# Patient Record
Sex: Male | Born: 1970
Health system: Southern US, Community
[De-identification: ages and names within clinical notes are randomized; demographics above are authoritative.]

## PROBLEM LIST (undated history)

## (undated) DIAGNOSIS — C801 Malignant (primary) neoplasm, unspecified: Secondary | ICD-10-CM

## (undated) DIAGNOSIS — I209 Angina pectoris, unspecified: Secondary | ICD-10-CM

## (undated) DIAGNOSIS — F32A Depression, unspecified: Secondary | ICD-10-CM

## (undated) DIAGNOSIS — E119 Type 2 diabetes mellitus without complications: Secondary | ICD-10-CM

## (undated) DIAGNOSIS — Z95810 Presence of automatic (implantable) cardiac defibrillator: Secondary | ICD-10-CM

## (undated) DIAGNOSIS — I2510 Atherosclerotic heart disease of native coronary artery without angina pectoris: Secondary | ICD-10-CM

## (undated) DIAGNOSIS — M1990 Unspecified osteoarthritis, unspecified site: Secondary | ICD-10-CM

## (undated) DIAGNOSIS — E669 Obesity, unspecified: Secondary | ICD-10-CM

## (undated) DIAGNOSIS — F329 Major depressive disorder, single episode, unspecified: Secondary | ICD-10-CM

## (undated) DIAGNOSIS — I219 Acute myocardial infarction, unspecified: Secondary | ICD-10-CM

## (undated) DIAGNOSIS — E785 Hyperlipidemia, unspecified: Secondary | ICD-10-CM

## (undated) DIAGNOSIS — I10 Essential (primary) hypertension: Secondary | ICD-10-CM

## (undated) DIAGNOSIS — I5022 Chronic systolic (congestive) heart failure: Secondary | ICD-10-CM

## (undated) DIAGNOSIS — I509 Heart failure, unspecified: Secondary | ICD-10-CM

## (undated) DIAGNOSIS — M109 Gout, unspecified: Secondary | ICD-10-CM

## (undated) DIAGNOSIS — G4730 Sleep apnea, unspecified: Secondary | ICD-10-CM

## (undated) DIAGNOSIS — F419 Anxiety disorder, unspecified: Secondary | ICD-10-CM

## (undated) DIAGNOSIS — I480 Paroxysmal atrial fibrillation: Secondary | ICD-10-CM

## (undated) DIAGNOSIS — R0602 Shortness of breath: Secondary | ICD-10-CM

## (undated) DIAGNOSIS — M11261 Other chondrocalcinosis, right knee: Secondary | ICD-10-CM

## (undated) DIAGNOSIS — K760 Fatty (change of) liver, not elsewhere classified: Secondary | ICD-10-CM

## (undated) DIAGNOSIS — E66811 Obesity, class 1: Secondary | ICD-10-CM

## (undated) DIAGNOSIS — I420 Dilated cardiomyopathy: Secondary | ICD-10-CM

## (undated) DIAGNOSIS — Z950 Presence of cardiac pacemaker: Secondary | ICD-10-CM

## (undated) DIAGNOSIS — F319 Bipolar disorder, unspecified: Secondary | ICD-10-CM

## (undated) DIAGNOSIS — Z9289 Personal history of other medical treatment: Secondary | ICD-10-CM

## (undated) DIAGNOSIS — K279 Peptic ulcer, site unspecified, unspecified as acute or chronic, without hemorrhage or perforation: Secondary | ICD-10-CM

## (undated) DIAGNOSIS — K219 Gastro-esophageal reflux disease without esophagitis: Secondary | ICD-10-CM

## (undated) DIAGNOSIS — Z87442 Personal history of urinary calculi: Secondary | ICD-10-CM

## (undated) DIAGNOSIS — I499 Cardiac arrhythmia, unspecified: Secondary | ICD-10-CM

## (undated) HISTORY — PX: JOINT REPLACEMENT: SHX530

## (undated) HISTORY — DX: Gout, unspecified: M10.9

## (undated) HISTORY — DX: Atherosclerotic heart disease of native coronary artery without angina pectoris: I25.10

## (undated) HISTORY — PX: CARDIAC PACEMAKER PLACEMENT: SHX583

## (undated) HISTORY — PX: TOTAL KNEE ARTHROPLASTY: SHX125

## (undated) HISTORY — DX: Hyperlipidemia, unspecified: E78.5

## (undated) HISTORY — DX: Essential (primary) hypertension: I10

## (undated) HISTORY — PX: CHOLECYSTECTOMY: SHX55

## (undated) HISTORY — PX: CARDIAC CATHETERIZATION: SHX172

## (undated) HISTORY — PX: COLONOSCOPY: SHX174

## (undated) HISTORY — PX: CORONARY ARTERY BYPASS GRAFT: SHX141

## (undated) HISTORY — DX: Dilated cardiomyopathy: I42.0

## (undated) HISTORY — DX: Type 2 diabetes mellitus without complications: E11.9

## (undated) HISTORY — DX: Personal history of other medical treatment: Z92.89

---

## 1999-06-30 ENCOUNTER — Emergency Department (HOSPITAL_COMMUNITY): Admission: EM | Admit: 1999-06-30 | Discharge: 1999-06-30 | Payer: Self-pay | Admitting: Emergency Medicine

## 2000-08-17 ENCOUNTER — Encounter: Payer: Self-pay | Admitting: Emergency Medicine

## 2000-08-17 ENCOUNTER — Inpatient Hospital Stay (HOSPITAL_COMMUNITY): Admission: EM | Admit: 2000-08-17 | Discharge: 2000-08-19 | Payer: Self-pay | Admitting: Emergency Medicine

## 2001-04-11 ENCOUNTER — Emergency Department (HOSPITAL_COMMUNITY): Admission: EM | Admit: 2001-04-11 | Discharge: 2001-04-12 | Payer: Self-pay | Admitting: Emergency Medicine

## 2001-05-26 ENCOUNTER — Encounter: Payer: Self-pay | Admitting: Emergency Medicine

## 2001-05-26 ENCOUNTER — Emergency Department (HOSPITAL_COMMUNITY): Admission: EM | Admit: 2001-05-26 | Discharge: 2001-05-26 | Payer: Self-pay | Admitting: Emergency Medicine

## 2001-06-14 ENCOUNTER — Emergency Department (HOSPITAL_COMMUNITY): Admission: EM | Admit: 2001-06-14 | Discharge: 2001-06-14 | Payer: Self-pay | Admitting: Emergency Medicine

## 2001-06-24 ENCOUNTER — Emergency Department (HOSPITAL_COMMUNITY): Admission: EM | Admit: 2001-06-24 | Discharge: 2001-06-24 | Payer: Self-pay | Admitting: Emergency Medicine

## 2001-06-24 ENCOUNTER — Encounter: Payer: Self-pay | Admitting: Emergency Medicine

## 2001-07-17 ENCOUNTER — Emergency Department (HOSPITAL_COMMUNITY): Admission: EM | Admit: 2001-07-17 | Discharge: 2001-07-17 | Payer: Self-pay | Admitting: Emergency Medicine

## 2001-07-18 ENCOUNTER — Emergency Department (HOSPITAL_COMMUNITY): Admission: EM | Admit: 2001-07-18 | Discharge: 2001-07-19 | Payer: Self-pay | Admitting: Emergency Medicine

## 2004-10-14 ENCOUNTER — Emergency Department (HOSPITAL_COMMUNITY): Admission: EM | Admit: 2004-10-14 | Discharge: 2004-10-14 | Payer: Self-pay | Admitting: Emergency Medicine

## 2004-10-23 ENCOUNTER — Emergency Department (HOSPITAL_COMMUNITY): Admission: EM | Admit: 2004-10-23 | Discharge: 2004-10-23 | Payer: Self-pay | Admitting: Emergency Medicine

## 2004-10-24 ENCOUNTER — Emergency Department (HOSPITAL_COMMUNITY): Admission: EM | Admit: 2004-10-24 | Discharge: 2004-10-24 | Payer: Self-pay | Admitting: Emergency Medicine

## 2004-11-13 ENCOUNTER — Emergency Department (HOSPITAL_COMMUNITY): Admission: EM | Admit: 2004-11-13 | Discharge: 2004-11-13 | Payer: Self-pay | Admitting: Emergency Medicine

## 2005-01-28 DIAGNOSIS — Z95810 Presence of automatic (implantable) cardiac defibrillator: Secondary | ICD-10-CM

## 2005-01-28 HISTORY — DX: Presence of automatic (implantable) cardiac defibrillator: Z95.810

## 2007-06-22 ENCOUNTER — Emergency Department (HOSPITAL_COMMUNITY): Admission: EM | Admit: 2007-06-22 | Discharge: 2007-06-22 | Payer: Self-pay | Admitting: Emergency Medicine

## 2008-03-08 ENCOUNTER — Inpatient Hospital Stay (HOSPITAL_COMMUNITY): Admission: EM | Admit: 2008-03-08 | Discharge: 2008-03-11 | Payer: Self-pay | Admitting: Emergency Medicine

## 2008-03-08 ENCOUNTER — Ambulatory Visit: Payer: Self-pay | Admitting: Internal Medicine

## 2008-03-08 ENCOUNTER — Ambulatory Visit: Payer: Self-pay | Admitting: Pulmonary Disease

## 2008-03-08 ENCOUNTER — Encounter (INDEPENDENT_AMBULATORY_CARE_PROVIDER_SITE_OTHER): Payer: Self-pay | Admitting: Internal Medicine

## 2008-03-09 ENCOUNTER — Encounter (INDEPENDENT_AMBULATORY_CARE_PROVIDER_SITE_OTHER): Payer: Self-pay | Admitting: *Deleted

## 2008-03-09 ENCOUNTER — Ambulatory Visit: Payer: Self-pay | Admitting: Vascular Surgery

## 2008-03-10 ENCOUNTER — Encounter (INDEPENDENT_AMBULATORY_CARE_PROVIDER_SITE_OTHER): Payer: Self-pay | Admitting: *Deleted

## 2008-03-10 LAB — CONVERTED CEMR LAB: Hgb A1c MFr Bld: 9.1 %

## 2008-03-11 ENCOUNTER — Encounter (INDEPENDENT_AMBULATORY_CARE_PROVIDER_SITE_OTHER): Payer: Self-pay | Admitting: Internal Medicine

## 2008-03-11 DIAGNOSIS — I5022 Chronic systolic (congestive) heart failure: Secondary | ICD-10-CM

## 2008-03-11 DIAGNOSIS — R578 Other shock: Secondary | ICD-10-CM

## 2008-03-11 DIAGNOSIS — I10 Essential (primary) hypertension: Secondary | ICD-10-CM

## 2008-03-11 DIAGNOSIS — M109 Gout, unspecified: Secondary | ICD-10-CM

## 2008-03-11 DIAGNOSIS — E785 Hyperlipidemia, unspecified: Secondary | ICD-10-CM

## 2008-03-11 DIAGNOSIS — R570 Cardiogenic shock: Secondary | ICD-10-CM | POA: Insufficient documentation

## 2008-03-14 ENCOUNTER — Encounter (INDEPENDENT_AMBULATORY_CARE_PROVIDER_SITE_OTHER): Payer: Self-pay | Admitting: Internal Medicine

## 2008-03-14 ENCOUNTER — Ambulatory Visit: Payer: Self-pay | Admitting: Internal Medicine

## 2008-03-15 ENCOUNTER — Encounter (INDEPENDENT_AMBULATORY_CARE_PROVIDER_SITE_OTHER): Payer: Self-pay | Admitting: Internal Medicine

## 2008-03-15 ENCOUNTER — Ambulatory Visit: Payer: Self-pay | Admitting: Internal Medicine

## 2008-03-15 DIAGNOSIS — E872 Acidosis, unspecified: Secondary | ICD-10-CM | POA: Insufficient documentation

## 2008-03-15 LAB — CONVERTED CEMR LAB
BUN: 19 mg/dL (ref 6–23)
Calcium: 10.1 mg/dL (ref 8.4–10.5)
Creatinine, Ser: 0.81 mg/dL (ref 0.40–1.50)
Creatinine, Ser: 0.87 mg/dL (ref 0.40–1.50)
Digitoxin Lvl: 1 ng/mL (ref 0.8–2.0)
Glucose, Bld: 148 mg/dL — ABNORMAL HIGH (ref 70–99)
Sodium: 136 meq/L (ref 135–145)

## 2008-03-18 ENCOUNTER — Ambulatory Visit: Payer: Self-pay | Admitting: Internal Medicine

## 2008-03-18 ENCOUNTER — Encounter (INDEPENDENT_AMBULATORY_CARE_PROVIDER_SITE_OTHER): Payer: Self-pay | Admitting: Internal Medicine

## 2008-03-18 LAB — CONVERTED CEMR LAB
Blood Glucose, Fingerstick: 170
CO2: 22 meq/L (ref 19–32)
Sodium: 140 meq/L (ref 135–145)

## 2008-03-23 ENCOUNTER — Ambulatory Visit: Payer: Self-pay | Admitting: Internal Medicine

## 2008-03-25 ENCOUNTER — Emergency Department (HOSPITAL_COMMUNITY): Admission: EM | Admit: 2008-03-25 | Discharge: 2008-03-25 | Payer: Self-pay | Admitting: Emergency Medicine

## 2008-03-28 ENCOUNTER — Encounter (INDEPENDENT_AMBULATORY_CARE_PROVIDER_SITE_OTHER): Payer: Self-pay | Admitting: Internal Medicine

## 2008-03-28 ENCOUNTER — Emergency Department (HOSPITAL_COMMUNITY): Admission: EM | Admit: 2008-03-28 | Discharge: 2008-03-28 | Payer: Self-pay | Admitting: Emergency Medicine

## 2008-03-28 ENCOUNTER — Telehealth: Payer: Self-pay | Admitting: *Deleted

## 2008-03-29 DIAGNOSIS — I319 Disease of pericardium, unspecified: Secondary | ICD-10-CM | POA: Insufficient documentation

## 2008-03-29 DIAGNOSIS — R319 Hematuria, unspecified: Secondary | ICD-10-CM

## 2008-03-30 ENCOUNTER — Ambulatory Visit: Payer: Self-pay | Admitting: Internal Medicine

## 2008-03-30 ENCOUNTER — Ambulatory Visit: Payer: Self-pay | Admitting: *Deleted

## 2008-03-30 ENCOUNTER — Encounter (INDEPENDENT_AMBULATORY_CARE_PROVIDER_SITE_OTHER): Payer: Self-pay | Admitting: Internal Medicine

## 2008-03-30 ENCOUNTER — Encounter (INDEPENDENT_AMBULATORY_CARE_PROVIDER_SITE_OTHER): Payer: Self-pay | Admitting: *Deleted

## 2008-03-30 LAB — CONVERTED CEMR LAB
Blood Glucose, Fingerstick: 169
Blood Glucose, Home Monitor: 1 mg/dL

## 2008-03-31 LAB — CONVERTED CEMR LAB
Cholesterol: 198 mg/dL (ref 0–200)
HDL: 8 mg/dL — ABNORMAL LOW (ref >39)
Total CHOL/HDL Ratio: 24.8
VLDL: 51 mg/dL — ABNORMAL HIGH (ref 0–40)

## 2008-04-04 ENCOUNTER — Encounter: Payer: Self-pay | Admitting: Internal Medicine

## 2008-05-02 ENCOUNTER — Telehealth (INDEPENDENT_AMBULATORY_CARE_PROVIDER_SITE_OTHER): Payer: Self-pay | Admitting: Internal Medicine

## 2008-06-07 ENCOUNTER — Telehealth: Payer: Self-pay | Admitting: Internal Medicine

## 2008-06-24 ENCOUNTER — Encounter: Payer: Self-pay | Admitting: Internal Medicine

## 2008-08-18 ENCOUNTER — Telehealth (INDEPENDENT_AMBULATORY_CARE_PROVIDER_SITE_OTHER): Payer: Self-pay | Admitting: Internal Medicine

## 2008-08-22 ENCOUNTER — Encounter: Payer: Self-pay | Admitting: Internal Medicine

## 2008-09-06 ENCOUNTER — Telehealth: Payer: Self-pay | Admitting: *Deleted

## 2008-09-13 ENCOUNTER — Ambulatory Visit: Payer: Self-pay | Admitting: Internal Medicine

## 2008-09-13 LAB — CONVERTED CEMR LAB
BUN: 11 mg/dL (ref 6–23)
Blood Glucose, AC Bkfst: 378 mg/dL
CO2: 29 meq/L (ref 19–32)
Calcium: 8.7 mg/dL (ref 8.4–10.5)
Glucose, Bld: 376 mg/dL — ABNORMAL HIGH (ref 70–99)

## 2008-09-23 ENCOUNTER — Ambulatory Visit: Payer: Self-pay | Admitting: Internal Medicine

## 2008-09-23 LAB — CONVERTED CEMR LAB: Blood Glucose, Fingerstick: 336

## 2008-09-27 ENCOUNTER — Encounter (INDEPENDENT_AMBULATORY_CARE_PROVIDER_SITE_OTHER): Payer: Self-pay | Admitting: *Deleted

## 2008-10-27 ENCOUNTER — Encounter (INDEPENDENT_AMBULATORY_CARE_PROVIDER_SITE_OTHER): Payer: Self-pay | Admitting: *Deleted

## 2008-11-10 ENCOUNTER — Ambulatory Visit: Payer: Self-pay | Admitting: Internal Medicine

## 2008-11-10 ENCOUNTER — Encounter (INDEPENDENT_AMBULATORY_CARE_PROVIDER_SITE_OTHER): Payer: Self-pay | Admitting: Internal Medicine

## 2008-11-10 LAB — CONVERTED CEMR LAB
Blood Glucose, Fingerstick: 396
Blood Glucose, Home Monitor: 5 mg/dL

## 2008-11-11 ENCOUNTER — Telehealth (INDEPENDENT_AMBULATORY_CARE_PROVIDER_SITE_OTHER): Payer: Self-pay | Admitting: Internal Medicine

## 2008-11-11 DIAGNOSIS — E781 Pure hyperglyceridemia: Secondary | ICD-10-CM

## 2008-11-11 LAB — CONVERTED CEMR LAB
Alkaline Phosphatase: 102 units/L (ref 39–117)
Calcium: 8.9 mg/dL (ref 8.4–10.5)
Chloride: 99 meq/L (ref 96–112)
Creatinine, Ser: 0.93 mg/dL (ref 0.40–1.50)
HDL: 26 mg/dL — ABNORMAL LOW (ref >39)
Indirect Bilirubin: 0.5 mg/dL (ref 0.0–0.9)
Total CHOL/HDL Ratio: 7
Total Protein: 7.3 g/dL (ref 6.0–8.3)
Triglycerides: 510 mg/dL — ABNORMAL HIGH (ref <150)

## 2008-11-16 DIAGNOSIS — O99810 Abnormal glucose complicating pregnancy: Secondary | ICD-10-CM | POA: Insufficient documentation

## 2008-11-16 DIAGNOSIS — I2510 Atherosclerotic heart disease of native coronary artery without angina pectoris: Secondary | ICD-10-CM | POA: Insufficient documentation

## 2008-11-22 ENCOUNTER — Telehealth (INDEPENDENT_AMBULATORY_CARE_PROVIDER_SITE_OTHER): Payer: Self-pay | Admitting: *Deleted

## 2008-11-23 ENCOUNTER — Telehealth: Payer: Self-pay | Admitting: Internal Medicine

## 2008-11-23 ENCOUNTER — Encounter: Payer: Self-pay | Admitting: Internal Medicine

## 2008-11-24 ENCOUNTER — Ambulatory Visit: Payer: Self-pay | Admitting: Internal Medicine

## 2008-11-24 ENCOUNTER — Encounter (INDEPENDENT_AMBULATORY_CARE_PROVIDER_SITE_OTHER): Payer: Self-pay | Admitting: Internal Medicine

## 2008-11-25 DIAGNOSIS — Z95810 Presence of automatic (implantable) cardiac defibrillator: Secondary | ICD-10-CM | POA: Insufficient documentation

## 2008-11-28 ENCOUNTER — Telehealth (INDEPENDENT_AMBULATORY_CARE_PROVIDER_SITE_OTHER): Payer: Self-pay | Admitting: *Deleted

## 2008-12-02 ENCOUNTER — Ambulatory Visit (HOSPITAL_COMMUNITY): Admission: RE | Admit: 2008-12-02 | Discharge: 2008-12-02 | Payer: Self-pay | Admitting: Internal Medicine

## 2008-12-02 ENCOUNTER — Ambulatory Visit: Payer: Self-pay | Admitting: Cardiology

## 2008-12-02 ENCOUNTER — Ambulatory Visit: Payer: Self-pay

## 2008-12-02 ENCOUNTER — Encounter: Payer: Self-pay | Admitting: Internal Medicine

## 2008-12-05 ENCOUNTER — Ambulatory Visit: Payer: Self-pay | Admitting: Internal Medicine

## 2008-12-05 DIAGNOSIS — I255 Ischemic cardiomyopathy: Secondary | ICD-10-CM

## 2008-12-05 LAB — CONVERTED CEMR LAB
Blood Glucose, Fingerstick: 397
Hgb A1c MFr Bld: 10.7 %

## 2008-12-13 ENCOUNTER — Telehealth: Payer: Self-pay | Admitting: Internal Medicine

## 2009-01-28 DIAGNOSIS — K760 Fatty (change of) liver, not elsewhere classified: Secondary | ICD-10-CM

## 2009-01-28 HISTORY — DX: Fatty (change of) liver, not elsewhere classified: K76.0

## 2009-02-21 ENCOUNTER — Encounter (INDEPENDENT_AMBULATORY_CARE_PROVIDER_SITE_OTHER): Payer: Self-pay | Admitting: Internal Medicine

## 2009-02-21 ENCOUNTER — Ambulatory Visit: Payer: Self-pay | Admitting: Internal Medicine

## 2009-02-21 ENCOUNTER — Inpatient Hospital Stay (HOSPITAL_COMMUNITY): Admission: EM | Admit: 2009-02-21 | Discharge: 2009-02-23 | Payer: Self-pay | Admitting: Emergency Medicine

## 2009-02-21 ENCOUNTER — Ambulatory Visit: Payer: Self-pay | Admitting: Cardiology

## 2009-02-22 ENCOUNTER — Encounter: Payer: Self-pay | Admitting: Internal Medicine

## 2009-02-25 ENCOUNTER — Encounter (INDEPENDENT_AMBULATORY_CARE_PROVIDER_SITE_OTHER): Payer: Self-pay | Admitting: Internal Medicine

## 2009-03-01 ENCOUNTER — Telehealth: Payer: Self-pay | Admitting: Internal Medicine

## 2009-03-28 ENCOUNTER — Telehealth (INDEPENDENT_AMBULATORY_CARE_PROVIDER_SITE_OTHER): Payer: Self-pay | Admitting: Internal Medicine

## 2009-04-28 ENCOUNTER — Telehealth (INDEPENDENT_AMBULATORY_CARE_PROVIDER_SITE_OTHER): Payer: Self-pay | Admitting: Internal Medicine

## 2009-06-02 ENCOUNTER — Emergency Department (HOSPITAL_COMMUNITY): Admission: EM | Admit: 2009-06-02 | Discharge: 2009-06-02 | Payer: Self-pay | Admitting: Emergency Medicine

## 2009-06-19 ENCOUNTER — Telehealth (INDEPENDENT_AMBULATORY_CARE_PROVIDER_SITE_OTHER): Payer: Self-pay | Admitting: *Deleted

## 2009-06-21 ENCOUNTER — Telehealth: Payer: Self-pay | Admitting: *Deleted

## 2009-08-24 ENCOUNTER — Inpatient Hospital Stay (HOSPITAL_COMMUNITY): Admission: EM | Admit: 2009-08-24 | Discharge: 2009-08-28 | Payer: Self-pay | Admitting: Emergency Medicine

## 2009-08-24 ENCOUNTER — Ambulatory Visit: Payer: Self-pay | Admitting: Internal Medicine

## 2009-09-01 ENCOUNTER — Ambulatory Visit: Payer: Self-pay | Admitting: Cardiovascular Disease

## 2009-09-01 ENCOUNTER — Telehealth: Payer: Self-pay | Admitting: Internal Medicine

## 2009-09-01 LAB — CONVERTED CEMR LAB: POC INR: 3

## 2009-09-05 ENCOUNTER — Telehealth: Payer: Self-pay | Admitting: Internal Medicine

## 2009-09-05 ENCOUNTER — Emergency Department (HOSPITAL_COMMUNITY): Admission: EM | Admit: 2009-09-05 | Discharge: 2009-09-05 | Payer: Self-pay | Admitting: Emergency Medicine

## 2009-09-06 ENCOUNTER — Ambulatory Visit: Payer: Self-pay | Admitting: Internal Medicine

## 2009-09-08 ENCOUNTER — Ambulatory Visit: Payer: Self-pay | Admitting: Internal Medicine

## 2009-09-08 LAB — CONVERTED CEMR LAB: POC INR: 4.8

## 2009-09-11 ENCOUNTER — Telehealth: Payer: Self-pay | Admitting: *Deleted

## 2009-09-13 ENCOUNTER — Ambulatory Visit: Payer: Self-pay | Admitting: Internal Medicine

## 2009-09-13 LAB — CONVERTED CEMR LAB
Calcium: 9 mg/dL (ref 8.4–10.5)
Potassium: 4 meq/L (ref 3.5–5.3)
Sodium: 134 meq/L — ABNORMAL LOW (ref 135–145)

## 2009-09-14 ENCOUNTER — Telehealth: Payer: Self-pay | Admitting: Internal Medicine

## 2009-09-15 ENCOUNTER — Ambulatory Visit: Payer: Self-pay | Admitting: Cardiology

## 2009-09-15 LAB — CONVERTED CEMR LAB: POC INR: 2.2

## 2009-09-22 ENCOUNTER — Ambulatory Visit: Payer: Self-pay | Admitting: Cardiology

## 2009-09-22 LAB — CONVERTED CEMR LAB: POC INR: 2.1

## 2009-10-05 ENCOUNTER — Encounter: Payer: Self-pay | Admitting: Internal Medicine

## 2009-10-05 ENCOUNTER — Ambulatory Visit: Payer: Self-pay | Admitting: Internal Medicine

## 2009-10-05 ENCOUNTER — Telehealth: Payer: Self-pay | Admitting: Internal Medicine

## 2009-10-12 ENCOUNTER — Ambulatory Visit: Payer: Self-pay | Admitting: Internal Medicine

## 2009-10-12 ENCOUNTER — Ambulatory Visit: Payer: Self-pay | Admitting: Cardiology

## 2009-10-12 DIAGNOSIS — M109 Gout, unspecified: Secondary | ICD-10-CM | POA: Insufficient documentation

## 2009-10-12 LAB — CONVERTED CEMR LAB
Hgb A1c MFr Bld: 10.5 %
POC INR: 3.2

## 2009-10-19 ENCOUNTER — Ambulatory Visit: Payer: Self-pay | Admitting: Internal Medicine

## 2009-10-19 DIAGNOSIS — E669 Obesity, unspecified: Secondary | ICD-10-CM | POA: Insufficient documentation

## 2009-10-19 LAB — HM DIABETES FOOT EXAM

## 2009-10-20 LAB — CONVERTED CEMR LAB
ALT: 90 units/L — ABNORMAL HIGH (ref 0–53)
Albumin: 4.1 g/dL (ref 3.5–5.2)
Alkaline Phosphatase: 75 units/L (ref 39–117)
Glucose, Bld: 159 mg/dL — ABNORMAL HIGH (ref 70–99)
Potassium: 3.7 meq/L (ref 3.5–5.3)
Sodium: 138 meq/L (ref 135–145)
Testosterone: 205.17 ng/dL — ABNORMAL LOW (ref 350–890)
Total Protein: 6.2 g/dL (ref 6.0–8.3)
Triglycerides: 944 mg/dL — ABNORMAL HIGH (ref <150)

## 2009-10-25 ENCOUNTER — Ambulatory Visit: Payer: Self-pay | Admitting: Internal Medicine

## 2009-10-25 LAB — CONVERTED CEMR LAB: POC INR: 2.8

## 2009-12-13 ENCOUNTER — Inpatient Hospital Stay (HOSPITAL_COMMUNITY): Admission: EM | Admit: 2009-12-13 | Discharge: 2009-12-16 | Payer: Self-pay | Admitting: Emergency Medicine

## 2009-12-13 ENCOUNTER — Ambulatory Visit: Payer: Self-pay | Admitting: Cardiology

## 2009-12-18 ENCOUNTER — Telehealth: Payer: Self-pay | Admitting: Internal Medicine

## 2009-12-18 ENCOUNTER — Ambulatory Visit: Payer: Self-pay | Admitting: Cardiovascular Disease

## 2009-12-18 ENCOUNTER — Ambulatory Visit: Payer: Self-pay | Admitting: Internal Medicine

## 2009-12-18 DIAGNOSIS — I4891 Unspecified atrial fibrillation: Secondary | ICD-10-CM | POA: Insufficient documentation

## 2009-12-18 LAB — CONVERTED CEMR LAB
CO2: 28 meq/L (ref 19–32)
Calcium: 8.9 mg/dL (ref 8.4–10.5)
GFR calc non Af Amer: 89.61 mL/min (ref >60)
POC INR: 3.3
Sodium: 138 meq/L (ref 135–145)

## 2009-12-19 ENCOUNTER — Encounter: Payer: Self-pay | Admitting: Internal Medicine

## 2010-01-16 ENCOUNTER — Ambulatory Visit: Payer: Self-pay

## 2010-01-16 ENCOUNTER — Ambulatory Visit: Payer: Self-pay | Admitting: Internal Medicine

## 2010-02-05 ENCOUNTER — Encounter (INDEPENDENT_AMBULATORY_CARE_PROVIDER_SITE_OTHER): Payer: Self-pay | Admitting: *Deleted

## 2010-02-22 ENCOUNTER — Telehealth: Payer: Self-pay | Admitting: *Deleted

## 2010-02-26 ENCOUNTER — Telehealth: Payer: Self-pay | Admitting: Internal Medicine

## 2010-03-01 NOTE — Consult Note (Signed)
Summary: [facility]   Imported By: Sallee Provencal 02/24/2009 14:28:18  _____________________________________________________________________  External Attachment:    Type:   Image     Comment:   External Document

## 2010-03-01 NOTE — Progress Notes (Signed)
Summary: pt having fluid buildup in chest-discomfort / emr per kelly  Phone Note Call from Patient   Caller: Patient Reason for Call: Talk to Nurse Summary of Call: pt started having a fluid build up in chest, coughing a lot, legs are puffy, chest discomfort-was in hospital and dc on 8-1 due to difibulator shocking him-pls call [phone] Initial call taken by: Lorenda Hatchet,  September 05, 2009 10:56 AM  Follow-up for Phone Call        per pt calling back . pt went to [employer] was walking around with sob. call spoke with nurse [name]  advise me to inform pt to go to the  nearest [place]  September 05, 2009 11:33 AM

## 2010-03-01 NOTE — Assessment & Plan Note (Signed)
Summary: ACUTE/YANG/1 WEEK RECHECK AND FASTING LABS/[facility]   Vital Signs:  Patient profile:   40 year old male Height:      75 inches (190.50 cm) Weight:      270.02 pounds (122.74 kg) BMI:     33.87 Temp:     97.6 degrees F (36.44 degrees C) oral Pulse rate:   81 / minute BP sitting:   124 / 84  (left arm)  Vitals Entered By: Sander Nephew RN (October 19, 2009 8:41 AM) CC: Getting a new meter today.  Needs authorization on [employer].  Labs and check up. Is Patient Diabetic? Yes Did you bring your meter with you today? No Pain Assessment Patient in pain? yes     Location: ankles Intensity: 3 Type: aching Onset of pain  Has Gout. Nutritional Status BMI of > 30 = obese  Have you ever been in a relationship where you felt threatened, hurt or afraid?No   Does patient need assistance? Functional Status Self care Comments Getting a new meter today.  Needs authorization on [employer].  Labs and check up.   Primary Care Provider:  Geanie Kenning MD  CC:  Getting a new meter today.  Needs authorization on [employer].  Labs and check up.Marland Kitchen  History of Present Illness: Patient is a 40 year old male with a past medical history of dilated nonischemic cardiomyopathy with an EF of 15%, diabetes mellitus, with an A1c of 10.5, hyperlipidemia, and gout presents to the clinic for follow up of after gout flair and reevaluation of Insulin dosage.   1. Aut Gout: was seen last week and was  prescribed colchicine q1 hours . Improved, pressure is gone but pt still complains about  some discomfort.  2. DM: CBG 159 fasting, Hgb A1c last visit 10.5 currently on Lantus 25 units ad bedtime and  Novolog 10 units before meals three times a day. Diet: controlling . Not able to do much exercise since pain in  his knee and feet due to gout.  3. HDL: today lipid panel  Patient is feeling well and denies CP, abdominal pain, nausea, vomiting, HA's, palpitations, blurred vision. fever, chills, diarrhea, constipation or  SOB.   Depression History:      The patient denies a depressed mood most of the day and a diminished interest in his usual daily activities.         Current Medications (verified): 1)  Digoxin 0.25 Mg Tabs (Digoxin) .... Take 1 Tablet By Mouth Once A Day 2)  Furosemide 10 Mg/ml Soln (Furosemide) .... Take 1 Tablet By Mouth Once A Day 3)  Prinivil 10 Mg Tabs (Lisinopril) .... Take 1 Tablet By Mouth Once A Day 4)  Colchicine 0.6 Mg Tabs (Colchicine) .... Take One Tablet By Mouth Once A Day 5)  Pravachol 40 Mg Tabs (Pravastatin Sodium) .... Take 1 Tablet By Mouth Once A Day in The Evening 6)  Meloxicam 15 Mg Tabs (Meloxicam) .... Take 1 Tablet By Mouth Once A Day 7)  Lantus Solostar 100 Unit/ml Soln (Insulin Glargine) .... 25 Units Once A Day, Subcutaneous 8)  Bd Pen Needle Short U/f 31g X 8 Mm Misc (Insulin Pen Needle) .... Use To Inject Insulin 4  Times A Day. Diagnosis Code 250 9)  Prodigy Lancets 26g  Misc (Lancets) .... Use To Check Blood Sugar 5  Times A Day. 10)  Novolog Flexpen 100 Unit/ml Soln (Insulin Aspart) .... Inject 10 Units 15 Minutes Before Meals Three Times Daily 11)  Coumadin 10 Mg Tabs (Warfarin  Sodium) .... As Instructed 12)  Prodigy Preferred Monitor  Devi (Blood Glucose Monitoring Suppl) .... As Instructed 13)  Prodigy No Coding Blood Gluc  Strp (Glucose Blood) .... Check Cbg Three Times A Day 14)  Metoprolol Succinate 100 Mg Xr24h-Tab (Metoprolol Succinate)  Allergies: 1)  ! Penicillin 2)  ! * Oranges  Review of Systems       As per HPI.  Physical Exam  General:  Well-developed,well-nourished,in no acute distress; alert,appropriate and cooperative throughout examination Lungs:  Normal respiratory effort, chest expands symmetrically. Lungs are clear to auscultation, no crackles or wheezes. Heart:  Normal rate and regular rhythm. S1 and S2 normal without gallop, murmur, click, rub or other extra sounds. Abdomen:  Bowel sounds positive,abdomen soft and non-tender  without masses, organomegaly or hernias noted. Extremities:  No clubbing, cyanosis, edema, but  deformity Metatarsal-phalangeal joint more on the right then left, slight decreased range of motion in this joints but otherwise within normal limits.  Diabetes Management Exam:    Foot Exam (with socks and/or shoes not present):       Sensory-Monofilament:          Left foot: normal          Right foot: normal   Impression & Recommendations:  Problem # 1:  GOUT, UNSPECIFIED (ICD-274.9) Acute gout flair improved. Patient currently on Colchicine 0.6 once a day.  Patient was prescribed by [facility] on [id number] urolic for 40 mg once a day. for 2 weeks and then increase to 80 mg once a day. He was not taking it since he was not able to receive it without any pre-authorization. Patient was advised to decrease alcohol intake and control his weight which reduce gout attacks.   His updated medication list for this problem includes:    Colchicine 0.6 Mg Tabs (Colchicine) .Marland Kitchen... Take one tablet by mouth once a day  Orders: T-Uric Acid (Blood) [id number])  Problem # 2:  AODM (ICD-250.00) Today CBG 159 fasting, Hgb A1c last visit 10.5 currently on Lantus 25 units ad bedtime and  Novolog 10 units before meals three times a day. Diet: pt noted he did change is diet with more Vegetable and Fruits and less fatty food.  Does not do much of  exercise since pain in his knee and feet due to gout.  Increased lantus to 30 units once a day at bedtime and recommended patient to check his glucose level on  a regular basis. Control diet. Will follow up in 1 month for reevaluation .   His updated medication list for this problem includes:    Prinivil 10 Mg Tabs (Lisinopril) .Marland Kitchen... Take 1 tablet by mouth once a day    Lantus Solostar 100 Unit/ml Soln (Insulin glargine) .Marland Kitchen... 25 units once a day, subcutaneous    Novolog Flexpen 100 Unit/ml Soln (Insulin aspart) ..... Inject 10 units 15 minutes before meals three  times daily  Orders: T- Capillary Blood Glucose [id number])  Problem # 3:  HYPERTRIGLYCERIDEMIA (ICD-272.1) Will check Lipid panel today and adjust accordingly the med regimen.   His updated medication list for this problem includes:    Pravachol 40 Mg Tabs (Pravastatin sodium) .Marland Kitchen... Take 1 tablet by mouth once a day in the evening  Complete Medication List: 1)  Digoxin 0.25 Mg Tabs (Digoxin) .... Take 1 tablet by mouth once a day 2)  Furosemide 10 Mg/ml Soln (Furosemide) .... Take 1 tablet by mouth once a day 3)  Prinivil 10 Mg Tabs (Lisinopril) .... Take  1 tablet by mouth once a day 4)  Colchicine 0.6 Mg Tabs (Colchicine) .... Take one tablet by mouth once a day 5)  Pravachol 40 Mg Tabs (Pravastatin sodium) .... Take 1 tablet by mouth once a day in the evening 6)  Meloxicam 15 Mg Tabs (Meloxicam) .... Take 1 tablet by mouth once a day 7)  Lantus Solostar 100 Unit/ml Soln (Insulin glargine) .... 25 units once a day, subcutaneous 8)  Bd Pen Needle Short U/f 31g X 8 Mm Misc (Insulin pen needle) .... Use to inject insulin 4  times a day. diagnosis code 250 9)  Prodigy Lancets 26g Misc (Lancets) .... Use to check blood sugar 5  times a day. 10)  Novolog Flexpen 100 Unit/ml Soln (Insulin aspart) .... Inject 10 units 15 minutes before meals three times daily 11)  Coumadin 10 Mg Tabs (Warfarin sodium) .... As instructed 12)  Prodigy Preferred Monitor Devi (Blood glucose monitoring suppl) .... As instructed 13)  Prodigy No Coding Blood Gluc Strp (Glucose blood) .... Check cbg three times a day 14)  Metoprolol Succinate 100 Mg Xr24h-tab (Metoprolol succinate)  Other Orders: T-Testosterone; Total [id number])  Patient Instructions: 1)  Please schedule a follow-up appointment in 1 month. 2)  Increase lantus to 30 units once a day at bedtime. 3)  Check your blood sugars regularly. If your readings are usually above : or below 70 you should contact our office. 4)  Limit your Sodium (Salt). 5)  To  prevent gout attacks,avoid purine rich foods, such as beer, beans & peas, and meat gravies. 6)  It is important that you exercise regularly at least 20 minutes 5 times a week. If you develop chest pain, have severe difficulty breathing, or feel very tired , stop exercising immediately and seek medical attention. 7)  You need to lose weight. Consider a lower calorie diet and regular exercise.   Prevention & Chronic Care Immunizations   Influenza vaccine: Fluvax 3+  (11/24/2008)    Tetanus booster: Not documented    Pneumococcal vaccine: Not documented  Other Screening   Smoking status: never  (09/06/2009)  Diabetes Mellitus   HgbA1C: 10.7  (12/05/2008)   HgbA1C action/deferral: Ordered  (09/06/2009)    Eye exam: Not documented   Diabetic eye exam action/deferral: Ophthalmology referral  (11/10/2008)    Foot exam: yes  (10/19/2009)   Foot exam action/deferral: Do today   High risk foot: No  (10/19/2009)   Foot care education: Done  (10/19/2009)   Foot exam due: 03/16/2009    Urine microalbumin/creatinine ratio: Not documented   Urine microalbumin action/deferral: Ordered  Lipids   Total Cholesterol: 181  (11/10/2008)   LDL: * mg/dL  (11/10/2008)   LDL Direct: Not documented   HDL: 26  (11/10/2008)   Triglycerides: 510  (11/10/2008)    SGOT (AST): 31  (11/10/2008)   SGPT (ALT): 62  (11/10/2008)   Alkaline phosphatase: 102  (11/10/2008)   Total bilirubin: 0.6  (11/10/2008)  Hypertension   Last Blood Pressure: 124 / 84  (10/19/2009)   Serum creatinine: 0.83  (09/13/2009)   Serum potassium 4.0  (09/13/2009)  Self-Management Support :   Personal Goals (by the next clinic visit) :     Personal A1C goal: 7  (09/06/2009)     Personal blood pressure goal: 130/80  (09/06/2009)     Personal LDL goal: 70  (09/06/2009)    Patient will work on the following items until the next clinic visit to reach self-care goals:  Medications and monitoring: take my medicines every day,  check my blood pressure, bring all of my medications to every visit, examine my feet every day  (10/19/2009)     Eating: drink diet soda or water instead of juice or soda, eat more vegetables, use fresh or frozen vegetables, eat foods that are low in salt, eat baked foods instead of fried foods, eat fruit for snacks and desserts, limit or avoid alcohol  (10/19/2009)     Activity: take a 30 minute walk every day  (10/19/2009)    Diabetes self-management support: Written self-care plan, Education handout, Pre-printed educational material, Resources for patients handout  (10/19/2009)   Diabetes care plan printed   Diabetes education handout printed   Last diabetes self-management training by diabetes educator: 11/24/2008   Last medical nutrition therapy: 03/30/2008    Hypertension self-management support: Written self-care plan, Education handout, Pre-printed educational material, Resources for patients handout  (10/19/2009)   Hypertension self-care plan printed.   Hypertension education handout printed    Lipid self-management support: Written self-care plan, Education handout, Pre-printed educational material, Resources for patients handout  (10/19/2009)   Lipid self-care plan printed.   Lipid education handout printed      Resource handout printed.   Nursing Instructions: Diabetic foot exam today    Diabetic Foot Exam Foot Inspection Is there a history of a foot ulcer?              No Is there a foot ulcer now?              No Can the patient see the bottom of their feet?          Yes Are the shoes appropriate in style and fit?          Yes Is there swelling or an abnormal foot shape?          No Are the toenails long?                No Are the toenails thick?                No Are the toenails ingrown?              No Is there heavy callous build-up?              No  Diabetic Foot Care Education Patient educated on appropriate care of diabetic feet.   High Risk Feet?  No   10-g (5.07) Semmes-Weinstein Monofilament Test Performed by: Adline Peals          Right Foot          Left Foot Site 1         normal         normal Site 4         normal         normal Site 5         normal         normal Site 6         normal         normal  Impression      normal         normal  Process Orders Check Orders Results:     [facility]: [id number] not required for this insurance Tests Sent for requisitioning (October 19, 2009 9:45 AM):     10/19/2009: [facility] -- T-Uric Acid (Blood) [id number] (signed)     10/19/2009: [facility] -- T-Testosterone; Total [id number] (signed)     Primary Care Provider:  Geanie Kenning MD  CC:  Getting a new meter today.  Needs authorization on [employer].  Labs and check up.Marland Kitchen  History of Present Illness: Patient is a 40 year old male with a past medical history of dilated nonischemic cardiomyopathy with an EF of 15%, diabetes mellitus, with an A1c of 10.5, hyperlipidemia, and gout presents to the clinic for follow up of after gout flair and reevaluation of Insulin dosage.   1. Aut Gout: was seen last week and was  prescribed colchicine q1 hours . Improved, pressure is gone but pt still complains about  some discomfort.  2. DM: CBG 159 fasting, Hgb A1c last visit 10.5 currently on Lantus 25 units ad bedtime and  Novolog 10 units before meals three times a day. Diet: controlling . Not able to do much exercise since pain in  his knee and feet due to gout.  3. HDL: today lipid panel  Patient is feeling well and denies CP, abdominal pain, nausea, vomiting, HA's, palpitations, blurred vision. fever, chills, diarrhea, constipation or SOB.   Depression History:      The patient denies a depressed mood most of the day and a diminished interest in his usual daily activities.         Laboratory Results   Blood Tests   Date/Time Received: October 19, 2009 9:04 AM Date/Time  Reported: Maryan Rued  October 19, 2009 9:04 AM   CBG Fasting:: 159mg /dL

## 2010-03-01 NOTE — Cardiovascular Report (Signed)
Summary: Office Visit   Office Visit   Imported By: Sallee Provencal 10/10/2009 12:01:52  _____________________________________________________________________  External Attachment:    Type:   Image     Comment:   External Document

## 2010-03-01 NOTE — Progress Notes (Signed)
Summary: Prior Authorization-Meloxicam  Phone Note Outgoing Call   Call placed by: Sander Nephew RN,  February 22, 2010 10:38 AM Call placed to: Insurer Summary of Call: Call to [facility] for Prior Authorization for Meloxicam.  Denied pt will need to try anothe Nsaid first. Pt has been in Indomethicin only per documentation. Attempts to call pt no answer. Sander Nephew RN  February 22, 2010 10:39 AM. Initial call taken by: Sander Nephew RN,  February 22, 2010 10:39 AM    New/Updated Medications: IBUPROFEN 400 MG TABS (IBUPROFEN) Take one tablet by mouth q6hrs as needed for pain

## 2010-03-01 NOTE — Medication Information (Signed)
Summary: rov/sl  Anticoagulant Therapy  Managed by: Freddrick March, RN, BSN Referring MD: Lovena Le MD, Carleene Overlie PCP: Dawna Part MD Supervising MD: Lia Foyer MD, Marcello Moores Indication 1: Atrial Fibrillation Lab Used: [facility] [place] Site: [place] INR POC 2.2 INR RANGE 2.0-3.0  Dietary changes: no    Health status changes: no    Bleeding/hemorrhagic complications: no    Recent/future hospitalizations: no    Any changes in medication regimen? no    Recent/future dental: no  Any missed doses?: no       Is patient compliant with meds? yes       Allergies: 1)  ! Penicillin 2)  ! * Oranges  Anticoagulation Management History:      The patient is taking warfarin and comes in today for a routine follow up visit.  Positive risk factors for bleeding include presence of serious comorbidities.  Negative risk factors for bleeding include an age less than 48 years old.  The bleeding index is 'intermediate risk'.  Positive CHADS2 values include History of CHF, History of HTN, and History of Diabetes.  Negative CHADS2 values include Age > 79 years old.  Anticoagulation responsible provider: Lia Foyer MD, Marcello Moores.  INR POC: 2.2.  Cuvette Lot#: [id number].  Exp: 10/2010.    Anticoagulation Management Assessment/Plan:      [name] current anticoagulation dose is Coumadin 10 mg tabs: as instructed.  The target INR is 2.0-3.0.  The next INR is due 09/22/2009.  Anticoagulation instructions were given to patient.  Results were reviewed/authorized by Freddrick March, RN, BSN.  He was notified by Freddrick March RN.         Prior Anticoagulation Instructions: INR 4.8  Do not take any Coumadin today, Friday, August 12th or tomorrow, Saturday, August 13th. Then, take Coumadin 1 tab (10 mg) Sun, Tues, Wed, Thur, Sat and Coumadin 0.5 tab (5 mg) on Mon and Fri. Return to clinic in 1 week.   Current Anticoagulation Instructions: INR 2.2  Continue on same dosage 1 tablet daily except  1/2 tablet on Mondays and Fridays.  Recheck in 1 week.

## 2010-03-01 NOTE — Medication Information (Signed)
Summary: rov/tm  Anticoagulant Therapy  Managed by: Gypsy Lore, PharmD Referring MD: Lovena Le MD, Carleene Overlie PCP: Dawna Part MD Supervising MD: Harrington Challenger MD, Nevin Bloodgood Indication 1: Atrial Fibrillation Lab Used: [facility] [place] Site: [place] INR POC 4.8 INR RANGE 2.0-3.0  Dietary changes: no    Health status changes: no    Bleeding/hemorrhagic complications: no    Recent/future hospitalizations: yes       Details: Went to the hospital this past week due to retaining fluid.   Any changes in medication regimen? no    Recent/future dental: no  Any missed doses?: no       Is patient compliant with meds? no     Details: See comments  Comments: Pt reports that he was told at the pharmacy and in the hospital that Coumadin tablets could not be cut in half. He also reports that some of his tablets were not scored. Due to this, pt has taken Coumadin 1 tab every day for the past week instead of 0.5 tab on two days as instructed. Instructed pt that Coumadin tablets could be split in half and should be scored. Pt expressed understanding.   Current Medications (verified): 1)  Digoxin 0.25 Mg Tabs (Digoxin) .... Take 1 Tablet By Mouth Once A Day 2)  Furosemide 10 Mg/ml Soln (Furosemide) .... Take 1 Tablet By Mouth Once A Day 3)  Prinivil 10 Mg Tabs (Lisinopril) .... Take 1 Tablet By Mouth Once A Day 4)  Colchicine 0.6 Mg Tabs (Colchicine) .... Take 1 Tablet By Mouth Two Times A Day As Needed. 5)  Pravachol 40 Mg Tabs (Pravastatin Sodium) .... Take 1 Tablet By Mouth Once A Day in The Evening 6)  Meloxicam 15 Mg Tabs (Meloxicam) .... Take 1 Tablet By Mouth Once A Day 7)  Lantus Solostar 100 Unit/ml Soln (Insulin Glargine) .... 20 Units Once A Day, Subcutaneous 8)  Bd Pen Needle Short U/f 31g X 8 Mm Misc (Insulin Pen Needle) .... Use To Inject Insulin 4  Times A Day. Diagnosis Code 250 9)  Onetouch Ultra Test  Strp (Glucose Blood) .... Use To Check Cbg Three Times A Day 10)   Prodigy Lancets 26g  Misc (Lancets) .... Use To Check Blood Sugar 5  Times A Day. 11)  Novolog Flexpen 100 Unit/ml Soln (Insulin Aspart) .... Inject 10 Units 15 Minutes Before Meals Three Times Daily 12)  Coumadin 10 Mg Tabs (Warfarin Sodium) .... As Instructed  Allergies (verified): 1)  ! Penicillin 2)  ! * Oranges  Anticoagulation Management History:      The patient is taking warfarin and comes in today for a routine follow up visit.  Positive risk factors for bleeding include presence of serious comorbidities.  Negative risk factors for bleeding include an age less than 68 years old.  The bleeding index is 'intermediate risk'.  Positive CHADS2 values include History of CHF, History of HTN, and History of Diabetes.  Negative CHADS2 values include Age > 73 years old.  Anticoagulation responsible provider: Harrington Challenger MD, Nevin Bloodgood.  INR POC: 4.8.  Cuvette Lot#: [id number].  Exp: 10/2010.    Anticoagulation Management Assessment/Plan:      [name] current anticoagulation dose is Coumadin 10 mg tabs: as instructed.  The next INR is due 09/15/2009.  Anticoagulation instructions were given to patient.  Results were reviewed/authorized by Gypsy Lore, PharmD.  He was notified by Gypsy Lore PharmD.         Prior Anticoagulation Instructions: INR 3.0 Change  dose to 1 tablet everyday except 1/2 tablet on Mondays and Fridays. Recheck in one week. Discontinue Lovenox.   Current Anticoagulation Instructions: INR 4.8  Do not take any Coumadin today, Friday, August 12th or tomorrow, Saturday, August 13th. Then, take Coumadin 1 tab (10 mg) Sun, Tues, Wed, Thur, Sat and Coumadin 0.5 tab (5 mg) on Mon and Fri. Return to clinic in 1 week.

## 2010-03-01 NOTE — Assessment & Plan Note (Signed)
Summary: gout flareup/pcp-yang/hla   Vital Signs:  Patient profile:   40 year old male Height:      75 inches (190.50 cm) Weight:      265.9 pounds (120.86 kg) BMI:     33.36 Temp:     97.6 degrees F (36.44 degrees C) oral Pulse rate:   75 / minute BP sitting:   133 / 91  (right arm)  Vitals Entered By: Hilda Blades Ditzler RN (October 12, 2009 8:44 AM) Is Patient Diabetic? Yes Did you bring your meter with you today? No Pain Assessment Patient in pain? yes     Location: both feet Intensity: 4 Type: aching Onset of pain  past 3-4 weeks Nutritional Status BMI of > 30 = obese Nutritional Status Detail appetite good CBG Result 456  Have you ever been in a relationship where you felt threatened, hurt or afraid?denies   Does patient need assistance? Functional Status Self care Ambulation Normal Comments Problems with gout past 3-4 weeks in feet. Pre-cet meds [facility].   Diabetic Foot Exam    10-g (5.07) Semmes-Weinstein Monofilament Test Performed by: Hilda Blades Ditzler RN          Right Foot          Left Foot Visual Inspection     normal         normal   Primary Care Provider:  Geanie Kenning MD   History of Present Illness: Patient is a 40 year old male with a past medical history of dilated nonischemic cardiomyopathy with an EF of 15%, diabetes mellitus, with an A1c of 10.3, hyperlipidemia, and gout presents to the outpatient for a gout flare and high sugars.   1. Gout: Was started on Colchicine  in August: 0.6 mg bid, never took Uloric since he was not able to refill it , Until one year ago he was on Allopurinol with max. dose and still flaires- it was  stopped because increased  ingestion. Now having  a gout flare since 3 weeks: knee, ankle, toes, hands.    2. Diabetes: currently on lantus 25 units at bedtime and  lantus 10 units in the morning with counting carbs: every 10 over  1unit . Yesterday a  total of 40 Units. He took Lantus 25 units at bed time , Sugars this morning  was 350, he took novolog 35 units and ate 2 bananas and now it is 456.   6 month ago  lantus 25 untis at bedtime and novolog 2-3 units a day just at lunch,weight lower 20 pounds, eating well, excercising every day. Since then no changes in diet or excercise but increased weight possible due to low testosteron.   3. [id number] on Pravastatin.    Patient is feeling well and denies CP, abdominal pain, nausea, vomiting, HA's, palpitations, blurred vision. fever, chills, diarrhea, constipation or SOB.   Depression History:      The patient denies a depressed mood most of the day and a diminished interest in his usual daily activities.         Preventive Screening-Counseling & Management  Alcohol-Tobacco     Alcohol drinks/day: 0     Smoking Status: never  Caffeine-Diet-Exercise     Does Patient Exercise: yes     Type of exercise: Gym  Current Medications (verified): 1)  Digoxin 0.25 Mg Tabs (Digoxin) .... Take 1 Tablet By Mouth Once A Day 2)  Furosemide 10 Mg/ml Soln (Furosemide) .... Take 1 Tablet By Mouth Once A Day 3)  Prinivil 10  Mg Tabs (Lisinopril) .... Take 1 Tablet By Mouth Once A Day 4)  Colchicine 0.6 Mg Tabs (Colchicine) .... Take 2 Tablets Per Mouth Now and Then Every 1 Hour One 1 Tablet Per Mouth Until Pain Resolves or Experiencing Gi Symptoms Including Diarrhea. 5)  Pravachol 40 Mg Tabs (Pravastatin Sodium) .... Take 1 Tablet By Mouth Once A Day in The Evening 6)  Meloxicam 15 Mg Tabs (Meloxicam) .... Take 1 Tablet By Mouth Once A Day 7)  Lantus Solostar 100 Unit/ml Soln (Insulin Glargine) .... 20 Units Once A Day, Subcutaneous 8)  Bd Pen Needle Short U/f 31g X 8 Mm Misc (Insulin Pen Needle) .... Use To Inject Insulin 4  Times A Day. Diagnosis Code 250 9)  Prodigy Lancets 26g  Misc (Lancets) .... Use To Check Blood Sugar 5  Times A Day. 10)  Novolog Flexpen 100 Unit/ml Soln (Insulin Aspart) .... Inject 10 Units 15 Minutes Before Meals Three Times Daily 11)  Coumadin 10  Mg Tabs (Warfarin Sodium) .... As Instructed 12)  Prodigy Preferred Monitor  Devi (Blood Glucose Monitoring Suppl) .... As Instructed 13)  Prodigy No Coding Blood Gluc  Strp (Glucose Blood) .... Check Cbg Three Times A Day 14)  Metoprolol Succinate 100 Mg Xr24h-Tab (Metoprolol Succinate)  Allergies: 1)  ! Penicillin 2)  ! * Oranges  Diabetes Management Exam:    Foot Exam (with socks and/or shoes not present):       Sensory-Monofilament:          Left foot: normal          Right foot: normal   Impression & Recommendations:  Problem # 1:  GOUT, ACUTE (ICD-274.01) The patient  presented with an acute flair. Was prescribed Colchicine 0.6 mg q 1 hours and was advised to continue until pain is relieved or is experiencing GI symptoms. Patient was advised to follow up in one week.   The following medications were removed from the medication list:    Uloric 40 Mg Tabs (Febuxostat) .Marland Kitchen... Take 40mg  daily for 2 weeks    Uloric 80 Mg Tabs (Febuxostat) .Marland Kitchen... Take one tablet daily His updated medication list for this problem includes:    Colchicine 0.6 Mg Tabs (Colchicine) .Marland Kitchen... Take 2 tablets per mouth now and then every 1 hour one 1 tablet per mouth until pain resolves or experiencing gi symptoms including diarrhea.  Problem # 2:  AODM (ICD-250.00) Since patient was in an acute gout flair, no changes were made in his medication. Consider to recheck on reevaluate at the next visit for possible changes in mangagement.  His updated medication list for this problem includes:    Prinivil 10 Mg Tabs (Lisinopril) .Marland Kitchen... Take 1 tablet by mouth once a day    Lantus Solostar 100 Unit/ml Soln (Insulin glargine) .Marland Kitchen... 20 units once a day, subcutaneous    Novolog Flexpen 100 Unit/ml Soln (Insulin aspart) ..... Inject 10 units 15 minutes before meals three times daily  Orders: T- Capillary Blood Glucose ([id number]) T-Hgb A1C (in-house) [id number])  Problem # 3:  DYSLIPIDEMIA (ICD-272.4) Consider to make any  changes after lipid panel.   His updated medication list for this problem includes:    Pravachol 40 Mg Tabs (Pravastatin sodium) .Marland Kitchen... Take 1 tablet by mouth once a day in the evening  Orders: T-Lipid Profile [id number]) T-Comprehensive Metabolic Panel ([id number])  Complete Medication List: 1)  Digoxin 0.25 Mg Tabs (Digoxin) .... Take 1 tablet by mouth once a day 2)  Furosemide 10 Mg/ml Soln (  Furosemide) .... Take 1 tablet by mouth once a day 3)  Prinivil 10 Mg Tabs (Lisinopril) .... Take 1 tablet by mouth once a day 4)  Colchicine 0.6 Mg Tabs (Colchicine) .... Take 2 tablets per mouth now and then every 1 hour one 1 tablet per mouth until pain resolves or experiencing gi symptoms including diarrhea. 5)  Pravachol 40 Mg Tabs (Pravastatin sodium) .... Take 1 tablet by mouth once a day in the evening 6)  Meloxicam 15 Mg Tabs (Meloxicam) .... Take 1 tablet by mouth once a day 7)  Lantus Solostar 100 Unit/ml Soln (Insulin glargine) .... 20 units once a day, subcutaneous 8)  Bd Pen Needle Short U/f 31g X 8 Mm Misc (Insulin pen needle) .... Use to inject insulin 4  times a day. diagnosis code 250 9)  Prodigy Lancets 26g Misc (Lancets) .... Use to check blood sugar 5  times a day. 10)  Novolog Flexpen 100 Unit/ml Soln (Insulin aspart) .... Inject 10 units 15 minutes before meals three times daily 11)  Coumadin 10 Mg Tabs (Warfarin sodium) .... As instructed 12)  Prodigy Preferred Monitor Devi (Blood glucose monitoring suppl) .... As instructed 13)  Prodigy No Coding Blood Gluc Strp (Glucose blood) .... Check cbg three times a day 14)  Metoprolol Succinate 100 Mg Xr24h-tab (Metoprolol succinate)  Other Orders: Influenza Vaccine NON MCR [id number])  Patient Instructions: 1)  Fullow up with Dr Newt Lukes next week. Take colchicine as directed. Check your sugars 3 times a day. Bring your meter and all your medication with you at the next visit. Come fasting to the visit for lab work ( lipid panel and  HgbA1c). Prescriptions: MELOXICAM 15 MG TABS (MELOXICAM) Take 1 tablet by mouth once a day  #30 x 3   Entered and Authorized by:   Rosalia Hammers MD   Signed by:   Rosalia Hammers MD on 10/12/2009   Method used:   Electronically to        [id number]* (retail)       [address]       [place], [place]  [address]       Ph: [phone] or [phone]       Fax: [phone]   RxID:   [id number]  Process Orders Check Orders Results:     [facility]: ABN not required for this insurance Tests Sent for requisitioning (October 19, 2009 8:28 AM):     10/12/2009: [facility] -- T-Lipid Profile [phone] (signed)     10/12/2009: [facility] -- T-Comprehensive Metabolic Panel [id number] (signed)     Prevention & Chronic Care Immunizations   Influenza vaccine: Fluvax Non-MCR  (10/12/2009)    Tetanus booster: Not documented    Pneumococcal vaccine: Not documented  Other Screening   Smoking status: never  (10/12/2009)  Diabetes Mellitus   HgbA1C: 10.5  (10/12/2009)   HgbA1C action/deferral: Ordered  (09/06/2009)    Eye exam: Not documented   Diabetic eye exam action/deferral: Ophthalmology referral  (11/10/2008)    Foot exam: yes  (10/12/2009)   Foot exam action/deferral: Do today   High risk foot: No  (03/18/2008)   Foot care education: Not documented   Foot exam due: 03/16/2009    Urine microalbumin/creatinine ratio: Not documented   Urine microalbumin action/deferral: Ordered  Lipids   Total Cholesterol: 181  (11/10/2008)   LDL: * mg/dL  (11/10/2008)   LDL Direct: Not documented  HDL: 26  (11/10/2008)   Triglycerides: 510  (11/10/2008)    SGOT (AST): 31  (11/10/2008)   SGPT (ALT): 62  (11/10/2008) CMP ordered    Alkaline phosphatase: 102  (11/10/2008)   Total bilirubin: 0.6  (11/10/2008)  Hypertension   Last Blood Pressure: 133 / 91  (10/12/2009)   Serum creatinine:  0.83  (09/13/2009)   Serum potassium 4.0  (09/13/2009) CMP ordered   Self-Management Support :   Personal Goals (by the next clinic visit) :     Personal A1C goal: 7  (09/06/2009)     Personal blood pressure goal: 130/80  (09/06/2009)     Personal LDL goal: 70  (09/06/2009)    Patient will work on the following items until the next clinic visit to reach self-care goals:     Medications and monitoring: take my medicines every day, check my blood sugar, bring all of my medications to every visit, examine my feet every day  (10/12/2009)     Eating: drink diet soda or water instead of juice or soda, eat more vegetables, use fresh or frozen vegetables, eat foods that are low in salt, eat baked foods instead of fried foods, eat fruit for snacks and desserts, limit or avoid alcohol  (10/12/2009)     Activity: take a 30 minute walk every day  (10/12/2009)    Diabetes self-management support: Written self-care plan, Education handout, Resources for patients handout  (10/12/2009)   Diabetes care plan printed   Diabetes education handout printed   Last diabetes self-management training by diabetes educator: 11/24/2008   Last medical nutrition therapy: 03/30/2008    Hypertension self-management support: Written self-care plan, Education handout, Resources for patients handout  (10/12/2009)   Hypertension self-care plan printed.   Hypertension education handout printed    Lipid self-management support: Written self-care plan, Education handout, Resources for patients handout  (10/12/2009)   Lipid self-care plan printed.   Lipid education handout printed      Resource handout printed.   Nursing Instructions: Diabetic foot exam today    Influenza Vaccine    Vaccine Type: Fluvax Non-MCR    Site: left deltoid    Mfr: GlaxoSmithKline    Dose: 0.5 ml    Route: IM    Given by: Hilda Blades Ditzler RN    Exp. Date: 07/28/2010    Lot #: [id number]    VIS given: 08/22/09 version given October 12, 2009.  Flu Vaccine Consent Questions    Do you have a history of severe allergic reactions to this vaccine? no    Any prior history of allergic reactions to egg and/or gelatin? no    Do you have a sensitivity to the preservative Thimersol? no    Do you have a past history of Guillan-Barre Syndrome? no    Do you currently have an acute febrile illness? no    Have you ever had a severe reaction to latex? no    Vaccine information given and explained to patient? yes    Last LDL:                                                 * mg/dL (11/10/2008 7:59:00 PM)          Diabetic Foot Exam Foot Inspection Is there a history of a foot ulcer?  No Is there a foot ulcer now?              No Can the patient see the bottom of their feet?          Yes Are the shoes appropriate in style and fit?          Yes Is there swelling or an abnormal foot shape?          No Are the toenails long?                No Are the toenails thick?                No Are the toenails ingrown?              No Is there heavy callous build-up?              No Is there a claw toe deformity?                          No Is there elevated skin temperature?            No Is there limited ankle dorsiflexion?            No Is there foot or ankle muscle weakness?            No Do you have pain in calf while walking?           No         10-g (5.07) Semmes-Weinstein Monofilament Test Performed by: Hilda Blades Ditzler RN          Right Foot          Left Foot Visual Inspection     normal         normal Test Control      normal         normal Site 1         normal         normal Site 2         normal         normal Site 3         normal         normal Site 4         normal         normal Site 5         normal         normal Site 6         normal         normal Site 7         normal         normal Site 8         normal         normal Site 9         normal         normal Site 10         normal          normal  Impression      normal         normal   Laboratory Results   Blood Tests   Date/Time Received: October 12, 2009 9:02 AM Date/Time Reported: Maryan Rued  October 12, 2009 9:02 AM   HGBA1C: 10.5%   (Normal Range: Non-Diabetic - 3-6%   Control Diabetic - 6-8%) CBG Random:: 456mg /dL

## 2010-03-01 NOTE — Medication Information (Signed)
Summary: rov/tm  Anticoagulant Therapy  Managed by: Porfirio Oar, PharmD Referring MD: Lovena Le MD, Carleene Overlie PCP: Geanie Kenning MD Supervising MD: Lovena Le MD, Carleene Overlie Indication 1: Atrial Fibrillation Lab Used: [facility] [place] Site: [place] INR POC 2.8 INR RANGE 2.0-3.0           Allergies: 1)  ! Penicillin 2)  ! * Oranges  Anticoagulation Management History:      The patient is taking warfarin and comes in today for a routine follow up visit.  Positive risk factors for bleeding include presence of serious comorbidities.  Negative risk factors for bleeding include an age less than 40 years old.  The bleeding index is 'intermediate risk'.  Positive CHADS2 values include History of CHF, History of HTN, and History of Diabetes.  Negative CHADS2 values include Age > 34 years old.  Anticoagulation responsible provider: Lovena Le MD, Carleene Overlie.  INR POC: 2.8.  Cuvette Lot#: [id number].  Exp: 11/2010.    Anticoagulation Management Assessment/Plan:      [name] current anticoagulation dose is Coumadin 10 mg tabs: as instructed.  The target INR is 2.0-3.0.  The next INR is due 11/15/2009.  Anticoagulation instructions were given to patient.  Results were reviewed/authorized by Porfirio Oar, PharmD.  He was notified by Sanda Linger.         Prior Anticoagulation Instructions: INR 3.2 Today only take 5mg s then resume 10mg s everyday except 5mg s on Mondays and Fridays. Recheck in 2 weeks.   Current Anticoagulation Instructions: INR 2.8  Continue taking one tablet every day except for one-half tablet on Monday and Friday.  Recheck in three weeks.

## 2010-03-01 NOTE — Medication Information (Signed)
Summary: ccn/ok per sally  Anticoagulant Therapy  Managed by: Tula Nakayama, RN, BSN Referring MD: Lovena Le MD, Carleene Overlie PCP: Dawna Part MD Supervising MD: Burt Knack MD, Legrand Como Indication 1: Atrial Fibrillation Lab Used: [facility] [place] Site: [place] INR POC 3.0 INR RANGE 2.0-3.0  Dietary changes: no    Health status changes: yes       Details: Having gout attack on Rt foot great toe  Bleeding/hemorrhagic complications: no    Recent/future hospitalizations: yes       Details: Discharged from [facility] on 08/28/09 Admitted on 7/28/11from [facility] discharaging.   Any changes in medication regimen? yes       Details: Colchicine daily starting today  Recent/future dental: no  Any missed doses?: no       Is patient compliant with meds? yes      Comments: New pt seen today in clinic for Dx of Afib. New pt education completed on diet, meds (OTC's) bleeding risk, protocol for pending procedures and dosing instructions.  Pt started on 08/25/09 with 10mg s daily.  1.53 was INR on 08/28/09. Discharged home on Lovenox two times a day.    Allergies (verified): 1)  ! Penicillin 2)  ! * Oranges  Anticoagulation Management History:      The patient comes in today for his initial visit for anticoagulation therapy.  Positive risk factors for bleeding include presence of serious comorbidities.  Negative risk factors for bleeding include an age less than 55 years old.  The bleeding index is 'intermediate risk'.  Positive CHADS2 values include History of CHF, History of HTN, and History of Diabetes.  Negative CHADS2 values include Age > 69 years old.  Anticoagulation responsible provider: Burt Knack MD, Legrand Como.  INR POC: 3.0.  Cuvette Lot#: [id number].  Exp: 10/2010.    Anticoagulation Management Assessment/Plan:      The next INR is due 09/08/2009.  Anticoagulation instructions were given to patient.  Results were reviewed/authorized by Tula Nakayama, RN, BSN.  He was notified by Tula Nakayama, RN,  BSN.         Current Anticoagulation Instructions: INR 3.0 Change dose to 1 tablet everyday except 1/2 tablet on Mondays and Fridays. Recheck in one week. Discontinue Lovenox.

## 2010-03-01 NOTE — Assessment & Plan Note (Signed)
Summary: eph/jml   Primary Provider:  Dawna Part MD   History of Present Illness: Mr. Doctor returns today for followup.  He is a pleasant 40 yo man with a h/o DCM, CHF, PAF, and gout.  He was hospitalized several months ago with an ICD shock secondary to atrial fib.  He was started on coumadin and continued on beta blockers and digoxin.  He denies non-compliance.  His main complaint has been related to his gout.  He denies c/p, sob or peripheral edema.  He has palpitations and joint pain in his great toe, wrist and ankle.  He denies dietary non-compliance.  Current Medications (verified): 1)  Digoxin 0.25 Mg Tabs (Digoxin) .... Take 1 Tablet By Mouth Once A Day 2)  Furosemide 10 Mg/ml Soln (Furosemide) .... Take 1 Tablet By Mouth Once A Day 3)  Prinivil 10 Mg Tabs (Lisinopril) .... Take 1 Tablet By Mouth Once A Day 4)  Colchicine 0.6 Mg Tabs (Colchicine) .... Take 1 Tablet By Mouth Two Times A Day As Needed. 5)  Pravachol 40 Mg Tabs (Pravastatin Sodium) .... Take 1 Tablet By Mouth Once A Day in The Evening 6)  Meloxicam 15 Mg Tabs (Meloxicam) .... Take 1 Tablet By Mouth Once A Day 7)  Lantus Solostar 100 Unit/ml Soln (Insulin Glargine) .... 20 Units Once A Day, Subcutaneous 8)  Bd Pen Needle Short U/f 31g X 8 Mm Misc (Insulin Pen Needle) .... Use To Inject Insulin 4  Times A Day. Diagnosis Code 250 9)  Prodigy Lancets 26g  Misc (Lancets) .... Use To Check Blood Sugar 5  Times A Day. 10)  Novolog Flexpen 100 Unit/ml Soln (Insulin Aspart) .... Inject 10 Units 15 Minutes Before Meals Three Times Daily 11)  Coumadin 10 Mg Tabs (Warfarin Sodium) .... As Instructed 12)  Prodigy Preferred Monitor  Devi (Blood Glucose Monitoring Suppl) .... As Instructed 13)  Prodigy No Coding Blood Gluc  Strp (Glucose Blood) .... Check Cbg Three Times A Day  Allergies (verified): 1)  ! Penicillin 2)  ! * Oranges  Past History:  Past Medical History: Last updated: 11/16/2008 Dilated cardiomyopathy s/p ICD  2008 GOUT, UNSPECIFIED (ICD-274.9) DYSLIPIDEMIA (ICD-272.4) ESSENTIAL HYPERTENSION, BENIGN (ICD-401.1) AODM (ICD-250.00) CHF (ICD-428.0) CAD  Review of Systems  The patient denies chest pain, syncope, dyspnea on exertion, and peripheral edema.    Vital Signs:  Patient profile:   40 year old male Weight:      266 pounds Pulse rate:   103 / minute Pulse rhythm:   regular BP sitting:   152 / 80  (left arm) Cuff size:   large  Vitals Entered By: Janan Halter, RN, BSN (October 05, 2009 12:32 PM)  Physical Exam  General:  alert and well-developed.   Head:  normocephalic and atraumatic.   Eyes:  vision grossly intact.   Mouth:  pharynx pink and moist.   Neck:  supple.   Lungs:  normal respiratory effort and normal breath sounds.   Heart:  normal rate and regular rhythm.   Abdomen:  soft and non-tender.   Msk:  right great toe is swollen.  A gouty tophi is on the right hand. Pulses:  normal peripheral pulses  Extremities:  trace edema.  Neurologic:  non focal.    EKG  Procedure date:  10/05/2009  Findings:      Sinus tachycardia with rate of:  103.   ICD Specifications ICD Vendor:  [employer]     ICD Model Number:  [id number]  ICD Serial Number:  [id number] ICD DOI:  09/14/2007     ICD Implanting MD:  Alycia Rossetti  Lead 1:    Location: RV     DOI: 09/14/2007     Model #: [id number]     Serial #: [id number]     Status: active  Indications::  NICM   ICD Follow Up Battery Voltage:  3.16 V     Charge Time:  8.3 seconds     Underlying rhythm:  SINUS TACH ICD Dependent:  No       ICD Device Measurements Right Ventricle:  Amplitude: 20.0 mV, Impedance: 589 ohms, Threshold: 0.75 V at 0.40 msec Shock Impedance: 74 ohms   Episodes MS Episodes:  0     Shock:  0     ATP:  0     Nonsustained:  0     Atrial Therapies:  0 Ventricular Pacing:  <0.1%  Brady Parameters Mode VVI     Lower Rate Limit:  40      Tachy Zones VF:  200     Next Cardiology Appt Due:  12/28/2009 Tech  Comments:  NORMAL DEVICE FUNCTION.  NO EPISODES SINCE LAST CHECK.  SINUS TACH ON CHECK.  CHANGED RV OUTPUT FROM 2.0 TO 2.5 V.  CHANGED MAX LEAD IMPEDANCE FOR LIA.  PT PREFERS OV RATHER THAN CARELINK. Shelly Bombard  October 05, 2009 12:53 PM MD Comments:  Agree with above.  Impression & Recommendations:  Problem # 1:  AUTOMATIC IMPLANTABLE CARDIAC DEFIBRILLATOR SITU (ICD-V45.02) His device is working normally.  Will recheck in several months.  Problem # 2:  CHF (ICD-428.0) His symptoms remain class 2.  He will continue his meds as below and maintain a low sodium diet. His updated medication list for this problem includes:    Digoxin 0.25 Mg Tabs (Digoxin) .Marland Kitchen... Take 1 tablet by mouth once a day    Furosemide 10 Mg/ml Soln (Furosemide) .Marland Kitchen... Take 1 tablet by mouth once a day    Prinivil 10 Mg Tabs (Lisinopril) .Marland Kitchen... Take 1 tablet by mouth once a day    Coumadin 10 Mg Tabs (Warfarin sodium) .Marland Kitchen... As instructed    Metoprolol Succinate 100 Mg Xr24h-tab (Metoprolol succinate)  Problem # 3:  GOUT, UNSPECIFIED (ICD-274.9) Today I have asked him to start Uloric in conjunction with colchicine. His updated medication list for this problem includes:    Colchicine 0.6 Mg Tabs (Colchicine) .Marland Kitchen... Take 1 tablet by mouth two times a day as needed.    Uloric 40 Mg Tabs (Febuxostat) .Marland Kitchen... Take 40mg  daily for 2 weeks    Uloric 80 Mg Tabs (Febuxostat) .Marland Kitchen... Take one tablet daily  Patient Instructions: 1)  Your physician has recommended you make the following change in your medication: Adding Uloric 40mg  daily for 2 weeks, then 80mg  daily. 2)  Your physician wants you to follow-up in:   4 months. You will receive a reminder letter in the mail two months in advance. If you don't receive a letter, please call our office to schedule the follow-up appointment. Prescriptions: ULORIC 80 MG TABS (FEBUXOSTAT) take one tablet daily  #30 x 3   Entered by:   Joan Flores RN   Authorized by:   Sophronia Simas, MD, [facility]   Signed by:   Joan Flores RN on 10/05/2009   Method used:   Electronically to        [id number]* (retail)       [address]  [place], [place]  [address]       Ph: [phone] or [phone]       Fax: [phone]   RxID:   [id number] ULORIC 40 MG TABS (FEBUXOSTAT) take 40mg  daily for 2 weeks  #14 x 0   Entered by:   Joan Flores RN   Authorized by:   Sophronia Simas, MD, [facility]   Signed by:   Joan Flores RN on 10/05/2009   Method used:   Electronically to        [facility]. [id number]* (retail)       [address]       [place], [place]  [address]       Ph: [phone] or [phone]       Fax: [phone]   RxID:   [id number]

## 2010-03-01 NOTE — Assessment & Plan Note (Signed)
Summary: ACUTE-DIABETES AND LOW TESTOSTERONE/(YANG)/CFB   Vital Signs:  Patient profile:   40 year old male Height:      75 inches Weight:      268.1 pounds BMI:     33.63 Temp:     97.4 degrees F oral Pulse rate:   89 / minute BP sitting:   145 / 99  (right arm)  Vitals Entered By: Silverio Decamp NT II (September 06, 2009 9:33 AM) CC: follow-up visit for gout Is Patient Diabetic? Yes Did you bring your meter with you today? No Pain Assessment Patient in pain? yes     Location: right foot Intensity: 8 Type: aching Onset of pain  Intermittent Nutritional Status BMI of > 30 = obese  Have you ever been in a relationship where you felt threatened, hurt or afraid?No   Does patient need assistance? Functional Status Self care Ambulation Normal   Primary Care Riku Buttery:  Dawna Part MD  CC:  follow-up visit for gout.  History of Present Illness: patient is a 40 year old male with a past medical history of dilated nonischemic cardiomyopathy with an EF of 15%, diabetes mellitus, with an A1c of 10.3, hyperlipidemia, and gout presents to the outpatient clinic for hospital follow-up, after he was admitted for pacemaker misfiring. Also to note that the patient yesterday had some mild shortness of breath.  He saw his cardiologist, and they reinitiated his Lasix today.  He feels better and reports improved lower extremity edema.  Patient would also like to get a refill for his acute gout medication, the patient was diagnosed with gout.  Several years ago and is only treated with colchicine as he has very infrequent attacks.  His last gout attack was one month ago.  Patient reports that during his last hospitalization.  His insulin dose was reduced.  He has a new meter, however, he did not bring in today for the patient to keep checking his sugars have come back for follow-up to assess whether further adjustments are needed.  Patient is feeling well and denies CP, abdominal pain, nausea,  vomiting, HA's, palpitations, blurred vision. fever, chills, diarrhea, constipation or SOB.    Preventive Screening-Counseling & Management  Alcohol-Tobacco     Alcohol drinks/day: 0     Smoking Status: never  Caffeine-Diet-Exercise     Does Patient Exercise: yes     Type of exercise: Gym  Current Medications (verified): 1)  Digoxin 0.25 Mg Tabs (Digoxin) .... Take 1 Tablet By Mouth Once A Day 2)  Furosemide 10 Mg/ml Soln (Furosemide) .... Take 1 Tablet By Mouth Once A Day 3)  Prinivil 10 Mg Tabs (Lisinopril) .... Take 1 Tablet By Mouth Once A Day 4)  Colchicine 0.6 Mg Tabs (Colchicine) .... Take 1 Tablet By Mouth Two Times A Day As Needed. 5)  Pravachol 40 Mg Tabs (Pravastatin Sodium) .... Take 1 Tablet By Mouth Once A Day in The Evening 6)  Meloxicam 15 Mg Tabs (Meloxicam) .... Take 1 Tablet By Mouth Once A Day 7)  Lantus Solostar 100 Unit/ml Soln (Insulin Glargine) .... 20 Units Once A Day, Subcutaneous 8)  Bd Pen Needle Short U/f 31g X 8 Mm Misc (Insulin Pen Needle) .... Use To Inject Insulin 4  Times A Day. Diagnosis Code 250 9)  Onetouch Ultra Test  Strp (Glucose Blood) .... Use To Check Cbg Three Times A Day 10)  Prodigy Lancets 26g  Misc (Lancets) .... Use To Check Blood Sugar 5  Times A Day. 11)  Novolog  Flexpen 100 Unit/ml Soln (Insulin Aspart) .... Inject 10 Units 15 Minutes Before Meals Three Times Daily 12)  Coumadin 10 Mg Tabs (Warfarin Sodium) .... As Instructed  Allergies (verified): 1)  ! Penicillin 2)  ! * Oranges  Review of Systems       As per HPI  Physical Exam  General:  alert and well-developed.   Mouth:  pharynx pink and moist.   Neck:  supple.   Lungs:  normal respiratory effort and normal breath sounds.   Heart:  normal rate and regular rhythm.   Abdomen:  soft and non-tender.   Pulses:  normal peripheral pulses  Extremities:  trace edema.  Neurologic:  non focal.  Psych:  normally interactive.     Impression & Recommendations:  Problem #  1:  GOUT, UNSPECIFIED (ICD-274.9) will give him script for cholchicine incase he has another attack, if he has an attack more often, will consider longterm rx.   His updated medication list for this problem includes:    Colchicine 0.6 Mg Tabs (Colchicine) .Marland Kitchen... Take 1 tablet by mouth two times a day as needed.  Problem # 2:  ESSENTIAL HYPERTENSION, BENIGN (ICD-401.1) being managed by cardiology  The following medications were removed from the medication list:    Norvasc 5 Mg Tabs (Amlodipine besylate) .Marland Kitchen... Take 1 tablet by mouth once a day    Coreg 12.5 Mg Tabs (Carvedilol) .Marland Kitchen... Take 1 tablet by mouth two times a day    Lisinopril 10 Mg Tabs (Lisinopril) .Marland Kitchen... Take 1 tablet by mouth once a day His updated medication list for this problem includes:    Furosemide 10 Mg/ml Soln (Furosemide) .Marland Kitchen... Take 1 tablet by mouth once a day    Prinivil 10 Mg Tabs (Lisinopril) .Marland Kitchen... Take 1 tablet by mouth once a day  Orders: T-Basic Metabolic Panel (99991111)  Problem # 3:  DYSLIPIDEMIA (ICD-272.4) check FLP on next visit, if high, change/increase statins.   His updated medication list for this problem includes:    Pravachol 40 Mg Tabs (Pravastatin sodium) .Marland Kitchen... Take 1 tablet by mouth once a day in the evening  Labs Reviewed: SGOT: 31 (11/10/2008)   SGPT: 62 (11/10/2008)   HDL:26 (11/10/2008), 8 (03/30/2008)  LDL:* mg/dL (11/10/2008), 139 (03/30/2008)  Chol:181 (11/10/2008), 198 (03/30/2008)  Trig:510 (11/10/2008), 257 (03/30/2008)  Problem # 4:  AODM (ICD-250.00) on lantus 20, will have pt checking cbg, and bring back in one month for lantus adjustment.  The following medications were removed from the medication list:    Lisinopril 10 Mg Tabs (Lisinopril) .Marland Kitchen... Take 1 tablet by mouth once a day His updated medication list for this problem includes:    Prinivil 10 Mg Tabs (Lisinopril) .Marland Kitchen... Take 1 tablet by mouth once a day    Lantus Solostar 100 Unit/ml Soln (Insulin glargine) .Marland Kitchen... 20  units once a day, subcutaneous    Novolog Flexpen 100 Unit/ml Soln (Insulin aspart) ..... Inject 10 units 15 minutes before meals three times daily  Labs Reviewed: Creat: 0.93 (11/10/2008)    Reviewed HgBA1c results: 10.7 (12/05/2008)  11.7 (09/13/2008)  Complete Medication List: 1)  Digoxin 0.25 Mg Tabs (Digoxin) .... Take 1 tablet by mouth once a day 2)  Furosemide 10 Mg/ml Soln (Furosemide) .... Take 1 tablet by mouth once a day 3)  Prinivil 10 Mg Tabs (Lisinopril) .... Take 1 tablet by mouth once a day 4)  Colchicine 0.6 Mg Tabs (Colchicine) .... Take 1 tablet by mouth two times a day as needed. 5)  Pravachol 40 Mg Tabs (Pravastatin sodium) .... Take 1 tablet by mouth once a day in the evening 6)  Meloxicam 15 Mg Tabs (Meloxicam) .... Take 1 tablet by mouth once a day 7)  Lantus Solostar 100 Unit/ml Soln (Insulin glargine) .... 20 units once a day, subcutaneous 8)  Bd Pen Needle Short U/f 31g X 8 Mm Misc (Insulin pen needle) .... Use to inject insulin 4  times a day. diagnosis code 250 9)  Onetouch Ultra Test Strp (Glucose blood) .... Use to check cbg three times a day 10)  Prodigy Lancets 26g Misc (Lancets) .... Use to check blood sugar 5  times a day. 11)  Novolog Flexpen 100 Unit/ml Soln (Insulin aspart) .... Inject 10 units 15 minutes before meals three times daily 12)  Coumadin 10 Mg Tabs (Warfarin sodium) .... As instructed  Patient Instructions: 1)  Please schedule a follow-up appointment in 1 month. Prescriptions: ONETOUCH ULTRA TEST  STRP (GLUCOSE BLOOD) use to check CBG three times a day  #90 x 6   Entered and Authorized by:   Vertell Limber MD   Signed by:   Vertell Limber MD on 09/06/2009   Method used:   Electronically to        [facility] (retail)       [address]       [place], [place]  [address]       Ph: [phone]       Fax: [phone]   RxID:   [id number] PRODIGY LANCETS 26G  MISC (LANCETS) Use to check blood sugar 5   times a day.  #150 x 11   Entered and Authorized by:   Vertell Limber MD   Signed by:   Vertell Limber MD on 09/06/2009   Method used:   Electronically to        [facility] (retail)       [address]       [place], [place]  [address]       Ph: [phone]       Fax: [phone]   RxID:   [id number] COLCHICINE 0.6 MG TABS (COLCHICINE) Take 1 tablet by mouth two times a day as needed.  #30 x 1   Entered and Authorized by:   Vertell Limber MD   Signed by:   Vertell Limber MD on 09/06/2009   Method used:   Electronically to        [facility] (retail)       [address]       [place],   [address]       Ph: [phone]       Fax: [phone]   RxID:   [id number]   Prevention & Chronic Care Immunizations   Influenza vaccine: Fluvax 3+  (11/24/2008)    Tetanus booster: Not documented    Pneumococcal vaccine: Not documented  Other Screening   Smoking status: never  (09/06/2009)  Diabetes Mellitus   HgbA1C: 10.7  (12/05/2008)   HgbA1C action/deferral: Ordered  (09/06/2009)    Eye exam: Not documented   Diabetic eye exam action/deferral: Ophthalmology referral  (11/10/2008)    Foot exam: yes  (03/18/2008)   High risk foot: No  (03/18/2008)   Foot care education: Not documented   Foot exam due: 03/16/2009    Urine microalbumin/creatinine  ratio: Not documented   Urine microalbumin action/deferral: Ordered    Diabetes flowsheet reviewed?: Yes   Progress toward A1C goal: Unchanged  Lipids   Total Cholesterol: 181  (11/10/2008)   LDL: * mg/dL  (11/10/2008)   LDL Direct: Not documented   HDL: 26  (11/10/2008)   Triglycerides: 510  (11/10/2008)    SGOT (AST): 31  (11/10/2008)   SGPT (ALT): 62  (11/10/2008)   Alkaline phosphatase: 102  (11/10/2008)   Total bilirubin: 0.6  (11/10/2008)    Lipid flowsheet reviewed?: Yes   Progress toward LDL goal: Unchanged  Hypertension   Last Blood  Pressure: 145 / 99  (09/06/2009)   Serum creatinine: 0.93  (11/10/2008)   Serum potassium 4.5  (11/10/2008)    Hypertension flowsheet reviewed?: Yes   Progress toward BP goal: Deteriorated  Self-Management Support :   Personal Goals (by the next clinic visit) :     Personal A1C goal: 7  (09/06/2009)     Personal blood pressure goal: 130/80  (09/06/2009)     Personal LDL goal: 70  (09/06/2009)    Patient will work on the following items until the next clinic visit to reach self-care goals:     Medications and monitoring: take my medicines every day, check my blood sugar  (09/06/2009)     Eating: drink diet soda or water instead of juice or soda, use fresh or frozen vegetables, eat foods that are low in salt, eat baked foods instead of fried foods, eat fruit for snacks and desserts  (09/06/2009)     Activity: take a 30 minute walk every day  (09/06/2009)    Diabetes self-management support: Education handout, Written self-care plan  (09/06/2009)   Diabetes care plan printed   Diabetes education handout printed   Last diabetes self-management training by diabetes educator: 11/24/2008   Last medical nutrition therapy: 03/30/2008    Hypertension self-management support: Education handout, Written self-care plan  (09/06/2009)   Hypertension self-care plan printed.   Hypertension education handout printed    Lipid self-management support: Education handout, Written self-care plan  (09/06/2009)   Lipid self-care plan printed.   Lipid education handout printed   Nursing Instructions: HgbA1C today (see order)    Process Orders Check Orders Results:     [facility]: [id number] not required for this insurance Tests Sent for requisitioning (September 06, 2009 12:00 PM):     09/06/2009: [facility] -- T-Basic Metabolic Panel [id number] (signed)

## 2010-03-01 NOTE — Miscellaneous (Signed)
Summary: Hosp. Admission: N/V & RUQ Pain  INTERNAL MEDICINE TEACHING SERVICE ADMISSION HISTORY AND PHYSICAL  Attending: Dr. Desiree Hane 1st Contact: Dr. Amalia Hailey  [phone] 2nd Contact: Dr. Dyann Kief [phone]  After 5 pm on weekdays, on weekends and holidays: 1st Contact: [phone] 2nd Contact: [phone]  PCP: Dr. Caryn Section  CC: Nausea/Vomiting/Diarrhea & RUQ pain  HPI: Patient is a 40 year old male with a PMH of dilated cardiomyopathy s/p ICD 2008, CHF, CAD, HTN and DM who presents with a chief complaint of nausea, vomiting, diarrhea and RUQ pain that began at around 3 am this morning. He has vomited several (aprox/ 15 times), patient also complaints of subjective fever and a significant family hx of gallbladder disease. Patient denies CP, SOB, hematemesis and hematochezia.  Allergies: ! PENICILLIN  Past Medical History: Dilated cardiomyopathy s/p ICD 2008 GOUT, UNSPECIFIED (ICD-274.9) DYSLIPIDEMIA (ICD-272.4) ESSENTIAL HYPERTENSION, BENIGN (ICD-401.1) AODM (ICD-250.00) CHF (ICD-428.0) CAD  Current Meds:  DIGOXIN 0.25 MG TABS (DIGOXIN) Take 1 tablet by mouth once a day LASIX 40 MG TABS (FUROSEMIDE) Take 1 tablet by mouth once a day COLCHICINE 0.6 MG TABS (COLCHICINE) Take 1 tablet by mouth two times a day as needed. NORVASC 5 MG TABS (AMLODIPINE BESYLATE) Take 1 tablet by mouth once a day COREG 12.5 MG TABS (CARVEDILOL) Take 1 tablet by mouth two times a day PROMETHAZINE HCL 25 MG TABS (PROMETHAZINE HCL) One tab as required for nausea, up to four times a day. PRAVACHOL 40 MG TABS (PRAVASTATIN SODIUM) Take 1 tablet by mouth once a day in the evening MELOXICAM 15 MG TABS (MELOXICAM) Take 1 tablet by mouth once a day LISINOPRIL 10 MG TABS (LISINOPRIL) Take 1 tablet by mouth once a day LANTUS SOLOSTAR 100 UNIT/ML SOLN (INSULIN GLARGINE) 45 units once a day, subcutaneous BD PEN NEEDLE SHORT U/F 31G X 8 MM MISC (INSULIN PEN NEEDLE) Use to inject insulin 4  times a day. Diagnosis code  250 PRODIGY BLOOD GLUCOSE TEST  STRP (GLUCOSE BLOOD) Use to check blood sugar 5 times a day PRODIGY LANCETS 26G  MISC (LANCETS) Use to check blood sugar 5  times a day. NOVOLOG FLEXPEN 100 UNIT/ML SOLN (INSULIN ASPART) inject 10 units 15 minutes before meals three times daily FISH OIL 1000 MG CAPS (OMEGA-3 FATTY ACIDS) Take 1 capsule by mouth three times a day   Social History: Married, lives in [place] Use - No.  Alcohol Use - No. Drug Use - No.  FAMILY HISTORY Coronary Artery Disease  Diabetes  ROS: Negative, except as outlined on HPI.  VITALS: 13:31 T: 98.6 P: 120 BP: 142/86 R: 18 O2SAT: 96 ON: RA 17:11 HR: 113 BP 139/72 RR: 18 O2SAT: 96 ON: RA PHYSICAL EXAM: General:  mild diaphoresis, alert, well-developed, and cooperative to examination.   Head:  normocephalic and atraumatic.   Eyes:  vision grossly intact, pupils equal, pupils round, pupils reactive to light, no injection and anicteric.  Mouth:  mucous membranes dry, no overt erythema or exudates.  Neck:  supple, full ROM.   Lungs:  normal respiratory effort, no accessory muscle use, normal breath sounds, no crackles, and no wheezes. Heart:  tachycardic, regular rhythm, no murmur, no gallop, and no rub.   Abdomen:  soft, non-tender, normal bowel sounds, no distention, no guarding, no rebound tenderness. The exam was performed s/p morphine.    Pulses:  2+ DP and radial pulses bilaterally Extremities:  No cyanosis, clubbing, edema. Neurologic:  alert & oriented X3, cranial nerves II-XII intact, strength normal in  all extremities, sensation intact to light touch.   Skin:  no rashes.   Psych:  Oriented X3, memory intact for recent and remote, normally interactive, good eye contact.  LABS: Abdominal U/S  1.  Several small gallstones.  No sonographic findings of acute cholecystitis or biliary dilatation. 2.  Diffuse hepatic steatosis.   Color, Urine                             YELLOW            YELLOW   Appearance                               CLEAR             CLEAR  Specific Gravity                         1.042      h      1.005-1.030  pH                                       5.5               5.0-8.0  Urine Glucose                            >1000      a      NEG              mg/dL  Bilirubin                                NEGATIVE          NEG  Ketones                                  15         a      NEG              mg/dL  Blood                                    TRACE      a      NEG  Protein                                  >300       a      NEG              mg/dL  Urobilinogen                             0.2               0.0-1.0          mg/dL  Nitrite  NEGATIVE          NEG  Leukocytes                               NEGATIVE          NEG  WBC / HPF                                0-2               <3               WBC/hpf  RBC / HPF                                0-2               <3               RBC/hpf   Sodium (NA)                              136               135-145          mEq/L  Potassium (K)                            4.0               3.5-5.1          mEq/L  Chloride                                 103               96-112           mEq/L  CO2                                      18         l      19-32            mEq/L  Glucose                                  344        h      70-99            mg/dL  BUN                                      17                6-23             mg/dL  Creatinine                               0.88  0.4-1.5          mg/dL  GFR, Est Non African American            >60               >60              mL/min  GFR, Est African American                >60               >60              mL/min    Oversized comment, see footnote  1  Bilirubin, Total                         1.1               0.3-1.2          mg/dL  Alkaline Phosphatase                     97                39-117           U/L  SGOT  (AST)                               31                0-37             U/L  SGPT (ALT)                               50                0-53             U/L  Total  Protein                           7.8               6.0-8.3          g/dL  Albumin-Blood                            3.9               3.5-5.2          g/dL  Calcium                                  9.1               8.4-10.5         mg/dL   WBC                                      14.5       h      4.0-10.5         K/uL  RBC  6.33       h      4.22-5.81        MIL/uL  Hemoglobin (HGB)                         18.5       h      13.0-17.0        g/dL  Hematocrit (HCT)                         53.5       h      39.0-52.0        %  MCV                                      84.4              78.0-100.0       fL  MCHC                                     34.7              30.0-36.0        g/dL  RDW                                      13.1              11.5-15.5        %  Platelet Count (PLT)                     214               150-400          K/uL  Neutrophils, %                           87         h      43-77            %  Lymphocytes, %                           6          l      12-46            %  Monocytes, %                             6                 3-12             %  Eosinophils, %                           1                 0-5              %  Basophils, %  0                 0-1              %  Neutrophils, Absolute                    12.6       h      1.7-7.7          K/uL  Lymphocytes, Absolute                    0.8               0.7-4.0          K/uL  Monocytes, Absolute                      0.8               0.1-1.0          K/uL  Eosinophils, Absolute                    0.2               0.0-0.7          K/uL  Basophils, Absolute                      0.0               0.0-0.1          K/uL  ASSESSMENT AND PLAN: 1. N/V/D & RUQ Pain: Patient seen and  evaluated by surgery and with cholelithiasis on RUQ U/S thought to have acute cholecystitis and will need surgery. Other potential diagnoses may be pancreatitis, biliary colic, or ascending cholangitis. Will check lipase and amylase. Will consult cardiology re: turning off AICD prior to surgery and cardiac clearance. Pain and nausea controlled with morphine and zofran. Also rehydrating patient with NS @ 125 cc/hr given marked dehydration per labs. Cipro and flagyl to cover for infection. Will rule out ACS as well given cardiac history, nausea, vomiting and pain - although CP was not specifically mentioned apart from pain with active vomiting. 2. Tachycardia: Likely related to acute illness. Will cycle cardiac enzymes and check EKG given cardiac history and monitor patient on telemetry. 3. DM: Elevated CBG in ED in 300 range. Will provide 35 units lantus. Will use SSI and start patient on clear liquids. NPO after midnight. 4. HTN:  Will continue current regimen. Bp stable. 5. CHF:  Stable, recently seen by cardiology. Continue current regimen. Will ask Dr. Lovena Le to follow him regarding pacemaker as this will need to be turned off prior to surgery. 6. Gout: Stable. Holding colchicine. No complaint of any joint pain. 7. VTE PROPH: Will order SCDs.

## 2010-03-01 NOTE — Miscellaneous (Signed)
Summary: Orders Update  Clinical Lists Changes  Orders: Added new Test order of TLB-BMP (Basic Metabolic Panel-BMET) (80048-METABOL) - Signed 

## 2010-03-01 NOTE — Progress Notes (Signed)
  Phone Note Refill Request Message from:  Fax from Pharmacy on September 11, 2009 5:36 PM  Refills Requested: Medication #1:  PRODIGY LANCETS 26G  MISC Use to check blood sugar 5  times a day.   Dosage confirmed as above?Dosage Confirmed  Follow-up for Phone Call        Already refilled. Follow-up by: Geanie Kenning MD,  September 12, 2009 9:18 PM

## 2010-03-01 NOTE — Medication Information (Signed)
Summary: rov/ewj  Anticoagulant Therapy  Managed by: Tula Nakayama, RN, BSN Referring MD: Lovena Le MD, Carleene Overlie PCP: Dawna Part MD Supervising MD: Aundra Dubin MD, [place] Indication 1: Atrial Fibrillation Lab Used: [facility] [place] Site: [place] INR POC 3.2 INR RANGE 2.0-3.0  Dietary changes: no    Health status changes: no    Bleeding/hemorrhagic complications: no    Recent/future hospitalizations: no    Any changes in medication regimen? no    Recent/future dental: no  Any missed doses?: no       Is patient compliant with meds? yes       Allergies: 1)  ! Penicillin 2)  ! * Oranges  Anticoagulation Management History:      The patient is taking warfarin and comes in today for a routine follow up visit.  Positive risk factors for bleeding include presence of serious comorbidities.  Negative risk factors for bleeding include an age less than 4 years old.  The bleeding index is 'intermediate risk'.  Positive CHADS2 values include History of CHF, History of HTN, and History of Diabetes.  Negative CHADS2 values include Age > 47 years old.  Anticoagulation responsible provider: Aundra Dubin MD, [place].  INR POC: 3.2.  Cuvette Lot#: [id number].  Exp: 10/2010.    Anticoagulation Management Assessment/Plan:      [name] current anticoagulation dose is Coumadin 10 mg tabs: as instructed.  The target INR is 2.0-3.0.  The next INR is due 10/26/2009.  Anticoagulation instructions were given to patient.  Results were reviewed/authorized by Tula Nakayama, RN, BSN.  He was notified by Tula Nakayama, RN, BSN.         Prior Anticoagulation Instructions: INR 1.6  Take 1 1/2 tablets today. Them resume your regular schedule of 1 tablet every day except take 1/2 tablet on Monday and Friday. Re-check INR in 1 week.   Current Anticoagulation Instructions: INR 3.2 Today only take 5mg s then resume 10mg s everyday except 5mg s on Mondays and Fridays. Recheck in 2 weeks.

## 2010-03-01 NOTE — Medication Information (Signed)
Summary: ROV/TM  Anticoagulant Therapy  Managed by: Margaretha [place], PharmD Referring MD: Lovena Le MD, Carleene Overlie PCP: Dawna Part MD Supervising MD: Lovena Le MD, Carleene Overlie Indication 1: Atrial Fibrillation Lab Used: [facility] [place] Site: [place] INR POC 1.6 INR RANGE 2.0-3.0  Dietary changes: no    Health status changes: no    Bleeding/hemorrhagic complications: no    Recent/future hospitalizations: no    Any changes in medication regimen? no    Recent/future dental: no  Any missed doses?: no       Is patient compliant with meds? yes       Allergies: 1)  ! Penicillin 2)  ! * Oranges  Anticoagulation Management History:      The patient is taking warfarin and comes in today for a routine follow up visit.  Positive risk factors for bleeding include presence of serious comorbidities.  Negative risk factors for bleeding include an age less than 36 years old.  The bleeding index is 'intermediate risk'.  Positive CHADS2 values include History of CHF, History of HTN, and History of Diabetes.  Negative CHADS2 values include Age > 47 years old.  Anticoagulation responsible provider: Lovena Le MD, Carleene Overlie.  INR POC: 1.6.  Cuvette Lot#: [id number].  Exp: 11/2010.    Anticoagulation Management Assessment/Plan:      [name] current anticoagulation dose is Coumadin 10 mg tabs: as instructed.  The target INR is 2.0-3.0.  The next INR is due 10/12/2009.  Anticoagulation instructions were given to patient.  Results were reviewed/authorized by Margaretha [place], PharmD.  He was notified by Sharol Harness, PharmD candidate.         Prior Anticoagulation Instructions: INR 2.1 Continue 10mg  daily except 5mg s on Mondays and Fridays. Recheck in 2 weeks.   Current Anticoagulation Instructions: INR 1.6  Take 1 1/2 tablets today. Them resume your regular schedule of 1 tablet every day except take 1/2 tablet on Monday and Friday. Re-check INR in 1 week.

## 2010-03-01 NOTE — Progress Notes (Signed)
Summary: med refill/gp  Phone Note Refill Request Message from:  Fax from Pharmacy on September 14, 2009 11:39 AM  Refills Requested: Medication #1:  ONETOUCH ULTRA TEST  STRP use to check CBG three times a day  Medication #2:   Pt. has Medicaid which requires all Prodigy supplies/meter.  Please send new Prodigy Rxs. with diagnosis codes Pt. will need a new Prodigy meter Rx.  Thanks   Method Requested: Fax to Local Pharmacy Initial call taken by: Morrison Old RN,  September 14, 2009 11:38 AM  Follow-up for Phone Call        Rx completed in Dr. Brantley Stage Follow-up by: Geanie Kenning MD,  September 14, 2009 8:01 PM    New/Updated Medications: PRODIGY PREFERRED MONITOR  DEVI (BLOOD GLUCOSE MONITORING SUPPL) As instructed PRODIGY NO CODING BLOOD GLUC  STRP (GLUCOSE BLOOD) Check CBG three times a day Prescriptions: PRODIGY PREFERRED MONITOR  DEVI (BLOOD GLUCOSE MONITORING SUPPL) As instructed  #1 x 0   Entered and Authorized by:   Geanie Kenning MD   Signed by:   Geanie Kenning MD on 09/14/2009   Method used:   Electronically to        [facility] (retail)       [address]       [place], [place]  [address]       Ph: [phone]       Fax: [phone]   RxID:   [id number] PRODIGY NO CODING BLOOD GLUC  STRP (GLUCOSE BLOOD) Check CBG three times a day  #90 x 5   Entered and Authorized by:   Geanie Kenning MD   Signed by:   Geanie Kenning MD on 09/14/2009   Method used:   Electronically to        [facility] (retail)       [address]       [place], [place]  [address]       Ph: [phone]       Fax: [phone]   RxID:   [id number]

## 2010-03-01 NOTE — Progress Notes (Signed)
Summary: refill/gg  Phone Note Refill Request  on March 28, 2009 3:05 PM  Refills Requested: Medication #1:  NOVOLOG FLEXPEN 100 UNIT/ML SOLN inject 10 units 15 minutes before meals three times daily   Last Refilled: 01/18/2009  Method Requested: Electronic Initial call taken by: Gevena Cotton RN,  March 28, 2009 3:06 PM    Prescriptions: NOVOLOG FLEXPEN 100 UNIT/ML SOLN (INSULIN ASPART) inject 10 units 15 minutes before meals three times daily  #1 box x 6   Entered and Authorized by:   Dawna Part MD   Signed by:   Dawna Part MD on 03/29/2009   Method used:   Electronically to        [facility] (retail)       [address]       [place], [place]  [address]       Ph: [phone]       Fax: [phone]   RxID:   [id number]

## 2010-03-01 NOTE — Progress Notes (Signed)
Summary: Refill/gh  Phone Note Refill Request Message from:  Fax from Pharmacy on October 05, 2009 11:20 AM  Refills Requested: Medication #1:  LANTUS SOLOSTAR 100 UNIT/ML SOLN 20 units once a day   Dosage confirmed as above?Dosage Confirmed   Last Refilled: 08/02/2009  Method Requested: Electronic Initial call taken by: Sander Nephew RN,  October 05, 2009 11:21 AM  Follow-up for Phone Call        Rx completed in Dr. Brantley Stage Follow-up by: Geanie Kenning MD,  October 05, 2009 2:52 PM    Prescriptions: LANTUS SOLOSTAR 100 UNIT/ML SOLN (INSULIN GLARGINE) 20 units once a day, subcutaneous  #1 vial x 5   Entered and Authorized by:   Geanie Kenning MD   Signed by:   Geanie Kenning MD on 10/05/2009   Method used:   Electronically to        [id number] (retail)       [address]       [place], [place]  [address]       Ph: [phone] or [phone]       Fax: [phone]   RxID:   [id number]   Appended Document: Refill/gh Pt was called said that he uses the Lantus Solostar [facility] and would like for the prescription to go to the [facility] on [address]. Sander Nephew, RN October 05, 2009 5:46 PM

## 2010-03-01 NOTE — Progress Notes (Signed)
Summary: Refill/Gout med flair up  Phone Note Call from Patient   Caller: Patient Call For: Geanie Kenning MD Summary of Call: Call from pt would like to get a refill on his Gout medication.  Would like to have the medication called to the [place].Sander Nephew RN  September 01, 2009 1:51 PM  Initial call taken by: Sander Nephew RN,  September 01, 2009 1:51 PM  Follow-up for Phone Call        Last seen 12/05/08 and was supposed to F/U in 2 weeks. Didn't keep H/F visit. Will refill one month but her needs an appt. I didn't send flag to [facility]. Follow-up by: Larey Dresser MD,  September 01, 2009 5:14 PM  Additional Follow-up for Phone Call Additional follow up Details #1::        Pt was called and infomed that he willneed to come in.  Pt said that he has an appointment  scheduled. Additional Follow-up by: Sander Nephew RN,  September 04, 2009 12:12 PM    Prescriptions: COLCHICINE 0.6 MG TABS (COLCHICINE) Take 1 tablet by mouth two times a day as needed.  #30 x 0   Entered and Authorized by:   Larey Dresser MD   Signed by:   Larey Dresser MD on 09/01/2009   Method used:   Electronically to        [facility] (retail)       [address]       [place], [place]  [address]       Ph: [phone]       Fax: [phone]   RxID:   [id number]

## 2010-03-01 NOTE — Progress Notes (Signed)
Summary: refill/ hla  Phone Note Refill Request Message from:  Fax from Pharmacy on March 01, 2009 12:05 PM  Refills Requested: Medication #1:  DIGOXIN 0.25 MG TABS Take 1 tablet by mouth once a day   Last Refilled: 12/22 Initial call taken by: Freddy Finner RN,  March 01, 2009 12:05 PM  Follow-up for Phone Call        Refilled electronically.  I have requested that an appt be scheduled with PCP. Follow-up by: Bertha Stakes MD,  March 01, 2009 3:39 PM    Prescriptions: DIGOXIN 0.25 MG TABS (DIGOXIN) Take 1 tablet by mouth once a day  #31 x 1   Entered and Authorized by:   Bertha Stakes MD   Signed by:   Bertha Stakes MD on 03/01/2009   Method used:   Electronically to        [facility] (retail)       [address]       [place], [place]  [address]       Ph: [phone]       Fax: [phone]   RxID:   [id number]

## 2010-03-01 NOTE — Miscellaneous (Signed)
Summary: Appointment Canceled  Appointment status changed to canceled by [employer] on 12/19/2009 11:10 AM.  Cancellation Comments --------------------- ekg/427.31/mt per after hours/mt  Appointment Information ----------------------- Appt Type:  CARDIOLOGY ANCILLARY VISIT      Date:  Friday, December 29, 2009      Time:  7:30 AM for 60 min   Urgency:  Routine   Made By:  Wynelle Cleveland  To Visit:  [id number]    Reason:  ekg/427.31/mt per after hours/mt  Appt Comments ------------- -- 12/19/09 11:10: ([facility]) CANCELED -- ekg/427.31/mt per after hours/mt -- 12/19/09 10:43: ([facility]) BOOKED -- Routine CARDIOLOGY ANCILLARY VISIT at 12/29/2009 7:30 AM for 60 min ekg/427.31/mt per after hours/mt

## 2010-03-01 NOTE — Medication Information (Signed)
Summary: ROV/MJ  Anticoagulant Therapy  Managed by: Porfirio Oar, PharmD Referring MD: Lovena Le MD, Carleene Overlie PCP: Geanie Kenning MD Supervising MD: Burt Knack MD, Legrand Como Indication 1: Atrial Fibrillation Lab Used: [facility] Site: [place] INR POC 3.3 INR RANGE 2.0-3.0  Dietary changes: no    Health status changes: no    Bleeding/hemorrhagic complications: no    Recent/future hospitalizations: yes       Details: D/c 11/12, shocked by "device" 3x 2/2 stress; pt was cardioverted w/ no intervention  Any changes in medication regimen? yes       Details: Loreli Slot was started in hospital and metoprolol was increased  Recent/future dental: no  Any missed doses?: no       Is patient compliant with meds? yes       Allergies: 1)  ! Penicillin 2)  ! * Oranges  Anticoagulation Management History:      The patient is taking warfarin and comes in today for a routine follow up visit.  Positive risk factors for bleeding include presence of serious comorbidities.  Negative risk factors for bleeding include an age less than 94 years old.  The bleeding index is 'intermediate risk'.  Positive CHADS2 values include History of CHF, History of HTN, and History of Diabetes.  Negative CHADS2 values include Age > 62 years old.  Anticoagulation responsible provider: Burt Knack MD, Legrand Como.  INR POC: 3.3.  Cuvette Lot#: [id number].  Exp: 12/2010.    Anticoagulation Management Assessment/Plan:      [name] current anticoagulation dose is Coumadin 10 mg tabs: as instructed.  The target INR is 2.0-3.0.  The next INR is due 01/01/2010.  Anticoagulation instructions were given to patient.  Results were reviewed/authorized by Porfirio Oar, PharmD.  He was notified by Carmin Richmond, PharmD Candidate.         Prior Anticoagulation Instructions: INR 2.8  Continue taking one tablet every day except for one-half tablet on Monday and Friday.  Recheck in three weeks.  Current Anticoagulation  Instructions: INR 3.3 Skip dose for today, then resume previous dose of 1 tablet everyday except 0.5 tablet on Monday and Friday Recheck INR in 2 weeks

## 2010-03-01 NOTE — Progress Notes (Signed)
Summary: Refikll/gh  Phone Note Refill Request Message from:  Fax from Pharmacy on Jun 19, 2009 10:16 AM  Refills Requested: Medication #1:  DIGOXIN 0.25 MG TABS Take 1 tablet by mouth once a day   Last Refilled: 01/18/2009  Medication #2:  NORVASC 5 MG TABS Take 1 tablet by mouth once a day   Last Refilled: 02/23/2009 Haospital discharge was 02/25/2009.   Method Requested: Electronic Initial call taken by: Sander Nephew RN,  Jun 19, 2009 10:16 AM  Follow-up for Phone Call        Needs to be seen in clinic for additional refills unless Dr. Caryn Section his PCP thinks he is reliable and will com in- multiple no shows and refills being given for meds that need to be monitored. Follow-up by: Rhea Pink  DO,  Jun 19, 2009 11:55 AM  Additional Follow-up for Phone Call Additional follow up Details #1::        Call to pt to schedule an appointment.  Pt is currently out of town and could not come for the appointment offered for Friday 06/23/2009.  Pt was informed that he will need to come in for an appointment and lab work before he could get refills.  Pt was also informed that the medications that he is taking require montoring.  Pt voiced understanding of and will make an appointment when he returns to [place]. Message faxed to pharmacy that pt needs an appointment before refills can be done. Additional Follow-up by: Sander Nephew RN,  Jun 20, 2009 9:41 AM

## 2010-03-01 NOTE — Miscellaneous (Signed)
Summary: Hosp D/C: Acute cholecystitis  Hospital Discharge  Date of admission: 02/21/2009  Date of discharge: 02/23/2009  Brief reason for admission/active problems: Patient admitted for nausea, vomiting, diarrhea and RUQ abdominal pain - acute onset the night prior to admission. Found to have leukocytosis and gallstones on RUQ U/S. Surgery consulted and they wanted internal medicine to admit given PMH. Patient had lap chole performed the following morning and was discharged the day after surgery.  Followup needed: 1. Patient to f/u as needed in the [facility]. He has an appointment with Dr. Donne Hazel, surgeon in 2-3 weeks for f/u s/p surgery. He was sent out with 5 days of Cipro/Flagyl. Dr. Donne Hazel provided the patient with limited amount of percocet for pain relief as needed.  The medication and problem lists have been updated.  Please see the dictated discharge summary for details.

## 2010-03-01 NOTE — Progress Notes (Signed)
Summary: refill/ hla  Phone Note Refill Request Message from:  Fax from Pharmacy on Jun 21, 2009 11:18 AM  Refills Requested: Medication #1:  DIGOXIN 0.25 MG TABS Take 1 tablet by mouth once a day   Last Refilled: 12/22 Initial call taken by: Freddy Finner RN,  Jun 21, 2009 11:19 AM    Prescriptions: DIGOXIN 0.25 MG TABS (DIGOXIN) Take 1 tablet by mouth once a day  #31 x 1   Entered and Authorized by:   Dawna Part MD   Signed by:   Dawna Part MD on 06/21/2009   Method used:   Electronically to        [facility] (retail)       [address]       [place], [place]  [address]       Ph: [phone]       Fax: [phone]   RxID:   [id number]

## 2010-03-01 NOTE — Progress Notes (Signed)
Summary: refill/gg  Phone Note Refill Request  on April 28, 2009 10:14 AM  Refills Requested: Medication #1:  NORVASC 5 MG TABS Take 1 tablet by mouth once a day  Method Requested: Telephone to Pharmacy Initial call taken by: Gevena Cotton RN,  April 28, 2009 10:14 AM  Follow-up for Phone Call        Norvasc is listed on his med list but not his recent D/C summary which is his most recent interaction with [place].  Dr Marinda Elk asked for a F/U appt to be made back in Feb and the pt no showed that appt.  Can we pls reschedule the pt?  As for the Norvasc, please check with Dr Amalia Hailey who D/C'd the pt as to whether the Norvasc was D/C'd or continued at discharge. Follow-up by: Larey Dresser MD,  May 04, 2009 3:48 PM  Additional Follow-up for Phone Call Additional follow up Details #1::        We need to see him in clinic to review his meds - I remember that he had recently lost a lot of weight, possible that he did not have this as a med anymore if his BP was improved - I do not remember changing any medications for him at the time of d/c - so I either missed it on his d/c summary or he was not taking it at that time. I would reschedule him for an appointment prior to refilling it. Additional Follow-up by: Luvenia Starch MD,  May 04, 2009 3:55 PM    Additional Follow-up for Phone Call Additional follow up Details #2::    Unable to reach pt by phone.  Letter sent to home asking pt to schedule office  Will hold med until pt is reached/per Dr Rochele Pages RN  May 08, 2009 9:07 AM   Prescriptions: NORVASC 5 MG TABS (AMLODIPINE BESYLATE) Take 1 tablet by mouth once a day  #30 x 11   Entered and Authorized by:   Dawna Part MD   Signed by:   Dawna Part MD on 05/08/2009   Method used:   Telephoned to ...       [facility] (retail)       [address] [place], [place]  [address]       Ph: [phone]       Fax: [phone]   RxID:    [id number]  Still havae not had response from letter that was mailed.  Norvasc was NOT called in.  Will wait for pt to come in for office visit.

## 2010-03-01 NOTE — Medication Information (Signed)
Summary: rov/ewj  Anticoagulant Therapy  Managed by: Tula Nakayama, RN, BSN Referring MD: Lovena Le MD, Carleene Overlie PCP: Dawna Part MD Supervising MD: Percival Spanish MD, Jeneen Rinks Indication 1: Atrial Fibrillation Lab Used: [facility] [place] Site: [place] INR POC 2.1 INR RANGE 2.0-3.0  Dietary changes: no    Health status changes: no    Bleeding/hemorrhagic complications: no    Recent/future hospitalizations: no    Any changes in medication regimen? no    Recent/future dental: yes     Details: Sept 19th dental cleaning  Any missed doses?: no       Is patient compliant with meds? yes       Allergies: 1)  ! Penicillin 2)  ! * Oranges  Anticoagulation Management History:      The patient is taking warfarin and comes in today for a routine follow up visit.  Positive risk factors for bleeding include presence of serious comorbidities.  Negative risk factors for bleeding include an age less than 84 years old.  The bleeding index is 'intermediate risk'.  Positive CHADS2 values include History of CHF, History of HTN, and History of Diabetes.  Negative CHADS2 values include Age > 79 years old.  Anticoagulation responsible provider: Percival Spanish MD, Jeneen Rinks.  INR POC: 2.1.  Cuvette Lot#: [id number].  Exp: 10/2010.    Anticoagulation Management Assessment/Plan:      [name] current anticoagulation dose is Coumadin 10 mg tabs: as instructed.  The target INR is 2.0-3.0.  The next INR is due 10/06/2009.  Anticoagulation instructions were given to patient.  Results were reviewed/authorized by Tula Nakayama, RN, BSN.  He was notified by Tula Nakayama, RN, BSN.         Prior Anticoagulation Instructions: INR 2.2  Continue on same dosage 1 tablet daily except 1/2 tablet on Mondays and Fridays.  Recheck in 1 week.    Current Anticoagulation Instructions: INR 2.1 Continue 10mg  daily except 5mg s on Mondays and Fridays. Recheck in 2 weeks.

## 2010-03-01 NOTE — Letter (Signed)
Summary: Appointment - Missed  [facility] [place]  [address] [place]   [place], [place] [address]   Phone: [phone]  Fax: [phone]     February 05, 2010 MRN: [id number]   Corey Palmer [address] [place], [place]  [address]   Dear [name],  Our records indicate you missed your appointments on  01-01-10 at 10a for Coumadin, 01-01-10 at 11a for an EKG, 12-20 at [place] for lab work, and 12-20 at [place] with Dr. Lovena Le .  It is very important that we reach you to reschedule these appointments. We look forward to participating in your health care needs. Please contact us at the number listed above at your earliest convenience to reschedule these  appointments.     Sincerely,    Public relations account executive

## 2010-03-04 ENCOUNTER — Encounter: Payer: Self-pay | Admitting: Internal Medicine

## 2010-03-04 ENCOUNTER — Emergency Department (HOSPITAL_COMMUNITY)
Admission: EM | Admit: 2010-03-04 | Discharge: 2010-03-04 | Disposition: A | Discharge status: Home / Self Care | Payer: Medicaid Other | Attending: Emergency Medicine | Admitting: Emergency Medicine

## 2010-03-04 DIAGNOSIS — Z794 Long term (current) use of insulin: Secondary | ICD-10-CM | POA: Insufficient documentation

## 2010-03-04 DIAGNOSIS — E119 Type 2 diabetes mellitus without complications: Secondary | ICD-10-CM | POA: Insufficient documentation

## 2010-03-04 DIAGNOSIS — Z79899 Other long term (current) drug therapy: Secondary | ICD-10-CM | POA: Insufficient documentation

## 2010-03-04 DIAGNOSIS — I10 Essential (primary) hypertension: Secondary | ICD-10-CM | POA: Insufficient documentation

## 2010-03-04 DIAGNOSIS — I509 Heart failure, unspecified: Secondary | ICD-10-CM | POA: Insufficient documentation

## 2010-03-04 DIAGNOSIS — I4891 Unspecified atrial fibrillation: Secondary | ICD-10-CM | POA: Insufficient documentation

## 2010-03-04 DIAGNOSIS — M25569 Pain in unspecified knee: Secondary | ICD-10-CM | POA: Insufficient documentation

## 2010-03-04 DIAGNOSIS — E7800 Pure hypercholesterolemia, unspecified: Secondary | ICD-10-CM | POA: Insufficient documentation

## 2010-03-04 DIAGNOSIS — I2510 Atherosclerotic heart disease of native coronary artery without angina pectoris: Secondary | ICD-10-CM | POA: Insufficient documentation

## 2010-03-04 DIAGNOSIS — M109 Gout, unspecified: Secondary | ICD-10-CM | POA: Insufficient documentation

## 2010-03-04 DIAGNOSIS — M25469 Effusion, unspecified knee: Secondary | ICD-10-CM | POA: Insufficient documentation

## 2010-03-04 LAB — SYNOVIAL CELL COUNT + DIFF, W/ CRYSTALS: Crystals, Fluid: NONE SEEN

## 2010-03-04 LAB — GRAM STAIN

## 2010-03-07 NOTE — Progress Notes (Addendum)
Summary: test strips for new medicaid meter/ hla  Phone Note Refill Request Message from:  Patient on February 26, 2010 4:56 PM  Refills Requested: Medication #1:  test strips Acucheck Meter  Medication #2:  Lancets for Accucheck last visit 09/2009  Initial call taken by: Freddy Finner RN,  February 26, 2010 4:56 PM  Follow-up for Phone Call        Rx written Follow-up by: Geanie Kenning MD,  February 27, 2010 12:05 PM    New/Updated Medications: ACCU-CHEK ADVANTAGE TEST  STRP (GLUCOSE BLOOD) check CBG four times a day ACCU-CHEK SOFT TOUCH LANCETS  MISC (LANCETS) check CBG four time  aday Prescriptions: ACCU-CHEK SOFT TOUCH LANCETS  MISC (LANCETS) check CBG four time  aday  #1 month x 5   Entered and Authorized by:   Geanie Kenning MD   Signed by:   Geanie Kenning MD on 02/27/2010   Method used:   Electronically to        [id number] (retail)       [address]       [place], [place]  [address]       Ph: [phone]       Fax: [phone]   RxID:   [id number] ACCU-CHEK ADVANTAGE TEST  STRP (GLUCOSE BLOOD) check CBG four times a day  #1 month x 5   Entered and Authorized by:   Geanie Kenning MD   Signed by:   Geanie Kenning MD on 02/27/2010   Method used:   Electronically to        [facility]. [id number] (retail)       [address]       [place], [place]  [address]       Ph: [phone]       Fax: [phone]   RxID:   [id number]

## 2010-03-08 LAB — BODY FLUID CULTURE: Culture: NO GROWTH

## 2010-03-12 ENCOUNTER — Telehealth: Payer: Self-pay | Admitting: *Deleted

## 2010-03-12 ENCOUNTER — Other Ambulatory Visit: Payer: Self-pay | Admitting: Internal Medicine

## 2010-03-12 MED ORDER — MELOXICAM 15 MG PO TABS: 15.0000 mg | ORAL_TABLET | Freq: Every day | ORAL | Status: DC

## 2010-03-12 NOTE — Telephone Encounter (Signed)
Message copied by Sander Nephew on Mon Mar 12, 2010  4:24 PM ------      Message from: Rosalia Hammers      Created: Mon Mar 12, 2010  2:31 PM      Regarding: Refill       Please let patient know that I [facility] [place].            Thank you.

## 2010-03-12 NOTE — Telephone Encounter (Signed)
Call to inform pt that the Meloxicam prescription has been approved and [facility] [address].  No answer at pt's home number.   Unable to leave a message.

## 2010-03-12 NOTE — Telephone Encounter (Signed)
Call to inform insurance provider that pt has been on Ibuprofen before and it is now contraindicated since he is on Coumadin.  Medication has been Prior Authorized starting 03/12/2010 thru 03/12/2011 for Meloxicam 15 mg tablets.  Meloxicam 15 mg tablets will need to be reordered.

## 2010-03-15 NOTE — Miscellaneous (Signed)
Summary: Phone note  Patient called with a report of his knee gout flare." would like to have "it drained." Reprots taking all his meds as Rx-ed including colcrys, uloric and Ibuprofen. I explained to the patient that he should go to [facility] if pain is not improved. Patient verbalized understanding.

## 2010-03-19 ENCOUNTER — Other Ambulatory Visit: Payer: Self-pay | Admitting: *Deleted

## 2010-03-19 ENCOUNTER — Encounter (INDEPENDENT_AMBULATORY_CARE_PROVIDER_SITE_OTHER): Payer: Self-pay | Admitting: Pharmacist

## 2010-03-19 DIAGNOSIS — Z7901 Long term (current) use of anticoagulants: Secondary | ICD-10-CM

## 2010-03-19 DIAGNOSIS — I4891 Unspecified atrial fibrillation: Secondary | ICD-10-CM

## 2010-03-20 MED ORDER — INSULIN PEN NEEDLE 31G X 8 MM MISC: Status: DC

## 2010-03-27 NOTE — Letter (Signed)
Summary: [facility]  [address] [place]   [place], [place] [address]   Phone: [phone]  Fax: [phone]     March 19, 2010 MRN: [id number]   Corey Palmer [address] [place], [place]  [address]   Dear Mr. Loeza,  This letter is being sent to you as a reminder that it is necessary for you to get your INR/PT checked regularly so that we can optimize your care.  Our records indicate that you were scheduled to have a test done recently.  As of today, we have not received the results of this test.  It is very important that you have your INR checked.  Please call our office at the number listed above to schedule an appointment at your earliest convenience.    If you have recently had your protime checked or have discontinued this medication, please contact our office at the above phone number to clarify this issue.  Thank you for this prompt attention to this important health care matter.  Sincerely,   [facility]

## 2010-04-10 ENCOUNTER — Encounter: Payer: Medicaid Other | Admitting: Internal Medicine

## 2010-04-10 LAB — BASIC METABOLIC PANEL
BUN: 18 mg/dL (ref 6–23)
BUN: 8 mg/dL (ref 6–23)
CO2: 24 mEq/L (ref 19–32)
CO2: 25 mEq/L (ref 19–32)
Chloride: 102 mEq/L (ref 96–112)
Chloride: 103 mEq/L (ref 96–112)
Chloride: 104 mEq/L (ref 96–112)
Creatinine, Ser: 0.83 mg/dL (ref 0.4–1.5)
Creatinine, Ser: 0.89 mg/dL (ref 0.4–1.5)
Creatinine, Ser: 0.95 mg/dL (ref 0.4–1.5)
GFR calc Af Amer: >60 mL/min (ref >60)
Glucose, Bld: 162 mg/dL — ABNORMAL HIGH (ref 70–99)
Potassium: 5 mEq/L (ref 3.5–5.1)

## 2010-04-10 LAB — GLUCOSE, CAPILLARY
Glucose-Capillary: 177 mg/dL — ABNORMAL HIGH (ref 70–99)
Glucose-Capillary: 227 mg/dL — ABNORMAL HIGH (ref 70–99)
Glucose-Capillary: 286 mg/dL — ABNORMAL HIGH (ref 70–99)
Glucose-Capillary: 314 mg/dL — ABNORMAL HIGH (ref 70–99)
Glucose-Capillary: 339 mg/dL — ABNORMAL HIGH (ref 70–99)
Glucose-Capillary: 528 mg/dL — ABNORMAL HIGH (ref 70–99)
Glucose-Capillary: 549 mg/dL — ABNORMAL HIGH (ref 70–99)

## 2010-04-10 LAB — GLUCOSE, RANDOM: Glucose, Bld: 508 mg/dL — ABNORMAL HIGH (ref 70–99)

## 2010-04-10 LAB — POCT I-STAT, CHEM 8
Calcium, Ion: 1.14 mmol/L (ref 1.12–1.32)
Glucose, Bld: 368 mg/dL — ABNORMAL HIGH (ref 70–99)
HCT: 46 % (ref 39.0–52.0)
Hemoglobin: 15.6 g/dL (ref 13.0–17.0)
TCO2: 23 mmol/L (ref 0–100)

## 2010-04-10 LAB — DIFFERENTIAL
Basophils Relative: 0 % (ref 0–1)
Eosinophils Absolute: 0.1 10*3/uL (ref 0.0–0.7)
Lymphs Abs: 1.8 10*3/uL (ref 0.7–4.0)
Monocytes Absolute: 0.5 10*3/uL (ref 0.1–1.0)
Monocytes Relative: 7 % (ref 3–12)

## 2010-04-10 LAB — CBC
HCT: 44.5 % (ref 39.0–52.0)
HCT: 44.5 % (ref 39.0–52.0)
Hemoglobin: 15.6 g/dL (ref 13.0–17.0)
MCH: 28.4 pg (ref 26.0–34.0)
MCH: 28.7 pg (ref 26.0–34.0)
MCHC: 34.6 g/dL (ref 30.0–36.0)
MCHC: 35.1 g/dL (ref 30.0–36.0)
MCV: 83.1 fL (ref 78.0–100.0)
MCV: 83.6 fL (ref 78.0–100.0)
MCV: 83.8 fL (ref 78.0–100.0)
Platelets: 229 10*3/uL (ref 150–400)
Platelets: 243 10*3/uL (ref 150–400)
Platelets: 253 10*3/uL (ref 150–400)
RBC: 5.11 MIL/uL (ref 4.22–5.81)
RBC: 5.32 MIL/uL (ref 4.22–5.81)
RDW: 12.9 % (ref 11.5–15.5)
WBC: 7.9 10*3/uL (ref 4.0–10.5)
WBC: 8.4 10*3/uL (ref 4.0–10.5)

## 2010-04-10 LAB — PROTIME-INR
INR: 1.27 (ref 0.00–1.49)
INR: 2.67 — ABNORMAL HIGH (ref 0.00–1.49)

## 2010-04-10 LAB — MRSA PCR SCREENING: MRSA by PCR: NEGATIVE

## 2010-04-10 LAB — HEPARIN LEVEL (UNFRACTIONATED): Heparin Unfractionated: 0.13 IU/mL — ABNORMAL LOW (ref 0.30–0.70)

## 2010-04-12 LAB — GLUCOSE, CAPILLARY
Glucose-Capillary: 159 mg/dL — ABNORMAL HIGH (ref 70–99)
Glucose-Capillary: 456 mg/dL — ABNORMAL HIGH (ref 70–99)

## 2010-04-13 LAB — COMPREHENSIVE METABOLIC PANEL
ALT: 55 U/L — ABNORMAL HIGH (ref 0–53)
AST: 34 U/L (ref 0–37)
Albumin: 3.7 g/dL (ref 3.5–5.2)
Alkaline Phosphatase: 74 U/L (ref 39–117)
BUN: 8 mg/dL (ref 6–23)
Chloride: 103 mEq/L (ref 96–112)
Potassium: 3.9 mEq/L (ref 3.5–5.1)
Sodium: 137 mEq/L (ref 135–145)
Total Bilirubin: 0.6 mg/dL (ref 0.3–1.2)
Total Protein: 6.8 g/dL (ref 6.0–8.3)

## 2010-04-13 LAB — DIFFERENTIAL
Basophils Absolute: 0 10*3/uL (ref 0.0–0.1)
Basophils Absolute: 0 10*3/uL (ref 0.0–0.1)
Basophils Relative: 0 % (ref 0–1)
Eosinophils Absolute: 0.1 10*3/uL (ref 0.0–0.7)
Eosinophils Relative: 2 % (ref 0–5)
Lymphocytes Relative: 33 % (ref 12–46)
Lymphs Abs: 2.2 10*3/uL (ref 0.7–4.0)
Monocytes Absolute: 0.5 10*3/uL (ref 0.1–1.0)
Monocytes Relative: 9 % (ref 3–12)
Neutro Abs: 3 10*3/uL (ref 1.7–7.7)
Neutrophils Relative %: 55 % (ref 43–77)

## 2010-04-13 LAB — CBC
MCV: 80.9 fL (ref 78.0–100.0)
Platelets: 193 10*3/uL (ref 150–400)
Platelets: 199 10*3/uL (ref 150–400)
RBC: 5.29 MIL/uL (ref 4.22–5.81)
RBC: 5.4 MIL/uL (ref 4.22–5.81)
RDW: 12.7 % (ref 11.5–15.5)
WBC: 5.4 10*3/uL (ref 4.0–10.5)
WBC: 6.8 10*3/uL (ref 4.0–10.5)

## 2010-04-13 LAB — PROTIME-INR: Prothrombin Time: 18.3 seconds — ABNORMAL HIGH (ref 11.6–15.2)

## 2010-04-13 LAB — GLUCOSE, CAPILLARY
Glucose-Capillary: 243 mg/dL — ABNORMAL HIGH (ref 70–99)
Glucose-Capillary: 281 mg/dL — ABNORMAL HIGH (ref 70–99)

## 2010-04-13 LAB — BRAIN NATRIURETIC PEPTIDE: Pro B Natriuretic peptide (BNP): 375 pg/mL — ABNORMAL HIGH (ref 0.0–100.0)

## 2010-04-13 LAB — URINALYSIS, ROUTINE W REFLEX MICROSCOPIC
Bilirubin Urine: NEGATIVE
Ketones, ur: NEGATIVE mg/dL
Nitrite: NEGATIVE
Urobilinogen, UA: 0.2 mg/dL (ref 0.0–1.0)
pH: 5.5 (ref 5.0–8.0)

## 2010-04-13 LAB — HEPARIN LEVEL (UNFRACTIONATED): Heparin Unfractionated: 0.42 IU/mL (ref 0.30–0.70)

## 2010-04-13 LAB — POCT CARDIAC MARKERS
CKMB, poc: 2.2 ng/mL (ref 1.0–8.0)
Troponin i, poc: <0.05 ng/mL (ref 0.00–0.09)

## 2010-04-13 LAB — APTT: aPTT: 42 seconds — ABNORMAL HIGH (ref 24–37)

## 2010-04-13 LAB — URINE MICROSCOPIC-ADD ON

## 2010-04-14 LAB — CBC
HCT: 44.8 % (ref 39.0–52.0)
HCT: 45.5 % (ref 39.0–52.0)
HCT: 46.7 % (ref 39.0–52.0)
Hemoglobin: 15.5 g/dL (ref 13.0–17.0)
Hemoglobin: 16.8 g/dL (ref 13.0–17.0)
MCH: 29.6 pg (ref 26.0–34.0)
MCH: 29.6 pg (ref 26.0–34.0)
MCH: 30 pg (ref 26.0–34.0)
MCHC: 34.8 g/dL (ref 30.0–36.0)
MCV: 85.1 fL (ref 78.0–100.0)
MCV: 85.8 fL (ref 78.0–100.0)
MCV: 86.1 fL (ref 78.0–100.0)
Platelets: 176 10*3/uL (ref 150–400)
Platelets: 207 10*3/uL (ref 150–400)
Platelets: 216 10*3/uL (ref 150–400)
RBC: 5.34 MIL/uL (ref 4.22–5.81)
RDW: 13.5 % (ref 11.5–15.5)
RDW: 13.8 % (ref 11.5–15.5)
RDW: 13.8 % (ref 11.5–15.5)
WBC: 5.8 10*3/uL (ref 4.0–10.5)
WBC: 6.7 10*3/uL (ref 4.0–10.5)
WBC: 7.4 10*3/uL (ref 4.0–10.5)

## 2010-04-14 LAB — GLUCOSE, CAPILLARY
Glucose-Capillary: 188 mg/dL — ABNORMAL HIGH (ref 70–99)
Glucose-Capillary: 218 mg/dL — ABNORMAL HIGH (ref 70–99)
Glucose-Capillary: 230 mg/dL — ABNORMAL HIGH (ref 70–99)
Glucose-Capillary: 240 mg/dL — ABNORMAL HIGH (ref 70–99)
Glucose-Capillary: 277 mg/dL — ABNORMAL HIGH (ref 70–99)

## 2010-04-14 LAB — PROTIME-INR
INR: 1.18 (ref 0.00–1.49)
Prothrombin Time: 13 seconds (ref 11.6–15.2)
Prothrombin Time: 13.2 seconds (ref 11.6–15.2)

## 2010-04-14 LAB — BASIC METABOLIC PANEL
BUN: 12 mg/dL (ref 6–23)
BUN: 12 mg/dL (ref 6–23)
BUN: 15 mg/dL (ref 6–23)
CO2: 26 mEq/L (ref 19–32)
Calcium: 9 mg/dL (ref 8.4–10.5)
Chloride: 102 mEq/L (ref 96–112)
Chloride: 102 mEq/L (ref 96–112)
Creatinine, Ser: 0.84 mg/dL (ref 0.4–1.5)
Creatinine, Ser: 0.89 mg/dL (ref 0.4–1.5)
GFR calc Af Amer: >60 mL/min (ref >60)
GFR calc non Af Amer: >60 mL/min (ref >60)
GFR calc non Af Amer: >60 mL/min (ref >60)
Glucose, Bld: 188 mg/dL — ABNORMAL HIGH (ref 70–99)
Glucose, Bld: 213 mg/dL — ABNORMAL HIGH (ref 70–99)
Potassium: 4 mEq/L (ref 3.5–5.1)

## 2010-04-14 LAB — POCT CARDIAC MARKERS
CKMB, poc: 1.4 ng/mL (ref 1.0–8.0)
Troponin i, poc: <0.05 ng/mL (ref 0.00–0.09)

## 2010-04-14 LAB — HEPARIN LEVEL (UNFRACTIONATED)
Heparin Unfractionated: 0.15 IU/mL — ABNORMAL LOW (ref 0.30–0.70)
Heparin Unfractionated: 0.26 IU/mL — ABNORMAL LOW (ref 0.30–0.70)
Heparin Unfractionated: 0.27 IU/mL — ABNORMAL LOW (ref 0.30–0.70)
Heparin Unfractionated: 0.43 IU/mL (ref 0.30–0.70)
Heparin Unfractionated: <0.1 IU/mL — ABNORMAL LOW (ref 0.30–0.70)

## 2010-04-14 LAB — DIGOXIN LEVEL: Digoxin Level: 0.2 ng/mL — ABNORMAL LOW (ref 0.8–2.0)

## 2010-04-14 LAB — DIFFERENTIAL
Basophils Absolute: 0 10*3/uL (ref 0.0–0.1)
Basophils Absolute: 0 10*3/uL (ref 0.0–0.1)
Eosinophils Absolute: 0.1 10*3/uL (ref 0.0–0.7)
Eosinophils Relative: 2 % (ref 0–5)
Lymphocytes Relative: 27 % (ref 12–46)
Lymphs Abs: 1.8 10*3/uL (ref 0.7–4.0)
Monocytes Absolute: 0.5 10*3/uL (ref 0.1–1.0)
Monocytes Absolute: 0.6 10*3/uL (ref 0.1–1.0)
Neutro Abs: 4.2 10*3/uL (ref 1.7–7.7)

## 2010-04-14 LAB — MAGNESIUM: Magnesium: 1.7 mg/dL (ref 1.5–2.5)

## 2010-04-14 LAB — TSH: TSH: 3.36 u[IU]/mL (ref 0.350–4.500)

## 2010-04-14 LAB — APTT: aPTT: 37 seconds (ref 24–37)

## 2010-04-15 LAB — COMPREHENSIVE METABOLIC PANEL
AST: 31 U/L (ref 0–37)
Albumin: 3.9 g/dL (ref 3.5–5.2)
Alkaline Phosphatase: 97 U/L (ref 39–117)
CO2: 18 mEq/L — ABNORMAL LOW (ref 19–32)
Chloride: 103 mEq/L (ref 96–112)
Creatinine, Ser: 0.88 mg/dL (ref 0.4–1.5)
GFR calc Af Amer: >60 mL/min (ref >60)
GFR calc non Af Amer: >60 mL/min (ref >60)
Potassium: 4 mEq/L (ref 3.5–5.1)
Total Bilirubin: 1.1 mg/dL (ref 0.3–1.2)

## 2010-04-15 LAB — CK TOTAL AND CKMB (NOT AT ARMC)
Relative Index: INVALID (ref 0.0–2.5)
Total CK: 60 U/L (ref 7–232)

## 2010-04-15 LAB — BASIC METABOLIC PANEL
BUN: 12 mg/dL (ref 6–23)
CO2: 26 mEq/L (ref 19–32)
Calcium: 7.1 mg/dL — ABNORMAL LOW (ref 8.4–10.5)
Chloride: 100 mEq/L (ref 96–112)
Chloride: 103 mEq/L (ref 96–112)
GFR calc Af Amer: >60 mL/min (ref >60)
GFR calc non Af Amer: >60 mL/min (ref >60)
Glucose, Bld: 219 mg/dL — ABNORMAL HIGH (ref 70–99)
Potassium: 3.1 mEq/L — ABNORMAL LOW (ref 3.5–5.1)
Potassium: 3.2 mEq/L — ABNORMAL LOW (ref 3.5–5.1)
Sodium: 132 mEq/L — ABNORMAL LOW (ref 135–145)
Sodium: 139 mEq/L (ref 135–145)

## 2010-04-15 LAB — URINALYSIS, ROUTINE W REFLEX MICROSCOPIC
Bilirubin Urine: NEGATIVE
Ketones, ur: 15 mg/dL — AB
Leukocytes, UA: NEGATIVE
Nitrite: NEGATIVE
Protein, ur: >300 mg/dL — AB
Urobilinogen, UA: 0.2 mg/dL (ref 0.0–1.0)

## 2010-04-15 LAB — DIFFERENTIAL
Basophils Absolute: 0 10*3/uL (ref 0.0–0.1)
Basophils Relative: 0 % (ref 0–1)
Eosinophils Absolute: 0.2 10*3/uL (ref 0.0–0.7)
Eosinophils Relative: 1 % (ref 0–5)
Lymphocytes Relative: 6 % — ABNORMAL LOW (ref 12–46)
Monocytes Absolute: 0.8 10*3/uL (ref 0.1–1.0)

## 2010-04-15 LAB — CBC
HCT: 53.5 % — ABNORMAL HIGH (ref 39.0–52.0)
Hemoglobin: 13.4 g/dL (ref 13.0–17.0)
Hemoglobin: 15.2 g/dL (ref 13.0–17.0)
MCHC: 35 g/dL (ref 30.0–36.0)
MCV: 84.1 fL (ref 78.0–100.0)
MCV: 84.4 fL (ref 78.0–100.0)
Platelets: 161 10*3/uL (ref 150–400)
Platelets: 214 10*3/uL (ref 150–400)
RBC: 4.57 MIL/uL (ref 4.22–5.81)
RBC: 6.33 MIL/uL — ABNORMAL HIGH (ref 4.22–5.81)
RDW: 12.8 % (ref 11.5–15.5)
WBC: 14.5 10*3/uL — ABNORMAL HIGH (ref 4.0–10.5)
WBC: 6.2 10*3/uL (ref 4.0–10.5)

## 2010-04-15 LAB — GLUCOSE, CAPILLARY: Glucose-Capillary: 283 mg/dL — ABNORMAL HIGH (ref 70–99)

## 2010-04-15 LAB — LIPASE, BLOOD: Lipase: 19 U/L (ref 11–59)

## 2010-04-15 LAB — TROPONIN I: Troponin I: 0.03 ng/mL (ref 0.00–0.06)

## 2010-04-15 LAB — CARDIAC PANEL(CRET KIN+CKTOT+MB+TROPI)
CK, MB: 1.6 ng/mL (ref 0.3–4.0)
Total CK: 72 U/L (ref 7–232)
Troponin I: 0.02 ng/mL (ref 0.00–0.06)

## 2010-04-15 LAB — PROTIME-INR: Prothrombin Time: 13 seconds (ref 11.6–15.2)

## 2010-04-15 LAB — AMYLASE: Amylase: 27 U/L (ref 0–105)

## 2010-05-02 LAB — GLUCOSE, CAPILLARY: Glucose-Capillary: 397 mg/dL — ABNORMAL HIGH (ref 70–99)

## 2010-05-05 LAB — GLUCOSE, CAPILLARY
Glucose-Capillary: 336 mg/dL — ABNORMAL HIGH (ref 70–99)
Glucose-Capillary: 378 mg/dL — ABNORMAL HIGH (ref 70–99)

## 2010-05-10 LAB — URINE MICROSCOPIC-ADD ON

## 2010-05-10 LAB — CBC
Hemoglobin: 13.7 g/dL (ref 13.0–17.0)
RDW: 15 % (ref 11.5–15.5)

## 2010-05-10 LAB — URINALYSIS, ROUTINE W REFLEX MICROSCOPIC
Glucose, UA: NEGATIVE mg/dL
Hgb urine dipstick: NEGATIVE
Ketones, ur: NEGATIVE mg/dL
Protein, ur: 30 mg/dL — AB

## 2010-05-10 LAB — GLUCOSE, CAPILLARY

## 2010-05-10 LAB — DIFFERENTIAL
Basophils Relative: 0 % (ref 0–1)
Eosinophils Absolute: 0.1 10*3/uL (ref 0.0–0.7)
Eosinophils Relative: 1 % (ref 0–5)
Monocytes Absolute: 1.3 10*3/uL — ABNORMAL HIGH (ref 0.1–1.0)
Monocytes Relative: 13 % — ABNORMAL HIGH (ref 3–12)

## 2010-05-10 LAB — CK TOTAL AND CKMB (NOT AT ARMC)
CK, MB: 1 ng/mL (ref 0.3–4.0)
Total CK: 12 U/L (ref 7–232)

## 2010-05-10 LAB — COMPREHENSIVE METABOLIC PANEL
ALT: 26 U/L (ref 0–53)
Albumin: 3.3 g/dL — ABNORMAL LOW (ref 3.5–5.2)
Alkaline Phosphatase: 157 U/L — ABNORMAL HIGH (ref 39–117)
Glucose, Bld: 196 mg/dL — ABNORMAL HIGH (ref 70–99)
Potassium: 4.3 mEq/L (ref 3.5–5.1)
Sodium: 135 mEq/L (ref 135–145)
Total Protein: 7.7 g/dL (ref 6.0–8.3)

## 2010-05-15 LAB — URINE MICROSCOPIC-ADD ON

## 2010-05-15 LAB — POCT I-STAT, CHEM 8
BUN: 16 mg/dL (ref 6–23)
Calcium, Ion: 1.08 mmol/L — ABNORMAL LOW (ref 1.12–1.32)
Glucose, Bld: 226 mg/dL — ABNORMAL HIGH (ref 70–99)
TCO2: 20 mmol/L (ref 0–100)

## 2010-05-15 LAB — CULTURE, BLOOD (ROUTINE X 2): Culture: NO GROWTH

## 2010-05-15 LAB — POCT CARDIAC MARKERS
CKMB, poc: 1.1 ng/mL (ref 1.0–8.0)
CKMB, poc: 1.3 ng/mL (ref 1.0–8.0)
Myoglobin, poc: 83.4 ng/mL (ref 12–200)
Troponin i, poc: <0.05 ng/mL (ref 0.00–0.09)
Troponin i, poc: <0.05 ng/mL (ref 0.00–0.09)
Troponin i, poc: <0.05 ng/mL (ref 0.00–0.09)

## 2010-05-15 LAB — APTT: aPTT: 38 seconds — ABNORMAL HIGH (ref 24–37)

## 2010-05-15 LAB — URINE CULTURE: Culture: NO GROWTH

## 2010-05-15 LAB — CBC
HCT: 37.3 % — ABNORMAL LOW (ref 39.0–52.0)
HCT: 39.6 % (ref 39.0–52.0)
HCT: 40.1 % (ref 39.0–52.0)
HCT: 40.9 % (ref 39.0–52.0)
HCT: 41.6 % (ref 39.0–52.0)
Hemoglobin: 12.6 g/dL — ABNORMAL LOW (ref 13.0–17.0)
Hemoglobin: 13.2 g/dL (ref 13.0–17.0)
Hemoglobin: 13.2 g/dL (ref 13.0–17.0)
Hemoglobin: 13.8 g/dL (ref 13.0–17.0)
Hemoglobin: 13.9 g/dL (ref 13.0–17.0)
MCHC: 33.4 g/dL (ref 30.0–36.0)
MCHC: 33.4 g/dL (ref 30.0–36.0)
MCHC: 33.6 g/dL (ref 30.0–36.0)
MCHC: 33.7 g/dL (ref 30.0–36.0)
MCV: 80.6 fL (ref 78.0–100.0)
MCV: 80.9 fL (ref 78.0–100.0)
MCV: 81.8 fL (ref 78.0–100.0)
Platelets: 271 10*3/uL (ref 150–400)
RBC: 4.63 MIL/uL (ref 4.22–5.81)
RBC: 4.89 MIL/uL (ref 4.22–5.81)
RBC: 5.05 MIL/uL (ref 4.22–5.81)
RBC: 5.09 MIL/uL (ref 4.22–5.81)
RDW: 15.4 % (ref 11.5–15.5)
RDW: 15.6 % — ABNORMAL HIGH (ref 11.5–15.5)
RDW: 16 % — ABNORMAL HIGH (ref 11.5–15.5)
WBC: 10.8 10*3/uL — ABNORMAL HIGH (ref 4.0–10.5)
WBC: 7.3 10*3/uL (ref 4.0–10.5)

## 2010-05-15 LAB — URINALYSIS, ROUTINE W REFLEX MICROSCOPIC
Bilirubin Urine: NEGATIVE
Glucose, UA: NEGATIVE mg/dL
Ketones, ur: NEGATIVE mg/dL
Nitrite: NEGATIVE
Specific Gravity, Urine: 1.031 — ABNORMAL HIGH (ref 1.005–1.030)
Urobilinogen, UA: 1 mg/dL (ref 0.0–1.0)
pH: 5 (ref 5.0–8.0)
pH: 5 (ref 5.0–8.0)

## 2010-05-15 LAB — CORTISOL: Cortisol, Plasma: 36.8 ug/dL

## 2010-05-15 LAB — GLUCOSE, CAPILLARY
Glucose-Capillary: 118 mg/dL — ABNORMAL HIGH (ref 70–99)
Glucose-Capillary: 140 mg/dL — ABNORMAL HIGH (ref 70–99)
Glucose-Capillary: 165 mg/dL — ABNORMAL HIGH (ref 70–99)
Glucose-Capillary: 165 mg/dL — ABNORMAL HIGH (ref 70–99)
Glucose-Capillary: 184 mg/dL — ABNORMAL HIGH (ref 70–99)
Glucose-Capillary: 185 mg/dL — ABNORMAL HIGH (ref 70–99)
Glucose-Capillary: 216 mg/dL — ABNORMAL HIGH (ref 70–99)
Glucose-Capillary: 254 mg/dL — ABNORMAL HIGH (ref 70–99)

## 2010-05-15 LAB — BASIC METABOLIC PANEL
BUN: 19 mg/dL (ref 6–23)
BUN: 19 mg/dL (ref 6–23)
CO2: 19 mEq/L (ref 19–32)
CO2: 24 mEq/L (ref 19–32)
CO2: 29 mEq/L (ref 19–32)
Calcium: 7.9 mg/dL — ABNORMAL LOW (ref 8.4–10.5)
Calcium: 8.2 mg/dL — ABNORMAL LOW (ref 8.4–10.5)
Calcium: 8.3 mg/dL — ABNORMAL LOW (ref 8.4–10.5)
Calcium: 8.8 mg/dL (ref 8.4–10.5)
Chloride: 103 mEq/L (ref 96–112)
Chloride: 108 mEq/L (ref 96–112)
Creatinine, Ser: 1.22 mg/dL (ref 0.4–1.5)
Creatinine, Ser: 1.51 mg/dL — ABNORMAL HIGH (ref 0.4–1.5)
GFR calc Af Amer: >60 mL/min (ref >60)
GFR calc Af Amer: >60 mL/min (ref >60)
GFR calc Af Amer: >60 mL/min (ref >60)
GFR calc non Af Amer: >60 mL/min (ref >60)
Glucose, Bld: 107 mg/dL — ABNORMAL HIGH (ref 70–99)
Glucose, Bld: 131 mg/dL — ABNORMAL HIGH (ref 70–99)
Glucose, Bld: 136 mg/dL — ABNORMAL HIGH (ref 70–99)
Potassium: 3.6 mEq/L (ref 3.5–5.1)
Potassium: 3.6 mEq/L (ref 3.5–5.1)
Sodium: 137 mEq/L (ref 135–145)
Sodium: 141 mEq/L (ref 135–145)

## 2010-05-15 LAB — RAPID URINE DRUG SCREEN, HOSP PERFORMED
Barbiturates: NOT DETECTED
Benzodiazepines: NOT DETECTED
Cocaine: NOT DETECTED
Opiates: NOT DETECTED

## 2010-05-15 LAB — CARDIAC PANEL(CRET KIN+CKTOT+MB+TROPI)
CK, MB: 2.8 ng/mL (ref 0.3–4.0)
CK, MB: 4.7 ng/mL — ABNORMAL HIGH (ref 0.3–4.0)
Total CK: 53 U/L (ref 7–232)
Troponin I: 0.09 ng/mL — ABNORMAL HIGH (ref 0.00–0.06)

## 2010-05-15 LAB — COMPREHENSIVE METABOLIC PANEL
Albumin: 3 g/dL — ABNORMAL LOW (ref 3.5–5.2)
BUN: 15 mg/dL (ref 6–23)
Calcium: 8.2 mg/dL — ABNORMAL LOW (ref 8.4–10.5)
Creatinine, Ser: 1.46 mg/dL (ref 0.4–1.5)
Glucose, Bld: 216 mg/dL — ABNORMAL HIGH (ref 70–99)
Total Protein: 6.1 g/dL (ref 6.0–8.3)

## 2010-05-15 LAB — BLOOD GAS, ARTERIAL
Acid-base deficit: 5.4 mmol/L — ABNORMAL HIGH (ref 0.0–2.0)
Bicarbonate: 18.7 mEq/L — ABNORMAL LOW (ref 20.0–24.0)
O2 Saturation: 97.9 %
Patient temperature: 98.6

## 2010-05-15 LAB — CK TOTAL AND CKMB (NOT AT ARMC)
CK, MB: 2.4 ng/mL (ref 0.3–4.0)
Relative Index: INVALID (ref 0.0–2.5)
Total CK: 58 U/L (ref 7–232)

## 2010-05-15 LAB — POCT I-STAT 3, ART BLOOD GAS (G3+)
Bicarbonate: 17.9 mEq/L — ABNORMAL LOW (ref 20.0–24.0)
Patient temperature: 96.1
pH, Arterial: 7.298 — ABNORMAL LOW (ref 7.350–7.450)

## 2010-05-15 LAB — CHLORIDE, URINE, RANDOM: Chloride Urine: 92 mEq/L

## 2010-05-15 LAB — LACTIC ACID, PLASMA: Lactic Acid, Venous: 5.7 mmol/L — ABNORMAL HIGH (ref 0.5–2.2)

## 2010-05-15 LAB — CARBOXYHEMOGLOBIN
O2 Saturation: 60 %
Total hemoglobin: 12.8 g/dL — ABNORMAL LOW (ref 13.5–18.0)

## 2010-05-15 LAB — HEMOCCULT GUIAC POC 1CARD (OFFICE): Fecal Occult Bld: NEGATIVE

## 2010-05-15 LAB — DIFFERENTIAL
Basophils Absolute: 0 10*3/uL (ref 0.0–0.1)
Eosinophils Relative: 1 % (ref 0–5)
Lymphocytes Relative: 16 % (ref 12–46)
Monocytes Absolute: 0.3 10*3/uL (ref 0.1–1.0)
Monocytes Relative: 4 % (ref 3–12)

## 2010-05-15 LAB — HEMOGLOBIN A1C: Mean Plasma Glucose: 214 mg/dL

## 2010-05-15 LAB — TROPONIN I: Troponin I: <0.01 ng/mL (ref 0.00–0.06)

## 2010-05-15 LAB — LEGIONELLA ANTIGEN, URINE

## 2010-05-15 LAB — TSH: TSH: 3.131 u[IU]/mL (ref 0.350–4.500)

## 2010-05-25 ENCOUNTER — Other Ambulatory Visit: Payer: Self-pay | Admitting: *Deleted

## 2010-05-25 MED ORDER — INSULIN ASPART 100 UNIT/ML ~~LOC~~ SOLN: 10.0000 [IU] | Freq: Three times a day (TID) | SUBCUTANEOUS | Status: DC

## 2010-06-01 ENCOUNTER — Telehealth: Payer: Self-pay | Admitting: Pharmacist

## 2010-06-01 NOTE — Telephone Encounter (Signed)
Pt returning call after message left to make follow up appt with [facility].  Pt has not been seen since November 2011.  He no showed his appt in December.  A letter was mailed to his home in February after he was unable to be reached for f/u appt.  He states we called an cancelled his last appt but there is no documentation or evidence of that in the system.  We did deny a refill request for pt in February at the time the letter was sent.  Pt has not been taking Coumadin since that time.   Will discuss with Dr. Lovena Le if pt should resume Coumadin.

## 2010-06-12 NOTE — Consult Note (Signed)
Corey Palmer, Corey Palmer NO.:  [id number]   MEDICAL RECORD NO.:  [id number]          PATIENT TYPE:  INP   LOCATION:  2112                         FACILITY:  [facility]   PHYSICIAN:  Champ Mungo. Lovena Le, MD    DATE OF BIRTH:  1970/03/14   DATE OF CONSULTATION:  03/08/2008  DATE OF DISCHARGE:                                 CONSULTATION   REQUESTING PHYSICIAN:  Teaching Service.   INDICATION FOR CONSULTATION:  Evaluation of ICD discharge.   HISTORY OF PRESENT ILLNESS:  The patient is a 40 year old man who has  been ill for approximately 2 weeks with diarrhea, nausea and vomiting.  He was awakened this morning at 4 o'clock complaining of sensation of a  defibrillator shock.  He was brought to the [facility] where he  was found to be profoundly hypotensive with systolic blood pressures in  the 60s despite normal saline.  The patient was admitted and after  undergoing blood cultures and initiation of intravenous antibiotics and  dopamine as per the shock protocol.  The patient really does not have  much in the way of complaints other than those previously described.  He  denies any skin problems or changes.  He denies any worsening heart  failure symptoms at present.   PAST MEDICAL HISTORY:  Notable for a long-standing dilated  cardiomyopathy.  The patient underwent initial ICD implantation in 2008  subsequently developed T-wave oversensing and unnecessary ICD discharges  and after discussion of the options underwent ICD lead extraction.  The  procedure was complicated by tearing of the lead at the SVC right atrial  junction resulting in pericardial tamponade requiring emergent  thoracotomy.  Despite this, he thrived.  He ultimately underwent  reimplantation of his defibrillator in July 2009 and had been stable  until his symptoms began several weeks ago.   ADDITIONAL PAST MEDICAL HISTORY:  Notable for diabetes.   FAMILY HISTORY:  Notable for hypertension and  dyslipidemia.   SOCIAL HISTORY:  The patient is married and lives in [place].   REVIEW OF SYSTEMS:  As noted in the HPI.  Also notable for weakness.   PHYSICAL EXAMINATION:  VITAL SIGNS:  Temperature initially of 95 and now  97.  His pulse was 80 and regular, blood pressure was 80/44, the  respirations were 20, and oxygen saturation was 92%.  HEENT:  Normocephalic and atraumatic.  Pupils were equal and round.  The  oropharynx was moist.  The sclerae were anicteric.  NECK:  No jugular  venous distention.  There are no thyromegaly.  The trachea is midline.  Carotids are 2+ and symmetric.  LUNGS:  Clear bilaterally to auscultation.  No wheezes, rales or rhonchi  are present..  There is no increased work of breathing.  CARDIOVASCULAR:  Distant with a regular rate and rhythm.  I did not appreciate any  murmurs.  There was no S3 gallop appreciated.  The PMI was enlarged and  laterally displaced.  ABDOMEN:  Soft, nontender, and there is no organomegaly.  The bowel  sounds are present.  There is no rebound  or guarding.  EXTREMITIES:  No  cyanosis, clubbing or edema.  The pulses are 2+ and symmetric.  NEUROLOGIC:  The patient is sleepy and somnolent.  He answers very brief  questions with yes and no answers.   LABORATORY DATA:  Demonstrated a white count of 8000, a creatinine of  1.4, a hemoglobin of 13.8.  The glucose was 254.  Initial troponins were  negative.  EKG demonstrated normal sinus rhythm.   Interrogation of his defibrillator demonstrates no intercurrent ICD  therapies.   IMPRESSION:  1. A phantom implantable cardioverter-defibrillator shock with no      evidence of any defibrillator therapies by device interrogation of      this Medtronic single-chamber defibrillator.  2. Nonischemic cardiomyopathy, ejection fraction of 20%.  3. Sepsis like syndrome with hypotension, nausea, vomiting and      questionable diarrhea with blood cultures, dopamine and antibiotics      that have  been started and the patient is already improved.  4. Implantable cardioverter-defibrillator generator removal secondary      to T-wave oversensing resulting in lead extraction complicated by      pericardial effusion requiring new device implant back in July      1999.  5. Diabetes.  6. History of dyslipidemia.   DISCUSSION:  The patient remains critically ill and has undergone sepsis  protocol initiation.  One issue is whether or not the patient may well  have underlying bacteremia and what the source might be.  His urinalysis  has been said to be abnormal and this may be the source.  However, in  the back of our minds, we will be on the lookout for a bloodstream  infection secondary to his defibrillator.  At the present time, there is  no embolic phenomena and no other stigmata of bacterial endocarditis.  I  will have to be very watchful of these.  I plan to follow the patient  with you.      Champ Mungo. Lovena Le, MD  Electronically Signed     GWT/MEDQ  D:  03/08/2008  T:  03/09/2008  Job:  [id number]

## 2010-06-12 NOTE — Assessment & Plan Note (Signed)
[facility]                         ELECTROPHYSIOLOGY OFFICE NOTE   NAME:Joiner, LEONID BAISE                        MRN:          [id number]  DATE:03/23/2008                            DOB:          02/11/1970    Mr. Steenhoek returns today for followup.  He is a very pleasant young male  with a nonischemic cardiomyopathy and congestive heart failure.  The  patient was hospitalized several weeks ago after presenting with fevers  and chills, though his cultures were sterile and ultimately there was no  obvious infection demonstrated.  He gradually improved.  The etiology of  his symptoms is unclear.  He has been stable since then with no specific  complaints.  His wife who is with him today states that he has not been  particularly active, but tends to be fairly sedentary.   MEDICATIONS:  1. Digoxin 0.25 a day.  2. Lasix 40 a day.  3. Lisinopril 10 a day.  4. Colchicine 0.6 a day.  5. Amlodipine 5 mg daily.  6. Coreg 12.5 twice daily.  7. Glipizide 10 a day.   PHYSICAL EXAMINATION:  GENERAL:  He is a pleasant well-appearing young  man in no acute distress.  VITAL SIGNS:  Blood pressure was 110/80, the pulse 100 and regular,  respirations were 18.  NECK:  No jugular venous distention.  LUNGS:  Clear bilaterally to auscultation.  No wheezes, rales, or  rhonchi are present.  There is no increased work of breathing.  CARDIOVASCULAR:  Regular rate and rhythm.  Normal S1 and S2.   The EKG demonstrates sinus rhythm with biatrial enlargement, nonspecific  ST-T wave changes.  Interrogation of his defibrillator demonstrates no  intercurrent ICD therapies.  He was 100% V-sensed.  His activity was  decreased, but is gradually improving from his hospitalization.  His  OptiVol is flat.   IMPRESSION:  1. Nonischemic cardiomyopathy.  2. Congestive heart failure.  3. Status post implantable cardioverter-defibrillator insertion.   DISCUSSION:  Overall, Mr. Pruet is  stable and his defibrillator is  working normally.  There is no evidence of any recurrent infection.  We  will plan to see the patient back in several months.    Champ Mungo. Lovena Le, MD  Electronically Signed   GWT/MedQ  DD: 03/24/2008  DT: 03/25/2008  Job #: [id number]

## 2010-06-12 NOTE — Discharge Summary (Signed)
Corey Palmer, Corey Palmer                 ACCOUNT NO.:  [id number]   MEDICAL RECORD NO.:  [id number]          PATIENT TYPE:  INP   LOCATION:  3706                         FACILITY:  [facility]   PHYSICIAN:  C. Milta Deiters, M.D.DATE OF BIRTH:  April 10, 1970   DATE OF ADMISSION:  03/08/2008  DATE OF DISCHARGE:                               DISCHARGE SUMMARY   DISCHARGE DIAGNOSES:  1. Cardiogenic shock.  2. Hypertension.  3. Nonischemic cardiomyopathy with ejection fraction of 10-15% status      post implantable cardioverter-defibrillator implant in February      2008.  4. Diabetes.  5. Gout.   DISCHARGE MEDICATIONS:  1. Digoxin 0.25 mg p.o. daily.  2. Lasix 40 mg p.o. daily.  3. Lisinopril 10 mg p.o. daily.  4. Colchicine 0.6 mg p.o. daily.  5. Amlodipine 5 mg p.o. daily.  6. Metformin 500 mg p.o. b.i.d.  7. Coreg 6.25 mg p.o. b.i.d.  8. Glipizide 10mg  p.o. daily.   DISPOSITION AND FOLLOW UP:  The patient will follow up with Dr. Lovena Le  at [facility] on March 23, 2008 at 3:00 p.m.  At that time,  he is going to need a digoxin level drawn.  Please note that the patient  is being discharged on reduced dosages of his home medications in light  of his recent cardiogenic shock.  Please feel free to titrate up on his  dose of beta-blocker, ACE inhibitor and diuretics.  The patient will  also follow up with Dr. Caryn Section at the [facility] on  March 18, 2008 at 2:50 in the afternoon.   PROCEDURE PERFORMED:  1. Chest x-ray on March 08, 2008 shows cardiac enlargement stable,      vascular congestion without overt pulmonary edema and bibasilar      atelectasis and vascular crowding.  2. Chest x-ray on March 08, 2008 shows central catheter in place at      the mid to lower SVC.  3. CT angiogram of the chest on March 09, 2008 shows no evidence of      deep pulmonary embolus, mild ground glass opacity within the right      upper lobe representing mild pulmonary  edema versus early      infection.  4. Echocardiogram on March 08, 2008 revealed the left ventricle was      mildly to moderately dilated. Overall left ventricular systolic      function was severely reduced. Left    ventricular ejection fraction was estimated , range being 10% to 15 %.  There was severe diffuse left ventricular hypokinesis.   CONSULTATIONS:  1. Dr. Crissie Sickles with Cardiology  2. Dr. Nelda Marseille with [place].   ADMITTING HISTORY AND PHYSICAL:  The patient is a 40 year old Caucasian  male with history of nonischemic cardiomyopathy status post AICD,  hypertension and hyperlipidemia who presents to the [facility] ED with  complaint of chest pain and shortness of breath.  The patient reports  approximately 2 weeks of nausea and diarrhea prior to admission,  however, he states, on the morning of 4 a.m.,  he awoke with such severe  chest pain and he thought that he had been shocked by his defibrillator.  The patient says he has been shocked prior and he said this chest pain  was very similar.  Throughout the rest of the morning prior to coming to  the hospital, he began to develop increase in left-sided chest pain that  was nonradiating.  He denied any diaphoresis but did state he had  increased shortness of breath and orthopnea.   PHYSICAL EXAMINATION:  VITAL SIGNS:  Temperature 97.6, blood pressure  80/44, pulse 82, respirations 20, the patient is saturating 92% on 2 L.  GENERAL:  The patient is a 40 year old male in no acute distress.  EYES:  Pupils equal, round, and reactive to light and accommodation.  Extraocular movements intact.  Anicteric.  EARS, NOSE AND THROAT:  Moist mucous membranes, atraumatic.  NECK:  Jugular venous distention up to the jaw.  Neck is supple.  RESPIRATION:  Decreased air movement in the right base, crackles, no  wheezing appreciated.  CARDIOVASCULAR:  Regular rate and rhythm.  No murmurs, rubs or gallops.  GI:  Positive bowel  sounds.  ABDOMEN:  Soft with some mild epigastric tenderness to palpation.  No  guarding.  No rebound tenderness.  EXTREMITIES:  1+ edema bilaterally of the lower extremities.  NEURO:  The patient is agitated but moving all four extremities  spontaneously.  He is alert and oriented.  Cranial nerve II through XII  intact.  PSYCH:  The patient is agitated.   LABORATORY DATA:  Sodium 137, potassium 4.7, chloride 95, bicarb 20, BUN  16, creatinine 1.4, glucose 226.  White count 8.0, hemoglobin 13.8,  platelets 305.  BNP 46, lipase of 19, lactic acid 5.7, D-dimer 2.7.  Urine drug screen is negative.  First set of biomarkers is negative.   HOSPITAL COURSE:  1. Cardiogenic shock.  The patient was admitted through [facility] in order to be evaluated for his acute episode of      chest pain and hypotension. Before being admitted to the ICU, the      patient received approximately three 1 L bolus of normal saline in      the emergency department.  In addition, he was placed on broad-      spectrum antibiotics that included vancomycin, Primaxin and      azithromycin in order to cover him for any septic type complaints.      Shortly after being admitted to the Intensive Care Unit, Critical      Care Medicine was consulted and they subsequently put the patient      on dopamine drip with average mean ventricular pressure greater      than 65.  The patient was soon transitioned to dobutamine after a      bedside echocardiogram displayed a severely reduced left      ventricular function with an EF of approximately 10-15% with severe      diffuse left ventricular hypokinesis.  After being transitioned to      the dobutamine, the patient's heart rate responded appropriately.      The patient also put on nasal cannula approximately 6 L in order to      keep oxygen saturations above 92%.  In light of his elevated D-      dimer, the primary team initially ordered lower extremity  Dopplers      but in the interim placed the  patient on treatment dose heparin to      treat empirically for  pulmonary embolus.  No CT angiogram was      obtained initially because the patient's creatinine was 1.4 in the      setting of a severe dehydration and to avoid further damage to the      kidneys.  Overnight, the patient responded well to the fluids and on the second  day of hospitalization, his dobutamine was weaned off, also with IV  hydration his creatinine corrected and a CT angiogram was ordered, which  displayed no evidence of acute pulmonary embolus.  His heparin was then  changed from treatment dose to prophylactic dose.  After speaking with  the patient further, it was believed that his shock was due primarily to  approximately 2 weeks with history of viral gastroenteritis which led to  severe volume contraction and the patient stated that he continued to  take all of his home medications including Lasix 40 b.i.d., Coreg,  lisinopril and amlodipine in light of all this volume loss.  With hydration, the patient's borderline acidotic state resolved and in  addition, blood cultures and urine cultures were unremarkable.  Infection was low on the differential.  His antibiotics were  discontinued on the day prior to discharge.  Also, the patient was  restarted on lower doses of his home medications prior to discharge.  He  will be discharged on much reduced doses of home medications in light of  his episode of cardiogenic shocks due to dehydration at the time of  followup and Dr. Lovena Le can feel free to titrate his dosages as the  patient will be able to tolerate.  Also of mention, the EP Division of  Cardiology came by and interrogated the patient's defibrillator on his  first day of hospitalization and they determined that the patient had in  fact not been shocked by his AICD.  1. Type 2 diabetes.  The patient is a non-insulin-dependent diabetic      who takes metformin 500  mg b.i.d. at home.  Upon admission to the      intensive care unit, the ICU hyperglycemia protocol was inactive      and the patient maintained good glucose control throughout his stay      in the ICU.  After the patient was transitioned to a telemetry      floor, he was transitioned to sensitive sliding scale regimen of      insulin. He will be able to restart his metformin upon being      discharged from the hospital.  He will need close followup in the      [facility] in order to evaluate his A1c and treat      his diabetes most appropriately.   DISCHARGE VITAL SIGNS:  Temp 96.6, BP 140/95, Pulse 92, Resp 20, O2 Sat  94% on room air.   DISCHARGE LABS:  Sodium 141, Potassium 3.6, Chloride 103, CO2 29, BUN 7,  Cr 0.72, Glucose 131.   The patient was discharged home in stable and improved condition.      Kathlynn Grate, MD  Electronically Signed      C. Milta Deiters, M.D.  Electronically Signed    ZF/MEDQ  D:  03/10/2008  T:  03/11/2008  Job:  [id number]   cc:   Minus Breeding, MD, [facility]  Junius Finner, MD

## 2010-06-13 ENCOUNTER — Encounter: Payer: Self-pay | Admitting: Internal Medicine

## 2010-06-15 NOTE — Discharge Summary (Signed)
[facility]  Patient:    Corey Palmer, Corey Palmer                          MRN: [id number] Adm. Date:  [id number] Disc. Date: 08/19/00 Attending:  Camelia Phenes Dictator:   [facility] [place], [place].A.                           Discharge Summary  ADMISSION DIAGNOSES: 1. Chest pain, questionable etiology. 2. Hypertension. 3. Hyperlipidemia. 4. Premature family history of cardiovascular disease. 5. Asthma. 6. History of total knee replacement on the left with reconstructive surgery    secondary to severe trauma to the left leg.  DISCHARGE DIAGNOSES: 1. Chest pain, questionable etiology. 2. Hypertension. 3. Hyperlipidemia. 4. Premature family history of cardiovascular disease. 5. Asthma. 6. History of total knee replacement on the left with reconstructive surgery    secondary to severe trauma to the left leg. 7. Status post cardiac catheterization on August 19, 2000, which revealed 40%    left anterior descending disease and calcification in the left anterior    descending.  There was no renal artery stenosis.  Left ventriculogram    showed ejection fraction 30-35% with global hypokinesis.  HISTORY OF PRESENT ILLNESS:  The patient is a 40 year old white married male with a history of hypertension, hyperlipidemia, premature family history of CAD.  He presented to the [facility] ER on August 17, 2000, with complaints of chest pain.  He described it as a 9-1/2 over 10.  He had never had this before.  He was at the [place] with his three children and wife when he started having severe chest pain that he described as a stretching beating on his chest, as if an elephant had pressure on his chest.  It radiated to his right arm with tingling and numbness in the left neck and tingling associated with shortness of breath and diaphoresis.  He was given three sublingual nitroglycerin in the emergency room which reduced his pain from an 8 to a 1. He was given aspirin  and another sublingual nitroglycerin and he was started on IV nitroglycerin, IV heparin.  EKG showed nonspecific ST-T abnormality and he was then without pain.  He had taken some diet pills approximately three months ago, but none since.  He does lift weights, but none in the last four days.  He had no recent cough, cold or over-the-counter medicines.  Physical exam at that time, his heart rate was 82, blood pressure 146/90, and his exam remained no significant findings.  At that time, it was felt that he may be experiencing unstable angina.  He is planned for admission to check serial enzymes, rule out MI.  He was placed on aspirin, plavix, heparin, nitroglycerin, beta blocker and plan for cardiac cath the upcoming Monday.  HOSPITAL COURSE:  On August 18, 2000, he had one episode of chest discomfort during the previous night and the morning also, but was pain-free at the time of our interview.  He was continued on medication and planned for cath.  His first two sets of enzymes were negative.  Because of scheduling, his cardiac catheterization was not performed until August 19, 2000.  On August 19, 2000, he underwent cardiac catheterization by Dr. Alla German. Please see his dictated cath report for further detail, as it is unavailable at this time.  He was found to have a 40% stenosis  in the mid LAD with calcification there.  Remainder of his coronaries were normal with normal left circumflex, left main and RCA.  LV gram shows EF 30-35% with global hypokinesis.  Aortogram shows no renal artery stenosis.  Perclose was used for closure and he was given 1 g IV antibiotic.  Dr. Tami Ribas planned for aggressive hypertension management and began ACE inhibitor for depressed LV function.  He planned to obtain an outpatient exercise Cardiolite to assess for LAD ischemia.  Later that afternoon, the patient was seen and was stable post-cath.  His groin was stable with hematoma or bleed and is  hemodynamically stable.  He ambulated without difficulty.  HOSPITAL CONSULTS:  None.  HOSPITAL PROCEDURES:  Cardiac catheterization on August 19, 2000, by Dr. Alla German, which revealed 40% mid-LAD disease with calcification.  The remainder of his coronary arteries were normal with normal left main, normal left circumflex, and normal RCA.  LV gram shows EF 30-35% with global hypokinesis. Aortogram shows no renal artery stenosis.  LABORATORIES:  On August 17, 2000, sodium 140, potassium 3.9, chloride 106, CO2 27, BUN 11, creatinine 0.8, LFTs normal with SGOT 24, SGPT 27, alkaline phosphatase 80, total bili 0.5.  CBC on August 17, 2000, shows WBC 10.2, hemoglobin 14.2, hematocrit 40.3 and platelets 336.  Differential is normal. Cardiac enzymes negative with CKs of 91, 58, and 55.  MB 2.1, 1.0, 1.1, troponin 0.02, 0.01, and 0.02.  EKG shows normal sinus rhythm with nonspecific ST-T change.  DISCHARGE MEDICATIONS: 1. Enteric-coated aspirin 325 mg 1 p.o. q.d. 2. Altace 5 mg twice a day. 3. Toprol XL 50 mg once a day. 4. Nitroglycerin 0.4 mg sublingual as directed. 5. Hydrochlorothiazide/triamterene as before. 6. Verapamil 240 mg twice a day. 7. Lipitor 20 mg once a day.  ACTIVITY:  No strenuous activity, lifting greater than 5 pounds, driving or sexual activity for three days.  DIET:  Low-salt, low-fat, low-cholesterol diet.  DISCHARGE INSTRUCTIONS: 1. May gently wash groin with warm water and soap. 2. Call the office at [phone] if any increased size or pain of the groin or    any bleeding. 3. He is to follow up with an exercise Cardiolite on Monday, August 5 at 9:15    a.m. on the second floor of our office.  He was instructed in the following: 1. Do not take Toprol the day before of day of the test. 2. Nothing to eat after midnight, no caffeine or decaff sodas or coffee the    day of the test. 3. He is to follow up with Dr. Melvern Banker on August 12 at 11:45 a.m.  As well, he was  reviewed the Perclose closure information, including no  sitting in a tub of water or no swimming in a pool for one week. DD:  08/19/00 TD:  08/20/00 Job: [id number]

## 2010-06-15 NOTE — Cardiovascular Report (Signed)
[facility]  Patient:    Corey Palmer, Corey Palmer                          MRN: [id number] Proc. Date: 08/19/00 Adm. Date:  [id number] Attending:  Camelia Phenes CC:         Bryson Dames, M.D.   Cardiac Catheterization  PROCEDURES: 1. Left heart catheterization. 2. Coronary angiography. 3. Left ventriculogram. 4. Abdominal aortogram.  ATTENDING:  Alla German, M.D.  COMPLICATIONS:  None.  INDICATIONS:  [name] is a 39 year old male with no prior cardiac history, who presented to the ER on August 17, 2000, complaining of substernal chest pain with shortness of breath and diaphoresis.  He was subsequently admitted on IV nitroglycerin and heparin.  He ruled out for myocardial infarction.  The patient does have a positive family history of CAD.  He continued to have chest pain while hospitalized and is now brought to the cardiac catheterization laboratory to define his coronary artery status.  DESCRIPTION OF PROCEDURE:  After giving informed written consent, the patient was brought to the cardiac catheterization laboratory where the right and left groins were shaved, prepped, and draped in the usual sterile fashion.  ECG monitoring was established.  Using the modified Seldinger technique, a 6-French arterial sheath was inserted in the right femoral artery.  A 6-French diagnostic catheter was then used to performed diagnostic angiography.  This revealed a large left main with no significant disease.  The LAD is a large vessel which coursed to the apex and gave rise to two diagonal branches.  The LAD is noted to have mild 40% stenosis in the early mid portion of the LAD, with mild calcification.  The first diagonal is a large vessel with no significant disease.  The second diagonal is a medium sized vessel with no significant disease.  The left circumflex is a medium sized vessel which coursed in the AV groove and gave rise to one large  bifurcating obtuse marginal.  There is no significant disease in the AV groove circumflex or OM.  The right coronary artery is a large vessel which is dominant and gives rise to both a PDA, as well as a posterolateral branch.  There is no significant disease in the RCA, PDA, or posterolateral branch.  Left ventriculogram reveals moderately depressed EF of 30-35%, with global hypokinesis.  Abdominal aortogram reveals no evidence of renal artery stenosis.  HEMODYNAMICS:  Systemic arterial pressure 147/106, LV systemic pressure 147/18, LV EDP of 25.  Following the case, the right femoral site was closed using a Perclose device without complications.  One gram of vancomycin was given prophylactically.  CONCLUSIONS: 1. No significant coronary artery disease. 2. Moderately depressed left ventricular systolic function. 3. No infrarenal artery stenosis. 4. Systemic hypertension. DD:  08/19/00 TD:  08/19/00 Job: [id number]

## 2010-06-26 NOTE — Telephone Encounter (Signed)
Would not continue Coumadin if patient going to be noncompliant.

## 2010-08-03 ENCOUNTER — Ambulatory Visit (INDEPENDENT_AMBULATORY_CARE_PROVIDER_SITE_OTHER): Payer: Medicaid Other | Admitting: Internal Medicine

## 2010-08-03 ENCOUNTER — Encounter: Payer: Self-pay | Admitting: Internal Medicine

## 2010-08-03 DIAGNOSIS — R7309 Other abnormal glucose: Secondary | ICD-10-CM

## 2010-08-03 DIAGNOSIS — O99810 Abnormal glucose complicating pregnancy: Secondary | ICD-10-CM

## 2010-08-03 DIAGNOSIS — I428 Other cardiomyopathies: Secondary | ICD-10-CM

## 2010-08-03 DIAGNOSIS — I509 Heart failure, unspecified: Secondary | ICD-10-CM

## 2010-08-03 DIAGNOSIS — E781 Pure hyperglyceridemia: Secondary | ICD-10-CM

## 2010-08-03 DIAGNOSIS — I10 Essential (primary) hypertension: Secondary | ICD-10-CM

## 2010-08-03 DIAGNOSIS — I4891 Unspecified atrial fibrillation: Secondary | ICD-10-CM

## 2010-08-03 DIAGNOSIS — E119 Type 2 diabetes mellitus without complications: Secondary | ICD-10-CM

## 2010-08-03 DIAGNOSIS — E785 Hyperlipidemia, unspecified: Secondary | ICD-10-CM

## 2010-08-03 DIAGNOSIS — M109 Gout, unspecified: Secondary | ICD-10-CM

## 2010-08-03 LAB — CBC WITH DIFFERENTIAL/PLATELET
Basophils Absolute: 0 10*3/uL (ref 0.0–0.1)
Basophils Relative: 0 % (ref 0–1)
Hemoglobin: 17.5 g/dL — ABNORMAL HIGH (ref 13.0–17.0)
Lymphocytes Relative: 28 % (ref 12–46)
MCHC: 35.3 g/dL (ref 30.0–36.0)
Monocytes Relative: 8 % (ref 3–12)
Neutro Abs: 4.9 10*3/uL (ref 1.7–7.7)
Neutrophils Relative %: 62 % (ref 43–77)
RDW: 12.7 % (ref 11.5–15.5)
WBC: 7.8 10*3/uL (ref 4.0–10.5)

## 2010-08-03 LAB — LIPID PANEL: Triglycerides: 516 mg/dL — ABNORMAL HIGH (ref <150)

## 2010-08-03 LAB — GLUCOSE, CAPILLARY: Glucose-Capillary: 141 mg/dL — ABNORMAL HIGH (ref 70–99)

## 2010-08-03 LAB — POCT GLYCOSYLATED HEMOGLOBIN (HGB A1C): Hemoglobin A1C: 12.9

## 2010-08-03 LAB — POCT INR: INR: 1

## 2010-08-03 MED ORDER — LISINOPRIL 10 MG PO TABS: 10.0000 mg | ORAL_TABLET | Freq: Every day | ORAL | Status: DC

## 2010-08-03 MED ORDER — WARFARIN SODIUM 10 MG PO TABS: ORAL_TABLET | ORAL | Status: DC

## 2010-08-03 MED ORDER — GLUCOSE BLOOD VI STRP: ORAL_STRIP | Status: DC

## 2010-08-03 MED ORDER — INSULIN GLARGINE 100 UNIT/ML ~~LOC~~ SOLN: 25.0000 [IU] | Freq: Every day | SUBCUTANEOUS | Status: DC

## 2010-08-03 MED ORDER — ACCU-CHEK FASTCLIX LANCETS MISC: 1.0000 | Freq: Three times a day (TID) | Status: DC

## 2010-08-03 MED ORDER — INSULIN PEN NEEDLE 31G X 8 MM MISC: Status: DC

## 2010-08-03 MED ORDER — INSULIN ASPART 100 UNIT/ML ~~LOC~~ SOLN: 5.0000 [IU] | Freq: Three times a day (TID) | SUBCUTANEOUS | Status: DC

## 2010-08-03 MED ORDER — DIGOXIN 250 MCG PO TABS: 250.0000 ug | ORAL_TABLET | Freq: Every day | ORAL | Status: DC

## 2010-08-03 NOTE — Assessment & Plan Note (Signed)
Patient does not have any joint pain currently. Colchicine and Mobic are held until lab is done.

## 2010-08-03 NOTE — Patient Instructions (Addendum)
1. Take medications as prescribed; contact your cardiologist for Tikosyn refill. 2. Take Warfarin (Coumadin) one half of 10 mg tablet daily until you see Dr. Elie Confer on Monday in the anticoagulation clinic.  3. Echocardiogram will be scheduled. 4. Check your blood sugar before meals and at bed time.

## 2010-08-03 NOTE — Assessment & Plan Note (Addendum)
Patient's heart rate is well maintained (76/min). Heart rhythm is regular. He will be on Digoxin and instructed to see his cardiologist for the refill of Tikosyn which he had been taking until 2 months ago. INR done at clinic today is 1.0. Patient is to restart 5 mg Coumadin daily and instructed to see Dr. Elie Confer on July 9th, 2012.

## 2010-08-03 NOTE — Progress Notes (Signed)
  Subjective:    Patient ID: Corey Palmer, male    DOB: [birth year], 40 y.o.   MRN: [id number]  HPI Patient is 40 yo gentle man with PMH of A. Fib, AODM, metabolic acidosis, hypertension, hyperlipidemia, cardiomyopathy, AICD, CHF and gout presents for a follow-up visit. Patient reports that he has been off his medications for 2 months due to the financial difficulty for paying the medications. He does not have any new complaints, and denies chest pain, palpitation, SOB or any fluid retention on his ankles. He wants to get all his previous medications refilled.    Review of Systems  Constitutional: Negative.   HENT: Negative.   Eyes: Negative.   Respiratory: Negative.   Cardiovascular: Negative.   Gastrointestinal: Negative.   Musculoskeletal: Negative.   Skin: Negative.   Neurological: Negative.        Objective:   Physical Exam   General: alert, well-developed, and cooperative to examination.  Eyes: vision grossly intact, pupils equal, pupils round, pupils reactive to light, no injection and anicteric.  Mouth: pharynx pink and moist, no erythema, and no exudates.  Neck: supple, full ROM, no thyromegaly, no JVD, and no carotid bruits.  Lungs: normal respiratory effort, no accessory muscle use, normal breath sounds, no crackles, and no wheezes. Heart: normal rate, regular rhythm, no murmur, no gallop, and no rub.  Abdomen: soft, non-tender, normal bowel sounds, no distention, no guarding, no rebound tenderness, no hepatomegaly, and no splenomegaly.  Msk: no joint swelling, no joint warmth, and no redness over joints.  Pulses: 2+ DP/PT pulses bilaterally Extremities: No cyanosis, clubbing, edema, no fluid retention in his ankles Skin: turgor normal and no rashes.  Psych: Oriented X3, normally interactive, good eye contact, not anxious appearing, and not depressed appearing.     Assessment & Plan:

## 2010-08-03 NOTE — Assessment & Plan Note (Signed)
Patient does not have any symptoms. He is to restart 0.25 mcg oral Digoxin daily and 10 mg Lisinopril daily. Because he does not have any fluid retention, Lasix is held for now. Taking his low EF (15% by TEE on Aug. 1, 2011) into consideration, Metoprolol is held to see his response to the treatment. 2D Echocardiogram is ordered to assess his cardiac function.

## 2010-08-03 NOTE — Assessment & Plan Note (Signed)
Patient has not taken any medications for dyslipidemia since Sept, 2011. His last lipid profile done on Sept 23, 2011 showed that his LDL, HDL and triglycerides were 139, 22 and 944, respectively. Direct LDL and lipid panel are ordered for the future plan.

## 2010-08-03 NOTE — Assessment & Plan Note (Signed)
Patient's blood pressure is 138/86 mmHg. He is to restart 10 mg lisinopril daily. Metoprolol is held to see how his blood pressure changes.

## 2010-08-03 NOTE — Assessment & Plan Note (Signed)
Patient's HbA1c is 12.9 today. Because of history of metabolic acidosis and CHF, it is not suitable to put him on oral metformin. He is to restart 25 units of Lentus daily and 5 units of Novolog with meal. Patient is instructed to check his blood glucose level before meals and at the bed time. CEMT is ordered today.

## 2010-08-03 NOTE — Progress Notes (Deleted)
  Subjective:    Patient ID: Corey Palmer, male    DOB: 1970-10-28, 40 y.o.   MRN: [id number]  HPI    Review of Systems     Objective:   Physical Exam        Assessment & Plan:

## 2010-08-04 LAB — COMPLETE METABOLIC PANEL WITH GFR
ALT: 66 U/L — ABNORMAL HIGH (ref 0–53)
AST: 44 U/L — ABNORMAL HIGH (ref 0–37)
CO2: 26 mEq/L (ref 19–32)
Calcium: 9.2 mg/dL (ref 8.4–10.5)
Chloride: 103 mEq/L (ref 96–112)
Creat: 0.88 mg/dL (ref 0.50–1.35)
GFR, Est African American: >60 mL/min (ref >60)
Potassium: 4.3 mEq/L (ref 3.5–5.3)
Sodium: 140 mEq/L (ref 135–145)
Total Protein: 6.6 g/dL (ref 6.0–8.3)

## 2010-08-04 LAB — LDL CHOLESTEROL, DIRECT: Direct LDL: 131 mg/dL — ABNORMAL HIGH

## 2010-08-06 ENCOUNTER — Ambulatory Visit (INDEPENDENT_AMBULATORY_CARE_PROVIDER_SITE_OTHER): Payer: Medicaid Other | Admitting: Pharmacist

## 2010-08-06 DIAGNOSIS — I4891 Unspecified atrial fibrillation: Secondary | ICD-10-CM

## 2010-08-06 DIAGNOSIS — Z7901 Long term (current) use of anticoagulants: Secondary | ICD-10-CM

## 2010-08-06 NOTE — Progress Notes (Signed)
Addended by: Bertha Stakes on: 08/06/2010 02:46 PM   Modules accepted: Level of Service

## 2010-08-06 NOTE — Progress Notes (Signed)
I saw patient and discussed his care with resident Dr. Blaine Hamper.  I agree with the clinical findings and plans as outlined in his note.

## 2010-08-06 NOTE — Patient Instructions (Signed)
Patient instructed to take medications as defined in the Anti-coagulation Track section of this encounter.  Patient instructed to take today's dose.  Patient verbalized understanding of these instructions.    

## 2010-08-06 NOTE — Progress Notes (Signed)
Anti-Coagulation Progress Note  Corey Palmer is a 40 y.o. male who is currently on an anti-coagulation regimen.    RECENT RESULTS: Recent results are below, the most recent result is correlated with a dose of 5 mg. Per DAY since this past Friday. Lab Results  Component Value Date   INR 1.0 08/03/2010   INR 2.27* 12/16/2009   INR 2.67* 12/15/2009    ANTI-COAG DOSE:   Latest dosing instructions   Total Sun Mon Tue Wed Thu Fri Sat   30  10 mg 10 mg 10 mg        (10 mg1) (10 mg1) (10 mg1)            ANTICOAG SUMMARY: Anticoagulation Episode Summary              Current INR goal  Next INR check 08/09/2010   INR from last check 1.0 (08/03/2010)     Weekly max dose (mg)  Target end date    Indications ATRIAL FIBRILLATION, Long term current use of anticoagulant   INR check location  Preferred lab    Send INR reminders to [place]   Comments        Provider Role Specialty Phone number   Cristopher Peru, MD  Cardiology [phone]        ANTICOAG TODAY: Anticoagulation Summary as of 08/06/2010              INR goal      Selected INR 1.0 (08/03/2010) Next INR check 08/09/2010   Weekly max dose (mg)  Target end date    Indications ATRIAL FIBRILLATION, Long term current use of anticoagulant    Anticoagulation Episode Summary              INR check location  Preferred lab    Send INR reminders to [place]   Comments        Provider Role Specialty Phone number   Cristopher Peru, MD  Cardiology [phone]        PATIENT INSTRUCTIONS: Patient Instructions  Patient instructed to take medications as defined in the Anti-coagulation Track section of this encounter.  Patient instructed to take today's dose.  Patient verbalized understanding of these instructions.        FOLLOW-UP Return in 3 days (on 08/09/2010) for Follow up INR.  Jorene Guest, III Pharm.D., CACP

## 2010-08-09 ENCOUNTER — Ambulatory Visit: Payer: Medicaid Other

## 2010-08-14 ENCOUNTER — Encounter: Payer: Self-pay | Admitting: Internal Medicine

## 2010-08-14 NOTE — Progress Notes (Signed)
Patient's test results show that his AST and ALT are 44 and 66. His lipid panel result shows that Corey Palmer, TG and HDL are 258, 516 and 32, respectively. We plan to put him on Pravachol. Since patient just started all his medications last week. He needs to be closely monitored. Patient was called and convinced to come back on August 22, 2010.

## 2010-08-22 ENCOUNTER — Encounter: Payer: Medicaid Other | Admitting: Internal Medicine

## 2010-09-12 ENCOUNTER — Ambulatory Visit (HOSPITAL_COMMUNITY)
Admission: RE | Admit: 2010-09-12 | Discharge: 2010-09-12 | Disposition: A | Discharge status: Home / Self Care | Payer: Medicaid Other | Source: Ambulatory Visit | Attending: Internal Medicine | Admitting: Internal Medicine

## 2010-09-12 DIAGNOSIS — E119 Type 2 diabetes mellitus without complications: Secondary | ICD-10-CM | POA: Insufficient documentation

## 2010-09-12 DIAGNOSIS — Z6832 Body mass index (BMI) 32.0-32.9, adult: Secondary | ICD-10-CM | POA: Insufficient documentation

## 2010-09-12 DIAGNOSIS — I959 Hypotension, unspecified: Secondary | ICD-10-CM | POA: Insufficient documentation

## 2010-09-12 DIAGNOSIS — E669 Obesity, unspecified: Secondary | ICD-10-CM | POA: Insufficient documentation

## 2010-09-12 DIAGNOSIS — I428 Other cardiomyopathies: Secondary | ICD-10-CM | POA: Insufficient documentation

## 2010-09-12 DIAGNOSIS — I059 Rheumatic mitral valve disease, unspecified: Secondary | ICD-10-CM

## 2010-09-18 ENCOUNTER — Other Ambulatory Visit: Payer: Self-pay | Admitting: *Deleted

## 2010-09-18 ENCOUNTER — Other Ambulatory Visit: Payer: Self-pay | Admitting: Internal Medicine

## 2010-09-18 DIAGNOSIS — I509 Heart failure, unspecified: Secondary | ICD-10-CM

## 2010-09-18 DIAGNOSIS — I10 Essential (primary) hypertension: Secondary | ICD-10-CM

## 2010-09-18 MED ORDER — DIGOXIN 250 MCG PO TABS: 250.0000 ug | ORAL_TABLET | Freq: Every day | ORAL | Status: DC

## 2010-09-18 MED ORDER — INSULIN GLARGINE 100 UNIT/ML ~~LOC~~ SOLN: 25.0000 [IU] | Freq: Every day | SUBCUTANEOUS | Status: DC

## 2010-09-18 MED ORDER — LISINOPRIL 10 MG PO TABS: 10.0000 mg | ORAL_TABLET | Freq: Every day | ORAL | Status: DC

## 2010-09-19 NOTE — Telephone Encounter (Signed)
Rx faxed in.

## 2010-09-28 ENCOUNTER — Telehealth: Payer: Self-pay | Admitting: Internal Medicine

## 2010-09-28 ENCOUNTER — Telehealth: Payer: Self-pay | Admitting: *Deleted

## 2010-09-28 MED ORDER — FUROSEMIDE 20 MG PO TABS: 20.0000 mg | ORAL_TABLET | Freq: Two times a day (BID) | ORAL | Status: DC

## 2010-09-28 NOTE — Telephone Encounter (Signed)
I explained to patient that he needed to follow up with outpatient clinic for his results of echo which they ordered and also get his Warfain refilled  He says he is out

## 2010-09-28 NOTE — Telephone Encounter (Signed)
Lasix 40 mg. Twice a day. [facility] on [address].

## 2010-09-28 NOTE — Telephone Encounter (Signed)
Pt called asking for refill of Furosemide.  Note from July states that Dr. Blaine Hamper put this medication on hold for echo..  PT has had echo and states our office has not followed up on this.  He states that both of his legs and ankles are filled with fluid.  I explained that he needed to set up appt to follow up echo.  Pt stated that he would set up appointment when one of the nursing staff could call him back and advise him on what he actually is following up on.  Forward to Triage for review.  Doug Sou, CMA

## 2010-10-03 ENCOUNTER — Encounter: Payer: Self-pay | Admitting: *Deleted

## 2010-10-22 ENCOUNTER — Encounter: Payer: Medicaid Other | Admitting: Internal Medicine

## 2010-10-23 ENCOUNTER — Other Ambulatory Visit: Payer: Self-pay | Admitting: Internal Medicine

## 2010-10-23 NOTE — Telephone Encounter (Signed)
I am not going to refill this as pt is on Warfarin and has not F/U as indicated. Risk is too great without adequate F/U

## 2010-10-23 NOTE — Telephone Encounter (Signed)
Pt seen July and was to have F/U in one week due to multiple med changes. DNKA. Will ask front desk to sch appt in next 60 days.

## 2010-12-05 ENCOUNTER — Encounter: Payer: Medicaid Other | Admitting: Internal Medicine

## 2010-12-12 ENCOUNTER — Encounter: Payer: Medicaid Other | Admitting: Internal Medicine

## 2011-01-07 ENCOUNTER — Telehealth: Payer: Self-pay | Admitting: *Deleted

## 2011-01-07 ENCOUNTER — Ambulatory Visit (INDEPENDENT_AMBULATORY_CARE_PROVIDER_SITE_OTHER): Payer: Self-pay | Admitting: Pharmacist

## 2011-01-07 ENCOUNTER — Ambulatory Visit (INDEPENDENT_AMBULATORY_CARE_PROVIDER_SITE_OTHER): Payer: Self-pay | Admitting: Internal Medicine

## 2011-01-07 ENCOUNTER — Encounter: Payer: Self-pay | Admitting: Internal Medicine

## 2011-01-07 DIAGNOSIS — J069 Acute upper respiratory infection, unspecified: Secondary | ICD-10-CM

## 2011-01-07 DIAGNOSIS — I4891 Unspecified atrial fibrillation: Secondary | ICD-10-CM

## 2011-01-07 DIAGNOSIS — Z7901 Long term (current) use of anticoagulants: Secondary | ICD-10-CM

## 2011-01-07 LAB — POCT INR: INR: 1

## 2011-01-07 NOTE — Assessment & Plan Note (Signed)
Seems to be resolving, continue symptomatically treatment alone.

## 2011-01-07 NOTE — Telephone Encounter (Signed)
Pt called requesting an appointment for a cough and cold for several days. Will see today

## 2011-01-07 NOTE — Assessment & Plan Note (Signed)
Patient has a chads2 score of 3, has been off Coumadin for the past 3 months given that he was unable to come for an appointment, I have spoken to Dr. Elie Confer, and we have decided to restart the patient's Coumadin, will bring back the patient in one week for an INR check, for now advised the patient to take a full dose aspirin daily until INR is therapeutic

## 2011-01-07 NOTE — Patient Instructions (Signed)
Warfarin tablets What is this medicine? WARFARIN (WAR far in) is an anticoagulant. It is used to treat or prevent clots in the veins, arteries, lungs, or heart. This medicine may be used for other purposes; ask your health care provider or pharmacist if you have questions. What should I tell my health care provider before I take this medicine? They need to know if you have any of these conditions: -alcoholism -anemia -bleeding disorders -cancer -diabetes -heart disease -high blood pressure -history of bleeding in the gastrointestinal tract -history of stroke or other brain injury or disease -kidney or liver disease -protein C deficiency -protein S deficiency -psychosis or dementia -recent injury, recent or planned surgery or procedure -an unusual or allergic reaction to warfarin, other medicines, foods, dyes, or preservatives -pregnant or trying to get pregnant -breast-feeding How should I use this medicine? Take this medicine by mouth with a glass of water. Follow the directions on the prescription label. You can take this medicine with or without food. Take your medicine at the same time each day. Do not take it more often than directed. Do not stop taking except on the advice of your doctor or health care professional. A special MedGuide will be given to you by the pharmacist with each prescription and refill. Be sure to read this information carefully each time. Talk to your pediatrician regarding the use of this medicine in children. Special care may be needed. Overdosage: If you think you have taken too much of this medicine contact a poison control center or emergency room at once. NOTE: This medicine is only for you. Do not share this medicine with others. What if I miss a dose? It is important not to miss a dose. If you miss a dose, call your healthcare provider. Take the dose as soon as possible on the same day. If it is almost time for your next dose, take only that dose. Do  not take double or extra doses to make up for a missed dose. What may interact with this medicine? Do not take this medicine with any of the following medications: -agents that prevent or dissolve blood clots -aspirin or other salicylates -danshen -dextrothyroxine -mifepristone -St. John's Wort -red yeast rice This medicine may also interact with the following medications: -acetaminophen -agents that lower cholesterol -alcohol -allopurinol -amiodarone -antibiotics or medicines for treating bacterial, fungal or viral infections -azathioprine -barbiturate medicines for inducing sleep or treating seizures -certain medicines for diabetes -certain medicines for heart rhythm problems -certain medicines for high blood pressure -chloral hydrate -cisapride -disulfiram -male hormones, including contraceptive or birth control pills -general anesthetics -herbal or dietary products like cranberry, garlic, ginkgo, ginseng, green tea, or kava kava -influenza virus vaccine -male hormones -medicines for mental depression or psychosis -medicines for some types of cancer -medicines for stomach problems -methylphenidate -NSAIDs, medicines for pain and inflammation, like ibuprofen or naproxen -propoxyphene -quinidine, quinine -raloxifene -seizure or epilepsy medicine like carbamazepine, phenytoin, and valproic acid -steroids like cortisone and prednisone -tamoxifen -thyroid medicine -tramadol -vitamin c, vitamin e, and vitamin K -zafirlukast -zileuton This list may not describe all possible interactions. Give your health care provider a list of all the medicines, herbs, non-prescription drugs, or dietary supplements you use. Also tell them if you smoke, drink alcohol, or use illegal drugs. Some items may interact with your medicine. What should I watch for while using this medicine? Visit your doctor or health care professional for regular checks on your progress. You will need to have  your blood  checked regularly to make sure you are getting the right dose of this medicine. When you first start taking this medicine, these tests are done often. Once the correct dose is determined and you take your medicine properly, these tests can be done less often. While you are taking this medicine, carry an identification card with your name, the name and dose of medicine(s) being used, and the name and phone number of your doctor or health care professional or person to contact in an emergency. You should discuss your diet with your doctor or health care professional. Do not make major changes in your diet. Many foods contain high amounts of vitamin K, which can interfere with the effect of this medicine. Your doctor or health care professional may want you to limit your intake of foods that contain vitamin K. Some foods that have moderate to high amounts of vitamin K are green leafy vegetables like beet greens, collard greens, endive, kale, mustard greens, spinach, turnip greens, watercress, and certain lettuces like green leaf or romaine. Some other foods that have high to moderate amounts of vitamin K are asparagus, black eye peas, broccoli, brussel sprouts, cabbage, cucumber with peel, okra, peas, parsley, and green onions. This medicine can cause birth defects or bleeding in an unborn child. Women of childbearing age should use effective birth control while taking this medicine. If a woman becomes pregnant while taking this medicine, she should discuss the potential risks and her options with her health care professional. Avoid sports and activities that might cause injury while you are using this medicine. Severe falls or injuries can cause unseen bleeding. Be careful when using sharp tools or knives. Consider using an Copy. Take special care brushing or flossing your teeth. Report any injuries, bruising, or red spots on the skin to your doctor or health care professional. If you have an  illness that causes vomiting, diarrhea, or fever for more than a few days, contact your doctor. Also check with your doctor if you are unable to eat for several days. These problems can change the effect of this medicine. Even after you stop taking this medicine, it takes several days before your body recovers its normal ability to clot blood. Ask your doctor or health care professional how long you need to be careful. If you are going to have surgery or dental work, tell your doctor or health care professional that you have been taking this medicine. What side effects may I notice from receiving this medicine? Side effects that you should report to your doctor or health care professional as soon as possible: -back or stomach pain -chest pain or fast or irregular heartbeat -difficulty breathing or talking, wheezing -dizziness -fever or chills -headaches -heavy menstrual bleeding or vaginal bleeding -nausea, vomiting -painful, blue, or purple toes -painful, prolonged erection -signs and symptoms of bleeding such as bloody or black, tarry stools, red or dark-brown urine, spitting up blood or brown material that looks like coffee grounds, red spots on the skin, unusual bruising or bleeding from the eye, gums, or nose -skin rash, itching or skin damage -unusual swelling or sudden weight gain -unusually weak or tired -yellowing of skin or eyes Side effects that usually do not require medical attention (report to your doctor or health care professional if they continue or are bothersome): -diarrhea -unusual hair loss This list may not describe all possible side effects. Call your doctor for medical advice about side effects. You may report side effects to FDA at 1-800-FDA-1088.  Where should I keep my medicine? Keep out of the reach of children. Store at room temperature between 15 and 30 degrees C (59 and 86 degrees F). Protect from light. Throw away any unused medicine after the expiration  date. NOTE: This sheet is a summary. It may not cover all possible information. If you have questions about this medicine, talk to your doctor, pharmacist, or health care provider.  2012, [employer] Standard. (05/16/2010 10:58:04 AM)

## 2011-01-07 NOTE — Progress Notes (Signed)
Anti-Coagulation Progress Note  Corey Palmer is a 40 y.o. male who is currently on an anti-coagulation regimen.    RECENT RESULTS: Recent results are below, the most recent result is correlated with a dose of 0 mg. per week: Lab Results  Component Value Date   INR 1.0 01/07/2011   INR 1.0 08/03/2010   INR 2.27* 12/16/2009    ANTI-COAG DOSE:   Latest dosing instructions   Total Sun Mon Tue Wed Thu Fri Sat   50 5 mg 10 mg 10 mg 7.5 mg 7.5 mg 5 mg 5 mg    (5 mg1) (5 mg2) (5 mg2) (5 mg1.5) (5 mg1.5) (5 mg1) (5 mg1)         ANTICOAG SUMMARY: Anticoagulation Episode Summary              Current INR goal  Next INR check 01/14/2011   INR from last check 1.0 (01/07/2011)     Weekly max dose (mg)  Target end date    Indications ATRIAL FIBRILLATION, Long term current use of anticoagulant   INR check location  Preferred lab    Send INR reminders to [place]   Comments        Provider Role Specialty Phone number   Cristopher Peru, MD  Cardiology [phone]        ANTICOAG TODAY: Anticoagulation Summary as of 01/07/2011              INR goal      Selected INR 1.0 (01/07/2011) Next INR check 01/14/2011   Weekly max dose (mg)  Target end date    Indications ATRIAL FIBRILLATION, Long term current use of anticoagulant    Anticoagulation Episode Summary              INR check location  Preferred lab    Send INR reminders to [place]   Comments        Provider Role Specialty Phone number   Cristopher Peru, MD  Cardiology [phone]        PATIENT INSTRUCTIONS: Patient Instructions  Patient instructed to take medications as defined in the Anti-coagulation Track section of this encounter.  Patient instructed to TAKE today's dose.  Patient verbalized understanding of these instructions.        FOLLOW-UP Return in 7 days (on 01/14/2011) for Follow up INR.  Jorene Guest, III Pharm.D., CACP

## 2011-01-07 NOTE — Progress Notes (Signed)
Subjective:    Patient ID: Corey Palmer, male    DOB: Dec 02, 1970, 40 y.o.   MRN: [id number]  HPI  Patient is a 40 year old male with a past medical history listed below, presents to the outpatient clinic complains of upper respiratory infection symptoms for the past week, currently denies any fever, and reports some dry cough. She reports that he has been off his Coumadin for the past 3 months given that he did not have time to come for followup, this is being given to him for A. fib with a chads2 score of 3. Poorly controlled diabetic, did not bring in his meter. No other complaints  Patient Active Problem List  Diagnoses  . AODM  . HYPERTRIGLYCERIDEMIA  . DYSLIPIDEMIA  . GOUT, ACUTE  . GOUT, UNSPECIFIED  . METABOLIC ACIDOSIS  . OBESITY  . ESSENTIAL HYPERTENSION, BENIGN  . CAD  . PERICARDIAL EFFUSION  . CARDIOMYOPATHY  . ATRIAL FIBRILLATION  . CHF  . HEMATURIA UNSPECIFIED  . HYPOVOLEMIC SHOCK  . AUTOMATIC IMPLANTABLE CARDIAC DEFIBRILLATOR SITU  . Long term current use of anticoagulant   Current Outpatient Prescriptions on File Prior to Visit  Medication Sig Dispense Refill  . ACCU-CHEK FASTCLIX LANCETS MISC 1 each by Does not apply route 3 (three) times daily before meals. Use to check blood sugar up to 4x daily Dx code- 250.00  200 each  12  . Blood Glucose Monitoring Suppl (BLOOD GLUCOSE METER) kit Check CBG 4 times a day       . COLCRYS 0.6 MG tablet TAKE 1 TABLET BY MOUTH ONCE A DAY  30 tablet  3  . digoxin (LANOXIN) 0.25 MG tablet Take 1 tablet (250 mcg total) by mouth daily.  30 tablet  1  . furosemide (LASIX) 20 MG tablet Take 1 tablet (20 mg total) by mouth 2 (two) times daily.  60 tablet  11  . glucose blood (ACCU-CHEK SMARTVIEW) test strip Use as instructed to check blood sugar up to 4 times daily Dx code: 250.00  150 each  12  . insulin aspart (NOVOLOG FLEXPEN) 100 UNIT/ML injection Inject 5 Units into the skin 3 (three) times daily before meals. Inject 15 minutes  before each meal  10 mL  5  . insulin glargine (LANTUS SOLOSTAR) 100 UNIT/ML injection Inject 25 Units into the skin daily.  9 mL  1  . Insulin Pen Needle (B-D ULTRAFINE III SHORT PEN) 31G X 8 MM MISC Use to inject insulin 4 times a day  200 each  5  . lisinopril (PRINIVIL,ZESTRIL) 10 MG tablet Take 1 tablet (10 mg total) by mouth daily.  30 tablet  1  . TIKOSYN 250 MCG capsule TAKE 1 CAPSULE TWICE A DAY  60 capsule  3  . warfarin (COUMADIN) 10 MG tablet Take as directed  5 tablet  0   Allergies  Allergen Reactions  . Penicillins      Review of Systems  Constitutional: Positive for fatigue.  Respiratory: Positive for cough.   All other systems reviewed and are negative.       Objective:   Physical Exam  Constitutional: He is oriented to person, place, and time. He appears well-developed and well-nourished.  HENT:  Head: Normocephalic and atraumatic.  Eyes: Pupils are equal, round, and reactive to light.  Neck: Normal range of motion. No JVD present. No thyromegaly present.  Cardiovascular: Normal rate and normal heart sounds.  An irregularly irregular rhythm present.  Pulmonary/Chest: Effort normal and breath sounds normal.  He has no wheezes. He has no rales.  Abdominal: Soft. Bowel sounds are normal. There is no tenderness. There is no rebound.  Musculoskeletal: Normal range of motion. He exhibits no edema.  Neurological: He is alert and oriented to person, place, and time.  Skin: Skin is warm and dry.          Assessment & Plan:

## 2011-01-07 NOTE — Patient Instructions (Signed)
Patient instructed to take medications as defined in the Anti-coagulation Track section of this encounter.  Patient instructed to TAKE today's dose.  Patient verbalized understanding of these instructions.

## 2011-01-14 ENCOUNTER — Ambulatory Visit: Payer: Self-pay | Admitting: Internal Medicine

## 2011-01-14 ENCOUNTER — Ambulatory Visit: Payer: Self-pay

## 2011-01-29 DIAGNOSIS — C801 Malignant (primary) neoplasm, unspecified: Secondary | ICD-10-CM

## 2011-01-29 HISTORY — DX: Malignant (primary) neoplasm, unspecified: C80.1

## 2011-02-18 ENCOUNTER — Other Ambulatory Visit: Payer: Self-pay

## 2011-02-18 ENCOUNTER — Encounter (HOSPITAL_COMMUNITY): Payer: Self-pay

## 2011-02-18 ENCOUNTER — Inpatient Hospital Stay (HOSPITAL_COMMUNITY)
Admission: EM | Admit: 2011-02-18 | Discharge: 2011-02-20 | DRG: 313 | Disposition: A | Discharge status: Home / Self Care | Payer: Medicaid Other | Attending: Infectious Diseases | Admitting: Infectious Diseases

## 2011-02-18 ENCOUNTER — Emergency Department (HOSPITAL_COMMUNITY): Payer: Medicaid Other

## 2011-02-18 DIAGNOSIS — I2510 Atherosclerotic heart disease of native coronary artery without angina pectoris: Secondary | ICD-10-CM | POA: Diagnosis present

## 2011-02-18 DIAGNOSIS — I517 Cardiomegaly: Secondary | ICD-10-CM | POA: Diagnosis present

## 2011-02-18 DIAGNOSIS — I428 Other cardiomyopathies: Secondary | ICD-10-CM | POA: Diagnosis present

## 2011-02-18 DIAGNOSIS — M109 Gout, unspecified: Secondary | ICD-10-CM | POA: Insufficient documentation

## 2011-02-18 DIAGNOSIS — R0789 Other chest pain: Principal | ICD-10-CM | POA: Diagnosis present

## 2011-02-18 DIAGNOSIS — Z95810 Presence of automatic (implantable) cardiac defibrillator: Secondary | ICD-10-CM

## 2011-02-18 DIAGNOSIS — IMO0001 Reserved for inherently not codable concepts without codable children: Secondary | ICD-10-CM | POA: Diagnosis present

## 2011-02-18 DIAGNOSIS — Z9689 Presence of other specified functional implants: Secondary | ICD-10-CM

## 2011-02-18 DIAGNOSIS — K219 Gastro-esophageal reflux disease without esophagitis: Secondary | ICD-10-CM | POA: Diagnosis present

## 2011-02-18 DIAGNOSIS — I10 Essential (primary) hypertension: Secondary | ICD-10-CM | POA: Diagnosis present

## 2011-02-18 DIAGNOSIS — I255 Ischemic cardiomyopathy: Secondary | ICD-10-CM | POA: Insufficient documentation

## 2011-02-18 DIAGNOSIS — E785 Hyperlipidemia, unspecified: Secondary | ICD-10-CM | POA: Diagnosis present

## 2011-02-18 DIAGNOSIS — R0602 Shortness of breath: Secondary | ICD-10-CM | POA: Diagnosis present

## 2011-02-18 DIAGNOSIS — I5022 Chronic systolic (congestive) heart failure: Secondary | ICD-10-CM | POA: Insufficient documentation

## 2011-02-18 DIAGNOSIS — R079 Chest pain, unspecified: Secondary | ICD-10-CM

## 2011-02-18 DIAGNOSIS — Z880 Allergy status to penicillin: Secondary | ICD-10-CM

## 2011-02-18 DIAGNOSIS — I4891 Unspecified atrial fibrillation: Secondary | ICD-10-CM | POA: Diagnosis present

## 2011-02-18 DIAGNOSIS — I509 Heart failure, unspecified: Secondary | ICD-10-CM | POA: Diagnosis present

## 2011-02-18 DIAGNOSIS — Z95811 Presence of heart assist device: Secondary | ICD-10-CM

## 2011-02-18 DIAGNOSIS — J40 Bronchitis, not specified as acute or chronic: Secondary | ICD-10-CM | POA: Diagnosis present

## 2011-02-18 DIAGNOSIS — E669 Obesity, unspecified: Secondary | ICD-10-CM | POA: Diagnosis present

## 2011-02-18 LAB — CARDIAC PANEL(CRET KIN+CKTOT+MB+TROPI)
CK, MB: 4 ng/mL (ref 0.3–4.0)
Relative Index: 3.3 — ABNORMAL HIGH (ref 0.0–2.5)
Relative Index: 3.3 — ABNORMAL HIGH (ref 0.0–2.5)
Total CK: 146 U/L (ref 7–232)
Troponin I: <0.3 ng/mL (ref <0.30)

## 2011-02-18 LAB — URINALYSIS, ROUTINE W REFLEX MICROSCOPIC
Bilirubin Urine: NEGATIVE
Glucose, UA: NEGATIVE mg/dL
Specific Gravity, Urine: 1.028 (ref 1.005–1.030)
pH: 6 (ref 5.0–8.0)

## 2011-02-18 LAB — COMPREHENSIVE METABOLIC PANEL
ALT: 43 U/L (ref 0–53)
AST: 36 U/L (ref 0–37)
Calcium: 9.1 mg/dL (ref 8.4–10.5)
Sodium: 135 mEq/L (ref 135–145)
Total Protein: 6.4 g/dL (ref 6.0–8.3)

## 2011-02-18 LAB — URINE MICROSCOPIC-ADD ON

## 2011-02-18 LAB — URINE CULTURE: Culture  Setup Time: 201301211800

## 2011-02-18 LAB — CBC
MCH: 28.8 pg (ref 26.0–34.0)
MCV: 81 fL (ref 78.0–100.0)
Platelets: 279 10*3/uL (ref 150–400)
RDW: 13.1 % (ref 11.5–15.5)
WBC: 10.6 10*3/uL — ABNORMAL HIGH (ref 4.0–10.5)

## 2011-02-18 LAB — DIFFERENTIAL
Basophils Absolute: 0 10*3/uL (ref 0.0–0.1)
Eosinophils Absolute: 0.2 10*3/uL (ref 0.0–0.7)
Eosinophils Relative: 2 % (ref 0–5)
Lymphocytes Relative: 20 % (ref 12–46)
Neutrophils Relative %: 70 % (ref 43–77)

## 2011-02-18 LAB — MRSA PCR SCREENING: MRSA by PCR: NEGATIVE

## 2011-02-18 LAB — PRO B NATRIURETIC PEPTIDE: Pro B Natriuretic peptide (BNP): 1349 pg/mL — ABNORMAL HIGH (ref 0–125)

## 2011-02-18 MED ORDER — HYDROCODONE-ACETAMINOPHEN 5-325 MG PO TABS
1.0000 | ORAL_TABLET | ORAL | Status: DC | PRN
  Administered 2011-02-18 – 2011-02-20 (×5): 2 via ORAL
  Administered 2011-02-20: 1 via ORAL
  Administered 2011-02-20 (×2): 2 via ORAL
  Filled 2011-02-18 (×10): qty 2

## 2011-02-18 MED ORDER — INSULIN ASPART 100 UNIT/ML ~~LOC~~ SOLN
0.0000 [IU] | Freq: Three times a day (TID) | SUBCUTANEOUS | Status: DC
  Administered 2011-02-19: 2 [IU] via SUBCUTANEOUS
  Administered 2011-02-19: 3 [IU] via SUBCUTANEOUS
  Administered 2011-02-19: 5 [IU] via SUBCUTANEOUS
  Administered 2011-02-20 (×3): 2 [IU] via SUBCUTANEOUS
  Filled 2011-02-18: qty 3

## 2011-02-18 MED ORDER — HYDROCODONE-ACETAMINOPHEN 5-325 MG PO TABS
1.0000 | ORAL_TABLET | ORAL | Status: DC | PRN
  Administered 2011-02-18 – 2011-02-19 (×2): 2 via ORAL

## 2011-02-18 MED ORDER — DIGOXIN 250 MCG PO TABS
250.0000 ug | ORAL_TABLET | Freq: Every day | ORAL | Status: DC
  Administered 2011-02-18 – 2011-02-20 (×3): 250 ug via ORAL
  Filled 2011-02-18 (×3): qty 1

## 2011-02-18 MED ORDER — SODIUM CHLORIDE 0.9 % IJ SOLN
3.0000 mL | Freq: Two times a day (BID) | INTRAMUSCULAR | Status: DC
  Administered 2011-02-18 – 2011-02-20 (×3): 3 mL via INTRAVENOUS

## 2011-02-18 MED ORDER — NITROGLYCERIN 0.4 MG SL SUBL
0.4000 mg | SUBLINGUAL_TABLET | SUBLINGUAL | Status: DC | PRN
  Administered 2011-02-18 (×3): 0.4 mg via SUBLINGUAL
  Filled 2011-02-18: qty 25

## 2011-02-18 MED ORDER — LISINOPRIL 10 MG PO TABS
10.0000 mg | ORAL_TABLET | Freq: Every day | ORAL | Status: DC
  Administered 2011-02-18 – 2011-02-20 (×3): 10 mg via ORAL
  Filled 2011-02-18 (×3): qty 1

## 2011-02-18 MED ORDER — ONDANSETRON HCL 4 MG PO TABS
4.0000 mg | ORAL_TABLET | Freq: Four times a day (QID) | ORAL | Status: DC | PRN
  Administered 2011-02-19: 4 mg via ORAL
  Filled 2011-02-18: qty 1

## 2011-02-18 MED ORDER — ONDANSETRON HCL 4 MG/2ML IJ SOLN: 4.0000 mg | Freq: Four times a day (QID) | INTRAMUSCULAR | Status: DC | PRN

## 2011-02-18 MED ORDER — INSULIN ASPART 100 UNIT/ML ~~LOC~~ SOLN
0.0000 [IU] | Freq: Every day | SUBCUTANEOUS | Status: DC
  Administered 2011-02-19: 2 [IU] via SUBCUTANEOUS

## 2011-02-18 MED ORDER — INSULIN GLARGINE 100 UNIT/ML ~~LOC~~ SOLN
15.0000 [IU] | Freq: Every day | SUBCUTANEOUS | Status: DC
  Administered 2011-02-18 – 2011-02-19 (×2): 15 [IU] via SUBCUTANEOUS
  Filled 2011-02-18: qty 3

## 2011-02-18 MED ORDER — WARFARIN SODIUM 10 MG PO TABS
10.0000 mg | ORAL_TABLET | Freq: Once | ORAL | Status: AC
  Administered 2011-02-18: 10 mg via ORAL
  Filled 2011-02-18: qty 1

## 2011-02-18 MED ORDER — INSULIN GLARGINE 100 UNIT/ML ~~LOC~~ SOLN: 15.0000 [IU] | Freq: Every day | SUBCUTANEOUS | Status: DC

## 2011-02-18 MED ORDER — ONDANSETRON HCL 4 MG/2ML IJ SOLN
4.0000 mg | Freq: Once | INTRAMUSCULAR | Status: AC
  Administered 2011-02-18: 4 mg via INTRAVENOUS
  Filled 2011-02-18: qty 2

## 2011-02-18 MED ORDER — FUROSEMIDE 20 MG PO TABS
20.0000 mg | ORAL_TABLET | Freq: Two times a day (BID) | ORAL | Status: DC
  Administered 2011-02-18: 20 mg via ORAL
  Filled 2011-02-18 (×3): qty 1

## 2011-02-18 MED ORDER — DOFETILIDE 250 MCG PO CAPS
250.0000 ug | ORAL_CAPSULE | ORAL | Status: DC
  Administered 2011-02-18: 250 ug via ORAL
  Filled 2011-02-18 (×2): qty 1

## 2011-02-18 MED ORDER — ACETAMINOPHEN 650 MG RE SUPP: 650.0000 mg | Freq: Four times a day (QID) | RECTAL | Status: DC | PRN

## 2011-02-18 MED ORDER — ACETAMINOPHEN 325 MG PO TABS
650.0000 mg | ORAL_TABLET | Freq: Four times a day (QID) | ORAL | Status: DC | PRN
  Administered 2011-02-19: 650 mg via ORAL
  Filled 2011-02-18: qty 2

## 2011-02-18 MED ORDER — SODIUM CHLORIDE 0.9 % IV SOLN
250.0000 mL | INTRAVENOUS | Status: DC | PRN
  Administered 2011-02-19: 250 mL via INTRAVENOUS

## 2011-02-18 MED ORDER — COLCHICINE 0.6 MG PO TABS
0.6000 mg | ORAL_TABLET | Freq: Every day | ORAL | Status: DC
  Administered 2011-02-19 – 2011-02-20 (×2): 0.6 mg via ORAL
  Filled 2011-02-18 (×2): qty 1

## 2011-02-18 MED ORDER — SODIUM CHLORIDE 0.9 % IJ SOLN: 3.0000 mL | INTRAMUSCULAR | Status: DC | PRN

## 2011-02-18 NOTE — ED Notes (Signed)
Attempted to call report to [phone] spoke with Angie, receiving nurse is transporting pt., awaiting return phone call

## 2011-02-18 NOTE — ED Notes (Signed)
Attempted to call report to floor, RN unavailable, awaiting return phone call

## 2011-02-18 NOTE — ED Notes (Signed)
Posey Pronto, MD at bedside, pt on cardiac monitor

## 2011-02-18 NOTE — Consult Note (Signed)
ELECTROPHYSIOLOGY CONSULT NOTE    Patient ID: DAQUANE FAILE MRN: [id number], DOB/AGE: Jun 11, 1970 41 y.o.  Admit date: 02/18/2011 Date of Consult: 02-18-2011  Primary Physician: Pryor Ochoa, MD, MD Primary Cardiologist: Cristopher Peru, MD  Reason for Consultation: ICD shocks  HPI: Mr. Bonifield is a 41 year old male with a history of non ischemic cardiomyopathy status post ICD implantation (Medtronic), atrial fibrillation, hypertension, dyslipidemia, and diabetes.  He lost his Medicaid coverage about a month ago and is in the process of trying to get it reinstated.  Since that time, he has been without his medications because he has been unable to afford them.  There have also been compliance issues in the past that the patient states is related to his financial situation.  About a week ago, he was outside and noted 2 shocks from his device.  He has also been having shortness of breath and chest pain for the past couple of weeks.  He thought he received another shock from his device this morninalug.  He presented to the ER for evaluation.  Device interrogation demonstrated 2 shocks on 02-11-2011 for apparent atrial fibrillation with rapid ventricular response.  There was no shock from his device this morning.   Past Medical History  Diagnosis Date  . Dilated cardiomyopathy     status post ICD placement in 2008  . Gout, unspecified   . Dyslipidemia   . Hypertension   . Diabetes uncomplicated adult-type II   . Heart failure   . CAD (coronary artery disease)      Surgical History:  Past Surgical History  Procedure Date  . Icd placement   . Cardiac pacemaker placement       (Not in a hospital admission)  Inpatient Medications:    . ondansetron (ZOFRAN) IV  4 mg Intravenous Once    Allergies:  Allergies  Allergen Reactions  . Penicillins Anaphylaxis    History   Social History  . Marital Status: Married    Spouse Name: N/A    Number of Children: N/A  . Years of  Education: N/A   Occupational History  . Not on file.   Social History Main Topics  . Smoking status: Never Smoker   . Smokeless tobacco: Not on file  . Alcohol Use: No  . Drug Use: No  . Sexually Active: Not on file   Other Topics Concern  . Not on file   Social History Narrative   Lives in [place].     Family History  Problem Relation Age of Onset  . Diabetes Mother   . Coronary artery disease Mother   . Diabetes Father   . Coronary artery disease Father     Alert and oriented middle aged male appearing stated age in no acute distress  HENT- normal Eyes- EOMI, without scleral icterus Skin- warm and dry; without rashes LN-neg Neck- supple without thyromegaly, JVP-flat, carotids brisk and full without bruits Back-without CVAT or kyphosis Lungs-clear to auscultation CV-Regular but somewhat rapid rate and rhythm, nl S1 and S2, no murmurs gallops or rubs, S4-absent Abd-soft with active bowel sounds; no midline pulsation or hepatomegaly Pulses-intact femoral and distal MKS-without gross deformity Neuro- Ax O, CN3-12 intact, grossly normal motor and sensory function Affect engaging  ECG sinus  Labs:   Lab Results  Component Value Date   WBC 10.6* 02/18/2011   HGB 15.3 02/18/2011   HCT 43.1 02/18/2011   MCV 81.0 02/18/2011   PLT 279 02/18/2011    Lab  02/18/11 1451  NA 135  K 4.2  CL 103  CO2 23  BUN 17  CREATININE 0.69  CALCIUM 9.1  PROT 6.4  BILITOT 0.3  ALKPHOS 108  ALT 43  AST 36  GLUCOSE 184*   Lab Results  Component Value Date   CKTOTAL 146 02/18/2011   CKMB 4.8* 02/18/2011   TROPONINI <0.30 02/18/2011   Lab Results  Component Value Date   CHOL 258* 08/03/2010   CHOL 207* 10/19/2009   CHOL 181 11/10/2008   Lab Results  Component Value Date   HDL 32* 08/03/2010   HDL 22* 10/19/2009   HDL 26* 11/10/2008   Lab Results  Component Value Date   LDLCALC Comment:   Not calculated due to Triglyceride >400. Suggest ordering Direct LDL (Unit Code:  [phone]).   Total Cholesterol/HDL Ratio:CHD Risk                        Coronary Heart Disease Risk Table                                        Men       Women          1/2 Average Risk              3.4        3.3              Average Risk              5.0        4.4           2X Average Risk              9.6        7.1           3X Average Risk             23.4       11.0 Use the calculated Patient Ratio above and the CHD Risk table  to determine the patient's CHD Risk. ATP III Classification (LDL):       < 100        mg/dL         Optimal      100 - 129     mg/dL         Near or Above Optimal      130 - 159     mg/dL         Borderline High      160 - 189     mg/dL         High       > 190        mg/dL         Very High   08/03/2010   LDLCALC NOT CALC mg/dL 10/19/2009   LDLCALC * mg/dL* 11/10/2008   Lab Results  Component Value Date   TRIG 516* 08/03/2010   TRIG 944* 10/19/2009   TRIG 510* 11/10/2008   Lab Results  Component Value Date   CHOLHDL 8.1 08/03/2010   CHOLHDL 9.4 Ratio 10/19/2009   CHOLHDL 7.0 Ratio 11/10/2008   Lab Results  Component Value Date   LDLDIRECT 131* 08/03/2010    Lab Results  Component Value Date   DDIMER  Value: 2.70  AT THE INHOUSE ESTABLISHED CUTOFF VALUE OF 0.48 ug/mL FEU, THIS ASSAY HAS BEEN DOCUMENTED IN THE LITERATURE TO HAVE A SENSITIVITY AND NEGATIVE PREDICTIVE VALUE OF AT LEAST 98 TO 99%.  THE TEST RESULT SHOULD BE CORRELATED WITH AN ASSESSMENT OF THE CLINICAL PROBABILITY OF DVT / VTE. SLIGHT HEMOLYSIS* 03/08/2008     Radiology/Studies: Dg Chest Port 1 View  02/18/2011  *RADIOLOGY REPORT*  Clinical Data: Chest pain, weakness, nausea  PORTABLE CHEST - 1 VIEW  Comparison: 12/13/2009  Findings: There is cardiomegaly with progression from prior exam. Status post median sternotomy.  Single lead cardiac pacemaker is unchanged in position.  Central vascular congestion without convincing pulmonary edema.  Elevation of the right hemidiaphragm. No focal infiltrate.   IMPRESSION: Cardiomegaly with progression from prior exam.  Single lead cardiac pacemaker is unchanged in position.  Central mild vascular congestion without convincing pulmonary edema.  No focal infiltrate.  Original Report Authenticated By: Lahoma Crocker, M.D.    DEVICE HISTORY: Medtronic single chamber ICD implanted 09-14-2007 by Dr Lovena Le- [phone] right ventricular lead.   ICD interrogation demonstrated normal device function with shocks for atrial fibrillation  ASSESSEMENT AND RECOMMENDATIONS 1) chf A/C systolic 2) afib s/p inapp shock 3) nonischemic cardiomyopathy 4) financial distress 5)diabetes 6) abrupt termination of all his meds  The situation is unfortunate   Pt needs to be restarted on his meds and those that he can get from [employer]  At this juncture would recommend no changes except the use of IV lasix for diuresis and we have reprogramed his ICD to try to reduce risk of inappr shocks   Signed,

## 2011-02-18 NOTE — ED Notes (Signed)
Spoke with Dr. Posey Pronto, request to be placed for stepdown bed

## 2011-02-18 NOTE — ED Notes (Signed)
[id number] Ready

## 2011-02-18 NOTE — ED Provider Notes (Signed)
History     CSN: [id number]  Arrival date & time 02/18/11  1341   None     Chief Complaint  Patient presents with  . AICD Problem    (Consider location/radiation/quality/duration/timing/severity/associated sxs/prior treatment) HPI Comments: Patient is a 41 year old man who said that his AICD fired twice last week #1 small socket one large one. He has an irregular heartbeat that causes this. He's had problems like this since age 79. His AICD was last changed in 2004. I was done at [facility]. He is followed for this condition by GregTaylor M.D., electrophysiologist.  Patient is a 41 y.o. male presenting with palpitations.  Palpitations  This is a recurrent problem. The current episode started 3 to 5 hours ago. Episode frequency:  the AICD firred today, and also last week. The problem has not changed since onset.Associated with: There was no apparent precipitating event for the AICD firing. Pertinent negatives include no fever. Associated symptoms comments: There were no associated symptoms this his AICD firing. It is noteworthy that he has been out of his prescribed medications for about 3 months.. He has tried nothing for the symptoms.    Past Medical History  Diagnosis Date  . Dilated cardiomyopathy     status post ICD placement in 2008  . Gout, unspecified   . Dyslipidemia   . Hypertension   . Diabetes uncomplicated adult-type II   . Heart failure   . CAD (coronary artery disease)     History reviewed. No pertinent past surgical history.  Family History  Problem Relation Age of Onset  . Diabetes Mother   . Coronary artery disease Mother   . Diabetes Father   . Coronary artery disease Father     History  Substance Use Topics  . Smoking status: Never Smoker   . Smokeless tobacco: Not on file  . Alcohol Use: No      Review of Systems  Constitutional: Negative.  Negative for fever and chills.  HENT: Negative.   Eyes: Negative.   Respiratory:  Negative.   Cardiovascular: Positive for palpitations.  Gastrointestinal: Negative.   Genitourinary: Negative.   Musculoskeletal: Negative.   Neurological: Negative.   Psychiatric/Behavioral: Negative.     Allergies  Penicillins  Home Medications   Current Outpatient Rx  Name Route Sig Dispense Refill  . COLCRYS 0.6 MG PO TABS  TAKE 1 TABLET BY MOUTH ONCE A DAY 30 tablet 3  . FUROSEMIDE 20 MG PO TABS Oral Take 1 tablet (20 mg total) by mouth 2 (two) times daily. 60 tablet 11  . INSULIN ASPART 100 UNIT/ML [place] SOLN Subcutaneous Inject 5 Units into the skin 3 (three) times daily before meals. Inject 15 minutes before each meal 10 mL 5  . INSULIN GLARGINE 100 UNIT/ML [place] SOLN Subcutaneous Inject 25 Units into the skin daily. 9 mL 1  . LISINOPRIL 10 MG PO TABS Oral Take 1 tablet (10 mg total) by mouth daily. 30 tablet 1  . METFORMIN HCL ER (MOD) 1000 MG PO TB24 Oral Take 1,000 mg by mouth daily with breakfast.    . TIKOSYN 250 MCG PO CAPS  TAKE 1 CAPSULE TWICE A DAY 60 capsule 3  . WARFARIN SODIUM 10 MG PO TABS  Take as directed 5 tablet 0    BP 125/95  Pulse 95  Resp 14  SpO2 99%  Physical Exam  Nursing note and vitals reviewed. Constitutional: He is oriented to person, place, and time. He appears well-developed and well-nourished.  In mild to moderate distress, complaining of chest pain. He also has some nausea.  HENT:  Head: Normocephalic and atraumatic.  Right Ear: External ear normal.  Left Ear: External ear normal.  Eyes: Conjunctivae and EOM are normal. Pupils are equal, round, and reactive to light.  Neck: Normal range of motion. Neck supple. No thyromegaly present.  Cardiovascular: Normal rate, regular rhythm and normal heart sounds.   Pulmonary/Chest: Effort normal and breath sounds normal.       He has a ICD in the left upper chest.  Abdominal: Soft. Bowel sounds are normal.  Musculoskeletal: Normal range of motion.  Lymphadenopathy:    He has no cervical  adenopathy.  Neurological: He is alert and oriented to person, place, and time. He has normal reflexes.  Skin: Skin is warm and dry.  Psychiatric: He has a normal mood and affect. His behavior is normal.    ED Course  Procedures (including critical care time)  2:19 PM  Date: 02/18/2011  Rate:92  Rhythm: normal sinus rhythm  QRS Axis: normal  Intervals: QT prolonged QRS:  Left ventricular hypertrophy; poor R wave progression in precordial leads suggests old anterior myocardial infarction.  ST/T Wave abnormalities: Inverted T waves in inferior and lateral leads, the result of left ventricular hypertrophy.  Conduction Disutrbances:none  Narrative Interpretation: Abnormal EKG  Old EKG Reviewed: unchanged  4:23 PM Case discussed with [facility], who will consult on pt.  Call to Internal Medicine Teaching Service, who will admit him.      1. AICD (automatic cardioverter/defibrillator) present   2. Diabetes mellitus             Mylinda Latina III, MD 02/18/11 [phone]

## 2011-02-18 NOTE — ED Notes (Addendum)
3700 refused pt due to active CP paging attending on call for request for a step down bed

## 2011-02-18 NOTE — Progress Notes (Signed)
ANTICOAGULATION CONSULT NOTE - Initial Consult  Pharmacy Consult for Coumadin Indication: atrial fibrillation  Allergies  Allergen Reactions  . Penicillins Anaphylaxis    Patient Measurements: Wt: 121 kg   Vital Signs: BP: 121/76 mmHg (01/21 1830) Pulse Rate: 96  (01/21 1801)  Labs:  Basename 02/18/11 1451  HGB 15.3  HCT 43.1  PLT 279  APTT 28  LABPROT 14.2  INR 1.08  HEPARINUNFRC --  CREATININE 0.69  CKTOTAL 146  CKMB 4.8*  TROPONINI <0.30   The CrCl is unknown because both a height and weight (above a minimum accepted value) are required for this calculation.  Medical History: Past Medical History  Diagnosis Date  . Dilated cardiomyopathy     status post ICD placement in 2008  . Gout, unspecified   . Dyslipidemia   . Hypertension   . Diabetes uncomplicated adult-type II   . Heart failure   . CAD (coronary artery disease)     Medications:  Pt reports taking Coumadin 10mg  daily PTA - however has not been able to afford meds for the last month and has been without.  Assessment: 41 yo M presented to the ED with complaints of ICD firing.  Pt history complicated by loss of insurance and noncompliance with medications secondary to cost.  INR subtherapeutic as expected with noncompliance.  Pt also started on Lovenox 40mg  SQ daily until INR therapeutic.  CHADS2 score = 3  Goal of Therapy:  INR 2-3   Plan:  Coumadin 10mg  PO x 1 tonight. Daily INR.  [employer], Pharm.D., BCPS Clinical Pharmacist Pager [phone]  02/18/2011,7:16 PM

## 2011-02-18 NOTE — ED Notes (Signed)
Spoke with Veva Holes, staff member of [employer], per report received the last shock was received on Jan 14th, according to report the battery & lead parameters are within normal limits,  during the end of Dec./beginning of Jan.  the thoracic impedance suggests that there is thoracic fluid present, Dr. Monia Pouch notified, can contact Veva Holes @ [phone] ext [phone]

## 2011-02-18 NOTE — ED Notes (Signed)
sts aicd fired twice on Tuesday or Wednesday and then again today.

## 2011-02-18 NOTE — H&P (Signed)
Hospital Admission Note Date: 02/18/2011  Patient name: Corey Palmer Medical record number: [id number] Date of birth: 30-Dec-1970 Age: 41 y.o. Gender: male PCP: Pryor Ochoa, MD, MD  Medical Service:  Attending: Dr. Orene Desanctis  First Contact: Dr. Ivor Costa Pager: [phone] Second Contact: Dr. Criss Alvine Pager: [phone]  After Hours: First Contact: Pager: [phone] Second Contact: Pager: [phone]  Chief Complaint: Shortness of breath, chest pain  History of Present Illness:  Patient is a 41 year old gentleman with a past medical history of dilated cardiomyopathy, CAD, Atrial fibrillation congestive heart failure and AICD. He stopped taking home medications from 3 weeks ago when he lost his Medicaid coverage. Since then he has experienced gradual exacerbation of dyspnea on exertion and orthopnea (needs 4 pillow to lie comfortably), easy fatigueability and exercise intolerance. In the past 5 days, he has had intermittent cough. He also complains substernal chest pain which he describes as a dull pain, 9/10 and radiate to both sides of his shoulders, not alleviated by resting. He reports nausea but has no vomiting. He denies coughing up sputum, fever, abdominal pain or diarrhea.  His AICD was last changed in 2009 and is followed by GregTaylor M.D., electrophysiologist.  A week ago he noted 2 shocks from his device. Today in the ED, Device interrogation demonstrated 2 shocks on 02-11-2011 for apparent atrial fibrillation with rapid ventricular response.His ICD was reprogrammed around 5 PM to reduce risk of inappr shocks.  Meds: Medications Prior to Admission  Medication Dose Route Frequency Provider Last Rate Last Dose  . HYDROcodone-acetaminophen (NORCO) 5-325 MG per tablet 1-2 tablet  1-2 tablet Oral Q4H PRN Criss Alvine, MD   2 tablet at 02/18/11 1911  . nitroGLYCERIN (NITROSTAT) SL tablet 0.4 mg  0.4 mg Sublingual Q5 Min x 3 PRN Criss Alvine, MD   0.4 mg at 02/18/11 1801  . ondansetron (ZOFRAN)  injection 4 mg  4 mg Intravenous Once Horatio Pel, MD   4 mg at 02/18/11 1626   Medications Prior to Admission  Medication Sig Dispense Refill  . COLCRYS 0.6 MG tablet TAKE 1 TABLET BY MOUTH ONCE A DAY  30 tablet  3  . furosemide (LASIX) 20 MG tablet Take 1 tablet (20 mg total) by mouth 2 (two) times daily.  60 tablet  11  . insulin aspart (NOVOLOG FLEXPEN) 100 UNIT/ML injection Inject 5 Units into the skin 3 (three) times daily before meals. Inject 15 minutes before each meal  10 mL  5  . insulin glargine (LANTUS SOLOSTAR) 100 UNIT/ML injection Inject 25 Units into the skin daily.  9 mL  1  . lisinopril (PRINIVIL,ZESTRIL) 10 MG tablet Take 1 tablet (10 mg total) by mouth daily.  30 tablet  1  . TIKOSYN 250 MCG capsule TAKE 1 CAPSULE TWICE A DAY  60 capsule  3  . warfarin (COUMADIN) 10 MG tablet Take as directed  5 tablet  0    Allergies: Penicillins  Past Medical History  Diagnosis Date  . Dilated cardiomyopathy     status post ICD placement in 2008  . Gout, unspecified   . Dyslipidemia   . Hypertension   . Diabetes uncomplicated adult-type II   . Heart failure   . CAD (coronary artery disease)   Atrial fibrillation Obesity   Past Surgical History  Procedure Date  . Icd placement   . Cardiac pacemaker placement    Family History  Problem Relation Age of Onset  . Diabetes Mother   . Coronary artery disease  Mother   . Diabetes Father   . Coronary artery disease Father    History   Social History  . Marital Status: Married    Spouse Name: N/A    Number of Children: 2 sons, 1 daughter.  . Years of Education: Graduation from college   Occupational History  . Not on file.   Social History Main Topics  . Smoking status: Never Smoker   . Smokeless tobacco: Not on file  . Alcohol Use: No  . Drug Use: No  . Sexually Active: Not on file   Other Topics Concern  . Not on file   Social History Narrative   Lives in [place].    Review of  Systems: Pertinent items are noted in HPI.  Physical Exam: Blood pressure 121/76, pulse 96, resp. rate 24, SpO2 100.00%.  General: In mild distress. He is oriented to person, place, and time. He appears well-developed and well-nourished.   Head: Normocephalic, atraumatic. Eyes: PERRL, EOMI, No signs of anemia or jaundince. Nose: Mucous membranes moist, not inflammed, nonerythematous. Throat: Oropharynx nonerythematous, no exudate appreciated.  Neck: No deformities, masses, or tenderness noted.Supple, No carotid Bruits, no JVD. Chest/Lungs: He has a ICD in the left upper chest. Slightly increased respiratory effort. Clear to auscultation BL without crackles or wheezes. Heart: RRR. S1 and S2 normal without gallop, murmur, or rubs. Abdomen: BS normoactive. Soft, Nondistended, non-tender. No masses or organomegaly. Extremities: Mild bilateral pretibial edema. Neurologic: A&O X3, CN II - XII are grossly intact. Motor strength is 5/5 in the all 4 extremities, Sensations intact to light touch, Cerebellar signs negative. Skin: No visible rashes, scars.    Lab results: Basic Metabolic Panel:  Basename 02/18/11 1451  NA 135  K 4.2  CL 103  CO2 23  GLUCOSE 184*  BUN 17  CREATININE 0.69  CALCIUM 9.1  MG --  PHOS --   Liver Function Tests:  Basename 02/18/11 1451  AST 36  ALT 43  ALKPHOS 108  BILITOT 0.3  PROT 6.4  ALBUMIN 2.7*   CBC:  Basename 02/18/11 1451  WBC 10.6*  NEUTROABS 7.4  HGB 15.3  HCT 43.1  MCV 81.0  PLT 279   Cardiac Enzymes:  Basename 02/18/11 1451  CKTOTAL 146  CKMB 4.8*  CKMBINDEX --  TROPONINI <0.30   BNP:  Basename 02/18/11 1509  PROBNP 1349.0*   Coagulation:  Basename 02/18/11 1451  LABPROT 14.2  INR 1.08   Imaging results:  Dg Chest Port 1 View  02/18/2011  *RADIOLOGY REPORT*  Clinical Data: Chest pain, weakness, nausea  PORTABLE CHEST - 1 VIEW  Comparison: 12/13/2009  Findings: There is cardiomegaly with progression from prior  exam. Status post median sternotomy.  Single lead cardiac pacemaker is unchanged in position.  Central vascular congestion without convincing pulmonary edema.  Elevation of the right hemidiaphragm. No focal infiltrate.  IMPRESSION: Cardiomegaly with progression from prior exam.  Single lead cardiac pacemaker is unchanged in position.  Central mild vascular congestion without convincing pulmonary edema.  No focal infiltrate.  Original Report Authenticated By: Lahoma Crocker, M.D.   Echocardiology: EF 08/29/2009, transesophageal echo: 15%.  12/02/2008, transthoracic echo: 26 %. 03/08/2008,  transthoracic echo: left ventricular ejection fraction was estimated being 10% to 15 %.   Other results:  EKG: Date/time: 02/18/2011 2:19 PM QRS Axis: normal  Intervals: QT prolonged  QRS: Left ventricular hypertrophy; poor R wave progression in precordial leads suggests old anterior myocardial infarction.  ST/T Wave abnormalities: Inverted T waves in inferior and  lateral leads, the result of left ventricular hypertrophy.  Conduction Disutrbances:none  Narrative Interpretation: Abnormal EKG  Old EKG Reviewed: unchanged   Assessment & Plan by Problem:  1) Chest pain: Patient with severe cardiac history. ACS high on differential. The EKG unchanged and first set of cardiac enzymes negative. Other possible causes includes Bronchitis(with severe coughing) and GERD. Plan : Admit to step down  - Nitrostat  - Pain management with Vicodin  - Cycle cardiac enzymes. - Follow cardiology recommendations.  2) CHF - s/p AICD in 2008 : Interrogation done . Dr. Lovena Le with Cardiology to see patient . Portable chest x-ray does not show pulmonary edema. Patient has clinical worsening of CHF- likely due to medication noncompliance.  Plan : Restart home meds . - Followup cardiology recommendations .  3) uncontrolled DM 2 : Last hemoglobin A1c 12.9 in July 2012 .  Plan : We'll check A1c  - Start Lantus and sensitive  sliding scale .  4) DVT prophylaxis : Coumadin    Signed: Lisabeth Devoid 02/18/2011, 7:12 PM   Resident Addendum to Medical Student Note   I have seen and examined the patient, and agree with the the medical student assessment and plan outlined above. Please see my brief note below for additional details.  HPI: Pt seen and evaluated along with Student. Agree with HPI.   OBJECTIVE: VS: Reviewed  Meds: Reviewed  Labs: Reviewed  Imaging: Reviewed   Physical Exam: General: Pt coughing intermittently and SOB  Lungs:  Clear to auscultation BL without crackles or wheezes.  Heart: RRR. S1 and S2 normal without murmur  Abdomen:  BS normoactive. Soft, Nondistended, non-tender.  No masses or organomegaly.  Extremities: No pretibial edema.     ASSESSMENT/ PLAN:  Formulated along with Student. Agree with A and P.    Criss Alvine, M.D. PGY2, Internal Medicine Resident 02/18/2011, 8:32 PM

## 2011-02-19 LAB — GLUCOSE, CAPILLARY
Glucose-Capillary: 195 mg/dL — ABNORMAL HIGH (ref 70–99)
Glucose-Capillary: 300 mg/dL — ABNORMAL HIGH (ref 70–99)

## 2011-02-19 LAB — BASIC METABOLIC PANEL
Chloride: 101 mEq/L (ref 96–112)
GFR calc Af Amer: >90 mL/min (ref >90)
GFR calc non Af Amer: >90 mL/min (ref >90)
Glucose, Bld: 249 mg/dL — ABNORMAL HIGH (ref 70–99)
Potassium: 4.1 mEq/L (ref 3.5–5.1)
Sodium: 136 mEq/L (ref 135–145)

## 2011-02-19 LAB — HEMOGLOBIN A1C: Mean Plasma Glucose: 324 mg/dL — ABNORMAL HIGH (ref <117)

## 2011-02-19 LAB — PROTIME-INR: Prothrombin Time: 14.5 seconds (ref 11.6–15.2)

## 2011-02-19 MED ORDER — GUAIFENESIN 100 MG/5ML PO SOLN
5.0000 mL | ORAL | Status: DC | PRN
  Administered 2011-02-19 – 2011-02-20 (×5): 100 mg via ORAL
  Filled 2011-02-19 (×5): qty 5

## 2011-02-19 MED ORDER — PANTOPRAZOLE SODIUM 40 MG PO TBEC
40.0000 mg | DELAYED_RELEASE_TABLET | Freq: Every day | ORAL | Status: DC
  Administered 2011-02-19 – 2011-02-20 (×2): 40 mg via ORAL
  Filled 2011-02-19 (×2): qty 1

## 2011-02-19 MED ORDER — FUROSEMIDE 10 MG/ML IJ SOLN
20.0000 mg | Freq: Every day | INTRAMUSCULAR | Status: DC
  Administered 2011-02-19 – 2011-02-20 (×2): 20 mg via INTRAVENOUS
  Filled 2011-02-19 (×2): qty 2

## 2011-02-19 MED ORDER — ENOXAPARIN SODIUM 40 MG/0.4ML ~~LOC~~ SOLN
40.0000 mg | SUBCUTANEOUS | Status: DC
  Administered 2011-02-19 – 2011-02-20 (×2): 40 mg via SUBCUTANEOUS
  Filled 2011-02-19 (×2): qty 0.4

## 2011-02-19 MED ORDER — DOFETILIDE 250 MCG PO CAPS
250.0000 ug | ORAL_CAPSULE | Freq: Two times a day (BID) | ORAL | Status: DC
  Administered 2011-02-19 – 2011-02-20 (×3): 250 ug via ORAL
  Filled 2011-02-19 (×4): qty 1

## 2011-02-19 MED ORDER — WARFARIN SODIUM 10 MG PO TABS
10.0000 mg | ORAL_TABLET | Freq: Once | ORAL | Status: AC
  Administered 2011-02-19: 10 mg via ORAL
  Filled 2011-02-19: qty 1

## 2011-02-19 NOTE — Progress Notes (Signed)
ANTICOAGULATION CONSULT NOTE - Follow Up Consult  Pharmacy Consult for Coumadin Indication: atrial fibrillation  Allergies  Allergen Reactions  . Penicillins Anaphylaxis    Patient Measurements: Height: 6\' 3"  (190.5 cm) Weight: 268 lb 1.3 oz (121.6 kg) IBW/kg (Calculated) : 84.5  Heparin Dosing Weight:   Vital Signs: Temp: 98.7 F (37.1 C) (01/22 0739) Temp src: Oral (01/22 0739) BP: 167/88 mmHg (01/22 0739) Pulse Rate: 87  (01/22 0739)  Labs:  Basename 02/19/11 0300 02/18/11 2205 02/18/11 1451  HGB -- -- 15.3  HCT -- -- 43.1  PLT -- -- 279  APTT -- -- 28  LABPROT 14.5 -- 14.2  INR 1.11 -- 1.08  HEPARINUNFRC -- -- --  CREATININE 0.80 -- 0.69  CKTOTAL 106 120 146  CKMB 3.9 4.0 4.8*  TROPONINI <0.30 <0.30 <0.30   Estimated Creatinine Clearance: 168.9 ml/min (by C-G formula based on Cr of 0.8).   Medications:  Scheduled:    . colchicine  0.6 mg Oral Daily  . digoxin  250 mcg Oral Daily  . dofetilide  250 mcg Oral Q12H  . furosemide  20 mg Intravenous Daily  . insulin aspart  0-5 Units Subcutaneous QHS  . insulin aspart  0-9 Units Subcutaneous TID WC  . insulin glargine  15 Units Subcutaneous QHS  . lisinopril  10 mg Oral Daily  . ondansetron (ZOFRAN) IV  4 mg Intravenous Once  . pantoprazole  40 mg Oral Q1200  . sodium chloride  3 mL Intravenous Q12H  . warfarin  10 mg Oral Once  . DISCONTD: dofetilide  250 mcg Oral Q24H  . DISCONTD: furosemide  20 mg Oral BID  . DISCONTD: insulin glargine  15 Units Subcutaneous QHS    Assessment: 40 YOM admitted with chest pain and SOB, Cardiac enzymes unrevealing.  Pharmacy asked to dose coumadin for afib.  Appears unable to afford medication and has not been taking meds and thus INR subtherapeutic.  Patient to be on coumadin 10mg  daily.    Goal of Therapy:  INR 2-3   Plan:  1. Coumadin 10mg  x1 2. Daily INR  Clovis Riley 02/19/2011,8:19 AM

## 2011-02-19 NOTE — Progress Notes (Signed)
Subjective: Patient reports chest pain decreased from 8/10 to 4/10. SOB improved. He still coughs and complains intermittent throbbing headache related to coughing.  Objective: Vital signs in last 24 hours: Filed Vitals:   02/19/11 0900 02/19/11 1000 02/19/11 1009 02/19/11 1108  BP: 104/82 101/66 101/66 101/65  Pulse: 96 87 80 90  Temp:    98.6 F (37 C)  TempSrc:    Oral  Resp:  18  16  Height:      Weight:      SpO2: 96% 94%  98%   Weight change:   Intake/Output Summary (Last 24 hours) at 02/19/11 1145 Last data filed at 02/19/11 1100  Gross per 24 hour  Intake    460 ml  Output    550 ml  Net    -90 ml   Physical Exam: Blood pressure 121/76, pulse 96, resp. rate 24, SpO2 100.00%.  General: No distress. He is oriented to person, place, and time. He appears well-developed and well-nourished.  Head: Normocephalic, atraumatic. Eyes: PERRL, EOMI, No signs of anemia or jaundince. Nose: Mucous membranes moist, not inflammed, nonerythematous. Throat: Oropharynx nonerythematous, no exudate appreciated.  Neck: No deformities, masses, or tenderness noted.Supple, No carotid Bruits, no JVD. Chest/Lungs: He has a ICD in the left upper chest. Slightly increased respiratory effort. Clear to auscultation BL without crackles or wheezes. Heart: RRR. S1 and S2 normal without gallop, murmur, or rubs. Abdomen: BS normoactive. Soft, Nondistended, non-tender. No masses or organomegaly. Extremities: Mild bilateral pretibial edema. Neurologic: A&O X3, CN II - XII are grossly intact. Motor strength is 5/5 in the all 4 extremities, Sensations intact to light touch, Cerebellar signs negative. Skin: No visible rashes, scars.  Lab Results: Basic Metabolic Panel:  Lab [id number] 0300 02/18/11 1451  NA 136 135  K 4.1 4.2  CL 101 103  CO2 27 23  GLUCOSE 249* 184*  BUN 18 17  CREATININE 0.80 0.69  CALCIUM 8.9 9.1  MG -- --  PHOS -- --   Liver Function Tests:  Lab 02/18/11 1451  AST 36  ALT  43  ALKPHOS 108  BILITOT 0.3  PROT 6.4  ALBUMIN 2.7*   CBC:  Lab 02/18/11 1451  WBC 10.6*  NEUTROABS 7.4  HGB 15.3  HCT 43.1  MCV 81.0  PLT 279   Cardiac Enzymes:  Lab 02/19/11 0300 02/18/11 2205 02/18/11 1451  CKTOTAL 106 120 146  CKMB 3.9 4.0 4.8*  CKMBINDEX -- -- --  TROPONINI <0.30 <0.30 <0.30   BNP:  Lab 02/18/11 1509  PROBNP 1349.0*   CBG:  Lab 02/19/11 0836  GLUCAP 195*   Hemoglobin A1C:  Lab 02/18/11 2205  HGBA1C 12.9*   Coagulation:  Lab 02/19/11 0300 02/18/11 1451  LABPROT 14.5 14.2  INR 1.11 1.08   Micro Results: Recent Results (from the past 240 hour(s))  MRSA PCR SCREENING     Status: Normal   Collection Time   02/18/11  9:20 PM      Component Value Range Status Comment   MRSA by PCR NEGATIVE  NEGATIVE  Final    Studies/Results: Dg Chest Port 1 View  02/18/2011  *RADIOLOGY REPORT*  Clinical Data: Chest pain, weakness, nausea  PORTABLE CHEST - 1 VIEW  Comparison: 12/13/2009  Findings: There is cardiomegaly with progression from prior exam. Status post median sternotomy.  Single lead cardiac pacemaker is unchanged in position.  Central vascular congestion without convincing pulmonary edema.  Elevation of the right hemidiaphragm. No focal infiltrate.  IMPRESSION: Cardiomegaly with  progression from prior exam.  Single lead cardiac pacemaker is unchanged in position.  Central mild vascular congestion without convincing pulmonary edema.  No focal infiltrate.  Original Report Authenticated By: Lahoma Crocker, M.D.   Medications: I have reviewed the patient's current medications. Scheduled Meds:   . colchicine  0.6 mg Oral Daily  . digoxin  250 mcg Oral Daily  . dofetilide  250 mcg Oral Q12H  . enoxaparin (LOVENOX) injection  40 mg Subcutaneous Q24H  . furosemide  20 mg Intravenous Daily  . insulin aspart  0-5 Units Subcutaneous QHS  . insulin aspart  0-9 Units Subcutaneous TID WC  . insulin glargine  15 Units Subcutaneous QHS  . lisinopril  10 mg  Oral Daily  . ondansetron (ZOFRAN) IV  4 mg Intravenous Once  . pantoprazole  40 mg Oral Q1200  . sodium chloride  3 mL Intravenous Q12H  . warfarin  10 mg Oral Once  . warfarin  10 mg Oral ONCE-1800  . DISCONTD: dofetilide  250 mcg Oral Q24H  . DISCONTD: furosemide  20 mg Oral BID  . DISCONTD: insulin glargine  15 Units Subcutaneous QHS   Continuous Infusions:  PRN Meds:.sodium chloride, acetaminophen, acetaminophen, HYDROcodone-acetaminophen, nitroGLYCERIN, ondansetron (ZOFRAN) IV, ondansetron, sodium chloride, DISCONTD: HYDROcodone-acetaminophen   Assessment/Plan:  1) Chest pain: Chest pain improved today. The EKG unchanged and 3 set of cardiac enzymes negative. ACS less likely. Possible causes: Bronchitis(with severe coughing) vs GERD. Plan:  - Pain management with Vicodin.  - Follow cardiology recommendations.  2) CHF - s/p AICD in 2008 : Interrogation done . Dr. Lovena Le with Cardiology to see patient . Portable chest x-ray does not show pulmonary edema. Patient has clinical worsening of CHF- likely due to medication noncompliance. Plan : Restart home meds . - Followup cardiology recommendations .  3) uncontrolled DM 2 : Last hemoglobin A1c 12.9 in July 2012. Glu: 184-249 mg/dl.  Plan :  - Continue Lantus and sensitive sliding scale.   4) DVT prophylaxis : Coumadin      LOS: 1 day   Dabo Xu 02/19/2011, 11:45 AM  Resident Addendum to Medical Student Note   I have seen and examined the patient, and agree with the the medical student assessment and plan outlined above. Please see my brief note below for additional details.  HPI: Pt feels a little better. Chest hurts with cough.   OBJECTIVE: VS: Reviewed  Meds: Reviewed  Labs: Reviewed  Imaging: Reviewed   Physical Exam: General: NAD  Lungs:  Normal respiratory effort. Clear to auscultation BL without crackles or wheezes.  Heart: RRR. S1 and S2 normal without murmur  Abdomen:  BS normoactive. Soft,  Nondistended, non-tender.  No masses or organomegaly.  Extremities: Minimal pretibial edema.     ASSESSMENT/ PLAN:  Agree with above A&P: Pt is ruled out for ACS. Pt with medication non-compliance due to financial issues.  Social work consult to help for financial issues and obtaining medications.  No new AICD firings.   Length of Stay: 1   Criss Alvine, M.D. PGY2, Internal Medicine Resident 02/19/2011, 2:45 PM

## 2011-02-19 NOTE — Progress Notes (Signed)
Inpatient Diabetes Program Recommendations  AACE/ADA: New Consensus Statement on Inpatient Glycemic Control (2009)  Target Ranges:  Prepandial:   less than 140 mg/dL      Peak postprandial:   less than 180 mg/dL (1-2 hours)      Critically ill patients:  140 - 180 mg/dL   Reason for Visit: Glucose at 249 mg/dL this am  Inpatient Diabetes Program Recommendations Insulin - Basal: Lantus ordered to start last HS but not given? Diet: Please change to or add carbohydrate modified which includes parameters for heart healthy as well.  Note: Pt may need meal coverage as well, as he takes 5 units Novolog with each meal at home. Thank you, Rosita Kea, RN, CNS, Diabetes Coordinator [phone])

## 2011-02-20 LAB — GLUCOSE, CAPILLARY
Glucose-Capillary: 199 mg/dL — ABNORMAL HIGH (ref 70–99)
Glucose-Capillary: 164 mg/dL — ABNORMAL HIGH (ref 70–99)

## 2011-02-20 MED ORDER — METOPROLOL SUCCINATE ER 100 MG PO TB24: 100.0000 mg | ORAL_TABLET | Freq: Every day | ORAL | Status: DC

## 2011-02-20 MED ORDER — LISINOPRIL 10 MG PO TABS: 10.0000 mg | ORAL_TABLET | Freq: Every day | ORAL | Status: DC

## 2011-02-20 MED ORDER — INSULIN PEN NEEDLE 31G X 8 MM MISC: Status: DC

## 2011-02-20 MED ORDER — FUROSEMIDE 40 MG PO TABS: 40.0000 mg | ORAL_TABLET | Freq: Two times a day (BID) | ORAL | Status: DC

## 2011-02-20 MED ORDER — WARFARIN SODIUM 10 MG PO TABS: 10.0000 mg | ORAL_TABLET | Freq: Once | ORAL | Status: DC

## 2011-02-20 MED ORDER — HYDROCODONE-HOMATROPINE 5-1.5 MG/5ML PO SYRP
5.0000 mL | ORAL_SOLUTION | Freq: Four times a day (QID) | ORAL | Status: DC | PRN
  Administered 2011-02-20: 5 mL via ORAL
  Filled 2011-02-20: qty 5

## 2011-02-20 MED ORDER — DIGOXIN 250 MCG PO TABS: 250.0000 ug | ORAL_TABLET | Freq: Every day | ORAL | Status: DC

## 2011-02-20 MED ORDER — HYDROCODONE-HOMATROPINE 5-1.5 MG/5ML PO SYRP: 5.0000 mL | ORAL_SOLUTION | Freq: Four times a day (QID) | ORAL | Status: AC | PRN

## 2011-02-20 MED ORDER — INSULIN ASPART PROT & ASPART (70-30 MIX) 100 UNIT/ML ~~LOC~~ SUSP: SUBCUTANEOUS | Status: DC

## 2011-02-20 MED ORDER — NITROGLYCERIN 0.4 MG SL SUBL: 0.4000 mg | SUBLINGUAL_TABLET | SUBLINGUAL | Status: DC | PRN

## 2011-02-20 MED ORDER — WARFARIN SODIUM 10 MG PO TABS
10.0000 mg | ORAL_TABLET | Freq: Once | ORAL | Status: AC
  Administered 2011-02-20: 10 mg via ORAL
  Filled 2011-02-20: qty 1

## 2011-02-20 NOTE — Progress Notes (Signed)
MD notified of patient's pain and nausea.  Patient requested that MD be called regarding pain.  Patient is having 8-9/10 headache pain, worsening with cough.  The patient states that when he coughs he feels nauseated.  Patient has been medicated accordingly with pain medication and nausea medication.   No new orders at this time.  Will continue to monitor patient.  Ninfa Meeker, RN

## 2011-02-20 NOTE — Discharge Summary (Signed)
Patient Name:  Corey Palmer  MRN: [id number]  PCP: Pryor Ochoa, MD, MD  DOB:  11-11-1970   CSN: [id number]     Date of Admission:  02/18/2011  Date of Discharge:  02/20/2011      Attending Physician:  Dr. Lars Mage       DISCHARGE DIAGNOSES: 1. Principal Problem: 2.  *Chest pain 3. Active Problems: 4.  AODM 5.  Gout, unspecified 6.  OBESITY 7.  Essential hypertension, benign 8.  CAD 9.  CARDIOMYOPATHY 10.  Atrial fibrillation 11.  CHF 12.  AUTOMATIC IMPLANTABLE CARDIAC DEFIBRILLATOR SITU   DISPOSITION AND FOLLOW-UP: MONTARIO DEVOL is to follow-up with the listed providers as detailed below. In his visit, please check his blood glucose level and adjust his 70/30 insulin dose. Patient can not afford for Lantus and Novolog due to lack of insurance.   Follow-up Information    Follow up with Providence Lanius, MD on 03/04/2011. (at 1:45 PM)    Contact information:   [address] [place] [address] [place] [phone]       Follow up with Caryl Bis, PHARMD on 02/25/2011. (at 11:00 AM)    Contact information:   [address] [place] [phone]         Discharge Orders    Future Appointments: Provider: Department: Dept Phone: Center:   02/25/2011 11:00 AM [facility] [id number] [facility]   03/04/2011 1:45 PM Providence Lanius, MD [facility] [id number] [facility]     Future Orders Please Complete By Expires   Diet Carb Modified      Diet - low sodium heart healthy      Increase activity slowly      Call MD for:  persistant nausea and vomiting      Call MD for:  severe uncontrolled pain      Call MD for:  difficulty breathing, headache or visual disturbances      Call MD for:  persistant dizziness or light-headedness      Call MD for:  extreme fatigue      (HEART FAILURE PATIENTS) Call MD:  Anytime you have any of the following symptoms: 1) 3 pound weight gain in 24 hours or 5 pounds in 1 week 2)  shortness of breath, with or without a dry hacking cough 3) swelling in the hands, feet or stomach 4) if you have to sleep on extra pillows at night in order to breathe.          DISCHARGE MEDICATIONS: Medication List  As of 02/20/2011  5:44 PM   STOP taking these medications         ACCU-CHEK FASTCLIX LANCETS Misc      Blood Glucose Meter kit      glucose blood test strip      insulin aspart 100 UNIT/ML injection      insulin glargine 100 UNIT/ML injection         TAKE these medications         COLCRYS 0.6 MG tablet   Generic drug: colchicine   TAKE 1 TABLET BY MOUTH ONCE A DAY      digoxin 0.25 MG tablet   Commonly known as: LANOXIN   Take 1 tablet (250 mcg total) by mouth daily.      furosemide 20 MG tablet   Commonly known as: LASIX   Take 40 mg by mouth 2 (two) times daily.  furosemide 40 MG tablet   Commonly known as: LASIX   Take 1 tablet (40 mg total) by mouth 2 (two) times daily.      HYDROcodone-homatropine 5-1.5 MG/5ML syrup   Commonly known as: HYCODAN   Take 5 mLs by mouth every 6 (six) hours as needed for cough.      insulin aspart protamine-insulin aspart (70-30) 100 UNIT/ML injection   Commonly known as: NOVOLOG 70/30   Inject 10 U subcutaneously twice daily, one is before breakfast and second dose is before dinner.      Insulin Pen Needle 31G X 8 MM Misc   Use to inject insulin 4 times a day      lisinopril 10 MG tablet   Commonly known as: PRINIVIL,ZESTRIL   Take 1 tablet (10 mg total) by mouth daily.      lisinopril 10 MG tablet   Commonly known as: PRINIVIL,ZESTRIL   Take 1 tablet (10 mg total) by mouth daily.      metFORMIN 1000 MG (MOD) 24 hr tablet   Commonly known as: GLUMETZA   Take 1,000 mg by mouth daily with breakfast.      metoprolol succinate 100 MG 24 hr tablet   Commonly known as: TOPROL-XL   Take 1 tablet (100 mg total) by mouth daily. Take with or immediately following a meal.      nitroGLYCERIN 0.4 MG SL tablet    Commonly known as: NITROSTAT   Place 1 tablet (0.4 mg total) under the tongue every 5 (five) minutes x 3 doses as needed for chest pain.      TIKOSYN 250 MCG capsule   Generic drug: dofetilide   TAKE 1 CAPSULE TWICE A DAY      warfarin 10 MG tablet   Commonly known as: COUMADIN   Take as directed      warfarin 10 MG tablet   Commonly known as: COUMADIN   Take 1 tablet (10 mg total) by mouth one time only at 6 PM.             CONSULTS: none   PROCEDURES PERFORMED:  Dg Chest Port 1 View  02/18/2011  *RADIOLOGY REPORT*  Clinical Data: Chest pain, weakness, nausea  PORTABLE CHEST - 1 VIEW  Comparison: 12/13/2009  Findings: There is cardiomegaly with progression from prior exam. Status post median sternotomy.  Single lead cardiac pacemaker is unchanged in position.  Central vascular congestion without convincing pulmonary edema.  Elevation of the right hemidiaphragm. No focal infiltrate.  IMPRESSION: Cardiomegaly with progression from prior exam.  Single lead cardiac pacemaker is unchanged in position.  Central mild vascular congestion without convincing pulmonary edema.  No focal infiltrate.  Original Report Authenticated By: Lahoma Crocker, M.D.     ADMISSION DATA: H&P: Patient is a 41 year old gentleman with a past medical history of dilated cardiomyopathy, CAD, Atrial fibrillation congestive heart failure and AICD. He stopped taking home medications from 3 weeks ago when he lost his Medicaid coverage. Since then he has experienced gradual exacerbation of dyspnea on exertion and orthopnea (needs 4 pillow to lie comfortably), easy fatigueability and exercise intolerance. In the past 5 days, he has had intermittent cough. He also complains substernal chest pain which he describes as a dull pain, 9/10 and radiate to both sides of his shoulders, not alleviated by resting. He reports nausea but has no vomiting. He denies coughing up sputum, fever, abdominal pain or diarrhea.  His AICD was last  changed in 2009 and is followed by  GregTaylor M.D., electrophysiologist.  A week ago he noted 2 shocks from his device. Today in the ED, Device interrogation demonstrated 2 shocks on 02-11-2011 for apparent atrial fibrillation with rapid ventricular response.His ICD was reprogrammed around 5 PM to reduce risk of inappr shocks   Physical Exam:  Blood pressure 121/76, pulse 96, resp. rate 24, SpO2 100.00%.  General: In mild distress. He is oriented to person, place, and time. He appears well-developed and well-nourished.   Head: Normocephalic, atraumatic. Eyes: PERRL, EOMI, No signs of anemia or jaundince. Nose: Mucous membranes moist, not inflammed, nonerythematous. Throat: Oropharynx nonerythematous, no exudate appreciated.  Neck: No deformities, masses, or tenderness noted.Supple, No carotid Bruits, no JVD. Chest/Lungs: He has a ICD in the left upper chest. Slightly increased respiratory effort. Clear to auscultation BL without crackles or wheezes. Heart: RRR. S1 and S2 normal without gallop, murmur, or rubs. Abdomen: BS normoactive. Soft, Nondistended, non-tender. No masses or organomegaly. Extremities: Mild bilateral pretibial edema. Neurologic: A&O X3, CN II - XII are grossly intact. Motor strength is 5/5 in the all 4 extremities, Sensations intact to light touch, Cerebellar signs negative. Skin: No visible rashes, scars.   Lab results: Basic Metabolic Panel:  Basename  02/18/11 1451   NA  135   K  4.2   CL  103   CO2  23   GLUCOSE  184*   BUN  17   CREATININE  0.69   CALCIUM  9.1   MG  --   PHOS  --    Liver Function Tests:  Basename  02/18/11 1451   AST  36   ALT  43   ALKPHOS  108   BILITOT  0.3   PROT  6.4   ALBUMIN  2.7*    CBC:  Basename  02/18/11 1451   WBC  10.6*   NEUTROABS  7.4   HGB  15.3   HCT  43.1   MCV  81.0   PLT  279    Cardiac Enzymes:  Basename  02/18/11 1451   CKTOTAL  146   CKMB  4.8*   CKMBINDEX  --   TROPONINI  <0.30     BNP:  Basename  02/18/11 1509   PROBNP  1349.0*    Coagulation:  Basename  02/18/11 1451   LABPROT  14.2   INR  1.08      HOSPITAL COURSE:  1) Chest pain with coughing: Patient had serial EKGs which were unchanged and 3 set of cardiac enzymes negative. ACS was excluded. CXR no infiltration. likely due to viral bronchitis or URI. He was treated with and anti-tussives and Vicodin.  His chest pain improved significant at discharge. He is to follow up in the outpatient clinics on 03/04/11.   2) CHF - s/p AICD in 2008: Patient had severe shortness of breath suggesting clinical worsening of CHF which was likely due to medication noncompliance after he lost medicaid coverage in Dec. 2012. Portable chest x-ray does not show pulmonary edema. His ICD was reprogrammed to reduce risk of inappropriate shocks. He was treated with lasix, lisinopril, digoxin, nitroglycerin.  His SOB improved significantly. For the financial issues of obtaining medications, heart failure clinic social worker was consulted. He was put on CHF medication program for getting some medications for free. Patient was also educated for medication compliance. He reports that he han been followed up by his cardiologist Dr. Army Melia. He promised to call to schedule an appointment in 1 - 2 weeks after back  to home.   3) Uncontrolled DM 2 : DM (diabetes mellitus), type 2: Last hemoglobin A1c 12.9 in July 2012. Glu 195-300 mg/dl. He was placed on sliding scale insulin and received diabetic education in hospital. He responded to the regimen well. At discharge, he has no polyuria, polydipsia or polyphagia. CBG: 184-249 mg/dl.  Patient was educated about symptoms of hypoglycemia and how to treat this at home. We asked him to call clinic if he had glucose greater than 400. He is discharged home on 70/30 mixed insulin, 10 U bid. He will follow up in the outpatient clinics 03/04/11.  4.) A. Fib: heart rate is well controlled. Patient has not  been complaint with his coumadin. His INR was 1.08 on admission. We started his coumadin per pharmacy. At discharge, his IRN is 1.39. He got free medication of coumadin from heart failure program. He is discharge on 10 mg po daily dose and is to follow up with Dr. Elie Confer at 02/25/11.   DISCHARGE DATA: Vital Signs: BP 138/92  Pulse 100  Temp(Src) 97.6 F (36.4 C) (Oral)  Resp 18  Ht 6\' 3"  (1.905 m)  Wt 268 lb 1.3 oz (121.6 kg)  BMI 33.51 kg/m2  SpO2 98%  Labs: Results for orders placed during the hospital encounter of 02/18/11 (from the past 24 hour(s))  GLUCOSE, CAPILLARY     Status: Abnormal   Collection Time   02/19/11 10:12 PM      Component Value Range   Glucose-Capillary 212 (*) 70 - 99 (mg/dL)  PROTIME-INR     Status: Abnormal   Collection Time   02/20/11  3:58 AM      Component Value Range   Prothrombin Time 17.3 (*) 11.6 - 15.2 (seconds)   INR 1.39  0.00 - 1.49   GLUCOSE, CAPILLARY     Status: Abnormal   Collection Time   02/20/11  7:47 AM      Component Value Range   Glucose-Capillary 192 (*) 70 - 99 (mg/dL)  GLUCOSE, CAPILLARY     Status: Abnormal   Collection Time   02/20/11 11:58 AM      Component Value Range   Glucose-Capillary 199 (*) 70 - 99 (mg/dL)   Comment 1 Notify RN    GLUCOSE, CAPILLARY     Status: Abnormal   Collection Time   02/20/11  4:29 PM      Component Value Range   Glucose-Capillary 164 (*) 70 - 99 (mg/dL)   Comment 1 Notify RN      The time spent on discharging this patient is about 50 min.  Signed: Ivor Costa, MD PGY I, Internal Medicine Resident 02/20/2011, 5:44 PM

## 2011-02-20 NOTE — Progress Notes (Signed)
Subjective: Patient reports SOB and chest pain improving. He still coughs and complains intermittent throbbing headache when coughing.  Objective: Vital signs in last 24 hours: Filed Vitals:   02/20/11 0439 02/20/11 0500 02/20/11 0600 02/20/11 0800  BP: 146/97 144/101  133/91  Pulse: 98 98 97 96  Temp: 97.9 F (36.6 C)   99.8 F (37.7 C)  TempSrc: Oral   Oral  Resp: 22   18  Height:      Weight:      SpO2: 91% 94% 96% 96%   Weight change: 0 kg (0 lb)  Intake/Output Summary (Last 24 hours) at 02/20/11 0840 Last data filed at 02/20/11 0800  Gross per 24 hour  Intake    965 ml  Output   1200 ml  Net   -235 ml   Physical Exam: General: NAD  Chest/Lungs:   He has a ICD in the left upper chest. Normal respiratory effort. Clear to auscultation BL without crackles or wheezes.  Heart: RRR. S1 and S2 normal without murmur  Abdomen:  BS normoactive. Soft, Nondistended, non-tender.  No masses or organomegaly.  Extremities: Minimal pretibial edema.    Lab Results: Basic Metabolic Panel:  Lab [id number] 0300 02/18/11 1451  NA 136 135  K 4.1 4.2  CL 101 103  CO2 27 23  GLUCOSE 249* 184*  BUN 18 17  CREATININE 0.80 0.69  CALCIUM 8.9 9.1  MG -- --  PHOS -- --   Liver Function Tests:  Lab 02/18/11 1451  AST 36  ALT 43  ALKPHOS 108  BILITOT 0.3  PROT 6.4  ALBUMIN 2.7*   CBC:  Lab 02/18/11 1451  WBC 10.6*  NEUTROABS 7.4  HGB 15.3  HCT 43.1  MCV 81.0  PLT 279   Cardiac Enzymes:  Lab 02/19/11 0300 02/18/11 2205 02/18/11 1451  CKTOTAL 106 120 146  CKMB 3.9 4.0 4.8*  CKMBINDEX -- -- --  TROPONINI <0.30 <0.30 <0.30   BNP:  Lab 02/18/11 1509  PROBNP 1349.0*   CBG:  Lab 02/20/11 0747 02/19/11 2212 02/19/11 1650 02/19/11 1206 02/19/11 0836  GLUCAP 192* 212* 232* 300* 195*   Hemoglobin A1C:  Lab 02/18/11 2205  HGBA1C 12.9*   Coagulation:  Lab 02/20/11 0358 02/19/11 0300 02/18/11 1451  LABPROT 17.3* 14.5 14.2  INR 1.39 1.11 1.08   Micro  Results: Recent Results (from the past 240 hour(s))  URINE CULTURE     Status: Normal   Collection Time   02/18/11  5:31 PM      Component Value Range Status Comment   Specimen Description URINE, CLEAN CATCH   Final    Special Requests NONE   Final    Setup Time [id number]   Final    Colony Count 7,000 COLONIES/ML   Final    Culture INSIGNIFICANT GROWTH   Final    Report Status 02/19/2011 FINAL   Final   MRSA PCR SCREENING     Status: Normal   Collection Time   02/18/11  9:20 PM      Component Value Range Status Comment   MRSA by PCR NEGATIVE  NEGATIVE  Final    Studies/Results: Dg Chest Port 1 View  02/18/2011  *RADIOLOGY REPORT*  Clinical Data: Chest pain, weakness, nausea  PORTABLE CHEST - 1 VIEW  Comparison: 12/13/2009  Findings: There is cardiomegaly with progression from prior exam. Status post median sternotomy.  Single lead cardiac pacemaker is unchanged in position.  Central vascular congestion without convincing pulmonary edema.  Elevation  of the right hemidiaphragm. No focal infiltrate.  IMPRESSION: Cardiomegaly with progression from prior exam.  Single lead cardiac pacemaker is unchanged in position.  Central mild vascular congestion without convincing pulmonary edema.  No focal infiltrate.  Original Report Authenticated By: Lahoma Crocker, M.D.   Medications: I have reviewed the patient's current medications. Scheduled Meds:    . colchicine  0.6 mg Oral Daily  . digoxin  250 mcg Oral Daily  . dofetilide  250 mcg Oral Q12H  . enoxaparin (LOVENOX) injection  40 mg Subcutaneous Q24H  . furosemide  20 mg Intravenous Daily  . insulin aspart  0-5 Units Subcutaneous QHS  . insulin aspart  0-9 Units Subcutaneous TID WC  . insulin glargine  15 Units Subcutaneous QHS  . lisinopril  10 mg Oral Daily  . pantoprazole  40 mg Oral Q1200  . sodium chloride  3 mL Intravenous Q12H  . warfarin  10 mg Oral ONCE-1800  . warfarin  10 mg Oral ONCE-1800   Continuous Infusions:  PRN  Meds:.sodium chloride, acetaminophen, acetaminophen, guaiFENesin, HYDROcodone-acetaminophen, nitroGLYCERIN, ondansetron (ZOFRAN) IV, ondansetron, sodium chloride, DISCONTD: HYDROcodone-acetaminophen   Assessment/Plan:  1) Chest pain: Chest pain improved. The EKG unchanged and 3 set of cardiac enzymes negative. ACS less likely. Possible causes: Bronchitis(with severe coughing) vs GERD. Plan:  - Pain management with Vicodin.  - Follow cardiology recommendations.  2) CHF - s/p AICD in 2008, Hx of atrial fibrilation :Shortness of breath improved. Interrogation done. Portable chest x-ray does not show pulmonary edema. Patient has clinical worsening of CHF- likely due to medication noncompliance. Plan : Continue home meds . - Followup cardiology recommendations.  3) uncontrolled DM 2 : Last hemoglobin A1c 12.9 in July 2012. Glu: 184-249 mg/dl.  Plan :  - Continue insulin treatment (70/30).   4) DVT prophylaxis :  - Coumadin and follow up INR  5) Medication non-compliance: Social worker consult to help for financial issues and obtaining medications. Patient educated for Medication compliance.  Patient's chest pain and SOB are significantly improved and is to be followed in the clinic. He reports that he will call his cardiologist Dr. Army Melia to schedule an appointment in 1 - 2 weeks after back to home.   LOS: 2 days   Dabo Xu 02/20/2011, 8:40 AM    Resident Addendum to Medical Student Note   I have seen and examined the patient, and agree with the the medical student assessment and plan outlined above. Please see my brief note below for additional details.  OBJECTIVE: VS: Reviewed  Meds: Reviewed  Labs: Reviewed  Imaging: Reviewed    Physical Exam: General: NAD  Chest/Lungs:   He has a ICD in the left upper chest. Normal respiratory effort. Clear to auscultation BL without crackles or wheezes.  Heart: RRR. S1 and S2 normal without murmur  Abdomen:  BS normoactive. Soft,  Nondistended, non-tender.  No masses or organomegaly.  Extremities: Minimal pretibial edema.    ASSESSMENT/ PLAN:  I agree with student's assessment and plan with some additional points as follows:   Patient will be discharged home today. Because he dose not have health insurance. I changed his lantus to 70/30 10 Units Bid. I talked with Amy Stuchey who is heart failure clinic social work and got help for patient to get free medications. Patient said that he still has two bottles of Dofetilide supply for two months. He also said that he is able to afford 70/30 mix insulin.    Length of Stay: 2  Ivor Costa, MD.  PGY1, Internal Medicine Resident 02/20/2011, 5:29 PM

## 2011-02-20 NOTE — Progress Notes (Signed)
Order to d/c patient home.  Reviewed all d/c instructions and medications.  Pt verbalized understanding of instructions and written copy given.  Discussed cardiomyopathy and HF.  Pt has HF booklet and handouts.  Verbalized understanding of which medications need to be taken tonight and which to start tomorrow, written instruction given.  IV d/ced without problem.  Reviewed s/s of infection at IV site.  Pt verbalized understanding.  Discussed when to call MD and f/u appt.  Written instruction gvein.  Pt denies further questions and has number to call if questions arise.  All belongings packed.  Pt to d/c home with wife.  Will take out in [facility].

## 2011-02-20 NOTE — Progress Notes (Signed)
CSW screened patient for the Heart Failure assistance program. Patient has no insurance at this time and qualifies for the program. CSW also reviewed the Heart Failure packet with the patient and discussed the importance of HF management. CSW reinforced the importance of weighing daily, eating a low NA diet and adhering to all medication regimens.  CSW also reviewed the zone tool with the patient and stressed the importance of contacting his primary cardiologist when he experiences any symptoms in the yellow zone. Patient was appreciative of all information and resources provided by CSW. Clinical Social Worker will sign off for now as social work intervention is no longer needed. Please consult [place] again if new need arises.

## 2011-02-20 NOTE — Progress Notes (Signed)
ANTICOAGULATION CONSULT NOTE - Follow Up Consult  Pharmacy Consult for Coumadin Indication: atrial fibrillation  Allergies  Allergen Reactions  . Penicillins Anaphylaxis    Patient Measurements: Height: 6\' 3"  (190.5 cm) Weight: 268 lb 1.3 oz (121.6 kg) IBW/kg (Calculated) : 84.5  Heparin Dosing Weight:   Vital Signs: Temp: 97.9 F (36.6 C) (01/23 0439) Temp src: Oral (01/23 0439) BP: 144/101 mmHg (01/23 0500) Pulse Rate: 97  (01/23 0600)  Labs:  Basename 02/20/11 0358 02/19/11 0300 02/18/11 2205 02/18/11 1451  HGB -- -- -- 15.3  HCT -- -- -- 43.1  PLT -- -- -- 279  APTT -- -- -- 28  LABPROT 17.3* 14.5 -- 14.2  INR 1.39 1.11 -- 1.08  HEPARINUNFRC -- -- -- --  CREATININE -- 0.80 -- 0.69  CKTOTAL -- 106 120 146  CKMB -- 3.9 4.0 4.8*  TROPONINI -- <0.30 <0.30 <0.30   Estimated Creatinine Clearance: 168.9 ml/min (by C-G formula based on Cr of 0.8).   Medications:  Scheduled:     . colchicine  0.6 mg Oral Daily  . digoxin  250 mcg Oral Daily  . dofetilide  250 mcg Oral Q12H  . enoxaparin (LOVENOX) injection  40 mg Subcutaneous Q24H  . furosemide  20 mg Intravenous Daily  . insulin aspart  0-5 Units Subcutaneous QHS  . insulin aspart  0-9 Units Subcutaneous TID WC  . insulin glargine  15 Units Subcutaneous QHS  . lisinopril  10 mg Oral Daily  . pantoprazole  40 mg Oral Q1200  . sodium chloride  3 mL Intravenous Q12H  . warfarin  10 mg Oral ONCE-1800    Assessment: 40 YOM admitted with chest pain and SOB, Cardiac enzymes unrevealing.  Pharmacy asked to dose coumadin for afib.  Appears unable to afford medication and has not been taking meds and thus INR subtherapeutic at admit.  INR is trending up toward therapeutic range. No bleeding noted. On lovenox 40mg  SQ q24h.  Patient to be on coumadin 10mg  daily.    Goal of Therapy:  INR 2-3   Plan:  1. Coumadin 10mg  x1 2. Daily INR 3. D/C lovenox when INR >2.   Clovis Riley 02/20/2011,8:18  AM

## 2011-02-20 NOTE — Progress Notes (Signed)
Clinical Social Work-CSW received referral for financial assistance. This CSW contacted financial counselor who was to follow up as well as HF CSW for screening for HF medication program. No further needs from this CSW-Emari Hreha-MSW, [phone]

## 2011-02-20 NOTE — Progress Notes (Addendum)
Inpatient Diabetes Program Recommendations  AACE/ADA: New Consensus Statement on Inpatient Glycemic Control (2009)  Target Ranges:  Prepandial:   less than 140 mg/dL      Peak postprandial:   less than 180 mg/dL (1-2 hours)      Critically ill patients:  140 - 180 mg/dL   Reason for Visit: Need for conversion of home insulin of Lantus and Novolog to Novolin 70/30 (due to loss of medicaid).  Inpatient Diabetes Program Recommendations Insulin - Basal: Spoke with pharmacist regarding switching to 70/30 Novolin insulin from Lantus and Novolog meal coverage.  However, when referring to the Lantus guide for switching  from Lantus to 70/30 using the formula for 70/30 premix from Lantus: Lantus dose divided by 0.70 and again by  0.8, the total daily dose of 70./30  totals 45 units i.e. 22 units bid. But in addition to the Lantus pt was taking 5 units Novolog per meal  tid in addition for total of 15 units Novolog per day.  Therefore, if switching to 70/30 from Lantus and Novolog, would recommend a total of  60 units, as 30 units in the am and 30 units acsupper. Diet: Please change to or add carbohydrate modified which includes parameters for heart healthy as well.  Note: Agreed with pharmacist regarding doses but failed to account for the Novolog meal coverage pt was taking which increases total 70/30 to 30 units am and ac supper rather than 20 units am and ac supper. Might want to start this while still here, as the present regimen pt is on in the hospital is not controlling glucose.  Again either increase Lantus and add 5 units meal coverage or switch to 30 units 70/30 am and ac supper at this time. Thank you, Rosita Kea, RN, CNS, Diabetes Coordinator [phone])

## 2011-02-25 ENCOUNTER — Ambulatory Visit: Payer: Self-pay

## 2011-03-04 ENCOUNTER — Ambulatory Visit (INDEPENDENT_AMBULATORY_CARE_PROVIDER_SITE_OTHER): Payer: Self-pay | Admitting: Internal Medicine

## 2011-03-04 ENCOUNTER — Encounter: Payer: Self-pay | Admitting: Internal Medicine

## 2011-03-04 ENCOUNTER — Ambulatory Visit (INDEPENDENT_AMBULATORY_CARE_PROVIDER_SITE_OTHER): Payer: Self-pay | Admitting: Pharmacist

## 2011-03-04 VITALS — BP 124/92 | HR 81 | Temp 97.9°F | Ht 75.0 in | Wt 267.1 lb

## 2011-03-04 DIAGNOSIS — E781 Pure hyperglyceridemia: Secondary | ICD-10-CM

## 2011-03-04 DIAGNOSIS — Z23 Encounter for immunization: Secondary | ICD-10-CM

## 2011-03-04 DIAGNOSIS — I4891 Unspecified atrial fibrillation: Secondary | ICD-10-CM

## 2011-03-04 DIAGNOSIS — J069 Acute upper respiratory infection, unspecified: Secondary | ICD-10-CM

## 2011-03-04 DIAGNOSIS — Z7901 Long term (current) use of anticoagulants: Secondary | ICD-10-CM

## 2011-03-04 DIAGNOSIS — E119 Type 2 diabetes mellitus without complications: Secondary | ICD-10-CM

## 2011-03-04 DIAGNOSIS — E785 Hyperlipidemia, unspecified: Secondary | ICD-10-CM

## 2011-03-04 DIAGNOSIS — I10 Essential (primary) hypertension: Secondary | ICD-10-CM

## 2011-03-04 LAB — GLUCOSE, CAPILLARY: Glucose-Capillary: 166 mg/dL — ABNORMAL HIGH (ref 70–99)

## 2011-03-04 LAB — POCT INR
INR: 2.2
INR: 2.4

## 2011-03-04 NOTE — Patient Instructions (Signed)
Patient instructed to take medications as defined in the Anti-coagulation Track section of this encounter.  Patient instructed to take today's dose.  Patient verbalized understanding of these instructions.    

## 2011-03-04 NOTE — Progress Notes (Signed)
Anti-Coagulation Progress Note  JSON GERSH is a 41 y.o. male who is currently on an anti-coagulation regimen.    RECENT RESULTS: Recent results are below, the most recent result is correlated with a dose of 70 mg. per week: Lab Results  Component Value Date   INR 2.20 03/04/2011   INR 2.40 03/04/2011   INR 1.39 02/20/2011    ANTI-COAG DOSE:   Latest dosing instructions   Total Sun Mon Tue Wed Thu Fri Sat   70 10 mg 10 mg 10 mg 10 mg 10 mg 10 mg 10 mg    (10 mg1) (10 mg1) (10 mg1) (10 mg1) (10 mg1) (10 mg1) (10 mg1)         ANTICOAG SUMMARY: Anticoagulation Episode Summary              Current INR goal  Next INR check 03/18/2011   INR from last check 2.20 (03/04/2011)     Weekly max dose (mg)  Target end date    Indications Atrial fibrillation, Long term current use of anticoagulant   INR check location  Preferred lab    Send INR reminders to [place]   Comments        Provider Role Specialty Phone number   Cristopher Peru, MD  Cardiology [phone]        ANTICOAG TODAY: Anticoagulation Summary as of 03/04/2011              INR goal      Selected INR 2.20 (03/04/2011) Next INR check 03/18/2011   Weekly max dose (mg)  Target end date    Indications Atrial fibrillation, Long term current use of anticoagulant    Anticoagulation Episode Summary              INR check location  Preferred lab    Send INR reminders to [place]   Comments        Provider Role Specialty Phone number   Cristopher Peru, MD  Cardiology [phone]        PATIENT INSTRUCTIONS: Patient Instructions  Patient instructed to take medications as defined in the Anti-coagulation Track section of this encounter.  Patient instructed to take today's dose.  Patient verbalized understanding of these instructions.        FOLLOW-UP Return in 2 weeks (on 03/18/2011) for Follow up INR.  Jorene Guest, III Pharm.D., CACP

## 2011-03-04 NOTE — Patient Instructions (Addendum)
Return to clinic in 2 weeks on 03/18/11 to see MD, Dr. Elie Confer for INR check, and diabetes educator  Take your lantus 15 units each night and novolog 5 units with each meal  Check your blood sugar before each meal and before you go to bed each day.

## 2011-03-05 LAB — LIPID PANEL
LDL Cholesterol: 130 mg/dL — ABNORMAL HIGH (ref 0–99)
Triglycerides: 355 mg/dL — ABNORMAL HIGH (ref <150)
VLDL: 71 mg/dL — ABNORMAL HIGH (ref 0–40)

## 2011-03-10 ENCOUNTER — Encounter: Payer: Self-pay | Admitting: Internal Medicine

## 2011-03-10 NOTE — Progress Notes (Signed)
Lipid panel came back, LDL 130.  His goal is <100 based on diabetes.  Unsure why he is not already on a statin (no adverse reaction listed to statin in his allergies), especially as he has seen cardiology.  He is supposed to fu in 1 more week, so at that time confirm again that he does not take a statin and that he has not had any problems tolerating statins in past.  If he is not on statin and no history of statin intolerance, start him on statin.  He likely could be started on Pravastatin 40mg  daily.  LFTs were normal in Jan 2013.    Digoxin level 0.3.  He is on 259mcg daily.  He told me during last visit that he takes it regularly.  Given relatively high dose he is already on, would like to confirm compliance again with him before increasing dose.  At his fu upcoming confirm compliance, and if he is compliant, increase his digoxin dose.

## 2011-03-10 NOTE — Assessment & Plan Note (Signed)
Seen by Dr. Elie Confer today.  INR today looks good.

## 2011-03-10 NOTE — Assessment & Plan Note (Signed)
Check lipid panel today 

## 2011-03-10 NOTE — Assessment & Plan Note (Signed)
Pressure good on recheck today.  He says his high pressure initially today was during a coughing spell

## 2011-03-10 NOTE — Assessment & Plan Note (Signed)
His continued cough and rhinorrhea fits course of normally resolving viral URI

## 2011-03-10 NOTE — Assessment & Plan Note (Signed)
Will check a digoxin level today

## 2011-03-10 NOTE — Assessment & Plan Note (Signed)
Hospital d/c note says he is supposed to be taking 70/30 insulin 10 units BID.  However, he has been taking Lantus QHS 15 units and novolog 5 units TIDAC, as he got to take home pens of these that were used on him in the hospital and have not run out.  He has not taken any sugars because he could not afford strips.  Diabetes educator have him a temp meter and matching strips today, and he will fill script for scripts for his home meter once his medicaid comes through in a few days.  He is working on getting back on Medicaid, which he says is coming through very soon.  He is to meet with Bonna Gains about this today.  -Continue Lantus 15 units QHS and Novolog 5 units TIDAC.  Check sugars 4x daily, bring in meter to next visit.   -RTC 2 weeks -Defer eye exam referral, tdap until he has insurance -Flu shot given today

## 2011-03-10 NOTE — Progress Notes (Signed)
Subjective:   Patient ID: Corey Palmer male   DOB: 1970/11/08 41 y.o.   MRN: [id number]  HPI: Corey Palmer is a 41 y.o. with dilated cardiomyopathy, HLD, HTN, DM Type 2, CAD, heart failure here for hospital follow-up after being admitted on 02/18/11 for chest pain and AICD firing.  Chest pain was thought to be likely due to viral bronchitis or URI after serial EKGs unchanged and CEs negative x 3.    Since discharge he has seen neither cardiology nor Dr. Elie Confer for anticoagulation management, both of which the discharge summary notes he was supposed to have done by now.  Pt. Reports cardiology would not take him due to insurance.  It is unclear why he has not seen Dr. Elie Confer.  He has no CP today.  He has had some continued sinus drainage and coughing since leaving the hospital.  No fevers or chills.     Past Medical History  Diagnosis Date  . Dilated cardiomyopathy     status post ICD placement in 2008  . Gout, unspecified   . Dyslipidemia   . Hypertension   . Diabetes uncomplicated adult-type II   . Heart failure   . CAD (coronary artery disease)    Current Outpatient Prescriptions  Medication Sig Dispense Refill  . COLCRYS 0.6 MG tablet TAKE 1 TABLET BY MOUTH ONCE A DAY  30 tablet  3  . digoxin (LANOXIN) 0.25 MG tablet Take 1 tablet (250 mcg total) by mouth daily.  30 tablet  6  . furosemide (LASIX) 40 MG tablet Take 1 tablet (40 mg total) by mouth 2 (two) times daily.  60 tablet  6  . insulin aspart (NOVOLOG) 100 UNIT/ML injection Inject 5 Units into the skin 3 (three) times daily before meals.      . insulin glargine (LANTUS) 100 UNIT/ML injection Inject 15 Units into the skin at bedtime.      . Insulin Pen Needle (B-D ULTRAFINE III SHORT PEN) 31G X 8 MM MISC Use to inject insulin 4 times a day  200 each  5  . lisinopril (PRINIVIL,ZESTRIL) 10 MG tablet Take 1 tablet (10 mg total) by mouth daily.  30 tablet  6  . metoprolol succinate (TOPROL XL) 100 MG 24 hr tablet Take 1 tablet  (100 mg total) by mouth daily. Take with or immediately following a meal.  30 tablet  6  . TIKOSYN 250 MCG capsule TAKE 1 CAPSULE TWICE A DAY  60 capsule  3  . warfarin (COUMADIN) 10 MG tablet Take 1 tablet (10 mg total) by mouth one time only at 6 PM.  30 tablet  3  . nitroGLYCERIN (NITROSTAT) 0.4 MG SL tablet Place 1 tablet (0.4 mg total) under the tongue every 5 (five) minutes x 3 doses as needed for chest pain.  30 tablet  3   Family History  Problem Relation Age of Onset  . Diabetes Mother   . Coronary artery disease Mother   . Diabetes Father   . Coronary artery disease Father    History   Social History  . Marital Status: Married    Spouse Name: N/A    Number of Children: N/A  . Years of Education: N/A   Social History Main Topics  . Smoking status: Never Smoker   . Smokeless tobacco: None  . Alcohol Use: No  . Drug Use: No  . Sexually Active: None   Other Topics Concern  . None   Social History Narrative  [place]   Review of Systems: Constitutional: Denies fever, chills, diaphoresis, appetite change and fatigue.  Respiratory: Denies SOB, chest tightness,  and wheezing.   Cardiovascular: Denies chest pain, palpitations and leg swelling.  Gastrointestinal: Denies nausea, vomiting, abdominal pain, diarrhea, constipation, blood in stool and abdominal distention.  Genitourinary: Denies dysuria, urgency, frequency, hematuria, flank pain and difficulty urinating.  Skin: Denies pallor, rash and wound.  Neurological: Denies dizziness, seizures, syncope, weakness, light-headedness, numbness and headaches.    Objective:  Physical Exam: Filed Vitals:   03/04/11 1423  BP: 171/112  Pulse: 81  Temp: 97.9 F (36.6 C)  TempSrc: Oral  Height: 6\' 3"  (1.905 m)  Weight: 267 lb 1.6 oz (121.156 kg)   Constitutional: Vital signs reviewed.  Patient is a well-developed and well-nourished man in no acute distress and cooperative with exam. Alert and oriented x3.    Head: Normocephalic and atraumatic Ear: TM normal bilaterally Mouth: no erythema or exudates, MMM Eyes: PERRL, EOMI, conjunctivae normal, No scleral icterus.  Neck: No JVD Cardiovascular: RRR, S1 normal, S2 normal, no MRG, pulses symmetric and intact bilaterally Pulmonary/Chest: CTAB, no wheezes, rales, or rhonchi Neurological: A&O x3, Strength is normal and symmetric bilaterally, cranial nerve II-XII are grossly intact, no focal motor deficit, sensory intact to light touch bilaterally.  Skin: Warm, dry and intact. No rash, cyanosis, or clubbing.    Assessment & Plan:

## 2011-03-18 ENCOUNTER — Ambulatory Visit: Payer: Self-pay | Admitting: Dietician

## 2011-03-18 ENCOUNTER — Encounter: Payer: Self-pay | Admitting: Internal Medicine

## 2011-03-18 ENCOUNTER — Ambulatory Visit (INDEPENDENT_AMBULATORY_CARE_PROVIDER_SITE_OTHER): Payer: Self-pay | Admitting: Pharmacist

## 2011-03-18 DIAGNOSIS — I4891 Unspecified atrial fibrillation: Secondary | ICD-10-CM

## 2011-03-18 DIAGNOSIS — Z7901 Long term (current) use of anticoagulants: Secondary | ICD-10-CM

## 2011-03-18 NOTE — Progress Notes (Signed)
Anti-Coagulation Progress Note  Corey Palmer is a 40 y.o. male who is currently on an anti-coagulation regimen.    RECENT RESULTS: Recent results are below, the most recent result is correlated with a dose of 70 mg. per week: Lab Results  Component Value Date   INR 1.90 03/18/2011   INR 2.20 03/04/2011   INR 2.40 03/04/2011    ANTI-COAG DOSE:   Latest dosing instructions   Total Sun Mon Tue Wed Thu Fri Sat   80 10 mg 15 mg 10 mg 10 mg 15 mg 10 mg 10 mg    (10 mg1) (10 mg1.5) (10 mg1) (10 mg1) (10 mg1.5) (10 mg1) (10 mg1)         ANTICOAG SUMMARY: Anticoagulation Episode Summary              Current INR goal  Next INR check 04/08/2011   INR from last check 1.90 (03/18/2011)     Weekly max dose (mg)  Target end date    Indications Atrial fibrillation, Long term current use of anticoagulant   INR check location  Preferred lab    Send INR reminders to [place]   Comments        Provider Role Specialty Phone number   Cristopher Peru, MD  Cardiology [phone]        ANTICOAG TODAY: Anticoagulation Summary as of 03/18/2011              INR goal      Selected INR 1.90 (03/18/2011) Next INR check 04/08/2011   Weekly max dose (mg)  Target end date    Indications Atrial fibrillation, Long term current use of anticoagulant    Anticoagulation Episode Summary              INR check location  Preferred lab    Send INR reminders to [place]   Comments        Provider Role Specialty Phone number   Cristopher Peru, MD  Cardiology [phone]        PATIENT INSTRUCTIONS: Patient Instructions  Patient instructed to take medications as defined in the Anti-coagulation Track section of this encounter.  Patient instructed to take today's dose.  Patient verbalized understanding of these instructions.        FOLLOW-UP Return in 3 weeks (on 04/08/2011) for Follow up INR.  Jorene Guest, III Pharm.D., CACP

## 2011-03-18 NOTE — Patient Instructions (Signed)
Patient instructed to take medications as defined in the Anti-coagulation Track section of this encounter.  Patient instructed to take today's dose.  Patient verbalized understanding of these instructions.    

## 2011-04-03 ENCOUNTER — Encounter (HOSPITAL_COMMUNITY): Payer: Self-pay

## 2011-04-03 ENCOUNTER — Other Ambulatory Visit: Payer: Self-pay

## 2011-04-03 ENCOUNTER — Emergency Department (HOSPITAL_COMMUNITY): Payer: Medicaid Other

## 2011-04-03 ENCOUNTER — Telehealth: Payer: Self-pay | Admitting: *Deleted

## 2011-04-03 ENCOUNTER — Inpatient Hospital Stay (HOSPITAL_COMMUNITY)
Admission: EM | Admit: 2011-04-03 | Discharge: 2011-04-07 | DRG: 378 | Disposition: A | Discharge status: Home / Self Care | Payer: Medicaid Other | Attending: Internal Medicine | Admitting: Internal Medicine

## 2011-04-03 DIAGNOSIS — E785 Hyperlipidemia, unspecified: Secondary | ICD-10-CM | POA: Diagnosis present

## 2011-04-03 DIAGNOSIS — Z6833 Body mass index (BMI) 33.0-33.9, adult: Secondary | ICD-10-CM

## 2011-04-03 DIAGNOSIS — I4891 Unspecified atrial fibrillation: Secondary | ICD-10-CM | POA: Diagnosis present

## 2011-04-03 DIAGNOSIS — K253 Acute gastric ulcer without hemorrhage or perforation: Secondary | ICD-10-CM

## 2011-04-03 DIAGNOSIS — I472 Ventricular tachycardia, unspecified: Secondary | ICD-10-CM | POA: Diagnosis present

## 2011-04-03 DIAGNOSIS — R319 Hematuria, unspecified: Secondary | ICD-10-CM

## 2011-04-03 DIAGNOSIS — K920 Hematemesis: Secondary | ICD-10-CM | POA: Diagnosis present

## 2011-04-03 DIAGNOSIS — I5022 Chronic systolic (congestive) heart failure: Secondary | ICD-10-CM | POA: Diagnosis present

## 2011-04-03 DIAGNOSIS — R195 Other fecal abnormalities: Secondary | ICD-10-CM

## 2011-04-03 DIAGNOSIS — E669 Obesity, unspecified: Secondary | ICD-10-CM | POA: Diagnosis present

## 2011-04-03 DIAGNOSIS — L259 Unspecified contact dermatitis, unspecified cause: Secondary | ICD-10-CM | POA: Diagnosis present

## 2011-04-03 DIAGNOSIS — E119 Type 2 diabetes mellitus without complications: Secondary | ICD-10-CM | POA: Diagnosis present

## 2011-04-03 DIAGNOSIS — I428 Other cardiomyopathies: Secondary | ICD-10-CM | POA: Diagnosis present

## 2011-04-03 DIAGNOSIS — M109 Gout, unspecified: Secondary | ICD-10-CM | POA: Diagnosis present

## 2011-04-03 DIAGNOSIS — K250 Acute gastric ulcer with hemorrhage: Principal | ICD-10-CM | POA: Diagnosis present

## 2011-04-03 DIAGNOSIS — I10 Essential (primary) hypertension: Secondary | ICD-10-CM | POA: Diagnosis present

## 2011-04-03 DIAGNOSIS — Z794 Long term (current) use of insulin: Secondary | ICD-10-CM

## 2011-04-03 DIAGNOSIS — K529 Noninfective gastroenteritis and colitis, unspecified: Secondary | ICD-10-CM

## 2011-04-03 DIAGNOSIS — I2510 Atherosclerotic heart disease of native coronary artery without angina pectoris: Secondary | ICD-10-CM | POA: Diagnosis present

## 2011-04-03 DIAGNOSIS — K922 Gastrointestinal hemorrhage, unspecified: Secondary | ICD-10-CM

## 2011-04-03 DIAGNOSIS — Z7901 Long term (current) use of anticoagulants: Secondary | ICD-10-CM

## 2011-04-03 DIAGNOSIS — R160 Hepatomegaly, not elsewhere classified: Secondary | ICD-10-CM | POA: Diagnosis present

## 2011-04-03 DIAGNOSIS — I4729 Other ventricular tachycardia: Secondary | ICD-10-CM | POA: Diagnosis present

## 2011-04-03 HISTORY — DX: Obesity, class 1: E66.811

## 2011-04-03 HISTORY — DX: Obesity, unspecified: E66.9

## 2011-04-03 HISTORY — DX: Fatty (change of) liver, not elsewhere classified: K76.0

## 2011-04-03 HISTORY — DX: Chronic systolic (congestive) heart failure: I50.22

## 2011-04-03 HISTORY — DX: Paroxysmal atrial fibrillation: I48.0

## 2011-04-03 LAB — CBC
Hemoglobin: 15.4 g/dL (ref 13.0–17.0)
MCH: 28.4 pg (ref 26.0–34.0)
MCH: 27.2 pg (ref 26.0–34.0)
MCHC: 33 g/dL (ref 30.0–36.0)
MCV: 80.1 fL (ref 78.0–100.0)
MCV: 82.2 fL (ref 78.0–100.0)
Platelets: 207 10*3/uL (ref 150–400)
RDW: 13.4 % (ref 11.5–15.5)
WBC: 7.6 10*3/uL (ref 4.0–10.5)

## 2011-04-03 LAB — CARDIAC PANEL(CRET KIN+CKTOT+MB+TROPI): Troponin I: <0.3 ng/mL (ref <0.30)

## 2011-04-03 LAB — COMPREHENSIVE METABOLIC PANEL
ALT: 24 U/L (ref 0–53)
AST: 21 U/L (ref 0–37)
Calcium: 8.9 mg/dL (ref 8.4–10.5)
Potassium: 3.5 mEq/L (ref 3.5–5.1)
Sodium: 133 mEq/L — ABNORMAL LOW (ref 135–145)
Total Protein: 7 g/dL (ref 6.0–8.3)

## 2011-04-03 LAB — DIFFERENTIAL
Basophils Absolute: 0 10*3/uL (ref 0.0–0.1)
Eosinophils Absolute: 0.1 10*3/uL (ref 0.0–0.7)
Eosinophils Relative: 1 % (ref 0–5)
Lymphocytes Relative: 17 % (ref 12–46)
Neutrophils Relative %: 71 % (ref 43–77)

## 2011-04-03 LAB — POCT I-STAT TROPONIN I: Troponin i, poc: 0 ng/mL (ref 0.00–0.08)

## 2011-04-03 LAB — DIGOXIN LEVEL: Digoxin Level: 0.3 ng/mL — ABNORMAL LOW (ref 0.8–2.0)

## 2011-04-03 LAB — PROTIME-INR: INR: 2.03 — ABNORMAL HIGH (ref 0.00–1.49)

## 2011-04-03 MED ORDER — ONDANSETRON HCL 4 MG/2ML IJ SOLN
4.0000 mg | Freq: Once | INTRAMUSCULAR | Status: AC
  Administered 2011-04-03: 4 mg via INTRAVENOUS
  Filled 2011-04-03: qty 2

## 2011-04-03 MED ORDER — SODIUM CHLORIDE 0.9 % IV SOLN
250.0000 mL | INTRAVENOUS | Status: DC | PRN
  Administered 2011-04-04: 1000 mL via INTRAVENOUS
  Administered 2011-04-05: 500 mL via INTRAVENOUS

## 2011-04-03 MED ORDER — METOPROLOL SUCCINATE ER 100 MG PO TB24
100.0000 mg | ORAL_TABLET | Freq: Every day | ORAL | Status: DC
  Administered 2011-04-04 – 2011-04-07 (×4): 100 mg via ORAL
  Filled 2011-04-03 (×4): qty 1

## 2011-04-03 MED ORDER — SODIUM CHLORIDE 0.9 % IJ SOLN
3.0000 mL | Freq: Two times a day (BID) | INTRAMUSCULAR | Status: DC
  Administered 2011-04-04 – 2011-04-06 (×5): 3 mL via INTRAVENOUS

## 2011-04-03 MED ORDER — MORPHINE SULFATE 2 MG/ML IJ SOLN
2.0000 mg | INTRAMUSCULAR | Status: DC | PRN
  Administered 2011-04-03 – 2011-04-07 (×12): 2 mg via INTRAVENOUS
  Filled 2011-04-03 (×12): qty 1

## 2011-04-03 MED ORDER — SODIUM CHLORIDE 0.9 % IV SOLN: Freq: Once | INTRAVENOUS | Status: DC

## 2011-04-03 MED ORDER — DIGOXIN 250 MCG PO TABS
250.0000 ug | ORAL_TABLET | Freq: Every day | ORAL | Status: DC
  Administered 2011-04-04 – 2011-04-07 (×4): 250 ug via ORAL
  Filled 2011-04-03 (×4): qty 1

## 2011-04-03 MED ORDER — NITROGLYCERIN 0.4 MG SL SUBL
SUBLINGUAL_TABLET | SUBLINGUAL | Status: AC
  Administered 2011-04-03: 0.4 mg via SUBLINGUAL
  Filled 2011-04-03: qty 25

## 2011-04-03 MED ORDER — SODIUM CHLORIDE 0.9 % IJ SOLN
3.0000 mL | Freq: Two times a day (BID) | INTRAMUSCULAR | Status: DC
  Administered 2011-04-03: 3 mL via INTRAVENOUS

## 2011-04-03 MED ORDER — PANTOPRAZOLE SODIUM 40 MG IV SOLR
40.0000 mg | Freq: Two times a day (BID) | INTRAVENOUS | Status: DC
  Administered 2011-04-03 – 2011-04-07 (×7): 40 mg via INTRAVENOUS
  Filled 2011-04-03 (×10): qty 40

## 2011-04-03 MED ORDER — ONDANSETRON HCL 4 MG/2ML IJ SOLN
4.0000 mg | Freq: Four times a day (QID) | INTRAMUSCULAR | Status: DC | PRN
  Administered 2011-04-04 – 2011-04-05 (×2): 4 mg via INTRAVENOUS
  Filled 2011-04-03 (×2): qty 2

## 2011-04-03 MED ORDER — NITROGLYCERIN 0.4 MG SL SUBL: 0.4000 mg | SUBLINGUAL_TABLET | SUBLINGUAL | Status: DC | PRN

## 2011-04-03 MED ORDER — SODIUM CHLORIDE 0.9 % IV SOLN
INTRAVENOUS | Status: AC
  Administered 2011-04-03: 23:00:00 via INTRAVENOUS

## 2011-04-03 MED ORDER — FUROSEMIDE 40 MG PO TABS: 40.0000 mg | ORAL_TABLET | Freq: Two times a day (BID) | ORAL | Status: DC

## 2011-04-03 MED ORDER — ACETAMINOPHEN 325 MG PO TABS
650.0000 mg | ORAL_TABLET | Freq: Four times a day (QID) | ORAL | Status: DC | PRN
  Filled 2011-04-03: qty 2

## 2011-04-03 MED ORDER — SODIUM CHLORIDE 0.9 % IV SOLN
INTRAVENOUS | Status: AC
  Administered 2011-04-03: 18:00:00 via INTRAVENOUS

## 2011-04-03 MED ORDER — LISINOPRIL 10 MG PO TABS
10.0000 mg | ORAL_TABLET | Freq: Every day | ORAL | Status: DC
  Administered 2011-04-03 – 2011-04-07 (×5): 10 mg via ORAL
  Filled 2011-04-03 (×5): qty 1

## 2011-04-03 MED ORDER — SODIUM CHLORIDE 0.9 % IV BOLUS (SEPSIS)
1000.0000 mL | Freq: Once | INTRAVENOUS | Status: AC
  Administered 2011-04-03: 1000 mL via INTRAVENOUS

## 2011-04-03 MED ORDER — INSULIN ASPART 100 UNIT/ML ~~LOC~~ SOLN
0.0000 [IU] | Freq: Three times a day (TID) | SUBCUTANEOUS | Status: DC
  Administered 2011-04-04: 5 [IU] via SUBCUTANEOUS
  Administered 2011-04-04: 3 [IU] via SUBCUTANEOUS
  Administered 2011-04-04: 2 [IU] via SUBCUTANEOUS
  Administered 2011-04-05 – 2011-04-07 (×5): 3 [IU] via SUBCUTANEOUS
  Filled 2011-04-03: qty 3

## 2011-04-03 MED ORDER — HYDROMORPHONE HCL PF 1 MG/ML IJ SOLN
INTRAMUSCULAR | Status: AC
  Administered 2011-04-03: 1 mg via INTRAVENOUS
  Filled 2011-04-03: qty 1

## 2011-04-03 MED ORDER — HYDROMORPHONE HCL PF 1 MG/ML IJ SOLN
INTRAMUSCULAR | Status: AC
  Administered 2011-04-03: 1 mg
  Filled 2011-04-03: qty 1

## 2011-04-03 MED ORDER — INSULIN GLARGINE 100 UNIT/ML ~~LOC~~ SOLN
10.0000 [IU] | Freq: Every day | SUBCUTANEOUS | Status: DC
  Administered 2011-04-03 – 2011-04-05 (×3): 10 [IU] via SUBCUTANEOUS
  Filled 2011-04-03: qty 3

## 2011-04-03 MED ORDER — NITROGLYCERIN 0.4 MG SL SUBL
0.4000 mg | SUBLINGUAL_TABLET | SUBLINGUAL | Status: DC | PRN
  Administered 2011-04-03: 0.4 mg via SUBLINGUAL

## 2011-04-03 MED ORDER — MORPHINE SULFATE 4 MG/ML IJ SOLN: 4.0000 mg | Freq: Once | INTRAMUSCULAR | Status: DC

## 2011-04-03 MED ORDER — SODIUM CHLORIDE 0.9 % IJ SOLN: 3.0000 mL | INTRAMUSCULAR | Status: DC | PRN

## 2011-04-03 MED ORDER — DOFETILIDE 250 MCG PO CAPS
250.0000 ug | ORAL_CAPSULE | Freq: Two times a day (BID) | ORAL | Status: DC
  Administered 2011-04-03 – 2011-04-07 (×8): 250 ug via ORAL
  Filled 2011-04-03 (×9): qty 1

## 2011-04-03 NOTE — Telephone Encounter (Signed)
Agree with ER eval.

## 2011-04-03 NOTE — ED Notes (Signed)
[id number] Ready

## 2011-04-03 NOTE — Progress Notes (Signed)
Patient arrived to 21 from emergency department. MD paged upon patient arrival for assessment and placement of orders. Patient vitals and weight taken. Telemetry monitor on. Oriented to the unit and the room. Will continue to monitor.

## 2011-04-03 NOTE — Progress Notes (Signed)
MD on call notified of patients urine output and color. Patient stated this was the first time he urinated today with only 253mL of output and a dark brown color. MD notified and orders given for gentel hydration through the night. (see MAR) will continue to monitor the patient

## 2011-04-03 NOTE — H&P (Signed)
Date: 04/03/2011                  Patient Name:  Corey Palmer  MRN: [id number]  DOB: 25-Jan-1971  Age / Sex: 41 y.o., male   PCP: Pryor Ochoa, MD, MD                 Medical Service: Internal Medicine Teaching Service                 Attending Physician: Dr. Milta Deiters     First Contact: Dr. Doug Sou  Pager: [phone]  Second Contact: Dr. Leonia Reeves  Pager: [phone]              After Hours (After 5pm / weekends / holidays): First Contact  Pager: [phone]   Second Contact  Pager: [phone]    Chief Complaint: Hematemesis  History of Present Illness: Pt is a 41 yo M with a h/o dilated cardiomyopathy (EF 20-25% in 2012) s/p AICD, HTN, PAF on coumadin, and DMII who presents to [facility] for evaluation of 3 day history of nausea and vomiting. Pt indicates 10-15 episodes of vomiting daily over this time frame, initially containing food contents. However, on day of admission pt noted gross blood in his vomitus. Pt also experienced watery diarrhea that began on the day prior to admission - however, on the day of admission, noticed darkened color of stools, black color with some blood tinging the toilet. Also confirms chills.   Additionally, pt confirms sharp epigastric pain, and low chest pain that has been intermittent since onset - cannot identify specific precipitating factors. Confirms shortness of breath. No recent sick contacts, no recent new foods, no travel to new places.   Past Medical History  Diagnosis Date  . Dilated cardiomyopathy     status post ICD placement in 2008 c/b tearing at the atrial junction resulting in tamponade and urgent thoracotomy  . Gout, unspecified   . Dyslipidemia   . Hypertension   . Diabetes uncomplicated adult-type II     on insulin therapy, a1c 12.9 in 01/2011  . Chronic systolic heart failure     2D echo (123456) - Systolic function severely reduced., LV EF 20-25%. Akinesis of apical and anteroseptal mycoardium.  last 2D echo (11/2008 ), LV EF  25-30%, diffuse hypokinesis, and grade 1 diastolic dysfunction.  Marland Kitchen CAD (coronary artery disease)     Cardiac cath (08/19/2000) - Nonobstructive, 40% stenosis of LAD,.   . Paroxysmal atrial fibrillation     on chronic coumadin, goal INR 2-3. status post direct current    Past Surgical History  Procedure Date  . Icd placement   . Cardiac pacemaker placement   . Left total knee replacement   . Cholecystectomy    Outpatient Meds: Medication Sig  colchicine 0.6 MG tablet Take 0.6 mg by mouth as needed. For gout flare ups  digoxin (LANOXIN) 0.25 MG tablet Take 1 tablet (250 mcg total) by mouth daily.  dofetilide (TIKOSYN) 250 MCG capsule Take 250 mcg by mouth 2 (two) times daily.  furosemide (LASIX) 40 MG tablet Take 1 tablet (40 mg total) by mouth 2 (two) times daily.  insulin aspart (NOVOLOG) 100 UNIT/ML injection Inject 5 Units into the skin 3 (three) times daily before meals.  insulin glargine (LANTUS) 100 UNIT/ML injection Inject 15 Units into the skin at bedtime.  lisinopril (PRINIVIL,ZESTRIL) 10 MG tablet Take 1 tablet (10 mg total) by mouth daily.  metoprolol succinate (TOPROL XL) 100 MG 24  hr tablet Take 1 tablet (100 mg total) by mouth daily. Take with or immediately following a meal.  nitroGLYCERIN (NITROSTAT) 0.4 MG SL tablet Place 1 tablet (0.4 mg total) under the tongue every 5 (five) minutes x 3 doses as needed for chest pain.  warfarin (COUMADIN) 10 MG tablet Take 10-20 mg by mouth daily. Take 2 tablets on tuesdays and thursdays. Take 1 tablet the rest of the week.  Insulin Pen Needle (B-D ULTRAFINE III SHORT PEN) 31G X 8 MM MISC Use to inject insulin 4 times a day    Allergies: Allergen Reactions  . Penicillins Anaphylaxis    Family Hx: Family History  Problem Relation Age of Onset  . Diabetes Mother   . Coronary artery disease Mother 48    Died of MI at 57  . Diabetes Father   . Coronary artery disease Father 24    Died in a house fire   . Gout Mother      Social Hx: History   Social History  . Marital Status: Married    Spouse Name: N/A    Number of Children: 3  . Years of Education: college   Occupational History  . bar tender    Social History Main Topics  . Smoking status: Never Smoker   . Smokeless tobacco: Not on file  . Alcohol Use: No  . Drug Use: No  . Sexually Active: Not on file   Other Topics Concern  . Not on file   Social History Narrative   Lives in [place].Studied sports medicine in college, got a BS - used to work with the [employer].    Review of Systems: As per HPI.  Physical Exam: Filed Vitals:   04/03/11 1730 04/03/11 1815 04/03/11 1816 04/03/11 1900  BP: 131/98 134/93 134/93 133/84  Pulse: 102 106 108 101  Temp:      TempSrc:      Resp: 19 18 20 15   Height:      Weight:      SpO2: 97% 99% 99% 99%   Lying:123/85, 102 Sitting: 127/90, 105 Standing: 126/88, 110 GEN: No apparent distress.  Alert and oriented x 3.  Pleasant, conversant, and cooperative to exam. HEENT: head is autraumatic and normocephalic.   EOMI.  PERRLA.  Sclerae anicteric.  Conjunctivae without pallor or injection. Mucous membranes are moist.   RESP:  Lungs are clear to ascultation bilaterally with good air movement.  No wheezes, ronchi, or rubs. CARDIOVASCULAR: regular rate, normal rhythm.  Clear S1, S2, no murmur appreciated ABDOMEN: soft, diffuse TTP in upper quadrants and epigastric region.  No distended, obese.  Bowels sounds present in all quadrants and normoactive.  No palpable masses. EXT: warm and dry.  1+ pitting edema to b/l mid calf. SKIN: warm and dry with normal turgor.  No rashes or abnormal lesions observed. DRE: normal appearing stool in vault, no visible hemerrhoids NEURO: communicating w/o difficulty, moving all four limbs freely  Lab results: Basic Metabolic Panel:  Basename 04/03/11 1526  NA 133*  K 3.5  CL 95*  CO2 27  GLUCOSE 277*  BUN 13  CREATININE 0.85  CALCIUM 8.9  MG --  PHOS --    Liver Function Tests:  Basename 04/03/11 1526  AST 21  ALT 24  ALKPHOS 79  BILITOT 0.7  PROT 7.0  ALBUMIN 3.0*    Basename 04/03/11 1526  LIPASE 28  AMYLASE --    CBC:    Component Value Date/Time   WBC 7.1 04/03/2011  2112   HGB 15.4 04/03/2011 2112   HCT 46.6 04/03/2011 2112   PLT 226 04/03/2011 2112   MCV 82.2 04/03/2011 2112   NEUTROABS 5.4 04/03/2011 1526   LYMPHSABS 1.3 04/03/2011 1526   MONOABS 0.8 04/03/2011 1526   EOSABS 0.1 04/03/2011 1526   BASOSABS 0.0 04/03/2011 1526    Cardiac Enzymes: Troponin (Point of Care Test) Basename 04/03/11 1539  TROPIPOC 0.00   FOBT: positive  Coagulation:  Basename 04/03/11 1526  LABPROT 23.3*  INR 2.03*    Imaging results:  Dg Abd Acute W/chest  04/03/2011  *RADIOLOGY REPORT*  Clinical Data: Epigastric pain, nausea and vomiting.  ACUTE ABDOMEN SERIES (ABDOMEN 2 VIEW & CHEST 1 VIEW)  Comparison: No priors.  Findings: Lung volumes are normal.  No consolidative airspace disease.  No pleural effusions.  Pulmonary vascular congestion without Micah pulmonary edema.  Moderate cardiomegaly.  Mediastinal contours are unremarkable.  The patient is status post median sternotomy.  A single lead pacemaker / AICD is in place with lead tip projecting over the expected location of the right ventricular apex.  Supine and upright views of the abdomen demonstrate gas and stool scattered throughout the colon extending to the distal rectum.  No pathologic distension of small bowel is identified.  A single loop of borderline dilated small bowel (up to 2.9 cm in diameter) is noted in the left upper quadrant of the abdomen.  No gross evidence of pneumoperitoneum.  Surgical clips projecting over the right upper quadrant of the abdomen, likely from prior cholecystectomy.  IMPRESSION: 1.  Nonspecific, nonobstructive bowel gas pattern, as above. 2.  No pneumoperitoneum. 3.  Status post cholecystectomy. 4.  Moderate cardiomegaly with pulmonary venous congestion, but without  Otto pulmonary edema. 5.  Postoperative changes and support apparatus, as above.  Original Report Authenticated By: Etheleen Mayhew, M.D.    Other results: EKG: NSR at 99, TWI in V5/V6 similar to prior, poss ST-depressions in V5-6 also similar to prior.   Assessment & Plan by Problem: Pt is a 41 yo Male with PMHX of nonischemic cardiomyopathy, chronic systolic heart failure (LV EF 25-30%), diabetes mellitus, who was admitted to [facility] on 04/03/2011 for evaluation of hematemesis.   # Hematemesis Pt describes 1 cup hematemesis today after two days of 10-15 episodes of vomiting.  Also describes melena today with some blood tinging the toilet.  Vitals stable, orthostatics negative, Hb stable.  DRE unremarkable for overt melena or blood, but FOBT positive.  Pt is on coumadin with INR of 2.  No history of PUD, GERD, or gastritis.  Nondrinker and nonsmoker.  Ddx includes mallory weiss 2/2 significant vomiting from possible gastroenteritis.  Alternatively, blood could have been initial irritant causing vomiting, though cause is unknown.  Lastly, could be cardiac origin, though less likely given negative troponin and reassuring EKG.  Other than coumadin, no known source of bleeding.  Pt is stable currently but may require EGD to find source given need for coumadin in future. - gentle IVF (75/hr x 10 hrs) given low EF - no need for blood at this time - protonix 40mg  IV bid - zofran 4mg  PRN - morphine 2mg  PRN - CBC q8h - clear liquid diet - cycle cardiac enzymes - consider GI consult - hold coumadin, no need to reverse  # Diabetes mellitus, type 2 Last A1c 12.9 in 01/2011 - patient currently on Novolin 70/30 at home. However, in setting of potential GI bleed, and reduced oral intake, will reduce home insulin regimen. -  Lantus 10 units daily - SSI TID and qHS  # Paroxysmal atrial fibrillation, on chronic coumadin therapy Coumadin is therapeutic today. Is rate controlled currently on home regimen. -  CBC q8h overnight - consider restarting coumadin as clinical status allows   # Chronic systolic heart failure, NYHA class II, without acute exacerbation Last echo in 2012 showing EF 20-25%. Currently at his baseline weight, no evidence of volume overload.  CXR indicates venous congestion but no edema. - holding lasix given reduced PO intake, but would consider restarting soon (poss tomorrow) - con't tikosyn 224mcg - con't digoxin 250 mcg - metoprolol XL 100mg  - daily weights  # DVT PPx - SCDs

## 2011-04-03 NOTE — ED Notes (Signed)
Attempted to call report to [phone] and they said they had not assigned the bed and they will call back

## 2011-04-03 NOTE — Telephone Encounter (Signed)
Pt called and states last 3 days he has had vomiting 7 - 10 times a day.  He is not able to keep anything down.  Today he notes bright red blood when he vomits and states his stools are black.  He does report dizziness. He is not alone, wife at home. Pt advised ED immediately.  He feels he can go now now and does not want to call EMS.

## 2011-04-03 NOTE — ED Notes (Signed)
Pt also reports after vomiting, his chest began to hurt, reports he could feel his pacemaker "kick in", took NTG x 2 that relieved his pain "because I knew if I didn't my defibrillator would kick in".

## 2011-04-03 NOTE — ED Provider Notes (Signed)
History     CSN: [id number]  Arrival date & time 04/03/11  1453   First MD Initiated Contact with Patient 04/03/11 1519      Chief Complaint  Patient presents with  . Emesis    (Consider location/radiation/quality/duration/timing/severity/associated sxs/prior treatment) Patient is a 41 y.o. male presenting with vomiting. The history is provided by the patient.  Emesis    patient presents with three-day history of epigastric pain radiating to his back. Symptoms are made better or worse with nothing. He has had nausea vomiting with some bloody emesis. Black stools with with liquid diarrhea noted. No fever or urinary symptoms noted. Patient took 2 nitroglycerin which did help his symptoms. Patient called his primary care Dr. was told to come in for evaluation  Past Medical History  Diagnosis Date  . Dilated cardiomyopathy     status post ICD placement in 2008  . Gout, unspecified   . Dyslipidemia   . Hypertension   . Diabetes uncomplicated adult-type II   . Heart failure   . CAD (coronary artery disease)     Past Surgical History  Procedure Date  . Icd placement   . Cardiac pacemaker placement     Family History  Problem Relation Age of Onset  . Diabetes Mother   . Coronary artery disease Mother   . Diabetes Father   . Coronary artery disease Father     History  Substance Use Topics  . Smoking status: Never Smoker   . Smokeless tobacco: Not on file  . Alcohol Use: No      Review of Systems  Gastrointestinal: Positive for vomiting.  All other systems reviewed and are negative.    Allergies  Penicillins  Home Medications   Current Outpatient Rx  Name Route Sig Dispense Refill  . COLCHICINE 0.6 MG PO TABS Oral Take 0.6 mg by mouth as needed. For gout flare ups    . DIGOXIN 0.25 MG PO TABS Oral Take 1 tablet (250 mcg total) by mouth daily. 30 tablet 6  . DOFETILIDE 250 MCG PO CAPS Oral Take 250 mcg by mouth 2 (two) times daily.    . FUROSEMIDE 40 MG PO  TABS Oral Take 1 tablet (40 mg total) by mouth 2 (two) times daily. 60 tablet 6  . INSULIN ASPART 100 UNIT/ML [place] SOLN Subcutaneous Inject 5 Units into the skin 3 (three) times daily before meals.    . INSULIN GLARGINE 100 UNIT/ML [place]yl SOLN Subcutaneous Inject 15 Units into the skin at bedtime.    Marland Kitchen LISINOPRIL 10 MG PO TABS Oral Take 1 tablet (10 mg total) by mouth daily. 30 tablet 6  . METOPROLOL SUCCINATE ER 100 MG PO TB24 Oral Take 1 tablet (100 mg total) by mouth daily. Take with or immediately following a meal. 30 tablet 6  . NITROGLYCERIN 0.4 MG SL SUBL Sublingual Place 1 tablet (0.4 mg total) under the tongue every 5 (five) minutes x 3 doses as needed for chest pain. 30 tablet 3  . WARFARIN SODIUM 10 MG PO TABS Oral Take 10-20 mg by mouth daily. Take 2 tablets on tuesdays and thursdays. Take 1 tablet the rest of the week.    . INSULIN PEN NEEDLE 31G X 8 MM MISC  Use to inject insulin 4 times a day 200 each 5    BP 167/121  Pulse 112  Temp(Src) 97.3 F (36.3 C) (Oral)  Resp 20  Ht 6\' 3"  (1.905 m)  Wt 267 lb (121.11 kg)  BMI  33.37 kg/m2  SpO2 98%  Physical Exam  Nursing note and vitals reviewed. Constitutional: He is oriented to person, place, and time. He appears well-developed and well-nourished.  Non-toxic appearance. No distress.  HENT:  Head: Normocephalic and atraumatic.  Eyes: Conjunctivae, EOM and lids are normal. Pupils are equal, round, and reactive to light.  Neck: Normal range of motion. Neck supple. No tracheal deviation present. No mass present.  Cardiovascular: Regular rhythm and normal heart sounds.  Tachycardia present.  Exam reveals no gallop.   No murmur heard. Pulmonary/Chest: Effort normal and breath sounds normal. No stridor. No respiratory distress. He has no decreased breath sounds. He has no wheezes. He has no rhonchi. He has no rales.  Abdominal: Soft. Normal appearance and bowel sounds are normal. He exhibits no distension. There is tenderness in the  epigastric area. There is no rigidity, no rebound, no guarding and no CVA tenderness.  Musculoskeletal: Normal range of motion. He exhibits no edema and no tenderness.  Neurological: He is alert and oriented to person, place, and time. He has normal strength. No cranial nerve deficit or sensory deficit. GCS eye subscore is 4. GCS verbal subscore is 5. GCS motor subscore is 6.  Skin: Skin is warm and dry. No abrasion and no rash noted.  Psychiatric: He has a normal mood and affect. His speech is normal and behavior is normal.    ED Course  Procedures (including critical care time)   Labs Reviewed  CBC  DIFFERENTIAL  COMPREHENSIVE METABOLIC PANEL  LIPASE, BLOOD  PROTIME-INR  DIGOXIN LEVEL   No results found.   No diagnosis found.    MDM  Pt given pain meds and fluids, will be admitted by tsb for eval of gi bleed, no concern for acs        Leota Jacobsen, MD 04/03/11 1645

## 2011-04-03 NOTE — ED Notes (Signed)
Pt presents with 3 day h/o epigastric pain, nausea, vomiting and diarrhea.  Pt reports he can't keep liquids down.  Today, pt noted bright red blood in emesis and black in his stools.

## 2011-04-04 ENCOUNTER — Other Ambulatory Visit: Payer: Self-pay

## 2011-04-04 ENCOUNTER — Encounter (HOSPITAL_COMMUNITY): Payer: Self-pay | Admitting: Physician Assistant

## 2011-04-04 DIAGNOSIS — K920 Hematemesis: Secondary | ICD-10-CM

## 2011-04-04 DIAGNOSIS — R195 Other fecal abnormalities: Secondary | ICD-10-CM

## 2011-04-04 DIAGNOSIS — K529 Noninfective gastroenteritis and colitis, unspecified: Secondary | ICD-10-CM

## 2011-04-04 DIAGNOSIS — K5289 Other specified noninfective gastroenteritis and colitis: Secondary | ICD-10-CM

## 2011-04-04 DIAGNOSIS — Z7901 Long term (current) use of anticoagulants: Secondary | ICD-10-CM

## 2011-04-04 LAB — CBC
HCT: 43.6 % (ref 39.0–52.0)
HCT: 44.9 % (ref 39.0–52.0)
Hemoglobin: 15.2 g/dL (ref 13.0–17.0)
MCH: 27.3 pg (ref 26.0–34.0)
MCH: 27.9 pg (ref 26.0–34.0)
MCHC: 33.7 g/dL (ref 30.0–36.0)
MCHC: 33 g/dL (ref 30.0–36.0)
MCHC: 33.9 g/dL (ref 30.0–36.0)
MCV: 81.6 fL (ref 78.0–100.0)
MCV: 82.7 fL (ref 78.0–100.0)
MCV: 82.4 fL (ref 78.0–100.0)
Platelets: 205 10*3/uL (ref 150–400)
RBC: 5.45 MIL/uL (ref 4.22–5.81)
RDW: 13.7 % (ref 11.5–15.5)
RDW: 13.7 % (ref 11.5–15.5)
WBC: 8.2 10*3/uL (ref 4.0–10.5)

## 2011-04-04 LAB — BASIC METABOLIC PANEL
BUN: 15 mg/dL (ref 6–23)
Calcium: 8.6 mg/dL (ref 8.4–10.5)
Creatinine, Ser: 0.96 mg/dL (ref 0.50–1.35)
GFR calc Af Amer: >90 mL/min (ref >90)
GFR calc non Af Amer: >90 mL/min (ref >90)
Glucose, Bld: 188 mg/dL — ABNORMAL HIGH (ref 70–99)
Potassium: 3.7 mEq/L (ref 3.5–5.1)

## 2011-04-04 LAB — GLUCOSE, CAPILLARY: Glucose-Capillary: 176 mg/dL — ABNORMAL HIGH (ref 70–99)

## 2011-04-04 LAB — CARDIAC PANEL(CRET KIN+CKTOT+MB+TROPI)
CK, MB: 4.1 ng/mL — ABNORMAL HIGH (ref 0.3–4.0)
Relative Index: 3.9 — ABNORMAL HIGH (ref 0.0–2.5)
Total CK: 91 U/L (ref 7–232)
Troponin I: <0.3 ng/mL (ref <0.30)
Troponin I: <0.3 ng/mL (ref <0.30)

## 2011-04-04 MED ORDER — WARFARIN SODIUM 10 MG PO TABS
10.0000 mg | ORAL_TABLET | Freq: Once | ORAL | Status: DC
  Filled 2011-04-04: qty 1

## 2011-04-04 MED ORDER — ATORVASTATIN CALCIUM 20 MG PO TABS
20.0000 mg | ORAL_TABLET | Freq: Every day | ORAL | Status: DC
  Administered 2011-04-05 – 2011-04-06 (×2): 20 mg via ORAL
  Filled 2011-04-04 (×4): qty 1

## 2011-04-04 MED ORDER — WARFARIN - PHARMACIST DOSING INPATIENT: Freq: Every day | Status: DC

## 2011-04-04 MED ORDER — PROMETHAZINE HCL 25 MG/ML IJ SOLN
25.0000 mg | Freq: Four times a day (QID) | INTRAMUSCULAR | Status: DC | PRN
  Administered 2011-04-04 – 2011-04-05 (×2): 25 mg via INTRAVENOUS
  Filled 2011-04-04 (×2): qty 1

## 2011-04-04 MED ORDER — POTASSIUM CHLORIDE CRYS ER 20 MEQ PO TBCR
40.0000 meq | EXTENDED_RELEASE_TABLET | Freq: Two times a day (BID) | ORAL | Status: AC
  Administered 2011-04-04 – 2011-04-05 (×3): 40 meq via ORAL
  Filled 2011-04-04 (×3): qty 2

## 2011-04-04 MED ORDER — FUROSEMIDE 40 MG PO TABS
40.0000 mg | ORAL_TABLET | Freq: Every day | ORAL | Status: DC
  Administered 2011-04-04 – 2011-04-07 (×4): 40 mg via ORAL
  Filled 2011-04-04 (×4): qty 1

## 2011-04-04 NOTE — Progress Notes (Signed)
Paged Internal med - notified MD of Pts severe nausea and dry heaves. Orders received. Delray Alt RN

## 2011-04-04 NOTE — Consult Note (Addendum)
[facility] Consult: 2:11 PM 04/04/2011   Referring Provider: Milta Deiters Primary Care Physician:  Pryor Ochoa, MD Primary Gastroenterologist:  None found.     Reason for Consultation:  FOB positive, hematemesis  HPI: Corey Palmer is a 41 y.o. male. Patient has nonischemic dilated cardiomyopathy and is status post placement of ICD/pacemaker device.  He is on chronic Coumadin for paroxysmal atrial fibrillation. He is a type II diabetic Patient was admitted to the hospital yesterday morning. He presented to the emergency room with three-day history of unrelenting nausea vomiting, 10-15 episodes daily, along with multiple watery stools. Patient had rigors and could not get warm. He had epigastric discomfort that radiated to the lower sternum.. Initially vomit was partially digested gastric contents, that it was just bilious but not coffee ground like. Yesterday morning for the first time he threw up red blood. At this point his stools turn for brown watery to black watery and he could see red blood leaching from the stool in the commode.  Patient had chills.   Patient's hemoglobin has gone from 15.4 yesterday to 14.4 this afternoon. His INR is 2.0 Patient's blood sugar scored 277.  BUN and creatinine as well as LFTs are normal  Under normal prescription circumstances the patient has a "cast iron" stomach. He is not suffer from acid reflux, nausea, anorexia, dysphagia or abdominal pain. He does not require the use of any antacids, PPIs or H2 blockers. He does take a low-dose aspirin daily but no nonsteroidal anti-inflammatory medications. Bowel movements are normally brown and formed, occurring daily.  In reviewing prior imaging and ultrasound from January 2011 is found which shows hepatic steatosis. A CT scan from February 2010 mentions resolved periepatic ascites as well as small fat-containing bilateral inguinal hernias. Patient has had no contact with  close associates who has gastrointestinal illness similar to his. 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 Notably the patient does not drink alcohol. He says his Coumadin doses have been stable for a long time. He has not been treated with antibiotics in the near or long-term past.   Past Medical History  Diagnosis Date  . Dilated cardiomyopathy     status post ICD placement in 2008 c/b tearing at the atrial junction resulting in tamponade and urgent thoracotomy  . Gout, unspecified   . Dyslipidemia   . Hypertension   . Diabetes uncomplicated adult-type II     on insulin therapy, a1c 12.9 in 01/2011  . Chronic systolic heart failure     2D echo (123456) - Systolic function severely reduced., LV EF 20-25%. Akinesis of apical and anteroseptal mycoardium.  last 2D echo (11/2008 ), LV EF 25-30%, diffuse hypokinesis, and grade 1 diastolic dysfunction.  Marland Kitchen CAD (coronary artery disease)     Cardiac cath (08/19/2000) - Nonobstructive, 40% stenosis of LAD,.   . Paroxysmal atrial fibrillation     on chronic coumadin, goal INR 2-3. status post direct current    Past Surgical History  Procedure Date  . Icd placement   . Cardiac pacemaker placement   . Left total knee replacement   . Cholecystectomy     Prior to Admission medications   Medication Sig Start Date End Date Taking? Authorizing Provider  colchicine 0.6 MG tablet Take 0.6 mg by mouth as needed. For gout flare ups   Yes Historical Provider, MD  digoxin (LANOXIN) 0.25 MG tablet Take 1 tablet (250 mcg  total) by mouth daily. 02/20/11  Yes Ivor Costa, MD  dofetilide (TIKOSYN) 250 MCG capsule Take 250 mcg by mouth 2 (two) times daily.   Yes Historical Provider, MD  furosemide (LASIX) 40 MG tablet Take 1 tablet (40 mg total) by mouth 2 (two) times daily. 02/20/11 02/20/12 Yes Ivor Costa, MD  insulin aspart (NOVOLOG) 100 UNIT/ML injection Inject 5 Units into the skin 3 (three) times daily  before meals.   Yes Historical Provider, MD  insulin glargine (LANTUS) 100 UNIT/ML injection Inject 15 Units into the skin at bedtime.   Yes Historical Provider, MD  Insulin Pen Needle (B-D ULTRAFINE III SHORT PEN) 31G X 8 MM MISC Use to inject insulin 4 times a day 02/20/11  Yes Ivor Costa, MD  lisinopril (PRINIVIL,ZESTRIL) 10 MG tablet Take 1 tablet (10 mg total) by mouth daily. 02/20/11 02/20/12 Yes Ivor Costa, MD  metoprolol succinate (TOPROL XL) 100 MG 24 hr tablet Take 1 tablet (100 mg total) by mouth daily. Take with or immediately following a meal. 02/20/11 02/20/12 Yes Ivor Costa, MD  nitroGLYCERIN (NITROSTAT) 0.4 MG SL tablet Place 1 tablet (0.4 mg total) under the tongue every 5 (five) minutes x 3 doses as needed for chest pain. 02/20/11 02/20/12 Yes Ivor Costa, MD  warfarin (COUMADIN) 10 MG tablet Take 10-20 mg by mouth daily. Take 2 tablets on tuesdays and thursdays. Take 1 tablet the rest of the week.   Yes Historical Provider, MD    Scheduled Meds:    . sodium chloride   Intravenous STAT  . digoxin  250 mcg Oral Daily  . dofetilide  250 mcg Oral BID  . furosemide  40 mg Oral Daily  . HYDROmorphone      . HYDROmorphone      . insulin aspart  0-15 Units Subcutaneous TID WC  . insulin glargine  10 Units Subcutaneous QHS  . lisinopril  10 mg Oral Daily  . metoprolol succinate  100 mg Oral Daily  . ondansetron  4 mg Intravenous Once  . pantoprazole (PROTONIX) IV  40 mg Intravenous Q12H  . potassium chloride  40 mEq Oral BID  . sodium chloride  1,000 mL Intravenous Once  . sodium chloride  3 mL Intravenous Q12H  . DISCONTD: sodium chloride   Intravenous Once  . DISCONTD: furosemide  40 mg Oral BID  . DISCONTD:  morphine injection  4 mg Intravenous Once  . DISCONTD: sodium chloride  3 mL Intravenous Q12H  . DISCONTD: warfarin  10 mg Oral ONCE-1800  . DISCONTD: Warfarin - Pharmacist Dosing Inpatient   Does not apply q1800   Infusions:    . sodium chloride 75 mL/hr at 04/03/11 2306    PRN Meds: sodium chloride, acetaminophen, morphine injection, nitroGLYCERIN, ondansetron, sodium chloride, DISCONTD: nitroGLYCERIN   Allergies as of 04/03/2011 - Review Complete 04/03/2011  Allergen Reaction Noted  . Penicillins Anaphylaxis 11/10/2008  . Orange fruit  04/03/2011    Family History  Problem Relation Age of Onset  . Diabetes Mother   . Coronary artery disease Mother 69    Died of MI at 12  . Diabetes Father   . Coronary artery disease Father 16    Died in a house fire   . Gout Mother     History   Social History  . Marital Status: Married    Spouse Name: N/A    Number of Children: 3  . Years of Education: college   Occupational History  . bar tender  Social History Main Topics  . Smoking status: Never Smoker   . Smokeless tobacco: Not on file  . Alcohol Use: No  . Drug Use: No  . Sexually Active: Not on file   Other Topics Concern  . Not on file   Social History Narrative   Lives in [place].Studied sports medicine in college, got a BS - used to work with the [employer].    REVIEW OF SYSTEMS: Constitutional:  No weight fluctuation. No weakness prior to onset of recent illness. ENT:  Had a nose bleed this morning but not previously. Pulm:  Denies hemoptysis or cough. No dyspnea CV:  The last time his ICD fired, it fired 4 times this was about a month ago GU:  No hematuria, no dysuria no frequency. GI:  See history of present illness Heme:  No history of anemia or requirements for iron supplementation..    Transfusions:  Has never been transfused with blood products Neuro:  No Headaches, no gait imbalance, no paresthesias. Derm:  Since he was a child he's had eczema on his lower legs knee area. No ulcers or sores, no pruritus Endocrine:  No excessive urination or thirst. Immunization:  Patient received his flu vaccine in February of 2013 Pneumovax in 2011. Travel:  none   PHYSICAL EXAM:  Temp:  [97.3 F (36.3 C)-98.1 F (36.7 C)]  97.7 F (36.5 C) (03/07 0556) Pulse Rate:  [88-112] 88  (03/07 1038) Resp:  [15-20] 18  (03/07 0556) BP: (103-167)/(81-121) 119/82 mmHg (03/07 1037) SpO2:  [96 %-99 %] 96 % (03/07 0556) Weight:  [265 lb 1.6 oz (120.249 kg)-267 lb (121.11 kg)] 266 lb (120.657 kg) (03/07 0556)  General: obese, well-appearing white male in no distress. Head:  Atraumatic, normocephalic  Eyes:  No conjunctival pallor or icterus. Ears:  Not hard of hearing.  Nose:  No discharge. A small amount of red blood is seen on tissue patient had been using to stem the flow of nasal bleeding. Mouth:  Good dentition. Moist, clear oral mucosa Neck:  No JVD, no bruits Lungs: no increased respiratory effort or distress. No cough. Clear to auscultation and percussion bilaterally. Heart: regular rate and rhythm. No murmurs rubs or gallops. Abdomen:  Soft, obese, slight epigastric tenderness but no guarding or rebound. Active bowel sounds. Liver edge is palpable about 5 cm below the costal margin. Spleen is not appreciated.  Rectal: did not repeat the rectal exam this was done earlier today and at bedside testing stool was fecal occult blood positive.  Musc/Skeltl: no joint deformity or swelling. Extremities:  No pedal edema.  Neurologic:  No tremor. Moves all 4 limbs easily. Alert and oriented x3. No gross deficits observed. Skin:  No rash, itching, sores, telangiectasia. No palmar erythema Tattoos:  None seen Nodes:  No cervical adenopathy.   Psych:  Pleasant, not depressed, not agitated. He is cooperative  Intake/Output from previous day: 03/06 0701 - 03/07 0700 In: 2352.5 [P.O.:520; I.V.:1822.5; IV Piggyback:10] Out: 225 [Urine:225] Intake/Output this shift: Total I/O In: -  Out: 300 [Urine:300]  LAB RESULTS:  Basename 04/04/11 1318 04/04/11 0242 04/03/11 2112  WBC 7.5 8.2 7.1  HGB 14.4 14.8 15.4  HCT 43.6 43.9 46.6  PLT 217 205 226   BMET Lab Results  Component Value Date   NA 138 04/04/2011   NA 133*  04/03/2011   NA 136 02/19/2011   K 3.7 04/04/2011   K 3.5 04/03/2011   K 4.1 02/19/2011   CL 98 04/04/2011  CL 95* 04/03/2011   CL 101 02/19/2011   CO2 31 04/04/2011   CO2 27 04/03/2011   CO2 27 02/19/2011   GLUCOSE 188* 04/04/2011   GLUCOSE 277* 04/03/2011   GLUCOSE 249* 02/19/2011   BUN 15 04/04/2011   BUN 13 04/03/2011   BUN 18 02/19/2011   CREATININE 0.96 04/04/2011   CREATININE 0.85 04/03/2011   CREATININE 0.80 02/19/2011   CALCIUM 8.6 04/04/2011   CALCIUM 8.9 04/03/2011   CALCIUM 8.9 02/19/2011   LFT  Basename 04/03/11 1526  PROT 7.0  ALBUMIN 3.0*  AST 21  ALT 24  ALKPHOS 79  BILITOT 0.7  BILIDIR --  IBILI --   PT/INR Lab Results  Component Value Date   INR 2.03* 04/03/2011   INR 1.90 03/18/2011   INR 2.20 03/04/2011   Hepatitis Panel No results found for this basename: HEPBSAG,HCVAB,HEPAIGM,HEPBIGM in the last 72 hours C-Diff No components found with this basename: cdiff    Drugs of Abuse     Component Value Date/Time   LABOPIA NONE DETECTED 03/08/2008 0900   COCAINSCRNUR NONE DETECTED 03/08/2008 0900   LABBENZ NONE DETECTED 03/08/2008 0900   AMPHETMU NONE DETECTED 03/08/2008 0900   THCU NONE DETECTED 03/08/2008 0900   LABBARB  Value: NONE DETECTED        DRUG SCREEN FOR MEDICAL PURPOSES ONLY.  IF CONFIRMATION IS NEEDED FOR ANY PURPOSE, NOTIFY LAB WITHIN 5 DAYS.        LOWEST DETECTABLE LIMITS FOR URINE DRUG SCREEN Drug Class       Cutoff (ng/mL) Amphetamine      1000 Barbiturate      200 Benzodiazepine   [id number] Tricyclics       [id number] Opiates          300 Cocaine          300 THC              50 03/08/2008 0900     RADIOLOGY STUDIES: Dg Abd Acute W/chest  04/03/2011  *RADIOLOGY REPORT*  Clinical Data: Epigastric pain, nausea and vomiting.  ACUTE ABDOMEN SERIES (ABDOMEN 2 VIEW & CHEST 1 VIEW)  Comparison: No priors.  Findings: Lung volumes are normal.  No consolidative airspace disease.  No pleural effusions.  Pulmonary vascular congestion without Devyn pulmonary edema.  Moderate cardiomegaly.  Mediastinal  contours are unremarkable.  The patient is status post median sternotomy.  A single lead pacemaker / AICD is in place with lead tip projecting over the expected location of the right ventricular apex.  Supine and upright views of the abdomen demonstrate gas and stool scattered throughout the colon extending to the distal rectum.  No pathologic distension of small bowel is identified.  A single loop of borderline dilated small bowel (up to 2.9 cm in diameter) is noted in the left upper quadrant of the abdomen.  No gross evidence of pneumoperitoneum.  Surgical clips projecting over the right upper quadrant of the abdomen, likely from prior cholecystectomy.  IMPRESSION: 1.  Nonspecific, nonobstructive bowel gas pattern, as above. 2.  No pneumoperitoneum. 3.  Status post cholecystectomy. 4.  Moderate cardiomegaly with pulmonary venous congestion, but without Wilder pulmonary edema. 5.  Postoperative changes and support apparatus, as above.  Original Report Authenticated By: Etheleen Mayhew, M.D.    ENDOSCOPIC STUDIES: Colonoscopy at least 20 years ago when he was having rectal bleeding. He was told that constipation had led to some tearing of the rectum that caused the bleeding.  Colonoscopy about 10 years  ago when he has lost 100 or so pounds and was having abdominal pain. He says his study was negative.  Patient does not recall which GI group or doctor took care of him. There is no documented colonoscopies or endoscopies in Epic   IMPRESSION: 1. GI bleed. Rule out Mallory-Weiss tear. Nothing in his history suggests chronic reflux disease or ulcers. Is history of hepatic steatosis and he has hepatomegaly by abdominal exam today. This raises the question of portal hypertension leading to esophageal varices or portal hypertensive gastropathy. However the bleeding did not start until day 3 and followed incidences of innumerable episodes of nausea vomiting. 2. Nonischemic cardiomyopathy. Status post ICD.  Chronic Coumadin for atrial fibrillation. Coumadin is on hold, he has not received vitamin K or FFP to correct his coags. 3.insulin-dependent diabetes mellitus.  PLAN:  1. EGD. Will arrange for this tomorrow. In the meantime he can continue on clear liquids and twice daily intravenous Protonix. 2. Check PT INR in the morning.   LOS: 1 day   Azucena Freed  04/04/2011, 2:11 PM Pager: [phone]  GI ATTENDING  HX, LABS REVIEWED. AGREE WITH H&P AS OUTLINED. PT W/ COMPLICATED MEDICAL HX. PRESENTS WITH WHAT SOUNDS LIKE AN ACUTE VIRAL GASTROENTERITIS. ADDITIONALLY MINOR HEMATEMESIS ON COUMADIN. CURRENTLY STABLE. HEPATOMEGALY NOTED. AGREE WITH PPI, CHECK COAGS, AND EGD TOMORROW. PT IS HIGH RISK GIVEN COMORBID CONDITIONS.The nature of the procedure, as well as the risks, benefits, and alternatives were carefully and thoroughly reviewed with the patient. Ample time for discussion and questions allowed. The patient understood, was satisfied, and agreed to proceed.   Docia Chuck. Geri Seminole., M.D. [facility] of Gastroenterology     04-05-11  NO INTERVAL CHANGE. READY FOR EGD. Docia Chuck. Geri Seminole., M.D. [facility] of Gastroenterology

## 2011-04-04 NOTE — Progress Notes (Signed)
Pt continues to have nausea now emesis x 2 clear liq Had IV Zofran 4 mg @ 1648 without relief. Paged MD to notify. At 6:45 paged [facility] using pager numbers listed to make aware of pt still having severe nausea.

## 2011-04-04 NOTE — Progress Notes (Signed)
Resident Co-sign Daily Note: I have seen the patient and reviewed the daily progress note by Luster Landsberg MS 4 and discussed the care of the patient with them.  See below for documentation of my findings, assessment, and plans.  Subjective: The patient is see and has no complaints. He is feeling mostly better with no episodes of vomiting since he has been in the hospital. Still having some pain in his stomach area and has occasional dry cough. No chest pain, no nausea, no vomiting, no diarrhea. No repeat episodes of dark stool.   Objective: Vital signs in last 24 hours: Filed Vitals:   04/04/11 0226 04/04/11 0556 04/04/11 1037 04/04/11 1038  BP: 103/81 121/83 119/82   Pulse: 96 90  88  Temp: 98.1 F (36.7 C) 97.7 F (36.5 C)    TempSrc: Oral Oral    Resp: 18 18    Height:      Weight:  266 lb (120.657 kg)    SpO2: 98% 96%     Physical Exam: General: resting in bed, obese HEENT: PERRL, EOMI, no scleral icterus Cardiac: RRR, systolic murmur heard Pulm: clear to auscultation bilaterally, moving normal volumes of air Abd: soft, mildly tender epigastric area, nondistended, BS present, obese Ext: warm and well perfused, no pedal edema Neuro: alert and oriented X3, cranial nerves II-XII grossly intact  Lab Results: Reviewed and documented in Electronic Record Micro Results: Reviewed and documented in Electronic Record Studies/Results: Reviewed and documented in Electronic Record Medications: I have reviewed the patient's current medications. Scheduled Meds:   . sodium chloride   Intravenous STAT  . atorvastatin  20 mg Oral q1800  . digoxin  250 mcg Oral Daily  . dofetilide  250 mcg Oral BID  . furosemide  40 mg Oral Daily  . HYDROmorphone      . HYDROmorphone      . insulin aspart  0-15 Units Subcutaneous TID WC  . insulin glargine  10 Units Subcutaneous QHS  . lisinopril  10 mg Oral Daily  . metoprolol succinate  100 mg Oral Daily  . ondansetron  4 mg Intravenous Once  .  pantoprazole (PROTONIX) IV  40 mg Intravenous Q12H  . potassium chloride  40 mEq Oral BID  . sodium chloride  1,000 mL Intravenous Once  . sodium chloride  3 mL Intravenous Q12H  . DISCONTD: sodium chloride   Intravenous Once  . DISCONTD: furosemide  40 mg Oral BID  . DISCONTD:  morphine injection  4 mg Intravenous Once  . DISCONTD: sodium chloride  3 mL Intravenous Q12H  . DISCONTD: warfarin  10 mg Oral ONCE-1800  . DISCONTD: Warfarin - Pharmacist Dosing Inpatient   Does not apply q1800   Continuous Infusions:   . sodium chloride 75 mL/hr at 04/03/11 2306   PRN Meds:.sodium chloride, acetaminophen, morphine injection, nitroGLYCERIN, ondansetron, sodium chloride, DISCONTD: nitroGLYCERIN Assessment/Plan: Hematemesis - Currently resolved. GI consult called today and they will see and make recommendations. Still having significant pain in his abdomen.    AODM - Using SSI and lantus to control his sugars. Can adjust his scale as needed however control has been poor at home.    DYSLIPIDEMIA - Added lipitor today as recent lipid panel shows increased LDL.    Essential hypertension, benign - On BP meds, controlled here.    Atrial fibrillation - on tikosyn and giving K to keep > 4 as risk for prolonged QT is higher.    Long term current use of anticoagulant -  Holding coumadin until GI sees. Recheck Pt/INR in the morning.   Disposition - per GI recommendations.    LOS: 1 day   Vertell Novak 04/04/2011, 3:11 PM

## 2011-04-04 NOTE — Progress Notes (Signed)
Medical Student Daily Progress Note  Subjective: NAEON.  Pt has multiple episodes of sharp epigastric pain that resolves on its own after several minutes, but denies nausea/vomiting/hematemesis since admission.  He denies radiating chest pain, diaphoresis.  To further clarify his PMH, he states that he has had a colonoscopy twice in the past. The first time 15 years ago for dark tarry stools associated with excessive straining/constipation, and second time approximately 10 years ago with an EGD for intense RLQ and LLQ abdominal pain after a 75 pound weight loss (325---->250).  The EGD and both colonscopies have all been normal to date.  He has no history of hemorrhoids and no history of colon cancer in his family.  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  He has last taken his coumadin on Tuesday evening (20mg ), and is followed by [facility] for his CHF, paroxsymal afib and AICD.  Objective: Vital signs in last 24 hours: Filed Vitals:   04/04/11 0226 04/04/11 0556 04/04/11 1037 04/04/11 1038  BP: 103/81 121/83 119/82   Pulse: 96 90  88  Temp: 98.1 F (36.7 C) 97.7 F (36.5 C)    TempSrc: Oral Oral    Resp: 18 18    Height:      Weight:  120.657 kg (266 lb)    SpO2: 98% 96%     Weight change:   Intake/Output Summary (Last 24 hours) at 04/04/11 1051 Last data filed at 04/04/11 0700  Gross per 24 hour  Intake 2352.5 ml  Output    225 ml  Net 2127.5 ml   Physical Exam: Vitals Reviewed General: Cooperative obese male sitting in bed in some distress HEENT: normocephalic, no LAD appreciated.  MMM, OP clear CV: Loud S1, normal rate and rhythm. 5 inch scar midline on chest Pulmonary: CTAB, no wheezing Abdominal: exquisitely tender in the epigrastrium.  BS 2+ in all quadrants.  Diffusely tender obese abdomen in all 4 quadrants.  Extremities: 1+ pitting edema appreciated bilaterally up to calves, good pedal pulses. Neuro: Alert and oriented x  3 Psych:  Appropriate  Lab Results: Basic Metabolic Panel:  Lab [id number] 04/03/11 1526  NA 138 133*  K 3.7 3.5  CL 98 95*  CO2 31 27  GLUCOSE 188* 277*  BUN 15 13  CREATININE 0.96 0.85  CALCIUM 8.6 8.9  MG -- --  PHOS -- --   Liver Function Tests:  Lab 04/03/11 1526  AST 21  ALT 24  ALKPHOS 79  BILITOT 0.7  PROT 7.0  ALBUMIN 3.0*    Lab 04/03/11 1526  LIPASE 28  AMYLASE --   CBC:  Lab 04/04/11 0242 04/03/11 2112 04/03/11 1526  WBC 8.2 7.1 --  NEUTROABS -- -- 5.4  HGB 14.8 15.4 --  HCT 43.9 46.6 --  MCV 81.6 82.2 --  PLT 205 226 --   Cardiac Enzymes:  Lab 04/04/11 0243 04/03/11 2112  CKTOTAL 111 131  CKMB 4.3* 4.5*  CKMBINDEX -- --  TROPONINI <0.30 <0.30   CBG:  Lab 04/04/11 0554 04/03/11 2021  GLUCAP 162* 229*   Hemoglobin A1C: No results found for this basename: HGBA1C in the last 168 hours Coagulation:  Lab 04/03/11 1526  LABPROT 23.3*  INR 2.03*   Anemia Panel: No results found for this basename: VITAMINB12,FOLATE,FERRITIN,TIBC,IRON,RETICCTPCT in the last 168 hours Urine Drug Screen: Drugs of Abuse     Component Value Date/Time   LABOPIA NONE DETECTED 03/08/2008 0900   COCAINSCRNUR  NONE DETECTED 03/08/2008 0900   LABBENZ NONE DETECTED 03/08/2008 0900   AMPHETMU NONE DETECTED 03/08/2008 0900   THCU NONE DETECTED 03/08/2008 0900   LABBARB  Value: NONE DETECTED        DRUG SCREEN FOR MEDICAL PURPOSES ONLY.  IF CONFIRMATION IS NEEDED FOR ANY PURPOSE, NOTIFY LAB WITHIN 5 DAYS.        LOWEST DETECTABLE LIMITS FOR URINE DRUG SCREEN Drug Class       Cutoff (ng/mL) Amphetamine      1000 Barbiturate      200 Benzodiazepine   [id number] Tricyclics       [id number] Opiates          300 Cocaine          300 THC              50 03/08/2008 0900    Alcohol Level: No results found for this basename: ETH:2 in the last 168 hours Urinalysis: No results found for this basename:  COLORURINE:2,APPERANCEUR:2,LABSPEC:2,PHURINE:2,GLUCOSEU:2,HGBUR:2,BILIRUBINUR:2,KETONESUR:2,PROTEINUR:2,UROBILINOGEN:2,NITRITE:2,LEUKOCYTESUR:2 in the last 168 hours Misc. Labs: Hb of 12.9 in Jan 2013  Micro Results: No results found for this or any previous visit (from the past 240 hour(s)). Studies/Results: Dg Abd Acute W/chest  04/03/2011  *RADIOLOGY REPORT*  Clinical Data: Epigastric pain, nausea and vomiting.  ACUTE ABDOMEN SERIES (ABDOMEN 2 VIEW & CHEST 1 VIEW)  Comparison: No priors.  Findings: Lung volumes are normal.  No consolidative airspace disease.  No pleural effusions.  Pulmonary vascular congestion without Amato pulmonary edema.  Moderate cardiomegaly.  Mediastinal contours are unremarkable.  The patient is status post median sternotomy.  A single lead pacemaker / AICD is in place with lead tip projecting over the expected location of the right ventricular apex.  Supine and upright views of the abdomen demonstrate gas and stool scattered throughout the colon extending to the distal rectum.  No pathologic distension of small bowel is identified.  A single loop of borderline dilated small bowel (up to 2.9 cm in diameter) is noted in the left upper quadrant of the abdomen.  No gross evidence of pneumoperitoneum.  Surgical clips projecting over the right upper quadrant of the abdomen, likely from prior cholecystectomy.  IMPRESSION: 1.  Nonspecific, nonobstructive bowel gas pattern, as above. 2.  No pneumoperitoneum. 3.  Status post cholecystectomy. 4.  Moderate cardiomegaly with pulmonary venous congestion, but without Ryer pulmonary edema. 5.  Postoperative changes and support apparatus, as above.  Original Report Authenticated By: Etheleen Mayhew, M.D.   Medications: I have reviewed the patient's current medications. Scheduled Meds:   . sodium chloride   Intravenous STAT  . digoxin  250 mcg Oral Daily  . dofetilide  250 mcg Oral BID  . furosemide  40 mg Oral Daily  . HYDROmorphone       . HYDROmorphone      . insulin aspart  0-15 Units Subcutaneous TID WC  . insulin glargine  10 Units Subcutaneous QHS  . lisinopril  10 mg Oral Daily  . metoprolol succinate  100 mg Oral Daily  . ondansetron  4 mg Intravenous Once  . pantoprazole (PROTONIX) IV  40 mg Intravenous Q12H  . sodium chloride  1,000 mL Intravenous Once  . sodium chloride  3 mL Intravenous Q12H  . warfarin  10 mg Oral ONCE-1800  . Warfarin - Pharmacist Dosing Inpatient   Does not apply q1800  . DISCONTD: sodium chloride   Intravenous Once  . DISCONTD: furosemide  40 mg Oral BID  . DISCONTD:  morphine injection  4 mg Intravenous Once  . DISCONTD: sodium chloride  3 mL Intravenous Q12H   Continuous Infusions:   . sodium chloride 75 mL/hr at 04/03/11 2306   PRN Meds:.sodium chloride, acetaminophen, morphine injection, nitroGLYCERIN, ondansetron, sodium chloride, DISCONTD: nitroGLYCERIN Assessment/Plan: Principal Problem:  *Hematemesis Active Problems:  AODM  DYSLIPIDEMIA  Essential hypertension, benign  Atrial fibrillation  Chronic systolic congestive heart failure, NYHA class 2  Long term current use of anticoagulant  Mr. Kilmer is a 41 yo man who presented with a h/o 3 days of nausea, vomiting (10-15 x day) and hemetemsis.  He has not vomited since admission.  He had a positive FOBT in the ED, and his hemoglobin has dropped from 16-->14. We will consult GI since he has had episodes of intense epigastric pain that remit after 1-2 mins.  We are evaluating him for suspicion of mallory-weiss tear.  His wife, Milan Deo can be reached at [phone] and brother Gwyndolyn Saxon at [phone].  1.  Hematemesis - Given the history of present illness, particularly 1 styrofoam cup full per patient en route to hospital, a non-specific chest xray and dropping Hemoglobin, we suspect this patient may have an upper GI bleed from Ulcer vs. Mallory weiss tears vs. Mucosal disease.  He had 10-15 vomiting episode for 3 days, with  new onset of epigastric pain for 24 hours prior to admission.  He is exquisitely tender over the epigastrium and diffusely tender in all 4 abdominal quadrants.  - Hb stable at 14.8, will transfuse if drops below 8, but unlikely as there has not been hematemesis since admission.  Given 792mL over 10 hours on admission.  - FOBT positive  - Protonix 40mg  IV BID  - GI Consult with possible EGD/colonoscopy if appropriate  - Clear liquids  - Zofran prn nausea, Morphine prn pain  - CBC Q12H  2.  Uncontrolled insulin dependent DM2 - Pt has poor sugar control and is not currently taking any PO DM medications.  He takes 5 units of novolog with meals and 10 units of Lantus as night.  Does not monitor sugars or diet at home. Last A1c of 12.9 in January.  - Moderate SSI, will consider more stringent SSI if poor control on this regiment.  Monitor regular glucose per floor protocol.  - Glargine 10 units QD  3.  CHF with EF of 0000000 - Complicated cardiac history as noted in HPI with PAF, AICD, and htn.  - Digoxin 234mcg QD  - Dofetilide 271mcg BID, with maintaining K > 4.0 per pharmacy.  Will order potassium PO supplementation as needed.  4.  Paroxsymal Atrial Fibrillation - Mr. Maner has a complicated cardiac history and has an AICD in place.  Coumadin dosing at home is 20mg  T/Th and 10mg  every other day.  He also takes ASA at home.  He last took his coumadin dose was 20mg  on Tuesday evening.    - We will hold coumadin in the setting of upper GI bleed, and check his daily PT/INR.  - Patient has AICD in place   5.  Dyslipidemia - He has an LDL of 130 from his outpatient clinic visits here at [facility], but has yet to be started on a statin because of lack of follow up.  We will initiate statin therapy of Lipitor 20mg  QD with the goal of reducing his LDL to less than 100.  - Initiate Lipitor 20mg  QD  6.  Hypertension - He has a well documented history of hypertension.  His pressure has been under good control  here, 130s/90s.  We will Continue his home regimen.  - Continue Lasix 40mg  QD  - Continue Lisinopril 10mg  QD  - Continue Toprol-XL 100mg  QD  7.  Diet/Fluids - Clear liquids, NSL (768mL to date)  8.  Disposition - Pending GI consult.   LOS: 1 day   This is a Careers information officer Note.  The care of the patient was discussed with Dr. Doug Sou and the assessment and plan formulated with their assistance.  Please see their attached note for official documentation of the daily encounter.  Posey Pronto, [place] 04/04/2011, 10:51 AM

## 2011-04-04 NOTE — Progress Notes (Signed)
ANTICOAGULATION CONSULT NOTE - Initial Consult  Pharmacy Consult for coumadin Indication: atrial fibrillation  Allergies  Allergen Reactions  . Penicillins Anaphylaxis  . Orange Fruit     Patient Measurements: Height: 6\' 3"  (190.5 cm) Weight: 266 lb (120.657 kg) (scale C) IBW/kg (Calculated) : 84.5   Vital Signs: Temp: 97.7 F (36.5 C) (03/07 0556) Temp src: Oral (03/07 0556) BP: 121/83 mmHg (03/07 0556) Pulse Rate: 90  (03/07 0556)  Labs:  Basename 04/04/11 0243 04/04/11 0242 04/03/11 2112 04/03/11 1526  HGB -- 14.8 15.4 --  HCT -- 43.9 46.6 47.0  PLT -- 205 226 207  APTT -- -- -- --  LABPROT -- -- -- 23.3*  INR -- -- -- 2.03*  HEPARINUNFRC -- -- -- --  CREATININE -- 0.96 -- 0.85  CKTOTAL 111 -- 131 --  CKMB 4.3* -- 4.5* --  TROPONINI <0.30 -- <0.30 --   Estimated Creatinine Clearance: 140.4 ml/min (by C-G formula based on Cr of 0.96).  Medical History: Past Medical History  Diagnosis Date  . Dilated cardiomyopathy     status post ICD placement in 2008 c/b tearing at the atrial junction resulting in tamponade and urgent thoracotomy  . Gout, unspecified   . Dyslipidemia   . Hypertension   . Diabetes uncomplicated adult-type II     on insulin therapy, a1c 12.9 in 01/2011  . Chronic systolic heart failure     2D echo (123456) - Systolic function severely reduced., LV EF 20-25%. Akinesis of apical and anteroseptal mycoardium.  last 2D echo (11/2008 ), LV EF 25-30%, diffuse hypokinesis, and grade 1 diastolic dysfunction.  Marland Kitchen CAD (coronary artery disease)     Cardiac cath (08/19/2000) - Nonobstructive, 40% stenosis of LAD,.   . Paroxysmal atrial fibrillation     on chronic coumadin, goal INR 2-3. status post direct current    Medications:  Prescriptions prior to admission  Medication Sig Dispense Refill  . colchicine 0.6 MG tablet Take 0.6 mg by mouth as needed. For gout flare ups      . digoxin (LANOXIN) 0.25 MG tablet Take 1 tablet (250 mcg total) by mouth  daily.  30 tablet  6  . dofetilide (TIKOSYN) 250 MCG capsule Take 250 mcg by mouth 2 (two) times daily.      . furosemide (LASIX) 40 MG tablet Take 1 tablet (40 mg total) by mouth 2 (two) times daily.  60 tablet  6  . insulin aspart (NOVOLOG) 100 UNIT/ML injection Inject 5 Units into the skin 3 (three) times daily before meals.      . insulin glargine (LANTUS) 100 UNIT/ML injection Inject 15 Units into the skin at bedtime.      . Insulin Pen Needle (B-D ULTRAFINE III SHORT PEN) 31G X 8 MM MISC Use to inject insulin 4 times a day  200 each  5  . lisinopril (PRINIVIL,ZESTRIL) 10 MG tablet Take 1 tablet (10 mg total) by mouth daily.  30 tablet  6  . metoprolol succinate (TOPROL XL) 100 MG 24 hr tablet Take 1 tablet (100 mg total) by mouth daily. Take with or immediately following a meal.  30 tablet  6  . nitroGLYCERIN (NITROSTAT) 0.4 MG SL tablet Place 1 tablet (0.4 mg total) under the tongue every 5 (five) minutes x 3 doses as needed for chest pain.  30 tablet  3  . warfarin (COUMADIN) 10 MG tablet Take 10-20 mg by mouth daily. Take 2 tablets on tuesdays and thursdays. Take 1 tablet the  rest of the week.        Assessment: 41 yo male with afib on coumadin PTA (last dose taken 3/6) and INR at goal (2.03) on 3/6. He is here for hematemesis, melena and vomiting and to restart coumadin today (noted possible GI consult). Hg is stable at 14.8, plt=215K.  Goal of Therapy:  INR 2-3   Plan:  -Will give 10mg  coumadin today based on home regimen -PT/INR daily  Dareen Piano 04/04/2011,10:25 AM

## 2011-04-04 NOTE — H&P (Signed)
IM Attending  89 man admitted for episode of hematemesis after 3 days of non-bloody emesis.  Also has spells of intense epigastric pain lasting several seconds.  Emesis has remitted since admission yesterday but spasms of pain have persisted.  No prior similar sx and no prior GI disease.  No alcohol and only 81 mg ASA as mucosal toxin.  Is on warfarin for paroxysmal A fib.  Has an AICD for chronic Afib and apparant spells of activation of defibrillator.  Required surgery to replace pacer leads and had RA and SVC trauma during surgery.  Also has DM with A1c of 13. Cor regular now with nl sounds.  Vague abdominal tenderness.  Lungs clear. EKG: LAE and old non-spec STT changes. Troponin nl x 2 CXR: cardiomegaly Hgb 15.  Impression:  Story c/w Mallory-Weiss syndrome.  Cessation of emesis and near-baseline hgb may suggest less concern but he is continuing to have spells of severe pain.  Will consult GI

## 2011-04-05 ENCOUNTER — Encounter (HOSPITAL_COMMUNITY)
Admission: EM | Disposition: A | Discharge status: Home / Self Care | Payer: Self-pay | Source: Home / Self Care | Attending: Internal Medicine

## 2011-04-05 ENCOUNTER — Encounter (HOSPITAL_COMMUNITY): Payer: Self-pay

## 2011-04-05 ENCOUNTER — Encounter: Payer: Self-pay | Admitting: Internal Medicine

## 2011-04-05 DIAGNOSIS — K253 Acute gastric ulcer without hemorrhage or perforation: Secondary | ICD-10-CM

## 2011-04-05 HISTORY — PX: ESOPHAGOGASTRODUODENOSCOPY: SHX5428

## 2011-04-05 LAB — CBC
HCT: 43.5 % (ref 39.0–52.0)
Hemoglobin: 14.6 g/dL (ref 13.0–17.0)
Hemoglobin: 15.9 g/dL (ref 13.0–17.0)
MCH: 27.5 pg (ref 26.0–34.0)
MCH: 28 pg (ref 26.0–34.0)
MCHC: 33.6 g/dL (ref 30.0–36.0)
MCV: 82.1 fL (ref 78.0–100.0)
MCV: 81.7 fL (ref 78.0–100.0)
RBC: 5.3 MIL/uL (ref 4.22–5.81)
RBC: 5.67 MIL/uL (ref 4.22–5.81)
WBC: 8 10*3/uL (ref 4.0–10.5)

## 2011-04-05 LAB — GLUCOSE, CAPILLARY
Glucose-Capillary: 139 mg/dL — ABNORMAL HIGH (ref 70–99)
Glucose-Capillary: 164 mg/dL — ABNORMAL HIGH (ref 70–99)

## 2011-04-05 SURGERY — EGD (ESOPHAGOGASTRODUODENOSCOPY)
Anesthesia: Moderate Sedation

## 2011-04-05 MED ORDER — MIDAZOLAM HCL 10 MG/2ML IJ SOLN
INTRAMUSCULAR | Status: AC
  Filled 2011-04-05: qty 2

## 2011-04-05 MED ORDER — BUTAMBEN-TETRACAINE-BENZOCAINE 2-2-14 % EX AERO
INHALATION_SPRAY | CUTANEOUS | Status: DC | PRN
  Administered 2011-04-05: 2 via TOPICAL

## 2011-04-05 MED ORDER — MIDAZOLAM HCL 10 MG/2ML IJ SOLN
INTRAMUSCULAR | Status: DC | PRN
  Administered 2011-04-05 (×2): 2 mg via INTRAVENOUS

## 2011-04-05 MED ORDER — FENTANYL CITRATE 0.05 MG/ML IJ SOLN
INTRAMUSCULAR | Status: AC
  Filled 2011-04-05: qty 2

## 2011-04-05 MED ORDER — FENTANYL NICU IV SYRINGE 50 MCG/ML
INJECTION | INTRAMUSCULAR | Status: DC | PRN
  Administered 2011-04-05 (×2): 25 ug via INTRAVENOUS

## 2011-04-05 NOTE — Op Note (Signed)
[facility] [place], [place]  [id number]  ENDOSCOPY PROCEDURE REPORT  PATIENT:  Karandeep, Haskill  MR#:  [id number] BIRTHDATE:  07/11/1970, 40 yrs. old  GENDER:  male  ENDOSCOPIST:  Docia Chuck. Geri Seminole, MD Referred by:  Milta Deiters, M.D.  PROCEDURE DATE:  04/05/2011 PROCEDURE:  EGD with biopsy, 43239 ASA CLASS:  Class III INDICATIONS:  hematemesis  MEDICATIONS:   Fentanyl 50 mcg IV, Versed 4 mg IV TOPICAL ANESTHETIC:  Cetacaine Spray  DESCRIPTION OF PROCEDURE:   After the risks benefits and alternatives of the procedure were thoroughly explained, informed consent was obtained.  The Pentax Gastroscope [id number] endoscope was introduced through the mouth and advanced to the second portion of the duodenum, without limitations.  The instrument was slowly withdrawn as the mucosa was fully examined. <<PROCEDUREIMAGES>>  Normal esophagus.  Multiple serpigionous ulcers and one 3cm ulcer were found in the antrum. No actve bleedng at this time. Multiple biopsies taken. Otherwise normal stomach.  The duodenal bulb was normal in appearance, as was the postbulbar duodenum. Retroflexed views revealed no abnormalities.    The scope was then withdrawn from the patient and the procedure completed.  COMPLICATIONS:  None  ENDOSCOPIC IMPRESSION: 1) Ulcers, multiple in the antrum (see above) 2) Otherwise normal exam  RECOMMENDATIONS: 1) Continue PPI bid 2) Await biopsy results - r/o MALT 3) MAY BE BEST TO HOLD COUMADIN, GIVEN DEGREE OF ULCERATION AND RECENT BLEEDING, FOR NOW. PRIMARY TO DECIDE IF RISK POFILE REASONABLE  GI WILL FOLLOW...  ______________________________ Docia Chuck. Geri Seminole, MD  CC:  Alric Quan, MD;  The Patient  n. eSIGNED:   Docia Chuck. Geri Seminole at 04/05/2011 01:42 PM  Juliette Mangle, [id number]

## 2011-04-05 NOTE — Progress Notes (Signed)
Patient on Tikosyn, ordered dose for this evening. MD notified of QTC 0.52. Ok to give patient dose this evening. Will continue to monitor.

## 2011-04-05 NOTE — Progress Notes (Signed)
Resident Co-sign Daily Note: I have seen the patient and reviewed the daily progress note by Luster Landsberg  MS IV and discussed the care of the patient with them.  See below for documentation of my findings, assessment, and plans.  Subjective: He still reports having episodes of bloody diarrhea and abdominal pain. He has had three episodes of diarrhea since yesterday. His abdominal pain is located diffusely all over, more in the epigastrium and lower abdomen.Marland KitchenHe reports having some dry heaves last night.   Objective: Vital signs in last 24 hours: Filed Vitals:   04/05/11 1341 04/05/11 1345 04/05/11 1355 04/05/11 1405  BP: 101/33 150/98 139/87 134/96  Pulse:      Temp: 98 F (36.7 C)     TempSrc: Oral     Resp: 18 18 17 20   Height:      Weight:      SpO2: 100% 96%  93%   Physical Exam: BP 134/96  Pulse 88  Temp(Src) 98 F (36.7 C) (Oral)  Resp 20  Ht 6\' 3"  (1.905 m)  Wt 267 lb 14.4 oz (121.519 kg)  BMI 33.49 kg/m2  SpO2 93%  General Appearance:    Alert, cooperative, no distress, appears stated age (41)  Head:    Normocephalic, without obvious abnormality, atraumatic  Eyes:    PERRL, conjunctiva/corneas clear, EOM's intact, fundi    benign, both eyes       Ears:    Normal TM's and external ear canals, both ears  Nose:   Nares normal, septum midline, mucosa normal, no drainage    or sinus tenderness  Throat:   Lips, mucosa, and tongue normal; teeth and gums normal  Neck:   Supple, symmetrical, trachea midline, no adenopathy;       thyroid:  No enlargement/tenderness/nodules; no carotid   bruit or JVD  Back:     Symmetric, no curvature, ROM normal, no CVA tenderness  Lungs:     Clear to auscultation bilaterally, respirations unlabored  Chest wall:    No tenderness or deformity  Heart:    Regular rate and rhythm, S1 and S2 normal, no murmur, rub   or gallop  Abdomen:     Soft, tender to palpation in the epigastrium and lower   abdomen , bowel sounds active all four quadrants,    no  masses, no organomegaly  Genitalia:    Normal male without lesion, discharge or tenderness  Rectal:    Normal tone, normal prostate, no masses or tenderness;   guaiac negative stool  Extremities:   Extremities normal, atraumatic, no cyanosis or edema  Pulses:   2+ and symmetric all extremities  Skin:   Skin color, texture, turgor normal, no rashes or lesions  Lymph nodes:   Cervical, supraclavicular, and axillary nodes normal  Neurologic:   CNII-XII intact. Normal strength, sensation and reflexes      throughout   Lab Results: Reviewed and documented in Electronic Record Micro Results: Reviewed and documented in Electronic Record Studies/Results: Reviewed and documented in Electronic Record Medications: I have reviewed the patient's current medications. Scheduled Meds:   . atorvastatin  20 mg Oral q1800  . digoxin  250 mcg Oral Daily  . dofetilide  250 mcg Oral BID  . furosemide  40 mg Oral Daily  . insulin aspart  0-15 Units Subcutaneous TID WC  . insulin glargine  10 Units Subcutaneous QHS  . lisinopril  10 mg Oral Daily  . metoprolol succinate  100 mg Oral Daily  . pantoprazole (  PROTONIX) IV  40 mg Intravenous Q12H  . potassium chloride  40 mEq Oral BID  . sodium chloride  3 mL Intravenous Q12H   Continuous Infusions:  PRN Meds:.sodium chloride, acetaminophen, morphine injection, nitroGLYCERIN, ondansetron, promethazine, sodium chloride, DISCONTD: butamben-tetracaine-benzocaine, DISCONTD: fentaNYL, DISCONTD: midazolam Assessment/Plan: Principal Problem:  1. Hematemesis: He presents with episodes of hematemesis but has not had any further episodes of bloody vomitus during this hospitalization. Patient has been on long term coumadin for his atrial fibrillation but his INR was therapeutic at the time of admission.Differentials include mallory weiss tear( in the setting of unrelenting emesis  for last 3 days- 10-15 episodes) vs peptic ulcer disease.He reports having some dry heaves  last night. Hand H stable. Appreciate GI inputs.EGD planned for today. - F/U EGD report. - Continue to hold coumadin. - Transfuse if Hb<8. - CBC in AM.  2. Bloody diarrhea: He reports having few episodes of bloody diarrhea for last 3 days. His last episode was this AM.Differentials include  upper GI bleed( mallory weiss vs PUD) vs viral gastroenteritis vs IBD. We think this is most likely related to upper GI bleed. - Continue to monitor H and H  3. Atrial fibrillation: His CHADS2 score is 3.  -Hold coumadin in the setting of GI bleed. -Continue dofetilide and digoxin  4. Diabetes: His AIC is around 13. Poorly controlled diabetes likely in the setting of medication non compliance. He was educated on the end organ damage that can result from poorly controlled DM. He voices understanding.His CBG's are well controlled on SSI and lantus. - Continue current regimen.   5. Chronic systolic congestive heart failure, NYHA class 2; EF- 25% s/p ICD placement;  Continue lisinopril , metoprolol, lipitor, digoxin. - daily weights and I and O's  6. DVT: SCD's   Update: EGD shows multiple serpiginous ulcers, the largest around 3 m in the antrum, multiple biopsies were taken (? rule out MALT). -Protonix 40 mg IV BID. - we will continue to hold coumadin.     LOS: 2 days   Corey Palmer 04/05/2011, 2:53 PM Medical Student Daily Progress Note  Subjective: NAEON.  Hb is 14.6 this AM.  GI will do EGD today to evaulate his upper GI bleed.  Given 50mg  IV Promethazine for dry heaves.  Also complains of blood in his diarrhea yesterday x 1.  Denies SOB, but complains of moderate pain in the epigastrium.  Objective: Vital signs in last 24 hours: Filed Vitals:   04/04/11 1400 04/04/11 2130 04/05/11 0234 04/05/11 0626  BP: 107/75 109/77 122/90 137/98  Pulse: 91 85 88 90  Temp: 97.6 F (36.4 C) 97.5 F (36.4 C) 97.6 F (36.4 C) 97.5 F (36.4 C)  TempSrc: Oral Oral Oral Oral  Resp: 18 20 18 20     Height:      Weight:    121.519 kg (267 lb 14.4 oz)  SpO2: 98% 95% 98% 97%   Weight change: 0.408 kg (14.4 oz)  Intake/Output Summary (Last 24 hours) at 04/05/11 0803 Last data filed at 04/04/11 2207  Gross per 24 hour  Intake    303 ml  Output    300 ml  Net      3 ml   Physical Exam: Vitals Reviewed General: Cooperative obese male sitting in bed in some distress HEENT: normocephalic, no LAD appreciated.  MMM, OP clear CV: Loud S1, normal rate and rhythm. 5 inch scar midline on chest Pulmonary: CTAB, no wheezing Abdominal: exquisitely tender in the epigrastrium.  BS  2+ in all quadrants.  Diffusely tender obese abdomen in upper R and L quadrants.  Extremities: Trace pitting edema appreciated bilaterally on anterior shin, good pedal pulses. Neuro: Alert and oriented x 3 Psych:  Appropriate  Lab Results: Basic Metabolic Panel:  Lab [id number] 04/03/11 1526  NA 138 133*  K 3.7 3.5  CL 98 95*  CO2 31 27  GLUCOSE 188* 277*  BUN 15 13  CREATININE 0.96 0.85  CALCIUM 8.6 8.9  MG -- --  PHOS -- --   Liver Function Tests:  Lab 04/03/11 1526  AST 21  ALT 24  ALKPHOS 79  BILITOT 0.7  PROT 7.0  ALBUMIN 3.0*    Lab 04/03/11 1526  LIPASE 28  AMYLASE --   CBC:  Lab 04/05/11 0615 04/04/11 1722 04/03/11 1526  WBC 6.1 8.5 --  NEUTROABS -- -- 5.4  HGB 14.6 15.2 --  HCT 43.5 44.9 --  MCV 82.1 82.4 --  PLT 204 259 --   Cardiac Enzymes:  Lab 04/04/11 0900 04/04/11 0243 04/03/11 2112  CKTOTAL 91 111 131  CKMB 4.1* 4.3* 4.5*  CKMBINDEX -- -- --  TROPONINI <0.30 <0.30 <0.30   CBG:  Lab 04/05/11 0624 04/04/11 2129 04/04/11 1642 04/04/11 1106 04/04/11 0554 04/03/11 2021  GLUCAP 139* 187* 205* 176* 162* 229*   Hemoglobin A1C: No results found for this basename: HGBA1C in the last 168 hours Coagulation:  Lab 04/05/11 0615 04/03/11 1526  LABPROT 18.8* 23.3*  INR 1.54* 2.03*   Anemia Panel: No results found for this basename:  VITAMINB12,FOLATE,FERRITIN,TIBC,IRON,RETICCTPCT in the last 168 hours Urine Drug Screen: Drugs of Abuse     Component Value Date/Time   LABOPIA NONE DETECTED 03/08/2008 0900   COCAINSCRNUR NONE DETECTED 03/08/2008 0900   LABBENZ NONE DETECTED 03/08/2008 0900   AMPHETMU NONE DETECTED 03/08/2008 0900   THCU NONE DETECTED 03/08/2008 0900   LABBARB  Value: NONE DETECTED        DRUG SCREEN FOR MEDICAL PURPOSES ONLY.  IF CONFIRMATION IS NEEDED FOR ANY PURPOSE, NOTIFY LAB WITHIN 5 DAYS.        LOWEST DETECTABLE LIMITS FOR URINE DRUG SCREEN Drug Class       Cutoff (ng/mL) Amphetamine      1000 Barbiturate      200 Benzodiazepine   [id number] Tricyclics       [id number] Opiates          300 Cocaine          300 THC              50 03/08/2008 0900    Alcohol Level: No results found for this basename: ETH:2 in the last 168 hours Urinalysis: No results found for this basename: COLORURINE:2,APPERANCEUR:2,LABSPEC:2,PHURINE:2,GLUCOSEU:2,HGBUR:2,BILIRUBINUR:2,KETONESUR:2,PROTEINUR:2,UROBILINOGEN:2,NITRITE:2,LEUKOCYTESUR:2 in the last 168 hours Misc. Labs: Hb of 12.9 in Jan 2013  Micro Results: No results found for this or any previous visit (from the past 240 hour(s)). Studies/Results: Dg Abd Acute W/chest  04/03/2011  *RADIOLOGY REPORT*  Clinical Data: Epigastric pain, nausea and vomiting.  ACUTE ABDOMEN SERIES (ABDOMEN 2 VIEW & CHEST 1 VIEW)  Comparison: No priors.  Findings: Lung volumes are normal.  No consolidative airspace disease.  No pleural effusions.  Pulmonary vascular congestion without Nehal pulmonary edema.  Moderate cardiomegaly.  Mediastinal contours are unremarkable.  The patient is status post median sternotomy.  A single lead pacemaker / AICD is in place with lead tip projecting over the expected location of the right ventricular apex.  Supine and upright views of  the abdomen demonstrate gas and stool scattered throughout the colon extending to the distal rectum.  No pathologic distension of small bowel is  identified.  A single loop of borderline dilated small bowel (up to 2.9 cm in diameter) is noted in the left upper quadrant of the abdomen.  No gross evidence of pneumoperitoneum.  Surgical clips projecting over the right upper quadrant of the abdomen, likely from prior cholecystectomy.  IMPRESSION: 1.  Nonspecific, nonobstructive bowel gas pattern, as above. 2.  No pneumoperitoneum. 3.  Status post cholecystectomy. 4.  Moderate cardiomegaly with pulmonary venous congestion, but without [place] pulmonary edema. 5.  Postoperative changes and support apparatus, as above.  Original Report Authenticated By: Etheleen Mayhew, M.D.   Medications: I have reviewed the patient's current medications. Scheduled Meds:    . atorvastatin  20 mg Oral q1800  . digoxin  250 mcg Oral Daily  . dofetilide  250 mcg Oral BID  . furosemide  40 mg Oral Daily  . insulin aspart  0-15 Units Subcutaneous TID WC  . insulin glargine  10 Units Subcutaneous QHS  . lisinopril  10 mg Oral Daily  . metoprolol succinate  100 mg Oral Daily  . pantoprazole (PROTONIX) IV  40 mg Intravenous Q12H  . potassium chloride  40 mEq Oral BID  . sodium chloride  3 mL Intravenous Q12H  . DISCONTD: warfarin  10 mg Oral ONCE-1800  . DISCONTD: Warfarin - Pharmacist Dosing Inpatient   Does not apply q1800   Continuous Infusions:    . sodium chloride 75 mL/hr at 04/03/11 2306   PRN Meds:.sodium chloride, acetaminophen, morphine injection, nitroGLYCERIN, ondansetron, promethazine, sodium chloride Assessment/Plan: Principal Problem:  1. Hematemesis; Patient report Active Problems:  AODM  DYSLIPIDEMIA  Essential hypertension, benign  Atrial fibrillation  Chronic systolic congestive heart failure, NYHA class 2  Long term current use of anticoagulant  Nonspecific abnormal finding in stool contents  Gastroenteritis  [name] is a 41 yo man who presented with a h/o 3 days of nausea, vomiting (10-15 x day) and hemetemsis.  He has not  vomited since admission.  He had a positive FOBT in the ED, and his hemoglobin has remained steady between 14-16.  GI will perform EGD today for mallory-weiss vs. Viral gastroenteritis.  His wife, Sidh Lenox can be reached at [phone] and brother Gwyndolyn Saxon at [phone].  1.  Hematemesis - Given the history of present illness, particularly 1 styrofoam cup full per patient en route to hospital, a non-specific chest xray and dropping Hemoglobin, we suspect this patient may have an upper GI bleed from Ulcer vs. Mallory weiss tears vs. Mucosal disease.  He had 10-15 vomiting episode for 3 days, with new onset of epigastric pain for 24 hours prior to admission.  He is exquisitely tender over the epigastrium and was diffusely tender in all 4 abdominal quadrants.  - Hb stable at 14.6, will transfuse if drops below 8, but unlikely as there has not been hematemesis since admission.   - FOBT positive  - Protonix 40mg  IV BID  - GI Consult EGD at 1pm today  - Clear liquids  - Zofran prn nausea, Morphine prn pain. Promethazine prn nausea dry heaves  - CBC Q12H  2.  Uncontrolled insulin dependent DM2 - Pt has poor sugar control and is not currently taking any PO DM medications.  He takes 5 units of novolog with meals and 10 units of Lantus as night.  Does not monitor sugars or diet at home. Last  A1c of 12.9 in January.  - Moderate SSI, will consider more stringent SSI if poor control on this regiment. He has sugars ranging from 139-205 since yesterday, will keep on moderate on SSI  - Glargine 10 units QD  3.  CHF with EF of 0000000 - Complicated cardiac history as noted in HPI with PAF, AICD, and htn.  - Digoxin 252mcg QD  - Dofetilide 270mcg BID, with maintaining K > 4.0 per pharmacy.  Will order potassium PO supplementation as needed.  4.  Paroxsymal Atrial Fibrillation - Mr. Piontek has a complicated cardiac history and has an AICD in place.  Coumadin dosing at home is 20mg  T/Th and 10mg  every other day.  He  also takes ASA at home.  He last took his coumadin dose was 20mg  on Tuesday evening.  INR 1.54 today.  - We will hold coumadin in the setting of upper GI bleed, and check his daily PT/INR.  Has not been corrected with Vitamin K, etc. As his bleeding is stopped.  - Patient has AICD in place   5.  Dyslipidemia - He has an LDL of 130 from his outpatient clinic visits here at [facility], but has yet to be started on a statin because of lack of follow up.  We will initiate statin therapy of Lipitor 20mg  QD with the goal of reducing his LDL to less than 100.  - Initiate Lipitor 20mg  QD  6.  Hypertension - He has a well documented history of hypertension.  His pressure has been under good control here, 130s/90s.  We will Continue his home regimen.  - Continue Lasix 40mg  QD  - Continue Lisinopril 10mg  QD  - Continue Toprol-XL 100mg  QD  7.  Diet/Fluids - Clear liquids, NSL   8.  Disposition - Pending GI procedure.  Likely to d/c today/tomorrow if no rebleed.   LOS: 2 days   This is a Careers information officer Note.  The care of the patient was discussed with Dr. Doug Sou and the assessment and plan formulated with their assistance.  Please see their attached note for official documentation of the daily encounter.  Posey Pronto, [place] 04/05/2011, 8:03 AM

## 2011-04-06 LAB — CBC
Hemoglobin: 14.5 g/dL (ref 13.0–17.0)
MCHC: 33.8 g/dL (ref 30.0–36.0)
Platelets: 219 10*3/uL (ref 150–400)

## 2011-04-06 LAB — BASIC METABOLIC PANEL
Calcium: 8.5 mg/dL (ref 8.4–10.5)
GFR calc Af Amer: 86 mL/min — ABNORMAL LOW (ref >90)
GFR calc non Af Amer: 75 mL/min — ABNORMAL LOW (ref >90)
Glucose, Bld: 179 mg/dL — ABNORMAL HIGH (ref 70–99)
Potassium: 4 mEq/L (ref 3.5–5.1)
Sodium: 140 mEq/L (ref 135–145)

## 2011-04-06 LAB — GLUCOSE, CAPILLARY
Glucose-Capillary: 175 mg/dL — ABNORMAL HIGH (ref 70–99)
Glucose-Capillary: 170 mg/dL — ABNORMAL HIGH (ref 70–99)

## 2011-04-06 LAB — MAGNESIUM: Magnesium: 1.9 mg/dL (ref 1.5–2.5)

## 2011-04-06 MED ORDER — INSULIN GLARGINE 100 UNIT/ML ~~LOC~~ SOLN
15.0000 [IU] | Freq: Every day | SUBCUTANEOUS | Status: DC
  Administered 2011-04-06: 15 [IU] via SUBCUTANEOUS
  Filled 2011-04-06: qty 3

## 2011-04-06 NOTE — Progress Notes (Signed)
Resident Co-sign Daily Note: I have seen the patient and reviewed the daily progress note by Luster Landsberg MS IV and discussed the care of the patient with them.  See below for documentation of my findings, assessment, and plans.  Subjective:  He reports feeling okay. Denies any bloody diarrhea, hematemesis.  Objective: Vital signs in last 24 hours: Filed Vitals:   04/05/11 2028 04/06/11 0410 04/06/11 0952 04/06/11 0953  BP: 130/95 137/96  149/109  Pulse: 91 94 94   Temp: 98.2 F (36.8 C) 98.7 F (37.1 C)    TempSrc: Oral Oral    Resp: 20 20    Height:      Weight:  268 lb 11.2 oz (121.882 kg)    SpO2: 94% 98%     Physical Exam: BP 149/109  Pulse 94  Temp(Src) 98.7 F (37.1 C) (Oral)  Resp 20  Ht 6\' 3"  (1.905 m)  Wt 268 lb 11.2 oz (121.882 kg)  BMI 33.59 kg/m2  SpO2 98%  General Appearance:    Alert, cooperative, no distress, appears stated age  Head:    Normocephalic, without obvious abnormality, atraumatic  Eyes:    PERRL, conjunctiva/corneas clear, EOM's intact, fundi    benign, both eyes       Ears:    Normal TM's and external ear canals, both ears  Nose:   Nares normal, septum midline, mucosa normal, no drainage    or sinus tenderness  Throat:   Lips, mucosa, and tongue normal; teeth and gums normal  Neck:   Supple, symmetrical, trachea midline, no adenopathy;       thyroid:  No enlargement/tenderness/nodules; no carotid   bruit or JVD  Back:     Symmetric, no curvature, ROM normal, no CVA tenderness  Lungs:     Clear to auscultation bilaterally, respirations unlabored  Chest wall:    No tenderness or deformity  Heart:    Regular rate and rhythm, S1 and S2 normal, no murmur, rub   or gallop  Abdomen:     Soft, tender to palpation in the epigastrium, bowel sounds active all four quadrants,    no masses, no organomegaly  Genitalia:    Normal male without lesion, discharge or tenderness  Rectal:    Normal tone, normal prostate, no masses or tenderness;   guaiac  negative stool  Extremities:   Extremities normal, atraumatic, no cyanosis or edema  Pulses:   2+ and symmetric all extremities  Skin:   Skin color, texture, turgor normal, no rashes or lesions  Lymph nodes:   Cervical, supraclavicular, and axillary nodes normal  Neurologic:   CNII-XII intact. Normal strength, sensation and reflexes      throughout   Lab Results: Reviewed and documented in Electronic Record Micro Results: Reviewed and documented in Electronic Record Studies/Results: Reviewed and documented in Electronic Record Medications: I have reviewed the patient's current medications. Scheduled Meds:   . atorvastatin  20 mg Oral q1800  . digoxin  250 mcg Oral Daily  . dofetilide  250 mcg Oral BID  . furosemide  40 mg Oral Daily  . insulin aspart  0-15 Units Subcutaneous TID WC  . insulin glargine  10 Units Subcutaneous QHS  . lisinopril  10 mg Oral Daily  . metoprolol succinate  100 mg Oral Daily  . pantoprazole (PROTONIX) IV  40 mg Intravenous Q12H  . potassium chloride  40 mEq Oral BID  . sodium chloride  3 mL Intravenous Q12H   Continuous Infusions:  PRN Meds:.sodium  chloride, acetaminophen, morphine injection, nitroGLYCERIN, ondansetron, promethazine, sodium chloride, DISCONTD: butamben-tetracaine-benzocaine, DISCONTD: fentaNYL, DISCONTD: midazolam  Assessment/Plan: 1. Hematemesis and melena: status post EGD- that's shows multiple serpiginous ulcers with biopsies pending (? rule out MALT). He presented with episodes of hematemesis but has not had any further episodes of bloody vomitus during this hospitalization. Denies nay further episodes of melena.Patient has been on long term coumadin for his atrial fibrillation but his INR was therapeutic at the time of admission. - Continue to hold coumadin.  - Transfuse if Hb<8.  - CBC in AM  3. Atrial fibrillation: His CHADS2 score is 3. He had 21 beat run of V tach last night but was completely asymptomatic. His electrolytes  reviewed (K- 4.0., Mg -1.9) -Hold coumadin in the setting of GI bleed.  -Continue dofetilide and digoxin .  4. Diabetes: His AIC is around 13. Poorly controlled diabetes likely in the setting of medication non compliance. He was educated on the end organ damage that can result from poorly controlled DM. He voices understanding.His CBG's are well controlled on SSI and lantus.  - Change lantus to 15 units and continue moderate SSI.   5. Chronic systolic congestive heart failure, NYHA class 2; EF- 25% s/p ICD placement;  Continue lisinopril , metoprolol, lasix, lipitor, digoxin.  - daily weights and I and O's   6. DVT: SCD's   7. Dispo: Awaiting biopsy results.       LOS: 3 days   Aprille  04/06/2011, 11:42 AMMedical Student Daily Progress Note  Subjective: NAEON.  Hb is 14.5 this AM.  GI performed EGD yesterday which showed serpiginous ulcers that require r/o of MALT.  Denies SOB, CP, fevers, chills, but complains of continued spasmic pain the his abdomen.  He denies blood in his stool.  Objective: Vital signs in last 24 hours: Filed Vitals:   04/05/11 1405 04/05/11 1506 04/05/11 2028 04/06/11 0410  BP: 134/96 126/89 130/95 137/96  Pulse:  87 91 94  Temp:  97.8 F (36.6 C) 98.2 F (36.8 C) 98.7 F (37.1 C)  TempSrc:  Oral Oral Oral  Resp: 20 20 20 20   Height:      Weight:    121.882 kg (268 lb 11.2 oz)  SpO2: 93% 93% 94% 98%   Weight change: 0.363 kg (12.8 oz)  Intake/Output Summary (Last 24 hours) at 04/06/11 0803 Last data filed at 04/06/11 [id number]  Gross per 24 hour  Intake    820 ml  Output    475 ml  Net    345 ml   Physical Exam: Vitals Reviewed General: Cooperative obese male sitting in bed in some distress HEENT: normocephalic, no LAD appreciated.  MMM, OP clear CV: Loud S1, normal rate and rhythm. 5 inch scar midline on chest Pulmonary: CTAB, no wheezing Abdominal: Mildly tender in the epigrastrium.  BS 2+ in all quadrants.  Diffusely tender obese  abdomen in upper R and L quadrants.  Extremities: Trace pitting edema appreciated bilaterally on anterior shin, good pedal pulses. Neuro: Alert and oriented x 3 Psych:  Appropriate  Lab Results: Basic Metabolic Panel:  Lab [id number] 04/04/11 0242  NA 140 138  K 4.0 3.7  CL 105 98  CO2 27 31  GLUCOSE 179* 188*  BUN 22 15  CREATININE 1.18 0.96  CALCIUM 8.5 8.6  MG 1.9 --  PHOS -- --   Liver Function Tests:  Lab 04/03/11 1526  AST 21  ALT 24  ALKPHOS 79  BILITOT 0.7  PROT 7.0  ALBUMIN 3.0*    Lab 04/03/11 1526  LIPASE 28  AMYLASE --   CBC:  Lab 04/06/11 0420 04/05/11 1634 04/03/11 1526  WBC 7.0 8.0 --  NEUTROABS -- -- 5.4  HGB 14.5 15.9 --  HCT 42.9 46.3 --  MCV 81.6 81.7 --  PLT 219 205 --   Cardiac Enzymes:  Lab 04/04/11 0900 04/04/11 0243 04/03/11 2112  CKTOTAL 91 111 131  CKMB 4.1* 4.3* 4.5*  CKMBINDEX -- -- --  TROPONINI <0.30 <0.30 <0.30   CBG:  Lab 04/06/11 0625 04/05/11 1549 04/05/11 1057 04/05/11 0624 04/04/11 2129 04/04/11 1642  GLUCAP 175* 164* 120* 139* 187* 205*   Hemoglobin A1C: No results found for this basename: HGBA1C in the last 168 hours Coagulation:  Lab 04/05/11 0615 04/03/11 1526  LABPROT 18.8* 23.3*  INR 1.54* 2.03*   Anemia Panel: No results found for this basename: VITAMINB12,FOLATE,FERRITIN,TIBC,IRON,RETICCTPCT in the last 168 hours Urine Drug Screen: Drugs of Abuse     Component Value Date/Time   LABOPIA NONE DETECTED 03/08/2008 0900   COCAINSCRNUR NONE DETECTED 03/08/2008 0900   LABBENZ NONE DETECTED 03/08/2008 0900   AMPHETMU NONE DETECTED 03/08/2008 0900   THCU NONE DETECTED 03/08/2008 0900   LABBARB  Value: NONE DETECTED        DRUG SCREEN FOR MEDICAL PURPOSES ONLY.  IF CONFIRMATION IS NEEDED FOR ANY PURPOSE, NOTIFY LAB WITHIN 5 DAYS.        LOWEST DETECTABLE LIMITS FOR URINE DRUG SCREEN Drug Class       Cutoff (ng/mL) Amphetamine      1000 Barbiturate      200 Benzodiazepine   [id number] Tricyclics       [id number] Opiates           300 Cocaine          300 THC              50 03/08/2008 0900    Alcohol Level: No results found for this basename: ETH:2 in the last 168 hours Urinalysis: No results found for this basename: COLORURINE:2,APPERANCEUR:2,LABSPEC:2,PHURINE:2,GLUCOSEU:2,HGBUR:2,BILIRUBINUR:2,KETONESUR:2,PROTEINUR:2,UROBILINOGEN:2,NITRITE:2,LEUKOCYTESUR:2 in the last 168 hours Misc. Labs: Hb of 12.9 in Jan 2013  Micro Results: No results found for this or any previous visit (from the past 240 hour(s)). Studies/Results: No results found. Medications: I have reviewed the patient's current medications. Scheduled Meds:    . atorvastatin  20 mg Oral q1800  . digoxin  250 mcg Oral Daily  . dofetilide  250 mcg Oral BID  . furosemide  40 mg Oral Daily  . insulin aspart  0-15 Units Subcutaneous TID WC  . insulin glargine  10 Units Subcutaneous QHS  . lisinopril  10 mg Oral Daily  . metoprolol succinate  100 mg Oral Daily  . pantoprazole (PROTONIX) IV  40 mg Intravenous Q12H  . potassium chloride  40 mEq Oral BID  . sodium chloride  3 mL Intravenous Q12H   Continuous Infusions:   PRN Meds:.sodium chloride, acetaminophen, morphine injection, nitroGLYCERIN, ondansetron, promethazine, sodium chloride, DISCONTD: butamben-tetracaine-benzocaine, DISCONTD: fentaNYL, DISCONTD: midazolam Assessment/Plan: Principal Problem:  1. Hematemesis; Patient report Active Problems:  AODM  DYSLIPIDEMIA  Essential hypertension, benign  Atrial fibrillation  Chronic systolic congestive heart failure, NYHA class 2  Long term current use of anticoagulant  Nonspecific abnormal finding in stool contents  Gastroenteritis  Corey Palmer is a 41 yo man who presented with a h/o 3 days of nausea, vomiting (10-15 x day) and hemetemsis.  He has not vomited since admission.  He had a  positive FOBT in the ED, and his hemoglobin has remained steady between 14-16.  GI biopsied several ulcers in the stomach to r/o MALT.  We are keeping him at least  until Monday to monitor his INR since we are holding Coumadin in the setting of GI bleeding.  His wife, Devlon Tatge can be reached at [phone] and brother Gwyndolyn Saxon at [phone].  1.  Hematemesis - Given the history of present illness, particularly 1 styrofoam cup full per patient en route to hospital, a non-specific chest xray and dropping Hemoglobin, we suspected this patient may have an upper GI bleed from Ulcer vs. Mallory weiss tears vs. Mucosal disease.  EGD confirmed gastric ulcers. He is somewhat tender over the epigastrium and was diffusely tender in all 4 abdominal quadrants.  - Hb stable at 14.5, will transfuse if drops below 8, but unlikely as there has not been hematemesis since admission.   - FOBT positive  - Protonix 40mg  IV BID  - GI Consult - they are following this patient.  - Advancing diet  - Zofran prn nausea, Morphine prn pain. Promethazine prn nausea dry heaves  - CBC Q24H  2.  Uncontrolled insulin dependent DM2 - Pt has poor sugar control and is not currently taking any PO DM medications.  He takes 5 units of novolog with meals and 10 units of Lantus as night.  Does not monitor sugars or diet at home. Last A1c of 12.9 in January.  - Moderate SSI, will consider more stringent SSI if poor control on this regiment. He has sugars ranging from 139-205 since yesterday, will keep on moderate on SSI  - Glargine 10 units QD  3.  CHF with EF of 0000000 - Complicated cardiac history as noted in HPI with PAF, AICD, and htn.  - Digoxin 240mcg QD  - Dofetilide 270mcg BID, with maintaining K > 4.0 per pharmacy.  Will order potassium PO supplementation as needed.  4.  Paroxsymal Atrial Fibrillation - Mr. Preast has a complicated cardiac history and has an AICD in place.  Coumadin dosing at home is 20mg  M/Th and 10mg  every other day.  He last took his coumadin dose was 20mg  on Tuesday evening.  INR pending for today.  - We will hold coumadin in the setting of upper GI bleed, and check his  daily PT/INR.  Has not been corrected with Vitamin K, etc. As his bleeding is stopped.  - Patient has AICD in place  - CHADS2 score is 3, with 5.9% stroke risk/year.   5.  Dyslipidemia - He has an LDL of 130 from his outpatient clinic visits here at [facility], but has yet to be started on a statin because of lack of follow up.  We will initiate statin therapy of Lipitor 20mg  QD with the goal of reducing his LDL to less than 100.  - Initiate Lipitor 20mg  QD  6.  Hypertension - He has a well documented history of hypertension.  His pressure has been under good control here, 130s/90s.  We will Continue his home regimen.  - Continue Lasix 40mg  QD  - Continue Lisinopril 10mg  QD  - Continue Toprol-XL 100mg  QD  7.  Diet/Fluids - Advancing as tolerated., NSL  8. VTE - SCDs  9.  Disposition - Waiting for pathology result on biopsy, may need surgery next week if MALT.   LOS: 3 days   This is a Careers information officer Note.  The care of the patient was discussed with Dr. Doug Sou and the assessment  and plan formulated with their assistance.  Please see their attached note for official documentation of the daily encounter.  Posey Pronto, Mehul Chimanlal 04/06/2011, 8:03 AM

## 2011-04-06 NOTE — Progress Notes (Signed)
     [facility] Daily Rounding Note 04/06/2011, 8:47 AM  SUBJECTIVE:       21 beat run of V tach early this AM, pt asymptomatic.  Tolerating solids.  Lose stool yesterday was brown.  Wondering when he can go home.  Feels well.    OBJECTIVE:        General: does not look ill.     Vital signs in last 24 hours:    Temp:  [97.1 F (36.2 C)-98.7 F (37.1 C)] 98.7 F (37.1 C) (03/09 0410) Pulse Rate:  [87-94] 94  (03/09 0410) Resp:  [14-33] 20  (03/09 0410) BP: (101-165)/(33-106) 137/96 mmHg (03/09 0410) SpO2:  [89 %-100 %] 98 % (03/09 0410) Weight:  [268 lb 11.2 oz (121.882 kg)] 268 lb 11.2 oz (121.882 kg) (03/09 0410) Last BM Date: 04/05/11 (no bld in stool)  Heart: RRR with 1/6 syst murmer.  Chest: Clear.  No resp distress. Abdomen: Obese, soft, NT, ND.  Active BS  Extremities: no pedal edema Neuro/Psych:  Pleasant.  No tremor.    Intake/Output from previous day: 03/08 0701 - 03/09 0700 In: 66 [P.O.:810; IV Piggyback:10] Out: 475 [Urine:475]  Intake/Output this shift:    Lab Results:  Basename 04/06/11 0420 04/05/11 1634 04/05/11 0615  WBC 7.0 8.0 6.1  HGB 14.5 15.9 14.6  HCT 42.9 46.3 43.5  PLT 219 205 204   BMET  Basename 04/06/11 0420 04/04/11 0242 04/03/11 1526  NA 140 138 133*  K 4.0 3.7 3.5  CL 105 98 95*  CO2 27 31 27   GLUCOSE 179* 188* 277*  BUN 22 15 13   CREATININE 1.18 0.96 0.85  CALCIUM 8.5 8.6 8.9   LFT  Basename 04/03/11 1526  PROT 7.0  ALBUMIN 3.0*  AST 21  ALT 24  ALKPHOS 79  BILITOT 0.7  BILIDIR --  IBILI --   PT/INR  Basename 04/05/11 0615 04/03/11 1526  LABPROT 18.8* 23.3*  INR 1.54* 2.03*   ASSESMENT: 1.  Multiple antral gastic ulcers.  Biopsies pending.  On Protonix 40 mg IV BID. Melena and hemetemesis at admission following 2 to 3 days of n/v/d sxs. 2.  Cardiomyopathy.  S/P ICD.  Note ventricular arrhythmia overnight.  3.  Chronic Coumadin, for hx parox a fib, on hold.  4.  IDDM  PLAN: 1.  Stay off coumadin for now, Dr  Henrene Pastor can give a better idea of duration of the desired Coumadin "holiday". 2.  Continue current BID IV Protonix.   3.  Await biopsies of stomach.     LOS: 3 days   Corey Palmer  04/06/2011, 8:47 AM Pager: [phone]   GI ATTENDING  SEEN AND EXAMINED. AGREE WITH ABOVE. NO C/O. REVIEWED REPORT WITH PT. IF HE IS GOING TO STAY OFF COUMADIN, CAN SEND OUT ON BID PPI. HE WILL NEED TO FOLLOW UP WITH ME  IN 3-4 WEEKS. I WILL F/U BX. ADVANCE DIET  Corey Palmer N. Geri Seminole., M.D. [facility] Division of Gastroenterology

## 2011-04-06 NOTE — Progress Notes (Signed)
Patient had 21 beat run of V-Tach. Patient sleeping at the time no signs of distress or symptoms. MD notified. Orders for BMET and magnesium to be drawn. Will continue to monitor.

## 2011-04-07 LAB — CBC
MCH: 27.1 pg (ref 26.0–34.0)
MCHC: 32.7 g/dL (ref 30.0–36.0)
MCV: 82.7 fL (ref 78.0–100.0)
Platelets: 230 10*3/uL (ref 150–400)
RBC: 5.32 MIL/uL (ref 4.22–5.81)
RDW: 13.7 % (ref 11.5–15.5)

## 2011-04-07 LAB — GLUCOSE, CAPILLARY: Glucose-Capillary: 193 mg/dL — ABNORMAL HIGH (ref 70–99)

## 2011-04-07 MED ORDER — SIMVASTATIN 40 MG PO TABS: 40.0000 mg | ORAL_TABLET | Freq: Every evening | ORAL | Status: DC

## 2011-04-07 MED ORDER — PANTOPRAZOLE SODIUM 40 MG PO TBEC: 40.0000 mg | DELAYED_RELEASE_TABLET | Freq: Two times a day (BID) | ORAL | Status: DC

## 2011-04-07 NOTE — Progress Notes (Signed)
Nursing Note: Pt's QTC 0.48 before administration of Tikosyn. Tikosyn given. Pt stable. Will continue to monitor pt. Joseline Mccampbell Scientific laboratory technician.

## 2011-04-07 NOTE — Discharge Summary (Signed)
[facility] Discharge Note  Name: Corey Palmer MRN: [id number] DOB: 06/22/1970 41 y.o.  Date of Admission: 04/03/2011  3:16 PM Date of Discharge: 04/07/2011 Attending Physician: No att. providers found  Discharge Diagnosis: Principal Problem:  *Hematemesis Active Problems:  AODM  DYSLIPIDEMIA  Essential hypertension, benign  Atrial fibrillation  Chronic systolic congestive heart failure, NYHA class 2  Long term current use of anticoagulant  Nonspecific abnormal finding in stool contents  Gastroenteritis  Gastric ulcer, acute  Discharge Medications: Medication List  As of 04/07/2011 11:56 AM   STOP taking these medications         warfarin 10 MG tablet         TAKE these medications         colchicine 0.6 MG tablet   Take 0.6 mg by mouth as needed. For gout flare ups      digoxin 0.25 MG tablet   Commonly known as: LANOXIN   Take 1 tablet (250 mcg total) by mouth daily.      dofetilide 250 MCG capsule   Commonly known as: TIKOSYN   Take 250 mcg by mouth 2 (two) times daily.      furosemide 40 MG tablet   Commonly known as: LASIX   Take 1 tablet (40 mg total) by mouth 2 (two) times daily.      insulin aspart 100 UNIT/ML injection   Commonly known as: novoLOG   Inject 5 Units into the skin 3 (three) times daily before meals.      insulin glargine 100 UNIT/ML injection   Commonly known as: LANTUS   Inject 15 Units into the skin at bedtime.      Insulin Pen Needle 31G X 8 MM Misc   Use to inject insulin 4 times a day      lisinopril 10 MG tablet   Commonly known as: PRINIVIL,ZESTRIL   Take 1 tablet (10 mg total) by mouth daily.      metoprolol succinate 100 MG 24 hr tablet   Commonly known as: TOPROL-XL   Take 1 tablet (100 mg total) by mouth daily. Take with or immediately following a meal.      nitroGLYCERIN 0.4 MG SL tablet   Commonly known as: NITROSTAT   Place 1 tablet (0.4 mg total) under the tongue every 5 (five)  minutes x 3 doses as needed for chest pain.      pantoprazole 40 MG tablet   Commonly known as: PROTONIX   Take 1 tablet (40 mg total) by mouth 2 (two) times daily.      simvastatin 40 MG tablet   Commonly known as: ZOCOR   Take 1 tablet (40 mg total) by mouth every evening.            Disposition and follow-up:   Corey Palmer was discharged from [facility] in Stable condition. He was advised to hold his coumadin.   Labs to follow at clinic follow up : CBC                                                            Biopsies taken during EGD.   Follow-up Appointments: Follow-up Information    Follow up with Scarlette Shorts, MD on 05/09/2011. (8:45 AM.  Follow up  gastric ulcers. )    Contact information:   [address] [facility] [place] [phone]       Call [facility]. (Please call to make an appointment in  1 week )    Contact information:   [place] [phone]        Discharge Orders    Future Appointments: Provider: Department: Dept Phone: Center:   04/08/2011 1:30 PM Chauncey Reading Plyler, RD [facility] [phone] [facility]   04/08/2011 2:30 PM [facility] [id number] [facility]   04/08/2011 2:45 PM Vertell Limber, MD [facility] [phone] [facility]   05/09/2011 8:45 AM Irene Shipper, MD [facility] [phone] [facility]     Future Orders Please Complete By Expires   Diet - low sodium heart healthy      Increase activity slowly      Call MD for:  temperature >100.4      Call MD for:  extreme fatigue      Call MD for:  persistant dizziness or light-headedness         Consultations: Gastroenterology    Procedures Performed:  Dg Abd Acute W/chest  04/03/2011  *RADIOLOGY REPORT*  Clinical Data: Epigastric pain, nausea and vomiting.  ACUTE ABDOMEN SERIES (ABDOMEN 2 VIEW & CHEST 1 VIEW)  Comparison: No priors.  Findings: Lung  volumes are normal.  No consolidative airspace disease.  No pleural effusions.  Pulmonary vascular congestion without Tung pulmonary edema.  Moderate cardiomegaly.  Mediastinal contours are unremarkable.  The patient is status post median sternotomy.  A single lead pacemaker / AICD is in place with lead tip projecting over the expected location of the right ventricular apex.  Supine and upright views of the abdomen demonstrate gas and stool scattered throughout the colon extending to the distal rectum.  No pathologic distension of small bowel is identified.  A single loop of borderline dilated small bowel (up to 2.9 cm in diameter) is noted in the left upper quadrant of the abdomen.  No gross evidence of pneumoperitoneum.  Surgical clips projecting over the right upper quadrant of the abdomen, likely from prior cholecystectomy.  IMPRESSION: 1.  Nonspecific, nonobstructive bowel gas pattern, as above. 2.  No pneumoperitoneum. 3.  Status post cholecystectomy. 4.  Moderate cardiomegaly with pulmonary venous congestion, but without Sulaiman pulmonary edema. 5.  Postoperative changes and support apparatus, as above.  Original Report Authenticated By: Etheleen Mayhew, M.D.     Upper endoscopy: Normal esophagus. Multiple serpigionous ulcers and one 3cm ulcer were found in the antrum. No actve bleedng at this time. Multiple biopsies taken. Otherwise normal stomach. The duodenal bulb was normal in appearance, as was the postbulbar duodenum. Retroflexed views revealed no abnormalities. The scope was then withdrawn from the patient and the procedure completed.    Admission HPI: Pt is a 41 yo M with a h/o dilated cardiomyopathy (EF 20-25% in 2012) s/p AICD, HTN, PAF on coumadin, and DMII who presents to [facility] for evaluation of 3 day history of nausea and vomiting. Pt indicates 10-15 episodes of vomiting daily over this time frame, initially containing food contents. However, on day of admission pt noted gross blood in  his vomitus. Pt also experienced watery diarrhea that began on the day prior to admission - however, on the day of admission, noticed darkened color of stools, black color with some blood  tinging the toilet. Also confirms chills.  Additionally, pt confirms sharp epigastric pain, and low chest pain that has been intermittent since onset - cannot identify specific precipitating factors. Confirms shortness of breath. No recent sick contacts, no recent new foods, no travel to new places.    Hospital Course by problem list: Principal Problem:  1.Hematemesis and bloody diarrhea: Patient presented with 3-4 days of intractable nausea,vomiting and diarrhea that was further complicated by hematemesis and dark stools on the fourth  of his illness and he was admitted for further management. GI was consulted and patient underwent endoscopy that showed multiple serpiginous ulcers and one 3 cm ulcer  in the antrum. Biopsies were taken during the procedure that were pending at the time of discharge but there was a suspicion for MALT based on EGD findings. GI advised that it may be best to hold Coumadin given the degree of ulceration and recent bleeding. Of note that the patient is on Coumadin given his history of atrial fibrillation and his INR was therapeutic at the time of admission. Patient had no further episodes of hematemesis during his hospital stay but he had one or 2 episodes of bloody diarrhea which resolved at the time of discharge. His H&H continued to be stable throughout his hospital stay. The patient was discharged home with instructions to hold Coumadin for now and a followup with GI in 4 weeks. He would also followup at [facility] outpatient clinic in 11 week when the decision to resume Coumadin could be made based on biopsy findings. We also need to check his CBC to make sure that he hasn't dropped his hemoglobin.  2. Poorly controlled diabetes: His last A1c was 12.9 in January of 2013 in the setting of  medication noncompliance. Patient was counseled on the importance of medication adherence and possible endorgan damage that he could have from poorly controlled diabetes. His sugars are fairly well controlled during this hospital stay on his home regimen. He was discharged home on his home regimen that includes 15 units of Lantus at bedtime and 5 units of NovoLog with each meal. He was repeatedly asked if we needed to simplify the regimen but he wanted to stick to his old regimen.   3.Atrial fibrillation: His rate was well controlled. He did have one episode of 21 beat run of V. Tach during this hospital admission but he was completely asymptomatic. His electrolytes were checked ( Mg and K  )which were within normal limits. He was continued on the dofetilide and digoxin. His Coumadin was held in the setting of recent GI bleed and given the appearance of ulcers on his EGD, the decision to further continue holding Coumadin was made.  3. Chronic systolic congestive heart failure, NYHA class 2 DF of 25% status post ICD. He did not exhibit any signs of CHF exacerbation during this admission. He was continued on lisinopril, metoprolol, Lipitor, digoxin.  4. Hypertension. His blood pressure was very well controlled throughout his stay.  5 Hyperlipidemia: His Lipitor was changed to Zocor because of the cost.       Discharge Vitals:  BP 130/95  Pulse 91  Temp(Src) 97.6 F (36.4 C) (Oral)  Resp 20  Ht 6\' 3"  (1.905 m)  Wt 268 lb 15.4 oz (122 kg)  BMI 33.62 kg/m2  SpO2 95%  Discharge Labs:  Results for orders placed during the hospital encounter of 04/03/11 (from the past 24 hour(s))  GLUCOSE, CAPILLARY     Status: Abnormal   Collection  Time   04/06/11  3:46 PM      Component Value Range   Glucose-Capillary 170 (*) 70 - 99 (mg/dL)  GLUCOSE, CAPILLARY     Status: Abnormal   Collection Time   04/06/11  9:14 PM      Component Value Range   Glucose-Capillary 197 (*) 70 - 99 (mg/dL)   Comment 1  Notify RN    CBC     Status: Normal   Collection Time   04/07/11  5:40 AM      Component Value Range   WBC 7.5  4.0 - 10.5 (K/uL)   RBC 5.32  4.22 - 5.81 (MIL/uL)   Hemoglobin 14.4  13.0 - 17.0 (g/dL)   HCT 44.0  39.0 - 52.0 (%)   MCV 82.7  78.0 - 100.0 (fL)   MCH 27.1  26.0 - 34.0 (pg)   MCHC 32.7  30.0 - 36.0 (g/dL)   RDW 13.7  11.5 - 15.5 (%)   Platelets 230  150 - 400 (K/uL)  GLUCOSE, CAPILLARY     Status: Abnormal   Collection Time   04/07/11 11:08 AM      Component Value Range   Glucose-Capillary 220 (*) 70 - 99 (mg/dL)     We spent >30 minutes in discharging this patient. Signed: Brylen Wagar 04/07/2011, 11:56 AM

## 2011-04-07 NOTE — Progress Notes (Signed)
Nursing Note: Pt to be discharged per MD's order. Pt's IV has been taken out and cardiac monitor discontinued. Pt has received all discharge information. Pt verbalizes understanding. Will continue to monitor pt. Tanza [employer] laboratory technician.

## 2011-04-07 NOTE — Discharge Instructions (Signed)
Please continue to hold coumadin until you follow up with Dr. Henrene Pastor. Please schedule a follow up appointment with [facility] in 1 week .

## 2011-04-07 NOTE — Progress Notes (Signed)
Subjective:  He reports having intermittent abdominal pain located in the epigastrium. Denies any bloody diarrhea, hematemesis.  Objective: Vital signs in last 24 hours: Filed Vitals:   04/06/11 0953 04/06/11 1416 04/06/11 2110 04/07/11 0408  BP: 149/109 138/92 136/96 130/95  Pulse:  83 88 86  Temp:  97.5 F (36.4 C) 97.9 F (36.6 C) 97.6 F (36.4 C)  TempSrc:  Oral Oral Oral  Resp:  19 20 20   Height:      Weight:    268 lb 15.4 oz (122 kg)  SpO2:  99% 96% 95%   Physical Exam: BP 130/95  Pulse 86  Temp(Src) 97.6 F (36.4 C) (Oral)  Resp 20  Ht 6\' 3"  (1.905 m)  Wt 268 lb 15.4 oz (122 kg)  BMI 33.62 kg/m2  SpO2 95%  General Appearance:    Alert, cooperative, no distress, appears stated age  Head:    Normocephalic, without obvious abnormality, atraumatic  Eyes:    PERRL, conjunctiva/corneas clear, EOM's intact, fundi    benign, both eyes       Ears:    Normal TM's and external ear canals, both ears  Nose:   Nares normal, septum midline, mucosa normal, no drainage    or sinus tenderness  Throat:   Lips, mucosa, and tongue normal; teeth and gums normal  Neck:   Supple, symmetrical, trachea midline, no adenopathy;       thyroid:  No enlargement/tenderness/nodules; no carotid   bruit or JVD  Back:     Symmetric, no curvature, ROM normal, no CVA tenderness  Lungs:     Clear to auscultation bilaterally, respirations unlabored  Chest wall:    No tenderness or deformity  Heart:    Regular rate and rhythm, S1 and S2 normal, no murmur, rub   or gallop  Abdomen:     Soft, tender to palpation in the epigastrium, bowel sounds active all four quadrants,    no masses, no organomegaly  Genitalia:    Normal male without lesion, discharge or tenderness  Rectal:    Normal tone, normal prostate, no masses or tenderness;   guaiac negative stool  Extremities:   Extremities normal, atraumatic, no cyanosis or edema  Pulses:   2+ and symmetric all extremities  Skin:   Skin color, texture,  turgor normal, no rashes or lesions  Lymph nodes:   Cervical, supraclavicular, and axillary nodes normal  Neurologic:   CNII-XII intact. Normal strength, sensation and reflexes      throughout   Lab Results: Reviewed and documented in Electronic Record Micro Results: Reviewed and documented in Electronic Record Studies/Results: Reviewed and documented in Electronic Record Medications: I have reviewed the patient's current medications. Scheduled Meds:    . atorvastatin  20 mg Oral q1800  . digoxin  250 mcg Oral Daily  . dofetilide  250 mcg Oral BID  . furosemide  40 mg Oral Daily  . insulin aspart  0-15 Units Subcutaneous TID WC  . insulin glargine  15 Units Subcutaneous QHS  . lisinopril  10 mg Oral Daily  . metoprolol succinate  100 mg Oral Daily  . pantoprazole (PROTONIX) IV  40 mg Intravenous Q12H  . sodium chloride  3 mL Intravenous Q12H  . DISCONTD: insulin glargine  10 Units Subcutaneous QHS   Continuous Infusions:  PRN Meds:.sodium chloride, acetaminophen, morphine injection, nitroGLYCERIN, ondansetron, promethazine, sodium chloride  Assessment/Plan: 1. Hematemesis and melena: status post EGD- that's shows multiple serpiginous ulcers with biopsies pending (? rule out  MALT). He presented with episodes of hematemesis but has not had any further episodes of bloody vomitus during this hospitalization. Denies any further episodes of melena.Appreiate GI inputs! Hand H stable. - Continue to hold coumadin.  - D/c home on twice daily PPI.  3. Atrial fibrillation: His CHADS2 score is 3.  -Hold coumadin in the setting of GI bleed.  -Continue dofetilide and digoxin .  4. Diabetes: His AIC is around 13. Poorly controlled diabetes likely in the setting of medication non compliance. He was educated on the end organ damage that can result from poorly controlled DM. He voices understanding.His CBG's are well controlled on SSI and lantus.  - D/c on home regimen.  5. Chronic systolic  congestive heart failure, NYHA class 2; EF- 25% s/p ICD placement;  Continue lisinopril , metoprolol, lasix, lipitor, digoxin.  - daily weights and I and O's   6. DVT: SCD's   7. Dispo: D/c home today.       LOS: 4 days   Peggi Yono 04/07/2011, 9:34 AMMedical Student Daily Progress Note  Subjective: NAEON.  Hb is 14.5 this AM.  GI performed EGD yesterday which showed serpiginous ulcers that require r/o of MALT.  Denies SOB, CP, fevers, chills, but complains of continued spasmic pain the his abdomen.  He denies blood in his stool.  Objective: Vital signs in last 24 hours: Filed Vitals:   04/06/11 0953 04/06/11 1416 04/06/11 2110 04/07/11 0408  BP: 149/109 138/92 136/96 130/95  Pulse:  83 88 86  Temp:  97.5 F (36.4 C) 97.9 F (36.6 C) 97.6 F (36.4 C)  TempSrc:  Oral Oral Oral  Resp:  19 20 20   Height:      Weight:    268 lb 15.4 oz (122 kg)  SpO2:  99% 96% 95%   Weight change: 4.2 oz (0.119 kg)  Intake/Output Summary (Last 24 hours) at 04/07/11 0934 Last data filed at 04/07/11 0901  Gross per 24 hour  Intake   1133 ml  Output   2130 ml  Net   -997 ml   Physical Exam: Vitals Reviewed General: Cooperative obese male sitting in bed in some distress HEENT: normocephalic, no LAD appreciated.  MMM, OP clear CV: Loud S1, normal rate and rhythm. 5 inch scar midline on chest Pulmonary: CTAB, no wheezing Abdominal: Mildly tender in the epigrastrium.  BS 2+ in all quadrants.  Diffusely tender obese abdomen in upper R and L quadrants.  Extremities: Trace pitting edema appreciated bilaterally on anterior shin, good pedal pulses. Neuro: Alert and oriented x 3 Psych:  Appropriate  Lab Results: Basic Metabolic Panel:  Lab [id number] 04/04/11 0242  NA 140 138  K 4.0 3.7  CL 105 98  CO2 27 31  GLUCOSE 179* 188*  BUN 22 15  CREATININE 1.18 0.96  CALCIUM 8.5 8.6  MG 1.9 --  PHOS -- --   Liver Function Tests:  Lab 04/03/11 1526  AST 21  ALT 24  ALKPHOS 79    BILITOT 0.7  PROT 7.0  ALBUMIN 3.0*    Lab 04/03/11 1526  LIPASE 28  AMYLASE --   CBC:  Lab 04/07/11 0540 04/06/11 0420 04/03/11 1526  WBC 7.5 7.0 --  NEUTROABS -- -- 5.4  HGB 14.4 14.5 --  HCT 44.0 42.9 --  MCV 82.7 81.6 --  PLT 230 219 --   Cardiac Enzymes:  Lab 04/04/11 0900 04/04/11 0243 04/03/11 2112  CKTOTAL 91 111 131  CKMB 4.1* 4.3* 4.5*  CKMBINDEX -- -- --  TROPONINI <0.30 <0.30 <0.30   CBG:  Lab 04/06/11 2114 04/06/11 1546 04/06/11 1107 04/06/11 0625 04/05/11 2139 04/05/11 1549  GLUCAP 197* 170* 194* 175* 193* 164*   Hemoglobin A1C: No results found for this basename: HGBA1C in the last 168 hours Coagulation:  Lab 04/05/11 0615 04/03/11 1526  LABPROT 18.8* 23.3*  INR 1.54* 2.03*   Anemia Panel: No results found for this basename: VITAMINB12,FOLATE,FERRITIN,TIBC,IRON,RETICCTPCT in the last 168 hours Urine Drug Screen: Drugs of Abuse     Component Value Date/Time   LABOPIA NONE DETECTED 03/08/2008 0900   COCAINSCRNUR NONE DETECTED 03/08/2008 0900   LABBENZ NONE DETECTED 03/08/2008 0900   AMPHETMU NONE DETECTED 03/08/2008 0900   THCU NONE DETECTED 03/08/2008 0900   LABBARB  Value: NONE DETECTED        DRUG SCREEN FOR MEDICAL PURPOSES ONLY.  IF CONFIRMATION IS NEEDED FOR ANY PURPOSE, NOTIFY LAB WITHIN 5 DAYS.        LOWEST DETECTABLE LIMITS FOR URINE DRUG SCREEN Drug Class       Cutoff (ng/mL) Amphetamine      1000 Barbiturate      200 Benzodiazepine   [id number] Tricyclics       [id number] Opiates          300 Cocaine          300 THC              50 03/08/2008 0900    Alcohol Level: No results found for this basename: ETH:2 in the last 168 hours Urinalysis: No results found for this basename: COLORURINE:2,APPERANCEUR:2,LABSPEC:2,PHURINE:2,GLUCOSEU:2,HGBUR:2,BILIRUBINUR:2,KETONESUR:2,PROTEINUR:2,UROBILINOGEN:2,NITRITE:2,LEUKOCYTESUR:2 in the last 168 hours Misc. Labs: Hb of 12.9 in Jan 2013  Micro Results: No results found for this or any previous visit (from the past 240  hour(s)). Studies/Results: No results found. Medications: I have reviewed the patient's current medications. Scheduled Meds:    . atorvastatin  20 mg Oral q1800  . digoxin  250 mcg Oral Daily  . dofetilide  250 mcg Oral BID  . furosemide  40 mg Oral Daily  . insulin aspart  0-15 Units Subcutaneous TID WC  . insulin glargine  15 Units Subcutaneous QHS  . lisinopril  10 mg Oral Daily  . metoprolol succinate  100 mg Oral Daily  . pantoprazole (PROTONIX) IV  40 mg Intravenous Q12H  . sodium chloride  3 mL Intravenous Q12H  . DISCONTD: insulin glargine  10 Units Subcutaneous QHS   Continuous Infusions:   PRN Meds:.sodium chloride, acetaminophen, morphine injection, nitroGLYCERIN, ondansetron, promethazine, sodium chloride Assessment/Plan: Principal Problem:  1. Hematemesis; Patient report Active Problems:  AODM  DYSLIPIDEMIA  Essential hypertension, benign  Atrial fibrillation  Chronic systolic congestive heart failure, NYHA class 2  Long term current use of anticoagulant  Nonspecific abnormal finding in stool contents  Gastroenteritis  Mr. Jacquez is a 41 yo man who presented with a h/o 3 days of nausea, vomiting (10-15 x day) and hemetemsis.  He has not vomited since admission.  He had a positive FOBT in the ED, and his hemoglobin has remained steady between 14-16.  GI biopsied several ulcers in the stomach to r/o MALT.  We are keeping him at least until Monday to monitor his INR since we are holding Coumadin in the setting of GI bleeding.  His wife, Javion Mastalski can be reached at [phone] and brother Gwyndolyn Saxon at [phone].  1.  Hematemesis - Given the history of present illness, particularly 1 styrofoam cup full per patient en route to hospital, a non-specific chest  xray and dropping Hemoglobin, we suspected this patient may have an upper GI bleed from Ulcer vs. Mallory weiss tears vs. Mucosal disease.  EGD confirmed gastric ulcers. He is somewhat tender over the epigastrium and was  diffusely tender in all 4 abdominal quadrants.  - Hb stable at 14.5, will transfuse if drops below 8, but unlikely as there has not been hematemesis since admission.   - FOBT positive  - Protonix 40mg  IV BID  - GI Consult - they are following this patient.  - Advancing diet  - Zofran prn nausea, Morphine prn pain. Promethazine prn nausea dry heaves  - CBC Q24H  2.  Uncontrolled insulin dependent DM2 - Pt has poor sugar control and is not currently taking any PO DM medications.  He takes 5 units of novolog with meals and 10 units of Lantus as night.  Does not monitor sugars or diet at home. Last A1c of 12.9 in January.  - Moderate SSI, will consider more stringent SSI if poor control on this regiment. He has sugars ranging from 139-205 since yesterday, will keep on moderate on SSI  - Glargine 10 units QD  3.  CHF with EF of 0000000 - Complicated cardiac history as noted in HPI with PAF, AICD, and htn.  - Digoxin 263mcg QD  - Dofetilide 294mcg BID, with maintaining K > 4.0 per pharmacy.  Will order potassium PO supplementation as needed.  4.  Paroxsymal Atrial Fibrillation - Mr. Valerio has a complicated cardiac history and has an AICD in place.  Coumadin dosing at home is 20mg  M/Th and 10mg  every other day.  He last took his coumadin dose was 20mg  on Tuesday evening.  INR pending for today.  - We will hold coumadin in the setting of upper GI bleed, and check his daily PT/INR.  Has not been corrected with Vitamin K, etc. As his bleeding is stopped.  - Patient has AICD in place  - CHADS2 score is 3, with 5.9% stroke risk/year.   5.  Dyslipidemia - He has an LDL of 130 from his outpatient clinic visits here at [facility], but has yet to be started on a statin because of lack of follow up.  We will initiate statin therapy of Lipitor 20mg  QD with the goal of reducing his LDL to less than 100.  - Initiate Lipitor 20mg  QD  6.  Hypertension - He has a well documented history of hypertension.  His pressure has  been under good control here, 130s/90s.  We will Continue his home regimen.  - Continue Lasix 40mg  QD  - Continue Lisinopril 10mg  QD  - Continue Toprol-XL 100mg  QD  7.  Diet/Fluids - Advancing as tolerated., NSL  8. VTE - SCDs  9.  Disposition - Waiting for pathology result on biopsy, may need surgery next week if MALT.   LOS: 4 days   This is a Careers information officer Note.  The care of the patient was discussed with Dr. Doug Sou and the assessment and plan formulated with their assistance.  Please see their attached note for official documentation of the daily encounter.  [place] 04/07/2011, 9:34 AM

## 2011-04-08 ENCOUNTER — Telehealth: Payer: Self-pay | Admitting: Dietician

## 2011-04-08 ENCOUNTER — Encounter: Payer: Self-pay | Admitting: Dietician

## 2011-04-08 ENCOUNTER — Ambulatory Visit: Payer: Self-pay

## 2011-04-08 ENCOUNTER — Encounter: Payer: Self-pay | Admitting: Internal Medicine

## 2011-04-08 LAB — GLUCOSE, CAPILLARY: Glucose-Capillary: 161 mg/dL — ABNORMAL HIGH (ref 70–99)

## 2011-04-09 ENCOUNTER — Encounter: Payer: Self-pay | Admitting: Internal Medicine

## 2011-04-09 ENCOUNTER — Encounter (HOSPITAL_COMMUNITY): Payer: Self-pay | Admitting: Internal Medicine

## 2011-04-11 NOTE — Telephone Encounter (Signed)
Assisted patient in rescheduling his hospital follow up appointments.

## 2011-04-13 ENCOUNTER — Encounter: Payer: Self-pay | Admitting: Internal Medicine

## 2011-04-13 NOTE — Progress Notes (Signed)
I spoke with Dr. Henrene Pastor over the phone to discuss his biopsy report. His Biopsy report was negative for dysplasia or malignancy.    He said that we can restart Coumadin on the upcoming clinic appointment this Tuesday, March 19.  Thanks, [employer]

## 2011-04-16 ENCOUNTER — Ambulatory Visit: Payer: Self-pay | Admitting: Dietician

## 2011-04-16 ENCOUNTER — Encounter: Payer: Self-pay | Admitting: Internal Medicine

## 2011-05-09 ENCOUNTER — Ambulatory Visit: Payer: Self-pay | Admitting: Internal Medicine

## 2011-05-09 NOTE — Progress Notes (Signed)
Addended by: Hulan Fray on: 05/09/2011 06:54 PM   Modules accepted: Orders

## 2011-07-02 ENCOUNTER — Ambulatory Visit: Payer: Self-pay | Admitting: Cardiovascular Disease

## 2011-07-02 DIAGNOSIS — I4891 Unspecified atrial fibrillation: Secondary | ICD-10-CM

## 2011-07-02 DIAGNOSIS — Z7901 Long term (current) use of anticoagulants: Secondary | ICD-10-CM

## 2011-07-05 ENCOUNTER — Emergency Department (HOSPITAL_COMMUNITY): Payer: Medicaid Other

## 2011-07-05 ENCOUNTER — Encounter (HOSPITAL_COMMUNITY): Payer: Self-pay | Admitting: *Deleted

## 2011-07-05 ENCOUNTER — Emergency Department (HOSPITAL_COMMUNITY)
Admission: EM | Admit: 2011-07-05 | Discharge: 2011-07-05 | Disposition: A | Discharge status: Home / Self Care | Payer: Medicaid Other | Attending: Emergency Medicine | Admitting: Emergency Medicine

## 2011-07-05 DIAGNOSIS — I10 Essential (primary) hypertension: Secondary | ICD-10-CM | POA: Insufficient documentation

## 2011-07-05 DIAGNOSIS — I428 Other cardiomyopathies: Secondary | ICD-10-CM | POA: Insufficient documentation

## 2011-07-05 DIAGNOSIS — R1110 Vomiting, unspecified: Secondary | ICD-10-CM | POA: Insufficient documentation

## 2011-07-05 DIAGNOSIS — K7689 Other specified diseases of liver: Secondary | ICD-10-CM | POA: Insufficient documentation

## 2011-07-05 DIAGNOSIS — I4891 Unspecified atrial fibrillation: Secondary | ICD-10-CM | POA: Insufficient documentation

## 2011-07-05 DIAGNOSIS — N3090 Cystitis, unspecified without hematuria: Secondary | ICD-10-CM | POA: Insufficient documentation

## 2011-07-05 DIAGNOSIS — E119 Type 2 diabetes mellitus without complications: Secondary | ICD-10-CM | POA: Insufficient documentation

## 2011-07-05 DIAGNOSIS — E669 Obesity, unspecified: Secondary | ICD-10-CM | POA: Insufficient documentation

## 2011-07-05 DIAGNOSIS — I5022 Chronic systolic (congestive) heart failure: Secondary | ICD-10-CM | POA: Insufficient documentation

## 2011-07-05 DIAGNOSIS — I2510 Atherosclerotic heart disease of native coronary artery without angina pectoris: Secondary | ICD-10-CM | POA: Insufficient documentation

## 2011-07-05 DIAGNOSIS — Z96659 Presence of unspecified artificial knee joint: Secondary | ICD-10-CM | POA: Insufficient documentation

## 2011-07-05 DIAGNOSIS — M109 Gout, unspecified: Secondary | ICD-10-CM | POA: Insufficient documentation

## 2011-07-05 DIAGNOSIS — Z794 Long term (current) use of insulin: Secondary | ICD-10-CM | POA: Insufficient documentation

## 2011-07-05 DIAGNOSIS — Z7901 Long term (current) use of anticoagulants: Secondary | ICD-10-CM | POA: Insufficient documentation

## 2011-07-05 DIAGNOSIS — E785 Hyperlipidemia, unspecified: Secondary | ICD-10-CM | POA: Insufficient documentation

## 2011-07-05 DIAGNOSIS — Z79899 Other long term (current) drug therapy: Secondary | ICD-10-CM | POA: Insufficient documentation

## 2011-07-05 DIAGNOSIS — Z6830 Body mass index (BMI) 30.0-30.9, adult: Secondary | ICD-10-CM | POA: Insufficient documentation

## 2011-07-05 LAB — COMPREHENSIVE METABOLIC PANEL
ALT: 15 U/L (ref 0–53)
AST: 16 U/L (ref 0–37)
Albumin: 3.2 g/dL — ABNORMAL LOW (ref 3.5–5.2)
Alkaline Phosphatase: 87 U/L (ref 39–117)
CO2: 24 mEq/L (ref 19–32)
Chloride: 104 mEq/L (ref 96–112)
GFR calc non Af Amer: >90 mL/min (ref >90)
Potassium: 3.9 mEq/L (ref 3.5–5.1)
Total Bilirubin: 0.6 mg/dL (ref 0.3–1.2)

## 2011-07-05 LAB — URINALYSIS, ROUTINE W REFLEX MICROSCOPIC
Glucose, UA: NEGATIVE mg/dL
Ketones, ur: NEGATIVE mg/dL
Nitrite: NEGATIVE
Protein, ur: >300 mg/dL — AB
pH: 6 (ref 5.0–8.0)

## 2011-07-05 LAB — CBC
HCT: 48.5 % (ref 39.0–52.0)
MCH: 27.2 pg (ref 26.0–34.0)
MCHC: 33.8 g/dL (ref 30.0–36.0)
MCV: 80.3 fL (ref 78.0–100.0)
Platelets: 241 10*3/uL (ref 150–400)
RDW: 15 % (ref 11.5–15.5)

## 2011-07-05 LAB — DIFFERENTIAL
Basophils Absolute: 0 10*3/uL (ref 0.0–0.1)
Basophils Relative: 0 % (ref 0–1)
Eosinophils Absolute: 0.1 10*3/uL (ref 0.0–0.7)
Eosinophils Relative: 2 % (ref 0–5)
Lymphs Abs: 1.8 10*3/uL (ref 0.7–4.0)
Monocytes Absolute: 0.6 10*3/uL (ref 0.1–1.0)

## 2011-07-05 LAB — LIPASE, BLOOD: Lipase: 40 U/L (ref 11–59)

## 2011-07-05 LAB — URINE MICROSCOPIC-ADD ON

## 2011-07-05 MED ORDER — ONDANSETRON HCL 4 MG PO TABS: 4.0000 mg | ORAL_TABLET | Freq: Four times a day (QID) | ORAL | Status: AC

## 2011-07-05 MED ORDER — DIPHENHYDRAMINE HCL 50 MG/ML IJ SOLN: 25.0000 mg | Freq: Once | INTRAMUSCULAR | Status: DC

## 2011-07-05 MED ORDER — ONDANSETRON 4 MG PO TBDP
ORAL_TABLET | ORAL | Status: AC
  Administered 2011-07-05: 4 mg via ORAL
  Filled 2011-07-05: qty 1

## 2011-07-05 MED ORDER — CIPROFLOXACIN IN D5W 400 MG/200ML IV SOLN
400.0000 mg | Freq: Once | INTRAVENOUS | Status: AC
  Administered 2011-07-05: 400 mg via INTRAVENOUS
  Filled 2011-07-05: qty 200

## 2011-07-05 MED ORDER — ONDANSETRON HCL 4 MG/2ML IJ SOLN
4.0000 mg | Freq: Once | INTRAMUSCULAR | Status: AC
  Administered 2011-07-05: 4 mg via INTRAVENOUS
  Filled 2011-07-05: qty 2

## 2011-07-05 MED ORDER — SODIUM CHLORIDE 0.9 % IV BOLUS (SEPSIS)
1000.0000 mL | Freq: Once | INTRAVENOUS | Status: AC
  Administered 2011-07-05: 1000 mL via INTRAVENOUS

## 2011-07-05 MED ORDER — DIPHENHYDRAMINE HCL 50 MG/ML IJ SOLN
25.0000 mg | Freq: Once | INTRAMUSCULAR | Status: AC
  Administered 2011-07-05: 25 mg via INTRAVENOUS
  Filled 2011-07-05: qty 1

## 2011-07-05 MED ORDER — HYDROMORPHONE HCL PF 1 MG/ML IJ SOLN
1.0000 mg | Freq: Once | INTRAMUSCULAR | Status: AC
  Administered 2011-07-05: 1 mg via INTRAVENOUS
  Filled 2011-07-05: qty 1

## 2011-07-05 MED ORDER — IOHEXOL 300 MG/ML  SOLN
100.0000 mL | Freq: Once | INTRAMUSCULAR | Status: AC | PRN
  Administered 2011-07-05: 100 mL via INTRAVENOUS

## 2011-07-05 MED ORDER — OXYCODONE-ACETAMINOPHEN 5-325 MG PO TABS: 1.0000 | ORAL_TABLET | Freq: Four times a day (QID) | ORAL | Status: DC | PRN

## 2011-07-05 MED ORDER — ONDANSETRON 4 MG PO TBDP
4.0000 mg | ORAL_TABLET | Freq: Once | ORAL | Status: AC
  Administered 2011-07-05: 4 mg via ORAL

## 2011-07-05 MED ORDER — IOHEXOL 300 MG/ML  SOLN
20.0000 mL | INTRAMUSCULAR | Status: AC
  Administered 2011-07-05 (×2): 20 mL via ORAL

## 2011-07-05 MED ORDER — CIPROFLOXACIN HCL 500 MG PO TABS: 500.0000 mg | ORAL_TABLET | Freq: Two times a day (BID) | ORAL | Status: DC

## 2011-07-05 NOTE — ED Provider Notes (Signed)
History     CSN: [id number]  Arrival date & time 07/05/11  [id number]   First MD Initiated Contact with Patient 07/05/11 [phone]      Chief Complaint  Patient presents with  . Emesis    (Consider location/radiation/quality/duration/timing/severity/associated sxs/prior treatment) HPI  Patient presents [facility] with complaints of bilateral abdominal pain over the past month that has been gradually worsening. He states that it started in the suprapubic region but today his belly hurts all over, he feels distended and has episodes of nausea and vomiting. He denies being evaluated for these symptoms prior to being seen in the ED today. HE admits to having a history of PUD. After episodes of retching, the patient does admit to some streaks of blood in his vomit.  The patients tachycardic at the time but othewrise vital signs are stable.  Past Medical History  Diagnosis Date  . Dilated cardiomyopathy     status post ICD placement in 2008 c/b tearing at the atrial junction resulting in tamponade and urgent thoracotomy  . Gout, unspecified   . Dyslipidemia   . Hypertension   . Diabetes uncomplicated adult-type II     on insulin therapy, a1c 12.9 in 01/2011  . Chronic systolic heart failure     2D echo (123456) - Systolic function severely reduced., LV EF 20-25%. Akinesis of apical and anteroseptal mycoardium.  last 2D echo (11/2008 ), LV EF 25-30%, diffuse hypokinesis, and grade 1 diastolic dysfunction.  Marland Kitchen CAD (coronary artery disease)     Cardiac cath (08/19/2000) - Nonobstructive, 40% stenosis of LAD,.   . Paroxysmal atrial fibrillation     on chronic coumadin, goal INR 2-3. status post direct current  . Obesity (BMI 30.0-34.9)   . Hepatic steatosis 2011    seen on ultrasound.     Past Surgical History  Procedure Date  . Icd placement   . Cardiac pacemaker placement   . Left total knee replacement   . Cholecystectomy   . Esophagogastroduodenoscopy 04/05/2011    Procedure:  ESOPHAGOGASTRODUODENOSCOPY (EGD);  Surgeon: Irene Shipper, MD;  Location: [facility] ENDOSCOPY;  Service: Endoscopy;  Laterality: N/A;    Family History  Problem Relation Age of Onset  . Diabetes Mother   . Coronary artery disease Mother 37    Died of MI at 33  . Diabetes Father   . Coronary artery disease Father 71    Died in a house fire   . Gout Mother     History  Substance Use Topics  . Smoking status: Never Smoker   . Smokeless tobacco: Not on file  . Alcohol Use: No      Review of Systems   HEENT: denies blurry vision or change in hearing PULMONARY: Denies difficulty breathing and SOB CARDIAC: denies chest pain or heart palpitations MUSCULOSKELETAL:  denies being unable to ambulate ABDOMEN AL: denies diarrhea GU: denies loss of bowel or urinary control NEURO: denies numbness and tingling in extremities SKIN: no new rashes PSYCH: patient behavior is normal NECK: No neck pain    Allergies  Penicillins and Orange fruit  Home Medications   Current Outpatient Rx  Name Route Sig Dispense Refill  . NITROGLYCERIN 0.4 MG SL SUBL Sublingual Place 0.4 mg under the tongue every 5 (five) minutes as needed. For chest pain.    Marland Kitchen CIPROFLOXACIN HCL 500 MG PO TABS Oral Take 1 tablet (500 mg total) by mouth 2 (two) times daily. 28 tablet 0  . COLCHICINE 0.6 MG PO TABS  Oral Take 0.6 mg by mouth daily as needed. For gout flare ups    . DIGOXIN 0.25 MG PO TABS Oral Take 1 tablet (250 mcg total) by mouth daily. 30 tablet 6  . DOFETILIDE 250 MCG PO CAPS Oral Take 250 mcg by mouth 2 (two) times daily.    . FUROSEMIDE 40 MG PO TABS Oral Take 1 tablet (40 mg total) by mouth 2 (two) times daily. 60 tablet 6  . INSULIN ASPART 100 UNIT/ML [place] SOLN Subcutaneous Inject 5 Units into the skin 3 (three) times daily before meals.    . INSULIN GLARGINE 100 UNIT/ML [place] SOLN Subcutaneous Inject 15 Units into the skin at bedtime.    Marland Kitchen LISINOPRIL 10 MG PO TABS Oral Take 1 tablet (10 mg total) by mouth  daily. 30 tablet 6  . METOPROLOL SUCCINATE ER 100 MG PO TB24 Oral Take 1 tablet (100 mg total) by mouth daily. Take with or immediately following a meal. 30 tablet 6  . ONDANSETRON HCL 4 MG PO TABS Oral Take 1 tablet (4 mg total) by mouth every 6 (six) hours. 12 tablet 0  . OXYCODONE-ACETAMINOPHEN 5-325 MG PO TABS Oral Take 1 tablet by mouth every 6 (six) hours as needed for pain. 15 tablet 0  . PANTOPRAZOLE SODIUM 40 MG PO TBEC Oral Take 1 tablet (40 mg total) by mouth 2 (two) times daily. 60 tablet 1  . SIMVASTATIN 40 MG PO TABS Oral Take 1 tablet (40 mg total) by mouth every evening. 30 tablet 3    BP 113/80  Pulse 102  Temp(Src) 97.6 F (36.4 C) (Oral)  Resp 18  Ht 6\' 3"  (1.905 m)  Wt 235 lb (106.595 kg)  BMI 29.37 kg/m2  SpO2 95%  Physical Exam  Nursing note and vitals reviewed. Constitutional: He appears well-developed and well-nourished. No distress.  HENT:  Head: Normocephalic and atraumatic.  Eyes: Pupils are equal, round, and reactive to light.  Neck: Normal range of motion. Neck supple.  Cardiovascular: Normal rate and regular rhythm.   Pulmonary/Chest: Effort normal.  Abdominal: Soft. Bowel sounds are normal. He exhibits distension. He exhibits no fluid wave and no pulsatile midline mass. There is tenderness (mild to moderate tenderness to palpation). There is no rigidity, no rebound, no guarding, no CVA tenderness and negative Murphy's sign.  Neurological: He is alert.  Skin: Skin is warm and dry.    ED Course  Procedures (including critical care time)  Labs Reviewed  COMPREHENSIVE METABOLIC PANEL - Abnormal; Notable for the following:    Glucose, Bld 196 (*)    Albumin 3.2 (*)    All other components within normal limits  CBC - Abnormal; Notable for the following:    RBC 6.04 (*)    All other components within normal limits  URINALYSIS, ROUTINE W REFLEX MICROSCOPIC - Abnormal; Notable for the following:    Color, Urine AMBER (*) BIOCHEMICALS MAY BE AFFECTED  BY COLOR   APPearance CLOUDY (*)    Hgb urine dipstick LARGE (*)    Bilirubin Urine SMALL (*)    Protein, ur >300 (*)    Leukocytes, UA TRACE (*)    All other components within normal limits  DIFFERENTIAL  LIPASE, BLOOD  URINE MICROSCOPIC-ADD ON  URINE CULTURE  LAB REPORT - SCANNED        Results for orders placed during the hospital encounter of 07/05/11  COMPREHENSIVE METABOLIC PANEL      Component Value Range   Sodium 140  135 -  145 (mEq/L)   Potassium 3.9  3.5 - 5.1 (mEq/L)   Chloride 104  96 - 112 (mEq/L)   CO2 24  19 - 32 (mEq/L)   Glucose, Bld 196 (*) 70 - 99 (mg/dL)   BUN 19  6 - 23 (mg/dL)   Creatinine, Ser 0.97  0.50 - 1.35 (mg/dL)   Calcium 9.3  8.4 - 10.5 (mg/dL)   Total Protein 6.5  6.0 - 8.3 (g/dL)   Albumin 3.2 (*) 3.5 - 5.2 (g/dL)   AST 16  0 - 37 (U/L)   ALT 15  0 - 53 (U/L)   Alkaline Phosphatase 87  39 - 117 (U/L)   Total Bilirubin 0.6  0.3 - 1.2 (mg/dL)   GFR calc non Af Amer >90  >90 (mL/min)   GFR calc Af Amer >90  >90 (mL/min)  CBC      Component Value Range   WBC 7.8  4.0 - 10.5 (K/uL)   RBC 6.04 (*) 4.22 - 5.81 (MIL/uL)   Hemoglobin 16.4  13.0 - 17.0 (g/dL)   HCT 48.5  39.0 - 52.0 (%)   MCV 80.3  78.0 - 100.0 (fL)   MCH 27.2  26.0 - 34.0 (pg)   MCHC 33.8  30.0 - 36.0 (g/dL)   RDW 15.0  11.5 - 15.5 (%)   Platelets 241  150 - 400 (K/uL)  DIFFERENTIAL      Component Value Range   Neutrophils Relative 68  43 - 77 (%)   Neutro Abs 5.3  1.7 - 7.7 (K/uL)   Lymphocytes Relative 23  12 - 46 (%)   Lymphs Abs 1.8  0.7 - 4.0 (K/uL)   Monocytes Relative 8  3 - 12 (%)   Monocytes Absolute 0.6  0.1 - 1.0 (K/uL)   Eosinophils Relative 2  0 - 5 (%)   Eosinophils Absolute 0.1  0.0 - 0.7 (K/uL)   Basophils Relative 0  0 - 1 (%)   Basophils Absolute 0.0  0.0 - 0.1 (K/uL)  LIPASE, BLOOD      Component Value Range   Lipase 40  11 - 59 (U/L)  URINALYSIS, ROUTINE W REFLEX MICROSCOPIC      Component Value Range   Color, Urine AMBER (*) YELLOW     APPearance CLOUDY (*) CLEAR    Specific Gravity, Urine 1.026  1.005 - 1.030    pH 6.0  5.0 - 8.0    Glucose, UA NEGATIVE  NEGATIVE (mg/dL)   Hgb urine dipstick LARGE (*) NEGATIVE    Bilirubin Urine SMALL (*) NEGATIVE    Ketones, ur NEGATIVE  NEGATIVE (mg/dL)   Protein, ur >300 (*) NEGATIVE (mg/dL)   Urobilinogen, UA 1.0  0.0 - 1.0 (mg/dL)   Nitrite NEGATIVE  NEGATIVE    Leukocytes, UA TRACE (*) NEGATIVE   URINE MICROSCOPIC-ADD ON      Component Value Range   Squamous Epithelial / LPF RARE  RARE    WBC, UA 3-6  <3 (WBC/hpf)   RBC / HPF 3-6  <3 (RBC/hpf)   Bacteria, UA RARE  RARE    Urine-Other FEW YEAST    URINE CULTURE      Component Value Range   Specimen Description URINE, RANDOM     Special Requests CX ADDED 07/05/11 1600     Culture  Setup Time [id number]     Colony Count NO GROWTH     Culture NO GROWTH     Report Status 07/07/2011 FINAL     Ct  Abdomen Pelvis W Contrast  07/05/2011  *RADIOLOGY REPORT*  Clinical Data: Diffuse abdominal pain for 2-3 days, nausea, vomiting, diarrhea  CT ABDOMEN AND PELVIS WITH CONTRAST  Technique:  Multidetector CT imaging of the abdomen and pelvis was performed following the standard protocol during bolus administration of intravenous contrast.  Contrast: 129mL OMNIPAQUE IOHEXOL 300 MG/ML  SOLN  Comparison: Chest and acute abdomen films of 04/03/2011 and CT abdomen and pelvis of 03/28/2008  Findings: There do appear to be very small pleural effusions layering posteriorly.  No infiltrate is seen.  Moderate cardiomegaly is again noted.  There is reflux of contrast into the IVC and hepatic veins which may indicate a degree of right heart dysfunction.  The liver enhances with no focal abnormality. Surgical clips are present from prior cholecystectomy.  The pancreas is normal in size and the pancreatic duct is not dilated. The adrenal glands and spleen are unremarkable.  The stomach is moderately distended with no significant abnormality noted.  There is a  small amount of ascites which is primarily perihepatic and perisplenic in location.  The kidneys enhance with no calculus or mass and no hydronephrosis is seen.  The abdominal aorta is normal in caliber.  The mesenteric vasculature is patent.  Only small retroperitoneal nodes and mesenteric nodes are present.  The urinary bladder is thick-walled but not well distended.  This may indicate a degree of bladder outlet obstruction versus edema as with cystitis.  The prostate is within normal limits in size however.  Some ascites does layer within the pelvis as well.  No abnormality of the colon is seen and the terminal ileum and appendix are unremarkable.  No adenopathy is seen within the pelvis.  No bony abnormality is noted.  IMPRESSION:  1.  A small amount of ascites is noted within the peritoneal cavity.  With cardiomegaly, evidence of right heart dysfunction, and small pleural effusions, this ascites may be due to congestive heart failure. 2.  No evidence of cirrhosis is seen by CT. 3.  Decompressed but somewhat thick-walled urinary bladder.  Cannot exclude bladder outlet abnormality versus cystitis.  Correlate clinically.  Original Report Authenticated By: Joretta Bachelor, M.D.          No results found.   1. Cystitis       MDM  Patients pain and nausea controllable in the ED.  Patients CT and physical exam are consistent with cystitis.AFter discussing with Dr. Roxanne Mins,  Cystitis in a male is  Not common. The patient treated with 400 mg IV Cipro in ED and also given a long PO Rx of Cipro as well.   I have discussed results with patient and answered questions. I have stressed the importance  of following up with the urologist for a cytoscopy.  Patients abdomen re-evaluated before discharge, no longer having tenderness. Pain medication and Zofran Prescribed with the Cipro antibiotics.  Pt has been advised of the symptoms that warrant their return to the ED. Patient has voiced understanding and has  agreed to follow-up with the PCP or specialist.       Linus Mako, [place] 07/08/11 [phone]

## 2011-07-05 NOTE — ED Notes (Signed)
Patient reports onset of n/v today and now reports he has had bright red blood.  Patient reports he has hx of peptic ulcer.  Patient reports he is having stomach pain and lower abd pain.   Patient reports normal bm at 0600

## 2011-07-05 NOTE — Discharge Instructions (Signed)
Information printed off and attached.

## 2011-07-05 NOTE — ED Notes (Signed)
Pt presents to department for evaluation of epigastric and lower abdominal pain. Also states N/V this morning with bright red blood in emesis. 10/10 pain at the time. Abdomen soft and non tender. Bowel sounds present all quadrants. No active vomiting at the time. Pt conscious alert and oriented x4. No signs of distress at the time.

## 2011-07-07 LAB — URINE CULTURE: Culture: NO GROWTH

## 2011-07-08 ENCOUNTER — Other Ambulatory Visit: Payer: Self-pay | Admitting: Internal Medicine

## 2011-07-09 NOTE — ED Provider Notes (Signed)
Medical screening examination/treatment/procedure(s) were performed by non-physician practitioner and as supervising physician I was immediately available for consultation/collaboration.   Delora Fuel, MD [id number]

## 2011-07-10 ENCOUNTER — Telehealth: Payer: Self-pay | Admitting: Internal Medicine

## 2011-07-10 ENCOUNTER — Other Ambulatory Visit: Payer: Self-pay | Admitting: Cardiology

## 2011-07-10 ENCOUNTER — Telehealth: Payer: Self-pay | Admitting: Cardiology

## 2011-07-10 MED ORDER — DOFETILIDE 250 MCG PO CAPS: 250.0000 ug | ORAL_CAPSULE | Freq: Two times a day (BID) | ORAL | Status: DC

## 2011-07-10 NOTE — Telephone Encounter (Signed)
Called patient and spoke with patient told patient that we would be unable to refill Tikosyn until he made follow up appointment gave him the number to call [phone]) to make appointment. I was told by Janan Halter, RN for Dr. Lovena Le to refill medicine and give one additional refill so patient will have enough medicine to get him to his appointment. I called patient back and left a message on his voicemail that we were going to go ahead and refill the medicine with one additional refill. Told him to call Elisabeth Pigeon back at [phone] if he had any questions.   Elisabeth Pigeon, CMA

## 2011-07-10 NOTE — Telephone Encounter (Signed)
Pt needs to know when he needs to come in for and appt he was told his tykosin was called in and he is waiting on the call

## 2011-07-10 NOTE — Telephone Encounter (Signed)
Patient's medication was called in  He needs to schedule an appointment

## 2011-07-11 ENCOUNTER — Telehealth: Payer: Self-pay | Admitting: Internal Medicine

## 2011-07-11 NOTE — Telephone Encounter (Signed)
Pt has been out of his medications for 1 1/2 months and he says he has some fluid retention but his ICD has not gone off.  I let him know I would have to discuss with Dr Lovena Le what he wants to do in regards to restarting Tikosyn

## 2011-07-11 NOTE — Telephone Encounter (Signed)
New msg Pt wants to know what to do about tikosyn. Please call

## 2011-07-11 NOTE — Telephone Encounter (Signed)
lmom for pt that his medication was called in and he can go pick up.  He does need to make an appointment

## 2011-07-11 NOTE — Telephone Encounter (Signed)
New msg Pt was calling back again about tikosyn. He had some more questions

## 2011-07-16 ENCOUNTER — Telehealth: Payer: Self-pay | Admitting: Internal Medicine

## 2011-07-16 ENCOUNTER — Inpatient Hospital Stay (HOSPITAL_COMMUNITY)
Admission: EM | Admit: 2011-07-16 | Discharge: 2011-07-22 | DRG: 292 | Disposition: A | Discharge status: Home / Self Care | Payer: Medicaid Other | Attending: Internal Medicine | Admitting: Internal Medicine

## 2011-07-16 ENCOUNTER — Encounter (HOSPITAL_COMMUNITY): Payer: Self-pay | Admitting: *Deleted

## 2011-07-16 ENCOUNTER — Emergency Department (HOSPITAL_COMMUNITY): Payer: Medicaid Other

## 2011-07-16 DIAGNOSIS — Z96659 Presence of unspecified artificial knee joint: Secondary | ICD-10-CM

## 2011-07-16 DIAGNOSIS — G459 Transient cerebral ischemic attack, unspecified: Secondary | ICD-10-CM | POA: Diagnosis not present

## 2011-07-16 DIAGNOSIS — R0602 Shortness of breath: Secondary | ICD-10-CM

## 2011-07-16 DIAGNOSIS — Z7901 Long term (current) use of anticoagulants: Secondary | ICD-10-CM

## 2011-07-16 DIAGNOSIS — I428 Other cardiomyopathies: Secondary | ICD-10-CM | POA: Diagnosis present

## 2011-07-16 DIAGNOSIS — I10 Essential (primary) hypertension: Secondary | ICD-10-CM | POA: Diagnosis present

## 2011-07-16 DIAGNOSIS — IMO0001 Reserved for inherently not codable concepts without codable children: Secondary | ICD-10-CM | POA: Diagnosis present

## 2011-07-16 DIAGNOSIS — Z8711 Personal history of peptic ulcer disease: Secondary | ICD-10-CM

## 2011-07-16 DIAGNOSIS — E669 Obesity, unspecified: Secondary | ICD-10-CM

## 2011-07-16 DIAGNOSIS — M109 Gout, unspecified: Secondary | ICD-10-CM | POA: Diagnosis present

## 2011-07-16 DIAGNOSIS — I5023 Acute on chronic systolic (congestive) heart failure: Principal | ICD-10-CM

## 2011-07-16 DIAGNOSIS — I2510 Atherosclerotic heart disease of native coronary artery without angina pectoris: Secondary | ICD-10-CM | POA: Diagnosis present

## 2011-07-16 DIAGNOSIS — I5043 Acute on chronic combined systolic (congestive) and diastolic (congestive) heart failure: Secondary | ICD-10-CM

## 2011-07-16 DIAGNOSIS — Z95811 Presence of heart assist device: Secondary | ICD-10-CM | POA: Diagnosis present

## 2011-07-16 DIAGNOSIS — Z9689 Presence of other specified functional implants: Secondary | ICD-10-CM | POA: Diagnosis present

## 2011-07-16 DIAGNOSIS — Z8719 Personal history of other diseases of the digestive system: Secondary | ICD-10-CM

## 2011-07-16 DIAGNOSIS — Z6830 Body mass index (BMI) 30.0-30.9, adult: Secondary | ICD-10-CM

## 2011-07-16 DIAGNOSIS — Z880 Allergy status to penicillin: Secondary | ICD-10-CM

## 2011-07-16 DIAGNOSIS — I4891 Unspecified atrial fibrillation: Secondary | ICD-10-CM

## 2011-07-16 DIAGNOSIS — K253 Acute gastric ulcer without hemorrhage or perforation: Secondary | ICD-10-CM

## 2011-07-16 DIAGNOSIS — I509 Heart failure, unspecified: Secondary | ICD-10-CM

## 2011-07-16 DIAGNOSIS — Z9089 Acquired absence of other organs: Secondary | ICD-10-CM

## 2011-07-16 DIAGNOSIS — E785 Hyperlipidemia, unspecified: Secondary | ICD-10-CM

## 2011-07-16 DIAGNOSIS — R079 Chest pain, unspecified: Secondary | ICD-10-CM | POA: Diagnosis present

## 2011-07-16 DIAGNOSIS — Z8249 Family history of ischemic heart disease and other diseases of the circulatory system: Secondary | ICD-10-CM

## 2011-07-16 DIAGNOSIS — E119 Type 2 diabetes mellitus without complications: Secondary | ICD-10-CM

## 2011-07-16 DIAGNOSIS — Z794 Long term (current) use of insulin: Secondary | ICD-10-CM

## 2011-07-16 DIAGNOSIS — I255 Ischemic cardiomyopathy: Secondary | ICD-10-CM | POA: Diagnosis present

## 2011-07-16 DIAGNOSIS — Z95810 Presence of automatic (implantable) cardiac defibrillator: Secondary | ICD-10-CM

## 2011-07-16 HISTORY — DX: Heart failure, unspecified: I50.9

## 2011-07-16 HISTORY — DX: Presence of cardiac pacemaker: Z95.0

## 2011-07-16 HISTORY — DX: Shortness of breath: R06.02

## 2011-07-16 HISTORY — DX: Acute myocardial infarction, unspecified: I21.9

## 2011-07-16 HISTORY — DX: Angina pectoris, unspecified: I20.9

## 2011-07-16 HISTORY — DX: Presence of automatic (implantable) cardiac defibrillator: Z95.810

## 2011-07-16 HISTORY — DX: Peptic ulcer, site unspecified, unspecified as acute or chronic, without hemorrhage or perforation: K27.9

## 2011-07-16 HISTORY — DX: Malignant (primary) neoplasm, unspecified: C80.1

## 2011-07-16 LAB — COMPREHENSIVE METABOLIC PANEL
ALT: 17 U/L (ref 0–53)
Alkaline Phosphatase: 121 U/L — ABNORMAL HIGH (ref 39–117)
CO2: 26 mEq/L (ref 19–32)
Chloride: 103 mEq/L (ref 96–112)
GFR calc Af Amer: >90 mL/min (ref >90)
GFR calc non Af Amer: >90 mL/min (ref >90)
Glucose, Bld: 205 mg/dL — ABNORMAL HIGH (ref 70–99)
Potassium: 4.5 mEq/L (ref 3.5–5.1)
Sodium: 139 mEq/L (ref 135–145)
Total Bilirubin: 0.4 mg/dL (ref 0.3–1.2)
Total Protein: 6.3 g/dL (ref 6.0–8.3)

## 2011-07-16 LAB — CBC
Hemoglobin: 15.7 g/dL (ref 13.0–17.0)
Platelets: 207 10*3/uL (ref 150–400)
RBC: 5.84 MIL/uL — ABNORMAL HIGH (ref 4.22–5.81)
WBC: 6.3 10*3/uL (ref 4.0–10.5)

## 2011-07-16 LAB — DIFFERENTIAL
Lymphocytes Relative: 25 % (ref 12–46)
Lymphs Abs: 1.6 10*3/uL (ref 0.7–4.0)
Monocytes Relative: 9 % (ref 3–12)
Neutro Abs: 4.1 10*3/uL (ref 1.7–7.7)
Neutrophils Relative %: 64 % (ref 43–77)

## 2011-07-16 MED ORDER — ASPIRIN 81 MG PO CHEW
324.0000 mg | CHEWABLE_TABLET | Freq: Once | ORAL | Status: AC
  Administered 2011-07-16: 243 mg via ORAL
  Filled 2011-07-16: qty 3

## 2011-07-16 MED ORDER — ONDANSETRON HCL 4 MG/2ML IJ SOLN
4.0000 mg | Freq: Once | INTRAMUSCULAR | Status: AC
  Administered 2011-07-16: 4 mg via INTRAVENOUS
  Filled 2011-07-16: qty 2

## 2011-07-16 MED ORDER — NITROGLYCERIN 0.4 MG SL SUBL
0.4000 mg | SUBLINGUAL_TABLET | SUBLINGUAL | Status: DC | PRN
  Administered 2011-07-16 (×4): 0.4 mg via SUBLINGUAL
  Filled 2011-07-16: qty 25

## 2011-07-16 MED ORDER — MORPHINE SULFATE 4 MG/ML IJ SOLN
4.0000 mg | Freq: Once | INTRAMUSCULAR | Status: AC
  Administered 2011-07-16: 4 mg via INTRAVENOUS
  Filled 2011-07-16: qty 1

## 2011-07-16 NOTE — Telephone Encounter (Signed)
Patient has not been seen since 09/1999 and has been sent certified letters that have been received.  Per Dr Lovena Le.  He needs to be seen in the office to discuss his medications I have asked Melissa to call him to schedule at our next available time

## 2011-07-16 NOTE — Telephone Encounter (Signed)
lmom for pt to continue his Metoprolol and Lanoxin this will help keep his heart rate down.  He can call me back if this is a problem

## 2011-07-16 NOTE — ED Notes (Signed)
The pt took a 325,g aspirin approx 2 hours ago at home

## 2011-07-16 NOTE — ED Provider Notes (Signed)
History     CSN: [id number]  Arrival date & time 07/16/11  1948   First MD Initiated Contact with Patient 07/16/11 2303      Chief Complaint  Patient presents with  . Chest Pain    (Consider location/radiation/quality/duration/timing/severity/associated sxs/prior treatment) HPI  H/o CAD, CHF EF 25-30%, paroxysmal afib, AICD, IDDM, HTN, HLD  C/o sudden onset chest pain approx 3PM today "felt like a hatchet" c/o nausea, lightheadedness, shortness of breath. Took 4 NTG pta with minimal relief. Continues to feel SOB as well. Currently rates as 9.5/10. Worse with movement. Radiation to shoulder and left neck. Cont to have nausea, intermittent diaphoresis. Denies h/o VTE in self -- +history in mom and brothers. No recent hosp/surg/immob. No h/o cancer. Denies exogenous hormone use, no leg pain -- swelling b/l LE last two nights  Cardiology [place] Dr. Lovena Le.    ED Notes, ED Provider Notes from 07/16/11 0000 to 07/16/11 20:00:57       Altha Harm Chrisco, RN 07/16/2011 19:58      The pt has had lt upper chest pain for 4 hours with sob and nausea. He has taken 3 sl nitor with no relief. He has a defib that has not fired    Past Medical History  Diagnosis Date  . Dilated cardiomyopathy     status post ICD placement in 2008 c/b tearing at the atrial junction resulting in tamponade and urgent thoracotomy  . Gout, unspecified   . Dyslipidemia   . Hypertension   . Diabetes uncomplicated adult-type II     on insulin therapy, a1c 12.9 in 01/2011  . Chronic systolic heart failure     2D echo (123456) - Systolic function severely reduced., LV EF 20-25%. Akinesis of apical and anteroseptal mycoardium.  last 2D echo (11/2008 ), LV EF 25-30%, diffuse hypokinesis, and grade 1 diastolic dysfunction.  Marland Kitchen CAD (coronary artery disease)     Cardiac cath (08/19/2000) - Nonobstructive, 40% stenosis of LAD,.   . Paroxysmal atrial fibrillation     on chronic coumadin, goal INR 2-3. status post direct current    . Obesity (BMI 30.0-34.9)   . Hepatic steatosis 2011    seen on ultrasound.   . Anginal pain   . Myocardial infarction 2008  . ICD (implantable cardiac defibrillator) in place 2007  . Shortness of breath   . Pacemaker   . CHF (congestive heart failure)   . Cancer 2013    benign stomach cancer  . Peptic ulcer     Past Surgical History  Procedure Date  . Icd placement   . Cardiac pacemaker placement   . Left total knee replacement   . Cholecystectomy   . Esophagogastroduodenoscopy 04/05/2011    Procedure: ESOPHAGOGASTRODUODENOSCOPY (EGD);  Surgeon: Irene Shipper, MD;  [facility] ENDOSCOPY;  Service: Endoscopy;  Laterality: N/A;  . Joint replacement     left knee replacement  . Cardiac catheterization     Family History  Problem Relation Age of Onset  . Diabetes Mother   . Coronary artery disease Mother 20    Died of MI at 82  . Gout Mother   . Heart disease Mother   . Heart attack Mother   . Diabetes Father   . Coronary artery disease Father 77    Died in a house fire   . Heart disease Father   . Heart disease Brother     History  Substance Use Topics  . Smoking status: Never Smoker   . Smokeless tobacco:  Never Used  . Alcohol Use: No     Review of Systems  All other systems reviewed and are negative.   except as noted HPI    Allergies  Orange fruit and Penicillins  Home Medications   No current outpatient prescriptions on file.  BP 132/81  Pulse 89  Temp 98.7 F (37.1 C) (Oral)  Resp 17  Ht 6\' 3"  (1.905 m)  Wt 271 lb 2.7 oz (123 kg)  BMI 33.89 kg/m2  SpO2 96%  Physical Exam  Nursing note and vitals reviewed. Constitutional: He is oriented to person, place, and time. He appears well-developed and well-nourished. No distress.  HENT:  Head: Atraumatic.  Mouth/Throat: Oropharynx is clear and moist.  Eyes: Conjunctivae are normal. Pupils are equal, round, and reactive to light.  Neck: Neck supple.  Cardiovascular: Normal rate, regular  rhythm, normal heart sounds and intact distal pulses.  Exam reveals no gallop and no friction rub.   No murmur heard. Pulmonary/Chest: Effort normal. No respiratory distress. He has no wheezes. He has no rales. He exhibits tenderness.  Abdominal: Soft. Bowel sounds are normal. There is tenderness. There is no rebound and no guarding.       Min epigastric ttp  Musculoskeletal: Normal range of motion. He exhibits no edema and no tenderness.  Neurological: He is alert and oriented to person, place, and time.  Skin: Skin is warm and dry.  Psychiatric: He has a normal mood and affect.    Date: 07/17/2011  Rate: 108 Sinus tachycardia with Fusion complexes Possible Left atrial enlargement Anterior infarct , age undetermined T wave abnormality, consider inferior ischemia Abnormal ECG   ED Course  Procedures (including critical care time)  Labs Reviewed  CBC - Abnormal; Notable for the following:    RBC 5.84 (*)     All other components within normal limits  COMPREHENSIVE METABOLIC PANEL - Abnormal; Notable for the following:    Glucose, Bld 205 (*)     Albumin 3.3 (*)     Alkaline Phosphatase 121 (*)     All other components within normal limits  DIGOXIN LEVEL - Abnormal; Notable for the following:    Digoxin Level 0.6 (*)     All other components within normal limits  HEPATIC FUNCTION PANEL - Abnormal; Notable for the following:    Albumin 3.1 (*)     All other components within normal limits  D-DIMER, QUANTITATIVE - Abnormal; Notable for the following:    D-Dimer, Quant 1.78 (*)     All other components within normal limits  PRO B NATRIURETIC PEPTIDE - Abnormal; Notable for the following:    Pro B Natriuretic peptide (BNP) 2793.0 (*)     All other components within normal limits  PROTIME-INR - Abnormal; Notable for the following:    Prothrombin Time 15.4 (*)     All other components within normal limits  TSH - Abnormal; Notable for the following:    TSH 6.144 (*)     All  other components within normal limits  CARDIAC PANEL(CRET KIN+CKTOT+MB+TROPI) - Abnormal; Notable for the following:    CK, MB 5.2 (*)     Relative Index 4.5 (*)     All other components within normal limits  CARDIAC PANEL(CRET KIN+CKTOT+MB+TROPI) - Abnormal; Notable for the following:    CK, MB 4.7 (*)     All other components within normal limits  CARDIAC PANEL(CRET KIN+CKTOT+MB+TROPI) - Abnormal; Notable for the following:    CK, MB 4.3 (*)  All other components within normal limits  GLUCOSE, CAPILLARY - Abnormal; Notable for the following:    Glucose-Capillary 41 (*)     All other components within normal limits  GLUCOSE, CAPILLARY - Abnormal; Notable for the following:    Glucose-Capillary 229 (*)     All other components within normal limits  GLUCOSE, CAPILLARY - Abnormal; Notable for the following:    Glucose-Capillary 165 (*)     All other components within normal limits  GLUCOSE, CAPILLARY - Abnormal; Notable for the following:    Glucose-Capillary 126 (*)     All other components within normal limits  GLUCOSE, CAPILLARY - Abnormal; Notable for the following:    Glucose-Capillary 167 (*)     All other components within normal limits  GLUCOSE, CAPILLARY - Abnormal; Notable for the following:    Glucose-Capillary 199 (*)     All other components within normal limits  DIFFERENTIAL  TROPONIN I  LIPASE, BLOOD  MAGNESIUM  MRSA PCR SCREENING  APTT  BASIC METABOLIC PANEL   Dg Chest 2 View  07/16/2011  *RADIOLOGY REPORT*  Clinical Data: Sharp pain over left breast, with nausea, vomiting, dry heaving and shortness of breath.  CHEST - 2 VIEW  Comparison: Chest radiograph performed 02/18/2011  Findings: The lungs are well-aerated.  Bilateral central airspace opacity raises question for mild interstitial edema, with chronic underlying vascular congestion.  There is no evidence of pleural effusion or pneumothorax.  Cardiomegaly is mildly less prominent than on the prior study.   The patient is status post median sternotomy.  An AICD is noted at the left chest wall, with a single lead ending at the right ventricle. No acute osseous abnormalities are seen.  Clips are noted within the right upper quadrant, reflecting prior cholecystectomy.  IMPRESSION: Bilateral central airspace opacities raise question for mild interstitial edema, with underlying chronic vascular congestion. Cardiomegaly appears less prominent than on the prior study.  Original Report Authenticated By: Santa Lighter, M.D.   Ct Angio Chest W/cm &/or Wo Cm  07/17/2011  *RADIOLOGY REPORT*  Clinical Data: Chest pain and shortness of breath.  CT ANGIOGRAPHY CHEST  Technique:  Multidetector CT imaging of the chest using the standard protocol during bolus administration of intravenous contrast. Multiplanar reconstructed images including MIPs were obtained and reviewed to evaluate the vascular anatomy.  Contrast: 177mL OMNIPAQUE IOHEXOL 350 MG/ML SOLN  Comparison: Chest radiograph performed 07/16/2011, and CTA of the chest performed 03/09/2008  Findings: There is no evidence of central pulmonary embolus. Evaluation for pulmonary embolus is suboptimal due to motion artifact.  Trace bilateral pleural effusions are noted, right greater than left.  Diffuse interstitial prominence is noted, with minimal bilateral opacities, suggestive of mild interstitial edema.  There is no evidence of pneumothorax.  No masses are identified; no abnormal focal contrast enhancement is seen.  The patient is status post median sternotomy.  The heart is mildly enlarged.  No mediastinal lymphadenopathy is seen; scattered mediastinal nodes remain normal in size.  The great vessels are unremarkable in appearance.  No pericardial effusion is identified. A left-sided AICD is noted, with a single lead at the right ventricle.  No axillary lymphadenopathy is seen.  The visualized portions of the thyroid gland are unremarkable in appearance.  A small amount of  ascites is noted surrounding the liver and spleen.  The visualized portions of the liver and spleen are otherwise unremarkable.  No acute osseous abnormalities are seen.  There is minimal chronic- appearing flattening of the T3 vertebral  body.  IMPRESSION:  1.  No evidence of central pulmonary embolus. 2.  Trace bilateral pleural effusions, right greater than left, with diffuse interstitial prominence and minimal bilateral opacities, suggesting mild interstitial edema. 3.  Mild cardiomegaly noted. 4.  Small amount of ascites noted surrounding the liver and spleen.  Original Report Authenticated By: Santa Lighter, M.D.     1. Chest pain   2. Shortness of breath   3. CHF (congestive heart failure)   4. Acute on chronic combined systolic and diastolic heart failure, NYHA class 3     MDM  H/o CHF, CAD pw chest pain, shortness of breath likely 2/2 CHF exacerbation. CTA negative PE. Given morphine and ntg in ED with ongoing CP noted. Patient admitted to cardiology for further evaluation and w/u, diuresis. D/W Cardiology service who has evaluated in the Emergency Department.         Blair Heys, MD 07/18/11 [phone]

## 2011-07-16 NOTE — ED Notes (Signed)
The pt has had lt upper chest pain for 4 hours with sob and nausea.  He has taken 3 sl nitor with no relief.  He has a defib that has not fired

## 2011-07-16 NOTE — Telephone Encounter (Signed)
Per pt call his heart rate is going up and sown and he needs to talk to someone

## 2011-07-17 ENCOUNTER — Inpatient Hospital Stay (HOSPITAL_COMMUNITY): Payer: Medicaid Other

## 2011-07-17 ENCOUNTER — Encounter (HOSPITAL_COMMUNITY): Payer: Self-pay | Admitting: Radiology

## 2011-07-17 DIAGNOSIS — R079 Chest pain, unspecified: Secondary | ICD-10-CM

## 2011-07-17 DIAGNOSIS — I369 Nonrheumatic tricuspid valve disorder, unspecified: Secondary | ICD-10-CM

## 2011-07-17 DIAGNOSIS — I5043 Acute on chronic combined systolic (congestive) and diastolic (congestive) heart failure: Secondary | ICD-10-CM

## 2011-07-17 LAB — GLUCOSE, CAPILLARY
Glucose-Capillary: 165 mg/dL — ABNORMAL HIGH (ref 70–99)
Glucose-Capillary: 167 mg/dL — ABNORMAL HIGH (ref 70–99)
Glucose-Capillary: 199 mg/dL — ABNORMAL HIGH (ref 70–99)

## 2011-07-17 LAB — HEPATIC FUNCTION PANEL
Albumin: 3.1 g/dL — ABNORMAL LOW (ref 3.5–5.2)
Alkaline Phosphatase: 116 U/L (ref 39–117)
Bilirubin, Direct: 0.1 mg/dL (ref 0.0–0.3)
Total Bilirubin: 0.5 mg/dL (ref 0.3–1.2)

## 2011-07-17 LAB — CARDIAC PANEL(CRET KIN+CKTOT+MB+TROPI)
CK, MB: 5.2 ng/mL — ABNORMAL HIGH (ref 0.3–4.0)
CK, MB: 4.7 ng/mL — ABNORMAL HIGH (ref 0.3–4.0)
Total CK: 84 U/L (ref 7–232)
Total CK: 70 U/L (ref 7–232)
Troponin I: <0.3 ng/mL (ref <0.30)

## 2011-07-17 LAB — TSH: TSH: 6.144 u[IU]/mL — ABNORMAL HIGH (ref 0.350–4.500)

## 2011-07-17 LAB — PROTIME-INR: Prothrombin Time: 15.4 seconds — ABNORMAL HIGH (ref 11.6–15.2)

## 2011-07-17 LAB — D-DIMER, QUANTITATIVE: D-Dimer, Quant: 1.78 ug/mL-FEU — ABNORMAL HIGH (ref 0.00–0.48)

## 2011-07-17 MED ORDER — SODIUM CHLORIDE 0.9 % IV SOLN
250.0000 mL | INTRAVENOUS | Status: DC | PRN
  Administered 2011-07-17: 10 mL via INTRAVENOUS

## 2011-07-17 MED ORDER — FUROSEMIDE 10 MG/ML IJ SOLN
80.0000 mg | Freq: Once | INTRAMUSCULAR | Status: AC
  Administered 2011-07-17: 80 mg via INTRAVENOUS
  Filled 2011-07-17: qty 8

## 2011-07-17 MED ORDER — NITROGLYCERIN IN D5W 200-5 MCG/ML-% IV SOLN
30.0000 ug/min | INTRAVENOUS | Status: DC
  Administered 2011-07-17 – 2011-07-18 (×2): 30 ug/min via INTRAVENOUS
  Filled 2011-07-17 (×2): qty 250

## 2011-07-17 MED ORDER — HEPARIN SODIUM (PORCINE) 5000 UNIT/ML IJ SOLN
5000.0000 [IU] | Freq: Three times a day (TID) | INTRAMUSCULAR | Status: DC
  Administered 2011-07-17 – 2011-07-22 (×16): 5000 [IU] via SUBCUTANEOUS
  Filled 2011-07-17 (×18): qty 1

## 2011-07-17 MED ORDER — IOHEXOL 350 MG/ML SOLN
100.0000 mL | Freq: Once | INTRAVENOUS | Status: AC | PRN
  Administered 2011-07-17: 100 mL via INTRAVENOUS

## 2011-07-17 MED ORDER — DIGOXIN 250 MCG PO TABS
250.0000 ug | ORAL_TABLET | Freq: Every day | ORAL | Status: DC
  Administered 2011-07-17 – 2011-07-22 (×6): 250 ug via ORAL
  Filled 2011-07-17 (×6): qty 1

## 2011-07-17 MED ORDER — SODIUM CHLORIDE 0.9 % IJ SOLN: 3.0000 mL | INTRAMUSCULAR | Status: DC | PRN

## 2011-07-17 MED ORDER — LISINOPRIL 10 MG PO TABS
10.0000 mg | ORAL_TABLET | Freq: Every day | ORAL | Status: DC
  Administered 2011-07-17 – 2011-07-18 (×2): 10 mg via ORAL
  Filled 2011-07-17 (×3): qty 1

## 2011-07-17 MED ORDER — METOPROLOL SUCCINATE ER 100 MG PO TB24
100.0000 mg | ORAL_TABLET | Freq: Every day | ORAL | Status: DC
  Administered 2011-07-17 – 2011-07-18 (×2): 100 mg via ORAL
  Filled 2011-07-17 (×3): qty 1

## 2011-07-17 MED ORDER — PANTOPRAZOLE SODIUM 40 MG PO TBEC
40.0000 mg | DELAYED_RELEASE_TABLET | Freq: Two times a day (BID) | ORAL | Status: DC
  Administered 2011-07-17 – 2011-07-22 (×11): 40 mg via ORAL
  Filled 2011-07-17 (×11): qty 1

## 2011-07-17 MED ORDER — ASPIRIN EC 81 MG PO TBEC
81.0000 mg | DELAYED_RELEASE_TABLET | Freq: Every day | ORAL | Status: DC
  Administered 2011-07-17 – 2011-07-22 (×6): 81 mg via ORAL
  Filled 2011-07-17 (×6): qty 1

## 2011-07-17 MED ORDER — ACETAMINOPHEN 325 MG PO TABS
650.0000 mg | ORAL_TABLET | ORAL | Status: DC | PRN
  Administered 2011-07-17: 650 mg via ORAL
  Filled 2011-07-17 (×2): qty 1

## 2011-07-17 MED ORDER — INSULIN GLARGINE 100 UNIT/ML ~~LOC~~ SOLN
15.0000 [IU] | Freq: Every day | SUBCUTANEOUS | Status: DC
  Administered 2011-07-17 – 2011-07-21 (×5): 15 [IU] via SUBCUTANEOUS

## 2011-07-17 MED ORDER — FUROSEMIDE 10 MG/ML IJ SOLN
60.0000 mg | Freq: Two times a day (BID) | INTRAMUSCULAR | Status: DC
  Administered 2011-07-17 – 2011-07-18 (×3): 60 mg via INTRAVENOUS
  Filled 2011-07-17 (×5): qty 6

## 2011-07-17 MED ORDER — MORPHINE SULFATE 4 MG/ML IJ SOLN
INTRAMUSCULAR | Status: AC
  Administered 2011-07-17: 4 mg
  Filled 2011-07-17: qty 1

## 2011-07-17 MED ORDER — INSULIN ASPART 100 UNIT/ML ~~LOC~~ SOLN
5.0000 [IU] | Freq: Three times a day (TID) | SUBCUTANEOUS | Status: DC
  Administered 2011-07-17 – 2011-07-22 (×17): 5 [IU] via SUBCUTANEOUS

## 2011-07-17 MED ORDER — MORPHINE SULFATE 4 MG/ML IJ SOLN
4.0000 mg | Freq: Once | INTRAMUSCULAR | Status: AC
  Administered 2011-07-17: 4 mg via INTRAVENOUS

## 2011-07-17 MED ORDER — SODIUM CHLORIDE 0.9 % IJ SOLN
3.0000 mL | Freq: Two times a day (BID) | INTRAMUSCULAR | Status: DC
  Administered 2011-07-17 – 2011-07-18 (×2): 3 mL via INTRAVENOUS

## 2011-07-17 MED ORDER — SIMVASTATIN 40 MG PO TABS
40.0000 mg | ORAL_TABLET | Freq: Every evening | ORAL | Status: DC
  Administered 2011-07-17 – 2011-07-20 (×4): 40 mg via ORAL
  Filled 2011-07-17 (×6): qty 1

## 2011-07-17 MED ORDER — ONDANSETRON HCL 4 MG/2ML IJ SOLN
4.0000 mg | Freq: Four times a day (QID) | INTRAMUSCULAR | Status: DC | PRN
  Administered 2011-07-17: 4 mg via INTRAVENOUS
  Filled 2011-07-17: qty 2

## 2011-07-17 NOTE — Care Management Note (Signed)
    Page 1 of 1   07/17/2011     2:45:54 PM   CARE MANAGEMENT NOTE 07/17/2011  Patient:  Corey Palmer, Corey Palmer   Account Number:  [id number]  Date Initiated:  07/17/2011  Documentation initiated by:  Felix Ahmadi Assessment:   41 yr-old male adm with dx of HF/dilated cardiomyopathy; lives with spouse, independent PTA.     DC Planning Services  CM consult  Homebound not met per provider   [facility] arranged  [place] - 11 Patient Refused      Comments:  PCP:  Dr. Pryor Ochoa with [facility]  07/17/11 [place] RN MSN CCM Per pt, he is working with an attorney to apply for disability, was recently approved for MCD.  Discussed home health RN for CHF education/mgmt, pt states he is not homebound and feels no need for home health services.

## 2011-07-17 NOTE — Progress Notes (Signed)
Utilization Review Completed.  [place] T  07/17/2011

## 2011-07-17 NOTE — H&P (Signed)
Corey Palmer is an 41 y.o. male.   Chief Complaint: chest pain  HPI: 41 yo man with PMH of T2DM on insulin, last hba1c 12.9, HTN, HLD, dilated cardiomyopathy with ICD, atrial fibrillation not currently anticoagulated 2/2 recent UGIB 03/13 who comes in with 6# weight gain and chest pain. He tells me that he has had no change in diet or illness recently but has developed swollen legs and 4 pillow requirement to sleep. He has had no chest pain in over a year but developed significant chest pain today that only partially resolved with aspirin and sublingual NTG. He describes the pain as sharp/stabbing and similar to a pain he had about a year ago when he had collected fluid and required diuresis. He has actually lost weight until the last several days when he developed 6 lbs of weight gain. Otherwise, he has stopped his tikosyn for approximately 6 weeks 2/2 losing medicaid which he has recently regained. He has had no overt rectal bleeding but he did have some hematemesis evaluated a few weeks ago. No recent illness or sick contacts.   Past Medical History  Diagnosis Date  . Dilated cardiomyopathy     status post ICD placement in 2008 c/b tearing at the atrial junction resulting in tamponade and urgent thoracotomy  . Gout, unspecified   . Dyslipidemia   . Hypertension   . Diabetes uncomplicated adult-type II     on insulin therapy, a1c 12.9 in 01/2011  . Chronic systolic heart failure     2D echo (123456) - Systolic function severely reduced., LV EF 20-25%. Akinesis of apical and anteroseptal mycoardium.  last 2D echo (11/2008 ), LV EF 25-30%, diffuse hypokinesis, and grade 1 diastolic dysfunction.  Marland Kitchen CAD (coronary artery disease)     Cardiac cath (08/19/2000) - Nonobstructive, 40% stenosis of LAD,.   . Paroxysmal atrial fibrillation     on chronic coumadin, goal INR 2-3. status post direct current  . Obesity (BMI 30.0-34.9)   . Hepatic steatosis 2011    seen on ultrasound.     Past Surgical  History  Procedure Date  . Icd placement   . Cardiac pacemaker placement   . Left total knee replacement   . Cholecystectomy   . Esophagogastroduodenoscopy 04/05/2011    Procedure: ESOPHAGOGASTRODUODENOSCOPY (EGD);  Surgeon: Irene Shipper, MD;  Location: [facility] ENDOSCOPY;  Service: Endoscopy;  Laterality: N/A;    Family History  Problem Relation Age of Onset  . Diabetes Mother   . Coronary artery disease Mother 86    Died of MI at 13  . Diabetes Father   . Coronary artery disease Father 16    Died in a house fire   . Gout Mother    Social History:  reports that he has never smoked. He does not have any smokeless tobacco history on file. He reports that he does not drink alcohol or use illicit drugs.  Allergies:  Allergies  Allergen Reactions  . Orange Fruit Anaphylaxis  . Penicillins Anaphylaxis    Review of Systems  Constitutional: Negative for fever, chills and weight loss.       +weight gain   HENT: Negative for hearing loss, neck pain and tinnitus.   Eyes: Negative for blurred vision, double vision and photophobia.  Respiratory: Positive for shortness of breath. Negative for cough and hemoptysis.   Cardiovascular: Positive for chest pain, leg swelling and PND. Negative for palpitations, orthopnea and claudication.  Gastrointestinal: Negative for heartburn, nausea, vomiting and abdominal pain.  Genitourinary: Negative for dysuria, urgency and frequency.  Musculoskeletal: Negative for myalgias and back pain.  Skin: Negative for itching and rash.  Neurological: Negative for dizziness, tingling, tremors and headaches.  Endo/Heme/Allergies: Negative for environmental allergies. Does not bruise/bleed easily.  Psychiatric/Behavioral: Negative for depression, suicidal ideas and substance abuse.    Blood pressure 132/96, pulse 101, temperature 98.4 F (36.9 C), temperature source Oral, resp. rate 20, SpO2 95.00%. Physical Exam  Nursing note and vitals reviewed. Constitutional:  He is oriented to person, place, and time. He appears well-developed and well-nourished. No distress.  HENT:  Head: Normocephalic and atraumatic.  Nose: Nose normal.  Mouth/Throat: Oropharynx is clear and moist. No oropharyngeal exudate.  Eyes: Conjunctivae and EOM are normal. Pupils are equal, round, and reactive to light. No scleral icterus.  Neck: Normal range of motion. Neck supple. JVD present. No tracheal deviation present. No thyromegaly present.       JVD 12 cm  Cardiovascular: Normal rate, regular rhythm and intact distal pulses.  Exam reveals no gallop and no friction rub.   Murmur heard.      Soft systolic murmur @ LSB  Respiratory: Effort normal. No respiratory distress. He has no wheezes. He has rales.       Bibasilar rales  GI: Soft. Bowel sounds are normal. He exhibits no distension. There is no tenderness. There is no rebound.  Musculoskeletal: Normal range of motion. He exhibits edema. He exhibits no tenderness.  Neurological: He is alert and oriented to person, place, and time. He has normal reflexes. No cranial nerve deficit. Coordination normal.  Skin: Skin is warm and dry. No rash noted. He is not diaphoretic. No erythema.  Psychiatric: He has a normal mood and affect. His behavior is normal.    Labs reviewed; h/h 15.7/47.5, plt 207, na 139, K 4.5, bun/cr 19/1, albumin 3.3, lipase 55, bnp 2793, Troponin negative EKG reviewed; poor R wave progression/anterior infarct CXR: mild pulmonary edema, single RV lead ICD , cardiomegaly  Assessment/Plan 41 yo man with PMH as above here with chest pain, 4 pillow orthopnea, PND and swollen legs.  Problem List Acute on chronic systolic and diastolic heart failure Dilated Cardiomyopathy Recent UGIB 03/13 Pulmonary Edema Nonobstructive CAD - reported 40% LAD  Paroxsymal Atrial fibrillation T2DM on insulin Elevated proBNP  Plan: IV lasix in ER; 60 mg bid IV written for; 6+ lbs up, baseline weight 240#? - continue lisinopril 10  mg, toprol XL 100 mg, digoxin home dose  - asa 81 mg tomorrow - no current bleeding and risk/benefits assessed and discussed with patient --> continued anticoagulation with aspirin or reinitiation with warfarin will need to be discussed with patient and perhaps GI given his recent UGIB - trend troponins given history and may need consideration with nuclear vs. Stress Echo based on response to diuresis given prior known 40% LAD in '02 - defer heparin given negative enzymes and chest pain likely related to volume overload with somewhat recent bleeding - lantus 15 units and sliding scale insulin per home dosing - AM team to have device interrogated - repeat TTE in AM to assess for any WMA or progression of disease/valvular disorder - frequent BMP/electrolyte monitoring Cassandra Mcmanaman 07/17/2011, 2:44 AM

## 2011-07-17 NOTE — Progress Notes (Signed)
0430: Pt blood pressure is 171/122. Pt complaining of chest pain. MD notified. Pt started on nitro drip and morphine dose x1. States pain relief. Will continue to monitor.

## 2011-07-17 NOTE — ED Notes (Signed)
Patient transported to CT 

## 2011-07-17 NOTE — Progress Notes (Signed)
Patient: XIANG REUM Date of Encounter: 07/17/2011, 10:16 AM Admit date: 07/16/2011     Subjective  Briefly, Mr. Modzelewski is a 41 year old man with a NICM, EF 20% s/p single chamber ICD implantation in [id number], chronic systolic CHF, nonobstructive CAD s/p LHC in 2002, PAF, recent upper GI bleed (March 2013), HTN and DM who was admitted overnight with chest pain and acute on chronic systolic CHF. He has been noncompliant with medical therapy due to losing his Medicaid coverage; however, this has now been reinstated. He reports increasing dyspnea, LE edema, weight gain and 4-pillow orthopnea x 1 week. He developed chest pain yesterday which prompted him to come to the ED. His cardiac enzymes have been negative so far. D-dimer was elevated but CT angiogram of chest is negative for PE. His BNP is elevated at ~2800. He is now receiving IV furosemide and awaiting further work-up.  This morning Mr. Beach reports persistent SOB. His chest pain has resolved. He denies palpitations, dizziness, near syncope or syncope. He report LE swelling and orthopnea. He denies receiving ICD shocks.      Objective   Filed Vitals:   07/17/11 0924  BP: 135/98  Pulse: 100  Temp: 97.8  Resp: 24  O2 sat: 100% on [place] O2    Intake/Output Summary (Last 24 hours) at 07/17/11 1016 Last data filed at 07/17/11 0900  Gross per 24 hour  Intake    897 ml  Output   3100 ml  Net  -2203 ml    Physical Exam: General: Well developed 41 year old male in no acute distress. He is sitting comfortably. Head: Normocephalic, atraumatic, sclera non-icteric, no xanthomas, nares are without discharge.  Neck: Supple. + JVD. Lungs: Clear bilaterally to auscultation without wheezes, rales, or rhonchi. Breathing is unlabored. Heart: RRR. S1 S2 present. II/VI systolic murmur noted along LSB. No rubs or gallops.  Abdomen: Soft, non-tender, non-distended with normoactive bowel sounds. No hepatomegaly.  Extremities: No clubbing or cyanosis.  1+ pedal edema bilaterally.  Distal pedal pulses are 2+ and equal bilaterally. Neuro: Alert and oriented X 3. Moves all extremities spontaneously. No focal deficits. Psych:  Responds to questions appropriately with a normal affect.  Inpatient Medications:  . aspirin  324 mg Oral Once  . aspirin EC  81 mg Oral Daily  . digoxin  250 mcg Oral Daily  . furosemide  60 mg Intravenous Q12H  . furosemide  80 mg Intravenous Once  . heparin  5,000 Units Subcutaneous Q8H  . insulin aspart  5 Units Subcutaneous TID AC  . insulin glargine  15 Units Subcutaneous QHS  . lisinopril  10 mg Oral Daily  . metoprolol succinate  100 mg Oral Daily  . morphine injection  4 mg Intravenous Once  . ondansetron (Zofran)  4 mg Intravenous Once  . pantoprazole  40 mg Oral BID  . simvastatin  40 mg Oral QPM   . nitroGLYCERIN 30 mcg/min (07/17/11 0513)   Labs:  [facility] 07/17/11 0204 07/16/11 2133  NA -- 139  K -- 4.5  CL -- 103  CO2 -- 26  GLUCOSE -- 205*  BUN -- 19  CREATININE -- 1.01  CALCIUM -- 8.8  MG 1.8 --  PHOS -- --    Basename 07/16/11 2319 07/16/11 2133  AST 22 21  ALT 17 17  ALKPHOS 116 121*  BILITOT 0.5 0.4  PROT 6.3 6.3  ALBUMIN 3.1* 3.3*    Basename 07/16/11 2319  LIPASE 55  AMYLASE --    Basename 07/16/11 2133  WBC 6.3  NEUTROABS 4.1  HGB 15.7  HCT 47.5  MCV 81.3  PLT 207    Basename 07/17/11 0204 07/16/11 2136  CKTOTAL 116 --  CKMB 5.2* --  TROPONINI <0.30 <0.30   BNP    Component Value Date/Time   PROBNP 2793.0* 07/16/2011 2320    Basename 07/16/11 2319  DDIMER 1.78*   INR 1.19   Radiology/Studies: 07/16/2011  *RADIOLOGY REPORT*  Clinical Data: Sharp pain over left breast, with nausea, vomiting, dry heaving and shortness of breath.  CHEST - 2 VIEW  Comparison: Chest radiograph performed 02/18/2011  Findings: The lungs are well-aerated.  Bilateral central airspace opacity raises question for mild interstitial edema, with chronic underlying vascular  congestion.  There is no evidence of pleural effusion or pneumothorax.  Cardiomegaly is mildly less prominent than on the prior study.  The patient is status post median sternotomy.  An AICD is noted at the left chest wall, with a single lead ending at the right ventricle. No acute osseous abnormalities are seen.  Clips are noted within the right upper quadrant, reflecting prior cholecystectomy.  IMPRESSION: Bilateral central airspace opacities raise question for mild interstitial edema, with underlying chronic vascular congestion. Cardiomegaly appears less prominent than on the prior study.  Original Report Authenticated By: Santa Lighter, M.D.   07/17/2011  *RADIOLOGY REPORT*  Clinical Data: Chest pain and shortness of breath.  CT ANGIOGRAPHY CHEST  Technique:  Multidetector CT imaging of the chest using the standard protocol during bolus administration of intravenous contrast. Multiplanar reconstructed images including MIPs were obtained and reviewed to evaluate the vascular anatomy.  Contrast: 142mL OMNIPAQUE IOHEXOL 350 MG/ML SOLN  Comparison: Chest radiograph performed 07/16/2011, and CTA of the chest performed 03/09/2008  Findings: There is no evidence of central pulmonary embolus. Evaluation for pulmonary embolus is suboptimal due to motion artifact.  Trace bilateral pleural effusions are noted, right greater than left.  Diffuse interstitial prominence is noted, with minimal bilateral opacities, suggestive of mild interstitial edema.  There is no evidence of pneumothorax.  No masses are identified; no abnormal focal contrast enhancement is seen.  The patient is status post median sternotomy.  The heart is mildly enlarged.  No mediastinal lymphadenopathy is seen; scattered mediastinal nodes remain normal in size.  The great vessels are unremarkable in appearance.  No pericardial effusion is identified. A left-sided AICD is noted, with a single lead at the right ventricle.  No axillary lymphadenopathy is  seen.  The visualized portions of the thyroid gland are unremarkable in appearance.  A small amount of ascites is noted surrounding the liver and spleen.  The visualized portions of the liver and spleen are otherwise unremarkable.  No acute osseous abnormalities are seen.  There is minimal chronic- appearing flattening of the T3 vertebral body.  IMPRESSION:  1.  No evidence of central pulmonary embolus. 2.  Trace bilateral pleural effusions, right greater than left, with diffuse interstitial prominence and minimal bilateral opacities, suggesting mild interstitial edema. 3.  Mild cardiomegaly noted. 4.  Small amount of ascites noted surrounding the liver and spleen.  Original Report Authenticated By: Santa Lighter, M.D.   Echocardiogram: pending Telemetry: sinus tachycardia; no arrhythmias 12-lead ECG this AM: normal sinus rhythm, LVH with strain pattern and prolonged QT (QTc 490 ms)  Device interrogation performed this AM - Medtronic Secura VR (implanted Aug 2009) single chamber ICD Battery Voltage: 3.12V, Charge Time: 8.7 sec RV lead measurements:  Amplitude 15.8 mV  Impedence 494 ohms  Threshold 0.75V @ 0.4 ms  HV impedence 63 ohms  Episodes: High Ventricular rates: 2 (1 VF episode, 1 NSVT monitored)  Max V rate: VF episode April 26, 2011, >240 bpm  Therapy delivered: 25J shock x 1 which successfully terminated VF OptiVol fluid index up, >200 Programmed parameters: Loletha Grayer parameters:  Mode: VVI Lower Rate: 40  Tachy parameters:  VT1 zone: Rate: OFF    VT2 zone: Rate: OFF  VF zone:   Rate: >240 bpm, Therapies: ATP during charging, 25J, 35J x 5, Discriminators: wavelet Changes this session: None   Assessment and Plan  1. Acute on chronic systolic CHF - continue IV diuresis for next 24-48 hours; strict I/Os 2. Chest pain - resolved after IV NTG and better BP control; cardiac enzymes negative so far; will continue r/o MI by completing cycle of enzymes; chest discomfort likely related to  poorly controlled HTN and volume overload; will continue to monitor; last LHC in 2002 so may need further evaluation with nuclear stress test  3. ICD shock March 2013 - appropriate; device function normal; no programming changes made 4. Nonischemic CM - EF 20% by echo August 2012; repeat echo this AM pending; patient noncompliant with medicines; will resume medical therapy with metoprolol succinate and lisinopril 5. Paroxysmal atrial fibrillation - normal sinus rhythm currently; recently stopped Tikosyn due to lack of insurance coverage; continue rate control with BB for now; CHADS2VASc score is 4 indicating he is at high risk for cardioembolic event, approximately 4% per year; however, given his recent GI bleed will not resume warfarin until discussed with GI; also may be high risk if patient continues to be noncompliant 6. Recent upper GI bleed March 2013 - may need GI input prior to initiating/resuming anticoagulation 7. Hypertension - improved after resuming medical therapy; will follow and up-titrate CHF/BP meds as needed  Dr. Lovena Le to see and make further recommendations Signed, EDMISTEN, [facility]  Cardiology Attending  Patient seen and examined. He is well known to me, admitted with acute on chronic CHF. He is diuresing. Will continue IV lasix until we see renal function decline. Will need early followup as an outpatient. He has previously been very non-compliant, missing multiple clinic visits.  Cristopher Peru, M.D.

## 2011-07-17 NOTE — Progress Notes (Signed)
0403 Pt CBG upon arrival to room is 41. Hypoglycemic protocol followed. Pt complained of headache and nausea. Medicated prn zofran and pt given a snack. Rechecked CBG was 229.

## 2011-07-17 NOTE — Progress Notes (Signed)
  Echocardiogram 2D Echocardiogram has been performed.  Corey Palmer Corey Palmer 07/17/2011, 11:50 AM

## 2011-07-18 LAB — BASIC METABOLIC PANEL
CO2: 27 mEq/L (ref 19–32)
Calcium: 9 mg/dL (ref 8.4–10.5)
Chloride: 99 mEq/L (ref 96–112)
Glucose, Bld: 157 mg/dL — ABNORMAL HIGH (ref 70–99)
Potassium: 4 mEq/L (ref 3.5–5.1)
Sodium: 137 mEq/L (ref 135–145)

## 2011-07-18 LAB — GLUCOSE, CAPILLARY

## 2011-07-18 MED ORDER — FUROSEMIDE 40 MG PO TABS
40.0000 mg | ORAL_TABLET | Freq: Two times a day (BID) | ORAL | Status: DC
  Administered 2011-07-18 – 2011-07-19 (×2): 40 mg via ORAL
  Filled 2011-07-18 (×4): qty 1

## 2011-07-18 NOTE — Progress Notes (Signed)
Patient ID: DEVENDRA FEUERHELM, male   DOB: 08-Feb-1970, 41 y.o.   MRN: [id number]     Patient: Corey Palmer Date of Encounter: 07/18/2011, 11:55 AM Admit date: 07/16/2011     Subjective  Briefly, Mr. Sebring is a 41 year old man with a NICM, EF 20% s/p single chamber ICD implantation in [id number], chronic systolic CHF, nonobstructive CAD s/p LHC in 2002, PAF, recent upper GI bleed (March 2013), HTN and DM who was admitted overnight with chest pain and acute on chronic systolic CHF. He has been noncompliant with medical therapy due to losing his Medicaid coverage; however, this has now been reinstated. He reports increasing dyspnea, LE edema, weight gain and 4-pillow orthopnea x 1 week. He developed chest pain yesterday which prompted him to come to the ED. His cardiac enzymes have been negative so far. D-dimer was elevated but CT angiogram of chest is negative for PE. His BNP is elevated at ~2800. He is now receiving IV furosemide and awaiting further work-up.  This morning Mr. Mcglocklin reports that his breathing is almost back to normal. His chest pain has resolved. He denies palpitations, dizziness, near syncope or syncope. He denies receiving ICD shocks.      Objective   Filed Vitals:   07/17/11 0924  BP: 135/98  Pulse: 100  Temp: 97.8  Resp: 24  O2 sat: 100% on [place] O2    Intake/Output Summary (Last 24 hours) at 07/18/11 1155 Last data filed at 07/18/11 0900  Gross per 24 hour  Intake    990 ml  Output   3800 ml  Net  -2810 ml    Physical Exam: General: Well developed 41 year old male in no acute distress. He is sitting comfortably. Head: Normocephalic, atraumatic, sclera non-icteric, no xanthomas, nares are without discharge.  Neck: Supple. No JVD. Lungs: Clear bilaterally to auscultation without wheezes, rales, or rhonchi. Breathing is unlabored. Heart: RRR. S1 S2 present. II/VI systolic murmur noted along LSB. No rubs or gallops.  Abdomen: Soft, non-tender, non-distended with normoactive  bowel sounds. No hepatomegaly.  Extremities: No clubbing or cyanosis. No edema.  Distal pedal pulses are 2+ and equal bilaterally. Neuro: Alert and oriented X 3. Moves all extremities spontaneously. No focal deficits. Psych:  Responds to questions appropriately with a normal affect.  Inpatient Medications:  . aspirin  324 mg Oral Once  . aspirin EC  81 mg Oral Daily  . digoxin  250 mcg Oral Daily  . furosemide  60 mg Intravenous Q12H  . furosemide  80 mg Intravenous Once  . heparin  5,000 Units Subcutaneous Q8H  . insulin aspart  5 Units Subcutaneous TID AC  . insulin glargine  15 Units Subcutaneous QHS  . lisinopril  10 mg Oral Daily  . metoprolol succinate  100 mg Oral Daily  . morphine injection  4 mg Intravenous Once  . ondansetron (Zofran)  4 mg Intravenous Once  . pantoprazole  40 mg Oral BID  . simvastatin  40 mg Oral QPM   . nitroGLYCERIN 30 mcg/min (07/17/11 0513)   Labs:  Basename 07/18/11 0405 07/17/11 0204 07/16/11 2133  NA 137 -- 139  K 4.0 -- 4.5  CL 99 -- 103  CO2 27 -- 26  GLUCOSE 157* -- 205*  BUN 22 -- 19  CREATININE 1.32 -- 1.01  CALCIUM 9.0 -- 8.8  MG -- 1.8 --  PHOS -- -- --    Basename 07/16/11 2319 07/16/11 2133  AST 22 21  ALT  17 17  ALKPHOS 116 121*  BILITOT 0.5 0.4  PROT 6.3 6.3  ALBUMIN 3.1* 3.3*    Basename 07/16/11 2319  LIPASE 55  AMYLASE --    Basename 07/16/11 2133  WBC 6.3  NEUTROABS 4.1  HGB 15.7  HCT 47.5  MCV 81.3  PLT 207    Basename 07/17/11 2004 07/17/11 1208 07/17/11 0204 07/16/11 2136  CKTOTAL 70 84 116 --  CKMB 4.3* 4.7* 5.2* --  TROPONINI <0.30 <0.30 <0.30 <0.30   BNP    Component Value Date/Time   PROBNP 2793.0* 07/16/2011 2320    Basename 07/16/11 2319  DDIMER 1.78*   INR 1.19   Radiology/Studies: 07/16/2011  *RADIOLOGY REPORT*  Clinical Data: Sharp pain over left breast, with nausea, vomiting, dry heaving and shortness of breath.  CHEST - 2 VIEW  Comparison: Chest radiograph performed 02/18/2011   Findings: The lungs are well-aerated.  Bilateral central airspace opacity raises question for mild interstitial edema, with chronic underlying vascular congestion.  There is no evidence of pleural effusion or pneumothorax.  Cardiomegaly is mildly less prominent than on the prior study.  The patient is status post median sternotomy.  An AICD is noted at the left chest wall, with a single lead ending at the right ventricle. No acute osseous abnormalities are seen.  Clips are noted within the right upper quadrant, reflecting prior cholecystectomy.  IMPRESSION: Bilateral central airspace opacities raise question for mild interstitial edema, with underlying chronic vascular congestion. Cardiomegaly appears less prominent than on the prior study.  Original Report Authenticated By: Santa Lighter, M.D.   07/17/2011  *RADIOLOGY REPORT*  Clinical Data: Chest pain and shortness of breath.  CT ANGIOGRAPHY CHEST  Technique:  Multidetector CT imaging of the chest using the standard protocol during bolus administration of intravenous contrast. Multiplanar reconstructed images including MIPs were obtained and reviewed to evaluate the vascular anatomy.  Contrast: 17mL OMNIPAQUE IOHEXOL 350 MG/ML SOLN  Comparison: Chest radiograph performed 07/16/2011, and CTA of the chest performed 03/09/2008  Findings: There is no evidence of central pulmonary embolus. Evaluation for pulmonary embolus is suboptimal due to motion artifact.  Trace bilateral pleural effusions are noted, right greater than left.  Diffuse interstitial prominence is noted, with minimal bilateral opacities, suggestive of mild interstitial edema.  There is no evidence of pneumothorax.  No masses are identified; no abnormal focal contrast enhancement is seen.  The patient is status post median sternotomy.  The heart is mildly enlarged.  No mediastinal lymphadenopathy is seen; scattered mediastinal nodes remain normal in size.  The great vessels are unremarkable in  appearance.  No pericardial effusion is identified. A left-sided AICD is noted, with a single lead at the right ventricle.  No axillary lymphadenopathy is seen.  The visualized portions of the thyroid gland are unremarkable in appearance.  A small amount of ascites is noted surrounding the liver and spleen.  The visualized portions of the liver and spleen are otherwise unremarkable.  No acute osseous abnormalities are seen.  There is minimal chronic- appearing flattening of the T3 vertebral body.  IMPRESSION:  1.  No evidence of central pulmonary embolus. 2.  Trace bilateral pleural effusions, right greater than left, with diffuse interstitial prominence and minimal bilateral opacities, suggesting mild interstitial edema. 3.  Mild cardiomegaly noted. 4.  Small amount of ascites noted surrounding the liver and spleen.  Original Report Authenticated By: Santa Lighter, M.D.   Echocardiogram: pending Telemetry: NSR with NSVT 12-lead ECG this AM: normal sinus rhythm, LVH with  strain pattern and prolonged QT (QTc 490 ms)  Device interrogation  Medtronic Secura VR (implanted Aug 2009) single chamber ICD Battery Voltage: 3.12V, Charge Time: 8.7 sec RV lead measurements:     Amplitude 15.8 mV  Impedence 494 ohms  Threshold 0.75V @ 0.4 ms  HV impedence 63 ohms  Episodes: High Ventricular rates: 2 (1 VF episode, 1 NSVT monitored)  Max V rate: VF episode April 26, 2011, >240 bpm  Therapy delivered: 25J shock x 1 which successfully terminated VF OptiVol fluid index up, >200 Programmed parameters: Loletha Grayer parameters:  Mode: VVI Lower Rate: 40  Tachy parameters:  VT1 zone: Rate: OFF    VT2 zone: Rate: OFF  VF zone:   Rate: >240 bpm, Therapies: ATP during charging, 25J, 35J x 5, Discriminators: wavelet Changes this session: None   Assessment and Plan  1. Acute on chronic systolic CHF - continue IV diuresis transitioning to po lasix; strict I/Os 2. Chest pain - resolved. Will dc IV NTG. 4. Nonischemic CM  - EF 20% by echo August 2012; repeat echo this AM pending; patient noncompliant with medicines; will resume medical therapy with metoprolol succinate and lisinopril 5. Paroxysmal atrial fibrillation - normal sinus rhythm currently; recently stopped Tikosyn due to lack of insurance coverage. I do not think he is a good candidate at least for now as he may lose insurance coverage in the future. 6. Recent upper GI bleed March 2013 - may need GI input prior to initiating/resuming anticoagulation. My bias is to hold on coumadin for now. 7. Hypertension - improved after resuming medical therapy; will follow and up-titrate CHF/BP meds as needed  Cristopher Peru, M.D.

## 2011-07-18 NOTE — Progress Notes (Signed)
Patient had 8 beat 3 beat and 4 beat run of v tach. Patient is asymptomatic and currently sleeping HR 88- O2  99%, BP 151/97. Corbin Ade PA made aware. No new orders at this time.

## 2011-07-19 LAB — BASIC METABOLIC PANEL
BUN: 22 mg/dL (ref 6–23)
CO2: 28 mEq/L (ref 19–32)
Calcium: 9.1 mg/dL (ref 8.4–10.5)
Glucose, Bld: 131 mg/dL — ABNORMAL HIGH (ref 70–99)
Sodium: 138 mEq/L (ref 135–145)

## 2011-07-19 LAB — GLUCOSE, CAPILLARY
Glucose-Capillary: 127 mg/dL — ABNORMAL HIGH (ref 70–99)
Glucose-Capillary: 150 mg/dL — ABNORMAL HIGH (ref 70–99)

## 2011-07-19 MED ORDER — LISINOPRIL 20 MG PO TABS
20.0000 mg | ORAL_TABLET | Freq: Every day | ORAL | Status: DC
  Administered 2011-07-19 – 2011-07-20 (×2): 20 mg via ORAL
  Filled 2011-07-19 (×2): qty 1

## 2011-07-19 MED ORDER — FUROSEMIDE 10 MG/ML IJ SOLN
40.0000 mg | Freq: Three times a day (TID) | INTRAMUSCULAR | Status: DC
  Administered 2011-07-19 – 2011-07-21 (×7): 40 mg via INTRAVENOUS
  Filled 2011-07-19 (×10): qty 4

## 2011-07-19 MED ORDER — METOPROLOL SUCCINATE ER 50 MG PO TB24
150.0000 mg | ORAL_TABLET | Freq: Every day | ORAL | Status: DC
  Administered 2011-07-19 – 2011-07-22 (×4): 150 mg via ORAL
  Filled 2011-07-19 (×4): qty 1

## 2011-07-19 MED ORDER — POTASSIUM CHLORIDE CRYS ER 20 MEQ PO TBCR
40.0000 meq | EXTENDED_RELEASE_TABLET | Freq: Once | ORAL | Status: AC
  Administered 2011-07-19: 40 meq via ORAL
  Filled 2011-07-19: qty 2

## 2011-07-19 NOTE — Progress Notes (Signed)
Patient ID: Corey Palmer, male   DOB: 1970-10-11, 41 y.o.   MRN: [id number]    SUBJECTIVE: Breathing better.  Diuresed reasonably well yesterday on IV Lasix.  He was switched to po today.  BP still running quite high.    Current Facility-Administered Medications  Medication Dose Route Frequency Provider Last Rate Last Dose  . acetaminophen (TYLENOL) tablet 650 mg  650 mg Oral Q4H PRN Jules Husbands, MD   650 mg at 07/17/11 1606  . aspirin EC tablet 81 mg  81 mg Oral Daily Jules Husbands, MD   81 mg at 07/18/11 1054  . digoxin (LANOXIN) tablet 250 mcg  250 mcg Oral Daily Jules Husbands, MD   250 mcg at 07/18/11 1054  . furosemide (LASIX) injection 40 mg  40 mg Intravenous Q8H Larey Dresser, MD      . heparin injection 5,000 Units  5,000 Units Subcutaneous Q8H Jules Husbands, MD   5,000 Units at 07/19/11 [phone]  . insulin aspart (novoLOG) injection 5 Units  5 Units Subcutaneous TID [facility] Jules Husbands, MD   5 Units at 07/18/11 1732  . insulin glargine (LANTUS) injection 15 Units  15 Units Subcutaneous QHS Jules Husbands, MD   15 Units at 07/18/11 2143  . lisinopril (PRINIVIL,ZESTRIL) tablet 20 mg  20 mg Oral Daily Larey Dresser, MD      . metoprolol succinate (TOPROL-XL) 24 hr tablet 150 mg  150 mg Oral Daily Larey Dresser, MD      . nitroGLYCERIN (NITROSTAT) SL tablet 0.4 mg  0.4 mg Sublingual Q5 Min x 3 PRN Blair Heys, MD   0.4 mg at 07/16/11 2338  . ondansetron (ZOFRAN) injection 4 mg  4 mg Intravenous Q6H PRN Jules Husbands, MD   4 mg at 07/17/11 0514  . pantoprazole (PROTONIX) EC tablet 40 mg  40 mg Oral BID Jules Husbands, MD   40 mg at 07/18/11 2147  . potassium chloride (KLOR-CON) packet 40 mEq  40 mEq Oral Once Larey Dresser, MD      . simvastatin (ZOCOR) tablet 40 mg  40 mg Oral QPM Jules Husbands, MD   40 mg at 07/18/11 1732  . DISCONTD: 0.9 %  sodium chloride infusion  250 mL Intravenous PRN Jules Husbands, MD 10 mL/hr at 07/17/11 0506 10 mL at 07/17/11 0506  . DISCONTD: furosemide (LASIX) injection 60 mg   60 mg Intravenous Q12H Jules Husbands, MD   60 mg at 07/18/11 [id number]  . DISCONTD: furosemide (LASIX) tablet 40 mg  40 mg Oral BID Evans Lance, MD   40 mg at 07/19/11 0752  . DISCONTD: lisinopril (PRINIVIL,ZESTRIL) tablet 10 mg  10 mg Oral Daily Jules Husbands, MD   10 mg at 07/18/11 1054  . DISCONTD: metoprolol succinate (TOPROL-XL) 24 hr tablet 100 mg  100 mg Oral Daily Jules Husbands, MD   100 mg at 07/18/11 1053  . DISCONTD: nitroGLYCERIN 0.2 mg/mL in dextrose 5 % infusion  30 mcg/min Intravenous Continuous Jules Husbands, MD 9 mL/hr at 07/18/11 0701 30 mcg/min at 07/18/11 0701  . DISCONTD: sodium chloride 0.9 % injection 3 mL  3 mL Intravenous Q12H Jules Husbands, MD   3 mL at 07/18/11 1054  . DISCONTD: sodium chloride 0.9 % injection 3 mL  3 mL Intravenous PRN Jules Husbands, MD          Filed Vitals:   07/19/11 [id number] 07/19/11 0735 07/19/11 0736 07/19/11 0745  BP: 139/107 150/108 155/117 143/107  Pulse:  98   Temp:   97.3 F (36.3 C)   TempSrc:   Oral   Resp:   22   Height:      Weight:      SpO2:   97%     Intake/Output Summary (Last 24 hours) at 07/19/11 0804 Last data filed at 07/19/11 0600  Gross per 24 hour  Intake    954 ml  Output    875 ml  Net     79 ml    LABS: Basic Metabolic Panel:  Basename 07/19/11 0340 07/18/11 0405 07/17/11 0204  NA 138 137 --  K 4.0 4.0 --  CL 101 99 --  CO2 28 27 --  GLUCOSE 131* 157* --  BUN 22 22 --  CREATININE 1.30 1.32 --  CALCIUM 9.1 9.0 --  MG -- -- 1.8  PHOS -- -- --   Liver Function Tests:  [facility] 07/16/11 2319 07/16/11 2133  AST 22 21  ALT 17 17  ALKPHOS 116 121*  BILITOT 0.5 0.4  PROT 6.3 6.3  ALBUMIN 3.1* 3.3*    Basename 07/16/11 2319  LIPASE 55  AMYLASE --   CBC:  Basename 07/16/11 2133  WBC 6.3  NEUTROABS 4.1  HGB 15.7  HCT 47.5  MCV 81.3  PLT 207   Cardiac Enzymes:  Basename 07/17/11 2004 07/17/11 1208 07/17/11 0204  CKTOTAL 70 84 116  CKMB 4.3* 4.7* 5.2*  CKMBINDEX -- -- --  TROPONINI <0.30 <0.30  <0.30   BNP: No components found with this basename: POCBNP:3 D-Dimer:  Flo Shanks 07/16/11 2319  DDIMER 1.78*   Hemoglobin A1C: No results found for this basename: HGBA1C in the last 72 hours Fasting Lipid Panel: No results found for this basename: CHOL,HDL,LDLCALC,TRIG,CHOLHDL,LDLDIRECT in the last 72 hours Thyroid Function Tests:  Basename 07/17/11 0204  TSH 6.144*  T4TOTAL --  T3FREE --  THYROIDAB --   Anemia Panel: No results found for this basename: VITAMINB12,FOLATE,FERRITIN,TIBC,IRON,RETICCTPCT in the last 72 hours  RADIOLOGY:  Ct Angio Chest W/cm &/or Wo Cm  07/17/2011  *RADIOLOGY REPORT*  Clinical Data: Chest pain and shortness of breath.  CT ANGIOGRAPHY CHEST  Technique:  Multidetector CT imaging of the chest using the standard protocol during bolus administration of intravenous contrast. Multiplanar reconstructed images including MIPs were obtained and reviewed to evaluate the vascular anatomy.  Contrast: 191mL OMNIPAQUE IOHEXOL 350 MG/ML SOLN  Comparison: Chest radiograph performed 07/16/2011, and CTA of the chest performed 03/09/2008  Findings: There is no evidence of central pulmonary embolus. Evaluation for pulmonary embolus is suboptimal due to motion artifact.  Trace bilateral pleural effusions are noted, right greater than left.  Diffuse interstitial prominence is noted, with minimal bilateral opacities, suggestive of mild interstitial edema.  There is no evidence of pneumothorax.  No masses are identified; no abnormal focal contrast enhancement is seen.  The patient is status post median sternotomy.  The heart is mildly enlarged.  No mediastinal lymphadenopathy is seen; scattered mediastinal nodes remain normal in size.  The great vessels are unremarkable in appearance.  No pericardial effusion is identified. A left-sided AICD is noted, with a single lead at the right ventricle.  No axillary lymphadenopathy is seen.  The visualized portions of the thyroid gland are  unremarkable in appearance.  A small amount of ascites is noted surrounding the liver and spleen.  The visualized portions of the liver and spleen are otherwise unremarkable.  No acute osseous abnormalities are seen.  There is minimal chronic- appearing flattening of the  T3 vertebral body.  IMPRESSION:  1.  No evidence of central pulmonary embolus. 2.  Trace bilateral pleural effusions, right greater than left, with diffuse interstitial prominence and minimal bilateral opacities, suggesting mild interstitial edema. 3.  Mild cardiomegaly noted. 4.  Small amount of ascites noted surrounding the liver and spleen.  Original Report Authenticated By: Santa Lighter, M.D.   PHYSICAL EXAM General: NAD Neck: JVP 14 cm, no thyromegaly or thyroid nodule.  Lungs: Clear to auscultation bilaterally with normal respiratory effort. CV: Nondisplaced PMI.  Heart regular S1/S2, no S3/S4, 3/6 HSM LLSB/apex.  1+ edema 3/4 to knees bilaterally.  No carotid bruit.   Abdomen: Soft, nontender, no hepatosplenomegaly, no distention.  Neurologic: Alert and oriented x 3.  Psych: Normal affect. Extremities: No clubbing or cyanosis.   TELEMETRY: Reviewed telemetry pt in NSR  ASSESSMENT AND PLAN: 41 yo with nonischemic cardiomyopathy, recent GI bleed, and paroxysmal atrial fibrillation presented with acute on chronic systolic CHF.  1. CHF: Acute on chronic systolic.  He remains volume overloaded today.  EF 20% with moderate MR and severe TR on echo.  I think that he needs more diuresis prior to discharge. - Change Lasix to 40 mg IV q8hrs today.  - Increase lisinopril to 20 mg daily. - Increase Toprol XL to 150 mg daily.  - Continue digoxin => level was ok on 6/18.  2. Paroxysmal atrial fibrillation: He is in NSR.  Staying off Tikosyn due to issues with compliance.  He is off coumadin for now with recent GI bleed.  Will need to have him followup with GI as outpatient to consider restarting.   Loralie Champagne 07/19/2011 8:09  AM

## 2011-07-20 ENCOUNTER — Inpatient Hospital Stay (HOSPITAL_COMMUNITY): Payer: Medicaid Other

## 2011-07-20 DIAGNOSIS — I5023 Acute on chronic systolic (congestive) heart failure: Principal | ICD-10-CM

## 2011-07-20 DIAGNOSIS — R0602 Shortness of breath: Secondary | ICD-10-CM

## 2011-07-20 DIAGNOSIS — M79609 Pain in unspecified limb: Secondary | ICD-10-CM

## 2011-07-20 LAB — GLUCOSE, CAPILLARY: Glucose-Capillary: 126 mg/dL — ABNORMAL HIGH (ref 70–99)

## 2011-07-20 LAB — BASIC METABOLIC PANEL
BUN: 24 mg/dL — ABNORMAL HIGH (ref 6–23)
CO2: 32 mEq/L (ref 19–32)
Calcium: 9.4 mg/dL (ref 8.4–10.5)
Creatinine, Ser: 1.31 mg/dL (ref 0.50–1.35)
Glucose, Bld: 143 mg/dL — ABNORMAL HIGH (ref 70–99)
Sodium: 142 mEq/L (ref 135–145)

## 2011-07-20 MED ORDER — LISINOPRIL 40 MG PO TABS
40.0000 mg | ORAL_TABLET | Freq: Every day | ORAL | Status: DC
  Administered 2011-07-21 – 2011-07-22 (×2): 40 mg via ORAL
  Filled 2011-07-20 (×2): qty 1

## 2011-07-20 NOTE — Progress Notes (Signed)
Right lower extremity venous duplex completed.  Preliminary report is negative for DVT, SVT, or a Baker's cyst in the right leg.  Negative for DVT in the left common femoral vein. 

## 2011-07-20 NOTE — Progress Notes (Signed)
Patient ID: Corey Palmer, male   DOB: 1970/04/26, 41 y.o.   MRN: [id number]    SUBJECTIVE: No chest pain or dyspnea; complains of pain in right calf and blurred vision right eye for 2 days  Current Facility-Administered Medications  Medication Dose Route Frequency Provider Last Rate Last Dose  . acetaminophen (TYLENOL) tablet 650 mg  650 mg Oral Q4H PRN Jules Husbands, MD   650 mg at 07/17/11 1606  . aspirin EC tablet 81 mg  81 mg Oral Daily Jules Husbands, MD   81 mg at 07/19/11 0918  . digoxin (LANOXIN) tablet 250 mcg  250 mcg Oral Daily Jules Husbands, MD   250 mcg at 07/19/11 0918  . furosemide (LASIX) injection 40 mg  40 mg Intravenous Q8H Larey Dresser, MD   40 mg at 07/20/11 0906  . heparin injection 5,000 Units  5,000 Units Subcutaneous Q8H Jules Husbands, MD   5,000 Units at 07/20/11 0556  . insulin aspart (novoLOG) injection 5 Units  5 Units Subcutaneous TID [facility] Jules Husbands, MD   5 Units at 07/20/11 [phone]  . insulin glargine (LANTUS) injection 15 Units  15 Units Subcutaneous QHS Jules Husbands, MD   15 Units at 07/19/11 2212  . lisinopril (PRINIVIL,ZESTRIL) tablet 20 mg  20 mg Oral Daily Larey Dresser, MD   20 mg at 07/19/11 [phone]  . metoprolol succinate (TOPROL-XL) 24 hr tablet 150 mg  150 mg Oral Daily Larey Dresser, MD   150 mg at 07/19/11 0918  . nitroGLYCERIN (NITROSTAT) SL tablet 0.4 mg  0.4 mg Sublingual Q5 Min x 3 PRN Blair Heys, MD   0.4 mg at 07/16/11 2338  . ondansetron (ZOFRAN) injection 4 mg  4 mg Intravenous Q6H PRN Jules Husbands, MD   4 mg at 07/17/11 0514  . pantoprazole (PROTONIX) EC tablet 40 mg  40 mg Oral BID Jules Husbands, MD   40 mg at 07/19/11 2212  . simvastatin (ZOCOR) tablet 40 mg  40 mg Oral QPM Jules Husbands, MD   40 mg at 07/19/11 1745      Filed Vitals:   07/19/11 2007 07/20/11 0003 07/20/11 0413 07/20/11 0732  BP: 135/95 147/100 152/103 123/90  Pulse: 81 85 87 88  Temp: 98.1 F (36.7 C) 97.8 F (36.6 C) 97.7 F (36.5 C) 97.5 F (36.4 C)  TempSrc: Oral Oral  Oral Oral  Resp:   20 17  Height:      Weight:  116.1 kg (255 lb 15.3 oz)    SpO2: 95% 96% 96% 89%    Intake/Output Summary (Last 24 hours) at 07/20/11 0947 Last data filed at 07/20/11 0400  Gross per 24 hour  Intake    244 ml  Output   5550 ml  Net  -5306 ml    LABS: Basic Metabolic Panel:  Basename 07/20/11 0405 07/19/11 0340  NA 142 138  K 4.3 4.0  CL 100 101  CO2 32 28  GLUCOSE 143* 131*  BUN 24* 22  CREATININE 1.31 1.30  CALCIUM 9.4 9.1  MG -- --  PHOS -- --   Cardiac Enzymes:  Basename 07/17/11 2004 07/17/11 1208  CKTOTAL 70 84  CKMB 4.3* 4.7*  CKMBINDEX -- --  TROPONINI <0.30 <0.30    RADIOLOGY:  Ct Angio Chest W/cm &/or Wo Cm  07/17/2011  *RADIOLOGY REPORT*  Clinical Data: Chest pain and shortness of breath.  CT ANGIOGRAPHY CHEST  Technique:  Multidetector CT imaging of the chest using the standard protocol  during bolus administration of intravenous contrast. Multiplanar reconstructed images including MIPs were obtained and reviewed to evaluate the vascular anatomy.  Contrast: 121mL OMNIPAQUE IOHEXOL 350 MG/ML SOLN  Comparison: Chest radiograph performed 07/16/2011, and CTA of the chest performed 03/09/2008  Findings: There is no evidence of central pulmonary embolus. Evaluation for pulmonary embolus is suboptimal due to motion artifact.  Trace bilateral pleural effusions are noted, right greater than left.  Diffuse interstitial prominence is noted, with minimal bilateral opacities, suggestive of mild interstitial edema.  There is no evidence of pneumothorax.  No masses are identified; no abnormal focal contrast enhancement is seen.  The patient is status post median sternotomy.  The heart is mildly enlarged.  No mediastinal lymphadenopathy is seen; scattered mediastinal nodes remain normal in size.  The great vessels are unremarkable in appearance.  No pericardial effusion is identified. A left-sided AICD is noted, with a single lead at the right ventricle.  No  axillary lymphadenopathy is seen.  The visualized portions of the thyroid gland are unremarkable in appearance.  A small amount of ascites is noted surrounding the liver and spleen.  The visualized portions of the liver and spleen are otherwise unremarkable.  No acute osseous abnormalities are seen.  There is minimal chronic- appearing flattening of the T3 vertebral body.  IMPRESSION:  1.  No evidence of central pulmonary embolus. 2.  Trace bilateral pleural effusions, right greater than left, with diffuse interstitial prominence and minimal bilateral opacities, suggesting mild interstitial edema. 3.  Mild cardiomegaly noted. 4.  Small amount of ascites noted surrounding the liver and spleen.  Original Report Authenticated By: Santa Lighter, M.D.   PHYSICAL EXAM General: NAD Neck: supple  Lungs: Clear to auscultation bilaterally with normal respiratory effort. CV: RRR, 2/6 systolic murmur Abdomen: Soft, nontender, no hepatosplenomegaly, no distention.  Neurologic: Alert and oriented x 3.  Psych: Normal affect. Extremities: trace to 1+ edema; possible chord Right calf TELEMETRY: Reviewed telemetry pt in NSR  ASSESSMENT AND PLAN: 41 yo with nonischemic cardiomyopathy, recent GI bleed, and paroxysmal atrial fibrillation presented with acute on chronic systolic CHF.  1. CHF: Acute on chronic systolic.  He remains mldly volume overloaded today.  EF 20% with moderate MR and severe TR on echo. - Continue Lasix 40 mg IV q8hrs today. Follow renal function. - Increase lisinopril to 40 mg po daily as BP remains high. - Continue Toprol XL 150 mg daily.  - Continue digoxin => level was ok on 6/18.  2. Paroxysmal atrial fibrillation: He is in NSR.  Staying off Tikosyn due to issues with compliance.  He is off coumadin for now with recent GI bleed.  Will need to have him followup with GI as outpatient to consider restarting.  3. Right lower ext pain - venous dopplers to exclude DVT 4. Right eye pain and  blurred vision - etiology unclear; ? TIA, particularly in light of PAF; noncontrast head CT; will ask neurology to evaluate.   Kirk Ruths 07/20/2011 9:47 AM

## 2011-07-21 DIAGNOSIS — I6340 Cerebral infarction due to embolism of unspecified cerebral artery: Secondary | ICD-10-CM

## 2011-07-21 DIAGNOSIS — H5340 Unspecified visual field defects: Secondary | ICD-10-CM

## 2011-07-21 LAB — BASIC METABOLIC PANEL
BUN: 24 mg/dL — ABNORMAL HIGH (ref 6–23)
GFR calc non Af Amer: 72 mL/min — ABNORMAL LOW (ref >90)
Glucose, Bld: 125 mg/dL — ABNORMAL HIGH (ref 70–99)
Potassium: 3.9 mEq/L (ref 3.5–5.1)

## 2011-07-21 LAB — GLUCOSE, CAPILLARY

## 2011-07-21 MED ORDER — POTASSIUM CHLORIDE CRYS ER 20 MEQ PO TBCR
20.0000 meq | EXTENDED_RELEASE_TABLET | Freq: Once | ORAL | Status: AC
  Administered 2011-07-21: 20 meq via ORAL
  Filled 2011-07-21: qty 1

## 2011-07-21 MED ORDER — ATORVASTATIN CALCIUM 20 MG PO TABS
20.0000 mg | ORAL_TABLET | Freq: Every day | ORAL | Status: DC
  Administered 2011-07-21: 20 mg via ORAL
  Filled 2011-07-21 (×2): qty 1

## 2011-07-21 MED ORDER — AMLODIPINE BESYLATE 5 MG PO TABS
5.0000 mg | ORAL_TABLET | Freq: Every day | ORAL | Status: DC
  Administered 2011-07-21 – 2011-07-22 (×2): 5 mg via ORAL
  Filled 2011-07-21 (×2): qty 1

## 2011-07-21 MED ORDER — FUROSEMIDE 40 MG PO TABS
40.0000 mg | ORAL_TABLET | Freq: Two times a day (BID) | ORAL | Status: DC
  Administered 2011-07-21 – 2011-07-22 (×2): 40 mg via ORAL
  Filled 2011-07-21 (×5): qty 1

## 2011-07-21 NOTE — Progress Notes (Signed)
Patient had 3 episodes of vent bigeminy ranging from 2 to 3 minutes. Patient was asymptomatic. Alert and oriented, oxygen sats 96% on RA, blood pressure 136/85. Dr. Beckie Salts informed and new order received.

## 2011-07-21 NOTE — Progress Notes (Signed)
Patient ID: Corey Palmer, male   DOB: 1970/04/10, 41 y.o.   MRN: [id number]    SUBJECTIVE: No chest pain or dyspnea; pain in right calf resolved; blurred vision right eye for 3 days with a "pressure" feeling  Current Facility-Administered Medications  Medication Dose Route Frequency Provider Last Rate Last Dose  . acetaminophen (TYLENOL) tablet 650 mg  650 mg Oral Q4H PRN Jules Husbands, MD   650 mg at 07/17/11 1606  . aspirin EC tablet 81 mg  81 mg Oral Daily Jules Husbands, MD   81 mg at 07/20/11 0951  . digoxin (LANOXIN) tablet 250 mcg  250 mcg Oral Daily Jules Husbands, MD   250 mcg at 07/20/11 [phone]  . furosemide (LASIX) injection 40 mg  40 mg Intravenous Q8H Larey Dresser, MD   40 mg at 07/21/11 0757  . heparin injection 5,000 Units  5,000 Units Subcutaneous Q8H Jules Husbands, MD   5,000 Units at 07/21/11 0515  . insulin aspart (novoLOG) injection 5 Units  5 Units Subcutaneous TID [facility] Jules Husbands, MD   5 Units at 07/20/11 1818  . insulin glargine (LANTUS) injection 15 Units  15 Units Subcutaneous QHS Jules Husbands, MD   15 Units at 07/20/11 2213  . lisinopril (PRINIVIL,ZESTRIL) tablet 40 mg  40 mg Oral Daily Lelon Perla, MD      . metoprolol succinate (TOPROL-XL) 24 hr tablet 150 mg  150 mg Oral Daily Larey Dresser, MD   150 mg at 07/20/11 0951  . nitroGLYCERIN (NITROSTAT) SL tablet 0.4 mg  0.4 mg Sublingual Q5 Min x 3 PRN Blair Heys, MD   0.4 mg at 07/16/11 2338  . ondansetron (ZOFRAN) injection 4 mg  4 mg Intravenous Q6H PRN Jules Husbands, MD   4 mg at 07/17/11 0514  . pantoprazole (PROTONIX) EC tablet 40 mg  40 mg Oral BID Jules Husbands, MD   40 mg at 07/20/11 2212  . simvastatin (ZOCOR) tablet 40 mg  40 mg Oral QPM Jules Husbands, MD   40 mg at 07/20/11 1849  . DISCONTD: lisinopril (PRINIVIL,ZESTRIL) tablet 20 mg  20 mg Oral Daily Larey Dresser, MD   20 mg at 07/20/11 [phone]      Filed Vitals:   07/20/11 2337 07/21/11 0512 07/21/11 0515 07/21/11 0810  BP: 140/93  147/98 144/99  Pulse: 74  77  77  Temp: 98.6 F (37 C) 97.5 F (36.4 C)  97.5 F (36.4 C)  TempSrc: Oral Oral  Oral  Resp: 20 20  18   Height:      Weight:  113 kg (249 lb 1.9 oz)    SpO2: 97% 94%  91%    Intake/Output Summary (Last 24 hours) at 07/21/11 0823 Last data filed at 07/21/11 0513  Gross per 24 hour  Intake   1088 ml  Output   5850 ml  Net  -4762 ml    LABS: Basic Metabolic Panel:  Basename 07/21/11 0345 07/20/11 0405  NA 142 142  K 3.9 4.3  CL 99 100  CO2 33* 32  GLUCOSE 125* 143*  BUN 24* 24*  CREATININE 1.22 1.31  CALCIUM 9.7 9.4  MG -- --  PHOS -- --   RADIOLOGY:  Ct Angio Chest W/cm &/or Wo Cm  07/17/2011  *RADIOLOGY REPORT*  Clinical Data: Chest pain and shortness of breath.  CT ANGIOGRAPHY CHEST  Technique:  Multidetector CT imaging of the chest using the standard protocol during bolus administration of intravenous contrast. Multiplanar reconstructed  images including MIPs were obtained and reviewed to evaluate the vascular anatomy.  Contrast: 182mL OMNIPAQUE IOHEXOL 350 MG/ML SOLN  Comparison: Chest radiograph performed 07/16/2011, and CTA of the chest performed 03/09/2008  Findings: There is no evidence of central pulmonary embolus. Evaluation for pulmonary embolus is suboptimal due to motion artifact.  Trace bilateral pleural effusions are noted, right greater than left.  Diffuse interstitial prominence is noted, with minimal bilateral opacities, suggestive of mild interstitial edema.  There is no evidence of pneumothorax.  No masses are identified; no abnormal focal contrast enhancement is seen.  The patient is status post median sternotomy.  The heart is mildly enlarged.  No mediastinal lymphadenopathy is seen; scattered mediastinal nodes remain normal in size.  The great vessels are unremarkable in appearance.  No pericardial effusion is identified. A left-sided AICD is noted, with a single lead at the right ventricle.  No axillary lymphadenopathy is seen.  The visualized portions  of the thyroid gland are unremarkable in appearance.  A small amount of ascites is noted surrounding the liver and spleen.  The visualized portions of the liver and spleen are otherwise unremarkable.  No acute osseous abnormalities are seen.  There is minimal chronic- appearing flattening of the T3 vertebral body.  IMPRESSION:  1.  No evidence of central pulmonary embolus. 2.  Trace bilateral pleural effusions, right greater than left, with diffuse interstitial prominence and minimal bilateral opacities, suggesting mild interstitial edema. 3.  Mild cardiomegaly noted. 4.  Small amount of ascites noted surrounding the liver and spleen.  Original Report Authenticated By: Santa Lighter, M.D.   PHYSICAL EXAM General: NAD Neck: supple  Lungs: Clear to auscultation bilaterally with normal respiratory effort. CV: RRR, 2/6 systolic murmur apex Abdomen: Soft, nontender, no hepatosplenomegaly, no distention.  Neurologic: Alert and oriented x 3.  Psych: Normal affect. Extremities: trace edema TELEMETRY: Reviewed telemetry pt in NSR with pvcs  ASSESSMENT AND PLAN: 41 yo with nonischemic cardiomyopathy, recent GI bleed, and paroxysmal atrial fibrillation presented with acute on chronic systolic CHF.  1. CHF: Acute on chronic systolic.  Volume status improved.  EF 20% with moderate MR and severe TR on echo. - Change lasix to 40 mg po BID. Follow renal function. - Continue lisinopril 40 mg po daily. - Continue Toprol XL 150 mg daily.  - Continue digoxin => level was ok on 6/18.  2. Paroxysmal atrial fibrillation: He is in NSR.  Staying off Tikosyn due to issues with compliance.  He is off coumadin for now with recent GI bleed.  Will need to have him followup with GI as outpatient to consider restarting.  3. Right lower ext pain - venous dopplers to exclude DVT negative 4. Right eye pain and blurred vision - etiology unclear; ? TIA, particularly in light of PAF; noncontrast head CT ok; will ask neurology to  evaluate.  5. Hypertension - add norvasc 5 mg po daily Possible DC in AM if neurology eval unrevealing; if blurred vision felt to be a TIA, will need to consider anticoagulation despite recent GI bleed. Would need GI input first given recent diagnosis of ulcers  Kirk Ruths 07/21/2011 8:23 AM

## 2011-07-21 NOTE — Consult Note (Signed)
Referring Physician: Lovena Le    Chief Complaint: Visual disturbance.  HPI: Corey Palmer is an 41 y.o. male admitted 07/16/11 for treatment of CHF exacerbation and accelerated HTN.  On the day of admit, he noticed blurred VA right eye inferior visual field only and redness of right eye. No diploplia, pain, headaches, dizziness, slurred speech, or extremity weakness/numbness.   Neurology consult requested.    Patient has complicated PMH; atrial fibrillation not currently anticoagulated due to recent UGIB, uncontrolled DM2 on insulin, last hba1c 12.9, HTN, HLD and dilated cardiomyopathy with ICD.   LSN:07/15/11 tPA Given: no, recent GIB, outside window.  Past Medical History  Diagnosis Date  . Dilated cardiomyopathy     status post ICD placement in 2008 c/b tearing at the atrial junction resulting in tamponade and urgent thoracotomy  . Gout, unspecified   . Dyslipidemia   . Hypertension   . Diabetes uncomplicated adult-type II     on insulin therapy, a1c 12.9 in 01/2011  . Chronic systolic heart failure     2D echo (123456) - Systolic function severely reduced., LV EF 20-25%. Akinesis of apical and anteroseptal mycoardium.  last 2D echo (11/2008 ), LV EF 25-30%, diffuse hypokinesis, and grade 1 diastolic dysfunction.  Marland Kitchen CAD (coronary artery disease)     Cardiac cath (08/19/2000) - Nonobstructive, 40% stenosis of LAD,.   . Paroxysmal atrial fibrillation     on chronic coumadin, goal INR 2-3. status post direct current  . Obesity (BMI 30.0-34.9)   . Hepatic steatosis 2011    seen on ultrasound.   . Anginal pain   . Myocardial infarction 2008  . ICD (implantable cardiac defibrillator) in place 2007  . Shortness of breath   . Pacemaker   . CHF (congestive heart failure)   . Cancer 2013    benign stomach cancer  . Peptic ulcer     Past Surgical History  Procedure Date  . Icd placement   . Cardiac pacemaker placement   . Left total knee replacement   . Cholecystectomy   .  Esophagogastroduodenoscopy 04/05/2011    Procedure: ESOPHAGOGASTRODUODENOSCOPY (EGD);  Surgeon: Irene Shipper, MD;  Location: [facility] ENDOSCOPY;  Service: Endoscopy;  Laterality: N/A;  . Joint replacement     left knee replacement  . Cardiac catheterization     Family History  Problem Relation Age of Onset  . Diabetes Mother   . Coronary artery disease Mother 86    Died of MI at 27  . Gout Mother   . Heart disease Mother   . Heart attack Mother   . Diabetes Father   . Coronary artery disease Father 72    Died in a house fire   . Heart disease Father   . Heart disease Brother    Social History:  reports that he has never smoked. He has never used smokeless tobacco. He reports that he does not drink alcohol or use illicit drugs.  Allergies:  Allergies  Allergen Reactions  . Orange Fruit Anaphylaxis  . Penicillins Anaphylaxis   Medications: Scheduled:   . amLODipine  5 mg Oral Daily  . aspirin EC  81 mg Oral Daily  . atorvastatin  20 mg Oral q1800  . digoxin  250 mcg Oral Daily  . furosemide  40 mg Oral BID  . heparin  5,000 Units Subcutaneous Q8H  . insulin aspart  5 Units Subcutaneous TID AC  . insulin glargine  15 Units Subcutaneous QHS  . lisinopril  40 mg Oral  Daily  . metoprolol succinate  150 mg Oral Daily  . pantoprazole  40 mg Oral BID  . DISCONTD: furosemide  40 mg Intravenous Q8H  . DISCONTD: lisinopril  20 mg Oral Daily  . DISCONTD: simvastatin  40 mg Oral QPM   ROS: See HPI.  Presently, no chest pain, shortness of breath, edema, n/v/d/c.    Physical Examination: Blood pressure 144/99, pulse 77, temperature 97.5 F (36.4 C), temperature source Oral, resp. rate 18, height 6\' 3"  (1.905 m), weight 113 kg (249 lb 1.9 oz), SpO2 91.00%. Telemetry:SR with PVC  Neurologic Examination: Mental Status: Alert, oriented, thought content appropriate.  Speech fluent without evidence of aphasia. Able to follow 3 step commands without difficulty. Cranial Nerves: II- Blurred  VA inferior visual field OD, able to see movement and shapes, not details.  III/IV/VI-Extraocular movements intact.  Pupils reactive bilaterally. No hyphema, clouding of cornea.  Small lateral subconjunctival hemorrhage OD. V/VII-Smile symmetric VIII-grossly intact XI-bilateral shoulder shrug XII-midline tongue extension Motor: 5/5 bilaterally with normal tone and bulk Sensory: Pinprick and light touch intact throughout, bilaterally Deep Tendon Reflexes: 2+ and symmetric throughout Plantars: Downgoing bilaterally Cerebellar: Normal finger-to-nose, normal rapid alternating movements and normal heel-to-shin test.  Basic Metabolic Panel:  Lab [id number] 07/20/11 0405 07/17/11 0204  NA 142 142 --  K 3.9 4.3 --  CL 99 100 --  CO2 33* 32 --  GLUCOSE 125* 143* --  BUN 24* 24* --  CREATININE 1.22 1.31 --  CALCIUM 9.7 9.4 --  MG -- -- 1.8  PHOS -- -- --   Liver Function Tests:  Lab 07/16/11 2319 07/16/11 2133  AST 22 21  ALT 17 17  ALKPHOS 116 121*  BILITOT 0.5 0.4  PROT 6.3 6.3  ALBUMIN 3.1* 3.3*   CBC:  Lab 07/16/11 2133  WBC 6.3  NEUTROABS 4.1  HGB 15.7  HCT 47.5  MCV 81.3  PLT 207   Cardiac Enzymes:  Lab 07/17/11 2004 07/17/11 1208 07/17/11 0204  CKTOTAL 70 84 116  CKMB 4.3* 4.7* 5.2*  CKMBINDEX -- -- --  TROPONINI <0.30 <0.30 <0.30   BNP:  Lab 07/16/11 2320  PROBNP 2793.0*   D-Dimer:  Lab 07/16/11 2319  DDIMER 1.78*   CBG:  Lab 07/21/11 0813 07/20/11 2133 07/20/11 1602 07/20/11 1204 07/20/11 0730 07/19/11 2203  GLUCAP 115* 135* 101* 191* 126* 150*   Thyroid Function Tests:  Lab 07/17/11 0204  TSH 6.144*  T4TOTAL --  FREET4 --  T3FREE --  THYROIDAB --   Coagulation:  Lab 07/17/11 0204  LABPROT 15.4*  INR 1.19   Urine Drug Screen: Drugs of Abuse     Component Value Date/Time   LABOPIA NONE DETECTED 03/08/2008 0900   COCAINSCRNUR NONE DETECTED 03/08/2008 0900   LABBENZ NONE DETECTED 03/08/2008 0900   AMPHETMU NONE DETECTED 03/08/2008 0900    THCU NONE DETECTED 03/08/2008 0900   LABBARB  Value: NONE DETECTED        DRUG SCREEN FOR MEDICAL PURPOSES ONLY.  IF CONFIRMATION IS NEEDED FOR ANY PURPOSE, NOTIFY LAB WITHIN 5 DAYS.        LOWEST DETECTABLE LIMITS FOR URINE DRUG SCREEN Drug Class       Cutoff (ng/mL) Amphetamine      1000 Barbiturate      200 Benzodiazepine   [id number] Tricyclics       [id number] Opiates          300 Cocaine          300 THC  50 03/08/2008 0900   07/20/2011 CT HEAD WITHOUT CONTRAST  Technique:  Contiguous axial images were obtained from the base of the skull through the vertex without contrast.  Comparison: None.  Findings: Ventricular system normal in size and appearance for age. No mass lesion.  No midline shift.  No acute hemorrhage or hematoma.  No extra-axial fluid collections.  No evidence of acute infarction.  No skull fracture or other focal osseous abnormality involving the skull.  Visualized paranasal sinuses, mastoid air cells, and middle ear cavities well-aerated.  Mild bilateral carotid siphon and vertebral artery atherosclerosis.  IMPRESSION:  1.  No acute intracranial abnormality. 2.  Age advanced carotid siphon vertebral artery atherosclerosis.   Deniece Portela, M.D.   07/17/11 2D Echo-Left ventricle: The cavity size was severely dilated. Wall was increased in a pattern of mild LVH. The estimated ejection fraction was 20%. Diffuse hypokinesis. There is akinesis of the entireanteroseptal myocardium. - Mitral valve: Moderate regurgitation. - Left atrium: The atrium was moderately to severelydilated. - Right ventricle: The cavity size was moderately dilated. - Right atrium: The atrium was moderately dilated. - Tricuspid valve: Severe regurgitation.  Assessment: 41 y.o. male with blurred VA inferior visual field OD, no focal neurologic deficits.  Head CT negative for infarct, mass or bleed.  Unable to do MRI due to ICD.  Patient has multiple risk factors for stroke and therefore although not seen on CT can  not rule out the possibility of an acute ischemic event and patient soul be worked up and managed accordingly.  Retinal hemorrhage remains on the differential as well.  Patient states he has never seen a retinal specialist and has not had an eye exam in a few years.   A1C 12.9 02/18/11 Lipids TC 224, TG 355, HDL 23, LDL 130 03/04/11  Stroke Risk Factors - DM, HLD, HTN, AFib.  Plan: 1.  FLP, A1c 2.  Prophylactic therapy- asa 81; recent GIB precludes other agents  3.  Risk factor modification 4. Carotid dopplers 5.  Recommend opthalmology consult 6. Telemetry   Berlin Hun PA-C [facility] [phone] 07/21/2011, 9:35 AM  Patient seen and examined. Clinical course and management discussed.  Edits to above note made.  I agree with the above.  Alexis Goodell, MD [facility] [phone]  07/21/2011  4:49 PM

## 2011-07-22 ENCOUNTER — Inpatient Hospital Stay (HOSPITAL_COMMUNITY): Payer: Medicaid Other

## 2011-07-22 ENCOUNTER — Encounter (HOSPITAL_COMMUNITY): Payer: Self-pay | Admitting: Radiology

## 2011-07-22 DIAGNOSIS — H5340 Unspecified visual field defects: Secondary | ICD-10-CM

## 2011-07-22 DIAGNOSIS — R079 Chest pain, unspecified: Secondary | ICD-10-CM

## 2011-07-22 DIAGNOSIS — Z7901 Long term (current) use of anticoagulants: Secondary | ICD-10-CM

## 2011-07-22 DIAGNOSIS — K253 Acute gastric ulcer without hemorrhage or perforation: Secondary | ICD-10-CM

## 2011-07-22 DIAGNOSIS — I6340 Cerebral infarction due to embolism of unspecified cerebral artery: Secondary | ICD-10-CM

## 2011-07-22 LAB — BASIC METABOLIC PANEL
Calcium: 9.4 mg/dL (ref 8.4–10.5)
Chloride: 102 mEq/L (ref 96–112)
Creatinine, Ser: 1.02 mg/dL (ref 0.50–1.35)
GFR calc Af Amer: >90 mL/min (ref >90)

## 2011-07-22 LAB — GLUCOSE, CAPILLARY

## 2011-07-22 MED ORDER — ATORVASTATIN CALCIUM 20 MG PO TABS: 20.0000 mg | ORAL_TABLET | Freq: Every day | ORAL | Status: DC

## 2011-07-22 MED ORDER — METOPROLOL SUCCINATE ER 100 MG PO TB24: ORAL_TABLET | ORAL | Status: DC

## 2011-07-22 MED ORDER — AMLODIPINE BESYLATE 5 MG PO TABS: 5.0000 mg | ORAL_TABLET | Freq: Every day | ORAL | Status: DC

## 2011-07-22 MED ORDER — WARFARIN SODIUM 5 MG PO TABS: 5.0000 mg | ORAL_TABLET | Freq: Every day | ORAL | Status: DC

## 2011-07-22 MED ORDER — PANTOPRAZOLE SODIUM 40 MG PO TBEC: 40.0000 mg | DELAYED_RELEASE_TABLET | Freq: Every day | ORAL | Status: DC

## 2011-07-22 MED ORDER — SPIRONOLACTONE 25 MG PO TABS: 12.5000 mg | ORAL_TABLET | Freq: Every day | ORAL | Status: DC

## 2011-07-22 MED ORDER — IOHEXOL 350 MG/ML SOLN
50.0000 mL | Freq: Once | INTRAVENOUS | Status: AC | PRN
  Administered 2011-07-22: 50 mL via INTRAVENOUS

## 2011-07-22 MED ORDER — OMEPRAZOLE 40 MG PO CPDR: 40.0000 mg | DELAYED_RELEASE_CAPSULE | Freq: Every day | ORAL | Status: DC

## 2011-07-22 MED ORDER — LISINOPRIL 40 MG PO TABS: 40.0000 mg | ORAL_TABLET | Freq: Every day | ORAL | Status: DC

## 2011-07-22 NOTE — Progress Notes (Signed)
Pt discharged to home. Medication and follow-up appt. information given to patient and family. Heart failure discharge packet given and reviewed with patient. Pt verbalized understanding. Corey Palmer

## 2011-07-22 NOTE — Progress Notes (Signed)
Stroke Team Progress Note  HISTORY Corey Palmer is an 41 y.o. male admitted 07/16/11 for treatment of CHF exacerbation and accelerated HTN. On the day of admit, he noticed blurred VA right eye inferior visual field only and redness of right eye. No diploplia, pain, headaches, dizziness, slurred speech, or extremity weakness/numbness. Neurology consult requested.  Patient has complicated PMH; atrial fibrillation not currently anticoagulated due to recent UGIB, uncontrolled DM2 on insulin, last hba1c 12.9, HTN, HLD and dilated cardiomyopathy with ICD.  Patient was not a TPA candidate secondary to recent GIB, outside the window. He was admitted for further evaluation and treatment.  SUBJECTIVE No family is at the bedside.  Overall he feels his condition is completely resolved. He describes a horizontal lower half vision loss in one eye only accompanied by blurry vision, eye redness and congestion.  OBJECTIVE Most recent Vital Signs: Filed Vitals:   07/21/11 1925 07/22/11 0138 07/22/11 0527 07/22/11 0723  BP: 136/85 128/96 120/84 128/94  Pulse:  73 70 75  Temp: 98 F (36.7 C) 98.6 F (37 C) 97.8 F (36.6 C) 98.6 F (37 C)  TempSrc: Oral Oral Oral Oral  Resp:   20 18  Height:      Weight:   112 kg (246 lb 14.6 oz)   SpO2: 96% 99% 95% 97%   CBG (last 3)  Basename 07/21/11 2140 07/21/11 1649 07/21/11 1214  GLUCAP 145* 165* 139*   Intake/Output from previous day: 06/23 0701 - 06/24 0700 In: 1080 [P.O.:1080] Out: 2525 [Urine:2525]  IV Fluid Intake:     MEDICATIONS    . amLODipine  5 mg Oral Daily  . aspirin EC  81 mg Oral Daily  . atorvastatin  20 mg Oral q1800  . digoxin  250 mcg Oral Daily  . furosemide  40 mg Oral BID  . heparin  5,000 Units Subcutaneous Q8H  . insulin aspart  5 Units Subcutaneous TID AC  . insulin glargine  15 Units Subcutaneous QHS  . lisinopril  40 mg Oral Daily  . metoprolol succinate  150 mg Oral Daily  . pantoprazole  40 mg Oral BID  . potassium  chloride  20 mEq Oral Once   PRN:  acetaminophen, nitroGLYCERIN, ondansetron (ZOFRAN) IV  Diet:  Cardiac thin liquids Activity:  Ambulate in hall  DVT Prophylaxis:  Heparin 5000 units sq tid  CLINICALLY SIGNIFICANT STUDIES Basic Metabolic Panel:  Lab [id number] 07/21/11 0345 07/17/11 0204  NA 141 142 --  K 4.4 3.9 --  CL 102 99 --  CO2 29 33* --  GLUCOSE 147* 125* --  BUN 25* 24* --  CREATININE 1.02 1.22 --  CALCIUM 9.4 9.7 --  MG -- -- 1.8  PHOS -- -- --   Liver Function Tests:  Lab 07/16/11 2319 07/16/11 2133  AST 22 21  ALT 17 17  ALKPHOS 116 121*  BILITOT 0.5 0.4  PROT 6.3 6.3  ALBUMIN 3.1* 3.3*   CBC:  Lab 07/16/11 2133  WBC 6.3  NEUTROABS 4.1  HGB 15.7  HCT 47.5  MCV 81.3  PLT 207   Coagulation:  Lab 07/17/11 0204  LABPROT 15.4*  INR 1.19   Cardiac Enzymes:  Lab 07/17/11 2004 07/17/11 1208 07/17/11 0204  CKTOTAL 70 84 116  CKMB 4.3* 4.7* 5.2*  CKMBINDEX -- -- --  TROPONINI <0.30 <0.30 <0.30   Urinalysis: No results found for this basename: COLORURINE:2,APPERANCEUR:2,LABSPEC:2,PHURINE:2,GLUCOSEU:2,HGBUR:2,BILIRUBINUR:2,KETONESUR:2,PROTEINUR:2,UROBILINOGEN:2,NITRITE:2,LEUKOCYTESUR:2 in the last 168 hours Lipid Panel    Component Value Date/Time  CHOL 224* 03/04/2011 1535   TRIG 355* 03/04/2011 1535   HDL 23* 03/04/2011 1535   CHOLHDL 9.7 03/04/2011 1535   VLDL 71* 03/04/2011 1535   LDLCALC 130* 03/04/2011 1535   HgbA1C  Lab Results  Component Value Date   HGBA1C 12.9* 02/18/2011    Urine Drug Screen:     Component Value Date/Time   LABOPIA NONE DETECTED 03/08/2008 0900   COCAINSCRNUR NONE DETECTED 03/08/2008 0900   LABBENZ NONE DETECTED 03/08/2008 0900   AMPHETMU NONE DETECTED 03/08/2008 0900   THCU NONE DETECTED 03/08/2008 0900   LABBARB  Value: NONE DETECTED        DRUG SCREEN FOR MEDICAL PURPOSES ONLY.  IF CONFIRMATION IS NEEDED FOR ANY PURPOSE, NOTIFY LAB WITHIN 5 DAYS.        LOWEST DETECTABLE LIMITS FOR URINE DRUG SCREEN Drug Class       Cutoff  (ng/mL) Amphetamine      1000 Barbiturate      200 Benzodiazepine   [id number] Tricyclics       [id number] Opiates          300 Cocaine          300 THC              50 03/08/2008 0900    Alcohol Level: No results found for this basename: ETH:2 in the last 168 hours   CT of the brain  07/20/2011   1.  No acute intracranial abnormality. 2.  Age advanced carotid siphon vertebral artery atherosclerosis.  CT angio of the brain  CT angio of the neck  MRI of the brain  pacer  MRA of the brain  pacer  2D Echocardiogram  07/17/2011 EF 20%. Diffuse hypokinesis, akinesis of the entire anteroseptal myocardium  Carotid Doppler  See CT angio neck  LE Venous Doppler Right lower extremity venous duplex completed. Preliminary report is negative for DVT, SVT, or a Baker's cyst in the right leg. Negative for DVT in the left common femoral vein.  CXR  07/16/2011 Bilateral central airspace opacities raise question for mild interstitial edema, with underlying chronic vascular congestion. Cardiomegaly appears less prominent than on the prior study.  EKG  sinus tachycardia.   Therapy Recommendations PT -, OT -  Physical Exam  Pleasant young Caucasian male not in distress.Awake alert. Afebrile. Head is nontraumatic. Neck is supple without bruit. Hearing is normal. Cardiac exam no murmur or gallop. Lungs are clear to auscultation. Distal pulses are well felt.  Neurological Exam : Awake  Alert oriented x 3. Normal speech and language.eye movements full without nystagmus.Fundi not visualized. Visual acuity seems preserved. Face symmetric. Tongue midline. Normal strength, tone, reflexes and coordination. Normal sensation. Gait deferred.  ASSESSMENT [name] is a 41 y.o. male with right eye pain and visual abnormality not related to stroke/TIA, likely secondary to retinal ischemia/hemorrhage associated with his diabetes.  On no anticoagulants secondary to receng UGIB prior to admission. Now on no anticoagulants for same  reason for secondary stroke prevention. Patient with no resultant visual abnormalities this morning.  -hyperlipidemia,  LDL 130 -diabetes, uncontrolled, HgbA1c 12.9 -atrial fibrillation -CHF, EF 20%, non ischemic, acute on chronic systolic -RLU pain, neg for VTE -accelerated hypertension -recent UGIB -CAD, MI -obesity, Body mass index is 30.86 kg/(m^2).   Hospital day # 6  TREATMENT/PLAN -recommend OP opthamology evalutaion -will check CTA   SHARON BIBY, AVNP, ANP-BC, GNP-BC [facility] Pager: [phone] 07/22/2011 8:35 AM  Dr. Antony Contras, Stroke  Center Medical Director, has personally reviewed chart, pertinent data, examined the patient and developed the plan of care. Pager:  [phone]

## 2011-07-22 NOTE — Progress Notes (Signed)
Corey Palmer Daily Rounding Note 07/22/2011, 10:01 AM  SUBJECTIVE:       Pt has been compliant with twice daily Protonix since 3'/2012 egd revealed multiple antral ulcers. However not compliant with some of his heart meds.   He has been off coumadin since then per suggestion of Dr Henrene Pastor.  Did not follow up for Palmer ROV on 4/11, says he could not afford the 60 dollar office visit fee, so the issue of restarting Coumadin was not addressed.   He has not used any ASA or NSAIDs,  Has no Palmer problems. Eating well, has managed to drop some weight in last 3 months, but regained 6 of these within a few days of admission 07/17/11 with chest pain, LE edema, 4 pillow orthopnea, BNP 2800. ICD has not fired.  Dyspnea has resolved with diuretics.   Resolved transient blurring vision and partial hemianopsia on right concerning to Dr Corey Palmer for TIA and he would like to restart the patient's Coumadin. Neurology feels this is not TIA, but more likely retinal pathology a/w his diabetes ( ischemia vs hemorrhage?) Has been receiving subcutaneous Heparin while inpt.   ENDOSCOPIC STUDIES: 04/05/2011. EGD for hemetemesis  ENDOSCOPIC IMPRESSION:  1) Ulcers, multiple in the antrum (see above)  2) Otherwise normal exam  RECOMMENDATIONS:  1) Continue PPI bid  2) Await biopsy results - r/o MALT  3) MAY BE BEST TO HOLD COUMADIN, GIVEN DEGREE OF ULCERATION AND  RECENT BLEEDING, FOR NOW. PRIMARY TO DECIDE IF RISK POFILE  REASONABLE  Stomach, biopsy  ULCERATED GASTRIC MUCOSA. NO HELICOBACTER PYLORI, DYSPLASIA OR MALIGNANCY. Was no show for Palmer ROV 05/09/11 despite 2 reminders.  Pt says he called and cancelled the appt.    Colonoscopy at least 20 years ago when he was having rectal bleeding. He was told that constipation had led to some tearing of the rectum that caused the bleeding.  Colonoscopy about 10 years ago when he has lost 100 or so pounds and was having abdominal pain. He says his study was negative.   Meds .  amLODipine  5 mg Oral Daily  . aspirin EC  81 mg Oral Daily  . atorvastatin  20 mg Oral q1800  . digoxin  250 mcg Oral Daily  . furosemide  40 mg Oral BID  . heparin  5,000 Units Subcutaneous Q8H  . insulin aspart  5 Units Subcutaneous TID AC  . insulin glargine  15 Units Subcutaneous QHS  . lisinopril  40 mg Oral Daily  . metoprolol succinate  150 mg Oral Daily  . pantoprazole  40 mg Oral BID  . potassium chloride  20 mEq Oral Once    OBJECTIVE:        General: looks well.  Overweight.   Vital signs in last 24 hours:    Temp:  [97.8 F (36.6 C)-98.6 F (37 C)] 98.6 F (37 C) (06/24 0723) Pulse Rate:  [70-82] 75  (06/24 0723) Resp:  [18-20] 18  (06/24 0723) BP: (120-140)/(81-96) 128/94 mmHg (06/24 0723) SpO2:  [95 %-99 %] 97 % (06/24 0723) Weight:   246 # 14.6 oz (112 kg) compared to 266# on 04/03/2101 Last BM Date: 07/21/11  Heart: RRR, 2/6 systolic murmer Chest: Clear B.  No SOB or cough. ICD on left.  Abdomen: soft, NT, obese, no mass or bruits  Extremities: trace pedal edema Neuro/Psych:  Pleasant, not confused, disoriented or anxious.   Intake/Output from previous day: 06/23 0701 - 06/24 0700 In: 1080 [P.O.:1080]  Out: 2525 [Urine:2525]  Intake/Output this shift: Total I/O In: 240 [P.O.:240] Out: 900 [Urine:900]  Lab Results:   Ref. Range 07/16/2011 21:33  WBC Latest Range: 4.0-10.5 K/uL 6.3  RBC Latest Range: 4.22-5.81 MIL/uL 5.84 (H)  Hemoglobin Latest Range: 13.0-17.0 g/dL 15.7  HCT Latest Range: 39.0-52.0 % 47.5  MCV Latest Range: 78.0-100.0 fL 81.3  MCH Latest Range: 26.0-34.0 pg 26.9  MCHC Latest Range: 30.0-36.0 g/dL 33.1  RDW Latest Range: 11.5-15.5 % 14.8  Platelets Latest Range: 150-400 K/uL 207    BMET  Basename 07/22/11 0450 07/21/11 0345 07/20/11 0405  NA 141 142 142  K 4.4 3.9 4.3  CL 102 99 100  CO2 29 33* 32  GLUCOSE 147* 125* 143*  BUN 25* 24* 24*  CREATININE 1.02 1.22 1.31  CALCIUM 9.4 9.7 9.4   Studies/Results: Ct Head Wo  Contrast  07/20/2011  *RADIOLOGY REPORT*  Clinical Data: Recent presentation with chest pain, history of hypertension and diabetes, now presenting with hyperemia/hemorrhage in the right thigh and visual disturbances.  CT HEAD WITHOUT CONTRAST  Technique:  Contiguous axial images were obtained from the base of the skull through the vertex without contrast.  Comparison: None.  Findings: Ventricular system normal in size and appearance for age. No mass lesion.  No midline shift.  No acute hemorrhage or hematoma.  No extra-axial fluid collections.  No evidence of acute infarction.  No skull fracture or other focal osseous abnormality involving the skull.  Visualized paranasal sinuses, mastoid air cells, and middle ear cavities well-aerated.  Mild bilateral carotid siphon and vertebral artery atherosclerosis.  IMPRESSION:  1.  No acute intracranial abnormality. 2.  Age advanced carotid siphon vertebral artery atherosclerosis.  Original Report Authenticated By: Deniece Portela, M.D.    ASSESMENT: *  Palmer bleed from ulcerative gastritis/gastric ulcers in early 03/2011.  Pt compliant with bid PPI, so these should be healed. H & H stable. * Acute on Chronic systolic heart failure. Dilated cardiomyopathy status post ICD placement in 2008.  2D echo (123456) - Systolic function severely reduced., LV EF 20-25%. Akinesis of apical and anteroseptal mycoardium. last 2D echo (11/2008 ), LV EF 25-30%, diffuse hypokinesis, and grade 1 diastolic dysfunction.  *  Visual disturbance, resolved.  * Dyslipidemia  * Hypertension  * Diabetes type 2, insulin requiring *  Visual disturbance , resolved.  * CAD (coronary artery disease)  Cardiac cath (08/19/2000) - Nonobstructive, 40% stenosis of LAD,.  * Paroxysmal atrial fibrillation  on chronic coumadin, goal INR 2-3. status post direct current  * Obesity (BMI 30.0-34.9)  * Hepatic steatosis 2011 seen on ultrasound.   PLAN: *  Ok to restart coumadin.  Does not need a  follow up EGD.   *  Change Protonix to once daily, for life.     LOS: 6 days   Corey Palmer  07/22/2011, 10:01 AM Pager: [phone]  Patient seen and I agree with the above documentation including the assessment and plan. OK to restart Coumadin. Life long Protonix may not be affordable, Prilosec OTC or Ranitidine OTC would be a suitable compromise.

## 2011-07-22 NOTE — Discharge Summary (Addendum)
CARDIOLOGY DISCHARGE SUMMARY   Patient ID: Corey Palmer MRN: [id number] DOB/AGE: Feb 27, 1970 41 y.o.  Admit date: 07/16/2011 Discharge date: 07/22/2011  Primary Discharge Diagnosis:  Acute on chronic systolic CHF Secondary Discharge Diagnosis:  Past Medical History  Diagnosis Date  . Dilated cardiomyopathy     status post ICD placement in 2008 c/b tearing at the atrial junction resulting in tamponade and urgent thoracotomy  . Gout, unspecified   . Dyslipidemia   . Hypertension   . Diabetes uncomplicated adult-type II     on insulin therapy, a1c 12.9 in 01/2011  . Chronic systolic heart failure     2D echo (123456) - Systolic function severely reduced., LV EF 20-25%. Akinesis of apical and anteroseptal mycoardium.  last 2D echo (11/2008 ), LV EF 25-30%, diffuse hypokinesis, and grade 1 diastolic dysfunction.  Marland Kitchen CAD (coronary artery disease)     Cardiac cath (08/19/2000) - Nonobstructive, 40% stenosis of LAD,.   . Paroxysmal atrial fibrillation     on chronic coumadin, goal INR 2-3. status post direct current  . Obesity (BMI 30.0-34.9)   . Hepatic steatosis 2011    seen on ultrasound.   . Anginal pain   . Myocardial infarction 2008  . ICD (implantable cardiac defibrillator) in place 2007  . Shortness of breath   . Pacemaker   . CHF (congestive heart failure)   . Cancer 2013    benign stomach cancer  . Peptic ulcer     Consults: GI, neurology  Procedures: Two-view chest x-ray, CT of the head without contrast, CT angiogram of the neck with/without contrast, 2-D echocardiogram, lower extremity Doppler  Hospital Course: Corey Palmer is a 41 year old male with a history of nonischemic cardiomyopathy. He also has a history of paroxysmal atrial fibrillation but anticoagulation was discontinued in January after GI bleed. He had a volume overload and described as PND and orthopnea. He came to the hospital and was admitted for further evaluation and treatment.  He had a recent history  of hematemesis and was restarted on his proton pump inhibitor. He was started on IV Lasix for diuresis. His weight decreased from 271 pounds to 246 pounds during his hospital stay. As his volume status improved, his respiratory status improved. His electrolytes were followed closely during his hospital stay potassium was supplemented as needed. A 2-D echocardiogram was performed in his EF was 20% with a PAS of 63.  He had some nonsustained VT but was asymptomatic. His potassium was maintained as > 4 and his magnesium was within normal limits as well. He had been on a beta blocker prior to admission and this was continued, albeit at a slightly lower dose. He had pain in his right calf but lower extremity Dopplers were negative for DVT, other thrombus or Baker's cyst.  He had some prolonged visual disturbances that did eventually resolve. A neurology consult was called since he had not been anticoagulated prior to admission. He had the radiologic studies listed below. No MRI was done because of his pacemaker. These did not show any acute infarct. They felt that this was possibly a TIA and he should get a retinal exam as an outpatient. Because of the possible TIA, a GI consult was called to evaluate the risk of Coumadin after his GI bleed. He was put on a proton pump inhibitor.  For his PAF, consideration was given to Tikosyn but this had been discontinued previously because of issues with compliance. He was continued on his beta blocker. He maintained  sinus rhythm during his hospital stay.   On 07/22/2011, his final status was felt at baseline. He was evaluated by Dr. Aundra Palmer as well as neurology and Dr. Olevia Palmer with gastroenterology. Dr. Olevia Palmer reviewed all the data and felt that his ulcers found in March of 2013 should be healed. His hemoglobin and hematocrit were stable. She felt it was appropriate to restart his Coumadin without a repeat EGD but he should be on lifelong PPI. The coumadin will be followed  closely with the goal INR 2.0-2.5. Both neurology and gastroenterology cleared Corey Palmer for discharge. He is therefore considered stable for discharge on 07/22/2011 with close outpatient followup arranged.  Labs:  Lab Results  Component Value Date   WBC 6.3 07/16/2011   HGB 15.7 07/16/2011   HCT 47.5 07/16/2011   MCV 81.3 07/16/2011   PLT 207 07/16/2011    Lab 07/22/11 0450 07/16/11 2319  NA 141 --  K 4.4 --  CL 102 --  CO2 29 --  BUN 25* --  CREATININE 1.02 --  CALCIUM 9.4 --  PROT -- 6.3  BILITOT -- 0.5  ALKPHOS -- 116  ALT -- 17  AST -- 22  GLUCOSE 147* --   Magnesium  Date Value Range Status  07/17/2011 1.8  1.5 - 2.5 mg/dL Final   Lipid Panel     Component Value Date/Time   CHOL 224* 03/04/2011 1535   TRIG 355* 03/04/2011 1535   HDL 23* 03/04/2011 1535   CHOLHDL 9.7 03/04/2011 1535   VLDL 71* 03/04/2011 1535   LDLCALC 130* 03/04/2011 1535    Pro B Natriuretic peptide (BNP)  Date/Time Value Range Status  07/16/2011 11:20 PM 2793.0* 0 - 125 pg/mL Final  02/18/2011  3:09 PM 1349.0* 0 - 125 pg/mL Final   Radiology:  Dg Chest 2 View 07/16/2011  *RADIOLOGY REPORT*  Clinical Data: Sharp pain over left breast, with nausea, vomiting, dry heaving and shortness of breath.  CHEST - 2 VIEW  Comparison: Chest radiograph performed 02/18/2011  Findings: The lungs are well-aerated.  Bilateral central airspace opacity raises question for mild interstitial edema, with chronic underlying vascular congestion.  There is no evidence of pleural effusion or pneumothorax.  Cardiomegaly is mildly less prominent than on the prior study.  The patient is status post median sternotomy.  An AICD is noted at the left chest wall, with a single lead ending at the right ventricle. No acute osseous abnormalities are seen.  Clips are noted within the right upper quadrant, reflecting prior cholecystectomy.  IMPRESSION: Bilateral central airspace opacities raise question for mild interstitial edema, with underlying chronic  vascular congestion. Cardiomegaly appears less prominent than on the prior study.  Original Report Authenticated By: Corey Palmer, M.D.   Ct Head Wo Contrast 07/20/2011  *RADIOLOGY REPORT*  Clinical Data: Recent presentation with chest pain, history of hypertension and diabetes, now presenting with hyperemia/hemorrhage in the right thigh and visual disturbances.  CT HEAD WITHOUT CONTRAST  Technique:  Contiguous axial images were obtained from the base of the skull through the vertex without contrast.  Comparison: None.  Findings: Ventricular system normal in size and appearance for age. No mass lesion.  No midline shift.  No acute hemorrhage or hematoma.  No extra-axial fluid collections.  No evidence of acute infarction.  No skull fracture or other focal osseous abnormality involving the skull.  Visualized paranasal sinuses, mastoid air cells, and middle ear cavities well-aerated.  Mild bilateral carotid siphon and vertebral artery atherosclerosis.  IMPRESSION:  1.  No acute intracranial abnormality. 2.  Age advanced carotid siphon vertebral artery atherosclerosis.  Original Report Authenticated By: Deniece Portela, M.D.   Ct Angio Neck W/cm &/or Wo/cm 07/22/2011  *RADIOLOGY REPORT*  Clinical Data:  Resolved partial blurred vision right eye only inferior visual field.  CT ANGIOGRAPHY HEAD AND NECK  Technique:  Multidetector CT imaging of the head and neck was performed using the standard protocol during bolus administration of intravenous contrast.  Multiplanar CT image reconstructions including MIPs were obtained to evaluate the vascular anatomy. Carotid stenosis measurements (when applicable) are obtained utilizing NASCET criteria, using the distal internal carotid diameter as the denominator.  Contrast: 49mL OMNIPAQUE IOHEXOL 350 MG/ML SOLN  Comparison:  Noncontrast CT head 07/20/2011    CTA NECK  Findings:  There is no proximal stenosis or occlusion of the great vessels.  Normal transverse arch.   Carotid bifurcations widely patent without significant plaque or calcification. There is no evidence for carotid dissection.  Cervical internal carotid arteries are widely patent.  Both vertebral arteries are patent through their course.  There is no visible ostial stenosis.  There is minimal calcification of the right vertebral artery as it pierces the dura but no visible stenosis.  No neck masses are present.  The lung apices are clear.  Previous CABG.  Left subclavian transvenous pacer.  Mild cervical spondylosis.   Review of the MIP images confirms the above findings.  IMPRESSION: Unremarkable CT angiography of the extracranial circulation.    CTA HEAD  Findings:  No evidence for acute stroke, intracranial hemorrhage, mass lesion, hydrocephalus, or extra-axial fluid.  Post infusion, no abnormal intracranial enhancement.  There is no proximal stenosis or occlusion of the internal carotid arteries. Mild nonstenotic inferior cavernous calcification can be seen on the right and left.  The basilar artery is widely patent with both vertebrals contributing.  The anterior, middle, and posterior cerebral arteries are widely patent.  There is no visible cerebellar branch occlusion.  I see no intracranial aneurysm.  Both ophthalmic arteries appear patent.  Globes are symmetric and there is no visible orbital mass.   Review of the MIP images confirms the above findings.  IMPRESSION: Negative CT angiography intracranial circulation.  Original Report Authenticated By: Staci Righter, M.D.   Ct Angio Chest W/cm &/or Wo Cm 07/17/2011  *RADIOLOGY REPORT*  Clinical Data: Chest pain and shortness of breath.  CT ANGIOGRAPHY CHEST  Technique:  Multidetector CT imaging of the chest using the standard protocol during bolus administration of intravenous contrast. Multiplanar reconstructed images including MIPs were obtained and reviewed to evaluate the vascular anatomy.  Contrast: 18mL OMNIPAQUE IOHEXOL 350 MG/ML SOLN  Comparison:  Chest radiograph performed 07/16/2011, and CTA of the chest performed 03/09/2008  Findings: There is no evidence of central pulmonary embolus. Evaluation for pulmonary embolus is suboptimal due to motion artifact.  Trace bilateral pleural effusions are noted, right greater than left.  Diffuse interstitial prominence is noted, with minimal bilateral opacities, suggestive of mild interstitial edema.  There is no evidence of pneumothorax.  No masses are identified; no abnormal focal contrast enhancement is seen.  The patient is status post median sternotomy.  The heart is mildly enlarged.  No mediastinal lymphadenopathy is seen; scattered mediastinal nodes remain normal in size.  The great vessels are unremarkable in appearance.  No pericardial effusion is identified. A left-sided AICD is noted, with a single lead at the right ventricle.  No axillary lymphadenopathy is seen.  The visualized portions of the thyroid  gland are unremarkable in appearance.  A small amount of ascites is noted surrounding the liver and spleen.  The visualized portions of the liver and spleen are otherwise unremarkable.  No acute osseous abnormalities are seen.  There is minimal chronic- appearing flattening of the T3 vertebral body.  IMPRESSION:  1.  No evidence of central pulmonary embolus. 2.  Trace bilateral pleural effusions, right greater than left, with diffuse interstitial prominence and minimal bilateral opacities, suggesting mild interstitial edema. 3.  Mild cardiomegaly noted. 4.  Small amount of ascites noted surrounding the liver and spleen.  Original Report Authenticated By: Corey Palmer, M.D.   EKG: 17-Jul-2011 07:19:29  Sinus rhythm with occasional Premature ventricular complexes Possible Left atrial enlargement Left ventricular hypertrophy with repolarization abnormality Prolonged QT Abnormal ECG 71mm/s 66mm/mV 100Hz  8.0.1 12SL 241 HD CID: [id number] Referred by: Alejandro Mulling Confirmed By: Lauree Chandler MD Vent. rate 98  BPM PR interval 198 ms QRS duration 104 ms QT/QTc 384/490 ms P-R-T axes 60 37 191  Echo: 07/17/2011 Study Conclusions - Left ventricle: The cavity size was severely dilated. Wall thickness was increased in a pattern of mild LVH. The estimated ejection fraction was 20%. Diffuse hypokinesis. There is akinesis of the entireanteroseptal myocardium. - Mitral valve: Moderate regurgitation. - Left atrium: The atrium was moderately to severely dilated. - Right ventricle: The cavity size was moderately dilated. - Right atrium: The atrium was moderately dilated. - Tricuspid valve: Severe regurgitation. - Pulmonary arteries: PA peak pressure: 19mm Hg (S).  FOLLOW UP PLANS AND APPOINTMENTS Allergies  Allergen Reactions  . Orange Fruit Anaphylaxis  . Penicillins Anaphylaxis   Medication List  As of 08/13/2011  6:47 AM   STOP taking these medications         metoprolol succinate 100 MG 24 hr tablet      pantoprazole 40 MG tablet      simvastatin 40 MG tablet         TAKE these medications         amLODipine 5 MG tablet   Commonly known as: NORVASC   Take 1 tablet (5 mg total) by mouth daily.      atorvastatin 20 MG tablet   Commonly known as: LIPITOR   Take 1 tablet (20 mg total) by mouth daily at 6 PM.      colchicine 0.6 MG tablet   Take 0.6 mg by mouth daily as needed. For gout flare ups      furosemide 40 MG tablet   Commonly known as: LASIX   Take 1 tablet (40 mg total) by mouth 2 (two) times daily.      insulin aspart 100 UNIT/ML injection   Commonly known as: novoLOG   Inject 5 Units into the skin 3 (three) times daily before meals.      insulin glargine 100 UNIT/ML injection   Commonly known as: LANTUS   Inject 15 Units into the skin at bedtime.      lisinopril 40 MG tablet   Commonly known as: PRINIVIL,ZESTRIL   Take 1 tablet (40 mg total) by mouth daily.      nitroGLYCERIN 0.4 MG SL tablet   Commonly known as: NITROSTAT   Place 0.4 mg under the tongue  every 5 (five) minutes as needed. For chest pain.      omeprazole 40 MG capsule   Commonly known as: PRILOSEC   Take 1 capsule (40 mg total) by mouth daily.      warfarin 5 MG tablet  Commonly known as: COUMADIN   Take 1 tablet (5 mg total) by mouth daily. Or as directed.            Discharge Orders    Future Appointments: Provider: Department: Dept Phone: Center:   07/25/2011 2:30 PM for labs and 2:45 PM INR [facility] [phone] None   08/02/2011 11:00 AM Larey Dresser, [place] [facility]   08/08/2011 9:30 AM Evans Lance, [place] [phone] [facility]   08/28/2011 2:45 PM Lou Cal, MD [facility] [phone] [facility]     Follow-up Information    Follow up with Loralie Champagne, MD. (July 5th at 11 am)    Contact information:   [id number] [place] [phone]       Follow up with [facility]. (Coumadin check 6-27 at 2:45 pm after BMET at 2:30 pm)    Contact information:   [place]          BRING ALL MEDICATIONS WITH YOU TO FOLLOW UP APPOINTMENTS  Time spent with patient to include physician time: 42 min Signed: Rosaria Ferries 07/22/2011, 1:52 PM Co-Sign MD

## 2011-07-22 NOTE — Progress Notes (Signed)
Patient ID: Corey Palmer, male   DOB: [birth year], 41 y.o.   MRN: [id number]    SUBJECTIVE: No chest pain or dyspnea; pain in right calf resolved.  He had blurring of his lower visual field in the right eye for > 24 hours.  This has now resolved.  Concern for possible TIA.  He has diuresed well.   Current Facility-Administered Medications  Medication Dose Route Frequency Provider Last Rate Last Dose  . acetaminophen (TYLENOL) tablet 650 mg  650 mg Oral Q4H PRN Jules Husbands, MD   650 mg at 07/17/11 1606  . amLODipine (NORVASC) tablet 5 mg  5 mg Oral Daily Lelon Perla, MD   5 mg at 07/21/11 1035  . aspirin EC tablet 81 mg  81 mg Oral Daily Jules Husbands, MD   81 mg at 07/21/11 1034  . atorvastatin (LIPITOR) tablet 20 mg  20 mg Oral q1800 Lelon Perla, MD   20 mg at 07/21/11 1929  . digoxin (LANOXIN) tablet 250 mcg  250 mcg Oral Daily Jules Husbands, MD   250 mcg at 07/21/11 1034  . furosemide (LASIX) tablet 40 mg  40 mg Oral BID Lelon Perla, MD   40 mg at 07/21/11 1928  . heparin injection 5,000 Units  5,000 Units Subcutaneous Q8H Jules Husbands, MD   5,000 Units at 07/22/11 0601  . insulin aspart (novoLOG) injection 5 Units  5 Units Subcutaneous TID [facility] Jules Husbands, MD   5 Units at 07/21/11 1929  . insulin glargine (LANTUS) injection 15 Units  15 Units Subcutaneous QHS Jules Husbands, MD   15 Units at 07/21/11 2246  . lisinopril (PRINIVIL,ZESTRIL) tablet 40 mg  40 mg Oral Daily Lelon Perla, MD   40 mg at 07/21/11 1035  . metoprolol succinate (TOPROL-XL) 24 hr tablet 150 mg  150 mg Oral Daily Larey Dresser, MD   150 mg at 07/21/11 1035  . nitroGLYCERIN (NITROSTAT) SL tablet 0.4 mg  0.4 mg Sublingual Q5 Min x 3 PRN Blair Heys, MD   0.4 mg at 07/16/11 2338  . ondansetron (ZOFRAN) injection 4 mg  4 mg Intravenous Q6H PRN Jules Husbands, MD   4 mg at 07/17/11 0514  . pantoprazole (PROTONIX) EC tablet 40 mg  40 mg Oral BID Jules Husbands, MD   40 mg at 07/21/11 2246  . potassium chloride SA  (K-DUR,KLOR-CON) CR tablet 20 mEq  20 mEq Oral Once Evans Lance, MD   20 mEq at 07/21/11 2248  . DISCONTD: furosemide (LASIX) injection 40 mg  40 mg Intravenous Q8H Larey Dresser, MD   40 mg at 07/21/11 0757  . DISCONTD: simvastatin (ZOCOR) tablet 40 mg  40 mg Oral QPM Jules Husbands, MD   40 mg at 07/20/11 1849      Filed Vitals:   07/21/11 1925 07/22/11 0138 07/22/11 0527 07/22/11 0723  BP: 136/85 128/96 120/84 128/94  Pulse:  73 70 75  Temp: 98 F (36.7 C) 98.6 F (37 C) 97.8 F (36.6 C) 98.6 F (37 C)  TempSrc: Oral Oral Oral Oral  Resp:   20 18  Height:      Weight:   112 kg (246 lb 14.6 oz)   SpO2: 96% 99% 95% 97%    Intake/Output Summary (Last 24 hours) at 07/22/11 0747 Last data filed at 07/22/11 0139  Gross per 24 hour  Intake   1080 ml  Output   2525 ml  Net  -1445 ml  LABS: Basic Metabolic Panel:  Basename 07/22/11 0450 07/21/11 0345  NA 141 142  K 4.4 3.9  CL 102 99  CO2 29 33*  GLUCOSE 147* 125*  BUN 25* 24*  CREATININE 1.02 1.22  CALCIUM 9.4 9.7  MG -- --  PHOS -- --   RADIOLOGY:  Ct Angio Chest W/cm &/or Wo Cm  07/17/2011  *RADIOLOGY REPORT*  Clinical Data: Chest pain and shortness of breath.  CT ANGIOGRAPHY CHEST  Technique:  Multidetector CT imaging of the chest using the standard protocol during bolus administration of intravenous contrast. Multiplanar reconstructed images including MIPs were obtained and reviewed to evaluate the vascular anatomy.  Contrast: 176mL OMNIPAQUE IOHEXOL 350 MG/ML SOLN  Comparison: Chest radiograph performed 07/16/2011, and CTA of the chest performed 03/09/2008  Findings: There is no evidence of central pulmonary embolus. Evaluation for pulmonary embolus is suboptimal due to motion artifact.  Trace bilateral pleural effusions are noted, right greater than left.  Diffuse interstitial prominence is noted, with minimal bilateral opacities, suggestive of mild interstitial edema.  There is no evidence of pneumothorax.  No  masses are identified; no abnormal focal contrast enhancement is seen.  The patient is status post median sternotomy.  The heart is mildly enlarged.  No mediastinal lymphadenopathy is seen; scattered mediastinal nodes remain normal in size.  The great vessels are unremarkable in appearance.  No pericardial effusion is identified. A left-sided AICD is noted, with a single lead at the right ventricle.  No axillary lymphadenopathy is seen.  The visualized portions of the thyroid gland are unremarkable in appearance.  A small amount of ascites is noted surrounding the liver and spleen.  The visualized portions of the liver and spleen are otherwise unremarkable.  No acute osseous abnormalities are seen.  There is minimal chronic- appearing flattening of the T3 vertebral body.  IMPRESSION:  1.  No evidence of central pulmonary embolus. 2.  Trace bilateral pleural effusions, right greater than left, with diffuse interstitial prominence and minimal bilateral opacities, suggesting mild interstitial edema. 3.  Mild cardiomegaly noted. 4.  Small amount of ascites noted surrounding the liver and spleen.  Original Report Authenticated By: Santa Lighter, M.D.   PHYSICAL EXAM General: NAD Neck: supple, JVP 8-9 cm Lungs: Clear to auscultation bilaterally with normal respiratory effort. CV: RRR, 2/6 systolic murmur apex Abdomen: Soft, nontender, no hepatosplenomegaly, no distention.  Neurologic: Alert and oriented x 3.  Psych: Normal affect. Extremities: trace edema  TELEMETRY: Reviewed telemetry pt in NSR with pvcs  ASSESSMENT AND PLAN: 41 yo with nonischemic cardiomyopathy, recent GI bleed, and paroxysmal atrial fibrillation presented with acute on chronic systolic CHF.  1. CHF: Acute on chronic systolic.  Volume status improved.  EF 20% with moderate MR and severe TR on echo. - Continue po Lasix at current dose. - Continue lisinopril 40 mg po daily. - Continue Toprol XL 150 mg daily.  - Continue digoxin =>  level was ok on 6/18.  - Add low dose spironolactone 12.5 mg daily.  2. Paroxysmal atrial fibrillation: He is in NSR.  Staying off Tikosyn due to issues with compliance.  He is off coumadin for now with recent GI bleed.  Given possible TIA this admission, I am going to ask GI to see him and comment on risk of coumadin with INR run 2-2.5 (would stop coumadin).  He needs to be on a PPI, will start.  3. Right lower ext pain - venous dopplers to exclude DVT negative 4. Right eye pain and  blurred vision (lower visual field on right) - Now resolved.  Etiology unclear; ? TIA, particularly in light of PAF; noncontrast head CT ok . Neurology evaluated, cannot rule out TIA.  Should also get retinal exam as outpatient.   5. Hypertension -  Better today.  6. Disposition: Home this afternoon after evaluation by GI. Would start coumadin INR goal 2-2.5 followed in our office and stop ASA if this is ok'd by GI.  Otherwise, leave on ASA 81.  Needs followup in 1 week with me.  Needs EP followup with [facility].  Needs BMET in 1 week prior to office appointment.  Meds: Coumadin versus aspirin 81, omeprazole 40, Toprol XL 150 daily, lisinopril 40 daily, spironolactone 12.5 daily, amlodipine 5, atorvastatin 20.   Loralie Champagne 07/22/2011 7:47 AM

## 2011-07-24 ENCOUNTER — Ambulatory Visit (INDEPENDENT_AMBULATORY_CARE_PROVIDER_SITE_OTHER): Payer: Medicaid Other | Admitting: *Deleted

## 2011-07-24 DIAGNOSIS — Z7901 Long term (current) use of anticoagulants: Secondary | ICD-10-CM

## 2011-07-24 DIAGNOSIS — I4891 Unspecified atrial fibrillation: Secondary | ICD-10-CM

## 2011-07-24 DIAGNOSIS — I10 Essential (primary) hypertension: Secondary | ICD-10-CM

## 2011-07-24 NOTE — Patient Instructions (Signed)

## 2011-07-25 ENCOUNTER — Other Ambulatory Visit: Payer: Medicaid Other

## 2011-07-25 LAB — BASIC METABOLIC PANEL
CO2: 25 mEq/L (ref 19–32)
Calcium: 9.1 mg/dL (ref 8.4–10.5)
Chloride: 105 mEq/L (ref 96–112)
Potassium: 4.4 mEq/L (ref 3.5–5.1)
Sodium: 138 mEq/L (ref 135–145)

## 2011-07-30 ENCOUNTER — Encounter: Payer: Self-pay | Admitting: *Deleted

## 2011-08-02 ENCOUNTER — Ambulatory Visit (INDEPENDENT_AMBULATORY_CARE_PROVIDER_SITE_OTHER): Payer: Medicaid Other | Admitting: *Deleted

## 2011-08-02 ENCOUNTER — Encounter: Payer: Self-pay | Admitting: Cardiology

## 2011-08-02 ENCOUNTER — Ambulatory Visit (INDEPENDENT_AMBULATORY_CARE_PROVIDER_SITE_OTHER): Payer: Medicaid Other | Admitting: Cardiology

## 2011-08-02 VITALS — BP 130/90 | HR 87 | Ht 75.0 in | Wt 240.0 lb

## 2011-08-02 DIAGNOSIS — I4891 Unspecified atrial fibrillation: Secondary | ICD-10-CM

## 2011-08-02 DIAGNOSIS — I428 Other cardiomyopathies: Secondary | ICD-10-CM

## 2011-08-02 DIAGNOSIS — I5022 Chronic systolic (congestive) heart failure: Secondary | ICD-10-CM

## 2011-08-02 DIAGNOSIS — Z7901 Long term (current) use of anticoagulants: Secondary | ICD-10-CM

## 2011-08-02 LAB — BASIC METABOLIC PANEL
BUN: 37 mg/dL — ABNORMAL HIGH (ref 6–23)
Chloride: 98 mEq/L (ref 96–112)
Potassium: 4.6 mEq/L (ref 3.5–5.1)

## 2011-08-02 LAB — BRAIN NATRIURETIC PEPTIDE: Pro B Natriuretic peptide (BNP): 80 pg/mL (ref 0.0–100.0)

## 2011-08-02 LAB — POCT INR: INR: 2.4

## 2011-08-02 MED ORDER — SPIRONOLACTONE 25 MG PO TABS: 25.0000 mg | ORAL_TABLET | Freq: Every day | ORAL | Status: DC

## 2011-08-02 NOTE — Patient Instructions (Addendum)
Your physician recommends that you schedule a follow-up appointment in: [place]  Your physician recommends that you HAVE LAB WORK TODAY=BMP/BNP/DIG LEVEL  Your physician recommends that you return for lab work in: 2 [facility]

## 2011-08-04 NOTE — Progress Notes (Signed)
Patient ID: Corey Palmer, male   DOB: 06-02-1970, 41 y.o.   MRN: [id number] PCP: Dr. Victorio Palm  41 yo with history of nonischemic cardiomyopathy and paroxysmal atrial fibrillation presents for cardiology followup after recent admission with acute on chronic systolic CHF.  Patient was admitted in 6/13 with severe dyspnea.  He was diuresed to near-euvolemia.  He was seen by GI and restarted on coumadin.  Coumadin had been stopped in 3/12 in the setting of an upper GI bleed from PUD.    Since discharge, he has been doing well.  No exertional dyspnea though he has not been very active.  Main complaint today is neuropathic pain in his feet bilaterally.  No orthopnea, PND, or chest pain.   ECG: NSR, LAE, LVH  Labs (6/13): K 4.4, creatinine 1.0, BNP 2793, digoxin 0.6  PMH:  1. Type II diabetes with peripheral neuropathy.  2. Hyperlipidemia 3. Atrial fibrillation: Paroxysmal. He was on dofetilide in the past but was not compliant with it.  He was taken off coumadin with upper GI bleed in 3/12 but is now back on coumadin.  4. Upper GI bleed: From antral ulcers, was in 3/12.  5. Gout 6. Dilated cardiomyopathy: Nonischemic.  Medtronic ICD in [id number] (complicated by RA perforation and tamponade).  LHL in 2002 with 40% LAD stenosis. Echo (6/13) with EF 20%, akinetic anteroseptum, moderate MR, severe TR, moderately dilated RV.   SH: Unemployed truck Geophysicist/field seismologist.  Lives [place].  Married.  Nonsmoker.    FH: CHF.   ROS: All systems reviewed and negative except as per HPI.   Current Outpatient Prescriptions  Medication Sig Dispense Refill  . amLODipine (NORVASC) 5 MG tablet Take 1 tablet (5 mg total) by mouth daily.  30 tablet  11  . atorvastatin (LIPITOR) 20 MG tablet Take 1 tablet (20 mg total) by mouth daily at 6 PM.  30 tablet  11  . colchicine 0.6 MG tablet Take 0.6 mg by mouth daily as needed. For gout flare ups      . digoxin (LANOXIN) 0.25 MG tablet Take 1 tablet (250 mcg total) by mouth daily.   30 tablet  6  . furosemide (LASIX) 40 MG tablet Take 1 tablet (40 mg total) by mouth 2 (two) times daily.  60 tablet  6  . insulin aspart (NOVOLOG) 100 UNIT/ML injection Inject 5 Units into the skin 3 (three) times daily before meals.      . insulin glargine (LANTUS SOLOSTAR) 100 UNIT/ML injection Inject 15 Units into the skin at bedtime.  15 Syringe  3  . lisinopril (PRINIVIL,ZESTRIL) 40 MG tablet Take 1 tablet (40 mg total) by mouth daily.  30 tablet  11  . metoprolol succinate (TOPROL XL) 100 MG 24 hr tablet Take 1-1/2 tabs daily. Take with or immediately following a meal.  45 tablet  11  . nitroGLYCERIN (NITROSTAT) 0.4 MG SL tablet Place 0.4 mg under the tongue every 5 (five) minutes as needed. For chest pain.      Marland Kitchen omeprazole (PRILOSEC) 40 MG capsule Take 1 capsule (40 mg total) by mouth daily.  30 capsule  11  . pantoprazole (PROTONIX) 40 MG tablet Take 1 tablet by mouth 2 (two) times daily.      Marland Kitchen spironolactone (ALDACTONE) 25 MG tablet Take 1 tablet (25 mg total) by mouth daily.  30 tablet  11  . warfarin (COUMADIN) 5 MG tablet Take 1 tablet (5 mg total) by mouth daily. Or as directed.  [place]  tablet  11    BP 130/90  Pulse 87  Ht 6\' 3"  (1.905 m)  Wt 108.863 kg (240 lb)  BMI 30.00 kg/m2  SpO2 98% General: NAD Neck: No JVD, no thyromegaly or thyroid nodule.  Lungs: Clear to auscultation bilaterally with normal respiratory effort. CV: Nondisplaced PMI.  Heart regular S1/S2, no S3/S4, 2/6 murmur LLSB.  No peripheral edema.  No carotid bruit.  Normal pedal pulses.  Abdomen: Soft, nontender, no hepatosplenomegaly, no distention.  Skin: Intact without lesions or rashes.  Neurologic: Alert and oriented x 3.  Psych: Normal affect. Extremities: No clubbing or cyanosis.  HEENT: Normal.

## 2011-08-04 NOTE — Assessment & Plan Note (Signed)
NYHA class II symptoms.  He looks euvolemic on exam.  Nonischemic cardiomyopathy.   - Continue current Lasix, lisinopril, and Toprol XL doses.  - Continue digoxin at 0.25 mg daily but will check a level.  - Increase spironolactone to 25 mg daily.  - BMET in 2 wks.   - Followup in 1 month.

## 2011-08-04 NOTE — Assessment & Plan Note (Signed)
Patient remains in NSR.  He is off dofetilide due to noncompliance.  I have started him back on coumadin.  He had an upper GI bleed from antral ulcers in 3/12.  He was seen by GI during his recent admission.  He was deemed safe to restart coumadin as long as he remains on a PPI.  He is on Protonix.

## 2011-08-06 ENCOUNTER — Other Ambulatory Visit: Payer: Self-pay | Admitting: *Deleted

## 2011-08-06 DIAGNOSIS — I428 Other cardiomyopathies: Secondary | ICD-10-CM

## 2011-08-06 MED ORDER — SPIRONOLACTONE 25 MG PO TABS: 25.0000 mg | ORAL_TABLET | Freq: Every day | ORAL | Status: DC

## 2011-08-06 MED ORDER — DIGOXIN 125 MCG PO TABS: 0.1250 mg | ORAL_TABLET | Freq: Every day | ORAL | Status: DC

## 2011-08-06 NOTE — Progress Notes (Signed)
Pt notified to decrease digoxin to 0.125mg  daily. He will return for digoxin level 08/16/11

## 2011-08-08 ENCOUNTER — Ambulatory Visit (INDEPENDENT_AMBULATORY_CARE_PROVIDER_SITE_OTHER): Payer: Medicaid Other | Admitting: Internal Medicine

## 2011-08-08 ENCOUNTER — Encounter: Payer: Self-pay | Admitting: Internal Medicine

## 2011-08-08 ENCOUNTER — Ambulatory Visit (INDEPENDENT_AMBULATORY_CARE_PROVIDER_SITE_OTHER): Payer: Medicaid Other | Admitting: *Deleted

## 2011-08-08 ENCOUNTER — Telehealth: Payer: Self-pay | Admitting: *Deleted

## 2011-08-08 VITALS — BP 112/78 | HR 82 | Ht 75.0 in | Wt 243.0 lb

## 2011-08-08 DIAGNOSIS — I5043 Acute on chronic combined systolic (congestive) and diastolic (congestive) heart failure: Secondary | ICD-10-CM

## 2011-08-08 DIAGNOSIS — I428 Other cardiomyopathies: Secondary | ICD-10-CM

## 2011-08-08 DIAGNOSIS — I4891 Unspecified atrial fibrillation: Secondary | ICD-10-CM

## 2011-08-08 DIAGNOSIS — I5022 Chronic systolic (congestive) heart failure: Secondary | ICD-10-CM

## 2011-08-08 DIAGNOSIS — Z95810 Presence of automatic (implantable) cardiac defibrillator: Secondary | ICD-10-CM

## 2011-08-08 DIAGNOSIS — Z7901 Long term (current) use of anticoagulants: Secondary | ICD-10-CM

## 2011-08-08 LAB — ICD DEVICE OBSERVATION
BATTERY VOLTAGE: 3.1151 V
CHARGE TIME: 5.084 s
FVT: 0
HV IMPEDENCE: 68 Ohm
RV LEAD AMPLITUDE: 23.25 mv
RV LEAD IMPEDENCE ICD: 532 Ohm
RV LEAD THRESHOLD: 0.625 V
TZAT-0001FASTVT: 1
TZAT-0001SLOWVT: 1
TZAT-0002FASTVT: NEGATIVE
TZAT-0012FASTVT: 170 ms
TZAT-0012SLOWVT: 170 ms
TZAT-0018FASTVT: NEGATIVE
TZAT-0020SLOWVT: 1.5 ms
TZON-0003SLOWVT: 360 ms
TZON-0003VSLOWVT: 330 ms
TZON-0004SLOWVT: 16
TZON-0004VSLOWVT: 80
TZST-0001FASTVT: 2
TZST-0001FASTVT: 4
TZST-0001FASTVT: 6
TZST-0001SLOWVT: 2
TZST-0001SLOWVT: 3
TZST-0001SLOWVT: 4
TZST-0002FASTVT: NEGATIVE
TZST-0002FASTVT: NEGATIVE
TZST-0002FASTVT: NEGATIVE
TZST-0002FASTVT: NEGATIVE
TZST-0002SLOWVT: NEGATIVE
TZST-0002SLOWVT: NEGATIVE
VF: 0

## 2011-08-08 LAB — POCT INR: INR: 1.5

## 2011-08-08 MED ORDER — METOPROLOL SUCCINATE ER 100 MG PO TB24: ORAL_TABLET | ORAL | Status: DC

## 2011-08-08 NOTE — Assessment & Plan Note (Signed)
His heart failure symptoms remain class II. He will continue his current medical therapy and maintain a low-sodium diet.

## 2011-08-08 NOTE — Assessment & Plan Note (Signed)
His device is working normally. His fluid index is well controlled. We'll plan to recheck in several months.

## 2011-08-08 NOTE — Patient Instructions (Addendum)
Your physician recommends that you schedule a follow-up appointment in: 3 months with device clinic and 12 months with Dr Lovena Le

## 2011-08-08 NOTE — Assessment & Plan Note (Signed)
He is maintaining sinus rhythm. He will continue his current chronic anticoagulation.

## 2011-08-08 NOTE — Telephone Encounter (Signed)
Spoke with pt 08/06/11  about lab done 08/02/11. Pt made aware to decrease digoxin to 0.125mg  daily.  Pt scheduled to come for digoxin level 08/16/11 at time of BMET.

## 2011-08-08 NOTE — Progress Notes (Signed)
HPI [name] returns today for followup. He is a pleasant 41 yo man with a longstanding cardiomyopathy and chronic systolic heart failure, status post ICD implantation. The patient was really hospitalized with heart failure exacerbation. He has been restarted on anticoagulation. In the interim, he has done well. He is taking his medications and has maintain a low-sodium diet. He denies syncope. No peripheral edema. Allergies  Allergen Reactions  . Orange Fruit Anaphylaxis  . Penicillins Anaphylaxis     Current Outpatient Prescriptions  Medication Sig Dispense Refill  . amLODipine (NORVASC) 5 MG tablet Take 1 tablet (5 mg total) by mouth daily.  30 tablet  11  . atorvastatin (LIPITOR) 20 MG tablet Take 1 tablet (20 mg total) by mouth daily at 6 PM.  30 tablet  11  . colchicine 0.6 MG tablet Take 0.6 mg by mouth daily as needed. For gout flare ups      . digoxin (LANOXIN) 0.125 MG tablet Take 1 tablet (0.125 mg total) by mouth daily.  30 tablet  6  . furosemide (LASIX) 40 MG tablet Take 1 tablet (40 mg total) by mouth 2 (two) times daily.  60 tablet  6  . insulin aspart (NOVOLOG) 100 UNIT/ML injection Inject 5 Units into the skin 3 (three) times daily before meals.      . insulin glargine (LANTUS SOLOSTAR) 100 UNIT/ML injection Inject 15 Units into the skin at bedtime.  15 Syringe  3  . lisinopril (PRINIVIL,ZESTRIL) 40 MG tablet Take 1 tablet (40 mg total) by mouth daily.  30 tablet  11  . metoprolol succinate (TOPROL XL) 100 MG 24 hr tablet Take 1-1/2 tabs daily. Take with or immediately following a meal.  45 tablet  11  . nitroGLYCERIN (NITROSTAT) 0.4 MG SL tablet Place 0.4 mg under the tongue every 5 (five) minutes as needed. For chest pain.      Marland Kitchen omeprazole (PRILOSEC) 40 MG capsule Take 1 capsule (40 mg total) by mouth daily.  30 capsule  11  . oxyCODONE-acetaminophen (PERCOCET) 5-325 MG per tablet as needed.      . pantoprazole (PROTONIX) 40 MG tablet Take 1 tablet by mouth 2 (two) times  daily.      Marland Kitchen spironolactone (ALDACTONE) 25 MG tablet Take 1 tablet (25 mg total) by mouth daily.  30 tablet  6  . warfarin (COUMADIN) 5 MG tablet Take 1 tablet (5 mg total) by mouth daily. Or as directed.  60 tablet  11  . DISCONTD: metoprolol succinate (TOPROL XL) 100 MG 24 hr tablet Take 1-1/2 tabs daily. Take with or immediately following a meal.  45 tablet  11     Past Medical History  Diagnosis Date  . Dilated cardiomyopathy     status post ICD placement in 2008 c/b tearing at the atrial junction resulting in tamponade and urgent thoracotomy  . Gout, unspecified   . Dyslipidemia   . Hypertension   . Diabetes uncomplicated adult-type II     on insulin therapy, a1c 12.9 in 01/2011  . Chronic systolic heart failure     2D echo (123456) - Systolic function severely reduced., LV EF 20-25%. Akinesis of apical and anteroseptal mycoardium.  last 2D echo (11/2008 ), LV EF 25-30%, diffuse hypokinesis, and grade 1 diastolic dysfunction.  Marland Kitchen CAD (coronary artery disease)     Cardiac cath (08/19/2000) - Nonobstructive, 40% stenosis of LAD,.   . Paroxysmal atrial fibrillation     on chronic coumadin, goal INR 2-3. status post direct  current  . Obesity (BMI 30.0-34.9)   . Hepatic steatosis 2011    seen on ultrasound.   . Anginal pain   . Myocardial infarction 2008  . ICD (implantable cardiac defibrillator) in place 2007  . Shortness of breath   . Pacemaker   . CHF (congestive heart failure)   . Cancer 2013    benign stomach cancer  . Peptic ulcer     ROS:   All systems reviewed and negative except as noted in the HPI.   Past Surgical History  Procedure Date  . Icd placement   . Cardiac pacemaker placement   . Left total knee replacement   . Cholecystectomy   . Esophagogastroduodenoscopy 04/05/2011    Procedure: ESOPHAGOGASTRODUODENOSCOPY (EGD);  Surgeon: Irene Shipper, MD;  Location: [facility] ENDOSCOPY;  Service: Endoscopy;  Laterality: N/A;  . Joint replacement     left knee  replacement  . Cardiac catheterization      Family History  Problem Relation Age of Onset  . Diabetes Mother   . Coronary artery disease Mother 51    Died of MI at 57  . Gout Mother   . Heart disease Mother   . Heart attack Mother   . Diabetes Father   . Coronary artery disease Father 63    Died in a house fire   . Heart disease Father   . Heart disease Brother      History   Social History  . Marital Status: Married    Spouse Name: N/A    Number of Children: 3  . Years of Education: college   Occupational History  . bar tender    Social History Main Topics  . Smoking status: Never Smoker   . Smokeless tobacco: Never Used  . Alcohol Use: No  . Drug Use: No  . Sexually Active: Not on file   Other Topics Concern  . Not on file   Social History Narrative   Lives in [place].Studied sports medicine in college, got a BS - used to work with the [employer].     BP 112/78  Pulse 82  Ht 6\' 3"  (1.905 m)  Wt 243 lb (110.224 kg)  BMI 30.37 kg/m2  SpO2 97%  Physical Exam:  Well appearing middle-aged man, NAD HEENT: Unremarkable Neck:  7 cm JVD, no thyromegally Lungs:  Clear with no wheezes, rales, or rhonchi. HEART:  Regular rate rhythm, no murmurs, no rubs, no clicks Abd:  soft, positive bowel sounds, no organomegally, no rebound, no guarding Ext:  2 plus pulses, no edema, no cyanosis, no clubbing Skin:  No rashes no nodules Neuro:  CN II through XII intact, motor grossly intact  DEVICE  Normal device function.  See PaceArt for details.   Assess/Plan:

## 2011-08-16 ENCOUNTER — Ambulatory Visit (INDEPENDENT_AMBULATORY_CARE_PROVIDER_SITE_OTHER): Payer: Medicaid Other | Admitting: *Deleted

## 2011-08-16 ENCOUNTER — Other Ambulatory Visit: Payer: Self-pay | Admitting: *Deleted

## 2011-08-16 ENCOUNTER — Other Ambulatory Visit (INDEPENDENT_AMBULATORY_CARE_PROVIDER_SITE_OTHER): Payer: Medicaid Other

## 2011-08-16 DIAGNOSIS — I4891 Unspecified atrial fibrillation: Secondary | ICD-10-CM

## 2011-08-16 DIAGNOSIS — I428 Other cardiomyopathies: Secondary | ICD-10-CM

## 2011-08-16 DIAGNOSIS — Z7901 Long term (current) use of anticoagulants: Secondary | ICD-10-CM

## 2011-08-16 LAB — BASIC METABOLIC PANEL
BUN: 22 mg/dL (ref 6–23)
Calcium: 8.5 mg/dL (ref 8.4–10.5)
Creatinine, Ser: 0.9 mg/dL (ref 0.4–1.5)
GFR: 104.75 mL/min (ref >60.00)
Glucose, Bld: 352 mg/dL — ABNORMAL HIGH (ref 70–99)

## 2011-08-16 LAB — POCT INR: INR: 1.3

## 2011-08-16 MED ORDER — POTASSIUM CHLORIDE CRYS ER 20 MEQ PO TBCR: 20.0000 meq | EXTENDED_RELEASE_TABLET | Freq: Every day | ORAL | Status: DC

## 2011-08-23 ENCOUNTER — Ambulatory Visit (INDEPENDENT_AMBULATORY_CARE_PROVIDER_SITE_OTHER): Payer: Medicaid Other | Admitting: Pharmacist

## 2011-08-23 DIAGNOSIS — I4891 Unspecified atrial fibrillation: Secondary | ICD-10-CM

## 2011-08-23 DIAGNOSIS — Z7901 Long term (current) use of anticoagulants: Secondary | ICD-10-CM

## 2011-08-23 LAB — POCT INR: INR: 1.6

## 2011-08-28 ENCOUNTER — Encounter: Payer: Medicaid Other | Admitting: Internal Medicine

## 2011-08-30 ENCOUNTER — Ambulatory Visit (INDEPENDENT_AMBULATORY_CARE_PROVIDER_SITE_OTHER): Payer: Medicaid Other

## 2011-08-30 DIAGNOSIS — I4891 Unspecified atrial fibrillation: Secondary | ICD-10-CM

## 2011-08-30 DIAGNOSIS — Z7901 Long term (current) use of anticoagulants: Secondary | ICD-10-CM

## 2011-09-04 ENCOUNTER — Encounter: Payer: Self-pay | Admitting: Cardiology

## 2011-09-04 ENCOUNTER — Ambulatory Visit (INDEPENDENT_AMBULATORY_CARE_PROVIDER_SITE_OTHER): Payer: Medicaid Other | Admitting: Cardiology

## 2011-09-04 VITALS — BP 117/81 | HR 80 | Ht 75.0 in | Wt 250.0 lb

## 2011-09-04 DIAGNOSIS — I5022 Chronic systolic (congestive) heart failure: Secondary | ICD-10-CM

## 2011-09-04 DIAGNOSIS — I4891 Unspecified atrial fibrillation: Secondary | ICD-10-CM

## 2011-09-04 DIAGNOSIS — R9431 Abnormal electrocardiogram [ECG] [EKG]: Secondary | ICD-10-CM

## 2011-09-04 DIAGNOSIS — I2510 Atherosclerotic heart disease of native coronary artery without angina pectoris: Secondary | ICD-10-CM

## 2011-09-04 MED ORDER — METOPROLOL SUCCINATE ER 200 MG PO TB24: 200.0000 mg | ORAL_TABLET | Freq: Every day | ORAL | Status: DC

## 2011-09-04 NOTE — Patient Instructions (Signed)
Your physician recommends that you have lab work today--BMET/BNP/Magnesium  Increase Toprol XL (metoprolol succinate) to 200mg  daily. You can take two 100mg  tablets daily at the same time.  Your physician recommends that you schedule a follow-up appointment in: 2 months with Dr Aundra Dubin.

## 2011-09-05 DIAGNOSIS — R9431 Abnormal electrocardiogram [ECG] [EKG]: Secondary | ICD-10-CM | POA: Insufficient documentation

## 2011-09-05 LAB — BASIC METABOLIC PANEL
BUN: 20 mg/dL (ref 6–23)
Calcium: 8.8 mg/dL (ref 8.4–10.5)
Creatinine, Ser: 1.1 mg/dL (ref 0.4–1.5)
GFR: 82.06 mL/min (ref >60.00)

## 2011-09-05 LAB — BRAIN NATRIURETIC PEPTIDE: Pro B Natriuretic peptide (BNP): 121 pg/mL — ABNORMAL HIGH (ref 0.0–100.0)

## 2011-09-05 NOTE — Assessment & Plan Note (Signed)
QT interval is long on ECG today.  This may be related to diuresis with low K and Mg.  I will check BMET and Mg today.

## 2011-09-05 NOTE — Assessment & Plan Note (Signed)
Nonobstructive.  Will need to reassess lipids at next appointment.  He is on atorvastatin.

## 2011-09-05 NOTE — Assessment & Plan Note (Signed)
Patient remains in NSR.  He is off dofetilide due to noncompliance.  I have started him back on coumadin.  He had an upper GI bleed from antral ulcers in 3/12.  He was seen by GI during his recent admission.  He was deemed safe to restart coumadin as long as he remains on a PPI.  He is on Protonix.  Goal INR with coumadin will be 2-2.5.

## 2011-09-05 NOTE — Assessment & Plan Note (Signed)
Stable, NYHA class II symptoms currently.  Doing quite well. He does not appear volume overloaded on exam.  He has an ICD.   - Increase Toprol XL to 200 mg daily today.  - Continue current doses of digoxin (level ok recently), lisinopril, spironolactone.   - At next appointment, if he remains stable, I will arrange a CPX.

## 2011-09-05 NOTE — Progress Notes (Signed)
Patient ID: Corey Palmer, male   DOB: [birth year], 41 y.o.   MRN: [id number] PCP: Dr. Victorio Palm  41 yo with history of nonischemic cardiomyopathy and paroxysmal atrial fibrillation presents for cardiology followup after recent admission with acute on chronic systolic CHF.  Patient was admitted in 6/13 with severe dyspnea.  He was diuresed to near-euvolemia.  He was seen by GI and restarted on coumadin.  Coumadin had been stopped in 3/12 in the setting of an upper GI bleed from PUD.    He has been doing quite well since last appointment.  He has gained 10 lbs but has been lifting weights and has been eating more.  Prior to hospitalization, he was nauseated, probably due to hepatic congestion, and had not been eating much at all.  He is no longer nauseated.  No edema.  No exertional dyspnea.  He can walk up steps without a problem.  He feels more energetic.  No orthopnea or PND.  No bleeding problems so far on coumadin.   Labs (6/13): K 4.4, creatinine 1.0, BNP 2793, digoxin 0.6 Labs (7/13): K 3.3, creatinine 0.9, digoxin 0.5  ECG: NSR, LVH with repolarization abnormality, prolonged QT interval.   PMH:  1. Type II diabetes with peripheral neuropathy.  2. Hyperlipidemia 3. Atrial fibrillation: Paroxysmal. He was on dofetilide in the past but was not compliant with it.  He was taken off coumadin with upper GI bleed in 3/12 but is now back on coumadin.  4. Upper GI bleed: From antral ulcers, was in 3/12.  5. Gout 6. Dilated cardiomyopathy: Nonischemic.  Medtronic ICD in [id number] (complicated by RA perforation and tamponade).  LHC in 2002 with 40% LAD stenosis. Echo (6/13) with EF 20%, akinetic anteroseptum, moderate MR, severe TR, moderately dilated RV.  7. CAD: LHC 2002 with 40% LAD.   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  Married.  Nonsmoker.    FH: CHF.   ROS: All systems reviewed and negative except as per HPI.   Current Outpatient Prescriptions  Medication Sig Dispense Refill    . amLODipine (NORVASC) 5 MG tablet Take 1 tablet (5 mg total) by mouth daily.  30 tablet  11  . atorvastatin (LIPITOR) 20 MG tablet Take 1 tablet (20 mg total) by mouth daily at 6 PM.  30 tablet  11  . colchicine 0.6 MG tablet Take 0.6 mg by mouth daily as needed. For gout flare ups      . digoxin (LANOXIN) 0.125 MG tablet Take 1 tablet (0.125 mg total) by mouth daily.  30 tablet  6  . furosemide (LASIX) 40 MG tablet Take 1 tablet (40 mg total) by mouth 2 (two) times daily.  60 tablet  6  . insulin aspart (NOVOLOG) 100 UNIT/ML injection Inject 5 Units into the skin 3 (three) times daily before meals.      . insulin glargine (LANTUS) 100 UNIT/ML injection Inject 15 Units into the skin at bedtime. 75 units at bedtime      . lisinopril (PRINIVIL,ZESTRIL) 40 MG tablet Take 1 tablet (40 mg total) by mouth daily.  30 tablet  11  . nitroGLYCERIN (NITROSTAT) 0.4 MG SL tablet Place 0.4 mg under the tongue every 5 (five) minutes as needed. For chest pain.      Marland Kitchen omeprazole (PRILOSEC) 40 MG capsule Take 1 capsule (40 mg total) by mouth daily.  30 capsule  11  . oxyCODONE-acetaminophen (PERCOCET) 5-325 MG per tablet as needed.      Marland Kitchen  pantoprazole (PROTONIX) 40 MG tablet Take 1 tablet by mouth 2 (two) times daily.      . potassium chloride SA (K-DUR,KLOR-CON) 20 MEQ tablet Take 1 tablet (20 mEq total) by mouth daily.  30 tablet  6  . spironolactone (ALDACTONE) 25 MG tablet Take 1 tablet (25 mg total) by mouth daily.  30 tablet  6  . warfarin (COUMADIN) 5 MG tablet Take 1 tablet (5 mg total) by mouth daily. Or as directed.  60 tablet  11  . metoprolol (TOPROL XL) 200 MG 24 hr tablet Take 1 tablet (200 mg total) by mouth daily.  30 tablet  6    BP 117/81  Pulse 80  Ht 6\' 3"  (1.905 m)  Wt 250 lb (113.399 kg)  BMI 31.25 kg/m2 General: NAD Neck: Thick, no JVD, no thyromegaly or thyroid nodule.  Lungs: Clear to auscultation bilaterally with normal respiratory effort. CV: Nondisplaced PMI.  Heart regular  S1/S2, no S3/S4, 2/6 murmur LLSB.  Trace ankle edema.  No carotid bruit.  Normal pedal pulses.  Abdomen: Soft, nontender, no hepatosplenomegaly, no distention.  Neurologic: Alert and oriented x 3.  Psych: Normal affect. Extremities: No clubbing or cyanosis.

## 2011-09-06 ENCOUNTER — Telehealth: Payer: Self-pay | Admitting: Cardiology

## 2011-09-06 MED ORDER — MAGNESIUM OXIDE 400 MG PO TABS: 400.0000 mg | ORAL_TABLET | Freq: Every day | ORAL | Status: AC

## 2011-09-06 NOTE — Telephone Encounter (Signed)
Reviewed results of lab with pt who states understanding.  RX for Mag Ox will be sent into the pharmacy

## 2011-09-06 NOTE — Telephone Encounter (Signed)
Pt rtn call re results

## 2011-09-07 ENCOUNTER — Other Ambulatory Visit: Payer: Self-pay | Admitting: Internal Medicine

## 2011-09-09 ENCOUNTER — Ambulatory Visit (INDEPENDENT_AMBULATORY_CARE_PROVIDER_SITE_OTHER): Payer: Medicaid Other | Admitting: *Deleted

## 2011-09-09 DIAGNOSIS — Z7901 Long term (current) use of anticoagulants: Secondary | ICD-10-CM

## 2011-09-09 DIAGNOSIS — I4891 Unspecified atrial fibrillation: Secondary | ICD-10-CM

## 2011-09-10 ENCOUNTER — Encounter: Payer: Self-pay | Admitting: Internal Medicine

## 2011-10-11 ENCOUNTER — Ambulatory Visit (INDEPENDENT_AMBULATORY_CARE_PROVIDER_SITE_OTHER): Payer: Medicaid Other | Admitting: Endocrinology

## 2011-10-11 ENCOUNTER — Encounter: Payer: Self-pay | Admitting: Endocrinology

## 2011-10-11 VITALS — BP 130/98 | HR 109 | Temp 97.5°F | Ht 75.0 in | Wt 258.0 lb

## 2011-10-11 DIAGNOSIS — E119 Type 2 diabetes mellitus without complications: Secondary | ICD-10-CM

## 2011-10-11 MED ORDER — INSULIN ASPART 100 UNIT/ML ~~LOC~~ SOLN: 25.0000 [IU] | Freq: Three times a day (TID) | SUBCUTANEOUS | Status: DC

## 2011-10-11 MED ORDER — ACCU-CHEK NANO SMARTVIEW W/DEVICE KIT: 1.0000 | PACK | Freq: Once | Status: DC

## 2011-10-11 MED ORDER — GLUCOSE BLOOD VI STRP: 1.0000 | ORAL_STRIP | Freq: Two times a day (BID) | Status: DC

## 2011-10-11 NOTE — Progress Notes (Signed)
Subjective:    Patient ID: Corey Palmer, male    DOB: [birth year], 41 y.o.   MRN: [id number]  HPI pt states 3 years h/o dm.  it is complicated by CAD and peripheral sensory neuropathy.  he has been on insulin since soon after dx.  pt says his diet is "ok," and and exercise is limited by health probs. He reports 4 mos of moderate pain of the feet, and assoc numbness.   He takes lantus, 80 units qhs, and prn novolog (averages 60 units total per day).  Pt says despite recent increases of his insulin, cbg's continue to be in the 200's, but he doesn't know details of his cbg's.  denies hypoglycemia.   Past Medical History  Diagnosis Date  . Dilated cardiomyopathy     status post ICD placement in 2008 c/b tearing at the atrial junction resulting in tamponade and urgent thoracotomy  . Gout, unspecified   . Dyslipidemia   . Hypertension   . Diabetes uncomplicated adult-type II     on insulin therapy, a1c 12.9 in 01/2011  . Chronic systolic heart failure     2D echo (123456) - Systolic function severely reduced., LV EF 20-25%. Akinesis of apical and anteroseptal mycoardium.  last 2D echo (11/2008 ), LV EF 25-30%, diffuse hypokinesis, and grade 1 diastolic dysfunction.  Marland Kitchen CAD (coronary artery disease)     Cardiac cath (08/19/2000) - Nonobstructive, 40% stenosis of LAD,.   . Paroxysmal atrial fibrillation     on chronic coumadin, goal INR 2-3. status post direct current  . Obesity (BMI 30.0-34.9)   . Hepatic steatosis 2011    seen on ultrasound.   . Anginal pain   . Myocardial infarction 2008  . ICD (implantable cardiac defibrillator) in place 2007  . Shortness of breath   . Pacemaker   . CHF (congestive heart failure)   . Cancer 2013    benign stomach cancer  . Peptic ulcer     Past Surgical History  Procedure Date  . Icd placement   . Cardiac pacemaker placement   . Left total knee replacement   . Cholecystectomy   . Esophagogastroduodenoscopy 04/05/2011    Procedure:  ESOPHAGOGASTRODUODENOSCOPY (EGD);  Surgeon: Irene Shipper, MD;  Location: [facility] ENDOSCOPY;  Service: Endoscopy;  Laterality: N/A;  . Joint replacement     left knee replacement  . Cardiac catheterization     History   Social History  . Marital Status: Married    Spouse Name: N/A    Number of Children: 3  . Years of Education: college   Occupational History  . bar tender    Social History Main Topics  . Smoking status: Never Smoker   . Smokeless tobacco: Never Used  . Alcohol Use: No  . Drug Use: No  . Sexually Active: Not on file   Other Topics Concern  . Not on file   Social History Narrative   Lives in [place].Studied sports medicine in college, got a BS - used to work with the [employer].    Current Outpatient Prescriptions on File Prior to Visit  Medication Sig Dispense Refill  . ACCU-CHEK SMARTVIEW test strip USE AS DIRECTED TO CHECK BLOOD SUGAR UPTO 4 TIMES A DAY  100 strip  3  . amLODipine (NORVASC) 5 MG tablet Take 1 tablet (5 mg total) by mouth daily.  30 tablet  11  . atorvastatin (LIPITOR) 20 MG tablet Take 1 tablet (20 mg total) by mouth daily at 6 PM.  30 tablet  11  . colchicine 0.6 MG tablet Take 0.6 mg by mouth daily as needed. For gout flare ups      . digoxin (LANOXIN) 0.125 MG tablet Take 1 tablet (0.125 mg total) by mouth daily.  30 tablet  6  . furosemide (LASIX) 40 MG tablet Take 1 tablet (40 mg total) by mouth 2 (two) times daily.  60 tablet  6  . insulin aspart (NOVOLOG) 100 UNIT/ML injection Inject 5 Units into the skin 3 (three) times daily before meals.      . insulin glargine (LANTUS) 100 UNIT/ML injection Inject 15 Units into the skin at bedtime. 75 units at bedtime      . lisinopril (PRINIVIL,ZESTRIL) 40 MG tablet Take 1 tablet (40 mg total) by mouth daily.  30 tablet  11  . magnesium oxide (MAG-OX 400) 400 MG tablet Take 1 tablet (400 mg total) by mouth daily.  30 tablet  11  . metoprolol (TOPROL XL) 200 MG 24 hr tablet Take 1 tablet (200 mg  total) by mouth daily.  30 tablet  6  . nitroGLYCERIN (NITROSTAT) 0.4 MG SL tablet Place 0.4 mg under the tongue every 5 (five) minutes as needed. For chest pain.      Marland Kitchen omeprazole (PRILOSEC) 40 MG capsule Take 1 capsule (40 mg total) by mouth daily.  30 capsule  11  . oxyCODONE-acetaminophen (PERCOCET) 5-325 MG per tablet as needed.      . pantoprazole (PROTONIX) 40 MG tablet Take 1 tablet by mouth 2 (two) times daily.      . potassium chloride SA (K-DUR,KLOR-CON) 20 MEQ tablet Take 1 tablet (20 mEq total) by mouth daily.  30 tablet  6  . spironolactone (ALDACTONE) 25 MG tablet Take 1 tablet (25 mg total) by mouth daily.  30 tablet  6  . warfarin (COUMADIN) 5 MG tablet Take 1 tablet (5 mg total) by mouth daily. Or as directed.  60 tablet  11    Allergies  Allergen Reactions  . Orange Fruit Anaphylaxis  . Penicillins Anaphylaxis    Family History  Problem Relation Age of Onset  . Diabetes Mother   . Coronary artery disease Mother 64    Died of MI at 63  . Gout Mother   . Heart disease Mother   . Heart attack Mother   . Diabetes Father   . Coronary artery disease Father 11    Died in a house fire   . Heart disease Father   . Heart disease Brother     BP 130/98  Pulse 109  Temp 97.5 F (36.4 C) (Oral)  Ht 6\' 3"  (1.905 m)  Wt 258 lb (117.028 kg)  BMI 32.25 kg/m2  SpO2 96%   Review of Systems denies weight loss, blurry vision, headache, chest pain, sob, n/v, urinary frequency, excessive diaphoresis, memory loss, depression, ED sxs, and rhinorrhea.  He reports leg cramps and easy bruising.    Objective:   Physical Exam VS: see vs page GEN: no distress HEAD: head: no deformity eyes: no periorbital swelling, no proptosis external nose and ears are normal mouth: no lesion seen NECK: supple, thyroid is not enlarged CHEST WALL: no deformity LUNGS: clear to auscultation BREASTS:  No gynecomastia CV: reg rate and rhythm, no murmur ABD: abdomen is soft, nontender.  no  hepatosplenomegaly.  not distended.  no hernia MUSCULOSKELETAL: muscle bulk and strength are grossly normal.  no obvious joint swelling.  gait is normal and steady EXTEMITIES: no deformity.  no ulcer on the feet.  feet are of normal color and temp.  1+ bilat edema, and rust-colored hyperpigmentation. PULSES: dorsalis pedis intact bilat.  no carotid bruit NEURO:  cn 2-12 grossly intact.   readily moves all 4's.  sensation is intact to touch on the feet, but decreased from normal SKIN:  Normal texture and temperature.  No rash or suspicious lesion is visible.   NODES:  None palpable at the neck.   PSYCH: alert, oriented x3.  Does not appear anxious nor depressed.   outside test results are reviewed: A1c=11%    Assessment & Plan:  DM, needs increased rx CAD: in view of this, he should avoid hypoglycemia Foot pain.  This and other probs limits exercise rx of DM

## 2011-10-11 NOTE — Patient Instructions (Addendum)
good diet and exercise habits significanly improve the control of your diabetes.  please let me know if you wish to be referred to a dietician.  high blood sugar is very risky to your health.  you should see an eye doctor every year.  You are at higher than average risk for pneumonia and hepatitis-B.  You should be vaccinated against both.   controlling your blood pressure and cholesterol drastically reduces the damage diabetes does to your body.  this also applies to quitting smoking.  please discuss these with your doctor.  you should take an aspirin every day, unless you have been advised by a doctor not to.  check your blood sugar 2 times a day.  vary the time of day when you check, between before the 3 meals, and at bedtime.  also check if you have symptoms of your blood sugar being too high or too low.  please keep a record of the readings and bring it to your next appointment here.  please call us sooner if your blood sugar goes below 70, or if you have a lot of readings over 200.  For now:  Continue the same lantus, and:  Take novolog 25 units 3 times a day (just before each meal), no matter what your blood sugar is.   Please come back for a follow-up appointment in 2 weeks.

## 2011-10-14 ENCOUNTER — Emergency Department (HOSPITAL_COMMUNITY): Payer: Medicaid Other

## 2011-10-14 ENCOUNTER — Encounter (HOSPITAL_COMMUNITY): Payer: Self-pay | Admitting: *Deleted

## 2011-10-14 ENCOUNTER — Observation Stay (HOSPITAL_COMMUNITY)
Admission: EM | Admit: 2011-10-14 | Discharge: 2011-10-15 | Disposition: A | Discharge status: Home / Self Care | Payer: Medicaid Other | Attending: Internal Medicine | Admitting: Internal Medicine

## 2011-10-14 DIAGNOSIS — I255 Ischemic cardiomyopathy: Secondary | ICD-10-CM | POA: Diagnosis present

## 2011-10-14 DIAGNOSIS — Z95810 Presence of automatic (implantable) cardiac defibrillator: Secondary | ICD-10-CM

## 2011-10-14 DIAGNOSIS — E119 Type 2 diabetes mellitus without complications: Secondary | ICD-10-CM

## 2011-10-14 DIAGNOSIS — I4891 Unspecified atrial fibrillation: Secondary | ICD-10-CM

## 2011-10-14 DIAGNOSIS — I509 Heart failure, unspecified: Secondary | ICD-10-CM | POA: Insufficient documentation

## 2011-10-14 DIAGNOSIS — I517 Cardiomegaly: Secondary | ICD-10-CM | POA: Insufficient documentation

## 2011-10-14 DIAGNOSIS — E785 Hyperlipidemia, unspecified: Secondary | ICD-10-CM | POA: Insufficient documentation

## 2011-10-14 DIAGNOSIS — E669 Obesity, unspecified: Secondary | ICD-10-CM | POA: Insufficient documentation

## 2011-10-14 DIAGNOSIS — Z7901 Long term (current) use of anticoagulants: Secondary | ICD-10-CM | POA: Insufficient documentation

## 2011-10-14 DIAGNOSIS — R079 Chest pain, unspecified: Secondary | ICD-10-CM | POA: Diagnosis present

## 2011-10-14 DIAGNOSIS — I428 Other cardiomyopathies: Secondary | ICD-10-CM

## 2011-10-14 DIAGNOSIS — I5022 Chronic systolic (congestive) heart failure: Secondary | ICD-10-CM

## 2011-10-14 DIAGNOSIS — Z95811 Presence of heart assist device: Secondary | ICD-10-CM | POA: Diagnosis present

## 2011-10-14 DIAGNOSIS — Z9689 Presence of other specified functional implants: Secondary | ICD-10-CM | POA: Diagnosis present

## 2011-10-14 DIAGNOSIS — R0789 Other chest pain: Principal | ICD-10-CM | POA: Insufficient documentation

## 2011-10-14 DIAGNOSIS — K279 Peptic ulcer, site unspecified, unspecified as acute or chronic, without hemorrhage or perforation: Secondary | ICD-10-CM | POA: Insufficient documentation

## 2011-10-14 LAB — URINALYSIS, ROUTINE W REFLEX MICROSCOPIC
Bilirubin Urine: NEGATIVE
Ketones, ur: NEGATIVE mg/dL
Nitrite: NEGATIVE
pH: 6 (ref 5.0–8.0)

## 2011-10-14 LAB — BASIC METABOLIC PANEL
Chloride: 107 mEq/L (ref 96–112)
Creatinine, Ser: 0.85 mg/dL (ref 0.50–1.35)
GFR calc Af Amer: >90 mL/min (ref >90)
Potassium: 4 mEq/L (ref 3.5–5.1)

## 2011-10-14 LAB — APTT: aPTT: 26 seconds (ref 24–37)

## 2011-10-14 LAB — CBC
Platelets: 223 10*3/uL (ref 150–400)
RDW: 15.2 % (ref 11.5–15.5)
WBC: 8.4 10*3/uL (ref 4.0–10.5)

## 2011-10-14 LAB — TROPONIN I: Troponin I: <0.3 ng/mL (ref <0.30)

## 2011-10-14 LAB — URINE MICROSCOPIC-ADD ON

## 2011-10-14 LAB — PROTIME-INR: Prothrombin Time: 13.8 seconds (ref 11.6–15.2)

## 2011-10-14 LAB — POCT I-STAT TROPONIN I
Troponin i, poc: 0 ng/mL (ref 0.00–0.08)
Troponin i, poc: 0.01 ng/mL (ref 0.00–0.08)

## 2011-10-14 LAB — RAPID URINE DRUG SCREEN, HOSP PERFORMED
Barbiturates: NOT DETECTED
Benzodiazepines: NOT DETECTED
Tetrahydrocannabinol: NOT DETECTED

## 2011-10-14 LAB — LIPASE, BLOOD: Lipase: 27 U/L (ref 11–59)

## 2011-10-14 MED ORDER — NITROGLYCERIN 2 % TD OINT
1.0000 [in_us] | TOPICAL_OINTMENT | Freq: Once | TRANSDERMAL | Status: AC
  Administered 2011-10-14: 1 [in_us] via TOPICAL
  Filled 2011-10-14: qty 1

## 2011-10-14 MED ORDER — SODIUM CHLORIDE 0.9 % IV SOLN
1000.0000 mL | INTRAVENOUS | Status: DC
  Administered 2011-10-14: 1000 mL via INTRAVENOUS

## 2011-10-14 MED ORDER — NITROGLYCERIN 0.4 MG SL SUBL: 0.4000 mg | SUBLINGUAL_TABLET | SUBLINGUAL | Status: DC | PRN

## 2011-10-14 MED ORDER — ASPIRIN 81 MG PO CHEW: 324.0000 mg | CHEWABLE_TABLET | Freq: Once | ORAL | Status: DC

## 2011-10-14 MED ORDER — IOHEXOL 350 MG/ML SOLN
100.0000 mL | Freq: Once | INTRAVENOUS | Status: AC | PRN
  Administered 2011-10-14: 100 mL via INTRAVENOUS

## 2011-10-14 MED ORDER — MECLIZINE HCL 25 MG PO TABS: 50.0000 mg | ORAL_TABLET | Freq: Once | ORAL | Status: DC

## 2011-10-14 NOTE — ED Notes (Signed)
CT notified pt has appropriate IV for scan now.

## 2011-10-14 NOTE — ED Notes (Signed)
Pt reports sudden onset of mid-sternum chest pain radiating under (R) breast and into (R) arm, SOB, nauseous, abd pain, diaphoretic, dizziness, and low back pain. Pt denies cough/congestion, fever. Pt presents to ED via [facility] in [employer] custody. 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

## 2011-10-14 NOTE — ED Provider Notes (Addendum)
History     CSN: [id number]  Arrival date & time 10/14/11  Vernelle Emerald   First MD Initiated Contact with Patient 10/14/11 1825      Chief Complaint  Patient presents with  . Chest Pain  . Irregular Heart Beat    (Consider location/radiation/quality/duration/timing/severity/associated sxs/prior treatment) HPI The patient is in the custody of [employer].  After that event he began complaining of pain in his chest. Patient states he has had pain in the midsternum radiating under his right breast and right arm. The pain is sharp and severe. He also began feeling short of breath developing nausea and abdominal pain. He also states he started having dizziness and low back pain. The patient denies any fevers cough or congestion. Patient was given aspirin and nitroglycerin by EMS. He has not noticed much relief. Patient does have history of cardiac ablation procedure and has a pacemaker. Past Medical History  Diagnosis Date  . Dilated cardiomyopathy     status post ICD placement in 2008 c/b tearing at the atrial junction resulting in tamponade and urgent thoracotomy  . Gout, unspecified   . Dyslipidemia   . Hypertension   . Diabetes uncomplicated adult-type II     on insulin therapy, a1c 12.9 in 01/2011  . Chronic systolic heart failure     2D echo (123456) - Systolic function severely reduced., LV EF 20-25%. Akinesis of apical and anteroseptal mycoardium.  last 2D echo (11/2008 ), LV EF 25-30%, diffuse hypokinesis, and grade 1 diastolic dysfunction.  Marland Kitchen CAD (coronary artery disease)     Cardiac cath (08/19/2000) - Nonobstructive, 40% stenosis of LAD,.   . Paroxysmal atrial fibrillation     on chronic coumadin, goal INR 2-3. status post direct current  . Obesity (BMI 30.0-34.9)   . Hepatic steatosis 2011    seen on ultrasound.   . Anginal pain   . Myocardial infarction 2008  . ICD (implantable cardiac defibrillator) in place 2007  . Shortness of breath   . Pacemaker   . CHF (congestive heart  failure)   . Cancer 2013    benign stomach cancer  . Peptic ulcer     Past Surgical History  Procedure Date  . Icd placement   . Cardiac pacemaker placement   . Left total knee replacement   . Cholecystectomy   . Esophagogastroduodenoscopy 04/05/2011    Procedure: ESOPHAGOGASTRODUODENOSCOPY (EGD);  Surgeon: Irene Shipper, MD;  Location[facility] END[facility]COPY;  Service: Endoscopy;  Laterality: N/A;  . Joint replacement     left knee replacement  . Cardiac catheterization     Family History  Problem Relation Age of Onset  . Diabetes Mother   . Coronary artery disease Mother 24    Died of MI at 68  . Gout Mother   . Heart disease Mother   . Heart attack Mother   . Diabetes Father   . Coronary artery disease Father 65    Died in a house fire   . Heart disease Father   . Heart disease Brother     History  Substance Use Topics  . Smoking status: Never Smoker   . Smokeless tobacco: Never Used  . Alcohol Use: No      Review of Systems  All other systems reviewed and are negative.    Allergies  Orange fruit and Penicillins  Home Medications   Current Outpatient Rx  Name Route Sig Dispense Refill  . AMLODIPINE BESYLATE 5 MG PO TABS Oral Take 1 tablet (5 mg  total) by mouth daily. 30 tablet 11  . ATORVASTATIN CALCIUM 20 MG PO TABS Oral Take 1 tablet (20 mg total) by mouth daily at 6 PM. 30 tablet 11  . COLCHICINE 0.6 MG PO TABS Oral Take 0.6 mg by mouth daily as needed. For gout flare ups    . DIGOXIN 0.125 MG PO TABS Oral Take 1 tablet (0.125 mg total) by mouth daily. 30 tablet 6  . FUROSEMIDE 40 MG PO TABS Oral Take 1 tablet (40 mg total) by mouth 2 (two) times daily. 60 tablet 6  . INSULIN ASPART 100 UNIT/ML [place] SOLN Subcutaneous Inject 25 Units into the skin 3 (three) times daily before meals. And pen needles 4/day 30 mL 12  . INSULIN GLARGINE 100 UNIT/ML [place] SOLN Subcutaneous Inject 85 Units into the skin at bedtime.     Marland Kitchen LISINOPRIL 40 MG PO TABS Oral Take 1 tablet (40 mg  total) by mouth daily. 30 tablet 11  . MAGNESIUM OXIDE 400 MG PO TABS Oral Take 1 tablet (400 mg total) by mouth daily. 30 tablet 11  . METOPROLOL SUCCINATE ER 200 MG PO TB24 Oral Take 1 tablet (200 mg total) by mouth daily. 30 tablet 6  . NITROGLYCERIN 0.4 MG SL SUBL Sublingual Place 0.4 mg under the tongue every 5 (five) minutes as needed. For chest pain.    Marland Kitchen OMEPRAZOLE 40 MG PO CPDR Oral Take 1 capsule (40 mg total) by mouth daily. 30 capsule 11  . OXYCODONE-ACETAMINOPHEN 5-325 MG PO TABS Oral Take 1 tablet by mouth daily as needed. For pain    . PANTOPRAZOLE SODIUM 40 MG PO TBEC Oral Take 1 tablet by mouth 2 (two) times daily.    Marland Kitchen POTASSIUM CHLORIDE CRYS ER 20 MEQ PO TBCR Oral Take 1 tablet (20 mEq total) by mouth daily. 30 tablet 6  . SPIRONOLACTONE 25 MG PO TABS Oral Take 1 tablet (25 mg total) by mouth daily. 30 tablet 6  . WARFARIN SODIUM 5 MG PO TABS Oral Take 5 mg by mouth daily. Tuesday and sundays      BP 143/90  Pulse 116  Temp 98.8 F (37.1 C) (Oral)  Resp 22  SpO2 98%  Physical Exam  Nursing note and vitals reviewed. Constitutional: He appears well-developed and well-nourished. No distress.  HENT:  Head: Normocephalic and atraumatic.  Right Ear: External ear normal.  Left Ear: External ear normal.  Eyes: Conjunctivae normal are normal. Right eye exhibits no discharge. Left eye exhibits no discharge. No scleral icterus.  Neck: Neck supple. No tracheal deviation present.  Cardiovascular: Regular rhythm and intact distal pulses.  Tachycardia present.   Pulmonary/Chest: Effort normal and breath sounds normal. No stridor. No respiratory distress. He has no wheezes. He has no rales.  Abdominal: Soft. Bowel sounds are normal. He exhibits no distension. There is no tenderness. There is no rebound and no guarding.  Musculoskeletal: He exhibits no edema and no tenderness.  Neurological: He is alert. He has normal strength. No sensory deficit. Cranial nerve deficit:  no gross  defecits noted. He exhibits normal muscle tone. He displays no seizure activity. Coordination normal.  Skin: Skin is warm and dry. No rash noted.  Psychiatric: He has a normal mood and affect.    ED Course  Procedures (including critical care time)  Rate: 116  Rhythm: Sinus tachycardia  QRS Axis: normal  Intervals: normal  ST/T Wave abnormalities: T-wave inversion, LVH with repolarization abnormality  Conduction Disutrbances:none  Narrative Interpretation: Left atrial abnormality  Old EKG Reviewed: No significant changes compared to EKG dated 07/17/2011  Labs Reviewed  BASIC METABOLIC PANEL - Abnormal; Notable for the following:    Glucose, Bld 215 (*)     All other components within normal limits  URINALYSIS, ROUTINE W REFLEX MICROSCOPIC - Abnormal; Notable for the following:    Glucose, UA 250 (*)     Hgb urine dipstick LARGE (*)     Protein, ur >300 (*)     All other components within normal limits  URINE MICROSCOPIC-ADD ON - Abnormal; Notable for the following:    Casts HYALINE CASTS (*)     All other components within normal limits  TROPONIN I  CBC  PROTIME-INR  APTT  LIPASE, BLOOD  URINE RAPID DRUG SCREEN (HOSP PERFORMED)  POCT I-STAT TROPONIN I  POCT I-STAT TROPONIN I   Ct Angio Chest Pe W/cm &/or Wo Cm  10/14/2011  *RADIOLOGY REPORT*  Clinical Data: Tachycardia with chest pain.  History of pacemaker placement.  CT ANGIOGRAPHY CHEST  Technique:  Multidetector CT imaging of the chest using the standard protocol during bolus administration of intravenous contrast. Multiplanar reconstructed images including MIPs were obtained and reviewed to evaluate the vascular anatomy.  Contrast: 170mL OMNIPAQUE IOHEXOL 350 MG/ML SOLN  Comparison: Chest x-ray earlier in the day.  CT chest 07/17/2011.  Findings: Status post pacemaker. Atrial and ventricular leads in good position.  Status post median sternotomy for CABG. Cardiomegaly.  Coronary calcification.  No pericardial effusion. No  pleural effusion.  Mild vascular congestion. No pneumothorax or consolidation. No pathologic hilar or mediastinal lymph node enlargement.  No axillary adenopathy.  Good opacification of the central and peripheral pulmonary vasculature.  No evidence for filling defect to suggest acute PE. Moderately dilated left atrium.  No intracardiac thrombus seen.  No acute osseous findings.  Upper abdominal structures unremarkable.  Prior cholecystectomy.  IMPRESSION: No evidence for PE.  No acute cardiopulmonary findings. Mild vascular congestion.  Similar appearance to 07/17/2011.   Original Report Authenticated By: Staci Righter, M.D.    Dg Chest Port 1 View  10/14/2011  *RADIOLOGY REPORT*  Clinical Data: Chest pain  PORTABLE CHEST - 1 VIEW  Comparison: 07/16/2011  Findings: Cardiac enlargement.  Median sternotomy and AICD are unchanged.  Negative for heart failure or edema.  No infiltrate or effusion.  IMPRESSION: Cardiac enlargement without acute abnormality.   Original Report Authenticated By: Truett Perna, M.D.       MDM  Pt has no evidence of PE however he does continue to remain tachycardic with complaints of chest pain.  His history is certainly curious in that it started as he was being arrested however he has a complex history with known comorbidities.  I do not feel he is stable at this time to be released to police custody.  I will consult cardiology for their evaluation.    12:11 AM Pt seen by cardiology service.  Recommends medicine admission for serial markers.  Also would have pacemaker interrogated.  Kathalene Frames, MD 10/15/11 [phone]

## 2011-10-14 NOTE — ED Notes (Signed)
Patient transported to CT 

## 2011-10-14 NOTE — ED Notes (Addendum)
Pt is here in [employer] custody--unknown why.  Pt was in an interview and became tachycardic, sob, and chest pain that radiates down right side.  Pt has a history of afib.  HR in the 140s.  Pt received nitro x2 and ASA 324 mg en route. Pt denies any relief w/nitro

## 2011-10-15 ENCOUNTER — Encounter (HOSPITAL_COMMUNITY): Payer: Self-pay | Admitting: Internal Medicine

## 2011-10-15 DIAGNOSIS — Z95810 Presence of automatic (implantable) cardiac defibrillator: Secondary | ICD-10-CM

## 2011-10-15 DIAGNOSIS — R079 Chest pain, unspecified: Secondary | ICD-10-CM

## 2011-10-15 LAB — POCT I-STAT TROPONIN I: Troponin i, poc: 0.01 ng/mL (ref 0.00–0.08)

## 2011-10-15 LAB — COMPREHENSIVE METABOLIC PANEL
ALT: 25 U/L (ref 0–53)
AST: 16 U/L (ref 0–37)
Alkaline Phosphatase: 66 U/L (ref 39–117)
CO2: 27 mEq/L (ref 19–32)
GFR calc Af Amer: >90 mL/min (ref >90)
Glucose, Bld: 301 mg/dL — ABNORMAL HIGH (ref 70–99)
Potassium: 3.7 mEq/L (ref 3.5–5.1)
Sodium: 139 mEq/L (ref 135–145)
Total Protein: 5.7 g/dL — ABNORMAL LOW (ref 6.0–8.3)

## 2011-10-15 LAB — CBC WITH DIFFERENTIAL/PLATELET
Basophils Absolute: 0 10*3/uL (ref 0.0–0.1)
Basophils Relative: 0 % (ref 0–1)
Eosinophils Absolute: 0.1 10*3/uL (ref 0.0–0.7)
Eosinophils Relative: 2 % (ref 0–5)
HCT: 37.9 % — ABNORMAL LOW (ref 39.0–52.0)
MCH: 28.2 pg (ref 26.0–34.0)
MCHC: 35.1 g/dL (ref 30.0–36.0)
Monocytes Absolute: 0.7 10*3/uL (ref 0.1–1.0)
Neutro Abs: 4.9 10*3/uL (ref 1.7–7.7)
RDW: 15.3 % (ref 11.5–15.5)

## 2011-10-15 LAB — GLUCOSE, CAPILLARY
Glucose-Capillary: 277 mg/dL — ABNORMAL HIGH (ref 70–99)
Glucose-Capillary: 231 mg/dL — ABNORMAL HIGH (ref 70–99)
Glucose-Capillary: 115 mg/dL — ABNORMAL HIGH (ref 70–99)

## 2011-10-15 MED ORDER — ACETAMINOPHEN 650 MG RE SUPP: 650.0000 mg | Freq: Four times a day (QID) | RECTAL | Status: DC | PRN

## 2011-10-15 MED ORDER — INSULIN GLARGINE 100 UNIT/ML ~~LOC~~ SOLN: 85.0000 [IU] | Freq: Every day | SUBCUTANEOUS | Status: DC

## 2011-10-15 MED ORDER — PANTOPRAZOLE SODIUM 40 MG PO TBEC: 40.0000 mg | DELAYED_RELEASE_TABLET | Freq: Every day | ORAL | Status: DC

## 2011-10-15 MED ORDER — INSULIN ASPART 100 UNIT/ML ~~LOC~~ SOLN
25.0000 [IU] | Freq: Three times a day (TID) | SUBCUTANEOUS | Status: DC
  Administered 2011-10-15 (×2): 25 [IU] via SUBCUTANEOUS

## 2011-10-15 MED ORDER — SODIUM CHLORIDE 0.9 % IJ SOLN
3.0000 mL | Freq: Two times a day (BID) | INTRAMUSCULAR | Status: DC
  Administered 2011-10-15 (×2): 3 mL via INTRAVENOUS

## 2011-10-15 MED ORDER — SODIUM CHLORIDE 0.9 % IJ SOLN
3.0000 mL | Freq: Two times a day (BID) | INTRAMUSCULAR | Status: DC
  Administered 2011-10-15: 3 mL via INTRAVENOUS

## 2011-10-15 MED ORDER — ONDANSETRON HCL 4 MG/2ML IJ SOLN: 4.0000 mg | Freq: Four times a day (QID) | INTRAMUSCULAR | Status: DC | PRN

## 2011-10-15 MED ORDER — ATORVASTATIN CALCIUM 20 MG PO TABS
20.0000 mg | ORAL_TABLET | Freq: Every day | ORAL | Status: DC
Start: 2011-10-15 — End: 2011-10-15
  Filled 2011-10-15: qty 1

## 2011-10-15 MED ORDER — FUROSEMIDE 40 MG PO TABS
40.0000 mg | ORAL_TABLET | Freq: Two times a day (BID) | ORAL | Status: DC
  Administered 2011-10-15: 40 mg via ORAL
  Filled 2011-10-15 (×3): qty 1

## 2011-10-15 MED ORDER — SPIRONOLACTONE 25 MG PO TABS
25.0000 mg | ORAL_TABLET | Freq: Every day | ORAL | Status: DC
  Administered 2011-10-15: 25 mg via ORAL
  Filled 2011-10-15: qty 1

## 2011-10-15 MED ORDER — WARFARIN SODIUM 7.5 MG PO TABS
15.0000 mg | ORAL_TABLET | Freq: Once | ORAL | Status: DC
  Filled 2011-10-15: qty 2

## 2011-10-15 MED ORDER — ACETAMINOPHEN 325 MG PO TABS: 650.0000 mg | ORAL_TABLET | Freq: Four times a day (QID) | ORAL | Status: DC | PRN

## 2011-10-15 MED ORDER — MAGNESIUM OXIDE 400 (241.3 MG) MG PO TABS
400.0000 mg | ORAL_TABLET | Freq: Every day | ORAL | Status: DC
  Administered 2011-10-15: 400 mg via ORAL
  Filled 2011-10-15: qty 1

## 2011-10-15 MED ORDER — METOPROLOL SUCCINATE ER 100 MG PO TB24
200.0000 mg | ORAL_TABLET | Freq: Every day | ORAL | Status: DC
  Administered 2011-10-15: 200 mg via ORAL
  Filled 2011-10-15: qty 2

## 2011-10-15 MED ORDER — DIGOXIN 125 MCG PO TABS
0.1250 mg | ORAL_TABLET | Freq: Every day | ORAL | Status: DC
  Administered 2011-10-15: 0.125 mg via ORAL
  Filled 2011-10-15: qty 1

## 2011-10-15 MED ORDER — COLCHICINE 0.6 MG PO TABS: 0.6000 mg | ORAL_TABLET | Freq: Every day | ORAL | Status: DC | PRN

## 2011-10-15 MED ORDER — ONDANSETRON HCL 4 MG PO TABS: 4.0000 mg | ORAL_TABLET | Freq: Four times a day (QID) | ORAL | Status: DC | PRN

## 2011-10-15 MED ORDER — AMLODIPINE BESYLATE 5 MG PO TABS
5.0000 mg | ORAL_TABLET | Freq: Every day | ORAL | Status: DC
  Administered 2011-10-15: 5 mg via ORAL
  Filled 2011-10-15: qty 1

## 2011-10-15 MED ORDER — WARFARIN - PHARMACIST DOSING INPATIENT: Freq: Every day | Status: DC

## 2011-10-15 MED ORDER — INSULIN ASPART 100 UNIT/ML ~~LOC~~ SOLN
0.0000 [IU] | Freq: Three times a day (TID) | SUBCUTANEOUS | Status: DC
  Administered 2011-10-15: 0 [IU] via SUBCUTANEOUS
  Administered 2011-10-15: 3 [IU] via SUBCUTANEOUS

## 2011-10-15 MED ORDER — OXYCODONE-ACETAMINOPHEN 5-325 MG PO TABS: 1.0000 | ORAL_TABLET | Freq: Every day | ORAL | Status: DC | PRN

## 2011-10-15 MED ORDER — LISINOPRIL 40 MG PO TABS
40.0000 mg | ORAL_TABLET | Freq: Every day | ORAL | Status: DC
  Administered 2011-10-15: 40 mg via ORAL
  Filled 2011-10-15: qty 1

## 2011-10-15 MED ORDER — POTASSIUM CHLORIDE CRYS ER 20 MEQ PO TBCR
20.0000 meq | EXTENDED_RELEASE_TABLET | Freq: Every day | ORAL | Status: DC
  Administered 2011-10-15: 20 meq via ORAL
  Filled 2011-10-15: qty 1

## 2011-10-15 MED ORDER — PANTOPRAZOLE SODIUM 40 MG PO TBEC
40.0000 mg | DELAYED_RELEASE_TABLET | Freq: Two times a day (BID) | ORAL | Status: DC
  Administered 2011-10-15 (×2): 40 mg via ORAL
  Filled 2011-10-15 (×2): qty 1

## 2011-10-15 NOTE — Discharge Summary (Signed)
Physician Discharge Summary  Patient ID: Corey Palmer MRN: [id number] DOB/AGE: 1970/10/18 41 y.o.  Admit date: 10/14/2011 Discharge date: 10/15/2011  Primary Care Physician:  Jenna Luo TOM, MD   Discharge Diagnoses:    Principal Problem:  *Chest pain- atypical, likely musculoskeletal Active Problems:  DYSLIPIDEMIA  CARDIOMYOPATHY dilated  P. Atrial fibrillation on coumadin  Nonobstructive CAD  Chronic Systolic CHF EF of 123456 s/p [employer] ICD   PUD/Upper GI bleed 3/13  DM2  Obesity      Medication List     As of 10/15/2011 11:51 AM    TAKE these medications         amLODipine 5 MG tablet   Commonly known as: NORVASC   Take 1 tablet (5 mg total) by mouth daily.      atorvastatin 20 MG tablet   Commonly known as: LIPITOR   Take 1 tablet (20 mg total) by mouth daily at 6 PM.      colchicine 0.6 MG tablet   Take 0.6 mg by mouth daily as needed. For gout flare ups      digoxin 0.125 MG tablet   Commonly known as: LANOXIN   Take 1 tablet (0.125 mg total) by mouth daily.      furosemide 40 MG tablet   Commonly known as: LASIX   Take 1 tablet (40 mg total) by mouth 2 (two) times daily.      insulin aspart 100 UNIT/ML injection   Commonly known as: novoLOG   Inject 25 Units into the skin 3 (three) times daily before meals. And pen needles 4/day      insulin glargine 100 UNIT/ML injection   Commonly known as: LANTUS   Inject 85 Units into the skin at bedtime.      lisinopril 40 MG tablet   Commonly known as: PRINIVIL,ZESTRIL   Take 1 tablet (40 mg total) by mouth daily.      magnesium oxide 400 MG tablet   Commonly known as: MAG-OX   Take 1 tablet (400 mg total) by mouth daily.      metoprolol 200 MG 24 hr tablet   Commonly known as: TOPROL-XL   Take 1 tablet (200 mg total) by mouth daily.      nitroGLYCERIN 0.4 MG SL tablet   Commonly known as: NITROSTAT   Place 0.4 mg under the tongue every 5 (five) minutes as needed. For chest pain.     omeprazole 40 MG capsule   Commonly known as: PRILOSEC   Take 1 capsule (40 mg total) by mouth daily.      oxyCODONE-acetaminophen 5-325 MG per tablet   Commonly known as: PERCOCET/ROXICET   Take 1 tablet by mouth daily as needed. For pain      pantoprazole 40 MG tablet   Commonly known as: PROTONIX   Take 1 tablet by mouth 2 (two) times daily.      potassium chloride SA 20 MEQ tablet   Commonly known as: K-DUR,KLOR-CON   Take 1 tablet (20 mEq total) by mouth daily.      spironolactone 25 MG tablet   Commonly known as: ALDACTONE   Take 1 tablet (25 mg total) by mouth daily.      warfarin 5 MG tablet   Commonly known as: COUMADIN   Take 5 mg by mouth daily. Tuesday and sundays       Disposition and Follow-up:  Dr.Mclean in 2 weeks  Consults: Dr.Katz, [place] [facility]  Significant Diagnostic Studies:  Ct Angio Chest Pe  W/cm &/or Wo Cm  10/14/2011  *RADIOLOGY REPORT*  Clinical Data: Tachycardia with chest pain.  History of pacemaker placement.  CT ANGIOGRAPHY CHEST  Technique:  Multidetector CT imaging of the chest using the standard protocol during bolus administration of intravenous contrast. Multiplanar reconstructed images including MIPs were obtained and reviewed to evaluate the vascular anatomy.  Contrast: 112mL OMNIPAQUE IOHEXOL 350 MG/ML SOLN  Comparison: Chest x-ray earlier in the day.  CT chest 07/17/2011.  Findings: Status post pacemaker. Atrial and ventricular leads in good position.  Status post median sternotomy for CABG. Cardiomegaly.  Coronary calcification.  No pericardial effusion. No pleural effusion.  Mild vascular congestion. No pneumothorax or consolidation. No pathologic hilar or mediastinal lymph node enlargement.  No axillary adenopathy.  Good opacification of the central and peripheral pulmonary vasculature.  No evidence for filling defect to suggest acute PE. Moderately dilated left atrium.  No intracardiac thrombus seen.  No acute osseous findings.  Upper  abdominal structures unremarkable.  Prior cholecystectomy.  IMPRESSION: No evidence for PE.  No acute cardiopulmonary findings. Mild vascular congestion.  Similar appearance to 07/17/2011.   Original Report Authenticated By: Staci Righter, M.D.    Dg Chest Port 1 View  10/14/2011  *RADIOLOGY REPORT*  Clinical Data: Chest pain  PORTABLE CHEST - 1 VIEW  Comparison: 07/16/2011  Findings: Cardiac enlargement.  Median sternotomy and AICD are unchanged.  Negative for heart failure or edema.  No infiltrate or effusion.  IMPRESSION: Cardiac enlargement without acute abnormality.   Original Report Authenticated By: Truett Perna, M.D.     Brief H and P: 41 year old male with history of nonischemic cardiomyopathy status post AICD placement, atrial fibrillation who was recently restarted on Coumadin after GI bleed in early part of this year secondary to antral ulcers, presents with complaints of chest pain. Patient's chest pain started around 5 PM last evening which was right-sided and middle of the chest. The pain is more of a stabbing nature nonradiating with no services shortness of breath but did have palpitations. In the ER cardiac enzymes were negative. CT angiogram of the chest was negative for PE. Patient will be admitted for further observation. Patient was mildly nauseated initially but denies any abdominal pain vomiting or diarrhea.   Hospital Course:  . Chest pain: Patient presents with palpitations and atypical chest pain. No objective evidence of ischemia.  Cardiac enzymes unremarkable. No ICD shock or syncope. CTA chest neg for PE or other acute cardiopulmonary findings. Symptoms resolved. S/p interrogation of ICD.  Cardiology felt that if no significant findings can be dc'd from a cardiac standpoint.  Educated on the importance of strict medication compliance.   2. Dilated Cardiomyopathy: EF 20%. Euvolemic on exam. Cont meds.   3. Paroxysmal Atrial Fibrillation: In sinus rhythm. Rates now  80-90s. Cont Toprol XL. Subtherapeutic INR. Cont coumadin  Addendum: ICD interrogated by [employer] representative. No events noted. Per the rep, she spoke with Dr. Lovena Le and decreased his capture zone to 140bpm. Please see the printed report in his paper chart.   Per cardiology recommendations he was discharged home and advised to Follow up with Dr.Mclean    Time spent on Discharge: 9min  Signed: Roxsana Riding [facility]  10/15/2011, 11:51 AM

## 2011-10-15 NOTE — Consult Note (Addendum)
CARDIOLOGY CONSULT NOTE  Patient ID: Corey Palmer, MRN: [id number], DOB/AGE: [birth year] 41 y.o. Admit date: 10/14/2011 Date of Consult: 10/15/2011  Primary Physician: Odette Fraction, MD Primary Cardiologist: Dr. McLean/Dr. Lovena Le (EP)  Chief Complaint: palpitations and chest pain Reason for Consultation: chest pain  HPI: 41 y.o. male w/ PMHx significant for Nonobstructive CAD (40% LAD by cath '02), Dilated CM (s/p Medtronic ICD '08), Chronic Systolic CHF (EF 123456 by echo 06/2011), PAF (on coumadin), PUD/UGI bleed (03/2011), HTN, HLD, DMII, and Obesity who presented to [facility] on 10/14/2011 with complaints of palpitations and chest pain.  [place] 2002: No significant CAD, LAD mild 40% stenosis in mid LAD. Echo 06/2011 showed mild LVH, EF 20%, diffuse hypokinesis, akinesis of entire anteroseptal myocardium, mod MR, mod-severe LAE, mod dilated RV/RA, severe TR, PASP 29mmHg. Was last seen by Dr. Aundra Dubin in clinic on 09/04/11 at which time he was noted to be doing well without anginal symptoms, nausea, edema, orthopnea or exertional dyspnea. He had an upper GI bleed from antral ulcers in 03/2011, but has since been restarted on coumadin without signs of bleeding. His Toprol XL was increased to 200mg  daily and labs checked showing pBNP 121 and hypomagnesemia for which he was started on Mag Ox.  Patient reports doing well overall. Denies dyspnea, orthopnea, edema, or weight gain. Yesterday did not take his morning Toprol XL or Coumadin because he was traveling. Around 5pm while sitting at home felt his heart racing, then slowing "like his pacemaker was trying to override" then became dizzy and had sharp chest pain over his ICD site and right chest wall. No ICD shock or syncope. (+) nausea. No sob or diaphoresis. He presented to the ED where his sharp chest pains resolved after ~5hrs. EKG shows sinus tachycardia 116bpm, LVH with repol abnls, unchanged from prior EKG. CXR was without acute cardiopulmonary  findings. CTA chest showed no evidence of PE or other acute findings. Labs are significant for normal troponins, unremarkable CBC/CMET, subtherapeutic INR 1.04, normal Lipase, and negative UDS.    Past Medical History  Diagnosis Date  . Dilated cardiomyopathy     status post ICD placement in 2008 c/b tearing at the atrial junction resulting in tamponade and urgent thoracotomy  . Gout, unspecified   . Dyslipidemia   . Hypertension   . Diabetes uncomplicated adult-type II     on insulin therapy, a1c 12.9 in 01/2011  . Chronic systolic heart failure     2D echo (123456) - Systolic function severely reduced., LV EF 20-25%. Akinesis of apical and anteroseptal mycoardium.  last 2D echo (11/2008 ), LV EF 25-30%, diffuse hypokinesis, and grade 1 diastolic dysfunction.  Marland Kitchen CAD (coronary artery disease)     Cardiac cath (08/19/2000) - Nonobstructive, 40% stenosis of LAD,.   . Paroxysmal atrial fibrillation     on chronic coumadin, goal INR 2-3. status post direct current  . Obesity (BMI 30.0-34.9)   . Hepatic steatosis 2011    seen on ultrasound.   . Anginal pain   . Myocardial infarction 2008  . ICD (implantable cardiac defibrillator) in place 2007  . Shortness of breath   . Pacemaker   . CHF (congestive heart failure)   . Cancer 2013    benign stomach cancer  . Peptic ulcer      07/17/11 - Echo Study Conclusions:  - Left ventricle: The cavity size was severely dilated. Wall thickness was increased in a pattern of mild LVH. The estimated ejection fraction was 20%.  Diffuse hypokinesis. There is akinesis of the entireanteroseptal myocardium.  - Mitral valve: Moderate regurgitation. - Left atrium: The atrium was moderately to severely dilated. - Right ventricle: The cavity size was moderately dilated. - Right atrium: The atrium was moderately dilated. - Tricuspid valve: Severe regurgitation. - Pulmonary arteries: PA peak pressure: 62mm Hg (S).  2002 - Cath This revealed a large left main  with no significant disease.  The LAD is a large vessel which coursed to the apex and gave rise to two diagonal branches. The LAD is noted to have mild 40% stenosis in the early mid portion of the LAD, with mild calcification. The first diagonal is a large vessel with no significant disease. The second diagonal is a medium sized vessel with no significant disease.  The left circumflex is a medium sized vessel which coursed in the AV groove and gave rise to one large bifurcating obtuse marginal. There is no significant disease in the AV groove circumflex or OM.  The right coronary artery is a large vessel which is dominant and gives rise to both a PDA, as well as a posterolateral branch. There is no significant disease in the RCA, PDA, or posterolateral branch.  Left ventriculogram reveals moderately depressed EF of 30-35%, with global hypokinesis.  Abdominal aortogram reveals no evidence of renal artery stenosis.  HEMODYNAMICS: Systemic arterial pressure 147/106, LV systemic pressure 147/18, LV EDP of 25.  CONCLUSIONS:  1. No significant coronary artery disease.  2. Moderately depressed left ventricular systolic function.  3. No infrarenal artery stenosis.  4. Systemic hypertension.   Surgical History:  Past Surgical History  Procedure Date  . Icd placement   . Cardiac pacemaker placement   . Left total knee replacement   . Cholecystectomy   . Esophagogastroduodenoscopy 04/05/2011    Procedure: ESOPHAGOGASTRODUODENOSCOPY (EGD);  Surgeon: Irene Shipper, MD;  Location[facility] ENDOSCOPY;  Service: Endoscopy;  Laterality: N/A;  . Joint replacement     left knee replacement  . Cardiac catheterization      Home Meds: Medication Sig  amLODipine (NORVASC) 5 MG tablet Take 1 tablet (5 mg total) by mouth daily.  atorvastatin (LIPITOR) 20 MG tablet Take 1 tablet (20 mg total) by mouth daily at 6 PM.  colchicine 0.6 MG tablet Take 0.6 mg by mouth daily as needed. For gout flare ups  digoxin (LANOXIN)  0.125 MG tablet Take 1 tablet (0.125 mg total) by mouth daily.  furosemide (LASIX) 40 MG tablet Take 1 tablet (40 mg total) by mouth 2 (two) times daily.  insulin aspart (NOVOLOG FLEXPEN) 100 UNIT/ML injection Inject 25 Units into the skin 3 (three) times daily before meals. And pen needles 4/day  insulin glargine (LANTUS) 100 UNIT/ML injection Inject 85 Units into the skin at bedtime.   lisinopril (PRINIVIL,ZESTRIL) 40 MG tablet Take 1 tablet (40 mg total) by mouth daily.  magnesium oxide (MAG-OX 400) 400 MG tablet Take 1 tablet (400 mg total) by mouth daily.  metoprolol (TOPROL XL) 200 MG 24 hr tablet Take 1 tablet (200 mg total) by mouth daily.  nitroGLYCERIN (NITROSTAT) 0.4 MG SL tablet Place 0.4 mg under the tongue every 5 (five) minutes as needed. For chest pain.  omeprazole (PRILOSEC) 40 MG capsule Take 1 capsule (40 mg total) by mouth daily.  oxyCODONE-acetaminophen (PERCOCET) 5-325 MG per tablet Take 1 tablet by mouth daily as needed. For pain  pantoprazole (PROTONIX) 40 MG tablet Take 1 tablet by mouth 2 (two) times daily.  potassium chloride SA (K-DUR,KLOR-CON)  20 MEQ tablet Take 1 tablet (20 mEq total) by mouth daily.  spironolactone (ALDACTONE) 25 MG tablet Take 1 tablet (25 mg total) by mouth daily.  warfarin (COUMADIN) 5 MG tablet Take 5 mg by mouth daily. Tuesday and sundays    Inpatient Medications:   . amLODipine  5 mg Oral Daily  . aspirin  324 mg Oral Once  . atorvastatin  20 mg Oral q1800  . digoxin  0.125 mg Oral Daily  . furosemide  40 mg Oral BID  . insulin aspart  0-9 Units Subcutaneous TID WC  . insulin aspart  25 Units Subcutaneous TID AC  . insulin glargine  85 Units Subcutaneous QHS  . lisinopril  40 mg Oral Daily  . magnesium oxide  400 mg Oral Daily  . metoprolol  200 mg Oral Daily  . nitroGLYCERIN  1 inch Topical Once  . pantoprazole  40 mg Oral BID  . potassium chloride SA  20 mEq Oral Daily  . sodium chloride  3 mL Intravenous Q12H  . sodium chloride   3 mL Intravenous Q12H  . spironolactone  25 mg Oral Daily  . warfarin  15 mg Oral ONCE-1800  . Warfarin - Pharmacist Dosing Inpatient   Does not apply q1800  . DISCONTD: meclizine  50 mg Oral Once  . DISCONTD: pantoprazole  40 mg Oral Q1200    Allergies:  Allergies  Allergen Reactions  . Orange Fruit Anaphylaxis  . Penicillins Anaphylaxis    History   Social History  . Marital Status: Married    Spouse Name: N/A    Number of Children: 3  . Years of Education: college   Occupational History  . bar tender    Social History Main Topics  . Smoking status: Never Smoker   . Smokeless tobacco: Never Used  . Alcohol Use: No  . Drug Use: No  . Sexually Active: Not on file   Other Topics Concern  . Not on file   Social History Narrative   Lives in [place].Studied sports medicine in college, got a BS - used to work with the [employer].     Family History  Problem Relation Age of Onset  . Diabetes Mother   . Coronary artery disease Mother 22    Died of MI at 62  . Gout Mother   . Heart disease Mother   . Heart attack Mother   . Diabetes Father   . Coronary artery disease Father 74    Died in a house fire   . Heart disease Father   . Heart disease Brother      Review of Systems: General: negative for chills, fever, night sweats or weight changes.  Cardiovascular: As per HPI Dermatological: negative for rash Respiratory: negative for cough or wheezing Urologic: negative for hematuria Abdominal: (+) nausea; negative for vomiting, diarrhea, bright red blood per rectum, melena, or hematemesis Neurologic: (+) dizziness; negative for visual changes, syncope All other systems reviewed and are otherwise negative except as noted above.  Labs:  [facility] 10/15/11 0310 10/14/11 1841  TROPONINI <0.30 <0.30     10/14/2011 19:26 10/14/2011 21:36 10/15/2011 01:49  Troponin i, poc 0.00 0.01 0.01   Component Value Date   WBC 7.5 10/15/2011   HGB 13.3 10/15/2011   HCT 37.9*  10/15/2011   MCV 80.3 10/15/2011   PLT 197 10/15/2011     10/14/2011 19:08  Prothrombin Time 13.8  INR 1.04   Lab 10/15/11 0311  NA 139  K 3.7  CL 105  CO2 27  BUN 14  CREATININE 0.89  CALCIUM 8.7  PROT 5.7*  BILITOT 0.5  ALKPHOS 66  ALT 25  AST 16  GLUCOSE 301*    Radiology/Studies:   10/14/2011 - CT ANGIOGRAPHY CHEST Findings: Status post pacemaker. Atrial and ventricular leads in good position.  Status post median sternotomy for CABG. Cardiomegaly.  Coronary calcification.  No pericardial effusion. No pleural effusion.  Mild vascular congestion. No pneumothorax or consolidation. No pathologic hilar or mediastinal lymph node enlargement.  No axillary adenopathy.  Good opacification of the central and peripheral pulmonary vasculature.  No evidence for filling defect to suggest acute PE. Moderately dilated left atrium.  No intracardiac thrombus seen.  No acute osseous findings.  Upper abdominal structures unremarkable.  Prior cholecystectomy.  IMPRESSION: No evidence for PE.  No acute cardiopulmonary findings. Mild vascular congestion.  Similar appearance to 07/17/2011.    10/14/2011 -  PORTABLE CHEST - 1 VIEW   Findings: Cardiac enlargement.  Median sternotomy and AICD are unchanged.  Negative for heart failure or edema.  No infiltrate or effusion.  IMPRESSION: Cardiac enlargement without acute abnormality.     EKG: 10/14/11 @ 1837 - Sinus tachycardia 116bpm, LVH with repolarization abnl, unchanged from prior EKG  Physical Exam: Blood pressure 152/102, pulse 113, temperature 98.6 F (37 C), temperature source Oral, resp. rate 20, height 6\' 3"  (1.905 m), weight 257 lb 4.4 oz (116.7 kg), SpO2 100.00%. General: Pleasant overweight middle aged white male in no acute distress. Head: Normocephalic, atraumatic, sclera non-icteric, no xanthomas, nares are without discharge.  Neck: Supple. Negative for carotid bruits. JVD not elevated. Lungs: Clear bilaterally to auscultation without  wheezes, rales, or rhonchi. Breathing is unlabored. Heart: RRR with S1 S2. No murmurs, rubs, or gallops appreciated. Abdomen: Soft, non-tender, non-distended with normoactive bowel sounds.  No rebound/guarding. No obvious abdominal masses. Msk:  Strength and tone appear normal for age. Extremities: No clubbing or cyanosis. No edema. Chronic skin discoloration to bilat ankles. Distal pedal pulses are intact and equal bilaterally. Neuro: Alert and oriented X 3. Moves all extremities spontaneously. Psych:  Responds to questions appropriately with a normal affect.     Telemetry: I Ron Parker) have reviewed telemetry today October 15, 2011. There is sinus rhythm at this time.  Assessment and Plan:  41 y.o. male w/ PMHx significant for Nonobstructive CAD (40% LAD by cath '02), Dilated CM (s/p Medtronic ICD '08), Chronic Systolic CHF (EF 123456 by echo 06/2011), PAF (on coumadin), PUD/UGI bleed (03/2011), HTN, HLD, DMII, and Obesity who presented to [facility] on 10/14/2011 with complaints of palpitations and chest pain.  1. Chest pain: Patient presents with palpitations and atypical chest pain. No objective evidence of ischemia. No ICD shock or syncope. CTA chest neg for PE or other acute cardiopulmonary findings. Symptoms resolved. Will interrogate ICD.  If no significant findings can be dc'd from a cardiac standpoint. Educated on the importance of strict medication compliance.  2. Dilated Cardiomyopathy: EF 20%. Euvolemic on exam. Cont meds.  3. Paroxysmal Atrial Fibrillation: In sinus rhythm. Rates now 80-90s. Cont Toprol XL. Subtherapeutic INR. Cont coumadin per pharmacy. No indication for bridging.  Signed, HOPE, JESSICA PA-C 10/15/2011, 7:43 AM Patient seen and examined. I agree with the assessment and plan as detailed above. See also my additional thoughts below.   I have seen and examined the patient. I have reviewed all the data in the note above and handed a few additional notes. The  patient is stable  at this time. It seems that he was bothered by sinus tachycardia yesterday. We will have to wait ICD interrogation to see if there was any other rhythm abnormality. Clinically it appears he did not receive an ICD shock. He feels much better today and is rate is under better control. We will have his ICD interrogated. His medicines have been resumed. I less something unusual is seen with the ICD interrogation, he could be discharged home with followup with Dr. Aundra Dubin.  Dola Argyle, MD, [facility] 10/15/2011 9:16 AM .   Addendum: ICD interrogated by [employer] representative. No events noted. Per the rep, she spoke with Dr. Lovena Le and decreased his capture zone to 140bpm. Please see the printed report in his paper chart. Discussed with Dr. Ron Parker and notified primary team. Janett Billow Hope/ Daryel November, MD

## 2011-10-15 NOTE — Progress Notes (Signed)
Notified MD of results of interrogation of Pacemaker/ICD.

## 2011-10-15 NOTE — Progress Notes (Signed)
Notified by Interrogation company/tech that no events occurred during interrogation of pacemaker/ICD and states that patient is okay to go home. Will notify MD and continue to monitor patient.

## 2011-10-15 NOTE — Progress Notes (Signed)
ANTICOAGULATION CONSULT NOTE - Initial Consult  Pharmacy Consult for Coumadin Indication: atrial fibrillation  Allergies  Allergen Reactions  . Orange Fruit Anaphylaxis  . Penicillins Anaphylaxis    Patient Measurements: Height: 6\' 3"  (190.5 cm) Weight: 257 lb 4.4 oz (116.7 kg) IBW/kg (Calculated) : 84.5   Vital Signs: Temp: 98.6 F (37 C) (09/17 0236) Temp src: Oral (09/17 0236) BP: 152/102 mmHg (09/17 0236) Pulse Rate: 113  (09/17 0236)  Labs:  Basename 10/14/11 1908 10/14/11 1841  HGB -- 14.4  HCT -- 40.4  PLT -- 223  APTT 26 --  LABPROT 13.8 --  INR 1.04 --  HEPARINUNFRC -- --  CREATININE -- 0.85  CKTOTAL -- --  CKMB -- --  TROPONINI -- <0.30    Estimated Creatinine Clearance: 156 ml/min (by C-G formula based on Cr of 0.85).   Medical History: Past Medical History  Diagnosis Date  . Dilated cardiomyopathy     status post ICD placement in 2008 c/b tearing at the atrial junction resulting in tamponade and urgent thoracotomy  . Gout, unspecified   . Dyslipidemia   . Hypertension   . Diabetes uncomplicated adult-type II     on insulin therapy, a1c 12.9 in 01/2011  . Chronic systolic heart failure     2D echo (123456) - Systolic function severely reduced., LV EF 20-25%. Akinesis of apical and anteroseptal mycoardium.  last 2D echo (11/2008 ), LV EF 25-30%, diffuse hypokinesis, and grade 1 diastolic dysfunction.  Marland Kitchen CAD (coronary artery disease)     Cardiac cath (08/19/2000) - Nonobstructive, 40% stenosis of LAD,.   . Paroxysmal atrial fibrillation     on chronic coumadin, goal INR 2-3. status post direct current  . Obesity (BMI 30.0-34.9)   . Hepatic steatosis 2011    seen on ultrasound.   . Anginal pain   . Myocardial infarction 2008  . ICD (implantable cardiac defibrillator) in place 2007  . Shortness of breath   . Pacemaker   . CHF (congestive heart failure)   . Cancer 2013    benign stomach cancer  . Peptic ulcer     Medications:    Prescriptions prior to admission  Medication Sig Dispense Refill  . amLODipine (NORVASC) 5 MG tablet Take 1 tablet (5 mg total) by mouth daily.  30 tablet  11  . atorvastatin (LIPITOR) 20 MG tablet Take 1 tablet (20 mg total) by mouth daily at 6 PM.  30 tablet  11  . colchicine 0.6 MG tablet Take 0.6 mg by mouth daily as needed. For gout flare ups      . digoxin (LANOXIN) 0.125 MG tablet Take 1 tablet (0.125 mg total) by mouth daily.  30 tablet  6  . furosemide (LASIX) 40 MG tablet Take 1 tablet (40 mg total) by mouth 2 (two) times daily.  60 tablet  6  . insulin aspart (NOVOLOG FLEXPEN) 100 UNIT/ML injection Inject 25 Units into the skin 3 (three) times daily before meals. And pen needles 4/day  30 mL  12  . insulin glargine (LANTUS) 100 UNIT/ML injection Inject 85 Units into the skin at bedtime.       Marland Kitchen lisinopril (PRINIVIL,ZESTRIL) 40 MG tablet Take 1 tablet (40 mg total) by mouth daily.  30 tablet  11  . magnesium oxide (MAG-OX 400) 400 MG tablet Take 1 tablet (400 mg total) by mouth daily.  30 tablet  11  . metoprolol (TOPROL XL) 200 MG 24 hr tablet Take 1 tablet (200 mg total)  by mouth daily.  30 tablet  6  . nitroGLYCERIN (NITROSTAT) 0.4 MG SL tablet Place 0.4 mg under the tongue every 5 (five) minutes as needed. For chest pain.      Marland Kitchen omeprazole (PRILOSEC) 40 MG capsule Take 1 capsule (40 mg total) by mouth daily.  30 capsule  11  . oxyCODONE-acetaminophen (PERCOCET) 5-325 MG per tablet Take 1 tablet by mouth daily as needed. For pain      . pantoprazole (PROTONIX) 40 MG tablet Take 1 tablet by mouth 2 (two) times daily.      . potassium chloride SA (K-DUR,KLOR-CON) 20 MEQ tablet Take 1 tablet (20 mEq total) by mouth daily.  30 tablet  6  . spironolactone (ALDACTONE) 25 MG tablet Take 1 tablet (25 mg total) by mouth daily.  30 tablet  6  . warfarin (COUMADIN) 5 MG tablet Take 5 mg by mouth daily. Tuesday and sundays       Scheduled:    . amLODipine  5 mg Oral Daily  . aspirin  324 mg  Oral Once  . atorvastatin  20 mg Oral q1800  . digoxin  0.125 mg Oral Daily  . furosemide  40 mg Oral BID  . insulin aspart  0-9 Units Subcutaneous TID WC  . insulin aspart  25 Units Subcutaneous TID AC  . insulin glargine  85 Units Subcutaneous QHS  . lisinopril  40 mg Oral Daily  . magnesium oxide  400 mg Oral Daily  . metoprolol  200 mg Oral Daily  . nitroGLYCERIN  1 inch Topical Once  . pantoprazole  40 mg Oral BID  . potassium chloride SA  20 mEq Oral Daily  . sodium chloride  3 mL Intravenous Q12H  . sodium chloride  3 mL Intravenous Q12H  . spironolactone  25 mg Oral Daily  . warfarin  15 mg Oral ONCE-1800  . Warfarin - Pharmacist Dosing Inpatient   Does not apply q1800  . DISCONTD: meclizine  50 mg Oral Once  . DISCONTD: pantoprazole  40 mg Oral Q1200    Assessment: 41yo male c/o CP, CE and CT negative, to continue Coumadin for Afib; admitted with INR at baseline though pt says he takes his Coumadin, per outpt records pt has been difficult to achieve and maintain therapeutic INR, compliance questionable.  Goal of Therapy:  INR 2-2.5   Plan:  Will give boosted Coumadin dose of 15mg  po x1 today and monitor INR for dose adjustments.  Rogue Bussing PharmD BCPS 10/15/2011,3:40 AM

## 2011-10-15 NOTE — H&P (Signed)
Corey Palmer is an 41 y.o. male.   Patient was seen and examined on October 15, 2011 at 2:45 AM. PCP - Dr. Renato Shin. Cardiologist - Dr. Aundra Dubin. Chief Complaint: Chest pain. HPI: 41 year old male with history of nonischemic cardiomyopathy status post AICD placement, atrial fibrillation who was recently restarted on Coumadin after GI bleed in early part of this year secondary to antral ulcers, presents with complaints of chest pain. Patient's chest pain started around 5 PM last evening which was right-sided and middle of the chest. The pain is more of a stabbing nature nonradiating with no services shortness of breath but did have palpitations. In the ER cardiac enzymes were negative. CT angiogram of the chest was negative for PE. Patient will be admitted for further observation. Patient was mildly nauseated initially but denies any abdominal pain vomiting or diarrhea.  Past Medical History  Diagnosis Date  . Dilated cardiomyopathy     status post ICD placement in 2008 c/b tearing at the atrial junction resulting in tamponade and urgent thoracotomy  . Gout, unspecified   . Dyslipidemia   . Hypertension   . Diabetes uncomplicated adult-type II     on insulin therapy, a1c 12.9 in 01/2011  . Chronic systolic heart failure     2D echo (123456) - Systolic function severely reduced., LV EF 20-25%. Akinesis of apical and anteroseptal mycoardium.  last 2D echo (11/2008 ), LV EF 25-30%, diffuse hypokinesis, and grade 1 diastolic dysfunction.  Marland Kitchen CAD (coronary artery disease)     Cardiac cath (08/19/2000) - Nonobstructive, 40% stenosis of LAD,.   . Paroxysmal atrial fibrillation     on chronic coumadin, goal INR 2-3. status post direct current  . Obesity (BMI 30.0-34.9)   . Hepatic steatosis 2011    seen on ultrasound.   . Anginal pain   . Myocardial infarction 2008  . ICD (implantable cardiac defibrillator) in place 2007  . Shortness of breath   . Pacemaker   . CHF (congestive heart failure)    . Cancer 2013    benign stomach cancer  . Peptic ulcer     Past Surgical History  Procedure Date  . Icd placement   . Cardiac pacemaker placement   . Left total knee replacement   . Cholecystectomy   . Esophagogastroduodenoscopy 04/05/2011    Procedure: ESOPHAGOGASTRODUODENOSCOPY (EGD);  Surgeon: Irene Shipper, MD;  Location[facility] ENDOSCOPY;  Service: Endoscopy;  Laterality: N/A;  . Joint replacement     left knee replacement  . Cardiac catheterization     Family History  Problem Relation Age of Onset  . Diabetes Mother   . Coronary artery disease Mother 22    Died of MI at 39  . Gout Mother   . Heart disease Mother   . Heart attack Mother   . Diabetes Father   . Coronary artery disease Father 66    Died in a house fire   . Heart disease Father   . Heart disease Brother    Social History:  reports that he has never smoked. He has never used smokeless tobacco. He reports that he does not drink alcohol or use illicit drugs.  Allergies:  Allergies  Allergen Reactions  . Orange Fruit Anaphylaxis  . Penicillins Anaphylaxis    Medications Prior to Admission  Medication Sig Dispense Refill  . amLODipine (NORVASC) 5 MG tablet Take 1 tablet (5 mg total) by mouth daily.  30 tablet  11  . atorvastatin (LIPITOR) 20 MG tablet  Take 1 tablet (20 mg total) by mouth daily at 6 PM.  30 tablet  11  . colchicine 0.6 MG tablet Take 0.6 mg by mouth daily as needed. For gout flare ups      . digoxin (LANOXIN) 0.125 MG tablet Take 1 tablet (0.125 mg total) by mouth daily.  30 tablet  6  . furosemide (LASIX) 40 MG tablet Take 1 tablet (40 mg total) by mouth 2 (two) times daily.  60 tablet  6  . insulin aspart (NOVOLOG FLEXPEN) 100 UNIT/ML injection Inject 25 Units into the skin 3 (three) times daily before meals. And pen needles 4/day  30 mL  12  . insulin glargine (LANTUS) 100 UNIT/ML injection Inject 85 Units into the skin at bedtime.       Marland Kitchen lisinopril (PRINIVIL,ZESTRIL) 40 MG tablet Take 1  tablet (40 mg total) by mouth daily.  30 tablet  11  . magnesium oxide (MAG-OX 400) 400 MG tablet Take 1 tablet (400 mg total) by mouth daily.  30 tablet  11  . metoprolol (TOPROL XL) 200 MG 24 hr tablet Take 1 tablet (200 mg total) by mouth daily.  30 tablet  6  . nitroGLYCERIN (NITROSTAT) 0.4 MG SL tablet Place 0.4 mg under the tongue every 5 (five) minutes as needed. For chest pain.      Marland Kitchen omeprazole (PRILOSEC) 40 MG capsule Take 1 capsule (40 mg total) by mouth daily.  30 capsule  11  . oxyCODONE-acetaminophen (PERCOCET) 5-325 MG per tablet Take 1 tablet by mouth daily as needed. For pain      . pantoprazole (PROTONIX) 40 MG tablet Take 1 tablet by mouth 2 (two) times daily.      . potassium chloride SA (K-DUR,KLOR-CON) 20 MEQ tablet Take 1 tablet (20 mEq total) by mouth daily.  30 tablet  6  . spironolactone (ALDACTONE) 25 MG tablet Take 1 tablet (25 mg total) by mouth daily.  30 tablet  6  . warfarin (COUMADIN) 5 MG tablet Take 5 mg by mouth daily. Tuesday and sundays        Results for orders placed during the hospital encounter of 10/14/11 (from the past 48 hour(s))  TROPONIN I     Status: Normal   Collection Time   10/14/11  6:41 PM      Component Value Range Comment   Troponin I <0.30  <0.30 ng/mL   CBC     Status: Normal   Collection Time   10/14/11  6:41 PM      Component Value Range Comment   WBC 8.4  4.0 - 10.5 K/uL    RBC 5.10  4.22 - 5.81 MIL/uL    Hemoglobin 14.4  13.0 - 17.0 g/dL    HCT 40.4  39.0 - 52.0 %    MCV 79.2  78.0 - 100.0 fL    MCH 28.2  26.0 - 34.0 pg    MCHC 35.6  30.0 - 36.0 g/dL    RDW 15.2  11.5 - 15.5 %    Platelets 223  150 - 400 K/uL   BASIC METABOLIC PANEL     Status: Abnormal   Collection Time   10/14/11  6:41 PM      Component Value Range Comment   Sodium 141  135 - 145 mEq/L    Potassium 4.0  3.5 - 5.1 mEq/L    Chloride 107  96 - 112 mEq/L    CO2 23  19 - 32 mEq/L  Glucose, Bld 215 (*) 70 - 99 mg/dL    BUN 15  6 - 23 mg/dL    Creatinine,  Ser 0.85  0.50 - 1.35 mg/dL    Calcium 8.9  8.4 - 10.5 mg/dL    GFR calc non Af Amer >90  >90 mL/min    GFR calc Af Amer >90  >90 mL/min   PROTIME-INR     Status: Normal   Collection Time   10/14/11  7:08 PM      Component Value Range Comment   Prothrombin Time 13.8  11.6 - 15.2 seconds    INR 1.04  0.00 - 1.49   APTT     Status: Normal   Collection Time   10/14/11  7:08 PM      Component Value Range Comment   aPTT 26  24 - 37 seconds   LIPASE, BLOOD     Status: Normal   Collection Time   10/14/11  7:08 PM      Component Value Range Comment   Lipase 27  11 - 59 U/L   URINALYSIS, ROUTINE W REFLEX MICROSCOPIC     Status: Abnormal   Collection Time   10/14/11  7:24 PM      Component Value Range Comment   Color, Urine YELLOW  YELLOW    APPearance CLEAR  CLEAR    Specific Gravity, Urine 1.029  1.005 - 1.030    pH 6.0  5.0 - 8.0    Glucose, UA 250 (*) NEGATIVE mg/dL    Hgb urine dipstick LARGE (*) NEGATIVE    Bilirubin Urine NEGATIVE  NEGATIVE    Ketones, ur NEGATIVE  NEGATIVE mg/dL    Protein, ur >300 (*) NEGATIVE mg/dL    Urobilinogen, UA 1.0  0.0 - 1.0 mg/dL    Nitrite NEGATIVE  NEGATIVE    Leukocytes, UA NEGATIVE  NEGATIVE   URINE RAPID DRUG SCREEN (HOSP PERFORMED)     Status: Normal   Collection Time   10/14/11  7:24 PM      Component Value Range Comment   Opiates NONE DETECTED  NONE DETECTED    Cocaine NONE DETECTED  NONE DETECTED    Benzodiazepines NONE DETECTED  NONE DETECTED    Amphetamines NONE DETECTED  NONE DETECTED    Tetrahydrocannabinol NONE DETECTED  NONE DETECTED    Barbiturates NONE DETECTED  NONE DETECTED   URINE MICROSCOPIC-ADD ON     Status: Abnormal   Collection Time   10/14/11  7:24 PM      Component Value Range Comment   Squamous Epithelial / LPF RARE  RARE    WBC, UA 0-2  <3 WBC/hpf    RBC / HPF 7-10  <3 RBC/hpf    Bacteria, UA RARE  RARE    Casts HYALINE CASTS (*) NEGATIVE   POCT I-STAT TROPONIN I     Status: Normal   Collection Time   10/14/11   7:26 PM      Component Value Range Comment   Troponin i, poc 0.00  0.00 - 0.08 ng/mL    Comment 3            POCT I-STAT TROPONIN I     Status: Normal   Collection Time   10/14/11  9:36 PM      Component Value Range Comment   Troponin i, poc 0.01  0.00 - 0.08 ng/mL    Comment 3            POCT I-STAT TROPONIN I  Status: Normal   Collection Time   10/15/11  1:49 AM      Component Value Range Comment   Troponin i, poc 0.01  0.00 - 0.08 ng/mL    Comment 3            GLUCOSE, CAPILLARY     Status: Abnormal   Collection Time   10/15/11  2:46 AM      Component Value Range Comment   Glucose-Capillary 277 (*) 70 - 99 mg/dL    Ct Angio Chest Pe W/cm &/or Wo Cm  10/14/2011  *RADIOLOGY REPORT*  Clinical Data: Tachycardia with chest pain.  History of pacemaker placement.  CT ANGIOGRAPHY CHEST  Technique:  Multidetector CT imaging of the chest using the standard protocol during bolus administration of intravenous contrast. Multiplanar reconstructed images including MIPs were obtained and reviewed to evaluate the vascular anatomy.  Contrast: 167mL OMNIPAQUE IOHEXOL 350 MG/ML SOLN  Comparison: Chest x-ray earlier in the day.  CT chest 07/17/2011.  Findings: Status post pacemaker. Atrial and ventricular leads in good position.  Status post median sternotomy for CABG. Cardiomegaly.  Coronary calcification.  No pericardial effusion. No pleural effusion.  Mild vascular congestion. No pneumothorax or consolidation. No pathologic hilar or mediastinal lymph node enlargement.  No axillary adenopathy.  Good opacification of the central and peripheral pulmonary vasculature.  No evidence for filling defect to suggest acute PE. Moderately dilated left atrium.  No intracardiac thrombus seen.  No acute osseous findings.  Upper abdominal structures unremarkable.  Prior cholecystectomy.  IMPRESSION: No evidence for PE.  No acute cardiopulmonary findings. Mild vascular congestion.  Similar appearance to 07/17/2011.    Original Report Authenticated By: Staci Righter, M.D.    Dg Chest Port 1 View  10/14/2011  *RADIOLOGY REPORT*  Clinical Data: Chest pain  PORTABLE CHEST - 1 VIEW  Comparison: 07/16/2011  Findings: Cardiac enlargement.  Median sternotomy and AICD are unchanged.  Negative for heart failure or edema.  No infiltrate or effusion.  IMPRESSION: Cardiac enlargement without acute abnormality.   Original Report Authenticated By: Truett Perna, M.D.     Review of Systems  Constitutional: Negative.   HENT: Negative.   Eyes: Negative.   Respiratory: Negative.   Cardiovascular: Positive for chest pain and palpitations.  Gastrointestinal: Negative.   Genitourinary: Negative.   Musculoskeletal: Negative.   Skin: Negative.   Neurological: Negative.   Endo/Heme/Allergies: Negative.   Psychiatric/Behavioral: Negative.     Blood pressure 152/102, pulse 113, temperature 98.6 F (37 C), temperature source Oral, resp. rate 20, height 6\' 3"  (1.905 m), weight 116.7 kg (257 lb 4.4 oz), SpO2 100.00%. Physical Exam  Constitutional: He is oriented to person, place, and time. He appears well-developed and well-nourished. No distress.  HENT:  Head: Normocephalic and atraumatic.  Right Ear: External ear normal.  Left Ear: External ear normal.  Nose: Nose normal.  Mouth/Throat: Oropharynx is clear and moist. No oropharyngeal exudate.  Eyes: Conjunctivae normal are normal. Pupils are equal, round, and reactive to light. Right eye exhibits no discharge. Left eye exhibits no discharge. No scleral icterus.  Neck: Normal range of motion. Neck supple.  Cardiovascular: Regular rhythm.        Tachycardia.  Respiratory: Effort normal and breath sounds normal. No respiratory distress. He has no wheezes. He has no rales.  GI: Soft. Bowel sounds are normal. He exhibits no distension. There is no tenderness. There is no rebound.  Musculoskeletal: Normal range of motion. He exhibits no edema and no tenderness.  Neurological: He is alert and oriented to person, place, and time.       Moves all extremities.  Skin: Skin is warm and dry. He is not diaphoretic.     Assessment/Plan #1. Chest pain - looks atypical. Cycle cardiac markers. Consult cardiology in a.m. #2. History of atrial fibrillation presently in sinus tachycardia - patient did not take his Toprol yesterday morning. I have ordered one was for now. Continue Coumadin per pharmacy. #3. Nonischemic cardio myopathy - presently not short of breath. Continue diuretics. #4. Diabetes mellitus2 - continue home medications with sliding-scale coverage. #5. Hypertension - continue home medications. #6. History of GI bleed secondary to antral ulcers - follow CBC.  CODE STATUS - full code.  Kataya Guimont N. 10/15/2011, 3:01 AM

## 2011-10-15 NOTE — Progress Notes (Signed)
Utilization review complete 

## 2011-10-24 ENCOUNTER — Other Ambulatory Visit: Payer: Self-pay | Admitting: Internal Medicine

## 2011-10-24 NOTE — Telephone Encounter (Signed)
Not IM patient

## 2011-10-25 ENCOUNTER — Ambulatory Visit (INDEPENDENT_AMBULATORY_CARE_PROVIDER_SITE_OTHER): Payer: Medicaid Other | Admitting: Endocrinology

## 2011-10-25 ENCOUNTER — Encounter: Payer: Self-pay | Admitting: Endocrinology

## 2011-10-25 VITALS — BP 114/88 | HR 76 | Temp 97.5°F | Resp 20 | Ht 75.0 in | Wt 263.0 lb

## 2011-10-25 DIAGNOSIS — E119 Type 2 diabetes mellitus without complications: Secondary | ICD-10-CM

## 2011-10-25 MED ORDER — ACCU-CHEK FASTCLIX LANCETS MISC: 1.0000 | Freq: Two times a day (BID) | Status: DC

## 2011-10-25 MED ORDER — INSULIN ASPART 100 UNIT/ML ~~LOC~~ SOLN: 35.0000 [IU] | Freq: Three times a day (TID) | SUBCUTANEOUS | Status: DC

## 2011-10-25 MED ORDER — INSULIN GLARGINE 100 UNIT/ML ~~LOC~~ SOLN: 85.0000 [IU] | Freq: Every day | SUBCUTANEOUS | Status: DC

## 2011-10-25 NOTE — Patient Instructions (Addendum)
check your blood sugar 2 times a day.  vary the time of day when you check, between before the 3 meals, and at bedtime.  also check if you have symptoms of your blood sugar being too high or too low.  please keep a record of the readings and bring it to your next appointment here.  please call us sooner if your blood sugar goes below 70, or if you have a lot of readings over 200.  Continue the same lantus, and:  increase novolog to 35 units 3 times a day (just before each meal), no matter what your blood sugar is.   Please come back for a follow-up appointment in 2 weeks.

## 2011-10-25 NOTE — Progress Notes (Signed)
Subjective:    Patient ID: Corey Palmer, male    DOB: Jun 22, 1970, 41 y.o.   MRN: [id number]  HPI pt returns for f/u of insulin-requiring DM (dx'ed [id number]; complicated by CAD and peripheral sensory neuropathy). no cbg record, but states cbg's are mostly in the 200's.  There is no trend throughout the day.  Since last ov, he was in the hospital for CHF. Past Medical History  Diagnosis Date  . Dilated cardiomyopathy     status post ICD placement in 2008 c/b tearing at the atrial junction resulting in tamponade and urgent thoracotomy  . Gout, unspecified   . Dyslipidemia   . Hypertension   . Diabetes uncomplicated adult-type II     on insulin therapy, a1c 12.9 in 01/2011  . Chronic systolic heart failure     2D echo (123456) - Systolic function severely reduced., LV EF 20-25%. Akinesis of apical and anteroseptal mycoardium.  last 2D echo (11/2008 ), LV EF 25-30%, diffuse hypokinesis, and grade 1 diastolic dysfunction.  Marland Kitchen CAD (coronary artery disease)     Cardiac cath (08/19/2000) - Nonobstructive, 40% stenosis of LAD,.   . Paroxysmal atrial fibrillation     on chronic coumadin, goal INR 2-3. status post direct current  . Obesity (BMI 30.0-34.9)   . Hepatic steatosis 2011    seen on ultrasound.   . Anginal pain   . Myocardial infarction 2008  . ICD (implantable cardiac defibrillator) in place 2007  . Shortness of breath   . Pacemaker   . CHF (congestive heart failure)   . Cancer 2013    benign stomach cancer  . Peptic ulcer     Past Surgical History  Procedure Date  . Icd placement   . Cardiac pacemaker placement   . Left total knee replacement   . Cholecystectomy   . Esophagogastroduodenoscopy 04/05/2011    Procedure: ESOPHAGOGASTRODUODENOSCOPY (EGD);  Surgeon: Irene Shipper, MD;  Location: [facility] ENDOSCOPY;  Service: Endoscopy;  Laterality: N/A;  . Joint replacement     left knee replacement  . Cardiac catheterization     History   Social History  . Marital Status: Married   Spouse Name: N/A    Number of Children: 3  . Years of Education: college   Occupational History  . bar tender    Social History Main Topics  . Smoking status: Never Smoker   . Smokeless tobacco: Never Used  . Alcohol Use: No  . Drug Use: No  . Sexually Active: Not on file   Other Topics Concern  . Not on file   Social History Narrative   Lives in [place].Studied sports medicine in college, got a BS - used to work with the [employer].    Current Outpatient Prescriptions on File Prior to Visit  Medication Sig Dispense Refill  . amLODipine (NORVASC) 5 MG tablet Take 1 tablet (5 mg total) by mouth daily.  30 tablet  11  . atorvastatin (LIPITOR) 20 MG tablet Take 1 tablet (20 mg total) by mouth daily at 6 PM.  30 tablet  11  . colchicine 0.6 MG tablet Take 0.6 mg by mouth daily as needed. For gout flare ups      . digoxin (LANOXIN) 0.125 MG tablet Take 1 tablet (0.125 mg total) by mouth daily.  30 tablet  6  . furosemide (LASIX) 40 MG tablet Take 1 tablet (40 mg total) by mouth 2 (two) times daily.  60 tablet  6  . lisinopril (PRINIVIL,ZESTRIL) 40 MG tablet  Take 1 tablet (40 mg total) by mouth daily.  30 tablet  11  . magnesium oxide (MAG-OX 400) 400 MG tablet Take 1 tablet (400 mg total) by mouth daily.  30 tablet  11  . metoprolol (TOPROL XL) 200 MG 24 hr tablet Take 1 tablet (200 mg total) by mouth daily.  30 tablet  6  . nitroGLYCERIN (NITROSTAT) 0.4 MG SL tablet Place 0.4 mg under the tongue every 5 (five) minutes as needed. For chest pain.      Marland Kitchen omeprazole (PRILOSEC) 40 MG capsule Take 1 capsule (40 mg total) by mouth daily.  30 capsule  11  . oxyCODONE-acetaminophen (PERCOCET) 5-325 MG per tablet Take 1 tablet by mouth daily as needed. For pain      . pantoprazole (PROTONIX) 40 MG tablet Take 1 tablet by mouth 2 (two) times daily.      . potassium chloride SA (K-DUR,KLOR-CON) 20 MEQ tablet Take 1 tablet (20 mEq total) by mouth daily.  30 tablet  6  . spironolactone (ALDACTONE)  25 MG tablet Take 1 tablet (25 mg total) by mouth daily.  30 tablet  6  . warfarin (COUMADIN) 5 MG tablet Take 5 mg by mouth daily. Tuesday and sundays      . DISCONTD: warfarin (COUMADIN) 5 MG tablet Take 1 tablet (5 mg total) by mouth daily. Or as directed.  60 tablet  11    Allergies  Allergen Reactions  . Orange Fruit Anaphylaxis  . Penicillins Anaphylaxis    Family History  Problem Relation Age of Onset  . Diabetes Mother   . Coronary artery disease Mother 73    Died of MI at 57  . Gout Mother   . Heart disease Mother   . Heart attack Mother   . Diabetes Father   . Coronary artery disease Father 83    Died in a house fire   . Heart disease Father   . Heart disease Brother     BP 114/88  Pulse 76  Temp 97.5 F (36.4 C) (Oral)  Resp 20  Ht 6\' 3"  (1.905 m)  Wt 263 lb (119.296 kg)  BMI 32.87 kg/m2  Review of Systems denies hypoglycemia    Objective:   Physical Exam VITAL SIGNS:  See vs page GENERAL: no distress SKIN:  Insulin injection sites at the anterior abdomen are normal    Assessment & Plan:  DM, needs increased rx

## 2011-11-01 ENCOUNTER — Encounter: Payer: Self-pay | Admitting: Cardiology

## 2011-11-01 ENCOUNTER — Encounter: Payer: Self-pay | Admitting: Internal Medicine

## 2011-11-01 ENCOUNTER — Ambulatory Visit (INDEPENDENT_AMBULATORY_CARE_PROVIDER_SITE_OTHER)
Admission: RE | Admit: 2011-11-01 | Discharge: 2011-11-01 | Disposition: A | Discharge status: Home / Self Care | Payer: Medicaid Other | Source: Ambulatory Visit | Attending: Cardiology | Admitting: Cardiology

## 2011-11-01 ENCOUNTER — Ambulatory Visit (INDEPENDENT_AMBULATORY_CARE_PROVIDER_SITE_OTHER): Payer: Medicaid Other | Admitting: Cardiology

## 2011-11-01 ENCOUNTER — Encounter: Payer: Medicaid Other | Admitting: Cardiology

## 2011-11-01 ENCOUNTER — Ambulatory Visit (INDEPENDENT_AMBULATORY_CARE_PROVIDER_SITE_OTHER): Payer: Medicaid Other | Admitting: *Deleted

## 2011-11-01 VITALS — BP 124/90 | HR 87 | Ht 75.0 in | Wt 259.8 lb

## 2011-11-01 DIAGNOSIS — I428 Other cardiomyopathies: Secondary | ICD-10-CM

## 2011-11-01 DIAGNOSIS — I5023 Acute on chronic systolic (congestive) heart failure: Secondary | ICD-10-CM

## 2011-11-01 DIAGNOSIS — E785 Hyperlipidemia, unspecified: Secondary | ICD-10-CM

## 2011-11-01 DIAGNOSIS — I509 Heart failure, unspecified: Secondary | ICD-10-CM

## 2011-11-01 DIAGNOSIS — I2510 Atherosclerotic heart disease of native coronary artery without angina pectoris: Secondary | ICD-10-CM

## 2011-11-01 DIAGNOSIS — I5022 Chronic systolic (congestive) heart failure: Secondary | ICD-10-CM

## 2011-11-01 DIAGNOSIS — I4891 Unspecified atrial fibrillation: Secondary | ICD-10-CM

## 2011-11-01 DIAGNOSIS — R9431 Abnormal electrocardiogram [ECG] [EKG]: Secondary | ICD-10-CM

## 2011-11-01 LAB — LIPID PANEL
Cholesterol: 227 mg/dL — ABNORMAL HIGH (ref 0–200)
HDL: 27.4 mg/dL — ABNORMAL LOW (ref >39.00)
Triglycerides: 537 mg/dL — ABNORMAL HIGH (ref 0.0–149.0)

## 2011-11-01 LAB — ICD DEVICE OBSERVATION
BATTERY VOLTAGE: 3.0946 V
BRDY-0002RV: 40 {beats}/min
FVT: 0
HV IMPEDENCE: 64 Ohm
PACEART VT: 0
RV LEAD IMPEDENCE ICD: 532 Ohm
RV LEAD THRESHOLD: 0.75 V
TZAT-0001FASTVT: 1
TZAT-0001SLOWVT: 1
TZAT-0002FASTVT: NEGATIVE
TZAT-0002SLOWVT: NEGATIVE
TZAT-0018FASTVT: NEGATIVE
TZAT-0018SLOWVT: NEGATIVE
TZAT-0019FASTVT: 8 V
TZON-0003VSLOWVT: 430 ms
TZON-0004SLOWVT: 16
TZON-0004VSLOWVT: 32
TZON-0005SLOWVT: 12
TZST-0001FASTVT: 3
TZST-0001FASTVT: 4
TZST-0001FASTVT: 5
TZST-0001SLOWVT: 2
TZST-0001SLOWVT: 3
TZST-0001SLOWVT: 4
TZST-0001SLOWVT: 6
TZST-0002FASTVT: NEGATIVE
TZST-0002FASTVT: NEGATIVE
TZST-0002FASTVT: NEGATIVE
TZST-0002SLOWVT: NEGATIVE
TZST-0002SLOWVT: NEGATIVE
TZST-0002SLOWVT: NEGATIVE

## 2011-11-01 LAB — BASIC METABOLIC PANEL
BUN: 21 mg/dL (ref 6–23)
CO2: 27 mEq/L (ref 19–32)
Calcium: 8.7 mg/dL (ref 8.4–10.5)
GFR: 83.84 mL/min (ref >60.00)
Glucose, Bld: 165 mg/dL — ABNORMAL HIGH (ref 70–99)
Sodium: 137 mEq/L (ref 135–145)

## 2011-11-01 MED ORDER — POTASSIUM CHLORIDE CRYS ER 20 MEQ PO TBCR: 40.0000 meq | EXTENDED_RELEASE_TABLET | Freq: Every day | ORAL | Status: DC

## 2011-11-01 MED ORDER — FUROSEMIDE 40 MG PO TABS: 60.0000 mg | ORAL_TABLET | Freq: Two times a day (BID) | ORAL | Status: DC

## 2011-11-01 NOTE — Progress Notes (Signed)
icd check in clinic  

## 2011-11-01 NOTE — Patient Instructions (Addendum)
Your physician recommends that you schedule a follow-up appointment in:  1 month with Dr. Aundra Dubin and 3 months with device clinic  A chest x-ray takes a picture of the organs and structures inside the chest, including the heart, lungs, and blood vessels. This test can show several things, including, whether the heart is enlarges; whether fluid is building up in the lungs; and whether pacemaker / defibrillator leads are still in place. To be done today at [facility] [place] office.   Your physician has recommended that you have a sleep study. This test records several body functions during sleep, including: brain activity, eye movement, oxygen and carbon dioxide blood levels, heart rate and rhythm, breathing rate and rhythm, the flow of air through your mouth and nose, snoring, body muscle movements, and chest and belly movement.   Your physician has recommended you make the following change in your medication:  Increase furosemide to 60 mg by mouth twice daily. Increase potassium to 40 meq by mouth daily.  Your physician recommends that you return for lab work in:  1 week--BMP, BNP

## 2011-11-03 NOTE — Progress Notes (Signed)
Patient ID: Corey Palmer, male   DOB: 15-Jun-1970, 41 y.o.   MRN: [id number] PCP: Dr. Victorio Palm  41 yo with history of nonischemic cardiomyopathy and paroxysmal atrial fibrillation presents for cardiology followup after recent admission with acute on chronic systolic CHF.  Patient was admitted in 6/13 with severe dyspnea.  He was diuresed to near-euvolemia.  He was seen by GI and later restarted on coumadin.  Coumadin had been stopped in 3/12 in the setting of an upper GI bleed from PUD.    Today, he feels like he has a cold.  He has been congested and coughing with low grade fever for about a week.  He denies signficant dyspnea walking on flat ground and can walk up a flight of steps without much trouble.  No chest pain.  He has gained 19 lbs since I last saw him.  Wife reports loud snoring, and he reports daytime sleepiness.  He was admitted in 9/13 for atypical chest pain.  CEs were negative and CTA chest showed no PE.    Optivol was interrogated.  He had a threshold crossing in 9/13 but is back below the threshold now.   ECG: NSR, iLBBB, LVH with repolarization abnormality.   Labs (6/13): K 4.4, creatinine 1.0, BNP 2793, digoxin 0.6 Labs (7/13): K 3.3, creatinine 0.9, digoxin 0.5 Labs (8/13): BNP 121 Labs (9/13): K 3.7, creatinine 0.89  ECG: NSR, LVH with repolarization abnormality, prolonged QT interval.   PMH:  1. Type II diabetes with peripheral neuropathy.  2. Hyperlipidemia 3. Atrial fibrillation: Paroxysmal. He was on dofetilide in the past but was not compliant with it.  He was taken off coumadin with upper GI bleed in 3/12 but is now back on coumadin.  4. Upper GI bleed: From antral ulcers, was in 3/12.  5. Gout 6. Dilated cardiomyopathy: Nonischemic.  [employer] ICD in [id number] (complicated by RA perforation and tamponade).  LHC in 2002 with 40% LAD stenosis. Echo (6/13) with EF 20%, akinetic anteroseptum, moderate MR, severe TR, moderately dilated RV.  7. CAD: LHC 2002 with 40% LAD.    SH: Unemployed truck Geophysicist/field seismologist.  Lives [place].  Married.  Nonsmoker.    FH: CHF.   ROS: All systems reviewed and negative except as per HPI.   Current Outpatient Prescriptions  Medication Sig Dispense Refill  . ACCU-CHEK FASTCLIX LANCETS MISC 1 each by Does not apply route 2 (two) times daily.  102 each  5  . amLODipine (NORVASC) 5 MG tablet Take 1 tablet (5 mg total) by mouth daily.  30 tablet  11  . atorvastatin (LIPITOR) 20 MG tablet Take 1 tablet (20 mg total) by mouth daily at 6 PM.  30 tablet  11  . colchicine 0.6 MG tablet Take 0.6 mg by mouth daily as needed. For gout flare ups      . digoxin (LANOXIN) 0.125 MG tablet Take 1 tablet (0.125 mg total) by mouth daily.  30 tablet  6  . furosemide (LASIX) 40 MG tablet Take 1.5 tablets (60 mg total) by mouth 2 (two) times daily.  90 tablet  6  . insulin aspart (NOVOLOG) 100 UNIT/ML injection Inject 35 Units into the skin 3 (three) times daily before meals. And pen needles 4/day  45 mL  11  . insulin glargine (LANTUS SOLOSTAR) 100 UNIT/ML injection Inject 85 Units into the skin at bedtime.  10 pen  PRN  . lisinopril (PRINIVIL,ZESTRIL) 40 MG tablet Take 1 tablet (40 mg total) by mouth daily.  30 tablet  11  . magnesium oxide (MAG-OX 400) 400 MG tablet Take 1 tablet (400 mg total) by mouth daily.  30 tablet  11  . metoprolol (TOPROL XL) 200 MG 24 hr tablet Take 1 tablet (200 mg total) by mouth daily.  30 tablet  6  . nitroGLYCERIN (NITROSTAT) 0.4 MG SL tablet Place 0.4 mg under the tongue every 5 (five) minutes as needed. For chest pain.       Marland Kitchen omeprazole (PRILOSEC) 40 MG capsule Take 1 capsule (40 mg total) by mouth daily.  30 capsule  11  . pantoprazole (PROTONIX) 40 MG tablet Take 1 tablet by mouth 2 (two) times daily.      . potassium chloride SA (K-DUR,KLOR-CON) 20 MEQ tablet Take 2 tablets (40 mEq total) by mouth daily.  60 tablet  6  . spironolactone (ALDACTONE) 25 MG tablet Take 1 tablet (25 mg total) by mouth daily.  30  tablet  6  . warfarin (COUMADIN) 5 MG tablet Take 5 mg by mouth as directed.       Marland Kitchen DISCONTD: warfarin (COUMADIN) 5 MG tablet Take 1 tablet (5 mg total) by mouth daily. Or as directed.  60 tablet  11    BP 124/90  Pulse 87  Ht 6\' 3"  (1.905 m)  Wt 259 lb 12 oz (117.822 kg)  BMI 32.47 kg/m2 General: NAD Neck: Thick, no JVD, no thyromegaly or thyroid nodule.  Lungs: Clear to auscultation bilaterally with normal respiratory effort. CV: Nondisplaced PMI.  Heart regular S1/S2, no S3/S4, 2/6 murmur LLSB.  Trace ankle edema.  No carotid bruit.  Normal pedal pulses.  Abdomen: Soft, nontender, no hepatosplenomegaly, no distention.  Neurologic: Alert and oriented x 3.  Psych: Normal affect. Extremities: No clubbing or cyanosis.    Assessment/Plan:  Atrial fibrillation  Patient remains in NSR. He is off dofetilide due to noncompliance. I have started him back on coumadin. He had an upper GI bleed from antral ulcers in 3/12. He was seen by GI and was deemed safe to restart coumadin as long as he remains on a PPI. He is on Protonix. Goal INR with coumadin will be 2-2.5. CAD  Nonobstructive. Will need to reassess lipids at next appointment. He is on atorvastatin.  Chronic systolic congestive heart failure Stable, NYHA class II symptoms currently. However, he has gained 19 lbs and had a recent Optivol threshold crossing.   - Increased Lasix to 60 mg bid, KCl to 40 mEq daily, and encouraged compliance with low sodium diet.  - BMET/BNP in 1 week.  - Continue current doses of digoxin, Lasix, lisinopril, and Toprol XL.  - He will need a CPX.   URI Symptoms Given h/o CHF and URI symptoms x 1 week now, I will get a CXR to r/o PNA.   OSA Strong suspicion for OSA.  I will get a sleep study.   Hafsah Hendler [employer]

## 2011-11-04 ENCOUNTER — Ambulatory Visit: Payer: Medicaid Other | Admitting: Cardiology

## 2011-11-04 ENCOUNTER — Other Ambulatory Visit: Payer: Self-pay | Admitting: *Deleted

## 2011-11-04 DIAGNOSIS — I4891 Unspecified atrial fibrillation: Secondary | ICD-10-CM

## 2011-11-04 DIAGNOSIS — E7800 Pure hypercholesterolemia, unspecified: Secondary | ICD-10-CM

## 2011-11-04 MED ORDER — OMEGA-3-ACID ETHYL ESTERS 1 G PO CAPS: 2.0000 g | ORAL_CAPSULE | Freq: Two times a day (BID) | ORAL | Status: DC

## 2011-11-08 ENCOUNTER — Other Ambulatory Visit: Payer: Self-pay | Admitting: *Deleted

## 2011-11-08 ENCOUNTER — Other Ambulatory Visit: Payer: Medicaid Other

## 2011-11-08 ENCOUNTER — Other Ambulatory Visit (INDEPENDENT_AMBULATORY_CARE_PROVIDER_SITE_OTHER): Payer: Medicaid Other

## 2011-11-08 ENCOUNTER — Encounter: Payer: Self-pay | Admitting: Endocrinology

## 2011-11-08 ENCOUNTER — Ambulatory Visit (INDEPENDENT_AMBULATORY_CARE_PROVIDER_SITE_OTHER): Payer: Medicaid Other | Admitting: Endocrinology

## 2011-11-08 VITALS — BP 126/80 | HR 68 | Temp 97.5°F | Wt 266.0 lb

## 2011-11-08 DIAGNOSIS — E119 Type 2 diabetes mellitus without complications: Secondary | ICD-10-CM

## 2011-11-08 DIAGNOSIS — I428 Other cardiomyopathies: Secondary | ICD-10-CM

## 2011-11-08 DIAGNOSIS — E785 Hyperlipidemia, unspecified: Secondary | ICD-10-CM

## 2011-11-08 DIAGNOSIS — I5022 Chronic systolic (congestive) heart failure: Secondary | ICD-10-CM

## 2011-11-08 DIAGNOSIS — I4891 Unspecified atrial fibrillation: Secondary | ICD-10-CM

## 2011-11-08 DIAGNOSIS — I509 Heart failure, unspecified: Secondary | ICD-10-CM

## 2011-11-08 DIAGNOSIS — I2510 Atherosclerotic heart disease of native coronary artery without angina pectoris: Secondary | ICD-10-CM

## 2011-11-08 DIAGNOSIS — E7800 Pure hypercholesterolemia, unspecified: Secondary | ICD-10-CM

## 2011-11-08 LAB — BASIC METABOLIC PANEL WITH GFR
BUN: 27 mg/dL — ABNORMAL HIGH (ref 6–23)
CO2: 29 meq/L (ref 19–32)
Calcium: 8.8 mg/dL (ref 8.4–10.5)
Chloride: 100 meq/L (ref 96–112)
Creatinine, Ser: 1 mg/dL (ref 0.4–1.5)
GFR: 85.75 mL/min
Glucose, Bld: 178 mg/dL — ABNORMAL HIGH (ref 70–99)
Potassium: 4.1 meq/L (ref 3.5–5.1)
Sodium: 139 meq/L (ref 135–145)

## 2011-11-08 MED ORDER — INSULIN ASPART 100 UNIT/ML ~~LOC~~ SOLN: 45.0000 [IU] | Freq: Three times a day (TID) | SUBCUTANEOUS | Status: DC

## 2011-11-08 NOTE — Patient Instructions (Addendum)
check your blood sugar 2 times a day.  vary the time of day when you check, between before the 3 meals, and at bedtime.  also check if you have symptoms of your blood sugar being too high or too low.  please keep a record of the readings and bring it to your next appointment here.  please call us sooner if your blood sugar goes below 70, or if you have a lot of readings over 200.  Continue the same lantus, and:  increase novolog to 45 units 3 times a day (just before each meal), no matter what your blood sugar is.   Please come back for a follow-up appointment in 2-3 weeks.

## 2011-11-08 NOTE — Telephone Encounter (Signed)
Opened in Error.

## 2011-11-08 NOTE — Progress Notes (Signed)
Subjective:    Patient ID: Corey Palmer, male    DOB: [birth year], 41 y.o.   MRN: [id number]  HPI pt returns for f/u of insulin-requiring DM (dx'ed [id number]; complicated by CAD and peripheral sensory neuropathy). no cbg record, but states cbg's vary from 176-300.  There is no trend throughout the day.  Past Medical History  Diagnosis Date  . Dilated cardiomyopathy     status post ICD placement in 2008 c/b tearing at the atrial junction resulting in tamponade and urgent thoracotomy  . Gout, unspecified   . Dyslipidemia   . Hypertension   . Diabetes uncomplicated adult-type II     on insulin therapy, a1c 12.9 in 01/2011  . Chronic systolic heart failure     2D echo (123456) - Systolic function severely reduced., LV EF 20-25%. Akinesis of apical and anteroseptal mycoardium.  last 2D echo (11/2008 ), LV EF 25-30%, diffuse hypokinesis, and grade 1 diastolic dysfunction.  Marland Kitchen CAD (coronary artery disease)     Cardiac cath (08/19/2000) - Nonobstructive, 40% stenosis of LAD,.   . Paroxysmal atrial fibrillation     on chronic coumadin, goal INR 2-3. status post direct current  . Obesity (BMI 30.0-34.9)   . Hepatic steatosis 2011    seen on ultrasound.   . Anginal pain   . Myocardial infarction 2008  . ICD (implantable cardiac defibrillator) in place 2007  . Shortness of breath   . Pacemaker   . CHF (congestive heart failure)   . Cancer 2013    benign stomach cancer  . Peptic ulcer     Past Surgical History  Procedure Date  . Icd placement   . Cardiac pacemaker placement   . Left total knee replacement   . Cholecystectomy   . Esophagogastroduodenoscopy 04/05/2011    Procedure: ESOPHAGOGASTRODUODENOSCOPY (EGD);  Surgeon: Irene Shipper, MD;  Location: [facility] ENDOSCOPY;  Service: Endoscopy;  Laterality: N/A;  . Joint replacement     left knee replacement  . Cardiac catheterization     History   Social History  . Marital Status: Married    Spouse Name: N/A    Number of Children: 3  . Years  of Education: college   Occupational History  . bar tender    Social History Main Topics  . Smoking status: Never Smoker   . Smokeless tobacco: Never Used  . Alcohol Use: No  . Drug Use: No  . Sexually Active: Not on file   Other Topics Concern  . Not on file   Social History Narrative   Lives in [place].Studied sports medicine in college, got a BS - used to work with the [employer].    Current Outpatient Prescriptions on File Prior to Visit  Medication Sig Dispense Refill  . ACCU-CHEK FASTCLIX LANCETS MISC 1 each by Does not apply route 2 (two) times daily.  102 each  5  . amLODipine (NORVASC) 5 MG tablet Take 1 tablet (5 mg total) by mouth daily.  30 tablet  11  . atorvastatin (LIPITOR) 20 MG tablet Take 1 tablet (20 mg total) by mouth daily at 6 PM.  30 tablet  11  . colchicine 0.6 MG tablet Take 0.6 mg by mouth daily as needed. For gout flare ups      . digoxin (LANOXIN) 0.125 MG tablet Take 1 tablet (0.125 mg total) by mouth daily.  30 tablet  6  . furosemide (LASIX) 40 MG tablet Take 1.5 tablets (60 mg total) by mouth 2 (two) times daily.  90 tablet  6  . insulin glargine (LANTUS SOLOSTAR) 100 UNIT/ML injection Inject 85 Units into the skin at bedtime.  10 pen  PRN  . lisinopril (PRINIVIL,ZESTRIL) 40 MG tablet Take 1 tablet (40 mg total) by mouth daily.  30 tablet  11  . magnesium oxide (MAG-OX 400) 400 MG tablet Take 1 tablet (400 mg total) by mouth daily.  30 tablet  11  . metoprolol (TOPROL XL) 200 MG 24 hr tablet Take 1 tablet (200 mg total) by mouth daily.  30 tablet  6  . nitroGLYCERIN (NITROSTAT) 0.4 MG SL tablet Place 0.4 mg under the tongue every 5 (five) minutes as needed. For chest pain.       Marland Kitchen omega-3 acid ethyl esters (LOVAZA) 1 G capsule Take 2 capsules (2 g total) by mouth 2 (two) times daily.  120 capsule  3  . omeprazole (PRILOSEC) 40 MG capsule Take 1 capsule (40 mg total) by mouth daily.  30 capsule  11  . pantoprazole (PROTONIX) 40 MG tablet Take 1 tablet  by mouth 2 (two) times daily.      . potassium chloride SA (K-DUR,KLOR-CON) 20 MEQ tablet Take 2 tablets (40 mEq total) by mouth daily.  60 tablet  6  . spironolactone (ALDACTONE) 25 MG tablet Take 1 tablet (25 mg total) by mouth daily.  30 tablet  6  . warfarin (COUMADIN) 5 MG tablet Take 5 mg by mouth as directed.       Marland Kitchen DISCONTD: warfarin (COUMADIN) 5 MG tablet Take 1 tablet (5 mg total) by mouth daily. Or as directed.  60 tablet  11    Allergies  Allergen Reactions  . Orange Fruit Anaphylaxis  . Penicillins Anaphylaxis    Family History  Problem Relation Age of Onset  . Diabetes Mother   . Coronary artery disease Mother 29    Died of MI at 47  . Gout Mother   . Heart disease Mother   . Heart attack Mother   . Diabetes Father   . Coronary artery disease Father 31    Died in a house fire   . Heart disease Father   . Heart disease Brother    BP 126/80  Pulse 68  Temp 97.5 F (36.4 C) (Oral)  Wt 266 lb (120.657 kg)  Review of Systems denies hypoglycemia    Objective:   Physical Exam VITAL SIGNS:  See vs page GENERAL: no distress PSYCH: Alert and oriented x 3.  Does not appear anxious nor depressed.     Assessment & Plan:  DM, needs increased rx

## 2011-11-13 ENCOUNTER — Encounter: Payer: Medicaid Other | Admitting: Cardiology

## 2011-11-13 ENCOUNTER — Ambulatory Visit: Payer: Medicaid Other | Admitting: Cardiology

## 2011-11-13 ENCOUNTER — Telehealth: Payer: Self-pay | Admitting: *Deleted

## 2011-11-13 NOTE — Telephone Encounter (Signed)
Lovaza 2 G bid authorized for 1 year. Medicaid [id number] [place] approval number [id number].

## 2011-11-17 ENCOUNTER — Other Ambulatory Visit: Payer: Self-pay | Admitting: Internal Medicine

## 2011-11-18 NOTE — Telephone Encounter (Signed)
Not a pt at [facility]; please send back to the pharmacy  thanks

## 2011-11-19 ENCOUNTER — Ambulatory Visit: Payer: Medicaid Other | Admitting: Endocrinology

## 2011-11-19 ENCOUNTER — Ambulatory Visit (INDEPENDENT_AMBULATORY_CARE_PROVIDER_SITE_OTHER): Payer: Medicaid Other | Admitting: Endocrinology

## 2011-11-19 ENCOUNTER — Encounter: Payer: Self-pay | Admitting: Endocrinology

## 2011-11-19 VITALS — BP 108/84 | HR 81 | Temp 97.9°F | Resp 16 | Wt 269.6 lb

## 2011-11-19 DIAGNOSIS — E119 Type 2 diabetes mellitus without complications: Secondary | ICD-10-CM

## 2011-11-19 NOTE — Patient Instructions (Addendum)
check your blood sugar 2 times a day.  vary the time of day when you check, between before the 3 meals, and at bedtime.  also check if you have symptoms of your blood sugar being too high or too low.  please keep a record of the readings and bring it to your next appointment here.  please call us sooner if your blood sugar goes below 70, or if you have a lot of readings over 200.  Please reduce the lantus to 70 units at bedtime.   increase novolog to 3 times a day (just before each meal), 45-70-50 units, no matter what your blood sugar is.   Please come back for a follow-up appointment in 3-4 weeks.

## 2011-11-19 NOTE — Progress Notes (Signed)
Subjective:    Patient ID: Corey Palmer, male    DOB: 1970-06-20, 41 y.o.   MRN: [id number]  HPI pt returns for f/u of insulin-requiring DM (dx'ed [id number]; complicated by CAD and peripheral sensory neuropathy). no cbg record, but states cbg's vary from 98-211.  It is lowest before lunch, and highest in the afternoon.  It it higher at hs (high-100's, than in am (mid-100's).  Past Medical History  Diagnosis Date  . Dilated cardiomyopathy     status post ICD placement in 2008 c/b tearing at the atrial junction resulting in tamponade and urgent thoracotomy  . Gout, unspecified   . Dyslipidemia   . Hypertension   . Diabetes uncomplicated adult-type II     on insulin therapy, a1c 12.9 in 01/2011  . Chronic systolic heart failure     2D echo (123456) - Systolic function severely reduced., LV EF 20-25%. Akinesis of apical and anteroseptal mycoardium.  last 2D echo (11/2008 ), LV EF 25-30%, diffuse hypokinesis, and grade 1 diastolic dysfunction.  Marland Kitchen CAD (coronary artery disease)     Cardiac cath (08/19/2000) - Nonobstructive, 40% stenosis of LAD,.   . Paroxysmal atrial fibrillation     on chronic coumadin, goal INR 2-3. status post direct current  . Obesity (BMI 30.0-34.9)   . Hepatic steatosis 2011    seen on ultrasound.   . Anginal pain   . Myocardial infarction 2008  . ICD (implantable cardiac defibrillator) in place 2007  . Shortness of breath   . Pacemaker   . CHF (congestive heart failure)   . Cancer 2013    benign stomach cancer  . Peptic ulcer     Past Surgical History  Procedure Date  . Icd placement   . Cardiac pacemaker placement   . Left total knee replacement   . Cholecystectomy   . Esophagogastroduodenoscopy 04/05/2011    Procedure: ESOPHAGOGASTRODUODENOSCOPY (EGD);  Surgeon: Irene Shipper, MD;  Location: [facility] ENDOSCOPY;  Service: Endoscopy;  Laterality: N/A;  . Joint replacement     left knee replacement  . Cardiac catheterization     History   Social History  .  Marital Status: Married    Spouse Name: N/A    Number of Children: 3  . Years of Education: college   Occupational History  . bar tender    Social History Main Topics  . Smoking status: Never Smoker   . Smokeless tobacco: Never Used  . Alcohol Use: No  . Drug Use: No  . Sexually Active: Not on file   Other Topics Concern  . Not on file   Social History Narrative   Lives in [place].Studied sports medicine in college, got a BS - used to work with the [employer].    Current Outpatient Prescriptions on File Prior to Visit  Medication Sig Dispense Refill  . ACCU-CHEK FASTCLIX LANCETS MISC 1 each by Does not apply route 2 (two) times daily.  102 each  5  . amLODipine (NORVASC) 5 MG tablet Take 1 tablet (5 mg total) by mouth daily.  30 tablet  11  . atorvastatin (LIPITOR) 20 MG tablet Take 1 tablet (20 mg total) by mouth daily at 6 PM.  30 tablet  11  . colchicine 0.6 MG tablet Take 0.6 mg by mouth daily as needed. For gout flare ups      . digoxin (LANOXIN) 0.125 MG tablet Take 1 tablet (0.125 mg total) by mouth daily.  30 tablet  6  . furosemide (LASIX) 40  MG tablet Take 1.5 tablets (60 mg total) by mouth 2 (two) times daily.  90 tablet  6  . lisinopril (PRINIVIL,ZESTRIL) 40 MG tablet Take 1 tablet (40 mg total) by mouth daily.  30 tablet  11  . magnesium oxide (MAG-OX 400) 400 MG tablet Take 1 tablet (400 mg total) by mouth daily.  30 tablet  11  . metoprolol (TOPROL XL) 200 MG 24 hr tablet Take 1 tablet (200 mg total) by mouth daily.  30 tablet  6  . nitroGLYCERIN (NITROSTAT) 0.4 MG SL tablet Place 0.4 mg under the tongue every 5 (five) minutes as needed. For chest pain.       Marland Kitchen omega-3 acid ethyl esters (LOVAZA) 1 G capsule Take 2 capsules (2 g total) by mouth 2 (two) times daily.  120 capsule  3  . omeprazole (PRILOSEC) 40 MG capsule Take 1 capsule (40 mg total) by mouth daily.  30 capsule  11  . pantoprazole (PROTONIX) 40 MG tablet Take 1 tablet by mouth 2 (two) times daily.        . potassium chloride SA (K-DUR,KLOR-CON) 20 MEQ tablet Take 2 tablets (40 mEq total) by mouth daily.  60 tablet  6  . spironolactone (ALDACTONE) 25 MG tablet Take 1 tablet (25 mg total) by mouth daily.  30 tablet  6  . warfarin (COUMADIN) 5 MG tablet Take 5 mg by mouth as directed.       Marland Kitchen DISCONTD: warfarin (COUMADIN) 5 MG tablet Take 1 tablet (5 mg total) by mouth daily. Or as directed.  60 tablet  11    Allergies  Allergen Reactions  . Orange Fruit Anaphylaxis  . Penicillins Anaphylaxis    Family History  Problem Relation Age of Onset  . Diabetes Mother   . Coronary artery disease Mother 19    Died of MI at 49  . Gout Mother   . Heart disease Mother   . Heart attack Mother   . Diabetes Father   . Coronary artery disease Father 79    Died in a house fire   . Heart disease Father   . Heart disease Brother     BP 108/84  Pulse 81  Temp 97.9 F (36.6 C) (Oral)  Resp 16  Wt 269 lb 9 oz (122.273 kg)  SpO2 97%  Review of Systems denies hypoglycemia    Objective:   Physical Exam VITAL SIGNS:  See vs page GENERAL: no distress PSYCH: Alert and oriented x 3.  Does not appear anxious nor depressed.     Assessment & Plan:  DM, needs increased rx

## 2011-11-21 NOTE — Addendum Note (Signed)
Addended by: Levora Angel L on: 11/21/2011 11:59 AM   Modules accepted: Orders

## 2011-11-25 ENCOUNTER — Ambulatory Visit (HOSPITAL_BASED_OUTPATIENT_CLINIC_OR_DEPARTMENT_OTHER): Payer: Medicaid Other | Attending: Cardiology

## 2011-11-25 VITALS — Ht 75.0 in | Wt 255.0 lb

## 2011-11-25 DIAGNOSIS — I5022 Chronic systolic (congestive) heart failure: Secondary | ICD-10-CM

## 2011-11-25 DIAGNOSIS — E785 Hyperlipidemia, unspecified: Secondary | ICD-10-CM

## 2011-11-25 DIAGNOSIS — G4733 Obstructive sleep apnea (adult) (pediatric): Secondary | ICD-10-CM | POA: Insufficient documentation

## 2011-11-25 DIAGNOSIS — I4891 Unspecified atrial fibrillation: Secondary | ICD-10-CM

## 2011-11-25 DIAGNOSIS — I2510 Atherosclerotic heart disease of native coronary artery without angina pectoris: Secondary | ICD-10-CM

## 2011-11-25 DIAGNOSIS — I428 Other cardiomyopathies: Secondary | ICD-10-CM

## 2011-12-03 ENCOUNTER — Encounter: Payer: Self-pay | Admitting: Cardiology

## 2011-12-03 DIAGNOSIS — G4710 Hypersomnia, unspecified: Secondary | ICD-10-CM

## 2011-12-03 DIAGNOSIS — G4730 Sleep apnea, unspecified: Secondary | ICD-10-CM

## 2011-12-03 NOTE — Procedures (Cosign Needed)
NAMEFINDLAY, MUNSEY NO.:  [id number]  MEDICAL RECORD NO.:  [id number]          PATIENT TYPE:  OUT  LOCATION:  SLEEP CENTER                 FACILITY:  [facility]  PHYSICIAN:  Kathee Delton, MD,FCCPDATE OF BIRTH:  09-May-1968  DATE OF STUDY:  11/25/2011                           NOCTURNAL POLYSOMNOGRAM  REFERRING PHYSICIAN:  Loralie Champagne, MD  REFERRING PHYSICIAN:  Loralie Champagne, MD  INDICATION FOR STUDY:  Hypersomnia with sleep apnea.  EPWORTH SLEEPINESS SCORE:  15.  SLEEP ARCHITECTURE:  The patient was found to have a total sleep time of 233 minutes with very little slow-wave sleep or REM.  Sleep onset latency was normal at 17 minutes, and REM onset was normal at 91 minutes.  Sleep efficiency was severely reduced at 65%.  RESPIRATORY DATA:  The patient was found to have 5 apneas and 83 obstructive hypopneas, giving him an apnea/hypopnea index of 23 events per hour.  The events occurred in all body positions and there was loud snoring noted throughout.  OXYGEN DATA:  There was O2 desaturation as low as 87% with the patient's obstructive events.  CARDIAC DATA:  No clinically significant arrhythmias were noted.  MOVEMENT/PARASOMNIA:  The patient had no significant leg jerks or other abnormal behaviors seen.  IMPRESSION/RECOMMENDATION:  Moderate obstructive sleep apnea/hypopnea syndrome with an AHI of 23 events per hour and oxygen desaturation as low as 87%.  Treatment for this degree of sleep apnea can include a trial of weight loss alone, upper airway surgery, dental appliance, and also CPAP.  Clinical correlation is suggested.     Kathee Delton, MD,FCCP Diplomate, [place] Board of Sleep Medicine    [facility]/MED[facility]  D:  12/03/2011 08:13:48  T:  12/03/2011 10:53:46  Job:  [id number]

## 2011-12-04 ENCOUNTER — Ambulatory Visit (INDEPENDENT_AMBULATORY_CARE_PROVIDER_SITE_OTHER): Payer: Medicaid Other | Admitting: Cardiology

## 2011-12-04 ENCOUNTER — Encounter: Payer: Self-pay | Admitting: Cardiology

## 2011-12-04 VITALS — BP 138/90 | HR 96 | Ht 75.0 in | Wt 266.0 lb

## 2011-12-04 DIAGNOSIS — I2510 Atherosclerotic heart disease of native coronary artery without angina pectoris: Secondary | ICD-10-CM

## 2011-12-04 DIAGNOSIS — E785 Hyperlipidemia, unspecified: Secondary | ICD-10-CM

## 2011-12-04 DIAGNOSIS — E781 Pure hyperglyceridemia: Secondary | ICD-10-CM

## 2011-12-04 DIAGNOSIS — E669 Obesity, unspecified: Secondary | ICD-10-CM

## 2011-12-04 DIAGNOSIS — I509 Heart failure, unspecified: Secondary | ICD-10-CM

## 2011-12-04 DIAGNOSIS — E7800 Pure hypercholesterolemia, unspecified: Secondary | ICD-10-CM

## 2011-12-04 DIAGNOSIS — I5022 Chronic systolic (congestive) heart failure: Secondary | ICD-10-CM

## 2011-12-04 DIAGNOSIS — I4891 Unspecified atrial fibrillation: Secondary | ICD-10-CM

## 2011-12-04 MED ORDER — OMEGA-3-ACID ETHYL ESTERS 1 G PO CAPS: 2.0000 g | ORAL_CAPSULE | Freq: Two times a day (BID) | ORAL | Status: DC

## 2011-12-04 NOTE — Patient Instructions (Signed)
Pt advised of sleep study results. He agreed to referral to Dr Gwenette Greet for evaluation and management of sleep apnea.

## 2011-12-04 NOTE — Patient Instructions (Addendum)
Your physician recommends that you return for a FASTING lipid profile/liver profile/BMET. This is scheduled for December 3,2013. Do not eat or drink after midnight.   You have been referred to Dr Gwenette Greet for evaluation and management of sleep apnea.   Your physician recommends that you schedule a follow-up appointment in: 3 months with Dr Aundra Dubin.

## 2011-12-04 NOTE — Addendum Note (Signed)
Addended by: Katrine Coho on: 12/04/2011 09:36 AM   Modules accepted: Orders

## 2011-12-05 NOTE — Progress Notes (Signed)
Patient ID: Corey Palmer, male   DOB: 1970/01/30, 41 y.o.   MRN: [id number] PCP: Dr. Dennard Schaumann  41 yo with history of nonischemic cardiomyopathy and paroxysmal atrial fibrillation presents for cardiology followup after recent admission with acute on chronic systolic CHF.  Patient was admitted in 6/13 with severe dyspnea.  He was diuresed to near-euvolemia.  He was seen by GI and later restarted on coumadin.  Coumadin had been stopped in 3/12 in the setting of an upper GI bleed from PUD.    At last appointment, patient was mildly volume overloaded.  Lasix was increased.  Today he is feeling better from the standpoint of his breathing.  He denies exertional dyspnea and is able to climb steps without problems. No orthopnea.  No chest pain.  He has been having a gout flare in his right knee, ankle, and great toe.  Colchicine is helping.  He was started on Lovaza recently due to very high triglycerides.  Sleep study showed moderate OSA.   Optivol was interrogated today.  No recent threshold crossings.  Thoracic impedance remains high.   Labs (6/13): K 4.4, creatinine 1.0, BNP 2793, digoxin 0.6 Labs (7/13): K 3.3, creatinine 0.9, digoxin 0.5 Labs (8/13): BNP 121 Labs (9/13): K 3.7, creatinine 0.89 Labs (10/13): K 4.1, creatinine 1.0, BNP 164, TGs 537, HDL 27, LDL 116  PMH:  1. Type II diabetes with peripheral neuropathy.  2. Hyperlipidemia 3. Atrial fibrillation: Paroxysmal. He was on dofetilide in the past but was not compliant with it.  He was taken off coumadin with upper GI bleed in 3/12 but is now back on coumadin.  4. Upper GI bleed: From antral ulcers, was in 3/12.  5. Gout 6. Dilated cardiomyopathy: Nonischemic.  [employer] ICD in [id number] (complicated by RA perforation and tamponade).  LHC in 2002 with 40% LAD stenosis. Echo (6/13) with EF 20%, akinetic anteroseptum, moderate MR, severe TR, moderately dilated RV.  7. CAD: LHC 2002 with 40% LAD.  8. OSA  SH: Unemployed truck driver.  Lives [place].  Married.  Nonsmoker.    FH: CHF.   ROS: All systems reviewed and negative except as per HPI.   Current Outpatient Prescriptions  Medication Sig Dispense Refill  . ACCU-CHEK FASTCLIX LANCETS MISC 1 each by Does not apply route 2 (two) times daily.  102 each  5  . amLODipine (NORVASC) 5 MG tablet Take 1 tablet (5 mg total) by mouth daily.  30 tablet  11  . atorvastatin (LIPITOR) 20 MG tablet Take 1 tablet (20 mg total) by mouth daily at 6 PM.  30 tablet  11  . colchicine 0.6 MG tablet Take 0.6 mg by mouth daily as needed. For gout flare ups      . digoxin (LANOXIN) 0.125 MG tablet Take 1 tablet (0.125 mg total) by mouth daily.  30 tablet  6  . furosemide (LASIX) 40 MG tablet Take 1.5 tablets (60 mg total) by mouth 2 (two) times daily.  90 tablet  6  . insulin aspart (NOVOLOG) 100 UNIT/ML injection Inject 45 Units into the skin 3 (three) times daily before meals. 3 times a day (just before each meal), 45-70-50 units, and pen needles 4/day      . insulin glargine (LANTUS) 100 UNIT/ML injection Inject 70 Units into the skin at bedtime.      Marland Kitchen lisinopril (PRINIVIL,ZESTRIL) 40 MG tablet Take 1 tablet (40 mg total) by mouth daily.  30 tablet  11  . magnesium oxide (  MAG-OX 400) 400 MG tablet Take 1 tablet (400 mg total) by mouth daily.  30 tablet  11  . metoprolol (TOPROL XL) 200 MG 24 hr tablet Take 1 tablet (200 mg total) by mouth daily.  30 tablet  6  . Multiple Vitamin CHEW Chew by mouth daily.      . nitroGLYCERIN (NITROSTAT) 0.4 MG SL tablet Place 0.4 mg under the tongue every 5 (five) minutes as needed. For chest pain.       Marland Kitchen omeprazole (PRILOSEC) 40 MG capsule Take 1 capsule (40 mg total) by mouth daily.  30 capsule  11  . pantoprazole (PROTONIX) 40 MG tablet Take 1 tablet by mouth 2 (two) times daily.      . potassium chloride SA (K-DUR,KLOR-CON) 20 MEQ tablet Take 2 tablets (40 mEq total) by mouth daily.  60 tablet  6  . spironolactone (ALDACTONE) 25 MG tablet Take 1 tablet  (25 mg total) by mouth daily.  30 tablet  6  . warfarin (COUMADIN) 5 MG tablet Take 5 mg by mouth as directed. 5 DAYS A WEEK TAKES 10 MG, 2 DAYS TAKES 5MG       . omega-3 acid ethyl esters (LOVAZA) 1 G capsule Take 2 capsules (2 g total) by mouth 2 (two) times daily.      . [DISCONTINUED] warfarin (COUMADIN) 5 MG tablet Take 1 tablet (5 mg total) by mouth daily. Or as directed.  60 tablet  11    BP 138/90  Pulse 96  Ht 6\' 3"  (1.905 m)  Wt 266 lb (120.657 kg)  BMI 33.25 kg/m2 General: NAD Neck: Thick, no JVD, no thyromegaly or thyroid nodule.  Lungs: Clear to auscultation bilaterally with normal respiratory effort. CV: Nondisplaced PMI.  Heart regular S1/S2, no S3/S4, 2/6 murmur LLSB.  Trace ankle edema.  No carotid bruit.  Normal pedal pulses.  Abdomen: Soft, nontender, no hepatosplenomegaly, no distention.  Neurologic: Alert and oriented x 3.  Psych: Normal affect. Extremities: No clubbing or cyanosis.    Assessment/Plan:  Atrial fibrillation  Patient remains in NSR. He is off dofetilide due to noncompliance. I have started him back on coumadin. He had an upper GI bleed from antral ulcers in 3/12. He was seen by GI and was deemed safe to restart coumadin as long as he remains on a PPI. He is on Protonix. Goal INR with coumadin will be 2-2.5. CAD  Nonobstructive. Continue atorvastatin.  Chronic systolic congestive heart failure Stable, NYHA class II symptoms currently. Optivol looks good with high thoracic impedance. He does not appear volume overloaded on exam.  - Continue current doses of digoxin, Lasix, lisinopril, spironolactone, and Toprol XL.  - He will need a CPX in the future.   - Followup in 2 months with BMET/BNP and digoxin level.  OSA Moderate OSA on sleep study.  Awaiting consult with pulmonary to start CPAP.  Hypertriglyceridemia Triglycerides were very high.  He has started on Lovaza.  Repeat lipids in 2 months.    [place] [employer]

## 2011-12-17 ENCOUNTER — Encounter: Payer: Self-pay | Admitting: Endocrinology

## 2011-12-17 ENCOUNTER — Ambulatory Visit (INDEPENDENT_AMBULATORY_CARE_PROVIDER_SITE_OTHER): Payer: Medicaid Other | Admitting: Endocrinology

## 2011-12-17 VITALS — BP 126/80 | HR 90 | Temp 97.7°F | Wt 277.0 lb

## 2011-12-17 DIAGNOSIS — E119 Type 2 diabetes mellitus without complications: Secondary | ICD-10-CM

## 2011-12-17 DIAGNOSIS — Z23 Encounter for immunization: Secondary | ICD-10-CM

## 2011-12-17 NOTE — Patient Instructions (Addendum)
check your blood sugar 2 times a day.  vary the time of day when you check, between before the 3 meals, and at bedtime.  also check if you have symptoms of your blood sugar being too high or too low.  please keep a record of the readings and bring it to your next appointment here.  please call us sooner if your blood sugar goes below 70, or if you have a lot of readings over 200.   Please continue the same insulins for now.   Please come back for a follow-up appointment in January.

## 2011-12-17 NOTE — Progress Notes (Signed)
Subjective:    Patient ID: Corey Palmer, male    DOB: [birth year], 41 y.o.   MRN: [id number]  HPI pt returns for f/u of insulin-requiring DM (dx'ed [id number]; complicated by CAD and peripheral sensory neuropathy). no cbg record, but states cbg's vary from 80-200.  It is lowest before lunch, and highest in am.    Past Medical History  Diagnosis Date  . Dilated cardiomyopathy     status post ICD placement in 2008 c/b tearing at the atrial junction resulting in tamponade and urgent thoracotomy  . Gout, unspecified   . Dyslipidemia   . Hypertension   . Diabetes uncomplicated adult-type II     on insulin therapy, a1c 12.9 in 01/2011  . Chronic systolic heart failure     2D echo (123456) - Systolic function severely reduced., LV EF 20-25%. Akinesis of apical and anteroseptal mycoardium.  last 2D echo (11/2008 ), LV EF 25-30%, diffuse hypokinesis, and grade 1 diastolic dysfunction.  Marland Kitchen CAD (coronary artery disease)     Cardiac cath (08/19/2000) - Nonobstructive, 40% stenosis of LAD,.   . Paroxysmal atrial fibrillation     on chronic coumadin, goal INR 2-3. status post direct current  . Obesity (BMI 30.0-34.9)   . Hepatic steatosis 2011    seen on ultrasound.   . Anginal pain   . Myocardial infarction 2008  . ICD (implantable cardiac defibrillator) in place 2007  . Shortness of breath   . Pacemaker   . CHF (congestive heart failure)   . Cancer 2013    benign stomach cancer  . Peptic ulcer     Past Surgical History  Procedure Date  . Icd placement   . Cardiac pacemaker placement   . Left total knee replacement   . Cholecystectomy   . Esophagogastroduodenoscopy 04/05/2011    Procedure: ESOPHAGOGASTRODUODENOSCOPY (EGD);  Surgeon: Irene Shipper, MD;  Location: [facility] END[facility]COPY;  Service: Endoscopy;  Laterality: N/A;  . Joint replacement     left knee replacement  . Cardiac catheterization     History   Social History  . Marital Status: Married    Spouse Name: N/A    Number of Children: 3    . Years of Education: college   Occupational History  . bar tender    Social History Main Topics  . Smoking status: Never Smoker   . Smokeless tobacco: Never Used  . Alcohol Use: No  . Drug Use: No  . Sexually Active: Not on file   Other Topics Concern  . Not on file   Social History Narrative   Lives in [place].Studied sports medicine in college, got a BS - used to work with the [employer].    Current Outpatient Prescriptions on File Prior to Visit  Medication Sig Dispense Refill  . ACCU-CHEK FASTCLIX LANCETS MISC 1 each by Does not apply route 2 (two) times daily.  102 each  5  . amLODipine (NORVASC) 5 MG tablet Take 1 tablet (5 mg total) by mouth daily.  30 tablet  11  . atorvastatin (LIPITOR) 20 MG tablet Take 1 tablet (20 mg total) by mouth daily at 6 PM.  30 tablet  11  . colchicine 0.6 MG tablet Take 0.6 mg by mouth daily as needed. For gout flare ups      . digoxin (LANOXIN) 0.125 MG tablet Take 1 tablet (0.125 mg total) by mouth daily.  30 tablet  6  . furosemide (LASIX) 40 MG tablet Take 1.5 tablets (60 mg total) by  mouth 2 (two) times daily.  90 tablet  6  . insulin aspart (NOVOLOG) 100 UNIT/ML injection Inject 45 Units into the skin 3 (three) times daily before meals. 3 times a day (just before each meal), 45-70-50 units, and pen needles 4/day      . insulin glargine (LANTUS) 100 UNIT/ML injection Inject 70 Units into the skin at bedtime.      Marland Kitchen lisinopril (PRINIVIL,ZESTRIL) 40 MG tablet Take 1 tablet (40 mg total) by mouth daily.  30 tablet  11  . magnesium oxide (MAG-OX 400) 400 MG tablet Take 1 tablet (400 mg total) by mouth daily.  30 tablet  11  . metoprolol (TOPROL XL) 200 MG 24 hr tablet Take 1 tablet (200 mg total) by mouth daily.  30 tablet  6  . Multiple Vitamin CHEW Chew by mouth daily.      . nitroGLYCERIN (NITROSTAT) 0.4 MG SL tablet Place 0.4 mg under the tongue every 5 (five) minutes as needed. For chest pain.       Marland Kitchen omega-3 acid ethyl esters (LOVAZA)  1 G capsule Take 2 capsules (2 g total) by mouth 2 (two) times daily.      Marland Kitchen omeprazole (PRILOSEC) 40 MG capsule Take 1 capsule (40 mg total) by mouth daily.  30 capsule  11  . pantoprazole (PROTONIX) 40 MG tablet Take 1 tablet by mouth 2 (two) times daily.      . potassium chloride SA (K-DUR,KLOR-CON) 20 MEQ tablet Take 2 tablets (40 mEq total) by mouth daily.  60 tablet  6  . spironolactone (ALDACTONE) 25 MG tablet Take 1 tablet (25 mg total) by mouth daily.  30 tablet  6  . warfarin (COUMADIN) 5 MG tablet Take 5 mg by mouth as directed. 5 DAYS A WEEK TAKES 10 MG, 2 DAYS TAKES 5MG       . [DISCONTINUED] warfarin (COUMADIN) 5 MG tablet Take 1 tablet (5 mg total) by mouth daily. Or as directed.  60 tablet  11    Allergies  Allergen Reactions  . Orange Fruit Anaphylaxis  . Penicillins Anaphylaxis    Family History  Problem Relation Age of Onset  . Diabetes Mother   . Coronary artery disease Mother 57    Died of MI at 24  . Gout Mother   . Heart disease Mother   . Heart attack Mother   . Diabetes Father   . Coronary artery disease Father 40    Died in a house fire   . Heart disease Father   . Heart disease Brother    BP 126/80  Pulse 90  Temp 97.7 F (36.5 C) (Oral)  Wt 277 lb (125.646 kg)  SpO2 95%  Review of Systems denies hypoglycemia.      Objective:   Physical Exam VITAL SIGNS:  See vs page GENERAL: no distress Pulses: dorsalis pedis intact bilat.   Feet: no deformity.  no ulcer on the feet.  feet are of normal color and temp.  no edema.  There is spotty hyperpigmentation on the legs. Neuro: sensation is intact to touch on the feet.     Assessment & Plan:  DM, with much better control.

## 2011-12-23 ENCOUNTER — Institutional Professional Consult (permissible substitution): Payer: Medicaid Other | Admitting: Pulmonary Disease

## 2011-12-31 ENCOUNTER — Other Ambulatory Visit: Payer: Medicaid Other

## 2012-02-03 ENCOUNTER — Ambulatory Visit: Payer: Medicaid Other | Admitting: Endocrinology

## 2012-02-06 ENCOUNTER — Encounter: Payer: Self-pay | Admitting: Internal Medicine

## 2012-03-04 ENCOUNTER — Ambulatory Visit: Payer: Medicaid Other | Admitting: Cardiology

## 2012-04-24 ENCOUNTER — Encounter: Payer: Self-pay | Admitting: Pharmacist

## 2012-05-13 ENCOUNTER — Telehealth: Payer: Self-pay | Admitting: Family Medicine

## 2012-05-13 ENCOUNTER — Telehealth: Payer: Self-pay | Admitting: Pharmacist

## 2012-05-13 MED ORDER — COLCHICINE 0.6 MG PO TABS: 0.6000 mg | ORAL_TABLET | Freq: Every day | ORAL | Status: DC | PRN

## 2012-05-13 NOTE — Telephone Encounter (Signed)
LM with patient.  He is delinquent for Coumadin follow up.  Mailed letter in March.  Will mail second letter.

## 2012-05-13 NOTE — Telephone Encounter (Signed)
Rx Refilled  

## 2012-07-23 ENCOUNTER — Encounter: Payer: Self-pay | Admitting: *Deleted

## 2012-08-06 ENCOUNTER — Other Ambulatory Visit: Payer: Self-pay

## 2012-11-17 ENCOUNTER — Encounter: Payer: Self-pay | Admitting: Internal Medicine

## 2012-11-17 ENCOUNTER — Telehealth: Payer: Self-pay | Admitting: Internal Medicine

## 2012-11-17 NOTE — Telephone Encounter (Signed)
11-17-12 SENT PAST DUE LETTER, LAST CHECK 10/2011 WITH DEVICE DUE WITH [facility]/MT

## 2013-02-17 ENCOUNTER — Telehealth: Payer: Self-pay | Admitting: Family Medicine

## 2013-02-17 ENCOUNTER — Other Ambulatory Visit: Payer: Self-pay | Admitting: Family Medicine

## 2013-02-17 NOTE — Telephone Encounter (Signed)
Pharmacy [facility] Pt wife is calling because her husband is incarcerated and he is needing a refill on his gout medication Call back number is [phone]

## 2013-02-19 NOTE — Telephone Encounter (Signed)
Dr. Dennard Schaumann denied   Wife aware

## 2013-03-22 ENCOUNTER — Other Ambulatory Visit: Payer: Medicaid Other

## 2013-03-22 ENCOUNTER — Ambulatory Visit: Payer: Medicaid Other | Admitting: Nurse Practitioner

## 2013-03-24 ENCOUNTER — Ambulatory Visit: Payer: Medicaid Other | Admitting: Endocrinology

## 2013-03-24 ENCOUNTER — Encounter: Payer: Self-pay | Admitting: Endocrinology

## 2013-03-24 ENCOUNTER — Ambulatory Visit (INDEPENDENT_AMBULATORY_CARE_PROVIDER_SITE_OTHER): Payer: Medicaid Other | Admitting: Endocrinology

## 2013-03-24 ENCOUNTER — Other Ambulatory Visit: Payer: Self-pay

## 2013-03-24 VITALS — BP 132/90 | HR 91 | Temp 98.2°F | Ht 75.0 in | Wt 252.0 lb

## 2013-03-24 DIAGNOSIS — E108 Type 1 diabetes mellitus with unspecified complications: Secondary | ICD-10-CM

## 2013-03-24 DIAGNOSIS — E119 Type 2 diabetes mellitus without complications: Secondary | ICD-10-CM

## 2013-03-24 LAB — BASIC METABOLIC PANEL
BUN: 13 mg/dL (ref 6–23)
CALCIUM: 8.6 mg/dL (ref 8.4–10.5)
CO2: 24 mEq/L (ref 19–32)
Chloride: 104 mEq/L (ref 96–112)
Creatinine, Ser: 0.9 mg/dL (ref 0.4–1.5)
GFR: 93.72 mL/min (ref >60.00)
Glucose, Bld: 348 mg/dL — ABNORMAL HIGH (ref 70–99)
Potassium: 3.7 mEq/L (ref 3.5–5.1)
SODIUM: 134 meq/L — AB (ref 135–145)

## 2013-03-24 LAB — HEMOGLOBIN A1C: HEMOGLOBIN A1C: 8.7 % — AB (ref 4.6–6.5)

## 2013-03-24 MED ORDER — METFORMIN HCL ER 500 MG PO TB24: ORAL_TABLET | ORAL | Status: DC

## 2013-03-24 MED ORDER — GLIMEPIRIDE 4 MG PO TABS: 4.0000 mg | ORAL_TABLET | Freq: Every day | ORAL | Status: DC

## 2013-03-24 MED ORDER — GLUCOSE BLOOD VI STRP: ORAL_STRIP | Status: DC

## 2013-03-24 MED ORDER — GLUCOSE BLOOD VI STRP: 1.0000 | ORAL_STRIP | Freq: Two times a day (BID) | Status: DC

## 2013-03-24 MED ORDER — PIOGLITAZONE HCL 45 MG PO TABS: 45.0000 mg | ORAL_TABLET | Freq: Every day | ORAL | Status: DC

## 2013-03-24 MED ORDER — ACCU-CHEK AVIVA DEVI: 1.0000 | Freq: Once | Status: DC

## 2013-03-24 MED ORDER — ONETOUCH ULTRA MINI W/DEVICE KIT: PACK | Status: DC

## 2013-03-24 NOTE — Progress Notes (Signed)
Subjective:    Patient ID: Corey Palmer, male    DOB: 1970-07-21, 43 y.o.   MRN: [id number]  HPI pt returns for f/u of insulin-requiring DM (dx'ed 2010; he has mild neuropathy of the lower extremities, and associated CAD; he has been on insulin since 2011; he has never had severe hypoglycemia or DKA). He was incarcerated x 15 months (ending 2 weeks ago).  While there, he lost down to 188 lbs.  He says a1c was 6.2% on glipizide and NPH, 40 units am and 20 pm.  However, he says he took the insulin only qod, due to hypoglycemia.  He has since regained weight.  He does not have a cbg meter.  Past Medical History  Diagnosis Date  . Dilated cardiomyopathy     status post ICD placement in 2008 c/b tearing at the atrial junction resulting in tamponade and urgent thoracotomy  . Gout, unspecified   . Dyslipidemia   . Hypertension   . Diabetes uncomplicated adult-type II     on insulin therapy, a1c 12.9 in 01/2011  . Chronic systolic heart failure     2D echo (123456) - Systolic function severely reduced., LV EF 20-25%. Akinesis of apical and anteroseptal mycoardium.  last 2D echo (11/2008 ), LV EF 25-30%, diffuse hypokinesis, and grade 1 diastolic dysfunction.  Marland Kitchen CAD (coronary artery disease)     Cardiac cath (08/19/2000) - Nonobstructive, 40% stenosis of LAD,.   . Paroxysmal atrial fibrillation     on chronic coumadin, goal INR 2-3. status post direct current  . Obesity (BMI 30.0-34.9)   . Hepatic steatosis 2011    seen on ultrasound.   . Anginal pain   . Myocardial infarction 2008  . ICD (implantable cardiac defibrillator) in place 2007  . Shortness of breath   . Pacemaker   . CHF (congestive heart failure)   . Cancer 2013    benign stomach cancer  . Peptic ulcer     Past Surgical History  Procedure Laterality Date  . Icd placement    . Cardiac pacemaker placement    . Left total knee replacement    . Cholecystectomy    . Esophagogastroduodenoscopy  04/05/2011    Procedure:  ESOPHAGOGASTRODUODENOSCOPY (EGD);  Surgeon: Irene Shipper, MD;  Location: [facility] ENDOSCOPY;  Service: Endoscopy;  Laterality: N/A;  . Joint replacement      left knee replacement  . Cardiac catheterization      History   Social History  . Marital Status: Married    Spouse Name: N/A    Number of Children: 3  . Years of Education: college   Occupational History  . bar tender    Social History Main Topics  . Smoking status: Never Smoker   . Smokeless tobacco: Never Used  . Alcohol Use: No  . Drug Use: No  . Sexual Activity: Not on file   Other Topics Concern  . Not on file   Social History Narrative   Lives in [place].   Studied sports medicine in college, got a BS - used to work with the [employer].          Current Outpatient Prescriptions on File Prior to Visit  Medication Sig Dispense Refill  . ACCU-CHEK FASTCLIX LANCETS MISC 1 each by Does not apply route 2 (two) times daily.  102 each  5  . amLODipine (NORVASC) 5 MG tablet Take 1 tablet (5 mg total) by mouth daily.  30 tablet  11  . atorvastatin (LIPITOR)  20 MG tablet Take 1 tablet (20 mg total) by mouth daily at 6 PM.  30 tablet  11  . colchicine 0.6 MG tablet Take 1 tablet (0.6 mg total) by mouth daily as needed.  30 tablet  3  . digoxin (LANOXIN) 0.125 MG tablet Take 1 tablet (0.125 mg total) by mouth daily.  30 tablet  6  . furosemide (LASIX) 40 MG tablet Take 1.5 tablets (60 mg total) by mouth 2 (two) times daily.  90 tablet  6  . lisinopril (PRINIVIL,ZESTRIL) 40 MG tablet Take 1 tablet (40 mg total) by mouth daily.  30 tablet  11  . metoprolol (TOPROL XL) 200 MG 24 hr tablet Take 1 tablet (200 mg total) by mouth daily.  30 tablet  6  . Multiple Vitamin CHEW Chew by mouth daily.      . nitroGLYCERIN (NITROSTAT) 0.4 MG SL tablet Place 0.4 mg under the tongue every 5 (five) minutes as needed. For chest pain.       Marland Kitchen omega-3 acid ethyl esters (LOVAZA) 1 G capsule Take 2 capsules (2 g total) by mouth 2 (two) times daily.       Marland Kitchen omeprazole (PRILOSEC) 40 MG capsule Take 1 capsule (40 mg total) by mouth daily.  30 capsule  11  . pantoprazole (PROTONIX) 40 MG tablet Take 1 tablet by mouth 2 (two) times daily.      . potassium chloride SA (K-DUR,KLOR-CON) 20 MEQ tablet Take 2 tablets (40 mEq total) by mouth daily.  60 tablet  6  . spironolactone (ALDACTONE) 25 MG tablet Take 1 tablet (25 mg total) by mouth daily.  30 tablet  6  . warfarin (COUMADIN) 5 MG tablet Take 5 mg by mouth as directed. 5 DAYS A WEEK TAKES 10 MG, 2 DAYS TAKES 5MG        No current facility-administered medications on file prior to visit.    Allergies  Allergen Reactions  . Orange Fruit Anaphylaxis  . Penicillins Anaphylaxis    Family History  Problem Relation Age of Onset  . Diabetes Mother   . Coronary artery disease Mother 59    Died of MI at 58  . Gout Mother   . Heart disease Mother   . Heart attack Mother   . Diabetes Father   . Coronary artery disease Father 28    Died in a house fire   . Heart disease Father   . Heart disease Brother     BP 132/90  Pulse 91  Temp(Src) 98.2 F (36.8 C) (Oral)  Ht 6\' 3"  (1.905 m)  Wt 252 lb (114.306 kg)  BMI 31.50 kg/m2  SpO2 96%  Review of Systems Denies numbness and sob.    Objective:   Physical Exam VITAL SIGNS:  See vs page GENERAL: no distress.  Lab Results  Component Value Date   HGBA1C 8.7* 03/24/2013      Assessment & Plan:  DM: he may be manageable with orals.  He needs increased rx. Legal circumstances: these limit f/u of DM. Hypertriglyceridemia: actos will help.

## 2013-03-24 NOTE — Patient Instructions (Addendum)
check your blood sugar 4 times a day.  vary the time of day when you check, between before the 3 meals, and at bedtime.  also check if you have symptoms of your blood sugar being too high or too low.  please keep a record of the readings and bring it to your next appointment here.  You can write it on any piece of paper.  please call us sooner if your blood sugar goes below 70, or if you have a lot of readings over 200.  i have sent prescriptions to your pharmacy, for a new meter and strips.  blood tests are being requested for you today.  We'll contact you with results. Based on the results, you may need either several pills, or to resume the novolog.  i'll let you know.

## 2013-03-29 ENCOUNTER — Ambulatory Visit (INDEPENDENT_AMBULATORY_CARE_PROVIDER_SITE_OTHER): Payer: Medicaid Other | Admitting: *Deleted

## 2013-03-29 ENCOUNTER — Emergency Department (INDEPENDENT_AMBULATORY_CARE_PROVIDER_SITE_OTHER): Payer: Medicaid Other

## 2013-03-29 ENCOUNTER — Emergency Department (INDEPENDENT_AMBULATORY_CARE_PROVIDER_SITE_OTHER)
Admission: EM | Admit: 2013-03-29 | Discharge: 2013-03-29 | Disposition: A | Discharge status: Home / Self Care | Payer: Medicaid Other | Source: Home / Self Care | Attending: Family Medicine | Admitting: Family Medicine

## 2013-03-29 ENCOUNTER — Encounter (HOSPITAL_COMMUNITY): Payer: Self-pay | Admitting: Emergency Medicine

## 2013-03-29 DIAGNOSIS — E785 Hyperlipidemia, unspecified: Secondary | ICD-10-CM

## 2013-03-29 DIAGNOSIS — E781 Pure hyperglyceridemia: Secondary | ICD-10-CM

## 2013-03-29 DIAGNOSIS — I10 Essential (primary) hypertension: Secondary | ICD-10-CM

## 2013-03-29 DIAGNOSIS — R9431 Abnormal electrocardiogram [ECG] [EKG]: Secondary | ICD-10-CM

## 2013-03-29 DIAGNOSIS — M25539 Pain in unspecified wrist: Secondary | ICD-10-CM

## 2013-03-29 DIAGNOSIS — W19XXXA Unspecified fall, initial encounter: Secondary | ICD-10-CM

## 2013-03-29 DIAGNOSIS — I5022 Chronic systolic (congestive) heart failure: Secondary | ICD-10-CM

## 2013-03-29 DIAGNOSIS — M25579 Pain in unspecified ankle and joints of unspecified foot: Secondary | ICD-10-CM

## 2013-03-29 DIAGNOSIS — I2510 Atherosclerotic heart disease of native coronary artery without angina pectoris: Secondary | ICD-10-CM

## 2013-03-29 DIAGNOSIS — M25569 Pain in unspecified knee: Secondary | ICD-10-CM

## 2013-03-29 DIAGNOSIS — Z7901 Long term (current) use of anticoagulants: Secondary | ICD-10-CM

## 2013-03-29 DIAGNOSIS — Z5181 Encounter for therapeutic drug level monitoring: Secondary | ICD-10-CM

## 2013-03-29 DIAGNOSIS — I4891 Unspecified atrial fibrillation: Secondary | ICD-10-CM

## 2013-03-29 DIAGNOSIS — I428 Other cardiomyopathies: Secondary | ICD-10-CM

## 2013-03-29 DIAGNOSIS — I509 Heart failure, unspecified: Secondary | ICD-10-CM

## 2013-03-29 DIAGNOSIS — I319 Disease of pericardium, unspecified: Secondary | ICD-10-CM

## 2013-03-29 LAB — BASIC METABOLIC PANEL
BUN: 15 mg/dL (ref 6–23)
CHLORIDE: 105 meq/L (ref 96–112)
CO2: 29 meq/L (ref 19–32)
Calcium: 8.5 mg/dL (ref 8.4–10.5)
Creatinine, Ser: 1 mg/dL (ref 0.4–1.5)
GFR: 86.19 mL/min (ref >60.00)
Glucose, Bld: 203 mg/dL — ABNORMAL HIGH (ref 70–99)
POTASSIUM: 4 meq/L (ref 3.5–5.1)
SODIUM: 138 meq/L (ref 135–145)

## 2013-03-29 LAB — MDC_IDC_ENUM_SESS_TYPE_INCLINIC
Brady Statistic RV Percent Paced: 0.01 %
Date Time Interrogation Session: 20150302181111
HighPow Impedance: 55 Ohm
Lead Channel Pacing Threshold Amplitude: 0.875 V
Lead Channel Pacing Threshold Pulse Width: 0.4 ms
Lead Channel Sensing Intrinsic Amplitude: 18.5 mV
Lead Channel Setting Pacing Amplitude: 2.5 V
Lead Channel Setting Sensing Sensitivity: 0.3 mV
MDC IDC MSMT BATTERY VOLTAGE: 3.04 V
MDC IDC MSMT LEADCHNL RV IMPEDANCE VALUE: 475 Ohm
MDC IDC MSMT LEADCHNL RV SENSING INTR AMPL: 16 mV
MDC IDC SET LEADCHNL RV PACING PULSEWIDTH: 0.4 ms
MDC IDC SET ZONE DETECTION INTERVAL: 250 ms
MDC IDC SET ZONE DETECTION INTERVAL: 360 ms
Zone Setting Detection Interval: 430 ms

## 2013-03-29 LAB — POCT INR: INR: 1

## 2013-03-29 LAB — BRAIN NATRIURETIC PEPTIDE: Pro B Natriuretic peptide (BNP): 43 pg/mL (ref 0.0–100.0)

## 2013-03-29 LAB — LIPID PANEL
CHOL/HDL RATIO: 6
Cholesterol: 183 mg/dL (ref 0–200)
HDL: 31.3 mg/dL — AB (ref >39.00)
LDL CALC: 74 mg/dL (ref 0–99)
TRIGLYCERIDES: 391 mg/dL — AB (ref 0.0–149.0)
VLDL: 78.2 mg/dL — ABNORMAL HIGH (ref 0.0–40.0)

## 2013-03-29 LAB — HEPATIC FUNCTION PANEL
ALBUMIN: 3.3 g/dL — AB (ref 3.5–5.2)
ALT: 46 U/L (ref 0–53)
AST: 26 U/L (ref 0–37)
Alkaline Phosphatase: 63 U/L (ref 39–117)
Bilirubin, Direct: 0 mg/dL (ref 0.0–0.3)
TOTAL PROTEIN: 6.2 g/dL (ref 6.0–8.3)
Total Bilirubin: 0.7 mg/dL (ref 0.3–1.2)

## 2013-03-29 MED ORDER — ATORVASTATIN CALCIUM 20 MG PO TABS: 20.0000 mg | ORAL_TABLET | Freq: Every day | ORAL | Status: DC

## 2013-03-29 MED ORDER — METOPROLOL SUCCINATE ER 200 MG PO TB24: 200.0000 mg | ORAL_TABLET | Freq: Every day | ORAL | Status: DC

## 2013-03-29 MED ORDER — LISINOPRIL 40 MG PO TABS: 40.0000 mg | ORAL_TABLET | Freq: Every day | ORAL | Status: DC

## 2013-03-29 MED ORDER — AMLODIPINE BESYLATE 5 MG PO TABS: 5.0000 mg | ORAL_TABLET | Freq: Every day | ORAL | Status: DC

## 2013-03-29 MED ORDER — POTASSIUM CHLORIDE CRYS ER 20 MEQ PO TBCR: 40.0000 meq | EXTENDED_RELEASE_TABLET | Freq: Every day | ORAL | Status: DC

## 2013-03-29 MED ORDER — DIGOXIN 125 MCG PO TABS: 0.1250 mg | ORAL_TABLET | Freq: Every day | ORAL | Status: DC

## 2013-03-29 MED ORDER — OMEPRAZOLE 40 MG PO CPDR: 40.0000 mg | DELAYED_RELEASE_CAPSULE | Freq: Every day | ORAL | Status: DC

## 2013-03-29 MED ORDER — NITROGLYCERIN 0.4 MG SL SUBL: 0.4000 mg | SUBLINGUAL_TABLET | SUBLINGUAL | Status: DC | PRN

## 2013-03-29 MED ORDER — PREDNISONE 10 MG PO KIT: PACK | ORAL | Status: DC

## 2013-03-29 MED ORDER — PANTOPRAZOLE SODIUM 40 MG PO TBEC: 40.0000 mg | DELAYED_RELEASE_TABLET | Freq: Two times a day (BID) | ORAL | Status: DC

## 2013-03-29 MED ORDER — TRAMADOL HCL 50 MG PO TABS: 50.0000 mg | ORAL_TABLET | Freq: Four times a day (QID) | ORAL | Status: DC | PRN

## 2013-03-29 MED ORDER — SPIRONOLACTONE 25 MG PO TABS: 25.0000 mg | ORAL_TABLET | Freq: Every day | ORAL | Status: DC

## 2013-03-29 MED ORDER — WARFARIN SODIUM 5 MG PO TABS: ORAL_TABLET | ORAL | Status: DC

## 2013-03-29 MED ORDER — FUROSEMIDE 40 MG PO TABS: 60.0000 mg | ORAL_TABLET | Freq: Two times a day (BID) | ORAL | Status: DC

## 2013-03-29 NOTE — ED Notes (Signed)
Pt  Reports  He  Slipped  On the  Ice  And  [employer]  3  Days  Ago  He  Takes   Coumadin       He ambulated  To  Treatment  Room          He  Reports   Pain   In l  Ankle       l  Wrist  And  r knee      No  Obvious deformity  Noted

## 2013-03-29 NOTE — Discharge Instructions (Signed)
Meniscus Tear with Phase I Rehab The meniscus is a C-shaped cartilage structure, located in the knee joint between the thigh bone (femur) and the shinbone (tibia). Two menisci are located in each knee joint: the inner and outer meniscus. The meniscus acts as an adapter between the thigh bone and shinbone, allowing them to fit properly together. It also functions as a shock absorber, to reduce the stress placed on the knee joint and to help supply nutrients to the knee joint cartilage. As people age, the meniscus begins to harden and become more vulnerable to injury. Meniscus tears are a common injury, especially in older athletes. Inner meniscus tears are more common than outer meniscus tears.  SYMPTOMS   Pain in the knee, especially with standing or squatting with the affected leg.  Tenderness along the joint line.  Swelling in the knee joint (effusion), usually starting 1 to 2 days after injury.  Locking or catching of the knee joint, causing inability to straighten the knee completely.  Giving way or buckling of the knee. CAUSES  A meniscus tear occurs when a force is placed on the meniscus that is greater than it can handle. Common causes of injury include:  Direct hit (trauma) to the knee.  Twisting, pivoting, or cutting (rapidly changing direction while running), kneeling or squatting.  Without injury, due to aging. RISK INCREASES WITH:  Contact sports (football, rugby).  Sports in which cleats are used with pivoting (soccer, lacrosse) or sports in which good shoe grip and sudden change in direction are required (racquetball, basketball, squash).  Previous knee injury.  Associated knee injury, particularly ligament injuries.  Poor strength and flexibility. PREVENTION  Warm up and stretch properly before activity.  Maintain physical fitness:  Strength, flexibility, and endurance.  Cardiovascular fitness.  Protect the knee with a brace or elastic bandage.  Wear  properly fitted protective equipment (proper cleats for the surface). PROGNOSIS  Sometimes, meniscus tears heal on their own. However, definitive treatment requires surgery, followed by at least 6 weeks of recovery.  RELATED COMPLICATIONS   Recurring symptoms that result in a chronic problem.  Repeated knee injury, especially if sports are resumed too soon after injury or surgery.  Progression of the tear (the tear gets larger), if untreated.  Arthritis of the knee in later years (with or without surgery).  Complications of surgery, including infection, bleeding, injury to nerves (numbness, weakness, paralysis) continued pain, giving way, locking, nonhealing of meniscus (if repaired), need for further surgery, and knee stiffness (loss of motion). TREATMENT  Treatment first involves the use of ice and medicine, to reduce pain and inflammation. You may find using crutches to walk more comfortable. However, it is okay to bear weight on the injured knee, if the pain will allow it. Surgery is often advised as a definitive treatment. Surgery is performed through an incision near the joint (arthroscopically). The torn piece of the meniscus is removed, and if possible the joint cartilage is repaired. After surgery, the joint must be restrained. After restraint, it is important to perform strengthening and stretching exercises to help regain strength and a full range of motion. These exercises may be completed at home or with a therapist.  MEDICATION  If pain medicine is needed, nonsteroidal anti-inflammatory medicines (aspirin and ibuprofen), or other minor pain relievers (acetaminophen), are often advised.  Do not take pain medicine for 7 days before surgery.  Prescription pain relievers may be given, if your caregiver thinks they are needed. Use only as directed and  only as much as you need. HEAT AND COLD  Cold treatment (icing) should be applied for 10 to 15 minutes every 2 to 3 hours for  inflammation and pain, and immediately after activity that aggravates your symptoms. Use ice packs or an ice massage.  Heat treatment may be used before performing stretching and strengthening activities prescribed by your caregiver, physical therapist, or athletic trainer. Use a heat pack or a warm water soak. SEEK MEDICAL CARE IF:   Symptoms get worse or do not improve in 2 weeks, despite treatment.  New, unexplained symptoms develop. (Drugs used in treatment may produce side effects.) EXERCISES RANGE OF MOTION (ROM) AND STRETCHING EXERCISES - Meniscus Tear, Non-operative, Phase I These are some of the initial exercises with which you may start your rehabilitation program, until you see your caregiver again or until your symptoms are resolved. Remember:   These initial exercises are intended to be gentle. They will help you restore motion without increasing any swelling.  Completing these exercises allows less painful movement and prepares you for the more aggressive strengthening exercises in Phase II.  An effective stretch should be held for at least 30 seconds.  A stretch should never be painful. You should only feel a gentle lengthening or release in the stretched tissue. RANGE OF MOTION - Knee Flexion, Active  Lie on your back with both knees straight. (If this causes back discomfort, bend your healthy knee, placing your foot flat on the floor.)  Slowly slide your heel back toward your buttocks until you feel a gentle stretch in the front of your knee or thigh.  Hold for __________ seconds. Slowly slide your heel back to the starting position. Repeat __________ times. Complete this exercise __________ times per day.  RANGE OF MOTION - Knee Flexion and Extension, Active-Assisted  Sit on the edge of a table or chair with your thighs firmly supported. It may be helpful to place a folded towel under the end of your right / left thigh.  Flexion (bending): Place the ankle of your  healthy leg on top of the other ankle. Use your healthy leg to gently bend your right / left knee until you feel a mild tension across the top of your knee.  Hold for __________ seconds.  Extension (straightening): Switch your ankles so your right / left leg is on top. Use your healthy leg to straighten your right / left knee until you feel a mild tension on the backside of your knee.  Hold for __________ seconds. Repeat __________ times. Complete __________ times per day. STRETCH - Knee Flexion, Supine  Lie on the floor with your right / left heel and foot lightly touching the wall. (Place both feet on the wall if you do not use a door frame.)  Without using any effort, allow gravity to slide your foot down the wall slowly until you feel a gentle stretch in the front of your right / left knee.  Hold this stretch for __________ seconds. Then return the leg to the starting position, using your healthy leg for help, if needed. Repeat __________ times. Complete this stretch __________ times per day.  STRETCH - Knee Extension Sitting  Sit with your right / left leg/heel propped on another chair, coffee table, or foot stool.  Allow your leg muscles to relax, letting gravity straighten out your knee.*  You should feel a stretch behind your right / left knee. Hold this position for __________ seconds. Repeat __________ times. Complete this stretch __________  times per day.  *Your physician, physical therapist or athletic trainer may instruct you place a __________ weight on your thigh, just above your kneecap, to deepen the stretch.  STRENGTHENING EXERCISES - Meniscus Tear, Non-operative, Phase I These exercises may help you when beginning to rehabilitate your injury. They may resolve your symptoms with or without further involvement from your physician, physical therapist or athletic trainer. While completing these exercises, remember:   Muscles can gain both the endurance and the strength  needed for everyday activities through controlled exercises.  Complete these exercises as instructed by your physician, physical therapist or athletic trainer. Progress the resistance and repetitions only as guided. STRENGTH - Quadriceps, Isometrics  Lie on your back with your right / left leg extended and your opposite knee bent.  Gradually tense the muscles in the front of your right / left thigh. You should see either your knee cap slide up toward your hip or increased dimpling just above the knee. This motion will push the back of the knee down toward the floor, mat, or bed on which you are lying.  Hold the muscle as tight as you can, without increasing your pain, for __________ seconds.  Relax the muscles slowly and completely between each repetition. Repeat __________ times. Complete this exercise __________ times per day.  STRENGTH - Quadriceps, Short Arcs   Lie on your back. Place a __________ inch towel roll under your right / left knee, so that the knee bends slightly.  Raise only your lower leg by tightening the muscles in the front of your thigh. Do not allow your thigh to rise.  Hold this position for __________ seconds. Repeat __________ times. Complete this exercise __________ times per day.  OPTIONAL ANKLE WEIGHTS: Begin with ____________________, but DO NOT exceed ____________________. Increase in 1 pound/0.5 kilogram increments. STRENGTH - Quadriceps, Straight Leg Raises  Quality counts! Watch for signs that the quadriceps muscle is working, to be sure you are strengthening the correct muscles and not "cheating" by substituting with healthier muscles.  Lay on your back with your right / left leg extended and your opposite knee bent.  Tense the muscles in the front of your right / left thigh. You should see either your knee cap slide up or increased dimpling just above the knee. Your thigh may even shake a bit.  Tighten these muscles even more and raise your leg 4 to 6  inches off the floor. Hold for __________ seconds.  Keeping these muscles tense, lower your leg.  Relax the muscles slowly and completely in between each repetition. Repeat __________ times. Complete this exercise __________ times per day.  STRENGTH - Hamstring, Curls   Lay on your stomach with your legs extended. (If you lay on a bed, your feet may hang over the edge.)  Tighten the muscles in the back of your thigh to bend your right / left knee up to 90 degrees. Keep your hips flat on the bed.  Hold this position for __________ seconds.  Slowly lower your leg back to the starting position. Repeat __________ times. Complete this exercise __________ times per day.  STRENGTH  Quadriceps, Squats  Stand in a door frame so that your feet and knees are in line with the frame.  Use your hands for balance, not support, on the frame.  Slowly lower your weight, bending at the hips and knees. Keep your lower legs upright so that they are parallel with the door frame. Squat only within the range that does  not increase your knee pain. Never let your hips drop below your knees.  Slowly return upright, pushing with your legs, not pulling with your hands. Repeat __________ times. Complete this exercise __________ times per day.  STRENGTH - Quad/VMO, Isometric   Sit in a chair with your right / left knee slightly bent. With your fingertips, feel the VMO muscle just above the inside of your knee. The VMO is important in controlling the position of your kneecap.  Keeping your fingertips on this muscle. Without actually moving your leg, attempt to drive your knee down as if straightening your leg. You should feel your VMO tense. If you have a difficult time, you may wish to try the same exercise on your healthy knee first.  Tense this muscle as hard as you can without increasing any knee pain.  Hold for __________ seconds. Relax the muscles slowly and completely in between each repetition. Repeat  __________ times. Complete exercise __________ times per day.  Document Released: 01/28/1998 Document Revised: 04/08/2011 Document Reviewed: 04/28/2008 [facility] Patient Information 2014 [place], [place].  Wear and Tear Disorders of the Knee (Arthritis, Osteoarthritis) Everyone will experience wear and tear injuries (arthritis, osteoarthritis) of the knee. These are the changes we all get as we age. They come from the joint stress of daily living. The amount of cartilage damage in your knee and your symptoms determine if you need surgery. Mild problems require approximately two months recovery time. More severe problems take several months to recover. With mild problems, your surgeon may find worn and rough cartilage surfaces. With severe changes, your surgeon may find cartilage that has completely worn away and exposed the bone. Loose bodies of bone and cartilage, bone spurs (excess bone growth), and injuries to the menisci (cushions between the large bones of your leg) are also common. All of these problems can cause pain. For a mild wear and tear problem, rough cartilage may simply need to be shaved and smoothed. For more severe problems with areas of exposed bone, your surgeon may use an instrument for roughing up the bone surfaces to stimulate new cartilage growth. Loose bodies are usually removed. Torn menisci may be trimmed or repaired. ABOUT THE ARTHROSCOPIC PROCEDURE Arthroscopy is a surgical technique. It allows your orthopedic surgeon to diagnose and treat your knee injury with accuracy. The surgeon looks into your knee through a small scope. The scope is like a small (pencil-sized) telescope. Arthroscopy is less invasive than open knee surgery. You can expect a more rapid recovery. After the procedure, you will be moved to a recovery area until most of the effects of the medication have worn off. Your caregiver will discuss the test results with you. RECOVERY The severity of the arthritis and the  type of procedure performed will determine recovery time. Other important factors include age, physical condition, medical conditions, and the type of rehabilitation program. Strengthening your muscles after arthroscopy helps guarantee a better recovery. Follow your caregiver's instructions. Use crutches, rest, elevate, ice, and do knee exercises as instructed. Your caregivers will help you and instruct you with exercises and other physical therapy required to regain your mobility, muscle strength, and functioning following surgery. Only take over-the-counter or prescription medicines for pain, discomfort, or fever as directed by your caregiver.  SEEK MEDICAL CARE IF:   There is increased bleeding (more than a small spot) from the wound.  You notice redness, swelling, or increasing pain in the wound.  Pus is coming from wound.  You develop an unexplained  oral temperature above 102 F (38.9 C) , or as your caregiver suggests.  You notice a foul smell coming from the wound or dressing.  You have severe pain with motion of the knee. SEEK IMMEDIATE MEDICAL CARE IF:   You develop a rash.  You have difficulty breathing.  You have any allergic problems. MAKE SURE YOU:   Understand these instructions.  Will watch your condition.  Will get help right away if you are not doing well or get worse. Document Released: 01/12/2000 Document Revised: 04/08/2011 Document Reviewed: 06/10/2007 [facility] Patient Information 2014 [place], [place].

## 2013-03-29 NOTE — ED Provider Notes (Signed)
Medical screening examination/treatment/procedure(s) were performed by resident physician or non-physician practitioner and as supervising physician I was immediately available for consultation/collaboration.   KINDL,JAMES DOUGLAS MD.   James D Kindl, MD 03/29/13 1834 

## 2013-03-29 NOTE — ED Provider Notes (Signed)
CSN: [id number]     Arrival date & time 03/29/13  1356 History   First MD Initiated Contact with Patient 03/29/13 1526     Chief Complaint  Patient presents with  . Fall   (Consider location/radiation/quality/duration/timing/severity/associated sxs/prior Treatment) HPI Comments: 43 year old male presents for evaluation of pain in his left wrist, left ankle, and right knee after slipping and falling on the ice 3 days ago. He has been taking Tylenol as needed for the pain which is not helping significantly. The pain is getting slightly better. He is most concerned about mild crepitus in the right knee with any movement. He is able to walk, but with significant pain. He had some swelling in the joints that has resolved.  Patient is a 43 y.o. male presenting with fall.  Fall Pertinent negatives include no chest pain, no abdominal pain and no shortness of breath.    Past Medical History  Diagnosis Date  . Dilated cardiomyopathy     status post ICD placement in 2008 c/b tearing at the atrial junction resulting in tamponade and urgent thoracotomy  . Gout, unspecified   . Dyslipidemia   . Hypertension   . Diabetes uncomplicated adult-type II     on insulin therapy, a1c 12.9 in 01/2011  . Chronic systolic heart failure     2D echo (36/6440) - Systolic function severely reduced., LV EF 20-25%. Akinesis of apical and anteroseptal mycoardium.  last 2D echo (11/2008 ), LV EF 25-30%, diffuse hypokinesis, and grade 1 diastolic dysfunction.  Marland Kitchen CAD (coronary artery disease)     Cardiac cath (08/19/2000) - Nonobstructive, 40% stenosis of LAD,.   . Paroxysmal atrial fibrillation     on chronic coumadin, goal INR 2-3. status post direct current  . Obesity (BMI 30.0-34.9)   . Hepatic steatosis 2011    seen on ultrasound.   . Anginal pain   . Myocardial infarction 2008  . ICD (implantable cardiac defibrillator) in place 2007  . Shortness of breath   . Pacemaker   . CHF (congestive heart failure)   .  Cancer 2013    benign stomach cancer  . Peptic ulcer    Past Surgical History  Procedure Laterality Date  . Icd placement    . Cardiac pacemaker placement    . Left total knee replacement    . Cholecystectomy    . Esophagogastroduodenoscopy  04/05/2011    Procedure: ESOPHAGOGASTRODUODENOSCOPY (EGD);  Surgeon: Irene Shipper, MD;  Location: [name] ENDOSCOPY;  Service: Endoscopy;  Laterality: N/A;  . Joint replacement      left knee replacement  . Cardiac catheterization     Family History  Problem Relation Age of Onset  . Diabetes Mother   . Coronary artery disease Mother 26    Died of MI at 25  . Gout Mother   . Heart disease Mother   . Heart attack Mother   . Diabetes Father   . Coronary artery disease Father 22    Died in a house fire   . Heart disease Father   . Heart disease Brother    History  Substance Use Topics  . Smoking status: Never Smoker   . Smokeless tobacco: Never Used  . Alcohol Use: No    Review of Systems  Constitutional: Negative for fever, chills and fatigue.  HENT: Negative for sore throat.   Eyes: Negative for visual disturbance.  Respiratory: Negative for cough and shortness of breath.   Cardiovascular: Negative for chest pain, palpitations and leg swelling.  Gastrointestinal: Negative for nausea, vomiting, abdominal pain, diarrhea and constipation.  Genitourinary: Negative for dysuria, urgency, frequency and hematuria.  Musculoskeletal: Positive for arthralgias and joint swelling. Negative for myalgias, neck pain and neck stiffness.       See history of present illness  Skin: Negative for rash.  Neurological: Negative for dizziness, weakness and light-headedness.    Allergies  Orange fruit and Penicillins  Home Medications   Current Outpatient Rx  Name  Route  Sig  Dispense  Refill  . ACCU-CHEK FASTCLIX LANCETS MISC   Does not apply   1 each by Does not apply route 2 (two) times daily.   102 each   5   . amLODipine (NORVASC) 5 MG  tablet   Oral   Take 1 tablet (5 mg total) by mouth daily.   30 tablet   0   . atorvastatin (LIPITOR) 20 MG tablet   Oral   Take 1 tablet (20 mg total) by mouth daily at 6 PM.   30 tablet   0   . Blood Glucose Monitoring Suppl (ONE TOUCH ULTRA MINI) W/DEVICE KIT      1 each by other route once   1 each   0   . colchicine 0.6 MG tablet   Oral   Take 1 tablet (0.6 mg total) by mouth daily as needed.   30 tablet   3   . digoxin (LANOXIN) 0.125 MG tablet   Oral   Take 1 tablet (0.125 mg total) by mouth daily.   30 tablet   0   . furosemide (LASIX) 40 MG tablet   Oral   Take 1.5 tablets (60 mg total) by mouth 2 (two) times daily.   90 tablet   0   . glimepiride (AMARYL) 4 MG tablet   Oral   Take 1 tablet (4 mg total) by mouth daily before breakfast.   30 tablet   3   . glucose blood (ONE TOUCH ULTRA TEST) test strip      Use as instructed   100 each   12     1 each by other route 2 times daily.   Marland Kitchen lisinopril (PRINIVIL,ZESTRIL) 40 MG tablet   Oral   Take 1 tablet (40 mg total) by mouth daily.   30 tablet   0   . metFORMIN (GLUCOPHAGE XR) 500 MG 24 hr tablet      2 tabs, twice a day   120 tablet   11   . metoprolol (TOPROL XL) 200 MG 24 hr tablet   Oral   Take 1 tablet (200 mg total) by mouth daily.   30 tablet   0   . Multiple Vitamin CHEW   Oral   Chew by mouth daily.         . nitroGLYCERIN (NITROSTAT) 0.4 MG SL tablet   Sublingual   Place 1 tablet (0.4 mg total) under the tongue every 5 (five) minutes as needed. For chest pain.   25 tablet   0   . omega-3 acid ethyl esters (LOVAZA) 1 G capsule   Oral   Take 2 capsules (2 g total) by mouth 2 (two) times daily.         Marland Kitchen omeprazole (PRILOSEC) 40 MG capsule   Oral   Take 1 capsule (40 mg total) by mouth daily.   30 capsule   0   . pantoprazole (PROTONIX) 40 MG tablet   Oral   Take 1 tablet (40 mg  total) by mouth 2 (two) times daily.   60 tablet   0   . pioglitazone (ACTOS)  45 MG tablet   Oral   Take 1 tablet (45 mg total) by mouth daily.   30 tablet   11   . potassium chloride SA (K-DUR,KLOR-CON) 20 MEQ tablet   Oral   Take 2 tablets (40 mEq total) by mouth daily.   60 tablet   0   . PredniSONE 10 MG KIT      12 day taper dose pack. Use as directed   48 each   0   . spironolactone (ALDACTONE) 25 MG tablet   Oral   Take 1 tablet (25 mg total) by mouth daily.   30 tablet   0   . traMADol (ULTRAM) 50 MG tablet   Oral   Take 1 tablet (50 mg total) by mouth every 6 (six) hours as needed.   20 tablet   0   . warfarin (COUMADIN) 5 MG tablet      Take as directed by coumadin clinic   60 tablet   0    BP 165/110  Pulse 91  Temp(Src) 98.3 F (36.8 C) (Oral)  Resp 17  SpO2 95% Physical Exam  Nursing note and vitals reviewed. Constitutional: He is oriented to person, place, and time. He appears well-developed and well-nourished. No distress.  HENT:  Head: Normocephalic.  Pulmonary/Chest: Effort normal. No respiratory distress.  Musculoskeletal:       Left wrist: He exhibits decreased range of motion and tenderness (diffuse). He exhibits no bony tenderness, no swelling, no effusion, no crepitus and no deformity.       Right knee: He exhibits decreased range of motion and abnormal meniscus (pain with medial rotation of the foot with McMurrays click test ). He exhibits no swelling, no effusion, no ecchymosis and no deformity. Tenderness (diffuse) found.       Left ankle: He exhibits decreased range of motion. He exhibits no swelling and no ecchymosis. Tenderness. Lateral malleolus, AITFL and head of 5th metatarsal tenderness found. No medial malleolus, no CF ligament and no proximal fibula tenderness found. Achilles tendon normal.       Left foot: He exhibits no swelling.  Right knee anterior cruciate ligament laxity, positive anterior drawer   Neurological: He is alert and oriented to person, place, and time. Coordination normal.  Skin: Skin  is warm and dry. No rash noted. He is not diaphoretic.  Psychiatric: He has a normal mood and affect. Judgment normal.    ED Course  Procedures (including critical care time) Labs Review Labs Reviewed - No data to display Imaging Review Dg Wrist Complete Left  03/29/2013   CLINICAL DATA:  Pain post trauma  EXAM: LEFT WRIST - COMPLETE 3+ VIEW  COMPARISON:  None.  FINDINGS: Frontal, oblique, lateral, and ulnar deviation scaphoid images were obtained. There is no fracture or dislocation. Joint spaces appear intact. No erosive change. There is extensive arterial vascular calcification in the radial artery region.  IMPRESSION: Arterial vascular calcification.  No fracture or dislocation.   Electronically Signed   By: Lowella Grip M.D.   On: 03/29/2013 16:24   Dg Ankle Complete Left  03/29/2013   CLINICAL DATA:  Fall  EXAM: LEFT ANKLE COMPLETE - 3+ VIEW  COMPARISON:  None.  FINDINGS: No acute fracture.  No dislocation.  Degenerative changes are noted.  IMPRESSION: No acute bony pathology.   Electronically Signed   By: Duffy Rhody.D.  On: 03/29/2013 16:36   Dg Knee Complete 4 Views Right  03/29/2013   CLINICAL DATA:  Fall and right knee pain.  EXAM: RIGHT KNEE - COMPLETE 4+ VIEW  COMPARISON:  None.  FINDINGS: There is a small suprapatellar joint effusion. Mild to moderate osteoarthritic changes, particularly in the medial knee compartment. Negative for a fracture or dislocation.  IMPRESSION: Mild to moderate osteoarthritis in the right knee.  No acute bone abnormality.  Small suprapatellar joint effusion.   Electronically Signed   By: Markus Daft M.D.   On: 03/29/2013 16:48   Dg Foot Complete Left  03/29/2013   CLINICAL DATA:  History of fall complaining of left foot pain.  EXAM: LEFT FOOT - COMPLETE 3+ VIEW  COMPARISON:  No priors.  FINDINGS: Three views of the left foot demonstrate no acute displaced fracture, subluxation, dislocation, joint or soft tissue abnormality. Os tibiale externum (accessory  ossicle) incidentally noted.  IMPRESSION: No acute radiographic abnormality of the left foot.   Electronically Signed   By: Vinnie Langton M.D.   On: 03/29/2013 16:36     MDM   1. Fall   2. Knee pain   3. Wrist pain   4. Ankle pain    X-rays all negative. His physical exam indicates possible meniscal injury of the right medial meniscus, and also minimal anterior cruciate ligament ligamentous laxity. We'll treat symptomatically for a few days, if he is not began to have any improvement in one week he will followup with orthopedics. He has a history of diabetes mellitus his last A1c was 6.2 and that he can control his blood pressure, he would like to take prednisone if it would help. His x-ray indicates arthritis, so I believe prednisone may be of some benefit.   Meds ordered this encounter  Medications  . PredniSONE 10 MG KIT    Sig: 12 day taper dose pack. Use as directed    Dispense:  48 each    Refill:  0    Order Specific Question:  Supervising Provider    Answer:  Lynne Leader, [place]  . traMADol (ULTRAM) 50 MG tablet    Sig: Take 1 tablet (50 mg total) by mouth every 6 (six) hours as needed.    Dispense:  20 tablet    Refill:  0    Order Specific Question:  Supervising Provider    Answer:  Lynne Leader, [place]       Liam Graham, PA-C 03/29/13 [phone]

## 2013-03-30 ENCOUNTER — Telehealth: Payer: Self-pay | Admitting: *Deleted

## 2013-03-30 LAB — DIGOXIN LEVEL: Digoxin Level: <0.1 ng/mL — ABNORMAL LOW (ref 0.8–2.0)

## 2013-03-30 NOTE — Telephone Encounter (Signed)
Left message for pt to call as he was placed  on prednisone dose pak  yesterday and need to reschedule his appt

## 2013-04-01 ENCOUNTER — Encounter: Payer: Self-pay | Admitting: Internal Medicine

## 2013-04-01 NOTE — Progress Notes (Signed)
ICD check in clinic. Normal device function. Threshold and sensing consistent with previous device measurements. Thoracic impedance is stable at present, but did show abn readings from roughly May 2014-Jan 2015 (during incarceration). Patient is currently with c/o ankle edema since running out of meds on 2-12. Meds were refilled today for 30 day supply only. Impedance trends stable over time. 21 "NSVT" episodes since 11-18-2011---max dur. 9 mins, Max V 176---episodes were during periods of activity---?ST. Histogram distribution appropriate for patient and level of activity. No changes made this session. Device programmed at appropriate safety margins. Device programmed to optimize intrinsic conduction. Batt voltage 3.04V (ERI 2.63V). Pt enrolled in remote follow-up. Plan to follow up with [facility] in 3 months. Patient education completed including shock plan. Alert tones demonstrated for patient.

## 2013-04-02 ENCOUNTER — Ambulatory Visit (INDEPENDENT_AMBULATORY_CARE_PROVIDER_SITE_OTHER): Payer: Medicaid Other | Admitting: *Deleted

## 2013-04-02 DIAGNOSIS — I4891 Unspecified atrial fibrillation: Secondary | ICD-10-CM

## 2013-04-02 DIAGNOSIS — Z7901 Long term (current) use of anticoagulants: Secondary | ICD-10-CM

## 2013-04-02 DIAGNOSIS — Z5181 Encounter for therapeutic drug level monitoring: Secondary | ICD-10-CM

## 2013-04-02 LAB — POCT INR: INR: 1

## 2013-04-05 ENCOUNTER — Encounter: Payer: Self-pay | Admitting: Internal Medicine

## 2013-04-13 ENCOUNTER — Ambulatory Visit (INDEPENDENT_AMBULATORY_CARE_PROVIDER_SITE_OTHER): Payer: Medicaid Other | Admitting: Pharmacist

## 2013-04-13 DIAGNOSIS — Z5181 Encounter for therapeutic drug level monitoring: Secondary | ICD-10-CM

## 2013-04-13 DIAGNOSIS — Z7901 Long term (current) use of anticoagulants: Secondary | ICD-10-CM

## 2013-04-13 DIAGNOSIS — I4891 Unspecified atrial fibrillation: Secondary | ICD-10-CM

## 2013-04-13 LAB — POCT INR: INR: 1.4

## 2013-04-21 ENCOUNTER — Ambulatory Visit (INDEPENDENT_AMBULATORY_CARE_PROVIDER_SITE_OTHER): Payer: Medicaid Other | Admitting: Pharmacist

## 2013-04-21 DIAGNOSIS — I4891 Unspecified atrial fibrillation: Secondary | ICD-10-CM

## 2013-04-21 DIAGNOSIS — Z7901 Long term (current) use of anticoagulants: Secondary | ICD-10-CM

## 2013-04-21 DIAGNOSIS — Z5181 Encounter for therapeutic drug level monitoring: Secondary | ICD-10-CM

## 2013-04-21 LAB — POCT INR: INR: 1.1

## 2013-05-05 ENCOUNTER — Encounter: Payer: Self-pay | Admitting: Physician Assistant

## 2013-05-05 ENCOUNTER — Ambulatory Visit (INDEPENDENT_AMBULATORY_CARE_PROVIDER_SITE_OTHER): Payer: Medicaid Other | Admitting: Physician Assistant

## 2013-05-05 VITALS — BP 138/82 | HR 77 | Ht 75.0 in | Wt 256.8 lb

## 2013-05-05 DIAGNOSIS — I2510 Atherosclerotic heart disease of native coronary artery without angina pectoris: Secondary | ICD-10-CM

## 2013-05-05 DIAGNOSIS — I5022 Chronic systolic (congestive) heart failure: Secondary | ICD-10-CM

## 2013-05-05 DIAGNOSIS — I509 Heart failure, unspecified: Secondary | ICD-10-CM

## 2013-05-05 DIAGNOSIS — I428 Other cardiomyopathies: Secondary | ICD-10-CM

## 2013-05-05 DIAGNOSIS — E7800 Pure hypercholesterolemia, unspecified: Secondary | ICD-10-CM

## 2013-05-05 DIAGNOSIS — E785 Hyperlipidemia, unspecified: Secondary | ICD-10-CM

## 2013-05-05 DIAGNOSIS — I4891 Unspecified atrial fibrillation: Secondary | ICD-10-CM

## 2013-05-05 MED ORDER — AMLODIPINE BESYLATE 5 MG PO TABS: 5.0000 mg | ORAL_TABLET | Freq: Every day | ORAL | Status: DC

## 2013-05-05 MED ORDER — DIGOXIN 125 MCG PO TABS: 0.1250 mg | ORAL_TABLET | Freq: Every day | ORAL | Status: DC

## 2013-05-05 MED ORDER — ATORVASTATIN CALCIUM 20 MG PO TABS: 20.0000 mg | ORAL_TABLET | Freq: Every day | ORAL | Status: DC

## 2013-05-05 MED ORDER — LISINOPRIL 40 MG PO TABS: 40.0000 mg | ORAL_TABLET | Freq: Every day | ORAL | Status: DC

## 2013-05-05 MED ORDER — POTASSIUM CHLORIDE CRYS ER 20 MEQ PO TBCR: 40.0000 meq | EXTENDED_RELEASE_TABLET | Freq: Every day | ORAL | Status: DC

## 2013-05-05 MED ORDER — OMEGA-3-ACID ETHYL ESTERS 1 G PO CAPS: 2.0000 g | ORAL_CAPSULE | Freq: Two times a day (BID) | ORAL | Status: DC

## 2013-05-05 MED ORDER — SPIRONOLACTONE 25 MG PO TABS: 25.0000 mg | ORAL_TABLET | Freq: Every day | ORAL | Status: DC

## 2013-05-05 MED ORDER — FUROSEMIDE 40 MG PO TABS
60.0000 mg | ORAL_TABLET | Freq: Two times a day (BID) | ORAL | Status: DC
Start: 2013-05-05 — End: 2017-10-21

## 2013-05-05 MED ORDER — NITROGLYCERIN 0.4 MG SL SUBL: 0.4000 mg | SUBLINGUAL_TABLET | SUBLINGUAL | Status: DC | PRN

## 2013-05-05 MED ORDER — METOPROLOL SUCCINATE ER 200 MG PO TB24: 200.0000 mg | ORAL_TABLET | Freq: Every day | ORAL | Status: DC

## 2013-05-05 NOTE — Patient Instructions (Addendum)
REFILLS HAVE BEEN SENT IN FOR YOUR AMLODIPINE, LASIX, LISINOPRIL, TOPROL, DIGOXIN, NTG, POTASSIUM, SPIRONOLACTONE, LOVAZA, LIPITOR   LAB WORK TO BE DONE IN 1-2 WEEKS FOR A BMET AND DIGOXIN LEVEL; DO NOT TAKE DIGOXIN THE MORNING OF LAB WORK ; YOU CAN TAKE IT AFTER THE LAB WORK IS DONE  Your physician has requested that you have an echocardiogram. Echocardiography is a painless test that uses sound waves to create images of your heart. It provides your doctor with information about the size and shape of your heart and how well your heart's chambers and valves are working. This procedure takes approximately one hour. There are no restrictions for this procedure.   PLEASE FOLLOW UP WITH DR. Aundra Dubin IN ABOUT 6-8 WEEKS

## 2013-05-05 NOTE — Progress Notes (Signed)
[place], [place], [place]  [address] Phone: [phone] Fax:  [phone]  Date:  05/05/2013   ID:  Corey Palmer, DOB 04/09/1970, MRN [id number]  PCP:  Odette Fraction, MD  Cardiologist:  Dr. Loralie Champagne     History of Present Illness: Corey Palmer is a 43 y.o. male with a hx of non-obstructive CAD, NICM, systolic CHF, parox AFib, prior UGI bleed in setting of PUD (03/2010), DM, HL, s/p AICD.  Last seen by Dr. Loralie Champagne in 11/2011.  Patient was cleared to resume coumadin in 2012.  He was taken off of Dofetilide in the past due to non-compliance.  Of note, he has not had a therapeutic INR since 08/2011.  He was released from jail in 02/2013.  The patient tells me that he was not given his medications for quite some time while in prison. He did have an episode where his ICD shocked him. He was not evaluated in the hospital. His medications were resumed at that time. Since release from jail, his INR has been sub-therapeutic. Our Coumadin clinic has been adjusting his Coumadin. The patient overall is doing well. He denies chest pain, dyspnea, orthopnea, PND or edema. He denies syncope. He is NYHA class II.  Recent Labs: 03/29/2013: ALT 46; Creatinine 1.0; HDL Cholesterol 31.30*; LDL (calc) 74; Potassium 4.0; Pro B Natriuretic peptide (BNP) 43.0 03/29/2013: Digoxin Level <0.1* 04/21/2013: POC INR 1.1   Wt Readings from Last 3 Encounters:  03/24/13 252 lb (114.306 kg)  12/17/11 277 lb (125.646 kg)  12/04/11 266 lb (120.657 kg)     Past Medical History: 1. Type II diabetes with peripheral neuropathy.  2. Hyperlipidemia  3. Atrial fibrillation: Paroxysmal. He was on dofetilide in the past but was not compliant with it. He was taken off coumadin with upper GI bleed in 3/12 but is now back on coumadin.  4. Upper GI bleed: From antral ulcers, was in 3/12.  5. Gout  6. Dilated cardiomyopathy: Nonischemic. Medtronic ICD in 5027 (complicated by RA perforation and tamponade).  LHC in 2002 with 40% LAD stenosis. Echo (6/13) with EF 20%, akinetic anteroseptum, moderate MR, severe TR, moderately dilated RV.  7. CAD: LHC 2002 with 40% LAD.  8. OSA   Past Medical History  Diagnosis Date  . Dilated cardiomyopathy     status post ICD placement in 2008 c/b tearing at the atrial junction resulting in tamponade and urgent thoracotomy  . Gout, unspecified   . Dyslipidemia   . Hypertension   . Diabetes uncomplicated adult-type II     on insulin therapy, a1c 12.9 in 01/2011  . Chronic systolic heart failure     2D echo (74/1287) - Systolic function severely reduced., LV EF 20-25%. Akinesis of apical and anteroseptal mycoardium.  last 2D echo (11/2008 ), LV EF 25-30%, diffuse hypokinesis, and grade 1 diastolic dysfunction.  Marland Kitchen CAD (coronary artery disease)     Cardiac cath (08/19/2000) - Nonobstructive, 40% stenosis of LAD,.   . Paroxysmal atrial fibrillation     on chronic coumadin, goal INR 2-3. status post direct current  . Obesity (BMI 30.0-34.9)   . Hepatic steatosis 2011    seen on ultrasound.   . Anginal pain   . Myocardial infarction 2008  . ICD (implantable cardiac defibrillator) in place 2007  . Shortness of breath   . Pacemaker   . CHF (congestive heart failure)   . Cancer 2013    benign stomach cancer  . Peptic  ulcer     Current Outpatient Prescriptions  Medication Sig Dispense Refill  . ACCU-CHEK FASTCLIX LANCETS MISC 1 each by Does not apply route 2 (two) times daily.  102 each  5  . amLODipine (NORVASC) 5 MG tablet Take 1 tablet (5 mg total) by mouth daily.  30 tablet  0  . atorvastatin (LIPITOR) 20 MG tablet Take 1 tablet (20 mg total) by mouth daily at 6 PM.  30 tablet  0  . Blood Glucose Monitoring Suppl (ONE TOUCH ULTRA MINI) W/DEVICE KIT 1 each by other route once  1 each  0  . colchicine 0.6 MG tablet Take 1 tablet (0.6 mg total) by mouth daily as needed.  30 tablet  3  . digoxin (LANOXIN) 0.125 MG tablet Take 1 tablet (0.125 mg total) by  mouth daily.  30 tablet  0  . divalproex (DEPAKOTE) 500 MG DR tablet Take 500 mg by mouth 2 (two) times daily.      Marland Kitchen FLUoxetine (PROZAC) 20 MG capsule Take 20 mg by mouth 2 (two) times daily.      . furosemide (LASIX) 40 MG tablet Take 1.5 tablets (60 mg total) by mouth 2 (two) times daily.  90 tablet  0  . glimepiride (AMARYL) 4 MG tablet Take 1 tablet (4 mg total) by mouth daily before breakfast.  30 tablet  3  . glucose blood (ONE TOUCH ULTRA TEST) test strip Use as instructed  100 each  12  . lisinopril (PRINIVIL,ZESTRIL) 40 MG tablet Take 1 tablet (40 mg total) by mouth daily.  30 tablet  0  . metFORMIN (GLUCOPHAGE XR) 500 MG 24 hr tablet 2 tabs, twice a day  120 tablet  11  . metoprolol (TOPROL XL) 200 MG 24 hr tablet Take 1 tablet (200 mg total) by mouth daily.  30 tablet  0  . Multiple Vitamin CHEW Chew by mouth daily.      . nitroGLYCERIN (NITROSTAT) 0.4 MG SL tablet Place 1 tablet (0.4 mg total) under the tongue every 5 (five) minutes as needed. For chest pain.  25 tablet  0  . omega-3 acid ethyl esters (LOVAZA) 1 G capsule Take 2 capsules (2 g total) by mouth 2 (two) times daily.      Marland Kitchen omeprazole (PRILOSEC) 40 MG capsule Take 1 capsule (40 mg total) by mouth daily.  30 capsule  0  . pantoprazole (PROTONIX) 40 MG tablet Take 1 tablet (40 mg total) by mouth 2 (two) times daily.  60 tablet  0  . pioglitazone (ACTOS) 45 MG tablet Take 1 tablet (45 mg total) by mouth daily.  30 tablet  11  . potassium chloride SA (K-DUR,KLOR-CON) 20 MEQ tablet Take 2 tablets (40 mEq total) by mouth daily.  60 tablet  0  . PredniSONE 10 MG KIT 12 day taper dose pack. Use as directed  48 each  0  . spironolactone (ALDACTONE) 25 MG tablet Take 1 tablet (25 mg total) by mouth daily.  30 tablet  0  . traMADol (ULTRAM) 50 MG tablet Take 1 tablet (50 mg total) by mouth every 6 (six) hours as needed.  20 tablet  0  . warfarin (COUMADIN) 5 MG tablet Take as directed by coumadin clinic  60 tablet  0   No current  facility-administered medications for this visit.    Allergies:   Orange fruit and Penicillins   Social History:  The patient  reports that he has never smoked. He has never used smokeless  tobacco. He reports that he does not drink alcohol or use illicit drugs.   Family History:  [name] family history includes Coronary artery disease (age of onset: 79) in his father and mother; Diabetes in his father and mother; Gout in his mother; Heart attack in his mother; Heart disease in his brother, father, and mother.   ROS:  Please see the history of present illness.      All other systems reviewed and negative.   PHYSICAL EXAM: VS:  BP 138/82  Pulse 77  Ht '6\' 3"'  (1.905 m)  Wt 256 lb 12.8 oz (116.484 kg)  BMI 32.10 kg/m2 Well nourished, well developed, in no acute distress HEENT: normal Neck: no JVD Cardiac:  normal S1, S2; RRR; no murmur Lungs:  clear to auscultation bilaterally, no wheezing, rhonchi or rales Abd: soft, nontender, no hepatomegaly Ext: no edema Skin: warm and dry Neuro:  CNs 2-12 intact, no focal abnormalities noted  EKG:  NSR, HR 77, normal axis, T-wave inversions in 1, aVL, V5 and V6, no change from prior tracing     ASSESSMENT AND PLAN:  1. Atrial Fibrillation: He is maintaining NSR. INRs have remained sub-therapeutic since release from prison. I did review his case today with Dr. Aundra Dubin after the patient left the office. We feel that he would likely benefit from changing to Eliquis.  I will ask the Coumadin clinic to arrange this at his next follow up. 2. Chronic Systolic CHF: Volume stable. He is now back on all his medications. He is NYHA class II. 3. Nonischemic Cardiomyopathy: Arrange follow up echocardiogram to reassess LV function. Continue current dose of digoxin, ACE inhibitor, beta blocker, spironolactone. Arrange follow up basic metabolic panel and digoxin level. 4. Status Post ICD: Patient did report ICD shock about a year ago. This was in the setting  of no medications. His medicines were resumed at that time and he has had no further therapies. 5. CAD: Nonobstructive. Continue statin. 6. Hyperlipidemia: Continue statin. 7. Disposition: Follow up with Dr. Aundra Dubin in 6-8 weeks in the CHF clinic.  Signed, Richardson Dopp, PA-C  05/05/2013 9:54 AM

## 2013-05-06 ENCOUNTER — Telehealth: Payer: Self-pay | Admitting: *Deleted

## 2013-05-06 ENCOUNTER — Telehealth: Payer: Self-pay | Admitting: Pharmacist

## 2013-05-06 DIAGNOSIS — I4891 Unspecified atrial fibrillation: Secondary | ICD-10-CM

## 2013-05-06 DIAGNOSIS — I428 Other cardiomyopathies: Secondary | ICD-10-CM

## 2013-05-06 DIAGNOSIS — I2510 Atherosclerotic heart disease of native coronary artery without angina pectoris: Secondary | ICD-10-CM

## 2013-05-06 DIAGNOSIS — E785 Hyperlipidemia, unspecified: Secondary | ICD-10-CM

## 2013-05-06 DIAGNOSIS — I5022 Chronic systolic (congestive) heart failure: Secondary | ICD-10-CM

## 2013-05-06 MED ORDER — METOPROLOL SUCCINATE ER 200 MG PO TB24: 200.0000 mg | ORAL_TABLET | Freq: Every day | ORAL | Status: DC

## 2013-05-06 NOTE — Telephone Encounter (Signed)
Patient missed his 04/28/13 protime follow up appointment.  Corey Palmer and Dr. Aundra Dubin want patient to be transitioned to Eliquis.  We need to call patient and get him set up for coumadin clinic to get an INR and determine if he can change to Eliquis.

## 2013-05-06 NOTE — Telephone Encounter (Signed)
Patient called and will come in 05/11/13 to get INR and discuss transitioning to Eliquis.  Appointment made.

## 2013-05-06 NOTE — Telephone Encounter (Signed)
Medicaid requires TOPROL XL... brand name not metoprolol generic for their preferred formulary

## 2013-05-11 ENCOUNTER — Ambulatory Visit (INDEPENDENT_AMBULATORY_CARE_PROVIDER_SITE_OTHER): Payer: Medicaid Other | Admitting: *Deleted

## 2013-05-11 DIAGNOSIS — I4891 Unspecified atrial fibrillation: Secondary | ICD-10-CM

## 2013-05-11 DIAGNOSIS — Z5181 Encounter for therapeutic drug level monitoring: Secondary | ICD-10-CM

## 2013-05-11 DIAGNOSIS — Z7901 Long term (current) use of anticoagulants: Secondary | ICD-10-CM

## 2013-05-11 LAB — POCT INR: INR: 1.3

## 2013-05-11 MED ORDER — APIXABAN 5 MG PO TABS: 5.0000 mg | ORAL_TABLET | Freq: Two times a day (BID) | ORAL | Status: DC

## 2013-05-13 ENCOUNTER — Ambulatory Visit (INDEPENDENT_AMBULATORY_CARE_PROVIDER_SITE_OTHER): Payer: Medicaid Other | Admitting: Family Medicine

## 2013-05-13 ENCOUNTER — Encounter: Payer: Self-pay | Admitting: Family Medicine

## 2013-05-13 VITALS — BP 142/96 | HR 96 | Temp 98.0°F | Resp 18 | Ht 75.0 in | Wt 263.0 lb

## 2013-05-13 DIAGNOSIS — E1165 Type 2 diabetes mellitus with hyperglycemia: Principal | ICD-10-CM

## 2013-05-13 DIAGNOSIS — Z01818 Encounter for other preprocedural examination: Secondary | ICD-10-CM

## 2013-05-13 DIAGNOSIS — I509 Heart failure, unspecified: Secondary | ICD-10-CM

## 2013-05-13 DIAGNOSIS — IMO0001 Reserved for inherently not codable concepts without codable children: Secondary | ICD-10-CM

## 2013-05-13 LAB — CBC WITH DIFFERENTIAL/PLATELET
BASOS ABS: 0 10*3/uL (ref 0.0–0.1)
Basophils Relative: 0 % (ref 0–1)
Eosinophils Absolute: 0.2 10*3/uL (ref 0.0–0.7)
Eosinophils Relative: 3 % (ref 0–5)
HCT: 43.9 % (ref 39.0–52.0)
Hemoglobin: 14.9 g/dL (ref 13.0–17.0)
LYMPHS ABS: 1.6 10*3/uL (ref 0.7–4.0)
LYMPHS PCT: 24 % (ref 12–46)
MCH: 29 pg (ref 26.0–34.0)
MCHC: 33.9 g/dL (ref 30.0–36.0)
MCV: 85.6 fL (ref 78.0–100.0)
Monocytes Absolute: 0.5 10*3/uL (ref 0.1–1.0)
Monocytes Relative: 8 % (ref 3–12)
NEUTROS ABS: 4.2 10*3/uL (ref 1.7–7.7)
NEUTROS PCT: 65 % (ref 43–77)
PLATELETS: 242 10*3/uL (ref 150–400)
RBC: 5.13 MIL/uL (ref 4.22–5.81)
RDW: 14.1 % (ref 11.5–15.5)
WBC: 6.5 10*3/uL (ref 4.0–10.5)

## 2013-05-14 ENCOUNTER — Other Ambulatory Visit: Payer: Self-pay | Admitting: Internal Medicine

## 2013-05-14 ENCOUNTER — Encounter: Payer: Self-pay | Admitting: Family Medicine

## 2013-05-14 LAB — COMPLETE METABOLIC PANEL WITH GFR
ALBUMIN: 3.9 g/dL (ref 3.5–5.2)
ALT: 24 U/L (ref 0–53)
AST: 16 U/L (ref 0–37)
Alkaline Phosphatase: 93 U/L (ref 39–117)
BUN: 21 mg/dL (ref 6–23)
CALCIUM: 9 mg/dL (ref 8.4–10.5)
CO2: 27 meq/L (ref 19–32)
CREATININE: 1.13 mg/dL (ref 0.50–1.35)
Chloride: 94 mEq/L — ABNORMAL LOW (ref 96–112)
GFR, Est African American: >89 mL/min
GFR, Est Non African American: 79 mL/min
Glucose, Bld: 675 mg/dL (ref 70–99)
Potassium: 4.5 mEq/L (ref 3.5–5.3)
SODIUM: 128 meq/L — AB (ref 135–145)
TOTAL PROTEIN: 6.7 g/dL (ref 6.0–8.3)
Total Bilirubin: 0.4 mg/dL (ref 0.2–1.2)

## 2013-05-14 LAB — HEMOGLOBIN A1C
Hgb A1c MFr Bld: 11.9 % — ABNORMAL HIGH (ref <5.7)
MEAN PLASMA GLUCOSE: 295 mg/dL — AB (ref <117)

## 2013-05-14 NOTE — Progress Notes (Signed)
Subjective:    Patient ID: GRADEN HOSHINO, male    DOB: 16-Jul-1970, 43 y.o.   MRN: [id number]  HPI Patient has not been seen here in several years due to legal issues.  He presents for preoperative clearance for right arthroscopic knee surgery.  Per the patient, this will be done under spinal anesthesia and not general anesthesia.  He has a complicated PMH.  First, he has a history of dilated cardiomyopathy.  He underwent a [employer] ICD placement in 0569 (complicated by RA perforation and tamponade).  His last echo in 2013 revealed an EF of 20%, akinetic anteroseptum, moderate MR, severe TR, moderately dilated RV.   He also has a history of paroxysmal atrial fibrillation.  He has not been compliant with coumadin.  He has recently been switched to eliquis by cardiology.  Per the patient, he has received cardiology's permission for his upcoming surgery and they are okay with him holding his eliquis 2 days prior to surgery.    I reviewed his last office visit with his endocrinologist, Dr. Loanne Drilling in 2/15.  At that time, he stated that he was on NPH 40 units in the morning and 20 units in the evening along with glipizide and metformin. His hemoglobin A1c at that time was 8.7. Dr. Loanne Drilling believed he possibly could be managed with oral medication and stopped insulin.  Per the patient, he is now on Actos, metformin, and Amaryl.  He states that his sugars are ranging in the 120s to 130s.  However he tells me that he was on Levemir 40 units and not NPH.  Furthermore he states that he had stopped all insulin months prior to his A1c of 8.7 in 2/15. I ordered lab work at his office visit. 24 hours later the results are as follows: Office Visit on 05/13/2013  Component Date Value Ref Range Status  . WBC 05/13/2013 6.5  4.0 - 10.5 K/uL Final  . RBC 05/13/2013 5.13  4.22 - 5.81 MIL/uL Final  . Hemoglobin 05/13/2013 14.9  13.0 - 17.0 g/dL Final  . HCT 05/13/2013 43.9  39.0 - 52.0 % Final  . MCV 05/13/2013 85.6   78.0 - 100.0 fL Final  . MCH 05/13/2013 29.0  26.0 - 34.0 pg Final  . MCHC 05/13/2013 33.9  30.0 - 36.0 g/dL Final  . RDW 05/13/2013 14.1  11.5 - 15.5 % Final  . Platelets 05/13/2013 242  150 - 400 K/uL Final  . Neutrophils Relative % 05/13/2013 65  43 - 77 % Final  . Neutro Abs 05/13/2013 4.2  1.7 - 7.7 K/uL Final  . Lymphocytes Relative 05/13/2013 24  12 - 46 % Final  . Lymphs Abs 05/13/2013 1.6  0.7 - 4.0 K/uL Final  . Monocytes Relative 05/13/2013 8  3 - 12 % Final  . Monocytes Absolute 05/13/2013 0.5  0.1 - 1.0 K/uL Final  . Eosinophils Relative 05/13/2013 3  0 - 5 % Final  . Eosinophils Absolute 05/13/2013 0.2  0.0 - 0.7 K/uL Final  . Basophils Relative 05/13/2013 0  0 - 1 % Final  . Basophils Absolute 05/13/2013 0.0  0.0 - 0.1 K/uL Final  . Smear Review 05/13/2013 Criteria for review not met   Final  . Sodium 05/13/2013 128* 135 - 145 mEq/L Final  . Potassium 05/13/2013 4.5  3.5 - 5.3 mEq/L Final  . Chloride 05/13/2013 94* 96 - 112 mEq/L Final  . CO2 05/13/2013 27  19 - 32 mEq/L Final  . Glucose, Bld  05/13/2013 675* 70 - 99 mg/dL Final   Result repeated and verified.  . BUN 05/13/2013 21  6 - 23 mg/dL Final  . Creat 05/13/2013 1.13  0.50 - 1.35 mg/dL Final  . Total Bilirubin 05/13/2013 0.4  0.2 - 1.2 mg/dL Final  . Alkaline Phosphatase 05/13/2013 93  39 - 117 U/L Final  . AST 05/13/2013 16  0 - 37 U/L Final  . ALT 05/13/2013 24  0 - 53 U/L Final  . Total Protein 05/13/2013 6.7  6.0 - 8.3 g/dL Final  . Albumin 05/13/2013 3.9  3.5 - 5.2 g/dL Final  . Calcium 05/13/2013 9.0  8.4 - 10.5 mg/dL Final  . GFR, Est African American 05/13/2013 >89   Final  . GFR, Est Non African American 05/13/2013 79   Final   Comment:                            The estimated GFR is a calculation valid for adults (>=68 years old)                          that uses the CKD-EPI algorithm to adjust for age and sex. It is                            not to be used for children, pregnant women,  hospitalized patients,                             patients on dialysis, or with rapidly changing kidney function.                          According to the NKDEP, eGFR >89 is normal, 60-89 shows mild                          impairment, 30-59 shows moderate impairment, 15-29 shows severe                          impairment and <15 is ESRD.                             Marland Kitchen Hemoglobin A1C 05/13/2013 11.9* <5.7 % Final   Comment:                                                                                                 According to the ADA Clinical Practice Recommendations for 2011, when                          HbA1c is used as a screening test:                                                       >=  6.5%   Diagnostic of Diabetes Mellitus                                     (if abnormal result is confirmed)                                                     5.7-6.4%   Increased risk of developing Diabetes Mellitus                                                     References:Diagnosis and Classification of Diabetes Mellitus,Diabetes                          YKDX,8338,25(KNLZJ 1):S62-S69 and Standards of Medical Care in                                  Diabetes - 2011,Diabetes Care,2011,34 (Suppl 1):S11-S61.                             . Mean Plasma Glucose 05/13/2013 295* <117 mg/dL Final    He has a critically high blood sugar greater than 600, but thankfully no acidosis or hypokalemia. He is asymptomatic. He denies any chest pain shortness of breath dyspnea on exertion orthopnea Past Medical History  Diagnosis Date  . Dilated cardiomyopathy     status post ICD placement in 2008 c/b tearing at the atrial junction resulting in tamponade and urgent thoracotomy  . Gout, unspecified   . Dyslipidemia   . Hypertension   . Diabetes uncomplicated adult-type II     on insulin therapy, a1c 12.9 in 01/2011  . Chronic systolic heart failure     2D echo (67/3419) - Systolic function severely  reduced., LV EF 20-25%. Akinesis of apical and anteroseptal mycoardium.  last 2D echo (11/2008 ), LV EF 25-30%, diffuse hypokinesis, and grade 1 diastolic dysfunction.  Marland Kitchen CAD (coronary artery disease)     Cardiac cath (08/19/2000) - Nonobstructive, 40% stenosis of LAD,.   . Paroxysmal atrial fibrillation     on chronic coumadin, goal INR 2-3. status post direct current  . Obesity (BMI 30.0-34.9)   . Hepatic steatosis 2011    seen on ultrasound.   . Anginal pain   . Myocardial infarction 2008  . ICD (implantable cardiac defibrillator) in place 2007  . Shortness of breath   . Pacemaker   . CHF (congestive heart failure)   . Cancer 2013    benign stomach cancer  . Peptic ulcer    Past Surgical History  Procedure Laterality Date  . Icd placement    . Cardiac pacemaker placement    . Left total knee replacement    . Cholecystectomy    . Esophagogastroduodenoscopy  04/05/2011    Procedure: ESOPHAGOGASTRODUODENOSCOPY (EGD);  Surgeon: Irene Shipper, MD;  Location[facility] ENDOSCOPY;  Service: Endoscopy;  Laterality: N/A;  . Joint replacement      left knee replacement  .  Cardiac catheterization     Current Outpatient Prescriptions on File Prior to Visit  Medication Sig Dispense Refill  . ACCU-CHEK FASTCLIX LANCETS MISC 1 each by Does not apply route 2 (two) times daily.  102 each  5  . amLODipine (NORVASC) 5 MG tablet Take 1 tablet (5 mg total) by mouth daily.  30 tablet  11  . apixaban (ELIQUIS) 5 MG TABS tablet Take 1 tablet (5 mg total) by mouth 2 (two) times daily.  60 tablet  6  . atorvastatin (LIPITOR) 20 MG tablet Take 1 tablet (20 mg total) by mouth daily at 6 PM.  30 tablet  11  . Blood Glucose Monitoring Suppl (ONE TOUCH ULTRA MINI) W/DEVICE KIT 1 each by other route once  1 each  0  . colchicine 0.6 MG tablet Take 1 tablet (0.6 mg total) by mouth daily as needed.  30 tablet  3  . digoxin (LANOXIN) 0.125 MG tablet Take 1 tablet (0.125 mg total) by mouth daily.  30 tablet  11  .  divalproex (DEPAKOTE) 500 MG DR tablet Take 500 mg by mouth 2 (two) times daily.      Marland Kitchen FLUoxetine (PROZAC) 20 MG capsule Take 20 mg by mouth 2 (two) times daily.      . furosemide (LASIX) 40 MG tablet Take 1.5 tablets (60 mg total) by mouth 2 (two) times daily.  90 tablet  11  . glimepiride (AMARYL) 4 MG tablet Take 1 tablet (4 mg total) by mouth daily before breakfast.  30 tablet  3  . glucose blood (ONE TOUCH ULTRA TEST) test strip Use as instructed  100 each  12  . hydrOXYzine (ATARAX/VISTARIL) 25 MG tablet Take 25 mg by mouth. 1 tablet BID as needed and 2 tablets at night.      Marland Kitchen lisinopril (PRINIVIL,ZESTRIL) 40 MG tablet Take 1 tablet (40 mg total) by mouth daily.  30 tablet  11  . metFORMIN (GLUCOPHAGE XR) 500 MG 24 hr tablet 2 tabs, twice a day  120 tablet  11  . metoprolol (TOPROL XL) 200 MG 24 hr tablet Take 1 tablet (200 mg total) by mouth daily.  30 tablet  11  . Multiple Vitamin CHEW Chew by mouth daily.      . nitroGLYCERIN (NITROSTAT) 0.4 MG SL tablet Place 1 tablet (0.4 mg total) under the tongue every 5 (five) minutes as needed. For chest pain.  25 tablet  3  . omega-3 acid ethyl esters (LOVAZA) 1 G capsule Take 2 capsules (2 g total) by mouth 2 (two) times daily.  240 capsule  11  . omeprazole (PRILOSEC) 40 MG capsule Take 1 capsule (40 mg total) by mouth daily.  30 capsule  0  . pantoprazole (PROTONIX) 40 MG tablet Take 1 tablet (40 mg total) by mouth 2 (two) times daily.  60 tablet  0  . pioglitazone (ACTOS) 45 MG tablet Take 1 tablet (45 mg total) by mouth daily.  30 tablet  11  . potassium chloride SA (K-DUR,KLOR-CON) 20 MEQ tablet Take 2 tablets (40 mEq total) by mouth daily.  60 tablet  11  . spironolactone (ALDACTONE) 25 MG tablet Take 1 tablet (25 mg total) by mouth daily.  30 tablet  11  . traMADol (ULTRAM) 50 MG tablet Take 1 tablet (50 mg total) by mouth every 6 (six) hours as needed.  20 tablet  0   No current facility-administered medications on file prior to visit.     Allergies  Allergen  Reactions  . Orange Fruit Anaphylaxis  . Penicillins Anaphylaxis   History   Social History  . Marital Status: Married    Spouse Name: N/A    Number of Children: 3  . Years of Education: college   Occupational History  . bar tender    Social History Main Topics  . Smoking status: Never Smoker   . Smokeless tobacco: Never Used  . Alcohol Use: No  . Drug Use: No  . Sexual Activity: Not on file   Other Topics Concern  . Not on file   Social History Narrative   Lives in [place].   Studied sports medicine in college, got a BS - used to work with the [employer].             Review of Systems  All other systems reviewed and are negative.        Objective:   Physical Exam  Vitals reviewed. Constitutional: He appears well-developed and well-nourished. No distress.  HENT:  Head: Normocephalic and atraumatic.  Right Ear: External ear normal.  Left Ear: External ear normal.  Nose: Nose normal.  Mouth/Throat: Oropharynx is clear and moist. No oropharyngeal exudate.  Eyes: Conjunctivae and EOM are normal. Pupils are equal, round, and reactive to light. No scleral icterus.  Neck: Neck supple.  Cardiovascular: Normal rate and regular rhythm.   Murmur heard. Pulmonary/Chest: Effort normal and breath sounds normal. No respiratory distress. He has no wheezes. He has no rales.  Abdominal: Soft. Bowel sounds are normal. He exhibits no distension and no mass. There is no tenderness. There is no rebound and no guarding.  Musculoskeletal: He exhibits no edema.  Lymphadenopathy:    He has no cervical adenopathy.  Skin: He is not diaphoretic.          Assessment & Plan:  1. Type II or unspecified type diabetes mellitus without mention of complication, uncontrolled - Hemoglobin A1c  2. CHF (congestive heart failure) - CBC with Differential - COMPLETE METABOLIC PANEL WITH GFR  3. Preoperative evaluation to rule out surgical  contraindication   I will defer cardiology clearance to his cardiologist. However medically I do not believe this patient is stable for surgery until he gets his sugars under better control. I think he would be high risk for metabolic acidosis if he develops complications from the surgery. Given his significant cardiovascular history I think this would put his health in serious jeopardy.  Therefore I am not inclined to give him medical clearance at this time. He sees endocrinology.  I recommend that he follow up ASAP to titrate his medication further.  Given the severity of his congestive heart failure, I recommended that he discuss discontinuing Actos with his endocrinologist.  Meanwhile I believe the patient needs to resume his Levemir. Begin 40 units subcutaneous daily, and recheck BMP on Monday.  Go to ER if worse.

## 2013-05-14 NOTE — Telephone Encounter (Signed)
I would ask him, would ask PCP to authorize refill.

## 2013-05-19 ENCOUNTER — Other Ambulatory Visit (INDEPENDENT_AMBULATORY_CARE_PROVIDER_SITE_OTHER): Payer: Medicaid Other

## 2013-05-19 ENCOUNTER — Ambulatory Visit: Payer: Medicaid Other

## 2013-05-19 ENCOUNTER — Ambulatory Visit (HOSPITAL_COMMUNITY): Payer: Medicaid Other | Attending: Cardiovascular Disease | Admitting: Radiology

## 2013-05-19 ENCOUNTER — Encounter: Payer: Self-pay | Admitting: Cardiovascular Disease

## 2013-05-19 ENCOUNTER — Encounter: Payer: Self-pay | Admitting: Physician Assistant

## 2013-05-19 DIAGNOSIS — I2510 Atherosclerotic heart disease of native coronary artery without angina pectoris: Secondary | ICD-10-CM | POA: Insufficient documentation

## 2013-05-19 DIAGNOSIS — I4892 Unspecified atrial flutter: Secondary | ICD-10-CM

## 2013-05-19 DIAGNOSIS — I4891 Unspecified atrial fibrillation: Secondary | ICD-10-CM

## 2013-05-19 DIAGNOSIS — I428 Other cardiomyopathies: Secondary | ICD-10-CM | POA: Insufficient documentation

## 2013-05-19 DIAGNOSIS — I509 Heart failure, unspecified: Secondary | ICD-10-CM

## 2013-05-19 DIAGNOSIS — I5022 Chronic systolic (congestive) heart failure: Secondary | ICD-10-CM

## 2013-05-19 LAB — BASIC METABOLIC PANEL
BUN: 24 mg/dL — ABNORMAL HIGH (ref 6–23)
CHLORIDE: 96 meq/L (ref 96–112)
CO2: 28 mEq/L (ref 19–32)
CREATININE: 1.3 mg/dL (ref 0.4–1.5)
Calcium: 9 mg/dL (ref 8.4–10.5)
GFR: 65.37 mL/min (ref >60.00)
GLUCOSE: 346 mg/dL — AB (ref 70–99)
Potassium: 3.8 mEq/L (ref 3.5–5.1)
Sodium: 132 mEq/L — ABNORMAL LOW (ref 135–145)

## 2013-05-19 NOTE — Progress Notes (Signed)
Echocardiogram performed.  

## 2013-05-20 ENCOUNTER — Telehealth: Payer: Self-pay | Admitting: *Deleted

## 2013-05-20 LAB — DIGOXIN LEVEL: Digoxin Level: 0.5 ng/mL — ABNORMAL LOW (ref 0.8–2.0)

## 2013-05-20 NOTE — Telephone Encounter (Signed)
lmptcb for lab and echo results and recommendations

## 2013-05-20 NOTE — Telephone Encounter (Signed)
Per literature and pharmacist, contraindications major between the preferred drug LOPID and patients atorvastatin, will do PA for patients LOVAZA.

## 2013-05-20 NOTE — Telephone Encounter (Signed)
Message copied by Fernande Boyden on Thu May 20, 2013  8:25 AM ------      Message from: Evans Lance      Created: Tue May 18, 2013 11:18 AM      Regarding: RE: medicaid formulary for [place]       Yes. GT      ----- Message -----         From: Fernande Boyden, RN         Sent: 05/11/2013   6:12 PM           To: Evans Lance, MD, Fernande Boyden, RN      Subject: medicaid formulary for [place]                            Medicaid will not cover omega 3 ethy (lovaza) non preferred drug, will cover gemfibrozil (generic for Lopid), can we order this?            Thanks, Lovett Sox, RN       ------

## 2013-05-21 NOTE — Telephone Encounter (Signed)
pt notified about all test results, echo and labs with verbal understanding to all results. Pt has appt with PCP Dr. Loanne Drilling 05/26/13 about eleavted glucose. Pt advised to keep f/u with Dr. Aundra Dubin in June, pt said ok and thank you.

## 2013-05-26 ENCOUNTER — Encounter: Payer: Self-pay | Admitting: Endocrinology

## 2013-05-26 ENCOUNTER — Ambulatory Visit (INDEPENDENT_AMBULATORY_CARE_PROVIDER_SITE_OTHER): Payer: Medicaid Other | Admitting: Endocrinology

## 2013-05-26 VITALS — BP 130/94 | HR 72 | Temp 97.9°F | Ht 75.0 in | Wt 263.0 lb

## 2013-05-26 DIAGNOSIS — E119 Type 2 diabetes mellitus without complications: Secondary | ICD-10-CM

## 2013-05-26 MED ORDER — INSULIN ASPART 100 UNIT/ML FLEXPEN: 40.0000 [IU] | PEN_INJECTOR | Freq: Three times a day (TID) | SUBCUTANEOUS | Status: DC

## 2013-05-26 MED ORDER — INSULIN GLARGINE 100 UNIT/ML SOLOSTAR PEN: 100.0000 [IU] | PEN_INJECTOR | Freq: Every day | SUBCUTANEOUS | Status: DC

## 2013-05-26 NOTE — Patient Instructions (Addendum)
check your blood sugar 4 times a day.  vary the time of day when you check, between before the 3 meals, and at bedtime.  also check if you have symptoms of your blood sugar being too high or too low.  please keep a record of the readings and bring it to your next appointment here.  You can write it on any piece of paper.  please call us sooner if your blood sugar goes below 70, or if you have a lot of readings over 200.  i have sent prescriptions to your pharmacy, for a new meter and strips.  Please take lantus, 100 units at bedtime, and: novolog 40 units 3 times a day (just before each meal). You can stop the diabetes pills. Please come back for a follow-up appointment in 2 weeks.

## 2013-05-26 NOTE — Progress Notes (Signed)
Subjective:    Patient ID: Corey Palmer, male    DOB: 06-20-1970, 43 y.o.   MRN: [id number]  HPI pt returns for f/u of insulin-requiring DM (dx'ed 2010; he has mild neuropathy of the lower extremities, and associated CAD; he has been on insulin since 2011; he has never had severe hypoglycemia or DKA). He was incarcerated x 15 months (ending in early 2015).  While there, he lost down to 188 lbs, but then regained the weight).  He resumed insulin 2 weeks ago he takes lantus+novolog, for a total of approx 180 units/day.  This is almost the most he has ever taken.  no cbg record, but states cbg's vary from 220-400.  pt states he feels well in general.  Past Medical History  Diagnosis Date  . Dilated cardiomyopathy     status post ICD placement in 2008 c/b tearing at the atrial junction resulting in tamponade and urgent thoracotomy  . Gout, unspecified   . Dyslipidemia   . Hypertension   . Diabetes uncomplicated adult-type II     on insulin therapy, a1c 12.9 in 01/2011  . Chronic systolic heart failure     2D echo (123456) - Systolic function severely reduced., LV EF 20-25%. Akinesis of apical and anteroseptal mycoardium.  last 2D echo (11/2008 ), LV EF 25-30%, diffuse hypokinesis, and grade 1 diastolic dysfunction.  Marland Kitchen CAD (coronary artery disease)     Cardiac cath (08/19/2000) - Nonobstructive, 40% stenosis of LAD,.   . Paroxysmal atrial fibrillation     on chronic coumadin, goal INR 2-3. status post direct current  . Obesity (BMI 30.0-34.9)   . Hepatic steatosis 2011    seen on ultrasound.   . Anginal pain   . Myocardial infarction 2008  . ICD (implantable cardiac defibrillator) in place 2007  . Shortness of breath   . Pacemaker   . CHF (congestive heart failure)   . Cancer 2013    benign stomach cancer  . Peptic ulcer   . Hx of echocardiogram     Echo (04/2013):  Mild LVH, EF 20-25%, mild LAE, mild to mod RVE with mild reduced RVSF.    Past Surgical History  Procedure Laterality  Date  . Icd placement    . Cardiac pacemaker placement    . Left total knee replacement    . Cholecystectomy    . Esophagogastroduodenoscopy  04/05/2011    Procedure: ESOPHAGOGASTRODUODENOSCOPY (EGD);  Surgeon: Irene Shipper, MD;  Location: [facility] ENDOSCOPY;  Service: Endoscopy;  Laterality: N/A;  . Joint replacement      left knee replacement  . Cardiac catheterization      History   Social History  . Marital Status: Married    Spouse Name: N/A    Number of Children: 3  . Years of Education: college   Occupational History  . bar tender    Social History Main Topics  . Smoking status: Never Smoker   . Smokeless tobacco: Never Used  . Alcohol Use: No  . Drug Use: No  . Sexual Activity: Not on file   Other Topics Concern  . Not on file   Social History Narrative   Lives in [place].   Studied sports medicine in college, got a BS - used to work with the [employer].          Current Outpatient Prescriptions on File Prior to Visit  Medication Sig Dispense Refill  . ACCU-CHEK FASTCLIX LANCETS MISC 1 each by Does not apply route 2 (  two) times daily.  102 each  5  . amLODipine (NORVASC) 5 MG tablet Take 1 tablet (5 mg total) by mouth daily.  30 tablet  11  . apixaban (ELIQUIS) 5 MG TABS tablet Take 1 tablet (5 mg total) by mouth 2 (two) times daily.  60 tablet  6  . atorvastatin (LIPITOR) 20 MG tablet Take 1 tablet (20 mg total) by mouth daily at 6 PM.  30 tablet  11  . colchicine 0.6 MG tablet Take 1 tablet (0.6 mg total) by mouth daily as needed.  30 tablet  3  . digoxin (LANOXIN) 0.125 MG tablet Take 1 tablet (0.125 mg total) by mouth daily.  30 tablet  11  . divalproex (DEPAKOTE) 500 MG DR tablet Take 500 mg by mouth 2 (two) times daily.      Marland Kitchen FLUoxetine (PROZAC) 20 MG capsule Take 20 mg by mouth 2 (two) times daily.      . furosemide (LASIX) 40 MG tablet Take 1.5 tablets (60 mg total) by mouth 2 (two) times daily.  90 tablet  11  . hydrOXYzine (ATARAX/VISTARIL) 25 MG tablet  Take 25 mg by mouth. 1 tablet BID as needed and 2 tablets at night.      Marland Kitchen lisinopril (PRINIVIL,ZESTRIL) 40 MG tablet Take 1 tablet (40 mg total) by mouth daily.  30 tablet  11  . metoprolol (TOPROL XL) 200 MG 24 hr tablet Take 1 tablet (200 mg total) by mouth daily.  30 tablet  11  . Multiple Vitamin CHEW Chew by mouth daily.      . nitroGLYCERIN (NITROSTAT) 0.4 MG SL tablet Place 1 tablet (0.4 mg total) under the tongue every 5 (five) minutes as needed. For chest pain.  25 tablet  3  . omega-3 acid ethyl esters (LOVAZA) 1 G capsule Take 2 capsules (2 g total) by mouth 2 (two) times daily.  240 capsule  11  . omeprazole (PRILOSEC) 40 MG capsule TAKE 1 CAPSULE (40 MG TOTAL) BY MOUTH DAILY.  30 capsule  0  . pantoprazole (PROTONIX) 40 MG tablet TAKE 1 TABLET (40 MG TOTAL) BY MOUTH 2 (TWO) TIMES DAILY.  60 tablet  0  . pioglitazone (ACTOS) 45 MG tablet Take 1 tablet (45 mg total) by mouth daily.  30 tablet  11  . potassium chloride SA (K-DUR,KLOR-CON) 20 MEQ tablet Take 2 tablets (40 mEq total) by mouth daily.  60 tablet  11  . spironolactone (ALDACTONE) 25 MG tablet Take 1 tablet (25 mg total) by mouth daily.  30 tablet  11  . traMADol (ULTRAM) 50 MG tablet Take 1 tablet (50 mg total) by mouth every 6 (six) hours as needed.  20 tablet  0   No current facility-administered medications on file prior to visit.    Allergies  Allergen Reactions  . Orange Fruit Anaphylaxis  . Penicillins Anaphylaxis    Family History  Problem Relation Age of Onset  . Diabetes Mother   . Coronary artery disease Mother 36    Died of MI at 66  . Gout Mother   . Heart disease Mother   . Heart attack Mother   . Diabetes Father   . Coronary artery disease Father 71    Died in a house fire   . Heart disease Father   . Heart disease Brother     BP 130/94  Pulse 72  Temp(Src) 97.9 F (36.6 C) (Oral)  Ht 6\' 3"  (1.905 m)  Wt 263 lb (119.296 kg)  BMI 32.87 kg/m2  SpO2 97%  Review of Systems He denies  hypoglycemia and n/v.    Objective:   Physical Exam VITAL SIGNS:  See vs page GENERAL: no distress SKIN:  Insulin injection sites at the anterior abdomen are normal  Lab Results  Component Value Date   HGBA1C 11.9* 05/13/2013       Assessment & Plan:  DM: poor control. Weight gain: this complicates the rx of DM. Noncompliance with cbg recording: this also complicates the rx of DM.

## 2013-06-08 ENCOUNTER — Encounter: Payer: Self-pay | Admitting: Endocrinology

## 2013-06-08 ENCOUNTER — Ambulatory Visit (INDEPENDENT_AMBULATORY_CARE_PROVIDER_SITE_OTHER): Payer: Medicaid Other | Admitting: *Deleted

## 2013-06-08 ENCOUNTER — Ambulatory Visit (INDEPENDENT_AMBULATORY_CARE_PROVIDER_SITE_OTHER): Payer: Medicaid Other | Admitting: Endocrinology

## 2013-06-08 VITALS — BP 130/92 | HR 93 | Temp 98.4°F | Ht 75.0 in | Wt 266.0 lb

## 2013-06-08 DIAGNOSIS — E119 Type 2 diabetes mellitus without complications: Secondary | ICD-10-CM

## 2013-06-08 DIAGNOSIS — I4891 Unspecified atrial fibrillation: Secondary | ICD-10-CM

## 2013-06-08 LAB — BASIC METABOLIC PANEL
BUN: 25 mg/dL — ABNORMAL HIGH (ref 6–23)
CALCIUM: 8.8 mg/dL (ref 8.4–10.5)
CO2: 27 meq/L (ref 19–32)
Chloride: 98 mEq/L (ref 96–112)
Creatinine, Ser: 1.3 mg/dL (ref 0.4–1.5)
GFR: 63.05 mL/min (ref >60.00)
GLUCOSE: 386 mg/dL — AB (ref 70–99)
Potassium: 3.8 mEq/L (ref 3.5–5.1)
Sodium: 133 mEq/L — ABNORMAL LOW (ref 135–145)

## 2013-06-08 LAB — CBC
HCT: 40.9 % (ref 39.0–52.0)
HEMOGLOBIN: 13.9 g/dL (ref 13.0–17.0)
MCHC: 34 g/dL (ref 30.0–36.0)
MCV: 84.8 fl (ref 78.0–100.0)
Platelets: 173 10*3/uL (ref 150.0–400.0)
RBC: 4.82 Mil/uL (ref 4.22–5.81)
RDW: 13.3 % (ref 11.5–15.5)
WBC: 6.5 10*3/uL (ref 4.0–10.5)

## 2013-06-08 NOTE — Telephone Encounter (Signed)
Spoke with Sherri--original clearance form was faxed during time patient was taking coumadin.  He is since changed to eliquis.  Received new fax requesting ok for surgery with instruction for holding eliquis.  Forms left with Theodosia Quay, RN covering Dr. Aundra Dubin this afternoon.  Patient's last dose of eliquis was 5/11, surgery is pending for 5/14.

## 2013-06-08 NOTE — Progress Notes (Signed)
Subjective:    Patient ID: Corey Palmer, male    DOB: 08/31/1970, 43 y.o.   MRN: [id number]  HPI pt returns for f/u of insulin-requiring DM (dx'ed 2010; he has mild neuropathy of the lower extremities, and associated CAD; he has been on insulin since 2011; he has never had severe hypoglycemia or DKA). He was incarcerated x 15 months (ending in early 2015); when he was released, he resumed insulin).  He takes lantus 100 units qd, and a widely varying dosage of novolog (averages approx 20 units per day), according to his cbg.  no cbg record, but states cbg's vary from 66-139.   Past Medical History  Diagnosis Date  . Dilated cardiomyopathy     status post ICD placement in 2008 c/b tearing at the atrial junction resulting in tamponade and urgent thoracotomy  . Gout, unspecified   . Dyslipidemia   . Hypertension   . Diabetes uncomplicated adult-type II     on insulin therapy, a1c 12.9 in 01/2011  . Chronic systolic heart failure     2D echo (123456) - Systolic function severely reduced., LV EF 20-25%. Akinesis of apical and anteroseptal mycoardium.  last 2D echo (11/2008 ), LV EF 25-30%, diffuse hypokinesis, and grade 1 diastolic dysfunction.  Marland Kitchen CAD (coronary artery disease)     Cardiac cath (08/19/2000) - Nonobstructive, 40% stenosis of LAD,.   . Paroxysmal atrial fibrillation     on chronic coumadin, goal INR 2-3. status post direct current  . Obesity (BMI 30.0-34.9)   . Hepatic steatosis 2011    seen on ultrasound.   . Anginal pain   . Myocardial infarction 2008  . ICD (implantable cardiac defibrillator) in place 2007  . Shortness of breath   . Pacemaker   . CHF (congestive heart failure)   . Cancer 2013    benign stomach cancer  . Peptic ulcer   . Hx of echocardiogram     Echo (04/2013):  Mild LVH, EF 20-25%, mild LAE, mild to mod RVE with mild reduced RVSF.    Past Surgical History  Procedure Laterality Date  . Icd placement    . Cardiac pacemaker placement    . Left total  knee replacement    . Cholecystectomy    . Esophagogastroduodenoscopy  04/05/2011    Procedure: ESOPHAGOGASTRODUODENOSCOPY (EGD);  Surgeon: Irene Shipper, MD;  Location[facility] ENDOSCOPY;  Service: Endoscopy;  Laterality: N/A;  . Joint replacement      left knee replacement  . Cardiac catheterization      History   Social History  . Marital Status: Married    Spouse Name: N/A    Number of Children: 3  . Years of Education: college   Occupational History  . bar tender    Social History Main Topics  . Smoking status: Never Smoker   . Smokeless tobacco: Never Used  . Alcohol Use: No  . Drug Use: No  . Sexual Activity: Not on file   Other Topics Concern  . Not on file   Social History Narrative   Lives in [place].   Studied sports medicine in college, got a BS - used to work with the [employer].          Current Outpatient Prescriptions on File Prior to Visit  Medication Sig Dispense Refill  . ACCU-CHEK FASTCLIX LANCETS MISC 1 each by Does not apply route 2 (two) times daily.  102 each  5  . amLODipine (NORVASC) 5 MG tablet Take 1  tablet (5 mg total) by mouth daily.  30 tablet  11  . apixaban (ELIQUIS) 5 MG TABS tablet Take 1 tablet (5 mg total) by mouth 2 (two) times daily.  60 tablet  6  . atorvastatin (LIPITOR) 20 MG tablet Take 1 tablet (20 mg total) by mouth daily at 6 PM.  30 tablet  11  . colchicine 0.6 MG tablet Take 1 tablet (0.6 mg total) by mouth daily as needed.  30 tablet  3  . digoxin (LANOXIN) 0.125 MG tablet Take 1 tablet (0.125 mg total) by mouth daily.  30 tablet  11  . divalproex (DEPAKOTE) 500 MG DR tablet Take 500 mg by mouth 2 (two) times daily.      Marland Kitchen FLUoxetine (PROZAC) 20 MG capsule Take 20 mg by mouth 2 (two) times daily.      . furosemide (LASIX) 40 MG tablet Take 1.5 tablets (60 mg total) by mouth 2 (two) times daily.  90 tablet  11  . hydrOXYzine (ATARAX/VISTARIL) 25 MG tablet Take 25 mg by mouth. 1 tablet BID as needed and 2 tablets at night.        Marland Kitchen lisinopril (PRINIVIL,ZESTRIL) 40 MG tablet Take 1 tablet (40 mg total) by mouth daily.  30 tablet  11  . metoprolol (TOPROL XL) 200 MG 24 hr tablet Take 1 tablet (200 mg total) by mouth daily.  30 tablet  11  . Multiple Vitamin CHEW Chew by mouth daily.      . nitroGLYCERIN (NITROSTAT) 0.4 MG SL tablet Place 1 tablet (0.4 mg total) under the tongue every 5 (five) minutes as needed. For chest pain.  25 tablet  3  . omega-3 acid ethyl esters (LOVAZA) 1 G capsule Take 2 capsules (2 g total) by mouth 2 (two) times daily.  240 capsule  11  . omeprazole (PRILOSEC) 40 MG capsule TAKE 1 CAPSULE (40 MG TOTAL) BY MOUTH DAILY.  30 capsule  0  . pantoprazole (PROTONIX) 40 MG tablet TAKE 1 TABLET (40 MG TOTAL) BY MOUTH 2 (TWO) TIMES DAILY.  60 tablet  0  . pioglitazone (ACTOS) 45 MG tablet Take 1 tablet (45 mg total) by mouth daily.  30 tablet  11  . potassium chloride SA (K-DUR,KLOR-CON) 20 MEQ tablet Take 2 tablets (40 mEq total) by mouth daily.  60 tablet  11  . spironolactone (ALDACTONE) 25 MG tablet Take 1 tablet (25 mg total) by mouth daily.  30 tablet  11  . traMADol (ULTRAM) 50 MG tablet Take 1 tablet (50 mg total) by mouth every 6 (six) hours as needed.  20 tablet  0   No current facility-administered medications on file prior to visit.   Allergies  Allergen Reactions  . Orange Fruit Anaphylaxis  . Penicillins Anaphylaxis   Family History  Problem Relation Age of Onset  . Diabetes Mother   . Coronary artery disease Mother 38    Died of MI at 3  . Gout Mother   . Heart disease Mother   . Heart attack Mother   . Diabetes Father   . Coronary artery disease Father 11    Died in a house fire   . Heart disease Father   . Heart disease Brother     BP 130/92  Pulse 93  Temp(Src) 98.4 F (36.9 C) (Oral)  Ht 6\' 3"  (1.905 m)  Wt 266 lb (120.657 kg)  BMI 33.25 kg/m2  SpO2 97%  Review of Systems Denies LOC and weight change.  Objective:   Physical Exam VITAL SIGNS:  See vs  page GENERAL: no distress.   SKIN:  Insulin injection sites at the anterior abdomen are normal  Lab Results  Component Value Date   HGBA1C 11.9* 05/13/2013      Assessment & Plan:  DM: control is apparently much better. Noncompliance: in this setting, he should take insulin just qd.

## 2013-06-08 NOTE — Patient Instructions (Signed)
A full discussion of the nature of anticoagulants has been carried out.  A benefit/risk analysis has been presented to the patient, so that they understand the justification for choosing anticoagulation with Eliquis at this time.  The need for compliance is stressed.  Pt is aware to take the medication twice daily.  Side effects of potential bleeding are discussed, including unusual colored urine or stools, coughing up blood or coffee ground emesis, nose bleeds or serious fall or head trauma.  Discussed signs and symptoms of stroke. The patient should avoid any OTC items containing aspirin or ibuprofen.  Avoid alcohol consumption.   Call if any signs of abnormal bleeding.  Discussed financial obligations and resolved any difficulty in obtaining medication.  Next lab test test in 6 months.     

## 2013-06-08 NOTE — Progress Notes (Signed)
Pt was started on Eliquis for Afib  on 05/11/13.    Reviewed patients medication list.  Pt is not  currently on any combined P-gp and strong CYP3A4 inhibitors/inducers (ketoconazole, traconazole, ritonavir, carbamazepine, phenytoin, rifampin, St. John's wort).  Reviewed labs.  SCr 1.3, Weight 121.2 Kg.   Dose appropriate  based on criteria of age, weight and Scr.   Hgb and HCT 13.9/40.9.   A full discussion of the nature of anticoagulants has been carried out.  A benefit/risk analysis has been presented to the patient, so that they understand the justification for choosing anticoagulation with Eliquis at this time.  The need for compliance is stressed.  Pt is aware to take the medication twice daily.  Side effects of potential bleeding are discussed, including unusual colored urine or stools, coughing up blood or coffee ground emesis, nose bleeds or serious fall or head trauma.  Discussed signs and symptoms of stroke. The patient should avoid any OTC items containing aspirin or ibuprofen.  Avoid alcohol consumption.   Call if any signs of abnormal bleeding.  Discussed financial obligations and resolved any difficulty in obtaining medication.  Next lab test test in 6 months.

## 2013-06-08 NOTE — Patient Instructions (Addendum)
check your blood sugar 4 times a day.  vary the time of day when you check, between before the 3 meals, and at bedtime.  also check if you have symptoms of your blood sugar being too high or too low.  please keep a record of the readings and bring it to your next appointment here.  You can write it on any piece of paper.  please call us sooner if your blood sugar goes below 70, or if you have a lot of readings over 200.  i have sent prescriptions to your pharmacy, for a new meter and strips.  Please increase the lantus to 130 units each morning (just take 50 if you are going to be active).   Stop taking the novolog. Please come back for a follow-up appointment in 2 months.

## 2013-06-08 NOTE — Telephone Encounter (Signed)
Per Dr Aundra Dubin the pt can hold Eliquis today, tomorrow and Thursday morning in preparation for surgery.  Restart Eliquis after surgery. Pt aware of instructions and I will contact Sherri at [facility] and make her aware of Dr Claris Gladden comments. Office [phone], Fax [phone]

## 2013-06-08 NOTE — Telephone Encounter (Signed)
New message    Please advise on stop eliquis prior to surgery.   Pending Thursday surgery.

## 2013-06-09 ENCOUNTER — Other Ambulatory Visit: Payer: Self-pay | Admitting: Orthopedic Surgery

## 2013-06-09 ENCOUNTER — Encounter (HOSPITAL_COMMUNITY): Payer: Self-pay | Admitting: Pharmacy Technician

## 2013-06-09 NOTE — Telephone Encounter (Signed)
I have faxed this note and left a voicemail for Sherri in regards to Eliquis.

## 2013-06-09 NOTE — Pre-Procedure Instructions (Signed)
Corey Palmer  06/09/2013   Your procedure is scheduled on:  Friday, Jun 11, 2013 at 10:45 AM.  Report to [facility] Short Stay Main Entrance "A" at 8:45 AM.  Call this number if you have problems the morning of surgery: [phone]   Remember:   Do not eat food or drink liquids after midnight.   Take these medicines the morning of surgery with A SIP OF WATER :amLODipine (NORVASC), digoxin (LANOXIN)  divalproex (DEPAKOTE), FLUoxetine (PROZAC), metoprolol (TOPROL XL),  omeprazole (PRILOSEC), pantoprazole (PROTONIX), if needed:traMADol (ULTRAM) for moderate pain Colchicine for gout   DO NOT take any diabetic medications the morning of your surgery   Do not wear jewelry.  Do not wear lotions, powders, or colognes.   Men may shave face and neck.  Do not bring valuables to the hospital.  [facility] is not responsible for any belongings or valuables.               Contacts, dentures or bridgework may not be worn into surgery.  Leave suitcase in the car. After surgery it may be brought to your room.  For patients admitted to the hospital, discharge time is determined by your  treatment team.               Patients discharged the day of surgery will not be allowed to drive home.  Name and phone number of your driver:   Special Instructions: Shower using CHG the night before surgery and the morning of surgery.   Please read over the following fact sheets that you were given: Pain Booklet, Coughing and Deep Breathing and Surgical Site Infection Prevention

## 2013-06-10 ENCOUNTER — Encounter (HOSPITAL_COMMUNITY): Payer: Self-pay

## 2013-06-10 ENCOUNTER — Ambulatory Visit (HOSPITAL_COMMUNITY)
Admission: RE | Admit: 2013-06-10 | Discharge: 2013-06-10 | Disposition: A | Discharge status: Home / Self Care | Payer: Medicaid Other | Source: Ambulatory Visit | Attending: Orthopedic Surgery | Admitting: Orthopedic Surgery

## 2013-06-10 ENCOUNTER — Encounter (HOSPITAL_COMMUNITY)
Admission: RE | Admit: 2013-06-10 | Discharge: 2013-06-10 | Disposition: A | Discharge status: Home / Self Care | Payer: Medicaid Other | Source: Ambulatory Visit | Attending: Orthopedic Surgery | Admitting: Orthopedic Surgery

## 2013-06-10 HISTORY — DX: Depression, unspecified: F32.A

## 2013-06-10 HISTORY — DX: Unspecified osteoarthritis, unspecified site: M19.90

## 2013-06-10 HISTORY — DX: Personal history of urinary calculi: Z87.442

## 2013-06-10 HISTORY — DX: Bipolar disorder, unspecified: F31.9

## 2013-06-10 HISTORY — DX: Major depressive disorder, single episode, unspecified: F32.9

## 2013-06-10 HISTORY — DX: Sleep apnea, unspecified: G47.30

## 2013-06-10 HISTORY — DX: Presence of automatic (implantable) cardiac defibrillator: Z95.810

## 2013-06-10 HISTORY — DX: Cardiac arrhythmia, unspecified: I49.9

## 2013-06-10 HISTORY — DX: Anxiety disorder, unspecified: F41.9

## 2013-06-10 HISTORY — DX: Gastro-esophageal reflux disease without esophagitis: K21.9

## 2013-06-10 NOTE — Progress Notes (Signed)
Nurse called [facility] to ensure that fax was received and operator told Nurse that fax was currently on Dr. Tanna Furry desk.

## 2013-06-10 NOTE — Progress Notes (Signed)
Perioperative prescription for implanted cardiac device programming sheet faxed to [facility].

## 2013-06-10 NOTE — Progress Notes (Signed)
PCP is Jenna Luo at [facility]. Cardiologist is Loralie Champagne and patient sees Dr. Cristopher Peru for ICD. Endocrinologist is Renato Shin. Patient has lab work done on 06/08/13. Ebony Hail, [place] notified of patients glucose and history.

## 2013-06-10 NOTE — Progress Notes (Signed)
Anesthesia Chart Review:  Patient is a 43 year old male scheduled for antroscopy, loose body removal and debridement tomorrow by Dr. Mardelle Matte. Reportedly case was moved from day surgery due to OSA history without CPAP.  He also has a significant cardiac history including severe LV dysfunction and AICD.  Other history includes nonischemic cardiomyopathy, nonobstructive CAD, MI '08, chronic systolic CHF, paroxysmal atrial fibrillation, s/p Medtronic AICD '08, uincontrolled DM2, depression, bipolar disorder, anxiety, GERD, arthritis, gout, dyslipidemia, nephrolithiasis, peptic ulcer disease with GI bleed '12, hepatic steatosis, left TKA. PCP is listed as Dr. Jenna Luo.  Endocrinologist is Dr. Loanne Drilling.  Primary cardiologist is Dr. Aundra Dubin.  EP cardiologist is Dr. Cristopher Peru. Last cardiology visit was on 05/05/13 with Richardson Dopp, PA-C. He was having issues with keeping a therapeutic INR on warfarin, so he was switched to Eliquis. He last ICD shock was > 1 year ago in the setting of no medications.  He was recently released from jail in 02/2013. Dr. Aundra Dubin is aware of plans for surgery and gave permission to hold Eliquis starting 06/08/13 with plans to restart post-operatively.  Echo on 05/19/13 showed:  - Left ventricle: The cavity size was severely dilated. Wall thickness was increased in a pattern of mild LVH. Systolic function was severely reduced. The estimated ejection fraction was in the range of 20% to 25%. Severe hypokinesis. There was an increased relative contribution of atrial contraction to ventricular filling. - Left atrium: The atrium was mildly dilated. - Right ventricle: The cavity size was mildly to moderately dilated. Systolic function was mildly reduced.  EKG on 05/05/13 showed NSR, possible LAE, ST/T wave abnormality, consider lateral ischemia.   According to 05/05/13 cardiology notes, he had a cath in 2002 which showed 40% LAD.  Chest x-ray on 06/10/13 showed: No active  cardiopulmonary disease. Borderline cardiomegaly again noted.  He had labs drawn on 06/08/13 with elevated glucose of 386. Reportedly, his oral DM agents were stopped at that time and he was started on an insulin regimen.  Reportedly, his fasting glucose this morning was 158.  I left a message for [name] at Dr. Luanna Cole office regarding his recent hyperglycemia.  Hopefully since he is on a new insulin regimen, he glucose will be reasonable for surgery. His AST/ALT were normal in March and April of this year. I also routed recent CBC and BMET to Dr. Mardelle Matte.  Cardiology is aware of plans for surgery.  Case was moved to main OR due to current no use of CPAP and possible due to underlying cardiac history.  He will get a fasting CBG on arrival tomorrow.  Definitive plan will depend on those results tomorrow.    George [facility] Short Stay Center/Anesthesiology Phone [phone] 06/10/2013 4:13 PM

## 2013-06-11 ENCOUNTER — Encounter (HOSPITAL_COMMUNITY): Payer: Medicaid Other | Admitting: Vascular Surgery

## 2013-06-11 ENCOUNTER — Encounter (HOSPITAL_COMMUNITY): Payer: Self-pay | Admitting: Certified Registered"

## 2013-06-11 ENCOUNTER — Observation Stay (HOSPITAL_COMMUNITY)
Admission: RE | Admit: 2013-06-11 | Discharge: 2013-06-12 | Disposition: A | Discharge status: Home / Self Care | Payer: Medicaid Other | Source: Ambulatory Visit | Attending: Orthopedic Surgery | Admitting: Orthopedic Surgery

## 2013-06-11 ENCOUNTER — Ambulatory Visit (HOSPITAL_COMMUNITY): Payer: Medicaid Other | Admitting: Certified Registered"

## 2013-06-11 ENCOUNTER — Encounter (HOSPITAL_COMMUNITY)
Admission: RE | Disposition: A | Discharge status: Home / Self Care | Payer: Self-pay | Source: Ambulatory Visit | Attending: Orthopedic Surgery

## 2013-06-11 DIAGNOSIS — I5022 Chronic systolic (congestive) heart failure: Secondary | ICD-10-CM | POA: Insufficient documentation

## 2013-06-11 DIAGNOSIS — K7689 Other specified diseases of liver: Secondary | ICD-10-CM | POA: Insufficient documentation

## 2013-06-11 DIAGNOSIS — Z79899 Other long term (current) drug therapy: Secondary | ICD-10-CM | POA: Insufficient documentation

## 2013-06-11 DIAGNOSIS — I4891 Unspecified atrial fibrillation: Secondary | ICD-10-CM | POA: Insufficient documentation

## 2013-06-11 DIAGNOSIS — I2510 Atherosclerotic heart disease of native coronary artery without angina pectoris: Secondary | ICD-10-CM | POA: Insufficient documentation

## 2013-06-11 DIAGNOSIS — Z794 Long term (current) use of insulin: Secondary | ICD-10-CM | POA: Insufficient documentation

## 2013-06-11 DIAGNOSIS — I252 Old myocardial infarction: Secondary | ICD-10-CM | POA: Insufficient documentation

## 2013-06-11 DIAGNOSIS — E119 Type 2 diabetes mellitus without complications: Secondary | ICD-10-CM | POA: Insufficient documentation

## 2013-06-11 DIAGNOSIS — F319 Bipolar disorder, unspecified: Secondary | ICD-10-CM | POA: Insufficient documentation

## 2013-06-11 DIAGNOSIS — F411 Generalized anxiety disorder: Secondary | ICD-10-CM | POA: Insufficient documentation

## 2013-06-11 DIAGNOSIS — Z7901 Long term (current) use of anticoagulants: Secondary | ICD-10-CM | POA: Insufficient documentation

## 2013-06-11 DIAGNOSIS — I428 Other cardiomyopathies: Secondary | ICD-10-CM | POA: Insufficient documentation

## 2013-06-11 DIAGNOSIS — I509 Heart failure, unspecified: Secondary | ICD-10-CM | POA: Insufficient documentation

## 2013-06-11 DIAGNOSIS — Z01818 Encounter for other preprocedural examination: Secondary | ICD-10-CM | POA: Insufficient documentation

## 2013-06-11 DIAGNOSIS — Z01812 Encounter for preprocedural laboratory examination: Secondary | ICD-10-CM | POA: Insufficient documentation

## 2013-06-11 DIAGNOSIS — Z95810 Presence of automatic (implantable) cardiac defibrillator: Secondary | ICD-10-CM | POA: Insufficient documentation

## 2013-06-11 DIAGNOSIS — Z96659 Presence of unspecified artificial knee joint: Secondary | ICD-10-CM | POA: Insufficient documentation

## 2013-06-11 DIAGNOSIS — G4730 Sleep apnea, unspecified: Secondary | ICD-10-CM | POA: Insufficient documentation

## 2013-06-11 DIAGNOSIS — M11261 Other chondrocalcinosis, right knee: Secondary | ICD-10-CM

## 2013-06-11 DIAGNOSIS — M2240 Chondromalacia patellae, unspecified knee: Principal | ICD-10-CM | POA: Insufficient documentation

## 2013-06-11 DIAGNOSIS — E785 Hyperlipidemia, unspecified: Secondary | ICD-10-CM | POA: Insufficient documentation

## 2013-06-11 DIAGNOSIS — M109 Gout, unspecified: Secondary | ICD-10-CM | POA: Insufficient documentation

## 2013-06-11 DIAGNOSIS — E669 Obesity, unspecified: Secondary | ICD-10-CM | POA: Insufficient documentation

## 2013-06-11 DIAGNOSIS — I498 Other specified cardiac arrhythmias: Secondary | ICD-10-CM | POA: Insufficient documentation

## 2013-06-11 DIAGNOSIS — M1120 Other chondrocalcinosis, unspecified site: Secondary | ICD-10-CM | POA: Insufficient documentation

## 2013-06-11 DIAGNOSIS — M94262 Chondromalacia, left knee: Secondary | ICD-10-CM | POA: Diagnosis present

## 2013-06-11 DIAGNOSIS — K219 Gastro-esophageal reflux disease without esophagitis: Secondary | ICD-10-CM | POA: Insufficient documentation

## 2013-06-11 DIAGNOSIS — M129 Arthropathy, unspecified: Secondary | ICD-10-CM | POA: Insufficient documentation

## 2013-06-11 DIAGNOSIS — Z6830 Body mass index (BMI) 30.0-30.9, adult: Secondary | ICD-10-CM | POA: Insufficient documentation

## 2013-06-11 DIAGNOSIS — I10 Essential (primary) hypertension: Secondary | ICD-10-CM | POA: Insufficient documentation

## 2013-06-11 DIAGNOSIS — Z9861 Coronary angioplasty status: Secondary | ICD-10-CM | POA: Insufficient documentation

## 2013-06-11 HISTORY — PX: KNEE ARTHROSCOPY: SHX127

## 2013-06-11 HISTORY — DX: Other chondrocalcinosis, right knee: M11.261

## 2013-06-11 LAB — GLUCOSE, CAPILLARY
Glucose-Capillary: 150 mg/dL — ABNORMAL HIGH (ref 70–99)
Glucose-Capillary: 157 mg/dL — ABNORMAL HIGH (ref 70–99)
Glucose-Capillary: 145 mg/dL — ABNORMAL HIGH (ref 70–99)
Glucose-Capillary: 155 mg/dL — ABNORMAL HIGH (ref 70–99)

## 2013-06-11 SURGERY — ARTHROSCOPY, KNEE
Anesthesia: General | Site: Knee | Laterality: Right

## 2013-06-11 MED ORDER — ONDANSETRON HCL 4 MG/2ML IJ SOLN: 4.0000 mg | Freq: Four times a day (QID) | INTRAMUSCULAR | Status: DC | PRN

## 2013-06-11 MED ORDER — ONDANSETRON HCL 4 MG/2ML IJ SOLN
INTRAMUSCULAR | Status: DC | PRN
  Administered 2013-06-11: 4 mg via INTRAVENOUS

## 2013-06-11 MED ORDER — LACTATED RINGERS IV SOLN
INTRAVENOUS | Status: DC
  Administered 2013-06-11: 09:00:00 via INTRAVENOUS

## 2013-06-11 MED ORDER — OXYCODONE-ACETAMINOPHEN 10-325 MG PO TABS: 1.0000 | ORAL_TABLET | Freq: Four times a day (QID) | ORAL | Status: DC | PRN

## 2013-06-11 MED ORDER — LISINOPRIL 40 MG PO TABS
40.0000 mg | ORAL_TABLET | Freq: Every day | ORAL | Status: DC
  Administered 2013-06-12: 40 mg via ORAL
  Filled 2013-06-11: qty 1

## 2013-06-11 MED ORDER — MIDAZOLAM HCL 2 MG/2ML IJ SOLN
INTRAMUSCULAR | Status: AC
  Filled 2013-06-11: qty 2

## 2013-06-11 MED ORDER — BUPIVACAINE HCL 0.5 % IJ SOLN
INTRAMUSCULAR | Status: DC | PRN
  Administered 2013-06-11: 10 mL

## 2013-06-11 MED ORDER — PROPOFOL 10 MG/ML IV BOLUS
INTRAVENOUS | Status: AC
  Filled 2013-06-11: qty 20

## 2013-06-11 MED ORDER — BISACODYL 10 MG RE SUPP: 10.0000 mg | Freq: Every day | RECTAL | Status: DC | PRN

## 2013-06-11 MED ORDER — FENTANYL CITRATE 0.05 MG/ML IJ SOLN
INTRAMUSCULAR | Status: AC
  Filled 2013-06-11: qty 5

## 2013-06-11 MED ORDER — METHOCARBAMOL 500 MG PO TABS
500.0000 mg | ORAL_TABLET | Freq: Four times a day (QID) | ORAL | Status: DC | PRN
  Administered 2013-06-11 – 2013-06-12 (×2): 500 mg via ORAL
  Filled 2013-06-11 (×2): qty 1

## 2013-06-11 MED ORDER — APIXABAN 5 MG PO TABS
5.0000 mg | ORAL_TABLET | Freq: Two times a day (BID) | ORAL | Status: DC
  Administered 2013-06-11 – 2013-06-12 (×2): 5 mg via ORAL
  Filled 2013-06-11 (×3): qty 1

## 2013-06-11 MED ORDER — DIPHENHYDRAMINE HCL 12.5 MG/5ML PO ELIX
12.5000 mg | ORAL_SOLUTION | ORAL | Status: DC | PRN
  Administered 2013-06-11 – 2013-06-12 (×3): 25 mg via ORAL
  Filled 2013-06-11 (×3): qty 10

## 2013-06-11 MED ORDER — HYDROMORPHONE HCL PF 1 MG/ML IJ SOLN
0.2500 mg | INTRAMUSCULAR | Status: DC | PRN
  Administered 2013-06-11 (×2): 0.5 mg via INTRAVENOUS

## 2013-06-11 MED ORDER — OXYCODONE HCL 5 MG PO TABS
5.0000 mg | ORAL_TABLET | Freq: Once | ORAL | Status: AC | PRN
  Administered 2013-06-11: 5 mg via ORAL

## 2013-06-11 MED ORDER — POTASSIUM CHLORIDE CRYS ER 20 MEQ PO TBCR
40.0000 meq | EXTENDED_RELEASE_TABLET | Freq: Every day | ORAL | Status: DC
  Administered 2013-06-12: 40 meq via ORAL
  Filled 2013-06-11: qty 2

## 2013-06-11 MED ORDER — METOPROLOL SUCCINATE ER 100 MG PO TB24
200.0000 mg | ORAL_TABLET | Freq: Every day | ORAL | Status: DC
  Administered 2013-06-12: 200 mg via ORAL
  Filled 2013-06-11: qty 2

## 2013-06-11 MED ORDER — ONDANSETRON HCL 4 MG PO TABS: 4.0000 mg | ORAL_TABLET | Freq: Four times a day (QID) | ORAL | Status: DC | PRN

## 2013-06-11 MED ORDER — PHENYLEPHRINE HCL 10 MG/ML IJ SOLN
INTRAMUSCULAR | Status: DC | PRN
  Administered 2013-06-11 (×2): 80 ug via INTRAVENOUS

## 2013-06-11 MED ORDER — DIVALPROEX SODIUM 500 MG PO DR TAB
500.0000 mg | DELAYED_RELEASE_TABLET | Freq: Two times a day (BID) | ORAL | Status: DC
  Administered 2013-06-11 – 2013-06-12 (×2): 500 mg via ORAL
  Filled 2013-06-11 (×3): qty 1

## 2013-06-11 MED ORDER — ONDANSETRON HCL 4 MG/2ML IJ SOLN
INTRAMUSCULAR | Status: AC
  Filled 2013-06-11: qty 2

## 2013-06-11 MED ORDER — OXYCODONE-ACETAMINOPHEN 5-325 MG PO TABS
1.0000 | ORAL_TABLET | ORAL | Status: DC | PRN
  Administered 2013-06-11 – 2013-06-12 (×4): 2 via ORAL
  Filled 2013-06-11 (×4): qty 2

## 2013-06-11 MED ORDER — ATORVASTATIN CALCIUM 20 MG PO TABS
20.0000 mg | ORAL_TABLET | Freq: Every day | ORAL | Status: DC
  Administered 2013-06-11: 20 mg via ORAL
  Filled 2013-06-11 (×2): qty 1

## 2013-06-11 MED ORDER — PROMETHAZINE HCL 25 MG PO TABS: 25.0000 mg | ORAL_TABLET | Freq: Four times a day (QID) | ORAL | Status: DC | PRN

## 2013-06-11 MED ORDER — PROPOFOL 10 MG/ML IV BOLUS
INTRAVENOUS | Status: DC | PRN
  Administered 2013-06-11: 150 mg via INTRAVENOUS

## 2013-06-11 MED ORDER — DOCUSATE SODIUM 100 MG PO CAPS
100.0000 mg | ORAL_CAPSULE | Freq: Two times a day (BID) | ORAL | Status: DC
  Administered 2013-06-11: 100 mg via ORAL
  Filled 2013-06-11 (×3): qty 1

## 2013-06-11 MED ORDER — LIDOCAINE HCL (CARDIAC) 20 MG/ML IV SOLN
INTRAVENOUS | Status: DC | PRN
  Administered 2013-06-11: 80 mg via INTRAVENOUS

## 2013-06-11 MED ORDER — SPIRONOLACTONE 25 MG PO TABS
25.0000 mg | ORAL_TABLET | Freq: Every day | ORAL | Status: DC
  Administered 2013-06-12: 25 mg via ORAL
  Filled 2013-06-11: qty 1

## 2013-06-11 MED ORDER — HYDROMORPHONE HCL PF 1 MG/ML IJ SOLN
INTRAMUSCULAR | Status: AC
  Filled 2013-06-11: qty 1

## 2013-06-11 MED ORDER — ARTIFICIAL TEARS OP OINT
TOPICAL_OINTMENT | OPHTHALMIC | Status: DC | PRN
  Administered 2013-06-11: 1 via OPHTHALMIC

## 2013-06-11 MED ORDER — PANTOPRAZOLE SODIUM 40 MG PO TBEC
40.0000 mg | DELAYED_RELEASE_TABLET | Freq: Two times a day (BID) | ORAL | Status: DC
  Administered 2013-06-11 – 2013-06-12 (×2): 40 mg via ORAL
  Filled 2013-06-11 (×2): qty 1

## 2013-06-11 MED ORDER — OXYCODONE HCL 5 MG/5ML PO SOLN: 5.0000 mg | Freq: Once | ORAL | Status: AC | PRN

## 2013-06-11 MED ORDER — OMEGA-3-ACID ETHYL ESTERS 1 G PO CAPS
2.0000 g | ORAL_CAPSULE | Freq: Two times a day (BID) | ORAL | Status: DC
  Administered 2013-06-11 – 2013-06-12 (×2): 2 g via ORAL
  Filled 2013-06-11 (×3): qty 2

## 2013-06-11 MED ORDER — SENNA 8.6 MG PO TABS
1.0000 | ORAL_TABLET | Freq: Two times a day (BID) | ORAL | Status: DC
  Administered 2013-06-11: 8.6 mg via ORAL
  Filled 2013-06-11 (×3): qty 1

## 2013-06-11 MED ORDER — METHOCARBAMOL 1000 MG/10ML IJ SOLN
500.0000 mg | Freq: Four times a day (QID) | INTRAMUSCULAR | Status: DC | PRN
  Filled 2013-06-11: qty 5

## 2013-06-11 MED ORDER — OXYCODONE HCL 5 MG PO TABS
ORAL_TABLET | ORAL | Status: AC
  Filled 2013-06-11: qty 1

## 2013-06-11 MED ORDER — DIGOXIN 125 MCG PO TABS
0.1250 mg | ORAL_TABLET | Freq: Every day | ORAL | Status: DC
  Administered 2013-06-12: 0.125 mg via ORAL
  Filled 2013-06-11: qty 1

## 2013-06-11 MED ORDER — POTASSIUM CHLORIDE IN NACL 20-0.45 MEQ/L-% IV SOLN
INTRAVENOUS | Status: DC
  Administered 2013-06-11 – 2013-06-12 (×2): via INTRAVENOUS
  Filled 2013-06-11 (×3): qty 1000

## 2013-06-11 MED ORDER — LACTATED RINGERS IV SOLN
INTRAVENOUS | Status: DC | PRN
  Administered 2013-06-11 (×2): via INTRAVENOUS

## 2013-06-11 MED ORDER — POLYETHYLENE GLYCOL 3350 17 G PO PACK: 17.0000 g | PACK | Freq: Every day | ORAL | Status: DC | PRN

## 2013-06-11 MED ORDER — METHOCARBAMOL 500 MG PO TABS: 500.0000 mg | ORAL_TABLET | Freq: Four times a day (QID) | ORAL | Status: DC

## 2013-06-11 MED ORDER — FENTANYL CITRATE 0.05 MG/ML IJ SOLN
INTRAMUSCULAR | Status: DC | PRN
  Administered 2013-06-11: 100 ug via INTRAVENOUS
  Administered 2013-06-11: 25 ug via INTRAVENOUS

## 2013-06-11 MED ORDER — PROMETHAZINE HCL 25 MG/ML IJ SOLN: 6.2500 mg | INTRAMUSCULAR | Status: DC | PRN

## 2013-06-11 MED ORDER — METOCLOPRAMIDE HCL 10 MG PO TABS: 5.0000 mg | ORAL_TABLET | Freq: Three times a day (TID) | ORAL | Status: DC | PRN

## 2013-06-11 MED ORDER — AMLODIPINE BESYLATE 5 MG PO TABS
5.0000 mg | ORAL_TABLET | Freq: Every day | ORAL | Status: DC
  Administered 2013-06-12: 5 mg via ORAL
  Filled 2013-06-11: qty 1

## 2013-06-11 MED ORDER — FLUOXETINE HCL 20 MG PO CAPS
20.0000 mg | ORAL_CAPSULE | Freq: Two times a day (BID) | ORAL | Status: DC
  Administered 2013-06-11 – 2013-06-12 (×2): 20 mg via ORAL
  Filled 2013-06-11 (×3): qty 1

## 2013-06-11 MED ORDER — HYDROMORPHONE HCL PF 1 MG/ML IJ SOLN: 0.5000 mg | INTRAMUSCULAR | Status: DC | PRN

## 2013-06-11 MED ORDER — METOCLOPRAMIDE HCL 5 MG/ML IJ SOLN: 5.0000 mg | Freq: Three times a day (TID) | INTRAMUSCULAR | Status: DC | PRN

## 2013-06-11 MED ORDER — NITROGLYCERIN 0.4 MG SL SUBL: 0.4000 mg | SUBLINGUAL_TABLET | SUBLINGUAL | Status: DC | PRN

## 2013-06-11 MED ORDER — OXYCODONE HCL 5 MG PO TABS
5.0000 mg | ORAL_TABLET | ORAL | Status: DC | PRN
  Administered 2013-06-11 – 2013-06-12 (×2): 10 mg via ORAL
  Filled 2013-06-11 (×2): qty 2

## 2013-06-11 MED ORDER — COLCHICINE 0.6 MG PO TABS
0.6000 mg | ORAL_TABLET | Freq: Every day | ORAL | Status: DC | PRN
  Filled 2013-06-11: qty 1

## 2013-06-11 MED ORDER — FUROSEMIDE 40 MG PO TABS
60.0000 mg | ORAL_TABLET | Freq: Two times a day (BID) | ORAL | Status: DC
  Administered 2013-06-12: 60 mg via ORAL
  Filled 2013-06-11 (×3): qty 1

## 2013-06-11 MED ORDER — SENNA-DOCUSATE SODIUM 8.6-50 MG PO TABS: 2.0000 | ORAL_TABLET | Freq: Every day | ORAL | Status: DC

## 2013-06-11 MED ORDER — TRAMADOL HCL 50 MG PO TABS
50.0000 mg | ORAL_TABLET | Freq: Four times a day (QID) | ORAL | Status: DC | PRN
  Administered 2013-06-11: 50 mg via ORAL
  Filled 2013-06-11: qty 1

## 2013-06-11 MED ORDER — SODIUM CHLORIDE 0.9 % IR SOLN
Status: DC | PRN
  Administered 2013-06-11: 6000 mL

## 2013-06-11 MED ORDER — PANTOPRAZOLE SODIUM 40 MG PO TBEC: 80.0000 mg | DELAYED_RELEASE_TABLET | Freq: Every day | ORAL | Status: DC

## 2013-06-11 MED ORDER — HYDROXYZINE HCL 25 MG PO TABS
25.0000 mg | ORAL_TABLET | Freq: Two times a day (BID) | ORAL | Status: DC | PRN
  Administered 2013-06-11 – 2013-06-12 (×2): 25 mg via ORAL
  Filled 2013-06-11 (×2): qty 1

## 2013-06-11 MED ORDER — INSULIN ASPART 100 UNIT/ML ~~LOC~~ SOLN
0.0000 [IU] | Freq: Three times a day (TID) | SUBCUTANEOUS | Status: DC
  Administered 2013-06-11: 2 [IU] via SUBCUTANEOUS
  Administered 2013-06-12 (×2): 5 [IU] via SUBCUTANEOUS

## 2013-06-11 MED ORDER — INSULIN ASPART 100 UNIT/ML ~~LOC~~ SOLN: 4.0000 [IU] | Freq: Three times a day (TID) | SUBCUTANEOUS | Status: DC | PRN

## 2013-06-11 SURGICAL SUPPLY — 53 items
BANDAGE ELASTIC 6 VELCRO ST LF (GAUZE/BANDAGES/DRESSINGS) ×3 IMPLANT
BANDAGE ESMARK 6X9 LF (GAUZE/BANDAGES/DRESSINGS) IMPLANT
BENZOIN TINCTURE PRP APPL 2/3 (GAUZE/BANDAGES/DRESSINGS) ×3 IMPLANT
BLADE 10 SAFETY STRL DISP (BLADE) ×3 IMPLANT
BLADE GREAT WHITE 4.2 (BLADE) IMPLANT
BLADE GREAT WHITE 4.2MM (BLADE)
BLADE SURG 11 STRL SS (BLADE) IMPLANT
BLADE SURG ROTATE 9660 (MISCELLANEOUS) IMPLANT
BNDG ESMARK 6X9 LF (GAUZE/BANDAGES/DRESSINGS)
CLOSURE WOUND 1/2 X4 (GAUZE/BANDAGES/DRESSINGS) ×1
COVER SURGICAL LIGHT HANDLE (MISCELLANEOUS) ×3 IMPLANT
CUFF TOURNIQUET SINGLE 34IN LL (TOURNIQUET CUFF) IMPLANT
CUTTER MENISCUS 3.5MM 6/BX (BLADE) IMPLANT
DRAPE ARTHROSCOPY W/POUCH 114 (DRAPES) ×3 IMPLANT
DRAPE U-SHAPE 47X51 STRL (DRAPES) ×3 IMPLANT
DRSG PAD ABDOMINAL 8X10 ST (GAUZE/BANDAGES/DRESSINGS) IMPLANT
GAUZE XEROFORM 1X8 LF (GAUZE/BANDAGES/DRESSINGS) IMPLANT
GLOVE BIO SURGEON STRL SZ7.5 (GLOVE) ×3 IMPLANT
GLOVE BIOGEL PI IND STRL 8 (GLOVE) ×1 IMPLANT
GLOVE BIOGEL PI INDICATOR 8 (GLOVE) ×2
GLOVE BIOGEL PI ORTHO PRO SZ8 (GLOVE) ×2
GLOVE PI ORTHO PRO STRL SZ8 (GLOVE) ×1 IMPLANT
GLOVE SURG ORTHO 8.0 STRL STRW (GLOVE) ×6 IMPLANT
GOWN STRL REUS W/ TWL LRG LVL3 (GOWN DISPOSABLE) ×2 IMPLANT
GOWN STRL REUS W/ TWL XL LVL3 (GOWN DISPOSABLE) ×2 IMPLANT
GOWN STRL REUS W/TWL LRG LVL3 (GOWN DISPOSABLE) ×4
GOWN STRL REUS W/TWL XL LVL3 (GOWN DISPOSABLE) ×4
KIT ROOM TURNOVER OR (KITS) ×3 IMPLANT
MANIFOLD NEPTUNE II (INSTRUMENTS) IMPLANT
NEEDLE 18GX1X1/2 (RX/OR ONLY) (NEEDLE) IMPLANT
NEEDLE 22X1 1/2 (OR ONLY) (NEEDLE) ×3 IMPLANT
NEEDLE SPNL 18GX3.5 QUINCKE PK (NEEDLE) IMPLANT
NS IRRIG 1000ML POUR BTL (IV SOLUTION) IMPLANT
PACK ARTHROSCOPY DSU (CUSTOM PROCEDURE TRAY) ×3 IMPLANT
PAD ABD 8X10 STRL (GAUZE/BANDAGES/DRESSINGS) ×3 IMPLANT
PAD ARMBOARD 7.5X6 YLW CONV (MISCELLANEOUS) ×6 IMPLANT
PADDING CAST ABS 6INX4YD NS (CAST SUPPLIES) ×2
PADDING CAST ABS COTTON 6X4 NS (CAST SUPPLIES) ×1 IMPLANT
PADDING CAST COTTON 6X4 STRL (CAST SUPPLIES) IMPLANT
SET ARTHROSCOPY TUBING (MISCELLANEOUS) ×2
SET ARTHROSCOPY TUBING LN (MISCELLANEOUS) ×1 IMPLANT
SPONGE GAUZE 4X4 12PLY (GAUZE/BANDAGES/DRESSINGS) IMPLANT
SPONGE LAP 4X18 X RAY DECT (DISPOSABLE) ×3 IMPLANT
STRIP CLOSURE SKIN 1/2X4 (GAUZE/BANDAGES/DRESSINGS) ×2 IMPLANT
SUT MNCRL AB 4-0 PS2 18 (SUTURE) ×3 IMPLANT
SYR 20ML ECCENTRIC (SYRINGE) IMPLANT
SYR CONTROL 10ML LL (SYRINGE) IMPLANT
TOWEL OR 17X24 6PK STRL BLUE (TOWEL DISPOSABLE) ×3 IMPLANT
TOWEL OR 17X26 10 PK STRL BLUE (TOWEL DISPOSABLE) ×3 IMPLANT
TUBE CONNECTING 12'X1/4 (SUCTIONS) ×1
TUBE CONNECTING 12X1/4 (SUCTIONS) ×2 IMPLANT
WAND HAND CNTRL MULTIVAC 90 (MISCELLANEOUS) IMPLANT
WATER STERILE IRR 1000ML POUR (IV SOLUTION) ×3 IMPLANT

## 2013-06-11 NOTE — Op Note (Signed)
06/11/2013  1:53 PM  PATIENT:  Corey Palmer    PRE-OPERATIVE DIAGNOSIS:  LOOSE BODY AND CHRONDROMALACIA RIGHT KNEE   POST-OPERATIVE DIAGNOSIS:  Right knee chondromalacia patella with diffuse chondrocalcinosis, effectively coating the lateral femoral condyle and undersurface of the patella  PROCEDURE:  RIGHT KNEE ARTHROSCOPY with chondroplasty  SURGEON:  Johnny Bridge, MD  PHYSICIAN ASSISTANT: None  ANESTHESIA:   General  PREOPERATIVE INDICATIONS:  Corey Palmer is a  42 y.o. male who has a complex past medical history, with pacemaker, unable to have an MRI had persistent right knee grinding and pain, and elected for surgical management after failing nonsurgical management.   The risks benefits and alternatives were discussed with the patient preoperatively including but not limited to the risks of infection, bleeding, nerve injury, cardiopulmonary complications, the need for revision surgery, among others, and the patient was willing to proceed.  We also discussed the risks for persistent pain, persistent grinding, failure to improve symptoms, among others.  OPERATIVE IMPLANTS: None   OPERATIVE FINDINGS:  the knee had full motion during exam under anesthesia without significant effusion. He was stable to ligamentous exam to varus and valgus and Lachman maneuver with a negative dial test and negative drawer and negative pivot shift.  The articular cartilage had some grade 2 changes under the patella, with a diffuse coating on the articular cartilage of a chondrocalcinosis type appearance. This was fairly abnormal. The medial and lateral gutters were intact, and normal, without loose body, the lateral meniscus had some of the same chondrocalcinosis-type material overlying it. The anterior cruciate ligament and PCL were intact, although the anterior cruciate ligament was slightly lax, but I think that it was normally positioned, and took on tension with Lachman testing. The medial meniscus  and lateral meniscus were all totally intact, and stable to probing.   OPERATIVE PROCEDURE:  the patient was brought to the operating room and placed in supine position. General anesthesia was administered. IV antibiotics were not given do to his anaphylactic reaction to penicillin.. The right lower extremity was prepped and draped in usual sterile fashion. Time out was performed. Diagnostic arthroscopy was carried out the above-named findings using an anteromedial and anterolateral portal. Arthroscopic shaver was used to debride the undersurface of the patella back to a stable configuration, and also removed as much of the chondrocalcinosis type material as possible. Complete diagnostic arthroscopy was performed. I did shave slightly the lateral meniscus as well as some of the calcinosis debris that was floating around the knee, closely inspected the gutters, and then removed the arthroscopic instruments and repaired the portals with Monocryl after draining the knee. The portals and knee were injected with Marcaine, and sterile gauze and Steri-Strips were applied and the patient was awakened and returned to the [facility] in stable and satisfactory condition. There were no complications and he tolerated the procedure well

## 2013-06-11 NOTE — Transfer of Care (Signed)
Immediate Anesthesia Transfer of Care Note  Patient: Corey Palmer  Procedure(s) Performed: Procedure(s): RIGHT KNEE ARTHROSCOPY with chondroplasty (Right)  Patient Location: PACU  Anesthesia Type:General  Level of Consciousness: awake, alert  and oriented  Airway & Oxygen Therapy: Patient Spontanous Breathing and Patient connected to face mask oxygen  Post-op Assessment: Report given to PACU RN, Post -op Vital signs reviewed and stable and Patient moving all extremities X 4  Post vital signs: Reviewed and stable  Complications: No apparent anesthesia complications

## 2013-06-11 NOTE — Discharge Instructions (Signed)
Diet: As you were doing prior to hospitalization   Shower:  May shower but keep the wounds dry, use an occlusive plastic wrap, NO SOAKING IN TUB.  If the bandage gets wet, change with a clean dry gauze.  Dressing:  You may change your dressing 3-5 days after surgery.  Then change the dressing daily with sterile gauze dressing.    There are sticky tapes (steri-strips) on your wounds and all the stitches are absorbable.  Leave the steri-strips in place when changing your dressings, they will peel off with time, usually 2-3 weeks.  Activity:  Increase activity slowly as tolerated, but follow the weight bearing instructions below.  No lifting or driving for 6 weeks.  Weight Bearing:   As tolerated.    To prevent constipation: you may use a stool softener such as -  Colace (over the counter) 100 mg by mouth twice a day  Drink plenty of fluids (prune juice may be helpful) and high fiber foods Miralax (over the counter) for constipation as needed.    Itching:  If you experience itching with your medications, try taking only a single pain pill, or even half a pain pill at a time.  You may take up to 10 pain pills per day, and you can also use benadryl over the counter for itching or also to help with sleep.   Precautions:  If you experience chest pain or shortness of breath - call 911 immediately for transfer to the hospital emergency department!!  If you develop a fever greater that 101 F, purulent drainage from wound, increased redness or drainage from wound, or calf pain -- Call the office at [phone]                                                Follow- Up Appointment:  Please call for an appointment to be seen in 2 weeks [place] - [phone]

## 2013-06-11 NOTE — Anesthesia Procedure Notes (Addendum)
Performed by: Gaylene Brooks   Procedure Name: LMA Insertion Date/Time: 06/11/2013 12:45 PM Performed by: Gaylene Brooks Patient Re-evaluated:Patient Re-evaluated prior to inductionOxygen Delivery Method: Circle system utilized Preoxygenation: Pre-oxygenation with 100% oxygen Intubation Type: IV induction LMA: LMA inserted LMA Size: 5.0 Number of attempts: 1 Placement Confirmation: positive ETCO2,  CO2 detector and breath sounds checked- equal and bilateral Tube secured with: Tape Dental Injury: Teeth and Oropharynx as per pre-operative assessment

## 2013-06-11 NOTE — Anesthesia Preprocedure Evaluation (Addendum)
Anesthesia Evaluation  Patient identified by MRN, date of birth, ID band Patient awake    Reviewed: Allergy & Precautions, H&P , NPO status , Patient's Chart, lab work & pertinent test results  History of Anesthesia Complications Negative for: history of anesthetic complications  Airway       Dental   Pulmonary sleep apnea ,          Cardiovascular hypertension, + angina + CAD, + Past MI and +CHF + dysrhythmias + pacemaker + Cardiac Defibrillator  Echo (04/2013):  Mild LVH, EF 20-25%, mild LAE, mild to mod RVE with mild reduced RVSF.   Neuro/Psych PSYCHIATRIC DISORDERS Anxiety Depression    GI/Hepatic PUD, GERD-  ,  Endo/Other  diabetes  Renal/GU      Musculoskeletal   Abdominal   Peds  Hematology   Anesthesia Other Findings   Reproductive/Obstetrics                          Anesthesia Physical Anesthesia Plan  ASA: III  Anesthesia Plan: General   Post-op Pain Management:    Induction: Intravenous  Airway Management Planned: LMA  Additional Equipment:   Intra-op Plan:   Post-operative Plan: Extubation in OR  Informed Consent: I have reviewed the patients History and Physical, chart, labs and discussed the procedure including the risks, benefits and alternatives for the proposed anesthesia with the patient or authorized representative who has indicated his/her understanding and acceptance.   Dental advisory given  Plan Discussed with: CRNA, Anesthesiologist and Surgeon  Anesthesia Plan Comments:        Anesthesia Quick Evaluation

## 2013-06-11 NOTE — H&P (Signed)
PREOPERATIVE H&P  Chief Complaint: LOOSE BODY AND CHRONDROMALASIA RIGHT KNEE   HPI: Corey Palmer is a 43 y.o. male who presents for preoperative history and physical with a diagnosis of [place] . Symptoms are rated as moderate to severe, and have been worsening.  This is significantly impairing activities of daily living.  He has elected for surgical management. He rates his pain at 10.5/10, with popping and crepitance. He's tried braces, ibuprofen, exercises, with persistent pain. He had an injection. He cannot have an MRI do to a pacemaker, and is miserable, and wants something done about the knee.  Past Medical History  Diagnosis Date  . Dilated cardiomyopathy     status post ICD placement in 2008 c/b tearing at the atrial junction resulting in tamponade and urgent thoracotomy  . Gout, unspecified   . Dyslipidemia   . Hypertension   . Diabetes uncomplicated adult-type II     on insulin therapy, a1c 12.9 in 01/2011  . Chronic systolic heart failure     2D echo (123456) - Systolic function severely reduced., LV EF 20-25%. Akinesis of apical and anteroseptal mycoardium.  last 2D echo (11/2008 ), LV EF 25-30%, diffuse hypokinesis, and grade 1 diastolic dysfunction.  Marland Kitchen CAD (coronary artery disease)     Cardiac cath (08/19/2000) - Nonobstructive, 40% stenosis of LAD,.   . Paroxysmal atrial fibrillation     on chronic coumadin, goal INR 2-3. status post direct current  . Obesity (BMI 30.0-34.9)   . Hepatic steatosis 2011    seen on ultrasound.   . Anginal pain   . Myocardial infarction 2008  . ICD (implantable cardiac defibrillator) in place 2007  . Shortness of breath   . Pacemaker   . CHF (congestive heart failure)   . Cancer 2013    benign stomach cancer  . Peptic ulcer   . Hx of echocardiogram     Echo (04/2013):  Mild LVH, EF 20-25%, mild LAE, mild to mod RVE with mild reduced RVSF.  Marland Kitchen Automatic implantable cardioverter-defibrillator in situ      Medtronic   . Dysrhythmia   . Sleep apnea     does not have CPAP  . Bipolar disorder   . Depression   . Anxiety   . History of kidney stones   . GERD (gastroesophageal reflux disease)   . Arthritis    Past Surgical History  Procedure Laterality Date  . Icd placement    . Cardiac pacemaker placement    . Left total knee replacement    . Cholecystectomy    . Esophagogastroduodenoscopy  04/05/2011    Procedure: ESOPHAGOGASTRODUODENOSCOPY (EGD);  Surgeon: Irene Shipper, MD;  Location: [facility] ENDOSCOPY;  Service: Endoscopy;  Laterality: N/A;  . Joint replacement      left knee replacement  . Cardiac catheterization    . Colonoscopy    . Coronary artery bypass graft     History   Social History  . Marital Status: Married    Spouse Name: N/A    Number of Children: 3  . Years of Education: college   Occupational History  . bar tender    Social History Main Topics  . Smoking status: Never Smoker   . Smokeless tobacco: Never Used  . Alcohol Use: No  . Drug Use: No  . Sexual Activity: Not on file   Other Topics Concern  . Not on file   Social History Narrative   Lives in [place].  Studied sports medicine in college, got a BS - used to work with the [employer].         Family History  Problem Relation Age of Onset  . Diabetes Mother   . Coronary artery disease Mother 17    Died of MI at 6  . Gout Mother   . Heart disease Mother   . Heart attack Mother   . Diabetes Father   . Coronary artery disease Father 63    Died in a house fire   . Heart disease Father   . Heart disease Brother    Allergies  Allergen Reactions  . Orange Fruit Anaphylaxis  . Penicillins Anaphylaxis   Prior to Admission medications   Medication Sig Start Date End Date Taking? Authorizing Provider  amLODipine (NORVASC) 5 MG tablet Take 1 tablet (5 mg total) by mouth daily. 05/05/13 01/11/15 Yes Scott Joylene Draft, PA-C  apixaban (ELIQUIS) 5 MG TABS tablet Take 1 tablet (5 mg total) by mouth 2  (two) times daily. 05/11/13  Yes Larey Dresser, MD  atorvastatin (LIPITOR) 20 MG tablet Take 1 tablet (20 mg total) by mouth daily at 6 PM. 05/05/13 01/11/15 Yes Scott Joylene Draft, PA-C  colchicine 0.6 MG tablet Take 0.6 mg by mouth daily as needed (for gout).    Yes Historical Provider, MD  digoxin (LANOXIN) 0.125 MG tablet Take 1 tablet (0.125 mg total) by mouth daily. 05/05/13 12/27/14 Yes Scott Joylene Draft, PA-C  divalproex (DEPAKOTE) 500 MG DR tablet Take 500 mg by mouth 2 (two) times daily.   Yes Historical Provider, MD  FLUoxetine (PROZAC) 20 MG capsule Take 20 mg by mouth 2 (two) times daily.   Yes Historical Provider, MD  furosemide (LASIX) 40 MG tablet Take 1.5 tablets (60 mg total) by mouth 2 (two) times daily. 05/05/13 10/01/14 Yes Scott T Kathlen Mody, PA-C  hydrOXYzine (ATARAX/VISTARIL) 25 MG tablet Take 25-50 mg by mouth 2 (two) times daily as needed for anxiety (Takes 1 tablet daily as needed and takes 1-2 tablets every night at bedtime as needed.).    Yes Historical Provider, MD  insulin aspart (NOVOLOG) 100 UNIT/ML injection Inject 4-15 Units into the skin 3 (three) times daily as needed for high blood sugar (per sliding scale).   Yes Historical Provider, MD  Insulin Glargine (LANTUS) 100 UNIT/ML Solostar Pen Inject 130 Units into the skin every morning. 05/26/13  Yes Renato Shin, MD  lisinopril (PRINIVIL,ZESTRIL) 40 MG tablet Take 1 tablet (40 mg total) by mouth daily. 05/05/13  Yes Scott Joylene Draft, PA-C  metoprolol (TOPROL XL) 200 MG 24 hr tablet Take 1 tablet (200 mg total) by mouth daily. 05/06/13 11/29/14 Yes Evans Lance, MD  Multiple Vitamin CHEW Chew 1 tablet by mouth daily.    Yes Historical Provider, MD  nitroGLYCERIN (NITROSTAT) 0.4 MG SL tablet Place 0.4 mg under the tongue every 5 (five) minutes as needed for chest pain.   Yes Historical Provider, MD  omega-3 acid ethyl esters (LOVAZA) 1 G capsule Take 2 capsules (2 g total) by mouth 2 (two) times daily. 05/05/13  Yes Scott Joylene Draft, PA-C   omeprazole (PRILOSEC) 40 MG capsule Take 40 mg by mouth daily.   Yes Historical Provider, MD  pantoprazole (PROTONIX) 40 MG tablet Take 40 mg by mouth 2 (two) times daily.   Yes Historical Provider, MD  potassium chloride SA (K-DUR,KLOR-CON) 20 MEQ tablet Take 2 tablets (40 mEq total) by mouth daily. 05/05/13 05/05/14 Yes Liliane Shi, PA-C  spironolactone (ALDACTONE) 25 MG tablet Take 1 tablet (25 mg total) by mouth daily. 05/05/13 05/05/14 Yes Scott T Weaver, PA-C  traMADol (ULTRAM) 50 MG tablet Take 50 mg by mouth every 6 (six) hours as needed for moderate pain.   Yes Historical Provider, MD  ACCU-CHEK FASTCLIX LANCETS MISC 1 each by Does not apply route 2 (two) times daily. 10/25/11   Renato Shin, MD     Positive ROS: All other systems have been reviewed and were otherwise negative with the exception of those mentioned in the HPI and as above.  Physical Exam: General: Alert, no acute distress Cardiovascular: No pedal edema Respiratory: No cyanosis, no use of accessory musculature GI: No organomegaly, abdomen is soft and non-tender Skin: No lesions in the area of chief complaint Neurologic: Sensation intact distally Psychiatric: Patient is competent for consent with normal mood and affect Lymphatic: No axillary or cervical lymphadenopathy  MUSCULOSKELETAL: Right knee has normal stability, range of motion 0-90, pain to palpation diffusely, positive patellar grind.  Assessment: LOOSE BODY AND CHRONDROMALASIA RIGHT KNEE   Plan: Plan for Procedure(s): RIGHT KNEE ARTHROSCOPY LOOSE BODY REMOVAL AND DEBRIDEMENT   The risks benefits and alternatives were discussed with the patient including but not limited to the risks of nonoperative treatment, versus surgical intervention including infection, bleeding, nerve injury,  blood clots, cardiopulmonary complications, morbidity, mortality, among others, and they were willing to proceed. We also discussed at length the fact that this is somewhat of a  diagnostic arthroscopy, as without an MRI I cannot provide a predictive summary of how much improvement we will be able to achieve. He understands this, and is willing to proceed.  Johnny Bridge, MD Cell [phone]   06/11/2013 7:30 AM

## 2013-06-11 NOTE — Anesthesia Postprocedure Evaluation (Signed)
  Anesthesia Post-op Note  Patient: Corey Palmer  Procedure(s) Performed: Procedure(s): RIGHT KNEE ARTHROSCOPY with chondroplasty (Right)  Patient Location: PACU  Anesthesia Type:General  Level of Consciousness: awake, alert  and oriented  Airway and Oxygen Therapy: Patient Spontanous Breathing  Post-op Pain: mild  Post-op Assessment: Post-op Vital signs reviewed  Post-op Vital Signs: Reviewed  Last Vitals:  Filed Vitals:   06/11/13 1530  BP:   Pulse: 68  Temp:   Resp: 12    Complications: No apparent anesthesia complications

## 2013-06-12 LAB — GLUCOSE, CAPILLARY
GLUCOSE-CAPILLARY: 208 mg/dL — AB (ref 70–99)
GLUCOSE-CAPILLARY: 209 mg/dL — AB (ref 70–99)

## 2013-06-12 NOTE — Evaluation (Addendum)
Physical Therapy Evaluation and Discharge Patient Details Name: Corey Palmer MRN: [id number] DOB: 08-10-1970 Today's Date: 06/12/2013   History of Present Illness  43 y.o. male s/p right knee arthroplasty with chondroplasty. Complex history of cardiovascular issues. (see H&P for details)  Clinical Impression  Patient evaluated by Physical Therapy with no further acute PT needs identified. All education has been completed and the patient has no further questions. Pt would benefit from use of rolling walker for short period of time for safe mobility. Recommend outpatient PT follow-up if cleared by orthopedic surgeon, to improve strength and range of motion for improved functional mobility. See below for any follow-up Physial Therapy or equipment needs. PT is signing off. Thank you for this referral.     Follow Up Recommendations Outpatient PT    Equipment Recommendations  Rolling walker with 5" wheels    Recommendations for Other Services       Precautions / Restrictions Precautions Precautions: Knee Precaution Comments: Reviewed verablly Restrictions Weight Bearing Restrictions: Yes RLE Weight Bearing: Weight bearing as tolerated      Mobility  Bed Mobility               General bed mobility comments: pt in recliner at start/end of therapy  Transfers Overall transfer level: Modified independent Equipment used: Rolling walker (2 wheeled)             General transfer comment: Mod I with sit<>stand with and without RW. Pt with better stability using RW. No cues needed  Ambulation/Gait Ambulation/Gait assistance: Supervision Ambulation Distance (Feet): 520 Feet Assistive device: Rolling walker (2 wheeled) Gait Pattern/deviations: Antalgic;Decreased stance time - right     General Gait Details: Pt ambulating generally well. Improved safety and stability with rolling walker. Cues for forward gaze. Mild knee instability with notable antalgic gait.  Stairs             Wheelchair Mobility    Modified Rankin (Stroke Patients Only)       Balance Overall balance assessment: Modified Independent                                           Pertinent Vitals/Pain Pt reports pain as minimal Patient repositioned in chair for comfort.     Home Living Family/patient expects to be discharged to:: Private residence Living Arrangements: Spouse/significant other Available Help at Discharge: Family;Available 24 hours/day Type of Home: House Home Access: Level entry     Home Layout: One level Home Equipment: Shower seat      Prior Function Level of Independence: Independent with assistive device(s)         Comments: Use of cane for ambulation     Hand Dominance   Dominant Hand: Right    Extremity/Trunk Assessment   Upper Extremity Assessment: Overall WFL for tasks assessed           Lower Extremity Assessment: RLE deficits/detail RLE Deficits / Details: decreased strength and ROM, limited assessement by pain       Communication   Communication: No difficulties  Cognition Arousal/Alertness: Awake/alert Behavior During Therapy: WFL for tasks assessed/performed Overall Cognitive Status: Within Functional Limits for tasks assessed                      General Comments General comments (skin integrity, edema, etc.): Reviewed therapeutic exercises. Discussed options for assistive devices, and follow-up  therapy. Pt believes he will benefit from using a RW for a period of time until he regains full strength and improves stability of knee. Agreed that he will speak with his surgeon regarding possible outpatient physical therapy to improve strength and ROM.    Exercises        Assessment/Plan    PT Assessment All further PT needs can be met in the next venue of care  PT Diagnosis Abnormality of gait;Acute pain   PT Problem List Decreased strength;Decreased range of motion;Decreased balance;Decreased  mobility;Impaired sensation;Obesity;Pain  PT Treatment Interventions     PT Goals (Current goals can be found in the Care Plan section) Acute Rehab PT Goals Patient Stated Goal: be able to walk better PT Goal Formulation: No goals set, d/c therapy    Frequency     Barriers to discharge        Co-evaluation               End of Session   Activity Tolerance: Patient tolerated treatment well Patient left: in chair;with call bell/phone within reach;with family/visitor present Nurse Communication: Mobility status;Other (comment) (pt request fan. Order RW.)    Functional Assessment Tool Used: clinical judgement Functional Limitation: Mobility: Walking and moving around Mobility: Walking and Moving Around Current Status [phone]): At least 1 percent but less than 20 percent impaired, limited or restricted Mobility: Walking and Moving Around Goal Status [phone]): At least 1 percent but less than 20 percent impaired, limited or restricted Mobility: Walking and Moving Around Discharge Status [phone]): At least 1 percent but less than 20 percent impaired, limited or restricted    Time: 0830-0855 PT Time Calculation (min): 25 min   Charges:   PT Evaluation $Initial PT Evaluation Tier I: 1 Procedure PT Treatments $Self Care/Home Management: 8-22   PT G Codes:   Functional Assessment Tool Used: clinical judgement Functional Limitation: Mobility: Walking and moving around   [place], [place] Henderson 06/12/2013, 9:45 AM  Addendum for "discharge" in title of note

## 2013-06-12 NOTE — Discharge Summary (Signed)
PATIENT ID: Corey Palmer        MRN:  [id number]          DOB/AGE: [birth year] / 43 y.o.    DISCHARGE SUMMARY  ADMISSION DATE:    06/11/2013 DISCHARGE DATE:   06/12/2013   ADMISSION DIAGNOSIS: LOOSE BODY AND CHRONDROMALASIA RIGHT KNEE     DISCHARGE DIAGNOSIS:  LOOSE BODY AND CHRONDROMALASIA RIGHT KNEE     ADDITIONAL DIAGNOSIS: Principal Problem:   Chondrocalcinosis of right knee Active Problems:   Chondromalacia of left knee  Past Medical History  Diagnosis Date  . Dilated cardiomyopathy     status post ICD placement in 2008 c/b tearing at the atrial junction resulting in tamponade and urgent thoracotomy  . Gout, unspecified   . Dyslipidemia   . Hypertension   . Diabetes uncomplicated adult-type II     on insulin therapy, a1c 12.9 in 01/2011  . Chronic systolic heart failure     2D echo (123456) - Systolic function severely reduced., LV EF 20-25%. Akinesis of apical and anteroseptal mycoardium.  last 2D echo (11/2008 ), LV EF 25-30%, diffuse hypokinesis, and grade 1 diastolic dysfunction.  Marland Kitchen CAD (coronary artery disease)     Cardiac cath (08/19/2000) - Nonobstructive, 40% stenosis of LAD,.   . Paroxysmal atrial fibrillation     on chronic coumadin, goal INR 2-3. status post direct current  . Obesity (BMI 30.0-34.9)   . Hepatic steatosis 2011    seen on ultrasound.   . Anginal pain   . Myocardial infarction 2008  . ICD (implantable cardiac defibrillator) in place 2007  . Shortness of breath   . Pacemaker   . CHF (congestive heart failure)   . Cancer 2013    benign stomach cancer  . Peptic ulcer   . Hx of echocardiogram     Echo (04/2013):  Mild LVH, EF 20-25%, mild LAE, mild to mod RVE with mild reduced RVSF.  Marland Kitchen Automatic implantable cardioverter-defibrillator in situ     Medtronic   . Dysrhythmia   . Sleep apnea     does not have CPAP  . Bipolar disorder   . Depression   . Anxiety   . History of kidney stones   . GERD (gastroesophageal reflux disease)   .  Arthritis   . Chondrocalcinosis of right knee 06/11/2013    PROCEDURE: Procedure(s): RIGHT KNEE ARTHROSCOPY with chondroplasty Right on 06/11/2013  CONSULTS: none     HISTORY:  See H&P in chart  HOSPITAL COURSE:  Corey Palmer is a 43 y.o. admitted on 06/11/2013 and found to have a diagnosis of [place] .  After appropriate laboratory studies were obtained  they were taken to the operating room on 06/11/2013 and underwent  Procedure(s): RIGHT KNEE ARTHROSCOPY with chondroplasty  Right.   They were given perioperative antibiotics:  Anti-infectives   None    .  Tolerated the procedure well.    POD #1, allowed out of bed as tolerated, WBAT.  IV saline locked.  O2 discontionued.  The remainder of the hospital course was dedicated to ambulation and strengthening.   The patient was discharged on 1 Day Post-Op in  Good condition.  Blood products given:none  DIAGNOSTIC STUDIES: Recent vital signs: Patient Vitals for the past 24 hrs:  BP Temp Temp src Pulse Resp SpO2  06/12/13 0519 117/67 mmHg 97.9 F (36.6 C) Oral 74 18 99 %  06/11/13 2107 119/74 mmHg 97.8 F (36.6 C) Oral 71  16 99 %  06/11/13 1622 130/75 mmHg 97.6 F (36.4 C) Oral 67 18 98 %  06/11/13 1550 - 97.4 F (36.3 C) - - - -  06/11/13 1549 - - - 70 18 100 %  06/11/13 1548 131/82 mmHg - - 66 8 100 %  06/11/13 1545 - - - 69 15 97 %  06/11/13 1530 - - - 68 12 99 %  06/11/13 1521 144/92 mmHg - - 70 18 100 %  06/11/13 1504 126/79 mmHg - - 69 16 96 %  06/11/13 1500 - - - 67 9 98 %  06/11/13 1448 121/71 mmHg - - 69 13 97 %  06/11/13 1445 - - - 69 13 95 %  06/11/13 1433 128/81 mmHg - - 68 11 96 %  06/11/13 1430 - - - 72 14 100 %  06/11/13 1418 122/71 mmHg - - 70 14 95 %  06/11/13 1415 - - - 70 13 100 %  06/11/13 1400 127/72 mmHg 98.3 F (36.8 C) - 75 13 95 %       Recent laboratory studies:  Recent Labs  06/08/13 0926  WBC 6.5  HGB 13.9  HCT 40.9  PLT 173.0    Recent Labs   06/08/13 0926  NA 133*  K 3.8  CL 98  CO2 27  BUN 25*  CREATININE 1.3  GLUCOSE 386*  CALCIUM 8.8   Lab Results  Component Value Date   INR 1.3 05/11/2013   INR 1.1 04/21/2013   INR 1.4 04/13/2013     Recent Radiographic Studies :  Dg Chest 2 View  06/10/2013   CLINICAL DATA:  Preop for right knee arthroscopy  EXAM: CHEST  2 VIEW  COMPARISON:  11/01/2011  FINDINGS: Borderline cardiomegaly. Status post median sternotomy. Single lead cardiac pacemaker is unchanged in position. No acute infiltrate or pleural effusion. No pulmonary edema. Minimal degenerative changes lower thoracic spine.  IMPRESSION: No active cardiopulmonary disease.  Cardiomegaly again noted.   Electronically Signed   By: Lahoma Crocker M.D.   On: 06/10/2013 14:01    DISCHARGE INSTRUCTIONS: Discharge Instructions   Call MD / Call 911    Complete by:  As directed   If you experience chest pain or shortness of breath, CALL 911 and be transported to the hospital emergency room.  If you develope a fever above 101 F, pus (white drainage) or increased drainage or redness at the wound, or calf pain, call your surgeon's office.     Constipation Prevention    Complete by:  As directed   Drink plenty of fluids.  Prune juice may be helpful.  You may use a stool softener, such as Colace (over the counter) 100 mg twice a day.  Use MiraLax (over the counter) for constipation as needed.     Diet general    Complete by:  As directed      Discharge instructions    Complete by:  As directed   Change dressing in 3 days and reapply fresh dressing, unless you have a splint (half cast).  If you have a splint/cast, just leave in place until your follow-up appointment.    Keep wounds dry for 3 weeks.  Leave steri-strips in place on skin.  Do not apply lotion or anything to the wound.     Weight bearing as tolerated    Complete by:  As directed            DISCHARGE MEDICATIONS:     Medication List  ACCU-CHEK FASTCLIX LANCETS  Misc  1 each by Does not apply route 2 (two) times daily.     amLODipine 5 MG tablet  Commonly known as:  NORVASC  Take 1 tablet (5 mg total) by mouth daily.     apixaban 5 MG Tabs tablet  Commonly known as:  ELIQUIS  Take 1 tablet (5 mg total) by mouth 2 (two) times daily.     atorvastatin 20 MG tablet  Commonly known as:  LIPITOR  Take 1 tablet (20 mg total) by mouth daily at 6 PM.     colchicine 0.6 MG tablet  Take 0.6 mg by mouth daily as needed (for gout).     digoxin 0.125 MG tablet  Commonly known as:  LANOXIN  Take 1 tablet (0.125 mg total) by mouth daily.     divalproex 500 MG DR tablet  Commonly known as:  DEPAKOTE  Take 500 mg by mouth 2 (two) times daily.     FLUoxetine 20 MG capsule  Commonly known as:  PROZAC  Take 20 mg by mouth 2 (two) times daily.     furosemide 40 MG tablet  Commonly known as:  LASIX  Take 1.5 tablets (60 mg total) by mouth 2 (two) times daily.     hydrOXYzine 25 MG tablet  Commonly known as:  ATARAX/VISTARIL  Take 25-50 mg by mouth 2 (two) times daily as needed for anxiety (Takes 1 tablet daily as needed and takes 1-2 tablets every night at bedtime as needed.).     insulin aspart 100 UNIT/ML injection  Commonly known as:  novoLOG  Inject 4-15 Units into the skin 3 (three) times daily as needed for high blood sugar (per sliding scale).     Insulin Glargine 100 UNIT/ML Solostar Pen  Commonly known as:  LANTUS  Inject 130 Units into the skin every morning.     lisinopril 40 MG tablet  Commonly known as:  PRINIVIL,ZESTRIL  Take 1 tablet (40 mg total) by mouth daily.     methocarbamol 500 MG tablet  Commonly known as:  ROBAXIN  Take 1 tablet (500 mg total) by mouth 4 (four) times daily.     metoprolol 200 MG 24 hr tablet  Commonly known as:  TOPROL XL  Take 1 tablet (200 mg total) by mouth daily.     Multiple Vitamin Chew  Chew 1 tablet by mouth daily.     nitroGLYCERIN 0.4 MG SL tablet  Commonly known as:  NITROSTAT  Place  0.4 mg under the tongue every 5 (five) minutes as needed for chest pain.     omega-3 acid ethyl esters 1 G capsule  Commonly known as:  LOVAZA  Take 2 capsules (2 g total) by mouth 2 (two) times daily.     omeprazole 40 MG capsule  Commonly known as:  PRILOSEC  Take 40 mg by mouth daily.     oxyCODONE-acetaminophen 10-325 MG per tablet  Commonly known as:  PERCOCET  Take 1-2 tablets by mouth every 6 (six) hours as needed for pain. MAXIMUM TOTAL ACETAMINOPHEN DOSE IS 4000 MG PER DAY     pantoprazole 40 MG tablet  Commonly known as:  PROTONIX  Take 40 mg by mouth 2 (two) times daily.     potassium chloride SA 20 MEQ tablet  Commonly known as:  K-DUR,KLOR-CON  Take 2 tablets (40 mEq total) by mouth daily.     promethazine 25 MG tablet  Commonly known as:  PHENERGAN  Take 1 tablet (  25 mg total) by mouth every 6 (six) hours as needed for nausea or vomiting.     sennosides-docusate sodium 8.6-50 MG tablet  Commonly known as:  SENOKOT-S  Take 2 tablets by mouth daily.     spironolactone 25 MG tablet  Commonly known as:  ALDACTONE  Take 1 tablet (25 mg total) by mouth daily.     traMADol 50 MG tablet  Commonly known as:  ULTRAM  Take 50 mg by mouth every 6 (six) hours as needed for moderate pain.        FOLLOW UP VISIT:       Follow-up Information   Follow up with Johnny Bridge, MD. Schedule an appointment as soon as possible for a visit in 2 weeks.   Specialty:  Orthopedic Surgery   Contact information:   [place] [address] [phone]       DISPOSITION:   Home  CONDITION:  Veto [place] 06/12/2013, 9:28 AM

## 2013-06-12 NOTE — Care Management Note (Signed)
    Page 1 of 1   06/12/2013     10:43:02 AM CARE MANAGEMENT NOTE 06/12/2013  Patient:  Corey Palmer, Corey Palmer   Account Number:  [id number]  Date Initiated:  06/12/2013  Documentation initiated by:  [facility]  Subjective/Objective Assessment:   adm: R knee arthroplasty     Action/Plan:   discharge planning   Anticipated DC Date:  06/12/2013   Anticipated DC Plan:  HOME/SELF CARE         Choice offered to / List presented to:             Status of service:  Completed, signed off Medicare Important Message given?   (If response is "NO", the following Medicare IM given date fields will be blank) Date Medicare IM given:   Date Additional Medicare IM given:    Discharge Disposition:  HOME/SELF CARE  Per UR Regulation:    If discussed at Long Length of Stay Meetings, dates discussed:    Comments:  06/12/13 10:40 CM received call to arrange for RW for pt. Order already placed by MD.  CM called DME delivery rep and RW will be delivered to room prior to discharge.  No other CM needs were communicated.  Mariane Masters, BSN, CM [phone].

## 2013-06-15 ENCOUNTER — Encounter (HOSPITAL_COMMUNITY): Payer: Self-pay | Admitting: Orthopedic Surgery

## 2013-06-30 ENCOUNTER — Telehealth (HOSPITAL_COMMUNITY): Payer: Self-pay | Admitting: Cardiology

## 2013-06-30 NOTE — Telephone Encounter (Signed)
Attempting to schedule follow up- due in June No answer left message on machine

## 2013-07-01 ENCOUNTER — Encounter: Payer: Medicaid Other | Admitting: Internal Medicine

## 2013-07-02 ENCOUNTER — Encounter: Payer: Self-pay | Admitting: Internal Medicine

## 2013-07-08 ENCOUNTER — Ambulatory Visit: Payer: Medicaid Other | Admitting: Cardiology

## 2013-08-09 ENCOUNTER — Ambulatory Visit: Payer: Medicaid Other | Admitting: Endocrinology

## 2013-08-13 ENCOUNTER — Other Ambulatory Visit: Payer: Self-pay

## 2013-08-16 ENCOUNTER — Other Ambulatory Visit: Payer: Self-pay

## 2013-08-16 MED ORDER — INSULIN PEN NEEDLE 32G X 4 MM MISC: Status: DC

## 2013-08-18 ENCOUNTER — Other Ambulatory Visit: Payer: Self-pay

## 2013-08-18 MED ORDER — PANTOPRAZOLE SODIUM 40 MG PO TBEC: 40.0000 mg | DELAYED_RELEASE_TABLET | Freq: Two times a day (BID) | ORAL | Status: DC

## 2013-08-18 MED ORDER — OMEPRAZOLE 40 MG PO CPDR: 40.0000 mg | DELAYED_RELEASE_CAPSULE | Freq: Every day | ORAL | Status: AC

## 2013-09-21 ENCOUNTER — Ambulatory Visit (INDEPENDENT_AMBULATORY_CARE_PROVIDER_SITE_OTHER): Payer: Medicaid Other | Admitting: Internal Medicine

## 2013-09-21 ENCOUNTER — Encounter: Payer: Self-pay | Admitting: Internal Medicine

## 2013-09-21 VITALS — BP 172/102 | HR 97 | Ht 75.0 in | Wt 263.6 lb

## 2013-09-21 DIAGNOSIS — I4891 Unspecified atrial fibrillation: Secondary | ICD-10-CM

## 2013-09-21 DIAGNOSIS — I5043 Acute on chronic combined systolic (congestive) and diastolic (congestive) heart failure: Secondary | ICD-10-CM

## 2013-09-21 DIAGNOSIS — I509 Heart failure, unspecified: Secondary | ICD-10-CM

## 2013-09-21 DIAGNOSIS — I10 Essential (primary) hypertension: Secondary | ICD-10-CM

## 2013-09-21 DIAGNOSIS — I480 Paroxysmal atrial fibrillation: Secondary | ICD-10-CM

## 2013-09-21 DIAGNOSIS — Z95810 Presence of automatic (implantable) cardiac defibrillator: Secondary | ICD-10-CM

## 2013-09-21 DIAGNOSIS — I5022 Chronic systolic (congestive) heart failure: Secondary | ICD-10-CM

## 2013-09-21 DIAGNOSIS — I428 Other cardiomyopathies: Secondary | ICD-10-CM

## 2013-09-21 LAB — MDC_IDC_ENUM_SESS_TYPE_INCLINIC
Battery Voltage: 3.01 V
Brady Statistic RV Percent Paced: 0 %
HIGH POWER IMPEDANCE MEASURED VALUE: 75 Ohm
Lead Channel Impedance Value: 532 Ohm
Lead Channel Setting Pacing Amplitude: 2.5 V
Lead Channel Setting Pacing Pulse Width: 0.4 ms
Lead Channel Setting Sensing Sensitivity: 0.3 mV
MDC IDC MSMT LEADCHNL RV PACING THRESHOLD AMPLITUDE: 0.75 V
MDC IDC MSMT LEADCHNL RV PACING THRESHOLD PULSEWIDTH: 0.4 ms
MDC IDC MSMT LEADCHNL RV SENSING INTR AMPL: 30.25 mV
MDC IDC SESS DTM: 20150825142728
MDC IDC SET ZONE DETECTION INTERVAL: 250 ms
MDC IDC SET ZONE DETECTION INTERVAL: 360 ms
Zone Setting Detection Interval: 430 ms

## 2013-09-21 NOTE — Assessment & Plan Note (Signed)
His blood pressure is elevated today. I've asked the patient to follow his blood pressure checked on a regular basis. He is on high-dose beta blocker. He admits to missing his medications. I've asked him to remain compliant.

## 2013-09-21 NOTE — Assessment & Plan Note (Signed)
His chronic systolic heart failure is class II. I've encouraged the patient to reduce his salt intake, and not missing his medications. He admits to some dietary indiscretion. His fluid index is low. His weight is stable.

## 2013-09-21 NOTE — Progress Notes (Signed)
HPI Corey Palmer returns today after a long absence from our arrhythmia clinic. He is a 43 year old man with an ischemic cardiomyopathy, chronic systolic heart failure, ventricular tachycardia, paroxysmal atrial fibrillation, and obesity. In the interim, he has been stable. He admits to some shortness of breath but also to sodium indiscretion. He has not missed any of his medications. He notes blood pressure being elevated. He denies any ICD shocks.  Allergies  Allergen Reactions  . Orange Fruit Anaphylaxis  . Penicillins Anaphylaxis     Current Outpatient Prescriptions  Medication Sig Dispense Refill  . amLODipine (NORVASC) 5 MG tablet Take 1 tablet (5 mg total) by mouth daily.  30 tablet  11  . apixaban (ELIQUIS) 5 MG TABS tablet Take 1 tablet (5 mg total) by mouth 2 (two) times daily.  60 tablet  6  . atorvastatin (LIPITOR) 20 MG tablet Take 1 tablet (20 mg total) by mouth daily at 6 PM.  30 tablet  11  . colchicine 0.6 MG tablet Take 0.6 mg by mouth daily as needed (for gout).       Marland Kitchen digoxin (LANOXIN) 0.125 MG tablet Take 1 tablet (0.125 mg total) by mouth daily.  30 tablet  11  . divalproex (DEPAKOTE) 500 MG DR tablet Take 500 mg by mouth 2 (two) times daily.      Marland Kitchen FLUoxetine (PROZAC) 20 MG capsule Take 20 mg by mouth 2 (two) times daily.      . furosemide (LASIX) 40 MG tablet Take 1.5 tablets (60 mg total) by mouth 2 (two) times daily.  90 tablet  11  . hydrOXYzine (ATARAX/VISTARIL) 50 MG tablet Take 50 mg by mouth 3 (three) times daily.      . insulin aspart (NOVOLOG) 100 UNIT/ML injection Inject 4-15 Units into the skin 3 (three) times daily as needed for high blood sugar (per sliding scale).      . Insulin Glargine (LANTUS) 100 UNIT/ML Solostar Pen Inject 130 Units into the skin every morning.      Marland Kitchen lisinopril (PRINIVIL,ZESTRIL) 40 MG tablet Take 1 tablet (40 mg total) by mouth daily.  30 tablet  11  . methocarbamol (ROBAXIN) 500 MG tablet Take 1 tablet (500 mg total) by  mouth 4 (four) times daily.  75 tablet  1  . metoprolol (TOPROL XL) 200 MG 24 hr tablet Take 1 tablet (200 mg total) by mouth daily.  30 tablet  11  . Multiple Vitamin CHEW Chew 1 tablet by mouth daily.       . nitroGLYCERIN (NITROSTAT) 0.4 MG SL tablet Place 0.4 mg under the tongue every 5 (five) minutes as needed for chest pain.      Marland Kitchen omega-3 acid ethyl esters (LOVAZA) 1 G capsule Take 2 capsules (2 g total) by mouth 2 (two) times daily.  240 capsule  11  . omeprazole (PRILOSEC) 40 MG capsule Take 1 capsule (40 mg total) by mouth daily.  30 capsule  3  . pantoprazole (PROTONIX) 40 MG tablet Take 1 tablet (40 mg total) by mouth 2 (two) times daily.  60 tablet  3  . potassium chloride SA (K-DUR,KLOR-CON) 20 MEQ tablet Take 2 tablets (40 mEq total) by mouth daily.  60 tablet  11  . promethazine (PHENERGAN) 25 MG tablet Take 1 tablet (25 mg total) by mouth every 6 (six) hours as needed for nausea or vomiting.  30 tablet  0  . spironolactone (ALDACTONE) 25 MG tablet Take 1 tablet (25 mg  total) by mouth daily.  30 tablet  11  . traMADol (ULTRAM) 50 MG tablet Take 50 mg by mouth every 6 (six) hours as needed for moderate pain.       No current facility-administered medications for this visit.     Past Medical History  Diagnosis Date  . Dilated cardiomyopathy     status post ICD placement in 2008 c/b tearing at the atrial junction resulting in tamponade and urgent thoracotomy  . Gout, unspecified   . Dyslipidemia   . Hypertension   . Diabetes uncomplicated adult-type II     on insulin therapy, a1c 12.9 in 01/2011  . Chronic systolic heart failure     2D echo (123456) - Systolic function severely reduced., LV EF 20-25%. Akinesis of apical and anteroseptal mycoardium.  last 2D echo (11/2008 ), LV EF 25-30%, diffuse hypokinesis, and grade 1 diastolic dysfunction.  Marland Kitchen CAD (coronary artery disease)     Cardiac cath (08/19/2000) - Nonobstructive, 40% stenosis of LAD,.   . Paroxysmal atrial  fibrillation     on chronic coumadin, goal INR 2-3. status post direct current  . Obesity (BMI 30.0-34.9)   . Hepatic steatosis 2011    seen on ultrasound.   . Anginal pain   . Myocardial infarction 2008  . ICD (implantable cardiac defibrillator) in place 2007  . Shortness of breath   . Pacemaker   . CHF (congestive heart failure)   . Cancer 2013    benign stomach cancer  . Peptic ulcer   . Hx of echocardiogram     Echo (04/2013):  Mild LVH, EF 20-25%, mild LAE, mild to mod RVE with mild reduced RVSF.  Marland Kitchen Automatic implantable cardioverter-defibrillator in situ     Medtronic   . Dysrhythmia   . Sleep apnea     does not have CPAP  . Bipolar disorder   . Depression   . Anxiety   . History of kidney stones   . GERD (gastroesophageal reflux disease)   . Arthritis   . Chondrocalcinosis of right knee 06/11/2013    ROS:   All systems reviewed and negative except as noted in the HPI.   Past Surgical History  Procedure Laterality Date  . Icd placement    . Cardiac pacemaker placement    . Left total knee replacement    . Cholecystectomy    . Esophagogastroduodenoscopy  04/05/2011    Procedure: ESOPHAGOGASTRODUODENOSCOPY (EGD);  Surgeon: Irene Shipper, MD;  Location: [facility]COPY;  Service: Endoscopy;  Laterality: N/A;  . Joint replacement      left knee replacement  . Cardiac catheterization    . Colonoscopy    . Coronary artery bypass graft    . Knee arthroscopy Right 06/11/2013    Procedure: RIGHT KNEE ARTHROSCOPY with chondroplasty;  Surgeon: Johnny Bridge, MD;  Location: [facility];  Service: Orthopedics;  Laterality: Right;     Family History  Problem Relation Age of Onset  . Diabetes Mother   . Coronary artery disease Mother 87    Died of MI at 20  . Gout Mother   . Heart disease Mother   . Heart attack Mother   . Diabetes Father   . Coronary artery disease Father 46    Died in a house fire   . Heart disease Father   . Heart disease Brother      History    Social History  . Marital Status: Married    Spouse Name: N/A  Number of Children: 3  . Years of Education: college   Occupational History  . bar tender    Social History Main Topics  . Smoking status: Never Smoker   . Smokeless tobacco: Never Used  . Alcohol Use: No  . Drug Use: No  . Sexual Activity: Not on file   Other Topics Concern  . Not on file   Social History Narrative   Lives in [place].   Studied sports medicine in college, got a BS - used to work with the [employer].           BP 172/102  Pulse 97  Ht 6\' 3"  (1.905 m)  Wt 263 lb 9.6 oz (119.568 kg)  BMI 32.95 kg/m2  Physical Exam:  Well appearing  obese, middle-aged man,NAD HEENT: Unremarkable Neck:  7 cm  JVD, no thyromegally Back:  No CVA tenderness Lungs:  Clear except for basilar rales bilaterally. No increased work of breathing.  HEART:  Regular rate rhythm, no murmurs, no rubs, no clicks Abd:  soft, positive bowel sounds, no organomegally, no rebound, no guarding Ext:  2 plus pulses, 1+ peripheral  edema, no cyanosis, no clubbing Skin:  No rashes no nodules Neuro:  CN II through XII intact, motor grossly intact  DEVICE  Normal device function.  See PaceArt for details.   Assess/Plan:

## 2013-09-21 NOTE — Patient Instructions (Signed)
Your physician wants you to follow-up in: 12 months with Dr Knox Saliva will receive a reminder letter in the mail two months in advance. If you don't receive a letter, please call our office to schedule the follow-up appointment.  Remote monitoring is used to monitor your Pacemaker or ICD from home. This monitoring reduces the number of office visits required to check your device to one time per year. It allows [place] to keep an eye on the functioning of your device to ensure it is working properly. You are scheduled for a device check from home on 12/27/13. You may send your transmission at any time that day. If you have a wireless device, the transmission will be sent automatically. After your physician reviews your transmission, you will receive a postcard with your next transmission date.

## 2013-09-21 NOTE — Assessment & Plan Note (Signed)
His Medtronic ICD is working normally. We'll plan to recheck in several months. 

## 2013-09-23 ENCOUNTER — Other Ambulatory Visit: Payer: Medicaid Other

## 2013-09-24 ENCOUNTER — Encounter: Payer: Self-pay | Admitting: Internal Medicine

## 2013-09-24 ENCOUNTER — Telehealth: Payer: Self-pay | Admitting: *Deleted

## 2013-09-24 DIAGNOSIS — E108 Type 1 diabetes mellitus with unspecified complications: Secondary | ICD-10-CM

## 2013-09-24 NOTE — Telephone Encounter (Signed)
Left message on machine for patient to call office and schedule an appointment to follow up diabetes a1c diabetic bundle

## 2013-09-27 ENCOUNTER — Ambulatory Visit: Payer: Medicaid Other | Admitting: Family Medicine

## 2013-10-25 ENCOUNTER — Ambulatory Visit: Payer: Medicaid Other | Admitting: Cardiology

## 2013-10-25 ENCOUNTER — Encounter: Payer: Self-pay | Admitting: *Deleted

## 2013-10-26 ENCOUNTER — Ambulatory Visit: Payer: Medicaid Other | Admitting: Cardiology

## 2013-11-08 ENCOUNTER — Encounter: Payer: Self-pay | Admitting: Cardiology

## 2013-12-27 ENCOUNTER — Telehealth: Payer: Self-pay | Admitting: Cardiology

## 2013-12-27 ENCOUNTER — Encounter: Payer: Medicaid Other | Admitting: *Deleted

## 2013-12-27 NOTE — Telephone Encounter (Signed)
LMOVM reminding pt to send remote transmission.   

## 2013-12-31 ENCOUNTER — Encounter: Payer: Self-pay | Admitting: Cardiology

## 2014-01-25 ENCOUNTER — Encounter: Payer: Self-pay | Admitting: *Deleted

## 2014-02-25 ENCOUNTER — Encounter: Payer: Self-pay | Admitting: *Deleted

## 2014-03-24 ENCOUNTER — Encounter: Payer: Self-pay | Admitting: *Deleted

## 2014-04-28 ENCOUNTER — Encounter: Payer: Self-pay | Admitting: *Deleted

## 2014-05-25 ENCOUNTER — Encounter: Payer: Self-pay | Admitting: *Deleted

## 2017-08-07 ENCOUNTER — Emergency Department (HOSPITAL_BASED_OUTPATIENT_CLINIC_OR_DEPARTMENT_OTHER): Payer: Self-pay

## 2017-08-07 ENCOUNTER — Encounter (HOSPITAL_BASED_OUTPATIENT_CLINIC_OR_DEPARTMENT_OTHER): Payer: Self-pay | Admitting: Emergency Medicine

## 2017-08-07 ENCOUNTER — Other Ambulatory Visit: Payer: Self-pay

## 2017-08-07 ENCOUNTER — Inpatient Hospital Stay (HOSPITAL_BASED_OUTPATIENT_CLINIC_OR_DEPARTMENT_OTHER)
Admission: EM | Admit: 2017-08-07 | Discharge: 2017-08-09 | DRG: 605 | Disposition: A | Discharge status: Home / Self Care | Payer: Self-pay | Attending: Family Medicine | Admitting: Family Medicine

## 2017-08-07 DIAGNOSIS — Z880 Allergy status to penicillin: Secondary | ICD-10-CM

## 2017-08-07 DIAGNOSIS — X58XXXA Exposure to other specified factors, initial encounter: Secondary | ICD-10-CM | POA: Diagnosis present

## 2017-08-07 DIAGNOSIS — Z6832 Body mass index (BMI) 32.0-32.9, adult: Secondary | ICD-10-CM

## 2017-08-07 DIAGNOSIS — Z8711 Personal history of peptic ulcer disease: Secondary | ICD-10-CM

## 2017-08-07 DIAGNOSIS — I252 Old myocardial infarction: Secondary | ICD-10-CM

## 2017-08-07 DIAGNOSIS — L03119 Cellulitis of unspecified part of limb: Secondary | ICD-10-CM | POA: Diagnosis present

## 2017-08-07 DIAGNOSIS — I4891 Unspecified atrial fibrillation: Secondary | ICD-10-CM | POA: Diagnosis present

## 2017-08-07 DIAGNOSIS — Z794 Long term (current) use of insulin: Secondary | ICD-10-CM

## 2017-08-07 DIAGNOSIS — W19XXXA Unspecified fall, initial encounter: Secondary | ICD-10-CM | POA: Diagnosis present

## 2017-08-07 DIAGNOSIS — I130 Hypertensive heart and chronic kidney disease with heart failure and stage 1 through stage 4 chronic kidney disease, or unspecified chronic kidney disease: Secondary | ICD-10-CM | POA: Diagnosis present

## 2017-08-07 DIAGNOSIS — E669 Obesity, unspecified: Secondary | ICD-10-CM | POA: Diagnosis present

## 2017-08-07 DIAGNOSIS — I5022 Chronic systolic (congestive) heart failure: Secondary | ICD-10-CM | POA: Diagnosis present

## 2017-08-07 DIAGNOSIS — E1122 Type 2 diabetes mellitus with diabetic chronic kidney disease: Secondary | ICD-10-CM | POA: Diagnosis present

## 2017-08-07 DIAGNOSIS — Z79899 Other long term (current) drug therapy: Secondary | ICD-10-CM

## 2017-08-07 DIAGNOSIS — K219 Gastro-esophageal reflux disease without esophagitis: Secondary | ICD-10-CM | POA: Diagnosis present

## 2017-08-07 DIAGNOSIS — Z9114 Patient's other noncompliance with medication regimen: Secondary | ICD-10-CM

## 2017-08-07 DIAGNOSIS — I10 Essential (primary) hypertension: Secondary | ICD-10-CM | POA: Diagnosis present

## 2017-08-07 DIAGNOSIS — I420 Dilated cardiomyopathy: Secondary | ICD-10-CM | POA: Diagnosis present

## 2017-08-07 DIAGNOSIS — Z95810 Presence of automatic (implantable) cardiac defibrillator: Secondary | ICD-10-CM | POA: Diagnosis present

## 2017-08-07 DIAGNOSIS — I2510 Atherosclerotic heart disease of native coronary artery without angina pectoris: Secondary | ICD-10-CM | POA: Diagnosis present

## 2017-08-07 DIAGNOSIS — F419 Anxiety disorder, unspecified: Secondary | ICD-10-CM | POA: Diagnosis present

## 2017-08-07 DIAGNOSIS — Z951 Presence of aortocoronary bypass graft: Secondary | ICD-10-CM

## 2017-08-07 DIAGNOSIS — E1165 Type 2 diabetes mellitus with hyperglycemia: Secondary | ICD-10-CM | POA: Diagnosis present

## 2017-08-07 DIAGNOSIS — G4730 Sleep apnea, unspecified: Secondary | ICD-10-CM | POA: Diagnosis present

## 2017-08-07 DIAGNOSIS — N182 Chronic kidney disease, stage 2 (mild): Secondary | ICD-10-CM | POA: Diagnosis present

## 2017-08-07 DIAGNOSIS — M11261 Other chondrocalcinosis, right knee: Secondary | ICD-10-CM | POA: Diagnosis present

## 2017-08-07 DIAGNOSIS — F319 Bipolar disorder, unspecified: Secondary | ICD-10-CM | POA: Diagnosis present

## 2017-08-07 DIAGNOSIS — T383X6A Underdosing of insulin and oral hypoglycemic [antidiabetic] drugs, initial encounter: Secondary | ICD-10-CM | POA: Diagnosis present

## 2017-08-07 DIAGNOSIS — L03116 Cellulitis of left lower limb: Secondary | ICD-10-CM | POA: Diagnosis present

## 2017-08-07 DIAGNOSIS — M1990 Unspecified osteoarthritis, unspecified site: Secondary | ICD-10-CM | POA: Diagnosis present

## 2017-08-07 DIAGNOSIS — R269 Unspecified abnormalities of gait and mobility: Secondary | ICD-10-CM | POA: Diagnosis present

## 2017-08-07 DIAGNOSIS — K760 Fatty (change of) liver, not elsewhere classified: Secondary | ICD-10-CM | POA: Diagnosis present

## 2017-08-07 DIAGNOSIS — E785 Hyperlipidemia, unspecified: Secondary | ICD-10-CM | POA: Diagnosis present

## 2017-08-07 DIAGNOSIS — S91002A Unspecified open wound, left ankle, initial encounter: Principal | ICD-10-CM | POA: Diagnosis present

## 2017-08-07 DIAGNOSIS — Z7901 Long term (current) use of anticoagulants: Secondary | ICD-10-CM

## 2017-08-07 DIAGNOSIS — R739 Hyperglycemia, unspecified: Secondary | ICD-10-CM

## 2017-08-07 DIAGNOSIS — Z96652 Presence of left artificial knee joint: Secondary | ICD-10-CM | POA: Diagnosis present

## 2017-08-07 DIAGNOSIS — I480 Paroxysmal atrial fibrillation: Secondary | ICD-10-CM | POA: Diagnosis present

## 2017-08-07 DIAGNOSIS — Z91018 Allergy to other foods: Secondary | ICD-10-CM

## 2017-08-07 DIAGNOSIS — L0390 Cellulitis, unspecified: Secondary | ICD-10-CM | POA: Diagnosis present

## 2017-08-07 DIAGNOSIS — L89629 Pressure ulcer of left heel, unspecified stage: Secondary | ICD-10-CM | POA: Diagnosis present

## 2017-08-07 LAB — CBC
HCT: 43.7 % (ref 39.0–52.0)
Hemoglobin: 15.1 g/dL (ref 13.0–17.0)
MCH: 27.6 pg (ref 26.0–34.0)
MCHC: 34.6 g/dL (ref 30.0–36.0)
MCV: 79.7 fL (ref 78.0–100.0)
PLATELETS: 255 10*3/uL (ref 150–400)
RBC: 5.48 MIL/uL (ref 4.22–5.81)
RDW: 13.7 % (ref 11.5–15.5)
WBC: 6.4 10*3/uL (ref 4.0–10.5)

## 2017-08-07 LAB — BASIC METABOLIC PANEL
Anion gap: 9 (ref 5–15)
BUN: 32 mg/dL — ABNORMAL HIGH (ref 6–20)
CO2: 26 mmol/L (ref 22–32)
Calcium: 8.8 mg/dL — ABNORMAL LOW (ref 8.9–10.3)
Chloride: 94 mmol/L — ABNORMAL LOW (ref 98–111)
Creatinine, Ser: 1.76 mg/dL — ABNORMAL HIGH (ref 0.61–1.24)
GFR calc Af Amer: 51 mL/min — ABNORMAL LOW (ref >60)
GFR calc non Af Amer: 44 mL/min — ABNORMAL LOW (ref >60)
GLUCOSE: 621 mg/dL — AB (ref 70–99)
Potassium: 4.7 mmol/L (ref 3.5–5.1)
SODIUM: 129 mmol/L — AB (ref 135–145)

## 2017-08-07 LAB — CBG MONITORING, ED
GLUCOSE-CAPILLARY: 516 mg/dL — AB (ref 70–99)
Glucose-Capillary: 529 mg/dL (ref 70–99)

## 2017-08-07 MED ORDER — SODIUM CHLORIDE 0.9 % IV BOLUS
1000.0000 mL | Freq: Once | INTRAVENOUS | Status: AC
  Administered 2017-08-07: 1000 mL via INTRAVENOUS

## 2017-08-07 MED ORDER — INSULIN REGULAR BOLUS VIA INFUSION
0.0000 [IU] | Freq: Three times a day (TID) | INTRAVENOUS | Status: DC
  Filled 2017-08-07: qty 10

## 2017-08-07 MED ORDER — INSULIN GLARGINE 100 UNIT/ML ~~LOC~~ SOLN
50.0000 [IU] | Freq: Once | SUBCUTANEOUS | Status: AC
Start: 2017-08-07 — End: 2017-08-07
  Administered 2017-08-07: 50 [IU] via SUBCUTANEOUS

## 2017-08-07 MED ORDER — VANCOMYCIN HCL IN DEXTROSE 1-5 GM/200ML-% IV SOLN
1000.0000 mg | Freq: Once | INTRAVENOUS | Status: AC
  Administered 2017-08-08: 1000 mg via INTRAVENOUS
  Filled 2017-08-07: qty 200

## 2017-08-07 MED ORDER — DEXTROSE-NACL 5-0.45 % IV SOLN: INTRAVENOUS | Status: DC

## 2017-08-07 MED ORDER — INSULIN GLARGINE 100 UNIT/ML ~~LOC~~ SOLN
SUBCUTANEOUS | Status: AC
  Filled 2017-08-07: qty 1

## 2017-08-07 MED ORDER — SODIUM CHLORIDE 0.9 % IV SOLN
INTRAVENOUS | Status: DC
  Filled 2017-08-07 (×2): qty 1

## 2017-08-07 MED ORDER — VANCOMYCIN HCL IN DEXTROSE 1-5 GM/200ML-% IV SOLN
1000.0000 mg | Freq: Once | INTRAVENOUS | Status: AC
  Administered 2017-08-07: 1000 mg via INTRAVENOUS
  Filled 2017-08-07: qty 200

## 2017-08-07 MED ORDER — DEXTROSE 50 % IV SOLN: 25.0000 mL | INTRAVENOUS | Status: DC | PRN

## 2017-08-07 MED ORDER — VANCOMYCIN HCL 10 G IV SOLR
1250.0000 mg | INTRAVENOUS | Status: DC
  Filled 2017-08-07: qty 1250

## 2017-08-07 MED ORDER — SODIUM CHLORIDE 0.9 % IV SOLN
INTRAVENOUS | Status: DC
Start: 2017-08-07 — End: 2017-08-08
  Administered 2017-08-08: 01:00:00 via INTRAVENOUS

## 2017-08-07 MED ORDER — INSULIN ASPART 100 UNIT/ML ~~LOC~~ SOLN
10.0000 [IU] | Freq: Once | SUBCUTANEOUS | Status: AC
  Administered 2017-08-07: 10 [IU] via SUBCUTANEOUS
  Filled 2017-08-07: qty 1

## 2017-08-07 MED ORDER — SODIUM CHLORIDE 0.9 % IV SOLN
Freq: Once | INTRAVENOUS | Status: AC
  Administered 2017-08-07: 21:00:00 via INTRAVENOUS

## 2017-08-07 NOTE — ED Notes (Signed)
Pt reports increased thirst. Given water. Pt appears to be in NAD. Resting on stretcher comfortably. Did inform this nurse he did not have his insulin today.

## 2017-08-07 NOTE — ED Triage Notes (Signed)
Pt was wearing an ankle monitor on his L ankle which he states has caused a wound. Pt reports pain to the whole L leg.

## 2017-08-07 NOTE — ED Provider Notes (Signed)
[place] EMERGENCY DEPARTMENT Provider Note   CSN: [id number] Arrival date & time: 08/07/17  1532     History   Chief Complaint Chief Complaint  Patient presents with  . Leg Pain    HPI Corey Palmer is a 47 y.o. male.  HPI  Corey Palmer is a 47yo male with a history of type 2 diabetes, hypertension, hyperlipidemia, CHF, CAD, sleep apnea who presents to the emergency department for evaluation of left ankle wound.  Patient reports that he wears an ankle monitor on his left ankle.  It was malfunctioning last week and was vibrating throughout the day and caused a bleeding wound on his left posterior ankle over the Achilles tendon.  He reports that it has been bleeding daily and he is also noticed some yellow/brown drainage as well.  He had the ankle bracelet moved to his right ankle.  States that he has been dressing the wound with Neosporin and gauze.  Reports that his pain has been gradually worsening.  Reports pain feels sharp and throbbing, is worsened with weightbearing and radiates up the entire left leg.  He denies fevers, chills, numbness, weakness, wounds elsewhere, abdominal pain, vomiting.  He checks his blood sugar at home and is usually around 115.  He takes insulin daily, but forgot to take it today.  Past Medical History:  Diagnosis Date  . Anginal pain ([place])   . Anxiety   . Arthritis   . Automatic implantable cardioverter-defibrillator in situ    [employer]   . Bipolar disorder ([place])   . CAD (coronary artery disease)    Cardiac cath (08/19/2000) - Nonobstructive, 40% stenosis of LAD,.   . Cancer [facility]) 2013   benign stomach cancer  . CHF (congestive heart failure) ([place])   . Chondrocalcinosis of right knee 06/11/2013  . Chronic systolic heart failure ([place])    2D echo (00/8676) - Systolic function severely reduced., LV EF 20-25%. Akinesis of apical and anteroseptal mycoardium.  last 2D echo (11/2008 ), LV EF 25-30%, diffuse hypokinesis, and grade 1 diastolic  dysfunction.  . Depression   . Diabetes uncomplicated adult-type II    on insulin therapy, a1c 12.9 in 01/2011  . Dilated cardiomyopathy ([place])    status post ICD placement in 2008 c/b tearing at the atrial junction resulting in tamponade and urgent thoracotomy  . Dyslipidemia   . Dysrhythmia   . GERD (gastroesophageal reflux disease)   . Gout, unspecified   . Hepatic steatosis 2011   seen on ultrasound.   . History of kidney stones   . Hx of echocardiogram    Echo (04/2013):  Mild LVH, EF 20-25%, mild LAE, mild to mod RVE with mild reduced RVSF.  Marland Kitchen Hypertension   . ICD (implantable cardiac defibrillator) in place 2007  . Myocardial infarction ([place]) 2008  . Obesity (BMI 30.0-34.9)   . Pacemaker   . Paroxysmal atrial fibrillation ([facility])    on chronic coumadin, goal INR 2-3. status post direct current  . Peptic ulcer   . Shortness of breath   . Sleep apnea    does not have CPAP    Patient Active Problem List   Diagnosis Date Noted  . Chondrocalcinosis of right knee 06/11/2013  . Chondromalacia of left knee 06/11/2013  . Encounter for therapeutic drug monitoring 03/29/2013  . Type I (juvenile type) diabetes mellitus with unspecified complication, not stated as uncontrolled 03/24/2013  . Prolonged Q-T interval on ECG 09/05/2011  . Acute on chronic systolic heart failure (  [place]) 07/20/2011  . Acute on chronic combined systolic and diastolic heart failure, NYHA class 3 ([place]) 07/17/2011  . Gastric ulcer, acute 04/05/2011  . Nonspecific abnormal finding in stool contents 04/04/2011  . Gastroenteritis 04/04/2011  . Hematemesis 04/03/2011  . Chest pain 02/18/2011  . Long term current use of anticoagulant 03/19/2010  . Atrial fibrillation ([place]) 12/18/2009  . OBESITY 10/19/2009  . CARDIOMYOPATHY 12/05/2008  . AUTOMATIC IMPLANTABLE CARDIAC DEFIBRILLATOR SITU 11/25/2008  . CAD 11/16/2008  . HYPERTRIGLYCERIDEMIA 11/11/2008  . PERICARDIAL EFFUSION 03/29/2008  . HEMATURIA UNSPECIFIED  03/29/2008  . METABOLIC ACIDOSIS 47/42/5956  . AODM 03/11/2008  . DYSLIPIDEMIA 03/11/2008  . Gout, unspecified 03/11/2008  . Essential hypertension, benign 03/11/2008  . Chronic systolic congestive heart failure, NYHA class 2 ([place]) 03/11/2008  . HYPOVOLEMIC SHOCK 03/11/2008    Past Surgical History:  Procedure Laterality Date  . CARDIAC CATHETERIZATION    . CARDIAC PACEMAKER PLACEMENT    . CHOLECYSTECTOMY    . COLONOSCOPY    . CORONARY ARTERY BYPASS GRAFT    . ESOPHAGOGASTRODUODENOSCOPY  04/05/2011   Procedure: ESOPHAGOGASTRODUODENOSCOPY (EGD);  Surgeon: Irene Shipper, MD;  Location[facility] ENDOSCOPY;  Service: Endoscopy;  Laterality: N/A;  . JOINT REPLACEMENT     left knee replacement  . KNEE ARTHROSCOPY Right 06/11/2013   Procedure: RIGHT KNEE ARTHROSCOPY with chondroplasty;  Surgeon: Johnny Bridge, MD;  Location: [facility];  Service: Orthopedics;  Laterality: Right;  . TOTAL KNEE ARTHROPLASTY Left         Home Medications    Prior to Admission medications   Medication Sig Start Date End Date Taking? Authorizing Provider  amLODipine (NORVASC) 5 MG tablet Take 1 tablet (5 mg total) by mouth daily. 05/05/13 01/11/15  Richardson Dopp T, PA-C  apixaban (ELIQUIS) 5 MG TABS tablet Take 1 tablet (5 mg total) by mouth 2 (two) times daily. 05/11/13   Larey Dresser, MD  atorvastatin (LIPITOR) 20 MG tablet Take 1 tablet (20 mg total) by mouth daily at 6 PM. 05/05/13 01/11/15  Richardson Dopp T, PA-C  colchicine 0.6 MG tablet Take 0.6 mg by mouth daily as needed (for gout).     [provider]  digoxin (LANOXIN) 0.125 MG tablet Take 1 tablet (0.125 mg total) by mouth daily. 05/05/13 12/27/14  Richardson Dopp T, PA-C  divalproex (DEPAKOTE) 500 MG DR tablet Take 500 mg by mouth 2 (two) times daily.    [provider]  FLUoxetine (PROZAC) 20 MG capsule Take 20 mg by mouth 2 (two) times daily.    [provider]  furosemide (LASIX) 40 MG tablet Take 1.5 tablets (60 mg total) by mouth  2 (two) times daily. 05/05/13 10/01/14  Richardson Dopp T, PA-C  hydrOXYzine (ATARAX/VISTARIL) 50 MG tablet Take 50 mg by mouth 3 (three) times daily.    [provider]  insulin aspart (NOVOLOG) 100 UNIT/ML injection Inject 4-15 Units into the skin 3 (three) times daily as needed for high blood sugar (per sliding scale).    [provider]  Insulin Glargine (LANTUS) 100 UNIT/ML Solostar Pen Inject 130 Units into the skin every morning. 05/26/13   Renato Shin, MD  lisinopril (PRINIVIL,ZESTRIL) 40 MG tablet Take 1 tablet (40 mg total) by mouth daily. 05/05/13   Richardson Dopp T, PA-C  methocarbamol (ROBAXIN) 500 MG tablet Take 1 tablet (500 mg total) by mouth 4 (four) times daily. 06/11/13   Marchia Bond, MD  metoprolol (TOPROL XL) 200 MG 24 hr tablet Take 1 tablet (200 mg  total) by mouth daily. 05/06/13 11/29/14  Evans Lance, MD  Multiple Vitamin CHEW Chew 1 tablet by mouth daily.     [provider]  nitroGLYCERIN (NITROSTAT) 0.4 MG SL tablet Place 0.4 mg under the tongue every 5 (five) minutes as needed for chest pain.    [provider]  omega-3 acid ethyl esters (LOVAZA) 1 G capsule Take 2 capsules (2 g total) by mouth 2 (two) times daily. 05/05/13   Richardson Dopp T, PA-C  omeprazole (PRILOSEC) 40 MG capsule Take 1 capsule (40 mg total) by mouth daily. 08/18/13   Larey Dresser, MD  pantoprazole (PROTONIX) 40 MG tablet Take 1 tablet (40 mg total) by mouth 2 (two) times daily. 08/18/13   Larey Dresser, MD  potassium chloride SA (K-DUR,KLOR-CON) 20 MEQ tablet Take 2 tablets (40 mEq total) by mouth daily. 05/05/13 05/05/14  Richardson Dopp T, PA-C  promethazine (PHENERGAN) 25 MG tablet Take 1 tablet (25 mg total) by mouth every 6 (six) hours as needed for nausea or vomiting. 06/11/13   Marchia Bond, MD  spironolactone (ALDACTONE) 25 MG tablet Take 1 tablet (25 mg total) by mouth daily. 05/05/13 05/05/14  Richardson Dopp T, PA-C  traMADol (ULTRAM) 50 MG tablet Take 50 mg by mouth  every 6 (six) hours as needed for moderate pain.    [provider]    Family History Family History  Problem Relation Age of Onset  . Diabetes Mother   . Coronary artery disease Mother 109  . Gout Mother   . Heart disease Mother   . Heart attack Mother 18  . Diabetes Father   . Coronary artery disease Father 31       Died in a house fire   . Heart disease Father   . Heart disease Brother     Social History Social History   Tobacco Use  . Smoking status: Never Smoker  . Smokeless tobacco: Never Used  Substance Use Topics  . Alcohol use: No  . Drug use: No     Allergies   Orange fruit and Penicillins   Review of Systems Review of Systems  Constitutional: Negative for chills and fever.  Respiratory: Negative for shortness of breath.   Cardiovascular: Negative for chest pain.  Gastrointestinal: Negative for abdominal pain and vomiting.  Genitourinary: Negative for difficulty urinating.  Musculoskeletal: Positive for arthralgias (left ankle) and gait problem (pain). Negative for joint swelling.  Skin: Positive for wound (left ankle).  Neurological: Negative for weakness, light-headedness and numbness.  Psychiatric/Behavioral: Negative for agitation.     Physical Exam Updated Vital Signs BP 114/81 (BP Location: Left Arm)   Pulse 95   Temp 98.2 F (36.8 C) (Oral)   Resp 18   Ht 6\' 3"  (1.905 m)   Wt 117 kg (258 lb)   SpO2 97%   BMI 32.25 kg/m   Physical Exam  Constitutional: He is oriented to person, place, and time. He appears well-developed and well-nourished. No distress.  Nontoxic-appearing.  HENT:  Head: Normocephalic and atraumatic.  Mouth/Throat: Oropharynx is clear and moist. No oropharyngeal exudate.  Eyes: Pupils are equal, round, and reactive to light. Conjunctivae are normal. Right eye exhibits no discharge. Left eye exhibits no discharge.  Neck: Normal range of motion. Neck supple.  Cardiovascular: Normal rate, regular rhythm and  intact distal pulses.  No murmur heard. Pulmonary/Chest: Effort normal and breath sounds normal. No stridor. No respiratory distress. He has no wheezes. He has no rales.  Abdominal:  Soft. There is no tenderness.  Musculoskeletal:  2 cm x 4 cm open wound over the left lower leg with surrounding erythema, induration and pain.  Granulation tissue present.  See picture below.  Full active range of motion of the left ankle.  Achilles intact.  DP pulses 2+ and symmetric bilaterally.  Distal sensation to light touch intact beyond the wound.  No knee tenderness.  Neurological: He is alert and oriented to person, place, and time. Coordination normal.  Skin: Skin is warm and dry. He is not diaphoretic.  Psychiatric: He has a normal mood and affect. His behavior is normal.  Nursing note and vitals reviewed.      ED Treatments / Results  Labs (all labs ordered are listed, but only abnormal results are displayed) Labs Reviewed  BASIC METABOLIC PANEL - Abnormal; Notable for the following components:      Result Value   Sodium 129 (*)    Chloride 94 (*)    Glucose, Bld 621 (*)    BUN 32 (*)    Creatinine, Ser 1.76 (*)    Calcium 8.8 (*)    GFR calc non Af Amer 44 (*)    GFR calc Af Amer 51 (*)    All other components within normal limits  CBG MONITORING, ED - Abnormal; Notable for the following components:   Glucose-Capillary 529 (*)    All other components within normal limits  CBG MONITORING, ED - Abnormal; Notable for the following components:   Glucose-Capillary 516 (*)    All other components within normal limits  CBC    EKG None  Radiology Dg Tibia/fibula Left  Result Date: 08/07/2017 CLINICAL DATA:  Ankle wound from monitor. EXAM: LEFT TIBIA AND FIBULA - 2 VIEW COMPARISON:  None. FINDINGS: No fracture. No subluxation. No worrisome lytic or sclerotic osseous abnormality. No radiopaque soft tissue foreign body in the distal leg. No evidence for soft tissue gas in the distal leg.  IMPRESSION: Negative. Electronically Signed   By: Misty Stanley M.D.   On: 08/07/2017 19:15    Procedures Procedures (including critical care time)  Medications Ordered in ED Medications  dextrose 50 % solution 25 mL (has no administration in time range)  0.9 %  sodium chloride infusion (has no administration in time range)  vancomycin (VANCOCIN) IVPB 1000 mg/200 mL premix (1,000 mg Intravenous New Bag/Given 08/07/17 2331)    Followed by  vancomycin (VANCOCIN) IVPB 1000 mg/200 mL premix (has no administration in time range)  vancomycin (VANCOCIN) 1,250 mg in sodium chloride 0.9 % 250 mL IVPB (has no administration in time range)  sodium chloride 0.9 % bolus 1,000 mL (0 mLs Intravenous Stopped 08/07/17 2025)  sodium chloride 0.9 % bolus 1,000 mL (0 mLs Intravenous Stopped 08/07/17 2118)  insulin aspart (novoLOG) injection 10 Units (10 Units Subcutaneous Given 08/07/17 2125)  0.9 %  sodium chloride infusion ( Intravenous New Bag/Given 08/07/17 2126)  insulin glargine (LANTUS) injection 50 Units (50 Units Subcutaneous Given 08/07/17 2348)     Initial Impression / Assessment and Plan / ED Course  I have reviewed the triage vital signs and the nursing notes.  Pertinent labs & imaging results that were available during my care of the patient were reviewed by me and considered in my medical decision making (see chart for details).     Patient presents to the ED with left posterior ankle wound over the past week.  On exam there is surrounding erythema, warmth and tenderness.  Otherwise, foot is  neurovascularly intact.  Appears to be superimposed infection.  No fever and vital signs stable.  CBC without leukocytosis.  No sepsis.  X-ray left tibia/fibula without acute abnormality, no osteomyelitis.  His labs show that he is hyperglycemic with glucose 621.  No anion gap or concern for DKA.  His creatinine is also elevated at 1.76, baseline 1.3.  Patient given 2 L fluid bolus as well as 10 units  subcutaneous NovoLog.  Repeat CBG 516.  Discussed this patient with Dr. Bobby Rumpf who suggests admission given hyperglycemia and foot wound.  Patient started on IV vancomycin.  Discussed this patient with Dr. Hal Hope who will admit the patient at [facility].  Patient given his home dose of 50 units Lantus.  Informed patient of the plan and he agrees.  Final Clinical Impressions(s) / ED Diagnoses   Final diagnoses:  None    ED Discharge Orders    None       Bernarda Caffey 08/08/17 0029    Fredia Sorrow, MD 08/10/17 [phone]

## 2017-08-07 NOTE — Progress Notes (Signed)
Pharmacy Antibiotic Note  Corey Palmer is a 47 y.o. male admitted on 08/07/2017 with LLE wound. Pharmacy has been consulted for Vancomycin dosing.  SCr 1.76, nCrCl~50 ml/min   Plan: - Vancomycin 2g IV x 1 followed by 1250 mg IV every 24 hours - Will continue to follow renal function, culture results, LOT, and antibiotic de-escalation plans   Height: 6\' 3"  (190.5 cm) Weight: 258 lb (117 kg) IBW/kg (Calculated) : 84.5  Temp (24hrs), Avg:98.2 F (36.8 C), Min:98.2 F (36.8 C), Max:98.2 F (36.8 C)  Recent Labs  Lab 08/07/17 1825  WBC 6.4  CREATININE 1.76*    Estimated Creatinine Clearance: 70.8 mL/min (A) (by C-G formula based on SCr of 1.76 mg/dL (H)).    Allergies  Allergen Reactions  . Orange Fruit Anaphylaxis  . Penicillins Anaphylaxis    Thank you for allowing pharmacy to be a part of this patient's care.  Lawson Radar 08/07/2017 11:06 PM

## 2017-08-08 ENCOUNTER — Inpatient Hospital Stay (HOSPITAL_COMMUNITY): Payer: Self-pay

## 2017-08-08 ENCOUNTER — Encounter (HOSPITAL_COMMUNITY): Payer: Self-pay | Admitting: Internal Medicine

## 2017-08-08 DIAGNOSIS — L03119 Cellulitis of unspecified part of limb: Secondary | ICD-10-CM

## 2017-08-08 DIAGNOSIS — L0390 Cellulitis, unspecified: Secondary | ICD-10-CM

## 2017-08-08 DIAGNOSIS — E1165 Type 2 diabetes mellitus with hyperglycemia: Secondary | ICD-10-CM | POA: Diagnosis present

## 2017-08-08 DIAGNOSIS — L03116 Cellulitis of left lower limb: Secondary | ICD-10-CM

## 2017-08-08 LAB — CBC WITH DIFFERENTIAL/PLATELET
BASOS ABS: 0 10*3/uL (ref 0.0–0.1)
BASOS PCT: 0 %
EOS ABS: 0.2 10*3/uL (ref 0.0–0.7)
Eosinophils Relative: 4 %
HCT: 37.2 % — ABNORMAL LOW (ref 39.0–52.0)
HEMOGLOBIN: 12.5 g/dL — AB (ref 13.0–17.0)
Lymphocytes Relative: 27 %
Lymphs Abs: 1.3 10*3/uL (ref 0.7–4.0)
MCH: 27.1 pg (ref 26.0–34.0)
MCHC: 33.6 g/dL (ref 30.0–36.0)
MCV: 80.7 fL (ref 78.0–100.0)
Monocytes Absolute: 0.4 10*3/uL (ref 0.1–1.0)
Monocytes Relative: 8 %
NEUTROS ABS: 2.9 10*3/uL (ref 1.7–7.7)
NEUTROS PCT: 61 %
Platelets: 214 10*3/uL (ref 150–400)
RBC: 4.61 MIL/uL (ref 4.22–5.81)
RDW: 13.5 % (ref 11.5–15.5)
WBC: 4.7 10*3/uL (ref 4.0–10.5)

## 2017-08-08 LAB — BASIC METABOLIC PANEL
ANION GAP: 9 (ref 5–15)
BUN: 32 mg/dL — ABNORMAL HIGH (ref 6–20)
CALCIUM: 8.2 mg/dL — AB (ref 8.9–10.3)
CO2: 25 mmol/L (ref 22–32)
CREATININE: 1.46 mg/dL — AB (ref 0.61–1.24)
Chloride: 100 mmol/L (ref 98–111)
GFR, EST NON AFRICAN AMERICAN: 55 mL/min — AB (ref >60)
Glucose, Bld: 482 mg/dL — ABNORMAL HIGH (ref 70–99)
Potassium: 3.7 mmol/L (ref 3.5–5.1)
SODIUM: 134 mmol/L — AB (ref 135–145)

## 2017-08-08 LAB — GLUCOSE, CAPILLARY
GLUCOSE-CAPILLARY: 420 mg/dL — AB (ref 70–99)
GLUCOSE-CAPILLARY: 395 mg/dL — AB (ref 70–99)
GLUCOSE-CAPILLARY: 283 mg/dL — AB (ref 70–99)
Glucose-Capillary: 197 mg/dL — ABNORMAL HIGH (ref 70–99)
Glucose-Capillary: 212 mg/dL — ABNORMAL HIGH (ref 70–99)

## 2017-08-08 LAB — CK: CK TOTAL: 45 U/L — AB (ref 49–397)

## 2017-08-08 LAB — DIGOXIN LEVEL: DIGOXIN LVL: 0.3 ng/mL — AB (ref 0.8–2.0)

## 2017-08-08 LAB — HEMOGLOBIN A1C
Hgb A1c MFr Bld: 12.9 % — ABNORMAL HIGH (ref 4.8–5.6)
MEAN PLASMA GLUCOSE: 323.53 mg/dL

## 2017-08-08 LAB — CBG MONITORING, ED: Glucose-Capillary: 481 mg/dL — ABNORMAL HIGH (ref 70–99)

## 2017-08-08 LAB — C-REACTIVE PROTEIN: CRP: 1.7 mg/dL — ABNORMAL HIGH (ref <1.0)

## 2017-08-08 LAB — HIV ANTIBODY (ROUTINE TESTING W REFLEX): HIV SCREEN 4TH GENERATION: NONREACTIVE

## 2017-08-08 LAB — PREALBUMIN: Prealbumin: 21.6 mg/dL (ref 18–38)

## 2017-08-08 LAB — SEDIMENTATION RATE: Sed Rate: 27 mm/hr — ABNORMAL HIGH (ref 0–16)

## 2017-08-08 MED ORDER — LISINOPRIL 20 MG PO TABS
20.0000 mg | ORAL_TABLET | Freq: Every day | ORAL | Status: DC
  Administered 2017-08-08 – 2017-08-09 (×2): 20 mg via ORAL
  Filled 2017-08-08 (×2): qty 1

## 2017-08-08 MED ORDER — BUSPIRONE HCL 10 MG PO TABS
10.0000 mg | ORAL_TABLET | Freq: Three times a day (TID) | ORAL | Status: DC
  Administered 2017-08-08 – 2017-08-09 (×4): 10 mg via ORAL
  Filled 2017-08-08 (×4): qty 1

## 2017-08-08 MED ORDER — COLLAGENASE 250 UNIT/GM EX OINT
TOPICAL_OINTMENT | Freq: Every day | CUTANEOUS | Status: DC
  Administered 2017-08-08 – 2017-08-09 (×2): via TOPICAL
  Filled 2017-08-08: qty 90

## 2017-08-08 MED ORDER — METHOCARBAMOL 500 MG PO TABS
500.0000 mg | ORAL_TABLET | Freq: Three times a day (TID) | ORAL | Status: DC | PRN
  Administered 2017-08-08 – 2017-08-09 (×2): 500 mg via ORAL
  Filled 2017-08-08 (×2): qty 1

## 2017-08-08 MED ORDER — INSULIN ASPART 100 UNIT/ML ~~LOC~~ SOLN
15.0000 [IU] | Freq: Once | SUBCUTANEOUS | Status: AC
  Administered 2017-08-08: 15 [IU] via SUBCUTANEOUS

## 2017-08-08 MED ORDER — ACETAMINOPHEN 650 MG RE SUPP
650.0000 mg | Freq: Four times a day (QID) | RECTAL | Status: DC | PRN
Start: 2017-08-08 — End: 2017-08-09

## 2017-08-08 MED ORDER — APIXABAN 5 MG PO TABS
5.0000 mg | ORAL_TABLET | Freq: Two times a day (BID) | ORAL | Status: DC
Start: 2017-08-08 — End: 2017-08-09
  Administered 2017-08-08 – 2017-08-09 (×3): 5 mg via ORAL
  Filled 2017-08-08 (×4): qty 1

## 2017-08-08 MED ORDER — CIPROFLOXACIN IN D5W 400 MG/200ML IV SOLN
400.0000 mg | Freq: Two times a day (BID) | INTRAVENOUS | Status: DC
  Administered 2017-08-08: 400 mg via INTRAVENOUS
  Filled 2017-08-08: qty 200

## 2017-08-08 MED ORDER — VANCOMYCIN HCL IN DEXTROSE 1-5 GM/200ML-% IV SOLN
1000.0000 mg | Freq: Two times a day (BID) | INTRAVENOUS | Status: DC
  Administered 2017-08-08: 1000 mg via INTRAVENOUS
  Filled 2017-08-08: qty 200

## 2017-08-08 MED ORDER — ACETAMINOPHEN 325 MG PO TABS
650.0000 mg | ORAL_TABLET | Freq: Four times a day (QID) | ORAL | Status: DC | PRN
Start: 2017-08-08 — End: 2017-08-09

## 2017-08-08 MED ORDER — ONDANSETRON HCL 4 MG PO TABS: 4.0000 mg | ORAL_TABLET | Freq: Four times a day (QID) | ORAL | Status: DC | PRN

## 2017-08-08 MED ORDER — INSULIN ASPART 100 UNIT/ML ~~LOC~~ SOLN
40.0000 [IU] | Freq: Two times a day (BID) | SUBCUTANEOUS | Status: DC
  Administered 2017-08-08 – 2017-08-09 (×3): 40 [IU] via SUBCUTANEOUS

## 2017-08-08 MED ORDER — PANTOPRAZOLE SODIUM 40 MG PO TBEC
40.0000 mg | DELAYED_RELEASE_TABLET | Freq: Every day | ORAL | Status: DC
  Administered 2017-08-08 – 2017-08-09 (×2): 40 mg via ORAL
  Filled 2017-08-08 (×2): qty 1

## 2017-08-08 MED ORDER — FENOFIBRATE 160 MG PO TABS
160.0000 mg | ORAL_TABLET | Freq: Every day | ORAL | Status: DC
  Administered 2017-08-08 – 2017-08-09 (×2): 160 mg via ORAL
  Filled 2017-08-08 (×2): qty 1

## 2017-08-08 MED ORDER — CLINDAMYCIN HCL 300 MG PO CAPS
300.0000 mg | ORAL_CAPSULE | Freq: Four times a day (QID) | ORAL | Status: DC
  Administered 2017-08-08 – 2017-08-09 (×6): 300 mg via ORAL
  Filled 2017-08-08 (×6): qty 1

## 2017-08-08 MED ORDER — ONDANSETRON HCL 4 MG/2ML IJ SOLN: 4.0000 mg | Freq: Four times a day (QID) | INTRAMUSCULAR | Status: DC | PRN

## 2017-08-08 MED ORDER — INSULIN ASPART 100 UNIT/ML ~~LOC~~ SOLN
0.0000 [IU] | Freq: Three times a day (TID) | SUBCUTANEOUS | Status: DC
  Administered 2017-08-08: 2 [IU] via SUBCUTANEOUS
  Administered 2017-08-08: 5 [IU] via SUBCUTANEOUS
  Administered 2017-08-08 – 2017-08-09 (×2): 7 [IU] via SUBCUTANEOUS
  Administered 2017-08-09: 3 [IU] via SUBCUTANEOUS

## 2017-08-08 MED ORDER — CARVEDILOL 25 MG PO TABS
25.0000 mg | ORAL_TABLET | Freq: Two times a day (BID) | ORAL | Status: DC
  Administered 2017-08-08 – 2017-08-09 (×3): 25 mg via ORAL
  Filled 2017-08-08 (×3): qty 1

## 2017-08-08 MED ORDER — PREMIER PROTEIN SHAKE
11.0000 [oz_av] | Freq: Two times a day (BID) | ORAL | Status: DC
  Administered 2017-08-08 – 2017-08-09 (×3): 11 [oz_av] via ORAL
  Filled 2017-08-08 (×3): qty 325.31

## 2017-08-08 MED ORDER — POTASSIUM CHLORIDE CRYS ER 20 MEQ PO TBCR: 20.0000 meq | EXTENDED_RELEASE_TABLET | Freq: Every day | ORAL | Status: DC

## 2017-08-08 MED ORDER — NITROGLYCERIN 0.4 MG SL SUBL: 0.4000 mg | SUBLINGUAL_TABLET | SUBLINGUAL | Status: DC | PRN

## 2017-08-08 MED ORDER — DIGOXIN 125 MCG PO TABS
0.1250 mg | ORAL_TABLET | Freq: Every day | ORAL | Status: DC
  Administered 2017-08-08 – 2017-08-09 (×2): 0.125 mg via ORAL
  Filled 2017-08-08 (×2): qty 1

## 2017-08-08 MED ORDER — FLUOXETINE HCL 20 MG PO CAPS
80.0000 mg | ORAL_CAPSULE | Freq: Every day | ORAL | Status: DC
  Administered 2017-08-08 – 2017-08-09 (×2): 80 mg via ORAL
  Filled 2017-08-08 (×2): qty 4

## 2017-08-08 MED ORDER — TRAMADOL HCL 50 MG PO TABS
50.0000 mg | ORAL_TABLET | Freq: Four times a day (QID) | ORAL | Status: DC | PRN
  Administered 2017-08-08 – 2017-08-09 (×4): 50 mg via ORAL
  Filled 2017-08-08 (×4): qty 1

## 2017-08-08 MED ORDER — FUROSEMIDE 40 MG PO TABS: 60.0000 mg | ORAL_TABLET | Freq: Every day | ORAL | Status: DC

## 2017-08-08 MED ORDER — INSULIN GLARGINE 100 UNIT/ML ~~LOC~~ SOLN
50.0000 [IU] | Freq: Two times a day (BID) | SUBCUTANEOUS | Status: DC
  Administered 2017-08-08 – 2017-08-09 (×3): 50 [IU] via SUBCUTANEOUS
  Filled 2017-08-08 (×4): qty 0.5

## 2017-08-08 MED ORDER — ACETAMINOPHEN 325 MG PO TABS
650.0000 mg | ORAL_TABLET | Freq: Once | ORAL | Status: AC
  Administered 2017-08-08: 650 mg via ORAL
  Filled 2017-08-08: qty 2

## 2017-08-08 MED ORDER — ATORVASTATIN CALCIUM 40 MG PO TABS
80.0000 mg | ORAL_TABLET | Freq: Every day | ORAL | Status: DC
  Administered 2017-08-08: 80 mg via ORAL
  Filled 2017-08-08: qty 2

## 2017-08-08 MED ORDER — DOXYCYCLINE HYCLATE 100 MG PO TABS
100.0000 mg | ORAL_TABLET | Freq: Two times a day (BID) | ORAL | Status: DC
  Administered 2017-08-08 – 2017-08-09 (×3): 100 mg via ORAL
  Filled 2017-08-08 (×3): qty 1

## 2017-08-08 NOTE — ED Notes (Signed)
[facility] arrived to transport pt to [facility].  

## 2017-08-08 NOTE — Progress Notes (Signed)
Initial Nutrition Assessment  DOCUMENTATION CODES:   Obesity unspecified  INTERVENTION:   - Premier Protein BID, each provides 160 kcal and 30 grams protein   NUTRITION DIAGNOSIS:   Increased nutrient needs related to wound healing, chronic illness(CHF) as evidenced by estimated needs.  GOAL:   Patient will meet greater than or equal to 90% of their needs  MONITOR:   PO intake, Supplement acceptance, I & O's, Weight trends, Skin, Labs  REASON FOR ASSESSMENT:   Consult Wound healing  ASSESSMENT:   47 year old male who presented to the ED with leg pain and an ankle wound pt states was caused by an ankle monitor. PMH significant for type 2 diabetes mellitus, hypertension, hyperlipidemia, CHF, CAD, and sleep apnea.  Spoke with pt at bedside who reports having a good appetite and typically eating 3 meals daily. Pt with no nutrition-related concerns at time of visit.  Pt states that immediately PTA he had a 4 piece [employer] meal with a biscuit. Pt states that he normally "eats well" and keeps his blood glucose between 110-120.  Pt denies any recent weight loss.  Pt states that his relative did have him drink Ensure Max Protein when his wound began. Pt agreeable to receiving Premier Protein during admission. Discussed importance of protein and adequate PO intake in wound healing.  Medications reviewed and include: sliding scale Novolog TID with meals, 40 units Novolog BID, 50 units Lantus BID, 40 mg Protonix daily  Labs reviewed: sodium 134 (L), BUN 32 (H), creatinine 1.46 (H), hemoglobin 12.5 (L), HCT 37.2 (L) CBG's: 197, 395, 420, 481, 516, 529 since admission  NUTRITION - FOCUSED PHYSICAL EXAM:  Not completed at this time.  Diet Order:   Diet Order           Diet heart healthy/carb modified Room service appropriate? Yes; Fluid consistency: Thin; Fluid restriction: 1200 mL Fluid  Diet effective now          EDUCATION NEEDS:   Education needs have been  addressed  Skin:  Skin Assessment: Skin Integrity Issues: Skin Integrity Issues:: Other (Comment) Other: open wound to ankle  Last BM:  08/08/17  Height:   Ht Readings from Last 1 Encounters:  08/08/17 6\' 3"  (1.905 m)    Weight:   Wt Readings from Last 1 Encounters:  08/08/17 255 lb 11.7 oz (116 kg)    Ideal Body Weight:  89.09 kg  BMI:  Body mass index is 31.96 kg/m.  Estimated Nutritional Needs:   Kcal:  2400-2600 kcal/day  Protein:  120-135 grams/day  Fluid:  >/= 2.0 L/day    Gaynell Face, MS, RD, LDN Pager: [phone] Weekend/After Hours: [phone]

## 2017-08-08 NOTE — Progress Notes (Signed)
VASCULAR LAB PRELIMINARY  ARTERIAL  ABI completed:    RIGHT    LEFT    PRESSURE WAVEFORM  PRESSURE WAVEFORM  BRACHIAL 144 Triphasic BRACHIAL 138 Triphasic  DP 157 Triphasic DP 158 Triphasic  AT   AT    PT 224 Triphasic PT 189 Triphasic  PER   PER    GREAT TOE 94  GREAT TOE 99     RIGHT LEFT  ABI 1.56 1.31     HONG[place]  Kallee Nam, RVT 08/08/2017, 3:18 PM

## 2017-08-08 NOTE — Consult Note (Addendum)
[facility] Nurse wound consult note Reason for Consult: Consult requested for right ankle wound which developed related to a hard plastic device strapped to the patient's leg.  He states it started as a blister which declined. Wound type: Full thickness wound to left posterior ankle area Measurement: 1.5X3X.1cm Wound bed: 10% red, 90% soft black eschar Drainage (amount, consistency, odor) small amt tan drainage, no odor or fluctuance Periwound: erythremia surrounding Dressing procedure/placement/frequency: Santyl ointment to provide enzymatic debridement of nonviable tissue.  Discussed plan of care with patient for continued use after discharge and he verbalized understanding. Please re-consult if further assistance is needed.  Thank-you,  Julien Girt MSN, [place], [place], [place], [place]

## 2017-08-08 NOTE — Progress Notes (Addendum)
Pharmacy Antibiotic Note  Corey Palmer is a 47 y.o. male admitted on 08/07/2017 with LLE wound.  Pharmacy has been consulted for vancomycin and cipro dosing.  Plan: Vancomycin 2 gm ( 1g + 1g) given at [facility] ED followed by vancomycin 1 gm IV q12 for AUC 482 (Scr 1.46, IBW/ABW) Cipro 400 IV q12 - change to PO when appropriate F/u renal fxn, WBC, temp, culture data Vancomycin levels as needed  Height: 6\' 3"  (190.5 cm) Weight: 255 lb 11.7 oz (116 kg) IBW/kg (Calculated) : 84.5  Temp (24hrs), Avg:97.9 F (36.6 C), Min:97.5 F (36.4 C), Max:98.2 F (36.8 C)  Recent Labs  Lab 08/07/17 1825 08/08/17 0501  WBC 6.4 4.7  CREATININE 1.76* 1.46*    Estimated Creatinine Clearance: 85 mL/min (A) (by C-G formula based on SCr of 1.46 mg/dL (H)).    Allergies  Allergen Reactions  . Orange Fruit Anaphylaxis  . Penicillins Anaphylaxis   Antimicrobials this admission:  4/12 vanc>> 4/12 cipro>> Dose adjustments this admission:  7/12 change vanc 1250 q24 to 1 gm q12 for AUC 482, SCr 1.46 Microbiology results:   Thank you for allowing pharmacy to be a part of this patient's care. Eudelia Bunch, Pharm.D. [phone] 08/08/2017 8:07 AM

## 2017-08-08 NOTE — Progress Notes (Signed)
Patient seen and examined at bedside, patient admitted after midnight, please see earlier detailed admission note by Rise Patience, MD. Briefly, patient presented with a left infected ulcer. Switch antibiotics to Clindamycin and Doxycycline. Obtain blood cultures. Watch blood sugars today. WOC recommendations. Anticipate discharge tomorrow.   Cordelia Poche, MD [facility] 08/08/2017, 2:34 PM

## 2017-08-08 NOTE — Progress Notes (Signed)
Pt in from [facility]. Requesting something for pain. Dr. Hal Hope made aware. New order given for tylenol. Hale Bogus.

## 2017-08-08 NOTE — Progress Notes (Signed)
Patient refuses CPAP. Says he does not wear it all the time at home, it does not bother him when he does not wear it. Reassured him that if he changed his mind we could bring up one of our hospital units. RT will continue to monitor.

## 2017-08-08 NOTE — H&P (Addendum)
History and Physical    Corey Palmer VQQ:[id number] DOB: 04/20/1970 DOA: 08/07/2017  PCP: Patient, No Pcp Per  Patient coming from: Home.  Chief Complaint: Left lower extremity pain and ulceration.  HPI: Corey Palmer is a 47 y.o. male with history of cardiomyopathy status post AICD placement, diabetes mellitus type 2, paroxysmal atrial fibrillation, chronic kidney disease stage II presents to the ER at [facility] with worsening wound in the left ankle area over the last 1 week.  Patient was wearing a device which was vibrating whole day last week and started developing a wound over there.  Since then it started forming a blister and ulcerated.  Denying some purulent discharge.  Has had some subjective feeling of fever chills.  Yesterday patient went was trying to walk after he woke up from the bed his left leg gave away and had a fall but did not lose consciousness or hit his head.  Patient presented to the ER at [facility].  Has not taken his Lantus dose yesterday.  ED Course: In the ER his blood sugar was elevated at 600 which improved to 400 after giving his Lantus dose.  X-ray of the left tibia does not show any bony involvement.  On exam patient has an ulceration involving the left distal aspect with active discharge.  Patient was placed on vancomycin and admitted for further management of cellulitis.  Review of Systems: As per HPI, rest all negative.   Past Medical History:  Diagnosis Date  . Anginal pain ([place])   . Anxiety   . Arthritis   . Automatic implantable cardioverter-defibrillator in situ    [employer]   . Bipolar disorder ([place])   . CAD (coronary artery disease)    Cardiac cath (08/19/2000) - Nonobstructive, 40% stenosis of LAD,.   . Cancer [facility] 2013   benign stomach cancer  . CHF (congestive heart failure) ([place])   . Chondrocalcinosis of right knee 06/11/2013  . Chronic systolic heart failure ([place])    2D echo (43/3295) - Systolic function severely  reduced., LV EF 20-25%. Akinesis of apical and anteroseptal mycoardium.  last 2D echo (11/2008 ), LV EF 25-30%, diffuse hypokinesis, and grade 1 diastolic dysfunction.  . Depression   . Diabetes uncomplicated adult-type II    on insulin therapy, a1c 12.9 in 01/2011  . Dilated cardiomyopathy ([place])    status post ICD placement in 2008 c/b tearing at the atrial junction resulting in tamponade and urgent thoracotomy  . Dyslipidemia   . Dysrhythmia   . GERD (gastroesophageal reflux disease)   . Gout, unspecified   . Hepatic steatosis 2011   seen on ultrasound.   . History of kidney stones   . Hx of echocardiogram    Echo (04/2013):  Mild LVH, EF 20-25%, mild LAE, mild to mod RVE with mild reduced RVSF.  Marland Kitchen Hypertension   . ICD (implantable cardiac defibrillator) in place 2007  . Myocardial infarction ([place]) 2008  . Obesity (BMI 30.0-34.9)   . Pacemaker   . Paroxysmal atrial fibrillation (HCC)    on chronic coumadin, goal INR 2-3. status post direct current  . Peptic ulcer   . Shortness of breath   . Sleep apnea    does not have CPAP    Past Surgical History:  Procedure Laterality Date  . CARDIAC CATHETERIZATION    . CARDIAC PACEMAKER PLACEMENT    . CHOLECYSTECTOMY    . COLONOSCOPY    . CORONARY ARTERY BYPASS  GRAFT    . ESOPHAGOGASTRODUODENOSCOPY  04/05/2011   Procedure: ESOPHAGOGASTRODUODENOSCOPY (EGD);  Surgeon: Irene Shipper, MD;  Location[facility] ENDOSCOPY;  Service: Endoscopy;  Laterality: N/A;  . JOINT REPLACEMENT     left knee replacement  . KNEE ARTHROSCOPY Right 06/11/2013   Procedure: RIGHT KNEE ARTHROSCOPY with chondroplasty;  Surgeon: Johnny Bridge, MD;  Location: [facility];  Service: Orthopedics;  Laterality: Right;  . TOTAL KNEE ARTHROPLASTY Left      reports that he has never smoked. He has never used smokeless tobacco. He reports that he does not drink alcohol or use drugs.  Allergies  Allergen Reactions  . Orange Fruit Anaphylaxis  . Penicillins Anaphylaxis    Family  History  Problem Relation Age of Onset  . Diabetes Mother   . Coronary artery disease Mother 40  . Gout Mother   . Heart disease Mother   . Heart attack Mother 67  . Diabetes Father   . Coronary artery disease Father 32       Died in a house fire   . Heart disease Father   . Heart disease Brother     Prior to Admission medications   Medication Sig Start Date End Date Taking? Authorizing Provider  atorvastatin (LIPITOR) 20 MG tablet Take 1 tablet (20 mg total) by mouth daily at 6 PM. Patient taking differently: Take 80 mg by mouth daily at 6 PM.  05/05/13 08/08/17 Yes Weaver, Scott T, PA-C  busPIRone (BUSPAR) 10 MG tablet Take 10 mg by mouth 3 (three) times daily.   Yes [provider]  carvedilol (COREG) 25 MG tablet Take 25 mg by mouth 2 (two) times daily with a meal.   Yes [provider]  digoxin (LANOXIN) 0.125 MG tablet Take 1 tablet (0.125 mg total) by mouth daily. 05/05/13 08/08/17 Yes Weaver, Scott T, PA-C  fenofibrate (TRICOR) 145 MG tablet Take 145 mg by mouth daily.   Yes [provider]  FLUoxetine (PROZAC) 20 MG capsule Take 80 mg by mouth daily.    Yes [provider]  furosemide (LASIX) 40 MG tablet Take 1.5 tablets (60 mg total) by mouth 2 (two) times daily. Patient taking differently: Take 60 mg by mouth daily.  05/05/13 08/08/17 Yes Weaver, Scott T, PA-C  hydrOXYzine (ATARAX/VISTARIL) 50 MG tablet Take 50 mg by mouth 3 (three) times daily.   Yes [provider]  Insulin Glargine (LANTUS) 100 UNIT/ML Solostar Pen Inject 50 Units into the skin 2 (two) times daily.  05/26/13  Yes Renato Shin, MD  insulin regular (NOVOLIN R,HUMULIN R) 100 units/mL injection Inject 45 Units into the skin 2 (two) times daily before a meal.   Yes [provider]  lisinopril (PRINIVIL,ZESTRIL) 40 MG tablet Take 1 tablet (40 mg total) by mouth daily. Patient taking differently: Take 20 mg by mouth daily.  05/05/13  Yes Weaver, Scott T, PA-C    nitroGLYCERIN (NITROSTAT) 0.4 MG SL tablet Place 0.4 mg under the tongue every 5 (five) minutes as needed for chest pain.   Yes [provider]  omeprazole (PRILOSEC) 40 MG capsule Take 1 capsule (40 mg total) by mouth daily. Patient taking differently: Take 80 mg by mouth daily.  08/18/13  Yes Larey Dresser, MD  potassium chloride SA (K-DUR,KLOR-CON) 20 MEQ tablet Take 2 tablets (40 mEq total) by mouth daily. Patient taking differently: Take 20 mEq by mouth daily.  05/05/13 08/08/17 Yes Weaver, Scott T, PA-C  amLODipine (NORVASC) 5 MG tablet Take 1  tablet (5 mg total) by mouth daily. Patient not taking: Reported on 08/08/2017 05/05/13 01/11/15  Richardson Dopp T, PA-C  apixaban (ELIQUIS) 5 MG TABS tablet Take 1 tablet (5 mg total) by mouth 2 (two) times daily. Patient not taking: Reported on 08/08/2017 05/11/13   Larey Dresser, MD  methocarbamol (ROBAXIN) 500 MG tablet Take 1 tablet (500 mg total) by mouth 4 (four) times daily. Patient not taking: Reported on 08/08/2017 06/11/13   Marchia Bond, MD  metoprolol (TOPROL XL) 200 MG 24 hr tablet Take 1 tablet (200 mg total) by mouth daily. Patient not taking: Reported on 08/08/2017 05/06/13 11/29/14  Evans Lance, MD  omega-3 acid ethyl esters (LOVAZA) 1 G capsule Take 2 capsules (2 g total) by mouth 2 (two) times daily. Patient not taking: Reported on 08/08/2017 05/05/13   Richardson Dopp T, PA-C  pantoprazole (PROTONIX) 40 MG tablet Take 1 tablet (40 mg total) by mouth 2 (two) times daily. Patient not taking: Reported on 08/08/2017 08/18/13   Larey Dresser, MD  promethazine (PHENERGAN) 25 MG tablet Take 1 tablet (25 mg total) by mouth every 6 (six) hours as needed for nausea or vomiting. Patient not taking: Reported on 08/08/2017 06/11/13   Marchia Bond, MD  spironolactone (ALDACTONE) 25 MG tablet Take 1 tablet (25 mg total) by mouth daily. Patient not taking: Reported on 08/08/2017 05/05/13 05/05/14  Richardson Dopp T, PA-C    Physical Exam: Vitals:    08/07/17 2031 08/07/17 2350 08/08/17 0250 08/08/17 0320  BP: 134/78 (!) 134/93  130/76  Pulse: 78 82  82  Resp: 18 18  18   Temp:    98 F (36.7 C)  TempSrc:    Oral  SpO2: 100% 99%  96%  Weight:   116.5 kg (256 lb 13.4 oz)   Height:   6\' 3"  (1.905 m)       Constitutional: Moderately built and nourished. Vitals:   08/07/17 2031 08/07/17 2350 08/08/17 0250 08/08/17 0320  BP: 134/78 (!) 134/93  130/76  Pulse: 78 82  82  Resp: 18 18  18   Temp:    98 F (36.7 C)  TempSrc:    Oral  SpO2: 100% 99%  96%  Weight:   116.5 kg (256 lb 13.4 oz)   Height:   6\' 3"  (1.905 m)    Eyes: Anicteric no pallor. ENMT: No discharge from the ears eyes nose or mouth. Neck: No mass palpated no JVD appreciated. Respiratory: No rhonchi or crepitations. Cardiovascular: S1-S2 heard no murmurs appreciated. Abdomen: Soft nontender bowel sounds present. Musculoskeletal: Left leg ulceration has been dressed. Skin: Left leg ulcer has been dressed. Neurologic: Alert awake oriented to time place and person.  Moves all extremities. Psychiatric: Appears normal per normal affect.   Labs on Admission: I have personally reviewed following labs and imaging studies  CBC: Recent Labs  Lab 08/07/17 1825  WBC 6.4  HGB 15.1  HCT 43.7  MCV 79.7  PLT 528   Basic Metabolic Panel: Recent Labs  Lab 08/07/17 1825  NA 129*  K 4.7  CL 94*  CO2 26  GLUCOSE 621*  BUN 32*  CREATININE 1.76*  CALCIUM 8.8*   GFR: Estimated Creatinine Clearance: 70.6 mL/min (A) (by C-G formula based on SCr of 1.76 mg/dL (H)). Liver Function Tests: No results for input(s): AST, ALT, ALKPHOS, BILITOT, PROT, ALBUMIN in the last 168 hours. No results for input(s): LIPASE, AMYLASE in the last 168 hours. No results for input(s): AMMONIA in the  last 168 hours. Coagulation Profile: No results for input(s): INR, PROTIME in the last 168 hours. Cardiac Enzymes: No results for input(s): CKTOTAL, CKMB, CKMBINDEX, TROPONINI in the last  168 hours. BNP (last 3 results) No results for input(s): PROBNP in the last 8760 hours. HbA1C: No results for input(s): HGBA1C in the last 72 hours. CBG: Recent Labs  Lab 08/07/17 2059 08/07/17 2219 08/08/17 0101 08/08/17 0405  GLUCAP 529* 516* 481* 420*   Lipid Profile: No results for input(s): CHOL, HDL, LDLCALC, TRIG, CHOLHDL, LDLDIRECT in the last 72 hours. Thyroid Function Tests: No results for input(s): TSH, T4TOTAL, FREET4, T3FREE, THYROIDAB in the last 72 hours. Anemia Panel: No results for input(s): VITAMINB12, FOLATE, FERRITIN, TIBC, IRON, RETICCTPCT in the last 72 hours. Urine analysis:    Component Value Date/Time   COLORURINE YELLOW 10/14/2011 1924   APPEARANCEUR CLEAR 10/14/2011 1924   LABSPEC 1.029 10/14/2011 1924   PHURINE 6.0 10/14/2011 1924   GLUCOSEU 250 (A) 10/14/2011 1924   HGBUR LARGE (A) 10/14/2011 [place] NEGATIVE 10/14/2011 1924   KETONESUR NEGATIVE 10/14/2011 1924   PROTEINUR >300 (A) 10/14/2011 1924   UROBILINOGEN 1.0 10/14/2011 1924   NITRITE NEGATIVE 10/14/2011 1924   LEUKOCYTESUR NEGATIVE 10/14/2011 1924   Sepsis Labs: @LABRCNTIP (procalcitonin:4,lacticidven:4) )No results found for this or any previous visit (from the past 240 hour(s)).   Radiological Exams on Admission: Dg Tibia/fibula Left  Result Date: 08/07/2017 CLINICAL DATA:  Ankle wound from monitor. EXAM: LEFT TIBIA AND FIBULA - 2 VIEW COMPARISON:  None. FINDINGS: No fracture. No subluxation. No worrisome lytic or sclerotic osseous abnormality. No radiopaque soft tissue foreign body in the distal leg. No evidence for soft tissue gas in the distal leg. IMPRESSION: Negative. Electronically Signed   By: Misty Stanley M.D.   On: 08/07/2017 19:15     Assessment/Plan Principal Problem:   Cellulitis, leg Active Problems:   Essential hypertension, benign   Atrial fibrillation (HCC)   Chronic systolic congestive heart failure, NYHA class 2 (HCC)   Implantable  cardioverter-defibrillator (ICD) in situ   Cellulitis   Uncontrolled type 2 diabetes mellitus with hyperglycemia ([place])    1. Left leg ulceration with cellulitis -patient has been placed on vancomycin and Cipro.  Will get CT of the left leg since MRI cannot be done due to defibrillator.  Follow cultures wound team consult. 2. Diabetes mellitus type 2 uncontrolled with severe hyperglycemia likely from patient not taking his Lantus and scheduled regular insulin yesterday.  Patient did receive his Lantus at the ER and I have given 1 dose of NovoLog.  Will check metabolic panel closely for any development of DKA.  Patient states his last hemoglobin A1c was around 8. 3. History of paroxysmal atrial fibrillation on Eliquis along with Coreg and digoxin.  Check digoxin levels. 4. History of cardiomyopathy status post AICD placement on Lasix and spironolactone but will hold Lasix and spironolactone for now until we get repeat metabolic panel. 5. Chronic kidney disease stage II with mild worsening of his creatinine from baseline.  Follow metabolic panel closely.  Did receive fluids in the ER.  Holding Lasix for now.   DVT prophylaxis: Apixaban. Code Status: Full code. Family Communication: Discussed with patient. Disposition Plan: Home. Consults called: Wound team. Admission status: Inpatient.   Rise Patience MD [facility] Pager [phone].  If 7PM-7AM, please contact night-coverage [email or web address] Password [facility]  08/08/2017, 4:35 AM

## 2017-08-09 DIAGNOSIS — I5022 Chronic systolic (congestive) heart failure: Secondary | ICD-10-CM

## 2017-08-09 DIAGNOSIS — I10 Essential (primary) hypertension: Secondary | ICD-10-CM

## 2017-08-09 DIAGNOSIS — I4891 Unspecified atrial fibrillation: Secondary | ICD-10-CM

## 2017-08-09 DIAGNOSIS — E1165 Type 2 diabetes mellitus with hyperglycemia: Secondary | ICD-10-CM

## 2017-08-09 DIAGNOSIS — Z95810 Presence of automatic (implantable) cardiac defibrillator: Secondary | ICD-10-CM

## 2017-08-09 LAB — GLUCOSE, CAPILLARY
GLUCOSE-CAPILLARY: 335 mg/dL — AB (ref 70–99)
GLUCOSE-CAPILLARY: 223 mg/dL — AB (ref 70–99)

## 2017-08-09 MED ORDER — TRAMADOL HCL 50 MG PO TABS: 50.0000 mg | ORAL_TABLET | Freq: Four times a day (QID) | ORAL | 0 refills | Status: DC | PRN

## 2017-08-09 MED ORDER — INSULIN PEN NEEDLE 31G X 5 MM MISC: 3 refills | Status: DC

## 2017-08-09 MED ORDER — LISINOPRIL 20 MG PO TABS: 20.0000 mg | ORAL_TABLET | Freq: Every day | ORAL | Status: DC

## 2017-08-09 MED ORDER — METHOCARBAMOL 500 MG PO TABS: 500.0000 mg | ORAL_TABLET | Freq: Three times a day (TID) | ORAL | 0 refills | Status: DC | PRN

## 2017-08-09 MED ORDER — INSULIN ASPART 100 UNIT/ML FLEXPEN: 15.0000 [IU] | PEN_INJECTOR | Freq: Three times a day (TID) | SUBCUTANEOUS | 0 refills | Status: DC

## 2017-08-09 MED ORDER — INSULIN ASPART 100 UNIT/ML FLEXPEN: 0.0000 [IU] | PEN_INJECTOR | Freq: Three times a day (TID) | SUBCUTANEOUS | Status: DC

## 2017-08-09 MED ORDER — CLINDAMYCIN HCL 300 MG PO CAPS: 300.0000 mg | ORAL_CAPSULE | Freq: Four times a day (QID) | ORAL | 0 refills | Status: AC

## 2017-08-09 NOTE — Progress Notes (Signed)
Pt discharged to home. DC instructions given with male family member and son at bedside. Pt encouraged to stop by pharmacy and pick up prescription that was e-prescribed. Voiced understanding. Left unit in wheelchair pushed by nurse tech. Left in stable condition.  Hale Bogus.

## 2017-08-09 NOTE — Care Management Note (Signed)
Case Management Note  Patient Details  Name: Corey Palmer MRN: [id number] Date of Birth: 03/26/1970  Subjective/Objective: 47 yo M admitted with L lower extremity pain and ulceration with cellulitis.        Action/Plan: Received referral to assist with meds   Expected Discharge Date:  08/09/17               Expected Discharge Plan:  Home/Self Care  In-House Referral:     Discharge planning Services  CM Consult  Post Acute Care Choice:    Choice offered to:     DME Arranged:    DME Agency:     HH Arranged:    HH Agency:     Status of Service:  Completed, signed off  If discussed at [employer] of Stay Meetings, dates discussed:    Additional Comments: Met with pt and family. He plans to return home with the support of his family. He reports that he is working and he is going to be able to get medical insurance in 5 weeks. He doesn't have a PCP. He obtains his meds through [facility] discount card at [employer]. He reports that he still has Lantus insulin. He ran out of regular insulin while he was in jail. Assisted pt with his prescriptions through the [facility] program. Provided pt with a [place] brochure to make an appointment for a PCP. Discussed with pt the importance of calling Monday morning for f/u. He verbalized understanding.   Norina Buzzard, RN 08/09/2017, 3:03 PM

## 2017-08-09 NOTE — Discharge Summary (Signed)
Physician Discharge Summary  Corey Palmer VEH:[id number] DOB: Jun 22, 1970 DOA: 08/07/2017  PCP: Patient, No Pcp Per  Admit date: 08/07/2017 Discharge date: 08/09/2017  Admitted From: Home Disposition: Home  Recommendations for Outpatient Follow-up:  1. Follow up with PCP in 2 weeks 2. Please obtain BMP/CBC in one week 3. Please follow up on the following pending results: Blood cultures (final result)  Home Health: RN Equipment/Devices: None  Discharge Condition: Stable CODE STATUS: Full code Diet recommendation: Carb modified   Brief/Interim Summary:  Admission HPI written by Rise Patience, MD   Chief Complaint: Left lower extremity pain and ulceration.  HPI: Corey Palmer is a 47 y.o. male with history of cardiomyopathy status post AICD placement, diabetes mellitus type 2, paroxysmal atrial fibrillation, chronic kidney disease stage II presents to the ER at [facility] with worsening wound in the left ankle area over the last 1 week.  Patient was wearing a device which was vibrating whole day last week and started developing a wound over there.  Since then it started forming a blister and ulcerated.  Denying some purulent discharge.  Has had some subjective feeling of fever chills.  Yesterday patient went was trying to walk after he woke up from the bed his left leg gave away and had a fall but did not lose consciousness or hit his head.  Patient presented to the ER at [facility].  Has not taken his Lantus dose yesterday.  ED Course: In the ER his blood sugar was elevated at 600 which improved to 400 after giving his Lantus dose.  X-ray of the left tibia does not show any bony involvement.  On exam patient has an ulceration involving the left distal aspect with active discharge.  Patient was placed on vancomycin and admitted for further management of cellulitis.   Hospital course:  Left lower leg ulcer with cellulitis Initially started on  Vancomycin and Ciprofloxacin, transitioned to Clindamycin. CT significant for no MRI. No wound culture obtained. Blood culture no growth x<24 hours. Wound care consulted. Outpatient [facility] ([facility] RN ordered.  Diabetes mellitus, type 2 Uncontrolled. On insulin. Patient is non-adherent with treatment. Discharged on Lantus 50 units BID, Novolog 15 units TID WC and sliding scale regimen.  Paroxysmal atrial fibrillation Continued Eliquis, Coreg and digoxin  Cardiomyopathy Chronic systolic heart failure Last Transthoracic Echocardiogram significant for an EF of 20-25%. S/p AICD. Continue lasix and resume potassium supplementation on discharge.  Chronic kidney disease stage 2 IV fluids given. Improved. Outpatient follow-up.   Discharge Diagnoses:  Principal Problem:   Cellulitis, leg Active Problems:   Essential hypertension, benign   Atrial fibrillation (HCC)   Chronic systolic congestive heart failure, NYHA class 2 (HCC)   Implantable cardioverter-defibrillator (ICD) in situ   Cellulitis   Uncontrolled type 2 diabetes mellitus with hyperglycemia ([name])    Discharge Instructions   Allergies as of 08/09/2017      Reactions   Orange Fruit Anaphylaxis   Penicillins Anaphylaxis      Medication List    STOP taking these medications   amLODipine 5 MG tablet Commonly known as:  NORVASC   insulin regular 100 units/mL injection Commonly known as:  NOVOLIN R,HUMULIN R   metoprolol 200 MG 24 hr tablet Commonly known as:  TOPROL XL   omega-3 acid ethyl esters 1 g capsule Commonly known as:  LOVAZA   pantoprazole 40 MG tablet Commonly known as:  PROTONIX   promethazine 25 MG tablet Commonly  known as:  PHENERGAN   spironolactone 25 MG tablet Commonly known as:  ALDACTONE     TAKE these medications   apixaban 5 MG Tabs tablet Commonly known as:  ELIQUIS Take 1 tablet (5 mg total) by mouth 2 (two) times daily.   atorvastatin 20 MG tablet Commonly known as:  LIPITOR Take 1 tablet  (20 mg total) by mouth daily at 6 PM. What changed:  how much to take   busPIRone 10 MG tablet Commonly known as:  BUSPAR Take 10 mg by mouth 3 (three) times daily.   carvedilol 25 MG tablet Commonly known as:  COREG Take 25 mg by mouth 2 (two) times daily with a meal.   clindamycin 300 MG capsule Commonly known as:  CLEOCIN Take 1 capsule (300 mg total) by mouth 4 (four) times daily for 6 days.   digoxin 0.125 MG tablet Commonly known as:  LANOXIN Take 1 tablet (0.125 mg total) by mouth daily.   fenofibrate 145 MG tablet Commonly known as:  TRICOR Take 145 mg by mouth daily.   FLUoxetine 20 MG capsule Commonly known as:  PROZAC Take 80 mg by mouth daily.   furosemide 40 MG tablet Commonly known as:  LASIX Take 1.5 tablets (60 mg total) by mouth 2 (two) times daily. What changed:  when to take this   hydrOXYzine 50 MG tablet Commonly known as:  ATARAX/VISTARIL Take 50 mg by mouth 3 (three) times daily.   insulin aspart 100 UNIT/ML FlexPen Commonly known as:  NOVOLOG FLEXPEN Inject 15 Units into the skin 3 (three) times daily with meals.   insulin aspart 100 UNIT/ML FlexPen Commonly known as:  NOVOLOG FLEXPEN Inject 0-9 Units into the skin 3 (three) times daily with meals. Per sliding scale:  Blood sugar                 Units of insulin CBG 70 - 120:                   0 units CBG 121 - 150:                 1 unit CBG 151 - 200:                 2 units CBG 201 - 250:                 3 units CBG 251 - 300:                 5 units CBG 301 - 350:                 7 units CBG 351 - 400:                 9 units   Insulin Glargine 100 UNIT/ML Solostar Pen Commonly known as:  LANTUS Inject 50 Units into the skin 2 (two) times daily.   lisinopril 20 MG tablet Commonly known as:  PRINIVIL,ZESTRIL Take 1 tablet (20 mg total) by mouth daily. What changed:    medication strength  how much to take   methocarbamol 500 MG tablet Commonly known as:  ROBAXIN Take 1  tablet (500 mg total) by mouth every 8 (eight) hours as needed for muscle spasms. What changed:    when to take this  reasons to take this   nitroGLYCERIN 0.4 MG SL tablet Commonly known as:  NITROSTAT Place 0.4 mg under the tongue every 5 (five)  minutes as needed for chest pain.   omeprazole 40 MG capsule Commonly known as:  PRILOSEC Take 1 capsule (40 mg total) by mouth daily. What changed:  how much to take   potassium chloride SA 20 MEQ tablet Commonly known as:  K-DUR,KLOR-CON Take 2 tablets (40 mEq total) by mouth daily. What changed:  how much to take   traMADol 50 MG tablet Commonly known as:  ULTRAM Take 1 tablet (50 mg total) by mouth every 6 (six) hours as needed for severe pain.       Allergies  Allergen Reactions  . Orange Fruit Anaphylaxis  . Penicillins Anaphylaxis    Consultations:  None   Procedures/Studies: Dg Tibia/fibula Left  Result Date: 08/07/2017 CLINICAL DATA:  Ankle wound from monitor. EXAM: LEFT TIBIA AND FIBULA - 2 VIEW COMPARISON:  None. FINDINGS: No fracture. No subluxation. No worrisome lytic or sclerotic osseous abnormality. No radiopaque soft tissue foreign body in the distal leg. No evidence for soft tissue gas in the distal leg. IMPRESSION: Negative. Electronically Signed   By: Misty Stanley M.D.   On: 08/07/2017 19:15   Ct Tibia Fibula Left Wo Contrast  Result Date: 08/08/2017 CLINICAL DATA:  Open wound on the left distal lower leg extending into the ankle joint. Evaluate for osteomyelitis. EXAM: CT OF THE LOWER LEFT EXTREMITY WITHOUT CONTRAST TECHNIQUE: Multidetector CT imaging of the lower left extremity was performed according to the standard protocol. COMPARISON:  None. FINDINGS: Bones/Joint/Cartilage No fracture or dislocation. Normal alignment. No joint effusion. No periosteal reaction or bone destruction. No aggressive osseous lesion. Ankle mortise is intact. Mild osteoarthritis of the tibiotalar joint. Mild tricompartmental  osteoarthritis of the left knee. Ligaments Ligaments are suboptimally evaluated by CT. Muscles and Tendons Muscles are normal. No muscle atrophy. Visualized flexor, extensor, peroneal and Achilles tendons are grossly intact. Soft tissue No fluid collection or hematoma.  No soft tissue mass. IMPRESSION: 1. No osteomyelitis of the left tibia or fibula. Electronically Signed   By: Kathreen Devoid   On: 08/08/2017 10:15      Subjective: Afebrile.  Discharge Exam: Vitals:   08/08/17 2149 08/09/17 0531  BP: (!) 143/92 (!) 114/98  Pulse: 85 82  Resp: 20 18  Temp: 98.2 F (36.8 C) 98.4 F (36.9 C)  SpO2: 96% 98%   Vitals:   08/08/17 0554 08/08/17 1329 08/08/17 2149 08/09/17 0531  BP: 137/87 103/66 (!) 143/92 (!) 114/98  Pulse: 83 81 85 82  Resp: 18 16 20 18   Temp: (!) 97.5 F (36.4 C) 98.2 F (36.8 C) 98.2 F (36.8 C) 98.4 F (36.9 C)  TempSrc: Oral Oral Oral Oral  SpO2: 99% 95% 96% 98%  Weight:    115.6 kg (254 lb 13.6 oz)  Height:        General: Pt is alert, awake, not in acute distress Extremities: no edema, no cyanosis. Left lower leg wound with mild cellulitis and no drainage    The results of significant diagnostics from this hospitalization (including imaging, microbiology, ancillary and laboratory) are listed below for reference.     Microbiology: No results found for this or any previous visit (from the past 240 hour(s)).   Labs: BNP (last 3 results) No results for input(s): BNP in the last 8760 hours. Basic Metabolic Panel: Recent Labs  Lab 08/07/17 1825 08/08/17 0501  NA 129* 134*  K 4.7 3.7  CL 94* 100  CO2 26 25  GLUCOSE 621* 482*  BUN 32* 32*  CREATININE 1.76* 1.46*  CALCIUM 8.8* 8.2*   Liver Function Tests: No results for input(s): AST, ALT, ALKPHOS, BILITOT, PROT, ALBUMIN in the last 168 hours. No results for input(s): LIPASE, AMYLASE in the last 168 hours. No results for input(s): AMMONIA in the last 168 hours. CBC: Recent Labs  Lab  08/07/17 1825 08/08/17 0501  WBC 6.4 4.7  NEUTROABS  --  2.9  HGB 15.1 12.5*  HCT 43.7 37.2*  MCV 79.7 80.7  PLT 255 214   Cardiac Enzymes: Recent Labs  Lab 08/08/17 0501  CKTOTAL 45*   BNP: Invalid input(s): POCBNP CBG: Recent Labs  Lab 08/08/17 0756 08/08/17 1111 08/08/17 1704 08/08/17 2152 08/09/17 0730  GLUCAP 395* 197* 283* 212* 335*   D-Dimer No results for input(s): DDIMER in the last 72 hours. Hgb A1c Recent Labs    08/08/17 1118  HGBA1C 12.9*   Lipid Profile No results for input(s): CHOL, HDL, LDLCALC, TRIG, CHOLHDL, LDLDIRECT in the last 72 hours. Thyroid function studies No results for input(s): TSH, T4TOTAL, T3FREE, THYROIDAB in the last 72 hours.  Invalid input(s): FREET3 Anemia work up No results for input(s): VITAMINB12, FOLATE, FERRITIN, TIBC, IRON, RETICCTPCT in the last 72 hours. Urinalysis    Component Value Date/Time   COLORURINE YELLOW 10/14/2011 1924   APPEARANCEUR CLEAR 10/14/2011 1924   LABSPEC 1.029 10/14/2011 1924   PHURINE 6.0 10/14/2011 1924   GLUCOSEU 250 (A) 10/14/2011 1924   HGBUR LARGE (A) 10/14/2011 [place] NEGATIVE 10/14/2011 [place] NEGATIVE 10/14/2011 1924   PROTEINUR >300 (A) 10/14/2011 1924   UROBILINOGEN 1.0 10/14/2011 1924   NITRITE NEGATIVE 10/14/2011 1924   LEUKOCYTESUR NEGATIVE 10/14/2011 1924     SIGNED:   Cordelia Poche, MD [facility] 08/09/2017, 11:47 AM

## 2017-08-09 NOTE — Progress Notes (Signed)
Pt called after discharge reporting that he went to the pharmacy to pick up prescription medications e-prescribed by MD; but pt was told that the information on the medication assistance card, given to him by Care management, did not match his personal information. He therefore could not get his medications since they cost greater than $300.00 without the card. Care manager had left for the day. Called and left a message on the phone for them to f/u in the AM. Dr. Lonny Prude made aware of the prescription for the insulin pen needles. MD called and said that he sent the prescription to the pharmacy. Called pt and updated him.  Corey Palmer.

## 2017-08-09 NOTE — Discharge Instructions (Signed)
Corey Palmer,  You were admitted for an infected ulcer. You will go home on antibiotics. Please continue this regimen. Your diabetes will need close monitoring. Please follow-up with [facility] as you have planned.

## 2017-08-09 NOTE — Progress Notes (Signed)
RNCM following up with the request for Access to Medications.  No CSW needs identified.   Kathrin Greathouse, Marlinda Mike, MSW Clinical Social Worker  [phone] 08/09/2017  2:39 PM

## 2017-08-10 NOTE — Progress Notes (Signed)
Received call about patient not being able to get meds through our [facility] completed-patient called to come to hospital to pick up form. No further CM needs.

## 2017-08-10 NOTE — Progress Notes (Signed)
Received call from [facility] #[phone]-MATCH program not going through-MD-Dr. Lonny Prude agreed to call pharmacy for Novolog insulin 100u/ml vials, & syringes.Patient has $3 co pay.

## 2017-08-13 LAB — CULTURE, BLOOD (ROUTINE X 2)
Culture: NO GROWTH
Culture: NO GROWTH
SPECIAL REQUESTS: ADEQUATE
SPECIAL REQUESTS: ADEQUATE

## 2017-09-26 DIAGNOSIS — I10 Essential (primary) hypertension: Secondary | ICD-10-CM | POA: Diagnosis not present

## 2017-09-26 DIAGNOSIS — E1165 Type 2 diabetes mellitus with hyperglycemia: Secondary | ICD-10-CM | POA: Diagnosis not present

## 2017-09-26 DIAGNOSIS — F319 Bipolar disorder, unspecified: Secondary | ICD-10-CM | POA: Diagnosis not present

## 2017-09-26 DIAGNOSIS — Z23 Encounter for immunization: Secondary | ICD-10-CM | POA: Diagnosis not present

## 2017-10-03 DIAGNOSIS — E118 Type 2 diabetes mellitus with unspecified complications: Secondary | ICD-10-CM | POA: Diagnosis not present

## 2017-10-03 DIAGNOSIS — E1165 Type 2 diabetes mellitus with hyperglycemia: Secondary | ICD-10-CM | POA: Diagnosis not present

## 2017-10-03 DIAGNOSIS — I509 Heart failure, unspecified: Secondary | ICD-10-CM | POA: Diagnosis not present

## 2017-10-03 DIAGNOSIS — E782 Mixed hyperlipidemia: Secondary | ICD-10-CM | POA: Diagnosis not present

## 2017-10-17 ENCOUNTER — Encounter: Payer: Self-pay | Admitting: Endocrinology

## 2017-10-17 DIAGNOSIS — N289 Disorder of kidney and ureter, unspecified: Secondary | ICD-10-CM | POA: Diagnosis not present

## 2017-10-20 DIAGNOSIS — E118 Type 2 diabetes mellitus with unspecified complications: Secondary | ICD-10-CM | POA: Diagnosis not present

## 2017-10-20 DIAGNOSIS — E1165 Type 2 diabetes mellitus with hyperglycemia: Secondary | ICD-10-CM | POA: Diagnosis not present

## 2017-10-20 DIAGNOSIS — I509 Heart failure, unspecified: Secondary | ICD-10-CM | POA: Diagnosis not present

## 2017-10-20 DIAGNOSIS — E782 Mixed hyperlipidemia: Secondary | ICD-10-CM | POA: Diagnosis not present

## 2017-10-21 ENCOUNTER — Encounter (INDEPENDENT_AMBULATORY_CARE_PROVIDER_SITE_OTHER): Payer: Self-pay

## 2017-10-21 ENCOUNTER — Ambulatory Visit: Payer: BLUE CROSS/BLUE SHIELD | Admitting: Internal Medicine

## 2017-10-21 ENCOUNTER — Encounter: Payer: Self-pay | Admitting: Internal Medicine

## 2017-10-21 VITALS — BP 138/76 | HR 79 | Ht 75.0 in | Wt 245.0 lb

## 2017-10-21 DIAGNOSIS — I428 Other cardiomyopathies: Secondary | ICD-10-CM

## 2017-10-21 DIAGNOSIS — I5022 Chronic systolic (congestive) heart failure: Secondary | ICD-10-CM

## 2017-10-21 DIAGNOSIS — Z95810 Presence of automatic (implantable) cardiac defibrillator: Secondary | ICD-10-CM | POA: Diagnosis not present

## 2017-10-21 DIAGNOSIS — I4891 Unspecified atrial fibrillation: Secondary | ICD-10-CM

## 2017-10-21 DIAGNOSIS — I480 Paroxysmal atrial fibrillation: Secondary | ICD-10-CM

## 2017-10-21 DIAGNOSIS — I255 Ischemic cardiomyopathy: Secondary | ICD-10-CM

## 2017-10-21 DIAGNOSIS — N529 Male erectile dysfunction, unspecified: Secondary | ICD-10-CM

## 2017-10-21 DIAGNOSIS — I2510 Atherosclerotic heart disease of native coronary artery without angina pectoris: Secondary | ICD-10-CM

## 2017-10-21 LAB — CUP PACEART INCLINIC DEVICE CHECK
Battery Remaining Longevity: 93 mo
Brady Statistic RV Percent Paced: 0 %
HighPow Impedance: 65.25 Ohm
Implantable Lead Location: 753860
Lead Channel Impedance Value: 487.5 Ohm
Lead Channel Pacing Threshold Amplitude: 0.75 V
Lead Channel Sensing Intrinsic Amplitude: 11.4 mV
Lead Channel Setting Pacing Amplitude: 2.5 V
Lead Channel Setting Pacing Pulse Width: 0.5 ms
Lead Channel Setting Sensing Sensitivity: 0.5 mV
MDC IDC LEAD IMPLANT DT: 20090817
MDC IDC MSMT LEADCHNL RV PACING THRESHOLD AMPLITUDE: 0.75 V
MDC IDC MSMT LEADCHNL RV PACING THRESHOLD PULSEWIDTH: 0.5 ms
MDC IDC MSMT LEADCHNL RV PACING THRESHOLD PULSEWIDTH: 0.5 ms
MDC IDC PG IMPLANT DT: 20180419
MDC IDC SESS DTM: 20190924164441
Pulse Gen Serial Number: 7411315

## 2017-10-21 MED ORDER — SILDENAFIL CITRATE 20 MG PO TABS: 40.0000 mg | ORAL_TABLET | Freq: Every day | ORAL | 1 refills | Status: DC | PRN

## 2017-10-21 MED ORDER — ATORVASTATIN CALCIUM 80 MG PO TABS: 80.0000 mg | ORAL_TABLET | Freq: Every day | ORAL | 3 refills | Status: DC

## 2017-10-21 MED ORDER — APIXABAN 5 MG PO TABS: 5.0000 mg | ORAL_TABLET | Freq: Two times a day (BID) | ORAL | 3 refills | Status: DC

## 2017-10-21 MED ORDER — FUROSEMIDE 20 MG PO TABS: 60.0000 mg | ORAL_TABLET | Freq: Every day | ORAL | 3 refills | Status: DC

## 2017-10-21 MED ORDER — DIGOXIN 125 MCG PO TABS: 0.1250 mg | ORAL_TABLET | Freq: Every day | ORAL | 3 refills | Status: DC

## 2017-10-21 MED ORDER — LISINOPRIL 20 MG PO TABS: 20.0000 mg | ORAL_TABLET | Freq: Every day | ORAL | 3 refills | Status: DC

## 2017-10-21 MED ORDER — CARVEDILOL 25 MG PO TABS: 25.0000 mg | ORAL_TABLET | Freq: Two times a day (BID) | ORAL | 3 refills | Status: DC

## 2017-10-21 MED ORDER — POTASSIUM CHLORIDE CRYS ER 20 MEQ PO TBCR: 20.0000 meq | EXTENDED_RELEASE_TABLET | Freq: Every day | ORAL | 3 refills | Status: DC

## 2017-10-21 NOTE — Progress Notes (Signed)
HPI [name] returns today to re-establish after a 4year absence from our arrhythmia clinic. He was incarcerated. He underwent ICD gen change while in prison. He has not had any ICD shock and denies chest pain or sob. 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 He is now compliant with his meds. He notes that his EF has improved.  Allergies  Allergen Reactions  . Orange Fruit Anaphylaxis  . Penicillins Anaphylaxis     Current Outpatient Medications  Medication Sig Dispense Refill  . apixaban (ELIQUIS) 5 MG TABS tablet Take 1 tablet (5 mg total) by mouth 2 (two) times daily. 180 tablet 3  . atorvastatin (LIPITOR) 80 MG tablet Take 1 tablet (80 mg total) by mouth daily. 90 tablet 3  . busPIRone (BUSPAR) 10 MG tablet Take 10 mg by mouth 3 (three) times daily.    . carvedilol (COREG) 25 MG tablet Take 1 tablet (25 mg total) by mouth 2 (two) times daily with a meal. 180 tablet 3  . fenofibrate (TRICOR) 145 MG tablet Take 145 mg by mouth daily.    Marland Kitchen FLUoxetine (PROZAC) 20 MG capsule Take 80 mg by mouth daily.     . hydrOXYzine (ATARAX/VISTARIL) 50 MG tablet Take 50 mg by mouth 3 (three) times daily.    . insulin aspart (NOVOLOG FLEXPEN) 100 UNIT/ML FlexPen Inject 15 Units into the skin 3 (three) times daily with meals. 30 mL 0  . insulin aspart (NOVOLOG FLEXPEN) 100 UNIT/ML FlexPen Inject 0-9 Units into the skin 3 (three) times daily with meals. Per sliding scale:  Blood sugar                 Units of insulin CBG 70 - 120:                   0 units CBG 121 - 150:                 1 unit CBG 151 - 200:                 2 units CBG 201 - 250:                 3 units CBG 251 - 300:                 5 units CBG 301 - 350:                 7 units CBG 351 - 400:                 9 units    . Insulin Glargine (LANTUS) 100 UNIT/ML Solostar Pen Inject 50 Units into the skin 2 (two) times daily.     . Insulin Pen Needle 31G X 5 MM MISC BD Pen Needles- brand specific Inject  insulin via insulin pen 4x daily 200 each 3  . lisinopril (PRINIVIL,ZESTRIL) 20 MG tablet Take 1 tablet (20 mg total) by mouth daily. 90 tablet 3  . methocarbamol (ROBAXIN) 500 MG tablet Take 1 tablet (500 mg total) by mouth every 8 (eight) hours as needed for muscle spasms. 30 tablet 0  . nitroGLYCERIN (NITROSTAT) 0.4 MG SL tablet Place 0.4 mg under the tongue every 5 (five) minutes as needed for chest pain.    Marland Kitchen omeprazole (PRILOSEC) 40 MG capsule Take 1 capsule (40 mg total) by mouth daily. (Patient taking differently: Take 80 mg by mouth daily. )  30 capsule 3  . traMADol (ULTRAM) 50 MG tablet Take 1 tablet (50 mg total) by mouth every 6 (six) hours as needed for severe pain. 10 tablet 0  . digoxin (LANOXIN) 0.125 MG tablet Take 1 tablet (0.125 mg total) by mouth daily. 90 tablet 3  . furosemide (LASIX) 20 MG tablet Take 3 tablets (60 mg total) by mouth daily. 270 tablet 3  . potassium chloride SA (K-DUR,KLOR-CON) 20 MEQ tablet Take 1 tablet (20 mEq total) by mouth daily. 90 tablet 3  . sildenafil (REVATIO) 20 MG tablet Take 2-3 tablets (40-60 mg total) by mouth daily as needed (as needed for erectile dysfunction). 50 tablet 1   No current facility-administered medications for this visit.      Past Medical History:  Diagnosis Date  . Anginal pain ([place])   . Anxiety   . Arthritis   . Automatic implantable cardioverter-defibrillator in situ    Medtronic   . Bipolar disorder ([place])   . CAD (coronary artery disease)    Cardiac cath (08/19/2000) - Nonobstructive, 40% stenosis of LAD,.   . Cancer [facility]) 2013   benign stomach cancer  . CHF (congestive heart failure) ([place])   . Chondrocalcinosis of right knee 06/11/2013  . Chronic systolic heart failure ([place])    2D echo (24/5809) - Systolic function severely reduced., LV EF 20-25%. Akinesis of apical and anteroseptal mycoardium.  last 2D echo (11/2008 ), LV EF 25-30%, diffuse hypokinesis, and grade 1 diastolic dysfunction.  . Depression   .  Diabetes uncomplicated adult-type II    on insulin therapy, a1c 12.9 in 01/2011  . Dilated cardiomyopathy ([place])    status post ICD placement in 2008 c/b tearing at the atrial junction resulting in tamponade and urgent thoracotomy  . Dyslipidemia   . Dysrhythmia   . GERD (gastroesophageal reflux disease)   . Gout, unspecified   . Hepatic steatosis 2011   seen on ultrasound.   . History of kidney stones   . Hx of echocardiogram    Echo (04/2013):  Mild LVH, EF 20-25%, mild LAE, mild to mod RVE with mild reduced RVSF.  Marland Kitchen Hypertension   . ICD (implantable cardiac defibrillator) in place 2007  . Myocardial infarction ([place]) 2008  . Obesity (BMI 30.0-34.9)   . Pacemaker   . Paroxysmal atrial fibrillation (HCC)    on chronic coumadin, goal INR 2-3. status post direct current  . Peptic ulcer   . Shortness of breath   . Sleep apnea    does not have CPAP    ROS:   All systems reviewed and negative except as noted in the HPI.   Past Surgical History:  Procedure Laterality Date  . CARDIAC CATHETERIZATION    . CARDIAC PACEMAKER PLACEMENT    . CHOLECYSTECTOMY    . COLONOSCOPY    . CORONARY ARTERY BYPASS GRAFT    . ESOPHAGOGASTRODUODENOSCOPY  04/05/2011   Procedure: ESOPHAGOGASTRODUODENOSCOPY (EGD);  Surgeon: Irene Shipper, MD;  Location: [facility] ENDOSCOPY;  Service: Endoscopy;  Laterality: N/A;  . JOINT REPLACEMENT     left knee replacement  . KNEE ARTHROSCOPY Right 06/11/2013   Procedure: RIGHT KNEE ARTHROSCOPY with chondroplasty;  Surgeon: Johnny Bridge, MD;  Location: [place];  Service: Orthopedics;  Laterality: Right;  . TOTAL KNEE ARTHROPLASTY Left      Family History  Problem Relation Age of Onset  . Diabetes Mother   . Coronary artery disease Mother 59  . Gout Mother   . Heart disease Mother   .  Heart attack Mother 76  . Diabetes Father   . Coronary artery disease Father 75       Died in a house fire   . Heart disease Father   . Heart disease Brother      Social History    Socioeconomic History  . Marital status: Married    Spouse name: Not on file  . Number of children: 3  . Years of education: college  . Highest education level: Not on file  Occupational History  . Occupation: bar tender  Social Needs  . Financial resource strain: Not on file  . Food insecurity:    Worry: Not on file    Inability: Not on file  . Transportation needs:    Medical: Not on file    Non-medical: Not on file  Tobacco Use  . Smoking status: Never Smoker  . Smokeless tobacco: Never Used  Substance and Sexual Activity  . Alcohol use: No  . Drug use: No  . Sexual activity: Not on file  Lifestyle  . Physical activity:    Days per week: Not on file    Minutes per session: Not on file  . Stress: Not on file  Relationships  . Social connections:    Talks on phone: Not on file    Gets together: Not on file    Attends religious service: Not on file    Active member of club or organization: Not on file    Attends meetings of clubs or organizations: Not on file    Relationship status: Not on file  . Intimate partner violence:    Fear of current or ex partner: Not on file    Emotionally abused: Not on file    Physically abused: Not on file    Forced sexual activity: Not on file  Other Topics Concern  . Not on file  Social History Narrative   Lives in [place].   Studied sports medicine in college, got a BS - used to work with the [employer].           BP 138/76   Pulse 79   Ht 6\' 3"  (1.905 m)   Wt 245 lb (111.1 kg)   SpO2 97%   BMI 30.62 kg/m   Physical Exam:  Well appearing 47 yo man, NAD HEENT: Unremarkable Neck:  6 cm JVD, no thyromegally Lymphatics:  No adenopathy Back:  No CVA tenderness Lungs:  Clear with no wheezes HEART:  Regular rate rhythm, no murmurs, no rubs, no clicks Abd:  soft, positive bowel sounds, no organomegally, no rebound, no guarding Ext:  2 plus pulses, no edema, no cyanosis, no clubbing Skin:  No rashes no nodules Neuro:   CN II through XII intact, motor grossly intact  EKG - NSR with LAE  DEVICE  Normal device function.  See PaceArt for details.   Assess/Plan: 1. ICD - his St. Jude device is working normally. 2. Chronic systolic heart failure - his symptoms are class 2. He will continue his current meds. His EF has improved from 15 to 30%.  3. HTN - his bp is minimally elevated today and he is encouraged to lose weight and avoid salty foods. He is all in all much better.   Mikle Bosworth.D.

## 2017-10-21 NOTE — Patient Instructions (Addendum)
Medication Instructions:  Your physician has recommended you make the following change in your medication:  1.  Sildenafil 20 mg tablets- Take 2-3 tablets by mouth as needed daily.   Labwork: None ordered.  Testing/Procedures: None ordered.  Follow-Up: Your physician wants you to follow-up in: one year with Dr. Lovena Le.   You will receive a reminder letter in the mail two months in advance. If you don't receive a letter, please call our office to schedule the follow-up appointment.  Remote monitoring is used to monitor your ICD from home. This monitoring reduces the number of office visits required to check your device to one time per year. It allows [place] to keep an eye on the functioning of your device to ensure it is working properly. You are scheduled for a device check from home on 01/20/2018. You may send your transmission at any time that day. If you have a wireless device, the transmission will be sent automatically. After your physician reviews your transmission, you will receive a postcard with your next transmission date.  Any Other Special Instructions Will Be Listed Below (If Applicable).  If you need a refill on your cardiac medications before your next appointment, please call your pharmacy.

## 2017-10-22 ENCOUNTER — Telehealth: Payer: Self-pay | Admitting: *Deleted

## 2017-10-22 MED ORDER — CARVEDILOL 25 MG PO TABS: 25.0000 mg | ORAL_TABLET | Freq: Two times a day (BID) | ORAL | 3 refills | Status: DC

## 2017-10-22 NOTE — Addendum Note (Signed)
Addended by: Willeen Cass A on: 10/22/2017 01:53 PM   Modules accepted: Orders

## 2017-10-22 NOTE — Telephone Encounter (Signed)
LMOM to return call to [facility] about ICD remote monitor.   Need to confirm address and have [facility] send him a new monitor. He already has a cell adapter.

## 2017-10-24 ENCOUNTER — Telehealth: Payer: Self-pay

## 2017-10-24 NOTE — Telephone Encounter (Signed)
I have done a Sildenafil PA through covermymeds. Key: [id number]

## 2017-10-27 NOTE — Telephone Encounter (Signed)
**Note De-Identified Corey Palmer Obfuscation** Letter received Corey Palmer fax from [place] stating that they have denied coverage of Sildenafil 20 mg tablet. Reason: 20 mg dose is indicated for Pulmonary hypertension and not ED.  For the diagnosis of ED the dose will need to be Sildenafil 25 mg, 50 mg or 100 mg.

## 2017-10-28 MED ORDER — SILDENAFIL CITRATE 25 MG PO TABS: 50.0000 mg | ORAL_TABLET | Freq: Every day | ORAL | 3 refills | Status: DC | PRN

## 2017-10-28 NOTE — Telephone Encounter (Signed)
New monitor ordered via [place] representative for patient.

## 2017-10-28 NOTE — Telephone Encounter (Signed)
Changed dosage per Dr. Lovena Le.  Sildenafil 25 mg tablets- take 2-3 tablets by mouth daily as needed for ED.  Prescription resubmitted.

## 2017-10-28 NOTE — Telephone Encounter (Signed)
Pt returned [facility] phone call. His address is [place], [place] [address]

## 2017-11-03 ENCOUNTER — Ambulatory Visit: Payer: Self-pay | Admitting: Registered"

## 2017-11-05 DIAGNOSIS — I255 Ischemic cardiomyopathy: Secondary | ICD-10-CM | POA: Diagnosis not present

## 2017-11-05 DIAGNOSIS — I129 Hypertensive chronic kidney disease with stage 1 through stage 4 chronic kidney disease, or unspecified chronic kidney disease: Secondary | ICD-10-CM | POA: Diagnosis not present

## 2017-11-05 DIAGNOSIS — E1129 Type 2 diabetes mellitus with other diabetic kidney complication: Secondary | ICD-10-CM | POA: Diagnosis not present

## 2017-11-05 DIAGNOSIS — N183 Chronic kidney disease, stage 3 (moderate): Secondary | ICD-10-CM | POA: Diagnosis not present

## 2017-11-13 ENCOUNTER — Telehealth: Payer: Self-pay | Admitting: Internal Medicine

## 2017-11-13 MED ORDER — CARVEDILOL 25 MG PO TABS: 12.5000 mg | ORAL_TABLET | Freq: Two times a day (BID) | ORAL | 3 refills | Status: DC

## 2017-11-13 NOTE — Telephone Encounter (Signed)
  Pt c/o BP issue: STAT if pt c/o blurred vision, one-sided weakness or slurred speech  1. What are your last 5 BP readings? 77/51, 82/51  2. Are you having any other symptoms (ex. Dizziness, headache, blurred vision, passed out)? Everything looked bright, nauseated, not feeling well  3. What is your BP issue? Patient is concerned his BP is so low

## 2017-11-13 NOTE — Telephone Encounter (Signed)
Called pt per Myrtie Hawk RN/ Dr. Lovena Le and advised him to decrease his Coreg to 1/2 (12.5mg ) by mouth twice a day.. To continue to monitor his BP and HR and call us next week with the results but sooner if no improvement. Pt to be sure that he is drinking enough fluids. Pt verbalized his understanding and agrees.

## 2017-11-25 ENCOUNTER — Ambulatory Visit: Payer: Self-pay | Admitting: Registered"

## 2017-11-25 DIAGNOSIS — E118 Type 2 diabetes mellitus with unspecified complications: Secondary | ICD-10-CM | POA: Diagnosis not present

## 2017-11-25 DIAGNOSIS — I509 Heart failure, unspecified: Secondary | ICD-10-CM | POA: Diagnosis not present

## 2017-11-25 DIAGNOSIS — E1165 Type 2 diabetes mellitus with hyperglycemia: Secondary | ICD-10-CM | POA: Diagnosis not present

## 2017-11-25 DIAGNOSIS — E782 Mixed hyperlipidemia: Secondary | ICD-10-CM | POA: Diagnosis not present

## 2017-11-26 DIAGNOSIS — N189 Chronic kidney disease, unspecified: Secondary | ICD-10-CM | POA: Diagnosis not present

## 2017-11-26 DIAGNOSIS — N183 Chronic kidney disease, stage 3 (moderate): Secondary | ICD-10-CM | POA: Diagnosis not present

## 2017-12-10 ENCOUNTER — Ambulatory Visit: Payer: BLUE CROSS/BLUE SHIELD | Admitting: Endocrinology

## 2017-12-10 ENCOUNTER — Encounter: Payer: Self-pay | Admitting: Endocrinology

## 2017-12-10 VITALS — BP 148/92 | HR 92 | Ht 75.0 in | Wt 257.8 lb

## 2017-12-10 DIAGNOSIS — E1165 Type 2 diabetes mellitus with hyperglycemia: Secondary | ICD-10-CM | POA: Diagnosis not present

## 2017-12-10 DIAGNOSIS — I10 Essential (primary) hypertension: Secondary | ICD-10-CM | POA: Diagnosis not present

## 2017-12-10 DIAGNOSIS — Z794 Long term (current) use of insulin: Secondary | ICD-10-CM

## 2017-12-10 DIAGNOSIS — E1129 Type 2 diabetes mellitus with other diabetic kidney complication: Secondary | ICD-10-CM | POA: Diagnosis not present

## 2017-12-10 LAB — POCT GLYCOSYLATED HEMOGLOBIN (HGB A1C): HEMOGLOBIN A1C: 10.6 % — AB (ref 4.0–5.6)

## 2017-12-10 MED ORDER — DAPAGLIFLOZIN PROPANEDIOL 5 MG PO TABS: 5.0000 mg | ORAL_TABLET | Freq: Every day | ORAL | 3 refills | Status: DC

## 2017-12-10 NOTE — Patient Instructions (Addendum)
Your blood pressure is high today.  Please see your primary care provider soon, to have it rechecked. good diet and exercise significantly improve the control of your diabetes.  please let me know if you wish to be referred to a dietician.  high blood sugar is very risky to your health.  you should see an eye doctor and dentist every year.  It is very important to get all recommended vaccinations.  check your blood sugar 4 times a day: before the 3 meals, and at bedtime.  also check if you have symptoms of your blood sugar being too high or too low.  please keep a record of the readings and bring it to your next appointment here (or you can bring the meter itself).  You can write it on any piece of paper.  please call us sooner if your blood sugar goes below 70, or if you have a lot of readings over 200. Please continue the same insulins for now. We'll try to get the prior authorization, for the Ozempic. Also, please see 1 of our educators, for the [place] and Dexcom.  Please come back for a follow-up appointment in 1 month.

## 2017-12-10 NOTE — Progress Notes (Signed)
Subjective:    Patient ID: Corey Palmer, male    DOB: Jun 17, 1970, 47 y.o.   MRN: [id number]  HPI pt is referred by Dr Orland Mustard, for diabetes.  I last saw this pt in 2015.  Pt states DM was dx'ed in 2010; he has mild neuropathy of the lower extremities; he has associated CAD and renal insuff; he has been on insulin since 2011; pt says his diet and exercise are good; he has never had pancreatitis, pancreatic surgery, or DKA.  He had severe hypoglycemia in 2015.  He stopped jardiance, due to itching.  Ins declined farxiga.  He stopped Ozempic, as it needs PA.  He takes lantus, and humalog, 20 units 3 times a day (just before each meal).  He says cbg's are persistently in the 200's.  He was in the process of getting Omnipod, and wants to get that and Dexcom continuous glucose monitor.   Past Medical History:  Diagnosis Date  . Anginal pain ([place])   . Anxiety   . Arthritis   . Automatic implantable cardioverter-defibrillator in situ    Medtronic   . Bipolar disorder ([place])   . CAD (coronary artery disease)    Cardiac cath (08/19/2000) - Nonobstructive, 40% stenosis of LAD,.   . Cancer [facility]) 2013   benign stomach cancer  . CHF (congestive heart failure) ([place])   . Chondrocalcinosis of right knee 06/11/2013  . Chronic systolic heart failure ([place])    2D echo (98/9211) - Systolic function severely reduced., LV EF 20-25%. Akinesis of apical and anteroseptal mycoardium.  last 2D echo (11/2008 ), LV EF 25-30%, diffuse hypokinesis, and grade 1 diastolic dysfunction.  . Depression   . Diabetes uncomplicated adult-type II    on insulin therapy, a1c 12.9 in 01/2011  . Dilated cardiomyopathy ([place])    status post ICD placement in 2008 c/b tearing at the atrial junction resulting in tamponade and urgent thoracotomy  . Dyslipidemia   . Dysrhythmia   . GERD (gastroesophageal reflux disease)   . Gout, unspecified   . Hepatic steatosis 2011   seen on ultrasound.   . History of kidney stones   . Hx of  echocardiogram    Echo (04/2013):  Mild LVH, EF 20-25%, mild LAE, mild to mod RVE with mild reduced RVSF.  Marland Kitchen Hypertension   . ICD (implantable cardiac defibrillator) in place 2007  . Myocardial infarction ([place]) 2008  . Obesity (BMI 30.0-34.9)   . Pacemaker   . Paroxysmal atrial fibrillation ([facility])    on chronic coumadin, goal INR 2-3. status post direct current  . Peptic ulcer   . Shortness of breath   . Sleep apnea    does not have CPAP    Past Surgical History:  Procedure Laterality Date  . CARDIAC CATHETERIZATION    . CARDIAC PACEMAKER PLACEMENT    . CHOLECYSTECTOMY    . COLONOSCOPY    . CORONARY ARTERY BYPASS GRAFT    . ESOPHAGOGASTRODUODENOSCOPY  04/05/2011   Procedure: ESOPHAGOGASTRODUODENOSCOPY (EGD);  Surgeon: Irene Shipper, MD;  [facility] ENDOSCOPY;  Service: Endoscopy;  Laterality: N/A;  . JOINT REPLACEMENT     left knee replacement  . KNEE ARTHROSCOPY Right 06/11/2013   Procedure: RIGHT KNEE ARTHROSCOPY with chondroplasty;  Surgeon: Johnny Bridge, MD;  Location: [place];  Service: Orthopedics;  Laterality: Right;  . TOTAL KNEE ARTHROPLASTY Left     Social History   Socioeconomic History  . Marital status: Married    Spouse name: Not on  file  . Number of children: 3  . Years of education: college  . Highest education level: Not on file  Occupational History  . Occupation: bar tender  Social Needs  . Financial resource strain: Not on file  . Food insecurity:    Worry: Not on file    Inability: Not on file  . Transportation needs:    Medical: Not on file    Non-medical: Not on file  Tobacco Use  . Smoking status: Never Smoker  . Smokeless tobacco: Never Used  Substance and Sexual Activity  . Alcohol use: No  . Drug use: No  . Sexual activity: Not on file  Lifestyle  . Physical activity:    Days per week: Not on file    Minutes per session: Not on file  . Stress: Not on file  Relationships  . Social connections:    Talks on phone: Not on file     Gets together: Not on file    Attends religious service: Not on file    Active member of club or organization: Not on file    Attends meetings of clubs or organizations: Not on file    Relationship status: Not on file  . Intimate partner violence:    Fear of current or ex partner: Not on file    Emotionally abused: Not on file    Physically abused: Not on file    Forced sexual activity: Not on file  Other Topics Concern  . Not on file  Social History Narrative   Lives in [place].   Studied sports medicine in college, got a BS - used to work with the [employer].          Current Outpatient Medications on File Prior to Visit  Medication Sig Dispense Refill  . apixaban (ELIQUIS) 5 MG TABS tablet Take 1 tablet (5 mg total) by mouth 2 (two) times daily. 180 tablet 3  . atorvastatin (LIPITOR) 80 MG tablet Take 1 tablet (80 mg total) by mouth daily. 90 tablet 3  . busPIRone (BUSPAR) 10 MG tablet Take 10 mg by mouth 3 (three) times daily.    . carvedilol (COREG) 25 MG tablet Take 0.5 tablets (12.5 mg total) by mouth 2 (two) times daily. 180 tablet 3  . digoxin (LANOXIN) 0.125 MG tablet Take 1 tablet (0.125 mg total) by mouth daily. 90 tablet 3  . fenofibrate (TRICOR) 145 MG tablet Take 145 mg by mouth daily.    Marland Kitchen FLUoxetine (PROZAC) 20 MG capsule Take 80 mg by mouth daily.     . furosemide (LASIX) 20 MG tablet Take 3 tablets (60 mg total) by mouth daily. 270 tablet 3  . hydrOXYzine (ATARAX/VISTARIL) 50 MG tablet Take 50 mg by mouth 3 (three) times daily.    . Insulin Glargine (LANTUS) 100 UNIT/ML Solostar Pen Inject 60 Units into the skin daily.     . insulin lispro (HUMALOG KWIKPEN) 100 UNIT/ML KwikPen Inject 20 Units into the skin 3 (three) times daily.    . Insulin Pen Needle 31G X 5 MM MISC BD Pen Needles- brand specific Inject insulin via insulin pen 4x daily 200 each 3  . lisinopril (PRINIVIL,ZESTRIL) 20 MG tablet Take 1 tablet (20 mg total) by mouth daily. 90 tablet 3  .  methocarbamol (ROBAXIN) 500 MG tablet Take 1 tablet (500 mg total) by mouth every 8 (eight) hours as needed for muscle spasms. 30 tablet 0  . nitroGLYCERIN (NITROSTAT) 0.4 MG SL tablet Place 0.4 mg under  the tongue every 5 (five) minutes as needed for chest pain.    Marland Kitchen omeprazole (PRILOSEC) 40 MG capsule Take 1 capsule (40 mg total) by mouth daily. (Patient taking differently: Take 80 mg by mouth daily. ) 30 capsule 3  . potassium chloride SA (K-DUR,KLOR-CON) 20 MEQ tablet Take 1 tablet (20 mEq total) by mouth daily. 90 tablet 3  . sildenafil (VIAGRA) 25 MG tablet Take 2-3 tablets (50-75 mg total) by mouth daily as needed for erectile dysfunction. 40 tablet 3  . traMADol (ULTRAM) 50 MG tablet Take 1 tablet (50 mg total) by mouth every 6 (six) hours as needed for severe pain. 10 tablet 0   No current facility-administered medications on file prior to visit.     Allergies  Allergen Reactions  . Orange Fruit Anaphylaxis  . Penicillins Anaphylaxis  . Jardiance [Empagliflozin] Itching    Family History  Problem Relation Age of Onset  . Diabetes Mother   . Coronary artery disease Mother 89  . Gout Mother   . Heart disease Mother   . Heart attack Mother 53  . Diabetes Father   . Coronary artery disease Father 75       Died in a house fire   . Heart disease Father   . Heart disease Brother     BP (!) 148/92 (BP Location: Right Arm, Patient Position: Sitting, Cuff Size: Normal)   Pulse 92   Ht 6\' 3"  (1.905 m)   Wt 257 lb 12.8 oz (116.9 kg)   SpO2 98%   BMI 32.22 kg/m     Review of Systems denies weight loss, blurry vision, headache, chest pain, sob, n/v, muscle cramps, excessive diaphoresis, hypoglycemia, memory loss, cold intolerance, rhinorrhea, and easy bruising.  He has urinary frequency, due to lasix.  Depression is well-controlled.      Objective:   Physical Exam VS: see vs page GEN: no distress HEAD: head: no deformity eyes: no periorbital swelling, no  proptosis external nose and ears are normal mouth: no lesion seen NECK: supple, thyroid is not enlarged CHEST WALL: no deformity LUNGS: clear to auscultation CV: reg rate and rhythm, no murmur ABD: abdomen is soft, nontender.  no hepatosplenomegaly.  not distended.  no hernia MUSCULOSKELETAL: muscle bulk and strength are grossly normal.  no obvious joint swelling.  gait is normal and steady EXTEMITIES: no deformity.  no ulcer on the feet.  feet are of normal color and temp.  1+ bilat leg edema PULSES: dorsalis pedis intact bilat.  no carotid bruit NEURO:  cn 2-12 grossly intact.   readily moves all 4's.  sensation is intact to touch on the feet SKIN:  Normal texture and temperature.  No rash or suspicious lesion is visible.   NODES:  None palpable at the neck PSYCH: alert, well-oriented.  Does not appear anxious nor depressed.    Lab Results  Component Value Date   CREATININE 1.46 (H) 08/08/2017   BUN 32 (H) 08/08/2017   NA 134 (L) 08/08/2017   K 3.7 08/08/2017   CL 100 08/08/2017   CO2 25 08/08/2017   Lab Results  Component Value Date   HGBA1C 10.6 (A) 12/10/2017   I have reviewed outside records, and summarized: Pt was noted to have elevated a1c, and referred here.  He has just regained insurance, and was seen to establish care at [facility]  I personally reviewed electrocardiogram tracing (10/21/17): Indication: CHF Impression: NSR.  No MI.  No hypertrophy.  NS-T and TWA.  Long QT  Compared to 2105: TWA are less prominent.      Assessment & Plan:  HTN: is note today Insulin-requiring type 2 DM, with renal insuff: severe exacerbation.   Patient Instructions  Your blood pressure is high today.  Please see your primary care provider soon, to have it rechecked. good diet and exercise significantly improve the control of your diabetes.  please let me know if you wish to be referred to a dietician.  high blood sugar is very risky to your health.  you should see an eye doctor and  dentist every year.  It is very important to get all recommended vaccinations.  check your blood sugar 4 times a day: before the 3 meals, and at bedtime.  also check if you have symptoms of your blood sugar being too high or too low.  please keep a record of the readings and bring it to your next appointment here (or you can bring the meter itself).  You can write it on any piece of paper.  please call us sooner if your blood sugar goes below 70, or if you have a lot of readings over 200. Please continue the same insulins for now. We'll try to get the prior authorization, for the Ozempic. Also, please see 1 of our educators, for the [place] and Dexcom.  Please come back for a follow-up appointment in 1 month.

## 2017-12-11 ENCOUNTER — Telehealth: Payer: Self-pay

## 2017-12-11 NOTE — Telephone Encounter (Signed)
Received notification from [facility] that PA for Ozempic 2/1.49mL has been approved 12/10/17 through 12/11/18. Request authorized and refill sent as requested.

## 2017-12-15 ENCOUNTER — Telehealth: Payer: Self-pay | Admitting: Endocrinology

## 2017-12-15 NOTE — Telephone Encounter (Signed)
[facility] called re: PA for Ozempic. Ph# [phone]

## 2017-12-15 NOTE — Telephone Encounter (Signed)
Returned call. Was informed that Ozempic has now been denied. Advised of approval received 12/11/17 stating medication was approved 12/10/17 through 12/17/17. Advised denial be faxed to [phone]. Will compare approval and denial to determine why the sudden change in PA within less than 48 hour time span.

## 2017-12-16 ENCOUNTER — Telehealth: Payer: Self-pay | Admitting: Endocrinology

## 2017-12-16 MED ORDER — DULAGLUTIDE 0.75 MG/0.5ML ~~LOC~~ SOAJ: 0.7500 mg | SUBCUTANEOUS | 11 refills | Status: DC

## 2017-12-16 NOTE — Telephone Encounter (Signed)
OK, I sent rx °

## 2017-12-16 NOTE — Telephone Encounter (Signed)
trulicity approved PA # [id number] through 12/17/18

## 2017-12-16 NOTE — Telephone Encounter (Signed)
Pa for trulicity has been started on covermy meds

## 2017-12-16 NOTE — Telephone Encounter (Signed)
Sure, can you please order it so that I can have dosing and usage instructions for the PA?

## 2017-12-16 NOTE — Telephone Encounter (Signed)
I got denial letter.  Can we do PA for Trulicity?

## 2017-12-18 ENCOUNTER — Telehealth: Payer: Self-pay | Admitting: Endocrinology

## 2017-12-18 NOTE — Telephone Encounter (Signed)
Per [facility] "Caller states he needs office to call him regarding amedication called in."

## 2017-12-19 NOTE — Telephone Encounter (Signed)
Spoke to patient and he has called IT consultant and he needs PA for [employer]- he states Invokana is preferred but did not tell him what was preferred for Ozempic-patient states he can not take Jardiance because it makes him itch- please follow up to see if PA requests for this have come in from [facility] so that we can get this patient taken care

## 2017-12-19 NOTE — Telephone Encounter (Signed)
Please check documentation in pt chart

## 2017-12-19 NOTE — Telephone Encounter (Signed)
Charisse March can you please see where we are with PA for [place] and Ozempic

## 2017-12-23 DIAGNOSIS — J0100 Acute maxillary sinusitis, unspecified: Secondary | ICD-10-CM | POA: Diagnosis not present

## 2017-12-23 DIAGNOSIS — Z6834 Body mass index (BMI) 34.0-34.9, adult: Secondary | ICD-10-CM | POA: Diagnosis not present

## 2018-01-06 ENCOUNTER — Ambulatory Visit: Payer: BLUE CROSS/BLUE SHIELD | Admitting: Registered"

## 2018-01-07 ENCOUNTER — Other Ambulatory Visit: Payer: Self-pay

## 2018-01-07 ENCOUNTER — Encounter: Payer: BLUE CROSS/BLUE SHIELD | Attending: Endocrinology | Admitting: Nutrition

## 2018-01-07 DIAGNOSIS — E108 Type 1 diabetes mellitus with unspecified complications: Secondary | ICD-10-CM | POA: Insufficient documentation

## 2018-01-07 MED ORDER — DEXCOM G6 SENSOR MISC: 1.0000 | 3 refills | Status: DC

## 2018-01-07 MED ORDER — DEXCOM G6 TRANSMITTER MISC: 1.0000 | 3 refills | Status: DC

## 2018-01-07 MED ORDER — DULAGLUTIDE 0.75 MG/0.5ML ~~LOC~~ SOAJ: 0.7500 mg | SUBCUTANEOUS | 11 refills | Status: DC

## 2018-01-09 ENCOUNTER — Telehealth: Payer: Self-pay | Admitting: Endocrinology

## 2018-01-09 ENCOUNTER — Telehealth: Payer: Self-pay

## 2018-01-09 NOTE — Telephone Encounter (Signed)
please call patient: We received request for Onmipod and continuous glucose monitor.  Please see Vaughan Basta or Mickel Baas for this.  Do you want to try to get an appt at the same time you are here?

## 2018-01-09 NOTE — Telephone Encounter (Signed)
PA initiated today through Cover My Meds for both Dexcom G6 sensor and transmitter. Will await insurance response re: approval/denial.

## 2018-01-12 NOTE — Progress Notes (Signed)
Patient is here to order the OmniPod pump.  He says he has looked at all of the options, and wants this one.  He was show the pod and we discussed how it works and what he has to do to be on one. He agrees to test his blood sugar and count carbs.  Paperwork was filled out and faxed in.  He was told to call me when he get this, to schedule for training.

## 2018-01-12 NOTE — Patient Instructions (Signed)
Call to schedule a pump training when the pump comes to you.

## 2018-01-13 ENCOUNTER — Ambulatory Visit: Payer: BLUE CROSS/BLUE SHIELD | Admitting: Endocrinology

## 2018-01-13 NOTE — Telephone Encounter (Signed)
Appointment scheduled for 01/27/18 for pump start

## 2018-01-13 NOTE — Telephone Encounter (Signed)
Please reach out to pt at Dr. Cordelia Pen request to schedule appt at his earliest convenience.

## 2018-01-14 ENCOUNTER — Telehealth: Payer: Self-pay | Admitting: Endocrinology

## 2018-01-14 NOTE — Telephone Encounter (Signed)
Spoke to omnipod they just wanted to verify that we did receive paperwork informed her that it was on physician desk awaiting signature

## 2018-01-14 NOTE — Telephone Encounter (Signed)
[employer] Calling Re: PA for this patient  Call back is [phone] ext [phone] ask for [facility]

## 2018-01-19 ENCOUNTER — Telehealth: Payer: Self-pay | Admitting: Endocrinology

## 2018-01-19 NOTE — Telephone Encounter (Signed)
Omn[employer] Calling Re: PA for this patient.  Representative for Omn[employer]od would like for the completed paperwork to be sent back to them as priority  Call back is [phone]

## 2018-01-22 NOTE — Telephone Encounter (Signed)
Spoke to [employer] and informed her that paperwork may still be on physician desk, and that I would fax it as soon as it was signed

## 2018-01-23 ENCOUNTER — Encounter: Payer: Self-pay | Admitting: Cardiology

## 2018-01-26 NOTE — Telephone Encounter (Signed)
done

## 2018-01-27 ENCOUNTER — Telehealth: Payer: Self-pay | Admitting: Nutrition

## 2018-01-27 ENCOUNTER — Encounter: Payer: BLUE CROSS/BLUE SHIELD | Admitting: Nutrition

## 2018-01-27 DIAGNOSIS — R05 Cough: Secondary | ICD-10-CM | POA: Diagnosis not present

## 2018-01-27 DIAGNOSIS — J019 Acute sinusitis, unspecified: Secondary | ICD-10-CM | POA: Diagnosis not present

## 2018-01-27 NOTE — Telephone Encounter (Signed)
Patient was tried to called X2.  No notification from Om[employer] that pump was shipped to him, and no paperwork is available to get pump start orders from Dr. Loanne Drilling.  Call to patient was tried X2 to notify him that appointment needs to be canceled, but phone call was not able to go through X2.

## 2018-01-29 ENCOUNTER — Telehealth: Payer: Self-pay | Admitting: Nutrition

## 2018-01-29 NOTE — Telephone Encounter (Signed)
Patient called to update his number. And would like you to call him,. He was unable to call our office due to phone issues.

## 2018-01-29 NOTE — Telephone Encounter (Signed)
Received notification from [facility] requesting CMN and office notes. Most recent office note printed and attached to CMN request. Placed on Dr. Cordelia Pen desk for completion. Will fax once completed.

## 2018-02-02 NOTE — Telephone Encounter (Signed)
Message left on machine to call me to reschedule pump start

## 2018-02-03 ENCOUNTER — Ambulatory Visit: Payer: BLUE CROSS/BLUE SHIELD | Admitting: Endocrinology

## 2018-02-03 ENCOUNTER — Encounter: Payer: Self-pay | Admitting: Endocrinology

## 2018-02-03 VITALS — BP 152/108 | HR 108 | Ht 75.0 in | Wt 277.4 lb

## 2018-02-03 DIAGNOSIS — E1165 Type 2 diabetes mellitus with hyperglycemia: Secondary | ICD-10-CM | POA: Diagnosis not present

## 2018-02-03 LAB — POCT GLYCOSYLATED HEMOGLOBIN (HGB A1C): Hemoglobin A1C: 10.4 % — AB (ref 4.0–5.6)

## 2018-02-03 MED ORDER — INSULIN LISPRO (1 UNIT DIAL) 100 UNIT/ML (KWIKPEN): 30.0000 [IU] | PEN_INJECTOR | Freq: Three times a day (TID) | SUBCUTANEOUS | 11 refills | Status: DC

## 2018-02-03 MED ORDER — INSULIN GLARGINE 100 UNIT/ML SOLOSTAR PEN: 50.0000 [IU] | PEN_INJECTOR | Freq: Every day | SUBCUTANEOUS | 11 refills | Status: DC

## 2018-02-03 NOTE — Telephone Encounter (Signed)
Patient called back saying he had no heard anything about his pump and wanted to call to check the status of this.  Bret's number was given to him

## 2018-02-03 NOTE — Progress Notes (Signed)
Subjective:    Patient ID: Corey Palmer, male    DOB: November 25, 1970, 48 y.o.   MRN: [id number]  HPI Pt returns for f/u of diabetes mellitus: DM type: Insulin-requiring type 2 Dx'ed: 2458 Complications: polyneuropathy, CAD, and renal insuff.  Therapy: insulin since 2011 DKA: never Severe hypoglycemia: once (2015) Pancreatitis: never Pancreatic imaging: normal on 2013 CT Other: he stopped Ozempic, as it needs PAl; ins declines farxiga.  Interval history:  no cbg record, but states cbg's vary from 48-200's.  It is in general higher as the day goes on.  pt states he feels well in general. Past Medical History:  Diagnosis Date  . Anginal pain ([place])   . Anxiety   . Arthritis   . Automatic implantable cardioverter-defibrillator in situ    Medtronic   . Bipolar disorder ([place])   . CAD (coronary artery disease)    Cardiac cath (08/19/2000) - Nonobstructive, 40% stenosis of LAD,.   . Cancer [facility]) 2013   benign stomach cancer  . CHF (congestive heart failure) ([place])   . Chondrocalcinosis of right knee 06/11/2013  . Chronic systolic heart failure ([place])    2D echo (09/9831) - Systolic function severely reduced., LV EF 20-25%. Akinesis of apical and anteroseptal mycoardium.  last 2D echo (11/2008 ), LV EF 25-30%, diffuse hypokinesis, and grade 1 diastolic dysfunction.  . Depression   . Diabetes uncomplicated adult-type II    on insulin therapy, a1c 12.9 in 01/2011  . Dilated cardiomyopathy ([place])    status post ICD placement in 2008 c/b tearing at the atrial junction resulting in tamponade and urgent thoracotomy  . Dyslipidemia   . Dysrhythmia   . GERD (gastroesophageal reflux disease)   . Gout, unspecified   . Hepatic steatosis 2011   seen on ultrasound.   . History of kidney stones   . Hx of echocardiogram    Echo (04/2013):  Mild LVH, EF 20-25%, mild LAE, mild to mod RVE with mild reduced RVSF.  Marland Kitchen Hypertension   . ICD (implantable cardiac defibrillator) in place 2007  . Myocardial  infarction ([place]) 2008  . Obesity (BMI 30.0-34.9)   . Pacemaker   . Paroxysmal atrial fibrillation (HCC)    on chronic coumadin, goal INR 2-3. status post direct current  . Peptic ulcer   . Shortness of breath   . Sleep apnea    does not have CPAP    Past Surgical History:  Procedure Laterality Date  . CARDIAC CATHETERIZATION    . CARDIAC PACEMAKER PLACEMENT    . CHOLECYSTECTOMY    . COLONOSCOPY    . CORONARY ARTERY BYPASS GRAFT    . ESOPHAGOGASTRODUODENOSCOPY  04/05/2011   Procedure: ESOPHAGOGASTRODUODENOSCOPY (EGD);  Surgeon: Irene Shipper, MD;  Location: [facility] ENDOSCOPY;  Service: Endoscopy;  Laterality: N/A;  . JOINT REPLACEMENT     left knee replacement  . KNEE ARTHROSCOPY Right 06/11/2013   Procedure: RIGHT KNEE ARTHROSCOPY with chondroplasty;  Surgeon: Johnny Bridge, MD;  Location: [place];  Service: Orthopedics;  Laterality: Right;  . TOTAL KNEE ARTHROPLASTY Left     Social History   Socioeconomic History  . Marital status: Married    Spouse name: Not on file  . Number of children: 3  . Years of education: college  . Highest education level: Not on file  Occupational History  . Occupation: bar tender  Social Needs  . Financial resource strain: Not on file  . Food insecurity:    Worry: Not on file  Inability: Not on file  . Transportation needs:    Medical: Not on file    Non-medical: Not on file  Tobacco Use  . Smoking status: Never Smoker  . Smokeless tobacco: Never Used  Substance and Sexual Activity  . Alcohol use: No  . Drug use: No  . Sexual activity: Not on file  Lifestyle  . Physical activity:    Days per week: Not on file    Minutes per session: Not on file  . Stress: Not on file  Relationships  . Social connections:    Talks on phone: Not on file    Gets together: Not on file    Attends religious service: Not on file    Active member of club or organization: Not on file    Attends meetings of clubs or organizations: Not on file     Relationship status: Not on file  . Intimate partner violence:    Fear of current or ex partner: Not on file    Emotionally abused: Not on file    Physically abused: Not on file    Forced sexual activity: Not on file  Other Topics Concern  . Not on file  Social History Narrative   Lives in [place].   Studied sports medicine in college, got a BS - used to work with the [employer].          Current Outpatient Medications on File Prior to Visit  Medication Sig Dispense Refill  . apixaban (ELIQUIS) 5 MG TABS tablet Take 1 tablet (5 mg total) by mouth 2 (two) times daily. 180 tablet 3  . atorvastatin (LIPITOR) 80 MG tablet Take 1 tablet (80 mg total) by mouth daily. 90 tablet 3  . busPIRone (BUSPAR) 10 MG tablet Take 10 mg by mouth 3 (three) times daily.    . carvedilol (COREG) 25 MG tablet Take 0.5 tablets (12.5 mg total) by mouth 2 (two) times daily. 180 tablet 3  . digoxin (LANOXIN) 0.125 MG tablet Take 1 tablet (0.125 mg total) by mouth daily. 90 tablet 3  . Dulaglutide (TRULICITY) 7.98 XQ/1.1HE SOPN Inject 0.75 mg into the skin once a week. 4 pen 11  . fenofibrate (TRICOR) 145 MG tablet Take 145 mg by mouth daily.    Marland Kitchen FLUoxetine (PROZAC) 20 MG capsule Take 80 mg by mouth daily.     . furosemide (LASIX) 20 MG tablet Take 3 tablets (60 mg total) by mouth daily. 270 tablet 3  . hydrOXYzine (ATARAX/VISTARIL) 50 MG tablet Take 50 mg by mouth 3 (three) times daily.    . Insulin Pen Needle 31G X 5 MM MISC BD Pen Needles- brand specific Inject insulin via insulin pen 4x daily 200 each 3  . lisinopril (PRINIVIL,ZESTRIL) 20 MG tablet Take 1 tablet (20 mg total) by mouth daily. 90 tablet 3  . omeprazole (PRILOSEC) 40 MG capsule Take 1 capsule (40 mg total) by mouth daily. (Patient taking differently: Take 80 mg by mouth daily. ) 30 capsule 3  . potassium chloride SA (K-DUR,KLOR-CON) 20 MEQ tablet Take 1 tablet (20 mEq total) by mouth daily. 90 tablet 3  . sildenafil (VIAGRA) 25 MG tablet Take  2-3 tablets (50-75 mg total) by mouth daily as needed for erectile dysfunction. 40 tablet 3  . Continuous Blood Gluc Sensor (DEXCOM G6 SENSOR) MISC 1 Package by Does not apply route as directed. every 10 days (Patient not taking: Reported on 02/03/2018) 3 each 3  . Continuous Blood Gluc Transmit (DEXCOM G6 TRANSMITTER)  MISC 1 Package by Does not apply route every 3 (three) months. (Patient not taking: Reported on 02/03/2018) 3 each 3  . dapagliflozin propanediol (FARXIGA) 5 MG TABS tablet Take 5 mg by mouth daily. (Patient not taking: Reported on 02/03/2018) 90 tablet 3  . methocarbamol (ROBAXIN) 500 MG tablet Take 1 tablet (500 mg total) by mouth every 8 (eight) hours as needed for muscle spasms. (Patient not taking: Reported on 02/03/2018) 30 tablet 0  . nitroGLYCERIN (NITROSTAT) 0.4 MG SL tablet Place 0.4 mg under the tongue every 5 (five) minutes as needed for chest pain.    . traMADol (ULTRAM) 50 MG tablet Take 1 tablet (50 mg total) by mouth every 6 (six) hours as needed for severe pain. (Patient not taking: Reported on 02/03/2018) 10 tablet 0   No current facility-administered medications on file prior to visit.     Allergies  Allergen Reactions  . Orange Fruit Anaphylaxis  . Penicillins Anaphylaxis  . Jardiance [Empagliflozin] Itching    Family History  Problem Relation Age of Onset  . Diabetes Mother   . Coronary artery disease Mother 59  . Gout Mother   . Heart disease Mother   . Heart attack Mother 27  . Diabetes Father   . Coronary artery disease Father 48       Died in a house fire   . Heart disease Father   . Heart disease Brother     BP (!) 152/108 (BP Location: Right Arm, Patient Position: Sitting, Cuff Size: Normal)   Pulse (!) 108   Ht 6\' 3"  (1.905 m)   Wt 277 lb 6.4 oz (125.8 kg)   SpO2 98%   BMI 34.67 kg/m   Review of Systems He denies LOC    Objective:   Physical Exam VITAL SIGNS:  See vs page GENERAL: no distress Pulses: dorsalis pedis intact bilat.   MSK:  no deformity of the feet CV: no leg edema Skin:  no ulcer on the feet.  normal color and temp on the feet. Neuro: sensation is intact to touch on the feet   Lab Results  Component Value Date   HGBA1C 10.4 (A) 02/03/2018       Assessment & Plan:  HTN: is noted today.  Insulin-requiring type 2 DM: The pattern of his cbg's indicates he needs some adjustment in his therapy Renal insuff: this is the likely reason why he needs more humalog, and less lantus.  This will also be considered with his ump settings, when he starts pump.   Patient Instructions  Your blood pressure is high today.  Please see your primary care provider soon, to have it rechecked. check your blood sugar 4 times a day: before the 3 meals, and at bedtime.  also check if you have symptoms of your blood sugar being too high or too low.  please keep a record of the readings and bring it to your next appointment here (or you can bring the meter itself).  You can write it on any piece of paper.  please call us sooner if your blood sugar goes below 70, or if you have a lot of readings over 200. Please increase the humalog to 30 units 3 times a day (just before each meal), and reduce the lantus to 50 units per day When you start the Omnipod, take these settings:  basal rate of 2 units/hr.  bolus of 1 unit/4 grams carbohydrate. continue correction bolus (which some people call "sensitivity," or "insulin sensitivity ratio,"  or just "isr") of 1 unit for each 20 by which your glucose exceeds 100 Please come back for a follow-up appointment a few days after starting the Omnipod.

## 2018-02-03 NOTE — Patient Instructions (Addendum)
Your blood pressure is high today.  Please see your primary care provider soon, to have it rechecked. check your blood sugar 4 times a day: before the 3 meals, and at bedtime.  also check if you have symptoms of your blood sugar being too high or too low.  please keep a record of the readings and bring it to your next appointment here (or you can bring the meter itself).  You can write it on any piece of paper.  please call us sooner if your blood sugar goes below 70, or if you have a lot of readings over 200. Please increase the humalog to 30 units 3 times a day (just before each meal), and reduce the lantus to 50 units per day When you start the Omnipod, take these settings:  basal rate of 2 units/hr.  bolus of 1 unit/4 grams carbohydrate. continue correction bolus (which some people call "sensitivity," or "insulin sensitivity ratio," or just "isr") of 1 unit for each 20 by which your glucose exceeds 100 Please come back for a follow-up appointment a few days after starting the Omnipod.

## 2018-02-04 ENCOUNTER — Telehealth: Payer: Self-pay

## 2018-02-04 DIAGNOSIS — E1165 Type 2 diabetes mellitus with hyperglycemia: Secondary | ICD-10-CM | POA: Diagnosis not present

## 2018-02-04 NOTE — Telephone Encounter (Signed)
Received referral status from [facility] cancelled due to no compliant for dexcom G6

## 2018-02-16 ENCOUNTER — Encounter: Payer: BLUE CROSS/BLUE SHIELD | Attending: Endocrinology | Admitting: Nutrition

## 2018-02-16 ENCOUNTER — Other Ambulatory Visit: Payer: Self-pay

## 2018-02-16 DIAGNOSIS — E1165 Type 2 diabetes mellitus with hyperglycemia: Secondary | ICD-10-CM

## 2018-02-16 DIAGNOSIS — E108 Type 1 diabetes mellitus with unspecified complications: Secondary | ICD-10-CM | POA: Diagnosis not present

## 2018-02-16 MED ORDER — INSULIN LISPRO 100 UNIT/ML ~~LOC~~ SOLN: 60.0000 [IU] | SUBCUTANEOUS | 11 refills | Status: DC

## 2018-02-16 MED ORDER — GLUCOSE BLOOD VI STRP: ORAL_STRIP | 3 refills | Status: DC

## 2018-02-16 MED ORDER — FREESTYLE LANCETS MISC: 3 refills | Status: DC

## 2018-02-16 MED ORDER — CHLORHEXIDINE GLUCONATE (DRESSING) EX MISC: 1.0000 | Freq: Every day | CUTANEOUS | 12 refills | Status: DC

## 2018-02-17 ENCOUNTER — Telehealth: Payer: Self-pay

## 2018-02-17 ENCOUNTER — Telehealth: Payer: Self-pay | Admitting: Nutrition

## 2018-02-17 NOTE — Telephone Encounter (Signed)
Patient reported no difficulty giving bolus for lunch and supper.  Says blood sugar was 203 acS (6PM), and was now 167 2 hours after supper, with 4u of IOB.  He was reminded that his IOB may drop his blood sugar too low, and to retest in 1-2 hours. He reported good understanding of this. He was asked to call blood sugar readings to me at work in the AM. He agreed to do this and had no questions for me at this time.

## 2018-02-17 NOTE — Patient Instructions (Signed)
Read over manual and resource manual tonight Call 800 number if questions.

## 2018-02-17 NOTE — Progress Notes (Signed)
Corey Palmer was trained on how to use the Omnipod insulin pump.  Settings were put in per Dr. Cordelia Pen orders:  Basal rate: 2.0u/hr, I/C ratio: 4, ISF: 20, timing 4 hours target: 100 with correction over 100.     He filled a pod with Humalog insulin and attached it to his left abdomen without any difficulty  We reviewed how to give a bolus, and using the temp basal rate--when and how to do this, and high blood sugar protocol.  He had no questions.  We reviewed the checklist and he signed off as understanding all topics with no final questions.   He was told to test ac and HS and to call the office if readings drop low, or stay above 200.  He agreed to do this.   Settings were put into the pump by the patient per Dr. Cordelia Pen note.  Basal rate: 2.0, I/C ratio: 10, ISF: 40, Target: 100 with correction over 100, timing: 4 hours.    He reports that he can count carbs accurately, and did not need a review

## 2018-02-17 NOTE — Telephone Encounter (Signed)
PA initiated today through Cover My Meds for [employer] strips. Will await insurance response re: approval/denial.  Juliette Mangle Key: [id number] - PA Case ID: [id number] - Rx #: [id number] Need help? Call us at [phone]  Status: Sent to Plan today  Drug: FreeStyle Test strips  Form: [employer] Electronic Prior Authorization Form (2017 NCPDP)  Original Claim Info70

## 2018-02-19 DIAGNOSIS — Z0000 Encounter for general adult medical examination without abnormal findings: Secondary | ICD-10-CM | POA: Diagnosis not present

## 2018-02-20 NOTE — Telephone Encounter (Signed)
Letter and below message sent to Dr. Loanne Drilling for review:  02/19/2018  [facility] [address] [place], [place] [address]  Plan member ID: [id number] [place] Case number: [id number] Prescriber name: Renato Shin Prescriber fax: [phone]  [place]  Dear Juliette Mangle,  On behalf of [place], [employer] is responsible for reviewing pharmacy services provided to [place] members. On 02/17/2018, we received a request from your prescriber for coverage of FREESTYLE TES. We reviewed all of the information you and/or your doctor sent to [place] and sent the information to an appropriate physician specialist if needed. Unfortunately, we must deny coverage for FREESTYLE TEST STRIPS.  Why was my request denied?  This request was denied because you did not meet the following clinical requirements:  The requested medication is not covered because it is not on the listing or formulary of approved drugs for your plan benefit. Please discuss alternative drug therapy with your doctor. Per your health plan's criteria, this product is covered if you meet the following: One of the following:  (1) You have tried One Touch test strips (for example: One Touch Basic, One Touch Sure Step).  (2) This test strip is required because it is the only product that will work (interface) with your insulin pump.  The information provided does not show that you meet the criteria listed above. Please speak with your doctor about your choices.  Reviewed by: Alicia Amel.D.  This denial is based on our FREESTYLE TEST STRIPS drug coverage policy, in addition to any supplementary information you or your prescriber may have submitted.  How can I obtain the material(s) used to review this request? You may request, free of charge, a copy of the drug coverage policy, actual benefit provision, guideline, protocol or other information that factored into the decision, including the diagnosis code  and the treatment code and their corresponding meanings, by calling us at [phone]

## 2018-02-27 ENCOUNTER — Ambulatory Visit: Payer: BLUE CROSS/BLUE SHIELD | Admitting: Endocrinology

## 2018-02-27 ENCOUNTER — Encounter: Payer: Self-pay | Admitting: Endocrinology

## 2018-02-27 DIAGNOSIS — E1165 Type 2 diabetes mellitus with hyperglycemia: Secondary | ICD-10-CM

## 2018-02-27 MED ORDER — GLUCOSE BLOOD VI STRP: ORAL_STRIP | 3 refills | Status: DC

## 2018-02-27 MED ORDER — OMNIPOD 10 PACK MISC: 1.0000 | 3 refills | Status: DC

## 2018-02-27 MED ORDER — INSULIN LISPRO 100 UNIT/ML ~~LOC~~ SOLN: SUBCUTANEOUS | 3 refills | Status: DC

## 2018-02-27 NOTE — Progress Notes (Signed)
Subjective:    Patient ID: Corey Palmer, male    DOB: [birth year], 48 y.o.   MRN: [id number]  HPI Pt returns for f/u of diabetes mellitus: DM type: Insulin-requiring type 2 Dx'ed: 8786 Complications: polyneuropathy, CAD, and renal insuff.  Therapy: insulin since 2011 (Omnipod since 2020).   DKA: never Severe hypoglycemia: once (2015) Pancreatitis: never Pancreatic imaging: normal on 2013 CT Other: he stopped Ozempic, as it needs PA; ins declines farxiga; he declines continuous glucose monitor, due to cost.   Interval history:   She takes these settings: basal rate of 2 units/hr.  bolus of 1 unit/4 grams carbohydrate. continue correction bolus (which some people call "sensitivity," or "insulin sensitivity ratio," or just "isr") of 1 unit for each 20 by which your glucose exceeds 100 TDD is 65 units (35 of which is basal).   Meter is downloaded today, and the printout is scanned into the record.  cbg varies from 66-258.  It is in general lower in the afternoon, but there is not a big trend throughout the day Past Medical History:  Diagnosis Date  . Anginal pain ([place])   . Anxiety   . Arthritis   . Automatic implantable cardioverter-defibrillator in situ    Medtronic   . Bipolar disorder ([place])   . CAD (coronary artery disease)    Cardiac cath (08/19/2000) - Nonobstructive, 40% stenosis of LAD,.   . Cancer [facility]) 2013   benign stomach cancer  . CHF (congestive heart failure) ([place])   . Chondrocalcinosis of right knee 06/11/2013  . Chronic systolic heart failure ([place])    2D echo (76/7209) - Systolic function severely reduced., LV EF 20-25%. Akinesis of apical and anteroseptal mycoardium.  last 2D echo (11/2008 ), LV EF 25-30%, diffuse hypokinesis, and grade 1 diastolic dysfunction.  . Depression   . Diabetes uncomplicated adult-type II    on insulin therapy, a1c 12.9 in 01/2011  . Dilated cardiomyopathy ([place])    status post ICD placement in 2008 c/b tearing at the atrial junction  resulting in tamponade and urgent thoracotomy  . Dyslipidemia   . Dysrhythmia   . GERD (gastroesophageal reflux disease)   . Gout, unspecified   . Hepatic steatosis 2011   seen on ultrasound.   . History of kidney stones   . Hx of echocardiogram    Echo (04/2013):  Mild LVH, EF 20-25%, mild LAE, mild to mod RVE with mild reduced RVSF.  Marland Kitchen Hypertension   . ICD (implantable cardiac defibrillator) in place 2007  . Myocardial infarction ([place]) 2008  . Obesity (BMI 30.0-34.9)   . Pacemaker   . Paroxysmal atrial fibrillation (HCC)    on chronic coumadin, goal INR 2-3. status post direct current  . Peptic ulcer   . Shortness of breath   . Sleep apnea    does not have CPAP    Past Surgical History:  Procedure Laterality Date  . CARDIAC CATHETERIZATION    . CARDIAC PACEMAKER PLACEMENT    . CHOLECYSTECTOMY    . COLONOSCOPY    . CORONARY ARTERY BYPASS GRAFT    . ESOPHAGOGASTRODUODENOSCOPY  04/05/2011   Procedure: ESOPHAGOGASTRODUODENOSCOPY (EGD);  Surgeon: Irene Shipper, MD;  [facility] ENDOSCOPY;  Service: Endoscopy;  Laterality: N/A;  . JOINT REPLACEMENT     left knee replacement  . KNEE ARTHROSCOPY Right 06/11/2013   Procedure: RIGHT KNEE ARTHROSCOPY with chondroplasty;  Surgeon: Johnny Bridge, MD;  Location: [place];  Service: Orthopedics;  Laterality: Right;  . TOTAL KNEE  ARTHROPLASTY Left     Social History   Socioeconomic History  . Marital status: Married    Spouse name: Not on file  . Number of children: 3  . Years of education: college  . Highest education level: Not on file  Occupational History  . Occupation: bar tender  Social Needs  . Financial resource strain: Not on file  . Food insecurity:    Worry: Not on file    Inability: Not on file  . Transportation needs:    Medical: Not on file    Non-medical: Not on file  Tobacco Use  . Smoking status: Never Smoker  . Smokeless tobacco: Never Used  Substance and Sexual Activity  . Alcohol use: No  . Drug use: No    . Sexual activity: Not on file  Lifestyle  . Physical activity:    Days per week: Not on file    Minutes per session: Not on file  . Stress: Not on file  Relationships  . Social connections:    Talks on phone: Not on file    Gets together: Not on file    Attends religious service: Not on file    Active member of club or organization: Not on file    Attends meetings of clubs or organizations: Not on file    Relationship status: Not on file  . Intimate partner violence:    Fear of current or ex partner: Not on file    Emotionally abused: Not on file    Physically abused: Not on file    Forced sexual activity: Not on file  Other Topics Concern  . Not on file  Social History Narrative   Lives in [place].   Studied sports medicine in college, got a BS - used to work with the [employer].          Current Outpatient Medications on File Prior to Visit  Medication Sig Dispense Refill  . apixaban (ELIQUIS) 5 MG TABS tablet Take 1 tablet (5 mg total) by mouth 2 (two) times daily. 180 tablet 3  . atorvastatin (LIPITOR) 80 MG tablet Take 1 tablet (80 mg total) by mouth daily. 90 tablet 3  . busPIRone (BUSPAR) 10 MG tablet Take 10 mg by mouth 3 (three) times daily.    . carvedilol (COREG) 25 MG tablet Take 0.5 tablets (12.5 mg total) by mouth 2 (two) times daily. 180 tablet 3  . Chlorhexidine Gluconate (TEGADERM CHG DRESSING) (Dressing) MISC Apply 1 each topically daily. Secure pod with tegaderm daily; E10.8 1 each 12  . digoxin (LANOXIN) 0.125 MG tablet Take 1 tablet (0.125 mg total) by mouth daily. 90 tablet 3  . Dulaglutide (TRULICITY) 6.56 CL/2.7NT SOPN Inject 0.75 mg into the skin once a week. 4 pen 11  . fenofibrate (TRICOR) 145 MG tablet Take 145 mg by mouth daily.    Marland Kitchen FLUoxetine (PROZAC) 20 MG capsule Take 80 mg by mouth daily.     . furosemide (LASIX) 20 MG tablet Take 3 tablets (60 mg total) by mouth daily. 270 tablet 3  . hydrOXYzine (ATARAX/VISTARIL) 50 MG tablet Take 50 mg  by mouth 3 (three) times daily.    Marland Kitchen lisinopril (PRINIVIL,ZESTRIL) 20 MG tablet Take 1 tablet (20 mg total) by mouth daily. 90 tablet 3  . nitroGLYCERIN (NITROSTAT) 0.4 MG SL tablet Place 0.4 mg under the tongue every 5 (five) minutes as needed for chest pain.    Marland Kitchen omeprazole (PRILOSEC) 40 MG capsule Take 1 capsule (40 mg  total) by mouth daily. (Patient taking differently: Take 80 mg by mouth daily. ) 30 capsule 3  . potassium chloride SA (K-DUR,KLOR-CON) 20 MEQ tablet Take 1 tablet (20 mEq total) by mouth daily. 90 tablet 3  . sildenafil (VIAGRA) 25 MG tablet Take 2-3 tablets (50-75 mg total) by mouth daily as needed for erectile dysfunction. 40 tablet 3  . Continuous Blood Gluc Sensor (DEXCOM G6 SENSOR) MISC 1 Package by Does not apply route as directed. every 10 days (Patient not taking: Reported on 02/03/2018) 3 each 3  . Continuous Blood Gluc Transmit (DEXCOM G6 TRANSMITTER) MISC 1 Package by Does not apply route every 3 (three) months. (Patient not taking: Reported on 02/03/2018) 3 each 3   No current facility-administered medications on file prior to visit.     Allergies  Allergen Reactions  . Orange Fruit Anaphylaxis  . Penicillins Anaphylaxis  . Jardiance [Empagliflozin] Itching    Family History  Problem Relation Age of Onset  . Diabetes Mother   . Coronary artery disease Mother 80  . Gout Mother   . Heart disease Mother   . Heart attack Mother 31  . Diabetes Father   . Coronary artery disease Father 18       Died in a house fire   . Heart disease Father   . Heart disease Brother     BP 122/88 (BP Location: Right Arm, Patient Position: Sitting, Cuff Size: Normal)   Pulse 100   Ht 6' (1.829 m)   Wt 268 lb 9.6 oz (121.8 kg)   SpO2 97%   BMI 36.43 kg/m   Review of Systems Denies LOC    Objective:   Physical Exam VITAL SIGNS:  See vs page GENERAL: no distress Pulses: dorsalis pedis intact bilat.   MSK: no deformity of the feet CV: no leg edema Skin:  no ulcer on  the feet.  normal color and temp on the feet. Neuro: sensation is intact to touch on the feet   Lab Results  Component Value Date   CREATININE 1.46 (H) 08/08/2017   BUN 32 (H) 08/08/2017   NA 134 (L) 08/08/2017   K 3.7 08/08/2017   CL 100 08/08/2017   CO2 25 08/08/2017      Assessment & Plan:  Insulin-requiring type 2 DM, with CAD: The pattern of his cbg's indicates he needs some adjustment in his therapy Renal insuff: in this setting, he needs mostly mealtime insulin. Hypoglycemia: this is limiting aggressiveness of glycemic control.  Patient Instructions  check your blood sugar 10 times a day: before the 3 meals, and at bedtime.  also check if you have symptoms of your blood sugar being too high or too low.  please keep a record of the readings and bring it to your next appointment here (or you can bring the meter itself).  You can write it on any piece of paper.  please call us sooner if your blood sugar goes below 70, or if you have a lot of readings over 200. Please continue these settings:  basal rate of 2 units/hr (suspend 1-2 hrs for heavy activity). bolus of 1 unit/4 grams carbohydrate. continue correction bolus (which some people call "sensitivity," or "insulin sensitivity ratio," or just "isr") of 1 unit for each 20 by which your glucose exceeds 100.   Please continue the same trulicity for now.   Please come back for a follow-up appointment in 1 month.

## 2018-02-27 NOTE — Patient Instructions (Addendum)
check your blood sugar 10 times a day: before the 3 meals, and at bedtime.  also check if you have symptoms of your blood sugar being too high or too low.  please keep a record of the readings and bring it to your next appointment here (or you can bring the meter itself).  You can write it on any piece of paper.  please call us sooner if your blood sugar goes below 70, or if you have a lot of readings over 200. Please continue these settings:  basal rate of 2 units/hr (suspend 1-2 hrs for heavy activity). bolus of 1 unit/4 grams carbohydrate. continue correction bolus (which some people call "sensitivity," or "insulin sensitivity ratio," or just "isr") of 1 unit for each 20 by which your glucose exceeds 100.   Please continue the same trulicity for now.   Please come back for a follow-up appointment in 1 month.

## 2018-03-03 ENCOUNTER — Telehealth: Payer: Self-pay | Admitting: Endocrinology

## 2018-03-03 NOTE — Telephone Encounter (Signed)
Called pt to make him aware. States the service dog he is looking at is trained to identify when a patient may be experiencing a diabetic crisis. He would like for you to revisit this matter. Please advise

## 2018-03-03 NOTE — Telephone Encounter (Signed)
Patient called re: patient is trying to get a service for his diabetes but the kennel will not sell him one unless he has a letter from Dr. Loanne Drilling stating that patient needs a service dog. Please call patient at ph# [phone] to advise.

## 2018-03-03 NOTE — Telephone Encounter (Signed)
Please advise if request is appropriate

## 2018-03-03 NOTE — Telephone Encounter (Signed)
Same answer

## 2018-03-03 NOTE — Telephone Encounter (Signed)
Pt does not qualify based on DM. You could ask PCP if there is any other reason he knows of.

## 2018-03-04 NOTE — Telephone Encounter (Signed)
Called pt and informed of Dr. Cordelia Pen response. Verbalized acceptance and understanding.

## 2018-03-05 DIAGNOSIS — Z125 Encounter for screening for malignant neoplasm of prostate: Secondary | ICD-10-CM | POA: Diagnosis not present

## 2018-03-05 DIAGNOSIS — I509 Heart failure, unspecified: Secondary | ICD-10-CM | POA: Diagnosis not present

## 2018-03-05 DIAGNOSIS — E785 Hyperlipidemia, unspecified: Secondary | ICD-10-CM | POA: Diagnosis not present

## 2018-03-17 ENCOUNTER — Encounter (HOSPITAL_COMMUNITY): Payer: Self-pay | Admitting: Psychiatry

## 2018-03-17 ENCOUNTER — Ambulatory Visit (INDEPENDENT_AMBULATORY_CARE_PROVIDER_SITE_OTHER): Payer: BLUE CROSS/BLUE SHIELD | Admitting: Psychiatry

## 2018-03-17 ENCOUNTER — Encounter (HOSPITAL_COMMUNITY): Payer: Self-pay

## 2018-03-17 VITALS — BP 130/78 | Ht 75.0 in | Wt 265.0 lb

## 2018-03-17 DIAGNOSIS — F411 Generalized anxiety disorder: Secondary | ICD-10-CM | POA: Insufficient documentation

## 2018-03-17 DIAGNOSIS — F3181 Bipolar II disorder: Secondary | ICD-10-CM | POA: Diagnosis not present

## 2018-03-17 DIAGNOSIS — F410 Panic disorder [episodic paroxysmal anxiety] without agoraphobia: Secondary | ICD-10-CM | POA: Diagnosis not present

## 2018-03-17 MED ORDER — FLUOXETINE HCL 20 MG PO CAPS: 80.0000 mg | ORAL_CAPSULE | Freq: Every day | ORAL | 0 refills | Status: DC

## 2018-03-17 MED ORDER — BUSPIRONE HCL 10 MG PO TABS: 10.0000 mg | ORAL_TABLET | Freq: Three times a day (TID) | ORAL | 0 refills | Status: DC

## 2018-03-17 MED ORDER — DIVALPROEX SODIUM ER 250 MG PO TB24: 750.0000 mg | ORAL_TABLET | Freq: Every day | ORAL | 0 refills | Status: DC

## 2018-03-17 MED ORDER — HYDROXYZINE HCL 50 MG PO TABS: 50.0000 mg | ORAL_TABLET | Freq: Three times a day (TID) | ORAL | 0 refills | Status: DC

## 2018-03-17 NOTE — Progress Notes (Signed)
Psychiatric Initial Adult Assessment   Patient Identification: Corey Palmer MRN:  [id number] Date of Evaluation:  03/17/2018 Referral Source: London Pepper MD Chief Complaint:  Anxiety - "I run out of meds". Visit Diagnosis:    ICD-10-CM   1. Bipolar 2 disorder (HCC) F31.81   2. GAD (generalized anxiety disorder) F41.1     History of Present Illness:  48 y married male who has been previously followed by [name] [name] for bipolar disorder and anxiety. He no longer is seen by them and his medications were being refilled by PCP. He comes to establish a regular follow up with mental health providers. He has been  Dx with bipolar disorder about 8 years ago. In addition to depressive episodes and panic type anxiety he has been having brief, lasting 2-3 days, episodes on increased energy, decreased need fi\or sleep, irritability, racing thoughts, anger. He has been prescribed Depakote ER 500 mg daily but has been off it for a month and before that he was supposed to have a dose increased. He also has been on fluoxetine, buspirone and hydroxyzine for anxiety. He denies any hx of inpatient hospitalizations, suicidality, psychosis. He does not abuse alcohol or drugs. Patient has diabetes mellitus.  Associated Signs/Symptoms: Depression Symptoms:  depressed mood, anxiety, panic attacks, (Hypo) Manic Symptoms:  Irritable Mood, Labiality of Mood, Anxiety Symptoms:  Panic Symptoms, Psychotic Symptoms:  None PTSD Symptoms: Negative  Past Psychiatric History: see above  Previous Psychotropic Medications: Yes   Substance Abuse History in the last 12 months:  No.  Consequences of Substance Abuse: Negative  Past Medical History:  Past Medical History:  Diagnosis Date  . Anginal pain ([place])   . Anxiety   . Arthritis   . Automatic implantable cardioverter-defibrillator in situ    Medtronic   . Bipolar disorder ([place])   . CAD (coronary artery disease)    Cardiac cath (08/19/2000) - Nonobstructive, 40%  stenosis of LAD,.   . Cancer [facility]) 2013   benign stomach cancer  . CHF (congestive heart failure) ([place])   . Chondrocalcinosis of right knee 06/11/2013  . Chronic systolic heart failure ([place])    2D echo (03/5595) - Systolic function severely reduced., LV EF 20-25%. Akinesis of apical and anteroseptal mycoardium.  last 2D echo (11/2008 ), LV EF 25-30%, diffuse hypokinesis, and grade 1 diastolic dysfunction.  . Depression   . Diabetes uncomplicated adult-type II    on insulin therapy, a1c 12.9 in 01/2011  . Dilated cardiomyopathy ([place])    status post ICD placement in 2008 c/b tearing at the atrial junction resulting in tamponade and urgent thoracotomy  . Dyslipidemia   . Dysrhythmia   . GERD (gastroesophageal reflux disease)   . Gout, unspecified   . Hepatic steatosis 2011   seen on ultrasound.   . History of kidney stones   . Hx of echocardiogram    Echo (04/2013):  Mild LVH, EF 20-25%, mild LAE, mild to mod RVE with mild reduced RVSF.  Marland Kitchen Hypertension   . ICD (implantable cardiac defibrillator) in place 2007  . Myocardial infarction ([place]) 2008  . Obesity (BMI 30.0-34.9)   . Pacemaker   . Paroxysmal atrial fibrillation (HCC)    on chronic coumadin, goal INR 2-3. status post direct current  . Peptic ulcer   . Shortness of breath   . Sleep apnea    does not have CPAP    Past Surgical History:  Procedure Laterality Date  . CARDIAC CATHETERIZATION    . CARDIAC PACEMAKER PLACEMENT    .  CHOLECYSTECTOMY    . COLONOSCOPY    . CORONARY ARTERY BYPASS GRAFT    . ESOPHAGOGASTRODUODENOSCOPY  04/05/2011   Procedure: ESOPHAGOGASTRODUODENOSCOPY (EGD);  Surgeon: Irene Shipper, MD;  Location: [facility] ENDOSCOPY;  Service: Endoscopy;  Laterality: N/A;  . JOINT REPLACEMENT     left knee replacement  . KNEE ARTHROSCOPY Right 06/11/2013   Procedure: RIGHT KNEE ARTHROSCOPY with chondroplasty;  Surgeon: Johnny Bridge, MD;  Location: [place];  Service: Orthopedics;  Laterality: Right;  . TOTAL KNEE ARTHROPLASTY  Left     Family Psychiatric History: reviewed  Family History:  Family History  Problem Relation Age of Onset  . Diabetes Mother   . Coronary artery disease Mother 48  . Gout Mother   . Heart disease Mother   . Heart attack Mother 37  . Diabetes Father   . Coronary artery disease Father 40       Died in a house fire   . Heart disease Father   . Heart disease Brother     Social History:   Social History   Socioeconomic History  . Marital status: Married    Spouse name: Not on file  . Number of children: 3  . Years of education: college  . Highest education level: Not on file  Occupational History  . Occupation: bar tender  Social Needs  . Financial resource strain: Not on file  . Food insecurity:    Worry: Not on file    Inability: Not on file  . Transportation needs:    Medical: Not on file    Non-medical: Not on file  Tobacco Use  . Smoking status: Never Smoker  . Smokeless tobacco: Never Used  Substance and Sexual Activity  . Alcohol use: No  . Drug use: No  . Sexual activity: Yes  Lifestyle  . Physical activity:    Days per week: Not on file    Minutes per session: Not on file  . Stress: Not on file  Relationships  . Social connections:    Talks on phone: Not on file    Gets together: Not on file    Attends religious service: Not on file    Active member of club or organization: Not on file    Attends meetings of clubs or organizations: Not on file    Relationship status: Not on file  Other Topics Concern  . Not on file  Social History Narrative   Lives in [place].   Studied sports medicine in college, got a BS - used to work with the [employer].          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  Allergies:   Allergies  Allergen Reactions  . Orange Fruit Anaphylaxis   . Penicillins Anaphylaxis  . Jardiance [Empagliflozin] Itching    Metabolic Disorder Labs: Lab Results  Component Value Date   HGBA1C 10.4 (A) 02/03/2018   MPG 323.53 08/08/2017   MPG 295 (H) 05/13/2013   No results found for: PROLACTIN Lab Results  Component Value Date   CHOL 183 03/29/2013   TRIG 391.0 (H) 03/29/2013   HDL 31.30 (L) 03/29/2013   CHOLHDL 6 03/29/2013   VLDL 78.2 (H) 03/29/2013   LDLCALC 74 03/29/2013   LDLCALC 130 (H) 03/04/2011   Lab Results  Component Value Date  TSH 2.489 10/15/2011    Therapeutic Level Labs: No results found for: LITHIUM No results found for: CBMZ No results found for: VALPROATE  Current Medications: Current Outpatient Medications  Medication Sig Dispense Refill  . apixaban (ELIQUIS) 5 MG TABS tablet Take 1 tablet (5 mg total) by mouth 2 (two) times daily. 180 tablet 3  . atorvastatin (LIPITOR) 80 MG tablet Take 1 tablet (80 mg total) by mouth daily. 90 tablet 3  . busPIRone (BUSPAR) 10 MG tablet Take 1 tablet (10 mg total) by mouth 3 (three) times daily for 30 days. 90 tablet 0  . carvedilol (COREG) 25 MG tablet Take 0.5 tablets (12.5 mg total) by mouth 2 (two) times daily. 180 tablet 3  . Chlorhexidine Gluconate (TEGADERM CHG DRESSING) (Dressing) MISC Apply 1 each topically daily. Secure pod with tegaderm daily; E10.8 1 each 12  . digoxin (LANOXIN) 0.125 MG tablet Take 1 tablet (0.125 mg total) by mouth daily. 90 tablet 3  . Dulaglutide (TRULICITY) 9.50 DT/2.6ZT SOPN Inject 0.75 mg into the skin once a week. 4 pen 11  . fenofibrate (TRICOR) 145 MG tablet Take 145 mg by mouth daily.    Marland Kitchen FLUoxetine (PROZAC) 20 MG capsule Take 4 capsules (80 mg total) by mouth daily for 30 days. 120 capsule 0  . furosemide (LASIX) 20 MG tablet Take 3 tablets (60 mg total) by mouth daily. 270 tablet 3  . glucose blood (FREESTYLE PRECISION NEO TEST) test strip 1 strip and 1 lancet, each 10 per day.  Uses Omnipod device. 900 each 3  . hydrOXYzine  (ATARAX/VISTARIL) 50 MG tablet Take 1 tablet (50 mg total) by mouth 3 (three) times daily for 30 days. 90 tablet 0  . Insulin Disposable Pump (OMNIPOD 10 PACK) MISC 1 Device by Does not apply route every other day. 45 each 3  . insulin lispro (HUMALOG) 100 UNIT/ML injection For use via pump, total of 80 units per day 80 mL 3  . lisinopril (PRINIVIL,ZESTRIL) 20 MG tablet Take 1 tablet (20 mg total) by mouth daily. 90 tablet 3  . omeprazole (PRILOSEC) 40 MG capsule Take 1 capsule (40 mg total) by mouth daily. (Patient taking differently: Take 80 mg by mouth daily. ) 30 capsule 3  . potassium chloride SA (K-DUR,KLOR-CON) 20 MEQ tablet Take 1 tablet (20 mEq total) by mouth daily. 90 tablet 3  . Continuous Blood Gluc Sensor (DEXCOM G6 SENSOR) MISC 1 Package by Does not apply route as directed. every 10 days (Patient not taking: Reported on 02/03/2018) 3 each 3  . Continuous Blood Gluc Transmit (DEXCOM G6 TRANSMITTER) MISC 1 Package by Does not apply route every 3 (three) months. (Patient not taking: Reported on 02/03/2018) 3 each 3  . divalproex (DEPAKOTE ER) 250 MG 24 hr tablet Take 3 tablets (750 mg total) by mouth daily for 30 days. 90 tablet 0   No current facility-administered medications for this visit.     Musculoskeletal: Strength & Muscle Tone: within normal limits Gait & Station: normal Patient leans: N/A  Psychiatric Specialty Exam: Review of Systems  Constitutional: Negative.   HENT: Negative.   Eyes: Negative.   Respiratory: Negative.   Cardiovascular: Negative.   Gastrointestinal: Negative.   Genitourinary: Negative.   Musculoskeletal: Positive for joint pain.  Skin: Negative.   Neurological: Negative.   Endo/Heme/Allergies: Negative.   Psychiatric/Behavioral: The patient is nervous/anxious.     Blood pressure 130/78, height 6\' 3"  (1.905 m), weight 265 lb (120.2 kg).Body mass index is 33.12 kg/m.  General Appearance: Casual and Fairly Groomed  Eye Contact:  Good  Speech:   Clear and Coherent  Volume:  Normal  Mood:  Anxious and Depressed  Affect:  Full Range  Thought Process:  Goal Directed  Orientation:  Full (Time, Place, and Person)  Thought Content:  Logical  Suicidal Thoughts:  No  Homicidal Thoughts:  No  Memory:  Immediate;   Good Recent;   Good Remote;   Good  Judgement:  Fair  Insight:  Fair  Psychomotor Activity:  Normal  Concentration:  Concentration: Good  Recall:  Good  Fund of Knowledge:Fair  Language: Good  Akathisia:  Negative  Handed:  Right  AIMS (if indicated):  not done  Assets:  Communication Skills Desire for Improvement Housing Transportation  ADL's:  Intact  Cognition: WNL  Sleep:  Fair   Screenings: PHQ2-9     Office Visit from 03/04/2011 in [place] Office Visit from 01/07/2011 in [place] Office Visit from 08/03/2010 in [place]  PHQ-2 Total Score  0  0  0      Assessment and Plan: 48 yo married male with bipolar 2 disorder and panic  Type anxiety presents requesting refills on his medications and to establish regular mental health follow up. He has done well on a combination of Depakote, Prozac, Buspar and hydroxyzine. 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  Dx.:  Bipolar 2 disorder; Panic disorder  Plan: Restart Depakote 750 mg daily, Prozac 80 mg daily, Buspar 10 mg tid and hydroxyzine 50 mg tid. We will write him letter supporting keeping his dog as an emotional support animal.  The plan was discussed with patient. I spend 45 minutes in direct face to face clinical contact with the patient and devoted approximately 50% of this time to explanation of diagnosis, discussion of treatment options and med education. Patient will have valproic acid level checked next week and return to the  clinic in one month.  Stephanie Acre, MD 2/18/202010:04 AM

## 2018-03-23 ENCOUNTER — Other Ambulatory Visit (HOSPITAL_COMMUNITY): Payer: Self-pay

## 2018-03-23 DIAGNOSIS — Z79899 Other long term (current) drug therapy: Secondary | ICD-10-CM

## 2018-03-23 DIAGNOSIS — E1165 Type 2 diabetes mellitus with hyperglycemia: Secondary | ICD-10-CM | POA: Diagnosis not present

## 2018-03-24 LAB — COMPREHENSIVE METABOLIC PANEL
ALT: 26 IU/L (ref 0–44)
AST: 26 IU/L (ref 0–40)
Albumin/Globulin Ratio: 2.6 — ABNORMAL HIGH (ref 1.2–2.2)
Albumin: 4.1 g/dL (ref 4.0–5.0)
Alkaline Phosphatase: 73 IU/L (ref 39–117)
BUN/Creatinine Ratio: 12 (ref 9–20)
BUN: 18 mg/dL (ref 6–24)
Bilirubin Total: 0.4 mg/dL (ref 0.0–1.2)
CO2: 24 mmol/L (ref 20–29)
Calcium: 9.1 mg/dL (ref 8.7–10.2)
Chloride: 103 mmol/L (ref 96–106)
Creatinine, Ser: 1.53 mg/dL — ABNORMAL HIGH (ref 0.76–1.27)
GFR calc Af Amer: 61 mL/min/{1.73_m2} (ref >59)
GFR calc non Af Amer: 53 mL/min/{1.73_m2} — ABNORMAL LOW (ref >59)
Globulin, Total: 1.6 g/dL (ref 1.5–4.5)
Glucose: 170 mg/dL — ABNORMAL HIGH (ref 65–99)
Potassium: 4.9 mmol/L (ref 3.5–5.2)
Sodium: 139 mmol/L (ref 134–144)
Total Protein: 5.7 g/dL — ABNORMAL LOW (ref 6.0–8.5)

## 2018-03-24 LAB — CBC WITH DIFFERENTIAL/PLATELET
Basophils Absolute: 0 10*3/uL (ref 0.0–0.2)
Basos: 0 %
EOS (ABSOLUTE): 0.2 10*3/uL (ref 0.0–0.4)
Eos: 3 %
Hematocrit: 47 % (ref 37.5–51.0)
Hemoglobin: 16.2 g/dL (ref 13.0–17.7)
Immature Grans (Abs): 0 10*3/uL (ref 0.0–0.1)
Immature Granulocytes: 0 %
Lymphocytes Absolute: 2.5 10*3/uL (ref 0.7–3.1)
Lymphs: 31 %
MCH: 28.2 pg (ref 26.6–33.0)
MCHC: 34.5 g/dL (ref 31.5–35.7)
MCV: 82 fL (ref 79–97)
Monocytes Absolute: 0.7 10*3/uL (ref 0.1–0.9)
Monocytes: 8 %
Neutrophils Absolute: 4.8 10*3/uL (ref 1.4–7.0)
Neutrophils: 58 %
Platelets: 251 10*3/uL (ref 150–450)
RBC: 5.74 x10E6/uL (ref 4.14–5.80)
RDW: 15 % (ref 11.6–15.4)
WBC: 8.3 10*3/uL (ref 3.4–10.8)

## 2018-03-24 LAB — VALPROIC ACID LEVEL: Valproic Acid Lvl: 24 ug/mL — ABNORMAL LOW (ref 50–100)

## 2018-03-26 ENCOUNTER — Other Ambulatory Visit: Payer: Self-pay

## 2018-03-26 ENCOUNTER — Ambulatory Visit: Payer: BLUE CROSS/BLUE SHIELD | Admitting: Endocrinology

## 2018-03-26 ENCOUNTER — Encounter: Payer: Self-pay | Admitting: Endocrinology

## 2018-03-26 VITALS — BP 140/80 | HR 102 | Ht 75.0 in | Wt 264.4 lb

## 2018-03-26 DIAGNOSIS — E1165 Type 2 diabetes mellitus with hyperglycemia: Secondary | ICD-10-CM

## 2018-03-26 MED ORDER — DULAGLUTIDE 1.5 MG/0.5ML ~~LOC~~ SOAJ: 1.5000 mg | SUBCUTANEOUS | 3 refills | Status: DC

## 2018-03-26 NOTE — Progress Notes (Signed)
Subjective:    Patient ID: Corey Palmer, male    DOB: Nov 15, 1970, 48 y.o.   MRN: [id number]  HPI Pt returns for f/u of diabetes mellitus:  DM type: Insulin-requiring type 2 Dx'ed: 5885 Complications: polyneuropathy, CAD, and renal insuff.  Therapy: insulin since 2011 (Omnipod since 2020).   DKA: never Severe hypoglycemia: once (2015) Pancreatitis: never Pancreatic imaging: normal on 2013 CT Other: he stopped Ozempic, as it needs PA; ins declines farxiga; he declines continuous glucose monitor, due to cost; he works as heavy Office manager.    Interval history:   he takes these settings:  basal rate of 2 units/hr (suspend 1-2 hrs for heavy activity). bolus of 1 unit/4 grams carbohydrate. correction bolus (which some people call "sensitivity," or "insulin sensitivity ratio," or just "isr") of 1 unit for each 20 by which glucose exceeds 100.   TDD is 50 units (28 of which is basal).   Meter is downloaded today, and the printout is scanned into the record.  cbg varies from 110-350.  It is in general highest at HS.   Past Medical History:  Diagnosis Date  . Anginal pain ([place])   . Anxiety   . Arthritis   . Automatic implantable cardioverter-defibrillator in situ    Medtronic   . Bipolar disorder ([place])   . CAD (coronary artery disease)    Cardiac cath (08/19/2000) - Nonobstructive, 40% stenosis of LAD,.   . Cancer [facility]) 2013   benign stomach cancer  . CHF (congestive heart failure) ([place])   . Chondrocalcinosis of right knee 06/11/2013  . Chronic systolic heart failure ([place])    2D echo (03/7739) - Systolic function severely reduced., LV EF 20-25%. Akinesis of apical and anteroseptal mycoardium.  last 2D echo (11/2008 ), LV EF 25-30%, diffuse hypokinesis, and grade 1 diastolic dysfunction.  . Depression   . Diabetes uncomplicated adult-type II    on insulin therapy, a1c 12.9 in 01/2011  . Dilated cardiomyopathy ([place])    status post ICD placement in 2008 c/b tearing at the atrial  junction resulting in tamponade and urgent thoracotomy  . Dyslipidemia   . Dysrhythmia   . GERD (gastroesophageal reflux disease)   . Gout, unspecified   . Hepatic steatosis 2011   seen on ultrasound.   . History of kidney stones   . Hx of echocardiogram    Echo (04/2013):  Mild LVH, EF 20-25%, mild LAE, mild to mod RVE with mild reduced RVSF.  Marland Kitchen Hypertension   . ICD (implantable cardiac defibrillator) in place 2007  . Myocardial infarction ([place]) 2008  . Obesity (BMI 30.0-34.9)   . Pacemaker   . Paroxysmal atrial fibrillation (HCC)    on chronic coumadin, goal INR 2-3. status post direct current  . Peptic ulcer   . Shortness of breath   . Sleep apnea    does not have CPAP    Past Surgical History:  Procedure Laterality Date  . CARDIAC CATHETERIZATION    . CARDIAC PACEMAKER PLACEMENT    . CHOLECYSTECTOMY    . COLONOSCOPY    . CORONARY ARTERY BYPASS GRAFT    . ESOPHAGOGASTRODUODENOSCOPY  04/05/2011   Procedure: ESOPHAGOGASTRODUODENOSCOPY (EGD);  Surgeon: Irene Shipper, MD;  Location: [facility] ENDOSCOPY;  Service: Endoscopy;  Laterality: N/A;  . JOINT REPLACEMENT     left knee replacement  . KNEE ARTHROSCOPY Right 06/11/2013   Procedure: RIGHT KNEE ARTHROSCOPY with chondroplasty;  Surgeon: Johnny Bridge, MD;  Location: [place];  Service: Orthopedics;  Laterality:  Right;  Marland Kitchen TOTAL KNEE ARTHROPLASTY Left     Social History   Socioeconomic History  . Marital status: Married    Spouse name: Not on file  . Number of children: 3  . Years of education: college  . Highest education level: Not on file  Occupational History  . Occupation: bar tender  Social Needs  . Financial resource strain: Not on file  . Food insecurity:    Worry: Not on file    Inability: Not on file  . Transportation needs:    Medical: Not on file    Non-medical: Not on file  Tobacco Use  . Smoking status: Never Smoker  . Smokeless tobacco: Never Used  Substance and Sexual Activity  . Alcohol use: No  . Drug  use: No  . Sexual activity: Yes  Lifestyle  . Physical activity:    Days per week: Not on file    Minutes per session: Not on file  . Stress: Not on file  Relationships  . Social connections:    Talks on phone: Not on file    Gets together: Not on file    Attends religious service: Not on file    Active member of club or organization: Not on file    Attends meetings of clubs or organizations: Not on file    Relationship status: Not on file  . Intimate partner violence:    Fear of current or ex partner: Not on file    Emotionally abused: Not on file    Physically abused: Not on file    Forced sexual activity: Not on file  Other Topics Concern  . Not on file  Social History Narrative   Lives in [place].   Studied sports medicine in college, got a BS - used to work with the [employer].          Current Outpatient Medications on File Prior to Visit  Medication Sig Dispense Refill  . apixaban (ELIQUIS) 5 MG TABS tablet Take 1 tablet (5 mg total) by mouth 2 (two) times daily. 180 tablet 3  . atorvastatin (LIPITOR) 80 MG tablet Take 1 tablet (80 mg total) by mouth daily. 90 tablet 3  . busPIRone (BUSPAR) 10 MG tablet Take 1 tablet (10 mg total) by mouth 3 (three) times daily for 30 days. 90 tablet 0  . carvedilol (COREG) 25 MG tablet Take 0.5 tablets (12.5 mg total) by mouth 2 (two) times daily. 180 tablet 3  . Chlorhexidine Gluconate (TEGADERM CHG DRESSING) (Dressing) MISC Apply 1 each topically daily. Secure pod with tegaderm daily; E10.8 1 each 12  . Continuous Blood Gluc Sensor (DEXCOM G6 SENSOR) MISC 1 Package by Does not apply route as directed. every 10 days 3 each 3  . Continuous Blood Gluc Transmit (DEXCOM G6 TRANSMITTER) MISC 1 Package by Does not apply route every 3 (three) months. 3 each 3  . digoxin (LANOXIN) 0.125 MG tablet Take 1 tablet (0.125 mg total) by mouth daily. 90 tablet 3  . divalproex (DEPAKOTE ER) 250 MG 24 hr tablet Take 3 tablets (750 mg total) by mouth  daily for 30 days. 90 tablet 0  . fenofibrate (TRICOR) 145 MG tablet Take 145 mg by mouth daily.    Marland Kitchen FLUoxetine (PROZAC) 20 MG capsule Take 4 capsules (80 mg total) by mouth daily for 30 days. 120 capsule 0  . furosemide (LASIX) 20 MG tablet Take 3 tablets (60 mg total) by mouth daily. 270 tablet 3  . glucose blood (  FREESTYLE PRECISION NEO TEST) test strip 1 strip and 1 lancet, each 10 per day.  Uses Omnipod device. 900 each 3  . hydrOXYzine (ATARAX/VISTARIL) 50 MG tablet Take 1 tablet (50 mg total) by mouth 3 (three) times daily for 30 days. 90 tablet 0  . Insulin Disposable Pump (OMNIPOD 10 PACK) MISC 1 Device by Does not apply route every other day. 45 each 3  . insulin lispro (HUMALOG) 100 UNIT/ML injection For use via pump, total of 80 units per day 80 mL 3  . lisinopril (PRINIVIL,ZESTRIL) 20 MG tablet Take 1 tablet (20 mg total) by mouth daily. 90 tablet 3  . omeprazole (PRILOSEC) 40 MG capsule Take 1 capsule (40 mg total) by mouth daily. (Patient taking differently: Take 80 mg by mouth daily. ) 30 capsule 3  . potassium chloride SA (K-DUR,KLOR-CON) 20 MEQ tablet Take 1 tablet (20 mEq total) by mouth daily. 90 tablet 3   No current facility-administered medications on file prior to visit.     Allergies  Allergen Reactions  . Orange Fruit Anaphylaxis  . Penicillins Anaphylaxis  . Jardiance [Empagliflozin] Itching    Family History  Problem Relation Age of Onset  . Diabetes Mother   . Coronary artery disease Mother 63  . Gout Mother   . Heart disease Mother   . Heart attack Mother 33  . Diabetes Father   . Coronary artery disease Father 23       Died in a house fire   . Heart disease Father   . Heart disease Brother     BP 140/80 (BP Location: Left Arm, Patient Position: Sitting, Cuff Size: Large)   Pulse (!) 102   Ht 6\' 3"  (1.905 m)   Wt 264 lb 6.4 oz (119.9 kg)   SpO2 97%   BMI 33.05 kg/m    Review of Systems He denies hypoglycemia.     Objective:   Physical  Exam VITAL SIGNS:  See vs page GENERAL: no distress Pulses: dorsalis pedis intact bilat.   MSK: no deformity of the feet CV: no leg edema Skin:  no ulcer on the feet.  normal color and temp on the feet. Neuro: sensation is intact to touch on the feet      Assessment & Plan:  Insulin-requiring type 2 DM: he needs increased rx.    Patient Instructions  check your blood sugar 10 times a day: before the 3 meals, and at bedtime.  also check if you have symptoms of your blood sugar being too high or too low.  please keep a record of the readings and bring it to your next appointment here (or you can bring the meter itself).  You can write it on any piece of paper.  please call us sooner if your blood sugar goes below 70, or if you have a lot of readings over 200. Please take these settings:  basal rate of 1.9 units/hr (suspend 1-2 hrs for heavy activity). bolus of 1 unit/4 grams carbohydrate, but also please take an extra 4 units with supper.   continue correction bolus (which some people call "sensitivity," or "insulin sensitivity ratio," or just "isr") of 1 unit for each 20 by which your glucose exceeds 100.   I have sent a prescription to your pharmacy, to double the trulicity. Please come back for a follow-up appointment in 6 weeks.

## 2018-03-26 NOTE — Patient Instructions (Addendum)
check your blood sugar 10 times a day: before the 3 meals, and at bedtime.  also check if you have symptoms of your blood sugar being too high or too low.  please keep a record of the readings and bring it to your next appointment here (or you can bring the meter itself).  You can write it on any piece of paper.  please call us sooner if your blood sugar goes below 70, or if you have a lot of readings over 200. Please take these settings:  basal rate of 1.9 units/hr (suspend 1-2 hrs for heavy activity). bolus of 1 unit/4 grams carbohydrate, but also please take an extra 4 units with supper.   continue correction bolus (which some people call "sensitivity," or "insulin sensitivity ratio," or just "isr") of 1 unit for each 20 by which your glucose exceeds 100.   I have sent a prescription to your pharmacy, to double the trulicity. Please come back for a follow-up appointment in 6 weeks.

## 2018-04-13 ENCOUNTER — Other Ambulatory Visit (HOSPITAL_COMMUNITY): Payer: Self-pay | Admitting: Psychiatry

## 2018-04-14 ENCOUNTER — Other Ambulatory Visit (HOSPITAL_COMMUNITY): Payer: Self-pay

## 2018-04-14 ENCOUNTER — Ambulatory Visit (HOSPITAL_COMMUNITY): Payer: Self-pay | Admitting: Psychiatry

## 2018-04-14 MED ORDER — HYDROXYZINE HCL 50 MG PO TABS: 50.0000 mg | ORAL_TABLET | Freq: Three times a day (TID) | ORAL | 0 refills | Status: AC

## 2018-04-14 MED ORDER — FLUOXETINE HCL 20 MG PO CAPS: 80.0000 mg | ORAL_CAPSULE | Freq: Every day | ORAL | 0 refills | Status: DC

## 2018-04-14 MED ORDER — BUSPIRONE HCL 10 MG PO TABS: 10.0000 mg | ORAL_TABLET | Freq: Three times a day (TID) | ORAL | 0 refills | Status: AC

## 2018-04-14 MED ORDER — DIVALPROEX SODIUM ER 250 MG PO TB24: 750.0000 mg | ORAL_TABLET | Freq: Every day | ORAL | 0 refills | Status: DC

## 2018-05-06 ENCOUNTER — Telehealth: Payer: Self-pay | Admitting: Endocrinology

## 2018-05-06 NOTE — Telephone Encounter (Signed)
Per [facility], "Caller states that he is having technical difficulties with his omnipod. He would like  Call back from the office with advise on what he needs to do."

## 2018-05-07 MED ORDER — INSULIN GLARGINE 100 UNIT/ML SOLOSTAR PEN: 50.0000 [IU] | PEN_INJECTOR | SUBCUTANEOUS | 99 refills | Status: DC

## 2018-05-07 NOTE — Telephone Encounter (Signed)
Called pt and left detailed VM informing him of orders.

## 2018-05-07 NOTE — Telephone Encounter (Signed)
Ok, I have sent a prescription to your pharmacy, for lantus 50 units qd.  Also, take 5 units of humalog, for any cbg over 200

## 2018-05-07 NOTE — Telephone Encounter (Signed)
Pt has been advised to contact company re: his technical issue. In the meantime, will send message to Dr. Loanne Drilling re: insulin dosage until technical issues have been resolved.  Please advise

## 2018-05-18 ENCOUNTER — Encounter: Payer: Self-pay | Admitting: Endocrinology

## 2018-05-18 ENCOUNTER — Other Ambulatory Visit (HOSPITAL_COMMUNITY): Payer: Self-pay

## 2018-05-18 MED ORDER — FLUOXETINE HCL 20 MG PO CAPS: 80.0000 mg | ORAL_CAPSULE | Freq: Every day | ORAL | 0 refills | Status: DC

## 2018-05-18 MED ORDER — DIVALPROEX SODIUM ER 250 MG PO TB24: 750.0000 mg | ORAL_TABLET | Freq: Every day | ORAL | 0 refills | Status: DC

## 2018-05-19 ENCOUNTER — Other Ambulatory Visit: Payer: Self-pay

## 2018-05-19 ENCOUNTER — Ambulatory Visit (INDEPENDENT_AMBULATORY_CARE_PROVIDER_SITE_OTHER): Payer: BLUE CROSS/BLUE SHIELD | Admitting: Endocrinology

## 2018-05-19 DIAGNOSIS — E1165 Type 2 diabetes mellitus with hyperglycemia: Secondary | ICD-10-CM

## 2018-05-19 NOTE — Progress Notes (Signed)
Subjective:    Patient ID: Corey Palmer, male    DOB: August 31, 1970, 48 y.o.   MRN: [id number]  HPI Pt is here for telehealth visit, via doxy video visit.  Alternatives to telehealth are presented to this patient, and the patient agrees to the visit. She is advised of the cost of the visit, and she agrees to this, also.   Patient is at work, and I am at the office.   Pt returns for f/u of diabetes mellitus:  DM type: Insulin-requiring type 2 Dx'ed: 9381 Complications: polyneuropathy, CAD, and renal insuff.   Therapy: insulin since 2011 (Omnipod since 2020).   DKA: never Severe hypoglycemia: once (2015) Pancreatitis: never Pancreatic imaging: normal on 2013 CT Other: ins declines farxiga; he declines continuous glucose monitor, due to cost; he works as heavy Office manager.    Interval history:  he takes these settings:  basal rate of 1.9 units/hr (suspends 1-2 hrs for heavy activity).   bolus of 1 unit/4 grams carbohydrate, but he takes an extra 4 units with supper.   correction bolus (which some people call "sensitivity," or "insulin sensitivity ratio," or just "isr") of 1 unit for each 20 by which glucose exceeds 100.   Pt says cbg varies from 80-120.  pt states he feels well in general.  Past Medical History:  Diagnosis Date  . Anginal pain ([place])   . Anxiety   . Arthritis   . Automatic implantable cardioverter-defibrillator in situ    Medtronic   . Bipolar disorder ([place])   . CAD (coronary artery disease)    Cardiac cath (08/19/2000) - Nonobstructive, 40% stenosis of LAD,.   . Cancer [facility]) 2013   benign stomach cancer  . CHF (congestive heart failure) ([place])   . Chondrocalcinosis of right knee 06/11/2013  . Chronic systolic heart failure ([place])    2D echo (01/7508) - Systolic function severely reduced., LV EF 20-25%. Akinesis of apical and anteroseptal mycoardium.  last 2D echo (11/2008 ), LV EF 25-30%, diffuse hypokinesis, and grade 1 diastolic dysfunction.  . Depression   .  Diabetes uncomplicated adult-type II    on insulin therapy, a1c 12.9 in 01/2011  . Dilated cardiomyopathy ([place])    status post ICD placement in 2008 c/b tearing at the atrial junction resulting in tamponade and urgent thoracotomy  . Dyslipidemia   . Dysrhythmia   . GERD (gastroesophageal reflux disease)   . Gout, unspecified   . Hepatic steatosis 2011   seen on ultrasound.   . History of kidney stones   . Hx of echocardiogram    Echo (04/2013):  Mild LVH, EF 20-25%, mild LAE, mild to mod RVE with mild reduced RVSF.  Marland Kitchen Hypertension   . ICD (implantable cardiac defibrillator) in place 2007  . Myocardial infarction ([place]) 2008  . Obesity (BMI 30.0-34.9)   . Pacemaker   . Paroxysmal atrial fibrillation (HCC)    on chronic coumadin, goal INR 2-3. status post direct current  . Peptic ulcer   . Shortness of breath   . Sleep apnea    does not have CPAP    Past Surgical History:  Procedure Laterality Date  . CARDIAC CATHETERIZATION    . CARDIAC PACEMAKER PLACEMENT    . CHOLECYSTECTOMY    . COLONOSCOPY    . CORONARY ARTERY BYPASS GRAFT    . ESOPHAGOGASTRODUODENOSCOPY  04/05/2011   Procedure: ESOPHAGOGASTRODUODENOSCOPY (EGD);  Surgeon: Irene Shipper, MD;  Location[facility]COPY;  Service: Endoscopy;  Laterality: N/A;  .  JOINT REPLACEMENT     left knee replacement  . KNEE ARTHROSCOPY Right 06/11/2013   Procedure: RIGHT KNEE ARTHROSCOPY with chondroplasty;  Surgeon: Johnny Bridge, MD;  Location: [place];  Service: Orthopedics;  Laterality: Right;  . TOTAL KNEE ARTHROPLASTY Left     Social History   Socioeconomic History  . Marital status: Married    Spouse name: Not on file  . Number of children: 3  . Years of education: college  . Highest education level: Not on file  Occupational History  . Occupation: bar tender  Social Needs  . Financial resource strain: Not on file  . Food insecurity:    Worry: Not on file    Inability: Not on file  . Transportation needs:    Medical: Not  on file    Non-medical: Not on file  Tobacco Use  . Smoking status: Never Smoker  . Smokeless tobacco: Never Used  Substance and Sexual Activity  . Alcohol use: No  . Drug use: No  . Sexual activity: Yes  Lifestyle  . Physical activity:    Days per week: Not on file    Minutes per session: Not on file  . Stress: Not on file  Relationships  . Social connections:    Talks on phone: Not on file    Gets together: Not on file    Attends religious service: Not on file    Active member of club or organization: Not on file    Attends meetings of clubs or organizations: Not on file    Relationship status: Not on file  . Intimate partner violence:    Fear of current or ex partner: Not on file    Emotionally abused: Not on file    Physically abused: Not on file    Forced sexual activity: Not on file  Other Topics Concern  . Not on file  Social History Narrative   Lives in [place].   Studied sports medicine in college, got a BS - used to work with the [employer].          Current Outpatient Medications on File Prior to Visit  Medication Sig Dispense Refill  . apixaban (ELIQUIS) 5 MG TABS tablet Take 1 tablet (5 mg total) by mouth 2 (two) times daily. 180 tablet 3  . atorvastatin (LIPITOR) 80 MG tablet Take 1 tablet (80 mg total) by mouth daily. 90 tablet 3  . carvedilol (COREG) 25 MG tablet Take 0.5 tablets (12.5 mg total) by mouth 2 (two) times daily. 180 tablet 3  . Chlorhexidine Gluconate (TEGADERM CHG DRESSING) (Dressing) MISC Apply 1 each topically daily. Secure pod with tegaderm daily; E10.8 1 each 12  . Continuous Blood Gluc Sensor (DEXCOM G6 SENSOR) MISC 1 Package by Does not apply route as directed. every 10 days 3 each 3  . Continuous Blood Gluc Transmit (DEXCOM G6 TRANSMITTER) MISC 1 Package by Does not apply route every 3 (three) months. 3 each 3  . digoxin (LANOXIN) 0.125 MG tablet Take 1 tablet (0.125 mg total) by mouth daily. 90 tablet 3  . divalproex (DEPAKOTE ER)  250 MG 24 hr tablet Take 3 tablets (750 mg total) by mouth daily for 30 days. 30 tablet 0  . Dulaglutide (TRULICITY) 1.5 KK/9.3GH SOPN Inject 1.5 mg into the skin once a week. 12 pen 3  . fenofibrate (TRICOR) 145 MG tablet Take 145 mg by mouth daily.    Marland Kitchen FLUoxetine (PROZAC) 20 MG capsule Take 4 capsules (80 mg total)  by mouth daily for 30 days. 120 capsule 0  . furosemide (LASIX) 20 MG tablet Take 3 tablets (60 mg total) by mouth daily. 270 tablet 3  . glucose blood (FREESTYLE PRECISION NEO TEST) test strip 1 strip and 1 lancet, each 10 per day.  Uses Omnipod device. 900 each 3  . Insulin Disposable Pump (OMNIPOD 10 PACK) MISC 1 Device by Does not apply route every other day. 45 each 3  . insulin lispro (HUMALOG) 100 UNIT/ML injection For use via pump, total of 80 units per day 80 mL 3  . lisinopril (PRINIVIL,ZESTRIL) 20 MG tablet Take 1 tablet (20 mg total) by mouth daily. 90 tablet 3  . omeprazole (PRILOSEC) 40 MG capsule Take 1 capsule (40 mg total) by mouth daily. (Patient taking differently: Take 80 mg by mouth daily. ) 30 capsule 3  . potassium chloride SA (K-DUR,KLOR-CON) 20 MEQ tablet Take 1 tablet (20 mEq total) by mouth daily. 90 tablet 3   No current facility-administered medications on file prior to visit.     Allergies  Allergen Reactions  . Orange Fruit Anaphylaxis  . Penicillins Anaphylaxis  . Jardiance [Empagliflozin] Itching    Family History  Problem Relation Age of Onset  . Diabetes Mother   . Coronary artery disease Mother 60  . Gout Mother   . Heart disease Mother   . Heart attack Mother 72  . Diabetes Father   . Coronary artery disease Father 41       Died in a house fire   . Heart disease Father   . Heart disease Brother      Review of Systems He denies hypoglycemia.  He has lost 10 lbs recently.      Objective:   Physical Exam    Lab Results  Component Value Date   CREATININE 1.53 (H) 03/23/2018   BUN 18 03/23/2018   NA 139 03/23/2018   K 4.9  03/23/2018   CL 103 03/23/2018   CO2 24 03/23/2018      Assessment & Plan:  Insulin-requiring type 2 DM, with renal insuff: we discussed.  he declines A1c for now.   CAD: in this setting, he should avoid hypoglycemia.   Patient Instructions  check your blood sugar 10 times a day: before the 3 meals, and at bedtime.  also check if you have symptoms of your blood sugar being too high or too low.  please keep a record of the readings and bring it to your next appointment here (or you can bring the meter itself).  You can write it on any piece of paper.  please call us sooner if your blood sugar goes below 70, or if you have a lot of readings over 200. Please take these settings:  basal rate of 1.9 units/hr (suspend 1-2 hrs for heavy activity). bolus of 1 unit/4 grams carbohydrate, but also please take an extra 4 units with supper.   continue correction bolus (which some people call "sensitivity," or "insulin sensitivity ratio," or just "isr") of 1 unit for each 20 by which your glucose exceeds 100.   Please come back for a follow-up appointment in 2 months.

## 2018-05-19 NOTE — Patient Instructions (Addendum)
check your blood sugar 10 times a day: before the 3 meals, and at bedtime.  also check if you have symptoms of your blood sugar being too high or too low.  please keep a record of the readings and bring it to your next appointment here (or you can bring the meter itself).  You can write it on any piece of paper.  please call us sooner if your blood sugar goes below 70, or if you have a lot of readings over 200. Please take these settings:  basal rate of 1.9 units/hr (suspend 1-2 hrs for heavy activity). bolus of 1 unit/4 grams carbohydrate, but also please take an extra 4 units with supper.   continue correction bolus (which some people call "sensitivity," or "insulin sensitivity ratio," or just "isr") of 1 unit for each 20 by which your glucose exceeds 100.   Please come back for a follow-up appointment in 2 months.

## 2018-05-26 ENCOUNTER — Other Ambulatory Visit (HOSPITAL_COMMUNITY): Payer: Self-pay

## 2018-05-26 MED ORDER — DIVALPROEX SODIUM ER 250 MG PO TB24: 750.0000 mg | ORAL_TABLET | Freq: Every day | ORAL | 0 refills | Status: DC

## 2018-05-29 DIAGNOSIS — R1110 Vomiting, unspecified: Secondary | ICD-10-CM | POA: Diagnosis not present

## 2018-07-02 ENCOUNTER — Other Ambulatory Visit: Payer: Self-pay

## 2018-07-02 ENCOUNTER — Ambulatory Visit (INDEPENDENT_AMBULATORY_CARE_PROVIDER_SITE_OTHER): Payer: BC Managed Care – PPO | Admitting: Endocrinology

## 2018-07-02 DIAGNOSIS — E1165 Type 2 diabetes mellitus with hyperglycemia: Secondary | ICD-10-CM

## 2018-07-02 MED ORDER — INSULIN GLARGINE 100 UNIT/ML SOLOSTAR PEN: 50.0000 [IU] | PEN_INJECTOR | SUBCUTANEOUS | 99 refills | Status: DC

## 2018-07-02 NOTE — Progress Notes (Signed)
Subjective:    Patient ID: Corey Palmer, male    DOB: March 10, 1970, 48 y.o.   MRN: [id number]  HPI telehealth visit today via doxy video visit.  Alternatives to telehealth are presented to this patient, and the patient agrees to the telehealth visit.  Pt is advised of the cost of the visit, and agrees to this, also.   Patient is in his car, and I am at the office.   Persons attending the telehealth visit: the patient and I Pt returns for f/u of diabetes mellitus:  DM type: Insulin-requiring type 2.  Dx'ed: 3664 Complications: polyneuropathy, CAD, and renal insuff.   Therapy: insulin since 4034, and trulicity.   DKA: never Severe hypoglycemia: once (2015) Pancreatitis: never Pancreatic imaging: normal on 2013 CT Other: ins declines farxiga; he declines continuous glucose monitor, due to cost; he works as heavy Office manager.    Interval history:  he takes these settings:  basal rate of 1.9 units/hr (suspend 1-2 hrs for heavy activity). bolus of 1 unit/4 grams carbohydrate, but also please take an extra 4 units with supper.   continue correction bolus (which some people call "sensitivity," or "insulin sensitivity ratio," or just "isr") of 1 unit for each 20 by which your glucose exceeds 100.   He stopped Omnipods and continuous glucose monitor, 2 weeks ago, due to cost.  He takes lantus, 50 units qd, and novolog, PRN, for a total of approx 5-10 units per day.  says cbg varies from 80-110.   Past Medical History:  Diagnosis Date  . Anginal pain ([place])   . Anxiety   . Arthritis   . Automatic implantable cardioverter-defibrillator in situ    Medtronic   . Bipolar disorder ([place])   . CAD (coronary artery disease)    Cardiac cath (08/19/2000) - Nonobstructive, 40% stenosis of LAD,.   . Cancer [facility]) 2013   benign stomach cancer  . CHF (congestive heart failure) ([place])   . Chondrocalcinosis of right knee 06/11/2013  . Chronic systolic heart failure ([place])    2D echo (74/2595) - Systolic  function severely reduced., LV EF 20-25%. Akinesis of apical and anteroseptal mycoardium.  last 2D echo (11/2008 ), LV EF 25-30%, diffuse hypokinesis, and grade 1 diastolic dysfunction.  . Depression   . Diabetes uncomplicated adult-type II    on insulin therapy, a1c 12.9 in 01/2011  . Dilated cardiomyopathy ([place])    status post ICD placement in 2008 c/b tearing at the atrial junction resulting in tamponade and urgent thoracotomy  . Dyslipidemia   . Dysrhythmia   . GERD (gastroesophageal reflux disease)   . Gout, unspecified   . Hepatic steatosis 2011   seen on ultrasound.   . History of kidney stones   . Hx of echocardiogram    Echo (04/2013):  Mild LVH, EF 20-25%, mild LAE, mild to mod RVE with mild reduced RVSF.  Marland Kitchen Hypertension   . ICD (implantable cardiac defibrillator) in place 2007  . Myocardial infarction ([place]) 2008  . Obesity (BMI 30.0-34.9)   . Pacemaker   . Paroxysmal atrial fibrillation (HCC)    on chronic coumadin, goal INR 2-3. status post direct current  . Peptic ulcer   . Shortness of breath   . Sleep apnea    does not have CPAP    Past Surgical History:  Procedure Laterality Date  . CARDIAC CATHETERIZATION    . CARDIAC PACEMAKER PLACEMENT    . CHOLECYSTECTOMY    . COLONOSCOPY    .  CORONARY ARTERY BYPASS GRAFT    . ESOPHAGOGASTRODUODENOSCOPY  04/05/2011   Procedure: ESOPHAGOGASTRODUODENOSCOPY (EGD);  Surgeon: Irene Shipper, MD;  Location[facility] ENDOSCOPY;  Service: Endoscopy;  Laterality: N/A;  . JOINT REPLACEMENT     left knee replacement  . KNEE ARTHROSCOPY Right 06/11/2013   Procedure: RIGHT KNEE ARTHROSCOPY with chondroplasty;  Surgeon: Johnny Bridge, MD;  Location: [place];  Service: Orthopedics;  Laterality: Right;  . TOTAL KNEE ARTHROPLASTY Left     Social History   Socioeconomic History  . Marital status: Married    Spouse name: Not on file  . Number of children: 3  . Years of education: college  . Highest education level: Not on file  Occupational  History  . Occupation: bar tender  Social Needs  . Financial resource strain: Not on file  . Food insecurity:    Worry: Not on file    Inability: Not on file  . Transportation needs:    Medical: Not on file    Non-medical: Not on file  Tobacco Use  . Smoking status: Never Smoker  . Smokeless tobacco: Never Used  Substance and Sexual Activity  . Alcohol use: No  . Drug use: No  . Sexual activity: Yes  Lifestyle  . Physical activity:    Days per week: Not on file    Minutes per session: Not on file  . Stress: Not on file  Relationships  . Social connections:    Talks on phone: Not on file    Gets together: Not on file    Attends religious service: Not on file    Active member of club or organization: Not on file    Attends meetings of clubs or organizations: Not on file    Relationship status: Not on file  . Intimate partner violence:    Fear of current or ex partner: Not on file    Emotionally abused: Not on file    Physically abused: Not on file    Forced sexual activity: Not on file  Other Topics Concern  . Not on file  Social History Narrative   Lives in [place].   Studied sports medicine in college, got a BS - used to work with the [employer].          Current Outpatient Medications on File Prior to Visit  Medication Sig Dispense Refill  . apixaban (ELIQUIS) 5 MG TABS tablet Take 1 tablet (5 mg total) by mouth 2 (two) times daily. 180 tablet 3  . atorvastatin (LIPITOR) 80 MG tablet Take 1 tablet (80 mg total) by mouth daily. 90 tablet 3  . carvedilol (COREG) 25 MG tablet Take 0.5 tablets (12.5 mg total) by mouth 2 (two) times daily. 180 tablet 3  . digoxin (LANOXIN) 0.125 MG tablet Take 1 tablet (0.125 mg total) by mouth daily. 90 tablet 3  . Dulaglutide (TRULICITY) 1.5 JH/4.1DE SOPN Inject 1.5 mg into the skin once a week. 12 pen 3  . fenofibrate (TRICOR) 145 MG tablet Take 145 mg by mouth daily.    . furosemide (LASIX) 20 MG tablet Take 3 tablets (60 mg  total) by mouth daily. 270 tablet 3  . glucose blood (FREESTYLE PRECISION NEO TEST) test strip 1 strip and 1 lancet, each 10 per day.  Uses Omnipod device. 900 each 3  . lisinopril (PRINIVIL,ZESTRIL) 20 MG tablet Take 1 tablet (20 mg total) by mouth daily. 90 tablet 3  . omeprazole (PRILOSEC) 40 MG capsule Take 1 capsule (40 mg  total) by mouth daily. (Patient taking differently: Take 80 mg by mouth daily. ) 30 capsule 3  . potassium chloride SA (K-DUR,KLOR-CON) 20 MEQ tablet Take 1 tablet (20 mEq total) by mouth daily. 90 tablet 3  . divalproex (DEPAKOTE ER) 250 MG 24 hr tablet Take 3 tablets (750 mg total) by mouth daily for 30 days. 90 tablet 0  . FLUoxetine (PROZAC) 20 MG capsule Take 4 capsules (80 mg total) by mouth daily for 30 days. 120 capsule 0   No current facility-administered medications on file prior to visit.     Allergies  Allergen Reactions  . Orange Fruit Anaphylaxis  . Penicillins Anaphylaxis  . Jardiance [Empagliflozin] Itching    Family History  Problem Relation Age of Onset  . Diabetes Mother   . Coronary artery disease Mother 56  . Gout Mother   . Heart disease Mother   . Heart attack Mother 48  . Diabetes Father   . Coronary artery disease Father 75       Died in a house fire   . Heart disease Father   . Heart disease Brother     There were no vitals taken for this visit.  Review of Systems He denies hypoglycemia    Objective:   Physical Exam   Lab Results  Component Value Date   CREATININE 1.53 (H) 03/23/2018   BUN 18 03/23/2018   NA 139 03/23/2018   K 4.9 03/23/2018   CL 103 03/23/2018   CO2 24 03/23/2018       Assessment & Plan:  Insulin-requiring type 2 DM: uncertain glycemic control.  This is a suboptimal regimen, but I need a1c in order to adjust.  He declines for now Renal insuff: in this setting, he will need most of his daily insulin in the form of fast-acting mealtime doses.   Patient Instructions  check your blood sugar 3  times a day: cary the time of day, between before the 3 meals, and at bedtime.  also check if you have symptoms of your blood sugar being too high or too low.  please keep a record of the readings and bring it to your next appointment here (or you can bring the meter itself).  You can write it on any piece of paper.  please call us sooner if your blood sugar goes below 70, or if you have a lot of readings over 200. Please take the same insulins.   Please come back for a follow-up appointment in 6 weeks.

## 2018-07-02 NOTE — Patient Instructions (Addendum)
check your blood sugar 3 times a day: cary the time of day, between before the 3 meals, and at bedtime.  also check if you have symptoms of your blood sugar being too high or too low.  please keep a record of the readings and bring it to your next appointment here (or you can bring the meter itself).  You can write it on any piece of paper.  please call us sooner if your blood sugar goes below 70, or if you have a lot of readings over 200. Please take the same insulins.   Please come back for a follow-up appointment in 6 weeks.

## 2018-07-13 ENCOUNTER — Other Ambulatory Visit: Payer: Self-pay

## 2018-07-13 ENCOUNTER — Ambulatory Visit (INDEPENDENT_AMBULATORY_CARE_PROVIDER_SITE_OTHER): Payer: BC Managed Care – PPO | Admitting: Psychiatry

## 2018-07-13 DIAGNOSIS — F411 Generalized anxiety disorder: Secondary | ICD-10-CM

## 2018-07-13 DIAGNOSIS — F3181 Bipolar II disorder: Secondary | ICD-10-CM

## 2018-07-13 MED ORDER — BUSPIRONE HCL 10 MG PO TABS: 10.0000 mg | ORAL_TABLET | Freq: Three times a day (TID) | ORAL | 2 refills | Status: AC

## 2018-07-13 MED ORDER — DIVALPROEX SODIUM ER 500 MG PO TB24: 500.0000 mg | ORAL_TABLET | Freq: Every day | ORAL | 0 refills | Status: DC

## 2018-07-13 MED ORDER — FLUOXETINE HCL 20 MG PO CAPS: 80.0000 mg | ORAL_CAPSULE | Freq: Every day | ORAL | 2 refills | Status: DC

## 2018-07-13 MED ORDER — HYDROXYZINE PAMOATE 50 MG PO CAPS: 50.0000 mg | ORAL_CAPSULE | Freq: Three times a day (TID) | ORAL | 2 refills | Status: AC | PRN

## 2018-07-13 NOTE — Progress Notes (Signed)
[place] MD/PA/NP OP Progress Note  07/13/2018 10:49 AM Corey Palmer  MRN:  [id number] Interview was conducted by phone and I verified that I was speaking with the correct person using two identifiers. I discussed the limitations of evaluation and management by telemedicine and  the availability of in person appointments. Patient expressed understanding and agreed to proceed.  Chief Complaint: Anxiety.  HPI: 48 yo married male with bipolar 2 disorder and generalized/panic type anxiety presents requesting refills on his medications as he has run out of them (with exception of Depakote) 3 weeks ago. He missed his follow up appointment because he caught COVID-19 in March at work. He has recovered fully and is back at work (as a Dealer). He felt too sedated in AM taking Depakote ER 750 mg and started taking 500 mg. On this dose he has no problems with sedation and his mood remains stable. He has done well on a combination of Depakote, Prozac, Buspar and hydroxyzine.   Visit Diagnosis:    ICD-10-CM   1. Bipolar 2 disorder (HCC)  F31.81   2. GAD (generalized anxiety disorder)  F41.1     Past Psychiatric History: Please see intake H&P.  Past Medical History:  Past Medical History:  Diagnosis Date  . Anginal pain ([place])   . Anxiety   . Arthritis   . Automatic implantable cardioverter-defibrillator in situ    Medtronic   . Bipolar disorder ([place])   . CAD (coronary artery disease)    Cardiac cath (08/19/2000) - Nonobstructive, 40% stenosis of LAD,.   . Cancer [facility]) 2013   benign stomach cancer  . CHF (congestive heart failure) ([place])   . Chondrocalcinosis of right knee 06/11/2013  . Chronic systolic heart failure ([place])    2D echo (02/5850) - Systolic function severely reduced., LV EF 20-25%. Akinesis of apical and anteroseptal mycoardium.  last 2D echo (11/2008 ), LV EF 25-30%, diffuse hypokinesis, and grade 1 diastolic dysfunction.  . Depression   . Diabetes uncomplicated adult-type II    on  insulin therapy, a1c 12.9 in 01/2011  . Dilated cardiomyopathy ([place])    status post ICD placement in 2008 c/b tearing at the atrial junction resulting in tamponade and urgent thoracotomy  . Dyslipidemia   . Dysrhythmia   . GERD (gastroesophageal reflux disease)   . Gout, unspecified   . Hepatic steatosis 2011   seen on ultrasound.   . History of kidney stones   . Hx of echocardiogram    Echo (04/2013):  Mild LVH, EF 20-25%, mild LAE, mild to mod RVE with mild reduced RVSF.  Marland Kitchen Hypertension   . ICD (implantable cardiac defibrillator) in place 2007  . Myocardial infarction ([place]) 2008  . Obesity (BMI 30.0-34.9)   . Pacemaker   . Paroxysmal atrial fibrillation (HCC)    on chronic coumadin, goal INR 2-3. status post direct current  . Peptic ulcer   . Shortness of breath   . Sleep apnea    does not have CPAP    Past Surgical History:  Procedure Laterality Date  . CARDIAC CATHETERIZATION    . CARDIAC PACEMAKER PLACEMENT    . CHOLECYSTECTOMY    . COLONOSCOPY    . CORONARY ARTERY BYPASS GRAFT    . ESOPHAGOGASTRODUODENOSCOPY  04/05/2011   Procedure: ESOPHAGOGASTRODUODENOSCOPY (EGD);  Surgeon: Irene Shipper, MD;  Location: [facility] ENDOSCOPY;  Service: Endoscopy;  Laterality: N/A;  . JOINT REPLACEMENT     left knee replacement  . KNEE ARTHROSCOPY Right 06/11/2013  Procedure: RIGHT KNEE ARTHROSCOPY with chondroplasty;  Surgeon: Johnny Bridge, MD;  Location: [facility];  Service: Orthopedics;  Laterality: Right;  . TOTAL KNEE ARTHROPLASTY Left     Family Psychiatric History: Reviewed.  Family History:  Family History  Problem Relation Age of Onset  . Diabetes Mother   . Coronary artery disease Mother 41  . Gout Mother   . Heart disease Mother   . Heart attack Mother 57  . Diabetes Father   . Coronary artery disease Father 67       Died in a house fire   . Heart disease Father   . Heart disease Brother     Social History:  Social History   Socioeconomic History  . Marital status:  Married    Spouse name: Not on file  . Number of children: 3  . Years of education: college  . Highest education level: Not on file  Occupational History  . Occupation: bar tender  Social Needs  . Financial resource strain: Not on file  . Food insecurity    Worry: Not on file    Inability: Not on file  . Transportation needs    Medical: Not on file    Non-medical: Not on file  Tobacco Use  . Smoking status: Never Smoker  . Smokeless tobacco: Never Used  Substance and Sexual Activity  . Alcohol use: No  . Drug use: No  . Sexual activity: Yes  Lifestyle  . Physical activity    Days per week: Not on file    Minutes per session: Not on file  . Stress: Not on file  Relationships  . Social Herbalist on phone: Not on file    Gets together: Not on file    Attends religious service: Not on file    Active member of club or organization: Not on file    Attends meetings of clubs or organizations: Not on file    Relationship status: Not on file  Other Topics Concern  . Not on file  Social History Narrative   Lives in [place].   Studied sports medicine in college, got a BS - used to work with the [employer].          Allergies:  Allergies  Allergen Reactions  . Orange Fruit Anaphylaxis  . Penicillins Anaphylaxis  . Jardiance [Empagliflozin] Itching    Metabolic Disorder Labs: Lab Results  Component Value Date   HGBA1C 10.4 (A) 02/03/2018   MPG 323.53 08/08/2017   MPG 295 (H) 05/13/2013   No results found for: PROLACTIN Lab Results  Component Value Date   CHOL 183 03/29/2013   TRIG 391.0 (H) 03/29/2013   HDL 31.30 (L) 03/29/2013   CHOLHDL 6 03/29/2013   VLDL 78.2 (H) 03/29/2013   LDLCALC 74 03/29/2013   LDLCALC 130 (H) 03/04/2011   Lab Results  Component Value Date   TSH 2.489 10/15/2011   TSH 6.144 (H) 07/17/2011    Therapeutic Level Labs: No results found for: LITHIUM Lab Results  Component Value Date   VALPROATE 24 (L) 03/23/2018   No  components found for:  CBMZ  Current Medications: Current Outpatient Medications  Medication Sig Dispense Refill  . apixaban (ELIQUIS) 5 MG TABS tablet Take 1 tablet (5 mg total) by mouth 2 (two) times daily. 180 tablet 3  . atorvastatin (LIPITOR) 80 MG tablet Take 1 tablet (80 mg total) by mouth daily. 90 tablet 3  . busPIRone (BUSPAR) 10 MG tablet Take  1 tablet (10 mg total) by mouth 3 (three) times daily. 90 tablet 2  . carvedilol (COREG) 25 MG tablet Take 0.5 tablets (12.5 mg total) by mouth 2 (two) times daily. 180 tablet 3  . digoxin (LANOXIN) 0.125 MG tablet Take 1 tablet (0.125 mg total) by mouth daily. 90 tablet 3  . divalproex (DEPAKOTE ER) 500 MG 24 hr tablet Take 1 tablet (500 mg total) by mouth daily. 90 tablet 0  . Dulaglutide (TRULICITY) 1.5 HD/6.2IW SOPN Inject 1.5 mg into the skin once a week. 12 pen 3  . fenofibrate (TRICOR) 145 MG tablet Take 145 mg by mouth daily.    Marland Kitchen FLUoxetine (PROZAC) 20 MG capsule Take 4 capsules (80 mg total) by mouth daily. 120 capsule 2  . furosemide (LASIX) 20 MG tablet Take 3 tablets (60 mg total) by mouth daily. 270 tablet 3  . glucose blood (FREESTYLE PRECISION NEO TEST) test strip 1 strip and 1 lancet, each 10 per day.  Uses Omnipod device. 900 each 3  . hydrOXYzine (VISTARIL) 50 MG capsule Take 1 capsule (50 mg total) by mouth 3 (three) times daily as needed for anxiety. 90 capsule 2  . Insulin Glargine (LANTUS SOLOSTAR) 100 UNIT/ML Solostar Pen Inject 50 Units into the skin every morning. 10 pen PRN  . lisinopril (PRINIVIL,ZESTRIL) 20 MG tablet Take 1 tablet (20 mg total) by mouth daily. 90 tablet 3  . omeprazole (PRILOSEC) 40 MG capsule Take 1 capsule (40 mg total) by mouth daily. (Patient taking differently: Take 80 mg by mouth daily. ) 30 capsule 3  . potassium chloride SA (K-DUR,KLOR-CON) 20 MEQ tablet Take 1 tablet (20 mEq total) by mouth daily. 90 tablet 3   No current facility-administered medications for this visit.     Psychiatric  Specialty Exam: Review of Systems  Musculoskeletal: Positive for joint pain.  Psychiatric/Behavioral: The patient is nervous/anxious.   All other systems reviewed and are negative.   There were no vitals taken for this visit.There is no height or weight on file to calculate BMI.  General Appearance: NA  Eye Contact:  NA  Speech:  Clear and Coherent  Volume:  Normal  Mood:  Anxious  Affect:  NA  Thought Process:  Goal Directed  Orientation:  Full (Time, Place, and Person)  Thought Content: Logical   Suicidal Thoughts:  No  Homicidal Thoughts:  No  Memory:  Immediate;   Good Recent;   Good Remote;   Good  Judgement:  Good  Insight:  Good  Psychomotor Activity:  NA  Concentration:  Concentration: Good  Recall:  Good  Fund of Knowledge: Good  Language: Good  Akathisia:  Negative  Handed:  Right  AIMS (if indicated): not done  Assets:  Communication Skills Desire for Improvement Housing Leisure Time Resilience Social Support  ADL's:  Intact  Cognition: WNL  Sleep:  Good   Screenings: PHQ2-9     Office Visit from 03/04/2011 in [place] Office Visit from 01/07/2011 in [facility] Office Visit from 08/03/2010 in [facility]  PHQ-2 Total Score  0  0  0       Assessment and Plan: 48 yo married male with bipolar 2 disorder and generalized/panic type anxiety presents requesting refills on his medications as he has run out of them (with exception of Depakote) 3 weeks ago. He missed his follow up appointment because he caught COVID-19 in March at work. He has recovered fully and  is back at work (as a Dealer). He felt too sedated in AM taking Depakote ER 750 mg and started taking 500 mg. On this dose he has no problems with sedation and his mood remains stable. He has done well on a combination of Depakote, Prozac, Buspar and hydroxyzine.  Plan: Renew Rx for Depakote at a 500 mg dose and restart Prozac 80 mg  daily (gradually increase dose by 20 mg every 4-5 days), Buspar 10 mg tid and hydroxyzine 50 mg tid prn anxiety. Next med-management visit in 2 months.    Stephanie Acre, MD 07/13/2018, 10:49 AM

## 2018-08-07 ENCOUNTER — Ambulatory Visit: Payer: BC Managed Care – PPO | Admitting: Endocrinology

## 2018-08-21 ENCOUNTER — Ambulatory Visit (INDEPENDENT_AMBULATORY_CARE_PROVIDER_SITE_OTHER): Payer: BC Managed Care – PPO | Admitting: Endocrinology

## 2018-08-21 ENCOUNTER — Encounter: Payer: Self-pay | Admitting: Endocrinology

## 2018-08-21 ENCOUNTER — Other Ambulatory Visit: Payer: Self-pay

## 2018-08-21 DIAGNOSIS — E1165 Type 2 diabetes mellitus with hyperglycemia: Secondary | ICD-10-CM

## 2018-08-21 NOTE — Patient Instructions (Addendum)
check your blood sugar 3 times a day: cary the time of day, between before the 3 meals, and at bedtime.  also check if you have symptoms of your blood sugar being too high or too low.  please keep a record of the readings and bring it to your next appointment here (or you can bring the meter itself).  You can write it on any piece of paper.  please call us sooner if your blood sugar goes below 70, or if you have a lot of readings over 200. When you hear that your coronavirus test is negative, please come in to have the A1c checked.  Please call ahead Please come back for a follow-up appointment in 6 weeks.

## 2018-08-21 NOTE — Progress Notes (Signed)
Subjective:    Patient ID: Corey Palmer, male    DOB: Apr 17, 1970, 48 y.o.   MRN: [id number]  HPI Pt returns for f/u of diabetes mellitus:  DM type: Insulin-requiring type 2.  Dx'ed: 7628 Complications: polyneuropathy, CAD, and renal insuff.   Therapy: insulin since 3151, and trulicity.   DKA: never Severe hypoglycemia: once (2015) Pancreatitis: never Pancreatic imaging: normal on 2013 CT Other: ins declines farxiga; he declines continuous glucose monitor, due to cost; he works as heavy Office manager; he stopped Omnipod, due to cost; he takes QD insulin, after poor results with multiple daily injections.   Interval history: he seldom has hypoglycemia, and these episodes are mild.  This happens with activity.  Pt says cbg varies from 85-150.   Past Medical History:  Diagnosis Date  . Anginal pain ([place])   . Anxiety   . Arthritis   . Automatic implantable cardioverter-defibrillator in situ    Medtronic   . Bipolar disorder ([place])   . CAD (coronary artery disease)    Cardiac cath (08/19/2000) - Nonobstructive, 40% stenosis of LAD,.   . Cancer [facility]) 2013   benign stomach cancer  . CHF (congestive heart failure) ([place])   . Chondrocalcinosis of right knee 06/11/2013  . Chronic systolic heart failure ([place])    2D echo (76/1607) - Systolic function severely reduced., LV EF 20-25%. Akinesis of apical and anteroseptal mycoardium.  last 2D echo (11/2008 ), LV EF 25-30%, diffuse hypokinesis, and grade 1 diastolic dysfunction.  . Depression   . Diabetes uncomplicated adult-type II    on insulin therapy, a1c 12.9 in 01/2011  . Dilated cardiomyopathy ([place])    status post ICD placement in 2008 c/b tearing at the atrial junction resulting in tamponade and urgent thoracotomy  . Dyslipidemia   . Dysrhythmia   . GERD (gastroesophageal reflux disease)   . Gout, unspecified   . Hepatic steatosis 2011   seen on ultrasound.   . History of kidney stones   . Hx of echocardiogram    Echo (04/2013):   Mild LVH, EF 20-25%, mild LAE, mild to mod RVE with mild reduced RVSF.  Marland Kitchen Hypertension   . ICD (implantable cardiac defibrillator) in place 2007  . Myocardial infarction ([place]) 2008  . Obesity (BMI 30.0-34.9)   . Pacemaker   . Paroxysmal atrial fibrillation (HCC)    on chronic coumadin, goal INR 2-3. status post direct current  . Peptic ulcer   . Shortness of breath   . Sleep apnea    does not have CPAP    Past Surgical History:  Procedure Laterality Date  . CARDIAC CATHETERIZATION    . CARDIAC PACEMAKER PLACEMENT    . CHOLECYSTECTOMY    . COLONOSCOPY    . CORONARY ARTERY BYPASS GRAFT    . ESOPHAGOGASTRODUODENOSCOPY  04/05/2011   Procedure: ESOPHAGOGASTRODUODENOSCOPY (EGD);  Surgeon: Irene Shipper, MD;  Location: [facility] ENDOSCOPY;  Service: Endoscopy;  Laterality: N/A;  . JOINT REPLACEMENT     left knee replacement  . KNEE ARTHROSCOPY Right 06/11/2013   Procedure: RIGHT KNEE ARTHROSCOPY with chondroplasty;  Surgeon: Johnny Bridge, MD;  Location: [facility];  Service: Orthopedics;  Laterality: Right;  . TOTAL KNEE ARTHROPLASTY Left     Social History   Socioeconomic History  . Marital status: Married    Spouse name: Not on file  . Number of children: 3  . Years of education: college  . Highest education level: Not on file  Occupational History  . Occupation:  bar tender  Social Needs  . Financial resource strain: Not on file  . Food insecurity    Worry: Not on file    Inability: Not on file  . Transportation needs    Medical: Not on file    Non-medical: Not on file  Tobacco Use  . Smoking status: Never Smoker  . Smokeless tobacco: Never Used  Substance and Sexual Activity  . Alcohol use: No  . Drug use: No  . Sexual activity: Yes  Lifestyle  . Physical activity    Days per week: Not on file    Minutes per session: Not on file  . Stress: Not on file  Relationships  . Social Herbalist on phone: Not on file    Gets together: Not on file    Attends religious  service: Not on file    Active member of club or organization: Not on file    Attends meetings of clubs or organizations: Not on file    Relationship status: Not on file  . Intimate partner violence    Fear of current or ex partner: Not on file    Emotionally abused: Not on file    Physically abused: Not on file    Forced sexual activity: Not on file  Other Topics Concern  . Not on file  Social History Narrative   Lives in [place].   Studied sports medicine in college, got a BS - used to work with the [employer].          Current Outpatient Medications on File Prior to Visit  Medication Sig Dispense Refill  . apixaban (ELIQUIS) 5 MG TABS tablet Take 1 tablet (5 mg total) by mouth 2 (two) times daily. 180 tablet 3  . atorvastatin (LIPITOR) 80 MG tablet Take 1 tablet (80 mg total) by mouth daily. 90 tablet 3  . busPIRone (BUSPAR) 10 MG tablet Take 1 tablet (10 mg total) by mouth 3 (three) times daily. 90 tablet 2  . carvedilol (COREG) 25 MG tablet Take 0.5 tablets (12.5 mg total) by mouth 2 (two) times daily. 180 tablet 3  . digoxin (LANOXIN) 0.125 MG tablet Take 1 tablet (0.125 mg total) by mouth daily. 90 tablet 3  . divalproex (DEPAKOTE ER) 500 MG 24 hr tablet Take 1 tablet (500 mg total) by mouth daily. 90 tablet 0  . Dulaglutide (TRULICITY) 1.5 VX/7.9TJ SOPN Inject 1.5 mg into the skin once a week. 12 pen 3  . fenofibrate (TRICOR) 145 MG tablet Take 145 mg by mouth daily.    Marland Kitchen FLUoxetine (PROZAC) 20 MG capsule Take 4 capsules (80 mg total) by mouth daily. 120 capsule 2  . furosemide (LASIX) 20 MG tablet Take 3 tablets (60 mg total) by mouth daily. 270 tablet 3  . glucose blood (FREESTYLE PRECISION NEO TEST) test strip 1 strip and 1 lancet, each 10 per day.  Uses Omnipod device. 900 each 3  . hydrOXYzine (VISTARIL) 50 MG capsule Take 1 capsule (50 mg total) by mouth 3 (three) times daily as needed for anxiety. 90 capsule 2  . Insulin Glargine (LANTUS SOLOSTAR) 100 UNIT/ML Solostar  Pen Inject 50 Units into the skin every morning. 10 pen PRN  . lisinopril (PRINIVIL,ZESTRIL) 20 MG tablet Take 1 tablet (20 mg total) by mouth daily. 90 tablet 3  . omeprazole (PRILOSEC) 40 MG capsule Take 1 capsule (40 mg total) by mouth daily. (Patient taking differently: Take 80 mg by mouth daily. ) 30 capsule 3  .  potassium chloride SA (K-DUR,KLOR-CON) 20 MEQ tablet Take 1 tablet (20 mEq total) by mouth daily. 90 tablet 3   No current facility-administered medications on file prior to visit.     Allergies  Allergen Reactions  . Orange Fruit Anaphylaxis  . Penicillins Anaphylaxis  . Jardiance [Empagliflozin] Itching    Family History  Problem Relation Age of Onset  . Diabetes Mother   . Coronary artery disease Mother 54  . Gout Mother   . Heart disease Mother   . Heart attack Mother 68  . Diabetes Father   . Coronary artery disease Father 65       Died in a house fire   . Heart disease Father   . Heart disease Brother     There were no vitals taken for this visit.   Review of Systems Denies denies hypoglycemia    Objective:   Physical Exam   Lab Results  Component Value Date   CREATININE 1.53 (H) 03/23/2018   BUN 18 03/23/2018   NA 139 03/23/2018   K 4.9 03/23/2018   CL 103 03/23/2018   CO2 24 03/23/2018      Assessment & Plan:  Insulin-requiring type 2 DM, with renal insuff: apparently well-controlled.   Patient Instructions  check your blood sugar 3 times a day: cary the time of day, between before the 3 meals, and at bedtime.  also check if you have symptoms of your blood sugar being too high or too low.  please keep a record of the readings and bring it to your next appointment here (or you can bring the meter itself).  You can write it on any piece of paper.  please call us sooner if your blood sugar goes below 70, or if you have a lot of readings over 200. When you hear that your coronavirus test is negative, please come in to have the A1c checked.   Please call ahead Please come back for a follow-up appointment in 6 weeks.

## 2018-08-29 DIAGNOSIS — E1169 Type 2 diabetes mellitus with other specified complication: Secondary | ICD-10-CM | POA: Diagnosis not present

## 2018-08-29 DIAGNOSIS — I5022 Chronic systolic (congestive) heart failure: Secondary | ICD-10-CM | POA: Diagnosis not present

## 2018-08-29 DIAGNOSIS — Z7901 Long term (current) use of anticoagulants: Secondary | ICD-10-CM | POA: Diagnosis not present

## 2018-08-29 DIAGNOSIS — R0602 Shortness of breath: Secondary | ICD-10-CM | POA: Diagnosis not present

## 2018-08-29 DIAGNOSIS — Z9049 Acquired absence of other specified parts of digestive tract: Secondary | ICD-10-CM | POA: Diagnosis not present

## 2018-08-29 DIAGNOSIS — I447 Left bundle-branch block, unspecified: Secondary | ICD-10-CM | POA: Diagnosis not present

## 2018-08-29 DIAGNOSIS — I110 Hypertensive heart disease with heart failure: Secondary | ICD-10-CM | POA: Diagnosis not present

## 2018-08-29 DIAGNOSIS — K219 Gastro-esophageal reflux disease without esophagitis: Secondary | ICD-10-CM | POA: Diagnosis not present

## 2018-08-29 DIAGNOSIS — I420 Dilated cardiomyopathy: Secondary | ICD-10-CM | POA: Diagnosis not present

## 2018-08-29 DIAGNOSIS — R7989 Other specified abnormal findings of blood chemistry: Secondary | ICD-10-CM | POA: Diagnosis not present

## 2018-08-29 DIAGNOSIS — I480 Paroxysmal atrial fibrillation: Secondary | ICD-10-CM | POA: Diagnosis not present

## 2018-08-29 DIAGNOSIS — I2510 Atherosclerotic heart disease of native coronary artery without angina pectoris: Secondary | ICD-10-CM | POA: Diagnosis not present

## 2018-08-29 DIAGNOSIS — R000 Tachycardia, unspecified: Secondary | ICD-10-CM | POA: Diagnosis not present

## 2018-08-29 DIAGNOSIS — I5023 Acute on chronic systolic (congestive) heart failure: Secondary | ICD-10-CM | POA: Diagnosis not present

## 2018-08-29 DIAGNOSIS — Z95810 Presence of automatic (implantable) cardiac defibrillator: Secondary | ICD-10-CM | POA: Diagnosis not present

## 2018-08-29 DIAGNOSIS — F419 Anxiety disorder, unspecified: Secondary | ICD-10-CM | POA: Diagnosis not present

## 2018-08-29 DIAGNOSIS — R1011 Right upper quadrant pain: Secondary | ICD-10-CM | POA: Diagnosis not present

## 2018-08-29 DIAGNOSIS — F329 Major depressive disorder, single episode, unspecified: Secondary | ICD-10-CM | POA: Diagnosis not present

## 2018-08-30 DIAGNOSIS — I5022 Chronic systolic (congestive) heart failure: Secondary | ICD-10-CM | POA: Diagnosis not present

## 2018-08-30 DIAGNOSIS — R1011 Right upper quadrant pain: Secondary | ICD-10-CM | POA: Diagnosis not present

## 2018-08-30 DIAGNOSIS — I110 Hypertensive heart disease with heart failure: Secondary | ICD-10-CM | POA: Diagnosis not present

## 2018-08-30 DIAGNOSIS — I420 Dilated cardiomyopathy: Secondary | ICD-10-CM | POA: Diagnosis not present

## 2018-08-31 DIAGNOSIS — R0600 Dyspnea, unspecified: Secondary | ICD-10-CM | POA: Diagnosis not present

## 2018-08-31 DIAGNOSIS — E876 Hypokalemia: Secondary | ICD-10-CM | POA: Diagnosis not present

## 2018-08-31 DIAGNOSIS — I5023 Acute on chronic systolic (congestive) heart failure: Secondary | ICD-10-CM | POA: Diagnosis not present

## 2018-08-31 DIAGNOSIS — R1011 Right upper quadrant pain: Secondary | ICD-10-CM | POA: Diagnosis not present

## 2018-08-31 DIAGNOSIS — I110 Hypertensive heart disease with heart failure: Secondary | ICD-10-CM | POA: Diagnosis not present

## 2018-09-06 DIAGNOSIS — I4589 Other specified conduction disorders: Secondary | ICD-10-CM | POA: Diagnosis not present

## 2018-09-06 DIAGNOSIS — I491 Atrial premature depolarization: Secondary | ICD-10-CM | POA: Diagnosis not present

## 2018-09-06 DIAGNOSIS — R000 Tachycardia, unspecified: Secondary | ICD-10-CM | POA: Diagnosis not present

## 2018-09-06 DIAGNOSIS — I447 Left bundle-branch block, unspecified: Secondary | ICD-10-CM | POA: Diagnosis not present

## 2018-09-08 ENCOUNTER — Telehealth: Payer: Self-pay | Admitting: Student

## 2018-09-08 ENCOUNTER — Other Ambulatory Visit: Payer: Self-pay

## 2018-09-08 ENCOUNTER — Ambulatory Visit (INDEPENDENT_AMBULATORY_CARE_PROVIDER_SITE_OTHER): Payer: BC Managed Care – PPO | Admitting: Student

## 2018-09-08 VITALS — BP 138/84 | HR 96 | Ht 75.0 in | Wt 252.0 lb

## 2018-09-08 DIAGNOSIS — Z20828 Contact with and (suspected) exposure to other viral communicable diseases: Secondary | ICD-10-CM | POA: Diagnosis not present

## 2018-09-08 DIAGNOSIS — I5022 Chronic systolic (congestive) heart failure: Secondary | ICD-10-CM | POA: Diagnosis not present

## 2018-09-08 LAB — CUP PACEART INCLINIC DEVICE CHECK
Battery Remaining Longevity: 84 mo
Brady Statistic RV Percent Paced: 0 %
Date Time Interrogation Session: 20200811105518
HighPow Impedance: 72 Ohm
Implantable Lead Implant Date: 20090817
Implantable Lead Location: 753860
Implantable Lead Model: 6935
Implantable Pulse Generator Implant Date: 20180419
Lead Channel Impedance Value: 512.5 Ohm
Lead Channel Pacing Threshold Amplitude: 0.75 V
Lead Channel Pacing Threshold Amplitude: 0.75 V
Lead Channel Pacing Threshold Pulse Width: 0.5 ms
Lead Channel Pacing Threshold Pulse Width: 0.5 ms
Lead Channel Sensing Intrinsic Amplitude: 12 mV
Lead Channel Setting Pacing Amplitude: 2.5 V
Lead Channel Setting Pacing Pulse Width: 0.5 ms
Lead Channel Setting Sensing Sensitivity: 0.5 mV
Pulse Gen Serial Number: 7411315

## 2018-09-08 LAB — BASIC METABOLIC PANEL
BUN/Creatinine Ratio: 20 (ref 9–20)
BUN: 23 mg/dL (ref 6–24)
CO2: 23 mmol/L (ref 20–29)
Calcium: 9.9 mg/dL (ref 8.7–10.2)
Chloride: 96 mmol/L (ref 96–106)
Creatinine, Ser: 1.15 mg/dL (ref 0.76–1.27)
GFR calc Af Amer: 86 mL/min/{1.73_m2} (ref >59)
GFR calc non Af Amer: 74 mL/min/{1.73_m2} (ref >59)
Glucose: 280 mg/dL — ABNORMAL HIGH (ref 65–99)
Potassium: 4.2 mmol/L (ref 3.5–5.2)
Sodium: 138 mmol/L (ref 134–144)

## 2018-09-08 MED ORDER — CARVEDILOL 25 MG PO TABS: 25.0000 mg | ORAL_TABLET | Freq: Two times a day (BID) | ORAL | 3 refills | Status: DC

## 2018-09-08 NOTE — Telephone Encounter (Signed)
Pt states he has a monitor but has had it unplugged do to moving around. He is "stationary" now and will plug it in tonight. We scheduled him for remote for tomorrow. May need help troubleshooting his monitor with [employer].

## 2018-09-08 NOTE — Progress Notes (Signed)
Electrophysiology Office Note Date: 09/08/2018  ID:  Corey Palmer, DOB 1970/11/05, MRN [id number]  PCP: London Pepper, MD Primary Cardiologist: No primary care provider on file. Electrophysiologist: None  CC: Routine ICD follow-up  Corey Palmer is a 48 y.o. male seen today for Dr. Lovena Le. They present today for routine electrophysiology followup post recent hospitalization for CHF. He was admitted in [place] with "a lot of fluid". Lasix was adjusted. He states his EF was 15% (Down from 30-35%). He is currently feeling well. He denies with ADLs or carrying heavy objects currently in his job as a Engineer, building services. He is also renovating his home and hasn't had any difficulty since discharge. No ICD shocks or syncope. He feels occasionally tachy-palpitations, usually associated with exertion. He denies exertional chest pain, dyspnea, PND, orthopnea, nausea, vomiting, dizziness, syncope, edema, weight gain, or early satiety at this time.   Device History: St. Jude ICD implanted 2008 for Chronic systolic CHF due to NICM History of appropriate therapy: Yes History of AAD therapy: Yes   Past Medical History:  Diagnosis Date  . Anginal pain ([place])   . Anxiety   . Arthritis   . Automatic implantable cardioverter-defibrillator in situ    Medtronic   . Bipolar disorder ([place])   . CAD (coronary artery disease)    Cardiac cath (08/19/2000) - Nonobstructive, 40% stenosis of LAD,.   . Cancer [facility]) 2013   benign stomach cancer  . CHF (congestive heart failure) ([place])   . Chondrocalcinosis of right knee 06/11/2013  . Chronic systolic heart failure ([place])    2D echo (81/8563) - Systolic function severely reduced., LV EF 20-25%. Akinesis of apical and anteroseptal mycoardium.  last 2D echo (11/2008 ), LV EF 25-30%, diffuse hypokinesis, and grade 1 diastolic dysfunction.  . Depression   . Diabetes uncomplicated adult-type II    on insulin therapy, a1c 12.9 in 01/2011  . Dilated cardiomyopathy ([place])     status post ICD placement in 2008 c/b tearing at the atrial junction resulting in tamponade and urgent thoracotomy  . Dyslipidemia   . Dysrhythmia   . GERD (gastroesophageal reflux disease)   . Gout, unspecified   . Hepatic steatosis 2011   seen on ultrasound.   . History of kidney stones   . Hx of echocardiogram    Echo (04/2013):  Mild LVH, EF 20-25%, mild LAE, mild to mod RVE with mild reduced RVSF.  Marland Kitchen Hypertension   . ICD (implantable cardiac defibrillator) in place 2007  . Myocardial infarction ([place]) 2008  . Obesity (BMI 30.0-34.9)   . Pacemaker   . Paroxysmal atrial fibrillation ([facility])    on chronic coumadin, goal INR 2-3. status post direct current  . Peptic ulcer   . Shortness of breath   . Sleep apnea    does not have CPAP   Past Surgical History:  Procedure Laterality Date  . CARDIAC CATHETERIZATION    . CARDIAC PACEMAKER PLACEMENT    . CHOLECYSTECTOMY    . COLONOSCOPY    . CORONARY ARTERY BYPASS GRAFT    . ESOPHAGOGASTRODUODENOSCOPY  04/05/2011   Procedure: ESOPHAGOGASTRODUODENOSCOPY (EGD);  Surgeon: Irene Shipper, MD;  Location: [facility] ENDOSCOPY;  Service: Endoscopy;  Laterality: N/A;  . JOINT REPLACEMENT     left knee replacement  . KNEE ARTHROSCOPY Right 06/11/2013   Procedure: RIGHT KNEE ARTHROSCOPY with chondroplasty;  Surgeon: Johnny Bridge, MD;  Location: [facility];  Service: Orthopedics;  Laterality: Right;  . TOTAL KNEE ARTHROPLASTY Left  Current Outpatient Medications  Medication Sig Dispense Refill  . apixaban (ELIQUIS) 5 MG TABS tablet Take 1 tablet (5 mg total) by mouth 2 (two) times daily. 180 tablet 3  . atorvastatin (LIPITOR) 80 MG tablet Take 1 tablet (80 mg total) by mouth daily. 90 tablet 3  . busPIRone (BUSPAR) 10 MG tablet Take 1 tablet (10 mg total) by mouth 3 (three) times daily. 90 tablet 2  . carvedilol (COREG) 25 MG tablet Take 1 tablet (25 mg total) by mouth 2 (two) times daily. 180 tablet 3  . digoxin (LANOXIN) 0.125 MG tablet Take 1 tablet  (0.125 mg total) by mouth daily. 90 tablet 3  . divalproex (DEPAKOTE ER) 500 MG 24 hr tablet Take 1 tablet (500 mg total) by mouth daily. 90 tablet 0  . Dulaglutide (TRULICITY) 1.5 PZ/0.2HE SOPN Inject 1.5 mg into the skin once a week. 12 pen 3  . fenofibrate (TRICOR) 145 MG tablet Take 145 mg by mouth daily.    Marland Kitchen FLUoxetine (PROZAC) 20 MG capsule Take 4 capsules (80 mg total) by mouth daily. 120 capsule 2  . furosemide (LASIX) 40 MG tablet Take 40 mg by mouth 2 (two) times daily.    Marland Kitchen glucose blood (FREESTYLE PRECISION NEO TEST) test strip 1 strip and 1 lancet, each 10 per day.  Uses Omnipod device. 900 each 3  . hydrOXYzine (VISTARIL) 50 MG capsule Take 1 capsule (50 mg total) by mouth 3 (three) times daily as needed for anxiety. 90 capsule 2  . Insulin Glargine (LANTUS SOLOSTAR) 100 UNIT/ML Solostar Pen Inject 50 Units into the skin every morning. 10 pen PRN  . lisinopril (PRINIVIL,ZESTRIL) 20 MG tablet Take 1 tablet (20 mg total) by mouth daily. 90 tablet 3  . omeprazole (PRILOSEC) 40 MG capsule Take 1 capsule (40 mg total) by mouth daily. (Patient taking differently: Take 80 mg by mouth daily. ) 30 capsule 3  . potassium chloride SA (K-DUR,KLOR-CON) 20 MEQ tablet Take 1 tablet (20 mEq total) by mouth daily. 90 tablet 3   No current facility-administered medications for this visit.     Allergies:   Orange fruit, Penicillins, and Jardiance [empagliflozin]   Social History: Social History   Socioeconomic History  . Marital status: Married    Spouse name: Not on file  . Number of children: 3  . Years of education: college  . Highest education level: Not on file  Occupational History  . Occupation: bar tender  Social Needs  . Financial resource strain: Not on file  . Food insecurity    Worry: Not on file    Inability: Not on file  . Transportation needs    Medical: Not on file    Non-medical: Not on file  Tobacco Use  . Smoking status: Never Smoker  . Smokeless tobacco: Never  Used  Substance and Sexual Activity  . Alcohol use: No  . Drug use: No  . Sexual activity: Yes  Lifestyle  . Physical activity    Days per week: Not on file    Minutes per session: Not on file  . Stress: Not on file  Relationships  . Social Herbalist on phone: Not on file    Gets together: Not on file    Attends religious service: Not on file    Active member of club or organization: Not on file    Attends meetings of clubs or organizations: Not on file    Relationship status: Not on file  .  Intimate partner violence    Fear of current or ex partner: Not on file    Emotionally abused: Not on file    Physically abused: Not on file    Forced sexual activity: Not on file  Other Topics Concern  . Not on file  Social History Narrative   Lives in [place].   Studied sports medicine in college, got a BS - used to work with the [employer].          Family History: Family History  Problem Relation Age of Onset  . Diabetes Mother   . Coronary artery disease Mother 33  . Gout Mother   . Heart disease Mother   . Heart attack Mother 49  . Diabetes Father   . Coronary artery disease Father 63       Died in a house fire   . Heart disease Father   . Heart disease Brother     Review of Systems: All other systems reviewed and are otherwise negative except as noted above.   Physical Exam: Vitals:   09/08/18 0825  Weight: 252 lb (114.3 kg)  Height: 6\' 3"  (1.905 m)     GEN- The patient is well appearing, alert and oriented x 3 today.   HEENT: normocephalic, atraumatic; sclera clear, conjunctiva pink; hearing intact; oropharynx clear; neck supple, no JVP Lymph- no cervical lymphadenopathy Lungs- Clear to ausculation bilaterally, normal work of breathing.  No wheezes, rales, rhonchi Heart- Regular rate and rhythm, no murmurs, rubs or gallops, PMI not laterally displaced GI- soft, non-tender, non-distended, bowel sounds present, no hepatosplenomegaly Extremities- no  clubbing, cyanosis, or edema; DP/PT/radial pulses 2+ bilaterally MS- no significant deformity or atrophy Skin- warm and dry, no rash or lesion; ICD pocket well healed Psych- euthymic mood, full affect Neuro- strength and sensation are intact  ICD interrogation- reviewed in detail today,  See PACEART report  EKG:  EKG is ordered today. The ekg ordered today shows NSR at 96 bpm with evidence of LVH. QRS 116 ms.  Recent Labs: 03/23/2018: ALT 26; BUN 18; Creatinine, Ser 1.53; Hemoglobin 16.2; Platelets 251; Potassium 4.9; Sodium 139   Wt Readings from Last 3 Encounters:  09/08/18 252 lb (114.3 kg)  03/26/18 264 lb 6.4 oz (119.9 kg)  02/27/18 268 lb 9.6 oz (121.8 kg)     Other studies Reviewed: Additional studies/ records that were reviewed today include: previous echo and cath.    Assessment and Plan:  1.  Chronic systolic dysfunction due to NICM euvolemic today with recent med adjustments.  He reports recent echo with EF 15%. We have requested those records today, and will refer to the HF clinic.  Increase coreg back to 25 mg BID. Pt states he felt better on this dosing.  Continue lisinopril 20 mg daily. (Good candidate for Entresto, but our most recent labs show high normal K. Repeat today) Consider spiro pending transition to [facility] and labs NYHA I-II so no digoxin for now.  Continue lasix 40 mg BID. May need to adjust if transition to [facility]. Normal ICD function See Claudia Desanctis Art report No changes today 2. ICD  His St Jude device is working normally. See Paceart for full report. He has had episode of VT vs SVT (single chamber device). Increasing BB as above.  He has not been followed remotely. Will get him set back up.  3. HTN  Adjusting medications as above.   Current medicines are reviewed at length with the patient today.   The patient does  not have concerns regarding his medicines.  The following changes were made today:  Increase coreg. Labs to consider Entresto > spiro.   Labs/ tests ordered today include:  Orders Placed This Encounter  Procedures  . Basic metabolic panel  . AMB referral to CHF clinic  . EKG 12-Lead    Disposition:   Follow up with Dr. Lovena Le in 6 months due to long length of follow up, likely yearly after that.  Will refer to HF clinic for management of his CHF.  Will re-set up remote monitoring.   Jacalyn Lefevre, PA-C  09/08/2018 8:38 AM  [facility] [address] [place] [address] [phone] (office) [phone] (fax)

## 2018-09-08 NOTE — Patient Instructions (Addendum)
Medication Instructions:   START TAKING CARVEDILOL 25 MG TWICE A DAY   If you need a refill on your cardiac medications before your next appointment, please call your pharmacy.   Lab work: NONE ORDERED  TODAY   If you have labs (blood work) drawn today and your tests are completely normal, you will receive your results only by: Marland Kitchen MyChart Message (if you have MyChart) OR . A paper copy in the mail If you have any lab test that is abnormal or we need to change your treatment, we will call you to review the results.  Testing/Procedures:NONE ORDERED  TODAY   Follow-Up: You have been referred to Congestive Heart Failure Clinic   At [facility], you and your health needs are our priority.  As part of our continuing mission to provide you with exceptional heart care, we have created designated Provider Care Teams.  These Care Teams include your primary Cardiologist (physician) and Advanced Practice Providers (APPs -  Physician Assistants and Nurse Practitioners) who all work together to provide you with the care you need, when you need it. You will need a follow up appointment in 6 months.  Please call our office 2 months in advance to schedule this appointment.  You may see Dr. Lovena Le  or one of the following Advanced Practice Providers on your designated Care Team:   Chanetta Marshall, NP . Tommye Standard, PA-C  Any Other Special Instructions Will Be Listed Below (If Applicable).

## 2018-09-09 ENCOUNTER — Encounter: Payer: BC Managed Care – PPO | Admitting: *Deleted

## 2018-09-09 NOTE — Telephone Encounter (Signed)
The pt states his monitor does not work. He has tried both monitors. I tried to give him [employer] tech support number and he states he tried to get their help with the monitor. [employer] did not know why his monitor is not working. I told him I will let Jonni Sanger know.

## 2018-09-09 NOTE — Telephone Encounter (Signed)
   Per Merlin, monitor is up to date as of 09/09/18. Pt should be able to send a manual transmission at this time.  LMOVM requesting call back to [place], direct number given.

## 2018-09-10 NOTE — Telephone Encounter (Signed)
Spoke w/ pt and requested a manual transmission.

## 2018-09-11 ENCOUNTER — Ambulatory Visit (HOSPITAL_COMMUNITY): Payer: BC Managed Care – PPO | Admitting: Psychiatry

## 2018-09-11 ENCOUNTER — Other Ambulatory Visit: Payer: Self-pay

## 2018-09-15 NOTE — Telephone Encounter (Signed)
Spoke w/ pt and he stated that when he tries to send the transmission the monitor will start on the heart and then go to the tower and get stuck and the 5 orange lights will start to blink. Pt stated that the light on the cell adapter is blue. I informed pt that this indicates that the device is trying to update and when he gets home to look at the cell adapter and if it is green to try again. Pt verbalized understanding.

## 2018-09-17 NOTE — Telephone Encounter (Signed)
Called [employer] and requested that they call pt to help trouble shoot his monitor since he is not home during office hours. [employer] stated that they would give patient a call.  Called pt and informed him that [employer] will call him tonight. Pt verbalized understanding.

## 2018-09-18 ENCOUNTER — Ambulatory Visit (INDEPENDENT_AMBULATORY_CARE_PROVIDER_SITE_OTHER): Payer: BC Managed Care – PPO | Admitting: *Deleted

## 2018-09-18 DIAGNOSIS — I255 Ischemic cardiomyopathy: Secondary | ICD-10-CM | POA: Diagnosis not present

## 2018-09-19 DIAGNOSIS — M79622 Pain in left upper arm: Secondary | ICD-10-CM | POA: Diagnosis not present

## 2018-09-19 DIAGNOSIS — W1839XA Other fall on same level, initial encounter: Secondary | ICD-10-CM | POA: Diagnosis not present

## 2018-09-19 DIAGNOSIS — M25512 Pain in left shoulder: Secondary | ICD-10-CM | POA: Diagnosis not present

## 2018-09-19 DIAGNOSIS — Y999 Unspecified external cause status: Secondary | ICD-10-CM | POA: Diagnosis not present

## 2018-09-21 LAB — CUP PACEART REMOTE DEVICE CHECK
Date Time Interrogation Session: 20200824100331
Implantable Lead Implant Date: 20090817
Implantable Lead Location: 753860
Implantable Lead Model: 6935
Implantable Pulse Generator Implant Date: 20180419
Pulse Gen Serial Number: 7411315

## 2018-09-21 NOTE — Telephone Encounter (Signed)
Spoke with patient to advise that transmission has been received. Pt agrees to keep the monitor plugged in at his bedside. No further questions at this time.

## 2018-09-25 ENCOUNTER — Encounter: Payer: Self-pay | Admitting: Cardiology

## 2018-09-25 NOTE — Progress Notes (Signed)
Remote ICD transmission.   

## 2018-09-29 DIAGNOSIS — R112 Nausea with vomiting, unspecified: Secondary | ICD-10-CM | POA: Diagnosis not present

## 2018-09-29 DIAGNOSIS — R0602 Shortness of breath: Secondary | ICD-10-CM | POA: Diagnosis not present

## 2018-09-29 DIAGNOSIS — R109 Unspecified abdominal pain: Secondary | ICD-10-CM | POA: Diagnosis not present

## 2018-09-29 DIAGNOSIS — R197 Diarrhea, unspecified: Secondary | ICD-10-CM | POA: Diagnosis not present

## 2018-10-01 ENCOUNTER — Other Ambulatory Visit: Payer: Self-pay

## 2018-10-01 ENCOUNTER — Inpatient Hospital Stay (HOSPITAL_COMMUNITY)
Admission: EM | Admit: 2018-10-01 | Discharge: 2018-10-06 | DRG: 291 | Disposition: A | Discharge status: Home / Self Care | Payer: BC Managed Care – PPO | Attending: Internal Medicine | Admitting: Internal Medicine

## 2018-10-01 ENCOUNTER — Encounter (HOSPITAL_COMMUNITY): Payer: Self-pay | Admitting: Internal Medicine

## 2018-10-01 ENCOUNTER — Emergency Department (HOSPITAL_COMMUNITY): Payer: BC Managed Care – PPO

## 2018-10-01 DIAGNOSIS — R079 Chest pain, unspecified: Secondary | ICD-10-CM

## 2018-10-01 DIAGNOSIS — Z7901 Long term (current) use of anticoagulants: Secondary | ICD-10-CM | POA: Diagnosis not present

## 2018-10-01 DIAGNOSIS — F411 Generalized anxiety disorder: Secondary | ICD-10-CM | POA: Diagnosis not present

## 2018-10-01 DIAGNOSIS — G4733 Obstructive sleep apnea (adult) (pediatric): Secondary | ICD-10-CM | POA: Diagnosis present

## 2018-10-01 DIAGNOSIS — N179 Acute kidney failure, unspecified: Secondary | ICD-10-CM | POA: Diagnosis not present

## 2018-10-01 DIAGNOSIS — E669 Obesity, unspecified: Secondary | ICD-10-CM | POA: Diagnosis not present

## 2018-10-01 DIAGNOSIS — Z8711 Personal history of peptic ulcer disease: Secondary | ICD-10-CM

## 2018-10-01 DIAGNOSIS — Z95810 Presence of automatic (implantable) cardiac defibrillator: Secondary | ICD-10-CM

## 2018-10-01 DIAGNOSIS — I255 Ischemic cardiomyopathy: Secondary | ICD-10-CM | POA: Diagnosis not present

## 2018-10-01 DIAGNOSIS — I130 Hypertensive heart and chronic kidney disease with heart failure and stage 1 through stage 4 chronic kidney disease, or unspecified chronic kidney disease: Principal | ICD-10-CM | POA: Diagnosis present

## 2018-10-01 DIAGNOSIS — I110 Hypertensive heart disease with heart failure: Secondary | ICD-10-CM | POA: Diagnosis not present

## 2018-10-01 DIAGNOSIS — E1165 Type 2 diabetes mellitus with hyperglycemia: Secondary | ICD-10-CM | POA: Diagnosis not present

## 2018-10-01 DIAGNOSIS — E1122 Type 2 diabetes mellitus with diabetic chronic kidney disease: Secondary | ICD-10-CM | POA: Diagnosis not present

## 2018-10-01 DIAGNOSIS — N184 Chronic kidney disease, stage 4 (severe): Secondary | ICD-10-CM | POA: Diagnosis not present

## 2018-10-01 DIAGNOSIS — E785 Hyperlipidemia, unspecified: Secondary | ICD-10-CM | POA: Diagnosis not present

## 2018-10-01 DIAGNOSIS — Z6833 Body mass index (BMI) 33.0-33.9, adult: Secondary | ICD-10-CM | POA: Diagnosis not present

## 2018-10-01 DIAGNOSIS — Z9049 Acquired absence of other specified parts of digestive tract: Secondary | ICD-10-CM

## 2018-10-01 DIAGNOSIS — Z87442 Personal history of urinary calculi: Secondary | ICD-10-CM

## 2018-10-01 DIAGNOSIS — Z20828 Contact with and (suspected) exposure to other viral communicable diseases: Secondary | ICD-10-CM | POA: Diagnosis not present

## 2018-10-01 DIAGNOSIS — Z794 Long term (current) use of insulin: Secondary | ICD-10-CM

## 2018-10-01 DIAGNOSIS — I509 Heart failure, unspecified: Secondary | ICD-10-CM | POA: Insufficient documentation

## 2018-10-01 DIAGNOSIS — E781 Pure hyperglyceridemia: Secondary | ICD-10-CM | POA: Diagnosis present

## 2018-10-01 DIAGNOSIS — I420 Dilated cardiomyopathy: Secondary | ICD-10-CM | POA: Diagnosis present

## 2018-10-01 DIAGNOSIS — K219 Gastro-esophageal reflux disease without esophagitis: Secondary | ICD-10-CM | POA: Diagnosis present

## 2018-10-01 DIAGNOSIS — F419 Anxiety disorder, unspecified: Secondary | ICD-10-CM | POA: Diagnosis not present

## 2018-10-01 DIAGNOSIS — E876 Hypokalemia: Secondary | ICD-10-CM | POA: Diagnosis not present

## 2018-10-01 DIAGNOSIS — Z8249 Family history of ischemic heart disease and other diseases of the circulatory system: Secondary | ICD-10-CM

## 2018-10-01 DIAGNOSIS — Z91018 Allergy to other foods: Secondary | ICD-10-CM

## 2018-10-01 DIAGNOSIS — I480 Paroxysmal atrial fibrillation: Secondary | ICD-10-CM | POA: Diagnosis present

## 2018-10-01 DIAGNOSIS — Z880 Allergy status to penicillin: Secondary | ICD-10-CM

## 2018-10-01 DIAGNOSIS — I081 Rheumatic disorders of both mitral and tricuspid valves: Secondary | ICD-10-CM | POA: Diagnosis present

## 2018-10-01 DIAGNOSIS — Z96652 Presence of left artificial knee joint: Secondary | ICD-10-CM | POA: Diagnosis present

## 2018-10-01 DIAGNOSIS — I4891 Unspecified atrial fibrillation: Secondary | ICD-10-CM | POA: Diagnosis present

## 2018-10-01 DIAGNOSIS — Z79899 Other long term (current) drug therapy: Secondary | ICD-10-CM

## 2018-10-01 DIAGNOSIS — I5023 Acute on chronic systolic (congestive) heart failure: Secondary | ICD-10-CM

## 2018-10-01 DIAGNOSIS — Z888 Allergy status to other drugs, medicaments and biological substances status: Secondary | ICD-10-CM

## 2018-10-01 DIAGNOSIS — Z833 Family history of diabetes mellitus: Secondary | ICD-10-CM

## 2018-10-01 DIAGNOSIS — E1142 Type 2 diabetes mellitus with diabetic polyneuropathy: Secondary | ICD-10-CM | POA: Diagnosis not present

## 2018-10-01 DIAGNOSIS — K760 Fatty (change of) liver, not elsewhere classified: Secondary | ICD-10-CM | POA: Diagnosis present

## 2018-10-01 DIAGNOSIS — I252 Old myocardial infarction: Secondary | ICD-10-CM

## 2018-10-01 DIAGNOSIS — Z23 Encounter for immunization: Secondary | ICD-10-CM | POA: Diagnosis not present

## 2018-10-01 DIAGNOSIS — I493 Ventricular premature depolarization: Secondary | ICD-10-CM | POA: Diagnosis present

## 2018-10-01 DIAGNOSIS — R0602 Shortness of breath: Secondary | ICD-10-CM | POA: Diagnosis not present

## 2018-10-01 DIAGNOSIS — I10 Essential (primary) hypertension: Secondary | ICD-10-CM | POA: Diagnosis not present

## 2018-10-01 DIAGNOSIS — F4310 Post-traumatic stress disorder, unspecified: Secondary | ICD-10-CM | POA: Diagnosis present

## 2018-10-01 DIAGNOSIS — I272 Pulmonary hypertension, unspecified: Secondary | ICD-10-CM | POA: Diagnosis present

## 2018-10-01 DIAGNOSIS — I428 Other cardiomyopathies: Secondary | ICD-10-CM | POA: Diagnosis not present

## 2018-10-01 DIAGNOSIS — Z951 Presence of aortocoronary bypass graft: Secondary | ICD-10-CM

## 2018-10-01 DIAGNOSIS — Z6831 Body mass index (BMI) 31.0-31.9, adult: Secondary | ICD-10-CM | POA: Diagnosis not present

## 2018-10-01 DIAGNOSIS — I2510 Atherosclerotic heart disease of native coronary artery without angina pectoris: Secondary | ICD-10-CM | POA: Diagnosis present

## 2018-10-01 DIAGNOSIS — F319 Bipolar disorder, unspecified: Secondary | ICD-10-CM | POA: Diagnosis present

## 2018-10-01 LAB — URINALYSIS, ROUTINE W REFLEX MICROSCOPIC
Bacteria, UA: NONE SEEN
Bilirubin Urine: NEGATIVE
Glucose, UA: NEGATIVE mg/dL
Hgb urine dipstick: NEGATIVE
Ketones, ur: NEGATIVE mg/dL
Leukocytes,Ua: NEGATIVE
Nitrite: NEGATIVE
Protein, ur: 100 mg/dL — AB
Specific Gravity, Urine: 1.012 (ref 1.005–1.030)
pH: 5 (ref 5.0–8.0)

## 2018-10-01 LAB — TROPONIN I (HIGH SENSITIVITY)
Troponin I (High Sensitivity): 21 ng/L — ABNORMAL HIGH (ref <18)
Troponin I (High Sensitivity): 21 ng/L — ABNORMAL HIGH (ref <18)
Troponin I (High Sensitivity): 22 ng/L — ABNORMAL HIGH (ref <18)

## 2018-10-01 LAB — BASIC METABOLIC PANEL
Anion gap: 9 (ref 5–15)
BUN: 26 mg/dL — ABNORMAL HIGH (ref 6–20)
CO2: 25 mmol/L (ref 22–32)
Calcium: 8.5 mg/dL — ABNORMAL LOW (ref 8.9–10.3)
Chloride: 100 mmol/L (ref 98–111)
Creatinine, Ser: 1.47 mg/dL — ABNORMAL HIGH (ref 0.61–1.24)
GFR calc Af Amer: >60 mL/min (ref >60)
GFR calc non Af Amer: 55 mL/min — ABNORMAL LOW (ref >60)
Glucose, Bld: 261 mg/dL — ABNORMAL HIGH (ref 70–99)
Potassium: 4.2 mmol/L (ref 3.5–5.1)
Sodium: 134 mmol/L — ABNORMAL LOW (ref 135–145)

## 2018-10-01 LAB — CBC
HCT: 43 % (ref 39.0–52.0)
HCT: 45.2 % (ref 39.0–52.0)
Hemoglobin: 13.9 g/dL (ref 13.0–17.0)
Hemoglobin: 14.8 g/dL (ref 13.0–17.0)
MCH: 27.4 pg (ref 26.0–34.0)
MCH: 27.5 pg (ref 26.0–34.0)
MCHC: 32.3 g/dL (ref 30.0–36.0)
MCHC: 32.7 g/dL (ref 30.0–36.0)
MCV: 84.8 fL (ref 80.0–100.0)
MCV: 83.9 fL (ref 80.0–100.0)
Platelets: 219 10*3/uL (ref 150–400)
Platelets: 229 10*3/uL (ref 150–400)
RBC: 5.07 MIL/uL (ref 4.22–5.81)
RBC: 5.39 MIL/uL (ref 4.22–5.81)
RDW: 14.2 % (ref 11.5–15.5)
RDW: 14.5 % (ref 11.5–15.5)
WBC: 7.6 10*3/uL (ref 4.0–10.5)
WBC: 7.1 10*3/uL (ref 4.0–10.5)
nRBC: 0 % (ref 0.0–0.2)
nRBC: 0 % (ref 0.0–0.2)

## 2018-10-01 LAB — MAGNESIUM: Magnesium: 1.7 mg/dL (ref 1.7–2.4)

## 2018-10-01 LAB — CREATININE, SERUM
Creatinine, Ser: 1.55 mg/dL — ABNORMAL HIGH (ref 0.61–1.24)
GFR calc Af Amer: >60 mL/min (ref >60)
GFR calc non Af Amer: 52 mL/min — ABNORMAL LOW (ref >60)

## 2018-10-01 LAB — GLUCOSE, CAPILLARY
Glucose-Capillary: 230 mg/dL — ABNORMAL HIGH (ref 70–99)
Glucose-Capillary: 243 mg/dL — ABNORMAL HIGH (ref 70–99)

## 2018-10-01 LAB — BRAIN NATRIURETIC PEPTIDE: B Natriuretic Peptide: 659 pg/mL — ABNORMAL HIGH (ref 0.0–100.0)

## 2018-10-01 LAB — LIPID PANEL
Cholesterol: 176 mg/dL (ref 0–200)
HDL: 25 mg/dL — ABNORMAL LOW (ref >40)
LDL Cholesterol: 126 mg/dL — ABNORMAL HIGH (ref 0–99)
Total CHOL/HDL Ratio: 7 RATIO
Triglycerides: 126 mg/dL (ref <150)
VLDL: 25 mg/dL (ref 0–40)

## 2018-10-01 LAB — SARS CORONAVIRUS 2 (TAT 6-24 HRS): SARS Coronavirus 2: NEGATIVE

## 2018-10-01 LAB — HEMOGLOBIN A1C
Hgb A1c MFr Bld: 11.9 % — ABNORMAL HIGH (ref 4.8–5.6)
Mean Plasma Glucose: 294.83 mg/dL

## 2018-10-01 LAB — PHOSPHORUS: Phosphorus: 5 mg/dL — ABNORMAL HIGH (ref 2.5–4.6)

## 2018-10-01 LAB — LIPASE, BLOOD: Lipase: 37 U/L (ref 11–51)

## 2018-10-01 LAB — TSH: TSH: 3.009 u[IU]/mL (ref 0.350–4.500)

## 2018-10-01 LAB — DIGOXIN LEVEL: Digoxin Level: 0.3 ng/mL — ABNORMAL LOW (ref 0.8–2.0)

## 2018-10-01 MED ORDER — INSULIN ASPART 100 UNIT/ML ~~LOC~~ SOLN
0.0000 [IU] | Freq: Three times a day (TID) | SUBCUTANEOUS | Status: DC
  Administered 2018-10-01 – 2018-10-02 (×3): 5 [IU] via SUBCUTANEOUS
  Administered 2018-10-03: 07:00:00 3 [IU] via SUBCUTANEOUS
  Administered 2018-10-03: 18:00:00 5 [IU] via SUBCUTANEOUS
  Administered 2018-10-04: 2 [IU] via SUBCUTANEOUS
  Administered 2018-10-04: 12:00:00 11 [IU] via SUBCUTANEOUS
  Administered 2018-10-05: 17:00:00 15 [IU] via SUBCUTANEOUS
  Administered 2018-10-05: 12:00:00 5 [IU] via SUBCUTANEOUS
  Administered 2018-10-05: 3 [IU] via SUBCUTANEOUS
  Administered 2018-10-06: 5 [IU] via SUBCUTANEOUS

## 2018-10-01 MED ORDER — ENOXAPARIN SODIUM 40 MG/0.4ML ~~LOC~~ SOLN: 40.0000 mg | SUBCUTANEOUS | Status: DC

## 2018-10-01 MED ORDER — DIGOXIN 125 MCG PO TABS
0.1250 mg | ORAL_TABLET | Freq: Every day | ORAL | Status: DC
  Administered 2018-10-02 – 2018-10-06 (×5): 0.125 mg via ORAL
  Filled 2018-10-01 (×5): qty 1

## 2018-10-01 MED ORDER — ASPIRIN 81 MG PO CHEW
324.0000 mg | CHEWABLE_TABLET | Freq: Once | ORAL | Status: AC
  Administered 2018-10-01: 324 mg via ORAL
  Filled 2018-10-01: qty 4

## 2018-10-01 MED ORDER — ASPIRIN EC 81 MG PO TBEC
81.0000 mg | DELAYED_RELEASE_TABLET | Freq: Every day | ORAL | Status: DC
  Administered 2018-10-02 – 2018-10-06 (×5): 81 mg via ORAL
  Filled 2018-10-01 (×5): qty 1

## 2018-10-01 MED ORDER — POTASSIUM CHLORIDE CRYS ER 10 MEQ PO TBCR
10.0000 meq | EXTENDED_RELEASE_TABLET | Freq: Every day | ORAL | Status: DC
  Administered 2018-10-01 – 2018-10-06 (×6): 10 meq via ORAL
  Filled 2018-10-01 (×6): qty 1

## 2018-10-01 MED ORDER — FUROSEMIDE 10 MG/ML IJ SOLN
40.0000 mg | Freq: Two times a day (BID) | INTRAMUSCULAR | Status: DC
  Administered 2018-10-01 – 2018-10-06 (×10): 40 mg via INTRAVENOUS
  Filled 2018-10-01 (×10): qty 4

## 2018-10-01 MED ORDER — ATORVASTATIN CALCIUM 80 MG PO TABS
80.0000 mg | ORAL_TABLET | Freq: Every day | ORAL | Status: DC
  Administered 2018-10-02 – 2018-10-05 (×4): 80 mg via ORAL
  Filled 2018-10-01 (×5): qty 1

## 2018-10-01 MED ORDER — BUSPIRONE HCL 10 MG PO TABS
10.0000 mg | ORAL_TABLET | Freq: Three times a day (TID) | ORAL | Status: DC
  Administered 2018-10-02 – 2018-10-06 (×13): 10 mg via ORAL
  Filled 2018-10-01 (×14): qty 1

## 2018-10-01 MED ORDER — FLUOXETINE HCL 20 MG PO CAPS
80.0000 mg | ORAL_CAPSULE | Freq: Every day | ORAL | Status: DC
  Administered 2018-10-02 – 2018-10-06 (×5): 80 mg via ORAL
  Filled 2018-10-01 (×5): qty 4

## 2018-10-01 MED ORDER — FENOFIBRATE 160 MG PO TABS
160.0000 mg | ORAL_TABLET | Freq: Every day | ORAL | Status: DC
  Administered 2018-10-02 – 2018-10-05 (×4): 160 mg via ORAL
  Filled 2018-10-01 (×5): qty 1

## 2018-10-01 MED ORDER — LISINOPRIL 20 MG PO TABS: 20.0000 mg | ORAL_TABLET | Freq: Every day | ORAL | Status: DC

## 2018-10-01 MED ORDER — HYDROMORPHONE HCL 1 MG/ML IJ SOLN
1.0000 mg | Freq: Once | INTRAMUSCULAR | Status: AC
  Administered 2018-10-01: 1 mg via INTRAVENOUS
  Filled 2018-10-01: qty 1

## 2018-10-01 MED ORDER — APIXABAN 5 MG PO TABS
5.0000 mg | ORAL_TABLET | Freq: Two times a day (BID) | ORAL | Status: DC
  Administered 2018-10-01 – 2018-10-06 (×10): 5 mg via ORAL
  Filled 2018-10-01 (×11): qty 1

## 2018-10-01 MED ORDER — SODIUM CHLORIDE 0.9% FLUSH
3.0000 mL | Freq: Two times a day (BID) | INTRAVENOUS | Status: DC
  Administered 2018-10-01 – 2018-10-06 (×7): 3 mL via INTRAVENOUS

## 2018-10-01 MED ORDER — ALUM & MAG HYDROXIDE-SIMETH 200-200-20 MG/5ML PO SUSP
30.0000 mL | Freq: Once | ORAL | Status: AC
  Administered 2018-10-01: 30 mL via ORAL
  Filled 2018-10-01: qty 30

## 2018-10-01 MED ORDER — MAGNESIUM OXIDE 400 (241.3 MG) MG PO TABS
400.0000 mg | ORAL_TABLET | Freq: Every day | ORAL | Status: DC
  Administered 2018-10-02 – 2018-10-06 (×5): 400 mg via ORAL
  Filled 2018-10-01 (×5): qty 1

## 2018-10-01 MED ORDER — SODIUM CHLORIDE 0.9% FLUSH
3.0000 mL | INTRAVENOUS | Status: DC | PRN
  Administered 2018-10-01: 18:00:00 3 mL via INTRAVENOUS
  Filled 2018-10-01: qty 3

## 2018-10-01 MED ORDER — SODIUM CHLORIDE 0.9 % IV SOLN: 250.0000 mL | INTRAVENOUS | Status: DC | PRN

## 2018-10-01 MED ORDER — PNEUMOCOCCAL VAC POLYVALENT 25 MCG/0.5ML IJ INJ
0.5000 mL | INJECTION | INTRAMUSCULAR | Status: AC
  Administered 2018-10-02: 0.5 mL via INTRAMUSCULAR
  Filled 2018-10-01: qty 0.5

## 2018-10-01 MED ORDER — ACETAMINOPHEN 325 MG PO TABS
650.0000 mg | ORAL_TABLET | ORAL | Status: DC | PRN
  Administered 2018-10-02 – 2018-10-06 (×2): 650 mg via ORAL
  Filled 2018-10-01 (×2): qty 2

## 2018-10-01 MED ORDER — LORAZEPAM 2 MG/ML IJ SOLN
1.0000 mg | Freq: Once | INTRAMUSCULAR | Status: AC
  Administered 2018-10-01: 1 mg via INTRAVENOUS
  Filled 2018-10-01: qty 1

## 2018-10-01 MED ORDER — INSULIN ASPART 100 UNIT/ML ~~LOC~~ SOLN
0.0000 [IU] | Freq: Every day | SUBCUTANEOUS | Status: DC
  Administered 2018-10-02: 2 [IU] via SUBCUTANEOUS
  Administered 2018-10-04: 21:00:00 3 [IU] via SUBCUTANEOUS

## 2018-10-01 MED ORDER — HYDROXYZINE PAMOATE 50 MG PO CAPS
50.0000 mg | ORAL_CAPSULE | Freq: Three times a day (TID) | ORAL | Status: DC | PRN
  Filled 2018-10-01: qty 1

## 2018-10-01 MED ORDER — ONDANSETRON HCL 4 MG/2ML IJ SOLN: 4.0000 mg | Freq: Four times a day (QID) | INTRAMUSCULAR | Status: DC | PRN

## 2018-10-01 MED ORDER — SODIUM CHLORIDE 0.9% FLUSH: 3.0000 mL | Freq: Once | INTRAVENOUS | Status: DC

## 2018-10-01 MED ORDER — DIVALPROEX SODIUM ER 500 MG PO TB24
500.0000 mg | ORAL_TABLET | Freq: Every day | ORAL | Status: DC
  Administered 2018-10-02 – 2018-10-06 (×5): 500 mg via ORAL
  Filled 2018-10-01 (×5): qty 1

## 2018-10-01 MED ORDER — INSULIN GLARGINE 100 UNIT/ML ~~LOC~~ SOLN
50.0000 [IU] | SUBCUTANEOUS | Status: DC
  Administered 2018-10-02 – 2018-10-06 (×5): 50 [IU] via SUBCUTANEOUS
  Filled 2018-10-01 (×6): qty 0.5

## 2018-10-01 MED ORDER — CARVEDILOL 25 MG PO TABS
25.0000 mg | ORAL_TABLET | Freq: Two times a day (BID) | ORAL | Status: DC
  Administered 2018-10-02 – 2018-10-06 (×10): 25 mg via ORAL
  Filled 2018-10-01 (×5): qty 1
  Filled 2018-10-01: qty 2
  Filled 2018-10-01: qty 1
  Filled 2018-10-01: qty 2
  Filled 2018-10-01 (×2): qty 1

## 2018-10-01 MED ORDER — PANTOPRAZOLE SODIUM 40 MG PO TBEC: 40.0000 mg | DELAYED_RELEASE_TABLET | Freq: Every day | ORAL | Status: DC

## 2018-10-01 MED ORDER — FUROSEMIDE 10 MG/ML IJ SOLN
40.0000 mg | Freq: Once | INTRAMUSCULAR | Status: AC
  Administered 2018-10-01: 40 mg via INTRAVENOUS
  Filled 2018-10-01: qty 4

## 2018-10-01 MED ORDER — LIDOCAINE VISCOUS HCL 2 % MT SOLN
15.0000 mL | Freq: Once | OROMUCOSAL | Status: AC
  Administered 2018-10-01: 15 mL via ORAL
  Filled 2018-10-01: qty 15

## 2018-10-01 MED ORDER — HYDROMORPHONE HCL 1 MG/ML IJ SOLN
1.0000 mg | Freq: Once | INTRAMUSCULAR | Status: AC
  Administered 2018-10-01: 13:00:00 1 mg via INTRAMUSCULAR
  Filled 2018-10-01: qty 1

## 2018-10-01 NOTE — H&P (Signed)
History and Physical    Corey Palmer TZG:[id number] DOB: 04-27-1970 DOA: 10/01/2018  PCP: London Pepper, MD  Patient coming from: Home I have personally briefly reviewed patient's old medical records in [place]  Chief Complaint: Shortness of breath and chest pain since 2 days  HPI: Corey Palmer is a very pleasant 48 y.o. male with medical history significant of systolic congestive heart failure with ejection fraction of 15% status post pacemaker since he was 48 year old, hypertension, hyperlipidemia, diabetes, obesity, anxiety, PTSD, A. fib presents to emergency department with worsening of shortness of breath and chest pain since 2 days.  Patient reports that he has central chest pain, nonradiating, 7 out of 10, pressure-like, no aggravating or relieving factors, associated with nausea, lightheadedness, dizziness, however denies association with vomiting, diaphoresis.  Reports shortness of breath, orthopnea, PND and worsening of leg swelling since 3 to 4 days.  Reports that he has gained about 8 pounds in last 4 days.  Reports been compliant with his medications, denies recent change in diet, smoking, alcohol, illicit drug use.  Reports that he had syncope on August 3-had transthoracic echo at [facility] which showed ejection fraction of 15%.  He sees cardiologist Dr. Crissie Sickles and his last appointment was about 1 year ago.  ED Course: Patient lying comfortably on the bed, maintaining oxygen saturation of 95% on room air, not in acute distress, vital stable, communicating well, received IV Lasix once.  Reports central chest pain however denies shortness of breath.  Review of Systems: As per HPI otherwise negative.    Past Medical History:  Diagnosis Date  . Anginal pain ([place])   . Anxiety   . Arthritis   . Automatic implantable cardioverter-defibrillator in situ 2007   Medtronic   . Bipolar disorder ([place])   . CAD (coronary artery disease)    Cardiac cath  (08/19/2000) - Nonobstructive, 40% stenosis of LAD,.   . Cancer [facility]) 2013   benign stomach cancer  . CHF (congestive heart failure) ([place])   . Chondrocalcinosis of right knee 06/11/2013  . Chronic systolic heart failure ([place])    2D echo (75/9163) - Systolic function severely reduced., LV EF 20-25%. Akinesis of apical and anteroseptal mycoardium.  last 2D echo (11/2008 ), LV EF 25-30%, diffuse hypokinesis, and grade 1 diastolic dysfunction.  . Depression   . Diabetes uncomplicated adult-type II    on insulin therapy, a1c 12.9 in 01/2011  . Dilated cardiomyopathy ([place])    status post ICD placement in 2008 c/b tearing at the atrial junction resulting in tamponade and urgent thoracotomy  . Dyslipidemia   . Dysrhythmia   . GERD (gastroesophageal reflux disease)   . Gout, unspecified   . Hepatic steatosis 2011   seen on ultrasound.   . History of kidney stones   . Hx of echocardiogram    Echo (04/2013):  Mild LVH, EF 20-25%, mild LAE, mild to mod RVE with mild reduced RVSF.  Marland Kitchen Hypertension   . Obesity (BMI 30.0-34.9)   . Pacemaker   . Paroxysmal atrial fibrillation (HCC)    on chronic coumadin, goal INR 2-3. status post direct current  . Peptic ulcer   . Shortness of breath   . Sleep apnea    does not have CPAP    Past Surgical History:  Procedure Laterality Date  . CARDIAC CATHETERIZATION    . CARDIAC PACEMAKER PLACEMENT    . CHOLECYSTECTOMY    . COLONOSCOPY    . CORONARY ARTERY  BYPASS GRAFT    . ESOPHAGOGASTRODUODENOSCOPY  04/05/2011   Procedure: ESOPHAGOGASTRODUODENOSCOPY (EGD);  Surgeon: Irene Shipper, MD;  Location: [facility] ENDOSCOPY;  Service: Endoscopy;  Laterality: N/A;  . JOINT REPLACEMENT     left knee replacement  . KNEE ARTHROSCOPY Right 06/11/2013   Procedure: RIGHT KNEE ARTHROSCOPY with chondroplasty;  Surgeon: Johnny Bridge, MD;  Location: [place];  Service: Orthopedics;  Laterality: Right;  . TOTAL KNEE ARTHROPLASTY Left      reports that he has never smoked. He has  never used smokeless tobacco. He reports that he does not drink alcohol or use drugs.  Allergies  Allergen Reactions  . Orange Fruit Anaphylaxis  . Penicillins Anaphylaxis    Did it involve swelling of the face/tongue/throat, SOB, or low BP? Yes Did it involve sudden or severe rash/hives, skin peeling, or any reaction on the inside of your mouth or nose? Yes Did you need to seek medical attention at a hospital or doctor's office? No When did it last happen?childhood If all above answers are "NO", may proceed with cephalosporin use.  [name] [Empagliflozin] Itching    Family History  Problem Relation Age of Onset  . Diabetes Mother   . Coronary artery disease Mother 56  . Gout Mother   . Heart disease Mother   . Heart attack Mother 48  . Diabetes Father   . Coronary artery disease Father 30       Died in a house fire   . Heart disease Father   . Heart disease Brother     Prior to Admission medications   Medication Sig Start Date End Date Taking? Authorizing Provider  apixaban (ELIQUIS) 5 MG TABS tablet Take 1 tablet (5 mg total) by mouth 2 (two) times daily. Patient taking differently: Take 5 mg by mouth at bedtime.  10/21/17  Yes Evans Lance, MD  atorvastatin (LIPITOR) 80 MG tablet Take 1 tablet (80 mg total) by mouth daily. Patient taking differently: Take 80 mg by mouth at bedtime.  10/21/17  Yes Evans Lance, MD  busPIRone (BUSPAR) 10 MG tablet Take 1 tablet (10 mg total) by mouth 3 (three) times daily. Patient taking differently: Take 10-20 mg by mouth 2 (two) times daily as needed (anxiety).  07/13/18 10/11/18 Yes Pucilowski, Olgierd A, MD  carvedilol (COREG) 25 MG tablet Take 1 tablet (25 mg total) by mouth 2 (two) times daily. 09/08/18  Yes Shirley Friar, PA-C  digoxin (LANOXIN) 0.125 MG tablet Take 1 tablet (0.125 mg total) by mouth daily. 10/21/17 10/16/18 Yes Evans Lance, MD  divalproex (DEPAKOTE ER) 500 MG 24 hr tablet Take 1 tablet (500 mg  total) by mouth daily. 07/13/18 10/11/18 Yes Pucilowski, Olgierd A, MD  Dulaglutide (TRULICITY) 1.5 VZ/8.5YI SOPN Inject 1.5 mg into the skin once a week. Patient taking differently: Inject 1.5 mg into the skin every Sunday.  03/26/18  Yes Renato Shin, MD  fenofibrate (TRICOR) 145 MG tablet Take 145 mg by mouth at bedtime.    Yes [provider]  FLUoxetine (PROZAC) 20 MG capsule Take 4 capsules (80 mg total) by mouth daily. 07/13/18 10/11/18 Yes Pucilowski, Olgierd A, MD  furosemide (LASIX) 20 MG tablet Take 40 mg by mouth 2 (two) times daily.  08/31/18  Yes [provider]  hydrOXYzine (VISTARIL) 50 MG capsule Take 1 capsule (50 mg total) by mouth 3 (three) times daily as needed for anxiety. 07/13/18 10/11/18 Yes Pucilowski, Marchia Bond, MD  Insulin Glargine (LANTUS SOLOSTAR)  100 UNIT/ML Solostar Pen Inject 50 Units into the skin every morning. 07/02/18  Yes Renato Shin, MD  insulin lispro (HUMALOG) 100 UNIT/ML injection Inject 5-10 Units into the skin See admin instructions. Inject depending upon blood sugar reading: base unit is 5 increase by 1 units per 50 blood sugar reading starting at 150.   Yes [provider]  lisinopril (PRINIVIL,ZESTRIL) 20 MG tablet Take 1 tablet (20 mg total) by mouth daily. 10/21/17  Yes Evans Lance, MD  magnesium oxide (MAG-OX) 400 MG tablet Take 400 mg by mouth daily.   Yes [provider]  potassium chloride SA (K-DUR,KLOR-CON) 20 MEQ tablet Take 1 tablet (20 mEq total) by mouth daily. Patient taking differently: Take 20 mEq by mouth at bedtime.  10/21/17 01/24/22 Yes Evans Lance, MD  glucose blood (FREESTYLE PRECISION NEO TEST) test strip 1 strip and 1 lancet, each 10 per day.  Uses Omnipod device. 02/27/18   Renato Shin, MD  omeprazole (PRILOSEC) 40 MG capsule Take 1 capsule (40 mg total) by mouth daily. Patient not taking: Reported on 10/01/2018 08/18/13   Larey Dresser, MD    Physical Exam: Vitals:   10/01/18 1415 10/01/18  1430 10/01/18 1445 10/01/18 1602  BP: 105/82 111/86 106/82 112/89  Pulse: 82 80 70 84  Resp:   18 14  Temp:    97.7 F (36.5 C)  TempSrc:    Oral  SpO2: 94% 97% 97% 100%  Weight:      Height:        Constitutional: NAD, calm, comfortable Vitals:   10/01/18 1415 10/01/18 1430 10/01/18 1445 10/01/18 1602  BP: 105/82 111/86 106/82 112/89  Pulse: 82 80 70 84  Resp:   18 14  Temp:    97.7 F (36.5 C)  TempSrc:    Oral  SpO2: 94% 97% 97% 100%  Weight:      Height:       Eyes: PERRL, lids and conjunctivae normal ENMT: Mucous membranes are moist. Posterior pharynx clear of any exudate or lesions.Normal dentition.  Neck: normal, supple, no masses, no thyromegaly Respiratory: clear to auscultation bilaterally, no wheezing, no crackles. Normal respiratory effort. No accessory muscle use.  Cardiovascular: Regular rate and rhythm, no murmurs / rubs / gallops.  Bilateral 2+ pitting edema positive in bilateral lower extremities.  2+ pedal pulses. No carotid bruits.  Abdomen: no tenderness, no masses palpated. No hepatosplenomegaly. Bowel sounds positive.  Musculoskeletal: no clubbing / cyanosis. No joint deformity upper and lower extremities. Good ROM, no contractures. Normal muscle tone.  Skin: no rashes, lesions, ulcers. No induration Neurologic: CN 2-12 grossly intact. Sensation intact, DTR normal. Strength 5/5 in all 4.  Psychiatric: Normal judgment and insight. Alert and oriented x 3. Normal mood.  Medicating well.  Not in acute distress.   Labs on Admission: I have personally reviewed following labs and imaging studies  CBC: Recent Labs  Lab 10/01/18 1029  WBC 7.6  HGB 13.9  HCT 43.0  MCV 84.8  PLT 998   Basic Metabolic Panel: Recent Labs  Lab 10/01/18 1029 10/01/18 1515  NA 134*  --   K 4.2  --   CL 100  --   CO2 25  --   GLUCOSE 261*  --   BUN 26*  --   CREATININE 1.47* 1.55*  CALCIUM 8.5*  --   MG  --  1.7  PHOS  --  5.0*   GFR: Estimated Creatinine  Clearance: 78.6 mL/min (A) (by  C-G formula based on SCr of 1.55 mg/dL (H)). Liver Function Tests: No results for input(s): AST, ALT, ALKPHOS, BILITOT, PROT, ALBUMIN in the last 168 hours. Recent Labs  Lab 10/01/18 1233  LIPASE 37   No results for input(s): AMMONIA in the last 168 hours. Coagulation Profile: No results for input(s): INR, PROTIME in the last 168 hours. Cardiac Enzymes: No results for input(s): CKTOTAL, CKMB, CKMBINDEX, TROPONINI in the last 168 hours. BNP (last 3 results) No results for input(s): PROBNP in the last 8760 hours. HbA1C: No results for input(s): HGBA1C in the last 72 hours. CBG: No results for input(s): GLUCAP in the last 168 hours. Lipid Profile: Recent Labs    10/01/18 1515  CHOL 176  HDL 25*  LDLCALC 126*  TRIG 126  CHOLHDL 7.0   Thyroid Function Tests: No results for input(s): TSH, T4TOTAL, FREET4, T3FREE, THYROIDAB in the last 72 hours. Anemia Panel: No results for input(s): VITAMINB12, FOLATE, FERRITIN, TIBC, IRON, RETICCTPCT in the last 72 hours. Urine analysis:    Component Value Date/Time   COLORURINE YELLOW 10/01/2018 1345   APPEARANCEUR CLEAR 10/01/2018 1345   LABSPEC 1.012 10/01/2018 1345   PHURINE 5.0 10/01/2018 1345   GLUCOSEU NEGATIVE 10/01/2018 1345   HGBUR NEGATIVE 10/01/2018 1345   BILIRUBINUR NEGATIVE 10/01/2018 1345   KETONESUR NEGATIVE 10/01/2018 1345   PROTEINUR 100 (A) 10/01/2018 1345   UROBILINOGEN 1.0 10/14/2011 1924   NITRITE NEGATIVE 10/01/2018 1345   LEUKOCYTESUR NEGATIVE 10/01/2018 1345    Radiological Exams on Admission: Dg Chest 2 View  Result Date: 10/01/2018 CLINICAL DATA:  Shortness of breath, chest pain EXAM: CHEST - 2 VIEW COMPARISON:  06/10/2013 FINDINGS: Cardiomegaly status post median sternotomy with left chest single lead pacer defibrillator. Mild diffuse interstitial pulmonary opacity. The visualized skeletal structures are unremarkable. IMPRESSION: Cardiomegaly with mild diffuse interstitial  pulmonary opacity, likely edema. No focal airspace opacity. Electronically Signed   By: Eddie Candle M.D.   On: 10/01/2018 10:46    EKG: Independently reviewed.  Normal sinus rhythm, prolonged QT interval, no ST elevation, Chronic T wave changes noted.     Assessment/Plan Active Problems:   HYPERTRIGLYCERIDEMIA   OBESITY   Essential hypertension, benign   Ischemic cardiomyopathy   Atrial fibrillation (HCC)   Uncontrolled type 2 diabetes mellitus with hyperglycemia (HCC)   GAD (generalized anxiety disorder)   Acute on chronic systolic CHF (congestive heart failure) (HCC)    Acute on chronic systolic congestive heart failure exacerbation: -Secondary to nonischemic cardiomyopathy. -Patient presents with shortness of breath, leg swelling, orthopnea, PND, gained 8 pounds.  Has ICD.  Has bilateral pitting edema on exam. -As per patient: He had transthoracic echo 3 to 4 weeks ago at [facility] in [place] ejection fraction of 15%. -proBNP: Elevated, chest x-ray shows cardiomegaly and fluid overload.  Lipase: WNL.  Troponin is slightly elevated- will trend. -Placed patient under observation. -Started on Lasix 40 mg IV twice daily, continue aspirin, atorvastatin, Coreg, lisinopril and digoxin. -Strict input and output and daily weight. -Potassium is 4.2, goal is > 4, check magnesium and phosphorus and digoxin level. -Check lipid panel.  Hypertension: Stable -Continue Coreg and lisinopril. -Monitor blood pressure closely.  CKD stage III A: Stable -Monitor kidney function closely.  Type 2 diabetes mellitus: Uncontrolled -Last A1c noted more than 10 -Repeat A1c today. -Continue Lantus 50 units at bedtime, started on sliding scale insulin -Monitor blood sugar closely.  Paroxysmal A. fib: Stable -On telemetry -Continue Coreg and Eliquis  Hyperlipidemia: Check lipid panel -  Continue atorvastatin and fenofibrate.  Generalized anxiety/PTSD: -Continue home meds: Prozac,  hydroxyzine, Depakote and BuSpar  GERD: Continue Protonix  Obesity: BMI of 31.50 -Counseled regarding dietary modification, exercise and weight loss.    DVT prophylaxis: Eliquis 5 mg p.o. twice daily.   Code Status: Full code Family Communication: None present at bedside.  Plan of care discussed with patient in length and he verbalized understanding and agreed with it. Disposition Plan: Home Consults called: None  admission status: Observation   Mckinley Jewel MD [facility] Pager [phone]  If 7PM-7AM, please contact night-coverage [email or web address] Password [email or web address]  10/01/2018, 4:10 PM

## 2018-10-01 NOTE — ED Provider Notes (Signed)
[facility] Emergency Department Provider Note MRN:  [id number]  Arrival date & time: 10/01/18     Chief Complaint   Shortness of Breath and Chest Pain   History of Present Illness   Corey Palmer is a 48 y.o. year-old male with a history of CAD, CHF, pacemaker, diabetes presenting to the ED with chief complaint of shortness of breath and chest pain.  2 days of increased shortness of breath, fluid retention in the legs, trouble laying flat.  Associated with central chest pain and epigastrium, described as a pressure, constant.  Denies dizziness or diaphoresis, no nausea or vomiting.  No diarrhea, no constipation, no lower abdominal pain.  No other exacerbating or relieving factors.  Review of Systems  A complete 10 system review of systems was obtained and all systems are negative except as noted in the HPI and PMH.   Patient's Health History    Past Medical History:  Diagnosis Date  . Anginal pain ([place])   . Anxiety   . Arthritis   . Automatic implantable cardioverter-defibrillator in situ    Medtronic   . Bipolar disorder ([place])   . CAD (coronary artery disease)    Cardiac cath (08/19/2000) - Nonobstructive, 40% stenosis of LAD,.   . Cancer [facility]) 2013   benign stomach cancer  . CHF (congestive heart failure) ([place])   . Chondrocalcinosis of right knee 06/11/2013  . Chronic systolic heart failure ([place])    2D echo (93/7902) - Systolic function severely reduced., LV EF 20-25%. Akinesis of apical and anteroseptal mycoardium.  last 2D echo (11/2008 ), LV EF 25-30%, diffuse hypokinesis, and grade 1 diastolic dysfunction.  . Depression   . Diabetes uncomplicated adult-type II    on insulin therapy, a1c 12.9 in 01/2011  . Dilated cardiomyopathy ([place])    status post ICD placement in 2008 c/b tearing at the atrial junction resulting in tamponade and urgent thoracotomy  . Dyslipidemia   . Dysrhythmia   . GERD (gastroesophageal reflux disease)   . Gout,  unspecified   . Hepatic steatosis 2011   seen on ultrasound.   . History of kidney stones   . Hx of echocardiogram    Echo (04/2013):  Mild LVH, EF 20-25%, mild LAE, mild to mod RVE with mild reduced RVSF.  Marland Kitchen Hypertension   . ICD (implantable cardiac defibrillator) in place 2007  . Myocardial infarction ([place]) 2008  . Obesity (BMI 30.0-34.9)   . Pacemaker   . Paroxysmal atrial fibrillation (HCC)    on chronic coumadin, goal INR 2-3. status post direct current  . Peptic ulcer   . Shortness of breath   . Sleep apnea    does not have CPAP    Past Surgical History:  Procedure Laterality Date  . CARDIAC CATHETERIZATION    . CARDIAC PACEMAKER PLACEMENT    . CHOLECYSTECTOMY    . COLONOSCOPY    . CORONARY ARTERY BYPASS GRAFT    . ESOPHAGOGASTRODUODENOSCOPY  04/05/2011   Procedure: ESOPHAGOGASTRODUODENOSCOPY (EGD);  Surgeon: Irene Shipper, MD;  Location: [facility] ENDOSCOPY;  Service: Endoscopy;  Laterality: N/A;  . JOINT REPLACEMENT     left knee replacement  . KNEE ARTHROSCOPY Right 06/11/2013   Procedure: RIGHT KNEE ARTHROSCOPY with chondroplasty;  Surgeon: Johnny Bridge, MD;  Location: [place];  Service: Orthopedics;  Laterality: Right;  . TOTAL KNEE ARTHROPLASTY Left     Family History  Problem Relation Age of Onset  . Diabetes Mother   . Coronary artery disease  Mother 83  . Gout Mother   . Heart disease Mother   . Heart attack Mother 36  . Diabetes Father   . Coronary artery disease Father 53       Died in a house fire   . Heart disease Father   . Heart disease Brother     Social History   Socioeconomic History  . Marital status: Married    Spouse name: Not on file  . Number of children: 3  . Years of education: college  . Highest education level: Not on file  Occupational History  . Occupation: bar tender  Social Needs  . Financial resource strain: Not on file  . Food insecurity    Worry: Not on file    Inability: Not on file  . Transportation needs    Medical: Not on  file    Non-medical: Not on file  Tobacco Use  . Smoking status: Never Smoker  . Smokeless tobacco: Never Used  Substance and Sexual Activity  . Alcohol use: No  . Drug use: No  . Sexual activity: Yes  Lifestyle  . Physical activity    Days per week: Not on file    Minutes per session: Not on file  . Stress: Not on file  Relationships  . Social Herbalist on phone: Not on file    Gets together: Not on file    Attends religious service: Not on file    Active member of club or organization: Not on file    Attends meetings of clubs or organizations: Not on file    Relationship status: Not on file  . Intimate partner violence    Fear of current or ex partner: Not on file    Emotionally abused: Not on file    Physically abused: Not on file    Forced sexual activity: Not on file  Other Topics Concern  . Not on file  Social History Narrative   Lives in [place].   Studied sports medicine in college, got a BS - used to work with the [employer].           Physical Exam  Vital Signs and Nursing Notes reviewed Vitals:   10/01/18 1345 10/01/18 1415  BP: 112/88 105/82  Pulse: 83 82  Resp: 20   Temp:    SpO2: 95% 94%    CONSTITUTIONAL: Well-appearing, NAD NEURO:  Alert and oriented x 3, no focal deficits EYES:  eyes equal and reactive ENT/NECK:  no LAD, no JVD CARDIO: Regular rate, well-perfused, normal S1 and S2 PULM:  CTAB no wheezing or rhonchi GI/GU:  normal bowel sounds, non-distended, mild epigastric tenderness to palpation MSK/SPINE:  No gross deformities, no edema SKIN:  no rash, atraumatic PSYCH:  Appropriate speech and behavior  Diagnostic and Interventional Summary    EKG Interpretation  Date/Time:  Thursday October 01 2018 10:22:01 EDT Ventricular Rate:  87 PR Interval:  194 QRS Duration: 114 QT Interval:  428 QTC Calculation: 515 R Axis:   83 Text Interpretation:  Normal sinus rhythm Possible Left atrial enlargement Cannot rule out  Anterior infarct , age undetermined T wave abnormality, consider inferolateral ischemia Prolonged QT Abnormal ECG Confirmed by Gerlene Fee [phone]) on 10/01/2018 12:35:06 PM      Labs Reviewed  BASIC METABOLIC PANEL - Abnormal; Notable for the following components:      Result Value   Sodium 134 (*)    Glucose, Bld 261 (*)    BUN 26 (*)  Creatinine, Ser 1.47 (*)    Calcium 8.5 (*)    GFR calc non Af Amer 55 (*)    All other components within normal limits  URINALYSIS, ROUTINE W REFLEX MICROSCOPIC - Abnormal; Notable for the following components:   Protein, ur 100 (*)    All other components within normal limits  BRAIN NATRIURETIC PEPTIDE - Abnormal; Notable for the following components:   B Natriuretic Peptide 659.0 (*)    All other components within normal limits  TROPONIN I (HIGH SENSITIVITY) - Abnormal; Notable for the following components:   Troponin I (High Sensitivity) 21 (*)    All other components within normal limits  SARS CORONAVIRUS 2 (TAT 6-24 HRS)  CBC  LIPASE, BLOOD  TROPONIN I (HIGH SENSITIVITY)    DG Chest 2 View  Final Result      Medications  sodium chloride flush (NS) 0.9 % injection 3 mL (has no administration in time range)  HYDROmorphone (DILAUDID) injection 1 mg (1 mg Intramuscular Given 10/01/18 1253)  aspirin chewable tablet 324 mg (324 mg Oral Given 10/01/18 1254)  furosemide (LASIX) injection 40 mg (40 mg Intravenous Given 10/01/18 1253)     Procedures Critical Care  ED Course and Medical Decision Making  I have reviewed the triage vital signs and the nursing notes.  Pertinent labs & imaging results that were available during my care of the patient were reviewed by me and considered in my medical decision making (see below for details).  Considering ACS, pancreatitis, CHF exacerbation, results pending.  Labs reveal mildly elevated troponin in the 20s, elevated BNP, mild AKI.  AKI favored to be cardiorenal in etiology.  Patient given Lasix, given  the chest pain and elevated troponin will admit to medicine service.  Barth Kirks. Sedonia Small, [place] [email or web address]  Final Clinical Impressions(s) / ED Diagnoses     ICD-10-CM   1. Chest pain, unspecified type  R07.9   2. Acute on chronic congestive heart failure, unspecified heart failure type ([place])  I50.9     ED Discharge Orders    None      Discharge Instructions Discussed with and Provided to Patient: Discharge Instructions   None       Maudie Flakes, MD 10/01/18 1424

## 2018-10-01 NOTE — ED Notes (Signed)
ED TO INPATIENT HANDOFF REPORT  ED Nurse Name and Phone #:  Elmyra Ricks [phone]  S Name/Age/Gender Corey Palmer 49 y.o. male Room/Bed: 027C/027C  Code Status   Code Status: Full Code  Home/SNF/Other Home Patient oriented to: self, place, time and situation Is this baseline? Yes   Triage Complete: Triage complete  Chief Complaint cant breathe/blood in urine  Triage Note Pt reports SOB/ Chest Pain while at work. Has hx of CHF. Reports generalized weakness, swelling in lower extremities, taking diuetics. C/o noting blood in urine this morning. Has pacemaker/defibrillator.    Allergies Allergies  Allergen Reactions  . Orange Fruit Anaphylaxis  . Penicillins Anaphylaxis    Did it involve swelling of the face/tongue/throat, SOB, or low BP? Yes Did it involve sudden or severe rash/hives, skin peeling, or any reaction on the inside of your mouth or nose? Yes Did you need to seek medical attention at a hospital or doctor's office? No When did it last happen?childhood If all above answers are "NO", may proceed with cephalosporin use.  [name] [Empagliflozin] Itching    Level of Care/Admitting Diagnosis ED Disposition    ED Disposition Condition [facility] Area: [place] [[id number]]  Level of Care: Med-Surg [16]  I expect the patient will be discharged within 24 hours: Yes  LOW acuity---Tx typically complete <24 hrs---ACUTE conditions typically can be evaluated <24 hours---LABS likely to return to acceptable levels <24 hours---IS near functional baseline---EXPECTED to return to current living arrangement---NOT newly hypoxic: Meets criteria for 5C-Observation unit  Covid Evaluation: Asymptomatic Screening Protocol (No Symptoms)  Diagnosis: Acute on chronic systolic CHF (congestive heart failure) [facility]) [[id number]]  Admitting Physician: Mckinley Jewel [[id number]]  Attending Physician: Mckinley Jewel [phone]  PT Class (Do Not Modify):  Observation [104]  PT Acc Code (Do Not Modify): Observation [10022]       B Medical/Surgery History Past Medical History:  Diagnosis Date  . Anginal pain ([place])   . Anxiety   . Arthritis   . Automatic implantable cardioverter-defibrillator in situ 2007   [employer]   . Bipolar disorder ([place])   . CAD (coronary artery disease)    Cardiac cath (08/19/2000) - Nonobstructive, 40% stenosis of LAD,.   . Cancer [facility]) 2013   benign stomach cancer  . CHF (congestive heart failure) ([place])   . Chondrocalcinosis of right knee 06/11/2013  . Chronic systolic heart failure ([place])    2D echo (66/0630) - Systolic function severely reduced., LV EF 20-25%. Akinesis of apical and anteroseptal mycoardium.  last 2D echo (11/2008 ), LV EF 25-30%, diffuse hypokinesis, and grade 1 diastolic dysfunction.  . Depression   . Diabetes uncomplicated adult-type II    on insulin therapy, a1c 12.9 in 01/2011  . Dilated cardiomyopathy ([place])    status post ICD placement in 2008 c/b tearing at the atrial junction resulting in tamponade and urgent thoracotomy  . Dyslipidemia   . Dysrhythmia   . GERD (gastroesophageal reflux disease)   . Gout, unspecified   . Hepatic steatosis 2011   seen on ultrasound.   . History of kidney stones   . Hx of echocardiogram    Echo (04/2013):  Mild LVH, EF 20-25%, mild LAE, mild to mod RVE with mild reduced RVSF.  Marland Kitchen Hypertension   . Obesity (BMI 30.0-34.9)   . Pacemaker   . Paroxysmal atrial fibrillation (HCC)    on chronic coumadin, goal INR 2-3. status post direct current  . Peptic ulcer   .  Shortness of breath   . Sleep apnea    does not have CPAP   Past Surgical History:  Procedure Laterality Date  . CARDIAC CATHETERIZATION    . CARDIAC PACEMAKER PLACEMENT    . CHOLECYSTECTOMY    . COLONOSCOPY    . CORONARY ARTERY BYPASS GRAFT    . ESOPHAGOGASTRODUODENOSCOPY  04/05/2011   Procedure: ESOPHAGOGASTRODUODENOSCOPY (EGD);  Surgeon: Irene Shipper, MD;  Location: [facility] ENDOSCOPY;   Service: Endoscopy;  Laterality: N/A;  . JOINT REPLACEMENT     left knee replacement  . KNEE ARTHROSCOPY Right 06/11/2013   Procedure: RIGHT KNEE ARTHROSCOPY with chondroplasty;  Surgeon: Johnny Bridge, MD;  Location: [place];  Service: Orthopedics;  Laterality: Right;  . TOTAL KNEE ARTHROPLASTY Left      A IV Location/Drains/Wounds Patient Lines/Drains/Airways Status   Active Line/Drains/Airways    Name:   Placement date:   Placement time:   Site:   Days:   Peripheral IV 10/01/18 Left Antecubital   10/01/18    1310    Antecubital   less than 1   Incision (Closed) 06/11/13 Leg   06/11/13    1323     1938   Wound / Incision (Open or Dehisced) 08/08/17 Other (Comment) Ankle Posterior  2.5 cm x 1.5 cm                      08/08/17    0258    Ankle   419          Intake/Output Last 24 hours No intake or output data in the 24 hours ending 10/01/18 1538  Labs/Imaging Results for orders placed or performed during the hospital encounter of 10/01/18 (from the past 48 hour(s))  Basic metabolic panel     Status: Abnormal   Collection Time: 10/01/18 10:29 AM  Result Value Ref Range   Sodium 134 (L) 135 - 145 mmol/L   Potassium 4.2 3.5 - 5.1 mmol/L   Chloride 100 98 - 111 mmol/L   CO2 25 22 - 32 mmol/L   Glucose, Bld 261 (H) 70 - 99 mg/dL   BUN 26 (H) 6 - 20 mg/dL   Creatinine, Ser 1.47 (H) 0.61 - 1.24 mg/dL   Calcium 8.5 (L) 8.9 - 10.3 mg/dL   GFR calc non Af Amer 55 (L) >60 mL/min   GFR calc Af Amer >60 >60 mL/min   Anion gap 9 5 - 15    Comment: Performed at [facility], [address], [place], [place] [address]  CBC     Status: None   Collection Time: 10/01/18 10:29 AM  Result Value Ref Range   WBC 7.6 4.0 - 10.5 K/uL   RBC 5.07 4.22 - 5.81 MIL/uL   Hemoglobin 13.9 13.0 - 17.0 g/dL   HCT 43.0 39.0 - 52.0 %   MCV 84.8 80.0 - 100.0 fL   MCH 27.4 26.0 - 34.0 pg   MCHC 32.3 30.0 - 36.0 g/dL   RDW 14.2 11.5 - 15.5 %   Platelets 219 150 - 400 K/uL   nRBC 0.0 0.0 - 0.2 %     Comment: Performed at [facility], [place] [address] [place], [place] [address]  Troponin I (High Sensitivity)     Status: Abnormal   Collection Time: 10/01/18 12:33 PM  Result Value Ref Range   Troponin I (High Sensitivity) 21 (H) <18 ng/L    Comment: (NOTE) Elevated high sensitivity troponin I (hsTnI) values  and significant  changes across serial measurements may suggest ACS but many other  chronic and acute conditions are known to elevate hsTnI results.  Refer to the "Links" section for chest pain algorithms and additional  guidance. Performed at [facility], [place] [address] [place], [place] [address]   Lipase, blood     Status: None   Collection Time: 10/01/18 12:33 PM  Result Value Ref Range   Lipase 37 11 - 51 U/L    Comment: Performed at [facility], [place] [address], [place], [place] [address]  Brain natriuretic peptide     Status: Abnormal   Collection Time: 10/01/18 12:53 PM  Result Value Ref Range   B Natriuretic Peptide 659.0 (H) 0.0 - 100.0 pg/mL    Comment: Performed at [facility] [address], [place], [place] [address]  Urinalysis, Routine w reflex microscopic     Status: Abnormal   Collection Time: 10/01/18  1:45 PM  Result Value Ref Range   Color, Urine YELLOW YELLOW   APPearance CLEAR CLEAR   Specific Gravity, Urine 1.012 1.005 - 1.030   pH 5.0 5.0 - 8.0   Glucose, UA NEGATIVE NEGATIVE mg/dL   Hgb urine dipstick NEGATIVE NEGATIVE   Bilirubin Urine NEGATIVE NEGATIVE   Ketones, ur NEGATIVE NEGATIVE mg/dL   Protein, ur 100 (A) NEGATIVE mg/dL   Nitrite NEGATIVE NEGATIVE   Leukocytes,Ua NEGATIVE NEGATIVE   RBC / HPF 0-5 0 - 5 RBC/hpf   WBC, UA 0-5 0 - 5 WBC/hpf   Bacteria, UA NONE SEEN NONE SEEN   Mucus PRESENT    Hyaline Casts, UA PRESENT     Comment: Performed at [facility] [address], [place], [place] [address]   Dg Chest 2 View  Result Date: 10/01/2018 CLINICAL DATA:  Shortness of breath, chest pain EXAM:  CHEST - 2 VIEW COMPARISON:  06/10/2013 FINDINGS: Cardiomegaly status post median sternotomy with left chest single lead pacer defibrillator. Mild diffuse interstitial pulmonary opacity. The visualized skeletal structures are unremarkable. IMPRESSION: Cardiomegaly with mild diffuse interstitial pulmonary opacity, likely edema. No focal airspace opacity. Electronically Signed   By: Eddie Candle M.D.   On: 10/01/2018 10:46    Pending Labs Unresulted Labs (From admission, onward)    Start     Ordered   10/08/18 0500  Creatinine, serum  (enoxaparin (LOVENOX)    CrCl >/= 30 ml/min)  Weekly,   R    Comments: while on enoxaparin therapy    10/01/18 1515   10/02/18 6195  Basic metabolic panel  Daily,   R     10/01/18 1515   10/02/18 0500  CBC  Tomorrow morning,   R     10/01/18 1517   10/02/18 0500  Magnesium  Tomorrow morning,   R     10/01/18 1518   10/01/18 1529  Digoxin level  Once,   STAT     10/01/18 1528   10/01/18 1518  Hemoglobin A1c  Once,   STAT     10/01/18 1517   10/01/18 1517  TSH  Once,   STAT     10/01/18 1516   10/01/18 1515  Creatinine, serum  Once,   STAT     10/01/18 1515   10/01/18 1515  Lipid panel  Once,   STAT     10/01/18 1515   10/01/18 1515  Magnesium  Once,   STAT     10/01/18 1515   10/01/18 1515  Phosphorus  Once,   STAT     10/01/18 1515   10/01/18 1510  HIV antibody (Routine Testing)  Once,   STAT     10/01/18 1515   10/01/18 1506  CBC  (enoxaparin (LOVENOX)    CrCl >/= 30 ml/min)  Once,   STAT    Comments: Baseline for enoxaparin therapy IF NOT ALREADY DRAWN.  Notify MD if PLT < 100 K.    10/01/18 1515   10/01/18 1423  SARS CORONAVIRUS 2 (TAT 6-24 HRS) Nasopharyngeal Nasopharyngeal Swab  (Asymptomatic/Tier 2 Patients Labs)  Once,   STAT    Question Answer Comment  Is this test for diagnosis or screening Screening   Symptomatic for COVID-19 as defined by CDC No   Hospitalized for COVID-19 No   Admitted to ICU for COVID-19 No   Previously tested for  COVID-19 No   Resident in a congregate (group) care setting Unknown   Employed in healthcare setting Unknown      10/01/18 1423          Vitals/Pain Today's Vitals   10/01/18 1345 10/01/18 1415 10/01/18 1430 10/01/18 1445  BP: 112/88 105/82 111/86 106/82  Pulse: 83 82 80 70  Resp: 20     Temp:      TempSrc:      SpO2: 95% 94% 97% 97%  Weight:      Height:      PainSc:        Isolation Precautions No active isolations  Medications Medications  sodium chloride flush (NS) 0.9 % injection 3 mL (has no administration in time range)  sodium chloride flush (NS) 0.9 % injection 3 mL (has no administration in time range)  sodium chloride flush (NS) 0.9 % injection 3 mL (has no administration in time range)  0.9 %  sodium chloride infusion (has no administration in time range)  acetaminophen (TYLENOL) tablet 650 mg (has no administration in time range)  furosemide (LASIX) injection 40 mg (has no administration in time range)  potassium chloride (K-DUR) CR tablet 10 mEq (has no administration in time range)  aspirin EC tablet 81 mg (has no administration in time range)  pantoprazole (PROTONIX) EC tablet 40 mg (has no administration in time range)  atorvastatin (LIPITOR) tablet 80 mg (has no administration in time range)  carvedilol (COREG) tablet 25 mg (has no administration in time range)  digoxin (LANOXIN) tablet 0.125 mg (has no administration in time range)  fenofibrate tablet 160 mg (has no administration in time range)  lisinopril (ZESTRIL) tablet 20 mg (has no administration in time range)  busPIRone (BUSPAR) tablet 10 mg (has no administration in time range)  FLUoxetine (PROZAC) capsule 80 mg (has no administration in time range)  hydrOXYzine (VISTARIL) capsule 50 mg (has no administration in time range)  Insulin Glargine (LANTUS) Solostar Pen 50 Units (has no administration in time range)  magnesium oxide (MAG-OX) tablet 400 mg (has no administration in time range)   apixaban (ELIQUIS) tablet 5 mg (has no administration in time range)  divalproex (DEPAKOTE ER) 24 hr tablet 500 mg (has no administration in time range)  insulin aspart (novoLOG) injection 0-15 Units (has no administration in time range)  insulin aspart (novoLOG) injection 0-5 Units (has no administration in time range)  HYDROmorphone (DILAUDID) injection 1 mg (1 mg Intramuscular Given 10/01/18 1253)  aspirin chewable tablet 324 mg (324 mg Oral Given 10/01/18 1254)  furosemide (LASIX) injection 40 mg (40 mg Intravenous Given 10/01/18 1253)    Mobility  walks Low fall risk   Focused Assessments Cardiac Assessment Handoff:    Lab Results  Component Value Date   CKTOTAL 45 (L) 08/08/2017   CKMB 4.3 (H) 07/17/2011   TROPONINI <0.30 10/15/2011   Lab Results  Component Value Date   DDIMER 1.78 (H) 07/16/2011   Does the Patient currently have chest pain? No     R Recommendations: See Admitting Provider Note  Report given to:   Additional Notes:

## 2018-10-01 NOTE — Significant Event (Signed)
Rapid Response Event Note  Overview:Called d/t decreased LOC and decreased RR. Time Called: 2222 Arrival Time: 2310 Event Type: Neurologic, Respiratory  Initial Focused Assessment: Pt laying in bed with eyes closed, very hard to arouse, snoring respirations. With repeated stimulation, pt will awaken, is alert and oriented x 3 , will answer questions and follow commands, however goes back to sleep very easily. HR-72, SBP-140s, RR-16, SpO2-100% on 4L [place]. Sleep apnea witnessed on assessment.  Pt received: 1mg  dilaudid-2047 1mg  ativan-2200  Interventions: CPAP   Plan of Care (if not transferred): CPAP. Pt still very sleepy, monitor closely until back to baseline, call RRT if further assistance needed. Event Summary: Name of Physician Notified: Schorr, NP at 2310    at    Outcome: Stayed in room and stabalized  Event End Time: 2315  [place]

## 2018-10-01 NOTE — ED Triage Notes (Signed)
Pt reports SOB/ Chest Pain while at work. Has hx of CHF. Reports generalized weakness, swelling in lower extremities, taking diuetics. C/o noting blood in urine this morning. Has pacemaker/defibrillator.

## 2018-10-02 ENCOUNTER — Observation Stay (HOSPITAL_COMMUNITY): Payer: BC Managed Care – PPO

## 2018-10-02 ENCOUNTER — Inpatient Hospital Stay: Payer: Self-pay

## 2018-10-02 DIAGNOSIS — K219 Gastro-esophageal reflux disease without esophagitis: Secondary | ICD-10-CM | POA: Diagnosis present

## 2018-10-02 DIAGNOSIS — I10 Essential (primary) hypertension: Secondary | ICD-10-CM

## 2018-10-02 DIAGNOSIS — I130 Hypertensive heart and chronic kidney disease with heart failure and stage 1 through stage 4 chronic kidney disease, or unspecified chronic kidney disease: Secondary | ICD-10-CM | POA: Diagnosis present

## 2018-10-02 DIAGNOSIS — E669 Obesity, unspecified: Secondary | ICD-10-CM | POA: Diagnosis present

## 2018-10-02 DIAGNOSIS — F419 Anxiety disorder, unspecified: Secondary | ICD-10-CM | POA: Diagnosis present

## 2018-10-02 DIAGNOSIS — N184 Chronic kidney disease, stage 4 (severe): Secondary | ICD-10-CM | POA: Diagnosis present

## 2018-10-02 DIAGNOSIS — F4310 Post-traumatic stress disorder, unspecified: Secondary | ICD-10-CM | POA: Diagnosis present

## 2018-10-02 DIAGNOSIS — I480 Paroxysmal atrial fibrillation: Secondary | ICD-10-CM | POA: Diagnosis present

## 2018-10-02 DIAGNOSIS — Z6831 Body mass index (BMI) 31.0-31.9, adult: Secondary | ICD-10-CM

## 2018-10-02 DIAGNOSIS — I509 Heart failure, unspecified: Secondary | ICD-10-CM

## 2018-10-02 DIAGNOSIS — Z20828 Contact with and (suspected) exposure to other viral communicable diseases: Secondary | ICD-10-CM | POA: Diagnosis present

## 2018-10-02 DIAGNOSIS — I5023 Acute on chronic systolic (congestive) heart failure: Secondary | ICD-10-CM

## 2018-10-02 DIAGNOSIS — R079 Chest pain, unspecified: Secondary | ICD-10-CM | POA: Diagnosis present

## 2018-10-02 DIAGNOSIS — F411 Generalized anxiety disorder: Secondary | ICD-10-CM | POA: Diagnosis present

## 2018-10-02 DIAGNOSIS — N179 Acute kidney failure, unspecified: Secondary | ICD-10-CM | POA: Diagnosis present

## 2018-10-02 DIAGNOSIS — E1165 Type 2 diabetes mellitus with hyperglycemia: Secondary | ICD-10-CM | POA: Diagnosis present

## 2018-10-02 DIAGNOSIS — I420 Dilated cardiomyopathy: Secondary | ICD-10-CM | POA: Diagnosis present

## 2018-10-02 DIAGNOSIS — E876 Hypokalemia: Secondary | ICD-10-CM | POA: Diagnosis present

## 2018-10-02 DIAGNOSIS — I428 Other cardiomyopathies: Secondary | ICD-10-CM

## 2018-10-02 DIAGNOSIS — E785 Hyperlipidemia, unspecified: Secondary | ICD-10-CM | POA: Diagnosis present

## 2018-10-02 DIAGNOSIS — E1122 Type 2 diabetes mellitus with diabetic chronic kidney disease: Secondary | ICD-10-CM | POA: Diagnosis present

## 2018-10-02 DIAGNOSIS — E1142 Type 2 diabetes mellitus with diabetic polyneuropathy: Secondary | ICD-10-CM | POA: Diagnosis present

## 2018-10-02 DIAGNOSIS — Z7901 Long term (current) use of anticoagulants: Secondary | ICD-10-CM | POA: Diagnosis not present

## 2018-10-02 DIAGNOSIS — G4733 Obstructive sleep apnea (adult) (pediatric): Secondary | ICD-10-CM | POA: Diagnosis present

## 2018-10-02 DIAGNOSIS — Z880 Allergy status to penicillin: Secondary | ICD-10-CM | POA: Diagnosis not present

## 2018-10-02 DIAGNOSIS — Z6833 Body mass index (BMI) 33.0-33.9, adult: Secondary | ICD-10-CM | POA: Diagnosis not present

## 2018-10-02 DIAGNOSIS — I255 Ischemic cardiomyopathy: Secondary | ICD-10-CM | POA: Diagnosis present

## 2018-10-02 DIAGNOSIS — Z23 Encounter for immunization: Secondary | ICD-10-CM | POA: Diagnosis not present

## 2018-10-02 DIAGNOSIS — E781 Pure hyperglyceridemia: Secondary | ICD-10-CM | POA: Diagnosis present

## 2018-10-02 LAB — GLUCOSE, CAPILLARY
Glucose-Capillary: 202 mg/dL — ABNORMAL HIGH (ref 70–99)
Glucose-Capillary: 225 mg/dL — ABNORMAL HIGH (ref 70–99)
Glucose-Capillary: 231 mg/dL — ABNORMAL HIGH (ref 70–99)
Glucose-Capillary: 205 mg/dL — ABNORMAL HIGH (ref 70–99)
Glucose-Capillary: 206 mg/dL — ABNORMAL HIGH (ref 70–99)

## 2018-10-02 LAB — BASIC METABOLIC PANEL
Anion gap: 10 (ref 5–15)
BUN: 37 mg/dL — ABNORMAL HIGH (ref 6–20)
CO2: 24 mmol/L (ref 22–32)
Calcium: 8.6 mg/dL — ABNORMAL LOW (ref 8.9–10.3)
Chloride: 100 mmol/L (ref 98–111)
Creatinine, Ser: 1.74 mg/dL — ABNORMAL HIGH (ref 0.61–1.24)
GFR calc Af Amer: 52 mL/min — ABNORMAL LOW (ref >60)
GFR calc non Af Amer: 45 mL/min — ABNORMAL LOW (ref >60)
Glucose, Bld: 227 mg/dL — ABNORMAL HIGH (ref 70–99)
Potassium: 4.8 mmol/L (ref 3.5–5.1)
Sodium: 134 mmol/L — ABNORMAL LOW (ref 135–145)

## 2018-10-02 LAB — HIV ANTIBODY (ROUTINE TESTING W REFLEX): HIV Screen 4th Generation wRfx: NONREACTIVE

## 2018-10-02 LAB — CBC
HCT: 47.6 % (ref 39.0–52.0)
Hemoglobin: 15.3 g/dL (ref 13.0–17.0)
MCH: 27.6 pg (ref 26.0–34.0)
MCHC: 32.1 g/dL (ref 30.0–36.0)
MCV: 85.9 fL (ref 80.0–100.0)
Platelets: 219 10*3/uL (ref 150–400)
RBC: 5.54 MIL/uL (ref 4.22–5.81)
RDW: 14.5 % (ref 11.5–15.5)
WBC: 7.2 10*3/uL (ref 4.0–10.5)
nRBC: 0 % (ref 0.0–0.2)

## 2018-10-02 LAB — MAGNESIUM: Magnesium: 1.9 mg/dL (ref 1.7–2.4)

## 2018-10-02 MED ORDER — MORPHINE SULFATE (PF) 2 MG/ML IV SOLN
2.0000 mg | Freq: Once | INTRAVENOUS | Status: AC
  Administered 2018-10-02: 2 mg via INTRAVENOUS
  Filled 2018-10-02: qty 1

## 2018-10-02 MED ORDER — ONDANSETRON HCL 4 MG/2ML IJ SOLN
4.0000 mg | Freq: Once | INTRAMUSCULAR | Status: AC
  Administered 2018-10-02: 05:00:00 4 mg via INTRAVENOUS
  Filled 2018-10-02: qty 2

## 2018-10-02 MED ORDER — DIPHENHYDRAMINE-ZINC ACETATE 2-0.1 % EX CREA
TOPICAL_CREAM | Freq: Every day | CUTANEOUS | Status: DC | PRN
  Administered 2018-10-02: 16:00:00 via TOPICAL
  Filled 2018-10-02: qty 28

## 2018-10-02 MED ORDER — NITROGLYCERIN 0.4 MG SL SUBL
SUBLINGUAL_TABLET | SUBLINGUAL | Status: AC
  Filled 2018-10-02: qty 1

## 2018-10-02 MED ORDER — PANTOPRAZOLE SODIUM 40 MG IV SOLR
40.0000 mg | Freq: Once | INTRAVENOUS | Status: AC
  Administered 2018-10-02: 40 mg via INTRAVENOUS
  Filled 2018-10-02: qty 40

## 2018-10-02 MED ORDER — LISINOPRIL 10 MG PO TABS
10.0000 mg | ORAL_TABLET | Freq: Every day | ORAL | Status: DC
  Administered 2018-10-02 – 2018-10-05 (×4): 10 mg via ORAL
  Filled 2018-10-02 (×4): qty 1

## 2018-10-02 MED ORDER — NITROGLYCERIN 0.4 MG SL SUBL
0.4000 mg | SUBLINGUAL_TABLET | SUBLINGUAL | Status: DC | PRN
  Administered 2018-10-02: 15:00:00 0.4 mg via SUBLINGUAL

## 2018-10-02 NOTE — Progress Notes (Signed)
R. Barrett paged ref to pt arriving to 4E without IR placing line for CVP monitoring. No response. Will page cardiology. Jerald Kief

## 2018-10-02 NOTE — Progress Notes (Signed)
CARDIAC REHAB PHASE I   Offered to walk with pt. Pt c/o chest/upper abd pain/pressure. Pt states pain is similar to what he was having at home that was relieved by NTG. Pt also states he has received lasix today but has not had much output. Pt states he feels like he could urinate, but nothing is coming out. Pt also c/o itching. RN made aware of all issues. Pt given CHF booklet, diabetic and low sodium diets. Will continue to follow and encourage ambulation with pt.  [id number] Rufina Falco, RN BSN 10/02/2018 2:37 PM

## 2018-10-02 NOTE — Progress Notes (Signed)
RN made aware that PICC will not be placed tonight. She will contact MD for further orders. Will continue to monitor.

## 2018-10-02 NOTE — Progress Notes (Signed)
Inpatient Diabetes Program Recommendations  AACE/ADA: New Consensus Statement on Inpatient Glycemic Control   Target Ranges:  Prepandial:   less than 140 mg/dL      Peak postprandial:   less than 180 mg/dL (1-2 hours)      Critically ill patients:  140 - 180 mg/dL   Results for DEMARRIO, MENGES (MRN [id number]) as of 10/02/2018 10:02  Ref. Range 10/01/2018 16:30 10/01/2018 21:40 10/02/2018 01:40 10/02/2018 06:54  Glucose-Capillary Latest Ref Range: 70 - 99 mg/dL 230 (H) 243 (H) 202 (H) 225 (H)  Results for DARRELL, HAUK (MRN [id number]) as of 10/02/2018 10:02  Ref. Range 02/03/2018 15:32 10/01/2018 16:02  Hemoglobin A1C Latest Ref Range: 4.8 - 5.6 % 10.4 (A) 11.9 (H)   Review of Glycemic Control  Diabetes history: DM2 Outpatient DM medications: Trulicity 1.5 mg Qweek (Sunday), Lantus 50 units QHS,  Humalog 5 plus 1 unit for every 50 mg/dl <150 mg/dl  Current orders for Inpatient glycemic control: Lantus 50 units QAM, Novolog 0-15 units TID with meals, Novolog 0-5 units QHS  Inpatient Diabetes Program Recommendations:   Insulin-Meal Coverage: If post prandial glucose is consistently greater than 180 mg/dl, please consider ordering Novolog 4 units TID with meals for meal coverage.  A1C: A1C 11.9% on 10/01/18 indicating an average glucose of 295 mg/dl over the past 2-3 months.   NOTE: Noted consult for Diabetes Coordinator. Chart reviewed. Noted patient is followed by Dr. Loanne Drilling for DM management. Per chart, patient last seen Dr. Loanne Drilling on 07/02/18.  Noted in chart, that in April of 2020 patient was using an insulin pump for DM control. Spoke with patient about diabetes and home regimen for diabetes control. Patient reports being followed by Dr. Loanne Drilling for diabetes management and currently taking Trulicity 1.5 mg Qweek (Sunday), Lantus 50 units QHS,  Humalog 5 plus 1 unit for every 50 mg/dl <150 mg/dl as an outpatient for diabetes control. Patient reports taking DM medications as prescribed and last seen Dr.  Loanne Drilling in June 2020.  Patient reports that no changes were made with DM medications at his last office visit.  Inquired about using an insulin pump in the past and patient states that he stopped using his Omni Pod insulin pump 2 months ago due to cost of insulin pump supplies. Patient reports that current DM medications are affordable and he does not have any issues with affording them.  Patient reports checking glucose 3 times per day and that it is usually in the 100's mg/dl.  Inquired about prior A1C and patient reports his last A1C was 14%. Discussed A1C results (11.9% on 10/01/18) and explained that current A1C indicates an average glucose of 295 mg/dl over the past 2-3 months. Inquired about why patient felt his A1C was so high considering he reports glucose is usually in the 100's mg/dl and patient reports he does not know why.   Discussed glucose and A1C goals. Discussed importance of checking CBGs and maintaining good CBG control to prevent long-term and short-term complications. Explained how hyperglycemia leads to damage within blood vessels which lead to the common complications seen with uncontrolled diabetes. Stressed to the patient the importance of improving glycemic control to prevent further complications from uncontrolled diabetes. Encouraged patient to keep a detailed record of glucose trends and insulin takes and to make a follow up appointment with Dr. Loanne Drilling. Patient verbalized understanding of information discussed and reports no further questions at this time related to diabetes.  Thanks, Barnie Alderman, RN, MSN, CDE Diabetes  Coordinator Inpatient Diabetes Program [phone] (Team Pager from 8am to [place])

## 2018-10-02 NOTE — Consult Note (Addendum)
Cardiology Consultation:   Patient ID: Corey Palmer; [id number]; 1970-12-31   Admit date: 10/01/2018 Date of Consult: 10/02/2018  Primary Care Provider: London Pepper, MD Primary Cardiologist: No primary care provider on file.  Primary Electrophysiologist:  Corey Peru, MD 10/21/2017 Advanced Heart Failure: Corey Champagne, MD 12/05/2011   Patient Profile:   Corey Palmer is a 48 y.o. male with a hx of St Jude ICD w/ gen change, NICM, S-CHF w/ EF 15>>30%, PAF on Eliquis, DM, HTN, bipolar, GERD, OSA not on CPAP, CKD III, who is being seen today for the evaluation of CHF at the request of Corey Palmer.  History of Present Illness:   Corey Palmer was admitted 09/03 with worsening SOB. Cardiology asked to evaluate.   Corey Palmer states that he is gained about 20 pounds in the last week.  He also states that his weight has been stable at 250 pounds.  He states he has been taking Lasix 40 mg twice daily ever since he went to the emergency room in [place] a couple of weeks ago.  9/1 ER visit in [facility] for nausea vomiting and diarrhea, felt to be viral gastroenteritis.  He responded well to medications and admission was not required.  Weight was 252 pounds.  Lasix 40 mg twice daily at that time. 8/22 ER visit in [place] for shoulder pain weight listed is 252 pounds.  No cardiology complaints at that time.   8/11 ER visit for COVID testing, weight is again listed at 252 pounds no respiratory or cardiovascular complaints. 8/1-08/31/2018 admission to [facility] for shortness of breath and right upper quadrant pain.  He was diuresed 7 L and his Lasix was increased to 40 mg twice daily.  Admission weight 264 pounds.  Corey Palmer states that his symptoms were fairly sudden in onset, starting 2 days ago.  He describes increased dyspnea on exertion, orthopnea, and PND.  When his symptoms worsened and he was started to get short of breath with minimal activity, that is when he came to the ER.  He feels much  better now.  He still feels like he has some fluid in his abdomen, but is breathing much better.  He had some chest pain when he was at work, but this resolved when he came to the emergency room, and he has not had any since then.    Past Medical History:  Diagnosis Date  . Anginal pain ([place])   . Anxiety   . Arthritis   . Automatic implantable cardioverter-defibrillator in situ 2007   [employer]   . Bipolar disorder ([place])   . CAD (coronary artery disease)    Cardiac cath (08/19/2000) - Nonobstructive, 40% stenosis of LAD,.   . Cancer [facility]) 2013   benign stomach cancer  . CHF (congestive heart failure) ([place])   . Chondrocalcinosis of right knee 06/11/2013  . Chronic systolic heart failure ([place])    2D echo (51/0258) - Systolic function severely reduced., LV EF 20-25%. Akinesis of apical and anteroseptal mycoardium.  last 2D echo (11/2008 ), LV EF 25-30%, diffuse hypokinesis, and grade 1 diastolic dysfunction.  . Depression   . Diabetes uncomplicated adult-type II    on insulin therapy, a1c 12.9 in 01/2011  . Dilated cardiomyopathy ([place])    status post ICD placement in 2008 c/b tearing at the atrial junction resulting in tamponade and urgent thoracotomy  . Dyslipidemia   . Dysrhythmia   . GERD (gastroesophageal reflux disease)   . Gout, unspecified   . Hepatic steatosis  2011   seen on ultrasound.   . History of kidney stones   . Hx of echocardiogram    Echo (04/2013):  Mild LVH, EF 20-25%, mild LAE, mild to mod RVE with mild reduced RVSF.  Marland Kitchen Hypertension   . Obesity (BMI 30.0-34.9)   . Pacemaker   . Paroxysmal atrial fibrillation (HCC)    on chronic coumadin, goal INR 2-3. status post direct current  . Peptic ulcer   . Shortness of breath   . Sleep apnea    does not have CPAP    Past Surgical History:  Procedure Laterality Date  . CARDIAC CATHETERIZATION    . CARDIAC PACEMAKER PLACEMENT    . CHOLECYSTECTOMY    . COLONOSCOPY    . CORONARY ARTERY BYPASS GRAFT    .  ESOPHAGOGASTRODUODENOSCOPY  04/05/2011   Procedure: ESOPHAGOGASTRODUODENOSCOPY (EGD);  Surgeon: Irene Shipper, MD;  Location: [facility] ENDOSCOPY;  Service: Endoscopy;  Laterality: N/A;  . JOINT REPLACEMENT     left knee replacement  . KNEE ARTHROSCOPY Right 06/11/2013   Procedure: RIGHT KNEE ARTHROSCOPY with chondroplasty;  Surgeon: Johnny Bridge, MD;  Location: [facility];  Service: Orthopedics;  Laterality: Right;  . TOTAL KNEE ARTHROPLASTY Left      Prior to Admission medications   Medication Sig Start Date End Date Taking? Authorizing Provider  apixaban (ELIQUIS) 5 MG TABS tablet Take 1 tablet (5 mg total) by mouth 2 (two) times daily. Patient taking differently: Take 5 mg by mouth at bedtime.  10/21/17  Yes Evans Lance, MD  atorvastatin (LIPITOR) 80 MG tablet Take 1 tablet (80 mg total) by mouth daily. Patient taking differently: Take 80 mg by mouth at bedtime.  10/21/17  Yes Evans Lance, MD  busPIRone (BUSPAR) 10 MG tablet Take 1 tablet (10 mg total) by mouth 3 (three) times daily. Patient taking differently: Take 10-20 mg by mouth 2 (two) times daily as needed (anxiety).  07/13/18 10/11/18 Yes Pucilowski, Olgierd A, MD  carvedilol (COREG) 25 MG tablet Take 1 tablet (25 mg total) by mouth 2 (two) times daily. 09/08/18  Yes Shirley Friar, PA-C  digoxin (LANOXIN) 0.125 MG tablet Take 1 tablet (0.125 mg total) by mouth daily. 10/21/17 10/16/18 Yes Evans Lance, MD  divalproex (DEPAKOTE ER) 500 MG 24 hr tablet Take 1 tablet (500 mg total) by mouth daily. 07/13/18 10/11/18 Yes Pucilowski, Olgierd A, MD  Dulaglutide (TRULICITY) 1.5 XH/3.7JI SOPN Inject 1.5 mg into the skin once a week. Patient taking differently: Inject 1.5 mg into the skin every Sunday.  03/26/18  Yes Renato Shin, MD  fenofibrate (TRICOR) 145 MG tablet Take 145 mg by mouth at bedtime.    Yes [provider]  FLUoxetine (PROZAC) 20 MG capsule Take 4 capsules (80 mg total) by mouth daily. 07/13/18 10/11/18 Yes  Pucilowski, Olgierd A, MD  furosemide (LASIX) 20 MG tablet Take 40 mg by mouth 2 (two) times daily.  08/31/18  Yes [provider]  hydrOXYzine (VISTARIL) 50 MG capsule Take 1 capsule (50 mg total) by mouth 3 (three) times daily as needed for anxiety. 07/13/18 10/11/18 Yes Pucilowski, Olgierd A, MD  Insulin Glargine (LANTUS SOLOSTAR) 100 UNIT/ML Solostar Pen Inject 50 Units into the skin every morning. 07/02/18  Yes Renato Shin, MD  insulin lispro (HUMALOG) 100 UNIT/ML injection Inject 5-10 Units into the skin See admin instructions. Inject depending upon blood sugar reading: base unit is 5 increase by 1 units per 50 blood sugar reading starting at 150.  Yes [provider]  lisinopril (PRINIVIL,ZESTRIL) 20 MG tablet Take 1 tablet (20 mg total) by mouth daily. 10/21/17  Yes Evans Lance, MD  magnesium oxide (MAG-OX) 400 MG tablet Take 400 mg by mouth daily.   Yes [provider]  potassium chloride SA (K-DUR,KLOR-CON) 20 MEQ tablet Take 1 tablet (20 mEq total) by mouth daily. Patient taking differently: Take 20 mEq by mouth at bedtime.  10/21/17 01/24/22 Yes Evans Lance, MD  glucose blood (FREESTYLE PRECISION NEO TEST) test strip 1 strip and 1 lancet, each 10 per day.  Uses Omnipod device. 02/27/18   Renato Shin, MD  omeprazole (PRILOSEC) 40 MG capsule Take 1 capsule (40 mg total) by mouth daily. Patient not taking: Reported on 10/01/2018 08/18/13   Larey Dresser, MD    Inpatient Medications: Scheduled Meds: . apixaban  5 mg Oral BID  . aspirin EC  81 mg Oral Daily  . atorvastatin  80 mg Oral QHS  . busPIRone  10 mg Oral TID  . carvedilol  25 mg Oral BID  . digoxin  0.125 mg Oral Daily  . divalproex  500 mg Oral Daily  . fenofibrate  160 mg Oral QHS  . FLUoxetine  80 mg Oral Daily  . furosemide  40 mg Intravenous Q12H  . insulin aspart  0-15 Units Subcutaneous TID WC  . insulin aspart  0-5 Units Subcutaneous QHS  . insulin glargine  50 Units Subcutaneous  [id number]  . lisinopril  10 mg Oral Daily  . magnesium oxide  400 mg Oral Daily  . potassium chloride  10 mEq Oral Daily  . sodium chloride flush  3 mL Intravenous Once  . sodium chloride flush  3 mL Intravenous Q12H   Continuous Infusions: . sodium chloride     PRN Meds: sodium chloride, acetaminophen, diphenhydrAMINE-zinc acetate, hydrOXYzine, sodium chloride flush  Allergies:    Allergies  Allergen Reactions  . Orange Fruit Anaphylaxis    Per Pt- Blister around lips and Hives all over   . Penicillins Anaphylaxis    Did it involve swelling of the face/tongue/throat, SOB, or low BP? Yes Did it involve sudden or severe rash/hives, skin peeling, or any reaction on the inside of your mouth or nose? Yes Did you need to seek medical attention at a hospital or doctor's office? No When did it last happen?childhood If all above answers are "NO", may proceed with cephalosporin use.  [name] [Empagliflozin] Itching    Social History:   Social History   Socioeconomic History  . Marital status: Married    Spouse name: Not on file  . Number of children: 3  . Years of education: college  . Highest education level: Not on file  Occupational History  . Occupation: bar tender  Social Needs  . Financial resource strain: Not on file  . Food insecurity    Worry: Not on file    Inability: Not on file  . Transportation needs    Medical: Not on file    Non-medical: Not on file  Tobacco Use  . Smoking status: Never Smoker  . Smokeless tobacco: Never Used  Substance and Sexual Activity  . Alcohol use: No  . Drug use: No  . Sexual activity: Yes  Lifestyle  . Physical activity    Days per week: Not on file    Minutes per session: Not on file  . Stress: Not on file  Relationships  . Social Herbalist on phone:  Not on file    Gets together: Not on file    Attends religious service: Not on file    Active member of club or organization: Not on file    Attends meetings  of clubs or organizations: Not on file    Relationship status: Not on file  . Intimate partner violence    Fear of current or ex partner: Not on file    Emotionally abused: Not on file    Physically abused: Not on file    Forced sexual activity: Not on file  Other Topics Concern  . Not on file  Social History Narrative   Lives in [place].   Studied sports medicine in college, got a BS - used to work with the [employer].          Family History:   Family History  Problem Relation Age of Onset  . Diabetes Mother   . Coronary artery disease Mother 54  . Gout Mother   . Heart disease Mother   . Heart attack Mother 58  . Diabetes Father   . Coronary artery disease Father 31       Died in a house fire   . Heart disease Father   . Heart disease Brother    Family Status:  Family Status  Relation Name Status  . Mother  Deceased at age 109  . Father  Deceased at age 65       house fire  . Brother  (Not Specified)    ROS:  Please see the history of present illness.  All other ROS reviewed and negative.     Physical Exam/Data:   Vitals:   10/02/18 0500 10/02/18 0610 10/02/18 0854 10/02/18 0947  BP: 101/83  (!) 120/92 (!) 121/96  Pulse:   78 87  Resp:      Temp:      TempSrc:      SpO2:   99% 97%  Weight:  121.7 kg    Height:        Intake/Output Summary (Last 24 hours) at 10/02/2018 1131 Last data filed at 10/02/2018 0900 Gross per 24 hour  Intake 683 ml  Output 400 ml  Net 283 ml   Filed Weights   10/01/18 1602 10/01/18 2002 10/02/18 0610  Weight: 121.3 kg 121.3 kg 121.7 kg   Body mass index is 33.55 kg/m.  General:  Well nourished, well developed, male in no acute distress HEENT: normal Lymph: no adenopathy Neck: JVD - unable to determine 2nd body habitus Endocrine:  No thryomegaly Vascular: No carotid bruits; 4/4 extremity pulses 2+, without bruits  Cardiac:  normal S1, S2; RRR; no murmur  Lungs:  clear bilaterally, no wheezing, rhonchi or rales  Abd:  soft, nontender, no hepatomegaly  Ext: no edema Musculoskeletal:  No deformities, BUE and BLE strength normal and equal Skin: warm and dry  Neuro:  CNs 2-12 intact, no focal abnormalities noted Psych:  Normal affect   EKG:  The EKG was personally reviewed and demonstrates:  09/04, SR, HR 79, LA enlargement Telemetry:  Telemetry was personally reviewed and demonstrates:  SR   CV studies:   ECHO: 08/31/2018 AT [facility] The left ventricle is severely dilated. There is normal left ventricular wall  thickness. Overall left ventricular function appears to be severely reduced .  LV ejection fraction = 15%. Left ventricular filling pattern is prolonged  relaxation. There is severe global hypokinesis of the left ventricle.  RIGHT VENTRICLE The right ventricle is  normal size. Device lead in the right ventricle. The  right ventricular systolic function is mildly reduced.  LEFT ATRIUM The left atrium is mildly dilated.  RIGHT ATRIUM  Right atrial size is normal. There is no Doppler evidence for a patent  foramen ovale. AORTIC VALVE The aortic valve is trileaflet. There is no aortic stenosis. There is no  aortic regurgitation. MITRAL VALVE The mitral valve leaflets appear normal. There is mild to moderate mitral  regurgitation. No significant mitral valve stenosis. TRICUSPID VALVE Structurally normal tricuspid valve. There is mild tricuspid regurgitation.  Mild pulmonary hypertension. Estimated right ventricular systolic pressure is  40 mmHg. PULMONIC VALVE The pulmonic valve is not well visualized. There is no pulmonic valvular  regurgitation. There is no pulmonic valvular stenosis. ARTERIES The aortic sinus is normal size. EFFUSION There is no pericardial effusion. MMode/2D Measurements & Calculations IVSd: 0.73 cm LVIDd: 7.5 cm LVPWd: 0.88 cm LVIDs: 6.4 cm LA dim: 5.2 cm EDV(MOD-sp4): 315.0 ml IVS/LVPW: 0.84 FS: 14.2 % EDV(Teich): 296.6 ml ESV(Teich):  210.0 ml ESV(MOD-sp4): 272.0 ml EDV(MOD-sp2): 201.0 ml ESV(MOD-sp2): 173.0 ml EDV(cubed): 418.6 ml ESV(cubed): 264.5 ml EF(cubed): 36.8 % LV mass(C)d: 277.9 grams LV mass(C)dI: 113.6 grams/m2 SV(Teich): 86.6 ml SI(Teich): 35.4 ml/m2 SV(cubed): 154.1 ml SI(cubed): 63.0 ml/m2 Ao root area: 9.3 cm2 LA/Ao: 1.5 LVOT diam: 2.1 cm LVOT area(traced): 3.5 cm2 SV(MOD-sp4): 43.0 ml SI(MOD-sp4): 17.6 ml/m2 SV(MOD-sp2): 28.0 ml SI(MOD-sp2): 11.5 ml/m2 Doppler Measurements & Calculations MV E max vel: 113.0 cm/sec MV A max vel: 25.7 cm/sec MV E/A: 4.4 MV dec time: 0.08 sec SV(LVOT): 38.2 ml Ao V2 max: 97.2 cm/sec Ao max PG: 3.8 mmHg Ao max PG (full): 1.4 mmHg Ao V2 mean: 70.5 cm/sec Ao mean PG: 2.2 mmHg Ao mean PG (full): 0.76 mmHg Ao V2 VTI: 13.8 cm AVA (VTI): 2.8 cm2 LV V1 VTI: 11.0 cm SV(Ao): 128.5 ml SI(Ao): 52.6 ml/m2 SI(LVOT): 15.6 ml/m2 TR max vel: 289.3 cm/sec TR max PG: 33.5 mmHg AS Dimensionless Index (VTI): 0.80 AVAi(VTI) cm^2/m^2: 1.1 cm2 SV index(LVOT): 15.6 ml/m2  CATH: 08/19/2000 This revealed a large left main with no significant disease.  The LAD is a large vessel which coursed to the apex and gave rise to two diagonal branches.  The LAD is noted to have mild 40% stenosis in the early mid portion of the LAD, with mild calcification.  The first diagonal is a large vessel with no significant disease.  The second diagonal is a medium sized vessel with no significant disease.  The left circumflex is a medium sized vessel which coursed in the AV groove and gave rise to one large bifurcating obtuse marginal.  There is no significant disease in the AV groove circumflex or OM.  The right coronary artery is a large vessel which is dominant and gives rise to both a PDA, as well as a posterolateral branch.  There is no significant disease in the RCA, PDA, or posterolateral branch.  Left ventriculogram reveals moderately depressed EF of 30-35%, with global  hypokinesis.  Abdominal aortogram reveals no evidence of renal artery stenosis.  HEMODYNAMICS:  Systemic arterial pressure 147/106, LV systemic pressure 147/18, LV EDP of 25.  Following the case, the right femoral site was closed using a Perclose device without complications.  One gram of vancomycin was given prophylactically.  CONCLUSIONS: 1. No significant coronary artery disease. 2. Moderately depressed left ventricular systolic function. 3. No infrarenal artery stenosis. 4. Systemic hypertension.   Laboratory Data:   Chemistry Recent Labs  Lab 10/01/18 1029  10/01/18 1515 10/02/18 0841  NA 134*  --  134*  K 4.2  --  4.8  CL 100  --  100  CO2 25  --  24  GLUCOSE 261*  --  227*  BUN 26*  --  37*  CREATININE 1.47* 1.55* 1.74*  CALCIUM 8.5*  --  8.6*  GFRNONAA 55* 52* 45*  GFRAA >60 >60 52*  ANIONGAP 9  --  10    Lab Results  Component Value Date   ALT 26 03/23/2018   AST 26 03/23/2018   ALKPHOS 73 03/23/2018   BILITOT 0.4 03/23/2018   Hematology Recent Labs  Lab 10/01/18 1029 10/01/18 1602 10/02/18 0841  WBC 7.6 7.1 7.2  RBC 5.07 5.39 5.54  HGB 13.9 14.8 15.3  HCT 43.0 45.2 47.6  MCV 84.8 83.9 85.9  MCH 27.4 27.5 27.6  MCHC 32.3 32.7 32.1  RDW 14.2 14.5 14.5  PLT 219 229 219   Cardiac Enzymes High Sensitivity Troponin:   Recent Labs  Lab 10/01/18 1233 10/01/18 1515 10/01/18 1804  TROPONINIHS 21* 21* 22*     BNP Recent Labs  Lab 10/01/18 1253  BNP 659.0*    TSH:  Lab Results  Component Value Date   TSH 3.009 10/01/2018   Lipids: Lab Results  Component Value Date   CHOL 176 10/01/2018   HDL 25 (L) 10/01/2018   LDLCALC 126 (H) 10/01/2018   LDLDIRECT 116.5 11/01/2011   TRIG 126 10/01/2018   CHOLHDL 7.0 10/01/2018   HgbA1c: Lab Results  Component Value Date   HGBA1C 11.9 (H) 10/01/2018   Magnesium:  Magnesium  Date Value Ref Range Status  10/02/2018 1.9 1.7 - 2.4 mg/dL Final    Comment:    Performed at [facility], [place] [address], [place], [place] [address]     Radiology/Studies:  Dg Chest 2 View  Result Date: 10/01/2018 CLINICAL DATA:  Shortness of breath, chest pain EXAM: CHEST - 2 VIEW COMPARISON:  06/10/2013 FINDINGS: Cardiomegaly status post median sternotomy with left chest single lead pacer defibrillator. Mild diffuse interstitial pulmonary opacity. The visualized skeletal structures are unremarkable. IMPRESSION: Cardiomegaly with mild diffuse interstitial pulmonary opacity, likely edema. No focal airspace opacity. Electronically Signed   By: Eddie Candle M.D.   On: 10/01/2018 10:46    Assessment and Plan:   1. Acute on chronic systolic CHF - quite likely low-flow, low output  -He has not lost any weight since admission, is actually up a pound.  However, BUN and creatinine are higher than on admission. -Intake/output are incomplete -Case discussed with Corey. Meda Coffee. -Will order central line, Coox, and CVP, that will help determine treatment - He has not seen Corey. Aundra Dubin in 7 years. - May need CHF consult to consider additional therapies.  Otherwise, per IM Active Problems:   HYPERTRIGLYCERIDEMIA   OBESITY   Essential hypertension, benign   Ischemic cardiomyopathy   Atrial fibrillation (HCC)   Uncontrolled type 2 diabetes mellitus with hyperglycemia (HCC)   GAD (generalized anxiety disorder)   Acute on chronic systolic CHF (congestive heart failure) ([place])  For questions or updates, please contact [facility] Please consult [email or web address] for contact info under Cardiology/STEMI.   Signed, Rosaria Ferries, PA-C  10/02/2018 11:31 AM  The patient was seen, examined and discussed with Rosaria Ferries, PA-C and I agree with the above.   48 y.o. male with a hx of NICMP (LHC in 2002 with 40% LAD stenosis), with LVEF 10-15% back in 2008, later with  improvement to 25-30%, most recent outside echocardiogram from 09/01/2018 with LVEF 10-15%. He also has h/o ICD placement in 8937 (complicated  by RA perforation and tamponade), generator change while in prison, poorly controlled DM II with peripheral neuropathy, obesity,  PAF on Eliquis, DM, HTN, bipolar disorder, GERD, OSA not on CPAP (currently doesn't own a CPAP), CKD III, who is being seen today for the evaluation of CHF at the request of Corey Palmer. Mr Diekman was seen by Corey Aundra Dubin in 2013, he was lost for follow up (he was incarcerated for some time) until 09/2017 when he saw Corey Lovena Le.   He has had several admission/ER visits in the last 4 weeks: 8/1-08/31/2018 admission to [facility] for shortness of breath --> diuresed 7 L and his Lasix was increased to 40 mg twice daily.  Admission weight 264 pounds. 8/11 ER visit for COVID testing, weight is again listed at 252 pounds no respiratory or cardiovascular complaints. 9/1 ER visit in [facility] for nausea vomiting and diarrhea, felt to be viral gastroenteritis.  He responded well to medications and admission was not required.  Weight was 252 pounds.  Lasix 40 mg twice daily at that time.  09/03 admission for worsening SOB, weight 264, cardiology asked to evaluate. He states that he has gained about 20 pounds in the last week.  He also states that his weight has been stable at 250 pounds. He states that he has been able to work and get his meds and that he was compliant with the, symptoms fairly sudden and progressive over the last week. Positive for PND, SOB at rest, no chest pain, no palpitations, no ICD firing, no syncope. He hasn't been using CPAP as he doesn't have a CPAP machine.,   Physical exam shows obese gentleman in NAD, but with some difficulties with breathing while talking. He has a thick neck and I am unable to assess his JVDs. D4,2 with 3/6 systolic murmur, minimal rales at lung bases, extremities rather cold, palpable pulses.   A/P Non-ischemic cardiomyopathy - probably familial dilated CMP given the fact that two siblings and both parents had dg of CMP - ? Compliance, the  patient is a difficult historian, not sure what is true and what not, claims that he saw Corey Aundra Dubin few months ago, ? Compliance with his meds - per patient functional class NYHA II few months ago - currently suspicion for a low cardiac output, no significant fluid overload on physical exam but 14 lbs above baseline,and worsening Crea of 1.7, baseline 1.1-1.5 - we will obtain  A central line, obtain CVP and Coox and decide on further therapies - increase lasix to 80 mg iv BID - continue lisinopril 10 mg po daily, carvedilol 25 mg po BID, digoxin - obtain echocardiogram - he should be on Entresto - however with poor compliance I will not start yet.  - if Coox abnormal, we will ask CHF service for a consult, not sure what is long term plan given difficult socioeconomic status, underlying mental disease and ? compliance with therapy  PAF - currently in SR - on Eliquis 5 mg PO BID  Hypertension - controlled  HLP  - on atorvastatin  DM  - poorly controlled  OSA - he should be referred to our clinic at discharge  Ena Dawley, MD 10/02/2018

## 2018-10-02 NOTE — Evaluation (Signed)
Physical Therapy Evaluation Patient Details Name: Corey Palmer MRN: [id number] DOB: 1970/03/27 Today's Date: 10/02/2018   History of Present Illness  48 y.o. male with admitted with SOB and CHF exacerbation. PMhx: CHF with EF 15%, pacemaker since he was 48 year old, HTN, HLD, diabetes, obesity, anxiety, PTSD, CAD, bipolar, gout, A. fib  Clinical Impression  Pt sitting in bed with HOB 40 degrees on arrival reporting fatigue from decreased sleep. Pt with 6/10 CP at rest with HR 84, spO2 91% on RA and with activity HR increased to 8/10 ceasing mobility with HR 87, SpO2 97% on RA and bP after gait 120/96. PT currently with limited activity tolerance due to chest pain with RN aware. PT will benefit from acute therapy to maximize mobility and independence for safe return home with family. Pt encouraged to continue mobility acutely.      Follow Up Recommendations No PT follow up    Equipment Recommendations  None recommended by PT    Recommendations for Other Services       Precautions / Restrictions Precautions Precautions: None      Mobility  Bed Mobility Overal bed mobility: Modified Independent                Transfers Overall transfer level: Independent                  Ambulation/Gait Ambulation/Gait assistance: Independent Gait Distance (Feet): 250 Feet Assistive device: None Gait Pattern/deviations: Step-through pattern;Decreased stride length   Gait velocity interpretation: 1.31 - 2.62 ft/sec, indicative of limited community ambulator General Gait Details: pt with slow steady gait limited by chest pain with VSS throughout. Pt reports 7/10 CP with increase to 8/10 causing ceasing of gait. RN aware of all  Stairs            Wheelchair Mobility    Modified Rankin (Stroke Patients Only)       Balance Overall balance assessment: No apparent balance deficits (not formally assessed)                                            Pertinent Vitals/Pain Pain Assessment: 0-10 Pain Score: 7  Pain Location: diaphragm Pain Descriptors / Indicators: Stabbing Pain Intervention(s): Limited activity within patient's tolerance;Monitored during session;Repositioned    Home Living Family/patient expects to be discharged to:: Private residence Living Arrangements: Children;Other relatives;Spouse/significant other Available Help at Discharge: Family;Available 24 hours/day Type of Home: House Home Access: Level entry     Home Layout: Two level;Able to live on main level with bedroom/bathroom Home Equipment: None      Prior Function Level of Independence: Independent         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 Extremity Assessment Upper Extremity Assessment: Overall WFL for tasks assessed    Lower Extremity Assessment Lower Extremity Assessment: Overall WFL for tasks assessed    Cervical / Trunk Assessment Cervical / Trunk Assessment: Normal  Communication   Communication: No difficulties  Cognition Arousal/Alertness: Awake/alert Behavior During Therapy: WFL for tasks assessed/performed Overall Cognitive Status: Within Functional Limits for tasks assessed  General Comments      Exercises     Assessment/Plan    PT Assessment Patient needs continued PT services  PT Problem List Decreased activity tolerance       PT Treatment Interventions Gait training;Patient/family education;Therapeutic activities;Stair training;Functional mobility training    PT Goals (Current goals can be found in the Care Plan section)  Acute Rehab PT Goals Patient Stated Goal[place] Goal Formulation: With patient Time For Goal Achievement: 10/09/18 Potential to Achieve Goals: Good    Frequency Min 3X/week   Barriers to discharge         Co-evaluation               AM-PAC PT "6 Clicks" Mobility  Outcome Measure Help needed turning from your back to your side while in a flat bed without using bedrails?: None Help needed moving from lying on your back to sitting on the side of a flat bed without using bedrails?: None Help needed moving to and from a bed to a chair (including a wheelchair)?: None Help needed standing up from a chair using your arms (e.g., wheelchair or bedside chair)?: None Help needed to walk in hospital room?: A Little Help needed climbing 3-5 steps with a railing? : A Little 6 Click Score: 22    End of Session   Activity Tolerance: Patient tolerated treatment well Patient left: in chair;with nursing/sitter in room;with call bell/phone within reach Nurse Communication: Mobility status PT Visit Diagnosis: Other abnormalities of gait and mobility (R26.89)    Time: 6063-0160 PT Time Calculation (min) (ACUTE ONLY): 24 min   Charges:   PT Evaluation $PT Eval Moderate Complexity: 1 Mod          [place], PT Acute Rehabilitation Services Pager: [phone] Office: Maybee B Yaritzi Craun 10/02/2018, 10:14 AM

## 2018-10-02 NOTE — Progress Notes (Addendum)
PROGRESS NOTE    CLINE DRAHEIM  LGX:[id number] DOB: Aug 16, 1970 DOA: 10/01/2018 PCP: London Pepper, MD  Brief Narrative: 48 year old male with history of chronic systolic CHF/NICM, EF of 81%, ICD, type 2 diabetes mellitus, stage III CKD,  paroxysmal atrial fibrillation on Eliquis, obesity, hypertension, dyslipidemia, anxiety presented to the ER with worsening shortness of breath yesterday   Assessment & Plan:   Acute on chronic systolic CHF/NICM, EF of 44% -Volume status improving -Continue IV Lasix today, monitor I's/O, daily weights -Continue Coreg lisinopril and digoxin -concern for possible low output state, weight much higher now from baseline -Will request cardiology input as well  Stage III chronic kidney disease -Baseline creatinine around 1.5, stable, monitor with diuresis and ACE inhibitor  Type 2 diabetes mellitus -Uncontrolled, last hemoglobin A1c was greater than 10, continue Lantus 50 units and sliding scale insulin  Paroxysmal atrial fibrillation -In sinus rhythm now, continue Coreg and Eliquis  Obesity, BMI is 33  Generalized anxiety/PTSD -Continue Prozac, Depakote, BuSpar  GERD -Continue PPI  OSA -Continue CPAP nightly  DVT prophylaxis: Eliquis Code Status: Full code Family Communication: No family at bedside Disposition Plan: Home pending clinical improvement, diuresis  Consultants:   Cardiology   Procedures:   Antimicrobials:    Subjective: -Breathing little better, not back to baseline yet  Objective: Vitals:   10/02/18 0500 10/02/18 0610 10/02/18 0854 10/02/18 0947  BP: 101/83  (!) 120/92 (!) 121/96  Pulse:   78 87  Resp:      Temp:      TempSrc:      SpO2:   99% 97%  Weight:  121.7 kg    Height:        Intake/Output Summary (Last 24 hours) at 10/02/2018 1106 Last data filed at 10/02/2018 0900 Gross per 24 hour  Intake 683 ml  Output 400 ml  Net 283 ml   Filed Weights   10/01/18 1602 10/01/18 2002 10/02/18 0610  Weight: 121.3  kg 121.3 kg 121.7 kg    Examination:  General exam: Obese male sitting up in bed with CPAP on, AAO x3, no distress Respiratory system: Basilar crackles, otherwise clear Cardiovascular system: S1 & S2 heard, RRR.  Gastrointestinal system: Abdomen is nondistended, soft and nontender.Normal bowel sounds heard. Central nervous system: Alert and oriented. No focal neurological deficits. Extremities: Trace edema Skin: No rashes, lesions or ulcers Psychiatry: Judgement and insight appear normal. Mood & affect appropriate.     Data Reviewed:   CBC: Recent Labs  Lab 10/01/18 1029 10/01/18 1602 10/02/18 0841  WBC 7.6 7.1 7.2  HGB 13.9 14.8 15.3  HCT 43.0 45.2 47.6  MCV 84.8 83.9 85.9  PLT 219 229 818   Basic Metabolic Panel: Recent Labs  Lab 10/01/18 1029 10/01/18 1515 10/02/18 0841  NA 134*  --  134*  K 4.2  --  4.8  CL 100  --  100  CO2 25  --  24  GLUCOSE 261*  --  227*  BUN 26*  --  37*  CREATININE 1.47* 1.55* 1.74*  CALCIUM 8.5*  --  8.6*  MG  --  1.7 1.9  PHOS  --  5.0*  --    GFR: Estimated Creatinine Clearance: 72.2 mL/min (A) (by C-G formula based on SCr of 1.74 mg/dL (H)). Liver Function Tests: No results for input(s): AST, ALT, ALKPHOS, BILITOT, PROT, ALBUMIN in the last 168 hours. Recent Labs  Lab 10/01/18 1233  LIPASE 37   No results for input(s): AMMONIA in the  last 168 hours. Coagulation Profile: No results for input(s): INR, PROTIME in the last 168 hours. Cardiac Enzymes: No results for input(s): CKTOTAL, CKMB, CKMBINDEX, TROPONINI in the last 168 hours. BNP (last 3 results) No results for input(s): PROBNP in the last 8760 hours. HbA1C: Recent Labs    10/01/18 1602  HGBA1C 11.9*   CBG: Recent Labs  Lab 10/01/18 1630 10/01/18 2140 10/02/18 0140 10/02/18 0654  GLUCAP 230* 243* 202* 225*   Lipid Profile: Recent Labs    10/01/18 1515  CHOL 176  HDL 25*  LDLCALC 126*  TRIG 126  CHOLHDL 7.0   Thyroid Function Tests: Recent Labs     10/01/18 1602  TSH 3.009   Anemia Panel: No results for input(s): VITAMINB12, FOLATE, FERRITIN, TIBC, IRON, RETICCTPCT in the last 72 hours. Urine analysis:    Component Value Date/Time   COLORURINE YELLOW 10/01/2018 [place] 10/01/2018 1345   LABSPEC 1.012 10/01/2018 1345   PHURINE 5.0 10/01/2018 1345   GLUCOSEU NEGATIVE 10/01/2018 1345   HGBUR NEGATIVE 10/01/2018 1345   BILIRUBINUR NEGATIVE 10/01/2018 1345   KETONESUR NEGATIVE 10/01/2018 1345   PROTEINUR 100 (A) 10/01/2018 1345   UROBILINOGEN 1.0 10/14/2011 1924   NITRITE NEGATIVE 10/01/2018 1345   LEUKOCYTESUR NEGATIVE 10/01/2018 1345   Sepsis Labs: @LABRCNTIP (procalcitonin:4,lacticidven:4)  ) Recent Results (from the past 240 hour(s))  SARS CORONAVIRUS 2 (TAT 6-24 HRS) Nasopharyngeal Nasopharyngeal Swab     Status: None   Collection Time: 10/01/18  3:01 PM   Specimen: Nasopharyngeal Swab  Result Value Ref Range Status   SARS Coronavirus 2 NEGATIVE NEGATIVE Final    Comment: (NOTE) SARS-CoV-2 target nucleic acids are NOT DETECTED. The SARS-CoV-2 RNA is generally detectable in upper and lower respiratory specimens during the acute phase of infection. Negative results do not preclude SARS-CoV-2 infection, do not rule out co-infections with other pathogens, and should not be used as the sole basis for treatment or other patient management decisions. Negative results must be combined with clinical observations, patient history, and epidemiological information. The expected result is Negative. Fact Sheet for Patients: SugarRoll.be Fact Sheet for Healthcare Providers: https://www.woods-mathews.com/ This test is not yet approved or cleared by the [place] FDA and  has been authorized for detection and/or diagnosis of SARS-CoV-2 by FDA under an Emergency Use Authorization (EUA). This EUA will remain  in effect (meaning this test can be used) for the duration  of the COVID-19 declaration under Section 56 4(b)(1) of the Act, 21 U.S.C. section 360bbb-3(b)(1), unless the authorization is terminated or revoked sooner. Performed at [facility], [place] [address] [place], [place] [address]          Radiology Studies: Dg Chest 2 View  Result Date: 10/01/2018 CLINICAL DATA:  Shortness of breath, chest pain EXAM: CHEST - 2 VIEW COMPARISON:  06/10/2013 FINDINGS: Cardiomegaly status post median sternotomy with left chest single lead pacer defibrillator. Mild diffuse interstitial pulmonary opacity. The visualized skeletal structures are unremarkable. IMPRESSION: Cardiomegaly with mild diffuse interstitial pulmonary opacity, likely edema. No focal airspace opacity. Electronically Signed   By: Eddie Candle M.D.   On: 10/01/2018 10:46        Scheduled Meds: . apixaban  5 mg Oral BID  . aspirin EC  81 mg Oral Daily  . atorvastatin  80 mg Oral QHS  . busPIRone  10 mg Oral TID  . carvedilol  25 mg Oral BID  . digoxin  0.125 mg Oral Daily  . divalproex  500  mg Oral Daily  . fenofibrate  160 mg Oral QHS  . FLUoxetine  80 mg Oral Daily  . furosemide  40 mg Intravenous Q12H  . insulin aspart  0-15 Units Subcutaneous TID WC  . insulin aspart  0-5 Units Subcutaneous QHS  . insulin glargine  50 Units Subcutaneous BH-q7a  . lisinopril  10 mg Oral Daily  . magnesium oxide  400 mg Oral Daily  . potassium chloride  10 mEq Oral Daily  . sodium chloride flush  3 mL Intravenous Once  . sodium chloride flush  3 mL Intravenous Q12H   Continuous Infusions: . sodium chloride       LOS: 0 days    Time spent: 11min    Domenic Polite, MD [facility]  10/02/2018, 11:06 AM

## 2018-10-02 NOTE — Progress Notes (Signed)
Pt complained having chest pain 8/10 in the middle chest area. 0.4mg  nitroglycerin was given, EKG done. The, patient stated pain still at 7/10, but refused the second dose Nitro

## 2018-10-02 NOTE — Progress Notes (Signed)
Patient has CVP monitoring ordered. Arrived to the unit without PICC line. Orders for IR to place PICC line. IR leaves at 5pm. Cardiology notified. Dr. Meda Coffee came to the unit and spoke to RN. RN made Dr. Meda Coffee aware of situation. Dr. Meda Coffee has placed new orders for IV team to place PICC line. Awaiting IV team to place PICC line at this time.Pateint currently resting in bed. No signs or symptoms of acute distress noted. Nursing will continue to monitor.

## 2018-10-02 NOTE — Progress Notes (Addendum)
Pt had a episode of N/V, pt received earlier Diluadid and GI cocktail for epigastric pain, after these meds, pt became very N and vomitted.  Pt had a prolonged QTC, so was not able to receive antiemetic, MD ordered 1 x order of Ativan IV, helped his N/V but made him extremely sleepy and RN noticed that pt had some severe sleep apnea, RN put pt on 4L O2 which did help shortly after, but pt would benefit more on c-pap, pt does not wear c-pap at home;  RN notified Rapid Response to assess;   MD ordered c-pap at night, which helped pt's respiratory status, will continue to monitor, Thanks Arvella Nigh RN.

## 2018-10-03 LAB — GLUCOSE, CAPILLARY
Glucose-Capillary: 154 mg/dL — ABNORMAL HIGH (ref 70–99)
Glucose-Capillary: 111 mg/dL — ABNORMAL HIGH (ref 70–99)
Glucose-Capillary: 245 mg/dL — ABNORMAL HIGH (ref 70–99)
Glucose-Capillary: 169 mg/dL — ABNORMAL HIGH (ref 70–99)

## 2018-10-03 LAB — COOXEMETRY PANEL
Carboxyhemoglobin: 1.6 % — ABNORMAL HIGH (ref 0.5–1.5)
Methemoglobin: 1 % (ref 0.0–1.5)
O2 Saturation: 57.6 %
Total hemoglobin: 14.3 g/dL (ref 12.0–16.0)

## 2018-10-03 LAB — BASIC METABOLIC PANEL
Anion gap: 10 (ref 5–15)
BUN: 40 mg/dL — ABNORMAL HIGH (ref 6–20)
CO2: 25 mmol/L (ref 22–32)
Calcium: 8.2 mg/dL — ABNORMAL LOW (ref 8.9–10.3)
Chloride: 100 mmol/L (ref 98–111)
Creatinine, Ser: 1.77 mg/dL — ABNORMAL HIGH (ref 0.61–1.24)
GFR calc Af Amer: 51 mL/min — ABNORMAL LOW (ref >60)
GFR calc non Af Amer: 44 mL/min — ABNORMAL LOW (ref >60)
Glucose, Bld: 185 mg/dL — ABNORMAL HIGH (ref 70–99)
Potassium: 4.2 mmol/L (ref 3.5–5.1)
Sodium: 135 mmol/L (ref 135–145)

## 2018-10-03 MED ORDER — SODIUM CHLORIDE 0.9% FLUSH
10.0000 mL | Freq: Two times a day (BID) | INTRAVENOUS | Status: DC
  Administered 2018-10-03 – 2018-10-06 (×6): 10 mL

## 2018-10-03 MED ORDER — SODIUM CHLORIDE 0.9% FLUSH
10.0000 mL | INTRAVENOUS | Status: DC | PRN
  Administered 2018-10-04: 20 mL
  Filled 2018-10-03: qty 40

## 2018-10-03 NOTE — Progress Notes (Signed)
PROGRESS NOTE    Corey Palmer  GBE:[id number] DOB: 11-02-1970 DOA: 10/01/2018 PCP: London Pepper, MD  Brief Narrative: 48 year old male with history of chronic systolic CHF/NICM, EF of 75%, ICD, type 2 diabetes mellitus, stage III CKD,  paroxysmal atrial fibrillation on Eliquis, obesity, hypertension, dyslipidemia, anxiety presented to the ER with worsening shortness of breath. -Weight found to be 15 pounds above his dry weight -Admitted with acute on chronic systolic CHF, cardiology following   Assessment & Plan:   Acute on chronic systolic CHF/NICM, EF of 88% -Clinically improving with diuresis, I/Os inaccurate -Surprisingly weight is up too, also likely inaccurate -Continue IV Lasix per cardiology, concern for low output state -Continue Coreg lisinopril and digoxin -Monitor Bmet closely, creatinine stable  Stage III chronic kidney disease -Baseline creatinine around 1.5, stable, monitor with diuresis and ACE inhibitor  Type 2 diabetes mellitus -Uncontrolled, last hemoglobin A1c was greater than 10,  -continue Lantus 50 units and sliding scale insulin, CBGs improving  Paroxysmal atrial fibrillation -In sinus rhythm now, continue Coreg and Eliquis  Obesity, BMI is 33  Generalized anxiety/PTSD -Continue Prozac, Depakote, BuSpar  GERD -Continue PPI  OSA -Continue CPAP nightly  DVT prophylaxis: Eliquis Code Status: Full code Family Communication: No family at bedside Disposition Plan: Home pending clinical improvement, diuresis  Consultants:   Cardiology   Procedures:   Antimicrobials:    Subjective: -Surprised that his weight is up to 272 this morning, feels like he is overall breathing better, not back to baseline yet  Objective: Vitals:   10/03/18 0400 10/03/18 0949 10/03/18 1206 10/03/18 1236  BP:  101/62 109/84   Pulse:  78 81 78  Resp:  16 20   Temp:  98.1 F (36.7 C) 98 F (36.7 C)   TempSrc:  Oral Oral   SpO2:  95% 95%   Weight: 123.5 kg      Height:        Intake/Output Summary (Last 24 hours) at 10/03/2018 1343 Last data filed at 10/03/2018 1300 Gross per 24 hour  Intake 63 ml  Output 2525 ml  Net -2462 ml   Filed Weights   10/01/18 2002 10/02/18 0610 10/03/18 0400  Weight: 121.3 kg 121.7 kg 123.5 kg    Examination:  Gen: Obese male laying in bed with CPAP on, AAO x3, no distress HEENT: PERRLA, Neck supple, no JVD Lungs: Decreased breath sounds at bases otherwise clear CVS: RRR,No Gallops,Rubs or new Murmurs Abd: soft, Non tender, non distended, BS present Extremities: Trace edema Skin: no new rashes  Data Reviewed:   CBC: Recent Labs  Lab 10/01/18 1029 10/01/18 1602 10/02/18 0841  WBC 7.6 7.1 7.2  HGB 13.9 14.8 15.3  HCT 43.0 45.2 47.6  MCV 84.8 83.9 85.9  PLT 219 229 325   Basic Metabolic Panel: Recent Labs  Lab 10/01/18 1029 10/01/18 1515 10/02/18 0841 10/03/18 0300  NA 134*  --  134* 135  K 4.2  --  4.8 4.2  CL 100  --  100 100  CO2 25  --  24 25  GLUCOSE 261*  --  227* 185*  BUN 26*  --  37* 40*  CREATININE 1.47* 1.55* 1.74* 1.77*  CALCIUM 8.5*  --  8.6* 8.2*  MG  --  1.7 1.9  --   PHOS  --  5.0*  --   --    GFR: Estimated Creatinine Clearance: 71.5 mL/min (A) (by C-G formula based on SCr of 1.77 mg/dL (H)). Liver Function Tests: No results  for input(s): AST, ALT, ALKPHOS, BILITOT, PROT, ALBUMIN in the last 168 hours. Recent Labs  Lab 10/01/18 1233  LIPASE 37   No results for input(s): AMMONIA in the last 168 hours. Coagulation Profile: No results for input(s): INR, PROTIME in the last 168 hours. Cardiac Enzymes: No results for input(s): CKTOTAL, CKMB, CKMBINDEX, TROPONINI in the last 168 hours. BNP (last 3 results) No results for input(s): PROBNP in the last 8760 hours. HbA1C: Recent Labs    10/01/18 1602  HGBA1C 11.9*   CBG: Recent Labs  Lab 10/02/18 1205 10/02/18 1635 10/02/18 2114 10/03/18 0638 10/03/18 1209  GLUCAP 231* 205* 206* 154* 111*   Lipid Profile:  Recent Labs    10/01/18 1515  CHOL 176  HDL 25*  LDLCALC 126*  TRIG 126  CHOLHDL 7.0   Thyroid Function Tests: Recent Labs    10/01/18 1602  TSH 3.009   Anemia Panel: No results for input(s): VITAMINB12, FOLATE, FERRITIN, TIBC, IRON, RETICCTPCT in the last 72 hours. Urine analysis:    Component Value Date/Time   COLORURINE YELLOW 10/01/2018 [place] 10/01/2018 1345   LABSPEC 1.012 10/01/2018 1345   PHURINE 5.0 10/01/2018 1345   GLUCOSEU NEGATIVE 10/01/2018 1345   HGBUR NEGATIVE 10/01/2018 1345   BILIRUBINUR NEGATIVE 10/01/2018 1345   KETONESUR NEGATIVE 10/01/2018 1345   PROTEINUR 100 (A) 10/01/2018 1345   UROBILINOGEN 1.0 10/14/2011 1924   NITRITE NEGATIVE 10/01/2018 1345   LEUKOCYTESUR NEGATIVE 10/01/2018 1345   Sepsis Labs: @LABRCNTIP (procalcitonin:4,lacticidven:4)  ) Recent Results (from the past 240 hour(s))  SARS CORONAVIRUS 2 (TAT 6-24 HRS) Nasopharyngeal Nasopharyngeal Swab     Status: None   Collection Time: 10/01/18  3:01 PM   Specimen: Nasopharyngeal Swab  Result Value Ref Range Status   SARS Coronavirus 2 NEGATIVE NEGATIVE Final    Comment: (NOTE) SARS-CoV-2 target nucleic acids are NOT DETECTED. The SARS-CoV-2 RNA is generally detectable in upper and lower respiratory specimens during the acute phase of infection. Negative results do not preclude SARS-CoV-2 infection, do not rule out co-infections with other pathogens, and should not be used as the sole basis for treatment or other patient management decisions. Negative results must be combined with clinical observations, patient history, and epidemiological information. The expected result is Negative. Fact Sheet for Patients: SugarRoll.be Fact Sheet for Healthcare Providers: https://www.woods-mathews.com/ This test is not yet approved or cleared by the [place] FDA and  has been authorized for detection and/or diagnosis of SARS-CoV-2  by FDA under an Emergency Use Authorization (EUA). This EUA will remain  in effect (meaning this test can be used) for the duration of the COVID-19 declaration under Section 56 4(b)(1) of the Act, 21 U.S.C. section 360bbb-3(b)(1), unless the authorization is terminated or revoked sooner. Performed at [facility], [place] [address] [place], [place] [address]          Radiology Studies: [place] Ekg Site Rite  Result Date: 10/02/2018 If [facility] image not attached, placement could not be confirmed due to current cardiac rhythm.       Scheduled Meds: . apixaban  5 mg Oral BID  . aspirin EC  81 mg Oral Daily  . atorvastatin  80 mg Oral QHS  . busPIRone  10 mg Oral TID  . carvedilol  25 mg Oral BID  . digoxin  0.125 mg Oral Daily  . divalproex  500 mg Oral Daily  . fenofibrate  160 mg Oral QHS  . FLUoxetine  80 mg Oral Daily  .  furosemide  40 mg Intravenous Q12H  . insulin aspart  0-15 Units Subcutaneous TID WC  . insulin aspart  0-5 Units Subcutaneous QHS  . insulin glargine  50 Units Subcutaneous BH-q7a  . lisinopril  10 mg Oral Daily  . magnesium oxide  400 mg Oral Daily  . potassium chloride  10 mEq Oral Daily  . sodium chloride flush  10-40 mL Intracatheter Q12H  . sodium chloride flush  3 mL Intravenous Once  . sodium chloride flush  3 mL Intravenous Q12H   Continuous Infusions: . sodium chloride       LOS: 1 day    Time spent: 71min    Domenic Polite, MD [facility]  10/03/2018, 1:43 PM

## 2018-10-03 NOTE — Progress Notes (Signed)
Progress Note  Patient Name: Corey Palmer Date of Encounter: 10/03/2018  Primary Cardiologist:   No primary care provider on file.   Subjective   He thinks that he is breathing a little better.  Not at baseline.    Inpatient Medications    Scheduled Meds: . apixaban  5 mg Oral BID  . aspirin EC  81 mg Oral Daily  . atorvastatin  80 mg Oral QHS  . busPIRone  10 mg Oral TID  . carvedilol  25 mg Oral BID  . digoxin  0.125 mg Oral Daily  . divalproex  500 mg Oral Daily  . fenofibrate  160 mg Oral QHS  . FLUoxetine  80 mg Oral Daily  . furosemide  40 mg Intravenous Q12H  . insulin aspart  0-15 Units Subcutaneous TID WC  . insulin aspart  0-5 Units Subcutaneous QHS  . insulin glargine  50 Units Subcutaneous BH-q7a  . lisinopril  10 mg Oral Daily  . magnesium oxide  400 mg Oral Daily  . potassium chloride  10 mEq Oral Daily  . sodium chloride flush  3 mL Intravenous Once  . sodium chloride flush  3 mL Intravenous Q12H   Continuous Infusions: . sodium chloride     PRN Meds: sodium chloride, acetaminophen, diphenhydrAMINE-zinc acetate, hydrOXYzine, nitroGLYCERIN, sodium chloride flush   Vital Signs    Vitals:   10/03/18 0000 10/03/18 0356 10/03/18 0400 10/03/18 0949  BP: 109/78 110/87  101/62  Pulse: 79 76  78  Resp: 17 19  16   Temp: 97.9 F (36.6 C) 98 F (36.7 C)  98.1 F (36.7 C)  TempSrc: Oral Oral  Oral  SpO2: 99% 96%  95%  Weight:   123.5 kg   Height:        Intake/Output Summary (Last 24 hours) at 10/03/2018 1024 Last data filed at 10/03/2018 0735 Gross per 24 hour  Intake 0 ml  Output 2050 ml  Net -2050 ml   Filed Weights   10/01/18 2002 10/02/18 0610 10/03/18 0400  Weight: 121.3 kg 121.7 kg 123.5 kg    Telemetry    NSR - Personally Reviewed  ECG    NA - Personally Reviewed  Physical Exam   GEN: No acute distress.   Neck: No  JVD Cardiac: RRR, no murmurs, rubs, or gallops.  Respiratory: Clear  to auscultation bilaterally. GI: Soft,  nontender, non-distended  MS: Mild/mod edema; No deformity. Neuro:  Nonfocal  Psych: Normal affect   Labs    Chemistry Recent Labs  Lab 10/01/18 1029 10/01/18 1515 10/02/18 0841 10/03/18 0300  NA 134*  --  134* 135  K 4.2  --  4.8 4.2  CL 100  --  100 100  CO2 25  --  24 25  GLUCOSE 261*  --  227* 185*  BUN 26*  --  37* 40*  CREATININE 1.47* 1.55* 1.74* 1.77*  CALCIUM 8.5*  --  8.6* 8.2*  GFRNONAA 55* 52* 45* 44*  GFRAA >60 >60 52* 51*  ANIONGAP 9  --  10 10     Hematology Recent Labs  Lab 10/01/18 1029 10/01/18 1602 10/02/18 0841  WBC 7.6 7.1 7.2  RBC 5.07 5.39 5.54  HGB 13.9 14.8 15.3  HCT 43.0 45.2 47.6  MCV 84.8 83.9 85.9  MCH 27.4 27.5 27.6  MCHC 32.3 32.7 32.1  RDW 14.2 14.5 14.5  PLT 219 229 219    Cardiac EnzymesNo results for input(s): TROPONINI in the last 168 hours.  No results for input(s): TROPIPOC in the last 168 hours.   BNP Recent Labs  Lab 10/01/18 1253  BNP 659.0*     DDimer No results for input(s): DDIMER in the last 168 hours.   Radiology    Dg Chest 2 View  Result Date: 10/01/2018 CLINICAL DATA:  Shortness of breath, chest pain EXAM: CHEST - 2 VIEW COMPARISON:  06/10/2013 FINDINGS: Cardiomegaly status post median sternotomy with left chest single lead pacer defibrillator. Mild diffuse interstitial pulmonary opacity. The visualized skeletal structures are unremarkable. IMPRESSION: Cardiomegaly with mild diffuse interstitial pulmonary opacity, likely edema. No focal airspace opacity. Electronically Signed   By: Eddie Candle M.D.   On: 10/01/2018 10:46   [place] Ekg Site Rite  Result Date: 10/02/2018 If Site Rite image not attached, placement could not be confirmed due to current cardiac rhythm.   Cardiac Studies   NA:  Echo pending.   Patient Profile     48 y.o. male with with a hx of NICMP (LHC in 2002 with 40% LAD stenosis), with LVEF 10-15% back in 2008, later with improvement to 25-30%, most recent outside echocardiogram from  09/01/2018 with LVEF 10-15%. He also has h/o ICD placement in 8299 (complicated by RA perforation and tamponade), generator change while in prison, poorly controlled DM II with peripheral neuropathy, obesity,  PAF on Eliquis, DM, HTN, bipolar disorder, GERD, OSA not on CPAP (currently doesn't own a CPAP), CKD III, who is being seen today for the evaluation of CHF at the request of Dr Broadus John. Corey Palmer was seen by Dr Aundra Dubin in 2013, he was lost for follow up (he was incarcerated for some time) until 09/2017 when he saw Dr Lovena Le.   Assessment & Plan    ACUTE ON CHRONIC SYSTOLIC HF:   Intake and output somewhat incomplete.  Looks like out put is about 1 liter net negative.   We are awaiting a PICC line for further monitoring.  Continue IV Lasix at current dose for now.   HTN:  This is being managed in the context of treating his CHF  ATRIAL FIB:   NSR .  No change in therapy.    CKD IV:  Creat is stable.    For questions or updates, please contact [place] Please consult [email or web address] for contact info under Cardiology/STEMI.   Signed, Minus Breeding, MD  10/03/2018, 10:24 AM

## 2018-10-03 NOTE — Progress Notes (Signed)
Peripherally Inserted Central Catheter/Midline Placement  The IV Nurse has discussed with the patient and/or persons authorized to consent for the patient, the purpose of this procedure and the potential benefits and risks involved with this procedure.  The benefits include less needle sticks, lab draws from the catheter, and the patient may be discharged home with the catheter. Risks include, but not limited to, infection, bleeding, blood clot (thrombus formation), and puncture of an artery; nerve damage and irregular heartbeat and possibility to perform a PICC exchange if needed/ordered by physician.  Alternatives to this procedure were also discussed.  Bard Power PICC patient education guide, fact sheet on infection prevention and patient information card has been provided to patient /or left at bedside.    PICC/Midline Placement Documentation  PICC Double Lumen 10/03/18 PICC Right Brachial 41 cm 0 cm (Active)  Indication for Insertion or Continuance of Line Vasoactive infusions 10/03/18 1154  Exposed Catheter (cm) 0 cm 10/03/18 1154  Site Assessment Clean;Dry;Intact 10/03/18 1154  Lumen #1 Status Flushed;Saline locked;Blood return noted 10/03/18 1154  Lumen #2 Status Saline locked;Flushed;Blood return noted 10/03/18 1154  Dressing Type Transparent 10/03/18 1154  Dressing Status Dry;Clean;Intact;Antimicrobial disc in place 10/03/18 1154  Dressing Change Due 10/10/18 10/03/18 1154       Gordan Payment 10/03/2018, 11:55 AM

## 2018-10-03 NOTE — Progress Notes (Signed)
CARDIAC REHAB PHASE I   PRE:  Rate/Rhythm: 79 SR  BP:  Supine:   Sitting: 101/75  Standing:    SaO2: 95%RA  MODE:  Ambulation: 400 ft   POST:  Rate/Rhythm: 88 SR  BP:  Supine:   Sitting: 108/81  Standing:    SaO2: 98%RA 1350-1432 Pt walked 400 ft on RA with steady gait and no CP. Tolerated well. No DOE noted. Reviewed daily weights, 2000 mg sodium restriction, 2L FR and signs/symptoms of when to call MD. Pt voiced understanding. Has low sodium diets. May need reinforcement. Pt stated he has scales at home.    Graylon Good, RN BSN  10/03/2018 2:27 PM

## 2018-10-04 DIAGNOSIS — E1165 Type 2 diabetes mellitus with hyperglycemia: Secondary | ICD-10-CM

## 2018-10-04 DIAGNOSIS — E781 Pure hyperglyceridemia: Secondary | ICD-10-CM

## 2018-10-04 DIAGNOSIS — F411 Generalized anxiety disorder: Secondary | ICD-10-CM

## 2018-10-04 LAB — GLUCOSE, CAPILLARY
Glucose-Capillary: 80 mg/dL (ref 70–99)
Glucose-Capillary: 318 mg/dL — ABNORMAL HIGH (ref 70–99)
Glucose-Capillary: 150 mg/dL — ABNORMAL HIGH (ref 70–99)
Glucose-Capillary: 299 mg/dL — ABNORMAL HIGH (ref 70–99)

## 2018-10-04 LAB — BASIC METABOLIC PANEL
Anion gap: 8 (ref 5–15)
BUN: 31 mg/dL — ABNORMAL HIGH (ref 6–20)
CO2: 28 mmol/L (ref 22–32)
Calcium: 8.2 mg/dL — ABNORMAL LOW (ref 8.9–10.3)
Chloride: 104 mmol/L (ref 98–111)
Creatinine, Ser: 1.28 mg/dL — ABNORMAL HIGH (ref 0.61–1.24)
GFR calc Af Amer: >60 mL/min (ref >60)
GFR calc non Af Amer: >60 mL/min (ref >60)
Glucose, Bld: 91 mg/dL (ref 70–99)
Potassium: 3.3 mmol/L — ABNORMAL LOW (ref 3.5–5.1)
Sodium: 140 mmol/L (ref 135–145)

## 2018-10-04 LAB — COOXEMETRY PANEL
Carboxyhemoglobin: 1.3 % (ref 0.5–1.5)
Methemoglobin: 0.6 % (ref 0.0–1.5)
O2 Saturation: 67.2 %
Total hemoglobin: 13.3 g/dL (ref 12.0–16.0)

## 2018-10-04 MED ORDER — POTASSIUM CHLORIDE CRYS ER 20 MEQ PO TBCR
40.0000 meq | EXTENDED_RELEASE_TABLET | Freq: Once | ORAL | Status: AC
  Administered 2018-10-04: 40 meq via ORAL
  Filled 2018-10-04: qty 2

## 2018-10-04 NOTE — Progress Notes (Signed)
PROGRESS NOTE    Corey Palmer  HYQ:[id number] DOB: 1970/05/05 DOA: 10/01/2018 PCP: London Pepper, MD    Brief Narrative:   Corey Palmer is a 48 year-old male with history of chronic systolic CHF/NICM, EF of 95%, ICD, type 2 diabetes mellitus, stage III CKD,  paroxysmal atrial fibrillation on Eliquis, obesity, hypertension, dyslipidemia, anxiety presented to the ER with worsening shortness of breath. Weight found to be 15 pounds above his dry weight. Admitted with acute on chronic systolic CHF.   Assessment & Plan:   Active Problems:   HYPERTRIGLYCERIDEMIA   OBESITY   Essential hypertension, benign   Ischemic cardiomyopathy   Atrial fibrillation (HCC)   Uncontrolled type 2 diabetes mellitus with hyperglycemia (HCC)   GAD (generalized anxiety disorder)   Acute on chronic systolic CHF (congestive heart failure) (HCC)   CHF, acute on chronic (HCC)   Acute on chronic systolic CHF/NICM, EF of 28% Patient presenting to the ED with progressive shortness of breath.  Found to be volume overloaded with 15 pound weight gain from his dry weight. --Cardiology following, appreciate assistance --Net negative 3.8 L past 24 hours, net negative 4.7 L hospitalization --wt 132-->120kg --Continue home Coreg, lisinopril, digoxin; cardiology considering transitioning to [facility] --Continue diuresis with furosemide 40 mg IV twice daily --Creatinine stable, 1.28 this morning --Daily BMP --Strict I's and O's and daily weights  Hypokalemia Potassium down to 3.3, likely from aggressive IV diuresis. --Replete today --Repeat electrolytes in the a.m. to include magnesium  Stage III chronic kidney disease Baseline creatinine around 1.5, stable --Low sliding monitor with diuresis and ACE inhibitor  Type 2 diabetes mellitus Hemoglobin A1c 11.9 on 10/01/2018, poorly controlled. --continue Lantus 50 units and sliding scale insulin, CBGs improving  Paroxysmal atrial fibrillation --Currently in sinus  rhythm  --continue Coreg and Eliquis  Obesity BMI is 33.  Discussed with patient need for aggressive lifestyle changes and weight loss as this complicates all facets of care.  Generalized anxiety/PTSD:  Continue Prozac, Depakote, BuSpar  GERD: Continue PPI  OSA: Continue CPAP nightly   DVT prophylaxis: Eliquis Code Status: Full code Family Communication: None Disposition Plan: Continue inpatient, IV diuresis, likely home pending clinical improvement.   Consultants:   Cardiology  Procedures:   None  Antimicrobials:   None   Subjective: Patient seen and examined at bedside, resting comfortably.  States breathing continues to improve daily, but not back to his normal baseline as of yet.  Continues on IV diuresis.  Better urine output over the past 24 hours.  Continues to be clinically volume overload still.  No other complaints or concerns at this time.  Denies headache, no fever/chills/night sweats, no nausea/vomiting/diarrhea, no chest pain, no palpitations, no weakness, no paresthesias.  No acute events overnight per nursing staff.  Objective: Vitals:   10/04/18 0410 10/04/18 0603 10/04/18 0812 10/04/18 1118  BP: 117/87  125/86 123/83  Pulse: 78  83 86  Resp: 18 (!) 28 16 (!) 22  Temp: 98.2 F (36.8 C)  98.1 F (36.7 C) 98.6 F (37 C)  TempSrc: Oral  Oral Oral  SpO2: 96%  97% 97%  Weight:  120.4 kg    Height:        Intake/Output Summary (Last 24 hours) at 10/04/2018 1121 Last data filed at 10/04/2018 1007 Gross per 24 hour  Intake 1227 ml  Output 4826 ml  Net -3599 ml   Filed Weights   10/02/18 0610 10/03/18 0400 10/04/18 0603  Weight: 121.7 kg 123.5 kg 120.4  kg    Examination:  General exam: Appears calm and comfortable  Respiratory system: Decreased breath sounds bilateral bases with mild crackles, no wheezes, normal respiratory effort, on room air Cardiovascular system: S1 & S2 heard, RRR. No JVD, murmurs, rubs, gallops or clicks.  Trace lower  extremity edema Gastrointestinal system: Abdomen is nondistended, soft and nontender. No organomegaly or masses felt. Normal bowel sounds heard. Central nervous system: Alert and oriented. No focal neurological deficits. Extremities: Symmetric 5 x 5 power. Skin: No rashes, lesions or ulcers Psychiatry: Judgement and insight appear normal. Mood & affect appropriate.     Data Reviewed: I have personally reviewed following labs and imaging studies  CBC: Recent Labs  Lab 10/01/18 1029 10/01/18 1602 10/02/18 0841  WBC 7.6 7.1 7.2  HGB 13.9 14.8 15.3  HCT 43.0 45.2 47.6  MCV 84.8 83.9 85.9  PLT 219 229 277   Basic Metabolic Panel: Recent Labs  Lab 10/01/18 1029 10/01/18 1515 10/02/18 0841 10/03/18 0300 10/04/18 0410  NA 134*  --  134* 135 140  K 4.2  --  4.8 4.2 3.3*  CL 100  --  100 100 104  CO2 25  --  24 25 28   GLUCOSE 261*  --  227* 185* 91  BUN 26*  --  37* 40* 31*  CREATININE 1.47* 1.55* 1.74* 1.77* 1.28*  CALCIUM 8.5*  --  8.6* 8.2* 8.2*  MG  --  1.7 1.9  --   --   PHOS  --  5.0*  --   --   --    GFR: Estimated Creatinine Clearance: 97.7 mL/min (A) (by C-G formula based on SCr of 1.28 mg/dL (H)). Liver Function Tests: No results for input(s): AST, ALT, ALKPHOS, BILITOT, PROT, ALBUMIN in the last 168 hours. Recent Labs  Lab 10/01/18 1233  LIPASE 37   No results for input(s): AMMONIA in the last 168 hours. Coagulation Profile: No results for input(s): INR, PROTIME in the last 168 hours. Cardiac Enzymes: No results for input(s): CKTOTAL, CKMB, CKMBINDEX, TROPONINI in the last 168 hours. BNP (last 3 results) No results for input(s): PROBNP in the last 8760 hours. HbA1C: Recent Labs    10/01/18 1602  HGBA1C 11.9*   CBG: Recent Labs  Lab 10/03/18 0638 10/03/18 1209 10/03/18 1641 10/03/18 2124 10/04/18 0601  GLUCAP 154* 111* 245* 169* 80   Lipid Profile: Recent Labs    10/01/18 1515  CHOL 176  HDL 25*  LDLCALC 126*  TRIG 126  CHOLHDL 7.0    Thyroid Function Tests: Recent Labs    10/01/18 1602  TSH 3.009   Anemia Panel: No results for input(s): VITAMINB12, FOLATE, FERRITIN, TIBC, IRON, RETICCTPCT in the last 72 hours. Sepsis Labs: No results for input(s): PROCALCITON, LATICACIDVEN in the last 168 hours.  Recent Results (from the past 240 hour(s))  SARS CORONAVIRUS 2 (TAT 6-24 HRS) Nasopharyngeal Nasopharyngeal Swab     Status: None   Collection Time: 10/01/18  3:01 PM   Specimen: Nasopharyngeal Swab  Result Value Ref Range Status   SARS Coronavirus 2 NEGATIVE NEGATIVE Final    Comment: (NOTE) SARS-CoV-2 target nucleic acids are NOT DETECTED. The SARS-CoV-2 RNA is generally detectable in upper and lower respiratory specimens during the acute phase of infection. Negative results do not preclude SARS-CoV-2 infection, do not rule out co-infections with other pathogens, and should not be used as the sole basis for treatment or other patient management decisions. Negative results must be combined with clinical observations, patient history,  and epidemiological information. The expected result is Negative. Fact Sheet for Patients: SugarRoll.be Fact Sheet for Healthcare Providers: https://www.woods-mathews.com/ This test is not yet approved or cleared by the [place] FDA and  has been authorized for detection and/or diagnosis of SARS-CoV-2 by FDA under an Emergency Use Authorization (EUA). This EUA will remain  in effect (meaning this test can be used) for the duration of the COVID-19 declaration under Section 56 4(b)(1) of the Act, 21 U.S.C. section 360bbb-3(b)(1), unless the authorization is terminated or revoked sooner. Performed at [facility], [place] [address] [place], [place] [address]          Radiology Studies: [place] Ekg Site Rite  Result Date: 10/02/2018 If [facility] image not attached, placement could not be confirmed due to current cardiac rhythm.        Scheduled Meds: . apixaban  5 mg Oral BID  . aspirin EC  81 mg Oral Daily  . atorvastatin  80 mg Oral QHS  . busPIRone  10 mg Oral TID  . carvedilol  25 mg Oral BID  . digoxin  0.125 mg Oral Daily  . divalproex  500 mg Oral Daily  . fenofibrate  160 mg Oral QHS  . FLUoxetine  80 mg Oral Daily  . furosemide  40 mg Intravenous Q12H  . insulin aspart  0-15 Units Subcutaneous TID WC  . insulin aspart  0-5 Units Subcutaneous QHS  . insulin glargine  50 Units Subcutaneous BH-q7a  . lisinopril  10 mg Oral Daily  . magnesium oxide  400 mg Oral Daily  . potassium chloride  10 mEq Oral Daily  . sodium chloride flush  10-40 mL Intracatheter Q12H  . sodium chloride flush  3 mL Intravenous Once  . sodium chloride flush  3 mL Intravenous Q12H   Continuous Infusions: . sodium chloride       LOS: 2 days    Time spent: 32 minutes spent on chart review, discussion with nursing staff, consultants, updating family and interview/physical exam; more than 50% of that time was spent in counseling and/or coordination of care.     J [place] DO [facility] Pager [phone]  If 7PM-7AM, please contact night-coverage [email or web address] Password [email or web address] 10/04/2018, 11:21 AM

## 2018-10-04 NOTE — Progress Notes (Signed)
Progress Note  Patient Name: Corey Palmer Date of Encounter: 10/04/2018  Primary Cardiologist:   No primary care provider on file.   Subjective   Denies pain.  "Feels much better."   Inpatient Medications    Scheduled Meds: . apixaban  5 mg Oral BID  . aspirin EC  81 mg Oral Daily  . atorvastatin  80 mg Oral QHS  . busPIRone  10 mg Oral TID  . carvedilol  25 mg Oral BID  . digoxin  0.125 mg Oral Daily  . divalproex  500 mg Oral Daily  . fenofibrate  160 mg Oral QHS  . FLUoxetine  80 mg Oral Daily  . furosemide  40 mg Intravenous Q12H  . insulin aspart  0-15 Units Subcutaneous TID WC  . insulin aspart  0-5 Units Subcutaneous QHS  . insulin glargine  50 Units Subcutaneous BH-q7a  . lisinopril  10 mg Oral Daily  . magnesium oxide  400 mg Oral Daily  . potassium chloride  10 mEq Oral Daily  . sodium chloride flush  10-40 mL Intracatheter Q12H  . sodium chloride flush  3 mL Intravenous Once  . sodium chloride flush  3 mL Intravenous Q12H   Continuous Infusions: . sodium chloride     PRN Meds: sodium chloride, acetaminophen, diphenhydrAMINE-zinc acetate, hydrOXYzine, nitroGLYCERIN, sodium chloride flush, sodium chloride flush   Vital Signs    Vitals:   10/03/18 2338 10/04/18 0410 10/04/18 0603 10/04/18 0812  BP: (!) 115/91 117/87  125/86  Pulse: 80 78  83  Resp: 12 18 (!) 28 16  Temp: 98.3 F (36.8 C) 98.2 F (36.8 C)  98.1 F (36.7 C)  TempSrc: Axillary Oral  Oral  SpO2: 96% 96%  97%  Weight:   120.4 kg   Height:        Intake/Output Summary (Last 24 hours) at 10/04/2018 1047 Last data filed at 10/04/2018 1007 Gross per 24 hour  Intake 1227 ml  Output 4826 ml  Net -3599 ml   Filed Weights   10/02/18 0610 10/03/18 0400 10/04/18 0603  Weight: 121.7 kg 123.5 kg 120.4 kg    Telemetry    NSR with PVCs - Personally Reviewed  ECG    NA - Personally Reviewed  Physical Exam   GEN: No  acute distress.   Neck:   Positive JVD to jaw Cardiac: RRR, no  murmurs, rubs, or gallops.  Respiratory: Clear   to auscultation bilaterally. GI: Soft, nontender, non-distended, normal bowel sounds  MS:  Moderate leg edema; No deformity. Neuro:   Nonfocal  Psych: Oriented and appropriate   Labs    Chemistry Recent Labs  Lab 10/02/18 0841 10/03/18 0300 10/04/18 0410  NA 134* 135 140  K 4.8 4.2 3.3*  CL 100 100 104  CO2 24 25 28   GLUCOSE 227* 185* 91  BUN 37* 40* 31*  CREATININE 1.74* 1.77* 1.28*  CALCIUM 8.6* 8.2* 8.2*  GFRNONAA 45* 44* >60  GFRAA 52* 51* >60  ANIONGAP 10 10 8      Hematology Recent Labs  Lab 10/01/18 1029 10/01/18 1602 10/02/18 0841  WBC 7.6 7.1 7.2  RBC 5.07 5.39 5.54  HGB 13.9 14.8 15.3  HCT 43.0 45.2 47.6  MCV 84.8 83.9 85.9  MCH 27.4 27.5 27.6  MCHC 32.3 32.7 32.1  RDW 14.2 14.5 14.5  PLT 219 229 219    Cardiac EnzymesNo results for input(s): TROPONINI in the last 168 hours. No results for input(s): TROPIPOC in the last  168 hours.   BNP Recent Labs  Lab 10/01/18 1253  BNP 659.0*     DDimer No results for input(s): DDIMER in the last 168 hours.   Radiology    [place] Ekg Site Rite  Result Date: 10/02/2018 If [facility] image not attached, placement could not be confirmed due to current cardiac rhythm.   Cardiac Studies   NA:  Echo pending.   Patient Profile     48 y.o. male with with a hx of NICMP (LHC in 2002 with 40% LAD stenosis), with LVEF 10-15% back in 2008, later with improvement to 25-30%, most recent outside echocardiogram from 09/01/2018 with LVEF 10-15%. He also has h/o ICD placement in 2683 (complicated by RA perforation and tamponade), generator change while in prison, poorly controlled DM II with peripheral neuropathy, obesity,  PAF on Eliquis, DM, HTN, bipolar disorder, GERD, OSA not on CPAP (currently doesn't own a CPAP), CKD III, who is being seen today for the evaluation of CHF at the request of Dr Broadus John. Corey Palmer was seen by Dr Aundra Dubin in 2013, he was lost for follow up (he was  incarcerated for some time) until 09/2017 when he saw Dr Lovena Le.   Assessment & Plan    ACUTE ON CHRONIC SYSTOLIC HF:   Intake and output with net negative 3.8 liters yesterday with net negative since admit 5.4.  Co Ox 67.2.  CVP looks like it is around 11.  Creat has improved.   Potassium was supplemented.  Continue current IV diuresis.  He still looks like he has a lot of volume.   No indication for advanced therapies at this time although ultimately he should be followed by the advanced HF team.   I would suggest change to [facility] tomorrow if creat has continued to improve. Marland Kitchen      HTN: This is being managed in the context of treating his CHF  ATRIAL FIB:   NSR .  No change in therapy.   CKD IV:  Creat is improved..    For questions or updates, please contact [place] Please consult [email or web address] for contact info under Cardiology/STEMI.   Signed, Minus Breeding, MD  10/04/2018, 10:47 AM

## 2018-10-04 NOTE — Progress Notes (Signed)
Patient states he will place himself on CPAP when he is ready and he will call if any further assistance needed.

## 2018-10-04 NOTE — Progress Notes (Signed)
Patient states that he can handle his own CPAP no assistance is needed.

## 2018-10-05 DIAGNOSIS — I255 Ischemic cardiomyopathy: Secondary | ICD-10-CM

## 2018-10-05 LAB — BASIC METABOLIC PANEL
Anion gap: 9 (ref 5–15)
BUN: 27 mg/dL — ABNORMAL HIGH (ref 6–20)
CO2: 28 mmol/L (ref 22–32)
Calcium: 8.3 mg/dL — ABNORMAL LOW (ref 8.9–10.3)
Chloride: 100 mmol/L (ref 98–111)
Creatinine, Ser: 1.18 mg/dL (ref 0.61–1.24)
GFR calc Af Amer: >60 mL/min (ref >60)
GFR calc non Af Amer: >60 mL/min (ref >60)
Glucose, Bld: 148 mg/dL — ABNORMAL HIGH (ref 70–99)
Potassium: 4 mmol/L (ref 3.5–5.1)
Sodium: 137 mmol/L (ref 135–145)

## 2018-10-05 LAB — GLUCOSE, CAPILLARY
Glucose-Capillary: 179 mg/dL — ABNORMAL HIGH (ref 70–99)
Glucose-Capillary: 203 mg/dL — ABNORMAL HIGH (ref 70–99)
Glucose-Capillary: 391 mg/dL — ABNORMAL HIGH (ref 70–99)
Glucose-Capillary: 173 mg/dL — ABNORMAL HIGH (ref 70–99)

## 2018-10-05 LAB — MAGNESIUM: Magnesium: 1.8 mg/dL (ref 1.7–2.4)

## 2018-10-05 LAB — COOXEMETRY PANEL
Carboxyhemoglobin: 1.5 % (ref 0.5–1.5)
Methemoglobin: 0.9 % (ref 0.0–1.5)
O2 Saturation: 53.7 %
Total hemoglobin: 14.5 g/dL (ref 12.0–16.0)

## 2018-10-05 MED ORDER — LOSARTAN POTASSIUM 50 MG PO TABS
50.0000 mg | ORAL_TABLET | Freq: Every day | ORAL | Status: DC
  Administered 2018-10-06: 09:00:00 50 mg via ORAL
  Filled 2018-10-05: qty 1

## 2018-10-05 NOTE — Progress Notes (Signed)
PROGRESS NOTE    Corey Palmer  HQP:[id number] DOB: [birth year] DOA: 10/01/2018 PCP: London Pepper, MD    Brief Narrative:   Corey Palmer is a 48 year-old male with history of chronic systolic CHF/NICM, EF of 59%, ICD, type 2 diabetes mellitus, stage III CKD,  paroxysmal atrial fibrillation on Eliquis, obesity, hypertension, dyslipidemia, anxiety presented to the ER with worsening shortness of breath. Weight found to be 15 pounds above his dry weight. Admitted with acute on chronic systolic CHF.   Assessment & Plan:   Active Problems:   HYPERTRIGLYCERIDEMIA   OBESITY   Essential hypertension, benign   Ischemic cardiomyopathy   Atrial fibrillation (HCC)   Uncontrolled type 2 diabetes mellitus with hyperglycemia (HCC)   GAD (generalized anxiety disorder)   Acute on chronic systolic CHF (congestive heart failure) (HCC)   CHF, acute on chronic (HCC)   Acute on chronic systolic CHF/NICM, EF of 93% Patient presenting to the ED with progressive shortness of breath.  Found to be volume overloaded with 15 pound weight gain from his dry weight. --Cardiology following, appreciate assistance --Net negative 3.9 L past 24 hours, net negative 8.5 L hospitalization --wt 132-->120-->kg --Continue home Coreg, digoxin --Cardiology changing lisinopril to losartan today in anticipation of eventual change to Entresto --Continue diuresis with furosemide 40 mg IV twice daily --Creatinine stable, 1.18 this morning --Daily BMP --Strict I's and O's and daily weights  Hypokalemia Potassium down to 4.0 this am. --Repeat electrolytes in the a.m. to include magnesium  Stage III chronic kidney disease Baseline creatinine around 1.5, stable --continue to closely monitor with diuresis and ACE inhibitor  Type 2 diabetes mellitus Hemoglobin A1c 11.9 on 10/01/2018, poorly controlled. --continue Lantus 50 units and sliding scale insulin, CBGs improving  Paroxysmal atrial fibrillation --Currently in  sinus rhythm  --continue Coreg and Eliquis  Obesity BMI is 33.  Discussed with patient need for aggressive lifestyle changes and weight loss as this complicates all facets of care.  Generalized anxiety/PTSD:  Continue Prozac, Depakote, BuSpar  GERD: Continue PPI  OSA: Continue CPAP nightly   DVT prophylaxis: Eliquis Code Status: Full code Family Communication: None Disposition Plan: Continue inpatient, IV diuresis, likely home pending clinical improvement.   Consultants:   Cardiology  Procedures:   None  Antimicrobials:   None   Subjective: Patient seen and examined at bedside, resting comfortably.  States breathing continues to improve daily, but not back to his normal baseline as of yet.  Continues on IV diuresis with good urine output.  .  Continues to be clinically volume overload still.  No other complaints or concerns at this time.  Denies headache, no fever/chills/night sweats, no nausea/vomiting/diarrhea, no chest pain, no palpitations, no weakness, no paresthesias.  No acute events overnight per nursing staff.  Objective: Vitals:   10/04/18 2343 10/05/18 0437 10/05/18 0450 10/05/18 0834  BP: 117/85 100/75  112/83  Pulse: 81 82  76  Resp: 14 20  20   Temp: 98.3 F (36.8 C) 98.1 F (36.7 C)  98.5 F (36.9 C)  TempSrc: Oral Oral  Oral  SpO2: 96% 96%  98%  Weight:   119 kg   Height:        Intake/Output Summary (Last 24 hours) at 10/05/2018 1155 Last data filed at 10/05/2018 0829 Gross per 24 hour  Intake 920 ml  Output 4775 ml  Net -3855 ml   Filed Weights   10/03/18 0400 10/04/18 0603 10/05/18 0450  Weight: 123.5 kg 120.4 kg 119 kg  Examination:  General exam: Appears calm and comfortable  Respiratory system: Decreased breath sounds bilateral bases with mild crackles, no wheezes, normal respiratory effort, on room air Cardiovascular system: S1 & S2 heard, RRR. No JVD, murmurs, rubs, gallops or clicks.  Trace lower extremity edema  Gastrointestinal system: Abdomen is nondistended, soft and nontender. No organomegaly or masses felt. Normal bowel sounds heard. Central nervous system: Alert and oriented. No focal neurological deficits. Extremities: Symmetric 5 x 5 power. Skin: No rashes, lesions or ulcers Psychiatry: Judgement and insight appear normal. Mood & affect appropriate.     Data Reviewed: I have personally reviewed following labs and imaging studies  CBC: Recent Labs  Lab 10/01/18 1029 10/01/18 1602 10/02/18 0841  WBC 7.6 7.1 7.2  HGB 13.9 14.8 15.3  HCT 43.0 45.2 47.6  MCV 84.8 83.9 85.9  PLT 219 229 161   Basic Metabolic Panel: Recent Labs  Lab 10/01/18 1029 10/01/18 1515 10/02/18 0841 10/03/18 0300 10/04/18 0410 10/05/18 0455  NA 134*  --  134* 135 140 137  K 4.2  --  4.8 4.2 3.3* 4.0  CL 100  --  100 100 104 100  CO2 25  --  24 25 28 28   GLUCOSE 261*  --  227* 185* 91 148*  BUN 26*  --  37* 40* 31* 27*  CREATININE 1.47* 1.55* 1.74* 1.77* 1.28* 1.18  CALCIUM 8.5*  --  8.6* 8.2* 8.2* 8.3*  MG  --  1.7 1.9  --   --  1.8  PHOS  --  5.0*  --   --   --   --    GFR: Estimated Creatinine Clearance: 105.3 mL/min (by C-G formula based on SCr of 1.18 mg/dL). Liver Function Tests: No results for input(s): AST, ALT, ALKPHOS, BILITOT, PROT, ALBUMIN in the last 168 hours. Recent Labs  Lab 10/01/18 1233  LIPASE 37   No results for input(s): AMMONIA in the last 168 hours. Coagulation Profile: No results for input(s): INR, PROTIME in the last 168 hours. Cardiac Enzymes: No results for input(s): CKTOTAL, CKMB, CKMBINDEX, TROPONINI in the last 168 hours. BNP (last 3 results) No results for input(s): PROBNP in the last 8760 hours. HbA1C: No results for input(s): HGBA1C in the last 72 hours. CBG: Recent Labs  Lab 10/04/18 0601 10/04/18 1121 10/04/18 1641 10/04/18 2110 10/05/18 0627  GLUCAP 80 318* 150* 299* 179*   Lipid Profile: No results for input(s): CHOL, HDL, LDLCALC, TRIG,  CHOLHDL, LDLDIRECT in the last 72 hours. Thyroid Function Tests: No results for input(s): TSH, T4TOTAL, FREET4, T3FREE, THYROIDAB in the last 72 hours. Anemia Panel: No results for input(s): VITAMINB12, FOLATE, FERRITIN, TIBC, IRON, RETICCTPCT in the last 72 hours. Sepsis Labs: No results for input(s): PROCALCITON, LATICACIDVEN in the last 168 hours.  Recent Results (from the past 240 hour(s))  SARS CORONAVIRUS 2 (TAT 6-24 HRS) Nasopharyngeal Nasopharyngeal Swab     Status: None   Collection Time: 10/01/18  3:01 PM   Specimen: Nasopharyngeal Swab  Result Value Ref Range Status   SARS Coronavirus 2 NEGATIVE NEGATIVE Final    Comment: (NOTE) SARS-CoV-2 target nucleic acids are NOT DETECTED. The SARS-CoV-2 RNA is generally detectable in upper and lower respiratory specimens during the acute phase of infection. Negative results do not preclude SARS-CoV-2 infection, do not rule out co-infections with other pathogens, and should not be used as the sole basis for treatment or other patient management decisions. Negative results must be combined with clinical observations, patient history, and  epidemiological information. The expected result is Negative. Fact Sheet for Patients: SugarRoll.be Fact Sheet for Healthcare Providers: https://www.woods-mathews.com/ This test is not yet approved or cleared by the [place] FDA and  has been authorized for detection and/or diagnosis of SARS-CoV-2 by FDA under an Emergency Use Authorization (EUA). This EUA will remain  in effect (meaning this test can be used) for the duration of the COVID-19 declaration under Section 56 4(b)(1) of the Act, 21 U.S.C. section 360bbb-3(b)(1), unless the authorization is terminated or revoked sooner. Performed at [facility], [place] [address] [place], [place] [address]          Radiology Studies: No results found.      Scheduled Meds: . apixaban  5 mg  Oral BID  . aspirin EC  81 mg Oral Daily  . atorvastatin  80 mg Oral QHS  . busPIRone  10 mg Oral TID  . carvedilol  25 mg Oral BID  . digoxin  0.125 mg Oral Daily  . divalproex  500 mg Oral Daily  . fenofibrate  160 mg Oral QHS  . FLUoxetine  80 mg Oral Daily  . furosemide  40 mg Intravenous Q12H  . insulin aspart  0-15 Units Subcutaneous TID WC  . insulin aspart  0-5 Units Subcutaneous QHS  . insulin glargine  50 Units Subcutaneous BH-q7a  . losartan  50 mg Oral Daily  . magnesium oxide  400 mg Oral Daily  . potassium chloride  10 mEq Oral Daily  . sodium chloride flush  10-40 mL Intracatheter Q12H  . sodium chloride flush  3 mL Intravenous Once  . sodium chloride flush  3 mL Intravenous Q12H   Continuous Infusions: . sodium chloride       LOS: 3 days    Time spent: 32 minutes spent on chart review, discussion with nursing staff, consultants, updating family and interview/physical exam; more than 50% of that time was spent in counseling and/or coordination of care.    Sable Knoles J [place] DO [facility] Pager [phone]  If 7PM-7AM, please contact night-coverage [email or web address] Password [email or web address] 10/05/2018, 11:55 AM

## 2018-10-05 NOTE — Progress Notes (Addendum)
Progress Note  Patient Name: Corey Palmer Date of Encounter: 10/05/2018  Primary Cardiologist:  EP:  Lovena Le  Subjective   Pt breathing is OK   NO CP    Inpatient Medications    Scheduled Meds: . apixaban  5 mg Oral BID  . aspirin EC  81 mg Oral Daily  . atorvastatin  80 mg Oral QHS  . busPIRone  10 mg Oral TID  . carvedilol  25 mg Oral BID  . digoxin  0.125 mg Oral Daily  . divalproex  500 mg Oral Daily  . fenofibrate  160 mg Oral QHS  . FLUoxetine  80 mg Oral Daily  . furosemide  40 mg Intravenous Q12H  . insulin aspart  0-15 Units Subcutaneous TID WC  . insulin aspart  0-5 Units Subcutaneous QHS  . insulin glargine  50 Units Subcutaneous BH-q7a  . lisinopril  10 mg Oral Daily  . magnesium oxide  400 mg Oral Daily  . potassium chloride  10 mEq Oral Daily  . sodium chloride flush  10-40 mL Intracatheter Q12H  . sodium chloride flush  3 mL Intravenous Once  . sodium chloride flush  3 mL Intravenous Q12H   Continuous Infusions: . sodium chloride     PRN Meds: sodium chloride, acetaminophen, diphenhydrAMINE-zinc acetate, hydrOXYzine, nitroGLYCERIN, sodium chloride flush, sodium chloride flush   Vital Signs    Vitals:   10/04/18 2343 10/05/18 0437 10/05/18 0450 10/05/18 0834  BP: 117/85 100/75  112/83  Pulse: 81 82  76  Resp: 14 20  20   Temp: 98.3 F (36.8 C) 98.1 F (36.7 C)  98.5 F (36.9 C)  TempSrc: Oral Oral  Oral  SpO2: 96% 96%  98%  Weight:   119 kg   Height:        Intake/Output Summary (Last 24 hours) at 10/05/2018 0846 Last data filed at 10/05/2018 0829 Gross per 24 hour  Intake 920 ml  Output 5200 ml  Net -4280 ml    Net neg 9.22 L   Last 3 Weights 10/05/2018 10/04/2018 10/03/2018  Weight (lbs) 262 lb 4.8 oz 265 lb 6.9 oz 272 lb 4.3 oz  Weight (kg) 118.978 kg 120.4 kg 123.5 kg  Some encounter information is confidential and restricted. Go to Review Flowsheets activity to see all data.      Telemetry    SR with PVC   - Personally Reviewed   ECG     Physical Exam   GEN: OBese 48 yo in NAD    Neck: Neck is fulll but JVP is still increased   Cardiac: RRR, no murmurs, rubs, or gallops.  Respiratory: Clear to auscultation bilaterally. GI: Soft, nontender, non-distended  MS: No edema; No deformity. Neuro:  Nonfocal  Psych: Normal affect   Labs    High Sensitivity Troponin:   Recent Labs  Lab 10/01/18 1233 10/01/18 1515 10/01/18 1804  TROPONINIHS 21* 21* 22*      Chemistry Recent Labs  Lab 10/03/18 0300 10/04/18 0410 10/05/18 0455  NA 135 140 137  K 4.2 3.3* 4.0  CL 100 104 100  CO2 25 28 28   GLUCOSE 185* 91 148*  BUN 40* 31* 27*  CREATININE 1.77* 1.28* 1.18  CALCIUM 8.2* 8.2* 8.3*  GFRNONAA 44* >60 >60  GFRAA 51* >60 >60  ANIONGAP 10 8 9      Hematology Recent Labs  Lab 10/01/18 1029 10/01/18 1602 10/02/18 0841  WBC 7.6 7.1 7.2  RBC 5.07 5.39 5.54  HGB 13.9  14.8 15.3  HCT 43.0 45.2 47.6  MCV 84.8 83.9 85.9  MCH 27.4 27.5 27.6  MCHC 32.3 32.7 32.1  RDW 14.2 14.5 14.5  PLT 219 229 219    BNP Recent Labs  Lab 10/01/18 1253  BNP 659.0*     DDimer No results for input(s): DDIMER in the last 168 hours.   Radiology    No results found.  Cardiac Studies     Patient Profile     48 y.o. male hx of NICMP (LHC in 2002 with 40% LAD stenosis), with LVEF 10-15% back in 2008, later with improvement to 25-30%, most recent outside echocardiogram from 09/01/2018 with LVEF 10-15%. He also has h/o ICD placement in 2008(complicated by RA perforation and tamponade), generator change while in prison, poorly controlled DM II with peripheral neuropathy, obesity,PAF on Eliquis, DM, HTN, bipolar disorder, GERD, OSA not on CPAP(currently doesn't own a CPAP), CKD III,who is being seen today for the evaluation ofCHFat the request of Dr Broadus John. Mr Zhong was seen by Dr Aundra Dubin in 2013, he was lost for follow up (he was incarcerated for some time) until 09/2017 when he saw Dr Lovena Le.    Assessment & Plan     1  Acute on chronic systolic CHF;   Pt  Has been diuresing.   Volume is improved though I  think it is still up PT 9.2 L negative     Continue diuresis with IV lasix today   J Hochrein recomm starting entresto  Unfortunately pt hs been getting lisinopril WIll switch to losartan 50    At some pt will switch to entresto, unfort not now. WIll have dietary see pt re low Na   2  HTN  BP is OK  SHould tolerate switch to losartan    3  Atrial fibrillation  ON Eliquis    4  CKD  Stage 4   Follow renal function    For questions or updates, please contact [facility] Please consult [email or web address] for contact info under        Signed, Dorris Carnes, MD  10/05/2018, 8:46 AM

## 2018-10-05 NOTE — Progress Notes (Signed)
Patient states that they can place self on CPAP. RT will monitor as needed.

## 2018-10-06 LAB — BASIC METABOLIC PANEL
Anion gap: 5 (ref 5–15)
BUN: 20 mg/dL (ref 6–20)
CO2: 27 mmol/L (ref 22–32)
Calcium: 8 mg/dL — ABNORMAL LOW (ref 8.9–10.3)
Chloride: 107 mmol/L (ref 98–111)
Creatinine, Ser: 1.06 mg/dL (ref 0.61–1.24)
GFR calc Af Amer: >60 mL/min (ref >60)
GFR calc non Af Amer: >60 mL/min (ref >60)
Glucose, Bld: 92 mg/dL (ref 70–99)
Potassium: 3.8 mmol/L (ref 3.5–5.1)
Sodium: 139 mmol/L (ref 135–145)

## 2018-10-06 LAB — GLUCOSE, CAPILLARY
Glucose-Capillary: 111 mg/dL — ABNORMAL HIGH (ref 70–99)
Glucose-Capillary: 230 mg/dL — ABNORMAL HIGH (ref 70–99)

## 2018-10-06 LAB — COOXEMETRY PANEL
Carboxyhemoglobin: 1.2 % (ref 0.5–1.5)
Methemoglobin: 0.7 % (ref 0.0–1.5)
O2 Saturation: 54.2 %
Total hemoglobin: 14.4 g/dL (ref 12.0–16.0)

## 2018-10-06 LAB — MAGNESIUM: Magnesium: 1.6 mg/dL — ABNORMAL LOW (ref 1.7–2.4)

## 2018-10-06 MED ORDER — MAGNESIUM SULFATE 2 GM/50ML IV SOLN
2.0000 g | Freq: Once | INTRAVENOUS | Status: AC
  Administered 2018-10-06: 2 g via INTRAVENOUS
  Filled 2018-10-06: qty 50

## 2018-10-06 MED ORDER — LOSARTAN POTASSIUM 50 MG PO TABS: 50.0000 mg | ORAL_TABLET | Freq: Every day | ORAL | 0 refills | Status: DC

## 2018-10-06 MED ORDER — FENOFIBRATE 160 MG PO TABS: 160.0000 mg | ORAL_TABLET | Freq: Every day | ORAL | 0 refills | Status: DC

## 2018-10-06 MED ORDER — SPIRONOLACTONE 25 MG PO TABS: 12.5000 mg | ORAL_TABLET | Freq: Every day | ORAL | 0 refills | Status: DC

## 2018-10-06 MED ORDER — ASPIRIN 81 MG PO TBEC: 81.0000 mg | DELAYED_RELEASE_TABLET | Freq: Every day | ORAL | 0 refills | Status: DC

## 2018-10-06 NOTE — Progress Notes (Signed)
Physical Therapy Treatment Patient Details Name: Corey Palmer MRN: [id number] DOB: 11-11-1970 Today's Date: 10/06/2018    History of Present Illness 48 y.o. male with admitted with SOB and CHF exacerbation. PMhx: CHF with EF 15%, pacemaker since he was 48 year old, HTN, HLD, diabetes, obesity, anxiety, PTSD, CAD, bipolar, gout, A. fib    PT Comments    Patient able to demonstrate safe mobility without fall risk as per DGI scoring 24/24.  Also without chest pain today and VSS throughout.  Did discuss slow return to activity and need for monitoring responses to activity.  No further skilled PT needs.  PT signing off.    Follow Up Recommendations  No PT follow up     Equipment Recommendations  None recommended by PT    Recommendations for Other Services       Precautions / Restrictions Precautions Precautions: None    Mobility  Bed Mobility Overal bed mobility: Modified Independent                Transfers Overall transfer level: Independent                  Ambulation/Gait Ambulation/Gait assistance: Independent Gait Distance (Feet): 300 Feet Assistive device: None Gait Pattern/deviations: WFL(Within Functional Limits)     General Gait Details: completed DGI without deficits   Stairs Stairs: Yes Stairs assistance: Independent Stair Management: No rails;Alternating pattern Number of Stairs: 3 General stair comments: able to negotiate steps no rail and turned around on 3rd step to come back down without difficulty   Wheelchair Mobility    Modified Rankin (Stroke Patients Only)       Balance Overall balance assessment: Independent                               Standardized Balance Assessment Standardized Balance Assessment : Dynamic Gait Index   Dynamic Gait Index Level Surface: Normal Change in Gait Speed: Normal Gait with Horizontal Head Turns: Normal Gait with Vertical Head Turns: Normal Gait and Pivot Turn: Normal Step  Over Obstacle: Normal Step Around Obstacles: Normal Steps: Normal Total Score: 24      Cognition Arousal/Alertness: Awake/alert Behavior During Therapy: WFL for tasks assessed/performed Overall Cognitive Status: Within Functional Limits for tasks assessed                                        Exercises      General Comments        Pertinent Vitals/Pain Pain Assessment: No/denies pain    Home Living                      Prior Function            PT Goals (current goals can now be found in the care plan section) Progress towards PT goals: Goals met/education completed, patient discharged from PT    Frequency    Min 3X/week      PT Plan Current plan remains appropriate    Co-evaluation              AM-PAC PT "6 Clicks" Mobility   Outcome Measure  Help needed turning from your back to your side while in a flat bed without using bedrails?: None Help needed moving from lying on your back to sitting on the side of a flat  bed without using bedrails?: None Help needed moving to and from a bed to a chair (including a wheelchair)?: None Help needed standing up from a chair using your arms (e.g., wheelchair or bedside chair)?: None Help needed to walk in hospital room?: None Help needed climbing 3-5 steps with a railing? : None 6 Click Score: 24    End of Session   Activity Tolerance: Patient tolerated treatment well Patient left: in bed;Other (comment);with call bell/phone within reach   PT Visit Diagnosis: Other abnormalities of gait and mobility (R26.89)     Time: 1030-1314 PT Time Calculation (min) (ACUTE ONLY): 15 min  Charges:  $Gait Training: 8-22 mins                     Magda Kiel, [place] [phone] 10/06/2018    Reginia Naas 10/06/2018, 4:03 PM

## 2018-10-06 NOTE — Progress Notes (Signed)
CARDIAC REHAB PHASE I   Pt seen ambulating independently in hallway with PT. Pt denies CP or SOB. Pt educated on importance of daily weights and low sodium diets. Pt denies questions or concerns. For d/c today.  [id number] Rufina Falco, RN BSN 10/06/2018 1:22 PM

## 2018-10-06 NOTE — Progress Notes (Signed)
   Nutrition Education Note RD working remotely.  RD consulted for nutrition education regarding low sodium heart healthy diet. RD unable to reach patient via phone after several attempts; received busy signal on two occasions.   RD will provide "Low Sodium Nutrition Therapy" handout from the Academy of [employer] via mail. Handout provides examples on ways to decrease sodium intake in diet. Discourages intake of processed foods and use of salt shaker. Encourages fresh fruits and vegetables as well as whole grain sources of carbohydrates to maximize fiber intake.   Handout educates on why it is important for patient to adhere to diet recommendations, and emphasizes the role of fluids, foods to avoid, and importance of weighing self daily.   Body mass index is 32.25 kg/m. Pt meets criteria for obese based on current BMI.  Current diet order is [place] patient is consuming approximately 100% of meals at this time. Labs and medications reviewed. No further nutrition interventions warranted at this time. RD contact information provided. If additional nutrition issues arise, please re-consult RD.   Lajuan Lines, [place], [place] [phone] After Hours/Weekend Pager: [phone]

## 2018-10-06 NOTE — Discharge Instructions (Signed)
Preventing Heart Failure Heart failure is a condition in which the heart has trouble pumping blood. This may mean that the heart cannot pump enough blood out to the body, or that the heart does not fill up with enough blood. Either of those problems can lead to symptoms such as fatigue, trouble breathing, and swelling throughout the body. This is a common medical condition that affects not only the heart, but the entire body. Making certain nutrition and lifestyle changes can help you prevent heart failure and avoid serious health problems. What nutrition changes can be made?   If you are overweight or obese, reduce how many calories you eat each day so that you lose weight. Work with your health care provider or a diet and nutrition specialist (dietitian) to determine how many calories you need each day.  Eat foods that are low in salt (sodium). Avoid adding extra salt to foods.  Eat a well-balanced diet that includes a lot of: ? Fresh fruits and vegetables. ? Whole grains. ? Lean meats. ? Beans. ? Fat-free or low-fat dairy products.  Avoid foods that contain a lot of: ? Trans fats. ? Saturated fats. ? Sugar. ? Cholesterol. What lifestyle changes can be made?   Do not use any products that contain nicotine or tobacco, such as cigarettes and e-cigarettes. If you need help quitting or reducing how much you smoke, ask your health care provider.  Stop using alcohol, or limit alcohol intake to no more than 1 drink a day for nonpregnant women and 2 drinks a day for men. One drink equals 12 oz of beer, 5 oz of wine, or 1 oz of hard liquor.  Exercise for at least 150 minutes each week, or as much as told by your health care provider. ? Do moderate-intensity exercise, such as brisk walking, bicycling, or water aerobics. ? Ask your health care provider which activities are safe for you.  See a health care provider regularly for screening and wellness checks. Know your heart health  indicators, such as: ? Blood pressure. ? Cholesterol levels. ? Blood sugar (glucose) levels. ? Weight and BMI.  If you have diabetes, manage your condition and follow your treatment plan as instructed.  Try to get 7-9 hours of sleep each night. To help with sleep: ? Keep your bedroom cool and dark. ? Do not eat a heavy meal during the hour before you go to bed. ? Do not drink alcohol or caffeinated drinks before bed. ? Avoid screen time before bedtime. This means avoiding television, computers, tablets, and cell phones.  Find ways to relax and manage stress. These may include: ? Breathing exercises. ? Meditation. ? Yoga. ? Listening to music. Why are these changes important?  A well-balanced diet with the appropriate amount of calories can keep your body weight at a healthy level, which reduces strain on your heart.  A low-sodium diet can help keep your blood pressure in a normal range and keep your blood vessels working properly.  Quitting smoking and limiting alcohol intake can reduce harmful effects that these substances have on your heart and blood vessels.  Regular exercise can keep your heart strong so it can pump blood normally.  Managing diabetes helps your blood circulate and can help you maintain a healthy weight.  Managing stress helps to reduce the risk of high blood pressure and heart problems. What can happen if changes are not made? Heart failure can cause very serious problems that may get worse over time, such as:  Extreme fatigue during normal physical activities.  Shortness of breath or trouble breathing.  Swelling in your abdomen, legs, ankles, feet, or neck.  Fluid buildup throughout the body.  Weight gain.  Cough.  Frequent urination. What can I do to lower my risk? You may be able to lower your risk of heart failure by:  Losing weight or keeping your weight under control.  Working with your health care provider to manage  your: ? Cholesterol. ? Blood pressure. ? Diabetes, if this applies.  Eating a healthy diet.  Exercising regularly.  Avoiding unhealthy habits, such as smoking, drinking, or using drugs.  Getting plenty of sleep.  Managing your stress. How is this treated? Heart failure cannot be cured except by heart transplant, but treatment can help to improve your quality of life. Treatment may include:  Medicines to help: ? Lower blood pressure. ? Remove excess sodium from your body. ? Relax blood vessels. ? Improve heart function. ? Control other symptoms of heart failure.  Surgery to open blocked coronary arteries or repair damaged heart valves.  Implantation of a biventricular pacemaker to improve heart muscle function (cardiac resynchronization therapy). This device paces both the right ventricle and left ventricle.  Implantation of a device to treat serious abnormal heart rhythms (implantable cardioverter defibrillator, ICD).  Implantation of a mechanical heart pump to improve the pumping ability of your heart (left ventricular assist device, LVAD).  Heart transplant. This treatment is considered for certain people who do not improve with other treatments. Where to find more information  National Heart, Lung, and Blood Institute: ClickDebate.gl  Centers for Disease Control and Prevention: LawyerNetworking.com.cy  [facility]: https://www.[place]-brown.info/  American Heart Association: ReligiousCamps.at.jsp Contact a health care provider if:  You have rapid weight gain.  You have increasing shortness of breath that is unusual for you.  You tire easily, or you are unable to participate in your usual activities.  You cough more than normal, especially with physical activity.  You have any swelling or  more swelling in areas such as your hands, feet, ankles, or abdomen. Summary  Heart failure can be prevented by making changes to your diet and your lifestyle.  It is important to eat a healthy diet, manage your weight, exercise regularly, manage stress, avoid drugs and alcohol, and keep your cholesterol and blood pressure under control.  Heart failure can cause very serious problems over time. This information is not intended to replace advice given to you by your health care provider. Make sure you discuss any questions you have with your health care provider. Document Released: 09/05/2015 Document Revised: 05/08/2018 Document Reviewed: 09/05/2015 Elsevier Patient Education  [place]. Heart Failure, Diagnosis  Heart failure means that your heart is not able to pump blood in the right way. This makes it hard for your body to work well. Heart failure is usually a long-term (chronic) condition. You must take good care of yourself and follow your treatment plan from your doctor. What are the causes? This condition may be caused by:  High blood pressure.  Build up of cholesterol and fat in the arteries.  Heart attack. This injures the heart muscle.  Heart valves that do not open and close properly.  Damage of the heart muscle. This is also called cardiomyopathy.  Lung disease.  Abnormal heart rhythms. What increases the risk? The risk of heart failure goes up as a person ages. This condition is also more likely to develop in people who:  Are overweight.  Are male.  Smoke or chew tobacco.  Abuse alcohol or illegal drugs.  Have taken medicines that can damage the heart.  Have diabetes.  Have abnormal heart rhythms.  Have thyroid problems.  Have low blood counts (anemia). What are the signs or symptoms? Symptoms of this condition include:  Shortness of breath.  Coughing.  Swelling of the feet, ankles, legs, or belly.  Losing weight for no  reason.  Trouble breathing.  Waking from sleep because of the need to sit up and get more air.  Rapid heartbeat.  Being very tired.  Feeling dizzy, or feeling like you may pass out (faint).  Having no desire to eat.  Feeling like you may vomit (nauseous).  Peeing (urinating) more at night.  Feeling confused. How is this treated?     This condition may be treated with:  Medicines. These can be given to treat blood pressure and to make the heart muscles stronger.  Changes in your daily life. These may include eating a healthy diet, staying at a healthy body weight, quitting tobacco and illegal drug use, or doing exercises.  Surgery. Surgery can be done to open blocked valves, or to put devices in the heart, such as pacemakers.  A donor heart (heart transplant). You will receive a healthy heart from a donor. Follow these instructions at home:  Treat other conditions as told by your doctor. These may include high blood pressure, diabetes, thyroid disease, or abnormal heart rhythms.  Learn as much as you can about heart failure.  Get support as you need it.  Keep all follow-up visits as told by your doctor. This is important. Summary  Heart failure means that your heart is not able to pump blood in the right way.  This condition is caused by high blood pressure, heart attack, or damage of the heart muscle.  Symptoms of this condition include shortness of breath and swelling of the feet, ankles, legs, or belly. You may also feel very tired or feel like you may vomit.  You may be treated with medicines, surgery, or changes in your daily life.  Treat other health conditions as told by your doctor. This information is not intended to replace advice given to you by your health care provider. Make sure you discuss any questions you have with your health care provider. Document Released: 10/24/2007 Document Revised: 04/03/2018 Document Reviewed: 04/03/2018 Elsevier Patient  Education  [place] on my medicine - ELIQUIS (apixaban)  This medication education was reviewed with me or my healthcare representative as part of my discharge preparation.  The pharmacist that spoke with me during my hospital stay was:  Einar Grad, [facility]  Why was Eliquis prescribed for you? Eliquis was prescribed for you to reduce the risk of a blood clot forming that can cause a stroke if you have a medical condition called atrial fibrillation (a type of irregular heartbeat).  What do You need to know about Eliquis ? Take your Eliquis TWICE DAILY - one tablet in the morning and one tablet in the evening with or without food. If you have difficulty swallowing the tablet whole please discuss with your pharmacist how to take the medication safely.  Take Eliquis exactly as prescribed by your doctor and DO NOT stop taking Eliquis without talking to the doctor who prescribed the medication.  Stopping may increase your risk of developing a stroke.  Refill your prescription before you run out.  After discharge, you should  have regular check-up appointments with your healthcare provider that is prescribing your Eliquis.  In the future your dose may need to be changed if your kidney function or weight changes by a significant amount or as you get older.  What do you do if you miss a dose? If you miss a dose, take it as soon as you remember on the same day and resume taking twice daily.  Do not take more than one dose of ELIQUIS at the same time to make up a missed dose.  Important Safety Information A possible side effect of Eliquis is bleeding. You should call your healthcare provider right away if you experience any of the following: ? Bleeding from an injury or your nose that does not stop. ? Unusual colored urine (red or dark brown) or unusual colored stools (red or black). ? Unusual bruising for unknown reasons. ? A serious fall or if you hit your head  (even if there is no bleeding).  Some medicines may interact with Eliquis and might increase your risk of bleeding or clotting while on Eliquis. To help avoid this, consult your healthcare provider or pharmacist prior to using any new prescription or non-prescription medications, including herbals, vitamins, non-steroidal anti-inflammatory drugs (NSAIDs) and supplements.  This website has more information on Eliquis (apixaban): http://www.eliquis.com/eliquis/home

## 2018-10-06 NOTE — Discharge Summary (Signed)
Physician Discharge Summary  Corey Palmer ZOX:[id number] DOB: 12-Jan-1971 DOA: 10/01/2018  PCP: London Pepper, MD  Admit date: 10/01/2018 Discharge date: 10/06/2018  Admitted From: Home Disposition: Home  Recommendations for Outpatient Follow-up:  1. Follow up with PCP in 1-2 weeks 2. Follow-up with cardiology, advanced heart failure team as scheduled 3. Please obtain BMP/CBC in one week 4. Started on spironolactone 12.5 mg p.o. daily 5. Transitioned from lisinopril to losartan; with cardiology eventually transitioning to [facility] 6. Increased fenofibrate dose 7. Continue to follow-up with PCP for further guidance regarding insulin titration for poorly controlled diabetes mellitus  Home Health: No Equipment/Devices: None  Discharge Condition: Stable CODE STATUS: Full code Diet recommendation: Heart Healthy / Carb Modified   History of present illness:  Corey Palmer is a 48 year-old male with history of chronic systolic CHF/NICM, EF of 81%, ICD, type 2 diabetes mellitus, stage III CKD, paroxysmal atrial fibrillation on Eliquis, obesity, hypertension, dyslipidemia, anxiety presented to the ER with worsening shortness of breath. Weight found to be 15 pounds above his dry weight. Admitted with acute on chronic systolic CHF.  Hospital course:  Acute on chronic systolic CHF/NICM, EF of 19% Patient presenting to the ED with progressive shortness of breath.  Found to be volume overloaded with 15 pound weight gain from his dry weight.  Cardiology was consulted follow during the hospital course.  He was started on IV furosemide 40 mg twice daily with excellent diuresis.  He was net negative 12.3L during the hospitalization and his discharge weight was 117kg.  Patient was continued on his home Coreg and digoxin.  His lisinopril was changed to losartan in anticipation of eventual change to [facility].  Patient will discharge home on furosemide 40 mg p.o. twice daily.  Spironolactone 12.5 mg p.o. daily  was also added to his regimen.  Patient instructed to maintain a low-salt diet, closely monitor daily weights.  Patient to follow-up with advanced heart failure team as scheduled following hospital discharge.  Hypokalemia Etiology likely from IV diuresis.  Repleted during hospitalization.  Was 3.8 at time of discharge.  Will resume his home potassium supplementation.  Stage III chronic kidney disease Baseline creatinine around 1.5.  Creatinine at time of discharge 1.06.  Type 2 diabetes mellitus Hemoglobin A1c 11.9 on 10/01/2018, poorly controlled. Continue Lantus 50 units and sliding scale insulin.  Blood sugars were fairly well controlled during hospitalization using this regimen, suspect dietary discretions outpatient.  Need follow-up with PCP for further guidance regarding insulin titration.  Paroxysmal atrial fibrillation Currently in sinus rhythm, rate controlled. Continue Coreg and Eliquis  Obesity BMI is 33.  Discussed with patient need for aggressive lifestyle changes and weight loss as this complicates all facets of care.  Generalized anxiety/PTSD:  Continue Prozac, Depakote, BuSpar  GERD: Continue PPI  OSA: Continue CPAP nightly  Discharge Diagnoses:  Active Problems:   HYPERTRIGLYCERIDEMIA   OBESITY   Essential hypertension, benign   Ischemic cardiomyopathy   Atrial fibrillation (HCC)   Uncontrolled type 2 diabetes mellitus with hyperglycemia (HCC)   GAD (generalized anxiety disorder)   Acute on chronic systolic CHF (congestive heart failure) (HCC)   CHF, acute on chronic [facility])    Discharge Instructions  Discharge Instructions    (HEART FAILURE PATIENTS) Call MD:  Anytime you have any of the following symptoms: 1) 3 pound weight gain in 24 hours or 5 pounds in 1 week 2) shortness of breath, with or without a dry hacking cough 3) swelling in the hands, feet  or stomach 4) if you have to sleep on extra pillows at night in order to breathe.   Complete by: As  directed    Call MD for:  difficulty breathing, headache or visual disturbances   Complete by: As directed    Call MD for:  extreme fatigue   Complete by: As directed    Call MD for:  persistant dizziness or light-headedness   Complete by: As directed    Call MD for:  persistant nausea and vomiting   Complete by: As directed    Call MD for:  severe uncontrolled pain   Complete by: As directed    Call MD for:  temperature >100.4   Complete by: As directed    Diet - low sodium heart healthy   Complete by: As directed    Heart Failure patients record your daily weight using the same scale at the same time of day   Complete by: As directed    Increase activity slowly   Complete by: As directed      Allergies as of 10/06/2018      Reactions   Orange Fruit Anaphylaxis   Per Pt- Blister around lips and Hives all over    Penicillins Anaphylaxis   Did it involve swelling of the face/tongue/throat, SOB, or low BP? Yes Did it involve sudden or severe rash/hives, skin peeling, or any reaction on the inside of your mouth or nose? Yes Did you need to seek medical attention at a hospital or doctor's office? No When did it last happen?childhood If all above answers are "NO", may proceed with cephalosporin use.   Jardiance [empagliflozin] Itching      Medication List    STOP taking these medications   lisinopril 20 MG tablet Commonly known as: ZESTRIL     TAKE these medications   apixaban 5 MG Tabs tablet Commonly known as: ELIQUIS Take 1 tablet (5 mg total) by mouth 2 (two) times daily.   aspirin 81 MG EC tablet Take 1 tablet (81 mg total) by mouth daily. Start taking on: October 07, 2018   atorvastatin 80 MG tablet Commonly known as: LIPITOR Take 1 tablet (80 mg total) by mouth daily. What changed: when to take this   busPIRone 10 MG tablet Commonly known as: BUSPAR Take 1 tablet (10 mg total) by mouth 3 (three) times daily. What changed:   how much to take  when to  take this  reasons to take this   carvedilol 25 MG tablet Commonly known as: COREG Take 1 tablet (25 mg total) by mouth 2 (two) times daily.   digoxin 0.125 MG tablet Commonly known as: LANOXIN Take 1 tablet (0.125 mg total) by mouth daily.   divalproex 500 MG 24 hr tablet Commonly known as: DEPAKOTE ER Take 1 tablet (500 mg total) by mouth daily.   Dulaglutide 1.5 MG/0.5ML Sopn Commonly known as: Trulicity Inject 1.5 mg into the skin once a week. What changed: when to take this   fenofibrate 160 MG tablet Take 1 tablet (160 mg total) by mouth at bedtime. What changed:   medication strength  how much to take   FLUoxetine 20 MG capsule Commonly known as: PROZAC Take 4 capsules (80 mg total) by mouth daily.   furosemide 20 MG tablet Commonly known as: LASIX Take 40 mg by mouth 2 (two) times daily.   glucose blood test strip Commonly known as: FreeStyle Precision Neo Test 1 strip and 1 lancet, each 10 per day.  Uses Omnipod device.   hydrOXYzine 50 MG capsule Commonly known as: VISTARIL Take 1 capsule (50 mg total) by mouth 3 (three) times daily as needed for anxiety.   Insulin Glargine 100 UNIT/ML Solostar Pen Commonly known as: Lantus SoloStar Inject 50 Units into the skin every morning.   insulin lispro 100 UNIT/ML injection Commonly known as: HUMALOG Inject 5-10 Units into the skin See admin instructions. Inject depending upon blood sugar reading: base unit is 5 increase by 1 units per 50 blood sugar reading starting at 150.   losartan 50 MG tablet Commonly known as: COZAAR Take 1 tablet (50 mg total) by mouth daily. Start taking on: October 07, 2018   magnesium oxide 400 MG tablet Commonly known as: MAG-OX Take 400 mg by mouth daily.   omeprazole 40 MG capsule Commonly known as: PRILOSEC Take 1 capsule (40 mg total) by mouth daily.   potassium chloride SA 20 MEQ tablet Commonly known as: K-DUR Take 1 tablet (20 mEq total) by mouth daily. What  changed: when to take this   spironolactone 25 MG tablet Commonly known as: Aldactone Take 0.5 tablets (12.5 mg total) by mouth daily.      Follow-up Information    London Pepper, MD. Schedule an appointment as soon as possible for a visit in 1 week(s).   Specialty: Family Medicine Contact information: [address] [place] [address] [phone]        Larey Dresser, MD .   Specialty: Cardiology Contact information: [place] [address] [phone]        Evans Lance, MD .   Specialty: Cardiology Contact information: [phone] [place] [address] [phone]          Allergies  Allergen Reactions  . Orange Fruit Anaphylaxis    Per Pt- Blister around lips and Hives all over   . Penicillins Anaphylaxis    Did it involve swelling of the face/tongue/throat, SOB, or low BP? Yes Did it involve sudden or severe rash/hives, skin peeling, or any reaction on the inside of your mouth or nose? Yes Did you need to seek medical attention at a hospital or doctor's office? No When did it last happen?childhood If all above answers are "NO", may proceed with cephalosporin use.  [name] [Empagliflozin] Itching    Consultations:  Cardiology   Procedures/Studies: Dg Chest 2 View  Result Date: 10/01/2018 CLINICAL DATA:  Shortness of breath, chest pain EXAM: CHEST - 2 VIEW COMPARISON:  06/10/2013 FINDINGS: Cardiomegaly status post median sternotomy with left chest single lead pacer defibrillator. Mild diffuse interstitial pulmonary opacity. The visualized skeletal structures are unremarkable. IMPRESSION: Cardiomegaly with mild diffuse interstitial pulmonary opacity, likely edema. No focal airspace opacity. Electronically Signed   By: Eddie Candle M.D.   On: 10/01/2018 10:46   [place] Ekg Site Rite  Result Date: 10/02/2018 If Site Rite image not attached, placement could not be confirmed due to current  cardiac rhythm.     Subjective: Patient seen and examined at bedside, sitting at side of bed.  No complaints.  Breathing remarkably improved and back to his normal baseline.  No active complaints at this time.  Cardiology okay for discharge home.  Denies headache, no fever/chills/night sweats, no nausea some vomiting/diarrhea, no chest pain, no palpitations, no shortness of breath, no abdominal pain, no weakness, no issues with bladder/bowel function, no paresthesia.  No acute events overnight per nursing staff.   Discharge Exam:  Vitals:   10/06/18 0829 10/06/18 1114  BP: 115/87 111/78  Pulse: 75 77  Resp: (!) 21   Temp: 98.7 F (37.1 C)   SpO2: 97% 97%   Vitals:   10/05/18 2021 10/06/18 0500 10/06/18 0829 10/06/18 1114  BP: (!) 134/99  115/87 111/78  Pulse:   75 77  Resp:   (!) 21   Temp: 100.1 F (37.8 C)  98.7 F (37.1 C)   TempSrc:   Oral   SpO2:   97% 97%  Weight:  117 kg    Height:        General: Pt is alert, awake, not in acute distress, obese Cardiovascular: RRR, S1/S2 +, no rubs, no gallops Respiratory: CTA bilaterally, no wheezing, no rhonchi Abdominal: Soft, NT, ND, bowel sounds + Extremities: no edema, no cyanosis    The results of significant diagnostics from this hospitalization (including imaging, microbiology, ancillary and laboratory) are listed below for reference.     Microbiology: Recent Results (from the past 240 hour(s))  SARS CORONAVIRUS 2 (TAT 6-24 HRS) Nasopharyngeal Nasopharyngeal Swab     Status: None   Collection Time: 10/01/18  3:01 PM   Specimen: Nasopharyngeal Swab  Result Value Ref Range Status   SARS Coronavirus 2 NEGATIVE NEGATIVE Final    Comment: (NOTE) SARS-CoV-2 target nucleic acids are NOT DETECTED. The SARS-CoV-2 RNA is generally detectable in upper and lower respiratory specimens during the acute phase of infection. Negative results do not preclude SARS-CoV-2 infection, do not rule out co-infections with other pathogens,  and should not be used as the sole basis for treatment or other patient management decisions. Negative results must be combined with clinical observations, patient history, and epidemiological information. The expected result is Negative. Fact Sheet for Patients: SugarRoll.be Fact Sheet for Healthcare Providers: https://www.woods-mathews.com/ This test is not yet approved or cleared by the [place] FDA and  has been authorized for detection and/or diagnosis of SARS-CoV-2 by FDA under an Emergency Use Authorization (EUA). This EUA will remain  in effect (meaning this test can be used) for the duration of the COVID-19 declaration under Section 56 4(b)(1) of the Act, 21 U.S.C. section 360bbb-3(b)(1), unless the authorization is terminated or revoked sooner. Performed at [facility], [place] [address], [place], [place] [address]      Labs: BNP (last 3 results) Recent Labs    10/01/18 1253  BNP 660.6*   Basic Metabolic Panel: Recent Labs  Lab 10/01/18 1515 10/02/18 0841 10/03/18 0300 10/04/18 0410 10/05/18 0455 10/06/18 0525  NA  --  134* 135 140 137 139  K  --  4.8 4.2 3.3* 4.0 3.8  CL  --  100 100 104 100 107  CO2  --  24 25 28 28 27   GLUCOSE  --  227* 185* 91 148* 92  BUN  --  37* 40* 31* 27* 20  CREATININE 1.55* 1.74* 1.77* 1.28* 1.18 1.06  CALCIUM  --  8.6* 8.2* 8.2* 8.3* 8.0*  MG 1.7 1.9  --   --  1.8 1.6*  PHOS 5.0*  --   --   --   --   --    Liver Function Tests: No results for input(s): AST, ALT, ALKPHOS, BILITOT, PROT, ALBUMIN in the last 168 hours. Recent Labs  Lab 10/01/18 1233  LIPASE 37   No results for input(s): AMMONIA in the last 168 hours. CBC: Recent Labs  Lab 10/01/18 1029 10/01/18 1602 10/02/18 0841  WBC 7.6 7.1 7.2  HGB 13.9 14.8 15.3  HCT 43.0 45.2 47.6  MCV 84.8 83.9 85.9  PLT 219 229 219   Cardiac Enzymes: No results for input(s): CKTOTAL, CKMB, CKMBINDEX, TROPONINI in the last  168 hours. BNP: Invalid input(s): POCBNP CBG: Recent Labs  Lab 10/05/18 0627 10/05/18 1217 10/05/18 1637 10/05/18 2152 10/06/18 0623  GLUCAP 179* 203* 391* 173* 111*   D-Dimer No results for input(s): DDIMER in the last 72 hours. Hgb A1c No results for input(s): HGBA1C in the last 72 hours. Lipid Profile No results for input(s): CHOL, HDL, LDLCALC, TRIG, CHOLHDL, LDLDIRECT in the last 72 hours. Thyroid function studies No results for input(s): TSH, T4TOTAL, T3FREE, THYROIDAB in the last 72 hours.  Invalid input(s): FREET3 Anemia work up No results for input(s): VITAMINB12, FOLATE, FERRITIN, TIBC, IRON, RETICCTPCT in the last 72 hours. Urinalysis    Component Value Date/Time   COLORURINE YELLOW 10/01/2018 1345   APPEARANCEUR CLEAR 10/01/2018 1345   LABSPEC 1.012 10/01/2018 1345   PHURINE 5.0 10/01/2018 1345   GLUCOSEU NEGATIVE 10/01/2018 1345   HGBUR NEGATIVE 10/01/2018 1345   BILIRUBINUR NEGATIVE 10/01/2018 1345   KETONESUR NEGATIVE 10/01/2018 1345   PROTEINUR 100 (A) 10/01/2018 1345   UROBILINOGEN 1.0 10/14/2011 1924   NITRITE NEGATIVE 10/01/2018 1345   LEUKOCYTESUR NEGATIVE 10/01/2018 1345   Sepsis Labs Invalid input(s): PROCALCITONIN,  WBC,  LACTICIDVEN Microbiology Recent Results (from the past 240 hour(s))  SARS CORONAVIRUS 2 (TAT 6-24 HRS) Nasopharyngeal Nasopharyngeal Swab     Status: None   Collection Time: 10/01/18  3:01 PM   Specimen: Nasopharyngeal Swab  Result Value Ref Range Status   SARS Coronavirus 2 NEGATIVE NEGATIVE Final    Comment: (NOTE) SARS-CoV-2 target nucleic acids are NOT DETECTED. The SARS-CoV-2 RNA is generally detectable in upper and lower respiratory specimens during the acute phase of infection. Negative results do not preclude SARS-CoV-2 infection, do not rule out co-infections with other pathogens, and should not be used as the sole basis for treatment or other patient management decisions. Negative results must be combined with  clinical observations, patient history, and epidemiological information. The expected result is Negative. Fact Sheet for Patients: SugarRoll.be Fact Sheet for Healthcare Providers: https://www.woods-mathews.com/ This test is not yet approved or cleared by the [place] FDA and  has been authorized for detection and/or diagnosis of SARS-CoV-2 by FDA under an Emergency Use Authorization (EUA). This EUA will remain  in effect (meaning this test can be used) for the duration of the COVID-19 declaration under Section 56 4(b)(1) of the Act, 21 U.S.C. section 360bbb-3(b)(1), unless the authorization is terminated or revoked sooner. Performed at [facility], [place] [address], [place], [place] [address]      Time coordinating discharge: Over 30 minutes  SIGNED:   Jafet Wissing J [place] DO  [facility] 10/06/2018, 11:37 AM

## 2018-10-06 NOTE — Progress Notes (Addendum)
Progress Note  Patient Name: Corey Palmer Date of Encounter: 10/06/2018  Primary Cardiologist: Dr. Aundra Dubin Electrophysiologist: Dr. Lovena Le  Subjective   No complaints this morning. Feels much better compared to day of admission. Denies dyspnea. Orthopnea/ PND resolved. No exertional dyspnea or CP currently. Asking if he can go home.   Inpatient Medications    Scheduled Meds: . apixaban  5 mg Oral BID  . aspirin EC  81 mg Oral Daily  . atorvastatin  80 mg Oral QHS  . busPIRone  10 mg Oral TID  . carvedilol  25 mg Oral BID  . digoxin  0.125 mg Oral Daily  . divalproex  500 mg Oral Daily  . fenofibrate  160 mg Oral QHS  . FLUoxetine  80 mg Oral Daily  . furosemide  40 mg Intravenous Q12H  . insulin aspart  0-15 Units Subcutaneous TID WC  . insulin aspart  0-5 Units Subcutaneous QHS  . insulin glargine  50 Units Subcutaneous BH-q7a  . losartan  50 mg Oral Daily  . magnesium oxide  400 mg Oral Daily  . potassium chloride  10 mEq Oral Daily  . sodium chloride flush  10-40 mL Intracatheter Q12H  . sodium chloride flush  3 mL Intravenous Once  . sodium chloride flush  3 mL Intravenous Q12H   Continuous Infusions: . sodium chloride     PRN Meds: sodium chloride, acetaminophen, diphenhydrAMINE-zinc acetate, hydrOXYzine, nitroGLYCERIN, sodium chloride flush, sodium chloride flush   Vital Signs    Vitals:   10/05/18 1607 10/05/18 2021 10/06/18 0500 10/06/18 0829  BP: (!) 133/92 (!) 134/99  115/87  Pulse: 82   75  Resp: 19   (!) 21  Temp: 98.5 F (36.9 C) 100.1 F (37.8 C)  98.7 F (37.1 C)  TempSrc: Oral   Oral  SpO2: 97%   97%  Weight:   117 kg   Height:        Intake/Output Summary (Last 24 hours) at 10/06/2018 0940 Last data filed at 10/06/2018 4854 Gross per 24 hour  Intake 240 ml  Output 4750 ml  Net -4510 ml   Last 3 Weights 10/06/2018 10/05/2018 10/04/2018  Weight (lbs) 258 lb 262 lb 4.8 oz 265 lb 6.9 oz  Weight (kg) 117.028 kg 118.978 kg 120.4 kg  Some  encounter information is confidential and restricted. Go to Review Flowsheets activity to see all data.      Telemetry    NSR. No adverse arrthymias - Personally Reviewed  ECG    No new EKGs to review today - Personally Reviewed  Physical Exam   GEN: obese early middle aged WM in no acute distress.   Neck: thick neck, No JVD Cardiac: RRR, no murmurs, rubs, or gallops.  Respiratory: Clear to auscultation bilaterally. GI: obese abdomen, nontender, non-distended  MS: No edema; No deformity. Neuro:  Nonfocal  Psych: Normal affect   Labs    High Sensitivity Troponin:   Recent Labs  Lab 10/01/18 1233 10/01/18 1515 10/01/18 1804  TROPONINIHS 21* 21* 22*      Chemistry Recent Labs  Lab 10/04/18 0410 10/05/18 0455 10/06/18 0525  NA 140 137 139  K 3.3* 4.0 3.8  CL 104 100 107  CO2 28 28 27   GLUCOSE 91 148* 92  BUN 31* 27* 20  CREATININE 1.28* 1.18 1.06  CALCIUM 8.2* 8.3* 8.0*  GFRNONAA >60 >60 >60  GFRAA >60 >60 >60  ANIONGAP 8 9 5      Hematology Recent Labs  Lab 10/01/18 1029 10/01/18 1602 10/02/18 0841  WBC 7.6 7.1 7.2  RBC 5.07 5.39 5.54  HGB 13.9 14.8 15.3  HCT 43.0 45.2 47.6  MCV 84.8 83.9 85.9  MCH 27.4 27.5 27.6  MCHC 32.3 32.7 32.1  RDW 14.2 14.5 14.5  PLT 219 229 219    BNP Recent Labs  Lab 10/01/18 1253  BNP 659.0*     DDimer No results for input(s): DDIMER in the last 168 hours.   Radiology    No results found.  Cardiac Studies   2D Echo 08/29/18 (done at [facility] - see scanned report 8/3) Severely reduced LV systolic function, EF 69%, w/ diffuse hypokinesis Mild- moderate Corey Mildly reduced RV systolic function Mild pulmonary HTN, PA pressure 40 mmHg.   LHC 08/19/2000 LM- no stenosis LAD - 40% stenosis in mid portion D1 - no disease D2 - no disease LCx - no disease RCA, PDA and PLB - no disease    Patient Profile     48 y.o. male hx of NICM, nonobstructive CAD (LHC in 2002 with 40% LAD stenosis), with LVEF  10-15% back in 2008, later with improvement to 25-30%, most recent outside echocardiogram from 09/01/2018 with LVEF 10-15%. He also has h/o ICD placement in 2008(complicated by RA perforation and tamponade), generator change while in prison, poorly controlled DM II with peripheral neuropathy, obesity,PAF on Eliquis, DM, HTN, bipolar disorder, GERD, OSA not on CPAP(currently doesn't own a CPAP), CKD III,who is being seen today for the evaluation ofCHFat the request of Dr Broadus John. Corey Palmer was seen by Dr Aundra Dubin in 2013, he was lost for follow up (he was incarcerated for some time) until 09/2017 when he saw Dr Lovena Le.   Assessment & Plan    1. Acute on Chronic Systolic CHF: presented w/ worsening dyspnea, orthopnea, PND and 20 lb wt gain. Admit BNP elevated at 659. CXR showed cardiomegaly and findings c/w mild pulmonary edema.   Good diuresis w/ IV Lasix w/ an addition 4L out in U[facility] yesterday. Net I/Os negative 13.7L  Daily weights have been unreliable   Pt notes significant subjective improvement. SOB, exertional dyspnea, orthopnea and PND resolved  He is obese making examination difficult, but no LEE on exam  CVP normal this AM at 7 mmHg  Will covert to PO Lasix today, was on 40 mg BID as outpatient   Will need to adopt low salt diet and daily weights at home once discharge  Close outpatient f/u in the AHF clinic  If volume control becomes difficult and if recurrent hospitalizations, consider cardiomems for close outpatient monitoring of volume status and home health diuretic protocol    2. NICM: EF 15%. CM dates back to 2002. LHC 7/02 showed mild single vessel, nonobstructive CAD. 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 He may benefit from [place]  3. HTN: controlled on current regimen.   4. Atrial Fibrillation: NSR on tele. Rate is controlled.  Continue  blocker + digoxin  Continue Eliquis for a/c.    5. CAD: remote LHC in  2002. Only 40% mid LAD stenosis at time of cath. No other disease noted.   Denies CP.   Continue medical therapy w/ ASA, statin and  blocker   6. CKD: AKI resolved.  SCr down from 1.28>>1.06. GFR improved from 42 to > 60.   7. T2DM: poorly controlled. Hgb A1c 11.9.   Given concomitant systolic HF w/ EF < 82% consider addition of SGLT2i   8. ICD: St Jude single chamber ICD, implanted for primary prevention given chronic systolic HF. Last device interrogation 09/08/18. Histograms appropriate. No VT/VF. Leads and battery were stable.  Continue routien f/u in EP clinic as directed   For questions or updates, please contact [facility] Please consult [email or web address] for contact info under        Signed, Lyda Jester, PA-C  10/06/2018, 9:40 AM    History and all data above reviewed.  Patient examined.  I agree with the findings as above.  The patient exam reveals COR:RRR  ,  Lungs: Clear  ,  Abd: Positive bowel sounds, no rebound no guarding, Ext mild edema  .  All available labs, radiology testing, previous records reviewed. Agree with documented assessment and plan. ACUTE ON CHRONIC SYSTOLIC HF:  He responded very well to diuresis with stable renal function and pressures.  He will need follow up in the Advanced HF Clinic but I do not think that he needs any further hospital management.  We will arrange follow up.    Quianna Avery  11:00 AM  10/06/2018

## 2018-10-08 ENCOUNTER — Encounter (HOSPITAL_COMMUNITY): Payer: Self-pay

## 2018-10-12 ENCOUNTER — Other Ambulatory Visit (HOSPITAL_COMMUNITY): Payer: Self-pay | Admitting: *Deleted

## 2018-10-14 DIAGNOSIS — I509 Heart failure, unspecified: Secondary | ICD-10-CM | POA: Diagnosis not present

## 2018-10-14 DIAGNOSIS — I10 Essential (primary) hypertension: Secondary | ICD-10-CM | POA: Diagnosis not present

## 2018-10-14 DIAGNOSIS — E1165 Type 2 diabetes mellitus with hyperglycemia: Secondary | ICD-10-CM | POA: Diagnosis not present

## 2018-10-15 DIAGNOSIS — I509 Heart failure, unspecified: Secondary | ICD-10-CM | POA: Diagnosis not present

## 2018-10-15 DIAGNOSIS — N289 Disorder of kidney and ureter, unspecified: Secondary | ICD-10-CM | POA: Diagnosis not present

## 2018-11-05 ENCOUNTER — Other Ambulatory Visit: Payer: Self-pay | Admitting: Internal Medicine

## 2018-11-11 ENCOUNTER — Ambulatory Visit (HOSPITAL_COMMUNITY)
Admission: RE | Admit: 2018-11-11 | Discharge: 2018-11-11 | Disposition: A | Discharge status: Home / Self Care | Payer: BC Managed Care – PPO | Source: Ambulatory Visit | Attending: Cardiology | Admitting: Cardiology

## 2018-11-11 ENCOUNTER — Encounter (HOSPITAL_COMMUNITY): Payer: Self-pay | Admitting: Cardiology

## 2018-11-11 ENCOUNTER — Other Ambulatory Visit: Payer: Self-pay

## 2018-11-11 VITALS — BP 131/82 | HR 80 | Wt 250.6 lb

## 2018-11-11 DIAGNOSIS — R9431 Abnormal electrocardiogram [ECG] [EKG]: Secondary | ICD-10-CM | POA: Insufficient documentation

## 2018-11-11 DIAGNOSIS — E1142 Type 2 diabetes mellitus with diabetic polyneuropathy: Secondary | ICD-10-CM | POA: Diagnosis not present

## 2018-11-11 DIAGNOSIS — I420 Dilated cardiomyopathy: Secondary | ICD-10-CM | POA: Diagnosis not present

## 2018-11-11 DIAGNOSIS — Z9119 Patient's noncompliance with other medical treatment and regimen: Secondary | ICD-10-CM | POA: Diagnosis not present

## 2018-11-11 DIAGNOSIS — Z794 Long term (current) use of insulin: Secondary | ICD-10-CM | POA: Insufficient documentation

## 2018-11-11 DIAGNOSIS — Z7901 Long term (current) use of anticoagulants: Secondary | ICD-10-CM | POA: Diagnosis not present

## 2018-11-11 DIAGNOSIS — I2510 Atherosclerotic heart disease of native coronary artery without angina pectoris: Secondary | ICD-10-CM | POA: Diagnosis not present

## 2018-11-11 DIAGNOSIS — E785 Hyperlipidemia, unspecified: Secondary | ICD-10-CM | POA: Diagnosis not present

## 2018-11-11 DIAGNOSIS — Z8249 Family history of ischemic heart disease and other diseases of the circulatory system: Secondary | ICD-10-CM | POA: Diagnosis not present

## 2018-11-11 DIAGNOSIS — G4733 Obstructive sleep apnea (adult) (pediatric): Secondary | ICD-10-CM | POA: Diagnosis not present

## 2018-11-11 DIAGNOSIS — Z79899 Other long term (current) drug therapy: Secondary | ICD-10-CM | POA: Diagnosis not present

## 2018-11-11 DIAGNOSIS — I5022 Chronic systolic (congestive) heart failure: Secondary | ICD-10-CM | POA: Diagnosis not present

## 2018-11-11 DIAGNOSIS — I447 Left bundle-branch block, unspecified: Secondary | ICD-10-CM | POA: Diagnosis not present

## 2018-11-11 DIAGNOSIS — I428 Other cardiomyopathies: Secondary | ICD-10-CM

## 2018-11-11 DIAGNOSIS — I480 Paroxysmal atrial fibrillation: Secondary | ICD-10-CM | POA: Diagnosis not present

## 2018-11-11 DIAGNOSIS — Z95810 Presence of automatic (implantable) cardiac defibrillator: Secondary | ICD-10-CM | POA: Diagnosis not present

## 2018-11-11 LAB — BASIC METABOLIC PANEL
Anion gap: 13 (ref 5–15)
BUN: 23 mg/dL — ABNORMAL HIGH (ref 6–20)
CO2: 27 mmol/L (ref 22–32)
Calcium: 8.7 mg/dL — ABNORMAL LOW (ref 8.9–10.3)
Chloride: 90 mmol/L — ABNORMAL LOW (ref 98–111)
Creatinine, Ser: 1.46 mg/dL — ABNORMAL HIGH (ref 0.61–1.24)
GFR calc Af Amer: >60 mL/min (ref >60)
GFR calc non Af Amer: 56 mL/min — ABNORMAL LOW (ref >60)
Glucose, Bld: 672 mg/dL (ref 70–99)
Potassium: 4.3 mmol/L (ref 3.5–5.1)
Sodium: 130 mmol/L — ABNORMAL LOW (ref 135–145)

## 2018-11-11 LAB — DIGOXIN LEVEL: Digoxin Level: <0.2 ng/mL — ABNORMAL LOW (ref 0.8–2.0)

## 2018-11-11 MED ORDER — SPIRONOLACTONE 25 MG PO TABS: 25.0000 mg | ORAL_TABLET | Freq: Every day | ORAL | 5 refills | Status: DC

## 2018-11-11 MED ORDER — FUROSEMIDE 20 MG PO TABS: ORAL_TABLET | ORAL | 3 refills | Status: DC

## 2018-11-11 MED ORDER — ENTRESTO 24-26 MG PO TABS: 1.0000 | ORAL_TABLET | Freq: Two times a day (BID) | ORAL | 5 refills | Status: DC

## 2018-11-11 NOTE — Progress Notes (Signed)
Patient ID: Corey Palmer, male   DOB: May 30, 1970, 48 y.o.   MRN: [id number] PCP: London Pepper, MD Cardiology: Dr. Aundra Dubin  48 y.o. with history of nonischemic cardiomyopathy and paroxysmal atrial fibrillation was referred by Dr. Percival Spanish for evaluation of CHF.  He has had a nonischemic cardiomyopathy known since the early 2000s.  His father and mother both had CHF and died in their 38s. He does not drink ETOH or smoke.  He has a Research officer, political party ICD.  LHC in 2002 showed 40% LAD stenosis (nonobstructive).  Last echo in 8/20 from the hospital in [place] showed EF 15% with mildly decreased RV systolic function.  This is lower than in the past, where EF has been in the 25% range.  He has had paroxysmal atrial fibrillation and is on Eliquis.  He has type 2 diabetes and is now on insulin.  He has OSA but does not have CPAP. He was off his cardiac meds for about 2 years but restarted earlier this year.  He was admitted in 8/20 in [place] and again in 9/20 at [facility] with CHF.    Since 9/20 discharge, he has been doing well.  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  His work is moderately strenuous, and he is currently having no problems at work.  At the time, he denies any significant exertional dyspnea.  No orthopnea/PND.  No lightheadedness.  No chest pain. He is in NSR today, no palpitations.   ECG (personally reviewed): NSR, inferolateral TWIs, narrow QRS.   Labs (6/13): K 4.4, creatinine 1.0, BNP 2793, digoxin 0.6 Labs (7/13): K 3.3, creatinine 0.9, digoxin 0.5 Labs (8/13): BNP 121 Labs (9/13): K 3.7, creatinine 0.89 Labs (10/13): K 4.1, creatinine 1.0, BNP 164, TGs 537, HDL 27, LDL 116 Labs (9/20): K 3.8, creatinine 1.06  PMH:  1. Type II diabetes with peripheral neuropathy.  2. Hyperlipidemia 3. Atrial fibrillation: Paroxysmal. He was on dofetilide in the past but was not compliant with it.  He was taken off coumadin with upper GI bleed in 3/12 but is now back on apixaban.  4. Upper  GI bleed: From antral ulcers, was in 3/12.  5. Gout 6. Dilated cardiomyopathy: Nonischemic.  St Jude ICD in 9528 (complicated by RA perforation and tamponade).  LHC in 2002 with 40% LAD stenosis. Echo (6/13) with EF 20%, akinetic anteroseptum, moderate MR, severe TR, moderately dilated RV.  - Echo 11/10: EF 25-30%.  - Echo 8/12: EF 20-25% - Echo 4/15: EF 20-25% - Echo 8/20: EF 15%, diffuse hypokinesis, mildly decreased RV systolic function, mild-moderate MR.  7. CAD: LHC 2002 with 40% LAD.  8. OSA  SH: Married with 3 children, 1 son served in [place].  No ETOH, no smoking.  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     FH: Mother and father both had CHF, uncertain etiology, and died in their 8s.    ROS: All systems reviewed and negative except as per HPI.   Current Outpatient Medications  Medication Sig Dispense Refill  . apixaban (ELIQUIS) 5 MG TABS tablet Take 1 tablet (5 mg total) by mouth 2 (two) times daily. 180 tablet 3  . atorvastatin (LIPITOR) 80 MG tablet Take 1 tablet (80 mg total) by mouth daily. (Patient taking differently: Take 80 mg by mouth at bedtime. ) 90 tablet 3  . carvedilol (COREG) 25 MG tablet Take 1 tablet (25 mg total) by mouth 2 (two) times daily. 180 tablet 3  .  digoxin (LANOXIN) 0.125 MG tablet Take 1 tablet (0.125 mg total) by mouth daily. 90 tablet 3  . divalproex (DEPAKOTE ER) 500 MG 24 hr tablet Take 1 tablet (500 mg total) by mouth daily. 90 tablet 0  . Dulaglutide (TRULICITY) 1.5 WP/8.0DX SOPN Inject 1.5 mg into the skin once a week. (Patient taking differently: Inject 1.5 mg into the skin every Sunday. ) 12 pen 3  . fenofibrate 160 MG tablet Take 1 tablet (160 mg total) by mouth at bedtime. 90 tablet 0  . FLUoxetine (PROZAC) 20 MG capsule Take 4 capsules (80 mg total) by mouth daily. 120 capsule 2  . furosemide (LASIX) 20 MG tablet Take 40mg (2 tabs) by mouth every morning and 20mg (1 tab) every evening 360 tablet 3  . glucose blood (FREESTYLE PRECISION  NEO TEST) test strip 1 strip and 1 lancet, each 10 per day.  Uses Omnipod device. 900 each 3  . Insulin Glargine (LANTUS SOLOSTAR) 100 UNIT/ML Solostar Pen Inject 50 Units into the skin every morning. 10 pen PRN  . insulin lispro (HUMALOG) 100 UNIT/ML injection Inject 5-10 Units into the skin See admin instructions. Inject depending upon blood sugar reading: base unit is 5 increase by 1 units per 50 blood sugar reading starting at 150.    . magnesium oxide (MAG-OX) 400 MG tablet Take 400 mg by mouth daily.    Marland Kitchen omeprazole (PRILOSEC) 40 MG capsule Take 1 capsule (40 mg total) by mouth daily. 30 capsule 3  . potassium chloride SA (K-DUR,KLOR-CON) 20 MEQ tablet Take 1 tablet (20 mEq total) by mouth daily. (Patient taking differently: Take 20 mEq by mouth at bedtime. ) 90 tablet 3  . spironolactone (ALDACTONE) 25 MG tablet Take 1 tablet (25 mg total) by mouth daily. 30 tablet 5  . sacubitril-valsartan (ENTRESTO) 24-26 MG Take 1 tablet by mouth 2 (two) times daily. 60 tablet 5   No current facility-administered medications for this encounter.     BP 131/82   Pulse 80   Wt 113.7 kg (250 lb 9.6 oz)   SpO2 96%   BMI 31.32 kg/m  General: NAD Neck: Thic, no JVD, no thyromegaly or thyroid nodule.  Lungs: Clear to auscultation bilaterally with normal respiratory effort. CV: Nondisplaced PMI.  Heart regular S1/S2, no S3/S4, no murmur.  No peripheral edema.  No carotid bruit.  Normal pedal pulses.  Abdomen: Soft, nontender, no hepatosplenomegaly, no distention.  Skin: Intact without lesions or rashes.  Neurologic: Alert and oriented x 3.  Psych: Normal affect. Extremities: No clubbing or cyanosis.  HEENT: Normal.   Assessment/Plan: 1. Atrial fibrillation: Patient remains in NSR. He was on dofetilide in the remote past but this was stopped due to noncompliance.  He had an upper GI bleed from antral ulcers in 3/12. He was seen by GI and was deemed safe to restart anticoagulation as long as he remains  on a PPI.  - Continue Protonix.  - Continue apixaban 5 mg bid.  He can stop ASA given use of apixaban.  2. CAD: Nonobstructive. Continue atorvastatin.  3. Chronic systolic CHF: Nonischemic cardiomyopathy.  Possible familial cardiomyopathy as both parents had cardiomyopathy and died at round 64. St Jude ICD.  Echo in 8/20 with EF 15% and mildly decreased RV function.  Despite markedly low EF, he reports minimal symptoms, NYHA class I-II.  He does not look volume overloaded on exam.  - Continue digoxin, check level.  - Continue Coreg 25 mg bid.  - Stop losartan, start Entresto 24/26  bid.  BMET today and again in 10 days.  - He can increase spironolactone to 25 mg daily.  - With transition to [facility], he can decrease Lasix to 40 qam/20 qpm.   - I will arrange for CPX.  - Strong family history of cardiomyopathy.  We discussed gene testing.  This would not help him but could help identify a mutation linked to cardiomyopathy amongst his 3 children.  I will order [facility] gene testing for dilated cardiomyopathy.  4. OSA: OSA on sleep study, still does not have CPAP.  - Will see if Dr. Radford Pax can arrange.    Followup with me in 1 month.   Loralie Champagne  11/12/2018

## 2018-11-11 NOTE — Progress Notes (Signed)
Blood collected for cardiomyopathy genetic testing by [facility] per Dr Aundra Dubin.  Order form completed and both shipped by [employer] to [facility].

## 2018-11-11 NOTE — Patient Instructions (Addendum)
Labs done today. We will contact you if your labs are abnormal.  Genetic test has been done, this has to be sent to [place] to be processed and can take 1-2 weeks to get results back.  We will let you know the results.   DISCONTINUE Asprin.  DISCONTINUE Losartan   START Entresto 24-26mg  by mouth two times daily. (discount card given to patient in office)  INCREASE Spirolactone 25mg  by mouth once daily  DECREASE Lasix 40mg  every morning and 20mg  every evening.   Your physician recommends that you schedule a follow-up appointment in: 10 days for a lab only appointment and in 1 month to see Dr. Aundra Dubin.  Your physician has recommended that you have a cardiopulmonary stress test (CPX). CPX testing is a non-invasive measurement of heart and lung function. It replaces a traditional treadmill stress test. This type of test provides a tremendous amount of information that relates not only to your present condition but also for future outcomes. This test combines measurements of you ventilation, respiratory gas exchange in the lungs, electrocardiogram (EKG), blood pressure and physical response before, during, and following an exercise protocol.  At the [facility], you and your health needs are our priority. As part of our continuing mission to provide you with exceptional heart care, we have created designated Provider Care Teams. These Care Teams include your primary Cardiologist (physician) and Advanced Practice Providers (APPs- Physician Assistants and Nurse Practitioners) who all work together to provide you with the care you need, when you need it.   You may see any of the following providers on your designated Care Team at your next follow up: Marland Kitchen Dr Glori Bickers . Dr Loralie Champagne . Darrick Grinder, NP . Lyda Jester, PA   Please be sure to bring in all your medications bottles to every appointment.

## 2018-11-12 ENCOUNTER — Encounter: Payer: Self-pay | Admitting: *Deleted

## 2018-11-12 NOTE — Telephone Encounter (Signed)
  NAMELENWOOD, BALSAM NO.:  [id number]  MEDICAL RECORD NO.:  [id number]          PATIENT TYPE:  OUT  LOCATION:  SLEEP CENTER                 FACILITY:  [facility]  PHYSICIAN:  Kathee Delton, MD,FCCPDATE OF BIRTH:  [birth year]/68  DATE OF STUDY:  11/25/2011                           NOCTURNAL POLYSOMNOGRAM  REFERRING PHYSICIAN:  Loralie Champagne, MD  REFERRING PHYSICIAN:  Loralie Champagne, MD  INDICATION FOR STUDY:  Hypersomnia with sleep apnea.  EPWORTH SLEEPINESS SCORE:  15.  SLEEP ARCHITECTURE:  The patient was found to have a total sleep time of 233 minutes with very little slow-wave sleep or REM.  Sleep onset latency was normal at 17 minutes, and REM onset was normal at 91 minutes.  Sleep efficiency was severely reduced at 65%.  RESPIRATORY DATA:  The patient was found to have 5 apneas and 83 obstructive hypopneas, giving him an apnea/hypopnea index of 23 events per hour.  The events occurred in all body positions and there was loud snoring noted throughout.  OXYGEN DATA:  There was O2 desaturation as low as 87% with the patient's obstructive events.  CARDIAC DATA:  No clinically significant arrhythmias were noted.  MOVEMENT/PARASOMNIA:  The patient had no significant leg jerks or other abnormal behaviors seen.  IMPRESSION/RECOMMENDATION:  Moderate obstructive sleep apnea/hypopnea syndrome with an AHI of 23 events per hour and oxygen desaturation as low as 87%.  Treatment for this degree of sleep apnea can include a trial of weight loss alone, upper airway surgery, dental appliance, and also CPAP.  Clinical correlation is suggested.     Kathee Delton, MD,FCCP Diplomate, [place] Board of Sleep Medicine    [facility]/MEDQ  D:  12/03/2011 08:13:48  T:  12/03/2011 10:53:46  Job:  [id number]      Document Information

## 2018-11-13 ENCOUNTER — Other Ambulatory Visit (HOSPITAL_COMMUNITY): Payer: Self-pay | Admitting: Cardiology

## 2018-11-13 ENCOUNTER — Telehealth: Payer: Self-pay | Admitting: *Deleted

## 2018-11-13 ENCOUNTER — Telehealth: Payer: Self-pay

## 2018-11-13 DIAGNOSIS — I4891 Unspecified atrial fibrillation: Secondary | ICD-10-CM

## 2018-11-13 DIAGNOSIS — I10 Essential (primary) hypertension: Secondary | ICD-10-CM

## 2018-11-13 MED ORDER — APIXABAN 5 MG PO TABS: 5.0000 mg | ORAL_TABLET | Freq: Two times a day (BID) | ORAL | 3 refills | Status: DC

## 2018-11-13 NOTE — Telephone Encounter (Signed)
This encounter was created in error - please disregard.

## 2018-11-13 NOTE — Telephone Encounter (Signed)
1.  Please schedule f/u appt 2.  Then please refill x 1, pending that appt.  

## 2018-11-13 NOTE — Telephone Encounter (Signed)
Per Dr. Loanne Drilling, unable to complete PA without an appt. Routing this message to the front desk for scheduling purposes.

## 2018-11-13 NOTE — Telephone Encounter (Signed)
Received notification for Cover My Meds to complete PA for Ozempic. LOV 08/21/18. Per your office note, pt was advised to f/u 6 weeks. No future appt noted. Message being routed to Dr. Loanne Drilling.

## 2018-11-13 NOTE — Telephone Encounter (Signed)
  Sueanne Margarita, MD  Freada Bergeron, CMA        Corey Palmer  Please call patient and find out if he has had any more recent sleep study. If no then he needs split night sleep study   Traci

## 2018-11-13 NOTE — Telephone Encounter (Signed)
Split night study placed to sleep pool.

## 2018-11-16 NOTE — Telephone Encounter (Signed)
Patient is scheduled for an appointment on 11/23/18 at 1:00 p.m.

## 2018-11-16 NOTE — Telephone Encounter (Signed)
PA request placed on Dr. Cordelia Pen desk. Will await his decision re: whether to proceed with PA OR if he wants to see pt BEFORE completing.

## 2018-11-17 ENCOUNTER — Telehealth: Payer: Self-pay | Admitting: *Deleted

## 2018-11-17 NOTE — Telephone Encounter (Signed)
Staff message sent to Corey Palmer per patient's insurance [employer] no PA is required for in lab sleep study. Call reference # [id number].

## 2018-11-17 NOTE — Telephone Encounter (Signed)
Faxed sleep PA request to [place].

## 2018-11-19 ENCOUNTER — Other Ambulatory Visit: Payer: Self-pay

## 2018-11-23 ENCOUNTER — Ambulatory Visit: Payer: BC Managed Care – PPO | Admitting: Endocrinology

## 2018-11-23 ENCOUNTER — Other Ambulatory Visit (HOSPITAL_COMMUNITY): Payer: BC Managed Care – PPO

## 2018-11-23 ENCOUNTER — Other Ambulatory Visit: Payer: Self-pay

## 2018-11-23 DIAGNOSIS — Z20822 Contact with and (suspected) exposure to covid-19: Secondary | ICD-10-CM

## 2018-11-25 ENCOUNTER — Telehealth: Payer: Self-pay | Admitting: *Deleted

## 2018-11-25 LAB — NOVEL CORONAVIRUS, NAA: SARS-CoV-2, NAA: NOT DETECTED

## 2018-11-25 NOTE — Telephone Encounter (Signed)
-----   Message from Lauralee Evener, [place] sent at 11/17/2018  4:18 PM EDT ----- Regarding: RE: precert Per patient's insurance [employer] no PA is required. Ok to schedule sleep study. Call reference # [id number]. ----- Message ----- From: Freada Bergeron, CMA Sent: 11/13/2018   3:36 PM EDT To: Cv Div Sleep Studies Subject: precert                                        split night sleep study

## 2018-11-25 NOTE — Telephone Encounter (Signed)
Patient is scheduled for lab study on 12/06/18. [place] is scheduled for COVID screening on 11/6 1:15 prior to his sleep study. Patient understands his sleep study will be done at [facility] sleep lab. Patient understands he will receive a sleep packet in a week or so. Patient understands to call if he does not receive the sleep packet in a timely manner. Patient agrees with treatment and thanked me for call.

## 2018-11-30 ENCOUNTER — Other Ambulatory Visit (HOSPITAL_COMMUNITY)
Admission: RE | Admit: 2018-11-30 | Discharge: 2018-11-30 | Disposition: A | Discharge status: Home / Self Care | Payer: BC Managed Care – PPO | Source: Ambulatory Visit | Attending: Cardiology | Admitting: Cardiology

## 2018-11-30 DIAGNOSIS — Z20828 Contact with and (suspected) exposure to other viral communicable diseases: Secondary | ICD-10-CM | POA: Insufficient documentation

## 2018-11-30 DIAGNOSIS — Z01812 Encounter for preprocedural laboratory examination: Secondary | ICD-10-CM | POA: Insufficient documentation

## 2018-12-01 LAB — NOVEL CORONAVIRUS, NAA (HOSP ORDER, SEND-OUT TO REF LAB; TAT 18-24 HRS): SARS-CoV-2, NAA: NOT DETECTED

## 2018-12-01 NOTE — Telephone Encounter (Signed)
Per Dr. Loanne Drilling, unable to complete PA without an appt. Pt has canceled appts several times. Will not be able to complete PA at this time.

## 2018-12-01 NOTE — Telephone Encounter (Signed)
[employer] calling to get an update on a PA - this is for the Ozempic  Call back number is [phone] key code [phone]

## 2018-12-03 ENCOUNTER — Other Ambulatory Visit: Payer: Self-pay

## 2018-12-03 ENCOUNTER — Ambulatory Visit (HOSPITAL_COMMUNITY): Payer: BC Managed Care – PPO

## 2018-12-03 ENCOUNTER — Ambulatory Visit (HOSPITAL_COMMUNITY)
Admission: RE | Admit: 2018-12-03 | Discharge: 2018-12-03 | Disposition: A | Discharge status: Home / Self Care | Payer: BC Managed Care – PPO | Source: Ambulatory Visit | Attending: Internal Medicine | Admitting: Internal Medicine

## 2018-12-03 DIAGNOSIS — I5022 Chronic systolic (congestive) heart failure: Secondary | ICD-10-CM | POA: Diagnosis not present

## 2018-12-03 LAB — BASIC METABOLIC PANEL
Anion gap: 11 (ref 5–15)
BUN: 22 mg/dL — ABNORMAL HIGH (ref 6–20)
CO2: 25 mmol/L (ref 22–32)
Calcium: 8.8 mg/dL — ABNORMAL LOW (ref 8.9–10.3)
Chloride: 94 mmol/L — ABNORMAL LOW (ref 98–111)
Creatinine, Ser: 1.71 mg/dL — ABNORMAL HIGH (ref 0.61–1.24)
GFR calc Af Amer: 53 mL/min — ABNORMAL LOW (ref >60)
GFR calc non Af Amer: 46 mL/min — ABNORMAL LOW (ref >60)
Glucose, Bld: 420 mg/dL — ABNORMAL HIGH (ref 70–99)
Potassium: 4.1 mmol/L (ref 3.5–5.1)
Sodium: 130 mmol/L — ABNORMAL LOW (ref 135–145)

## 2018-12-04 ENCOUNTER — Other Ambulatory Visit (HOSPITAL_COMMUNITY): Payer: BC Managed Care – PPO

## 2018-12-04 ENCOUNTER — Encounter (HOSPITAL_COMMUNITY): Payer: Self-pay

## 2018-12-06 ENCOUNTER — Ambulatory Visit (HOSPITAL_BASED_OUTPATIENT_CLINIC_OR_DEPARTMENT_OTHER): Payer: BC Managed Care – PPO | Attending: Cardiology | Admitting: Cardiology

## 2018-12-06 ENCOUNTER — Other Ambulatory Visit: Payer: Self-pay

## 2018-12-06 VITALS — Ht 73.0 in | Wt 247.0 lb

## 2018-12-06 DIAGNOSIS — I10 Essential (primary) hypertension: Secondary | ICD-10-CM | POA: Insufficient documentation

## 2018-12-06 DIAGNOSIS — I4891 Unspecified atrial fibrillation: Secondary | ICD-10-CM | POA: Insufficient documentation

## 2018-12-06 DIAGNOSIS — G4733 Obstructive sleep apnea (adult) (pediatric): Secondary | ICD-10-CM

## 2018-12-18 ENCOUNTER — Ambulatory Visit (INDEPENDENT_AMBULATORY_CARE_PROVIDER_SITE_OTHER): Payer: BC Managed Care – PPO | Admitting: *Deleted

## 2018-12-18 ENCOUNTER — Other Ambulatory Visit: Payer: Self-pay

## 2018-12-18 ENCOUNTER — Ambulatory Visit (HOSPITAL_COMMUNITY)
Admission: RE | Admit: 2018-12-18 | Discharge: 2018-12-18 | Disposition: A | Discharge status: Home / Self Care | Payer: BC Managed Care – PPO | Source: Ambulatory Visit | Attending: Cardiology | Admitting: Cardiology

## 2018-12-18 DIAGNOSIS — I5022 Chronic systolic (congestive) heart failure: Secondary | ICD-10-CM

## 2018-12-18 DIAGNOSIS — I428 Other cardiomyopathies: Secondary | ICD-10-CM

## 2018-12-18 MED ORDER — DAPAGLIFLOZIN PROPANEDIOL 10 MG PO TABS: 10.0000 mg | ORAL_TABLET | Freq: Every day | ORAL | 6 refills | Status: DC

## 2018-12-18 NOTE — Patient Instructions (Signed)
START Farxiga 10mg  (1 tab) daily  Labs in 2 weeks We will only contact you if something comes back abnormal or we need to make some changes. Otherwise no news is good news!  Your physician recommends that you schedule a follow-up appointment in: 2 months with Dr Aundra Dubin (in person).  Please call office at [phone] option 2 if you have any questions or concerns.    At the [facility], you and your health needs are our priority. As part of our continuing mission to provide you with exceptional heart care, we have created designated Provider Care Teams. These Care Teams include your primary Cardiologist (physician) and Advanced Practice Providers (APPs- Physician Assistants and Nurse Practitioners) who all work together to provide you with the care you need, when you need it.   You may see any of the following providers on your designated Care Team at your next follow up: Marland Kitchen Dr Glori Bickers . Dr Loralie Champagne . Darrick Grinder, NP . Lyda Jester, PA   Please be sure to bring in all your medications bottles to every appointment.

## 2018-12-18 NOTE — Progress Notes (Addendum)
Spoke with patient, avs reviewed.  He will come in on Monday to pick up co pay card for farxiga and avs.  Transferred to scheduler to have labs done in 2 weeks and appt with Dr Aundra Dubin in 2 months. Verbalized understanding. Pt will pick up co pay card, avs and appt cards on monday

## 2018-12-20 LAB — CUP PACEART REMOTE DEVICE CHECK
Date Time Interrogation Session: 20201121090828
Implantable Lead Implant Date: 20090817
Implantable Lead Location: 753860
Implantable Lead Model: 6935
Implantable Pulse Generator Implant Date: 20180419
Pulse Gen Serial Number: 7411315

## 2018-12-20 NOTE — Progress Notes (Signed)
Heart Failure TeleHealth Note  Due to national recommendations of social distancing due to Wanda 19, Audio/video telehealth visit is felt to be most appropriate for this patient at this time.  See MyChart message from today for patient consent regarding telehealth for Corey Palmer.  Date:  12/20/2018   ID:  Corey Palmer, DOB Nov 08, 1970, MRN [id number]  Location: Home  Provider location: [facility] Type of Visit: Established patient   PCP:  Corey Pepper, MD  HF Cardiology: Dr. Aundra Dubin  Chief Complaint: Fatigue   History of Present Illness: Corey Palmer is a 48 y.o. male who presents via audio/video conferencing for a telehealth visit today.     he denies symptoms worrisome for COVID 88.   48 y.o. with history of nonischemic cardiomyopathy and paroxysmal atrial fibrillation was referred by Dr. Percival Spanish for evaluation of CHF.  He has had a nonischemic cardiomyopathy known since the early 2000s.  His father and mother both had CHF and died in their 34s. He does not drink ETOH or smoke.  He has a Research officer, political party ICD.  LHC in 2002 showed 40% LAD stenosis (nonobstructive).  Last echo in 8/20 from the hospital in [place] showed EF 15% with mildly decreased RV systolic function.  This is lower than in the past, where EF has been in the 25% range.  He has had paroxysmal atrial fibrillation and is on Eliquis.  He has type 2 diabetes and is now on insulin.  He has OSA but does not have CPAP. He was off his cardiac meds for about 2 years but restarted earlier this year.  He was admitted in 8/20 in [place] and again in 9/20 at [facility] with CHF.    CPX in 11/20 showed moderate HF limitation.   He continues to do well symptomatically.  He works full time.  No significant exertional dyspnea.  No orthopnea/PND.  No chest pain.  He is tolerating his meds without problems and is taking everything. No lightheadedness.   Labs (6/13): K 4.4, creatinine 1.0, BNP 2793, digoxin 0.6 Labs  (7/13): K 3.3, creatinine 0.9, digoxin 0.5 Labs (8/13): BNP 121 Labs (9/13): K 3.7, creatinine 0.89 Labs (10/13): K 4.1, creatinine 1.0, BNP 164, TGs 537, HDL 27, LDL 116 Labs (9/20): K 3.8, creatinine 1.06 Labs (11/20): K 4.1, creatinine 1.7  PMH:  1. Type II diabetes with peripheral neuropathy.  2. Hyperlipidemia 3. Atrial fibrillation: Paroxysmal. He was on dofetilide in the past but was not compliant with it.  He was taken off coumadin with upper GI bleed in 3/12 but is now back on apixaban.  4. Upper GI bleed: From antral ulcers, was in 3/12.  5. Gout 6. Dilated cardiomyopathy: Nonischemic.  St Jude ICD in 8921 (complicated by RA perforation and tamponade).  LHC in 2002 with 40% LAD stenosis. Echo (6/13) with EF 20%, akinetic anteroseptum, moderate MR, severe TR, moderately dilated RV.  - Echo 11/10: EF 25-30%.  - Echo 8/12: EF 20-25% - Echo 4/15: EF 20-25% - Echo 8/20: EF 15%, diffuse hypokinesis, mildly decreased RV systolic function, mild-moderate MR.  - CPX (11/20): peak VO2 17.1, VE/VCO2 slope 35, RER 1.10, moderate HF limitation, obesity contributes. - [facility] gene testing was negative for common dilated cardiomyopathy mutations.  7. CAD: LHC 2002 with 40% LAD.  8. OSA  SH: Married with 3 children, 1 son served in [place].  No ETOH, no smoking.  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     FH:  Mother and father both had CHF, uncertain etiology, and died in their 39s.    ROS: All systems reviewed and negative except as per HPI.   Current Outpatient Medications  Medication Sig Dispense Refill  . apixaban (ELIQUIS) 5 MG TABS tablet Take 1 tablet (5 mg total) by mouth 2 (two) times daily. 180 tablet 3  . atorvastatin (LIPITOR) 80 MG tablet Take 1 tablet (80 mg total) by mouth daily. (Patient taking differently: Take 80 mg by mouth at bedtime. ) 90 tablet 3  . carvedilol (COREG) 25 MG tablet Take 1 tablet (25 mg total) by mouth 2 (two) times daily. 180 tablet 3  .  dapagliflozin propanediol (FARXIGA) 10 MG TABS tablet Take 10 mg by mouth daily before breakfast. 30 tablet 6  . digoxin (LANOXIN) 0.125 MG tablet Take 1 tablet (0.125 mg total) by mouth daily. 90 tablet 3  . divalproex (DEPAKOTE ER) 500 MG 24 hr tablet Take 1 tablet (500 mg total) by mouth daily. 90 tablet 0  . Dulaglutide (TRULICITY) 1.5 LE/7.5TZ SOPN Inject 1.5 mg into the skin once a week. (Patient taking differently: Inject 1.5 mg into the skin every Sunday. ) 12 pen 3  . fenofibrate 160 MG tablet Take 1 tablet (160 mg total) by mouth at bedtime. 90 tablet 0  . FLUoxetine (PROZAC) 20 MG capsule Take 4 capsules (80 mg total) by mouth daily. 120 capsule 2  . furosemide (LASIX) 20 MG tablet Take 40mg (2 tabs) by mouth every morning and 20mg (1 tab) every evening 360 tablet 3  . glucose blood (FREESTYLE PRECISION NEO TEST) test strip 1 strip and 1 lancet, each 10 per day.  Uses Omnipod device. 900 each 3  . Insulin Glargine (LANTUS SOLOSTAR) 100 UNIT/ML Solostar Pen Inject 50 Units into the skin every morning. 10 pen PRN  . insulin lispro (HUMALOG) 100 UNIT/ML injection Inject 5-10 Units into the skin See admin instructions. Inject depending upon blood sugar reading: base unit is 5 increase by 1 units per 50 blood sugar reading starting at 150.    . magnesium oxide (MAG-OX) 400 MG tablet Take 400 mg by mouth daily.    Marland Kitchen omeprazole (PRILOSEC) 40 MG capsule Take 1 capsule (40 mg total) by mouth daily. 30 capsule 3  . potassium chloride SA (K-DUR,KLOR-CON) 20 MEQ tablet Take 1 tablet (20 mEq total) by mouth daily. (Patient taking differently: Take 20 mEq by mouth at bedtime. ) 90 tablet 3  . sacubitril-valsartan (ENTRESTO) 24-26 MG Take 1 tablet by mouth 2 (two) times daily. 60 tablet 5  . spironolactone (ALDACTONE) 25 MG tablet Take 1 tablet (25 mg total) by mouth daily. 30 tablet 5   No current facility-administered medications for this encounter.    Exam:  (Video/Tele Health Call; Exam is subjective  and or/visual.) General:  Speaks in full sentences. No resp difficulty. Lungs: Normal respiratory effort with conversation.  Abdomen: Non-distended per patient report Extremities: Pt denies edema. Neuro: Alert & oriented x 3.   Assessment/Plan: 1. Atrial fibrillation: Patient has remained in NSR. He was on dofetilide in the remote past but this was stopped due to noncompliance.  He had an upper GI bleed from antral ulcers in 3/12. He was seen by GI and was deemed safe to restart anticoagulation as long as he remains on a PPI.  - Continue Protonix.  - Continue apixaban 5 mg bid.   2. CAD: Nonobstructive. Continue atorvastatin.  3. Chronic systolic CHF: Nonischemic cardiomyopathy.  Possible familial cardiomyopathy as both parents  had cardiomyopathy and died at around 3. However, [facility] gene testing did not show any common mutation for cardiomyopathy. St Jude ICD.  Echo in 8/20 with EF 15% and mildly decreased RV function.  Despite markedly low EF, he reports minimal symptoms, NYHA class I-II.  CPX in 11/20 with moderate HF limitation though obesity likely plays a role.  - Continue digoxin, check level with labs in 2 wks.   - Continue Coreg 25 mg bid.  - Continue Entresto 24/26 bid.  Most recent creatinine was elevated, I will not increase Entresto today.  - Continue spironolactone 25 mg daily.  - Continue Lasix 40 qam/20 qpm.    - Start Farxiga 10 mg daily.  BMET in 2 wks.  4. OSA: OSA on sleep study, still waiting for CPAP.    COVID screen The patient does not have any symptoms that suggest any further testing/ screening at this time.  Social distancing reinforced today.  Patient Risk: After full review of this patients clinical status, I feel that they are at moderate risk for cardiac decompensation at this time.  Relevant cardiac medications were reviewed at length with the patient today. The patient does not have concerns regarding their medications at this time.   Recommended  follow-up:  2 months.   Today, I have spent 18 minutes with the patient with telehealth technology discussing the above issues .    Signed, Loralie Champagne, MD  12/20/2018  [facility] [address] [facility] [place] [address] [phone] (office) [phone] (fax)

## 2018-12-21 NOTE — Procedures (Signed)
° °  Patient Name: Corey Palmer, Corey Palmer Date: 12/06/2018 Gender: Male D.O.B: 1970/06/09 Age (years): 58 Referring Provider: Fransico Him MD, ABSM Height (inches): 73 Interpreting Physician: Fransico Him MD, ABSM Weight (lbs): 247 RPSGT: Gwenyth Allegra BMI: 33 MRN: [id number] Neck Size: 20.00  CLINICAL INFORMATION The patient is referred for a split night study with BPAP.  MEDICATIONS Medications self-administered by patient taken the night of the study : N/A  SLEEP STUDY TECHNIQUE As per the AASM Manual for the Scoring of Sleep and Associated Events v2.3 (April 2016) with a hypopnea requiring 4% desaturations.  The channels recorded and monitored were frontal, central and occipital EEG, electrooculogram (EOG), submentalis EMG (chin), nasal and oral airflow, thoracic and abdominal wall motion, anterior tibialis EMG, snore microphone, electrocardiogram, and pulse oximetry. Bi-level positive airway pressure (BiPAP) was initiated when the patient met split night criteria and was titrated according to treat sleep-disordered breathing.  RESPIRATORY PARAMETERS Diagnostic Total AHI (/hr): 26.5  RDI (/hr):31.5  CA Index (/hr): 0.5 REM AHI (/hr): 36.5  NREM AHI (/hr):24.1  Supine AHI (/hr):N/A  Non-supine AHI (/hr): 31.50 Min O2 Sat (%):85.0  Mean O2 (%): 92.7  Time below 88% (min):2.5   Titration Optimal IPAP Pressure (cm): 19  Optimal EPAP Pressure (cm):15  AHI at Optimal Pressure (/hr):5.2  Min O2 at Optimal Pressure (%):92.0 Sleep % at Optimal (%):89  Supine % at Optimal (%):68   SLEEP ARCHITECTURE The study was initiated at 9:39:44 PM and terminated at 4:50:31 AM. The total recorded time was 430.8 minutes. EEG confirmed total sleep time was 358.5 minutes yielding a sleep efficiency of 83.2%. Sleep onset after lights out was 7.3 minutes with a REM latency of 74.0 minutes. The patient spent 12.7% of the night in stage N1 sleep, 65.6% in stage N2 sleep, 0.0% in stage N3 and 21.8%  in REM. Wake after sleep onset (WASO) was 65.0 minutes. The Arousal Index was 16.1/hour.  LEG MOVEMENT DATA The total Periodic Limb Movements of Sleep (PLMS) were 0. The PLMS index was 0.0 .  CARDIAC DATA The 2 lead EKG demonstrated NSR. The mean heart rate was 100.0 beats per minute. Other EKG findings include: None.  IMPRESSIONS - Moderate obstructive sleep apnea occurred during the diagnostic portion of the study (AHI = 26.5 /hour). An optimal PAP pressure was selected for this patient ( 19/15cm of water) - No significant central sleep apnea occurred during the diagnostic portion of the study (CAI = 0.5/hour). - Mild oxygen desaturation was noted during the diagnostic portion of the study (Min O2 = 85.0%). - The patient snored with soft snoring volume during the diagnostic portion of the study. - No cardiac abnormalities were noted during this study. - Clinically significant periodic limb movements of sleep did not occur during the study.  DIAGNOSIS - Obstructive Sleep Apnea (327.23 [G47.33 ICD-10])  RECOMMENDATIONS - Trial of BiPAP therapy on 19/15 cm H2O with a Medium size Fisher&Paykel Full Face Mask Simplus mask and heated humidification. - Avoid alcohol, sedatives and other CNS depressants that may worsen sleep apnea and disrupt normal sleep architecture. - Sleep hygiene should be reviewed to assess factors that may improve sleep quality. - Weight management and regular exercise should be initiated or continued. - Return to Sleep Center for re-evaluation in 10 weeks.  [Electronically signed] 12/21/2018 02:44 PM  Fransico Him MD, ABSM Diplomate, American Board of Sleep Medicine

## 2018-12-22 ENCOUNTER — Telehealth: Payer: Self-pay | Admitting: *Deleted

## 2018-12-22 ENCOUNTER — Other Ambulatory Visit (HOSPITAL_COMMUNITY): Payer: Self-pay | Admitting: Cardiology

## 2018-12-22 NOTE — Telephone Encounter (Signed)
-----   Message from Sueanne Margarita, MD sent at 12/21/2018  2:47 PM EST ----- Please let patient know that they have significant sleep apnea and had successful PAP titration and will be set up with PAP unit.  Please let DME know that order is in EPIC.  Please set patient up for OV in 10 weeks

## 2018-12-22 NOTE — Telephone Encounter (Signed)
Informed patient of sleep study results and patient understanding was verbalized. Patient understands his sleep study showed that they have significant sleep apnea and had successful PAP titration and will be set up with PAP unit. Please let DME know that order is in EPIC.  Upon patient request DME selection is [facility]. Patient understands he will be contacted by [facility] to set up his cpap. Patient understands to call if [facility] does not contact him with new setup in a timely manner. Patient understands they will be called once confirmation has been received from [facility] that they have received their new machine to schedule 10 week follow up appointment.  [facility] notified of new cpap order  Please add to airview Patient was grateful for the call and thanked me.

## 2018-12-27 ENCOUNTER — Other Ambulatory Visit: Payer: Self-pay | Admitting: Internal Medicine

## 2018-12-28 ENCOUNTER — Ambulatory Visit (HOSPITAL_COMMUNITY)
Admission: RE | Admit: 2018-12-28 | Discharge: 2018-12-28 | Disposition: A | Discharge status: Home / Self Care | Payer: BC Managed Care – PPO | Source: Ambulatory Visit | Attending: Cardiology | Admitting: Cardiology

## 2018-12-28 ENCOUNTER — Other Ambulatory Visit: Payer: Self-pay

## 2018-12-28 DIAGNOSIS — I5022 Chronic systolic (congestive) heart failure: Secondary | ICD-10-CM | POA: Diagnosis not present

## 2018-12-28 LAB — DIGOXIN LEVEL: Digoxin Level: <0.2 ng/mL — ABNORMAL LOW (ref 0.8–2.0)

## 2018-12-28 LAB — BASIC METABOLIC PANEL
Anion gap: 11 (ref 5–15)
BUN: 17 mg/dL (ref 6–20)
CO2: 21 mmol/L — ABNORMAL LOW (ref 22–32)
Calcium: 8.5 mg/dL — ABNORMAL LOW (ref 8.9–10.3)
Chloride: 102 mmol/L (ref 98–111)
Creatinine, Ser: 1.07 mg/dL (ref 0.61–1.24)
GFR calc Af Amer: >60 mL/min (ref >60)
GFR calc non Af Amer: >60 mL/min (ref >60)
Glucose, Bld: 416 mg/dL — ABNORMAL HIGH (ref 70–99)
Potassium: 4.1 mmol/L (ref 3.5–5.1)
Sodium: 134 mmol/L — ABNORMAL LOW (ref 135–145)

## 2018-12-29 ENCOUNTER — Telehealth (HOSPITAL_COMMUNITY): Payer: Self-pay

## 2018-12-29 NOTE — Telephone Encounter (Signed)
Pt aware of lab work.  Pt reports he is in communication with his endocrinologist and are making medication adjustments with the addition of farxiga.  He is taking dig every day as ordered.

## 2018-12-29 NOTE — Telephone Encounter (Signed)
-----   Message from Larey Dresser, MD sent at 12/28/2018  4:10 PM EST ----- Labs ok except glucose still high

## 2019-01-07 DIAGNOSIS — G4733 Obstructive sleep apnea (adult) (pediatric): Secondary | ICD-10-CM | POA: Diagnosis not present

## 2019-01-12 NOTE — Progress Notes (Signed)
Remote ICD transmission.   

## 2019-01-13 ENCOUNTER — Telehealth: Payer: Self-pay | Admitting: *Deleted

## 2019-01-13 NOTE — Telephone Encounter (Signed)

## 2019-01-13 NOTE — Telephone Encounter (Signed)
Patient has a 10 week follow up appointment scheduled for 03/11/19. Patient understands he needs to keep this appointment for insurance compliance. Patient was grateful for the call and thanked me.

## 2019-01-19 ENCOUNTER — Telehealth: Payer: Self-pay

## 2019-01-19 NOTE — Telephone Encounter (Signed)
1. F/u ov is due. 2.  Is there a covered alternative that does not need PA?

## 2019-01-19 NOTE — Telephone Encounter (Signed)
Attempted PA for Trulicity on Cover My Meds. Unable to locate pt. Pt will need to provide updated insurance information in order to complete. In addition, appears pt is overdue for an appt. Routing to Dr. Loanne Drilling to make him aware.

## 2019-01-19 NOTE — Telephone Encounter (Signed)
Called pt and scheduled F/U appt for 01/03/20. Pt will need to provide updated insurance info in order to complete PA.

## 2019-01-20 ENCOUNTER — Telehealth (HOSPITAL_COMMUNITY): Payer: Self-pay

## 2019-01-20 ENCOUNTER — Other Ambulatory Visit (HOSPITAL_COMMUNITY): Payer: Self-pay | Admitting: Psychiatry

## 2019-01-20 MED ORDER — DIVALPROEX SODIUM ER 500 MG PO TB24: 500.0000 mg | ORAL_TABLET | Freq: Every day | ORAL | 0 refills | Status: DC

## 2019-01-20 NOTE — Telephone Encounter (Signed)
Medication refill request - Fax received from pt's [facility] requesting a refill of hi Divalproex XR medication.  Last filled for #90 on 10/22/18. Informed pharmacy pt would need to call to reschedule missed appt and would send refill request to MD.

## 2019-01-20 NOTE — Telephone Encounter (Signed)
I took care of refill.

## 2019-02-02 ENCOUNTER — Other Ambulatory Visit: Payer: Self-pay

## 2019-02-03 ENCOUNTER — Encounter: Payer: Self-pay | Admitting: Endocrinology

## 2019-02-03 ENCOUNTER — Ambulatory Visit (INDEPENDENT_AMBULATORY_CARE_PROVIDER_SITE_OTHER): Payer: BC Managed Care – PPO | Admitting: Endocrinology

## 2019-02-03 ENCOUNTER — Other Ambulatory Visit: Payer: Self-pay

## 2019-02-03 ENCOUNTER — Telehealth: Payer: Self-pay

## 2019-02-03 VITALS — BP 110/80 | HR 69 | Ht 73.0 in | Wt 261.0 lb

## 2019-02-03 DIAGNOSIS — E1165 Type 2 diabetes mellitus with hyperglycemia: Secondary | ICD-10-CM | POA: Diagnosis not present

## 2019-02-03 LAB — POCT GLYCOSYLATED HEMOGLOBIN (HGB A1C): Hemoglobin A1C: 12.5 % — AB (ref 4.0–5.6)

## 2019-02-03 MED ORDER — LANTUS SOLOSTAR 100 UNIT/ML ~~LOC~~ SOPN: 60.0000 [IU] | PEN_INJECTOR | SUBCUTANEOUS | 99 refills | Status: DC

## 2019-02-03 MED ORDER — TRULICITY 1.5 MG/0.5ML ~~LOC~~ SOAJ: 1.5000 mg | SUBCUTANEOUS | 11 refills | Status: DC

## 2019-02-03 MED ORDER — CONTOUR TEST VI STRP: 1.0000 | ORAL_STRIP | Freq: Two times a day (BID) | 3 refills | Status: AC

## 2019-02-03 NOTE — Progress Notes (Signed)
Subjective:    Patient ID: Corey Palmer, male    DOB: 11-Jan-1971, 49 y.o.   MRN: [id number]  HPI Pt returns for f/u of diabetes mellitus:  DM type: Insulin-requiring type 2.  Dx'ed: 3235 Complications: polyneuropathy, CAD, and renal insuff.   Therapy: insulin since 2011, Farxiga, and trulicity.   DKA: never Severe hypoglycemia: once (2015) Pancreatitis: never Pancreatic imaging: normal on 2013 CT Other: ins declines farxiga; he declines continuous glucose monitor, due to cost; he works as heavy Office manager; he stopped Omnipod, due to cost; he takes QD insulin, after poor results with multiple daily injections.   Interval history: no cbg record, but he says cbg varies from 62-250.  He stopped Trulicity, due to cost ($75 x 3 mos).  He seldom has hypoglycemia, and these episodes are mild.  This happens in the middle of the night.  He averages 10 units per day of PRN Novolog.  Past Medical History:  Diagnosis Date  . Anginal pain ([place])   . Anxiety   . Arthritis   . Automatic implantable cardioverter-defibrillator in situ 2007   Medtronic   . Bipolar disorder ([place])   . CAD (coronary artery disease)    Cardiac cath (08/19/2000) - Nonobstructive, 40% stenosis of LAD,.   . Cancer [facility]) 2013   benign stomach cancer  . CHF (congestive heart failure) ([place])   . Chondrocalcinosis of right knee 06/11/2013  . Chronic systolic heart failure ([place])    2D echo (57/3220) - Systolic function severely reduced., LV EF 20-25%. Akinesis of apical and anteroseptal mycoardium.  last 2D echo (11/2008 ), LV EF 25-30%, diffuse hypokinesis, and grade 1 diastolic dysfunction.  . Depression   . Diabetes uncomplicated adult-type II    on insulin therapy, a1c 12.9 in 01/2011  . Dilated cardiomyopathy ([place])    status post ICD placement in 2008 c/b tearing at the atrial junction resulting in tamponade and urgent thoracotomy  . Dyslipidemia   . Dysrhythmia   . GERD (gastroesophageal reflux disease)   . Gout,  unspecified   . Hepatic steatosis 2011   seen on ultrasound.   . History of kidney stones   . Hx of echocardiogram    Echo (04/2013):  Mild LVH, EF 20-25%, mild LAE, mild to mod RVE with mild reduced RVSF.  Marland Kitchen Hypertension   . Obesity (BMI 30.0-34.9)   . Pacemaker   . Paroxysmal atrial fibrillation (H[facility])    on chronic coumadin, goal INR 2-3. status post direct current  . Peptic ulcer   . Shortness of breath   . Sleep apnea    does not have CPAP    Past Surgical History:  Procedure Laterality Date  . CARDIAC CATHETERIZATION    . CARDIAC PACEMAKER PLACEMENT    . CHOLECYSTECTOMY    . COLONOSCOPY    . CORONARY ARTERY BYPASS GRAFT    . ESOPHAGOGASTRODUODENOSCOPY  04/05/2011   Procedure: ESOPHAGOGASTRODUODENOSCOPY (EGD);  Surgeon: Irene Shipper, MD;  Location: [facility] ENDOSCOPY;  Service: Endoscopy;  Laterality: N/A;  . JOINT REPLACEMENT     left knee replacement  . KNEE ARTHROSCOPY Right 06/11/2013   Procedure: RIGHT KNEE ARTHROSCOPY with chondroplasty;  Surgeon: Johnny Bridge, MD;  Location: [facility];  Service: Orthopedics;  Laterality: Right;  . TOTAL KNEE ARTHROPLASTY Left     Social History   Socioeconomic History  . Marital status: Married    Spouse name: Not on file  . Number of children: 3  . Years of education: college  .  Highest education level: Not on file  Occupational History  . Occupation: bar tender  Tobacco Use  . Smoking status: Never Smoker  . Smokeless tobacco: Never Used  Substance and Sexual Activity  . Alcohol use: No  . Drug use: No  . Sexual activity: Yes  Other Topics Concern  . Not on file  Social History Narrative   Lives in [place].   Studied sports medicine in college, got a BS - used to work with the [employer].         Social Determinants of Health   Financial Resource Strain:   . Difficulty of Paying Living Expenses: Not on file  Food Insecurity:   . Worried About Charity fundraiser in the Last Year: Not on file  . Ran Out of Food in the  Last Year: Not on file  Transportation Needs:   . Lack of Transportation (Medical): Not on file  . Lack of Transportation (Non-Medical): Not on file  Physical Activity:   . Days of Exercise per Week: Not on file  . Minutes of Exercise per Session: Not on file  Stress:   . Feeling of Stress : Not on file  Social Connections:   . Frequency of Communication with Friends and Family: Not on file  . Frequency of Social Gatherings with Friends and Family: Not on file  . Attends Religious Services: Not on file  . Active Member of Clubs or Organizations: Not on file  . Attends Archivist Meetings: Not on file  . Marital Status: Not on file  Intimate Partner Violence:   . Fear of Current or Ex-Partner: Not on file  . Emotionally Abused: Not on file  . Physically Abused: Not on file  . Sexually Abused: Not on file    Current Outpatient Medications on File Prior to Visit  Medication Sig Dispense Refill  . apixaban (ELIQUIS) 5 MG TABS tablet Take 1 tablet (5 mg total) by mouth 2 (two) times daily. 180 tablet 3  . atorvastatin (LIPITOR) 80 MG tablet Take 1 tablet (80 mg total) by mouth daily. (Patient taking differently: Take 80 mg by mouth at bedtime. ) 90 tablet 3  . carvedilol (COREG) 25 MG tablet Take 1 tablet (25 mg total) by mouth 2 (two) times daily. 180 tablet 3  . dapagliflozin propanediol (FARXIGA) 10 MG TABS tablet Take 10 mg by mouth daily before breakfast. 30 tablet 6  . divalproex (DEPAKOTE ER) 500 MG 24 hr tablet Take 1 tablet (500 mg total) by mouth daily. 90 tablet 0  . furosemide (LASIX) 20 MG tablet Take 40mg (2 tabs) by mouth every morning and 20mg (1 tab) every evening 360 tablet 3  . magnesium oxide (MAG-OX) 400 MG tablet Take 400 mg by mouth daily.    Marland Kitchen omeprazole (PRILOSEC) 40 MG capsule Take 1 capsule (40 mg total) by mouth daily. 30 capsule 3  . potassium chloride SA (K-DUR,KLOR-CON) 20 MEQ tablet Take 1 tablet (20 mEq total) by mouth daily. (Patient taking  differently: Take 20 mEq by mouth at bedtime. ) 90 tablet 3  . sacubitril-valsartan (ENTRESTO) 24-26 MG Take 1 tablet by mouth 2 (two) times daily. 60 tablet 5  . spironolactone (ALDACTONE) 25 MG tablet Take 1 tablet (25 mg total) by mouth daily. 30 tablet 5  . digoxin (LANOXIN) 0.125 MG tablet Take 1 tablet (0.125 mg total) by mouth daily. 90 tablet 3  . fenofibrate 160 MG tablet Take 1 tablet (160 mg total) by mouth at bedtime. [place]  tablet 0  . FLUoxetine (PROZAC) 20 MG capsule Take 4 capsules (80 mg total) by mouth daily. 120 capsule 2   No current facility-administered medications on file prior to visit.    Allergies  Allergen Reactions  . Orange Fruit Anaphylaxis    Per Pt- Blister around lips and Hives all over   . Penicillins Anaphylaxis    Did it involve swelling of the face/tongue/throat, SOB, or low BP? Yes Did it involve sudden or severe rash/hives, skin peeling, or any reaction on the inside of your mouth or nose? Yes Did you need to seek medical attention at a hospital or doctor's office? No When did it last happen?childhood If all above answers are "NO", may proceed with cephalosporin use.  [name] [Empagliflozin] Itching    Family History  Problem Relation Age of Onset  . Diabetes Mother   . Coronary artery disease Mother 78  . Gout Mother   . Heart disease Mother   . Heart attack Mother 61  . Diabetes Father   . Coronary artery disease Father 71       Died in a house fire   . Heart disease Father   . Heart disease Brother     BP 110/80 (BP Location: Left Arm, Patient Position: Sitting, Cuff Size: Large)   Pulse 69   Ht 6\' 1"  (1.854 m)   Wt 261 lb (118.4 kg)   SpO2 93%   BMI 34.43 kg/m    Review of Systems Denies LOC.      Objective:   Physical Exam VITAL SIGNS:  See vs page GENERAL: no distress Pulses: dorsalis pedis intact bilat.   MSK: no deformity of the feet CV: trace bilat leg edema Skin:  no ulcer on the feet.  normal color and temp  on the feet. Neuro: sensation is intact to touch on the feet Ext: there is bilateral onychomycosis of the toenails.   Lab Results  Component Value Date   HGBA1C 12.5 (A) 02/03/2019       Assessment & Plan:  Insulin-requiring type 2 DM, with CAD: worse Hypoglycemia, recurrent Noncompliance with cbg recording.   Patient Instructions  I have sent a prescription to your pharmacy, to increase the Lantus, and:  You can stop taking the Novolog, and: Here is a new meter.  I have sent a prescription to your pharmacy, for strips.  check your blood sugar twice a day.  vary the time of day when you check, between before the 3 meals, and at bedtime.  also check if you have symptoms of your blood sugar being too high or too low.  please keep a record of the readings and bring it to your next appointment here (or you can bring the meter itself).  You can write it on any piece of paper.  please call us sooner if your blood sugar goes below 70, or if you have a lot of readings over 200.  Please come back for a follow-up appointment in 2 months.

## 2019-02-03 NOTE — Telephone Encounter (Signed)
Routing this message to front desk for re-verification of pt insurance info.

## 2019-02-03 NOTE — Telephone Encounter (Signed)
Attempted to complete PA for Trulicity through Cover My Meds but encountered the following error:  No eligibility was found. Please reconfirm the patient's plan before submitting.  Pt will need to provide updated insurance information in order to proceed with PA.

## 2019-02-03 NOTE — Patient Instructions (Signed)
I have sent a prescription to your pharmacy, to increase the Lantus, and:  You can stop taking the Novolog, and: Here is a new meter.  I have sent a prescription to your pharmacy, for strips.  check your blood sugar twice a day.  vary the time of day when you check, between before the 3 meals, and at bedtime.  also check if you have symptoms of your blood sugar being too high or too low.  please keep a record of the readings and bring it to your next appointment here (or you can bring the meter itself).  You can write it on any piece of paper.  please call us sooner if your blood sugar goes below 70, or if you have a lot of readings over 200.  Please come back for a follow-up appointment in 2 months.

## 2019-02-04 NOTE — Telephone Encounter (Signed)
Patient has BCBS is that the only insurance you have to do the PA for I did run his insurance and yesterday [employer] was taking a while to verify but he confirmed that Corey Palmer was his insurance

## 2019-02-05 ENCOUNTER — Telehealth: Payer: Self-pay

## 2019-02-05 NOTE — Telephone Encounter (Signed)
DENIAL  Received notification from [place] that [place] for Trulicity has been denied. Following reasons provided: DOES NOT HAVE RX BENEFITS WITH BCBS. Document has been labeled and placed in scan file for HIM and for our future reference.

## 2019-02-05 NOTE — Telephone Encounter (Signed)
Pt will need to speak with his insurance company d/t error we received when processing his PA. No further action can be taken to process PA d/t their inability to locate pt within their system. This is beyond our control. Closing this encounter. Once pt has resolved this issue, another PA can be attempted.

## 2019-02-05 NOTE — Telephone Encounter (Signed)
PRIOR AUTHORIZATION  PA initiation date: 02/05/19   Insurance Company: [employer] completed electronically through [employer] My Meds: Yes  Will await insurance response re: approval/denial.  Juliette Mangle (Key: [id number])  Your information has been submitted to [place]. [employer] [place] will review the request and fax you a determination directly, typically within 3 business days of your submission once all necessary information is received.  If [employer] [place] has not responded in 3 business days or if you have any questions about your submission, contact [place] at [phone].

## 2019-02-07 DIAGNOSIS — G4733 Obstructive sleep apnea (adult) (pediatric): Secondary | ICD-10-CM | POA: Diagnosis not present

## 2019-02-16 ENCOUNTER — Telehealth (HOSPITAL_COMMUNITY): Payer: Self-pay | Admitting: Pharmacist

## 2019-02-16 NOTE — Telephone Encounter (Signed)
Advanced Heart Failure Patient Advocate Encounter  Prior Authorization for Delene Loll has been approved.    Effective dates: 02/16/19 through 02/16/20  Audry Riles, PharmD, BCPS, BCCP, CPP Heart Failure Clinic Pharmacist [phone]

## 2019-02-19 ENCOUNTER — Other Ambulatory Visit: Payer: Self-pay

## 2019-02-19 ENCOUNTER — Ambulatory Visit (HOSPITAL_COMMUNITY)
Admission: RE | Admit: 2019-02-19 | Discharge: 2019-02-19 | Disposition: A | Discharge status: Home / Self Care | Payer: BC Managed Care – PPO | Source: Ambulatory Visit | Attending: Cardiology | Admitting: Cardiology

## 2019-02-19 ENCOUNTER — Encounter (HOSPITAL_COMMUNITY): Payer: Self-pay

## 2019-02-19 DIAGNOSIS — I2510 Atherosclerotic heart disease of native coronary artery without angina pectoris: Secondary | ICD-10-CM

## 2019-02-19 DIAGNOSIS — I4891 Unspecified atrial fibrillation: Secondary | ICD-10-CM

## 2019-02-19 DIAGNOSIS — I428 Other cardiomyopathies: Secondary | ICD-10-CM | POA: Diagnosis not present

## 2019-02-19 DIAGNOSIS — I5022 Chronic systolic (congestive) heart failure: Secondary | ICD-10-CM | POA: Diagnosis not present

## 2019-02-19 DIAGNOSIS — I480 Paroxysmal atrial fibrillation: Secondary | ICD-10-CM

## 2019-02-19 MED ORDER — DIGOXIN 125 MCG PO TABS: 0.1250 mg | ORAL_TABLET | Freq: Every day | ORAL | 3 refills | Status: AC

## 2019-02-19 MED ORDER — SACUBITRIL-VALSARTAN 49-51 MG PO TABS: 1.0000 | ORAL_TABLET | Freq: Two times a day (BID) | ORAL | 6 refills | Status: AC

## 2019-02-19 NOTE — Patient Instructions (Addendum)
INCREASE Entresto 49/51mg  (1 tab) twice a day   Labs in 2 weeks on Friday, February 5th, 2021 at 3:15pm [place] code [id number] We will only contact you if something comes back abnormal or we need to make some changes. Otherwise no news is good news!   Your physician recommends that you schedule a follow-up appointment in: 6 weeks with Dr Aundra Dubin on Monday March 8th, 2021 at 11:20am. [place] code 6007.    Please call office at [phone] option 2 if you have any questions or concerns.    At the [facility], you and your health needs are our priority. As part of our continuing mission to provide you with exceptional heart care, we have created designated Provider Care Teams. These Care Teams include your primary Cardiologist (physician) and Advanced Practice Providers (APPs- Physician Assistants and Nurse Practitioners) who all work together to provide you with the care you need, when you need it.   You may see any of the following providers on your designated Care Team at your next follow up: Marland Kitchen Dr Glori Bickers . Dr Loralie Champagne . Darrick Grinder, NP . Lyda Jester, PA . Audry Riles, PharmD   Please be sure to bring in all your medications bottles to every appointment.

## 2019-02-19 NOTE — Progress Notes (Signed)
Spoke with patient, call summary reviewed with patient. Questions answered. avs sent via mychart. Appts scheduled

## 2019-02-21 NOTE — Progress Notes (Signed)
Heart Failure TeleHealth Note  Due to national recommendations of social distancing due to [place] 19, Audio/video telehealth visit is felt to be most appropriate for this patient at this time.  See MyChart message from today for patient consent regarding telehealth for [facility].  Date:  12/20/2018   ID:  Corey Palmer, DOB 12/18/1970, MRN [id number]  Location: Home  Provider location: [place] [facility] Type of Visit: Established patient   PCP:  London Pepper, MD  HF Cardiology: Dr. Aundra Dubin  Chief Complaint: Fatigue   History of Present Illness: Corey Palmer is a 49 y.o. male who presents via audio/video conferencing for a telehealth visit today.     he denies symptoms worrisome for COVID 30.   49 y.o. with history of nonischemic cardiomyopathy and paroxysmal atrial fibrillation was referred by Dr. Percival Spanish for evaluation of CHF.  He has had a nonischemic cardiomyopathy known since the early 2000s.  His father and mother both had CHF and died in their 33s. He does not drink ETOH or smoke.  He has a Research officer, political party ICD.  LHC in 2002 showed 40% LAD stenosis (nonobstructive).  Last echo in 8/20 from the hospital in [place] showed EF 15% with mildly decreased RV systolic function.  This is lower than in the past, where EF has been in the 25% range.  He has had paroxysmal atrial fibrillation and is on Eliquis.  He has type 2 diabetes and is now on insulin.  He has OSA but does not have CPAP. He was off his cardiac meds for about 2 years but restarted earlier this year.  He was admitted in 8/20 in [place] and again in 9/20 at [facility] with CHF.    CPX in 11/20 showed moderate HF limitation.   He is doing well symptomatically.  He has started on CPAP and finds that he does not need as much sleep.  No significant exertional dyspnea.  No chest pain.  No orthopnea/PND.  Diabetes still poorly controlled, hgbA1c 12.5 when recently checked.  His endocrinologist recently increased his  insulin.     Labs (6/13): K 4.4, creatinine 1.0, BNP 2793, digoxin 0.6 Labs (7/13): K 3.3, creatinine 0.9, digoxin 0.5 Labs (8/13): BNP 121 Labs (9/13): K 3.7, creatinine 0.89 Labs (10/13): K 4.1, creatinine 1.0, BNP 164, TGs 537, HDL 27, LDL 116 Labs (9/20): K 3.8, creatinine 1.06 Labs (11/20): K 4.1, creatinine 1.7 => 1.07, digoxin <0.2  PMH:  1. Type II diabetes with peripheral neuropathy.  2. Hyperlipidemia 3. Atrial fibrillation: Paroxysmal. He was on dofetilide in the past but was not compliant with it.  He was taken off coumadin with upper GI bleed in 3/12 but is now back on apixaban.  4. Upper GI bleed: From antral ulcers, was in 3/12.  5. Gout 6. Dilated cardiomyopathy: Nonischemic.  St Jude ICD in 3009 (complicated by RA perforation and tamponade).  LHC in 2002 with 40% LAD stenosis. Echo (6/13) with EF 20%, akinetic anteroseptum, moderate MR, severe TR, moderately dilated RV.  - Echo 11/10: EF 25-30%.  - Echo 8/12: EF 20-25% - Echo 4/15: EF 20-25% - Echo 8/20: EF 15%, diffuse hypokinesis, mildly decreased RV systolic function, mild-moderate MR.  - CPX (11/20): peak VO2 17.1, VE/VCO2 slope 35, RER 1.10, moderate HF limitation, obesity contributes. - [employer] gene testing was negative for common dilated cardiomyopathy mutations.  7. CAD: LHC 2002 with 40% LAD.  8. OSA  SH: Married with 3 children, 1 son served in  [place].  No ETOH, no smoking.  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     FH: Mother and father both had CHF, uncertain etiology, and died in their 35s.    ROS: All systems reviewed and negative except as per HPI.   Current Outpatient Medications  Medication Sig Dispense Refill  . apixaban (ELIQUIS) 5 MG TABS tablet Take 1 tablet (5 mg total) by mouth 2 (two) times daily. 180 tablet 3  . atorvastatin (LIPITOR) 80 MG tablet Take 1 tablet (80 mg total) by mouth daily. (Patient taking differently: Take 80 mg by mouth at bedtime. ) 90 tablet 3  . carvedilol  (COREG) 25 MG tablet Take 1 tablet (25 mg total) by mouth 2 (two) times daily. 180 tablet 3  . dapagliflozin propanediol (FARXIGA) 10 MG TABS tablet Take 10 mg by mouth daily before breakfast. 30 tablet 6  . digoxin (LANOXIN) 0.125 MG tablet Take 1 tablet (0.125 mg total) by mouth daily. 90 tablet 3  . divalproex (DEPAKOTE ER) 500 MG 24 hr tablet Take 1 tablet (500 mg total) by mouth daily. 90 tablet 0  . Dulaglutide (TRULICITY) 1.5 MA/2.6JF SOPN Inject 1.5 mg into the skin once a week. 4 pen 11  . fenofibrate 160 MG tablet Take 1 tablet (160 mg total) by mouth at bedtime. 90 tablet 0  . FLUoxetine (PROZAC) 20 MG capsule Take 4 capsules (80 mg total) by mouth daily. 120 capsule 2  . furosemide (LASIX) 20 MG tablet Take 40mg (2 tabs) by mouth every morning and 20mg (1 tab) every evening 360 tablet 3  . glucose blood (CONTOUR TEST) test strip 1 each by Other route 2 (two) times daily. And lancets 1/day 180 each 3  . Insulin Glargine (LANTUS SOLOSTAR) 100 UNIT/ML Solostar Pen Inject 60 Units into the skin every morning. And pen needles 1/day 20 pen PRN  . magnesium oxide (MAG-OX) 400 MG tablet Take 400 mg by mouth daily.    Marland Kitchen omeprazole (PRILOSEC) 40 MG capsule Take 1 capsule (40 mg total) by mouth daily. 30 capsule 3  . potassium chloride SA (K-DUR,KLOR-CON) 20 MEQ tablet Take 1 tablet (20 mEq total) by mouth daily. (Patient taking differently: Take 20 mEq by mouth at bedtime. ) 90 tablet 3  . sacubitril-valsartan (ENTRESTO) 49-51 MG Take 1 tablet by mouth 2 (two) times daily. 60 tablet 6  . spironolactone (ALDACTONE) 25 MG tablet Take 1 tablet (25 mg total) by mouth daily. 30 tablet 5   No current facility-administered medications for this encounter.   Exam:  (Video/Tele Health Call; Exam is subjective and or/visual.) General:  Speaks in full sentences. No resp difficulty. Lungs: Normal respiratory effort with conversation.  Abdomen: Non-distended per patient report Extremities: Pt denies  edema. Neuro: Alert & oriented x 3.   Assessment/Plan: 1. Atrial fibrillation: Patient has remained in NSR. He was on dofetilide in the remote past but this was stopped due to noncompliance.  He had an upper GI bleed from antral ulcers in 3/12. He was seen by GI and was deemed safe to restart anticoagulation as long as he remains on a PPI.  - Continue Protonix.  - Continue apixaban 5 mg bid.   2. CAD: Nonobstructive. Continue atorvastatin.  3. Chronic systolic CHF: Nonischemic cardiomyopathy.  Possible familial cardiomyopathy as both parents had cardiomyopathy and died at around 39. However, [employer] gene testing did not show any common mutation for cardiomyopathy. St Jude ICD.  Echo in 8/20 with EF 15% and mildly decreased RV function.  Despite markedly low EF, he reports minimal symptoms, NYHA class I-II.  CPX in 11/20 with moderate HF limitation though obesity likely plays a role.  - Continue digoxin, arrange to check level. - Continue Coreg 25 mg bid.  - Increase Entresto to 49/51 bid, check BMET in 10 days or so. .  - Continue spironolactone 25 mg daily.  - Continue Lasix 40 qam/20 qpm.    - Continue Farxiga 10 mg daily.    4. OSA: Now using CPAP.    COVID screen The patient does not have any symptoms that suggest any further testing/ screening at this time.  Social distancing reinforced today.  Patient Risk: After full review of this patients clinical status, I feel that they are at moderate risk for cardiac decompensation at this time.  Relevant cardiac medications were reviewed at length with the patient today. The patient does not have concerns regarding their medications at this time.   Recommended follow-up:  6 wks.   Today, I have spent 17 minutes with the patient with telehealth technology discussing the above issues .    Signed, Loralie Champagne, MD  12/20/2018  [facility] [address] [facility] [place]  [address] [phone] (office) [phone] (fax)

## 2019-02-22 ENCOUNTER — Other Ambulatory Visit (HOSPITAL_COMMUNITY): Payer: Self-pay | Admitting: *Deleted

## 2019-02-22 MED ORDER — DAPAGLIFLOZIN PROPANEDIOL 10 MG PO TABS: 10.0000 mg | ORAL_TABLET | Freq: Every day | ORAL | 6 refills | Status: AC

## 2019-03-04 ENCOUNTER — Telehealth: Payer: BC Managed Care – PPO | Admitting: Cardiology

## 2019-03-05 ENCOUNTER — Other Ambulatory Visit (HOSPITAL_COMMUNITY): Payer: BC Managed Care – PPO

## 2019-03-08 ENCOUNTER — Ambulatory Visit (HOSPITAL_COMMUNITY)
Admission: RE | Admit: 2019-03-08 | Discharge: 2019-03-08 | Disposition: A | Discharge status: Home / Self Care | Payer: BC Managed Care – PPO | Source: Ambulatory Visit | Attending: Internal Medicine | Admitting: Internal Medicine

## 2019-03-08 ENCOUNTER — Other Ambulatory Visit: Payer: Self-pay

## 2019-03-08 DIAGNOSIS — I5022 Chronic systolic (congestive) heart failure: Secondary | ICD-10-CM | POA: Diagnosis not present

## 2019-03-08 LAB — BASIC METABOLIC PANEL
Anion gap: 12 (ref 5–15)
BUN: 25 mg/dL — ABNORMAL HIGH (ref 6–20)
CO2: 26 mmol/L (ref 22–32)
Calcium: 9 mg/dL (ref 8.9–10.3)
Chloride: 98 mmol/L (ref 98–111)
Creatinine, Ser: 1.38 mg/dL — ABNORMAL HIGH (ref 0.61–1.24)
GFR calc Af Amer: >60 mL/min (ref >60)
GFR calc non Af Amer: 59 mL/min — ABNORMAL LOW (ref >60)
Glucose, Bld: 280 mg/dL — ABNORMAL HIGH (ref 70–99)
Potassium: 4.5 mmol/L (ref 3.5–5.1)
Sodium: 136 mmol/L (ref 135–145)

## 2019-03-08 LAB — DIGOXIN LEVEL: Digoxin Level: 0.3 ng/mL — ABNORMAL LOW (ref 0.8–2.0)

## 2019-03-10 DIAGNOSIS — G4733 Obstructive sleep apnea (adult) (pediatric): Secondary | ICD-10-CM | POA: Diagnosis not present

## 2019-03-11 ENCOUNTER — Encounter: Payer: Self-pay | Admitting: Cardiology

## 2019-03-11 ENCOUNTER — Telehealth (INDEPENDENT_AMBULATORY_CARE_PROVIDER_SITE_OTHER): Payer: BC Managed Care – PPO | Admitting: Cardiology

## 2019-03-11 VITALS — Ht 73.0 in | Wt 246.0 lb

## 2019-03-11 DIAGNOSIS — Z8616 Personal history of COVID-19: Secondary | ICD-10-CM | POA: Diagnosis not present

## 2019-03-11 DIAGNOSIS — E669 Obesity, unspecified: Secondary | ICD-10-CM | POA: Diagnosis not present

## 2019-03-11 DIAGNOSIS — G4733 Obstructive sleep apnea (adult) (pediatric): Secondary | ICD-10-CM | POA: Diagnosis not present

## 2019-03-11 DIAGNOSIS — I10 Essential (primary) hypertension: Secondary | ICD-10-CM

## 2019-03-11 NOTE — Progress Notes (Signed)
Virtual Visit via Telephone Note   This visit type was conducted due to national recommendations for restrictions regarding the COVID-19 Pandemic (e.g. social distancing) in an effort to limit this patient's exposure and mitigate transmission in our community.  Due to his co-morbid illnesses, this patient is at least at moderate risk for complications without adequate follow up.  This format is felt to be most appropriate for this patient at this time.  The patient did not have access to video technology/had technical difficulties with video requiring transitioning to audio format only (telephone).  All issues noted in this document were discussed and addressed.  No physical exam could be performed with this format.  Please refer to the patient's chart for his  consent to telehealth for [facility].  Evaluation Performed:  Follow-up visit  This visit type was conducted due to national recommendations for restrictions regarding the COVID-19 Pandemic (e.g. social distancing).  This format is felt to be most appropriate for this patient at this time.  All issues noted in this document were discussed and addressed.  No physical exam was performed (except for noted visual exam findings with Video Visits).  Please refer to the patient's chart (MyChart message for video visits and phone note for telephone visits) for the patient's consent to telehealth for [facility].  Date:  03/11/2019   ID:  Corey Palmer, DOB 1970-07-05, MRN [id number]  Patient Location:  Home  Provider location:   [place]  PCP:  Sueanne Margarita, MD  Cardiologist: Loralie Champagne, MD Sleep Medicine:  Fransico Him, MD Electrophysiologist:  Cristopher Peru, MD   Chief Complaint:  OSA  History of Present Illness:    Corey Palmer is a 49 y.o. male who presents via audio/video conferencing for a telehealth visit today.    This is a 49yo male with a hx of NICM s/p ICD, PAF, chronic systolic CHF with EF 27% and OSA but never  got on CPAP.  He was referred for split night sleep study which showed moderate OSA with an AHI of 26/hr and O2 sats as low as 85%.  He underwent CPAP titration but due to ongoing respiratory events had to be changed to BiPAP and was titrated on BiPAP to 19/15cm H2O.    He is doing well with his BiPAP device and thinks that he has gotten used to it.  He tolerates the full face mask and feels the pressure is adequate.  Since going on BiPAP he feels rested in the am and has no significant daytime sleepiness.  He denies any significant mouth or nasal dryness or nasal congestion.  He does not think that he snores.    The patient does not have symptoms concerning for COVID-19 infection (fever, chills, cough, or new shortness of breath).   Prior CV studies:   The following studies were reviewed today:  PAP compliance download in [place] and split night sleep study  Past Medical History:  Diagnosis Date  . Anginal pain ([place])   . Anxiety   . Arthritis   . Automatic implantable cardioverter-defibrillator in situ 2007   Medtronic   . Bipolar disorder ([place])   . CAD (coronary artery disease)    Cardiac cath (08/19/2000) - Nonobstructive, 40% stenosis of LAD,.   . Cancer [facility] 2013   benign stomach cancer  . CHF (congestive heart failure) ([place])   . Chondrocalcinosis of right knee 06/11/2013  . Chronic systolic heart failure ([place])    2D echo (03/5007) - Systolic  function severely reduced., LV EF 20-25%. Akinesis of apical and anteroseptal mycoardium.  last 2D echo (11/2008 ), LV EF 25-30%, diffuse hypokinesis, and grade 1 diastolic dysfunction.  . Depression   . Diabetes uncomplicated adult-type II    on insulin therapy, a1c 12.9 in 01/2011  . Dilated cardiomyopathy ([place])    status post ICD placement in 2008 c/b tearing at the atrial junction resulting in tamponade and urgent thoracotomy  . Dyslipidemia   . Dysrhythmia   . GERD (gastroesophageal reflux disease)   . Gout, unspecified   . Hepatic  steatosis 2011   seen on ultrasound.   . History of kidney stones   . Hx of echocardiogram    Echo (04/2013):  Mild LVH, EF 20-25%, mild LAE, mild to mod RVE with mild reduced RVSF.  Marland Kitchen Hypertension   . Obesity (BMI 30.0-34.9)   . Pacemaker   . Paroxysmal atrial fibrillation ([facility])    on chronic coumadin, goal INR 2-3. status post direct current  . Peptic ulcer   . Shortness of breath   . Sleep apnea    does not have CPAP   Past Surgical History:  Procedure Laterality Date  . CARDIAC CATHETERIZATION    . CARDIAC PACEMAKER PLACEMENT    . CHOLECYSTECTOMY    . COLONOSCOPY    . CORONARY ARTERY BYPASS GRAFT    . ESOPHAGOGASTRODUODENOSCOPY  04/05/2011   Procedure: ESOPHAGOGASTRODUODENOSCOPY (EGD);  Surgeon: Irene Shipper, MD;  [facility]: [facility] ENDOSCOPY;  Service: Endoscopy;  Laterality: N/A;  . JOINT REPLACEMENT     left knee replacement  . KNEE ARTHROSCOPY Right 06/11/2013   Procedure: RIGHT KNEE ARTHROSCOPY with chondroplasty;  Surgeon: Johnny Bridge, MD;  Location: [place];  Service: Orthopedics;  Laterality: Right;  . TOTAL KNEE ARTHROPLASTY Left      Current Meds  Medication Sig  . apixaban (ELIQUIS) 5 MG TABS tablet Take 1 tablet (5 mg total) by mouth 2 (two) times daily.  . busPIRone (BUSPAR) 10 MG tablet Take 10 mg by mouth 3 (three) times daily.  . carvedilol (COREG) 25 MG tablet Take 1 tablet (25 mg total) by mouth 2 (two) times daily.  . dapagliflozin propanediol (FARXIGA) 10 MG TABS tablet Take 10 mg by mouth daily before breakfast.  . digoxin (LANOXIN) 0.125 MG tablet Take 1 tablet (0.125 mg total) by mouth daily.  . divalproex (DEPAKOTE ER) 500 MG 24 hr tablet Take 1 tablet (500 mg total) by mouth daily.  . Dulaglutide (TRULICITY) 1.5 UX/3.2TF SOPN Inject 1.5 mg into the skin once a week.  Marland Kitchen FLUoxetine (PROZAC) 20 MG capsule Take 4 capsules (80 mg total) by mouth daily.  . furosemide (LASIX) 20 MG tablet Take 40mg (2 tabs) by mouth every morning and 20mg (1 tab) every evening  .  glucose blood (CONTOUR TEST) test strip 1 each by Other route 2 (two) times daily. And lancets 1/day  . hydrOXYzine (VISTARIL) 50 MG capsule Take 50 mg by mouth 3 (three) times daily as needed.  . Insulin Glargine (LANTUS SOLOSTAR) 100 UNIT/ML Solostar Pen Inject 60 Units into the skin every morning. And pen needles 1/day  . magnesium oxide (MAG-OX) 400 MG tablet Take 400 mg by mouth daily.  Marland Kitchen omeprazole (PRILOSEC) 40 MG capsule Take 1 capsule (40 mg total) by mouth daily.  . potassium chloride SA (K-DUR,KLOR-CON) 20 MEQ tablet Take 1 tablet (20 mEq total) by mouth daily. (Patient taking differently: Take 20 mEq by mouth at bedtime. )  . sacubitril-valsartan (ENTRESTO) 49-51 MG  Take 1 tablet by mouth 2 (two) times daily.  Marland Kitchen spironolactone (ALDACTONE) 25 MG tablet Take 1 tablet (25 mg total) by mouth daily.     Allergies:   Orange fruit, Penicillins, and Jardiance [empagliflozin]   Social History   Tobacco Use  . Smoking status: Never Smoker  . Smokeless tobacco: Never Used  Substance Use Topics  . Alcohol use: No  . Drug use: No     Family Hx: [name] family history includes Coronary artery disease (age of onset: 62) in his father and mother; Diabetes in his father and mother; Gout in his mother; Heart attack (age of onset: 86) in his mother; Heart disease in his brother, father, and mother.  ROS:   Please see the history of present illness.     All other systems reviewed and are negative.   Labs/Other Tests and Data Reviewed:    Recent Labs: 03/23/2018: ALT 26 10/01/2018: B Natriuretic Peptide 659.0; TSH 3.009 10/02/2018: Hemoglobin 15.3; Platelets 219 10/06/2018: Magnesium 1.6 03/08/2019: BUN 25; Creatinine, Ser 1.38; Potassium 4.5; Sodium 136   Recent Lipid Panel Lab Results  Component Value Date/Time   CHOL 176 10/01/2018 03:15 PM   TRIG 126 10/01/2018 03:15 PM   HDL 25 (L) 10/01/2018 03:15 PM   CHOLHDL 7.0 10/01/2018 03:15 PM   LDLCALC 126 (H) 10/01/2018 03:15 PM    LDLDIRECT 116.5 11/01/2011 09:50 AM    Wt Readings from Last 3 Encounters:  03/11/19 246 lb (111.6 kg)  02/03/19 261 lb (118.4 kg)  12/06/18 247 lb (112 kg)     Objective:    Vital Signs:  Ht 6\' 1"  (1.854 m)   Wt 246 lb (111.6 kg)   BMI 32.46 kg/m     ASSESSMENT & PLAN:    1.  OSA - The pathophysiology of obstructive sleep apnea , it's cardiovascular consequences & modes of treatment including CPAP were discused with the patient in detail & they evidenced understanding.  The patient is tolerating PAP therapy well without any problems. The PAP download was reviewed today and showed an AHI of 2.4/hr on 19/15 cm H2O with 0% compliance in using more than 4 hours nightly. He has only used his device 4 out of the last 30 days. He tells me that he has been using it every night but there is no data past 02/23/2019.  I have encouraged him to be compliant with his device.  He knows that if he does not reach 70% usage more than 4 hours nightly insurance will make him turn his device in. I will reach out to his DME to check his device to find out why his device his is not recording usage.   2.  HTN -Bp controlled -continue carvedilol 25mg  BID, Entresto 49-51mg  BID and Spiro 25mg  daily -creatinine was 1.07 and K+ 4.1 in Nov 2020 after review by me of [facility]  3.  Obesity -I have encouraged him to get into a routine exercise program and cut back on carbs and portions.   COVID-19 Education: The signs and symptoms of COVID-19 were discussed with the patient and how to seek care for testing (follow up with PCP or arrange E-visit).  The importance of social distancing was discussed today.  Patient Risk:   After full review of this patient's clinical status, I feel that they are at least moderate risk at this time.  Time:   Today, I have spent 20 minutes on telemedicine discussing medical problems including OSA, HTN, Obesity and reviewing  patient's chart including split night sleep study, outside labs  and PAP compliance download from [place].  Medication Adjustments/Labs and Tests Ordered: Current medicines are reviewed at length with the patient today.  Concerns regarding medicines are outlined above.  Tests Ordered: No orders of the defined types were placed in this encounter.  Medication Changes: No orders of the defined types were placed in this encounter.   Disposition:  Follow up in 1 year(s)  Signed, Fransico Him, MD  03/11/2019 2:11 PM    [facility]

## 2019-03-12 ENCOUNTER — Telehealth: Payer: Self-pay | Admitting: *Deleted

## 2019-03-12 NOTE — Telephone Encounter (Signed)
-----   Message from Sueanne Margarita, MD sent at 03/11/2019  2:16 PM EST ----- Please call DME to try to figure out what is wrong with his device that it is not recording on [facility] - he says that he is using it 8 hours every night. Followup with me in 1 year

## 2019-03-12 NOTE — Telephone Encounter (Signed)
Corey Palmer is going to have patient come in next Tuesday 03/26/19 for a download and talk with him about his cpap.

## 2019-03-16 ENCOUNTER — Encounter (HOSPITAL_COMMUNITY): Payer: Self-pay

## 2019-03-16 DIAGNOSIS — I440 Atrioventricular block, first degree: Secondary | ICD-10-CM | POA: Diagnosis not present

## 2019-03-16 DIAGNOSIS — R61 Generalized hyperhidrosis: Secondary | ICD-10-CM | POA: Diagnosis not present

## 2019-03-16 DIAGNOSIS — I340 Nonrheumatic mitral (valve) insufficiency: Secondary | ICD-10-CM | POA: Diagnosis not present

## 2019-03-16 DIAGNOSIS — I252 Old myocardial infarction: Secondary | ICD-10-CM | POA: Diagnosis not present

## 2019-03-16 DIAGNOSIS — E1165 Type 2 diabetes mellitus with hyperglycemia: Secondary | ICD-10-CM | POA: Diagnosis not present

## 2019-03-16 DIAGNOSIS — Z6830 Body mass index (BMI) 30.0-30.9, adult: Secondary | ICD-10-CM | POA: Diagnosis not present

## 2019-03-16 DIAGNOSIS — I5022 Chronic systolic (congestive) heart failure: Secondary | ICD-10-CM | POA: Diagnosis not present

## 2019-03-16 DIAGNOSIS — E669 Obesity, unspecified: Secondary | ICD-10-CM | POA: Diagnosis not present

## 2019-03-16 DIAGNOSIS — Z7901 Long term (current) use of anticoagulants: Secondary | ICD-10-CM | POA: Diagnosis not present

## 2019-03-16 DIAGNOSIS — I110 Hypertensive heart disease with heart failure: Secondary | ICD-10-CM | POA: Diagnosis not present

## 2019-03-16 DIAGNOSIS — R079 Chest pain, unspecified: Secondary | ICD-10-CM | POA: Diagnosis not present

## 2019-03-16 DIAGNOSIS — E119 Type 2 diabetes mellitus without complications: Secondary | ICD-10-CM | POA: Diagnosis not present

## 2019-03-16 DIAGNOSIS — Z794 Long term (current) use of insulin: Secondary | ICD-10-CM | POA: Diagnosis not present

## 2019-03-16 DIAGNOSIS — I420 Dilated cardiomyopathy: Secondary | ICD-10-CM | POA: Diagnosis not present

## 2019-03-16 DIAGNOSIS — I25118 Atherosclerotic heart disease of native coronary artery with other forms of angina pectoris: Secondary | ICD-10-CM | POA: Diagnosis not present

## 2019-03-16 DIAGNOSIS — Z95810 Presence of automatic (implantable) cardiac defibrillator: Secondary | ICD-10-CM | POA: Diagnosis not present

## 2019-03-16 DIAGNOSIS — I4891 Unspecified atrial fibrillation: Secondary | ICD-10-CM | POA: Diagnosis not present

## 2019-03-16 DIAGNOSIS — I10 Essential (primary) hypertension: Secondary | ICD-10-CM | POA: Diagnosis not present

## 2019-03-16 DIAGNOSIS — Z79899 Other long term (current) drug therapy: Secondary | ICD-10-CM | POA: Diagnosis not present

## 2019-03-16 DIAGNOSIS — I208 Other forms of angina pectoris: Secondary | ICD-10-CM | POA: Diagnosis not present

## 2019-03-17 DIAGNOSIS — I440 Atrioventricular block, first degree: Secondary | ICD-10-CM | POA: Diagnosis not present

## 2019-03-17 DIAGNOSIS — I208 Other forms of angina pectoris: Secondary | ICD-10-CM | POA: Diagnosis not present

## 2019-03-17 DIAGNOSIS — R072 Precordial pain: Secondary | ICD-10-CM | POA: Diagnosis not present

## 2019-03-17 MED ORDER — SPIRONOLACTONE 25 MG PO TABS
25.00 | ORAL_TABLET | ORAL | Status: DC
Start: 2019-03-17 — End: 2019-03-17

## 2019-03-17 MED ORDER — DEXTROSE 10 % IV SOLN
125.00 | INTRAVENOUS | Status: DC
Start: ? — End: 2019-03-17

## 2019-03-17 MED ORDER — DIVALPROEX SODIUM ER 500 MG PO TB24
500.00 | ORAL_TABLET | ORAL | Status: DC
Start: 2019-03-18 — End: 2019-03-17

## 2019-03-17 MED ORDER — GLUCOSE 40 % PO GEL
15.00 | ORAL | Status: DC
Start: ? — End: 2019-03-17

## 2019-03-17 MED ORDER — INFLUENZA VAC SPLIT QUAD 0.5 ML IM SUSY
0.50 | PREFILLED_SYRINGE | INTRAMUSCULAR | Status: DC
Start: ? — End: 2019-03-17

## 2019-03-17 MED ORDER — PANTOPRAZOLE SODIUM 40 MG PO TBEC
40.00 | DELAYED_RELEASE_TABLET | ORAL | Status: DC
Start: 2019-03-17 — End: 2019-03-17

## 2019-03-17 MED ORDER — GENERIC EXTERNAL MEDICATION
Status: DC
Start: 2019-03-17 — End: 2019-03-17

## 2019-03-17 MED ORDER — INSULIN GLARGINE 100 UNIT/ML ~~LOC~~ SOLN
50.00 | SUBCUTANEOUS | Status: DC
Start: 2019-03-18 — End: 2019-03-17

## 2019-03-17 MED ORDER — LISINOPRIL 20 MG PO TABS
20.00 | ORAL_TABLET | ORAL | Status: DC
Start: 2019-03-17 — End: 2019-03-17

## 2019-03-17 MED ORDER — HYDROXYZINE HCL 25 MG PO TABS
50.00 | ORAL_TABLET | ORAL | Status: DC
Start: ? — End: 2019-03-17

## 2019-03-17 MED ORDER — BUSPIRONE HCL 10 MG PO TABS
10.00 | ORAL_TABLET | ORAL | Status: DC
Start: 2019-03-17 — End: 2019-03-17

## 2019-03-17 MED ORDER — INSULIN LISPRO 100 UNIT/ML ~~LOC~~ SOLN
3.00 | SUBCUTANEOUS | Status: DC
Start: 2019-03-17 — End: 2019-03-17

## 2019-03-17 MED ORDER — DIGOXIN 125 MCG PO TABS
125.00 | ORAL_TABLET | ORAL | Status: DC
Start: 2019-03-18 — End: 2019-03-17

## 2019-03-17 MED ORDER — FUROSEMIDE 40 MG PO TABS
40.00 | ORAL_TABLET | ORAL | Status: DC
Start: 2019-03-17 — End: 2019-03-17

## 2019-03-17 MED ORDER — CARVEDILOL 12.5 MG PO TABS
25.00 | ORAL_TABLET | ORAL | Status: DC
Start: 2019-03-17 — End: 2019-03-17

## 2019-03-17 MED ORDER — APIXABAN 5 MG PO TABS
5.00 | ORAL_TABLET | ORAL | Status: DC
Start: 2019-03-17 — End: 2019-03-17

## 2019-03-19 ENCOUNTER — Ambulatory Visit (INDEPENDENT_AMBULATORY_CARE_PROVIDER_SITE_OTHER): Payer: BC Managed Care – PPO | Admitting: *Deleted

## 2019-03-19 DIAGNOSIS — I5022 Chronic systolic (congestive) heart failure: Secondary | ICD-10-CM | POA: Diagnosis not present

## 2019-03-20 LAB — CUP PACEART REMOTE DEVICE CHECK
Battery Remaining Longevity: 82 mo
Battery Remaining Percentage: 77 %
Battery Voltage: 2.99 V
Brady Statistic RV Percent Paced: 1 %
Date Time Interrogation Session: 20210219180619
HighPow Impedance: 72 Ohm
HighPow Impedance: 72 Ohm
Implantable Lead Implant Date: 20090817
Implantable Lead Location: 753860
Implantable Lead Model: 6935
Implantable Pulse Generator Implant Date: 20180419
Lead Channel Impedance Value: 560 Ohm
Lead Channel Pacing Threshold Amplitude: 0.75 V
Lead Channel Pacing Threshold Pulse Width: 0.5 ms
Lead Channel Sensing Intrinsic Amplitude: 12 mV
Lead Channel Setting Pacing Amplitude: 2.5 V
Lead Channel Setting Pacing Pulse Width: 0.5 ms
Lead Channel Setting Sensing Sensitivity: 0.5 mV
Pulse Gen Serial Number: 7411315

## 2019-04-05 ENCOUNTER — Encounter (HOSPITAL_COMMUNITY): Payer: BC Managed Care – PPO | Admitting: Cardiology

## 2019-04-06 ENCOUNTER — Ambulatory Visit: Payer: BC Managed Care – PPO | Admitting: Endocrinology

## 2019-04-06 DIAGNOSIS — Z0289 Encounter for other administrative examinations: Secondary | ICD-10-CM

## 2019-05-24 ENCOUNTER — Telehealth (HOSPITAL_COMMUNITY): Payer: Self-pay

## 2019-05-24 ENCOUNTER — Other Ambulatory Visit (HOSPITAL_COMMUNITY): Payer: Self-pay | Admitting: Psychiatry

## 2019-05-24 MED ORDER — DIVALPROEX SODIUM ER 500 MG PO TB24: 500.0000 mg | ORAL_TABLET | Freq: Every day | ORAL | 0 refills | Status: DC

## 2019-05-24 NOTE — Telephone Encounter (Signed)
Spoke with patient on the phone regarding refill request. States he has "plenty" of his medications other than his Depakote. Requesting refill. Pt reminded he would need to see MD as he has no upcoming appointments and last appointment was a no show. Pt states he is "really busy" and is unsure if he would be able to make time to make appointment. States he "really needs" his Depakote. Please advise.

## 2019-05-24 NOTE — Telephone Encounter (Signed)
Pt aware of prescription. Encouraged to make appointment and discussed the importance of followup appointments with MD. Pt states he would make appointment. Transferred to appointment scheduler.

## 2019-05-24 NOTE — Telephone Encounter (Signed)
He indeed "needs his Depakote" so I reordered it for yet another 3 months.

## 2019-06-18 ENCOUNTER — Ambulatory Visit (INDEPENDENT_AMBULATORY_CARE_PROVIDER_SITE_OTHER): Payer: BC Managed Care – PPO | Admitting: *Deleted

## 2019-06-18 DIAGNOSIS — I255 Ischemic cardiomyopathy: Secondary | ICD-10-CM | POA: Diagnosis not present

## 2019-06-18 DIAGNOSIS — I5023 Acute on chronic systolic (congestive) heart failure: Secondary | ICD-10-CM

## 2019-06-21 ENCOUNTER — Other Ambulatory Visit (HOSPITAL_COMMUNITY): Payer: Self-pay | Admitting: Cardiology

## 2019-06-21 LAB — CUP PACEART REMOTE DEVICE CHECK
Battery Remaining Longevity: 79 mo
Battery Remaining Percentage: 74 %
Battery Voltage: 2.99 V
Brady Statistic RV Percent Paced: 1 %
Date Time Interrogation Session: 20210522053039
HighPow Impedance: 66 Ohm
HighPow Impedance: 66 Ohm
Implantable Lead Implant Date: 20090817
Implantable Lead Location: 753860
Implantable Lead Model: 6935
Implantable Pulse Generator Implant Date: 20180419
Lead Channel Impedance Value: 490 Ohm
Lead Channel Pacing Threshold Amplitude: 0.75 V
Lead Channel Pacing Threshold Pulse Width: 0.5 ms
Lead Channel Sensing Intrinsic Amplitude: 12 mV
Lead Channel Setting Pacing Amplitude: 2.5 V
Lead Channel Setting Pacing Pulse Width: 0.5 ms
Lead Channel Setting Sensing Sensitivity: 0.5 mV
Pulse Gen Serial Number: 7411315

## 2019-06-21 NOTE — Progress Notes (Signed)
Remote ICD transmission.   

## 2019-07-01 DIAGNOSIS — M109 Gout, unspecified: Secondary | ICD-10-CM | POA: Diagnosis not present

## 2019-08-12 DIAGNOSIS — R1031 Right lower quadrant pain: Secondary | ICD-10-CM | POA: Diagnosis not present

## 2019-08-13 ENCOUNTER — Other Ambulatory Visit: Payer: Self-pay

## 2019-08-13 ENCOUNTER — Other Ambulatory Visit: Payer: Self-pay | Admitting: Endocrinology

## 2019-08-13 MED ORDER — LANTUS SOLOSTAR 100 UNIT/ML ~~LOC~~ SOPN: 60.0000 [IU] | PEN_INJECTOR | SUBCUTANEOUS | 1 refills | Status: DC

## 2019-08-13 NOTE — Telephone Encounter (Signed)
Would you like to change Basaglar to a covered alternative?

## 2019-08-13 NOTE — Telephone Encounter (Signed)
1.  Please schedule f/u appt 2.  Then please change Lantus, and refill x 1, pending that appt.

## 2019-08-13 NOTE — Telephone Encounter (Signed)
Patient scheduled for Monday 08/16/19.

## 2019-08-13 NOTE — Telephone Encounter (Signed)
Please schedule pt for f/u visit.

## 2019-08-16 ENCOUNTER — Other Ambulatory Visit: Payer: Self-pay

## 2019-08-16 ENCOUNTER — Encounter: Payer: Self-pay | Admitting: Endocrinology

## 2019-08-16 ENCOUNTER — Telehealth (INDEPENDENT_AMBULATORY_CARE_PROVIDER_SITE_OTHER): Payer: BC Managed Care – PPO | Admitting: Endocrinology

## 2019-08-16 DIAGNOSIS — E1165 Type 2 diabetes mellitus with hyperglycemia: Secondary | ICD-10-CM | POA: Diagnosis not present

## 2019-08-16 MED ORDER — BASAGLAR KWIKPEN 100 UNIT/ML ~~LOC~~ SOPN: 40.0000 [IU] | PEN_INJECTOR | SUBCUTANEOUS | 11 refills | Status: DC

## 2019-08-16 MED ORDER — OZEMPIC (0.25 OR 0.5 MG/DOSE) 2 MG/1.5ML ~~LOC~~ SOPN: 0.5000 mg | PEN_INJECTOR | SUBCUTANEOUS | 11 refills | Status: DC

## 2019-08-16 NOTE — Progress Notes (Signed)
Subjective:    Patient ID: Corey Palmer, male    DOB: 1970/09/05, 49 y.o.   MRN: [id number]  HPI  telehealth visit today via video visit.  Alternatives to telehealth are presented to this patient, and the patient agrees to the telehealth visit. Pt is advised of the cost of the visit, and agrees to this, also.   Patient is at home, and I am at the office.   Persons attending the telehealth visit: the patient and I Pt returns for f/u of diabetes mellitus:  DM type: Insulin-requiring type 2.  Dx'ed: 4627 Complications: polyneuropathy, CAD, and renal insuff.   Therapy: insulin since 2011, Farxiga, and trulicity.   DKA: never Severe hypoglycemia: once (2015) Pancreatitis: never Pancreatic imaging: normal on 2013 CT SDOH: He stopped Trulicity, due to cost.   Other: ins declines farxiga; he declines continuous glucose monitor, due to cost; he works as heavy Office manager; he stopped Omnipod, due to cost; he takes QD insulin, after poor results with multiple daily injections.   Interval history: Pt says ins no longer covers Lantus.  Pt says cbg varies from 110-150.  pt states he feels well in general.  He wants to try Ozempic.  He says Trulicity caused bloating.   Past Medical History:  Diagnosis Date  . Anginal pain ([place])   . Anxiety   . Arthritis   . Automatic implantable cardioverter-defibrillator in situ 2007   [employer]   . Bipolar disorder ([place])   . CAD (coronary artery disease)    Cardiac cath (08/19/2000) - Nonobstructive, 40% stenosis of LAD,.   . Cancer [facility]) 2013   benign stomach cancer  . CHF (congestive heart failure) ([place])   . Chondrocalcinosis of right knee 06/11/2013  . Chronic systolic heart failure ([place])    2D echo (03/5007) - Systolic function severely reduced., LV EF 20-25%. Akinesis of apical and anteroseptal mycoardium.  last 2D echo (11/2008 ), LV EF 25-30%, diffuse hypokinesis, and grade 1 diastolic dysfunction.  . Depression   . Diabetes uncomplicated  adult-type II    on insulin therapy, a1c 12.9 in 01/2011  . Dilated cardiomyopathy ([place])    status post ICD placement in 2008 c/b tearing at the atrial junction resulting in tamponade and urgent thoracotomy  . Dyslipidemia   . Dysrhythmia   . GERD (gastroesophageal reflux disease)   . Gout, unspecified   . Hepatic steatosis 2011   seen on ultrasound.   . History of kidney stones   . Hx of echocardiogram    Echo (04/2013):  Mild LVH, EF 20-25%, mild LAE, mild to mod RVE with mild reduced RVSF.  Marland Kitchen Hypertension   . Obesity (BMI 30.0-34.9)   . Pacemaker   . Paroxysmal atrial fibrillation (H[facility])    on chronic coumadin, goal INR 2-3. status post direct current  . Peptic ulcer   . Shortness of breath   . Sleep apnea    does not have CPAP    Past Surgical History:  Procedure Laterality Date  . CARDIAC CATHETERIZATION    . CARDIAC PACEMAKER PLACEMENT    . CHOLECYSTECTOMY    . COLONOSCOPY    . CORONARY ARTERY BYPASS GRAFT    . ESOPHAGOGASTRODUODENOSCOPY  04/05/2011   Procedure: ESOPHAGOGASTRODUODENOSCOPY (EGD);  Surgeon: Irene Shipper, MD;  Location: [facility] END[facility]COPY;  Service: Endoscopy;  Laterality: N/A;  . JOINT REPLACEMENT     left knee replacement  . KNEE ARTHROSCOPY Right 06/11/2013   Procedure: RIGHT KNEE ARTHROSCOPY with chondroplasty;  Surgeon:  Johnny Bridge, MD;  Location: [facility];  Service: Orthopedics;  Laterality: Right;  . TOTAL KNEE ARTHROPLASTY Left     Social History   Socioeconomic History  . Marital status: Married    Spouse name: Not on file  . Number of children: 3  . Years of education: college  . Highest education level: Not on file  Occupational History  . Occupation: bar tender  Tobacco Use  . Smoking status: Never Smoker  . Smokeless tobacco: Never Used  Vaping Use  . Vaping Use: Never used  Substance and Sexual Activity  . Alcohol use: No  . Drug use: No  . Sexual activity: Yes  Other Topics Concern  . Not on file  Social History Narrative   Lives  in [place].   Studied sports medicine in college, got a BS - used to work with the [employer].         Social Determinants of Health   Financial Resource Strain:   . Difficulty of Paying Living Expenses:   Food Insecurity:   . Worried About Charity fundraiser in the Last Year:   . Arboriculturist in the Last Year:   Transportation Needs:   . Film/video editor (Medical):   Marland Kitchen Lack of Transportation (Non-Medical):   Physical Activity:   . Days of Exercise per Week:   . Minutes of Exercise per Session:   Stress:   . Feeling of Stress :   Social Connections:   . Frequency of Communication with Friends and Family:   . Frequency of Social Gatherings with Friends and Family:   . Attends Religious Services:   . Active Member of Clubs or Organizations:   . Attends Archivist Meetings:   Marland Kitchen Marital Status:   Intimate Partner Violence:   . Fear of Current or Ex-Partner:   . Emotionally Abused:   Marland Kitchen Physically Abused:   . Sexually Abused:     Current Outpatient Medications on File Prior to Visit  Medication Sig Dispense Refill  . apixaban (ELIQUIS) 5 MG TABS tablet Take 1 tablet (5 mg total) by mouth 2 (two) times daily. 180 tablet 3  . atorvastatin (LIPITOR) 80 MG tablet Take 1 tablet (80 mg total) by mouth daily. 90 tablet 3  . busPIRone (BUSPAR) 10 MG tablet Take 10 mg by mouth 3 (three) times daily.    . carvedilol (COREG) 25 MG tablet Take 1 tablet (25 mg total) by mouth 2 (two) times daily. 180 tablet 3  . dapagliflozin propanediol (FARXIGA) 10 MG TABS tablet Take 10 mg by mouth daily before breakfast. 30 tablet 6  . digoxin (LANOXIN) 0.125 MG tablet Take 1 tablet (0.125 mg total) by mouth daily. 90 tablet 3  . divalproex (DEPAKOTE ER) 500 MG 24 hr tablet Take 1 tablet (500 mg total) by mouth daily. 90 tablet 0  . furosemide (LASIX) 20 MG tablet Take 40mg (2 tabs) by mouth every morning and 20mg (1 tab) every evening 360 tablet 3  . glucose blood (CONTOUR TEST) test  strip 1 each by Other route 2 (two) times daily. And lancets 1/day 180 each 3  . hydrOXYzine (VISTARIL) 50 MG capsule Take 50 mg by mouth 3 (three) times daily as needed.    . magnesium oxide (MAG-OX) 400 MG tablet Take 400 mg by mouth daily.    Marland Kitchen omeprazole (PRILOSEC) 40 MG capsule Take 1 capsule (40 mg total) by mouth daily. 30 capsule 3  . potassium chloride SA (K-DUR,KLOR-CON)  20 MEQ tablet Take 1 tablet (20 mEq total) by mouth daily. (Patient taking differently: Take 20 mEq by mouth at bedtime. ) 90 tablet 3  . sacubitril-valsartan (ENTRESTO) 49-51 MG Take 1 tablet by mouth 2 (two) times daily. 60 tablet 6  . spironolactone (ALDACTONE) 25 MG tablet Take 1 tablet (25 mg total) by mouth daily. Needs appt 90 tablet 3  . fenofibrate 160 MG tablet Take 1 tablet (160 mg total) by mouth at bedtime. (Patient not taking: Reported on 03/11/2019) 90 tablet 0  . FLUoxetine (PROZAC) 20 MG capsule Take 4 capsules (80 mg total) by mouth daily. 120 capsule 2   No current facility-administered medications on file prior to visit.    Allergies  Allergen Reactions  . Orange Fruit Anaphylaxis    Per Pt- Blister around lips and Hives all over   . Penicillins Anaphylaxis    Did it involve swelling of the face/tongue/throat, SOB, or low BP? Yes Did it involve sudden or severe rash/hives, skin peeling, or any reaction on the inside of your mouth or nose? Yes Did you need to seek medical attention at a hospital or doctor's office? No When did it last happen?childhood If all above answers are "NO", may proceed with cephalosporin use.  [name] [Empagliflozin] Itching    Family History  Problem Relation Age of Onset  . Diabetes Mother   . Coronary artery disease Mother 6  . Gout Mother   . Heart disease Mother   . Heart attack Mother 21  . Diabetes Father   . Coronary artery disease Father 24       Died in a house fire   . Heart disease Father   . Heart disease Brother     There were no  vitals taken for this visit.  Review of Systems He denies hypoglycemia.     Objective:   Physical Exam       Assessment & Plan:  Insulin-requiring type 2 DM: apparently well-controlled Bloating, due to Trulicity.  We discussed.  He wants to try Ozempic.   Patient Instructions  I have sent 2 prescriptions to your pharmacy: to change Lantus to [facility], and to start [place].  Try the Ozempic first at 0.25 mg weekly, to make sure you don't have have stomach problems.  Take Basaglar just 40 units each morning Please come back for a follow-up appointment in 2 months, in person if possible.

## 2019-08-16 NOTE — Patient Instructions (Signed)
I have sent 2 prescriptions to your pharmacy: to change Lantus to [place], and to start [place].  Try the Ozempic first at 0.25 mg weekly, to make sure you don't have have stomach problems.  Take Basaglar just 40 units each morning Please come back for a follow-up appointment in 2 months, in person if possible.

## 2019-09-02 DIAGNOSIS — E785 Hyperlipidemia, unspecified: Secondary | ICD-10-CM | POA: Diagnosis not present

## 2019-09-02 DIAGNOSIS — I5020 Unspecified systolic (congestive) heart failure: Secondary | ICD-10-CM | POA: Diagnosis not present

## 2019-09-02 DIAGNOSIS — I5022 Chronic systolic (congestive) heart failure: Secondary | ICD-10-CM | POA: Diagnosis not present

## 2019-09-02 DIAGNOSIS — I480 Paroxysmal atrial fibrillation: Secondary | ICD-10-CM | POA: Diagnosis not present

## 2019-09-02 DIAGNOSIS — I447 Left bundle-branch block, unspecified: Secondary | ICD-10-CM | POA: Diagnosis not present

## 2019-09-02 DIAGNOSIS — I4891 Unspecified atrial fibrillation: Secondary | ICD-10-CM | POA: Diagnosis not present

## 2019-09-02 DIAGNOSIS — E669 Obesity, unspecified: Secondary | ICD-10-CM | POA: Diagnosis not present

## 2019-09-02 DIAGNOSIS — Z95810 Presence of automatic (implantable) cardiac defibrillator: Secondary | ICD-10-CM | POA: Diagnosis not present

## 2019-09-02 DIAGNOSIS — F419 Anxiety disorder, unspecified: Secondary | ICD-10-CM | POA: Diagnosis not present

## 2019-09-02 DIAGNOSIS — R197 Diarrhea, unspecified: Secondary | ICD-10-CM | POA: Diagnosis not present

## 2019-09-02 DIAGNOSIS — F411 Generalized anxiety disorder: Secondary | ICD-10-CM | POA: Diagnosis not present

## 2019-09-02 DIAGNOSIS — K529 Noninfective gastroenteritis and colitis, unspecified: Secondary | ICD-10-CM | POA: Diagnosis not present

## 2019-09-02 DIAGNOSIS — J90 Pleural effusion, not elsewhere classified: Secondary | ICD-10-CM | POA: Diagnosis not present

## 2019-09-02 DIAGNOSIS — F3181 Bipolar II disorder: Secondary | ICD-10-CM | POA: Diagnosis not present

## 2019-09-02 DIAGNOSIS — I255 Ischemic cardiomyopathy: Secondary | ICD-10-CM | POA: Diagnosis not present

## 2019-09-02 DIAGNOSIS — R05 Cough: Secondary | ICD-10-CM | POA: Diagnosis not present

## 2019-09-02 DIAGNOSIS — I248 Other forms of acute ischemic heart disease: Secondary | ICD-10-CM | POA: Diagnosis not present

## 2019-09-02 DIAGNOSIS — E119 Type 2 diabetes mellitus without complications: Secondary | ICD-10-CM | POA: Diagnosis not present

## 2019-09-02 DIAGNOSIS — K219 Gastro-esophageal reflux disease without esophagitis: Secondary | ICD-10-CM | POA: Diagnosis not present

## 2019-09-02 DIAGNOSIS — Z7901 Long term (current) use of anticoagulants: Secondary | ICD-10-CM | POA: Diagnosis not present

## 2019-09-02 DIAGNOSIS — Z20822 Contact with and (suspected) exposure to covid-19: Secondary | ICD-10-CM | POA: Diagnosis not present

## 2019-09-02 DIAGNOSIS — I2510 Atherosclerotic heart disease of native coronary artery without angina pectoris: Secondary | ICD-10-CM | POA: Diagnosis not present

## 2019-09-02 DIAGNOSIS — Z794 Long term (current) use of insulin: Secondary | ICD-10-CM | POA: Diagnosis not present

## 2019-09-02 DIAGNOSIS — I110 Hypertensive heart disease with heart failure: Secondary | ICD-10-CM | POA: Diagnosis not present

## 2019-09-02 DIAGNOSIS — E781 Pure hyperglyceridemia: Secondary | ICD-10-CM | POA: Diagnosis not present

## 2019-09-02 DIAGNOSIS — R0989 Other specified symptoms and signs involving the circulatory and respiratory systems: Secondary | ICD-10-CM | POA: Diagnosis not present

## 2019-09-09 ENCOUNTER — Other Ambulatory Visit (HOSPITAL_COMMUNITY): Payer: Self-pay | Admitting: Psychiatry

## 2019-09-09 ENCOUNTER — Other Ambulatory Visit (HOSPITAL_COMMUNITY): Payer: Self-pay | Admitting: Cardiology

## 2019-09-09 DIAGNOSIS — I509 Heart failure, unspecified: Secondary | ICD-10-CM | POA: Diagnosis not present

## 2019-09-09 DIAGNOSIS — E1165 Type 2 diabetes mellitus with hyperglycemia: Secondary | ICD-10-CM | POA: Diagnosis not present

## 2019-09-09 DIAGNOSIS — I480 Paroxysmal atrial fibrillation: Secondary | ICD-10-CM | POA: Diagnosis not present

## 2019-09-09 DIAGNOSIS — N289 Disorder of kidney and ureter, unspecified: Secondary | ICD-10-CM | POA: Diagnosis not present

## 2019-09-09 DIAGNOSIS — Z09 Encounter for follow-up examination after completed treatment for conditions other than malignant neoplasm: Secondary | ICD-10-CM | POA: Diagnosis not present

## 2019-09-09 DIAGNOSIS — I447 Left bundle-branch block, unspecified: Secondary | ICD-10-CM | POA: Diagnosis not present

## 2019-09-09 DIAGNOSIS — R000 Tachycardia, unspecified: Secondary | ICD-10-CM | POA: Diagnosis not present

## 2019-09-14 DIAGNOSIS — Z0000 Encounter for general adult medical examination without abnormal findings: Secondary | ICD-10-CM | POA: Diagnosis not present

## 2019-09-14 DIAGNOSIS — N289 Disorder of kidney and ureter, unspecified: Secondary | ICD-10-CM | POA: Diagnosis not present

## 2019-09-14 DIAGNOSIS — Z125 Encounter for screening for malignant neoplasm of prostate: Secondary | ICD-10-CM | POA: Diagnosis not present

## 2019-09-14 DIAGNOSIS — E785 Hyperlipidemia, unspecified: Secondary | ICD-10-CM | POA: Diagnosis not present

## 2019-09-17 ENCOUNTER — Ambulatory Visit (INDEPENDENT_AMBULATORY_CARE_PROVIDER_SITE_OTHER): Payer: BC Managed Care – PPO | Admitting: *Deleted

## 2019-09-17 DIAGNOSIS — I255 Ischemic cardiomyopathy: Secondary | ICD-10-CM | POA: Diagnosis not present

## 2019-09-17 LAB — CUP PACEART REMOTE DEVICE CHECK
Battery Remaining Longevity: 78 mo
Battery Remaining Percentage: 73 %
Battery Voltage: 2.98 V
Brady Statistic RV Percent Paced: 1 %
Date Time Interrogation Session: 20210820053606
HighPow Impedance: 69 Ohm
HighPow Impedance: 69 Ohm
Implantable Lead Implant Date: 20090817
Implantable Lead Location: 753860
Implantable Lead Model: 6935
Implantable Pulse Generator Implant Date: 20180419
Lead Channel Impedance Value: 600 Ohm
Lead Channel Pacing Threshold Amplitude: 0.75 V
Lead Channel Pacing Threshold Pulse Width: 0.5 ms
Lead Channel Sensing Intrinsic Amplitude: 12 mV
Lead Channel Setting Pacing Amplitude: 2.5 V
Lead Channel Setting Pacing Pulse Width: 0.5 ms
Lead Channel Setting Sensing Sensitivity: 0.5 mV
Pulse Gen Serial Number: 7411315

## 2019-09-20 NOTE — Progress Notes (Signed)
Remote ICD transmission.   

## 2019-09-22 ENCOUNTER — Other Ambulatory Visit: Payer: Self-pay | Admitting: Internal Medicine

## 2019-09-27 DIAGNOSIS — E1165 Type 2 diabetes mellitus with hyperglycemia: Secondary | ICD-10-CM | POA: Diagnosis not present

## 2019-09-27 DIAGNOSIS — R1110 Vomiting, unspecified: Secondary | ICD-10-CM | POA: Diagnosis not present

## 2019-10-01 DIAGNOSIS — R1110 Vomiting, unspecified: Secondary | ICD-10-CM | POA: Diagnosis not present

## 2019-10-07 ENCOUNTER — Other Ambulatory Visit: Payer: Self-pay | Admitting: Gastroenterology

## 2019-10-07 DIAGNOSIS — Z86010 Personal history of colonic polyps: Secondary | ICD-10-CM | POA: Diagnosis not present

## 2019-10-07 DIAGNOSIS — Z7901 Long term (current) use of anticoagulants: Secondary | ICD-10-CM | POA: Diagnosis not present

## 2019-10-07 DIAGNOSIS — R112 Nausea with vomiting, unspecified: Secondary | ICD-10-CM | POA: Diagnosis not present

## 2019-10-11 ENCOUNTER — Telehealth (HOSPITAL_COMMUNITY): Payer: Self-pay | Admitting: Cardiology

## 2019-10-11 NOTE — Telephone Encounter (Signed)
Medical clearance completed by Dr [facility] Patient is approved to proceed with endoscopy  from a cardiac standpoint on 11/03/19. Medication prep- hold eliquis 2 days prior to procedure   Faxed to [facility] GI Attn Dr Paulita Fujita Fax # [phone]

## 2019-10-23 ENCOUNTER — Other Ambulatory Visit (HOSPITAL_COMMUNITY): Payer: Self-pay | Admitting: Psychiatry

## 2019-11-02 ENCOUNTER — Inpatient Hospital Stay (HOSPITAL_COMMUNITY): Admission: RE | Admit: 2019-11-02 | Payer: BC Managed Care – PPO | Source: Ambulatory Visit

## 2019-11-02 ENCOUNTER — Other Ambulatory Visit: Payer: Self-pay | Admitting: Gastroenterology

## 2019-11-02 NOTE — Progress Notes (Signed)
Called patient regarding his COVID test. Pt states he will need to cancel because he is unable to go get his test complete in time due to car issues and just making it back into [place]. Pt informed that he would need to call Dr. Erlinda Hong office to reschedule. Contact information given. Pt verbalizes understanding.

## 2019-11-03 ENCOUNTER — Ambulatory Visit (HOSPITAL_COMMUNITY)
Admission: RE | Admit: 2019-11-03 | Payer: BC Managed Care – PPO | Source: Home / Self Care | Admitting: Gastroenterology

## 2019-11-03 SURGERY — ESOPHAGOGASTRODUODENOSCOPY (EGD) WITH PROPOFOL
Anesthesia: Monitor Anesthesia Care

## 2019-11-06 ENCOUNTER — Other Ambulatory Visit (HOSPITAL_COMMUNITY): Payer: Self-pay | Admitting: Psychiatry

## 2019-11-08 ENCOUNTER — Other Ambulatory Visit: Payer: Self-pay | Admitting: Cardiology

## 2019-11-25 ENCOUNTER — Telehealth: Payer: Self-pay | Admitting: Endocrinology

## 2019-11-25 NOTE — Telephone Encounter (Signed)
D/c Ozempic Resume insulin Needs f/u next available

## 2019-11-25 NOTE — Telephone Encounter (Signed)
Pt stated--vomiting, stomach pain from the [place] 3 weeks and requesting for Lantus. Pt stated have not taking insulin for the past 3 weeks.

## 2019-11-25 NOTE — Telephone Encounter (Signed)
Pt requesting to send the medication to [facility]- fax [phone]

## 2019-11-25 NOTE — Telephone Encounter (Signed)
Patient called stating Corey Palmer has made him feel sick and he would like to switch to the Lantus - please call in to   [id number] - [place], [address]  [address] [place] [address]  Phone:  [phone] Fax:  [phone]

## 2019-11-27 ENCOUNTER — Other Ambulatory Visit (HOSPITAL_COMMUNITY)
Admission: RE | Admit: 2019-11-27 | Discharge: 2019-11-27 | Disposition: A | Discharge status: Home / Self Care | Payer: BC Managed Care – PPO | Source: Ambulatory Visit | Attending: Gastroenterology | Admitting: Gastroenterology

## 2019-11-27 DIAGNOSIS — Z20822 Contact with and (suspected) exposure to covid-19: Secondary | ICD-10-CM | POA: Diagnosis not present

## 2019-11-27 DIAGNOSIS — Z01812 Encounter for preprocedural laboratory examination: Secondary | ICD-10-CM | POA: Insufficient documentation

## 2019-11-28 LAB — SARS CORONAVIRUS 2 (TAT 6-24 HRS): SARS Coronavirus 2: NEGATIVE

## 2019-11-29 NOTE — Telephone Encounter (Signed)
Spoke with patient and appointment scheduled 

## 2019-11-30 NOTE — Anesthesia Preprocedure Evaluation (Addendum)
Anesthesia Evaluation  Patient identified by MRN, date of birth, ID band Patient awake    Reviewed: Allergy & Precautions, NPO status , Patient's Chart, lab work & pertinent test results  Airway Mallampati: II  TM Distance: <3 FB Neck ROM: Full    Dental no notable dental hx.    Pulmonary sleep apnea ,    Pulmonary exam normal breath sounds clear to auscultation       Cardiovascular hypertension, + CAD, + CABG and +CHF  Normal cardiovascular exam+ dysrhythmias Atrial Fibrillation + Cardiac Defibrillator  Rhythm:Regular Rate:Normal  EF 20-25%   Neuro/Psych negative neurological ROS  negative psych ROS   GI/Hepatic Neg liver ROS, GERD  ,  Endo/Other  diabetes  Renal/GU negative Renal ROS  negative genitourinary   Musculoskeletal negative musculoskeletal ROS (+)   Abdominal   Peds negative pediatric ROS (+)  Hematology negative hematology ROS (+)   Anesthesia Other Findings   Reproductive/Obstetrics negative OB ROS                            Anesthesia Physical Anesthesia Plan  ASA: IV  Anesthesia Plan: MAC   Post-op Pain Management:    Induction: Intravenous  PONV Risk Score and Plan: 0  Airway Management Planned: Simple Face Mask  Additional Equipment:   Intra-op Plan:   Post-operative Plan:   Informed Consent: I have reviewed the patients History and Physical, chart, labs and discussed the procedure including the risks, benefits and alternatives for the proposed anesthesia with the patient or authorized representative who has indicated his/her understanding and acceptance.     Dental advisory given  Plan Discussed with: CRNA and Surgeon  Anesthesia Plan Comments:        Anesthesia Quick Evaluation

## 2019-12-01 ENCOUNTER — Ambulatory Visit (HOSPITAL_COMMUNITY): Payer: BC Managed Care – PPO | Admitting: Anesthesiology

## 2019-12-01 ENCOUNTER — Encounter: Payer: Self-pay | Admitting: Endocrinology

## 2019-12-01 ENCOUNTER — Ambulatory Visit (HOSPITAL_COMMUNITY)
Admission: RE | Admit: 2019-12-01 | Discharge: 2019-12-01 | Disposition: A | Discharge status: Home / Self Care | Payer: BC Managed Care – PPO | Attending: Gastroenterology | Admitting: Gastroenterology

## 2019-12-01 ENCOUNTER — Other Ambulatory Visit: Payer: Self-pay

## 2019-12-01 ENCOUNTER — Ambulatory Visit (INDEPENDENT_AMBULATORY_CARE_PROVIDER_SITE_OTHER): Payer: BC Managed Care – PPO | Admitting: Endocrinology

## 2019-12-01 ENCOUNTER — Encounter (HOSPITAL_COMMUNITY)
Admission: RE | Disposition: A | Discharge status: Home / Self Care | Payer: Self-pay | Source: Home / Self Care | Attending: Gastroenterology

## 2019-12-01 VITALS — BP 142/90 | HR 94 | Ht 75.0 in | Wt 261.0 lb

## 2019-12-01 DIAGNOSIS — K2970 Gastritis, unspecified, without bleeding: Secondary | ICD-10-CM | POA: Insufficient documentation

## 2019-12-01 DIAGNOSIS — Z880 Allergy status to penicillin: Secondary | ICD-10-CM | POA: Diagnosis not present

## 2019-12-01 DIAGNOSIS — I110 Hypertensive heart disease with heart failure: Secondary | ICD-10-CM | POA: Diagnosis not present

## 2019-12-01 DIAGNOSIS — Z79899 Other long term (current) drug therapy: Secondary | ICD-10-CM | POA: Diagnosis not present

## 2019-12-01 DIAGNOSIS — K319 Disease of stomach and duodenum, unspecified: Secondary | ICD-10-CM | POA: Diagnosis not present

## 2019-12-01 DIAGNOSIS — Z8249 Family history of ischemic heart disease and other diseases of the circulatory system: Secondary | ICD-10-CM | POA: Insufficient documentation

## 2019-12-01 DIAGNOSIS — Z888 Allergy status to other drugs, medicaments and biological substances status: Secondary | ICD-10-CM | POA: Insufficient documentation

## 2019-12-01 DIAGNOSIS — Z883 Allergy status to other anti-infective agents status: Secondary | ICD-10-CM | POA: Insufficient documentation

## 2019-12-01 DIAGNOSIS — K3189 Other diseases of stomach and duodenum: Secondary | ICD-10-CM | POA: Diagnosis not present

## 2019-12-01 DIAGNOSIS — Z7722 Contact with and (suspected) exposure to environmental tobacco smoke (acute) (chronic): Secondary | ICD-10-CM | POA: Diagnosis not present

## 2019-12-01 DIAGNOSIS — Z955 Presence of coronary angioplasty implant and graft: Secondary | ICD-10-CM | POA: Insufficient documentation

## 2019-12-01 DIAGNOSIS — I5043 Acute on chronic combined systolic (congestive) and diastolic (congestive) heart failure: Secondary | ICD-10-CM | POA: Diagnosis not present

## 2019-12-01 DIAGNOSIS — E1165 Type 2 diabetes mellitus with hyperglycemia: Secondary | ICD-10-CM | POA: Diagnosis not present

## 2019-12-01 DIAGNOSIS — Z794 Long term (current) use of insulin: Secondary | ICD-10-CM | POA: Insufficient documentation

## 2019-12-01 DIAGNOSIS — R112 Nausea with vomiting, unspecified: Secondary | ICD-10-CM | POA: Insufficient documentation

## 2019-12-01 DIAGNOSIS — Z95811 Presence of heart assist device: Secondary | ICD-10-CM | POA: Insufficient documentation

## 2019-12-01 DIAGNOSIS — I2510 Atherosclerotic heart disease of native coronary artery without angina pectoris: Secondary | ICD-10-CM | POA: Diagnosis not present

## 2019-12-01 HISTORY — PX: ESOPHAGOGASTRODUODENOSCOPY (EGD) WITH PROPOFOL: SHX5813

## 2019-12-01 HISTORY — PX: BIOPSY: SHX5522

## 2019-12-01 LAB — POCT GLYCOSYLATED HEMOGLOBIN (HGB A1C): Hemoglobin A1C: 10.8 % — AB (ref 4.0–5.6)

## 2019-12-01 LAB — GLUCOSE, CAPILLARY: Glucose-Capillary: 237 mg/dL — ABNORMAL HIGH (ref 70–99)

## 2019-12-01 SURGERY — ESOPHAGOGASTRODUODENOSCOPY (EGD) WITH PROPOFOL
Anesthesia: Monitor Anesthesia Care

## 2019-12-01 MED ORDER — PROPOFOL 1000 MG/100ML IV EMUL
INTRAVENOUS | Status: AC
  Filled 2019-12-01: qty 100

## 2019-12-01 MED ORDER — PROPOFOL 500 MG/50ML IV EMUL
INTRAVENOUS | Status: DC | PRN
  Administered 2019-12-01: 300 ug/kg/min via INTRAVENOUS

## 2019-12-01 MED ORDER — LACTATED RINGERS IV SOLN: INTRAVENOUS | Status: DC

## 2019-12-01 MED ORDER — ONDANSETRON HCL 4 MG/2ML IJ SOLN
4.0000 mg | Freq: Once | INTRAMUSCULAR | Status: AC
  Administered 2019-12-01: 4 mg via INTRAVENOUS

## 2019-12-01 MED ORDER — PROPOFOL 500 MG/50ML IV EMUL
INTRAVENOUS | Status: AC
  Filled 2019-12-01: qty 200

## 2019-12-01 MED ORDER — ONDANSETRON HCL 4 MG/2ML IJ SOLN
INTRAMUSCULAR | Status: AC
  Filled 2019-12-01: qty 2

## 2019-12-01 MED ORDER — SODIUM CHLORIDE 0.9 % IV SOLN: INTRAVENOUS | Status: DC

## 2019-12-01 MED ORDER — APIXABAN 5 MG PO TABS: 5.0000 mg | ORAL_TABLET | Freq: Two times a day (BID) | ORAL | 3 refills | Status: AC

## 2019-12-01 MED ORDER — LANTUS SOLOSTAR 100 UNIT/ML ~~LOC~~ SOPN: 40.0000 [IU] | PEN_INJECTOR | SUBCUTANEOUS | 99 refills | Status: AC

## 2019-12-01 MED ORDER — OZEMPIC (0.25 OR 0.5 MG/DOSE) 2 MG/1.5ML ~~LOC~~ SOPN: 0.5000 mg | PEN_INJECTOR | SUBCUTANEOUS | 3 refills | Status: AC

## 2019-12-01 SURGICAL SUPPLY — 14 items

## 2019-12-01 NOTE — Anesthesia Postprocedure Evaluation (Signed)
Anesthesia Post Note  Patient: DANTAVIOUS SNOWBALL  Procedure(s) Performed: ESOPHAGOGASTRODUODENOSCOPY (EGD) WITH PROPOFOL (N/A ) BIOPSY     Patient location during evaluation: PACU Anesthesia Type: MAC Level of consciousness: awake and alert Pain management: pain level controlled Vital Signs Assessment: post-procedure vital signs reviewed and stable Respiratory status: spontaneous breathing, nonlabored ventilation, respiratory function stable and patient connected to nasal cannula oxygen Cardiovascular status: stable and blood pressure returned to baseline Postop Assessment: no apparent nausea or vomiting Anesthetic complications: no   No complications documented.  Last Vitals:  Vitals:   12/01/19 0910 12/01/19 0925  BP: 114/74 111/87  Pulse: 79 75  Resp: 15 17  Temp:    SpO2: 96% 95%    Last Pain:  Vitals:   12/01/19 0925  TempSrc:   PainSc: 0-No pain                 Juanmiguel Defelice S

## 2019-12-01 NOTE — Op Note (Signed)
[facility] Patient Name: Corey Palmer Procedure Date: 12/01/2019 MRN: [id number] Attending MD: Arta Silence , MD Date of Birth: 12-21-1970 CSN: [id number] Age: 49 Admit Type: Outpatient Procedure:                Upper GI endoscopy Indications:              Nausea with vomiting Providers:                Arta Silence, MD, Cleda Daub, RN, Laverda Sorenson, Technician, Barrett Henle, CRNA Referring MD:             London Pepper, MD Medicines:                Monitored Anesthesia Care Complications:            No immediate complications. Estimated Blood Loss:     Estimated blood loss was minimal. Procedure:                Pre-Anesthesia Assessment:                           - Prior to the procedure, a History and Physical                            was performed, and patient medications and                            allergies were reviewed. [name] tolerance of                            previous anesthesia was also reviewed. The risks                            and benefits of the procedure and the sedation                            options and risks were discussed with the patient.                            All questions were answered, and informed consent                            was obtained. Prior Anticoagulants: The patient has                            taken Eliquis (apixaban), last dose was 3 days                            prior to procedure. ASA Grade Assessment: IV - A                            patient with severe systemic disease that is a  constant threat to life. After reviewing the risks                            and benefits, the patient was deemed in                            satisfactory condition to undergo the procedure.                           After obtaining informed consent, the endoscope was                            passed under direct vision. Throughout the                             procedure, the patient's blood pressure, pulse, and                            oxygen saturations were monitored continuously. The                            GIF-H190 ([id number] Olympus gastroscope was                            introduced through the mouth, and advanced to the                            second part of duodenum. The upper GI endoscopy was                            accomplished without difficulty. The patient                            tolerated the procedure well. Scope In: Scope Out: Findings:      The examined esophagus was normal.      Patchy moderate inflammation characterized by congestion (edema),       erosions and erythema was found in the gastric fundus, in the gastric       body and in the gastric antrum. Biopsies were taken with a cold forceps       for histology.      The exam of the stomach was otherwise normal.      The duodenal bulb, first portion of the duodenum and second portion of       the duodenum were normal. Impression:               - Normal esophagus.                           - Gastritis. Biopsied.                           - Normal duodenal bulb, first portion of the                            duodenum and second portion of  the duodenum. Moderate Sedation:      None Recommendation:           - Patient has a contact number available for                            emergencies. The signs and symptoms of potential                            delayed complications were discussed with the                            patient. Return to normal activities tomorrow.                            Written discharge instructions were provided to the                            patient.                           - Discharge patient to home (via wheelchair).                           - Resume previous diet today.                           - Increase Prilosec (omeprazole) to 40 mg PO BID                            until further notice.                            - Resume Eliquis (apixaban) at prior dose tomorrow.                           - Await pathology results.                           - Return to GI clinic after studies are complete.                           - Return to referring physician as previously                            scheduled. Procedure Code(s):        --- Professional ---                           [phone], Esophagogastroduodenoscopy, flexible,                            transoral; with biopsy, single or multiple Diagnosis Code(s):        --- Professional ---                           K29.70, Gastritis, unspecified, without bleeding  R11.2, Nausea with vomiting, unspecified CPT copyright 2019 American Medical Association. All rights reserved. The codes documented in this report are preliminary and upon coder review may  be revised to meet current compliance requirements. Arta Silence, MD 12/01/2019 9:02:52 AM This report has been signed electronically. Number of Addenda: 0

## 2019-12-01 NOTE — Patient Instructions (Addendum)
Your blood pressure is high today.  Please see your primary care provider soon, to have it rechecked. I have sent a prescription to your pharmacy, to increase the Ozempic.   We are doing the prior auth for Lantus.   check your blood sugar twice a day.  vary the time of day when you check, between before the 3 meals, and at bedtime.  also check if you have symptoms of your blood sugar being too high or too low.  please keep a record of the readings and bring it to your next appointment here (or you can bring the meter itself).  You can write it on any piece of paper.  please call us sooner if your blood sugar goes below 70, or if you have a lot of readings over 200. please come back for a follow-up appointment in 3 months.

## 2019-12-01 NOTE — H&P (Signed)
Patient interval history reviewed.  Patient examined again.  There has been no change from documented H/P dated 11/30/19 scanned into chart from our office except as documented above.  Assessment:  1.  Nausea and vomiting.  Plan:  1.  Endoscopy. 2.  Risks (bleeding, infection, bowel perforation that could require surgery, sedation-related changes in cardiopulmonary systems), benefits (identification and possible treatment of source of symptoms, exclusion of certain causes of symptoms), and alternatives (watchful waiting, radiographic imaging studies, empiric medical treatment) of upper endoscopy (EGD) were explained to patient/family in detail and patient wishes to proceed.

## 2019-12-01 NOTE — Transfer of Care (Signed)
Immediate Anesthesia Transfer of Care Note  Patient: Corey Palmer  Procedure(s) Performed: ESOPHAGOGASTRODUODENOSCOPY (EGD) WITH PROPOFOL (N/A ) BIOPSY  Patient Location: Endoscopy Unit  Anesthesia Type:MAC  Level of Consciousness: sedated and responds to stimulation  Airway & Oxygen Therapy: Patient Spontanous Breathing and Patient connected to nasal cannula oxygen  Post-op Assessment: Report given to RN, Post -op Vital signs reviewed and stable and Patient moving all extremities  Post vital signs: Reviewed and stable  Last Vitals:  Vitals Value Taken Time  BP 98/69 12/01/19 0903  Temp    Pulse 76 12/01/19 0905  Resp 17 12/01/19 0905  SpO2 95 % 12/01/19 0905  Vitals shown include unvalidated device data.  Last Pain:  Vitals:   12/01/19 0759  TempSrc: Axillary  PainSc: 0-No pain         Complications: No complications documented.

## 2019-12-01 NOTE — Progress Notes (Signed)
Subjective:    Patient ID: Corey Palmer, male    DOB: 1970/07/04, 49 y.o.   MRN: [id number]  HPI Pt returns for f/u of diabetes mellitus:  DM type: Insulin-requiring type 2.  Dx'ed: 9741 Complications: polyneuropathy, CAD, and renal insuff.   Therapy: insulin since 2011, Farxiga, and trulicity.   DKA: never Severe hypoglycemia: once (2015) Pancreatitis: never Pancreatic imaging: normal on 2013 CT SDOH: He stopped Trulicity, due to cost.   Other: ins declines farxiga; he declines continuous glucose monitor, due to cost; he works as heavy Office manager; he stopped Omnipod, due to cost; he takes QD insulin, after poor results with multiple daily injections; Trulicity caused bloating.   Interval history: Pt says ins no longer covers Lantus, so he has been out x 1 month.  He was changed to [place], but he stopped due to nausea.  It has since resolved.  Pt says cbg's are in the 200's.  pt states he feels well in general.   Past Medical History:  Diagnosis Date  . Anginal pain ([place])   . Anxiety   . Arthritis   . Automatic implantable cardioverter-defibrillator in situ 2007   Medtronic   . Bipolar disorder ([place])   . CAD (coronary artery disease)    Cardiac cath (08/19/2000) - Nonobstructive, 40% stenosis of LAD,.   . Cancer [facility]) 2013   benign stomach cancer  . CHF (congestive heart failure) ([place])   . Chondrocalcinosis of right knee 06/11/2013  . Chronic systolic heart failure ([place])    2D echo (63/8453) - Systolic function severely reduced., LV EF 20-25%. Akinesis of apical and anteroseptal mycoardium.  last 2D echo (11/2008 ), LV EF 25-30%, diffuse hypokinesis, and grade 1 diastolic dysfunction.  . Depression   . Diabetes uncomplicated adult-type II    on insulin therapy, a1c 12.9 in 01/2011  . Dilated cardiomyopathy ([place])    status post ICD placement in 2008 c/b tearing at the atrial junction resulting in tamponade and urgent thoracotomy  . Dyslipidemia   . Dysrhythmia   . GERD  (gastroesophageal reflux disease)   . Gout, unspecified   . Hepatic steatosis 2011   seen on ultrasound.   . History of kidney stones   . Hx of echocardiogram    Echo (04/2013):  Mild LVH, EF 20-25%, mild LAE, mild to mod RVE with mild reduced RVSF.  Marland Kitchen Hypertension   . Obesity (BMI 30.0-34.9)   . Pacemaker   . Paroxysmal atrial fibrillation (HCC)    on chronic coumadin, goal INR 2-3. status post direct current  . Peptic ulcer   . Shortness of breath   . Sleep apnea    does not have CPAP    Past Surgical History:  Procedure Laterality Date  . BIOPSY  12/01/2019   Procedure: BIOPSY;  Surgeon: Arta Silence, MD;  Location: [facility];  Service: Endoscopy;;  . CARDIAC CATHETERIZATION    . CARDIAC PACEMAKER PLACEMENT    . CHOLECYSTECTOMY    . COLONOSCOPY    . CORONARY ARTERY BYPASS GRAFT    . ESOPHAGOGASTRODUODENOSCOPY  04/05/2011   Procedure: ESOPHAGOGASTRODUODENOSCOPY (EGD);  Surgeon: Irene Shipper, MD;  Location: [facility] ENDOSCOPY;  Service: Endoscopy;  Laterality: N/A;  . ESOPHAGOGASTRODUODENOSCOPY (EGD) WITH PROPOFOL N/A 12/01/2019   Procedure: ESOPHAGOGASTRODUODENOSCOPY (EGD) WITH PROPOFOL;  Surgeon: Arta Silence, MD;  Location: [facility];  Service: Endoscopy;  Laterality: N/A;  . JOINT REPLACEMENT     left knee replacement  . KNEE ARTHROSCOPY Right 06/11/2013   Procedure:  RIGHT KNEE ARTHROSCOPY with chondroplasty;  Surgeon: Johnny Bridge, MD;  Location: [place];  Service: Orthopedics;  Laterality: Right;  . TOTAL KNEE ARTHROPLASTY Left     Social History   Socioeconomic History  . Marital status: Married    Spouse name: Not on file  . Number of children: 3  . Years of education: college  . Highest education level: Not on file  Occupational History  . Occupation: bar tender  Tobacco Use  . Smoking status: Never Smoker  . Smokeless tobacco: Never Used  Vaping Use  . Vaping Use: Never used  Substance and Sexual Activity  . Alcohol use: No  . Drug use: No  . Sexual  activity: Yes  Other Topics Concern  . Not on file  Social History Narrative   Lives in [place].   Studied sports medicine in college, got a BS - used to work with the [employer].         Social Determinants of Health   Financial Resource Strain:   . Difficulty of Paying Living Expenses: Not on file  Food Insecurity:   . Worried About Charity fundraiser in the Last Year: Not on file  . Ran Out of Food in the Last Year: Not on file  Transportation Needs:   . Lack of Transportation (Medical): Not on file  . Lack of Transportation (Non-Medical): Not on file  Physical Activity:   . Days of Exercise per Week: Not on file  . Minutes of Exercise per Session: Not on file  Stress:   . Feeling of Stress : Not on file  Social Connections:   . Frequency of Communication with Friends and Family: Not on file  . Frequency of Social Gatherings with Friends and Family: Not on file  . Attends Religious Services: Not on file  . Active Member of Clubs or Organizations: Not on file  . Attends Archivist Meetings: Not on file  . Marital Status: Not on file  Intimate Partner Violence:   . Fear of Current or Ex-Partner: Not on file  . Emotionally Abused: Not on file  . Physically Abused: Not on file  . Sexually Abused: Not on file    Current Outpatient Medications on File Prior to Visit  Medication Sig Dispense Refill  . apixaban (ELIQUIS) 5 MG TABS tablet Take 1 tablet (5 mg total) by mouth 2 (two) times daily. 180 tablet 3  . busPIRone (BUSPAR) 10 MG tablet Take 10 mg by mouth 3 (three) times daily.    . carvedilol (COREG) 25 MG tablet Take 1 tablet (25 mg total) by mouth 2 (two) times daily. Needs appt 180 tablet 3  . dapagliflozin propanediol (FARXIGA) 10 MG TABS tablet Take 10 mg by mouth daily before breakfast. 30 tablet 6  . digoxin (LANOXIN) 0.125 MG tablet Take 1 tablet (0.125 mg total) by mouth daily. 90 tablet 3  . divalproex (DEPAKOTE ER) 500 MG 24 hr tablet Take 1 tablet  (500 mg total) by mouth daily. (Patient taking differently: Take 500 mg by mouth at bedtime. ) 90 tablet 0  . FLUoxetine (PROZAC) 20 MG capsule Take 4 capsules (80 mg total) by mouth daily. 120 capsule 2  . furosemide (LASIX) 20 MG tablet TAKE 2 TABLETS (40 MG TOTAL) BY MOUTH IN THE MORNING AND 1 TABLET (20 MG TOTAL) EVERY EVENING. 90 tablet 1  . glucose blood (CONTOUR TEST) test strip 1 each by Other route 2 (two) times daily. And lancets 1/day 180  each 3  . hydrOXYzine (VISTARIL) 50 MG capsule Take 50 mg by mouth 3 (three) times daily as needed for anxiety.     . magnesium oxide (MAG-OX) 400 MG tablet Take 800 mg by mouth daily.     Marland Kitchen omeprazole (PRILOSEC) 40 MG capsule Take 1 capsule (40 mg total) by mouth daily. 30 capsule 3  . Potassium (POTASSIMIN PO) Take 400 mg by mouth daily.    . promethazine (PHENERGAN) 25 MG tablet Take 25 mg by mouth in the morning and at bedtime.     . sacubitril-valsartan (ENTRESTO) 49-51 MG Take 1 tablet by mouth 2 (two) times daily. 60 tablet 6  . spironolactone (ALDACTONE) 25 MG tablet Take 1 tablet (25 mg total) by mouth daily. Needs appt 90 tablet 3   No current facility-administered medications on file prior to visit.     Family History  Problem Relation Age of Onset  . Diabetes Mother   . Coronary artery disease Mother 39  . Gout Mother   . Heart disease Mother   . Heart attack Mother 36  . Diabetes Father   . Coronary artery disease Father 7       Died in a house fire   . Heart disease Father   . Heart disease Brother     BP (!) 142/90   Pulse 94   Ht 6\' 3"  (1.905 m)   Wt 261 lb (118.4 kg)   SpO2 96%   BMI 32.62 kg/m     Review of Systems He denies hypoglycemia    Objective:   Physical Exam VITAL SIGNS:  See vs page GENERAL: no distress Pulses: dorsalis pedis intact bilat.   MSK: no deformity of the feet CV: 2+ bilat leg edema Skin:  no ulcer on the feet.  normal color and temp on the feet. Neuro: sensation is intact to touch  on the feet. Ext: there is bilateral onychomycosis of the toenails   Lab Results  Component Value Date   HGBA1C 10.8 (A) 12/01/2019       Assessment & Plan:  HTN: is noted today Type 2 DM: uncontrolled Nausea: perceived due to [place].   Patient Instructions  Your blood pressure is high today.  Please see your primary care provider soon, to have it rechecked. I have sent a prescription to your pharmacy, to increase the Ozempic.   We are doing the prior auth for Lantus.   check your blood sugar twice a day.  vary the time of day when you check, between before the 3 meals, and at bedtime.  also check if you have symptoms of your blood sugar being too high or too low.  please keep a record of the readings and bring it to your next appointment here (or you can bring the meter itself).  You can write it on any piece of paper.  please call us sooner if your blood sugar goes below 70, or if you have a lot of readings over 200. please come back for a follow-up appointment in 3 months.

## 2019-12-01 NOTE — Discharge Instructions (Signed)

## 2019-12-02 ENCOUNTER — Other Ambulatory Visit: Payer: Self-pay

## 2019-12-02 ENCOUNTER — Encounter (HOSPITAL_COMMUNITY): Payer: Self-pay | Admitting: Gastroenterology

## 2019-12-02 LAB — SURGICAL PATHOLOGY

## 2019-12-03 NOTE — Progress Notes (Signed)
Your demographic data has been sent to [facility] successfully!  [employer] typically takes 5-10 minutes to respond, but it may take a little longer in some cases. You will be notified by email when available. You can also check for an update later by opening this request from your dashboard. Please do not fax or call [employer] to resubmit this request. If you need assistance, please chat with CoverMyMeds or call us at [phone].  If it has been longer than 24 hours, please reach out to [place].

## 2019-12-05 ENCOUNTER — Other Ambulatory Visit: Payer: Self-pay | Admitting: Cardiology

## 2019-12-06 ENCOUNTER — Other Ambulatory Visit: Payer: Self-pay

## 2019-12-06 ENCOUNTER — Telehealth (INDEPENDENT_AMBULATORY_CARE_PROVIDER_SITE_OTHER): Payer: BC Managed Care – PPO | Admitting: Psychiatry

## 2019-12-06 DIAGNOSIS — J069 Acute upper respiratory infection, unspecified: Secondary | ICD-10-CM | POA: Diagnosis not present

## 2019-12-06 DIAGNOSIS — F411 Generalized anxiety disorder: Secondary | ICD-10-CM | POA: Diagnosis not present

## 2019-12-06 DIAGNOSIS — F3181 Bipolar II disorder: Secondary | ICD-10-CM

## 2019-12-06 DIAGNOSIS — R1110 Vomiting, unspecified: Secondary | ICD-10-CM | POA: Diagnosis not present

## 2019-12-06 DIAGNOSIS — R059 Cough, unspecified: Secondary | ICD-10-CM | POA: Diagnosis not present

## 2019-12-06 MED ORDER — FLUOXETINE HCL 20 MG PO CAPS: 80.0000 mg | ORAL_CAPSULE | Freq: Every day | ORAL | 11 refills | Status: AC

## 2019-12-06 MED ORDER — BUSPIRONE HCL 10 MG PO TABS: 10.0000 mg | ORAL_TABLET | Freq: Three times a day (TID) | ORAL | 11 refills | Status: DC

## 2019-12-06 MED ORDER — HYDROXYZINE PAMOATE 50 MG PO CAPS: 50.0000 mg | ORAL_CAPSULE | Freq: Three times a day (TID) | ORAL | 11 refills | Status: DC | PRN

## 2019-12-06 MED ORDER — DIVALPROEX SODIUM ER 500 MG PO TB24: 500.0000 mg | ORAL_TABLET | Freq: Every day | ORAL | 3 refills | Status: AC

## 2019-12-06 NOTE — Progress Notes (Signed)
[facility] MD/PA/NP OP Progress Note  12/06/2019 11:13 AM Corey Palmer  MRN:  [id number] Interview was conducted by phone and I verified that I was speaking with the correct person using two identifiers. I discussed the limitations of evaluation and management by telemedicine and  the availability of in person appointments. Patient expressed understanding and agreed to proceed. Patient location - home; physician - home office.  Chief Complaint: Anxiety.  HPI: 49 yo married male with bipolar 2 disorder and generalized/panic type anxiety presents requesting refills on his medications as he has run out of them a week ago. He has not been seen in the clinic for a year and has a hx of missing follow up appointment previously. Corey Palmer reports feeling emotionally stable when taking his medications but has been more anxious since he run out. He typically takes Vistaril every day - once or twice but on occasion needs it more often. 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    Visit Diagnosis:    ICD-10-CM   1. GAD (generalized anxiety disorder)  F41.1   2. Bipolar 2 disorder (HCC)  F31.81     Past Psychiatric History: Please see intake H&P.  Past Medical History:  Past Medical History:  Diagnosis Date  . Anginal pain ([place])   . Anxiety   . Arthritis   . Automatic implantable cardioverter-defibrillator in situ 2007   Medtronic   . Bipolar disorder ([place])   . CAD (coronary artery disease)    Cardiac cath (08/19/2000) - Nonobstructive, 40% stenosis of LAD,.   . Cancer [facility]) 2013   benign stomach cancer  . CHF (congestive heart failure) ([place])   . Chondrocalcinosis of right knee 06/11/2013  . Chronic systolic heart failure ([place])    2D echo (74/1287) - Systolic function severely reduced., LV EF 20-25%. Akinesis of apical and anteroseptal mycoardium.  last 2D echo (11/2008 ), LV EF 25-30%, diffuse hypokinesis, and grade 1 diastolic dysfunction.  .  Depression   . Diabetes uncomplicated adult-type II    on insulin therapy, a1c 12.9 in 01/2011  . Dilated cardiomyopathy ([place])    status post ICD placement in 2008 c/b tearing at the atrial junction resulting in tamponade and urgent thoracotomy  . Dyslipidemia   . Dysrhythmia   . GERD (gastroesophageal reflux disease)   . Gout, unspecified   . Hepatic steatosis 2011   seen on ultrasound.   . History of kidney stones   . Hx of echocardiogram    Echo (04/2013):  Mild LVH, EF 20-25%, mild LAE, mild to mod RVE with mild reduced RVSF.  Marland Kitchen Hypertension   . Obesity (BMI 30.0-34.9)   . Pacemaker   . Paroxysmal atrial fibrillation (HCC)    on chronic coumadin, goal INR 2-3. status post direct current  . Peptic ulcer   . Shortness of breath   . Sleep apnea    does not have CPAP    Past Surgical History:  Procedure Laterality Date  . BIOPSY  12/01/2019   Procedure: BIOPSY;  Surgeon: Arta Silence, MD;  [facility];  Service: Endoscopy;;  . CARDIAC CATHETERIZATION    . CARDIAC PACEMAKER PLACEMENT    . CHOLECYSTECTOMY    . COLONOSCOPY    . CORONARY ARTERY BYPASS GRAFT    . ESOPHAGOGASTRODUODENOSCOPY  04/05/2011   Procedure: ESOPHAGOGASTRODUODENOSCOPY (EGD);  Surgeon: Irene Shipper, MD;  Location: [facility] ENDOSCOPY;  Service: Endoscopy;  Laterality: N/A;  . ESOPHAGOGASTRODUODENOSCOPY (  EGD) WITH PROPOFOL N/A 12/01/2019   Procedure: ESOPHAGOGASTRODUODENOSCOPY (EGD) WITH PROPOFOL;  Surgeon: Arta Silence, MD;  [facility];  Service: Endoscopy;  Laterality: N/A;  . JOINT REPLACEMENT     left knee replacement  . KNEE ARTHROSCOPY Right 06/11/2013   Procedure: RIGHT KNEE ARTHROSCOPY with chondroplasty;  Surgeon: Johnny Bridge, MD;  Location: [place];  Service: Orthopedics;  Laterality: Right;  . TOTAL KNEE ARTHROPLASTY Left     Family Psychiatric History: None.  Family History:  Family History  Problem Relation Age of Onset  . Diabetes Mother   . Coronary artery disease  Mother 67  . Gout Mother   . Heart disease Mother   . Heart attack Mother 48  . Diabetes Father   . Coronary artery disease Father 32       Died in a house fire   . Heart disease Father   . Heart disease Brother     Social History:  Social History   Socioeconomic History  . Marital status: Married    Spouse name: Not on file  . Number of children: 3  . Years of education: college  . Highest education level: Not on file  Occupational History  . Occupation: bar tender  Tobacco Use  . Smoking status: Never Smoker  . Smokeless tobacco: Never Used  Vaping Use  . Vaping Use: Never used  Substance and Sexual Activity  . Alcohol use: No  . Drug use: No  . Sexual activity: Yes  Other Topics Concern  . Not on file  Social History Narrative   Lives in [place].   Studied sports medicine in college, got a BS - used to work with the [employer].         Social Determinants of Health   Financial Resource Strain:   . Difficulty of Paying Living Expenses: Not on file  Food Insecurity:   . Worried About Charity fundraiser in the Last Year: Not on file  . Ran Out of Food in the Last Year: Not on file  Transportation Needs:   . Lack of Transportation (Medical): Not on file  . Lack of Transportation (Non-Medical): Not on file  Physical Activity:   . Days of Exercise per Week: Not on file  . Minutes of Exercise per Session: Not on file  Stress:   . Feeling of Stress : Not on file  Social Connections:   . Frequency of Communication with Friends and Family: Not on file  . Frequency of Social Gatherings with Friends and Family: Not on file  . Attends Religious Services: Not on file  . Active Member of Clubs or Organizations: Not on file  . Attends Archivist Meetings: Not on file  . Marital Status: Not on file    Allergies:  Allergies  Allergen Reactions  . Orange Fruit Anaphylaxis    Per Pt- Blister around lips and Hives all over   . Penicillins Anaphylaxis     Did it involve swelling of the face/tongue/throat, SOB, or low BP? Yes Did it involve sudden or severe rash/hives, skin peeling, or any reaction on the inside of your mouth or nose? Yes Did you need to seek medical attention at a hospital or doctor's office? No When did it last happen?childhood If all above answers are "NO", may proceed with cephalosporin use.  [name] [Empagliflozin] Itching  . [name] [Insulin Glargine] Nausea And Vomiting    Metabolic Disorder Labs: Lab Results  Component Value Date  HGBA1C 10.8 (A) 12/01/2019   MPG 294.83 10/01/2018   MPG 323.53 08/08/2017   No results found for: PROLACTIN Lab Results  Component Value Date   CHOL 176 10/01/2018   TRIG 126 10/01/2018   HDL 25 (L) 10/01/2018   CHOLHDL 7.0 10/01/2018   VLDL 25 10/01/2018   LDLCALC 126 (H) 10/01/2018   LDLCALC 74 03/29/2013   Lab Results  Component Value Date   TSH 3.009 10/01/2018   TSH 2.489 10/15/2011    Therapeutic Level Labs: No results found for: LITHIUM Lab Results  Component Value Date   VALPROATE 24 (L) 03/23/2018   No components found for:  CBMZ  Current Medications: Current Outpatient Medications  Medication Sig Dispense Refill  . apixaban (ELIQUIS) 5 MG TABS tablet Take 1 tablet (5 mg total) by mouth 2 (two) times daily. 180 tablet 3  . busPIRone (BUSPAR) 10 MG tablet Take 1 tablet (10 mg total) by mouth 3 (three) times daily. 90 tablet 11  . carvedilol (COREG) 25 MG tablet Take 1 tablet (25 mg total) by mouth 2 (two) times daily. Needs appt 180 tablet 3  . dapagliflozin propanediol (FARXIGA) 10 MG TABS tablet Take 10 mg by mouth daily before breakfast. 30 tablet 6  . digoxin (LANOXIN) 0.125 MG tablet Take 1 tablet (0.125 mg total) by mouth daily. 90 tablet 3  . divalproex (DEPAKOTE ER) 500 MG 24 hr tablet Take 1 tablet (500 mg total) by mouth at bedtime. 90 tablet 3  . FLUoxetine (PROZAC) 20 MG capsule Take 4 capsules (80 mg total) by mouth daily. 120  capsule 11  . furosemide (LASIX) 20 MG tablet TAKE 2 TABLETS (40 MG TOTAL) BY MOUTH IN THE MORNING AND 1 TABLET (20 MG TOTAL) EVERY EVENING. 90 tablet 1  . glucose blood (CONTOUR TEST) test strip 1 each by Other route 2 (two) times daily. And lancets 1/day 180 each 3  . hydrOXYzine (VISTARIL) 50 MG capsule Take 1 capsule (50 mg total) by mouth 3 (three) times daily as needed for anxiety. 90 capsule 11  . insulin glargine (LANTUS SOLOSTAR) 100 UNIT/ML Solostar Palmer Inject 40 Units into the skin every morning. 15 mL PRN  . magnesium oxide (MAG-OX) 400 MG tablet Take 800 mg by mouth daily.     Marland Kitchen omeprazole (PRILOSEC) 40 MG capsule Take 1 capsule (40 mg total) by mouth daily. 30 capsule 3  . Potassium (POTASSIMIN PO) Take 400 mg by mouth daily.    . promethazine (PHENERGAN) 25 MG tablet Take 25 mg by mouth in the morning and at bedtime.     . sacubitril-valsartan (ENTRESTO) 49-51 MG Take 1 tablet by mouth 2 (two) times daily. 60 tablet 6  . Semaglutide,0.25 or 0.5MG /DOS, (OZEMPIC, 0.25 OR 0.5 MG/DOSE,) 2 MG/1.5ML SOPN Inject 0.5 mg into the skin once a week. 4.5 mL 3  . spironolactone (ALDACTONE) 25 MG tablet Take 1 tablet (25 mg total) by mouth daily. Needs appt 90 tablet 3   No current facility-administered medications for this visit.     Psychiatric Specialty Exam: Review of Systems  Psychiatric/Behavioral: The patient is nervous/anxious.   All other systems reviewed and are negative.   There were no vitals taken for this visit.There is no height or weight on file to calculate BMI.  General Appearance: NA  Eye Contact:  NA  Speech:  Clear and Coherent and Normal Rate  Volume:  Normal  Mood:  Anxious  Affect:  NA  Thought Process:  Goal Directed and Linear  Orientation:  Full (Time, Place, and Person)  Thought Content: Logical   Suicidal Thoughts:  No  Homicidal Thoughts:  No  Memory:  Immediate;   Good Recent;   Good Remote;   Good  Judgement:  Fair  Insight:  Fair  Psychomotor  Activity:  NA  Concentration:  Concentration: Good  Recall:  Good  Fund of Knowledge: Good  Language: Good  Akathisia:  Negative  Handed:  Right  AIMS (if indicated): not done  Assets:  Communication Skills Desire for Improvement Financial Resources/Insurance Housing Social Support Talents/Skills  ADL's:  Intact  Cognition: WNL  Sleep:  Good   Screenings: PHQ2-9     Office Visit from 03/04/2011 in [place] Office Visit from 01/07/2011 in [place] Office Visit from 08/03/2010 in [place]  PHQ-2 Total Score 0 0 0       Assessment and Plan: 49 yo married male with bipolar 2 disorder and generalized/panic type anxiety presents requesting refills on his medications as he has run out of them a week ago. He has not been seen in the clinic for a year and has a hx of missing follow up appointment previously. Corey Palmer reports feeling emotionally stable when taking his medications but has been more anxious since he run out. He typically takes Vistaril every day - once or twice but on occasion needs it more often. 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  Dx: GAD; Bipolar 2 disorder  Plan: Renew Rx for Depakote ER 500 mg dose, Prozac 80 mg daily, Buspar 10 mg tid and hydroxyzine 50 mg tid prn anxiety. Next med-management visit in 3 months. The plan was discussed with patient who had an opportunity to ask questions and these were all answered. I spend 15 minutes in phone consultation with the patient.   Stephanie Acre, MD 12/06/2019, 11:13 AM

## 2019-12-22 ENCOUNTER — Other Ambulatory Visit (HOSPITAL_COMMUNITY): Payer: Self-pay | Admitting: Physician Assistant

## 2019-12-22 ENCOUNTER — Other Ambulatory Visit: Payer: Self-pay | Admitting: Physician Assistant

## 2019-12-22 DIAGNOSIS — R1010 Upper abdominal pain, unspecified: Secondary | ICD-10-CM | POA: Diagnosis not present

## 2019-12-22 DIAGNOSIS — R112 Nausea with vomiting, unspecified: Secondary | ICD-10-CM

## 2019-12-22 DIAGNOSIS — K2900 Acute gastritis without bleeding: Secondary | ICD-10-CM | POA: Diagnosis not present

## 2019-12-29 ENCOUNTER — Emergency Department (HOSPITAL_COMMUNITY): Payer: BC Managed Care – PPO

## 2019-12-29 ENCOUNTER — Other Ambulatory Visit (HOSPITAL_COMMUNITY): Payer: Self-pay

## 2019-12-29 ENCOUNTER — Inpatient Hospital Stay (HOSPITAL_COMMUNITY)
Admission: EM | Admit: 2019-12-29 | Discharge: 2020-03-28 | DRG: 001 | Disposition: E | Payer: BC Managed Care – PPO | Attending: Cardiothoracic Surgery | Admitting: Cardiothoracic Surgery

## 2019-12-29 ENCOUNTER — Other Ambulatory Visit: Payer: Self-pay

## 2019-12-29 DIAGNOSIS — R6521 Severe sepsis with septic shock: Secondary | ICD-10-CM | POA: Diagnosis not present

## 2019-12-29 DIAGNOSIS — I2510 Atherosclerotic heart disease of native coronary artery without angina pectoris: Secondary | ICD-10-CM | POA: Diagnosis not present

## 2019-12-29 DIAGNOSIS — E440 Moderate protein-calorie malnutrition: Secondary | ICD-10-CM | POA: Diagnosis not present

## 2019-12-29 DIAGNOSIS — E8809 Other disorders of plasma-protein metabolism, not elsewhere classified: Secondary | ICD-10-CM | POA: Diagnosis not present

## 2019-12-29 DIAGNOSIS — K9421 Gastrostomy hemorrhage: Secondary | ICD-10-CM | POA: Diagnosis not present

## 2019-12-29 DIAGNOSIS — Z7982 Long term (current) use of aspirin: Secondary | ICD-10-CM

## 2019-12-29 DIAGNOSIS — Z8249 Family history of ischemic heart disease and other diseases of the circulatory system: Secondary | ICD-10-CM

## 2019-12-29 DIAGNOSIS — J90 Pleural effusion, not elsewhere classified: Secondary | ICD-10-CM | POA: Diagnosis not present

## 2019-12-29 DIAGNOSIS — N186 End stage renal disease: Secondary | ICD-10-CM | POA: Diagnosis not present

## 2019-12-29 DIAGNOSIS — Y95 Nosocomial condition: Secondary | ICD-10-CM | POA: Diagnosis not present

## 2019-12-29 DIAGNOSIS — Z9689 Presence of other specified functional implants: Secondary | ICD-10-CM

## 2019-12-29 DIAGNOSIS — K5900 Constipation, unspecified: Secondary | ICD-10-CM | POA: Diagnosis not present

## 2019-12-29 DIAGNOSIS — I132 Hypertensive heart and chronic kidney disease with heart failure and with stage 5 chronic kidney disease, or end stage renal disease: Principal | ICD-10-CM | POA: Diagnosis present

## 2019-12-29 DIAGNOSIS — I428 Other cardiomyopathies: Secondary | ICD-10-CM | POA: Diagnosis present

## 2019-12-29 DIAGNOSIS — M109 Gout, unspecified: Secondary | ICD-10-CM | POA: Diagnosis present

## 2019-12-29 DIAGNOSIS — J9690 Respiratory failure, unspecified, unspecified whether with hypoxia or hypercapnia: Secondary | ICD-10-CM

## 2019-12-29 DIAGNOSIS — L89152 Pressure ulcer of sacral region, stage 2: Secondary | ICD-10-CM | POA: Diagnosis not present

## 2019-12-29 DIAGNOSIS — Z888 Allergy status to other drugs, medicaments and biological substances status: Secondary | ICD-10-CM

## 2019-12-29 DIAGNOSIS — I4891 Unspecified atrial fibrillation: Secondary | ICD-10-CM

## 2019-12-29 DIAGNOSIS — Z992 Dependence on renal dialysis: Secondary | ICD-10-CM

## 2019-12-29 DIAGNOSIS — Z9911 Dependence on respirator [ventilator] status: Secondary | ICD-10-CM

## 2019-12-29 DIAGNOSIS — K219 Gastro-esophageal reflux disease without esophagitis: Secondary | ICD-10-CM | POA: Diagnosis present

## 2019-12-29 DIAGNOSIS — I480 Paroxysmal atrial fibrillation: Secondary | ICD-10-CM | POA: Diagnosis present

## 2019-12-29 DIAGNOSIS — J9622 Acute and chronic respiratory failure with hypercapnia: Secondary | ICD-10-CM | POA: Diagnosis not present

## 2019-12-29 DIAGNOSIS — E1142 Type 2 diabetes mellitus with diabetic polyneuropathy: Secondary | ICD-10-CM | POA: Diagnosis present

## 2019-12-29 DIAGNOSIS — Z880 Allergy status to penicillin: Secondary | ICD-10-CM

## 2019-12-29 DIAGNOSIS — J9600 Acute respiratory failure, unspecified whether with hypoxia or hypercapnia: Secondary | ICD-10-CM

## 2019-12-29 DIAGNOSIS — I517 Cardiomegaly: Secondary | ICD-10-CM | POA: Diagnosis not present

## 2019-12-29 DIAGNOSIS — E871 Hypo-osmolality and hyponatremia: Secondary | ICD-10-CM | POA: Diagnosis not present

## 2019-12-29 DIAGNOSIS — I255 Ischemic cardiomyopathy: Secondary | ICD-10-CM | POA: Diagnosis present

## 2019-12-29 DIAGNOSIS — Z9889 Other specified postprocedural states: Secondary | ICD-10-CM

## 2019-12-29 DIAGNOSIS — R7989 Other specified abnormal findings of blood chemistry: Secondary | ICD-10-CM

## 2019-12-29 DIAGNOSIS — Z419 Encounter for procedure for purposes other than remedying health state, unspecified: Secondary | ICD-10-CM

## 2019-12-29 DIAGNOSIS — E875 Hyperkalemia: Secondary | ICD-10-CM | POA: Diagnosis not present

## 2019-12-29 DIAGNOSIS — A419 Sepsis, unspecified organism: Secondary | ICD-10-CM | POA: Diagnosis not present

## 2019-12-29 DIAGNOSIS — I314 Cardiac tamponade: Secondary | ICD-10-CM | POA: Diagnosis not present

## 2019-12-29 DIAGNOSIS — Z79899 Other long term (current) drug therapy: Secondary | ICD-10-CM

## 2019-12-29 DIAGNOSIS — E785 Hyperlipidemia, unspecified: Secondary | ICD-10-CM | POA: Diagnosis present

## 2019-12-29 DIAGNOSIS — D62 Acute posthemorrhagic anemia: Secondary | ICD-10-CM | POA: Diagnosis not present

## 2019-12-29 DIAGNOSIS — L8990 Pressure ulcer of unspecified site, unspecified stage: Secondary | ICD-10-CM | POA: Insufficient documentation

## 2019-12-29 DIAGNOSIS — Z20822 Contact with and (suspected) exposure to covid-19: Secondary | ICD-10-CM | POA: Diagnosis present

## 2019-12-29 DIAGNOSIS — M79A9 Nontraumatic compartment syndrome of other sites: Secondary | ICD-10-CM | POA: Diagnosis not present

## 2019-12-29 DIAGNOSIS — I10 Essential (primary) hypertension: Secondary | ICD-10-CM | POA: Diagnosis not present

## 2019-12-29 DIAGNOSIS — E1165 Type 2 diabetes mellitus with hyperglycemia: Secondary | ICD-10-CM | POA: Diagnosis present

## 2019-12-29 DIAGNOSIS — I4892 Unspecified atrial flutter: Secondary | ICD-10-CM | POA: Diagnosis not present

## 2019-12-29 DIAGNOSIS — K760 Fatty (change of) liver, not elsewhere classified: Secondary | ICD-10-CM | POA: Diagnosis present

## 2019-12-29 DIAGNOSIS — Z6823 Body mass index (BMI) 23.0-23.9, adult: Secondary | ICD-10-CM

## 2019-12-29 DIAGNOSIS — E876 Hypokalemia: Secondary | ICD-10-CM | POA: Diagnosis not present

## 2019-12-29 DIAGNOSIS — R0602 Shortness of breath: Secondary | ICD-10-CM | POA: Diagnosis not present

## 2019-12-29 DIAGNOSIS — J9621 Acute and chronic respiratory failure with hypoxia: Secondary | ICD-10-CM | POA: Diagnosis present

## 2019-12-29 DIAGNOSIS — S2222XA Fracture of body of sternum, initial encounter for closed fracture: Secondary | ICD-10-CM

## 2019-12-29 DIAGNOSIS — E669 Obesity, unspecified: Secondary | ICD-10-CM | POA: Diagnosis present

## 2019-12-29 DIAGNOSIS — I472 Ventricular tachycardia: Secondary | ICD-10-CM | POA: Diagnosis not present

## 2019-12-29 DIAGNOSIS — J9601 Acute respiratory failure with hypoxia: Secondary | ICD-10-CM

## 2019-12-29 DIAGNOSIS — I509 Heart failure, unspecified: Secondary | ICD-10-CM | POA: Diagnosis not present

## 2019-12-29 DIAGNOSIS — R1115 Cyclical vomiting syndrome unrelated to migraine: Secondary | ICD-10-CM

## 2019-12-29 DIAGNOSIS — J95851 Ventilator associated pneumonia: Secondary | ICD-10-CM | POA: Diagnosis not present

## 2019-12-29 DIAGNOSIS — G8191 Hemiplegia, unspecified affecting right dominant side: Secondary | ICD-10-CM | POA: Diagnosis not present

## 2019-12-29 DIAGNOSIS — Z951 Presence of aortocoronary bypass graft: Secondary | ICD-10-CM

## 2019-12-29 DIAGNOSIS — I5022 Chronic systolic (congestive) heart failure: Secondary | ICD-10-CM

## 2019-12-29 DIAGNOSIS — N179 Acute kidney failure, unspecified: Secondary | ICD-10-CM

## 2019-12-29 DIAGNOSIS — Z452 Encounter for adjustment and management of vascular access device: Secondary | ICD-10-CM

## 2019-12-29 DIAGNOSIS — D6959 Other secondary thrombocytopenia: Secondary | ICD-10-CM | POA: Diagnosis not present

## 2019-12-29 DIAGNOSIS — Z09 Encounter for follow-up examination after completed treatment for conditions other than malignant neoplasm: Secondary | ICD-10-CM

## 2019-12-29 DIAGNOSIS — I9581 Postprocedural hypotension: Secondary | ICD-10-CM | POA: Diagnosis not present

## 2019-12-29 DIAGNOSIS — J939 Pneumothorax, unspecified: Secondary | ICD-10-CM

## 2019-12-29 DIAGNOSIS — R109 Unspecified abdominal pain: Secondary | ICD-10-CM

## 2019-12-29 DIAGNOSIS — Z95811 Presence of heart assist device: Secondary | ICD-10-CM

## 2019-12-29 DIAGNOSIS — T82128A Displacement of other cardiac electronic device, initial encounter: Secondary | ICD-10-CM | POA: Diagnosis not present

## 2019-12-29 DIAGNOSIS — J189 Pneumonia, unspecified organism: Secondary | ICD-10-CM

## 2019-12-29 DIAGNOSIS — I6340 Cerebral infarction due to embolism of unspecified cerebral artery: Secondary | ICD-10-CM | POA: Insufficient documentation

## 2019-12-29 DIAGNOSIS — Z23 Encounter for immunization: Secondary | ICD-10-CM

## 2019-12-29 DIAGNOSIS — R1310 Dysphagia, unspecified: Secondary | ICD-10-CM | POA: Diagnosis not present

## 2019-12-29 DIAGNOSIS — I97618 Postprocedural hemorrhage and hematoma of a circulatory system organ or structure following other circulatory system procedure: Secondary | ICD-10-CM | POA: Diagnosis not present

## 2019-12-29 DIAGNOSIS — I63432 Cerebral infarction due to embolism of left posterior cerebral artery: Secondary | ICD-10-CM | POA: Diagnosis not present

## 2019-12-29 DIAGNOSIS — R4701 Aphasia: Secondary | ICD-10-CM | POA: Diagnosis not present

## 2019-12-29 DIAGNOSIS — Z7901 Long term (current) use of anticoagulants: Secondary | ICD-10-CM

## 2019-12-29 DIAGNOSIS — E1122 Type 2 diabetes mellitus with diabetic chronic kidney disease: Secondary | ICD-10-CM | POA: Diagnosis present

## 2019-12-29 DIAGNOSIS — Z87442 Personal history of urinary calculi: Secondary | ICD-10-CM

## 2019-12-29 DIAGNOSIS — Z9289 Personal history of other medical treatment: Secondary | ICD-10-CM

## 2019-12-29 DIAGNOSIS — Z95810 Presence of automatic (implantable) cardiac defibrillator: Secondary | ICD-10-CM

## 2019-12-29 DIAGNOSIS — Z833 Family history of diabetes mellitus: Secondary | ICD-10-CM

## 2019-12-29 DIAGNOSIS — I63332 Cerebral infarction due to thrombosis of left posterior cerebral artery: Secondary | ICD-10-CM | POA: Diagnosis not present

## 2019-12-29 DIAGNOSIS — Z87892 Personal history of anaphylaxis: Secondary | ICD-10-CM

## 2019-12-29 DIAGNOSIS — D6489 Other specified anemias: Secondary | ICD-10-CM | POA: Diagnosis not present

## 2019-12-29 DIAGNOSIS — F411 Generalized anxiety disorder: Secondary | ICD-10-CM | POA: Diagnosis present

## 2019-12-29 DIAGNOSIS — Z66 Do not resuscitate: Secondary | ICD-10-CM | POA: Diagnosis not present

## 2019-12-29 DIAGNOSIS — I5023 Acute on chronic systolic (congestive) heart failure: Secondary | ICD-10-CM | POA: Diagnosis present

## 2019-12-29 DIAGNOSIS — R069 Unspecified abnormalities of breathing: Secondary | ICD-10-CM

## 2019-12-29 DIAGNOSIS — Z515 Encounter for palliative care: Secondary | ICD-10-CM

## 2019-12-29 DIAGNOSIS — Z4659 Encounter for fitting and adjustment of other gastrointestinal appliance and device: Secondary | ICD-10-CM

## 2019-12-29 DIAGNOSIS — G4733 Obstructive sleep apnea (adult) (pediatric): Secondary | ICD-10-CM | POA: Diagnosis present

## 2019-12-29 DIAGNOSIS — Z8711 Personal history of peptic ulcer disease: Secondary | ICD-10-CM

## 2019-12-29 DIAGNOSIS — I5082 Biventricular heart failure: Secondary | ICD-10-CM | POA: Diagnosis not present

## 2019-12-29 DIAGNOSIS — Z9114 Patient's other noncompliance with medication regimen: Secondary | ICD-10-CM

## 2019-12-29 DIAGNOSIS — M1990 Unspecified osteoarthritis, unspecified site: Secondary | ICD-10-CM | POA: Diagnosis present

## 2019-12-29 DIAGNOSIS — N170 Acute kidney failure with tubular necrosis: Secondary | ICD-10-CM | POA: Diagnosis present

## 2019-12-29 DIAGNOSIS — E874 Mixed disorder of acid-base balance: Secondary | ICD-10-CM | POA: Diagnosis present

## 2019-12-29 DIAGNOSIS — D72829 Elevated white blood cell count, unspecified: Secondary | ICD-10-CM

## 2019-12-29 DIAGNOSIS — Z91018 Allergy to other foods: Secondary | ICD-10-CM

## 2019-12-29 DIAGNOSIS — T8149XA Infection following a procedure, other surgical site, initial encounter: Secondary | ICD-10-CM | POA: Diagnosis not present

## 2019-12-29 DIAGNOSIS — G9341 Metabolic encephalopathy: Secondary | ICD-10-CM | POA: Diagnosis not present

## 2019-12-29 DIAGNOSIS — I452 Bifascicular block: Secondary | ICD-10-CM | POA: Diagnosis not present

## 2019-12-29 DIAGNOSIS — R570 Cardiogenic shock: Secondary | ICD-10-CM | POA: Diagnosis not present

## 2019-12-29 DIAGNOSIS — F319 Bipolar disorder, unspecified: Secondary | ICD-10-CM | POA: Diagnosis present

## 2019-12-29 DIAGNOSIS — Z794 Long term (current) use of insulin: Secondary | ICD-10-CM

## 2019-12-29 LAB — CBC
HCT: 49.1 % (ref 39.0–52.0)
Hemoglobin: 16.1 g/dL (ref 13.0–17.0)
MCH: 27.1 pg (ref 26.0–34.0)
MCHC: 32.8 g/dL (ref 30.0–36.0)
MCV: 82.7 fL (ref 80.0–100.0)
Platelets: 222 10*3/uL (ref 150–400)
RBC: 5.94 MIL/uL — ABNORMAL HIGH (ref 4.22–5.81)
RDW: 16.3 % — ABNORMAL HIGH (ref 11.5–15.5)
WBC: 7.3 10*3/uL (ref 4.0–10.5)
nRBC: 0 % (ref 0.0–0.2)

## 2019-12-29 LAB — BASIC METABOLIC PANEL
Anion gap: 13 (ref 5–15)
BUN: 31 mg/dL — ABNORMAL HIGH (ref 6–20)
CO2: 21 mmol/L — ABNORMAL LOW (ref 22–32)
Calcium: 8.9 mg/dL (ref 8.9–10.3)
Chloride: 96 mmol/L — ABNORMAL LOW (ref 98–111)
Creatinine, Ser: 1.72 mg/dL — ABNORMAL HIGH (ref 0.61–1.24)
GFR, Estimated: 48 mL/min — ABNORMAL LOW (ref >60)
Glucose, Bld: 143 mg/dL — ABNORMAL HIGH (ref 70–99)
Potassium: 4.9 mmol/L (ref 3.5–5.1)
Sodium: 130 mmol/L — ABNORMAL LOW (ref 135–145)

## 2019-12-29 LAB — RESP PANEL BY RT-PCR (FLU A&B, COVID) ARPGX2
Influenza A by PCR: NEGATIVE
Influenza B by PCR: NEGATIVE
SARS Coronavirus 2 by RT PCR: NEGATIVE

## 2019-12-29 LAB — TROPONIN I (HIGH SENSITIVITY)
Troponin I (High Sensitivity): 64 ng/L — ABNORMAL HIGH (ref <18)
Troponin I (High Sensitivity): 92 ng/L — ABNORMAL HIGH (ref <18)

## 2019-12-29 LAB — BRAIN NATRIURETIC PEPTIDE: B Natriuretic Peptide: 577.5 pg/mL — ABNORMAL HIGH (ref 0.0–100.0)

## 2019-12-29 LAB — CBG MONITORING, ED: Glucose-Capillary: 86 mg/dL (ref 70–99)

## 2019-12-29 LAB — MAGNESIUM: Magnesium: 1.7 mg/dL (ref 1.7–2.4)

## 2019-12-29 MED ORDER — MORPHINE SULFATE (PF) 2 MG/ML IV SOLN
2.0000 mg | Freq: Once | INTRAVENOUS | Status: DC
Start: 2019-12-29 — End: 2019-12-29

## 2019-12-29 MED ORDER — ONDANSETRON 4 MG PO TBDP
4.0000 mg | ORAL_TABLET | Freq: Once | ORAL | Status: AC | PRN
  Administered 2019-12-29: 4 mg via ORAL
  Filled 2019-12-29: qty 1

## 2019-12-29 MED ORDER — ETOMIDATE 2 MG/ML IV SOLN
10.0000 mg | Freq: Once | INTRAVENOUS | Status: AC
  Administered 2019-12-29: 10 mg via INTRAVENOUS
  Filled 2019-12-29: qty 10

## 2019-12-29 MED ORDER — ETOMIDATE 2 MG/ML IV SOLN: INTRAVENOUS | Status: AC | PRN

## 2019-12-29 MED ORDER — ONDANSETRON HCL 4 MG/2ML IJ SOLN
INTRAMUSCULAR | Status: AC
  Administered 2019-12-29: 4 mg via INTRAVENOUS
  Filled 2019-12-29: qty 2

## 2019-12-29 MED ORDER — ONDANSETRON HCL 4 MG/2ML IJ SOLN
4.0000 mg | Freq: Four times a day (QID) | INTRAMUSCULAR | Status: DC | PRN
  Administered 2019-12-29 – 2020-01-04 (×6): 4 mg via INTRAVENOUS
  Filled 2019-12-29 (×6): qty 2

## 2019-12-29 MED ORDER — DAPAGLIFLOZIN PROPANEDIOL 10 MG PO TABS
10.0000 mg | ORAL_TABLET | Freq: Every day | ORAL | Status: DC
  Filled 2019-12-29: qty 1

## 2019-12-29 MED ORDER — FUROSEMIDE 10 MG/ML IJ SOLN
80.0000 mg | Freq: Two times a day (BID) | INTRAMUSCULAR | Status: DC
  Administered 2019-12-29 – 2019-12-30 (×2): 80 mg via INTRAVENOUS
  Filled 2019-12-29 (×2): qty 8

## 2019-12-29 MED ORDER — BUSPIRONE HCL 10 MG PO TABS
10.0000 mg | ORAL_TABLET | Freq: Three times a day (TID) | ORAL | Status: DC
  Administered 2019-12-30 – 2020-01-11 (×35): 10 mg via ORAL
  Filled 2019-12-29 (×36): qty 1

## 2019-12-29 MED ORDER — ASPIRIN EC 81 MG PO TBEC: 81.0000 mg | DELAYED_RELEASE_TABLET | Freq: Every day | ORAL | Status: DC

## 2019-12-29 MED ORDER — FUROSEMIDE 10 MG/ML IJ SOLN
20.0000 mg | Freq: Once | INTRAMUSCULAR | Status: AC
  Administered 2019-12-29: 20 mg via INTRAVENOUS
  Filled 2019-12-29: qty 2

## 2019-12-29 MED ORDER — FENTANYL CITRATE (PF) 100 MCG/2ML IJ SOLN
12.5000 ug | Freq: Once | INTRAMUSCULAR | Status: AC
  Administered 2019-12-29: 12.5 ug via INTRAVENOUS
  Filled 2019-12-29: qty 2

## 2019-12-29 MED ORDER — APIXABAN 5 MG PO TABS
5.0000 mg | ORAL_TABLET | Freq: Two times a day (BID) | ORAL | Status: DC
  Administered 2019-12-30 – 2020-01-03 (×10): 5 mg via ORAL
  Filled 2019-12-29 (×10): qty 1

## 2019-12-29 MED ORDER — CARVEDILOL 12.5 MG PO TABS
12.5000 mg | ORAL_TABLET | Freq: Two times a day (BID) | ORAL | Status: DC
  Administered 2019-12-30 – 2019-12-31 (×3): 12.5 mg via ORAL
  Filled 2019-12-29: qty 4
  Filled 2019-12-29 (×2): qty 1

## 2019-12-29 MED ORDER — INSULIN ASPART 100 UNIT/ML ~~LOC~~ SOLN
0.0000 [IU] | Freq: Three times a day (TID) | SUBCUTANEOUS | Status: DC
  Administered 2019-12-30 (×2): 3 [IU] via SUBCUTANEOUS
  Administered 2019-12-31: 2 [IU] via SUBCUTANEOUS
  Administered 2019-12-31: 5 [IU] via SUBCUTANEOUS
  Administered 2019-12-31: 2 [IU] via SUBCUTANEOUS
  Administered 2020-01-01: 5 [IU] via SUBCUTANEOUS
  Administered 2020-01-01: 2 [IU] via SUBCUTANEOUS
  Administered 2020-01-01 – 2020-01-02 (×2): 3 [IU] via SUBCUTANEOUS
  Administered 2020-01-02: 5 [IU] via SUBCUTANEOUS
  Administered 2020-01-02: 2 [IU] via SUBCUTANEOUS
  Administered 2020-01-03: 3 [IU] via SUBCUTANEOUS
  Administered 2020-01-03 (×2): 2 [IU] via SUBCUTANEOUS
  Administered 2020-01-04: 11 [IU] via SUBCUTANEOUS
  Administered 2020-01-04: 5 [IU] via SUBCUTANEOUS
  Administered 2020-01-04 – 2020-01-05 (×3): 3 [IU] via SUBCUTANEOUS
  Administered 2020-01-06: 11 [IU] via SUBCUTANEOUS
  Administered 2020-01-06: 2 [IU] via SUBCUTANEOUS
  Administered 2020-01-06: 11 [IU] via SUBCUTANEOUS
  Administered 2020-01-07: 5 [IU] via SUBCUTANEOUS
  Administered 2020-01-07 (×2): 8 [IU] via SUBCUTANEOUS
  Administered 2020-01-08: 18:00:00 5 [IU] via SUBCUTANEOUS
  Administered 2020-01-08: 07:00:00 3 [IU] via SUBCUTANEOUS

## 2019-12-29 MED ORDER — MORPHINE SULFATE (PF) 4 MG/ML IV SOLN: 4.0000 mg | Freq: Once | INTRAVENOUS | Status: AC

## 2019-12-29 MED ORDER — DIVALPROEX SODIUM ER 500 MG PO TB24
500.0000 mg | ORAL_TABLET | Freq: Every day | ORAL | Status: DC
  Administered 2019-12-30 – 2020-01-11 (×13): 500 mg via ORAL
  Filled 2019-12-29 (×15): qty 1

## 2019-12-29 MED ORDER — CARVEDILOL 3.125 MG PO TABS: 25.0000 mg | ORAL_TABLET | Freq: Two times a day (BID) | ORAL | Status: DC

## 2019-12-29 MED ORDER — FLUOXETINE HCL 20 MG PO CAPS
40.0000 mg | ORAL_CAPSULE | Freq: Two times a day (BID) | ORAL | Status: DC
  Administered 2019-12-30 – 2020-01-11 (×25): 40 mg via ORAL
  Filled 2019-12-29 (×25): qty 2

## 2019-12-29 MED ORDER — DICYCLOMINE HCL 10 MG PO CAPS
10.0000 mg | ORAL_CAPSULE | Freq: Three times a day (TID) | ORAL | Status: DC
  Administered 2019-12-30 – 2020-01-11 (×31): 10 mg via ORAL
  Filled 2019-12-29 (×40): qty 1

## 2019-12-29 MED ORDER — ONDANSETRON HCL 4 MG/2ML IJ SOLN: 4.0000 mg | Freq: Once | INTRAMUSCULAR | Status: AC

## 2019-12-29 MED ORDER — SODIUM CHLORIDE 0.9 % IV SOLN: 250.0000 mL | INTRAVENOUS | Status: DC | PRN

## 2019-12-29 MED ORDER — INSULIN ASPART 100 UNIT/ML ~~LOC~~ SOLN
0.0000 [IU] | Freq: Every day | SUBCUTANEOUS | Status: DC
  Administered 2020-01-02 – 2020-01-03 (×2): 2 [IU] via SUBCUTANEOUS
  Administered 2020-01-04: 3 [IU] via SUBCUTANEOUS
  Administered 2020-01-05 – 2020-01-06 (×2): 2 [IU] via SUBCUTANEOUS
  Administered 2020-01-07: 4 [IU] via SUBCUTANEOUS

## 2019-12-29 MED ORDER — ATORVASTATIN CALCIUM 80 MG PO TABS
80.0000 mg | ORAL_TABLET | Freq: Every day | ORAL | Status: DC
  Administered 2019-12-30 – 2020-01-11 (×13): 80 mg via ORAL
  Filled 2019-12-29 (×2): qty 1
  Filled 2019-12-29: qty 2
  Filled 2019-12-29 (×10): qty 1

## 2019-12-29 MED ORDER — ACETAMINOPHEN 325 MG PO TABS
650.0000 mg | ORAL_TABLET | ORAL | Status: DC | PRN
  Administered 2019-12-30 – 2020-01-04 (×3): 650 mg via ORAL
  Filled 2019-12-29 (×3): qty 2

## 2019-12-29 MED ORDER — SODIUM CHLORIDE 0.9% FLUSH
3.0000 mL | Freq: Two times a day (BID) | INTRAVENOUS | Status: DC
  Administered 2019-12-30 – 2020-01-04 (×6): 3 mL via INTRAVENOUS

## 2019-12-29 MED ORDER — SODIUM CHLORIDE 0.9% FLUSH: 3.0000 mL | INTRAVENOUS | Status: DC | PRN

## 2019-12-29 MED ORDER — MORPHINE SULFATE (PF) 4 MG/ML IV SOLN
INTRAVENOUS | Status: AC
  Administered 2019-12-29: 4 mg via INTRAVENOUS
  Filled 2019-12-29: qty 1

## 2019-12-29 NOTE — Consult Note (Signed)
Cardiology Consultation:   Patient ID: Corey Palmer MRN: [id number]; DOB: 10/31/1970  Admit date: 01/28/2020 Date of Consult: 01/25/2020  Primary Care Provider: Sueanne Margarita, MD [facility] Cardiologist: [facility] Electrophysiologist:  Cristopher Peru, MD    Patient Profile:   Corey Palmer is a 49 y.o. male with a hx of nonischemic cardiomyopathy who is being seen today for the evaluation of atrial fibrillation at the request of Corey Palmer.  History of Present Illness:   Corey Palmer is a 49 year old male with a history of nonischemic cardiomyopathy and paroxysmal atrial fibrillation.  He has had a cardiomyopathy since the early 2000's.  He has a Environmental health practitioner ICD.  He had an echo from [facility] 09/17/2018 that showed an ejection fraction of approximately 15%.  He is on Eliquis for his atrial fibrillation.  He also has type II DM Be[place] and is on insulin.  He has sleep apnea.  The patient was feeling well yesterday.  This morning, he feels that he had shocks from his ICD.  After his perceived second ICD shock, the patient complained of chest pain.  The chest pain was 10 out of 10 in the emergency room.  Review of his device interrogation shows no ICD shocks.  He is in atrial fibrillation.  Per the emergency room physician, the patient has been compliant with his anticoagulation.  He did receive cardioversion in the emergency room to sinus tachycardia.   Past Medical History:  Diagnosis Date  . Anginal pain ([place])   . Anxiety   . Arthritis   . Automatic implantable cardioverter-defibrillator in situ 2007   [employer]   . Bipolar disorder ([place])   . CAD (coronary artery disease)    Cardiac cath (08/19/2000) - Nonobstructive, 40% stenosis of LAD,.   . Cancer [facility]) 2013   benign stomach cancer  . CHF (congestive heart failure) ([place])   . Chondrocalcinosis of right knee 06/11/2013  . Chronic systolic heart failure ([place])    2D echo (35/0093) - Systolic function severely  reduced., LV EF 20-25%. Akinesis of apical and anteroseptal mycoardium.  last 2D echo (11/2008 ), LV EF 25-30%, diffuse hypokinesis, and grade 1 diastolic dysfunction.  . Depression   . Diabetes uncomplicated adult-type II    on insulin therapy, a1c 12.9 in 01/2011  . Dilated cardiomyopathy ([place])    status post ICD placement in 2008 c/b tearing at the atrial junction resulting in tamponade and urgent thoracotomy  . Dyslipidemia   . Dysrhythmia   . GERD (gastroesophageal reflux disease)   . Gout, unspecified   . Hepatic steatosis 2011   seen on ultrasound.   . History of kidney stones   . Hx of echocardiogram    Echo (04/2013):  Mild LVH, EF 20-25%, mild LAE, mild to mod RVE with mild reduced RVSF.  Marland Kitchen Hypertension   . Obesity (BMI 30.0-34.9)   . Pacemaker   . Paroxysmal atrial fibrillation ([facility])    on chronic coumadin, goal INR 2-3. status post direct current  . Peptic ulcer   . Shortness of breath   . Sleep apnea    does not have CPAP    Past Surgical History:  Procedure Laterality Date  . BIOPSY  12/01/2019   Procedure: BIOPSY;  Surgeon: Arta Silence, MD;  Location: [facility] ENDOSCOPY;  Service: Endoscopy;;  . CARDIAC CATHETERIZATION    . CARDIAC PACEMAKER PLACEMENT    . CHOLECYSTECTOMY    . COLONOSCOPY    . CORONARY ARTERY BYPASS GRAFT    .  ESOPHAGOGASTRODUODENOSCOPY  04/05/2011   Procedure: ESOPHAGOGASTRODUODENOSCOPY (EGD);  Surgeon: Irene Shipper, MD;  [facility] ENDOSCOPY;  Service: Endoscopy;  Laterality: N/A;  . ESOPHAGOGASTRODUODENOSCOPY (EGD) WITH PROPOFOL N/A 12/01/2019   Procedure: ESOPHAGOGASTRODUODENOSCOPY (EGD) WITH PROPOFOL;  Surgeon: Arta Silence, MD;  Location: [facility] ENDOSCOPY;  Service: Endoscopy;  Laterality: N/A;  . JOINT REPLACEMENT     left knee replacement  . KNEE ARTHROSCOPY Right 06/11/2013   Procedure: RIGHT KNEE ARTHROSCOPY with chondroplasty;  Surgeon: Johnny Bridge, MD;  Location: [place];  Service: Orthopedics;  Laterality: Right;  . TOTAL KNEE  ARTHROPLASTY Left      Home Medications:  Prior to Admission medications   Medication Sig Start Date End Date Taking? Authorizing Provider  apixaban (ELIQUIS) 5 MG TABS tablet Take 1 tablet (5 mg total) by mouth 2 (two) times daily. 12/02/19   Arta Silence, MD  busPIRone (BUSPAR) 10 MG tablet Take 1 tablet (10 mg total) by mouth 3 (three) times daily. 12/06/19 12/05/20  Pucilowski, Marchia Bond, MD  carvedilol (COREG) 25 MG tablet Take 1 tablet (25 mg total) by mouth 2 (two) times daily. Needs appt 09/10/19   Larey Dresser, MD  dapagliflozin propanediol (FARXIGA) 10 MG TABS tablet Take 10 mg by mouth daily before breakfast. 02/22/19   Larey Dresser, MD  digoxin (LANOXIN) 0.125 MG tablet Take 1 tablet (0.125 mg total) by mouth daily. 02/19/19 02/24/2020  Larey Dresser, MD  divalproex (DEPAKOTE ER) 500 MG 24 hr tablet Take 1 tablet (500 mg total) by mouth at bedtime. 12/06/19 12/05/20  Pucilowski, Marchia Bond, MD  FLUoxetine (PROZAC) 20 MG capsule Take 4 capsules (80 mg total) by mouth daily. 12/06/19 12/05/20  Pucilowski, Marchia Bond, MD  furosemide (LASIX) 20 MG tablet TAKE 2 TABLETS (40 MG TOTAL) BY MOUTH IN THE MORNING AND 1 TABLET (20 MG TOTAL) EVERY EVENING. 11/08/19   Larey Dresser, MD  glucose blood (CONTOUR TEST) test strip 1 each by Other route 2 (two) times daily. And lancets 1/day 02/03/19   Renato Shin, MD  hydrOXYzine (VISTARIL) 50 MG capsule Take 1 capsule (50 mg total) by mouth 3 (three) times daily as needed for anxiety. 12/06/19 12/05/20  Pucilowski, Olgierd A, MD  insulin glargine (LANTUS SOLOSTAR) 100 UNIT/ML Solostar Pen Inject 40 Units into the skin every morning. 12/01/19   Renato Shin, MD  magnesium oxide (MAG-OX) 400 MG tablet Take 800 mg by mouth daily.     [provider]  omeprazole (PRILOSEC) 40 MG capsule Take 1 capsule (40 mg total) by mouth daily. 08/18/13   Larey Dresser, MD  Potassium (POTASSIMIN PO) Take 400 mg by mouth daily.    [provider]   promethazine (PHENERGAN) 25 MG tablet Take 25 mg by mouth in the morning and at bedtime.  10/12/19   [provider]  sacubitril-valsartan (ENTRESTO) 49-51 MG Take 1 tablet by mouth 2 (two) times daily. 02/19/19   Larey Dresser, MD  Semaglutide,0.25 or 0.5MG /DOS, (OZEMPIC, 0.25 OR 0.5 MG/DOSE,) 2 MG/1.5ML SOPN Inject 0.5 mg into the skin once a week. 12/01/19   Renato Shin, MD  spironolactone (ALDACTONE) 25 MG tablet Take 1 tablet (25 mg total) by mouth daily. Needs appt 06/22/19   Larey Dresser, MD    Inpatient Medications: Scheduled Meds: . furosemide  20 mg Intravenous Once   Continuous Infusions:  PRN Meds:   Allergies:    Allergies  Allergen Reactions  . Orange Fruit Anaphylaxis    Per Pt-  Blister around lips and Hives all over   . Penicillins Anaphylaxis    Did it involve swelling of the face/tongue/throat, SOB, or low BP? Yes Did it involve sudden or severe rash/hives, skin peeling, or any reaction on the inside of your mouth or nose? Yes Did you need to seek medical attention at a hospital or doctor's office? No When did it last happen?childhood If all above answers are "NO", may proceed with cephalosporin use.  [name] [Empagliflozin] Itching  . [name] [Insulin Glargine] Nausea And Vomiting    Social History:   Social History   Socioeconomic History  . Marital status: Married    Spouse name: Not on file  . Number of children: 3  . Years of education: college  . Highest education level: Not on file  Occupational History  . Occupation: bar tender  Tobacco Use  . Smoking status: Never Smoker  . Smokeless tobacco: Never Used  Vaping Use  . Vaping Use: Never used  Substance and Sexual Activity  . Alcohol use: No  . Drug use: No  . Sexual activity: Yes  Other Topics Concern  . Not on file  Social History Narrative   Lives in [place].   Studied sports medicine in college, got a BS - used to work with the [employer].          Social Determinants of Health   Financial Resource Strain:   . Difficulty of Paying Living Expenses: Not on file  Food Insecurity:   . Worried About Charity fundraiser in the Last Year: Not on file  . Ran Out of Food in the Last Year: Not on file  Transportation Needs:   . Lack of Transportation (Medical): Not on file  . Lack of Transportation (Non-Medical): Not on file  Physical Activity:   . Days of Exercise per Week: Not on file  . Minutes of Exercise per Session: Not on file  Stress:   . Feeling of Stress : Not on file  Social Connections:   . Frequency of Communication with Friends and Family: Not on file  . Frequency of Social Gatherings with Friends and Family: Not on file  . Attends Religious Services: Not on file  . Active Member of Clubs or Organizations: Not on file  . Attends Archivist Meetings: Not on file  . Marital Status: Not on file  Intimate Partner Violence:   . Fear of Current or Ex-Partner: Not on file  . Emotionally Abused: Not on file  . Physically Abused: Not on file  . Sexually Abused: Not on file    Family History:    Family History  Problem Relation Age of Onset  . Diabetes Mother   . Coronary artery disease Mother 75  . Gout Mother   . Heart disease Mother   . Heart attack Mother 30  . Diabetes Father   . Coronary artery disease Father 47       Died in a house fire   . Heart disease Father   . Heart disease Brother      ROS:  Please see the history of present illness.   All other ROS reviewed and negative.     Physical Exam/Data:   Vitals:   01/17/2020 1509  BP: (!) 146/110  Pulse: 60  Resp: 18  Temp: 97.6 F (36.4 C)  TempSrc: Oral  SpO2: 95%  Weight: 122.9 kg  Height: 6\' 3"  (1.905 m)   No intake or output data in  the 24 hours ending 01/03/2020 1705 Last 3 Weights 01/27/2020 12/01/2019 12/01/2019  Weight (lbs) 271 lb 261 lb 250 lb  Weight (kg) 122.925 kg 118.389 kg 113.399 kg  Some encounter information is  confidential and restricted. Go to Review Flowsheets activity to see all data.     Body mass index is 33.87 kg/m.  General:  Well nourished, well developed, in no acute distress HEENT: normal Lymph: no adenopathy Neck: no JVD Endocrine:  No thryomegaly Vascular: No carotid bruits; FA pulses 2+ bilaterally without bruits  Cardiac:  normal S1, S2; tachycardic, regular; no murmur  Lungs:  clear to auscultation bilaterally, no wheezing, rhonchi or rales  Abd: soft, nontender, no hepatomegaly  Ext: 2+ edema Musculoskeletal:  No deformities, BUE and BLE strength normal and equal Skin: warm and dry  Neuro:  CNs 2-12 intact, no focal abnormalities noted Psych:  Normal affect   EKG:  The EKG was personally reviewed and demonstrates: Sinus tachycardia Telemetry:  Telemetry was personally reviewed and demonstrates: Atrial fibrillation  Relevant CV Studies: - Echo 8/20: EF 15%, diffuse hypokinesis, mildly decreased RV systolic function, mild-moderate MR.   Laboratory Data:  High Sensitivity Troponin:  No results for input(s): TROPONINIHS in the last 720 hours.   ChemistryNo results for input(s): NA, K, CL, CO2, GLUCOSE, BUN, CREATININE, CALCIUM, GFRNONAA, GFRAA, ANIONGAP in the last 168 hours.  No results for input(s): PROT, ALBUMIN, AST, ALT, ALKPHOS, BILITOT in the last 168 hours. Hematology Recent Labs  Lab 01/22/2020 1544  WBC 7.3  RBC 5.94*  HGB 16.1  HCT 49.1  MCV 82.7  MCH 27.1  MCHC 32.8  RDW 16.3*  PLT 222   BNPNo results for input(s): BNP, PROBNP in the last 168 hours.  DDimer No results for input(s): DDIMER in the last 168 hours.   Radiology/Studies:  DG Chest 2 View  Result Date: 01/25/2020 CLINICAL DATA:  Pt states his defibrillator fired 2 times today - also took 2 nitro pills - still having the chest pain and increased HR, also some SOB - hx of CHF, htn, diabetes, AFIB, sleep apnea, GERD, benign stomach cancer EXAM: CHEST - 2 VIEW COMPARISON:  10/01/2018 and older  exams. FINDINGS: Mild to moderate enlargement of the cardiopericardial silhouette, stable. No mediastinal or hilar masses. Left anterior chest wall single lead AICD is unchanged, tip projecting over the right ventricle. Stable changes from a previous median sternotomy. Lungs are clear. No convincing pleural effusion and no pneumothorax. Skeletal structures are grossly intact. IMPRESSION: 1. No acute cardiopulmonary disease. 2. Stable cardiomegaly. No change in the position of the left anterior chest wall AICD. Electronically Signed   By: Lajean Manes M.D.   On: 01/02/2020 16:20     Assessment and Plan:   1. Paroxysmal atrial fibrillation: Patient is currently on Eliquis.  CHA2DS2-VASc of 2.  He went into atrial fibrillation and had a perceived ICD shock, though interrogation shows no shock delivered.  He had a cardioversion in the emergency room back to sinus rhythm. 2. Chronic systolic heart failure due to nonischemic cardiomyopathy: Patient is currently on Entresto, Aldactone, Lasix, Farxiga, and carvedilol.  He appears to be volume overloaded.His creatinine is above his baseline today.  When he wakes up from sedation from his cardioversion, if he does need to be admitted, Patrick Sohm diurese with 80 mg of twice daily Lasix. 3. Coronary artery disease: Nonobstructive.  He did have chest pain when he was tachycardic into the 150s.  Troponin is 63 and likely reactive to his  tachycardia.  We Evony Rezek check 1 more troponin and if it is unchanged, unlikely ischemic. 4. Obstructive sleep apnea: CPAP compliance encouraged.  For questions or updates, please contact [place] Please consult [email or web address] for contact info under    Signed, Onnie Alatorre Meredith Leeds, MD  01/03/2020 5:05 PM

## 2019-12-29 NOTE — ED Triage Notes (Signed)
Pt here from home after being shocked at 1130 and 1300 by his [employer] ICD. Pt took two nitro after being shocked the second time. Also reports fluid retention and swelling in legs today. Pt of Dr. Marigene Ehlers.

## 2019-12-29 NOTE — ED Provider Notes (Signed)
Corey Palmer   CSN: [id number] Arrival date & time: 01/11/2020  1506     History No chief complaint on file.   Corey Palmer is a 49 y.o. male.   Chest Pain Pain location:  Substernal area Pain quality: aching   Pain severity:  Severe Onset quality:  Gradual Timing:  Constant Progression:  Worsening Chronicity:  Recurrent Context comment:  A fib rvr Relieved by:  Nothing Worsened by:  Nothing Ineffective treatments:  None tried Associated symptoms: cough and palpitations   Associated symptoms: no back pain, no fever, no headache, no nausea, no shortness of breath and no vomiting        Past Medical History:  Diagnosis Date  . Anginal pain ([place])   . Anxiety   . Arthritis   . Automatic implantable cardioverter-defibrillator in situ 2007   [employer]   . Bipolar disorder ([place])   . CAD (coronary artery disease)    Cardiac cath (08/19/2000) - Nonobstructive, 40% stenosis of LAD,.   . Cancer [facility]) 2013   benign stomach cancer  . CHF (congestive heart failure) ([place])   . Chondrocalcinosis of right knee 06/11/2013  . Chronic systolic heart failure ([place])    2D echo (15/1761) - Systolic function severely reduced., LV EF 20-25%. Akinesis of apical and anteroseptal mycoardium.  last 2D echo (11/2008 ), LV EF 25-30%, diffuse hypokinesis, and grade 1 diastolic dysfunction.  . Depression   . Diabetes uncomplicated adult-type II    on insulin therapy, a1c 12.9 in 01/2011  . Dilated cardiomyopathy ([place])    status post ICD placement in 2008 c/b tearing at the atrial junction resulting in tamponade and urgent thoracotomy  . Dyslipidemia   . Dysrhythmia   . GERD (gastroesophageal reflux disease)   . Gout, unspecified   . Hepatic steatosis 2011   seen on ultrasound.   . History of kidney stones   . Hx of echocardiogram    Echo (04/2013):  Mild LVH, EF 20-25%, mild LAE, mild to mod RVE with mild reduced RVSF.  Marland Kitchen Hypertension   .  Obesity (BMI 30.0-34.9)   . Pacemaker   . Paroxysmal atrial fibrillation (HCC)    on chronic coumadin, goal INR 2-3. status post direct current  . Peptic ulcer   . Shortness of breath   . Sleep apnea    does not have CPAP    Patient Active Problem List   Diagnosis Date Noted  . CHF, acute on chronic ([place]) 10/02/2018  . Acute on chronic systolic CHF (congestive heart failure) ([place]) 10/01/2018  . Acute on chronic congestive heart failure ([place])   . Bipolar 2 disorder ([place]) 03/17/2018  . GAD (generalized anxiety disorder) 03/17/2018  . Cellulitis 08/08/2017  . Uncontrolled type 2 diabetes mellitus with hyperglycemia ([place]) 08/08/2017  . Cellulitis, leg 08/07/2017  . Chondrocalcinosis of right knee 06/11/2013  . Chondromalacia of left knee 06/11/2013  . Encounter for therapeutic drug monitoring 03/29/2013  . Prolonged Q-T interval on ECG 09/05/2011  . Acute on chronic systolic heart failure ([place]) 07/20/2011  . Acute on chronic combined systolic and diastolic heart failure, NYHA class 3 ([place]) 07/17/2011  . Gastric ulcer, acute 04/05/2011  . Nonspecific abnormal finding in stool contents 04/04/2011  . Gastroenteritis 04/04/2011  . Hematemesis 04/03/2011  . Chest pain 02/18/2011  . Long term current use of anticoagulant 03/19/2010  . Atrial fibrillation ([place]) 12/18/2009  . OBESITY 10/19/2009  . Ischemic cardiomyopathy 12/05/2008  . Implantable cardioverter-defibrillator (ICD)  in situ 11/25/2008  . CAD 11/16/2008  . HYPERTRIGLYCERIDEMIA 11/11/2008  . PERICARDIAL EFFUSION 03/29/2008  . HEMATURIA UNSPECIFIED 03/29/2008  . METABOLIC ACIDOSIS 99/35/7017  . DYSLIPIDEMIA 03/11/2008  . Gout, unspecified 03/11/2008  . Essential hypertension, benign 03/11/2008  . Chronic systolic congestive heart failure, NYHA class 2 ([place]) 03/11/2008  . HYPOVOLEMIC SHOCK 03/11/2008    Past Surgical History:  Procedure Laterality Date  . BIOPSY  12/01/2019   Procedure: BIOPSY;  Surgeon: Arta Silence, MD;  Location: [facility] ENDOSCOPY;  Service: Endoscopy;;  . CARDIAC CATHETERIZATION    . CARDIAC PACEMAKER PLACEMENT    . CHOLECYSTECTOMY    . COLONOSCOPY    . CORONARY ARTERY BYPASS GRAFT    . ESOPHAGOGASTRODUODENOSCOPY  04/05/2011   Procedure: ESOPHAGOGASTRODUODENOSCOPY (EGD);  Surgeon: Irene Shipper, MD;  Location: [facility] ENDOSCOPY;  Service: Endoscopy;  Laterality: N/A;  . ESOPHAGOGASTRODUODENOSCOPY (EGD) WITH PROPOFOL N/A 12/01/2019   Procedure: ESOPHAGOGASTRODUODENOSCOPY (EGD) WITH PROPOFOL;  Surgeon: Arta Silence, MD;  Location: [facility] ENDOSCOPY;  Service: Endoscopy;  Laterality: N/A;  . JOINT REPLACEMENT     left knee replacement  . KNEE ARTHROSCOPY Right 06/11/2013   Procedure: RIGHT KNEE ARTHROSCOPY with chondroplasty;  Surgeon: Johnny Bridge, MD;  Location: [place];  Service: Orthopedics;  Laterality: Right;  . TOTAL KNEE ARTHROPLASTY Left        Family History  Problem Relation Age of Onset  . Diabetes Mother   . Coronary artery disease Mother 47  . Gout Mother   . Heart disease Mother   . Heart attack Mother 59  . Diabetes Father   . Coronary artery disease Father 91       Died in a house fire   . Heart disease Father   . Heart disease Brother     Social History   Tobacco Use  . Smoking status: Never Smoker  . Smokeless tobacco: Never Used  Vaping Use  . Vaping Use: Never used  Substance Use Topics  . Alcohol use: No  . Drug use: No    Home Medications Prior to Admission medications   Medication Sig Start Date End Date Taking? Authorizing Provider  apixaban (ELIQUIS) 5 MG TABS tablet Take 1 tablet (5 mg total) by mouth 2 (two) times daily. 12/02/19   Arta Silence, MD  busPIRone (BUSPAR) 10 MG tablet Take 1 tablet (10 mg total) by mouth 3 (three) times daily. 12/06/19 12/05/20  Pucilowski, Marchia Bond, MD  carvedilol (COREG) 25 MG tablet Take 1 tablet (25 mg total) by mouth 2 (two) times daily. Needs appt 09/10/19   Larey Dresser, MD  dapagliflozin propanediol  (FARXIGA) 10 MG TABS tablet Take 10 mg by mouth daily before breakfast. 02/22/19   Larey Dresser, MD  digoxin (LANOXIN) 0.125 MG tablet Take 1 tablet (0.125 mg total) by mouth daily. 02/19/19 02/21/2020  Larey Dresser, MD  divalproex (DEPAKOTE ER) 500 MG 24 hr tablet Take 1 tablet (500 mg total) by mouth at bedtime. 12/06/19 12/05/20  Pucilowski, Marchia Bond, MD  FLUoxetine (PROZAC) 20 MG capsule Take 4 capsules (80 mg total) by mouth daily. 12/06/19 12/05/20  Pucilowski, Marchia Bond, MD  furosemide (LASIX) 20 MG tablet TAKE 2 TABLETS (40 MG TOTAL) BY MOUTH IN THE MORNING AND 1 TABLET (20 MG TOTAL) EVERY EVENING. 11/08/19   Larey Dresser, MD  glucose blood (CONTOUR TEST) test strip 1 each by Other route 2 (two) times daily. And lancets 1/day 02/03/19   Renato Shin, MD  hydrOXYzine (VISTARIL) 50  MG capsule Take 1 capsule (50 mg total) by mouth 3 (three) times daily as needed for anxiety. 12/06/19 12/05/20  Pucilowski, Olgierd A, MD  insulin glargine (LANTUS SOLOSTAR) 100 UNIT/ML Solostar Pen Inject 40 Units into the skin every morning. 12/01/19   Renato Shin, MD  magnesium oxide (MAG-OX) 400 MG tablet Take 800 mg by mouth daily.     [provider]  omeprazole (PRILOSEC) 40 MG capsule Take 1 capsule (40 mg total) by mouth daily. 08/18/13   Larey Dresser, MD  Potassium (POTASSIMIN PO) Take 400 mg by mouth daily.    [provider]  promethazine (PHENERGAN) 25 MG tablet Take 25 mg by mouth in the morning and at bedtime.  10/12/19   [provider]  sacubitril-valsartan (ENTRESTO) 49-51 MG Take 1 tablet by mouth 2 (two) times daily. 02/19/19   Larey Dresser, MD  Semaglutide,0.25 or 0.5MG /DOS, (OZEMPIC, 0.25 OR 0.5 MG/DOSE,) 2 MG/1.5ML SOPN Inject 0.5 mg into the skin once a week. 12/01/19   Renato Shin, MD  spironolactone (ALDACTONE) 25 MG tablet Take 1 tablet (25 mg total) by mouth daily. Needs appt 06/22/19   Larey Dresser, MD    Allergies    Orange fruit, Penicillins,  Jardiance [empagliflozin], and Basaglar kwikpen [insulin glargine]  Review of Systems   Review of Systems  Constitutional: Negative for chills and fever.  HENT: Negative for congestion and rhinorrhea.   Respiratory: Positive for cough. Negative for shortness of breath.   Cardiovascular: Positive for chest pain, palpitations and leg swelling.  Gastrointestinal: Negative for diarrhea, nausea and vomiting.  Genitourinary: Negative for difficulty urinating and dysuria.  Musculoskeletal: Negative for arthralgias and back pain.  Skin: Negative for color change and rash.  Neurological: Negative for light-headedness and headaches.    Physical Exam Updated Vital Signs BP (!) 142/107   Pulse (!) 122   Temp 97.6 F (36.4 C) (Oral)   Resp (!) 22   Ht 6\' 3"  (1.905 m)   Wt 122.9 kg   SpO2 97%   BMI 33.87 kg/m   Physical Exam Vitals and nursing Palmer reviewed. Exam conducted with a chaperone present.  Constitutional:      General: He is not in acute distress.    Appearance: Normal appearance.  HENT:     Head: Normocephalic and atraumatic.     Nose: No rhinorrhea.  Eyes:     General:        Right eye: No discharge.        Left eye: No discharge.     Conjunctiva/sclera: Conjunctivae normal.  Cardiovascular:     Rate and Rhythm: Tachycardia present. Rhythm irregular.  Pulmonary:     Effort: Pulmonary effort is normal.     Breath sounds: No stridor.  Abdominal:     General: Abdomen is flat. There is no distension.     Palpations: Abdomen is soft.  Musculoskeletal:        General: No deformity or signs of injury.     Right lower leg: Edema present.     Left lower leg: Edema present.  Skin:    General: Skin is warm and dry.  Neurological:     General: No focal deficit present.     Mental Status: He is alert. Mental status is at baseline.     Motor: No weakness.  Psychiatric:        Mood and Affect: Mood normal.        Behavior: Behavior normal.  Thought Content: Thought  content normal.     ED Results / Procedures / Treatments   Labs (all labs ordered are listed, but only abnormal results are displayed) Labs Reviewed  BASIC METABOLIC PANEL - Abnormal; Notable for the following components:      Result Value   Sodium 130 (*)    Chloride 96 (*)    CO2 21 (*)    Glucose, Bld 143 (*)    BUN 31 (*)    Creatinine, Ser 1.72 (*)    GFR, Estimated 48 (*)    All other components within normal limits  CBC - Abnormal; Notable for the following components:   RBC 5.94 (*)    RDW 16.3 (*)    All other components within normal limits  BRAIN NATRIURETIC PEPTIDE - Abnormal; Notable for the following components:   B Natriuretic Peptide 577.5 (*)    All other components within normal limits  TROPONIN I (HIGH SENSITIVITY) - Abnormal; Notable for the following components:   Troponin I (High Sensitivity) 64 (*)    All other components within normal limits  RESP PANEL BY RT-PCR (FLU A&B, COVID) ARPGX2  MAGNESIUM  TROPONIN I (HIGH SENSITIVITY)    EKG EKG Interpretation  Date/Time:  Wednesday December 29 2019 15:51:26 EST Ventricular Rate:  153 PR Interval:    QRS Duration: 110 QT Interval:  334 QTC Calculation: 533 R Axis:   20 Text Interpretation: Atrial fibrillation with rapid ventricular response Septal infarct , age undetermined Abnormal ECG Confirmed by Dewaine Conger [phone]) on 01/17/2020 4:33:29 PM   Radiology DG Chest 2 View  Result Date: 12/30/2019 CLINICAL DATA:  Pt states his defibrillator fired 2 times today - also took 2 nitro pills - still having the chest pain and increased HR, also some SOB - hx of CHF, htn, diabetes, AFIB, sleep apnea, GERD, benign stomach cancer EXAM: CHEST - 2 VIEW COMPARISON:  10/01/2018 and older exams. FINDINGS: Mild to moderate enlargement of the cardiopericardial silhouette, stable. No mediastinal or hilar masses. Left anterior chest wall single lead AICD is unchanged, tip projecting over the right ventricle. Stable changes  from a previous median sternotomy. Lungs are clear. No convincing pleural effusion and no pneumothorax. Skeletal structures are grossly intact. IMPRESSION: 1. No acute cardiopulmonary disease. 2. Stable cardiomegaly. No change in the position of the left anterior chest wall AICD. Electronically Signed   By: Lajean Manes M.D.   On: 01/06/2020 16:20    Procedures .Critical Care Performed by: Breck Coons, MD Authorized by: Breck Coons, MD   Critical care provider statement:    Critical care time (minutes):  60   Critical care was necessary to treat or prevent imminent or life-threatening deterioration of the following conditions:  Circulatory failure (a fib rvr)   Critical care was time spent personally by me on the following activities:  Discussions with consultants, evaluation of patient's response to treatment, examination of patient, ordering and performing treatments and interventions, ordering and review of laboratory studies, ordering and review of radiographic studies, pulse oximetry, re-evaluation of patient's condition, obtaining history from patient or surrogate, review of old charts, blood draw for specimens and development of treatment plan with patient or surrogate .Cardioversion  Date/Time: 12/30/2019 7:10 PM Performed by: Breck Coons, MD Authorized by: Breck Coons, MD   Consent:    Consent obtained:  Verbal   Consent given by:  Patient   Risks discussed:  Cutaneous burn, death, induced arrhythmia and pain   Alternatives  discussed:  No treatment Pre-procedure details:    Cardioversion basis:  Emergent   Rhythm:  Atrial fibrillation Patient sedated: Yes. Refer to sedation procedure documentation for details of sedation.  Attempt one:    Cardioversion mode:  Synchronous   Shock (Joules):  200   Shock outcome:  Conversion to normal sinus rhythm Post-procedure details:    Patient status:  Awake   (including critical care time)  Medications Ordered in ED Medications   ondansetron (ZOFRAN-ODT) disintegrating tablet 4 mg (4 mg Oral Given 01/28/2020 1548)  etomidate (AMIDATE) injection 10 mg (10 mg Intravenous Given 01/05/2020 1650)  morphine 4 MG/ML injection 4 mg (4 mg Intravenous Given 01/27/2020 1640)  ondansetron (ZOFRAN) injection 4 mg (4 mg Intravenous Given 01/01/2020 1640)  furosemide (LASIX) injection 20 mg (20 mg Intravenous Given 01/14/2020 1828)  etomidate (AMIDATE) injection (0 mg Intravenous Hold 01/09/2020 1707)    ED Course  I have reviewed the triage vital signs and the nursing notes.  Pertinent labs & imaging results that were available during my care of the patient were reviewed by me and considered in my medical decision making (see chart for details).    MDM Rules/Calculators/A&P                          Afib RVR with severe chest pain, cardioverted at bedside with etomidate for sedation cardiology at bedside to interrogate ICD, they said the ICD did not fire, the patient says he is certain that it fired twice.  Patient's creatinine is elevated trending up over the last several lab draws.  Troponin is up but likely secondary to cardioversion.  Post cardioversion EKG shows sinus rhythm without acute ischemic change interval abnormality or arrhythmia otherwise.  The cardiology team says that if he needs to be admitted it would be to their service.  He is given some diuresis but I feel he needs more than just one-time dose of diuresis and will benefit from overnight diuresis and reevaluation in the morning.  He remains tachycardic and mildly tachypneic.  He states has had a mild cough but no fevers.  Covid test another viral panel is negative.  Chest x-ray shows no significant signs of infection stable cardiomegaly.  Final Clinical Impression(s) / ED Diagnoses Final diagnoses:  Atrial fibrillation with RVR (HCC)  Elevated serum creatinine    Rx / DC Orders ED Discharge Orders    None       Breck Coons, MD 01/25/2020 1912

## 2019-12-29 NOTE — ED Notes (Addendum)
Paged cards r/t to return of pt chest pain and requesting pain meds.  Pt was actively vomiting and gave zofran according to [facility].  PO meds not given at scheduled time r/t vomiting

## 2019-12-29 NOTE — H&P (Signed)
Please see consult note written by Dr. Curt Bears (who saw the patient earlier today), which will serve as H&P, as the patient will be admitted post-cardioversion.   Corey Nodarse Kathlen Mody, PA-C

## 2019-12-29 NOTE — ED Notes (Addendum)
Pt tolerated emergent cardioversion well.  Please see sedation narrator for further documentation

## 2019-12-30 ENCOUNTER — Observation Stay: Payer: Self-pay

## 2019-12-30 ENCOUNTER — Observation Stay (HOSPITAL_COMMUNITY): Payer: BC Managed Care – PPO

## 2019-12-30 ENCOUNTER — Other Ambulatory Visit: Payer: Self-pay

## 2019-12-30 ENCOUNTER — Encounter (HOSPITAL_COMMUNITY): Payer: Self-pay | Admitting: Cardiology

## 2019-12-30 DIAGNOSIS — J80 Acute respiratory distress syndrome: Secondary | ICD-10-CM | POA: Diagnosis not present

## 2019-12-30 DIAGNOSIS — R0600 Dyspnea, unspecified: Secondary | ICD-10-CM | POA: Diagnosis not present

## 2019-12-30 DIAGNOSIS — J181 Lobar pneumonia, unspecified organism: Secondary | ICD-10-CM | POA: Diagnosis not present

## 2019-12-30 DIAGNOSIS — I4891 Unspecified atrial fibrillation: Secondary | ICD-10-CM

## 2019-12-30 DIAGNOSIS — J9601 Acute respiratory failure with hypoxia: Secondary | ICD-10-CM | POA: Diagnosis not present

## 2019-12-30 DIAGNOSIS — J9690 Respiratory failure, unspecified, unspecified whether with hypoxia or hypercapnia: Secondary | ICD-10-CM | POA: Diagnosis not present

## 2019-12-30 DIAGNOSIS — Z66 Do not resuscitate: Secondary | ICD-10-CM | POA: Diagnosis not present

## 2019-12-30 DIAGNOSIS — R109 Unspecified abdominal pain: Secondary | ICD-10-CM | POA: Diagnosis not present

## 2019-12-30 DIAGNOSIS — H748X3 Other specified disorders of middle ear and mastoid, bilateral: Secondary | ICD-10-CM | POA: Diagnosis not present

## 2019-12-30 DIAGNOSIS — Z95811 Presence of heart assist device: Secondary | ICD-10-CM | POA: Diagnosis not present

## 2019-12-30 DIAGNOSIS — I25119 Atherosclerotic heart disease of native coronary artery with unspecified angina pectoris: Secondary | ICD-10-CM | POA: Diagnosis not present

## 2019-12-30 DIAGNOSIS — N179 Acute kidney failure, unspecified: Secondary | ICD-10-CM | POA: Diagnosis not present

## 2019-12-30 DIAGNOSIS — J9622 Acute and chronic respiratory failure with hypercapnia: Secondary | ICD-10-CM | POA: Diagnosis not present

## 2019-12-30 DIAGNOSIS — R7989 Other specified abnormal findings of blood chemistry: Secondary | ICD-10-CM | POA: Diagnosis present

## 2019-12-30 DIAGNOSIS — S20219A Contusion of unspecified front wall of thorax, initial encounter: Secondary | ICD-10-CM | POA: Diagnosis not present

## 2019-12-30 DIAGNOSIS — J9620 Acute and chronic respiratory failure, unspecified whether with hypoxia or hypercapnia: Secondary | ICD-10-CM | POA: Diagnosis not present

## 2019-12-30 DIAGNOSIS — Z951 Presence of aortocoronary bypass graft: Secondary | ICD-10-CM | POA: Diagnosis not present

## 2019-12-30 DIAGNOSIS — I313 Pericardial effusion (noninflammatory): Secondary | ICD-10-CM | POA: Diagnosis not present

## 2019-12-30 DIAGNOSIS — R29818 Other symptoms and signs involving the nervous system: Secondary | ICD-10-CM | POA: Diagnosis not present

## 2019-12-30 DIAGNOSIS — I5043 Acute on chronic combined systolic (congestive) and diastolic (congestive) heart failure: Secondary | ICD-10-CM | POA: Diagnosis not present

## 2019-12-30 DIAGNOSIS — I639 Cerebral infarction, unspecified: Secondary | ICD-10-CM | POA: Diagnosis not present

## 2019-12-30 DIAGNOSIS — G4730 Sleep apnea, unspecified: Secondary | ICD-10-CM | POA: Diagnosis not present

## 2019-12-30 DIAGNOSIS — I5023 Acute on chronic systolic (congestive) heart failure: Secondary | ICD-10-CM | POA: Diagnosis not present

## 2019-12-30 DIAGNOSIS — G9341 Metabolic encephalopathy: Secondary | ICD-10-CM | POA: Diagnosis not present

## 2019-12-30 DIAGNOSIS — I63432 Cerebral infarction due to embolism of left posterior cerebral artery: Secondary | ICD-10-CM | POA: Diagnosis not present

## 2019-12-30 DIAGNOSIS — Z4682 Encounter for fitting and adjustment of non-vascular catheter: Secondary | ICD-10-CM | POA: Diagnosis not present

## 2019-12-30 DIAGNOSIS — E1165 Type 2 diabetes mellitus with hyperglycemia: Secondary | ICD-10-CM | POA: Diagnosis not present

## 2019-12-30 DIAGNOSIS — I63332 Cerebral infarction due to thrombosis of left posterior cerebral artery: Secondary | ICD-10-CM | POA: Diagnosis not present

## 2019-12-30 DIAGNOSIS — E871 Hypo-osmolality and hyponatremia: Secondary | ICD-10-CM | POA: Diagnosis not present

## 2019-12-30 DIAGNOSIS — I5021 Acute systolic (congestive) heart failure: Secondary | ICD-10-CM

## 2019-12-30 DIAGNOSIS — M79A9 Nontraumatic compartment syndrome of other sites: Secondary | ICD-10-CM | POA: Diagnosis not present

## 2019-12-30 DIAGNOSIS — R1110 Vomiting, unspecified: Secondary | ICD-10-CM | POA: Diagnosis not present

## 2019-12-30 DIAGNOSIS — J984 Other disorders of lung: Secondary | ICD-10-CM | POA: Diagnosis not present

## 2019-12-30 DIAGNOSIS — J9811 Atelectasis: Secondary | ICD-10-CM | POA: Diagnosis not present

## 2019-12-30 DIAGNOSIS — E785 Hyperlipidemia, unspecified: Secondary | ICD-10-CM | POA: Diagnosis not present

## 2019-12-30 DIAGNOSIS — D649 Anemia, unspecified: Secondary | ICD-10-CM | POA: Diagnosis not present

## 2019-12-30 DIAGNOSIS — T8132XA Disruption of internal operation (surgical) wound, not elsewhere classified, initial encounter: Secondary | ICD-10-CM | POA: Diagnosis not present

## 2019-12-30 DIAGNOSIS — M9689 Other intraoperative and postprocedural complications and disorders of the musculoskeletal system: Secondary | ICD-10-CM | POA: Diagnosis not present

## 2019-12-30 DIAGNOSIS — I517 Cardiomegaly: Secondary | ICD-10-CM | POA: Diagnosis not present

## 2019-12-30 DIAGNOSIS — Z9049 Acquired absence of other specified parts of digestive tract: Secondary | ICD-10-CM | POA: Diagnosis not present

## 2019-12-30 DIAGNOSIS — J811 Chronic pulmonary edema: Secondary | ICD-10-CM | POA: Diagnosis not present

## 2019-12-30 DIAGNOSIS — G8191 Hemiplegia, unspecified affecting right dominant side: Secondary | ICD-10-CM | POA: Diagnosis not present

## 2019-12-30 DIAGNOSIS — Z23 Encounter for immunization: Secondary | ICD-10-CM | POA: Diagnosis not present

## 2019-12-30 DIAGNOSIS — A419 Sepsis, unspecified organism: Secondary | ICD-10-CM | POA: Diagnosis not present

## 2019-12-30 DIAGNOSIS — G9340 Encephalopathy, unspecified: Secondary | ICD-10-CM | POA: Diagnosis not present

## 2019-12-30 DIAGNOSIS — R739 Hyperglycemia, unspecified: Secondary | ICD-10-CM | POA: Diagnosis not present

## 2019-12-30 DIAGNOSIS — T82837A Hemorrhage of cardiac prosthetic devices, implants and grafts, initial encounter: Secondary | ICD-10-CM | POA: Diagnosis not present

## 2019-12-30 DIAGNOSIS — Z20822 Contact with and (suspected) exposure to covid-19: Secondary | ICD-10-CM | POA: Diagnosis not present

## 2019-12-30 DIAGNOSIS — Z515 Encounter for palliative care: Secondary | ICD-10-CM | POA: Diagnosis not present

## 2019-12-30 DIAGNOSIS — I509 Heart failure, unspecified: Secondary | ICD-10-CM | POA: Diagnosis not present

## 2019-12-30 DIAGNOSIS — N183 Chronic kidney disease, stage 3 unspecified: Secondary | ICD-10-CM

## 2019-12-30 DIAGNOSIS — Z7189 Other specified counseling: Secondary | ICD-10-CM | POA: Diagnosis not present

## 2019-12-30 DIAGNOSIS — I472 Ventricular tachycardia: Secondary | ICD-10-CM | POA: Diagnosis not present

## 2019-12-30 DIAGNOSIS — N170 Acute kidney failure with tubular necrosis: Secondary | ICD-10-CM | POA: Diagnosis not present

## 2019-12-30 DIAGNOSIS — Y95 Nosocomial condition: Secondary | ICD-10-CM | POA: Diagnosis not present

## 2019-12-30 DIAGNOSIS — I63512 Cerebral infarction due to unspecified occlusion or stenosis of left middle cerebral artery: Secondary | ICD-10-CM | POA: Diagnosis not present

## 2019-12-30 DIAGNOSIS — I340 Nonrheumatic mitral (valve) insufficiency: Secondary | ICD-10-CM | POA: Diagnosis not present

## 2019-12-30 DIAGNOSIS — I480 Paroxysmal atrial fibrillation: Secondary | ICD-10-CM | POA: Diagnosis not present

## 2019-12-30 DIAGNOSIS — D72829 Elevated white blood cell count, unspecified: Secondary | ICD-10-CM | POA: Diagnosis not present

## 2019-12-30 DIAGNOSIS — J9621 Acute and chronic respiratory failure with hypoxia: Secondary | ICD-10-CM | POA: Diagnosis not present

## 2019-12-30 DIAGNOSIS — K3189 Other diseases of stomach and duodenum: Secondary | ICD-10-CM | POA: Diagnosis not present

## 2019-12-30 DIAGNOSIS — R918 Other nonspecific abnormal finding of lung field: Secondary | ICD-10-CM | POA: Diagnosis not present

## 2019-12-30 DIAGNOSIS — I129 Hypertensive chronic kidney disease with stage 1 through stage 4 chronic kidney disease, or unspecified chronic kidney disease: Secondary | ICD-10-CM | POA: Diagnosis not present

## 2019-12-30 DIAGNOSIS — I10 Essential (primary) hypertension: Secondary | ICD-10-CM | POA: Diagnosis not present

## 2019-12-30 DIAGNOSIS — N186 End stage renal disease: Secondary | ICD-10-CM | POA: Diagnosis not present

## 2019-12-30 DIAGNOSIS — D62 Acute posthemorrhagic anemia: Secondary | ICD-10-CM | POA: Diagnosis not present

## 2019-12-30 DIAGNOSIS — I2510 Atherosclerotic heart disease of native coronary artery without angina pectoris: Secondary | ICD-10-CM | POA: Diagnosis not present

## 2019-12-30 DIAGNOSIS — R4701 Aphasia: Secondary | ICD-10-CM | POA: Diagnosis not present

## 2019-12-30 DIAGNOSIS — R0902 Hypoxemia: Secondary | ICD-10-CM | POA: Diagnosis not present

## 2019-12-30 DIAGNOSIS — I110 Hypertensive heart disease with heart failure: Secondary | ICD-10-CM | POA: Diagnosis not present

## 2019-12-30 DIAGNOSIS — I97638 Postprocedural hematoma of a circulatory system organ or structure following other circulatory system procedure: Secondary | ICD-10-CM | POA: Diagnosis not present

## 2019-12-30 DIAGNOSIS — Z9911 Dependence on respirator [ventilator] status: Secondary | ICD-10-CM | POA: Diagnosis not present

## 2019-12-30 DIAGNOSIS — I5022 Chronic systolic (congestive) heart failure: Secondary | ICD-10-CM | POA: Diagnosis not present

## 2019-12-30 DIAGNOSIS — Z01810 Encounter for preprocedural cardiovascular examination: Secondary | ICD-10-CM | POA: Diagnosis not present

## 2019-12-30 DIAGNOSIS — Z452 Encounter for adjustment and management of vascular access device: Secondary | ICD-10-CM | POA: Diagnosis not present

## 2019-12-30 DIAGNOSIS — I6389 Other cerebral infarction: Secondary | ICD-10-CM | POA: Diagnosis not present

## 2019-12-30 DIAGNOSIS — I314 Cardiac tamponade: Secondary | ICD-10-CM | POA: Diagnosis not present

## 2019-12-30 DIAGNOSIS — R21 Rash and other nonspecific skin eruption: Secondary | ICD-10-CM | POA: Diagnosis not present

## 2019-12-30 DIAGNOSIS — J9602 Acute respiratory failure with hypercapnia: Secondary | ICD-10-CM | POA: Diagnosis not present

## 2019-12-30 DIAGNOSIS — S21109A Unspecified open wound of unspecified front wall of thorax without penetration into thoracic cavity, initial encounter: Secondary | ICD-10-CM | POA: Diagnosis not present

## 2019-12-30 DIAGNOSIS — R52 Pain, unspecified: Secondary | ICD-10-CM | POA: Diagnosis not present

## 2019-12-30 DIAGNOSIS — K6389 Other specified diseases of intestine: Secondary | ICD-10-CM | POA: Diagnosis not present

## 2019-12-30 DIAGNOSIS — I132 Hypertensive heart and chronic kidney disease with heart failure and with stage 5 chronic kidney disease, or end stage renal disease: Secondary | ICD-10-CM | POA: Diagnosis not present

## 2019-12-30 DIAGNOSIS — T8149XA Infection following a procedure, other surgical site, initial encounter: Secondary | ICD-10-CM | POA: Diagnosis not present

## 2019-12-30 DIAGNOSIS — E1129 Type 2 diabetes mellitus with other diabetic kidney complication: Secondary | ICD-10-CM | POA: Diagnosis not present

## 2019-12-30 DIAGNOSIS — I9789 Other postprocedural complications and disorders of the circulatory system, not elsewhere classified: Secondary | ICD-10-CM | POA: Diagnosis not present

## 2019-12-30 DIAGNOSIS — R570 Cardiogenic shock: Secondary | ICD-10-CM | POA: Diagnosis not present

## 2019-12-30 DIAGNOSIS — Z9281 Personal history of extracorporeal membrane oxygenation (ECMO): Secondary | ICD-10-CM | POA: Diagnosis not present

## 2019-12-30 DIAGNOSIS — S21101A Unspecified open wound of right front wall of thorax without penetration into thoracic cavity, initial encounter: Secondary | ICD-10-CM | POA: Diagnosis not present

## 2019-12-30 DIAGNOSIS — Z9889 Other specified postprocedural states: Secondary | ICD-10-CM | POA: Diagnosis not present

## 2019-12-30 DIAGNOSIS — J189 Pneumonia, unspecified organism: Secondary | ICD-10-CM | POA: Diagnosis not present

## 2019-12-30 DIAGNOSIS — I083 Combined rheumatic disorders of mitral, aortic and tricuspid valves: Secondary | ICD-10-CM | POA: Diagnosis not present

## 2019-12-30 DIAGNOSIS — J90 Pleural effusion, not elsewhere classified: Secondary | ICD-10-CM | POA: Diagnosis not present

## 2019-12-30 DIAGNOSIS — R6521 Severe sepsis with septic shock: Secondary | ICD-10-CM | POA: Diagnosis not present

## 2019-12-30 DIAGNOSIS — S21102A Unspecified open wound of left front wall of thorax without penetration into thoracic cavity, initial encounter: Secondary | ICD-10-CM | POA: Diagnosis not present

## 2019-12-30 DIAGNOSIS — R079 Chest pain, unspecified: Secondary | ICD-10-CM | POA: Diagnosis not present

## 2019-12-30 DIAGNOSIS — Z95810 Presence of automatic (implantable) cardiac defibrillator: Secondary | ICD-10-CM | POA: Diagnosis not present

## 2019-12-30 LAB — TROPONIN I (HIGH SENSITIVITY)
Troponin I (High Sensitivity): 110 ng/L (ref <18)
Troponin I (High Sensitivity): 100 ng/L (ref <18)

## 2019-12-30 LAB — ECHOCARDIOGRAM COMPLETE
Area-P 1/2: 5.27 cm2
Height: 75 in
S' Lateral: 6.9 cm
Weight: 4211.67 oz

## 2019-12-30 LAB — CBC
HCT: 49.4 % (ref 39.0–52.0)
Hemoglobin: 15.5 g/dL (ref 13.0–17.0)
MCH: 26.5 pg (ref 26.0–34.0)
MCHC: 31.4 g/dL (ref 30.0–36.0)
MCV: 84.4 fL (ref 80.0–100.0)
Platelets: 199 10*3/uL (ref 150–400)
RBC: 5.85 MIL/uL — ABNORMAL HIGH (ref 4.22–5.81)
RDW: 16.4 % — ABNORMAL HIGH (ref 11.5–15.5)
WBC: 8.4 10*3/uL (ref 4.0–10.5)
nRBC: 0 % (ref 0.0–0.2)

## 2019-12-30 LAB — COOXEMETRY PANEL
Carboxyhemoglobin: 0.9 % (ref 0.5–1.5)
Carboxyhemoglobin: 1.1 % (ref 0.5–1.5)
Methemoglobin: 0.7 % (ref 0.0–1.5)
Methemoglobin: 0.8 % (ref 0.0–1.5)
O2 Saturation: 26.8 %
O2 Saturation: 42.8 %
Total hemoglobin: 15.6 g/dL (ref 12.0–16.0)
Total hemoglobin: 14.8 g/dL (ref 12.0–16.0)

## 2019-12-30 LAB — BASIC METABOLIC PANEL
Anion gap: 15 (ref 5–15)
Anion gap: 12 (ref 5–15)
BUN: 39 mg/dL — ABNORMAL HIGH (ref 6–20)
BUN: 42 mg/dL — ABNORMAL HIGH (ref 6–20)
CO2: 19 mmol/L — ABNORMAL LOW (ref 22–32)
CO2: 22 mmol/L (ref 22–32)
Calcium: 8.4 mg/dL — ABNORMAL LOW (ref 8.9–10.3)
Calcium: 8.5 mg/dL — ABNORMAL LOW (ref 8.9–10.3)
Chloride: 95 mmol/L — ABNORMAL LOW (ref 98–111)
Chloride: 95 mmol/L — ABNORMAL LOW (ref 98–111)
Creatinine, Ser: 2.03 mg/dL — ABNORMAL HIGH (ref 0.61–1.24)
Creatinine, Ser: 2.2 mg/dL — ABNORMAL HIGH (ref 0.61–1.24)
GFR, Estimated: 39 mL/min — ABNORMAL LOW (ref >60)
GFR, Estimated: 36 mL/min — ABNORMAL LOW (ref >60)
Glucose, Bld: 122 mg/dL — ABNORMAL HIGH (ref 70–99)
Glucose, Bld: 167 mg/dL — ABNORMAL HIGH (ref 70–99)
Potassium: 5.6 mmol/L — ABNORMAL HIGH (ref 3.5–5.1)
Potassium: 4.8 mmol/L (ref 3.5–5.1)
Sodium: 129 mmol/L — ABNORMAL LOW (ref 135–145)
Sodium: 129 mmol/L — ABNORMAL LOW (ref 135–145)

## 2019-12-30 LAB — HIV ANTIBODY (ROUTINE TESTING W REFLEX): HIV Screen 4th Generation wRfx: NONREACTIVE

## 2019-12-30 LAB — GLUCOSE, CAPILLARY
Glucose-Capillary: 154 mg/dL — ABNORMAL HIGH (ref 70–99)
Glucose-Capillary: 153 mg/dL — ABNORMAL HIGH (ref 70–99)
Glucose-Capillary: 182 mg/dL — ABNORMAL HIGH (ref 70–99)

## 2019-12-30 LAB — MAGNESIUM: Magnesium: 1.6 mg/dL — ABNORMAL LOW (ref 1.7–2.4)

## 2019-12-30 LAB — CBG MONITORING, ED: Glucose-Capillary: 126 mg/dL — ABNORMAL HIGH (ref 70–99)

## 2019-12-30 MED ORDER — ACETAMINOPHEN 325 MG PO TABS: 650.0000 mg | ORAL_TABLET | Freq: Four times a day (QID) | ORAL | Status: DC | PRN

## 2019-12-30 MED ORDER — TRAMADOL HCL 50 MG PO TABS
50.0000 mg | ORAL_TABLET | Freq: Four times a day (QID) | ORAL | Status: DC | PRN
  Administered 2019-12-30 – 2020-01-11 (×12): 50 mg via ORAL
  Filled 2019-12-30 (×12): qty 1

## 2019-12-30 MED ORDER — NITROGLYCERIN 0.4 MG SL SUBL
SUBLINGUAL_TABLET | SUBLINGUAL | Status: AC
  Administered 2019-12-30: 0.4 mg
  Filled 2019-12-30: qty 1

## 2019-12-30 MED ORDER — SODIUM ZIRCONIUM CYCLOSILICATE 10 G PO PACK
10.0000 g | PACK | Freq: Once | ORAL | Status: AC
  Administered 2019-12-30: 10 g via ORAL
  Filled 2019-12-30: qty 1

## 2019-12-30 MED ORDER — AMIODARONE HCL IN DEXTROSE 360-4.14 MG/200ML-% IV SOLN
30.0000 mg/h | INTRAVENOUS | Status: DC
  Administered 2019-12-30 – 2020-01-01 (×3): 30 mg/h via INTRAVENOUS
  Administered 2020-01-01 – 2020-01-04 (×12): 60 mg/h via INTRAVENOUS
  Administered 2020-01-05 – 2020-01-12 (×12): 30 mg/h via INTRAVENOUS
  Administered 2020-01-13 – 2020-01-14 (×4): 60 mg/h via INTRAVENOUS
  Administered 2020-01-14: 13:00:00 30 mg/h via INTRAVENOUS
  Administered 2020-01-14: 05:00:00 60 mg/h via INTRAVENOUS
  Administered 2020-01-15 – 2020-01-18 (×8): 30 mg/h via INTRAVENOUS
  Administered 2020-01-19 (×3): 60 mg/h via INTRAVENOUS
  Administered 2020-01-19: 01:00:00 30 mg/h via INTRAVENOUS
  Administered 2020-01-19 – 2020-01-20 (×5): 60 mg/h via INTRAVENOUS
  Administered 2020-01-21: 07:00:00 36 mg/h via INTRAVENOUS
  Administered 2020-01-21 – 2020-01-22 (×4): 60 mg/h via INTRAVENOUS
  Administered 2020-01-23: 14:00:00 30 mg/h via INTRAVENOUS
  Administered 2020-01-23 – 2020-01-24 (×4): 60 mg/h via INTRAVENOUS
  Administered 2020-01-24: 02:00:00 30 mg/h via INTRAVENOUS
  Administered 2020-01-24 – 2020-01-26 (×7): 60 mg/h via INTRAVENOUS
  Administered 2020-01-26 – 2020-02-07 (×26): 30 mg/h via INTRAVENOUS
  Administered 2020-02-08: 60 mg/h via INTRAVENOUS
  Administered 2020-02-08 – 2020-02-09 (×2): 30 mg/h via INTRAVENOUS
  Administered 2020-02-09: 60 mg/h via INTRAVENOUS
  Administered 2020-02-09: 30 mg/h via INTRAVENOUS
  Administered 2020-02-10 – 2020-02-11 (×9): 60 mg/h via INTRAVENOUS
  Administered 2020-02-12: 30 mg/h via INTRAVENOUS
  Administered 2020-02-12 (×3): 60 mg/h via INTRAVENOUS
  Administered 2020-02-13 – 2020-02-22 (×21): 30 mg/h via INTRAVENOUS
  Filled 2019-12-30 (×27): qty 200
  Filled 2019-12-30: qty 400
  Filled 2019-12-30 (×54): qty 200
  Filled 2019-12-30: qty 400
  Filled 2019-12-30 (×46): qty 200
  Filled 2019-12-30: qty 400
  Filled 2019-12-30 (×10): qty 200

## 2019-12-30 MED ORDER — FUROSEMIDE 10 MG/ML IJ SOLN
40.0000 mg | Freq: Once | INTRAMUSCULAR | Status: AC
  Administered 2019-12-30: 40 mg via INTRAVENOUS
  Filled 2019-12-30: qty 4

## 2019-12-30 MED ORDER — SODIUM CHLORIDE 0.9% FLUSH
10.0000 mL | Freq: Two times a day (BID) | INTRAVENOUS | Status: DC
  Administered 2019-12-31 – 2020-01-03 (×2): 10 mL
  Administered 2020-01-03: 20 mL
  Administered 2020-01-04 – 2020-01-11 (×11): 10 mL

## 2019-12-30 MED ORDER — PERFLUTREN LIPID MICROSPHERE
1.0000 mL | INTRAVENOUS | Status: AC | PRN
  Administered 2019-12-30: 3 mL via INTRAVENOUS
  Filled 2019-12-30: qty 10

## 2019-12-30 MED ORDER — MILRINONE LACTATE IN DEXTROSE 20-5 MG/100ML-% IV SOLN
0.2500 ug/kg/min | INTRAVENOUS | Status: DC
  Administered 2019-12-30 – 2020-01-04 (×10): 0.25 ug/kg/min via INTRAVENOUS
  Filled 2019-12-30 (×11): qty 100

## 2019-12-30 MED ORDER — CHLORHEXIDINE GLUCONATE CLOTH 2 % EX PADS
6.0000 | MEDICATED_PAD | Freq: Every day | CUTANEOUS | Status: DC
  Administered 2019-12-31 – 2020-01-13 (×12): 6 via TOPICAL

## 2019-12-30 MED ORDER — INFLUENZA VAC SPLIT QUAD 0.5 ML IM SUSY
0.5000 mL | PREFILLED_SYRINGE | INTRAMUSCULAR | Status: AC
  Administered 2020-01-01: 0.5 mL via INTRAMUSCULAR
  Filled 2019-12-30: qty 0.5

## 2019-12-30 MED ORDER — FUROSEMIDE 10 MG/ML IJ SOLN
20.0000 mg/h | INTRAVENOUS | Status: DC
  Administered 2019-12-30 – 2019-12-31 (×2): 10 mg/h via INTRAVENOUS
  Administered 2020-01-01: 15 mg/h via INTRAVENOUS
  Administered 2020-01-02 – 2020-01-04 (×6): 20 mg/h via INTRAVENOUS
  Filled 2019-12-30 (×12): qty 20

## 2019-12-30 MED ORDER — MAGNESIUM SULFATE 2 GM/50ML IV SOLN
2.0000 g | Freq: Once | INTRAVENOUS | Status: AC
  Administered 2019-12-30: 2 g via INTRAVENOUS
  Filled 2019-12-30: qty 50

## 2019-12-30 MED ORDER — AMIODARONE HCL IN DEXTROSE 360-4.14 MG/200ML-% IV SOLN
60.0000 mg/h | INTRAVENOUS | Status: AC
  Administered 2019-12-30: 60 mg/h via INTRAVENOUS
  Filled 2019-12-30: qty 200

## 2019-12-30 MED ORDER — SODIUM CHLORIDE 0.9% FLUSH: 10.0000 mL | INTRAVENOUS | Status: DC | PRN

## 2019-12-30 NOTE — Progress Notes (Signed)
Co-ox low at 27%. D/w Dr. Aundra Dubin.   Start Milrinone 0.25 mcg/kg/min + Amio gtt at 60 hr x 6 hrs>>30 hr  Given 40 mg bolus IV Lasix (1 hr post milrinone initiation) followed by lasix gtt at 10 mg/hr.   Repeat BMP this afternoon. Follow co-ox   RN updated on plan.   Lyda Jester, PA-C 12/30/2019

## 2019-12-30 NOTE — Progress Notes (Signed)
Echocardiogram 2D Echocardiogram has been performed.  Oneal Deputy Merrit Friesen 12/30/2019, 12:19 PM

## 2019-12-30 NOTE — Progress Notes (Signed)
Peripherally Inserted Central Catheter Placement  The IV Nurse has discussed with the patient and/or persons authorized to consent for the patient, the purpose of this procedure and the potential benefits and risks involved with this procedure.  The benefits include less needle sticks, lab draws from the catheter, and the patient may be discharged home with the catheter. Risks include, but not limited to, infection, bleeding, blood clot (thrombus formation), and puncture of an artery; nerve damage and irregular heartbeat and possibility to perform a PICC exchange if needed/ordered by physician.  Alternatives to this procedure were also discussed.  Bard Power PICC patient education guide, fact sheet on infection prevention and patient information card has been provided to patient /or left at bedside.    PICC Placement Documentation  PICC Double Lumen 12/30/19 PICC Right Brachial 42 cm 0 cm (Active)  Indication for Insertion or Continuance of Line Vasoactive infusions 12/30/19 1200  Exposed Catheter (cm) 0 cm 12/30/19 1200  Site Assessment Clean;Dry;Intact 12/30/19 1200  Lumen #1 Status Flushed;Blood return noted 12/30/19 1200  Lumen #2 Status Flushed;Blood return noted 12/30/19 1200  Dressing Type Transparent 12/30/19 1200  Dressing Status Clean;Dry;Intact 12/30/19 1200  Antimicrobial disc in place? Yes 12/30/19 1200  Dressing Intervention New dressing 12/30/19 1200  Dressing Change Due 01/06/20 12/30/19 1200       Jule Economy Horton 12/30/2019, 12:43 PM

## 2019-12-30 NOTE — Plan of Care (Signed)
?  Problem: Coping: ?Goal: Level of anxiety will decrease ?Outcome: Progressing ?  ?Problem: Safety: ?Goal: Ability to remain free from injury will improve ?Outcome: Progressing ?  ?

## 2019-12-30 NOTE — Consult Note (Signed)
Advanced Heart Failure Team Consult Note   Primary Physician: Sueanne Margarita, MD PCP-Cardiologist:  No primary care provider on file.  Reason for Consultation: CHF  HPI:    Corey Palmer is seen today for evaluation of CHF at the request of Dr. Curt Bears.   49 y.o. with history of nonischemic cardiomyopathy and paroxysmal atrial fibrillation.  He has had a nonischemic cardiomyopathy known since the early 2000s.  His father and mother both had CHF and died in their 59s ([employer] gene testing for cardiomyopathy did not show a known genetic variant). He does not drink ETOH or smoke.  He has a Research officer, political party ICD.  LHC in 2002 showed 40% LAD stenosis (nonobstructive).  Last echo in 8/20 from the hospital in [place] showed EF 15% with mildly decreased RV systolic function.  This is lower than in the past, where EF has been in the 25% range.  He has had paroxysmal atrial fibrillation and is on Eliquis.  He has type 2 diabetes and is now on insulin.  He has OSA but does not have CPAP. He was off his cardiac meds for about 2 years but restarted earlier this year.  He was admitted in 8/20 in [place] and again in 9/20 at [facility] with CHF. CPX in 11/20 showed moderate HF limitation.   I have not seen him since a virtual visit early this year, but he reports compliance with his medication regimen.   Patient says that he was feeling normal with minimal exertional dyspnea until yesterday.  Yesterday morning at work, his heart began to race and he developed shortness of breath along with pleuritic chest discomfort. His legs were swollen and tight, he says this started yesterday (?).  He took NTG thinking that it would help with the heart racing, but it just caused a headache.  He came to the ER and was found to be in atrial fibrillation with RVR.  He reported compliance with Eliquis so was cardioverted in the ER back to NSR.  He is in NSR this morning.  He got Lasix 80 mg IV last night due to volume overload.  Today, K is up to 5.9 and creatinine to 2.03 from 1.72.  He did not urinate much.  He is short of breath, orthopneic, and nauseated this morning. Still with some pleuritic/sharp chest pain.   Review of Systems: All systems reviewed and negative except as per HPI.   Home Medications Prior to Admission medications   Medication Sig Start Date End Date Taking? Authorizing Provider  apixaban (ELIQUIS) 5 MG TABS tablet Take 1 tablet (5 mg total) by mouth 2 (two) times daily. 12/02/19  Yes Arta Silence, MD  aspirin 81 MG chewable tablet Chew 324 mg by mouth as needed (for chest pain).   Yes [provider]  busPIRone (BUSPAR) 10 MG tablet Take 1 tablet (10 mg total) by mouth 3 (three) times daily. 12/06/19 12/05/20 Yes Pucilowski, Olgierd A, MD  carvedilol (COREG) 25 MG tablet Take 1 tablet (25 mg total) by mouth 2 (two) times daily. Needs appt Patient taking differently: Take 25 mg by mouth 2 (two) times daily.  09/10/19  Yes Larey Dresser, MD  dapagliflozin propanediol (FARXIGA) 10 MG TABS tablet Take 10 mg by mouth daily before breakfast. 02/22/19  Yes Larey Dresser, MD  dicyclomine (BENTYL) 10 MG capsule Take 10 mg by mouth 3 (three) times daily before meals.   Yes [provider]  digoxin (LANOXIN) 0.125 MG  tablet Take 1 tablet (0.125 mg total) by mouth daily. 02/19/19 02/23/2020 Yes Larey Dresser, MD  divalproex (DEPAKOTE ER) 500 MG 24 hr tablet Take 1 tablet (500 mg total) by mouth at bedtime. 12/06/19 12/05/20 Yes Pucilowski, Olgierd A, MD  FLUoxetine (PROZAC) 20 MG capsule Take 4 capsules (80 mg total) by mouth daily. Patient taking differently: Take 40 mg by mouth 2 (two) times daily.  12/06/19 12/05/20 Yes Pucilowski, Olgierd A, MD  furosemide (LASIX) 20 MG tablet TAKE 2 TABLETS (40 MG TOTAL) BY MOUTH IN THE MORNING AND 1 TABLET (20 MG TOTAL) EVERY EVENING. Patient taking differently: TAKE 2 TABLETS (40 MG TOTAL) BY MOUTH IN THE MORNING AND 1 TABLET (20 MG TOTAL) EVERY EVENING  11/08/19  Yes Larey Dresser, MD  hydrOXYzine (VISTARIL) 50 MG capsule Take 1 capsule (50 mg total) by mouth 3 (three) times daily as needed for anxiety. 12/06/19 12/05/20 Yes Pucilowski, Olgierd A, MD  insulin aspart (NOVOLOG FLEXPEN) 100 UNIT/ML FlexPen Inject 5-50 Units into the skin See admin instructions. Inject 5-50 units into the skin three times a day before meals, PER SLIDING SCALE   Yes [provider]  magnesium oxide (MAG-OX) 400 MG tablet Take 800 mg by mouth daily.    Yes [provider]  omeprazole (PRILOSEC) 40 MG capsule Take 1 capsule (40 mg total) by mouth daily. Patient taking differently: Take 40 mg by mouth 2 (two) times daily before a meal.  08/18/13  Yes Larey Dresser, MD  ondansetron (ZOFRAN) 4 MG tablet Take 4 mg by mouth every 8 (eight) hours as needed for nausea or vomiting.   Yes [provider]  POTASSIUM PO Take 4 tablets by mouth in the morning.   Yes [provider]  promethazine (PHENERGAN) 25 MG tablet Take 25 mg by mouth every 8 (eight) hours as needed for nausea or vomiting.  10/12/19  Yes [provider]  sacubitril-valsartan (ENTRESTO) 49-51 MG Take 1 tablet by mouth 2 (two) times daily. 02/19/19  Yes Larey Dresser, MD  Semaglutide,0.25 or 0.5MG /DOS, (OZEMPIC, 0.25 OR 0.5 MG/DOSE,) 2 MG/1.5ML SOPN Inject 0.5 mg into the skin once a week. Patient taking differently: Inject 0.5 mg into the skin every Sunday.  12/01/19  Yes Renato Shin, MD  spironolactone (ALDACTONE) 25 MG tablet Take 1 tablet (25 mg total) by mouth daily. Needs appt Patient taking differently: Take 25 mg by mouth daily.  06/22/19  Yes Larey Dresser, MD  glucose blood (CONTOUR TEST) test strip 1 each by Other route 2 (two) times daily. And lancets 1/day 02/03/19   Renato Shin, MD  insulin glargine (LANTUS SOLOSTAR) 100 UNIT/ML Solostar Pen Inject 40 Units into the skin every morning. 12/01/19   Renato Shin, MD    Past Medical History: 1. Type  II diabetes with peripheral neuropathy.  2. Hyperlipidemia 3. Atrial fibrillation: Paroxysmal. He was on dofetilide in the past but was not compliant with it.  He was taken off coumadin with upper GI bleed in 3/12 but is now back on apixaban.  4. Upper GI bleed: From antral ulcers, was in 3/12.  5. Gout 6. Dilated cardiomyopathy: Nonischemic.  St Jude ICD in 5784 (complicated by RA perforation and tamponade).  LHC in 2002 with 40% LAD stenosis. Echo (6/13) with EF 20%, akinetic anteroseptum, moderate MR, severe TR, moderately dilated RV.  - Echo 11/10: EF 25-30%.  - Echo 8/12: EF 20-25% - Echo 4/15: EF 20-25% - Echo 8/20: EF 15%, diffuse hypokinesis,  mildly decreased RV systolic function, mild-moderate MR.  - CPX (11/20): peak VO2 17.1, VE/VCO2 slope 35, RER 1.10, moderate HF limitation, obesity contributes. - [employer] gene testing was negative for common dilated cardiomyopathy mutations.  7. CAD: LHC 2002 with 40% LAD.  8. OSA  Past Surgical History: Past Surgical History:  Procedure Laterality Date  . BIOPSY  12/01/2019   Procedure: BIOPSY;  Surgeon: Arta Silence, MD;  Location: [facility] ENDOSCOPY;  Service: Endoscopy;;  . CARDIAC CATHETERIZATION    . CARDIAC PACEMAKER PLACEMENT    . CHOLECYSTECTOMY    . COLONOSCOPY    . CORONARY ARTERY BYPASS GRAFT    . ESOPHAGOGASTRODUODENOSCOPY  04/05/2011   Procedure: ESOPHAGOGASTRODUODENOSCOPY (EGD);  Surgeon: Irene Shipper, MD;  Location: [facility] ENDOSCOPY;  Service: Endoscopy;  Laterality: N/A;  . ESOPHAGOGASTRODUODENOSCOPY (EGD) WITH PROPOFOL N/A 12/01/2019   Procedure: ESOPHAGOGASTRODUODENOSCOPY (EGD) WITH PROPOFOL;  Surgeon: Arta Silence, MD;  Location: [facility] ENDOSCOPY;  Service: Endoscopy;  Laterality: N/A;  . JOINT REPLACEMENT     left knee replacement  . KNEE ARTHROSCOPY Right 06/11/2013   Procedure: RIGHT KNEE ARTHROSCOPY with chondroplasty;  Surgeon: Johnny Bridge, MD;  Location: [facility];  Service: Orthopedics;  Laterality: Right;  . TOTAL KNEE  ARTHROPLASTY Left     Family History: Family History  Problem Relation Age of Onset  . Diabetes Mother   . Coronary artery disease Mother 62  . Gout Mother   . Heart disease Mother   . Heart attack Mother 61  . Diabetes Father   . Coronary artery disease Father 25       Died in a house fire   . Heart disease Father   . Heart disease Brother     Social History: Social History   Socioeconomic History  . Marital status: Married    Spouse name: Not on file  . Number of children: 3  . Years of education: college  . Highest education level: Not on file  Occupational History  . Occupation: bar tender  Tobacco Use  . Smoking status: Never Smoker  . Smokeless tobacco: Never Used  Vaping Use  . Vaping Use: Never used  Substance and Sexual Activity  . Alcohol use: No  . Drug use: No  . Sexual activity: Yes  Other Topics Concern  . Not on file  Social History Narrative   Lives in [place].   Studied sports medicine in college, got a BS - used to work with the [employer].         Social Determinants of Health   Financial Resource Strain:   . Difficulty of Paying Living Expenses: Not on file  Food Insecurity:   . Worried About Charity fundraiser in the Last Year: Not on file  . Ran Out of Food in the Last Year: Not on file  Transportation Needs:   . Lack of Transportation (Medical): Not on file  . Lack of Transportation (Non-Medical): Not on file  Physical Activity:   . Days of Exercise per Week: Not on file  . Minutes of Exercise per Session: Not on file  Stress:   . Feeling of Stress : Not on file  Social Connections:   . Frequency of Communication with Friends and Family: Not on file  . Frequency of Social Gatherings with Friends and Family: Not on file  . Attends Religious Services: Not on file  . Active Member of Clubs or Organizations: Not on file  . Attends Archivist Meetings: Not on file  .  Marital Status: Not on file    Allergies:   Allergies  Allergen Reactions  . Orange Fruit Anaphylaxis, Hives and Other (See Comments)    Per Pt- Blisters around lips and Hives all over, also  . Penicillins Anaphylaxis    Did it involve swelling of the face/tongue/throat, SOB, or low BP? Yes Did it involve sudden or severe rash/hives, skin peeling, or any reaction on the inside of your mouth or nose? Yes Did you need to seek medical attention at a hospital or doctor's office? No When did it last happen?childhood If all above answers are "NO", may proceed with cephalosporin use.  [name] [Empagliflozin] Itching  . Basaglar Kwikpen [Insulin Glargine] Nausea And Vomiting    Objective:    Vital Signs:   Temp:  [97.5 F (36.4 C)-98 F (36.7 C)] 97.5 F (36.4 C) (12/02 0909) Pulse Rate:  [60-163] 91 (12/02 0909) Resp:  [13-32] 18 (12/02 0909) BP: (119-155)/(90-127) 120/90 (12/02 0909) SpO2:  [81 %-100 %] 96 % (12/02 0909) Weight:  [119.4 kg-122.9 kg] 119.4 kg (12/02 0909)    Weight change: Filed Weights   01/19/2020 1509 12/30/19 0909  Weight: 122.9 kg 119.4 kg    Intake/Output:   Intake/Output Summary (Last 24 hours) at 12/30/2019 1025 Last data filed at 12/30/2019 1000 Gross per 24 hour  Intake --  Output 1400 ml  Net -1400 ml      Physical Exam    General:  Well appearing. No resp difficulty HEENT: normal Neck: supple. JVP 16 cm. Carotids 2+ bilat; no bruits. No lymphadenopathy or thyromegaly appreciated. Cor: PMI lateral. Regular rate & rhythm. No rubs, gallops. 2/6 HSM apex.  Lungs: clear Abdomen: soft, nontender, nondistended. No hepatosplenomegaly. No bruits or masses. Good bowel sounds. Extremities: no cyanosis, clubbing, rash. 2+ edema to thighs.  Neuro: alert & orientedx3, cranial nerves grossly intact. moves all 4 extremities w/o difficulty. Affect pleasant   Telemetry   NSR 80s, personally reviewed.   EKG    ST 116, poor RWP (poor RWP seen on prior ECG), personally reviewed.   Labs    Basic Metabolic Panel: Recent Labs  Lab 01/06/2020 1544 01/25/2020 1831 12/30/19 0357  NA 130*  --  129*  K 4.9  --  5.6*  CL 96*  --  95*  CO2 21*  --  19*  GLUCOSE 143*  --  122*  BUN 31*  --  39*  CREATININE 1.72*  --  2.03*  CALCIUM 8.9  --  8.4*  MG  --  1.7 1.6*    Liver Function Tests: No results for input(s): AST, ALT, ALKPHOS, BILITOT, PROT, ALBUMIN in the last 168 hours. No results for input(s): LIPASE, AMYLASE in the last 168 hours. No results for input(s): AMMONIA in the last 168 hours.  CBC: Recent Labs  Lab 01/13/2020 1544 12/30/19 0357  WBC 7.3 8.4  HGB 16.1 15.5  HCT 49.1 49.4  MCV 82.7 84.4  PLT 222 199    Cardiac Enzymes: No results for input(s): CKTOTAL, CKMB, CKMBINDEX, TROPONINI in the last 168 hours.  BNP: BNP (last 3 results) Recent Labs    01/04/2020 1619  BNP 577.5*    ProBNP (last 3 results) No results for input(s): PROBNP in the last 8760 hours.   CBG: Recent Labs  Lab 01/28/2020 2252 12/30/19 0747  GLUCAP 86 126*    Coagulation Studies: No results for input(s): LABPROT, INR in the last 72 hours.   Imaging   DG Chest 2 View  Result Date: 01/16/2020 CLINICAL DATA:  Pt states his defibrillator fired 2 times today - also took 2 nitro pills - still having the chest pain and increased HR, also some SOB - hx of CHF, htn, diabetes, AFIB, sleep apnea, GERD, benign stomach cancer EXAM: CHEST - 2 VIEW COMPARISON:  10/01/2018 and older exams. FINDINGS: Mild to moderate enlargement of the cardiopericardial silhouette, stable. No mediastinal or hilar masses. Left anterior chest wall single lead AICD is unchanged, tip projecting over the right ventricle. Stable changes from a previous median sternotomy. Lungs are clear. No convincing pleural effusion and no pneumothorax. Skeletal structures are grossly intact. IMPRESSION: 1. No acute cardiopulmonary disease. 2. Stable cardiomegaly. No change in the position of the left anterior chest wall AICD.  Electronically Signed   By: Lajean Manes M.D.   On: 01/12/2020 16:20   [place] EKG SITE RITE  Result Date: 12/30/2019 If Site Rite image not attached, placement could not be confirmed due to current cardiac rhythm.     Medications:     Current Medications: . apixaban  5 mg Oral BID  . atorvastatin  80 mg Oral Daily  . busPIRone  10 mg Oral TID  . carvedilol  12.5 mg Oral BID WC  . dicyclomine  10 mg Oral TID AC  . divalproex  500 mg Oral QHS  . FLUoxetine  40 mg Oral BID  . [START ON 12/31/2019] influenza vac split quadrivalent PF  0.5 mL Intramuscular Tomorrow-1000  . insulin aspart  0-15 Units Subcutaneous TID WC  . insulin aspart  0-5 Units Subcutaneous QHS  . sodium chloride flush  3 mL Intravenous Q12H  . sodium zirconium cyclosilicate  10 g Oral Once     Infusions: . sodium chloride    . magnesium sulfate bolus IVPB        Assessment/Plan   1. Atrial fibrillation: H/o PAF.  He was on dofetilide in the remote past but this was stopped due to noncompliance.  He had an upper GI bleed from antral ulcers in 3/12. He was seen by GI and was deemed safe to restart anticoagulation as long as he remains on a PPI.  No apparent recurrence of AF until this yesterday, was cardioverted back to NSR in ER and remains in NSR currently.  - Continue apixaban 5 mg bid.   - If milrinone is started, would use amiodarone while on milrinone to maintain NSR (can stop when off).  2. CAD: Nonobstructive mild CAD on prior cath. Continue atorvastatin.  He has had chest pain but is pleuritic.  HS-TnI 64 => 92, suspect demand ischemia with volume overload and afib/RVR.   - Will check one more troponin.  - Continue atorvastatin.  3. Acute on chronic systolic CHF: Nonischemic cardiomyopathy.  Possible familial cardiomyopathy as both parents had cardiomyopathy and died at around 37. However, [employer] gene testing did not show any common mutation for cardiomyopathy. St Jude ICD.  Last echo in 8/20 with EF 15%  and mildly decreased RV function.  Despite markedly low EF, he reports minimal symptoms up until yesterday when he said the peripheral edema and dyspnea/orthopnea began.  He appears very volume overloaded on exam to me, do not think this accumulated over the last day.  Suspect he has had progressive HF with eventual triggering of atrial fibrillation that made him feel acutely worse.  With rise in K and creatinine overnight, I am concerned for low output HF/cardiorenal syndrome.  - Hold digoxin, dapagliflozin, Entresto, spironolactone with AKI.    -  Needs repeat echo today.  - Continue Coreg 12.5 mg bid.  - Will place PICC line as soon as possible, check co-ox.  If low (have suspicion it will be), will start milrinone 0.25 mcg/kg/min to facilitate diuresis.  - Follow CVP off PICC, will not give additional diuretic until we see if he will need milrinone.  - Unna boots.  - He would likely be an advanced therapies candidate.  4. OSA: OSA on sleep study, has not been using CPAP.  5. AKI on CKD stage ?3: Last creatinine prior to admission was 1.38.  Rise in creatinine overnight with hyperkalemia.  ?Cardiorenal syndrome.  - Lokelma for K.  - Repeat BMET in pm.  - Placing PICC as above, concerned he will need milrinone.   Length of Stay: 0  Loralie Champagne, MD  12/30/2019, 10:25 AM  Advanced Heart Failure Team Pager [phone] (M-F; 7a - 4p)  Please contact [facility] for night-coverage after hours (4p -7a ) and weekends on [email or web address]

## 2019-12-30 NOTE — Progress Notes (Addendum)
Pt sated having chest pain 7/10. NTG SL given x2. Pt verbalizes not having any more chest pain. tylenol given for headache.  CVP monitoring in place. BMP and trop labs collected currently waiting for results. Bilateral unna boots in place. [facility] aware. Pt's family member at bedside.

## 2019-12-30 NOTE — Progress Notes (Signed)
CRITICAL VALUE ALERT  Critical Value: Troponin 110  Date & Time Notied:  12/30/2019 1159  Provider Notified: Rosita Fire PA

## 2019-12-30 NOTE — TOC Progression Note (Signed)
Transition of Care [facility]) - Progression Note    Patient Details  Name: Corey Palmer MRN: [id number] Date of Birth: 02/10/1970  [facility]) CM/SW Contact  Zenon Mayo, RN Phone Number: 12/30/2019, 9:44 AM  Clinical Narrative:    NCM spoke with patient, he states he lives with spouse , he is indep, he does not want any HH services.         Expected Discharge Plan and Services                                                 Social Determinants of Health (SDOH) Interventions    Readmission Risk Interventions No flowsheet data found.

## 2019-12-30 NOTE — Progress Notes (Signed)
Co-ox collected and tubed to lab.

## 2019-12-30 NOTE — Progress Notes (Signed)
Orthopedic Tech Progress Note Patient Details:  Corey Palmer 1970/05/15 [id number]  Ortho Devices Type of Ortho Device: Louretta Parma boot Ortho Device/Splint Location: BLE Ortho Device/Splint Interventions: Ordered, Application   Post Interventions Patient Tolerated: Well Instructions Provided: Care of device, Poper ambulation with device   [name] 12/30/2019, 2:53 PM

## 2019-12-31 DIAGNOSIS — I4891 Unspecified atrial fibrillation: Secondary | ICD-10-CM | POA: Diagnosis not present

## 2019-12-31 DIAGNOSIS — I5043 Acute on chronic combined systolic (congestive) and diastolic (congestive) heart failure: Secondary | ICD-10-CM | POA: Diagnosis not present

## 2019-12-31 LAB — CBC
HCT: 45.7 % (ref 39.0–52.0)
Hemoglobin: 14.8 g/dL (ref 13.0–17.0)
MCH: 26.5 pg (ref 26.0–34.0)
MCHC: 32.4 g/dL (ref 30.0–36.0)
MCV: 81.9 fL (ref 80.0–100.0)
Platelets: 165 10*3/uL (ref 150–400)
RBC: 5.58 MIL/uL (ref 4.22–5.81)
RDW: 15.7 % — ABNORMAL HIGH (ref 11.5–15.5)
WBC: 4.8 10*3/uL (ref 4.0–10.5)
nRBC: 0 % (ref 0.0–0.2)

## 2019-12-31 LAB — COOXEMETRY PANEL
Carboxyhemoglobin: 1.3 % (ref 0.5–1.5)
Methemoglobin: 0.8 % (ref 0.0–1.5)
O2 Saturation: 64.1 %
Total hemoglobin: 14.9 g/dL (ref 12.0–16.0)

## 2019-12-31 LAB — TYPE AND SCREEN
ABO/RH(D): A POS
Antibody Screen: NEGATIVE

## 2019-12-31 LAB — BASIC METABOLIC PANEL
Anion gap: 12 (ref 5–15)
BUN: 49 mg/dL — ABNORMAL HIGH (ref 6–20)
CO2: 23 mmol/L (ref 22–32)
Calcium: 8.3 mg/dL — ABNORMAL LOW (ref 8.9–10.3)
Chloride: 96 mmol/L — ABNORMAL LOW (ref 98–111)
Creatinine, Ser: 2.22 mg/dL — ABNORMAL HIGH (ref 0.61–1.24)
GFR, Estimated: 35 mL/min — ABNORMAL LOW (ref >60)
Glucose, Bld: 137 mg/dL — ABNORMAL HIGH (ref 70–99)
Potassium: 3.6 mmol/L (ref 3.5–5.1)
Sodium: 131 mmol/L — ABNORMAL LOW (ref 135–145)

## 2019-12-31 LAB — MAGNESIUM: Magnesium: 1.9 mg/dL (ref 1.7–2.4)

## 2019-12-31 LAB — GLUCOSE, CAPILLARY
Glucose-Capillary: 127 mg/dL — ABNORMAL HIGH (ref 70–99)
Glucose-Capillary: 218 mg/dL — ABNORMAL HIGH (ref 70–99)
Glucose-Capillary: 143 mg/dL — ABNORMAL HIGH (ref 70–99)
Glucose-Capillary: 192 mg/dL — ABNORMAL HIGH (ref 70–99)

## 2019-12-31 LAB — ABO/RH: ABO/RH(D): A POS

## 2019-12-31 MED ORDER — CARVEDILOL 6.25 MG PO TABS
6.2500 mg | ORAL_TABLET | Freq: Two times a day (BID) | ORAL | Status: DC
  Administered 2019-12-31 – 2020-01-01 (×2): 6.25 mg via ORAL
  Filled 2019-12-31 (×2): qty 1

## 2019-12-31 MED ORDER — POTASSIUM CHLORIDE CRYS ER 20 MEQ PO TBCR
60.0000 meq | EXTENDED_RELEASE_TABLET | Freq: Once | ORAL | Status: AC
  Administered 2019-12-31: 60 meq via ORAL
  Filled 2019-12-31: qty 3

## 2019-12-31 MED ORDER — SODIUM CHLORIDE 0.9% IV SOLUTION: Freq: Once | INTRAVENOUS | Status: DC

## 2019-12-31 MED ORDER — MAGNESIUM SULFATE 2 GM/50ML IV SOLN
2.0000 g | Freq: Once | INTRAVENOUS | Status: AC
  Administered 2019-12-31: 2 g via INTRAVENOUS
  Filled 2019-12-31: qty 50

## 2019-12-31 NOTE — Plan of Care (Signed)
  Problem: Clinical Measurements: Goal: Diagnostic test results will improve Outcome: Progressing Goal: Respiratory complications will improve Outcome: Progressing   Problem: Activity: Goal: Risk for activity intolerance will decrease Outcome: Progressing   

## 2019-12-31 NOTE — Progress Notes (Addendum)
Advanced Heart Failure Rounding Note  PCP-Cardiologist: No primary care provider on file.   Subjective:    Yesterday started on milrinone for low CO-OX and started on lasix drip at 10 mg per hour.   Maintaining NSR. On amio drip + eliquis.   Todays CO-OX improved to 64% on milrinone 0.25 mcg.   Denies SOB. No chest pain.    Objective:   Weight Range: 119.4 kg Body mass index is 32.91 kg/m.   Vital Signs:   Temp:  [97.2 F (36.2 C)-98 F (36.7 C)] 97.2 F (36.2 C) (12/03 0600) Pulse Rate:  [72-99] 78 (12/03 0600) Resp:  [16-18] 16 (12/03 0600) BP: (102-126)/(80-95) 121/89 (12/03 0600) SpO2:  [91 %-96 %] 91 % (12/03 0600) Weight:  [119.4 kg] 119.4 kg (12/03 0600) Last BM Date: 12/30/19  Weight change: Filed Weights   01/14/2020 1509 12/30/19 0909 12/31/19 0600  Weight: 122.9 kg 119.4 kg 119.4 kg    Intake/Output:   Intake/Output Summary (Last 24 hours) at 12/31/2019 0821 Last data filed at 12/31/2019 0600 Gross per 24 hour  Intake 914.72 ml  Output 2000 ml  Net -1085.28 ml      Physical Exam   CVP 18. General:  No resp difficulty HEENT: Normal Neck: Supple. JVP to jaw . Carotids 2+ bilat; no bruits. No lymphadenopathy or thyromegaly appreciated. Cor: PMI nondisplaced. Regular rate & rhythm. No rubs, gallops or murmurs. Lungs: Clear Abdomen: Soft, nontender, nondistended. No hepatosplenomegaly. No bruits or masses. Good bowel sounds. Extremities: No cyanosis, clubbing, rash, R and LLE 2-3+ edema with unna boots on. RUE PICC Neuro: Alert & orientedx3, cranial nerves grossly intact. moves all 4 extremities w/o difficulty. Affect pleasant   Telemetry   SR with PACs/PVCs   EKG   n/a  Labs    CBC Recent Labs    12/30/19 0357 12/31/19 0433  WBC 8.4 4.8  HGB 15.5 14.8  HCT 49.4 45.7  MCV 84.4 81.9  PLT 199 373   Basic Metabolic Panel Recent Labs    12/30/19 0357 12/30/19 0357 12/30/19 1316 12/31/19 0433  NA 129*   < > 129* 131*  K 5.6*    < > 4.8 3.6  CL 95*   < > 95* 96*  CO2 19*   < > 22 23  GLUCOSE 122*   < > 167* 137*  BUN 39*   < > 42* 49*  CREATININE 2.03*   < > 2.20* 2.22*  CALCIUM 8.4*   < > 8.5* 8.3*  MG 1.6*  --   --  1.9   < > = values in this interval not displayed.   Liver Function Tests No results for input(s): AST, ALT, ALKPHOS, BILITOT, PROT, ALBUMIN in the last 72 hours. No results for input(s): LIPASE, AMYLASE in the last 72 hours. Cardiac Enzymes No results for input(s): CKTOTAL, CKMB, CKMBINDEX, TROPONINI in the last 72 hours.  BNP: BNP (last 3 results) Recent Labs    01/16/2020 1619  BNP 577.5*    ProBNP (last 3 results) No results for input(s): PROBNP in the last 8760 hours.   D-Dimer No results for input(s): DDIMER in the last 72 hours. Hemoglobin A1C No results for input(s): HGBA1C in the last 72 hours. Fasting Lipid Panel No results for input(s): CHOL, HDL, LDLCALC, TRIG, CHOLHDL, LDLDIRECT in the last 72 hours. Thyroid Function Tests No results for input(s): TSH, T4TOTAL, T3FREE, THYROIDAB in the last 72 hours.  Invalid input(s): FREET3  Other results:   Imaging  ECHOCARDIOGRAM COMPLETE  Result Date: 12/30/2019    ECHOCARDIOGRAM REPORT   [name] Date of Exam: 12/30/2019 Medical Rec #:  [id number]     Height:       75.0 in Accession #:    [id number]    Weight:       263.2 lb Date of Birth:  12-Jan-1971     BSA:          2.466 m Patient Age:    64 years      BP:           120/90 mmHg Patient Gender: M             HR:           88 bpm. Exam Location:  Inpatient Procedure: 2D Echo, Color Doppler, Cardiac Doppler and Intracardiac            Opacification Agent Indications:    J94.17 Acute systolic (congestive) heart failure  History:        Patient has prior history of Echocardiogram examinations, most                 recent 08/31/2018. CHF, Defibrillator, Arrythmias:Atrial                 Fibrillation; Risk Factors:Hypertension, Diabetes and                  Dyslipidemia. Prior performed at [facility]  Sonographer:    Raquel Sarna Senior RDCS Referring Phys: [name]  1. Left ventricular ejection fraction, by estimation, is <20%. The left ventricle has severely decreased function. The left ventricle demonstrates global hypokinesis. The left ventricular internal cavity size was severely dilated. Left ventricular diastolic parameters are consistent with Grade III diastolic dysfunction (restrictive). Elevated left atrial pressure.  2. Right ventricular systolic function is normal. The right ventricular size is normal. Tricuspid regurgitation signal is inadequate for assessing PA pressure.  3. Left atrial size was severely dilated.  4. Right atrial size was mildly dilated.  5. The mitral valve is normal in structure. No evidence of mitral valve regurgitation. No evidence of mitral stenosis.  6. The aortic valve is tricuspid. Aortic valve regurgitation is not visualized. No aortic stenosis is present.  7. The inferior vena cava is dilated in size with <50% respiratory variability, suggesting right atrial pressure of 15 mmHg. FINDINGS  Left Ventricle: Left ventricular ejection fraction, by estimation, is <20%. The left ventricle has severely decreased function. The left ventricle demonstrates global hypokinesis. Definity contrast agent was given IV to delineate the left ventricular endocardial borders. The left ventricular internal cavity size was severely dilated. There is no left ventricular hypertrophy. Left ventricular diastolic parameters are consistent with Grade III diastolic dysfunction (restrictive). Elevated left atrial pressure. Right Ventricle: The right ventricular size is normal.Right ventricular systolic function is normal. Tricuspid regurgitation signal is inadequate for assessing PA pressure. The tricuspid regurgitant velocity is 2.17 m/s, and with an assumed right atrial pressure of 15 mmHg, the estimated right ventricular systolic pressure  is 40.8 mmHg. Left Atrium: Left atrial size was severely dilated. Right Atrium: Right atrial size was mildly dilated. Pericardium: There is no evidence of pericardial effusion. Mitral Valve: The mitral valve is normal in structure. No evidence of mitral valve regurgitation. No evidence of mitral valve stenosis. Tricuspid Valve: The tricuspid valve is normal in structure. Tricuspid valve regurgitation is mild . No evidence of tricuspid stenosis. Aortic Valve: The aortic valve  is tricuspid. Aortic valve regurgitation is not visualized. No aortic stenosis is present. Pulmonic Valve: The pulmonic valve was normal in structure. Pulmonic valve regurgitation is not visualized. No evidence of pulmonic stenosis. Aorta: The aortic root is normal in size and structure. Venous: The inferior vena cava is dilated in size with less than 50% respiratory variability, suggesting right atrial pressure of 15 mmHg.  Additional Comments: A pacer wire is visualized.  LEFT VENTRICLE PLAX 2D LVIDd:         7.20 cm  Diastology LVIDs:         6.90 cm  LV e' lateral:   4.32 cm/s LV PW:         0.90 cm  LV E/e' lateral: 19.1 LV IVS:        0.60 cm LVOT diam:     2.10 cm LV SV:         15 LV SV Index:   6 LVOT Area:     3.46 cm  RIGHT VENTRICLE RV S prime:     4.79 cm/s TAPSE (M-mode): 0.9 cm LEFT ATRIUM              Index       RIGHT ATRIUM           Index LA diam:        5.70 cm  2.31 cm/m  RA Area:     21.70 cm LA Vol (A2C):   79.5 ml  32.24 ml/m RA Volume:   70.00 ml  28.38 ml/m LA Vol (A4C):   125.0 ml 50.69 ml/m LA Biplane Vol: 104.0 ml 42.17 ml/m  AORTIC VALVE LVOT Vmax:   33.90 cm/s LVOT Vmean:  24.400 cm/s LVOT VTI:    0.043 m  AORTA Ao Root diam: 3.00 cm Ao Asc diam:  2.80 cm MITRAL VALVE               TRICUSPID VALVE MV Area (PHT): 5.27 cm    TR Peak grad:   18.8 mmHg MV Decel Time: 144 msec    TR Vmax:        217.00 cm/s MV E velocity: 82.70 cm/s                            SHUNTS                            Systemic VTI:  0.04  m                            Systemic Diam: 2.10 cm Kirk Ruths MD Electronically signed by Kirk Ruths MD Signature Date/Time: 12/30/2019/2:31:39 PM    Final    [place] EKG SITE RITE  Result Date: 12/30/2019 If Site Rite image not attached, placement could not be confirmed due to current cardiac rhythm.     Medications:     Scheduled Medications: . apixaban  5 mg Oral BID  . atorvastatin  80 mg Oral Daily  . busPIRone  10 mg Oral TID  . carvedilol  12.5 mg Oral BID WC  . Chlorhexidine Gluconate Cloth  6 each Topical Daily  . dicyclomine  10 mg Oral TID AC  . divalproex  500 mg Oral QHS  . FLUoxetine  40 mg Oral BID  . influenza vac split quadrivalent PF  0.5 mL Intramuscular Tomorrow-1000  . insulin  aspart  0-15 Units Subcutaneous TID WC  . insulin aspart  0-5 Units Subcutaneous QHS  . sodium chloride flush  10-40 mL Intracatheter Q12H  . sodium chloride flush  3 mL Intravenous Q12H     Infusions: . sodium chloride    . amiodarone 30 mg/hr (12/30/19 2116)  . furosemide (LASIX) 200 mg in dextrose 5% 100 mL (2mg /mL) infusion 10 mg/hr (12/30/19 1648)  . milrinone 0.25 mcg/kg/min (12/31/19 0151)     PRN Medications:  sodium chloride, acetaminophen, ondansetron (ZOFRAN) IV, sodium chloride flush, sodium chloride flush, traMADol     Assessment/Plan  1. Atrial fibrillation: H/o PAF.  He was on dofetilide in the remote past but this was stopped due to noncompliance. He had an upper GI bleed from antral ulcers in 3/12. He was seen by GI and was deemed safe to restart anticoagulation as long as he remains on a PPI.  No apparent recurrence of AF until this yesterday, was cardioverted back to NSR in ER.  Maintaining NSR. Continue amio drip 30 mg per hour.   - Continue apixaban 5 mg bid.   2. CAD: Nonobstructive mild CAD on prior cath. Continue atorvastatin.  He has had chest pain but is pleuritic.  HS-TnI 64 => 92=>110 , suspect demand ischemia with volume overload .  - No  chest pain.  -  Continue atorvastatin.   3. Acute on chronic systolic CHF: Nonischemic cardiomyopathy. Possible familial cardiomyopathy as both parents had cardiomyopathy and died at around 14.However, [employer] gene testing did not show any common mutation for cardiomyopathy.[employer] Jude ICD. Last echo in 8/20 with EF 15% and mildly decreased RV function. Despite markedly low EF, he reports minimal symptoms up until yesterday when he said the peripheral edema and dyspnea/orthopnea began.  Suspect he has had progressive HF with eventual triggering of atrial fibrillation that made him feel acutely worse.   - Echo repeated and showed severely reduced EF < 20% RV normal.  - Yesterday CO-OX low (<30%) so milrinone started. Today CO-OX is 64%.  - Cut back coreg with low output to 6.25 mg twice a day. Home dose 12.5 mg twice a day.   - Marked volume overload. Increase lasix drip to 15 mg per hour.  - Hold digoxin, dapagliflozin, Entresto, spironolactone with AKI.  - He would likely be an advanced therapies candidate.   4. OSA: OSA on sleep study, has not been using CPAP.   5. AKI on CKD stage ?3: Last creatinine prior to admission was 1.38.  Rise in creatinine overnight with hyperkalemia.  ?Cardiorenal syndrome.  Todays Creatinine down a little from 2.2>2.  6. Hyperkalemia Received  Lokelma for K. Todays K 3.6   May need advanced therapies. Will need RHC once diuresed. Will need to continue amio drip while on milrinone.   Length of Stay: 1  Amy Clegg, NP  12/31/2019, 8:21 AM  Advanced Heart Failure Team Pager [phone] (M-F; 7a - 4p)  Please contact [facility] for night-coverage after hours (4p -7a ) and weekends on [email or web address]  Patient seen with NP, agree with the above note.   I/Os -1985 net yesterday after starting milrinone gtt and Lasix gtt (very low co-ox).  This morning, co-ox 64%.  CVP 18, still volume overloaded.  He does feel better today.  He remains in NSR on amiodarone gtt.    I reviewed his echo, EF < 20%, restrictive diastolic function, relatively normal RV.   General: NAD Neck: Thick, JVP 16, no thyromegaly or thyroid nodule.  Lungs: Crackles at bases.  CV: Nondisplaced PMI.  Heart regular S1/S2, no S3/S4, no murmur.  2+ edema to knees.   Abdomen: Soft, nontender, no hepatosplenomegaly, no distention.  Skin: Intact without lesions or rashes.  Neurologic: Alert and oriented x 3.  Psych: Normal affect. Extremities: No clubbing or cyanosis.  HEENT: Normal.   He remains in NSR, continue amiodarone gtt while on milrinone.  He is on Eliquis.   Low output HF with markedly low EF.  He has a long history of cardiomyopathy (20 yrs), tends to minimize symptoms.  Cardiorenal syndrome with creatinine up to 2.2, now stabilized on milrinone.  Co-ox up to 64% today, feels better but CVP still 18.  - Continue milrinone 0.25 - Continue Lasix gtt 15 mg/hr.  - I am concerned about his trajectory long-term. He may be nearing the need for advanced therapies.  Think he would be a potential LVAD or transplant candidate if creatinine settles out.  BMI is about 33 so not markedly high.  Will check blood type.  When renal function is stabilized, will need right/left heart cath (no cath since 2002).   Loralie Champagne 12/31/2019 12:44 PM

## 2020-01-01 ENCOUNTER — Other Ambulatory Visit: Payer: Self-pay

## 2020-01-01 DIAGNOSIS — I4891 Unspecified atrial fibrillation: Secondary | ICD-10-CM | POA: Diagnosis not present

## 2020-01-01 DIAGNOSIS — I5023 Acute on chronic systolic (congestive) heart failure: Secondary | ICD-10-CM | POA: Diagnosis not present

## 2020-01-01 LAB — BASIC METABOLIC PANEL
Anion gap: 12 (ref 5–15)
BUN: 52 mg/dL — ABNORMAL HIGH (ref 6–20)
CO2: 24 mmol/L (ref 22–32)
Calcium: 8 mg/dL — ABNORMAL LOW (ref 8.9–10.3)
Chloride: 95 mmol/L — ABNORMAL LOW (ref 98–111)
Creatinine, Ser: 2.08 mg/dL — ABNORMAL HIGH (ref 0.61–1.24)
GFR, Estimated: 38 mL/min — ABNORMAL LOW (ref >60)
Glucose, Bld: 170 mg/dL — ABNORMAL HIGH (ref 70–99)
Potassium: 3.7 mmol/L (ref 3.5–5.1)
Sodium: 131 mmol/L — ABNORMAL LOW (ref 135–145)

## 2020-01-01 LAB — CBC
HCT: 37.1 % — ABNORMAL LOW (ref 39.0–52.0)
Hemoglobin: 12 g/dL — ABNORMAL LOW (ref 13.0–17.0)
MCH: 26.8 pg (ref 26.0–34.0)
MCHC: 32.3 g/dL (ref 30.0–36.0)
MCV: 83 fL (ref 80.0–100.0)
Platelets: 143 10*3/uL — ABNORMAL LOW (ref 150–400)
RBC: 4.47 MIL/uL (ref 4.22–5.81)
RDW: 15.7 % — ABNORMAL HIGH (ref 11.5–15.5)
WBC: 5.8 10*3/uL (ref 4.0–10.5)
nRBC: 0 % (ref 0.0–0.2)

## 2020-01-01 LAB — GLUCOSE, CAPILLARY
Glucose-Capillary: 169 mg/dL — ABNORMAL HIGH (ref 70–99)
Glucose-Capillary: 210 mg/dL — ABNORMAL HIGH (ref 70–99)
Glucose-Capillary: 128 mg/dL — ABNORMAL HIGH (ref 70–99)
Glucose-Capillary: 175 mg/dL — ABNORMAL HIGH (ref 70–99)

## 2020-01-01 LAB — MAGNESIUM: Magnesium: 1.7 mg/dL (ref 1.7–2.4)

## 2020-01-01 LAB — COOXEMETRY PANEL
Carboxyhemoglobin: 1.1 % (ref 0.5–1.5)
Methemoglobin: 0.7 % (ref 0.0–1.5)
O2 Saturation: 59.9 %
Total hemoglobin: 14.6 g/dL (ref 12.0–16.0)

## 2020-01-01 MED ORDER — POTASSIUM CHLORIDE CRYS ER 10 MEQ PO TBCR
20.0000 meq | EXTENDED_RELEASE_TABLET | Freq: Two times a day (BID) | ORAL | Status: DC
  Administered 2020-01-01 – 2020-01-02 (×2): 20 meq via ORAL
  Filled 2020-01-01 (×4): qty 2

## 2020-01-01 NOTE — Progress Notes (Signed)
Pt. Had 6 beat run of VTach. VSS, EKG completed. No distress noted. Pt. Denies pain or discomfort. On call for cardiology paged to make aware.

## 2020-01-01 NOTE — Progress Notes (Signed)
Advanced Heart Failure Rounding Note  PCP-Cardiologist: No primary care provider on file.   Subjective:    Started on milrinone 12/2 for low CO-OX and started on lasix drip at 10 mg per hour. CVP 16  Back in AF. Rate controlled. On amio drip + eliquis.   Todays CO-OX 60% on milrinone 0.25 mcg.   Diuresing briskly on lasix gtt. Out 3.2L but weight unchanged.  Denies CP, SOB, orthopnea or PND.    Objective:   Weight Range: 119.9 kg Body mass index is 33.04 kg/m.   Vital Signs:   Temp:  [97.1 F (36.2 C)-97.6 F (36.4 C)] 97.6 F (36.4 C) (12/04 0300) Pulse Rate:  [79-86] 84 (12/04 0800) Resp:  [16-18] 17 (12/04 0300) BP: (106-129)/(74-90) 127/90 (12/04 0800) SpO2:  [91 %-96 %] 92 % (12/04 0800) Weight:  [119.9 kg] 119.9 kg (12/04 0300) Last BM Date: 12/30/19  Weight change: Filed Weights   12/30/19 0909 12/31/19 0600 01/01/20 0300  Weight: 119.4 kg 119.4 kg 119.9 kg    Intake/Output:   Intake/Output Summary (Last 24 hours) at 01/01/2020 1447 Last data filed at 01/01/2020 1400 Gross per 24 hour  Intake 1508.86 ml  Output 3750 ml  Net -2241.14 ml      Physical Exam   General:  Sitting up in bed No resp difficulty HEENT: normal Neck: supple. JVP to ear  Carotids 2+ bilat; no bruits. No lymphadenopathy or thryomegaly appreciated. Cor: PMI nondisplaced. Irregular rate & rhythm. No rubs, gallops or murmurs. Lungs: clear Abdomen: obese soft, nontender, nondistended. No hepatosplenomegaly. No bruits or masses. Good bowel sounds. Extremities: no cyanosis, clubbing, rash, 2-3+ edema Neuro: alert & orientedx3, cranial nerves grossly intact. moves all 4 extremities w/o difficulty. Affect pleasant   Telemetry   AF 80-90s Personally reviewed   Labs    CBC Recent Labs    12/31/19 0433 01/01/20 0558  WBC 4.8 5.8  HGB 14.8 12.0*  HCT 45.7 37.1*  MCV 81.9 83.0  PLT 165 599*   Basic Metabolic Panel Recent Labs    12/31/19 0433 01/01/20 0558  NA 131*  131*  K 3.6 3.7  CL 96* 95*  CO2 23 24  GLUCOSE 137* 170*  BUN 49* 52*  CREATININE 2.22* 2.08*  CALCIUM 8.3* 8.0*  MG 1.9 1.7   Liver Function Tests No results for input(s): AST, ALT, ALKPHOS, BILITOT, PROT, ALBUMIN in the last 72 hours. No results for input(s): LIPASE, AMYLASE in the last 72 hours. Cardiac Enzymes No results for input(s): CKTOTAL, CKMB, CKMBINDEX, TROPONINI in the last 72 hours.  BNP: BNP (last 3 results) Recent Labs    01/05/2020 1619  BNP 577.5*    ProBNP (last 3 results) No results for input(s): PROBNP in the last 8760 hours.   D-Dimer No results for input(s): DDIMER in the last 72 hours. Hemoglobin A1C No results for input(s): HGBA1C in the last 72 hours. Fasting Lipid Panel No results for input(s): CHOL, HDL, LDLCALC, TRIG, CHOLHDL, LDLDIRECT in the last 72 hours. Thyroid Function Tests No results for input(s): TSH, T4TOTAL, T3FREE, THYROIDAB in the last 72 hours.  Invalid input(s): FREET3  Other results:   Imaging    No results found.   Medications:     Scheduled Medications: . sodium chloride   Intravenous Once  . apixaban  5 mg Oral BID  . atorvastatin  80 mg Oral Daily  . busPIRone  10 mg Oral TID  . carvedilol  6.25 mg Oral BID WC  .  Chlorhexidine Gluconate Cloth  6 each Topical Daily  . dicyclomine  10 mg Oral TID AC  . divalproex  500 mg Oral QHS  . FLUoxetine  40 mg Oral BID  . insulin aspart  0-15 Units Subcutaneous TID WC  . insulin aspart  0-5 Units Subcutaneous QHS  . sodium chloride flush  10-40 mL Intracatheter Q12H  . sodium chloride flush  3 mL Intravenous Q12H    Infusions: . sodium chloride    . amiodarone 30 mg/hr (01/01/20 1045)  . furosemide (LASIX) 200 mg in dextrose 5% 100 mL (2mg /mL) infusion 15 mg/hr (01/01/20 1250)  . milrinone 0.25 mcg/kg/min (01/01/20 1049)    PRN Medications: sodium chloride, acetaminophen, ondansetron (ZOFRAN) IV, sodium chloride flush, sodium chloride flush, traMADol      Assessment/Plan   1. Atrial fibrillation: H/o PAF.  He was on dofetilide in the remote past but this was stopped due to noncompliance. He had an upper GI bleed from antral ulcers in 3/12. He was seen by GI and was deemed safe to restart anticoagulation as long as he remains on a PPI.  No apparent recurrence of AF until this admit was cardioverted back to NSR in ER.  - Back in AF. Rate controlled. Increase amio drip to 60 mg per hour.   - Continue apixaban 5 mg bid. No bleeding - If doesn't convert will need DC-CV on Monday  2. CAD: Nonobstructive mild CAD on prior cath in 2002. Continue atorvastatin.  He has had chest pain but is pleuritic.  HS-TnI 64 => 92=>110 , suspect demand ischemia with volume overload .  - No s/s ischemia - Continue atorvastatin.  - Plan R/L cath when volume improved if creatinine ok   3. Acute on chronic systolic CHF: Nonischemic cardiomyopathy. Possible familial cardiomyopathy as both parents had cardiomyopathy and died at around 57.However, [employer] gene testing did not show any common mutation for cardiomyopathy.[employer] ICD. Last echo in 8/20 with EF 15% and mildly decreased RV function. Despite markedly low EF, he reports minimal symptoms up until yesterday when he said the peripheral edema and dyspnea/orthopnea began.  Suspect he has had progressive HF with eventual triggering of atrial fibrillation that made him feel acutely worse.   - Echo 12/21 EF < 20% RV normal.  - Co-ox low on admit - On milrinone 0.25. Co-ox 60% - Markedly volume overloaded. CVP 16 Continue IV diuresis. Increase lasix gtt to 20 - Stop carvedilol for now.  - Hold digoxin, dapagliflozin, Entresto, spironolactone with AKI.  - He would likely be an advanced therapies candidate. If renal function improves/ Blood type A+ - Plan R/L cath when volume improved if creatinine ok. Will need to hold Eliquis prior to cath.   4. OSA: OSA on sleep study, has not been using CPAP.   5. AKI on CKD  stage 3a: Last creatinine prior to admission was 1.38.   - Todays Creatinine down a little from 2.2>2.08 - continue hemodynamic support   6. Hyperkalemia - Received  Lokelma for K. - Todays K 3.7    Length of Stay: 2  Glori Bickers, MD  01/01/2020, 2:47 PM  Advanced Heart Failure Team Pager [phone] (M-F; 7a - 4p)  Please contact [facility] for night-coverage after hours (4p -7a ) and weekends on [email or web address]

## 2020-01-02 DIAGNOSIS — I5023 Acute on chronic systolic (congestive) heart failure: Secondary | ICD-10-CM | POA: Diagnosis not present

## 2020-01-02 DIAGNOSIS — I4891 Unspecified atrial fibrillation: Secondary | ICD-10-CM | POA: Diagnosis not present

## 2020-01-02 LAB — BASIC METABOLIC PANEL
Anion gap: 13 (ref 5–15)
BUN: 45 mg/dL — ABNORMAL HIGH (ref 6–20)
CO2: 25 mmol/L (ref 22–32)
Calcium: 7.5 mg/dL — ABNORMAL LOW (ref 8.9–10.3)
Chloride: 90 mmol/L — ABNORMAL LOW (ref 98–111)
Creatinine, Ser: 1.63 mg/dL — ABNORMAL HIGH (ref 0.61–1.24)
GFR, Estimated: 51 mL/min — ABNORMAL LOW (ref >60)
Glucose, Bld: 426 mg/dL — ABNORMAL HIGH (ref 70–99)
Potassium: 3.3 mmol/L — ABNORMAL LOW (ref 3.5–5.1)
Sodium: 128 mmol/L — ABNORMAL LOW (ref 135–145)

## 2020-01-02 LAB — COOXEMETRY PANEL
Carboxyhemoglobin: 1.1 % (ref 0.5–1.5)
Methemoglobin: 0.8 % (ref 0.0–1.5)
O2 Saturation: 61.4 %
Total hemoglobin: 13.9 g/dL (ref 12.0–16.0)

## 2020-01-02 LAB — GLUCOSE, CAPILLARY
Glucose-Capillary: 181 mg/dL — ABNORMAL HIGH (ref 70–99)
Glucose-Capillary: 234 mg/dL — ABNORMAL HIGH (ref 70–99)
Glucose-Capillary: 146 mg/dL — ABNORMAL HIGH (ref 70–99)
Glucose-Capillary: 203 mg/dL — ABNORMAL HIGH (ref 70–99)

## 2020-01-02 LAB — CBC
HCT: 42.4 % (ref 39.0–52.0)
Hemoglobin: 13.6 g/dL (ref 13.0–17.0)
MCH: 26.5 pg (ref 26.0–34.0)
MCHC: 32.1 g/dL (ref 30.0–36.0)
MCV: 82.5 fL (ref 80.0–100.0)
Platelets: 185 10*3/uL (ref 150–400)
RBC: 5.14 MIL/uL (ref 4.22–5.81)
RDW: 15.9 % — ABNORMAL HIGH (ref 11.5–15.5)
WBC: 7.5 10*3/uL (ref 4.0–10.5)
nRBC: 0 % (ref 0.0–0.2)

## 2020-01-02 LAB — MAGNESIUM: Magnesium: 1.5 mg/dL — ABNORMAL LOW (ref 1.7–2.4)

## 2020-01-02 MED ORDER — METOLAZONE 5 MG PO TABS
5.0000 mg | ORAL_TABLET | Freq: Once | ORAL | Status: AC
  Administered 2020-01-02: 5 mg via ORAL
  Filled 2020-01-02: qty 1

## 2020-01-02 MED ORDER — MAGNESIUM OXIDE 400 (241.3 MG) MG PO TABS
200.0000 mg | ORAL_TABLET | Freq: Every day | ORAL | Status: DC
  Administered 2020-01-02 – 2020-01-11 (×9): 200 mg via ORAL
  Filled 2020-01-02 (×9): qty 1

## 2020-01-02 MED ORDER — POTASSIUM CHLORIDE CRYS ER 20 MEQ PO TBCR
40.0000 meq | EXTENDED_RELEASE_TABLET | Freq: Two times a day (BID) | ORAL | Status: DC
  Administered 2020-01-02 – 2020-01-03 (×3): 40 meq via ORAL
  Filled 2020-01-02 (×3): qty 2

## 2020-01-02 MED ORDER — POTASSIUM CHLORIDE CRYS ER 20 MEQ PO TBCR
20.0000 meq | EXTENDED_RELEASE_TABLET | Freq: Once | ORAL | Status: AC
  Administered 2020-01-02: 20 meq via ORAL
  Filled 2020-01-02: qty 1

## 2020-01-02 MED ORDER — POTASSIUM CHLORIDE CRYS ER 20 MEQ PO TBCR
40.0000 meq | EXTENDED_RELEASE_TABLET | Freq: Once | ORAL | Status: AC
  Administered 2020-01-02: 40 meq via ORAL
  Filled 2020-01-02: qty 2

## 2020-01-02 NOTE — Progress Notes (Signed)
Called by RN re: low K+, Mg  Ordered additional Kdur 20 meq and started Mag ox 200 mg qd.  Rosaria Ferries, PA-C 01/02/2020 8:10 AM

## 2020-01-02 NOTE — Progress Notes (Signed)
Mg 1.5, K 3.3, this am, MD notified, will continue monitor, Thanks Arvella Nigh RN.

## 2020-01-02 NOTE — Progress Notes (Signed)
Advanced Heart Failure Rounding Note  PCP-Cardiologist: No primary care provider on file.   Subjective:    Started on milrinone and IV lasix 12/2 for low CO-OX and volume overload.    CVP was high yesterday with only modest urine output. Lasix gtt increased to 20/h. Weight down 5 pounds. CVP still 21.   Co-ox 61% on milrinone 0.25.   Denies CP, orthopnea or PND. Feels bloated. Creatinine 2.1 -> 1.6   Objective:   Weight Range: 117.6 kg Body mass index is 32.4 kg/m.   Vital Signs:   Temp:  [97.6 F (36.4 C)-98.6 F (37 C)] 97.9 F (36.6 C) (12/05 1121) Pulse Rate:  [94-100] 98 (12/05 1121) Resp:  [18-20] 18 (12/05 1121) BP: (113-122)/(85-98) 120/92 (12/05 1121) SpO2:  [89 %-92 %] 89 % (12/05 0731) Weight:  [117.6 kg] 117.6 kg (12/05 0000) Last BM Date: 01/02/20  Weight change: Filed Weights   12/31/19 0600 01/01/20 0300 01/02/20 0000  Weight: 119.4 kg 119.9 kg 117.6 kg    Intake/Output:   Intake/Output Summary (Last 24 hours) at 01/02/2020 1454 Last data filed at 01/02/2020 1300 Gross per 24 hour  Intake 2181.85 ml  Output 2725 ml  Net -543.15 ml      Physical Exam   General:  Sitting up in bed. No resp difficulty HEENT: normal Neck: supple. JVP to jaw. Carotids 2+ bilat; no bruits. No lymphadenopathy or thryomegaly appreciated. Cor: PMI nondisplaced. Irregular rate & rhythm. No rubs, gallops or murmurs. Lungs: clear Abdomen: obese  soft, nontender, nondistended. No hepatosplenomegaly. No bruits or masses. Good bowel sounds. Extremities: no cyanosis, clubbing, rash, 3+ edema Neuro: alert & orientedx3, cranial nerves grossly intact. moves all 4 extremities w/o difficulty. Affect pleasant   Telemetry   AF 90-110s Personally reviewed   Labs    CBC Recent Labs    01/01/20 0558 01/02/20 0512  WBC 5.8 7.5  HGB 12.0* 13.6  HCT 37.1* 42.4  MCV 83.0 82.5  PLT 143* 811   Basic Metabolic Panel Recent Labs    01/01/20 0558 01/02/20 0512  NA  131* 128*  K 3.7 3.3*  CL 95* 90*  CO2 24 25  GLUCOSE 170* 426*  BUN 52* 45*  CREATININE 2.08* 1.63*  CALCIUM 8.0* 7.5*  MG 1.7 1.5*   Liver Function Tests No results for input(s): AST, ALT, ALKPHOS, BILITOT, PROT, ALBUMIN in the last 72 hours. No results for input(s): LIPASE, AMYLASE in the last 72 hours. Cardiac Enzymes No results for input(s): CKTOTAL, CKMB, CKMBINDEX, TROPONINI in the last 72 hours.  BNP: BNP (last 3 results) Recent Labs    01/25/2020 1619  BNP 577.5*    ProBNP (last 3 results) No results for input(s): PROBNP in the last 8760 hours.   D-Dimer No results for input(s): DDIMER in the last 72 hours. Hemoglobin A1C No results for input(s): HGBA1C in the last 72 hours. Fasting Lipid Panel No results for input(s): CHOL, HDL, LDLCALC, TRIG, CHOLHDL, LDLDIRECT in the last 72 hours. Thyroid Function Tests No results for input(s): TSH, T4TOTAL, T3FREE, THYROIDAB in the last 72 hours.  Invalid input(s): FREET3  Other results:   Imaging    No results found.   Medications:     Scheduled Medications: . sodium chloride   Intravenous Once  . apixaban  5 mg Oral BID  . atorvastatin  80 mg Oral Daily  . busPIRone  10 mg Oral TID  . Chlorhexidine Gluconate Cloth  6 each Topical Daily  . dicyclomine  10 mg Oral TID AC  . divalproex  500 mg Oral QHS  . FLUoxetine  40 mg Oral BID  . insulin aspart  0-15 Units Subcutaneous TID WC  . insulin aspart  0-5 Units Subcutaneous QHS  . magnesium oxide  200 mg Oral Daily  . potassium chloride  20 mEq Oral BID  . sodium chloride flush  10-40 mL Intracatheter Q12H  . sodium chloride flush  3 mL Intravenous Q12H    Infusions: . sodium chloride    . amiodarone 60 mg/hr (01/02/20 0831)  . furosemide (LASIX) 200 mg in dextrose 5% 100 mL (2mg /mL) infusion 20 mg/hr (01/02/20 1202)  . milrinone 0.25 mcg/kg/min (01/02/20 0959)    PRN Medications: sodium chloride, acetaminophen, ondansetron (ZOFRAN) IV, sodium  chloride flush, sodium chloride flush, traMADol     Assessment/Plan   1. Atrial fibrillation: H/o PAF.  He was on dofetilide in the remote past but this was stopped due to noncompliance. He had an upper GI bleed from antral ulcers in 3/12. He was seen by GI and was deemed safe to restart anticoagulation as long as he remains on a PPI.  No apparent recurrence of AF until this admit was cardioverted back to NSR in ER.  - Back in AF. Rate controlled. Continue amio gtt at 60 - Continue apixaban 5 mg bid. No bleeding - Will need repet DC-CV oncevolume status improved.  2. CAD: Nonobstructive mild CAD on prior cath in 2002. Continue atorvastatin.  He has had chest pain but is pleuritic.  HS-TnI 64 => 92=>110 , suspect demand ischemia with volume overload .  - No s/s ischemia - Continue atorvastatin.  - Plan R/L cath when volume improved if creatinine ok   3. Acute on chronic systolic CHF: Nonischemic cardiomyopathy. Possible familial cardiomyopathy as both parents had cardiomyopathy and died at around 39.However, [employer] gene testing did not show any common mutation for cardiomyopathy.St Jude ICD. Last echo in 8/20 with EF 15% and mildly decreased RV function. Despite markedly low EF, he reports minimal symptoms up until yesterday when he said the peripheral edema and dyspnea/orthopnea began.  Suspect he has had progressive HF with eventual triggering of atrial fibrillation that made him feel acutely worse.   - Echo 12/21 EF < 20% RV normal.  - Co-ox low on admit - On milrinone 0.25. Co-ox 61% - Markedly volume overloaded. CVP 21 Continue IV diuresis with lasix at 20/hr. Add metoalzone. Can increase lasix gtt as needed.  - Off carvedilol for now.  - Hold digoxin, dapagliflozin, Entresto, spironolactone with AKI. May be able to restart soon (after cath) - He would likely be an advanced therapies candidate. If renal function improves/ Blood type A+ - Plan R/L cath when volume improved if  creatinine ok. Will need to hold Eliquis prior to cath.   4. OSA: OSA on sleep study, has not been using CPAP.   5. AKI on CKD stage 3a: Last creatinine prior to admission was 1.38.   - Todays Creatinine down 2.2>2.08 > 1.6 - continue hemodynamic support   6. Hyperkalemia - Received  Lokelma for K. - Todays K 3.3 -> will supp and add spiro  7. Hyponatremia - limit FW. May need tolvaptan. - daily BMET    Length of Stay: 3  Glori Bickers, MD  01/02/2020, 2:54 PM  Advanced Heart Failure Team Pager [phone] (M-F; 7a - 4p)  Please contact [facility] for night-coverage after hours (4p -7a ) and weekends on [email or web address]

## 2020-01-03 DIAGNOSIS — I5023 Acute on chronic systolic (congestive) heart failure: Secondary | ICD-10-CM | POA: Diagnosis not present

## 2020-01-03 DIAGNOSIS — I4891 Unspecified atrial fibrillation: Secondary | ICD-10-CM | POA: Diagnosis not present

## 2020-01-03 LAB — CBC
HCT: 41.9 % (ref 39.0–52.0)
Hemoglobin: 13.8 g/dL (ref 13.0–17.0)
MCH: 27 pg (ref 26.0–34.0)
MCHC: 32.9 g/dL (ref 30.0–36.0)
MCV: 81.8 fL (ref 80.0–100.0)
Platelets: 207 10*3/uL (ref 150–400)
RBC: 5.12 MIL/uL (ref 4.22–5.81)
RDW: 16.1 % — ABNORMAL HIGH (ref 11.5–15.5)
WBC: 6.8 10*3/uL (ref 4.0–10.5)
nRBC: 0 % (ref 0.0–0.2)

## 2020-01-03 LAB — MAGNESIUM: Magnesium: 1.5 mg/dL — ABNORMAL LOW (ref 1.7–2.4)

## 2020-01-03 LAB — COOXEMETRY PANEL
Carboxyhemoglobin: 1.1 % (ref 0.5–1.5)
Methemoglobin: 0.6 % (ref 0.0–1.5)
O2 Saturation: 60.5 %
Total hemoglobin: 13.3 g/dL (ref 12.0–16.0)

## 2020-01-03 LAB — GLUCOSE, CAPILLARY
Glucose-Capillary: 149 mg/dL — ABNORMAL HIGH (ref 70–99)
Glucose-Capillary: 146 mg/dL — ABNORMAL HIGH (ref 70–99)
Glucose-Capillary: 195 mg/dL — ABNORMAL HIGH (ref 70–99)
Glucose-Capillary: 203 mg/dL — ABNORMAL HIGH (ref 70–99)

## 2020-01-03 LAB — HEMOGLOBIN A1C
Hgb A1c MFr Bld: 10.6 % — ABNORMAL HIGH (ref 4.8–5.6)
Mean Plasma Glucose: 257.52 mg/dL

## 2020-01-03 LAB — BASIC METABOLIC PANEL
Anion gap: 15 (ref 5–15)
BUN: 42 mg/dL — ABNORMAL HIGH (ref 6–20)
CO2: 27 mmol/L (ref 22–32)
Calcium: 8.1 mg/dL — ABNORMAL LOW (ref 8.9–10.3)
Chloride: 89 mmol/L — ABNORMAL LOW (ref 98–111)
Creatinine, Ser: 1.75 mg/dL — ABNORMAL HIGH (ref 0.61–1.24)
GFR, Estimated: 47 mL/min — ABNORMAL LOW (ref >60)
Glucose, Bld: 395 mg/dL — ABNORMAL HIGH (ref 70–99)
Potassium: 3.2 mmol/L — ABNORMAL LOW (ref 3.5–5.1)
Sodium: 131 mmol/L — ABNORMAL LOW (ref 135–145)

## 2020-01-03 MED ORDER — MAGNESIUM SULFATE 50 % IJ SOLN
3.0000 g | Freq: Once | INTRAVENOUS | Status: AC
  Administered 2020-01-03: 3 g via INTRAVENOUS
  Filled 2020-01-03: qty 6

## 2020-01-03 MED ORDER — DIGOXIN 125 MCG PO TABS
0.1250 mg | ORAL_TABLET | Freq: Every day | ORAL | Status: DC
  Administered 2020-01-03: 0.125 mg via ORAL
  Filled 2020-01-03: qty 1

## 2020-01-03 MED ORDER — SPIRONOLACTONE 12.5 MG HALF TABLET: 12.5000 mg | ORAL_TABLET | Freq: Every day | ORAL | Status: DC

## 2020-01-03 MED ORDER — HEPARIN (PORCINE) 25000 UT/250ML-% IV SOLN
1300.0000 [IU]/h | INTRAVENOUS | Status: DC
  Administered 2020-01-03 – 2020-01-04 (×2): 1300 [IU]/h via INTRAVENOUS
  Filled 2020-01-03: qty 250
  Filled 2020-01-03: qty 500
  Filled 2020-01-03: qty 250

## 2020-01-03 MED ORDER — METOLAZONE 5 MG PO TABS
5.0000 mg | ORAL_TABLET | Freq: Once | ORAL | Status: AC
  Administered 2020-01-03: 5 mg via ORAL
  Filled 2020-01-03: qty 1

## 2020-01-03 MED ORDER — DAPAGLIFLOZIN PROPANEDIOL 10 MG PO TABS
10.0000 mg | ORAL_TABLET | Freq: Every day | ORAL | Status: DC
  Administered 2020-01-03 – 2020-01-11 (×8): 10 mg via ORAL
  Filled 2020-01-03 (×10): qty 1

## 2020-01-03 MED ORDER — POTASSIUM CHLORIDE CRYS ER 20 MEQ PO TBCR
40.0000 meq | EXTENDED_RELEASE_TABLET | Freq: Once | ORAL | Status: AC
  Administered 2020-01-03: 40 meq via ORAL
  Filled 2020-01-03: qty 2

## 2020-01-03 NOTE — Progress Notes (Signed)
Patient ID: Corey Palmer, male   DOB: July 09, 1970, 49 y.o.   MRN: [id number]     Advanced Heart Failure Rounding Note  PCP-Cardiologist: No primary care provider on file.   Subjective:    Started on milrinone and IV lasix 12/2 for low CO-OX and volume overload.  Today, CVP 16 with co-ox 61% on milrinone 0.25 and Lasix gtt 20 mg/hr with metolazone yesterday.  Good diuresis, net negative 4175.  Creatinine mildly higher at 1.75.   Somewhat lethargic this morning, did not get out of bed yesterday.   Objective:   Weight Range: 115.2 kg Body mass index is 31.75 kg/m.   Vital Signs:   Temp:  [97.9 F (36.6 C)-98.6 F (37 C)] 98.6 F (37 C) (12/06 0732) Pulse Rate:  [60-102] 89 (12/06 0501) Resp:  [18-20] 20 (12/06 0732) BP: (108-138)/(89-98) 129/94 (12/06 0501) SpO2:  [92 %-95 %] 95 % (12/06 0501) Weight:  [115.2 kg] 115.2 kg (12/06 0501) Last BM Date: 01/02/20  Weight change: Filed Weights   01/01/20 0300 01/02/20 0000 01/03/20 0501  Weight: 119.9 kg 117.6 kg 115.2 kg    Intake/Output:   Intake/Output Summary (Last 24 hours) at 01/03/2020 0906 Last data filed at 01/03/2020 0738 Gross per 24 hour  Intake 1552.92 ml  Output 6600 ml  Net -5047.08 ml      Physical Exam   General: NAD Neck: JVP 16 cm, no thyromegaly or thyroid nodule.  Lungs: Clear to auscultation bilaterally with normal respiratory effort. CV: Lateral PMI.  Heart regular S1/S2, no S3/S4, 2/6 HSM apex.  1+ edema to knees.  Abdomen: Soft, nontender, no hepatosplenomegaly, no distention.  Skin: Intact without lesions or rashes.  Neurologic: Alert and oriented x 3.  Psych: Normal affect. Extremities: No clubbing or cyanosis.  HEENT: Normal.    Telemetry   Atrial fibrillation 90s. Personally reviewed   Labs    CBC Recent Labs    01/02/20 0512 01/03/20 0513  WBC 7.5 6.8  HGB 13.6 13.8  HCT 42.4 41.9  MCV 82.5 81.8  PLT 185 175   Basic Metabolic Panel Recent Labs    01/02/20 0512  01/03/20 0513  NA 128* 131*  K 3.3* 3.2*  CL 90* 89*  CO2 25 27  GLUCOSE 426* 395*  BUN 45* 42*  CREATININE 1.63* 1.75*  CALCIUM 7.5* 8.1*  MG 1.5* 1.5*   Liver Function Tests No results for input(s): AST, ALT, ALKPHOS, BILITOT, PROT, ALBUMIN in the last 72 hours. No results for input(s): LIPASE, AMYLASE in the last 72 hours. Cardiac Enzymes No results for input(s): CKTOTAL, CKMB, CKMBINDEX, TROPONINI in the last 72 hours.  BNP: BNP (last 3 results) Recent Labs    01/10/2020 1619  BNP 577.5*    ProBNP (last 3 results) No results for input(s): PROBNP in the last 8760 hours.   D-Dimer No results for input(s): DDIMER in the last 72 hours. Hemoglobin A1C No results for input(s): HGBA1C in the last 72 hours. Fasting Lipid Panel No results for input(s): CHOL, HDL, LDLCALC, TRIG, CHOLHDL, LDLDIRECT in the last 72 hours. Thyroid Function Tests No results for input(s): TSH, T4TOTAL, T3FREE, THYROIDAB in the last 72 hours.  Invalid input(s): FREET3  Other results:   Imaging    No results found.   Medications:     Scheduled Medications: . sodium chloride   Intravenous Once  . atorvastatin  80 mg Oral Daily  . busPIRone  10 mg Oral TID  . Chlorhexidine Gluconate Cloth  6 each  Topical Daily  . dapagliflozin propanediol  10 mg Oral Daily  . dicyclomine  10 mg Oral TID AC  . divalproex  500 mg Oral QHS  . FLUoxetine  40 mg Oral BID  . insulin aspart  0-15 Units Subcutaneous TID WC  . insulin aspart  0-5 Units Subcutaneous QHS  . magnesium oxide  200 mg Oral Daily  . metolazone  5 mg Oral Once  . potassium chloride  40 mEq Oral BID  . potassium chloride  40 mEq Oral Once  . sodium chloride flush  10-40 mL Intracatheter Q12H  . sodium chloride flush  3 mL Intravenous Q12H    Infusions: . sodium chloride    . amiodarone 60 mg/hr (01/03/20 0603)  . furosemide (LASIX) 200 mg in dextrose 5% 100 mL (2mg /mL) infusion 20 mg/hr (01/02/20 2142)  . magnesium sulfate  bolus IVPB    . milrinone 0.25 mcg/kg/min (01/02/20 2142)    PRN Medications: sodium chloride, acetaminophen, ondansetron (ZOFRAN) IV, sodium chloride flush, sodium chloride flush, traMADol     Assessment/Plan   1. Atrial fibrillation: H/o PAF.  He was on dofetilide in the remote past but this was stopped due to noncompliance. He had an upper GI bleed from antral ulcers in 3/12. He was seen by GI and was deemed safe to restart anticoagulation as long as he remains on a PPI.  No apparent recurrence of AF until just prior to this admission, was cardioverted back to NSR in ER but has gone back to atrial fibrillation.  - Transition to heparin gtt for possible cath.   - Continue amiodarone gtt 60 mg/hr for now.  - Will plan LHC/RHC if creatinine stabilizes then repeat DCCV.  2. CAD: Nonobstructive mild CAD on prior cath. Continue atorvastatin.  He has had chest pain but is pleuritic.  HS-TnI 64 => 92, suspect demand ischemia with volume overload and afib/RVR.   No cath since 2002, minimal CAD at that point.  - Continue atorvastatin.  - Would like right/left heart cath with worsening CHF, will wait until creatinine stabilized and diuresed, will transition from Eliquis to heparin gtt.  3. Acute on chronic systolic CHF: Nonischemic cardiomyopathy. Possible familial cardiomyopathy as both parents had cardiomyopathy and died at around 89.However, [employer] gene testing did not show any common mutation for cardiomyopathy.[employer] Jude ICD. Echo in 8/20 with EF 15% and mildly decreased RV function. Echo this admission with EF < 20%, severe LV dilation, RV normal, severe LAE, no significant MR.Low output HF with markedly low EF.  He has a long history of cardiomyopathy (20 yrs), tends to minimize symptoms.  Cardiorenal syndrome with creatinine up to 2.2, now stabilized on milrinone.  Co-ox 61% on milrinone 0.25, CVP down to 16 on Lasix gtt 20 mg/hr + metolazone yesterday.  Good diuresis.  - Hold Entresto and  spironolactone with creatinine still high but better at 1.75.  - Can restart digoxin and dapagliflozin.  - Continue milrinone 0.25  - Continue Lasix gtt 20 mg/hr and will give another dose of metolazone.  - Needs repeat echo today.  - Coreg held with low output.   - Unna boots.  - I am concerned about his trajectory long-term. He may be nearing the need for advanced therapies.  Think he would be a potential LVAD or transplant candidate if creatinine settles out.  BMI is about 33 so not markedly high.  Will check blood type. When renal function is stabilized, will need right/left heart cath (no cath since 2002).  4. OSA: OSA on sleep study, has not been using CPAP.  - Start CPAP here.  5. AKI on CKD stage ?3: Last creatinine prior to admission was 1.38.  Creatinine peaked 2.2 this admission.  Suspect cardiorenal syndrome.  6. Diabetes: Check hgbA1c.  - Insulin sliding scale.  7. Hyponatremia: Hypervolemic hyponatremia: Fluid restrict.    Length of Stay: 4  Loralie Champagne, MD  01/03/2020, 9:06 AM  Advanced Heart Failure Team Pager [phone] (M-F; 7a - 4p)  Please contact [facility] for night-coverage after hours (4p -7a ) and weekends on [email or web address]

## 2020-01-03 NOTE — Progress Notes (Signed)
Unsuccessful PIV attempt x 1 by primary RN and 1 unsuccessful PIV attempt by Junie Panning RN. IV Team consulted for a PIV in L arm.

## 2020-01-03 NOTE — Progress Notes (Signed)
CARDIAC REHAB PHASE I   PRE:  Rate/Rhythm: 107-115 afib  BP:  Supine:   Sitting: 131/105  Standing:    SaO2: 96%RA  MODE:  Ambulation: 470 ft   POST:  Rate/Rhythm: 116 -122 afib  BP:  Supine:   Sitting: 142/112,131/104  Standing:    SaO2: 99%RA 1345-1432 Pt receptive to walking. Pt walked 470 ft on RA with steady gait as I managed equipment. BP elevated before and after walk. Pt stated his breathing much improved and more orthopedic issues affecting walk. To recliner after walk. Tolerated well.    Graylon Good, RN BSN  01/03/2020 2:28 PM

## 2020-01-03 NOTE — Plan of Care (Signed)
  Problem: Clinical Measurements: Goal: Ability to maintain clinical measurements within normal limits will improve Outcome: Progressing   Problem: Activity: Goal: Risk for activity intolerance will decrease Outcome: Progressing   

## 2020-01-03 NOTE — Progress Notes (Signed)
ANTICOAGULATION CONSULT NOTE - Initial Consult  Pharmacy Consult for heparin  Indication: atrial fibrillation  Allergies  Allergen Reactions  . Orange Fruit Anaphylaxis, Hives and Other (See Comments)    Per Pt- Blisters around lips and Hives all over, also  . Penicillins Anaphylaxis    Did it involve swelling of the face/tongue/throat, SOB, or low BP? Yes Did it involve sudden or severe rash/hives, skin peeling, or any reaction on the inside of your mouth or nose? Yes Did you need to seek medical attention at a hospital or doctor's office? No When did it last happen?childhood If all above answers are "NO", may proceed with cephalosporin use.  [name] [Empagliflozin] Itching  . Basaglar Kwikpen [Insulin Glargine] Nausea And Vomiting    Patient Measurements: Height: 6\' 3"  (190.5 cm) Weight: 115.2 kg (254 lb) IBW/kg (Calculated) : 84.5 Heparin Dosing Weight: 110kg   Vital Signs: Temp: 98.6 F (37 C) (12/06 0732) Temp Source: Oral (12/06 0501) BP: 129/94 (12/06 0501) Pulse Rate: 89 (12/06 0501)  Labs: Recent Labs    01/01/20 0558 01/01/20 0558 01/02/20 0512 01/03/20 0513  HGB 12.0*   < > 13.6 13.8  HCT 37.1*  --  42.4 41.9  PLT 143*  --  185 207  CREATININE 2.08*  --  1.63* 1.75*   < > = values in this interval not displayed.    Estimated Creatinine Clearance: 69.1 mL/min (A) (by C-G formula based on SCr of 1.75 mg/dL (H)).   Medical History: Past Medical History:  Diagnosis Date  . Anginal pain ([place])   . Anxiety   . Arthritis   . Automatic implantable cardioverter-defibrillator in situ 2007   [employer]tronic   . Bipolar disorder ([place])   . CAD (coronary artery disease)    Cardiac cath (08/19/2000) - Nonobstructive, 40% stenosis of LAD,.   . Cancer [facility]) 2013   benign stomach cancer  . CHF (congestive heart failure) ([place])   . Chondrocalcinosis of right knee 06/11/2013  . Chronic systolic heart failure ([place])    2D echo (69/6789) - Systolic function  severely reduced., LV EF 20-25%. Akinesis of apical and anteroseptal mycoardium.  last 2D echo (11/2008 ), LV EF 25-30%, diffuse hypokinesis, and grade 1 diastolic dysfunction.  . Depression   . Diabetes uncomplicated adult-type II    on insulin therapy, a1c 12.9 in 01/2011  . Dilated cardiomyopathy ([place])    status post ICD placement in 2008 c/b tearing at the atrial junction resulting in tamponade and urgent thoracotomy  . Dyslipidemia   . Dysrhythmia   . GERD (gastroesophageal reflux disease)   . Gout, unspecified   . Hepatic steatosis 2011   seen on ultrasound.   . History of kidney stones   . Hx of echocardiogram    Echo (04/2013):  Mild LVH, EF 20-25%, mild LAE, mild to mod RVE with mild reduced RVSF.  Marland Kitchen Hypertension   . Obesity (BMI 30.0-34.9)   . Pacemaker   . Paroxysmal atrial fibrillation (HCC)    on chronic coumadin, goal INR 2-3. status post direct current  . Peptic ulcer   . Shortness of breath   . Sleep apnea    does not have CPAP      Assessment: 48yom with atrial fibrillation on apixaban prior to admission.  Admit for HF exacerbation and plan for RHC when renal function improves.   Will hold apixaban and treat with heparin for cath.   Last apixaban 12/6 at 8am, will use aptt to dose heparin as  apixaban interferes with the heparin levels.   Goal of Therapy:  aPTT 66-103 seconds seconds Monitor platelets by anticoagulation protocol: Yes   Plan:  Stop apixaban Begin heparin drip 12hr after last dose apixaban Begin at 8pm heparin drip rate 1300 uts/hr Daily APTT and CBC Monitor s/s bleeding   Bonnita Nasuti Pharm.D. CPP, BCPS Clinical Pharmacist [phone] 01/03/2020 3:15 PM

## 2020-01-03 NOTE — Plan of Care (Signed)
  Problem: Nutrition: Goal: Adequate nutrition will be maintained Outcome: Completed/Met   Problem: Elimination: Goal: Will not experience complications related to bowel motility Outcome: Completed/Met Goal: Will not experience complications related to urinary retention Outcome: Completed/Met   Problem: Pain Managment: Goal: General experience of comfort will improve Outcome: Completed/Met   Problem: Safety: Goal: Ability to remain free from injury will improve Outcome: Completed/Met

## 2020-01-04 DIAGNOSIS — I5043 Acute on chronic combined systolic (congestive) and diastolic (congestive) heart failure: Secondary | ICD-10-CM | POA: Diagnosis not present

## 2020-01-04 DIAGNOSIS — I4891 Unspecified atrial fibrillation: Secondary | ICD-10-CM | POA: Diagnosis not present

## 2020-01-04 LAB — CBC
HCT: 28.1 % — ABNORMAL LOW (ref 39.0–52.0)
HCT: 39.1 % (ref 39.0–52.0)
Hemoglobin: 9.5 g/dL — ABNORMAL LOW (ref 13.0–17.0)
Hemoglobin: 13.2 g/dL (ref 13.0–17.0)
MCH: 27.8 pg (ref 26.0–34.0)
MCH: 27.2 pg (ref 26.0–34.0)
MCHC: 33.8 g/dL (ref 30.0–36.0)
MCHC: 33.8 g/dL (ref 30.0–36.0)
MCV: 82.2 fL (ref 80.0–100.0)
MCV: 80.6 fL (ref 80.0–100.0)
Platelets: 138 10*3/uL — ABNORMAL LOW (ref 150–400)
Platelets: 221 10*3/uL (ref 150–400)
RBC: 3.42 MIL/uL — ABNORMAL LOW (ref 4.22–5.81)
RBC: 4.85 MIL/uL (ref 4.22–5.81)
RDW: 15.8 % — ABNORMAL HIGH (ref 11.5–15.5)
RDW: 15.7 % — ABNORMAL HIGH (ref 11.5–15.5)
WBC: 5.4 10*3/uL (ref 4.0–10.5)
WBC: 7.4 10*3/uL (ref 4.0–10.5)
nRBC: 0 % (ref 0.0–0.2)
nRBC: 0 % (ref 0.0–0.2)

## 2020-01-04 LAB — BASIC METABOLIC PANEL
Anion gap: 17 — ABNORMAL HIGH (ref 5–15)
Anion gap: 12 (ref 5–15)
BUN: 37 mg/dL — ABNORMAL HIGH (ref 6–20)
BUN: 39 mg/dL — ABNORMAL HIGH (ref 6–20)
CO2: 32 mmol/L (ref 22–32)
CO2: 35 mmol/L — ABNORMAL HIGH (ref 22–32)
Calcium: 8.6 mg/dL — ABNORMAL LOW (ref 8.9–10.3)
Calcium: 9 mg/dL (ref 8.9–10.3)
Chloride: 81 mmol/L — ABNORMAL LOW (ref 98–111)
Chloride: 83 mmol/L — ABNORMAL LOW (ref 98–111)
Creatinine, Ser: 1.76 mg/dL — ABNORMAL HIGH (ref 0.61–1.24)
Creatinine, Ser: 1.73 mg/dL — ABNORMAL HIGH (ref 0.61–1.24)
GFR, Estimated: 47 mL/min — ABNORMAL LOW (ref >60)
GFR, Estimated: 47 mL/min — ABNORMAL LOW (ref >60)
Glucose, Bld: 417 mg/dL — ABNORMAL HIGH (ref 70–99)
Glucose, Bld: 299 mg/dL — ABNORMAL HIGH (ref 70–99)
Potassium: 2.8 mmol/L — ABNORMAL LOW (ref 3.5–5.1)
Potassium: 3.8 mmol/L (ref 3.5–5.1)
Sodium: 130 mmol/L — ABNORMAL LOW (ref 135–145)
Sodium: 130 mmol/L — ABNORMAL LOW (ref 135–145)

## 2020-01-04 LAB — GLUCOSE, CAPILLARY
Glucose-Capillary: 182 mg/dL — ABNORMAL HIGH (ref 70–99)
Glucose-Capillary: 180 mg/dL — ABNORMAL HIGH (ref 70–99)
Glucose-Capillary: 225 mg/dL — ABNORMAL HIGH (ref 70–99)
Glucose-Capillary: 307 mg/dL — ABNORMAL HIGH (ref 70–99)
Glucose-Capillary: 294 mg/dL — ABNORMAL HIGH (ref 70–99)

## 2020-01-04 LAB — COOXEMETRY PANEL
Carboxyhemoglobin: 1.4 % (ref 0.5–1.5)
Methemoglobin: 0.8 % (ref 0.0–1.5)
O2 Saturation: 65.8 %
Total hemoglobin: 14.5 g/dL (ref 12.0–16.0)

## 2020-01-04 LAB — APTT: aPTT: 75 seconds — ABNORMAL HIGH (ref 24–36)

## 2020-01-04 LAB — MAGNESIUM: Magnesium: 1.7 mg/dL (ref 1.7–2.4)

## 2020-01-04 MED ORDER — INSULIN GLARGINE 100 UNIT/ML ~~LOC~~ SOLN
10.0000 [IU] | Freq: Every day | SUBCUTANEOUS | Status: DC
  Filled 2020-01-04: qty 0.1

## 2020-01-04 MED ORDER — POTASSIUM CHLORIDE 10 MEQ/100ML IV SOLN
10.0000 meq | INTRAVENOUS | Status: AC
  Administered 2020-01-04 (×4): 10 meq via INTRAVENOUS
  Filled 2020-01-04 (×4): qty 100

## 2020-01-04 MED ORDER — SPIRONOLACTONE 12.5 MG HALF TABLET
12.5000 mg | ORAL_TABLET | Freq: Every day | ORAL | Status: DC
  Administered 2020-01-04: 12.5 mg via ORAL
  Filled 2020-01-04 (×2): qty 1

## 2020-01-04 MED ORDER — SODIUM CHLORIDE 0.9 % IV SOLN: INTRAVENOUS | Status: DC

## 2020-01-04 MED ORDER — SODIUM CHLORIDE 0.9 % IV SOLN: 250.0000 mL | INTRAVENOUS | Status: DC | PRN

## 2020-01-04 MED ORDER — MILRINONE LACTATE IN DEXTROSE 20-5 MG/100ML-% IV SOLN
0.1250 ug/kg/min | INTRAVENOUS | Status: DC
  Administered 2020-01-04: 0.125 ug/kg/min via INTRAVENOUS
  Filled 2020-01-04: qty 100

## 2020-01-04 MED ORDER — SODIUM CHLORIDE 0.9% FLUSH: 3.0000 mL | INTRAVENOUS | Status: DC | PRN

## 2020-01-04 MED ORDER — COLCHICINE 0.6 MG PO TABS
0.6000 mg | ORAL_TABLET | Freq: Every day | ORAL | Status: DC
  Administered 2020-01-04 – 2020-01-11 (×7): 0.6 mg via ORAL
  Filled 2020-01-04 (×7): qty 1

## 2020-01-04 MED ORDER — INSULIN GLARGINE 100 UNIT/ML ~~LOC~~ SOLN
40.0000 [IU] | Freq: Every day | SUBCUTANEOUS | Status: DC
  Filled 2020-01-04: qty 0.4

## 2020-01-04 MED ORDER — PREDNISONE 20 MG PO TABS
40.0000 mg | ORAL_TABLET | Freq: Every day | ORAL | Status: AC
  Administered 2020-01-04 – 2020-01-07 (×3): 40 mg via ORAL
  Filled 2020-01-04 (×4): qty 2

## 2020-01-04 MED ORDER — SODIUM CHLORIDE 0.9% FLUSH
3.0000 mL | Freq: Two times a day (BID) | INTRAVENOUS | Status: DC
  Administered 2020-01-04 – 2020-01-11 (×11): 3 mL via INTRAVENOUS

## 2020-01-04 MED ORDER — DIGOXIN 125 MCG PO TABS
0.1250 mg | ORAL_TABLET | Freq: Every day | ORAL | Status: DC
  Administered 2020-01-04 – 2020-01-11 (×7): 0.125 mg via ORAL
  Filled 2020-01-04 (×7): qty 1

## 2020-01-04 MED ORDER — ASPIRIN 81 MG PO CHEW
81.0000 mg | CHEWABLE_TABLET | ORAL | Status: AC
  Administered 2020-01-05: 81 mg via ORAL
  Filled 2020-01-04: qty 1

## 2020-01-04 MED ORDER — MAGNESIUM SULFATE 4 GM/100ML IV SOLN
4.0000 g | Freq: Once | INTRAVENOUS | Status: AC
  Administered 2020-01-04: 4 g via INTRAVENOUS
  Filled 2020-01-04: qty 100

## 2020-01-04 MED ORDER — INSULIN GLARGINE 100 UNIT/ML ~~LOC~~ SOLN
10.0000 [IU] | Freq: Every day | SUBCUTANEOUS | Status: DC
  Administered 2020-01-05 – 2020-01-11 (×7): 10 [IU] via SUBCUTANEOUS
  Filled 2020-01-04 (×8): qty 0.1

## 2020-01-04 MED ORDER — POTASSIUM CHLORIDE CRYS ER 20 MEQ PO TBCR
40.0000 meq | EXTENDED_RELEASE_TABLET | ORAL | Status: AC
  Administered 2020-01-04 (×4): 40 meq via ORAL
  Filled 2020-01-04 (×4): qty 2

## 2020-01-04 NOTE — Progress Notes (Signed)
ANTICOAGULATION CONSULT NOTE - Follow Up Consult  Pharmacy Consult for heparin  Indication: atrial fibrillation  Allergies  Allergen Reactions  . Orange Fruit Anaphylaxis, Hives and Other (See Comments)    Per Pt- Blisters around lips and Hives all over, also  . Penicillins Anaphylaxis    Did it involve swelling of the face/tongue/throat, SOB, or low BP? Yes Did it involve sudden or severe rash/hives, skin peeling, or any reaction on the inside of your mouth or nose? Yes Did you need to seek medical attention at a hospital or doctor's office? No When did it last happen?childhood If all above answers are "NO", may proceed with cephalosporin use.  [name] [Empagliflozin] Itching  . Basaglar Kwikpen [Insulin Glargine] Nausea And Vomiting    Patient Measurements: Height: 6\' 3"  (190.5 cm) Weight: 115.2 kg (254 lb) IBW/kg (Calculated) : 84.5 Heparin Dosing Weight: 110kg   Vital Signs: Temp: 97.5 F (36.4 C) (12/07 1205) Temp Source: Oral (12/07 1205) BP: 111/82 (12/07 1205) Pulse Rate: 110 (12/07 1205)  Labs: Recent Labs    01/02/20 0512 01/02/20 0512 01/03/20 0513 01/03/20 0513 01/04/20 0337 01/04/20 0440 01/04/20 1017  HGB 13.6   < > 13.8   < >  --  9.5* 13.2  HCT 42.4   < > 41.9  --   --  28.1* 39.1  PLT 185   < > 207  --   --  138* 221  APTT  --   --   --   --   --  75*  --   CREATININE 1.63*  --  1.75*  --  1.76*  --   --    < > = values in this interval not displayed.    Estimated Creatinine Clearance: 68.8 mL/min (A) (by C-G formula based on SCr of 1.76 mg/dL (H)).   Medical History: Past Medical History:  Diagnosis Date  . Anginal pain ([place])   . Anxiety   . Arthritis   . Automatic implantable cardioverter-defibrillator in situ 2007   [employer]tronic   . Bipolar disorder ([place])   . CAD (coronary artery disease)    Cardiac cath (08/19/2000) - Nonobstructive, 40% stenosis of LAD,.   . Cancer [facility]) 2013   benign stomach cancer  . CHF (congestive heart  failure) ([place])   . Chondrocalcinosis of right knee 06/11/2013  . Chronic systolic heart failure ([place])    2D echo (27/2536) - Systolic function severely reduced., LV EF 20-25%. Akinesis of apical and anteroseptal mycoardium.  last 2D echo (11/2008 ), LV EF 25-30%, diffuse hypokinesis, and grade 1 diastolic dysfunction.  . Depression   . Diabetes uncomplicated adult-type II    on insulin therapy, a1c 12.9 in 01/2011  . Dilated cardiomyopathy ([place])    status post ICD placement in 2008 c/b tearing at the atrial junction resulting in tamponade and urgent thoracotomy  . Dyslipidemia   . Dysrhythmia   . GERD (gastroesophageal reflux disease)   . Gout, unspecified   . Hepatic steatosis 2011   seen on ultrasound.   . History of kidney stones   . Hx of echocardiogram    Echo (04/2013):  Mild LVH, EF 20-25%, mild LAE, mild to mod RVE with mild reduced RVSF.  Marland Kitchen Hypertension   . Obesity (BMI 30.0-34.9)   . Pacemaker   . Paroxysmal atrial fibrillation (HCC)    on chronic coumadin, goal INR 2-3. status post direct current  . Peptic ulcer   . Shortness of breath   . Sleep  apnea    does not have CPAP      Assessment: 48yom with atrial fibrillation on apixaban prior to admission.  Admit for HF exacerbation and plan for RHC when renal function improves.   Will hold apixaban and treat with heparin for cath.   Last apixaban 12/6 at 8am, will use aptt to dose heparin as apixaban interferes with the heparin levels.   Heparin drip 1300 uts/hr aptt 75sec at goal  Recheck BC stable   Goal of Therapy:  aPTT 66-103 seconds seconds Monitor platelets by anticoagulation protocol: Yes   Plan:  Continue  heparin drip rate 1300 uts/hr Daily APTT/heparin level and CBC Monitor s/s bleeding   Bonnita Nasuti Pharm.D. CPP, BCPS Clinical Pharmacist [phone] 01/04/2020 12:10 PM

## 2020-01-04 NOTE — Progress Notes (Signed)
   01/04/20 0543  Vitals  Temp 97.8 F (36.6 C)  Temp Source Oral  BP 134/83  MAP (mmHg) 93  BP Location Left Arm  BP Method Automatic  Patient Position (if appropriate) Lying  Pulse Rate 94  ECG Heart Rate 93  MEWS COLOR  MEWS Score Color Green  Oxygen Therapy  SpO2 92 %  O2 Device Room Air  MEWS Score  MEWS Temp 0  MEWS Systolic 0  MEWS Pulse 0  MEWS RR 0  MEWS LOC 0  MEWS Score 0  Patient had 30 beat run of vtach. HR went up to 200. Patient states he felt his ICD go off. Confirmed with rhythm strip. Cardiology on call paged and notified. No new orders at this time. Patient resting in bed. No complaints at this time. Will continue to monitor.

## 2020-01-04 NOTE — Plan of Care (Signed)
?  Problem: Education: ?Goal: Ability to demonstrate management of disease process will improve ?Outcome: Progressing ?  ?Problem: Activity: ?Goal: Capacity to carry out activities will improve ?Outcome: Progressing ?  ?Problem: Cardiac: ?Goal: Ability to achieve and maintain adequate cardiopulmonary perfusion will improve ?Outcome: Progressing ?  ?

## 2020-01-04 NOTE — Progress Notes (Addendum)
Patient ID: Corey Palmer, male   DOB: [birth year], 49 y.o.   MRN: [id number]     Advanced Heart Failure Rounding Note  PCP-Cardiologist: No primary care provider on file.   Subjective:    Significant Overnight Event: Pt had ICD shock last night. 14 sec of torsades on tele. QTc 590 ms. In setting of severe hypokalemia w/ K 2.8 after brisk diuresis yesterday w/ -9.5L in UOP. Mg 1.7   Remains on milrinone 0.25 and lasix gtt at 20 mg/hr. On Amio gtt at 60 mg/hr.   Co-ox 66%. CVP 9-10.   Currently in Afib, rate controlled.   Marked difference in Hgb 13.8>>9.5. Doubt If accurate. Hgb on co-ox 14.5. STAT repeat CBC pending.   Complains of gout pain in rt knee. Past h/o gout.   Objective:   Weight Range: 115.2 kg Body mass index is 31.75 kg/m.   Vital Signs:   Temp:  [97.7 F (36.5 C)-98.7 F (37.1 C)] 97.8 F (36.6 C) (12/07 0543) Pulse Rate:  [83-104] 94 (12/07 0543) Resp:  [19-20] 20 (12/07 0356) BP: (110-134)/(75-108) 134/83 (12/07 0543) SpO2:  [92 %-95 %] 92 % (12/07 0543) Last BM Date: 01/03/20  Weight change: Filed Weights   01/01/20 0300 01/02/20 0000 01/03/20 0501  Weight: 119.9 kg 117.6 kg 115.2 kg    Intake/Output:   Intake/Output Summary (Last 24 hours) at 01/04/2020 0725 Last data filed at 01/04/2020 0700 Gross per 24 hour  Intake 1549.33 ml  Output 9450 ml  Net -7900.67 ml      Physical Exam   CVP 9-10  General:  Fatigue appearing, moderately obese. No respiratory difficulty HEENT: normal Neck: supple. JVD elevated to jaw. Carotids 2+ bilat; no bruits. No lymphadenopathy or thyromegaly appreciated. Cor: PMI nondisplaced. Irregularly irregular rhythm and rate. No rubs, gallops or murmurs. Lungs: clear Abdomen: soft, nontender, nondistended. No hepatosplenomegaly. No bruits or masses. Good bowel sounds. Extremities: no cyanosis, clubbing, rash, trace bilateral LEE,  Bilateral unna boots Neuro: alert & oriented x 3, cranial nerves grossly intact. moves  all 4 extremities w/o difficulty. Affect pleasant.    Telemetry   Atrial fibrillation 90s. Brief torsades ~14 sec Personally reviewed   Labs    CBC Recent Labs    01/03/20 0513 01/04/20 0440  WBC 6.8 5.4  HGB 13.8 9.5*  HCT 41.9 28.1*  MCV 81.8 82.2  PLT 207 275*   Basic Metabolic Panel Recent Labs    01/03/20 0513 01/04/20 0337  NA 131* 130*  K 3.2* 2.8*  CL 89* 81*  CO2 27 32  GLUCOSE 395* 417*  BUN 42* 37*  CREATININE 1.75* 1.76*  CALCIUM 8.1* 8.6*  MG 1.5* 1.7   Liver Function Tests No results for input(s): AST, ALT, ALKPHOS, BILITOT, PROT, ALBUMIN in the last 72 hours. No results for input(s): LIPASE, AMYLASE in the last 72 hours. Cardiac Enzymes No results for input(s): CKTOTAL, CKMB, CKMBINDEX, TROPONINI in the last 72 hours.  BNP: BNP (last 3 results) Recent Labs    01/05/2020 1619  BNP 577.5*    ProBNP (last 3 results) No results for input(s): PROBNP in the last 8760 hours.   D-Dimer No results for input(s): DDIMER in the last 72 hours. Hemoglobin A1C Recent Labs    01/03/20 1541  HGBA1C 10.6*   Fasting Lipid Panel No results for input(s): CHOL, HDL, LDLCALC, TRIG, CHOLHDL, LDLDIRECT in the last 72 hours. Thyroid Function Tests No results for input(s): TSH, T4TOTAL, T3FREE, THYROIDAB in the last 72 hours.  Invalid input(s): FREET3  Other results:   Imaging    No results found.   Medications:     Scheduled Medications: . sodium chloride   Intravenous Once  . atorvastatin  80 mg Oral Daily  . busPIRone  10 mg Oral TID  . Chlorhexidine Gluconate Cloth  6 each Topical Daily  . dapagliflozin propanediol  10 mg Oral Daily  . dicyclomine  10 mg Oral TID AC  . digoxin  0.125 mg Oral Daily  . divalproex  500 mg Oral QHS  . FLUoxetine  40 mg Oral BID  . insulin aspart  0-15 Units Subcutaneous TID WC  . insulin aspart  0-5 Units Subcutaneous QHS  . magnesium oxide  200 mg Oral Daily  . potassium chloride  40 mEq Oral BID  .  sodium chloride flush  10-40 mL Intracatheter Q12H  . sodium chloride flush  3 mL Intravenous Q12H    Infusions: . sodium chloride    . amiodarone 60 mg/hr (01/04/20 0114)  . furosemide (LASIX) 200 mg in dextrose 5% 100 mL (2mg /mL) infusion 20 mg/hr (01/04/20 0335)  . heparin 1,300 Units/hr (01/04/20 0253)  . milrinone 0.25 mcg/kg/min (01/04/20 0355)    PRN Medications: sodium chloride, acetaminophen, ondansetron (ZOFRAN) IV, sodium chloride flush, sodium chloride flush, traMADol     Assessment/Plan   1. Atrial fibrillation: H/o PAF.  He was on dofetilide in the remote past but this was stopped due to noncompliance. He had an upper GI bleed from antral ulcers in 3/12. He was seen by GI and was deemed safe to restart anticoagulation as long as he remains on a PPI.  No apparent recurrence of AF until just prior to this admission, was cardioverted back to NSR in ER but has gone back to atrial fibrillation.  - Continue heparin gtt for possible cath. Transition back to Eliquis after procedures.  - Continue amiodarone gtt 60 mg/hr for now.  - Will plan LHC/RHC if creatinine stabilizes then repeat DCCV.  - Agressively supp K and Mg  2. CAD: Nonobstructive mild CAD on prior cath. Continue atorvastatin.  He has had chest pain but is pleuritic.  HS-TnI 64 => 92, suspect demand ischemia with volume overload and afib/RVR.   No cath since 2002, minimal CAD at that point.  - Continue atorvastatin.  - Would like right/left heart cath with worsening CHF, will wait until creatinine stabilized and diuresed. Continue on  heparin gtt.  3. Acute on chronic systolic CHF: Nonischemic cardiomyopathy. Possible familial cardiomyopathy as both parents had cardiomyopathy and died at around 89.However, [employer] gene testing did not show any common mutation for cardiomyopathy.St Jude ICD. Echo in 8/20 with EF 15% and mildly decreased RV function. Echo this admission with EF < 20%, severe LV dilation, RV normal,  severe LAE, no significant MR.Low output HF with markedly low EF.  He has a long history of cardiomyopathy (20 yrs), tends to minimize symptoms.  Cardiorenal syndrome with creatinine up to 2.2, now stabilized on milrinone.  Co-ox 66% on milrinone 0.25, CVP down to 9-10 on Lasix gtt 20 mg/hr + metolazone yesterday.  Good diuresis. Now w/ severe hypokalemia/ hypomagnesemia and torsades w/ subsequent ICD shock.  - Stop lasix gtt for now until K and Mg repleated.  - Hold digoxin today w/ severe hypokalemia   - Hold Entresto and spironolactone with creatinine still high but better at 1.75.  - Continue dapagliflozin.  - Continue milrinone 0.25  - Coreg held with low output.   -  Continue Unna boots.  - I am concerned about his trajectory long-term. He may be nearing the need for advanced therapies.  Think he would be a potential LVAD or transplant candidate if creatinine settles out.  BMI is about 33 so not markedly high.  Will check blood type. When renal function is stabilized, will need right/left heart cath (no cath since 2002).  4. OSA: OSA on sleep study, has not been using CPAP.  - Start CPAP here.  5. AKI on CKD stage ?3: Last creatinine prior to admission was 1.38.  Creatinine peaked 2.2 this admission.  Suspect cardiorenal syndrome.  - stable today at 1.7. Holding diuretics as outlined above 6. Diabetes: hgbA1c 10.6.  - Insulin sliding scale.  - Consult diabetes coordinator.  7. Hyponatremia: Hypervolemic hyponatremia: Fluid restrict.  8. Torsades/ICD shock: in hospital event, in setting of severe hypokalemia and hypomagnesemia, K 2.8, Mg 1.7. QTc prolonged 590 ms - hold lasix - give IV K runs + IV Mg, repeat BMP and Mg labs at 1200. D/w pharmacy  - continue IV amio  9. Gout: h/o gout. Complains of rt knee pain c/w previous flares - treat empirically w/ prednisone  - Add colchicine 0.6 (takes with relief at home).    Length of Stay: [address] PA-C  01/04/2020, 7:25  AM  Advanced Heart Failure Team Pager [phone] (M-F; 7a - 4p)  Please contact [facility] for night-coverage after hours (4p -7a ) and weekends on [email or web address]  Patient seen with PA, agree with the above note.   Episode of polymorphic VT this morning in setting of profound diuresis and low K.  Terminated by ICD.  Weight down another 5 lbs, CVP 10 this morning with co-ox 66%.  Breathing better. Creatinine stable at 1.76.   Main complain is gout pain R knee.   General: NAD Neck: JVP 10-12 cm, no thyromegaly or thyroid nodule.  Lungs: Clear to auscultation bilaterally with normal respiratory effort. CV: Lateral PMI.  Heart regular S1/S2, no S3/S4, no murmur.  1+ ankle edema.  Abdomen: Soft, nontender, no hepatosplenomegaly, no distention.  Skin: Intact without lesions or rashes.  Neurologic: Alert and oriented x 3.  Psych: Normal affect. Extremities: No clubbing or cyanosis.  HEENT: Normal.    Continue milrinone 0.25, good co-ox. CVP much better but still mildly elevated.  Will hold IV Lasix this morning and replete K and Mg.  Recheck BMET in afternoon.  Will hold digoxin as well with low K, likely restart tomorrow.  Can start spironolactone 12.5 mg daily.  If K improved this afternoon, will likely need IV Lasix dose this evening.   Plan for RHC/LHC tomorrow if renal function remains stable.   He is still in atrial fibrillation on amiodarone gtt and heparin.  Will plan TEE-guided DCCV later in week after cath, preferably when weaning milrinone.   Treat gout with prednisone burst + colchicine.   HgbA1c high, will have diabetes coordinator see patient and will restart his home insulin glargine 40 qhs.   Loralie Champagne 01/04/2020 8:22 AM

## 2020-01-04 NOTE — Progress Notes (Signed)
Patient is alert and oriented x 4 and verbalizes that he has no questions in regards to his left and right heart cath. Informed consent has been placed in the patient's chart.

## 2020-01-04 NOTE — Progress Notes (Signed)
Orthopedic Tech Progress Note Patient Details:  RITHY MANDLEY 12-30-1970 [id number]  Ortho Devices Type of Ortho Device: Louretta Parma boot Ortho Device/Splint Location: BLE Ortho Device/Splint Interventions: Ordered, Application   Post Interventions Patient Tolerated: Well Instructions Provided: Care of device, Poper ambulation with device   Ivonne Freeburg 01/04/2020, 7:39 PM

## 2020-01-04 NOTE — Progress Notes (Signed)
Repeat BMET this afternoon better with K 3.8   CVP 9-10. Keep off lasix fornow.   In A fib with rate 110-130s. Continue amio 60 mg per hour.  CO-OX this am 65%. Cut back milrinone to 0.125 mcg.   Can restart digoxin 0.125 mg.   LHC/RHC tomorrow.   Dorothyann Mourer NP-C  3:18 PM

## 2020-01-04 NOTE — Progress Notes (Addendum)
Inpatient Diabetes Program Recommendations  AACE/ADA: New Consensus Statement on Inpatient Glycemic Control   Target Ranges:  Prepandial:   less than 140 mg/dL      Peak postprandial:   less than 180 mg/dL (1-2 hours)      Critically ill patients:  140 - 180 mg/dL   Results for Corey Palmer, Corey Palmer (MRN [id number]) as of 01/04/2020 08:59  Ref. Range 01/03/2020 06:18 01/03/2020 11:37 01/03/2020 16:27 01/03/2020 21:00 01/04/2020 06:08 01/04/2020 08:06  Glucose-Capillary Latest Ref Range: 70 - 99 mg/dL 149 (H) 146 (H) 195 (H) 203 (H) 182 (H) 180 (H)  Results for Corey Palmer, Corey Palmer (MRN [id number]) as of 01/04/2020 08:59  Ref. Range 02/03/2019 16:24 12/01/2019 16:04 01/03/2020 15:41  Hemoglobin A1C Latest Ref Range: 4.8 - 5.6 % 12.5 (A) 10.8 (A) 10.6 (H)   Review of Glycemic Control  Diabetes history: DM2 Outpatient Diabetes medications: Lantus 40 units QAM (due to insurance issue has not had Lantus since mid October 2021), Farxiga 10 mg daily, Novolog 5-50 units TID, Ozempic 0.5 mg Qweek (Sunday) Current orders for Inpatient glycemic control: Lantus 40 units daily, Novolog 0-15 units TID with meals, Novolog 0-5 units QHS, Farxiga 10 mg daily; Prednisone 40 mg QAM  Inpatient Diabetes Program Recommendations:    Insulin: Noted Lantus 40 units daily ordered today. Please consider decreasing Lantus to 10 units daily.  HbgA1C:  A1C 10.6% on 01/03/20 indicating an average glucose of 258 mg/dl over the past 2-3 months. At time of discharge, please provide Rx for Toujeo (insulin glargine).      NOTE: In reviewing chart, noted patient sees Dr. Loanne Drilling (Endocrinologist) and was last seen 12/01/19. Per office note on 12/01/19 by Dr. Loanne Drilling "ins declines farxiga; he declines continuous glucose monitor, due to cost; he works as heavy Office manager; he stopped Omnipod, due to cost; he takes QD insulin, after poor results with multiple daily injections; Trulicity caused bloating.  Pt says ins no longer covers Lantus, so he has  been out x 1 month.  He was changed to [place], but he stopped due to nausea.  It has since resolved.  Pt says cbg's are in the 200's. Sent prescription to pharmacy to increase the Ozempic. We are doing a prior auth for Lantus."  Will plan to follow up with patient today.  Addendum 01/04/20@12 :20: Spoke with patient about diabetes and home regimen for diabetes control. Patient reports being followed by Dr. Loanne Drilling for diabetes management.  Patient reports he is not able to get Lantus insulin due to insurance not covering. In reviewing chart, noted Basaglar and Levemir are preferred insulins.  Patient states that Nancee Liter makes him nauseous and he can not tolerate. Patient states that he has tried Levemir in the past but it caused inflammation at the injection site. Patient states that Dr. Loanne Drilling is suppose to be working to get authorization for the Lantus but he has not heard anything from Dr. Cordelia Pen office or his insurance since his appoinmtnet on 12/01/19.  Patient reports that his glucose is usually under 200 mg/dl.  Discussed A1C results (10.6% on 01/03/20) and explained that current A1C indicates an average glucose of 258 mg/dl over the past 2-3 months. Discussed glucose and A1C goals. Discussed importance of checking CBGs and maintaining good CBG control to prevent long-term and short-term complications. Encouraged patient to follow up with Dr. Loanne Drilling regarding DM control. Inquired about any prior use of Toujeo and patient states that he has never used Toujeo insulin but would be willing to try  if insurance covers.  Patient verbalized understanding of information discussed and reports no further questions at this time related to diabetes. Checked chart and it appears that Nelva Nay is covered under current insurance in chart. Would recommend discharging patient on Toujeo insulin and have patient follow up with Dr. Loanne Drilling.  Thanks, Barnie Alderman, RN, MSN, CDE Diabetes Coordinator Inpatient Diabetes  Program [phone] (Team Pager)   Thanks, Barnie Alderman, RN, MSN, CDE Diabetes Coordinator Inpatient Diabetes Program [phone] (Team Pager from 8am to 5pm)

## 2020-01-05 ENCOUNTER — Encounter (HOSPITAL_COMMUNITY): Admission: EM | Disposition: E | Payer: Self-pay | Source: Home / Self Care | Attending: Cardiothoracic Surgery

## 2020-01-05 DIAGNOSIS — I4891 Unspecified atrial fibrillation: Secondary | ICD-10-CM | POA: Diagnosis not present

## 2020-01-05 DIAGNOSIS — I509 Heart failure, unspecified: Secondary | ICD-10-CM

## 2020-01-05 DIAGNOSIS — I5023 Acute on chronic systolic (congestive) heart failure: Secondary | ICD-10-CM | POA: Diagnosis not present

## 2020-01-05 DIAGNOSIS — I2510 Atherosclerotic heart disease of native coronary artery without angina pectoris: Secondary | ICD-10-CM

## 2020-01-05 HISTORY — PX: RIGHT/LEFT HEART CATH AND CORONARY ANGIOGRAPHY: CATH118266

## 2020-01-05 LAB — CBC
HCT: 45.7 % (ref 39.0–52.0)
Hemoglobin: 14.6 g/dL (ref 13.0–17.0)
MCH: 26.2 pg (ref 26.0–34.0)
MCHC: 31.9 g/dL (ref 30.0–36.0)
MCV: 82 fL (ref 80.0–100.0)
Platelets: 246 10*3/uL (ref 150–400)
RBC: 5.57 MIL/uL (ref 4.22–5.81)
RDW: 15.6 % — ABNORMAL HIGH (ref 11.5–15.5)
WBC: 10.9 10*3/uL — ABNORMAL HIGH (ref 4.0–10.5)
nRBC: 0 % (ref 0.0–0.2)

## 2020-01-05 LAB — HEPARIN LEVEL (UNFRACTIONATED): Heparin Unfractionated: 0.53 IU/mL (ref 0.30–0.70)

## 2020-01-05 LAB — POCT I-STAT EG7
Acid-Base Excess: 13 mmol/L — ABNORMAL HIGH (ref 0.0–2.0)
Acid-Base Excess: 13 mmol/L — ABNORMAL HIGH (ref 0.0–2.0)
Bicarbonate: 40.1 mmol/L — ABNORMAL HIGH (ref 20.0–28.0)
Bicarbonate: 40.2 mmol/L — ABNORMAL HIGH (ref 20.0–28.0)
Calcium, Ion: 1.15 mmol/L (ref 1.15–1.40)
Calcium, Ion: 1.16 mmol/L (ref 1.15–1.40)
HCT: 51 % (ref 39.0–52.0)
HCT: 52 % (ref 39.0–52.0)
Hemoglobin: 17.3 g/dL — ABNORMAL HIGH (ref 13.0–17.0)
Hemoglobin: 17.7 g/dL — ABNORMAL HIGH (ref 13.0–17.0)
O2 Saturation: 44 %
O2 Saturation: 46 %
Potassium: 4.1 mmol/L (ref 3.5–5.1)
Potassium: 4.1 mmol/L (ref 3.5–5.1)
Sodium: 134 mmol/L — ABNORMAL LOW (ref 135–145)
Sodium: 134 mmol/L — ABNORMAL LOW (ref 135–145)
TCO2: 42 mmol/L — ABNORMAL HIGH (ref 22–32)
TCO2: 42 mmol/L — ABNORMAL HIGH (ref 22–32)
pCO2, Ven: 58 mmHg (ref 44.0–60.0)
pCO2, Ven: 58.8 mmHg (ref 44.0–60.0)
pH, Ven: 7.442 — ABNORMAL HIGH (ref 7.250–7.430)
pH, Ven: 7.448 — ABNORMAL HIGH (ref 7.250–7.430)
pO2, Ven: 25 mmHg — CL (ref 32.0–45.0)
pO2, Ven: 25 mmHg — CL (ref 32.0–45.0)

## 2020-01-05 LAB — BASIC METABOLIC PANEL
Anion gap: 17 — ABNORMAL HIGH (ref 5–15)
BUN: 44 mg/dL — ABNORMAL HIGH (ref 6–20)
CO2: 30 mmol/L (ref 22–32)
Calcium: 8.6 mg/dL — ABNORMAL LOW (ref 8.9–10.3)
Chloride: 81 mmol/L — ABNORMAL LOW (ref 98–111)
Creatinine, Ser: 1.83 mg/dL — ABNORMAL HIGH (ref 0.61–1.24)
GFR, Estimated: 44 mL/min — ABNORMAL LOW (ref >60)
Glucose, Bld: 426 mg/dL — ABNORMAL HIGH (ref 70–99)
Potassium: 4.5 mmol/L (ref 3.5–5.1)
Sodium: 128 mmol/L — ABNORMAL LOW (ref 135–145)

## 2020-01-05 LAB — APTT: aPTT: 72 seconds — ABNORMAL HIGH (ref 24–36)

## 2020-01-05 LAB — GLUCOSE, CAPILLARY
Glucose-Capillary: 138 mg/dL — ABNORMAL HIGH (ref 70–99)
Glucose-Capillary: 152 mg/dL — ABNORMAL HIGH (ref 70–99)
Glucose-Capillary: 173 mg/dL — ABNORMAL HIGH (ref 70–99)
Glucose-Capillary: 188 mg/dL — ABNORMAL HIGH (ref 70–99)
Glucose-Capillary: 210 mg/dL — ABNORMAL HIGH (ref 70–99)

## 2020-01-05 LAB — COOXEMETRY PANEL
Carboxyhemoglobin: 1.3 % (ref 0.5–1.5)
Methemoglobin: 0.6 % (ref 0.0–1.5)
O2 Saturation: 71.9 %
Total hemoglobin: 14.7 g/dL (ref 12.0–16.0)

## 2020-01-05 LAB — MAGNESIUM: Magnesium: 2.2 mg/dL (ref 1.7–2.4)

## 2020-01-05 SURGERY — RIGHT/LEFT HEART CATH AND CORONARY ANGIOGRAPHY
Anesthesia: LOCAL

## 2020-01-05 MED ORDER — HEPARIN (PORCINE) IN NACL 1000-0.9 UT/500ML-% IV SOLN
INTRAVENOUS | Status: AC
  Filled 2020-01-05: qty 1000

## 2020-01-05 MED ORDER — HEPARIN (PORCINE) IN NACL 1000-0.9 UT/500ML-% IV SOLN
INTRAVENOUS | Status: DC | PRN
  Administered 2020-01-05: 500 mL

## 2020-01-05 MED ORDER — HEPARIN SODIUM (PORCINE) 1000 UNIT/ML IJ SOLN
INTRAMUSCULAR | Status: DC | PRN
  Administered 2020-01-05: 5000 [IU] via INTRAVENOUS

## 2020-01-05 MED ORDER — HEPARIN (PORCINE) 25000 UT/250ML-% IV SOLN
1600.0000 [IU]/h | INTRAVENOUS | Status: DC
  Administered 2020-01-05: 23:00:00 1300 [IU]/h via INTRAVENOUS
  Administered 2020-01-06: 1500 [IU]/h via INTRAVENOUS
  Administered 2020-01-07 (×2): 1600 [IU]/h via INTRAVENOUS
  Filled 2020-01-05 (×4): qty 250

## 2020-01-05 MED ORDER — LABETALOL HCL 5 MG/ML IV SOLN: 10.0000 mg | INTRAVENOUS | Status: AC | PRN

## 2020-01-05 MED ORDER — LIDOCAINE HCL (PF) 1 % IJ SOLN
INTRAMUSCULAR | Status: AC
  Filled 2020-01-05: qty 30

## 2020-01-05 MED ORDER — FUROSEMIDE 10 MG/ML IJ SOLN
10.0000 mg/h | INTRAVENOUS | Status: DC
  Administered 2020-01-05: 12 mg/h via INTRAVENOUS
  Administered 2020-01-06: 10 mg/h via INTRAVENOUS
  Administered 2020-01-06: 12 mg/h via INTRAVENOUS
  Filled 2020-01-05 (×3): qty 20

## 2020-01-05 MED ORDER — VERAPAMIL HCL 2.5 MG/ML IV SOLN
INTRAVENOUS | Status: AC
  Filled 2020-01-05: qty 2

## 2020-01-05 MED ORDER — LIDOCAINE HCL (PF) 1 % IJ SOLN
INTRAMUSCULAR | Status: DC | PRN
  Administered 2020-01-05: 1 mL
  Administered 2020-01-05: 2 mL

## 2020-01-05 MED ORDER — HYDRALAZINE HCL 20 MG/ML IJ SOLN: 10.0000 mg | INTRAMUSCULAR | Status: AC | PRN

## 2020-01-05 MED ORDER — MILRINONE LACTATE IN DEXTROSE 20-5 MG/100ML-% IV SOLN: 0.2500 ug/kg/min | INTRAVENOUS | Status: DC

## 2020-01-05 MED ORDER — SODIUM CHLORIDE 0.9 % IV SOLN: 250.0000 mL | INTRAVENOUS | Status: DC | PRN

## 2020-01-05 MED ORDER — IOHEXOL 350 MG/ML SOLN
INTRAVENOUS | Status: DC | PRN
  Administered 2020-01-05: 75 mL

## 2020-01-05 MED ORDER — MIDAZOLAM HCL 2 MG/2ML IJ SOLN
INTRAMUSCULAR | Status: DC | PRN
  Administered 2020-01-05: 1 mg via INTRAVENOUS

## 2020-01-05 MED ORDER — INSULIN ASPART 100 UNIT/ML IV SOLN
3.0000 [IU] | Freq: Three times a day (TID) | INTRAVENOUS | Status: DC
  Administered 2020-01-06: 3 [IU] via INTRAVENOUS

## 2020-01-05 MED ORDER — FENTANYL CITRATE (PF) 100 MCG/2ML IJ SOLN
INTRAMUSCULAR | Status: AC
  Filled 2020-01-05: qty 2

## 2020-01-05 MED ORDER — HEPARIN SODIUM (PORCINE) 1000 UNIT/ML IJ SOLN
INTRAMUSCULAR | Status: AC
  Filled 2020-01-05: qty 1

## 2020-01-05 MED ORDER — FUROSEMIDE 10 MG/ML IJ SOLN
INTRAMUSCULAR | Status: AC
  Filled 2020-01-05: qty 8

## 2020-01-05 MED ORDER — SODIUM CHLORIDE 0.9% FLUSH: 3.0000 mL | INTRAVENOUS | Status: DC | PRN

## 2020-01-05 MED ORDER — SODIUM CHLORIDE 0.9% FLUSH: 3.0000 mL | Freq: Two times a day (BID) | INTRAVENOUS | Status: DC

## 2020-01-05 MED ORDER — VERAPAMIL HCL 2.5 MG/ML IV SOLN
INTRAVENOUS | Status: DC | PRN
  Administered 2020-01-05: 10 mL via INTRA_ARTERIAL

## 2020-01-05 MED ORDER — MIDAZOLAM HCL 2 MG/2ML IJ SOLN
INTRAMUSCULAR | Status: AC
  Filled 2020-01-05: qty 2

## 2020-01-05 MED ORDER — FENTANYL CITRATE (PF) 100 MCG/2ML IJ SOLN
INTRAMUSCULAR | Status: DC | PRN
  Administered 2020-01-05: 25 ug via INTRAVENOUS

## 2020-01-05 MED ORDER — FUROSEMIDE 10 MG/ML IJ SOLN
INTRAMUSCULAR | Status: DC | PRN
  Administered 2020-01-05: 80 mg via INTRAVENOUS

## 2020-01-05 MED ORDER — MILRINONE LACTATE IN DEXTROSE 20-5 MG/100ML-% IV SOLN
0.1250 ug/kg/min | INTRAVENOUS | Status: DC
  Administered 2020-01-05 – 2020-01-10 (×11): 0.25 ug/kg/min via INTRAVENOUS
  Administered 2020-01-12: 05:00:00 0.125 ug/kg/min via INTRAVENOUS
  Filled 2020-01-05 (×12): qty 100

## 2020-01-05 MED ORDER — ONDANSETRON HCL 4 MG/2ML IJ SOLN
4.0000 mg | Freq: Four times a day (QID) | INTRAMUSCULAR | Status: DC | PRN
  Administered 2020-01-10 – 2020-01-11 (×3): 4 mg via INTRAVENOUS
  Filled 2020-01-05 (×3): qty 2

## 2020-01-05 MED ORDER — ACETAMINOPHEN 325 MG PO TABS
650.0000 mg | ORAL_TABLET | ORAL | Status: DC | PRN
  Administered 2020-01-10 (×2): 650 mg via ORAL
  Filled 2020-01-05 (×2): qty 2

## 2020-01-05 SURGICAL SUPPLY — 12 items
CATH 5FR JL3.5 JR4 ANG PIG MP (CATHETERS) ×1 IMPLANT
CATH BALLN WEDGE 5F 110CM (CATHETERS) ×1 IMPLANT
DEVICE RAD COMP TR BAND LRG (VASCULAR PRODUCTS) ×1 IMPLANT
GLIDESHEATH SLEND SS 6F .021 (SHEATH) ×1 IMPLANT
GUIDEWIRE INQWIRE 1.5J.035X260 (WIRE) IMPLANT
INQWIRE 1.5J .035X260CM (WIRE) ×4
KIT HEART LEFT (KITS) ×2 IMPLANT
PACK CARDIAC CATHETERIZATION (CUSTOM PROCEDURE TRAY) ×2 IMPLANT
SHEATH GLIDE SLENDER 4/5FR (SHEATH) ×1 IMPLANT
SHEATH PROBE COVER 6X72 (BAG) ×1 IMPLANT
TRANSDUCER W/STOPCOCK (MISCELLANEOUS) ×2 IMPLANT
WIRE EMERALD 3MM-J .025X260CM (WIRE) ×1 IMPLANT

## 2020-01-05 NOTE — Interval H&P Note (Signed)
History and Physical Interval Note:  01/06/2020 1:24 PM  Corey Palmer  has presented today for surgery, with the diagnosis of heart failure, VT.  The various methods of treatment have been discussed with the patient and family. After consideration of risks, benefits and other options for treatment, the patient has consented to  Procedure(s): RIGHT/LEFT HEART CATH AND CORONARY ANGIOGRAPHY (N/A) as a surgical intervention.  [name] history has been reviewed, patient examined, no change in status, stable for surgery.  I have reviewed the patient's chart and labs.  Questions were answered to the patient's satisfaction.     Atha Mcbain [employer]

## 2020-01-05 NOTE — Progress Notes (Signed)
ANTICOAGULATION CONSULT NOTE - Follow Up Consult  Pharmacy Consult for heparin  Indication: atrial fibrillation  Allergies  Allergen Reactions  . Orange Fruit Anaphylaxis, Hives and Other (See Comments)    Per Pt- Blisters around lips and Hives all over, also  . Penicillins Anaphylaxis    Did it involve swelling of the face/tongue/throat, SOB, or low BP? Yes Did it involve sudden or severe rash/hives, skin peeling, or any reaction on the inside of your mouth or nose? Yes Did you need to seek medical attention at a hospital or doctor's office? No When did it last happen?childhood If all above answers are "NO", may proceed with cephalosporin use.  [name] [Empagliflozin] Itching  . Basaglar Kwikpen [Insulin Glargine] Nausea And Vomiting    Patient Measurements: Height: 6\' 3"  (190.5 cm) Weight: 107.8 kg (237 lb 9.6 oz) IBW/kg (Calculated) : 84.5 Heparin Dosing Weight: 110kg   Vital Signs: Temp: 97.8 F (36.6 C) (12/08 1105) Temp Source: Oral (12/08 1105) BP: 141/107 (12/08 1432) Pulse Rate: 96 (12/08 1432)  Labs: Recent Labs    01/03/20 0513 01/04/20 0337 01/04/20 0440 01/04/20 0440 01/04/20 1017 01/04/20 1423 01/24/2020 0411  HGB   < >  --  9.5*   < > 13.2  --  14.6  HCT   < >  --  28.1*  --  39.1  --  45.7  PLT   < >  --  138*  --  221  --  246  APTT  --   --  75*  --   --   --  72*  HEPARINUNFRC  --   --   --   --   --   --  0.53  CREATININE  --  1.76*  --   --   --  1.73* 1.83*   < > = values in this interval not displayed.    Estimated Creatinine Clearance: 63.4 mL/min (A) (by C-G formula based on SCr of 1.83 mg/dL (H)).   Medical History: Past Medical History:  Diagnosis Date  . Anginal pain ([place])   . Anxiety   . Arthritis   . Automatic implantable cardioverter-defibrillator in situ 2007   Medtronic   . Bipolar disorder ([place])   . CAD (coronary artery disease)    Cardiac cath (08/19/2000) - Nonobstructive, 40% stenosis of LAD,.   . Cancer [facility])  2013   benign stomach cancer  . CHF (congestive heart failure) ([place])   . Chondrocalcinosis of right knee 06/11/2013  . Chronic systolic heart failure ([place])    2D echo (84/1324) - Systolic function severely reduced., LV EF 20-25%. Akinesis of apical and anteroseptal mycoardium.  last 2D echo (11/2008 ), LV EF 25-30%, diffuse hypokinesis, and grade 1 diastolic dysfunction.  . Depression   . Diabetes uncomplicated adult-type II    on insulin therapy, a1c 12.9 in 01/2011  . Dilated cardiomyopathy ([place])    status post ICD placement in 2008 c/b tearing at the atrial junction resulting in tamponade and urgent thoracotomy  . Dyslipidemia   . Dysrhythmia   . GERD (gastroesophageal reflux disease)   . Gout, unspecified   . Hepatic steatosis 2011   seen on ultrasound.   . History of kidney stones   . Hx of echocardiogram    Echo (04/2013):  Mild LVH, EF 20-25%, mild LAE, mild to mod RVE with mild reduced RVSF.  Marland Kitchen Hypertension   . Obesity (BMI 30.0-34.9)   . Pacemaker   . Paroxysmal atrial fibrillation (HCC)  on chronic coumadin, goal INR 2-3. status post direct current  . Peptic ulcer   . Shortness of breath   . Sleep apnea    does not have CPAP    Assessment: 48yom with atrial fibrillation on apixaban prior to admission.  Admit for HF exacerbation and plan for RHC when renal function improves.   Will hold apixaban and treat with heparin for cath.   Last apixaban 12/6 at 8 am, will use aptt to dose heparin as apixaban interferes with the heparin levels.   Heparin drip 1300 uts/hr; aPTT within goal range today.  Heparin level 0.53 - apixaban influence may be negligible.  Heparin was turned off this morning in anticipation of cath.  Pharmacy now asked to resume 8 hrs after cath.  Sheath removed ~ 1430 PM.  Goal of Therapy:  aPTT 66-103 seconds seconds Monitor platelets by anticoagulation protocol: Yes   Plan:  Resume heparin 1300 units/hr at 2230 PM. Check heparin level/aPTT 6 hrs  after gtt resumes. Daily APTT/heparin level and CBC Monitor s/s bleeding    [name], [name] Reason, [facility] Clinical Pharmacist  12/31/2019 2:44 PM   [facility] pharmacy phone numbers are listed on [email or web address]

## 2020-01-05 NOTE — Progress Notes (Signed)
ANTICOAGULATION CONSULT NOTE - Follow Up Consult  Pharmacy Consult for heparin  Indication: atrial fibrillation  Allergies  Allergen Reactions  . Orange Fruit Anaphylaxis, Hives and Other (See Comments)    Per Pt- Blisters around lips and Hives all over, also  . Penicillins Anaphylaxis    Did it involve swelling of the face/tongue/throat, SOB, or low BP? Yes Did it involve sudden or severe rash/hives, skin peeling, or any reaction on the inside of your mouth or nose? Yes Did you need to seek medical attention at a hospital or doctor's office? No When did it last happen?childhood If all above answers are "NO", may proceed with cephalosporin use.  [name] [Empagliflozin] Itching  . Basaglar Kwikpen [Insulin Glargine] Nausea And Vomiting    Patient Measurements: Height: 6\' 3"  (190.5 cm) Weight: 107.8 kg (237 lb 9.6 oz) IBW/kg (Calculated) : 84.5 Heparin Dosing Weight: 110kg   Vital Signs: Temp: 98.2 F (36.8 C) (12/08 0715) Temp Source: Oral (12/08 0715) BP: 124/90 (12/08 0800) Pulse Rate: 95 (12/08 0800)  Labs: Recent Labs    01/03/20 0513 01/04/20 0337 01/04/20 0440 01/04/20 0440 01/04/20 1017 01/04/20 1423 01/17/2020 0411  HGB   < >  --  9.5*   < > 13.2  --  14.6  HCT   < >  --  28.1*  --  39.1  --  45.7  PLT   < >  --  138*  --  221  --  246  APTT  --   --  75*  --   --   --  72*  HEPARINUNFRC  --   --   --   --   --   --  0.53  CREATININE  --  1.76*  --   --   --  1.73* 1.83*   < > = values in this interval not displayed.    Estimated Creatinine Clearance: 63.4 mL/min (A) (by C-G formula based on SCr of 1.83 mg/dL (H)).   Medical History: Past Medical History:  Diagnosis Date  . Anginal pain ([place])   . Anxiety   . Arthritis   . Automatic implantable cardioverter-defibrillator in situ 2007   Medtronic   . Bipolar disorder ([place])   . CAD (coronary artery disease)    Cardiac cath (08/19/2000) - Nonobstructive, 40% stenosis of LAD,.   . Cancer [facility])  2013   benign stomach cancer  . CHF (congestive heart failure) ([place])   . Chondrocalcinosis of right knee 06/11/2013  . Chronic systolic heart failure ([place])    2D echo (16/1096) - Systolic function severely reduced., LV EF 20-25%. Akinesis of apical and anteroseptal mycoardium.  last 2D echo (11/2008 ), LV EF 25-30%, diffuse hypokinesis, and grade 1 diastolic dysfunction.  . Depression   . Diabetes uncomplicated adult-type II    on insulin therapy, a1c 12.9 in 01/2011  . Dilated cardiomyopathy ([place])    status post ICD placement in 2008 c/b tearing at the atrial junction resulting in tamponade and urgent thoracotomy  . Dyslipidemia   . Dysrhythmia   . GERD (gastroesophageal reflux disease)   . Gout, unspecified   . Hepatic steatosis 2011   seen on ultrasound.   . History of kidney stones   . Hx of echocardiogram    Echo (04/2013):  Mild LVH, EF 20-25%, mild LAE, mild to mod RVE with mild reduced RVSF.  Marland Kitchen Hypertension   . Obesity (BMI 30.0-34.9)   . Pacemaker   . Paroxysmal atrial fibrillation (HCC)  on chronic coumadin, goal INR 2-3. status post direct current  . Peptic ulcer   . Shortness of breath   . Sleep apnea    does not have CPAP      Assessment: 48yom with atrial fibrillation on apixaban prior to admission.  Admit for HF exacerbation and plan for RHC when renal function improves.   Will hold apixaban and treat with heparin for cath.   Last apixaban 12/6 at 8 am, will use aptt to dose heparin as apixaban interferes with the heparin levels.   Heparin drip 1300 uts/hr; aPTT within goal range today.  Heparin level 0.53 - apixaban influence may be negligible.  Goal of Therapy:  aPTT 66-103 seconds seconds Monitor platelets by anticoagulation protocol: Yes   Plan:  Continue heparin drip rate 1300 uts/hr Daily APTT/heparin level and CBC Monitor s/s bleeding  Resume Eliquis after cath today?  [name], [name] Reason, [facility] Clinical Pharmacist  01/23/2020 8:18  AM   [facility] pharmacy phone numbers are listed on [email or web address]

## 2020-01-05 NOTE — Progress Notes (Addendum)
Patient ID: Corey Palmer, male   DOB: [birth year], 49 y.o.   MRN: [id number]     Advanced Heart Failure Rounding Note  PCP-Cardiologist: No primary care provider on file.   Subjective:    12/7 Torsades -ICD shock x1. K/Mag replaced. Lasix drip stopped. Milrinone was cut back to 0.125 mcg.   Remains on amio drip at 60 mg per hour.   Denies SOB.   Objective:   Weight Range: 107.8 kg Body mass index is 29.7 kg/m.   Vital Signs:   Temp:  [97.5 F (36.4 C)-98.6 F (37 C)] 98.2 F (36.8 C) (12/08 0715) Pulse Rate:  [84-120] 93 (12/08 0715) Resp:  [16-20] 20 (12/08 0031) BP: (111-134)/(82-97) 118/88 (12/08 0715) SpO2:  [86 %-96 %] 94 % (12/08 0715) Weight:  [107.8 kg] 107.8 kg (12/08 0500) Last BM Date: 01/04/20  Weight change: Filed Weights   01/02/20 0000 01/03/20 0501 01/23/2020 0500  Weight: 117.6 kg 115.2 kg 107.8 kg    Intake/Output:   Intake/Output Summary (Last 24 hours) at 01/16/2020 0800 Last data filed at 01/01/2020 0300 Gross per 24 hour  Intake 2829.38 ml  Output 3376 ml  Net -546.62 ml      Physical Exam  CVP 11-12  General:  No resp difficulty HEENT: normal Neck: supple. JVP 11-12 . Carotids 2+ bilat; no bruits. No lymphadenopathy or thryomegaly appreciated. Cor: PMI nondisplaced. Regular rate & rhythm. No rubs, gallops or murmurs. Lungs: clear Abdomen: soft, nontender, nondistended. No hepatosplenomegaly. No bruits or masses. Good bowel sounds. Extremities: no cyanosis, clubbing, rash, edema Neuro: alert & orientedx3, cranial nerves grossly intact. moves all 4 extremities w/o difficulty. Affect pleasant   Telemetry    NSR 90s no VT overnight. Get EKG today.    Labs    CBC Recent Labs    01/04/20 1017 01/22/2020 0411  WBC 7.4 10.9*  HGB 13.2 14.6  HCT 39.1 45.7  MCV 80.6 82.0  PLT 221 364   Basic Metabolic Panel Recent Labs    01/04/20 0337 01/04/20 0337 01/04/20 1423 01/20/2020 0411  NA 130*   < > 130* 128*  K 2.8*   < > 3.8 4.5   CL 81*   < > 83* 81*  CO2 32   < > 35* 30  GLUCOSE 417*   < > 299* 426*  BUN 37*   < > 39* 44*  CREATININE 1.76*   < > 1.73* 1.83*  CALCIUM 8.6*   < > 9.0 8.6*  MG 1.7  --   --  2.2   < > = values in this interval not displayed.   Liver Function Tests No results for input(s): AST, ALT, ALKPHOS, BILITOT, PROT, ALBUMIN in the last 72 hours. No results for input(s): LIPASE, AMYLASE in the last 72 hours. Cardiac Enzymes No results for input(s): CKTOTAL, CKMB, CKMBINDEX, TROPONINI in the last 72 hours.  BNP: BNP (last 3 results) Recent Labs    01/27/2020 1619  BNP 577.5*    ProBNP (last 3 results) No results for input(s): PROBNP in the last 8760 hours.   D-Dimer No results for input(s): DDIMER in the last 72 hours. Hemoglobin A1C Recent Labs    01/03/20 1541  HGBA1C 10.6*   Fasting Lipid Panel No results for input(s): CHOL, HDL, LDLCALC, TRIG, CHOLHDL, LDLDIRECT in the last 72 hours. Thyroid Function Tests No results for input(s): TSH, T4TOTAL, T3FREE, THYROIDAB in the last 72 hours.  Invalid input(s): FREET3  Other results:   Imaging  No results found.   Medications:     Scheduled Medications: . sodium chloride   Intravenous Once  . atorvastatin  80 mg Oral Daily  . busPIRone  10 mg Oral TID  . Chlorhexidine Gluconate Cloth  6 each Topical Daily  . colchicine  0.6 mg Oral Daily  . dapagliflozin propanediol  10 mg Oral Daily  . dicyclomine  10 mg Oral TID AC  . digoxin  0.125 mg Oral Daily  . divalproex  500 mg Oral QHS  . FLUoxetine  40 mg Oral BID  . insulin aspart  0-15 Units Subcutaneous TID WC  . insulin aspart  0-5 Units Subcutaneous QHS  . insulin glargine  10 Units Subcutaneous Daily  . magnesium oxide  200 mg Oral Daily  . predniSONE  40 mg Oral Q breakfast  . sodium chloride flush  10-40 mL Intracatheter Q12H  . sodium chloride flush  3 mL Intravenous Q12H  . sodium chloride flush  3 mL Intravenous Q12H  . spironolactone  12.5 mg Oral  Daily    Infusions: . sodium chloride    . sodium chloride    . sodium chloride 10 mL/hr at 01/01/2020 0538  . amiodarone 60 mg/hr (01/04/20 2005)  . heparin 1,300 Units/hr (01/17/2020 0300)  . milrinone 0.12 mcg/kg/min (01/10/2020 0300)    PRN Medications: sodium chloride, sodium chloride, acetaminophen, ondansetron (ZOFRAN) IV, sodium chloride flush, sodium chloride flush, sodium chloride flush, traMADol     Assessment/Plan   1. Atrial fibrillation: H/o PAF.  He was on dofetilide in the remote past but this was stopped due to noncompliance. He had an upper GI bleed from antral ulcers in 3/12. He was seen by GI and was deemed safe to restart anticoagulation as long as he remains on a PPI.  No apparent recurrence of AF until just prior to this admission, was cardioverted back to NSR in ER but has gone back to atrial fibrillation - Looks like he is back in NSR today. Get EKG.  - Continue heparin gtt for possible cath. Transition back to Eliquis after procedures.  - Decrease amiodarone to 30 mg/hr today.  - Will plan LHC/RHC today.  2. CAD: Nonobstructive mild CAD on prior cath. Continue atorvastatin.  He has had chest pain but is pleuritic.  HS-TnI 64 => 92, suspect demand ischemia with volume overload and afib/RVR.   No cath since 2002, minimal CAD at that point.  - Continue atorvastatin.  - RHC/LHC today. Continue on  heparin gtt.  3. Acute on chronic systolic CHF: Nonischemic cardiomyopathy. Possible familial cardiomyopathy as both parents had cardiomyopathy and died at around 40.However, [employer] gene testing did not show any common mutation for cardiomyopathy.St Jude ICD. Echo in 8/20 with EF 15% and mildly decreased RV function. Echo this admission with EF < 20%, severe LV dilation, RV normal, severe LAE, no significant MR.Low output HF with markedly low EF.  He has a long history of cardiomyopathy (20 yrs), tends to minimize symptoms.  Cardiorenal syndrome with creatinine up to 2.2,  now stabilized on milrinone.   - CO-OX 72%. Stop milrinone.  - CVP 11-12. Hold lasix and adjust diuretics after cath.  - Continue dig 0.125 mg daily.   - Hold Entresto and spironolactone with creatinine still high 1.8.  - Continue dapagliflozin.  - Coreg held with low output.   - Continue Unna boots.  - Concerned about his trajectory long-term. He may be nearing the need for advanced therapies.  Think he would be  a potential LVAD or transplant candidate if creatinine settles out.  BMI is about 33 so not markedly high.  Will check blood type. When renal function is stabilized, will need right/left heart cath (no cath since 2002).  4. OSA: OSA on sleep study, has not been using CPAP.  - Start CPAP here.  5. AKI on CKD stage ?3: Last creatinine prior to admission was 1.38.  Creatinine peaked 2.2 this admission.  Suspect cardiorenal syndrome. - stable today at 1.87. Holding diuretics as outlined above 6. Diabetes: hgbA1c 10.6.  - Insulin sliding scale.  - Consult diabetes coordinator.  - Followed by Dr Loanne Drilling.  7. Hyponatremia: Hypervolemic hyponatremia: Fluid restrict.  8. Torsades/ICD shock: 12/7/212 in hospital event, in setting of severe hypokalemia and hypomagnesemia, K 2.8, Mg 1.7. QTc prolonged 590 ms K and Mag stable.  - continue IV amio  9. Gout: h/o gout. Complains of rt knee pain c/w previous flares - treat empirically w/ prednisone  -Continue colchicine 0.6 (takes with relief at home).   Consult cardiac rehab.  Length of Stay: [place], NP  01/16/2020, 8:00 AM  Advanced Heart Failure Team Pager [phone] (M-F; 7a - 4p)  Please contact [facility] for night-coverage after hours (4p -7a ) and weekends on [email or web address]  Patient seen with NP, agree with the above note.   CVP 11 today, co-ox 72%.  Creatinine fairly stable at 1.83.  He has gone back into NSR.   General: NAD Neck: JVP 10 cm, no thyromegaly or thyroid nodule.  Lungs: Clear to auscultation bilaterally with  normal respiratory effort. CV: Nondisplaced PMI.  Heart regular S1/S2, no S3/S4, no murmur.  No peripheral edema.   Abdomen: Soft, nontender, no hepatosplenomegaly, no distention.  Skin: Intact without lesions or rashes.  Neurologic: Alert and oriented x 3.  Psych: Normal affect. Extremities: No clubbing or cyanosis.  HEENT: Normal.   Back in NSR.  Will switch back to apixaban after cath.  Decrease amiodarone back to 30 mg/hr.   CVP 10-11, holding Lasix today for cath.  Good co-ox, will stop milrinone pre-cath.   Gout pain better on prednisone + colchicine.   R/L heart cath today.  Will need future consideration of advanced therapies.   Loralie Champagne 01/10/2020 9:02 AM  RHC/LHC with 3 vessel disease, low output off milrinone.  Still significant volume overload.   Patient will get Lasix 80 mg IV bolus then start Lasix 12 mg/hr.  He will need to restart milrinone at 0.25 mcg/kg/min.  Will send to [place], follow CVP and co-ox.  Continue IV amiodarone and will restart IV heparin rather than apixaban given suspected need for further interventions.   I reviewed his cath films with interventional colleagues.  From a standpoint of anatomy, best option would be CABG.  However, he has low output HF and marked volume overload currently.  Would be high risk for CABG.  Will ask TCTS to see.  Once he is hemodynamically optimized (?consider Impella 5.5), will need to decide on CABG versus multivessel PCI (PCI feasible but would require multiple stents and would not be complete).  Given known component of nonischemic cardiomyopathy that likely would not improve post-revascularization, should also consider LVAD placement.   Loralie Champagne 01/26/2020 3:34 PM

## 2020-01-05 NOTE — Progress Notes (Signed)
Inpatient Diabetes Program Recommendations  AACE/ADA: New Consensus Statement on Inpatient Glycemic Control (2015)  Target Ranges:  Prepandial:   less than 140 mg/dL      Peak postprandial:   less than 180 mg/dL (1-2 hours)      Critically ill patients:  140 - 180 mg/dL   Lab Results  Component Value Date   GLUCAP 188 (H) 01/08/2020   HGBA1C 10.6 (H) 01/03/2020    Review of Glycemic Control Results for [name], [name] (MRN [id number]) as of 01/21/2020 09:32  Ref. Range 01/04/2020 16:11 01/04/2020 21:24 01/25/2020 06:30  Glucose-Capillary Latest Ref Range: 70 - 99 mg/dL 307 (H) 294 (H) 188 (H)   Diabetes history: DM2 Outpatient Diabetes medications: Lantus 40 units QAM (due to insurance issue has not had Lantus since mid October 2021), Farxiga 10 mg daily, Novolog 5-50 units TID, Ozempic 0.5 mg Qweek (Sunday) Current orders for Inpatient glycemic control: Lantus 10 units daily, Novolog 0-15 units TID with meals, Novolog 0-5 units QHS, Farxiga 10 mg daily; Prednisone 40 mg QAM Prednisone 40 mg QAM  Inpatient Diabetes Program Recommendations:    If steroids are to continued, consider adding Novolog 3 units TID (assuming patient is consuming >50% of meals). In agreement with addition of Lantus.   HbgA1C:  A1C 10.6% on 01/03/20 indicating an average glucose of 258 mg/dl over the past 2-3 months. At time of discharge, please provide Rx for Toujeo (insulin glargine).     Addendum 01/04/20@12 :20:  "Patient reports he is not able to get Lantus insulin due to insurance not covering. Checked chart and it appears that Nelva Nay is covered under current insurance in chart. Would recommend discharging patient on Toujeo insulin and have patient follow up with Dr. Loanne Drilling. "   Thanks, Bronson Curb, MSN, RNC-OB Diabetes Coordinator [phone] (8a-5p)

## 2020-01-05 NOTE — H&P (View-Only) (Signed)
Patient ID: Corey Palmer, male   DOB: January 10, 1971, 49 y.o.   MRN: [id number]     Advanced Heart Failure Rounding Note  PCP-Cardiologist: No primary care provider on file.   Subjective:    12/7 Torsades -ICD shock x1. K/Mag replaced. Lasix drip stopped. Milrinone was cut back to 0.125 mcg.   Remains on amio drip at 60 mg per hour.   Denies SOB.   Objective:   Weight Range: 107.8 kg Body mass index is 29.7 kg/m.   Vital Signs:   Temp:  [97.5 F (36.4 C)-98.6 F (37 C)] 98.2 F (36.8 C) (12/08 0715) Pulse Rate:  [84-120] 93 (12/08 0715) Resp:  [16-20] 20 (12/08 0031) BP: (111-134)/(82-97) 118/88 (12/08 0715) SpO2:  [86 %-96 %] 94 % (12/08 0715) Weight:  [107.8 kg] 107.8 kg (12/08 0500) Last BM Date: 01/04/20  Weight change: Filed Weights   01/02/20 0000 01/03/20 0501 01/02/2020 0500  Weight: 117.6 kg 115.2 kg 107.8 kg    Intake/Output:   Intake/Output Summary (Last 24 hours) at 01/20/2020 0800 Last data filed at 01/18/2020 0300 Gross per 24 hour  Intake 2829.38 ml  Output 3376 ml  Net -546.62 ml      Physical Exam  CVP 11-12  General:  No resp difficulty HEENT: normal Neck: supple. JVP 11-12 . Carotids 2+ bilat; no bruits. No lymphadenopathy or thryomegaly appreciated. Cor: PMI nondisplaced. Regular rate & rhythm. No rubs, gallops or murmurs. Lungs: clear Abdomen: soft, nontender, nondistended. No hepatosplenomegaly. No bruits or masses. Good bowel sounds. Extremities: no cyanosis, clubbing, rash, edema Neuro: alert & orientedx3, cranial nerves grossly intact. moves all 4 extremities w/o difficulty. Affect pleasant   Telemetry    NSR 90s no VT overnight. Get EKG today.    Labs    CBC Recent Labs    01/04/20 1017 01/02/2020 0411  WBC 7.4 10.9*  HGB 13.2 14.6  HCT 39.1 45.7  MCV 80.6 82.0  PLT 221 712   Basic Metabolic Panel Recent Labs    01/04/20 0337 01/04/20 0337 01/04/20 1423 01/02/2020 0411  NA 130*   < > 130* 128*  K 2.8*   < > 3.8 4.5   CL 81*   < > 83* 81*  CO2 32   < > 35* 30  GLUCOSE 417*   < > 299* 426*  BUN 37*   < > 39* 44*  CREATININE 1.76*   < > 1.73* 1.83*  CALCIUM 8.6*   < > 9.0 8.6*  MG 1.7  --   --  2.2   < > = values in this interval not displayed.   Liver Function Tests No results for input(s): AST, ALT, ALKPHOS, BILITOT, PROT, ALBUMIN in the last 72 hours. No results for input(s): LIPASE, AMYLASE in the last 72 hours. Cardiac Enzymes No results for input(s): CKTOTAL, CKMB, CKMBINDEX, TROPONINI in the last 72 hours.  BNP: BNP (last 3 results) Recent Labs    01/02/2020 1619  BNP 577.5*    ProBNP (last 3 results) No results for input(s): PROBNP in the last 8760 hours.   D-Dimer No results for input(s): DDIMER in the last 72 hours. Hemoglobin A1C Recent Labs    01/03/20 1541  HGBA1C 10.6*   Fasting Lipid Panel No results for input(s): CHOL, HDL, LDLCALC, TRIG, CHOLHDL, LDLDIRECT in the last 72 hours. Thyroid Function Tests No results for input(s): TSH, T4TOTAL, T3FREE, THYROIDAB in the last 72 hours.  Invalid input(s): FREET3  Other results:   Imaging  No results found.   Medications:     Scheduled Medications: . sodium chloride   Intravenous Once  . atorvastatin  80 mg Oral Daily  . busPIRone  10 mg Oral TID  . Chlorhexidine Gluconate Cloth  6 each Topical Daily  . colchicine  0.6 mg Oral Daily  . dapagliflozin propanediol  10 mg Oral Daily  . dicyclomine  10 mg Oral TID AC  . digoxin  0.125 mg Oral Daily  . divalproex  500 mg Oral QHS  . FLUoxetine  40 mg Oral BID  . insulin aspart  0-15 Units Subcutaneous TID WC  . insulin aspart  0-5 Units Subcutaneous QHS  . insulin glargine  10 Units Subcutaneous Daily  . magnesium oxide  200 mg Oral Daily  . predniSONE  40 mg Oral Q breakfast  . sodium chloride flush  10-40 mL Intracatheter Q12H  . sodium chloride flush  3 mL Intravenous Q12H  . sodium chloride flush  3 mL Intravenous Q12H  . spironolactone  12.5 mg Oral  Daily    Infusions: . sodium chloride    . sodium chloride    . sodium chloride 10 mL/hr at 01/01/2020 0538  . amiodarone 60 mg/hr (01/04/20 2005)  . heparin 1,300 Units/hr (01/26/2020 0300)  . milrinone 0.12 mcg/kg/min (01/02/2020 0300)    PRN Medications: sodium chloride, sodium chloride, acetaminophen, ondansetron (ZOFRAN) IV, sodium chloride flush, sodium chloride flush, sodium chloride flush, traMADol     Assessment/Plan   1. Atrial fibrillation: H/o PAF.  He was on dofetilide in the remote past but this was stopped due to noncompliance. He had an upper GI bleed from antral ulcers in 3/12. He was seen by GI and was deemed safe to restart anticoagulation as long as he remains on a PPI.  No apparent recurrence of AF until just prior to this admission, was cardioverted back to NSR in ER but has gone back to atrial fibrillation - Looks like he is back in NSR today. Get EKG.  - Continue heparin gtt for possible cath. Transition back to Eliquis after procedures.  - Decrease amiodarone to 30 mg/hr today.  - Will plan LHC/RHC today.  2. CAD: Nonobstructive mild CAD on prior cath. Continue atorvastatin.  He has had chest pain but is pleuritic.  HS-TnI 64 => 92, suspect demand ischemia with volume overload and afib/RVR.   No cath since 2002, minimal CAD at that point.  - Continue atorvastatin.  - RHC/LHC today. Continue on  heparin gtt.  3. Acute on chronic systolic CHF: Nonischemic cardiomyopathy. Possible familial cardiomyopathy as both parents had cardiomyopathy and died at around 36.However, [employer] gene testing did not show any common mutation for cardiomyopathy.[employer] Jude ICD. Echo in 8/20 with EF 15% and mildly decreased RV function. Echo this admission with EF < 20%, severe LV dilation, RV normal, severe LAE, no significant MR.Low output HF with markedly low EF.  He has a long history of cardiomyopathy (20 yrs), tends to minimize symptoms.  Cardiorenal syndrome with creatinine up to 2.2,  now stabilized on milrinone.   - CO-OX 72%. Stop milrinone.  - CVP 11-12. Hold lasix and adjust diuretics after cath.  - Continue dig 0.125 mg daily.   - Hold Entresto and spironolactone with creatinine still high 1.8.  - Continue dapagliflozin.  - Coreg held with low output.   - Continue Unna boots.  - Concerned about his trajectory long-term. He may be nearing the need for advanced therapies.  Think he would be  a potential LVAD or transplant candidate if creatinine settles out.  BMI is about 33 so not markedly high.  Will check blood type. When renal function is stabilized, will need right/left heart cath (no cath since 2002).  4. OSA: OSA on sleep study, has not been using CPAP.  - Start CPAP here.  5. AKI on CKD stage ?3: Last creatinine prior to admission was 1.38.  Creatinine peaked 2.2 this admission.  Suspect cardiorenal syndrome. - stable today at 1.87. Holding diuretics as outlined above 6. Diabetes: hgbA1c 10.6.  - Insulin sliding scale.  - Consult diabetes coordinator.  - Followed by Dr Loanne Drilling.  7. Hyponatremia: Hypervolemic hyponatremia: Fluid restrict.  8. Torsades/ICD shock: 12/7/212 in hospital event, in setting of severe hypokalemia and hypomagnesemia, K 2.8, Mg 1.7. QTc prolonged 590 ms K and Mag stable.  - continue IV amio  9. Gout: h/o gout. Complains of rt knee pain c/w previous flares - treat empirically w/ prednisone  -Continue colchicine 0.6 (takes with relief at home).   Consult cardiac rehab.  Length of Stay: [place], NP  01/08/2020, 8:00 AM  Advanced Heart Failure Team Pager [phone] (M-F; 7a - 4p)  Please contact [facility] for night-coverage after hours (4p -7a ) and weekends on [email or web address]  Patient seen with NP, agree with the above note.   CVP 11 today, co-ox 72%.  Creatinine fairly stable at 1.83.  He has gone back into NSR.   General: NAD Neck: JVP 10 cm, no thyromegaly or thyroid nodule.  Lungs: Clear to auscultation bilaterally with  normal respiratory effort. CV: Nondisplaced PMI.  Heart regular S1/S2, no S3/S4, no murmur.  No peripheral edema.   Abdomen: Soft, nontender, no hepatosplenomegaly, no distention.  Skin: Intact without lesions or rashes.  Neurologic: Alert and oriented x 3.  Psych: Normal affect. Extremities: No clubbing or cyanosis.  HEENT: Normal.   Back in NSR.  Will switch back to apixaban after cath.  Decrease amiodarone back to 30 mg/hr.   CVP 10-11, holding Lasix today for cath.  Good co-ox, will stop milrinone pre-cath.   Gout pain better on prednisone + colchicine.   R/L heart cath today.  Will need future consideration of advanced therapies.   Loralie Champagne 01/27/2020 9:02 AM

## 2020-01-06 ENCOUNTER — Encounter (HOSPITAL_COMMUNITY): Payer: Self-pay | Admitting: Cardiology

## 2020-01-06 ENCOUNTER — Other Ambulatory Visit (HOSPITAL_COMMUNITY): Payer: BC Managed Care – PPO

## 2020-01-06 DIAGNOSIS — R570 Cardiogenic shock: Secondary | ICD-10-CM

## 2020-01-06 LAB — BASIC METABOLIC PANEL
Anion gap: 17 — ABNORMAL HIGH (ref 5–15)
Anion gap: 15 (ref 5–15)
BUN: 40 mg/dL — ABNORMAL HIGH (ref 6–20)
BUN: 42 mg/dL — ABNORMAL HIGH (ref 6–20)
CO2: 35 mmol/L — ABNORMAL HIGH (ref 22–32)
CO2: 35 mmol/L — ABNORMAL HIGH (ref 22–32)
Calcium: 8.7 mg/dL — ABNORMAL LOW (ref 8.9–10.3)
Calcium: 9 mg/dL (ref 8.9–10.3)
Chloride: 80 mmol/L — ABNORMAL LOW (ref 98–111)
Chloride: 80 mmol/L — ABNORMAL LOW (ref 98–111)
Creatinine, Ser: 1.77 mg/dL — ABNORMAL HIGH (ref 0.61–1.24)
Creatinine, Ser: 2.03 mg/dL — ABNORMAL HIGH (ref 0.61–1.24)
GFR, Estimated: 46 mL/min — ABNORMAL LOW (ref >60)
GFR, Estimated: 39 mL/min — ABNORMAL LOW (ref >60)
Glucose, Bld: 312 mg/dL — ABNORMAL HIGH (ref 70–99)
Glucose, Bld: 338 mg/dL — ABNORMAL HIGH (ref 70–99)
Potassium: 3 mmol/L — ABNORMAL LOW (ref 3.5–5.1)
Potassium: 3.9 mmol/L (ref 3.5–5.1)
Sodium: 132 mmol/L — ABNORMAL LOW (ref 135–145)
Sodium: 130 mmol/L — ABNORMAL LOW (ref 135–145)

## 2020-01-06 LAB — GLUCOSE, CAPILLARY
Glucose-Capillary: 123 mg/dL — ABNORMAL HIGH (ref 70–99)
Glucose-Capillary: 141 mg/dL — ABNORMAL HIGH (ref 70–99)
Glucose-Capillary: 321 mg/dL — ABNORMAL HIGH (ref 70–99)
Glucose-Capillary: 312 mg/dL — ABNORMAL HIGH (ref 70–99)

## 2020-01-06 LAB — CBC
HCT: 46.5 % (ref 39.0–52.0)
Hemoglobin: 14.6 g/dL (ref 13.0–17.0)
MCH: 26.3 pg (ref 26.0–34.0)
MCHC: 31.4 g/dL (ref 30.0–36.0)
MCV: 83.6 fL (ref 80.0–100.0)
Platelets: 225 10*3/uL (ref 150–400)
RBC: 5.56 MIL/uL (ref 4.22–5.81)
RDW: 15.7 % — ABNORMAL HIGH (ref 11.5–15.5)
WBC: 7.4 10*3/uL (ref 4.0–10.5)
nRBC: 0 % (ref 0.0–0.2)

## 2020-01-06 LAB — MAGNESIUM: Magnesium: 1.9 mg/dL (ref 1.7–2.4)

## 2020-01-06 LAB — COOXEMETRY PANEL
Carboxyhemoglobin: 1.4 % (ref 0.5–1.5)
Carboxyhemoglobin: 1.4 % (ref 0.5–1.5)
Methemoglobin: 0.7 % (ref 0.0–1.5)
Methemoglobin: 0.8 % (ref 0.0–1.5)
O2 Saturation: 74.3 %
O2 Saturation: 73.1 %
Total hemoglobin: 15.1 g/dL (ref 12.0–16.0)
Total hemoglobin: 14.9 g/dL (ref 12.0–16.0)

## 2020-01-06 LAB — HEPARIN LEVEL (UNFRACTIONATED)
Heparin Unfractionated: 0.19 IU/mL — ABNORMAL LOW (ref 0.30–0.70)
Heparin Unfractionated: 0.25 IU/mL — ABNORMAL LOW (ref 0.30–0.70)
Heparin Unfractionated: 0.34 IU/mL (ref 0.30–0.70)

## 2020-01-06 LAB — APTT: aPTT: 45 seconds — ABNORMAL HIGH (ref 24–36)

## 2020-01-06 MED ORDER — ACETAZOLAMIDE 250 MG PO TABS
250.0000 mg | ORAL_TABLET | Freq: Two times a day (BID) | ORAL | Status: AC
  Administered 2020-01-06 (×2): 250 mg via ORAL
  Filled 2020-01-06 (×2): qty 1

## 2020-01-06 MED ORDER — MAGNESIUM SULFATE 2 GM/50ML IV SOLN
2.0000 g | Freq: Once | INTRAVENOUS | Status: AC
  Administered 2020-01-06: 2 g via INTRAVENOUS
  Filled 2020-01-06: qty 50

## 2020-01-06 MED ORDER — INSULIN ASPART 100 UNIT/ML IV SOLN
3.0000 [IU] | Freq: Three times a day (TID) | INTRAVENOUS | Status: DC
  Administered 2020-01-06 – 2020-01-08 (×6): 3 [IU] via SUBCUTANEOUS

## 2020-01-06 MED ORDER — POTASSIUM CHLORIDE 10 MEQ/50ML IV SOLN
10.0000 meq | INTRAVENOUS | Status: AC
  Administered 2020-01-06 (×4): 10 meq via INTRAVENOUS
  Filled 2020-01-06 (×5): qty 50

## 2020-01-06 MED ORDER — SPIRONOLACTONE 25 MG PO TABS
25.0000 mg | ORAL_TABLET | Freq: Every day | ORAL | Status: DC
  Administered 2020-01-06 – 2020-01-08 (×3): 25 mg via ORAL
  Filled 2020-01-06 (×3): qty 1

## 2020-01-06 MED ORDER — POTASSIUM CHLORIDE CRYS ER 20 MEQ PO TBCR
40.0000 meq | EXTENDED_RELEASE_TABLET | Freq: Two times a day (BID) | ORAL | Status: DC
  Administered 2020-01-06 (×2): 40 meq via ORAL
  Filled 2020-01-06 (×2): qty 2

## 2020-01-06 NOTE — Progress Notes (Signed)
[place] for heparin  Indication: atrial fibrillation  Assessment: 48yom with atrial fibrillation on apixaban prior to admission.  Admit for HF exacerbation and plan for RHC when renal function improves.   Will hold apixaban and treat with heparin for cath.    Heparin level therapeutic this evening  Goal of Therapy:  aPTT 66-103 seconds seconds Monitor platelets by anticoagulation protocol: Yes   Plan:  Continue heparin drip at 1600 units/hr. Daily heparin level and CBC Monitor s/s bleeding   Thanks for allowing pharmacy to be a part of this patient's care.  Excell Seltzer, PharmD Clinical Pharmacist

## 2020-01-06 NOTE — Plan of Care (Signed)
  Problem: Education: Goal: Ability to demonstrate management of disease process will improve Outcome: Progressing Goal: Ability to verbalize understanding of medication therapies will improve Outcome: Progressing Goal: Individualized Educational Video(s) Outcome: Progressing   Problem: Activity: Goal: Capacity to carry out activities will improve Outcome: Progressing   Problem: Cardiac: Goal: Ability to achieve and maintain adequate cardiopulmonary perfusion will improve Outcome: Progressing   Problem: Education: Goal: Knowledge of General Education information will improve Description: Including pain rating scale, medication(s)/side effects and non-pharmacologic comfort measures Outcome: Progressing   Problem: Health Behavior/Discharge Planning: Goal: Ability to manage health-related needs will improve Outcome: Progressing   Problem: Clinical Measurements: Goal: Ability to maintain clinical measurements within normal limits will improve Outcome: Progressing Goal: Will remain free from infection Outcome: Progressing Goal: Diagnostic test results will improve Outcome: Progressing Goal: Respiratory complications will improve Outcome: Progressing Goal: Cardiovascular complication will be avoided Outcome: Progressing   Problem: Activity: Goal: Risk for activity intolerance will decrease Outcome: Progressing   Problem: Coping: Goal: Level of anxiety will decrease Outcome: Progressing   Problem: Skin Integrity: Goal: Risk for impaired skin integrity will decrease Outcome: Progressing   Problem: Education: Goal: Ability to demonstrate management of disease process will improve Outcome: Progressing Goal: Ability to verbalize understanding of medication therapies will improve Outcome: Progressing   Problem: Activity: Goal: Capacity to carry out activities will improve Outcome: Progressing   Problem: Cardiac: Goal: Ability to achieve and maintain adequate  cardiopulmonary perfusion will improve Outcome: Progressing

## 2020-01-06 NOTE — Progress Notes (Signed)
ANTICOAGULATION CONSULT NOTE - Follow Up Consult  Pharmacy Consult for heparin  Indication: atrial fibrillation  Allergies  Allergen Reactions  . Orange Fruit Anaphylaxis, Hives and Other (See Comments)    Per Pt- Blisters around lips and Hives all over, also  . Penicillins Anaphylaxis    Did it involve swelling of the face/tongue/throat, SOB, or low BP? Yes Did it involve sudden or severe rash/hives, skin peeling, or any reaction on the inside of your mouth or nose? Yes Did you need to seek medical attention at a hospital or doctor's office? No When did it last happen?childhood If all above answers are "NO", may proceed with cephalosporin use.  [name] [Empagliflozin] Itching  . Basaglar Kwikpen [Insulin Glargine] Nausea And Vomiting    Patient Measurements: Height: 6\' 3"  (190.5 cm) Weight: 103.9 kg (229 lb 0.9 oz) IBW/kg (Calculated) : 84.5 Heparin Dosing Weight: 110kg   Vital Signs: Temp: 98 F (36.7 C) (12/09 1212) Temp Source: Oral (12/09 1212) BP: 137/99 (12/09 1300) Pulse Rate: 111 (12/09 1300)  Labs: Recent Labs    01/04/20 0440 01/04/20 1017 01/04/20 1423 01/15/2020 0411 01/14/2020 1358 01/17/2020 1404 01/06/20 0424 01/06/20 0607 01/06/20 1339  HGB 9.5* 13.2  --  14.6 17.3* 17.7* 14.6  --   --   HCT 28.1* 39.1  --  45.7 51.0 52.0 46.5  --   --   PLT 138* 221  --  246  --   --  225  --   --   APTT 75*  --   --  72*  --   --  45*  --   --   HEPARINUNFRC  --   --   --  0.53  --   --   --  0.19* 0.25*  CREATININE  --   --  1.73* 1.83*  --   --  1.77*  --   --     Estimated Creatinine Clearance: 64.5 mL/min (A) (by C-G formula based on SCr of 1.77 mg/dL (H)).   Medical History: Past Medical History:  Diagnosis Date  . Anginal pain ([place])   . Anxiety   . Arthritis   . Automatic implantable cardioverter-defibrillator in situ 2007   [employer]   . Bipolar disorder ([place])   . CAD (coronary artery disease)    Cardiac cath (08/19/2000) - Nonobstructive,  40% stenosis of LAD,.   . Cancer [facility]) 2013   benign stomach cancer  . CHF (congestive heart failure) ([place])   . Chondrocalcinosis of right knee 06/11/2013  . Chronic systolic heart failure ([place])    2D echo (11/9145) - Systolic function severely reduced., LV EF 20-25%. Akinesis of apical and anteroseptal mycoardium.  last 2D echo (11/2008 ), LV EF 25-30%, diffuse hypokinesis, and grade 1 diastolic dysfunction.  . Depression   . Diabetes uncomplicated adult-type II    on insulin therapy, a1c 12.9 in 01/2011  . Dilated cardiomyopathy ([place])    status post ICD placement in 2008 c/b tearing at the atrial junction resulting in tamponade and urgent thoracotomy  . Dyslipidemia   . Dysrhythmia   . GERD (gastroesophageal reflux disease)   . Gout, unspecified   . Hepatic steatosis 2011   seen on ultrasound.   . History of kidney stones   . Hx of echocardiogram    Echo (04/2013):  Mild LVH, EF 20-25%, mild LAE, mild to mod RVE with mild reduced RVSF.  Marland Kitchen Hypertension   . Obesity (BMI 30.0-34.9)   . Pacemaker   .  Paroxysmal atrial fibrillation ([facility])    on chronic coumadin, goal INR 2-3. status post direct current  . Peptic ulcer   . Shortness of breath   . Sleep apnea    does not have CPAP    Assessment: 48yom with atrial fibrillation on apixaban prior to admission.  Admit for HF exacerbation and plan for RHC when renal function improves.   Will hold apixaban and treat with heparin for cath.   Last apixaban 12/6 at 8 am, will use aptt to dose heparin as apixaban interferes with the heparin levels.   Heparin drip 1500 uts/hr; aPTT within goal range today.  Heparin level 0.25 - apixaban influence may be negligible.  Heparin was turned off this morning in anticipation of cath.  Pharmacy now asked to resume 8 hrs after cath.  Sheath removed ~ 1430 PM.  Goal of Therapy:  aPTT 66-103 seconds seconds Monitor platelets by anticoagulation protocol: Yes   Plan:  Increase IV heparin to 1600  units/hr. Check heparin level/aPTT 6 hrs after gtt increase. Daily APTT/heparin level and CBC Monitor s/s bleeding    [name], [name] Reason, [facility] Clinical Pharmacist  01/06/2020 3:22 PM   [facility] pharmacy phone numbers are listed on [email or web address]

## 2020-01-06 NOTE — Plan of Care (Signed)
  Problem: Activity: Goal: Capacity to carry out activities will improve Outcome: Progressing   Problem: Cardiac: Goal: Ability to achieve and maintain adequate cardiopulmonary perfusion will improve Outcome: Progressing   Problem: Clinical Measurements: Goal: Ability to maintain clinical measurements within normal limits will improve Outcome: Progressing Goal: Respiratory complications will improve Outcome: Progressing

## 2020-01-06 NOTE — Progress Notes (Signed)
ANTICOAGULATION CONSULT NOTE - Follow Up Consult  Pharmacy Consult for heparin  Indication: atrial fibrillation    Assessment: 48yom with atrial fibrillation on apixaban prior to admission.  Admit for HF exacerbation and plan for RHC when renal function improves.   Will hold apixaban and treat with heparin for cath.   Last apixaban 12/6 at 8 am, will use aptt to dose heparin as apixaban interferes with the heparin levels.   Heparin resumed 8 hrs after cath.  Sheath removed ~ 1430 PM. Heparin level this am 0.19 units/ml  Goal of Therapy:  Heparin level 0.3-0.7 units/ml aPTT 66-103 seconds seconds Monitor platelets by anticoagulation protocol: Yes   Plan:  Increase heparin to 1500 units/hr Check heparin level 6 hrs after rate increase Daily heparin level and CBC Monitor s/s bleeding   Thanks for allowing pharmacy to be a part of this patient's care.  Excell Seltzer, PharmD Clinical Pharmacist

## 2020-01-06 NOTE — Progress Notes (Signed)
Orthopedic Tech Progress Note Patient Details:  Corey Palmer 02-12-1970 [id number] Applied unna boots to patient Ortho Devices Type of Ortho Device: Haematologist Ortho Device/Splint Location: BLE Ortho Device/Splint Interventions: Ordered,Application   Post Interventions Patient Tolerated: Well Instructions Provided: Care of device   Petra Kuba 01/06/2020, 4:25 PM

## 2020-01-06 NOTE — Progress Notes (Addendum)
Patient ID: Corey Palmer, male   DOB: [birth year], 49 y.o.   MRN: [id number]     Advanced Heart Failure Rounding Note  PCP-Cardiologist: No primary care provider on file.   Subjective:    12/7 Torsades -ICD shock x1.   Good diuresis yesterday, I/O net negative -3455.  Co-ox 73%, CVP 10. He has been for a walk already this morning.   He is back on milrinone 0.25.  Went into atrial fibrillation again but rate is controlled on amiodarone.   RHC/LHC: Coronary Findings   Diagnostic Dominance: Right  Left Main  30% distal left main stenosis.  Left Anterior Descending  40% ostial LAD stenosis. 90% stenosis proximal LAD just distal to D1 take-off. Large D1 with 99% proximal stenosis, 60% mid vessel stenosis.  Left Circumflex  Long up to 70% proximal LCx stenosis.  Right Coronary Artery  95% mid RCA stenosis. 75% distal RCA stenosis. 60% stenosis proximal PLV. 99% stenosis mid PDA.   Intervention   No interventions have been documented.  Right Heart  Right Heart Pressures RHC Procedural Findings: Hemodynamics (mmHg) RA mean 16 RV 53/12 PA 65/29, mean 46 PCWP mean 31 LV 119/28 AO 118/87  Oxygen saturations: PA 45% AO 93%  Cardiac Output (Fick) 3.39  Cardiac Index (Fick) 1.4 PVR 4.4 WU PAPi 2.25     Objective:   Weight Range: 103.9 kg Body mass index is 28.63 kg/m.   Vital Signs:   Temp:  [97.8 F (36.6 C)-98.3 F (36.8 C)] 98.3 F (36.8 C) (12/09 0338) Pulse Rate:  [40-111] 110 (12/09 0700) Resp:  [11-30] 25 (12/09 0700) BP: (117-150)/(85-112) 133/97 (12/09 0700) SpO2:  [83 %-100 %] 97 % (12/09 0700) Weight:  [103.9 kg] 103.9 kg (12/09 0500) Last BM Date: 01/04/20  Weight change: Filed Weights   01/03/20 0501 01/04/2020 0500 01/06/20 0500  Weight: 115.2 kg 107.8 kg 103.9 kg    Intake/Output:   Intake/Output Summary (Last 24 hours) at 01/06/2020 0743 Last data filed at 01/06/2020 0700 Gross per 24 hour  Intake 1269.89 ml  Output 4725 ml  Net  -3455.11 ml      Physical Exam  CVP 10 General: NAD Neck: JVP 10 cm, no thyromegaly or thyroid nodule.  Lungs: Clear to auscultation bilaterally with normal respiratory effort. CV: Lateral PMI.  Heart irregular S1/S2, no S3/S4, no murmur.  1+ edema to knees.  Abdomen: Soft, nontender, no hepatosplenomegaly, no distention.  Skin: Intact without lesions or rashes.  Neurologic: Alert and oriented x 3.  Psych: Normal affect. Extremities: No clubbing or cyanosis.  HEENT: Normal.    Telemetry    Atrial fibrillation 90s-100s, personally reviewed    Labs    CBC Recent Labs    01/06/2020 0411 01/11/2020 1358 01/26/2020 1404 01/06/20 0424  WBC 10.9*  --   --  7.4  HGB 14.6   < > 17.7* 14.6  HCT 45.7   < > 52.0 46.5  MCV 82.0  --   --  83.6  PLT 246  --   --  225   < > = values in this interval not displayed.   Basic Metabolic Panel Recent Labs    01/06/2020 0411 01/01/2020 1358 01/25/2020 1404 01/06/20 0424  NA 128*   < > 134* 132*  K 4.5   < > 4.1 3.0*  CL 81*  --   --  80*  CO2 30  --   --  35*  GLUCOSE 426*  --   --  312*  BUN 44*  --   --  40*  CREATININE 1.83*  --   --  1.77*  CALCIUM 8.6*  --   --  8.7*  MG 2.2  --   --  1.9   < > = values in this interval not displayed.   Liver Function Tests No results for input(s): AST, ALT, ALKPHOS, BILITOT, PROT, ALBUMIN in the last 72 hours. No results for input(s): LIPASE, AMYLASE in the last 72 hours. Cardiac Enzymes No results for input(s): CKTOTAL, CKMB, CKMBINDEX, TROPONINI in the last 72 hours.  BNP: BNP (last 3 results) Recent Labs    01/03/2020 1619  BNP 577.5*    ProBNP (last 3 results) No results for input(s): PROBNP in the last 8760 hours.   D-Dimer No results for input(s): DDIMER in the last 72 hours. Hemoglobin A1C Recent Labs    01/03/20 1541  HGBA1C 10.6*   Fasting Lipid Panel No results for input(s): CHOL, HDL, LDLCALC, TRIG, CHOLHDL, LDLDIRECT in the last 72 hours. Thyroid Function Tests No  results for input(s): TSH, T4TOTAL, T3FREE, THYROIDAB in the last 72 hours.  Invalid input(s): FREET3  Other results:   Imaging    CARDIAC CATHETERIZATION  Result Date: 01/21/2020 1. Elevated right and left heart filling pressures with low cardiac output. 2. Severe 3 vessel disease. Patient will get Lasix 80 mg IV bolus then start Lasix 12 mg/hr.  He will need to restart milrinone at 0.25 mcg/kg/min.  I reviewed his cath films with interventional colleagues.  From a standpoint of anatomy, best option would be CABG.  However, he has low output HF and marked volume overload currently.  Would be a high risk CABG candidate.  Will ask TCTS to see.  Once he is hemodynamically optimized (?consider Impella 5.5), will need to decide on CABG versus multivessel PCI (PCI feasible but would require multiple stents and would not be complete).  Given known component of nonischemic cardiomyopathy that likely would not improve post-revascularization, should also consider LVAD placement.     Medications:     Scheduled Medications: . sodium chloride   Intravenous Once  . atorvastatin  80 mg Oral Daily  . busPIRone  10 mg Oral TID  . Chlorhexidine Gluconate Cloth  6 each Topical Daily  . colchicine  0.6 mg Oral Daily  . dapagliflozin propanediol  10 mg Oral Daily  . dicyclomine  10 mg Oral TID AC  . digoxin  0.125 mg Oral Daily  . divalproex  500 mg Oral QHS  . FLUoxetine  40 mg Oral BID  . insulin aspart  0-15 Units Subcutaneous TID WC  . insulin aspart  0-5 Units Subcutaneous QHS  . insulin aspart  3 Units Intravenous TID AC  . insulin glargine  10 Units Subcutaneous Daily  . magnesium oxide  200 mg Oral Daily  . potassium chloride  40 mEq Oral BID  . predniSONE  40 mg Oral Q breakfast  . sodium chloride flush  10-40 mL Intracatheter Q12H  . sodium chloride flush  3 mL Intravenous Q12H  . spironolactone  25 mg Oral Daily    Infusions: . sodium chloride    . amiodarone 30 mg/hr (01/06/20  0700)  . furosemide (LASIX) 200 mg in dextrose 5% 100 mL (2mg /mL) infusion 12 mg/hr (01/06/20 0700)  . heparin 1,500 Units/hr (01/06/20 0716)  . magnesium sulfate bolus IVPB    . milrinone 0.25 mcg/kg/min (01/06/20 0700)  . potassium chloride      PRN Medications: sodium chloride,  acetaminophen, ondansetron (ZOFRAN) IV, sodium chloride flush, traMADol     Assessment/Plan   1. Atrial fibrillation: H/o PAF.  He was on dofetilide in the remote past but this was stopped due to noncompliance. He had an upper GI bleed from antral ulcers in 3/12. He was seen by GI and was deemed safe to restart anticoagulation as long as he remains on a PPI.  No apparent recurrence of AF until just prior to this admission, was cardioverted back to NSR in ER but has gone back to atrial fibrillation - Continue amiodarone gtt 30 mg/hr.  - Continue heparin gtt.  - Would plan [place] with CABG.  - ECG today.  2. CAD: Nonobstructive mild CAD on remote cath in 2002. He has had chest pain but is pleuritic.  HS-TnI 64 => 92, suspect demand ischemia with volume overload and afib/RVR.   Cath this admission with severe 3 vessel disease, ideally treated by CABG given extent of disease.  If no CABG, could approach with multivessel PCI but would be complex and need Impella support.  - Continue atorvastatin, ASA.  - Dr. [name] consulting, current plan for high-risk CABG early next week, likely supported by Impella 5.5.  - Unable to do cardiac MRI for viability given old ICD leads.  However, no thinning of the myocardium noted.  3. Acute on chronic systolic CHF: Initially nonischemic cardiomyopathy. Possible familial cardiomyopathy as both parents had cardiomyopathy and died at around 44.However, [employer] gene testing did not show any common mutation for cardiomyopathy.  However, this admission noted to have severe 3 vessel disease so suspect component of ischemic cardiomyopathy. [employer] ICD. Echo in 8/20 with EF 15% and mildly  decreased RV function. Echo this admission with EF < 20%, severe LV dilation, RV mildly reduced, severe LAE, no significant MR.Low output HF with markedly low EF.  He has a long history of cardiomyopathy (20 yrs), tends to minimize symptoms.  Cardiorenal syndrome with creatinine up to 2.2, now stabilized on milrinone.  This morning, Co-ox 74% with CVP 10.  Good diuresis yesterday.   - Continue milrinone 0.25.  - Decrease Lasix to 10 mg/hr, will give 2 doses of acetazolamide 250 mg bid today with elevated HCO3.   - Aggressive K replacement, recheck K in afternoon.  - Continue dig 0.125 mg daily.   - Increase spironolactone to 25 mg daily.  - Continue dapagliflozin.  - Coreg held with low output.   - Continue Unna boots.  - As above, plan high risk CABG with severe 3VD.  In future, he would be a candidate for advanced therapies.   4. OSA: OSA on sleep study, has not been using CPAP.  - Start CPAP here.  5. AKI on CKD stage ?3: Last creatinine prior to admission was 1.38.  Creatinine peaked 2.2 this admission.  Suspect cardiorenal syndrome. Creatinine lower today at 1.77.  6. Diabetes: hgbA1c 10.6.  - Insulin sliding scale.  - Consulted diabetes coordinator.  - Followed by Dr Loanne Drilling.  7. Hyponatremia: Hypervolemic hyponatremia: Fluid restrict.  8. Torsades/ICD shock: 12/7/212 in hospital event, in setting of severe hypokalemia and hypomagnesemia, K 2.8, Mg 1.7.  - Replace K and Mg.  - Continue IV amio  9. Gout: h/o gout. Complains of rt knee pain c/w previous flares - treat empirically w/ prednisone burst.  - Continue colchicine 0.6 (takes with relief at home).   CRITICAL CARE Performed by: Loralie Champagne  Total critical care time: 35 minutes  Critical care time was exclusive  of separately billable procedures and treating other patients.  Critical care was necessary to treat or prevent imminent or life-threatening deterioration.  Critical care was time spent personally by me on the  following activities: development of treatment plan with patient and/or surrogate as well as nursing, discussions with consultants, evaluation of patient's response to treatment, examination of patient, obtaining history from patient or surrogate, ordering and performing treatments and interventions, ordering and review of laboratory studies, ordering and review of radiographic studies, pulse oximetry and re-evaluation of patient's condition.  Loralie Champagne 01/06/2020 8:02 AM

## 2020-01-06 NOTE — Progress Notes (Signed)
CARDIAC REHAB PHASE I   PRE:  Rate/Rhythm: 116-120 afib  BP:  Supine:   Sitting: 135/89  Standing:    SaO2: 93%RA  MODE:  Ambulation: 370 ft   POST:  Rate/Rhythm: 123 afib  BP:  Supine:   Sitting: 129/90  Standing:    SaO2: 93%RA 1400-1433 Pt walked 370 ft on RA with steady gait pushing IV pole. No c/o CP or SOB. Tolerated well. To recliner after walk. IS in room. Gave IS to pt and he was able to reach 1250 ml correctly. Discussed the importance of walking and IS after surgery for recovery. Discussed sternal precautions and staying in the tube. Pt stated he has had sternotomy before. Wife stated she will be available after discharge to assist in care. Understanding voiced of ed.   Graylon Good, RN BSN  01/06/2020 2:26 PM

## 2020-01-06 NOTE — Progress Notes (Signed)
Pt stated that he uses bipap at night. Per notes from sleep study pt settings are 19/15 cmh2O. Pt also stated that he is claustrophobic and prefers a nasal mask.

## 2020-01-07 ENCOUNTER — Inpatient Hospital Stay (HOSPITAL_COMMUNITY): Payer: BC Managed Care – PPO

## 2020-01-07 ENCOUNTER — Other Ambulatory Visit (HOSPITAL_COMMUNITY): Payer: BC Managed Care – PPO

## 2020-01-07 DIAGNOSIS — I5021 Acute systolic (congestive) heart failure: Secondary | ICD-10-CM

## 2020-01-07 LAB — BASIC METABOLIC PANEL
Anion gap: 15 (ref 5–15)
BUN: 47 mg/dL — ABNORMAL HIGH (ref 6–20)
CO2: 35 mmol/L — ABNORMAL HIGH (ref 22–32)
Calcium: 8.8 mg/dL — ABNORMAL LOW (ref 8.9–10.3)
Chloride: 82 mmol/L — ABNORMAL LOW (ref 98–111)
Creatinine, Ser: 2.06 mg/dL — ABNORMAL HIGH (ref 0.61–1.24)
GFR, Estimated: 38 mL/min — ABNORMAL LOW (ref >60)
Glucose, Bld: 229 mg/dL — ABNORMAL HIGH (ref 70–99)
Potassium: 3.8 mmol/L (ref 3.5–5.1)
Sodium: 132 mmol/L — ABNORMAL LOW (ref 135–145)

## 2020-01-07 LAB — ECHOCARDIOGRAM COMPLETE
AR max vel: 3.15 cm2
AV Area VTI: 3.17 cm2
AV Area mean vel: 2.71 cm2
AV Mean grad: 4 mmHg
AV Peak grad: 5.6 mmHg
Ao pk vel: 1.18 m/s
Height: 75 in
MV M vel: 4.62 m/s
MV Peak grad: 85.4 mmHg
S' Lateral: 6 cm
Weight: 3612.02 oz

## 2020-01-07 LAB — CBC
HCT: 47 % (ref 39.0–52.0)
Hemoglobin: 15.2 g/dL (ref 13.0–17.0)
MCH: 26.1 pg (ref 26.0–34.0)
MCHC: 32.3 g/dL (ref 30.0–36.0)
MCV: 80.8 fL (ref 80.0–100.0)
Platelets: 275 10*3/uL (ref 150–400)
RBC: 5.82 MIL/uL — ABNORMAL HIGH (ref 4.22–5.81)
RDW: 15 % (ref 11.5–15.5)
WBC: 8.8 10*3/uL (ref 4.0–10.5)
nRBC: 0 % (ref 0.0–0.2)

## 2020-01-07 LAB — PULMONARY FUNCTION TEST
FEF 25-75 Pre: 2.35 L/sec
FEF2575-%Pred-Pre: 60 %
FEV1-%Pred-Pre: 50 %
FEV1-Pre: 2.29 L
FEV1FVC-%Pred-Pre: 105 %
FEV6-%Pred-Pre: 49 %
FEV6-Pre: 2.79 L
FEV6FVC-%Pred-Pre: 103 %
FVC-%Pred-Pre: 47 %
FVC-Pre: 2.79 L
Pre FEV1/FVC ratio: 82 %
Pre FEV6/FVC Ratio: 100 %

## 2020-01-07 LAB — GLUCOSE, CAPILLARY
Glucose-Capillary: 203 mg/dL — ABNORMAL HIGH (ref 70–99)
Glucose-Capillary: 217 mg/dL — ABNORMAL HIGH (ref 70–99)
Glucose-Capillary: 297 mg/dL — ABNORMAL HIGH (ref 70–99)
Glucose-Capillary: 278 mg/dL — ABNORMAL HIGH (ref 70–99)
Glucose-Capillary: 338 mg/dL — ABNORMAL HIGH (ref 70–99)

## 2020-01-07 LAB — MAGNESIUM: Magnesium: 2.1 mg/dL (ref 1.7–2.4)

## 2020-01-07 LAB — COOXEMETRY PANEL
Carboxyhemoglobin: 1.5 % (ref 0.5–1.5)
Methemoglobin: 0.8 % (ref 0.0–1.5)
O2 Saturation: 78.8 %
Total hemoglobin: 15.6 g/dL (ref 12.0–16.0)

## 2020-01-07 LAB — APTT: aPTT: 67 seconds — ABNORMAL HIGH (ref 24–36)

## 2020-01-07 LAB — HEPARIN LEVEL (UNFRACTIONATED): Heparin Unfractionated: 0.35 IU/mL (ref 0.30–0.70)

## 2020-01-07 MED ORDER — POTASSIUM CHLORIDE CRYS ER 20 MEQ PO TBCR
40.0000 meq | EXTENDED_RELEASE_TABLET | Freq: Every day | ORAL | Status: DC
  Administered 2020-01-07: 40 meq via ORAL
  Filled 2020-01-07 (×2): qty 2

## 2020-01-07 MED ORDER — PERFLUTREN LIPID MICROSPHERE
1.0000 mL | INTRAVENOUS | Status: AC | PRN
  Administered 2020-01-07: 5 mL via INTRAVENOUS
  Filled 2020-01-07: qty 10

## 2020-01-07 NOTE — Progress Notes (Signed)
Pt refused BIPAP for tonight. RN will contact RT if pt decides he wants to wear it at some point during the night.

## 2020-01-07 NOTE — Progress Notes (Signed)
Patient ID: Corey Palmer, male   DOB: 12-Apr-1970, 49 y.o.   MRN: [id number]     Advanced Heart Failure Rounding Note  PCP-Cardiologist: No primary care provider on file.   Subjective:    12/7 Torsades -ICD shock x1.   Good diuresis yesterday, I/O net negative -2166.  Co-ox 72%, CVP 2. Creatinine up some to 2.06.  He walked 4 times yesterday.   He is back on milrinone 0.25.  He is in NSR on amiodarone this morning.   RHC/LHC: Coronary Findings   Diagnostic Dominance: Right  Left Main  30% distal left main stenosis.  Left Anterior Descending  40% ostial LAD stenosis. 90% stenosis proximal LAD just distal to D1 take-off. Large D1 with 99% proximal stenosis, 60% mid vessel stenosis.  Left Circumflex  Long up to 70% proximal LCx stenosis.  Right Coronary Artery  95% mid RCA stenosis. 75% distal RCA stenosis. 60% stenosis proximal PLV. 99% stenosis mid PDA.   Intervention   No interventions have been documented.  Right Heart  Right Heart Pressures RHC Procedural Findings: Hemodynamics (mmHg) RA mean 16 RV 53/12 PA 65/29, mean 46 PCWP mean 31 LV 119/28 AO 118/87  Oxygen saturations: PA 45% AO 93%  Cardiac Output (Fick) 3.39  Cardiac Index (Fick) 1.4 PVR 4.4 WU PAPi 2.25     Objective:   Weight Range: 102.4 kg Body mass index is 28.22 kg/m.   Vital Signs:   Temp:  [97.6 F (36.4 C)-98.4 F (36.9 C)] 97.6 F (36.4 C) (12/10 0355) Pulse Rate:  [41-125] 84 (12/10 0700) Resp:  [11-26] 15 (12/10 0700) BP: (111-144)/(87-128) 123/89 (12/10 0700) SpO2:  [87 %-100 %] 94 % (12/10 0700) Weight:  [102.4 kg] 102.4 kg (12/10 0500) Last BM Date: 01/06/20  Weight change: Filed Weights   01/26/2020 0500 01/06/20 0500 01/07/20 0500  Weight: 107.8 kg 103.9 kg 102.4 kg    Intake/Output:   Intake/Output Summary (Last 24 hours) at 01/07/2020 0754 Last data filed at 01/07/2020 0702 Gross per 24 hour  Intake 2416.53 ml  Output 4220 ml  Net -1803.47 ml       Physical Exam  CVP 2 General: NAD Neck: No JVD, no thyromegaly or thyroid nodule.  Lungs: Clear to auscultation bilaterally with normal respiratory effort. CV: Nondisplaced PMI.  Heart regular S1/S2, no S3/S4, no murmur.  No peripheral edema.   Abdomen: Soft, nontender, no hepatosplenomegaly, no distention.  Skin: Intact without lesions or rashes.  Neurologic: Alert and oriented x 3.  Psych: Normal affect. Extremities: No clubbing or cyanosis.  HEENT: Normal.    Telemetry    NSR 80s, personally reviewed    Labs    CBC Recent Labs    01/06/20 0424 01/07/20 0319  WBC 7.4 8.8  HGB 14.6 15.2  HCT 46.5 47.0  MCV 83.6 80.8  PLT 225 174   Basic Metabolic Panel Recent Labs    01/06/20 0424 01/06/20 1657 01/07/20 0319  NA 132* 130* 132*  K 3.0* 3.9 3.8  CL 80* 80* 82*  CO2 35* 35* 35*  GLUCOSE 312* 338* 229*  BUN 40* 42* 47*  CREATININE 1.77* 2.03* 2.06*  CALCIUM 8.7* 9.0 8.8*  MG 1.9  --  2.1   Liver Function Tests No results for input(s): AST, ALT, ALKPHOS, BILITOT, PROT, ALBUMIN in the last 72 hours. No results for input(s): LIPASE, AMYLASE in the last 72 hours. Cardiac Enzymes No results for input(s): CKTOTAL, CKMB, CKMBINDEX, TROPONINI in the last 72 hours.  BNP: BNP (  last 3 results) Recent Labs    12/31/2019 1619  BNP 577.5*    ProBNP (last 3 results) No results for input(s): PROBNP in the last 8760 hours.   D-Dimer No results for input(s): DDIMER in the last 72 hours. Hemoglobin A1C No results for input(s): HGBA1C in the last 72 hours. Fasting Lipid Panel No results for input(s): CHOL, HDL, LDLCALC, TRIG, CHOLHDL, LDLDIRECT in the last 72 hours. Thyroid Function Tests No results for input(s): TSH, T4TOTAL, T3FREE, THYROIDAB in the last 72 hours.  Invalid input(s): FREET3  Other results:   Imaging    No results found.   Medications:     Scheduled Medications: . atorvastatin  80 mg Oral Daily  . busPIRone  10 mg Oral TID   . Chlorhexidine Gluconate Cloth  6 each Topical Daily  . colchicine  0.6 mg Oral Daily  . dapagliflozin propanediol  10 mg Oral Daily  . dicyclomine  10 mg Oral TID AC  . digoxin  0.125 mg Oral Daily  . divalproex  500 mg Oral QHS  . FLUoxetine  40 mg Oral BID  . insulin aspart  0-15 Units Subcutaneous TID WC  . insulin aspart  0-5 Units Subcutaneous QHS  . insulin aspart  3 Units Subcutaneous TID AC  . insulin glargine  10 Units Subcutaneous Daily  . magnesium oxide  200 mg Oral Daily  . potassium chloride  40 mEq Oral BID  . predniSONE  40 mg Oral Q breakfast  . sodium chloride flush  10-40 mL Intracatheter Q12H  . sodium chloride flush  3 mL Intravenous Q12H  . spironolactone  25 mg Oral Daily    Infusions: . sodium chloride Stopped (01/06/20 1204)  . amiodarone 30 mg/hr (01/07/20 0702)  . heparin 1,600 Units/hr (01/07/20 9211)  . milrinone 0.25 mcg/kg/min (01/07/20 0702)    PRN Medications: sodium chloride, acetaminophen, ondansetron (ZOFRAN) IV, sodium chloride flush, traMADol     Assessment/Plan   1. Atrial fibrillation: H/o PAF.  He was on dofetilide in the remote past but this was stopped due to noncompliance. He had an upper GI bleed from antral ulcers in 3/12. He was seen by GI and was deemed safe to restart anticoagulation as long as he remains on a PPI.  No apparent recurrence of AF until just prior to this admission, was cardioverted back to NSR in ER but went back into atrial fibrillation.  Now he has returned to NSR.  - Continue amiodarone gtt 30 mg/hr.  - Continue heparin gtt.  - If CABG done, would plan [place].  2. CAD: Nonobstructive mild CAD on remote cath in 2002. He has had chest pain but is pleuritic.  HS-TnI 64 => 92, suspect demand ischemia with volume overload and afib/RVR.   Cath this admission with severe 3 vessel disease, ideally treated by CABG given extent of disease.  If no CABG, could approach with multivessel PCI but would be complex and need  Impella support.  - Continue atorvastatin, ASA.  - Dr. [name] consulting, considering high-risk CABG early next week, likely supported by Impella 5.5.  - Unable to do cardiac MRI for viability given old ICD leads.  However, no thinning of the myocardium noted.  3. Acute on chronic systolic CHF: Initially nonischemic cardiomyopathy. Possible familial cardiomyopathy as both parents had cardiomyopathy and died at around 42.However, [employer] gene testing did not show any common mutation for cardiomyopathy.  However, this admission noted to have severe 3 vessel disease so suspect component of ischemic  cardiomyopathy. [employer] ICD. Echo in 8/20 with EF 15% and mildly decreased RV function. Echo this admission with EF < 20%, severe LV dilation, RV mildly reduced, severe LAE, no significant MR.Low output HF with markedly low EF.  He has a long history of cardiomyopathy (20 yrs), tends to minimize symptoms.  Cardiorenal syndrome with creatinine up to 2.2, now stabilized on milrinone.  This morning, Co-ox 72% with CVP down to 2, rise in creatinine to 2 with low CVP.  Good diuresis yesterday.   - Continue milrinone 0.25.  - Stop Lasix today with CVP 2 and rise in creatinine.   - Continue dig 0.125 mg daily.   - Continue spironolactone 25 mg daily.  - Continue dapagliflozin.  - Coreg held with low output.   - Continue Unna boots.  - As above, considering high risk CABG next week with severe 3VD.  Alternatively, may need to consider directly to LVAD if thought too high risk.  He will need repeat echo today to reassess LV and RV.  4. OSA: OSA on sleep study, has not been using CPAP.  - Start CPAP here.  5. AKI on CKD stage ?3: Last creatinine prior to admission was 1.38.  Creatinine peaked 2.2 this admission.  Suspect cardiorenal syndrome. Creatinine higher today to 2 with lower CVP.  - Holding Lasix.   6. Diabetes: hgbA1c 10.6.  - Insulin sliding scale.  - Consulted diabetes coordinator.  - Followed by Dr  Loanne Drilling.  7. Hyponatremia: Hypervolemic hyponatremia: Fluid restrict.  8. Torsades/ICD shock: 12/7/212 in hospital event, in setting of severe hypokalemia and hypomagnesemia, K 2.8, Mg 1.7.  - Follow K and Mg closely.  - Continue IV amio  9. Gout: h/o gout. Complains of rt knee pain c/w previous flares - treat empirically w/ prednisone burst.  - Continue colchicine 0.6 (takes with relief at home).   CRITICAL CARE Performed by: Loralie Champagne  Total critical care time: 35 minutes  Critical care time was exclusive of separately billable procedures and treating other patients.  Critical care was necessary to treat or prevent imminent or life-threatening deterioration.  Critical care was time spent personally by me on the following activities: development of treatment plan with patient and/or surrogate as well as nursing, discussions with consultants, evaluation of patient's response to treatment, examination of patient, obtaining history from patient or surrogate, ordering and performing treatments and interventions, ordering and review of laboratory studies, ordering and review of radiographic studies, pulse oximetry and re-evaluation of patient's condition.  Loralie Champagne 01/07/2020 7:54 AM

## 2020-01-07 NOTE — Progress Notes (Signed)
ANTICOAGULATION CONSULT NOTE - Follow Up Consult  Pharmacy Consult for heparin  Indication: atrial fibrillation  Allergies  Allergen Reactions  . Orange Fruit Anaphylaxis, Hives and Other (See Comments)    Per Pt- Blisters around lips and Hives all over, also  . Penicillins Anaphylaxis    Did it involve swelling of the face/tongue/throat, SOB, or low BP? Yes Did it involve sudden or severe rash/hives, skin peeling, or any reaction on the inside of your mouth or nose? Yes Did you need to seek medical attention at a hospital or doctor's office? No When did it last happen?childhood If all above answers are "NO", may proceed with cephalosporin use.  [name] [Empagliflozin] Itching  . [name] [Insulin Glargine] Nausea And Vomiting    Patient Measurements: Height: 6\' 3"  (190.5 cm) Weight: 102.4 kg (225 lb 12 oz) IBW/kg (Calculated) : 84.5 Heparin Dosing Weight: 110kg   Vital Signs: Temp: 97.6 F (36.4 C) (12/10 1140) Temp Source: Axillary (12/10 1140) BP: 131/94 (12/10 1400) Pulse Rate: 90 (12/10 1400)  Labs: Recent Labs    12/29/2019 0411 01/18/2020 1358 01/01/2020 1404 01/06/20 0424 01/06/20 0607 01/06/20 1339 01/06/20 1657 01/06/20 2215 01/07/20 0319 01/07/20 0321  HGB 14.6   < > 17.7* 14.6  --   --   --   --  15.2  --   HCT 45.7   < > 52.0 46.5  --   --   --   --  47.0  --   PLT 246  --   --  225  --   --   --   --  275  --   APTT 72*  --   --  45*  --   --   --   --  67*  --   HEPARINUNFRC 0.53  --   --   --    < > 0.25*  --  0.34  --  0.35  CREATININE 1.83*  --   --  1.77*  --   --  2.03*  --  2.06*  --    < > = values in this interval not displayed.    Estimated Creatinine Clearance: 55 mL/min (A) (by C-G formula based on SCr of 2.06 mg/dL (H)).   Medical History: Past Medical History:  Diagnosis Date  . Anginal pain ([place])   . Anxiety   . Arthritis   . Automatic implantable cardioverter-defibrillator in situ 2007   [employer]   . Bipolar  disorder ([place])   . CAD (coronary artery disease)    Cardiac cath (08/19/2000) - Nonobstructive, 40% stenosis of LAD,.   . Cancer [facility]) 2013   benign stomach cancer  . CHF (congestive heart failure) ([place])   . Chondrocalcinosis of right knee 06/11/2013  . Chronic systolic heart failure ([place])    2D echo (33/0076) - Systolic function severely reduced., LV EF 20-25%. Akinesis of apical and anteroseptal mycoardium.  last 2D echo (11/2008 ), LV EF 25-30%, diffuse hypokinesis, and grade 1 diastolic dysfunction.  . Depression   . Diabetes uncomplicated adult-type II    on insulin therapy, a1c 12.9 in 01/2011  . Dilated cardiomyopathy ([place])    status post ICD placement in 2008 c/b tearing at the atrial junction resulting in tamponade and urgent thoracotomy  . Dyslipidemia   . Dysrhythmia   . GERD (gastroesophageal reflux disease)   . Gout, unspecified   . Hepatic steatosis 2011   seen on ultrasound.   . History of kidney stones   .  Hx of echocardiogram    Echo (04/2013):  Mild LVH, EF 20-25%, mild LAE, mild to mod RVE with mild reduced RVSF.  Marland Kitchen Hypertension   . Obesity (BMI 30.0-34.9)   . Pacemaker   . Paroxysmal atrial fibrillation ([facility])    on chronic coumadin, goal INR 2-3. status post direct current  . Peptic ulcer   . Shortness of breath   . Sleep apnea    does not have CPAP    Assessment: 48yom with atrial fibrillation on apixaban prior to admission.  Admit for HF exacerbation and s/p cath > apixaban held for cath  Last apixaban 12/6 at 8 am, will use aptt to dose heparin as apixaban interferes with the heparin levels.   Heparin drip 1600 uts/hr heparin level at goal range today. APixaban influence has washed out - use HL moving forward for heparin dosing CBC stable plan CABG next week   Goal of Therapy:  aPTT 66-103 seconds seconds Monitor platelets by anticoagulation protocol: Yes   Plan:  Continue  IV heparin 1600 units/hr. Daily heparin level and CBC Monitor s/s bleeding     Bonnita Nasuti Pharm.D. CPP, BCPS Clinical Pharmacist [phone] 01/07/2020 3:13 PM     [facility] pharmacy phone numbers are listed on [email or web address]

## 2020-01-07 NOTE — Progress Notes (Signed)
  Echocardiogram 2D Echocardiogram has been performed with Definity.  Corey Palmer 01/07/2020, 4:39 PM

## 2020-01-07 NOTE — Progress Notes (Signed)
CARDIAC REHAB PHASE I   Pt seen ambulating multiple laps in hallway with RN. Denies any significant complaints. Will continue to follow.  Rufina Falco, RN BSN 01/07/2020 2:39 PM

## 2020-01-07 NOTE — Progress Notes (Signed)
Results for PAYNE, GARSKE (MRN [id number]) as of 01/07/2020 10:19  Ref. Range 01/06/2020 12:11 01/06/2020 15:52 01/06/2020 21:38 01/07/2020 06:12  Glucose-Capillary Latest Ref Range: 70 - 99 mg/dL 321 (H) 312 (H) 203 (H) 217 (H)  Noted that blood sugars continue to be greater than 180 mg/dl.   Recommend increasing Lantus to 15 units daily if blood sugars continue to be elevated.   Harvel Ricks RN BSN CDE Diabetes Coordinator Pager: [phone]  8am-5pm

## 2020-01-08 ENCOUNTER — Encounter (HOSPITAL_COMMUNITY): Admission: EM | Disposition: E | Payer: Self-pay | Source: Home / Self Care | Attending: Cardiothoracic Surgery

## 2020-01-08 ENCOUNTER — Inpatient Hospital Stay (HOSPITAL_COMMUNITY): Payer: BC Managed Care – PPO

## 2020-01-08 ENCOUNTER — Inpatient Hospital Stay (HOSPITAL_COMMUNITY): Payer: BC Managed Care – PPO | Admitting: Certified Registered Nurse Anesthetist

## 2020-01-08 DIAGNOSIS — I2510 Atherosclerotic heart disease of native coronary artery without angina pectoris: Secondary | ICD-10-CM

## 2020-01-08 DIAGNOSIS — I5022 Chronic systolic (congestive) heart failure: Secondary | ICD-10-CM

## 2020-01-08 DIAGNOSIS — Z01810 Encounter for preprocedural cardiovascular examination: Secondary | ICD-10-CM

## 2020-01-08 DIAGNOSIS — J9601 Acute respiratory failure with hypoxia: Secondary | ICD-10-CM

## 2020-01-08 HISTORY — PX: PLACEMENT OF IMPELLA LEFT VENTRICULAR ASSIST DEVICE: SHX6519

## 2020-01-08 LAB — TYPE AND SCREEN
ABO/RH(D): A POS
Antibody Screen: NEGATIVE

## 2020-01-08 LAB — POCT I-STAT, CHEM 8
BUN: 52 mg/dL — ABNORMAL HIGH (ref 6–20)
BUN: 51 mg/dL — ABNORMAL HIGH (ref 6–20)
Calcium, Ion: 1.25 mmol/L (ref 1.15–1.40)
Calcium, Ion: 1.24 mmol/L (ref 1.15–1.40)
Chloride: 95 mmol/L — ABNORMAL LOW (ref 98–111)
Chloride: 94 mmol/L — ABNORMAL LOW (ref 98–111)
Creatinine, Ser: 1.6 mg/dL — ABNORMAL HIGH (ref 0.61–1.24)
Creatinine, Ser: 1.6 mg/dL — ABNORMAL HIGH (ref 0.61–1.24)
Glucose, Bld: 176 mg/dL — ABNORMAL HIGH (ref 70–99)
Glucose, Bld: 183 mg/dL — ABNORMAL HIGH (ref 70–99)
HCT: 51 % (ref 39.0–52.0)
HCT: 49 % (ref 39.0–52.0)
Hemoglobin: 17.3 g/dL — ABNORMAL HIGH (ref 13.0–17.0)
Hemoglobin: 16.7 g/dL (ref 13.0–17.0)
Potassium: 3.8 mmol/L (ref 3.5–5.1)
Potassium: 3.9 mmol/L (ref 3.5–5.1)
Sodium: 135 mmol/L (ref 135–145)
Sodium: 135 mmol/L (ref 135–145)
TCO2: 32 mmol/L (ref 22–32)
TCO2: 28 mmol/L (ref 22–32)

## 2020-01-08 LAB — COOXEMETRY PANEL
Carboxyhemoglobin: 1.3 % (ref 0.5–1.5)
Carboxyhemoglobin: 1.1 % (ref 0.5–1.5)
Methemoglobin: 0.6 % (ref 0.0–1.5)
Methemoglobin: 0.8 % (ref 0.0–1.5)
O2 Saturation: 76.1 %
O2 Saturation: 63.4 %
Total hemoglobin: 16.3 g/dL — ABNORMAL HIGH (ref 12.0–16.0)
Total hemoglobin: 15.9 g/dL (ref 12.0–16.0)

## 2020-01-08 LAB — POCT I-STAT 7, (LYTES, BLD GAS, ICA,H+H)
Acid-Base Excess: 5 mmol/L — ABNORMAL HIGH (ref 0.0–2.0)
Acid-Base Excess: 7 mmol/L — ABNORMAL HIGH (ref 0.0–2.0)
Bicarbonate: 32.9 mmol/L — ABNORMAL HIGH (ref 20.0–28.0)
Bicarbonate: 30 mmol/L — ABNORMAL HIGH (ref 20.0–28.0)
Calcium, Ion: 1.24 mmol/L (ref 1.15–1.40)
Calcium, Ion: 1.23 mmol/L (ref 1.15–1.40)
HCT: 52 % (ref 39.0–52.0)
HCT: 48 % (ref 39.0–52.0)
Hemoglobin: 17.7 g/dL — ABNORMAL HIGH (ref 13.0–17.0)
Hemoglobin: 16.3 g/dL (ref 13.0–17.0)
O2 Saturation: 100 %
O2 Saturation: 97 %
Patient temperature: 35.9
Potassium: 3.8 mmol/L (ref 3.5–5.1)
Potassium: 3.9 mmol/L (ref 3.5–5.1)
Sodium: 135 mmol/L (ref 135–145)
Sodium: 133 mmol/L — ABNORMAL LOW (ref 135–145)
TCO2: 35 mmol/L — ABNORMAL HIGH (ref 22–32)
TCO2: 31 mmol/L (ref 22–32)
pCO2 arterial: 58.1 mmHg — ABNORMAL HIGH (ref 32.0–48.0)
pCO2 arterial: 35.2 mmHg (ref 32.0–48.0)
pH, Arterial: 7.362 (ref 7.350–7.450)
pH, Arterial: 7.535 — ABNORMAL HIGH (ref 7.350–7.450)
pO2, Arterial: 397 mmHg — ABNORMAL HIGH (ref 83.0–108.0)
pO2, Arterial: 76 mmHg — ABNORMAL LOW (ref 83.0–108.0)

## 2020-01-08 LAB — CBC
HCT: 49.2 % (ref 39.0–52.0)
Hemoglobin: 15.8 g/dL (ref 13.0–17.0)
MCH: 25.9 pg — ABNORMAL LOW (ref 26.0–34.0)
MCHC: 32.1 g/dL (ref 30.0–36.0)
MCV: 80.8 fL (ref 80.0–100.0)
Platelets: 354 10*3/uL (ref 150–400)
RBC: 6.09 MIL/uL — ABNORMAL HIGH (ref 4.22–5.81)
RDW: 15 % (ref 11.5–15.5)
WBC: 10.3 10*3/uL (ref 4.0–10.5)
nRBC: 0 % (ref 0.0–0.2)

## 2020-01-08 LAB — BASIC METABOLIC PANEL
Anion gap: 11 (ref 5–15)
BUN: 54 mg/dL — ABNORMAL HIGH (ref 6–20)
CO2: 32 mmol/L (ref 22–32)
Calcium: 9.3 mg/dL (ref 8.9–10.3)
Chloride: 90 mmol/L — ABNORMAL LOW (ref 98–111)
Creatinine, Ser: 2.02 mg/dL — ABNORMAL HIGH (ref 0.61–1.24)
GFR, Estimated: 39 mL/min — ABNORMAL LOW (ref >60)
Glucose, Bld: 207 mg/dL — ABNORMAL HIGH (ref 70–99)
Potassium: 4.1 mmol/L (ref 3.5–5.1)
Sodium: 133 mmol/L — ABNORMAL LOW (ref 135–145)

## 2020-01-08 LAB — HEPARIN LEVEL (UNFRACTIONATED): Heparin Unfractionated: 0.45 IU/mL (ref 0.30–0.70)

## 2020-01-08 LAB — GLUCOSE, CAPILLARY
Glucose-Capillary: 191 mg/dL — ABNORMAL HIGH (ref 70–99)
Glucose-Capillary: 219 mg/dL — ABNORMAL HIGH (ref 70–99)
Glucose-Capillary: 226 mg/dL — ABNORMAL HIGH (ref 70–99)
Glucose-Capillary: 198 mg/dL — ABNORMAL HIGH (ref 70–99)

## 2020-01-08 LAB — SURGICAL PCR SCREEN
MRSA, PCR: NEGATIVE
Staphylococcus aureus: NEGATIVE

## 2020-01-08 LAB — MAGNESIUM: Magnesium: 2.2 mg/dL (ref 1.7–2.4)

## 2020-01-08 LAB — DIGOXIN LEVEL: Digoxin Level: 0.6 ng/mL — ABNORMAL LOW (ref 0.8–2.0)

## 2020-01-08 SURGERY — INSERTION, CARDIAC ASSIST DEVICE, IMPELLA
Anesthesia: General | Site: Chest | Laterality: Right

## 2020-01-08 MED ORDER — ETOMIDATE 2 MG/ML IV SOLN
INTRAVENOUS | Status: DC | PRN
  Administered 2020-01-08: 10 mg via INTRAVENOUS

## 2020-01-08 MED ORDER — 0.9 % SODIUM CHLORIDE (POUR BTL) OPTIME
TOPICAL | Status: DC | PRN
Start: 2020-01-08 — End: 2020-01-08
  Administered 2020-01-08: 12:00:00 2000 mL

## 2020-01-08 MED ORDER — SODIUM CHLORIDE 0.9 % IV SOLN
INTRAVENOUS | Status: DC | PRN
  Administered 2020-01-08: 11:00:00 500 mL

## 2020-01-08 MED ORDER — HEPARIN SODIUM (PORCINE) 5000 UNIT/ML IJ SOLN
INTRAVENOUS | Status: DC
  Filled 2020-01-08: qty 10

## 2020-01-08 MED ORDER — DEXMEDETOMIDINE HCL IN NACL 200 MCG/50ML IV SOLN
INTRAVENOUS | Status: AC
  Filled 2020-01-08: qty 50

## 2020-01-08 MED ORDER — MIDAZOLAM HCL 2 MG/2ML IJ SOLN
INTRAMUSCULAR | Status: AC
  Administered 2020-01-08: 17:00:00 2 mg
  Filled 2020-01-08: qty 2

## 2020-01-08 MED ORDER — FENTANYL CITRATE (PF) 250 MCG/5ML IJ SOLN
INTRAMUSCULAR | Status: AC
  Filled 2020-01-08: qty 5

## 2020-01-08 MED ORDER — SODIUM CHLORIDE 0.9% FLUSH
10.0000 mL | INTRAVENOUS | Status: DC | PRN
  Administered 2020-01-11: 10:00:00 10 mL

## 2020-01-08 MED ORDER — SODIUM CHLORIDE 0.9% IV SOLUTION: Freq: Once | INTRAVENOUS | Status: DC

## 2020-01-08 MED ORDER — HEMOSTATIC AGENTS (NO CHARGE) OPTIME
TOPICAL | Status: DC | PRN
  Administered 2020-01-08 (×2): 1 via TOPICAL

## 2020-01-08 MED ORDER — SODIUM CHLORIDE 0.9% FLUSH
10.0000 mL | Freq: Two times a day (BID) | INTRAVENOUS | Status: DC
  Administered 2020-01-08: 23:00:00 10 mL
  Administered 2020-01-09: 11:00:00 20 mL
  Administered 2020-01-09 – 2020-01-11 (×5): 10 mL

## 2020-01-08 MED ORDER — FENTANYL CITRATE (PF) 100 MCG/2ML IJ SOLN: 100.0000 ug | Freq: Once | INTRAMUSCULAR | Status: AC

## 2020-01-08 MED ORDER — HEPARIN SODIUM (PORCINE) 1000 UNIT/ML IJ SOLN
INTRAMUSCULAR | Status: DC | PRN
  Administered 2020-01-08: 2000 [IU] via INTRAVENOUS
  Administered 2020-01-08: 5000 [IU] via INTRAVENOUS

## 2020-01-08 MED ORDER — FENTANYL CITRATE (PF) 250 MCG/5ML IJ SOLN
INTRAMUSCULAR | Status: DC | PRN
  Administered 2020-01-08 (×5): 50 ug via INTRAVENOUS

## 2020-01-08 MED ORDER — LACTATED RINGERS IV BOLUS
1000.0000 mL | Freq: Once | INTRAVENOUS | Status: AC
  Administered 2020-01-08: 21:00:00 1000 mL via INTRAVENOUS

## 2020-01-08 MED ORDER — LACTATED RINGERS IV SOLN: INTRAVENOUS | Status: DC | PRN

## 2020-01-08 MED ORDER — VANCOMYCIN HCL IN DEXTROSE 1-5 GM/200ML-% IV SOLN
INTRAVENOUS | Status: AC
  Filled 2020-01-08: qty 200

## 2020-01-08 MED ORDER — FENTANYL CITRATE (PF) 100 MCG/2ML IJ SOLN
INTRAMUSCULAR | Status: AC
  Administered 2020-01-08: 18:00:00 100 ug
  Filled 2020-01-08: qty 2

## 2020-01-08 MED ORDER — VANCOMYCIN HCL 1000 MG IV SOLR
INTRAVENOUS | Status: DC | PRN
  Administered 2020-01-08: 15:00:00 1000 mg via INTRAVENOUS

## 2020-01-08 MED ORDER — INSULIN ASPART 100 UNIT/ML ~~LOC~~ SOLN
0.0000 [IU] | SUBCUTANEOUS | Status: DC
  Administered 2020-01-08: 23:00:00 3 [IU] via SUBCUTANEOUS
  Administered 2020-01-09: 20:00:00 2 [IU] via SUBCUTANEOUS
  Administered 2020-01-09: 14:00:00 5 [IU] via SUBCUTANEOUS
  Administered 2020-01-09: 02:00:00 3 [IU] via SUBCUTANEOUS
  Administered 2020-01-09: 06:00:00 2 [IU] via SUBCUTANEOUS
  Administered 2020-01-09: 16:00:00 5 [IU] via SUBCUTANEOUS
  Administered 2020-01-10: 20:00:00 3 [IU] via SUBCUTANEOUS
  Administered 2020-01-11: 2 [IU] via SUBCUTANEOUS
  Administered 2020-01-11 (×4): 3 [IU] via SUBCUTANEOUS
  Administered 2020-01-11 – 2020-01-12 (×2): 2 [IU] via SUBCUTANEOUS
  Administered 2020-01-12: 01:00:00 3 [IU] via SUBCUTANEOUS

## 2020-01-08 MED ORDER — SODIUM CHLORIDE 0.9 % IV SOLN
INTRAVENOUS | Status: AC
  Filled 2020-01-08: qty 1.2

## 2020-01-08 MED ORDER — LACTATED RINGERS IV BOLUS
250.0000 mL | Freq: Once | INTRAVENOUS | Status: AC
  Administered 2020-01-08: 23:00:00 250 mL via INTRAVENOUS

## 2020-01-08 MED ORDER — MUPIROCIN 2 % EX OINT
TOPICAL_OINTMENT | Freq: Two times a day (BID) | CUTANEOUS | Status: DC
  Filled 2020-01-08: qty 22

## 2020-01-08 MED ORDER — VASOPRESSIN 20 UNIT/ML IV SOLN
INTRAVENOUS | Status: AC
  Filled 2020-01-08: qty 1

## 2020-01-08 MED ORDER — ONDANSETRON HCL 4 MG/2ML IJ SOLN
INTRAMUSCULAR | Status: DC | PRN
  Administered 2020-01-08: 4 mg via INTRAVENOUS

## 2020-01-08 MED ORDER — MIDAZOLAM HCL 5 MG/5ML IJ SOLN
INTRAMUSCULAR | Status: DC | PRN
  Administered 2020-01-08 (×4): 1 mg via INTRAVENOUS

## 2020-01-08 MED ORDER — EPINEPHRINE HCL 5 MG/250ML IV SOLN IN NS
0.5000 ug/min | INTRAVENOUS | Status: DC
  Administered 2020-01-09: 2 ug/min via INTRAVENOUS
  Filled 2020-01-08: qty 250

## 2020-01-08 MED ORDER — CHLORHEXIDINE GLUCONATE 0.12% ORAL RINSE (MEDLINE KIT)
15.0000 mL | Freq: Two times a day (BID) | OROMUCOSAL | Status: DC
  Administered 2020-01-08 – 2020-01-09 (×2): 15 mL via OROMUCOSAL

## 2020-01-08 MED ORDER — MIDAZOLAM HCL 2 MG/2ML IJ SOLN
INTRAMUSCULAR | Status: AC
  Filled 2020-01-08: qty 2

## 2020-01-08 MED ORDER — ROCURONIUM BROMIDE 10 MG/ML (PF) SYRINGE
PREFILLED_SYRINGE | INTRAVENOUS | Status: DC | PRN
  Administered 2020-01-08: 60 mg via INTRAVENOUS
  Administered 2020-01-08: 20 mg via INTRAVENOUS
  Administered 2020-01-08: 10 mg via INTRAVENOUS

## 2020-01-08 MED ORDER — DEXMEDETOMIDINE HCL IN NACL 200 MCG/50ML IV SOLN
INTRAVENOUS | Status: DC | PRN
  Administered 2020-01-08: .7 ug/kg/h via INTRAVENOUS

## 2020-01-08 MED ORDER — NITROGLYCERIN IN D5W 200-5 MCG/ML-% IV SOLN
0.0000 ug/min | INTRAVENOUS | Status: DC
  Administered 2020-01-08: 18:00:00 5 ug/min via INTRAVENOUS
  Administered 2020-01-09: 12:00:00 60 ug/min via INTRAVENOUS
  Filled 2020-01-08: qty 250

## 2020-01-08 MED ORDER — FENTANYL CITRATE (PF) 100 MCG/2ML IJ SOLN
50.0000 ug | INTRAMUSCULAR | Status: DC | PRN
  Administered 2020-01-09 (×2): 50 ug via INTRAVENOUS
  Filled 2020-01-08 (×2): qty 2

## 2020-01-08 MED ORDER — DEXMEDETOMIDINE HCL IN NACL 400 MCG/100ML IV SOLN
0.0000 ug/kg/h | INTRAVENOUS | Status: DC
  Administered 2020-01-08: 22:00:00 0.891 ug/kg/h via INTRAVENOUS
  Administered 2020-01-08 – 2020-01-09 (×2): 1.2 ug/kg/h via INTRAVENOUS
  Administered 2020-01-09: 02:00:00 1.1 ug/kg/h via INTRAVENOUS
  Administered 2020-01-09: 10:00:00 1.2 ug/kg/h via INTRAVENOUS
  Administered 2020-01-09: 05:00:00 1 ug/kg/h via INTRAVENOUS
  Filled 2020-01-08 (×9): qty 100

## 2020-01-08 MED ORDER — HEPARIN SODIUM (PORCINE) 5000 UNIT/ML IJ SOLN
INTRAVENOUS | Status: DC
  Administered 2020-01-08: 11:00:00 50 [IU]/h
  Filled 2020-01-08 (×2): qty 10

## 2020-01-08 MED ORDER — ORAL CARE MOUTH RINSE
15.0000 mL | OROMUCOSAL | Status: DC
  Administered 2020-01-08 – 2020-01-09 (×8): 15 mL via OROMUCOSAL

## 2020-01-08 MED ORDER — PROPOFOL 10 MG/ML IV BOLUS
INTRAVENOUS | Status: AC
  Filled 2020-01-08: qty 20

## 2020-01-08 SURGICAL SUPPLY — 63 items
ANCHOR CATH FOLEY SECURE (MISCELLANEOUS) ×6 IMPLANT
APPLICATOR TIP COSEAL (VASCULAR PRODUCTS) IMPLANT
BAG DECANTER FOR FLEXI CONT (MISCELLANEOUS) ×2 IMPLANT
BLADE CLIPPER SURG (BLADE) IMPLANT
BLADE SURG 11 STRL SS (BLADE) ×2 IMPLANT
CATH ACCU-VU SIZ PIG 5F 100CM (CATHETERS) ×2 IMPLANT
CATH DIAG EXPO 6F AL1 (CATHETERS) IMPLANT
CATH INFINITI 6F MPB2 (CATHETERS) IMPLANT
CLIP LIGATING EXTRA MED SLVR (CLIP) ×2 IMPLANT
CLIP VESOCCLUDE MED 24/CT (CLIP) ×2 IMPLANT
CLIP VESOCCLUDE SM WIDE 24/CT (CLIP) ×2 IMPLANT
DRAPE C-ARM 42X72 X-RAY (DRAPES) ×2 IMPLANT
DRAPE CV SPLIT W-CLR ANES SCRN (DRAPES) ×2 IMPLANT
DRAPE INCISE IOBAN 66X45 STRL (DRAPES) ×2 IMPLANT
DRAPE PERI GROIN 82X75IN TIB (DRAPES) ×2 IMPLANT
DRAPE SLUSH/WARMER DISC (DRAPES) ×2 IMPLANT
DRSG TEGADERM 4X4.5 CHG (GAUZE/BANDAGES/DRESSINGS) ×2 IMPLANT
ELECT BLADE 4.0 EZ CLEAN MEGAD (MISCELLANEOUS) ×2
ELECTRODE BLDE 4.0 EZ CLN MEGD (MISCELLANEOUS) ×1 IMPLANT
FELT TEFLON 1X6 (MISCELLANEOUS) IMPLANT
GAUZE SPONGE 4X4 12PLY STRL (GAUZE/BANDAGES/DRESSINGS) ×2 IMPLANT
GAUZE SPONGE 4X4 12PLY STRL LF (GAUZE/BANDAGES/DRESSINGS) ×2 IMPLANT
GLOVE BIOGEL PI IND STRL 7.5 (GLOVE) ×1 IMPLANT
GLOVE BIOGEL PI INDICATOR 7.5 (GLOVE) ×1
GLOVE NEODERM STER SZ 7 (GLOVE) ×4 IMPLANT
GLOVE NEODERM STRL 7.5  LF PF (GLOVE) ×2
GLOVE NEODERM STRL 7.5 LF PF (GLOVE) ×2 IMPLANT
GLOVE SURG NEODERM 7.5  LF PF (GLOVE) ×2
GLOVE SURG UNDER POLY LF SZ7 (GLOVE) ×4 IMPLANT
GOWN STRL REUS W/ TWL LRG LVL3 (GOWN DISPOSABLE) ×4 IMPLANT
GOWN STRL REUS W/TWL LRG LVL3 (GOWN DISPOSABLE) ×8
GRAFT HEMASHIELD 10MM (Graft) ×2 IMPLANT
GRAFT VASC STRG 30X10STRL (Graft) ×1 IMPLANT
INSERT FOGARTY SM (MISCELLANEOUS) ×2 IMPLANT
KIT BASIN OR (CUSTOM PROCEDURE TRAY) ×2 IMPLANT
LOOP VESSEL MINI RED (MISCELLANEOUS) IMPLANT
NS IRRIG 1000ML POUR BTL (IV SOLUTION) ×4 IMPLANT
PACK CHEST (CUSTOM PROCEDURE TRAY) ×2 IMPLANT
PAD ARMBOARD 7.5X6 YLW CONV (MISCELLANEOUS) ×4 IMPLANT
PAD ELECT DEFIB RADIOL ZOLL (MISCELLANEOUS) ×2 IMPLANT
POWDER SURGICEL 3.0 GRAM (HEMOSTASIS) ×2 IMPLANT
PUMP SET IMPELLA 5.5 US (CATHETERS) ×2 IMPLANT
SEALANT SURG COSEAL 8ML (VASCULAR PRODUCTS) ×2 IMPLANT
STAPLER VISISTAT 35W (STAPLE) ×2 IMPLANT
SUT ETHILON 3 0 PS 1 (SUTURE) IMPLANT
SUT PDS AB 1 CTX 36 (SUTURE) IMPLANT
SUT PROLENE 5 0 C 1 36 (SUTURE) ×6 IMPLANT
SUT SILK  1 MH (SUTURE) ×2
SUT SILK 1 MH (SUTURE) ×1 IMPLANT
SUT SILK 1 TIES 10X30 (SUTURE) ×2 IMPLANT
SUT SILK 2 0 SH CR/8 (SUTURE) ×2 IMPLANT
SUT VIC AB 2-0 CT1 27 (SUTURE) ×2
SUT VIC AB 2-0 CT1 TAPERPNT 27 (SUTURE) ×1 IMPLANT
SYR 30ML LL (SYRINGE) ×2 IMPLANT
SYR BULB IRRIG 60ML STRL (SYRINGE) ×2 IMPLANT
TAPE CLOTH SURG 4X10 WHT LF (GAUZE/BANDAGES/DRESSINGS) ×2 IMPLANT
TOWEL GREEN STERILE (TOWEL DISPOSABLE) ×2 IMPLANT
TOWEL GREEN STERILE FF (TOWEL DISPOSABLE) ×2 IMPLANT
TRAY FOLEY SLVR 16FR TEMP STAT (SET/KITS/TRAYS/PACK) ×2 IMPLANT
TUBE CONNECTING 12X1/4 (SUCTIONS) ×2 IMPLANT
WATER STERILE IRR 1000ML POUR (IV SOLUTION) ×2 IMPLANT
WIRE EMERALD 3MM-J .035X260CM (WIRE) ×2 IMPLANT
YANKAUER SUCT BULB TIP NO VENT (SUCTIONS) ×2 IMPLANT

## 2020-01-08 NOTE — Anesthesia Procedure Notes (Signed)
Procedure Name: Intubation Date/Time: 01/22/2020 2:39 PM Performed by: Wilburn Cornelia, CRNA Pre-anesthesia Checklist: Patient identified, Emergency Drugs available, Suction available, Patient being monitored and Timeout performed Patient Re-evaluated:Patient Re-evaluated prior to induction Oxygen Delivery Method: Circle system utilized Preoxygenation: Pre-oxygenation with 100% oxygen Induction Type: IV induction Ventilation: Mask ventilation without difficulty Laryngoscope Size: Mac and 4 Grade View: Grade II Tube type: Oral Tube size: 8.0 mm Number of attempts: 1 Airway Equipment and Method: Stylet Placement Confirmation: ETT inserted through vocal cords under direct vision,  positive ETCO2,  CO2 detector and breath sounds checked- equal and bilateral Secured at: 23 cm Tube secured with: Tape Dental Injury: Teeth and Oropharynx as per pre-operative assessment

## 2020-01-08 NOTE — Transfer of Care (Signed)
Immediate Anesthesia Transfer of Care Note  Patient: HAKIM MINNIEFIELD  Procedure(s) Performed: AXILLARY  ARTERY PLACEMENT OF ABIOMED IMPELLA  5.5 LEFT VENTRICULAR ASSIST DEVICE (Right Chest)  Patient Location: ICU  Anesthesia Type:General  Level of Consciousness: sedated and Patient remains intubated per anesthesia plan  Airway & Oxygen Therapy: Patient remains intubated per anesthesia plan and Patient placed on Ventilator (see vital sign flow sheet for setting)  Post-op Assessment: Report given to RN and Post -op Vital signs reviewed and stable  Post vital signs: Reviewed and stable  Last Vitals:  Vitals Value Taken Time  BP    Temp    Pulse    Resp    SpO2      Last Pain:  Vitals:   01/27/2020 0800  TempSrc:   PainSc: 0-No pain      Patients Stated Pain Goal: 0 (46/95/07 2257)  Complications: No complications documented.

## 2020-01-08 NOTE — Consult Note (Signed)
[address]       [place] [address]             [phone]        Chalmer T Neary [facility] Medical Record #[id number] Date of Birth: 10/05/1970  Referring: No ref. provider found Primary Care: Sueanne Margarita, MD Primary Cardiologist:No primary care provider on file.  Chief Complaint:   No chief complaint on file. Shortness of breath Heart Failure  History of Present Illness:     49 yo man with known CM s/p AICD presented last week with history of ICD shocks. He was found to be in afib and was cardioverted. Further exam demonstrated CHF. AHF was consulted. He underwent LHC which showed severe multivessel CAD which is new. His LV EF is approximately 15% prior to diuresis. Consult is received for consideration of surgical intervention, whether mechanical support, surg revascularization or both.   He has been active up to this point, working as a Engineer, building services.    Current Activity/ Functional Status: Patient will be independent with mobility/ambulation, transfers, ADL's, IADL's.   Zubrod Score: At the time of surgery this patient's most appropriate activity status/level should be described as: []     0    Normal activity, no symptoms []     1    Restricted in physical strenuous activity but ambulatory, able to do out light work []     2    Ambulatory and capable of self care, unable to do work activities, up and about                 more than 50%  Of the time                            []     3    Only limited self care, in bed greater than 50% of waking hours []     4    Completely disabled, no self care, confined to bed or chair []     5    Moribund  Past Medical History:  Diagnosis Date  . Anginal pain ([place])   . Anxiety   . Arthritis   . Automatic implantable cardioverter-defibrillator in situ 2007   [employer]   . Bipolar disorder ([place])   . CAD (coronary artery disease)    Cardiac cath (08/19/2000) - Nonobstructive, 40% stenosis of LAD,.   . Cancer  [facility]) 2013   benign stomach cancer  . CHF (congestive heart failure) ([place])   . Chondrocalcinosis of right knee 06/11/2013  . Chronic systolic heart failure ([place])    2D echo (82/4235) - Systolic function severely reduced., LV EF 20-25%. Akinesis of apical and anteroseptal mycoardium.  last 2D echo (11/2008 ), LV EF 25-30%, diffuse hypokinesis, and grade 1 diastolic dysfunction.  . Depression   . Diabetes uncomplicated adult-type II    on insulin therapy, a1c 12.9 in 01/2011  . Dilated cardiomyopathy ([name])    status post ICD placement in 2008 c/b tearing at the atrial junction resulting in tamponade and urgent thoracotomy  . Dyslipidemia   . Dysrhythmia   . GERD (gastroesophageal reflux disease)   . Gout, unspecified   . Hepatic steatosis 2011   seen on ultrasound.   . History of kidney stones   . Hx of echocardiogram    Echo (04/2013):  Mild LVH, EF 20-25%, mild LAE, mild to mod RVE with mild reduced RVSF.  Marland Kitchen  Hypertension   . Obesity (BMI 30.0-34.9)   . Pacemaker   . Paroxysmal atrial fibrillation (H[facility])    on chronic coumadin, goal INR 2-3. status post direct current  . Peptic ulcer   . Shortness of breath   . Sleep apnea    does not have CPAP    Past Surgical History:  Procedure Laterality Date  . BIOPSY  12/01/2019   Procedure: BIOPSY;  Surgeon: Arta Silence, MD;  Location: [facility];  Service: Endoscopy;;  . CARDIAC CATHETERIZATION    . CARDIAC PACEMAKER PLACEMENT    . CHOLECYSTECTOMY    . COLONOSCOPY    . CORONARY ARTERY BYPASS GRAFT    . ESOPHAGOGASTRODUODENOSCOPY  04/05/2011   Procedure: ESOPHAGOGASTRODUODENOSCOPY (EGD);  Surgeon: Irene Shipper, MD;  Location: [facility] ENDOSCOPY;  Service: Endoscopy;  Laterality: N/A;  . ESOPHAGOGASTRODUODENOSCOPY (EGD) WITH PROPOFOL N/A 12/01/2019   Procedure: ESOPHAGOGASTRODUODENOSCOPY (EGD) WITH PROPOFOL;  Surgeon: Arta Silence, MD;  Location: [facility];  Service: Endoscopy;  Laterality: N/A;  . JOINT REPLACEMENT     left knee  replacement  . KNEE ARTHROSCOPY Right 06/11/2013   Procedure: RIGHT KNEE ARTHROSCOPY with chondroplasty;  Surgeon: Johnny Bridge, MD;  Location: [place];  Service: Orthopedics;  Laterality: Right;  . RIGHT/LEFT HEART CATH AND CORONARY ANGIOGRAPHY N/A 01/15/2020   Procedure: RIGHT/LEFT HEART CATH AND CORONARY ANGIOGRAPHY;  Surgeon: Larey Dresser, MD;  Location: [facility];  Service: Cardiovascular;  Laterality: N/A;  . TOTAL KNEE ARTHROPLASTY Left     Social History   Tobacco Use  Smoking Status Never Smoker  Smokeless Tobacco Never Used    Social History   Substance and Sexual Activity  Alcohol Use No     Allergies  Allergen Reactions  . Orange Fruit Anaphylaxis, Hives and Other (See Comments)    Per Pt- Blisters around lips and Hives all over, also  . Penicillins Anaphylaxis    Did it involve swelling of the face/tongue/throat, SOB, or low BP? Yes Did it involve sudden or severe rash/hives, skin peeling, or any reaction on the inside of your mouth or nose? Yes Did you need to seek medical attention at a hospital or doctor's office? No When did it last happen?childhood If all above answers are "NO", may proceed with cephalosporin use.  [name] [Empagliflozin] Itching  . [name] [Insulin Glargine] Nausea And Vomiting    Current Facility-Administered Medications  Medication Dose Route Frequency Provider Last Rate Last Admin  . 0.9 %  sodium chloride infusion  250 mL Intravenous PRN Larey Dresser, MD   Stopped at 01/06/20 1204  . acetaminophen (TYLENOL) tablet 650 mg  650 mg Oral Q4H PRN Larey Dresser, MD      . amiodarone (NEXTERONE PREMIX) 360-4.14 MG/200ML-% (1.8 mg/mL) IV infusion  30 mg/hr Intravenous Continuous Larey Dresser, MD 16.67 mL/hr at 01/05/2020 0700 30 mg/hr at 12/31/2019 0700  . atorvastatin (LIPITOR) tablet 80 mg  80 mg Oral Daily Freada Bergeron, MD   80 mg at 01/12/2020 0904  . busPIRone (BUSPAR) tablet 10 mg  10 mg Oral TID  Freada Bergeron, MD   10 mg at 01/21/2020 0904  . Chlorhexidine Gluconate Cloth 2 % PADS 6 each  6 each Topical Daily Larey Dresser, MD   6 each at 01/07/20 1000  . colchicine tablet 0.6 mg  0.6 mg Oral Daily Larey Dresser, MD   0.6 mg at 01/03/2020 2248  . dapagliflozin propanediol (FARXIGA) tablet 10 mg  10 mg Oral Daily Larey Dresser, MD   10 mg at 01/25/2020 1191  . dicyclomine (BENTYL) capsule 10 mg  10 mg Oral TID AC Freada Bergeron, MD   10 mg at 01/07/2020 0904  . digoxin (LANOXIN) tablet 0.125 mg  0.125 mg Oral Daily Clegg, Amy D, NP   0.125 mg at 01/25/2020 0904  . divalproex (DEPAKOTE ER) 24 hr tablet 500 mg  500 mg Oral QHS Freada Bergeron, MD   500 mg at 01/07/20 2115  . FLUoxetine (PROZAC) capsule 40 mg  40 mg Oral BID Freada Bergeron, MD   40 mg at 12/30/2019 0904  . heparin ADULT infusion 100 units/mL (25000 units/286mL sodium chloride 0.45%)  1,600 Units/hr Intravenous Continuous Carney, Gay Filler, RPH 16 mL/hr at 01/01/2020 0700 1,600 Units/hr at 01/19/2020 0700  . insulin aspart (novoLOG) injection 0-15 Units  0-15 Units Subcutaneous TID WC Freada Bergeron, MD   3 Units at 01/23/2020 [phone]  . insulin aspart (novoLOG) injection 0-5 Units  0-5 Units Subcutaneous QHS Freada Bergeron, MD   4 Units at 01/07/20 2114  . insulin aspart (novoLOG) injection 3 Units  3 Units Subcutaneous TID AC Larey Dresser, MD   3 Units at 12/31/2019 [phone]  . insulin glargine (LANTUS) injection 10 Units  10 Units Subcutaneous Daily Clegg, Amy D, NP   10 Units at 01/24/2020 0905  . magnesium oxide (MAG-OX) tablet 200 mg  200 mg Oral Daily Barrett, Rhonda G, PA-C   200 mg at 01/12/2020 0904  . milrinone (PRIMACOR) 20 MG/100 ML (0.2 mg/mL) infusion  0.25 mcg/kg/min Intravenous Continuous Larey Dresser, MD 8.09 mL/hr at 01/26/2020 0700 0.25 mcg/kg/min at 01/17/2020 0700  . ondansetron (ZOFRAN) injection 4 mg  4 mg Intravenous Q6H PRN Larey Dresser, MD      . potassium chloride SA (KLOR-CON) CR  tablet 40 mEq  40 mEq Oral Daily Larey Dresser, MD   40 mEq at 01/07/20 1308  . sodium chloride flush (NS) 0.9 % injection 10-40 mL  10-40 mL Intracatheter Q12H Larey Dresser, MD   10 mL at 01/07/20 2117  . sodium chloride flush (NS) 0.9 % injection 10-40 mL  10-40 mL Intracatheter PRN Larey Dresser, MD      . sodium chloride flush (NS) 0.9 % injection 3 mL  3 mL Intravenous Q12H Clegg, Amy D, NP   3 mL at 01/07/20 2117  . spironolactone (ALDACTONE) tablet 25 mg  25 mg Oral Daily Larey Dresser, MD   25 mg at 01/15/2020 0904  . traMADol (ULTRAM) tablet 50 mg  50 mg Oral Q6H PRN Freada Bergeron, MD   50 mg at 01/04/20 1631    Medications Prior to Admission  Medication Sig Dispense Refill Last Dose  . apixaban (ELIQUIS) 5 MG TABS tablet Take 1 tablet (5 mg total) by mouth 2 (two) times daily. 180 tablet 3 12/31/2019 at 0600  . aspirin 81 MG chewable tablet Chew 324 mg by mouth as needed (for chest pain).   01/28/2020 at 1200  . busPIRone (BUSPAR) 10 MG tablet Take 1 tablet (10 mg total) by mouth 3 (three) times daily. 90 tablet 11 01/22/2020 at am  . carvedilol (COREG) 25 MG tablet Take 1 tablet (25 mg total) by mouth 2 (two) times daily. Needs appt (Patient taking differently: Take 25 mg by mouth 2 (two) times daily. ) 180 tablet 3 01/25/2020 at 0600  . dapagliflozin propanediol (FARXIGA) 10 MG  TABS tablet Take 10 mg by mouth daily before breakfast. 30 tablet 6 01/19/2020 at Unknown time  . dicyclomine (BENTYL) 10 MG capsule Take 10 mg by mouth 3 (three) times daily before meals.   01/24/2020 at Unknown time  . digoxin (LANOXIN) 0.125 MG tablet Take 1 tablet (0.125 mg total) by mouth daily. 90 tablet 3 01/13/2020 at Unknown time  . divalproex (DEPAKOTE ER) 500 MG 24 hr tablet Take 1 tablet (500 mg total) by mouth at bedtime. 90 tablet 3 12/28/2019 at pm  . FLUoxetine (PROZAC) 20 MG capsule Take 4 capsules (80 mg total) by mouth daily. (Patient taking differently: Take 40 mg by mouth 2 (two)  times daily. ) 120 capsule 11 12/30/2019 at am  . furosemide (LASIX) 20 MG tablet TAKE 2 TABLETS (40 MG TOTAL) BY MOUTH IN THE MORNING AND 1 TABLET (20 MG TOTAL) EVERY EVENING. (Patient taking differently: TAKE 2 TABLETS (40 MG TOTAL) BY MOUTH IN THE MORNING AND 1 TABLET (20 MG TOTAL) EVERY EVENING) 90 tablet 1 01/14/2020 at am  . hydrOXYzine (VISTARIL) 50 MG capsule Take 1 capsule (50 mg total) by mouth 3 (three) times daily as needed for anxiety. 90 capsule 11 unk  . insulin aspart (NOVOLOG FLEXPEN) 100 UNIT/ML FlexPen Inject 5-50 Units into the skin See admin instructions. Inject 5-50 units into the skin three times a day before meals, PER SLIDING SCALE   01/23/2020 at am  . magnesium oxide (MAG-OX) 400 MG tablet Take 800 mg by mouth daily.    01/23/2020 at am  . omeprazole (PRILOSEC) 40 MG capsule Take 1 capsule (40 mg total) by mouth daily. (Patient taking differently: Take 40 mg by mouth 2 (two) times daily before a meal. ) 30 capsule 3 01/12/2020 at am  . ondansetron (ZOFRAN) 4 MG tablet Take 4 mg by mouth every 8 (eight) hours as needed for nausea or vomiting.   unk  . POTASSIUM PO Take 4 tablets by mouth in the morning.   01/25/2020 at am  . promethazine (PHENERGAN) 25 MG tablet Take 25 mg by mouth every 8 (eight) hours as needed for nausea or vomiting.    unk  . sacubitril-valsartan (ENTRESTO) 49-51 MG Take 1 tablet by mouth 2 (two) times daily. 60 tablet 6 01/10/2020 at am  . Semaglutide,0.25 or 0.5MG /DOS, (OZEMPIC, 0.25 OR 0.5 MG/DOSE,) 2 MG/1.5ML SOPN Inject 0.5 mg into the skin once a week. (Patient taking differently: Inject 0.5 mg into the skin every Sunday. ) 4.5 mL 3 12/26/2019 at Unknown time  . spironolactone (ALDACTONE) 25 MG tablet Take 1 tablet (25 mg total) by mouth daily. Needs appt (Patient taking differently: Take 25 mg by mouth daily. ) 90 tablet 3 01/11/2020 at Unknown time  . glucose blood (CONTOUR TEST) test strip 1 each by Other route 2 (two) times daily. And lancets 1/day 180  each 3   . insulin glargine (LANTUS SOLOSTAR) 100 UNIT/ML Solostar Pen Inject 40 Units into the skin every morning. 15 mL PRN 10/2019 at See note    Family History  Problem Relation Age of Onset  . Diabetes Mother   . Coronary artery disease Mother 65  . Gout Mother   . Heart disease Mother   . Heart attack Mother 55  . Diabetes Father   . Coronary artery disease Father 65       Died in a house fire   . Heart disease Father   . Heart disease Brother      Review of  Systems:   ROS Pertinent items noted in HPI and remainder of comprehensive ROS otherwise negative.     Cardiac Review of Systems: Y or  [    ]= no  Chest Pain [    ]  Resting SOB [   ] Exertional SOB  [  ]  Orthopnea [  ]   Pedal Edema [   ]    Palpitations [  ] Syncope  [  ]   Presyncope [   ]  General Review of Systems: [Y] = yes [  ]=no Constitional: recent weight change [  ]; anorexia [  ]; fatigue [  ]; nausea [  ]; night sweats [  ]; fever [  ]; or chills [  ]                                                               Dental: Last Dentist visit:   Eye : blurred vision [  ]; diplopia [   ]; vision changes [  ];  Amaurosis fugax[  ]; Resp: cough [  ];  wheezing[  ];  hemoptysis[  ]; shortness of breath[  ]; paroxysmal nocturnal dyspnea[  ]; dyspnea on exertion[  ]; or orthopnea[  ];  GI:  gallstones[  ], vomiting[  ];  dysphagia[  ]; melena[  ];  hematochezia [  ]; heartburn[  ];   Hx of  Colonoscopy[  ]; GU: kidney stones [  ]; hematuria[  ];   dysuria [  ];  nocturia[  ];  history of     obstruction [  ]; urinary frequency [  ]             Skin: rash, swelling[  ];, hair loss[  ];  peripheral edema[  ];  or itching[  ]; Musculosketetal: myalgias[  ];  joint swelling[  ];  joint erythema[  ];  joint pain[  ];  back pain[  ];  Heme/Lymph: bruising[  ];  bleeding[  ];  anemia[  ];  Neuro: TIA[  ];  headaches[  ];  stroke[  ];  vertigo[  ];  seizures[  ];   paresthesias[  ];  difficulty walking[   ];  Psych:depression[  ]; anxiety[  ];  Endocrine: diabetes[  ];  thyroid dysfunction[  ];           Physical Exam: BP 125/90   Pulse 85   Temp 98.7 F (37.1 C) (Oral)   Resp 16   Ht 6\' 3"  (1.905 m)   Wt 101.9 kg   SpO2 (!) 89%   BMI 28.08 kg/m    General appearance: alert and cooperative Head: Normocephalic, without obvious abnormality, atraumatic Resp: clear to auscultation bilaterally Cardio: regular rate and rhythm, S1, S2 normal, no murmur, click, rub or gallop GI: soft, non-tender; bowel sounds normal; no masses,  no organomegaly Extremities: edema 2+ Neurologic: Alert and oriented X 3, normal strength and tone. Normal symmetric reflexes. Normal coordination and gait  Diagnostic Studies & Laboratory data:     Recent Radiology Findings:   ECHOCARDIOGRAM COMPLETE  Result Date: 01/07/2020    ECHOCARDIOGRAM REPORT   [name] Date of Exam: 01/07/2020 Medical Rec #:  [id number]  Height:       75.0 in Accession #:    [id number]    Weight:       225.7 lb Date of Birth:  02-Jul-1970     BSA:          2.310 m Patient Age:    89 years      BP:           131/94 mmHg Patient Gender: M             HR:           92 bpm. Exam Location:  Inpatient Procedure: 2D Echo, Cardiac Doppler, Color Doppler and Intracardiac            Opacification Agent                                 MODIFIED REPORT: This report was modified by Candee Furbish MD on 01/07/2020 due to Added global HK.  Indications:     CHF-Acute systolic  History:         Patient has prior history of Echocardiogram examinations, most                  recent 12/30/2019. CHF, Defibrillator, Arrythmias:Atrial                  Fibrillation; Risk Factors:Hypertension, Diabetes and                  Dyslipidemia.  Sonographer:     Clayton Lefort RDCS (AE) Referring Phys:  [facility] Diagnosing Phys: Candee Furbish MD IMPRESSIONS  1. No LV thrombus. Left ventricular ejection fraction, by estimation, is <20%. The left  ventricle has severely decreased function. The left ventricle demonstrates global hypokinesis. The left ventricular internal cavity size was moderately dilated. Left  ventricular diastolic parameters are consistent with Grade III diastolic dysfunction (restrictive).  2. Right ventricular systolic function is mildly reduced. The right ventricular size is moderately enlarged. There is moderately elevated pulmonary artery systolic pressure. The estimated right ventricular systolic pressure is 38.2 mmHg.  3. Left atrial size was severely dilated.  4. Right atrial size was severely dilated.  5. The mitral valve is normal in structure. Mild to moderate mitral valve regurgitation. No evidence of mitral stenosis.  6. The aortic valve is normal in structure. Aortic valve regurgitation is not visualized. No aortic stenosis is present.  7. The inferior vena cava is dilated in size with <50% respiratory variability, suggesting right atrial pressure of 15 mmHg. Comparison(s): No significant change from prior study. Prior images reviewed side by side. FINDINGS  Left Ventricle: No LV thrombus. Left ventricular ejection fraction, by estimation, is <20%. The left ventricle has severely decreased function. The left ventricle demonstrates global hypokinesis. The left ventricular internal cavity size was moderately dilated. There is no left ventricular hypertrophy. Left ventricular diastolic parameters are consistent with Grade III diastolic dysfunction (restrictive). Right Ventricle: The right ventricular size is moderately enlarged. No increase in right ventricular wall thickness. Right ventricular systolic function is mildly reduced. There is moderately elevated pulmonary artery systolic pressure. The tricuspid regurgitant velocity is 2.81 m/s, and with an assumed right atrial pressure of 15 mmHg, the estimated right ventricular systolic pressure is 50.5 mmHg. Left Atrium: Left atrial size was severely dilated. Right Atrium: Right  atrial size was severely dilated. Pericardium: There is no evidence of pericardial effusion. Mitral Valve: The mitral  valve is normal in structure. Mild to moderate mitral valve regurgitation. No evidence of mitral valve stenosis. Tricuspid Valve: The tricuspid valve is normal in structure. Tricuspid valve regurgitation is trivial. No evidence of tricuspid stenosis. Aortic Valve: The aortic valve is normal in structure. Aortic valve regurgitation is not visualized. No aortic stenosis is present. Aortic valve mean gradient measures 4.0 mmHg. Aortic valve peak gradient measures 5.6 mmHg. Aortic valve area, by VTI measures 3.17 cm. Pulmonic Valve: The pulmonic valve was normal in structure. Pulmonic valve regurgitation is not visualized. No evidence of pulmonic stenosis. Aorta: The aortic root is normal in size and structure. Venous: The inferior vena cava is dilated in size with less than 50% respiratory variability, suggesting right atrial pressure of 15 mmHg. IAS/Shunts: No atrial level shunt detected by color flow Doppler. Additional Comments: A pacer wire is visualized.  LEFT VENTRICLE PLAX 2D LVIDd:         6.60 cm LVIDs:         6.00 cm LV PW:         1.60 cm LV IVS:        0.90 cm LVOT diam:     2.30 cm LV SV:         66 LV SV Index:   29 LVOT Area:     4.15 cm  RIGHT VENTRICLE            IVC RV Basal diam:  4.90 cm    IVC diam: 2.70 cm RV Mid diam:    4.60 cm RV S prime:     8.92 cm/s TAPSE (M-mode): 1.2 cm LEFT ATRIUM              Index       RIGHT ATRIUM           Index LA diam:        5.10 cm  2.21 cm/m  RA Area:     21.50 cm LA Vol (A2C):   128.0 ml 55.40 ml/m RA Volume:   68.10 ml  29.48 ml/m LA Vol (A4C):   129.0 ml 55.84 ml/m LA Biplane Vol: 130.0 ml 56.27 ml/m  AORTIC VALVE AV Area (Vmax):    3.15 cm AV Area (Vmean):   2.71 cm AV Area (VTI):     3.17 cm AV Vmax:           118.00 cm/s AV Vmean:          92.400 cm/s AV VTI:            0.210 m AV Peak Grad:      5.6 mmHg AV Mean Grad:      4.0  mmHg LVOT Vmax:         89.40 cm/s LVOT Vmean:        60.300 cm/s LVOT VTI:          0.160 m LVOT/AV VTI ratio: 0.76  AORTA Ao Root diam: 3.20 cm Ao Asc diam:  3.60 cm MR Peak grad: 85.4 mmHg   TRICUSPID VALVE MR Mean grad: 54.0 mmHg   TR Peak grad:   31.6 mmHg MR Vmax:      462.00 cm/s TR Vmax:        281.00 cm/s MR Vmean:     349.0 cm/s                           SHUNTS  Systemic VTI:  0.16 m                           Systemic Diam: 2.30 cm Candee Furbish MD Electronically signed by Candee Furbish MD Signature Date/Time: 01/07/2020/4:47:25 PM    Final (Updated)      I have independently reviewed the above radiologic studies and discussed with the patient   Recent Lab Findings: Lab Results  Component Value Date   WBC 10.3 12/29/2019   HGB 15.8 12/31/2019   HCT 49.2 12/29/2019   PLT 354 01/07/2020   GLUCOSE 207 (H) 01/19/2020   CHOL 176 10/01/2018   TRIG 126 10/01/2018   HDL 25 (L) 10/01/2018   LDLDIRECT 116.5 11/01/2011   LDLCALC 126 (H) 10/01/2018   ALT 26 03/23/2018   AST 26 03/23/2018   NA 133 (L) 01/14/2020   K 4.1 12/30/2019   CL 90 (L) 01/16/2020   CREATININE 2.02 (H) 01/09/2020   BUN 54 (H) 01/07/2020   CO2 32 01/11/2020   TSH 3.009 10/01/2018   INR 1.3 05/11/2013   HGBA1C 10.6 (H) 01/03/2020      Assessment / Plan:     Pt is discussed in detail with Dr. Aundra Dubin. His sCr is plateaued at 2. Agree with suggestion for impella 5.5 LVAD insertion today as we continue to sort out plan for revascularization.  He is tentatively scheduled for CABG next week (Wed). The patient expresses understanding of these plans, has had the opportunity to ask questions, and he wishes to proceed.     I  spent 30 minutes counseling the patient face to face.   Susan Bleich Z. [name], [place] 01/05/2020 9:14 AM

## 2020-01-08 NOTE — Progress Notes (Signed)
ANTICOAGULATION CONSULT NOTE - Follow Up Consult  Pharmacy Consult for heparin  Indication: atrial fibrillation  Allergies  Allergen Reactions  . Orange Fruit Anaphylaxis, Hives and Other (See Comments)    Per Pt- Blisters around lips and Hives all over, also  . Penicillins Anaphylaxis    Did it involve swelling of the face/tongue/throat, SOB, or low BP? Yes Did it involve sudden or severe rash/hives, skin peeling, or any reaction on the inside of your mouth or nose? Yes Did you need to seek medical attention at a hospital or doctor's office? No When did it last happen?childhood If all above answers are "NO", may proceed with cephalosporin use.  [name] [Empagliflozin] Itching  . Basaglar Kwikpen [Insulin Glargine] Nausea And Vomiting    Patient Measurements: Height: 6\' 3"  (190.5 cm) Weight: 101.9 kg (224 lb 10.4 oz) IBW/kg (Calculated) : 84.5 Heparin Dosing Weight: 110kg   Vital Signs: Temp: 98.7 F (37.1 C) (12/11 0755) Temp Source: Oral (12/11 0755) BP: 125/90 (12/11 0800) Pulse Rate: 85 (12/11 0800)  Labs: Recent Labs    01/06/20 0424 01/06/20 0607 01/06/20 1657 01/06/20 2215 01/07/20 0319 01/07/20 0321 01/17/2020 0425  HGB 14.6  --   --   --  15.2  --  15.8  HCT 46.5  --   --   --  47.0  --  49.2  PLT 225  --   --   --  275  --  354  APTT 45*  --   --   --  67*  --   --   HEPARINUNFRC  --    < >  --  0.34  --  0.35 0.45  CREATININE 1.77*  --  2.03*  --  2.06*  --  2.02*   < > = values in this interval not displayed.    Estimated Creatinine Clearance: 56 mL/min (A) (by C-G formula based on SCr of 2.02 mg/dL (H)).   Medical History: Past Medical History:  Diagnosis Date  . Anginal pain ([place])   . Anxiety   . Arthritis   . Automatic implantable cardioverter-defibrillator in situ 2007   [employer]   . Bipolar disorder ([place])   . CAD (coronary artery disease)    Cardiac cath (08/19/2000) - Nonobstructive, 40% stenosis of LAD,.   . Cancer [facility]) 2013    benign stomach cancer  . CHF (congestive heart failure) ([place])   . Chondrocalcinosis of right knee 06/11/2013  . Chronic systolic heart failure ([place])    2D echo (61/9509) - Systolic function severely reduced., LV EF 20-25%. Akinesis of apical and anteroseptal mycoardium.  last 2D echo (11/2008 ), LV EF 25-30%, diffuse hypokinesis, and grade 1 diastolic dysfunction.  . Depression   . Diabetes uncomplicated adult-type II    on insulin therapy, a1c 12.9 in 01/2011  . Dilated cardiomyopathy ([place])    status post ICD placement in 2008 c/b tearing at the atrial junction resulting in tamponade and urgent thoracotomy  . Dyslipidemia   . Dysrhythmia   . GERD (gastroesophageal reflux disease)   . Gout, unspecified   . Hepatic steatosis 2011   seen on ultrasound.   . History of kidney stones   . Hx of echocardiogram    Echo (04/2013):  Mild LVH, EF 20-25%, mild LAE, mild to mod RVE with mild reduced RVSF.  Marland Kitchen Hypertension   . Obesity (BMI 30.0-34.9)   . Pacemaker   . Paroxysmal atrial fibrillation (HCC)    on chronic coumadin, goal INR 2-3.  status post direct current  . Peptic ulcer   . Shortness of breath   . Sleep apnea    does not have CPAP    Assessment: Corey Palmer with atrial fibrillation on apixaban prior to admission.  Admit for HF exacerbation and s/p cath > apixaban held for cath  Last apixaban 12/6 at 8 am, will use aptt to dose heparin as apixaban interferes with the heparin levels.   Heparin drip 1600 units/hr heparin level at goal range today. CBC stable, no bleeding noted.   Possible plan to OR this afternoon for Impella 5.5.    Goal of Therapy:  aPTT 66-103 seconds seconds Monitor platelets by anticoagulation protocol: Yes   Plan:  Continue IV heparin 1600 units/hr. Daily heparin level and CBC Monitor s/s bleeding   Erin Hearing PharmD., BCPS Clinical Pharmacist 01/11/2020 9:21 AM    [facility] pharmacy phone numbers are listed on [email or web address]

## 2020-01-08 NOTE — Progress Notes (Signed)
Patient ID: Corey Palmer, male   DOB: 06-15-1970, 49 y.o.   MRN: [id number]     Advanced Heart Failure Rounding Note  PCP-Cardiologist: No primary care provider on file.   Subjective:    12/7 Torsades -ICD shock x1.   Stable this morning, walked in halls.  CVP 2-3 this morning, has been off diuretics.  Creatinine remains 2.02 with co-ox 76% on milrinone 0.25.   He remains in NSR on amiodarone gtt.   No chest pain or dyspnea.   Echo (12/10): EF <20%, moderate LV dilation, normal RV size with mildly decreased systolic function.   RHC/LHC: Coronary Findings   Diagnostic Dominance: Right  Left Main  30% distal left main stenosis.  Left Anterior Descending  40% ostial LAD stenosis. 90% stenosis proximal LAD just distal to D1 take-off. Large D1 with 99% proximal stenosis, 60% mid vessel stenosis.  Left Circumflex  Long up to 70% proximal LCx stenosis.  Right Coronary Artery  95% mid RCA stenosis. 75% distal RCA stenosis. 60% stenosis proximal PLV. 99% stenosis mid PDA.   Intervention   No interventions have been documented.  Right Heart  Right Heart Pressures RHC Procedural Findings: Hemodynamics (mmHg) RA mean 16 RV 53/12 PA 65/29, mean 46 PCWP mean 31 LV 119/28 AO 118/87  Oxygen saturations: PA 45% AO 93%  Cardiac Output (Fick) 3.39  Cardiac Index (Fick) 1.4 PVR 4.4 WU PAPi 2.25     Objective:   Weight Range: 101.9 kg Body mass index is 28.08 kg/m.   Vital Signs:   Temp:  [97.6 F (36.4 C)-98.7 F (37.1 C)] 98.7 F (37.1 C) (12/11 0755) Pulse Rate:  [87-99] 99 (12/11 0700) Resp:  [10-25] 16 (12/11 0700) BP: (101-139)/(66-100) 133/99 (12/11 0700) SpO2:  [89 %-96 %] 90 % (12/11 0700) Weight:  [101.9 kg] 101.9 kg (12/11 0600) Last BM Date: 01/07/20  Weight change: Filed Weights   01/06/20 0500 01/07/20 0500 01/19/2020 0600  Weight: 103.9 kg 102.4 kg 101.9 kg    Intake/Output:   Intake/Output Summary (Last 24 hours) at 12/29/2019 0817 Last  data filed at 01/03/2020 0700 Gross per 24 hour  Intake 2495.11 ml  Output 3200 ml  Net -704.89 ml      Physical Exam  CVP 2-3 General: NAD Neck: No JVD, no thyromegaly or thyroid nodule.  Lungs: Clear to auscultation bilaterally with normal respiratory effort. CV: Lateral PMI.  Heart regular S1/S2, no S3/S4, no murmur.  No peripheral edema.   Abdomen: Soft, nontender, no hepatosplenomegaly, no distention.  Skin: Intact without lesions or rashes.  Neurologic: Alert and oriented x 3.  Psych: Normal affect. Extremities: No clubbing or cyanosis.  HEENT: Normal.    Telemetry    NSR 80s, personally reviewed    Labs    CBC Recent Labs    01/07/20 0319 01/23/2020 0425  WBC 8.8 10.3  HGB 15.2 15.8  HCT 47.0 49.2  MCV 80.8 80.8  PLT 275 616   Basic Metabolic Panel Recent Labs    01/07/20 0319 01/18/2020 0425  NA 132* 133*  K 3.8 4.1  CL 82* 90*  CO2 35* 32  GLUCOSE 229* 207*  BUN 47* 54*  CREATININE 2.06* 2.02*  CALCIUM 8.8* 9.3  MG 2.1 2.2   Liver Function Tests No results for input(s): AST, ALT, ALKPHOS, BILITOT, PROT, ALBUMIN in the last 72 hours. No results for input(s): LIPASE, AMYLASE in the last 72 hours. Cardiac Enzymes No results for input(s): CKTOTAL, CKMB, CKMBINDEX, TROPONINI in the last  72 hours.  BNP: BNP (last 3 results) Recent Labs    01/08/2020 1619  BNP 577.5*    ProBNP (last 3 results) No results for input(s): PROBNP in the last 8760 hours.   D-Dimer No results for input(s): DDIMER in the last 72 hours. Hemoglobin A1C No results for input(s): HGBA1C in the last 72 hours. Fasting Lipid Panel No results for input(s): CHOL, HDL, LDLCALC, TRIG, CHOLHDL, LDLDIRECT in the last 72 hours. Thyroid Function Tests No results for input(s): TSH, T4TOTAL, T3FREE, THYROIDAB in the last 72 hours.  Invalid input(s): FREET3  Other results:   Imaging    ECHOCARDIOGRAM COMPLETE  Result Date: 01/07/2020    ECHOCARDIOGRAM REPORT   Patient  Name:   Corey Palmer Date of Exam: 01/07/2020 Medical Rec #:  [id number]     Height:       75.0 in Accession #:    [id number]    Weight:       225.7 lb Date of Birth:  [birth year]     BSA:          2.310 m Patient Age:    8 years      BP:           131/94 mmHg Patient Gender: M             HR:           92 bpm. Exam Location:  Inpatient Procedure: 2D Echo, Cardiac Doppler, Color Doppler and Intracardiac            Opacification Agent                                 MODIFIED REPORT: This report was modified by Candee Furbish MD on 01/07/2020 due to Added global HK.  Indications:     CHF-Acute systolic  History:         Patient has prior history of Echocardiogram examinations, most                  recent 12/30/2019. CHF, Defibrillator, Arrythmias:Atrial                  Fibrillation; Risk Factors:Hypertension, Diabetes and                  Dyslipidemia.  Sonographer:     Clayton Lefort RDCS (AE) Referring Phys:  [facility] Diagnosing Phys: Candee Furbish MD IMPRESSIONS  1. No LV thrombus. Left ventricular ejection fraction, by estimation, is <20%. The left ventricle has severely decreased function. The left ventricle demonstrates global hypokinesis. The left ventricular internal cavity size was moderately dilated. Left  ventricular diastolic parameters are consistent with Grade III diastolic dysfunction (restrictive).  2. Right ventricular systolic function is mildly reduced. The right ventricular size is moderately enlarged. There is moderately elevated pulmonary artery systolic pressure. The estimated right ventricular systolic pressure is 65.0 mmHg.  3. Left atrial size was severely dilated.  4. Right atrial size was severely dilated.  5. The mitral valve is normal in structure. Mild to moderate mitral valve regurgitation. No evidence of mitral stenosis.  6. The aortic valve is normal in structure. Aortic valve regurgitation is not visualized. No aortic stenosis is present.  7. The inferior vena cava is dilated in  size with <50% respiratory variability, suggesting right atrial pressure of 15 mmHg. Comparison(s): No significant change from prior study. Prior images reviewed side by side.  FINDINGS  Left Ventricle: No LV thrombus. Left ventricular ejection fraction, by estimation, is <20%. The left ventricle has severely decreased function. The left ventricle demonstrates global hypokinesis. The left ventricular internal cavity size was moderately dilated. There is no left ventricular hypertrophy. Left ventricular diastolic parameters are consistent with Grade III diastolic dysfunction (restrictive). Right Ventricle: The right ventricular size is moderately enlarged. No increase in right ventricular wall thickness. Right ventricular systolic function is mildly reduced. There is moderately elevated pulmonary artery systolic pressure. The tricuspid regurgitant velocity is 2.81 m/s, and with an assumed right atrial pressure of 15 mmHg, the estimated right ventricular systolic pressure is 93.8 mmHg. Left Atrium: Left atrial size was severely dilated. Right Atrium: Right atrial size was severely dilated. Pericardium: There is no evidence of pericardial effusion. Mitral Valve: The mitral valve is normal in structure. Mild to moderate mitral valve regurgitation. No evidence of mitral valve stenosis. Tricuspid Valve: The tricuspid valve is normal in structure. Tricuspid valve regurgitation is trivial. No evidence of tricuspid stenosis. Aortic Valve: The aortic valve is normal in structure. Aortic valve regurgitation is not visualized. No aortic stenosis is present. Aortic valve mean gradient measures 4.0 mmHg. Aortic valve peak gradient measures 5.6 mmHg. Aortic valve area, by VTI measures 3.17 cm. Pulmonic Valve: The pulmonic valve was normal in structure. Pulmonic valve regurgitation is not visualized. No evidence of pulmonic stenosis. Aorta: The aortic root is normal in size and structure. Venous: The inferior vena cava is dilated  in size with less than 50% respiratory variability, suggesting right atrial pressure of 15 mmHg. IAS/Shunts: No atrial level shunt detected by color flow Doppler. Additional Comments: A pacer wire is visualized.  LEFT VENTRICLE PLAX 2D LVIDd:         6.60 cm LVIDs:         6.00 cm LV PW:         1.60 cm LV IVS:        0.90 cm LVOT diam:     2.30 cm LV SV:         66 LV SV Index:   29 LVOT Area:     4.15 cm  RIGHT VENTRICLE            IVC RV Basal diam:  4.90 cm    IVC diam: 2.70 cm RV Mid diam:    4.60 cm RV S prime:     8.92 cm/s TAPSE (M-mode): 1.2 cm LEFT ATRIUM              Index       RIGHT ATRIUM           Index LA diam:        5.10 cm  2.21 cm/m  RA Area:     21.50 cm LA Vol (A2C):   128.0 ml 55.40 ml/m RA Volume:   68.10 ml  29.48 ml/m LA Vol (A4C):   129.0 ml 55.84 ml/m LA Biplane Vol: 130.0 ml 56.27 ml/m  AORTIC VALVE AV Area (Vmax):    3.15 cm AV Area (Vmean):   2.71 cm AV Area (VTI):     3.17 cm AV Vmax:           118.00 cm/s AV Vmean:          92.400 cm/s AV VTI:            0.210 m AV Peak Grad:      5.6 mmHg AV Mean Grad:      4.0  mmHg LVOT Vmax:         89.40 cm/s LVOT Vmean:        60.300 cm/s LVOT VTI:          0.160 m LVOT/AV VTI ratio: 0.76  AORTA Ao Root diam: 3.20 cm Ao Asc diam:  3.60 cm MR Peak grad: 85.4 mmHg   TRICUSPID VALVE MR Mean grad: 54.0 mmHg   TR Peak grad:   31.6 mmHg MR Vmax:      462.00 cm/s TR Vmax:        281.00 cm/s MR Vmean:     349.0 cm/s                           SHUNTS                           Systemic VTI:  0.16 m                           Systemic Diam: 2.30 cm Candee Furbish MD Electronically signed by Candee Furbish MD Signature Date/Time: 01/07/2020/4:47:25 PM    Final (Updated)      Medications:     Scheduled Medications: . atorvastatin  80 mg Oral Daily  . busPIRone  10 mg Oral TID  . Chlorhexidine Gluconate Cloth  6 each Topical Daily  . colchicine  0.6 mg Oral Daily  . dapagliflozin propanediol  10 mg Oral Daily  . dicyclomine  10 mg Oral TID AC   . digoxin  0.125 mg Oral Daily  . divalproex  500 mg Oral QHS  . FLUoxetine  40 mg Oral BID  . insulin aspart  0-15 Units Subcutaneous TID WC  . insulin aspart  0-5 Units Subcutaneous QHS  . insulin aspart  3 Units Subcutaneous TID AC  . insulin glargine  10 Units Subcutaneous Daily  . magnesium oxide  200 mg Oral Daily  . potassium chloride  40 mEq Oral Daily  . sodium chloride flush  10-40 mL Intracatheter Q12H  . sodium chloride flush  3 mL Intravenous Q12H  . spironolactone  25 mg Oral Daily    Infusions: . sodium chloride Stopped (01/06/20 1204)  . amiodarone 30 mg/hr (01/28/2020 0700)  . heparin 1,600 Units/hr (01/13/2020 0700)  . milrinone 0.25 mcg/kg/min (01/27/2020 0700)    PRN Medications: sodium chloride, acetaminophen, ondansetron (ZOFRAN) IV, sodium chloride flush, traMADol     Assessment/Plan   1. Atrial fibrillation: H/o PAF.  He was on dofetilide in the remote past but this was stopped due to noncompliance. He had an upper GI bleed from antral ulcers in 3/12. He was seen by GI and was deemed safe to restart anticoagulation as long as he remains on a PPI.  No apparent recurrence of AF until just prior to this admission, was cardioverted back to NSR in ER but went back into atrial fibrillation.  Now he has returned to NSR.  - Continue amiodarone gtt 30 mg/hr.  - Continue heparin gtt.  - Maze with CABG.  2. CAD: Nonobstructive mild CAD on remote cath in 2002. He has had chest pain but is pleuritic.  HS-TnI 64 => 92, suspect demand ischemia with volume overload and afib/RVR.   Cath this admission with severe 3 vessel disease, ideally treated by CABG given extent of disease.  If no CABG, could approach with multivessel PCI but would be complex  and need Impella support.  - Continue atorvastatin, ASA.  - Dr. [name] consulting, considering high-risk CABG early next week, likely supported by Impella 5.5.  - Unable to do cardiac MRI for viability given old ICD leads.  However,  no thinning of the myocardium noted.  3. Acute on chronic systolic CHF: Initially nonischemic cardiomyopathy. Possible familial cardiomyopathy as both parents had cardiomyopathy and died at around 84.However, [employer] gene testing did not show any common mutation for cardiomyopathy.  However, this admission noted to have severe 3 vessel disease so suspect component of ischemic cardiomyopathy. [employer] ICD. Echo in 8/20 with EF 15% and mildly decreased RV function. Echo this admission with EF < 20%, moderate LV dilation, RV mildly reduced, severe LAE, no significant MR.Low output HF with markedly low EF.  He has a long history of cardiomyopathy (20 yrs), tends to minimize symptoms.  Cardiorenal syndrome with creatinine up to 2.2, now stabilized on milrinone.  This morning, Co-ox 76% with CVP 2-3, creatinine remaining at 2.  Now off diuretics.  - With creatinine still elevated from baseline (1.38 prior to admission, as low as 1.7 this admission and now staying at 2), discussed pre-op optimization with Impella 5.5 with Dr. [name].  Think this may be our best course as he will need peri-op Impella support. Plan for placement this afternoon.   - Continue milrinone 0.25. Hopefully can wean off after Impella.  - Hold diuretics with low CVP.  - Continue dig 0.125 mg daily. Level ok today.    - Continue spironolactone 25 mg daily.  - Continue dapagliflozin.  - Coreg held with low output.   - Continue Unna boots.  - As above, considering high risk CABG next week with severe 3VD.  Alternatively, may need to consider directly to LVAD if thought too high risk.  RV function is reasonable, at most mildly hypokinetic on echo 12/10.  4. OSA: OSA on sleep study, has not been using CPAP.  - Start CPAP here.  5. AKI on CKD stage ?3: Last creatinine prior to admission was 1.38.  Creatinine peaked 2.2 this admission.  Suspect cardiorenal syndrome. Creatinine remaining at 2.  - Holding Lasix.   - See discussion above  regarding Impella placement.  6. Diabetes: hgbA1c 10.6.  - Insulin sliding scale.  - Consulted diabetes coordinator.  - Followed by Dr Loanne Drilling.  7. Hyponatremia: Hypervolemic hyponatremia: Fluid restrict.  8. Torsades/ICD shock: 12/7/212 in hospital event, in setting of severe hypokalemia and hypomagnesemia, K 2.8, Mg 1.7.  - Follow K and Mg closely.  - Continue IV amio  9. Gout: h/o gout. Complains of rt knee pain c/w previous flares - treated w/ prednisone burst (completed).  - Continue colchicine 0.6 (takes with relief at home).   CRITICAL CARE Performed by: Loralie Champagne  Total critical care time: 35 minutes  Critical care time was exclusive of separately billable procedures and treating other patients.  Critical care was necessary to treat or prevent imminent or life-threatening deterioration.  Critical care was time spent personally by me on the following activities: development of treatment plan with patient and/or surrogate as well as nursing, discussions with consultants, evaluation of patient's response to treatment, examination of patient, obtaining history from patient or surrogate, ordering and performing treatments and interventions, ordering and review of laboratory studies, ordering and review of radiographic studies, pulse oximetry and re-evaluation of patient's condition.  Loralie Champagne 01/23/2020 8:17 AM

## 2020-01-08 NOTE — Progress Notes (Signed)
[facility] Progress Note Patient Name: Corey Palmer DOB: 12/12/1970 MRN: [id number]   Date of Service  01/01/2020  HPI/Events of Note  Patient needs CBG and SSI regimen changed to Q 4 hours following intubation.  eICU Interventions  CBG and SSI changed to Q 4 hours.        Kerry Kass Leondre Taul 01/12/2020, 8:38 PM

## 2020-01-08 NOTE — Progress Notes (Addendum)
Notified Dr. Elsie Ra of full breakfast and Dr. [name] will delay case until 1430. Linna Hoff, RN. Patient will be transported back to [place] until closer to time for surgery.   Based on the ability to get patient back to [place] patient will remain in Short Stay with continuous monitoring.

## 2020-01-08 NOTE — Brief Op Note (Signed)
01/24/2020 - 01/25/2020  4:19 PM  PATIENT:  Corey Palmer  49 y.o. male  PRE-OPERATIVE DIAGNOSIS:  HEART FAILURE  POST-OPERATIVE DIAGNOSIS:  HEART FAILURE  PROCEDURE:  Procedure(s): AXILLARY  ARTERY PLACEMENT OF ABIOMED IMPELLA  5.5 LEFT VENTRICULAR ASSIST DEVICE (Right)  SURGEON:  Surgeon(s) and Role:    * [name], MD - Primary  PHYSICIAN ASSISTANT: n/a  ASSISTANTS: staff   ANESTHESIA:   general  EBL:  minimal  BLOOD ADMINISTERED:none  DRAINS: none   LOCAL MEDICATIONS USED:  NONE  SPECIMEN:  No Specimen  DISPOSITION OF SPECIMEN:  N/A  COUNTS:  YES  TOURNIQUET:  * No tourniquets in log *  DICTATION: .Note written in EPIC  PLAN OF CARE: Admit to inpatient   PATIENT DISPOSITION:  ICU - intubated and critically ill.   Delay start of Pharmacological VTE agent (>24hrs) due to surgical blood loss or risk of bleeding: yes

## 2020-01-08 NOTE — Anesthesia Procedure Notes (Signed)
Arterial Line Insertion Start/End12/08/2019 12:56 PM, 01/08/2020 12:58 PM Performed by: Wilburn Cornelia, CRNA, CRNA  Patient location: Pre-op. Preanesthetic checklist: patient identified, IV checked, site marked, risks and benefits discussed, surgical consent, monitors and equipment checked, pre-op evaluation, timeout performed and anesthesia consent Lidocaine 1% used for infiltration and patient sedated Left, radial was placed Catheter size: 20 G Hand hygiene performed  and maximum sterile barriers used  Allen's test indicative of satisfactory collateral circulation Attempts: 1 Procedure performed without using ultrasound guided technique. Following insertion, Biopatch and dressing applied. Post procedure assessment: normal

## 2020-01-08 NOTE — Progress Notes (Signed)
NAME:  Corey Palmer, MRN:  [id number], DOB:  December 29, 1970, LOS: 9 ADMISSION DATE:  01/28/2020, CONSULTATION DATE: 01/22/2020 REFERRING MD: Aundra Dubin, CHIEF COMPLAINT: Respiratory failure post Impella  HPI/course in hospital  49 year old man underwent Impella 5.5 insertion today for persistent symptoms of forward failure with low cardiac indices.  He presented last week for ICD shocks and was found to be in rapid atrial fibrillation for which he was cardioverted.  He was also found to be in decompensated heart failure with an ejection fraction of 15% and multivessel coronary disease on left heart catheterization.  He was started on tropes and diuresed which improved his congestive symptoms but his index remained low so an Impella was placed to optimize his hemodynamics prior to a high risk redo-bypass.  Uneventful Impella implantation.  Patient returned from operating room intubated  Past Medical History   Past Medical History:  Diagnosis Date  . Anginal pain ([place])   . Anxiety   . Arthritis   . Automatic implantable cardioverter-defibrillator in situ 2007   [employer]   . Bipolar disorder ([place])   . CAD (coronary artery disease)    Cardiac cath (08/19/2000) - Nonobstructive, 40% stenosis of LAD,.   . Cancer [facility]) 2013   benign stomach cancer  . CHF (congestive heart failure) ([place])   . Chondrocalcinosis of right knee 06/11/2013  . Chronic systolic heart failure ([place])    2D echo (18/2993) - Systolic function severely reduced., LV EF 20-25%. Akinesis of apical and anteroseptal mycoardium.  last 2D echo (11/2008 ), LV EF 25-30%, diffuse hypokinesis, and grade 1 diastolic dysfunction.  . Depression   . Diabetes uncomplicated adult-type II    on insulin therapy, a1c 12.9 in 01/2011  . Dilated cardiomyopathy ([place])    status post ICD placement in 2008 c/b tearing at the atrial junction resulting in tamponade and urgent thoracotomy  . Dyslipidemia   . Dysrhythmia   . GERD (gastroesophageal  reflux disease)   . Gout, unspecified   . Hepatic steatosis 2011   seen on ultrasound.   . History of kidney stones   . Hx of echocardiogram    Echo (04/2013):  Mild LVH, EF 20-25%, mild LAE, mild to mod RVE with mild reduced RVSF.  Marland Kitchen Hypertension   . Obesity (BMI 30.0-34.9)   . Pacemaker   . Paroxysmal atrial fibrillation (H[facility])    on chronic coumadin, goal INR 2-3. status post direct current  . Peptic ulcer   . Shortness of breath   . Sleep apnea    does not have CPAP     Past Surgical History:  Procedure Laterality Date  . BIOPSY  12/01/2019   Procedure: BIOPSY;  Surgeon: Arta Silence, MD;  Location: [facility] ENDOSCOPY;  Service: Endoscopy;;  . CARDIAC CATHETERIZATION    . CARDIAC PACEMAKER PLACEMENT    . CHOLECYSTECTOMY    . COLONOSCOPY    . CORONARY ARTERY BYPASS GRAFT    . ESOPHAGOGASTRODUODENOSCOPY  04/05/2011   Procedure: ESOPHAGOGASTRODUODENOSCOPY (EGD);  Surgeon: Irene Shipper, MD;  Location: [facility] ENDOSCOPY;  Service: Endoscopy;  Laterality: N/A;  . ESOPHAGOGASTRODUODENOSCOPY (EGD) WITH PROPOFOL N/A 12/01/2019   Procedure: ESOPHAGOGASTRODUODENOSCOPY (EGD) WITH PROPOFOL;  Surgeon: Arta Silence, MD;  Location: [facility] ENDOSCOPY;  Service: Endoscopy;  Laterality: N/A;  . JOINT REPLACEMENT     left knee replacement  . KNEE ARTHROSCOPY Right 06/11/2013   Procedure: RIGHT KNEE ARTHROSCOPY with chondroplasty;  Surgeon: Johnny Bridge, MD;  Location: [place];  Service: Orthopedics;  Laterality: Right;  .  RIGHT/LEFT HEART CATH AND CORONARY ANGIOGRAPHY N/A 01/28/2020   Procedure: RIGHT/LEFT HEART CATH AND CORONARY ANGIOGRAPHY;  Surgeon: Larey Dresser, MD;  Location: [place] CV LAB;  Service: Cardiovascular;  Laterality: N/A;  . TOTAL KNEE ARTHROPLASTY Left      Review of Systems:   Review of Systems  Unable to perform ROS: Intubated    Social History   reports that he has never smoked. He has never used smokeless tobacco. He reports that he does not drink alcohol and does not use  drugs.   Family History   His family history includes Coronary artery disease (age of onset: 30) in his father and mother; Diabetes in his father and mother; Gout in his mother; Heart attack (age of onset: 25) in his mother; Heart disease in his brother, father, and mother.   Allergies Allergies  Allergen Reactions  . Orange Fruit Anaphylaxis, Hives and Other (See Comments)    Per Pt- Blisters around lips and Hives all over, also  . Penicillins Anaphylaxis    Did it involve swelling of the face/tongue/throat, SOB, or low BP? Yes Did it involve sudden or severe rash/hives, skin peeling, or any reaction on the inside of your mouth or nose? Yes Did you need to seek medical attention at a hospital or doctor's office? No When did it last happen?childhood If all above answers are "NO", may proceed with cephalosporin use.  [name] [Empagliflozin] Itching  . [name] [Insulin Glargine] Nausea And Vomiting     Home Medications  Prior to Admission medications   Medication Sig Start Date End Date Taking? Authorizing Provider  apixaban (ELIQUIS) 5 MG TABS tablet Take 1 tablet (5 mg total) by mouth 2 (two) times daily. 12/02/19  Yes Arta Silence, MD  aspirin 81 MG chewable tablet Chew 324 mg by mouth as needed (for chest pain).   Yes [provider]  busPIRone (BUSPAR) 10 MG tablet Take 1 tablet (10 mg total) by mouth 3 (three) times daily. 12/06/19 12/05/20 Yes Pucilowski, Olgierd A, MD  carvedilol (COREG) 25 MG tablet Take 1 tablet (25 mg total) by mouth 2 (two) times daily. Needs appt Patient taking differently: Take 25 mg by mouth 2 (two) times daily.  09/10/19  Yes Larey Dresser, MD  dapagliflozin propanediol (FARXIGA) 10 MG TABS tablet Take 10 mg by mouth daily before breakfast. 02/22/19  Yes Larey Dresser, MD  dicyclomine (BENTYL) 10 MG capsule Take 10 mg by mouth 3 (three) times daily before meals.   Yes [provider]  digoxin (LANOXIN) 0.125 MG tablet  Take 1 tablet (0.125 mg total) by mouth daily. 02/19/19 02/11/2020 Yes Larey Dresser, MD  divalproex (DEPAKOTE ER) 500 MG 24 hr tablet Take 1 tablet (500 mg total) by mouth at bedtime. 12/06/19 12/05/20 Yes Pucilowski, Olgierd A, MD  FLUoxetine (PROZAC) 20 MG capsule Take 4 capsules (80 mg total) by mouth daily. Patient taking differently: Take 40 mg by mouth 2 (two) times daily.  12/06/19 12/05/20 Yes Pucilowski, Olgierd A, MD  furosemide (LASIX) 20 MG tablet TAKE 2 TABLETS (40 MG TOTAL) BY MOUTH IN THE MORNING AND 1 TABLET (20 MG TOTAL) EVERY EVENING. Patient taking differently: TAKE 2 TABLETS (40 MG TOTAL) BY MOUTH IN THE MORNING AND 1 TABLET (20 MG TOTAL) EVERY EVENING 11/08/19  Yes Larey Dresser, MD  hydrOXYzine (VISTARIL) 50 MG capsule Take 1 capsule (50 mg total) by mouth 3 (three) times daily as needed for anxiety. 12/06/19 12/05/20 Yes Pucilowski,  Olgierd A, MD  insulin aspart (NOVOLOG FLEXPEN) 100 UNIT/ML FlexPen Inject 5-50 Units into the skin See admin instructions. Inject 5-50 units into the skin three times a day before meals, PER SLIDING SCALE   Yes [provider]  magnesium oxide (MAG-OX) 400 MG tablet Take 800 mg by mouth daily.    Yes [provider]  omeprazole (PRILOSEC) 40 MG capsule Take 1 capsule (40 mg total) by mouth daily. Patient taking differently: Take 40 mg by mouth 2 (two) times daily before a meal.  08/18/13  Yes Larey Dresser, MD  ondansetron (ZOFRAN) 4 MG tablet Take 4 mg by mouth every 8 (eight) hours as needed for nausea or vomiting.   Yes [provider]  POTASSIUM PO Take 4 tablets by mouth in the morning.   Yes [provider]  promethazine (PHENERGAN) 25 MG tablet Take 25 mg by mouth every 8 (eight) hours as needed for nausea or vomiting.  10/12/19  Yes [provider]  sacubitril-valsartan (ENTRESTO) 49-51 MG Take 1 tablet by mouth 2 (two) times daily. 02/19/19  Yes Larey Dresser, MD  Semaglutide,0.25 or 0.5MG /DOS,  (OZEMPIC, 0.25 OR 0.5 MG/DOSE,) 2 MG/1.5ML SOPN Inject 0.5 mg into the skin once a week. Patient taking differently: Inject 0.5 mg into the skin every Sunday.  12/01/19  Yes Renato Shin, MD  spironolactone (ALDACTONE) 25 MG tablet Take 1 tablet (25 mg total) by mouth daily. Needs appt Patient taking differently: Take 25 mg by mouth daily.  06/22/19  Yes Larey Dresser, MD  glucose blood (CONTOUR TEST) test strip 1 each by Other route 2 (two) times daily. And lancets 1/day 02/03/19   Renato Shin, MD  insulin glargine (LANTUS SOLOSTAR) 100 UNIT/ML Solostar Pen Inject 40 Units into the skin every morning. 12/01/19   Renato Shin, MD     Interim history/subjective:  On no vasopressor support, will occasionally awaken with coughing fit.  Objective   Blood pressure (!) 140/100, pulse 87, temperature 98.7 F (37.1 C), temperature source Oral, resp. rate 13, height 6\' 3"  (1.905 m), weight 101.9 kg, SpO2 92 %. CVP:  [6 mmHg-10 mmHg] 8 mmHg  Vent Mode: PRVC FiO2 (%):  [100 %] 100 % Set Rate:  [18 bmp] 18 bmp Vt Set:  [670 mL] 670 mL PEEP:  [5 cmH20] 5 cmH20 Plateau Pressure:  [21 cmH20] 21 cmH20   Intake/Output Summary (Last 24 hours) at 01/10/2020 1756 Last data filed at 01/04/2020 1700 Gross per 24 hour  Intake 2290.69 ml  Output 2850 ml  Net -559.31 ml   Filed Weights   01/06/20 0500 01/07/20 0500 01/20/2020 0600  Weight: 103.9 kg 102.4 kg 101.9 kg   CVP:  [6 mmHg-10 mmHg] 8 mmHg  Examination: Physical Exam Constitutional:      Appearance: Normal appearance. He is overweight.     Interventions: He is sedated and intubated.  HENT:     Head:     Comments: Orally intubated OG tube in place Eyes:     Conjunctiva/sclera: Conjunctivae normal.  Cardiovascular:     Rate and Rhythm: Normal rate and regular rhythm.     Heart sounds: Heart sounds are distant. No murmur heard. No friction rub.     Comments: Bilateral Unna boots. Pulmonary:     Effort: He is intubated.  Chest:      Comments: Healed median sternotomy.  Right subclavian Impella Abdominal:     General: Abdomen is flat.     Palpations: Abdomen is soft.  Genitourinary:    Comments: Foley catheter in place with clear urine Skin:    General: Skin is warm.     Capillary Refill: Capillary refill takes 2 to 3 seconds.  Neurological:     Mental Status: He is unresponsive.   I performed a limited point-of-care echocardiogram which showed significantly decreased LV systolic function, moderate RV dysfunction.  RV appears decompressed.  IVC is moderately distended with 16% distensibility with respiration suggesting RAP of 10-15.  Impella tip at 5.7 cm from the aortic valve  Ancillary tests (personally reviewed)  CBC: Recent Labs  Lab 01/04/20 1017 01/26/2020 0411 01/16/2020 1358 01/06/20 0424 01/07/20 0319 01/10/2020 0425 01/18/2020 1448 01/09/2020 1452 01/14/2020 1530  WBC 7.4 10.9*  --  7.4 8.8 10.3  --   --   --   HGB 13.2 14.6   < > 14.6 15.2 15.8 17.3* 17.7* 16.7  HCT 39.1 45.7   < > 46.5 47.0 49.2 51.0 52.0 49.0  MCV 80.6 82.0  --  83.6 80.8 80.8  --   --   --   PLT 221 246  --  225 275 354  --   --   --    < > = values in this interval not displayed.    Basic Metabolic Panel: Recent Labs  Lab 01/04/20 0337 01/04/20 1423 01/28/2020 0411 01/14/2020 1358 01/06/20 0424 01/06/20 1657 01/07/20 0319 01/07/2020 0425 01/05/2020 1448 12/30/2019 1452 01/26/2020 1530  NA 130*   < > 128*   < > 132* 130* 132* 133* 135 135 135  K 2.8*   < > 4.5   < > 3.0* 3.9 3.8 4.1 3.8 3.8 3.9  CL 81*   < > 81*  --  80* 80* 82* 90* 95*  --  94*  CO2 32   < > 30  --  35* 35* 35* 32  --   --   --   GLUCOSE 417*   < > 426*  --  312* 338* 229* 207* 176*  --  183*  BUN 37*   < > 44*  --  40* 42* 47* 54* 52*  --  51*  CREATININE 1.76*   < > 1.83*  --  1.77* 2.03* 2.06* 2.02* 1.60*  --  1.60*  CALCIUM 8.6*   < > 8.6*  --  8.7* 9.0 8.8* 9.3  --   --   --   MG 1.7  --  2.2  --  1.9  --  2.1 2.2  --   --   --    < > = values in this  interval not displayed.   GFR: Estimated Creatinine Clearance: 70.7 mL/min (A) (by C-G formula based on SCr of 1.6 mg/dL (H)). Recent Labs  Lab 01/10/2020 0411 01/06/20 0424 01/07/20 0319 01/04/2020 0425  WBC 10.9* 7.4 8.8 10.3    Liver Function Tests: No results for input(s): AST, ALT, ALKPHOS, BILITOT, PROT, ALBUMIN in the last 168 hours. No results for input(s): LIPASE, AMYLASE in the last 168 hours. No results for input(s): AMMONIA in the last 168 hours.  ABG    Component Value Date/Time   PHART 7.362 01/02/2020 1452   PCO2ART 58.1 (H) 01/28/2020 1452   PO2ART 397 (H) 01/14/2020 1452   HCO3 32.9 (H) 01/18/2020 1452   TCO2 28 01/04/2020 1530   ACIDBASEDEF 5.4 (H) 03/09/2008 0400   O2SAT 100.0 01/21/2020 1452     Coagulation Profile: No results for input(s): INR, PROTIME in the last 168 hours.  Cardiac Enzymes: No results for input(s): CKTOTAL, CKMB, CKMBINDEX, TROPONINI in the last 168 hours.  HbA1C: Hemoglobin A1C  Date/Time Value Ref Range Status  12/01/2019 04:04 PM 10.8 (A) 4.0 - 5.6 % Final  02/03/2019 04:24 PM 12.5 (A) 4.0 - 5.6 % Final   Hgb A1c MFr Bld  Date/Time Value Ref Range Status  01/03/2020 03:41 PM 10.6 (H) 4.8 - 5.6 % Final    Comment:    (NOTE) Pre diabetes:          5.7%-6.4%  Diabetes:              >6.4%  Glycemic control for   <7.0% adults with diabetes   10/01/2018 04:02 PM 11.9 (H) 4.8 - 5.6 % Final    Comment:    (NOTE) Pre diabetes:          5.7%-6.4% Diabetes:              >6.4% Glycemic control for   <7.0% adults with diabetes     CBG: Recent Labs  Lab 01/07/20 0612 01/07/20 1124 01/07/20 1542 01/07/20 2108 01/02/2020 0627  GLUCAP 217* 297* 278* 338* 191*   Chest x-ray 12/11-ET tube in place, OG tube and Impella in place, no pneumothorax, bilateral increased insertional markings consistent with pulmonary edema.  Assessment & Plan:  Critically ill due to expected acute hypoxic respiratory failure require mechanical  ventilation following Impella implantation Critically ill due to acute decompensated systolic heart failure requiring mechanical cardiac support and titration of inotropes Severe coronary artery disease status post bypass Bipolar affective Type 2 diabetes with poor control Atrial fibrillation requiring amiodarone infusion.   Plan:  -Continue current hemodynamic support with milrinone and Impella at P8 -Echo suggests higher filling pressures than measured by PAC. -Follow serial SCV O2 -Hold diuretic for this evening to ensure adequate pump flow appears to be diuresing spontaneously with improved CI -Wean sedation proceed to extubation if possible -Continue amiodarone infusion  Daily Goals Checklist  Pain/Anxiety/Delirium protocol (if indicated): As needed fentanyl, dexmedetomidine infusion VAP protocol (if indicated): Bundle in place Respiratory support goals: SIMV rapid wean protocol Blood pressure target: Keep MAP greater than 65 continue current Impella support DVT prophylaxis: SCDs for now, heparin purge Nutritional status and feeding goals: Moderate nutritional risk.  Hold feeds pending GI prophylaxis: Protonix Fluid status goals: Allow autoregulation Urinary catheter: Assessment of intravascular volume Central lines: Right arm PICC line, right IJ introducer with Swan, arterial line left radial Glucose control: Type 2 diabetes with adequate glycemic control on glargine and sliding scale Mobility/therapy needs: Bedrest, progressive ambulation once extubated Antibiotic de-escalation: Perioperative only Home medication reconciliation: Reordered Daily labs: CBC, BMP in a.m. Code Status: Full code Family Communication: Per cardiac surgery Disposition: ICU  CRITICAL CARE Performed by: Kipp Brood   Total critical care time: 45 minutes  Critical care time was exclusive of separately billable procedures and treating other patients.  Critical care was necessary to treat or  prevent imminent or life-threatening deterioration.  Critical care was time spent personally by me on the following activities: development of treatment plan with patient and/or surrogate as well as nursing, discussions with consultants, evaluation of patient's response to treatment, examination of patient, obtaining history from patient or surrogate, ordering and performing treatments and interventions, ordering and review of laboratory studies, ordering and review of radiographic studies, pulse oximetry, re-evaluation of patient's condition and participation in multidisciplinary rounds.  Kipp Brood, MD [facility] ICU Physician [place]  Pager: [phone] Mobile: [phone]  After hours: [phone].    01/01/2020, 5:56 PM

## 2020-01-08 NOTE — Progress Notes (Signed)
EVENTS OVERNIGHT  2000- Elink called, spoke to [place]. Discussed changing CBG to q 4hr overnight. Deferred to attending & surgeon about extubation.  2030- Dr. [name] paged. Notified physician on ABG Coox results and current patient status. Order to wean to extubate if patient awake.   2045- Prexedex decreased. Pt boosted in bed. Patient became agitated. V-tach beats noted on monitor. Impella alarming suction, and improper placement.  2100Quita Palmer, Impella rep on phone. Impella zeroed. Placement waveform dampened. Motor current waveform appropriate.   2106- Dr. [name] at bedside. Assessed patient status. Doc stated- no rush to extubate if he is not stable.  2115- Issues with machine, on phone with Impella rep Corey Palmer, Cardiology Fellow Dr. Marcelle Smiling paged. Order for LR bolus.  2130- Dr. Marcelle Smiling at bedside. Verified Impella placement via [place]. V-tach episodes noted. Dr. Marcelle Smiling called Dr. Haroldine Laws, and reviewed waveforms, [place] of Impella placement and patient VS, and overall status. P level dropped to P7 due impella alarming, pt not tolerating P8, more frequent ectopy noted. Decision from physician to hold of on extubation due to instability, wants to eval in AM.   Per Dr. Haroldine Laws Goal for CVP 8-12, Art map 70-90.  2300- Updated Dr. Marcelle Smiling on pt's response to fluid bolus, CVP. Less ectopy noted on monitor. Impella stable with no alarms. New order for 250 ml bolus, call if CVP is still less than 8.  0251- New order for Albumin x2, goal for CVP 8 or greater.

## 2020-01-08 NOTE — Progress Notes (Signed)
ANTICOAGULATION CONSULT NOTE - Initial Consult  Pharmacy Consult for heparin Indication: Impella 5.5  Allergies  Allergen Reactions  . Orange Fruit Anaphylaxis, Hives and Other (See Comments)    Per Pt- Blisters around lips and Hives all over, also  . Penicillins Anaphylaxis    Did it involve swelling of the face/tongue/throat, SOB, or low BP? Yes Did it involve sudden or severe rash/hives, skin peeling, or any reaction on the inside of your mouth or nose? Yes Did you need to seek medical attention at a hospital or doctor's office? No When did it last happen?childhood If all above answers are "NO", may proceed with cephalosporin use.  [name] [Empagliflozin] Itching  . Basaglar Kwikpen [Insulin Glargine] Nausea And Vomiting    Patient Measurements: Height: 6\' 3"  (190.5 cm) Weight: 101.9 kg (224 lb 10.4 oz) IBW/kg (Calculated) : 84.5 HEPARIN DW (KG): 109.8   Vital Signs: Temp: 98.7 F (37.1 C) (12/11 0755) Temp Source: Oral (12/11 0755) BP: 140/100 (12/11 1235) Pulse Rate: 87 (12/11 1235)  Labs: Recent Labs    01/06/20 0424 01/06/20 0607 01/06/20 2215 01/07/20 0319 01/07/20 0321 01/07/2020 0425 01/26/2020 1448 01/11/2020 1452 01/10/2020 1530  HGB 14.6  --   --  15.2  --  15.8 17.3* 17.7* 16.7  HCT 46.5  --   --  47.0  --  49.2 51.0 52.0 49.0  PLT 225  --   --  275  --  354  --   --   --   APTT 45*  --   --  67*  --   --   --   --   --   HEPARINUNFRC  --    < > 0.34  --  0.35 0.45  --   --   --   CREATININE 1.77*   < >  --  2.06*  --  2.02* 1.60*  --  1.60*   < > = values in this interval not displayed.    Estimated Creatinine Clearance: 70.7 mL/min (A) (by C-G formula based on SCr of 1.6 mg/dL (H)).   Medical History: Past Medical History:  Diagnosis Date  . Anginal pain ([place])   . Anxiety   . Arthritis   . Automatic implantable cardioverter-defibrillator in situ 2007   [employer]   . Bipolar disorder ([place])   . CAD (coronary artery disease)    Cardiac  cath (08/19/2000) - Nonobstructive, 40% stenosis of LAD,.   . Cancer [facility]) 2013   benign stomach cancer  . CHF (congestive heart failure) ([place])   . Chondrocalcinosis of right knee 06/11/2013  . Chronic systolic heart failure ([place])    2D echo (85/0277) - Systolic function severely reduced., LV EF 20-25%. Akinesis of apical and anteroseptal mycoardium.  last 2D echo (11/2008 ), LV EF 25-30%, diffuse hypokinesis, and grade 1 diastolic dysfunction.  . Depression   . Diabetes uncomplicated adult-type II    on insulin therapy, a1c 12.9 in 01/2011  . Dilated cardiomyopathy ([place])    status post ICD placement in 2008 c/b tearing at the atrial junction resulting in tamponade and urgent thoracotomy  . Dyslipidemia   . Dysrhythmia   . GERD (gastroesophageal reflux disease)   . Gout, unspecified   . Hepatic steatosis 2011   seen on ultrasound.   . History of kidney stones   . Hx of echocardiogram    Echo (04/2013):  Mild LVH, EF 20-25%, mild LAE, mild to mod RVE with mild reduced RVSF.  Marland Kitchen Hypertension   .  Obesity (BMI 30.0-34.9)   . Pacemaker   . Paroxysmal atrial fibrillation ([facility])    on chronic coumadin, goal INR 2-3. status post direct current  . Peptic ulcer   . Shortness of breath   . Sleep apnea    does not have CPAP    Medications:  Medications Prior to Admission  Medication Sig Dispense Refill Last Dose  . apixaban (ELIQUIS) 5 MG TABS tablet Take 1 tablet (5 mg total) by mouth 2 (two) times daily. 180 tablet 3 01/19/2020 at 0600  . aspirin 81 MG chewable tablet Chew 324 mg by mouth as needed (for chest pain).   01/03/2020 at 1200  . busPIRone (BUSPAR) 10 MG tablet Take 1 tablet (10 mg total) by mouth 3 (three) times daily. 90 tablet 11 01/02/2020 at am  . carvedilol (COREG) 25 MG tablet Take 1 tablet (25 mg total) by mouth 2 (two) times daily. Needs appt (Patient taking differently: Take 25 mg by mouth 2 (two) times daily. ) 180 tablet 3 01/15/2020 at 0600  . dapagliflozin propanediol  (FARXIGA) 10 MG TABS tablet Take 10 mg by mouth daily before breakfast. 30 tablet 6 01/25/2020 at Unknown time  . dicyclomine (BENTYL) 10 MG capsule Take 10 mg by mouth 3 (three) times daily before meals.   01/16/2020 at Unknown time  . digoxin (LANOXIN) 0.125 MG tablet Take 1 tablet (0.125 mg total) by mouth daily. 90 tablet 3 01/21/2020 at Unknown time  . divalproex (DEPAKOTE ER) 500 MG 24 hr tablet Take 1 tablet (500 mg total) by mouth at bedtime. 90 tablet 3 12/28/2019 at pm  . FLUoxetine (PROZAC) 20 MG capsule Take 4 capsules (80 mg total) by mouth daily. (Patient taking differently: Take 40 mg by mouth 2 (two) times daily. ) 120 capsule 11 01/25/2020 at am  . furosemide (LASIX) 20 MG tablet TAKE 2 TABLETS (40 MG TOTAL) BY MOUTH IN THE MORNING AND 1 TABLET (20 MG TOTAL) EVERY EVENING. (Patient taking differently: TAKE 2 TABLETS (40 MG TOTAL) BY MOUTH IN THE MORNING AND 1 TABLET (20 MG TOTAL) EVERY EVENING) 90 tablet 1 12/30/2019 at am  . hydrOXYzine (VISTARIL) 50 MG capsule Take 1 capsule (50 mg total) by mouth 3 (three) times daily as needed for anxiety. 90 capsule 11 unk  . insulin aspart (NOVOLOG FLEXPEN) 100 UNIT/ML FlexPen Inject 5-50 Units into the skin See admin instructions. Inject 5-50 units into the skin three times a day before meals, PER SLIDING SCALE   01/01/2020 at am  . magnesium oxide (MAG-OX) 400 MG tablet Take 800 mg by mouth daily.    01/02/2020 at am  . omeprazole (PRILOSEC) 40 MG capsule Take 1 capsule (40 mg total) by mouth daily. (Patient taking differently: Take 40 mg by mouth 2 (two) times daily before a meal. ) 30 capsule 3 01/06/2020 at am  . ondansetron (ZOFRAN) 4 MG tablet Take 4 mg by mouth every 8 (eight) hours as needed for nausea or vomiting.   unk  . POTASSIUM PO Take 4 tablets by mouth in the morning.   01/27/2020 at am  . promethazine (PHENERGAN) 25 MG tablet Take 25 mg by mouth every 8 (eight) hours as needed for nausea or vomiting.    unk  . sacubitril-valsartan  (ENTRESTO) 49-51 MG Take 1 tablet by mouth 2 (two) times daily. 60 tablet 6 01/07/2020 at am  . Semaglutide,0.25 or 0.5MG /DOS, (OZEMPIC, 0.25 OR 0.5 MG/DOSE,) 2 MG/1.5ML SOPN Inject 0.5 mg into the skin once a week. (  Patient taking differently: Inject 0.5 mg into the skin every Sunday. ) 4.5 mL 3 12/26/2019 at Unknown time  . spironolactone (ALDACTONE) 25 MG tablet Take 1 tablet (25 mg total) by mouth daily. Needs appt (Patient taking differently: Take 25 mg by mouth daily. ) 90 tablet 3 01/23/2020 at Unknown time  . glucose blood (CONTOUR TEST) test strip 1 each by Other route 2 (two) times daily. And lancets 1/day 180 each 3   . insulin glargine (LANTUS SOLOSTAR) 100 UNIT/ML Solostar Pen Inject 40 Units into the skin every morning. 15 mL PRN 10/2019 at See note   Scheduled:  . sodium chloride   Intravenous Once  . atorvastatin  80 mg Oral Daily  . busPIRone  10 mg Oral TID  . Chlorhexidine Gluconate Cloth  6 each Topical Daily  . colchicine  0.6 mg Oral Daily  . dapagliflozin propanediol  10 mg Oral Daily  . dicyclomine  10 mg Oral TID AC  . digoxin  0.125 mg Oral Daily  . divalproex  500 mg Oral QHS  . FLUoxetine  40 mg Oral BID  . insulin aspart  0-15 Units Subcutaneous TID WC  . insulin aspart  0-5 Units Subcutaneous QHS  . insulin aspart  3 Units Subcutaneous TID AC  . insulin glargine  10 Units Subcutaneous Daily  . magnesium oxide  200 mg Oral Daily  . midazolam      . mupirocin ointment   Nasal BID  . potassium chloride  40 mEq Oral Daily  . sodium chloride flush  10-40 mL Intracatheter Q12H  . sodium chloride flush  3 mL Intravenous Q12H  . spironolactone  25 mg Oral Daily    Assessment: 49 yo male s/p impella 5.5 placement. He is heparin through the purge pump at 9.8 ml/hr (490 units/hr)   Goal of Therapy:  Heparin level 0.2-0.5 units/ml Monitor platelets by anticoagulation protocol: Yes   Plan:   -Check a heparin level in 12 hours -Will follow with MD for start of  systemic heparin on 12/12  Hildred Laser, PharmD Clinical Pharmacist **Pharmacist phone directory can now be found on [email or web address] ([email or web address]).  Listed under [place].

## 2020-01-08 NOTE — Progress Notes (Signed)
  Echocardiogram Echocardiogram Transesophageal has been performed.  Corey Palmer 01/11/2020, 4:11 PM

## 2020-01-08 NOTE — Plan of Care (Signed)
  Problem: Cardiac: Goal: Ability to achieve and maintain adequate cardiopulmonary perfusion will improve Outcome: Progressing   Problem: Education: Goal: Knowledge of General Education information will improve Description: Including pain rating scale, medication(s)/side effects and non-pharmacologic comfort measures Outcome: Progressing   Problem: Clinical Measurements: Goal: Diagnostic test results will improve Outcome: Progressing Goal: Respiratory complications will improve Outcome: Progressing

## 2020-01-08 NOTE — Progress Notes (Signed)
Pre-CABG study completed.   Please see CV Proc for preliminary results.   Jaylie Neaves, RDMS  

## 2020-01-08 NOTE — Anesthesia Preprocedure Evaluation (Addendum)
Anesthesia Evaluation  Patient identified by MRN, date of birth, ID band Patient awake    Airway Mallampati: II  TM Distance: >3 FB Neck ROM: Full    Dental  (+) Teeth Intact   Pulmonary sleep apnea and Continuous Positive Airway Pressure Ventilation ,    Pulmonary exam normal        Cardiovascular hypertension, + CAD and +CHF  + dysrhythmias Atrial Fibrillation + pacemaker + Cardiac Defibrillator  Rhythm:Regular Rate:Normal  AICD implant 2007   Neuro/Psych Anxiety Depression Bipolar Disorder    GI/Hepatic Neg liver ROS, GERD  Medicated,  Endo/Other  diabetes, Type 2, Insulin Dependent  Renal/GU negative Renal ROS  negative genitourinary   Musculoskeletal  (+) Arthritis ,   Abdominal (+)  Abdomen: soft. Bowel sounds: normal.  Peds  Hematology negative hematology ROS (+)   Anesthesia Other Findings   Reproductive/Obstetrics                           Anesthesia Physical Anesthesia Plan  ASA: IV  Anesthesia Plan: General   Post-op Pain Management:    Induction: Intravenous  PONV Risk Score and Plan: 2 and Ondansetron, Dexamethasone, Treatment may vary due to age or medical condition and Midazolam  Airway Management Planned: Mask and Oral ETT  Additional Equipment: Arterial line, TEE, 3D TEE, Ultrasound Guidance Line Placement, CVP and PA Cath  Intra-op Plan:   Post-operative Plan: Post-operative intubation/ventilation  Informed Consent: I have reviewed the patients History and Physical, chart, labs and discussed the procedure including the risks, benefits and alternatives for the proposed anesthesia with the patient or authorized representative who has indicated his/her understanding and acceptance.     Dental advisory given  Plan Discussed with: CRNA  Anesthesia Plan Comments: (ECHO 01/07/20: 1. No LV thrombus. Left ventricular ejection fraction, by estimation, is  <20%.  The left ventricle has severely decreased function. The left  ventricle demonstrates global hypokinesis. The left ventricular internal  cavity size was moderately dilated. Left  ventricular diastolic parameters are consistent with Grade III diastolic  dysfunction (restrictive).  2. Right ventricular systolic function is mildly reduced. The right  ventricular size is moderately enlarged. There is moderately elevated  pulmonary artery systolic pressure. The estimated right ventricular  systolic pressure is 48.8 mmHg.  3. Left atrial size was severely dilated.  4. Right atrial size was severely dilated.  5. The mitral valve is normal in structure. Mild to moderate mitral valve  regurgitation. No evidence of mitral stenosis.  6. The aortic valve is normal in structure. Aortic valve regurgitation is  not visualized. No aortic stenosis is present.  7. The inferior vena cava is dilated in size with <50% respiratory  variability, suggesting right atrial pressure of 15 mmHg.   Cath 01/12/2020: Left Main 30% distal left main stenosis. Left Anterior Descending 40% ostial LAD stenosis. 90% stenosis proximal LAD just distal to D1 take-off. Large D1 with 99% proximal stenosis, 60% mid vessel stenosis. Left Circumflex Long up to 70% proximal LCx stenosis. Right Coronary Artery 95% mid RCA stenosis. 75% distal RCA stenosis. 60% stenosis proximal PLV. 99% stenosis mid PDA.  )       Anesthesia Quick Evaluation

## 2020-01-09 ENCOUNTER — Inpatient Hospital Stay (HOSPITAL_COMMUNITY): Payer: BC Managed Care – PPO

## 2020-01-09 ENCOUNTER — Inpatient Hospital Stay (HOSPITAL_COMMUNITY): Payer: BC Managed Care – PPO | Admitting: Anesthesiology

## 2020-01-09 ENCOUNTER — Encounter (HOSPITAL_COMMUNITY): Admission: EM | Disposition: E | Payer: Self-pay | Source: Home / Self Care | Attending: Cardiothoracic Surgery

## 2020-01-09 DIAGNOSIS — I5022 Chronic systolic (congestive) heart failure: Secondary | ICD-10-CM | POA: Diagnosis not present

## 2020-01-09 DIAGNOSIS — I2510 Atherosclerotic heart disease of native coronary artery without angina pectoris: Secondary | ICD-10-CM | POA: Diagnosis not present

## 2020-01-09 DIAGNOSIS — Z95811 Presence of heart assist device: Secondary | ICD-10-CM

## 2020-01-09 HISTORY — PX: PLACEMENT OF IMPELLA LEFT VENTRICULAR ASSIST DEVICE: SHX6519

## 2020-01-09 LAB — HEPARIN LEVEL (UNFRACTIONATED)
Heparin Unfractionated: <0.1 IU/mL — ABNORMAL LOW (ref 0.30–0.70)
Heparin Unfractionated: <0.1 IU/mL — ABNORMAL LOW (ref 0.30–0.70)
Heparin Unfractionated: 0.16 IU/mL — ABNORMAL LOW (ref 0.30–0.70)

## 2020-01-09 LAB — ECHO INTRAOPERATIVE TEE
AR max vel: 1.67 cm2
AR max vel: 1.89 cm2
AV Area VTI: 1.22 cm2
AV Area VTI: 1.76 cm2
AV Area mean vel: 1.59 cm2
AV Area mean vel: 1.73 cm2
AV Mean grad: 2 mmHg
AV Mean grad: 2 mmHg
AV Peak grad: 4.2 mmHg
AV Peak grad: 3 mmHg
Ao pk vel: 1.02 m/s
Ao pk vel: 0.86 m/s
Area-P 1/2: 4.31 cm2
Area-P 1/2: 5.97 cm2
Height: 75 in
Height: 75 in
MV M vel: 4.26 m/s
MV M vel: 3.77 m/s
MV Peak grad: 72.6 mmHg
MV Peak grad: 56.7 mmHg
MV Vena cont: 0.4 cm
Radius: 0.6 cm
S' Lateral: 6.94 cm
Weight: 3594.38 oz
Weight: 3689.62 oz

## 2020-01-09 LAB — GLUCOSE, CAPILLARY
Glucose-Capillary: 167 mg/dL — ABNORMAL HIGH (ref 70–99)
Glucose-Capillary: 146 mg/dL — ABNORMAL HIGH (ref 70–99)
Glucose-Capillary: 225 mg/dL — ABNORMAL HIGH (ref 70–99)
Glucose-Capillary: 207 mg/dL — ABNORMAL HIGH (ref 70–99)
Glucose-Capillary: 138 mg/dL — ABNORMAL HIGH (ref 70–99)

## 2020-01-09 LAB — POCT I-STAT, CHEM 8
BUN: 41 mg/dL — ABNORMAL HIGH (ref 6–20)
BUN: 41 mg/dL — ABNORMAL HIGH (ref 6–20)
Calcium, Ion: 1.25 mmol/L (ref 1.15–1.40)
Calcium, Ion: 1.21 mmol/L (ref 1.15–1.40)
Chloride: 100 mmol/L (ref 98–111)
Chloride: 100 mmol/L (ref 98–111)
Creatinine, Ser: 1.4 mg/dL — ABNORMAL HIGH (ref 0.61–1.24)
Creatinine, Ser: 1.4 mg/dL — ABNORMAL HIGH (ref 0.61–1.24)
Glucose, Bld: 196 mg/dL — ABNORMAL HIGH (ref 70–99)
Glucose, Bld: 236 mg/dL — ABNORMAL HIGH (ref 70–99)
HCT: 47 % (ref 39.0–52.0)
HCT: 46 % (ref 39.0–52.0)
Hemoglobin: 16 g/dL (ref 13.0–17.0)
Hemoglobin: 15.6 g/dL (ref 13.0–17.0)
Potassium: 5.3 mmol/L — ABNORMAL HIGH (ref 3.5–5.1)
Potassium: 5.1 mmol/L (ref 3.5–5.1)
Sodium: 133 mmol/L — ABNORMAL LOW (ref 135–145)
Sodium: 133 mmol/L — ABNORMAL LOW (ref 135–145)
TCO2: 25 mmol/L (ref 22–32)
TCO2: 22 mmol/L (ref 22–32)

## 2020-01-09 LAB — POCT I-STAT 7, (LYTES, BLD GAS, ICA,H+H)
Acid-Base Excess: 0 mmol/L (ref 0.0–2.0)
Bicarbonate: 26.2 mmol/L (ref 20.0–28.0)
Calcium, Ion: 1.26 mmol/L (ref 1.15–1.40)
HCT: 48 % (ref 39.0–52.0)
Hemoglobin: 16.3 g/dL (ref 13.0–17.0)
O2 Saturation: 100 %
Potassium: 5.2 mmol/L — ABNORMAL HIGH (ref 3.5–5.1)
Sodium: 133 mmol/L — ABNORMAL LOW (ref 135–145)
TCO2: 28 mmol/L (ref 22–32)
pCO2 arterial: 47 mmHg (ref 32.0–48.0)
pH, Arterial: 7.355 (ref 7.350–7.450)
pO2, Arterial: 258 mmHg — ABNORMAL HIGH (ref 83.0–108.0)

## 2020-01-09 LAB — CBC
HCT: 41.5 % (ref 39.0–52.0)
Hemoglobin: 14.2 g/dL (ref 13.0–17.0)
MCH: 27.1 pg (ref 26.0–34.0)
MCHC: 34.2 g/dL (ref 30.0–36.0)
MCV: 79.2 fL — ABNORMAL LOW (ref 80.0–100.0)
Platelets: 266 10*3/uL (ref 150–400)
RBC: 5.24 MIL/uL (ref 4.22–5.81)
RDW: 15 % (ref 11.5–15.5)
WBC: 7.4 10*3/uL (ref 4.0–10.5)
nRBC: 0 % (ref 0.0–0.2)

## 2020-01-09 LAB — MAGNESIUM: Magnesium: 1.7 mg/dL (ref 1.7–2.4)

## 2020-01-09 LAB — COOXEMETRY PANEL
Carboxyhemoglobin: 1.1 % (ref 0.5–1.5)
Methemoglobin: 0.8 % (ref 0.0–1.5)
O2 Saturation: 60.3 %
Total hemoglobin: 14.5 g/dL (ref 12.0–16.0)

## 2020-01-09 LAB — BASIC METABOLIC PANEL
Anion gap: 10 (ref 5–15)
BUN: 42 mg/dL — ABNORMAL HIGH (ref 6–20)
CO2: 24 mmol/L (ref 22–32)
Calcium: 8.8 mg/dL — ABNORMAL LOW (ref 8.9–10.3)
Chloride: 98 mmol/L (ref 98–111)
Creatinine, Ser: 1.55 mg/dL — ABNORMAL HIGH (ref 0.61–1.24)
GFR, Estimated: 54 mL/min — ABNORMAL LOW (ref >60)
Glucose, Bld: 150 mg/dL — ABNORMAL HIGH (ref 70–99)
Potassium: 3.6 mmol/L (ref 3.5–5.1)
Sodium: 132 mmol/L — ABNORMAL LOW (ref 135–145)

## 2020-01-09 LAB — ECHOCARDIOGRAM LIMITED
Height: 75 in
Weight: 3689.62 oz

## 2020-01-09 LAB — LACTATE DEHYDROGENASE: LDH: 206 U/L — ABNORMAL HIGH (ref 98–192)

## 2020-01-09 SURGERY — INSERTION, CARDIAC ASSIST DEVICE, IMPELLA
Anesthesia: General

## 2020-01-09 MED ORDER — HEPARIN SODIUM (PORCINE) 5000 UNIT/ML IJ SOLN
INTRAVENOUS | Status: DC
  Administered 2020-01-09: 12:00:00 2500 [IU]/h
  Filled 2020-01-09: qty 10

## 2020-01-09 MED ORDER — ROCURONIUM BROMIDE 10 MG/ML (PF) SYRINGE
PREFILLED_SYRINGE | INTRAVENOUS | Status: DC | PRN
  Administered 2020-01-09 (×3): 50 mg via INTRAVENOUS
  Administered 2020-01-09: 30 mg via INTRAVENOUS

## 2020-01-09 MED ORDER — MIDAZOLAM HCL 5 MG/5ML IJ SOLN
INTRAMUSCULAR | Status: DC | PRN
  Administered 2020-01-09: 4 mg via INTRAVENOUS
  Administered 2020-01-09: 2 mg via INTRAVENOUS

## 2020-01-09 MED ORDER — SODIUM CHLORIDE 0.9 % IV SOLN
INTRAVENOUS | Status: AC
  Filled 2020-01-09: qty 1.2

## 2020-01-09 MED ORDER — 0.9 % SODIUM CHLORIDE (POUR BTL) OPTIME
TOPICAL | Status: DC | PRN
  Administered 2020-01-09: 12:00:00 2000 mL

## 2020-01-09 MED ORDER — NOREPINEPHRINE 4 MG/250ML-% IV SOLN
INTRAVENOUS | Status: DC | PRN
  Administered 2020-01-09: 2 ug/min via INTRAVENOUS

## 2020-01-09 MED ORDER — VANCOMYCIN HCL 1000 MG IV SOLR
INTRAVENOUS | Status: DC | PRN
  Administered 2020-01-09: 11:00:00 1000 mg via INTRAVENOUS

## 2020-01-09 MED ORDER — HEPARIN SODIUM (PORCINE) 5000 UNIT/ML IJ SOLN
INTRAVENOUS | Status: DC
  Filled 2020-01-09 (×2): qty 10

## 2020-01-09 MED ORDER — LACTATED RINGERS IV SOLN: INTRAVENOUS | Status: DC | PRN

## 2020-01-09 MED ORDER — HEPARIN SODIUM (PORCINE) 1000 UNIT/ML IJ SOLN
INTRAMUSCULAR | Status: DC | PRN
  Administered 2020-01-09: 10000 [IU] via INTRAVENOUS

## 2020-01-09 MED ORDER — PHENYLEPHRINE 40 MCG/ML (10ML) SYRINGE FOR IV PUSH (FOR BLOOD PRESSURE SUPPORT)
PREFILLED_SYRINGE | INTRAVENOUS | Status: AC
  Filled 2020-01-09: qty 10

## 2020-01-09 MED ORDER — ALBUMIN HUMAN 5 % IV SOLN
INTRAVENOUS | Status: AC
  Administered 2020-01-09: 03:00:00 12.5 g via INTRAVENOUS
  Filled 2020-01-09: qty 250

## 2020-01-09 MED ORDER — ROCURONIUM BROMIDE 10 MG/ML (PF) SYRINGE
PREFILLED_SYRINGE | INTRAVENOUS | Status: AC
  Filled 2020-01-09: qty 20

## 2020-01-09 MED ORDER — NICARDIPINE HCL IN NACL 20-0.86 MG/200ML-% IV SOLN
3.0000 mg/h | INTRAVENOUS | Status: DC
  Filled 2020-01-09: qty 200

## 2020-01-09 MED ORDER — MIDAZOLAM HCL 2 MG/2ML IJ SOLN
INTRAMUSCULAR | Status: AC
  Filled 2020-01-09: qty 2

## 2020-01-09 MED ORDER — SODIUM CHLORIDE 0.9 % IV SOLN
INTRAVENOUS | Status: DC | PRN
  Administered 2020-01-09: 12:00:00 500 mL

## 2020-01-09 MED ORDER — ALBUMIN HUMAN 5 % IV SOLN
12.5000 g | Freq: Two times a day (BID) | INTRAVENOUS | Status: DC | PRN
  Administered 2020-01-09: 03:00:00 12.5 g via INTRAVENOUS
  Filled 2020-01-09: qty 250

## 2020-01-09 MED ORDER — HEPARIN (PORCINE) 25000 UT/250ML-% IV SOLN
850.0000 [IU]/h | INTRAVENOUS | Status: DC
  Administered 2020-01-09: 01:00:00 200 [IU]/h via INTRAVENOUS
  Administered 2020-01-10: 20:00:00 800 [IU]/h via INTRAVENOUS
  Administered 2020-01-11: 23:00:00 850 [IU]/h via INTRAVENOUS
  Filled 2020-01-09 (×3): qty 250

## 2020-01-09 MED ORDER — ORAL CARE MOUTH RINSE
15.0000 mL | Freq: Two times a day (BID) | OROMUCOSAL | Status: DC
  Administered 2020-01-09 – 2020-01-11 (×3): 15 mL via OROMUCOSAL

## 2020-01-09 MED ORDER — SODIUM CHLORIDE 0.9 % IV SOLN: INTRAVENOUS | Status: DC | PRN

## 2020-01-09 MED ORDER — VANCOMYCIN HCL IN DEXTROSE 1-5 GM/200ML-% IV SOLN
INTRAVENOUS | Status: AC
  Filled 2020-01-09: qty 200

## 2020-01-09 MED ORDER — SODIUM CHLORIDE 0.9% IV SOLUTION: INTRAVENOUS | Status: DC | PRN

## 2020-01-09 MED ORDER — FENTANYL CITRATE (PF) 250 MCG/5ML IJ SOLN
INTRAMUSCULAR | Status: AC
  Filled 2020-01-09: qty 5

## 2020-01-09 MED ORDER — POTASSIUM CHLORIDE 10 MEQ/50ML IV SOLN
10.0000 meq | INTRAVENOUS | Status: AC
  Administered 2020-01-09 (×3): 10 meq via INTRAVENOUS
  Filled 2020-01-09 (×3): qty 50

## 2020-01-09 MED ORDER — MIDAZOLAM HCL 2 MG/2ML IJ SOLN
INTRAMUSCULAR | Status: AC
  Filled 2020-01-09: qty 4

## 2020-01-09 MED ORDER — ORAL CARE MOUTH RINSE: 15.0000 mL | Freq: Two times a day (BID) | OROMUCOSAL | Status: DC

## 2020-01-09 SURGICAL SUPPLY — 56 items
ANCHOR CATH FOLEY SECURE (MISCELLANEOUS) ×6 IMPLANT
APL SRG 7X2 LUM MLBL SLNT (VASCULAR PRODUCTS)
APPLICATOR TIP COSEAL (VASCULAR PRODUCTS) IMPLANT
BLADE CLIPPER SURG (BLADE) IMPLANT
CATH ACCU-VU SIZ PIG 5F 100CM (CATHETERS) ×2 IMPLANT
CATH DIAG EXPO 6F AL1 (CATHETERS) IMPLANT
CATH INFINITI 6F MPB2 (CATHETERS) IMPLANT
CLIP LIGATING EXTRA MED SLVR (CLIP) IMPLANT
CLIP VESOCCLUDE MED 24/CT (CLIP) ×2 IMPLANT
CLIP VESOCCLUDE SM WIDE 24/CT (CLIP) ×2 IMPLANT
DRAPE C-ARM 42X72 X-RAY (DRAPES) ×2 IMPLANT
DRAPE CV SPLIT W-CLR ANES SCRN (DRAPES) ×2 IMPLANT
DRAPE PERI GROIN 82X75IN TIB (DRAPES) ×2 IMPLANT
DRAPE SLUSH/WARMER DISC (DRAPES) ×2 IMPLANT
DRSG TEGADERM 4X4.5 CHG (GAUZE/BANDAGES/DRESSINGS) ×4 IMPLANT
ELECT BLADE 4.0 EZ CLEAN MEGAD (MISCELLANEOUS) ×2
ELECTRODE BLDE 4.0 EZ CLN MEGD (MISCELLANEOUS) ×1 IMPLANT
FELT TEFLON 1X6 (MISCELLANEOUS) ×2 IMPLANT
GAUZE SPONGE 4X4 12PLY STRL LF (GAUZE/BANDAGES/DRESSINGS) ×2 IMPLANT
GLOVE BIO SURGEON STRL SZ 6.5 (GLOVE) ×8 IMPLANT
GLOVE NEODERM STRL 7.5  LF PF (GLOVE) ×2
GLOVE NEODERM STRL 7.5 LF PF (GLOVE) ×2 IMPLANT
GLOVE SURG NEODERM 7.5  LF PF (GLOVE) ×2
GOWN STRL REUS W/ TWL LRG LVL3 (GOWN DISPOSABLE) ×4 IMPLANT
GOWN STRL REUS W/TWL LRG LVL3 (GOWN DISPOSABLE) ×8
GRAFT HEMASHIELD 10MM (Graft) ×2 IMPLANT
GRAFT VASC STRG 30X10STRL (Graft) ×1 IMPLANT
INSERT FOGARTY SM (MISCELLANEOUS) ×2 IMPLANT
KIT BASIN OR (CUSTOM PROCEDURE TRAY) ×2 IMPLANT
KIT REMOVER STAPLE SKIN (MISCELLANEOUS) ×2 IMPLANT
LIGACLIP SM TITANIUM (CLIP) IMPLANT
LOOP VESSEL MINI RED (MISCELLANEOUS) IMPLANT
NS IRRIG 1000ML POUR BTL (IV SOLUTION) ×4 IMPLANT
PACK CHEST (CUSTOM PROCEDURE TRAY) ×2 IMPLANT
PAD ARMBOARD 7.5X6 YLW CONV (MISCELLANEOUS) ×4 IMPLANT
PAD ELECT DEFIB RADIOL ZOLL (MISCELLANEOUS) ×2 IMPLANT
PUMP SET IMPELLA 5.5 US (CATHETERS) IMPLANT
SEALANT SURG COSEAL 8ML (VASCULAR PRODUCTS) IMPLANT
STAPLER VISISTAT 35W (STAPLE) ×2 IMPLANT
SUT ETHILON 3 0 PS 1 (SUTURE) IMPLANT
SUT PDS AB 1 CTX 36 (SUTURE) ×2 IMPLANT
SUT PROLENE 4 0 RB 1 (SUTURE) ×4
SUT PROLENE 4-0 RB1 .5 CRCL 36 (SUTURE) ×2 IMPLANT
SUT PROLENE 5 0 C 1 36 (SUTURE) ×6 IMPLANT
SUT SILK  1 MH (SUTURE) ×2
SUT SILK 1 MH (SUTURE) ×1 IMPLANT
SUT SILK 1 TIES 10X30 (SUTURE) ×2 IMPLANT
SUT SILK 2 0 SH CR/8 (SUTURE) ×2 IMPLANT
SUT VIC AB 2-0 CT1 27 (SUTURE) ×2
SUT VIC AB 2-0 CT1 TAPERPNT 27 (SUTURE) ×1 IMPLANT
SYR BULB IRRIG 60ML STRL (SYRINGE) ×2 IMPLANT
TAPE CLOTH SURG 4X10 WHT LF (GAUZE/BANDAGES/DRESSINGS) ×2 IMPLANT
TOWEL GREEN STERILE (TOWEL DISPOSABLE) ×2 IMPLANT
TOWEL GREEN STERILE FF (TOWEL DISPOSABLE) IMPLANT
TRAY FOLEY SLVR 16FR TEMP STAT (SET/KITS/TRAYS/PACK) IMPLANT
WATER STERILE IRR 1000ML POUR (IV SOLUTION) ×2 IMPLANT

## 2020-01-09 NOTE — Procedures (Signed)
Extubation Procedure Note  Patient Details:   Name: ARTHUR SPEAGLE DOB: 11-Nov-1970 MRN: [id number]   Airway Documentation:    Vent end date: 12/30/2019 Vent end time: 1737   Evaluation  O2 sats: stable throughout Complications: No apparent complications Patient did tolerate procedure well. Bilateral Breath Sounds: Clear,Diminished   Yes  Yisroel Mullendore 01/22/2020, 5:39 PM

## 2020-01-09 NOTE — H&P (Signed)
History and Physical Interval Note:  01/13/2020 10:39 AM  Corey Palmer  has presented today for surgery, with the diagnosis of HEART FAILURE.  The various methods of treatment have been discussed with the patient and family. After consideration of risks, benefits and other options for treatment, the patient has consented to  Procedure(s): [place]  5.5 LEFT VENTRICULAR ASSIST DEVICE (Right) as a surgical intervention.  [name] history has been reviewed, patient examined, no change in status, stable for surgery.  I have reviewed the patient's chart and labs.  Questions were answered to the patient's satisfaction.     [name]

## 2020-01-09 NOTE — Anesthesia Postprocedure Evaluation (Signed)
Anesthesia Post Note  Patient: Corey Palmer  Procedure(s) Performed: PLACEMENT OF IMPELLA LEFT VENTRICULAR ASSIST DEVICE (N/A )     Patient location during evaluation: SICU Anesthesia Type: General Level of consciousness: sedated Pain management: pain level controlled Vital Signs Assessment: post-procedure vital signs reviewed and stable Respiratory status: patient remains intubated per anesthesia plan Cardiovascular status: stable Postop Assessment: no apparent nausea or vomiting Anesthetic complications: no   No complications documented.  Last Vitals:  Vitals:   01/01/2020 1745 12/31/2019 1800  BP:  (!) 86/71  Pulse: (!) 55 (!) 56  Resp: (!) 26 (!) 23  Temp: 36.8 C 36.8 C  SpO2: 98% 99%    Last Pain:  Vitals:   01/08/2020 1600  TempSrc: Core  PainSc:                  March Rummage Jazzmyne Rasnick

## 2020-01-09 NOTE — Anesthesia Procedure Notes (Signed)
Central Venous Catheter Insertion Performed by: Darral Dash, DO, anesthesiologist Start/End12/12/2019 1:00 PM, 01/04/2020 1:10 PM Patient location: Pre-op. Preanesthetic checklist: patient identified, IV checked, site marked, risks and benefits discussed, surgical consent, monitors and equipment checked, pre-op evaluation, timeout performed and anesthesia consent Position: Trendelenburg Lidocaine 1% used for infiltration and patient sedated Hand hygiene performed  and maximum sterile barriers used  Catheter size: 9 Fr Central line was placed.MAC introducer Procedure performed using ultrasound guided technique. Ultrasound Notes:anatomy identified, needle tip was noted to be adjacent to the nerve/plexus identified, no ultrasound evidence of intravascular and/or intraneural injection and image(s) printed for medical record Attempts: 1 Following insertion, line sutured, dressing applied and Biopatch. Post procedure assessment: blood return through all ports, free fluid flow and no air  Patient tolerated the procedure well with no immediate complications.

## 2020-01-09 NOTE — Anesthesia Preprocedure Evaluation (Signed)
Anesthesia Evaluation  Patient identified by MRN, date of birth, ID band  Reviewed: Patient's Chart, lab work & pertinent test results  Airway Mallampati: Intubated       Dental  (+) Teeth Intact   Pulmonary sleep apnea and Continuous Positive Airway Pressure Ventilation ,    Pulmonary exam normal        Cardiovascular hypertension, + CAD and +CHF  + dysrhythmias Atrial Fibrillation + pacemaker + Cardiac Defibrillator  Rhythm:Regular Rate:Normal  AICD implant 2007   Neuro/Psych Anxiety Depression Bipolar Disorder    GI/Hepatic Neg liver ROS, GERD  Medicated,  Endo/Other  diabetes, Type 2, Insulin Dependent  Renal/GU negative Renal ROS  negative genitourinary   Musculoskeletal  (+) Arthritis ,   Abdominal (+)  Abdomen: soft. Bowel sounds: normal.  Peds  Hematology negative hematology ROS (+)   Anesthesia Other Findings   Reproductive/Obstetrics                             Anesthesia Physical  Anesthesia Plan  ASA: IV  Anesthesia Plan: General   Post-op Pain Management:    Induction: Intravenous  PONV Risk Score and Plan: 2 and Ondansetron, Dexamethasone, Treatment may vary due to age or medical condition and Midazolam  Airway Management Planned: Mask and Oral ETT  Additional Equipment: Arterial line, TEE, 3D TEE, CVP and PA Cath  Intra-op Plan:   Post-operative Plan: Post-operative intubation/ventilation  Informed Consent: I have reviewed the patients History and Physical, chart, labs and discussed the procedure including the risks, benefits and alternatives for the proposed anesthesia with the patient or authorized representative who has indicated his/her understanding and acceptance.     Dental advisory given  Plan Discussed with: CRNA  Anesthesia Plan Comments: (ECHO 01/07/20: 1. No LV thrombus. Left ventricular ejection fraction, by estimation, is  <20%. The left  ventricle has severely decreased function. The left  ventricle demonstrates global hypokinesis. The left ventricular internal  cavity size was moderately dilated. Left  ventricular diastolic parameters are consistent with Grade III diastolic  dysfunction (restrictive).  2. Right ventricular systolic function is mildly reduced. The right  ventricular size is moderately enlarged. There is moderately elevated  pulmonary artery systolic pressure. The estimated right ventricular  systolic pressure is 53.7 mmHg.  3. Left atrial size was severely dilated.  4. Right atrial size was severely dilated.  5. The mitral valve is normal in structure. Mild to moderate mitral valve  regurgitation. No evidence of mitral stenosis.  6. The aortic valve is normal in structure. Aortic valve regurgitation is  not visualized. No aortic stenosis is present.  7. The inferior vena cava is dilated in size with <50% respiratory  variability, suggesting right atrial pressure of 15 mmHg.   Cath 01/25/2020: Left Main 30% distal left main stenosis. Left Anterior Descending 40% ostial LAD stenosis. 90% stenosis proximal LAD just distal to D1 take-off. Large D1 with 99% proximal stenosis, 60% mid vessel stenosis. Left Circumflex Long up to 70% proximal LCx stenosis. Right Coronary Artery 95% mid RCA stenosis. 75% distal RCA stenosis. 60% stenosis proximal PLV. 99% stenosis mid PDA.  )        Anesthesia Quick Evaluation

## 2020-01-09 NOTE — Progress Notes (Signed)
NAME:  Corey Palmer, MRN:  [id number], DOB:  1970/07/26, LOS: 28 ADMISSION DATE:  01/25/2020, CONSULTATION DATE: 01/10/2020 REFERRING MD: Aundra Dubin, CHIEF COMPLAINT: Respiratory failure post Impella  HPI/course in hospital  49 year old man underwent Impella 5.5 insertion today for persistent symptoms of forward failure with low cardiac indices.  He presented last week for ICD shocks and was found to be in rapid atrial fibrillation for which he was cardioverted.  He was also found to be in decompensated heart failure with an ejection fraction of 15% and multivessel coronary disease on left heart catheterization.  He was started on tropes and diuresed which improved his congestive symptoms but his index remained low so an Impella was placed to optimize his hemodynamics prior to a high risk redo-bypass.  Uneventful Impella implantation.  Patient returned from operating room intubated  Past Medical History   Past Medical History:  Diagnosis Date  . Anginal pain ([place])   . Anxiety   . Arthritis   . Automatic implantable cardioverter-defibrillator in situ 2007   [employer]   . Bipolar disorder ([place])   . CAD (coronary artery disease)    Cardiac cath (08/19/2000) - Nonobstructive, 40% stenosis of LAD,.   . Cancer [facility]) 2013   benign stomach cancer  . CHF (congestive heart failure) ([place])   . Chondrocalcinosis of right knee 06/11/2013  . Chronic systolic heart failure ([place])    2D echo (69/4503) - Systolic function severely reduced., LV EF 20-25%. Akinesis of apical and anteroseptal mycoardium.  last 2D echo (11/2008 ), LV EF 25-30%, diffuse hypokinesis, and grade 1 diastolic dysfunction.  . Depression   . Diabetes uncomplicated adult-type II    on insulin therapy, a1c 12.9 in 01/2011  . Dilated cardiomyopathy ([place])    status post ICD placement in 2008 c/b tearing at the atrial junction resulting in tamponade and urgent thoracotomy  . Dyslipidemia   . Dysrhythmia   . GERD (gastroesophageal  reflux disease)   . Gout, unspecified   . Hepatic steatosis 2011   seen on ultrasound.   . History of kidney stones   . Hx of echocardiogram    Echo (04/2013):  Mild LVH, EF 20-25%, mild LAE, mild to mod RVE with mild reduced RVSF.  Marland Kitchen Hypertension   . Obesity (BMI 30.0-34.9)   . Pacemaker   . Paroxysmal atrial fibrillation (H[facility])    on chronic coumadin, goal INR 2-3. status post direct current  . Peptic ulcer   . Shortness of breath   . Sleep apnea    does not have CPAP     Past Surgical History:  Procedure Laterality Date  . BIOPSY  12/01/2019   Procedure: BIOPSY;  Surgeon: Arta Silence, MD;  Location: [facility] ENDOSCOPY;  Service: Endoscopy;;  . CARDIAC CATHETERIZATION    . CARDIAC PACEMAKER PLACEMENT    . CHOLECYSTECTOMY    . COLONOSCOPY    . CORONARY ARTERY BYPASS GRAFT    . ESOPHAGOGASTRODUODENOSCOPY  04/05/2011   Procedure: ESOPHAGOGASTRODUODENOSCOPY (EGD);  Surgeon: Irene Shipper, MD;  Location: [facility] ENDOSCOPY;  Service: Endoscopy;  Laterality: N/A;  . ESOPHAGOGASTRODUODENOSCOPY (EGD) WITH PROPOFOL N/A 12/01/2019   Procedure: ESOPHAGOGASTRODUODENOSCOPY (EGD) WITH PROPOFOL;  Surgeon: Arta Silence, MD;  Location: [facility] ENDOSCOPY;  Service: Endoscopy;  Laterality: N/A;  . JOINT REPLACEMENT     left knee replacement  . KNEE ARTHROSCOPY Right 06/11/2013   Procedure: RIGHT KNEE ARTHROSCOPY with chondroplasty;  Surgeon: Johnny Bridge, MD;  Location: [facility];  Service: Orthopedics;  Laterality: Right;  .  RIGHT/LEFT HEART CATH AND CORONARY ANGIOGRAPHY N/A 01/28/2020   Procedure: RIGHT/LEFT HEART CATH AND CORONARY ANGIOGRAPHY;  Surgeon: Larey Dresser, MD;  Location: [facility] CV [facility];  Service: Cardiovascular;  Laterality: N/A;  . TOTAL KNEE ARTHROPLASTY Left       Interim history/subjective:  Unable to extubate last night due to recurrent suction alarms which improved with fluid administration. Frequent ventricular ectopy this morning.  Impella migrated out of ventricle during attempt  to reposition.  Patient was brought to the OR for replacement.  Objective   Blood pressure 99/85, pulse (!) 51, temperature 98.24 F (36.8 C), resp. rate (!) 22, height 6\' 3"  (1.905 m), weight 104.6 kg, SpO2 99 %. PAP: (22-48)/(10-22) 32/17 CVP:  [3 mmHg-15 mmHg] 7 mmHg PCWP:  [12 mmHg-13 mmHg] 12 mmHg CO:  [3.8 L/min-6.7 L/min] 3.9 L/min CI:  [1.6 L/min/m2-2.9 L/min/m2] 1.7 L/min/m2  Vent Mode: PRVC;SIMV;PSV FiO2 (%):  [40 %-50 %] 40 % Set Rate:  [12 bmp-18 bmp] 12 bmp Vt Set:  [670 mL] 670 mL PEEP:  [5 cmH20] 5 cmH20 Pressure Support:  [10 cmH20-18 cmH20] 10 cmH20 Plateau Pressure:  [18 cmH20-21 cmH20] 21 cmH20   Intake/Output Summary (Last 24 hours) at 01/10/2020 1733 Last data filed at 01/25/2020 1700 Gross per 24 hour  Intake 4548.28 ml  Output 2815 ml  Net 1733.28 ml   Filed Weights   01/07/20 0500 01/13/2020 0600 12/30/2019 0400  Weight: 102.4 kg 101.9 kg 104.6 kg   PAP: (22-48)/(10-22) 32/17 CVP:  [3 mmHg-15 mmHg] 7 mmHg PCWP:  [12 mmHg-13 mmHg] 12 mmHg CO:  [3.8 L/min-6.7 L/min] 3.9 L/min CI:  [1.6 L/min/m2-2.9 L/min/m2] 1.7 L/min/m2  Examination: Physical Exam Constitutional:      Appearance: Normal appearance. He is overweight.     Interventions: He is sedated and intubated.  HENT:     Head:     Comments: Orally intubated OG tube in place Eyes:     Conjunctiva/sclera: Conjunctivae normal.  Cardiovascular:     Rate and Rhythm: Normal rate and regular rhythm.     Heart sounds: Heart sounds are distant. No murmur heard. No friction rub.     Comments: Bilateral Unna boots. Pulmonary:     Effort: He is intubated.  Tolerating SBT chest is clear to auscultation Chest:     Comments: Healed median sternotomy.  Right subclavian Impella Abdominal:     General: Abdomen is flat.     Palpations: Abdomen is soft.  Genitourinary:    Comments: Foley catheter in place with clear urine Skin:    General: Skin is warm.     Capillary Refill: Capillary refill takes 2 to 3  seconds.  Neurological:     Mental Status: He is awake and following commands    Ancillary tests (personally reviewed)  CBC: Recent Labs  Lab 01/18/2020 0411 01/05/20 1358 01/06/20 0424 01/07/20 0319 01/27/2020 0425 01/11/2020 1448 12/30/2019 1937 01/17/2020 0428 01/28/2020 1127 12/29/2019 1130 01/08/2020 1149  WBC 10.9*  --  7.4 8.8 10.3  --   --  7.4  --   --   --   HGB 14.6   < > 14.6 15.2 15.8   < > 16.3 14.2 16.0 16.3 15.6  HCT 45.7   < > 46.5 47.0 49.2   < > 48.0 41.5 47.0 48.0 46.0  MCV 82.0  --  83.6 80.8 80.8  --   --  79.2*  --   --   --   PLT 246  --  225 275 354  --   --  266  --   --   --    < > = values in this interval not displayed.    Basic Metabolic Panel: Recent Labs  Lab 01/04/2020 0411 01/21/2020 1358 01/06/20 0424 01/06/20 1657 01/07/20 0319 01/23/2020 0425 01/02/2020 1448 01/25/2020 1452 01/11/2020 1530 01/23/2020 1937 01/08/2020 0428 01/19/2020 0831 01/21/2020 1127 01/27/2020 1130 01/10/2020 1149  NA 128*   < > 132* 130* 132* 133* 135   < > 135 133* 132*  --  133* 133* 133*  K 4.5   < > 3.0* 3.9 3.8 4.1 3.8   < > 3.9 3.9 3.6  --  5.3* 5.2* 5.1  CL 81*  --  80* 80* 82* 90* 95*  --  94*  --  98  --  100  --  100  CO2 30  --  35* 35* 35* 32  --   --   --   --  24  --   --   --   --   GLUCOSE 426*  --  312* 338* 229* 207* 176*  --  183*  --  150*  --  196*  --  236*  BUN 44*  --  40* 42* 47* 54* 52*  --  51*  --  42*  --  41*  --  41*  CREATININE 1.83*  --  1.77* 2.03* 2.06* 2.02* 1.60*  --  1.60*  --  1.55*  --  1.40*  --  1.40*  CALCIUM 8.6*  --  8.7* 9.0 8.8* 9.3  --   --   --   --  8.8*  --   --   --   --   MG 2.2  --  1.9  --  2.1 2.2  --   --   --   --   --  1.7  --   --   --    < > = values in this interval not displayed.   GFR: Estimated Creatinine Clearance: 81.7 mL/min (A) (by C-G formula based on SCr of 1.4 mg/dL (H)). Recent Labs  Lab 01/06/20 0424 01/07/20 0319 01/12/2020 0425 01/11/2020 0428  WBC 7.4 8.8 10.3 7.4    Liver Function Tests: No results for  input(s): AST, ALT, ALKPHOS, BILITOT, PROT, ALBUMIN in the last 168 hours. No results for input(s): LIPASE, AMYLASE in the last 168 hours. No results for input(s): AMMONIA in the last 168 hours.  ABG    Component Value Date/Time   PHART 7.355 01/22/2020 1130   PCO2ART 47.0 01/24/2020 1130   PO2ART 258 (H) 01/07/2020 1130   HCO3 26.2 01/14/2020 1130   TCO2 22 12/30/2019 1149   ACIDBASEDEF 5.4 (H) 03/09/2008 0400   O2SAT 100.0 01/08/2020 1130     Coagulation Profile: No results for input(s): INR, PROTIME in the last 168 hours.  Cardiac Enzymes: No results for input(s): CKTOTAL, CKMB, CKMBINDEX, TROPONINI in the last 168 hours.  HbA1C: Hemoglobin A1C  Date/Time Value Ref Range Status  12/01/2019 04:04 PM 10.8 (A) 4.0 - 5.6 % Final  02/03/2019 04:24 PM 12.5 (A) 4.0 - 5.6 % Final   Hgb A1c MFr Bld  Date/Time Value Ref Range Status  01/03/2020 03:41 PM 10.6 (H) 4.8 - 5.6 % Final    Comment:    (NOTE) Pre diabetes:          5.7%-6.4%  Diabetes:              >  6.4%  Glycemic control for   <7.0% adults with diabetes   10/01/2018 04:02 PM 11.9 (H) 4.8 - 5.6 % Final    Comment:    (NOTE) Pre diabetes:          5.7%-6.4% Diabetes:              >6.4% Glycemic control for   <7.0% adults with diabetes     CBG: Recent Labs  Lab 01/25/2020 1936 01/22/2020 2256 01/07/2020 0211 01/13/2020 0620 01/18/2020 1348  GLUCAP 226* 198* 167* 146* 225*   Chest x-ray 12/11-ET tube in place, OG tube and Impella in place, no pneumothorax, bilateral increased insertional markings consistent with pulmonary edema.  Assessment & Plan:  Critically ill due to expected acute hypoxic respiratory failure require mechanical ventilation following Impella implantation Critically ill due to acute decompensated systolic heart failure requiring mechanical cardiac support and titration of inotropes Severe coronary artery disease status post bypass Bipolar affective Type 2 diabetes with poor control Atrial  fibrillation requiring amiodarone infusion.  Acute kidney injury-improving cardiorenal syndrome  Plan:  -Proceed with extubation -Progressive ambulation starting tomorrow -Continue current hemodynamic support with milrinone and Impella at P8 -Continue nitroglycerin IV to keep MAP less than 65 -Echo suggests higher filling pressures than measured by PAC. -Follow serial SCV O2 -No diuresis today -Continue amiodarone infusion  Daily Goals Checklist  Pain/Anxiety/Delirium protocol (if indicated): As needed fentanyl, dexmedetomidine infusion VAP protocol (if indicated): Bundle in place Respiratory support goals: SIMV rapid wean protocol Blood pressure target: Keep MAP greater than 65 continue current Impella support DVT prophylaxis: SCDs for now, heparin purge Nutritional status and feeding goals: Moderate nutritional risk.  Hold feeds pending GI prophylaxis: Protonix Fluid status goals: Allow autoregulation creatinine improving with improved forward flow. Urinary catheter: Assessment of intravascular volume Central lines: Right arm PICC line, right IJ introducer with Swan, arterial line left radial Glucose control: Type 2 diabetes with adequate glycemic control on glargine and sliding scale Mobility/therapy needs: Bedrest, progressive ambulation once extubated Antibiotic de-escalation: Perioperative only Home medication reconciliation: Reordered Daily labs: CBC, BMP in a.m. Code Status: Full code Family Communication: Per cardiac surgery Disposition: ICU  CRITICAL CARE Performed by: Kipp Brood   Total critical care time: 45 minutes  Critical care time was exclusive of separately billable procedures and treating other patients.  Critical care was necessary to treat or prevent imminent or life-threatening deterioration.  Critical care was time spent personally by me on the following activities: development of treatment plan with patient and/or surrogate as well as nursing,  discussions with consultants, evaluation of patient's response to treatment, examination of patient, obtaining history from patient or surrogate, ordering and performing treatments and interventions, ordering and review of laboratory studies, ordering and review of radiographic studies, pulse oximetry, re-evaluation of patient's condition and participation in multidisciplinary rounds.  Kipp Brood, MD [facility] ICU Physician [place]  Pager: [phone] Mobile: [phone] After hours: [phone].    12/31/2019, 5:33 PM

## 2020-01-09 NOTE — Progress Notes (Addendum)
Patient ID: Corey Palmer, male   DOB: 04/05/1970, 49 y.o.   MRN: [id number]     Advanced Heart Failure Rounding Note  PCP-Cardiologist: No primary care provider on file.   Subjective:    - 12/7 Torsades -ICD shock x1.  - 12/11 Impella 5.5 placed.   After Impella placed, had suction alarms and ventricular ectopy.  Low CVP, given IV fluid and decreased to P7.  Stable this morning with no further alarms. He remains intubated, on milrinone 0.25 mcg/kg/min. Creatinine trending down, 1.55.   He remains in NSR on amiodarone gtt.   [place] numbers: CVP 9 PA 26/15 CI 1.9 Co-ox 60%  Impella 5.5 P7 Flow 3.9 No alarms this morning  Echo (12/10): EF <20%, moderate LV dilation, normal RV size with mildly decreased systolic function.   RHC/LHC: Coronary Findings   Diagnostic Dominance: Right  Left Main  30% distal left main stenosis.  Left Anterior Descending  40% ostial LAD stenosis. 90% stenosis proximal LAD just distal to D1 take-off. Large D1 with 99% proximal stenosis, 60% mid vessel stenosis.  Left Circumflex  Long up to 70% proximal LCx stenosis.  Right Coronary Artery  95% mid RCA stenosis. 75% distal RCA stenosis. 60% stenosis proximal PLV. 99% stenosis mid PDA.   Intervention   No interventions have been documented.  Right Heart  Right Heart Pressures RHC Procedural Findings: Hemodynamics (mmHg) RA mean 16 RV 53/12 PA 65/29, mean 46 PCWP mean 31 LV 119/28 AO 118/87  Oxygen saturations: PA 45% AO 93%  Cardiac Output (Fick) 3.39  Cardiac Index (Fick) 1.4 PVR 4.4 WU PAPi 2.25     Objective:   Weight Range: 104.6 kg Body mass index is 28.82 kg/m.   Vital Signs:   Temp:  [96.6 F (35.9 C)-98.8 F (37.1 C)] 98.24 F (36.8 C) (12/12 0745) Pulse Rate:  [27-90] 54 (12/12 0745) Resp:  [10-23] 11 (12/12 0745) BP: (85-145)/(67-103) 106/77 (12/12 0600) SpO2:  [87 %-100 %] 98 % (12/12 0745) Arterial Line BP: (75-107)/(72-90) 91/74 (12/12 0745) FiO2  (%):  [40 %-100 %] 40 % (12/12 0722) Weight:  [104.6 kg] 104.6 kg (12/12 0400) Last BM Date: 01/18/2020  Weight change: Filed Weights   01/07/20 0500 01/22/2020 0600 01/19/2020 0400  Weight: 102.4 kg 101.9 kg 104.6 kg    Intake/Output:   Intake/Output Summary (Last 24 hours) at 01/24/2020 0754 Last data filed at 01/10/2020 0700 Gross per 24 hour  Intake 3454.19 ml  Output 2830 ml  Net 624.19 ml      Physical Exam  CVP 9 General: Intubated Neck: No JVD, no thyromegaly or thyroid nodule.  Lungs: Mildly decreased at bases.  CV: Nondisplaced PMI.  Heart regular S1/S2, no S3/S4, no murmur.  No peripheral edema.   Abdomen: Soft, nontender, no hepatosplenomegaly, no distention.  Skin: Intact without lesions or rashes.  Neurologic: Alert and oriented x 3.  Psych: Normal affect. Extremities: No clubbing or cyanosis.  HEENT: Normal.    Telemetry    NSR 60s, personally reviewed   Labs    CBC Recent Labs    01/18/2020 0425 12/31/2019 1448 01/21/2020 1937 01/10/2020 0428  WBC 10.3  --   --  7.4  HGB 15.8   < > 16.3 14.2  HCT 49.2   < > 48.0 41.5  MCV 80.8  --   --  79.2*  PLT 354  --   --  266   < > = values in this interval not displayed.   Basic Metabolic  Panel Recent Labs    01/07/20 0319 01/03/2020 0425 12/31/2019 1448 01/21/2020 1530 01/18/2020 1937 01/26/2020 0428  NA 132* 133*   < > 135 133* 132*  K 3.8 4.1   < > 3.9 3.9 3.6  CL 82* 90*   < > 94*  --  98  CO2 35* 32  --   --   --  24  GLUCOSE 229* 207*   < > 183*  --  150*  BUN 47* 54*   < > 51*  --  42*  CREATININE 2.06* 2.02*   < > 1.60*  --  1.55*  CALCIUM 8.8* 9.3  --   --   --  8.8*  MG 2.1 2.2  --   --   --   --    < > = values in this interval not displayed.   Liver Function Tests No results for input(s): AST, ALT, ALKPHOS, BILITOT, PROT, ALBUMIN in the last 72 hours. No results for input(s): LIPASE, AMYLASE in the last 72 hours. Cardiac Enzymes No results for input(s): CKTOTAL, CKMB, CKMBINDEX, TROPONINI in  the last 72 hours.  BNP: BNP (last 3 results) Recent Labs    01/06/2020 1619  BNP 577.5*    ProBNP (last 3 results) No results for input(s): PROBNP in the last 8760 hours.   D-Dimer No results for input(s): DDIMER in the last 72 hours. Hemoglobin A1C No results for input(s): HGBA1C in the last 72 hours. Fasting Lipid Panel No results for input(s): CHOL, HDL, LDLCALC, TRIG, CHOLHDL, LDLDIRECT in the last 72 hours. Thyroid Function Tests No results for input(s): TSH, T4TOTAL, T3FREE, THYROIDAB in the last 72 hours.  Invalid input(s): FREET3  Other results:   Imaging    DG Chest 1 View  Result Date: 01/27/2020 CLINICAL DATA:  CHF EXAM: CHEST  1 VIEW COMPARISON:  01/01/2020 FINDINGS: Cardiomegaly. Interval placement of support apparatus including endotracheal tube, Impella device, and right upper extremity PICC, appropriately positioned. Cardiomegaly with pulmonary vascular prominence but without overt edema or focal airspace opacity. IMPRESSION: 1. Interval placement of support apparatus including endotracheal tube, Impella device, and right upper extremity PICC, appropriately positioned. 2. Cardiomegaly with pulmonary vascular prominence but without overt edema or focal airspace opacity. Electronically Signed   By: Eddie Candle M.D.   On: 01/28/2020 18:14   DG Chest 1 View  Result Date: 01/07/2020 CLINICAL DATA:  Impella placement. EXAM: CHEST  1 VIEW; DG C-ARM 1-60 MIN Radiation exposure index: 32.02 mGy. COMPARISON:  December 29, 2019. FINDINGS: Single intraoperative fluoroscopic image was obtained of the lower chest. There appears to be an Impella device position over the superior portion of the cardiac shadow. IMPRESSION: Fluoroscopic guidance provided during Impella device placement. Electronically Signed   By: Marijo Conception M.D.   On: 01/11/2020 17:09   DG C-Arm 1-60 Min  Result Date: 01/28/2020 CLINICAL DATA:  Impella placement. EXAM: CHEST  1 VIEW; DG C-ARM 1-60 MIN  Radiation exposure index: 32.02 mGy. COMPARISON:  December 29, 2019. FINDINGS: Single intraoperative fluoroscopic image was obtained of the lower chest. There appears to be an Impella device position over the superior portion of the cardiac shadow. IMPRESSION: Fluoroscopic guidance provided during Impella device placement. Electronically Signed   By: Marijo Conception M.D.   On: 01/28/2020 17:09   VAS US DOPPLER PRE CABG  Result Date: 01/26/2020 PREOPERATIVE VASCULAR EVALUATION  Indications:      Pre-CABG. Risk Factors:     Hypertension, hyperlipidemia, Diabetes, coronary artery  disease. Limitations:      Bandaging from proximal calf to foot bilaterally. Comparison Study: 08-09-2017 ABI study available. Performing Technologist: Darlin Coco RDMS  Examination Guidelines: A complete evaluation includes B-mode imaging, spectral Doppler, color Doppler, and power Doppler as needed of all accessible portions of each vessel. Bilateral testing is considered an integral part of a complete examination. Limited examinations for reoccurring indications may be performed as noted.  Right Carotid Findings: +----------+--------+--------+--------+--------+--------+             PSV cm/s EDV cm/s Stenosis Describe Comments  +----------+--------+--------+--------+--------+--------+  CCA Prox   70       19                                   +----------+--------+--------+--------+--------+--------+  CCA Distal 76       19                                   +----------+--------+--------+--------+--------+--------+  ICA Prox   48       18                                   +----------+--------+--------+--------+--------+--------+  ICA Distal 68       27                                   +----------+--------+--------+--------+--------+--------+  ECA        69       12                                   +----------+--------+--------+--------+--------+--------+ Portions of this table do not appear on this page.  +----------+--------+-------+----------------+------------+             PSV cm/s EDV cms Describe         Arm Pressure  +----------+--------+-------+----------------+------------+  Subclavian 93               Multiphasic, WNL               +----------+--------+-------+----------------+------------+ +---------+--------+--+--------+--+---------+  Vertebral PSV cm/s 35 EDV cm/s 13 Antegrade  +---------+--------+--+--------+--+---------+ Left Carotid Findings: +----------+--------+--------+--------+--------+--------+             PSV cm/s EDV cm/s Stenosis Describe Comments  +----------+--------+--------+--------+--------+--------+  CCA Prox   99       20                                   +----------+--------+--------+--------+--------+--------+  CCA Distal 87       22                                   +----------+--------+--------+--------+--------+--------+  ICA Prox   60       24                                   +----------+--------+--------+--------+--------+--------+  ICA Distal 58  19                                   +----------+--------+--------+--------+--------+--------+  ECA        98       13                                   +----------+--------+--------+--------+--------+--------+ +----------+--------+--------+----------------+------------+  Subclavian PSV cm/s EDV cm/s Describe         Arm Pressure  +----------+--------+--------+----------------+------------+             101               Multiphasic, WNL               +----------+--------+--------+----------------+------------+ +---------+--------+--+--------+--+---------+  Vertebral PSV cm/s 36 EDV cm/s 11 Antegrade  +---------+--------+--+--------+--+---------+  ABI Findings: +--------+------------------+-----+---------+----------------------------------+  Right    Rt Pressure (mmHg) Index Waveform  Comment                             +--------+------------------+-----+---------+----------------------------------+  Brachial                           triphasic                                     +--------+------------------+-----+---------+----------------------------------+  PTA                               triphasic Unable to insonate mid to distal                                                 PTA due to bandaging                +--------+------------------+-----+---------+----------------------------------+  DP                                          Unable to insonate due to                                                        bandaging                           +--------+------------------+-----+---------+----------------------------------+ +--------+------------------+-----+---------+----------------------------------+  Left     Lt Pressure (mmHg) Index Waveform  Comment                             +--------+------------------+-----+---------+----------------------------------+  Brachial 134                      triphasic                                     +--------+------------------+-----+---------+----------------------------------+  PTA                               triphasic Unable to insonate mid to distal                                                 PTA due to bandaging                +--------+------------------+-----+---------+----------------------------------+  DP                                          Unable to insonate due to                                                        bandaging                           +--------+------------------+-----+---------+----------------------------------+ +-------+---------------+----------------+  ABI/TBI Today's ABI/TBI Previous ABI/TBI  +-------+---------------+----------------+  Right                   1.56/0.65         +-------+---------------+----------------+  Left                    1.31/0.69         +-------+---------------+----------------+  Right Doppler Findings: +--------+--------+-----+---------+--------+  Site     Pressure Index Doppler   Comments   +--------+--------+-----+---------+--------+  Brachial                triphasic           +--------+--------+-----+---------+--------+  Radial                  triphasic           +--------+--------+-----+---------+--------+  Ulnar                   triphasic           +--------+--------+-----+---------+--------+  Left Doppler Findings: +--------+--------+-----+---------+--------+  Site     Pressure Index Doppler   Comments  +--------+--------+-----+---------+--------+  Brachial 134            triphasic           +--------+--------+-----+---------+--------+  Radial                  triphasic           +--------+--------+-----+---------+--------+  Ulnar                   triphasic           +--------+--------+-----+---------+--------+  Summary: Right Carotid: The extracranial vessels were near-normal with only minimal wall                thickening or plaque. Left Carotid: The extracranial vessels were near-normal with only minimal wall               thickening or plaque. Vertebrals:  Bilateral vertebral arteries demonstrate antegrade flow. Subclavians: Normal flow hemodynamics were seen  in bilateral subclavian              arteries. Right Upper Extremity: Doppler waveforms remain within normal limits with right radial compression. Doppler waveforms remain within normal limits with right ulnar compression. Left Upper Extremity: Doppler waveforms decrease <50% w left radial compression. Doppler waveforms remain within normal limits with left ulnar compression.     Preliminary      Medications:     Scheduled Medications:  sodium chloride   Intravenous Once   atorvastatin  80 mg Oral Daily   busPIRone  10 mg Oral TID   chlorhexidine gluconate (MEDLINE KIT)  15 mL Mouth Rinse BID   Chlorhexidine Gluconate Cloth  6 each Topical Daily   colchicine  0.6 mg Oral Daily   dapagliflozin propanediol  10 mg Oral Daily   dicyclomine  10 mg Oral TID AC   digoxin  0.125 mg Oral Daily   divalproex  500 mg  Oral QHS   FLUoxetine  40 mg Oral BID   insulin aspart  0-15 Units Subcutaneous Q4H   insulin glargine  10 Units Subcutaneous Daily   magnesium oxide  200 mg Oral Daily   mouth rinse  15 mL Mouth Rinse 10 times per day   mupirocin ointment   Nasal BID   potassium chloride  40 mEq Oral Daily   sodium chloride flush  10-40 mL Intracatheter Q12H   sodium chloride flush  10-40 mL Intracatheter Q12H   sodium chloride flush  3 mL Intravenous Q12H   spironolactone  25 mg Oral Daily    Infusions:  sodium chloride Stopped (01/06/20 1204)   albumin human 60 mL/hr at 01/02/2020 0700   amiodarone 30 mg/hr (01/04/2020 0700)   dexmedetomidine (PRECEDEX) IV infusion 1 mcg/kg/hr (01/24/2020 0700)   epinephrine     heparin 200 Units/hr (01/03/2020 0700)   impella catheter heparin 50 unit/mL in dextrose 5%     milrinone 0.25 mcg/kg/min (01/04/2020 0700)   nitroGLYCERIN 60 mcg/min (01/02/2020 0700)    PRN Medications: sodium chloride, acetaminophen, albumin human, fentaNYL (SUBLIMAZE) injection, ondansetron (ZOFRAN) IV, sodium chloride flush, sodium chloride flush, traMADol     Assessment/Plan   1. Atrial fibrillation: H/o PAF.  He was on dofetilide in the remote past but this was stopped due to noncompliance. He had an upper GI bleed from antral ulcers in 3/12. He was seen by GI and was deemed safe to restart anticoagulation as long as he remains on a PPI.  No apparent recurrence of AF until just prior to this admission, was cardioverted back to NSR in ER but went back into atrial fibrillation.  Now he has returned to NSR.  - Continue amiodarone gtt 30 mg/hr.  - Continue heparin gtt.  - Maze with CABG.  2. CAD: Nonobstructive mild CAD on remote cath in 2002. He has had chest pain but is pleuritic.  HS-TnI 64 => 92, suspect demand ischemia with volume overload and afib/RVR.   Cath this admission with severe 3 vessel disease, ideally treated by CABG given extent of disease.  If no CABG,  could approach with multivessel PCI but would be complex and need Impella support.  - Continue atorvastatin, ASA.  - Dr. [name] consulting, considering high-risk CABG early next week, supported by Impella 5.5.  - Unable to do cardiac MRI for viability given old ICD leads.  However, no thinning of the myocardium noted.  3. Acute on chronic systolic CHF: Initially nonischemic cardiomyopathy. Possible familial cardiomyopathy as both parents had  cardiomyopathy and died at around 81.However, [employer] gene testing did not show any common mutation for cardiomyopathy.  However, this admission noted to have severe 3 vessel disease so suspect component of ischemic cardiomyopathy. [employer] ICD. Echo in 8/20 with EF 15% and mildly decreased RV function. Echo this admission with EF < 20%, moderate LV dilation, RV mildly reduced, severe LAE, no significant MR.Low output HF with markedly low EF.  He has a long history of cardiomyopathy (20 yrs), tends to minimize symptoms.  Cardiorenal syndrome with creatinine up to 2.2, now stabilized on milrinone and Impella 5.5 with creatinine down to 1.55.  This morning, Co-ox 60% with CVP 9, creatinine 1.55.  CI 1.9.  - Increased Impella to P9.  Hopefully will tolerate with higher CVP, rare PVCs observed so far.  Heparin gtt running, will check LDH. Echo today for Impella position.  - NTG gtt @ 60 to maintain MAP < 85 (70s today).  Can add hydralazine per tube if needed.  - Continue milrinone 0.25, can wean off if cardiac output stabilizes on Impella.  - Hold diuretics today, aim for CVP around 10.  - Continue dig 0.125 mg daily. Level ok today.    - Continue spironolactone 25 mg daily.  - Continue dapagliflozin.  - Coreg held with low output.   - Continue Unna boots.  - As above, considering high risk CABG with severe 3VD.  Alternatively, may need to consider directly to LVAD if thought too high risk.  RV function is reasonable, at most mildly hypokinetic on echo 12/10.  4.  OSA: OSA on sleep study, has not been using CPAP.  - Start CPAP here.  5. AKI on CKD stage ?3: Last creatinine prior to admission was 1.38.  Creatinine peaked 2.2 this admission.  Suspect cardiorenal syndrome. Creatinine down to 1.55 with Impella.   - Holding Lasix.   6. Diabetes: hgbA1c 10.6.  - Insulin sliding scale.  - Consulted diabetes coordinator.  - Followed by Dr Loanne Drilling.  7. Hyponatremia: Hypervolemic hyponatremia: Fluid restrict.  8. Torsades/ICD shock: 12/7/212 in hospital event, in setting of severe hypokalemia and hypomagnesemia.  - Follow K and Mg closely.  - Continue IV amio  9. Gout: h/o gout. Complains of rt knee pain c/w previous flares - treated w/ prednisone burst (completed).  - Continue colchicine 0.6 (takes with relief at home).  10. Pulmonary: Intubated s/p Impella 5.5.   - Discussed with CCM, hopefully extubate today.  - CXR  CRITICAL CARE Performed by: Loralie Champagne  Total critical care time: 35 minutes  Critical care time was exclusive of separately billable procedures and treating other patients.  Critical care was necessary to treat or prevent imminent or life-threatening deterioration.  Critical care was time spent personally by me on the following activities: development of treatment plan with patient and/or surrogate as well as nursing, discussions with consultants, evaluation of patient's response to treatment, examination of patient, obtaining history from patient or surrogate, ordering and performing treatments and interventions, ordering and review of laboratory studies, ordering and review of radiographic studies, pulse oximetry and re-evaluation of patient's condition.  Loralie Champagne 01/01/2020 7:54 AM  Event: Called to bedside for alarms, Impella tip is now in aortic root.  Attempted to advance gently with echo guidance and Impella rep at bedside, unable to cross.  Impella decreased to P1, discussed with Dr. [name] with plan to return to OR to  wire and replace in LV.   Loralie Champagne 01/11/2020 9:22 AM

## 2020-01-09 NOTE — Brief Op Note (Signed)
12/30/2019  12:50 PM  PATIENT:  Corey Palmer  49 y.o. male  PRE-OPERATIVE DIAGNOSIS:  coronary artery disease  POST-OPERATIVE DIAGNOSIS:  coronary artery disease  PROCEDURE:  Procedure(s): PLACEMENT OF IMPELLA LEFT VENTRICULAR ASSIST DEVICE (N/A)  SURGEON:  Surgeon(s) and Role:    * [name], MD - Primary  PHYSICIAN ASSISTANT: na  ASSISTANTS: staff   ANESTHESIA:   general  EBL:  200 mL   BLOOD ADMINISTERED:none  DRAINS: none   LOCAL MEDICATIONS USED:  NONE  SPECIMEN:  No Specimen  DISPOSITION OF SPECIMEN:  N/A  COUNTS:  YES  TOURNIQUET:  * No tourniquets in log *  DICTATION: .Note written in EPIC  PLAN OF CARE: Admit to inpatient   PATIENT DISPOSITION:  ICU - intubated and critically ill.   Delay start of Pharmacological VTE agent (>24hrs) due to surgical blood loss or risk of bleeding: yes

## 2020-01-09 NOTE — Addendum Note (Signed)
Addendum  created 01/01/2020 1419 by Darral Dash, DO   Child order released for a procedure order, Clinical Note Signed, Intraprocedure Blocks edited, LDA created via procedure documentation

## 2020-01-09 NOTE — Progress Notes (Signed)
[place] for heparin Indication: Impella 5.5  Allergies  Allergen Reactions  . Orange Fruit Anaphylaxis, Hives and Other (See Comments)    Per Pt- Blisters around lips and Hives all over, also  . Penicillins Anaphylaxis    Did it involve swelling of the face/tongue/throat, SOB, or low BP? Yes Did it involve sudden or severe rash/hives, skin peeling, or any reaction on the inside of your mouth or nose? Yes Did you need to seek medical attention at a hospital or doctor's office? No When did it last happen?childhood If all above answers are "NO", may proceed with cephalosporin use.  [name] [Empagliflozin] Itching  . Basaglar Kwikpen [Insulin Glargine] Nausea And Vomiting    Patient Measurements: Height: 6\' 3"  (190.5 cm) Weight: 104.6 kg (230 lb 9.6 oz) IBW/kg (Calculated) : 84.5 Heparin dosing wt: 101 kg  Vital Signs: Temp: 98.1 F (36.7 C) (12/12 2030) Temp Source: Core (12/12 1600) BP: 94/81 (12/12 2000) Pulse Rate: 60 (12/12 2030)  Labs: Recent Labs    01/07/20 0319 01/07/20 0321 01/12/2020 0425 01/02/2020 1448 01/19/2020 2252 01/23/2020 0428 01/25/2020 0616 01/01/2020 1127 01/21/2020 1130 01/11/2020 1149 01/13/2020 2021  HGB 15.2  --  15.8   < >  --  14.2  --  16.0 16.3 15.6  --   HCT 47.0  --  49.2   < >  --  41.5  --  47.0 48.0 46.0  --   PLT 275  --  354  --   --  266  --   --   --   --   --   APTT 67*  --   --   --   --   --   --   --   --   --   --   HEPARINUNFRC  --    < > 0.45  --  <0.10*  --  <0.10*  --   --   --  0.16*  CREATININE 2.06*  --  2.02*   < >  --  1.55*  --  1.40*  --  1.40*  --    < > = values in this interval not displayed.    Estimated Creatinine Clearance: 81.7 mL/min (A) (by C-G formula based on SCr of 1.4 mg/dL (H)).   Assessment: 49 yo male s/p impella 5.5 placement. Heparin via purge pump at 4.6 ml/hr (230 units/hr). Systemic heparin at 500 units/hr -heparin level= 0.16  Goal of Therapy:  Heparin  level 0.2-0.5 units/ml Monitor platelets by anticoagulation protocol: Yes   Plan:  Increase systemic heparin to 600 units/hr  Heparin level in 6 hours  Hildred Laser, PharmD Clinical Pharmacist **Pharmacist phone directory can now be found on [email or web address] ([email or web address]).  Listed under [place].

## 2020-01-09 NOTE — Transfer of Care (Signed)
Immediate Anesthesia Transfer of Care Note  Patient: Corey Palmer  Procedure(s) Performed: PLACEMENT OF IMPELLA LEFT VENTRICULAR ASSIST DEVICE (N/A )  Patient Location: SICU  Anesthesia Type:General  Level of Consciousness: sedated and Patient remains intubated per anesthesia plan  Airway & Oxygen Therapy: Patient remains intubated per anesthesia plan and Patient placed on Ventilator (see vital sign flow sheet for setting)  Post-op Assessment: Report given to RN and Post -op Vital signs reviewed and stable  Post vital signs: Reviewed and stable  Last Vitals:  Vitals Value Taken Time  BP    Temp    Pulse    Resp    SpO2      Last Pain:  Vitals:   01/12/2020 0800  TempSrc: Core  PainSc:       Patients Stated Pain Goal: 0 (69/79/48 0165)  Complications: No complications documented.

## 2020-01-09 NOTE — Anesthesia Postprocedure Evaluation (Signed)
Anesthesia Post Note  Patient: Corey Palmer  Procedure(s) Performed: AXILLARY  ARTERY PLACEMENT OF ABIOMED IMPELLA  5.5 LEFT VENTRICULAR ASSIST DEVICE (Right Chest)     Patient location during evaluation: SICU Anesthesia Type: General Level of consciousness: sedated Pain management: pain level controlled Vital Signs Assessment: post-procedure vital signs reviewed and stable Respiratory status: patient remains intubated per anesthesia plan Cardiovascular status: stable Postop Assessment: no apparent nausea or vomiting Anesthetic complications: no   No complications documented.  Last Vitals:  Vitals:   01/19/2020 1000 01/04/2020 1015  BP:    Pulse: (!) 55 (!) 55  Resp: 18 18  Temp: 37 C 37 C  SpO2: 100% 100%    Last Pain:  Vitals:   12/29/2019 0800  TempSrc: Core  PainSc:                  March Rummage Teren Franckowiak

## 2020-01-09 NOTE — Progress Notes (Signed)
[place] for heparin Indication: Impella 5.5  Allergies  Allergen Reactions  . Orange Fruit Anaphylaxis, Hives and Other (See Comments)    Per Pt- Blisters around lips and Hives all over, also  . Penicillins Anaphylaxis    Did it involve swelling of the face/tongue/throat, SOB, or low BP? Yes Did it involve sudden or severe rash/hives, skin peeling, or any reaction on the inside of your mouth or nose? Yes Did you need to seek medical attention at a hospital or doctor's office? No When did it last happen?childhood If all above answers are "NO", may proceed with cephalosporin use.  [name] [Empagliflozin] Itching  . Basaglar Kwikpen [Insulin Glargine] Nausea And Vomiting    Patient Measurements: Height: 6\' 3"  (190.5 cm) Weight: 101.9 kg (224 lb 10.4 oz) IBW/kg (Calculated) : 84.5 Heparin dosing wt: 101 kg  Vital Signs: Temp: 97.16 F (36.2 C) (12/12 0010) Temp Source: Core (Comment) (12/12 0000) BP: 91/71 (12/12 0000) Pulse Rate: 51 (12/11 2330)  Labs: Recent Labs    01/06/20 0424 01/06/20 0607 01/07/20 0319 01/07/20 0321 01/01/2020 0425 01/24/2020 1448 01/23/2020 1452 01/16/2020 1530 01/21/2020 1937 01/18/2020 2252  HGB 14.6  --  15.2  --  15.8 17.3* 17.7* 16.7 16.3  --   HCT 46.5  --  47.0  --  49.2 51.0 52.0 49.0 48.0  --   PLT 225  --  275  --  354  --   --   --   --   --   APTT 45*  --  67*  --   --   --   --   --   --   --   HEPARINUNFRC  --    < >  --  0.35 0.45  --   --   --   --  <0.10*  CREATININE 1.77*   < > 2.06*  --  2.02* 1.60*  --  1.60*  --   --    < > = values in this interval not displayed.    Estimated Creatinine Clearance: 70.7 mL/min (A) (by C-G formula based on SCr of 1.6 mg/dL (H)).   Assessment: 49 yo male s/p impella 5.5 placement.   Heparin level undetectable on heparin via purge pump at 9.4 ml/hr (470 units/hr). No bleeding noted per RN.  Okay to start systemic heparin when needed per Dr.  [name].  Goal of Therapy:  Heparin level 0.2-0.5 units/ml Monitor platelets by anticoagulation protocol: Yes   Plan:   Start systemic heparin at 200 units/hr  Will f/u 6 hr heparin level  Sherlon Handing, PharmD, BCPS Please see amion for complete clinical pharmacist phone list 01/09/2020 12:57 AM

## 2020-01-09 NOTE — Progress Notes (Signed)
Overnight Events  Decision not to extubate due to pt unstability. Dr. [name], Dr. Haroldine Laws, and CCM aware of patient status.  Pt dropped from P8 to P7 due to suction alarms and ectopy. Impella rep called multiple times for assistance. Cardiology fellow at bedside during this time.  Pt will follow commands this am, needs encouragement & coaching to remain calm.  Pt SB, HR 50's, BP map 70-90. PA pressures 20's/10's CI 2-2.6  CO 4.7-5 CVP 6-8  Pt on Milrinone @ .25 Mcg/kg/min Amio @ 30 mg/hr Precedex @ 1 mch/kg/min Heparin @ 200 units/hr (restarted at 0126) Nitro @ 20-50 mcg/min  Pt received 1.5L LR, and 500cc Albumin overnight for volume due to low CVP's and suction alarms on Impella. Ordered by Cardiology Fellow Dr. Marcelle Smiling.  Mixed venous O2 sat- 60% this am

## 2020-01-09 NOTE — Anesthesia Procedure Notes (Signed)
Central Venous Catheter Insertion Performed by: Darral Dash, DO, anesthesiologist Start/End12/13/2021 1:11 PM, 01/08/2020 1:14 PM Patient location: Pre-op. Preanesthetic checklist: patient identified, IV checked, site marked, risks and benefits discussed, surgical consent, monitors and equipment checked, pre-op evaluation, timeout performed and anesthesia consent Hand hygiene performed  and maximum sterile barriers used  PA cath was placed.[place]wan type:thermodilution PA Cath depth:50 Procedure performed without using ultrasound guided technique. Attempts: 1 Patient tolerated the procedure well with no immediate complications.

## 2020-01-09 NOTE — Progress Notes (Signed)
[place] for heparin Indication: Impella 5.5  Allergies  Allergen Reactions  . Orange Fruit Anaphylaxis, Hives and Other (See Comments)    Per Pt- Blisters around lips and Hives all over, also  . Penicillins Anaphylaxis    Did it involve swelling of the face/tongue/throat, SOB, or low BP? Yes Did it involve sudden or severe rash/hives, skin peeling, or any reaction on the inside of your mouth or nose? Yes Did you need to seek medical attention at a hospital or doctor's office? No When did it last happen?childhood If all above answers are "NO", may proceed with cephalosporin use.  [name] [Empagliflozin] Itching  . Basaglar Kwikpen [Insulin Glargine] Nausea And Vomiting    Patient Measurements: Height: 6\' 3"  (190.5 cm) Weight: 104.6 kg (230 lb 9.6 oz) IBW/kg (Calculated) : 84.5 Heparin dosing wt: 101 kg  Vital Signs: Temp: 98.1 F (36.7 C) (12/12 2100) Temp Source: Core (12/12 1600) BP: 94/81 (12/12 2000) Pulse Rate: 62 (12/12 2100)  Labs: Recent Labs    01/07/20 0319 01/07/20 0321 01/07/2020 0425 01/28/2020 1448 01/28/2020 2252 01/07/2020 0428 01/01/2020 0616 01/05/2020 1127 01/02/2020 1130 12/31/2019 1149 01/07/2020 2021  HGB 15.2  --  15.8   < >  --  14.2  --  16.0 16.3 15.6  --   HCT 47.0  --  49.2   < >  --  41.5  --  47.0 48.0 46.0  --   PLT 275  --  354  --   --  266  --   --   --   --   --   APTT 67*  --   --   --   --   --   --   --   --   --   --   HEPARINUNFRC  --    < > 0.45  --  <0.10*  --  <0.10*  --   --   --  0.16*  CREATININE 2.06*  --  2.02*   < >  --  1.55*  --  1.40*  --  1.40*  --    < > = values in this interval not displayed.    Estimated Creatinine Clearance: 81.7 mL/min (A) (by C-G formula based on SCr of 1.4 mg/dL (H)).   Assessment: 49 yo male s/p impella 5.5 placement. Heparin via purge pump at 4.6 ml/hr (230 units/hr). Purge rate was 10.1 ml/hr early this am then when pt back from OR purge rate was 2.6  ml/hr and now 4.6 ml/hr -Systemic heparin at 500 units/hr -heparin level= 0.16    Goal of Therapy:  Heparin level 0.2-0.5 units/ml Monitor platelets by anticoagulation protocol: Yes   Plan:  Increase systemic heparin to 600 units/hr  Heparin level in 6 hours  Hildred Laser, PharmD Clinical Pharmacist **Pharmacist phone directory can now be found on [email or web address] ([email or web address]).  Listed under [place].

## 2020-01-09 NOTE — Progress Notes (Signed)
Received verbal order from Dr Lynetta Mare to begin weaning. Mode changed to prvc;simv;psv.

## 2020-01-09 NOTE — Progress Notes (Signed)
[place] for heparin Indication: Impella 5.5  Allergies  Allergen Reactions  . Orange Fruit Anaphylaxis, Hives and Other (See Comments)    Per Pt- Blisters around lips and Hives all over, also  . Penicillins Anaphylaxis    Did it involve swelling of the face/tongue/throat, SOB, or low BP? Yes Did it involve sudden or severe rash/hives, skin peeling, or any reaction on the inside of your mouth or nose? Yes Did you need to seek medical attention at a hospital or doctor's office? No When did it last happen?childhood If all above answers are "NO", may proceed with cephalosporin use.  [name] [Empagliflozin] Itching  . Basaglar Kwikpen [Insulin Glargine] Nausea And Vomiting    Patient Measurements: Height: 6\' 3"  (190.5 cm) Weight: 104.6 kg (230 lb 9.6 oz) IBW/kg (Calculated) : 84.5 Heparin dosing wt: 101 kg  Vital Signs: Temp: 98.24 F (36.8 C) (12/12 0645) Temp Source: Core (12/12 0400) BP: 106/77 (12/12 0600) Pulse Rate: 54 (12/12 0700)  Labs: Recent Labs    01/07/20 0319 01/07/20 0321 01/27/2020 0425 01/07/2020 1448 01/15/2020 1452 01/05/2020 1530 01/23/2020 1937 01/25/2020 2252 01/08/2020 0428 01/13/2020 0616  HGB 15.2  --  15.8 17.3*   < > 16.7 16.3  --  14.2  --   HCT 47.0  --  49.2 51.0   < > 49.0 48.0  --  41.5  --   PLT 275  --  354  --   --   --   --   --  266  --   APTT 67*  --   --   --   --   --   --   --   --   --   HEPARINUNFRC  --    < > 0.45  --   --   --   --  <0.10*  --  <0.10*  CREATININE 2.06*  --  2.02* 1.60*  --  1.60*  --   --  1.55*  --    < > = values in this interval not displayed.    Estimated Creatinine Clearance: 73.8 mL/min (A) (by C-G formula based on SCr of 1.55 mg/dL (H)).   Assessment: 49 yo male s/p impella 5.5 placement.   200 units/hr systemic started overnight. Heparin level continues to be undetectable.  Heparin via purge pump at 10 ml/hr (500 units/hr). No bleeding noted per RN. Impella  increased to P8 on rounds.   Goal of Therapy:  Heparin level 0.2-0.5 units/ml Monitor platelets by anticoagulation protocol: Yes   Plan:   Increase systemic heparin to 500 units/hr  Follow up heparin level this afternoon  Erin Hearing PharmD., BCPS Clinical Pharmacist 01/21/2020 7:56 AM

## 2020-01-09 NOTE — Progress Notes (Signed)
  Echocardiogram 2D Echocardiogram has been performed.  Corey Palmer 01/12/2020, 9:23 AM

## 2020-01-09 NOTE — Plan of Care (Signed)
°  Problem: Cardiac: Goal: Ability to achieve and maintain adequate cardiopulmonary perfusion will improve Outcome: Progressing   Problem: Clinical Measurements: Goal: Respiratory complications will improve Outcome: Progressing Note: Extubated this evening to 4L [place]   Problem: Coping: Goal: Level of anxiety will decrease Outcome: Progressing

## 2020-01-10 ENCOUNTER — Inpatient Hospital Stay (HOSPITAL_COMMUNITY): Payer: BC Managed Care – PPO

## 2020-01-10 ENCOUNTER — Encounter (HOSPITAL_COMMUNITY): Payer: Self-pay | Admitting: Cardiothoracic Surgery

## 2020-01-10 DIAGNOSIS — I2510 Atherosclerotic heart disease of native coronary artery without angina pectoris: Secondary | ICD-10-CM | POA: Diagnosis not present

## 2020-01-10 DIAGNOSIS — Z95811 Presence of heart assist device: Secondary | ICD-10-CM

## 2020-01-10 DIAGNOSIS — I5022 Chronic systolic (congestive) heart failure: Secondary | ICD-10-CM | POA: Diagnosis not present

## 2020-01-10 DIAGNOSIS — J9601 Acute respiratory failure with hypoxia: Secondary | ICD-10-CM

## 2020-01-10 LAB — ECHOCARDIOGRAM LIMITED
Height: 75 in
Weight: 3774.28 oz

## 2020-01-10 LAB — BASIC METABOLIC PANEL
Anion gap: 11 (ref 5–15)
BUN: 30 mg/dL — ABNORMAL HIGH (ref 6–20)
CO2: 21 mmol/L — ABNORMAL LOW (ref 22–32)
Calcium: 8.5 mg/dL — ABNORMAL LOW (ref 8.9–10.3)
Chloride: 100 mmol/L (ref 98–111)
Creatinine, Ser: 1.41 mg/dL — ABNORMAL HIGH (ref 0.61–1.24)
GFR, Estimated: >60 mL/min (ref >60)
Glucose, Bld: 113 mg/dL — ABNORMAL HIGH (ref 70–99)
Potassium: 3.8 mmol/L (ref 3.5–5.1)
Sodium: 132 mmol/L — ABNORMAL LOW (ref 135–145)

## 2020-01-10 LAB — COOXEMETRY PANEL
Carboxyhemoglobin: 1.3 % (ref 0.5–1.5)
Methemoglobin: 0.8 % (ref 0.0–1.5)
O2 Saturation: 67.3 %
Total hemoglobin: 13.3 g/dL (ref 12.0–16.0)

## 2020-01-10 LAB — CBC
HCT: 39.2 % (ref 39.0–52.0)
Hemoglobin: 13.2 g/dL (ref 13.0–17.0)
MCH: 26.9 pg (ref 26.0–34.0)
MCHC: 33.7 g/dL (ref 30.0–36.0)
MCV: 80 fL (ref 80.0–100.0)
Platelets: 237 10*3/uL (ref 150–400)
RBC: 4.9 MIL/uL (ref 4.22–5.81)
RDW: 15.1 % (ref 11.5–15.5)
WBC: 11 10*3/uL — ABNORMAL HIGH (ref 4.0–10.5)
nRBC: 0 % (ref 0.0–0.2)

## 2020-01-10 LAB — LACTATE DEHYDROGENASE: LDH: 203 U/L — ABNORMAL HIGH (ref 98–192)

## 2020-01-10 LAB — GLUCOSE, CAPILLARY
Glucose-Capillary: 105 mg/dL — ABNORMAL HIGH (ref 70–99)
Glucose-Capillary: 112 mg/dL — ABNORMAL HIGH (ref 70–99)
Glucose-Capillary: 118 mg/dL — ABNORMAL HIGH (ref 70–99)
Glucose-Capillary: 150 mg/dL — ABNORMAL HIGH (ref 70–99)
Glucose-Capillary: 157 mg/dL — ABNORMAL HIGH (ref 70–99)
Glucose-Capillary: 92 mg/dL (ref 70–99)
Glucose-Capillary: 96 mg/dL (ref 70–99)

## 2020-01-10 LAB — MAGNESIUM: Magnesium: 1.7 mg/dL (ref 1.7–2.4)

## 2020-01-10 LAB — HEPARIN LEVEL (UNFRACTIONATED)
Heparin Unfractionated: <0.1 IU/mL — ABNORMAL LOW (ref 0.30–0.70)
Heparin Unfractionated: <0.1 IU/mL — ABNORMAL LOW (ref 0.30–0.70)

## 2020-01-10 MED ORDER — MAGNESIUM SULFATE 2 GM/50ML IV SOLN
2.0000 g | Freq: Once | INTRAVENOUS | Status: AC
  Administered 2020-01-10: 09:00:00 2 g via INTRAVENOUS
  Filled 2020-01-10: qty 50

## 2020-01-10 MED ORDER — SPIRONOLACTONE 12.5 MG HALF TABLET: 12.5000 mg | ORAL_TABLET | Freq: Every day | ORAL | Status: DC

## 2020-01-10 MED ORDER — HYDRALAZINE HCL 50 MG PO TABS
50.0000 mg | ORAL_TABLET | Freq: Three times a day (TID) | ORAL | Status: DC
  Administered 2020-01-10 – 2020-01-11 (×3): 50 mg via ORAL
  Filled 2020-01-10 (×3): qty 1

## 2020-01-10 MED ORDER — LIDOCAINE 5 % EX PTCH
1.0000 | MEDICATED_PATCH | CUTANEOUS | Status: DC
  Administered 2020-01-10 – 2020-01-11 (×2): 1 via TRANSDERMAL
  Filled 2020-01-10 (×3): qty 1

## 2020-01-10 MED ORDER — MORPHINE SULFATE (PF) 4 MG/ML IV SOLN
4.0000 mg | Freq: Once | INTRAVENOUS | Status: AC
Start: 2020-01-10 — End: 2020-01-10
  Administered 2020-01-10: 09:00:00 4 mg via INTRAVENOUS
  Filled 2020-01-10: qty 1

## 2020-01-10 MED ORDER — ISOSORB DINITRATE-HYDRALAZINE 20-37.5 MG PO TABS
1.0000 | ORAL_TABLET | Freq: Three times a day (TID) | ORAL | Status: DC
  Administered 2020-01-10 (×2): 1 via ORAL
  Filled 2020-01-10 (×2): qty 1

## 2020-01-10 MED ORDER — ALPRAZOLAM 0.25 MG PO TABS
0.2500 mg | ORAL_TABLET | Freq: Two times a day (BID) | ORAL | Status: DC | PRN
  Administered 2020-01-10 – 2020-01-12 (×3): 0.25 mg via ORAL
  Filled 2020-01-10 (×3): qty 1

## 2020-01-10 MED ORDER — SPIRONOLACTONE 25 MG PO TABS
25.0000 mg | ORAL_TABLET | Freq: Every day | ORAL | Status: DC
  Administered 2020-01-10 – 2020-01-11 (×2): 25 mg via ORAL
  Filled 2020-01-10 (×2): qty 1

## 2020-01-10 MED ORDER — ASPIRIN EC 81 MG PO TBEC
81.0000 mg | DELAYED_RELEASE_TABLET | Freq: Every day | ORAL | Status: DC
  Administered 2020-01-10 – 2020-01-11 (×2): 81 mg via ORAL
  Filled 2020-01-10 (×2): qty 1

## 2020-01-10 MED ORDER — MORPHINE SULFATE (PF) 4 MG/ML IV SOLN: 4.0000 mg | Freq: Once | INTRAVENOUS | Status: DC

## 2020-01-10 MED ORDER — NITROGLYCERIN IN D5W 200-5 MCG/ML-% IV SOLN
0.0000 ug/min | INTRAVENOUS | Status: DC
  Administered 2020-01-10: 16:00:00 5 ug/min via INTRAVENOUS
  Filled 2020-01-10 (×2): qty 250

## 2020-01-10 MED ORDER — LOSARTAN POTASSIUM 25 MG PO TABS
25.0000 mg | ORAL_TABLET | Freq: Every day | ORAL | Status: DC
  Administered 2020-01-10 – 2020-01-11 (×2): 25 mg via ORAL
  Filled 2020-01-10 (×2): qty 1

## 2020-01-10 MED ORDER — HYDRALAZINE HCL 20 MG/ML IJ SOLN
10.0000 mg | INTRAMUSCULAR | Status: DC | PRN
  Administered 2020-01-10 – 2020-01-11 (×2): 10 mg via INTRAVENOUS
  Filled 2020-01-10 (×2): qty 1

## 2020-01-10 MED ORDER — OXYCODONE-ACETAMINOPHEN 5-325 MG PO TABS
1.0000 | ORAL_TABLET | ORAL | Status: DC | PRN
  Administered 2020-01-10: 2 via ORAL
  Administered 2020-01-10: 1 via ORAL
  Administered 2020-01-10 – 2020-01-11 (×2): 2 via ORAL
  Filled 2020-01-10 (×3): qty 2
  Filled 2020-01-10: qty 1

## 2020-01-10 NOTE — Progress Notes (Signed)
  Echocardiogram 2D Echocardiogram has been performed.  Corey Palmer 01/10/2020, 10:06 AM

## 2020-01-10 NOTE — Op Note (Signed)
Procedure(s): PLACEMENT OF IMPELLA LEFT VENTRICULAR ASSIST DEVICE Procedure Note  IMRAAN WENDELL male 49 y.o. 01/02/2020 Procedure(s) and Anesthesia Type:    * PLACEMENT OF IMPELLA LEFT VENTRICULAR ASSIST DEVICE - General  Surgeon(s) and Role:    * [name], MD - Primary   Indications: The patient had undergone Impella LVAD insertion on 01/02/2020 but the device was pulled across the aortic valve during attempt at positioning at the bedside under echo guidance.  He was taken the operating room emergently for repositioning of the Impella device.     Surgeon: [name]   Assistants: Staff  Anesthesia: General endotracheal anesthesia  ASA Class: 5    Procedure Detail  PLACEMENT OF [place] LEFT VENTRICULAR ASSIST DEVICE Using emergency informed consent, patient was taken the operating room urgently on 01/05/2020.  He is placed in the supine position on the operating table.  Anesthesia was confirmed by the general and tracheal technique.  The anterior chest was cleansed and draped sterile field using Betadine solution.  A preop surgical pause was performed.  The prior right deltopectoral groove incision was reopened.  The existing Impella which was not across the aortic valve any longer was fully withdrawn and the existing Hemashield gold graft was clamped.  This was an de-aired and debris from the graft was removed bilateral active flush.  A 10 mm Hemashield gold graft was sewn to the end of the prior stump with a running suture of 5-0 Prolene.  This was used as a conduit through which a new Impella device was inserted using a modified Seldinger technique and under fluoroscopic and TEE guidance.  The device seated well and easily.  Hardware was withdrawn and the pump was turned on.  This was then secured at the level of the skin and the wound was closed in layers.  Sterile dressings were applied.  Estimated Blood Loss:  Minimal         Drains: None    Blood Given: none           Specimens: None         Implants: none        Complications:  * No complications entered in OR log *         Disposition: ICU - intubated and critically ill.         Condition: stable

## 2020-01-10 NOTE — Progress Notes (Signed)
NAME:  Corey Palmer, MRN:  [id number], DOB:  12/26/1970, LOS: 47 ADMISSION DATE:  12/31/2019, CONSULTATION DATE: 01/19/2020 REFERRING MD: Aundra Dubin, CHIEF COMPLAINT: Respiratory failure post Impella  HPI/course in hospital  49 year old man underwent Impella 5.5 insertion today for persistent symptoms of forward failure with low cardiac indices.  He presented last week for ICD shocks and was found to be in rapid atrial fibrillation for which he was cardioverted.  He was also found to be in decompensated heart failure with an ejection fraction of 15% and multivessel coronary disease on left heart catheterization.  He was started on tropes and diuresed which improved his congestive symptoms but his index remained low so an Impella was placed to optimize his hemodynamics prior to a high risk redo-bypass.  Uneventful Impella implantation.  Patient returned from operating room intubated.  He had to go back to the OR for displacement of Impala.  Currently waiting for CABG  Past Medical History   Past Medical History:  Diagnosis Date  . Anginal pain ([place])   . Anxiety   . Arthritis   . Automatic implantable cardioverter-defibrillator in situ 2007   [employer]   . Bipolar disorder ([place])   . CAD (coronary artery disease)    Cardiac cath (08/19/2000) - Nonobstructive, 40% stenosis of LAD,.   . Cancer [facility]) 2013   benign stomach cancer  . CHF (congestive heart failure) ([place])   . Chondrocalcinosis of right knee 06/11/2013  . Chronic systolic heart failure ([place])    2D echo (19/5093) - Systolic function severely reduced., LV EF 20-25%. Akinesis of apical and anteroseptal mycoardium.  last 2D echo (11/2008 ), LV EF 25-30%, diffuse hypokinesis, and grade 1 diastolic dysfunction.  . Depression   . Diabetes uncomplicated adult-type II    on insulin therapy, a1c 12.9 in 01/2011  . Dilated cardiomyopathy ([place])    status post ICD placement in 2008 c/b tearing at the atrial junction resulting in tamponade and  urgent thoracotomy  . Dyslipidemia   . Dysrhythmia   . GERD (gastroesophageal reflux disease)   . Gout, unspecified   . Hepatic steatosis 2011   seen on ultrasound.   . History of kidney stones   . Hx of echocardiogram    Echo (04/2013):  Mild LVH, EF 20-25%, mild LAE, mild to mod RVE with mild reduced RVSF.  Marland Kitchen Hypertension   . Obesity (BMI 30.0-34.9)   . Pacemaker   . Paroxysmal atrial fibrillation ([facility])    on chronic coumadin, goal INR 2-3. status post direct current  . Peptic ulcer   . Shortness of breath   . Sleep apnea    does not have CPAP     Past Surgical History:  Procedure Laterality Date  . BIOPSY  12/01/2019   Procedure: BIOPSY;  Surgeon: Arta Silence, MD;  Location: [facility] ENDOSCOPY;  Service: Endoscopy;;  . CARDIAC CATHETERIZATION    . CARDIAC PACEMAKER PLACEMENT    . CHOLECYSTECTOMY    . COLONOSCOPY    . CORONARY ARTERY BYPASS GRAFT    . ESOPHAGOGASTRODUODENOSCOPY  04/05/2011   Procedure: ESOPHAGOGASTRODUODENOSCOPY (EGD);  Surgeon: Irene Shipper, MD;  Location: [facility] ENDOSCOPY;  Service: Endoscopy;  Laterality: N/A;  . ESOPHAGOGASTRODUODENOSCOPY (EGD) WITH PROPOFOL N/A 12/01/2019   Procedure: ESOPHAGOGASTRODUODENOSCOPY (EGD) WITH PROPOFOL;  Surgeon: Arta Silence, MD;  Location: [facility] ENDOSCOPY;  Service: Endoscopy;  Laterality: N/A;  . JOINT REPLACEMENT     left knee replacement  . KNEE ARTHROSCOPY Right 06/11/2013   Procedure: RIGHT KNEE ARTHROSCOPY with  chondroplasty;  Surgeon: Johnny Bridge, MD;  Location: [place];  Service: Orthopedics;  Laterality: Right;  . PLACEMENT OF IMPELLA LEFT VENTRICULAR ASSIST DEVICE N/A 01/02/2020   Procedure: PLACEMENT OF IMPELLA LEFT VENTRICULAR ASSIST DEVICE;  Surgeon: [name], MD;  Location: [place];  Service: Open Heart Surgery;  Laterality: N/A;  . PLACEMENT OF IMPELLA LEFT VENTRICULAR ASSIST DEVICE Right 01/04/2020   Procedure: AXILLARY  ARTERY PLACEMENT OF ABIOMED IMPELLA  5.5 LEFT VENTRICULAR ASSIST DEVICE;  Surgeon: [name], MD;  Location: [place];  Service: Open Heart Surgery;  Laterality: Right;  . RIGHT/LEFT HEART CATH AND CORONARY ANGIOGRAPHY N/A 01/06/2020   Procedure: RIGHT/LEFT HEART CATH AND CORONARY ANGIOGRAPHY;  Surgeon: Larey Dresser, MD;  Location: [facility] CV [facility];  Service: Cardiovascular;  Laterality: N/A;  . TOTAL KNEE ARTHROPLASTY Left       Interim history/subjective:  Unable to extubate la patient was successfully extubated yesterday, after he came back from OR for displacement of Impala   Today he denies chest pain, shortness of breath or dizziness Objective   Blood pressure (!) 153/88, pulse 88, temperature (!) 97.34 F (36.3 C), resp. rate 16, height 6\' 3"  (1.905 m), weight 107 kg, SpO2 95 %. PAP: (27-50)/(12-22) 43/14 CVP:  [0 mmHg-11 mmHg] 0 mmHg PCWP:  [12 mmHg-15 mmHg] 12 mmHg CO:  [3.9 L/min-5 L/min] 5 L/min CI:  [1.7 L/min/m2-2.2 L/min/m2] 2.2 L/min/m2  Vent Mode: Stand-by FiO2 (%):  [40 %] 40 % Set Rate:  [12 bmp-18 bmp] 12 bmp Vt Set:  [670 mL] 670 mL PEEP:  [5 cmH20] 5 cmH20 Pressure Support:  [10 cmH20] 10 cmH20 Plateau Pressure:  [21 cmH20] 21 cmH20   Intake/Output Summary (Last 24 hours) at 01/10/2020 1412 Last data filed at 01/10/2020 1400 Gross per 24 hour  Intake 1626.77 ml  Output 1850 ml  Net -223.23 ml   Filed Weights   01/07/2020 0600 01/18/2020 0400 01/10/20 0500  Weight: 101.9 kg 104.6 kg 107 kg   PAP: (27-50)/(12-22) 43/14 CVP:  [0 mmHg-11 mmHg] 0 mmHg PCWP:  [12 mmHg-15 mmHg] 12 mmHg CO:  [3.9 L/min-5 L/min] 5 L/min CI:  [1.7 L/min/m2-2.2 L/min/m2] 2.2 L/min/m2  Examination: Physical exam: General: Chronically ill-appearing male, lying on the bed HEENT: [place]/AT, eyes anicteric.    Moist mucous membranes Neuro: Alert, awake, following commands.  Moving all 4 extremities spontaneously Chest: Coarse breath sounds, no wheezes or rhonchi Heart: Irregularly irregular, no murmurs or gallops Abdomen: Soft, nontender, nondistended, bowel sounds  present Skin: No rash  Ancillary tests (personally reviewed)  CBC: Recent Labs  Lab 01/06/20 0424 01/07/20 0319 01/20/2020 0425 12/29/2019 1448 01/15/2020 0428 01/11/2020 1127 01/18/2020 1130 01/22/2020 1149 01/10/20 0421  WBC 7.4 8.8 10.3  --  7.4  --   --   --  11.0*  HGB 14.6 15.2 15.8   < > 14.2 16.0 16.3 15.6 13.2  HCT 46.5 47.0 49.2   < > 41.5 47.0 48.0 46.0 39.2  MCV 83.6 80.8 80.8  --  79.2*  --   --   --  80.0  PLT 225 275 354  --  266  --   --   --  237   < > = values in this interval not displayed.    Basic Metabolic Panel: Recent Labs  Lab 01/06/20 0424 01/06/20 1657 01/07/20 0319 01/17/2020 0425 01/14/2020 1448 01/28/2020 1530 01/07/2020 1937 01/15/2020 0428 01/21/2020 0831 01/04/2020 1127 01/04/2020 1130 01/08/2020 1149 01/10/20 0421  NA 132* 130*  132* 133*   < > 135   < > 132*  --  133* 133* 133* 132*  K 3.0* 3.9 3.8 4.1   < > 3.9   < > 3.6  --  5.3* 5.2* 5.1 3.8  CL 80* 80* 82* 90*   < > 94*  --  98  --  100  --  100 100  CO2 35* 35* 35* 32  --   --   --  24  --   --   --   --  21*  GLUCOSE 312* 338* 229* 207*   < > 183*  --  150*  --  196*  --  236* 113*  BUN 40* 42* 47* 54*   < > 51*  --  42*  --  41*  --  41* 30*  CREATININE 1.77* 2.03* 2.06* 2.02*   < > 1.60*  --  1.55*  --  1.40*  --  1.40* 1.41*  CALCIUM 8.7* 9.0 8.8* 9.3  --   --   --  8.8*  --   --   --   --  8.5*  MG 1.9  --  2.1 2.2  --   --   --   --  1.7  --   --   --  1.7   < > = values in this interval not displayed.   GFR: Estimated Creatinine Clearance: 82 mL/min (A) (by C-G formula based on SCr of 1.41 mg/dL (H)). Recent Labs  Lab 01/07/20 0319 01/17/2020 0425 01/20/2020 0428 01/10/20 0421  WBC 8.8 10.3 7.4 11.0*    Liver Function Tests: No results for input(s): AST, ALT, ALKPHOS, BILITOT, PROT, ALBUMIN in the last 168 hours. No results for input(s): LIPASE, AMYLASE in the last 168 hours. No results for input(s): AMMONIA in the last 168 hours.  ABG    Component Value Date/Time   PHART 7.355  12/30/2019 1130   PCO2ART 47.0 01/02/2020 1130   PO2ART 258 (H) 01/01/2020 1130   HCO3 26.2 01/15/2020 1130   TCO2 22 01/25/2020 1149   ACIDBASEDEF 5.4 (H) 03/09/2008 0400   O2SAT 67.3 01/10/2020 0421     Coagulation Profile: No results for input(s): INR, PROTIME in the last 168 hours.  Cardiac Enzymes: No results for input(s): CKTOTAL, CKMB, CKMBINDEX, TROPONINI in the last 168 hours.  HbA1C: Hemoglobin A1C  Date/Time Value Ref Range Status  12/01/2019 04:04 PM 10.8 (A) 4.0 - 5.6 % Final  02/03/2019 04:24 PM 12.5 (A) 4.0 - 5.6 % Final   Hgb A1c MFr Bld  Date/Time Value Ref Range Status  01/03/2020 03:41 PM 10.6 (H) 4.8 - 5.6 % Final    Comment:    (NOTE) Pre diabetes:          5.7%-6.4%  Diabetes:              >6.4%  Glycemic control for   <7.0% adults with diabetes   10/01/2018 04:02 PM 11.9 (H) 4.8 - 5.6 % Final    Comment:    (NOTE) Pre diabetes:          5.7%-6.4% Diabetes:              >6.4% Glycemic control for   <7.0% adults with diabetes     CBG: Recent Labs  Lab 01/24/2020 2024 01/10/20 0005 01/10/20 0420 01/10/20 0804 01/10/20 1146  GLUCAP 138* 92 96 105* 112*    Assessment & Plan:  Acute hypoxic respiratory failure, improved Cardiogenic shock  Acute on chronic systolic heart failure requiring mechanical cardiac support with Impala and titration of inotropes Severe coronary artery disease Bipolar affective Diabetes type 2 Paroxysmal atrial fibrillation requiring amiodarone infusion.  Acute kidney injury-improving cardiorenal syndrome  Hyponatremia Hypomagnesemia  Patient was successfully extubated yesterday, currently on 2 L nasal cannula oxygen On Impala device, P8 Continue IV milrinone On Imdur and hydralazine combination Holding on diuresis Continue losartan Patient will go for CABG on Wednesday Continue aspirin and atorvastatin Continue buspirone, fluoxetine, Depakote and alprazolam Continue sliding scale insulin Patient's heart  rate is well controlled, continue amiodarone infusion and digoxin Patient serum creatinine is stable now, continue to monitor Supplement electrolytes aggressively  PCCM will sign off, please call with question Daily Goals Checklist  Pain/Anxiety/Delirium protocol (if indicated): As needed oxycodone VAP protocol (if indicated): N/A DVT prophylaxis: SCD GI prophylaxis: Protonix Glucose control: Basal and bolus insulin  Code Status: Full code Family Communication: Per primary team Disposition: ICU   Total critical care time: 32 minutes  Performed by: [place] care time was exclusive of separately billable procedures and treating other patients.   Critical care was necessary to treat or prevent imminent or life-threatening deterioration.   Critical care was time spent personally by me on the following activities: development of treatment plan with patient and/or surrogate as well as nursing, discussions with consultants, evaluation of patient's response to treatment, examination of patient, obtaining history from patient or surrogate, ordering and performing treatments and interventions, ordering and review of laboratory studies, ordering and review of radiographic studies, pulse oximetry and re-evaluation of patient's condition.   Jacky Kindle MD [facility] Pager: [phone] Mobile: [phone]

## 2020-01-10 NOTE — Progress Notes (Signed)
Patient ID: Corey Palmer, male   DOB: 04-27-1970, 49 y.o.   MRN: [id number]     Advanced Heart Failure Rounding Note  PCP-Cardiologist: No primary care provider on file.   Subjective:    - 12/7 Torsades -ICD shock x1.  - 12/11 Impella 5.5 placed.  - 12/12 Impella repositioned in OR  Main complaint this morning is pain at Impella site.  No significant hematoma formation.    He remains on milrinone 0.25 with NTG gtt 50 and heparin gtt.  SBP around 150 in setting of pain. Creatinine down to 1.4 after Impella placement.   He remains in NSR on amiodarone gtt.   Swan numbers: CVP 6 PA 34/14 CI 2.2 Co-ox 67%  Impella 5.5 P7 => P8 Flow 3.6 => 4.3 No alarms this morning  Echo (12/10): EF <20%, moderate LV dilation, normal RV size with mildly decreased systolic function.   RHC/LHC: Coronary Findings   Diagnostic Dominance: Right  Left Main  30% distal left main stenosis.  Left Anterior Descending  40% ostial LAD stenosis. 90% stenosis proximal LAD just distal to D1 take-off. Large D1 with 99% proximal stenosis, 60% mid vessel stenosis.  Left Circumflex  Long up to 70% proximal LCx stenosis.  Right Coronary Artery  95% mid RCA stenosis. 75% distal RCA stenosis. 60% stenosis proximal PLV. 99% stenosis mid PDA.   Intervention   No interventions have been documented.  Right Heart  Right Heart Pressures RHC Procedural Findings: Hemodynamics (mmHg) RA mean 16 RV 53/12 PA 65/29, mean 46 PCWP mean 31 LV 119/28 AO 118/87  Oxygen saturations: PA 45% AO 93%  Cardiac Output (Fick) 3.39  Cardiac Index (Fick) 1.4 PVR 4.4 WU PAPi 2.25     Objective:   Weight Range: 107 kg Body mass index is 29.48 kg/m.   Vital Signs:   Temp:  [97.3 F (36.3 C)-98.6 F (37 C)] 98.6 F (37 C) (12/13 0800) Pulse Rate:  [31-123] 80 (12/13 0800) Resp:  [0-28] 24 (12/13 0800) BP: (86-94)/(71-81) 94/81 (12/12 2000) SpO2:  [96 %-100 %] 100 % (12/13 0800) Arterial Line BP:  (82-141)/(59-90) 141/85 (12/13 0800) FiO2 (%):  [40 %] 40 % (12/12 1600) Weight:  [107 kg] 107 kg (12/13 0500) Last BM Date: 01/10/2020  Weight change: Filed Weights   01/22/2020 0600 01/20/2020 0400 01/10/20 0500  Weight: 101.9 kg 104.6 kg 107 kg    Intake/Output:   Intake/Output Summary (Last 24 hours) at 01/10/2020 0844 Last data filed at 01/10/2020 0700 Gross per 24 hour  Intake 2685.09 ml  Output 1840 ml  Net 845.09 ml      Physical Exam  CVP 9 General: Intubated Neck: No JVD, no thyromegaly or thyroid nodule.  Lungs: Mildly decreased at bases.  CV: Nondisplaced PMI.  Heart regular S1/S2, no S3/S4, no murmur.  No peripheral edema.   Abdomen: Soft, nontender, no hepatosplenomegaly, no distention.  Skin: Intact without lesions or rashes.  Neurologic: Alert and oriented x 3.  Psych: Normal affect. Extremities: No clubbing or cyanosis.  HEENT: Normal.    Telemetry    NSR 60s, personally reviewed   Labs    CBC Recent Labs    01/05/2020 0428 01/08/2020 1127 01/13/2020 1149 01/10/20 0421  WBC 7.4  --   --  11.0*  HGB 14.2   < > 15.6 13.2  HCT 41.5   < > 46.0 39.2  MCV 79.2*  --   --  80.0  PLT 266  --   --  237   < > =  values in this interval not displayed.   Basic Metabolic Panel Recent Labs    01/19/2020 0428 01/14/2020 0831 01/16/2020 1127 01/02/2020 1149 01/10/20 0421  NA 132*  --    < > 133* 132*  K 3.6  --    < > 5.1 3.8  CL 98  --    < > 100 100  CO2 24  --   --   --  21*  GLUCOSE 150*  --    < > 236* 113*  BUN 42*  --    < > 41* 30*  CREATININE 1.55*  --    < > 1.40* 1.41*  CALCIUM 8.8*  --   --   --  8.5*  MG  --  1.7  --   --  1.7   < > = values in this interval not displayed.   Liver Function Tests No results for input(s): AST, ALT, ALKPHOS, BILITOT, PROT, ALBUMIN in the last 72 hours. No results for input(s): LIPASE, AMYLASE in the last 72 hours. Cardiac Enzymes No results for input(s): CKTOTAL, CKMB, CKMBINDEX, TROPONINI in the last 72  hours.  BNP: BNP (last 3 results) Recent Labs    12/31/2019 1619  BNP 577.5*    ProBNP (last 3 results) No results for input(s): PROBNP in the last 8760 hours.   D-Dimer No results for input(s): DDIMER in the last 72 hours. Hemoglobin A1C No results for input(s): HGBA1C in the last 72 hours. Fasting Lipid Panel No results for input(s): CHOL, HDL, LDLCALC, TRIG, CHOLHDL, LDLDIRECT in the last 72 hours. Thyroid Function Tests No results for input(s): TSH, T4TOTAL, T3FREE, THYROIDAB in the last 72 hours.  Invalid input(s): FREET3  Other results:   Imaging    DG Chest 1 View  Result Date: 01/10/2020 CLINICAL DATA:  Chest pain.  Status post extubation EXAM: CHEST  1 VIEW COMPARISON:  January 09, 2020 FINDINGS: Endotracheal tube has been removed. Impella device unchanged in position. Pacemaker lead attached to right ventricle. Swan-Ganz catheter tip is in the right main pulmonary artery. No appreciable pneumothorax. There is no edema or airspace opacity. There is cardiomegaly with pulmonary vascularity normal. No adenopathy. No bone lesions. IMPRESSION: Tube and catheter positions as described without pneumothorax. Stable cardiomegaly. No edema or airspace opacity. Electronically Signed   By: Lowella Grip III M.D.   On: 01/10/2020 08:21   DG Chest 1 View  Result Date: 01/08/2020 CLINICAL DATA:  History of open-heart surgery EXAM: CHEST  1 VIEW COMPARISON:  01/13/2020 and prior FINDINGS: Stable support lines and tubes. No pneumothorax or pleural effusion. Cardiomegaly and central pulmonary vascular congestion, unchanged. Minimal bibasilar opacities are also unchanged. IMPRESSION: Unchanged appearance of the chest. Stable support lines and tubes. Electronically Signed   By: Primitivo Gauze M.D.   On: 01/19/2020 13:56   DG Fluoro Guide CV Line-No Report  Result Date: 01/21/2020 Fluoroscopy was utilized by the requesting physician.  No radiographic interpretation.   ECHO  INTRAOPERATIVE TEE  Result Date: 01/26/2020  *INTRAOPERATIVE TRANSESOPHAGEAL REPORT *  [name] Date of Exam: 12/29/2019 Medical Rec #:  [id number]     Height:       75.0 in Accession #:    [id number]    Weight:       230.6 lb Date of Birth:  [birth year]     BSA:          2.33 m Patient Age:    66 years  BP:           0/0 mmHg Patient Gender: M             HR:           80 bpm. Exam Location:  Inpatient Transesophogeal exam was perform intraoperatively during surgical procedure. Patient was closely monitored under general anesthesia during the entirety of examination. Indications:     Impella positioning Sonographer:     Clayton Lefort RDCS (AE) Performing Phys: [id number] GREGORY P STOLTZFUS Diagnosing Phys: Belenda Cruise Stoltzfus Complications: No known complications during this procedure. POST-OP IMPRESSIONS TEE for impella repositioning. No new or worsening valvular or wall motion abnormalities. Persistent severe global hypokinesis with estimated EF <20%. Cannula placed appropriately into LV with ~5cm distance into LV from aortic valve annulus free of LV wall obstruction. PRE-OP FINDINGS  Left Ventricle: The left ventricle has severely reduced systolic function, with an ejection fraction of <20%. The cavity size was severely dilated. There is no increase in left ventricular wall thickness. Left ventrical global hypokinesis without regional wall motion abnormalities. Right Ventricle: The right ventricle has severely reduced systolic function. The cavity was moderately enlarged. There is no increase in right ventricular wall thickness. Right ventricular systolic pressure is moderately elevated. Left Atrium: Left atrial size was dilated. The left atrial appendage is well visualized and there is no evidence of thrombus present. Left atrial appendage velocity is normal at greater than 40 cm/s. Right Atrium: Right atrial size was dilated. Interatrial Septum: No atrial level shunt detected by color flow  Doppler. Pericardium: There is no evidence of pericardial effusion. There is no pleural effusion. Mitral Valve: The mitral valve is dilated. No thickening of the mitral valve leaflet. No calcification of the mitral valve leaflet. Mitral valve regurgitation is moderate by color flow Doppler. The MR jet is centrally-directed. There is no evidence of mitral valve vegetation. There is No evidence of mitral stenosis. Tricuspid Valve: The tricuspid valve was normal in structure. Tricuspid valve regurgitation is mild by color flow Doppler. There is no evidence of tricuspid valve vegetation. Aortic Valve: The aortic valve is tricuspid Aortic valve regurgitation was not visualized by color flow Doppler. There is no stenosis of the aortic valve, with a calculated valve area of 1.76 cm. There is no evidence of a vegetation on the aortic valve. Pulmonic Valve: The pulmonic valve was normal in structure No evidence of pumonic stenosis. Pulmonic valve regurgitation is not visualized by color flow Doppler. Aorta: The aortic root and ascending aorta are normal in size and structure. Pulmonary Artery: The pulmonary artery is moderately dilated. Pulmonary hypertension is moderate. Venous: The inferior vena cava is dilated in size with less than 50% respiratory variability, suggesting right atrial pressure of 15 mmHg. +--------------+--------++ LEFT VENTRICLE         +--------------+--------++ PLAX 2D                +--------------+--------++ LVOT diam:    2.40 cm  +--------------+--------++ LVOT Area:    4.52 cm +--------------+--------++                        +--------------+--------++ +------------------+-----------++ AORTIC VALVE                  +------------------+-----------++ AV Area (Vmax):   1.89 cm    +------------------+-----------++ AV Area (Vmean):  1.73 cm    +------------------+-----------++ AV Area (VTI):    1.76 cm    +------------------+-----------++ AV Vmax:  86.10 cm/s  +------------------+-----------++ AV Vmean:         58.800 cm/s +------------------+-----------++ AV VTI:           0.136 m     +------------------+-----------++ AV Peak Grad:     3.0 mmHg    +------------------+-----------++ AV Mean Grad:     2.0 mmHg    +------------------+-----------++ LVOT Vmax:        36.00 cm/s  +------------------+-----------++ LVOT Vmean:       22.500 cm/s +------------------+-----------++ LVOT VTI:         0.053 m     +------------------+-----------++ LVOT/AV VTI ratio:0.39        +------------------+-----------++  +--------------+-------++ AORTA                 +--------------+-------++ Ao Sinus diam:3.21 cm +--------------+-------++ Ao STJ diam:  2.8 cm  +--------------+-------++ Ao Asc diam:  2.80 cm +--------------+-------++ +--------------+----------++ MITRAL VALVE                +--------------+-------+ +--------------+----------++    SHUNTS                MV Area (PHT):5.97 cm      +--------------+-------+ +--------------+----------++    Systemic VTI: 0.05 m  MV Peak grad: 2.3 mmHg      +--------------+-------+ +--------------+----------++    Systemic Diam:2.40 cm MV Mean grad: 1.0 mmHg      +--------------+-------+ +--------------+----------++ MV Vmax:      0.76 m/s   +--------------+----------++ MV Vmean:     31.6 cm/s  +--------------+----------++ MV VTI:       0.16 m     +--------------+----------++ MV PHT:       36.83 msec +--------------+----------++ MV Decel Time:127 msec   +--------------+----------++ +----------------+-----------++ MR Peak grad:   56.7 mmHg   +----------------+-----------++ MR Mean grad:   28.0 mmHg   +----------------+-----------++ MR Vmax:        376.50 cm/s +----------------+-----------++ MR Vmean:       234.0 cm/s  +----------------+-----------++ MR PISA:        2.26 cm    +----------------+-----------++ MR PISA Eff ROA:22  mm      +----------------+-----------++ MR PISA Radius: 0.60 cm     +----------------+-----------++ +--------------+----------++ MV E velocity:73.50 cm/s +--------------+----------++ MV A velocity:34.60 cm/s +--------------+----------++ MV E/A ratio: 2.12       +--------------+----------++  Rochele Pages Electronically signed by Rochele Pages Signature Date/Time: 01/26/2020/5:52:30 PM    Final    ECHOCARDIOGRAM LIMITED  Result Date: 01/16/2020    ECHOCARDIOGRAM LIMITED REPORT   [name] Date of Exam: 01/18/2020 Medical Rec #:  [id number]     Height:       75.0 in Accession #:    [id number]    Weight:       230.6 lb Date of Birth:  05/09/1970     BSA:          2.331 m Patient Age:    60 years      BP:           99/85 mmHg Patient Gender: M             HR:           55 bpm. Exam Location:  Inpatient Procedure: Limited Echo and Color Doppler Indications:    Impella placement  History:        Patient has prior history of Echocardiogram examinations, most  recent 01/03/2020. CHF, Defibrillator, Arrythmias:Atrial                 Fibrillation; Risk Factors:Hypertension, Diabetes and                 Dyslipidemia.  Sonographer:    Clayton Lefort RDCS (AE) Referring Phys: [place]  Sonographer Comments: Echo performed with patient supine and on artificial respirator. IMPRESSIONS  1. Severe LVE EF 15% diffuse hypokinesis Mild RVE Severe LAE trivial MR no effuson Impellar device apparently repositioned in OR now about 6 cm distal to aortic annulus No obvious impingement on LV free wall. FINDINGS  Additional Comments: Severe LVE EF 15% diffuse hypokinesis Mild RVE Severe LAE trivial MR no effuson Impellar device apparently repositioned in OR now about 6 cm distal to aortic annulus No obvious impingement on LV free wall. Jenkins Rouge MD Electronically signed by Jenkins Rouge MD Signature Date/Time: 01/07/2020/11:29:44 AM    Final      Medications:      Scheduled Medications: . sodium chloride   Intravenous Once  . atorvastatin  80 mg Oral Daily  . busPIRone  10 mg Oral TID  . Chlorhexidine Gluconate Cloth  6 each Topical Daily  . colchicine  0.6 mg Oral Daily  . dapagliflozin propanediol  10 mg Oral Daily  . dicyclomine  10 mg Oral TID AC  . digoxin  0.125 mg Oral Daily  . divalproex  500 mg Oral QHS  . FLUoxetine  40 mg Oral BID  . insulin aspart  0-15 Units Subcutaneous Q4H  . insulin glargine  10 Units Subcutaneous Daily  . isosorbide-hydrALAZINE  1 tablet Oral Q8H  . lidocaine  1 patch Transdermal Q24H  . magnesium oxide  200 mg Oral Daily  . mouth rinse  15 mL Mouth Rinse BID  .  morphine injection  4 mg Intravenous Once  . sodium chloride flush  10-40 mL Intracatheter Q12H  . sodium chloride flush  10-40 mL Intracatheter Q12H  . sodium chloride flush  3 mL Intravenous Q12H  . spironolactone  12.5 mg Oral Daily    Infusions: . sodium chloride Stopped (01/06/20 1204)  . albumin human Stopped (01/26/2020 1434)  . amiodarone 30 mg/hr (01/10/20 0600)  . heparin 750 Units/hr (01/10/20 0615)  . impella catheter heparin 50 unit/mL in dextrose 5%    . magnesium sulfate bolus IVPB    . milrinone 0.25 mcg/kg/min (01/10/20 0601)    PRN Medications: sodium chloride, sodium chloride, acetaminophen, albumin human, fentaNYL (SUBLIMAZE) injection, ondansetron (ZOFRAN) IV, oxyCODONE-acetaminophen, sodium chloride flush, sodium chloride flush, traMADol     Assessment/Plan   1. Atrial fibrillation: H/o PAF.  He was on dofetilide in the remote past but this was stopped due to noncompliance. He had an upper GI bleed from antral ulcers in 3/12. He was seen by GI and was deemed safe to restart anticoagulation as long as he remains on a PPI.  No apparent recurrence of AF until just prior to this admission, was cardioverted back to NSR in ER but went back into atrial fibrillation.  Now he has returned to NSR.  - Continue amiodarone gtt  30 mg/hr.  - Continue heparin gtt.  - Maze with CABG.  2. CAD: Nonobstructive mild CAD on remote cath in 2002. He has had chest pain but is pleuritic.  HS-TnI 64 => 92, suspect demand ischemia with volume overload and afib/RVR.   Cath this admission with severe 3 vessel disease, ideally treated by CABG  given extent of disease.  If no CABG, could approach with multivessel PCI but would be complex and need Impella support.  - Continue atorvastatin, ASA.  - Dr. [name] consulting, current plan for high-risk CABG Wednesday, supported by Impella 5.5.  - Unable to do cardiac MRI for viability given old ICD leads.  However, no thinning of the myocardium noted.  3. Acute on chronic systolic CHF: Initially nonischemic cardiomyopathy. Possible familial cardiomyopathy as both parents had cardiomyopathy and died at around 32.However, [employer] gene testing did not show any common mutation for cardiomyopathy.  However, this admission noted to have severe 3 vessel disease so suspect component of ischemic cardiomyopathy. [employer] Jude ICD. Echo in 8/20 with EF 15% and mildly decreased RV function. Echo this admission with EF < 20%, moderate LV dilation, RV mildly reduced, severe LAE, no significant MR.Low output HF with markedly low EF.  He has a long history of cardiomyopathy (20 yrs), tends to minimize symptoms.  Cardiorenal syndrome with creatinine up to 2.2, now stabilized on milrinone and Impella 5.5 with creatinine down to 1.4.  This morning, Co-ox 67% with CVP 6, creatinine down to 1.4.  CI 2.2.  - Increased Impella to P8.  Heparin gtt running, LDH 203. Echo today for Impella position.  - Control MAP, will try to wean from NTG gtt (suspect tachyphylaxis at this point) to Bidil 1 tab tid and spironolactone 25 daily.  - Continue milrinone 0.25, can wean off if cardiac output stabilizes on Impella.  - Hold diuretics today, aim for CVP around 10 (6 currently).  - Continue dig 0.125 mg daily. Level ok recently.     -  Continue dapagliflozin.  - Coreg held with low output.   - Continue Unna boots.  - As above, considering high risk CABG with severe 3VD => possibly Wednesday.  Alternatively, may need to consider directly to LVAD if thought too high risk.  RV function is reasonable, at most mildly hypokinetic on echo 12/10.  4. OSA: OSA on sleep study, has not been using CPAP.  - Start CPAP here.  5. AKI on CKD stage ?3: Last creatinine prior to admission was 1.38.  Creatinine peaked 2.2 this admission.  Suspect cardiorenal syndrome. Creatinine down to 1.4 with Impella.   - Holding Lasix with CVP 6.   6. Diabetes: hgbA1c 10.6.  - Insulin sliding scale.  - Consulted diabetes coordinator.  - Followed by Dr Loanne Drilling.  7. Hyponatremia: Hypervolemic hyponatremia: Fluid restrict.  8. Torsades/ICD shock: 12/7/212 in hospital event, in setting of severe hypokalemia and hypomagnesemia.  - Follow K and Mg closely.  - Continue IV amio  9. Gout: h/o gout. Complains of rt knee pain c/w previous flares - treated w/ prednisone burst (completed).  - Continue colchicine 0.6 (takes with relief at home).  10. Right shoulder pain: Impella site, no significant hematoma noted.  - Pain control.   CRITICAL CARE Performed by: Loralie Champagne  Total critical care time: 35 minutes  Critical care time was exclusive of separately billable procedures and treating other patients.  Critical care was necessary to treat or prevent imminent or life-threatening deterioration.  Critical care was time spent personally by me on the following activities: development of treatment plan with patient and/or surrogate as well as nursing, discussions with consultants, evaluation of patient's response to treatment, examination of patient, obtaining history from patient or surrogate, ordering and performing treatments and interventions, ordering and review of laboratory studies, ordering and review of radiographic studies, pulse oximetry and  re-evaluation of patient's condition.  Loralie Champagne 01/10/2020 8:44 AM

## 2020-01-10 NOTE — Progress Notes (Signed)
[place] for heparin Indication: Impella 5.5  Allergies  Allergen Reactions   Orange Fruit Anaphylaxis, Hives and Other (See Comments)    Per Pt- Blisters around lips and Hives all over, also   Penicillins Anaphylaxis    Did it involve swelling of the face/tongue/throat, SOB, or low BP? Yes Did it involve sudden or severe rash/hives, skin peeling, or any reaction on the inside of your mouth or nose? Yes Did you need to seek medical attention at a hospital or doctor's office? No When did it last happen?childhood If all above answers are NO, may proceed with cephalosporin use.   Jardiance [Empagliflozin] Itching   Basaglar Kwikpen [Insulin Glargine] Nausea And Vomiting    Patient Measurements: Height: 6\' 3"  (190.5 cm) Weight: 107 kg (235 lb 14.3 oz) IBW/kg (Calculated) : 84.5 Heparin dosing wt: 101 kg  Vital Signs: Temp: 97.34 F (36.3 C) (12/13 1400) Temp Source: Core (12/13 0600) BP: 153/88 (12/13 1214) Pulse Rate: 88 (12/13 1400)  Labs: Recent Labs    01/03/2020 0425 01/19/2020 1448 01/15/2020 0428 01/12/2020 0616 01/28/2020 1127 01/17/2020 1130 12/30/2019 1149 01/24/2020 2021 01/10/20 0421 01/10/20 1332  HGB 15.8   < > 14.2  --  16.0 16.3 15.6  --  13.2  --   HCT 49.2   < > 41.5  --  47.0 48.0 46.0  --  39.2  --   PLT 354  --  266  --   --   --   --   --  237  --   HEPARINUNFRC 0.45   < >  --    < >  --   --   --  0.16* <0.10* <0.10*  CREATININE 2.02*   < > 1.55*  --  1.40*  --  1.40*  --  1.41*  --    < > = values in this interval not displayed.    Estimated Creatinine Clearance: 82 mL/min (A) (by C-G formula based on SCr of 1.41 mg/dL (H)).   Assessment: 49 yo male s/p impella 5.5 placement. Heparin via purge pump at 5 ml/hr (250 units/hr), -Systemic heparin at 750 units/hr = total heparin 1000 uts/hr  -heparin level < 0.1 undetectable  Required heparin drip 1600 uts/hr to be therapeutic prior to impella placement No  bleeding note, h/h stable NSR currently on amiodarone drip 30mg /hr Will increase heparin drip slightly for planned CABG this week  Goal of Therapy:  Heparin level 0.2-0.5 units/ml Monitor platelets by anticoagulation protocol: Yes   Plan:  Increase systemic heparin to 800 units/hr  Heparin level daily,     Bonnita Nasuti Pharm.D. CPP, BCPS Clinical Pharmacist [phone] 01/10/2020 3:06 PM    **Pharmacist phone directory can now be found on [email or web address] ([email or web address]).  Listed under [facility].

## 2020-01-10 NOTE — Plan of Care (Signed)
Patient educated on importance of incentive spirometer.

## 2020-01-10 NOTE — Progress Notes (Signed)
   01/10/20 0645  Art Line  Arterial Line BP 126/90  Arterial Line MAP (mmHg) 98 mmHg   Pt stating shoulder pain, see EMAR for PRN medication given. Pt repositioned for comfort.

## 2020-01-10 NOTE — Op Note (Signed)
Procedure(s): PLACEMENT OF IMPELLA LEFT VENTRICULAR ASSIST DEVICE Procedure Note  WENCESLAO LOPER male 49 y.o. 01/12/2020 Procedure(s) and Anesthesia Type:    * PLACEMENT OF IMPELLA LEFT VENTRICULAR ASSIST DEVICE - General  Surgeon(s) and Role:    * [name], MD - Primary   Indications: The patient was admitted to the hospital with a brief history of heart failure.  He has been tried on a regimen of intravenous inotropes but his serum creatinine continues to rise.  He is taken the operating room for Impella LVAD support.     Surgeon: [name]   Assistants: Staff  Anesthesia: General endotracheal anesthesia  ASA Class: 5    Procedure Detail  PLACEMENT OF IMPELLA LEFT VENTRICULAR ASSIST DEVICE After informed consent, the patient was taken to the operating room on the above listed date.  He is placed in the supine position on the operating table.  Anesthesia was begun in a smooth fashion with a general endotracheal technique.  After confirming adequate anesthesia the anterior chest and abdomen were cleansed and draped in sterile fusion Betadine solution.  A preop surgical pause was performed.  An incision was made overlying the right deltopectoral groove.  Dissection carried down the subcutaneous tissue with electrocautery.  The pectoralis major minor muscles were divided with.  The right axillary artery was encountered and encircled.  7000 units of heparin were given intravenously.  A 10 mm Hemashield gold graft was sewn end-to-side to the axillary artery to use as a conduit through which the Impella device was inserted.  Using fluoroscopic and TEE guidance a wire was advanced across the aortic valve into the ventricle.  This was then followed by the Impella pump in a modified Seldinger technique.  Positioning was confirmed with fluoroscopy and with TEE.  The wire within the ventricle was then withdrawn the pump was turned on.  The device was then secured at the skin.  The  incision in the right deltopectoral groove was repaired in layers.  Sterile dressings were applied.  All sponge instruments and needle counts were correct.  Estimated Blood Loss:  Minimal         Drains: no         Blood Given: none          Specimens: no         Implants: none        Complications:  * No complications entered in OR log *         Disposition: ICU - intubated and critically ill.         Condition: stable

## 2020-01-10 NOTE — Progress Notes (Signed)
Overnight progress  Pt reported general discomfort with right shoulder. Pulses unchanged. Pt moving arms for drinking water, using cell phone. Pt encouraged to rest arm to decrease pain. Pt repositioned, ice pack applied to back of shoulder for comfort,  See EMAR for PRN Ultram and Tylenol given overnight.  Impella had no issues overnight. No bleeding. Impella @ 48. P-level 7 over night.   Access: Left FA PIV  L radial art line R IJ SWAN R Upper arm picc  Drips Amiodarone @ 30 mg/hr Heparin @ 750 units/hour Milrinone @ .25 mcg/kg/min Nitro @ 60 mcg/min this am due to pain. See VS. Pt on Precedex overnight to assist patient with rest.   HR 50-60's CI 2-2.1 CO 5 PA 20-30/10-20's

## 2020-01-10 NOTE — Progress Notes (Signed)
1 Day Post-Op Procedure(s) (LRB): PLACEMENT OF IMPELLA LEFT VENTRICULAR ASSIST DEVICE (N/A) Subjective: Right shoulder pain  Objective: Vital signs in last 24 hours: Temp:  [97.3 F (36.3 C)-98.6 F (37 C)] 98.6 F (37 C) (12/13 0800) Pulse Rate:  [31-123] 80 (12/13 0800) Cardiac Rhythm: Sinus bradycardia;Bundle branch block (12/13 0400) Resp:  [0-28] 24 (12/13 0800) BP: (86-94)/(71-81) 94/81 (12/12 2000) SpO2:  [96 %-100 %] 100 % (12/13 0800) Arterial Line BP: (82-141)/(59-90) 141/85 (12/13 0800) FiO2 (%):  [40 %] 40 % (12/12 1600) Weight:  [107 kg] 107 kg (12/13 0500)  Hemodynamic parameters for last 24 hours: PAP: (27-48)/(12-22) 44/21 CVP:  [5 mmHg-15 mmHg] 5 mmHg PCWP:  [12 mmHg-15 mmHg] 12 mmHg CO:  [3.9 L/min-6.7 L/min] 5 L/min CI:  [1.7 L/min/m2-2.9 L/min/m2] 2.2 L/min/m2  Intake/Output from previous day: 12/12 0701 - 12/13 0700 In: 2826.5 [P.O.:220; I.V.:1862.1; IV Piggyback:645.9] Out: 2015 [Urine:1815; Blood:200] Intake/Output this shift: No intake/output data recorded.  General appearance: alert and cooperative Neurologic: intact Heart: regular rate and rhythm, S1, S2 normal, no murmur, click, rub or gallop Lungs: clear to auscultation bilaterally Abdomen: soft, non-tender; bowel sounds normal; no masses,  no organomegaly Extremities: edema mild Wound: c/d/i  Lab Results: Recent Labs    01/24/2020 0428 01/07/2020 1127 01/13/2020 1149 01/10/20 0421  WBC 7.4  --   --  11.0*  HGB 14.2   < > 15.6 13.2  HCT 41.5   < > 46.0 39.2  PLT 266  --   --  237   < > = values in this interval not displayed.   BMET:  Recent Labs    01/03/2020 0428 12/29/2019 1127 01/02/2020 1149 01/10/20 0421  NA 132*   < > 133* 132*  K 3.6   < > 5.1 3.8  CL 98   < > 100 100  CO2 24  --   --  21*  GLUCOSE 150*   < > 236* 113*  BUN 42*   < > 41* 30*  CREATININE 1.55*   < > 1.40* 1.41*  CALCIUM 8.8*  --   --  8.5*   < > = values in this interval not displayed.    PT/INR: No  results for input(s): LABPROT, INR in the last 72 hours. ABG    Component Value Date/Time   PHART 7.355 01/23/2020 1130   HCO3 26.2 01/08/2020 1130   TCO2 22 01/10/2020 1149   ACIDBASEDEF 5.4 (H) 03/09/2008 0400   O2SAT 67.3 01/10/2020 0421   CBG (last 3)  Recent Labs    01/23/2020 2024 01/10/20 0005 01/10/20 0804  GLUCAP 138* 92 105*    Assessment/Plan: S/P Procedure(s) (LRB): PLACEMENT OF IMPELLA LEFT VENTRICULAR ASSIST DEVICE (N/A) Mobilize Diuresis pain control  Tentatively plan CABG with perioperative Impella support on Wed   LOS: 11 days    [name] 01/10/2020

## 2020-01-10 NOTE — Progress Notes (Signed)
Orthopedic Tech Progress Note Patient Details:  Corey Palmer Jun 28, 1970 [id number]  Had to remove unna boots upon arrival because nurse did not.  Ortho Devices Type of Ortho Device: Haematologist Ortho Device/Splint Location: Bilateral Lower Extremity Ortho Device/Splint Interventions: Ordered,Application,Removal   Post Interventions Patient Tolerated: Well Instructions Provided: Adjustment of device,Care of device,Poper ambulation with device   Jeb Schloemer P Lorel Monaco 01/10/2020, 12:26 PM

## 2020-01-10 NOTE — Progress Notes (Signed)
[place] for heparin Indication: Impella 5.5  Allergies  Allergen Reactions  . Orange Fruit Anaphylaxis, Hives and Other (See Comments)    Per Pt- Blisters around lips and Hives all over, also  . Penicillins Anaphylaxis    Did it involve swelling of the face/tongue/throat, SOB, or low BP? Yes Did it involve sudden or severe rash/hives, skin peeling, or any reaction on the inside of your mouth or nose? Yes Did you need to seek medical attention at a hospital or doctor's office? No When did it last happen?childhood If all above answers are "NO", may proceed with cephalosporin use.  [name] [Empagliflozin] Itching  . Basaglar Kwikpen [Insulin Glargine] Nausea And Vomiting    Patient Measurements: Height: 6\' 3"  (190.5 cm) Weight: 104.6 kg (230 lb 9.6 oz) IBW/kg (Calculated) : 84.5 Heparin dosing wt: 101 kg  Vital Signs: Temp: 98.1 F (36.7 C) (12/13 0530) Temp Source: Core (12/13 0400) BP: 94/81 (12/12 2000) Pulse Rate: 62 (12/13 0530)  Labs: Recent Labs    01/17/2020 0425 01/17/2020 1448 01/18/2020 0428 01/14/2020 0616 01/27/2020 1127 01/28/2020 1130 01/11/2020 1149 01/14/2020 2021 01/10/20 0421  HGB 15.8   < > 14.2  --  16.0 16.3 15.6  --  13.2  HCT 49.2   < > 41.5  --  47.0 48.0 46.0  --  39.2  PLT 354  --  266  --   --   --   --   --  237  HEPARINUNFRC 0.45   < >  --  <0.10*  --   --   --  0.16* <0.10*  CREATININE 2.02*   < > 1.55*  --  1.40*  --  1.40*  --  1.41*   < > = values in this interval not displayed.    Estimated Creatinine Clearance: 81.1 mL/min (A) (by C-G formula based on SCr of 1.41 mg/dL (H)).   Assessment: 49 yo male s/p impella 5.5 placement. Heparin via purge pump 5.1 ml/hr -Systemic heparin at 600 units/hr -heparin level= <0.1 units/ml Hg 13.2, PTLC 237  Goal of Therapy:  Heparin level 0.2-0.5 units/ml Monitor platelets by anticoagulation protocol: Yes   Plan:  Increase systemic heparin to 750 units/hr   Heparin level in 6 hours  Thanks for allowing pharmacy to be a part of this patient's care.  Excell Seltzer, PharmD Clinical Pharmacist

## 2020-01-11 ENCOUNTER — Inpatient Hospital Stay (HOSPITAL_COMMUNITY): Payer: BC Managed Care – PPO

## 2020-01-11 ENCOUNTER — Other Ambulatory Visit (HOSPITAL_COMMUNITY): Payer: Self-pay | Admitting: Psychiatry

## 2020-01-11 LAB — URINALYSIS, ROUTINE W REFLEX MICROSCOPIC
Bilirubin Urine: NEGATIVE
Glucose, UA: >=500 mg/dL — AB
Ketones, ur: NEGATIVE mg/dL
Nitrite: NEGATIVE
Protein, ur: 30 mg/dL — AB
Specific Gravity, Urine: 1.021 (ref 1.005–1.030)
pH: 5 (ref 5.0–8.0)

## 2020-01-11 LAB — CBC
HCT: 40.8 % (ref 39.0–52.0)
Hemoglobin: 13.2 g/dL (ref 13.0–17.0)
MCH: 26.3 pg (ref 26.0–34.0)
MCHC: 32.4 g/dL (ref 30.0–36.0)
MCV: 81.3 fL (ref 80.0–100.0)
Platelets: 254 10*3/uL (ref 150–400)
RBC: 5.02 MIL/uL (ref 4.22–5.81)
RDW: 15.3 % (ref 11.5–15.5)
WBC: 14.1 10*3/uL — ABNORMAL HIGH (ref 4.0–10.5)
nRBC: 0 % (ref 0.0–0.2)

## 2020-01-11 LAB — LACTATE DEHYDROGENASE: LDH: 263 U/L — ABNORMAL HIGH (ref 98–192)

## 2020-01-11 LAB — GLUCOSE, CAPILLARY
Glucose-Capillary: 160 mg/dL — ABNORMAL HIGH (ref 70–99)
Glucose-Capillary: 147 mg/dL — ABNORMAL HIGH (ref 70–99)
Glucose-Capillary: 143 mg/dL — ABNORMAL HIGH (ref 70–99)
Glucose-Capillary: 161 mg/dL — ABNORMAL HIGH (ref 70–99)
Glucose-Capillary: 194 mg/dL — ABNORMAL HIGH (ref 70–99)
Glucose-Capillary: 174 mg/dL — ABNORMAL HIGH (ref 70–99)

## 2020-01-11 LAB — COOXEMETRY PANEL
Carboxyhemoglobin: 1.5 % (ref 0.5–1.5)
Carboxyhemoglobin: 1.7 % — ABNORMAL HIGH (ref 0.5–1.5)
Methemoglobin: 0.9 % (ref 0.0–1.5)
Methemoglobin: 1 % (ref 0.0–1.5)
O2 Saturation: 82 %
O2 Saturation: 71.1 %
Total hemoglobin: 13 g/dL (ref 12.0–16.0)
Total hemoglobin: 12.8 g/dL (ref 12.0–16.0)

## 2020-01-11 LAB — BASIC METABOLIC PANEL
Anion gap: 13 (ref 5–15)
BUN: 22 mg/dL — ABNORMAL HIGH (ref 6–20)
CO2: 22 mmol/L (ref 22–32)
Calcium: 8.8 mg/dL — ABNORMAL LOW (ref 8.9–10.3)
Chloride: 98 mmol/L (ref 98–111)
Creatinine, Ser: 1.36 mg/dL — ABNORMAL HIGH (ref 0.61–1.24)
GFR, Estimated: >60 mL/min (ref >60)
Glucose, Bld: 145 mg/dL — ABNORMAL HIGH (ref 70–99)
Potassium: 3.7 mmol/L (ref 3.5–5.1)
Sodium: 133 mmol/L — ABNORMAL LOW (ref 135–145)

## 2020-01-11 LAB — MAGNESIUM: Magnesium: 1.9 mg/dL (ref 1.7–2.4)

## 2020-01-11 LAB — HEPARIN LEVEL (UNFRACTIONATED): Heparin Unfractionated: 0.1 IU/mL — ABNORMAL LOW (ref 0.30–0.70)

## 2020-01-11 MED ORDER — TRANEXAMIC ACID (OHS) PUMP PRIME SOLUTION
2.0000 mg/kg | INTRAVENOUS | Status: DC
  Filled 2020-01-11: qty 2.14

## 2020-01-11 MED ORDER — EPINEPHRINE HCL 5 MG/250ML IV SOLN IN NS
0.0000 ug/min | INTRAVENOUS | Status: DC
  Filled 2020-01-11: qty 250

## 2020-01-11 MED ORDER — CHLORHEXIDINE GLUCONATE 0.12 % MT SOLN
15.0000 mL | Freq: Once | OROMUCOSAL | Status: AC
  Administered 2020-01-12: 05:00:00 15 mL via OROMUCOSAL
  Filled 2020-01-11: qty 15

## 2020-01-11 MED ORDER — CHLORHEXIDINE GLUCONATE CLOTH 2 % EX PADS
6.0000 | MEDICATED_PAD | Freq: Once | CUTANEOUS | Status: AC
  Administered 2020-01-12: 05:00:00 6 via TOPICAL

## 2020-01-11 MED ORDER — CHLORHEXIDINE GLUCONATE CLOTH 2 % EX PADS
6.0000 | MEDICATED_PAD | Freq: Once | CUTANEOUS | Status: AC
  Administered 2020-01-11: 23:00:00 6 via TOPICAL

## 2020-01-11 MED ORDER — DEXMEDETOMIDINE HCL IN NACL 400 MCG/100ML IV SOLN
0.1000 ug/kg/h | INTRAVENOUS | Status: DC
  Filled 2020-01-11: qty 100

## 2020-01-11 MED ORDER — HYDRALAZINE HCL 50 MG PO TABS
75.0000 mg | ORAL_TABLET | Freq: Three times a day (TID) | ORAL | Status: DC
Start: 2020-01-11 — End: 2020-01-11

## 2020-01-11 MED ORDER — MAGNESIUM SULFATE 50 % IJ SOLN
40.0000 meq | INTRAMUSCULAR | Status: DC
  Filled 2020-01-11: qty 9.85

## 2020-01-11 MED ORDER — VANCOMYCIN HCL 1500 MG/300ML IV SOLN
1500.0000 mg | INTRAVENOUS | Status: AC
  Administered 2020-01-12: 09:00:00 1500 mg via INTRAVENOUS
  Filled 2020-01-11: qty 300

## 2020-01-11 MED ORDER — LEVOFLOXACIN IN D5W 500 MG/100ML IV SOLN
500.0000 mg | INTRAVENOUS | Status: AC
  Administered 2020-01-12: 09:00:00 500 mg via INTRAVENOUS
  Filled 2020-01-11: qty 100

## 2020-01-11 MED ORDER — OXYMETAZOLINE HCL 0.05 % NA SOLN
1.0000 | Freq: Two times a day (BID) | NASAL | Status: DC | PRN
  Filled 2020-01-11: qty 30

## 2020-01-11 MED ORDER — PLASMA-LYTE 148 IV SOLN
INTRAVENOUS | Status: DC
  Filled 2020-01-11: qty 2.5

## 2020-01-11 MED ORDER — NOREPINEPHRINE 4 MG/250ML-% IV SOLN
0.0000 ug/min | INTRAVENOUS | Status: DC
  Filled 2020-01-11: qty 250

## 2020-01-11 MED ORDER — NITROGLYCERIN IN D5W 200-5 MCG/ML-% IV SOLN
2.0000 ug/min | INTRAVENOUS | Status: DC
  Filled 2020-01-11: qty 250

## 2020-01-11 MED ORDER — SALINE SPRAY 0.65 % NA SOLN
1.0000 | NASAL | Status: DC | PRN
  Filled 2020-01-11: qty 44

## 2020-01-11 MED ORDER — TRANEXAMIC ACID (OHS) BOLUS VIA INFUSION
15.0000 mg/kg | INTRAVENOUS | Status: AC
  Administered 2020-01-12: 09:00:00 1603.5 mg via INTRAVENOUS
  Filled 2020-01-11: qty 1604

## 2020-01-11 MED ORDER — OXYCODONE-ACETAMINOPHEN 5-325 MG PO TABS
1.0000 | ORAL_TABLET | ORAL | Status: DC | PRN
  Administered 2020-01-12: 1 via ORAL
  Filled 2020-01-11: qty 2

## 2020-01-11 MED ORDER — HYDRALAZINE HCL 50 MG PO TABS
75.0000 mg | ORAL_TABLET | Freq: Three times a day (TID) | ORAL | Status: DC
  Administered 2020-01-11: 22:00:00 75 mg via ORAL
  Filled 2020-01-11: qty 1

## 2020-01-11 MED ORDER — PHENYLEPHRINE HCL-NACL 20-0.9 MG/250ML-% IV SOLN
30.0000 ug/min | INTRAVENOUS | Status: DC
  Filled 2020-01-11: qty 250

## 2020-01-11 MED ORDER — BISACODYL 10 MG RE SUPP
10.0000 mg | Freq: Once | RECTAL | Status: AC
  Administered 2020-01-11: 11:00:00 10 mg via RECTAL
  Filled 2020-01-11: qty 1

## 2020-01-11 MED ORDER — MILRINONE LACTATE IN DEXTROSE 20-5 MG/100ML-% IV SOLN
0.3000 ug/kg/min | INTRAVENOUS | Status: DC
  Filled 2020-01-11 (×2): qty 100

## 2020-01-11 MED ORDER — METOCLOPRAMIDE HCL 5 MG/ML IJ SOLN
10.0000 mg | Freq: Three times a day (TID) | INTRAMUSCULAR | Status: DC
  Administered 2020-01-11 – 2020-01-12 (×5): 10 mg via INTRAVENOUS
  Filled 2020-01-11 (×4): qty 2

## 2020-01-11 MED ORDER — POTASSIUM CHLORIDE 2 MEQ/ML IV SOLN
80.0000 meq | INTRAVENOUS | Status: DC
  Filled 2020-01-11: qty 40

## 2020-01-11 MED ORDER — BISACODYL 5 MG PO TBEC
5.0000 mg | DELAYED_RELEASE_TABLET | Freq: Once | ORAL | Status: AC
  Administered 2020-01-11: 20:00:00 5 mg via ORAL
  Filled 2020-01-11: qty 1

## 2020-01-11 MED ORDER — TRANEXAMIC ACID 1000 MG/10ML IV SOLN
1.5000 mg/kg/h | INTRAVENOUS | Status: DC
  Filled 2020-01-11: qty 25

## 2020-01-11 MED ORDER — INSULIN REGULAR(HUMAN) IN NACL 100-0.9 UT/100ML-% IV SOLN
INTRAVENOUS | Status: DC
  Filled 2020-01-11: qty 100

## 2020-01-11 MED ORDER — METOPROLOL TARTRATE 12.5 MG HALF TABLET
12.5000 mg | ORAL_TABLET | Freq: Once | ORAL | Status: AC
  Administered 2020-01-12: 06:00:00 12.5 mg via ORAL
  Filled 2020-01-11: qty 1

## 2020-01-11 MED ORDER — SODIUM CHLORIDE 0.9 % IV SOLN
INTRAVENOUS | Status: DC
  Filled 2020-01-11: qty 30

## 2020-01-11 MED ORDER — TEMAZEPAM 15 MG PO CAPS: 15.0000 mg | ORAL_CAPSULE | Freq: Once | ORAL | Status: DC | PRN

## 2020-01-11 MED ORDER — MILRINONE LACTATE IN DEXTROSE 20-5 MG/100ML-% IV SOLN: 0.1250 ug/kg/min | INTRAVENOUS | Status: DC

## 2020-01-11 MED ORDER — POTASSIUM CHLORIDE CRYS ER 20 MEQ PO TBCR
20.0000 meq | EXTENDED_RELEASE_TABLET | Freq: Once | ORAL | Status: AC
  Administered 2020-01-11: 15:00:00 20 meq via ORAL
  Filled 2020-01-11: qty 1

## 2020-01-11 MED ORDER — DOCUSATE SODIUM 100 MG PO CAPS
100.0000 mg | ORAL_CAPSULE | Freq: Two times a day (BID) | ORAL | Status: DC
  Administered 2020-01-11: 22:00:00 100 mg via ORAL
  Filled 2020-01-11 (×2): qty 1

## 2020-01-11 NOTE — Anesthesia Preprocedure Evaluation (Addendum)
Anesthesia Evaluation  Patient identified by MRN, date of birth, ID band Patient awake    Reviewed: Allergy & Precautions, NPO status , Patient's Chart, lab work & pertinent test results  Airway Mallampati: III  TM Distance: >3 FB Neck ROM: Full    Dental  (+) Missing   Pulmonary sleep apnea ,    Pulmonary exam normal breath sounds clear to auscultation       Cardiovascular hypertension, Pt. on medications + CAD and +CHF  Normal cardiovascular exam+ dysrhythmias Atrial Fibrillation + pacemaker + Cardiac Defibrillator  Rhythm:Regular Rate:Normal     Neuro/Psych PSYCHIATRIC DISORDERS Anxiety Depression Bipolar Disorder negative neurological ROS     GI/Hepatic Neg liver ROS, PUD, GERD  Medicated and Controlled,  Endo/Other  diabetes, Insulin Dependent  Renal/GU negative Renal ROS     Musculoskeletal  (+) Arthritis , Gout   Abdominal   Peds  Hematology  (+) anemia , HLD   Anesthesia Other Findings CAD  CHF  Reproductive/Obstetrics                           Anesthesia Physical Anesthesia Plan  ASA: IV  Anesthesia Plan: General   Post-op Pain Management:    Induction: Intravenous  PONV Risk Score and Plan: 2 and Treatment may vary due to age or medical condition  Airway Management Planned: Oral ETT  Additional Equipment: TEE  Intra-op Plan:   Post-operative Plan: Post-operative intubation/ventilation  Informed Consent: I have reviewed the patients History and Physical, chart, labs and discussed the procedure including the risks, benefits and alternatives for the proposed anesthesia with the patient or authorized representative who has indicated his/her understanding and acceptance.     Dental advisory given  Plan Discussed with: CRNA  Anesthesia Plan Comments: (Right IJ MAC central line in place PAC in place Left radial arterial line in place Impella in place)        Anesthesia Quick Evaluation

## 2020-01-11 NOTE — Progress Notes (Signed)
[place] for heparin Indication: Impella 5.5  Allergies  Allergen Reactions  . Orange Fruit Anaphylaxis, Hives and Other (See Comments)    Per Pt- Blisters around lips and Hives all over, also  . Penicillins Anaphylaxis    Did it involve swelling of the face/tongue/throat, SOB, or low BP? Yes Did it involve sudden or severe rash/hives, skin peeling, or any reaction on the inside of your mouth or nose? Yes Did you need to seek medical attention at a hospital or doctor's office? No When did it last happen?childhood If all above answers are "NO", may proceed with cephalosporin use.  [name] [Empagliflozin] Itching  . Basaglar Kwikpen [Insulin Glargine] Nausea And Vomiting    Patient Measurements: Height: 6\' 3"  (190.5 cm) Weight: 106.9 kg (235 lb 10.8 oz) IBW/kg (Calculated) : 84.5 Heparin dosing wt: 101 kg  Vital Signs: Temp: 98.42 F (36.9 C) (12/14 1300) Pulse Rate: 99 (12/14 1300)  Labs: Recent Labs    01/05/2020 0428 01/11/2020 0616 01/01/2020 1149 01/14/2020 2021 01/10/20 0421 01/10/20 1332 01/11/20 0224 01/11/20 0843  HGB 14.2   < > 15.6  --  13.2  --  13.2  --   HCT 41.5   < > 46.0  --  39.2  --  40.8  --   PLT 266  --   --   --  237  --  254  --   HEPARINUNFRC  --    < >  --    < > <0.10* <0.10* 0.10*  --   CREATININE 1.55*   < > 1.40*  --  1.41*  --   --  1.36*   < > = values in this interval not displayed.    Estimated Creatinine Clearance: 85 mL/min (A) (by C-G formula based on SCr of 1.36 mg/dL (H)).   Assessment: 49 yo male s/p impella 5.5 placement. Heparin via purge pump at 30ml/hr = 500 uts/hr + 850 uts/hr.  Heparin level 0.1 this am. Less than goal but purge pressure 500 ok.  No changes with plans for OR in am \ RN reports that pt has some bleeding at impella site and when pt blows nose, there is some blood on tissue. - will give saline spray   Goal of Therapy:  Heparin level 0.2-0.5 units/ml Monitor  platelets by anticoagulation protocol: Yes   Plan:  Continue  systemic heparin 850 units/hr  F/u in am post op     Bonnita Nasuti Pharm.D. CPP, BCPS Clinical Pharmacist [phone] 01/11/2020 2:47 PM

## 2020-01-11 NOTE — Progress Notes (Signed)
[place] for heparin Indication: Impella 5.5  Allergies  Allergen Reactions  . Orange Fruit Anaphylaxis, Hives and Other (See Comments)    Per Pt- Blisters around lips and Hives all over, also  . Penicillins Anaphylaxis    Did it involve swelling of the face/tongue/throat, SOB, or low BP? Yes Did it involve sudden or severe rash/hives, skin peeling, or any reaction on the inside of your mouth or nose? Yes Did you need to seek medical attention at a hospital or doctor's office? No When did it last happen?childhood If all above answers are "NO", may proceed with cephalosporin use.  [name] [Empagliflozin] Itching  . [name] [Insulin Glargine] Nausea And Vomiting    Patient Measurements: Height: 6\' 3"  (190.5 cm) Weight: 107 kg (235 lb 14.3 oz) IBW/kg (Calculated) : 84.5 Heparin dosing wt: 101 kg  Vital Signs: Temp: 97.7 F (36.5 C) (12/14 0215) BP: 144/90 (12/13 1607) Pulse Rate: 98 (12/14 0215)  Labs: Recent Labs    01/28/2020 0428 01/05/2020 0616 01/23/2020 1127 01/14/2020 1130 01/21/2020 1149 01/13/2020 2021 01/10/20 0421 01/10/20 1332 01/11/20 0224  HGB 14.2  --  16.0   < > 15.6  --  13.2  --  13.2  HCT 41.5  --  47.0   < > 46.0  --  39.2  --  40.8  PLT 266  --   --   --   --   --  237  --  254  HEPARINUNFRC  --    < >  --   --   --    < > <0.10* <0.10* 0.10*  CREATININE 1.55*  --  1.40*  --  1.40*  --  1.41*  --   --    < > = values in this interval not displayed.    Estimated Creatinine Clearance: 82 mL/min (A) (by C-G formula based on SCr of 1.41 mg/dL (H)).   Assessment: 49 yo male s/p impella 5.5 placement. Heparin via purge pump at 4.2 ml/hr (210 units/hr), Systemic heparin at 800 units/hr = total heparin 1010 uts/hr   Heparin level 0.1 (low). RN reports that pt has some bleeding at impella site and when pt blows nose, there is some blood on tissue.  Required heparin drip 1600 uts/hr to be therapeutic prior  to impella placement  Goal of Therapy:  Heparin level 0.2-0.5 units/ml Monitor platelets by anticoagulation protocol: Yes   Plan:  Increase systemic heparin slightly to 850 units/hr  F/u 6 hr heparin level F/u further bleeding  Sherlon Handing, PharmD, BCPS Please see amion for complete clinical pharmacist phone list 01/11/2020 3:23 AM

## 2020-01-11 NOTE — Evaluation (Signed)
Physical Therapy Evaluation and D/C Patient Details Name: Corey Palmer MRN: [id number] DOB: November 16, 1970 Today's Date: 01/11/2020   History of Present Illness  49 year old man underwent Impella 5.5 insertion today for persistent symptoms of forward failure with low cardiac indices.He presented last week for ICD shocks and was found to be in rapid atrial fibrillation for which he was cardioverted.  He was also found to be in decompensated heart failure with an ejection fraction of 15% and multivessel coronary disease on left heart catheterization.He was started on tropes and diuresed which improved his congestive symptoms but his index remained low so an Impella was placed to optimize his hemodynamics prior to a high risk redo-bypass which will be performed 12/15.  Clinical Impression  Pt admitted with above diagnosis. Pt did well getting to chair with min guard assist and +2 for lines.  Goals not set as pt to have CABG tomorrow and will need re-evaluation after surgery. Will need new order after surgery. Sign off.    Follow Up Recommendations Other (comment) (TBA after CABG/RE-eval)    Equipment Recommendations  Other (comment) (TBA)    Recommendations for Other Services       Precautions / Restrictions Precautions Precautions: Fall Precaution Comments: Impella, Swan, catheter Restrictions Weight Bearing Restrictions: No      Mobility  Bed Mobility Overal bed mobility: Needs Assistance Bed Mobility: Supine to Sit     Supine to sit: Min assist     General bed mobility comments: Needed a little assist for trunk due to decr weight bearing on right UE due to Impella    Transfers Overall transfer level: Needs assistance Equipment used: 2 person hand held assist Transfers: Sit to/from [facility] Sit to Stand: Min guard Stand pivot transfers: Min guard       General transfer comment: able to take pivotal steps to chair without physical  assist.  Ambulation/Gait             General Gait Details: Bed to chair only per nurse today  Stairs            Wheelchair Mobility    Modified Rankin (Stroke Patients Only)       Balance Overall balance assessment: Needs assistance         Standing balance support: No upper extremity supported;During functional activity Standing balance-Leahy Scale: Fair                               Pertinent Vitals/Pain Pain Assessment: No/denies pain    Home Living Family/patient expects to be discharged to:: Private residence Living Arrangements: Spouse/significant other Available Help at Discharge: Family;Available 24 hours/day Type of Home: House Home Access: Level entry     Home Layout: Two level;Able to live on main level with bedroom/bathroom Home Equipment: None      Prior Function Level of Independence: Independent         Comments: works as Radiographer, therapeutic   Dominant Hand: Right    Extremity/Trunk Assessment   Upper Extremity Assessment Upper Extremity Assessment: Defer to OT evaluation    Lower Extremity Assessment Lower Extremity Assessment: Generalized weakness    Cervical / Trunk Assessment Cervical / Trunk Assessment: Normal  Communication   Communication: No difficulties  Cognition Arousal/Alertness: Awake/alert Behavior During Therapy: WFL for tasks assessed/performed Overall Cognitive Status: Within Functional Limits for tasks assessed  General Comments General comments (skin integrity, edema, etc.): 104 bpm, 96% RA, 120/69 with no change with mobiltiy    Exercises General Exercises - Lower Extremity Ankle Circles/Pumps: AROM;Both;10 reps;Supine Long Arc Quad: AROM;Both;10 reps;Seated Hip Flexion/Marching: AROM;Both;10 reps;Seated   Assessment/Plan    PT Assessment Patent does not need any further PT services  PT Problem List  Decreased mobility;Decreased knowledge of use of DME;Decreased safety awareness;Decreased knowledge of precautions;Cardiopulmonary status limiting activity;Decreased activity tolerance       PT Treatment Interventions Therapeutic activities;Therapeutic exercise    PT Goals (Current goals can be found in the Care Plan section)  Acute Rehab PT Goals Patient Stated Goal: to have surgery PT Goal Formulation: All assessment and education complete, DC therapy    Frequency     Barriers to discharge        Co-evaluation               AM-PAC PT "6 Clicks" Mobility  Outcome Measure Help needed turning from your back to your side while in a flat bed without using bedrails?: A Little Help needed moving from lying on your back to sitting on the side of a flat bed without using bedrails?: A Little Help needed moving to and from a bed to a chair (including a wheelchair)?: A Little Help needed standing up from a chair using your arms (e.g., wheelchair or bedside chair)?: A Little Help needed to walk in hospital room?: A Little Help needed climbing 3-5 steps with a railing? : Total 6 Click Score: 16    End of Session   Activity Tolerance: Patient limited by fatigue Patient left: in chair;with call bell/phone within reach;with nursing/sitter in room Nurse Communication: Mobility status PT Visit Diagnosis: Muscle weakness (generalized) (M62.81)    Time: 1110-1135 PT Time Calculation (min) (ACUTE ONLY): 25 min   Charges:   PT Evaluation $PT Eval Moderate Complexity: 1 Mod PT Treatments $Therapeutic Activity: 8-22 mins        Corey Palmer,PT Acute Rehabilitation Services Pager:  [phone]  Office:  [phone]    Corey Palmer 01/11/2020, 12:22 PM

## 2020-01-11 NOTE — Progress Notes (Addendum)
Patient ID: Corey Palmer, male   DOB: 11-10-1970, 49 y.o.   MRN: [id number]     Advanced Heart Failure Rounding Note  PCP-Cardiologist: No primary care provider on file.   Subjective:    - 12/7 Torsades -ICD shock x1.  - 12/11 Impella 5.5 placed.  - 12/12 Impella repositioned in OR   He remains on milrinone 0.25 with NTG gtt 75 for BP control and heparin gtt.  SBP better today in 130s. MAPs upper 80s-90. BMP pending.    He remains in NSR on amiodarone gtt.   Main complaint today is nausea and constipation. No BM in 3 days. No relief w/ zofran.   WBC elevated, 7.4>>11>>14.1. AF. CXR shows no PNA. Has foley   Swan numbers: CVP 4 PA 43/19 (24) CI 4.37 Co-ox 82%  Impella 5.5 P8 Flow 4.4 Good waveforms No alarms this morning  Echo (12/10): EF <20%, moderate LV dilation, normal RV size with mildly decreased systolic function.   RHC/LHC: Coronary Findings   Diagnostic Dominance: Right  Left Main  30% distal left main stenosis.  Left Anterior Descending  40% ostial LAD stenosis. 90% stenosis proximal LAD just distal to D1 take-off. Large D1 with 99% proximal stenosis, 60% mid vessel stenosis.  Left Circumflex  Long up to 70% proximal LCx stenosis.  Right Coronary Artery  95% mid RCA stenosis. 75% distal RCA stenosis. 60% stenosis proximal PLV. 99% stenosis mid PDA.   Intervention   No interventions have been documented.  Right Heart  Right Heart Pressures RHC Procedural Findings: Hemodynamics (mmHg) RA mean 16 RV 53/12 PA 65/29, mean 46 PCWP mean 31 LV 119/28 AO 118/87  Oxygen saturations: PA 45% AO 93%  Cardiac Output (Fick) 3.39  Cardiac Index (Fick) 1.4 PVR 4.4 WU PAPi 2.25     Objective:   Weight Range: 106.9 kg Body mass index is 29.46 kg/m.   Vital Signs:   Temp:  [97.34 F (36.3 C)-98.6 F (37 C)] 97.52 F (36.4 C) (12/14 0700) Pulse Rate:  [46-104] 103 (12/14 0700) Resp:  [14-27] 24 (12/14 0700) BP: (144-153)/(88-90) 144/90  (12/13 1607) SpO2:  [95 %-100 %] 96 % (12/14 0700) Arterial Line BP: (115-173)/(65-90) 123/67 (12/14 0700) Weight:  [106.9 kg] 106.9 kg (12/14 0630) Last BM Date: 01/26/2020  Weight change: Filed Weights   01/08/2020 0400 01/10/20 0500 01/11/20 0630  Weight: 104.6 kg 107 kg 106.9 kg    Intake/Output:   Intake/Output Summary (Last 24 hours) at 01/11/2020 0734 Last data filed at 01/11/2020 0600 Gross per 24 hour  Intake 1482.95 ml  Output 1840 ml  Net -357.05 ml      Physical Exam    CVP 4  General:  Looks uncomfortable (nauseated). No respiratory difficulty HEENT: normal Neck: supple. no JVD. Carotids 2+ bilat; no bruits. No lymphadenopathy or thyromegaly appreciated. + rt subclavian impella (site stable) Cor: PMI nondisplaced. Regular rhythm, mildly tachy rate. No rubs, gallops or murmurs. Lungs: clear Abdomen: soft, nontender, nondistended. No hepatosplenomegaly. No bruits or masses. Good bowel sounds. Extremities: no cyanosis, clubbing, rash, edema + bilateral unna boots  Neuro: alert & oriented x 3, cranial nerves grossly intact. moves all 4 extremities w/o difficulty. Affect pleasant.    Telemetry    NSR 90s-low 100s, personally reviewed   Labs    CBC Recent Labs    01/10/20 0421 01/11/20 0224  WBC 11.0* 14.1*  HGB 13.2 13.2  HCT 39.2 40.8  MCV 80.0 81.3  PLT 237 993   Basic Metabolic  Panel Recent Labs    01/17/2020 0428 01/08/2020 0831 12/31/2019 1149 01/10/20 0421 01/11/20 0224  NA 132*   < > 133* 132*  --   K 3.6   < > 5.1 3.8  --   CL 98   < > 100 100  --   CO2 24  --   --  21*  --   GLUCOSE 150*   < > 236* 113*  --   BUN 42*   < > 41* 30*  --   CREATININE 1.55*   < > 1.40* 1.41*  --   CALCIUM 8.8*  --   --  8.5*  --   MG  --    < >  --  1.7 1.9   < > = values in this interval not displayed.   Liver Function Tests No results for input(s): AST, ALT, ALKPHOS, BILITOT, PROT, ALBUMIN in the last 72 hours. No results for input(s): LIPASE, AMYLASE  in the last 72 hours. Cardiac Enzymes No results for input(s): CKTOTAL, CKMB, CKMBINDEX, TROPONINI in the last 72 hours.  BNP: BNP (last 3 results) Recent Labs    01/19/2020 1619  BNP 577.5*    ProBNP (last 3 results) No results for input(s): PROBNP in the last 8760 hours.   D-Dimer No results for input(s): DDIMER in the last 72 hours. Hemoglobin A1C No results for input(s): HGBA1C in the last 72 hours. Fasting Lipid Panel No results for input(s): CHOL, HDL, LDLCALC, TRIG, CHOLHDL, LDLDIRECT in the last 72 hours. Thyroid Function Tests No results for input(s): TSH, T4TOTAL, T3FREE, THYROIDAB in the last 72 hours.  Invalid input(s): FREET3  Other results:   Imaging    DG Chest 1 View  Result Date: 01/11/2020 CLINICAL DATA:  Impella.  History of CHF. EXAM: CHEST  1 VIEW COMPARISON:  01/10/2020. FINDINGS: Swan-Ganz catheter in stable position. Impella device and cardiac pacer stable position. Stable cardiomegaly and pulmonary venous congestion. Mild bilateral interstitial prominence. Mild CHF cannot be excluded. Low lung volumes with bibasilar atelectasis. Tiny left pleural effusion cannot be excluded. Surgical staples right chest. IMPRESSION: 1. Swan-Ganz catheter in stable position. Impella device and cardiac pacer in stable position. 2. Cardiomegaly with pulmonary venous congestion and mild interstitial prominence suggesting mild CHF. Electronically Signed   By: Marcello Moores  Register   On: 01/11/2020 06:37   DG Chest 1 View  Result Date: 01/10/2020 CLINICAL DATA:  Chest pain.  Status post extubation EXAM: CHEST  1 VIEW COMPARISON:  January 09, 2020 FINDINGS: Endotracheal tube has been removed. Impella device unchanged in position. Pacemaker lead attached to right ventricle. Swan-Ganz catheter tip is in the right main pulmonary artery. No appreciable pneumothorax. There is no edema or airspace opacity. There is cardiomegaly with pulmonary vascularity normal. No adenopathy. No bone  lesions. IMPRESSION: Tube and catheter positions as described without pneumothorax. Stable cardiomegaly. No edema or airspace opacity. Electronically Signed   By: Lowella Grip III M.D.   On: 01/10/2020 08:21   ECHOCARDIOGRAM LIMITED  Result Date: 01/10/2020    ECHOCARDIOGRAM LIMITED REPORT   [name] Date of Exam: 01/10/2020 Medical Rec #:  [id number]     Height:       75.0 in Accession #:    [id number]    Weight:       235.9 lb Date of Birth:  1970/10/17     BSA:          2.354 m Patient Age:  49 years      BP:           0/0 mmHg Patient Gender: M             HR:           82 bpm. Exam Location:  Inpatient Procedure: Limited Echo Indications:     impella placement  History:         Patient has prior history of Echocardiogram examinations, most                  recent 01/19/2020. Cardiomyopathy, CAD, Defibrillator,                  Arrythmias:Atrial Fibrillation; Risk Factors:Diabetes,                  Dyslipidemia and Hypertension.  Sonographer:     Johny Chess Referring Phys:  [place] Diagnosing Phys: Loralie Champagne MD IMPRESSIONS  1. Left ventricular ejection fraction, by estimation, is <20%. The left ventricle has severely decreased function. The left ventricular internal cavity size was moderately dilated. Impella catheter in place at about 5.3 cm from aortic valve.  2. Right ventricular systolic function is mildly reduced. The right ventricular size is normal.  3. Limited echo. FINDINGS  Left Ventricle: Left ventricular ejection fraction, by estimation, is <20%. The left ventricle has severely decreased function. The left ventricular internal cavity size was moderately dilated. Right Ventricle: The right ventricular size is normal. Right ventricular systolic function is mildly reduced. Loralie Champagne MD Electronically signed by Loralie Champagne MD Signature Date/Time: 01/10/2020/2:09:41 PM    Final (Updated)      Medications:     Scheduled Medications: .  sodium chloride   Intravenous Once  . aspirin EC  81 mg Oral Daily  . atorvastatin  80 mg Oral Daily  . busPIRone  10 mg Oral TID  . Chlorhexidine Gluconate Cloth  6 each Topical Daily  . colchicine  0.6 mg Oral Daily  . dapagliflozin propanediol  10 mg Oral Daily  . dicyclomine  10 mg Oral TID AC  . digoxin  0.125 mg Oral Daily  . divalproex  500 mg Oral QHS  . FLUoxetine  40 mg Oral BID  . hydrALAZINE  50 mg Oral Q8H  . insulin aspart  0-15 Units Subcutaneous Q4H  . insulin glargine  10 Units Subcutaneous Daily  . lidocaine  1 patch Transdermal Q24H  . losartan  25 mg Oral Daily  . magnesium oxide  200 mg Oral Daily  . mouth rinse  15 mL Mouth Rinse BID  . sodium chloride flush  10-40 mL Intracatheter Q12H  . sodium chloride flush  10-40 mL Intracatheter Q12H  . sodium chloride flush  3 mL Intravenous Q12H  . spironolactone  25 mg Oral Daily    Infusions: . sodium chloride Stopped (01/06/20 1204)  . albumin human Stopped (01/22/2020 1434)  . amiodarone 30 mg/hr (01/11/20 0600)  . heparin 850 Units/hr (01/11/20 0600)  . impella catheter heparin 50 unit/mL in dextrose 5%    . milrinone 0.25 mcg/kg/min (01/11/20 0600)  . nitroGLYCERIN 75 mcg/min (01/11/20 0600)    PRN Medications: sodium chloride, sodium chloride, acetaminophen, albumin human, ALPRAZolam, fentaNYL (SUBLIMAZE) injection, hydrALAZINE, ondansetron (ZOFRAN) IV, oxyCODONE-acetaminophen, sodium chloride flush, sodium chloride flush, traMADol     Assessment/Plan   1. Atrial fibrillation: H/o PAF.  He was on dofetilide in the remote past but this was stopped due to noncompliance. He had  an upper GI bleed from antral ulcers in 3/12. He was seen by GI and was deemed safe to restart anticoagulation as long as he remains on a PPI.  No apparent recurrence of AF until just prior to this admission, was cardioverted back to NSR in ER but went back into atrial fibrillation.  Now he has returned to NSR.  - Continue amiodarone  gtt 30 mg/hr.  - Continue heparin gtt.  - Maze with CABG.  2. CAD: Nonobstructive mild CAD on remote cath in 2002. He has had chest pain but is pleuritic.  HS-TnI 64 => 92, suspect demand ischemia with volume overload and afib/RVR.   Cath this admission with severe 3 vessel disease, ideally treated by CABG given extent of disease.  If no CABG, could approach with multivessel PCI but would be complex and need Impella support.  - Continue atorvastatin, ASA.  - Dr. [name] consulting, current plan for high-risk CABG Wednesday, supported by Impella 5.5.  - Unable to do cardiac MRI for viability given old ICD leads.  However, no thinning of the myocardium noted.  3. Acute on chronic systolic CHF: Initially nonischemic cardiomyopathy. Possible familial cardiomyopathy as both parents had cardiomyopathy and died at around 68.However, [employer] gene testing did not show any common mutation for cardiomyopathy.  However, this admission noted to have severe 3 vessel disease so suspect component of ischemic cardiomyopathy. [employer] ICD. Echo in 8/20 with EF 15% and mildly decreased RV function. Echo this admission with EF < 20%, moderate LV dilation, RV mildly reduced, severe LAE, no significant MR.Low output HF with markedly low EF.  He has a long history of cardiomyopathy (20 yrs), tends to minimize symptoms.  Cardiorenal syndrome with creatinine up to 2.2, now stabilized on milrinone and Impella 5.5. SCr 1.4 yesterday. F/u BMP pending.  This morning, Co-ox 82% with CVP 4. CI 4.37  - Continue Impella P8.  Heparin gtt running, LDH 263.   - Continue Nitro gtt for BP control.  - Continue hydralazine 50 mg tid - Continue  spironolactone 25 daily.  - On milrinone 0.25. Cardiac output has stabilized on Impella. Will wean milrinone to 0.125 - Hold diuretics today w/ low CVP  - Continue dig 0.125 mg daily. Level ok recently.     - Continue dapagliflozin.  - Coreg held with low output.   - Continue Unna boots.  - As  above, considering high risk CABG with severe 3VD => possibly Wednesday.  Alternatively, may need to consider directly to LVAD if thought too high risk.  RV function is reasonable, at most mildly hypokinetic on echo 12/10.  4. OSA: OSA on sleep study, has not been using CPAP.  - Start CPAP here.  5. AKI on CKD stage ?3: Last creatinine prior to admission was 1.38.  Creatinine peaked 2.2 this admission.  Suspect cardiorenal syndrome. Creatinine down to 1.4 yesterday with Impella.  F/u BMP pending  - Holding Lasix with CVP 4.   6. Diabetes: hgbA1c 10.6.  - Insulin sliding scale.  - Consulted diabetes coordinator.  - Followed by Dr Loanne Drilling.  7. Hyponatremia: Hypervolemic hyponatremia: Fluid restrict.  8. Torsades/ICD shock: 12/7/212 in hospital event, in setting of severe hypokalemia and hypomagnesemia.  - Follow K and Mg closely.  - Continue IV amio  9. Gout: h/o gout. Complains of rt knee pain c/w previous flares - treated w/ prednisone burst (completed).  - Continue colchicine 0.6 (takes with relief at home).  10. Right shoulder pain: Impella site, no  significant hematoma noted.  - Pain control.  11. Leukocytosis - WBC elevated, 7.4>>11>>14.1. AF - CXR today negative for infiltrates. Has foley  - Check UA/UC and BCs  - follow closely  12. GI - complains of nausea w/ no relief w/ zofran - constipation, no BM in 3 days - add [place], PA-C  01/11/2020 7:34 AM  Patient seen with PA, agree with the above note.   CI 4.37, co-ox 82%, CVP 8-9 on my read.  Complains of nausea.   General: NAD Neck: JVP 8 cm, no thyromegaly or thyroid nodule.  Lungs: Clear to auscultation bilaterally with normal respiratory effort. CV: Nondisplaced PMI.  Heart regular S1/S2, no S3/S4, no murmur.  Trace ankle edema.  Abdomen: Soft, nontender, no hepatosplenomegaly, no distention.  Skin: Intact without lesions or rashes.  Neurologic: Alert and oriented x 3.  Psych: Normal  affect. Extremities: No clubbing or cyanosis.  HEENT: Normal.   Good cardiac output on Impella with milrinone 0.25, MAP still reading relatively high.  Pending BMET this morning but creatinine has been trending down.  LDH stable 263.  Impella position adequate yesterday. No suction alarms.  - Decrease milrinone to 0.125.  - Will increase hydralazine to 75 mg tid and try to titrate down NTG gtt.  - Continue spironolactone.  - Continue losartan 25 mg daily.  - CVP 8-9, will hold off on Lasix for now.  - Plan for high risk CABG-Maze tomorrow with Impella support.   He remains in NSR on amiodarone gtt.   Heparin gtt for Impella and atrial fibrillation.   Will give Reglan and dulcolax suppository for constipation and nausea.    Out of bed, to chair and ambulate.   CRITICAL CARE Performed by: Loralie Champagne  Total critical care time: 35 minutes  Critical care time was exclusive of separately billable procedures and treating other patients.  Critical care was necessary to treat or prevent imminent or life-threatening deterioration.  Critical care was time spent personally by me on the following activities: development of treatment plan with patient and/or surrogate as well as nursing, discussions with consultants, evaluation of patient's response to treatment, examination of patient, obtaining history from patient or surrogate, ordering and performing treatments and interventions, ordering and review of laboratory studies, ordering and review of radiographic studies, pulse oximetry and re-evaluation of patient's condition.   Loralie Champagne 01/11/2020 8:26 AM

## 2020-01-12 ENCOUNTER — Other Ambulatory Visit: Payer: Self-pay

## 2020-01-12 ENCOUNTER — Other Ambulatory Visit (HOSPITAL_COMMUNITY): Payer: Self-pay | Admitting: Psychiatry

## 2020-01-12 ENCOUNTER — Inpatient Hospital Stay (HOSPITAL_COMMUNITY): Payer: BC Managed Care – PPO

## 2020-01-12 ENCOUNTER — Inpatient Hospital Stay (HOSPITAL_COMMUNITY): Payer: BC Managed Care – PPO | Admitting: Anesthesiology

## 2020-01-12 ENCOUNTER — Encounter (HOSPITAL_COMMUNITY): Admission: EM | Disposition: E | Payer: Self-pay | Source: Home / Self Care | Attending: Cardiothoracic Surgery

## 2020-01-12 DIAGNOSIS — I340 Nonrheumatic mitral (valve) insufficiency: Secondary | ICD-10-CM | POA: Diagnosis not present

## 2020-01-12 DIAGNOSIS — I2510 Atherosclerotic heart disease of native coronary artery without angina pectoris: Secondary | ICD-10-CM | POA: Diagnosis not present

## 2020-01-12 DIAGNOSIS — Z951 Presence of aortocoronary bypass graft: Secondary | ICD-10-CM

## 2020-01-12 HISTORY — PX: TEE WITHOUT CARDIOVERSION: SHX5443

## 2020-01-12 HISTORY — PX: CORONARY ARTERY BYPASS GRAFT: SHX141

## 2020-01-12 LAB — POCT I-STAT 7, (LYTES, BLD GAS, ICA,H+H)
Acid-base deficit: 1 mmol/L (ref 0.0–2.0)
Acid-base deficit: 3 mmol/L — ABNORMAL HIGH (ref 0.0–2.0)
Acid-base deficit: 1 mmol/L (ref 0.0–2.0)
Acid-base deficit: 1 mmol/L (ref 0.0–2.0)
Acid-base deficit: 6 mmol/L — ABNORMAL HIGH (ref 0.0–2.0)
Acid-base deficit: 6 mmol/L — ABNORMAL HIGH (ref 0.0–2.0)
Acid-base deficit: 1 mmol/L (ref 0.0–2.0)
Acid-base deficit: 3 mmol/L — ABNORMAL HIGH (ref 0.0–2.0)
Acid-base deficit: 5 mmol/L — ABNORMAL HIGH (ref 0.0–2.0)
Acid-base deficit: 4 mmol/L — ABNORMAL HIGH (ref 0.0–2.0)
Acid-base deficit: 4 mmol/L — ABNORMAL HIGH (ref 0.0–2.0)
Bicarbonate: 26 mmol/L (ref 20.0–28.0)
Bicarbonate: 24.8 mmol/L (ref 20.0–28.0)
Bicarbonate: 24.3 mmol/L (ref 20.0–28.0)
Bicarbonate: 24.6 mmol/L (ref 20.0–28.0)
Bicarbonate: 23.2 mmol/L (ref 20.0–28.0)
Bicarbonate: 23.1 mmol/L (ref 20.0–28.0)
Bicarbonate: 26.1 mmol/L (ref 20.0–28.0)
Bicarbonate: 22 mmol/L (ref 20.0–28.0)
Bicarbonate: 20.7 mmol/L (ref 20.0–28.0)
Bicarbonate: 21 mmol/L (ref 20.0–28.0)
Bicarbonate: 20.9 mmol/L (ref 20.0–28.0)
Calcium, Ion: 1.22 mmol/L (ref 1.15–1.40)
Calcium, Ion: 1.11 mmol/L — ABNORMAL LOW (ref 1.15–1.40)
Calcium, Ion: 1.12 mmol/L — ABNORMAL LOW (ref 1.15–1.40)
Calcium, Ion: 1.12 mmol/L — ABNORMAL LOW (ref 1.15–1.40)
Calcium, Ion: 1.19 mmol/L (ref 1.15–1.40)
Calcium, Ion: 1.32 mmol/L (ref 1.15–1.40)
Calcium, Ion: 1.35 mmol/L (ref 1.15–1.40)
Calcium, Ion: 1.23 mmol/L (ref 1.15–1.40)
Calcium, Ion: 1.49 mmol/L — ABNORMAL HIGH (ref 1.15–1.40)
Calcium, Ion: 1.41 mmol/L — ABNORMAL HIGH (ref 1.15–1.40)
Calcium, Ion: 1.27 mmol/L (ref 1.15–1.40)
HCT: 39 % (ref 39.0–52.0)
HCT: 29 % — ABNORMAL LOW (ref 39.0–52.0)
HCT: 27 % — ABNORMAL LOW (ref 39.0–52.0)
HCT: 28 % — ABNORMAL LOW (ref 39.0–52.0)
HCT: 29 % — ABNORMAL LOW (ref 39.0–52.0)
HCT: 30 % — ABNORMAL LOW (ref 39.0–52.0)
HCT: 23 % — ABNORMAL LOW (ref 39.0–52.0)
HCT: 20 % — ABNORMAL LOW (ref 39.0–52.0)
HCT: 19 % — ABNORMAL LOW (ref 39.0–52.0)
HCT: 17 % — ABNORMAL LOW (ref 39.0–52.0)
HCT: 21 % — ABNORMAL LOW (ref 39.0–52.0)
Hemoglobin: 13.3 g/dL (ref 13.0–17.0)
Hemoglobin: 9.9 g/dL — ABNORMAL LOW (ref 13.0–17.0)
Hemoglobin: 9.2 g/dL — ABNORMAL LOW (ref 13.0–17.0)
Hemoglobin: 9.5 g/dL — ABNORMAL LOW (ref 13.0–17.0)
Hemoglobin: 9.9 g/dL — ABNORMAL LOW (ref 13.0–17.0)
Hemoglobin: 10.2 g/dL — ABNORMAL LOW (ref 13.0–17.0)
Hemoglobin: 7.8 g/dL — ABNORMAL LOW (ref 13.0–17.0)
Hemoglobin: 6.8 g/dL — CL (ref 13.0–17.0)
Hemoglobin: 6.5 g/dL — CL (ref 13.0–17.0)
Hemoglobin: 5.8 g/dL — CL (ref 13.0–17.0)
Hemoglobin: 7.1 g/dL — ABNORMAL LOW (ref 13.0–17.0)
O2 Saturation: 100 %
O2 Saturation: 100 %
O2 Saturation: 100 %
O2 Saturation: 100 %
O2 Saturation: 95 %
O2 Saturation: 97 %
O2 Saturation: 100 %
O2 Saturation: 100 %
O2 Saturation: 99 %
O2 Saturation: 100 %
O2 Saturation: 100 %
Patient temperature: 37.6
Patient temperature: 37.3
Patient temperature: 37.6
Patient temperature: 37.8
Patient temperature: 38
Potassium: 4.2 mmol/L (ref 3.5–5.1)
Potassium: 4.5 mmol/L (ref 3.5–5.1)
Potassium: 4.8 mmol/L (ref 3.5–5.1)
Potassium: 5.6 mmol/L — ABNORMAL HIGH (ref 3.5–5.1)
Potassium: 4.7 mmol/L (ref 3.5–5.1)
Potassium: 4.5 mmol/L (ref 3.5–5.1)
Potassium: 4.3 mmol/L (ref 3.5–5.1)
Potassium: 4 mmol/L (ref 3.5–5.1)
Potassium: 4.1 mmol/L (ref 3.5–5.1)
Potassium: 4.1 mmol/L (ref 3.5–5.1)
Potassium: 4.2 mmol/L (ref 3.5–5.1)
Sodium: 131 mmol/L — ABNORMAL LOW (ref 135–145)
Sodium: 132 mmol/L — ABNORMAL LOW (ref 135–145)
Sodium: 131 mmol/L — ABNORMAL LOW (ref 135–145)
Sodium: 131 mmol/L — ABNORMAL LOW (ref 135–145)
Sodium: 133 mmol/L — ABNORMAL LOW (ref 135–145)
Sodium: 134 mmol/L — ABNORMAL LOW (ref 135–145)
Sodium: 133 mmol/L — ABNORMAL LOW (ref 135–145)
Sodium: 134 mmol/L — ABNORMAL LOW (ref 135–145)
Sodium: 134 mmol/L — ABNORMAL LOW (ref 135–145)
Sodium: 133 mmol/L — ABNORMAL LOW (ref 135–145)
Sodium: 135 mmol/L (ref 135–145)
TCO2: 28 mmol/L (ref 22–32)
TCO2: 27 mmol/L (ref 22–32)
TCO2: 26 mmol/L (ref 22–32)
TCO2: 26 mmol/L (ref 22–32)
TCO2: 25 mmol/L (ref 22–32)
TCO2: 25 mmol/L (ref 22–32)
TCO2: 28 mmol/L (ref 22–32)
TCO2: 23 mmol/L (ref 22–32)
TCO2: 22 mmol/L (ref 22–32)
TCO2: 22 mmol/L (ref 22–32)
TCO2: 22 mmol/L (ref 22–32)
pCO2 arterial: 53.4 mmHg — ABNORMAL HIGH (ref 32.0–48.0)
pCO2 arterial: 59.7 mmHg — ABNORMAL HIGH (ref 32.0–48.0)
pCO2 arterial: 43 mmHg (ref 32.0–48.0)
pCO2 arterial: 45.7 mmHg (ref 32.0–48.0)
pCO2 arterial: 63.9 mmHg — ABNORMAL HIGH (ref 32.0–48.0)
pCO2 arterial: 66 mmHg (ref 32.0–48.0)
pCO2 arterial: 55 mmHg — ABNORMAL HIGH (ref 32.0–48.0)
pCO2 arterial: 38.4 mmHg (ref 32.0–48.0)
pCO2 arterial: 39.3 mmHg (ref 32.0–48.0)
pCO2 arterial: 39 mmHg (ref 32.0–48.0)
pCO2 arterial: 37.7 mmHg (ref 32.0–48.0)
pH, Arterial: 7.296 — ABNORMAL LOW (ref 7.350–7.450)
pH, Arterial: 7.227 — ABNORMAL LOW (ref 7.350–7.450)
pH, Arterial: 7.339 — ABNORMAL LOW (ref 7.350–7.450)
pH, Arterial: 7.361 (ref 7.350–7.450)
pH, Arterial: 7.168 — CL (ref 7.350–7.450)
pH, Arterial: 7.155 — CL (ref 7.350–7.450)
pH, Arterial: 7.287 — ABNORMAL LOW (ref 7.350–7.450)
pH, Arterial: 7.369 (ref 7.350–7.450)
pH, Arterial: 7.329 — ABNORMAL LOW (ref 7.350–7.450)
pH, Arterial: 7.342 — ABNORMAL LOW (ref 7.350–7.450)
pH, Arterial: 7.355 (ref 7.350–7.450)
pO2, Arterial: 419 mmHg — ABNORMAL HIGH (ref 83.0–108.0)
pO2, Arterial: 274 mmHg — ABNORMAL HIGH (ref 83.0–108.0)
pO2, Arterial: 345 mmHg — ABNORMAL HIGH (ref 83.0–108.0)
pO2, Arterial: 361 mmHg — ABNORMAL HIGH (ref 83.0–108.0)
pO2, Arterial: 97 mmHg (ref 83.0–108.0)
pO2, Arterial: 123 mmHg — ABNORMAL HIGH (ref 83.0–108.0)
pO2, Arterial: 202 mmHg — ABNORMAL HIGH (ref 83.0–108.0)
pO2, Arterial: 223 mmHg — ABNORMAL HIGH (ref 83.0–108.0)
pO2, Arterial: 140 mmHg — ABNORMAL HIGH (ref 83.0–108.0)
pO2, Arterial: 225 mmHg — ABNORMAL HIGH (ref 83.0–108.0)
pO2, Arterial: 188 mmHg — ABNORMAL HIGH (ref 83.0–108.0)

## 2020-01-12 LAB — POCT I-STAT, CHEM 8
BUN: 25 mg/dL — ABNORMAL HIGH (ref 6–20)
BUN: 25 mg/dL — ABNORMAL HIGH (ref 6–20)
BUN: 24 mg/dL — ABNORMAL HIGH (ref 6–20)
BUN: 25 mg/dL — ABNORMAL HIGH (ref 6–20)
BUN: 26 mg/dL — ABNORMAL HIGH (ref 6–20)
Calcium, Ion: 1.26 mmol/L (ref 1.15–1.40)
Calcium, Ion: 1.25 mmol/L (ref 1.15–1.40)
Calcium, Ion: 1.09 mmol/L — ABNORMAL LOW (ref 1.15–1.40)
Calcium, Ion: 1.14 mmol/L — ABNORMAL LOW (ref 1.15–1.40)
Calcium, Ion: 1.19 mmol/L (ref 1.15–1.40)
Chloride: 97 mmol/L — ABNORMAL LOW (ref 98–111)
Chloride: 97 mmol/L — ABNORMAL LOW (ref 98–111)
Chloride: 97 mmol/L — ABNORMAL LOW (ref 98–111)
Chloride: 98 mmol/L (ref 98–111)
Chloride: 98 mmol/L (ref 98–111)
Creatinine, Ser: 1.3 mg/dL — ABNORMAL HIGH (ref 0.61–1.24)
Creatinine, Ser: 1.3 mg/dL — ABNORMAL HIGH (ref 0.61–1.24)
Creatinine, Ser: 1.2 mg/dL (ref 0.61–1.24)
Creatinine, Ser: 1.2 mg/dL (ref 0.61–1.24)
Creatinine, Ser: 1.3 mg/dL — ABNORMAL HIGH (ref 0.61–1.24)
Glucose, Bld: 177 mg/dL — ABNORMAL HIGH (ref 70–99)
Glucose, Bld: 186 mg/dL — ABNORMAL HIGH (ref 70–99)
Glucose, Bld: 177 mg/dL — ABNORMAL HIGH (ref 70–99)
Glucose, Bld: 155 mg/dL — ABNORMAL HIGH (ref 70–99)
Glucose, Bld: 166 mg/dL — ABNORMAL HIGH (ref 70–99)
HCT: 39 % (ref 39.0–52.0)
HCT: 37 % — ABNORMAL LOW (ref 39.0–52.0)
HCT: 30 % — ABNORMAL LOW (ref 39.0–52.0)
HCT: 27 % — ABNORMAL LOW (ref 39.0–52.0)
HCT: 29 % — ABNORMAL LOW (ref 39.0–52.0)
Hemoglobin: 13.3 g/dL (ref 13.0–17.0)
Hemoglobin: 12.6 g/dL — ABNORMAL LOW (ref 13.0–17.0)
Hemoglobin: 10.2 g/dL — ABNORMAL LOW (ref 13.0–17.0)
Hemoglobin: 9.2 g/dL — ABNORMAL LOW (ref 13.0–17.0)
Hemoglobin: 9.9 g/dL — ABNORMAL LOW (ref 13.0–17.0)
Potassium: 4.2 mmol/L (ref 3.5–5.1)
Potassium: 4.6 mmol/L (ref 3.5–5.1)
Potassium: 4.9 mmol/L (ref 3.5–5.1)
Potassium: 5.6 mmol/L — ABNORMAL HIGH (ref 3.5–5.1)
Potassium: 4.7 mmol/L (ref 3.5–5.1)
Sodium: 132 mmol/L — ABNORMAL LOW (ref 135–145)
Sodium: 130 mmol/L — ABNORMAL LOW (ref 135–145)
Sodium: 134 mmol/L — ABNORMAL LOW (ref 135–145)
Sodium: 130 mmol/L — ABNORMAL LOW (ref 135–145)
Sodium: 132 mmol/L — ABNORMAL LOW (ref 135–145)
TCO2: 36 mmol/L — ABNORMAL HIGH (ref 22–32)
TCO2: 24 mmol/L (ref 22–32)
TCO2: 27 mmol/L (ref 22–32)
TCO2: 22 mmol/L (ref 22–32)
TCO2: 22 mmol/L (ref 22–32)

## 2020-01-12 LAB — BASIC METABOLIC PANEL
Anion gap: 9 (ref 5–15)
Anion gap: 11 (ref 5–15)
BUN: 25 mg/dL — ABNORMAL HIGH (ref 6–20)
BUN: 24 mg/dL — ABNORMAL HIGH (ref 6–20)
CO2: 23 mmol/L (ref 22–32)
CO2: 20 mmol/L — ABNORMAL LOW (ref 22–32)
Calcium: 8.7 mg/dL — ABNORMAL LOW (ref 8.9–10.3)
Calcium: 9.9 mg/dL (ref 8.9–10.3)
Chloride: 98 mmol/L (ref 98–111)
Chloride: 101 mmol/L (ref 98–111)
Creatinine, Ser: 1.44 mg/dL — ABNORMAL HIGH (ref 0.61–1.24)
Creatinine, Ser: 1.48 mg/dL — ABNORMAL HIGH (ref 0.61–1.24)
GFR, Estimated: 59 mL/min — ABNORMAL LOW (ref >60)
GFR, Estimated: 57 mL/min — ABNORMAL LOW (ref >60)
Glucose, Bld: 152 mg/dL — ABNORMAL HIGH (ref 70–99)
Glucose, Bld: 146 mg/dL — ABNORMAL HIGH (ref 70–99)
Potassium: 4 mmol/L (ref 3.5–5.1)
Potassium: 4.1 mmol/L (ref 3.5–5.1)
Sodium: 130 mmol/L — ABNORMAL LOW (ref 135–145)
Sodium: 132 mmol/L — ABNORMAL LOW (ref 135–145)

## 2020-01-12 LAB — PREPARE RBC (CROSSMATCH)

## 2020-01-12 LAB — BPAM CRYOPRECIPITATE
Blood Product Expiration Date: 202112151540
Blood Product Expiration Date: 202112151540
ISSUE DATE / TIME: 202112150958
ISSUE DATE / TIME: 202112150958
Unit Type and Rh: 6200
Unit Type and Rh: 6200

## 2020-01-12 LAB — POCT I-STAT EG7
Acid-base deficit: 7 mmol/L — ABNORMAL HIGH (ref 0.0–2.0)
Bicarbonate: 20.4 mmol/L (ref 20.0–28.0)
Calcium, Ion: 1.03 mmol/L — ABNORMAL LOW (ref 1.15–1.40)
HCT: 26 % — ABNORMAL LOW (ref 39.0–52.0)
Hemoglobin: 8.8 g/dL — ABNORMAL LOW (ref 13.0–17.0)
O2 Saturation: 70 %
Potassium: 3.8 mmol/L (ref 3.5–5.1)
Sodium: 137 mmol/L (ref 135–145)
TCO2: 22 mmol/L (ref 22–32)
pCO2, Ven: 50 mmHg (ref 44.0–60.0)
pH, Ven: 7.218 — ABNORMAL LOW (ref 7.250–7.430)
pO2, Ven: 44 mmHg (ref 32.0–45.0)

## 2020-01-12 LAB — CBC
HCT: 38.7 % — ABNORMAL LOW (ref 39.0–52.0)
HCT: 28 % — ABNORMAL LOW (ref 39.0–52.0)
HCT: 20.8 % — ABNORMAL LOW (ref 39.0–52.0)
Hemoglobin: 12.3 g/dL — ABNORMAL LOW (ref 13.0–17.0)
Hemoglobin: 9.1 g/dL — ABNORMAL LOW (ref 13.0–17.0)
Hemoglobin: 6.4 g/dL — CL (ref 13.0–17.0)
MCH: 26.1 pg (ref 26.0–34.0)
MCH: 27.7 pg (ref 26.0–34.0)
MCH: 26.2 pg (ref 26.0–34.0)
MCHC: 31.8 g/dL (ref 30.0–36.0)
MCHC: 32.5 g/dL (ref 30.0–36.0)
MCHC: 30.8 g/dL (ref 30.0–36.0)
MCV: 82.2 fL (ref 80.0–100.0)
MCV: 85.1 fL (ref 80.0–100.0)
MCV: 85.2 fL (ref 80.0–100.0)
Platelets: 257 10*3/uL (ref 150–400)
Platelets: 231 10*3/uL (ref 150–400)
Platelets: 200 10*3/uL (ref 150–400)
RBC: 4.71 MIL/uL (ref 4.22–5.81)
RBC: 3.29 MIL/uL — ABNORMAL LOW (ref 4.22–5.81)
RBC: 2.44 MIL/uL — ABNORMAL LOW (ref 4.22–5.81)
RDW: 15.9 % — ABNORMAL HIGH (ref 11.5–15.5)
RDW: 16 % — ABNORMAL HIGH (ref 11.5–15.5)
RDW: 15.9 % — ABNORMAL HIGH (ref 11.5–15.5)
WBC: 13.6 10*3/uL — ABNORMAL HIGH (ref 4.0–10.5)
WBC: 29.6 10*3/uL — ABNORMAL HIGH (ref 4.0–10.5)
WBC: 19.9 10*3/uL — ABNORMAL HIGH (ref 4.0–10.5)
nRBC: 0 % (ref 0.0–0.2)
nRBC: 0 % (ref 0.0–0.2)
nRBC: 0 % (ref 0.0–0.2)

## 2020-01-12 LAB — GLUCOSE, CAPILLARY
Glucose-Capillary: 141 mg/dL — ABNORMAL HIGH (ref 70–99)
Glucose-Capillary: 136 mg/dL — ABNORMAL HIGH (ref 70–99)
Glucose-Capillary: 140 mg/dL — ABNORMAL HIGH (ref 70–99)
Glucose-Capillary: 128 mg/dL — ABNORMAL HIGH (ref 70–99)
Glucose-Capillary: 114 mg/dL — ABNORMAL HIGH (ref 70–99)
Glucose-Capillary: 114 mg/dL — ABNORMAL HIGH (ref 70–99)

## 2020-01-12 LAB — PREPARE CRYOPRECIPITATE
Unit division: 0
Unit division: 0

## 2020-01-12 LAB — PROTIME-INR
INR: 1.7 — ABNORMAL HIGH (ref 0.8–1.2)
Prothrombin Time: 19.5 seconds — ABNORMAL HIGH (ref 11.4–15.2)

## 2020-01-12 LAB — COOXEMETRY PANEL
Carboxyhemoglobin: 1.9 % — ABNORMAL HIGH (ref 0.5–1.5)
Methemoglobin: 0.8 % (ref 0.0–1.5)
O2 Saturation: 79.4 %
Total hemoglobin: 12.9 g/dL (ref 12.0–16.0)

## 2020-01-12 LAB — HEPARIN LEVEL (UNFRACTIONATED): Heparin Unfractionated: 0.18 IU/mL — ABNORMAL LOW (ref 0.30–0.70)

## 2020-01-12 LAB — HEMOGLOBIN AND HEMATOCRIT, BLOOD
HCT: 26.8 % — ABNORMAL LOW (ref 39.0–52.0)
Hemoglobin: 8.5 g/dL — ABNORMAL LOW (ref 13.0–17.0)

## 2020-01-12 LAB — PLATELET COUNT: Platelets: 208 10*3/uL (ref 150–400)

## 2020-01-12 LAB — APTT: aPTT: 40 seconds — ABNORMAL HIGH (ref 24–36)

## 2020-01-12 LAB — LACTATE DEHYDROGENASE: LDH: 286 U/L — ABNORMAL HIGH (ref 98–192)

## 2020-01-12 LAB — ECHO INTRAOPERATIVE TEE
Height: 75 in
Weight: 3770.75 oz

## 2020-01-12 LAB — MAGNESIUM
Magnesium: 2 mg/dL (ref 1.7–2.4)
Magnesium: 2.5 mg/dL — ABNORMAL HIGH (ref 1.7–2.4)

## 2020-01-12 SURGERY — CORONARY ARTERY BYPASS GRAFTING (CABG)
Anesthesia: General | Site: Chest

## 2020-01-12 MED ORDER — PROTAMINE SULFATE 10 MG/ML IV SOLN
INTRAVENOUS | Status: AC
  Filled 2020-01-12: qty 5

## 2020-01-12 MED ORDER — ONDANSETRON HCL 4 MG/2ML IJ SOLN
4.0000 mg | Freq: Four times a day (QID) | INTRAMUSCULAR | Status: DC | PRN
  Administered 2020-02-05 – 2020-02-06 (×3): 4 mg via INTRAVENOUS
  Filled 2020-01-12 (×3): qty 2

## 2020-01-12 MED ORDER — BISACODYL 10 MG RE SUPP
10.0000 mg | Freq: Every day | RECTAL | Status: DC
  Administered 2020-01-14 – 2020-01-16 (×3): 10 mg via RECTAL
  Filled 2020-01-12 (×3): qty 1

## 2020-01-12 MED ORDER — HEPARIN SODIUM (PORCINE) 1000 UNIT/ML IJ SOLN
INTRAMUSCULAR | Status: AC
  Filled 2020-01-12: qty 1

## 2020-01-12 MED ORDER — MAGNESIUM SULFATE 4 GM/100ML IV SOLN
4.0000 g | Freq: Once | INTRAVENOUS | Status: AC
  Administered 2020-01-12: 17:00:00 4 g via INTRAVENOUS
  Filled 2020-01-12: qty 100

## 2020-01-12 MED ORDER — PLASMA-LYTE 148 IV SOLN
INTRAVENOUS | Status: DC | PRN
  Administered 2020-01-12: 10:00:00 500 mL

## 2020-01-12 MED ORDER — FLUOXETINE HCL 20 MG PO CAPS: 40.0000 mg | ORAL_CAPSULE | Freq: Two times a day (BID) | ORAL | Status: DC

## 2020-01-12 MED ORDER — MORPHINE SULFATE (PF) 2 MG/ML IV SOLN: 1.0000 mg | INTRAVENOUS | Status: DC | PRN

## 2020-01-12 MED ORDER — INSULIN REGULAR(HUMAN) IN NACL 100-0.9 UT/100ML-% IV SOLN
INTRAVENOUS | Status: DC | PRN
  Administered 2020-01-12: 5.5 [IU]/h via INTRAVENOUS

## 2020-01-12 MED ORDER — AMIODARONE IV BOLUS ONLY 150 MG/100ML: 150.0000 mg | Freq: Once | INTRAVENOUS | Status: DC

## 2020-01-12 MED ORDER — MIDAZOLAM HCL 2 MG/2ML IJ SOLN
2.0000 mg | INTRAMUSCULAR | Status: DC | PRN
  Administered 2020-01-19 – 2020-01-28 (×2): 2 mg via INTRAVENOUS

## 2020-01-12 MED ORDER — MIDAZOLAM BOLUS VIA INFUSION
1.0000 mg | INTRAVENOUS | Status: DC | PRN
  Administered 2020-01-14 – 2020-01-27 (×4): 1 mg via INTRAVENOUS
  Filled 2020-01-12: qty 2

## 2020-01-12 MED ORDER — VASOPRESSIN 20 UNITS/100 ML INFUSION FOR SHOCK
INTRAVENOUS | Status: DC | PRN
  Administered 2020-01-12: .03 [IU]/min via INTRAVENOUS

## 2020-01-12 MED ORDER — BISACODYL 5 MG PO TBEC
10.0000 mg | DELAYED_RELEASE_TABLET | Freq: Every day | ORAL | Status: DC
  Administered 2020-01-17 – 2020-01-18 (×2): 10 mg via ORAL
  Filled 2020-01-12 (×2): qty 2

## 2020-01-12 MED ORDER — ACETAMINOPHEN 500 MG PO TABS: 1000.0000 mg | ORAL_TABLET | Freq: Four times a day (QID) | ORAL | Status: AC

## 2020-01-12 MED ORDER — LACTATED RINGERS IV SOLN: 500.0000 mL | Freq: Once | INTRAVENOUS | Status: DC | PRN

## 2020-01-12 MED ORDER — NOREPINEPHRINE 4 MG/250ML-% IV SOLN
0.0000 ug/min | INTRAVENOUS | Status: DC
  Filled 2020-01-12: qty 250

## 2020-01-12 MED ORDER — DOCUSATE SODIUM 100 MG PO CAPS: 200.0000 mg | ORAL_CAPSULE | Freq: Every day | ORAL | Status: DC

## 2020-01-12 MED ORDER — DEXMEDETOMIDINE HCL IN NACL 400 MCG/100ML IV SOLN
0.0000 ug/kg/h | INTRAVENOUS | Status: DC
  Administered 2020-01-12 (×2): 0.7 ug/kg/h via INTRAVENOUS
  Filled 2020-01-12: qty 100

## 2020-01-12 MED ORDER — ETOMIDATE 2 MG/ML IV SOLN
INTRAVENOUS | Status: AC
  Filled 2020-01-12: qty 10

## 2020-01-12 MED ORDER — AMIODARONE HCL IN DEXTROSE 360-4.14 MG/200ML-% IV SOLN
60.0000 mg/h | INTRAVENOUS | Status: DC
  Filled 2020-01-12: qty 200

## 2020-01-12 MED ORDER — LEVOFLOXACIN IN D5W 750 MG/150ML IV SOLN
750.0000 mg | INTRAVENOUS | Status: AC
Start: 2020-01-13 — End: 2020-01-13
  Administered 2020-01-13: 11:00:00 750 mg via INTRAVENOUS
  Filled 2020-01-12: qty 150

## 2020-01-12 MED ORDER — ROCURONIUM BROMIDE 10 MG/ML (PF) SYRINGE
PREFILLED_SYRINGE | INTRAVENOUS | Status: AC
  Filled 2020-01-12: qty 10

## 2020-01-12 MED ORDER — METOPROLOL TARTRATE 25 MG/10 ML ORAL SUSPENSION: 12.5000 mg | Freq: Two times a day (BID) | ORAL | Status: DC

## 2020-01-12 MED ORDER — BUPIVACAINE HCL (PF) 0.5 % IJ SOLN
INTRAMUSCULAR | Status: AC
  Filled 2020-01-12: qty 30

## 2020-01-12 MED ORDER — MILRINONE LACTATE IN DEXTROSE 20-5 MG/100ML-% IV SOLN
0.3000 ug/kg/min | INTRAVENOUS | Status: DC
  Administered 2020-01-12: 21:00:00 0.3 ug/kg/min via INTRAVENOUS
  Filled 2020-01-12 (×3): qty 100

## 2020-01-12 MED ORDER — COLCHICINE 0.6 MG PO TABS: 0.6000 mg | ORAL_TABLET | Freq: Every day | ORAL | Status: DC

## 2020-01-12 MED ORDER — EPHEDRINE 5 MG/ML INJ
INTRAVENOUS | Status: AC
  Filled 2020-01-12: qty 10

## 2020-01-12 MED ORDER — BUPIVACAINE LIPOSOME 1.3 % IJ SUSP
20.0000 mL | INTRAMUSCULAR | Status: DC
  Filled 2020-01-12: qty 20

## 2020-01-12 MED ORDER — ROCURONIUM BROMIDE 10 MG/ML (PF) SYRINGE
PREFILLED_SYRINGE | INTRAVENOUS | Status: DC | PRN
  Administered 2020-01-12 (×2): 100 mg via INTRAVENOUS

## 2020-01-12 MED ORDER — HEPARIN SODIUM (PORCINE) 1000 UNIT/ML IJ SOLN
INTRAMUSCULAR | Status: DC | PRN
  Administered 2020-01-12: 38000 [IU] via INTRAVENOUS

## 2020-01-12 MED ORDER — SODIUM CHLORIDE (PF) 0.9 % IJ SOLN
INTRAMUSCULAR | Status: AC
  Filled 2020-01-12: qty 10

## 2020-01-12 MED ORDER — PHENYLEPHRINE 40 MCG/ML (10ML) SYRINGE FOR IV PUSH (FOR BLOOD PRESSURE SUPPORT)
PREFILLED_SYRINGE | INTRAVENOUS | Status: DC | PRN
  Administered 2020-01-12: 160 ug via INTRAVENOUS
  Administered 2020-01-12: 80 ug via INTRAVENOUS

## 2020-01-12 MED ORDER — ETOMIDATE 2 MG/ML IV SOLN
INTRAVENOUS | Status: DC | PRN
  Administered 2020-01-12: 10 mg via INTRAVENOUS

## 2020-01-12 MED ORDER — VECURONIUM BROMIDE 10 MG IV SOLR: 0.0800 mg/kg | INTRAVENOUS | Status: DC | PRN

## 2020-01-12 MED ORDER — NOREPINEPHRINE 16 MG/250ML-% IV SOLN
0.0000 ug/min | INTRAVENOUS | Status: DC
  Administered 2020-01-12: 17:00:00 25 ug/min via INTRAVENOUS
  Administered 2020-01-13: 21:00:00 45 ug/min via INTRAVENOUS
  Administered 2020-01-13: 08:00:00 36 ug/min via INTRAVENOUS
  Administered 2020-01-13 – 2020-01-14 (×2): 40 ug/min via INTRAVENOUS
  Administered 2020-01-14: 23:00:00 27 ug/min via INTRAVENOUS
  Administered 2020-01-14: 13:00:00 28 ug/min via INTRAVENOUS
  Administered 2020-01-15: 13:00:00 14 ug/min via INTRAVENOUS
  Administered 2020-01-16: 07:00:00 9 ug/min via INTRAVENOUS
  Administered 2020-01-17: 01:00:00 17 ug/min via INTRAVENOUS
  Administered 2020-01-17 – 2020-01-18 (×3): 22 ug/min via INTRAVENOUS
  Administered 2020-01-19: 02:00:00 27 ug/min via INTRAVENOUS
  Administered 2020-01-19: 12:00:00 34 ug/min via INTRAVENOUS
  Administered 2020-01-19 (×2): 25 ug/min via INTRAVENOUS
  Administered 2020-01-20: 10:00:00 26 ug/min via INTRAVENOUS
  Administered 2020-01-20: 19:00:00 38 ug/min via INTRAVENOUS
  Administered 2020-01-21: 23:00:00 50 ug/min via INTRAVENOUS
  Administered 2020-01-21: 42 ug/min via INTRAVENOUS
  Administered 2020-01-21: 02:00:00 37 ug/min via INTRAVENOUS
  Administered 2020-01-22: 04:00:00 47 ug/min via INTRAVENOUS
  Administered 2020-01-22: 17:00:00 45 ug/min via INTRAVENOUS
  Administered 2020-01-22: 10:00:00 42 ug/min via INTRAVENOUS
  Administered 2020-01-23: 12:00:00 44 ug/min via INTRAVENOUS
  Administered 2020-01-23: 18:00:00 24 ug/min via INTRAVENOUS
  Administered 2020-01-23: 08:00:00 44 ug/min via INTRAVENOUS
  Administered 2020-01-23: 43 ug/min via INTRAVENOUS
  Administered 2020-01-24: 19:00:00 32 ug/min via INTRAVENOUS
  Administered 2020-01-24: 07:00:00 20 ug/min via INTRAVENOUS
  Administered 2020-01-25: 15:00:00 22 ug/min via INTRAVENOUS
  Administered 2020-01-25: 03:00:00 25 ug/min via INTRAVENOUS
  Administered 2020-01-26: 03:00:00 26 ug/min via INTRAVENOUS
  Administered 2020-01-26: 18:00:00 24 ug/min via INTRAVENOUS
  Administered 2020-01-27: 22:00:00 28 ug/min via INTRAVENOUS
  Administered 2020-01-28: 19:00:00 30 ug/min via INTRAVENOUS
  Administered 2020-01-28: 22 ug/min via INTRAVENOUS
  Administered 2020-01-29: 32 ug/min via INTRAVENOUS
  Administered 2020-01-29: 27 ug/min via INTRAVENOUS
  Administered 2020-01-30: 09:00:00 22 ug/min via INTRAVENOUS
  Administered 2020-01-30: 24 ug/min via INTRAVENOUS
  Administered 2020-01-31: 22:00:00 23 ug/min via INTRAVENOUS
  Administered 2020-01-31: 07:00:00 10 ug/min via INTRAVENOUS
  Administered 2020-02-01: 23 ug/min via INTRAVENOUS
  Administered 2020-02-01: 18 ug/min via INTRAVENOUS
  Administered 2020-02-02: 14 ug/min via INTRAVENOUS
  Administered 2020-02-03 (×2): 80 ug/min via INTRAVENOUS
  Administered 2020-02-03: 53 ug/min via INTRAVENOUS
  Administered 2020-02-03: 76 ug/min via INTRAVENOUS
  Administered 2020-02-03: 05:00:00 60 ug/min via INTRAVENOUS
  Administered 2020-02-03 (×2): 80 ug/min via INTRAVENOUS
  Administered 2020-02-04: 13:00:00 68 ug/min via INTRAVENOUS
  Administered 2020-02-04: 21:00:00 73 ug/min via INTRAVENOUS
  Administered 2020-02-04: 09:00:00 68 ug/min via INTRAVENOUS
  Administered 2020-02-04: 62 ug/min via INTRAVENOUS
  Administered 2020-02-04: 73 ug/min via INTRAVENOUS
  Administered 2020-02-05: 48 ug/min via INTRAVENOUS
  Administered 2020-02-05: 42 ug/min via INTRAVENOUS
  Administered 2020-02-05: 04:00:00 58 ug/min via INTRAVENOUS
  Administered 2020-02-05: 13:00:00 45 ug/min via INTRAVENOUS
  Administered 2020-02-06: 22:00:00 22 ug/min via INTRAVENOUS
  Administered 2020-02-06: 01:00:00 36 ug/min via INTRAVENOUS
  Administered 2020-02-06: 23 ug/min via INTRAVENOUS
  Administered 2020-02-08: 50 ug/min via INTRAVENOUS
  Administered 2020-02-08: 45 ug/min via INTRAVENOUS
  Administered 2020-02-08: 32 ug/min via INTRAVENOUS
  Administered 2020-02-09: 20:00:00 29 ug/min via INTRAVENOUS
  Administered 2020-02-09: 12:00:00 44 ug/min via INTRAVENOUS
  Administered 2020-02-10: 07:00:00 27 ug/min via INTRAVENOUS
  Administered 2020-02-10: 24 ug/min via INTRAVENOUS
  Administered 2020-02-11: 07:00:00 22 ug/min via INTRAVENOUS
  Administered 2020-02-11: 20:00:00 40 ug/min via INTRAVENOUS
  Administered 2020-02-12: 30 ug/min via INTRAVENOUS
  Administered 2020-02-12: 28 ug/min via INTRAVENOUS
  Administered 2020-02-12: 24 ug/min via INTRAVENOUS
  Administered 2020-02-13: 06:00:00 18 ug/min via INTRAVENOUS
  Administered 2020-02-13: 34.027 ug/min via INTRAVENOUS
  Administered 2020-02-14: 24 ug/min via INTRAVENOUS
  Administered 2020-02-14: 20 ug/min via INTRAVENOUS
  Administered 2020-02-14: 25 ug/min via INTRAVENOUS
  Administered 2020-02-15: 23:00:00 40 ug/min via INTRAVENOUS
  Administered 2020-02-15: 38 ug/min via INTRAVENOUS
  Administered 2020-02-15: 25 ug/min via INTRAVENOUS
  Administered 2020-02-16: 23:00:00 31 ug/min via INTRAVENOUS
  Administered 2020-02-16: 14:00:00 30 ug/min via INTRAVENOUS
  Administered 2020-02-16: 05:00:00 40 ug/min via INTRAVENOUS
  Administered 2020-02-17: 21 ug/min via INTRAVENOUS
  Administered 2020-02-18: 30 ug/min via INTRAVENOUS
  Administered 2020-02-18: 22 ug/min via INTRAVENOUS
  Administered 2020-02-18: 75 ug/min via INTRAVENOUS
  Administered 2020-02-18: 63 ug/min via INTRAVENOUS
  Administered 2020-02-19: 26 ug/min via INTRAVENOUS
  Administered 2020-02-19: 34 ug/min via INTRAVENOUS
  Administered 2020-02-20: 10 ug/min via INTRAVENOUS
  Filled 2020-01-12 (×113): qty 250

## 2020-01-12 MED ORDER — OXYCODONE HCL 5 MG PO TABS: 5.0000 mg | ORAL_TABLET | ORAL | Status: DC | PRN

## 2020-01-12 MED ORDER — MIDAZOLAM HCL 2 MG/2ML IJ SOLN
2.0000 mg | INTRAMUSCULAR | Status: DC | PRN
  Administered 2020-01-12 (×2): 2 mg via INTRAVENOUS
  Filled 2020-01-12 (×2): qty 2

## 2020-01-12 MED ORDER — HEMOSTATIC AGENTS (NO CHARGE) OPTIME
TOPICAL | Status: DC | PRN
  Administered 2020-01-12 (×2): 1 via TOPICAL

## 2020-01-12 MED ORDER — EPINEPHRINE HCL 5 MG/250ML IV SOLN IN NS
0.0000 ug/min | INTRAVENOUS | Status: DC
  Administered 2020-01-12 – 2020-01-13 (×3): 10 ug/min via INTRAVENOUS
  Administered 2020-01-13: 8 ug/min via INTRAVENOUS
  Administered 2020-01-14 – 2020-01-18 (×7): 4 ug/min via INTRAVENOUS
  Administered 2020-01-19 (×2): 6 ug/min via INTRAVENOUS
  Administered 2020-01-20: 9 ug/min via INTRAVENOUS
  Administered 2020-01-20: 6 ug/min via INTRAVENOUS
  Administered 2020-01-21: 10 ug/min via INTRAVENOUS
  Administered 2020-01-21: 9 ug/min via INTRAVENOUS
  Administered 2020-01-21 – 2020-01-23 (×5): 10 ug/min via INTRAVENOUS
  Administered 2020-01-23: 8 ug/min via INTRAVENOUS
  Administered 2020-01-23: 12 ug/min via INTRAVENOUS
  Administered 2020-01-24 (×2): 8 ug/min via INTRAVENOUS
  Administered 2020-01-24 – 2020-01-25 (×2): 10 ug/min via INTRAVENOUS
  Administered 2020-01-25: 7 ug/min via INTRAVENOUS
  Administered 2020-01-25 (×2): 10 ug/min via INTRAVENOUS
  Administered 2020-01-26: 7 ug/min via INTRAVENOUS
  Administered 2020-01-26: 4.5 ug/min via INTRAVENOUS
  Administered 2020-01-27 – 2020-01-28 (×2): 3 ug/min via INTRAVENOUS
  Administered 2020-01-29: 5 ug/min via INTRAVENOUS
  Administered 2020-01-29: 3 ug/min via INTRAVENOUS
  Administered 2020-01-30 – 2020-01-31 (×3): 5 ug/min via INTRAVENOUS
  Administered 2020-02-01: 1 ug/min via INTRAVENOUS
  Administered 2020-02-02 – 2020-02-04 (×9): 10 ug/min via INTRAVENOUS
  Administered 2020-02-05: 9 ug/min via INTRAVENOUS
  Administered 2020-02-05: 10 ug/min via INTRAVENOUS
  Administered 2020-02-07: 20 ug/min via INTRAVENOUS
  Administered 2020-02-08: 9 ug/min via INTRAVENOUS
  Administered 2020-02-18: 15 ug/min via INTRAVENOUS
  Filled 2020-01-12 (×22): qty 250
  Filled 2020-01-12: qty 500
  Filled 2020-01-12 (×21): qty 250
  Filled 2020-01-12: qty 500
  Filled 2020-01-12 (×14): qty 250

## 2020-01-12 MED ORDER — ARTIFICIAL TEARS OPHTHALMIC OINT
1.0000 "application " | TOPICAL_OINTMENT | Freq: Three times a day (TID) | OPHTHALMIC | Status: DC
  Administered 2020-01-13 (×2): 1 via OPHTHALMIC
  Filled 2020-01-12: qty 3.5

## 2020-01-12 MED ORDER — SODIUM CHLORIDE 0.9% IV SOLUTION: Freq: Once | INTRAVENOUS | Status: AC

## 2020-01-12 MED ORDER — TRANEXAMIC ACID 1000 MG/10ML IV SOLN
1.5000 mg/kg/h | INTRAVENOUS | Status: DC
  Administered 2020-01-12: 15:00:00 1.5 mg/kg/h via INTRAVENOUS
  Filled 2020-01-12: qty 20

## 2020-01-12 MED ORDER — FENTANYL CITRATE (PF) 100 MCG/2ML IJ SOLN
50.0000 ug | INTRAMUSCULAR | Status: DC | PRN
  Administered 2020-01-16 – 2020-01-19 (×2): 50 ug via INTRAVENOUS

## 2020-01-12 MED ORDER — ASPIRIN EC 325 MG PO TBEC: 325.0000 mg | DELAYED_RELEASE_TABLET | Freq: Every day | ORAL | Status: DC

## 2020-01-12 MED ORDER — METOPROLOL TARTRATE 5 MG/5ML IV SOLN
2.5000 mg | INTRAVENOUS | Status: DC | PRN
Start: 2020-01-12 — End: 2020-03-08

## 2020-01-12 MED ORDER — SODIUM BICARBONATE 8.4 % IV SOLN
50.0000 meq | Freq: Once | INTRAVENOUS | Status: AC
  Administered 2020-01-12: 21:00:00 50 meq via INTRAVENOUS

## 2020-01-12 MED ORDER — VECURONIUM BROMIDE 10 MG IV SOLR
INTRAVENOUS | Status: AC
  Administered 2020-01-12: 22:00:00 10 mg
  Filled 2020-01-12: qty 10

## 2020-01-12 MED ORDER — FENTANYL CITRATE (PF) 100 MCG/2ML IJ SOLN
INTRAMUSCULAR | Status: AC
  Administered 2020-01-12: 21:00:00 50 ug
  Filled 2020-01-12: qty 2

## 2020-01-12 MED ORDER — FENTANYL CITRATE (PF) 100 MCG/2ML IJ SOLN
50.0000 ug | INTRAMUSCULAR | Status: DC | PRN
  Administered 2020-01-21 – 2020-02-05 (×2): 50 ug via INTRAVENOUS
  Administered 2020-02-06 (×2): 100 ug via INTRAVENOUS
  Administered 2020-02-06: 50 ug via INTRAVENOUS

## 2020-01-12 MED ORDER — MIDAZOLAM HCL (PF) 10 MG/2ML IJ SOLN
INTRAMUSCULAR | Status: AC
  Filled 2020-01-12: qty 2

## 2020-01-12 MED ORDER — VASOPRESSIN 20 UNIT/ML IV SOLN
INTRAVENOUS | Status: DC | PRN
  Administered 2020-01-12: 2 [IU] via INTRAVENOUS
  Administered 2020-01-12 (×2): 4 [IU] via INTRAVENOUS
  Administered 2020-01-12: 2 [IU] via INTRAVENOUS
  Administered 2020-01-12 (×7): 4 [IU] via INTRAVENOUS
  Administered 2020-01-12: 2 [IU] via INTRAVENOUS
  Administered 2020-01-12: 4 [IU] via INTRAVENOUS
  Administered 2020-01-12: 2 [IU] via INTRAVENOUS
  Administered 2020-01-12: 4 [IU] via INTRAVENOUS
  Administered 2020-01-12: 2 [IU] via INTRAVENOUS

## 2020-01-12 MED ORDER — HEPARIN SODIUM (PORCINE) 5000 UNIT/ML IJ SOLN
INTRAVENOUS | Status: DC
  Filled 2020-01-12: qty 10

## 2020-01-12 MED ORDER — SODIUM CHLORIDE 0.9 % IV SOLN: 250.0000 mL | INTRAVENOUS | Status: DC

## 2020-01-12 MED ORDER — STERILE WATER FOR INJECTION IJ SOLN
INTRAMUSCULAR | Status: AC
  Filled 2020-01-12: qty 10

## 2020-01-12 MED ORDER — NITROGLYCERIN IN D5W 200-5 MCG/ML-% IV SOLN: 0.0000 ug/min | INTRAVENOUS | Status: DC

## 2020-01-12 MED ORDER — VASOPRESSIN 20 UNIT/ML IV SOLN
INTRAVENOUS | Status: AC
  Filled 2020-01-12: qty 1

## 2020-01-12 MED ORDER — VASOPRESSIN 20 UNITS/100 ML INFUSION FOR SHOCK
0.0000 [IU]/min | INTRAVENOUS | Status: DC
  Administered 2020-01-13 (×2): 0.04 [IU]/min via INTRAVENOUS
  Administered 2020-01-13: 13:00:00 0.06 [IU]/min via INTRAVENOUS
  Administered 2020-01-14: 06:00:00 0.04 [IU]/min via INTRAVENOUS
  Administered 2020-01-14: 16:00:00 0.03 [IU]/min via INTRAVENOUS
  Administered 2020-01-15 – 2020-01-27 (×35): 0.04 [IU]/min via INTRAVENOUS
  Administered 2020-01-28: 0.03 [IU]/min via INTRAVENOUS
  Administered 2020-01-28 – 2020-02-12 (×27): 0.04 [IU]/min via INTRAVENOUS
  Administered 2020-02-12: 0.03 [IU]/min via INTRAVENOUS
  Administered 2020-02-12: 0.04 [IU]/min via INTRAVENOUS
  Administered 2020-02-13 (×2): 0.03 [IU]/min via INTRAVENOUS
  Administered 2020-02-14: 0.02 [IU]/min via INTRAVENOUS
  Administered 2020-02-14: 0.03 [IU]/min via INTRAVENOUS
  Administered 2020-02-15: 0.02 [IU]/min via INTRAVENOUS
  Filled 2020-01-12 (×18): qty 100
  Filled 2020-01-12: qty 200
  Filled 2020-01-12 (×50): qty 100

## 2020-01-12 MED ORDER — PHENYLEPHRINE HCL-NACL 20-0.9 MG/250ML-% IV SOLN: 0.0000 ug/min | INTRAVENOUS | Status: DC

## 2020-01-12 MED ORDER — LACTATED RINGERS IV SOLN: INTRAVENOUS | Status: DC

## 2020-01-12 MED ORDER — METOPROLOL TARTRATE 12.5 MG HALF TABLET: 12.5000 mg | ORAL_TABLET | Freq: Two times a day (BID) | ORAL | Status: DC

## 2020-01-12 MED ORDER — CALCIUM CHLORIDE 10 % IV SOLN
INTRAVENOUS | Status: DC | PRN
  Administered 2020-01-12: 1 g via INTRAVENOUS

## 2020-01-12 MED ORDER — POTASSIUM CHLORIDE 10 MEQ/50ML IV SOLN: 10.0000 meq | INTRAVENOUS | Status: AC

## 2020-01-12 MED ORDER — PROPOFOL 10 MG/ML IV BOLUS
INTRAVENOUS | Status: AC
  Filled 2020-01-12: qty 20

## 2020-01-12 MED ORDER — CHLORHEXIDINE GLUCONATE 0.12 % MT SOLN: 15.0000 mL | OROMUCOSAL | Status: AC

## 2020-01-12 MED ORDER — ALBUMIN HUMAN 5 % IV SOLN
250.0000 mL | INTRAVENOUS | Status: AC | PRN
  Administered 2020-01-12 – 2020-01-13 (×3): 12.5 g via INTRAVENOUS
  Filled 2020-01-12 (×3): qty 250

## 2020-01-12 MED ORDER — SODIUM CHLORIDE 0.9% FLUSH
3.0000 mL | INTRAVENOUS | Status: DC | PRN
  Administered 2020-01-24: 10:00:00 3 mL via INTRAVENOUS

## 2020-01-12 MED ORDER — VANCOMYCIN HCL 1000 MG IV SOLR
INTRAVENOUS | Status: DC | PRN
  Administered 2020-01-12: 3 g via TOPICAL

## 2020-01-12 MED ORDER — VANCOMYCIN HCL 1000 MG IV SOLR
INTRAVENOUS | Status: AC
  Filled 2020-01-12: qty 3000

## 2020-01-12 MED ORDER — SODIUM CHLORIDE 0.9 % IV SOLN: INTRAVENOUS | Status: DC

## 2020-01-12 MED ORDER — 0.9 % SODIUM CHLORIDE (POUR BTL) OPTIME
TOPICAL | Status: DC | PRN
  Administered 2020-01-12: 10:00:00 5000 mL

## 2020-01-12 MED ORDER — LIDOCAINE 2% (20 MG/ML) 5 ML SYRINGE
INTRAMUSCULAR | Status: AC
  Filled 2020-01-12: qty 5

## 2020-01-12 MED ORDER — PLATELET RICH PLASMA OPTIME
Status: DC | PRN
  Administered 2020-01-12: 11:00:00 10 mL

## 2020-01-12 MED ORDER — MIDAZOLAM 50MG/50ML (1MG/ML) PREMIX INFUSION
0.0000 mg/h | INTRAVENOUS | Status: DC
  Administered 2020-01-12: 23:00:00 2 mg/h via INTRAVENOUS
  Administered 2020-01-13 (×2): 5 mg/h via INTRAVENOUS
  Administered 2020-01-14: 09:00:00 1 mg/h via INTRAVENOUS
  Administered 2020-01-17: 23:00:00 3 mg/h via INTRAVENOUS
  Administered 2020-01-18 – 2020-01-20 (×6): 4 mg/h via INTRAVENOUS
  Administered 2020-01-21: 23:00:00 0.8 mg/h via INTRAVENOUS
  Administered 2020-01-22: 23:00:00 3 mg/h via INTRAVENOUS
  Administered 2020-01-23: 23:00:00 2 mg/h via INTRAVENOUS
  Administered 2020-01-25: 17:00:00 3 mg/h via INTRAVENOUS
  Administered 2020-01-25: 06:00:00 2 mg/h via INTRAVENOUS
  Administered 2020-01-26: 06:00:00 3 mg/h via INTRAVENOUS
  Filled 2020-01-12 (×19): qty 50

## 2020-01-12 MED ORDER — PLATELET POOR PLASMA OPTIME
Status: DC | PRN
  Administered 2020-01-12: 11:00:00 10 mL

## 2020-01-12 MED ORDER — FENTANYL CITRATE (PF) 100 MCG/2ML IJ SOLN
INTRAMUSCULAR | Status: DC | PRN
  Administered 2020-01-12 (×2): 200 ug via INTRAVENOUS
  Administered 2020-01-12: 250 ug via INTRAVENOUS

## 2020-01-12 MED ORDER — SODIUM CHLORIDE 0.9% FLUSH
3.0000 mL | Freq: Two times a day (BID) | INTRAVENOUS | Status: DC
  Administered 2020-01-13: 21:00:00 10 mL via INTRAVENOUS
  Administered 2020-01-13 – 2020-02-03 (×32): 3 mL via INTRAVENOUS

## 2020-01-12 MED ORDER — DIVALPROEX SODIUM ER 500 MG PO TB24: 500.0000 mg | ORAL_TABLET | Freq: Every day | ORAL | Status: DC

## 2020-01-12 MED ORDER — DOCUSATE SODIUM 50 MG/5ML PO LIQD
100.0000 mg | Freq: Two times a day (BID) | ORAL | Status: DC
  Administered 2020-01-13 – 2020-01-18 (×10): 100 mg
  Filled 2020-01-12 (×10): qty 10

## 2020-01-12 MED ORDER — PROTAMINE SULFATE 10 MG/ML IV SOLN
INTRAVENOUS | Status: DC | PRN
  Administered 2020-01-12: 380 mg via INTRAVENOUS

## 2020-01-12 MED ORDER — LACTATED RINGERS IV SOLN: INTRAVENOUS | Status: DC | PRN

## 2020-01-12 MED ORDER — PHENYLEPHRINE 40 MCG/ML (10ML) SYRINGE FOR IV PUSH (FOR BLOOD PRESSURE SUPPORT)
PREFILLED_SYRINGE | INTRAVENOUS | Status: AC
  Filled 2020-01-12: qty 10

## 2020-01-12 MED ORDER — ACETAMINOPHEN 160 MG/5ML PO SOLN
1000.0000 mg | Freq: Four times a day (QID) | ORAL | Status: AC
  Administered 2020-01-13 – 2020-01-17 (×15): 1000 mg
  Filled 2020-01-12 (×16): qty 40.6

## 2020-01-12 MED ORDER — ALBUMIN HUMAN 5 % IV SOLN: INTRAVENOUS | Status: DC | PRN

## 2020-01-12 MED ORDER — TRANEXAMIC ACID 1000 MG/10ML IV SOLN
INTRAVENOUS | Status: DC | PRN
  Administered 2020-01-12: 09:00:00 1.5 mg/kg/h via INTRAVENOUS

## 2020-01-12 MED ORDER — MIDAZOLAM HCL 5 MG/5ML IJ SOLN
INTRAMUSCULAR | Status: DC | PRN
  Administered 2020-01-12: 3 mg via INTRAVENOUS
  Administered 2020-01-12: 4 mg via INTRAVENOUS
  Administered 2020-01-12: 3 mg via INTRAVENOUS

## 2020-01-12 MED ORDER — VANCOMYCIN HCL IN DEXTROSE 1-5 GM/200ML-% IV SOLN
1000.0000 mg | Freq: Once | INTRAVENOUS | Status: AC
  Administered 2020-01-12: 22:00:00 1000 mg via INTRAVENOUS
  Filled 2020-01-12: qty 200

## 2020-01-12 MED ORDER — TRAMADOL HCL 50 MG PO TABS: 50.0000 mg | ORAL_TABLET | ORAL | Status: DC | PRN

## 2020-01-12 MED ORDER — EPINEPHRINE PF 1 MG/ML IJ SOLN
INTRAVENOUS | Status: DC | PRN
  Administered 2020-01-12: 13:00:00 5 ug/min via INTRAVENOUS

## 2020-01-12 MED ORDER — ACETAMINOPHEN 650 MG RE SUPP
650.0000 mg | Freq: Once | RECTAL | Status: AC
  Administered 2020-01-12: 19:00:00 650 mg via RECTAL

## 2020-01-12 MED ORDER — FENTANYL 2500MCG IN NS 250ML (10MCG/ML) PREMIX INFUSION
25.0000 ug/h | INTRAVENOUS | Status: DC
  Administered 2020-01-12: 23:00:00 100 ug/h via INTRAVENOUS
  Administered 2020-01-13 – 2020-01-14 (×2): 125 ug/h via INTRAVENOUS
  Administered 2020-01-14: 23:00:00 200 ug/h via INTRAVENOUS
  Administered 2020-01-15: 13:00:00 100 ug/h via INTRAVENOUS
  Administered 2020-01-17: 23:00:00 150 ug/h via INTRAVENOUS
  Administered 2020-01-17: 04:00:00 125 ug/h via INTRAVENOUS
  Administered 2020-01-18 – 2020-01-21 (×4): 150 ug/h via INTRAVENOUS
  Administered 2020-01-22 – 2020-01-23 (×4): 200 ug/h via INTRAVENOUS
  Administered 2020-01-24: 04:00:00 150 ug/h via INTRAVENOUS
  Administered 2020-01-24 – 2020-01-25 (×2): 175 ug/h via INTRAVENOUS
  Administered 2020-01-25: 23:00:00 200 ug/h via INTRAVENOUS
  Administered 2020-01-26: 15:00:00 100 ug/h via INTRAVENOUS
  Administered 2020-01-27: 200 ug/h via INTRAVENOUS
  Administered 2020-01-28: 125 ug/h via INTRAVENOUS
  Administered 2020-01-28: 150 ug/h via INTRAVENOUS
  Administered 2020-01-29: 125 ug/h via INTRAVENOUS
  Administered 2020-01-30: 175 ug/h via INTRAVENOUS
  Administered 2020-01-30: 150 ug/h via INTRAVENOUS
  Administered 2020-01-31 – 2020-02-01 (×2): 125 ug/h via INTRAVENOUS
  Administered 2020-02-02: 150 ug/h via INTRAVENOUS
  Filled 2020-01-12 (×33): qty 250

## 2020-01-12 MED ORDER — FENTANYL CITRATE (PF) 100 MCG/2ML IJ SOLN: 25.0000 ug | Freq: Once | INTRAMUSCULAR | Status: DC

## 2020-01-12 MED ORDER — SODIUM CHLORIDE 0.45 % IV SOLN: INTRAVENOUS | Status: DC | PRN

## 2020-01-12 MED ORDER — PANTOPRAZOLE SODIUM 40 MG PO TBEC: 40.0000 mg | DELAYED_RELEASE_TABLET | Freq: Every day | ORAL | Status: DC

## 2020-01-12 MED ORDER — PLASMA-LYTE A IV SOLN: INTRAVENOUS | Status: DC

## 2020-01-12 MED ORDER — THROMBIN 5000 UNITS EX SOLR
INTRAVENOUS | Status: DC | PRN
  Administered 2020-01-12: 11:00:00 2 mL

## 2020-01-12 MED ORDER — FAMOTIDINE IN NACL 20-0.9 MG/50ML-% IV SOLN
20.0000 mg | Freq: Two times a day (BID) | INTRAVENOUS | Status: AC
  Administered 2020-01-12 (×2): 20 mg via INTRAVENOUS
  Filled 2020-01-12 (×2): qty 50

## 2020-01-12 MED ORDER — SODIUM BICARBONATE 8.4 % IV SOLN
100.0000 meq | Freq: Once | INTRAVENOUS | Status: AC
  Administered 2020-01-12: 16:00:00 100 meq via INTRAVENOUS

## 2020-01-12 MED ORDER — INSULIN REGULAR(HUMAN) IN NACL 100-0.9 UT/100ML-% IV SOLN
INTRAVENOUS | Status: DC
  Administered 2020-01-13: 07:00:00 2.4 [IU]/h via INTRAVENOUS
  Administered 2020-01-15: 07:00:00 1.5 [IU]/h via INTRAVENOUS
  Administered 2020-01-17: 12:00:00 7 [IU]/h via INTRAVENOUS
  Administered 2020-01-18: 12:00:00 5 [IU]/h via INTRAVENOUS
  Administered 2020-01-19: 12:00:00 2.6 [IU]/h via INTRAVENOUS
  Administered 2020-01-19: 23:00:00 6 [IU]/h via INTRAVENOUS
  Administered 2020-01-20: 23:00:00 2.6 [IU]/h via INTRAVENOUS
  Administered 2020-01-23: 07:00:00 1.1 [IU]/h via INTRAVENOUS
  Administered 2020-01-25: 21:00:00 1.9 [IU]/h via INTRAVENOUS
  Administered 2020-01-26: 02:00:00 3.4 [IU]/h via INTRAVENOUS
  Administered 2020-01-26: 22:00:00 3.8 [IU]/h via INTRAVENOUS
  Filled 2020-01-12 (×13): qty 100

## 2020-01-12 MED ORDER — BUSPIRONE HCL 10 MG PO TABS: 10.0000 mg | ORAL_TABLET | Freq: Three times a day (TID) | ORAL | Status: DC

## 2020-01-12 MED ORDER — NOREPINEPHRINE 4 MG/250ML-% IV SOLN
INTRAVENOUS | Status: DC | PRN
  Administered 2020-01-12: 2 ug/min via INTRAVENOUS

## 2020-01-12 MED ORDER — FENTANYL CITRATE (PF) 250 MCG/5ML IJ SOLN
INTRAMUSCULAR | Status: AC
  Filled 2020-01-12: qty 25

## 2020-01-12 MED ORDER — BUPIVACAINE LIPOSOME 1.3 % IJ SUSP
INTRAMUSCULAR | Status: DC | PRN
  Administered 2020-01-12: 10:00:00 20 mL

## 2020-01-12 MED ORDER — MIDAZOLAM HCL 2 MG/2ML IJ SOLN
2.0000 mg | INTRAMUSCULAR | Status: DC | PRN
  Administered 2020-01-28 – 2020-01-29 (×2): 2 mg via INTRAVENOUS
  Filled 2020-01-12 (×3): qty 2

## 2020-01-12 MED ORDER — AMIODARONE IV BOLUS ONLY 150 MG/100ML
150.0000 mg | Freq: Once | INTRAVENOUS | Status: AC
  Administered 2020-01-13: 12:00:00 150 mg via INTRAVENOUS
  Filled 2020-01-12: qty 100

## 2020-01-12 MED ORDER — ASPIRIN 81 MG PO CHEW: 324.0000 mg | CHEWABLE_TABLET | Freq: Every day | ORAL | Status: DC

## 2020-01-12 MED ORDER — POLYETHYLENE GLYCOL 3350 17 G PO PACK
17.0000 g | PACK | Freq: Every day | ORAL | Status: DC
  Administered 2020-01-14 – 2020-01-18 (×5): 17 g
  Filled 2020-01-12 (×4): qty 1

## 2020-01-12 MED ORDER — DEXTROSE 50 % IV SOLN: 0.0000 mL | INTRAVENOUS | Status: DC | PRN

## 2020-01-12 MED ORDER — FENTANYL BOLUS VIA INFUSION
25.0000 ug | INTRAVENOUS | Status: DC | PRN
  Administered 2020-01-16 – 2020-02-02 (×11): 25 ug via INTRAVENOUS
  Filled 2020-01-12: qty 25

## 2020-01-12 MED ORDER — DEXMEDETOMIDINE HCL IN NACL 400 MCG/100ML IV SOLN
0.1000 ug/kg/h | INTRAVENOUS | Status: DC
  Filled 2020-01-12: qty 100

## 2020-01-12 MED ORDER — SODIUM CHLORIDE 0.9 % IV SOLN: INTRAVENOUS | Status: DC | PRN

## 2020-01-12 MED ORDER — DEXMEDETOMIDINE HCL IN NACL 400 MCG/100ML IV SOLN
INTRAVENOUS | Status: DC | PRN
  Administered 2020-01-12: .4 ug/kg/h via INTRAVENOUS

## 2020-01-12 MED FILL — Heparin Sodium (Porcine) Inj 1000 Unit/ML: INTRAMUSCULAR | Qty: 30 | Status: AC

## 2020-01-12 MED FILL — Magnesium Sulfate Inj 50%: INTRAMUSCULAR | Qty: 10 | Status: AC

## 2020-01-12 MED FILL — Potassium Chloride Inj 2 mEq/ML: INTRAVENOUS | Qty: 40 | Status: AC

## 2020-01-12 SURGICAL SUPPLY — 120 items
ADAPTER CARDIO PERF ANTE/RETRO (ADAPTER) ×4 IMPLANT
ADH SKN CLS APL DERMABOND .7 (GAUZE/BANDAGES/DRESSINGS) ×6
ADPR PRFSN 84XANTGRD RTRGD (ADAPTER) ×3
ADPR TBG 2 MALE LL ART (MISCELLANEOUS) ×3
APPLIER CLIP 9.375 SM OPEN (CLIP) ×4
APR CLP SM 9.3 20 MLT OPN (CLIP) ×3
BAG DECANTER FOR FLEXI CONT (MISCELLANEOUS) ×4 IMPLANT
BLADE CLIPPER SURG (BLADE) ×4 IMPLANT
BLADE CORE FAN STRYKER (BLADE) ×4 IMPLANT
BLADE SURG 15 STRL LF DISP TIS (BLADE) ×3 IMPLANT
BLADE SURG 15 STRL SS (BLADE) ×4
BNDG ELASTIC 4X5.8 VLCR STR LF (GAUZE/BANDAGES/DRESSINGS) ×4 IMPLANT
BNDG ELASTIC 6X5.8 VLCR STR LF (GAUZE/BANDAGES/DRESSINGS) ×4 IMPLANT
BNDG GAUZE ELAST 4 BULKY (GAUZE/BANDAGES/DRESSINGS) ×4 IMPLANT
CANISTER SUCT 3000ML PPV (MISCELLANEOUS) ×4 IMPLANT
CANNULA NON VENT 22FR 12 (CANNULA) ×4 IMPLANT
CATH CPB KIT HENDRICKSON (MISCELLANEOUS) ×4 IMPLANT
CATH INFINITI 6F MPB2 (CATHETERS) ×4 IMPLANT
CATH ROBINSON RED A/P 18FR (CATHETERS) ×12 IMPLANT
CLIP APPLIE 9.375 SM OPEN (CLIP) ×3 IMPLANT
CLIP RETRACTION 3.0MM CORONARY (MISCELLANEOUS) IMPLANT
CLIP VESOCCLUDE MED 24/CT (CLIP) IMPLANT
CLIP VESOCCLUDE SM WIDE 24/CT (CLIP) IMPLANT
COVER MAYO STAND STRL (DRAPES) IMPLANT
DERMABOND ADVANCED (GAUZE/BANDAGES/DRESSINGS) ×2
DERMABOND ADVANCED .7 DNX12 (GAUZE/BANDAGES/DRESSINGS) ×6 IMPLANT
DRAIN CHANNEL 28F RND 3/8 FF (WOUND CARE) ×16 IMPLANT
DRAPE CARDIOVASCULAR INCISE (DRAPES) ×4
DRAPE EXTREMITY T 121X128X90 (DISPOSABLE) IMPLANT
DRAPE SLUSH/WARMER DISC (DRAPES) ×4 IMPLANT
DRAPE SRG 135X102X78XABS (DRAPES) ×3 IMPLANT
DRSG AQUACEL AG ADV 3.5X14 (GAUZE/BANDAGES/DRESSINGS) ×4 IMPLANT
DRSG TEGADERM 4X4.5 CHG (GAUZE/BANDAGES/DRESSINGS) ×4 IMPLANT
ELECT BLADE 4.0 EZ CLEAN MEGAD (MISCELLANEOUS) ×4
ELECT CAUTERY BLADE 6.4 (BLADE) ×4 IMPLANT
ELECT REM PT RETURN 9FT ADLT (ELECTROSURGICAL) ×8
ELECTRODE BLDE 4.0 EZ CLN MEGD (MISCELLANEOUS) ×3 IMPLANT
ELECTRODE REM PT RTRN 9FT ADLT (ELECTROSURGICAL) ×6 IMPLANT
FELT TEFLON 1X6 (MISCELLANEOUS) ×4 IMPLANT
GAUZE SPONGE 4X4 12PLY STRL (GAUZE/BANDAGES/DRESSINGS) ×8 IMPLANT
GAUZE SPONGE 4X4 12PLY STRL LF (GAUZE/BANDAGES/DRESSINGS) ×8 IMPLANT
GEL ULTRASOUND 20GR AQUASONIC (MISCELLANEOUS) ×4 IMPLANT
GLOVE BIO SURGEON STRL SZ 6.5 (GLOVE) ×8 IMPLANT
GLOVE BIO SURGEON STRL SZ7.5 (GLOVE) ×8 IMPLANT
GLOVE BIO SURGEON STRL SZ8 (GLOVE) ×4 IMPLANT
GLOVE NEODERM STRL 7.5  LF PF (GLOVE) ×6
GLOVE NEODERM STRL 7.5 LF PF (GLOVE) ×6 IMPLANT
GLOVE SURG NEODERM 7.5  LF PF (GLOVE) ×2
GLOVE SURG UNDER POLY LF SZ6.5 (GLOVE) ×16 IMPLANT
GOWN STRL REUS W/ TWL LRG LVL3 (GOWN DISPOSABLE) ×12 IMPLANT
GOWN STRL REUS W/TWL LRG LVL3 (GOWN DISPOSABLE) ×16
INSERT FOGARTY XLG (MISCELLANEOUS) ×4 IMPLANT
INSERT SUTURE HOLDER (MISCELLANEOUS) ×4 IMPLANT
IV ADAPTER SYR DOUBLE MALE LL (MISCELLANEOUS) ×4 IMPLANT
KIT BASIN OR (CUSTOM PROCEDURE TRAY) ×4 IMPLANT
KIT SUCTION CATH 14FR (SUCTIONS) ×4 IMPLANT
KIT TURNOVER KIT B (KITS) ×4 IMPLANT
KIT VASOVIEW HEMOPRO 2 VH 4000 (KITS) ×4 IMPLANT
MARKER GRAFT CORONARY BYPASS (MISCELLANEOUS) IMPLANT
NEEDLE 18GX1X1/2 (RX/OR ONLY) (NEEDLE) ×4 IMPLANT
NEEDLE PERC 18GX7CM (NEEDLE) ×4 IMPLANT
NS IRRIG 1000ML POUR BTL (IV SOLUTION) ×20 IMPLANT
PACK CHEST (CUSTOM PROCEDURE TRAY) ×4 IMPLANT
PACK E OPEN HEART (SUTURE) ×4 IMPLANT
PACK OPEN HEART (CUSTOM PROCEDURE TRAY) ×4 IMPLANT
PACK SPY-PHI (KITS) ×4 IMPLANT
PAD ARMBOARD 7.5X6 YLW CONV (MISCELLANEOUS) ×8 IMPLANT
PAD ELECT DEFIB RADIOL ZOLL (MISCELLANEOUS) ×4 IMPLANT
PENCIL BUTTON HOLSTER BLD 10FT (ELECTRODE) ×4 IMPLANT
POSITIONER HEAD DONUT 9IN (MISCELLANEOUS) ×4 IMPLANT
POWDER SURGICEL 3.0 GRAM (HEMOSTASIS) ×4 IMPLANT
PUNCH AORTIC ROTATE 4.5MM 8IN (MISCELLANEOUS) ×4 IMPLANT
SEALANT SURG COSEAL 8ML (VASCULAR PRODUCTS) ×4 IMPLANT
SET CARDIOPLEGIA MPS 5001102 (MISCELLANEOUS) ×4 IMPLANT
SET MICROPUNCTURE 5F STIFF (MISCELLANEOUS) ×4 IMPLANT
SHEATH PINNACLE 8F 10CM (SHEATH) ×12 IMPLANT
STAPLER VISISTAT 35W (STAPLE) ×4 IMPLANT
STOPCOCK 4 WAY LG BORE MALE ST (IV SETS) ×4 IMPLANT
SUPPORT HEART JANKE-BARRON (MISCELLANEOUS) ×4 IMPLANT
SUT BONE WAX W31G (SUTURE) ×4 IMPLANT
SUT ETHIBOND X763 2 0 SH 1 (SUTURE) ×8 IMPLANT
SUT ETHILON 3 0 PS 1 (SUTURE) ×8 IMPLANT
SUT MNCRL AB 3-0 PS2 18 (SUTURE) ×8 IMPLANT
SUT PDS AB 1 CTX 36 (SUTURE) ×8 IMPLANT
SUT PROLENE 3 0 SH DA (SUTURE) ×16 IMPLANT
SUT PROLENE 4 0 RB 1 (SUTURE) ×8
SUT PROLENE 4 0 SH DA (SUTURE) ×8 IMPLANT
SUT PROLENE 4-0 RB1 .5 CRCL 36 (SUTURE) ×6 IMPLANT
SUT PROLENE 5 0 C 1 36 (SUTURE) ×8 IMPLANT
SUT PROLENE 6 0 C 1 30 (SUTURE) ×16 IMPLANT
SUT PROLENE 7 0 BV1 MDA (SUTURE) ×4 IMPLANT
SUT PROLENE 8 0 BV175 6 (SUTURE) IMPLANT
SUT PROLENE BLUE 7 0 (SUTURE) ×4 IMPLANT
SUT SILK  1 MH (SUTURE) ×4
SUT SILK 1 MH (SUTURE) ×3 IMPLANT
SUT SILK 2 0 SH CR/8 (SUTURE) IMPLANT
SUT SILK 3 0 SH CR/8 (SUTURE) IMPLANT
SUT STEEL 6MS V (SUTURE) ×4 IMPLANT
SUT STEEL STERNAL CCS#1 18IN (SUTURE) ×4 IMPLANT
SUT STEEL SZ 6 DBL 3X14 BALL (SUTURE) ×4 IMPLANT
SUT VIC AB 1 CTX 36 (SUTURE) ×8
SUT VIC AB 1 CTX36XBRD ANBCTR (SUTURE) ×6 IMPLANT
SUT VIC AB 2-0 CT1 27 (SUTURE) ×4
SUT VIC AB 2-0 CT1 TAPERPNT 27 (SUTURE) ×3 IMPLANT
SUT VIC AB 2-0 CTX 27 (SUTURE) IMPLANT
SUT VIC AB 3-0 SH 27 (SUTURE)
SUT VIC AB 3-0 SH 27X BRD (SUTURE) IMPLANT
SUT VIC AB 3-0 X1 27 (SUTURE) IMPLANT
SYR 10ML LL (SYRINGE) ×4 IMPLANT
SYR 50ML SLIP (SYRINGE) IMPLANT
SYSTEM SAHARA CHEST DRAIN ATS (WOUND CARE) ×8 IMPLANT
TAPE CLOTH SURG 4X10 WHT LF (GAUZE/BANDAGES/DRESSINGS) ×4 IMPLANT
TAPE PAPER 2X10 WHT MICROPORE (GAUZE/BANDAGES/DRESSINGS) ×4 IMPLANT
TOWEL GREEN STERILE (TOWEL DISPOSABLE) ×4 IMPLANT
TOWEL GREEN STERILE FF (TOWEL DISPOSABLE) ×4 IMPLANT
TRAY FOLEY SLVR 16FR TEMP STAT (SET/KITS/TRAYS/PACK) ×4 IMPLANT
TUBING ART PRESS 48 MALE/FEM (TUBING) ×8 IMPLANT
TUBING LAP HI FLOW INSUFFLATIO (TUBING) ×4 IMPLANT
UNDERPAD 30X36 HEAVY ABSORB (UNDERPADS AND DIAPERS) ×4 IMPLANT
WATER STERILE IRR 1000ML POUR (IV SOLUTION) ×8 IMPLANT

## 2020-01-12 NOTE — Progress Notes (Signed)
NAME:  Corey Palmer, MRN:  [id number], DOB:  01/09/1971, LOS: 68 ADMISSION DATE:  01/09/2020, CONSULTATION DATE: 01/11/2020 REFERRING MD: Aundra Dubin, CHIEF COMPLAINT: Respiratory failure post Impella  HPI/course in hospital  49 year old man underwent Impella 5.5 insertion today for persistent symptoms of forward failure with low cardiac indices.  He presented last week for ICD shocks and was found to be in rapid atrial fibrillation for which he was cardioverted.  He was also found to be in decompensated heart failure with an ejection fraction of 15% and multivessel coronary disease on left heart catheterization.  He was started on tropes and diuresed which improved his congestive symptoms but his index remained low so an Impella was placed to optimize his hemodynamics prior to a high risk redo-bypass.  Uneventful Impella implantation.  Patient returned from operating room intubated.  He had to go back to the OR for displacement of Impella 12/12.  Underwent CABGx4 today.  Returned to ICU on significant inotropic support.   Past Medical History   Past Medical History:  Diagnosis Date  . Anginal pain ([place])   . Anxiety   . Arthritis   . Automatic implantable cardioverter-defibrillator in situ 2007   [employer]   . Bipolar disorder ([place])   . CAD (coronary artery disease)    Cardiac cath (08/19/2000) - Nonobstructive, 40% stenosis of LAD,.   . Cancer [facility]) 2013   benign stomach cancer  . CHF (congestive heart failure) ([place])   . Chondrocalcinosis of right knee 06/11/2013  . Chronic systolic heart failure ([place])    2D echo (81/1572) - Systolic function severely reduced., LV EF 20-25%. Akinesis of apical and anteroseptal mycoardium.  last 2D echo (11/2008 ), LV EF 25-30%, diffuse hypokinesis, and grade 1 diastolic dysfunction.  . Depression   . Diabetes uncomplicated adult-type II    on insulin therapy, a1c 12.9 in 01/2011  . Dilated cardiomyopathy ([place])    status post ICD placement in 2008 c/b  tearing at the atrial junction resulting in tamponade and urgent thoracotomy  . Dyslipidemia   . Dysrhythmia   . GERD (gastroesophageal reflux disease)   . Gout, unspecified   . Hepatic steatosis 2011   seen on ultrasound.   . History of kidney stones   . Hx of echocardiogram    Echo (04/2013):  Mild LVH, EF 20-25%, mild LAE, mild to mod RVE with mild reduced RVSF.  Marland Kitchen Hypertension   . Obesity (BMI 30.0-34.9)   . Pacemaker   . Paroxysmal atrial fibrillation (H[facility])    on chronic coumadin, goal INR 2-3. status post direct current  . Peptic ulcer   . Shortness of breath   . Sleep apnea    does not have CPAP     Past Surgical History:  Procedure Laterality Date  . BIOPSY  12/01/2019   Procedure: BIOPSY;  Surgeon: Arta Silence, MD;  Location: [facility] ENDOSCOPY;  Service: Endoscopy;;  . CARDIAC CATHETERIZATION    . CARDIAC PACEMAKER PLACEMENT    . CHOLECYSTECTOMY    . COLONOSCOPY    . CORONARY ARTERY BYPASS GRAFT    . ESOPHAGOGASTRODUODENOSCOPY  04/05/2011   Procedure: ESOPHAGOGASTRODUODENOSCOPY (EGD);  Surgeon: Irene Shipper, MD;  Location: [facility] ENDOSCOPY;  Service: Endoscopy;  Laterality: N/A;  . ESOPHAGOGASTRODUODENOSCOPY (EGD) WITH PROPOFOL N/A 12/01/2019   Procedure: ESOPHAGOGASTRODUODENOSCOPY (EGD) WITH PROPOFOL;  Surgeon: Arta Silence, MD;  Location: [facility] ENDOSCOPY;  Service: Endoscopy;  Laterality: N/A;  . JOINT REPLACEMENT     left knee replacement  . KNEE ARTHROSCOPY  Right 06/11/2013   Procedure: RIGHT KNEE ARTHROSCOPY with chondroplasty;  Surgeon: Johnny Bridge, MD;  Location: [place];  Service: Orthopedics;  Laterality: Right;  . PLACEMENT OF IMPELLA LEFT VENTRICULAR ASSIST DEVICE Right 01/01/2020   Procedure: AXILLARY  ARTERY PLACEMENT OF ABIOMED IMPELLA  5.5 LEFT VENTRICULAR ASSIST DEVICE;  Surgeon: [name], MD;  Location: [place];  Service: Open Heart Surgery;  Laterality: Right;  . PLACEMENT OF IMPELLA LEFT VENTRICULAR ASSIST DEVICE N/A 01/01/2020   Procedure: PLACEMENT  OF IMPELLA LEFT VENTRICULAR ASSIST DEVICE;  Surgeon: [name], MD;  Location: [place];  Service: Open Heart Surgery;  Laterality: N/A;  . RIGHT/LEFT HEART CATH AND CORONARY ANGIOGRAPHY N/A 01/23/2020   Procedure: RIGHT/LEFT HEART CATH AND CORONARY ANGIOGRAPHY;  Surgeon: Larey Dresser, MD;  Location: [place] [facility];  Service: Cardiovascular;  Laterality: N/A;  . TOTAL KNEE ARTHROPLASTY Left       Interim history/subjective:   Called to bedside for hypotension with MAP in 50's following change in heart rhythm   Objective   Blood pressure (!) 81/57, pulse (!) 110, temperature (!) 100.4 F (38 C), temperature source Core, resp. rate (!) 24, height 6\' 3"  (1.905 m), weight 106.9 kg, SpO2 100 %. PAP: (37-61)/(13-32) 38/22 CVP:  [0 mmHg-48 mmHg] 13 mmHg CO:  [5.9 L/min-6.8 L/min] 6.5 L/min CI:  [2.6 L/min/m2-3 L/min/m2] 2.8 L/min/m2  Vent Mode: SIMV;PRVC;PSV FiO2 (%):  [60 %-100 %] 70 % Set Rate:  [12 bmp-22 bmp] 22 bmp Vt Set:  [640 mL] 640 mL PEEP:  [5 cmH20-7 cmH20] 7 cmH20 Pressure Support:  [10 cmH20] 10 cmH20 Plateau Pressure:  [29 cmH20] 29 cmH20   Intake/Output Summary (Last 24 hours) at 12/31/2019 2216 Last data filed at 01/07/2020 2200 Gross per 24 hour  Intake 6450.88 ml  Output 2800 ml  Net 3650.88 ml   Filed Weights   01/18/2020 0400 01/10/20 0500 01/11/20 0630  Weight: 104.6 kg 107 kg 106.9 kg   PAP: (37-61)/(13-32) 38/22 CVP:  [0 mmHg-48 mmHg] 13 mmHg CO:  [5.9 L/min-6.8 L/min] 6.5 L/min CI:  [2.6 L/min/m2-3 L/min/m2] 2.8 L/min/m2  Examination: Physical exam: General: intubated and sedated.  HEENT: ETT, OGT in place Neuro: Sedated but will follow commands.  Chest: Median sternotomy, right subclavian Impella in place. Chest clear. Heart: Distant heart sounds. Chest tube drainage has been slowing. Bursts of WCT consistent with AF  Abdomen: Soft, nontender, nondistended, bowel sounds present Skin: No rash  Ancillary tests (personally reviewed)   CBC: Recent Labs  Lab 01/10/20 0421 01/11/20 0224 01/09/2020 0426 01/15/2020 0921 01/07/2020 1242 12/29/2019 1307 01/04/2020 1316 01/01/2020 1355 01/08/2020 1400 01/28/2020 1518 12/30/2019 2026  WBC 11.0* 14.1* 13.6*  --   --   --   --   --   --  29.6* 19.9*  HGB 13.2 13.2 12.3*   < > 8.5*   < > 9.5* 9.9* 9.9* 9.1* 6.4*  HCT 39.2 40.8 38.7*   < > 26.8*   < > 28.0* 29.0* 29.0* 28.0* 20.8*  MCV 80.0 81.3 82.2  --   --   --   --   --   --  85.1 85.2  PLT 237 254 257  --  208  --   --   --   --  231 200   < > = values in this interval not displayed.    Basic Metabolic Panel: Recent Labs  Lab 01/24/2020 0428 01/03/2020 0831 01/23/2020 1127 01/10/20 0421 01/11/20 0224 01/11/20 2202  01/11/2020 0426 01/07/2020 0921 01/07/2020 1114 01/02/2020 1126 01/15/2020 1201 01/17/2020 1237 01/05/2020 1307 01/18/2020 1316 01/11/2020 1355 01/27/2020 1400 01/14/2020 2026  NA 132*  --    < > 132*  --  133* 130*   < > 130*   < > 134*   < > 130* 131* 133* 132* 132*  K 3.6  --    < > 3.8  --  3.7 4.0   < > 4.6   < > 4.9   < > 5.6* 5.6* 4.7 4.7 4.1  CL 98  --    < > 100  --  98 98   < > 97*  --  97*  --  98  --   --  98 101  CO2 24  --   --  21*  --  22 23  --   --   --   --   --   --   --   --   --  20*  GLUCOSE 150*  --    < > 113*  --  145* 152*   < > 186*  --  177*  --  155*  --   --  166* 146*  BUN 42*  --    < > 30*  --  22* 25*   < > 25*  --  24*  --  25*  --   --  26* 24*  CREATININE 1.55*  --    < > 1.41*  --  1.36* 1.44*   < > 1.30*  --  1.20  --  1.20  --   --  1.30* 1.48*  CALCIUM 8.8*  --   --  8.5*  --  8.8* 8.7*  --   --   --   --   --   --   --   --   --  9.9  MG  --  1.7  --  1.7 1.9  --  2.0  --   --   --   --   --   --   --   --   --  2.5*   < > = values in this interval not displayed.   GFR: Estimated Creatinine Clearance: 78.1 mL/min (A) (by C-G formula based on SCr of 1.48 mg/dL (H)). Recent Labs  Lab 01/11/20 0224 01/04/2020 0426 01/21/2020 1518 01/06/2020 2026  WBC 14.1* 13.6* 29.6* 19.9*    Liver Function  Tests: No results for input(s): AST, ALT, ALKPHOS, BILITOT, PROT, ALBUMIN in the last 168 hours. No results for input(s): LIPASE, AMYLASE in the last 168 hours. No results for input(s): AMMONIA in the last 168 hours.  ABG    Component Value Date/Time   PHART 7.168 (LL) 01/13/2020 1355   PCO2ART 63.9 (H) 01/26/2020 1355   PO2ART 97 12/31/2019 1355   HCO3 23.2 01/26/2020 1355   TCO2 22 01/23/2020 1400   ACIDBASEDEF 6.0 (H) 01/06/2020 1355   O2SAT 95.0 01/24/2020 1355     Coagulation Profile: Recent Labs  Lab 01/21/2020 1518  INR 1.7*    Cardiac Enzymes: No results for input(s): CKTOTAL, CKMB, CKMBINDEX, TROPONINI in the last 168 hours.  HbA1C: Hemoglobin A1C  Date/Time Value Ref Range Status  12/01/2019 04:04 PM 10.8 (A) 4.0 - 5.6 % Final  02/03/2019 04:24 PM 12.5 (A) 4.0 - 5.6 % Final   Hgb A1c MFr Bld  Date/Time Value Ref Range Status  01/03/2020 03:41 PM 10.6 (H) 4.8 - 5.6 %  Final    Comment:    (NOTE) Pre diabetes:          5.7%-6.4%  Diabetes:              >6.4%  Glycemic control for   <7.0% adults with diabetes   10/01/2018 04:02 PM 11.9 (H) 4.8 - 5.6 % Final    Comment:    (NOTE) Pre diabetes:          5.7%-6.4% Diabetes:              >6.4% Glycemic control for   <7.0% adults with diabetes     CBG: Recent Labs  Lab 01/25/2020 0744 01/22/2020 1533 01/08/2020 1653 01/21/2020 1756 01/11/2020 2002  GLUCAP 136* 140* 128* 114* 114*    Assessment & Plan:  Acute hypoxic respiratory failure Cardiogenic shock Acute on chronic systolic heart failure requiring mechanical cardiac support with Impala and titration of inotropes Severe coronary artery disease Bipolar affective Diabetes type 2 Paroxysmal atrial fibrillation requiring amiodarone infusion.  Acute kidney injury-improving cardiorenal syndrome  Hyponatremia Hypomagnesemia  Worsening hypotension post op associated with WCT. - overdrive AV pacing at 450. - Bolus amiodarone and keep amiodarone at  60  Increasing CVP, suggestive of RV dysfunction. TEE confirms decreased RV function without dilatation, consistent with post CPB stunning.  - continue epi and milrinone at current doses - increase NO to 40.  HB down to 6.4, no evidence of tamponade on echo.  - transfuse 2 units - recheck BMP and CBC at midnight.  Post CPB vasoplegia. - Increase NE to 40  - Increase Impella to P8   Interventions have improved MAP  Will keep patient sedated and paralyzed to avoid shifts in intrathoracic pressure and PEEP dropped to 5.   Plan is to support RV through period of stunning.   Daily Goals Checklist  Pain/Anxiety/Delirium protocol (if indicated): versed and fentanyl infusions.  VAP protocol (if indicated): N/A DVT prophylaxis: SCD GI prophylaxis: Protonix Glucose control: Basal and bolus insulin  Code Status: Full code Family Communication: Per primary team Disposition: ICU   CRITICAL CARE Performed by: Kipp Brood   Total critical care time: 60 minutes  Critical care time was exclusive of separately billable procedures and treating other patients.  Critical care was necessary to treat or prevent imminent or life-threatening deterioration.  Critical care was time spent personally by me on the following activities: development of treatment plan with patient and/or surrogate as well as nursing, discussions with consultants, evaluation of patient's response to treatment, examination of patient, obtaining history from patient or surrogate, ordering and performing treatments and interventions, ordering and review of laboratory studies, ordering and review of radiographic studies, pulse oximetry, re-evaluation of patient's condition and participation in multidisciplinary rounds.  Kipp Brood, MD [facility] ICU Physician [place]  Pager: [phone] Mobile: [phone] After hours: [phone].

## 2020-01-12 NOTE — Procedures (Signed)
Bronchoscopy Procedure Note  Corey Palmer  [id number]  08-27-1970  Date:01/21/2020  Time:4:19 PM   Provider Performing:Corey Palmer   Procedure(s):  Flexible Bronchoscopy (90383)  Indication(s) RUL collapse, s/p CABG  Consent Unable to obtain consent due to emergent nature of procedure.  Anesthesia get   Time Out Verified patient identification, verified procedure, site/side was marked, verified correct patient position, special equipment/implants available, medications/allergies/relevant history reviewed, required imaging and test results available.   Sterile Technique Usual hand hygiene, masks, gowns, and gloves were used   Procedure Description Bronchoscope advanced through endotracheal tube and into airway.  Airways were examined down to subsegmental level with findings noted below.   Following diagnostic evaluation, BAL(s) performed in right mainstem with normal saline and return of thick sputum fluid  Findings: clear airways after toilet bronchoscopy   Complications/Tolerance None; patient tolerated the procedure well. Chest X-ray is needed post procedure.   EBL none   Specimen(s) none

## 2020-01-12 NOTE — Progress Notes (Addendum)
  Echocardiogram Transesophageal echo has been performed.  Corey Palmer 01/11/2020, 9:55 PM

## 2020-01-12 NOTE — Anesthesia Procedure Notes (Signed)
Procedure Name: Intubation Date/Time: 01/11/2020 10:28 AM Performed by: Kathryne Hitch, CRNA Pre-anesthesia Checklist: Patient identified, Emergency Drugs available, Suction available and Patient being monitored Patient Re-evaluated:Patient Re-evaluated prior to induction Oxygen Delivery Method: Circle system utilized Preoxygenation: Pre-oxygenation with 100% oxygen Induction Type: IV induction Ventilation: Mask ventilation without difficulty Laryngoscope Size: Miller and 3 Grade View: Grade I Tube type: Oral Tube size: 8.0 mm Number of attempts: 1 Airway Equipment and Method: Stylet and Oral airway Placement Confirmation: ETT inserted through vocal cords under direct vision,  positive ETCO2 and breath sounds checked- equal and bilateral Secured at: 23 cm Tube secured with: Tape Dental Injury: Teeth and Oropharynx as per pre-operative assessment

## 2020-01-12 NOTE — Progress Notes (Signed)
TCTS BRIEF SICU PROGRESS NOTE  Day of Surgery  S/P Procedure(s) (LRB): REDO STERNOTOMY (N/A) CORONARY ARTERY BYPASS GRAFTING (CABG), ON PUMP, TIMES FOUR, USING LEFT INTERNAL MAMMARY ARTERY AND ENDOSCOPICALLY HARVESTED RIGHT GREATER SAPHENOUS VEIN (N/A) TRANSESOPHAGEAL ECHOCARDIOGRAM (TEE) (N/A) INDOCYANINE GREEN FLUORESCENCE IMAGING (ICG) (N/A)   Called by nurses at bedside regarding patient's change in heart rhythm, which was initially described as possibly VF or VT but they could not tell.  I stated that they should proceed with DC cardioversion immediately if the patient was in VF.  They responded that they weren't sure and the rhythm was "bizarre" so they were getting a 12 lead ECG.  I asked if the patient still had pulsatile flow on the [place] and they replied that the patient's MAP was in the 50's.  I again asked if there was pulsatile flow or not because if not the rhythm might be VF and the patient should be cardioverted.  I stated that if there was pulsatile flow they should bolus amiodarone.  At that point the nurse hung up the telephone on me.  I immediately came to the hospital.  Upon my arrival Dr Lynetta Mare was at the bedside and patient was in VVV paced rhythm w/ intermittent periods of WCT c/w likely AF with aberrant conduction.  MAP 60's PA 42/20 CVP 18 on Epi 10 Levo 32 Vaso 0.4 with Impella flow 3.8-3.9 L/min on P7.  Attempts to increase to P8 generated suction alarms.  STAT bloods sent to lab, pending.  Hgb <6 on iStat.  Chest tube output since surgery relatively low w/ no signs of significant bleeding.  Bedside portable ECHO w/ extremely poor windows but no obvious signs of pericardial tamponade and LV relatively underfilled.  2 units PRBCs requested and repeat bolus amiodarone given.  Discussed w/ Dr Aundra Dubin who plans TEE at bedside.   Rexene Alberts, MD 01/15/2020 8:48 PM

## 2020-01-12 NOTE — Progress Notes (Signed)
1 unit FFP administered per Dr [name], verified with Marcene Brawn, RN

## 2020-01-12 NOTE — Progress Notes (Addendum)
Patient ID: Corey Palmer, male   DOB: March 20, 1970, 49 y.o.   MRN: [id number]     Advanced Heart Failure Rounding Note  PCP-Cardiologist: No primary care provider on file.   Subjective:    - 12/7 Torsades -ICD shock x1.  - 12/11 Impella 5.5 placed.  - 12/12 Impella repositioned in OR  He continues on milrinone gtt 0.125 and heparin gtt.  Creatinine stable 1.44.   He remains in NSR on amiodarone gtt.   No further nausea. .   WBC elevated, 7.4>>11>>14.1>>13.3. AF. CXR shows no PNA.  Swan numbers: CVP 11 PA 54/21 CI 3.8 Co-ox 79%  Impella 5.5 P8 Flow 4.3 Good waveforms No alarms this morning  Echo (12/10): EF <20%, moderate LV dilation, normal RV size with mildly decreased systolic function.   RHC/LHC: Coronary Findings   Diagnostic Dominance: Right  Left Main  30% distal left main stenosis.  Left Anterior Descending  40% ostial LAD stenosis. 90% stenosis proximal LAD just distal to D1 take-off. Large D1 with 99% proximal stenosis, 60% mid vessel stenosis.  Left Circumflex  Long up to 70% proximal LCx stenosis.  Right Coronary Artery  95% mid RCA stenosis. 75% distal RCA stenosis. 60% stenosis proximal PLV. 99% stenosis mid PDA.   Intervention   No interventions have been documented.  Right Heart  Right Heart Pressures RHC Procedural Findings: Hemodynamics (mmHg) RA mean 16 RV 53/12 PA 65/29, mean 46 PCWP mean 31 LV 119/28 AO 118/87  Oxygen saturations: PA 45% AO 93%  Cardiac Output (Fick) 3.39  Cardiac Index (Fick) 1.4 PVR 4.4 WU PAPi 2.25     Objective:   Weight Range: 106.9 kg Body mass index is 29.46 kg/m.   Vital Signs:   Temp:  [97.34 F (36.3 C)-99.32 F (37.4 C)] 99.32 F (37.4 C) (12/15 0700) Pulse Rate:  [91-109] 100 (12/15 0700) Resp:  [6-28] 16 (12/15 0700) BP: (90-138)/(56-102) 108/93 (12/15 0700) SpO2:  [91 %-99 %] 92 % (12/15 0700) Arterial Line BP: (88-159)/(53-100) 104/71 (12/15 0700) Last BM Date:  01/11/20  Weight change: Filed Weights   01/20/2020 0400 01/10/20 0500 01/11/20 0630  Weight: 104.6 kg 107 kg 106.9 kg    Intake/Output:   Intake/Output Summary (Last 24 hours) at 01/02/2020 0739 Last data filed at 12/31/2019 0620 Gross per 24 hour  Intake 1922.71 ml  Output 1557 ml  Net 365.71 ml      Physical Exam   CVP 11  General: NAD Neck: JVP 10 cm, no thyromegaly or thyroid nodule.  Lungs: Clear to auscultation bilaterally with normal respiratory effort. CV: Nondisplaced PMI.  Heart regular S1/S2, no S3/S4, no murmur.  No peripheral edema.   Abdomen: Soft, nontender, no hepatosplenomegaly, no distention.  Skin: Intact without lesions or rashes.  Neurologic: Alert and oriented x 3.  Psych: Normal affect. Extremities: No clubbing or cyanosis.  HEENT: Normal.    Telemetry    NSR 90s, personally reviewed   Labs    CBC Recent Labs    01/11/20 0224 01/13/2020 0426  WBC 14.1* 13.6*  HGB 13.2 12.3*  HCT 40.8 38.7*  MCV 81.3 82.2  PLT 254 270   Basic Metabolic Panel Recent Labs    01/11/20 0224 01/11/20 0843 01/03/2020 0426  NA  --  133* 130*  K  --  3.7 4.0  CL  --  98 98  CO2  --  22 23  GLUCOSE  --  145* 152*  BUN  --  22* 25*  CREATININE  --  1.36* 1.44*  CALCIUM  --  8.8* 8.7*  MG 1.9  --  2.0   Liver Function Tests No results for input(s): AST, ALT, ALKPHOS, BILITOT, PROT, ALBUMIN in the last 72 hours. No results for input(s): LIPASE, AMYLASE in the last 72 hours. Cardiac Enzymes No results for input(s): CKTOTAL, CKMB, CKMBINDEX, TROPONINI in the last 72 hours.  BNP: BNP (last 3 results) Recent Labs    01/17/2020 1619  BNP 577.5*    ProBNP (last 3 results) No results for input(s): PROBNP in the last 8760 hours.   D-Dimer No results for input(s): DDIMER in the last 72 hours. Hemoglobin A1C No results for input(s): HGBA1C in the last 72 hours. Fasting Lipid Panel No results for input(s): CHOL, HDL, LDLCALC, TRIG, CHOLHDL, LDLDIRECT  in the last 72 hours. Thyroid Function Tests No results for input(s): TSH, T4TOTAL, T3FREE, THYROIDAB in the last 72 hours.  Invalid input(s): FREET3  Other results:   Imaging    No results found.   Medications:     Scheduled Medications: . sodium chloride   Intravenous Once  . aspirin EC  81 mg Oral Daily  . atorvastatin  80 mg Oral Daily  . bupivacaine liposome  20 mL Infiltration To OR  . busPIRone  10 mg Oral TID  . Chlorhexidine Gluconate Cloth  6 each Topical Daily  . colchicine  0.6 mg Oral Daily  . dapagliflozin propanediol  10 mg Oral Daily  . dicyclomine  10 mg Oral TID AC  . digoxin  0.125 mg Oral Daily  . divalproex  500 mg Oral QHS  . docusate sodium  100 mg Oral BID  . epinephrine  0-10 mcg/min Intravenous To OR  . FLUoxetine  40 mg Oral BID  . heparin-papaverine-plasmalyte irrigation   Irrigation To OR  . insulin aspart  0-15 Units Subcutaneous Q4H  . insulin glargine  10 Units Subcutaneous Daily  . insulin   Intravenous To OR  . lidocaine  1 patch Transdermal Q24H  . losartan  25 mg Oral Daily  . magnesium oxide  200 mg Oral Daily  . magnesium sulfate  40 mEq Other To OR  . mouth rinse  15 mL Mouth Rinse BID  . metoCLOPramide (REGLAN) injection  10 mg Intravenous Q8H  . phenylephrine  30-200 mcg/min Intravenous To OR  . potassium chloride  80 mEq Other To OR  . sodium chloride flush  10-40 mL Intracatheter Q12H  . sodium chloride flush  10-40 mL Intracatheter Q12H  . sodium chloride flush  3 mL Intravenous Q12H  . spironolactone  25 mg Oral Daily  . tranexamic acid  15 mg/kg Intravenous To OR  . tranexamic acid  2 mg/kg Intracatheter To OR    Infusions: . sodium chloride Stopped (01/06/20 1204)  . albumin human Stopped (01/28/2020 1434)  . amiodarone 30 mg/hr (01/27/2020 0600)  . dexmedetomidine    . heparin 30,000 units/NS 1000 mL solution for CELLSAVER    . heparin 850 Units/hr (01/17/2020 0600)  . impella catheter heparin 50 unit/mL in dextrose  5%    . hydrALAZINE    . levofloxacin (LEVAQUIN) IV    . milrinone 0.125 mcg/kg/min (01/27/2020 0600)  . milrinone    . nitroGLYCERIN Stopped (01/11/20 1923)  . nitroGLYCERIN    . norepinephrine    . tranexamic acid (CYKLOKAPRON) infusion ([facility])    . vancomycin      PRN Medications: sodium chloride, sodium chloride, acetaminophen, albumin human, ALPRAZolam, fentaNYL (SUBLIMAZE) injection, hydrALAZINE, ondansetron (  ZOFRAN) IV, oxyCODONE-acetaminophen, oxymetazoline, sodium chloride, sodium chloride flush, sodium chloride flush, temazepam, traMADol     Assessment/Plan   1. Atrial fibrillation: H/o PAF.  He was on dofetilide in the remote past but this was stopped due to noncompliance. He had an upper GI bleed from antral ulcers in 3/12. He was seen by GI and was deemed safe to restart anticoagulation as long as he remains on a PPI.  No apparent recurrence of AF until just prior to this admission, was cardioverted back to NSR in ER but went back into atrial fibrillation.  Now he has returned to NSR.  - Continue amiodarone gtt 30 mg/hr.  - Continue heparin gtt.  - Maze with CABG today.  2. CAD: Nonobstructive mild CAD on remote cath in 2002. He has had chest pain but is pleuritic.  HS-TnI 64 => 92, suspect demand ischemia with volume overload and afib/RVR.   Cath this admission with severe 3 vessel disease, ideally treated by CABG given extent of disease.  If no CABG, could approach with multivessel PCI but would be complex and need Impella support.  - Continue atorvastatin, ASA.  - Dr. [name] consulting, current plan for high-risk CABG today, supported by Impella 5.5.  - Unable to do cardiac MRI for viability given old ICD leads.  However, no thinning of the myocardium noted.  3. Acute on chronic systolic CHF: Initially nonischemic cardiomyopathy. Possible familial cardiomyopathy as both parents had cardiomyopathy and died at around 41.However, [employer] gene testing did not show any common  mutation for cardiomyopathy.  However, this admission noted to have severe 3 vessel disease so suspect component of ischemic cardiomyopathy. [employer] Jude ICD. Echo in 8/20 with EF 15% and mildly decreased RV function. Echo this admission with EF < 20%, moderate LV dilation, RV mildly reduced, severe LAE, no significant MR.Low output HF with markedly low EF.  He has a long history of cardiomyopathy (20 yrs), tends to minimize symptoms.  Cardiorenal syndrome with creatinine up to 2.2, now stabilized on milrinone and Impella 5.5. SCr 1.44.  This morning, Co-ox 79% with CVP 11. CI 3.8  - Continue Impella P8.  Heparin gtt running, LDH 286.    - CVP and PA pressure higher today, discussed with anesthesia, may need dose of Lasix.  - Continue spironolactone 25 daily.  - On milrinone 0.125. Cardiac output has stabilized on Impella.  - Hold diuretics today w/ low CVP  - Continue dig 0.125 mg daily. Level ok recently.     - Continue dapagliflozin.  - Coreg held with low output.   - Continue Unna boots.  - As above, considering high risk CABG with severe 3VD => Plan today.  LVAD would be bail-out.  4. OSA: OSA on sleep study, has not been using CPAP.  - Start CPAP here.  5. AKI on CKD stage ?3: Last creatinine prior to admission was 1.38.  Creatinine peaked 2.2 this admission.  Suspect cardiorenal syndrome. Creatinine down to 1.44.   - Holding Lasix with CVP 4.   6. Diabetes: hgbA1c 10.6.  - Insulin sliding scale.  - Consulted diabetes coordinator.  - Followed by Dr Loanne Drilling.  7. Hyponatremia: Hypervolemic hyponatremia: Fluid restrict.  8. Torsades/ICD shock: 12/7/212 in hospital event, in setting of severe hypokalemia and hypomagnesemia.  - Follow K and Mg closely.  - Continue IV amio  9. Gout: h/o gout. Complains of rt knee pain c/w previous flares - treated w/ prednisone burst (completed).  - Continue colchicine 0.6 (takes with  relief at home).  10. Right shoulder pain: Impella site, no significant  hematoma noted.  - Pain control.  11. Leukocytosis: WBC mildly elevated.  CXR today negative for infiltrates. Has foley   - follow closely   CRITICAL CARE Performed by: Loralie Champagne  Total critical care time: 35 minutes  Critical care time was exclusive of separately billable procedures and treating other patients.  Critical care was necessary to treat or prevent imminent or life-threatening deterioration.  Critical care was time spent personally by me on the following activities: development of treatment plan with patient and/or surrogate as well as nursing, discussions with consultants, evaluation of patient's response to treatment, examination of patient, obtaining history from patient or surrogate, ordering and performing treatments and interventions, ordering and review of laboratory studies, ordering and review of radiographic studies, pulse oximetry and re-evaluation of patient's condition.   Loralie Champagne 01/05/2020 7:39 AM

## 2020-01-12 NOTE — Progress Notes (Signed)
Patient ID: Corey Palmer, male   DOB: 03-22-1970, 50 y.o.   MRN: [id number]  Called to bedside due to cardiogenic shock with worsening hypotension. Patient on high doses of epinephrine and norepinephrine, on vasopressin 0.04 with MAP in 50s.  Hgb 6.4 on CBC, electrolytes stable and patient not acidotic on ABG. Rhythm with wide complex in 120s initially, decreased to 110s with amiodarone bolus and increased gtt.  Suspect atrial fibrillation with aberrancy.   2 units PRBCs called for and infusion begun.     TEE done at bedside, RV mildly dilated but severely dysfunction.  LV with EF 20%, Impella short and advanced to around 4.6 cm for stable positioning.  Patient sedated/paralyzed.  Initially, suction alarms on Impella P8, but with advancement he stabilized at P8 with flow 4.3 L.  With RV dysfunction, CVP around 18-20.    NO increased to 30 ppm.    With the above adjustments, MAP increased to 65-70 range.   Discussed with Drs Roxy Manns and Agarwala and patient's wife updated.   CRITICAL CARE Performed by: Loralie Champagne  Total critical care time: 45 minutes  Critical care time was exclusive of separately billable procedures and treating other patients.  Critical care was necessary to treat or prevent imminent or life-threatening deterioration.  Critical care was time spent personally by me on the following activities: development of treatment plan with patient and/or surrogate as well as nursing, discussions with consultants, evaluation of patient's response to treatment, examination of patient, obtaining history from patient or surrogate, ordering and performing treatments and interventions, ordering and review of laboratory studies, ordering and review of radiographic studies, pulse oximetry and re-evaluation of patient's condition.  Loralie Champagne 01/28/2020 10:00 PM

## 2020-01-12 NOTE — CV Procedure (Signed)
Procedure: TEE  Indication: Cardiogenic shock, unable to obtain images by TTE.  Sedation: Per CCM  Findings: Please see echo section for full report.  Moderately dilated LV with normal wall thickness.  EF 15-20%, diffuse hypokinesis.  Impella catheter present in LV.  Mildly dilated RV with severe systolic dysfunction.  Moderate TR.  Pacing wires noted in right heart.  Moderate left atrial enlargement, no LA appendage thrombus.  Mild right atrial enlargement.  Trileaflet aortic valve, unable to comment on regurgitation or stenosis due to presence of Impella.  Mild mitral regurgitation.    Impella position adjusted under echo guidance.   Loralie Champagne 01/27/2020 10:05 PM

## 2020-01-12 NOTE — Brief Op Note (Signed)
01/08/2020 - 01/18/2020  1:11 PM  PATIENT:  Corey Palmer  49 y.o. male  PRE-OPERATIVE DIAGNOSIS:  Coronary Artery Disease Congestive Heart Failure  POST-OPERATIVE DIAGNOSIS:  Coronary Artery Disease Congestive Heart Failure  PROCEDURE:  Procedure(s): REDO STERNOTOMY (N/A) CORONARY ARTERY BYPASS GRAFTING (CABG), ON PUMP, TIMES FOUR , USING LEFT INTERNAL MAMMARY ARTERY AND ENDOSCOPICALLY HARVESTED RIGHT GREATER SAPHENOUS VEIN (N/A) TRANSESOPHAGEAL ECHOCARDIOGRAM (TEE) (N/A) INDOCYANINE GREEN FLUORESCENCE IMAGING (ICG) (N/A) LIMA-LAD  SEQ SVG-PD-PL SVG-OM EVH 55 MINUTES  SURGEON:  Surgeon(s) and Role:    * Javionna Leder, Glenice Bow, MD - Primary  PHYSICIAN ASSISTANT: WAYNE GOLD PA-C  ASSISTANTS: STAFF   ANESTHESIA:   general  EBL: per anes  BLOOD ADMINISTERED:per anes record  DRAINS: BILAT PLEURAL AND MEDIASTINAL CHEST TUBES   LOCAL MEDICATIONS USED:  NONE  SPECIMEN:  No Specimen  DISPOSITION OF SPECIMEN:  N/A  COUNTS:  YES  TOURNIQUET:  * No tourniquets in log *  DICTATION: .Dragon Dictation  PLAN OF CARE: Admit to inpatient   PATIENT DISPOSITION:  ICU - intubated and hemodynamically stable.   Delay start of Pharmacological VTE agent (>24hrs) due to surgical blood loss or risk of bleeding: yes  COMPLICATIONS: NO KNOWN

## 2020-01-12 NOTE — Transfer of Care (Signed)
Immediate Anesthesia Transfer of Care Note  Patient: Corey Palmer  Procedure(s) Performed: REDO STERNOTOMY (N/A ) CORONARY ARTERY BYPASS GRAFTING (CABG), ON PUMP, TIMES FOUR, USING LEFT INTERNAL MAMMARY ARTERY AND ENDOSCOPICALLY HARVESTED RIGHT GREATER SAPHENOUS VEIN (N/A Chest) TRANSESOPHAGEAL ECHOCARDIOGRAM (TEE) (N/A ) INDOCYANINE GREEN FLUORESCENCE IMAGING (ICG) (N/A )  Patient Location: PACU  Anesthesia Type:General  Level of Consciousness: Patient remains intubated per anesthesia plan  Airway & Oxygen Therapy: Patient remains intubated per anesthesia plan and Patient placed on Ventilator (see vital sign flow sheet for setting)  Post-op Assessment: Report given to RN and Post -op Vital signs reviewed and stable  Post vital signs: Reviewed and stable  Last Vitals:  Vitals Value Taken Time  BP 104/71   Temp    Pulse 99   Resp    SpO2 99     Last Pain:  Vitals:   01/18/2020 0725  TempSrc:   PainSc: 0-No pain      Patients Stated Pain Goal: 1 (61/22/44 9753)  Complications: No complications documented.

## 2020-01-12 NOTE — H&P (Signed)
History and Physical Interval Note:  01/21/2020 8:05 AM  Corey Palmer  has presented today for surgery, with the diagnosis of coronary artery disease.  The various methods of treatment have been discussed with the patient and family. After consideration of risks, benefits and other options for treatment, the patient has consented to  PROCEDURE: redo median sternotomy; coronary artery bypass grafting and associated procedures. as a surgical intervention.  [name] history has been reviewed, patient examined, no change in status, stable for surgery.  I have reviewed the patient's chart and labs.  Questions were answered to the patient's satisfaction.     [name]

## 2020-01-12 NOTE — Anesthesia Postprocedure Evaluation (Signed)
Anesthesia Post Note  Patient: Corey Palmer  Procedure(s) Performed: REDO STERNOTOMY (N/A ) CORONARY ARTERY BYPASS GRAFTING (CABG), ON PUMP, TIMES FOUR, USING LEFT INTERNAL MAMMARY ARTERY AND ENDOSCOPICALLY HARVESTED RIGHT GREATER SAPHENOUS VEIN (N/A Chest) TRANSESOPHAGEAL ECHOCARDIOGRAM (TEE) (N/A ) INDOCYANINE GREEN FLUORESCENCE IMAGING (ICG) (N/A )     Patient location during evaluation: SICU Anesthesia Type: General Level of consciousness: sedated Pain management: pain level controlled Vital Signs Assessment: post-procedure vital signs reviewed and stable Respiratory status: patient remains intubated per anesthesia plan Cardiovascular status: stable Postop Assessment: no apparent nausea or vomiting Anesthetic complications: no   No complications documented.  Last Vitals:  Vitals:   01/15/2020 1618 01/25/2020 1639  BP:    Pulse:    Resp:    Temp:    SpO2: 100% 100%    Last Pain:  Vitals:   12/31/2019 0725  TempSrc:   PainSc: 0-No pain                 Brentney Goldbach P Malayasia Mirkin

## 2020-01-12 NOTE — Progress Notes (Signed)
TCTS BRIEF SICU PROGRESS NOTE  Day of Surgery  S/P Procedure(s) (LRB): REDO STERNOTOMY (N/A) CORONARY ARTERY BYPASS GRAFTING (CABG), ON PUMP, TIMES FOUR, USING LEFT INTERNAL MAMMARY ARTERY AND ENDOSCOPICALLY HARVESTED RIGHT GREATER SAPHENOUS VEIN (N/A) TRANSESOPHAGEAL ECHOCARDIOGRAM (TEE) (N/A) INDOCYANINE GREEN FLUORESCENCE IMAGING (ICG) (N/A)   Sedated on vent NSR w/ PAC's MAP 75 on max dose vasopressin and Epi, stable Impella flows CVP 13 O2 sats 100% and repeat CXR w/ reexpansion RUL after bronch Chest tube output low UOP adequate Labs okay  Plan: Continue current plan  Rexene Alberts, MD 01/03/2020 5:02 PM

## 2020-01-12 NOTE — Progress Notes (Signed)
  Echocardiogram Echocardiogram Transesophageal has been performed.  Corey Palmer 01/05/2020, 9:53 AM

## 2020-01-13 ENCOUNTER — Inpatient Hospital Stay (HOSPITAL_COMMUNITY): Payer: BC Managed Care – PPO | Admitting: Certified Registered Nurse Anesthetist

## 2020-01-13 ENCOUNTER — Inpatient Hospital Stay (HOSPITAL_COMMUNITY): Payer: BC Managed Care – PPO

## 2020-01-13 ENCOUNTER — Inpatient Hospital Stay (HOSPITAL_COMMUNITY): Payer: Self-pay | Admitting: Certified Registered"

## 2020-01-13 ENCOUNTER — Encounter (HOSPITAL_COMMUNITY): Payer: Self-pay | Admitting: Cardiothoracic Surgery

## 2020-01-13 ENCOUNTER — Encounter (HOSPITAL_COMMUNITY): Admission: EM | Disposition: E | Payer: Self-pay | Source: Home / Self Care | Attending: Cardiothoracic Surgery

## 2020-01-13 ENCOUNTER — Inpatient Hospital Stay (HOSPITAL_COMMUNITY): Admission: EM | Disposition: E | Payer: Self-pay | Source: Home / Self Care | Attending: Cardiothoracic Surgery

## 2020-01-13 ENCOUNTER — Encounter (HOSPITAL_COMMUNITY): Payer: Self-pay | Admitting: Certified Registered"

## 2020-01-13 DIAGNOSIS — Z951 Presence of aortocoronary bypass graft: Secondary | ICD-10-CM

## 2020-01-13 DIAGNOSIS — I97638 Postprocedural hematoma of a circulatory system organ or structure following other circulatory system procedure: Secondary | ICD-10-CM

## 2020-01-13 HISTORY — PX: EXPLORATION POST OPERATIVE OPEN HEART: SHX5061

## 2020-01-13 HISTORY — PX: TEE WITHOUT CARDIOVERSION: SHX5443

## 2020-01-13 HISTORY — PX: BEDSIDE EVACUATION OF HEMATOMA: SHX6535

## 2020-01-13 LAB — POCT I-STAT 7, (LYTES, BLD GAS, ICA,H+H)
Acid-base deficit: 2 mmol/L (ref 0.0–2.0)
Acid-base deficit: 2 mmol/L (ref 0.0–2.0)
Acid-base deficit: 3 mmol/L — ABNORMAL HIGH (ref 0.0–2.0)
Acid-base deficit: 3 mmol/L — ABNORMAL HIGH (ref 0.0–2.0)
Acid-base deficit: 3 mmol/L — ABNORMAL HIGH (ref 0.0–2.0)
Acid-base deficit: 4 mmol/L — ABNORMAL HIGH (ref 0.0–2.0)
Acid-base deficit: 5 mmol/L — ABNORMAL HIGH (ref 0.0–2.0)
Acid-base deficit: 5 mmol/L — ABNORMAL HIGH (ref 0.0–2.0)
Bicarbonate: 19.9 mmol/L — ABNORMAL LOW (ref 20.0–28.0)
Bicarbonate: 21.4 mmol/L (ref 20.0–28.0)
Bicarbonate: 21.6 mmol/L (ref 20.0–28.0)
Bicarbonate: 21.7 mmol/L (ref 20.0–28.0)
Bicarbonate: 22 mmol/L (ref 20.0–28.0)
Bicarbonate: 22.5 mmol/L (ref 20.0–28.0)
Bicarbonate: 22.5 mmol/L (ref 20.0–28.0)
Bicarbonate: 23.4 mmol/L (ref 20.0–28.0)
Calcium, Ion: 1.13 mmol/L — ABNORMAL LOW (ref 1.15–1.40)
Calcium, Ion: 1.18 mmol/L (ref 1.15–1.40)
Calcium, Ion: 1.21 mmol/L (ref 1.15–1.40)
Calcium, Ion: 1.23 mmol/L (ref 1.15–1.40)
Calcium, Ion: 1.25 mmol/L (ref 1.15–1.40)
Calcium, Ion: 1.25 mmol/L (ref 1.15–1.40)
Calcium, Ion: 1.31 mmol/L (ref 1.15–1.40)
Calcium, Ion: 1.43 mmol/L — ABNORMAL HIGH (ref 1.15–1.40)
HCT: 19 % — ABNORMAL LOW (ref 39.0–52.0)
HCT: 21 % — ABNORMAL LOW (ref 39.0–52.0)
HCT: 23 % — ABNORMAL LOW (ref 39.0–52.0)
HCT: 23 % — ABNORMAL LOW (ref 39.0–52.0)
HCT: 24 % — ABNORMAL LOW (ref 39.0–52.0)
HCT: 24 % — ABNORMAL LOW (ref 39.0–52.0)
HCT: 24 % — ABNORMAL LOW (ref 39.0–52.0)
HCT: 27 % — ABNORMAL LOW (ref 39.0–52.0)
Hemoglobin: 6.5 g/dL — CL (ref 13.0–17.0)
Hemoglobin: 7.1 g/dL — ABNORMAL LOW (ref 13.0–17.0)
Hemoglobin: 7.8 g/dL — ABNORMAL LOW (ref 13.0–17.0)
Hemoglobin: 7.8 g/dL — ABNORMAL LOW (ref 13.0–17.0)
Hemoglobin: 8.2 g/dL — ABNORMAL LOW (ref 13.0–17.0)
Hemoglobin: 8.2 g/dL — ABNORMAL LOW (ref 13.0–17.0)
Hemoglobin: 8.2 g/dL — ABNORMAL LOW (ref 13.0–17.0)
Hemoglobin: 9.2 g/dL — ABNORMAL LOW (ref 13.0–17.0)
O2 Saturation: 100 %
O2 Saturation: 100 %
O2 Saturation: 98 %
O2 Saturation: 98 %
O2 Saturation: 99 %
O2 Saturation: 99 %
O2 Saturation: 99 %
O2 Saturation: 99 %
Patient temperature: 37.3
Patient temperature: 37.7
Patient temperature: 38.2
Patient temperature: 38.6
Patient temperature: 38.9
Patient temperature: 39.1
Potassium: 4.1 mmol/L (ref 3.5–5.1)
Potassium: 4.1 mmol/L (ref 3.5–5.1)
Potassium: 4.1 mmol/L (ref 3.5–5.1)
Potassium: 4.2 mmol/L (ref 3.5–5.1)
Potassium: 4.3 mmol/L (ref 3.5–5.1)
Potassium: 4.4 mmol/L (ref 3.5–5.1)
Potassium: 4.4 mmol/L (ref 3.5–5.1)
Potassium: 4.5 mmol/L (ref 3.5–5.1)
Sodium: 135 mmol/L (ref 135–145)
Sodium: 135 mmol/L (ref 135–145)
Sodium: 135 mmol/L (ref 135–145)
Sodium: 135 mmol/L (ref 135–145)
Sodium: 136 mmol/L (ref 135–145)
Sodium: 136 mmol/L (ref 135–145)
Sodium: 136 mmol/L (ref 135–145)
Sodium: 137 mmol/L (ref 135–145)
TCO2: 21 mmol/L — ABNORMAL LOW (ref 22–32)
TCO2: 23 mmol/L (ref 22–32)
TCO2: 23 mmol/L (ref 22–32)
TCO2: 23 mmol/L (ref 22–32)
TCO2: 23 mmol/L (ref 22–32)
TCO2: 24 mmol/L (ref 22–32)
TCO2: 24 mmol/L (ref 22–32)
TCO2: 25 mmol/L (ref 22–32)
pCO2 arterial: 35.5 mmHg (ref 32.0–48.0)
pCO2 arterial: 36.8 mmHg (ref 32.0–48.0)
pCO2 arterial: 36.9 mmHg (ref 32.0–48.0)
pCO2 arterial: 38.5 mmHg (ref 32.0–48.0)
pCO2 arterial: 39.3 mmHg (ref 32.0–48.0)
pCO2 arterial: 42.1 mmHg (ref 32.0–48.0)
pCO2 arterial: 44.2 mmHg (ref 32.0–48.0)
pCO2 arterial: 46.2 mmHg (ref 32.0–48.0)
pH, Arterial: 7.296 — ABNORMAL LOW (ref 7.350–7.450)
pH, Arterial: 7.314 — ABNORMAL LOW (ref 7.350–7.450)
pH, Arterial: 7.342 — ABNORMAL LOW (ref 7.350–7.450)
pH, Arterial: 7.349 — ABNORMAL LOW (ref 7.350–7.450)
pH, Arterial: 7.351 (ref 7.350–7.450)
pH, Arterial: 7.382 (ref 7.350–7.450)
pH, Arterial: 7.385 (ref 7.350–7.450)
pH, Arterial: 7.399 (ref 7.350–7.450)
pO2, Arterial: 114 mmHg — ABNORMAL HIGH (ref 83.0–108.0)
pO2, Arterial: 125 mmHg — ABNORMAL HIGH (ref 83.0–108.0)
pO2, Arterial: 128 mmHg — ABNORMAL HIGH (ref 83.0–108.0)
pO2, Arterial: 146 mmHg — ABNORMAL HIGH (ref 83.0–108.0)
pO2, Arterial: 155 mmHg — ABNORMAL HIGH (ref 83.0–108.0)
pO2, Arterial: 178 mmHg — ABNORMAL HIGH (ref 83.0–108.0)
pO2, Arterial: 203 mmHg — ABNORMAL HIGH (ref 83.0–108.0)
pO2, Arterial: 204 mmHg — ABNORMAL HIGH (ref 83.0–108.0)

## 2020-01-13 LAB — LACTIC ACID, PLASMA
Lactic Acid, Venous: 3.4 mmol/L (ref 0.5–1.9)
Lactic Acid, Venous: 2.7 mmol/L (ref 0.5–1.9)
Lactic Acid, Venous: 1.7 mmol/L (ref 0.5–1.9)
Lactic Acid, Venous: 1.6 mmol/L (ref 0.5–1.9)

## 2020-01-13 LAB — BASIC METABOLIC PANEL
Anion gap: 12 (ref 5–15)
Anion gap: 12 (ref 5–15)
Anion gap: 13 (ref 5–15)
Anion gap: 10 (ref 5–15)
BUN: 24 mg/dL — ABNORMAL HIGH (ref 6–20)
BUN: 25 mg/dL — ABNORMAL HIGH (ref 6–20)
BUN: 26 mg/dL — ABNORMAL HIGH (ref 6–20)
BUN: 26 mg/dL — ABNORMAL HIGH (ref 6–20)
CO2: 20 mmol/L — ABNORMAL LOW (ref 22–32)
CO2: 19 mmol/L — ABNORMAL LOW (ref 22–32)
CO2: 20 mmol/L — ABNORMAL LOW (ref 22–32)
CO2: 22 mmol/L (ref 22–32)
Calcium: 8.7 mg/dL — ABNORMAL LOW (ref 8.9–10.3)
Calcium: 8.2 mg/dL — ABNORMAL LOW (ref 8.9–10.3)
Calcium: 8.3 mg/dL — ABNORMAL LOW (ref 8.9–10.3)
Calcium: 8.1 mg/dL — ABNORMAL LOW (ref 8.9–10.3)
Chloride: 102 mmol/L (ref 98–111)
Chloride: 102 mmol/L (ref 98–111)
Chloride: 100 mmol/L (ref 98–111)
Chloride: 103 mmol/L (ref 98–111)
Creatinine, Ser: 1.48 mg/dL — ABNORMAL HIGH (ref 0.61–1.24)
Creatinine, Ser: 1.62 mg/dL — ABNORMAL HIGH (ref 0.61–1.24)
Creatinine, Ser: 1.82 mg/dL — ABNORMAL HIGH (ref 0.61–1.24)
Creatinine, Ser: 1.79 mg/dL — ABNORMAL HIGH (ref 0.61–1.24)
GFR, Estimated: 57 mL/min — ABNORMAL LOW (ref >60)
GFR, Estimated: 51 mL/min — ABNORMAL LOW (ref >60)
GFR, Estimated: 44 mL/min — ABNORMAL LOW (ref >60)
GFR, Estimated: 45 mL/min — ABNORMAL LOW (ref >60)
Glucose, Bld: 146 mg/dL — ABNORMAL HIGH (ref 70–99)
Glucose, Bld: 126 mg/dL — ABNORMAL HIGH (ref 70–99)
Glucose, Bld: 156 mg/dL — ABNORMAL HIGH (ref 70–99)
Glucose, Bld: 122 mg/dL — ABNORMAL HIGH (ref 70–99)
Potassium: 4.2 mmol/L (ref 3.5–5.1)
Potassium: 4.2 mmol/L (ref 3.5–5.1)
Potassium: 4.2 mmol/L (ref 3.5–5.1)
Potassium: 4.2 mmol/L (ref 3.5–5.1)
Sodium: 134 mmol/L — ABNORMAL LOW (ref 135–145)
Sodium: 133 mmol/L — ABNORMAL LOW (ref 135–145)
Sodium: 133 mmol/L — ABNORMAL LOW (ref 135–145)
Sodium: 135 mmol/L (ref 135–145)

## 2020-01-13 LAB — GLUCOSE, CAPILLARY
Glucose-Capillary: 111 mg/dL — ABNORMAL HIGH (ref 70–99)
Glucose-Capillary: 118 mg/dL — ABNORMAL HIGH (ref 70–99)
Glucose-Capillary: 118 mg/dL — ABNORMAL HIGH (ref 70–99)
Glucose-Capillary: 119 mg/dL — ABNORMAL HIGH (ref 70–99)
Glucose-Capillary: 125 mg/dL — ABNORMAL HIGH (ref 70–99)
Glucose-Capillary: 126 mg/dL — ABNORMAL HIGH (ref 70–99)
Glucose-Capillary: 130 mg/dL — ABNORMAL HIGH (ref 70–99)
Glucose-Capillary: 131 mg/dL — ABNORMAL HIGH (ref 70–99)
Glucose-Capillary: 132 mg/dL — ABNORMAL HIGH (ref 70–99)
Glucose-Capillary: 136 mg/dL — ABNORMAL HIGH (ref 70–99)
Glucose-Capillary: 137 mg/dL — ABNORMAL HIGH (ref 70–99)
Glucose-Capillary: 137 mg/dL — ABNORMAL HIGH (ref 70–99)
Glucose-Capillary: 139 mg/dL — ABNORMAL HIGH (ref 70–99)
Glucose-Capillary: 145 mg/dL — ABNORMAL HIGH (ref 70–99)
Glucose-Capillary: 148 mg/dL — ABNORMAL HIGH (ref 70–99)
Glucose-Capillary: 158 mg/dL — ABNORMAL HIGH (ref 70–99)

## 2020-01-13 LAB — BPAM FFP
Blood Product Expiration Date: 202112192359
Blood Product Expiration Date: 202112192359
ISSUE DATE / TIME: 202112150940
ISSUE DATE / TIME: 202112150940
Unit Type and Rh: 600
Unit Type and Rh: 600

## 2020-01-13 LAB — CBC
HCT: 22.3 % — ABNORMAL LOW (ref 39.0–52.0)
HCT: 24.2 % — ABNORMAL LOW (ref 39.0–52.0)
HCT: 25.2 % — ABNORMAL LOW (ref 39.0–52.0)
HCT: 25.3 % — ABNORMAL LOW (ref 39.0–52.0)
HCT: 25.7 % — ABNORMAL LOW (ref 39.0–52.0)
Hemoglobin: 7.5 g/dL — ABNORMAL LOW (ref 13.0–17.0)
Hemoglobin: 8.4 g/dL — ABNORMAL LOW (ref 13.0–17.0)
Hemoglobin: 8.4 g/dL — ABNORMAL LOW (ref 13.0–17.0)
Hemoglobin: 8.5 g/dL — ABNORMAL LOW (ref 13.0–17.0)
Hemoglobin: 8.5 g/dL — ABNORMAL LOW (ref 13.0–17.0)
MCH: 28.5 pg (ref 26.0–34.0)
MCH: 28.6 pg (ref 26.0–34.0)
MCH: 28.7 pg (ref 26.0–34.0)
MCH: 28.9 pg (ref 26.0–34.0)
MCH: 29.4 pg (ref 26.0–34.0)
MCHC: 33.1 g/dL (ref 30.0–36.0)
MCHC: 33.3 g/dL (ref 30.0–36.0)
MCHC: 33.6 g/dL (ref 30.0–36.0)
MCHC: 33.6 g/dL (ref 30.0–36.0)
MCHC: 34.7 g/dL (ref 30.0–36.0)
MCV: 84.6 fL (ref 80.0–100.0)
MCV: 85.1 fL (ref 80.0–100.0)
MCV: 85.4 fL (ref 80.0–100.0)
MCV: 85.5 fL (ref 80.0–100.0)
MCV: 87.4 fL (ref 80.0–100.0)
Platelets: 136 10*3/uL — ABNORMAL LOW (ref 150–400)
Platelets: 146 10*3/uL — ABNORMAL LOW (ref 150–400)
Platelets: 155 10*3/uL (ref 150–400)
Platelets: 192 10*3/uL (ref 150–400)
Platelets: 201 10*3/uL (ref 150–400)
RBC: 2.62 MIL/uL — ABNORMAL LOW (ref 4.22–5.81)
RBC: 2.86 MIL/uL — ABNORMAL LOW (ref 4.22–5.81)
RBC: 2.94 MIL/uL — ABNORMAL LOW (ref 4.22–5.81)
RBC: 2.95 MIL/uL — ABNORMAL LOW (ref 4.22–5.81)
RBC: 2.96 MIL/uL — ABNORMAL LOW (ref 4.22–5.81)
RDW: 15.6 % — ABNORMAL HIGH (ref 11.5–15.5)
RDW: 15.8 % — ABNORMAL HIGH (ref 11.5–15.5)
RDW: 16 % — ABNORMAL HIGH (ref 11.5–15.5)
RDW: 16.2 % — ABNORMAL HIGH (ref 11.5–15.5)
RDW: 16.3 % — ABNORMAL HIGH (ref 11.5–15.5)
WBC: 15.4 10*3/uL — ABNORMAL HIGH (ref 4.0–10.5)
WBC: 16.7 10*3/uL — ABNORMAL HIGH (ref 4.0–10.5)
WBC: 16.8 10*3/uL — ABNORMAL HIGH (ref 4.0–10.5)
WBC: 17.4 10*3/uL — ABNORMAL HIGH (ref 4.0–10.5)
WBC: 21.5 10*3/uL — ABNORMAL HIGH (ref 4.0–10.5)
nRBC: 0 % (ref 0.0–0.2)
nRBC: 0 % (ref 0.0–0.2)
nRBC: 0 % (ref 0.0–0.2)
nRBC: 0 % (ref 0.0–0.2)
nRBC: 0 % (ref 0.0–0.2)

## 2020-01-13 LAB — PREPARE FRESH FROZEN PLASMA: Unit division: 0

## 2020-01-13 LAB — PREPARE RBC (CROSSMATCH)

## 2020-01-13 LAB — MAGNESIUM
Magnesium: 2.5 mg/dL — ABNORMAL HIGH (ref 1.7–2.4)
Magnesium: 2.3 mg/dL (ref 1.7–2.4)
Magnesium: 2.1 mg/dL (ref 1.7–2.4)

## 2020-01-13 LAB — POCT I-STAT, CHEM 8
BUN: 26 mg/dL — ABNORMAL HIGH (ref 6–20)
Calcium, Ion: 1.25 mmol/L (ref 1.15–1.40)
Chloride: 102 mmol/L (ref 98–111)
Creatinine, Ser: 1.8 mg/dL — ABNORMAL HIGH (ref 0.61–1.24)
Glucose, Bld: 138 mg/dL — ABNORMAL HIGH (ref 70–99)
HCT: 25 % — ABNORMAL LOW (ref 39.0–52.0)
Hemoglobin: 8.5 g/dL — ABNORMAL LOW (ref 13.0–17.0)
Potassium: 4.3 mmol/L (ref 3.5–5.1)
Sodium: 135 mmol/L (ref 135–145)
TCO2: 21 mmol/L — ABNORMAL LOW (ref 22–32)

## 2020-01-13 LAB — COOXEMETRY PANEL
Carboxyhemoglobin: 1.4 % (ref 0.5–1.5)
Carboxyhemoglobin: 1.3 % (ref 0.5–1.5)
Methemoglobin: 1.5 % (ref 0.0–1.5)
Methemoglobin: 1.6 % — ABNORMAL HIGH (ref 0.0–1.5)
O2 Saturation: 50.9 %
O2 Saturation: 77.8 %
Total hemoglobin: 7.2 g/dL — ABNORMAL LOW (ref 12.0–16.0)
Total hemoglobin: 8.4 g/dL — ABNORMAL LOW (ref 12.0–16.0)

## 2020-01-13 LAB — PROTIME-INR
INR: 1.9 — ABNORMAL HIGH (ref 0.8–1.2)
INR: 1.9 — ABNORMAL HIGH (ref 0.8–1.2)
Prothrombin Time: 20.8 seconds — ABNORMAL HIGH (ref 11.4–15.2)
Prothrombin Time: 21.5 seconds — ABNORMAL HIGH (ref 11.4–15.2)

## 2020-01-13 LAB — ECHO INTRAOPERATIVE TEE
Height: 75 in
Weight: 4310.43 oz

## 2020-01-13 LAB — HEPATIC FUNCTION PANEL
ALT: 85 U/L — ABNORMAL HIGH (ref 0–44)
AST: 173 U/L — ABNORMAL HIGH (ref 15–41)
Albumin: 2.4 g/dL — ABNORMAL LOW (ref 3.5–5.0)
Alkaline Phosphatase: 73 U/L (ref 38–126)
Bilirubin, Direct: 0.6 mg/dL — ABNORMAL HIGH (ref 0.0–0.2)
Indirect Bilirubin: 1 mg/dL — ABNORMAL HIGH (ref 0.3–0.9)
Total Bilirubin: 1.6 mg/dL — ABNORMAL HIGH (ref 0.3–1.2)
Total Protein: 4.3 g/dL — ABNORMAL LOW (ref 6.5–8.1)

## 2020-01-13 LAB — PROCALCITONIN: Procalcitonin: 15.15 ng/mL

## 2020-01-13 SURGERY — BEDSIDE EVACUATION OF HEMATOMA
Anesthesia: General

## 2020-01-13 SURGERY — BEDSIDE EVACUATION OF HEMATOMA
Anesthesia: Choice

## 2020-01-13 SURGERY — EXPLORATION POST OPERATIVE OPEN HEART
Anesthesia: General | Site: Chest

## 2020-01-13 MED ORDER — FUROSEMIDE 10 MG/ML IJ SOLN
INTRAMUSCULAR | Status: AC
  Filled 2020-01-13: qty 4

## 2020-01-13 MED ORDER — VANCOMYCIN HCL IN DEXTROSE 1-5 GM/200ML-% IV SOLN
1000.0000 mg | Freq: Two times a day (BID) | INTRAVENOUS | Status: DC
  Administered 2020-01-13 – 2020-01-14 (×4): 1000 mg via INTRAVENOUS
  Filled 2020-01-13 (×5): qty 200

## 2020-01-13 MED ORDER — ORAL CARE MOUTH RINSE
15.0000 mL | OROMUCOSAL | Status: DC
  Administered 2020-01-13 – 2020-03-08 (×520): 15 mL via OROMUCOSAL

## 2020-01-13 MED ORDER — POTASSIUM CHLORIDE 2 MEQ/ML IV SOLN
80.0000 meq | INTRAVENOUS | Status: DC
  Filled 2020-01-13: qty 40

## 2020-01-13 MED ORDER — SODIUM CHLORIDE 0.9% IV SOLUTION: Freq: Once | INTRAVENOUS | Status: AC

## 2020-01-13 MED ORDER — CALCIUM CHLORIDE 10 % IV SOLN
1.0000 g | Freq: Once | INTRAVENOUS | Status: AC
  Administered 2020-01-13: 10:00:00 1 g via INTRAVENOUS

## 2020-01-13 MED ORDER — HEMOSTATIC AGENTS (NO CHARGE) OPTIME
TOPICAL | Status: DC | PRN
Start: 2020-01-13 — End: 2020-01-13
  Administered 2020-01-13 (×2): 1 via TOPICAL

## 2020-01-13 MED ORDER — NITROGLYCERIN IN D5W 200-5 MCG/ML-% IV SOLN
2.0000 ug/min | INTRAVENOUS | Status: DC
  Filled 2020-01-13: qty 250

## 2020-01-13 MED ORDER — TRANEXAMIC ACID (OHS) BOLUS VIA INFUSION
15.0000 mg/kg | INTRAVENOUS | Status: AC
  Administered 2020-01-13: 15:00:00 1833 mg via INTRAVENOUS
  Filled 2020-01-13: qty 1833

## 2020-01-13 MED ORDER — SODIUM CHLORIDE 0.9% FLUSH
10.0000 mL | Freq: Two times a day (BID) | INTRAVENOUS | Status: DC
  Administered 2020-01-13: 10:00:00 10 mL
  Administered 2020-01-13: 21:00:00 20 mL
  Administered 2020-01-13 – 2020-01-16 (×5): 10 mL
  Administered 2020-01-16: 12:00:00 30 mL
  Administered 2020-01-17 – 2020-01-20 (×3): 10 mL
  Administered 2020-01-21: 12:00:00 30 mL
  Administered 2020-01-21 – 2020-01-22 (×2): 10 mL
  Administered 2020-01-22: 22:00:00 20 mL
  Administered 2020-01-23 – 2020-01-25 (×5): 10 mL
  Administered 2020-01-26: 10:00:00 20 mL
  Administered 2020-01-26 – 2020-01-27 (×2): 10 mL
  Administered 2020-01-27 – 2020-01-28 (×2): 20 mL
  Administered 2020-01-28 – 2020-02-06 (×15): 10 mL
  Administered 2020-02-07: 20 mL
  Administered 2020-02-07 – 2020-02-18 (×19): 10 mL
  Administered 2020-02-18: 20 mL
  Administered 2020-02-19 (×2): 10 mL
  Administered 2020-02-20: 20 mL
  Administered 2020-02-20 – 2020-03-07 (×22): 10 mL
  Administered 2020-03-07: 20 mL

## 2020-01-13 MED ORDER — VANCOMYCIN HCL 1000 MG IV SOLR
INTRAVENOUS | Status: AC
  Filled 2020-01-13: qty 3000

## 2020-01-13 MED ORDER — MAGNESIUM SULFATE 50 % IJ SOLN
40.0000 meq | INTRAMUSCULAR | Status: DC
  Filled 2020-01-13: qty 9.85

## 2020-01-13 MED ORDER — MILRINONE LACTATE IN DEXTROSE 20-5 MG/100ML-% IV SOLN
0.3000 ug/kg/min | INTRAVENOUS | Status: DC
  Filled 2020-01-13: qty 100

## 2020-01-13 MED ORDER — 0.9 % SODIUM CHLORIDE (POUR BTL) OPTIME
TOPICAL | Status: DC | PRN
  Administered 2020-01-13: 14:00:00 5000 mL

## 2020-01-13 MED ORDER — ATORVASTATIN CALCIUM 80 MG PO TABS
80.0000 mg | ORAL_TABLET | Freq: Every day | ORAL | Status: DC
  Administered 2020-01-14 – 2020-03-07 (×54): 80 mg
  Filled 2020-01-13 (×54): qty 1

## 2020-01-13 MED ORDER — PLASMA-LYTE 148 IV SOLN: INTRAVENOUS | Status: DC | PRN

## 2020-01-13 MED ORDER — INSULIN REGULAR(HUMAN) IN NACL 100-0.9 UT/100ML-% IV SOLN: INTRAVENOUS | Status: DC

## 2020-01-13 MED ORDER — FUROSEMIDE 10 MG/ML IJ SOLN
20.0000 mg/h | INTRAVENOUS | Status: DC
  Administered 2020-01-13: 19:00:00 10 mg/h via INTRAVENOUS
  Administered 2020-01-14 – 2020-01-15 (×3): 12 mg/h via INTRAVENOUS
  Administered 2020-01-16 – 2020-01-17 (×2): 20 mg/h via INTRAVENOUS
  Filled 2020-01-13 (×9): qty 20

## 2020-01-13 MED ORDER — DEXMEDETOMIDINE HCL IN NACL 400 MCG/100ML IV SOLN
0.1000 ug/kg/h | INTRAVENOUS | Status: DC
  Filled 2020-01-13: qty 100

## 2020-01-13 MED ORDER — HEMOSTATIC AGENTS (NO CHARGE) OPTIME
TOPICAL | Status: DC | PRN
Start: 2020-01-13 — End: 2020-01-13
  Administered 2020-01-13: 1 via TOPICAL

## 2020-01-13 MED ORDER — TRANEXAMIC ACID 1000 MG/10ML IV SOLN
1.5000 mg/kg/h | INTRAVENOUS | Status: AC
  Administered 2020-01-13: 16:00:00 1.5 mg/kg/h via INTRAVENOUS
  Filled 2020-01-13: qty 25

## 2020-01-13 MED ORDER — MILRINONE LACTATE IN DEXTROSE 20-5 MG/100ML-% IV SOLN
0.2500 ug/kg/min | INTRAVENOUS | Status: DC
  Administered 2020-01-13 (×3): 0.25 ug/kg/min via INTRAVENOUS

## 2020-01-13 MED ORDER — ALBUMIN HUMAN 5 % IV SOLN
12.5000 g | Freq: Once | INTRAVENOUS | Status: AC
  Administered 2020-01-13: 12:00:00 12.5 g via INTRAVENOUS

## 2020-01-13 MED ORDER — ALBUMIN HUMAN 5 % IV SOLN
12.5000 g | Freq: Once | INTRAVENOUS | Status: AC
  Administered 2020-01-13: 10:00:00 12.5 g via INTRAVENOUS

## 2020-01-13 MED ORDER — VANCOMYCIN HCL 1500 MG/300ML IV SOLN
1500.0000 mg | INTRAVENOUS | Status: DC
  Filled 2020-01-13: qty 300

## 2020-01-13 MED ORDER — SODIUM CHLORIDE 0.9 % IV SOLN
1.5000 g | INTRAVENOUS | Status: DC
  Filled 2020-01-13: qty 1.5

## 2020-01-13 MED ORDER — SODIUM CHLORIDE 0.9 % IV SOLN
INTRAVENOUS | Status: DC
  Filled 2020-01-13: qty 30

## 2020-01-13 MED ORDER — VALPROIC ACID 250 MG/5ML PO SOLN
250.0000 mg | Freq: Two times a day (BID) | ORAL | Status: DC
  Administered 2020-01-13 – 2020-03-05 (×103): 250 mg
  Filled 2020-01-13 (×105): qty 5

## 2020-01-13 MED ORDER — FLUOXETINE HCL 20 MG PO CAPS
40.0000 mg | ORAL_CAPSULE | Freq: Two times a day (BID) | ORAL | Status: DC
  Administered 2020-01-13 – 2020-01-14 (×2): 40 mg
  Filled 2020-01-13 (×2): qty 2

## 2020-01-13 MED ORDER — PLASMA-LYTE 148 IV SOLN
INTRAVENOUS | Status: DC
  Filled 2020-01-13 (×2): qty 2.5

## 2020-01-13 MED ORDER — LEVOFLOXACIN IN D5W 500 MG/100ML IV SOLN
500.0000 mg | INTRAVENOUS | Status: DC
  Filled 2020-01-13: qty 100

## 2020-01-13 MED ORDER — TRANEXAMIC ACID (OHS) PUMP PRIME SOLUTION
2.0000 mg/kg | INTRAVENOUS | Status: DC
  Administered 2020-01-13: 16:00:00 244 mg
  Filled 2020-01-13: qty 2.44

## 2020-01-13 MED ORDER — AMIODARONE LOAD VIA INFUSION
150.0000 mg | Freq: Once | INTRAVENOUS | Status: AC
  Administered 2020-01-13: 02:00:00 150 mg via INTRAVENOUS
  Filled 2020-01-13: qty 83.34

## 2020-01-13 MED ORDER — FUROSEMIDE 10 MG/ML IJ SOLN
40.0000 mg | Freq: Once | INTRAMUSCULAR | Status: AC
  Administered 2020-01-13: 13:00:00 40 mg via INTRAVENOUS
  Filled 2020-01-13: qty 4

## 2020-01-13 MED ORDER — PANTOPRAZOLE SODIUM 40 MG PO PACK
40.0000 mg | PACK | Freq: Every day | ORAL | Status: DC
  Administered 2020-01-14 – 2020-02-08 (×26): 40 mg
  Filled 2020-01-13 (×26): qty 20

## 2020-01-13 MED ORDER — SODIUM CHLORIDE 0.9% FLUSH
10.0000 mL | INTRAVENOUS | Status: DC | PRN
Start: 2020-01-13 — End: 2020-03-08

## 2020-01-13 MED ORDER — FENTANYL CITRATE (PF) 100 MCG/2ML IJ SOLN
INTRAMUSCULAR | Status: DC | PRN
  Administered 2020-01-13: 100 ug via INTRAVENOUS

## 2020-01-13 MED ORDER — 0.9 % SODIUM CHLORIDE (POUR BTL) OPTIME
TOPICAL | Status: DC | PRN
  Administered 2020-01-13: 08:00:00 1000 mL

## 2020-01-13 MED ORDER — SODIUM CHLORIDE 0.9 % IV SOLN
750.0000 mg | INTRAVENOUS | Status: DC
  Filled 2020-01-13: qty 750

## 2020-01-13 MED ORDER — SODIUM CHLORIDE 0.9 % IV SOLN
1.0000 g | Freq: Three times a day (TID) | INTRAVENOUS | Status: DC
  Administered 2020-01-13 – 2020-01-20 (×21): 1 g via INTRAVENOUS
  Filled 2020-01-13 (×25): qty 1

## 2020-01-13 MED ORDER — VECURONIUM BROMIDE 10 MG IV SOLR
INTRAVENOUS | Status: AC
  Administered 2020-01-13: 08:00:00 10 mg
  Filled 2020-01-13: qty 10

## 2020-01-13 MED ORDER — CHLORHEXIDINE GLUCONATE 0.12% ORAL RINSE (MEDLINE KIT)
15.0000 mL | Freq: Two times a day (BID) | OROMUCOSAL | Status: DC
  Administered 2020-01-13 – 2020-03-07 (×102): 15 mL via OROMUCOSAL

## 2020-01-13 MED ORDER — NOREPINEPHRINE 4 MG/250ML-% IV SOLN
0.0000 ug/min | INTRAVENOUS | Status: AC
  Administered 2020-01-13: 15:00:00 50 ug/min via INTRAVENOUS
  Filled 2020-01-13: qty 250

## 2020-01-13 MED ORDER — FENTANYL CITRATE (PF) 250 MCG/5ML IJ SOLN
INTRAMUSCULAR | Status: AC
  Filled 2020-01-13: qty 5

## 2020-01-13 MED ORDER — BUSPIRONE HCL 10 MG PO TABS
10.0000 mg | ORAL_TABLET | Freq: Three times a day (TID) | ORAL | Status: DC
  Administered 2020-01-13 – 2020-03-07 (×158): 10 mg
  Filled 2020-01-13 (×156): qty 1

## 2020-01-13 MED ORDER — VANCOMYCIN HCL 1000 MG IV SOLR
INTRAVENOUS | Status: DC | PRN
  Administered 2020-01-13: 3000 mg via TOPICAL

## 2020-01-13 MED ORDER — AMIODARONE IV BOLUS ONLY 150 MG/100ML: 150.0000 mg | Freq: Once | INTRAVENOUS | Status: DC

## 2020-01-13 MED ORDER — PROPOFOL 10 MG/ML IV BOLUS
INTRAVENOUS | Status: AC
  Filled 2020-01-13: qty 20

## 2020-01-13 MED ORDER — HEPARIN SODIUM (PORCINE) 5000 UNIT/ML IJ SOLN
25000.0000 [IU] | INTRAVENOUS | Status: DC
  Filled 2020-01-13 (×2): qty 5

## 2020-01-13 MED ORDER — SODIUM CHLORIDE 0.9 % IV SOLN
20.0000 ug | INTRAVENOUS | Status: AC
  Administered 2020-01-13: 12:00:00 20 ug via INTRAVENOUS
  Filled 2020-01-13: qty 5

## 2020-01-13 MED ORDER — MILRINONE LACTATE IN DEXTROSE 20-5 MG/100ML-% IV SOLN
0.1250 ug/kg/min | INTRAVENOUS | Status: DC
  Administered 2020-01-14 – 2020-01-20 (×8): 0.125 ug/kg/min via INTRAVENOUS
  Administered 2020-01-22 – 2020-01-25 (×8): 0.25 ug/kg/min via INTRAVENOUS
  Administered 2020-01-26 – 2020-01-30 (×6): 0.125 ug/kg/min via INTRAVENOUS
  Filled 2020-01-13 (×18): qty 100
  Filled 2020-01-13: qty 200
  Filled 2020-01-13 (×7): qty 100

## 2020-01-13 MED ORDER — EPINEPHRINE HCL 5 MG/250ML IV SOLN IN NS
0.0000 ug/min | INTRAVENOUS | Status: AC
  Administered 2020-01-13: 10 ug/min via INTRAVENOUS
  Filled 2020-01-13: qty 250

## 2020-01-13 MED ORDER — PHENYLEPHRINE HCL-NACL 20-0.9 MG/250ML-% IV SOLN
30.0000 ug/min | INTRAVENOUS | Status: DC
  Filled 2020-01-13: qty 250

## 2020-01-13 MED ORDER — AMIODARONE LOAD VIA INFUSION
150.0000 mg | Freq: Once | INTRAVENOUS | Status: AC
  Administered 2020-01-13: 12:00:00 150 mg via INTRAVENOUS
  Filled 2020-01-13: qty 83.34

## 2020-01-13 MED ORDER — HEPARIN SODIUM (PORCINE) 5000 UNIT/ML IJ SOLN
INTRAVENOUS | Status: DC
  Filled 2020-01-13: qty 10

## 2020-01-13 MED ORDER — CHLORHEXIDINE GLUCONATE 0.12 % MT SOLN
OROMUCOSAL | Status: AC
  Administered 2020-01-13: 22:00:00 15 mL via OROMUCOSAL
  Filled 2020-01-13: qty 15

## 2020-01-13 SURGICAL SUPPLY — 60 items
BAG DECANTER FOR FLEXI CONT (MISCELLANEOUS) ×2 IMPLANT
BANDAGE ESMARK 6X9 LF (GAUZE/BANDAGES/DRESSINGS) IMPLANT
BENZOIN TINCTURE PRP APPL 2/3 (GAUZE/BANDAGES/DRESSINGS) ×2 IMPLANT
BNDG ELASTIC 4X5.8 VLCR STR LF (GAUZE/BANDAGES/DRESSINGS) ×2 IMPLANT
BNDG ELASTIC 6X5.8 VLCR STR LF (GAUZE/BANDAGES/DRESSINGS) ×2 IMPLANT
BNDG ESMARK 6X9 LF (GAUZE/BANDAGES/DRESSINGS) ×3
CANISTER SUCT 3000ML PPV (MISCELLANEOUS) ×3 IMPLANT
CATH CPB KIT HENDRICKSON (MISCELLANEOUS) ×2 IMPLANT
CONN ST 1/4X3/8  BEN (MISCELLANEOUS) ×9
CONN ST 1/4X3/8 BEN (MISCELLANEOUS) IMPLANT
DERMABOND ADVANCED (GAUZE/BANDAGES/DRESSINGS)
DERMABOND ADVANCED .7 DNX12 (GAUZE/BANDAGES/DRESSINGS) ×2 IMPLANT
DRAIN CHANNEL 28F RND 3/8 FF (WOUND CARE) ×6 IMPLANT
DRAIN CHANNEL 32F RND 10.7 FF (WOUND CARE) ×4 IMPLANT
DRAPE CARDIOVASCULAR INCISE (DRAPES) ×3
DRAPE INCISE IOBAN 66X45 STRL (DRAPES) ×1 IMPLANT
DRAPE SLUSH/WARMER DISC (DRAPES) ×3 IMPLANT
DRAPE SRG 135X102X78XABS (DRAPES) ×2 IMPLANT
ELECT BLADE 4.0 EZ CLEAN MEGAD (MISCELLANEOUS) ×3
ELECT CAUTERY BLADE 6.4 (BLADE) ×3 IMPLANT
ELECT REM PT RETURN 9FT ADLT (ELECTROSURGICAL) ×6
ELECTRODE BLDE 4.0 EZ CLN MEGD (MISCELLANEOUS) IMPLANT
ELECTRODE REM PT RTRN 9FT ADLT (ELECTROSURGICAL) ×4 IMPLANT
FELT TEFLON 1X6 (MISCELLANEOUS) ×5 IMPLANT
GAUZE SPONGE 4X4 12PLY STRL (GAUZE/BANDAGES/DRESSINGS) ×5 IMPLANT
GAUZE SPONGE 4X4 12PLY STRL LF (GAUZE/BANDAGES/DRESSINGS) ×1 IMPLANT
GLOVE NEODERM STRL 7.5  LF PF (GLOVE) ×4
GLOVE NEODERM STRL 7.5 LF PF (GLOVE) ×6 IMPLANT
GLOVE SURG NEODERM 7.5  LF PF (GLOVE) ×2
GOWN STRL REUS W/ TWL LRG LVL3 (GOWN DISPOSABLE) ×8 IMPLANT
GOWN STRL REUS W/TWL LRG LVL3 (GOWN DISPOSABLE) ×12
HEMOSTAT POWDER SURGIFOAM 1G (HEMOSTASIS) ×6 IMPLANT
HEMOSTAT SURGICEL 2X14 (HEMOSTASIS) ×2 IMPLANT
HEMOSTAT SURGICEL 2X4 FIBR (HEMOSTASIS) ×1 IMPLANT
INSERT SUTURE HOLDER (MISCELLANEOUS) ×3 IMPLANT
KIT BASIN OR (CUSTOM PROCEDURE TRAY) ×3 IMPLANT
KIT SUCTION CATH 14FR (SUCTIONS) ×2 IMPLANT
KIT TURNOVER KIT B (KITS) ×3 IMPLANT
NS IRRIG 1000ML POUR BTL (IV SOLUTION) ×15 IMPLANT
PACK E OPEN HEART (SUTURE) ×3 IMPLANT
PACK OPEN HEART (CUSTOM PROCEDURE TRAY) ×3 IMPLANT
PACK UNIVERSAL I (CUSTOM PROCEDURE TRAY) ×1 IMPLANT
PAD ARMBOARD 7.5X6 YLW CONV (MISCELLANEOUS) ×6 IMPLANT
PAD ELECT DEFIB RADIOL ZOLL (MISCELLANEOUS) ×3 IMPLANT
POSITIONER HEAD DONUT 9IN (MISCELLANEOUS) ×3 IMPLANT
POWDER SURGICEL 3.0 GRAM (HEMOSTASIS) ×1 IMPLANT
SUT ETHIBOND X763 2 0 SH 1 (SUTURE) ×2 IMPLANT
SUT PROLENE 2 0 MH 48 (SUTURE) ×2 IMPLANT
SUT PROLENE 3 0 SH DA (SUTURE) ×4 IMPLANT
SUT PROLENE 4 0 RB 1 (SUTURE) ×3
SUT PROLENE 4 0 SH DA (SUTURE) ×2 IMPLANT
SUT PROLENE 4-0 RB1 .5 CRCL 36 (SUTURE) IMPLANT
SUT SILK 2 0 SH CR/8 (SUTURE) ×1 IMPLANT
SYSTEM SAHARA CHEST DRAIN ATS (WOUND CARE) ×4 IMPLANT
TAPE CLOTH SURG 4X10 WHT LF (GAUZE/BANDAGES/DRESSINGS) ×1 IMPLANT
TOWEL GREEN STERILE (TOWEL DISPOSABLE) ×3 IMPLANT
TOWEL GREEN STERILE FF (TOWEL DISPOSABLE) ×3 IMPLANT
UNDERPAD 30X36 HEAVY ABSORB (UNDERPADS AND DIAPERS) ×2 IMPLANT
WATER STERILE IRR 1000ML POUR (IV SOLUTION) ×6 IMPLANT
WATER STERILE IRR 1000ML UROMA (IV SOLUTION) IMPLANT

## 2020-01-13 SURGICAL SUPPLY — 13 items
COVER BACK TABLE 60X90IN (DRAPES) ×3 IMPLANT
DRAPE CHEST BREAST 15X10 FENES (DRAPES) ×3 IMPLANT
DRAPE INCISE IOBAN 85X60 (DRAPES) ×3 IMPLANT
GAUZE SPONGE 4X4 12PLY STRL (GAUZE/BANDAGES/DRESSINGS) ×3 IMPLANT
GLOVE BIO SURGEON STRL SZ 6.5 (GLOVE) ×4 IMPLANT
GLOVE BIO SURGEON STRL SZ7.5 (GLOVE) ×3 IMPLANT
GLOVE BIO SURGEONS STRL SZ 6.5 (GLOVE) ×2
GOWN STRL REUS W/ TWL LRG LVL3 (GOWN DISPOSABLE) ×1 IMPLANT
GOWN STRL REUS W/TWL LRG LVL3 (GOWN DISPOSABLE) ×3
SUT PROLENE 3 0 SH DA (SUTURE) ×3 IMPLANT
TUBE CONNECTING 20'X1/4 (TUBING) ×1
TUBE CONNECTING 20X1/4 (TUBING) ×2 IMPLANT
YANKAUER SUCT BULB TIP NO VENT (SUCTIONS) ×3 IMPLANT

## 2020-01-13 SURGICAL SUPPLY — 19 items
BANDAGE ESMARK 6X9 LF (GAUZE/BANDAGES/DRESSINGS) ×1 IMPLANT
BNDG ESMARK 6X9 LF (GAUZE/BANDAGES/DRESSINGS) ×2
DRAPE CHEST BREAST 15X10 FENES (DRAPES) ×2 IMPLANT
DRAPE INCISE IOBAN 85X60 (DRAPES) ×2 IMPLANT
DRAPE TABLE BACK 80X90 (DRAPES) ×2 IMPLANT
GAUZE XEROFORM 1X8 LF (GAUZE/BANDAGES/DRESSINGS) ×4 IMPLANT
GLOVE BIO SURGEON STRL SZ 6.5 (GLOVE) ×2 IMPLANT
GLOVE SS BIOGEL STRL SZ 7.5 (GLOVE) ×2 IMPLANT
GLOVE SUPERSENSE BIOGEL SZ 7.5 (GLOVE) ×2
GOWN STRL REUS W/ TWL XL LVL3 (GOWN DISPOSABLE) ×3 IMPLANT
GOWN STRL REUS W/TWL XL LVL3 (GOWN DISPOSABLE) ×6
POWDER SURGICEL 3.0 GRAM (HEMOSTASIS) ×2 IMPLANT
SPONGE LAP 18X18 RF (DISPOSABLE) ×2 IMPLANT
SUT PROLENE 2 0 MH 48 (SUTURE) ×8 IMPLANT
SUT PROLENE 5 0 C 1 36 (SUTURE) ×4 IMPLANT
SYR BULB IRRIG 60ML STRL (SYRINGE) ×2 IMPLANT
SYR TB 1ML LUER SLIP (SYRINGE) ×2 IMPLANT
TUBE CONNECTING 12X1/4 (SUCTIONS) ×2 IMPLANT
YANKAUER SUCT BULB TIP NO VENT (SUCTIONS) ×2 IMPLANT

## 2020-01-13 NOTE — Anesthesia Preprocedure Evaluation (Signed)
Anesthesia Evaluation  Patient identified by MRN, date of birth, ID band Patient awake    Reviewed: Allergy & Precautions, NPO status , Patient's Chart, lab work & pertinent test results, Unable to perform ROS - Chart review onlyPreop documentation limited or incomplete due to emergent nature of procedure.  Airway Mallampati: Intubated  TM Distance: >3 FB Neck ROM: Full    Dental   Pulmonary sleep apnea ,    Pulmonary exam normal        Cardiovascular hypertension, Pt. on medications + CAD and +CHF  + dysrhythmias Atrial Fibrillation + pacemaker + Cardiac Defibrillator  Rhythm:Regular Rate:Tachycardia  Findings: Please see echo section for full report.  Moderately dilated LV with normal wall thickness.  EF 15-20%, diffuse hypokinesis.  Impella catheter present in LV.  Mildly dilated RV with severe systolic dysfunction.  Moderate TR.  Pacing wires noted in right heart.  Moderate left atrial enlargement, no LA appendage thrombus.  Mild right atrial enlargement.  Trileaflet aortic valve, unable to comment on regurgitation or stenosis due to presence of Impella.  Mild mitral regurgitation.    Impella position adjusted under echo guidance.   Loralie Champagne 01/21/2020   Neuro/Psych PSYCHIATRIC DISORDERS Anxiety Depression Bipolar Disorder negative neurological ROS     GI/Hepatic Neg liver ROS, PUD, GERD  Medicated and Controlled,  Endo/Other  diabetes, Insulin Dependent  Renal/GU negative Renal ROS     Musculoskeletal  (+) Arthritis , Gout   Abdominal   Peds  Hematology  (+) anemia , HLD   Anesthesia Other Findings CAD  CHF  Reproductive/Obstetrics                             Anesthesia Physical  Anesthesia Plan  ASA: IV and emergent  Anesthesia Plan: General   Post-op Pain Management:    Induction: Intravenous  PONV Risk Score and Plan: 2 and Treatment may vary due to age or medical  condition  Airway Management Planned: Oral ETT  Additional Equipment: TEE and 3D TEE  Intra-op Plan:   Post-operative Plan: Post-operative intubation/ventilation  Informed Consent:   Plan Discussed with: CRNA and Anesthesiologist  Anesthesia Plan Comments:         Anesthesia Quick Evaluation

## 2020-01-13 NOTE — H&P (Signed)
History and Physical Interval Note:  01/18/2020 2:01 PM  [name]  has presented today for surgery, with the diagnosis of cardiac failure.  The various methods of treatment have been discussed with the patient and family. After consideration of risks, benefits and other options for treatment, the patient has consented to  PROCEDURE: Mediastinal exploration as a surgical intervention.  [name] history has been reviewed, patient examined, no change in status, stable for surgery.  I have reviewed the patient's chart and labs.  Questions were answered to the patient's satisfaction.     Corey Palmer

## 2020-01-13 NOTE — Progress Notes (Signed)
Unable to flow at P8 without suction event. Level dropped to p7 and maps are 58-60. Dr Lynetta Mare informed.

## 2020-01-13 NOTE — Progress Notes (Signed)
Initial Nutrition Assessment  DOCUMENTATION CODES:   Not applicable  INTERVENTION:   Recommend initiation of TF if unable to extubate within 24-48 hours  Tube Feeding Recommendations:  Vital 1.5 with goal rate of 55 ml/hr Plan to begin at 20 ml/hr Pro-Source 90 mL TID Provides 155 g of protein, 2220 kcals, and 1003 mL of free water Meets 100% estimated calorie and protein needs   NUTRITION DIAGNOSIS:   Inadequate oral intake related to acute illness as evidenced by NPO status.   GOAL:   Patient will meet greater than or equal to 90% of their needs   MONITOR:   Weight trends,Vent status  REASON FOR ASSESSMENT:   Ventilator    ASSESSMENT:   49 yo male admitted with Afib with RVR requiring cardioversion, ischemic CM requiring inotropes and impella support and ultimately underwent CABG x 4 on 12/15 with Impella still in place. PMH includes CAD, benign stomach cancer, CHF, DM, depression, CM, GERD, HTN   12/02 Admitted 12/08 Transferred to ICU 12/11 Impella LVAD placed 12/15 Redo Sternotomy, CABG on pump x 4, Intubated 12/16 Bedside chest exploration for tamponade with moderate amount of anterior mediastinal clot removed  Patient is currently intubated on ventilator support, sedated with fentanyl and versed. Requiring epinephrine, vasopressin and levophed.  MV: 11.6 L/min Temp (24hrs), Avg:100.8 F (38.2 C), Min:99.14 F (37.3 C), Max:102.56 F (39.2 C)  Propofol: NONE  Weight up significantly postop; current wt 122.2 kg; weight preop, 107 kg; lowest weight or 102 kg on 12/11.   Recorded po intake 50-100% of meals, mostly 100%, prior to procedure on 12/15  Chest tubes x 4 with 840 mL  Labs: reviewed Meds: insulin gtt, milrine gtt, amiodarone    Diet Order:   Diet Order    None      EDUCATION NEEDS:   Not appropriate for education at this time  Skin:  Skin Assessment: Skin Integrity Issues: Skin Integrity Issues:: Incisions,Other  (Comment) Incisions: stermun, chest tubes x 4 Other: venous stasis ulcers  Last BM:  12/15  Height:   Ht Readings from Last 1 Encounters:  01/14/2020 6\' 3"  (1.905 m)    Weight:   Wt Readings from Last 1 Encounters:  01/01/2020 122.2 kg    BMI:  Body mass index is 33.67 kg/m.  Estimated Nutritional Needs:   Kcal:  2100-2300 kcals  Protein:  135-170 g  Fluid:  >/= 1.5 L    Kerman Passey MS, RDN, LDN, CNSC Registered Dietitian III Clinical Nutrition RD Pager and On-Call Pager Number Located in [place]

## 2020-01-13 NOTE — H&P (Signed)
History and Physical Interval Note:  01/15/2020 8:26 AM  Corey Palmer  has presented today for surgery, with the diagnosis of cardiac failure.  The various methods of treatment have been discussed with the patient and family. After consideration of risks, benefits and other options for treatment, the patient has consented to  Procedure(s): CHEST EXPLORATION @2H12  (N/A) as a surgical intervention.  [name] history has been reviewed, patient examined, no change in status, stable for surgery.  I have reviewed the patient's chart and labs.  Questions were answered to the patient's satisfaction.     [name]

## 2020-01-13 NOTE — Consult Note (Signed)
ECMO Consult Note   Called to 2H12 for ECMO Consult at (time) 7:30AM by Dr. Aundra Dubin MD Admitting Diagnosis- heart failure Primary Issue- cardiogenic shock Age:49 y.o. Weight: 101 kg dry weight, 122kg current weight  Days on Mechanical Ventilation- 1  MAP FiO2 Oxygen Index P/F Ratio  64 0.6 4.5 213    Vasopressors yes   MSOF No   RESP score (VV-ECMO) : http://www.respscore.com  SAVE score (VA-ECMO) : -1 (42% predicted in hospital survival) http://www.save-score.com  Recent Blood Gas:     Component Value Date/Time   PHART 7.349 (L) 01/14/2020 0533   PCO2ART 36.9 12/31/2019 0533   PO2ART 128 (H) 01/22/2020 0533   HCO3 19.9 (L) 01/01/2020 0533   TCO2 21 (L) 01/19/2020 0533   ACIDBASEDEF 5.0 (H) 01/18/2020 0533   O2SAT 98.0 01/02/2020 0533    Coags:    Component Value Date/Time   INR 1.9 (H) 01/17/2020 0340   INR 3.3 12/18/2009 1059    CBC    Component Value Date/Time   WBC 15.4 (H) 01/23/2020 0340   RBC 2.62 (L) 01/27/2020 0340   HGB 7.8 (L) 12/31/2019 0533   HGB 16.2 03/23/2018 1535   HCT 23.0 (L) 12/30/2019 0533   HCT 47.0 03/23/2018 1535   PLT 192 01/21/2020 0340   PLT 251 03/23/2018 1535   MCV 85.1 01/07/2020 0340   MCV 82 03/23/2018 1535   MCH 28.6 01/01/2020 0340   MCHC 33.6 01/06/2020 0340   RDW 16.2 (H) 01/02/2020 0340   RDW 15.0 03/23/2018 1535   LYMPHSABS 2.5 03/23/2018 1535   MONOABS 0.4 08/08/2017 0501   EOSABS 0.2 03/23/2018 1535   BASOSABS 0.0 03/23/2018 1535    BMET    Component Value Date/Time   NA 136 01/19/2020 0533   NA 138 09/08/2018 0924   K 4.5 01/15/2020 0533   CL 102 01/06/2020 0340   CO2 19 (L) 01/22/2020 0340   GLUCOSE 126 (H) 01/21/2020 0340   BUN 25 (H) 01/26/2020 0340   BUN 23 09/08/2018 0924   CREATININE 1.62 (H) 01/21/2020 0340   CREATININE 1.13 05/13/2013 1533   CALCIUM 8.2 (L) 01/14/2020 0340   GFRNONAA 51 (L) 01/26/2020 0340   GFRNONAA 79 05/13/2013 1533   GFRAA >60 03/08/2019 1515   GFRAA >89 05/13/2013  1533                                                                                                                                                             ECMO physician Dr. Tamala Julian notified at 07:30 (time) Candidate meets ECMO Criteria- Yes  Placed on ECMO watch at 07:30 (time).  Additional notes-  Chest to be opened this AM, if persistent profound shock, likely move toward VA cannulation with goal to bridge through RV stunning  Reason for Consult:  VA ECMO Referring Physician: Dr. [name] is an 49 y.o. male.  HPI:   49 year man who presented with chest pain on 01/26/2020 found to be in [place] requiring cardioversion.  Hospital course complicated by persistent cardiogenic shock from ischemic cardiomyopathy requiring inotropes (12/2) and impella (12/12) support.  Ultimately underwent redo sternotomy and CABG x 4 on 01/10/2020 leaving impella in place.  Unfortunately postop course overnight was pretty rocky with increasing vasopressor support needed.  Repeat echo reveals worsening RV failure with concern for chest compartment syndrome.  Patient to undergo bedside mediastinal exploration with VA ECMO salvage if does not improve clinical condition.   Past Medical History:  Diagnosis Date  . Anginal pain ([place])   . Anxiety   . Arthritis   . Automatic implantable cardioverter-defibrillator in situ 2007   [employer]   . Bipolar disorder ([place])   . CAD (coronary artery disease)    Cardiac cath (08/19/2000) - Nonobstructive, 40% stenosis of LAD,.   . Cancer [facility]) 2013   benign stomach cancer  . CHF (congestive heart failure) ([place])   . Chondrocalcinosis of right knee 06/11/2013  . Chronic systolic heart failure ([place])    2D echo (70/4888) - Systolic function severely reduced., LV EF 20-25%. Akinesis of apical and anteroseptal mycoardium.  last 2D echo (11/2008 ), LV EF 25-30%, diffuse hypokinesis, and grade 1 diastolic dysfunction.  . Depression   . Diabetes uncomplicated  adult-type II    on insulin therapy, a1c 12.9 in 01/2011  . Dilated cardiomyopathy ([place])    status post ICD placement in 2008 c/b tearing at the atrial junction resulting in tamponade and urgent thoracotomy  . Dyslipidemia   . Dysrhythmia   . GERD (gastroesophageal reflux disease)   . Gout, unspecified   . Hepatic steatosis 2011   seen on ultrasound.   . History of kidney stones   . Hx of echocardiogram    Echo (04/2013):  Mild LVH, EF 20-25%, mild LAE, mild to mod RVE with mild reduced RVSF.  Marland Kitchen Hypertension   . Obesity (BMI 30.0-34.9)   . Pacemaker   . Paroxysmal atrial fibrillation (HCC)    on chronic coumadin, goal INR 2-3. status post direct current  . Peptic ulcer   . Shortness of breath   . Sleep apnea    does not have CPAP    Past Surgical History:  Procedure Laterality Date  . BIOPSY  12/01/2019   Procedure: BIOPSY;  Surgeon: Arta Silence, MD;  Location: [facility];  Service: Endoscopy;;  . CARDIAC CATHETERIZATION    . CARDIAC PACEMAKER PLACEMENT    . CHOLECYSTECTOMY    . COLONOSCOPY    . CORONARY ARTERY BYPASS GRAFT    . ESOPHAGOGASTRODUODENOSCOPY  04/05/2011   Procedure: ESOPHAGOGASTRODUODENOSCOPY (EGD);  Surgeon: Irene Shipper, MD;  Location: [facility] ENDOSCOPY;  Service: Endoscopy;  Laterality: N/A;  . ESOPHAGOGASTRODUODENOSCOPY (EGD) WITH PROPOFOL N/A 12/01/2019   Procedure: ESOPHAGOGASTRODUODENOSCOPY (EGD) WITH PROPOFOL;  Surgeon: Arta Silence, MD;  Location: [facility];  Service: Endoscopy;  Laterality: N/A;  . JOINT REPLACEMENT     left knee replacement  . KNEE ARTHROSCOPY Right 06/11/2013   Procedure: RIGHT KNEE ARTHROSCOPY with chondroplasty;  Surgeon: Johnny Bridge, MD;  Location: [place];  Service: Orthopedics;  Laterality: Right;  . PLACEMENT OF IMPELLA LEFT VENTRICULAR ASSIST DEVICE Right 01/11/2020   Procedure: AXILLARY  ARTERY PLACEMENT OF ABIOMED IMPELLA  5.5 LEFT VENTRICULAR ASSIST DEVICE;  Surgeon: [name], MD;  Location: [place];  Service:  Open Heart Surgery;  Laterality: Right;  . PLACEMENT OF IMPELLA LEFT VENTRICULAR ASSIST DEVICE N/A 01/01/2020   Procedure: PLACEMENT OF IMPELLA LEFT VENTRICULAR ASSIST DEVICE;  Surgeon: [name], MD;  Location: [place];  Service: Open Heart Surgery;  Laterality: N/A;  . RIGHT/LEFT HEART CATH AND CORONARY ANGIOGRAPHY N/A 12/31/2019   Procedure: RIGHT/LEFT HEART CATH AND CORONARY ANGIOGRAPHY;  Surgeon: Larey Dresser, MD;  Location: [facility];  Service: Cardiovascular;  Laterality: N/A;  . TOTAL KNEE ARTHROPLASTY Left     Family History  Problem Relation Age of Onset  . Diabetes Mother   . Coronary artery disease Mother 23  . Gout Mother   . Heart disease Mother   . Heart attack Mother 56  . Diabetes Father   . Coronary artery disease Father 53       Died in a house fire   . Heart disease Father   . Heart disease Brother     Social History:  reports that he has never smoked. He has never used smokeless tobacco. He reports that he does not drink alcohol and does not use drugs.  Allergies:  Allergies  Allergen Reactions  . Orange Fruit Anaphylaxis, Hives and Other (See Comments)    Per Pt- Blisters around lips and Hives all over, also  . Penicillins Anaphylaxis    Did it involve swelling of the face/tongue/throat, SOB, or low BP? Yes Did it involve sudden or severe rash/hives, skin peeling, or any reaction on the inside of your mouth or nose? Yes Did you need to seek medical attention at a hospital or doctor's office? No When did it last happen?childhood If all above answers are "NO", may proceed with cephalosporin use.  [name] [Empagliflozin] Itching  . [name] [Insulin Glargine] Nausea And Vomiting    Medications: I have reviewed the patient's current medications.  Results for orders placed or performed during the hospital encounter of 01/22/2020 (from the past 48 hour(s))  Glucose, capillary     Status: Abnormal   Collection Time: 01/11/20   8:28 AM  Result Value Ref Range   Glucose-Capillary 143 (H) 70 - 99 mg/dL    Comment: Glucose reference range applies only to samples taken after fasting for at least 8 hours.  Basic metabolic panel     Status: Abnormal   Collection Time: 01/11/20  8:43 AM  Result Value Ref Range   Sodium 133 (L) 135 - 145 mmol/L   Potassium 3.7 3.5 - 5.1 mmol/L   Chloride 98 98 - 111 mmol/L   CO2 22 22 - 32 mmol/L   Glucose, Bld 145 (H) 70 - 99 mg/dL    Comment: Glucose reference range applies only to samples taken after fasting for at least 8 hours.   BUN 22 (H) 6 - 20 mg/dL   Creatinine, Ser 1.36 (H) 0.61 - 1.24 mg/dL   Calcium 8.8 (L) 8.9 - 10.3 mg/dL   GFR, Estimated >60 >60 mL/min    Comment: (NOTE) Calculated using the CKD-EPI Creatinine Equation (2021)    Anion gap 13 5 - 15    Comment: Performed at [facility] [address] [place],  [address]  Urinalysis, Routine w reflex microscopic Urine, Catheterized     Status: Abnormal   Collection Time: 01/11/20  8:43 AM  Result Value Ref Range   Color, Urine YELLOW YELLOW   APPearance CLEAR CLEAR   Specific Gravity, Urine 1.021 1.005 - 1.030   pH  5.0 5.0 - 8.0   Glucose, UA >=500 (A) NEGATIVE mg/dL   Hgb urine dipstick SMALL (A) NEGATIVE   Bilirubin Urine NEGATIVE NEGATIVE   Ketones, ur NEGATIVE NEGATIVE mg/dL   Protein, ur 30 (A) NEGATIVE mg/dL   Nitrite NEGATIVE NEGATIVE   Leukocytes,Ua SMALL (A) NEGATIVE   RBC / HPF 11-20 0 - 5 RBC/hpf   WBC, UA 11-20 0 - 5 WBC/hpf   Bacteria, UA RARE (A) NONE SEEN   Squamous Epithelial / LPF 0-5 0 - 5    Comment: Performed at [facility], [place] [address], [place], [place] [address]  Culture, blood (routine x 2)     Status: None (Preliminary result)   Collection Time: 01/11/20  9:42 AM   Specimen: BLOOD  Result Value Ref Range   Specimen Description BLOOD LEFT ANTECUBITAL    Special Requests      BOTTLES DRAWN AEROBIC AND ANAEROBIC Blood Culture adequate volume   Culture       NO GROWTH 1 DAY Performed at [facility], [place] [address] [place], [place] [address]    Report Status PENDING   Culture, blood (routine x 2)     Status: None (Preliminary result)   Collection Time: 01/11/20  9:42 AM   Specimen: BLOOD  Result Value Ref Range   Specimen Description BLOOD LEFT ANTECUBITAL    Special Requests      BOTTLES DRAWN AEROBIC ONLY Blood Culture results may not be optimal due to an inadequate volume of blood received in culture bottles   Culture      NO GROWTH 1 DAY Performed at [facility] [address], [place], [place] [address]    Report Status PENDING   Glucose, capillary     Status: Abnormal   Collection Time: 01/11/20 11:29 AM  Result Value Ref Range   Glucose-Capillary 161 (H) 70 - 99 mg/dL    Comment: Glucose reference range applies only to samples taken after fasting for at least 8 hours.  Cooxemetry Panel (carboxy, met, total hgb, O2 sat)     Status: Abnormal   Collection Time: 01/11/20 12:42 PM  Result Value Ref Range   Total hemoglobin 12.8 12.0 - 16.0 g/dL   O2 Saturation 71.1 %   Carboxyhemoglobin 1.7 (H) 0.5 - 1.5 %   Methemoglobin 1.0 0.0 - 1.5 %    Comment: Performed at [facility] [address] [place], [place] [address]  Glucose, capillary     Status: Abnormal   Collection Time: 01/11/20  3:38 PM  Result Value Ref Range   Glucose-Capillary 194 (H) 70 - 99 mg/dL    Comment: Glucose reference range applies only to samples taken after fasting for at least 8 hours.  Type and screen [place]     Status: None (Preliminary result)   Collection Time: 01/11/20  7:25 PM  Result Value Ref Range   ABO/RH(D) A POS    Antibody Screen NEG    Sample Expiration 01/14/2020,2359    Unit Number [id number]    Blood Component Type RED CELLS,LR    Unit division 00    Status of Unit ISSUED    Transfusion Status OK TO TRANSFUSE    Crossmatch Result      Compatible Performed at [facility],  [place] [address], [place], [place] [address]    Unit Number [id number]    Blood Component Type RED CELLS,LR    Unit division 00  Status of Unit ALLOCATED    Transfusion Status OK TO TRANSFUSE    Crossmatch Result Compatible    Unit Number [id number]    Blood Component Type RED CELLS,LR    Unit division 00    Status of Unit ISSUED    Transfusion Status OK TO TRANSFUSE    Crossmatch Result Compatible    Unit Number [id number]    Blood Component Type RED CELLS,LR    Unit division 00    Status of Unit ISSUED    Transfusion Status OK TO TRANSFUSE    Crossmatch Result Compatible   Glucose, capillary     Status: Abnormal   Collection Time: 01/11/20  8:29 PM  Result Value Ref Range   Glucose-Capillary 174 (H) 70 - 99 mg/dL    Comment: Glucose reference range applies only to samples taken after fasting for at least 8 hours.  Heparin level (unfractionated)     Status: Abnormal   Collection Time: 01/10/2020  4:26 AM  Result Value Ref Range   Heparin Unfractionated 0.18 (L) 0.30 - 0.70 IU/mL    Comment: (NOTE) If heparin results are below expected values, and patient dosage has  been confirmed, suggest follow up testing of antithrombin III levels. Performed at [facility], [place] [address] [place], [place] [address]   CBC     Status: Abnormal   Collection Time: 01/16/2020  4:26 AM  Result Value Ref Range   WBC 13.6 (H) 4.0 - 10.5 K/uL   RBC 4.71 4.22 - 5.81 MIL/uL   Hemoglobin 12.3 (L) 13.0 - 17.0 g/dL   HCT 38.7 (L) 39.0 - 52.0 %   MCV 82.2 80.0 - 100.0 fL   MCH 26.1 26.0 - 34.0 pg   MCHC 31.8 30.0 - 36.0 g/dL   RDW 15.9 (H) 11.5 - 15.5 %   Platelets 257 150 - 400 K/uL   nRBC 0.0 0.0 - 0.2 %    Comment: Performed at [facility], [place] [address] [place], [place] [address]  Magnesium     Status: None   Collection Time: 01/04/2020  4:26 AM  Result Value Ref Range   Magnesium 2.0 1.7 - 2.4 mg/dL    Comment: Performed at [facility] [address],  [place], [place] [address]  Lactate dehydrogenase     Status: Abnormal   Collection Time: 01/03/2020  4:26 AM  Result Value Ref Range   LDH 286 (H) 98 - 192 U/L    Comment: Performed at [facility], [place] [address], [place],  [address]  Basic metabolic panel     Status: Abnormal   Collection Time: 12/31/2019  4:26 AM  Result Value Ref Range   Sodium 130 (L) 135 - 145 mmol/L   Potassium 4.0 3.5 - 5.1 mmol/L   Chloride 98 98 - 111 mmol/L   CO2 23 22 - 32 mmol/L   Glucose, Bld 152 (H) 70 - 99 mg/dL    Comment: Glucose reference range applies only to samples taken after fasting for at least 8 hours.   BUN 25 (H) 6 - 20 mg/dL   Creatinine, Ser 1.44 (H) 0.61 - 1.24 mg/dL   Calcium 8.7 (L) 8.9 - 10.3 mg/dL   GFR, Estimated 59 (L) >60 mL/min    Comment: (NOTE) Calculated using the CKD-EPI Creatinine Equation (2021)    Anion gap 9 5 - 15    Comment: Performed at [facility] [address], [place], [place]  [address]  Cooxemetry Panel (carboxy, met, total hgb, O2 sat)     Status: Abnormal   Collection Time: 12/31/2019  4:26 AM  Result Value Ref Range   Total hemoglobin 12.9 12.0 - 16.0 g/dL   O2 Saturation 79.4 %   Carboxyhemoglobin 1.9 (H) 0.5 - 1.5 %   Methemoglobin 0.8 0.0 - 1.5 %    Comment: Performed at [facility] [address] [place], [place] [address]  Glucose, capillary     Status: Abnormal   Collection Time: 01/11/2020  4:32 AM  Result Value Ref Range   Glucose-Capillary 141 (H) 70 - 99 mg/dL    Comment: Glucose reference range applies only to samples taken after fasting for at least 8 hours.  Prepare RBC (crossmatch)     Status: None   Collection Time: 01/06/2020  6:54 AM  Result Value Ref Range   Order Confirmation      ORDER PROCESSED BY BLOOD BANK Performed at [facility], [address], [place], [place] [address]   Glucose, capillary     Status: Abnormal   Collection Time: 01/27/2020  7:44 AM  Result Value Ref Range   Glucose-Capillary 136 (H)  70 - 99 mg/dL    Comment: Glucose reference range applies only to samples taken after fasting for at least 8 hours.  I-STAT 7, (LYTES, BLD GAS, ICA, H+H)     Status: Abnormal   Collection Time: 01/04/2020  9:21 AM  Result Value Ref Range   pH, Arterial 7.296 (L) 7.350 - 7.450   pCO2 arterial 53.4 (H) 32.0 - 48.0 mmHg   pO2, Arterial 419 (H) 83.0 - 108.0 mmHg   Bicarbonate 26.0 20.0 - 28.0 mmol/L   TCO2 28 22 - 32 mmol/L   O2 Saturation 100.0 %   Acid-base deficit 1.0 0.0 - 2.0 mmol/L   Sodium 131 (L) 135 - 145 mmol/L   Potassium 4.2 3.5 - 5.1 mmol/L   Calcium, Ion 1.22 1.15 - 1.40 mmol/L   HCT 39.0 39.0 - 52.0 %   Hemoglobin 13.3 13.0 - 17.0 g/dL   Sample type ARTERIAL   Prepare Pheresed Platelets     Status: None (Preliminary result)   Collection Time: 01/17/2020  9:33 AM  Result Value Ref Range   Unit Number [id number]    Blood Component Type PLTP2 PSORALEN TREATED    Unit division 00    Status of Unit ISSUED    Transfusion Status OK TO TRANSFUSE    Unit Number [id number]    Blood Component Type PLTP2 PSORALEN TREATED    Unit division 00    Status of Unit REL FROM [facility]    Transfusion Status      OK TO TRANSFUSE Performed at [facility], [address] [place], [place] [address]   Prepare fresh frozen plasma     Status: None (Preliminary result)   Collection Time: 01/03/2020  9:33 AM  Result Value Ref Range   Unit Number [id number]    Blood Component Type THAWED PLASMA    Unit division 00    Status of Unit ISSUED    Transfusion Status      OK TO TRANSFUSE Performed at [facility] [address] [place], [place] [address]    Unit Number [id number]    Blood Component Type THW PLS APHR    Unit division A0    Status of Unit ISSUED    Transfusion Status OK TO TRANSFUSE   Prepare cryoprecipitate  Status: None   Collection Time: 01/14/2020  9:33 AM  Result Value Ref Range   Unit Number [id number]    Blood Component Type CRYPOOL THAW    Unit  division 00    Status of Unit REL FROM [facility]    Transfusion Status OK TO TRANSFUSE    Unit Number [id number]    Blood Component Type CRYPOOL THAW    Unit division 00    Status of Unit REL FROM [facility]    Transfusion Status      OK TO TRANSFUSE Performed at [facility], [place] [address] [place], [place] [address]   I-STAT, D[place] Clap 8     Status: Abnormal   Collection Time: 01/10/2020  9:37 AM  Result Value Ref Range   Sodium 132 (L) 135 - 145 mmol/L   Potassium 4.2 3.5 - 5.1 mmol/L   Chloride 97 (L) 98 - 111 mmol/L   BUN 25 (H) 6 - 20 mg/dL   Creatinine, Ser 1.30 (H) 0.61 - 1.24 mg/dL   Glucose, Bld 177 (H) 70 - 99 mg/dL    Comment: Glucose reference range applies only to samples taken after fasting for at least 8 hours.   Calcium, Ion 1.26 1.15 - 1.40 mmol/L   TCO2 36 (H) 22 - 32 mmol/L   Hemoglobin 13.3 13.0 - 17.0 g/dL   HCT 39.0 39.0 - 52.0 %  I-STAT, chem 8     Status: Abnormal   Collection Time: 01/16/2020 11:14 AM  Result Value Ref Range   Sodium 130 (L) 135 - 145 mmol/L   Potassium 4.6 3.5 - 5.1 mmol/L   Chloride 97 (L) 98 - 111 mmol/L   BUN 25 (H) 6 - 20 mg/dL   Creatinine, Ser 1.30 (H) 0.61 - 1.24 mg/dL   Glucose, Bld 186 (H) 70 - 99 mg/dL    Comment: Glucose reference range applies only to samples taken after fasting for at least 8 hours.   Calcium, Ion 1.25 1.15 - 1.40 mmol/L   TCO2 24 22 - 32 mmol/L   Hemoglobin 12.6 (L) 13.0 - 17.0 g/dL   HCT 37.0 (L) 39.0 - 52.0 %  I-STAT 7, (LYTES, BLD GAS, ICA, H+H)     Status: Abnormal   Collection Time: 12/30/2019 11:26 AM  Result Value Ref Range   pH, Arterial 7.227 (L) 7.350 - 7.450   pCO2 arterial 59.7 (H) 32.0 - 48.0 mmHg   pO2, Arterial 274 (H) 83.0 - 108.0 mmHg   Bicarbonate 24.8 20.0 - 28.0 mmol/L   TCO2 27 22 - 32 mmol/L   O2 Saturation 100.0 %   Acid-base deficit 3.0 (H) 0.0 - 2.0 mmol/L   Sodium 132 (L) 135 - 145 mmol/L   Potassium 4.5 3.5 - 5.1 mmol/L   Calcium, Ion 1.11 (L) 1.15 - 1.40 mmol/L   HCT 29.0  (L) 39.0 - 52.0 %   Hemoglobin 9.9 (L) 13.0 - 17.0 g/dL   Sample type ARTERIAL   POCT I-Stat EG7     Status: Abnormal   Collection Time: 01/15/2020 11:33 AM  Result Value Ref Range   pH, Ven 7.218 (L) 7.250 - 7.430   pCO2, Ven 50.0 44.0 - 60.0 mmHg   pO2, Ven 44.0 32.0 - 45.0 mmHg   Bicarbonate 20.4 20.0 - 28.0 mmol/L   TCO2 22 22 - 32 mmol/L   O2 Saturation 70.0 %   Acid-base deficit 7.0 (H) 0.0 - 2.0 mmol/L   Sodium 137 135 - 145 mmol/L  Potassium 3.8 3.5 - 5.1 mmol/L   Calcium, Ion 1.03 (L) 1.15 - 1.40 mmol/L   HCT 26.0 (L) 39.0 - 52.0 %   Hemoglobin 8.8 (L) 13.0 - 17.0 g/dL   Sample type MIXED VENOUS SAMPLE   I-STAT, chem 8     Status: Abnormal   Collection Time: 01/10/2020 12:01 PM  Result Value Ref Range   Sodium 134 (L) 135 - 145 mmol/L   Potassium 4.9 3.5 - 5.1 mmol/L   Chloride 97 (L) 98 - 111 mmol/L   BUN 24 (H) 6 - 20 mg/dL   Creatinine, Ser 1.20 0.61 - 1.24 mg/dL   Glucose, Bld 177 (H) 70 - 99 mg/dL    Comment: Glucose reference range applies only to samples taken after fasting for at least 8 hours.   Calcium, Ion 1.09 (L) 1.15 - 1.40 mmol/L   TCO2 27 22 - 32 mmol/L   Hemoglobin 10.2 (L) 13.0 - 17.0 g/dL   HCT 30.0 (L) 39.0 - 52.0 %  I-STAT 7, (LYTES, BLD GAS, ICA, H+H)     Status: Abnormal   Collection Time: 01/21/2020 12:37 PM  Result Value Ref Range   pH, Arterial 7.361 7.350 - 7.450   pCO2 arterial 43.0 32.0 - 48.0 mmHg   pO2, Arterial 361 (H) 83.0 - 108.0 mmHg   Bicarbonate 24.3 20.0 - 28.0 mmol/L   TCO2 26 22 - 32 mmol/L   O2 Saturation 100.0 %   Acid-base deficit 1.0 0.0 - 2.0 mmol/L   Sodium 131 (L) 135 - 145 mmol/L   Potassium 4.8 3.5 - 5.1 mmol/L   Calcium, Ion 1.12 (L) 1.15 - 1.40 mmol/L   HCT 27.0 (L) 39.0 - 52.0 %   Hemoglobin 9.2 (L) 13.0 - 17.0 g/dL   Sample type ARTERIAL   Hemoglobin and hematocrit, blood     Status: Abnormal   Collection Time: 01/07/2020 12:42 PM  Result Value Ref Range   Hemoglobin 8.5 (L) 13.0 - 17.0 g/dL    Comment:  REPEATED TO VERIFY DELTA CHECK NOTED, RESULT CALLED TO, READ BACK BY AND VERIFIED WITH DEAN FUTRELL RN [id number] PHILLIPS C    HCT 26.8 (L) 39.0 - 52.0 %    Comment: Performed at [facility], [address] [place], [place] [address]  Platelet count     Status: None   Collection Time: 01/23/2020 12:42 PM  Result Value Ref Range   Platelets 208 150 - 400 K/uL    Comment: Performed at [facility], [place] [address], [place], [place] [address]  I-STAT, Danton Clap 8     Status: Abnormal   Collection Time: 01/19/2020  1:07 PM  Result Value Ref Range   Sodium 130 (L) 135 - 145 mmol/L   Potassium 5.6 (H) 3.5 - 5.1 mmol/L   Chloride 98 98 - 111 mmol/L   BUN 25 (H) 6 - 20 mg/dL   Creatinine, Ser 1.20 0.61 - 1.24 mg/dL   Glucose, Bld 155 (H) 70 - 99 mg/dL    Comment: Glucose reference range applies only to samples taken after fasting for at least 8 hours.   Calcium, Ion 1.14 (L) 1.15 - 1.40 mmol/L   TCO2 22 22 - 32 mmol/L   Hemoglobin 9.2 (L) 13.0 - 17.0 g/dL   HCT 27.0 (L) 39.0 - 52.0 %  I-STAT 7, (LYTES, BLD GAS, ICA, H+H)     Status: Abnormal   Collection Time: 01/01/2020  1:16 PM  Result Value Ref Range  pH, Arterial 7.339 (L) 7.350 - 7.450   pCO2 arterial 45.7 32.0 - 48.0 mmHg   pO2, Arterial 345 (H) 83.0 - 108.0 mmHg   Bicarbonate 24.6 20.0 - 28.0 mmol/L   TCO2 26 22 - 32 mmol/L   O2 Saturation 100.0 %   Acid-base deficit 1.0 0.0 - 2.0 mmol/L   Sodium 131 (L) 135 - 145 mmol/L   Potassium 5.6 (H) 3.5 - 5.1 mmol/L   Calcium, Ion 1.12 (L) 1.15 - 1.40 mmol/L   HCT 28.0 (L) 39.0 - 52.0 %   Hemoglobin 9.5 (L) 13.0 - 17.0 g/dL   Sample type ARTERIAL   I-STAT 7, (LYTES, BLD GAS, ICA, H+H)     Status: Abnormal   Collection Time: 12/31/2019  1:55 PM  Result Value Ref Range   pH, Arterial 7.168 (LL) 7.350 - 7.450   pCO2 arterial 63.9 (H) 32.0 - 48.0 mmHg   pO2, Arterial 97 83.0 - 108.0 mmHg   Bicarbonate 23.2 20.0 - 28.0 mmol/L   TCO2 25 22 - 32 mmol/L   O2 Saturation 95.0 %    Acid-base deficit 6.0 (H) 0.0 - 2.0 mmol/L   Sodium 133 (L) 135 - 145 mmol/L   Potassium 4.7 3.5 - 5.1 mmol/L   Calcium, Ion 1.19 1.15 - 1.40 mmol/L   HCT 29.0 (L) 39.0 - 52.0 %   Hemoglobin 9.9 (L) 13.0 - 17.0 g/dL   Sample type ARTERIAL   I-STAT, chem 8     Status: Abnormal   Collection Time: 01/15/2020  2:00 PM  Result Value Ref Range   Sodium 132 (L) 135 - 145 mmol/L   Potassium 4.7 3.5 - 5.1 mmol/L   Chloride 98 98 - 111 mmol/L   BUN 26 (H) 6 - 20 mg/dL   Creatinine, Ser 1.30 (H) 0.61 - 1.24 mg/dL   Glucose, Bld 166 (H) 70 - 99 mg/dL    Comment: Glucose reference range applies only to samples taken after fasting for at least 8 hours.   Calcium, Ion 1.19 1.15 - 1.40 mmol/L   TCO2 22 22 - 32 mmol/L   Hemoglobin 9.9 (L) 13.0 - 17.0 g/dL   HCT 29.0 (L) 39.0 - 52.0 %  CBC     Status: Abnormal   Collection Time: 01/21/2020  3:18 PM  Result Value Ref Range   WBC 29.6 (H) 4.0 - 10.5 K/uL   RBC 3.29 (L) 4.22 - 5.81 MIL/uL   Hemoglobin 9.1 (L) 13.0 - 17.0 g/dL   HCT 28.0 (L) 39.0 - 52.0 %   MCV 85.1 80.0 - 100.0 fL   MCH 27.7 26.0 - 34.0 pg   MCHC 32.5 30.0 - 36.0 g/dL   RDW 16.0 (H) 11.5 - 15.5 %   Platelets 231 150 - 400 K/uL   nRBC 0.0 0.0 - 0.2 %    Comment: Performed at [facility], [place] [address] [place], [place] [address]  Protime-INR     Status: Abnormal   Collection Time: 01/21/2020  3:18 PM  Result Value Ref Range   Prothrombin Time 19.5 (H) 11.4 - 15.2 seconds   INR 1.7 (H) 0.8 - 1.2    Comment: (NOTE) INR goal varies based on device and disease states. Performed at [facility], [place] [address], [place], [place] [address]   APTT     Status: Abnormal   Collection Time: 01/23/2020  3:18 PM  Result Value Ref Range   aPTT 40 (H) 24 - 36 seconds  Comment:        IF BASELINE aPTT IS ELEVATED, SUGGEST PATIENT RISK ASSESSMENT BE USED TO DETERMINE APPROPRIATE ANTICOAGULANT THERAPY. Performed at [facility], [place] [address] [place], [place] [address]    Glucose, capillary     Status: Abnormal   Collection Time: 01/07/2020  3:33 PM  Result Value Ref Range   Glucose-Capillary 140 (H) 70 - 99 mg/dL    Comment: Glucose reference range applies only to samples taken after fasting for at least 8 hours.  I-STAT 7, (LYTES, BLD GAS, ICA, H+H)     Status: Abnormal   Collection Time: 01/04/2020  3:36 PM  Result Value Ref Range   pH, Arterial 7.155 (LL) 7.350 - 7.450   pCO2 arterial 66.0 (HH) 32.0 - 48.0 mmHg   pO2, Arterial 123 (H) 83.0 - 108.0 mmHg   Bicarbonate 23.1 20.0 - 28.0 mmol/L   TCO2 25 22 - 32 mmol/L   O2 Saturation 97.0 %   Acid-base deficit 6.0 (H) 0.0 - 2.0 mmol/L   Sodium 134 (L) 135 - 145 mmol/L   Potassium 4.5 3.5 - 5.1 mmol/L   Calcium, Ion 1.32 1.15 - 1.40 mmol/L   HCT 30.0 (L) 39.0 - 52.0 %   Hemoglobin 10.2 (L) 13.0 - 17.0 g/dL   Patient temperature 37.6 C    Collection site art line    Drawn by Operator    Sample type ARTERIAL    Comment NOTIFIED PHYSICIAN   Glucose, capillary     Status: Abnormal   Collection Time: 01/07/2020  4:53 PM  Result Value Ref Range   Glucose-Capillary 128 (H) 70 - 99 mg/dL    Comment: Glucose reference range applies only to samples taken after fasting for at least 8 hours.  Prepare fresh frozen plasma     Status: None (Preliminary result)   Collection Time: 01/27/2020  5:00 PM  Result Value Ref Range   Unit Number [id number]    Blood Component Type THAWED PLASMA    Unit division 00    Status of Unit ISSUED    Transfusion Status      OK TO TRANSFUSE Performed at [facility] [address] [place], [place] [address]   I-STAT 7, (LYTES, BLD GAS, ICA, H+H)     Status: Abnormal   Collection Time: 01/19/2020  5:05 PM  Result Value Ref Range   pH, Arterial 7.287 (L) 7.350 - 7.450   pCO2 arterial 55.0 (H) 32.0 - 48.0 mmHg   pO2, Arterial 202 (H) 83.0 - 108.0 mmHg   Bicarbonate 26.1 20.0 - 28.0 mmol/L   TCO2 28 22 - 32 mmol/L   O2 Saturation 100.0 %   Acid-base deficit 1.0 0.0 - 2.0  mmol/L   Sodium 133 (L) 135 - 145 mmol/L   Potassium 4.3 3.5 - 5.1 mmol/L   Calcium, Ion 1.35 1.15 - 1.40 mmol/L   HCT 23.0 (L) 39.0 - 52.0 %   Hemoglobin 7.8 (L) 13.0 - 17.0 g/dL   Patient temperature 37.3 C    Sample type ARTERIAL   Glucose, capillary     Status: Abnormal   Collection Time: 01/10/2020  5:56 PM  Result Value Ref Range   Glucose-Capillary 114 (H) 70 - 99 mg/dL    Comment: Glucose reference range applies only to samples taken after fasting for at least 8 hours.  I-STAT 7, (LYTES, BLD GAS, ICA, H+H)     Status: Abnormal   Collection Time: 01/07/2020  7:21 PM  Result Value Ref Range   pH, Arterial 7.369 7.350 - 7.450   pCO2 arterial 38.4 32.0 - 48.0 mmHg   pO2, Arterial 223 (H) 83.0 - 108.0 mmHg   Bicarbonate 22.0 20.0 - 28.0 mmol/L   TCO2 23 22 - 32 mmol/L   O2 Saturation 100.0 %   Acid-base deficit 3.0 (H) 0.0 - 2.0 mmol/L   Sodium 134 (L) 135 - 145 mmol/L   Potassium 4.0 3.5 - 5.1 mmol/L   Calcium, Ion 1.23 1.15 - 1.40 mmol/L   HCT 20.0 (L) 39.0 - 52.0 %   Hemoglobin 6.8 (LL) 13.0 - 17.0 g/dL   Patient temperature 37.6 C    Sample type ARTERIAL   Glucose, capillary     Status: Abnormal   Collection Time: 01/18/2020  8:02 PM  Result Value Ref Range   Glucose-Capillary 114 (H) 70 - 99 mg/dL    Comment: Glucose reference range applies only to samples taken after fasting for at least 8 hours.  CBC     Status: Abnormal   Collection Time: 01/24/2020  8:26 PM  Result Value Ref Range   WBC 19.9 (H) 4.0 - 10.5 K/uL   RBC 2.44 (L) 4.22 - 5.81 MIL/uL   Hemoglobin 6.4 (LL) 13.0 - 17.0 g/dL    Comment: REPEATED TO VERIFY THIS CRITICAL RESULT HAS VERIFIED AND BEEN CALLED TO M.AMO RN BY KATHERINE MCCORMICK ON 12 15 2021 AT 2112, AND HAS BEEN READ BACK.     HCT 20.8 (L) 39.0 - 52.0 %   MCV 85.2 80.0 - 100.0 fL   MCH 26.2 26.0 - 34.0 pg   MCHC 30.8 30.0 - 36.0 g/dL   RDW 15.9 (H) 11.5 - 15.5 %   Platelets 200 150 - 400 K/uL   nRBC 0.0 0.0 - 0.2 %    Comment: Performed at  [facility] [address] [place], [place] [address]  Magnesium     Status: Abnormal   Collection Time: 01/15/2020  8:26 PM  Result Value Ref Range   Magnesium 2.5 (H) 1.7 - 2.4 mg/dL    Comment: Performed at [facility] [address] [place], [place] [address]  Basic metabolic panel     Status: Abnormal   Collection Time: 01/02/2020  8:26 PM  Result Value Ref Range   Sodium 132 (L) 135 - 145 mmol/L   Potassium 4.1 3.5 - 5.1 mmol/L   Chloride 101 98 - 111 mmol/L   CO2 20 (L) 22 - 32 mmol/L   Glucose, Bld 146 (H) 70 - 99 mg/dL    Comment: Glucose reference range applies only to samples taken after fasting for at least 8 hours.   BUN 24 (H) 6 - 20 mg/dL   Creatinine, Ser 1.48 (H) 0.61 - 1.24 mg/dL   Calcium 9.9 8.9 - 10.3 mg/dL   GFR, Estimated 57 (L) >60 mL/min    Comment: (NOTE) Calculated using the CKD-EPI Creatinine Equation (2021)    Anion gap 11 5 - 15    Comment: Performed at [facility] [address], [place], [place] [address]  I-STAT 7, (LYTES, BLD GAS, ICA, H+H)     Status: Abnormal   Collection Time: 01/16/2020  8:28 PM  Result Value Ref Range   pH, Arterial 7.329 (L) 7.350 - 7.450   pCO2 arterial 39.3 32.0 - 48.0 mmHg   pO2, Arterial 140 (H) 83.0 - 108.0 mmHg   Bicarbonate 20.7 20.0 - 28.0 mmol/L   TCO2  22 22 - 32 mmol/L   O2 Saturation 99.0 %   Acid-base deficit 5.0 (H) 0.0 - 2.0 mmol/L   Sodium 134 (L) 135 - 145 mmol/L   Potassium 4.1 3.5 - 5.1 mmol/L   Calcium, Ion 1.49 (H) 1.15 - 1.40 mmol/L   HCT 19.0 (L) 39.0 - 52.0 %   Hemoglobin 6.5 (LL) 13.0 - 17.0 g/dL   Sample type ARTERIAL   I-STAT 7, (LYTES, BLD GAS, ICA, H+H)     Status: Abnormal   Collection Time: 01/03/2020  8:39 PM  Result Value Ref Range   pH, Arterial 7.342 (L) 7.350 - 7.450   pCO2 arterial 39.0 32.0 - 48.0 mmHg   pO2, Arterial 225 (H) 83.0 - 108.0 mmHg   Bicarbonate 21.0 20.0 - 28.0 mmol/L   TCO2 22 22 - 32 mmol/L   O2 Saturation 100.0 %   Acid-base deficit 4.0 (H)  0.0 - 2.0 mmol/L   Sodium 133 (L) 135 - 145 mmol/L   Potassium 4.1 3.5 - 5.1 mmol/L   Calcium, Ion 1.41 (H) 1.15 - 1.40 mmol/L   HCT 17.0 (L) 39.0 - 52.0 %   Hemoglobin 5.8 (LL) 13.0 - 17.0 g/dL   Patient temperature 37.8 C    Sample type ARTERIAL   Prepare RBC (crossmatch)     Status: None   Collection Time: 01/21/2020  8:40 PM  Result Value Ref Range   Order Confirmation      ORDER PROCESSED BY BLOOD BANK Performed at [facility], [address], [place], [place] [address]   Glucose, capillary     Status: Abnormal   Collection Time: 12/30/2019 10:07 PM  Result Value Ref Range   Glucose-Capillary 130 (H) 70 - 99 mg/dL    Comment: Glucose reference range applies only to samples taken after fasting for at least 8 hours.  I-STAT 7, (LYTES, BLD GAS, ICA, H+H)     Status: Abnormal   Collection Time: 01/28/2020 10:09 PM  Result Value Ref Range   pH, Arterial 7.355 7.350 - 7.450   pCO2 arterial 37.7 32.0 - 48.0 mmHg   pO2, Arterial 188 (H) 83.0 - 108.0 mmHg   Bicarbonate 20.9 20.0 - 28.0 mmol/L   TCO2 22 22 - 32 mmol/L   O2 Saturation 100.0 %   Acid-base deficit 4.0 (H) 0.0 - 2.0 mmol/L   Sodium 135 135 - 145 mmol/L   Potassium 4.2 3.5 - 5.1 mmol/L   Calcium, Ion 1.27 1.15 - 1.40 mmol/L   HCT 21.0 (L) 39.0 - 52.0 %   Hemoglobin 7.1 (L) 13.0 - 17.0 g/dL   Patient temperature 38.0 C    Sample type ARTERIAL   CBC     Status: Abnormal   Collection Time: 01/06/2020 12:12 AM  Result Value Ref Range   WBC 16.8 (H) 4.0 - 10.5 K/uL   RBC 2.95 (L) 4.22 - 5.81 MIL/uL   Hemoglobin 8.4 (L) 13.0 - 17.0 g/dL    Comment: REPEATED TO VERIFY POST TRANSFUSION SPECIMEN DELTA CHECK NOTED    HCT 25.2 (L) 39.0 - 52.0 %   MCV 85.4 80.0 - 100.0 fL   MCH 28.5 26.0 - 34.0 pg   MCHC 33.3 30.0 - 36.0 g/dL   RDW 16.3 (H) 11.5 - 15.5 %   Platelets 201 150 - 400 K/uL   nRBC 0.0 0.0 - 0.2 %    Comment: Performed at [facility], [place] [address], [place], [place] [address]  Basic metabolic panel  Status: Abnormal   Collection Time: 01/15/2020 12:12 AM  Result Value Ref Range   Sodium 134 (L) 135 - 145 mmol/L   Potassium 4.2 3.5 - 5.1 mmol/L   Chloride 102 98 - 111 mmol/L   CO2 20 (L) 22 - 32 mmol/L   Glucose, Bld 146 (H) 70 - 99 mg/dL    Comment: Glucose reference range applies only to samples taken after fasting for at least 8 hours.   BUN 24 (H) 6 - 20 mg/dL   Creatinine, Ser 1.48 (H) 0.61 - 1.24 mg/dL   Calcium 8.7 (L) 8.9 - 10.3 mg/dL   GFR, Estimated 57 (L) >60 mL/min    Comment: (NOTE) Calculated using the CKD-EPI Creatinine Equation (2021)    Anion gap 12 5 - 15    Comment: Performed at [facility] [address], [place], [place] [address]  Glucose, capillary     Status: Abnormal   Collection Time: 01/16/2020 12:18 AM  Result Value Ref Range   Glucose-Capillary 148 (H) 70 - 99 mg/dL    Comment: Glucose reference range applies only to samples taken after fasting for at least 8 hours.  I-STAT 7, (LYTES, BLD GAS, ICA, H+H)     Status: Abnormal   Collection Time: 01/23/2020 12:20 AM  Result Value Ref Range   pH, Arterial 7.382 7.350 - 7.450   pCO2 arterial 38.5 32.0 - 48.0 mmHg   pO2, Arterial 125 (H) 83.0 - 108.0 mmHg   Bicarbonate 22.5 20.0 - 28.0 mmol/L   TCO2 24 22 - 32 mmol/L   O2 Saturation 99.0 %   Acid-base deficit 2.0 0.0 - 2.0 mmol/L   Sodium 135 135 - 145 mmol/L   Potassium 4.2 3.5 - 5.1 mmol/L   Calcium, Ion 1.25 1.15 - 1.40 mmol/L   HCT 24.0 (L) 39.0 - 52.0 %   Hemoglobin 8.2 (L) 13.0 - 17.0 g/dL   Patient temperature 38.6 C    Sample type ARTERIAL   Glucose, capillary     Status: Abnormal   Collection Time: 01/11/2020  1:08 AM  Result Value Ref Range   Glucose-Capillary 136 (H) 70 - 99 mg/dL    Comment: Glucose reference range applies only to samples taken after fasting for at least 8 hours.  Glucose, capillary     Status: Abnormal   Collection Time: 12/31/2019  2:16 AM  Result Value Ref Range   Glucose-Capillary 126 (H) 70 - 99 mg/dL     Comment: Glucose reference range applies only to samples taken after fasting for at least 8 hours.  Glucose, capillary     Status: Abnormal   Collection Time: 01/16/2020  3:09 AM  Result Value Ref Range   Glucose-Capillary 125 (H) 70 - 99 mg/dL    Comment: Glucose reference range applies only to samples taken after fasting for at least 8 hours.  I-STAT 7, (LYTES, BLD GAS, ICA, H+H)     Status: Abnormal   Collection Time: 01/03/2020  3:39 AM  Result Value Ref Range   pH, Arterial 7.399 7.350 - 7.450   pCO2 arterial 35.5 32.0 - 48.0 mmHg   pO2, Arterial 155 (H) 83.0 - 108.0 mmHg   Bicarbonate 21.6 20.0 - 28.0 mmol/L   TCO2 23 22 - 32 mmol/L   O2 Saturation 99.0 %   Acid-base deficit 2.0 0.0 - 2.0 mmol/L   Sodium 135 135 - 145 mmol/L   Potassium 4.4 3.5 - 5.1 mmol/L   Calcium, Ion 1.21 1.15 - 1.40  mmol/L   HCT 21.0 (L) 39.0 - 52.0 %   Hemoglobin 7.1 (L) 13.0 - 17.0 g/dL   Patient temperature 38.9 C    Sample type ARTERIAL   CBC     Status: Abnormal   Collection Time: 01/11/2020  3:40 AM  Result Value Ref Range   WBC 15.4 (H) 4.0 - 10.5 K/uL   RBC 2.62 (L) 4.22 - 5.81 MIL/uL   Hemoglobin 7.5 (L) 13.0 - 17.0 g/dL   HCT 22.3 (L) 39.0 - 52.0 %   MCV 85.1 80.0 - 100.0 fL   MCH 28.6 26.0 - 34.0 pg   MCHC 33.6 30.0 - 36.0 g/dL   RDW 16.2 (H) 11.5 - 15.5 %   Platelets 192 150 - 400 K/uL   nRBC 0.0 0.0 - 0.2 %    Comment: Performed at [facility], [place] [address] [place], [place] [address]  Basic metabolic panel     Status: Abnormal   Collection Time: 01/06/2020  3:40 AM  Result Value Ref Range   Sodium 133 (L) 135 - 145 mmol/L   Potassium 4.2 3.5 - 5.1 mmol/L   Chloride 102 98 - 111 mmol/L   CO2 19 (L) 22 - 32 mmol/L   Glucose, Bld 126 (H) 70 - 99 mg/dL    Comment: Glucose reference range applies only to samples taken after fasting for at least 8 hours.   BUN 25 (H) 6 - 20 mg/dL   Creatinine, Ser 1.62 (H) 0.61 - 1.24 mg/dL   Calcium 8.2 (L) 8.9 - 10.3 mg/dL   GFR, Estimated 51  (L) >60 mL/min    Comment: (NOTE) Calculated using the CKD-EPI Creatinine Equation (2021)    Anion gap 12 5 - 15    Comment: Performed at [facility] [address] [place], [place] [address]  Magnesium     Status: Abnormal   Collection Time: 01/14/2020  3:40 AM  Result Value Ref Range   Magnesium 2.5 (H) 1.7 - 2.4 mg/dL    Comment: Performed at [facility] [address], [place], [place] [address]  Protime-INR     Status: Abnormal   Collection Time: 01/17/2020  3:40 AM  Result Value Ref Range   Prothrombin Time 20.8 (H) 11.4 - 15.2 seconds   INR 1.9 (H) 0.8 - 1.2    Comment: (NOTE) INR goal varies based on device and disease states. Performed at [facility], [place] [address], [place], [place] [address]   Cooxemetry Panel (carboxy, met, total hgb, O2 sat)     Status: Abnormal   Collection Time: 01/14/2020  3:48 AM  Result Value Ref Range   Total hemoglobin 7.2 (L) 12.0 - 16.0 g/dL   O2 Saturation 50.9 %   Carboxyhemoglobin 1.4 0.5 - 1.5 %   Methemoglobin 1.5 0.0 - 1.5 %    Comment: Performed at [facility], [place] [address] [place], [place] [address]  Lactic acid, plasma     Status: Abnormal   Collection Time: 01/11/2020  3:52 AM  Result Value Ref Range   Lactic Acid, Venous 3.4 (HH) 0.5 - 1.9 mmol/L    Comment: CRITICAL RESULT CALLED TO, READ BACK BY AND VERIFIED WITH: AMO M,RN 01/23/2020 [place] Performed at [facility], [place] [address], [place], [place] [address]   Prepare RBC (crossmatch)     Status: None   Collection Time: 01/19/2020  4:38 AM  Result Value Ref Range   Order  Confirmation      ORDER PROCESSED BY BLOOD BANK Performed at [facility], [place] [address] [place], [place] [address]   Glucose, capillary     Status: Abnormal   Collection Time: 01/19/2020  5:31 AM  Result Value Ref Range   Glucose-Capillary 111 (H) 70 - 99 mg/dL    Comment: Glucose reference range applies only to samples taken after fasting for at least 8 hours.   I-STAT 7, (LYTES, BLD GAS, ICA, H+H)     Status: Abnormal   Collection Time: 01/04/2020  5:33 AM  Result Value Ref Range   pH, Arterial 7.349 (L) 7.350 - 7.450   pCO2 arterial 36.9 32.0 - 48.0 mmHg   pO2, Arterial 128 (H) 83.0 - 108.0 mmHg   Bicarbonate 19.9 (L) 20.0 - 28.0 mmol/L   TCO2 21 (L) 22 - 32 mmol/L   O2 Saturation 98.0 %   Acid-base deficit 5.0 (H) 0.0 - 2.0 mmol/L   Sodium 136 135 - 145 mmol/L   Potassium 4.5 3.5 - 5.1 mmol/L   Calcium, Ion 1.18 1.15 - 1.40 mmol/L   HCT 23.0 (L) 39.0 - 52.0 %   Hemoglobin 7.8 (L) 13.0 - 17.0 g/dL   Patient temperature 39.1 C    Sample type ARTERIAL   Glucose, capillary     Status: Abnormal   Collection Time: 01/27/2020  7:05 AM  Result Value Ref Range   Glucose-Capillary 119 (H) 70 - 99 mg/dL    Comment: Glucose reference range applies only to samples taken after fasting for at least 8 hours.    DG Chest 1 View  Result Date: 01/10/2020 CLINICAL DATA:  Post right bronchoscopy after heart surgery EXAM: CHEST  1 VIEW COMPARISON:  01/22/2020 FINDINGS: Interval re-expansion of right upper lobe atelectasis. Progression of hazy airspace disease bilaterally most likely pulmonary edema. Small pleural effusions bilaterally. Endotracheal tube in good position. Impella device in the left ventricle. Cardiac enlargement. Swan-Ganz catheter tip in the right main pulmonary artery. Right chest tube in place.  No pneumothorax. IMPRESSION: Interval re-expansion of right upper lobe atelectasis Progression of bilateral airspace disease may represent edema. Small bilateral effusions. No pneumothorax. Electronically Signed   By: Franchot Gallo M.D.   On: 01/26/2020 16:41   DG Chest Port 1 View  Result Date: 01/14/2020 CLINICAL DATA:  Post CABG. EXAM: PORTABLE CHEST 1 VIEW COMPARISON:  Radiograph yesterday. FINDINGS: Endotracheal tube tip is partially obscured but is approximately 3.6 cm from the carina. Enteric tube in place with tip and side-port below the  diaphragm in the stomach. Impella from right subclavian approach again seen, unchanged in position with tip projecting over the left ventricle. Right internal jugular Swan-Ganz catheter tip in the region of the main pulmonary outflow tract. Mediastinal drains and left-sided chest tube in place. Left-sided pacemaker. Median sternotomy. New midline skin staples. There is new right upper lobe collapse. Cardiomegaly is similar to prior. Vascular congestion. No large pleural effusion. Streaky retrocardiac atelectasis. IMPRESSION: 1. New right upper lobe collapse. 2. Endotracheal tube tip partially obscured but is approximately 3.6 cm from the carina. Enteric tube in place with tip and side-port below the diaphragm in the stomach. Remaining support apparatus unchanged. 3. Cardiomegaly and vascular congestion. Electronically Signed   By: Keith Rake M.D.   On: 01/07/2020 16:24   ECHO INTRAOPERATIVE TEE  Result Date: 01/14/2020  *INTRAOPERATIVE TRANSESOPHAGEAL REPORT *  [name]  Date of Exam: 01/17/2020 Medical Rec #:  [id number]  Height:       75.0 in Accession #:    [id number]     Weight:       235.7 lb Date of Birth:  1970-06-12      BSA:          2.35 m Patient Age:    34 years       BP:           108/93 mmHg Patient Gender: M              HR:           85 bpm. Exam Location:  Anesthesiology Transesophogeal exam was perform intraoperatively during surgical procedure. Patient was closely monitored under general anesthesia during the entirety of examination. Indications:     Coronary Artery Disease Sonographer:     Bernadene Person RDCS Performing Phys: Adele Barthel MD Diagnosing Phys: Adele Barthel MD Complications: No known complications during this procedure. POST-OP IMPRESSIONS - Left Ventricle: The left ventricle has severely reduced systolic function. It is slightly improved from pre-bypass. Impella in good position. - Aorta: The aorta appears unchanged from pre-bypass. - Left  Atrial Appendage: The left atrial appendage appears unchanged from pre-bypass. - Aortic Valve: The aortic valve appears unchanged from pre-bypass. - Mitral Valve: The mitral valve appears unchanged from pre-bypass. - Tricuspid Valve: There is moderate tricuspid regurgitation. - Interatrial Septum: The interatrial septum appears unchanged from pre-bypass. - Pericardium: The pericardium appears unchanged from pre-bypass. PRE-OP FINDINGS  Left Ventricle: The left ventricle has a 2D calculated ejection fraction of 15%. The cavity size was moderately dilated. There is no increase in left ventricular wall thickness. Right Ventricle: The right ventricle has moderately reduced systolic function. The cavity was normal. There is no increase in right ventricular wall thickness. Left Atrium: Left atrial size was dilated. The left atrial appendage is well visualized and there is no evidence of thrombus present. Right Atrium: Right atrial size was normal in size. Interatrial Septum: No atrial level shunt detected by color flow Doppler. Pericardium: Trivial pericardial effusion is present. Mitral Valve: The mitral valve is normal in structure. Mitral valve regurgitation is mild by color flow Doppler. Tricuspid Valve: The tricuspid valve was normal in structure. Tricuspid valve regurgitation is mild by color flow Doppler. Aortic Valve: The aortic valve is tricuspid. Aortic valve regurgitation is trivial by color flow Doppler. Impella seen crossing the aortic valve. Pulmonic Valve: The pulmonic valve was normal in structure. Pulmonic valve regurgitation is not visualized by color flow Doppler. Aorta: The aortic arch are normal in size and structure. Impella visualized in the aortic root.  Adele Barthel MD Electronically signed by Adele Barthel MD Signature Date/Time: 01/01/2020/4:31:15 PM    Final    ROS: cannot assess, comatose  Blood pressure (!) 80/57, pulse (!) 101, temperature (!) 102.56 F (39.2 C), resp. rate (!) 24,  height _0  (1.905 m), weight 122.2 kg, SpO2 98 %.  Constitutional: ill appearing man heavily sedated on vent  Eyes: pupils pinpoint, reactive, not tracking Ears, nose, mouth, and throat: ETT in place, minimal secreitons Cardiovascular: distant heart sounds, tachycardic, ext cool to touch Respiratory: mechanical breath sounds, passive on ventilator Gastrointestinal: soft, hypoactive BS Skin: cool, mottled Neurologic: GCS3 with heavy sedation Psychiatric: cannot assess  Lactate 2.7 ABG looks okay Spiking fevers Mildly elevated Cr Anemic, getting pRBCs Plts okay Mildly elevated white count  Assessment Profound shock, primarily cardiogenic vs. Obstructive suspected from RV stunning plus increased intrathoracic pressures post-CABG.  Cannot rule out  element of septic given ongoing fevers.  Total body fluid overload  Plan - [facility] ECMO on standby as salvage if patient condition does not improve after opening back up chest - Agree with broad spectrum abx, transfusions as needed - Need to work on offloading fluid once Bps can tolerate   Candee Furbish Date: 01/04/2020 Time: 7:36 AM

## 2020-01-13 NOTE — Op Note (Signed)
Procedure(s): CHEST EXPLORATION @2H12  Procedure Note  YECHEZKEL FERTIG male 49 y.o. 01/08/2020  Procedure(s) and Anesthesia Type:    * CHEST EXPLORATION @2H12  - General  Surgeon(s) and Role:    [name], MD - Primary    * Ivin Poot, MD - Assisting   Indications: The patient is s/p redo sternotomy for CABG and has hemodynamic evidence for tamponade. He is explored emergently at the bedside. Surgeon: [name]   Assistants: Prescott Gum  Anesthesia: General endotracheal anesthesia  ASA Class: 5    Procedure Detail  CHEST EXPLORATION @2H12  The chest was opened urgently after prepping, draping, and performing time out. A moderate amount of anterior mediastinal clot was encountered. The chest was copiously irrigated. The chest was left opened with a spacer between the sternal halves. Sterile dressings were applied.  Estimated Blood Loss:  less than 50 mL         Drains: as before          Blood Given: 500 CC PRBC          Specimens: none         Implants: none        Complications:  * No complications entered in OR log *         Disposition: ICU - intubated and critically ill.         Condition: stablilized, remains critical

## 2020-01-13 NOTE — Progress Notes (Signed)
Day of Surgery Procedure(s) (LRB): CHEST EXPLORATION @2H12  (N/A) Subjective: Sedated on vent Hemodynamics improved after removal of clot compressing R atrium- CVP better' RV fx adequate as assessed with chest open  Objective: Vital signs in last 24 hours: Temp:  [99.14 F (37.3 C)-102.56 F (39.2 C)] 101.66 F (38.7 C) (12/16 0845) Pulse Rate:  [49-160] 105 (12/16 0845) Cardiac Rhythm: Bundle branch block;Atrial fibrillation;Ventricular paced (12/16 0400) Resp:  [0-26] 2 (12/16 0845) BP: (80-90)/(57-65) 80/57 (12/16 0342) SpO2:  [97 %-100 %] 100 % (12/16 0845) Arterial Line BP: (59-112)/(51-79) 111/79 (12/16 0845) FiO2 (%):  [60 %-100 %] 60 % (12/16 0726) Weight:  [122.2 kg] 122.2 kg (12/16 0400)  Hemodynamic parameters for last 24 hours: PAP: (34-55)/(17-38) 45/23 CVP:  [11 mmHg-30 mmHg] 18 mmHg CO:  [4.2 L/min-6.8 L/min] 4.3 L/min CI:  [1.8 L/min/m2-3 L/min/m2] 1.9 L/min/m2  Intake/Output from previous day: 12/15 0701 - 12/16 0700 In: 9898.6 [I.V.:5795.2; Blood:1931; NG/GT:30; IV Piggyback:1975.6] Out: 2122 [Urine:1282; Chest Tube:840] Intake/Output this shift: No intake/output data recorded.  EXAM  Sedated and critically ill Sterile Ioban over the open sternum Cest tubes patent Lab Results: Recent Labs    01/08/2020 0012 01/20/2020 0020 01/23/2020 0340 01/12/2020 0533  WBC 16.8*  --  15.4*  --   HGB 8.4*   < > 7.5* 7.8*  HCT 25.2*   < > 22.3* 23.0*  PLT 201  --  192  --    < > = values in this interval not displayed.   BMET:  Recent Labs    01/24/2020 0012 01/19/2020 0020 01/17/2020 0340 01/08/2020 0533  NA 134*   < > 133* 136  K 4.2   < > 4.2 4.5  CL 102  --  102  --   CO2 20*  --  19*  --   GLUCOSE 146*  --  126*  --   BUN 24*  --  25*  --   CREATININE 1.48*  --  1.62*  --   CALCIUM 8.7*  --  8.2*  --    < > = values in this interval not displayed.    PT/INR:  Recent Labs    01/10/2020 0340  LABPROT 20.8*  INR 1.9*   ABG    Component Value Date/Time    PHART 7.349 (L) 01/09/2020 0533   HCO3 19.9 (L) 01/18/2020 0533   TCO2 21 (L) 01/05/2020 0533   ACIDBASEDEF 5.0 (H) 01/17/2020 0533   O2SAT 98.0 01/28/2020 0533   CBG (last 3)  Recent Labs    01/17/2020 0309 01/23/2020 0531 01/17/2020 0705  GLUCAP 125* 111* 119*    Assessment/Plan: S/P Procedure(s) (LRB): CHEST EXPLORATION @2H12  (N/A) Transfuse to keep Hb 9 Cont milrinone and epi for RV fx, Impella at P8   LOS: 14 days    Tharon Aquas Trigt III 12/30/2019

## 2020-01-13 NOTE — Progress Notes (Signed)
NAME:  Corey Palmer, MRN:  [id number], DOB:  [birth year], LOS: 61 ADMISSION DATE:  01/16/2020, CONSULTATION DATE: 01/07/2020 REFERRING MD: Aundra Dubin, CHIEF COMPLAINT: Respiratory failure post Impella  HPI/course in hospital  49 year old man underwent Impella 5.5 insertion 12/11 for persistent symptoms of forward failure with low cardiac indices.  He presented last week for ICD shocks and was found to be in rapid atrial fibrillation for which he was cardioverted.  He was also found to be in decompensated heart failure with an ejection fraction of 15% and multivessel coronary disease on left heart catheterization.  He was started on tropes and diuresed which improved his congestive symptoms but his index remained low so an Impella was placed to optimize his hemodynamics prior to a high risk redo-bypass.  Uneventful Impella implantation.  Patient returned from operating room intubated.  He had to go back to the OR for displacement of Impella 12/12.  Underwent CABGx4 on 12/15.  Returned to ICU on significant inotropic support and intubated.  Past Medical History   Past Medical History:  Diagnosis Date  . Anginal pain ([place])   . Anxiety   . Arthritis   . Automatic implantable cardioverter-defibrillator in situ 2007   [employer]   . Bipolar disorder ([place])   . CAD (coronary artery disease)    Cardiac cath (08/19/2000) - Nonobstructive, 40% stenosis of LAD,.   . Cancer [facility]) 2013   benign stomach cancer  . CHF (congestive heart failure) ([place])   . Chondrocalcinosis of right knee 06/11/2013  . Chronic systolic heart failure ([place])    2D echo (38/4536) - Systolic function severely reduced., LV EF 20-25%. Akinesis of apical and anteroseptal mycoardium.  last 2D echo (11/2008 ), LV EF 25-30%, diffuse hypokinesis, and grade 1 diastolic dysfunction.  . Depression   . Diabetes uncomplicated adult-type II    on insulin therapy, a1c 12.9 in 01/2011  . Dilated cardiomyopathy ([place])    status post ICD  placement in 2008 c/b tearing at the atrial junction resulting in tamponade and urgent thoracotomy  . Dyslipidemia   . Dysrhythmia   . GERD (gastroesophageal reflux disease)   . Gout, unspecified   . Hepatic steatosis 2011   seen on ultrasound.   . History of kidney stones   . Hx of echocardiogram    Echo (04/2013):  Mild LVH, EF 20-25%, mild LAE, mild to mod RVE with mild reduced RVSF.  Marland Kitchen Hypertension   . Obesity (BMI 30.0-34.9)   . Pacemaker   . Paroxysmal atrial fibrillation (H[facility])    on chronic coumadin, goal INR 2-3. status post direct current  . Peptic ulcer   . Shortness of breath   . Sleep apnea    does not have CPAP     Past Surgical History:  Procedure Laterality Date  . BIOPSY  12/01/2019   Procedure: BIOPSY;  Surgeon: Arta Silence, MD;  Location: [facility];  Service: Endoscopy;;  . CARDIAC CATHETERIZATION    . CARDIAC PACEMAKER PLACEMENT    . CHOLECYSTECTOMY    . COLONOSCOPY    . CORONARY ARTERY BYPASS GRAFT    . CORONARY ARTERY BYPASS GRAFT N/A 01/04/2020   Procedure: CORONARY ARTERY BYPASS GRAFTING (CABG), ON PUMP, TIMES FOUR, USING LEFT INTERNAL MAMMARY ARTERY AND ENDOSCOPICALLY HARVESTED RIGHT GREATER SAPHENOUS VEIN;  Surgeon: [name], MD;  Location: [place];  Service: Open Heart Surgery;  Laterality: N/A;  . ESOPHAGOGASTRODUODENOSCOPY  04/05/2011   Procedure: ESOPHAGOGASTRODUODENOSCOPY (EGD);  Surgeon: Irene Shipper, MD;  Location: [facility]  ENDOSCOPY;  Service: Endoscopy;  Laterality: N/A;  . ESOPHAGOGASTRODUODENOSCOPY (EGD) WITH PROPOFOL N/A 12/01/2019   Procedure: ESOPHAGOGASTRODUODENOSCOPY (EGD) WITH PROPOFOL;  Surgeon: Arta Silence, MD;  Location: [facility];  Service: Endoscopy;  Laterality: N/A;  . JOINT REPLACEMENT     left knee replacement  . KNEE ARTHROSCOPY Right 06/11/2013   Procedure: RIGHT KNEE ARTHROSCOPY with chondroplasty;  Surgeon: Johnny Bridge, MD;  Location: [place];  Service: Orthopedics;  Laterality: Right;  . PLACEMENT OF IMPELLA  LEFT VENTRICULAR ASSIST DEVICE N/A 01/25/2020   Procedure: PLACEMENT OF IMPELLA LEFT VENTRICULAR ASSIST DEVICE;  Surgeon: [name], MD;  Location: [place];  Service: Open Heart Surgery;  Laterality: N/A;  . PLACEMENT OF IMPELLA LEFT VENTRICULAR ASSIST DEVICE Right 01/03/2020   Procedure: AXILLARY  ARTERY PLACEMENT OF ABIOMED IMPELLA  5.5 LEFT VENTRICULAR ASSIST DEVICE;  Surgeon: [name], MD;  Location: [place];  Service: Open Heart Surgery;  Laterality: Right;  . RIGHT/LEFT HEART CATH AND CORONARY ANGIOGRAPHY N/A 01/18/2020   Procedure: RIGHT/LEFT HEART CATH AND CORONARY ANGIOGRAPHY;  Surgeon: Larey Dresser, MD;  Location: [place] CV LAB;  Service: Cardiovascular;  Laterality: N/A;  . TEE WITHOUT CARDIOVERSION N/A 01/23/2020   Procedure: TRANSESOPHAGEAL ECHOCARDIOGRAM (TEE);  Surgeon: [name], MD;  Location: [place];  Service: Open Heart Surgery;  Laterality: N/A;  . TOTAL KNEE ARTHROPLASTY Left       Interim history/subjective:  Continue to remain on multiple vasopressors/inotropes, this morning bedside chest exploration was done because of increasing vasopressor requirement and possible tamponade physiology, then he was taken back to the OR where clots were removed which were compressing the right atrium, with improvement in hemodynamics  Objective   Blood pressure (!) 80/57, pulse (!) 102, temperature (!) 100.76 F (38.2 C), resp. rate (!) 0, height 6\' 3"  (1.905 m), weight 122.2 kg, SpO2 98 %. PAP: (34-55)/(17-38) 38/19 CVP:  [10 mmHg-30 mmHg] 10 mmHg CO:  [4.2 L/min-6.8 L/min] 4.3 L/min CI:  [1.8 L/min/m2-3 L/min/m2] 1.9 L/min/m2  Vent Mode: SIMV;PSV;PRVC FiO2 (%):  [60 %-100 %] 60 % Set Rate:  [12 bmp-24 bmp] 24 bmp Vt Set:  [510 mL-640 mL] 510 mL PEEP:  [5 cmH20-7 cmH20] 5 cmH20 Pressure Support:  [10 cmH20] 10 cmH20 Plateau Pressure:  [22 cmH20-33 cmH20] 22 cmH20   Intake/Output Summary (Last 24 hours) at 01/12/2020 1046 Last data filed at 01/12/2020  1000 Gross per 24 hour  Intake 11124.64 ml  Output 2452 ml  Net 8672.64 ml   Filed Weights   01/10/20 0500 01/11/20 0630 01/21/2020 0400  Weight: 107 kg 106.9 kg 122.2 kg   PAP: (34-55)/(17-38) 38/19 CVP:  [10 mmHg-30 mmHg] 10 mmHg CO:  [4.2 L/min-6.8 L/min] 4.3 L/min CI:  [1.8 L/min/m2-3 L/min/m2] 1.9 L/min/m2  Examination: Physical exam: General: Orally intubated and sedated.  HEENT: Atraumatic, normocephalic ETT, OGT in place Neuro: Eyes closed, not following commands, sedated Chest: Median sternotomy, right subclavian Impella in place. Chest clear. Heart: Distant heart sounds. Chest tube drainage or in place  Abdomen: Soft, nontender, nondistended, bowel sounds present Skin: No rash  Ancillary tests (personally reviewed)  CBC: Recent Labs  Lab 01/27/2020 0426 01/02/2020 0921 01/25/2020 1242 12/31/2019 1307 01/08/2020 1518 01/26/2020 1536 12/30/2019 2026 12/29/2019 2028 01/28/2020 0012 01/20/2020 0020 01/26/2020 0339 01/12/2020 0340 01/11/2020 0533 01/13/20 1026  WBC 13.6*  --   --   --  29.6*  --  19.9*  --  16.8*  --   --  15.4*  --   --  HGB 12.3*   < > 8.5*   < > 9.1*   < > 6.4*   < > 8.4* 8.2* 7.1* 7.5* 7.8* 8.2*  HCT 38.7*   < > 26.8*   < > 28.0*   < > 20.8*   < > 25.2* 24.0* 21.0* 22.3* 23.0* 24.0*  MCV 82.2  --   --   --  85.1  --  85.2  --  85.4  --   --  85.1  --   --   PLT 257  --  208  --  231  --  200  --  201  --   --  192  --   --    < > = values in this interval not displayed.    Basic Metabolic Panel: Recent Labs  Lab 01/10/20 0421 01/11/20 0224 01/11/20 0843 01/25/2020 0426 01/28/2020 0921 12/30/2019 1307 01/16/2020 1316 01/08/2020 1400 01/11/2020 1536 01/10/2020 2026 01/25/2020 2028 01/05/2020 0012 01/03/2020 0020 12/29/2019 0339 01/11/2020 0340 01/19/2020 0533 01/12/2020 1026  NA 132*  --  133* 130*   < > 130*   < > 132*   < > 132*   < > 134* 135 135 133* 136 136  K 3.8  --  3.7 4.0   < > 5.6*   < > 4.7   < > 4.1   < > 4.2 4.2 4.4 4.2 4.5 4.1  CL 100  --  98 98   < > 98  --   98  --  101  --  102  --   --  102  --   --   CO2 21*  --  22 23  --   --   --   --   --  20*  --  20*  --   --  19*  --   --   GLUCOSE 113*  --  145* 152*   < > 155*  --  166*  --  146*  --  146*  --   --  126*  --   --   BUN 30*  --  22* 25*   < > 25*  --  26*  --  24*  --  24*  --   --  25*  --   --   CREATININE 1.41*  --  1.36* 1.44*   < > 1.20  --  1.30*  --  1.48*  --  1.48*  --   --  1.62*  --   --   CALCIUM 8.5*  --  8.8* 8.7*  --   --   --   --   --  9.9  --  8.7*  --   --  8.2*  --   --   MG 1.7 1.9  --  2.0  --   --   --   --   --  2.5*  --   --   --   --  2.5*  --   --    < > = values in this interval not displayed.   GFR: Estimated Creatinine Clearance: 76 mL/min (A) (by C-G formula based on SCr of 1.62 mg/dL (H)). Recent Labs  Lab 01/06/2020 1518 01/06/2020 2026 01/05/2020 0012 01/20/2020 0340 01/23/2020 0352 01/08/2020 0702  WBC 29.6* 19.9* 16.8* 15.4*  --   --   LATICACIDVEN  --   --   --   --  3.4* 2.7*    Liver Function Tests:  No results for input(s): AST, ALT, ALKPHOS, BILITOT, PROT, ALBUMIN in the last 168 hours. No results for input(s): LIPASE, AMYLASE in the last 168 hours. No results for input(s): AMMONIA in the last 168 hours.  ABG    Component Value Date/Time   PHART 7.342 (L) 01/15/2020 1026   PCO2ART 42.1 01/09/2020 1026   PO2ART 178 (H) 01/05/2020 1026   HCO3 22.5 12/31/2019 1026   TCO2 24 01/06/2020 1026   ACIDBASEDEF 3.0 (H) 12/31/2019 1026   O2SAT 99.0 01/24/2020 1026     Coagulation Profile: Recent Labs  Lab 01/24/2020 1518 01/26/2020 0340  INR 1.7* 1.9*    Cardiac Enzymes: No results for input(s): CKTOTAL, CKMB, CKMBINDEX, TROPONINI in the last 168 hours.  HbA1C: Hemoglobin A1C  Date/Time Value Ref Range Status  12/01/2019 04:04 PM 10.8 (A) 4.0 - 5.6 % Final  02/03/2019 04:24 PM 12.5 (A) 4.0 - 5.6 % Final   Hgb A1c MFr Bld  Date/Time Value Ref Range Status  01/03/2020 03:41 PM 10.6 (H) 4.8 - 5.6 % Final    Comment:    (NOTE) Pre diabetes:           5.7%-6.4%  Diabetes:              >6.4%  Glycemic control for   <7.0% adults with diabetes   10/01/2018 04:02 PM 11.9 (H) 4.8 - 5.6 % Final    Comment:    (NOTE) Pre diabetes:          5.7%-6.4% Diabetes:              >6.4% Glycemic control for   <7.0% adults with diabetes     CBG: Recent Labs  Lab 01/10/2020 0108 01/02/2020 0216 01/26/2020 0309 01/17/2020 0531 01/16/2020 0705  GLUCAP 136* 126* 125* 111* 119*    Assessment & Plan:  Acute hypoxic respiratory failure Cardiogenic shock Acute on chronic systolic heart failure requiring mechanical cardiac support with Impala and titration of inotropes Severe coronary artery disease status post CABG, complicated with cardiac tamponade, main 2 OR for reexploration Bipolar affective Diabetes type 2 Paroxysmal atrial fibrillation requiring amiodarone infusion.  Acute kidney injury-improving cardiorenal syndrome  Hyponatremia Hypomagnesemia Acute blood loss anemia   Continue lung protective ventilation with tidal volume 6 cc/kg Continue trend FiO2 and PEEP Patient continued to require multiple vasopressors and inotropes This morning his vasopressor requirement was keep going up, he had bedside chest exploration followed by he was taken to the OR and clots were removed from pericardium which were compressing the right atrium with improvement in hemodynamics Monitor chest tube output Continue Impella currently at P8 Continue amiodarone infusion Currently on epi/milrinone/norepinephrine/vasopressin Continue INO at 40 ppm Monitor H&H, transfuse to keep his hemoglobin around 8 Monitor serum creatinine and electrolytes  Daily Goals Checklist  Pain/Anxiety/Delirium protocol (if indicated): versed and fentanyl infusions.  VAP protocol (if indicated): N/A DVT prophylaxis: SCD GI prophylaxis: Protonix Glucose control: Basal and bolus insulin  Code Status: Full code Family Communication: Per primary team Disposition:  ICU    Total critical care time: 48 minutes  Performed by: [place] care time was exclusive of separately billable procedures and treating other patients.   Critical care was necessary to treat or prevent imminent or life-threatening deterioration.   Critical care was time spent personally by me on the following activities: development of treatment plan with patient and/or surrogate as well as nursing, discussions with consultants, evaluation of patient's response to treatment, examination of patient, obtaining  history from patient or surrogate, ordering and performing treatments and interventions, ordering and review of laboratory studies, ordering and review of radiographic studies, pulse oximetry and re-evaluation of patient's condition.   Jacky Kindle MD [facility] Pager: [phone] Mobile: [phone]

## 2020-01-13 NOTE — Progress Notes (Signed)
Sedated and vented patient with impella and fresh post cabg had a change in rhythm from a wide complex with a rate of 90s to  an indeterminable rhythm with a rate of 112-130s and acute drop in map from 70s low 50s., on levophed at 30, epinephrine at 10, vasopressin at 0.04 and milrinone at 0.3. Arterial line  with very poor waveform and intermittent loss of pulse pressure . Md called at this time whiles ekg was being obtained to confirm rhythm. Monitor alarms ringing vfibb/Vtach with a very wide complex. Described what was going on to Md. He advised to shock if rhythm is vfibb. At this time pulse pressure was ranging from 0 to about 10. Amiodarone bolus given per [employer].Bolusof saline and albumin given at this time. Asked to call heart failure Md. Phone connection with Md got disconnected.Dr Aundra Dubin was called and he advised to give calcium chloride and get cardiology fellow to bedside to evaluate. Dr Lynetta Mare  Came  to bedside and he ordered re bolus of amio and change pacer to DDD at 110 to override pace. Cardiology fellow came to bedside  right before Dr Roxy Manns got to bedside.  Unable to determine rhythm but Mds  agreed that it was not Vtach/vfibb.Blood pressure with maps still in the low 50s to upper 40s  Two units of PRBC given per dr Aundra Dubin. Dr Aundra Dubin at bedside to do a TEE. Dr Aundra Dubin updated wife on patient's condition

## 2020-01-13 NOTE — Procedures (Signed)
The patient earlier in the morning had his sternal wound open for drainage of hematoma compressing the right atrium A few hours later blood developed under the Esmarch dressing with drainage and evidence of returning tamponade physiology.  Under sterile prep and drape and proper timeout and incision was made in the Esmarch and clotted and nonclotted blood was removed with decompression of the mediastinum.  He has Corey Palmer was resutured with running Prolene and covered with sterile Ioban.  [name] hemodynamics improved.

## 2020-01-13 NOTE — Progress Notes (Addendum)
Patient ID: Corey Palmer, male   DOB: January 27, 1971, 48 y.o.   MRN: [id number]     Advanced Heart Failure Rounding Note  PCP-Cardiologist: No primary care provider on file.   Subjective:    - 12/7 Torsades -ICD shock x1.  - 12/11 Impella 5.5 placed.  - 12/12 Impella repositioned in OR - 12/15 CABG x 4 with LIMA-LAD, SVG-PDA/PLV, SVG-OM - 12/16 DCCV afib  Post-op shock, now on milrinone 0.25, NE 40, epinephrine 10 vasopressin 0.04, NO 40 ppm.  Impella at P7, unable to increase due to suction alarms.  TEE at bedside with EF 15-20%, Impella position optimized, severe RV dysfunction/mild RV enlargement.  Weight up 30 lbs.   In atrial fibrillation with aberrancy in 120s, on amiodarone gtt 60 mg/hr and DCCV this morning to ?NSR in 90s-100s (BiV paced).   Hgb 7.5, getting 3rd unit PRBCs.   Tm 102.5, WBCs 15.   Swan numbers: CVP 20 PA 47/24 CI 1.9 Co-ox 51%  Impella 5.5 P7 Flow 4.0 Suction at P8  Echo (12/10): EF <20%, moderate LV dilation, normal RV size with mildly decreased systolic function.   RHC/LHC: Coronary Findings   Diagnostic Dominance: Right  Left Main  30% distal left main stenosis.  Left Anterior Descending  40% ostial LAD stenosis. 90% stenosis proximal LAD just distal to D1 take-off. Large D1 with 99% proximal stenosis, 60% mid vessel stenosis.  Left Circumflex  Long up to 70% proximal LCx stenosis.  Right Coronary Artery  95% mid RCA stenosis. 75% distal RCA stenosis. 60% stenosis proximal PLV. 99% stenosis mid PDA.   Intervention   No interventions have been documented.  Right Heart  Right Heart Pressures RHC Procedural Findings: Hemodynamics (mmHg) RA mean 16 RV 53/12 PA 65/29, mean 46 PCWP mean 31 LV 119/28 AO 118/87  Oxygen saturations: PA 45% AO 93%  Cardiac Output (Fick) 3.39  Cardiac Index (Fick) 1.4 PVR 4.4 WU PAPi 2.25     Objective:   Weight Range: 122.2 kg Body mass index is 33.67 kg/m.   Vital Signs:   Temp:   [99.14 F (37.3 C)-102.56 F (39.2 C)] 102.56 F (39.2 C) (12/16 0611) Pulse Rate:  [49-160] 129 (12/16 0611) Resp:  [0-26] 24 (12/16 0611) BP: (80-90)/(57-65) 80/57 (12/16 0342) SpO2:  [90 %-100 %] 98 % (12/16 1856) Arterial Line BP: (60-112)/(51-74) 68/53 (12/16 0600) FiO2 (%):  [60 %-100 %] 60 % (12/16 0342) Weight:  [122.2 kg] 122.2 kg (12/16 0400) Last BM Date: 01/11/20  Weight change: Filed Weights   01/10/20 0500 01/11/20 0630 01/03/2020 0400  Weight: 107 kg 106.9 kg 122.2 kg    Intake/Output:   Intake/Output Summary (Last 24 hours) at 01/03/2020 0726 Last data filed at 01/01/2020 0655 Gross per 24 hour  Intake 9898.57 ml  Output 2122 ml  Net 7776.57 ml      Physical Exam   CVP 20 General: Intubated/sedated Neck: JVP 16+, no thyromegaly or thyroid nodule.  Lungs: Decreased at bases. CV: Nondisplaced PMI.  Heart tachy, regular S1/S2, no S3/S4, Impella sounds.  Trace ankle edema.   Abdomen: Soft, nontender, no hepatosplenomegaly, no distention.  Skin: Intact without lesions or rashes.  Neurologic:Sedated on vent Extremities: No clubbing or cyanosis.  HEENT: Normal.    Telemetry    ?NSR 100s with BiV pacing, personally reviewed   Labs    CBC Recent Labs    12/31/2019 0012 01/23/2020 0020 01/10/2020 0340 01/01/2020 0533  WBC 16.8*  --  15.4*  --   HGB  8.4*   < > 7.5* 7.8*  HCT 25.2*   < > 22.3* 23.0*  MCV 85.4  --  85.1  --   PLT 201  --  192  --    < > = values in this interval not displayed.   Basic Metabolic Panel Recent Labs    01/03/2020 2026 01/03/2020 2028 01/23/2020 0012 12/30/2019 0020 01/07/2020 0340 01/19/2020 0533  NA 132*   < > 134*   < > 133* 136  K 4.1   < > 4.2   < > 4.2 4.5  CL 101  --  102  --  102  --   CO2 20*  --  20*  --  19*  --   GLUCOSE 146*  --  146*  --  126*  --   BUN 24*  --  24*  --  25*  --   CREATININE 1.48*  --  1.48*  --  1.62*  --   CALCIUM 9.9  --  8.7*  --  8.2*  --   MG 2.5*  --   --   --  2.5*  --    < > = values in  this interval not displayed.   Liver Function Tests No results for input(s): AST, ALT, ALKPHOS, BILITOT, PROT, ALBUMIN in the last 72 hours. No results for input(s): LIPASE, AMYLASE in the last 72 hours. Cardiac Enzymes No results for input(s): CKTOTAL, CKMB, CKMBINDEX, TROPONINI in the last 72 hours.  BNP: BNP (last 3 results) Recent Labs    01/18/2020 1619  BNP 577.5*    ProBNP (last 3 results) No results for input(s): PROBNP in the last 8760 hours.   D-Dimer No results for input(s): DDIMER in the last 72 hours. Hemoglobin A1C No results for input(s): HGBA1C in the last 72 hours. Fasting Lipid Panel No results for input(s): CHOL, HDL, LDLCALC, TRIG, CHOLHDL, LDLDIRECT in the last 72 hours. Thyroid Function Tests No results for input(s): TSH, T4TOTAL, T3FREE, THYROIDAB in the last 72 hours.  Invalid input(s): FREET3  Other results:   Imaging    DG Chest 1 View  Result Date: 01/16/2020 CLINICAL DATA:  Post right bronchoscopy after heart surgery EXAM: CHEST  1 VIEW COMPARISON:  01/22/2020 FINDINGS: Interval re-expansion of right upper lobe atelectasis. Progression of hazy airspace disease bilaterally most likely pulmonary edema. Small pleural effusions bilaterally. Endotracheal tube in good position. Impella device in the left ventricle. Cardiac enlargement. Swan-Ganz catheter tip in the right main pulmonary artery. Right chest tube in place.  No pneumothorax. IMPRESSION: Interval re-expansion of right upper lobe atelectasis Progression of bilateral airspace disease may represent edema. Small bilateral effusions. No pneumothorax. Electronically Signed   By: Franchot Gallo M.D.   On: 01/11/2020 16:41   DG Chest Port 1 View  Result Date: 01/19/2020 CLINICAL DATA:  Post CABG. EXAM: PORTABLE CHEST 1 VIEW COMPARISON:  Radiograph yesterday. FINDINGS: Endotracheal tube tip is partially obscured but is approximately 3.6 cm from the carina. Enteric tube in place with tip and  side-port below the diaphragm in the stomach. Impella from right subclavian approach again seen, unchanged in position with tip projecting over the left ventricle. Right internal jugular Swan-Ganz catheter tip in the region of the main pulmonary outflow tract. Mediastinal drains and left-sided chest tube in place. Left-sided pacemaker. Median sternotomy. New midline skin staples. There is new right upper lobe collapse. Cardiomegaly is similar to prior. Vascular congestion. No large pleural effusion. Streaky retrocardiac atelectasis. IMPRESSION: 1. New right  upper lobe collapse. 2. Endotracheal tube tip partially obscured but is approximately 3.6 cm from the carina. Enteric tube in place with tip and side-port below the diaphragm in the stomach. Remaining support apparatus unchanged. 3. Cardiomegaly and vascular congestion. Electronically Signed   By: Keith Rake M.D.   On: 01/11/2020 16:24   ECHO INTRAOPERATIVE TEE  Result Date: 01/15/2020  *INTRAOPERATIVE TRANSESOPHAGEAL REPORT *  Patient Name:   DEFOREST MAIDEN  Date of Exam: 01/06/2020 Medical Rec #:  [id number]      Height:       75.0 in Accession #:    [id number]     Weight:       235.7 lb Date of Birth:  July 27, 1970      BSA:          2.35 m Patient Age:    30 years       BP:           108/93 mmHg Patient Gender: M              HR:           85 bpm. Exam Location:  Anesthesiology Transesophogeal exam was perform intraoperatively during surgical procedure. Patient was closely monitored under general anesthesia during the entirety of examination. Indications:     Coronary Artery Disease Sonographer:     Bernadene Person RDCS Performing Phys: Adele Barthel MD Diagnosing Phys: Adele Barthel MD Complications: No known complications during this procedure. POST-OP IMPRESSIONS - Left Ventricle: The left ventricle has severely reduced systolic function. It is slightly improved from pre-bypass. Impella in good position. - Aorta: The aorta appears unchanged from  pre-bypass. - Left Atrial Appendage: The left atrial appendage appears unchanged from pre-bypass. - Aortic Valve: The aortic valve appears unchanged from pre-bypass. - Mitral Valve: The mitral valve appears unchanged from pre-bypass. - Tricuspid Valve: There is moderate tricuspid regurgitation. - Interatrial Septum: The interatrial septum appears unchanged from pre-bypass. - Pericardium: The pericardium appears unchanged from pre-bypass. PRE-OP FINDINGS  Left Ventricle: The left ventricle has a 2D calculated ejection fraction of 15%. The cavity size was moderately dilated. There is no increase in left ventricular wall thickness. Right Ventricle: The right ventricle has moderately reduced systolic function. The cavity was normal. There is no increase in right ventricular wall thickness. Left Atrium: Left atrial size was dilated. The left atrial appendage is well visualized and there is no evidence of thrombus present. Right Atrium: Right atrial size was normal in size. Interatrial Septum: No atrial level shunt detected by color flow Doppler. Pericardium: Trivial pericardial effusion is present. Mitral Valve: The mitral valve is normal in structure. Mitral valve regurgitation is mild by color flow Doppler. Tricuspid Valve: The tricuspid valve was normal in structure. Tricuspid valve regurgitation is mild by color flow Doppler. Aortic Valve: The aortic valve is tricuspid. Aortic valve regurgitation is trivial by color flow Doppler. Impella seen crossing the aortic valve. Pulmonic Valve: The pulmonic valve was normal in structure. Pulmonic valve regurgitation is not visualized by color flow Doppler. Aorta: The aortic arch are normal in size and structure. Impella visualized in the aortic root.  Adele Barthel MD Electronically signed by Adele Barthel MD Signature Date/Time: 01/26/2020/4:31:15 PM    Final      Medications:     Scheduled Medications: . acetaminophen  1,000 mg Oral Q6H   Or  . acetaminophen  (TYLENOL) oral liquid 160 mg/5 mL  1,000 mg Per Tube Q6H  . artificial tears  1  application Both Eyes Y7C  . aspirin EC  325 mg Oral Daily   Or  . aspirin  324 mg Per Tube Daily  . atorvastatin  80 mg Oral Daily  . bisacodyl  10 mg Oral Daily   Or  . bisacodyl  10 mg Rectal Daily  . busPIRone  10 mg Oral TID  . chlorhexidine  15 mL Mouth/Throat NOW  . chlorhexidine gluconate (MEDLINE KIT)  15 mL Mouth Rinse BID  . Chlorhexidine Gluconate Cloth  6 each Topical Daily  . divalproex  500 mg Oral QHS  . docusate  100 mg Per Tube BID  . fentaNYL (SUBLIMAZE) injection  25 mcg Intravenous Once  . FLUoxetine  40 mg Oral BID  . mouth rinse  15 mL Mouth Rinse 10 times per day  . [START ON 01/14/2020] pantoprazole  40 mg Oral Daily  . polyethylene glycol  17 g Per Tube Daily  . sodium chloride flush  10-40 mL Intracatheter Q12H  . sodium chloride flush  3 mL Intravenous Q12H    Infusions: . sodium chloride Stopped (01/15/2020 1646)  . sodium chloride    . sodium chloride 10 mL/hr at 01/15/2020 1517  . sodium chloride 10 mL/hr at 01/18/2020 1900  . albumin human 12.5 g (01/27/2020 0119)  . amiodarone 60 mg/hr (01/26/2020 0536)  . amiodarone    . electrolyte-A 75 mL/hr at 01/23/2020 0538  . epinephrine 10 mcg/min (01/26/2020 0306)  . fentaNYL infusion INTRAVENOUS 100 mcg/hr (12/30/2019 2248)  . impella catheter heparin 50 unit/mL in dextrose 5%    . insulin 2.4 Units/hr (01/03/2020 0709)  . lactated ringers    . lactated ringers    . lactated ringers 50 mL/hr at 01/15/2020 1623  . levofloxacin (LEVAQUIN) IV    . midazolam 2 mg/hr (01/27/2020 2312)  . milrinone Stopped (01/16/2020 0200)  . milrinone 0.25 mcg/kg/min (12/30/2019 0200)  . nitroGLYCERIN    . norepinephrine (LEVOPHED) Adult infusion 40 mcg/min (01/07/2020 0043)  . vasopressin 0.04 Units/min (01/11/2020 0537)    PRN Medications: sodium chloride, sodium chloride, albumin human, dextrose, fentaNYL, fentaNYL (SUBLIMAZE) injection, fentaNYL (SUBLIMAZE)  injection, lactated ringers, metoprolol tartrate, midazolam, midazolam, midazolam, ondansetron (ZOFRAN) IV, oxyCODONE, sodium chloride flush, sodium chloride flush, vecuronium     Assessment/Plan   1. Atrial fibrillation: H/o PAF.  He was on dofetilide in the remote past but this was stopped due to noncompliance. He had an upper GI bleed from antral ulcers in 3/12. He was seen by GI and was deemed safe to restart anticoagulation as long as he remains on a PPI.  No apparent recurrence of AF until just prior to this admission, was cardioverted back to NSR in ER but went back into atrial fibrillation.  Post-op appeared afib with aberrancy, DCCV to NSR/BiV pacing am 12/15.  - Continue amiodarone gtt 60 mg/hr for now.  - Off heparin post-op - Unable to do Maze with CABG.   2. CAD: Nonobstructive mild CAD on remote cath in 2002. He has had chest pain but is pleuritic.  HS-TnI 64 => 92, suspect demand ischemia with volume overload and afib/RVR.   Cath this admission with severe 3 vessel disease, ideally treated by CABG given extent of disease. CABG x 4 on 12/15.  - Continue atorvastatin, ASA.  3. Cardiogenic shock/acute on chronic systolic CHF: Initially nonischemic cardiomyopathy. Possible familial cardiomyopathy as both parents had cardiomyopathy and died at around 73.However, [employer] gene testing did not show any common mutation for cardiomyopathy.  However, this  admission noted to have severe 3 vessel disease so suspect component of ischemic cardiomyopathy. [employer] ICD. Echo in 8/20 with EF 15% and mildly decreased RV function. Echo this admission with EF < 20%, moderate LV dilation, RV mildly reduced, severe LAE, no significant MR.Low output HF with markedly low EF.  He has a long history of cardiomyopathy (20 yrs), tends to minimize symptoms.  Cardiorenal syndrome with creatinine up to 2.2,  stabilized on milrinone and Impella 5.5. SCr 1.44 day of CABG. CABG 12/15.  Post-op shock on milrinone 0.25,  epinephrine 10, NE 40, vasopressin 0.04, NO 40 ppm. Impella stable at P7, suction at P8.  On purge heparin.  TEE post-op with severe RV dysfunction. Weight up 30 lbs with CVP 20. CI 1.9 off Swan with co-ox 51%, not acidotic on ABG (7.35).  - Discussed with Dr. [name], concerned about RV dysfunction and possible chest compartment syndrome, plan to open chest at bedside.  If this does not help the situation, [facility] will be bail-out.  - Continue Impella P7 for now.  - Continue current pressor/inotrope support.  - Will need diuresis when stasbilized.  4. Acute on chronic hypoxemic respiratory failure: Pulmonary edema, vent per CCM.  5. AKI on CKD stage ?3: Creatinine mildly higher this morning at 1.6.     6. Diabetes: Insulin.  7. Hyponatremia: Hypervolemic hyponatremia: Fluid restrict.  8. Torsades/ICD shock: 12/7/212 in hospital event, in setting of severe hypokalemia and hypomagnesemia.   9. Gout: h/o gout. Complains of rt knee pain c/w previous flares - treated w/ prednisone burst (completed).  10: ID: Tm 102.5 overnight.  - Culture + obtain PCT.  - Cover with vancomycin/meropenem (PCN allergy).   CRITICAL CARE Performed by: Loralie Champagne  Total critical care time: 45 minutes  Critical care time was exclusive of separately billable procedures and treating other patients.  Critical care was necessary to treat or prevent imminent or life-threatening deterioration.  Critical care was time spent personally by me on the following activities: development of treatment plan with patient and/or surrogate as well as nursing, discussions with consultants, evaluation of patient's response to treatment, examination of patient, obtaining history from patient or surrogate, ordering and performing treatments and interventions, ordering and review of laboratory studies, ordering and review of radiographic studies, pulse oximetry and re-evaluation of patient's condition.   Loralie Champagne 01/24/2020 7:26  AM

## 2020-01-13 NOTE — Transfer of Care (Signed)
Immediate Anesthesia Transfer of Care Note  Patient: Corey Palmer  Procedure(s) Performed: RE-EXPLORATION POST OPERATIVE OPEN HEART (N/A Chest) TRANSESOPHAGEAL ECHOCARDIOGRAM (TEE) (N/A )  Patient Location: ICU  Anesthesia Type:General  Level of Consciousness: unresponsive and Patient remains intubated per anesthesia plan  Airway & Oxygen Therapy: Patient remains intubated per anesthesia plan and Patient placed on Ventilator (see vital sign flow sheet for setting)  Post-op Assessment: Report given to RN and Post -op Vital signs reviewed and stable  Post vital signs: Reviewed and stable  Last Vitals:  Vitals Value Taken Time  BP    Temp 37.6 C 01/06/2020 1628  Pulse 124 01/07/2020 1628  Resp 14 01/22/2020 1628  SpO2 100 % 01/20/2020 1628  Vitals shown include unvalidated device data.  Last Pain:  Vitals:   01/21/2020 1254  TempSrc: Core  PainSc:       Patients Stated Pain Goal: 1 (15/52/08 0223)  Complications: No complications documented.

## 2020-01-13 NOTE — Anesthesia Postprocedure Evaluation (Signed)
Anesthesia Post Note  Patient: Corey Palmer  Procedure(s) Performed: RE-EXPLORATION POST OPERATIVE OPEN HEART (N/A Chest) TRANSESOPHAGEAL ECHOCARDIOGRAM (TEE) (N/A )     Patient location during evaluation: SICU Anesthesia Type: General Level of consciousness: sedated Pain management: pain level controlled Vital Signs Assessment: post-procedure vital signs reviewed and stable Respiratory status: patient remains intubated per anesthesia plan Cardiovascular status: stable Postop Assessment: no apparent nausea or vomiting Anesthetic complications: no   No complications documented.  Last Vitals:  Vitals:   01/05/2020 1254 01/04/2020 1628  BP: 96/64   Pulse: (!) 105 (!) 124  Resp:  12  Temp: 37.7 C 37.6 C  SpO2: 100% 100%    Last Pain:  Vitals:   01/28/2020 1712  TempSrc: Core  PainSc:                  Samatha Anspach DANIEL

## 2020-01-13 NOTE — Brief Op Note (Signed)
01/25/2020  4:15 PM  PATIENT:  Corey Palmer  49 y.o. male  PRE-OPERATIVE DIAGNOSIS:  BLEEDING S/P CABG  POST-OPERATIVE DIAGNOSIS:  BLEEDING S/P CABG  PROCEDURE:  Procedure(s): RE-EXPLORATION POST OPERATIVE OPEN HEART (N/A) TRANSESOPHAGEAL ECHOCARDIOGRAM (TEE) (N/A)  SURGEON:  Surgeon(s) and Role:    * [name], MD - Primary  PHYSICIAN ASSISTANT: n/a ASSISTANTS: staff   ANESTHESIA:   general  EBL:  500 mL   BLOOD ADMINISTERED:none  DRAINS: 4 chest tubes in bilateral pleural spaces and mediastinum   LOCAL MEDICATIONS USED:  NONE  SPECIMEN:  No Specimen  DISPOSITION OF SPECIMEN:  N/A  COUNTS:  YES  TOURNIQUET:  * No tourniquets in log *  DICTATION: .Note written in EPIC  PLAN OF CARE: Admit to inpatient   PATIENT DISPOSITION:  ICU - intubated and critically ill.   Delay start of Pharmacological VTE agent (>24hrs) due to surgical blood loss or risk of bleeding: yes

## 2020-01-13 NOTE — Progress Notes (Addendum)
Pt transported from OR to [facility] on [facility]. No complications noted.

## 2020-01-13 NOTE — Progress Notes (Signed)
CT surgery p.m. rounds  Patient more stable after return to the OR for mediastinal washout and bleeding Chest tube output proxy 100 cc/h Recheck CBC at 11 PM Urine output 30 to 40 cc/h on Lasix drip Blood pressure 96/64, pulse (!) 124, temperature 99.14 F (37.3 C), resp. rate (!) 0, height 6\' 3"  (1.905 m), weight 122.2 kg, SpO2 100 %.

## 2020-01-13 NOTE — Hospital Course (Addendum)
Admitting Diagnoses: Multivessel coronary artery disease Acute on chronic systolic congestive heart failure Atrial fibrillation Chronic kidney disease Type 2 diabetes mellitus, poorly controlled (hemoglobin A1c 10.6)  Discharge Diagnoses:  Multivessel coronary artery disease Acute on chronic systolic congestive heart failure Atrial fibrillation Acute on Chronic kidney disease progressing to ESRD Type 2 diabetes mellitus, poorly controlled  Post-operative coagulopathy Pericardial tamponade Expected acute blood loss anemia Open sternal wound Post operative atrial fibrillation with RVR Post-operative ventricular tachycardia Bilateral pleural effusions Stage II sacral pressure ulcer Septic shock Acute on chronic hypoxic respiratory failure          History of Present Illness:   At time of admission: Corey Palmer is a 49 year old male with a history of nonischemic cardiomyopathy and paroxysmal atrial fibrillation.  He has had a cardiomyopathy since the early 2000's.  He has a Environmental health practitioner ICD.  He had an echo from [facility] 09/17/2018 that showed an ejection fraction of approximately 15%.  He is on Eliquis for his atrial fibrillation.  He also has type II DM and is on insulin.  He has sleep apnea.   The patient was feeling well yesterday.  This morning, he feels that he had shocks from his ICD.  After his perceived second ICD shock, the patient complained of chest pain.  The chest pain was 10 out of 10 in the emergency room.  Review of his device interrogation shows no ICD shocks.  He is in atrial fibrillation.  Per the emergency room physician, the patient has been compliant with his anticoagulation.  He did receive cardioversion in the emergency room to sinus tachycardia.   Hospital course: The patient was admitted through the emergency department for further evaluation and treatment including full cardiology evaluation.  The advanced heart failure team was consulted to assist  with management due to his significant reduction in ejection fraction with acute decompensation.  He was started on a course of aggressive diuretics use initially with a Lasix drip.  He was also cardioverted back to normal sinus rhythm in the emergency department.  It was noted that his troponins were elevated.  He was also started on milrinone and amiodarone drips.  Echocardiogram showed EF to be less than 20%, restrictive diastolic function, relatively normal right ventricle.  Ultimately it was determined that he should undergo right and left heart catheterization which was done on January 05, 2020.  This revealed severe three-vessel coronary artery disease also and cardiothoracic surgical consultation was obtained with Dr. [name].  High risk CABG was recommended with preoperative Impella placement.  He does continue to be monitored closely and medications adjusted to maximize therapy prior to proceeding.  On January 12, 2020 he was taken to the operating room where he underwent CABG x4.  He has required significant postoperative inotropic and vasopressor support as well as transfusion of blood and multiple products.  In the morning on January 13, 2020 he developed tamponade physiology and his chest was opened in the intensive care unit for relief of this.  Later in the day he did return to the operating room where he underwent further management of postoperative bleeding including evacuation of hematoma copious irrigation.  There was a demonstrated bleeding site on the right aspect of the hemithorax which was controlled as well.  He returned to the SICU in stable condition.  He does continue to require significant inotropic support as well as inhaled nitric oxide and the Impella device.  Over the next 2 days the patient developed increasing acute  kidney injury and he is being started on CRRT with nephrology consultation assisting with management of all renal issues. The advanced heart failure team is also  assisting with management. He also developed postoperative atrial fibrillation and underwent DCCV with initial return to NSR/biventricular pacing but then reverted back in atrial fibrillation. He was treated with amiodarone drip. A right femoral hemodialysis catheter was placed and ultrafiltration treatment was initiated for removal of volume excess. This was managed by the nephrology team. He continued to require mechanical ventilatory support. He responded to the ultrafiltration treatment and had significant reduction is tissue edema. He was returned to the OR on post-op day 9 for closure of the open sternal wound by Dr. Darcey Nora.  He tolerated this well over night following the procedure but then on the following day he had steady decline in his hemodynamics requiring increase pharmacological support. For this reason, his chest was re-opened urgently at the bedside by Dr. [name] on post-op day 10. Hemodynamics improved. He continued to be supported with mechanical ventilation, an Impella ventricular assist device, nitric oxide, as well as vasopressor and inotropic infusions. He was followed closely by the critical and advanced heart failure teams. E was returned to the OR on post-op day 13 for mediastinal wound washout and possible application of a wound vac.  1/3/2022week continue to be assisted in management the nephrology team, the critical care team and the advanced heart failure team.  CRRT started 01/16/2020 continues.  They are continuing to attempt to keep even, hoping to start gentle ultrafiltration sometime this week.  He remains on pressor support including Levophed and epinephrine as well as milrinone and the Impella device.  He is continued to remain in normal sinus rhythm after previous earlier atrial fibrillation and underwent DCCV on 01/17/2020.  He does remain on amiodarone drip for this. He Continues to require mechanical ventilation.  On 02/04/2020 he continues CRRT significantly positive.   Attempted chest closure on 02/09/2020 had to be reopened due to anterior chest hematoma development.  He continues to be on multiple pressors including Levophed, epinephrine and vasopressin.  He is also on milrinone and midodrine.  He is Impella remains at P8.  He remained on full ventilator support and inotropic support for several more days.  On 02/28/2020, he was returned to the operating room for sternal washout and wound VAC change.  At that time a tracheostomy was also placed along with an 10 French gastrostomy tube and a 16 French jejunostomy feeding tube.  He continued to require ventilator and inotropic support along with the Impella.Marland Kitchen  He was followed closely by the heart failure team.  He was seen in consultation by Dr. Marla Roe, plastic surgery.  The patient was returned to the operating room on 02/06/2020 by Dr. Marla Roe for partial closure of the sternal chest wound, placement of ACell powder, placement of a wound VAC, and placement of ABRA device.  Hemodynamics gradually improved allowing for removal of the Impella device on 02/17/2020.  Supportive care continued.  He was returned to the operative room on 02/08/2020 by plastic surgery for excisional debridement of the sternal wound, evacuation of hematoma, placement of ACell powder and placement of wound VAC.  He tolerated this well and remained stable.  He continued to require support of the mechanical ventilation.  Spontaneous breathing trials were permitted.  He was again taken back to the operating room on 03/07/2020 by plastic surgery.  He underwent repeat wound debridement with placement of Myriad power, Acell powder and sheet,  and replacement of wound vac.  He was taken back to the ICU in stable condition.  The patient was restarted on a Heparin drip.  He developed bleeding from his wound vac dressing.  This ultimately started to collect under his sternal dressing.  He required return to the OR on 03/03/2020 for evacuation of hematoma.  Since return  from OR the patient's MAP decreased and he required resumption of Levophed.  He remained on Dobutamine.  The patient was not making much progress.  A family meeting was help with palliative care at which point the patient was made a DNR.  It was felt the patient would require toleration of intermitted hemodialysis for any chance of recovery.  However prior to this it was noticed the patient was not moving his right side.  CT of the head was obtained and showed an acute infarct involving portions of the left posterior temporal and medial to mid left occipital lobe as well as the posterior limb of the left internal capsule and much of the mid to lateral left thalamus.  Neurology consult was obtained and they felt further workup with CTA head and neck in addition to MRI brain.  They also recommended continuation of heparin for further prevention of current stroke.  The patient developed fever overnight with spike in white count.  He was treated with antibiotics.  He required treatment with [facility] for a potassium level of 5.7.  He has required increased pressor support.  It was felt due to the patient's recent stroke, increased pressor support and non-healing sternal wound that a chance for meaningful recovery was not likely.  Palliative care aided in discussions and it was felt patient should be made comfort care on 03-28-20.  The patient passed away on 03/29/20.

## 2020-01-14 ENCOUNTER — Encounter (HOSPITAL_COMMUNITY): Payer: Self-pay | Admitting: Cardiothoracic Surgery

## 2020-01-14 ENCOUNTER — Inpatient Hospital Stay (HOSPITAL_COMMUNITY): Payer: BC Managed Care – PPO

## 2020-01-14 DIAGNOSIS — Z95811 Presence of heart assist device: Secondary | ICD-10-CM

## 2020-01-14 LAB — PREPARE FRESH FROZEN PLASMA
Unit division: 0
Unit division: 0
Unit division: 0

## 2020-01-14 LAB — GLUCOSE, CAPILLARY
Glucose-Capillary: 102 mg/dL — ABNORMAL HIGH (ref 70–99)
Glucose-Capillary: 110 mg/dL — ABNORMAL HIGH (ref 70–99)
Glucose-Capillary: 113 mg/dL — ABNORMAL HIGH (ref 70–99)
Glucose-Capillary: 115 mg/dL — ABNORMAL HIGH (ref 70–99)
Glucose-Capillary: 117 mg/dL — ABNORMAL HIGH (ref 70–99)
Glucose-Capillary: 119 mg/dL — ABNORMAL HIGH (ref 70–99)
Glucose-Capillary: 120 mg/dL — ABNORMAL HIGH (ref 70–99)
Glucose-Capillary: 120 mg/dL — ABNORMAL HIGH (ref 70–99)
Glucose-Capillary: 123 mg/dL — ABNORMAL HIGH (ref 70–99)
Glucose-Capillary: 126 mg/dL — ABNORMAL HIGH (ref 70–99)
Glucose-Capillary: 128 mg/dL — ABNORMAL HIGH (ref 70–99)
Glucose-Capillary: 134 mg/dL — ABNORMAL HIGH (ref 70–99)
Glucose-Capillary: 135 mg/dL — ABNORMAL HIGH (ref 70–99)
Glucose-Capillary: 146 mg/dL — ABNORMAL HIGH (ref 70–99)
Glucose-Capillary: 148 mg/dL — ABNORMAL HIGH (ref 70–99)

## 2020-01-14 LAB — POCT I-STAT 7, (LYTES, BLD GAS, ICA,H+H)
Acid-base deficit: 3 mmol/L — ABNORMAL HIGH (ref 0.0–2.0)
Acid-base deficit: 2 mmol/L (ref 0.0–2.0)
Bicarbonate: 23.4 mmol/L (ref 20.0–28.0)
Bicarbonate: 24.1 mmol/L (ref 20.0–28.0)
Calcium, Ion: 1.22 mmol/L (ref 1.15–1.40)
Calcium, Ion: 1.26 mmol/L (ref 1.15–1.40)
HCT: 46 % (ref 39.0–52.0)
HCT: 27 % — ABNORMAL LOW (ref 39.0–52.0)
Hemoglobin: 15.6 g/dL (ref 13.0–17.0)
Hemoglobin: 9.2 g/dL — ABNORMAL LOW (ref 13.0–17.0)
O2 Saturation: 99 %
O2 Saturation: 98 %
Patient temperature: 37.6
Patient temperature: 37.7
Potassium: 4.1 mmol/L (ref 3.5–5.1)
Potassium: 3.9 mmol/L (ref 3.5–5.1)
Sodium: 136 mmol/L (ref 135–145)
Sodium: 137 mmol/L (ref 135–145)
TCO2: 25 mmol/L (ref 22–32)
TCO2: 25 mmol/L (ref 22–32)
pCO2 arterial: 46.3 mmHg (ref 32.0–48.0)
pCO2 arterial: 44.9 mmHg (ref 32.0–48.0)
pH, Arterial: 7.315 — ABNORMAL LOW (ref 7.350–7.450)
pH, Arterial: 7.341 — ABNORMAL LOW (ref 7.350–7.450)
pO2, Arterial: 151 mmHg — ABNORMAL HIGH (ref 83.0–108.0)
pO2, Arterial: 124 mmHg — ABNORMAL HIGH (ref 83.0–108.0)

## 2020-01-14 LAB — HEPARIN LEVEL (UNFRACTIONATED): Heparin Unfractionated: <0.1 IU/mL — ABNORMAL LOW (ref 0.30–0.70)

## 2020-01-14 LAB — BASIC METABOLIC PANEL
Anion gap: 10 (ref 5–15)
BUN: 25 mg/dL — ABNORMAL HIGH (ref 6–20)
CO2: 22 mmol/L (ref 22–32)
Calcium: 8.2 mg/dL — ABNORMAL LOW (ref 8.9–10.3)
Chloride: 104 mmol/L (ref 98–111)
Creatinine, Ser: 1.61 mg/dL — ABNORMAL HIGH (ref 0.61–1.24)
GFR, Estimated: 51 mL/min — ABNORMAL LOW (ref >60)
Glucose, Bld: 110 mg/dL — ABNORMAL HIGH (ref 70–99)
Potassium: 4.2 mmol/L (ref 3.5–5.1)
Sodium: 136 mmol/L (ref 135–145)

## 2020-01-14 LAB — BPAM CRYOPRECIPITATE
Blood Product Expiration Date: 202112161635
Blood Product Expiration Date: 202112161635
Blood Product Expiration Date: 202112162148
ISSUE DATE / TIME: 202112161117
ISSUE DATE / TIME: 202112161117
ISSUE DATE / TIME: 202112161649
Unit Type and Rh: 5100
Unit Type and Rh: 5100
Unit Type and Rh: 6200

## 2020-01-14 LAB — BPAM FFP
Blood Product Expiration Date: 202112202359
Blood Product Expiration Date: 202112202359
Blood Product Expiration Date: 202112202359
Blood Product Expiration Date: 202112212359
Blood Product Expiration Date: 202112212359
Blood Product Expiration Date: 202112212359
Blood Product Expiration Date: 202112212359
ISSUE DATE / TIME: 202112151704
ISSUE DATE / TIME: 202112160808
ISSUE DATE / TIME: 202112160808
ISSUE DATE / TIME: 202112161038
ISSUE DATE / TIME: 202112161038
ISSUE DATE / TIME: 202112161651
ISSUE DATE / TIME: 202112161651
Unit Type and Rh: 6200
Unit Type and Rh: 6200
Unit Type and Rh: 6200
Unit Type and Rh: 6200
Unit Type and Rh: 6200
Unit Type and Rh: 6200
Unit Type and Rh: 6200

## 2020-01-14 LAB — CBC
HCT: 24.4 % — ABNORMAL LOW (ref 39.0–52.0)
HCT: 27.6 % — ABNORMAL LOW (ref 39.0–52.0)
Hemoglobin: 8.4 g/dL — ABNORMAL LOW (ref 13.0–17.0)
Hemoglobin: 9.4 g/dL — ABNORMAL LOW (ref 13.0–17.0)
MCH: 29 pg (ref 26.0–34.0)
MCH: 28.7 pg (ref 26.0–34.0)
MCHC: 34.4 g/dL (ref 30.0–36.0)
MCHC: 34.1 g/dL (ref 30.0–36.0)
MCV: 84.1 fL (ref 80.0–100.0)
MCV: 84.1 fL (ref 80.0–100.0)
Platelets: 150 10*3/uL (ref 150–400)
Platelets: 152 10*3/uL (ref 150–400)
RBC: 2.9 MIL/uL — ABNORMAL LOW (ref 4.22–5.81)
RBC: 3.28 MIL/uL — ABNORMAL LOW (ref 4.22–5.81)
RDW: 16.6 % — ABNORMAL HIGH (ref 11.5–15.5)
RDW: 16.4 % — ABNORMAL HIGH (ref 11.5–15.5)
WBC: 17 10*3/uL — ABNORMAL HIGH (ref 4.0–10.5)
WBC: 18.4 10*3/uL — ABNORMAL HIGH (ref 4.0–10.5)
nRBC: 0 % (ref 0.0–0.2)
nRBC: 0 % (ref 0.0–0.2)

## 2020-01-14 LAB — PREPARE PLATELET PHERESIS
Unit division: 0
Unit division: 0

## 2020-01-14 LAB — COOXEMETRY PANEL
Carboxyhemoglobin: 1 % (ref 0.5–1.5)
Methemoglobin: 1.3 % (ref 0.0–1.5)
O2 Saturation: 52.5 %
Total hemoglobin: 10.2 g/dL — ABNORMAL LOW (ref 12.0–16.0)

## 2020-01-14 LAB — ECHOCARDIOGRAM LIMITED
Height: 75 in
Weight: 4409.2 oz

## 2020-01-14 LAB — PREPARE CRYOPRECIPITATE
Unit division: 0
Unit division: 0
Unit division: 0

## 2020-01-14 LAB — BPAM PLATELET PHERESIS
Blood Product Expiration Date: 202112172359
Blood Product Expiration Date: 202112182359
ISSUE DATE / TIME: 202112161649
ISSUE DATE / TIME: 202112161649
Unit Type and Rh: 6200
Unit Type and Rh: 6200

## 2020-01-14 LAB — COMPREHENSIVE METABOLIC PANEL
ALT: 171 U/L — ABNORMAL HIGH (ref 0–44)
AST: 264 U/L — ABNORMAL HIGH (ref 15–41)
Albumin: 2.3 g/dL — ABNORMAL LOW (ref 3.5–5.0)
Alkaline Phosphatase: 88 U/L (ref 38–126)
Anion gap: 9 (ref 5–15)
BUN: 27 mg/dL — ABNORMAL HIGH (ref 6–20)
CO2: 23 mmol/L (ref 22–32)
Calcium: 8.2 mg/dL — ABNORMAL LOW (ref 8.9–10.3)
Chloride: 104 mmol/L (ref 98–111)
Creatinine, Ser: 1.7 mg/dL — ABNORMAL HIGH (ref 0.61–1.24)
GFR, Estimated: 48 mL/min — ABNORMAL LOW (ref >60)
Glucose, Bld: 121 mg/dL — ABNORMAL HIGH (ref 70–99)
Potassium: 3.9 mmol/L (ref 3.5–5.1)
Sodium: 136 mmol/L (ref 135–145)
Total Bilirubin: 1.6 mg/dL — ABNORMAL HIGH (ref 0.3–1.2)
Total Protein: 4.7 g/dL — ABNORMAL LOW (ref 6.5–8.1)

## 2020-01-14 LAB — MAGNESIUM: Magnesium: 1.8 mg/dL (ref 1.7–2.4)

## 2020-01-14 LAB — PROCALCITONIN: Procalcitonin: 13.46 ng/mL

## 2020-01-14 LAB — LACTIC ACID, PLASMA
Lactic Acid, Venous: 1.2 mmol/L (ref 0.5–1.9)
Lactic Acid, Venous: 1.3 mmol/L (ref 0.5–1.9)

## 2020-01-14 LAB — LACTATE DEHYDROGENASE: LDH: 412 U/L — ABNORMAL HIGH (ref 98–192)

## 2020-01-14 LAB — PREPARE RBC (CROSSMATCH)

## 2020-01-14 LAB — PHOSPHORUS: Phosphorus: 4.3 mg/dL (ref 2.5–4.6)

## 2020-01-14 MED ORDER — PIVOT 1.5 CAL PO LIQD
1000.0000 mL | ORAL | Status: DC
  Administered 2020-01-14 – 2020-01-20 (×9): 1000 mL
  Filled 2020-01-14 (×2): qty 1000

## 2020-01-14 MED ORDER — AMIODARONE LOAD VIA INFUSION
150.0000 mg | Freq: Once | INTRAVENOUS | Status: AC
  Administered 2020-01-14: 05:00:00 150 mg via INTRAVENOUS

## 2020-01-14 MED ORDER — FLUOXETINE HCL 20 MG/5ML PO SOLN
40.0000 mg | Freq: Two times a day (BID) | ORAL | Status: DC
  Administered 2020-01-14 – 2020-03-07 (×105): 40 mg
  Filled 2020-01-14 (×113): qty 10

## 2020-01-14 MED ORDER — ASPIRIN 300 MG RE SUPP
300.0000 mg | Freq: Every day | RECTAL | Status: DC
  Administered 2020-01-14 – 2020-01-16 (×3): 300 mg via RECTAL
  Filled 2020-01-14 (×3): qty 1

## 2020-01-14 MED ORDER — METOLAZONE 2.5 MG PO TABS
2.5000 mg | ORAL_TABLET | Freq: Once | ORAL | Status: AC
  Administered 2020-01-14: 08:00:00 2.5 mg
  Filled 2020-01-14: qty 1

## 2020-01-14 MED ORDER — CHLORHEXIDINE GLUCONATE CLOTH 2 % EX PADS
6.0000 | MEDICATED_PAD | Freq: Every day | CUTANEOUS | Status: DC
  Administered 2020-01-15 – 2020-03-07 (×49): 6 via TOPICAL

## 2020-01-14 MED ORDER — SODIUM CHLORIDE 0.9% IV SOLUTION: Freq: Once | INTRAVENOUS | Status: AC

## 2020-01-14 MED ORDER — HEPARIN SODIUM (PORCINE) 5000 UNIT/ML IJ SOLN
INTRAVENOUS | Status: DC
  Filled 2020-01-14 (×14): qty 10

## 2020-01-14 MED FILL — Magnesium Sulfate Inj 50%: INTRAMUSCULAR | Qty: 10 | Status: AC

## 2020-01-14 MED FILL — Sodium Chloride IV Soln 0.9%: INTRAVENOUS | Qty: 2000 | Status: AC

## 2020-01-14 MED FILL — Lidocaine HCl Local Soln Prefilled Syringe 100 MG/5ML (2%): INTRAMUSCULAR | Qty: 5 | Status: AC

## 2020-01-14 MED FILL — Mannitol IV Soln 20%: INTRAVENOUS | Qty: 500 | Status: AC

## 2020-01-14 MED FILL — Electrolyte-R (PH 7.4) Solution: INTRAVENOUS | Qty: 3000 | Status: AC

## 2020-01-14 MED FILL — Heparin Sodium (Porcine) Inj 1000 Unit/ML: INTRAMUSCULAR | Qty: 30 | Status: AC

## 2020-01-14 MED FILL — Sodium Bicarbonate IV Soln 8.4%: INTRAVENOUS | Qty: 50 | Status: AC

## 2020-01-14 MED FILL — Potassium Chloride Inj 2 mEq/ML: INTRAVENOUS | Qty: 40 | Status: AC

## 2020-01-14 MED FILL — Heparin Sodium (Porcine) Inj 1000 Unit/ML: INTRAMUSCULAR | Qty: 10 | Status: AC

## 2020-01-14 MED FILL — Dexmedetomidine HCl-Dextrose IV Soln 400 MCG/100ML-5%: INTRAVENOUS | Qty: 100 | Status: AC

## 2020-01-14 NOTE — Procedures (Signed)
Cortrak  Person Inserting Tube:  Glenette Bookwalter, RD Tube Type:  Cortrak - 43 inches Tube Location:  Right nare Initial Placement:  Stomach Secured by: Bridle Technique Used to Measure Tube Placement:  Documented cm marking at nare/ corner of mouth Cortrak Secured At:  83 cm    X-ray is required, abdominal x-ray has been ordered by the Cortrak team. Please confirm tube placement before using the Cortrak tube.   If the tube becomes dislodged please keep the tube and contact the Cortrak team at [email or web address] ([email or web address]) for replacement.  If after hours and replacement cannot be delayed, place a NG tube and confirm placement with an abdominal x-ray.    [name] RD, LDN Clinical Nutrition Pager listed in [place]

## 2020-01-14 NOTE — Progress Notes (Signed)
[place] for heparin Indication: Impella 5.5  Allergies  Allergen Reactions  . Orange Fruit Anaphylaxis, Hives and Other (See Comments)    Per Pt- Blisters around lips and Hives all over, also  . Penicillins Anaphylaxis    Did it involve swelling of the face/tongue/throat, SOB, or low BP? Yes Did it involve sudden or severe rash/hives, skin peeling, or any reaction on the inside of your mouth or nose? Yes Did you need to seek medical attention at a hospital or doctor's office? No When did it last happen?childhood If all above answers are "NO", may proceed with cephalosporin use.  [name] [Empagliflozin] Itching  . [name] [Insulin Glargine] Nausea And Vomiting    Patient Measurements: Height: 6\' 3"  (190.5 cm) Weight: 125 kg (275 lb 9.2 oz) IBW/kg (Calculated) : 84.5 Heparin dosing wt: 101 kg  Vital Signs: Temp: 99.86 F (37.7 C) (12/17 1245) Temp Source: Core (12/17 1024) BP: 102/75 (12/17 1127) Pulse Rate: 79 (12/17 1245)  Labs: Recent Labs    01/15/2020 0426 12/30/2019 0921 01/15/2020 1518 01/06/2020 1536 01/06/2020 0340 01/23/2020 0533 01/09/2020 1041 01/09/2020 1237 01/22/2020 1453 01/09/2020 1550 01/12/2020 1655 01/20/2020 1720 01/13/20 2321 01/14/20 0406 01/14/20 0428 01/14/20 1009  HGB 12.3*   < > 9.1*   < > 7.5*   < >  --    < > 8.5*   < > 8.5*   < > 8.5* 8.4* 15.6  --   HCT 38.7*   < > 28.0*   < > 22.3*   < >  --    < > 25.0*   < > 25.7*   < > 25.3* 24.4* 46.0  --   PLT 257   < > 231   < > 192   < >  --   --   --   --  136*  --  146* 150  --   --   APTT  --   --  40*  --   --   --   --   --   --   --   --   --   --   --   --   --   LABPROT  --   --  19.5*  --  20.8*  --  21.5*  --   --   --   --   --   --   --   --   --   INR  --   --  1.7*  --  1.9*  --  1.9*  --   --   --   --   --   --   --   --   --   HEPARINUNFRC 0.18*  --   --   --   --   --   --   --   --   --   --   --   --   --   --  <0.10*  CREATININE 1.44*    < >  --    < > 1.62*  --   --    < > 1.80*  --  1.79*  --   --  1.61*  --   --    < > = values in this interval not displayed.    Estimated Creatinine Clearance: 77.3 mL/min (A) (by C-G formula based on SCr of 1.61 mg/dL (H)).   Assessment: 49 yo male s/p impella 5.5 placement. Pt  started on heparinized purge and systemic heparin. Pt now s/p CABG 12/15 with Impella remaining in place c/b significant bleeding and hematoma requiring opened chest and reexploration in OR 12/16.  Heparin running via purge only at this time. Heparin level undetectable as expected, CBC low but stable.  Goal of Therapy:  Heparin level 0.2-0.5 units/ml Monitor platelets by anticoagulation protocol: Yes   Plan:  Continue heparin via the purge Will follow with CVTS regarding systemic heparin once appropriate to initiate Daily heparin level for now   [name], PharmD, BCPS, [facility] Clinical Pharmacist [phone] Please check AMION for all [facility] Pharmacy numbers 01/14/2020

## 2020-01-14 NOTE — Progress Notes (Signed)
Patient ID: Corey Palmer, male   DOB: 1970-06-15, 49 y.o.   MRN: [id number] EVENING ROUNDS NOTE :     [address]       [place] [address]             [phone]                 1 Day Post-Op Procedure(s) (LRB): RE-EXPLORATION POST OPERATIVE OPEN HEART (N/A) TRANSESOPHAGEAL ECHOCARDIOGRAM (TEE) (N/A)  Total Length of Stay:  LOS: 15 days  BP 96/77   Pulse 72   Temp 99.86 F (37.7 C)   Resp (!) 24   Ht 6\' 3"  (1.905 m)   Wt 125 kg   SpO2 100%   BMI 34.44 kg/m   .Intake/Output      12/16 0701 12/17 0700 12/17 0701 12/18 0700   I.V. (mL/kg) 6136.4 (49.1) 1181.5 (9.5)   Blood 932 334.7   Other 199.7 60.4   NG/GT 100 120   IV Piggyback 788.9 397   Total Intake(mL/kg) 8157 (65.3) 2093.6 (16.7)   Urine (mL/kg/hr) 2150 (0.7) 860 (0.7)   Emesis/NG output 900    Blood 500    Chest Tube 2210 260   Total Output 5760 1120   Net +2397 +973.6          . sodium chloride Stopped (01/10/2020 1646)  . sodium chloride 10 mL/hr at 01/24/2020 1517  . sodium chloride Stopped (01/21/2020 0627)  . amiodarone 30 mg/hr (01/14/20 1500)  . epinephrine 4 mcg/min (01/14/20 1500)  . fentaNYL infusion INTRAVENOUS 200 mcg/hr (01/14/20 1500)  . furosemide (LASIX) 200 mg in dextrose 5% 100 mL (2mg /mL) infusion 12 mg/hr (01/14/20 1500)  . impella catheter heparin 50 unit/mL in dextrose 5%    . insulin 2.8 mL/hr at 01/14/20 1500  . lactated ringers 0 mL (01/10/2020 1915)  . meropenem (MERREM) IV Stopped (01/14/20 1418)  . midazolam 2 mg/hr (01/14/20 1500)  . milrinone 0.125 mcg/kg/min (01/14/20 1500)  . norepinephrine (LEVOPHED) Adult infusion 28 mcg/min (01/14/20 1500)  . vancomycin Stopped (01/14/20 1038)  . vasopressin 0.03 Units/min (01/14/20 1530)     Lab Results  Component Value Date   WBC 17.0 (H) 01/14/2020   HGB 15.6 01/14/2020   HCT 46.0 01/14/2020   PLT 150 01/14/2020   GLUCOSE 110 (H) 01/14/2020   CHOL 176 10/01/2018   TRIG 126 10/01/2018   HDL 25 (L) 10/01/2018    LDLDIRECT 116.5 11/01/2011   LDLCALC 126 (H) 10/01/2018   ALT 85 (H) 01/04/2020   AST 173 (H) 01/22/2020   NA 136 01/14/2020   K 4.1 01/14/2020   CL 104 01/14/2020   CREATININE 1.61 (H) 01/14/2020   BUN 25 (H) 01/14/2020   CO2 22 01/14/2020   TSH 3.009 10/01/2018   INR 1.9 (H) 01/09/2020   HGBA1C 10.6 (H) 01/03/2020   Sedated on vent   ci 2.23 On epi , levophed, vasopressin, cordrone Chest open 100-125 uop per hour on lasix drip 12 mg Herby Abraham     Grace Isaac MD  [facility] [phone] Office [phone] 01/14/2020 4:17 PM

## 2020-01-14 NOTE — Progress Notes (Signed)
  Echocardiogram 2D Echocardiogram has been performed.  Michiel Cowboy 01/14/2020, 9:29 AM

## 2020-01-14 NOTE — Progress Notes (Signed)
1 Day Post-Op Procedure(s) (LRB): RE-EXPLORATION POST OPERATIVE OPEN HEART (N/A) TRANSESOPHAGEAL ECHOCARDIOGRAM (TEE) (N/A) Subjective: sedated  Objective: Vital signs in last 24 hours: Temp:  [98.78 F (37.1 C)-102.56 F (39.2 C)] 100.04 F (37.8 C) (12/17 0715) Pulse Rate:  [77-124] 112 (12/17 0715) Cardiac Rhythm: Other (Comment) (12/16 2015) Resp:  [0-48] 24 (12/17 0715) BP: (77-104)/(52-77) 104/77 (12/17 0247) SpO2:  [90 %-100 %] 100 % (12/17 0715) Arterial Line BP: (59-111)/(50-79) 97/73 (12/17 0715) FiO2 (%):  [40 %-60 %] 40 % (12/17 0247) Weight:  [125 kg] 125 kg (12/17 0545)  Hemodynamic parameters for last 24 hours: PAP: (22-46)/(6-27) 39/24 CVP:  [9 mmHg-28 mmHg] 14 mmHg CO:  [5.4 L/min-6.2 L/min] 5.4 L/min CI:  [2.4 L/min/m2-2.7 L/min/m2] 2.4 L/min/m2  Intake/Output from previous day: 12/16 0701 - 12/17 0700 In: 8157 [I.V.:6136.4; Blood:932; NG/GT:100; IV Piggyback:788.9] Out: 8675 [Urine:2150; Emesis/NG output:900; Blood:500; Chest Tube:2210] Intake/Output this shift: No intake/output data recorded.  General appearance: sedated Neurologic: cannot assess fully Heart: tachycardiac, regular Lungs: diminished breath sounds bibasilar Abdomen: mildly distended Extremities: edema 2+ Wound: dressing intact  Lab Results: Recent Labs    01/05/2020 2321 01/14/20 0406 01/14/20 0428  WBC 17.4* 17.0*  --   HGB 8.5* 8.4* 15.6  HCT 25.3* 24.4* 46.0  PLT 146* 150  --    BMET:  Recent Labs    01/20/2020 1655 12/31/2019 1720 01/14/20 0406 01/14/20 0428  NA 135   < > 136 136  K 4.2   < > 4.2 4.1  CL 103  --  104  --   CO2 22  --  22  --   GLUCOSE 122*  --  110*  --   BUN 26*  --  25*  --   CREATININE 1.79*  --  1.61*  --   CALCIUM 8.1*  --  8.2*  --    < > = values in this interval not displayed.    PT/INR:  Recent Labs    01/15/2020 1041  LABPROT 21.5*  INR 1.9*   ABG    Component Value Date/Time   PHART 7.315 (L) 01/14/2020 0428   HCO3 23.4  01/14/2020 0428   TCO2 25 01/14/2020 0428   ACIDBASEDEF 3.0 (H) 01/14/2020 0428   O2SAT 99.0 01/14/2020 0428   CBG (last 3)  Recent Labs    01/14/20 0329 01/14/20 0532 01/14/20 0740  GLUCAP 119* 113* 102*    Assessment/Plan: S/P Procedure(s) (LRB): RE-EXPLORATION POST OPERATIVE OPEN HEART (N/A) TRANSESOPHAGEAL ECHOCARDIOGRAM (TEE) (N/A) Diuresis wean epi to 4-5 mcg  Wean iNO    LOS: 15 days    [name] 01/14/2020

## 2020-01-14 NOTE — Op Note (Signed)
Procedure(s): RE-EXPLORATION POST OPERATIVE OPEN HEART TRANSESOPHAGEAL ECHOCARDIOGRAM (TEE) Procedure Note  Corey Palmer male 49 y.o. 01/12/2020 Procedure(s) and Anesthesia Type:    * RE-EXPLORATION POST OPERATIVE OPEN HEART - General    * TRANSESOPHAGEAL ECHOCARDIOGRAM (TEE) - General  Surgeon(s) and Role:    * [name], MD - Primary   Indications: The patient is status post redo sternotomy for CABG 1 day prior.  He continues to have elevated chest tube output.  He is taken the operating room for reexploration urgently     Surgeon: [name]   Assistants: Staff  Anesthesia: General endotracheal anesthesia  ASA Class: 5    Procedure Detail  RE-EXPLORATION POST OPERATIVE OPEN HEART, TRANSESOPHAGEAL ECHOCARDIOGRAM (TEE) After informed consent was obtained the patient's wife, he is taken the operating room on the above listed date on urgent fashion.  He is placed in the supine position on the operating table.  Anesthesia was confirmed to be adequate.  The anterior chest was cleansed and draped as a sterile field using Betadine solution.  A preop surgical pause was performed.  The existing dressing was taken down and the mediastinum was explored.  There was a significant amount of clot anterior Lee and along the right aspect of the pericardium.  This was copiously irrigated and evacuated.  There was a's demonstrated bleeding site on the right aspect of the hemithorax.  This was controlled.  Copious irrigation was further undertaken.  A new chest tubes were inserted in the mediastinum and bilateral pleural spaces.  The sternum was left open with a spacer in between the sternal halves.  The defect was then covered with Esmark dressing which was followed by an [place] for airtight seal.  This completed the procedure.  All sponge instrument and needle counts were correct  Estimated Blood Loss:  500         Drains: As above       Blood Given: Per anesthesia record          Specimens: None         Implants: none        Complications:  * No complications entered in OR log *         Disposition: ICU - intubated and critically ill.         Condition: stable

## 2020-01-14 NOTE — Op Note (Signed)
CARDIOTHORACIC SURGERY OPERATIVE NOTE  Date of Procedure: 01/27/2020 Preoperative Diagnosis: Severe 3-vessel Coronary Artery Disease with severely depressed LV function  Postoperative Diagnosis: Same  Procedure:    Redo median sternotomy   coronary Artery Bypass Grafting x 4  Left Internal Mammary Artery to Distal Left Anterior Descending Coronary Artery, Saphenous Vein Graft to Posterior Descending Coronary Artery and right posterior lateral artery as a sequenced graft ; saphenous Vein Graft to Obtuse Marginal Branch of Left Circumflex Coronary Artery; Endoscopic Vein Harvest from right thigh and Lower Leg Completion graft surveillance with indocyanine green fluorescence angiography  Surgeon: B.  Murvin Natal, MD  Assistant: Evonnie Pat, PA-C  Anesthesia: General  Operative Findings:  Severely reduced left ventricular systolic function  Good quality left internal mammary artery conduit  Good quality saphenous vein conduit  Fair quality target vessels for grafting    BRIEF CLINICAL NOTE AND INDICATIONS FOR SURGERY  49 year old gentleman with longstanding history of cardiomyopathy presented with worsening symptoms of heart failure.  He underwent evaluation including left heart catheterization demonstrating severe multivessel coronary artery disease.  Due to his severely depressed ejection fraction and endorgan dysfunction he underwent Impella LVAD insertion last week.  He has been stabilized and is now taken to the operating room for coronary revascularization.   DETAILS OF THE OPERATIVE PROCEDURE  Preparation:  The patient is brought to the operating room on the above mentioned date and central monitoring was established by the anesthesia team including placement of Swan-Ganz catheter and radial arterial line. The patient is placed in the supine position on the operating table.  Intravenous antibiotics are administered. General endotracheal anesthesia is induced uneventfully. A  Foley catheter is placed.  Baseline transesophageal echocardiogram was performed.  Findings were notable for preserved RV function and severely reduced left ventricular function with mild mitral regurgitation and a well-positioned Impella LVAD  [name] chest, abdomen, both groins, and both lower extremities are prepared and draped in a sterile manner. A time out procedure is performed.   Surgical Approach and Conduit Harvest:  Redo median sternotomy incision was performed and the chest was entered safely without injury to underlying structures.  Both sides of the sternum were then cleared of attached adhesions.  A Rultract was placed on the left hemisternum, and the left internal mammary artery is dissected from the chest wall and prepared for bypass grafting. The left internal mammary artery is notably good quality conduit. Simultaneously, the greater saphenous vein is obtained from the patient's right leg using endoscopic vein harvest technique. The saphenous vein is notably good quality conduit. After removal of the saphenous vein, the small surgical incisions in the lower extremity are closed with absorbable suture.    Extracorporeal Cardiopulmonary Bypass and Myocardial Protection:  Intrapericardial dissection is then performed.  The ascending aorta and the right atrium are dissected free and cannulated.  Full dose systemic heparinization is then delivered adequate heparinization is verified.  A retrograde cardioplegia cannula is placed through the right atrium into the coronary sinus.  The entire pre-bypass portion of the operation was notable for stable hemodynamics with Impella support  Cardiopulmonary bypass was begun and the surface of the heart is inspected. Distal target vessels are selected for coronary artery bypass grafting. A cardioplegia cannula is placed in the ascending aorta.    The patient is allowed to cool passively to [facility] systemic temperature.  The aortic cross  clamp is applied and cold blood cardioplegia is delivered initially in an antegrade fashion through the aortic  root.  Supplemental cardioplegia is given retrograde through the coronary sinus catheter.  Iced saline slush is applied for topical hypothermia.  The initial cardioplegic arrest is rapid with early diastolic arrest.  Repeat doses of cardioplegia are administered intermittently throughout the entire cross clamp portion of the operation through the aortic root, through the coronary sinus catheter, and through subsequently placed vein grafts in order to maintain completely flat electrocardiogram.   Coronary Artery Bypass Grafting:   The  posterior descending branch of the right coronary artery and the right posterior lateral artery were grafted using a reversed saphenous vein graft in an end-to-side fashion.  At the site of distal anastomosis the target vessel was good quality and measured approximately 1.5 mm in diameter.  The  obtuse marginal branch of the left circumflex coronary artery was grafted using a reversed saphenous vein graft in an end-to-side fashion.  At the site of distal anastomosis the target vessel was good quality and measured approximately 1.5 mm in diameter.  The distal left anterior coronary artery was grafted with the left internal mammary artery in an end-to-side fashion.  At the site of distal anastomosis the target vessel was good quality and measured approximately 1.5 mm in diameter.Anastomotic patency and runoff was confirmed with indocyanine green fluorescence imaging (SPY). All proximal vein graft anastomoses were placed directly to the ascending aorta prior to removal of the aortic cross clamp.  A hotshot dose of cardioplegia was given antegrade and retrograde.  De-airing procedures were performed and the aortic cross-clamp was removed  Procedure Completion:  All proximal and distal coronary anastomoses were inspected for hemostasis and appropriate graft  orientation. Epicardial pacing wires are fixed to the right ventricular outflow tract and to the right atrial appendage. The patient is rewarmed to 37C temperature. The patient is weaned and disconnected from cardiopulmonary bypass.  [name] rhythm at separation from bypass was sinus bradycardia.  The patient was weaned from cardiopulmonary bypass with moderate inotropic support and use of the Impella.   Followup transesophageal echocardiogram performed after separation from bypass revealed no changes from the preoperative exam.  The aortic and venous cannula were removed uneventfully. Protamine was administered to reverse the anticoagulation. The mediastinum and pleural space were inspected for hemostasis and irrigated with saline solution. The mediastinum and both pleural spaces were drained using fluted chest tubes placed through separate stab incisions inferiorly.  The soft tissues anterior to the aorta were reapproximated loosely. The sternum is closed with double strength sternal wire. The soft tissues anterior to the sternum were closed in multiple layers and the skin is closed with a running subcuticular skin closure.  The post-bypass portion of the operation was notable for stable rhythm and hemodynamics.     Disposition:  The patient tolerated the procedure well and is transported to the surgical intensive care in stable condition. There are no intraoperative complications. All sponge instrument and needle counts are verified correct at completion of the operation.    Jayme Cloud, MD 01/14/2020 11:04 AM

## 2020-01-14 NOTE — Progress Notes (Signed)
Nutrition Follow-up  DOCUMENTATION CODES:   Not applicable  INTERVENTION:   Tube Feeding via Cortrak: Initiate Pivot 1.5 at 20 ml/hr for 24 hours, goal rate of 60 ml/hr Begin at 20 ml/hr for the 1st 24 hours;then  titrate by 10 ml q 12 hours until goal rate of 60 ml/hr Goal rate provides 135 g of protein, 2160 kcals and 1094 mL of free water  NUTRITION DIAGNOSIS:   Inadequate oral intake related to acute illness as evidenced by NPO status.  Being addressed via TF  GOAL:   Patient will meet greater than or equal to 90% of their needs  Progressing  MONITOR:   Weight trends,Vent status  REASON FOR ASSESSMENT:   Ventilator    ASSESSMENT:   49 yo male admitted with Afib with RVR requiring cardioversion, ischemic CM requiring inotropes and impella support and ultimately underwent CABG x 4 on 12/15 with Impella still in place. PMH includes CAD, benign stomach cancer, CHF, DM, depression, CM, GERD, HTN  12/02 Admitted 12/08 Transferred to ICU 12/11 Impella LVAD placed 12/15 Redo Sternotomy, CABG on pump x 4, Intubated 12/16 Bedside chest exploration for tamponade with moderate amount of anterior mediastinal clot removed, OR for bleeding s/p CABG for mediastinal washout  Consult received for TF today with plan for Cortrak placement as well  Pt remains sedated on vent, remains on levophed (40), epinephrine (down to 7), vasopressin (0.04) and milrinone. MAPs >25, Diastolic >95  Chest remains open OG in place, 200 mL out today  Labs: reviewed  Meds: lasix gtt, insulin gtt    Diet Order:   Diet Order    None      EDUCATION NEEDS:   Not appropriate for education at this time  Skin:  Skin Assessment: Skin Integrity Issues: Skin Integrity Issues:: Incisions,Other (Comment) Incisions: stermun, chest tubes x 4 Other: venous stasis ulcers  Last BM:  12/15  Height:   Ht Readings from Last 1 Encounters:  01/27/2020 6\' 3"  (1.905 m)    Weight:   Wt Readings  from Last 1 Encounters:  01/14/20 125 kg    Ideal Body Weight:  89 kg  BMI:  Body mass index is 34.44 kg/m.  Estimated Nutritional Needs:   Kcal:  2100-2300 kcals  Protein:  135-170 g  Fluid:  >/= 1.5 L    Kerman Passey MS, RDN, LDN, CNSC Registered Dietitian III Clinical Nutrition RD Pager and On-Call Pager Number Located in [place]

## 2020-01-14 NOTE — Progress Notes (Signed)
Patient ID: Corey Palmer, male   DOB: 03-02-1970, 49 y.o.   MRN: [id number]     Advanced Heart Failure Rounding Note  PCP-Cardiologist: No primary care provider on file.   Subjective:    - 12/7 Torsades -ICD shock x1.  - 12/11 Impella 5.5 placed.  - 12/12 Impella repositioned in OR - 12/15 CABG x 4 with LIMA-LAD, SVG-PDA/PLV, SVG-OM - 12/16 DCCV afib.  Back to OR to re-open chest and again to evacuate hematoma.   More stable this morning.  He remains on NE 40, epinephrine 7, milrinone 0.125, vasopressin 0.04.  MAP in 80s currently, creatinine 1.6, lactate 1.2.  Tolerating P8 Impella now without suction alarms.  Lasix gtt at 10 mg/hr, I>>O for the day.   In atrial fibrillation in 110s-120s, on amiodarone gtt 60 mg/hr.  Hgb 8.4 with plts 150 this morning.    Tm 100, WBCs 17, PCT 13.46.  Cultures NGTD, he is on meropenem and vancomycin.   Swan numbers: CVP 16 PA 40/21  CI 2.4 Co-ox 53% PAPi 1.2  Impella 5.5 P8 Flow 4.3 Heparin only in purge  Echo (12/10): EF <20%, moderate LV dilation, normal RV size with mildly decreased systolic function.   RHC/LHC: Coronary Findings   Diagnostic Dominance: Right  Left Main  30% distal left main stenosis.  Left Anterior Descending  40% ostial LAD stenosis. 90% stenosis proximal LAD just distal to D1 take-off. Large D1 with 99% proximal stenosis, 60% mid vessel stenosis.  Left Circumflex  Long up to 70% proximal LCx stenosis.  Right Coronary Artery  95% mid RCA stenosis. 75% distal RCA stenosis. 60% stenosis proximal PLV. 99% stenosis mid PDA.   Intervention   No interventions have been documented.  Right Heart  Right Heart Pressures RHC Procedural Findings: Hemodynamics (mmHg) RA mean 16 RV 53/12 PA 65/29, mean 46 PCWP mean 31 LV 119/28 AO 118/87  Oxygen saturations: PA 45% AO 93%  Cardiac Output (Fick) 3.39  Cardiac Index (Fick) 1.4 PVR 4.4 WU PAPi 2.25     Objective:   Weight Range: 125 kg Body mass  index is 34.44 kg/m.   Vital Signs:   Temp:  [98.78 F (37.1 C)-102.56 F (39.2 C)] 100.04 F (37.8 C) (12/17 0715) Pulse Rate:  [77-155] 112 (12/17 0715) Resp:  [0-48] 24 (12/17 0715) BP: (77-104)/(52-77) 104/77 (12/17 0247) SpO2:  [90 %-100 %] 100 % (12/17 0715) Arterial Line BP: (59-111)/(50-79) 97/73 (12/17 0715) FiO2 (%):  [40 %-60 %] 40 % (12/17 0247) Weight:  [125 kg] 125 kg (12/17 0545) Last BM Date: 01/18/2020  Weight change: Filed Weights   01/11/20 0630 01/24/2020 0400 01/14/20 0545  Weight: 106.9 kg 122.2 kg 125 kg    Intake/Output:   Intake/Output Summary (Last 24 hours) at 01/14/2020 0735 Last data filed at 01/14/2020 0700 Gross per 24 hour  Intake 8157.01 ml  Output 5760 ml  Net 2397.01 ml      Physical Exam   CVP 16 General: Sedated on vent Neck: JVP 14+, no thyromegaly or thyroid nodule.  Lungs: Decreased at bases.  CV: Lateral PMI.  Heart tachy, irregular S1/S2, no S3/S4, no murmur.  1+ ankle edema.   Abdomen: Soft, nontender, no hepatosplenomegaly, no distention.  Skin: Intact without lesions or rashes.  Neurologic: Alert and oriented x 3.  Psych: Normal affect. Extremities: No clubbing or cyanosis.  HEENT: Normal.    Telemetry    Atrial fibrillation 110s, personally reviewed  Labs    CBC Recent Labs  01/15/2020 2321 01/14/20 0406 01/14/20 0428  WBC 17.4* 17.0*  --   HGB 8.5* 8.4* 15.6  HCT 25.3* 24.4* 46.0  MCV 85.5 84.1  --   PLT 146* 150  --    Basic Metabolic Panel Recent Labs    01/26/2020 1315 01/18/2020 1450 01/19/2020 1655 01/16/2020 1720 01/14/20 0406 01/14/20 0428  NA 133*   < > 135   < > 136 136  K 4.2   < > 4.2   < > 4.2 4.1  CL 100   < > 103  --  104  --   CO2 20*  --  22  --  22  --   GLUCOSE 156*   < > 122*  --  110*  --   BUN 26*   < > 26*  --  25*  --   CREATININE 1.82*   < > 1.79*  --  1.61*  --   CALCIUM 8.3*  --  8.1*  --  8.2*  --   MG 2.3  --  2.1  --   --   --    < > = values in this interval not  displayed.   Liver Function Tests Recent Labs    01/03/2020 1022  AST 173*  ALT 85*  ALKPHOS 73  BILITOT 1.6*  PROT 4.3*  ALBUMIN 2.4*   No results for input(s): LIPASE, AMYLASE in the last 72 hours. Cardiac Enzymes No results for input(s): CKTOTAL, CKMB, CKMBINDEX, TROPONINI in the last 72 hours.  BNP: BNP (last 3 results) Recent Labs    01/23/2020 1619  BNP 577.5*    ProBNP (last 3 results) No results for input(s): PROBNP in the last 8760 hours.   D-Dimer No results for input(s): DDIMER in the last 72 hours. Hemoglobin A1C No results for input(s): HGBA1C in the last 72 hours. Fasting Lipid Panel No results for input(s): CHOL, HDL, LDLCALC, TRIG, CHOLHDL, LDLDIRECT in the last 72 hours. Thyroid Function Tests No results for input(s): TSH, T4TOTAL, T3FREE, THYROIDAB in the last 72 hours.  Invalid input(s): FREET3  Other results:   Imaging    No results found.   Medications:     Scheduled Medications: . acetaminophen  1,000 mg Oral Q6H   Or  . acetaminophen (TYLENOL) oral liquid 160 mg/5 mL  1,000 mg Per Tube Q6H  . aspirin EC  325 mg Oral Daily   Or  . aspirin  324 mg Per Tube Daily  . atorvastatin  80 mg Per Tube Daily  . bisacodyl  10 mg Oral Daily   Or  . bisacodyl  10 mg Rectal Daily  . busPIRone  10 mg Per Tube TID  . chlorhexidine gluconate (MEDLINE KIT)  15 mL Mouth Rinse BID  . Chlorhexidine Gluconate Cloth  6 each Topical Daily  . docusate  100 mg Per Tube BID  . fentaNYL (SUBLIMAZE) injection  25 mcg Intravenous Once  . FLUoxetine  40 mg Per Tube BID  . mouth rinse  15 mL Mouth Rinse 10 times per day  . pantoprazole sodium  40 mg Per Tube Daily  . polyethylene glycol  17 g Per Tube Daily  . sodium chloride flush  10-40 mL Intracatheter Q12H  . sodium chloride flush  3 mL Intravenous Q12H  . valproic acid  250 mg Per Tube BID    Infusions: . sodium chloride Stopped (01/07/2020 1646)  . sodium chloride 10 mL/hr at 01/04/2020 1517  .  sodium chloride Stopped (01/25/2020  0627)  . amiodarone 60 mg/hr (01/14/20 0700)  . electrolyte-A 75 mL/hr at 01/14/20 0700  . epinephrine 7 mcg/min (01/14/20 0700)  . fentaNYL infusion INTRAVENOUS 125 mcg/hr (01/14/20 0700)  . furosemide (LASIX) 200 mg in dextrose 5% 100 mL (61m/mL) infusion 10 mg/hr (01/14/20 0700)  . impella catheter heparin 50 unit/mL in dextrose 5%    . insulin 2.8 mL/hr at 01/14/20 0700  . lactated ringers 0 mL (01/24/2020 1915)  . meropenem (MERREM) IV 200 mL/hr at 01/14/20 0700  . midazolam 1 mg/hr (01/14/20 0700)  . milrinone 0.125 mcg/kg/min (01/14/20 0700)  . norepinephrine (LEVOPHED) Adult infusion 40 mcg/min (01/14/20 0700)  . vancomycin Stopped (01/23/2020 2339)  . vasopressin 0.04 Units/min (01/14/20 0700)    PRN Medications: sodium chloride, sodium chloride, dextrose, fentaNYL, fentaNYL (SUBLIMAZE) injection, fentaNYL (SUBLIMAZE) injection, metoprolol tartrate, midazolam, midazolam, midazolam, ondansetron (ZOFRAN) IV, oxyCODONE, sodium chloride flush, sodium chloride flush     Assessment/Plan   1. Atrial fibrillation: H/o PAF.  He was on dofetilide in the remote past but this was stopped due to noncompliance. He had an upper GI bleed from antral ulcers in 3/12. He was seen by GI and was deemed safe to restart anticoagulation as long as he remains on a PPI.  No apparent recurrence of AF until just prior to this admission, was cardioverted back to NSR in ER but went back into atrial fibrillation.  Post-op afib, DCCV to NSR/BiV pacing am 12/16, now back in atrial fibrillation with RVR again, 110s.  - Can decrease amiodarone to 30 mg/hr. - Would not try to cardiovert again until back on heparin gtt and vasoactive meds weaned.  - Off heparin post-op with bleeding, only getting heparin in Impella purge currently.  Restart systemic heparin gtt when surgery thinks safe to do so. - Unable to do Maze with CABG.   2. CAD: Nonobstructive mild CAD on remote cath in 2002.  He has had chest pain but is pleuritic.  HS-TnI 64 => 92, suspect demand ischemia with volume overload and afib/RVR.   Cath this admission with severe 3 vessel disease, ideally treated by CABG given extent of disease. CABG x 4 on 12/15.  - Continue atorvastatin, ASA.  3. Cardiogenic shock/acute on chronic systolic CHF: Initially nonischemic cardiomyopathy. Possible familial cardiomyopathy as both parents had cardiomyopathy and died at around 636However, [employer] gene testing did not show any common mutation for cardiomyopathy.  However, this admission noted to have severe 3 vessel disease so suspect component of ischemic cardiomyopathy. [employer] ICD. Echo in 8/20 with EF 15% and mildly decreased RV function. Echo this admission with EF < 20%, moderate LV dilation, RV mildly reduced, severe LAE, no significant MR.Low output HF with markedly low EF.  He has a long history of cardiomyopathy (20 yrs), tends to minimize symptoms.  Cardiorenal syndrome with creatinine up to 2.2,  stabilized on milrinone and Impella 5.5. SCr 1.44 day of CABG. CABG 12/15.  Post-op shock, back to OR 12/16 to open chest (chest wall compartment syndrome) and again to evacuate hematoma.  TEE post-op with severe RV dysfunction. MAP more stable this morning, CI 2.4 from [place]  Tolerating Impella P8 w/o suction.  Currently on NE 40, milrinone 0.125, epinephrine 7, vasopressin 0.04.  On purge heparin.  Weight up considerably with volume overload. Creatinine stable 1.6.  - Should be able to wean pressors further today, aim for MAP > 65.  - Needs diuresis, increase Lasix to 12 mg/hr and give dose of metolazone 2.5 x  1.  - Continue Impella P8 (no further suction alarms), send LDH this morning.  Urine clear, plts normal range.  Will get limited echo for position.   4. Acute on chronic hypoxemic respiratory failure: Pulmonary edema, vent per CCM.  5. AKI on CKD stage ?3: Creatinine stable 1.6.      6. Diabetes: Insulin.  7. Hyponatremia:  Resolved.  8. Torsades/ICD shock: 12/7/212 in hospital event, in setting of severe hypokalemia and hypomagnesemia.   9. Gout: h/o gout. Complains of rt knee pain c/w previous flares - treated w/ prednisone burst (completed).  10: ID: Tm 100, PCT 13.46.  CXR with vascular congestions.  Cultures NGTD.  - Covering with vancomycin/meropenem (PCN allergy).   CRITICAL CARE Performed by: Loralie Champagne  Total critical care time: 40 minutes  Critical care time was exclusive of separately billable procedures and treating other patients.  Critical care was necessary to treat or prevent imminent or life-threatening deterioration.  Critical care was time spent personally by me on the following activities: development of treatment plan with patient and/or surrogate as well as nursing, discussions with consultants, evaluation of patient's response to treatment, examination of patient, obtaining history from patient or surrogate, ordering and performing treatments and interventions, ordering and review of laboratory studies, ordering and review of radiographic studies, pulse oximetry and re-evaluation of patient's condition.   Loralie Champagne 01/14/2020 7:35 AM

## 2020-01-14 NOTE — Progress Notes (Signed)
NAME:  Corey Palmer, MRN:  [id number], DOB:  1970-06-24, LOS: 83 ADMISSION DATE:  01/22/2020, CONSULTATION DATE: 01/25/2020 REFERRING MD: Aundra Dubin, CHIEF COMPLAINT: Respiratory failure post Impella  HPI/course in hospital  49 year old man underwent Impella 5.5 insertion 12/11 for persistent symptoms of forward failure with low cardiac indices.  He presented last week for ICD shocks and was found to be in rapid atrial fibrillation for which he was cardioverted.  He was also found to be in decompensated heart failure with an ejection fraction of 15% and multivessel coronary disease on left heart catheterization.  He was started on tropes and diuresed which improved his congestive symptoms but his index remained low so an Impella was placed to optimize his hemodynamics prior to a high risk redo-bypass.  Uneventful Impella implantation.  Patient returned from operating room intubated.  He had to go back to the OR for displacement of Impella 12/12.  Underwent CABGx4 on 12/15.  Returned to ICU on significant inotropic support and intubated.  Past Medical History   Past Medical History:  Diagnosis Date  . Anginal pain ([place])   . Anxiety   . Arthritis   . Automatic implantable cardioverter-defibrillator in situ 2007   [employer]   . Bipolar disorder ([place])   . CAD (coronary artery disease)    Cardiac cath (08/19/2000) - Nonobstructive, 40% stenosis of LAD,.   . Cancer [facility]) 2013   benign stomach cancer  . CHF (congestive heart failure) ([place])   . Chondrocalcinosis of right knee 06/11/2013  . Chronic systolic heart failure ([place])    2D echo (81/8563) - Systolic function severely reduced., LV EF 20-25%. Akinesis of apical and anteroseptal mycoardium.  last 2D echo (11/2008 ), LV EF 25-30%, diffuse hypokinesis, and grade 1 diastolic dysfunction.  . Depression   . Diabetes uncomplicated adult-type II    on insulin therapy, a1c 12.9 in 01/2011  . Dilated cardiomyopathy ([place])    status post ICD  placement in 2008 c/b tearing at the atrial junction resulting in tamponade and urgent thoracotomy  . Dyslipidemia   . Dysrhythmia   . GERD (gastroesophageal reflux disease)   . Gout, unspecified   . Hepatic steatosis 2011   seen on ultrasound.   . History of kidney stones   . Hx of echocardiogram    Echo (04/2013):  Mild LVH, EF 20-25%, mild LAE, mild to mod RVE with mild reduced RVSF.  Marland Kitchen Hypertension   . Obesity (BMI 30.0-34.9)   . Pacemaker   . Paroxysmal atrial fibrillation ([facility])    on chronic coumadin, goal INR 2-3. status post direct current  . Peptic ulcer   . Shortness of breath   . Sleep apnea    does not have CPAP     Past Surgical History:  Procedure Laterality Date  . BEDSIDE EVACUATION OF HEMATOMA N/A 01/23/2020   Procedure: CHEST EXPLORATION @2H12 ;  Surgeon: Ivin Poot, MD;  Location: [facility];  Service: Cardiovascular;  Laterality: N/A;  . BIOPSY  12/01/2019   Procedure: BIOPSY;  Surgeon: Arta Silence, MD;  Location: [facility] ENDOSCOPY;  Service: Endoscopy;;  . CARDIAC CATHETERIZATION    . CARDIAC PACEMAKER PLACEMENT    . CHOLECYSTECTOMY    . COLONOSCOPY    . CORONARY ARTERY BYPASS GRAFT    . CORONARY ARTERY BYPASS GRAFT N/A 01/28/2020   Procedure: CORONARY ARTERY BYPASS GRAFTING (CABG), ON PUMP, TIMES FOUR, USING LEFT INTERNAL MAMMARY ARTERY AND ENDOSCOPICALLY HARVESTED RIGHT GREATER SAPHENOUS VEIN;  Surgeon: [name], MD;  Location: [facility] OR;  Service: Open Heart Surgery;  Laterality: N/A;  . ESOPHAGOGASTRODUODENOSCOPY  04/05/2011   Procedure: ESOPHAGOGASTRODUODENOSCOPY (EGD);  Surgeon: Irene Shipper, MD;  Location: [facility] ENDOSCOPY;  Service: Endoscopy;  Laterality: N/A;  . ESOPHAGOGASTRODUODENOSCOPY (EGD) WITH PROPOFOL N/A 12/01/2019   Procedure: ESOPHAGOGASTRODUODENOSCOPY (EGD) WITH PROPOFOL;  Surgeon: Arta Silence, MD;  Location: [facility] ENDOS[facility];  Service: Endoscopy;  Laterality: N/A;  . JOINT REPLACEMENT     left knee replacement  . KNEE ARTHROSCOPY Right  06/11/2013   Procedure: RIGHT KNEE ARTHROSCOPY with chondroplasty;  Surgeon: Johnny Bridge, MD;  Location: [facility];  Service: Orthopedics;  Laterality: Right;  . PLACEMENT OF IMPELLA LEFT VENTRICULAR ASSIST DEVICE N/A 01/06/2020   Procedure: PLACEMENT OF IMPELLA LEFT VENTRICULAR ASSIST DEVICE;  Surgeon: [name], MD;  Location: [facility];  Service: Open Heart Surgery;  Laterality: N/A;  . PLACEMENT OF IMPELLA LEFT VENTRICULAR ASSIST DEVICE Right 01/06/2020   Procedure: AXILLARY  ARTERY PLACEMENT OF ABIOMED IMPELLA  5.5 LEFT VENTRICULAR ASSIST DEVICE;  Surgeon: [name], MD;  Location: [place];  Service: Open Heart Surgery;  Laterality: Right;  . RIGHT/LEFT HEART CATH AND CORONARY ANGIOGRAPHY N/A 01/27/2020   Procedure: RIGHT/LEFT HEART CATH AND CORONARY ANGIOGRAPHY;  Surgeon: Larey Dresser, MD;  Location: [facility] CV LAB;  Service: Cardiovascular;  Laterality: N/A;  . TEE WITHOUT CARDIOVERSION N/A 01/09/2020   Procedure: TRANSESOPHAGEAL ECHOCARDIOGRAM (TEE);  Surgeon: [name], MD;  Location: [facility];  Service: Open Heart Surgery;  Laterality: N/A;  . TOTAL KNEE ARTHROPLASTY Left       Interim history/subjective:   Events noted, DC cardioverted but back in A. fib Had to go back to the operating room for evacuation of hematoma Remains on Lasix infusion Norepinephrine weaning, currently 40, epinephrine 7, milrinone 0.125, vasopressin 0.04 CVP 16, cardiac index 2.4 Amiodarone being decreased from 60 to 30 All cultures remain negative Renal function stable, lactate 1.2  Objective   Blood pressure (!) 107/27, pulse (!) 116, temperature 99.68 F (37.6 C), resp. rate (!) 24, height 6\' 3"  (1.905 m), weight 125 kg, SpO2 91 %. PAP: (22-45)/(6-27) 43/27 CVP:  [11 mmHg-28 mmHg] 20 mmHg CO:  [5.4 L/min-6.2 L/min] 5.6 L/min CI:  [2.4 L/min/m2-2.7 L/min/m2] 2.4 L/min/m2  Vent Mode: PRVC FiO2 (%):  [40 %-60 %] 40 % Set Rate:  [24 bmp] 24 bmp Vt Set:  [510 mL] 510 mL PEEP:   [5 cmH20] 5 cmH20 Pressure Support:  [10 cmH20] 10 cmH20 Plateau Pressure:  [24 cmH20-25 cmH20] 25 cmH20   Intake/Output Summary (Last 24 hours) at 01/14/2020 1049 Last data filed at 01/14/2020 1024 Gross per 24 hour  Intake 7530.98 ml  Output 5860 ml  Net 1670.98 ml   Filed Weights   01/11/20 0630 12/31/2019 0400 01/14/20 0545  Weight: 106.9 kg 122.2 kg 125 kg   PAP: (22-45)/(6-27) 43/27 CVP:  [11 mmHg-28 mmHg] 20 mmHg CO:  [5.4 L/min-6.2 L/min] 5.6 L/min CI:  [2.4 L/min/m2-2.7 L/min/m2] 2.4 L/min/m2  Examination: Physical exam: General: Ill-appearing man, intubated, sedated HEENT: ET tube in good position, OG tube in position, no oral lesions Neuro: Sedated, does not wake to voice, does grimace, moves stimulation Chest: Median sternotomy, regular, Impella sounds audible, chest tubes in place, clear Abdomen: Nondistended, hypoactive bowel sounds Skin: Cool extremities, no rash  Ancillary tests (personally reviewed)  CBC: Recent Labs  Lab 01/17/2020 0340 01/18/2020 0533 01/04/2020 1022 01/24/2020 1026 01/19/2020 1655 01/18/2020 1720 01/13/20 2321 01/14/20 0406 01/14/20 0428  WBC  15.4*  --  16.7*  --  21.5*  --  17.4* 17.0*  --   HGB 7.5*   < > 8.4*   < > 8.5* 7.8* 8.5* 8.4* 15.6  HCT 22.3*   < > 24.2*   < > 25.7* 23.0* 25.3* 24.4* 46.0  MCV 85.1  --  84.6  --  87.4  --  85.5 84.1  --   PLT 192  --  155  --  136*  --  146* 150  --    < > = values in this interval not displayed.    Basic Metabolic Panel: Recent Labs  Lab 01/01/2020 0426 01/15/2020 0921 01/27/2020 2026 01/27/2020 2028 01/15/2020 0012 01/27/2020 0020 01/28/2020 0340 01/22/2020 0533 01/14/2020 1315 01/12/2020 1450 01/24/2020 1453 01/26/2020 1550 12/30/2019 1655 01/14/2020 1720 01/14/20 0406 01/14/20 0428  NA 130*   < > 132*   < > 134*   < > 133*   < > 133*   < > 135 135 135 136 136 136  K 4.0   < > 4.1   < > 4.2   < > 4.2   < > 4.2   < > 4.3 4.1 4.2 4.3 4.2 4.1  CL 98   < > 101  --  102  --  102  --  100  --  102  --  103  --   104  --   CO2 23  --  20*  --  20*  --  19*  --  20*  --   --   --  22  --  22  --   GLUCOSE 152*   < > 146*  --  146*  --  126*  --  156*  --  138*  --  122*  --  110*  --   BUN 25*   < > 24*  --  24*  --  25*  --  26*  --  26*  --  26*  --  25*  --   CREATININE 1.44*   < > 1.48*  --  1.48*  --  1.62*  --  1.82*  --  1.80*  --  1.79*  --  1.61*  --   CALCIUM 8.7*  --  9.9  --  8.7*  --  8.2*  --  8.3*  --   --   --  8.1*  --  8.2*  --   MG 2.0  --  2.5*  --   --   --  2.5*  --  2.3  --   --   --  2.1  --   --   --    < > = values in this interval not displayed.   GFR: Estimated Creatinine Clearance: 77.3 mL/min (A) (by C-G formula based on SCr of 1.61 mg/dL (H)). Recent Labs  Lab 01/01/2020 0702 01/02/2020 0950 01/27/2020 1022 12/31/2019 1655 12/29/2019 2321 01/14/20 0406  PROCALCITON  --   --  15.15  --   --  13.46  WBC  --   --  16.7* 21.5* 17.4* 17.0*  LATICACIDVEN 2.7* 1.7  --  1.6  --  1.2    Liver Function Tests: Recent Labs  Lab 01/18/2020 1022  AST 173*  ALT 85*  ALKPHOS 73  BILITOT 1.6*  PROT 4.3*  ALBUMIN 2.4*   No results for input(s): LIPASE, AMYLASE in the last 168 hours. No results for input(s): AMMONIA in the last 168 hours.  ABG    Component Value Date/Time   PHART 7.315 (L) 01/14/2020 0428   PCO2ART 46.3 01/14/2020 0428   PO2ART 151 (H) 01/14/2020 0428   HCO3 23.4 01/14/2020 0428   TCO2 25 01/14/2020 0428   ACIDBASEDEF 3.0 (H) 01/14/2020 0428   O2SAT 99.0 01/14/2020 0428     Coagulation Profile: Recent Labs  Lab 01/11/2020 1518 01/07/2020 0340 01/06/2020 1041  INR 1.7* 1.9* 1.9*    Cardiac Enzymes: No results for input(s): CKTOTAL, CKMB, CKMBINDEX, TROPONINI in the last 168 hours.  HbA1C: Hemoglobin A1C  Date/Time Value Ref Range Status  12/01/2019 04:04 PM 10.8 (A) 4.0 - 5.6 % Final  02/03/2019 04:24 PM 12.5 (A) 4.0 - 5.6 % Final   Hgb A1c MFr Bld  Date/Time Value Ref Range Status  01/03/2020 03:41 PM 10.6 (H) 4.8 - 5.6 % Final    Comment:     (NOTE) Pre diabetes:          5.7%-6.4%  Diabetes:              >6.4%  Glycemic control for   <7.0% adults with diabetes   10/01/2018 04:02 PM 11.9 (H) 4.8 - 5.6 % Final    Comment:    (NOTE) Pre diabetes:          5.7%-6.4% Diabetes:              >6.4% Glycemic control for   <7.0% adults with diabetes     CBG: Recent Labs  Lab 01/14/20 0329 01/14/20 0532 01/14/20 0740 01/14/20 0850 01/14/20 0958  GLUCAP 119* 113* 102* 115* 120*    Assessment & Plan:  Acute hypoxic respiratory failure Cardiogenic shock Acute on chronic systolic heart failure requiring mechanical cardiac support with Impala and titration of inotropes Severe coronary artery disease status post CABG, complicated by cardiac tamponade, post hematoma removal in OR x 2 Bipolar affective Diabetes type 2 Paroxysmal atrial fibrillation requiring amiodarone infusion.  Acute kidney injury-improving cardiorenal syndrome  Hyponatremia Hypomagnesemia Acute blood loss anemia  -Continue ventilator support, changed to PRVC, 6 cc/kg.  Not in a position to perform SBT at this time given his hemodynamic instability, overall instability -Weaning pressors, inotropes as able as recommended by cardiology, T CTS -Impella settings as per cardiology -Continue amiodarone infusion, Lasix infusion -Wean inhaled nitric to 20 ppm today 12/17 -Empiric antibiotics as ordered -Continue to follow serum creatinine, electrolytes -Follow CBC, goal hemoglobin 8.0   Daily Goals Checklist  Pain/Anxiety/Delirium protocol (if indicated): versed and fentanyl infusions.  VAP protocol (if indicated): N/A DVT prophylaxis: SCD GI prophylaxis: Protonix Glucose control: Basal and bolus insulin  Code Status: Full code Family Communication: Per primary team Disposition: ICU    Total critical care time: 34 minutes  Performed by: Baltazar Apo   Critical care time was exclusive of separately billable procedures and treating other  patients.   Critical care was necessary to treat or prevent imminent or life-threatening deterioration.   Critical care was time spent personally by me on the following activities: development of treatment plan with patient and/or surrogate as well as nursing, discussions with consultants, evaluation of patient's response to treatment, examination of patient, obtaining history from patient or surrogate, ordering and performing treatments and interventions, ordering and review of laboratory studies, ordering and review of radiographic studies, pulse oximetry and re-evaluation of patient's condition.   Baltazar Apo, MD, PhD 01/14/2020, 11:00 AM [facility] [phone] or if no answer [phone]

## 2020-01-15 ENCOUNTER — Inpatient Hospital Stay (HOSPITAL_COMMUNITY): Payer: BC Managed Care – PPO

## 2020-01-15 LAB — VANCOMYCIN, TROUGH: Vancomycin Tr: 26 ug/mL (ref 15–20)

## 2020-01-15 LAB — COOXEMETRY PANEL
Carboxyhemoglobin: 1.1 % (ref 0.5–1.5)
Methemoglobin: 1.2 % (ref 0.0–1.5)
O2 Saturation: 55.7 %
Total hemoglobin: 10.6 g/dL — ABNORMAL LOW (ref 12.0–16.0)

## 2020-01-15 LAB — PREPARE PLATELET PHERESIS
Unit division: 0
Unit division: 0

## 2020-01-15 LAB — TYPE AND SCREEN
ABO/RH(D): A POS
Antibody Screen: NEGATIVE
Unit division: 0
Unit division: 0
Unit division: 0
Unit division: 0
Unit division: 0
Unit division: 0
Unit division: 0
Unit division: 0
Unit division: 0
Unit division: 0
Unit division: 0
Unit division: 0

## 2020-01-15 LAB — CBC
HCT: 27.4 % — ABNORMAL LOW (ref 39.0–52.0)
HCT: 27.2 % — ABNORMAL LOW (ref 39.0–52.0)
HCT: 26.4 % — ABNORMAL LOW (ref 39.0–52.0)
Hemoglobin: 9.3 g/dL — ABNORMAL LOW (ref 13.0–17.0)
Hemoglobin: 9.6 g/dL — ABNORMAL LOW (ref 13.0–17.0)
Hemoglobin: 8.6 g/dL — ABNORMAL LOW (ref 13.0–17.0)
MCH: 28.6 pg (ref 26.0–34.0)
MCH: 29.7 pg (ref 26.0–34.0)
MCH: 28.2 pg (ref 26.0–34.0)
MCHC: 33.9 g/dL (ref 30.0–36.0)
MCHC: 35.3 g/dL (ref 30.0–36.0)
MCHC: 32.6 g/dL (ref 30.0–36.0)
MCV: 84.3 fL (ref 80.0–100.0)
MCV: 84.2 fL (ref 80.0–100.0)
MCV: 86.6 fL (ref 80.0–100.0)
Platelets: 165 10*3/uL (ref 150–400)
Platelets: 173 10*3/uL (ref 150–400)
Platelets: 171 10*3/uL (ref 150–400)
RBC: 3.25 MIL/uL — ABNORMAL LOW (ref 4.22–5.81)
RBC: 3.23 MIL/uL — ABNORMAL LOW (ref 4.22–5.81)
RBC: 3.05 MIL/uL — ABNORMAL LOW (ref 4.22–5.81)
RDW: 16.6 % — ABNORMAL HIGH (ref 11.5–15.5)
RDW: 17 % — ABNORMAL HIGH (ref 11.5–15.5)
RDW: 17.2 % — ABNORMAL HIGH (ref 11.5–15.5)
WBC: 19 10*3/uL — ABNORMAL HIGH (ref 4.0–10.5)
WBC: 20.3 10*3/uL — ABNORMAL HIGH (ref 4.0–10.5)
WBC: 17.7 10*3/uL — ABNORMAL HIGH (ref 4.0–10.5)
nRBC: 0.1 % (ref 0.0–0.2)
nRBC: 0.2 % (ref 0.0–0.2)
nRBC: 0.5 % — ABNORMAL HIGH (ref 0.0–0.2)

## 2020-01-15 LAB — BASIC METABOLIC PANEL
Anion gap: 11 (ref 5–15)
Anion gap: 10 (ref 5–15)
Anion gap: 9 (ref 5–15)
BUN: 30 mg/dL — ABNORMAL HIGH (ref 6–20)
BUN: 30 mg/dL — ABNORMAL HIGH (ref 6–20)
BUN: 32 mg/dL — ABNORMAL HIGH (ref 6–20)
CO2: 22 mmol/L (ref 22–32)
CO2: 24 mmol/L (ref 22–32)
CO2: 24 mmol/L (ref 22–32)
Calcium: 8.2 mg/dL — ABNORMAL LOW (ref 8.9–10.3)
Calcium: 8.2 mg/dL — ABNORMAL LOW (ref 8.9–10.3)
Calcium: 8.1 mg/dL — ABNORMAL LOW (ref 8.9–10.3)
Chloride: 103 mmol/L (ref 98–111)
Chloride: 103 mmol/L (ref 98–111)
Chloride: 104 mmol/L (ref 98–111)
Creatinine, Ser: 1.65 mg/dL — ABNORMAL HIGH (ref 0.61–1.24)
Creatinine, Ser: 1.74 mg/dL — ABNORMAL HIGH (ref 0.61–1.24)
Creatinine, Ser: 1.85 mg/dL — ABNORMAL HIGH (ref 0.61–1.24)
GFR, Estimated: 50 mL/min — ABNORMAL LOW (ref >60)
GFR, Estimated: 47 mL/min — ABNORMAL LOW (ref >60)
GFR, Estimated: 44 mL/min — ABNORMAL LOW (ref >60)
Glucose, Bld: 142 mg/dL — ABNORMAL HIGH (ref 70–99)
Glucose, Bld: 131 mg/dL — ABNORMAL HIGH (ref 70–99)
Glucose, Bld: 153 mg/dL — ABNORMAL HIGH (ref 70–99)
Potassium: 3.5 mmol/L (ref 3.5–5.1)
Potassium: 3.9 mmol/L (ref 3.5–5.1)
Potassium: 3.6 mmol/L (ref 3.5–5.1)
Sodium: 136 mmol/L (ref 135–145)
Sodium: 137 mmol/L (ref 135–145)
Sodium: 137 mmol/L (ref 135–145)

## 2020-01-15 LAB — BPAM RBC
Blood Product Expiration Date: 202201092359
Blood Product Expiration Date: 202201102359
Blood Product Expiration Date: 202201102359
Blood Product Expiration Date: 202201102359
Blood Product Expiration Date: 202201112359
Blood Product Expiration Date: 202201112359
Blood Product Expiration Date: 202201112359
Blood Product Expiration Date: 202201112359
Blood Product Expiration Date: 202201132359
Blood Product Expiration Date: 202201132359
Blood Product Expiration Date: 202201132359
Blood Product Expiration Date: 202201132359
ISSUE DATE / TIME: 202112152044
ISSUE DATE / TIME: 202112152044
ISSUE DATE / TIME: 202112160446
ISSUE DATE / TIME: 202112160802
ISSUE DATE / TIME: 202112160802
ISSUE DATE / TIME: 202112161212
ISSUE DATE / TIME: 202112161245
ISSUE DATE / TIME: 202112162057
ISSUE DATE / TIME: 202112170825
ISSUE DATE / TIME: 202112171324
ISSUE DATE / TIME: 202112181129
Unit Type and Rh: 6200
Unit Type and Rh: 6200
Unit Type and Rh: 6200
Unit Type and Rh: 6200
Unit Type and Rh: 6200
Unit Type and Rh: 6200
Unit Type and Rh: 6200
Unit Type and Rh: 6200
Unit Type and Rh: 6200
Unit Type and Rh: 6200
Unit Type and Rh: 6200
Unit Type and Rh: 6200

## 2020-01-15 LAB — GLUCOSE, CAPILLARY
Glucose-Capillary: 154 mg/dL — ABNORMAL HIGH (ref 70–99)
Glucose-Capillary: 137 mg/dL — ABNORMAL HIGH (ref 70–99)
Glucose-Capillary: 128 mg/dL — ABNORMAL HIGH (ref 70–99)
Glucose-Capillary: 133 mg/dL — ABNORMAL HIGH (ref 70–99)
Glucose-Capillary: 115 mg/dL — ABNORMAL HIGH (ref 70–99)
Glucose-Capillary: 118 mg/dL — ABNORMAL HIGH (ref 70–99)
Glucose-Capillary: 118 mg/dL — ABNORMAL HIGH (ref 70–99)
Glucose-Capillary: 123 mg/dL — ABNORMAL HIGH (ref 70–99)
Glucose-Capillary: 125 mg/dL — ABNORMAL HIGH (ref 70–99)
Glucose-Capillary: 138 mg/dL — ABNORMAL HIGH (ref 70–99)
Glucose-Capillary: 140 mg/dL — ABNORMAL HIGH (ref 70–99)
Glucose-Capillary: 144 mg/dL — ABNORMAL HIGH (ref 70–99)
Glucose-Capillary: 157 mg/dL — ABNORMAL HIGH (ref 70–99)
Glucose-Capillary: 111 mg/dL — ABNORMAL HIGH (ref 70–99)
Glucose-Capillary: 140 mg/dL — ABNORMAL HIGH (ref 70–99)

## 2020-01-15 LAB — POCT I-STAT 7, (LYTES, BLD GAS, ICA,H+H)
Acid-Base Excess: 1 mmol/L (ref 0.0–2.0)
Bicarbonate: 26.7 mmol/L (ref 20.0–28.0)
Calcium, Ion: 1.23 mmol/L (ref 1.15–1.40)
HCT: 27 % — ABNORMAL LOW (ref 39.0–52.0)
Hemoglobin: 9.2 g/dL — ABNORMAL LOW (ref 13.0–17.0)
O2 Saturation: 98 %
Patient temperature: 37.9
Potassium: 4 mmol/L (ref 3.5–5.1)
Sodium: 138 mmol/L (ref 135–145)
TCO2: 28 mmol/L (ref 22–32)
pCO2 arterial: 49.1 mmHg — ABNORMAL HIGH (ref 32.0–48.0)
pH, Arterial: 7.348 — ABNORMAL LOW (ref 7.350–7.450)
pO2, Arterial: 120 mmHg — ABNORMAL HIGH (ref 83.0–108.0)

## 2020-01-15 LAB — MAGNESIUM
Magnesium: 1.8 mg/dL (ref 1.7–2.4)
Magnesium: 1.6 mg/dL — ABNORMAL LOW (ref 1.7–2.4)

## 2020-01-15 LAB — LACTIC ACID, PLASMA
Lactic Acid, Venous: 1.1 mmol/L (ref 0.5–1.9)
Lactic Acid, Venous: 1.1 mmol/L (ref 0.5–1.9)

## 2020-01-15 LAB — BPAM PLATELET PHERESIS
Blood Product Expiration Date: 202112162359
Blood Product Expiration Date: 202112162359
ISSUE DATE / TIME: 202112150941
ISSUE DATE / TIME: 202112152218
Unit Type and Rh: 5100
Unit Type and Rh: 6200

## 2020-01-15 LAB — HEPARIN LEVEL (UNFRACTIONATED): Heparin Unfractionated: <0.1 IU/mL — ABNORMAL LOW (ref 0.30–0.70)

## 2020-01-15 LAB — PHOSPHORUS
Phosphorus: 4.1 mg/dL (ref 2.5–4.6)
Phosphorus: 3.7 mg/dL (ref 2.5–4.6)

## 2020-01-15 LAB — LACTATE DEHYDROGENASE: LDH: 468 U/L — ABNORMAL HIGH (ref 98–192)

## 2020-01-15 LAB — PROCALCITONIN: Procalcitonin: 8.65 ng/mL

## 2020-01-15 MED ORDER — METOLAZONE 5 MG PO TABS: 5.0000 mg | ORAL_TABLET | Freq: Two times a day (BID) | ORAL | Status: DC

## 2020-01-15 MED ORDER — VANCOMYCIN HCL 750 MG/150ML IV SOLN
750.0000 mg | Freq: Two times a day (BID) | INTRAVENOUS | Status: DC
  Administered 2020-01-15 – 2020-01-16 (×2): 750 mg via INTRAVENOUS
  Filled 2020-01-15 (×3): qty 150

## 2020-01-15 MED ORDER — POTASSIUM CHLORIDE CRYS ER 20 MEQ PO TBCR: 40.0000 meq | EXTENDED_RELEASE_TABLET | Freq: Two times a day (BID) | ORAL | Status: DC

## 2020-01-15 MED ORDER — METOLAZONE 5 MG PO TABS
5.0000 mg | ORAL_TABLET | Freq: Two times a day (BID) | ORAL | Status: DC
  Administered 2020-01-15 – 2020-01-17 (×5): 5 mg
  Filled 2020-01-15 (×4): qty 1

## 2020-01-15 MED ORDER — POTASSIUM CHLORIDE 10 MEQ/50ML IV SOLN
10.0000 meq | INTRAVENOUS | Status: AC
  Administered 2020-01-15 (×3): 10 meq via INTRAVENOUS

## 2020-01-15 MED ORDER — OXYCODONE HCL 5 MG PO TABS: 5.0000 mg | ORAL_TABLET | ORAL | Status: DC | PRN

## 2020-01-15 MED ORDER — POTASSIUM CHLORIDE 20 MEQ PO PACK
40.0000 meq | PACK | Freq: Two times a day (BID) | ORAL | Status: DC
  Administered 2020-01-15 (×2): 40 meq
  Filled 2020-01-15 (×3): qty 2

## 2020-01-15 MED ORDER — MAGNESIUM SULFATE 2 GM/50ML IV SOLN
2.0000 g | Freq: Once | INTRAVENOUS | Status: AC
  Administered 2020-01-15: 20:00:00 2 g via INTRAVENOUS
  Filled 2020-01-15: qty 50

## 2020-01-15 NOTE — Progress Notes (Signed)
[place] for heparin Indication: Impella 5.5  Allergies  Allergen Reactions  . Orange Fruit Anaphylaxis, Hives and Other (See Comments)    Per Pt- Blisters around lips and Hives all over, also  . Penicillins Anaphylaxis    Did it involve swelling of the face/tongue/throat, SOB, or low BP? Yes Did it involve sudden or severe rash/hives, skin peeling, or any reaction on the inside of your mouth or nose? Yes Did you need to seek medical attention at a hospital or doctor's office? No When did it last happen?childhood If all above answers are "NO", may proceed with cephalosporin use.  [name] [Empagliflozin] Itching  . [name] [Insulin Glargine] Nausea And Vomiting    Patient Measurements: Height: 6\' 3"  (190.5 cm) Weight: 129.2 kg (284 lb 13.4 oz) IBW/kg (Calculated) : 84.5 Heparin dosing wt: 101 kg  Vital Signs: Temp: 100.04 F (37.8 C) (12/18 0700) Temp Source: Core (12/17 2000) Pulse Rate: 110 (12/18 0700)  Labs: Recent Labs    01/23/2020 1518 01/20/2020 1536 01/15/2020 0340 01/21/2020 0533 01/26/2020 1041 01/18/2020 1237 01/14/20 1009 01/14/20 1700 01/14/20 1722 01/15/20 0100 01/15/20 0513 01/15/20 0519 01/15/20 0622  HGB 9.1*   < > 7.5*   < >  --    < >  --  9.4*   < > 9.3*  --  9.2* 9.6*  HCT 28.0*   < > 22.3*   < >  --    < >  --  27.6*   < > 27.4*  --  27.0* 27.2*  PLT 231   < > 192   < >  --    < >  --  152  --  165  --   --  173  APTT 40*  --   --   --   --   --   --   --   --   --   --   --   --   LABPROT 19.5*  --  20.8*  --  21.5*  --   --   --   --   --   --   --   --   INR 1.7*  --  1.9*  --  1.9*  --   --   --   --   --   --   --   --   HEPARINUNFRC  --   --   --   --   --   --  <0.10*  --   --   --  <0.10*  --   --   CREATININE  --    < > 1.62*  --   --    < >  --  1.70*  --  1.65*  --   --  1.74*   < > = values in this interval not displayed.    Estimated Creatinine Clearance: 72.7 mL/min (A) (by C-G  formula based on SCr of 1.74 mg/dL (H)).   Assessment: 49 yo male s/p impella 5.5 placement. Pt started on heparinized purge and systemic heparin. Pt now s/p CABG 12/15 with Impella remaining in place c/b significant bleeding and hematoma requiring opened chest and reexploration in OR 12/16.  Impella on P6 with stable pressures. Heparin running via purge only at this time, purge flow rate 8.1 ml/hr (approx 405 units/hr heparin). Heparin level undetectable as expected, CBC low but stable.  Goal of Therapy:  Heparin level 0.2-0.5 units/ml Monitor platelets by  anticoagulation protocol: Yes   Plan:  Continue heparin via the purge Will follow with CVTS regarding systemic heparin once appropriate to initiate Daily heparin level for now  Richardine Service, PharmD, [place] PGY2 Cardiology Pharmacy Resident Phone: [phone] 01/15/2020  7:56 AM  Please check [email or web address] for unit-specific pharmacy phone numbers.

## 2020-01-15 NOTE — Progress Notes (Signed)
EVENING ROUNDS NOTE :     [address]       [place] [address]             [phone]                 2 Days Post-Op Procedure(s) (LRB): RE-EXPLORATION POST OPERATIVE OPEN HEART (N/A) TRANSESOPHAGEAL ECHOCARDIOGRAM (TEE) (N/A)  Total Length of Stay:  LOS: 16 days  BP 94/62 Comment: fem aline  Pulse (!) 103   Temp (!) 101.3 F (38.5 C) (Core)   Resp (!) 24   Ht 6\' 3"  (1.905 m)   Wt 129.2 kg   SpO2 97%   BMI 35.60 kg/m   .Intake/Output      12/18 0701 12/19 0700   I.V. (mL/kg) 1468.6 (11.4)   Blood    Other 104.5   NG/GT 392.2   IV Piggyback 262.2   Total Intake(mL/kg) 2227.4 (17.2)   Urine (mL/kg/hr) 1908 (1.1)   Chest Tube 380   Total Output 2288   Net -60.6         . sodium chloride Stopped (01/27/2020 1646)  . sodium chloride 10 mL/hr at 01/14/20 2101  . sodium chloride 10 mL/hr at 01/15/20 2000  . amiodarone 30 mg/hr (01/15/20 2000)  . epinephrine 4 mcg/min (01/15/20 2000)  . fentaNYL infusion INTRAVENOUS 100 mcg/hr (01/15/20 2000)  . furosemide (LASIX) 200 mg in dextrose 5% 100 mL (2mg /mL) infusion 12 mg/hr (01/15/20 2000)  . impella catheter heparin 50 unit/mL in dextrose 5%    . insulin 2.8 mL/hr at 01/15/20 2000  . lactated ringers 20 mL/hr at 01/15/20 2000  . meropenem (MERREM) IV Stopped (01/15/20 1416)  . midazolam Stopped (01/15/20 1301)  . milrinone 0.125 mcg/kg/min (01/15/20 2000)  . norepinephrine (LEVOPHED) Adult infusion 14 mcg/min (01/15/20 2000)  . vancomycin Stopped (01/15/20 1711)  . vasopressin 0.04 Units/min (01/15/20 2000)     Lab Results  Component Value Date   WBC 17.7 (H) 01/15/2020   HGB 8.6 (L) 01/15/2020   HCT 26.4 (L) 01/15/2020   PLT 171 01/15/2020   GLUCOSE 153 (H) 01/15/2020   CHOL 176 10/01/2018   TRIG 126 10/01/2018   HDL 25 (L) 10/01/2018   LDLDIRECT 116.5 11/01/2011   LDLCALC 126 (H) 10/01/2018   ALT 171 (H) 01/14/2020   AST 264 (H) 01/14/2020   NA 137 01/15/2020   K 3.6 01/15/2020   CL  104 01/15/2020   CREATININE 1.85 (H) 01/15/2020   BUN 32 (H) 01/15/2020   CO2 24 01/15/2020   TSH 3.009 10/01/2018   INR 1.9 (H) 01/27/2020   HGBA1C 10.6 (H) 01/03/2020   Cr continues to rise slowly 1.85 Drips unchanged during day    Grace Isaac MD  Beeper [phone] Office [phone] 01/15/2020 8:46 PM

## 2020-01-15 NOTE — Progress Notes (Signed)
Patient ID: Corey Palmer, male   DOB: [birth year], 49 y.o.   MRN: [id number]     Advanced Heart Failure Rounding Note  PCP-Cardiologist: No primary care provider on file.   Subjective:    - 12/7 Torsades -ICD shock x1.  - 12/11 Impella 5.5 placed.  - 12/12 Impella repositioned in OR - 12/15 CABG x 4 with LIMA-LAD, SVG-PDA/PLV, SVG-OM - 12/16 DCCV afib.  Back to OR to re-open chest and again to evacuate hematoma.   Intubated/sedated. Chest open. NE 40 -> 18. On epi 4, milrinone and VP   Diuresing well on lasix gtt at 12 but I/Os positive. Impella turned down to P-6 overnight   Remains on meropenem and vancomycin.   [place] numbers: CVP 16 PA 39/24 Co/CI 5.2/2.3 Co-ox 56% PAPi 1.0  Impella 5.5 P6 Flow 3.6 Heparin only in purge  Echo (12/10): EF <20%, moderate LV dilation, normal RV size with mildly decreased systolic function.   RHC/LHC: Coronary Findings   Diagnostic Dominance: Right  Left Main  30% distal left main stenosis.  Left Anterior Descending  40% ostial LAD stenosis. 90% stenosis proximal LAD just distal to D1 take-off. Large D1 with 99% proximal stenosis, 60% mid vessel stenosis.  Left Circumflex  Long up to 70% proximal LCx stenosis.  Right Coronary Artery  95% mid RCA stenosis. 75% distal RCA stenosis. 60% stenosis proximal PLV. 99% stenosis mid PDA.   Intervention   No interventions have been documented.  Right Heart  Right Heart Pressures RHC Procedural Findings: Hemodynamics (mmHg) RA mean 16 RV 53/12 PA 65/29, mean 46 PCWP mean 31 LV 119/28 AO 118/87  Oxygen saturations: PA 45% AO 93%  Cardiac Output (Fick) 3.39  Cardiac Index (Fick) 1.4 PVR 4.4 WU PAPi 2.25     Objective:   Weight Range: 129.2 kg Body mass index is 35.6 kg/m.   Vital Signs:   Temp:  [99.68 F (37.6 C)-100.6 F (38.1 C)] 100.6 F (38.1 C) (12/18 1030) Pulse Rate:  [45-118] 107 (12/18 1030) Resp:  [20-34] 20 (12/18 1030) BP: (96-123)/(74-77) 123/74  (12/17 1927) SpO2:  [96 %-100 %] 98 % (12/18 1030) Arterial Line BP: (79-108)/(57-77) 94/71 (12/18 1030) FiO2 (%):  [40 %] 40 % (12/18 0800) Weight:  [129.2 kg] 129.2 kg (12/18 0500) Last BM Date: 01/11/2020  Weight change: Filed Weights   12/31/2019 0400 01/14/20 0545 01/15/20 0500  Weight: 122.2 kg 125 kg 129.2 kg    Intake/Output:   Intake/Output Summary (Last 24 hours) at 01/15/2020 1122 Last data filed at 01/15/2020 1100 Gross per 24 hour  Intake 4270.78 ml  Output 3900 ml  Net 370.78 ml      Physical Exam   General:  Sedated on vent. Chest open HEENT: normal + ETT Neck: supple. RIJ swan  Cor: chest open. Regular + CTs Lungs: coarse Abdomen: obese  soft, nontender, nondistended. Hypoactive bowel sounds. Extremities: no cyanosis, clubbing, rash, 2+ edema Neuro: intubated/sedated  Telemetry    Atrial fibrillation 100-110s, personally reviewed  Labs    CBC Recent Labs    01/15/20 0100 01/15/20 0519 01/15/20 0622  WBC 19.0*  --  20.3*  HGB 9.3* 9.2* 9.6*  HCT 27.4* 27.0* 27.2*  MCV 84.3  --  84.2  PLT 165  --  546   Basic Metabolic Panel Recent Labs    01/14/20 1700 01/14/20 1722 01/15/20 0100 01/15/20 0519 01/15/20 0622  NA 136   < > 136 138 137  K 3.9   < > 3.5  4.0 3.9  CL 104  --  103  --  103  CO2 23  --  22  --  24  GLUCOSE 121*  --  142*  --  131*  BUN 27*  --  30*  --  30*  CREATININE 1.70*  --  1.65*  --  1.74*  CALCIUM 8.2*  --  8.2*  --  8.2*  MG 1.8  --  1.8  --   --   PHOS 4.3  --  4.1  --   --    < > = values in this interval not displayed.   Liver Function Tests Recent Labs    01/11/2020 1022 01/14/20 1700  AST 173* 264*  ALT 85* 171*  ALKPHOS 73 88  BILITOT 1.6* 1.6*  PROT 4.3* 4.7*  ALBUMIN 2.4* 2.3*   No results for input(s): LIPASE, AMYLASE in the last 72 hours. Cardiac Enzymes No results for input(s): CKTOTAL, CKMB, CKMBINDEX, TROPONINI in the last 72 hours.  BNP: BNP (last 3 results) Recent Labs     01/02/2020 1619  BNP 577.5*    ProBNP (last 3 results) No results for input(s): PROBNP in the last 8760 hours.   D-Dimer No results for input(s): DDIMER in the last 72 hours. Hemoglobin A1C No results for input(s): HGBA1C in the last 72 hours. Fasting Lipid Panel No results for input(s): CHOL, HDL, LDLCALC, TRIG, CHOLHDL, LDLDIRECT in the last 72 hours. Thyroid Function Tests No results for input(s): TSH, T4TOTAL, T3FREE, THYROIDAB in the last 72 hours.  Invalid input(s): FREET3  Other results:   Imaging    No results found.   Medications:     Scheduled Medications: . acetaminophen  1,000 mg Oral Q6H   Or  . acetaminophen (TYLENOL) oral liquid 160 mg/5 mL  1,000 mg Per Tube Q6H  . aspirin  300 mg Rectal Daily  . atorvastatin  80 mg Per Tube Daily  . bisacodyl  10 mg Oral Daily   Or  . bisacodyl  10 mg Rectal Daily  . busPIRone  10 mg Per Tube TID  . chlorhexidine gluconate (MEDLINE KIT)  15 mL Mouth Rinse BID  . Chlorhexidine Gluconate Cloth  6 each Topical Daily  . docusate  100 mg Per Tube BID  . feeding supplement (PIVOT 1.5 CAL)  1,000 mL Per Tube Q24H  . fentaNYL (SUBLIMAZE) injection  25 mcg Intravenous Once  . FLUoxetine  40 mg Per Tube BID  . mouth rinse  15 mL Mouth Rinse 10 times per day  . pantoprazole sodium  40 mg Per Tube Daily  . polyethylene glycol  17 g Per Tube Daily  . sodium chloride flush  10-40 mL Intracatheter Q12H  . sodium chloride flush  3 mL Intravenous Q12H  . valproic acid  250 mg Per Tube BID    Infusions: . sodium chloride Stopped (01/17/2020 1646)  . sodium chloride 10 mL/hr at 01/14/20 2101  . sodium chloride 10 mL/hr at 01/15/20 0900  . amiodarone 30 mg/hr (01/15/20 0900)  . epinephrine 4 mcg/min (01/15/20 0900)  . fentaNYL infusion INTRAVENOUS 150 mcg/hr (01/15/20 0900)  . furosemide (LASIX) 200 mg in dextrose 5% 100 mL (5m/mL) infusion 12 mg/hr (01/15/20 0900)  . impella catheter heparin 50 unit/mL in dextrose 5%    .  insulin 1.7 mL/hr at 01/15/20 0900  . lactated ringers 20 mL/hr at 01/15/20 0900  . meropenem (MERREM) IV Stopped (01/15/20 0657)  . midazolam 2 mg/hr (01/15/20 0900)  .  milrinone 0.125 mcg/kg/min (01/15/20 0900)  . norepinephrine (LEVOPHED) Adult infusion 15 mcg/min (01/15/20 0900)  . vancomycin    . vasopressin 0.04 Units/min (01/15/20 1038)    PRN Medications: sodium chloride, sodium chloride, dextrose, fentaNYL, fentaNYL (SUBLIMAZE) injection, fentaNYL (SUBLIMAZE) injection, metoprolol tartrate, midazolam, midazolam, midazolam, ondansetron (ZOFRAN) IV, oxyCODONE, sodium chloride flush, sodium chloride flush     Assessment/Plan   1. Cardiogenic shock/acute on chronic systolic CHF: Initially nonischemic cardiomyopathy. Possible familial cardiomyopathy as both parents had cardiomyopathy and died at around 9.However, [employer] gene testing did not show any common mutation for cardiomyopathy.  However, this admission noted to have severe 3 vessel disease so suspect component of ischemic cardiomyopathy. [employer] ICD. Echo in 8/20 with EF 15% and mildly decreased RV function. Echo this admission with EF < 20%, moderate LV dilation, RV mildly reduced, severe LAE, no significant MR.Low output HF with markedly low EF.  He has a long history of cardiomyopathy (20 yrs), tends to minimize symptoms.  Cardiorenal syndrome with creatinine up to 2.2,  stabilized on milrinone and Impella 5.5. SCr 1.44 day of CABG. CABG 12/15.  Post-op shock, back to OR 12/16 to open chest (chest wall compartment syndrome) and again to evacuate hematoma.  TEE post-op with severe RV dysfunction. MAP more stable this morning. NE coming down Main issue now is volume removal as he is close to 50+ pounds over pre-op weight - Continue lasix gtt. Add metolazone 5 bid today - Continue Impella P6 (no further suction alarms). Waveforms and position ok. LDH 412 -> 468. Would not wean further until volume off. Can go back to P-8 as  tolerated to support diuresis   2. Atrial fibrillation: H/o PAF.  He was on dofetilide in the remote past but this was stopped due to noncompliance. He had an upper GI bleed from antral ulcers in 3/12. He was seen by GI and was deemed safe to restart anticoagulation as long as he remains on a PPI.  No apparent recurrence of AF until just prior to this admission, was cardioverted back to NSR in ER but went back into atrial fibrillation.  Post-op afib, DCCV to NSR/BiV pacing am 12/16, now back in atrial fibrillation  - Continue amiodarone at 30 mg/hr. - Would not try to cardiovert again until back on heparin gtt and vasoactive meds weaned.  - Off heparin post-op with bleeding, only getting heparin in Impella purge currently.  Restart systemic heparin gtt when surgery thinks safe to do so. No change - Unable to do Maze with CABG.   3. CAD:   Cath this admission with severe 3 vessel disease, ideally treated by CABG given extent of disease. CABG x 4 on 12/15.  - Continue atorvastatin, ASA.  - continue current management 4. Acute on chronic hypoxemic respiratory failure: Pulmonary edema, vent per CCM.  - d/w Dr. Lamonte Sakai  5. AKI on CKD stage ?3: Creatinine up slightly 1.6-> 1.7 watch with diuresis   6. Diabetes: Insulin.  7. Hyponatremia: Resolved.  8. Torsades/ICD shock: 12/7/212 in hospital event, in setting of severe hypokalemia and hypomagnesemia.   9. Gout: h/o gout. Complains of rt knee pain c/w previous flares - treated w/ prednisone burst (completed).  10: ID: Tm 100.6, PCT 13.46 -> 8.65  Cultures NGTD.  - Covering with vancomycin/meropenem (PCN allergy).   CRITICAL CARE Performed by: Glori Bickers  Total critical care time: 40 minutes  Critical care time was exclusive of separately billable procedures and treating other patients.  Critical care was  necessary to treat or prevent imminent or life-threatening deterioration.  Critical care was time spent personally by me on the following  activities: development of treatment plan with patient and/or surrogate as well as nursing, discussions with consultants, evaluation of patient's response to treatment, examination of patient, obtaining history from patient or surrogate, ordering and performing treatments and interventions, ordering and review of laboratory studies, ordering and review of radiographic studies, pulse oximetry and re-evaluation of patient's condition.   Glori Bickers MD 01/15/2020 11:22 AM

## 2020-01-15 NOTE — Progress Notes (Signed)
NAME:  Corey Palmer, MRN:  [id number], DOB:  12-07-1970, LOS: 76 ADMISSION DATE:  12/30/2019, CONSULTATION DATE: 01/20/2020 REFERRING MD: Aundra Dubin, CHIEF COMPLAINT: Respiratory failure post Impella  HPI/course in hospital  49 year old man underwent Impella 5.5 insertion 12/11 for persistent symptoms of forward failure with low cardiac indices.  He presented last week for ICD shocks and was found to be in rapid atrial fibrillation for which he was cardioverted.  He was also found to be in decompensated heart failure with an ejection fraction of 15% and multivessel coronary disease on left heart catheterization.  He was started on tropes and diuresed which improved his congestive symptoms but his index remained low so an Impella was placed to optimize his hemodynamics prior to a high risk redo-bypass.  Uneventful Impella implantation.  Patient returned from operating room intubated.  He had to go back to the OR for displacement of Impella 12/12.  Underwent CABGx4 on 12/15.  Returned to ICU on significant inotropic support and intubated.  Past Medical History   Past Medical History:  Diagnosis Date  . Anginal pain ([place])   . Anxiety   . Arthritis   . Automatic implantable cardioverter-defibrillator in situ 2007   [employer]   . Bipolar disorder ([place])   . CAD (coronary artery disease)    Cardiac cath (08/19/2000) - Nonobstructive, 40% stenosis of LAD,.   . Cancer [facility]) 2013   benign stomach cancer  . CHF (congestive heart failure) ([place])   . Chondrocalcinosis of right knee 06/11/2013  . Chronic systolic heart failure ([place])    2D echo (01/270) - Systolic function severely reduced., LV EF 20-25%. Akinesis of apical and anteroseptal mycoardium.  last 2D echo (11/2008 ), LV EF 25-30%, diffuse hypokinesis, and grade 1 diastolic dysfunction.  . Depression   . Diabetes uncomplicated adult-type II    on insulin therapy, a1c 12.9 in 01/2011  . Dilated cardiomyopathy ([place])    status post ICD  placement in 2008 c/b tearing at the atrial junction resulting in tamponade and urgent thoracotomy  . Dyslipidemia   . Dysrhythmia   . GERD (gastroesophageal reflux disease)   . Gout, unspecified   . Hepatic steatosis 2011   seen on ultrasound.   . History of kidney stones   . Hx of echocardiogram    Echo (04/2013):  Mild LVH, EF 20-25%, mild LAE, mild to mod RVE with mild reduced RVSF.  Marland Kitchen Hypertension   . Obesity (BMI 30.0-34.9)   . Pacemaker   . Paroxysmal atrial fibrillation (HCC)    on chronic coumadin, goal INR 2-3. status post direct current  . Peptic ulcer   . Shortness of breath   . Sleep apnea    does not have CPAP     Past Surgical History:  Procedure Laterality Date  . BEDSIDE EVACUATION OF HEMATOMA N/A 01/12/2020   Procedure: CHEST EXPLORATION @2H12 ;  Surgeon: Ivin Poot, MD;  Location: [place];  Service: Cardiovascular;  Laterality: N/A;  . BEDSIDE EVACUATION OF HEMATOMA N/A 01/05/2020   Procedure: CHEST EXPLORATION @2H12 ;  Surgeon: [name], MD;  Location: [place];  Service: Open Heart Surgery;  Laterality: N/A;  . BIOPSY  12/01/2019   Procedure: BIOPSY;  Surgeon: Arta Silence, MD;  Location: [facility];  Service: Endoscopy;;  . CARDIAC CATHETERIZATION    . CARDIAC PACEMAKER PLACEMENT    . CHOLECYSTECTOMY    . COLONOSCOPY    . CORONARY ARTERY BYPASS GRAFT    . CORONARY ARTERY BYPASS GRAFT N/A  01/02/2020   Procedure: CORONARY ARTERY BYPASS GRAFTING (CABG), ON PUMP, TIMES FOUR, USING LEFT INTERNAL MAMMARY ARTERY AND ENDOSCOPICALLY HARVESTED RIGHT GREATER SAPHENOUS VEIN;  Surgeon: [name], MD;  Location: [place];  Service: Open Heart Surgery;  Laterality: N/A;  . ESOPHAGOGASTRODUODENOSCOPY  04/05/2011   Procedure: ESOPHAGOGASTRODUODENOSCOPY (EGD);  Surgeon: Irene Shipper, MD;  Location[facility] ENDOSCOPY;  Service: Endoscopy;  Laterality: N/A;  . ESOPHAGOGASTRODUODENOSCOPY (EGD) WITH PROPOFOL N/A 12/01/2019   Procedure: ESOPHAGOGASTRODUODENOSCOPY (EGD)  WITH PROPOFOL;  Surgeon: Arta Silence, MD;  Location: [facility];  Service: Endoscopy;  Laterality: N/A;  . EXPLORATION POST OPERATIVE OPEN HEART N/A 01/19/2020   Procedure: RE-EXPLORATION POST OPERATIVE OPEN HEART;  Surgeon: [name], MD;  Location: [place];  Service: Open Heart Surgery;  Laterality: N/A;  . JOINT REPLACEMENT     left knee replacement  . KNEE ARTHROSCOPY Right 06/11/2013   Procedure: RIGHT KNEE ARTHROSCOPY with chondroplasty;  Surgeon: Johnny Bridge, MD;  Location: [place];  Service: Orthopedics;  Laterality: Right;  . PLACEMENT OF IMPELLA LEFT VENTRICULAR ASSIST DEVICE N/A 01/17/2020   Procedure: PLACEMENT OF IMPELLA LEFT VENTRICULAR ASSIST DEVICE;  Surgeon: [name], MD;  Location: [place];  Service: Open Heart Surgery;  Laterality: N/A;  . PLACEMENT OF IMPELLA LEFT VENTRICULAR ASSIST DEVICE Right 01/17/2020   Procedure: AXILLARY  ARTERY PLACEMENT OF ABIOMED IMPELLA  5.5 LEFT VENTRICULAR ASSIST DEVICE;  Surgeon: [name], MD;  Location: [place];  Service: Open Heart Surgery;  Laterality: Right;  . RIGHT/LEFT HEART CATH AND CORONARY ANGIOGRAPHY N/A 01/09/2020   Procedure: RIGHT/LEFT HEART CATH AND CORONARY ANGIOGRAPHY;  Surgeon: Larey Dresser, MD;  Location: [place] CV LAB;  Service: Cardiovascular;  Laterality: N/A;  . TEE WITHOUT CARDIOVERSION N/A 01/14/2020   Procedure: TRANSESOPHAGEAL ECHOCARDIOGRAM (TEE);  Surgeon: [name], MD;  Location: [place];  Service: Open Heart Surgery;  Laterality: N/A;  . TEE WITHOUT CARDIOVERSION N/A 01/28/2020   Procedure: TRANSESOPHAGEAL ECHOCARDIOGRAM (TEE);  Surgeon: [name], MD;  Location: [place];  Service: Open Heart Surgery;  Laterality: N/A;  . TOTAL KNEE ARTHROPLASTY Left       Interim history/subjective:     Core track tube placed 12/17 DId interact this morning 12/18 Levophed 17, vasopressin 0.04, epinephrine 4, milrinone 0.125 Lasix 12 mg/h, notes some increase in serum  creatinine Chest remains open   Objective   Blood pressure 123/74, pulse 98, temperature 100.22 F (37.9 C), temperature source Core, resp. rate (!) 24, height 6\' 3"  (1.905 m), weight 129.2 kg, SpO2 98 %. PAP: (31-44)/(14-28) 39/24 CVP:  [11 mmHg-21 mmHg] 15 mmHg CO:  [4.8 L/min-5.3 L/min] 5.2 L/min CI:  [2.1 L/min/m2-2.3 L/min/m2] 2.3 L/min/m2  Vent Mode: PRVC FiO2 (%):  [40 %] 40 % Set Rate:  [24 bmp] 24 bmp Vt Set:  [510 mL] 510 mL PEEP:  [5 cmH20] 5 cmH20 Plateau Pressure:  [23 cmH20-25 cmH20] 24 cmH20   Intake/Output Summary (Last 24 hours) at 01/15/2020 0923 Last data filed at 01/15/2020 0900 Gross per 24 hour  Intake 5229.08 ml  Output 4200 ml  Net 1029.08 ml   Filed Weights   12/31/2019 0400 01/14/20 0545 01/15/20 0500  Weight: 122.2 kg 125 kg 129.2 kg   PAP: (31-44)/(14-28) 39/24 CVP:  [11 mmHg-21 mmHg] 15 mmHg CO:  [4.8 L/min-5.3 L/min] 5.2 L/min CI:  [2.1 L/min/m2-2.3 L/min/m2] 2.3 L/min/m2  Examination: Physical exam: General: Ill-appearing man, intubated, sedated HEENT: ET tube in good position, OG tube in  position, no oral lesions Neuro: Sedated, does not wake to voice, does grimace, moves stimulation Chest: Median sternotomy, regular, Impella sounds audible, chest tubes in place, clear Abdomen: Nondistended, hypoactive bowel sounds Skin: Cool extremities, no rash  Ancillary tests (personally reviewed)  CBC: Recent Labs  Lab 01/18/2020 2321 01/14/20 0406 01/14/20 0428 01/14/20 1700 01/14/20 1722 01/15/20 0100 01/15/20 0519 01/15/20 0622  WBC 17.4* 17.0*  --  18.4*  --  19.0*  --  20.3*  HGB 8.5* 8.4*   < > 9.4* 9.2* 9.3* 9.2* 9.6*  HCT 25.3* 24.4*   < > 27.6* 27.0* 27.4* 27.0* 27.2*  MCV 85.5 84.1  --  84.1  --  84.3  --  84.2  PLT 146* 150  --  152  --  165  --  173   < > = values in this interval not displayed.    Basic Metabolic Panel: Recent Labs  Lab 01/11/2020 0340 01/18/2020 0533 01/22/2020 1315 01/07/2020 1450 01/12/2020 1655  01/18/2020 1720 01/14/20 0406 01/14/20 0428 01/14/20 1700 01/14/20 1722 01/15/20 0100 01/15/20 0519 01/15/20 0622  NA 133*   < > 133*   < > 135   < > 136   < > 136 137 136 138 137  K 4.2   < > 4.2   < > 4.2   < > 4.2   < > 3.9 3.9 3.5 4.0 3.9  CL 102  --  100   < > 103  --  104  --  104  --  103  --  103  CO2 19*  --  20*  --  22  --  22  --  23  --  22  --  24  GLUCOSE 126*  --  156*   < > 122*  --  110*  --  121*  --  142*  --  131*  BUN 25*  --  26*   < > 26*  --  25*  --  27*  --  30*  --  30*  CREATININE 1.62*  --  1.82*   < > 1.79*  --  1.61*  --  1.70*  --  1.65*  --  1.74*  CALCIUM 8.2*  --  8.3*  --  8.1*  --  8.2*  --  8.2*  --  8.2*  --  8.2*  MG 2.5*  --  2.3  --  2.1  --   --   --  1.8  --  1.8  --   --   PHOS  --   --   --   --   --   --   --   --  4.3  --  4.1  --   --    < > = values in this interval not displayed.   GFR: Estimated Creatinine Clearance: 72.7 mL/min (A) (by C-G formula based on SCr of 1.74 mg/dL (H)). Recent Labs  Lab 01/17/2020 1022 01/22/2020 1655 01/10/2020 2321 01/14/20 0406 01/14/20 1700 01/15/20 0100 01/15/20 0513 01/15/20 0622  PROCALCITON 15.15  --   --  13.46  --  8.65  --   --   WBC 16.7* 21.5*   < > 17.0* 18.4* 19.0*  --  20.3*  LATICACIDVEN  --  1.6  --  1.2 1.3  --  1.1  --    < > = values in this interval not displayed.    Liver Function Tests: Recent Labs  Lab 01/01/2020 1022  01/14/20 1700  AST 173* 264*  ALT 85* 171*  ALKPHOS 73 88  BILITOT 1.6* 1.6*  PROT 4.3* 4.7*  ALBUMIN 2.4* 2.3*   No results for input(s): LIPASE, AMYLASE in the last 168 hours. No results for input(s): AMMONIA in the last 168 hours.  ABG    Component Value Date/Time   PHART 7.348 (L) 01/15/2020 0519   PCO2ART 49.1 (H) 01/15/2020 0519   PO2ART 120 (H) 01/15/2020 0519   HCO3 26.7 01/15/2020 0519   TCO2 28 01/15/2020 0519   ACIDBASEDEF 2.0 01/14/2020 1722   O2SAT 98.0 01/15/2020 0519     Coagulation Profile: Recent Labs  Lab 01/07/2020 1518  01/17/2020 0340 01/09/2020 1041  INR 1.7* 1.9* 1.9*    Cardiac Enzymes: No results for input(s): CKTOTAL, CKMB, CKMBINDEX, TROPONINI in the last 168 hours.  HbA1C: Hemoglobin A1C  Date/Time Value Ref Range Status  12/01/2019 04:04 PM 10.8 (A) 4.0 - 5.6 % Final  02/03/2019 04:24 PM 12.5 (A) 4.0 - 5.6 % Final   Hgb A1c MFr Bld  Date/Time Value Ref Range Status  01/03/2020 03:41 PM 10.6 (H) 4.8 - 5.6 % Final    Comment:    (NOTE) Pre diabetes:          5.7%-6.4%  Diabetes:              >6.4%  Glycemic control for   <7.0% adults with diabetes   10/01/2018 04:02 PM 11.9 (H) 4.8 - 5.6 % Final    Comment:    (NOTE) Pre diabetes:          5.7%-6.4% Diabetes:              >6.4% Glycemic control for   <7.0% adults with diabetes     CBG: Recent Labs  Lab 01/14/20 2333 01/15/20 0019 01/15/20 0059 01/15/20 0216 01/15/20 0313  GLUCAP 148* 154* 137* 128* 133*    Assessment & Plan:  Acute hypoxic respiratory failure Cardiogenic shock Acute on chronic systolic heart failure requiring mechanical cardiac support with Impala and titration of inotropes Severe coronary artery disease status post CABG, complicated by cardiac tamponade, post hematoma removal in OR x 2 Bipolar affective Diabetes type 2 Paroxysmal atrial fibrillation requiring amiodarone infusion.  Acute kidney injury-improving cardiorenal syndrome  Hyponatremia Hypomagnesemia Acute blood loss anemia  -Continue current ventilator support, PRVC 6 cc/kg.  Is not in a position hemodynamically to tolerate SBT at this time -Continue daily wake-up assessment, now back on sedation -Weaning pressors, inotropes successfully -Impella settings as per cardiology, TCTS -Continue to slowly wean inhaled nitric -Empiric antibiotics as ordered -Following BMP, electrolytes.  With slight rise in serum creatinine may need to consider slight decrease Lasix infusion -Follow CBC, goal hemoglobin 8.0   Daily Goals Checklist   Pain/Anxiety/Delirium protocol (if indicated): versed and fentanyl infusions.  VAP protocol (if indicated): N/A DVT prophylaxis: SCD GI prophylaxis: Protonix Glucose control: Basal and bolus insulin  Code Status: Full code Family Communication: Per primary team Disposition: ICU    Total critical care time: 32 minutes  Performed by: Baltazar Apo   Critical care time was exclusive of separately billable procedures and treating other patients.   Critical care was necessary to treat or prevent imminent or life-threatening deterioration.   Critical care was time spent personally by me on the following activities: development of treatment plan with patient and/or surrogate as well as nursing, discussions with consultants, evaluation of patient's response to treatment, examination of patient, obtaining history from patient or surrogate,  ordering and performing treatments and interventions, ordering and review of laboratory studies, ordering and review of radiographic studies, pulse oximetry and re-evaluation of patient's condition.   Baltazar Apo, MD, PhD 01/15/2020, 9:23 AM [facility] [phone] or if no answer [phone]

## 2020-01-15 NOTE — Progress Notes (Signed)
Pharmacy Antibiotic Note  Corey Palmer is a 49 y.o. male admitted on 01/22/2020  s/p CABG with Impella placement, remains with open chest with concerns for possible sepsis. Pharmacy has been consulted for vancomycin and meropenem dosing.  WBC still rising 20.3, spiking fevers with Tm 100.6. Scr stable 1.74, LA 1.1, PCT 8.65. Vancomycin trough level supratherapeutic at 26.0. Level drawn correctly. Will hold dose for 6 hours and resume at lower dose.  Plan: Hold vancomycin for 6 hours, then adjust to 750 mg IV Q12 hrs Continue meropenem 1 gm IV q8 hrs Monitor renal function, cultures, clinical progression Check vancomycin levels as indicated.  Height: 6\' 3"  (190.5 cm) Weight: 129.2 kg (284 lb 13.4 oz) IBW/kg (Calculated) : 84.5  Temp (24hrs), Avg:99.9 F (37.7 C), Min:99.68 F (37.6 C), Max:100.4 F (38 C)  Recent Labs  Lab 01/22/2020 0950 01/19/2020 1022 01/05/2020 1655 01/24/2020 2321 01/14/20 0406 01/14/20 1700 01/15/20 0100 01/15/20 0513 01/15/20 0622  WBC  --    < > 21.5* 17.4* 17.0* 18.4* 19.0*  --  20.3*  CREATININE  --    < > 1.79*  --  1.61* 1.70* 1.65*  --  1.74*  LATICACIDVEN 1.7  --  1.6  --  1.2 1.3  --  1.1  --    < > = values in this interval not displayed.    Estimated Creatinine Clearance: 72.7 mL/min (A) (by C-G formula based on SCr of 1.74 mg/dL (H)).    Allergies  Allergen Reactions  . Orange Fruit Anaphylaxis, Hives and Other (See Comments)    Per Pt- Blisters around lips and Hives all over, also  . Penicillins Anaphylaxis    Did it involve swelling of the face/tongue/throat, SOB, or low BP? Yes Did it involve sudden or severe rash/hives, skin peeling, or any reaction on the inside of your mouth or nose? Yes Did you need to seek medical attention at a hospital or doctor's office? No When did it last happen?childhood If all above answers are "NO", may proceed with cephalosporin use.  [name] [Empagliflozin] Itching  . [name] [Insulin  Glargine] Nausea And Vomiting    Antimicrobials this admission: Vancomycin 12/15 >>  Meropenem 12/16 >>   Dose adjustments this admission: 12/18: VT 26 - decrease to 750 mg IV q12 hrs  Microbiology results: 12/16 BCx: ngtd 12/14 BCx: ngtd  12/11 MRSA PCR: neg  Richardine Service, PharmD, BCPS PGY2 Cardiology Pharmacy Resident Phone: [phone] 01/15/2020  11:11 AM  Please check [email or web address] for unit-specific pharmacy phone numbers.

## 2020-01-15 NOTE — Progress Notes (Signed)
Patient ID: SALIM FORERO, male   DOB: 1971-01-09, 49 y.o.   MRN: [id number] [facility] DAILY ICU PROGRESS NOTE                   [address]            [place] [address]          [phone]   2 Days Post-Op Procedure(s) (LRB): RE-EXPLORATION POST OPERATIVE OPEN HEART (N/A) TRANSESOPHAGEAL ECHOCARDIOGRAM (TEE) (N/A)  Total Length of Stay:  LOS: 16 days   Subjective: Remains sedated on ventilator, with with chest open Impella in place-  Sedation was decreased this morning and the patient was responsive moving all extremities Now back sedated  Objective: Vital signs in last 24 hours: Temp:  [99.68 F (37.6 C)-100.4 F (38 C)] 100.04 F (37.8 C) (12/18 0700) Pulse Rate:  [45-120] 110 (12/18 0700) Cardiac Rhythm: Atrial fibrillation (12/18 0730) Resp:  [24-34] 24 (12/18 0700) BP: (96-123)/(74-77) 123/74 (12/17 1927) SpO2:  [91 %-100 %] 99 % (12/18 0700) Arterial Line BP: (79-108)/(57-82) 92/65 (12/18 0700) FiO2 (%):  [40 %] 40 % (12/18 0400) Weight:  [129.2 kg] 129.2 kg (12/18 0500)  Filed Weights   01/24/2020 0400 01/14/20 0545 01/15/20 0500  Weight: 122.2 kg 125 kg 129.2 kg    Weight change: 4.2 kg   Hemodynamic parameters for last 24 hours: PAP: (31-44)/(14-28) 35/24 CVP:  [11 mmHg-21 mmHg] 15 mmHg CO:  [4.8 L/min-5.6 L/min] 5.3 L/min CI:  [2.1 L/min/m2-2.4 L/min/m2] 2.3 L/min/m2  Intake/Output from previous day: 12/17 0701 - 12/18 0700 In: 5459.9 [I.V.:3326.5; Blood:602; NG/GT:410; IV Piggyback:954.9] Out: 4105 [Urine:3485; Chest Tube:620]  Intake/Output this shift: No intake/output data recorded.  Current Meds: Scheduled Meds: . acetaminophen  1,000 mg Oral Q6H   Or  . acetaminophen (TYLENOL) oral liquid 160 mg/5 mL  1,000 mg Per Tube Q6H  . aspirin  300 mg Rectal Daily  . atorvastatin  80 mg Per Tube Daily  . bisacodyl  10 mg Oral Daily   Or  . bisacodyl  10 mg Rectal Daily  . busPIRone  10 mg Per Tube TID  . chlorhexidine gluconate  (MEDLINE KIT)  15 mL Mouth Rinse BID  . Chlorhexidine Gluconate Cloth  6 each Topical Daily  . docusate  100 mg Per Tube BID  . feeding supplement (PIVOT 1.5 CAL)  1,000 mL Per Tube Q24H  . fentaNYL (SUBLIMAZE) injection  25 mcg Intravenous Once  . FLUoxetine  40 mg Per Tube BID  . mouth rinse  15 mL Mouth Rinse 10 times per day  . pantoprazole sodium  40 mg Per Tube Daily  . polyethylene glycol  17 g Per Tube Daily  . sodium chloride flush  10-40 mL Intracatheter Q12H  . sodium chloride flush  3 mL Intravenous Q12H  . valproic acid  250 mg Per Tube BID   Continuous Infusions: . sodium chloride Stopped (01/03/2020 1646)  . sodium chloride 10 mL/hr at 01/14/20 2101  . sodium chloride 10 mL/hr at 01/15/20 0700  . amiodarone 30 mg/hr (01/15/20 0700)  . epinephrine 4 mcg/min (01/15/20 0700)  . fentaNYL infusion INTRAVENOUS 100 mcg/hr (01/15/20 0700)  . furosemide (LASIX) 200 mg in dextrose 5% 100 mL (34m/mL) infusion 12 mg/hr (01/15/20 0700)  . impella catheter heparin 50 unit/mL in dextrose 5%    . insulin 2 mL/hr at 01/15/20 0700  . lactated ringers 20 mL/hr at 01/15/20 0700  . meropenem (MERREM) IV Stopped (01/15/20 0657)  .  midazolam 1 mg/hr (01/15/20 0700)  . milrinone 0.125 mcg/kg/min (01/15/20 0700)  . norepinephrine (LEVOPHED) Adult infusion 18 mcg/min (01/15/20 0700)  . vancomycin Stopped (01/14/20 2314)  . vasopressin 0.04 Units/min (01/15/20 0700)   PRN Meds:.sodium chloride, sodium chloride, dextrose, fentaNYL, fentaNYL (SUBLIMAZE) injection, fentaNYL (SUBLIMAZE) injection, metoprolol tartrate, midazolam, midazolam, midazolam, ondansetron (ZOFRAN) IV, oxyCODONE, sodium chloride flush, sodium chloride flush  General appearance: Sedated Neurologic: intact and On brief exam as sedation was decreased this morning Heart: regular rate and rhythm Lungs: diminished breath sounds bilaterally Abdomen: soft, non-tender; bowel sounds normal; no masses,  no organomegaly Extremities: No  evidence of digit necrosis or ischemic fingers or toes on exam today Wound: Chest is open with Esmarch in place  Lab Results: CBC: Recent Labs    01/15/20 0100 01/15/20 0519 01/15/20 0622  WBC 19.0*  --  20.3*  HGB 9.3* 9.2* 9.6*  HCT 27.4* 27.0* 27.2*  PLT 165  --  173   BMET:  Recent Labs    01/15/20 0100 01/15/20 0519 01/15/20 0622  NA 136 138 137  K 3.5 4.0 3.9  CL 103  --  103  CO2 22  --  24  GLUCOSE 142*  --  131*  BUN 30*  --  30*  CREATININE 1.65*  --  1.74*  CALCIUM 8.2*  --  8.2*    CMET: Lab Results  Component Value Date   WBC 20.3 (H) 01/15/2020   HGB 9.6 (L) 01/15/2020   HCT 27.2 (L) 01/15/2020   PLT 173 01/15/2020   GLUCOSE 131 (H) 01/15/2020   CHOL 176 10/01/2018   TRIG 126 10/01/2018   HDL 25 (L) 10/01/2018   LDLDIRECT 116.5 11/01/2011   LDLCALC 126 (H) 10/01/2018   ALT 171 (H) 01/14/2020   AST 264 (H) 01/14/2020   NA 137 01/15/2020   K 3.9 01/15/2020   CL 103 01/15/2020   CREATININE 1.74 (H) 01/15/2020   BUN 30 (H) 01/15/2020   CO2 24 01/15/2020   TSH 3.009 10/01/2018   INR 1.9 (H) 01/02/2020   HGBA1C 10.6 (H) 01/03/2020      PT/INR:  Recent Labs    01/24/2020 1041  LABPROT 21.5*  INR 1.9*   Radiology: DG Abd Portable 1V  Result Date: 01/14/2020 CLINICAL DATA:  Feeding tube placement EXAM: PORTABLE ABDOMEN - 1 VIEW COMPARISON:  04/03/2011 FINDINGS: Feeding tube passes through the stomach with the tip near the duodenal bulb. Normal bowel gas pattern. Chest findings reported separately. IMPRESSION: Feeding tube tip in the region of the duodenal bulb. No dilated bowel loops. Electronically Signed   By: Franchot Gallo M.D.   On: 01/14/2020 11:29   ECHOCARDIOGRAM LIMITED  Result Date: 01/14/2020    ECHOCARDIOGRAM LIMITED REPORT   Patient Name:   ESTUS KRAKOWSKI Date of Exam: 01/14/2020 Medical Rec #:  [id number]     Height:       75.0 in Accession #:    [id number]    Weight:       275.6 lb Date of Birth:  August 11, 1970     BSA:           2.515 m Patient Age:    49 years      BP:           107/27 mmHg Patient Gender: M             HR:           115 bpm. Exam Location:  Inpatient Procedure: Limited Echo  MODIFIED REPORT: This report was modified by Buford Dresser MD on 01/14/2020 due to signed                                   prematurely.  Indications:     Impella position  History:         Patient has prior history of Echocardiogram examinations, most                  recent 01/24/2020. CHF and Cardiomyopathy, CAD, Prior CABG,                  Arrythmias:Atrial Fibrillation; Risk Factors:Hypertension,                  Diabetes, Dyslipidemia and Non-Smoker.  Sonographer:     Vickie Epley RDCS Referring Phys:  [facility] Diagnosing Phys: Buford Dresser MD  Conclusion(s)/Recommendation(s): Limited study for impella placement. Impella noted as 4.6 cm from aortic cusps. EF severely reduced at <20%. Catheter noted in RA and RV, likely Swan.    Final (Updated)      Assessment/Plan: S/P Procedure(s) (LRB): RE-EXPLORATION POST OPERATIVE OPEN HEART (N/A) TRANSESOPHAGEAL ECHOCARDIOGRAM (TEE) (N/A) Some slight deterioration in renal function with creatinine elevated to 1.74, 1.65 yesterday, currently on Lasix drip-continues may have to adjust if becomes intravascularly depleted Impella on P6 no alarms during the night Currently on Levophed 17 vasopressin 0.4 epinephrine for milrinone 0.125 Lasix drip 12 mg an hour COOX is currently 54 Patient not ready to consider closing chest at this point   Grace Isaac 01/15/2020 7:59 AM

## 2020-01-16 ENCOUNTER — Inpatient Hospital Stay (HOSPITAL_COMMUNITY): Payer: BC Managed Care – PPO

## 2020-01-16 LAB — POCT I-STAT 7, (LYTES, BLD GAS, ICA,H+H)
Acid-Base Excess: 0 mmol/L (ref 0.0–2.0)
Acid-Base Excess: 0 mmol/L (ref 0.0–2.0)
Bicarbonate: 25.3 mmol/L (ref 20.0–28.0)
Bicarbonate: 24.9 mmol/L (ref 20.0–28.0)
Calcium, Ion: 1.23 mmol/L (ref 1.15–1.40)
Calcium, Ion: 1.2 mmol/L (ref 1.15–1.40)
HCT: 25 % — ABNORMAL LOW (ref 39.0–52.0)
HCT: 25 % — ABNORMAL LOW (ref 39.0–52.0)
Hemoglobin: 8.5 g/dL — ABNORMAL LOW (ref 13.0–17.0)
Hemoglobin: 8.5 g/dL — ABNORMAL LOW (ref 13.0–17.0)
O2 Saturation: 97 %
O2 Saturation: 98 %
Patient temperature: 38.4
Patient temperature: 37.5
Potassium: 3.6 mmol/L (ref 3.5–5.1)
Potassium: 3.5 mmol/L (ref 3.5–5.1)
Sodium: 139 mmol/L (ref 135–145)
Sodium: 140 mmol/L (ref 135–145)
TCO2: 27 mmol/L (ref 22–32)
TCO2: 26 mmol/L (ref 22–32)
pCO2 arterial: 47.4 mmHg (ref 32.0–48.0)
pCO2 arterial: 43.9 mmHg (ref 32.0–48.0)
pH, Arterial: 7.341 — ABNORMAL LOW (ref 7.350–7.450)
pH, Arterial: 7.363 (ref 7.350–7.450)
pO2, Arterial: 105 mmHg (ref 83.0–108.0)
pO2, Arterial: 104 mmHg (ref 83.0–108.0)

## 2020-01-16 LAB — LACTIC ACID, PLASMA
Lactic Acid, Venous: 1 mmol/L (ref 0.5–1.9)
Lactic Acid, Venous: 0.9 mmol/L (ref 0.5–1.9)

## 2020-01-16 LAB — CBC
HCT: 26.2 % — ABNORMAL LOW (ref 39.0–52.0)
HCT: 26.9 % — ABNORMAL LOW (ref 39.0–52.0)
Hemoglobin: 8.6 g/dL — ABNORMAL LOW (ref 13.0–17.0)
Hemoglobin: 8.8 g/dL — ABNORMAL LOW (ref 13.0–17.0)
MCH: 28.9 pg (ref 26.0–34.0)
MCH: 28.7 pg (ref 26.0–34.0)
MCHC: 32.8 g/dL (ref 30.0–36.0)
MCHC: 32.7 g/dL (ref 30.0–36.0)
MCV: 87.9 fL (ref 80.0–100.0)
MCV: 87.6 fL (ref 80.0–100.0)
Platelets: 187 10*3/uL (ref 150–400)
Platelets: 201 10*3/uL (ref 150–400)
RBC: 2.98 MIL/uL — ABNORMAL LOW (ref 4.22–5.81)
RBC: 3.07 MIL/uL — ABNORMAL LOW (ref 4.22–5.81)
RDW: 17.4 % — ABNORMAL HIGH (ref 11.5–15.5)
RDW: 17.5 % — ABNORMAL HIGH (ref 11.5–15.5)
WBC: 18.8 10*3/uL — ABNORMAL HIGH (ref 4.0–10.5)
WBC: 18.8 10*3/uL — ABNORMAL HIGH (ref 4.0–10.5)
nRBC: 0.8 % — ABNORMAL HIGH (ref 0.0–0.2)
nRBC: 0.7 % — ABNORMAL HIGH (ref 0.0–0.2)

## 2020-01-16 LAB — RENAL FUNCTION PANEL
Albumin: 2 g/dL — ABNORMAL LOW (ref 3.5–5.0)
Anion gap: 9 (ref 5–15)
BUN: 36 mg/dL — ABNORMAL HIGH (ref 6–20)
CO2: 24 mmol/L (ref 22–32)
Calcium: 7.9 mg/dL — ABNORMAL LOW (ref 8.9–10.3)
Chloride: 104 mmol/L (ref 98–111)
Creatinine, Ser: 1.78 mg/dL — ABNORMAL HIGH (ref 0.61–1.24)
GFR, Estimated: 46 mL/min — ABNORMAL LOW (ref >60)
Glucose, Bld: 182 mg/dL — ABNORMAL HIGH (ref 70–99)
Phosphorus: 3.3 mg/dL (ref 2.5–4.6)
Potassium: 3.2 mmol/L — ABNORMAL LOW (ref 3.5–5.1)
Sodium: 137 mmol/L (ref 135–145)

## 2020-01-16 LAB — COMPREHENSIVE METABOLIC PANEL
ALT: 335 U/L — ABNORMAL HIGH (ref 0–44)
AST: 350 U/L — ABNORMAL HIGH (ref 15–41)
Albumin: 2 g/dL — ABNORMAL LOW (ref 3.5–5.0)
Alkaline Phosphatase: 155 U/L — ABNORMAL HIGH (ref 38–126)
Anion gap: 10 (ref 5–15)
BUN: 36 mg/dL — ABNORMAL HIGH (ref 6–20)
CO2: 24 mmol/L (ref 22–32)
Calcium: 8 mg/dL — ABNORMAL LOW (ref 8.9–10.3)
Chloride: 104 mmol/L (ref 98–111)
Creatinine, Ser: 2.02 mg/dL — ABNORMAL HIGH (ref 0.61–1.24)
GFR, Estimated: 39 mL/min — ABNORMAL LOW (ref >60)
Glucose, Bld: 161 mg/dL — ABNORMAL HIGH (ref 70–99)
Potassium: 3.6 mmol/L (ref 3.5–5.1)
Sodium: 138 mmol/L (ref 135–145)
Total Bilirubin: 1.6 mg/dL — ABNORMAL HIGH (ref 0.3–1.2)
Total Protein: 5 g/dL — ABNORMAL LOW (ref 6.5–8.1)

## 2020-01-16 LAB — LACTATE DEHYDROGENASE: LDH: 311 U/L — ABNORMAL HIGH (ref 98–192)

## 2020-01-16 LAB — GLUCOSE, CAPILLARY
Glucose-Capillary: 155 mg/dL — ABNORMAL HIGH (ref 70–99)
Glucose-Capillary: 168 mg/dL — ABNORMAL HIGH (ref 70–99)
Glucose-Capillary: 108 mg/dL — ABNORMAL HIGH (ref 70–99)
Glucose-Capillary: 151 mg/dL — ABNORMAL HIGH (ref 70–99)
Glucose-Capillary: 169 mg/dL — ABNORMAL HIGH (ref 70–99)
Glucose-Capillary: 154 mg/dL — ABNORMAL HIGH (ref 70–99)
Glucose-Capillary: 148 mg/dL — ABNORMAL HIGH (ref 70–99)
Glucose-Capillary: 152 mg/dL — ABNORMAL HIGH (ref 70–99)
Glucose-Capillary: 139 mg/dL — ABNORMAL HIGH (ref 70–99)
Glucose-Capillary: 125 mg/dL — ABNORMAL HIGH (ref 70–99)
Glucose-Capillary: 147 mg/dL — ABNORMAL HIGH (ref 70–99)
Glucose-Capillary: 170 mg/dL — ABNORMAL HIGH (ref 70–99)
Glucose-Capillary: 167 mg/dL — ABNORMAL HIGH (ref 70–99)
Glucose-Capillary: 163 mg/dL — ABNORMAL HIGH (ref 70–99)

## 2020-01-16 LAB — CULTURE, BLOOD (ROUTINE X 2)
Culture: NO GROWTH
Culture: NO GROWTH
Special Requests: ADEQUATE

## 2020-01-16 LAB — COOXEMETRY PANEL
Carboxyhemoglobin: 1.3 % (ref 0.5–1.5)
Methemoglobin: 1 % (ref 0.0–1.5)
O2 Saturation: 58.7 %
Total hemoglobin: 9.1 g/dL — ABNORMAL LOW (ref 12.0–16.0)

## 2020-01-16 LAB — HEPARIN LEVEL (UNFRACTIONATED): Heparin Unfractionated: <0.1 IU/mL — ABNORMAL LOW (ref 0.30–0.70)

## 2020-01-16 MED ORDER — HEPARIN SODIUM (PORCINE) 1000 UNIT/ML DIALYSIS
1000.0000 [IU] | INTRAMUSCULAR | Status: DC | PRN
  Administered 2020-01-20: 18:00:00 3800 [IU] via INTRAVENOUS_CENTRAL
  Administered 2020-01-25: 12:00:00 4000 [IU] via INTRAVENOUS_CENTRAL
  Administered 2020-01-27: 20:00:00 3800 [IU] via INTRAVENOUS_CENTRAL
  Administered 2020-01-28 – 2020-02-02 (×2): 2600 [IU] via INTRAVENOUS_CENTRAL
  Administered 2020-02-07: 15:00:00 1300 [IU] via INTRAVENOUS_CENTRAL
  Administered 2020-02-12: 16:00:00 3000 [IU] via INTRAVENOUS_CENTRAL
  Administered 2020-02-14: 2600 [IU] via INTRAVENOUS_CENTRAL
  Administered 2020-02-18 – 2020-03-01 (×3): 2800 [IU] via INTRAVENOUS_CENTRAL
  Administered 2020-03-02: 1400 [IU] via INTRAVENOUS_CENTRAL
  Administered 2020-03-06: 3000 [IU] via INTRAVENOUS_CENTRAL
  Filled 2020-01-16: qty 6
  Filled 2020-01-16: qty 4
  Filled 2020-01-16 (×2): qty 6
  Filled 2020-01-16 (×2): qty 3
  Filled 2020-01-16 (×3): qty 6
  Filled 2020-01-16: qty 5
  Filled 2020-01-16: qty 4
  Filled 2020-01-16: qty 6
  Filled 2020-01-16 (×2): qty 4
  Filled 2020-01-16 (×3): qty 6
  Filled 2020-01-16: qty 3
  Filled 2020-01-16 (×3): qty 6
  Filled 2020-01-16: qty 4
  Filled 2020-01-16: qty 6
  Filled 2020-01-16: qty 3
  Filled 2020-01-16 (×2): qty 6

## 2020-01-16 MED ORDER — POTASSIUM CHLORIDE 10 MEQ/50ML IV SOLN
10.0000 meq | INTRAVENOUS | Status: AC
  Administered 2020-01-16 (×3): 10 meq via INTRAVENOUS
  Filled 2020-01-16 (×3): qty 50

## 2020-01-16 MED ORDER — PRISMASOL BGK 4/2.5 32-4-2.5 MEQ/L EC SOLN: Status: DC

## 2020-01-16 MED ORDER — HEPARIN (PORCINE) 2000 UNITS/L FOR CRRT
INTRAVENOUS_CENTRAL | Status: DC | PRN
  Administered 2020-01-26: 11:00:00 1000 mL via INTRAVENOUS_CENTRAL
  Administered 2020-01-28 – 2020-03-02 (×5): 2000 mL via INTRAVENOUS_CENTRAL
  Filled 2020-01-16 (×9): qty 1000

## 2020-01-16 MED ORDER — PRISMASOL BGK 4/2.5 32-4-2.5 MEQ/L REPLACEMENT SOLN: Status: DC

## 2020-01-16 MED ORDER — VANCOMYCIN HCL 1500 MG/300ML IV SOLN
1500.0000 mg | INTRAVENOUS | Status: DC
  Administered 2020-01-16 – 2020-01-18 (×3): 1500 mg via INTRAVENOUS
  Filled 2020-01-16 (×3): qty 300

## 2020-01-16 NOTE — CV Procedure (Signed)
Central Venous Catheter Insertion Procedure Note LADISLAUS REPSHER [id number] Jul 07, 1970    Procedure: Insertion of Central Venous Catheter Indications: CVVHD   Procedure Details Consent: Emergent consent Time Out: Verified patient identification, verified procedure, site/side was marked, verified correct patient position, special equipment/implants available, medications/allergies/relevent history reviewed, required imaging and test results available.  Performed   Maximum sterile technique was used including antiseptics, cap, gloves, gown, hand hygiene, mask and sheet. Skin prep: Chlorhexidine; local anesthetic administered The right groin area (inclduing the pre-existing femoral venous sheath was prepped and draped sterilely. The pre-existing sheath was wired with an 0.035 wire and changed out for a 14FR HD cath. Both ports had good blood return. The ctheter was flushed and loaded with heparin. A Bio-patch and dressing were placed.    Evaluation Blood flow good Complications: No apparent complications Patient did tolerate procedure well.    Glori Bickers, MD  11:01 AM

## 2020-01-16 NOTE — Progress Notes (Signed)
Patient ID: Corey Palmer, male   DOB: [birth year], 49 y.o.   MRN: [id number]     Advanced Heart Failure Rounding Note  PCP-Cardiologist: No primary care provider on file.   Subjective:    - 12/7 Torsades -ICD shock x1.  - 12/11 Impella 5.5 placed.  - 12/12 Impella repositioned in OR - 12/15 CABG x 4 with LIMA-LAD, SVG-PDA/PLV, SVG-OM - 12/16 DCCV afib.  Back to OR to re-open chest and again to evacuate hematoma.   Intubated/sedated. Chest open. NE 18-> 9 On epi 4, milrinone and VP   On lasix gtt at 12 and metolazone 5 bid. Only 300 cc urine output this am Creatinine rising. Now 2.02. LFTs rising slowly as well. O2 requirements increasing, Vent increased to 60% this am. CXR with worsening edema.   Remains on meropenem and vancomycin.   Swan numbers: CVP 18 PA 34/22 Co/CI 6.2/2.7 Co-ox 59% PAPi 0.75  Impella 5.5 P-6 Flow 3.6 Heparin only in purge  Echo (12/10): EF <20%, moderate LV dilation, normal RV size with mildly decreased systolic function.    Objective:   Weight Range: 126.9 kg Body mass index is 34.97 kg/m.   Vital Signs:   Temp:  [98.78 F (37.1 C)-101.5 F (38.6 C)] 98.78 F (37.1 C) (12/19 0800) Pulse Rate:  [80-126] 90 (12/19 0800) Resp:  [19-25] 24 (12/19 0800) BP: (94)/(62) 94/62 (12/18 1508) SpO2:  [93 %-99 %] 97 % (12/19 0800) Arterial Line BP: (77-100)/(60-71) 91/65 (12/19 0800) FiO2 (%):  [40 %] 40 % (12/19 0800) Weight:  [126.9 kg] 126.9 kg (12/19 0600) Last BM Date: 01/16/20  Weight change: Filed Weights   01/14/20 0545 01/15/20 0500 01/16/20 0600  Weight: 125 kg 129.2 kg 126.9 kg    Intake/Output:   Intake/Output Summary (Last 24 hours) at 01/16/2020 0900 Last data filed at 01/16/2020 0800 Gross per 24 hour  Intake 4059.94 ml  Output 3428 ml  Net 631.94 ml      Physical Exam   General:  Sedated on vent. Chest open HEENT: normal + ETT/NG Neck: supple. RIJ swanCarotids 2+ bilat; no bruits. No lymphadenopathy or thryomegaly  appreciated. Cor: Chest open Irregular. + CTs Lungs: coarse Abdomen: soft, nontender, nondistended.  Extremities: no cyanosis, clubbing, rash, 3-4+ edema Neuro: intubated/sedated   Telemetry    Atrial fibrillation 90-110 Personally reviewed   Labs    CBC Recent Labs    01/15/20 1733 01/15/20 1741 01/16/20 0349 01/16/20 0427  WBC 17.7*  --  18.8*  --   HGB 8.6*   < > 8.6* 8.5*  HCT 26.4*   < > 26.2* 25.0*  MCV 86.6  --  87.9  --   PLT 171  --  187  --    < > = values in this interval not displayed.   Basic Metabolic Panel Recent Labs    01/15/20 0100 01/15/20 0519 01/15/20 1733 01/15/20 1741 01/16/20 0349 01/16/20 0427  NA 136   < > 137   < > 138 140  K 3.5   < > 3.6   < > 3.6 3.5  CL 103   < > 104  --  104  --   CO2 22   < > 24  --  24  --   GLUCOSE 142*   < > 153*  --  161*  --   BUN 30*   < > 32*  --  36*  --   CREATININE 1.65*   < > 1.85*  --  2.02*  --   CALCIUM 8.2*   < > 8.1*  --  8.0*  --   MG 1.8  --  1.6*  --   --   --   PHOS 4.1  --  3.7  --   --   --    < > = values in this interval not displayed.   Liver Function Tests Recent Labs    01/14/20 1700 01/16/20 0349  AST 264* 350*  ALT 171* 335*  ALKPHOS 88 155*  BILITOT 1.6* 1.6*  PROT 4.7* 5.0*  ALBUMIN 2.3* 2.0*   No results for input(s): LIPASE, AMYLASE in the last 72 hours. Cardiac Enzymes No results for input(s): CKTOTAL, CKMB, CKMBINDEX, TROPONINI in the last 72 hours.  BNP: BNP (last 3 results) Recent Labs    01/10/2020 1619  BNP 577.5*    ProBNP (last 3 results) No results for input(s): PROBNP in the last 8760 hours.   D-Dimer No results for input(s): DDIMER in the last 72 hours. Hemoglobin A1C No results for input(s): HGBA1C in the last 72 hours. Fasting Lipid Panel No results for input(s): CHOL, HDL, LDLCALC, TRIG, CHOLHDL, LDLDIRECT in the last 72 hours. Thyroid Function Tests No results for input(s): TSH, T4TOTAL, T3FREE, THYROIDAB in the last 72 hours.  Invalid  input(s): FREET3  Other results:   Imaging    No results found.   Medications:     Scheduled Medications: . acetaminophen  1,000 mg Oral Q6H   Or  . acetaminophen (TYLENOL) oral liquid 160 mg/5 mL  1,000 mg Per Tube Q6H  . aspirin  300 mg Rectal Daily  . atorvastatin  80 mg Per Tube Daily  . bisacodyl  10 mg Oral Daily   Or  . bisacodyl  10 mg Rectal Daily  . busPIRone  10 mg Per Tube TID  . chlorhexidine gluconate (MEDLINE KIT)  15 mL Mouth Rinse BID  . Chlorhexidine Gluconate Cloth  6 each Topical Daily  . docusate  100 mg Per Tube BID  . feeding supplement (PIVOT 1.5 CAL)  1,000 mL Per Tube Q24H  . fentaNYL (SUBLIMAZE) injection  25 mcg Intravenous Once  . FLUoxetine  40 mg Per Tube BID  . mouth rinse  15 mL Mouth Rinse 10 times per day  . metolazone  5 mg Per Tube BID  . pantoprazole sodium  40 mg Per Tube Daily  . polyethylene glycol  17 g Per Tube Daily  . potassium chloride  40 mEq Per Tube BID  . sodium chloride flush  10-40 mL Intracatheter Q12H  . sodium chloride flush  3 mL Intravenous Q12H  . valproic acid  250 mg Per Tube BID    Infusions: . sodium chloride Stopped (12/31/2019 1646)  . sodium chloride 10 mL/hr at 01/14/20 2101  . sodium chloride 10 mL/hr at 01/16/20 0700  . amiodarone 30 mg/hr (01/16/20 0700)  . epinephrine 4 mcg/min (01/16/20 0700)  . fentaNYL infusion INTRAVENOUS 100 mcg/hr (01/16/20 0700)  . furosemide (LASIX) 200 mg in dextrose 5% 100 mL (68m/mL) infusion 12 mg/hr (01/16/20 0700)  . impella catheter heparin 50 unit/mL in dextrose 5%    . insulin 3.6 mL/hr at 01/16/20 0700  . lactated ringers 20 mL/hr at 01/16/20 0700  . meropenem (MERREM) IV Stopped (01/16/20 0546)  . midazolam Stopped (01/15/20 1301)  . milrinone 0.125 mcg/kg/min (01/16/20 0700)  . norepinephrine (LEVOPHED) Adult infusion 9 mcg/min (01/16/20 0714)  . vancomycin 150 mL/hr at 01/16/20 0700  . vasopressin  0.04 Units/min (01/16/20 0700)    PRN  Medications: sodium chloride, sodium chloride, dextrose, fentaNYL, fentaNYL (SUBLIMAZE) injection, fentaNYL (SUBLIMAZE) injection, metoprolol tartrate, midazolam, midazolam, midazolam, ondansetron (ZOFRAN) IV, oxyCODONE, sodium chloride flush, sodium chloride flush     Assessment/Plan   1. Cardiogenic shock/acute on chronic systolic CHF: Initially nonischemic cardiomyopathy. Possible familial cardiomyopathy as both parents had cardiomyopathy and died at around 23.However, [employer] gene testing did not show any common mutation for cardiomyopathy.  However, this admission noted to have severe 3 vessel disease so suspect component of ischemic cardiomyopathy. [employer] ICD. Echo in 8/20 with EF 15% and mildly decreased RV function. Echo this admission with EF < 20%, moderate LV dilation, RV mildly reduced, severe LAE, no significant MR.Low output HF with markedly low EF.  He has a long history of cardiomyopathy (20 yrs), tends to minimize symptoms.  Cardiorenal syndrome with creatinine up to 2.2,  stabilized on milrinone and Impella 5.5. SCr 1.44 day of CABG. CABG 12/15.  Post-op shock, back to OR 12/16 to open chest (chest wall compartment syndrome) and again to evacuate hematoma.  TEE post-op with severe RV dysfunction.  Remains markedly volume overloaded despite lasix gtt and metolazone. Poor urine output despite decent cardiac output. Suspect ATN Creatinine getting worse. CXR and oxygenation worse. - Need to keep renal perfusion high. I turned Impella back up to P-8 as tolerated - Increase NE as needed to keep MAPS > 70 consistently and closer to 80-90 - I spoke with Dr. [name]. Will proceed with CVVHD for volume removal.  - Impella waveforms and position ok. LDH 412 -> 468 -> 311.   2. Atrial fibrillation: H/o PAF.  He was on dofetilide in the remote past but this was stopped due to noncompliance. He had an upper GI bleed from antral ulcers in 3/12. He was seen by GI and was deemed safe to restart  anticoagulation as long as he remains on a PPI.  No apparent recurrence of AF until just prior to this admission, was cardioverted back to NSR in ER but went back into atrial fibrillation.  Post-op afib, DCCV to NSR/BiV pacing am 12/16, now back in atrial fibrillation Remains in AF, - Continue amiodarone at 30 mg/hr. - Would not try to cardiovert again until back on heparin gtt and vasoactive meds weaned.  - Off heparin post-op with bleeding, only getting heparin in Impella purge currently.  Restart systemic heparin gtt when surgery thinks safe to do so. No change - Unable to do Maze with CABG.   3. CAD:   Cath this admission with severe 3 vessel disease, ideally treated by CABG given extent of disease. CABG x 4 on 12/15.  - Continue atorvastatin, ASA.  - continue current management 4. Acute on chronic hypoxemic respiratory failure: Pulmonary edema, vent per CCM.  - Oxygenation worse in setting of worsening volume overload. Start CVVHD. - CCM following 5. AKI on CKD stage ?3: Creatinine continues to climb. Likely ATN/cardiorenal - 50 pounds up. Needs fluid off to improve oxygenation and to permit chest closure.  - I d/w Renal. Appreciate their help.  6. Diabetes: Insulin.  7. Hyponatremia: Resolved.  8. Torsades/ICD shock: 12/7/212 in hospital event, in setting of severe hypokalemia and hypomagnesemia.   9. Gout: h/o gout. Complains of rt knee pain c/w previous flares - treated w/ prednisone burst (completed).  10: ID: Tm 101.5 PCT 13.46 -> 8.65  Cultures NGTD.  - Covering with vancomycin/meropenem (PCN allergy).   CRITICAL CARE Performed by: Quillian Quince  Stanly Si  Total critical care time: 40 minutes  Critical care time was exclusive of separately billable procedures and treating other patients.  Critical care was necessary to treat or prevent imminent or life-threatening deterioration.  Critical care was time spent personally by me on the following activities: development of treatment  plan with patient and/or surrogate as well as nursing, discussions with consultants, evaluation of patient's response to treatment, examination of patient, obtaining history from patient or surrogate, ordering and performing treatments and interventions, ordering and review of laboratory studies, ordering and review of radiographic studies, pulse oximetry and re-evaluation of patient's condition.   Glori Bickers MD 01/16/2020 9:00 AM

## 2020-01-16 NOTE — Progress Notes (Signed)
NAME:  Corey Palmer, MRN:  [id number], DOB:  1970/04/22, LOS: 51 ADMISSION DATE:  01/10/2020, CONSULTATION DATE: 01/22/2020 REFERRING MD: Aundra Dubin, CHIEF COMPLAINT: Respiratory failure post Impella  HPI/course in hospital  49 year old man underwent Impella 5.5 insertion 12/11 for persistent symptoms of forward failure with low cardiac indices.  He presented last week for ICD shocks and was found to be in rapid atrial fibrillation for which he was cardioverted.  He was also found to be in decompensated heart failure with an ejection fraction of 15% and multivessel coronary disease on left heart catheterization.  He was started on tropes and diuresed which improved his congestive symptoms but his index remained low so an Impella was placed to optimize his hemodynamics prior to a high risk redo-bypass.  Uneventful Impella implantation.  Patient returned from operating room intubated.  He had to go back to the OR for displacement of Impella 12/12.  Underwent CABGx4 on 12/15.  Returned to ICU on significant inotropic support and intubated.  Past Medical History   Past Medical History:  Diagnosis Date  . Anginal pain ([place])   . Anxiety   . Arthritis   . Automatic implantable cardioverter-defibrillator in situ 2007   [employer]   . Bipolar disorder ([place])   . CAD (coronary artery disease)    Cardiac cath (08/19/2000) - Nonobstructive, 40% stenosis of LAD,.   . Cancer [facility]) 2013   benign stomach cancer  . CHF (congestive heart failure) ([place])   . Chondrocalcinosis of right knee 06/11/2013  . Chronic systolic heart failure ([place])    2D echo (08/8108) - Systolic function severely reduced., LV EF 20-25%. Akinesis of apical and anteroseptal mycoardium.  last 2D echo (11/2008 ), LV EF 25-30%, diffuse hypokinesis, and grade 1 diastolic dysfunction.  . Depression   . Diabetes uncomplicated adult-type II    on insulin therapy, a1c 12.9 in 01/2011  . Dilated cardiomyopathy ([place])    status post ICD  placement in 2008 c/b tearing at the atrial junction resulting in tamponade and urgent thoracotomy  . Dyslipidemia   . Dysrhythmia   . GERD (gastroesophageal reflux disease)   . Gout, unspecified   . Hepatic steatosis 2011   seen on ultrasound.   . History of kidney stones   . Hx of echocardiogram    Echo (04/2013):  Mild LVH, EF 20-25%, mild LAE, mild to mod RVE with mild reduced RVSF.  Marland Kitchen Hypertension   . Obesity (BMI 30.0-34.9)   . Pacemaker   . Paroxysmal atrial fibrillation (H[facility])    on chronic coumadin, goal INR 2-3. status post direct current  . Peptic ulcer   . Shortness of breath   . Sleep apnea    does not have CPAP     Past Surgical History:  Procedure Laterality Date  . BEDSIDE EVACUATION OF HEMATOMA N/A 01/04/2020   Procedure: CHEST EXPLORATION @2H12 ;  Surgeon: Ivin Poot, MD;  Location: [place];  Service: Cardiovascular;  Laterality: N/A;  . BEDSIDE EVACUATION OF HEMATOMA N/A 01/12/2020   Procedure: CHEST EXPLORATION @2H12 ;  Surgeon: [name], MD;  Location: [place];  Service: Open Heart Surgery;  Laterality: N/A;  . BIOPSY  12/01/2019   Procedure: BIOPSY;  Surgeon: Arta Silence, MD;  Location: [facility];  Service: Endoscopy;;  . CARDIAC CATHETERIZATION    . CARDIAC PACEMAKER PLACEMENT    . CHOLECYSTECTOMY    . COLONOSCOPY    . CORONARY ARTERY BYPASS GRAFT    . CORONARY ARTERY BYPASS GRAFT N/A  01/02/2020   Procedure: CORONARY ARTERY BYPASS GRAFTING (CABG), ON PUMP, TIMES FOUR, USING LEFT INTERNAL MAMMARY ARTERY AND ENDOSCOPICALLY HARVESTED RIGHT GREATER SAPHENOUS VEIN;  Surgeon: [name], MD;  Location: [place];  Service: Open Heart Surgery;  Laterality: N/A;  . ESOPHAGOGASTRODUODENOSCOPY  04/05/2011   Procedure: ESOPHAGOGASTRODUODENOSCOPY (EGD);  Surgeon: Irene Shipper, MD;  Location: [facility] ENDOSCOPY;  Service: Endoscopy;  Laterality: N/A;  . ESOPHAGOGASTRODUODENOSCOPY (EGD) WITH PROPOFOL N/A 12/01/2019   Procedure: ESOPHAGOGASTRODUODENOSCOPY (EGD)  WITH PROPOFOL;  Surgeon: Arta Silence, MD;  Location: [facility];  Service: Endoscopy;  Laterality: N/A;  . EXPLORATION POST OPERATIVE OPEN HEART N/A 01/12/2020   Procedure: RE-EXPLORATION POST OPERATIVE OPEN HEART;  Surgeon: [name], MD;  Location: [place];  Service: Open Heart Surgery;  Laterality: N/A;  . JOINT REPLACEMENT     left knee replacement  . KNEE ARTHROSCOPY Right 06/11/2013   Procedure: RIGHT KNEE ARTHROSCOPY with chondroplasty;  Surgeon: Johnny Bridge, MD;  Location: [place];  Service: Orthopedics;  Laterality: Right;  . PLACEMENT OF IMPELLA LEFT VENTRICULAR ASSIST DEVICE N/A 01/15/2020   Procedure: PLACEMENT OF IMPELLA LEFT VENTRICULAR ASSIST DEVICE;  Surgeon: [name], MD;  Location: [place];  Service: Open Heart Surgery;  Laterality: N/A;  . PLACEMENT OF IMPELLA LEFT VENTRICULAR ASSIST DEVICE Right 01/24/2020   Procedure: AXILLARY  ARTERY PLACEMENT OF ABIOMED IMPELLA  5.5 LEFT VENTRICULAR ASSIST DEVICE;  Surgeon: [name], MD;  Location: [place];  Service: Open Heart Surgery;  Laterality: Right;  . RIGHT/LEFT HEART CATH AND CORONARY ANGIOGRAPHY N/A 01/01/2020   Procedure: RIGHT/LEFT HEART CATH AND CORONARY ANGIOGRAPHY;  Surgeon: Larey Dresser, MD;  Location: [place] CV [facility];  Service: Cardiovascular;  Laterality: N/A;  . TEE WITHOUT CARDIOVERSION N/A 01/19/2020   Procedure: TRANSESOPHAGEAL ECHOCARDIOGRAM (TEE);  Surgeon: [name], MD;  Location: [place];  Service: Open Heart Surgery;  Laterality: N/A;  . TEE WITHOUT CARDIOVERSION N/A 01/11/2020   Procedure: TRANSESOPHAGEAL ECHOCARDIOGRAM (TEE);  Surgeon: [name], MD;  Location: [place];  Service: Open Heart Surgery;  Laterality: N/A;  . TOTAL KNEE ARTHROPLASTY Left       Interim history/subjective:   Metolazone added 12/18 Epi 4, norepinephrine 9, milrinone 0.125, vasopressin 0.04 Lasix 12 mg/h, S Cr 1.74 > 1.85 > 2.02 Fentanyl 100 Chest remains open PRVC 510x24, 0.40+5 I/O-  9.9 L total    Objective   Blood pressure 94/62, pulse 97, temperature 98.78 F (37.1 C), resp. rate (!) 24, height 6\' 3"  (1.905 m), weight 126.9 kg, SpO2 98 %. PAP: (34-43)/(21-29) 37/22 CVP:  [15 mmHg-20 mmHg] 16 mmHg PCWP:  [5.2 mmHg] 5.2 mmHg CO:  [2.4 L/min-6.2 L/min] 6.2 L/min CI:  [2.1 L/min/m2-2.7 L/min/m2] 2.7 L/min/m2  Vent Mode: PRVC FiO2 (%):  [40 %] 40 % Set Rate:  [24 bmp] 24 bmp Vt Set:  [510 mL] 510 mL PEEP:  [5 cmH20] 5 cmH20 Plateau Pressure:  [23 cmH20-25 cmH20] 25 cmH20   Intake/Output Summary (Last 24 hours) at 01/16/2020 0749 Last data filed at 01/16/2020 0700 Gross per 24 hour  Intake 4367.93 ml  Output 3743 ml  Net 624.93 ml   Filed Weights   01/14/20 0545 01/15/20 0500 01/16/20 0600  Weight: 125 kg 129.2 kg 126.9 kg   PAP: (34-43)/(21-29) 37/22 CVP:  [15 mmHg-20 mmHg] 16 mmHg PCWP:  [5.2 mmHg] 5.2 mmHg CO:  [2.4 L/min-6.2 L/min] 6.2 L/min CI:  [2.1 L/min/m2-2.7 L/min/m2] 2.7 L/min/m2  Examination: Physical exam: Exam is unchanged  General: Ill-appearing man, intubated, sedated HEENT: ET tube in good position, OG tube in position, no oral lesions Neuro: Sedated, does not wake to voice, does grimace, moves stimulation Chest: Median sternotomy, regular, Impella sounds audible, chest tubes in place, clear Abdomen: Nondistended, hypoactive bowel sounds Skin: Cool extremities, no rash  Ancillary tests (personally reviewed)  CBC: Recent Labs  Lab 01/14/20 1700 01/14/20 1722 01/15/20 0100 01/15/20 0519 01/15/20 0622 01/15/20 1733 01/15/20 1741 01/16/20 0349 01/16/20 0427  WBC 18.4*  --  19.0*  --  20.3* 17.7*  --  18.8*  --   HGB 9.4*   < > 9.3*   < > 9.6* 8.6* 8.5* 8.6* 8.5*  HCT 27.6*   < > 27.4*   < > 27.2* 26.4* 25.0* 26.2* 25.0*  MCV 84.1  --  84.3  --  84.2 86.6  --  87.9  --   PLT 152  --  165  --  173 171  --  187  --    < > = values in this interval not displayed.    Basic Metabolic Panel: Recent Labs  Lab 01/16/2020 1315  01/24/2020 1450 01/09/2020 1655 01/08/2020 1720 01/14/20 1700 01/14/20 1722 01/15/20 0100 01/15/20 0519 01/15/20 0622 01/15/20 1733 01/15/20 1741 01/16/20 0349 01/16/20 0427  NA 133*   < > 135   < > 136   < > 136   < > 137 137 139 138 140  K 4.2   < > 4.2   < > 3.9   < > 3.5   < > 3.9 3.6 3.6 3.6 3.5  CL 100   < > 103   < > 104  --  103  --  103 104  --  104  --   CO2 20*  --  22   < > 23  --  22  --  24 24  --  24  --   GLUCOSE 156*   < > 122*   < > 121*  --  142*  --  131* 153*  --  161*  --   BUN 26*   < > 26*   < > 27*  --  30*  --  30* 32*  --  36*  --   CREATININE 1.82*   < > 1.79*   < > 1.70*  --  1.65*  --  1.74* 1.85*  --  2.02*  --   CALCIUM 8.3*  --  8.1*   < > 8.2*  --  8.2*  --  8.2* 8.1*  --  8.0*  --   MG 2.3  --  2.1  --  1.8  --  1.8  --   --  1.6*  --   --   --   PHOS  --   --   --   --  4.3  --  4.1  --   --  3.7  --   --   --    < > = values in this interval not displayed.   GFR: Estimated Creatinine Clearance: 62.1 mL/min (A) (by C-G formula based on SCr of 2.02 mg/dL (H)). Recent Labs  Lab 01/11/2020 1022 01/15/2020 1655 01/14/20 0406 01/14/20 1700 01/15/20 0100 01/15/20 0513 01/15/20 0622 01/15/20 1733 01/16/20 0349  PROCALCITON 15.15  --  13.46  --  8.65  --   --   --   --   WBC 16.7*   < > 17.0* 18.4* 19.0*  --  20.3*  17.7* 18.8*  LATICACIDVEN  --    < > 1.2 1.3  --  1.1  --  1.1 1.0   < > = values in this interval not displayed.    Liver Function Tests: Recent Labs  Lab 01/25/2020 1022 01/14/20 1700 01/16/20 0349  AST 173* 264* 350*  ALT 85* 171* 335*  ALKPHOS 73 88 155*  BILITOT 1.6* 1.6* 1.6*  PROT 4.3* 4.7* 5.0*  ALBUMIN 2.4* 2.3* 2.0*   No results for input(s): LIPASE, AMYLASE in the last 168 hours. No results for input(s): AMMONIA in the last 168 hours.  ABG    Component Value Date/Time   PHART 7.363 01/16/2020 0427   PCO2ART 43.9 01/16/2020 0427   PO2ART 104 01/16/2020 0427   HCO3 24.9 01/16/2020 0427   TCO2 26 01/16/2020 0427    ACIDBASEDEF 2.0 01/14/2020 1722   O2SAT 98.0 01/16/2020 0427     Coagulation Profile: Recent Labs  Lab 01/18/2020 1518 01/18/2020 0340 01/24/2020 1041  INR 1.7* 1.9* 1.9*    Cardiac Enzymes: No results for input(s): CKTOTAL, CKMB, CKMBINDEX, TROPONINI in the last 168 hours.  HbA1C: Hemoglobin A1C  Date/Time Value Ref Range Status  12/01/2019 04:04 PM 10.8 (A) 4.0 - 5.6 % Final  02/03/2019 04:24 PM 12.5 (A) 4.0 - 5.6 % Final   Hgb A1c MFr Bld  Date/Time Value Ref Range Status  01/03/2020 03:41 PM 10.6 (H) 4.8 - 5.6 % Final    Comment:    (NOTE) Pre diabetes:          5.7%-6.4%  Diabetes:              >6.4%  Glycemic control for   <7.0% adults with diabetes   10/01/2018 04:02 PM 11.9 (H) 4.8 - 5.6 % Final    Comment:    (NOTE) Pre diabetes:          5.7%-6.4% Diabetes:              >6.4% Glycemic control for   <7.0% adults with diabetes     CBG: Recent Labs  Lab 01/16/20 0202 01/16/20 0314 01/16/20 0410 01/16/20 0503 01/16/20 0607  GLUCAP 168* 108* 151* 169* 154*    Assessment & Plan:  Acute hypoxic respiratory failure Cardiogenic shock Acute on chronic systolic heart failure requiring mechanical cardiac support with Impala and titration of inotropes Severe coronary artery disease status post CABG, complicated by cardiac tamponade, post hematoma removal in OR x 2 Bipolar affective Diabetes type 2 Paroxysmal atrial fibrillation requiring amiodarone infusion.  Acute kidney injury-improving cardiorenal syndrome  Hyponatremia Hypomagnesemia Acute blood loss anemia  -Continue with current related support, PRVC 6 cc/kg.  Not in a position or hemodynamically stable enough to consider spontaneous breathing trials -Continue daily wake-up assessments, continue current level of sedation -Volume removal still an issue.  Rising serum creatinine with addition of metolazone, increase Lasix.  Discussed with Dr. Haroldine Laws, planning now for initiation CVVH for volume  removal -Continue pressors, inotropes as ordered, weaning as able -Continue nitric oxide 20 ppm for now, consider weaning when respiratory status stabilized -On empiric antibiotics -Following BMP, electrolytes -Following CBC, goal hemoglobin 8.0   Daily Goals Checklist  Pain/Anxiety/Delirium protocol (if indicated): versed and fentanyl infusions.  VAP protocol (if indicated): N/A DVT prophylaxis: SCD GI prophylaxis: Protonix Glucose control: Basal and bolus insulin  Code Status: Full code Family Communication: Per primary team Disposition: ICU    Total critical care time: 31 minutes  Performed by: Baltazar Apo   Critical  care time was exclusive of separately billable procedures and treating other patients.   Critical care was necessary to treat or prevent imminent or life-threatening deterioration.   Critical care was time spent personally by me on the following activities: development of treatment plan with patient and/or surrogate as well as nursing, discussions with consultants, evaluation of patient's response to treatment, examination of patient, obtaining history from patient or surrogate, ordering and performing treatments and interventions, ordering and review of laboratory studies, ordering and review of radiographic studies, pulse oximetry and re-evaluation of patient's condition.   Baltazar Apo, MD, PhD 01/16/2020, 7:49 AM [facility] [phone] or if no answer [phone]

## 2020-01-16 NOTE — Progress Notes (Signed)
Patient ID: Corey Palmer, male   DOB: 1970/02/12, 49 y.o.   MRN: [id number] TCTS DAILY ICU PROGRESS NOTE                   [address]            [place] [address]          [phone]   3 Days Post-Op Procedure(s) (LRB): RE-EXPLORATION POST OPERATIVE OPEN HEART (N/A) TRANSESOPHAGEAL ECHOCARDIOGRAM (TEE) (N/A)  Total Length of Stay:  LOS: 17 days   Subjective: Remains on ventilator sedated   Objective: Vital signs in last 24 hours: Temp:  [98.78 F (37.1 C)-101.5 F (38.6 C)] 98.78 F (37.1 C) (12/19 0800) Pulse Rate:  [80-126] 90 (12/19 0800) Cardiac Rhythm: Sinus tachycardia (12/19 0800) Resp:  [19-25] 24 (12/19 0800) BP: (94)/(62) 94/62 (12/18 1508) SpO2:  [93 %-99 %] 97 % (12/19 0800) Arterial Line BP: (77-100)/(60-71) 91/65 (12/19 0800) FiO2 (%):  [40 %] 40 % (12/19 0800) Weight:  [126.9 kg] 126.9 kg (12/19 0600)  Filed Weights   01/14/20 0545 01/15/20 0500 01/16/20 0600  Weight: 125 kg 129.2 kg 126.9 kg    Weight change: -2.3 kg   Hemodynamic parameters for last 24 hours: PAP: (34-43)/(21-29) 34/22 CVP:  [16 mmHg-20 mmHg] 16 mmHg PCWP:  [5.2 mmHg] 5.2 mmHg CO:  [2.4 L/min-6.2 L/min] 6.2 L/min CI:  [2.1 L/min/m2-2.7 L/min/m2] 2.7 L/min/m2  Intake/Output from previous day: 12/18 0701 - 12/19 0700 In: 4367.9 [I.V.:2661.5; NG/GT:875.4; IV Piggyback:640] Out: 8850 [Urine:3043; Chest Tube:700]  Intake/Output this shift: Total I/O In: 7.8 [Other:7.8] Out: 100 [Urine:100]  Current Meds: Scheduled Meds: . acetaminophen  1,000 mg Oral Q6H   Or  . acetaminophen (TYLENOL) oral liquid 160 mg/5 mL  1,000 mg Per Tube Q6H  . aspirin  300 mg Rectal Daily  . atorvastatin  80 mg Per Tube Daily  . bisacodyl  10 mg Oral Daily   Or  . bisacodyl  10 mg Rectal Daily  . busPIRone  10 mg Per Tube TID  . chlorhexidine gluconate (MEDLINE KIT)  15 mL Mouth Rinse BID  . Chlorhexidine Gluconate Cloth  6 each Topical Daily  . docusate  100 mg Per Tube BID   . feeding supplement (PIVOT 1.5 CAL)  1,000 mL Per Tube Q24H  . fentaNYL (SUBLIMAZE) injection  25 mcg Intravenous Once  . FLUoxetine  40 mg Per Tube BID  . mouth rinse  15 mL Mouth Rinse 10 times per day  . metolazone  5 mg Per Tube BID  . pantoprazole sodium  40 mg Per Tube Daily  . polyethylene glycol  17 g Per Tube Daily  . potassium chloride  40 mEq Per Tube BID  . sodium chloride flush  10-40 mL Intracatheter Q12H  . sodium chloride flush  3 mL Intravenous Q12H  . valproic acid  250 mg Per Tube BID   Continuous Infusions: . sodium chloride Stopped (01/02/2020 1646)  . sodium chloride 10 mL/hr at 01/14/20 2101  . sodium chloride 10 mL/hr at 01/16/20 0700  . amiodarone 30 mg/hr (01/16/20 0700)  . epinephrine 4 mcg/min (01/16/20 0700)  . fentaNYL infusion INTRAVENOUS 100 mcg/hr (01/16/20 0700)  . furosemide (LASIX) 200 mg in dextrose 5% 100 mL (64m/mL) infusion 12 mg/hr (01/16/20 0700)  . impella catheter heparin 50 unit/mL in dextrose 5%    . insulin 3.6 mL/hr at 01/16/20 0700  . lactated ringers 20 mL/hr at 01/16/20 0700  . meropenem (MERREM)  IV Stopped (01/16/20 0546)  . midazolam Stopped (01/15/20 1301)  . milrinone 0.125 mcg/kg/min (01/16/20 0700)  . norepinephrine (LEVOPHED) Adult infusion 9 mcg/min (01/16/20 0714)  . vancomycin 150 mL/hr at 01/16/20 0700  . vasopressin 0.04 Units/min (01/16/20 0700)   PRN Meds:.sodium chloride, sodium chloride, dextrose, fentaNYL, fentaNYL (SUBLIMAZE) injection, fentaNYL (SUBLIMAZE) injection, metoprolol tartrate, midazolam, midazolam, midazolam, ondansetron (ZOFRAN) IV, oxyCODONE, sodium chloride flush, sodium chloride flush  General appearance: sedated on vent  Neurologic: sedated Heart: regular rate and rhythm Lungs: diminished breath sounds bilaterally Abdomen: not distended Extremities: no ischemic chages in fingers or toes  Wound: esmark in place   Lab Results: CBC: Recent Labs    01/15/20 1733 01/15/20 1741 01/16/20 0349  01/16/20 0427  WBC 17.7*  --  18.8*  --   HGB 8.6*   < > 8.6* 8.5*  HCT 26.4*   < > 26.2* 25.0*  PLT 171  --  187  --    < > = values in this interval not displayed.   BMET:  Recent Labs    01/15/20 1733 01/15/20 1741 01/16/20 0349 01/16/20 0427  NA 137   < > 138 140  K 3.6   < > 3.6 3.5  CL 104  --  104  --   CO2 24  --  24  --   GLUCOSE 153*  --  161*  --   BUN 32*  --  36*  --   CREATININE 1.85*  --  2.02*  --   CALCIUM 8.1*  --  8.0*  --    < > = values in this interval not displayed.    CMET: Lab Results  Component Value Date   WBC 18.8 (H) 01/16/2020   HGB 8.5 (L) 01/16/2020   HCT 25.0 (L) 01/16/2020   PLT 187 01/16/2020   GLUCOSE 161 (H) 01/16/2020   CHOL 176 10/01/2018   TRIG 126 10/01/2018   HDL 25 (L) 10/01/2018   LDLDIRECT 116.5 11/01/2011   LDLCALC 126 (H) 10/01/2018   ALT 335 (H) 01/16/2020   AST 350 (H) 01/16/2020   NA 140 01/16/2020   K 3.5 01/16/2020   CL 104 01/16/2020   CREATININE 2.02 (H) 01/16/2020   BUN 36 (H) 01/16/2020   CO2 24 01/16/2020   TSH 3.009 10/01/2018   INR 1.9 (H) 01/27/2020   HGBA1C 10.6 (H) 01/03/2020      PT/INR:  Recent Labs    01/04/2020 1041  LABPROT 21.5*  INR 1.9*   Radiology: No results found. DG Chest Port 1 View  Result Date: 01/15/2020 CLINICAL DATA:  Open cardiac surgery EXAM: PORTABLE CHEST 1 VIEW COMPARISON:  Chest radiograph from one day prior. FINDINGS: Skin staples overlie the right axilla. Right axillary approach Impella device is stable with tip overlying the left ventricle. Stable configuration of single lead left subclavian ICD. Enteric tube enters stomach with the tip not seen on this image. Endotracheal tube tip is 3.7 cm above the carina. Stable bibasilar chest tubes and pericardial drain. Right internal jugular Swan-Ganz catheter terminates over the main pulmonary artery, stable. Stable cardiomediastinal silhouette with mild to moderate cardiomegaly. No pneumothorax. Low lung volumes. No definite  pleural effusions. Mild pulmonary edema, similar. Left lung base opacities, similar. IMPRESSION: 1. Stable support structures. No pneumothorax. 2. Stable mild congestive heart failure. 3. Low lung volumes with left lung base opacities, favor atelectasis. Electronically Signed   By: Ilona Sorrel M.D.   On: 01/15/2020 11:47   DG Abd Portable  1V  Result Date: 01/14/2020 CLINICAL DATA:  Feeding tube placement EXAM: PORTABLE ABDOMEN - 1 VIEW COMPARISON:  04/03/2011 FINDINGS: Feeding tube passes through the stomach with the tip near the duodenal bulb. Normal bowel gas pattern. Chest findings reported separately. IMPRESSION: Feeding tube tip in the region of the duodenal bulb. No dilated bowel loops. Electronically Signed   By: Franchot Gallo M.D.   On: 01/14/2020 11:29   Assessment/Plan: S/P Procedure(s) (LRB): RE-EXPLORATION POST OPERATIVE OPEN HEART (N/A) TRANSESOPHAGEAL ECHOCARDIOGRAM (TEE) (N/A) Remains on full support with iimpella - was turned down Friday   But since has increasing cr now up to 2.02 Increasing episodes of desaturation  No ready to consider closing chest     Grace Isaac 01/16/2020 10:08 AM

## 2020-01-16 NOTE — Progress Notes (Signed)
[facility] for heparin Indication: Impella 5.5  Allergies  Allergen Reactions  . Orange Fruit Anaphylaxis, Hives and Other (See Comments)    Per Pt- Blisters around lips and Hives all over, also  . Penicillins Anaphylaxis    Did it involve swelling of the face/tongue/throat, SOB, or low BP? Yes Did it involve sudden or severe rash/hives, skin peeling, or any reaction on the inside of your mouth or nose? Yes Did you need to seek medical attention at a hospital or doctor's office? No When did it last happen?childhood If all above answers are "NO", may proceed with cephalosporin use.  [name] [Empagliflozin] Itching  . [name] [Insulin Glargine] Nausea And Vomiting    Patient Measurements: Height: 6\' 3"  (190.5 cm) Weight: 126.9 kg (279 lb 12.2 oz) IBW/kg (Calculated) : 84.5 Heparin dosing wt: 101 kg  Vital Signs: Temp: 98.78 F (37.1 C) (12/19 0800) Temp Source: Core (12/19 0800) Pulse Rate: 90 (12/19 0800)  Labs: Recent Labs    01/19/2020 1041 01/15/2020 1237 01/14/20 1009 01/14/20 1700 01/15/20 0513 01/15/20 0519 01/15/20 0622 01/15/20 1733 01/15/20 1741 01/16/20 0349 01/16/20 0427  HGB  --    < >  --    < >  --    < > 9.6* 8.6* 8.5* 8.6* 8.5*  HCT  --    < >  --    < >  --    < > 27.2* 26.4* 25.0* 26.2* 25.0*  PLT  --    < >  --    < >  --   --  173 171  --  187  --   LABPROT 21.5*  --   --   --   --   --   --   --   --   --   --   INR 1.9*  --   --   --   --   --   --   --   --   --   --   HEPARINUNFRC  --   --  <0.10*  --  <0.10*  --   --   --   --  <0.10*  --   CREATININE  --    < >  --    < >  --   --  1.74* 1.85*  --  2.02*  --    < > = values in this interval not displayed.    Estimated Creatinine Clearance: 62.1 mL/min (A) (by C-G formula based on SCr of 2.02 mg/dL (H)).   Assessment: 49 yo male s/p impella 5.5 placement. Pt started on heparinized purge and systemic heparin. Pt now s/p CABG 12/15 with  Impella remaining in place c/b significant bleeding and hematoma requiring opened chest and reexploration in OR 12/16.  Impella on P6 with stable pressures. Heparin running via purge only at this time, purge flow rate 7.8 ml/hr (approx 390 units/hr heparin). Heparin level undetectable as expected, CBC low with Hgb down from 9.6 to 8.5 this morning. Pltc stable at 187. No overt bleeding per discussion with nursing.  Goal of Therapy:  Heparin level 0.2-0.5 units/ml Monitor platelets by anticoagulation protocol: Yes   Plan:  Continue heparin via the purge Will follow with CVTS regarding systemic heparin once appropriate to initiate Daily heparin level for now  Richardine Service, PharmD, [facility] PGY2 Cardiology Pharmacy Resident Phone: [phone] 01/16/2020  9:48 AM  Please check [email or web address] for unit-specific pharmacy phone numbers.

## 2020-01-16 NOTE — Progress Notes (Signed)
Pharmacy Antibiotic Note  Corey Palmer is a 49 y.o. male admitted on 01/07/2020  s/p CABG with Impella placement, remains with open chest with concerns for possible sepsis. Pharmacy has been consulted for vancomycin and meropenem dosing.  WBC down slightly to 18.8 this morning. Still spiking fevers with Tm 101.5. Scr up slightly to 2.02. Starting CRRT today for volume removal in order to optimize for chest closure. Will adjust doses accordingly.  Plan: Adjust vancomycin to 1500 mg IV Q24 hrs Continue meropenem 1 gm IV q8 hrs Monitor renal function, cultures, clinical progression Check vancomycin levels as indicated.  Height: 6\' 3"  (190.5 cm) Weight: 126.9 kg (279 lb 12.2 oz) IBW/kg (Calculated) : 84.5  Temp (24hrs), Avg:100.4 F (38 C), Min:98.78 F (37.1 C), Max:101.5 F (38.6 C)  Recent Labs  Lab 01/14/20 0406 01/14/20 1700 01/15/20 0100 01/15/20 0513 01/15/20 0622 01/15/20 0935 01/15/20 1733 01/16/20 0349  WBC 17.0* 18.4* 19.0*  --  20.3*  --  17.7* 18.8*  CREATININE 1.61* 1.70* 1.65*  --  1.74*  --  1.85* 2.02*  LATICACIDVEN 1.2 1.3  --  1.1  --   --  1.1 1.0  VANCOTROUGH  --   --   --   --   --  26*  --   --     Estimated Creatinine Clearance: 62.1 mL/min (A) (by C-G formula based on SCr of 2.02 mg/dL (H)).    Allergies  Allergen Reactions  . Orange Fruit Anaphylaxis, Hives and Other (See Comments)    Per Pt- Blisters around lips and Hives all over, also  . Penicillins Anaphylaxis    Did it involve swelling of the face/tongue/throat, SOB, or low BP? Yes Did it involve sudden or severe rash/hives, skin peeling, or any reaction on the inside of your mouth or nose? Yes Did you need to seek medical attention at a hospital or doctor's office? No When did it last happen?childhood If all above answers are "NO", may proceed with cephalosporin use.  [name] [Empagliflozin] Itching  . [name] [Insulin Glargine] Nausea And Vomiting    Antimicrobials this  admission: Vancomycin 12/15 >>  Meropenem 12/16 >>   Dose adjustments this admission: 12/18: VT 26 - decrease to 750 mg IV q12 hrs 12/19: starting CRRT - adjust Vanc to 1500 mg IV q24 hrs  Microbiology results: 12/16 BCx: ngtd 12/14 BCx: ngtd  12/11 MRSA PCR: neg  Richardine Service, PharmD, BCPS PGY2 Cardiology Pharmacy Resident Phone: [phone] 01/16/2020  11:17 AM  Please check [email or web address] for unit-specific pharmacy phone numbers.

## 2020-01-16 NOTE — Consult Note (Signed)
Reason for Consult: Acute kidney injury on chronic kidney disease stage III, volume overload Referring Physician: Glori Bickers MD (cardiology)  HPI:  49 year old Caucasian man with past medical history significant for paroxysmal atrial fibrillation, nonischemic cardiomyopathy (EF 15%), type 2 diabetes mellitus and what appears to be baseline chronic kidney disease stage III (creatinine 1.3-1.7).  Admitted to the hospital 17 days ago with palpitations and increasing shortness of breath along with pleuritic chest pain that did not improve with sublingual nitroglycerin.  In the emergency room, found to have atrial fibrillation with rapid ventricular response and was cardioverted back to sinus rhythm in the emergency room.  He was also found to have acute kidney injury with a creatinine of 2.0 with hyperkalemia of 5.9.  He was consequently started on milrinone and Lasix drip with initial good response; had torsade with ICD shock on 12/6 possibly related to hypokalemia of 2.8.  Coronary angiography on 12/8 showed severe three-vessel disease.  Started on Impella/inotropes or cardiogenic shock and underwent redo sternotomy/four-vessel CABG on 12/15 leaving Impella in place.  Unfortunately, continue to have worsening cardiogenic shock with RV failure/tamponade physiology requiring bedside reexploration on 12/16 to evacuate hematoma followed by mediastinal washout in the OR.  Since then, he has had an open chest with Impella in place with episodes of oxygen desaturation and fair response to diuretic therapy.  Nephrology service consulted for extracorporeal volume unloading to optimize the patient for secondary wound closure.  Past Medical History:  Diagnosis Date  . Anginal pain ([employer] [employer])   . Anxiety   . Arthritis   . Automatic implantable cardioverter-defibrillator in situ 2007   [employer]   . Bipolar disorder ([place])   . CAD (coronary artery disease)    Cardiac cath (08/19/2000) - Nonobstructive, 40% stenosis  of LAD,.   . Cancer [facility]) 2013   benign stomach cancer  . CHF (congestive heart failure) ([place])   . Chondrocalcinosis of right knee 06/11/2013  . Chronic systolic heart failure ([place])    2D echo (34/2876) - Systolic function severely reduced., LV EF 20-25%. Akinesis of apical and anteroseptal mycoardium.  last 2D echo (11/2008 ), LV EF 25-30%, diffuse hypokinesis, and grade 1 diastolic dysfunction.  . Depression   . Diabetes uncomplicated adult-type II    on insulin therapy, a1c 12.9 in 01/2011  . Dilated cardiomyopathy ([place])    status post ICD placement in 2008 c/b tearing at the atrial junction resulting in tamponade and urgent thoracotomy  . Dyslipidemia   . Dysrhythmia   . GERD (gastroesophageal reflux disease)   . Gout, unspecified   . Hepatic steatosis 2011   seen on ultrasound.   . History of kidney stones   . Hx of echocardiogram    Echo (04/2013):  Mild LVH, EF 20-25%, mild LAE, mild to mod RVE with mild reduced RVSF.  Marland Kitchen Hypertension   . Obesity (BMI 30.0-34.9)   . Pacemaker   . Paroxysmal atrial fibrillation ([facility])    on chronic coumadin, goal INR 2-3. status post direct current  . Peptic ulcer   . Shortness of breath   . Sleep apnea    does not have CPAP    Past Surgical History:  Procedure Laterality Date  . BEDSIDE EVACUATION OF HEMATOMA N/A 01/10/2020   Procedure: CHEST EXPLORATION $RemoveBeforeDEI'@2H12'[id number]$ ;  Surgeon: Ivin Poot, MD;  Location: [place];  Service: Cardiovascular;  Laterality: N/A;  . BEDSIDE EVACUATION OF HEMATOMA N/A 12/29/2019   Procedure: CHEST EXPLORATION $RemoveBeforeDEI'@2H12'[id number]$ ;  Surgeon: [name], MD;  Location: [facility];  Service: Open Heart Surgery;  Laterality: N/A;  . BIOPSY  12/01/2019   Procedure: BIOPSY;  Surgeon: Arta Silence, MD;  Location: [facility];  Service: Endoscopy;;  . CARDIAC CATHETERIZATION    . CARDIAC PACEMAKER PLACEMENT    . CHOLECYSTECTOMY    . COLONOSCOPY    . CORONARY ARTERY BYPASS GRAFT    . CORONARY ARTERY BYPASS GRAFT N/A 01/24/2020    Procedure: CORONARY ARTERY BYPASS GRAFTING (CABG), ON PUMP, TIMES FOUR, USING LEFT INTERNAL MAMMARY ARTERY AND ENDOSCOPICALLY HARVESTED RIGHT GREATER SAPHENOUS VEIN;  Surgeon: [name], MD;  Location: [place];  Service: Open Heart Surgery;  Laterality: N/A;  . ESOPHAGOGASTRODUODENOSCOPY  04/05/2011   Procedure: ESOPHAGOGASTRODUODENOSCOPY (EGD);  Surgeon: Irene Shipper, MD;  Location[facility];  Service: Endoscopy;  Laterality: N/A;  . ESOPHAGOGASTRODUODENOSCOPY (EGD) WITH PROPOFOL N/A 12/01/2019   Procedure: ESOPHAGOGASTRODUODENOSCOPY (EGD) WITH PROPOFOL;  Surgeon: Arta Silence, MD;  Location: [facility];  Service: Endoscopy;  Laterality: N/A;  . EXPLORATION POST OPERATIVE OPEN HEART N/A 01/01/2020   Procedure: RE-EXPLORATION POST OPERATIVE OPEN HEART;  Surgeon: [name], MD;  Location: [place];  Service: Open Heart Surgery;  Laterality: N/A;  . JOINT REPLACEMENT     left knee replacement  . KNEE ARTHROSCOPY Right 06/11/2013   Procedure: RIGHT KNEE ARTHROSCOPY with chondroplasty;  Surgeon: Johnny Bridge, MD;  Location: [place];  Service: Orthopedics;  Laterality: Right;  . PLACEMENT OF IMPELLA LEFT VENTRICULAR ASSIST DEVICE N/A 01/01/2020   Procedure: PLACEMENT OF IMPELLA LEFT VENTRICULAR ASSIST DEVICE;  Surgeon: [name], MD;  Location: [place];  Service: Open Heart Surgery;  Laterality: N/A;  . PLACEMENT OF IMPELLA LEFT VENTRICULAR ASSIST DEVICE Right 01/22/2020   Procedure: AXILLARY  ARTERY PLACEMENT OF ABIOMED IMPELLA  5.5 LEFT VENTRICULAR ASSIST DEVICE;  Surgeon: [name], MD;  Location: [place];  Service: Open Heart Surgery;  Laterality: Right;  . RIGHT/LEFT HEART CATH AND CORONARY ANGIOGRAPHY N/A 01/02/2020   Procedure: RIGHT/LEFT HEART CATH AND CORONARY ANGIOGRAPHY;  Surgeon: Larey Dresser, MD;  Location: [place] CV [facility];  Service: Cardiovascular;  Laterality: N/A;  . TEE WITHOUT CARDIOVERSION N/A 01/15/2020   Procedure: TRANSESOPHAGEAL ECHOCARDIOGRAM  (TEE);  Surgeon: [name], MD;  Location: [place];  Service: Open Heart Surgery;  Laterality: N/A;  . TEE WITHOUT CARDIOVERSION N/A 01/11/2020   Procedure: TRANSESOPHAGEAL ECHOCARDIOGRAM (TEE);  Surgeon: [name], MD;  Location: [place];  Service: Open Heart Surgery;  Laterality: N/A;  . TOTAL KNEE ARTHROPLASTY Left     Family History  Problem Relation Age of Onset  . Diabetes Mother   . Coronary artery disease Mother 38  . Gout Mother   . Heart disease Mother   . Heart attack Mother 20  . Diabetes Father   . Coronary artery disease Father 45       Died in a house fire   . Heart disease Father   . Heart disease Brother     Social History:  reports that he has never smoked. He has never used smokeless tobacco. He reports that he does not drink alcohol and does not use drugs.  Allergies:  Allergies  Allergen Reactions  . Orange Fruit Anaphylaxis, Hives and Other (See Comments)    Per Pt- Blisters around lips and Hives all over, also  . Penicillins Anaphylaxis    Did it involve swelling of the face/tongue/throat, SOB, or low BP? Yes Did it involve sudden or severe rash/hives,  skin peeling, or any reaction on the inside of your mouth or nose? Yes Did you need to seek medical attention at a hospital or doctor's office? No When did it last happen?childhood If all above answers are "NO", may proceed with cephalosporin use.  [name] [Empagliflozin] Itching  . [name] [Insulin Glargine] Nausea And Vomiting    Medications:  Scheduled: . acetaminophen  1,000 mg Oral Q6H   Or  . acetaminophen (TYLENOL) oral liquid 160 mg/5 mL  1,000 mg Per Tube Q6H  . aspirin  300 mg Rectal Daily  . atorvastatin  80 mg Per Tube Daily  . bisacodyl  10 mg Oral Daily   Or  . bisacodyl  10 mg Rectal Daily  . busPIRone  10 mg Per Tube TID  . chlorhexidine gluconate (MEDLINE KIT)  15 mL Mouth Rinse BID  . Chlorhexidine Gluconate Cloth  6 each Topical Daily  . docusate   100 mg Per Tube BID  . feeding supplement (PIVOT 1.5 CAL)  1,000 mL Per Tube Q24H  . fentaNYL (SUBLIMAZE) injection  25 mcg Intravenous Once  . FLUoxetine  40 mg Per Tube BID  . mouth rinse  15 mL Mouth Rinse 10 times per day  . metolazone  5 mg Per Tube BID  . pantoprazole sodium  40 mg Per Tube Daily  . polyethylene glycol  17 g Per Tube Daily  . potassium chloride  40 mEq Per Tube BID  . sodium chloride flush  10-40 mL Intracatheter Q12H  . sodium chloride flush  3 mL Intravenous Q12H  . valproic acid  250 mg Per Tube BID    BMP Latest Ref Rng & Units 01/16/2020 01/16/2020 01/15/2020  Glucose 70 - 99 mg/dL - 161(H) -  BUN 6 - 20 mg/dL - 36(H) -  Creatinine 0.61 - 1.24 mg/dL - 2.02(H) -  BUN/Creat Ratio 9 - 20 - - -  Sodium 135 - 145 mmol/L 140 138 139  Potassium 3.5 - 5.1 mmol/L 3.5 3.6 3.6  Chloride 98 - 111 mmol/L - 104 -  CO2 22 - 32 mmol/L - 24 -  Calcium 8.9 - 10.3 mg/dL - 8.0(L) -   CBC Latest Ref Rng & Units 01/16/2020 01/16/2020 01/15/2020  WBC 4.0 - 10.5 K/uL - 18.8(H) -  Hemoglobin 13.0 - 17.0 g/dL 8.5(L) 8.6(L) 8.5(L)  Hematocrit 39.0 - 52.0 % 25.0(L) 26.2(L) 25.0(L)  Platelets 150 - 400 K/uL - 187 -     DG Chest Port 1 View  Result Date: 01/15/2020 CLINICAL DATA:  Open cardiac surgery EXAM: PORTABLE CHEST 1 VIEW COMPARISON:  Chest radiograph from one day prior. FINDINGS: Skin staples overlie the right axilla. Right axillary approach Impella device is stable with tip overlying the left ventricle. Stable configuration of single lead left subclavian ICD. Enteric tube enters stomach with the tip not seen on this image. Endotracheal tube tip is 3.7 cm above the carina. Stable bibasilar chest tubes and pericardial drain. Right internal jugular Swan-Ganz catheter terminates over the main pulmonary artery, stable. Stable cardiomediastinal silhouette with mild to moderate cardiomegaly. No pneumothorax. Low lung volumes. No definite pleural effusions. Mild pulmonary edema,  similar. Left lung base opacities, similar. IMPRESSION: 1. Stable support structures. No pneumothorax. 2. Stable mild congestive heart failure. 3. Low lung volumes with left lung base opacities, favor atelectasis. Electronically Signed   By: Ilona Sorrel M.D.   On: 01/15/2020 11:47   DG Abd Portable 1V  Result Date: 01/14/2020 CLINICAL DATA:  Feeding tube  placement EXAM: PORTABLE ABDOMEN - 1 VIEW COMPARISON:  04/03/2011 FINDINGS: Feeding tube passes through the stomach with the tip near the duodenal bulb. Normal bowel gas pattern. Chest findings reported separately. IMPRESSION: Feeding tube tip in the region of the duodenal bulb. No dilated bowel loops. Electronically Signed   By: Franchot Gallo M.D.   On: 01/14/2020 11:29    Review of Systems  Unable to perform ROS: Intubated   Blood pressure 94/62, pulse 90, temperature 98.78 F (37.1 C), temperature source Core, resp. rate (!) 24, height $RemoveBe'6\' 3"'qiuqGZcCQ$  (1.905 m), weight 126.9 kg, SpO2 97 %. Physical Exam Vitals and nursing note reviewed.  Constitutional:      Appearance: Normal appearance. He is obese.     Comments: Intubated, sedated  HENT:     Head: Normocephalic and atraumatic.     Right Ear: External ear normal.     Left Ear: External ear normal.     Nose: Nose normal.     Mouth/Throat:     Comments: Intubated Eyes:     Conjunctiva/sclera: Conjunctivae normal.  Cardiovascular:     Rate and Rhythm: Normal rate and regular rhythm.  Pulmonary:     Breath sounds: No wheezing or rhonchi.     Comments: Anteriorly distant breath sounds bilaterally.  Chest tubes in situ.  Right upper chest Impella driveline. Abdominal:     General: Abdomen is flat. There is no distension.     Palpations: Abdomen is soft.     Tenderness: There is no abdominal tenderness.  Musculoskeletal:     Cervical back: Normal range of motion and neck supple.     Right lower leg: Edema present.     Left lower leg: Edema present.     Comments: 2+ bilateral lower  extremity edema  Skin:    General: Skin is warm.     Coloration: Skin is pale.     Assessment/Plan: 1.  Acute kidney injury on chronic kidney disease stage III: This appears to have been hemodynamically mediated from cardiogenic shock status post acute events post CABG.  Although he does have a decent response to furosemide drip in the setting of ongoing Impella support, he is determined to benefit from additional extracorporeal volume unloading to help optimize volume status for secondary wound closure-I will begin him on CRRT at this time with a goal net ultrafiltration of 100 to 200 cc/h.  He is currently 50 pounds over his "dry weight" of around 225 pounds last seen on 12/11. Avoid nephrotoxic medications including NSAIDs and iodinated intravenous contrast exposure unless the latter is absolutely indicated.  Preferred narcotic agents for pain control are hydromorphone, fentanyl, and methadone. Morphine should not be used. Avoid Baclofen and avoid oral sodium phosphate and magnesium citrate based laxatives / bowel preps. Continue strict Input and Output monitoring. Will monitor the patient closely with you and intervene or adjust therapy as indicated by changes in clinical status/labs. 2.  Status post CABG with evacuation of post op hematoma: Currently with open chest, awaiting optimization of volume status for secondary closure. 3.  Cardiogenic shock: With underlying history suggestive of familial cardiomyopathy and now status post redo CABG.  Remains on pressors with Levophed, epinephrine and vasopressin.  Attempt ultrafiltration with CRRT. 4.  Acute blood loss anemia: Status post admission with postoperative bleeding into chest cavity/CABG bed.  Status post PRBC transfusion with seemingly stable hemoglobin and hematocrit. 5.  Acute on chronic hypoxic respiratory failure: With ventilator dependent respiratory failure status post CABG.  Begin  CRRT with efforts at volume unloading to try and improve  oxygenation.  Breauna Mazzeo K. 01/16/2020, 10:34 AM

## 2020-01-17 ENCOUNTER — Inpatient Hospital Stay (HOSPITAL_COMMUNITY): Payer: BC Managed Care – PPO

## 2020-01-17 ENCOUNTER — Ambulatory Visit (HOSPITAL_COMMUNITY): Payer: BC Managed Care – PPO | Attending: Physician Assistant

## 2020-01-17 ENCOUNTER — Encounter (HOSPITAL_COMMUNITY): Payer: Self-pay

## 2020-01-17 LAB — RENAL FUNCTION PANEL
Albumin: 2 g/dL — ABNORMAL LOW (ref 3.5–5.0)
Albumin: 2.1 g/dL — ABNORMAL LOW (ref 3.5–5.0)
Anion gap: 12 (ref 5–15)
Anion gap: 9 (ref 5–15)
BUN: 33 mg/dL — ABNORMAL HIGH (ref 6–20)
BUN: 30 mg/dL — ABNORMAL HIGH (ref 6–20)
CO2: 24 mmol/L (ref 22–32)
CO2: 25 mmol/L (ref 22–32)
Calcium: 8 mg/dL — ABNORMAL LOW (ref 8.9–10.3)
Calcium: 7.8 mg/dL — ABNORMAL LOW (ref 8.9–10.3)
Chloride: 101 mmol/L (ref 98–111)
Chloride: 102 mmol/L (ref 98–111)
Creatinine, Ser: 1.74 mg/dL — ABNORMAL HIGH (ref 0.61–1.24)
Creatinine, Ser: 1.66 mg/dL — ABNORMAL HIGH (ref 0.61–1.24)
GFR, Estimated: 47 mL/min — ABNORMAL LOW (ref >60)
GFR, Estimated: 50 mL/min — ABNORMAL LOW (ref >60)
Glucose, Bld: 160 mg/dL — ABNORMAL HIGH (ref 70–99)
Glucose, Bld: 149 mg/dL — ABNORMAL HIGH (ref 70–99)
Phosphorus: 3.1 mg/dL (ref 2.5–4.6)
Phosphorus: 2.7 mg/dL (ref 2.5–4.6)
Potassium: 3.7 mmol/L (ref 3.5–5.1)
Potassium: 3.8 mmol/L (ref 3.5–5.1)
Sodium: 137 mmol/L (ref 135–145)
Sodium: 136 mmol/L (ref 135–145)

## 2020-01-17 LAB — POCT I-STAT 7, (LYTES, BLD GAS, ICA,H+H)
Acid-Base Excess: 1 mmol/L (ref 0.0–2.0)
Acid-Base Excess: 0 mmol/L (ref 0.0–2.0)
Acid-Base Excess: 1 mmol/L (ref 0.0–2.0)
Acid-Base Excess: 1 mmol/L (ref 0.0–2.0)
Bicarbonate: 26.9 mmol/L (ref 20.0–28.0)
Bicarbonate: 26.3 mmol/L (ref 20.0–28.0)
Bicarbonate: 27.3 mmol/L (ref 20.0–28.0)
Bicarbonate: 27.2 mmol/L (ref 20.0–28.0)
Calcium, Ion: 1.2 mmol/L (ref 1.15–1.40)
Calcium, Ion: 1.2 mmol/L (ref 1.15–1.40)
Calcium, Ion: 1.16 mmol/L (ref 1.15–1.40)
Calcium, Ion: 1.19 mmol/L (ref 1.15–1.40)
HCT: 25 % — ABNORMAL LOW (ref 39.0–52.0)
HCT: 26 % — ABNORMAL LOW (ref 39.0–52.0)
HCT: 27 % — ABNORMAL LOW (ref 39.0–52.0)
HCT: 26 % — ABNORMAL LOW (ref 39.0–52.0)
Hemoglobin: 8.5 g/dL — ABNORMAL LOW (ref 13.0–17.0)
Hemoglobin: 8.8 g/dL — ABNORMAL LOW (ref 13.0–17.0)
Hemoglobin: 9.2 g/dL — ABNORMAL LOW (ref 13.0–17.0)
Hemoglobin: 8.8 g/dL — ABNORMAL LOW (ref 13.0–17.0)
O2 Saturation: 100 %
O2 Saturation: 95 %
O2 Saturation: 100 %
O2 Saturation: 97 %
Patient temperature: 37.4
Patient temperature: 36
Patient temperature: 36.5
Patient temperature: 36.6
Potassium: 3.3 mmol/L — ABNORMAL LOW (ref 3.5–5.1)
Potassium: 3.3 mmol/L — ABNORMAL LOW (ref 3.5–5.1)
Potassium: 3.8 mmol/L (ref 3.5–5.1)
Potassium: 3.9 mmol/L (ref 3.5–5.1)
Sodium: 141 mmol/L (ref 135–145)
Sodium: 139 mmol/L (ref 135–145)
Sodium: 139 mmol/L (ref 135–145)
Sodium: 139 mmol/L (ref 135–145)
TCO2: 28 mmol/L (ref 22–32)
TCO2: 28 mmol/L (ref 22–32)
TCO2: 29 mmol/L (ref 22–32)
TCO2: 29 mmol/L (ref 22–32)
pCO2 arterial: 50.2 mmHg — ABNORMAL HIGH (ref 32.0–48.0)
pCO2 arterial: 45.8 mmHg (ref 32.0–48.0)
pCO2 arterial: 49.4 mmHg — ABNORMAL HIGH (ref 32.0–48.0)
pCO2 arterial: 52.6 mmHg — ABNORMAL HIGH (ref 32.0–48.0)
pH, Arterial: 7.339 — ABNORMAL LOW (ref 7.350–7.450)
pH, Arterial: 7.362 (ref 7.350–7.450)
pH, Arterial: 7.347 — ABNORMAL LOW (ref 7.350–7.450)
pH, Arterial: 7.32 — ABNORMAL LOW (ref 7.350–7.450)
pO2, Arterial: 232 mmHg — ABNORMAL HIGH (ref 83.0–108.0)
pO2, Arterial: 75 mmHg — ABNORMAL LOW (ref 83.0–108.0)
pO2, Arterial: 252 mmHg — ABNORMAL HIGH (ref 83.0–108.0)
pO2, Arterial: 96 mmHg (ref 83.0–108.0)

## 2020-01-17 LAB — CBC
HCT: 27.6 % — ABNORMAL LOW (ref 39.0–52.0)
HCT: 28.6 % — ABNORMAL LOW (ref 39.0–52.0)
HCT: 27.2 % — ABNORMAL LOW (ref 39.0–52.0)
Hemoglobin: 8.9 g/dL — ABNORMAL LOW (ref 13.0–17.0)
Hemoglobin: 9.1 g/dL — ABNORMAL LOW (ref 13.0–17.0)
Hemoglobin: 8.7 g/dL — ABNORMAL LOW (ref 13.0–17.0)
MCH: 28.7 pg (ref 26.0–34.0)
MCH: 28.8 pg (ref 26.0–34.0)
MCH: 29 pg (ref 26.0–34.0)
MCHC: 32.2 g/dL (ref 30.0–36.0)
MCHC: 31.8 g/dL (ref 30.0–36.0)
MCHC: 32 g/dL (ref 30.0–36.0)
MCV: 89 fL (ref 80.0–100.0)
MCV: 90.5 fL (ref 80.0–100.0)
MCV: 90.7 fL (ref 80.0–100.0)
Platelets: 207 10*3/uL (ref 150–400)
Platelets: 222 10*3/uL (ref 150–400)
Platelets: 207 10*3/uL (ref 150–400)
RBC: 3.1 MIL/uL — ABNORMAL LOW (ref 4.22–5.81)
RBC: 3.16 MIL/uL — ABNORMAL LOW (ref 4.22–5.81)
RBC: 3 MIL/uL — ABNORMAL LOW (ref 4.22–5.81)
RDW: 18.3 % — ABNORMAL HIGH (ref 11.5–15.5)
RDW: 18.2 % — ABNORMAL HIGH (ref 11.5–15.5)
RDW: 18.5 % — ABNORMAL HIGH (ref 11.5–15.5)
WBC: 19.1 10*3/uL — ABNORMAL HIGH (ref 4.0–10.5)
WBC: 19.8 10*3/uL — ABNORMAL HIGH (ref 4.0–10.5)
WBC: 16.8 10*3/uL — ABNORMAL HIGH (ref 4.0–10.5)
nRBC: 1.2 % — ABNORMAL HIGH (ref 0.0–0.2)
nRBC: 1.1 % — ABNORMAL HIGH (ref 0.0–0.2)
nRBC: 1.7 % — ABNORMAL HIGH (ref 0.0–0.2)

## 2020-01-17 LAB — GLUCOSE, CAPILLARY
Glucose-Capillary: 114 mg/dL — ABNORMAL HIGH (ref 70–99)
Glucose-Capillary: 117 mg/dL — ABNORMAL HIGH (ref 70–99)
Glucose-Capillary: 118 mg/dL — ABNORMAL HIGH (ref 70–99)
Glucose-Capillary: 119 mg/dL — ABNORMAL HIGH (ref 70–99)
Glucose-Capillary: 122 mg/dL — ABNORMAL HIGH (ref 70–99)
Glucose-Capillary: 137 mg/dL — ABNORMAL HIGH (ref 70–99)
Glucose-Capillary: 137 mg/dL — ABNORMAL HIGH (ref 70–99)
Glucose-Capillary: 137 mg/dL — ABNORMAL HIGH (ref 70–99)
Glucose-Capillary: 138 mg/dL — ABNORMAL HIGH (ref 70–99)
Glucose-Capillary: 141 mg/dL — ABNORMAL HIGH (ref 70–99)
Glucose-Capillary: 141 mg/dL — ABNORMAL HIGH (ref 70–99)
Glucose-Capillary: 142 mg/dL — ABNORMAL HIGH (ref 70–99)
Glucose-Capillary: 143 mg/dL — ABNORMAL HIGH (ref 70–99)
Glucose-Capillary: 145 mg/dL — ABNORMAL HIGH (ref 70–99)
Glucose-Capillary: 147 mg/dL — ABNORMAL HIGH (ref 70–99)
Glucose-Capillary: 151 mg/dL — ABNORMAL HIGH (ref 70–99)
Glucose-Capillary: 154 mg/dL — ABNORMAL HIGH (ref 70–99)
Glucose-Capillary: 156 mg/dL — ABNORMAL HIGH (ref 70–99)
Glucose-Capillary: 157 mg/dL — ABNORMAL HIGH (ref 70–99)
Glucose-Capillary: 157 mg/dL — ABNORMAL HIGH (ref 70–99)
Glucose-Capillary: 161 mg/dL — ABNORMAL HIGH (ref 70–99)
Glucose-Capillary: 162 mg/dL — ABNORMAL HIGH (ref 70–99)
Glucose-Capillary: 162 mg/dL — ABNORMAL HIGH (ref 70–99)
Glucose-Capillary: 165 mg/dL — ABNORMAL HIGH (ref 70–99)
Glucose-Capillary: 167 mg/dL — ABNORMAL HIGH (ref 70–99)
Glucose-Capillary: 171 mg/dL — ABNORMAL HIGH (ref 70–99)
Glucose-Capillary: 177 mg/dL — ABNORMAL HIGH (ref 70–99)
Glucose-Capillary: 91 mg/dL (ref 70–99)
Glucose-Capillary: 94 mg/dL (ref 70–99)

## 2020-01-17 LAB — HEPATIC FUNCTION PANEL
ALT: 262 U/L — ABNORMAL HIGH (ref 0–44)
AST: 172 U/L — ABNORMAL HIGH (ref 15–41)
Albumin: 2.1 g/dL — ABNORMAL LOW (ref 3.5–5.0)
Alkaline Phosphatase: 168 U/L — ABNORMAL HIGH (ref 38–126)
Bilirubin, Direct: 0.6 mg/dL — ABNORMAL HIGH (ref 0.0–0.2)
Indirect Bilirubin: 0.7 mg/dL (ref 0.3–0.9)
Total Bilirubin: 1.3 mg/dL — ABNORMAL HIGH (ref 0.3–1.2)
Total Protein: 5.5 g/dL — ABNORMAL LOW (ref 6.5–8.1)

## 2020-01-17 LAB — COOXEMETRY PANEL
Carboxyhemoglobin: 1.3 % (ref 0.5–1.5)
Carboxyhemoglobin: 1.4 % (ref 0.5–1.5)
Methemoglobin: 0.7 % (ref 0.0–1.5)
Methemoglobin: 1.1 % (ref 0.0–1.5)
O2 Saturation: 64.4 %
O2 Saturation: 53.8 %
Total hemoglobin: 12.4 g/dL (ref 12.0–16.0)
Total hemoglobin: 12.5 g/dL (ref 12.0–16.0)

## 2020-01-17 LAB — LACTATE DEHYDROGENASE: LDH: 339 U/L — ABNORMAL HIGH (ref 98–192)

## 2020-01-17 LAB — LACTIC ACID, PLASMA
Lactic Acid, Venous: 0.9 mmol/L (ref 0.5–1.9)
Lactic Acid, Venous: 0.7 mmol/L (ref 0.5–1.9)

## 2020-01-17 LAB — MAGNESIUM: Magnesium: 2.1 mg/dL (ref 1.7–2.4)

## 2020-01-17 LAB — HEPARIN LEVEL (UNFRACTIONATED): Heparin Unfractionated: <0.1 IU/mL — ABNORMAL LOW (ref 0.30–0.70)

## 2020-01-17 MED ORDER — ASPIRIN 81 MG PO CHEW
324.0000 mg | CHEWABLE_TABLET | Freq: Every day | ORAL | Status: DC
  Administered 2020-01-17 – 2020-03-07 (×51): 324 mg
  Filled 2020-01-17 (×51): qty 4

## 2020-01-17 MED ORDER — POTASSIUM CHLORIDE 10 MEQ/50ML IV SOLN
10.0000 meq | INTRAVENOUS | Status: AC
  Administered 2020-01-17 (×2): 10 meq via INTRAVENOUS
  Filled 2020-01-17 (×2): qty 50

## 2020-01-17 NOTE — Progress Notes (Signed)
EVENING ROUNDS NOTE :     [address]       [place] [address]             [phone]                 4 Days Post-Op Procedure(s) (LRB): RE-EXPLORATION POST OPERATIVE OPEN HEART (N/A) TRANSESOPHAGEAL ECHOCARDIOGRAM (TEE) (N/A)   Total Length of Stay:  LOS: 18 days  Events:   No events Off lasix gtt Decreased fluid pull to 100/hr On versed now Stable hemodynamics     BP (!) 82/58   Pulse 95   Temp 97.7 F (36.5 C)   Resp (!) 24   Ht 6\' 3"  (1.905 m)   Wt 121.6 kg   SpO2 99%   BMI 33.51 kg/m   PAP: (31-45)/(13-27) 37/20 CVP:  [12 mmHg-21 mmHg] 12 mmHg CO:  [5.1 L/min-6.8 L/min] 5.5 L/min CI:  [2.2 L/min/m2-2.9 L/min/m2] 2.4 L/min/m2  Vent Mode: PRVC FiO2 (%):  [40 %] 40 % Set Rate:  [24 bmp] 24 bmp Vt Set:  [510 mL] 510 mL PEEP:  [5 cmH20] 5 cmH20 Pressure Support:  [5 cmH20] 5 cmH20 Plateau Pressure:  [24 cmH20-26 cmH20] 25 cmH20  .  prismasol BGK 4/2.5 500 mL/hr at 01/16/20 1136  .  prismasol BGK 4/2.5 200 mL/hr at 01/17/20 1118  . sodium chloride Stopped (12/30/2019 1646)  . sodium chloride 10 mL/hr at 01/14/20 2101  . sodium chloride Stopped (01/16/20 1032)  . amiodarone 30 mg/hr (01/17/20 1515)  . epinephrine 4 mcg/min (01/17/20 1500)  . fentaNYL infusion INTRAVENOUS 150 mcg/hr (01/17/20 1500)  . impella catheter heparin 50 unit/mL in dextrose 5%    . insulin 6 mL/hr at 01/17/20 1500  . lactated ringers 20 mL/hr at 01/17/20 1500  . meropenem (MERREM) IV Stopped (01/17/20 1409)  . midazolam 3 mg/hr (01/17/20 1500)  . milrinone 0.125 mcg/kg/min (01/17/20 1500)  . norepinephrine (LEVOPHED) Adult infusion 19 mcg/min (01/17/20 1500)  . prismasol BGK 4/2.5 1,500 mL/hr at 01/17/20 1455  . vancomycin Stopped (01/16/20 1908)  . vasopressin 0.04 Units/min (01/17/20 1521)    I/O last 3 completed shifts: In: 7459.9 [P.O.:15; I.V.:4064.3; Other:280.6; NG/GT:1950; IV Piggyback:1150] Out: 11900 [Urine:2758; RWERX:5400; Chest  Tube:840]   CBC Latest Ref Rng & Units 01/17/2020 01/17/2020 01/17/2020  WBC 4.0 - 10.5 K/uL 19.8(H) 19.1(H) -  Hemoglobin 13.0 - 17.0 g/dL 9.1(L) 8.9(L) 9.2(L)  Hematocrit 39.0 - 52.0 % 28.6(L) 27.6(L) 27.0(L)  Platelets 150 - 400 K/uL 222 207 -    BMP Latest Ref Rng & Units 01/17/2020 01/17/2020 01/16/2020  Glucose 70 - 99 mg/dL 160(H) - -  BUN 6 - 20 mg/dL 33(H) - -  Creatinine 0.61 - 1.24 mg/dL 1.74(H) - -  BUN/Creat Ratio 9 - 20 - - -  Sodium 135 - 145 mmol/L 137 139 139  Potassium 3.5 - 5.1 mmol/L 3.7 3.8 3.3(L)  Chloride 98 - 111 mmol/L 101 - -  CO2 22 - 32 mmol/L 24 - -  Calcium 8.9 - 10.3 mg/dL 8.0(L) - -    ABG    Component Value Date/Time   PHART 7.347 (L) 01/17/2020 0314   PCO2ART 49.4 (H) 01/17/2020 0314   PO2ART 252 (H) 01/17/2020 0314   HCO3 27.3 01/17/2020 0314   TCO2 29 01/17/2020 0314   ACIDBASEDEF 2.0 01/14/2020 1722   O2SAT 53.8 01/17/2020 0834       Melodie Bouillon, MD 01/17/2020 3:22 PM

## 2020-01-17 NOTE — Addendum Note (Signed)
Addendum  created 01/17/20 1020 by Josephine Igo, CRNA   Order list changed, Pharmacy for encounter modified

## 2020-01-17 NOTE — Plan of Care (Signed)
  Problem: Cardiac: Goal: Ability to achieve and maintain adequate cardiopulmonary perfusion will improve Outcome: Progressing   Problem: Clinical Measurements: Goal: Will remain free from infection Outcome: Progressing  Goal: Diagnostic test results will improve 01/17/2020 0615 by Sherry Ruffing, RN Outcome: Progressing   Problem: Activity: Goal: Risk for activity intolerance will decrease Outcome: Not Progressing   Problem: Coping: Goal: Level of anxiety will decrease 01/17/2020 0615 by Sherry Ruffing, RN Outcome: Progressing 01/17/2020 0614 by Sherry Ruffing, RN Outcome: Progressing   Problem: Skin Integrity: Goal: Risk for impaired skin integrity will decrease Outcome: Progressing

## 2020-01-17 NOTE — Progress Notes (Addendum)
Patient ID: Corey Palmer, male   DOB: 14-Jun-1970, 49 y.o.   MRN: [id number]     Advanced Heart Failure Rounding Note  PCP-Cardiologist: No primary care provider on file.   Subjective:    - 12/7 Torsades -ICD shock x1.  - 12/11 Impella 5.5 placed.  - 12/12 Impella repositioned in OR - 12/15 CABG x 4 with LIMA-LAD, SVG-PDA/PLV, SVG-OM - 12/16 DCCV afib.  Back to OR to re-open chest and again to evacuate hematoma.   Intubated/sedated. Chest open. Impella at P-8. Flow 4.5. Good waveforms. No alarms On NE 17. Epi 4, VP 0.04, milrinone 0.125 and iNO 21.    CVVH initiated yesterday for fluid removal. 300/hr pulled overnight. Also on lasix gtt at 20/hr. -8.3L in fluid removal through CVVH. -1.6L in UOP. K 3.7. Wt down 11 lb. CVP 14    Remains on meropenem and vancomycin. WBC 20>>19K. AF   Swan numbers: CVP 14 PA 37/22 (28) Co/CI 6.3/2.7 Co-ox 64% PAPi 1.1   Impella 5.5 P-8 Flow 4.5 Heparin only in purge  Echo (12/10): EF <20%, moderate LV dilation, normal RV size with mildly decreased systolic function.    Objective:   Weight Range: 121.6 kg Body mass index is 33.51 kg/m.   Vital Signs:   Temp:  [96.6 F (35.9 C)-99.5 F (37.5 C)] 98.42 F (36.9 C) (12/20 0700) Pulse Rate:  [75-120] 104 (12/20 0700) Resp:  [19-30] 25 (12/20 0700) BP: (87)/(56) 87/56 (12/20 0630) SpO2:  [70 %-100 %] 98 % (12/20 0700) Arterial Line BP: (74-112)/(55-85) 85/57 (12/20 0615) FiO2 (%):  [40 %] 40 % (12/20 0400) Weight:  [121.6 kg] 121.6 kg (12/20 0515) Last BM Date: 01/16/20  Weight change: Filed Weights   01/15/20 0500 01/16/20 0600 01/17/20 0515  Weight: 129.2 kg 126.9 kg 121.6 kg    Intake/Output:   Intake/Output Summary (Last 24 hours) at 01/17/2020 0737 Last data filed at 01/17/2020 0700 Gross per 24 hour  Intake 5049.91 ml  Output 10390 ml  Net -5340.09 ml      Physical Exam   PHYSICAL EXAM: CVP 14  General:  Critically ill male, intubated and sedated, open chest.   HEENT: normal + ETT Neck: supple. JVD 14cm. Carotids 2+ bilat; no bruits. No lymphadenopathy or thyromegaly appreciated. + rt axillary impella site stable  Cor: Open Chest, + CTs, Regular rhythm tachy rate. No rubs, gallops or murmurs. Lungs: intubated and course BS bilaterally  Abdomen: soft, nontender, nondistended. No hepatosplenomegaly. No bruits or masses. Good bowel sounds. Extremities: no cyanosis, clubbing, rash, 2+ bilateral LEE up to thighs + bilateral SCDs Neuro: intubated and sedated.  GU: + foley    Telemetry    Sinus tach w/ PACs  Personally reviewed   Labs    CBC Recent Labs    01/16/20 1634 01/16/20 1636 01/17/20 0314 01/17/20 0324  WBC 18.8*  --   --  19.1*  HGB 8.8*   < > 9.2* 8.9*  HCT 26.9*   < > 27.0* 27.6*  MCV 87.6  --   --  89.0  PLT 201  --   --  207   < > = values in this interval not displayed.   Basic Metabolic Panel Recent Labs    01/15/20 1733 01/15/20 1741 01/16/20 1634 01/16/20 1636 01/17/20 0314 01/17/20 0324 01/17/20 0326  NA 137   < > 137   < > 139  --  137  K 3.6   < > 3.2*   < > 3.8  --  3.7  CL 104   < > 104  --   --   --  101  CO2 24   < > 24  --   --   --  24  GLUCOSE 153*   < > 182*  --   --   --  160*  BUN 32*   < > 36*  --   --   --  33*  CREATININE 1.85*   < > 1.78*  --   --   --  1.74*  CALCIUM 8.1*   < > 7.9*  --   --   --  8.0*  MG 1.6*  --   --   --   --  2.1  --   PHOS 3.7  --  3.3  --   --   --  3.1   < > = values in this interval not displayed.   Liver Function Tests Recent Labs    01/16/20 0349 01/16/20 1634 01/17/20 0324 01/17/20 0326  AST 350*  --  172*  --   ALT 335*  --  262*  --   ALKPHOS 155*  --  168*  --   BILITOT 1.6*  --  1.3*  --   PROT 5.0*  --  5.5*  --   ALBUMIN 2.0*   < > 2.1* 2.0*   < > = values in this interval not displayed.   No results for input(s): LIPASE, AMYLASE in the last 72 hours. Cardiac Enzymes No results for input(s): CKTOTAL, CKMB, CKMBINDEX, TROPONINI in the last  72 hours.  BNP: BNP (last 3 results) Recent Labs    01/07/2020 1619  BNP 577.5*    ProBNP (last 3 results) No results for input(s): PROBNP in the last 8760 hours.   D-Dimer No results for input(s): DDIMER in the last 72 hours. Hemoglobin A1C No results for input(s): HGBA1C in the last 72 hours. Fasting Lipid Panel No results for input(s): CHOL, HDL, LDLCALC, TRIG, CHOLHDL, LDLDIRECT in the last 72 hours. Thyroid Function Tests No results for input(s): TSH, T4TOTAL, T3FREE, THYROIDAB in the last 72 hours.  Invalid input(s): FREET3  Other results:   Imaging    DG Chest Port 1 View  Result Date: 01/17/2020 CLINICAL DATA:  Intubation.  Chest tube.  Open sternum. EXAM: PORTABLE CHEST 1 VIEW COMPARISON:  Chest x-ray 01/16/2020. FINDINGS: Endotracheal tube, feeding tube, Swan-Ganz catheter, bilateral chest tubes, mediastinal drainage tube in stable position. Cardiac pacer and Impella device in stable position. Stable cardiomegaly. Diffuse bilateral pulmonary infiltrates/edema again noted with slight improvement. Persistent bibasilar atelectasis again noted. No prominent pleural effusion. No pneumothorax. Surgical staples noted over right shoulder. IMPRESSION: 1. Lines and tubes including bilateral chest tubes in stable position. No pneumothorax. 2. Cardiac pacer and Impella device in stable position. Stable cardiomegaly. 3. Diffuse bilateral pulmonary infiltrates/edema again noted with slight improvement. Persistent bibasilar atelectasis again noted. Electronically Signed   By: Marcello Moores  Register   On: 01/17/2020 06:27     Medications:     Scheduled Medications: . acetaminophen  1,000 mg Oral Q6H   Or  . acetaminophen (TYLENOL) oral liquid 160 mg/5 mL  1,000 mg Per Tube Q6H  . aspirin  300 mg Rectal Daily  . atorvastatin  80 mg Per Tube Daily  . bisacodyl  10 mg Oral Daily   Or  . bisacodyl  10 mg Rectal Daily  . busPIRone  10 mg Per Tube TID  . chlorhexidine gluconate  (MEDLINE KIT)  15 mL Mouth Rinse BID  . Chlorhexidine Gluconate Cloth  6 each Topical Daily  . docusate  100 mg Per Tube BID  . feeding supplement (PIVOT 1.5 CAL)  1,000 mL Per Tube Q24H  . fentaNYL (SUBLIMAZE) injection  25 mcg Intravenous Once  . FLUoxetine  40 mg Per Tube BID  . mouth rinse  15 mL Mouth Rinse 10 times per day  . metolazone  5 mg Per Tube BID  . pantoprazole sodium  40 mg Per Tube Daily  . polyethylene glycol  17 g Per Tube Daily  . sodium chloride flush  10-40 mL Intracatheter Q12H  . sodium chloride flush  3 mL Intravenous Q12H  . valproic acid  250 mg Per Tube BID    Infusions: .  prismasol BGK 4/2.5 500 mL/hr at 01/16/20 1136  .  prismasol BGK 4/2.5 200 mL/hr at 01/16/20 1135  . sodium chloride Stopped (01/04/2020 1646)  . sodium chloride 10 mL/hr at 01/14/20 2101  . sodium chloride Stopped (01/16/20 1032)  . amiodarone 30 mg/hr (01/17/20 0700)  . epinephrine 4 mcg/min (01/17/20 0700)  . fentaNYL infusion INTRAVENOUS 100 mcg/hr (01/17/20 0700)  . furosemide (LASIX) 200 mg in dextrose 5% 100 mL (29m/mL) infusion 20 mg/hr (01/17/20 0700)  . impella catheter heparin 50 unit/mL in dextrose 5%    . insulin 5.5 mL/hr at 01/17/20 0700  . lactated ringers 20 mL/hr at 01/17/20 0700  . meropenem (MERREM) IV Stopped (01/17/20 07412  . midazolam Stopped (01/15/20 1301)  . milrinone 0.125 mcg/kg/min (01/17/20 0700)  . norepinephrine (LEVOPHED) Adult infusion 17 mcg/min (01/17/20 0700)  . prismasol BGK 4/2.5 1,500 mL/hr at 01/17/20 0421  . vancomycin Stopped (01/16/20 1908)  . vasopressin 0.04 Units/min (01/17/20 0700)    PRN Medications: sodium chloride, sodium chloride, dextrose, fentaNYL, fentaNYL (SUBLIMAZE) injection, fentaNYL (SUBLIMAZE) injection, heparin, heparin, metoprolol tartrate, midazolam, midazolam, midazolam, ondansetron (ZOFRAN) IV, oxyCODONE, sodium chloride flush, sodium chloride flush     Assessment/Plan   1. Cardiogenic shock/acute on chronic  systolic CHF: Initially nonischemic cardiomyopathy. Possible familial cardiomyopathy as both parents had cardiomyopathy and died at around 89However, [employer] gene testing did not show any common mutation for cardiomyopathy.  However, this admission noted to have severe 3 vessel disease so suspect component of ischemic cardiomyopathy. [employer] ICD. Echo in 8/20 with EF 15% and mildly decreased RV function. Echo this admission with EF < 20%, moderate LV dilation, RV mildly reduced, severe LAE, no significant MR.Low output HF with markedly low EF.  He has a long history of cardiomyopathy (20 yrs), tends to minimize symptoms.  Cardiorenal syndrome with creatinine up to 2.2,  stabilized on milrinone and Impella 5.5. SCr 1.44 day of CABG. CABG 12/15.  Post-op shock, back to OR 12/16 to open chest (chest wall compartment syndrome) and again to evacuate hematoma.  TEE post-op with severe RV dysfunction.  CVVHD initiated 12/19 for volume removal in setting of rising Scr/decreased UOP. Suspect ATN  - Continue CVVHD for fluid removal, goal pull rate 200/hr. Will d/c MD discontinuation of lasix gtt  - Need to keep renal perfusion high. Continue Impella at P-8 - Increase NE as needed to keep MAPS > 70 consistently and closer to 80-90 - Impella waveforms and position ok. LDH 412 -> 468 -> 311->339.   - Continue EPI, NE, VP and Milrinone. Wean as tolerated  2. Atrial fibrillation: H/o PAF.  He was on dofetilide in the remote past but this was stopped due to noncompliance. He had  an upper GI bleed from antral ulcers in 3/12. He was seen by GI and was deemed safe to restart anticoagulation as long as he remains on a PPI.  No apparent recurrence of AF until just prior to this admission, was cardioverted back to NSR in ER but went back into atrial fibrillation.  Post-op afib, DCCV to NSR/BiV pacing am 12/16, now back in atrial fibrillation Remains in AF, - Continue amiodarone at 30 mg/hr. - Would not try to cardiovert  again until back on heparin gtt and vasoactive meds weaned.  - Off heparin post-op with bleeding, only getting heparin in Impella purge currently.  Restart systemic heparin gtt when surgery thinks safe to do so. No change - Unable to do Maze with CABG.   3. CAD:   Cath this admission with severe 3 vessel disease, ideally treated by CABG given extent of disease. CABG x 4 on 12/15.  - Continue atorvastatin, ASA.  - continue current management 4. Acute on chronic hypoxemic respiratory failure: Pulmonary edema, vent per CCM.  - Oxygenation worse in setting of worsening volume overload. Continue CVVHD. - CCM following 5. AKI on CKD stage ?3: Likely ATN/cardiorenal - SCr 1.74>>1.85>>2.02>>iniation of CVVH>>1.78>>1.74 - Needs fluid off to improve oxygenation and to permit chest closure.  - Continue CVVHD per Renal. Appreciate their help.  6. Diabetes: Insulin.  7. Hyponatremia: Resolved.  8. Torsades/ICD shock: 12/7/212 in hospital event, in setting of severe hypokalemia and hypomagnesemia.   9. Gout: h/o gout. Complained of rt knee pain c/w previous flares - treated w/ prednisone burst (completed).  10: ID: Tm 99.5 PCT 13.46 -> 8.65  Cultures NGTD.  - Covering with vancomycin/meropenem (PCN allergy).      Lyda Jester Se [facility]  01/17/2020 7:37 AM  Agee with above.   Remains intubated sedated. Chest open. On Impella at P-8 and multiple pressors. CVVHD started yesterday with excellent fluid removal. Down 11 pounds   General:  Sedated on vent HEENT: normal + ETT Neck: supple. RIJ swan Carotids 2+ bilat; no bruits. No lymphadenopathy or thryomegaly appreciated. Cor:  Chest open Irregular. CTs in place Lungs: coarse Abdomen: obese soft, nontender, nondistended.  Extremities: no cyanosis, clubbing, rash, 2-3+ edema. RFV vas cath Neuro: intubated sedated  Hemodynamics improved on Impella P-8 and multiple pressors though PAPi remains low. Getting good volume removal on CVVHD. Still almost  40 pounds up from pre-op weight. Main goal is to continue to remove volume to permit chest closure this week. Can stop lasix gtt as he is now oliguric. Impella waveforms and flows look good.   CRITICAL CARE Performed by: Glori Bickers  Total critical care time: 45 minutes  Critical care time was exclusive of separately billable procedures and treating other patients.  Critical care was necessary to treat or prevent imminent or life-threatening deterioration.  Critical care was time spent personally by me (independent of midlevel providers or residents) on the following activities: development of treatment plan with patient and/or surrogate as well as nursing, discussions with consultants, evaluation of patient's response to treatment, examination of patient, obtaining history from patient or surrogate, ordering and performing treatments and interventions, ordering and review of laboratory studies, ordering and review of radiographic studies, pulse oximetry and re-evaluation of patient's condition.  Glori Bickers, MD  9:37 AM

## 2020-01-17 NOTE — Progress Notes (Signed)
Patient ID: Corey Palmer, male   DOB: [birth year], 49 y.o.   MRN: [id number] S:Started on CRRT yesterday with marked UF of over 8 liters with some drop in UOP. O:BP 94/69   Pulse (!) 115   Temp 97.7 F (36.5 C)   Resp (!) 24   Ht '6\' 3"'  (1.905 m)   Wt 121.6 kg   SpO2 98%   BMI 33.51 kg/m   Intake/Output Summary (Last 24 hours) at 01/17/2020 0935 Last data filed at 01/17/2020 0900 Gross per 24 hour  Intake 5388.94 ml  Output 11146 ml  Net -5757.06 ml   Intake/Output: I/O last 3 completed shifts: In: 7459.9 [P.O.:15; I.V.:4064.3; Other:280.6; NG/GT:1950; IV Piggyback:1150] Out: 11900 [Urine:2758; IWPYK:9983; Chest Tube:840]  Intake/Output this shift:  Total I/O In: 354.9 [I.V.:218.8; Other:16.1; NG/GT:120] Out: 856 [Urine:25; Other:831] Weight change: -5.3 kg Gen: Intubated and sedated CVS: tachy at 115 Resp: occ rhonchi Abd: +BS, soft, NT/Nd Ext: 2+ anasarca  Recent Labs  Lab 01/04/2020 1022 12/31/2019 1026 01/14/20 1700 01/14/20 1722 01/15/20 0100 01/15/20 0519 01/15/20 0622 01/15/20 1733 01/15/20 1741 01/16/20 0349 01/16/20 0427 01/16/20 1028 01/16/20 1634 01/16/20 1636 01/17/20 0314 01/17/20 0324 01/17/20 0326  NA  --    < > 136   < > 136   < > 137 137   < > 138 140 141 137 139 139  --  137  K  --    < > 3.9   < > 3.5   < > 3.9 3.6   < > 3.6 3.5 3.3* 3.2* 3.3* 3.8  --  3.7  CL  --    < > 104  --  103  --  103 104  --  104  --   --  104  --   --   --  101  CO2  --    < > 23  --  22  --  24 24  --  24  --   --  24  --   --   --  24  GLUCOSE  --    < > 121*  --  142*  --  131* 153*  --  161*  --   --  182*  --   --   --  160*  BUN  --    < > 27*  --  30*  --  30* 32*  --  36*  --   --  36*  --   --   --  33*  CREATININE  --    < > 1.70*  --  1.65*  --  1.74* 1.85*  --  2.02*  --   --  1.78*  --   --   --  1.74*  ALBUMIN 2.4*  --  2.3*  --   --   --   --   --   --  2.0*  --   --  2.0*  --   --  2.1* 2.0*  CALCIUM  --    < > 8.2*  --  8.2*  --  8.2* 8.1*  --  8.0*   --   --  7.9*  --   --   --  8.0*  PHOS  --   --  4.3  --  4.1  --   --  3.7  --   --   --   --  3.3  --   --   --  3.1  AST  173*  --  264*  --   --   --   --   --   --  350*  --   --   --   --   --  172*  --   ALT 85*  --  171*  --   --   --   --   --   --  335*  --   --   --   --   --  262*  --    < > = values in this interval not displayed.   Liver Function Tests: Recent Labs  Lab 01/14/20 1700 01/16/20 0349 01/16/20 1634 01/17/20 0324 01/17/20 0326  AST 264* 350*  --  172*  --   ALT 171* 335*  --  262*  --   ALKPHOS 88 155*  --  168*  --   BILITOT 1.6* 1.6*  --  1.3*  --   PROT 4.7* 5.0*  --  5.5*  --   ALBUMIN 2.3* 2.0* 2.0* 2.1* 2.0*   No results for input(s): LIPASE, AMYLASE in the last 168 hours. No results for input(s): AMMONIA in the last 168 hours. CBC: Recent Labs  Lab 01/15/20 1733 01/15/20 1741 01/16/20 0349 01/16/20 0427 01/16/20 1634 01/16/20 1636 01/17/20 0314 01/17/20 0324 01/17/20 0806  WBC 17.7*  --  18.8*  --  18.8*  --   --  19.1* 19.8*  HGB 8.6*   < > 8.6*   < > 8.8*   < > 9.2* 8.9* 9.1*  HCT 26.4*   < > 26.2*   < > 26.9*   < > 27.0* 27.6* 28.6*  MCV 86.6  --  87.9  --  87.6  --   --  89.0 90.5  PLT 171  --  187  --  201  --   --  207 222   < > = values in this interval not displayed.   Cardiac Enzymes: No results for input(s): CKTOTAL, CKMB, CKMBINDEX, TROPONINI in the last 168 hours. CBG: Recent Labs  Lab 01/16/20 1027 01/16/20 1330 01/16/20 1425 01/16/20 1634 01/16/20 1818  GLUCAP 125* 170* 147* 167* 163*    Iron Studies: No results for input(s): IRON, TIBC, TRANSFERRIN, FERRITIN in the last 72 hours. Studies/Results: DG Chest Port 1 View  Result Date: 01/17/2020 CLINICAL DATA:  Intubation.  Chest tube.  Open sternum. EXAM: PORTABLE CHEST 1 VIEW COMPARISON:  Chest x-ray 01/16/2020. FINDINGS: Endotracheal tube, feeding tube, Swan-Ganz catheter, bilateral chest tubes, mediastinal drainage tube in stable position. Cardiac pacer and  Impella device in stable position. Stable cardiomegaly. Diffuse bilateral pulmonary infiltrates/edema again noted with slight improvement. Persistent bibasilar atelectasis again noted. No prominent pleural effusion. No pneumothorax. Surgical staples noted over right shoulder. IMPRESSION: 1. Lines and tubes including bilateral chest tubes in stable position. No pneumothorax. 2. Cardiac pacer and Impella device in stable position. Stable cardiomegaly. 3. Diffuse bilateral pulmonary infiltrates/edema again noted with slight improvement. Persistent bibasilar atelectasis again noted. Electronically Signed   By: Marcello Moores  Register   On: 01/17/2020 06:27   DG Chest Port 1 View  Result Date: 01/16/2020 CLINICAL DATA:  Chest tube in place. EXAM: PORTABLE CHEST 1 VIEW COMPARISON:  January 15, 2020 FINDINGS: The ETT is in good position. The feeding tube terminates below today's film. An acid the device remains. An Impella device is in stable position. Chest tubes are stable. No pneumothorax. Cardiomegaly. Opacity in left base may simply represent atelectasis. Stable mild congestive heart  failure. IMPRESSION: 1. Support apparatus as above. 2. Persistent cardiomegaly and mild congestive heart failure. 3. Left basilar opacity, likely atelectasis. Electronically Signed   By: Dorise Bullion III M.D   On: 01/16/2020 11:02   . acetaminophen  1,000 mg Oral Q6H   Or  . acetaminophen (TYLENOL) oral liquid 160 mg/5 mL  1,000 mg Per Tube Q6H  . aspirin  324 mg Per Tube Daily  . atorvastatin  80 mg Per Tube Daily  . bisacodyl  10 mg Oral Daily   Or  . bisacodyl  10 mg Rectal Daily  . busPIRone  10 mg Per Tube TID  . chlorhexidine gluconate (MEDLINE KIT)  15 mL Mouth Rinse BID  . Chlorhexidine Gluconate Cloth  6 each Topical Daily  . docusate  100 mg Per Tube BID  . feeding supplement (PIVOT 1.5 CAL)  1,000 mL Per Tube Q24H  . fentaNYL (SUBLIMAZE) injection  25 mcg Intravenous Once  . FLUoxetine  40 mg Per Tube BID  .  mouth rinse  15 mL Mouth Rinse 10 times per day  . metolazone  5 mg Per Tube BID  . pantoprazole sodium  40 mg Per Tube Daily  . polyethylene glycol  17 g Per Tube Daily  . sodium chloride flush  10-40 mL Intracatheter Q12H  . sodium chloride flush  3 mL Intravenous Q12H  . valproic acid  250 mg Per Tube BID    BMET    Component Value Date/Time   NA 137 01/17/2020 0326   NA 138 09/08/2018 0924   K 3.7 01/17/2020 0326   CL 101 01/17/2020 0326   CO2 24 01/17/2020 0326   GLUCOSE 160 (H) 01/17/2020 0326   BUN 33 (H) 01/17/2020 0326   BUN 23 09/08/2018 0924   CREATININE 1.74 (H) 01/17/2020 0326   CREATININE 1.13 05/13/2013 1533   CALCIUM 8.0 (L) 01/17/2020 0326   GFRNONAA 47 (L) 01/17/2020 0326   GFRNONAA 79 05/13/2013 1533   GFRAA >60 03/08/2019 1515   GFRAA >89 05/13/2013 1533   CBC    Component Value Date/Time   WBC 19.8 (H) 01/17/2020 0806   RBC 3.16 (L) 01/17/2020 0806   HGB 9.1 (L) 01/17/2020 0806   HGB 16.2 03/23/2018 1535   HCT 28.6 (L) 01/17/2020 0806   HCT 47.0 03/23/2018 1535   PLT 222 01/17/2020 0806   PLT 251 03/23/2018 1535   MCV 90.5 01/17/2020 0806   MCV 82 03/23/2018 1535   MCH 28.8 01/17/2020 0806   MCHC 31.8 01/17/2020 0806   RDW 18.2 (H) 01/17/2020 0806   RDW 15.0 03/23/2018 1535   LYMPHSABS 2.5 03/23/2018 1535   MONOABS 0.4 08/08/2017 0501   EOSABS 0.2 03/23/2018 1535   BASOSABS 0.0 03/23/2018 1535     Assessment/Plan:  1. AKI/CKD stage III- presumably due to ischemic ATN in setting of cardiogenic shock s/p CABG.  Initially with good response with lasix drip, however starting to have decrease in UOP following marked UF with CRRT overnight.   1. CRRT started 01/16/20 2. All fluids 4K/2.5Ca:  Pre-filter 500 ml/hr, post filter 200 ml/hr, dialysate 1500 ml/hr 3. UF goal 100-200 however nsg been pulling 300 ml/hr overnight (net UF of 8.3 liters) 4. No heparin due to hematoma 5. Stop lasix drip given oliguria and UF with CRRT 6. Do not exceed  100-200 ml/hr UF and keep MAP >65 for renoprotection 7. Right femoral HD catheter placed 01/16/20 2. CAD s/p CABG complicated by cardiogenic shock and post-op hematoma.  Currently with an open chest and awaiting improvement of volume status for secondary closure 3. Cardiogenic shock- underlying familial cardiomyopathy s/p redo CABG.  Remains on pressors:  Levophed, epinephrine, and vasopressin. Impella and milrinone. Tolerating UF with CRRT 4. ABLA- s/p postoperative bleeding into chest cavity s/p re-exploration for tamponade/hematoma.  CT surgery following.  5. Atrial fibrillation - s/p DCCV on 12/16 6. VDRF- per primary svc 7. ID - rising WBC.  Tm 99.5, on vancomycin and meropenem.  Donetta Potts, MD [employer] [phone]

## 2020-01-17 NOTE — Progress Notes (Signed)
4 Days Post-Op Procedure(s) (LRB): RE-EXPLORATION POST OPERATIVE OPEN HEART (N/A) TRANSESOPHAGEAL ECHOCARDIOGRAM (TEE) (N/A) Subjective:  Sedated on vent but has been following commands.  No problems overnight.  Objective: Vital signs in last 24 hours: Temp:  [96.6 F (35.9 C)-99.5 F (37.5 C)] 98.42 F (36.9 C) (12/20 0700) Pulse Rate:  [75-120] 104 (12/20 0700) Cardiac Rhythm: Sinus tachycardia (12/20 0400) Resp:  [19-30] 25 (12/20 0700) BP: (87)/(56) 87/56 (12/20 0630) SpO2:  [70 %-100 %] 98 % (12/20 0700) Arterial Line BP: (74-112)/(55-85) 85/57 (12/20 0615) FiO2 (%):  [40 %] 40 % (12/20 0400) Weight:  [121.6 kg] 121.6 kg (12/20 0515)  Hemodynamic parameters for last 24 hours: PAP: (30-46)/(13-32) 42/22 CVP:  [12 mmHg-25 mmHg] 14 mmHg CO:  [5.1 L/min-6.8 L/min] 6.3 L/min CI:  [2.2 L/min/m2-2.9 L/min/m2] 2.7 L/min/m2  Intake/Output from previous day: 12/19 0701 - 12/20 0700 In: 5049.9 [P.O.:15; I.V.:2652.2; DV/VO:1607.3; IV Piggyback:759.8] Out: 71062 [IRSWN:4627; Chest Tube:500] Intake/Output this shift: No intake/output data recorded.  General appearance: sedated on vent.  Neurologic: follows commands Heart: irregularly irregular rhythm, no murmur Lungs: clear to auscultation bilaterally Abdomen: soft, non-tender; bowel sounds normal Extremities: anasarca Wound: chest dressing intact over open chest. sternal separation stable.  Lab Results: Recent Labs    01/16/20 1634 01/16/20 1636 01/17/20 0314 01/17/20 0324  WBC 18.8*  --   --  19.1*  HGB 8.8*   < > 9.2* 8.9*  HCT 26.9*   < > 27.0* 27.6*  PLT 201  --   --  207   < > = values in this interval not displayed.   BMET:  Recent Labs    01/16/20 1634 01/16/20 1636 01/17/20 0314 01/17/20 0326  NA 137   < > 139 137  K 3.2*   < > 3.8 3.7  CL 104  --   --  101  CO2 24  --   --  24  GLUCOSE 182*  --   --  160*  BUN 36*  --   --  33*  CREATININE 1.78*  --   --  1.74*  CALCIUM 7.9*  --   --  8.0*   <  > = values in this interval not displayed.    PT/INR: No results for input(s): LABPROT, INR in the last 72 hours. ABG    Component Value Date/Time   PHART 7.347 (L) 01/17/2020 0314   HCO3 27.3 01/17/2020 0314   TCO2 29 01/17/2020 0314   ACIDBASEDEF 2.0 01/14/2020 1722   O2SAT 64.4 01/17/2020 0315   CBG (last 3)  Recent Labs    01/16/20 1425 01/16/20 1634 01/16/20 1818  GLUCAP 147* 167* 163*   CXR: diffuse edema or air space disease slightly improved.  Assessment/Plan: S/P Procedure(s) (LRB): RE-EXPLORATION POST OPERATIVE OPEN HEART (N/A) TRANSESOPHAGEAL ECHOCARDIOGRAM (TEE) (N/A)  POD 5 CABG POD 4 Re-exploration for hematoma/tamponade/chest compartment syndrome.  Hemodynamics have been fairly stable on current inotrope/pressors. CI 2.7 and Co-ox 64.4. Impella at P-8  Milrinone 0.125 Epi 4 NE 17 Vaso 0.04  Aim for MAP 70.  VDRF secondary to open chest and marked volume overload. Continue vent support per CCM.  Marked volume excess: removing 200+ cc per hr with CRRT. Still on lasix drip with oliguria at 15 cc/hr.  Atrial fib with controlled rate on IV amio. Would not anticoagulate with open chest due to high risk of bleeding. On heparin in Impella purge.  Goal at this time is to remove excess volume to allow resolution of pulmonary and  tissue edema to allow closure of chest.   LOS: 18 days    Gaye Pollack 01/17/2020

## 2020-01-17 NOTE — Progress Notes (Signed)
NAME:  Corey Palmer, MRN:  [id number], DOB:  12-15-1970, LOS: 98 ADMISSION DATE:  01/21/2020, CONSULTATION DATE: 01/14/2020 REFERRING MD: Aundra Dubin, CHIEF COMPLAINT: Respiratory failure post Impella  HPI/course in hospital  49 year old man underwent Impella 5.5 insertion 12/11 for persistent symptoms of forward failure with low cardiac indices.  He presented last week for ICD shocks and was found to be in rapid atrial fibrillation for which he was cardioverted.  He was also found to be in decompensated heart failure with an ejection fraction of 15% and multivessel coronary disease on left heart catheterization.  He was started on tropes and diuresed which improved his congestive symptoms but his index remained low so an Impella was placed to optimize his hemodynamics prior to a high risk redo-bypass.  Uneventful Impella implantation.  Patient returned from operating room intubated.  He had to go back to the OR for displacement of Impella 12/12.  Underwent CABGx4 on 12/15.  Returned to ICU on significant inotropic support and intubated.    Interim history/subjective:   12/20: remains on great amount of support via impella and gtt. Also on NO at this time 21ppm. Pt only on 152mcg of fentanyl will add some low dose for amnestic effect.     Objective   Blood pressure 94/69, pulse 95, temperature (!) 97.34 F (36.3 C), resp. rate (!) 24, height 6\' 3"  (1.905 m), weight 121.6 kg, SpO2 99 %. PAP: (32-46)/(13-32) 35/20 CVP:  [12 mmHg-25 mmHg] 12 mmHg CO:  [5.1 L/min-6.8 L/min] 5.5 L/min CI:  [2.2 L/min/m2-2.9 L/min/m2] 2.4 L/min/m2  Vent Mode: PRVC FiO2 (%):  [40 %] 40 % Set Rate:  [24 bmp] 24 bmp Vt Set:  [510 mL] 510 mL PEEP:  [5 cmH20] 5 cmH20 Plateau Pressure:  [24 cmH20-26 cmH20] 24 cmH20   Intake/Output Summary (Last 24 hours) at 01/17/2020 1018 Last data filed at 01/17/2020 1000 Gross per 24 hour  Intake 5550.74 ml  Output 11161 ml  Net -5610.26 ml   Filed Weights   01/15/20  0500 01/16/20 0600 01/17/20 0515  Weight: 129.2 kg 126.9 kg 121.6 kg   PAP: (32-46)/(13-32) 35/20 CVP:  [12 mmHg-25 mmHg] 12 mmHg CO:  [5.1 L/min-6.8 L/min] 5.5 L/min CI:  [2.2 L/min/m2-2.9 L/min/m2] 2.4 L/min/m2  Examination: Physical exam: Exam is unchanged General: Ill-appearing man, intubated, sedated HEENT: ET tube in good position, OG tube in position, no oral lesions Neuro: Sedated, does not wake to voice Chest: Median sternotomy open, regular, Impella sounds audible, chest tubes in place, clear Abdomen: Nondistended, hypoactive bowel sounds Skin: Cool/mottled extremities, no rash  Ancillary tests (personally reviewed)  CBC: Recent Labs  Lab 01/15/20 1733 01/15/20 1741 01/16/20 0349 01/16/20 0427 01/16/20 1634 01/16/20 1636 01/17/20 0314 01/17/20 0324 01/17/20 0806  WBC 17.7*  --  18.8*  --  18.8*  --   --  19.1* 19.8*  HGB 8.6*   < > 8.6*   < > 8.8* 8.8* 9.2* 8.9* 9.1*  HCT 26.4*   < > 26.2*   < > 26.9* 26.0* 27.0* 27.6* 28.6*  MCV 86.6  --  87.9  --  87.6  --   --  89.0 90.5  PLT 171  --  187  --  201  --   --  207 222   < > = values in this interval not displayed.    Basic Metabolic Panel: Recent Labs  Lab 01/11/2020 1655 01/13/20 1720 01/14/20 1700 01/14/20 1722 01/15/20 0100 01/15/20 0519 01/15/20 0622 01/15/20 1733 01/15/20 1741  01/16/20 0349 01/16/20 0427 01/16/20 1028 01/16/20 1634 01/16/20 1636 01/17/20 0314 01/17/20 0324 01/17/20 0326  NA 135   < > 136   < > 136   < > 137 137   < > 138   < > 141 137 139 139  --  137  K 4.2   < > 3.9   < > 3.5   < > 3.9 3.6   < > 3.6   < > 3.3* 3.2* 3.3* 3.8  --  3.7  CL 103   < > 104  --  103  --  103 104  --  104  --   --  104  --   --   --  101  CO2 22   < > 23  --  22  --  24 24  --  24  --   --  24  --   --   --  24  GLUCOSE 122*   < > 121*  --  142*  --  131* 153*  --  161*  --   --  182*  --   --   --  160*  BUN 26*   < > 27*  --  30*  --  30* 32*  --  36*  --   --  36*  --   --   --  33*   CREATININE 1.79*   < > 1.70*  --  1.65*  --  1.74* 1.85*  --  2.02*  --   --  1.78*  --   --   --  1.74*  CALCIUM 8.1*   < > 8.2*  --  8.2*  --  8.2* 8.1*  --  8.0*  --   --  7.9*  --   --   --  8.0*  MG 2.1  --  1.8  --  1.8  --   --  1.6*  --   --   --   --   --   --   --  2.1  --   PHOS  --   --  4.3  --  4.1  --   --  3.7  --   --   --   --  3.3  --   --   --  3.1   < > = values in this interval not displayed.   GFR: Estimated Creatinine Clearance: 70.5 mL/min (A) (by C-G formula based on SCr of 1.74 mg/dL (H)). Recent Labs  Lab 01/10/2020 1022 12/31/2019 1655 01/14/20 0406 01/14/20 1700 01/15/20 0100 01/15/20 0513 01/15/20 1733 01/16/20 0349 01/16/20 1634 01/17/20 0324 01/17/20 0325 01/17/20 0806  PROCALCITON 15.15  --  13.46  --  8.65  --   --   --   --   --   --   --   WBC 16.7*   < > 17.0*   < > 19.0*   < > 17.7* 18.8* 18.8* 19.1*  --  19.8*  LATICACIDVEN  --    < > 1.2   < >  --    < > 1.1 1.0 0.9  --  0.9  --    < > = values in this interval not displayed.    Liver Function Tests: Recent Labs  Lab 01/26/2020 1022 01/14/20 1700 01/16/20 0349 01/16/20 1634 01/17/20 0324 01/17/20 0326  AST 173* 264* 350*  --  172*  --   ALT 85* 171* 335*  --  262*  --   ALKPHOS 73 88 155*  --  168*  --   BILITOT 1.6* 1.6* 1.6*  --  1.3*  --   PROT 4.3* 4.7* 5.0*  --  5.5*  --   ALBUMIN 2.4* 2.3* 2.0* 2.0* 2.1* 2.0*   No results for input(s): LIPASE, AMYLASE in the last 168 hours. No results for input(s): AMMONIA in the last 168 hours.  ABG    Component Value Date/Time   PHART 7.347 (L) 01/17/2020 0314   PCO2ART 49.4 (H) 01/17/2020 0314   PO2ART 252 (H) 01/17/2020 0314   HCO3 27.3 01/17/2020 0314   TCO2 29 01/17/2020 0314   ACIDBASEDEF 2.0 01/14/2020 1722   O2SAT 53.8 01/17/2020 0834     Coagulation Profile: Recent Labs  Lab 01/07/2020 1518 01/12/2020 0340 01/03/2020 1041  INR 1.7* 1.9* 1.9*    Cardiac Enzymes: No results for input(s): CKTOTAL, CKMB, CKMBINDEX,  TROPONINI in the last 168 hours.  HbA1C: Hemoglobin A1C  Date/Time Value Ref Range Status  12/01/2019 04:04 PM 10.8 (A) 4.0 - 5.6 % Final  02/03/2019 04:24 PM 12.5 (A) 4.0 - 5.6 % Final   Hgb A1c MFr Bld  Date/Time Value Ref Range Status  01/03/2020 03:41 PM 10.6 (H) 4.8 - 5.6 % Final    Comment:    (NOTE) Pre diabetes:          5.7%-6.4%  Diabetes:              >6.4%  Glycemic control for   <7.0% adults with diabetes   10/01/2018 04:02 PM 11.9 (H) 4.8 - 5.6 % Final    Comment:    (NOTE) Pre diabetes:          5.7%-6.4% Diabetes:              >6.4% Glycemic control for   <7.0% adults with diabetes     CBG: Recent Labs  Lab 01/16/20 1027 01/16/20 1330 01/16/20 1425 01/16/20 1634 01/16/20 1818  GLUCAP 125* 170* 147* 167* 163*    Assessment & Plan:  Acute hypoxic respiratory failure -Continue with current related support, PRVC 6 cc/kg.  Not in a position or hemodynamically stable enough to consider spontaneous breathing trials -Continue daily wake-up assessments, continue current level of sedation -cxr with some improved aeration, personally reviewed by me Cardiogenic shock Acute on chronic systolic heart failure requiring mechanical cardiac support with Impala and titration of inotropes Severe coronary artery disease status post CABG, complicated by cardiac tamponade, post hematoma removal in OR x 2 -Continue pressors, inotropes as ordered, weaning as able -Continue nitric oxide 20 ppm for now, consider weaning when respiratory status stabilized -on impella at p8 Afib:  -amio gtt   Bipolar affective Diabetes type 2 with hyperglycemia -insulin infusion -a1c 10.6 Paroxysmal atrial fibrillation requiring amiodarone infusion.  Acute kidney injury-improving cardiorenal syndrome  Acute blood loss anemia -Volume removal still an issue.  Rising serum creatinine with addition of metolazone, increase Lasix.  Discussed with Dr. Haroldine Laws, planning now for initiation  CVVH for volume removal -On empiric antibiotics -Following BMP, electrolytes -Following CBC, goal hemoglobin 8.0 -on heparin gtt thru purge  Transaminitis:  -improving trend   Daily Goals Checklist  Pain/Anxiety/Delirium protocol (if indicated): versed and fentanyl infusions.  VAP protocol (if indicated): N/A DVT prophylaxis: SCD GI prophylaxis: Protonix Glucose control: insulin infusion Code Status: Full code Family Communication: Per primary team Disposition: ICU    Total critical care time: 33 minutes  Critical care time: The patient is  critically ill with multiple organ systems failure and requires high complexity decision making for assessment and support, frequent evaluation and titration of therapies, application of advanced monitoring technologies and extensive interpretation of multiple databases.  Critical care time 33 mins. This represents my time independent of the NPs time taking care of the pt. This is excluding procedures.    Audria Nine DO [facility] 01/17/2020, 10:19 AM

## 2020-01-17 NOTE — Progress Notes (Signed)
[place] for heparin Indication: Impella 5.5  Allergies  Allergen Reactions  . Orange Fruit Anaphylaxis, Hives and Other (See Comments)    Per Pt- Blisters around lips and Hives all over, also  . Penicillins Anaphylaxis    Did it involve swelling of the face/tongue/throat, SOB, or low BP? Yes Did it involve sudden or severe rash/hives, skin peeling, or any reaction on the inside of your mouth or nose? Yes Did you need to seek medical attention at a hospital or doctor's office? No When did it last happen?childhood If all above answers are "NO", may proceed with cephalosporin use.  [name] [Empagliflozin] Itching  . [name] [Insulin Glargine] Nausea And Vomiting    Patient Measurements: Height: 6\' 3"  (190.5 cm) Weight: 121.6 kg (268 lb 1.3 oz) IBW/kg (Calculated) : 84.5 Heparin dosing wt: 101 kg  Vital Signs: Temp: 97.7 F (36.5 C) (12/20 1500) Temp Source: Core (12/20 1200) BP: 82/58 (12/20 1120) Pulse Rate: 95 (12/20 1500)  Labs: Recent Labs    01/15/20 0513 01/15/20 0519 01/16/20 0349 01/16/20 0427 01/16/20 1634 01/16/20 1636 01/17/20 0314 01/17/20 0324 01/17/20 0325 01/17/20 0326 01/17/20 0806  HGB  --    < > 8.6*   < > 8.8*   < > 9.2* 8.9*  --   --  9.1*  HCT  --    < > 26.2*   < > 26.9*   < > 27.0* 27.6*  --   --  28.6*  PLT  --    < > 187  --  201  --   --  207  --   --  222  HEPARINUNFRC <0.10*  --  <0.10*  --   --   --   --   --  <0.10*  --   --   CREATININE  --    < > 2.02*  --  1.78*  --   --   --   --  1.74*  --    < > = values in this interval not displayed.    Estimated Creatinine Clearance: 70.5 mL/min (A) (by C-G formula based on SCr of 1.74 mg/dL (H)).   Assessment: 49 yo male s/p impella 5.5 placement. Pt started on heparinized purge and systemic heparin. Pt now s/p CABG 12/15 with Impella remaining in place c/b significant bleeding and hematoma requiring opened chest and reexploration in  OR 12/16.  Impella on P8 with stable pressures. Heparin running via purge only at this time, purge flow rate 8.2 ml/hr (approx 410 units/hr heparin). Heparin level undetectable as expected, CBC stable this AM. No overt bleeding per discussion with nursing.  Goal of Therapy:  Heparin level 0.2-0.5 units/ml Monitor platelets by anticoagulation protocol: Yes   Plan:  Continue heparin via the purge. Will follow with CVTS regarding systemic heparin once appropriate to initiate Daily heparin level for now  [name], [name] Reason, [facility] Clinical Pharmacist  01/17/2020 3:17 PM   [facility] pharmacy phone numbers are listed on [email or web address]  Please check [email or web address] for unit-specific pharmacy phone numbers.

## 2020-01-18 ENCOUNTER — Inpatient Hospital Stay (HOSPITAL_COMMUNITY): Payer: BC Managed Care – PPO

## 2020-01-18 DIAGNOSIS — Z95811 Presence of heart assist device: Secondary | ICD-10-CM

## 2020-01-18 DIAGNOSIS — I5022 Chronic systolic (congestive) heart failure: Secondary | ICD-10-CM

## 2020-01-18 LAB — RENAL FUNCTION PANEL
Albumin: 2.3 g/dL — ABNORMAL LOW (ref 3.5–5.0)
Anion gap: 10 (ref 5–15)
BUN: 27 mg/dL — ABNORMAL HIGH (ref 6–20)
CO2: 24 mmol/L (ref 22–32)
Calcium: 8.2 mg/dL — ABNORMAL LOW (ref 8.9–10.3)
Chloride: 102 mmol/L (ref 98–111)
Creatinine, Ser: 1.73 mg/dL — ABNORMAL HIGH (ref 0.61–1.24)
GFR, Estimated: 47 mL/min — ABNORMAL LOW (ref >60)
Glucose, Bld: 123 mg/dL — ABNORMAL HIGH (ref 70–99)
Phosphorus: 2.9 mg/dL (ref 2.5–4.6)
Potassium: 4.5 mmol/L (ref 3.5–5.1)
Sodium: 136 mmol/L (ref 135–145)

## 2020-01-18 LAB — CBC WITH DIFFERENTIAL/PLATELET
Abs Immature Granulocytes: 0.93 10*3/uL — ABNORMAL HIGH (ref 0.00–0.07)
Basophils Absolute: 0.1 10*3/uL (ref 0.0–0.1)
Basophils Relative: 1 %
Eosinophils Absolute: 0.6 10*3/uL — ABNORMAL HIGH (ref 0.0–0.5)
Eosinophils Relative: 3 %
HCT: 28.8 % — ABNORMAL LOW (ref 39.0–52.0)
Hemoglobin: 9 g/dL — ABNORMAL LOW (ref 13.0–17.0)
Immature Granulocytes: 5 %
Lymphocytes Relative: 5 %
Lymphs Abs: 1 10*3/uL (ref 0.7–4.0)
MCH: 28.8 pg (ref 26.0–34.0)
MCHC: 31.3 g/dL (ref 30.0–36.0)
MCV: 92 fL (ref 80.0–100.0)
Monocytes Absolute: 1.9 10*3/uL — ABNORMAL HIGH (ref 0.1–1.0)
Monocytes Relative: 9 %
Neutro Abs: 15.3 10*3/uL — ABNORMAL HIGH (ref 1.7–7.7)
Neutrophils Relative %: 77 %
Platelets: 231 10*3/uL (ref 150–400)
RBC: 3.13 MIL/uL — ABNORMAL LOW (ref 4.22–5.81)
RDW: 18.9 % — ABNORMAL HIGH (ref 11.5–15.5)
WBC: 19.8 10*3/uL — ABNORMAL HIGH (ref 4.0–10.5)
nRBC: 1.8 % — ABNORMAL HIGH (ref 0.0–0.2)

## 2020-01-18 LAB — CULTURE, BLOOD (ROUTINE X 2)
Culture: NO GROWTH
Culture: NO GROWTH
Special Requests: ADEQUATE

## 2020-01-18 LAB — GLUCOSE, CAPILLARY
Glucose-Capillary: 156 mg/dL — ABNORMAL HIGH (ref 70–99)
Glucose-Capillary: 102 mg/dL — ABNORMAL HIGH (ref 70–99)
Glucose-Capillary: 119 mg/dL — ABNORMAL HIGH (ref 70–99)
Glucose-Capillary: 123 mg/dL — ABNORMAL HIGH (ref 70–99)
Glucose-Capillary: 123 mg/dL — ABNORMAL HIGH (ref 70–99)
Glucose-Capillary: 141 mg/dL — ABNORMAL HIGH (ref 70–99)
Glucose-Capillary: 122 mg/dL — ABNORMAL HIGH (ref 70–99)
Glucose-Capillary: 171 mg/dL — ABNORMAL HIGH (ref 70–99)
Glucose-Capillary: 194 mg/dL — ABNORMAL HIGH (ref 70–99)
Glucose-Capillary: 214 mg/dL — ABNORMAL HIGH (ref 70–99)
Glucose-Capillary: 175 mg/dL — ABNORMAL HIGH (ref 70–99)
Glucose-Capillary: 178 mg/dL — ABNORMAL HIGH (ref 70–99)
Glucose-Capillary: 159 mg/dL — ABNORMAL HIGH (ref 70–99)
Glucose-Capillary: 125 mg/dL — ABNORMAL HIGH (ref 70–99)
Glucose-Capillary: 161 mg/dL — ABNORMAL HIGH (ref 70–99)
Glucose-Capillary: 119 mg/dL — ABNORMAL HIGH (ref 70–99)
Glucose-Capillary: 133 mg/dL — ABNORMAL HIGH (ref 70–99)
Glucose-Capillary: 153 mg/dL — ABNORMAL HIGH (ref 70–99)
Glucose-Capillary: 132 mg/dL — ABNORMAL HIGH (ref 70–99)
Glucose-Capillary: 144 mg/dL — ABNORMAL HIGH (ref 70–99)

## 2020-01-18 LAB — COOXEMETRY PANEL
Carboxyhemoglobin: 1.4 % (ref 0.5–1.5)
Methemoglobin: 0.8 % (ref 0.0–1.5)
O2 Saturation: 53.7 %
Total hemoglobin: 14.8 g/dL (ref 12.0–16.0)

## 2020-01-18 LAB — POCT I-STAT 7, (LYTES, BLD GAS, ICA,H+H)
Acid-Base Excess: 0 mmol/L (ref 0.0–2.0)
Bicarbonate: 26.5 mmol/L (ref 20.0–28.0)
Calcium, Ion: 1.18 mmol/L (ref 1.15–1.40)
HCT: 29 % — ABNORMAL LOW (ref 39.0–52.0)
Hemoglobin: 9.9 g/dL — ABNORMAL LOW (ref 13.0–17.0)
O2 Saturation: 98 %
Patient temperature: 36.2
Potassium: 3.9 mmol/L (ref 3.5–5.1)
Sodium: 138 mmol/L (ref 135–145)
TCO2: 28 mmol/L (ref 22–32)
pCO2 arterial: 50.6 mmHg — ABNORMAL HIGH (ref 32.0–48.0)
pH, Arterial: 7.324 — ABNORMAL LOW (ref 7.350–7.450)
pO2, Arterial: 103 mmHg (ref 83.0–108.0)

## 2020-01-18 LAB — COMPREHENSIVE METABOLIC PANEL
ALT: 183 U/L — ABNORMAL HIGH (ref 0–44)
AST: 86 U/L — ABNORMAL HIGH (ref 15–41)
Albumin: 2.2 g/dL — ABNORMAL LOW (ref 3.5–5.0)
Alkaline Phosphatase: 177 U/L — ABNORMAL HIGH (ref 38–126)
Anion gap: 8 (ref 5–15)
BUN: 28 mg/dL — ABNORMAL HIGH (ref 6–20)
CO2: 24 mmol/L (ref 22–32)
Calcium: 8.2 mg/dL — ABNORMAL LOW (ref 8.9–10.3)
Chloride: 104 mmol/L (ref 98–111)
Creatinine, Ser: 1.75 mg/dL — ABNORMAL HIGH (ref 0.61–1.24)
GFR, Estimated: 47 mL/min — ABNORMAL LOW (ref >60)
Glucose, Bld: 158 mg/dL — ABNORMAL HIGH (ref 70–99)
Potassium: 3.9 mmol/L (ref 3.5–5.1)
Sodium: 136 mmol/L (ref 135–145)
Total Bilirubin: 1.4 mg/dL — ABNORMAL HIGH (ref 0.3–1.2)
Total Protein: 6 g/dL — ABNORMAL LOW (ref 6.5–8.1)

## 2020-01-18 LAB — HEPARIN LEVEL (UNFRACTIONATED): Heparin Unfractionated: <0.1 IU/mL — ABNORMAL LOW (ref 0.30–0.70)

## 2020-01-18 LAB — LACTIC ACID, PLASMA
Lactic Acid, Venous: 0.7 mmol/L (ref 0.5–1.9)
Lactic Acid, Venous: 0.7 mmol/L (ref 0.5–1.9)

## 2020-01-18 LAB — MAGNESIUM: Magnesium: 2.3 mg/dL (ref 1.7–2.4)

## 2020-01-18 LAB — VANCOMYCIN, TROUGH: Vancomycin Tr: 27 ug/mL (ref 15–20)

## 2020-01-18 LAB — ECHOCARDIOGRAM LIMITED
Height: 75 in
Weight: 4127.01 oz

## 2020-01-18 LAB — PHOSPHORUS: Phosphorus: 2.8 mg/dL (ref 2.5–4.6)

## 2020-01-18 LAB — LACTATE DEHYDROGENASE: LDH: 408 U/L — ABNORMAL HIGH (ref 98–192)

## 2020-01-18 MED ORDER — B COMPLEX-C PO TABS
1.0000 | ORAL_TABLET | Freq: Every day | ORAL | Status: DC
  Administered 2020-01-18 – 2020-01-27 (×10): 1
  Filled 2020-01-18 (×10): qty 1

## 2020-01-18 MED ORDER — POTASSIUM CHLORIDE 10 MEQ/50ML IV SOLN
10.0000 meq | INTRAVENOUS | Status: AC
Start: 2020-01-18 — End: 2020-01-18
  Administered 2020-01-18 (×2): 10 meq via INTRAVENOUS
  Filled 2020-01-18 (×2): qty 50

## 2020-01-18 MED ORDER — SODIUM BICARBONATE 650 MG PO TABS
650.0000 mg | ORAL_TABLET | Freq: Once | ORAL | Status: DC
  Filled 2020-01-18: qty 1

## 2020-01-18 MED ORDER — PANCRELIPASE (LIP-PROT-AMYL) 10440-39150 UNITS PO TABS
20880.0000 [IU] | ORAL_TABLET | Freq: Once | ORAL | Status: DC
  Filled 2020-01-18: qty 2

## 2020-01-18 MED ORDER — AMIODARONE LOAD VIA INFUSION
150.0000 mg | Freq: Once | INTRAVENOUS | Status: AC
  Administered 2020-01-18: 09:00:00 150 mg via INTRAVENOUS
  Filled 2020-01-18: qty 83.34

## 2020-01-18 MED ORDER — VANCOMYCIN HCL 1250 MG/250ML IV SOLN
1250.0000 mg | INTRAVENOUS | Status: DC
  Administered 2020-01-19: 17:00:00 1250 mg via INTRAVENOUS
  Filled 2020-01-18 (×2): qty 250

## 2020-01-18 MED ORDER — POTASSIUM PHOSPHATES 15 MMOLE/5ML IV SOLN
10.0000 mmol | Freq: Once | INTRAVENOUS | Status: AC
  Administered 2020-01-18: 14:00:00 10 mmol via INTRAVENOUS
  Filled 2020-01-18: qty 3.33

## 2020-01-18 NOTE — Progress Notes (Signed)
EVENING ROUNDS NOTE :     [address]       [place] [address]             [phone]                 5 Days Post-Op Procedure(s) (LRB): RE-EXPLORATION POST OPERATIVE OPEN HEART (N/A) TRANSESOPHAGEAL ECHOCARDIOGRAM (TEE) (N/A)  Total Length of Stay:  LOS: 19 days  BP (!) 67/55   Pulse (!) 113   Temp 98.06 F (36.7 C)   Resp (!) 25   Ht 6\' 3"  (1.905 m)   Wt 117 kg   SpO2 99%   BMI 32.24 kg/m   .Intake/Output      12/20 0701 12/21 0700 12/21 0701 12/22 0700   P.O.     I.V. (mL/kg) 2645.8 (22.6) 981.4 (8.4)   Other 195.3 69.1   NG/GT 1380 360   IV Piggyback 699.9 247.2   Total Intake(mL/kg) 4921 (42.1) 1657.7 (14.2)   Urine (mL/kg/hr) 306 (0.1) 20 (0)   Other 9844 3327   Stool     Chest Tube 200 20   Total Output 10350 3367   Net -5429 -1709.3          .  prismasol BGK 4/2.5 500 mL/hr at 01/18/20 0815  .  prismasol BGK 4/2.5 200 mL/hr at 01/18/20 0815  . sodium chloride Stopped (01/25/2020 1646)  . sodium chloride 10 mL/hr at 01/14/20 2101  . sodium chloride Stopped (01/16/20 1032)  . amiodarone 30 mg/hr (01/18/20 1500)  . epinephrine 4 mcg/min (01/18/20 1500)  . fentaNYL infusion INTRAVENOUS 150 mcg/hr (01/18/20 1500)  . impella catheter heparin 50 unit/mL in dextrose 5%    . insulin 2.8 mL/hr at 01/18/20 1500  . lactated ringers 20 mL/hr at 01/18/20 1500  . meropenem (MERREM) IV Stopped (01/18/20 1343)  . midazolam 4 mg/hr (01/18/20 1500)  . milrinone 0.125 mcg/kg/min (01/18/20 1500)  . norepinephrine (LEVOPHED) Adult infusion 22 mcg/min (01/18/20 1500)  . potassium PHOSPHATE IVPB (in mmol) 42 mL/hr at 01/18/20 1500  . prismasol BGK 4/2.5 1,500 mL/hr at 01/18/20 1153  . vancomycin Stopped (01/17/20 1801)  . vasopressin 0.04 Units/min (01/18/20 1500)     Lab Results  Component Value Date   WBC 19.8 (H) 01/18/2020   HGB 9.0 (L) 01/18/2020   HCT 28.8 (L) 01/18/2020   PLT 231 01/18/2020   GLUCOSE 158 (H) 01/18/2020   CHOL 176  10/01/2018   TRIG 126 10/01/2018   HDL 25 (L) 10/01/2018   LDLDIRECT 116.5 11/01/2011   LDLCALC 126 (H) 10/01/2018   ALT 183 (H) 01/18/2020   AST 86 (H) 01/18/2020   NA 136 01/18/2020   K 3.9 01/18/2020   CL 104 01/18/2020   CREATININE 1.75 (H) 01/18/2020   BUN 28 (H) 01/18/2020   CO2 24 01/18/2020   TSH 3.009 10/01/2018   INR 1.9 (H) 01/25/2020   HGBA1C 10.6 (H) 01/03/2020   impulls , adjusted today- pulled back On p8 Decreased BP today  On cvvh Maybe intravascular depleted currently cvp 7-8    Grace Isaac MD  Beeper [phone] Office [phone] 01/18/2020 3:47 PM

## 2020-01-18 NOTE — Progress Notes (Signed)
CRITICAL VALUE ALERT  Critical Value:  Vancomycin   Date & Time Notied:  01/18/2020 1616   Provider Notified: Floor PharmD  Orders Received/Actions taken: see new orders for tomorrow  Kathleene Hazel RN

## 2020-01-18 NOTE — Progress Notes (Signed)
NAME:  Corey Palmer, MRN:  [id number], DOB:  1970/03/15, LOS: 58 ADMISSION DATE:  01/04/2020, CONSULTATION DATE: 01/07/2020 REFERRING MD: Aundra Dubin, CHIEF COMPLAINT: Respiratory failure post Impella  HPI/course in hospital  49 year old man underwent Impella 5.5 insertion 12/11 for persistent symptoms of forward failure with low cardiac indices.  He presented last week for ICD shocks and was found to be in rapid atrial fibrillation for which he was cardioverted.  He was also found to be in decompensated heart failure with an ejection fraction of 15% and multivessel coronary disease on left heart catheterization.  He was started on tropes and diuresed which improved his congestive symptoms but his index remained low so an Impella was placed to optimize his hemodynamics prior to a high risk redo-bypass.  Uneventful Impella implantation.  Patient returned from operating room intubated.  He had to go back to the OR for displacement of Impella 12/12.  Underwent CABGx4 on 12/15.  Returned to ICU on significant inotropic support and intubated.    Interim history/subjective:  12/21: started on versed gtt at this time. Off lasix infusion.  12/20: remains on great amount of support via impella and gtt. Also on NO at this time 21ppm. Pt only on 155mcg of fentanyl will add some low dose for amnestic effect.     Objective   Blood pressure 93/67, pulse (!) 115, temperature (!) 97.52 F (36.4 C), temperature source Rectal, resp. rate (!) 25, height 6\' 3"  (1.905 m), weight 117 kg, SpO2 99 %. PAP: (31-46)/(19-28) 46/24 CVP:  [11 mmHg-21 mmHg] 16 mmHg CO:  [5.6 L/min-6 L/min] 5.6 L/min CI:  [2.4 L/min/m2-2.6 L/min/m2] 2.4 L/min/m2  Vent Mode: PRVC FiO2 (%):  [40 %] 40 % Set Rate:  [24 bmp] 24 bmp Vt Set:  [510 mL] 510 mL PEEP:  [5 cmH20] 5 cmH20 Pressure Support:  [5 cmH20] 5 cmH20 Plateau Pressure:  [24 cmH20-26 cmH20] 24 cmH20   Intake/Output Summary (Last 24 hours) at 01/18/2020 0831 Last data  filed at 01/18/2020 0800 Gross per 24 hour  Intake 4944.57 ml  Output 10395 ml  Net -5450.43 ml   Filed Weights   01/16/20 0600 01/17/20 0515 01/18/20 0500  Weight: 126.9 kg 121.6 kg 117 kg   PAP: (31-46)/(19-28) 46/24 CVP:  [11 mmHg-21 mmHg] 16 mmHg CO:  [5.6 L/min-6 L/min] 5.6 L/min CI:  [2.4 L/min/m2-2.6 L/min/m2] 2.4 L/min/m2  Examination: Physical exam: Exam is unchanged General: Ill-appearing man, intubated, sedated HEENT: ET tube in good position, OG tube in position, no oral lesions Neuro: Sedated, does not wake to voice this am Chest: Median sternotomy open, regular, Impella sounds audible, chest tubes in place, clear Abdomen: Nondistended, hypoactive bowel sounds Skin: Cool extremities, no rash  Ancillary tests (personally reviewed)  CBC: Recent Labs  Lab 01/16/20 1634 01/16/20 1636 01/17/20 0324 01/17/20 0806 01/17/20 1525 01/17/20 1532 01/18/20 0359 01/18/20 0430  WBC 18.8*  --  19.1* 19.8* 16.8*  --   --  19.8*  NEUTROABS  --   --   --   --   --   --   --  15.3*  HGB 8.8*   < > 8.9* 9.1* 8.7* 8.8* 9.9* 9.0*  HCT 26.9*   < > 27.6* 28.6* 27.2* 26.0* 29.0* 28.8*  MCV 87.6  --  89.0 90.5 90.7  --   --  92.0  PLT 201  --  207 222 207  --   --  231   < > = values in this interval not  displayed.    Basic Metabolic Panel: Recent Labs  Lab 01/14/20 1700 01/14/20 1722 01/15/20 0100 01/15/20 0519 01/15/20 1733 01/15/20 1741 01/16/20 0349 01/16/20 0427 01/16/20 1634 01/16/20 1636 01/17/20 0324 01/17/20 0326 01/17/20 1525 01/17/20 1532 01/18/20 0359 01/18/20 0430  NA 136   < > 136   < > 137   < > 138   < > 137   < >  --  137 136 139 138 136  K 3.9   < > 3.5   < > 3.6   < > 3.6   < > 3.2*   < >  --  3.7 3.8 3.9 3.9 3.9  CL 104  --  103   < > 104  --  104  --  104  --   --  101 102  --   --  104  CO2 23  --  22   < > 24  --  24  --  24  --   --  24 25  --   --  24  GLUCOSE 121*  --  142*   < > 153*  --  161*  --  182*  --   --  160* 149*  --   --   158*  BUN 27*  --  30*   < > 32*  --  36*  --  36*  --   --  33* 30*  --   --  28*  CREATININE 1.70*  --  1.65*   < > 1.85*  --  2.02*  --  1.78*  --   --  1.74* 1.66*  --   --  1.75*  CALCIUM 8.2*  --  8.2*   < > 8.1*  --  8.0*  --  7.9*  --   --  8.0* 7.8*  --   --  8.2*  MG 1.8  --  1.8  --  1.6*  --   --   --   --   --  2.1  --   --   --   --  2.3  PHOS 4.3  --  4.1  --  3.7  --   --   --  3.3  --   --  3.1 2.7  --   --  2.8   < > = values in this interval not displayed.   GFR: Estimated Creatinine Clearance: 68.9 mL/min (A) (by C-G formula based on SCr of 1.75 mg/dL (H)). Recent Labs  Lab 01/22/2020 1022 01/15/2020 1655 01/14/20 0406 01/14/20 1700 01/15/20 0100 01/15/20 0513 01/16/20 1634 01/17/20 0324 01/17/20 0325 01/17/20 0806 01/17/20 1525 01/18/20 0355 01/18/20 0430  PROCALCITON 15.15  --  13.46  --  8.65  --   --   --   --   --   --   --   --   WBC 16.7*   < > 17.0*   < > 19.0*   < > 18.8* 19.1*  --  19.8* 16.8*  --  19.8*  LATICACIDVEN  --    < > 1.2   < >  --    < > 0.9  --  0.9  --  0.7 0.7  --    < > = values in this interval not displayed.    Liver Function Tests: Recent Labs  Lab 01/16/2020 1022 01/14/20 1700 01/16/20 0349 01/16/20 1634 01/17/20 0324 01/17/20 0326 01/17/20 1525 01/18/20 0430  AST 173* 264* 350*  --  172*  --   --  86*  ALT 85* 171* 335*  --  262*  --   --  183*  ALKPHOS 73 88 155*  --  168*  --   --  177*  BILITOT 1.6* 1.6* 1.6*  --  1.3*  --   --  1.4*  PROT 4.3* 4.7* 5.0*  --  5.5*  --   --  6.0*  ALBUMIN 2.4* 2.3* 2.0* 2.0* 2.1* 2.0* 2.1* 2.2*   No results for input(s): LIPASE, AMYLASE in the last 168 hours. No results for input(s): AMMONIA in the last 168 hours.  ABG    Component Value Date/Time   PHART 7.324 (L) 01/18/2020 0359   PCO2ART 50.6 (H) 01/18/2020 0359   PO2ART 103 01/18/2020 0359   HCO3 26.5 01/18/2020 0359   TCO2 28 01/18/2020 0359   ACIDBASEDEF 2.0 01/14/2020 1722   O2SAT 53.7 01/18/2020 0450     Coagulation  Profile: Recent Labs  Lab 01/18/2020 1518 01/15/2020 0340 12/30/2019 1041  INR 1.7* 1.9* 1.9*    Cardiac Enzymes: No results for input(s): CKTOTAL, CKMB, CKMBINDEX, TROPONINI in the last 168 hours.  HbA1C: Hemoglobin A1C  Date/Time Value Ref Range Status  12/01/2019 04:04 PM 10.8 (A) 4.0 - 5.6 % Final  02/03/2019 04:24 PM 12.5 (A) 4.0 - 5.6 % Final   Hgb A1c MFr Bld  Date/Time Value Ref Range Status  01/03/2020 03:41 PM 10.6 (H) 4.8 - 5.6 % Final    Comment:    (NOTE) Pre diabetes:          5.7%-6.4%  Diabetes:              >6.4%  Glycemic control for   <7.0% adults with diabetes   10/01/2018 04:02 PM 11.9 (H) 4.8 - 5.6 % Final    Comment:    (NOTE) Pre diabetes:          5.7%-6.4% Diabetes:              >6.4% Glycemic control for   <7.0% adults with diabetes     CBG: Recent Labs  Lab 01/18/20 0301 01/18/20 0359 01/18/20 0508 01/18/20 0605 01/18/20 0653  GLUCAP 119* 141* 123* 171* 122*    Assessment & Plan:  Acute hypoxic respiratory failure -Continue with current related support, PRVC 6 cc/kg.  Not in a position or hemodynamically stable enough to consider spontaneous breathing trials -Continue daily wake-up assessments, continue current level of sedation but added low dose versed for amnestic effects -cxr stable infiltrate/edema bilaterally, personally reviewed by me Cardiogenic shock Acute on chronic systolic heart failure requiring mechanical cardiac support with Impala and titration of inotropes Severe coronary artery disease status post CABG, complicated by cardiac tamponade, post hematoma removal in OR x 2 -Continue pressors, inotropes as ordered, weaning as able -Continue nitric oxide 20 ppm for now, consider weaning per CTS. -on impella at p8 -off lasix infusion, remains on crrt with UF net -200 Afib:  -amio gtt   Bipolar affective Diabetes type 2 with hyperglycemia -insulin infusion -a1c 10.6 Paroxysmal atrial fibrillation requiring amiodarone  infusion.  Acute kidney injury-improving cardiorenal syndrome  Acute blood loss anemia -Volume removal still an issue.  Remains on crrt -On empiric antibiotics -Following BMP, electrolytes -Following CBC, goal hemoglobin 8.0 -on heparin gtt thru purge  Transaminitis:  -improving trend   Daily Goals Checklist  Pain/Anxiety/Delirium protocol (if indicated): versed and fentanyl infusions.  VAP protocol (if indicated): per protocol DVT prophylaxis: SCD GI prophylaxis: Protonix Glucose  control: insulin infusion Code Status: Full code Family Communication: Per primary team but able to update wife at bedside re: vent Disposition: ICU    Total critical care time: 38 minutes  Critical care time: The patient is critically ill with multiple organ systems failure and requires high complexity decision making for assessment and support, frequent evaluation and titration of therapies, application of advanced monitoring technologies and extensive interpretation of multiple databases.  Critical care time 38 mins. This represents my time independent of the NPs time taking care of the pt. This is excluding procedures.    [facility] 01/18/2020, 8:31 AM

## 2020-01-18 NOTE — Progress Notes (Signed)
Pharmacy Antibiotic Note  Corey Palmer is a 49 y.o. male admitted on 01/09/2020  s/p CABG with Impella placement, remains with open chest with concerns for possible sepsis. Pharmacy has been consulted for vancomycin and meropenem dosing.  CRRT for volume removal in order to optimize for chest closure. Vanc trough came back at 27. Will adjust doses accordingly.  Plan: Adjust vancomycin to 1250 mg IV Q24 hrs Continue meropenem 1 gm IV q8 hrs Monitor renal function, cultures, clinical progression Check vancomycin levels as indicated.  Height: 6\' 3"  (190.5 cm) Weight: 117 kg (257 lb 15 oz) IBW/kg (Calculated) : 84.5  Temp (24hrs), Avg:98.1 F (36.7 C), Min:97.16 F (36.2 C), Max:99.1 F (37.3 C)  Recent Labs  Lab 01/15/20 0935 01/15/20 1733 01/16/20 1634 01/17/20 0324 01/17/20 0325 01/17/20 0326 01/17/20 0806 01/17/20 1525 01/18/20 0355 01/18/20 0430 01/18/20 1512 01/18/20 1517  WBC  --    < > 18.8* 19.1*  --   --  19.8* 16.8*  --  19.8*  --   --   CREATININE  --    < > 1.78*  --   --  1.74*  --  1.66*  --  1.75* 1.73*  --   LATICACIDVEN  --    < > 0.9  --  0.9  --   --  0.7 0.7  --   --  0.7  VANCOTROUGH 26*  --   --   --   --   --   --   --   --   --  27*  --    < > = values in this interval not displayed.    Estimated Creatinine Clearance: 69.7 mL/min (A) (by C-G formula based on SCr of 1.73 mg/dL (H)).    Allergies  Allergen Reactions  . Orange Fruit Anaphylaxis, Hives and Other (See Comments)    Per Pt- Blisters around lips and Hives all over, also  . Penicillins Anaphylaxis    Did it involve swelling of the face/tongue/throat, SOB, or low BP? Yes Did it involve sudden or severe rash/hives, skin peeling, or any reaction on the inside of your mouth or nose? Yes Did you need to seek medical attention at a hospital or doctor's office? No When did it last happen?childhood If all above answers are "NO", may proceed with cephalosporin use.  [name]  [Empagliflozin] Itching  . [name] [Insulin Glargine] Nausea And Vomiting    Antimicrobials this admission: Vancomycin 12/15 >>  Meropenem 12/16 >>   Dose adjustments this admission: 12/18: VT 26 - decrease to 750 mg IV q12 hrs 12/19: starting CRRT - adjust Vanc to 1500 mg IV q24 hrs 12/21 0 VT 27 - decrease Vanc to 1250 q24hr  Microbiology results: 12/16 BCx: ngtd 12/14 BCx: ngtd  12/11 MRSA PCR: neg  Alanda Slim, PharmD, [facility] Clinical Pharmacist Please see AMION for all Pharmacists' Contact Phone Numbers 01/18/2020, 4:18 PM

## 2020-01-18 NOTE — Plan of Care (Signed)
  Problem: Coping: Goal: Level of anxiety will decrease Outcome: Progressing   Problem: Skin Integrity: Goal: Risk for impaired skin integrity will decrease Outcome: Progressing   Problem: Clinical Measurements: Goal: Will remain free from infection Outcome: Progressing Goal: Respiratory complications will improve Outcome: Progressing   Problem: Education: Goal: Ability to verbalize understanding of medication therapies will improve Outcome: Not Progressing   Problem: Cardiac: Goal: Ability to achieve and maintain adequate cardiopulmonary perfusion will improve Outcome: Progressing   Problem: Activity: Goal: Capacity to carry out activities will improve Outcome: Not Progressing

## 2020-01-18 NOTE — Progress Notes (Signed)
[place] for heparin Indication: Impella 5.5  Allergies  Allergen Reactions  . Orange Fruit Anaphylaxis, Hives and Other (See Comments)    Per Pt- Blisters around lips and Hives all over, also  . Penicillins Anaphylaxis    Did it involve swelling of the face/tongue/throat, SOB, or low BP? Yes Did it involve sudden or severe rash/hives, skin peeling, or any reaction on the inside of your mouth or nose? Yes Did you need to seek medical attention at a hospital or doctor's office? No When did it last happen?childhood If all above answers are "NO", may proceed with cephalosporin use.  [name] [Empagliflozin] Itching  . [name] [Insulin Glargine] Nausea And Vomiting    Patient Measurements: Height: 6\' 3"  (190.5 cm) Weight: 117 kg (257 lb 15 oz) IBW/kg (Calculated) : 84.5 Heparin dosing wt: 101 kg  Vital Signs: Temp: 98.06 F (36.7 C) (12/21 1300) Temp Source: Core (12/21 1200) BP: 93/67 (12/21 0812) Pulse Rate: 113 (12/21 1300)  Labs: Recent Labs    01/16/20 0349 01/16/20 0427 01/17/20 0325 01/17/20 0326 01/17/20 0806 01/17/20 1525 01/17/20 1532 01/18/20 0359 01/18/20 0430  HGB 8.6*   < >  --   --  9.1* 8.7* 8.8* 9.9* 9.0*  HCT 26.2*   < >  --   --  28.6* 27.2* 26.0* 29.0* 28.8*  PLT 187   < >  --   --  222 207  --   --  231  HEPARINUNFRC <0.10*  --  <0.10*  --   --   --   --   --  <0.10*  CREATININE 2.02*   < >  --  1.74*  --  1.66*  --   --  1.75*   < > = values in this interval not displayed.    Estimated Creatinine Clearance: 68.9 mL/min (A) (by C-G formula based on SCr of 1.75 mg/dL (H)).   Assessment: 49 yo male s/p impella 5.5 placement. Pt started on heparinized purge and systemic heparin. Pt now s/p CABG 12/15 with Impella remaining in place c/b significant bleeding and hematoma requiring opened chest and reexploration in OR 12/16.  Impella on P8 with stable pressures. Heparin running via purge only at  this time, purge flow rate 8.7 ml/hr (approx 435 units/hr heparin). Heparin level undetectable as expected, CBC stable this AM. No overt bleeding per discussion with nursing.  Goal of Therapy:  Heparin level 0.2-0.5 units/ml Monitor platelets by anticoagulation protocol: Yes   Plan:  Continue heparin via the purge only. Will follow with CVTS regarding systemic heparin once appropriate to initiate Daily heparin level for now  [name], [name] Reason, [facility] Clinical Pharmacist  01/18/2020 2:08 PM   [facility] pharmacy phone numbers are listed on [email or web address]  Please check [email or web address] for unit-specific pharmacy phone numbers.

## 2020-01-18 NOTE — Progress Notes (Signed)
Nutrition Follow-up  DOCUMENTATION CODES:   Not applicable  INTERVENTION:   Tube Feeding via Cortrak:  Pivot 1.5 at 60 ml/hr Add Pro-Source TF TID Provides 168 g of protein, 2280 kcals and 1094 mL of free water  Add B-complex with C to replace losses from CRRT   NUTRITION DIAGNOSIS:   Inadequate oral intake related to acute illness as evidenced by NPO status.  Being addressed via TF   GOAL:   Patient will meet greater than or equal to 90% of their needs  Met via TF  MONITOR:   Weight trends,Vent status  REASON FOR ASSESSMENT:   Ventilator    ASSESSMENT:   49 yo male admitted with Afib with RVR requiring cardioversion, ischemic CM requiring inotropes and impella support and ultimately underwent CABG x 4 on 12/15 with Impella still in place. PMH includes CAD, benign stomach cancer, CHF, DM, depression, CM, GERD, HTN  12/02 Admitted 12/08 Transferred to ICU 12/11 Impella LVAD placed 12/15 Redo Sternotomy, CABG on pump x 4, Intubated 12/16 Bedside chest exploration for tamponade with moderate amount of anterior mediastinal clot removed, OR for bleeding s/p CABG for mediastinal washout 12/17 Cortrak placed-duodenal  12/19 CRRT initiated  Pt remains sedated on vent support, chest remains open, continued support with Impella. Remains on multiple pressors/intorpoes Per MD notes, may take several more days of volume removal to allow for chest closure MAP >65, diastolic >50  Tolerating Pivot 1.5 at 60 ml/hr via Cortrak tube  Current wt 117 kg; lowest weight 102 kg.   Labs: reviewed Meds: potassium phosphate, miralax, insulin drip  Diet Order:   Diet Order    None      EDUCATION NEEDS:   Not appropriate for education at this time  Skin:  Skin Assessment: Skin Integrity Issues: Skin Integrity Issues:: Incisions,Other (Comment) Incisions: stermun, chest tubes x 4 Other: venous stasis ulcers  Last BM:  12/21  Height:   Ht Readings from Last 1  Encounters:  01/15/2020 6' 3" (1.905 m)    Weight:   Wt Readings from Last 1 Encounters:  01/18/20 117 kg    Ideal Body Weight:  89 kg  BMI:  Body mass index is 32.24 kg/m.  Estimated Nutritional Needs:   Kcal:  2200-2400 kcals  Protein:  135-170 g  Fluid:  >/= 1.5 L   Cate  MS, RDN, LDN, CNSC Registered Dietitian III Clinical Nutrition RD Pager and On-Call Pager Number Located in Amion    

## 2020-01-18 NOTE — Progress Notes (Addendum)
Patient ID: Corey Palmer, male   DOB: August 19, 1970, 49 y.o.   MRN: [id number]     Advanced Heart Failure Rounding Note  PCP-Cardiologist: No primary care provider on file.   Subjective:    - 12/7 Torsades -ICD shock x1.  - 12/11 Impella 5.5 placed.  - 12/12 Impella repositioned in OR - 12/15 CABG x 4 with LIMA-LAD, SVG-PDA/PLV, SVG-OM - 12/16 DCCV afib.  Back to OR to re-open chest and again to evacuate hematoma.  - 12/19 CVVH initiated   Intubated/sedated. Chest open. Impella at P-8. On NE 22. Epi 4, VP 0.04, milrinone 0.125 and iNO 21.    Remains oliguric, 306 cc in UOP yesterday. Continues on CVVH for volume removal, pulling 250/hr currently. -9.8L removed yesterday. Wt down an additional 11 lb. Remains 32 lb above dry wt.   WBC remains elevated, 19>>16>>19K. Temp range 97.1 - 99.1. Continues on meropenem and vancomycin w/ open chest.    Swan numbers: CVP 16 PA 40/25 (30) Co/CI 5.6/2.4 Co-ox 55% PAPi 0.94  Impella 5.5 P-8 Flow 4.4 Heparin only in purge  Echo (12/10): EF <20%, moderate LV dilation, normal RV size with mildly decreased systolic function.    Objective:   Weight Range: 117 kg Body mass index is 32.24 kg/m.   Vital Signs:   Temp:  [97.16 F (36.2 C)-99.1 F (37.3 C)] 97.52 F (36.4 C) (12/21 0800) Pulse Rate:  [78-124] 115 (12/21 0812) Resp:  [20-27] 25 (12/21 0812) BP: (82-93)/(58-67) 93/67 (12/21 0812) SpO2:  [85 %-100 %] 99 % (12/21 0812) Arterial Line BP: (75-109)/(54-73) 93/66 (12/21 0800) FiO2 (%):  [40 %] 40 % (12/21 0812) Weight:  [062 kg] 117 kg (12/21 0500) Last BM Date: 01/18/20  Weight change: Filed Weights   01/16/20 0600 01/17/20 0515 01/18/20 0500  Weight: 126.9 kg 121.6 kg 117 kg    Intake/Output:   Intake/Output Summary (Last 24 hours) at 01/18/2020 0836 Last data filed at 01/18/2020 0800 Gross per 24 hour  Intake 4944.57 ml  Output 10395 ml  Net -5450.43 ml      Physical Exam   CVP 16  General:  Critically ill  intubated and sedated w/  open chest. Edematous  HEENT: normal + ETT Neck: supple. JVD 16cm. Carotids 2+ bilat; no bruits. No lymphadenopathy or thyromegaly appreciated. + rt axillary impella site stable  Cor: Open Chest, + CTs, Regular rhythm tachy rate. No rubs, gallops or murmurs. Lungs: intubated and course bilaterally  Abdomen: soft, nontender, nondistended. No hepatosplenomegaly. No bruits or masses. Good bowel sounds. Extremities: no cyanosis, clubbing, rash, 2-3+ bilateral LEE up to thighs + bilateral SCDs Neuro: intubated and sedated.  GU: + foley    Telemetry    Afib 110s  Personally reviewed   Labs    CBC Recent Labs    01/17/20 1525 01/17/20 1532 01/18/20 0359 01/18/20 0430  WBC 16.8*  --   --  19.8*  NEUTROABS  --   --   --  15.3*  HGB 8.7*   < > 9.9* 9.0*  HCT 27.2*   < > 29.0* 28.8*  MCV 90.7  --   --  92.0  PLT 207  --   --  231   < > = values in this interval not displayed.   Basic Metabolic Panel Recent Labs    01/17/20 0324 01/17/20 0326 01/17/20 1525 01/17/20 1532 01/18/20 0359 01/18/20 0430  NA  --    < > 136   < > 138 136  K  --    < > 3.8   < > 3.9 3.9  CL  --    < > 102  --   --  104  CO2  --    < > 25  --   --  24  GLUCOSE  --    < > 149*  --   --  158*  BUN  --    < > 30*  --   --  28*  CREATININE  --    < > 1.66*  --   --  1.75*  CALCIUM  --    < > 7.8*  --   --  8.2*  MG 2.1  --   --   --   --  2.3  PHOS  --    < > 2.7  --   --  2.8   < > = values in this interval not displayed.   Liver Function Tests Recent Labs    01/17/20 0324 01/17/20 0326 01/17/20 1525 01/18/20 0430  AST 172*  --   --  86*  ALT 262*  --   --  183*  ALKPHOS 168*  --   --  177*  BILITOT 1.3*  --   --  1.4*  PROT 5.5*  --   --  6.0*  ALBUMIN 2.1*   < > 2.1* 2.2*   < > = values in this interval not displayed.   No results for input(s): LIPASE, AMYLASE in the last 72 hours. Cardiac Enzymes No results for input(s): CKTOTAL, CKMB, CKMBINDEX, TROPONINI in  the last 72 hours.  BNP: BNP (last 3 results) Recent Labs    01/18/2020 1619  BNP 577.5*    ProBNP (last 3 results) No results for input(s): PROBNP in the last 8760 hours.   D-Dimer No results for input(s): DDIMER in the last 72 hours. Hemoglobin A1C No results for input(s): HGBA1C in the last 72 hours. Fasting Lipid Panel No results for input(s): CHOL, HDL, LDLCALC, TRIG, CHOLHDL, LDLDIRECT in the last 72 hours. Thyroid Function Tests No results for input(s): TSH, T4TOTAL, T3FREE, THYROIDAB in the last 72 hours.  Invalid input(s): FREET3  Other results:   Imaging    DG Chest Port 1 View  Result Date: 01/18/2020 CLINICAL DATA:  Intubation.  Chest tube. EXAM: PORTABLE CHEST 1 VIEW COMPARISON:  01/17/2020. FINDINGS: Endotracheal tube, feeding tube, Swan-Ganz catheter, mediastinal drainage catheters, bilateral chest tubes in stable position. Impella device in stable position. Cardiac pacer in stable position. Stable severe cardiomegaly. Diffuse bilateral interstitial infiltrates/edema again noted without interim change. Persistent bibasilar atelectasis. Tiny left pleural effusion cannot be excluded. No prominent pleural effusion noted. No pneumothorax. Surgical clips right axilla. IMPRESSION: 1. Lines and tubes including bilateral chest tubes in stable position. No pneumothorax. 2. Cardiac pacer and Impella device in stable position. Stable severe cardiomegaly. 3. Diffuse bilateral interstitial infiltrates/edema again noted without interim change. Persistent bibasilar atelectasis. Tiny left pleural effusion cannot be excluded. Electronically Signed   By: Dixon   On: 01/18/2020 06:56     Medications:     Scheduled Medications: . amiodarone  150 mg Intravenous Once  . aspirin  324 mg Per Tube Daily  . atorvastatin  80 mg Per Tube Daily  . bisacodyl  10 mg Oral Daily   Or  . bisacodyl  10 mg Rectal Daily  . busPIRone  10 mg Per Tube TID  . chlorhexidine gluconate  (MEDLINE KIT)  15 mL Mouth  Rinse BID  . Chlorhexidine Gluconate Cloth  6 each Topical Daily  . docusate  100 mg Per Tube BID  . feeding supplement (PIVOT 1.5 CAL)  1,000 mL Per Tube Q24H  . fentaNYL (SUBLIMAZE) injection  25 mcg Intravenous Once  . FLUoxetine  40 mg Per Tube BID  . mouth rinse  15 mL Mouth Rinse 10 times per day  . pantoprazole sodium  40 mg Per Tube Daily  . polyethylene glycol  17 g Per Tube Daily  . sodium chloride flush  10-40 mL Intracatheter Q12H  . sodium chloride flush  3 mL Intravenous Q12H  . valproic acid  250 mg Per Tube BID    Infusions: .  prismasol BGK 4/2.5 500 mL/hr at 01/16/20 1136  .  prismasol BGK 4/2.5 200 mL/hr at 01/17/20 1118  . sodium chloride Stopped (01/11/2020 1646)  . sodium chloride 10 mL/hr at 01/14/20 2101  . sodium chloride Stopped (01/16/20 1032)  . amiodarone 30 mg/hr (01/18/20 0800)  . epinephrine 4 mcg/min (01/18/20 0800)  . fentaNYL infusion INTRAVENOUS 150 mcg/hr (01/18/20 0800)  . impella catheter heparin 50 unit/mL in dextrose 5%    . insulin 1.2 mL/hr at 01/18/20 0800  . lactated ringers 20 mL/hr at 01/18/20 0800  . meropenem (MERREM) IV Stopped (01/18/20 0617)  . midazolam 4 mg/hr (01/18/20 0800)  . milrinone 0.125 mcg/kg/min (01/18/20 0800)  . norepinephrine (LEVOPHED) Adult infusion 22 mcg/min (01/18/20 0800)  . potassium chloride 50 mL/hr at 01/18/20 0800  . prismasol BGK 4/2.5 1,500 mL/hr at 01/17/20 1820  . vancomycin Stopped (01/17/20 1801)  . vasopressin 0.04 Units/min (01/18/20 0800)    PRN Medications: sodium chloride, sodium chloride, dextrose, fentaNYL, fentaNYL (SUBLIMAZE) injection, fentaNYL (SUBLIMAZE) injection, heparin, heparin, metoprolol tartrate, midazolam, midazolam, midazolam, ondansetron (ZOFRAN) IV, oxyCODONE, sodium chloride flush, sodium chloride flush     Assessment/Plan   1. Cardiogenic shock/acute on chronic systolic CHF: Initially nonischemic cardiomyopathy. Possible familial  cardiomyopathy as both parents had cardiomyopathy and died at around 18.However, [employer] gene testing did not show any common mutation for cardiomyopathy.  However, this admission noted to have severe 3 vessel disease so suspect component of ischemic cardiomyopathy. [employer] ICD. Echo in 8/20 with EF 15% and mildly decreased RV function. Echo this admission with EF < 20%, moderate LV dilation, RV mildly reduced, severe LAE, no significant MR.Low output HF with markedly low EF.  He has a long history of cardiomyopathy (20 yrs), tends to minimize symptoms.  Cardiorenal syndrome with creatinine up to 2.2,  stabilized on milrinone and Impella 5.5. SCr 1.44 day of CABG. CABG 12/15.  Post-op shock, back to OR 12/16 to open chest (chest wall compartment syndrome) and again to evacuate hematoma.  TEE post-op with severe RV dysfunction.  CVVHD initiated 12/19 for volume removal in setting of rising Scr/decreased UOP. Suspect ATN  - Continue CVVHD for fluid removal, goal pull rate is at least 200/hr to aggressively remove fluid to allow for chest closure. Still ~30 lb above dry wt, ~20 lb above pre-cabg wt.  - Need to keep renal perfusion high. Continue Impella at P-8. Will not wean until chest is closed. - Increase NE as needed to keep MAPs > 70 consistently and closer to 80-90 - Obtain repeat limited echo to check impella position. LDH 412 -> 468 -> 311->339->408.   - Continue EPI, NE, VP and Milrinone  2. Atrial fibrillation: H/o PAF.  He was on dofetilide in the remote past but this was stopped due  to noncompliance. He had an upper GI bleed from antral ulcers in 3/12. He was seen by GI and was deemed safe to restart anticoagulation as long as he remains on a PPI.  No apparent recurrence of AF until just prior to this admission, was cardioverted back to NSR in ER but went back into atrial fibrillation.  Post-op afib, DCCV to NSR/BiV pacing am 12/16, now back in atrial fibrillation Remains in AF - Continue  amiodarone at 30 mg/hr. - Would not try to cardiovert again until back on heparin gtt and vasoactive meds weaned.  - Off heparin post-op with bleeding, only getting heparin in Impella purge currently.  Restart systemic heparin gtt when surgery thinks safe to do so. No change - Unable to do Maze with CABG.    3. CAD:   Cath this admission with severe 3 vessel disease, ideally treated by CABG given extent of disease. CABG x 4 on 12/15.  - Continue atorvastatin, ASA.  - continue current management 4. Acute on chronic hypoxemic respiratory failure: Pulmonary edema, vent per CCM.  - Oxygenation worse in setting of worsening volume overload. Continue CVVHD. - CCM following 5. AKI on CKD stage ?3: Likely ATN/cardiorenal - SCr 1.74>>1.85>>2.02>>iniation of CVVH>>1.78>>1.74->1.75 - Needs fluid off to improve oxygenation and to permit chest closure.  - Continue CVVHD per Renal. Appreciate their help.  6. Diabetes: Insulin.  7. Hyponatremia: Resolved.  8. Torsades/ICD shock: 12/7/212 in hospital event, in setting of severe hypokalemia and hypomagnesemia.   9. Gout: h/o gout. Complained of rt knee pain c/w previous flares - treated w/ prednisone burst (completed).  10: ID: Tm 99.5 PCT 13.46 -> 8.65  Cultures NGTD.  - Covering with vancomycin/meropenem (PCN allergy).      Lyda Jester PA-C  01/18/2020 8:36 AM   Agree with above.  Continues on multiple pressors, Impella P-8 and CVVHD. Weight down 11 pounds. Chest remains open.  General:  Intubated sedated HEENT: normal Neck: supple. RIJ swan Carotids 2+ bilat; no bruits. No lymphadenopathy or thryomegaly appreciated. Cor: Chest open regular  Lungs: coarse + CTs Abdomen: obese soft, nontender, nondistended. No hepatosplenomegaly. No bruits or masses. Good bowel sounds. Extremities: no cyanosis, clubbing, rash, 2-3+ edema Neuro: sedated unresponsive  Continue volume removal with Impella and pressor support.   Impella viewed under  echo and repositioned.   D/w TCTS. Possible chest closure on Thursday.   CRITICAL CARE Performed by: Glori Bickers  Total critical care time: 45 minutes  Critical care time was exclusive of separately billable procedures and treating other patients.  Critical care was necessary to treat or prevent imminent or life-threatening deterioration.  Critical care was time spent personally by me (independent of midlevel providers or residents) on the following activities: development of treatment plan with patient and/or surrogate as well as nursing, discussions with consultants, evaluation of patient's response to treatment, examination of patient, obtaining history from patient or surrogate, ordering and performing treatments and interventions, ordering and review of laboratory studies, ordering and review of radiographic studies, pulse oximetry and re-evaluation of patient's condition.  Glori Bickers, MD  5:07 PM

## 2020-01-18 NOTE — Progress Notes (Signed)
Patient ID: Corey Palmer, male   DOB: [birth year], 49 y.o.   MRN: [id number] S: Able to UF 5 liters overnight.  Pt was seen and examined while on CRRT. O:BP 93/67   Pulse (!) 115   Temp (!) 97.52 F (36.4 C) (Rectal)   Resp (!) 25   Ht '6\' 3"'  (1.905 m)   Wt 117 kg   SpO2 99%   BMI 32.24 kg/m   Intake/Output Summary (Last 24 hours) at 01/18/2020 0923 Last data filed at 01/18/2020 0900 Gross per 24 hour  Intake 5069.45 ml  Output 10415 ml  Net -5345.55 ml   Intake/Output: I/O last 3 completed shifts: In: 8101 [P.O.:15; I.V.:3993.6; Other:291.5; NG/GT:2100; IV Piggyback:1071.9] Out: 75102 [Urine:604; HENID:78242; Chest Tube:380]  Intake/Output this shift:  Total I/O In: 503.4 [I.V.:301.2; Other:16; NG/GT:120; IV Piggyback:66.1] Out: 921 [Urine:10; Other:911] Weight change: -4.6 kg Gen: intubated/sedated CVS: tachy at 115, open median sternotomy, impella sounds audible. Resp: ventilated breath sounds present Abd: hypoactive BS, soft Ext: 2+ bilateral lower extremity edema to thighs  Recent Labs  Lab 12/28/2019 1022 01/08/2020 1026 01/14/20 1700 01/14/20 1722 01/15/20 0100 01/15/20 0519 01/15/20 0622 01/15/20 1733 01/15/20 1741 01/16/20 0349 01/16/20 0427 01/16/20 1634 01/16/20 1636 01/17/20 0314 01/17/20 0324 01/17/20 0326 01/17/20 1525 01/17/20 1532 01/18/20 0359 01/18/20 0430  NA  --    < > 136   < > 136   < > 137 137   < > 138   < > 137 139 139  --  137 136 139 138 136  K  --    < > 3.9   < > 3.5   < > 3.9 3.6   < > 3.6   < > 3.2* 3.3* 3.8  --  3.7 3.8 3.9 3.9 3.9  CL  --    < > 104  --  103  --  103 104  --  104  --  104  --   --   --  101 102  --   --  104  CO2  --    < > 23  --  22  --  24 24  --  24  --  24  --   --   --  24 25  --   --  24  GLUCOSE  --    < > 121*  --  142*  --  131* 153*  --  161*  --  182*  --   --   --  160* 149*  --   --  158*  BUN  --    < > 27*  --  30*  --  30* 32*  --  36*  --  36*  --   --   --  33* 30*  --   --  28*  CREATININE   --    < > 1.70*  --  1.65*  --  1.74* 1.85*  --  2.02*  --  1.78*  --   --   --  1.74* 1.66*  --   --  1.75*  ALBUMIN 2.4*  --  2.3*  --   --   --   --   --   --  2.0*  --  2.0*  --   --  2.1* 2.0* 2.1*  --   --  2.2*  CALCIUM  --    < > 8.2*  --  8.2*  --  8.2* 8.1*  --  8.0*  --  7.9*  --   --   --  8.0* 7.8*  --   --  8.2*  PHOS  --   --  4.3  --  4.1  --   --  3.7  --   --   --  3.3  --   --   --  3.1 2.7  --   --  2.8  AST 173*  --  264*  --   --   --   --   --   --  350*  --   --   --   --  172*  --   --   --   --  86*  ALT 85*  --  171*  --   --   --   --   --   --  335*  --   --   --   --  262*  --   --   --   --  183*   < > = values in this interval not displayed.   Liver Function Tests: Recent Labs  Lab 01/16/20 0349 01/16/20 1634 01/17/20 0324 01/17/20 0326 01/17/20 1525 01/18/20 0430  AST 350*  --  172*  --   --  86*  ALT 335*  --  262*  --   --  183*  ALKPHOS 155*  --  168*  --   --  177*  BILITOT 1.6*  --  1.3*  --   --  1.4*  PROT 5.0*  --  5.5*  --   --  6.0*  ALBUMIN 2.0*   < > 2.1* 2.0* 2.1* 2.2*   < > = values in this interval not displayed.   No results for input(s): LIPASE, AMYLASE in the last 168 hours. No results for input(s): AMMONIA in the last 168 hours. CBC: Recent Labs  Lab 01/16/20 1634 01/16/20 1636 01/17/20 0324 01/17/20 0806 01/17/20 1525 01/17/20 1532 01/18/20 0359 01/18/20 0430  WBC 18.8*  --  19.1* 19.8* 16.8*  --   --  19.8*  NEUTROABS  --   --   --   --   --   --   --  15.3*  HGB 8.8*   < > 8.9* 9.1* 8.7* 8.8* 9.9* 9.0*  HCT 26.9*   < > 27.6* 28.6* 27.2* 26.0* 29.0* 28.8*  MCV 87.6  --  89.0 90.5 90.7  --   --  92.0  PLT 201  --  207 222 207  --   --  231   < > = values in this interval not displayed.   Cardiac Enzymes: No results for input(s): CKTOTAL, CKMB, CKMBINDEX, TROPONINI in the last 168 hours. CBG: Recent Labs  Lab 01/18/20 0359 01/18/20 0508 01/18/20 0605 01/18/20 0653 01/18/20 0759  GLUCAP 141* 123* 171* 122* 194*     Iron Studies: No results for input(s): IRON, TIBC, TRANSFERRIN, FERRITIN in the last 72 hours. Studies/Results: DG Chest Port 1 View  Result Date: 01/18/2020 CLINICAL DATA:  Intubation.  Chest tube. EXAM: PORTABLE CHEST 1 VIEW COMPARISON:  01/17/2020. FINDINGS: Endotracheal tube, feeding tube, Swan-Ganz catheter, mediastinal drainage catheters, bilateral chest tubes in stable position. Impella device in stable position. Cardiac pacer in stable position. Stable severe cardiomegaly. Diffuse bilateral interstitial infiltrates/edema again noted without interim change. Persistent bibasilar atelectasis. Tiny left pleural effusion cannot be excluded. No prominent pleural effusion noted. No pneumothorax. Surgical clips right axilla. IMPRESSION: 1. Lines and tubes including  bilateral chest tubes in stable position. No pneumothorax. 2. Cardiac pacer and Impella device in stable position. Stable severe cardiomegaly. 3. Diffuse bilateral interstitial infiltrates/edema again noted without interim change. Persistent bibasilar atelectasis. Tiny left pleural effusion cannot be excluded. Electronically Signed   By: Marcello Moores  Register   On: 01/18/2020 06:56   DG Chest Port 1 View  Result Date: 01/17/2020 CLINICAL DATA:  Intubation.  Chest tube.  Open sternum. EXAM: PORTABLE CHEST 1 VIEW COMPARISON:  Chest x-ray 01/16/2020. FINDINGS: Endotracheal tube, feeding tube, Swan-Ganz catheter, bilateral chest tubes, mediastinal drainage tube in stable position. Cardiac pacer and Impella device in stable position. Stable cardiomegaly. Diffuse bilateral pulmonary infiltrates/edema again noted with slight improvement. Persistent bibasilar atelectasis again noted. No prominent pleural effusion. No pneumothorax. Surgical staples noted over right shoulder. IMPRESSION: 1. Lines and tubes including bilateral chest tubes in stable position. No pneumothorax. 2. Cardiac pacer and Impella device in stable position. Stable cardiomegaly. 3.  Diffuse bilateral pulmonary infiltrates/edema again noted with slight improvement. Persistent bibasilar atelectasis again noted. Electronically Signed   By: Marcello Moores  Register   On: 01/17/2020 06:27   . aspirin  324 mg Per Tube Daily  . atorvastatin  80 mg Per Tube Daily  . bisacodyl  10 mg Oral Daily   Or  . bisacodyl  10 mg Rectal Daily  . busPIRone  10 mg Per Tube TID  . chlorhexidine gluconate (MEDLINE KIT)  15 mL Mouth Rinse BID  . Chlorhexidine Gluconate Cloth  6 each Topical Daily  . docusate  100 mg Per Tube BID  . feeding supplement (PIVOT 1.5 CAL)  1,000 mL Per Tube Q24H  . fentaNYL (SUBLIMAZE) injection  25 mcg Intravenous Once  . FLUoxetine  40 mg Per Tube BID  . mouth rinse  15 mL Mouth Rinse 10 times per day  . pantoprazole sodium  40 mg Per Tube Daily  . polyethylene glycol  17 g Per Tube Daily  . sodium chloride flush  10-40 mL Intracatheter Q12H  . sodium chloride flush  3 mL Intravenous Q12H  . valproic acid  250 mg Per Tube BID    BMET    Component Value Date/Time   NA 136 01/18/2020 0430   NA 138 09/08/2018 0924   K 3.9 01/18/2020 0430   CL 104 01/18/2020 0430   CO2 24 01/18/2020 0430   GLUCOSE 158 (H) 01/18/2020 0430   BUN 28 (H) 01/18/2020 0430   BUN 23 09/08/2018 0924   CREATININE 1.75 (H) 01/18/2020 0430   CREATININE 1.13 05/13/2013 1533   CALCIUM 8.2 (L) 01/18/2020 0430   GFRNONAA 47 (L) 01/18/2020 0430   GFRNONAA 79 05/13/2013 1533   GFRAA >60 03/08/2019 1515   GFRAA >89 05/13/2013 1533   CBC    Component Value Date/Time   WBC 19.8 (H) 01/18/2020 0430   RBC 3.13 (L) 01/18/2020 0430   HGB 9.0 (L) 01/18/2020 0430   HGB 16.2 03/23/2018 1535   HCT 28.8 (L) 01/18/2020 0430   HCT 47.0 03/23/2018 1535   PLT 231 01/18/2020 0430   PLT 251 03/23/2018 1535   MCV 92.0 01/18/2020 0430   MCV 82 03/23/2018 1535   MCH 28.8 01/18/2020 0430   MCHC 31.3 01/18/2020 0430   RDW 18.9 (H) 01/18/2020 0430   RDW 15.0 03/23/2018 1535   LYMPHSABS 1.0 01/18/2020  0430   LYMPHSABS 2.5 03/23/2018 1535   MONOABS 1.9 (H) 01/18/2020 0430   EOSABS 0.6 (H) 01/18/2020 0430   EOSABS 0.2 03/23/2018 1535  BASOSABS 0.1 01/18/2020 0430   BASOSABS 0.0 03/23/2018 1535    Assessment/Plan:  1. AKI/CKD stage III- presumably due to ischemic ATN in setting of cardiogenic shock s/p CABG.  Initially with good response with lasix drip, however starting to have decrease in UOP following marked UF with CRRT overnight.   1. CRRT started 01/16/20 2. All fluids 4K/2.5Ca:  Pre-filter 500 ml/hr, post filter 200 ml/hr, dialysate 1500 ml/hr 3. UF goal 100-200 however nsg been pulling 300 ml/hr overnight (net UF of 8.3 liters) 4. No heparin due to hematoma 5. Off lasix drip 01/17/20 due to oliguria and UF with CRRT 6. Do not exceed 100-200 ml/hr UF and keep MAP >65 for renoprotection 7. Right femoral HD catheter placed 01/16/20 2. CAD s/p CABG complicated by cardiogenic shock and post-op hematoma.  Currently with an open chest and awaiting improvement of volume status for secondary closure 3. Cardiogenic shock- underlying familial cardiomyopathy s/p redo CABG.  Remains on pressors:  Levophed, epinephrine, and vasopressin. Impella and milrinone.  1. Tolerating UF with CRRT and goal is 100-200 ml/hr.   2. Down 10 kg from peak volume but still above goal edw for chest closure. 3. Cont with UF of 200 ml/hr unless he requires more pressor support, then decrease to 100 ml/hr. 4. ABLA- s/p postoperative bleeding into chest cavity s/p re-exploration for tamponade/hematoma.  CT surgery following.  5. Hypophosphatemia- due to CRRT.  Will replete with K phos and follow.  6. Atrial fibrillation - s/p DCCV on 12/16 7. VDRF- per primary svc 8. ID - rising WBC.  On vancomycin and meropenem.   Donetta Potts, MD [employer] [phone]

## 2020-01-18 NOTE — Progress Notes (Signed)
  Echocardiogram 2D Echocardiogram has been performed.  Matilde Bash 01/18/2020, 10:26 AM

## 2020-01-18 NOTE — Progress Notes (Signed)
5 Days Post-Op Procedure(s) (LRB): RE-EXPLORATION POST OPERATIVE OPEN HEART (N/A) TRANSESOPHAGEAL ECHOCARDIOGRAM (TEE) (N/A) Subjective:  Remains sedated on vent.  Has had some VT and frequent PVC's  Objective: Vital signs in last 24 hours: Temp:  [97.16 F (36.2 C)-99.1 F (37.3 C)] 97.52 F (36.4 C) (12/21 0800) Pulse Rate:  [78-124] 115 (12/21 0812) Cardiac Rhythm: Sinus tachycardia;Atrial fibrillation (12/21 0800) Resp:  [20-27] 25 (12/21 0812) BP: (82-93)/(58-67) 93/67 (12/21 0812) SpO2:  [85 %-100 %] 99 % (12/21 0812) Arterial Line BP: (75-109)/(54-73) 93/66 (12/21 0800) FiO2 (%):  [40 %] 40 % (12/21 0812) Weight:  [259 kg] 117 kg (12/21 0500)  Hemodynamic parameters for last 24 hours: PAP: (31-46)/(19-28) 46/24 CVP:  [11 mmHg-21 mmHg] 16 mmHg CO:  [5.6 L/min-6 L/min] 5.6 L/min CI:  [2.4 L/min/m2-2.6 L/min/m2] 2.4 L/min/m2  Intake/Output from previous day: 12/20 0701 - 12/21 0700 In: 4921 [I.V.:2645.8; NG/GT:1380; IV Piggyback:699.9] Out: 10350 [Urine:306; Chest Tube:200] Intake/Output this shift: Total I/O In: 503.4 [I.V.:301.2; Other:16; NG/GT:120; IV Piggyback:66.1] Out: 921 [Urine:10; Other:911]  General appearance: intubated and sedated on vent. Neurologic: following commands per nurse. sedated on fentanyl and versed drips. Heart: irregularly irregular rhythm Lungs: coarse bilaterally Abdomen: soft, non-tender; bowel sounds normal Extremities: anasarca. Feet and hands pink with no mottling. Wound: chest dressing intact. sternal fixation stable.  Lab Results: Recent Labs    01/17/20 1525 01/17/20 1532 01/18/20 0359 01/18/20 0430  WBC 16.8*  --   --  19.8*  HGB 8.7*   < > 9.9* 9.0*  HCT 27.2*   < > 29.0* 28.8*  PLT 207  --   --  231   < > = values in this interval not displayed.   BMET:  Recent Labs    01/17/20 1525 01/17/20 1532 01/18/20 0359 01/18/20 0430  NA 136   < > 138 136  K 3.8   < > 3.9 3.9  CL 102  --   --  104  CO2 25  --   --   24  GLUCOSE 149*  --   --  158*  BUN 30*  --   --  28*  CREATININE 1.66*  --   --  1.75*  CALCIUM 7.8*  --   --  8.2*   < > = values in this interval not displayed.    PT/INR: No results for input(s): LABPROT, INR in the last 72 hours. ABG    Component Value Date/Time   PHART 7.324 (L) 01/18/2020 0359   HCO3 26.5 01/18/2020 0359   TCO2 28 01/18/2020 0359   ACIDBASEDEF 2.0 01/14/2020 1722   O2SAT 53.7 01/18/2020 0450   CBG (last 3)  Recent Labs    01/18/20 0605 01/18/20 0653 01/18/20 0759  GLUCAP 171* 122* 194*   Narrative & Impression  CLINICAL DATA:  Intubation.  Chest tube.  EXAM: PORTABLE CHEST 1 VIEW  COMPARISON:  01/17/2020.  FINDINGS: Endotracheal tube, feeding tube, Swan-Ganz catheter, mediastinal drainage catheters, bilateral chest tubes in stable position. Impella device in stable position. Cardiac pacer in stable position. Stable severe cardiomegaly. Diffuse bilateral interstitial infiltrates/edema again noted without interim change. Persistent bibasilar atelectasis. Tiny left pleural effusion cannot be excluded. No prominent pleural effusion noted. No pneumothorax. Surgical clips right axilla.  IMPRESSION: 1. Lines and tubes including bilateral chest tubes in stable position. No pneumothorax. 2. Cardiac pacer and Impella device in stable position. Stable severe cardiomegaly. 3. Diffuse bilateral interstitial infiltrates/edema again noted without interim change. Persistent bibasilar atelectasis. Tiny left pleural effusion  cannot be excluded.   Electronically Signed   By: Marcello Moores  Register   On: 01/18/2020 06:56     Assessment/Plan:  POD 6 CABG POD 5 Re-exploration for hematoma/tamponade/chest compartment syndrome.  Hemodynamics have been stable but requiring higher doses of levophed to maintain MAP probably related to volume removal, increased sedation. CI 2.4 and Co-ox down to 54 this am. Impella at P-8. With VT will check limited echo  for Impella postion. Arterial waveform is very variable which may mean over emptying of ventricle. May need to turn down speed.   Milrinone 0.125 Epi 4 NE increased to 22 Vaso 0.04  Aim for MAP >70.  VDRF secondary to open chest and marked volume overload. Continue vent support per CCM.  Marked volume excess: removing 200+ cc per hr with CRRT. -9.8 L yesterday. Wt down 10 lbs but still 23 lbs over preop. Oliguria at 15 cc/hr.  Atrial fib with controlled rate on IV amio. Would not anticoagulate with open chest due to high risk of bleeding. On heparin in Impella purge.  Goal at this time is to remove excess volume to allow resolution of pulmonary and tissue edema to allow closure of chest. This may take a couple more days.   Chest tube output low.  Discussed status and plans with wife at bedside.  LOS: 19 days    Gaye Pollack 01/18/2020

## 2020-01-19 ENCOUNTER — Inpatient Hospital Stay (HOSPITAL_COMMUNITY): Payer: BC Managed Care – PPO

## 2020-01-19 DIAGNOSIS — I5023 Acute on chronic systolic (congestive) heart failure: Secondary | ICD-10-CM

## 2020-01-19 DIAGNOSIS — J9601 Acute respiratory failure with hypoxia: Secondary | ICD-10-CM | POA: Diagnosis not present

## 2020-01-19 LAB — COMPREHENSIVE METABOLIC PANEL
ALT: 131 U/L — ABNORMAL HIGH (ref 0–44)
AST: 59 U/L — ABNORMAL HIGH (ref 15–41)
Albumin: 2.2 g/dL — ABNORMAL LOW (ref 3.5–5.0)
Alkaline Phosphatase: 182 U/L — ABNORMAL HIGH (ref 38–126)
Anion gap: 9 (ref 5–15)
BUN: 29 mg/dL — ABNORMAL HIGH (ref 6–20)
CO2: 23 mmol/L (ref 22–32)
Calcium: 8.1 mg/dL — ABNORMAL LOW (ref 8.9–10.3)
Chloride: 103 mmol/L (ref 98–111)
Creatinine, Ser: 1.87 mg/dL — ABNORMAL HIGH (ref 0.61–1.24)
GFR, Estimated: 43 mL/min — ABNORMAL LOW (ref >60)
Glucose, Bld: 127 mg/dL — ABNORMAL HIGH (ref 70–99)
Potassium: 4 mmol/L (ref 3.5–5.1)
Sodium: 135 mmol/L (ref 135–145)
Total Bilirubin: 1 mg/dL (ref 0.3–1.2)
Total Protein: 6 g/dL — ABNORMAL LOW (ref 6.5–8.1)

## 2020-01-19 LAB — CBC WITH DIFFERENTIAL/PLATELET
Abs Immature Granulocytes: 0.82 10*3/uL — ABNORMAL HIGH (ref 0.00–0.07)
Basophils Absolute: 0.2 10*3/uL — ABNORMAL HIGH (ref 0.0–0.1)
Basophils Relative: 1 %
Eosinophils Absolute: 0.5 10*3/uL (ref 0.0–0.5)
Eosinophils Relative: 2 %
HCT: 28.1 % — ABNORMAL LOW (ref 39.0–52.0)
Hemoglobin: 9 g/dL — ABNORMAL LOW (ref 13.0–17.0)
Immature Granulocytes: 4 %
Lymphocytes Relative: 6 %
Lymphs Abs: 1.1 10*3/uL (ref 0.7–4.0)
MCH: 29.6 pg (ref 26.0–34.0)
MCHC: 32 g/dL (ref 30.0–36.0)
MCV: 92.4 fL (ref 80.0–100.0)
Monocytes Absolute: 1.9 10*3/uL — ABNORMAL HIGH (ref 0.1–1.0)
Monocytes Relative: 10 %
Neutro Abs: 14.5 10*3/uL — ABNORMAL HIGH (ref 1.7–7.7)
Neutrophils Relative %: 77 %
Platelets: 279 10*3/uL (ref 150–400)
RBC: 3.04 MIL/uL — ABNORMAL LOW (ref 4.22–5.81)
RDW: 20.1 % — ABNORMAL HIGH (ref 11.5–15.5)
WBC: 18.9 10*3/uL — ABNORMAL HIGH (ref 4.0–10.5)
nRBC: 2.8 % — ABNORMAL HIGH (ref 0.0–0.2)

## 2020-01-19 LAB — COOXEMETRY PANEL
Carboxyhemoglobin: 1.8 % — ABNORMAL HIGH (ref 0.5–1.5)
Carboxyhemoglobin: 1.7 % — ABNORMAL HIGH (ref 0.5–1.5)
Methemoglobin: 1.2 % (ref 0.0–1.5)
Methemoglobin: 0.9 % (ref 0.0–1.5)
O2 Saturation: 78.9 %
O2 Saturation: 69.8 %
Total hemoglobin: 9.3 g/dL — ABNORMAL LOW (ref 12.0–16.0)
Total hemoglobin: 9.2 g/dL — ABNORMAL LOW (ref 12.0–16.0)

## 2020-01-19 LAB — POCT I-STAT 7, (LYTES, BLD GAS, ICA,H+H)
Acid-Base Excess: 0 mmol/L (ref 0.0–2.0)
Acid-Base Excess: 0 mmol/L (ref 0.0–2.0)
Acid-base deficit: 2 mmol/L (ref 0.0–2.0)
Acid-base deficit: 1 mmol/L (ref 0.0–2.0)
Bicarbonate: 26.8 mmol/L (ref 20.0–28.0)
Bicarbonate: 25.4 mmol/L (ref 20.0–28.0)
Bicarbonate: 25 mmol/L (ref 20.0–28.0)
Bicarbonate: 25.4 mmol/L (ref 20.0–28.0)
Calcium, Ion: 1.2 mmol/L (ref 1.15–1.40)
Calcium, Ion: 1.17 mmol/L (ref 1.15–1.40)
Calcium, Ion: 1.19 mmol/L (ref 1.15–1.40)
Calcium, Ion: 1.18 mmol/L (ref 1.15–1.40)
HCT: 30 % — ABNORMAL LOW (ref 39.0–52.0)
HCT: 28 % — ABNORMAL LOW (ref 39.0–52.0)
HCT: 31 % — ABNORMAL LOW (ref 39.0–52.0)
HCT: 30 % — ABNORMAL LOW (ref 39.0–52.0)
Hemoglobin: 10.2 g/dL — ABNORMAL LOW (ref 13.0–17.0)
Hemoglobin: 9.5 g/dL — ABNORMAL LOW (ref 13.0–17.0)
Hemoglobin: 10.5 g/dL — ABNORMAL LOW (ref 13.0–17.0)
Hemoglobin: 10.2 g/dL — ABNORMAL LOW (ref 13.0–17.0)
O2 Saturation: 97 %
O2 Saturation: 99 %
O2 Saturation: 96 %
O2 Saturation: 96 %
Patient temperature: 36.8
Patient temperature: 36.8
Patient temperature: 36.8
Patient temperature: 36.4
Potassium: 4.4 mmol/L (ref 3.5–5.1)
Potassium: 4.1 mmol/L (ref 3.5–5.1)
Potassium: 4.2 mmol/L (ref 3.5–5.1)
Potassium: 4.2 mmol/L (ref 3.5–5.1)
Sodium: 136 mmol/L (ref 135–145)
Sodium: 136 mmol/L (ref 135–145)
Sodium: 137 mmol/L (ref 135–145)
Sodium: 137 mmol/L (ref 135–145)
TCO2: 28 mmol/L (ref 22–32)
TCO2: 27 mmol/L (ref 22–32)
TCO2: 27 mmol/L (ref 22–32)
TCO2: 27 mmol/L (ref 22–32)
pCO2 arterial: 52.1 mmHg — ABNORMAL HIGH (ref 32.0–48.0)
pCO2 arterial: 44.1 mmHg (ref 32.0–48.0)
pCO2 arterial: 53 mmHg — ABNORMAL HIGH (ref 32.0–48.0)
pCO2 arterial: 46.9 mmHg (ref 32.0–48.0)
pH, Arterial: 7.318 — ABNORMAL LOW (ref 7.350–7.450)
pH, Arterial: 7.367 (ref 7.350–7.450)
pH, Arterial: 7.28 — ABNORMAL LOW (ref 7.350–7.450)
pH, Arterial: 7.339 — ABNORMAL LOW (ref 7.350–7.450)
pO2, Arterial: 102 mmHg (ref 83.0–108.0)
pO2, Arterial: 125 mmHg — ABNORMAL HIGH (ref 83.0–108.0)
pO2, Arterial: 94 mmHg (ref 83.0–108.0)
pO2, Arterial: 84 mmHg (ref 83.0–108.0)

## 2020-01-19 LAB — ECHOCARDIOGRAM LIMITED
Height: 75 in
Weight: 4119.96 oz

## 2020-01-19 LAB — RENAL FUNCTION PANEL
Albumin: 2.1 g/dL — ABNORMAL LOW (ref 3.5–5.0)
Anion gap: 10 (ref 5–15)
BUN: 32 mg/dL — ABNORMAL HIGH (ref 6–20)
CO2: 22 mmol/L (ref 22–32)
Calcium: 7.9 mg/dL — ABNORMAL LOW (ref 8.9–10.3)
Chloride: 103 mmol/L (ref 98–111)
Creatinine, Ser: 1.91 mg/dL — ABNORMAL HIGH (ref 0.61–1.24)
GFR, Estimated: 42 mL/min — ABNORMAL LOW (ref >60)
Glucose, Bld: 156 mg/dL — ABNORMAL HIGH (ref 70–99)
Phosphorus: 2.4 mg/dL — ABNORMAL LOW (ref 2.5–4.6)
Potassium: 4.1 mmol/L (ref 3.5–5.1)
Sodium: 135 mmol/L (ref 135–145)

## 2020-01-19 LAB — GLUCOSE, CAPILLARY
Glucose-Capillary: 124 mg/dL — ABNORMAL HIGH (ref 70–99)
Glucose-Capillary: 121 mg/dL — ABNORMAL HIGH (ref 70–99)
Glucose-Capillary: 138 mg/dL — ABNORMAL HIGH (ref 70–99)
Glucose-Capillary: 137 mg/dL — ABNORMAL HIGH (ref 70–99)
Glucose-Capillary: 131 mg/dL — ABNORMAL HIGH (ref 70–99)
Glucose-Capillary: 136 mg/dL — ABNORMAL HIGH (ref 70–99)
Glucose-Capillary: 262 mg/dL — ABNORMAL HIGH (ref 70–99)
Glucose-Capillary: 85 mg/dL (ref 70–99)
Glucose-Capillary: 94 mg/dL (ref 70–99)
Glucose-Capillary: 129 mg/dL — ABNORMAL HIGH (ref 70–99)
Glucose-Capillary: 162 mg/dL — ABNORMAL HIGH (ref 70–99)
Glucose-Capillary: 166 mg/dL — ABNORMAL HIGH (ref 70–99)
Glucose-Capillary: 134 mg/dL — ABNORMAL HIGH (ref 70–99)
Glucose-Capillary: 176 mg/dL — ABNORMAL HIGH (ref 70–99)
Glucose-Capillary: 161 mg/dL — ABNORMAL HIGH (ref 70–99)
Glucose-Capillary: 156 mg/dL — ABNORMAL HIGH (ref 70–99)
Glucose-Capillary: 152 mg/dL — ABNORMAL HIGH (ref 70–99)
Glucose-Capillary: 160 mg/dL — ABNORMAL HIGH (ref 70–99)
Glucose-Capillary: 154 mg/dL — ABNORMAL HIGH (ref 70–99)
Glucose-Capillary: 123 mg/dL — ABNORMAL HIGH (ref 70–99)
Glucose-Capillary: 148 mg/dL — ABNORMAL HIGH (ref 70–99)

## 2020-01-19 LAB — LACTATE DEHYDROGENASE: LDH: 479 U/L — ABNORMAL HIGH (ref 98–192)

## 2020-01-19 LAB — MAGNESIUM: Magnesium: 2.4 mg/dL (ref 1.7–2.4)

## 2020-01-19 LAB — PHOSPHORUS: Phosphorus: 2.8 mg/dL (ref 2.5–4.6)

## 2020-01-19 LAB — HEPARIN LEVEL (UNFRACTIONATED): Heparin Unfractionated: <0.1 IU/mL — ABNORMAL LOW (ref 0.30–0.70)

## 2020-01-19 MED ORDER — DOCUSATE SODIUM 50 MG/5ML PO LIQD: 100.0000 mg | Freq: Two times a day (BID) | ORAL | Status: DC | PRN

## 2020-01-19 MED ORDER — BISACODYL 10 MG RE SUPP: 10.0000 mg | Freq: Every day | RECTAL | Status: DC | PRN

## 2020-01-19 MED ORDER — POLYETHYLENE GLYCOL 3350 17 G PO PACK: 17.0000 g | PACK | Freq: Every day | ORAL | Status: DC | PRN

## 2020-01-19 MED ORDER — BISACODYL 5 MG PO TBEC: 10.0000 mg | DELAYED_RELEASE_TABLET | Freq: Every day | ORAL | Status: DC | PRN

## 2020-01-19 MED ORDER — NYSTATIN 100000 UNIT/ML MT SUSP
5.0000 mL | Freq: Four times a day (QID) | OROMUCOSAL | Status: DC
  Administered 2020-01-19 – 2020-01-27 (×32): 500000 [IU] via ORAL
  Filled 2020-01-19 (×29): qty 5

## 2020-01-19 MED ORDER — AMIODARONE IV BOLUS ONLY 150 MG/100ML
150.0000 mg | Freq: Once | INTRAVENOUS | Status: AC
  Administered 2020-01-19: 10:00:00 150 mg via INTRAVENOUS

## 2020-01-19 NOTE — Progress Notes (Signed)
EVENING ROUNDS NOTE :     [address]       [place] [address]             [phone]                 6 Days Post-Op Procedure(s) (LRB): RE-EXPLORATION POST OPERATIVE OPEN HEART (N/A) TRANSESOPHAGEAL ECHOCARDIOGRAM (TEE) (N/A)   Total Length of Stay:  LOS: 20 days  Events:   No events 3L off today    BP 90/67   Pulse (!) 101   Temp (!) 97.52 F (36.4 C)   Resp (!) 28   Ht 6\' 3"  (1.905 m)   Wt 115.2 kg   SpO2 98%   BMI 31.74 kg/m   PAP: (26-40)/(13-25) 30/22 CVP:  [6 mmHg-17 mmHg] 13 mmHg CO:  [4.8 L/min-5.8 L/min] 5.8 L/min CI:  [2.1 L/min/m2-2.5 L/min/m2] 2.5 L/min/m2  Vent Mode: PRVC FiO2 (%):  [40 %-50 %] 40 % Set Rate:  [24 bmp-28 bmp] 28 bmp Vt Set:  [510 mL] 510 mL PEEP:  [5 cmH20] 5 cmH20 Plateau Pressure:  [27 cmH20-34 cmH20] 27 cmH20  .  prismasol BGK 4/2.5 500 mL/hr at 01/19/20 1523  .  prismasol BGK 4/2.5 200 mL/hr at 01/19/20 1602  . sodium chloride Stopped (01/19/2020 1646)  . sodium chloride 10 mL/hr at 01/14/20 2101  . sodium chloride Stopped (01/16/20 1032)  . amiodarone 60 mg/hr (01/19/20 1700)  . epinephrine 6 mcg/min (01/19/20 1700)  . fentaNYL infusion INTRAVENOUS 150 mcg/hr (01/19/20 1700)  . impella catheter heparin 50 unit/mL in dextrose 5%    . insulin 6.5 mL/hr at 01/19/20 1700  . lactated ringers 20 mL/hr at 01/19/20 1700  . meropenem (MERREM) IV Stopped (01/19/20 1336)  . midazolam 4 mg/hr (01/19/20 1700)  . milrinone 0.125 mcg/kg/min (01/19/20 1700)  . norepinephrine (LEVOPHED) Adult infusion 26 mcg/min (01/19/20 1700)  . prismasol BGK 4/2.5 1,500 mL/hr at 01/19/20 1637  . vancomycin 166.7 mL/hr at 01/19/20 1700  . vasopressin 0.04 Units/min (01/19/20 1700)    I/O last 3 completed shifts: In: 4235 [I.V.:4128.3; Other:311.3; NG/GT:2090; IV Piggyback:1214.3] Out: 36144 [Urine:186; RXVQM:08676; Chest Tube:240]   CBC Latest Ref Rng & Units 01/19/2020 01/19/2020 01/19/2020  WBC 4.0 - 10.5 K/uL - - -   Hemoglobin 13.0 - 17.0 g/dL 10.2(L) 10.5(L) 9.5(L)  Hematocrit 39.0 - 52.0 % 30.0(L) 31.0(L) 28.0(L)  Platelets 150 - 400 K/uL - - -    BMP Latest Ref Rng & Units 01/19/2020 01/19/2020 01/19/2020  Glucose 70 - 99 mg/dL - 156(H) -  BUN 6 - 20 mg/dL - 32(H) -  Creatinine 0.61 - 1.24 mg/dL - 1.91(H) -  BUN/Creat Ratio 9 - 20 - - -  Sodium 135 - 145 mmol/L 137 135 137  Potassium 3.5 - 5.1 mmol/L 4.2 4.1 4.2  Chloride 98 - 111 mmol/L - 103 -  CO2 22 - 32 mmol/L - 22 -  Calcium 8.9 - 10.3 mg/dL - 7.9(L) -    ABG    Component Value Date/Time   PHART 7.339 (L) 01/19/2020 1652   PCO2ART 46.9 01/19/2020 1652   PO2ART 84 01/19/2020 1652   HCO3 25.4 01/19/2020 1652   TCO2 27 01/19/2020 1652   ACIDBASEDEF 1.0 01/19/2020 1652   O2SAT 96.0 01/19/2020 1652       Melodie Bouillon, MD 01/19/2020 5:41 PM

## 2020-01-19 NOTE — Progress Notes (Addendum)
Patient ID: Corey Palmer, male   DOB: 1970-06-07, 49 y.o.   MRN: [id number]     Advanced Heart Failure Rounding Note  PCP-Cardiologist: No primary care provider on file.   Subjective:    - 12/7 Torsades -ICD shock x1.  - 12/11 Impella 5.5 placed.  - 12/12 Impella repositioned in OR - 12/15 CABG x 4 with LIMA-LAD, SVG-PDA/PLV, SVG-OM - 12/16 DCCV afib.  Back to OR to re-open chest and again to evacuate hematoma.  - 12/19 CVVH initiated   Remains intubated/sedated. Chest open. Impella at P-8. On NE 25. Epi 4, VP 0.04, milrinone 0.125 and iNO 21.    Remains oliguric, 75 cc in UOP yesterday. Continues on CVVH for volume removal. Pull rate reduced yesterday, now pulling 100-200/hr. Additional 9 L removed yesterday. Wt charted same as yesterday. Will ask RN to re-weigh.  WBC remains elevated, 19>>16>>20>>19K. Temp range 97.5 - 99.3. Continues on meropenem and vancomycin w/ open chest.    Swan numbers: CVP 15-16 PA 37/21 (26) Co/CI 4.79/2.08 Co-ox 79% PAPi 1.0   Impella 5.5 P-8 Flow 4.4 Heparin only in purge 1 suction event overnight   Echo (12/10): EF <20%, moderate LV dilation, normal RV size with mildly decreased systolic function.    Objective:   Weight Range: 116.8 kg Body mass index is 32.18 kg/m.   Vital Signs:   Temp:  [97.52 F (36.4 C)-99.3 F (37.4 C)] 97.7 F (36.5 C) (12/22 0700) Pulse Rate:  [44-126] 74 (12/22 0700) Resp:  [21-28] 27 (12/22 0700) BP: (67-93)/(55-68) 90/67 (12/22 0418) SpO2:  [89 %-100 %] 100 % (12/22 0700) Arterial Line BP: (65-93)/(50-67) 79/59 (12/22 0700) FiO2 (%):  [40 %] 40 % (12/22 0418) Weight:  [116.8 kg] 116.8 kg (12/22 0530) Last BM Date: 01/18/20  Weight change: Filed Weights   01/17/20 0515 01/18/20 0500 01/19/20 0530  Weight: 121.6 kg 117 kg 116.8 kg    Intake/Output:   Intake/Output Summary (Last 24 hours) at 01/19/2020 0747 Last data filed at 01/19/2020 0700 Gross per 24 hour  Intake 5334.83 ml  Output 9127  ml  Net -3792.17 ml      Physical Exam   CVP 16 General:  Intubated and sedated, open chest  HEENT: normal + ETT, + cor-trak  Neck: supple.  JVD 16 cm. Carotids 2+ bilat; no bruits. No lymphadenopathy or thyromegaly appreciated. +rt axillary impella  Cor: open chest. PMI nondisplaced. Regular rate & rhythm. No rubs, gallops or murmurs. Lungs: intubated and course Abdomen: obese, oft, nontender, nondistended. No hepatosplenomegaly. No bruits or masses. Good bowel sounds. Extremities: no cyanosis, clubbing, rash, 2-3+ bilateral LEE up to thigh  Neuro: intubated and sedated     Telemetry    Afib 110s  Personally reviewed   Labs    CBC Recent Labs    01/18/20 0430 01/19/20 0338 01/19/20 0444  WBC 19.8* 18.9*  --   NEUTROABS 15.3* 14.5*  --   HGB 9.0* 9.0* 9.5*  HCT 28.8* 28.1* 28.0*  MCV 92.0 92.4  --   PLT 231 279  --    Basic Metabolic Panel Recent Labs    01/18/20 0430 01/18/20 1512 01/19/20 0338 01/19/20 0444  NA 136 136 135 136  K 3.9 4.5 4.0 4.1  CL 104 102 103  --   CO2 '24 24 23  ' --   GLUCOSE 158* 123* 127*  --   BUN 28* 27* 29*  --   CREATININE 1.75* 1.73* 1.87*  --   CALCIUM 8.2* 8.2*  8.1*  --   MG 2.3  --  2.4  --   PHOS 2.8 2.9 2.8  --    Liver Function Tests Recent Labs    01/18/20 0430 01/18/20 1512 01/19/20 0338  AST 86*  --  59*  ALT 183*  --  131*  ALKPHOS 177*  --  182*  BILITOT 1.4*  --  1.0  PROT 6.0*  --  6.0*  ALBUMIN 2.2* 2.3* 2.2*   No results for input(s): LIPASE, AMYLASE in the last 72 hours. Cardiac Enzymes No results for input(s): CKTOTAL, CKMB, CKMBINDEX, TROPONINI in the last 72 hours.  BNP: BNP (last 3 results) Recent Labs    01/14/2020 1619  BNP 577.5*    ProBNP (last 3 results) No results for input(s): PROBNP in the last 8760 hours.   D-Dimer No results for input(s): DDIMER in the last 72 hours. Hemoglobin A1C No results for input(s): HGBA1C in the last 72 hours. Fasting Lipid Panel No results for  input(s): CHOL, HDL, LDLCALC, TRIG, CHOLHDL, LDLDIRECT in the last 72 hours. Thyroid Function Tests No results for input(s): TSH, T4TOTAL, T3FREE, THYROIDAB in the last 72 hours.  Invalid input(s): FREET3  Other results:   Imaging    ECHOCARDIOGRAM LIMITED  Result Date: 01/18/2020    ECHOCARDIOGRAM LIMITED REPORT   [name] Date of Exam: 01/18/2020 Medical Rec #:  [id number]     Height:       75.0 in Accession #:    [id number]    Weight:       257.9 lb Date of Birth:  Feb 13, 1970     BSA:          2.445 m Patient Age:    60 years      BP:           93/67 mmHg Patient Gender: M             HR:           115 bpm. Exam Location:  Inpatient Procedure: Limited Echo Indications:    CHF, Impella position  History:        Patient has prior history of Echocardiogram examinations, most                 recent 01/14/2020. CHF, CAD, Prior CABG and Defibrillator;                 Arrythmias:Atrial Fibrillation. Impella, Cardiogenic shock.  Sonographer:    Dustin Flock Referring Phys: [id number] Wilmer Floor SIMMONS  Sonographer Comments: Impella positioning. IMPRESSIONS  1. Limited echo for Impella positioning. Initially the device was noted to be too far advanced in the LV, measuring 6.65 cm distal to the aortic valve near the posteromedial papillary muscle. It was repositioned to a final measurement of 5.5 cm distal to the aortic valve in the 2 chamber view (ideal position 3.5 cm). Left ventricular ejection fraction, by estimation, is <20%. The left ventricle has severely decreased function. The left ventricle demonstrates global hypokinesis. Comparison(s): Changes from prior study are noted. 01/14/2020: LVEF <20%, impella position 4.6 cm distal to the aortic cusps. FINDINGS  Left Ventricle: Limited echo for Impella positioning. Initially the device was noted to be too far advanced in the LV, measuring 6.65 cm distal to the aortic valve near the posteromedial papillary muscle. It was repositioned  to a final measurement of 5.5 cm distal to the aortic valve in the 2 chamber view (ideal position 3.5 cm). Left  ventricular ejection fraction, by estimation, is <20%. The left ventricle has severely decreased function. The left ventricle demonstrates global hypokinesis. Lyman Bishop MD Electronically signed by Lyman Bishop MD Signature Date/Time: 01/18/2020/10:47:59 AM    Final      Medications:     Scheduled Medications: . aspirin  324 mg Per Tube Daily  . atorvastatin  80 mg Per Tube Daily  . B-complex with vitamin C  1 tablet Per Tube Daily  . bisacodyl  10 mg Oral Daily   Or  . bisacodyl  10 mg Rectal Daily  . busPIRone  10 mg Per Tube TID  . chlorhexidine gluconate (MEDLINE KIT)  15 mL Mouth Rinse BID  . Chlorhexidine Gluconate Cloth  6 each Topical Daily  . docusate  100 mg Per Tube BID  . feeding supplement (PIVOT 1.5 CAL)  1,000 mL Per Tube Q24H  . fentaNYL (SUBLIMAZE) injection  25 mcg Intravenous Once  . FLUoxetine  40 mg Per Tube BID  . lipase/protease/amylase)  20,880 Units Per Tube Once   And  . sodium bicarbonate  650 mg Per Tube Once  . mouth rinse  15 mL Mouth Rinse 10 times per day  . pantoprazole sodium  40 mg Per Tube Daily  . polyethylene glycol  17 g Per Tube Daily  . sodium chloride flush  10-40 mL Intracatheter Q12H  . sodium chloride flush  3 mL Intravenous Q12H  . valproic acid  250 mg Per Tube BID    Infusions: .  prismasol BGK 4/2.5 500 mL/hr at 01/19/20 0315  .  prismasol BGK 4/2.5 200 mL/hr at 01/18/20 0815  . sodium chloride Stopped (12/31/2019 1646)  . sodium chloride 10 mL/hr at 01/14/20 2101  . sodium chloride Stopped (01/16/20 1032)  . amiodarone 30 mg/hr (01/19/20 0700)  . epinephrine 4 mcg/min (01/19/20 0700)  . fentaNYL infusion INTRAVENOUS 150 mcg/hr (01/19/20 0700)  . impella catheter heparin 50 unit/mL in dextrose 5%    . insulin 4.6 mL/hr at 01/19/20 0700  . lactated ringers 20 mL/hr at 01/19/20 0700  . meropenem (MERREM) IV Stopped  (01/19/20 0606)  . midazolam 4 mg/hr (01/19/20 0700)  . milrinone 0.125 mcg/kg/min (01/19/20 0700)  . norepinephrine (LEVOPHED) Adult infusion 22 mcg/min (01/19/20 0700)  . prismasol BGK 4/2.5 1,500 mL/hr at 01/19/20 0437  . vancomycin    . vasopressin 0.04 Units/min (01/19/20 0700)    PRN Medications: sodium chloride, sodium chloride, dextrose, fentaNYL, fentaNYL (SUBLIMAZE) injection, fentaNYL (SUBLIMAZE) injection, heparin, heparin, metoprolol tartrate, midazolam, midazolam, midazolam, ondansetron (ZOFRAN) IV, oxyCODONE, sodium chloride flush, sodium chloride flush     Assessment/Plan   1. Cardiogenic shock/acute on chronic systolic CHF: Initially nonischemic cardiomyopathy. Possible familial cardiomyopathy as both parents had cardiomyopathy and died at around 72.However, [employer] gene testing did not show any common mutation for cardiomyopathy.  However, this admission noted to have severe 3 vessel disease so suspect component of ischemic cardiomyopathy. [employer] Jude ICD. Echo in 8/20 with EF 15% and mildly decreased RV function. Echo this admission with EF < 20%, moderate LV dilation, RV mildly reduced, severe LAE, no significant MR.Low output HF with markedly low EF.  He has a long history of cardiomyopathy (20 yrs), tends to minimize symptoms.  Cardiorenal syndrome with creatinine up to 2.2,  stabilized on milrinone and Impella 5.5. SCr 1.44 day of CABG. CABG 12/15.  Post-op shock, back to OR 12/16 to open chest (chest wall compartment syndrome) and again to evacuate hematoma.  TEE post-op with severe RV  dysfunction.  CVVHD initiated 12/19 for volume removal in setting of rising Scr/decreased UOP. Suspect ATN  - Continue aggressive CVVHD for fluid removal to permit chest closure  - Need to keep renal perfusion high. Continue Impella at P-8. Will not wean until chest is closed. - Increase NE as needed to keep MAPs > 70 consistently and closer to 80-90 - LDH 412 -> 468 -> 311->339->408->479.    - Continue EPI, NE, VP and Milrinone  2. Atrial fibrillation: H/o PAF.  He was on dofetilide in the remote past but this was stopped due to noncompliance. He had an upper GI bleed from antral ulcers in 3/12. He was seen by GI and was deemed safe to restart anticoagulation as long as he remains on a PPI.  No apparent recurrence of AF until just prior to this admission, was cardioverted back to NSR in ER but went back into atrial fibrillation.  Post-op afib, DCCV to NSR/BiV pacing am 12/16, now back in atrial fibrillation Remains in AF - Continue amiodarone at 30 mg/hr. - Would not try to cardiovert again until back on heparin gtt and vasoactive meds weaned.  - Off heparin post-op with bleeding, only getting heparin in Impella purge currently.  Restart systemic heparin gtt when surgery thinks safe to do so. No change - Unable to do Maze with CABG.    3. CAD:   Cath this admission with severe 3 vessel disease, ideally treated by CABG given extent of disease. CABG x 4 on 12/15.  - Continue atorvastatin, ASA.  - continue current management 4. Acute on chronic hypoxemic respiratory failure: Pulmonary edema, vent per CCM.  - Oxygenation worse in setting of worsening volume overload. Continue CVVHD. - CCM following 5. AKI on CKD stage ?3: Likely ATN/cardiorenal - SCr 1.74>>1.85>>2.02>>iniation of CVVH>>1.78>>1.74->1.75->1.87 - oliguric, only 75 cc in UOP yesterday  - Needs fluid off to improve oxygenation and to permit chest closure.  - Continue CVVHD per Renal. Appreciate their help.  6. Diabetes: Insulin.  7. Hyponatremia: Resolved.  8. Torsades/ICD shock: 12/7/212 in hospital event, in setting of severe hypokalemia and hypomagnesemia.   9. Gout: h/o gout. Complained of rt knee pain c/w previous flares - treated w/ prednisone burst (completed).  10: ID: Tm 99.3 PCT 13.46 -> 8.65  Cultures NGTD.  - Covering with vancomycin/meropenem (PCN allergy).      Lyda Jester [facility]   01/19/2020 7:47 AM  Agree with above. Chest remains open. Patient on multiple pressors and impella at P-8. Trying to pull fluid with CVVHD but having to increase pressors. We reweeghed patient personally and weight down 4 pounds over night (115 kg).   General:  Sedated on vent HEENT: normal + ETT Neck: supple. RIJ swan  Carotids 2+ bilat; no bruits. No lymphadenopathy or thryomegaly appreciated. Cor: Chest open. Irregular. +CTs Lungs: coarse Abdomen: obese soft, nontender, nondistended. Extremities: no cyanosis, clubbing, rash, 2+ edema RFV vas cath Neuro:intubated sedated  Remains critically ill. Bedside echo dose and LVEF 10-15% RV severely down (PAPi 0.6). No evidence of tamponade. Volume status improving but still about 20-25 pounds above baseline weight.   Will increase epi and try to pull 150-200cc/hour on CVVHD to permit chest closure tomorrow. D/w Dr. Prescott Gum. Impella viewed unded echo and in good position. Waveforms stable. Lack of pulsativity due to poor intrinsic cardiac function.   CRITICAL CARE Performed by: Glori Bickers  Total critical care time: 45 minutes  Critical care time was exclusive of separately billable procedures and treating  other patients.  Critical care was necessary to treat or prevent imminent or life-threatening deterioration.  Critical care was time spent personally by me (independent of midlevel providers or residents) on the following activities: development of treatment plan with patient and/or surrogate as well as nursing, discussions with consultants, evaluation of patient's response to treatment, examination of patient, obtaining history from patient or surrogate, ordering and performing treatments and interventions, ordering and review of laboratory studies, ordering and review of radiographic studies, pulse oximetry and re-evaluation of patient's condition.  Glori Bickers, MD  1:44 PM

## 2020-01-19 NOTE — Progress Notes (Signed)
Corey Palmer for heparin Indication: Impella 5.5  Allergies  Allergen Reactions  . Orange Fruit Anaphylaxis, Hives and Other (See Comments)    Per Pt- Blisters around lips and Hives all over, also  . Penicillins Anaphylaxis    Did it involve swelling of the face/tongue/throat, SOB, or low BP? Yes Did it involve sudden or severe rash/hives, skin peeling, or any reaction on the inside of your mouth or nose? Yes Did you need to seek medical attention at a hospital or doctor's office? No When did it last happen?childhood If all above answers are "NO", may proceed with cephalosporin use.  Corey Palmer [Empagliflozin] Itching  . Basaglar Kwikpen [Insulin Glargine] Nausea And Vomiting    Patient Measurements: Height: 6\' 3"  (190.5 cm) Weight: 116.8 kg (257 lb 8 oz) IBW/kg (Calculated) : 84.5 Heparin dosing wt: 101 kg  Vital Signs: Temp: 97.7 F (36.5 C) (12/22 0700) Temp Source: Core (12/22 0400) BP: 90/67 (12/22 0418) Pulse Rate: 74 (12/22 0700)  Labs: Recent Labs    01/17/20 0325 01/17/20 0326 01/17/20 1525 01/17/20 1532 01/18/20 0430 01/18/20 1512 01/19/20 0338 01/19/20 0444  HGB  --    < > 8.7*   < > 9.0*  --  9.0* 9.5*  HCT  --    < > 27.2*   < > 28.8*  --  28.1* 28.0*  PLT  --    < > 207  --  231  --  279  --   HEPARINUNFRC <0.10*  --   --   --  <0.10*  --  <0.10*  --   CREATININE  --    < > 1.66*  --  1.75* 1.73* 1.87*  --    < > = values in this interval not displayed.    Estimated Creatinine Clearance: 64.4 mL/min (A) (by C-G formula based on SCr of 1.87 mg/dL (H)).   Assessment: 49 yo male s/p impella 5.5 placement. Pt started on heparinized purge and systemic heparin. Pt now s/p CABG 12/15 with Impella remaining in place c/b significant bleeding and hematoma requiring opened chest and reexploration in OR 12/16.  Impella on P8 with stable pressures. Heparin running via purge only at this time, purge flow rate 9.1 ml/hr  (approx 455 units/hr heparin). Heparin level undetectable as expected, CBC stable this AM. No overt bleeding per discussion with nursing.  Goal of Therapy:  Heparin level 0.2-0.5 units/ml Monitor platelets by anticoagulation protocol: Yes   Plan:  Continue heparin via the purge. Will follow with CVTS regarding systemic heparin once appropriate to initiate Daily heparin level for now  [name], Corey Palmer, [facility] Clinical Pharmacist  01/19/2020 8:09 AM   [facility] pharmacy phone numbers are listed on [email or web address]  Please check [email or web address] for unit-specific pharmacy phone numbers.

## 2020-01-19 NOTE — Progress Notes (Signed)
Patient ID: Corey Palmer, male   DOB: 02/11/1970, 49 y.o.   MRN: [id number] S: Currently off of CRRT due to issues with blood leak sensors.  Awaiting new filter.  Able to UF 1.8 liters overnight but had a further drop in UOP. O:BP 90/67   Pulse 74   Temp 97.7 F (36.5 C)   Resp (!) 27   Ht '6\' 3"'  (1.905 m)   Wt 116.8 kg   SpO2 100%   BMI 32.18 kg/m   Intake/Output Summary (Last 24 hours) at 01/19/2020 0854 Last data filed at 01/19/2020 0700 Gross per 24 hour  Intake 5136.41 ml  Output 8659 ml  Net -3522.59 ml   Intake/Output: I/O last 3 completed shifts: In: 6270 [I.V.:4128.3; Other:311.3; NG/GT:2090; IV Piggyback:1214.3] Out: 35009 [Urine:186; FGHWE:99371; Chest Tube:240]  Intake/Output this shift:  No intake/output data recorded. Weight change: -0.2 kg Gen: intubated and sedated CVS: RRR, open sternal wound Resp: cta Abd: +BS, soft, NT/ND Ext: 1+ anasarca to gravity dependent areas  Recent Labs  Lab 01/21/2020 1022 01/11/2020 1026 01/14/20 1700 01/14/20 1722 01/15/20 1733 01/15/20 1741 01/16/20 0349 01/16/20 0427 01/16/20 1634 01/16/20 1636 01/17/20 0324 01/17/20 0326 01/17/20 1525 01/17/20 1532 01/18/20 0359 01/18/20 0430 01/18/20 1512 01/19/20 0338 01/19/20 0444  NA  --    < > 136   < > 137   < > 138   < > 137   < >  --  137 136 139 138 136 136 135 136  K  --    < > 3.9   < > 3.6   < > 3.6   < > 3.2*   < >  --  3.7 3.8 3.9 3.9 3.9 4.5 4.0 4.1  CL  --    < > 104   < > 104  --  104  --  104  --   --  101 102  --   --  104 102 103  --   CO2  --    < > 23   < > 24  --  24  --  24  --   --  24 25  --   --  '24 24 23  ' --   GLUCOSE  --    < > 121*   < > 153*  --  161*  --  182*  --   --  160* 149*  --   --  158* 123* 127*  --   BUN  --    < > 27*   < > 32*  --  36*  --  36*  --   --  33* 30*  --   --  28* 27* 29*  --   CREATININE  --    < > 1.70*   < > 1.85*  --  2.02*  --  1.78*  --   --  1.74* 1.66*  --   --  1.75* 1.73* 1.87*  --   ALBUMIN 2.4*  --  2.3*  --   --    --  2.0*  --  2.0*  --  2.1* 2.0* 2.1*  --   --  2.2* 2.3* 2.2*  --   CALCIUM  --    < > 8.2*   < > 8.1*  --  8.0*  --  7.9*  --   --  8.0* 7.8*  --   --  8.2* 8.2* 8.1*  --   PHOS  --   --  4.3   < > 3.7  --   --   --  3.3  --   --  3.1 2.7  --   --  2.8 2.9 2.8  --   AST 173*  --  264*  --   --   --  350*  --   --   --  172*  --   --   --   --  86*  --  59*  --   ALT 85*  --  171*  --   --   --  335*  --   --   --  262*  --   --   --   --  183*  --  131*  --    < > = values in this interval not displayed.   Liver Function Tests: Recent Labs  Lab 01/17/20 0324 01/17/20 0326 01/18/20 0430 01/18/20 1512 01/19/20 0338  AST 172*  --  86*  --  59*  ALT 262*  --  183*  --  131*  ALKPHOS 168*  --  177*  --  182*  BILITOT 1.3*  --  1.4*  --  1.0  PROT 5.5*  --  6.0*  --  6.0*  ALBUMIN 2.1*   < > 2.2* 2.3* 2.2*   < > = values in this interval not displayed.   No results for input(s): LIPASE, AMYLASE in the last 168 hours. No results for input(s): AMMONIA in the last 168 hours. CBC: Recent Labs  Lab 01/17/20 0324 01/17/20 0806 01/17/20 1525 01/17/20 1532 01/18/20 0430 01/19/20 0338 01/19/20 0444  WBC 19.1* 19.8* 16.8*  --  19.8* 18.9*  --   NEUTROABS  --   --   --   --  15.3* 14.5*  --   HGB 8.9* 9.1* 8.7*   < > 9.0* 9.0* 9.5*  HCT 27.6* 28.6* 27.2*   < > 28.8* 28.1* 28.0*  MCV 89.0 90.5 90.7  --  92.0 92.4  --   PLT 207 222 207  --  231 279  --    < > = values in this interval not displayed.   Cardiac Enzymes: No results for input(s): CKTOTAL, CKMB, CKMBINDEX, TROPONINI in the last 168 hours. CBG: Recent Labs  Lab 01/18/20 1729 01/18/20 1759 01/18/20 1857 01/19/20 0623 01/19/20 0806  GLUCAP 153* 144* 132* 136* 262*    Iron Studies: No results for input(s): IRON, TIBC, TRANSFERRIN, FERRITIN in the last 72 hours. Studies/Results: DG Chest Port 1 View  Result Date: 01/18/2020 CLINICAL DATA:  Intubation.  Chest tube. EXAM: PORTABLE CHEST 1 VIEW COMPARISON:   01/17/2020. FINDINGS: Endotracheal tube, feeding tube, Swan-Ganz catheter, mediastinal drainage catheters, bilateral chest tubes in stable position. Impella device in stable position. Cardiac pacer in stable position. Stable severe cardiomegaly. Diffuse bilateral interstitial infiltrates/edema again noted without interim change. Persistent bibasilar atelectasis. Tiny left pleural effusion cannot be excluded. No prominent pleural effusion noted. No pneumothorax. Surgical clips right axilla. IMPRESSION: 1. Lines and tubes including bilateral chest tubes in stable position. No pneumothorax. 2. Cardiac pacer and Impella device in stable position. Stable severe cardiomegaly. 3. Diffuse bilateral interstitial infiltrates/edema again noted without interim change. Persistent bibasilar atelectasis. Tiny left pleural effusion cannot be excluded. Electronically Signed   By: Marcello Moores  Register   On: 01/18/2020 06:56   ECHOCARDIOGRAM LIMITED  Result Date: 01/18/2020    ECHOCARDIOGRAM LIMITED REPORT   [name] Date of Exam: 01/18/2020 Medical  Rec #:  [id number]     Height:       75.0 in Accession #:    [id number]    Weight:       257.9 lb Date of Birth:  09/13/1970     BSA:          2.445 m Patient Age:    49 years      BP:           93/67 mmHg Patient Gender: M             HR:           115 bpm. Exam Location:  Inpatient Procedure: Limited Echo Indications:    CHF, Impella position  History:        Patient has prior history of Echocardiogram examinations, most                 recent 01/14/2020. CHF, CAD, Prior CABG and Defibrillator;                 Arrythmias:Atrial Fibrillation. Impella, Cardiogenic shock.  Sonographer:    Dustin Flock Referring Phys: [phone] Wilmer Floor SIMMONS  Sonographer Comments: Impella positioning. IMPRESSIONS  1. Limited echo for Impella positioning. Initially the device was noted to be too far advanced in the LV, measuring 6.65 cm distal to the aortic valve near the  posteromedial papillary muscle. It was repositioned to a final measurement of 5.5 cm distal to the aortic valve in the 2 chamber view (ideal position 3.5 cm). Left ventricular ejection fraction, by estimation, is <20%. The left ventricle has severely decreased function. The left ventricle demonstrates global hypokinesis. Comparison(s): Changes from prior study are noted. 01/14/2020: LVEF <20%, impella position 4.6 cm distal to the aortic cusps. FINDINGS  Left Ventricle: Limited echo for Impella positioning. Initially the device was noted to be too far advanced in the LV, measuring 6.65 cm distal to the aortic valve near the posteromedial papillary muscle. It was repositioned to a final measurement of 5.5 cm distal to the aortic valve in the 2 chamber view (ideal position 3.5 cm). Left ventricular ejection fraction, by estimation, is <20%. The left ventricle has severely decreased function. The left ventricle demonstrates global hypokinesis. Lyman Bishop MD Electronically signed by Lyman Bishop MD Signature Date/Time: 01/18/2020/10:47:59 AM    Final    . aspirin  324 mg Per Tube Daily  . atorvastatin  80 mg Per Tube Daily  . B-complex with vitamin C  1 tablet Per Tube Daily  . bisacodyl  10 mg Oral Daily   Or  . bisacodyl  10 mg Rectal Daily  . busPIRone  10 mg Per Tube TID  . chlorhexidine gluconate (MEDLINE KIT)  15 mL Mouth Rinse BID  . Chlorhexidine Gluconate Cloth  6 each Topical Daily  . docusate  100 mg Per Tube BID  . feeding supplement (PIVOT 1.5 CAL)  1,000 mL Per Tube Q24H  . fentaNYL (SUBLIMAZE) injection  25 mcg Intravenous Once  . FLUoxetine  40 mg Per Tube BID  . lipase/protease/amylase)  20,880 Units Per Tube Once   And  . sodium bicarbonate  650 mg Per Tube Once  . mouth rinse  15 mL Mouth Rinse 10 times per day  . nystatin  5 mL Oral QID  . pantoprazole sodium  40 mg Per Tube Daily  . polyethylene glycol  17 g Per Tube Daily  . sodium chloride flush  10-40 mL Intracatheter  Q12H  . sodium chloride  flush  3 mL Intravenous Q12H  . valproic acid  250 mg Per Tube BID    BMET    Component Value Date/Time   NA 136 01/19/2020 0444   NA 138 09/08/2018 0924   K 4.1 01/19/2020 0444   CL 103 01/19/2020 0338   CO2 23 01/19/2020 0338   GLUCOSE 127 (H) 01/19/2020 0338   BUN 29 (H) 01/19/2020 0338   BUN 23 09/08/2018 0924   CREATININE 1.87 (H) 01/19/2020 0338   CREATININE 1.13 05/13/2013 1533   CALCIUM 8.1 (L) 01/19/2020 0338   GFRNONAA 43 (L) 01/19/2020 0338   GFRNONAA 79 05/13/2013 1533   GFRAA >60 03/08/2019 1515   GFRAA >89 05/13/2013 1533   CBC    Component Value Date/Time   WBC 18.9 (H) 01/19/2020 0338   RBC 3.04 (L) 01/19/2020 0338   HGB 9.5 (L) 01/19/2020 0444   HGB 16.2 03/23/2018 1535   HCT 28.0 (L) 01/19/2020 0444   HCT 47.0 03/23/2018 1535   PLT 279 01/19/2020 0338   PLT 251 03/23/2018 1535   MCV 92.4 01/19/2020 0338   MCV 82 03/23/2018 1535   MCH 29.6 01/19/2020 0338   MCHC 32.0 01/19/2020 0338   RDW 20.1 (H) 01/19/2020 0338   RDW 15.0 03/23/2018 1535   LYMPHSABS 1.1 01/19/2020 0338   LYMPHSABS 2.5 03/23/2018 1535   MONOABS 1.9 (H) 01/19/2020 0338   EOSABS 0.5 01/19/2020 0338   EOSABS 0.2 03/23/2018 1535   BASOSABS 0.2 (H) 01/19/2020 0338   BASOSABS 0.0 03/23/2018 1535     Assessment/Plan:  1. AKI/CKD stage III- presumably due to ischemic ATN in setting of cardiogenic shock s/p CABG. Initially with good response with lasix drip, however starting to have decrease in UOP following marked UF with CRRT overnight.  1. CRRT started 01/16/20 2. All fluids 4K/2.5Ca: Pre-filter 500 ml/hr, post filter 200 ml/hr, dialysate 1500 ml/hr 3. UF goal 100-200 however still with significant edema and no UOP.  Will increase goal to 200-300 but keep MAP >65 4. No heparin due to hematoma 5. Off lasix drip 01/17/20 due to oliguria and UF with CRRT 6. Right femoral HD catheter placed 01/16/20 7. Will resume CRRT once new system set up. 2. CAD s/p  CABG complicated by cardiogenic shock and post-op hematoma. Currently with an open chest and awaiting improvement of volume status for secondary closure 3. Cardiogenic shock- underlying familial cardiomyopathy s/p redo CABG. Remains on pressors: Levophed, epinephrine, and vasopressin. Impella and milrinone.  1. Tolerating UF with CRRT and goal is 100-200 ml/hr.   2. Down 17 kg from peak volume but still above goal edw for chest closure. 3. Cont with UF of 200 ml/hr unless he requires more pressor support, then decrease to 100 ml/hr. 4. ABLA- s/p postoperative bleeding into chest cavity s/p re-exploration for tamponade/hematoma. CT surgery following.  5. Hypophosphatemia- due to CRRT.  Repleted with K phos yesterday and follow.  6. Atrial fibrillation - s/p DCCV on 12/16 7. VDRF- per primary svc 8. ID - rising WBC. On vancomycin and meropenem.   Donetta Potts, MD [employer] [phone]

## 2020-01-19 NOTE — Progress Notes (Signed)
NAME:  Corey Palmer, MRN:  [id number], DOB:  04-20-1970, LOS: 33 ADMISSION DATE:  01/27/2020, CONSULTATION DATE: 01/12/2020 REFERRING MD: Aundra Dubin, CHIEF COMPLAINT: Respiratory failure post Impella  HPI/course in hospital  49 year old man underwent Impella 5.5 insertion 12/11 for persistent symptoms of forward failure with low cardiac indices.  He presented last week for ICD shocks and was found to be in rapid atrial fibrillation for which he was cardioverted.  He was also found to be in decompensated heart failure with an ejection fraction of 15% and multivessel coronary disease on left heart catheterization.  He was started on tropes and diuresed which improved his congestive symptoms but his index remained low so an Impella was placed to optimize his hemodynamics prior to a high risk redo-bypass.  Uneventful Impella implantation.  Patient returned from operating room intubated.  He had to go back to the OR for displacement of Impella 12/12.  Underwent CABGx4 on 12/15.  Returned to ICU on significant inotropic support and intubated.    Interim history/subjective:  12/22: issues with crrt overnight. Resuming today. ldh up somewhat. Remains on iNO and vasopressors/inotropic support per HF. Vent settings remain low.  12/21: started on versed gtt at this time. Off lasix infusion.  12/20: remains on great amount of support via impella and gtt. Also on NO at this time 21ppm. Pt only on 199mcg of fentanyl will add some low dose for amnestic effect.     Objective   Blood pressure 90/67, pulse (!) 126, temperature 98.6 F (37 C), resp. rate (!) 24, height 6\' 3"  (1.905 m), weight 116.8 kg, SpO2 100 %. PAP: (26-42)/(13-27) 33/23 CVP:  [6 mmHg-20 mmHg] 17 mmHg CO:  [4.8 L/min-5.2 L/min] 4.8 L/min CI:  [2.1 L/min/m2-2.2 L/min/m2] 2.1 L/min/m2  Vent Mode: PRVC FiO2 (%):  [40 %-50 %] 50 % Set Rate:  [24 bmp] 24 bmp Vt Set:  [510 mL] 510 mL PEEP:  [5 cmH20] 5 cmH20 Plateau Pressure:  [24  cmH20-34 cmH20] 34 cmH20   Intake/Output Summary (Last 24 hours) at 01/19/2020 1152 Last data filed at 01/19/2020 1100 Gross per 24 hour  Intake 5798.38 ml  Output 8344 ml  Net -2545.62 ml   Filed Weights   01/17/20 0515 01/18/20 0500 01/19/20 0530  Weight: 121.6 kg 117 kg 116.8 kg   PAP: (26-42)/(13-27) 33/23 CVP:  [6 mmHg-20 mmHg] 17 mmHg CO:  [4.8 L/min-5.2 L/min] 4.8 L/min CI:  [2.1 L/min/m2-2.2 L/min/m2] 2.1 L/min/m2  Examination: Physical exam: Exam is unchanged General: Ill-appearing man, intubated, sedated HEENT: ET tube in good position, OG tube in position, no oral lesions Neuro: Sedated, does not wake to voice this am Chest: Median sternotomy open, regular, Impella sounds audible, chest tubes in place, clear Abdomen: Nondistended, hypoactive bowel sounds Skin: Cool extremities, no rash  Ancillary tests (personally reviewed)  CBC: Recent Labs  Lab 01/17/20 0324 01/17/20 0806 01/17/20 1525 01/17/20 1532 01/18/20 0359 01/18/20 0430 01/19/20 0338 01/19/20 0444  WBC 19.1* 19.8* 16.8*  --   --  19.8* 18.9*  --   NEUTROABS  --   --   --   --   --  15.3* 14.5*  --   HGB 8.9* 9.1* 8.7* 8.8* 9.9* 9.0* 9.0* 9.5*  HCT 27.6* 28.6* 27.2* 26.0* 29.0* 28.8* 28.1* 28.0*  MCV 89.0 90.5 90.7  --   --  92.0 92.4  --   PLT 207 222 207  --   --  231 279  --     Basic Metabolic  Panel: Recent Labs  Lab 01/15/20 0100 01/15/20 0519 01/15/20 1733 01/15/20 1741 01/17/20 0324 01/17/20 0326 01/17/20 1525 01/17/20 1532 01/18/20 0359 01/18/20 0430 01/18/20 1512 01/19/20 0338 01/19/20 0444  NA 136   < > 137   < >  --  137 136   < > 138 136 136 135 136  K 3.5   < > 3.6   < >  --  3.7 3.8   < > 3.9 3.9 4.5 4.0 4.1  CL 103   < > 104   < >  --  101 102  --   --  104 102 103  --   CO2 22   < > 24   < >  --  24 25  --   --  24 24 23   --   GLUCOSE 142*   < > 153*   < >  --  160* 149*  --   --  158* 123* 127*  --   BUN 30*   < > 32*   < >  --  33* 30*  --   --  28* 27* 29*  --    CREATININE 1.65*   < > 1.85*   < >  --  1.74* 1.66*  --   --  1.75* 1.73* 1.87*  --   CALCIUM 8.2*   < > 8.1*   < >  --  8.0* 7.8*  --   --  8.2* 8.2* 8.1*  --   MG 1.8  --  1.6*  --  2.1  --   --   --   --  2.3  --  2.4  --   PHOS 4.1  --  3.7   < >  --  3.1 2.7  --   --  2.8 2.9 2.8  --    < > = values in this interval not displayed.   GFR: Estimated Creatinine Clearance: 64.4 mL/min (A) (by C-G formula based on SCr of 1.87 mg/dL (H)). Recent Labs  Lab 01/10/2020 1022 01/24/2020 1655 01/14/20 0406 01/14/20 1700 01/15/20 0100 01/15/20 0513 01/17/20 0325 01/17/20 0806 01/17/20 1525 01/18/20 0355 01/18/20 0430 01/18/20 1517 01/19/20 0338  PROCALCITON 15.15  --  13.46  --  8.65  --   --   --   --   --   --   --   --   WBC 16.7*   < > 17.0*   < > 19.0*   < >  --  19.8* 16.8*  --  19.8*  --  18.9*  LATICACIDVEN  --    < > 1.2   < >  --    < > 0.9  --  0.7 0.7  --  0.7  --    < > = values in this interval not displayed.    Liver Function Tests: Recent Labs  Lab 01/14/20 1700 01/16/20 0349 01/16/20 1634 01/17/20 0324 01/17/20 0326 01/17/20 1525 01/18/20 0430 01/18/20 1512 01/19/20 0338  AST 264* 350*  --  172*  --   --  86*  --  59*  ALT 171* 335*  --  262*  --   --  183*  --  131*  ALKPHOS 88 155*  --  168*  --   --  177*  --  182*  BILITOT 1.6* 1.6*  --  1.3*  --   --  1.4*  --  1.0  PROT 4.7* 5.0*  --  5.5*  --   --  6.0*  --  6.0*  ALBUMIN 2.3* 2.0*   < > 2.1* 2.0* 2.1* 2.2* 2.3* 2.2*   < > = values in this interval not displayed.   No results for input(s): LIPASE, AMYLASE in the last 168 hours. No results for input(s): AMMONIA in the last 168 hours.  ABG    Component Value Date/Time   PHART 7.367 01/19/2020 0444   PCO2ART 44.1 01/19/2020 0444   PO2ART 125 (H) 01/19/2020 0444   HCO3 25.4 01/19/2020 0444   TCO2 27 01/19/2020 0444   ACIDBASEDEF 2.0 01/14/2020 1722   O2SAT 69.8 01/19/2020 0806     Coagulation Profile: Recent Labs  Lab 12/30/2019 1518  01/11/2020 0340 01/13/20 1041  INR 1.7* 1.9* 1.9*    Cardiac Enzymes: No results for input(s): CKTOTAL, CKMB, CKMBINDEX, TROPONINI in the last 168 hours.  HbA1C: Hemoglobin A1C  Date/Time Value Ref Range Status  12/01/2019 04:04 PM 10.8 (A) 4.0 - 5.6 % Final  02/03/2019 04:24 PM 12.5 (A) 4.0 - 5.6 % Final   Hgb A1c MFr Bld  Date/Time Value Ref Range Status  01/03/2020 03:41 PM 10.6 (H) 4.8 - 5.6 % Final    Comment:    (NOTE) Pre diabetes:          5.7%-6.4%  Diabetes:              >6.4%  Glycemic control for   <7.0% adults with diabetes   10/01/2018 04:02 PM 11.9 (H) 4.8 - 5.6 % Final    Comment:    (NOTE) Pre diabetes:          5.7%-6.4% Diabetes:              >6.4% Glycemic control for   <7.0% adults with diabetes     CBG: Recent Labs  Lab 01/19/20 0623 01/19/20 0806 01/19/20 0931 01/19/20 1004 01/19/20 1103  GLUCAP 136* 262* 94 85 129*    Assessment & Plan:  Acute hypoxic respiratory failure -Continue with current related support, PRVC 6 cc/kg.  Not in a position or hemodynamically stable enough to consider spontaneous breathing trials -Continue daily wake-up assessments, continue current level of sedation but added low dose versed for amnestic effects -no new imaging, will repeat in am Cardiogenic shock Acute on chronic systolic heart failure requiring mechanical cardiac support with Impala and titration of inotropes Severe coronary artery disease status post CABG, complicated by cardiac tamponade, post hematoma removal in OR x 2 -Continue pressors, inotropes as ordered, weaning as able -Continue nitric oxide 21 ppm for now, consider weaning per CTS. -on impella at p8 - remains on crrt with UF increasing to 200-300 negative as long as can maintain map >65 with support Afib:  -amio gtt   Bipolar affective Diabetes type 2 with hyperglycemia -insulin infusion -a1c 10.6 Paroxysmal atrial fibrillation requiring amiodarone infusion.  Acute kidney  injury-improving cardiorenal syndrome  Acute blood loss anemia -Volume removal still an issue.  Remains on crrt -On empiric antibiotics -Following BMP, electrolytes -Following CBC, goal hemoglobin 8.0 -on heparin gtt thru purge  Transaminitis:  -improving    Daily Goals Checklist  Pain/Anxiety/Delirium protocol (if indicated): versed and fentanyl infusions.  VAP protocol (if indicated): per protocol DVT prophylaxis: SCD GI prophylaxis: Protonix Glucose control: insulin infusion Code Status: Full code Family Communication: Per primary team but able to update wife at bedside re: vent Disposition: ICU    Total critical care time: 35 minutes  Critical care time: The patient is critically ill with multiple organ systems  failure and requires high complexity decision making for assessment and support, frequent evaluation and titration of therapies, application of advanced monitoring technologies and extensive interpretation of multiple databases.  Critical care time 35 mins. This represents my time independent of the NPs time taking care of the pt. This is excluding procedures.    [facility] 01/19/2020, 11:52 AM

## 2020-01-19 NOTE — Progress Notes (Signed)
Echocardiogram 2D Echocardiogram has been performed.  Oneal Deputy Lazarus [place] 01/19/2020, 10:49 AM

## 2020-01-19 NOTE — Progress Notes (Signed)
6 Days Post-Op Procedure(s) (LRB): RE-EXPLORATION POST OPERATIVE OPEN HEART (N/A) TRANSESOPHAGEAL ECHOCARDIOGRAM (TEE) (N/A) Subjective: Fluid overload significantly better  Objective: Vital signs in last 24 hours: Temp:  [97.34 F (36.3 C)-99.5 F (37.5 C)] 97.34 F (36.3 C) (12/22 1900) Pulse Rate:  [40-126] 77 (12/22 1900) Cardiac Rhythm: Atrial fibrillation (12/22 0800) Resp:  [17-33] 22 (12/22 1900) BP: (90-92)/(67-68) 90/67 (12/22 0418) SpO2:  [86 %-100 %] 98 % (12/22 1900) Arterial Line BP: (60-94)/(50-65) 75/56 (12/22 1900) FiO2 (%):  [40 %-50 %] 40 % (12/22 1552) Weight:  [115.2 kg-116.8 kg] 115.2 kg (12/22 1000)  Hemodynamic parameters for last 24 hours: PAP: (26-40)/(13-25) 35/22 CVP:  [6 mmHg-17 mmHg] 14 mmHg CO:  [4.8 L/min-5.8 L/min] 5.8 L/min CI:  [2.1 L/min/m2-2.5 L/min/m2] 2.5 L/min/m2  Intake/Output from previous day: 12/21 0701 - 12/22 0700 In: 5334.8 [I.V.:2795; NG/GT:1370; IV Piggyback:955.8] Out: 9127 [Urine:75; Chest Tube:110] Intake/Output this shift: No intake/output data recorded.    Lab Results: Recent Labs    01/18/20 0430 01/18/20 1525 01/19/20 0338 01/19/20 0444 01/19/20 1323 01/19/20 1652  WBC 19.8*  --  18.9*  --   --   --   HGB 9.0*   < > 9.0*   < > 10.5* 10.2*  HCT 28.8*   < > 28.1*   < > 31.0* 30.0*  PLT 231  --  279  --   --   --    < > = values in this interval not displayed.   BMET:  Recent Labs    01/19/20 0338 01/19/20 0444 01/19/20 1641 01/19/20 1652  NA 135   < > 135 137  K 4.0   < > 4.1 4.2  CL 103  --  103  --   CO2 23  --  22  --   GLUCOSE 127*  --  156*  --   BUN 29*  --  32*  --   CREATININE 1.87*  --  1.91*  --   CALCIUM 8.1*  --  7.9*  --    < > = values in this interval not displayed.    PT/INR: No results for input(s): LABPROT, INR in the last 72 hours. ABG    Component Value Date/Time   PHART 7.339 (L) 01/19/2020 1652   HCO3 25.4 01/19/2020 1652   TCO2 27 01/19/2020 1652   ACIDBASEDEF 1.0  01/19/2020 1652   O2SAT 96.0 01/19/2020 1652   CBG (last 3)  Recent Labs    01/19/20 1647 01/19/20 1759 01/19/20 1859  GLUCAP 161* 156* 152*    Assessment/Plan: S/P Procedure(s) (LRB): RE-EXPLORATION POST OPERATIVE OPEN HEART (N/A) TRANSESOPHAGEAL ECHOCARDIOGRAM (TEE) (N/A) Plan return to OR tomorrow early afternoon for sternal closure   LOS: 20 days    Corey Palmer 01/19/2020

## 2020-01-20 ENCOUNTER — Inpatient Hospital Stay (HOSPITAL_COMMUNITY): Payer: BC Managed Care – PPO

## 2020-01-20 ENCOUNTER — Inpatient Hospital Stay (HOSPITAL_COMMUNITY): Payer: BC Managed Care – PPO | Admitting: Certified Registered Nurse Anesthetist

## 2020-01-20 ENCOUNTER — Encounter (HOSPITAL_COMMUNITY): Admission: EM | Disposition: E | Payer: Self-pay | Source: Home / Self Care | Attending: Cardiothoracic Surgery

## 2020-01-20 ENCOUNTER — Encounter (HOSPITAL_COMMUNITY): Payer: Self-pay | Admitting: Cardiothoracic Surgery

## 2020-01-20 DIAGNOSIS — I340 Nonrheumatic mitral (valve) insufficiency: Secondary | ICD-10-CM | POA: Diagnosis not present

## 2020-01-20 DIAGNOSIS — I97638 Postprocedural hematoma of a circulatory system organ or structure following other circulatory system procedure: Secondary | ICD-10-CM | POA: Diagnosis not present

## 2020-01-20 HISTORY — PX: APPLICATION OF WOUND VAC: SHX5189

## 2020-01-20 HISTORY — PX: TEE WITHOUT CARDIOVERSION: SHX5443

## 2020-01-20 HISTORY — PX: STERNAL CLOSURE: SHX6203

## 2020-01-20 LAB — COMPREHENSIVE METABOLIC PANEL
ALT: 89 U/L — ABNORMAL HIGH (ref 0–44)
ALT: 76 U/L — ABNORMAL HIGH (ref 0–44)
AST: 46 U/L — ABNORMAL HIGH (ref 15–41)
AST: 42 U/L — ABNORMAL HIGH (ref 15–41)
Albumin: 2.1 g/dL — ABNORMAL LOW (ref 3.5–5.0)
Albumin: 2.1 g/dL — ABNORMAL LOW (ref 3.5–5.0)
Alkaline Phosphatase: 167 U/L — ABNORMAL HIGH (ref 38–126)
Alkaline Phosphatase: 162 U/L — ABNORMAL HIGH (ref 38–126)
Anion gap: 11 (ref 5–15)
Anion gap: 12 (ref 5–15)
BUN: 30 mg/dL — ABNORMAL HIGH (ref 6–20)
BUN: 33 mg/dL — ABNORMAL HIGH (ref 6–20)
CO2: 22 mmol/L (ref 22–32)
CO2: 21 mmol/L — ABNORMAL LOW (ref 22–32)
Calcium: 8.1 mg/dL — ABNORMAL LOW (ref 8.9–10.3)
Calcium: 8.5 mg/dL — ABNORMAL LOW (ref 8.9–10.3)
Chloride: 102 mmol/L (ref 98–111)
Chloride: 101 mmol/L (ref 98–111)
Creatinine, Ser: 1.81 mg/dL — ABNORMAL HIGH (ref 0.61–1.24)
Creatinine, Ser: 2.2 mg/dL — ABNORMAL HIGH (ref 0.61–1.24)
GFR, Estimated: 45 mL/min — ABNORMAL LOW (ref >60)
GFR, Estimated: 35 mL/min — ABNORMAL LOW (ref >60)
Glucose, Bld: 139 mg/dL — ABNORMAL HIGH (ref 70–99)
Glucose, Bld: 125 mg/dL — ABNORMAL HIGH (ref 70–99)
Potassium: 4.2 mmol/L (ref 3.5–5.1)
Potassium: 4.3 mmol/L (ref 3.5–5.1)
Sodium: 135 mmol/L (ref 135–145)
Sodium: 134 mmol/L — ABNORMAL LOW (ref 135–145)
Total Bilirubin: 0.9 mg/dL (ref 0.3–1.2)
Total Bilirubin: 1.2 mg/dL (ref 0.3–1.2)
Total Protein: 6.1 g/dL — ABNORMAL LOW (ref 6.5–8.1)
Total Protein: 6 g/dL — ABNORMAL LOW (ref 6.5–8.1)

## 2020-01-20 LAB — POCT I-STAT 7, (LYTES, BLD GAS, ICA,H+H)
Acid-base deficit: 1 mmol/L (ref 0.0–2.0)
Acid-base deficit: 5 mmol/L — ABNORMAL HIGH (ref 0.0–2.0)
Acid-base deficit: 2 mmol/L (ref 0.0–2.0)
Bicarbonate: 25.2 mmol/L (ref 20.0–28.0)
Bicarbonate: 21 mmol/L (ref 20.0–28.0)
Bicarbonate: 24.4 mmol/L (ref 20.0–28.0)
Calcium, Ion: 1.17 mmol/L (ref 1.15–1.40)
Calcium, Ion: 1.22 mmol/L (ref 1.15–1.40)
Calcium, Ion: 1.21 mmol/L (ref 1.15–1.40)
HCT: 28 % — ABNORMAL LOW (ref 39.0–52.0)
HCT: 29 % — ABNORMAL LOW (ref 39.0–52.0)
HCT: 29 % — ABNORMAL LOW (ref 39.0–52.0)
Hemoglobin: 9.5 g/dL — ABNORMAL LOW (ref 13.0–17.0)
Hemoglobin: 9.9 g/dL — ABNORMAL LOW (ref 13.0–17.0)
Hemoglobin: 9.9 g/dL — ABNORMAL LOW (ref 13.0–17.0)
O2 Saturation: 97 %
O2 Saturation: 97 %
O2 Saturation: 97 %
Patient temperature: 36
Patient temperature: 37.5
Patient temperature: 36.9
Potassium: 4.1 mmol/L (ref 3.5–5.1)
Potassium: 4.7 mmol/L (ref 3.5–5.1)
Potassium: 4.4 mmol/L (ref 3.5–5.1)
Sodium: 136 mmol/L (ref 135–145)
Sodium: 135 mmol/L (ref 135–145)
Sodium: 137 mmol/L (ref 135–145)
TCO2: 27 mmol/L (ref 22–32)
TCO2: 22 mmol/L (ref 22–32)
TCO2: 26 mmol/L (ref 22–32)
pCO2 arterial: 44.8 mmHg (ref 32.0–48.0)
pCO2 arterial: 44 mmHg (ref 32.0–48.0)
pCO2 arterial: 47.2 mmHg (ref 32.0–48.0)
pH, Arterial: 7.353 (ref 7.350–7.450)
pH, Arterial: 7.289 — ABNORMAL LOW (ref 7.350–7.450)
pH, Arterial: 7.322 — ABNORMAL LOW (ref 7.350–7.450)
pO2, Arterial: 87 mmHg (ref 83.0–108.0)
pO2, Arterial: 99 mmHg (ref 83.0–108.0)
pO2, Arterial: 99 mmHg (ref 83.0–108.0)

## 2020-01-20 LAB — GLUCOSE, CAPILLARY
Glucose-Capillary: 120 mg/dL — ABNORMAL HIGH (ref 70–99)
Glucose-Capillary: 120 mg/dL — ABNORMAL HIGH (ref 70–99)
Glucose-Capillary: 121 mg/dL — ABNORMAL HIGH (ref 70–99)
Glucose-Capillary: 122 mg/dL — ABNORMAL HIGH (ref 70–99)
Glucose-Capillary: 123 mg/dL — ABNORMAL HIGH (ref 70–99)
Glucose-Capillary: 125 mg/dL — ABNORMAL HIGH (ref 70–99)
Glucose-Capillary: 127 mg/dL — ABNORMAL HIGH (ref 70–99)
Glucose-Capillary: 127 mg/dL — ABNORMAL HIGH (ref 70–99)
Glucose-Capillary: 128 mg/dL — ABNORMAL HIGH (ref 70–99)
Glucose-Capillary: 128 mg/dL — ABNORMAL HIGH (ref 70–99)
Glucose-Capillary: 129 mg/dL — ABNORMAL HIGH (ref 70–99)
Glucose-Capillary: 129 mg/dL — ABNORMAL HIGH (ref 70–99)
Glucose-Capillary: 130 mg/dL — ABNORMAL HIGH (ref 70–99)
Glucose-Capillary: 130 mg/dL — ABNORMAL HIGH (ref 70–99)
Glucose-Capillary: 131 mg/dL — ABNORMAL HIGH (ref 70–99)
Glucose-Capillary: 134 mg/dL — ABNORMAL HIGH (ref 70–99)
Glucose-Capillary: 137 mg/dL — ABNORMAL HIGH (ref 70–99)
Glucose-Capillary: 137 mg/dL — ABNORMAL HIGH (ref 70–99)
Glucose-Capillary: 138 mg/dL — ABNORMAL HIGH (ref 70–99)
Glucose-Capillary: 138 mg/dL — ABNORMAL HIGH (ref 70–99)

## 2020-01-20 LAB — CBC WITH DIFFERENTIAL/PLATELET
Abs Immature Granulocytes: 1.09 10*3/uL — ABNORMAL HIGH (ref 0.00–0.07)
Basophils Absolute: 0.1 10*3/uL (ref 0.0–0.1)
Basophils Relative: 1 %
Eosinophils Absolute: 0.5 10*3/uL (ref 0.0–0.5)
Eosinophils Relative: 2 %
HCT: 28.9 % — ABNORMAL LOW (ref 39.0–52.0)
Hemoglobin: 8.8 g/dL — ABNORMAL LOW (ref 13.0–17.0)
Immature Granulocytes: 5 %
Lymphocytes Relative: 5 %
Lymphs Abs: 1.1 10*3/uL (ref 0.7–4.0)
MCH: 28.7 pg (ref 26.0–34.0)
MCHC: 30.4 g/dL (ref 30.0–36.0)
MCV: 94.1 fL (ref 80.0–100.0)
Monocytes Absolute: 2.2 10*3/uL — ABNORMAL HIGH (ref 0.1–1.0)
Monocytes Relative: 11 %
Neutro Abs: 16.2 10*3/uL — ABNORMAL HIGH (ref 1.7–7.7)
Neutrophils Relative %: 76 %
Platelets: 280 10*3/uL (ref 150–400)
RBC: 3.07 MIL/uL — ABNORMAL LOW (ref 4.22–5.81)
RDW: 20.7 % — ABNORMAL HIGH (ref 11.5–15.5)
WBC: 21.2 10*3/uL — ABNORMAL HIGH (ref 4.0–10.5)
nRBC: 1.6 % — ABNORMAL HIGH (ref 0.0–0.2)

## 2020-01-20 LAB — CBC
HCT: 28.7 % — ABNORMAL LOW (ref 39.0–52.0)
Hemoglobin: 9.2 g/dL — ABNORMAL LOW (ref 13.0–17.0)
MCH: 30.1 pg (ref 26.0–34.0)
MCHC: 32.1 g/dL (ref 30.0–36.0)
MCV: 93.8 fL (ref 80.0–100.0)
Platelets: 346 10*3/uL (ref 150–400)
RBC: 3.06 MIL/uL — ABNORMAL LOW (ref 4.22–5.81)
RDW: 21.5 % — ABNORMAL HIGH (ref 11.5–15.5)
WBC: 26.3 10*3/uL — ABNORMAL HIGH (ref 4.0–10.5)
nRBC: 1.7 % — ABNORMAL HIGH (ref 0.0–0.2)

## 2020-01-20 LAB — HEPARIN LEVEL (UNFRACTIONATED): Heparin Unfractionated: <0.1 IU/mL — ABNORMAL LOW (ref 0.30–0.70)

## 2020-01-20 LAB — CULTURE, RESPIRATORY W GRAM STAIN: Special Requests: NORMAL

## 2020-01-20 LAB — PREPARE RBC (CROSSMATCH)

## 2020-01-20 LAB — COOXEMETRY PANEL
Carboxyhemoglobin: 1.5 % (ref 0.5–1.5)
Methemoglobin: 1.1 % (ref 0.0–1.5)
O2 Saturation: 51.4 %
Total hemoglobin: 9.9 g/dL — ABNORMAL LOW (ref 12.0–16.0)

## 2020-01-20 LAB — MAGNESIUM
Magnesium: 2.4 mg/dL (ref 1.7–2.4)
Magnesium: 2.6 mg/dL — ABNORMAL HIGH (ref 1.7–2.4)

## 2020-01-20 LAB — LACTATE DEHYDROGENASE: LDH: 466 U/L — ABNORMAL HIGH (ref 98–192)

## 2020-01-20 LAB — PROTIME-INR
INR: 1.3 — ABNORMAL HIGH (ref 0.8–1.2)
Prothrombin Time: 15.5 seconds — ABNORMAL HIGH (ref 11.4–15.2)

## 2020-01-20 LAB — PHOSPHORUS
Phosphorus: 2.6 mg/dL (ref 2.5–4.6)
Phosphorus: 3.9 mg/dL (ref 2.5–4.6)

## 2020-01-20 SURGERY — CLOSURE, STERNUM
Anesthesia: General | Site: Chest

## 2020-01-20 MED ORDER — LACTATED RINGERS IV SOLN: INTRAVENOUS | Status: DC | PRN

## 2020-01-20 MED ORDER — SODIUM CHLORIDE 0.9 % IV SOLN
1.0000 g | Freq: Three times a day (TID) | INTRAVENOUS | Status: DC
  Administered 2020-01-20 – 2020-01-28 (×23): 1 g via INTRAVENOUS
  Filled 2020-01-20 (×25): qty 1

## 2020-01-20 MED ORDER — SODIUM BICARBONATE 8.4 % IV SOLN: 100.0000 meq | Freq: Once | INTRAVENOUS | Status: AC

## 2020-01-20 MED ORDER — ALBUMIN HUMAN 5 % IV SOLN: 25.0000 g | Freq: Once | INTRAVENOUS | Status: AC

## 2020-01-20 MED ORDER — SODIUM CHLORIDE 0.9% IV SOLUTION: Freq: Once | INTRAVENOUS | Status: DC

## 2020-01-20 MED ORDER — MIDAZOLAM HCL 5 MG/5ML IJ SOLN
INTRAMUSCULAR | Status: DC | PRN
  Administered 2020-01-20: 2 mg via INTRAVENOUS

## 2020-01-20 MED ORDER — FENTANYL CITRATE (PF) 250 MCG/5ML IJ SOLN
INTRAMUSCULAR | Status: DC | PRN
  Administered 2020-01-20: 50 ug via INTRAVENOUS

## 2020-01-20 MED ORDER — ROCURONIUM BROMIDE 10 MG/ML (PF) SYRINGE
PREFILLED_SYRINGE | INTRAVENOUS | Status: DC | PRN
  Administered 2020-01-20 (×2): 50 mg via INTRAVENOUS

## 2020-01-20 MED ORDER — CALCIUM CHLORIDE 10 % IV SOLN
INTRAVENOUS | Status: AC
  Administered 2020-01-20: 17:00:00 1 g via INTRAVENOUS
  Filled 2020-01-20: qty 10

## 2020-01-20 MED ORDER — SODIUM CHLORIDE (PF) 0.9 % IJ SOLN
INTRAMUSCULAR | Status: AC
  Filled 2020-01-20: qty 10

## 2020-01-20 MED ORDER — CALCIUM CHLORIDE 10 % IV SOLN
INTRAVENOUS | Status: DC | PRN
  Administered 2020-01-20: 300 mg via INTRAVENOUS

## 2020-01-20 MED ORDER — 0.9 % SODIUM CHLORIDE (POUR BTL) OPTIME
TOPICAL | Status: DC | PRN
  Administered 2020-01-20: 11:00:00 2000 mL

## 2020-01-20 MED ORDER — CHLORHEXIDINE GLUCONATE 0.12 % MT SOLN
OROMUCOSAL | Status: AC
  Administered 2020-01-20: 21:00:00 15 mL via OROMUCOSAL
  Filled 2020-01-20: qty 15

## 2020-01-20 MED ORDER — FENTANYL CITRATE (PF) 250 MCG/5ML IJ SOLN
INTRAMUSCULAR | Status: AC
  Filled 2020-01-20: qty 5

## 2020-01-20 MED ORDER — HEMOSTATIC AGENTS (NO CHARGE) OPTIME
TOPICAL | Status: DC | PRN
  Administered 2020-01-20 (×2): 1 via TOPICAL

## 2020-01-20 MED ORDER — CALCIUM CHLORIDE 10 % IV SOLN
INTRAVENOUS | Status: AC
  Filled 2020-01-20: qty 10

## 2020-01-20 MED ORDER — VANCOMYCIN HCL 1250 MG/250ML IV SOLN
1250.0000 mg | INTRAVENOUS | Status: DC
  Administered 2020-01-21 – 2020-01-22 (×2): 1250 mg via INTRAVENOUS
  Filled 2020-01-20 (×2): qty 250

## 2020-01-20 MED ORDER — VANCOMYCIN HCL 1500 MG/300ML IV SOLN
1500.0000 mg | INTRAVENOUS | Status: AC
  Administered 2020-01-20: 14:00:00 1500 mg via INTRAVENOUS
  Filled 2020-01-20: qty 300

## 2020-01-20 MED ORDER — MIDAZOLAM HCL (PF) 10 MG/2ML IJ SOLN
INTRAMUSCULAR | Status: AC
  Filled 2020-01-20: qty 2

## 2020-01-20 MED ORDER — CALCIUM CHLORIDE 10 % IV SOLN: 1.0000 g | Freq: Once | INTRAVENOUS | Status: AC

## 2020-01-20 MED ORDER — SODIUM BICARBONATE 8.4 % IV SOLN
INTRAVENOUS | Status: AC
  Administered 2020-01-20: 18:00:00 100 meq via INTRAVENOUS
  Filled 2020-01-20: qty 100

## 2020-01-20 MED ORDER — ALBUMIN HUMAN 5 % IV SOLN
INTRAVENOUS | Status: AC
  Administered 2020-01-20: 17:00:00 25 g via INTRAVENOUS
  Filled 2020-01-20: qty 500

## 2020-01-20 MED ORDER — VANCOMYCIN HCL 1000 MG IV SOLR
INTRAVENOUS | Status: AC
  Filled 2020-01-20: qty 1000

## 2020-01-20 MED ORDER — VANCOMYCIN HCL 1000 MG IV SOLR
INTRAVENOUS | Status: DC | PRN
  Administered 2020-01-20: 14:00:00 1000 mL

## 2020-01-20 SURGICAL SUPPLY — 85 items
AGENT HMST KT MTR STRL THRMB (HEMOSTASIS) ×2
ANCHOR CATH FOLEY SECURE (MISCELLANEOUS) ×4 IMPLANT
APL SKNCLS STERI-STRIP NONHPOA (GAUZE/BANDAGES/DRESSINGS)
BAG DECANTER FOR FLEXI CONT (MISCELLANEOUS) ×4 IMPLANT
BAND INSRT 18 STRL LF DISP RB (MISCELLANEOUS)
BAND RUBBER #18 3X1/16 STRL (MISCELLANEOUS) IMPLANT
BENZOIN TINCTURE PRP APPL 2/3 (GAUZE/BANDAGES/DRESSINGS) IMPLANT
BLADE CLIPPER SURG (BLADE) IMPLANT
BLADE SAW SAG 29X58X.64 (BLADE) IMPLANT
CANISTER SUCT 3000ML PPV (MISCELLANEOUS) ×4 IMPLANT
CANISTER WOUNDNEG PRESSURE 500 (CANNISTER) ×4 IMPLANT
CATH THORACIC 28FR RT ANG (CATHETERS) ×8 IMPLANT
CATH THORACIC 36FR (CATHETERS) IMPLANT
CNTNR URN SCR LID CUP LEK RST (MISCELLANEOUS) IMPLANT
CONN ST 1/4X3/8  BEN (MISCELLANEOUS) ×8
CONN ST 1/4X3/8 BEN (MISCELLANEOUS) ×4 IMPLANT
CONN Y 3/8X3/8X3/8  BEN (MISCELLANEOUS) ×4
CONN Y 3/8X3/8X3/8 BEN (MISCELLANEOUS) ×2 IMPLANT
CONNECTOR Y 1/4X3/8X3/8 STRL (MISCELLANEOUS) ×4 IMPLANT
CONT SPEC 4OZ STRL OR WHT (MISCELLANEOUS)
DRAIN CHANNEL 28F RND 3/8 FF (WOUND CARE) ×4 IMPLANT
DRAPE HALF SHEET 40X57 (DRAPES) ×8 IMPLANT
DRAPE LAPAROSCOPIC ABDOMINAL (DRAPES) ×4 IMPLANT
DRAPE SLUSH MACHINE 52X66 (DRAPES) ×4 IMPLANT
DRAPE SLUSH/WARMER DISC (DRAPES) IMPLANT
DRESSING PEEL AND PLAC PRVNA20 (GAUZE/BANDAGES/DRESSINGS) ×2 IMPLANT
DRSG AQUACEL AG ADV 3.5X 6 (GAUZE/BANDAGES/DRESSINGS) ×4 IMPLANT
DRSG AQUACEL AG ADV 3.5X14 (GAUZE/BANDAGES/DRESSINGS) ×4 IMPLANT
DRSG PAD ABDOMINAL 8X10 ST (GAUZE/BANDAGES/DRESSINGS) IMPLANT
DRSG PEEL AND PLACE PREVENA 20 (GAUZE/BANDAGES/DRESSINGS) ×4
DRSG TEGADERM 4X4.5 CHG (GAUZE/BANDAGES/DRESSINGS) ×4 IMPLANT
ELECT REM PT RETURN 9FT ADLT (ELECTROSURGICAL) ×4
ELECTRODE REM PT RTRN 9FT ADLT (ELECTROSURGICAL) ×2 IMPLANT
GAUZE SPONGE 4X4 12PLY STRL (GAUZE/BANDAGES/DRESSINGS) ×4 IMPLANT
GAUZE SPONGE 4X4 12PLY STRL LF (GAUZE/BANDAGES/DRESSINGS) ×4 IMPLANT
GAUZE XEROFORM 5X9 LF (GAUZE/BANDAGES/DRESSINGS) IMPLANT
GLOVE BIO SURGEON STRL SZ 6.5 (GLOVE) ×18 IMPLANT
GLOVE BIO SURGEON STRL SZ7.5 (GLOVE) ×12 IMPLANT
GLOVE BIO SURGEONS STRL SZ 6.5 (GLOVE) ×6
GLOVE SURG UNDER POLY LF SZ6.5 (GLOVE) ×8 IMPLANT
GOWN STRL REUS W/ TWL LRG LVL3 (GOWN DISPOSABLE) ×12 IMPLANT
GOWN STRL REUS W/TWL LRG LVL3 (GOWN DISPOSABLE) ×24
HEMOSTAT POWDER SURGIFOAM 1G (HEMOSTASIS) IMPLANT
HEMOSTAT SURGICEL 2X14 (HEMOSTASIS) ×4 IMPLANT
KIT BASIN OR (CUSTOM PROCEDURE TRAY) ×4 IMPLANT
KIT SUCTION CATH 14FR (SUCTIONS) ×4 IMPLANT
KIT TURNOVER KIT B (KITS) ×4 IMPLANT
NS IRRIG 1000ML POUR BTL (IV SOLUTION) ×8 IMPLANT
PACK CHEST (CUSTOM PROCEDURE TRAY) ×4 IMPLANT
PAD ARMBOARD 7.5X6 YLW CONV (MISCELLANEOUS) ×8 IMPLANT
PIN SAFETY STERILE (MISCELLANEOUS) IMPLANT
SOL PREP POV-IOD 4OZ 10% (MISCELLANEOUS) ×4 IMPLANT
SOL PREP PROV IODINE SCRUB 4OZ (MISCELLANEOUS) ×4 IMPLANT
SPONGE LAP 18X18 RF (DISPOSABLE) IMPLANT
STAPLER VISISTAT 35W (STAPLE) IMPLANT
SURGIFLO W/THROMBIN 8M KIT (HEMOSTASIS) ×4 IMPLANT
SUT BONE WAX W31G (SUTURE) ×4 IMPLANT
SUT ETHILON 3 0 FSL (SUTURE) ×4 IMPLANT
SUT SILK  1 MH (SUTURE) ×16
SUT SILK 1 MH (SUTURE) ×8 IMPLANT
SUT SILK 2 0 SH CR/8 (SUTURE) ×4 IMPLANT
SUT STEEL 6MS V (SUTURE) ×4 IMPLANT
SUT STEEL STERNAL CCS#1 18IN (SUTURE) ×8 IMPLANT
SUT STEEL SZ 6 DBL 3X14 BALL (SUTURE) ×8 IMPLANT
SUT STERNA BAND STERNOTOMY (SUTURE) IMPLANT
SUT VIC AB 1 CTX 18 (SUTURE) IMPLANT
SUT VIC AB 1 CTX 36 (SUTURE) ×12
SUT VIC AB 1 CTX36XBRD ANBCTR (SUTURE) ×2 IMPLANT
SUT VIC AB 1 CTX36XBRD ANBCTRL (SUTURE) ×4 IMPLANT
SUT VIC AB 2-0 CTX 27 (SUTURE) IMPLANT
SUT VIC AB 2-0 CTX 36 (SUTURE) ×16 IMPLANT
SUT VIC AB 3-0 SH 8-18 (SUTURE) ×4 IMPLANT
SUT VIC AB 3-0 X1 27 (SUTURE) ×8 IMPLANT
SWAB COLLECTION DEVICE MRSA (MISCELLANEOUS) ×4 IMPLANT
SWAB CULTURE ESWAB REG 1ML (MISCELLANEOUS) ×4 IMPLANT
SYR 20ML LL LF (SYRINGE) ×4 IMPLANT
SYR BULB IRRIG 60ML STRL (SYRINGE) ×8 IMPLANT
SYSTEM SAHARA CHEST DRAIN ATS (WOUND CARE) ×8 IMPLANT
TAPE CLOTH SURG 4X10 WHT LF (GAUZE/BANDAGES/DRESSINGS) ×4 IMPLANT
TOWEL GREEN STERILE (TOWEL DISPOSABLE) ×4 IMPLANT
TOWEL GREEN STERILE FF (TOWEL DISPOSABLE) ×4 IMPLANT
TRAY CATH LUMEN 1 20CM STRL (SET/KITS/TRAYS/PACK) ×4 IMPLANT
TUBE CONNECTING 20'X1/4 (TUBING) ×1
TUBE CONNECTING 20X1/4 (TUBING) ×3 IMPLANT
WATER STERILE IRR 1000ML POUR (IV SOLUTION) ×4 IMPLANT

## 2020-01-20 NOTE — Anesthesia Procedure Notes (Signed)
Date/Time: 01/25/2020 1:49 PM Performed by: Colin [name], CRNA Pre-anesthesia Checklist: Patient identified, Emergency Drugs available, Suction available and Patient being monitored Patient Re-evaluated:Patient Re-evaluated prior to induction Oxygen Delivery Method: Circle system utilized Preoxygenation: Pre-oxygenation with 100% oxygen Induction Type: Inhalational induction with existing ETT Airway Equipment and Method: Oral airway Placement Confirmation: positive ETCO2 and breath sounds checked- equal and bilateral Tube secured with: Hollister tube holder. Dental Injury: Teeth and Oropharynx as per pre-operative assessment

## 2020-01-20 NOTE — Progress Notes (Signed)
Patient transported from OR to room 8O41 without complications.

## 2020-01-20 NOTE — Progress Notes (Signed)
   Patient back from sternal closure.   Now on epi at 4. Impella at P-6 hemodynamics and pulsativity much better. VAD interrogated personally. Parameters stable.  No significant bleeding from CTs. LE edema much improved. He is warm.   Swan replaced in OR.   Will continue to follow.   Additional CCT 35 mins  Glori Bickers, MD  6:02 PM

## 2020-01-20 NOTE — Brief Op Note (Signed)
01/24/2020 - 01/04/2020  3:41 PM  PATIENT:  Corey Palmer  49 y.o. 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  PROCEDURE:  Procedure(s): STERNAL CLOSURE (N/A) TRANSESOPHAGEAL ECHOCARDIOGRAM (TEE) (N/A) APPLICATION OF WOUND VAC (N/A) Placement of L subclavian central line SURGEON:  Surgeon(s) and Role:    Ivin Poot, MD - Primary  PHYSICIAN ASSISTANT:   ASSISTANTS: RNFA   ANESTHESIA:   general  EBL: 200  BLOOD ADMINISTERED:none  DRAINS: bilateral pleural tubes , ant mediastinal chest tube   LOCAL MEDICATIONS USED:  NONE  SPECIMEN:  Aspirate  DISPOSITION OF SPECIMEN:  microbiology culture of mediastinum  COUNTS:  YES   TOURNIQUET:  * No tourniquets in log *  DICTATION: .Dragon Dictation  PLAN OF CARE: return to [facility] ICU  PATIENT DISPOSITION:  ICU - intubated and hemodynamically stable.   Delay start of Pharmacological VTE agent (>24hrs) due to surgical blood loss or risk of bleeding: yes

## 2020-01-20 NOTE — Progress Notes (Signed)
EVENING ROUNDS NOTE :     [address]       [place] [address]             [phone]                 Day of Surgery Procedure(s) (LRB): STERNAL CLOSURE (N/A) TRANSESOPHAGEAL ECHOCARDIOGRAM (TEE) (N/A) APPLICATION OF WOUND VAC (N/A)   Total Length of Stay:  LOS: 21 days  Events:   Chest closed Increased to P8 Continue full support    BP 94/63   Pulse (!) 112   Temp (!) 96.98 F (36.1 C)   Resp (!) 28   Ht 6\' 3"  (1.905 m)   Wt 112.9 kg   SpO2 100%   BMI 31.11 kg/m   PAP: (30-40)/(18-25) 36/21 CVP:  [12 mmHg-17 mmHg] 13 mmHg CO:  [4.1 L/min-5.5 L/min] 5.5 L/min CI:  [1.8 L/min/m2-2.4 L/min/m2] 2.4 L/min/m2  Vent Mode: PRVC FiO2 (%):  [40 %] 40 % Set Rate:  [28 bmp] 28 bmp Vt Set:  [510 mL] 510 mL PEEP:  [5 cmH20] 5 cmH20 Plateau Pressure:  [23 cmH20-27 cmH20] 23 cmH20  .  prismasol BGK 4/2.5 500 mL/hr at 01/09/2020 1152  .  prismasol BGK 4/2.5 200 mL/hr at 01/19/20 1602  . sodium chloride Stopped (01/23/2020 1646)  . sodium chloride 10 mL/hr at 01/14/20 2101  . sodium chloride Stopped (01/16/20 1032)  . amiodarone 60 mg/hr (01/22/2020 1331)  . epinephrine 7 mcg/min (01/11/2020 1432)  . fentaNYL infusion INTRAVENOUS 150 mcg/hr (12/30/2019 1331)  . impella catheter heparin 50 unit/mL in dextrose 5%    . insulin 6 Units/hr (01/22/2020 1331)  . lactated ringers 20 mL/hr at 01/01/2020 1300  . meropenem (MERREM) IV Stopped (01/19/2020 5277)  . midazolam 4 mg/hr (01/25/2020 1331)  . milrinone 0.125 mcg/kg/min (01/24/2020 1331)  . norepinephrine (LEVOPHED) Adult infusion 28 mcg/min (12/30/2019 1513)  . prismasol BGK 4/2.5 1,500 mL/hr at 01/08/2020 0907  . [START ON 01/21/2020] vancomycin    . vasopressin 0.04 Units/min (01/12/2020 1331)    I/O last 3 completed shifts: In: 7930.7 [I.V.:4818.5; Other:300.9; NG/GT:2020; IV Piggyback:791.4] Out: 12903 [Urine:67; OEUMP:53614; Stool:500; Chest Tube:268]   CBC Latest Ref Rng & Units 01/28/2020 01/06/2020 01/19/2020   WBC 4.0 - 10.5 K/uL - 21.2(H) -  Hemoglobin 13.0 - 17.0 g/dL 9.5(L) 8.8(L) 10.2(L)  Hematocrit 39.0 - 52.0 % 28.0(L) 28.9(L) 30.0(L)  Platelets 150 - 400 K/uL - 280 -    BMP Latest Ref Rng & Units 01/08/2020 01/24/2020 01/19/2020  Glucose 70 - 99 mg/dL - 139(H) -  BUN 6 - 20 mg/dL - 30(H) -  Creatinine 0.61 - 1.24 mg/dL - 1.81(H) -  BUN/Creat Ratio 9 - 20 - - -  Sodium 135 - 145 mmol/L 136 135 137  Potassium 3.5 - 5.1 mmol/L 4.1 4.2 4.2  Chloride 98 - 111 mmol/L - 102 -  CO2 22 - 32 mmol/L - 22 -  Calcium 8.9 - 10.3 mg/dL - 8.1(L) -    ABG    Component Value Date/Time   PHART 7.353 01/27/2020 0310   PCO2ART 44.8 01/07/2020 0310   PO2ART 87 01/07/2020 0310   HCO3 25.2 01/15/2020 0310   TCO2 27 01/21/2020 0310   ACIDBASEDEF 1.0 01/03/2020 0310   O2SAT 97.0 01/24/2020 0310       Melodie Bouillon, MD 12/30/2019 4:33 PM

## 2020-01-20 NOTE — Progress Notes (Signed)
[facility] for heparin Indication: Impella 5.5  Allergies  Allergen Reactions  . Orange Fruit Anaphylaxis, Hives and Other (See Comments)    Per Pt- Blisters around lips and Hives all over, also  . Penicillins Anaphylaxis    Did it involve swelling of the face/tongue/throat, SOB, or low BP? Yes Did it involve sudden or severe rash/hives, skin peeling, or any reaction on the inside of your mouth or nose? Yes Did you need to seek medical attention at a hospital or doctor's office? No When did it last happen?childhood If all above answers are "NO", may proceed with cephalosporin use.  [name] [Empagliflozin] Itching  . Basaglar Kwikpen [Insulin Glargine] Nausea And Vomiting    Patient Measurements: Height: 6\' 3"  (190.5 cm) Weight: 114 kg (251 lb 5.2 oz) IBW/kg (Calculated) : 84.5 Heparin dosing wt: 101 kg  Vital Signs: Temp: 96.8 F (36 C) (12/23 0815) Temp Source: Core (12/23 0800) BP: 94/63 (12/23 0330) Pulse Rate: 84 (12/23 0815)  Labs: Recent Labs    01/18/20 0430 01/18/20 1512 01/19/20 0338 01/19/20 0444 01/19/20 1641 01/19/20 1652 01/22/2020 0308 01/01/2020 0310  HGB 9.0*   < > 9.0*   < >  --  10.2* 8.8* 9.5*  HCT 28.8*   < > 28.1*   < >  --  30.0* 28.9* 28.0*  PLT 231  --  279  --   --   --  280  --   HEPARINUNFRC <0.10*  --  <0.10*  --   --   --  <0.10*  --   CREATININE 1.75*   < > 1.87*  --  1.91*  --  1.81*  --    < > = values in this interval not displayed.    Estimated Creatinine Clearance: 65.8 mL/min (A) (by C-G formula based on SCr of 1.81 mg/dL (H)).   Assessment: 49 yo male s/p impella 5.5 placement. Pt started on heparinized purge and systemic heparin. Pt now s/p CABG 12/15 with Impella remaining in place c/b significant bleeding and hematoma requiring opened chest and reexploration in OR 12/16.  Impella on P8 with stable pressures. Heparin running via purge only at this time, purge flow rate 8.9 ml/hr  (approx 445 units/hr heparin). Heparin level undetectable as expected, CBC stable this AM.LDH stable at 466. No s/sx of bleeding per nursing.   Goal of Therapy:  Heparin level 0.2-0.5 units/ml Monitor platelets by anticoagulation protocol: Yes   Plan:  Continue heparin via the purge. Will follow with CVTS regarding systemic heparin once appropriate to initiate Daily heparin level for now  [name], PharmD, [place] Pharmacist  Phone: [phone] 12/31/2019 8:39 AM  Please check AMION for all [facility] phone numbers After 10:00 PM, call [place] [phone]

## 2020-01-20 NOTE — Progress Notes (Signed)
7 Days Post-Op Procedure(s) (LRB): RE-EXPLORATION POST OPERATIVE OPEN HEART (N/A) TRANSESOPHAGEAL ECHOCARDIOGRAM (TEE) (N/A) Subjective: More fluid removed over night CVP 14  On stable dose epi, norepi Will return to OR for sternal closure- procedure d/w wife in person  Objective: Vital signs in last 24 hours: Temp:  [96.44 F (35.8 C)-99.5 F (37.5 C)] 96.62 F (35.9 C) (12/23 0700) Pulse Rate:  [40-126] 86 (12/23 0700) Cardiac Rhythm: Atrial fibrillation (12/23 0550) Resp:  [17-33] 27 (12/23 0700) BP: (83-95)/(63-67) 94/63 (12/23 0330) SpO2:  [86 %-100 %] 98 % (12/23 0744) Arterial Line BP: (60-90)/(53-68) 87/64 (12/23 0700) FiO2 (%):  [40 %-50 %] 40 % (12/23 0744) Weight:  [114 kg-115.2 kg] 114 kg (12/23 0500)  Hemodynamic parameters for last 24 hours: PAP: (30-40)/(18-25) 35/21 CVP:  [10 mmHg-17 mmHg] 13 mmHg CO:  [4.1 L/min-5.8 L/min] 4.9 L/min CI:  [1.8 L/min/m2-2.5 L/min/m2] 2.1 L/min/m2  Intake/Output from previous day: 12/22 0701 - 12/23 0700 In: 5443.4 [I.V.:3449.2; NG/GT:1250; IV Piggyback:551.7] Out: 8533 [Urine:22; Stool:500; Chest Tube:178] Intake/Output this shift: No intake/output data recorded.  Sedated, intubated moderate airleak from chest tubes Impella 5.5 secure R axillary artery  Lab Results: Recent Labs    01/19/20 0338 01/19/20 0444 01/19/2020 0308 01/07/2020 0310  WBC 18.9*  --  21.2*  --   HGB 9.0*   < > 8.8* 9.5*  HCT 28.1*   < > 28.9* 28.0*  PLT 279  --  280  --    < > = values in this interval not displayed.   BMET:  Recent Labs    01/19/20 1641 01/19/20 1652 01/18/2020 0308 12/29/2019 0310  NA 135   < > 135 136  K 4.1   < > 4.2 4.1  CL 103  --  102  --   CO2 22  --  22  --   GLUCOSE 156*  --  139*  --   BUN 32*  --  30*  --   CREATININE 1.91*  --  1.81*  --   CALCIUM 7.9*  --  8.1*  --    < > = values in this interval not displayed.    PT/INR: No results for input(s): LABPROT, INR in the last 72 hours. ABG    Component  Value Date/Time   PHART 7.353 01/22/2020 0310   HCO3 25.2 01/08/2020 0310   TCO2 27 01/01/2020 0310   ACIDBASEDEF 1.0 01/13/2020 0310   O2SAT 97.0 01/13/2020 0310   CBG (last 3)  Recent Labs    01/26/2020 0514 01/27/2020 0614 01/11/2020 0701  GLUCAP 121* 134* 137*    Assessment/Plan: S/P Procedure(s) (LRB): RE-EXPLORATION POST OPERATIVE OPEN HEART (N/A) TRANSESOPHAGEAL ECHOCARDIOGRAM (TEE) (N/A) Plan sternal closure today   LOS: 21 days    Corey Palmer 01/09/2020

## 2020-01-20 NOTE — Progress Notes (Signed)
NAME:  Corey Palmer, MRN:  [id number], DOB:  Mar 16, 1970, LOS: 21 ADMISSION DATE:  01/12/2020, CONSULTATION DATE: 01/24/2020 REFERRING MD: Aundra Dubin, CHIEF COMPLAINT: Respiratory failure post Impella  HPI/course in hospital  49 year old man underwent Impella 5.5 insertion 12/11 for persistent symptoms of forward failure with low cardiac indices.  He presented last week for ICD shocks and was found to be in rapid atrial fibrillation for which he was cardioverted.  He was also found to be in decompensated heart failure with an ejection fraction of 15% and multivessel coronary disease on left heart catheterization.  He was started on tropes and diuresed which improved his congestive symptoms but his index remained low so an Impella was placed to optimize his hemodynamics prior to a high risk redo-bypass.  Uneventful Impella implantation.  Patient returned from operating room intubated.  He had to go back to the OR for displacement of Impella 12/12.  Underwent CABGx4 on 12/15.  Returned to ICU on significant inotropic support and intubated.    Interim history/subjective:  12/23: no reported issues overnight. For OR time today for possible closure. Cont sedation but wean if possible once closed. Remains on lowered vent settings. fio2 40% 5 peep along with iNO. impella down to p7, remains on tremendous inotropic and vasopressor support as well as crrt.  12/22: issues with crrt overnight. Resuming today. ldh up somewhat. Remains on iNO and vasopressors/inotropic support per HF. Vent settings remain low.  12/21: started on versed gtt at this time. Off lasix infusion.  12/20: remains on great amount of support via impella and gtt. Also on NO at this time 21ppm. Pt only on 150mcg of fentanyl will add some low dose for amnestic effect.     Objective   Blood pressure 94/63, pulse (!) 53, temperature (!) 96.98 F (36.1 C), resp. rate (!) 25, height 6\' 3"  (1.905 m), weight 112.9 kg, SpO2 99 %. PAP:  (30-40)/(18-25) 36/21 CVP:  [10 mmHg-17 mmHg] 13 mmHg CO:  [4.1 L/min-5.8 L/min] 5.5 L/min CI:  [1.8 L/min/m2-2.5 L/min/m2] 2.4 L/min/m2  Vent Mode: PRVC FiO2 (%):  [40 %-50 %] 40 % Set Rate:  [24 bmp-28 bmp] 28 bmp Vt Set:  [510 mL] 510 mL PEEP:  [5 cmH20] 5 cmH20 Plateau Pressure:  [24 cmH20-34 cmH20] 24 cmH20   Intake/Output Summary (Last 24 hours) at 01/19/2020 1110 Last data filed at 01/25/2020 1000 Gross per 24 hour  Intake 4881.7 ml  Output 8530 ml  Net -3648.3 ml   Filed Weights   01/19/20 1000 01/07/2020 0500 01/17/2020 0815  Weight: 115.2 kg 114 kg 112.9 kg   PAP: (30-40)/(18-25) 36/21 CVP:  [10 mmHg-17 mmHg] 13 mmHg CO:  [4.1 L/min-5.8 L/min] 5.5 L/min CI:  [1.8 L/min/m2-2.5 L/min/m2] 2.4 L/min/m2  Examination: Physical exam: Exam is unchanged General: Ill-appearing man, intubated, sedated HEENT: ET tube in good position, OG tube in position, no oral lesions Neuro: Sedated, does not wake to voice this am Chest: Median sternotomy open, regular, Impella sounds audible, chest tubes in place, clear Abdomen: Nondistended, hypoactive bowel sounds Skin: Cool extremities, no rash  Ancillary tests (personally reviewed)  CBC: Recent Labs  Lab 01/17/20 0806 01/17/20 1525 01/17/20 1532 01/18/20 0430 01/18/20 1525 01/19/20 0338 01/19/20 0444 01/19/20 1323 01/19/20 1652 12/30/2019 0308 01/20/20 0310  WBC 19.8* 16.8*  --  19.8*  --  18.9*  --   --   --  21.2*  --   NEUTROABS  --   --   --  15.3*  --  14.5*  --   --   --  16.2*  --   HGB 9.1* 8.7*   < > 9.0*   < > 9.0* 9.5* 10.5* 10.2* 8.8* 9.5*  HCT 28.6* 27.2*   < > 28.8*   < > 28.1* 28.0* 31.0* 30.0* 28.9* 28.0*  MCV 90.5 90.7  --  92.0  --  92.4  --   --   --  94.1  --   PLT 222 207  --  231  --  279  --   --   --  280  --    < > = values in this interval not displayed.    Basic Metabolic Panel: Recent Labs  Lab 01/15/20 1733 01/15/20 1741 01/17/20 0324 01/17/20 0326 01/18/20 0430 01/18/20 1512  01/18/20 1525 01/19/20 0338 01/19/20 0444 01/19/20 1323 01/19/20 1641 01/19/20 1652 01/18/2020 0308 01/19/2020 0310  NA 137   < >  --    < > 136 136   < > 135   < > 137 135 137 135 136  K 3.6   < >  --    < > 3.9 4.5   < > 4.0   < > 4.2 4.1 4.2 4.2 4.1  CL 104   < >  --    < > 104 102  --  103  --   --  103  --  102  --   CO2 24   < >  --    < > 24 24  --  23  --   --  22  --  22  --   GLUCOSE 153*   < >  --    < > 158* 123*  --  127*  --   --  156*  --  139*  --   BUN 32*   < >  --    < > 28* 27*  --  29*  --   --  32*  --  30*  --   CREATININE 1.85*   < >  --    < > 1.75* 1.73*  --  1.87*  --   --  1.91*  --  1.81*  --   CALCIUM 8.1*   < >  --    < > 8.2* 8.2*  --  8.1*  --   --  7.9*  --  8.1*  --   MG 1.6*  --  2.1  --  2.3  --   --  2.4  --   --   --   --  2.4  --   PHOS 3.7   < >  --    < > 2.8 2.9  --  2.8  --   --  2.4*  --  2.6  --    < > = values in this interval not displayed.   GFR: Estimated Creatinine Clearance: 65.5 mL/min (A) (by C-G formula based on SCr of 1.81 mg/dL (H)). Recent Labs  Lab 01/14/20 0406 01/14/20 1700 01/15/20 0100 01/15/20 0513 01/17/20 0325 01/17/20 0806 01/17/20 1525 01/18/20 0355 01/18/20 0430 01/18/20 1517 01/19/20 0338 01/14/2020 0308  PROCALCITON 13.46  --  8.65  --   --   --   --   --   --   --   --   --   WBC 17.0*   < > 19.0*   < >  --    < > 16.8*  --  19.8*  --  18.9* 21.2*  LATICACIDVEN 1.2   < >  --    < > 0.9  --  0.7 0.7  --  0.7  --   --    < > = values in this interval not displayed.    Liver Function Tests: Recent Labs  Lab 01/16/20 0349 01/16/20 1634 01/17/20 0324 01/17/20 0326 01/18/20 0430 01/18/20 1512 01/19/20 0338 01/19/20 1641 01/04/2020 0308  AST 350*  --  172*  --  86*  --  59*  --  46*  ALT 335*  --  262*  --  183*  --  131*  --  89*  ALKPHOS 155*  --  168*  --  177*  --  182*  --  167*  BILITOT 1.6*  --  1.3*  --  1.4*  --  1.0  --  0.9  PROT 5.0*  --  5.5*  --  6.0*  --  6.0*  --  6.1*  ALBUMIN 2.0*    < > 2.1*   < > 2.2* 2.3* 2.2* 2.1* 2.1*   < > = values in this interval not displayed.   No results for input(s): LIPASE, AMYLASE in the last 168 hours. No results for input(s): AMMONIA in the last 168 hours.  ABG    Component Value Date/Time   PHART 7.353 01/03/2020 0310   PCO2ART 44.8 01/21/2020 0310   PO2ART 87 01/13/2020 0310   HCO3 25.2 01/08/2020 0310   TCO2 27 01/04/2020 0310   ACIDBASEDEF 1.0 01/16/2020 0310   O2SAT 97.0 01/16/2020 0310     Coagulation Profile: No results for input(s): INR, PROTIME in the last 168 hours.  Cardiac Enzymes: No results for input(s): CKTOTAL, CKMB, CKMBINDEX, TROPONINI in the last 168 hours.  HbA1C: Hemoglobin A1C  Date/Time Value Ref Range Status  12/01/2019 04:04 PM 10.8 (A) 4.0 - 5.6 % Final  02/03/2019 04:24 PM 12.5 (A) 4.0 - 5.6 % Final   Hgb A1c MFr Bld  Date/Time Value Ref Range Status  01/03/2020 03:41 PM 10.6 (H) 4.8 - 5.6 % Final    Comment:    (NOTE) Pre diabetes:          5.7%-6.4%  Diabetes:              >6.4%  Glycemic control for   <7.0% adults with diabetes   10/01/2018 04:02 PM 11.9 (H) 4.8 - 5.6 % Final    Comment:    (NOTE) Pre diabetes:          5.7%-6.4% Diabetes:              >6.4% Glycemic control for   <7.0% adults with diabetes     CBG: Recent Labs  Lab 01/09/2020 0514 01/24/2020 0614 01/07/2020 0701 01/19/2020 0807 01/21/2020 0903  GLUCAP 121* 134* 137* 120* 128*    Assessment & Plan:  Acute hypoxic respiratory failure -Continue with current related support, PRVC 6 cc/kg.  Not in a position or hemodynamically stable enough to consider spontaneous breathing trials -Continue daily wake-up assessments, continue current level of sedation but added low dose versed for amnestic effects -no new imaging, will repeat in am Cardiogenic shock Acute on chronic systolic heart failure requiring mechanical cardiac support with Impala and titration of inotropes Severe coronary artery disease status post CABG,  complicated by cardiac tamponade, post hematoma removal in OR x 2 -Continue pressors, inotropes as ordered, weaning as able -Continue nitric oxide 20 ppm for now, consider weaning per CTS. -on impella at  p7 - remains on crrt with UF  200-300 negative as long as can maintain map >65 with support Afib:  -amio gtt   Bipolar affective Diabetes type 2 with hyperglycemia -insulin infusion -a1c 10.6 Paroxysmal atrial fibrillation requiring amiodarone infusion.  Acute kidney injury-improving cardiorenal syndrome  Acute blood loss anemia -Volume removal still an issue.  Remains on crrt -On empiric antibiotics -Following BMP, electrolytes -Following CBC, goal hemoglobin 8.0 -on heparin gtt thru purge  Transaminitis:  -improving    Daily Goals Checklist  Pain/Anxiety/Delirium protocol (if indicated): versed and fentanyl infusions.  VAP protocol (if indicated): per protocol DVT prophylaxis: SCD GI prophylaxis: Protonix Glucose control: insulin infusion Code Status: Full code Family Communication: Per primary team but able to update wife at bedside re: vent Disposition: ICU     Critical care time: The patient is critically ill with multiple organ systems failure and requires high complexity decision making for assessment and support, frequent evaluation and titration of therapies, application of advanced monitoring technologies and extensive interpretation of multiple databases.  Critical care time 36 mins. This represents my time independent of the NPs time taking care of the pt. This is excluding procedures.    Audria Nine DO [facility] 01/24/2020, 11:10 AM

## 2020-01-20 NOTE — Anesthesia Procedure Notes (Signed)
Central Venous Catheter Insertion Performed by: Lillia Abed, MD, anesthesiologist Start/End12/18/2021 1:40 PM, 01/20/2020 1:50 PM Preanesthetic checklist: patient identified, IV checked, risks and benefits discussed, surgical consent, monitors and equipment checked, pre-op evaluation, timeout performed and anesthesia consent Hand hygiene performed  and maximum sterile barriers used  PA cath was placed.Swan type:thermodilution Procedure performed without using ultrasound guided technique. Attempts: 1 Post procedure assessment: no air and free fluid flow  Patient tolerated the procedure well with no immediate complications.

## 2020-01-20 NOTE — Progress Notes (Signed)
Patient ID: JERMICHAEL BELMARES, male   DOB: [birth year], 49 y.o.   MRN: [id number]  S:  CRRT running well.  Able to UF 3 liters overnight - minimal UOP  O:BP 94/63   Pulse 86   Temp (!) 96.62 F (35.9 C)   Resp (!) 27   Ht '6\' 3"'  (1.905 m)   Wt 114 kg   SpO2 100%   BMI 31.41 kg/m   Intake/Output Summary (Last 24 hours) at 01/24/2020 0724 Last data filed at 01/12/2020 0700 Gross per 24 hour  Intake 5443.42 ml  Output 8533 ml  Net -3089.58 ml   Intake/Output: I/O last 3 completed shifts: In: 7930.7 [I.V.:4818.5; Other:300.9; NG/GT:2020; IV Piggyback:791.4] Out: 56861 [Urine:67; UOHFG:90211; Stool:500; Chest Tube:268]  Intake/Output this shift:  No intake/output data recorded. Weight change: -1.6 kg Gen: intubated and sedated CVS: RRR, open sternal wound Resp: cta Abd: +BS, soft, NT/ND Ext: 2+ edema to gravity dependent areas  Recent Labs  Lab 01/28/2020 1022 01/01/2020 1026 01/14/20 1700 01/14/20 1722 01/16/20 0349 01/16/20 0427 01/17/20 0324 01/17/20 0326 01/17/20 1525 01/17/20 1532 01/18/20 0430 01/18/20 1512 01/18/20 1525 01/19/20 0338 01/19/20 0444 01/19/20 1323 01/19/20 1641 01/19/20 1652 01/11/2020 0308 01/19/2020 0310  NA  --    < > 136   < > 138   < >  --  137 136   < > 136 136   < > 135 136 137 135 137 135 136  K  --    < > 3.9   < > 3.6   < >  --  3.7 3.8   < > 3.9 4.5   < > 4.0 4.1 4.2 4.1 4.2 4.2 4.1  CL  --    < > 104   < > 104   < >  --  101 102  --  104 102  --  103  --   --  103  --  102  --   CO2  --    < > 23   < > 24   < >  --  24 25  --  24 24  --  23  --   --  22  --  22  --   GLUCOSE  --    < > 121*   < > 161*   < >  --  160* 149*  --  158* 123*  --  127*  --   --  156*  --  139*  --   BUN  --    < > 27*   < > 36*   < >  --  33* 30*  --  28* 27*  --  29*  --   --  32*  --  30*  --   CREATININE  --    < > 1.70*   < > 2.02*   < >  --  1.74* 1.66*  --  1.75* 1.73*  --  1.87*  --   --  1.91*  --  1.81*  --   ALBUMIN 2.4*  --  2.3*  --  2.0*   < > 2.1*  2.0* 2.1*  --  2.2* 2.3*  --  2.2*  --   --  2.1*  --  2.1*  --   CALCIUM  --    < > 8.2*   < > 8.0*   < >  --  8.0* 7.8*  --  8.2* 8.2*  --  8.1*  --   --  7.9*  --  8.1*  --   PHOS  --   --  4.3   < >  --    < >  --  3.1 2.7  --  2.8 2.9  --  2.8  --   --  2.4*  --  2.6  --   AST 173*  --  264*  --  350*  --  172*  --   --   --  86*  --   --  59*  --   --   --   --  46*  --   ALT 85*  --  171*  --  335*  --  262*  --   --   --  183*  --   --  131*  --   --   --   --  89*  --    < > = values in this interval not displayed.   Liver Function Tests: Recent Labs  Lab 01/18/20 0430 01/18/20 1512 01/19/20 0338 01/19/20 1641 01/11/2020 0308  AST 86*  --  59*  --  46*  ALT 183*  --  131*  --  89*  ALKPHOS 177*  --  182*  --  167*  BILITOT 1.4*  --  1.0  --  0.9  PROT 6.0*  --  6.0*  --  6.1*  ALBUMIN 2.2*   < > 2.2* 2.1* 2.1*   < > = values in this interval not displayed.   No results for input(s): LIPASE, AMYLASE in the last 168 hours. No results for input(s): AMMONIA in the last 168 hours. CBC: Recent Labs  Lab 01/17/20 0806 01/17/20 1525 01/17/20 1532 01/18/20 0430 01/18/20 1525 01/19/20 0338 01/19/20 0444 01/19/20 1652 01/07/2020 0308 01/08/2020 0310  WBC 19.8* 16.8*  --  19.8*  --  18.9*  --   --  21.2*  --   NEUTROABS  --   --   --  15.3*  --  14.5*  --   --  16.2*  --   HGB 9.1* 8.7*   < > 9.0*   < > 9.0*   < > 10.2* 8.8* 9.5*  HCT 28.6* 27.2*   < > 28.8*   < > 28.1*   < > 30.0* 28.9* 28.0*  MCV 90.5 90.7  --  92.0  --  92.4  --   --  94.1  --   PLT 222 207  --  231  --  279  --   --  280  --    < > = values in this interval not displayed.   Cardiac Enzymes: No results for input(s): CKTOTAL, CKMB, CKMBINDEX, TROPONINI in the last 168 hours. CBG: Recent Labs  Lab 01/23/2020 0306 01/16/2020 0416 01/24/2020 0514 01/24/2020 0614 12/31/2019 0701  GLUCAP 130* 123* 121* 134* 137*    Iron Studies: No results for input(s): IRON, TIBC, TRANSFERRIN, FERRITIN in the last 72  hours. Studies/Results: DG CHEST PORT 1 VIEW  Result Date: 01/11/2020 CLINICAL DATA:  Open-heart surgery, open sternum EXAM: PORTABLE CHEST 1 VIEW COMPARISON:  Radiograph 01/18/2020, CT 10/14/2011 FINDINGS: *Endotracheal tube in stable position within the mid trachea approximately 3 cm from the carina. *Transesophageal tube tip terminates below the GE junction, beyond the margins of imaging. *Right IJ approach Swan-Ganz catheter. Tip approximating the right pulmonary artery. *Right upper extremity arterial line with Impella device overlying the cardiac silhouette. Skin staples in over the right chest wall may be related  to cut down. *Left chest wall battery pack with stable defibrillator lead towards the cardiac apex. *Mediastinal and left pleural drain in place. *Additional external support devices overlie the patient. Persistently low lung volumes with atelectatic changes and more hazy interstitial opacities in the mid to lower lungs likely reflecting edema. Some persistent coalescent retrocardiac opacity is also unchanged from prior. No pneumothorax. Suspect bilateral effusions, left greater than right. Sternal diastasis. IMPRESSION: 1. Lines and tubes as above. 2. Persistently low lung volumes with atelectatic changes, edema and more coalescent airspace opacity in the retrocardiac space. Electronically Signed   By: Lovena Le M.D.   On: 01/21/2020 06:40   ECHOCARDIOGRAM LIMITED  Result Date: 01/19/2020    ECHOCARDIOGRAM LIMITED REPORT   Patient Name:   STEFFAN CANIGLIA Date of Exam: 01/19/2020 Medical Rec #:  [id number]     Height:       75.0 in Accession #:    [id number]    Weight:       257.5 lb Date of Birth:  01/17/1971     BSA:          2.443 m Patient Age:    18 years      BP:           64/54 mmHg Patient Gender: M             HR:           114 bpm. Exam Location:  Inpatient Procedure: Limited Echo, Color Doppler and Cardiac Doppler Indications:    I50.9* Heart failure (unspecified)  History:         Patient has prior history of Echocardiogram examinations, most                 recent 01/18/2020. CHF, Prior CABG and Pacemaker,                 Arrythmias:Atrial Fibrillation; Risk Factors:Hypertension,                 Diabetes, Dyslipidemia and Sleep Apnea. Impella 5.5 placed                 12/30/2019.  Sonographer:    Raquel Sarna Senior RDCS Referring Phys: [id number] Carlene Coria  Sonographer Comments: Limited to evaluate RV function, Adam (Impella rep) present for exam. IMPRESSIONS  1. RV size appears normal but there is severely reduced RV function. Severe RV dysfunction was present on the prior study. Right ventricular systolic function is severely reduced. The right ventricular size is normal.  2. Impella present in the LV, 5.6 cm from the AoV. The LV is dilated with septal bowing into the RV. Trace MR. Left ventricular ejection fraction, by estimation, is 10-15%. The left ventricle demonstrates global hypokinesis. The left ventricular internal cavity size was moderately dilated.  3. Left atrial size was mildly dilated.  4. The mitral valve is grossly normal. Trivial mitral valve regurgitation. No evidence of mitral stenosis. Comparison(s): No significant change from prior study. FINDINGS  Left Ventricle: Impella present in the LV, 5.6 cm from the AoV. The LV is dilated with septal bowing into the RV. Trace MR. Left ventricular ejection fraction, by estimation, is 10-15%. The left ventricle demonstrates global hypokinesis. The left ventricular internal cavity size was moderately dilated. Right Ventricle: RV size appears normal but there is severely reduced RV function. Severe RV dysfunction was present on the prior study. The right ventricular size is normal. Right ventricular systolic function is severely reduced. Left Atrium: Left  atrial size was mildly dilated. Right Atrium: Right atrial size was normal in size. Mitral Valve: The mitral valve is grossly normal. Trivial mitral valve regurgitation. No evidence of  mitral valve stenosis. Tricuspid Valve: The tricuspid valve is grossly normal. Tricuspid valve regurgitation is trivial. No evidence of tricuspid stenosis. Venous: IVC assessment for right atrial pressure unable to be performed due to mechanical ventilation. Additional Comments: A pacer wire is visualized in the right atrium and right ventricle. RIGHT VENTRICLE RV S prime:     4.58 cm/s TAPSE (M-mode): 0.6 cm TRICUSPID VALVE TR Peak grad:   13.4 mmHg TR Vmax:        183.00 cm/s Eleonore Chiquito MD Electronically signed by Eleonore Chiquito MD Signature Date/Time: 01/19/2020/11:53:14 AM    Final    ECHOCARDIOGRAM LIMITED  Result Date: 01/18/2020    ECHOCARDIOGRAM LIMITED REPORT   Patient Name:   AIKEN WITHEM Date of Exam: 01/18/2020 Medical Rec #:  [id number]     Height:       75.0 in Accession #:    [id number]    Weight:       257.9 lb Date of Birth:  1970/09/19     BSA:          2.445 m Patient Age:    82 years      BP:           93/67 mmHg Patient Gender: M             HR:           115 bpm. Exam Location:  Inpatient Procedure: Limited Echo Indications:    CHF, Impella position  History:        Patient has prior history of Echocardiogram examinations, most                 recent 01/14/2020. CHF, CAD, Prior CABG and Defibrillator;                 Arrythmias:Atrial Fibrillation. Impella, Cardiogenic shock.  Sonographer:    Dustin Flock Referring Phys: [phone] Wilmer Floor SIMMONS  Sonographer Comments: Impella positioning. IMPRESSIONS  1. Limited echo for Impella positioning. Initially the device was noted to be too far advanced in the LV, measuring 6.65 cm distal to the aortic valve near the posteromedial papillary muscle. It was repositioned to a final measurement of 5.5 cm distal to the aortic valve in the 2 chamber view (ideal position 3.5 cm). Left ventricular ejection fraction, by estimation, is <20%. The left ventricle has severely decreased function. The left ventricle demonstrates global hypokinesis.  Comparison(s): Changes from prior study are noted. 01/14/2020: LVEF <20%, impella position 4.6 cm distal to the aortic cusps. FINDINGS  Left Ventricle: Limited echo for Impella positioning. Initially the device was noted to be too far advanced in the LV, measuring 6.65 cm distal to the aortic valve near the posteromedial papillary muscle. It was repositioned to a final measurement of 5.5 cm distal to the aortic valve in the 2 chamber view (ideal position 3.5 cm). Left ventricular ejection fraction, by estimation, is <20%. The left ventricle has severely decreased function. The left ventricle demonstrates global hypokinesis. Lyman Bishop MD Electronically signed by Lyman Bishop MD Signature Date/Time: 01/18/2020/10:47:59 AM    Final    . aspirin  324 mg Per Tube Daily  . atorvastatin  80 mg Per Tube Daily  . B-complex with vitamin C  1 tablet Per Tube Daily  . busPIRone  10 mg Per Tube  TID  . chlorhexidine gluconate (MEDLINE KIT)  15 mL Mouth Rinse BID  . Chlorhexidine Gluconate Cloth  6 each Topical Daily  . feeding supplement (PIVOT 1.5 CAL)  1,000 mL Per Tube Q24H  . FLUoxetine  40 mg Per Tube BID  . lipase/protease/amylase)  20,880 Units Per Tube Once   And  . sodium bicarbonate  650 mg Per Tube Once  . mouth rinse  15 mL Mouth Rinse 10 times per day  . nystatin  5 mL Oral QID  . pantoprazole sodium  40 mg Per Tube Daily  . sodium chloride flush  10-40 mL Intracatheter Q12H  . sodium chloride flush  3 mL Intravenous Q12H  . valproic acid  250 mg Per Tube BID    BMET    Component Value Date/Time   NA 136 12/28/2019 0310   NA 138 09/08/2018 0924   K 4.1 01/24/2020 0310   CL 102 01/24/2020 0308   CO2 22 01/01/2020 0308   GLUCOSE 139 (H) 01/24/2020 0308   BUN 30 (H) 01/08/2020 0308   BUN 23 09/08/2018 0924   CREATININE 1.81 (H) 12/28/2019 0308   CREATININE 1.13 05/13/2013 1533   CALCIUM 8.1 (L) 12/30/2019 0308   GFRNONAA 45 (L) 01/23/2020 0308   GFRNONAA 79 05/13/2013 1533    GFRAA >60 03/08/2019 1515   GFRAA >89 05/13/2013 1533   CBC    Component Value Date/Time   WBC 21.2 (H) 01/06/2020 0308   RBC 3.07 (L) 01/11/2020 0308   HGB 9.5 (L) 01/07/2020 0310   HGB 16.2 03/23/2018 1535   HCT 28.0 (L) 01/26/2020 0310   HCT 47.0 03/23/2018 1535   PLT 280 01/19/2020 0308   PLT 251 03/23/2018 1535   MCV 94.1 01/18/2020 0308   MCV 82 03/23/2018 1535   MCH 28.7 01/27/2020 0308   MCHC 30.4 01/07/2020 0308   RDW 20.7 (H) 01/26/2020 0308   RDW 15.0 03/23/2018 1535   LYMPHSABS 1.1 01/10/2020 0308   LYMPHSABS 2.5 03/23/2018 1535   MONOABS 2.2 (H) 01/14/2020 0308   EOSABS 0.5 01/04/2020 0308   EOSABS 0.2 03/23/2018 1535   BASOSABS 0.1 01/24/2020 0308   BASOSABS 0.0 03/23/2018 1535     Assessment/Plan:  1. AKI/CKD stage III- presumably due to ischemic ATN in setting of cardiogenic shock s/p CABG. Initially with good response with lasix drip, however starting to have decrease in UOP so CRRT started 01/16/20. All fluids 4K/2.5Ca: Pre-filter 500 ml/hr, post filter 200 ml/hr, dialysate 1500 ml/hr. UF goal 100-200 however still with significant edema and no UOP.  Will increase goal  as tolerated to 200-300 but keep MAP >65 2. No heparin due to hematoma  3. Right femoral HD catheter placed 01/16/20  1. CAD s/p CABG complicated by cardiogenic shock and post-op hematoma. Currently with an open chest and awaiting improvement of volume status for secondary closure 2. Cardiogenic shock- underlying familial cardiomyopathy s/p redo CABG. Remains on pressors: Levophed, epinephrine, and vasopressin. Impella and milrinone.  1. Tolerating UF with CRRT and goal is 100-200 ml/hr.   2. Down 17 kg from peak volume but still above goal edw for chest closure. 3. Cont with UF of 200 ml/hr unless he requires more pressor support, then decrease to 100 ml/hr. 3. ABLA- s/p postoperative bleeding into chest cavity s/p re-exploration for tamponade/hematoma. CT surgery following.   4. Hypophosphatemia- due to CRRT.  Repleted with K phos at some point-  Not critical- watch for now 5. Atrial fibrillation - s/p DCCV on  12/16 6. VDRF- per primary svc 7. ID - rising WBC. On vancomycin and meropenem.   Louis Meckel  [employer] [phone]

## 2020-01-20 NOTE — Progress Notes (Addendum)
Patient ID: Corey Palmer, male   DOB: 1970/11/20, 49 y.o.   MRN: [id number]     Advanced Heart Failure Rounding Note  PCP-Cardiologist: No primary care provider on file.   Subjective:    - 12/7 Torsades -ICD shock x1.  - 12/11 Impella 5.5 placed.  - 12/12 Impella repositioned in OR - 12/15 CABG x 4 with LIMA-LAD, SVG-PDA/PLV, SVG-OM - 12/16 DCCV afib.  Back to OR to re-open chest and again to evacuate hematoma.  - 12/19 CVVH initiated   Remains intubated/sedated. Chest open. Impella at P-7. On NE 24. Epi 6, VP 0.04, milrinone 0.125 and iNO 21.    Remains anuric, 22 cc in UOP yesterday. Continues on CVVH for volume removal. Pulling 200/hr. Additional 7.8 L removed yesterday. Wt continues to trend down. CVP 12-13.   Going back to OR for chest closure today    Swan numbers: CVP 12-13 PA 36/23 (26) CO/CI 5.53/2.39 Co-ox 51% PAPi 1.0   Impella 5.5 P-7 Flow 4.4 Heparin only in purge   Echo (12/10): EF <20%, moderate LV dilation, normal RV size with mildly decreased systolic function.    Objective:   Weight Range: 114 kg Body mass index is 31.41 kg/m.   Vital Signs:   Temp:  [96.44 F (35.8 C)-99.5 F (37.5 C)] 96.8 F (36 C) (12/23 0815) Pulse Rate:  [40-126] 84 (12/23 0815) Resp:  [17-33] 25 (12/23 0815) BP: (83-95)/(63-67) 94/63 (12/23 0330) SpO2:  [86 %-100 %] 97 % (12/23 0815) Arterial Line BP: (60-91)/(53-68) 72/67 (12/23 0815) FiO2 (%):  [40 %-50 %] 40 % (12/23 0744) Weight:  [114 kg-115.2 kg] 114 kg (12/23 0500) Last BM Date: 01/07/2020  Weight change: Filed Weights   01/19/20 0530 01/19/20 1000 12/30/2019 0500  Weight: 116.8 kg 115.2 kg 114 kg    Intake/Output:   Intake/Output Summary (Last 24 hours) at 01/03/2020 0847 Last data filed at 01/04/2020 0800 Gross per 24 hour  Intake 5369.69 ml  Output 8592 ml  Net -3222.31 ml      Physical Exam   CVP 12-13 General:  Critically ill male, open chest, intubated and sedated HEENT: + ETT  normal Neck: supple.  JVD 12 cm. Carotids 2+ bilat; no bruits. No lymphadenopathy or thyromegaly appreciated. + left axillary impella  Cor: Open chest. + CTs. PMI nondisplaced. Regular rate & rhythm. No rubs, gallops or murmurs. Lungs: intubated and course Abdomen: soft, nontender, nondistended. No hepatosplenomegaly. No bruits or masses. Good bowel sounds. Extremities: no cyanosis, clubbing, rash, 2+ bilateral LE up to thighs  Neuro: intubated and sedated    Telemetry    Afib 110s  Personally reviewed   Labs    CBC Recent Labs    01/19/20 0338 01/19/20 0444 01/22/2020 0308 01/07/2020 0310  WBC 18.9*  --  21.2*  --   NEUTROABS 14.5*  --  16.2*  --   HGB 9.0*   < > 8.8* 9.5*  HCT 28.1*   < > 28.9* 28.0*  MCV 92.4  --  94.1  --   PLT 279  --  280  --    < > = values in this interval not displayed.   Basic Metabolic Panel Recent Labs    01/19/20 0338 01/19/20 0444 01/19/20 1641 01/19/20 1652 01/23/2020 0308 01/10/2020 0310  NA 135   < > 135   < > 135 136  K 4.0   < > 4.1   < > 4.2 4.1  CL 103  --  103  --  102  --   CO2 23  --  22  --  22  --   GLUCOSE 127*  --  156*  --  139*  --   BUN 29*  --  32*  --  30*  --   CREATININE 1.87*  --  1.91*  --  1.81*  --   CALCIUM 8.1*  --  7.9*  --  8.1*  --   MG 2.4  --   --   --  2.4  --   PHOS 2.8  --  2.4*  --  2.6  --    < > = values in this interval not displayed.   Liver Function Tests Recent Labs    01/19/20 0338 01/19/20 1641 01/02/2020 0308  AST 59*  --  46*  ALT 131*  --  89*  ALKPHOS 182*  --  167*  BILITOT 1.0  --  0.9  PROT 6.0*  --  6.1*  ALBUMIN 2.2* 2.1* 2.1*   No results for input(s): LIPASE, AMYLASE in the last 72 hours. Cardiac Enzymes No results for input(s): CKTOTAL, CKMB, CKMBINDEX, TROPONINI in the last 72 hours.  BNP: BNP (last 3 results) Recent Labs    01/01/2020 1619  BNP 577.5*    ProBNP (last 3 results) No results for input(s): PROBNP in the last 8760 hours.   D-Dimer No results for  input(s): DDIMER in the last 72 hours. Hemoglobin A1C No results for input(s): HGBA1C in the last 72 hours. Fasting Lipid Panel No results for input(s): CHOL, HDL, LDLCALC, TRIG, CHOLHDL, LDLDIRECT in the last 72 hours. Thyroid Function Tests No results for input(s): TSH, T4TOTAL, T3FREE, THYROIDAB in the last 72 hours.  Invalid input(s): FREET3  Other results:   Imaging    DG CHEST PORT 1 VIEW  Result Date: 01/04/2020 CLINICAL DATA:  Open-heart surgery, open sternum EXAM: PORTABLE CHEST 1 VIEW COMPARISON:  Radiograph 01/18/2020, CT 10/14/2011 FINDINGS: *Endotracheal tube in stable position within the mid trachea approximately 3 cm from the carina. *Transesophageal tube tip terminates below the GE junction, beyond the margins of imaging. *Right IJ approach Swan-Ganz catheter. Tip approximating the right pulmonary artery. *Right upper extremity arterial line with Impella device overlying the cardiac silhouette. Skin staples in over the right chest wall may be related to cut down. *Left chest wall battery pack with stable defibrillator lead towards the cardiac apex. *Mediastinal and left pleural drain in place. *Additional external support devices overlie the patient. Persistently low lung volumes with atelectatic changes and more hazy interstitial opacities in the mid to lower lungs likely reflecting edema. Some persistent coalescent retrocardiac opacity is also unchanged from prior. No pneumothorax. Suspect bilateral effusions, left greater than right. Sternal diastasis. IMPRESSION: 1. Lines and tubes as above. 2. Persistently low lung volumes with atelectatic changes, edema and more coalescent airspace opacity in the retrocardiac space. Electronically Signed   By: Lovena Le M.D.   On: 01/02/2020 06:40   ECHOCARDIOGRAM LIMITED  Result Date: 01/19/2020    ECHOCARDIOGRAM LIMITED REPORT   [name] Date of Exam: 01/19/2020 Medical Rec #:  [id number]     Height:       75.0  in Accession #:    [id number]    Weight:       257.5 lb Date of Birth:  12/22/1970     BSA:          2.443 m Patient Age:    49 years  BP:           64/54 mmHg Patient Gender: M             HR:           114 bpm. Exam Location:  Inpatient Procedure: Limited Echo, Color Doppler and Cardiac Doppler Indications:    I50.9* Heart failure (unspecified)  History:        Patient has prior history of Echocardiogram examinations, most                 recent 01/18/2020. CHF, Prior CABG and Pacemaker,                 Arrythmias:Atrial Fibrillation; Risk Factors:Hypertension,                 Diabetes, Dyslipidemia and Sleep Apnea. Impella 5.5 placed                 01/06/2020.  Sonographer:    Raquel Sarna Senior RDCS Referring Phys: [id number] Carlene Coria  Sonographer Comments: Limited to evaluate RV function, Adam (Impella rep) present for exam. IMPRESSIONS  1. RV size appears normal but there is severely reduced RV function. Severe RV dysfunction was present on the prior study. Right ventricular systolic function is severely reduced. The right ventricular size is normal.  2. Impella present in the LV, 5.6 cm from the AoV. The LV is dilated with septal bowing into the RV. Trace MR. Left ventricular ejection fraction, by estimation, is 10-15%. The left ventricle demonstrates global hypokinesis. The left ventricular internal cavity size was moderately dilated.  3. Left atrial size was mildly dilated.  4. The mitral valve is grossly normal. Trivial mitral valve regurgitation. No evidence of mitral stenosis. Comparison(s): No significant change from prior study. FINDINGS  Left Ventricle: Impella present in the LV, 5.6 cm from the AoV. The LV is dilated with septal bowing into the RV. Trace MR. Left ventricular ejection fraction, by estimation, is 10-15%. The left ventricle demonstrates global hypokinesis. The left ventricular internal cavity size was moderately dilated. Right Ventricle: RV size appears normal but there is severely  reduced RV function. Severe RV dysfunction was present on the prior study. The right ventricular size is normal. Right ventricular systolic function is severely reduced. Left Atrium: Left atrial size was mildly dilated. Right Atrium: Right atrial size was normal in size. Mitral Valve: The mitral valve is grossly normal. Trivial mitral valve regurgitation. No evidence of mitral valve stenosis. Tricuspid Valve: The tricuspid valve is grossly normal. Tricuspid valve regurgitation is trivial. No evidence of tricuspid stenosis. Venous: IVC assessment for right atrial pressure unable to be performed due to mechanical ventilation. Additional Comments: A pacer wire is visualized in the right atrium and right ventricle. RIGHT VENTRICLE RV S prime:     4.58 cm/s TAPSE (M-mode): 0.6 cm TRICUSPID VALVE TR Peak grad:   13.4 mmHg TR Vmax:        183.00 cm/s Eleonore Chiquito MD Electronically signed by Eleonore Chiquito MD Signature Date/Time: 01/19/2020/11:53:14 AM    Final      Medications:     Scheduled Medications: . aspirin  324 mg Per Tube Daily  . atorvastatin  80 mg Per Tube Daily  . B-complex with vitamin C  1 tablet Per Tube Daily  . busPIRone  10 mg Per Tube TID  . chlorhexidine gluconate (MEDLINE KIT)  15 mL Mouth Rinse BID  . Chlorhexidine Gluconate Cloth  6 each Topical Daily  . feeding supplement (  PIVOT 1.5 CAL)  1,000 mL Per Tube Q24H  . FLUoxetine  40 mg Per Tube BID  . lipase/protease/amylase)  20,880 Units Per Tube Once   And  . sodium bicarbonate  650 mg Per Tube Once  . mouth rinse  15 mL Mouth Rinse 10 times per day  . nystatin  5 mL Oral QID  . pantoprazole sodium  40 mg Per Tube Daily  . sodium chloride flush  10-40 mL Intracatheter Q12H  . sodium chloride flush  3 mL Intravenous Q12H  . valproic acid  250 mg Per Tube BID    Infusions: .  prismasol BGK 4/2.5 500 mL/hr at 12/28/2019 0134  .  prismasol BGK 4/2.5 200 mL/hr at 01/19/20 1602  . sodium chloride Stopped (01/03/2020 1646)  .  sodium chloride 10 mL/hr at 01/14/20 2101  . sodium chloride Stopped (01/16/20 1032)  . amiodarone 60 mg/hr (01/11/2020 0800)  . epinephrine 6 mcg/min (01/10/2020 0800)  . fentaNYL infusion INTRAVENOUS 150 mcg/hr (12/30/2019 0800)  . impella catheter heparin 50 unit/mL in dextrose 5%    . insulin 3.6 mL/hr at 01/28/2020 0800  . lactated ringers 20 mL/hr at 01/07/2020 0800  . meropenem (MERREM) IV Stopped (12/30/2019 3546)  . midazolam 4 mg/hr (01/10/2020 0800)  . milrinone 0.125 mcg/kg/min (01/11/2020 0800)  . norepinephrine (LEVOPHED) Adult infusion 24 mcg/min (01/19/2020 0800)  . prismasol BGK 4/2.5 1,500 mL/hr at 01/28/2020 0551  . vancomycin Stopped (01/19/20 1806)  . vasopressin 0.04 Units/min (01/08/2020 0800)    PRN Medications: sodium chloride, sodium chloride, bisacodyl **OR** bisacodyl, dextrose, docusate, fentaNYL, fentaNYL (SUBLIMAZE) injection, fentaNYL (SUBLIMAZE) injection, heparin, heparin, metoprolol tartrate, midazolam, midazolam, midazolam, ondansetron (ZOFRAN) IV, oxyCODONE, polyethylene glycol, sodium chloride flush, sodium chloride flush     Assessment/Plan   1. Cardiogenic shock/acute on chronic systolic CHF: Initially nonischemic cardiomyopathy. Possible familial cardiomyopathy as both parents had cardiomyopathy and died at around 28.However, [employer] gene testing did not show any common mutation for cardiomyopathy.  However, this admission noted to have severe 3 vessel disease so suspect component of ischemic cardiomyopathy. [employer] Jude ICD. Echo in 8/20 with EF 15% and mildly decreased RV function. Echo this admission with EF < 20%, moderate LV dilation, RV mildly reduced, severe LAE, no significant MR.Low output HF with markedly low EF.  He has a long history of cardiomyopathy (20 yrs), tends to minimize symptoms.  Cardiorenal syndrome with creatinine up to 2.2,  stabilized on milrinone and Impella 5.5. SCr 1.44 day of CABG. CABG 12/15.  Post-op shock, back to OR 12/16 to open chest  (chest wall compartment syndrome) and again to evacuate hematoma.  TEE post-op with severe RV dysfunction.  CVVHD initiated 12/19 for volume removal in setting of rising Scr/decreased UOP. Suspect ATN  - Plan return to OR today for chest closure  - Continue CVVHD for fluid removal  - Need to keep renal perfusion high. Continue Impella at P-7. Will try to wean after chest closure  - Increase NE as needed to keep MAPs > 70 consistently and closer to 80-90 - LDH 412 -> 468 -> 311->339->408->479->466 - Continue EPI, NE, VP and Milrinone  2. Atrial fibrillation: H/o PAF.  He was on dofetilide in the remote past but this was stopped due to noncompliance. He had an upper GI bleed from antral ulcers in 3/12. He was seen by GI and was deemed safe to restart anticoagulation as long as he remains on a PPI.  No apparent recurrence of AF until just prior to  this admission, was cardioverted back to NSR in ER but went back into atrial fibrillation.  Post-op afib, DCCV to NSR/BiV pacing am 12/16, now back in atrial fibrillation Remains in AF - Continue amiodarone at 60 mg/hr. - Would not try to cardiovert again until back on heparin gtt and vasoactive meds weaned.  - Off heparin post-op with bleeding, only getting heparin in Impella purge currently.  Restart systemic heparin gtt when surgery thinks safe to do so. No change - Unable to do Maze with CABG.    3. CAD:   Cath this admission with severe 3 vessel disease, ideally treated by CABG given extent of disease. CABG x 4 on 12/15.  - Continue atorvastatin, ASA.  - continue current management 4. Acute on chronic hypoxemic respiratory failure: Pulmonary edema, vent per CCM.  - Oxygenation worse in setting of worsening volume overload. Continue CVVHD. - CCM following 5. AKI on CKD stage ?3: Likely ATN/cardiorenal - SCr 1.74>>1.85>>2.02>>iniation of CVVH>>1.78>>1.74->1.75->1.87->1.81 - oliguric, only 22 cc in UOP yesterday  - Continue CVVHD per Renal. Appreciate  their help.  6. Diabetes: Insulin.  7. Hyponatremia: Resolved.  8. Torsades/ICD shock: 12/7/212 in hospital event, in setting of severe hypokalemia and hypomagnesemia.   9. Gout: h/o gout. Complained of rt knee pain c/w previous flares - treated w/ prednisone burst (completed).  10: ID: Tm 99.3 PCT 13.46 -> 8.65  Cultures NGTD.  - Covering with vancomycin/meropenem (PCN allergy).     Lyda Jester [facility]  01/07/2020 8:47 AM   Agree with above.   Remains on full support with multiple pressors/inotropes and Impella. Chest open. Hemodynamics stable but marginal. RV function appears poor on echo. No evidence of obvious external compression on echo. Continues to pull on CVVHD. Weight down 36 pounds this week but still up 20. Tmax 99.5 on vanc/mero  General:  Ill appearing. Sedated on vent HEENT: +ETT Neck: supple. RIJ swan  Carotids 2+ bilat; no bruits. No lymphadenopathy or thryomegaly appreciated. Cor: chest open. Dressing ok. Irregular + CTs Lungs: coarse Abdomen: obese soft, nontender, nondistended. No hepatosplenomegaly. No bruits or masses. Good bowel sounds. Extremities: no cyanosis, clubbing, rash, 2+ edema  RFV vas cath Neuro: intubated sedated  Remains very tenuous. Volume status improving with CVVHD. D/w Dr. Prescott Gum and Dr. [name]. To OR today for washout and attempted chest closure. Continue impella and vasopressor support. Impella waveforms and position ok. Pull swan as it has been a long time. Can replace in OR or later today.   CRITICAL CARE Performed by: Glori Bickers  Total critical care time: 45 minutes  Critical care time was exclusive of separately billable procedures and treating other patients.  Critical care was necessary to treat or prevent imminent or life-threatening deterioration.  Critical care was time spent personally by me (independent of midlevel providers or residents) on the following activities: development of treatment plan with patient  and/or surrogate as well as nursing, discussions with consultants, evaluation of patient's response to treatment, examination of patient, obtaining history from patient or surrogate, ordering and performing treatments and interventions, ordering and review of laboratory studies, ordering and review of radiographic studies, pulse oximetry and re-evaluation of patient's condition.  Glori Bickers, MD  9:22 AM

## 2020-01-20 NOTE — OR Nursing (Signed)
Portable Chest X-Ray performed by rad tech to r/o pneumothorax as per Dr. Lucianne Lei Trigt's order.

## 2020-01-20 NOTE — Anesthesia Preprocedure Evaluation (Signed)
Anesthesia Evaluation  Patient identified by MRN, date of birth, ID band Patient unresponsive    Reviewed: Allergy & Precautions, NPO status , Patient's Chart, lab work & pertinent test results  Airway Mallampati: Intubated       Dental   Pulmonary sleep apnea ,    Pulmonary exam normal        Cardiovascular hypertension, Pt. on medications + CAD  Normal cardiovascular exam+ dysrhythmias Atrial Fibrillation + Cardiac Defibrillator      Neuro/Psych Anxiety Depression Bipolar Disorder    GI/Hepatic GERD  Medicated and Controlled,  Endo/Other  diabetes  Renal/GU      Musculoskeletal   Abdominal   Peds  Hematology   Anesthesia Other Findings   Reproductive/Obstetrics                             Anesthesia Physical Anesthesia Plan  ASA: IV  Anesthesia Plan: General   Post-op Pain Management:    Induction: Intravenous  PONV Risk Score and Plan: 2  Airway Management Planned: Oral ETT  Additional Equipment: PA Cath  Intra-op Plan:   Post-operative Plan: Post-operative intubation/ventilation  Informed Consent: I have reviewed the patients History and Physical, chart, labs and discussed the procedure including the risks, benefits and alternatives for the proposed anesthesia with the patient or authorized representative who has indicated his/her understanding and acceptance.       Plan Discussed with: CRNA and Surgeon  Anesthesia Plan Comments:         Anesthesia Quick Evaluation

## 2020-01-20 NOTE — Transfer of Care (Signed)
Immediate Anesthesia Transfer of Care Note  Patient: Corey Palmer  Procedure(s) Performed: STERNAL CLOSURE (N/A Chest) TRANSESOPHAGEAL ECHOCARDIOGRAM (TEE) (N/A ) APPLICATION OF WOUND VAC (N/A )  Patient Location: ICU  Anesthesia Type:General  Level of Consciousness: sedated and Patient remains intubated per anesthesia plan  Airway & Oxygen Therapy: Patient remains intubated per anesthesia plan and Patient placed on Ventilator (see vital sign flow sheet for setting)  Post-op Assessment: Report given to RN and Post -op Vital signs reviewed and stable  Post vital signs: Reviewed and stable  Last Vitals:  Vitals Value Taken Time  BP 90/68   Temp    Pulse 126 01/03/2020 1632  Resp 28 01/13/2020 1632  SpO2 100 % 01/10/2020 1632  Vitals shown include unvalidated device data.  Last Pain:  Vitals:   01/06/2020 0800  TempSrc: Core  PainSc:       Patients Stated Pain Goal: 0 (95/97/47 1855)  Complications: No complications documented.

## 2020-01-21 ENCOUNTER — Inpatient Hospital Stay (HOSPITAL_COMMUNITY): Payer: BC Managed Care – PPO

## 2020-01-21 LAB — POCT I-STAT 7, (LYTES, BLD GAS, ICA,H+H)
Acid-base deficit: 2 mmol/L (ref 0.0–2.0)
Acid-base deficit: 3 mmol/L — ABNORMAL HIGH (ref 0.0–2.0)
Acid-base deficit: 3 mmol/L — ABNORMAL HIGH (ref 0.0–2.0)
Bicarbonate: 23.5 mmol/L (ref 20.0–28.0)
Bicarbonate: 22.8 mmol/L (ref 20.0–28.0)
Bicarbonate: 23.4 mmol/L (ref 20.0–28.0)
Calcium, Ion: 1.18 mmol/L (ref 1.15–1.40)
Calcium, Ion: 1.22 mmol/L (ref 1.15–1.40)
Calcium, Ion: 1.21 mmol/L (ref 1.15–1.40)
HCT: 28 % — ABNORMAL LOW (ref 39.0–52.0)
HCT: 28 % — ABNORMAL LOW (ref 39.0–52.0)
HCT: 26 % — ABNORMAL LOW (ref 39.0–52.0)
Hemoglobin: 9.5 g/dL — ABNORMAL LOW (ref 13.0–17.0)
Hemoglobin: 9.5 g/dL — ABNORMAL LOW (ref 13.0–17.0)
Hemoglobin: 8.8 g/dL — ABNORMAL LOW (ref 13.0–17.0)
O2 Saturation: 99 %
O2 Saturation: 97 %
O2 Saturation: 98 %
Patient temperature: 36.3
Patient temperature: 36.5
Potassium: 4.1 mmol/L (ref 3.5–5.1)
Potassium: 4.4 mmol/L (ref 3.5–5.1)
Potassium: 4.3 mmol/L (ref 3.5–5.1)
Sodium: 137 mmol/L (ref 135–145)
Sodium: 134 mmol/L — ABNORMAL LOW (ref 135–145)
Sodium: 135 mmol/L (ref 135–145)
TCO2: 25 mmol/L (ref 22–32)
TCO2: 24 mmol/L (ref 22–32)
TCO2: 25 mmol/L (ref 22–32)
pCO2 arterial: 41.6 mmHg (ref 32.0–48.0)
pCO2 arterial: 40.7 mmHg (ref 32.0–48.0)
pCO2 arterial: 44.7 mmHg (ref 32.0–48.0)
pH, Arterial: 7.36 (ref 7.350–7.450)
pH, Arterial: 7.354 (ref 7.350–7.450)
pH, Arterial: 7.325 — ABNORMAL LOW (ref 7.350–7.450)
pO2, Arterial: 122 mmHg — ABNORMAL HIGH (ref 83.0–108.0)
pO2, Arterial: 88 mmHg (ref 83.0–108.0)
pO2, Arterial: 111 mmHg — ABNORMAL HIGH (ref 83.0–108.0)

## 2020-01-21 LAB — COOXEMETRY PANEL
Carboxyhemoglobin: 1.5 % (ref 0.5–1.5)
Carboxyhemoglobin: 1.4 % (ref 0.5–1.5)
Methemoglobin: 1.1 % (ref 0.0–1.5)
Methemoglobin: 1 % (ref 0.0–1.5)
O2 Saturation: 41.9 %
O2 Saturation: 37.2 %
Total hemoglobin: 8.9 g/dL — ABNORMAL LOW (ref 12.0–16.0)
Total hemoglobin: 9.8 g/dL — ABNORMAL LOW (ref 12.0–16.0)

## 2020-01-21 LAB — COMPREHENSIVE METABOLIC PANEL
ALT: 62 U/L — ABNORMAL HIGH (ref 0–44)
AST: 39 U/L (ref 15–41)
Albumin: 2.3 g/dL — ABNORMAL LOW (ref 3.5–5.0)
Alkaline Phosphatase: 134 U/L — ABNORMAL HIGH (ref 38–126)
Anion gap: 11 (ref 5–15)
BUN: 31 mg/dL — ABNORMAL HIGH (ref 6–20)
CO2: 21 mmol/L — ABNORMAL LOW (ref 22–32)
Calcium: 8.5 mg/dL — ABNORMAL LOW (ref 8.9–10.3)
Chloride: 103 mmol/L (ref 98–111)
Creatinine, Ser: 1.95 mg/dL — ABNORMAL HIGH (ref 0.61–1.24)
GFR, Estimated: 41 mL/min — ABNORMAL LOW (ref >60)
Glucose, Bld: 143 mg/dL — ABNORMAL HIGH (ref 70–99)
Potassium: 4.5 mmol/L (ref 3.5–5.1)
Sodium: 135 mmol/L (ref 135–145)
Total Bilirubin: 1.3 mg/dL — ABNORMAL HIGH (ref 0.3–1.2)
Total Protein: 6.2 g/dL — ABNORMAL LOW (ref 6.5–8.1)

## 2020-01-21 LAB — CBC WITH DIFFERENTIAL/PLATELET
Abs Immature Granulocytes: 1.02 10*3/uL — ABNORMAL HIGH (ref 0.00–0.07)
Basophils Absolute: 0.1 10*3/uL (ref 0.0–0.1)
Basophils Relative: 1 %
Eosinophils Absolute: 0.3 10*3/uL (ref 0.0–0.5)
Eosinophils Relative: 1 %
HCT: 29 % — ABNORMAL LOW (ref 39.0–52.0)
Hemoglobin: 8.7 g/dL — ABNORMAL LOW (ref 13.0–17.0)
Immature Granulocytes: 4 %
Lymphocytes Relative: 4 %
Lymphs Abs: 1 10*3/uL (ref 0.7–4.0)
MCH: 28.6 pg (ref 26.0–34.0)
MCHC: 30 g/dL (ref 30.0–36.0)
MCV: 95.4 fL (ref 80.0–100.0)
Monocytes Absolute: 2 10*3/uL — ABNORMAL HIGH (ref 0.1–1.0)
Monocytes Relative: 7 %
Neutro Abs: 24 10*3/uL — ABNORMAL HIGH (ref 1.7–7.7)
Neutrophils Relative %: 83 %
Platelets: 315 10*3/uL (ref 150–400)
RBC: 3.04 MIL/uL — ABNORMAL LOW (ref 4.22–5.81)
RDW: 21.9 % — ABNORMAL HIGH (ref 11.5–15.5)
WBC: 28.4 10*3/uL — ABNORMAL HIGH (ref 4.0–10.5)
nRBC: 1.5 % — ABNORMAL HIGH (ref 0.0–0.2)

## 2020-01-21 LAB — RENAL FUNCTION PANEL
Albumin: 2.3 g/dL — ABNORMAL LOW (ref 3.5–5.0)
Anion gap: 12 (ref 5–15)
BUN: 29 mg/dL — ABNORMAL HIGH (ref 6–20)
CO2: 21 mmol/L — ABNORMAL LOW (ref 22–32)
Calcium: 8.4 mg/dL — ABNORMAL LOW (ref 8.9–10.3)
Chloride: 101 mmol/L (ref 98–111)
Creatinine, Ser: 1.78 mg/dL — ABNORMAL HIGH (ref 0.61–1.24)
GFR, Estimated: 46 mL/min — ABNORMAL LOW (ref >60)
Glucose, Bld: 127 mg/dL — ABNORMAL HIGH (ref 70–99)
Phosphorus: 3.4 mg/dL (ref 2.5–4.6)
Potassium: 4.4 mmol/L (ref 3.5–5.1)
Sodium: 134 mmol/L — ABNORMAL LOW (ref 135–145)

## 2020-01-21 LAB — GLUCOSE, CAPILLARY
Glucose-Capillary: 108 mg/dL — ABNORMAL HIGH (ref 70–99)
Glucose-Capillary: 113 mg/dL — ABNORMAL HIGH (ref 70–99)
Glucose-Capillary: 116 mg/dL — ABNORMAL HIGH (ref 70–99)
Glucose-Capillary: 120 mg/dL — ABNORMAL HIGH (ref 70–99)
Glucose-Capillary: 123 mg/dL — ABNORMAL HIGH (ref 70–99)
Glucose-Capillary: 129 mg/dL — ABNORMAL HIGH (ref 70–99)
Glucose-Capillary: 131 mg/dL — ABNORMAL HIGH (ref 70–99)
Glucose-Capillary: 133 mg/dL — ABNORMAL HIGH (ref 70–99)
Glucose-Capillary: 136 mg/dL — ABNORMAL HIGH (ref 70–99)
Glucose-Capillary: 144 mg/dL — ABNORMAL HIGH (ref 70–99)
Glucose-Capillary: 150 mg/dL — ABNORMAL HIGH (ref 70–99)
Glucose-Capillary: 151 mg/dL — ABNORMAL HIGH (ref 70–99)
Glucose-Capillary: 165 mg/dL — ABNORMAL HIGH (ref 70–99)
Glucose-Capillary: 98 mg/dL (ref 70–99)

## 2020-01-21 LAB — LACTIC ACID, PLASMA: Lactic Acid, Venous: 1.3 mmol/L (ref 0.5–1.9)

## 2020-01-21 LAB — LACTATE DEHYDROGENASE: LDH: 430 U/L — ABNORMAL HIGH (ref 98–192)

## 2020-01-21 LAB — CBC
HCT: 28.4 % — ABNORMAL LOW (ref 39.0–52.0)
Hemoglobin: 8.9 g/dL — ABNORMAL LOW (ref 13.0–17.0)
MCH: 29.3 pg (ref 26.0–34.0)
MCHC: 31.3 g/dL (ref 30.0–36.0)
MCV: 93.4 fL (ref 80.0–100.0)
Platelets: 352 10*3/uL (ref 150–400)
RBC: 3.04 MIL/uL — ABNORMAL LOW (ref 4.22–5.81)
RDW: 22.5 % — ABNORMAL HIGH (ref 11.5–15.5)
WBC: 33.8 10*3/uL — ABNORMAL HIGH (ref 4.0–10.5)
nRBC: 1.5 % — ABNORMAL HIGH (ref 0.0–0.2)

## 2020-01-21 LAB — HEPARIN LEVEL (UNFRACTIONATED): Heparin Unfractionated: <0.1 IU/mL — ABNORMAL LOW (ref 0.30–0.70)

## 2020-01-21 LAB — MAGNESIUM: Magnesium: 2.8 mg/dL — ABNORMAL HIGH (ref 1.7–2.4)

## 2020-01-21 MED ORDER — DARBEPOETIN ALFA 60 MCG/0.3ML IJ SOSY
60.0000 ug | PREFILLED_SYRINGE | INTRAMUSCULAR | Status: DC
  Filled 2020-01-21: qty 0.3

## 2020-01-21 MED ORDER — AMIODARONE IV BOLUS ONLY 150 MG/100ML
150.0000 mg | Freq: Once | INTRAVENOUS | Status: AC
  Administered 2020-01-21: 09:00:00 150 mg via INTRAVENOUS

## 2020-01-21 MED ORDER — DOPAMINE-DEXTROSE 3.2-5 MG/ML-% IV SOLN
2.5000 ug/kg/min | INTRAVENOUS | Status: DC
  Administered 2020-01-21: 2.5 ug/kg/min via INTRAVENOUS
  Filled 2020-01-21: qty 250

## 2020-01-21 NOTE — Procedures (Signed)
Chest opened urgently at bedside No mediastinal hematoma Hemodynamics improved with chest opening. Spacer left between sternal halves Sterile dressing secured to the skin. Tolerated well. Corey Palmer Z. [name], [place]

## 2020-01-21 NOTE — Progress Notes (Signed)
NAME:  Corey Palmer, MRN:  [id number], DOB:  August 09, 1970, LOS: 12 ADMISSION DATE:  01/14/2020, CONSULTATION DATE: 12/29/2019 REFERRING MD: Aundra Dubin, CHIEF COMPLAINT: Respiratory failure post Impella  HPI/course in hospital  49 year old man admitted with A. fib/RVR, torsades, cardiogenic shock [EF 15] requiring Impella, CABG x5, AKI requiring initiation of CRRT   Past Medical History:    has a past medical history of Anginal pain ([place]), Anxiety, Arthritis, Automatic implantable cardioverter-defibrillator in situ (2007), Bipolar disorder ([place]), CAD (coronary artery disease), Cancer ([place]) (2013), CHF (congestive heart failure) ([place]), Chondrocalcinosis of right knee (9/93/5701), Chronic systolic heart failure ([place]), Depression, Diabetes uncomplicated adult-type II, Dilated cardiomyopathy ([place]), Dyslipidemia, Dysrhythmia, GERD (gastroesophageal reflux disease), Gout, unspecified, Hepatic steatosis (2011), History of kidney stones, echocardiogram, Hypertension, Obesity (BMI 30.0-34.9), Pacemaker, Paroxysmal atrial fibrillation ([place]), Peptic ulcer, Shortness of breath, and Sleep apnea.  Significant Hospital Events:  12/2 Afib with RVR. Cardioverted in ED. AICD did not fire. Milrinone for low co ox.  12/7 ICD fired, Torsades- likely from low K of 2.8.  12/8 Cath- multivessel disease w/ low CO. Volume overloaded.  12/11 Underwent Impella 5.5 insertion for persistent symptoms of forward failure with low cardiac indices. 12/12 Back to the OR for displacement of Impella. Extubated.  12/15 CABG x5.  12/16 Emergently opened chest at bedside for tamponade, clot compressing right atrium  12/16 To OR for Exploration of chest due to bleeding  12/19 Started CRRT 12/23 Sternal Closure in OR  Consults:  PCCM, nephrology, cardiology, cardiothoracic surgery  Procedures:    Significant Diagnostic Tests:    Micro Data:  Blood culture 12/14 > no growth Blood culture 12/16 > no growth Respiratory culture  12/20 > no growth Sternal wound culture 12/23 >  Antimicrobials:  Vancomycin 12/16 >> Meropenem 12/16 >>  Objective   Blood pressure (!) 89/66, pulse 73, temperature (!) 97.16 F (36.2 C), resp. rate (!) 28, height 6\' 3"  (1.905 m), weight 109.7 kg, SpO2 100 %. PAP: (29-343)/(23-333) 34/24 CVP:  [13 mmHg-29 mmHg] 17 mmHg CO:  [4.6 L/min-5 L/min] 4.8 L/min CI:  [2 L/min/m2-2.2 L/min/m2] 2.1 L/min/m2  Vent Mode: PRVC FiO2 (%):  [40 %] 40 % Set Rate:  [28 bmp] 28 bmp Vt Set:  [510 mL] 510 mL PEEP:  [5 cmH20] 5 cmH20 Plateau Pressure:  [23 cmH20-28 cmH20] 27 cmH20   Intake/Output Summary (Last 24 hours) at 01/21/2020 0919 Last data filed at 01/21/2020 0700 Gross per 24 hour  Intake 4706.08 ml  Output 5191 ml  Net -484.92 ml   Filed Weights   12/30/2019 0500 01/04/2020 0815 01/21/20 0500  Weight: 114 kg 112.9 kg 109.7 kg   PAP: (29-343)/(23-333) 34/24 CVP:  [13 mmHg-29 mmHg] 17 mmHg CO:  [4.6 L/min-5 L/min] 4.8 L/min CI:  [2 L/min/m2-2.2 L/min/m2] 2.1 L/min/m2  Examination: Gen:      No acute distress, chronically ill-appearing HEENT:  EOMI, sclera anicteric, ETT Neck:     No masses; no thyromegaly Lungs:    Clear to auscultation bilaterally; normal respiratory effort CV:         Regular rate and rhythm; no murmurs Abd:      + bowel sounds; soft, non-tender; no palpable masses, no distension Ext:    No edema; adequate peripheral perfusion Skin:      Warm and dry; no rash Neuro: Sedated  Labs/imaging reviewed Significant for BUN/creatinine 31/1.95 Hemoglobin 9.5  Chest x-ray today with support apparatus in stable position, unchanged bilateral hazy lung opacities.  Assessment & Plan:  Acute  hypoxic respiratory failure Continue vent support.  Unable to wean due to hemodynamic instability Follow intermittent chest x-ray  Cardiogenic shock Acute on chronic systolic heart failure requiring mechanical cardiac support with Impala and titration of inotropes Severe coronary  artery disease status post CABG, complicated by cardiac tamponade, post hematoma removal in OR x 2 -Continue pressors, inotropes as ordered, weaning as able -Continue nitric oxide 20 ppm for now, consider weaning per CTS. -on impella  Afib:  - Amio gtt   Bipolar affective Diabetes type 2 with hyperglycemia - Insulin infusion  Paroxysmal atrial fibrillation requiring amiodarone infusion.  Acute kidney injury-improving cardiorenal syndrome  Acute blood loss anemia -Volume removal still an issue.  Remains on crrt -On empiric antibiotics -Following BMP, electrolytes -Following CBC, goal hemoglobin 8.0 -on heparin gtt thru purge  Transaminitis:  -improving    Daily Goals Checklist  Pain/Anxiety/Delirium protocol (if indicated): versed and fentanyl infusions.  VAP protocol (if indicated): per protocol DVT prophylaxis: SCD GI prophylaxis: Protonix Glucose control: insulin infusion Code Status: Full code Family Communication: Per primary team but able to update wife at bedside re: vent Disposition: ICU  Critical care time:   The patient is critically ill with multiple organ system failure and requires high complexity decision making for assessment and support, frequent evaluation and titration of therapies, advanced monitoring, review of radiographic studies and interpretation of complex data.   Critical Care Time devoted to patient care services, exclusive of separately billable procedures, described in this note is 35 minutes.   Marshell Garfinkel MD [facility] Please see [email or web address] for pager details.  01/21/2020, 9:20 AM

## 2020-01-21 NOTE — Progress Notes (Signed)
[place] for heparin Indication: Impella 5.5  Allergies  Allergen Reactions  . Orange Fruit Anaphylaxis, Hives and Other (See Comments)    Per Pt- Blisters around lips and Hives all over, also  . Penicillins Anaphylaxis    Did it involve swelling of the face/tongue/throat, SOB, or low BP? Yes Did it involve sudden or severe rash/hives, skin peeling, or any reaction on the inside of your mouth or nose? Yes Did you need to seek medical attention at a hospital or doctor's office? No When did it last happen?childhood If all above answers are "NO", may proceed with cephalosporin use.  [name] [Empagliflozin] Itching  . [name] [Insulin Glargine] Nausea And Vomiting    Patient Measurements: Height: 6\' 3"  (190.5 cm) Weight: 109.7 kg (241 lb 13.5 oz) IBW/kg (Calculated) : 84.5 Heparin dosing wt: 101 kg  Vital Signs: Temp: 97 F (36.1 C) (12/24 1330) Temp Source: Core (12/24 0800) BP: 89/66 (12/24 0337) Pulse Rate: 107 (12/24 1330)  Labs: Recent Labs    01/19/20 0338 01/19/20 0444 01/28/2020 0308 12/30/2019 0310 01/06/2020 1652 01/25/2020 1725 01/02/2020 2008 01/21/20 0410 01/21/20 0412  HGB 9.0*   < > 8.8*   < > 9.2*   < > 9.9* 8.7* 9.5*  HCT 28.1*   < > 28.9*   < > 28.7*   < > 29.0* 29.0* 28.0*  PLT 279  --  280  --  346  --   --  315  --   LABPROT  --   --   --   --  15.5*  --   --   --   --   INR  --   --   --   --  1.3*  --   --   --   --   HEPARINUNFRC <0.10*  --  <0.10*  --   --   --   --  <0.10*  --   CREATININE 1.87*   < > 1.81*  --  2.20*  --   --  1.95*  --    < > = values in this interval not displayed.    Estimated Creatinine Clearance: 60 mL/min (A) (by C-G formula based on SCr of 1.95 mg/dL (H)).   Assessment: 49 yo male s/p impella 5.5 placement. Pt started on heparinized purge and systemic heparin. Pt now s/p CABG 12/15 with Impella remaining in place c/b significant bleeding and hematoma requiring opened  chest and reexploration in OR 12/16.  Impella on P7 with stable purge pressures. Heparin running via purge only at this time, purge flow rate 8.9 ml/hr (approx 445 units/hr heparin). Heparin level undetectable as expected, CBC stable this AM.LDH stable at 400s. No s/sx of bleeding per nursing.   Goal of Therapy:  Heparin level 0.2-0.5 units/ml Monitor platelets by anticoagulation protocol: Yes   Plan:  Continue heparin via the purge only  Will follow with CVTS regarding systemic heparin once appropriate to initiate Daily heparin level for now   [employer].D. CPP, BCPS Clinical Pharmacist [phone] 01/21/2020 3:06 PM    Please check AMION for all [facility] phone numbers After 10:00 PM, call [place] [phone]

## 2020-01-21 NOTE — Progress Notes (Addendum)
Patient ID: Corey Palmer, male   DOB: [birth year], 49 y.o.   MRN: [id number]     Advanced Heart Failure Rounding Note  PCP-Cardiologist: No primary care provider on file.   Subjective:    - 12/7 Torsades -ICD shock x1.  - 12/11 Impella 5.5 placed.  - 12/12 Impella repositioned in OR - 12/15 CABG x 4 with LIMA-LAD, SVG-PDA/PLV, SVG-OM - 12/16 DCCV afib.  Back to OR to re-open chest and again to evacuate hematoma.  - 12/19 CVVH initiated  - 12/23 Chest closed  Chest closed yesterday. Tolerated well overnight but throughout the day has become much more tenuous. Now on NE 52 Epi 9. Milrinone 0.25, DA 2.5 and VP 0.04   MAP 60-70s. Keeping even on CVVHD.   Remains anuric, 22 cc in UOP yesterday. Continues on CVVH for volume removal. Pulling 200/hr. Additional 7.8 L removed yesterday. Wt continues to trend down. CVP 12-13.  WBC up 26-> 28K   Going back to OR for chest closure today. Impella P-7 flowing at 3.9  Echo shows severe biventricular dysfunction. Impella catheter in good location. Marked septal bounce suggestive of constriction/tamponade.   Swan numbers: CVP 15 PA 31/24 CO/CI 4.9/2.1 Co-ox 36% PAPi 0.5  Impella 5.5 P-7 Flow 3.9 Heparin only in purge   Echo (12/10): EF <20%, moderate LV dilation, normal RV size with mildly decreased systolic function.    Objective:   Weight Range: 109.7 kg Body mass index is 30.23 kg/m.   Vital Signs:                 Temp:  [96.4 F (35.8 C)-99.68 F (37.6 C)] 98.24 F (36.8 C) (12/24 1503) Pulse Rate:  [29-138] 119 (12/24 1503) Resp:  [22-28] 28 (12/24 1503) BP: (89-102)/(66-75) 89/66 (12/24 0337) SpO2:  [70 %-100 %] 93 % (12/24 1503) Arterial Line BP: (67-104)/(46-73) 83/57 (12/24 1503) FiO2 (%):  [40 %] 40 % (12/24 0807) Weight:  [109.7 kg] 109.7 kg (12/24 0500) Last BM Date: 01/21/20  Weight change:      Filed Weights   01/07/2020 0500 01/11/2020 0815 01/21/20 0500  Weight: 114 kg 112.9 kg 109.7 kg     Intake/Output:             Intake/Output Summary (Last 24 hours) at 01/21/2020 1557 Last data filed at 01/21/2020 1500    Gross per 24 hour  Intake 5639.05 ml  Output 5663 ml  Net -23.95 ml                 Physical Exam   CVP 15 General:  Ill appearing. Sedated on vent HEENT: +ETT Neck: supple. RIJ swan.  Cor: Chest closed. Praveena in Place. + CTs  Lungs: coarse Abdomen: obese soft, nontender, nondistended. Hypoactive bowel sounds. Extremities: no cyanosis, clubbing, rash, 2+ edema  RFV vascath Neuro: sedated on vent   Telemetry    Afib 110-120s  Personally reviewed   Labs    CBC Recent Labs (last 2 labs)           Recent Labs    01/07/2020 0308 12/31/2019 0310 01/12/2020 1652 01/07/2020 1725 01/21/20 0410 01/21/20 0412  WBC 21.2*  --  26.3*  --  28.4*  --   NEUTROABS 16.2*  --   --   --  24.0*  --   HGB 8.8*   < > 9.2*   < > 8.7* 9.5*  HCT 28.9*   < > 28.7*   < > 29.0* 28.0*  MCV 94.1  --  93.8  --  95.4  --   PLT 280  --  346  --  315  --    < > = values in this interval not displayed.     Basic Metabolic Panel Recent Labs (last 2 labs)           Recent Labs    01/06/2020 0308 01/23/2020 0310 01/14/2020 1652 01/07/2020 1725 01/21/20 0410 01/21/20 0412  NA 135   < > 134*   < > 135 134*  K 4.2   < > 4.3   < > 4.5 4.4  CL 102  --  101  --  103  --   CO2 22  --  21*  --  21*  --   GLUCOSE 139*  --  125*  --  143*  --   BUN 30*  --  33*  --  31*  --   CREATININE 1.81*  --  2.20*  --  1.95*  --   CALCIUM 8.1*  --  8.5*  --  8.5*  --   MG 2.4  --  2.6*  --  2.8*  --   PHOS 2.6  --  3.9  --   --   --    < > = values in this interval not displayed.     Liver Function Tests Recent Labs (last 2 labs)       Recent Labs    01/28/2020 1652 01/21/20 0410  AST 42* 39  ALT 76* 62*  ALKPHOS 162* 134*  BILITOT 1.2 1.3*  PROT 6.0* 6.2*  ALBUMIN 2.1* 2.3*     Recent Labs (last 2 labs)   No results for input(s): LIPASE, AMYLASE in the last 72  hours.   Cardiac Enzymes Recent Labs (last 2 labs)   No results for input(s): CKTOTAL, CKMB, CKMBINDEX, TROPONINI in the last 72 hours.    BNP: BNP (last 3 results) Recent Labs (within last 365 days)     Recent Labs    01/22/2020 1619  BNP 577.5*      ProBNP (last 3 results) Recent Labs (within last 365 days)  No results for input(s): PROBNP in the last 8760 hours.     D-Dimer Recent Labs (last 2 labs)   No results for input(s): DDIMER in the last 72 hours.   Hemoglobin A1C Recent Labs (last 2 labs)   No results for input(s): HGBA1C in the last 72 hours.   Fasting Lipid Panel Recent Labs (last 2 labs)   No results for input(s): CHOL, HDL, LDLCALC, TRIG, CHOLHDL, LDLDIRECT in the last 72 hours.   Thyroid Function Tests  Recent Labs (last 2 labs)   No results for input(s): TSH, T4TOTAL, T3FREE, THYROIDAB in the last 72 hours.  Invalid input(s): FREET3    Other results:   Imaging     DG Chest 1 View    Result Date: 01/21/2020  CLINICAL DATA:  Persistent emesis.  History of open heart surgery EXAM: CHEST  1 VIEW COMPARISON:  Yesterday FINDINGS: Endotracheal tube with tip just below the clavicular heads. The enteric tube reaches the stomach. Chest tubes in place. Swan-Ganz catheter with tip at the right main pulmonary artery. Defibrillator lead into the right ventricle. Ventricular assist device in stable position. Unchanged hazy appearance of the bilateral chest, likely a combination of edema and atelectasis. No visible pneumothorax. Stable cardiomegaly. IMPRESSION: 1. Stable hardware positioning. 2. Stable edema and/or atelectasis symmetrically in the low volume lungs. Electronically Signed  By: Monte Fantasia M.D.   On: 01/21/2020 08:33     DG Abd 1 View    Result Date: 01/21/2020  CLINICAL DATA:  Ileus. EXAM: ABDOMEN - 1 VIEW COMPARISON:  01/14/2020 FINDINGS: 0804 hours. Gaseous distention of stomach and colon evident. No substantial  small bowel dilatation. NG tube tip is just above the esophagogastric junction. IMPRESSION: 1. NG tube tip is in the distal esophagus just above the gastroesophageal junction. 2. Gaseous distention of stomach and colon. These results will be called to the ordering clinician or representative by the Radiologist Assistant, and communication documented in the PACS or [employer]. Electronically Signed   By: Misty Stanley M.D.   On: 01/21/2020 08:34      Medications:     Scheduled Medications:  .  sodium chloride     Intravenous  Once   .  aspirin   324 mg  Per Tube  Daily   .  atorvastatin   80 mg  Per Tube  Daily   .  B-complex with vitamin C   1 tablet  Per Tube  Daily   .  busPIRone   10 mg  Per Tube  TID   .  chlorhexidine gluconate (MEDLINE KIT)   15 mL  Mouth Rinse  BID   .  Chlorhexidine Gluconate Cloth   6 each  Topical  Daily   .  darbepoetin (ARANESP) injection - NON-DIALYSIS   60 mcg  Subcutaneous  Q Fri-1800   .  feeding supplement (PIVOT 1.5 CAL)   1,000 mL  Per Tube  Q24H   .  FLUoxetine   40 mg  Per Tube  BID   .  mouth rinse   15 mL  Mouth Rinse  10 times per day   .  nystatin   5 mL  Oral  QID   .  pantoprazole sodium   40 mg  Per Tube  Daily   .  sodium chloride flush   10-40 mL  Intracatheter  Q12H   .  sodium chloride flush   3 mL  Intravenous  Q12H   .  valproic acid   250 mg  Per Tube  BID     Infusions:  .   prismasol BGK 4/2.5  500 mL/hr at 01/21/20 1308   .   prismasol BGK 4/2.5  200 mL/hr at 01/16/2020 2255   .  sodium chloride  Stopped (12/30/2019 1646)   .  sodium chloride  10 mL/hr at 01/21/20 1500   .  sodium chloride  Stopped (01/16/20 1032)   .  amiodarone  60 mg/hr (01/21/20 1500)   .  DOPamine  2.5 mcg/kg/min (01/21/20 1500)   .  epinephrine  10 mcg/min (01/21/20 0936)   .  fentaNYL infusion INTRAVENOUS  100 mcg/hr (01/21/20 1500)   .   impella catheter heparin 50 unit/mL in dextrose 5%  9 mL/hr at 01/21/20 0015   .  insulin  1.6 mL/hr at 01/21/20 0800   .  lactated ringers  20 mL/hr at 01/21/20 1500   .  meropenem (MERREM) IV  Stopped (01/21/20 1135)   .  midazolam  1 mg/hr (01/21/20 1500)   .  milrinone  0.25 mcg/kg/min (01/21/20 1507)   .  norepinephrine (LEVOPHED) Adult infusion  42 mcg/min (01/21/20 0938)   .  prismasol BGK 4/2.5  1,500 mL/hr at 01/21/20 1106   .  vancomycin  Stopped (01/21/20 1317)   .  vasopressin  0.04 Units/min (  01/21/20 1500)     PRN Medications:  sodium chloride, sodium chloride, bisacodyl **OR** bisacodyl, dextrose, docusate, fentaNYL, fentaNYL (SUBLIMAZE) injection, fentaNYL (SUBLIMAZE) injection, heparin, heparin, metoprolol tartrate, midazolam, midazolam, midazolam, ondansetron (ZOFRAN) IV, oxyCODONE, polyethylene glycol, sodium chloride flush, sodium chloride flush     Assessment/Plan   1. Cardiogenic shock/acute on chronic systolic CHF: Initially nonischemic cardiomyopathy. Possible familial cardiomyopathy as both parents had cardiomyopathy and died at around 33.However, [employer] gene testing did not show any common mutation for cardiomyopathy.  However, this admission noted to have severe 3 vessel disease so suspect component of ischemic cardiomyopathy. [employer] Jude ICD.Echo in 8/20 with EF 15% and mildly decreased RV function. Echo this admission with EF < 20%, moderate LV dilation, RV mildly reduced, severe LAE, no significant MR.Low output HF with markedly low EF. He has a long history of cardiomyopathy (20 yrs), tends to minimize symptoms. Cardiorenal syndrome with creatinine up to 2.2,  stabilized on milrinone and Impella 5.5. SCr 1.44 day of CABG. CABG 12/15.  Post-op shock, back to OR 12/16 to open chest (chest wall compartment syndrome) and again to evacuate hematoma.  TEE post-op with severe RV dysfunction.  CVVHD initiated 12/19 for volume  removal in setting of rising Scr/decreased UOP. Suspect ATN. S/p chest closure 12/23. Hemodynamics much worse today with rising pressor demands. Bedside echo severe biventricular dysfunction with marked septal bounce.Co-ox 36% - I d/w Dr. [name]. He is planning to re-open chest - Keep CVVHD even - Continue Impella at current level - Continue EPI, NE, VP and Milrinone. Titrate as needed 2. Atrial fibrillation:H/o PAF.He was on dofetilide in the remote past but this was stopped due to noncompliance. He had an upper GI bleed from antral ulcers in 3/12. He was seen by GI and was deemed safe to restart anticoagulation as long as he remains on a PPI.No apparent recurrence of AF until just prior to this admission, was cardioverted back to NSR in ER but went back into atrial fibrillation.  Post-op afib, DCCV to NSR/BiV pacing am 12/16, now back in atrial fibrillation Remains in AF with RVR - Continue IV amio - Would not try to cardiovert again until back on heparin gtt and vasoactive meds weaned.  - Off heparin post-op with bleeding, only getting heparin in Impella purge currently.  Restart systemic heparin gtt when surgery thinks safe to do so. No change - Unable to do Maze with CABG.    3. CAD:  Cath this admission with severe 3 vessel disease. CABG x 4 on 12/15.  - Continue atorvastatin, ASA. - continue current management 4. Acute on chronic hypoxemic respiratory failure: Pulmonary edema, vent per CCM.  - Remains on vent - CCM following 5. AKI on CKD stage ?3: Likely ATN/cardiorenal - remains on CVVHD. Keep even for now - Continue CVVHD per [facility]. Appreciate their help.  6. Diabetes: Insulin.  7. Hyponatremia: Resolved.  8. Torsades/ICD shock: 12/7/212 in hospital event, in setting of severe hypokalemia and hypomagnesemia.   9. Gout: h/o gout. Complained of rt knee pain c/w previous flares - treated w/ prednisone burst (completed).  10: ID: Tm 99.3 PCT 13.46 -> 8.65  Cultures NGTD.  - WBC  up 26-> 28K  - On vancomycin/meropenem (PCN allergy). Tmax 99.6. Co-ox more suggestive of cardiogenic shock and not sepsis - can add fungal coverage as needed   Total time spent 60 minutes. Over half that time spent discussing above.   Glori Bickers, MD  2:59 PM

## 2020-01-21 NOTE — Progress Notes (Signed)
Patient ID: Corey Palmer, male   DOB: 1970-03-04, 49 y.o.   MRN: [id number]  S:  Went to OR for sternal closure-  CRRT held but resumed and running well.   UF 894 overnight- kept even after chest closure - minimal UOP  O:BP (!) 89/66 Comment: aline  Pulse 73   Temp (!) 97.16 F (36.2 C)   Resp (!) 28   Ht _0  (1.905 m)   Wt 109.7 kg   SpO2 100%   BMI 30.23 kg/m   Intake/Output Summary (Last 24 hours) at 01/21/2020 0729 Last data filed at 01/21/2020 0700 Gross per 24 hour  Intake 4996.23 ml  Output 5891 ml  Net -894.77 ml   Intake/Output: I/O last 3 completed shifts: In: 7388.7 [I.V.:5761; Other:277.6; VP/XT:062.6; IV Piggyback:701.9] Out: 94854 [Urine:35; Emesis/NG output:250; OEVOJ:5009; Stool:650; Blood:350; Chest Tube:272]  Intake/Output this shift:  No intake/output data recorded. Weight change: -2.3 kg Gen: intubated and sedated CVS: RRR, open sternal wound Resp: cta Abd: +BS, soft, NT/ND Ext: 2+ edema to gravity dependent areas  Recent Labs  Lab 01/16/20 0349 01/16/20 0427 01/17/20 0324 01/17/20 0326 01/17/20 1525 01/17/20 1532 01/18/20 0430 01/18/20 1512 01/18/20 1525 01/19/20 0338 01/19/20 0444 01/19/20 1641 01/19/20 1652 01/17/2020 0308 01/22/2020 0310 01/10/2020 1652 12/30/2019 1725 01/18/2020 2008 01/21/20 0410 01/21/20 0412  NA 138   < >  --    < > 136   < > 136 136   < > 135   < > 135   < > 135 136 134* 135 137 135 134*  K 3.6   < >  --    < > 3.8   < > 3.9 4.5   < > 4.0   < > 4.1   < > 4.2 4.1 4.3 4.7 4.4 4.5 4.4  CL 104   < >  --    < > 102  --  104 102  --  103  --  103  --  102  --  101  --   --  103  --   CO2 24   < >  --    < > 25  --  24 24  --  23  --  22  --  22  --  21*  --   --  21*  --   GLUCOSE 161*   < >  --    < > 149*  --  158* 123*  --  127*  --  156*  --  139*  --  125*  --   --  143*  --   BUN 36*   < >  --    < > 30*  --  28* 27*  --  29*  --  32*  --  30*  --  33*  --   --  31*  --   CREATININE 2.02*   < >  --    < > 1.66*  --   1.75* 1.73*  --  1.87*  --  1.91*  --  1.81*  --  2.20*  --   --  1.95*  --   ALBUMIN 2.0*   < > 2.1*   < > 2.1*  --  2.2* 2.3*  --  2.2*  --  2.1*  --  2.1*  --  2.1*  --   --  2.3*  --   CALCIUM 8.0*   < >  --    < > 7.8*  --  8.2* 8.2*  --  8.1*  --  7.9*  --  8.1*  --  8.5*  --   --  8.5*  --   PHOS  --    < >  --    < > 2.7  --  2.8 2.9  --  2.8  --  2.4*  --  2.6  --  3.9  --   --   --   --   AST 350*  --  172*  --   --   --  86*  --   --  59*  --   --   --  46*  --  42*  --   --  39  --   ALT 335*  --  262*  --   --   --  183*  --   --  131*  --   --   --  89*  --  76*  --   --  62*  --    < > = values in this interval not displayed.   Liver Function Tests: Recent Labs  Lab 01/04/2020 0308 01/22/2020 1652 01/21/20 0410  AST 46* 42* 39  ALT 89* 76* 62*  ALKPHOS 167* 162* 134*  BILITOT 0.9 1.2 1.3*  PROT 6.1* 6.0* 6.2*  ALBUMIN 2.1* 2.1* 2.3*   No results for input(s): LIPASE, AMYLASE in the last 168 hours. No results for input(s): AMMONIA in the last 168 hours. CBC: Recent Labs  Lab 01/18/20 0430 01/18/20 1525 01/19/20 0338 01/19/20 0444 01/09/2020 0308 01/14/2020 0310 01/11/2020 1652 01/25/2020 1725 01/17/2020 2008 01/21/20 0410 01/21/20 0412  WBC 19.8*  --  18.9*  --  21.2*  --  26.3*  --   --  28.4*  --   NEUTROABS 15.3*  --  14.5*  --  16.2*  --   --   --   --  24.0*  --   HGB 9.0*   < > 9.0*   < > 8.8*   < > 9.2*   < > 9.9* 8.7* 9.5*  HCT 28.8*   < > 28.1*   < > 28.9*   < > 28.7*   < > 29.0* 29.0* 28.0*  MCV 92.0  --  92.4  --  94.1  --  93.8  --   --  95.4  --   PLT 231  --  279  --  280  --  346  --   --  315  --    < > = values in this interval not displayed.   Cardiac Enzymes: No results for input(s): CKTOTAL, CKMB, CKMBINDEX, TROPONINI in the last 168 hours. CBG: Recent Labs  Lab 01/21/20 0001 01/21/20 0203 01/21/20 0407 01/21/20 0542 01/21/20 0643  GLUCAP 133* 113* 136* 165* 144*    Iron Studies: No results for input(s): IRON, TIBC, TRANSFERRIN, FERRITIN  in the last 72 hours. Studies/Results: DG Chest Portable 1 View  Result Date: 01/13/2020 CLINICAL DATA:  CABG, intubated EXAM: PORTABLE CHEST 1 VIEW COMPARISON:  01/18/2020 FINDINGS: Single frontal view of the chest demonstrates stable endotracheal tube tip at level of thoracic inlet. Enteric catheter passes below diaphragm tip excluded by collimation. Right-sided arterial line with Impella device unchanged. Right internal jugular flow directed central venous catheter tip overlies the main pulmonary outflow tract. Left subclavian catheter tip overlies the superior vena cava. Stable single lead pacer/AICD. Interval placement of median sternotomy wires. There are bilateral chest tubes overlying the lung bases.  Cardiac silhouette is enlarged. Consolidation at the lung bases likely reflects atelectasis. No effusion or pneumothorax. IMPRESSION: 1. Support devices as above. 2. Bibasilar hypoventilatory changes.  No effusion or pneumothorax. Electronically Signed   By: Randa Ngo M.D.   On: 01/24/2020 16:02   DG CHEST PORT 1 VIEW  Result Date: 01/21/2020 CLINICAL DATA:  Open-heart surgery, open sternum EXAM: PORTABLE CHEST 1 VIEW COMPARISON:  Radiograph 01/18/2020, CT 10/14/2011 FINDINGS: *Endotracheal tube in stable position within the mid trachea approximately 3 cm from the carina. *Transesophageal tube tip terminates below the GE junction, beyond the margins of imaging. *Right IJ approach Swan-Ganz catheter. Tip approximating the right pulmonary artery. *Right upper extremity arterial line with Impella device overlying the cardiac silhouette. Skin staples in over the right chest wall may be related to cut down. *Left chest wall battery pack with stable defibrillator lead towards the cardiac apex. *Mediastinal and left pleural drain in place. *Additional external support devices overlie the patient. Persistently low lung volumes with atelectatic changes and more hazy interstitial opacities in the mid to  lower lungs likely reflecting edema. Some persistent coalescent retrocardiac opacity is also unchanged from prior. No pneumothorax. Suspect bilateral effusions, left greater than right. Sternal diastasis. IMPRESSION: 1. Lines and tubes as above. 2. Persistently low lung volumes with atelectatic changes, edema and more coalescent airspace opacity in the retrocardiac space. Electronically Signed   By: Lovena Le M.D.   On: 01/01/2020 06:40   ECHOCARDIOGRAM LIMITED  Result Date: 01/19/2020    ECHOCARDIOGRAM LIMITED REPORT   Patient Name:   MELINDA POTTINGER Date of Exam: 01/19/2020 Medical Rec #:  [id number]     Height:       75.0 in Accession #:    [id number]    Weight:       257.5 lb Date of Birth:  Oct 15, 1970     BSA:          2.443 m Patient Age:    12 years      BP:           64/54 mmHg Patient Gender: M             HR:           114 bpm. Exam Location:  Inpatient Procedure: Limited Echo, Color Doppler and Cardiac Doppler Indications:    I50.9* Heart failure (unspecified)  History:        Patient has prior history of Echocardiogram examinations, most                 recent 01/18/2020. CHF, Prior CABG and Pacemaker,                 Arrythmias:Atrial Fibrillation; Risk Factors:Hypertension,                 Diabetes, Dyslipidemia and Sleep Apnea. Impella 5.5 placed                 01/21/2020.  Sonographer:    Raquel Sarna Senior RDCS Referring Phys: [id number] Carlene Coria  Sonographer Comments: Limited to evaluate RV function, Adam (Impella rep) present for exam. IMPRESSIONS  1. RV size appears normal but there is severely reduced RV function. Severe RV dysfunction was present on the prior study. Right ventricular systolic function is severely reduced. The right ventricular size is normal.  2. Impella present in the LV, 5.6 cm from the AoV. The LV is dilated with septal bowing into the RV. Trace MR. Left ventricular ejection fraction, by  estimation, is 10-15%. The left ventricle demonstrates global hypokinesis. The left  ventricular internal cavity size was moderately dilated.  3. Left atrial size was mildly dilated.  4. The mitral valve is grossly normal. Trivial mitral valve regurgitation. No evidence of mitral stenosis. Comparison(s): No significant change from prior study. FINDINGS  Left Ventricle: Impella present in the LV, 5.6 cm from the AoV. The LV is dilated with septal bowing into the RV. Trace MR. Left ventricular ejection fraction, by estimation, is 10-15%. The left ventricle demonstrates global hypokinesis. The left ventricular internal cavity size was moderately dilated. Right Ventricle: RV size appears normal but there is severely reduced RV function. Severe RV dysfunction was present on the prior study. The right ventricular size is normal. Right ventricular systolic function is severely reduced. Left Atrium: Left atrial size was mildly dilated. Right Atrium: Right atrial size was normal in size. Mitral Valve: The mitral valve is grossly normal. Trivial mitral valve regurgitation. No evidence of mitral valve stenosis. Tricuspid Valve: The tricuspid valve is grossly normal. Tricuspid valve regurgitation is trivial. No evidence of tricuspid stenosis. Venous: IVC assessment for right atrial pressure unable to be performed due to mechanical ventilation. Additional Comments: A pacer wire is visualized in the right atrium and right ventricle. RIGHT VENTRICLE RV S prime:     4.58 cm/s TAPSE (M-mode): 0.6 cm TRICUSPID VALVE TR Peak grad:   13.4 mmHg TR Vmax:        183.00 cm/s Eleonore Chiquito MD Electronically signed by Eleonore Chiquito MD Signature Date/Time: 01/19/2020/11:53:14 AM    Final    . sodium chloride   Intravenous Once  . aspirin  324 mg Per Tube Daily  . atorvastatin  80 mg Per Tube Daily  . B-complex with vitamin C  1 tablet Per Tube Daily  . busPIRone  10 mg Per Tube TID  . chlorhexidine gluconate (MEDLINE KIT)  15 mL Mouth Rinse BID  . Chlorhexidine Gluconate Cloth  6 each Topical Daily  . feeding  supplement (PIVOT 1.5 CAL)  1,000 mL Per Tube Q24H  . FLUoxetine  40 mg Per Tube BID  . lipase/protease/amylase)  20,880 Units Per Tube Once   And  . sodium bicarbonate  650 mg Per Tube Once  . mouth rinse  15 mL Mouth Rinse 10 times per day  . nystatin  5 mL Oral QID  . pantoprazole sodium  40 mg Per Tube Daily  . sodium chloride flush  10-40 mL Intracatheter Q12H  . sodium chloride flush  3 mL Intravenous Q12H  . valproic acid  250 mg Per Tube BID    BMET    Component Value Date/Time   NA 134 (L) 01/21/2020 0412   NA 138 09/08/2018 0924   K 4.4 01/21/2020 0412   CL 103 01/21/2020 0410   CO2 21 (L) 01/21/2020 0410   GLUCOSE 143 (H) 01/21/2020 0410   BUN 31 (H) 01/21/2020 0410   BUN 23 09/08/2018 0924   CREATININE 1.95 (H) 01/21/2020 0410   CREATININE 1.13 05/13/2013 1533   CALCIUM 8.5 (L) 01/21/2020 0410   GFRNONAA 41 (L) 01/21/2020 0410   GFRNONAA 79 05/13/2013 1533   GFRAA >60 03/08/2019 1515   GFRAA >89 05/13/2013 1533   CBC    Component Value Date/Time   WBC 28.4 (H) 01/21/2020 0410   RBC 3.04 (L) 01/21/2020 0410   HGB 9.5 (L) 01/21/2020 0412   HGB 16.2 03/23/2018 1535   HCT 28.0 (L) 01/21/2020 0412   HCT 47.0  03/23/2018 1535   PLT 315 01/21/2020 0410   PLT 251 03/23/2018 1535   MCV 95.4 01/21/2020 0410   MCV 82 03/23/2018 1535   MCH 28.6 01/21/2020 0410   MCHC 30.0 01/21/2020 0410   RDW 21.9 (H) 01/21/2020 0410   RDW 15.0 03/23/2018 1535   LYMPHSABS 1.0 01/21/2020 0410   LYMPHSABS 2.5 03/23/2018 1535   MONOABS 2.0 (H) 01/21/2020 0410   EOSABS 0.3 01/21/2020 0410   EOSABS 0.2 03/23/2018 1535   BASOSABS 0.1 01/21/2020 0410   BASOSABS 0.0 03/23/2018 1535     Assessment/Plan:  1. AKI/CKD stage III- presumably due to ischemic ATN in setting of cardiogenic shock s/p CABG. Initially with good response with lasix drip, however starting to have decrease in UOP so CRRT started 01/16/20. All fluids 4K/2.5Ca: Pre-filter 500 ml/hr, post filter 200 ml/hr,  dialysate 1500 ml/hr. UF goal was high to achieve chest closure-  Now to back off 2. No heparin due to hematoma  3. Right femoral HD catheter placed 01/16/20  1. CAD s/p CABG complicated by cardiogenic shock and post-op hematoma. secondary closure on 12/23 2. Cardiogenic shock- underlying familial cardiomyopathy s/p redo CABG. Remains on pressors: Levophed, epinephrine, and vasopressin. Impella and milrinone.  1. Tolerated UF with CRRT to achieve chest closure- now will back off to keeping even so pressors can be weaned - d/w CTS  3. ABLA- s/p postoperative bleeding into chest cavity s/p re-exploration for tamponade/hematoma. CT surgery following. stable- will add some darbe 4. Hypophosphatemia- due to CRRT.  Repleted with K phos at some point-  Not critical- watch for now 5. Atrial fibrillation - s/p DCCV on 12/16 6. VDRF- per primary svc 7. ID - rising WBC. On vancomycin and meropenem.   Louis Meckel  [employer] [phone]

## 2020-01-21 NOTE — Progress Notes (Signed)
TCTS Evening Rounds  The patient has not tolerated chest closure. He needs the chest re-opened urgently, which is the physician consensus. This can be done safely at the bedside as no OR is available and there is no time to wait. Meline Russaw Z. [name], [place]

## 2020-01-21 NOTE — Progress Notes (Signed)
1 Day Post-Op Procedure(s) (LRB): STERNAL CLOSURE (N/A) TRANSESOPHAGEAL ECHOCARDIOGRAM (TEE) (N/A) APPLICATION OF WOUND VAC (N/A) Subjective: intubated  Objective: Vital signs in last 24 hours: Temp:  [96.62 F (35.9 C)-99.68 F (37.6 C)] 97.16 F (36.2 C) (12/24 0715) Pulse Rate:  [29-138] 73 (12/24 0600) Cardiac Rhythm: Atrial fibrillation (12/24 0400) Resp:  [22-33] 28 (12/24 0715) BP: (89-102)/(66-75) 89/66 (12/24 0337) SpO2:  [71 %-100 %] 100 % (12/24 0700) Arterial Line BP: (67-96)/(46-73) 70/65 (12/24 0715) FiO2 (%):  [40 %] 40 % (12/24 0400) Weight:  [109.7 kg-112.9 kg] 109.7 kg (12/24 0500)  Hemodynamic parameters for last 24 hours: PAP: (29-343)/(20-333) 34/24 CVP:  [12 mmHg-29 mmHg] 17 mmHg CO:  [4.6 L/min-5.5 L/min] 4.8 L/min CI:  [2 L/min/m2-2.4 L/min/m2] 2.1 L/min/m2  Intake/Output from previous day: 12/23 0701 - 12/24 0700 In: 4996.2 [I.V.:4065; NG/GT:258.3; IV Piggyback:500] Out: 2841 [Urine:25; Emesis/NG output:250; Stool:450; Blood:350; Chest Tube:180] Intake/Output this shift: No intake/output data recorded.  General appearance: cooperative Neurologic: intact Heart: irregularly irregular rhythm Lungs: diminished breath sounds bibasilar Abdomen: soft, non-tender; bowel sounds normal; no masses,  no organomegaly Extremities: edema 2+ Wound: dressed, dry  Lab Results: Recent Labs    01/01/2020 1652 01/05/2020 1725 01/21/20 0410 01/21/20 0412  WBC 26.3*  --  28.4*  --   HGB 9.2*   < > 8.7* 9.5*  HCT 28.7*   < > 29.0* 28.0*  PLT 346  --  315  --    < > = values in this interval not displayed.   BMET:  Recent Labs    01/04/2020 1652 01/26/2020 1725 01/21/20 0410 01/21/20 0412  NA 134*   < > 135 134*  K 4.3   < > 4.5 4.4  CL 101  --  103  --   CO2 21*  --  21*  --   GLUCOSE 125*  --  143*  --   BUN 33*  --  31*  --   CREATININE 2.20*  --  1.95*  --   CALCIUM 8.5*  --  8.5*  --    < > = values in this interval not displayed.    PT/INR:   Recent Labs    01/28/2020 1652  LABPROT 15.5*  INR 1.3*   ABG    Component Value Date/Time   PHART 7.354 01/21/2020 0412   HCO3 22.8 01/21/2020 0412   TCO2 24 01/21/2020 0412   ACIDBASEDEF 3.0 (H) 01/21/2020 0412   O2SAT 41.9 01/21/2020 0534   CBG (last 3)  Recent Labs    01/21/20 0407 01/21/20 0542 01/21/20 0643  GLUCAP 136* 165* 144*    Assessment/Plan: S/P Procedure(s) (LRB): STERNAL CLOSURE (N/A) TRANSESOPHAGEAL ECHOCARDIOGRAM (TEE) (N/A) APPLICATION OF WOUND VAC (N/A) keep even at least this am  Titrate drips; trial of DA Increase milrinone KUB for emesis   LOS: 22 days    Corey Palmer 01/21/2020

## 2020-01-21 NOTE — Anesthesia Postprocedure Evaluation (Signed)
Anesthesia Post Note  Patient: Corey Palmer  Procedure(s) Performed: STERNAL CLOSURE (N/A Chest) TRANSESOPHAGEAL ECHOCARDIOGRAM (TEE) (N/A ) APPLICATION OF WOUND VAC (N/A )     Patient location during evaluation: SICU Anesthesia Type: General Level of consciousness: sedated Pain management: pain level controlled Vital Signs Assessment: post-procedure vital signs reviewed and stable Respiratory status: patient remains intubated per anesthesia plan Cardiovascular status: stable Postop Assessment: no apparent nausea or vomiting Anesthetic complications: no   No complications documented.  Last Vitals:  Vitals:   01/21/20 1615 01/21/20 1648  BP:    Pulse: (!) 118   Resp: (!) 25   Temp: 36.8 C   SpO2: 97% 96%    Last Pain:  Vitals:   01/21/20 0800  TempSrc: Core  PainSc:                  Corey Palmer Corey Palmer

## 2020-01-22 ENCOUNTER — Inpatient Hospital Stay (HOSPITAL_COMMUNITY): Payer: BC Managed Care – PPO

## 2020-01-22 LAB — POCT I-STAT 7, (LYTES, BLD GAS, ICA,H+H)
Acid-base deficit: 1 mmol/L (ref 0.0–2.0)
Acid-base deficit: 1 mmol/L (ref 0.0–2.0)
Acid-base deficit: 2 mmol/L (ref 0.0–2.0)
Bicarbonate: 23.7 mmol/L (ref 20.0–28.0)
Bicarbonate: 24.2 mmol/L (ref 20.0–28.0)
Bicarbonate: 24.4 mmol/L (ref 20.0–28.0)
Calcium, Ion: 1.19 mmol/L (ref 1.15–1.40)
Calcium, Ion: 1.19 mmol/L (ref 1.15–1.40)
Calcium, Ion: 1.21 mmol/L (ref 1.15–1.40)
HCT: 23 % — ABNORMAL LOW (ref 39.0–52.0)
HCT: 23 % — ABNORMAL LOW (ref 39.0–52.0)
HCT: 29 % — ABNORMAL LOW (ref 39.0–52.0)
Hemoglobin: 7.8 g/dL — ABNORMAL LOW (ref 13.0–17.0)
Hemoglobin: 7.8 g/dL — ABNORMAL LOW (ref 13.0–17.0)
Hemoglobin: 9.9 g/dL — ABNORMAL LOW (ref 13.0–17.0)
O2 Saturation: 97 %
O2 Saturation: 98 %
O2 Saturation: 98 %
Patient temperature: 36.3
Patient temperature: 36.4
Patient temperature: 37.1
Potassium: 3.9 mmol/L (ref 3.5–5.1)
Potassium: 3.9 mmol/L (ref 3.5–5.1)
Potassium: 3.9 mmol/L (ref 3.5–5.1)
Sodium: 135 mmol/L (ref 135–145)
Sodium: 137 mmol/L (ref 135–145)
Sodium: 137 mmol/L (ref 135–145)
TCO2: 25 mmol/L (ref 22–32)
TCO2: 25 mmol/L (ref 22–32)
TCO2: 26 mmol/L (ref 22–32)
pCO2 arterial: 39.5 mmHg (ref 32.0–48.0)
pCO2 arterial: 42.7 mmHg (ref 32.0–48.0)
pCO2 arterial: 43.8 mmHg (ref 32.0–48.0)
pH, Arterial: 7.338 — ABNORMAL LOW (ref 7.350–7.450)
pH, Arterial: 7.362 (ref 7.350–7.450)
pH, Arterial: 7.395 (ref 7.350–7.450)
pO2, Arterial: 106 mmHg (ref 83.0–108.0)
pO2, Arterial: 112 mmHg — ABNORMAL HIGH (ref 83.0–108.0)
pO2, Arterial: 91 mmHg (ref 83.0–108.0)

## 2020-01-22 LAB — GLUCOSE, CAPILLARY
Glucose-Capillary: 105 mg/dL — ABNORMAL HIGH (ref 70–99)
Glucose-Capillary: 106 mg/dL — ABNORMAL HIGH (ref 70–99)
Glucose-Capillary: 109 mg/dL — ABNORMAL HIGH (ref 70–99)
Glucose-Capillary: 111 mg/dL — ABNORMAL HIGH (ref 70–99)
Glucose-Capillary: 112 mg/dL — ABNORMAL HIGH (ref 70–99)
Glucose-Capillary: 113 mg/dL — ABNORMAL HIGH (ref 70–99)
Glucose-Capillary: 113 mg/dL — ABNORMAL HIGH (ref 70–99)
Glucose-Capillary: 114 mg/dL — ABNORMAL HIGH (ref 70–99)
Glucose-Capillary: 114 mg/dL — ABNORMAL HIGH (ref 70–99)
Glucose-Capillary: 115 mg/dL — ABNORMAL HIGH (ref 70–99)
Glucose-Capillary: 116 mg/dL — ABNORMAL HIGH (ref 70–99)
Glucose-Capillary: 117 mg/dL — ABNORMAL HIGH (ref 70–99)
Glucose-Capillary: 118 mg/dL — ABNORMAL HIGH (ref 70–99)
Glucose-Capillary: 121 mg/dL — ABNORMAL HIGH (ref 70–99)
Glucose-Capillary: 121 mg/dL — ABNORMAL HIGH (ref 70–99)
Glucose-Capillary: 123 mg/dL — ABNORMAL HIGH (ref 70–99)
Glucose-Capillary: 124 mg/dL — ABNORMAL HIGH (ref 70–99)

## 2020-01-22 LAB — CBC WITH DIFFERENTIAL/PLATELET
Abs Immature Granulocytes: 0.67 10*3/uL — ABNORMAL HIGH (ref 0.00–0.07)
Basophils Absolute: 0.1 10*3/uL (ref 0.0–0.1)
Basophils Relative: 0 %
Eosinophils Absolute: 0.3 10*3/uL (ref 0.0–0.5)
Eosinophils Relative: 1 %
HCT: 26.4 % — ABNORMAL LOW (ref 39.0–52.0)
Hemoglobin: 8.1 g/dL — ABNORMAL LOW (ref 13.0–17.0)
Immature Granulocytes: 2 %
Lymphocytes Relative: 3 %
Lymphs Abs: 0.9 10*3/uL (ref 0.7–4.0)
MCH: 29 pg (ref 26.0–34.0)
MCHC: 30.7 g/dL (ref 30.0–36.0)
MCV: 94.6 fL (ref 80.0–100.0)
Monocytes Absolute: 2.3 10*3/uL — ABNORMAL HIGH (ref 0.1–1.0)
Monocytes Relative: 7 %
Neutro Abs: 27.1 10*3/uL — ABNORMAL HIGH (ref 1.7–7.7)
Neutrophils Relative %: 87 %
Platelets: 305 10*3/uL (ref 150–400)
RBC: 2.79 MIL/uL — ABNORMAL LOW (ref 4.22–5.81)
RDW: 22.2 % — ABNORMAL HIGH (ref 11.5–15.5)
WBC: 31.3 10*3/uL — ABNORMAL HIGH (ref 4.0–10.5)
nRBC: 1.1 % — ABNORMAL HIGH (ref 0.0–0.2)

## 2020-01-22 LAB — COMPREHENSIVE METABOLIC PANEL
ALT: 45 U/L — ABNORMAL HIGH (ref 0–44)
AST: 36 U/L (ref 15–41)
Albumin: 2 g/dL — ABNORMAL LOW (ref 3.5–5.0)
Alkaline Phosphatase: 122 U/L (ref 38–126)
Anion gap: 10 (ref 5–15)
BUN: 26 mg/dL — ABNORMAL HIGH (ref 6–20)
CO2: 23 mmol/L (ref 22–32)
Calcium: 8.2 mg/dL — ABNORMAL LOW (ref 8.9–10.3)
Chloride: 102 mmol/L (ref 98–111)
Creatinine, Ser: 1.93 mg/dL — ABNORMAL HIGH (ref 0.61–1.24)
GFR, Estimated: 41 mL/min — ABNORMAL LOW (ref >60)
Glucose, Bld: 112 mg/dL — ABNORMAL HIGH (ref 70–99)
Potassium: 3.9 mmol/L (ref 3.5–5.1)
Sodium: 135 mmol/L (ref 135–145)
Total Bilirubin: 1.1 mg/dL (ref 0.3–1.2)
Total Protein: 5.5 g/dL — ABNORMAL LOW (ref 6.5–8.1)

## 2020-01-22 LAB — CBC
HCT: 25.3 % — ABNORMAL LOW (ref 39.0–52.0)
Hemoglobin: 7.8 g/dL — ABNORMAL LOW (ref 13.0–17.0)
MCH: 29.1 pg (ref 26.0–34.0)
MCHC: 30.8 g/dL (ref 30.0–36.0)
MCV: 94.4 fL (ref 80.0–100.0)
Platelets: 313 10*3/uL (ref 150–400)
RBC: 2.68 MIL/uL — ABNORMAL LOW (ref 4.22–5.81)
RDW: 22.3 % — ABNORMAL HIGH (ref 11.5–15.5)
WBC: 31.9 10*3/uL — ABNORMAL HIGH (ref 4.0–10.5)
nRBC: 1.3 % — ABNORMAL HIGH (ref 0.0–0.2)

## 2020-01-22 LAB — RENAL FUNCTION PANEL
Albumin: 1.9 g/dL — ABNORMAL LOW (ref 3.5–5.0)
Anion gap: 12 (ref 5–15)
BUN: 26 mg/dL — ABNORMAL HIGH (ref 6–20)
CO2: 21 mmol/L — ABNORMAL LOW (ref 22–32)
Calcium: 8 mg/dL — ABNORMAL LOW (ref 8.9–10.3)
Chloride: 101 mmol/L (ref 98–111)
Creatinine, Ser: 1.87 mg/dL — ABNORMAL HIGH (ref 0.61–1.24)
GFR, Estimated: 43 mL/min — ABNORMAL LOW (ref >60)
Glucose, Bld: 110 mg/dL — ABNORMAL HIGH (ref 70–99)
Phosphorus: 2.5 mg/dL (ref 2.5–4.6)
Potassium: 3.9 mmol/L (ref 3.5–5.1)
Sodium: 134 mmol/L — ABNORMAL LOW (ref 135–145)

## 2020-01-22 LAB — MAGNESIUM: Magnesium: 2.5 mg/dL — ABNORMAL HIGH (ref 1.7–2.4)

## 2020-01-22 LAB — PHOSPHORUS: Phosphorus: 2.6 mg/dL (ref 2.5–4.6)

## 2020-01-22 LAB — HEPARIN LEVEL (UNFRACTIONATED): Heparin Unfractionated: <0.1 IU/mL — ABNORMAL LOW (ref 0.30–0.70)

## 2020-01-22 LAB — COOXEMETRY PANEL
Carboxyhemoglobin: 1.6 % — ABNORMAL HIGH (ref 0.5–1.5)
Methemoglobin: 1.3 % (ref 0.0–1.5)
O2 Saturation: 61 %
Total hemoglobin: 8 g/dL — ABNORMAL LOW (ref 12.0–16.0)

## 2020-01-22 LAB — VANCOMYCIN, TROUGH: Vancomycin Tr: 31 ug/mL (ref 15–20)

## 2020-01-22 LAB — LACTATE DEHYDROGENASE: LDH: 396 U/L — ABNORMAL HIGH (ref 98–192)

## 2020-01-22 MED ORDER — VANCOMYCIN VARIABLE DOSE PER UNSTABLE RENAL FUNCTION (PHARMACIST DOSING): Status: DC

## 2020-01-22 MED ORDER — DARBEPOETIN ALFA 60 MCG/0.3ML IJ SOSY
60.0000 ug | PREFILLED_SYRINGE | INTRAMUSCULAR | Status: DC
  Administered 2020-01-22: 18:00:00 60 ug via SUBCUTANEOUS
  Filled 2020-01-22 (×2): qty 0.3

## 2020-01-22 NOTE — Progress Notes (Signed)
Patient ID: Corey Palmer, male   DOB: 1970-02-23, 49 y.o.   MRN: [id number]  S:  Had to have emergent chest reopening late yesterday - ended up 2600 neg fluid balance but running even on CRRT-  Machine running well   O:BP 107/62   Pulse (!) 119   Temp 98.06 F (36.7 C)   Resp (!) 28   Ht '6\' 3"'  (1.905 m)   Wt 109.7 kg   SpO2 99%   BMI 30.23 kg/m   Intake/Output Summary (Last 24 hours) at 01/22/2020 0811 Last data filed at 01/22/2020 0700 Gross per 24 hour  Intake 5125.25 ml  Output 7715 ml  Net -2589.75 ml   Intake/Output: I/O last 3 completed shifts: In: 8077.3 [I.V.:6839.7; Other:299.7; NG/GT:288.3; IV Piggyback:649.6] Out: 57017 [Urine:10; Emesis/NG output:3000; BLTJQ:3009; Stool:300; Blood:100; Chest Tube:280]  Intake/Output this shift:  No intake/output data recorded. Weight change:  Gen: intubated and sedated CVS: RRR, open sternal wound Resp: cta Abd: +BS, soft, NT/ND Ext: 2+ edema to gravity dependent areas  Recent Labs  Lab 01/17/20 0324 01/17/20 0326 01/18/20 0430 01/18/20 1512 01/18/20 1525 01/19/20 0338 01/19/20 0444 01/19/20 1641 01/19/20 1652 01/10/2020 0308 01/04/2020 0310 01/07/2020 1652 01/07/2020 1725 12/28/2019 2008 01/21/20 0410 01/21/20 0412 01/21/20 1551 01/21/20 2007 01/22/20 0319 01/22/20 0336  NA  --    < > 136 136   < > 135   < > 135   < > 135   < > 134*   < > 137 135 134* 134* 135 135 137  K  --    < > 3.9 4.5   < > 4.0   < > 4.1   < > 4.2   < > 4.3   < > 4.4 4.5 4.4 4.4 4.3 3.9 3.9  CL  --    < > 104 102  --  103  --  103  --  102  --  101  --   --  103  --  101  --  102  --   CO2  --    < > 24 24  --  23  --  22  --  22  --  21*  --   --  21*  --  21*  --  23  --   GLUCOSE  --    < > 158* 123*  --  127*  --  156*  --  139*  --  125*  --   --  143*  --  127*  --  112*  --   BUN  --    < > 28* 27*  --  29*  --  32*  --  30*  --  33*  --   --  31*  --  29*  --  26*  --   CREATININE  --    < > 1.75* 1.73*  --  1.87*  --  1.91*  --  1.81*  --   2.20*  --   --  1.95*  --  1.78*  --  1.93*  --   ALBUMIN 2.1*   < > 2.2* 2.3*  --  2.2*  --  2.1*  --  2.1*  --  2.1*  --   --  2.3*  --  2.3*  --  2.0*  --   CALCIUM  --    < > 8.2* 8.2*  --  8.1*  --  7.9*  --  8.1*  --  8.5*  --   --  8.5*  --  8.4*  --  8.2*  --   PHOS  --    < > 2.8 2.9  --  2.8  --  2.4*  --  2.6  --  3.9  --   --   --   --  3.4  --  2.6  --   AST 172*  --  86*  --   --  59*  --   --   --  46*  --  42*  --   --  39  --   --   --  36  --   ALT 262*  --  183*  --   --  131*  --   --   --  89*  --  76*  --   --  62*  --   --   --  45*  --    < > = values in this interval not displayed.   Liver Function Tests: Recent Labs  Lab 01/28/2020 1652 01/21/20 0410 01/21/20 1551 01/22/20 0319  AST 42* 39  --  36  ALT 76* 62*  --  45*  ALKPHOS 162* 134*  --  122  BILITOT 1.2 1.3*  --  1.1  PROT 6.0* 6.2*  --  5.5*  ALBUMIN 2.1* 2.3* 2.3* 2.0*   No results for input(s): LIPASE, AMYLASE in the last 168 hours. No results for input(s): AMMONIA in the last 168 hours. CBC: Recent Labs  Lab 01/27/2020 0308 01/10/2020 0310 01/07/2020 1652 12/30/2019 1725 01/21/20 0410 01/21/20 0412 01/21/20 1551 01/21/20 2007 01/22/20 0319 01/22/20 0336  WBC 21.2*  --  26.3*  --  28.4*  --  33.8*  --  31.3*  --   NEUTROABS 16.2*  --   --   --  24.0*  --   --   --  27.1*  --   HGB 8.8*   < > 9.2*   < > 8.7*   < > 8.9* 8.8* 8.1* 9.9*  HCT 28.9*   < > 28.7*   < > 29.0*   < > 28.4* 26.0* 26.4* 29.0*  MCV 94.1  --  93.8  --  95.4  --  93.4  --  94.6  --   PLT 280  --  346  --  315  --  352  --  305  --    < > = values in this interval not displayed.   Cardiac Enzymes: No results for input(s): CKTOTAL, CKMB, CKMBINDEX, TROPONINI in the last 168 hours. CBG: Recent Labs  Lab 01/21/20 2329 01/22/20 0019 01/22/20 0123 01/22/20 0326 01/22/20 0641  GLUCAP 121* 121* 123* 112* 124*    Iron Studies: No results for input(s): IRON, TIBC, TRANSFERRIN, FERRITIN in the last 72  hours. Studies/Results: DG Chest 1 View  Result Date: 01/21/2020 CLINICAL DATA:  Persistent emesis.  History of open heart surgery EXAM: CHEST  1 VIEW COMPARISON:  Yesterday FINDINGS: Endotracheal tube with tip just below the clavicular heads. The enteric tube reaches the stomach. Chest tubes in place. Swan-Ganz catheter with tip at the right main pulmonary artery. Defibrillator lead into the right ventricle. Ventricular assist device in stable position. Unchanged hazy appearance of the bilateral chest, likely a combination of edema and atelectasis. No visible pneumothorax. Stable cardiomegaly. IMPRESSION: 1. Stable hardware positioning. 2. Stable edema and/or atelectasis symmetrically in the low volume lungs. Electronically Signed   By: Monte Fantasia  M.D.   On: 01/21/2020 08:33   DG Abd 1 View  Result Date: 01/21/2020 CLINICAL DATA:  Ileus. EXAM: ABDOMEN - 1 VIEW COMPARISON:  01/14/2020 FINDINGS: 0804 hours. Gaseous distention of stomach and colon evident. No substantial small bowel dilatation. NG tube tip is just above the esophagogastric junction. IMPRESSION: 1. NG tube tip is in the distal esophagus just above the gastroesophageal junction. 2. Gaseous distention of stomach and colon. These results will be called to the ordering clinician or representative by the Radiologist Assistant, and communication documented in the PACS or [employer]. Electronically Signed   By: Misty Stanley M.D.   On: 01/21/2020 08:34   DG Chest Portable 1 View  Result Date: 01/21/2020 CLINICAL DATA:  CABG, intubated EXAM: PORTABLE CHEST 1 VIEW COMPARISON:  01/24/2020 FINDINGS: Single frontal view of the chest demonstrates stable endotracheal tube tip at level of thoracic inlet. Enteric catheter passes below diaphragm tip excluded by collimation. Right-sided arterial line with Impella device unchanged. Right internal jugular flow directed central venous catheter tip overlies the main pulmonary outflow tract. Left  subclavian catheter tip overlies the superior vena cava. Stable single lead pacer/AICD. Interval placement of median sternotomy wires. There are bilateral chest tubes overlying the lung bases. Cardiac silhouette is enlarged. Consolidation at the lung bases likely reflects atelectasis. No effusion or pneumothorax. IMPRESSION: 1. Support devices as above. 2. Bibasilar hypoventilatory changes.  No effusion or pneumothorax. Electronically Signed   By: Randa Ngo M.D.   On: 12/31/2019 16:02   . sodium chloride   Intravenous Once  . aspirin  324 mg Per Tube Daily  . atorvastatin  80 mg Per Tube Daily  . B-complex with vitamin C  1 tablet Per Tube Daily  . busPIRone  10 mg Per Tube TID  . chlorhexidine gluconate (MEDLINE KIT)  15 mL Mouth Rinse BID  . Chlorhexidine Gluconate Cloth  6 each Topical Daily  . darbepoetin (ARANESP) injection - NON-DIALYSIS  60 mcg Subcutaneous Q Fri-1800  . feeding supplement (PIVOT 1.5 CAL)  1,000 mL Per Tube Q24H  . FLUoxetine  40 mg Per Tube BID  . mouth rinse  15 mL Mouth Rinse 10 times per day  . nystatin  5 mL Oral QID  . pantoprazole sodium  40 mg Per Tube Daily  . sodium chloride flush  10-40 mL Intracatheter Q12H  . sodium chloride flush  3 mL Intravenous Q12H  . valproic acid  250 mg Per Tube BID    BMET    Component Value Date/Time   NA 137 01/22/2020 0336   NA 138 09/08/2018 0924   K 3.9 01/22/2020 0336   CL 102 01/22/2020 0319   CO2 23 01/22/2020 0319   GLUCOSE 112 (H) 01/22/2020 0319   BUN 26 (H) 01/22/2020 0319   BUN 23 09/08/2018 0924   CREATININE 1.93 (H) 01/22/2020 0319   CREATININE 1.13 05/13/2013 1533   CALCIUM 8.2 (L) 01/22/2020 0319   GFRNONAA 41 (L) 01/22/2020 0319   GFRNONAA 79 05/13/2013 1533   GFRAA >60 03/08/2019 1515   GFRAA >89 05/13/2013 1533   CBC    Component Value Date/Time   WBC 31.3 (H) 01/22/2020 0319   RBC 2.79 (L) 01/22/2020 0319   HGB 9.9 (L) 01/22/2020 0336   HGB 16.2 03/23/2018 1535   HCT 29.0 (L)  01/22/2020 0336   HCT 47.0 03/23/2018 1535   PLT 305 01/22/2020 0319   PLT 251 03/23/2018 1535   MCV 94.6 01/22/2020 0319   MCV  82 03/23/2018 1535   MCH 29.0 01/22/2020 0319   MCHC 30.7 01/22/2020 0319   RDW 22.2 (H) 01/22/2020 0319   RDW 15.0 03/23/2018 1535   LYMPHSABS 0.9 01/22/2020 0319   LYMPHSABS 2.5 03/23/2018 1535   MONOABS 2.3 (H) 01/22/2020 0319   EOSABS 0.3 01/22/2020 0319   EOSABS 0.2 03/23/2018 1535   BASOSABS 0.1 01/22/2020 0319   BASOSABS 0.0 03/23/2018 1535     Assessment/Plan:  1. AKI/CKD stage III- presumably due to ischemic ATN in setting of cardiogenic shock s/p CABG.  CRRT started 01/16/20. All fluids 4K/2.5Ca: Pre-filter 500 ml/hr, post filter 200 ml/hr, dialysate 1500 ml/hr. UF goal was high to achieve chest closure-  Now to back off to running even 2. No heparin due to hematoma  3. Right femoral HD catheter placed 01/16/20  1. CAD s/p CABG complicated by cardiogenic shock and post-op hematoma. secondary closure on 12/23 then had to be re opened 2. Cardiogenic shock- underlying familial cardiomyopathy s/p redo CABG. Remains on pressors: Levophed, epinephrine, and vasopressin. Impella and milrinone.  1. Tolerated UF with CRRT to achieve chest closure- now will back off to keeping even so pressors can be weaned - d/w CTS- CVP now 11-13-  Keeping even- Will defer any additional volume removal to cards and CTS  3. ABLA- s/p postoperative bleeding into chest cavity s/p re-exploration for tamponade/hematoma. CT surgery following. stable- have added darbe 4. Hypophosphatemia- due to CRRT.  Repleted with K phos at some point-  Not critical- watch for now 5. Atrial fibrillation - s/p DCCV on 12/16 6. VDRF- per primary svc 7. ID - rising WBC. On vancomycin and meropenem.   Louis Meckel  [employer] [phone]

## 2020-01-22 NOTE — Progress Notes (Addendum)
[place] for heparin Indication: Impella 5.5  Allergies  Allergen Reactions  . Orange Fruit Anaphylaxis, Hives and Other (See Comments)    Per Pt- Blisters around lips and Hives all over, also  . Penicillins Anaphylaxis    Did it involve swelling of the face/tongue/throat, SOB, or low BP? Yes Did it involve sudden or severe rash/hives, skin peeling, or any reaction on the inside of your mouth or nose? Yes Did you need to seek medical attention at a hospital or doctor's office? No When did it last happen?childhood If all above answers are "NO", may proceed with cephalosporin use.  [name] [Empagliflozin] Itching  . [name] [Insulin Glargine] Nausea And Vomiting    Patient Measurements: Height: 6\' 3"  (190.5 cm) Weight: 109.7 kg (241 lb 13.5 oz) IBW/kg (Calculated) : 84.5 Heparin dosing wt: 101 kg  Vital Signs: Temp: 98.06 F (36.7 C) (12/25 1045) Temp Source: Core (Comment) (12/25 0800) BP: 107/62 (12/25 0409) Pulse Rate: 118 (12/25 1045)  Labs: Recent Labs    01/04/2020 0308 01/18/2020 0310 01/02/2020 1652 01/02/2020 1725 01/21/20 0410 01/21/20 0412 01/21/20 1551 01/21/20 2007 01/22/20 0319 01/22/20 0336  HGB 8.8*   < > 9.2*   < > 8.7*   < > 8.9* 8.8* 8.1* 9.9*  HCT 28.9*   < > 28.7*   < > 29.0*   < > 28.4* 26.0* 26.4* 29.0*  PLT 280  --  346  --  315  --  352  --  305  --   LABPROT  --   --  15.5*  --   --   --   --   --   --   --   INR  --   --  1.3*  --   --   --   --   --   --   --   HEPARINUNFRC <0.10*  --   --   --  <0.10*  --   --   --  <0.10*  --   CREATININE 1.81*  --  2.20*  --  1.95*  --  1.78*  --  1.93*  --    < > = values in this interval not displayed.    Estimated Creatinine Clearance: 60.6 mL/min (A) (by C-G formula based on SCr of 1.93 mg/dL (H)).   Assessment: 49 yo male s/p impella 5.5 placement. Pt started on heparinized purge and systemic heparin. Pt now s/p CABG 12/15 with Impella remaining  in place c/b significant bleeding and hematoma requiring opened chest and reexploration in OR 12/16. Chest closed on 12/23, re-opened on 12/24.  Impella on P7 with stable purge pressures. Heparin running via purge only at this time, purge flow rate 9.1 ml/hr (approx 455 units/hr heparin). Heparin level undetectable as expected, CBC stable this AM. LDH stable at 396. No s/sx of bleeding per nursing.   Goal of Therapy:  Heparin level 0.2-0.5 units/ml Monitor platelets by anticoagulation protocol: Yes   Plan:  Continue heparin via the purge only  Will follow with CVTS regarding systemic heparin once appropriate to initiate Daily heparin level for now   [name], PharmD, [place] Pharmacist  Phone: [phone] 01/22/2020 10:55 AM  Please check AMION for all [place] phone numbers After 10:00 PM, call [place] [phone]

## 2020-01-22 NOTE — Progress Notes (Addendum)
NAME:  Corey Palmer, MRN:  [id number], DOB:  August 31, 1970, LOS: 85 ADMISSION DATE:  01/18/2020, CONSULTATION DATE: 01/23/2020 REFERRING MD: Aundra Dubin, CHIEF COMPLAINT: Respiratory failure post Impella  HPI/course in hospital  49 year old man admitted with A. fib/RVR, torsades, cardiogenic shock [EF 15] requiring Impella, CABG x5, AKI requiring initiation of CRRT   Past Medical History:    has a past medical history of Anginal pain ([place]), Anxiety, Arthritis, Automatic implantable cardioverter-defibrillator in situ (2007), Bipolar disorder ([place]), CAD (coronary artery disease), Cancer ([place]) (2013), CHF (congestive heart failure) ([place]), Chondrocalcinosis of right knee (5/63/1497), Chronic systolic heart failure ([place]), Depression, Diabetes uncomplicated adult-type II, Dilated cardiomyopathy ([place]), Dyslipidemia, Dysrhythmia, GERD (gastroesophageal reflux disease), Gout, unspecified, Hepatic steatosis (2011), History of kidney stones, echocardiogram, Hypertension, Obesity (BMI 30.0-34.9), Pacemaker, Paroxysmal atrial fibrillation ([place]), Peptic ulcer, Shortness of breath, and Sleep apnea.  Significant Hospital Events:  12/2 Afib with RVR. Cardioverted in ED. AICD did not fire. Milrinone for low co ox.  12/7 ICD fired, Torsades- likely from low K of 2.8.  12/8 Cath- multivessel disease w/ low CO. Volume overloaded.  12/11 Underwent Impella 5.5 insertion for persistent symptoms of forward failure with low cardiac indices. 12/12 Back to the OR for displacement of Impella. Extubated.  12/15 CABG x4, with LIMA-LAD, SVG-PDA/PLV, SVG-OM 12/16 Emergently opened chest at bedside for tamponade, clot compressing right atrium  12/16 To OR for Exploration of chest due to bleeding  12/19 Started CRRT 12/23 Sternal Closure in OR 12/24 Worsening shock.  Chest reopened at bedside with improvement in hemodynamics.  No mediastinal hematoma.  Consults:  PCCM, nephrology, cardiology, cardiothoracic surgery  Procedures:     Significant Diagnostic Tests:    Micro Data:  Blood culture 12/14 > no growth Blood culture 12/16 > no growth Respiratory culture 12/20 > no growth Sternal wound culture 12/23 >  Antimicrobials:  Vancomycin 12/16 >> Meropenem 12/16 >>  Objective   Blood pressure 107/62, pulse (!) 119, temperature 98.06 F (36.7 C), resp. rate (!) 28, height 6\' 3"  (1.905 m), weight 109.7 kg, SpO2 99 %. PAP: (27-46)/(20-30) 36/26 CVP:  [10 mmHg-20 mmHg] 12 mmHg CO:  [4.1 L/min-7.3 L/min] 6.6 L/min CI:  [1.8 L/min/m2-3.2 L/min/m2] 2.8 L/min/m2  Vent Mode: PRVC FiO2 (%):  [40 %-100 %] 40 % Set Rate:  [28 bmp] 28 bmp Vt Set:  [510 mL] 510 mL PEEP:  [5 cmH20] 5 cmH20 Plateau Pressure:  [23 cmH20-26 cmH20] 26 cmH20   Intake/Output Summary (Last 24 hours) at 01/22/2020 0821 Last data filed at 01/22/2020 0800 Gross per 24 hour  Intake 5323.99 ml  Output 7940 ml  Net -2616.01 ml   Filed Weights   01/14/2020 0500 01/19/2020 0815 01/21/20 0500  Weight: 114 kg 112.9 kg 109.7 kg   PAP: (27-46)/(20-30) 36/26 CVP:  [10 mmHg-20 mmHg] 12 mmHg CO:  [4.1 L/min-7.3 L/min] 6.6 L/min CI:  [1.8 L/min/m2-3.2 L/min/m2] 2.8 L/min/m2  Examination: Gen:      No acute distress HEENT:  EOMI, sclera anicteric Neck:     No masses; no thyromegaly, ETT Lungs:    Open chest with dressing, CV:         Regular rate and rhythm; no murmurs Abd:      + bowel sounds; soft, non-tender; no palpable masses, no distension Ext:    No edema; adequate peripheral perfusion Skin:      Warm and dry; no rash Neuro: Sedated, unresponsive  Labs/imaging reviewed BUN/creatinine 26/1.93 mild mild elevation in transaminases WBC 31.3 Chest x-ray 12/25  with stable bilateral airspace disease  Assessment & Plan:  Acute hypoxic respiratory failure Continue vent support.  Unable to wean due to hemodynamic instability Keep well sedated Follow chest x-ray  Cardiogenic shock Acute on chronic systolic heart failure requiring  mechanical cardiac support with Impala and titration of inotropes Severe coronary artery disease status post CABG, complicated by cardiac tamponade, post hematoma removal in OR x 2 -Continue pressors, inotropes as ordered, weaning as able -Continue nitric oxide 20 ppm -on impella  Afib:  - Amio gtt   Bipolar affective Diabetes type 2 with hyperglycemia - Insulin infusion  Paroxysmal atrial fibrillation requiring amiodarone infusion.  Acute kidney injury-improving cardiorenal syndrome  Acute blood loss anemia -Remains on CRRT -On empiric antibiotics -Following BMP, electrolytes -Following CBC, goal hemoglobin 8.0 -on heparin gtt thru purge  Transaminitis:  -improving    Daily Goals Checklist  Pain/Anxiety/Delirium protocol (if indicated): versed and fentanyl infusions.  VAP protocol (if indicated): per protocol DVT prophylaxis: SCD GI prophylaxis: Protonix Glucose control: insulin infusion Code Status: Full code Family Communication: Per primary team but able to update wife at bedside re: vent Disposition: ICU  Critical care time:   The patient is critically ill with multiple organ system failure and requires high complexity decision making for assessment and support, frequent evaluation and titration of therapies, advanced monitoring, review of radiographic studies and interpretation of complex data.   Critical Care Time devoted to patient care services, exclusive of separately billable procedures, described in this note is 35 minutes.   Marshell Garfinkel MD [facility] Please see [email or web address] for pager details.  01/22/2020, 8:21 AM

## 2020-01-22 NOTE — Progress Notes (Signed)
Pharmacy Antibiotic Note  Corey Palmer is a 49 y.o. male admitted on 01/18/2020  s/p CABG with Impella placement, remains with open chest with concerns for possible sepsis. Pharmacy has been consulted for vancomycin and meropenem dosing.  CRRT for volume removal in order to optimize for chest closure. Vanc trough came back elevated at 31, already received dose on 12/25. WBC 31, Scr 1.93 on CRRT, LA 1.3. Chest re-opened last night.   Plan: Hold vancomycin dose on 12/26, will reorder vancomycin random and restart  Continue meropenem 1 gm IV q8 hrs Monitor renal function, cultures, clinical progression Check vancomycin levels as indicated.  Height: 6\' 3"  (190.5 cm) Weight: 109.7 kg (241 lb 13.5 oz) IBW/kg (Calculated) : 84.5  Temp (24hrs), Avg:98 F (36.7 C), Min:96.8 F (36 C), Max:98.96 F (37.2 C)  Recent Labs  Lab 01/17/20 0325 01/17/20 0326 01/17/20 1525 01/18/20 0355 01/18/20 0430 01/18/20 1512 01/18/20 1517 01/19/20 0338 01/04/2020 0308 01/05/2020 1652 01/21/20 0410 01/21/20 1551 01/22/20 0319 01/22/20 1208  WBC  --    < > 16.8*  --    < >  --   --    < > 21.2* 26.3* 28.4* 33.8* 31.3*  --   CREATININE  --    < > 1.66*  --    < > 1.73*  --    < > 1.81* 2.20* 1.95* 1.78* 1.93*  --   LATICACIDVEN 0.9  --  0.7 0.7  --   --  0.7  --   --   --   --  1.3  --   --   VANCOTROUGH  --   --   --   --   --  27*  --   --   --   --   --   --   --  31*   < > = values in this interval not displayed.    Estimated Creatinine Clearance: 60.6 mL/min (A) (by C-G formula based on SCr of 1.93 mg/dL (H)).    Allergies  Allergen Reactions  . Orange Fruit Anaphylaxis, Hives and Other (See Comments)    Per Pt- Blisters around lips and Hives all over, also  . Penicillins Anaphylaxis    Did it involve swelling of the face/tongue/throat, SOB, or low BP? Yes Did it involve sudden or severe rash/hives, skin peeling, or any reaction on the inside of your mouth or nose? Yes Did you need to seek  medical attention at a hospital or doctor's office? No When did it last happen?childhood If all above answers are "NO", may proceed with cephalosporin use.  [name] [Empagliflozin] Itching  . [name] [Insulin Glargine] Nausea And Vomiting    Antimicrobials this admission: Vancomycin 12/15 >>  Meropenem 12/16 >>   Dose adjustments this admission: 12/18: VT 26 - decrease to 750 mg IV q12 hrs 12/19: starting CRRT - adjust Vanc to 1500 mg IV q24 hrs 12/21 VT 27 - decrease Vanc to 1250 q24hr 12/25 VT 31 - hold vancomycin for 12/26  Microbiology results: 12/16 BCx: ngtd 12/14 BCx: ngtd  12/11 MRSA PCR: neg  [name], PharmD, BCCCP Clinical Pharmacist  Phone: [phone] 01/22/2020 3:59 PM  Please check AMION for all [place] phone numbers After 10:00 PM, call [place] [phone]

## 2020-01-22 NOTE — Progress Notes (Signed)
2 Days Post-Op Procedure(s) (LRB): STERNAL CLOSURE (N/A) TRANSESOPHAGEAL ECHOCARDIOGRAM (TEE) (N/A) APPLICATION OF WOUND VAC (N/A) Subjective:  Sedated on vent  CI 2.8 PA 35/25 CVP 12 Co-ox 61  Milrinone 0.25 Epi 10 NE 47 Vaso 0.04 Impella P8  Objective: Vital signs in last 24 hours: Temp:  [96.4 F (35.8 C)-98.78 F (37.1 C)] 97.88 F (36.6 C) (12/25 0930) Pulse Rate:  [91-135] 125 (12/25 0930) Cardiac Rhythm: Atrial fibrillation;Sinus tachycardia (12/25 0800) Resp:  [25-28] 28 (12/25 0930) BP: (90-107)/(56-62) 107/62 (12/25 0409) SpO2:  [89 %-100 %] 99 % (12/25 0930) Arterial Line BP: (76-146)/(6-87) 95/56 (12/25 0930) FiO2 (%):  [40 %-100 %] 40 % (12/25 0800)  Hemodynamic parameters for last 24 hours: PAP: (27-46)/(20-30) 35/25 CVP:  [10 mmHg-19 mmHg] 12 mmHg CO:  [4.1 L/min-7.3 L/min] 6.4 L/min CI:  [1.8 L/min/m2-3.2 L/min/m2] 2.8 L/min/m2  Intake/Output from previous day: 12/24 0701 - 12/25 0700 In: 5324.5 [I.V.:4449.3; NG/GT:130; IV Piggyback:549.6] Out: 6269 [Emesis/NG output:2750; Stool:200; Blood:100; Chest Tube:200] Intake/Output this shift: Total I/O In: 388.5 [I.V.:379.4; Other:9.1] Out: 485 [Emesis/NG output:50; Other:351; Stool:225]  General appearance: anasarca Neurologic: sedated on vent. not following commands Heart: regular rate and rhythm, tachy, S1, S2 normal, no murmur Lungs: coarse Abdomen: soft, no bowel sounds  Extremities: warm, not mottled Wound: open chest dressing intact  Lab Results: Recent Labs    01/21/20 1551 01/21/20 2007 01/22/20 0319 01/22/20 0336  WBC 33.8*  --  31.3*  --   HGB 8.9*   < > 8.1* 9.9*  HCT 28.4*   < > 26.4* 29.0*  PLT 352  --  305  --    < > = values in this interval not displayed.   BMET:  Recent Labs    01/21/20 1551 01/21/20 2007 01/22/20 0319 01/22/20 0336  NA 134*   < > 135 137  K 4.4   < > 3.9 3.9  CL 101  --  102  --   CO2 21*  --  23  --   GLUCOSE 127*  --  112*  --   BUN 29*  --   26*  --   CREATININE 1.78*  --  1.93*  --   CALCIUM 8.4*  --  8.2*  --    < > = values in this interval not displayed.    PT/INR:  Recent Labs    01/17/2020 1652  LABPROT 15.5*  INR 1.3*   ABG    Component Value Date/Time   PHART 7.338 (L) 01/22/2020 0336   HCO3 23.7 01/22/2020 0336   TCO2 25 01/22/2020 0336   ACIDBASEDEF 2.0 01/22/2020 0336   O2SAT 98.0 01/22/2020 0336   CBG (last 3)  Recent Labs    01/22/20 0123 01/22/20 0326 01/22/20 0641  GLUCAP 123* 112* 124*    Assessment/Plan:  Hemodynamically stable but on very high dose pressors with marginal MAP 60. Question if he is septic although Co-ox has been on the low side suggesting cardiogenic shock. Continue current support per heart failure team.  VDRF: per CCM  AKI on CKD: continue CRRT to keep even. Can't remove volume on these very high dose pressors.  Question sepsis with high pressor requirement, WBC continues to rise. Has been on vanc and Merrem. Probably worth re-culturing.   No BS so will hold off on resuming tube feeds today.       LOS: 23 days    Gaye Pollack 01/22/2020

## 2020-01-22 NOTE — Progress Notes (Signed)
Patient ID: Corey Palmer, male   DOB: 11/09/1970, 49 y.o.   MRN: [id number]     Advanced Heart Failure Rounding Note  PCP-Cardiologist: No primary care provider on file.   Subjective:    - 12/7 Torsades -ICD shock x1.  - 12/11 Impella 5.5 placed.  - 12/12 Impella repositioned in OR - 12/15 CABG x 4 with LIMA-LAD, SVG-PDA/PLV, SVG-OM - 12/16 DCCV afib.  Back to OR to re-open chest and again to evacuate hematoma.  - 12/19 CVVH initiated  - 12/23 Chest closed - 12/24 Chest reopened  Chest re-opened yesterday. On NE 42, Epi 10, VP 0.04. milrinone 0.25. Co-ox 36% -> 61%  MAP 60-70s. Keeping even on CVVHD. CVP 9-10   Eyes open but not responding to commands.   Swan numbers: CVP 9-10 PA 33/24 CO/CI 6.4/2.8 Co-ox 61% PAPi 1.0  Impella 5.5 P-7 Flow 4.0 Heparin only in purge Waveforms ok. Pulsativity increased   Echo (12/10): EF <20%, moderate LV dilation, normal RV size with mildly decreased systolic function.    Objective:   Weight Range: 109.7 kg Body mass index is 30.23 kg/m.   Vital Signs:   Temp:  [96.8 F (36 C)-98.96 F (37.2 C)] 98.96 F (37.2 C) (12/25 1445) Pulse Rate:  [94-135] 134 (12/25 1445) Resp:  [25-28] 28 (12/25 1445) BP: (90-107)/(56-62) 107/62 (12/25 0409) SpO2:  [92 %-100 %] 98 % (12/25 1445) Arterial Line BP: (77-146)/(6-87) 85/52 (12/25 1445) FiO2 (%):  [40 %-100 %] 40 % (12/25 1200) Last BM Date: 01/22/20  Weight change: Filed Weights   01/27/2020 0500 01/28/2020 0815 01/21/20 0500  Weight: 114 kg 112.9 kg 109.7 kg    Intake/Output:   Intake/Output Summary (Last 24 hours) at 01/22/2020 1455 Last data filed at 01/22/2020 1400 Gross per 24 hour  Intake 5277.1 ml  Output 5423 ml  Net -145.9 ml      Physical Exam   General:  Critically-ill appearing. On vent  HEENT: + ETT Neck: supple. RIJ swan. Cor: PMI nondisplaced. Chest open. RRR +CTs. Lungs: coarse Abdomen: obese soft, nontender, nondistended. No hepatosplenomegaly. No  bruits or masses. Good bowel sounds. Extremities: no cyanosis, clubbing, rash, 2+ edema Neuro: eyes open but not following commands   Telemetry    Afib 110-120s Personally reviewed   Labs    CBC Recent Labs    01/21/20 0410 01/21/20 0412 01/21/20 1551 01/21/20 2007 01/22/20 0319 01/22/20 0336  WBC 28.4*  --  33.8*  --  31.3*  --   NEUTROABS 24.0*  --   --   --  27.1*  --   HGB 8.7*   < > 8.9*   < > 8.1* 9.9*  HCT 29.0*   < > 28.4*   < > 26.4* 29.0*  MCV 95.4  --  93.4  --  94.6  --   PLT 315  --  352  --  305  --    < > = values in this interval not displayed.   Basic Metabolic Panel Recent Labs    01/21/20 0410 01/21/20 0412 01/21/20 1551 01/21/20 2007 01/22/20 0319 01/22/20 0336  NA 135   < > 134*   < > 135 137  K 4.5   < > 4.4   < > 3.9 3.9  CL 103  --  101  --  102  --   CO2 21*  --  21*  --  23  --   GLUCOSE 143*  --  127*  --  112*  --  BUN 31*  --  29*  --  26*  --   CREATININE 1.95*  --  1.78*  --  1.93*  --   CALCIUM 8.5*  --  8.4*  --  8.2*  --   MG 2.8*  --   --   --  2.5*  --   PHOS  --   --  3.4  --  2.6  --    < > = values in this interval not displayed.   Liver Function Tests Recent Labs    01/21/20 0410 01/21/20 1551 01/22/20 0319  AST 39  --  36  ALT 62*  --  45*  ALKPHOS 134*  --  122  BILITOT 1.3*  --  1.1  PROT 6.2*  --  5.5*  ALBUMIN 2.3* 2.3* 2.0*   No results for input(s): LIPASE, AMYLASE in the last 72 hours. Cardiac Enzymes No results for input(s): CKTOTAL, CKMB, CKMBINDEX, TROPONINI in the last 72 hours.  BNP: BNP (last 3 results) Recent Labs    01/17/2020 1619  BNP 577.5*    ProBNP (last 3 results) No results for input(s): PROBNP in the last 8760 hours.   D-Dimer No results for input(s): DDIMER in the last 72 hours. Hemoglobin A1C No results for input(s): HGBA1C in the last 72 hours. Fasting Lipid Panel No results for input(s): CHOL, HDL, LDLCALC, TRIG, CHOLHDL, LDLDIRECT in the last 72 hours. Thyroid  Function Tests No results for input(s): TSH, T4TOTAL, T3FREE, THYROIDAB in the last 72 hours.  Invalid input(s): FREET3  Other results:   Imaging    DG Chest Port 1 View  Result Date: 01/22/2020 CLINICAL DATA:  Recent open heart surgery. EXAM: PORTABLE CHEST 1 VIEW COMPARISON:  01/21/2020 FINDINGS: Left-sided pacemaker unchanged. Right IJ Swan-Ganz catheter has tip over the origin of the right lower lobar pulmonary artery. Nasogastric tube coiled once over the stomach with tip over the gastric cardia. Endotracheal tube has tip 3.3 cm above the carina. Right-sided Impella device unchanged. Bilateral chest tubes unchanged. Mediastinal drain present. Lungs are hypoinflated with continued hazy prominence of the perihilar vasculature suggesting mild vascular congestion. Possible linear atelectasis over the left midlung/retrocardiac region. No pneumothorax. Suggestion of skin fold over the left apex. Stable cardiomegaly. Remainder the exam is unchanged. IMPRESSION: 1. Stable cardiomegaly with suggestion of mild vascular congestion unchanged. Suggestion of linear atelectasis over the left midlung/retrocardiac region. 2. Tubes and lines as described. No pneumothorax. Electronically Signed   By: Marin Olp M.D.   On: 01/22/2020 08:39     Medications:     Scheduled Medications: . sodium chloride   Intravenous Once  . aspirin  324 mg Per Tube Daily  . atorvastatin  80 mg Per Tube Daily  . B-complex with vitamin C  1 tablet Per Tube Daily  . busPIRone  10 mg Per Tube TID  . chlorhexidine gluconate (MEDLINE KIT)  15 mL Mouth Rinse BID  . Chlorhexidine Gluconate Cloth  6 each Topical Daily  . darbepoetin (ARANESP) injection - NON-DIALYSIS  60 mcg Subcutaneous Q Sat-1800  . feeding supplement (PIVOT 1.5 CAL)  1,000 mL Per Tube Q24H  . FLUoxetine  40 mg Per Tube BID  . mouth rinse  15 mL Mouth Rinse 10 times per day  . nystatin  5 mL Oral QID  . pantoprazole sodium  40 mg Per Tube Daily  .  sodium chloride flush  10-40 mL Intracatheter Q12H  . sodium chloride flush  3 mL Intravenous Q12H  .  valproic acid  250 mg Per Tube BID    Infusions: .  prismasol BGK 4/2.5 500 mL/hr at 01/22/20 1436  .  prismasol BGK 4/2.5 200 mL/hr at 01/22/20 1438  . sodium chloride Stopped (01/10/2020 1646)  . sodium chloride Stopped (01/22/20 1307)  . sodium chloride Stopped (01/16/20 1032)  . amiodarone 60 mg/hr (01/22/20 1400)  . DOPamine Stopped (01/21/20 1559)  . epinephrine 10 mcg/min (01/22/20 1340)  . fentaNYL infusion INTRAVENOUS 200 mcg/hr (01/22/20 1400)  . impella catheter heparin 50 unit/mL in dextrose 5% 9 mL/hr at 01/21/20 0015  . insulin 1.8 Units/hr (01/22/20 0200)  . lactated ringers 20 mL/hr at 01/22/20 1400  . meropenem (MERREM) IV Stopped (01/22/20 0943)  . midazolam 2 mg/hr (01/22/20 1400)  . milrinone 0.25 mcg/kg/min (01/22/20 0200)  . norepinephrine (LEVOPHED) Adult infusion 42 mcg/min (01/22/20 1029)  . prismasol BGK 4/2.5 1,500 mL/hr at 01/22/20 1336  . vancomycin 166.7 mL/hr at 01/22/20 1400  . vasopressin 0.04 Units/min (01/22/20 1400)    PRN Medications: sodium chloride, sodium chloride, bisacodyl **OR** bisacodyl, dextrose, docusate, fentaNYL, fentaNYL (SUBLIMAZE) injection, fentaNYL (SUBLIMAZE) injection, heparin, heparin, metoprolol tartrate, midazolam, midazolam, midazolam, ondansetron (ZOFRAN) IV, oxyCODONE, polyethylene glycol, sodium chloride flush, sodium chloride flush     Assessment/Plan   1. Cardiogenic shock/acute on chronic systolic CHF: Initially nonischemic cardiomyopathy. Possible familial cardiomyopathy as both parents had cardiomyopathy and died at around 68.However, [employer] gene testing did not show any common mutation for cardiomyopathy.  However, this admission noted to have severe 3 vessel disease so suspect component of ischemic cardiomyopathy. St Jude ICD. Echo in 8/20 with EF 15% and mildly decreased RV function. Echo this admission with  EF < 20%, moderate LV dilation, RV mildly reduced, severe LAE, no significant MR.Low output HF with markedly low EF.  He has a long history of cardiomyopathy (20 yrs), tends to minimize symptoms.  Cardiorenal syndrome with creatinine up to 2.2,  stabilized on milrinone and Impella 5.5. SCr 1.44 day of CABG. CABG 12/15.  Post-op shock, back to OR 12/16 to open chest (chest wall compartment syndrome) and again to evacuate hematoma.  TEE post-op with severe RV dysfunction.  CVVHD initiated 12/19 for volume removal in setting of rising Scr/decreased UOP. Suspect ATN. S/p chest closure 12/23. Hemodynamics much worse on 12/24 with rising pressor demands. Co-ox 36% Bedside echo severe biventricular dysfunction with marked septal bounce. Chest re-opened 12/24 - On high-dose pressors (NE 42, Epi 10, VP 0.04, milrinone 0.25)  MAPS 60-70s - Co-ox improved to 61%  - Volume overloaded but keeping even on CVVHD due to soft BPs. Will try to pull -50/hr as BP permits - Continue Impella at current level (P-7) Waveforms ok  2. Atrial fibrillation: H/o PAF.  He was on dofetilide in the remote past but this was stopped due to noncompliance. He had an upper GI bleed from antral ulcers in 3/12. He was seen by GI and was deemed safe to restart anticoagulation as long as he remains on a PPI.  No apparent recurrence of AF until just prior to this admission, was cardioverted back to NSR in ER but went back into atrial fibrillation.  Post-op afib, DCCV to NSR/BiV pacing am 12/16, now back in atrial fibrillation Remains in AF with RVR in 120s - Continue IV amio - Would not try to cardiovert again until back on heparin gtt and vasoactive meds weaned.  - Off heparin post-op with bleeding, only getting heparin in Impella purge currently. Unable to restart [facility] currently  with high risk of bleeding.  - Unable to do Maze with CABG.    3. CAD:   Cath this admission with severe 3 vessel disease. CABG x 4 on 12/15.  - Continue atorvastatin,  ASA.  - continue current management 4. Acute on chronic hypoxemic respiratory failure: Pulmonary edema, vent per CCM.  - Remains on vent - CCM following 5. AKI on CKD stage 3: Likely ATN/cardiorenal - remains on CVVHD. Keeping even for now with hemodynamic instability. Pull -50/hr as will tolerate - Continue CVVHD per Renal. Appreciate their help.  6. Diabetes: Insulin.  7. Hyponatremia: Resolved.  8. Torsades/ICD shock: 12/7/212 in hospital event, in setting of severe hypokalemia and hypomagnesemia.   9. Gout: h/o gout. Complained of rt knee pain c/w previous flares - treated w/ prednisone burst (completed).  10: ID: Tm 99.3 PCT 13.46 -> 8.65  Cultures NGTD.  - WBC up 26-> 28K -> 31K - On vancomycin/meropenem (PCN allergy).  - can add fungal coverage as needed   Total time spent 45 minutes. Over half that time spent discussing above. D/w TCTS personally.   Glori Bickers MD 01/22/2020 2:55 PM

## 2020-01-23 LAB — RENAL FUNCTION PANEL
Albumin: 1.8 g/dL — ABNORMAL LOW (ref 3.5–5.0)
Albumin: 1.8 g/dL — ABNORMAL LOW (ref 3.5–5.0)
Anion gap: 8 (ref 5–15)
Anion gap: 8 (ref 5–15)
BUN: 21 mg/dL — ABNORMAL HIGH (ref 6–20)
BUN: 19 mg/dL (ref 6–20)
CO2: 23 mmol/L (ref 22–32)
CO2: 21 mmol/L — ABNORMAL LOW (ref 22–32)
Calcium: 7.9 mg/dL — ABNORMAL LOW (ref 8.9–10.3)
Calcium: 7.7 mg/dL — ABNORMAL LOW (ref 8.9–10.3)
Chloride: 103 mmol/L (ref 98–111)
Chloride: 103 mmol/L (ref 98–111)
Creatinine, Ser: 1.84 mg/dL — ABNORMAL HIGH (ref 0.61–1.24)
Creatinine, Ser: 1.9 mg/dL — ABNORMAL HIGH (ref 0.61–1.24)
GFR, Estimated: 44 mL/min — ABNORMAL LOW (ref >60)
GFR, Estimated: 42 mL/min — ABNORMAL LOW (ref >60)
Glucose, Bld: 111 mg/dL — ABNORMAL HIGH (ref 70–99)
Glucose, Bld: 110 mg/dL — ABNORMAL HIGH (ref 70–99)
Phosphorus: 2.3 mg/dL — ABNORMAL LOW (ref 2.5–4.6)
Phosphorus: 2.1 mg/dL — ABNORMAL LOW (ref 2.5–4.6)
Potassium: 4 mmol/L (ref 3.5–5.1)
Potassium: 3.8 mmol/L (ref 3.5–5.1)
Sodium: 134 mmol/L — ABNORMAL LOW (ref 135–145)
Sodium: 132 mmol/L — ABNORMAL LOW (ref 135–145)

## 2020-01-23 LAB — CBC WITH DIFFERENTIAL/PLATELET
Abs Immature Granulocytes: 0.48 10*3/uL — ABNORMAL HIGH (ref 0.00–0.07)
Basophils Absolute: 0.1 10*3/uL (ref 0.0–0.1)
Basophils Relative: 0 %
Eosinophils Absolute: 0.3 10*3/uL (ref 0.0–0.5)
Eosinophils Relative: 1 %
HCT: 23.8 % — ABNORMAL LOW (ref 39.0–52.0)
Hemoglobin: 7.3 g/dL — ABNORMAL LOW (ref 13.0–17.0)
Immature Granulocytes: 2 %
Lymphocytes Relative: 3 %
Lymphs Abs: 0.8 10*3/uL (ref 0.7–4.0)
MCH: 28.6 pg (ref 26.0–34.0)
MCHC: 30.7 g/dL (ref 30.0–36.0)
MCV: 93.3 fL (ref 80.0–100.0)
Monocytes Absolute: 2 10*3/uL — ABNORMAL HIGH (ref 0.1–1.0)
Monocytes Relative: 7 %
Neutro Abs: 24.1 10*3/uL — ABNORMAL HIGH (ref 1.7–7.7)
Neutrophils Relative %: 87 %
Platelets: 296 10*3/uL (ref 150–400)
RBC: 2.55 MIL/uL — ABNORMAL LOW (ref 4.22–5.81)
RDW: 22.6 % — ABNORMAL HIGH (ref 11.5–15.5)
WBC: 27.8 10*3/uL — ABNORMAL HIGH (ref 4.0–10.5)
nRBC: 1.3 % — ABNORMAL HIGH (ref 0.0–0.2)

## 2020-01-23 LAB — COOXEMETRY PANEL
Carboxyhemoglobin: 1.7 % — ABNORMAL HIGH (ref 0.5–1.5)
Methemoglobin: 1.5 % (ref 0.0–1.5)
O2 Saturation: 63 %
Total hemoglobin: 6.8 g/dL — CL (ref 12.0–16.0)

## 2020-01-23 LAB — POCT I-STAT 7, (LYTES, BLD GAS, ICA,H+H)
Acid-base deficit: 1 mmol/L (ref 0.0–2.0)
Bicarbonate: 24.3 mmol/L (ref 20.0–28.0)
Calcium, Ion: 1.19 mmol/L (ref 1.15–1.40)
HCT: 23 % — ABNORMAL LOW (ref 39.0–52.0)
Hemoglobin: 7.8 g/dL — ABNORMAL LOW (ref 13.0–17.0)
O2 Saturation: 97 %
Patient temperature: 36.8
Potassium: 4 mmol/L (ref 3.5–5.1)
Sodium: 136 mmol/L (ref 135–145)
TCO2: 25 mmol/L (ref 22–32)
pCO2 arterial: 41.1 mmHg (ref 32.0–48.0)
pH, Arterial: 7.378 (ref 7.350–7.450)
pO2, Arterial: 92 mmHg (ref 83.0–108.0)

## 2020-01-23 LAB — BLOOD GAS, ARTERIAL
Acid-base deficit: 1.5 mmol/L (ref 0.0–2.0)
Bicarbonate: 23.2 mmol/L (ref 20.0–28.0)
FIO2: 40
O2 Saturation: 98.7 %
Patient temperature: 37
pCO2 arterial: 42.5 mmHg (ref 32.0–48.0)
pH, Arterial: 7.357 (ref 7.350–7.450)
pO2, Arterial: 118 mmHg — ABNORMAL HIGH (ref 83.0–108.0)

## 2020-01-23 LAB — GLUCOSE, CAPILLARY
Glucose-Capillary: 119 mg/dL — ABNORMAL HIGH (ref 70–99)
Glucose-Capillary: 102 mg/dL — ABNORMAL HIGH (ref 70–99)
Glucose-Capillary: 104 mg/dL — ABNORMAL HIGH (ref 70–99)
Glucose-Capillary: 107 mg/dL — ABNORMAL HIGH (ref 70–99)
Glucose-Capillary: 111 mg/dL — ABNORMAL HIGH (ref 70–99)
Glucose-Capillary: 110 mg/dL — ABNORMAL HIGH (ref 70–99)
Glucose-Capillary: 122 mg/dL — ABNORMAL HIGH (ref 70–99)
Glucose-Capillary: 111 mg/dL — ABNORMAL HIGH (ref 70–99)
Glucose-Capillary: 125 mg/dL — ABNORMAL HIGH (ref 70–99)
Glucose-Capillary: 107 mg/dL — ABNORMAL HIGH (ref 70–99)
Glucose-Capillary: 108 mg/dL — ABNORMAL HIGH (ref 70–99)
Glucose-Capillary: 108 mg/dL — ABNORMAL HIGH (ref 70–99)
Glucose-Capillary: 112 mg/dL — ABNORMAL HIGH (ref 70–99)
Glucose-Capillary: 124 mg/dL — ABNORMAL HIGH (ref 70–99)

## 2020-01-23 LAB — HEPARIN LEVEL (UNFRACTIONATED): Heparin Unfractionated: <0.1 IU/mL — ABNORMAL LOW (ref 0.30–0.70)

## 2020-01-23 LAB — PREPARE RBC (CROSSMATCH)

## 2020-01-23 LAB — LACTATE DEHYDROGENASE: LDH: 366 U/L — ABNORMAL HIGH (ref 98–192)

## 2020-01-23 LAB — MAGNESIUM: Magnesium: 2.5 mg/dL — ABNORMAL HIGH (ref 1.7–2.4)

## 2020-01-23 MED ORDER — SODIUM CHLORIDE 0.9% IV SOLUTION: Freq: Once | INTRAVENOUS | Status: AC

## 2020-01-23 NOTE — Progress Notes (Signed)
Patient ID: Corey Palmer, male   DOB: 01/20/1971, 49 y.o.   MRN: [id number]     Advanced Heart Failure Rounding Note  PCP-Cardiologist: No primary care provider on file.   Subjective:    - 12/7 Torsades -ICD shock x1.  - 12/11 Impella 5.5 placed.  - 12/12 Impella repositioned in OR - 12/15 CABG x 4 with LIMA-LAD, SVG-PDA/PLV, SVG-OM - 12/16 DCCV afib.  Back to OR to re-open chest and again to evacuate hematoma.  - 12/19 CVVH initiated  - 12/23 Chest closed - 12/24 Chest reopened - 12/26 RAP back into NSR  Chest re-opened 12/24.   On NE 42 -> 44, Epi 10 -> 12, VP 0.04, milrinone 0.25, iNO and Impella. Co-ox 63%  MAP 70-80s. Keeping even on CVVHD. CVP 7-8. Underwent rapid atrial pacing from AFL -> SR  Eyes open but not responding to commands.   Swan numbers: CVP 7 PA 24/17 CO/CI 7.6/3.3 Co-ox 63% PAPi 1.0  Impella 5.5 P-7 Flow 3.9  Heparin only in purge Waveforms ok. No alarms   Echo (12/10): EF <20%, moderate LV dilation, normal RV size with mildly decreased systolic function.    Objective:   Weight Range: 109 kg Body mass index is 30.04 kg/m.   Vital Signs:   Temp:  [95.9 F (35.5 C)-98.96 F (37.2 C)] 98.96 F (37.2 C) (12/26 1215) Pulse Rate:  [88-135] 90 (12/26 1215) Resp:  [25-28] 28 (12/26 1215) BP: (100-107)/(57-61) 100/57 (12/26 0337) SpO2:  [89 %-100 %] 98 % (12/26 1215) Arterial Line BP: (80-120)/(50-69) 111/55 (12/26 1215) FiO2 (%):  [40 %-50 %] 40 % (12/26 1200) Weight:  [109 kg] 109 kg (12/26 0500) Last BM Date: 01/23/20  Weight change: Filed Weights   12/31/2019 0815 01/21/20 0500 01/23/20 0500  Weight: 112.9 kg 109.7 kg 109 kg    Intake/Output:   Intake/Output Summary (Last 24 hours) at 01/23/2020 1234 Last data filed at 01/23/2020 1200 Gross per 24 hour  Intake 5655.39 ml  Output 2894 ml  Net 2761.39 ml      Physical Exam   General:  Critically-ill appearing. On vent  HEENT: normal +ETT Neck: supple. RIJ swan  Cor:  chest open. +CTs. Regular rate & rhythm.  Lungs: coarse Abdomen: soft, nontender, nondistended. No hepatosplenomegaly. No bruits or masses. Good bowel sounds. Extremities: no cyanosis, clubbing, rash, 1-2+ edema Neuro: sedated on vent. eyes open but not following commands  Telemetry   NSR 80-90 Personally reviewed   Labs    CBC Recent Labs    01/22/20 0319 01/22/20 0336 01/22/20 1604 01/22/20 2340 01/23/20 0415 01/23/20 0426  WBC 31.3*  --  31.9*  --  27.8*  --   NEUTROABS 27.1*  --   --   --  24.1*  --   HGB 8.1*   < > 7.8*   < > 7.3* 7.8*  HCT 26.4*   < > 25.3*   < > 23.8* 23.0*  MCV 94.6  --  94.4  --  93.3  --   PLT 305  --  313  --  296  --    < > = values in this interval not displayed.   Basic Metabolic Panel Recent Labs    01/22/20 0319 01/22/20 0336 01/22/20 1546 01/22/20 1553 01/23/20 0415 01/23/20 0426  NA 135   < > 134*   < > 134* 136  K 3.9   < > 3.9   < > 4.0 4.0  CL 102  --  101  --    103  --   CO2 23  --  21*  --  23  --   GLUCOSE 112*  --  110*  --  111*  --   BUN 26*  --  26*  --  21*  --   CREATININE 1.93*  --  1.87*  --  1.84*  --   CALCIUM 8.2*  --  8.0*  --  7.9*  --   MG 2.5*  --   --   --  2.5*  --   PHOS 2.6  --  2.5  --  2.3*  --    < > = values in this interval not displayed.   Liver Function Tests Recent Labs    01/21/20 0410 01/21/20 1551 01/22/20 0319 01/22/20 1546 01/23/20 0415  AST 39  --  36  --   --   ALT 62*  --  45*  --   --   ALKPHOS 134*  --  122  --   --   BILITOT 1.3*  --  1.1  --   --   PROT 6.2*  --  5.5*  --   --   ALBUMIN 2.3*   < > 2.0* 1.9* 1.8*   < > = values in this interval not displayed.   No results for input(s): LIPASE, AMYLASE in the last 72 hours. Cardiac Enzymes No results for input(s): CKTOTAL, CKMB, CKMBINDEX, TROPONINI in the last 72 hours.  BNP: BNP (last 3 results) Recent Labs    01/11/2020 1619  BNP 577.5*    ProBNP (last 3 results) No results for input(s): PROBNP in the last 8760  hours.   D-Dimer No results for input(s): DDIMER in the last 72 hours. Hemoglobin A1C No results for input(s): HGBA1C in the last 72 hours. Fasting Lipid Panel No results for input(s): CHOL, HDL, LDLCALC, TRIG, CHOLHDL, LDLDIRECT in the last 72 hours. Thyroid Function Tests No results for input(s): TSH, T4TOTAL, T3FREE, THYROIDAB in the last 72 hours.  Invalid input(s): FREET3  Other results:   Imaging    No results found.   Medications:     Scheduled Medications: . sodium chloride   Intravenous Once  . aspirin  324 mg Per Tube Daily  . atorvastatin  80 mg Per Tube Daily  . B-complex with vitamin C  1 tablet Per Tube Daily  . busPIRone  10 mg Per Tube TID  . chlorhexidine gluconate (MEDLINE KIT)  15 mL Mouth Rinse BID  . Chlorhexidine Gluconate Cloth  6 each Topical Daily  . darbepoetin (ARANESP) injection - NON-DIALYSIS  60 mcg Subcutaneous Q Sat-1800  . feeding supplement (PIVOT 1.5 CAL)  1,000 mL Per Tube Q24H  . FLUoxetine  40 mg Per Tube BID  . mouth rinse  15 mL Mouth Rinse 10 times per day  . nystatin  5 mL Oral QID  . pantoprazole sodium  40 mg Per Tube Daily  . sodium chloride flush  10-40 mL Intracatheter Q12H  . sodium chloride flush  3 mL Intravenous Q12H  . valproic acid  250 mg Per Tube BID  . vancomycin variable dose per unstable renal function (pharmacist dosing)   Does not apply See admin instructions    Infusions: .  prismasol BGK 4/2.5 500 mL/hr at 01/23/20 0058  .  prismasol BGK 4/2.5 200 mL/hr at 01/22/20 1438  . sodium chloride Stopped (01/11/2020 1646)  . sodium chloride Stopped (01/23/20 1100)  . sodium chloride Stopped (01/16/20 1032)  . amiodarone 30   mg/hr (01/23/20 1200)  . DOPamine Stopped (01/21/20 1559)  . epinephrine 12 mcg/min (01/23/20 1022)  . fentaNYL infusion INTRAVENOUS 200 mcg/hr (01/23/20 1200)  . impella catheter heparin 50 unit/mL in dextrose 5% 9 mL/hr at 01/21/20 0015  . insulin 1.1 Units/hr (01/23/20 0729)  . lactated  ringers 20 mL/hr at 01/23/20 1200  . meropenem (MERREM) IV 1 g (01/23/20 1100)  . midazolam 2 mg/hr (01/23/20 1200)  . milrinone 0.25 mcg/kg/min (01/23/20 0434)  . norepinephrine (LEVOPHED) Adult infusion 44 mcg/min (01/23/20 1228)  . prismasol BGK 4/2.5 1,500 mL/hr at 01/22/20 2350  . vasopressin 0.04 Units/min (01/23/20 1200)    PRN Medications: sodium chloride, sodium chloride, bisacodyl **OR** bisacodyl, dextrose, docusate, fentaNYL, fentaNYL (SUBLIMAZE) injection, fentaNYL (SUBLIMAZE) injection, heparin, heparin, metoprolol tartrate, midazolam, midazolam, midazolam, ondansetron (ZOFRAN) IV, oxyCODONE, polyethylene glycol, sodium chloride flush, sodium chloride flush     Assessment/Plan   1. Cardiogenic shock/acute on chronic systolic CHF: Initially nonischemic cardiomyopathy. Possible familial cardiomyopathy as both parents had cardiomyopathy and died at around 60.However, [employer] gene testing did not show any common mutation for cardiomyopathy.  However, this admission noted to have severe 3 vessel disease so suspect component of ischemic cardiomyopathy. St Jude ICD. Echo in 8/20 with EF 15% and mildly decreased RV function. Echo this admission with EF < 20%, moderate LV dilation, RV mildly reduced, severe LAE, no significant MR.Low output HF with markedly low EF.  He has a long history of cardiomyopathy (20 yrs), tends to minimize symptoms.  Cardiorenal syndrome with creatinine up to 2.2,  stabilized on milrinone and Impella 5.5. SCr 1.44 day of CABG. CABG 12/15.  Post-op shock, back to OR 12/16 to open chest (chest wall compartment syndrome) and again to evacuate hematoma.  TEE post-op with severe RV dysfunction.  CVVHD initiated 12/19 for volume removal in setting of rising Scr/decreased UOP. Suspect ATN. S/p chest closure 12/23. Hemodynamics much worse on 12/24 with rising pressor demands. Co-ox 36% Bedside echo severe biventricular dysfunction with marked septal bounce. Chest  re-opened 12/24 - Remains on high-dose pressors (NE 44, Epi 12, VP 0.04, milrinone 0.25)  MAPS 70-80s - Co-ox improved to 63%  - Filling pressures low but weight still up 10-15 pounds. Continue CVVHD. Keeping even now but will pull -50 as toelrated - Continue Impella at current level (P-7) Waveforms ok  2. Atrial fibrillation: H/o PAF.  He was on dofetilide in the remote past but this was stopped due to noncompliance. He had an upper GI bleed from antral ulcers in 3/12. He was seen by GI and was deemed safe to restart anticoagulation as long as he remains on a PPI.  No apparent recurrence of AF until just prior to this admission, was cardioverted back to NSR in ER but went back into atrial fibrillation.  Post-op afib, DCCV to NSR/BiV pacing am 12/16 -> back to AF. Rapid atrial pacing back to SR on 12/26 - Continue IV amio - Off heparin post-op with bleeding, only getting heparin in Impella purge currently. Unable to restart AC currently with high risk of bleeding.  - Unable to do Maze with CABG.    3. CAD:   Cath this admission with severe 3 vessel disease. CABG x 4 on 12/15.  - Continue atorvastatin, ASA.  - continue current management 4. Acute on chronic hypoxemic respiratory failure: Vent per CCM.  - Remains on vent - CCM following - Suspect will need tach soon but complicated by open chest  5. AKI on CKD stage 3:   Likely ATN/cardiorenal - remains on CVVHD. Keeping even for now with hemodynamic instability. Pull -50/hr as will tolerate - Continue CVVHD per Renal. Appreciate their help.  6. Diabetes: Insulin.  7. Hyponatremia: Resolved.  8. Torsades/ICD shock: 12/7/212 in hospital event, in setting of severe hypokalemia and hypomagnesemia.   9. Gout: h/o gout. Complained of rt knee pain c/w previous flares - treated w/ prednisone burst (completed).  10: ID: Treating for PNA. - WBC remains elevated - On vancomycin/meropenem (PCN allergy).  - can add fungal coverage as needed  CRITICAL  CARE Performed by: ,   Total critical care time: 45 minutes  Critical care time was exclusive of separately billable procedures and treating other patients.  Critical care was necessary to treat or prevent imminent or life-threatening deterioration.  Critical care was time spent personally by me (independent of midlevel providers or residents) on the following activities: development of treatment plan with patient and/or surrogate as well as nursing, discussions with consultants, evaluation of patient's response to treatment, examination of patient, obtaining history from patient or surrogate, ordering and performing treatments and interventions, ordering and review of laboratory studies, ordering and review of radiographic studies, pulse oximetry and re-evaluation of patient's condition.     MD 01/23/2020 12:34 PM         

## 2020-01-23 NOTE — Progress Notes (Signed)
Pt 's wife [name] arrived at bedside and voiced concerns requesting to speak with MD.  Dr. Cyndia Bent paged and arrived to bedside to discuss concerns with family.

## 2020-01-23 NOTE — Progress Notes (Signed)
[place] for heparin Indication: Impella 5.5  Allergies  Allergen Reactions  . Orange Fruit Anaphylaxis, Hives and Other (See Comments)    Per Pt- Blisters around lips and Hives all over, also  . Penicillins Anaphylaxis    Did it involve swelling of the face/tongue/throat, SOB, or low BP? Yes Did it involve sudden or severe rash/hives, skin peeling, or any reaction on the inside of your mouth or nose? Yes Did you need to seek medical attention at a hospital or doctor's office? No When did it last happen?childhood If all above answers are "NO", may proceed with cephalosporin use.  [name] [Empagliflozin] Itching  . [name] [Insulin Glargine] Nausea And Vomiting    Patient Measurements: Height: 6\' 3"  (190.5 cm) Weight: 109 kg (240 lb 4.8 oz) IBW/kg (Calculated) : 84.5 Heparin dosing wt: 101 kg  Vital Signs: Temp: 99.86 F (37.7 C) (12/26 1530) Temp Source: Core (12/26 1400) Pulse Rate: 93 (12/26 1530)  Labs: Recent Labs    01/09/2020 1652 01/24/2020 1725 01/21/20 0410 01/21/20 0412 01/22/20 0319 01/22/20 0336 01/22/20 1546 01/22/20 1553 01/22/20 1604 01/22/20 2340 01/23/20 0415 01/23/20 0426  HGB 9.2*   < > 8.7*   < > 8.1*   < >  --    < > 7.8* 7.8* 7.3* 7.8*  HCT 28.7*   < > 29.0*   < > 26.4*   < >  --    < > 25.3* 23.0* 23.8* 23.0*  PLT 346  --  315   < > 305  --   --   --  313  --  296  --   LABPROT 15.5*  --   --   --   --   --   --   --   --   --   --   --   INR 1.3*  --   --   --   --   --   --   --   --   --   --   --   HEPARINUNFRC  --   --  <0.10*  --  <0.10*  --   --   --   --   --  <0.10*  --   CREATININE 2.20*  --  1.95*   < > 1.93*  --  1.87*  --   --   --  1.84*  --    < > = values in this interval not displayed.    Estimated Creatinine Clearance: 63.4 mL/min (A) (by C-G formula based on SCr of 1.84 mg/dL (H)).   Assessment: 49 yo male s/p impella 5.5 placement. Pt started on heparinized purge  and systemic heparin. Pt now s/p CABG 12/15 with Impella remaining in place c/b significant bleeding and hematoma requiring opened chest and reexploration in OR 12/16. Chest closed on 12/23, re-opened on 12/24.  Impella on P7 with stable purge pressures. Heparin running via purge only at this time, purge flow rate 9 ml/hr (approx 450 units/hr heparin). Heparin level undetectable as expected, Hgb dropped today s/p PRBC . LDH stable at 396. No s/sx of bleeding per nursing.   Goal of Therapy:  Heparin level 0.2-0.5 units/ml Monitor platelets by anticoagulation protocol: Yes   Plan:  Continue heparin via the purge only  Will follow with CVTS regarding systemic heparin once appropriate to initiate Daily heparin level for now   [employer].D. CPP, BCPS Clinical Pharmacist [phone] 01/23/2020 3:46 PM  Please check AMION for all [place] phone numbers After 10:00 PM, call [place] [phone]

## 2020-01-23 NOTE — Progress Notes (Signed)
3 Days Post-Op Procedure(s) (LRB): STERNAL CLOSURE (N/A) TRANSESOPHAGEAL ECHOCARDIOGRAM (TEE) (N/A) APPLICATION OF WOUND VAC (N/A) Subjective: Sedated on vent  PA 25/18 CVP 7 CI 3.3 Co-ox 63%  Milrinone 0.25 NE 44 Epi 10 Vaso 0.04 Impella at P8 NO 20  Objective: Vital signs in last 24 hours: Temp:  [95.9 F (35.5 C)-98.96 F (37.2 C)] 98.78 F (37.1 C) (12/26 0945) Pulse Rate:  [94-135] 123 (12/26 0945) Cardiac Rhythm: Atrial fibrillation (12/26 0800) Resp:  [25-28] 28 (12/26 0945) BP: (100-107)/(57-61) 100/57 (12/26 0337) SpO2:  [89 %-100 %] 98 % (12/26 0945) Arterial Line BP: (80-120)/(50-69) 88/54 (12/26 0945) FiO2 (%):  [40 %-50 %] 40 % (12/26 0800) Weight:  [109 kg] 109 kg (12/26 0500)  Hemodynamic parameters for last 24 hours: PAP: (24-38)/(15-26) 25/18 CVP:  [0 mmHg-14 mmHg] 7 mmHg CO:  [6.6 L/min-7.6 L/min] 7.6 L/min CI:  [2.8 L/min/m2-3.3 L/min/m2] 3.3 L/min/m2  Intake/Output from previous day: 12/25 0701 - 12/26 0700 In: 5532.6 [P.O.:55; I.V.:4497.8; NG/GT:100; IV Piggyback:643.6] Out: 3598 [Emesis/NG output:550; Stool:275; Chest Tube:160] Intake/Output this shift: Total I/O In: 403.6 [I.V.:385.4; Other:18.2] Out: 298 [Other:298]  General appearance: anasarca Neurologic: intact Heart: regular rate and rhythm, S1, S2 normal, no murmur Lungs: coarse Abdomen: soft, no bowel sounds  Extremities: warm, not mottled Wound: chest dressing intact  Lab Results: Recent Labs    01/22/20 1604 01/22/20 2340 01/23/20 0415 01/23/20 0426  WBC 31.9*  --  27.8*  --   HGB 7.8*   < > 7.3* 7.8*  HCT 25.3*   < > 23.8* 23.0*  PLT 313  --  296  --    < > = values in this interval not displayed.   BMET:  Recent Labs    01/22/20 1546 01/22/20 1553 01/23/20 0415 01/23/20 0426  NA 134*   < > 134* 136  K 3.9   < > 4.0 4.0  CL 101  --  103  --   CO2 21*  --  23  --   GLUCOSE 110*  --  111*  --   BUN 26*  --  21*  --   CREATININE 1.87*  --  1.84*  --    CALCIUM 8.0*  --  7.9*  --    < > = values in this interval not displayed.    PT/INR:  Recent Labs    01/05/2020 1652  LABPROT 15.5*  INR 1.3*   ABG    Component Value Date/Time   PHART 7.378 01/23/2020 0426   HCO3 24.3 01/23/2020 0426   TCO2 25 01/23/2020 0426   ACIDBASEDEF 1.0 01/23/2020 0426   O2SAT 97.0 01/23/2020 0426   CBG (last 3)  Recent Labs    01/23/20 0525 01/23/20 0628 01/23/20 0739  GLUCAP 107* 111* 110*    Assessment/Plan: S/P Procedure(s) (LRB): STERNAL CLOSURE (N/A) TRANSESOPHAGEAL ECHOCARDIOGRAM (TEE) (N/A) APPLICATION OF WOUND VAC (N/A)  Hemodynamically stable but remains on high dose vasopressor with marginal MAP 60. Rhythm this am looked atrial flutter 130-140. I rapid atrial paced to sinus 80's with improvement in BP. PA pressure and CVP are low. With Hgb 7.8 will transfuse 2 units PRBC's.   VDRF: per CCM, not weanable at this time.  AKI on CKD: continue CRRT. Keeping even to positive and was +1900 cc yesterday. Will try to get some volume off after transfusion.  Remains on broad spectrum antibiotics with stable leukocytosis at 27.8.  Has no BS and significant bilious OG drainage so holding off on tube feeds.  LOS: 24 days    Corey Palmer 01/23/2020

## 2020-01-23 NOTE — Progress Notes (Signed)
Patient ID: Corey Palmer, male   DOB: 04-12-1970, 49 y.o.   MRN: [id number]  S:  Ended up close to 2 liters positive running even on CRRT-  Machine running well   O:BP (!) 100/57   Pulse (!) 124   Temp 98.06 F (36.7 C)   Resp (!) 28   Ht _0  (1.905 m)   Wt 109 kg   SpO2 100%   BMI 30.04 kg/m   Intake/Output Summary (Last 24 hours) at 01/23/2020 0801 Last data filed at 01/23/2020 0700 Gross per 24 hour  Intake 5333.85 ml  Output 3196 ml  Net 2137.85 ml   Intake/Output: I/O last 3 completed shifts: In: 8067.3 [P.O.:55; I.V.:6731.8; Other:336.9; NG/GT:200; IV Piggyback:743.6] Out: 0932 [Emesis/NG output:800; TFTDD:2202; Stool:375; Chest Tube:260]  Intake/Output this shift:  No intake/output data recorded. Weight change:  Gen: intubated and sedated CVS: RRR, open sternal wound Resp: cta Abd: +BS, soft, NT/ND Ext: 2+ edema to gravity dependent areas  Recent Labs  Lab 01/17/20 0324 01/17/20 0326 01/18/20 0430 01/18/20 1512 01/19/20 0338 01/19/20 0444 01/19/20 1641 01/19/20 1652 01/04/2020 0308 01/01/2020 0310 01/21/2020 1652 01/07/2020 1725 01/21/20 0410 01/21/20 0412 01/21/20 1551 01/21/20 2007 01/22/20 0319 01/22/20 0336 01/22/20 1546 01/22/20 1553 01/22/20 2340 01/23/20 0415 01/23/20 0426  NA  --    < > 136   < > 135   < > 135   < > 135   < > 134*   < > 135   < > 134*   < > 135 137 134* 135 137 134* 136  K  --    < > 3.9   < > 4.0   < > 4.1   < > 4.2   < > 4.3   < > 4.5   < > 4.4   < > 3.9 3.9 3.9 3.9 3.9 4.0 4.0  CL  --    < > 104   < > 103  --  103  --  102  --  101  --  103  --  101  --  102  --  101  --   --  103  --   CO2  --    < > 24   < > 23  --  22  --  22  --  21*  --  21*  --  21*  --  23  --  21*  --   --  23  --   GLUCOSE  --    < > 158*   < > 127*  --  156*  --  139*  --  125*  --  143*  --  127*  --  112*  --  110*  --   --  111*  --   BUN  --    < > 28*   < > 29*  --  32*  --  30*  --  33*  --  31*  --  29*  --  26*  --  26*  --   --  21*  --    CREATININE  --    < > 1.75*   < > 1.87*  --  1.91*  --  1.81*  --  2.20*  --  1.95*  --  1.78*  --  1.93*  --  1.87*  --   --  1.84*  --   ALBUMIN 2.1*   < > 2.2*   < > 2.2*  --  2.1*  --  2.1*  --  2.1*  --  2.3*  --  2.3*  --  2.0*  --  1.9*  --   --  1.8*  --   CALCIUM  --    < > 8.2*   < > 8.1*  --  7.9*  --  8.1*  --  8.5*  --  8.5*  --  8.4*  --  8.2*  --  8.0*  --   --  7.9*  --   PHOS  --    < > 2.8   < > 2.8  --  2.4*  --  2.6  --  3.9  --   --   --  3.4  --  2.6  --  2.5  --   --  2.3*  --   AST 172*  --  86*  --  59*  --   --   --  46*  --  42*  --  39  --   --   --  36  --   --   --   --   --   --   ALT 262*  --  183*  --  131*  --   --   --  89*  --  76*  --  62*  --   --   --  45*  --   --   --   --   --   --    < > = values in this interval not displayed.   Liver Function Tests: Recent Labs  Lab 01/16/2020 1652 01/21/20 0410 01/21/20 1551 01/22/20 0319 01/22/20 1546 01/23/20 0415  AST 42* 39  --  36  --   --   ALT 76* 62*  --  45*  --   --   ALKPHOS 162* 134*  --  122  --   --   BILITOT 1.2 1.3*  --  1.1  --   --   PROT 6.0* 6.2*  --  5.5*  --   --   ALBUMIN 2.1* 2.3*   < > 2.0* 1.9* 1.8*   < > = values in this interval not displayed.   No results for input(s): LIPASE, AMYLASE in the last 168 hours. No results for input(s): AMMONIA in the last 168 hours. CBC: Recent Labs  Lab 01/21/20 0410 01/21/20 0412 01/21/20 1551 01/21/20 2007 01/22/20 0319 01/22/20 0336 01/22/20 1604 01/22/20 2340 01/23/20 0415 01/23/20 0426  WBC 28.4*  --  33.8*  --  31.3*  --  31.9*  --  27.8*  --   NEUTROABS 24.0*  --   --   --  27.1*  --   --   --  24.1*  --   HGB 8.7*   < > 8.9*   < > 8.1*   < > 7.8* 7.8* 7.3* 7.8*  HCT 29.0*   < > 28.4*   < > 26.4*   < > 25.3* 23.0* 23.8* 23.0*  MCV 95.4  --  93.4  --  94.6  --  94.4  --  93.3  --   PLT 315  --  352  --  305  --  313  --  296  --    < > = values in this interval not displayed.   Cardiac Enzymes: No results for input(s):  CKTOTAL, CKMB, CKMBINDEX, TROPONINI in the last 168 hours. CBG: Recent Labs  Lab 01/23/20 0318 01/23/20  0422 01/23/20 0525 01/23/20 0628 01/23/20 0739  GLUCAP 102* 104* 107* 111* 110*    Iron Studies: No results for input(s): IRON, TIBC, TRANSFERRIN, FERRITIN in the last 72 hours. Studies/Results: DG Chest 1 View  Result Date: 01/21/2020 CLINICAL DATA:  Persistent emesis.  History of open heart surgery EXAM: CHEST  1 VIEW COMPARISON:  Yesterday FINDINGS: Endotracheal tube with tip just below the clavicular heads. The enteric tube reaches the stomach. Chest tubes in place. Swan-Ganz catheter with tip at the right main pulmonary artery. Defibrillator lead into the right ventricle. Ventricular assist device in stable position. Unchanged hazy appearance of the bilateral chest, likely a combination of edema and atelectasis. No visible pneumothorax. Stable cardiomegaly. IMPRESSION: 1. Stable hardware positioning. 2. Stable edema and/or atelectasis symmetrically in the low volume lungs. Electronically Signed   By: Monte Fantasia M.D.   On: 01/21/2020 08:33   DG Abd 1 View  Result Date: 01/21/2020 CLINICAL DATA:  Ileus. EXAM: ABDOMEN - 1 VIEW COMPARISON:  01/14/2020 FINDINGS: 0804 hours. Gaseous distention of stomach and colon evident. No substantial small bowel dilatation. NG tube tip is just above the esophagogastric junction. IMPRESSION: 1. NG tube tip is in the distal esophagus just above the gastroesophageal junction. 2. Gaseous distention of stomach and colon. These results will be called to the ordering clinician or representative by the Radiologist Assistant, and communication documented in the PACS or [employer]. Electronically Signed   By: Misty Stanley M.D.   On: 01/21/2020 08:34   DG Chest Port 1 View  Result Date: 01/22/2020 CLINICAL DATA:  Recent open heart surgery. EXAM: PORTABLE CHEST 1 VIEW COMPARISON:  01/21/2020 FINDINGS: Left-sided pacemaker unchanged. Right IJ Swan-Ganz  catheter has tip over the origin of the right lower lobar pulmonary artery. Nasogastric tube coiled once over the stomach with tip over the gastric cardia. Endotracheal tube has tip 3.3 cm above the carina. Right-sided Impella device unchanged. Bilateral chest tubes unchanged. Mediastinal drain present. Lungs are hypoinflated with continued hazy prominence of the perihilar vasculature suggesting mild vascular congestion. Possible linear atelectasis over the left midlung/retrocardiac region. No pneumothorax. Suggestion of skin fold over the left apex. Stable cardiomegaly. Remainder the exam is unchanged. IMPRESSION: 1. Stable cardiomegaly with suggestion of mild vascular congestion unchanged. Suggestion of linear atelectasis over the left midlung/retrocardiac region. 2. Tubes and lines as described. No pneumothorax. Electronically Signed   By: Marin Olp M.D.   On: 01/22/2020 08:39   . sodium chloride   Intravenous Once  . aspirin  324 mg Per Tube Daily  . atorvastatin  80 mg Per Tube Daily  . B-complex with vitamin C  1 tablet Per Tube Daily  . busPIRone  10 mg Per Tube TID  . chlorhexidine gluconate (MEDLINE KIT)  15 mL Mouth Rinse BID  . Chlorhexidine Gluconate Cloth  6 each Topical Daily  . darbepoetin (ARANESP) injection - NON-DIALYSIS  60 mcg Subcutaneous Q Sat-1800  . feeding supplement (PIVOT 1.5 CAL)  1,000 mL Per Tube Q24H  . FLUoxetine  40 mg Per Tube BID  . mouth rinse  15 mL Mouth Rinse 10 times per day  . nystatin  5 mL Oral QID  . pantoprazole sodium  40 mg Per Tube Daily  . sodium chloride flush  10-40 mL Intracatheter Q12H  . sodium chloride flush  3 mL Intravenous Q12H  . valproic acid  250 mg Per Tube BID  . vancomycin variable dose per unstable renal function (pharmacist dosing)   Does not  apply See admin instructions    BMET    Component Value Date/Time   NA 136 01/23/2020 0426   NA 138 09/08/2018 0924   K 4.0 01/23/2020 0426   CL 103 01/23/2020 0415   CO2 23  01/23/2020 0415   GLUCOSE 111 (H) 01/23/2020 0415   BUN 21 (H) 01/23/2020 0415   BUN 23 09/08/2018 0924   CREATININE 1.84 (H) 01/23/2020 0415   CREATININE 1.13 05/13/2013 1533   CALCIUM 7.9 (L) 01/23/2020 0415   GFRNONAA 44 (L) 01/23/2020 0415   GFRNONAA 79 05/13/2013 1533   GFRAA >60 03/08/2019 1515   GFRAA >89 05/13/2013 1533   CBC    Component Value Date/Time   WBC 27.8 (H) 01/23/2020 0415   RBC 2.55 (L) 01/23/2020 0415   HGB 7.8 (L) 01/23/2020 0426   HGB 16.2 03/23/2018 1535   HCT 23.0 (L) 01/23/2020 0426   HCT 47.0 03/23/2018 1535   PLT 296 01/23/2020 0415   PLT 251 03/23/2018 1535   MCV 93.3 01/23/2020 0415   MCV 82 03/23/2018 1535   MCH 28.6 01/23/2020 0415   MCHC 30.7 01/23/2020 0415   RDW 22.6 (H) 01/23/2020 0415   RDW 15.0 03/23/2018 1535   LYMPHSABS 0.8 01/23/2020 0415   LYMPHSABS 2.5 03/23/2018 1535   MONOABS 2.0 (H) 01/23/2020 0415   EOSABS 0.3 01/23/2020 0415   EOSABS 0.2 03/23/2018 1535   BASOSABS 0.1 01/23/2020 0415   BASOSABS 0.0 03/23/2018 1535     Assessment/Plan:  1. AKI/CKD stage III- presumably due to ischemic ATN in setting of cardiogenic shock s/p CABG.  CRRT started 01/16/20. All fluids 4K/2.5Ca: Pre-filter 500 ml/hr, post filter 200 ml/hr, dialysate 1500 ml/hr. UF goal was high to achieve chest closure-  Now to back off to running even 2. No heparin due to hematoma  3. Right femoral HD catheter placed 01/16/20  1. CAD s/p CABG complicated by cardiogenic shock and post-op hematoma. secondary closure on 12/23 then had to be re opened 2. Cardiogenic shock- underlying familial cardiomyopathy s/p redo CABG. Remains on pressors: Levophed, epinephrine, and vasopressin. Impella and milrinone.  1. Tolerated UF with CRRT to achieve chest closure- now will back off to keeping even so pressors can be weaned - d/w CTS- CVP now 7-9-  Keeping even- Will defer any additional volume removal to cards and CTS  3. ABLA- s/p postoperative bleeding into  chest cavity s/p re-exploration for tamponade/hematoma. CT surgery following. stable- have added darbe 4. Hypophosphatemia- due to CRRT.  Repleted with K phos at some point-  Not critical- watch for now 5. Atrial fibrillation - s/p DCCV on 12/16 6. VDRF- per primary svc 7. ID - rising WBC. On vancomycin and meropenem.  WBC went down today    Louis Meckel  [employer] [phone]

## 2020-01-23 NOTE — Progress Notes (Signed)
NAME:  Corey Palmer, MRN:  [id number], DOB:  1970-10-31, LOS: 24 ADMISSION DATE:  12/30/2019, CONSULTATION DATE: 01/28/2020 REFERRING MD: Aundra Dubin, CHIEF COMPLAINT: Respiratory failure post Impella  HPI/course in hospital  49 year old man admitted with A. fib/RVR, torsades, cardiogenic shock [EF 15] requiring Impella, CABG x5, AKI requiring initiation of CRRT   Past Medical History:    has a past medical history of Anginal pain ([place]), Anxiety, Arthritis, Automatic implantable cardioverter-defibrillator in situ (2007), Bipolar disorder ([place]), CAD (coronary artery disease), Cancer ([place]) (2013), CHF (congestive heart failure) ([place]), Chondrocalcinosis of right knee (2/83/6629), Chronic systolic heart failure ([place]), Depression, Diabetes uncomplicated adult-type II, Dilated cardiomyopathy ([place]), Dyslipidemia, Dysrhythmia, GERD (gastroesophageal reflux disease), Gout, unspecified, Hepatic steatosis (2011), History of kidney stones, echocardiogram, Hypertension, Obesity (BMI 30.0-34.9), Pacemaker, Paroxysmal atrial fibrillation ([place]), Peptic ulcer, Shortness of breath, and Sleep apnea.  Significant Hospital Events:  12/2 Afib with RVR. Cardioverted in ED. AICD did not fire. Milrinone for low co ox.  12/7 ICD fired, Torsades- likely from low K of 2.8.  12/8 Cath- multivessel disease w/ low CO. Volume overloaded.  12/11 Underwent Impella 5.5 insertion for persistent symptoms of forward failure with low cardiac indices. 12/12 Back to the OR for displacement of Impella. Extubated.  12/15 CABG x4, with LIMA-LAD, SVG-PDA/PLV, SVG-OM 12/16 Emergently opened chest at bedside for tamponade, clot compressing right atrium  12/16 To OR for Exploration of chest due to bleeding  12/19 Started CRRT 12/23 Sternal Closure in OR 12/24 Worsening shock.  Chest reopened at bedside with improvement in hemodynamics.  No mediastinal hematoma. 12/25 A flutter > rapid a paced to sinus rhythm.  Remains on high-dose pressors,  inotropes  Consults:  PCCM, nephrology, cardiology, cardiothoracic surgery  Procedures:    Significant Diagnostic Tests:    Micro Data:  Blood culture 12/14 > no growth Blood culture 12/16 > no growth Respiratory culture 12/20 > no growth Sternal wound culture 12/23 >  Antimicrobials:  Vancomycin 12/16 >> Meropenem 12/16 >>  Objective   Blood pressure (!) 100/57, pulse 94, temperature 98.96 F (37.2 C), resp. rate (!) 28, height 6\' 3"  (1.905 m), weight 109 kg, SpO2 97 %. PAP: (24-38)/(14-26) 27/15 CVP:  [0 mmHg-14 mmHg] 6 mmHg CO:  [6.6 L/min-7.6 L/min] 7.6 L/min CI:  [2.8 L/min/m2-3.3 L/min/m2] 3.3 L/min/m2  Vent Mode: PRVC FiO2 (%):  [40 %-50 %] 40 % Set Rate:  [28 bmp] 28 bmp Vt Set:  [510 mL] 510 mL PEEP:  [5 cmH20] 5 cmH20 Plateau Pressure:  [22 cmH20-26 cmH20] 24 cmH20   Intake/Output Summary (Last 24 hours) at 01/23/2020 1115 Last data filed at 01/23/2020 1000 Gross per 24 hour  Intake 5167.12 ml  Output 2881 ml  Net 2286.12 ml   Filed Weights   01/19/2020 0815 01/21/20 0500 01/23/20 0500  Weight: 112.9 kg 109.7 kg 109 kg   PAP: (24-38)/(14-26) 27/15 CVP:  [0 mmHg-14 mmHg] 6 mmHg CO:  [6.6 L/min-7.6 L/min] 7.6 L/min CI:  [2.8 L/min/m2-3.3 L/min/m2] 3.3 L/min/m2  Examination: Blood pressure (!) 100/57, pulse 94, temperature 98.96 F (37.2 C), resp. rate (!) 28, height 6\' 3"  (1.905 m), weight 109 kg, SpO2 97 %. Gen:      No acute distress HEENT:  EOMI, sclera anicteric Neck:     No masses; no thyromegaly, ETT Lungs:    Clear to auscultation bilaterally; normal respiratory effort CV:         Regular rate and rhythm; no murmurs Abd:      + bowel sounds;  soft, non-tender; no palpable masses, no distension Ext:    No edema; adequate peripheral perfusion Skin:      Warm and dry; no rash Neuro: Sedated, unresponsive  Labs/imaging reviewed Labs are stable, no new imaging  Assessment & Plan:  Acute hypoxic respiratory failure Continue vent support.   Unable to wean due to hemodynamic instability Keep well sedated  Cardiogenic shock Acute on chronic systolic heart failure requiring mechanical cardiac support with Impala and titration of inotropes Severe coronary artery disease status post CABG, complicated by cardiac tamponade, post hematoma removal in OR x 2 -Continue pressors, inotropes as ordered, weaning as able -Continue nitric oxide 20 ppm -on impella  Afib:  - Amio gtt   Bipolar affective Diabetes type 2 with hyperglycemia - Insulin infusion  Paroxysmal atrial fibrillation requiring amiodarone infusion.  Acute kidney injury-improving cardiorenal syndrome  Acute blood loss anemia -Remains on CRRT -On empiric antibiotics -Following BMP, electrolytes -Following CBC, goal hemoglobin 8.0   Daily Goals Checklist  Pain/Anxiety/Delirium protocol (if indicated): versed and fentanyl infusions.  VAP protocol (if indicated): per protocol DVT prophylaxis: SCD, heparin via impella pruge GI prophylaxis: Protonix Glucose control: insulin infusion Code Status: Full code Family Communication: Per primary team Disposition: ICU  Critical care time:   The patient is critically ill with multiple organ system failure and requires high complexity decision making for assessment and support, frequent evaluation and titration of therapies, advanced monitoring, review of radiographic studies and interpretation of complex data.   Critical Care Time devoted to patient care services, exclusive of separately billable procedures, described in this note is 35 minutes.   Marshell Garfinkel MD [facility] Please see [email or web address] for pager details.  01/23/2020, 11:15 AM

## 2020-01-23 NOTE — Progress Notes (Signed)
RT note-NO tank changed out at this time. 1400 psi, NO remains at 20.

## 2020-01-23 NOTE — Progress Notes (Signed)
  Patient seen on evening CCU rounds.   Hemodynamically much improved with rapid atrial pacing back to NSR.   Weaning pressors. Pulsativity on a-line and Impella waveform much improved.  Exam unchanged.   Running CVVHD even currently.   Continue to wean pressors as tolerated.   Additional 30 mins CCT.   Glori Bickers, MD  8:34 PM

## 2020-01-23 NOTE — Progress Notes (Signed)
Patient ID: Corey Palmer, male   DOB: 1970-11-29, 49 y.o.   MRN: [id number] TCTS Evening Rounds:  Hemodynamics more stable after rapid pacing back to sinus. Able to wean NE to 22 mcg from 44.  Remains on CRRT with even to slightly negative balance. BMET    Component Value Date/Time   NA 132 (L) 01/23/2020 1754   NA 138 09/08/2018 0924   K 3.8 01/23/2020 1754   CL 103 01/23/2020 1754   CO2 21 (L) 01/23/2020 1754   GLUCOSE 110 (H) 01/23/2020 1754   BUN 19 01/23/2020 1754   BUN 23 09/08/2018 0924   CREATININE 1.90 (H) 01/23/2020 1754   CREATININE 1.13 05/13/2013 1533   CALCIUM 7.7 (L) 01/23/2020 1754   GFRNONAA 42 (L) 01/23/2020 1754   GFRNONAA 79 05/13/2013 1533   GFRAA >60 03/08/2019 1515   GFRAA >89 05/13/2013 1533   Transfused 2 units today. Repeat CBC in am.

## 2020-01-24 ENCOUNTER — Inpatient Hospital Stay (HOSPITAL_COMMUNITY): Payer: BC Managed Care – PPO

## 2020-01-24 ENCOUNTER — Encounter (HOSPITAL_COMMUNITY): Payer: Self-pay | Admitting: Cardiothoracic Surgery

## 2020-01-24 DIAGNOSIS — R570 Cardiogenic shock: Secondary | ICD-10-CM | POA: Diagnosis not present

## 2020-01-24 LAB — GLUCOSE, CAPILLARY
Glucose-Capillary: 104 mg/dL — ABNORMAL HIGH (ref 70–99)
Glucose-Capillary: 107 mg/dL — ABNORMAL HIGH (ref 70–99)
Glucose-Capillary: 112 mg/dL — ABNORMAL HIGH (ref 70–99)
Glucose-Capillary: 116 mg/dL — ABNORMAL HIGH (ref 70–99)
Glucose-Capillary: 118 mg/dL — ABNORMAL HIGH (ref 70–99)
Glucose-Capillary: 118 mg/dL — ABNORMAL HIGH (ref 70–99)
Glucose-Capillary: 121 mg/dL — ABNORMAL HIGH (ref 70–99)
Glucose-Capillary: 124 mg/dL — ABNORMAL HIGH (ref 70–99)
Glucose-Capillary: 124 mg/dL — ABNORMAL HIGH (ref 70–99)
Glucose-Capillary: 132 mg/dL — ABNORMAL HIGH (ref 70–99)
Glucose-Capillary: 151 mg/dL — ABNORMAL HIGH (ref 70–99)
Glucose-Capillary: 156 mg/dL — ABNORMAL HIGH (ref 70–99)
Glucose-Capillary: 160 mg/dL — ABNORMAL HIGH (ref 70–99)
Glucose-Capillary: 177 mg/dL — ABNORMAL HIGH (ref 70–99)

## 2020-01-24 LAB — BPAM RBC
Blood Product Expiration Date: 202201092359
Blood Product Expiration Date: 202201132359
Blood Product Expiration Date: 202201162359
Blood Product Expiration Date: 202201162359
Blood Product Expiration Date: 202201212359
Blood Product Expiration Date: 202201212359
ISSUE DATE / TIME: 202112231104
ISSUE DATE / TIME: 202112231104
ISSUE DATE / TIME: 202112261018
ISSUE DATE / TIME: 202112261018
ISSUE DATE / TIME: 202112270159
ISSUE DATE / TIME: 202112270826
Unit Type and Rh: 6200
Unit Type and Rh: 6200
Unit Type and Rh: 6200
Unit Type and Rh: 6200
Unit Type and Rh: 6200
Unit Type and Rh: 6200

## 2020-01-24 LAB — TYPE AND SCREEN
ABO/RH(D): A POS
Antibody Screen: NEGATIVE
Unit division: 0
Unit division: 0
Unit division: 0
Unit division: 0
Unit division: 0
Unit division: 0

## 2020-01-24 LAB — COOXEMETRY PANEL
Carboxyhemoglobin: 1.5 % (ref 0.5–1.5)
Methemoglobin: 1 % (ref 0.0–1.5)
O2 Saturation: 70.3 %
Total hemoglobin: 8.7 g/dL — ABNORMAL LOW (ref 12.0–16.0)

## 2020-01-24 LAB — RENAL FUNCTION PANEL
Albumin: 1.8 g/dL — ABNORMAL LOW (ref 3.5–5.0)
Albumin: 1.8 g/dL — ABNORMAL LOW (ref 3.5–5.0)
Anion gap: 9 (ref 5–15)
Anion gap: 7 (ref 5–15)
BUN: 18 mg/dL (ref 6–20)
BUN: 17 mg/dL (ref 6–20)
CO2: 23 mmol/L (ref 22–32)
CO2: 24 mmol/L (ref 22–32)
Calcium: 8.1 mg/dL — ABNORMAL LOW (ref 8.9–10.3)
Calcium: 7.9 mg/dL — ABNORMAL LOW (ref 8.9–10.3)
Chloride: 103 mmol/L (ref 98–111)
Chloride: 104 mmol/L (ref 98–111)
Creatinine, Ser: 1.88 mg/dL — ABNORMAL HIGH (ref 0.61–1.24)
Creatinine, Ser: 1.89 mg/dL — ABNORMAL HIGH (ref 0.61–1.24)
GFR, Estimated: 43 mL/min — ABNORMAL LOW (ref >60)
GFR, Estimated: 42 mL/min — ABNORMAL LOW (ref >60)
Glucose, Bld: 119 mg/dL — ABNORMAL HIGH (ref 70–99)
Glucose, Bld: 124 mg/dL — ABNORMAL HIGH (ref 70–99)
Phosphorus: 2.2 mg/dL — ABNORMAL LOW (ref 2.5–4.6)
Phosphorus: 2.9 mg/dL (ref 2.5–4.6)
Potassium: 3.9 mmol/L (ref 3.5–5.1)
Potassium: 3.6 mmol/L (ref 3.5–5.1)
Sodium: 135 mmol/L (ref 135–145)
Sodium: 135 mmol/L (ref 135–145)

## 2020-01-24 LAB — CBC
HCT: 27.7 % — ABNORMAL LOW (ref 39.0–52.0)
Hemoglobin: 8.6 g/dL — ABNORMAL LOW (ref 13.0–17.0)
MCH: 28.6 pg (ref 26.0–34.0)
MCHC: 31 g/dL (ref 30.0–36.0)
MCV: 92 fL (ref 80.0–100.0)
Platelets: 273 10*3/uL (ref 150–400)
RBC: 3.01 MIL/uL — ABNORMAL LOW (ref 4.22–5.81)
RDW: 21.2 % — ABNORMAL HIGH (ref 11.5–15.5)
WBC: 22.2 10*3/uL — ABNORMAL HIGH (ref 4.0–10.5)
nRBC: 0.6 % — ABNORMAL HIGH (ref 0.0–0.2)

## 2020-01-24 LAB — CBC WITH DIFFERENTIAL/PLATELET
Abs Immature Granulocytes: 0.25 10*3/uL — ABNORMAL HIGH (ref 0.00–0.07)
Basophils Absolute: 0.1 10*3/uL (ref 0.0–0.1)
Basophils Relative: 0 %
Eosinophils Absolute: 0.2 10*3/uL (ref 0.0–0.5)
Eosinophils Relative: 1 %
HCT: 27.4 % — ABNORMAL LOW (ref 39.0–52.0)
Hemoglobin: 8.4 g/dL — ABNORMAL LOW (ref 13.0–17.0)
Immature Granulocytes: 1 %
Lymphocytes Relative: 4 %
Lymphs Abs: 0.8 10*3/uL (ref 0.7–4.0)
MCH: 28.4 pg (ref 26.0–34.0)
MCHC: 30.7 g/dL (ref 30.0–36.0)
MCV: 92.6 fL (ref 80.0–100.0)
Monocytes Absolute: 1.3 10*3/uL — ABNORMAL HIGH (ref 0.1–1.0)
Monocytes Relative: 6 %
Neutro Abs: 19.3 10*3/uL — ABNORMAL HIGH (ref 1.7–7.7)
Neutrophils Relative %: 88 %
Platelets: 247 10*3/uL (ref 150–400)
RBC: 2.96 MIL/uL — ABNORMAL LOW (ref 4.22–5.81)
RDW: 21.3 % — ABNORMAL HIGH (ref 11.5–15.5)
WBC: 22 10*3/uL — ABNORMAL HIGH (ref 4.0–10.5)
nRBC: 0.7 % — ABNORMAL HIGH (ref 0.0–0.2)

## 2020-01-24 LAB — POCT I-STAT 7, (LYTES, BLD GAS, ICA,H+H)
Acid-Base Excess: 0 mmol/L (ref 0.0–2.0)
Bicarbonate: 25.1 mmol/L (ref 20.0–28.0)
Calcium, Ion: 1.19 mmol/L (ref 1.15–1.40)
HCT: 25 % — ABNORMAL LOW (ref 39.0–52.0)
Hemoglobin: 8.5 g/dL — ABNORMAL LOW (ref 13.0–17.0)
O2 Saturation: 98 %
Patient temperature: 37
Potassium: 4.1 mmol/L (ref 3.5–5.1)
Sodium: 138 mmol/L (ref 135–145)
TCO2: 26 mmol/L (ref 22–32)
pCO2 arterial: 40.9 mmHg (ref 32.0–48.0)
pH, Arterial: 7.395 (ref 7.350–7.450)
pO2, Arterial: 99 mmHg (ref 83.0–108.0)

## 2020-01-24 LAB — VANCOMYCIN, TROUGH: Vancomycin Tr: 21 ug/mL (ref 15–20)

## 2020-01-24 LAB — MAGNESIUM
Magnesium: 2.5 mg/dL — ABNORMAL HIGH (ref 1.7–2.4)
Magnesium: 2.5 mg/dL — ABNORMAL HIGH (ref 1.7–2.4)

## 2020-01-24 LAB — LACTATE DEHYDROGENASE: LDH: 357 U/L — ABNORMAL HIGH (ref 98–192)

## 2020-01-24 LAB — HEPARIN LEVEL (UNFRACTIONATED): Heparin Unfractionated: <0.1 IU/mL — ABNORMAL LOW (ref 0.30–0.70)

## 2020-01-24 MED ORDER — PROSOURCE TF PO LIQD
45.0000 mL | Freq: Two times a day (BID) | ORAL | Status: DC
  Administered 2020-01-24: 10:00:00 45 mL

## 2020-01-24 MED ORDER — PROSOURCE TF PO LIQD
45.0000 mL | Freq: Four times a day (QID) | ORAL | Status: DC
  Administered 2020-01-24 – 2020-01-27 (×13): 45 mL
  Filled 2020-01-24 (×13): qty 45

## 2020-01-24 MED ORDER — VANCOMYCIN HCL IN DEXTROSE 1-5 GM/200ML-% IV SOLN
1000.0000 mg | INTRAVENOUS | Status: DC
  Administered 2020-01-24 – 2020-01-27 (×4): 1000 mg via INTRAVENOUS
  Filled 2020-01-24 (×4): qty 200

## 2020-01-24 MED ORDER — POTASSIUM PHOSPHATES 15 MMOLE/5ML IV SOLN
20.0000 mmol | Freq: Once | INTRAVENOUS | Status: AC
  Administered 2020-01-24: 09:00:00 20 mmol via INTRAVENOUS
  Filled 2020-01-24: qty 6.67

## 2020-01-24 MED ORDER — INSULIN ASPART 100 UNIT/ML ~~LOC~~ SOLN: 3.0000 [IU] | SUBCUTANEOUS | Status: DC

## 2020-01-24 MED ORDER — PIVOT 1.5 CAL PO LIQD
1000.0000 mL | ORAL | Status: DC
  Administered 2020-01-24 – 2020-01-26 (×2): 1000 mL

## 2020-01-24 MED ORDER — VITAL HIGH PROTEIN PO LIQD: 1000.0000 mL | ORAL | Status: DC

## 2020-01-24 MED ORDER — AMIODARONE LOAD VIA INFUSION
150.0000 mg | Freq: Once | INTRAVENOUS | Status: AC
  Administered 2020-01-24: 10:00:00 150 mg via INTRAVENOUS

## 2020-01-24 MED ORDER — RENA-VITE PO TABS: 1.0000 | ORAL_TABLET | Freq: Every day | ORAL | Status: DC

## 2020-01-24 NOTE — Progress Notes (Signed)
NAME:  Corey Palmer, MRN:  [id number], DOB:  [birth year], LOS: 52 ADMISSION DATE:  01/25/2020, CONSULTATION DATE: 01/10/2020 REFERRING MD: Aundra Dubin, CHIEF COMPLAINT: Respiratory failure post Impella  HPI/course in hospital  49 year old man admitted with A. fib/RVR, torsades, cardiogenic shock [EF 15] requiring Impella, CABG x5, AKI requiring initiation of CRRT   Past Medical History:    has a past medical history of Anginal pain ([place]), Anxiety, Arthritis, Automatic implantable cardioverter-defibrillator in situ (2007), Bipolar disorder ([place]), CAD (coronary artery disease), Cancer ([place]) (2013), CHF (congestive heart failure) ([place]), Chondrocalcinosis of right knee (4/66/5993), Chronic systolic heart failure ([place]), Depression, Diabetes uncomplicated adult-type II, Dilated cardiomyopathy ([place]), Dyslipidemia, Dysrhythmia, GERD (gastroesophageal reflux disease), Gout, unspecified, Hepatic steatosis (2011), History of kidney stones, echocardiogram, Hypertension, Obesity (BMI 30.0-34.9), Pacemaker, Paroxysmal atrial fibrillation ([place]), Peptic ulcer, Shortness of breath, and Sleep apnea.  Significant Hospital Events:  12/2 Afib with RVR. Cardioverted in ED. AICD did not fire. Milrinone for low co ox.  12/7 ICD fired, Torsades- likely from low K of 2.8.  12/8 Cath- multivessel disease w/ low CO. Volume overloaded.  12/11 Underwent Impella 5.5 insertion for persistent symptoms of forward failure with low cardiac indices. 12/12 Back to the OR for displacement of Impella. Extubated.  12/15 CABG x4, with LIMA-LAD, SVG-PDA/PLV, SVG-OM 12/16 Emergently opened chest at bedside for tamponade, clot compressing right atrium  12/16 To OR for Exploration of chest due to bleeding  12/19 Started CRRT 12/23 Sternal Closure in OR 12/24 Worsening shock.  Chest reopened at bedside with improvement in hemodynamics.  No mediastinal hematoma. 12/25 A flutter > rapid a paced to sinus rhythm.  Remains on high-dose pressors,  inotropes  Consults:  PCCM, nephrology, cardiology, cardiothoracic surgery  Procedures:    Significant Diagnostic Tests:    Micro Data:  Blood culture 12/14 > no growth Blood culture 12/16 > no growth Respiratory culture 12/20 > no growth Sternal wound culture 12/23 >  Antimicrobials:  Vancomycin 12/16 >> Meropenem 12/16 >>  Interval history:    Improving pulsatility and weaning vasopressor requirements. Chest remains open.   Objective   Blood pressure (!) 103/56, pulse (!) 104, temperature (!) 96.26 F (35.7 C), temperature source Core, resp. rate (!) 28, height 6\' 3"  (1.905 m), weight 109.6 kg, SpO2 100 %. PAP: (24-35)/(11-23) 28/18 CVP:  [2 mmHg-19 mmHg] 8 mmHg PCWP:  [16 mmHg-24 mmHg] 16 mmHg CO:  [8.6 L/min-9.5 L/min] 9.5 L/min CI:  [3.7 L/min/m2-4.1 L/min/m2] 4.1 L/min/m2  Vent Mode: PRVC FiO2 (%):  [40 %] 40 % Set Rate:  [28 bmp] 28 bmp Vt Set:  [510 mL-520 mL] 520 mL PEEP:  [5 cmH20] 5 cmH20 Plateau Pressure:  [22 cmH20-28 cmH20] 22 cmH20   Intake/Output Summary (Last 24 hours) at 01/24/2020 1230 Last data filed at 01/24/2020 1200 Gross per 24 hour  Intake 4202.4 ml  Output 4414 ml  Net -211.6 ml   Filed Weights   01/21/20 0500 01/23/20 0500 01/24/20 0615  Weight: 109.7 kg 109 kg 109.6 kg   PAP: (24-35)/(11-23) 28/18 CVP:  [2 mmHg-19 mmHg] 8 mmHg PCWP:  [16 mmHg-24 mmHg] 16 mmHg CO:  [8.6 L/min-9.5 L/min] 9.5 L/min CI:  [3.7 L/min/m2-4.1 L/min/m2] 4.1 L/min/m2  Examination: Gen:      Intubated, sedated.  HEENT:  EOMI, sclera anicteric Neck:     ETT and OGT in place.  Lungs:    Clear to auscultation bilaterally; normal respiratory effort CV:         Regular rate and rhythm; no  murmurs Chest:  Open chest, minimal chest tube drainage.  Abd:      + bowel sounds; soft, non-tender; no palpable masses, no distension Ext:    No edema; adequate peripheral perfusion Skin:      Warm and dry; no rash, line sites are intact. Neuro:  Sedated,  unresponsive   Assessment & Plan:  Critically ill due to Acute hypoxic respiratory failure -Continue vent support.  Unable to wean due to hemodynamic instability -Keep well sedated -Tracheostomy concurrently with chest closure  Critically ill due to Cardiogenic shock due to acute on chronic systolic heart failure requiring mechanical cardiac support with Impala and titration of inotropes Severe coronary artery disease status post CABG, complicated by cardiac tamponade, post hematoma removal in OR x 2 -Wean vasopressors as tolerated to keep MAP >65 -Continue epinephrine, milrinone and nitric oxide for RV support at current levels until chest closed -Continue Impella at P7  Critically ill due to Afib with hemodynamic instability - Continue Amio IV - Continue AV pacing at rate 100.     Bipolar affective Diabetes type 2 with hyperglycemia - Insulin infusion  Acute kidney injury-improving cardiorenal syndrome  Acute blood loss anemia -Remains on CRRT -On empiric antibiotics -Following BMP, electrolytes -Following CBC, goal hemoglobin 8.0   Daily Goals Checklist  Pain/Anxiety/Delirium protocol (if indicated): fentanyl and versed infusion to RASS-3, lighten once chest closed.  VAP protocol (if indicated): bundle in place. Respiratory support goals: full ventilatory support. Wean once chest closed.  Blood pressure target: MAP>65 DVT prophylaxis:  Heparin via CRRT circuit. Nutrition Status: high nutritional risk. - resume tube feeds, Cortrak. GI prophylaxis: pantoprazole Fluid status goals: CRRT to net UF -50 to -172ml/h - achieve net negative fluid balance to facilitate chest closure later this week. Remains edematous despite improved CVP Urinary catheter: not required due to anuria. periodic bladder scan. Central lines: PICC, PAC, HD catheter. Glucose control: on insulin infusion, transition once chest closed.  Mobility/therapy needs: bed rest  Antibiotic de-escalation: continue  current antibiotics until chest closed. Home medication reconciliation: home antidepressants re-ordered. Daily labs: CBC, renal function, cooximetry Code Status: full  Family Communication: wife updated at the bedside Disposition: ICU   Critical care time:   CRITICAL CARE Performed by: Kipp Brood   Total critical care time: 40 minutes  Critical care time was exclusive of separately billable procedures and treating other patients.  Critical care was necessary to treat or prevent imminent or life-threatening deterioration.  Critical care was time spent personally by me on the following activities: development of treatment plan with patient and/or surrogate as well as nursing, discussions with consultants, evaluation of patient's response to treatment, examination of patient, obtaining history from patient or surrogate, ordering and performing treatments and interventions, ordering and review of laboratory studies, ordering and review of radiographic studies, pulse oximetry, re-evaluation of patient's condition and participation in multidisciplinary rounds.  Kipp Brood, MD [facility] ICU Physician [place]  Pager: [phone] Mobile: [phone] After hours: [phone].   01/24/2020, 12:30 PM

## 2020-01-24 NOTE — Progress Notes (Signed)
Pt in Afib 110s-120.  [facility] notified and amio bolus given. Amio gtt increased to 60mg /hr and pacer changed to a backup of 60 VVI per order.  HR now afib 102. Will continue to monitor.

## 2020-01-24 NOTE — Op Note (Signed)
Corey Palmer, MELESKI MEDICAL RECORD [id number] ACCOUNT [id number] DATE OF BIRTH:[birth year] FACILITY: [facility] LOCATION: [facility] PHYSICIAN:Zurich Carreno VAN TRIGT III, MD  OPERATIVE REPORT  DATE OF PROCEDURE:  01/07/2020  OPERATIONS: 1.  Sternal closure. 2.  Placement of central line.  SURGEON:  Ivin Poot, MD  PREOPERATIVE DIAGNOSIS:  Open chest after redo CABG 1 week ago.  POSTOPERATIVE DIAGNOSIS:  Open chest after redo CABG 1 week ago.  ANESTHESIA:  General.  DESCRIPTION OF PROCEDURE:  After informed consent was obtained from the patient's family and the patient's condition was discussed with his cardiologist, Dr. Glori Bickers, for coordination of care, the patient was brought to the operating room for  delayed sternal closure.  The patient remained stable in transport.  He was placed supine on the operating table, and general anesthesia was induced.  He was already intubated with central line PA catheter, Impella 5.5 and inhaled nitric oxide.  A  transesophageal echo probe was placed by the anesthesia team.  The patient was prepped and draped as a sterile field.  A proper timeout was performed.  The previously placed Esmarch dressing was removed from the sternum.  The mediastinum was examined.  There was minimal amount of clotted and non-clotted blood.  This was removed.  The wound was cultured.  The wound was irrigated with warm vancomycin  irrigation.  There was no active bleeding.  The mammary artery graft to the right appeared to be patent.  Two new bilateral pleural tubes were placed as well as an anterior mediastinal tube and brought out through separate incisions.  Double sternal  wires were then placed, and the sternum was slowly closed.  The patient maintained stable hemodynamics and adequate to very satisfactory flow in the Impella 5.5 catheter.  The wires were twisted and secured.  The sternal wound was closed in layers using  running Vicryl for the muscle layer, running  Vicryl for the subcutaneous layer, and a subcuticular layer for the skin.  A Prevena VAC was placed over the skin incision.  A new sterile left subclavian central line was placed for IV access, flushed, and  covered with sterile dressing.  The patient was then transported back to the ICU in stable condition.  IN/NUANCE  D:01/13/2020 T:12/29/2019 JOB:[id number]

## 2020-01-24 NOTE — Progress Notes (Addendum)
Nutrition Follow-up  DOCUMENTATION CODES:   Not applicable  INTERVENTION:   Recommend addition of prokinetic agent given emesis and large OG output  Tube Feeding:  -Pivot 1.5 at 20 ml/hr via Cortrak -Increase by 10 ml Q12 hours to goal rate of 65 ml/hr (1560 ml) -Pro-Source TF 45 ml QID  Provides 190 g of protein, 2500 kcals and 1170 mL of free water  Continue B-complex with C to replace losses from CRRT  NUTRITION DIAGNOSIS:   Inadequate oral intake related to acute illness as evidenced by NPO status.  Ongoing  GOAL:   Patient will meet greater than or equal to 90% of their needs  Addressed via TF  MONITOR:   Weight trends,Vent status  REASON FOR ASSESSMENT:   Ventilator    ASSESSMENT:   49 yo male admitted with Afib with RVR requiring cardioversion, ischemic CM requiring inotropes and impella support and ultimately underwent CABG x 4 on 12/15 with Impella still in place. PMH includes CAD, benign stomach cancer, CHF, DM, depression, CM, GERD, HTN  12/11 Impella LVAD placed 12/15 Redo Sternotomy, CABG on pump x 4, Intubated 12/16 Bedside chest exploration for tamponade with moderate amount of anterior mediastinal clot removed, OR for bleeding s/p CABG for mediastinal washout 12/17 Cortrak placed-duodenal  12/19 CRRT initiated 12/23 Sternal closure  12/24 Worsening shock, Chest reopened at bedside  Pt discussed during ICU rounds and with RN.   Pressors slowly being weaned, but remains on multiple. Chest remains open. Plan for tracheostomy upon chest closure. Continues on CRRT, pulling 50 ml/hr. Original Cortrak pulled during closure on 12/23 and an OGT was placed. Patient had large volume OG output and TF was not restarted. Cortrak placed today at bedside, xray read tip in duodenal bulb. Recommend addition of prokinetic agent to promote motility. Titrate Pivot 1.5 slowly to goal.   Admission weight: 119.4 kg  Current weight: 109.6 kg   Patient remains  intubated on ventilator support MV: 13.9 L/min Temp (24hrs), Avg:98.7 F (37.1 C), Min:96.26 F (35.7 C), Max:100.04 F (37.8 C)  UOP: anuric CRRT: 3711 ml x 24 hrs Chest tubes: 240 ml x 24 hrs   Drips: epinephrine, insulin, levophed, potassium phosphate, vasopressin Medications: aranesp Labs: Phosphorus 2.2 (L) Mg 2.5 (H) CBG 104-131  Diet Order:   Diet Order            Diet NPO time specified Except for: Sips with Meds  Diet effective midnight                 EDUCATION NEEDS:   Not appropriate for education at this time  Skin:  Skin Assessment: Skin Integrity Issues: Skin Integrity Issues:: Incisions,Other (Comment) DTI: upper face Incisions: L leg, stermun, open chest Other: venous stasis ulcers  Last BM:  12/27 via rectal tube  Height:   Ht Readings from Last 1 Encounters:  01/01/2020 6\' 3"  (1.905 m)    Weight:   Wt Readings from Last 1 Encounters:  01/24/20 109.6 kg    Ideal Body Weight:  89 kg  BMI:  Body mass index is 30.2 kg/m.  Estimated Nutritional Needs:   Kcal:  2300-2500 kcal  Protein:  180-200 grams  Fluid:  >/= 1.5 L   [name] RD, LDN Clinical Nutrition Pager listed in [place]

## 2020-01-24 NOTE — Procedures (Signed)
Cortrak  Person Inserting Tube:  Esaw Dace, RD Tube Type:  Cortrak - 43 inches Tube Location:  Right nare Secured by: Bridle Technique Used to Measure Tube Placement:  Documented cm marking at nare/ corner of mouth Cortrak Secured At:  83 cm    Cortrak Tube Team Note:  Consult received to place a Cortrak feeding tube.   X-ray is required, abdominal x-ray has been ordered by the Cortrak team. Please confirm tube placement before using the Cortrak tube.   If the tube becomes dislodged please keep the tube and contact the Cortrak team at [email or web address] (password [email or web address]) for replacement.  If after hours and replacement cannot be delayed, place a NG tube and confirm placement with an abdominal x-ray.   Kerman Passey MS, RDN, LDN, CNSC Registered Dietitian III Clinical Nutrition RD Pager and On-Call Pager Number Located in [place]

## 2020-01-24 NOTE — Progress Notes (Addendum)
Patient ID: Corey Palmer, male   DOB: [birth year], 49 y.o.   MRN: [id number]     Advanced Heart Failure Rounding Note  PCP-Cardiologist: No primary care provider on file.   Subjective:    - 12/7 Torsades -ICD shock x1.  - 12/11 Impella 5.5 placed.  - 12/12 Impella repositioned in OR - 12/15 CABG x 4 with LIMA-LAD, SVG-PDA/PLV, SVG-OM - 12/16 DCCV afib.  Back to OR to re-open chest and again to evacuate hematoma.  - 12/19 CVVH initiated  - 12/23 Chest closed - 12/24 Chest reopened - 12/26 RAP back into NSR  Chest re-opened 12/24.   On NE 20, Epi 8, VP 0.04, milrinone 0.25, iNO 21 and Impella. Co-ox 70%   Keeping even on CVVHD. CVP 5. Weight stable.   [place] numbers: CVP 6 PA 29/15 CI 4.1 Co-ox 70%  Impella 5.5 P-7 Flow 3.9  Heparin only in purge Waveforms ok. No alarms LDH 357  Echo (12/10): EF <20%, moderate LV dilation, normal RV size with mildly decreased systolic function.    Objective:   Weight Range: 109.6 kg Body mass index is 30.2 kg/m.   Vital Signs:   Temp:  [97.88 F (36.6 C)-100.04 F (37.8 C)] 98.6 F (37 C) (12/27 0700) Pulse Rate:  [86-130] 89 (12/27 0700) Resp:  [25-28] 28 (12/27 0700) BP: (93-126)/(56-67) 93/56 (12/27 0350) SpO2:  [88 %-100 %] 100 % (12/27 0700) Arterial Line BP: (84-130)/(37-67) 94/58 (12/27 0700) FiO2 (%):  [40 %] 40 % (12/27 0350) Weight:  [109.6 kg] 109.6 kg (12/27 0615) Last BM Date: 01/23/20  Weight change: Filed Weights   01/21/20 0500 01/23/20 0500 01/24/20 0615  Weight: 109.7 kg 109 kg 109.6 kg    Intake/Output:   Intake/Output Summary (Last 24 hours) at 01/24/2020 0709 Last data filed at 01/24/2020 0700 Gross per 24 hour  Intake 4712.92 ml  Output 4221 ml  Net 491.92 ml      Physical Exam  General:  Intubated.  HEENT: ETT Neck: supple. no JVD. Carotids 2+ bilat; no bruits. No lymphadenopathy or thryomegaly appreciated. Cor: R axillary impella. Chest open with VAC in place. PMI nondisplaced.  Regular rate & rhythm. No rubs, gallops or murmurs. Lungs Coarse throughoutt  Abdomen: soft, nontender, nondistended. No hepatosplenomegaly. No bruits or masses. Good bowel sounds. Extremities: no cyanosis, clubbing, rash, edema Neuro: sedated  Telemetry  SR 80s   Labs    CBC Recent Labs    01/23/20 0415 01/23/20 0426 01/24/20 0245 01/24/20 0647  WBC 27.8*  --  22.0*  --   NEUTROABS 24.1*  --  19.3*  --   HGB 7.3*   < > 8.4* 8.5*  HCT 23.8*   < > 27.4* 25.0*  MCV 93.3  --  92.6  --   PLT 296  --  247  --    < > = values in this interval not displayed.   Basic Metabolic Panel Recent Labs    01/23/20 0415 01/23/20 0426 01/23/20 1754 01/24/20 0245 01/24/20 0647  NA 134*   < > 132* 135 138  K 4.0   < > 3.8 3.9 4.1  CL 103  --  103 103  --   CO2 23  --  21* 23  --   GLUCOSE 111*  --  110* 119*  --   BUN 21*  --  19 18  --   CREATININE 1.84*  --  1.90* 1.88*  --   CALCIUM 7.9*  --  7.7* 8.1*  --  MG 2.5*  --   --  2.5*  --   PHOS 2.3*  --  2.1* 2.2*  --    < > = values in this interval not displayed.   Liver Function Tests Recent Labs    01/22/20 0319 01/22/20 1546 01/23/20 1754 01/24/20 0245  AST 36  --   --   --   ALT 45*  --   --   --   ALKPHOS 122  --   --   --   BILITOT 1.1  --   --   --   PROT 5.5*  --   --   --   ALBUMIN 2.0*   < > 1.8* 1.8*   < > = values in this interval not displayed.   No results for input(s): LIPASE, AMYLASE in the last 72 hours. Cardiac Enzymes No results for input(s): CKTOTAL, CKMB, CKMBINDEX, TROPONINI in the last 72 hours.  BNP: BNP (last 3 results) Recent Labs    01/24/2020 1619  BNP 577.5*    ProBNP (last 3 results) No results for input(s): PROBNP in the last 8760 hours.   D-Dimer No results for input(s): DDIMER in the last 72 hours. Hemoglobin A1C No results for input(s): HGBA1C in the last 72 hours. Fasting Lipid Panel No results for input(s): CHOL, HDL, LDLCALC, TRIG, CHOLHDL, LDLDIRECT in the last 72  hours. Thyroid Function Tests No results for input(s): TSH, T4TOTAL, T3FREE, THYROIDAB in the last 72 hours.  Invalid input(s): FREET3  Other results:   Imaging    No results found.   Medications:     Scheduled Medications: . sodium chloride   Intravenous Once  . aspirin  324 mg Per Tube Daily  . atorvastatin  80 mg Per Tube Daily  . B-complex with vitamin C  1 tablet Per Tube Daily  . busPIRone  10 mg Per Tube TID  . chlorhexidine gluconate (MEDLINE KIT)  15 mL Mouth Rinse BID  . Chlorhexidine Gluconate Cloth  6 each Topical Daily  . darbepoetin (ARANESP) injection - NON-DIALYSIS  60 mcg Subcutaneous Q Sat-1800  . feeding supplement (PIVOT 1.5 CAL)  1,000 mL Per Tube Q24H  . FLUoxetine  40 mg Per Tube BID  . mouth rinse  15 mL Mouth Rinse 10 times per day  . nystatin  5 mL Oral QID  . pantoprazole sodium  40 mg Per Tube Daily  . sodium chloride flush  10-40 mL Intracatheter Q12H  . sodium chloride flush  3 mL Intravenous Q12H  . valproic acid  250 mg Per Tube BID  . vancomycin variable dose per unstable renal function (pharmacist dosing)   Does not apply See admin instructions    Infusions: .  prismasol BGK 4/2.5 500 mL/hr at 01/24/20 0522  .  prismasol BGK 4/2.5 200 mL/hr at 01/22/20 1438  . sodium chloride Stopped (01/23/2020 1646)  . sodium chloride Stopped (01/23/20 1100)  . sodium chloride Stopped (01/16/20 1032)  . amiodarone 30 mg/hr (01/24/20 0700)  . DOPamine Stopped (01/21/20 1559)  . epinephrine 8 mcg/min (01/24/20 0241)  . fentaNYL infusion INTRAVENOUS 150 mcg/hr (01/24/20 0700)  . impella catheter heparin 50 unit/mL in dextrose 5% 9 mL/hr at 01/21/20 0015  . insulin 0.7 Units/hr (01/24/20 0646)  . lactated ringers 20 mL/hr at 01/24/20 0700  . meropenem (MERREM) IV 1 g (01/24/20 0155)  . midazolam 2 mg/hr (01/24/20 0700)  . milrinone 0.25 mcg/kg/min (01/24/20 0157)  . norepinephrine (LEVOPHED) Adult infusion 20 mcg/min (01/24/20 0647)  .  prismasol BGK  4/2.5 1,500 mL/hr at 01/24/20 0359  . vasopressin 0.04 Units/min (01/24/20 0700)    PRN Medications: sodium chloride, sodium chloride, bisacodyl **OR** bisacodyl, dextrose, docusate, fentaNYL, fentaNYL (SUBLIMAZE) injection, fentaNYL (SUBLIMAZE) injection, heparin, heparin, metoprolol tartrate, midazolam, midazolam, midazolam, ondansetron (ZOFRAN) IV, oxyCODONE, polyethylene glycol, sodium chloride flush, sodium chloride flush     Assessment/Plan   1. Cardiogenic shock/acute on chronic systolic CHF: Initially nonischemic cardiomyopathy. Possible familial cardiomyopathy as both parents had cardiomyopathy and died at around 60.However, [employer] gene testing did not show any common mutation for cardiomyopathy.  However, this admission noted to have severe 3 vessel disease so suspect component of ischemic cardiomyopathy. [employer] Jude ICD. Echo in 8/20 with EF 15% and mildly decreased RV function. Echo this admission with EF < 20%, moderate LV dilation, RV mildly reduced, severe LAE, no significant MR.Low output HF with markedly low EF.  He has a long history of cardiomyopathy (20 yrs), tends to minimize symptoms.  Cardiorenal syndrome with creatinine up to 2.2,  stabilized on milrinone and Impella 5.5. SCr 1.44 day of CABG. CABG 12/15.  Post-op shock, back to OR 12/16 to open chest (chest wall compartment syndrome) and again to evacuate hematoma.  TEE post-op with severe RV dysfunction.  CVVHD initiated 12/19 for volume removal in setting of rising Scr/decreased UOP. Suspect ATN. S/p chest closure 12/23. Hemodynamics much worse on 12/24 with rising pressor demands. Co-ox 36% Bedside echo severe biventricular dysfunction with marked septal bounce. Chest re-opened 12/24. Hemodynamics improved after resumption of NSR.  - Remains on high-dose pressors (NE 20, Epi 8, VP 0.04, milrinone 0.25)  MAPS 70-80s, better in NSR.  - Wean pressors slowly today for MAP 65.  - Co-ox 70% - Remains on  CVVHD. Keeping even  now but will pull -50 as toelrated - Continue Impella at current level (P-7) Waveforms ok  2. Atrial fibrillation: H/o PAF.  He was on dofetilide in the remote past but this was stopped due to noncompliance. He had an upper GI bleed from antral ulcers in 3/12. He was seen by GI and was deemed safe to restart anticoagulation as long as he remains on a PPI.  No apparent recurrence of AF until just prior to this admission, was cardioverted back to NSR in ER but went back into atrial fibrillation.  Post-op afib, DCCV to NSR/BiV pacing am 12/16 -> back to AF. Rapid atrial pacing back to SR on 12/26 - Continue IV amio - Off heparin post-op with bleeding, only getting heparin in Impella purge currently. Unable to restart [facility] currently with high risk of bleeding.  - Did not have Maze with CABG.    3. CAD:   Cath this admission with severe 3 vessel disease. CABG x 4 on 12/15.  - Continue atorvastatin, ASA.  - continue current management 4. Acute on chronic hypoxemic respiratory failure: Vent per CCM.  - Remains on vent - CCM following - Suspect will need tach soon but complicated by open chest  5. AKI on CKD stage 3: Likely ATN/cardiorenal - remains on CVVHD. Keeping even for now with hemodynamic instability. Pull -50/hr as will tolerate - Continue CVVHD per [facility]. Appreciate their help.  6. Diabetes: Insulin.  7. Hyponatremia: Resolved.  8. Torsades/ICD shock: 12/7/212 in hospital event, in setting of severe hypokalemia and hypomagnesemia.   9. Gout: h/o gout. Complained of rt knee pain c/w previous flares - treated w/ prednisone burst (completed).  10: ID: Treating for PNA. - WBC trending down.  -  On vancomycin/meropenem (PCN allergy).  - can add fungal coverage as needed 11. FEN: Off TFs with emesis 2 days ago.  Discuss resumption with CCM.   Coming down on norepi.    Amy Clegg NP-C  01/24/2020 7:09 AM  Patient seen with NP, agree with the above note.    More stable in NSR, gradually  decreasing pressors.  CI by thermo and co-ox adequate today, CVP 5 though weight still above baseline.    Remains on vancomycin/meropenem coverage for PNA, afebrile today with WBCs still elevated at 22K.   General: Sedated/intubated.  Neck: No JVD, no thyromegaly or thyroid nodule.  Lungs: Decreased at bases.  CV: Chest wall open.  Heart regular S1/S2, no S3/S4, no murmur.  Trace ankle edema. Abdomen: Soft, nontender, no hepatosplenomegaly, no distention.  Skin: Intact without lesions or rashes.  Neurologic: Per nurse, will wake up with heavy stimulation and follow some commands.  Extremities: No clubbing or cyanosis.  HEENT: Normal.   Stable hemodynamics on current support.  Does better in NSR, will need to maintain.  - Continue amiodarone gtt.  - Not on heparin systemically due to bleeding risk, only in purge.  - Continue current Impella P7, no alarms.  - CVP 5, but weight still up from baseline.  Run CVVH to keep around -50 cc/hr net.  - Wean NE and epinephrine slowly.    Continues on vancomycin and meropenem for PNA, afebrile.   Suspect will need tracheostomy but complicated currently by open chest.   No TFs currently, discuss restarting with CCM.    CRITICAL CARE Performed by: Loralie Champagne  Total critical care time: 40 minutes  Critical care time was exclusive of separately billable procedures and treating other patients.  Critical care was necessary to treat or prevent imminent or life-threatening deterioration.  Critical care was time spent personally by me on the following activities: development of treatment plan with patient and/or surrogate as well as nursing, discussions with consultants, evaluation of patient's response to treatment, examination of patient, obtaining history from patient or surrogate, ordering and performing treatments and interventions, ordering and review of laboratory studies, ordering and review of radiographic studies, pulse oximetry and  re-evaluation of patient's condition.  Loralie Champagne 01/24/2020 7:34 AM

## 2020-01-24 NOTE — Progress Notes (Signed)
[name] for heparin Indication: Impella 5.5  Allergies  Allergen Reactions  . Orange Fruit Anaphylaxis, Hives and Other (See Comments)    Per Pt- Blisters around lips and Hives all over, also  . Penicillins Anaphylaxis    Did it involve swelling of the face/tongue/throat, SOB, or low BP? Yes Did it involve sudden or severe rash/hives, skin peeling, or any reaction on the inside of your mouth or nose? Yes Did you need to seek medical attention at a hospital or doctor's office? No When did it last happen?childhood If all above answers are "NO", may proceed with cephalosporin use.  Corey Palmer [Empagliflozin] Itching  . Basaglar Kwikpen [Insulin Glargine] Nausea And Vomiting    Patient Measurements: Height: 6\' 3"  (190.5 cm) Weight: 109.6 kg (241 lb 10 oz) IBW/kg (Calculated) : 84.5 Heparin dosing wt: 101 kg  Vital Signs: Temp: 98.6 F (37 C) (12/27 0700) Temp Source: [place] (12/26 2000) BP: 93/56 (12/27 0350) Pulse Rate: 89 (12/27 0700)  Labs: Recent Labs    01/22/20 0319 01/22/20 0336 01/22/20 1604 01/22/20 2340 01/23/20 0415 01/23/20 0426 01/23/20 1754 01/24/20 0245 01/24/20 0647  HGB 8.1*   < > 7.8*   < > 7.3* 7.8*  --  8.4* 8.5*  HCT 26.4*   < > 25.3*   < > 23.8* 23.0*  --  27.4* 25.0*  PLT 305  --  313  --  296  --   --  247  --   HEPARINUNFRC <0.10*  --   --   --  <0.10*  --   --  <0.10*  --   CREATININE 1.93*   < >  --   --  1.84*  --  1.90* 1.88*  --    < > = values in this interval not displayed.    Estimated Creatinine Clearance: 62.1 mL/min (A) (by C-G formula based on SCr of 1.88 mg/dL (H)).   Assessment: 49 yo male s/p impella 5.5 placement. Pt started on heparinized purge and systemic heparin. Pt now s/p CABG 12/15 with Impella remaining in place c/b significant bleeding and hematoma requiring opened chest and reexploration in OR 12/16. Chest closed on 12/23, re-opened on 12/24.  Impella on P7 with stable  purge pressures. Heparin running via purge only at this time, purge flow rate 8.9 ml/hr (approx 445 units/hr heparin). Heparin level undetectable as expected, Hgb at 8.5, s/p PRBC yesterday. LDH stable at 357. No s/sx of bleeding per nursing.   Goal of Therapy:  Heparin level 0.2-0.5 units/ml Monitor platelets by anticoagulation protocol: Yes   Plan:  Continue heparin via the purge only  Will follow with CVTS regarding systemic heparin once appropriate to initiate Daily heparin level for now  Corey Palmer, PharmD, [place] Pharmacist  Phone: [phone] 01/24/2020 7:21 AM  Please check AMION for all [place] phone numbers After 10:00 PM, call [place] [phone]

## 2020-01-24 NOTE — Progress Notes (Signed)
[facility] NEPHROLOGY PROGRESS NOTE  Assessment/ Plan:  #AKI/CKD stage III- presumably due to ischemic ATN in setting of cardiogenic shock s/p CABG.  CRRT started 01/16/20. All fluids 4K/2.5Ca: Pre-filter 500 ml/hr, post filter 200 ml/hr, dialysate 1500 ml/hr. No heparin due to hematoma. Right femoral HD catheter placed 01/16/20. -Tolerating CRRT well, no problem with filter.  CVP around 7 however has extensive lower extremity edema.  Plan for net UF around 50 cc/hour.  # CAD s/p CABG complicated by cardiogenic shock and post-op hematoma. secondary closure on 12/23 then had to be re opened.  # Cardiogenic shock- underlying familial cardiomyopathy s/p redo CABG. Remains on pressors: Levophed, epinephrine, and vasopressin. Impella and milrinone.   # ABLA- s/p postoperative bleeding into chest cavity s/p re-exploration for tamponade/hematoma. CT surgery following.stable, added ESA.  #Hypophosphatemia- due to CRRT. Replete K phos.  # Atrial fibrillation - s/p DCCV on 12/16.  On amiodarone.  # VDRF- per primary svc.  #-Leukocytosis.On vancomycin and meropenem.   Per primary team.  Subjective: Seen and examined.  Tolerating CRRT well.  Remains on ventilator and multiple pressors.  No new event. Objective Vital signs in last 24 hours: Vitals:   01/24/20 0655 01/24/20 0700 01/24/20 0715 01/24/20 0730  BP:      Pulse: 90 89 87 86  Resp: (!) 25 (!) 28 (!) 28 (!) 22  Temp: 98.6 F (37 C) 98.6 F (37 C) 98.42 F (36.9 C) 98.24 F (36.8 C)  TempSrc:      SpO2: 100% 100% 100% 100%  Weight:      Height:       Weight change: 0.6 kg  Intake/Output Summary (Last 24 hours) at 01/24/2020 0758 Last data filed at 01/24/2020 0700 Gross per 24 hour  Intake 4712.92 ml  Output 4221 ml  Net 491.92 ml       Labs: Basic Metabolic Panel: Recent Labs  Lab 01/23/20 0415 01/23/20 0426 01/23/20 1754 01/24/20 0245 01/24/20 0647  NA 134*   < > 132* 135 138  K  4.0   < > 3.8 3.9 4.1  CL 103  --  103 103  --   CO2 23  --  21* 23  --   GLUCOSE 111*  --  110* 119*  --   BUN 21*  --  19 18  --   CREATININE 1.84*  --  1.90* 1.88*  --   CALCIUM 7.9*  --  7.7* 8.1*  --   PHOS 2.3*  --  2.1* 2.2*  --    < > = values in this interval not displayed.   Liver Function Tests: Recent Labs  Lab 01/19/2020 1652 01/21/20 0410 01/21/20 1551 01/22/20 0319 01/22/20 1546 01/23/20 0415 01/23/20 1754 01/24/20 0245  AST 42* 39  --  36  --   --   --   --   ALT 76* 62*  --  45*  --   --   --   --   ALKPHOS 162* 134*  --  122  --   --   --   --   BILITOT 1.2 1.3*  --  1.1  --   --   --   --   PROT 6.0* 6.2*  --  5.5*  --   --   --   --   ALBUMIN 2.1* 2.3*   < > 2.0*   < > 1.8* 1.8* 1.8*   < > = values in this interval not displayed.  No results for input(s): LIPASE, AMYLASE in the last 168 hours. No results for input(s): AMMONIA in the last 168 hours. CBC: Recent Labs  Lab 01/21/20 1551 01/21/20 2007 01/22/20 0319 01/22/20 0336 01/22/20 1604 01/22/20 2340 01/23/20 0415 01/23/20 0426 01/24/20 0245 01/24/20 0647  WBC 33.8*  --  31.3*  --  31.9*  --  27.8*  --  22.0*  --   NEUTROABS  --   --  27.1*  --   --   --  24.1*  --  19.3*  --   HGB 8.9*   < > 8.1*   < > 7.8*   < > 7.3* 7.8* 8.4* 8.5*  HCT 28.4*   < > 26.4*   < > 25.3*   < > 23.8* 23.0* 27.4* 25.0*  MCV 93.4  --  94.6  --  94.4  --  93.3  --  92.6  --   PLT 352  --  305  --  313  --  296  --  247  --    < > = values in this interval not displayed.   Cardiac Enzymes: No results for input(s): CKTOTAL, CKMB, CKMBINDEX, TROPONINI in the last 168 hours. CBG: Recent Labs  Lab 01/23/20 2323 01/24/20 0033 01/24/20 0252 01/24/20 0533 01/24/20 0644  GLUCAP 124* 118* 118* 107* 104*    Iron Studies: No results for input(s): IRON, TIBC, TRANSFERRIN, FERRITIN in the last 72 hours. Studies/Results: No results found.  Medications: Infusions: .  prismasol BGK 4/2.5 500 mL/hr at 01/24/20 0522   .  prismasol BGK 4/2.5 200 mL/hr at 01/22/20 1438  . sodium chloride Stopped (01/19/2020 1646)  . sodium chloride Stopped (01/23/20 1100)  . sodium chloride Stopped (01/16/20 1032)  . amiodarone 30 mg/hr (01/24/20 0700)  . DOPamine Stopped (01/21/20 1559)  . epinephrine 8 mcg/min (01/24/20 0241)  . fentaNYL infusion INTRAVENOUS 150 mcg/hr (01/24/20 0700)  . impella catheter heparin 50 unit/mL in dextrose 5% 9 mL/hr at 01/21/20 0015  . insulin 0.7 Units/hr (01/24/20 0646)  . lactated ringers 20 mL/hr at 01/24/20 0700  . meropenem (MERREM) IV 1 g (01/24/20 0155)  . midazolam 2 mg/hr (01/24/20 0700)  . milrinone 0.25 mcg/kg/min (01/24/20 0157)  . norepinephrine (LEVOPHED) Adult infusion 20 mcg/min (01/24/20 0647)  . prismasol BGK 4/2.5 1,500 mL/hr at 01/24/20 0359  . vasopressin 0.04 Units/min (01/24/20 0700)    Scheduled Medications: . sodium chloride   Intravenous Once  . aspirin  324 mg Per Tube Daily  . atorvastatin  80 mg Per Tube Daily  . B-complex with vitamin C  1 tablet Per Tube Daily  . busPIRone  10 mg Per Tube TID  . chlorhexidine gluconate (MEDLINE KIT)  15 mL Mouth Rinse BID  . Chlorhexidine Gluconate Cloth  6 each Topical Daily  . darbepoetin (ARANESP) injection - NON-DIALYSIS  60 mcg Subcutaneous Q Sat-1800  . feeding supplement (PIVOT 1.5 CAL)  1,000 mL Per Tube Q24H  . FLUoxetine  40 mg Per Tube BID  . mouth rinse  15 mL Mouth Rinse 10 times per day  . nystatin  5 mL Oral QID  . pantoprazole sodium  40 mg Per Tube Daily  . sodium chloride flush  10-40 mL Intracatheter Q12H  . sodium chloride flush  3 mL Intravenous Q12H  . valproic acid  250 mg Per Tube BID  . vancomycin variable dose per unstable renal function (pharmacist dosing)   Does not apply See admin instructions    have  reviewed scheduled and prn medications.  Physical Exam: General: Critically ill male intubated and sedated. Heart:RRR, s1s2 nl Lungs open external wound.  Coarse breath  sound. Abdomen:soft, non-distended Extremities: Bilateral lower extremity edema present Dialysis Access: Catheter  Nakisha Chai Prasad Gale Hulse 01/24/2020,7:58 AM  LOS: 25 days  Pager: [phone]

## 2020-01-24 NOTE — Progress Notes (Signed)
Pharmacy Antibiotic Note  Corey CHEATUM is a 49 y.o. male admitted on 01/08/2020  s/p CABG with Impella placement, remains with open chest with concerns for possible sepsis. Pharmacy has been consulted for vancomycin and meropenem dosing.  Chest re-opened 12/24. CRRT for volume removal. Vanc trough came back slightly supratherapeutic at 21, last received dose on 12/25 at 1307. WBC trending down 22, Scr 1.88 on CRRT, LA 1.3.   Plan: Will restart vancomycin 1g IV every 24 hours on 12/27@1300  (48 hr from last dose) Continue meropenem 1 gm IV q8 hrs Monitor renal function, cultures, clinical progression Check vancomycin levels as indicated.  Height: 6\' 3"  (190.5 cm) Weight: 109.6 kg (241 lb 10 oz) IBW/kg (Calculated) : 84.5  Temp (24hrs), Avg:98.9 F (37.2 C), Min:97.88 F (36.6 C), Max:100.04 F (37.8 C)  Recent Labs  Lab 01/17/20 1525 01/18/20 0355 01/18/20 0430 01/18/20 1517 01/19/20 0338 01/21/20 1551 01/22/20 0319 01/22/20 1208 01/22/20 1546 01/22/20 1604 01/23/20 0415 01/23/20 1754 01/24/20 0245  WBC 16.8*  --    < >  --    < > 33.8* 31.3*  --   --  31.9* 27.8*  --  22.0*  CREATININE 1.66*  --    < >  --    < > 1.78* 1.93*  --  1.87*  --  1.84* 1.90* 1.88*  LATICACIDVEN 0.7 0.7  --  0.7  --  1.3  --   --   --   --   --   --   --   VANCOTROUGH  --   --    < >  --   --   --   --  31*  --   --   --   --  21*   < > = values in this interval not displayed.    Estimated Creatinine Clearance: 62.1 mL/min (A) (by C-G formula based on SCr of 1.88 mg/dL (H)).    Allergies  Allergen Reactions  . Orange Fruit Anaphylaxis, Hives and Other (See Comments)    Per Pt- Blisters around lips and Hives all over, also  . Penicillins Anaphylaxis    Did it involve swelling of the face/tongue/throat, SOB, or low BP? Yes Did it involve sudden or severe rash/hives, skin peeling, or any reaction on the inside of your mouth or nose? Yes Did you need to seek medical attention at a hospital or  doctor's office? No When did it last happen?childhood If all above answers are "NO", may proceed with cephalosporin use.  [name] [Empagliflozin] Itching  . [name] [Insulin Glargine] Nausea And Vomiting    Antimicrobials this admission: Vancomycin 12/15 >>  Meropenem 12/16 >>   Dose adjustments this admission: 12/18: VT 26 - decrease to 750 mg IV q12 hrs 12/19: starting CRRT - adjust Vanc to 1500 mg IV q24 hrs 12/21 VT 27 - decrease Vanc to 1250 q24hr 12/25 VT 31 - hold vancomycin for 12/26 12/27 VT 21 - restart vanc 1g IV q24hr  Microbiology results: 12/16 BCx: ngtd 12/14 BCx: ngtd  12/11 MRSA PCR: neg  [name], PharmD, B[facility] Clinical Pharmacist  Phone: [phone] 01/24/2020 8:42 AM  Please check AMION for all [place] phone numbers After 10:00 PM, call [place] [phone]

## 2020-01-25 ENCOUNTER — Encounter (HOSPITAL_COMMUNITY): Admission: EM | Disposition: E | Payer: Self-pay | Source: Home / Self Care | Attending: Cardiothoracic Surgery

## 2020-01-25 ENCOUNTER — Inpatient Hospital Stay (HOSPITAL_COMMUNITY): Payer: BC Managed Care – PPO

## 2020-01-25 ENCOUNTER — Inpatient Hospital Stay (HOSPITAL_COMMUNITY): Payer: BC Managed Care – PPO | Admitting: Anesthesiology

## 2020-01-25 DIAGNOSIS — I5021 Acute systolic (congestive) heart failure: Secondary | ICD-10-CM

## 2020-01-25 DIAGNOSIS — R570 Cardiogenic shock: Secondary | ICD-10-CM | POA: Diagnosis not present

## 2020-01-25 HISTORY — PX: STERNAL WOUND DEBRIDEMENT: SHX1058

## 2020-01-25 HISTORY — PX: APPLICATION OF WOUND VAC: SHX5189

## 2020-01-25 LAB — CBC WITH DIFFERENTIAL/PLATELET
Abs Immature Granulocytes: 0.24 10*3/uL — ABNORMAL HIGH (ref 0.00–0.07)
Basophils Absolute: 0.1 10*3/uL (ref 0.0–0.1)
Basophils Relative: 0 %
Eosinophils Absolute: 0.2 10*3/uL (ref 0.0–0.5)
Eosinophils Relative: 1 %
HCT: 27.9 % — ABNORMAL LOW (ref 39.0–52.0)
Hemoglobin: 9 g/dL — ABNORMAL LOW (ref 13.0–17.0)
Immature Granulocytes: 1 %
Lymphocytes Relative: 3 %
Lymphs Abs: 0.6 10*3/uL — ABNORMAL LOW (ref 0.7–4.0)
MCH: 29.3 pg (ref 26.0–34.0)
MCHC: 32.3 g/dL (ref 30.0–36.0)
MCV: 90.9 fL (ref 80.0–100.0)
Monocytes Absolute: 1.4 10*3/uL — ABNORMAL HIGH (ref 0.1–1.0)
Monocytes Relative: 6 %
Neutro Abs: 18.7 10*3/uL — ABNORMAL HIGH (ref 1.7–7.7)
Neutrophils Relative %: 89 %
Platelets: 256 10*3/uL (ref 150–400)
RBC: 3.07 MIL/uL — ABNORMAL LOW (ref 4.22–5.81)
RDW: 21.2 % — ABNORMAL HIGH (ref 11.5–15.5)
WBC: 21.3 10*3/uL — ABNORMAL HIGH (ref 4.0–10.5)
nRBC: 0.4 % — ABNORMAL HIGH (ref 0.0–0.2)

## 2020-01-25 LAB — POCT I-STAT 7, (LYTES, BLD GAS, ICA,H+H)
Acid-Base Excess: 0 mmol/L (ref 0.0–2.0)
Acid-base deficit: 1 mmol/L (ref 0.0–2.0)
Acid-base deficit: 1 mmol/L (ref 0.0–2.0)
Acid-base deficit: 2 mmol/L (ref 0.0–2.0)
Bicarbonate: 23.9 mmol/L (ref 20.0–28.0)
Bicarbonate: 24.7 mmol/L (ref 20.0–28.0)
Bicarbonate: 24.8 mmol/L (ref 20.0–28.0)
Bicarbonate: 23.4 mmol/L (ref 20.0–28.0)
Calcium, Ion: 1.13 mmol/L — ABNORMAL LOW (ref 1.15–1.40)
Calcium, Ion: 1.15 mmol/L (ref 1.15–1.40)
Calcium, Ion: 1.11 mmol/L — ABNORMAL LOW (ref 1.15–1.40)
Calcium, Ion: 1.18 mmol/L (ref 1.15–1.40)
HCT: 24 % — ABNORMAL LOW (ref 39.0–52.0)
HCT: 27 % — ABNORMAL LOW (ref 39.0–52.0)
HCT: 29 % — ABNORMAL LOW (ref 39.0–52.0)
HCT: 27 % — ABNORMAL LOW (ref 39.0–52.0)
Hemoglobin: 8.2 g/dL — ABNORMAL LOW (ref 13.0–17.0)
Hemoglobin: 9.2 g/dL — ABNORMAL LOW (ref 13.0–17.0)
Hemoglobin: 9.9 g/dL — ABNORMAL LOW (ref 13.0–17.0)
Hemoglobin: 9.2 g/dL — ABNORMAL LOW (ref 13.0–17.0)
O2 Saturation: 97 %
O2 Saturation: 99 %
O2 Saturation: 100 %
O2 Saturation: 95 %
Patient temperature: 37.7
Patient temperature: 35.5
Patient temperature: 37.6
Patient temperature: 36.2
Potassium: 4 mmol/L (ref 3.5–5.1)
Potassium: 3.8 mmol/L (ref 3.5–5.1)
Potassium: 4.4 mmol/L (ref 3.5–5.1)
Potassium: 3.9 mmol/L (ref 3.5–5.1)
Sodium: 137 mmol/L (ref 135–145)
Sodium: 138 mmol/L (ref 135–145)
Sodium: 136 mmol/L (ref 135–145)
Sodium: 135 mmol/L (ref 135–145)
TCO2: 25 mmol/L (ref 22–32)
TCO2: 26 mmol/L (ref 22–32)
TCO2: 26 mmol/L (ref 22–32)
TCO2: 25 mmol/L (ref 22–32)
pCO2 arterial: 40.7 mmHg (ref 32.0–48.0)
pCO2 arterial: 38.5 mmHg (ref 32.0–48.0)
pCO2 arterial: 44.6 mmHg (ref 32.0–48.0)
pCO2 arterial: 40.7 mmHg (ref 32.0–48.0)
pH, Arterial: 7.38 (ref 7.350–7.450)
pH, Arterial: 7.409 (ref 7.350–7.450)
pH, Arterial: 7.355 (ref 7.350–7.450)
pH, Arterial: 7.364 (ref 7.350–7.450)
pO2, Arterial: 92 mmHg (ref 83.0–108.0)
pO2, Arterial: 123 mmHg — ABNORMAL HIGH (ref 83.0–108.0)
pO2, Arterial: 225 mmHg — ABNORMAL HIGH (ref 83.0–108.0)
pO2, Arterial: 77 mmHg — ABNORMAL LOW (ref 83.0–108.0)

## 2020-01-25 LAB — CBC
HCT: 28.1 % — ABNORMAL LOW (ref 39.0–52.0)
Hemoglobin: 8.6 g/dL — ABNORMAL LOW (ref 13.0–17.0)
MCH: 28.4 pg (ref 26.0–34.0)
MCHC: 30.6 g/dL (ref 30.0–36.0)
MCV: 92.7 fL (ref 80.0–100.0)
Platelets: 257 10*3/uL (ref 150–400)
RBC: 3.03 MIL/uL — ABNORMAL LOW (ref 4.22–5.81)
RDW: 21.2 % — ABNORMAL HIGH (ref 11.5–15.5)
WBC: 19.6 10*3/uL — ABNORMAL HIGH (ref 4.0–10.5)
nRBC: 0.4 % — ABNORMAL HIGH (ref 0.0–0.2)

## 2020-01-25 LAB — RENAL FUNCTION PANEL
Albumin: 1.9 g/dL — ABNORMAL LOW (ref 3.5–5.0)
Anion gap: 9 (ref 5–15)
BUN: 17 mg/dL (ref 6–20)
CO2: 22 mmol/L (ref 22–32)
Calcium: 7.9 mg/dL — ABNORMAL LOW (ref 8.9–10.3)
Chloride: 103 mmol/L (ref 98–111)
Creatinine, Ser: 1.83 mg/dL — ABNORMAL HIGH (ref 0.61–1.24)
GFR, Estimated: 44 mL/min — ABNORMAL LOW (ref >60)
Glucose, Bld: 175 mg/dL — ABNORMAL HIGH (ref 70–99)
Phosphorus: 3.4 mg/dL (ref 2.5–4.6)
Potassium: 3.9 mmol/L (ref 3.5–5.1)
Sodium: 134 mmol/L — ABNORMAL LOW (ref 135–145)

## 2020-01-25 LAB — POCT I-STAT, CHEM 8
BUN: 18 mg/dL (ref 6–20)
Calcium, Ion: 1.12 mmol/L — ABNORMAL LOW (ref 1.15–1.40)
Chloride: 105 mmol/L (ref 98–111)
Creatinine, Ser: 1.7 mg/dL — ABNORMAL HIGH (ref 0.61–1.24)
Glucose, Bld: 127 mg/dL — ABNORMAL HIGH (ref 70–99)
HCT: 30 % — ABNORMAL LOW (ref 39.0–52.0)
Hemoglobin: 10.2 g/dL — ABNORMAL LOW (ref 13.0–17.0)
Potassium: 4.3 mmol/L (ref 3.5–5.1)
Sodium: 135 mmol/L (ref 135–145)
TCO2: 23 mmol/L (ref 22–32)

## 2020-01-25 LAB — GLUCOSE, CAPILLARY
Glucose-Capillary: 124 mg/dL — ABNORMAL HIGH (ref 70–99)
Glucose-Capillary: 124 mg/dL — ABNORMAL HIGH (ref 70–99)
Glucose-Capillary: 125 mg/dL — ABNORMAL HIGH (ref 70–99)
Glucose-Capillary: 125 mg/dL — ABNORMAL HIGH (ref 70–99)
Glucose-Capillary: 128 mg/dL — ABNORMAL HIGH (ref 70–99)
Glucose-Capillary: 128 mg/dL — ABNORMAL HIGH (ref 70–99)
Glucose-Capillary: 129 mg/dL — ABNORMAL HIGH (ref 70–99)
Glucose-Capillary: 129 mg/dL — ABNORMAL HIGH (ref 70–99)
Glucose-Capillary: 131 mg/dL — ABNORMAL HIGH (ref 70–99)
Glucose-Capillary: 137 mg/dL — ABNORMAL HIGH (ref 70–99)
Glucose-Capillary: 138 mg/dL — ABNORMAL HIGH (ref 70–99)
Glucose-Capillary: 141 mg/dL — ABNORMAL HIGH (ref 70–99)
Glucose-Capillary: 142 mg/dL — ABNORMAL HIGH (ref 70–99)
Glucose-Capillary: 151 mg/dL — ABNORMAL HIGH (ref 70–99)
Glucose-Capillary: 176 mg/dL — ABNORMAL HIGH (ref 70–99)
Glucose-Capillary: 179 mg/dL — ABNORMAL HIGH (ref 70–99)
Glucose-Capillary: 87 mg/dL (ref 70–99)

## 2020-01-25 LAB — LACTATE DEHYDROGENASE: LDH: 349 U/L — ABNORMAL HIGH (ref 98–192)

## 2020-01-25 LAB — COOXEMETRY PANEL
Carboxyhemoglobin: 1.4 % (ref 0.5–1.5)
Methemoglobin: 0.9 % (ref 0.0–1.5)
O2 Saturation: 64.2 %
Total hemoglobin: 9.2 g/dL — ABNORMAL LOW (ref 12.0–16.0)

## 2020-01-25 LAB — AEROBIC/ANAEROBIC CULTURE W GRAM STAIN (SURGICAL/DEEP WOUND): Culture: NO GROWTH

## 2020-01-25 LAB — ECHOCARDIOGRAM LIMITED
Height: 75 in
Weight: 3876.57 oz

## 2020-01-25 LAB — MAGNESIUM
Magnesium: 2.4 mg/dL (ref 1.7–2.4)
Magnesium: 2.4 mg/dL (ref 1.7–2.4)

## 2020-01-25 LAB — HEPARIN LEVEL (UNFRACTIONATED): Heparin Unfractionated: <0.1 IU/mL — ABNORMAL LOW (ref 0.30–0.70)

## 2020-01-25 SURGERY — DEBRIDEMENT, WOUND, STERNUM
Anesthesia: General

## 2020-01-25 MED ORDER — MIDAZOLAM HCL 2 MG/2ML IJ SOLN
INTRAMUSCULAR | Status: AC
  Filled 2020-01-25: qty 2

## 2020-01-25 MED ORDER — MIDAZOLAM HCL 2 MG/2ML IJ SOLN
INTRAMUSCULAR | Status: DC | PRN
  Administered 2020-01-25 (×2): 1 mg via INTRAVENOUS

## 2020-01-25 MED ORDER — SODIUM CHLORIDE 0.9 % IR SOLN
Status: DC | PRN
  Administered 2020-01-25: 1000 mL

## 2020-01-25 MED ORDER — NUTRISOURCE FIBER PO PACK
1.0000 | PACK | Freq: Two times a day (BID) | ORAL | Status: DC
  Administered 2020-01-25 – 2020-03-07 (×82): 1
  Filled 2020-01-25 (×87): qty 1

## 2020-01-25 MED ORDER — 0.9 % SODIUM CHLORIDE (POUR BTL) OPTIME
TOPICAL | Status: DC | PRN
Start: 2020-01-25 — End: 2020-01-25
  Administered 2020-01-25: 11:00:00 1000 mL

## 2020-01-25 MED ORDER — STERILE WATER FOR IRRIGATION IR SOLN
Status: DC | PRN
  Administered 2020-01-25: 1000 mL

## 2020-01-25 MED ORDER — ROCURONIUM BROMIDE 10 MG/ML (PF) SYRINGE
PREFILLED_SYRINGE | INTRAVENOUS | Status: DC | PRN
  Administered 2020-01-25: 50 mg via INTRAVENOUS

## 2020-01-25 MED ORDER — ARTIFICIAL TEARS OPHTHALMIC OINT
TOPICAL_OINTMENT | OPHTHALMIC | Status: DC | PRN
  Administered 2020-01-25 – 2020-01-26 (×2): 1 via OPHTHALMIC
  Filled 2020-01-25 (×2): qty 3.5

## 2020-01-25 MED ORDER — LACTATED RINGERS IV SOLN: INTRAVENOUS | Status: DC | PRN

## 2020-01-25 MED ORDER — COLCHICINE 0.6 MG PO TABS
0.6000 mg | ORAL_TABLET | Freq: Two times a day (BID) | ORAL | Status: DC
  Administered 2020-01-25: 09:00:00 0.6 mg
  Filled 2020-01-25: qty 1

## 2020-01-25 MED ORDER — FENTANYL CITRATE (PF) 250 MCG/5ML IJ SOLN
INTRAMUSCULAR | Status: AC
  Filled 2020-01-25: qty 5

## 2020-01-25 SURGICAL SUPPLY — 60 items
APL SKNCLS STERI-STRIP NONHPOA (GAUZE/BANDAGES/DRESSINGS) ×2
BAG DECANTER FOR FLEXI CONT (MISCELLANEOUS) IMPLANT
BANDAGE ESMARK 6X9 LF (GAUZE/BANDAGES/DRESSINGS) IMPLANT
BENZOIN TINCTURE PRP APPL 2/3 (GAUZE/BANDAGES/DRESSINGS) ×4 IMPLANT
BLADE CLIPPER SURG (BLADE) IMPLANT
BLADE SURG 10 STRL SS (BLADE) IMPLANT
BNDG CMPR 9X6 STRL LF SNTH (GAUZE/BANDAGES/DRESSINGS)
BNDG ESMARK 6X9 LF (GAUZE/BANDAGES/DRESSINGS)
BNDG GAUZE ELAST 4 BULKY (GAUZE/BANDAGES/DRESSINGS) IMPLANT
CANISTER SUCT 3000ML PPV (MISCELLANEOUS) ×2 IMPLANT
CANISTER WOUND CARE 500ML ATS (WOUND CARE) ×2 IMPLANT
CATH FOLEY 2WAY SLVR  5CC 16FR (CATHETERS)
CATH FOLEY 2WAY SLVR 5CC 16FR (CATHETERS) IMPLANT
CLIP VESOCCLUDE SM WIDE 24/CT (CLIP) IMPLANT
CNTNR URN SCR LID CUP LEK RST (MISCELLANEOUS) IMPLANT
CONT SPEC 4OZ STRL OR WHT (MISCELLANEOUS)
COVER SURGICAL LIGHT HANDLE (MISCELLANEOUS) ×2 IMPLANT
DRAPE INCISE IOBAN 66X45 STRL (DRAPES) IMPLANT
DRAPE LAPAROSCOPIC ABDOMINAL (DRAPES) ×2 IMPLANT
DRAPE WARM FLUID 44X44 (DRAPES) IMPLANT
DRSG AQUACEL AG ADV 3.5X14 (GAUZE/BANDAGES/DRESSINGS) IMPLANT
DRSG VAC ATS LRG SENSATRAC (GAUZE/BANDAGES/DRESSINGS) ×2 IMPLANT
DRSG VAC ATS MED SENSATRAC (GAUZE/BANDAGES/DRESSINGS) IMPLANT
DRSG VAC ATS SM SENSATRAC (GAUZE/BANDAGES/DRESSINGS) IMPLANT
DRSG VERSA FOAM LRG 10X15 (GAUZE/BANDAGES/DRESSINGS) ×2 IMPLANT
ELECT REM PT RETURN 9FT ADLT (ELECTROSURGICAL) ×2
ELECTRODE REM PT RTRN 9FT ADLT (ELECTROSURGICAL) ×1 IMPLANT
GAUZE SPONGE 4X4 12PLY STRL (GAUZE/BANDAGES/DRESSINGS) IMPLANT
GAUZE SPONGE 4X4 12PLY STRL LF (GAUZE/BANDAGES/DRESSINGS) ×2 IMPLANT
GAUZE XEROFORM 5X9 LF (GAUZE/BANDAGES/DRESSINGS) IMPLANT
GLOVE NEODERM STRL 7.5  LF PF (GLOVE) ×1
GLOVE NEODERM STRL 7.5 LF PF (GLOVE) ×1 IMPLANT
GLOVE SURG NEODERM 7.5  LF PF (GLOVE) ×1
GOWN STRL REUS W/ TWL LRG LVL3 (GOWN DISPOSABLE) ×3 IMPLANT
GOWN STRL REUS W/TWL LRG LVL3 (GOWN DISPOSABLE) ×6
HANDPIECE INTERPULSE COAX TIP (DISPOSABLE) ×2
HEMOSTAT POWDER SURGIFOAM 1G (HEMOSTASIS) IMPLANT
HEMOSTAT SURGICEL 2X14 (HEMOSTASIS) IMPLANT
KIT BASIN OR (CUSTOM PROCEDURE TRAY) ×2 IMPLANT
KIT TURNOVER KIT B (KITS) ×2 IMPLANT
NS IRRIG 1000ML POUR BTL (IV SOLUTION) ×2 IMPLANT
PACK GENERAL/GYN (CUSTOM PROCEDURE TRAY) ×2 IMPLANT
PAD ARMBOARD 7.5X6 YLW CONV (MISCELLANEOUS) IMPLANT
SET HNDPC FAN SPRY TIP SCT (DISPOSABLE) ×1 IMPLANT
SOL PREP POV-IOD 4OZ 10% (MISCELLANEOUS) ×2 IMPLANT
SPONGE LAP 18X18 RF (DISPOSABLE) IMPLANT
STAPLER VISISTAT 35W (STAPLE) IMPLANT
SUT BONE WAX W31G (SUTURE) ×2 IMPLANT
SUT ETHILON 3 0 FSL (SUTURE) IMPLANT
SUT PDS AB 1 CTX 36 (SUTURE) IMPLANT
SUT PROLENE 2 0 MH 48 (SUTURE) IMPLANT
SUT VIC AB 2-0 CTX 27 (SUTURE) IMPLANT
SWAB COLLECTION DEVICE MRSA (MISCELLANEOUS) ×2 IMPLANT
SWAB CULTURE ESWAB REG 1ML (MISCELLANEOUS) ×2 IMPLANT
SYR 5ML LL (SYRINGE) IMPLANT
TAPE CLOTH SURG 4X10 WHT LF (GAUZE/BANDAGES/DRESSINGS) ×2 IMPLANT
TAPE PAPER 2X10 WHT MICROPORE (GAUZE/BANDAGES/DRESSINGS) ×2 IMPLANT
TOWEL GREEN STERILE (TOWEL DISPOSABLE) ×2 IMPLANT
TOWEL GREEN STERILE FF (TOWEL DISPOSABLE) ×2 IMPLANT
WATER STERILE IRR 1000ML POUR (IV SOLUTION) ×2 IMPLANT

## 2020-01-25 NOTE — Progress Notes (Signed)
5 Days Post-Op Procedure(s) (LRB): STERNAL CLOSURE (N/A) TRANSESOPHAGEAL ECHOCARDIOGRAM (TEE) (N/A) APPLICATION OF WOUND VAC (N/A) Subjective: sedated  Objective: Vital signs in last 24 hours: Temp:  [95.9 F (35.5 C)-99.86 F (37.7 C)] 97.34 F (36.3 C) (12/28 0800) Pulse Rate:  [83-123] 89 (12/28 0800) Cardiac Rhythm: Atrial fibrillation (12/28 0800) Resp:  [0-28] 28 (12/28 0800) BP: (86-105)/(59-71) 105/71 (12/28 0401) SpO2:  [94 %-100 %] 98 % (12/28 0800) Arterial Line BP: (80-113)/(48-75) 89/60 (12/28 0800) FiO2 (%):  [40 %] 40 % (12/28 0800) Weight:  [109.9 kg] 109.9 kg (12/28 0400)  Hemodynamic parameters for last 24 hours: PAP: (26-39)/(9-26) 30/18 CVP:  [0 mmHg-18 mmHg] 11 mmHg CO:  [5.6 L/min-6.6 L/min] 5.6 L/min CI:  [2.4 L/min/m2-2.8 L/min/m2] 2.4 L/min/m2  Intake/Output from previous day: 12/27 0701 - 12/28 0700 In: 4496.3 [I.V.:3569.7; NG/GT:422.3; IV Piggyback:299.9] Out: 4849 [Emesis/NG output:239; Chest Tube:140] Intake/Output this shift: Total I/O In: 179.6 [I.V.:140.5; Other:9.1; NG/GT:30] Out: 235 [Other:235]  General appearance: slowed mentation Neurologic: intact Heart: irregularly irregular rhythm Lungs: diminished breath sounds bilaterally Abdomen: soft, non-tender; bowel sounds normal; no masses,  no organomegaly Extremities: extremities normal, atraumatic, no cyanosis or edema Wound: intact dressing  Lab Results: Recent Labs    01/24/20 1700 01/24/20 1903 01/24/2020 0415 01/21/2020 0416  WBC 22.2*  --  21.3*  --   HGB 8.6*   < > 9.0* 9.2*  HCT 27.7*   < > 27.9* 27.0*  PLT 273  --  256  --    < > = values in this interval not displayed.   BMET:  Recent Labs    01/24/20 0245 01/24/20 0647 01/24/20 1600 01/24/20 1903 01/24/2020 0416  NA 135   < > 135 137 138  K 3.9   < > 3.6 4.0 3.8  CL 103  --  104  --   --   CO2 23  --  24  --   --   GLUCOSE 119*  --  124*  --   --   BUN 18  --  17  --   --   CREATININE 1.88*  --  1.89*  --    --   CALCIUM 8.1*  --  7.9*  --   --    < > = values in this interval not displayed.    PT/INR: No results for input(s): LABPROT, INR in the last 72 hours. ABG    Component Value Date/Time   PHART 7.409 01/07/2020 0416   HCO3 24.7 01/07/2020 0416   TCO2 26 01/13/2020 0416   ACIDBASEDEF 1.0 01/24/2020 1903   O2SAT 99.0 01/18/2020 0416   CBG (last 3)  Recent Labs    01/28/2020 0656 01/02/2020 0748 01/09/2020 0853  GLUCAP 128* 125* 129*    Assessment/Plan: S/P Procedure(s) (LRB): STERNAL CLOSURE (N/A) TRANSESOPHAGEAL ECHOCARDIOGRAM (TEE) (N/A) APPLICATION OF WOUND VAC (N/A) OR for sternal washout per routine care; possible wound vac placement to sternum   LOS: 26 days    [name] 12/30/2019

## 2020-01-25 NOTE — Brief Op Note (Signed)
01/10/2020  2:16 PM  PATIENT:  Corey Palmer  49 y.o. male  PRE-OPERATIVE DIAGNOSIS:  s/p CABG  POST-OPERATIVE DIAGNOSIS:  s/p CABG  PROCEDURE:  Procedure(s): STERNAL WASH OUT (N/A) APPLICATION OF WOUND VAC (N/A)  DC cardioversion  SURGEON:  Surgeon(s) and Role:    * [name], MD - Primary  PHYSICIAN ASSISTANT: n/a  ASSISTANTS: staff   ANESTHESIA:   general  EBL: minimal  BLOOD ADMINISTERED:none  DRAINS: 3 Chest Tube(s) in the mediastinum and bilateral pleural spaces   LOCAL MEDICATIONS USED:  NONE  SPECIMEN:  No Specimen  DISPOSITION OF SPECIMEN:  N/A  COUNTS:  YES  TOURNIQUET:  * No tourniquets in log *  DICTATION: .Note written in EPIC  PLAN OF CARE: Admit to inpatient   PATIENT DISPOSITION:  ICU - intubated and critically ill.   Delay start of Pharmacological VTE agent (>24hrs) due to surgical blood loss or risk of bleeding: yes

## 2020-01-25 NOTE — Progress Notes (Signed)
[place] for heparin Indication: Impella 5.5  Allergies  Allergen Reactions  . Orange Fruit Anaphylaxis, Hives and Other (See Comments)    Per Pt- Blisters around lips and Hives all over, also  . Penicillins Anaphylaxis    Did it involve swelling of the face/tongue/throat, SOB, or low BP? Yes Did it involve sudden or severe rash/hives, skin peeling, or any reaction on the inside of your mouth or nose? Yes Did you need to seek medical attention at a hospital or doctor's office? No When did it last happen?childhood If all above answers are "NO", may proceed with cephalosporin use.  [name] [Empagliflozin] Itching  . [name] [Insulin Glargine] Nausea And Vomiting    Patient Measurements: Height: 6\' 3"  (190.5 cm) Weight: 109.9 kg (242 lb 4.6 oz) IBW/kg (Calculated) : 84.5 Heparin dosing wt: 101 kg  Vital Signs: Temp: 97.34 F (36.3 C) (12/28 0800) Temp Source: Core (12/28 0800) BP: 105/71 (12/28 0401) Pulse Rate: 89 (12/28 0800)  Labs: Recent Labs    01/23/20 0415 01/23/20 0426 01/23/20 1754 01/24/20 0245 01/24/20 0647 01/24/20 1600 01/24/20 1700 01/24/20 1903 01/05/2020 0415 01/14/2020 0416  HGB 7.3*   < >  --  8.4*   < >  --  8.6* 8.2* 9.0* 9.2*  HCT 23.8*   < >  --  27.4*   < >  --  27.7* 24.0* 27.9* 27.0*  PLT 296  --   --  247  --   --  273  --  256  --   HEPARINUNFRC <0.10*  --   --  <0.10*  --   --   --   --  <0.10*  --   CREATININE 1.84*  --  1.90* 1.88*  --  1.89*  --   --   --   --    < > = values in this interval not displayed.    Estimated Creatinine Clearance: 61.9 mL/min (A) (by C-G formula based on SCr of 1.89 mg/dL (H)).   Assessment: 49 yo male s/p impella 5.5 placement. Pt started on heparinized purge and systemic heparin. Pt now s/p CABG 12/15 with Impella remaining in place c/b significant bleeding and hematoma requiring opened chest and reexploration in OR 12/16. Chest closed on 12/23,  re-opened on 12/24.  Impella on P7 with stable purge pressures. Heparin running via purge only at this time, purge flow rate 9.1 ml/hr (approx 455 units/hr heparin). Heparin level undetectable as expected, Hgb at 9.2, plt WNL. LDH stable at 349. No s/sx of bleeding per nursing.   Goal of Therapy:  Heparin level 0.2-0.5 units/ml Monitor platelets by anticoagulation protocol: Yes   Plan:  Continue heparin via the purge only  Will follow with CVTS regarding systemic heparin once appropriate to initiate Daily heparin level for now  [name], PharmD, [place] Pharmacist  Phone: [phone] 12/30/2019 8:37 AM  Please check AMION for all [place] phone numbers After 10:00 PM, call [place] [phone]

## 2020-01-25 NOTE — Plan of Care (Signed)
  Problem: Education: Goal: Ability to demonstrate management of disease process will improve Outcome: Progressing Goal: Ability to verbalize understanding of medication therapies will improve Outcome: Progressing Goal: Individualized Educational Video(s) Outcome: Progressing   Problem: Activity: Goal: Capacity to carry out activities will improve Outcome: Progressing   Problem: Cardiac: Goal: Ability to achieve and maintain adequate cardiopulmonary perfusion will improve Outcome: Progressing   Problem: Education: Goal: Ability to demonstrate management of disease process will improve Outcome: Progressing Goal: Ability to verbalize understanding of medication therapies will improve Outcome: Progressing   Problem: Activity: Goal: Capacity to carry out activities will improve Outcome: Progressing   Problem: Cardiac: Goal: Ability to achieve and maintain adequate cardiopulmonary perfusion will improve Outcome: Progressing   Problem: Education: Goal: Ability to verbalize understanding of medication therapies will improve Outcome: Progressing   Problem: Activity: Goal: Capacity to carry out activities will improve Outcome: Progressing   Problem: Cardiac: Goal: Ability to achieve and maintain adequate cardiopulmonary perfusion will improve Outcome: Progressing   Problem: Education: Goal: Knowledge of General Education information will improve Description: Including pain rating scale, medication(s)/side effects and non-pharmacologic comfort measures Outcome: Progressing   Problem: Health Behavior/Discharge Planning: Goal: Ability to manage health-related needs will improve Outcome: Progressing   Problem: Clinical Measurements: Goal: Ability to maintain clinical measurements within normal limits will improve Outcome: Progressing Goal: Will remain free from infection Outcome: Progressing Goal: Diagnostic test results will improve Outcome: Progressing Goal: Respiratory  complications will improve Outcome: Progressing Goal: Cardiovascular complication will be avoided Outcome: Progressing   Problem: Activity: Goal: Risk for activity intolerance will decrease Outcome: Progressing   Problem: Coping: Goal: Level of anxiety will decrease Outcome: Progressing   Problem: Skin Integrity: Goal: Risk for impaired skin integrity will decrease Outcome: Progressing   Problem: Education: Goal: Will demonstrate proper wound care and an understanding of methods to prevent future damage Outcome: Progressing Goal: Knowledge of disease or condition will improve Outcome: Progressing Goal: Knowledge of the prescribed therapeutic regimen will improve Outcome: Progressing Goal: Individualized Educational Video(s) Outcome: Progressing   Problem: Activity: Goal: Risk for activity intolerance will decrease Outcome: Progressing   Problem: Cardiac: Goal: Will achieve and/or maintain hemodynamic stability Outcome: Progressing   Problem: Clinical Measurements: Goal: Postoperative complications will be avoided or minimized Outcome: Progressing   Problem: Respiratory: Goal: Respiratory status will improve Outcome: Progressing   Problem: Skin Integrity: Goal: Wound healing without signs and symptoms of infection Outcome: Progressing Goal: Risk for impaired skin integrity will decrease Outcome: Progressing   Problem: Urinary Elimination: Goal: Ability to achieve and maintain adequate renal perfusion and functioning will improve Outcome: Progressing

## 2020-01-25 NOTE — Progress Notes (Signed)
[facility] BRIEF SICU PROGRESS NOTE  Day of Surgery  S/P Procedure(s) (LRB): STERNAL WASH OUT (N/A) APPLICATION OF WOUND VAC (N/A)   Stable since return from OR Maintaining NSR Remains on high-dose pressors, slowly weaning Impella flows stable Tolerating CRRT  Plan: Continue current plan  Rexene Alberts, MD 01/26/2020 6:45 PM

## 2020-01-25 NOTE — Progress Notes (Signed)
NAME:  Corey Palmer, MRN:  [id number], DOB:  1970/08/24, LOS: 28 ADMISSION DATE:  01/26/2020, CONSULTATION DATE: 01/10/2020 REFERRING MD: Aundra Dubin, CHIEF COMPLAINT: Respiratory failure post Impella  HPI/course in hospital  49 year old man admitted with A. fib/RVR, torsades, cardiogenic shock [EF 15] requiring Impella, CABG x5, AKI requiring initiation of CRRT   Past Medical History:    has a past medical history of Anginal pain ([place]), Anxiety, Arthritis, Automatic implantable cardioverter-defibrillator in situ (2007), Bipolar disorder ([place]), CAD (coronary artery disease), Cancer ([place]) (2013), CHF (congestive heart failure) ([place]), Chondrocalcinosis of right knee (9/67/5916), Chronic systolic heart failure ([place]), Depression, Diabetes uncomplicated adult-type II, Dilated cardiomyopathy ([place]), Dyslipidemia, Dysrhythmia, GERD (gastroesophageal reflux disease), Gout, unspecified, Hepatic steatosis (2011), History of kidney stones, echocardiogram, Hypertension, Obesity (BMI 30.0-34.9), Pacemaker, Paroxysmal atrial fibrillation ([place]), Peptic ulcer, Shortness of breath, and Sleep apnea.  Significant Hospital Events:  12/2 Afib with RVR. Cardioverted in ED. AICD did not fire. Milrinone for low co ox.  12/7 ICD fired, Torsades- likely from low K of 2.8.  12/8 Cath- multivessel disease w/ low CO. Volume overloaded.  12/11 Underwent Impella 5.5 insertion for persistent symptoms of forward failure with low cardiac indices. 12/12 Back to the OR for displacement of Impella. Extubated.  12/15 CABG x4, with LIMA-LAD, SVG-PDA/PLV, SVG-OM 12/16 Emergently opened chest at bedside for tamponade, clot compressing right atrium  12/16 To OR for Exploration of chest due to bleeding  12/19 Started CRRT 12/23 Sternal Closure in OR 12/24 Worsening shock.  Chest reopened at bedside with improvement in hemodynamics.  No mediastinal hematoma. 12/25 A flutter > rapid a paced to sinus rhythm.  Remains on high-dose pressors,  inotropes  Consults:  PCCM, nephrology, cardiology, cardiothoracic surgery  Procedures:    Significant Diagnostic Tests:    Micro Data:  Blood culture 12/14 > no growth Blood culture 12/16 > no growth Respiratory culture 12/20 > no growth Sternal wound culture 12/23 >  Antimicrobials:  Vancomycin 12/16 >> Meropenem 12/16 >>  Interval history:   Weaning vasopressor requirements.  For chest washout today.  Back in atrial fibrillation, refractory to increased amiodarone and attempted overdrive pacing.  Tolerating fluid removal via CRRT.  Objective   Blood pressure 105/71, pulse 88, temperature (!) 97.52 F (36.4 C), resp. rate (!) 28, height 6\' 3"  (1.905 m), weight 109.9 kg, SpO2 99 %. PAP: (26-39)/(9-26) 30/20 CVP:  [0 mmHg-18 mmHg] 10 mmHg CO:  [5.6 L/min-6.6 L/min] 5.6 L/min CI:  [2.4 L/min/m2-2.8 L/min/m2] 2.4 L/min/m2  Vent Mode: PRVC FiO2 (%):  [40 %] 40 % Set Rate:  [28 bmp] 28 bmp Vt Set:  [520 mL] 520 mL PEEP:  [5 cmH20] 5 cmH20 Plateau Pressure:  [21 cmH20-25 cmH20] 21 cmH20   Intake/Output Summary (Last 24 hours) at 01/24/2020 1006 Last data filed at 01/24/2020 0900 Gross per 24 hour  Intake 4457.99 ml  Output 4807 ml  Net -349.01 ml   Filed Weights   01/23/20 0500 01/24/20 0615 12/31/2019 0400  Weight: 109 kg 109.6 kg 109.9 kg   PAP: (26-39)/(9-26) 30/20 CVP:  [0 mmHg-18 mmHg] 10 mmHg CO:  [5.6 L/min-6.6 L/min] 5.6 L/min CI:  [2.4 L/min/m2-2.8 L/min/m2] 2.4 L/min/m2  Examination: Gen:      Intubated, sedated.  HEENT:  EOMI, sclera anicteric Neck:     ETT and OGT in place.  Lungs:    Clear to auscultation bilaterally; normal respiratory effort CV:         Heart sounds distant.  No rub Chest:  Open  chest, minimal chest tube drainage.  Abd:      + bowel sounds; soft, non-tender; no palpable masses, no distension Ext: Dependent edema; adequate peripheral perfusion Skin:      Warm and dry; no rash, line sites are intact. Neuro:  Sedated, will  occasionally follow commands weakly.  Will wiggle toes.   Assessment & Plan:  Critically ill due to Acute hypoxic respiratory failure -Continue vent support.  Unable to wean due to hemodynamic instability -Keep well sedated -Tracheostomy concurrently with chest closure  Critically ill due to Cardiogenic shock due to acute on chronic systolic heart failure requiring mechanical cardiac support with Impala and titration of inotropes Severe coronary artery disease status post CABG, complicated by cardiac tamponade, post hematoma removal in OR x 2 -Wean vasopressors as tolerated to keep MAP >65 -Continue epinephrine, milrinone and nitric oxide for RV support at current levels until chest closed, will wean down partly to help relieve atrial fibrillation. -Continue Impella at P7 -For washout today  Critically ill due to Afib with hemodynamic instability - Continue Amio IV -Colchicine to decrease pericardial inflammation that may be driving atrial fibrillation..     Bipolar affective Diabetes type 2 with hyperglycemia - Insulin infusion  Acute kidney injury-improving cardiorenal syndrome  Acute blood loss anemia -Remains on CRRT -On empiric antibiotics -Following BMP, electrolytes -Following CBC, goal hemoglobin 8.0   Daily Goals Checklist  Pain/Anxiety/Delirium protocol (if indicated): fentanyl and versed infusion to RASS-3, lighten once chest closed.  VAP protocol (if indicated): bundle in place. Respiratory support goals: full ventilatory support. Wean once chest closed.  Blood pressure target: MAP>65 DVT prophylaxis:  Heparin via CRRT circuit. Nutrition Status: high nutritional risk. - resume tube feeds, Cortrak. GI prophylaxis: pantoprazole Fluid status goals: CRRT to net UF -50 to -188ml/h - achieve net negative fluid balance to facilitate chest closure later this week. Remains edematous despite improved CVP Urinary catheter: not required due to anuria. periodic bladder  scan. Central lines: PICC, PAC, HD catheter. Glucose control: on insulin infusion, transition once chest closed.  Mobility/therapy needs: bed rest  Antibiotic de-escalation: continue current antibiotics until chest closed. Home medication reconciliation: home antidepressants re-ordered. Daily labs: CBC, renal function, cooximetry Code Status: full  Family Communication: wife updated at the bedside Disposition: ICU   Critical care time:   CRITICAL CARE Performed by: Kipp Brood   Total critical care time: 40 minutes  Critical care time was exclusive of separately billable procedures and treating other patients.  Critical care was necessary to treat or prevent imminent or life-threatening deterioration.  Critical care was time spent personally by me on the following activities: development of treatment plan with patient and/or surrogate as well as nursing, discussions with consultants, evaluation of patient's response to treatment, examination of patient, obtaining history from patient or surrogate, ordering and performing treatments and interventions, ordering and review of laboratory studies, ordering and review of radiographic studies, pulse oximetry, re-evaluation of patient's condition and participation in multidisciplinary rounds.  Kipp Brood, MD [facility] ICU Physician [place]  Pager: [phone] Mobile: [phone] After hours: [phone].   01/14/2020, 10:06 AM

## 2020-01-25 NOTE — Progress Notes (Signed)
*  PRELIMINARY RESULTS* Echocardiogram 2D Echocardiogram limited impella has been performed.  Leavy Cella 12/29/2019, 9:51 AM

## 2020-01-25 NOTE — Addendum Note (Signed)
Addendum  created 01/22/2020 1320 by Imagene Riches, CRNA   Intraprocedure Event edited

## 2020-01-25 NOTE — Progress Notes (Signed)
Patient ID: Corey Palmer, male   DOB: 05-20-1970, 49 y.o.   MRN: [id number]     Advanced Heart Failure Rounding Note  PCP-Cardiologist: No primary care provider on file.   Subjective:    - 12/7 Torsades -ICD shock x1.  - 12/11 Impella 5.5 placed.  - 12/12 Impella repositioned in OR - 12/15 CABG x 4 with LIMA-LAD, SVG-PDA/PLV, SVG-OM - 12/16 DCCV afib.  Back to OR to re-open chest and again to evacuate hematoma.  - 12/19 CVVH initiated  - 12/23 Chest closed - 12/24 Chest reopened - 12/26 RAP back into NSR - 12/27 back in atrial fibrillation, unable to rapid atrial pace out.    On NE 22, Epi 10, VP 0.04, milrinone 0.25, iNO 21 and Impella. Co-ox 64%   Keeping even to net 50 cc negative on CVVHD. CVP 11-12 today. Weight up 1 lb.   Tube feeds restarted.   Remains on vancomycin/meropenem.   Swan numbers: CVP 11-12 PA 32/20 CI 2.4 Co-ox 64%  Impella 5.5 P-7 Flow 4  Heparin only in purge Waveforms ok. No alarms LDH 357 => 349  Echo (12/10): EF <20%, moderate LV dilation, normal RV size with mildly decreased systolic function.    Objective:   Weight Range: 109.9 kg Body mass index is 30.28 kg/m.   Vital Signs:   Temp:  [95.9 F (35.5 C)-99.86 F (37.7 C)] 96.62 F (35.9 C) (12/28 0600) Pulse Rate:  [83-122] 98 (12/28 0600) Resp:  [0-28] 28 (12/28 0600) BP: (86-105)/(56-71) 105/71 (12/28 0401) SpO2:  [94 %-100 %] 98 % (12/28 0600) Arterial Line BP: (80-113)/(48-75) 96/62 (12/28 0600) FiO2 (%):  [40 %] 40 % (12/28 0401) Weight:  [109.9 kg] 109.9 kg (12/28 0400) Last BM Date: 01/24/20  Weight change: Filed Weights   01/23/20 0500 01/24/20 0615 01/17/2020 0400  Weight: 109 kg 109.6 kg 109.9 kg    Intake/Output:   Intake/Output Summary (Last 24 hours) at 01/23/2020 0719 Last data filed at 01/04/2020 0700 Gross per 24 hour  Intake 4496.28 ml  Output 4849 ml  Net -352.72 ml      Physical Exam  General: Intubated/sedated Neck: JVP 10 cm, no thyromegaly  or thyroid nodule.  Lungs: Decreased at bases. CV: Nondisplaced PMI.  Heart mildly tachy, irregular S1/S2, no S3/S4, no murmur.  Trace ankle edema.   Abdomen: Soft, nontender, no hepatosplenomegaly, no distention.  Skin: Intact without lesions or rashes.  Neurologic: Sedated on vent. Extremities: No clubbing or cyanosis.  HEENT: Normal.   Telemetry  Atrial fibrillation 100s-110s (personally reviewed)   Labs    CBC Recent Labs    01/24/20 0245 01/24/20 0647 01/24/20 1700 01/24/20 1903 01/18/2020 0415 01/21/2020 0416  WBC 22.0*  --  22.2*  --  21.3*  --   NEUTROABS 19.3*  --   --   --  18.7*  --   HGB 8.4*   < > 8.6*   < > 9.0* 9.2*  HCT 27.4*   < > 27.7*   < > 27.9* 27.0*  MCV 92.6  --  92.0  --  90.9  --   PLT 247  --  273  --  256  --    < > = values in this interval not displayed.   Basic Metabolic Panel Recent Labs    01/24/20 0245 01/24/20 0647 01/24/20 1600 01/24/20 1903 01/23/2020 0415 01/14/2020 0416  NA 135   < > 135 137  --  138  K 3.9   < >  3.6 4.0  --  3.8  CL 103  --  104  --   --   --   CO2 23  --  24  --   --   --   GLUCOSE 119*  --  124*  --   --   --   BUN 18  --  17  --   --   --   CREATININE 1.88*  --  1.89*  --   --   --   CALCIUM 8.1*  --  7.9*  --   --   --   MG 2.5*  --  2.5*  --  2.4  --   PHOS 2.2*  --  2.9  --   --   --    < > = values in this interval not displayed.   Liver Function Tests Recent Labs    01/24/20 0245 01/24/20 1600  ALBUMIN 1.8* 1.8*   No results for input(s): LIPASE, AMYLASE in the last 72 hours. Cardiac Enzymes No results for input(s): CKTOTAL, CKMB, CKMBINDEX, TROPONINI in the last 72 hours.  BNP: BNP (last 3 results) Recent Labs    01/24/2020 1619  BNP 577.5*    ProBNP (last 3 results) No results for input(s): PROBNP in the last 8760 hours.   D-Dimer No results for input(s): DDIMER in the last 72 hours. Hemoglobin A1C No results for input(s): HGBA1C in the last 72 hours. Fasting Lipid Panel No results  for input(s): CHOL, HDL, LDLCALC, TRIG, CHOLHDL, LDLDIRECT in the last 72 hours. Thyroid Function Tests No results for input(s): TSH, T4TOTAL, T3FREE, THYROIDAB in the last 72 hours.  Invalid input(s): FREET3  Other results:   Imaging    DG Chest 1 View  Result Date: 01/24/2020 CLINICAL DATA:  Status post open heart surgery, respiratory failure EXAM: CHEST  1 VIEW COMPARISON:  01/24/2020 FINDINGS: Endotracheal tube seen at 3.3 cm above the carina. Nasoenteric feeding tube extends into the upper abdomen beyond the margin of the examination. Right internal jugular Swan-Ganz catheter is seen with its tip within the right pulmonary artery. Right subclavian left ventricular assist device overlies its expected position across the aortic root and left ventricular outflow tract. Bilateral chest tubes are in place. Lung volumes are small. Tiny left pleural effusion present. Mild to moderate perihilar interstitial pulmonary edema is present. No pneumothorax. Cardiac size is mildly enlarged, unchanged. Left subclavian pacemaker is unchanged with its lead extending beyond the inferior margin of the exam. IMPRESSION: Stable support lines and tubes. Bilateral chest tubes in place.  No pneumothorax. Moderate perihilar interstitial pulmonary edema. Stable pulmonary hypoinflation. Electronically Signed   By: Fidela Salisbury MD   On: 01/28/2020 06:05   DG Chest Port 1 View  Result Date: 01/24/2020 CLINICAL DATA:  Follow-up open heart surgery EXAM: PORTABLE CHEST 1 VIEW COMPARISON:  11/22/2019 FINDINGS: Endotracheal tube tip 2.5 cm above the carina. Orogastric or nasogastric tube enters the stomach. Bilateral chest tubes in place. Right internal jugular Swan-Ganz catheter with tip in the right main pulmonary artery. Left heart Impella device in place. Slightly less pulmonary edema than was seen yesterday. Mild atelectasis in the left lower lobe. No pneumothorax. IMPRESSION: Lines and tubes well positioned. Slightly  less pulmonary edema. No pneumothorax. Electronically Signed   By: Nelson Chimes M.D.   On: 01/24/2020 09:02   DG Abd Portable 1V  Result Date: 01/24/2020 CLINICAL DATA:  Feeding tube placement EXAM: PORTABLE ABDOMEN - 1 VIEW COMPARISON:  01/21/2020 FINDINGS: Feeding  tube tip in the region of the gastric antrum or duodenal bulb. Normal bowel gas pattern. Left chest tube noted. IMPRESSION: Feeding tube tip in the region of the gastric antrum or duodenal bulb. Electronically Signed   By: Franchot Gallo M.D.   On: 01/24/2020 11:38     Medications:     Scheduled Medications: . sodium chloride   Intravenous Once  . aspirin  324 mg Per Tube Daily  . atorvastatin  80 mg Per Tube Daily  . B-complex with vitamin C  1 tablet Per Tube Daily  . busPIRone  10 mg Per Tube TID  . chlorhexidine gluconate (MEDLINE KIT)  15 mL Mouth Rinse BID  . Chlorhexidine Gluconate Cloth  6 each Topical Daily  . darbepoetin (ARANESP) injection - NON-DIALYSIS  60 mcg Subcutaneous Q Sat-1800  . feeding supplement (PROSource TF)  45 mL Per Tube QID  . FLUoxetine  40 mg Per Tube BID  . mouth rinse  15 mL Mouth Rinse 10 times per day  . nystatin  5 mL Oral QID  . pantoprazole sodium  40 mg Per Tube Daily  . sodium chloride flush  10-40 mL Intracatheter Q12H  . sodium chloride flush  3 mL Intravenous Q12H  . valproic acid  250 mg Per Tube BID    Infusions: .  prismasol BGK 4/2.5 500 mL/hr at 01/11/2020 0314  .  prismasol BGK 4/2.5 200 mL/hr at 01/24/20 1530  . sodium chloride Stopped (12/31/2019 1646)  . sodium chloride Stopped (01/23/20 1100)  . sodium chloride Stopped (01/16/20 1032)  . amiodarone 60 mg/hr (01/04/2020 0246)  . epinephrine 10 mcg/min (01/11/2020 0059)  . feeding supplement (PIVOT 1.5 CAL) 30 mL/hr at 01/15/2020 0200  . fentaNYL infusion INTRAVENOUS 175 mcg/hr (01/24/20 2003)  . impella catheter heparin 50 unit/mL in dextrose 5% 9 mL/hr at 01/21/20 0015  . insulin 1.1 mL/hr at 01/24/20 1900  . lactated  ringers 10 mL/hr at 01/24/20 1935  . meropenem (MERREM) IV 1 g (01/04/2020 0208)  . midazolam 2 mg/hr (12/31/2019 0532)  . milrinone 0.25 mcg/kg/min (01/21/2020 0616)  . norepinephrine (LEVOPHED) Adult infusion 25 mcg/min (01/21/2020 0248)  . prismasol BGK 4/2.5 1,500 mL/hr at 01/04/2020 0553  . vancomycin Stopped (01/24/20 1306)  . vasopressin 0.04 Units/min (01/04/2020 0027)    PRN Medications: sodium chloride, sodium chloride, bisacodyl **OR** bisacodyl, dextrose, docusate, fentaNYL, fentaNYL (SUBLIMAZE) injection, fentaNYL (SUBLIMAZE) injection, heparin, heparin, metoprolol tartrate, midazolam, midazolam, midazolam, ondansetron (ZOFRAN) IV, oxyCODONE, polyethylene glycol, sodium chloride flush, sodium chloride flush     Assessment/Plan   1. Cardiogenic shock/acute on chronic systolic CHF: Initially nonischemic cardiomyopathy. Possible familial cardiomyopathy as both parents had cardiomyopathy and died at around 40.However, [employer] gene testing did not show any common mutation for cardiomyopathy.  However, this admission noted to have severe 3 vessel disease so suspect component of ischemic cardiomyopathy. [employer] ICD. Echo in 8/20 with EF 15% and mildly decreased RV function. Echo this admission with EF < 20%, moderate LV dilation, RV mildly reduced, severe LAE, no significant MR.Low output HF with markedly low EF.  He has a long history of cardiomyopathy (20 yrs), tends to minimize symptoms.  Cardiorenal syndrome with creatinine up to 2.2,  stabilized on milrinone and Impella 5.5. SCr 1.44 day of CABG. CABG 12/15.  Post-op shock, back to OR 12/16 to open chest (chest wall compartment syndrome) and again to evacuate hematoma.  TEE post-op with severe RV dysfunction.  CVVHD initiated 12/19 for volume removal in setting of  rising Scr/decreased UOP. Suspect ATN. S/p chest closure 12/23. Hemodynamics much worse on 12/24 with rising pressor demands. Co-ox 36%.  Bedside echo severe biventricular dysfunction  with marked septal bounce. Chest re-opened 12/24.  Was atrial paced back to NSR on 12/26 with improvement in hemodynamics but back in atrial fibrillation on 12/27 and unable to pace out.  Remains on high-dose pressors (NE 22, Epi 10, VP 0.04, milrinone 0.25) and NO 21 ppm.  MAP stable.  Good cardiac output this morning.  CVP up to 11-12.  - Wean pressors slowly today for MAP 65.  - Decrease milrinone to 0.125.  - Aim for CVVH net negative 50-100 cc/hr today.  Need to close chest when volume status optimized.  - Continue Impella at current level (P-7). Check position by echo.  LDH stable.  2. Atrial fibrillation: H/o PAF.  He was on dofetilide in the remote past but this was stopped due to noncompliance. He had an upper GI bleed from antral ulcers in 3/12. He was seen by GI and was deemed safe to restart anticoagulation as long as he remains on a PPI.  No apparent recurrence of AF until just prior to this admission, was cardioverted back to NSR in ER but went back into atrial fibrillation.  Post-op afib, DCCV to NSR/BiV pacing am 12/16 -> back to AF. Rapid atrial pacing back to SR on 12/26 -> back in AF on 12/27 and unable to pace out.  - Continue IV amio - Off heparin post-op with bleeding, only getting heparin in Impella purge currently. Unable to restart [facility] currently with high risk of bleeding so will not cardiovert (also unlikely to hold with pressor burden).  - Did not have Maze with CABG.    3. CAD:   Cath this admission with severe 3 vessel disease. CABG x 4 on 12/15.  - Continue atorvastatin, ASA.  - continue current management 4. Acute on chronic hypoxemic respiratory failure: Vent per CCM.  - CCM following - Will need tracheostomy when chest closed.  5. AKI on CKD stage 3: Likely ATN/cardiorenal.  On CVVH.  - Aim for net negative 50-100 cc/hr today.  6. Diabetes: Insulin.  7. Hyponatremia: Resolved.  8. Torsades/ICD shock: 12/7/212 in hospital event, in setting of severe hypokalemia and  hypomagnesemia.   9. Gout: h/o gout. Complained of rt knee pain c/w previous flares - treated w/ prednisone burst (completed).  10: ID: Treating for PNA.  WBC trending down.  - On vancomycin/meropenem (PCN allergy).  - can add fungal coverage as needed 11. FEN: TFs restarted.    CRITICAL CARE Performed by: Loralie Champagne  Total critical care time: 40 minutes  Critical care time was exclusive of separately billable procedures and treating other patients.  Critical care was necessary to treat or prevent imminent or life-threatening deterioration.  Critical care was time spent personally by me on the following activities: development of treatment plan with patient and/or surrogate as well as nursing, discussions with consultants, evaluation of patient's response to treatment, examination of patient, obtaining history from patient or surrogate, ordering and performing treatments and interventions, ordering and review of laboratory studies, ordering and review of radiographic studies, pulse oximetry and re-evaluation of patient's condition.  Loralie Champagne 01/17/2020 7:19 AM

## 2020-01-25 NOTE — Progress Notes (Signed)
[facility] NEPHROLOGY PROGRESS NOTE  Assessment/ Plan:  #AKI/CKD stage III- presumably due to ischemic ATN in setting of cardiogenic shock s/p CABG.  CRRT started 01/16/20. All fluids 4K/2.5Ca: Pre-filter 500 ml/hr, post filter 200 ml/hr, dialysate 1500 ml/hr. No heparin due to hematoma. Right femoral HD catheter placed 01/16/20. -Tolerating CRRT well, no problem with filter.  CVP elevated with peripheral edema therefore we will increase UF goal today.  Discussed with ICU nurse and cardiology.  # CAD s/p CABG complicated by cardiogenic shock and post-op hematoma. secondary closure on 12/23 then had to be re opened.  # Cardiogenic shock- underlying familial cardiomyopathy s/p redo CABG. Remains on pressors: Levophed, epinephrine, and vasopressin. Impella and milrinone.   # ABLA- s/p postoperative bleeding into chest cavity s/p re-exploration for tamponade/hematoma. CT surgery following.stable, added ESA.  #Hypophosphatemia- due to CRRT. Repleted K phos.  # Atrial fibrillation - s/p DCCV on 12/16.  On amiodarone.  # VDRF- per primary svc.  #-Leukocytosis.On vancomycin and meropenem.   Per primary team.  Subjective: Seen and examined.  Tolerating CRRT well.  Remains on ventilator and multiple pressors.  No new event.. Objective Vital signs in last 24 hours: Vitals:   01/02/2020 0500 01/26/2020 0600 12/30/2019 0700 01/07/2020 0800  BP:      Pulse: (!) 101 98 83 89  Resp: (!) 0 (!) 28 (!) 28 (!) 28  Temp: (!) 96.08 F (35.6 C) (!) 96.62 F (35.9 C) (!) 96.8 F (36 C) (!) 97.34 F (36.3 C)  TempSrc:    Core  SpO2: 97% 98% 98% 98%  Weight:      Height:       Weight change: 0.3 kg  Intake/Output Summary (Last 24 hours) at 01/01/2020 0823 Last data filed at 01/02/2020 0800 Gross per 24 hour  Intake 4534.47 ml  Output 4679 ml  Net -144.53 ml       Labs: Basic Metabolic Panel: Recent Labs  Lab 01/23/20 1754 01/24/20 0245 01/24/20 0647 01/24/20 1600  01/24/20 1903 01/27/2020 0416  NA 132* 135   < > 135 137 138  K 3.8 3.9   < > 3.6 4.0 3.8  CL 103 103  --  104  --   --   CO2 21* 23  --  24  --   --   GLUCOSE 110* 119*  --  124*  --   --   BUN 19 18  --  17  --   --   CREATININE 1.90* 1.88*  --  1.89*  --   --   CALCIUM 7.7* 8.1*  --  7.9*  --   --   PHOS 2.1* 2.2*  --  2.9  --   --    < > = values in this interval not displayed.   Liver Function Tests: Recent Labs  Lab 01/17/2020 1652 01/21/20 0410 01/21/20 1551 01/22/20 0319 01/22/20 1546 01/23/20 1754 01/24/20 0245 01/24/20 1600  AST 42* 39  --  36  --   --   --   --   ALT 76* 62*  --  45*  --   --   --   --   ALKPHOS 162* 134*  --  122  --   --   --   --   BILITOT 1.2 1.3*  --  1.1  --   --   --   --   PROT 6.0* 6.2*  --  5.5*  --   --   --   --  ALBUMIN 2.1* 2.3*   < > 2.0*   < > 1.8* 1.8* 1.8*   < > = values in this interval not displayed.   No results for input(s): LIPASE, AMYLASE in the last 168 hours. No results for input(s): AMMONIA in the last 168 hours. CBC: Recent Labs  Lab 01/22/20 1604 01/22/20 2340 01/23/20 0415 01/23/20 0426 01/24/20 0245 01/24/20 0647 01/24/20 1700 01/24/20 1903 01/01/2020 0415 01/08/2020 0416  WBC 31.9*  --  27.8*  --  22.0*  --  22.2*  --  21.3*  --   NEUTROABS  --   --  24.1*  --  19.3*  --   --   --  18.7*  --   HGB 7.8*   < > 7.3*   < > 8.4*   < > 8.6* 8.2* 9.0* 9.2*  HCT 25.3*   < > 23.8*   < > 27.4*   < > 27.7* 24.0* 27.9* 27.0*  MCV 94.4  --  93.3  --  92.6  --  92.0  --  90.9  --   PLT 313  --  296  --  247  --  273  --  256  --    < > = values in this interval not displayed.   Cardiac Enzymes: No results for input(s): CKTOTAL, CKMB, CKMBINDEX, TROPONINI in the last 168 hours. CBG: Recent Labs  Lab 01/24/2020 0241 12/30/2019 0414 01/06/2020 0508 01/26/2020 0656 01/17/2020 0748  GLUCAP 138* 129* 125* 128* 125*    Iron Studies: No results for input(s): IRON, TIBC, TRANSFERRIN, FERRITIN in the last 72  hours. Studies/Results: DG Chest 1 View  Result Date: 01/16/2020 CLINICAL DATA:  Status post open heart surgery, respiratory failure EXAM: CHEST  1 VIEW COMPARISON:  01/24/2020 FINDINGS: Endotracheal tube seen at 3.3 cm above the carina. Nasoenteric feeding tube extends into the upper abdomen beyond the margin of the examination. Right internal jugular Swan-Ganz catheter is seen with its tip within the right pulmonary artery. Right subclavian left ventricular assist device overlies its expected position across the aortic root and left ventricular outflow tract. Bilateral chest tubes are in place. Lung volumes are small. Tiny left pleural effusion present. Mild to moderate perihilar interstitial pulmonary edema is present. No pneumothorax. Cardiac size is mildly enlarged, unchanged. Left subclavian pacemaker is unchanged with its lead extending beyond the inferior margin of the exam. IMPRESSION: Stable support lines and tubes. Bilateral chest tubes in place.  No pneumothorax. Moderate perihilar interstitial pulmonary edema. Stable pulmonary hypoinflation. Electronically Signed   By: Fidela Salisbury MD   On: 01/22/2020 06:05   DG Chest Port 1 View  Result Date: 01/24/2020 CLINICAL DATA:  Follow-up open heart surgery EXAM: PORTABLE CHEST 1 VIEW COMPARISON:  11/22/2019 FINDINGS: Endotracheal tube tip 2.5 cm above the carina. Orogastric or nasogastric tube enters the stomach. Bilateral chest tubes in place. Right internal jugular Swan-Ganz catheter with tip in the right main pulmonary artery. Left heart Impella device in place. Slightly less pulmonary edema than was seen yesterday. Mild atelectasis in the left lower lobe. No pneumothorax. IMPRESSION: Lines and tubes well positioned. Slightly less pulmonary edema. No pneumothorax. Electronically Signed   By: Nelson Chimes M.D.   On: 01/24/2020 09:02   DG Abd Portable 1V  Result Date: 01/24/2020 CLINICAL DATA:  Feeding tube placement EXAM: PORTABLE ABDOMEN - 1  VIEW COMPARISON:  01/21/2020 FINDINGS: Feeding tube tip in the region of the gastric antrum or duodenal bulb. Normal bowel gas pattern. Left  chest tube noted. IMPRESSION: Feeding tube tip in the region of the gastric antrum or duodenal bulb. Electronically Signed   By: Franchot Gallo M.D.   On: 01/24/2020 11:38    Medications: Infusions: .  prismasol BGK 4/2.5 500 mL/hr at 01/16/2020 0314  .  prismasol BGK 4/2.5 200 mL/hr at 01/24/20 1530  . sodium chloride Stopped (01/10/2020 1646)  . sodium chloride Stopped (01/23/20 1100)  . sodium chloride Stopped (01/16/20 1032)  . amiodarone 60 mg/hr (01/02/2020 0800)  . epinephrine 10 mcg/min (01/21/2020 0800)  . feeding supplement (PIVOT 1.5 CAL) 30 mL/hr at 01/19/2020 0200  . fentaNYL infusion INTRAVENOUS 175 mcg/hr (01/17/2020 0800)  . impella catheter heparin 50 unit/mL in dextrose 5% 9 mL/hr at 01/21/20 0015  . insulin 1.5 mL/hr at 01/28/2020 0800  . lactated ringers 10 mL/hr at 01/19/2020 0800  . meropenem (MERREM) IV 1 g (01/15/2020 0208)  . midazolam 2 mg/hr (01/19/2020 0800)  . milrinone 0.125 mcg/kg/min (01/14/2020 0800)  . norepinephrine (LEVOPHED) Adult infusion 22 mcg/min (01/24/2020 0800)  . prismasol BGK 4/2.5 1,500 mL/hr at 12/30/2019 0553  . vancomycin Stopped (01/24/20 1306)  . vasopressin 0.04 Units/min (01/19/2020 0800)    Scheduled Medications: . sodium chloride   Intravenous Once  . aspirin  324 mg Per Tube Daily  . atorvastatin  80 mg Per Tube Daily  . B-complex with vitamin C  1 tablet Per Tube Daily  . busPIRone  10 mg Per Tube TID  . chlorhexidine gluconate (MEDLINE KIT)  15 mL Mouth Rinse BID  . Chlorhexidine Gluconate Cloth  6 each Topical Daily  . darbepoetin (ARANESP) injection - NON-DIALYSIS  60 mcg Subcutaneous Q Sat-1800  . feeding supplement (PROSource TF)  45 mL Per Tube QID  . FLUoxetine  40 mg Per Tube BID  . mouth rinse  15 mL Mouth Rinse 10 times per day  . nystatin  5 mL Oral QID  . pantoprazole sodium  40 mg Per Tube Daily   . sodium chloride flush  10-40 mL Intracatheter Q12H  . sodium chloride flush  3 mL Intravenous Q12H  . valproic acid  250 mg Per Tube BID    have reviewed scheduled and prn medications.  Physical Exam: General: Critically ill male intubated and sedated. Heart:RRR, s1s2 nl Lungs open external wound.  Coarse breath sound. Abdomen:soft, non-distended Extremities: Bilateral lower extremity edema present, unchanged Dialysis Access: Right femoral HD catheter.  Tocara Mennen Prasad Daishia Fetterly 01/07/2020,8:23 AM  LOS: 26 days  Pager: [phone]

## 2020-01-25 NOTE — Anesthesia Preprocedure Evaluation (Addendum)
Anesthesia Evaluation  Patient identified by MRN, date of birth, ID band Patient unresponsive    Reviewed: Allergy & Precautions, NPO status , Patient's Chart, lab work & pertinent test results, Unable to perform ROS - Chart review only  History of Anesthesia Complications Negative for: history of anesthetic complications  Airway Mallampati: Intubated       Dental   Pulmonary sleep apnea ,   Intubated     + decreased breath sounds  + intubated    Cardiovascular hypertension, Pt. on home beta blockers and Pt. on medications + CAD, + CABG and +CHF  + dysrhythmias Atrial Fibrillation + pacemaker + Cardiac Defibrillator  Rhythm:Irregular Rate:Tachycardia   '21 TTE - RV size appears normal but there is severely reduced RV function. Severe RV dysfunction was present on the prior study. Impella present in the LV, 5.6 cm from the AoV. The LV is dilated with septal bowing into the RV. Trace MR. Left ventricular ejection fraction, by estimation, is 10-15%. Global hypokinesis. The left ventricular internal cavity size was moderately dilated. Left atrial size was mildly dilated. Trivial mitral valve regurgitation.     Neuro/Psych PSYCHIATRIC DISORDERS Anxiety Depression Bipolar Disorder    GI/Hepatic PUD, GERD  Medicated,  Endo/Other  diabetes, Type 2, Insulin Dependent Hypocalcemia Obesity   Renal/GU Renal InsufficiencyRenal disease     Musculoskeletal  (+) Arthritis ,  Gout    Abdominal   Peds  Hematology  (+) anemia ,   Anesthesia Other Findings Covid test negative 01/05/2020 (inpatient since)    Reproductive/Obstetrics                            Anesthesia Physical Anesthesia Plan  ASA: IV  Anesthesia Plan: General   Post-op Pain Management:    Induction: Inhalational  PONV Risk Score and Plan: 2 and Treatment may vary due to age or medical condition  Airway Management Planned: Oral  ETT  Additional Equipment:   Intra-op Plan:   Post-operative Plan: Post-operative intubation/ventilation  Informed Consent:     History available from chart only  Plan Discussed with: CRNA and Anesthesiologist  Anesthesia Plan Comments:        Anesthesia Quick Evaluation

## 2020-01-25 NOTE — Anesthesia Postprocedure Evaluation (Signed)
Anesthesia Post Note  Patient: Corey Palmer  Procedure(s) Performed: STERNAL [place] (N/A ) APPLICATION OF WOUND VAC (N/A )     Patient location during evaluation: ICU Anesthesia Type: General Level of consciousness: sedated and patient remains intubated per anesthesia plan Pain management: pain level controlled Respiratory status: patient remains intubated per anesthesia plan Cardiovascular status: On multiple vasopressors to maintain reasonable MAP, as in preop. Postop Assessment: no apparent nausea or vomiting Anesthetic complications: no   No complications documented.  Last Vitals:  Vitals:   01/24/2020 0900 01/02/2020 1000  BP:    Pulse: 88 (!) 107  Resp: (!) 28 (!) 28  Temp: (!) 36.4 C 36.6 C  SpO2: 99% 99%    Last Pain:  Vitals:   01/19/2020 0800  TempSrc: Core  PainSc:                  Audry Pili

## 2020-01-25 NOTE — H&P (Signed)
History and Physical Interval Note:  01/05/2020 8:56 AM  Corey Palmer  has presented today for surgery, with the diagnosis of redo CABG open heart.  The various methods of treatment have been discussed with the patient and family. After consideration of risks, benefits and other options for treatment, the patient has consented to  PROCEDURE: sternal washout; possible wound vac placement as a surgical intervention.  [name] history has been reviewed, patient examined, no change in status, stable for surgery.  I have reviewed the patient's chart and labs.  Questions were answered to the patient's satisfaction.     [name]

## 2020-01-25 NOTE — Transfer of Care (Signed)
Immediate Anesthesia Transfer of Care Note  Patient: Corey Palmer  Procedure(s) Performed: STERNAL [place] (N/A ) APPLICATION OF WOUND VAC (N/A )  Patient Location: ICU  Anesthesia Type:General  Level of Consciousness: awake  Airway & Oxygen Therapy: Patient remains intubated per anesthesia plan and Patient placed on Ventilator (see vital sign flow sheet for setting)  Post-op Assessment: Report given to RN, Post -op Vital signs reviewed and stable and See ICU flow sheet for vital signs  Post vital signs: Reviewed and stable  Last Vitals:  Vitals Value Taken Time  BP    Temp    Pulse    Resp 28 01/28/2020 1235  SpO2    Vitals shown include unvalidated device data.  Last Pain:  Vitals:   01/24/2020 0800  TempSrc: Core  PainSc:       Patients Stated Pain Goal: 0 (28/76/81 1572)  Complications: No complications documented.

## 2020-01-26 ENCOUNTER — Encounter (HOSPITAL_COMMUNITY): Payer: Self-pay | Admitting: Cardiothoracic Surgery

## 2020-01-26 ENCOUNTER — Inpatient Hospital Stay (HOSPITAL_COMMUNITY): Payer: BC Managed Care – PPO

## 2020-01-26 DIAGNOSIS — R570 Cardiogenic shock: Secondary | ICD-10-CM | POA: Diagnosis not present

## 2020-01-26 DIAGNOSIS — I4891 Unspecified atrial fibrillation: Secondary | ICD-10-CM | POA: Diagnosis not present

## 2020-01-26 LAB — POCT I-STAT 7, (LYTES, BLD GAS, ICA,H+H)
Acid-Base Excess: 0 mmol/L (ref 0.0–2.0)
Acid-Base Excess: 0 mmol/L (ref 0.0–2.0)
Bicarbonate: 25.4 mmol/L (ref 20.0–28.0)
Bicarbonate: 25.4 mmol/L (ref 20.0–28.0)
Calcium, Ion: 1.19 mmol/L (ref 1.15–1.40)
Calcium, Ion: 1.16 mmol/L (ref 1.15–1.40)
HCT: 27 % — ABNORMAL LOW (ref 39.0–52.0)
HCT: 28 % — ABNORMAL LOW (ref 39.0–52.0)
Hemoglobin: 9.2 g/dL — ABNORMAL LOW (ref 13.0–17.0)
Hemoglobin: 9.5 g/dL — ABNORMAL LOW (ref 13.0–17.0)
O2 Saturation: 97 %
O2 Saturation: 98 %
Patient temperature: 36.9
Patient temperature: 98.3
Potassium: 3.8 mmol/L (ref 3.5–5.1)
Potassium: 4 mmol/L (ref 3.5–5.1)
Sodium: 138 mmol/L (ref 135–145)
Sodium: 137 mmol/L (ref 135–145)
TCO2: 27 mmol/L (ref 22–32)
TCO2: 27 mmol/L (ref 22–32)
pCO2 arterial: 42.4 mmHg (ref 32.0–48.0)
pCO2 arterial: 44.4 mmHg (ref 32.0–48.0)
pH, Arterial: 7.384 (ref 7.350–7.450)
pH, Arterial: 7.365 (ref 7.350–7.450)
pO2, Arterial: 93 mmHg (ref 83.0–108.0)
pO2, Arterial: 104 mmHg (ref 83.0–108.0)

## 2020-01-26 LAB — RENAL FUNCTION PANEL
Albumin: 1.8 g/dL — ABNORMAL LOW (ref 3.5–5.0)
Albumin: 1.8 g/dL — ABNORMAL LOW (ref 3.5–5.0)
Anion gap: 8 (ref 5–15)
Anion gap: 10 (ref 5–15)
BUN: 16 mg/dL (ref 6–20)
BUN: 19 mg/dL (ref 6–20)
CO2: 23 mmol/L (ref 22–32)
CO2: 23 mmol/L (ref 22–32)
Calcium: 8 mg/dL — ABNORMAL LOW (ref 8.9–10.3)
Calcium: 8 mg/dL — ABNORMAL LOW (ref 8.9–10.3)
Chloride: 104 mmol/L (ref 98–111)
Chloride: 103 mmol/L (ref 98–111)
Creatinine, Ser: 1.73 mg/dL — ABNORMAL HIGH (ref 0.61–1.24)
Creatinine, Ser: 1.7 mg/dL — ABNORMAL HIGH (ref 0.61–1.24)
GFR, Estimated: 47 mL/min — ABNORMAL LOW (ref >60)
GFR, Estimated: 48 mL/min — ABNORMAL LOW (ref >60)
Glucose, Bld: 152 mg/dL — ABNORMAL HIGH (ref 70–99)
Glucose, Bld: 133 mg/dL — ABNORMAL HIGH (ref 70–99)
Phosphorus: 2.1 mg/dL — ABNORMAL LOW (ref 2.5–4.6)
Phosphorus: 3.4 mg/dL (ref 2.5–4.6)
Potassium: 3.8 mmol/L (ref 3.5–5.1)
Potassium: 4 mmol/L (ref 3.5–5.1)
Sodium: 135 mmol/L (ref 135–145)
Sodium: 136 mmol/L (ref 135–145)

## 2020-01-26 LAB — CBC WITH DIFFERENTIAL/PLATELET
Abs Immature Granulocytes: 0.2 10*3/uL — ABNORMAL HIGH (ref 0.00–0.07)
Basophils Absolute: 0.1 10*3/uL (ref 0.0–0.1)
Basophils Relative: 0 %
Eosinophils Absolute: 0.3 10*3/uL (ref 0.0–0.5)
Eosinophils Relative: 2 %
HCT: 27.1 % — ABNORMAL LOW (ref 39.0–52.0)
Hemoglobin: 8.7 g/dL — ABNORMAL LOW (ref 13.0–17.0)
Immature Granulocytes: 1 %
Lymphocytes Relative: 4 %
Lymphs Abs: 0.8 10*3/uL (ref 0.7–4.0)
MCH: 29.1 pg (ref 26.0–34.0)
MCHC: 32.1 g/dL (ref 30.0–36.0)
MCV: 90.6 fL (ref 80.0–100.0)
Monocytes Absolute: 1.3 10*3/uL — ABNORMAL HIGH (ref 0.1–1.0)
Monocytes Relative: 7 %
Neutro Abs: 15.5 10*3/uL — ABNORMAL HIGH (ref 1.7–7.7)
Neutrophils Relative %: 86 %
Platelets: 262 10*3/uL (ref 150–400)
RBC: 2.99 MIL/uL — ABNORMAL LOW (ref 4.22–5.81)
RDW: 20.8 % — ABNORMAL HIGH (ref 11.5–15.5)
WBC: 18.1 10*3/uL — ABNORMAL HIGH (ref 4.0–10.5)
nRBC: 0.6 % — ABNORMAL HIGH (ref 0.0–0.2)

## 2020-01-26 LAB — GLUCOSE, CAPILLARY
Glucose-Capillary: 100 mg/dL — ABNORMAL HIGH (ref 70–99)
Glucose-Capillary: 124 mg/dL — ABNORMAL HIGH (ref 70–99)
Glucose-Capillary: 125 mg/dL — ABNORMAL HIGH (ref 70–99)
Glucose-Capillary: 126 mg/dL — ABNORMAL HIGH (ref 70–99)
Glucose-Capillary: 130 mg/dL — ABNORMAL HIGH (ref 70–99)
Glucose-Capillary: 134 mg/dL — ABNORMAL HIGH (ref 70–99)
Glucose-Capillary: 138 mg/dL — ABNORMAL HIGH (ref 70–99)
Glucose-Capillary: 139 mg/dL — ABNORMAL HIGH (ref 70–99)
Glucose-Capillary: 141 mg/dL — ABNORMAL HIGH (ref 70–99)
Glucose-Capillary: 141 mg/dL — ABNORMAL HIGH (ref 70–99)
Glucose-Capillary: 142 mg/dL — ABNORMAL HIGH (ref 70–99)
Glucose-Capillary: 144 mg/dL — ABNORMAL HIGH (ref 70–99)
Glucose-Capillary: 148 mg/dL — ABNORMAL HIGH (ref 70–99)
Glucose-Capillary: 150 mg/dL — ABNORMAL HIGH (ref 70–99)
Glucose-Capillary: 157 mg/dL — ABNORMAL HIGH (ref 70–99)
Glucose-Capillary: 161 mg/dL — ABNORMAL HIGH (ref 70–99)
Glucose-Capillary: 178 mg/dL — ABNORMAL HIGH (ref 70–99)

## 2020-01-26 LAB — ECHO INTRAOPERATIVE TEE
Height: 75 in
Weight: 3982.39 oz

## 2020-01-26 LAB — MAGNESIUM: Magnesium: 2.4 mg/dL (ref 1.7–2.4)

## 2020-01-26 LAB — COOXEMETRY PANEL
Carboxyhemoglobin: 1.4 % (ref 0.5–1.5)
Methemoglobin: 0.9 % (ref 0.0–1.5)
O2 Saturation: 59.3 %
Total hemoglobin: 10 g/dL — ABNORMAL LOW (ref 12.0–16.0)

## 2020-01-26 LAB — ACID FAST SMEAR (AFB, MYCOBACTERIA): Acid Fast Smear: NEGATIVE

## 2020-01-26 LAB — HEPARIN LEVEL (UNFRACTIONATED)
Heparin Unfractionated: <0.1 IU/mL — ABNORMAL LOW (ref 0.30–0.70)
Heparin Unfractionated: <0.1 IU/mL — ABNORMAL LOW (ref 0.30–0.70)

## 2020-01-26 LAB — LACTATE DEHYDROGENASE: LDH: 333 U/L — ABNORMAL HIGH (ref 98–192)

## 2020-01-26 MED ORDER — SODIUM CHLORIDE 0.9 % IV SOLN
INTRAVENOUS | Status: DC | PRN
  Administered 2020-01-26: 10:00:00 250 mL via INTRAVENOUS
  Administered 2020-02-18 – 2020-02-21 (×2): 1000 mL via INTRAVENOUS
  Administered 2020-02-24: 500 mL via INTRAVENOUS
  Administered 2020-02-24: 1000 mL via INTRAVENOUS
  Administered 2020-03-01: 500 mL via INTRAVENOUS

## 2020-01-26 MED ORDER — POTASSIUM PHOSPHATES 15 MMOLE/5ML IV SOLN
20.0000 mmol | Freq: Once | INTRAVENOUS | Status: AC
  Administered 2020-01-26: 10:00:00 20 mmol via INTRAVENOUS
  Filled 2020-01-26: qty 6.67

## 2020-01-26 MED ORDER — HEPARIN (PORCINE) 25000 UT/250ML-% IV SOLN
1000.0000 [IU]/h | INTRAVENOUS | Status: DC
  Administered 2020-01-26: 10:00:00 200 [IU]/h via INTRAVENOUS
  Administered 2020-01-28: 650 [IU]/h via INTRAVENOUS
  Administered 2020-01-31 – 2020-02-01 (×3): 1000 [IU]/h via INTRAVENOUS
  Filled 2020-01-26 (×6): qty 250

## 2020-01-26 MED ORDER — QUETIAPINE FUMARATE 25 MG PO TABS
25.0000 mg | ORAL_TABLET | Freq: Every day | ORAL | Status: DC
Start: 2020-01-26 — End: 2020-01-27
  Administered 2020-01-26: 22:00:00 25 mg via ORAL
  Filled 2020-01-26: qty 1

## 2020-01-26 NOTE — Progress Notes (Signed)
      [address]       [place] [address]             [phone]      POD # 1 sternal washout, POD # 14 CABG x 4  Intubated, sedated  BP (!) 85/57   Pulse 92   Temp 98.1 F (36.7 C)   Resp (!) 28   Ht 6\' 3"  (1.905 m)   Wt 109 kg   SpO2 100%   BMI 30.04 kg/m  CI= 2.2 on Impella p8, epi 5.5, milrinone 0.125, norepi 26  Intake/Output Summary (Last 24 hours) at 01/26/2020 1740 Last data filed at 01/26/2020 1600 Gross per 24 hour  Intake 4994.2 ml  Output 7200 ml  Net -2205.8 ml   K= 4.0, creatinine 1.7 Hct= 28  Remains critically ill  Remo Lipps C. Roxan Hockey, MD [facility] [phone]

## 2020-01-26 NOTE — Progress Notes (Signed)
Patient ID: Corey Palmer, male   DOB: 19-Jan-1971, 49 y.o.   MRN: [id number]     Advanced Heart Failure Rounding Note  PCP-Cardiologist: No primary care provider on file.   Subjective:    - 12/7 Torsades -ICD shock x1.  - 12/11 Impella 5.5 placed.  - 12/12 Impella repositioned in OR - 12/15 CABG x 4 with LIMA-LAD, SVG-PDA/PLV, SVG-OM - 12/16 DCCV afib.  Back to OR to re-open chest and again to evacuate hematoma.  - 12/19 CVVH initiated  - 12/23 Chest closed - 12/24 Chest reopened - 12/26 RAP back into NSR - 12/27 back in atrial fibrillation, unable to rapid atrial pace out.   - 12/28 To OR, chest partially closed and wound vac placed.  DCCV to NSR.   On NE 16, Epi 7, VP 0.04, milrinone 0.125, iNO 21 and Impella.    He is in NSR today on amiodarone 60 mg/hr.   CVVH running net 50-100 cc/hr negative.  Weight down 2 lbs.   Tube feeds ongoing.    Remains on vancomycin/meropenem with open chest.   Swan numbers: CVP 10-11 PA 22/14 CI 2.5 Co-ox 59%  Impella 5.5 P-7 Flow 3.9  Heparin only in purge Waveforms ok. No alarms LDH 357 => 349 => 333  Echo (12/10): EF <20%, moderate LV dilation, normal RV size with mildly decreased systolic function.    Objective:   Weight Range: 109 kg Body mass index is 30.04 kg/m.   Vital Signs:   Temp:  [97.16 F (36.2 C)-98.96 F (37.2 C)] 98.06 F (36.7 C) (12/29 0645) Pulse Rate:  [83-107] 86 (12/29 0645) Resp:  [2-28] 28 (12/29 0645) BP: (85)/(57) 85/57 (12/28 2341) SpO2:  [94 %-100 %] 100 % (12/29 0645) Arterial Line BP: (81-115)/(55-74) 91/60 (12/29 0645) FiO2 (%):  [40 %] 40 % (12/29 0410) Weight:  [109 kg] 109 kg (12/29 0500) Last BM Date: 12/30/2019 (flexiseal)  Weight change: Filed Weights   01/24/20 0615 12/28/2019 0400 01/26/20 0500  Weight: 109.6 kg 109.9 kg 109 kg    Intake/Output:   Intake/Output Summary (Last 24 hours) at 01/26/2020 0830 Last data filed at 01/26/2020 0700 Gross per 24 hour  Intake 4581.79 ml   Output 6660 ml  Net -2078.21 ml      Physical Exam  General: Intubated/sedated.  Neck: JVP 10 cm, no thyromegaly or thyroid nodule.  Lungs: Decreased at bases.  CV: Nondisplaced PMI.  Heart regular S1/S2, no S3/S4, no murmur. 1+ edema to thighs.  No carotid bruit.  Normal pedal pulses.  Abdomen: Soft, nontender, no hepatosplenomegaly, no distention.  Skin: Intact without lesions or rashes.  Neurologic: Will wake up and follow commands per nursing.  Extremities: No clubbing or cyanosis.  HEENT: Normal.    Telemetry  NSR 80s (personally reviewed)   Labs    CBC Recent Labs    01/07/2020 0415 01/07/2020 0416 01/24/2020 1548 01/28/2020 1555 01/26/20 0401 01/26/20 0446  WBC 21.3*  --  19.6*  --  18.1*  --   NEUTROABS 18.7*  --   --   --  15.5*  --   HGB 9.0*   < > 8.6*   < > 8.7* 9.2*  HCT 27.9*   < > 28.1*   < > 27.1* 27.0*  MCV 90.9  --  92.7  --  90.6  --   PLT 256  --  257  --  262  --    < > = values in this interval not displayed.  Basic Metabolic Panel Recent Labs    01/11/2020 1548 01/07/2020 1555 01/26/20 0401 01/26/20 0446  NA 134*   < > 135 138  K 3.9   < > 3.8 3.8  CL 103  --  104  --   CO2 22  --  23  --   GLUCOSE 175*  --  152*  --   BUN 17  --  16  --   CREATININE 1.83*  --  1.73*  --   CALCIUM 7.9*  --  8.0*  --   MG 2.4  --  2.4  --   PHOS 3.4  --  2.1*  --    < > = values in this interval not displayed.   Liver Function Tests Recent Labs    12/31/2019 1548 01/26/20 0401  ALBUMIN 1.9* 1.8*   No results for input(s): LIPASE, AMYLASE in the last 72 hours. Cardiac Enzymes No results for input(s): CKTOTAL, CKMB, CKMBINDEX, TROPONINI in the last 72 hours.  BNP: BNP (last 3 results) Recent Labs    01/15/2020 1619  BNP 577.5*    ProBNP (last 3 results) No results for input(s): PROBNP in the last 8760 hours.   D-Dimer No results for input(s): DDIMER in the last 72 hours. Hemoglobin A1C No results for input(s): HGBA1C in the last 72  hours. Fasting Lipid Panel No results for input(s): CHOL, HDL, LDLCALC, TRIG, CHOLHDL, LDLDIRECT in the last 72 hours. Thyroid Function Tests No results for input(s): TSH, T4TOTAL, T3FREE, THYROIDAB in the last 72 hours.  Invalid input(s): FREET3  Other results:   Imaging    DG CHEST PORT 1 VIEW  Result Date: 01/26/2020 CLINICAL DATA:  Hypoxia EXAM: PORTABLE CHEST 1 VIEW COMPARISON:  January 25, 2020 FINDINGS: Endotracheal tube tip is 3.8 cm above the carina. Swan-Ganz catheter tip is in the right main pulmonary artery. Enteric tube tip is below the diaphragm. Impella device unchanged in position. Right subclavian catheter tip in superior vena cava, stable. There is a left chest tube, unchanged in position. No pneumothorax. There is atelectatic change in the lung bases. No new opacity evident. There is cardiomegaly with pulmonary vascularity within normal limits. No adenopathy. Skin staples overlie the right axillary region. IMPRESSION: Tube and catheter positions as described without pneumothorax. Atelectasis in lower lung regions is stable. Stable cardiomegaly. Electronically Signed   By: Lowella Grip III M.D.   On: 01/26/2020 08:16   ECHOCARDIOGRAM LIMITED  Result Date: 01/08/2020    ECHOCARDIOGRAM LIMITED REPORT   [name] Date of Exam: 01/24/2020 Medical Rec #:  [id number]     Height:       75.0 in Accession #:    [id number]    Weight:       242.3 lb Date of Birth:  1970-04-21     BSA:          2.381 m Patient Age:    64 years      BP:           0/0 mmHg Patient Gender: M             HR:           115 bpm. Exam Location:  [facility] Procedure: Limited Echo Indications:    CHF-Acute Systolic I29.79  History:        Patient has prior history of Echocardiogram examinations, most  recent 01/19/2020. CHF, Defibrillator, Arrythmias:Atrial                 Fibrillation, Signs/Symptoms:Chest Pain; Risk                 Factors:Dyslipidemia, Diabetes and  Hypertension. Cardiogenic                 Shock.  Sonographer:    Leavy Cella Referring Phys: Jewell study for Impella cannula positioning, which is approximately 5 cm across the sortic valve. Severely depressed left ventricular function. Sanda Klein MD Electronically signed by Sanda Klein MD Signature Date/Time: 01/24/2020/10:15:28 AM    Final      Medications:     Scheduled Medications: . sodium chloride   Intravenous Once  . aspirin  324 mg Per Tube Daily  . atorvastatin  80 mg Per Tube Daily  . B-complex with vitamin C  1 tablet Per Tube Daily  . busPIRone  10 mg Per Tube TID  . chlorhexidine gluconate (MEDLINE KIT)  15 mL Mouth Rinse BID  . Chlorhexidine Gluconate Cloth  6 each Topical Daily  . darbepoetin (ARANESP) injection - NON-DIALYSIS  60 mcg Subcutaneous Q Sat-1800  . feeding supplement (PROSource TF)  45 mL Per Tube QID  . fiber  1 packet Per Tube BID  . FLUoxetine  40 mg Per Tube BID  . mouth rinse  15 mL Mouth Rinse 10 times per day  . nystatin  5 mL Oral QID  . pantoprazole sodium  40 mg Per Tube Daily  . sodium chloride flush  10-40 mL Intracatheter Q12H  . sodium chloride flush  3 mL Intravenous Q12H  . valproic acid  250 mg Per Tube BID    Infusions: .  prismasol BGK 4/2.5 500 mL/hr at 01/26/20 0104  .  prismasol BGK 4/2.5 200 mL/hr at 01/19/2020 2103  . sodium chloride Stopped (01/14/2020 1646)  . sodium chloride 10 mL/hr at 01/04/2020 1302  . sodium chloride Stopped (01/16/20 1032)  . amiodarone 60 mg/hr (01/26/20 0716)  . epinephrine 7 mcg/min (01/26/20 0700)  . feeding supplement (PIVOT 1.5 CAL) 1,000 mL (01/26/20 0440)  . fentaNYL infusion INTRAVENOUS 200 mcg/hr (01/26/20 0700)  . impella catheter heparin 50 unit/mL in dextrose 5% 9 mL/hr at 01/21/20 0015  . insulin 3.4 mL/hr at 01/26/20 0700  . lactated ringers 20 mL/hr at 01/26/20 0700  . meropenem (MERREM) IV 1 g (01/26/20 0229)  . midazolam 3 mg/hr (01/26/20 0700)  .  milrinone 0.125 mcg/kg/min (01/26/20 0700)  . norepinephrine (LEVOPHED) Adult infusion 18 mcg/min (01/26/20 0700)  . prismasol BGK 4/2.5 1,500 mL/hr at 01/26/20 6073  . vancomycin Stopped (01/22/2020 1539)  . vasopressin 0.04 Units/min (01/26/20 0700)    PRN Medications: sodium chloride, sodium chloride, artificial tears, bisacodyl **OR** bisacodyl, dextrose, docusate, fentaNYL, fentaNYL (SUBLIMAZE) injection, fentaNYL (SUBLIMAZE) injection, heparin, heparin, metoprolol tartrate, midazolam, midazolam, midazolam, ondansetron (ZOFRAN) IV, oxyCODONE, polyethylene glycol, sodium chloride flush, sodium chloride flush     Assessment/Plan   1. Cardiogenic shock/acute on chronic systolic CHF: Initially nonischemic cardiomyopathy. Possible familial cardiomyopathy as both parents had cardiomyopathy and died at around 72.However, [employer] gene testing did not show any common mutation for cardiomyopathy.  However, this admission noted to have severe 3 vessel disease so suspect component of ischemic cardiomyopathy. St Jude ICD. Echo in 8/20 with EF 15% and mildly decreased RV function. Echo this admission with EF < 20%, moderate LV dilation, RV mildly reduced, severe LAE, no  significant MR.Low output HF with markedly low EF.  He has a long history of cardiomyopathy (20 yrs), tends to minimize symptoms.  Cardiorenal syndrome with creatinine up to 2.2,  stabilized on milrinone and Impella 5.5. SCr 1.44 day of CABG. CABG 12/15.  Post-op shock, back to OR 12/16 to open chest (chest wall compartment syndrome) and again to evacuate hematoma.  TEE post-op with severe RV dysfunction.  CVVHD initiated 12/19 for volume removal in setting of rising Scr/decreased UOP. Suspect ATN. S/p chest closure 12/23. Hemodynamics much worse on 12/24 with rising pressor demands. Co-ox 36%.  Bedside echo severe biventricular dysfunction with marked septal bounce. Chest re-opened 12/24.  Was atrial paced back to NSR on 12/26 with  improvement in hemodynamics but back in atrial fibrillation on 12/27 and unable to pace out, DCCV to NSR on 12/28.  Remains on pressors (NE 16, Epi 7, VP 0.04, milrinone 0.125) and NO 21 ppm.  MAP stable.  Good cardiac output this morning.  CVP 10-11. - Wean pressors slowly today for MAP 65, start with epinephrine.  - Continue milrinone 0.125.  - Start to wean NO, will decrease down to 10 ppm today.  - Aim for CVVH net negative 50-100 cc/hr today.    - Increase Impella to P8 to facilitate weaning of vasoactive meds. Position stable yesterday.  LDH stable.  2. Atrial fibrillation: H/o PAF.  He was on dofetilide in the remote past but this was stopped due to noncompliance. He had an upper GI bleed from antral ulcers in 3/12. He was seen by GI and was deemed safe to restart anticoagulation as long as he remains on a PPI.  No apparent recurrence of AF until just prior to this admission, was cardioverted back to NSR in ER but went back into atrial fibrillation.  Post-op afib, DCCV to NSR/BiV pacing am 12/16 -> back to AF. Rapid atrial pacing back to SR on 12/26 -> back in AF on 12/27 and unable to pace out. 12/28 DCCV to NSR, NSR today.  - Continue IV amiodarone, can decrease to 30 mg/hr.  - Off heparin post-op with bleeding, only getting heparin in Impella purge currently. Discussed with pharmacy, will carefully restart systemic heparin today.  - Did not have Maze with CABG.    3. CAD:   Cath this admission with severe 3 vessel disease. CABG x 4 on 12/15.  - Continue atorvastatin, ASA.  - continue current management 4. Acute on chronic hypoxemic respiratory failure: Vent per CCM.  - CCM following - Will need tracheostomy when chest fully closed.  5. AKI on CKD stage 3: Likely ATN/cardiorenal.  On CVVH.  - Aim for net negative 50-100 cc/hr today.  6. Diabetes: Insulin.  7. Hyponatremia: Resolved.  8. Torsades/ICD shock: 12/7/212 in hospital event, in setting of severe hypokalemia and hypomagnesemia.    9. Gout: h/o gout. Complained of rt knee pain c/w previous flares - treated w/ prednisone burst (completed).  10: ID: Treating for PNA and also with chest wall open.  WBC trending down.  - On vancomycin/meropenem (PCN allergy).  - can add fungal coverage as needed 11. FEN: TFs ongoing.    CRITICAL CARE Performed by: Loralie Champagne  Total critical care time: 40 minutes  Critical care time was exclusive of separately billable procedures and treating other patients.  Critical care was necessary to treat or prevent imminent or life-threatening deterioration.  Critical care was time spent personally by me on the following activities: development of treatment plan with  patient and/or surrogate as well as nursing, discussions with consultants, evaluation of patient's response to treatment, examination of patient, obtaining history from patient or surrogate, ordering and performing treatments and interventions, ordering and review of laboratory studies, ordering and review of radiographic studies, pulse oximetry and re-evaluation of patient's condition.  Loralie Champagne 01/26/2020 8:30 AM

## 2020-01-26 NOTE — Progress Notes (Addendum)
[place] for heparin Indication: Impella 5.5  Allergies  Allergen Reactions  . Orange Fruit Anaphylaxis, Hives and Other (See Comments)    Per Pt- Blisters around lips and Hives all over, also  . Penicillins Anaphylaxis    Did it involve swelling of the face/tongue/throat, SOB, or low BP? Yes Did it involve sudden or severe rash/hives, skin peeling, or any reaction on the inside of your mouth or nose? Yes Did you need to seek medical attention at a hospital or doctor's office? No When did it last happen?childhood If all above answers are "NO", may proceed with cephalosporin use.  [name] [Empagliflozin] Itching  . Basaglar Kwikpen [Insulin Glargine] Nausea And Vomiting    Patient Measurements: Height: 6\' 3"  (190.5 cm) Weight: 109 kg (240 lb 4.8 oz) IBW/kg (Calculated) : 84.5 Heparin dosing wt: 101 kg  Vital Signs: Temp: 98.3 F (36.8 C) (12/29 1345) Temp Source: Core (12/29 1200) Pulse Rate: 92 (12/29 1607)  Labs: Recent Labs    01/20/2020 0415 01/03/2020 0416 01/27/2020 1548 01/02/2020 1555 01/26/20 0401 01/26/20 0446 01/26/20 1554 01/26/20 1556  HGB 9.0*   < > 8.6*   < > 8.7* 9.2*  --  9.5*  HCT 27.9*   < > 28.1*   < > 27.1* 27.0*  --  28.0*  PLT 256  --  257  --  262  --   --   --   HEPARINUNFRC <0.10*  --   --   --  <0.10*  --  <0.10*  --   CREATININE  --    < > 1.83*  --  1.73*  --  1.70*  --    < > = values in this interval not displayed.    Estimated Creatinine Clearance: 68.6 mL/min (A) (by C-G formula based on SCr of 1.7 mg/dL (H)).   Assessment: 49 yo male s/p impella 5.5 placement. Pt started on heparinized purge and systemic heparin. Pt now s/p CABG 12/15 with Impella remaining in place c/b significant bleeding and hematoma requiring opened chest and reexploration in OR 12/16. Chest closed on 12/23, re-opened on 12/24.  Impella on P8 with stable purge pressures, purge flow rate 8.9 ml/hr (approx 445 units/hr  heparin). Heparin level undetectable as expected, Hgb at 9.2, plt 262. LDH stable at 333. No s/sx of bleeding per nursing. Underwent cardioversion in OR yesterday, s/p sternal wash out/application of wound VAC. Chest remains open, will dose heparin conservatively.   Goal of Therapy:  Heparin level 0.2-0.3 units/ml Monitor platelets by anticoagulation protocol: Yes   Plan:  Continue heparin via the purge   Increase systemic heparin infusion to 300 units/hr  Daily heparin level, LDH, CBC, and for s/sx of bleeding  Benetta Spar, PharmD, BCPS, [facility] Clinical Pharmacist  Please check AMION for all [place] phone numbers After 10:00 PM, call [place]

## 2020-01-26 NOTE — Progress Notes (Addendum)
NAME:  Corey Palmer, MRN:  [id number], DOB:  Jul 12, 1970, LOS: 32 ADMISSION DATE:  01/27/2020, CONSULTATION DATE: 01/08/2020 REFERRING MD: Aundra Dubin, CHIEF COMPLAINT: Respiratory failure post Impella  HPI/course in hospital  49 year old man admitted with A. fib/RVR, torsades, cardiogenic shock [EF 15] requiring Impella, CABG x5, AKI requiring initiation of CRRT  Past Medical History:    has a past medical history of Anginal pain ([place]), Anxiety, Arthritis, Automatic implantable cardioverter-defibrillator in situ (2007), Bipolar disorder ([place]), CAD (coronary artery disease), Cancer ([place]) (2013), CHF (congestive heart failure) ([place]), Chondrocalcinosis of right knee (2/33/0076), Chronic systolic heart failure ([place]), Depression, Diabetes uncomplicated adult-type II, Dilated cardiomyopathy ([place]), Dyslipidemia, Dysrhythmia, GERD (gastroesophageal reflux disease), Gout, unspecified, Hepatic steatosis (2011), History of kidney stones, echocardiogram, Hypertension, Obesity (BMI 30.0-34.9), Pacemaker, Paroxysmal atrial fibrillation ([place]), Peptic ulcer, Shortness of breath, and Sleep apnea.  Significant Hospital Events:  12/2 Afib with RVR. Cardioverted in ED. AICD did not fire. Milrinone for low co ox.  12/7 ICD fired, Torsades- likely from low K of 2.8.  12/8 Cath- multivessel disease w/ low CO. Volume overloaded.  12/11 Underwent Impella 5.5 insertion for persistent symptoms of forward failure with low cardiac indices. 12/12 Back to the OR for displacement of Impella. Extubated.  12/15 CABG x4, with LIMA-LAD, SVG-PDA/PLV, SVG-OM 12/16 Emergently opened chest at bedside for tamponade, clot compressing right atrium  12/16 To OR for Exploration of chest due to bleeding  12/19 Started CRRT 12/23 Sternal Closure in OR 12/24 Worsening shock.  Chest reopened at bedside with improvement in hemodynamics.  No mediastinal hematoma. 12/25 A flutter > rapid a paced to sinus rhythm.  Remains on high-dose pressors,  inotropes 12/28 Weaning vasopressor requirements.  For chest washout today.  Back in atrial fibrillation, refractory to increased amiodarone and attempted overdrive pacing.  DCCV to NSR. Tolerating fluid removal via CRRT.  Consults:  PCCM, nephrology, cardiology, cardiothoracic surgery  Procedures:  12/2 R PICC >> 12/11 right ax impella 5.5 >> 12/15 ETT >> 12/15 right femoral arterial sheath >> 12/19 right femoral HD >> 12/23 chest tube-right pleural, left pleural , mediastinal  >>  Significant Diagnostic Tests:    Micro Data:  Blood culture 12/14 > negative  Blood culture 12/16 > negative Respiratory culture 12/20 > rare candida albicans Sternal wound culture 12/23 > no growth Fungal cx sternal wound 12/23 >> neg Fungal cx w/stain sternal wound 12/23 >> AFB sternal wound 12/23 >>   Antimicrobials:  Vancomycin 12/16 >> Meropenem 12/16 >>  Interval history:   DCCV to NSR; Partial closure of chest with wound vac placement 12/28 Afebrile overnight  Remains on CRRT, UF -50 to 100 Impella 5.5, P7 , LDH 357-> 349-> 333, flow 3.9  coox 59% Remains on NO Improved NE requirements (18 mcg/min), remains on epi 7 mcg/min, vasopressin, milrinone 0.125 mcg/kg/min, amio gtt, insulin, fentanyl and versed gtts  Objective   Blood pressure (!) 85/57, pulse 86, temperature 98.06 F (36.7 C), resp. rate (!) 28, height 6\' 3"  (1.905 m), weight 109 kg, SpO2 100 %. PAP: (23-31)/(14-21) 26/17 CVP:  [4 mmHg-13 mmHg] 6 mmHg CO:  [5.7 L/min-6.7 L/min] 5.7 L/min CI:  [2.5 L/min/m2-2.9 L/min/m2] 2.5 L/min/m2  Vent Mode: PRVC FiO2 (%):  [40 %] 40 % Set Rate:  [28 bmp] 28 bmp Vt Set:  [510 mL] 510 mL PEEP:  [5 cmH20] 5 cmH20 Pressure Support:  [5 cmH20] 5 cmH20 Plateau Pressure:  [21 cmH20-29 cmH20] 24 cmH20   Intake/Output Summary (Last 24 hours) at 01/26/2020 [id number]  Last data filed at 01/26/2020 0700 Gross per 24 hour  Intake 4581.79 ml  Output 6660 ml  Net -2078.21 ml   Filed Weights    01/24/20 0615 01/15/2020 0400 01/26/20 0500  Weight: 109.6 kg 109.9 kg 109 kg   PAP: (23-31)/(14-21) 26/17 CVP:  [4 mmHg-13 mmHg] 6 mmHg CO:  [5.7 L/min-6.7 L/min] 5.7 L/min CI:  [2.5 L/min/m2-2.9 L/min/m2] 2.5 L/min/m2  Examination: General:  Critically ill male in NAD  HEENT: MM pink/moist, pupils 3/reactive, anicteric, right cortrak, ETT Neuro: sedated, eyes open to voice, intermittently f/c- attempts to stick tongue out and wiggle toes CV: NSR, distant heart sounds, CT x 3 (2 pleural/ mediastinal) - no output PULM:  MV supported breaths, scattered rhonchi RUL otherwise coarse GI: obese, +bs, ND, no foley, rectal pouch Extremities: warm/dry, anasarca  Skin: no rashes  CXR- reviewed, line stable, bibasilar atelectasis  Labs reviewed.  sCr 1.83-> 1.73, WBC 19.6->  18.1, Hgb stable  - 2.1/ net neg -9L  Assessment & Plan:  Critically ill due to Acute hypoxic respiratory failure - continue full MV support, PRVC 6 cc/kg, rate 28 - ABG reviewed this morning, 7.384/ 42/ 93/ 25.4 - plans for trach concurrently with full chest closure  - VAP bundle/ PPI - intermittent CXR   Critically ill due to Cardiogenic shock due to acute on chronic systolic heart failure requiring mechanical cardiac support with Impala and titration of inotropes Severe coronary artery disease status post CABG, complicated by cardiac tamponade, post hematoma removal in OR x 2, partial closure 12/28 - continue to slowly wean vasopressors, starting with Epi for MAP goal > 65, continue NE and vasopressin - increase Impella from P7 to P8 to help wean vasopressors, continue to trend LDH - NO decrease to 10 ppm today per HF - ongoing CRRT goal neg 50-100/ hr - chest tubes per TCTS  - continue ASA, atorvastatin - continue empiric abx vanc/ meropenem  - follow wound cultures  Critically ill due to Afib with hemodynamic instability - has been refractory to pacing/ meds, s/p DCCV 12/28  - remains in NSR - Amio gtt per HF   - heparin only with Impella purge (given post-op bleeding/ prior hx GIB)  Diabetes type 2 with hyperglycemia - Insulin infusion till chest fully closed  Acute kidney injury-improving cardiorenal syndrome  - remains on CRRT, goal neg 50-100/ hr - trend renal indices/ mag/ phos/ daily wts/ strict I/O's - prn bladder scan   Acute blood loss anemia - trend CBC - transfuse for Hgb < 8  Daily Goals Checklist  Pain/Anxiety/Delirium protocol (if indicated): fentanyl and versed infusion to RASS-3, lighten once chest closed.  VAP protocol (if indicated): bundle in place. Respiratory support goals: full ventilatory support. Wean once chest closed.  Blood pressure target: MAP>65 DVT prophylaxis:  Heparin via CRRT circuit. Nutrition Status: high nutritional risk. EN per Cortrak. GI prophylaxis: pantoprazole Fluid status goals: CRRT to net UF -50 to -18ml/h Urinary catheter: not required due to anuria. periodic bladder scan. Central lines: PICC, PAC, HD catheter. Glucose control: on insulin infusion, transition once chest fully closed.  Mobility/therapy needs: bed rest  Antibiotic de-escalation: continue current antibiotics until chest closed. Daily labs: CBC, renal function, cooximetry Code Status: full  Family Communication: wife updated at the bedside 12/29 Disposition: ICU   Critical care time:   Total critical care time: 40 minutes   Kennieth Rad, ACNP [facility] 01/26/2020, 8:24 AM

## 2020-01-26 NOTE — Op Note (Signed)
Procedure(s): STERNAL [place] OUT APPLICATION OF WOUND VAC Procedure Note  Corey Palmer male 49 y.o. 01/08/2020 Procedure(s) and Anesthesia Type:    * STERNAL [place] OUT - General    * APPLICATION OF WOUND VAC DC cardioversion Surgeon(s) and Role:    * [name], MD - Primary   Indications: The patient is status post redo sternotomy and coronary artery bypass grafting who has an open chest due to cardiomyopathy and right heart dysfunction.  He is taken to the operating room for sternal irrigation and debridement and placement of a wound VAC     Surgeon: [name]   Assistants: Staff  Anesthesia: General endotracheal anesthesia  ASA Class: 5    Procedure Detail  STERNAL [place] OUT, APPLICATION OF WOUND VAC After informed consent was obtained from the patient's family, he is taken the operating room with the above listed date.  Is placed in the supine position on the operating table.  Anesthesia was confirmed by the general endotracheal technique.  The existing dressing was taken down and the anterior chest was cleansed and draped as a sterile field.  A preop surgical pause was performed.  Copious irrigation of the presternal tissues was performed.  Careful inspection is made through and the bilateral pleural spaces and then the mediastinum to exclude any hematoma or other material.  DC cardioversion was performed successfully chest tubes were returned to their original positions.  A spacer is left between the sternal halves.  A white wound VAC foam sponges placed over the heart and then a standard granular foam sponge was placed in the soft tissue.  This was then covered with a VAC drape.  This concluded procedure.  All sponge instruments and needle counts were correct.  Estimated Blood Loss:  Minimal         Drains: 3 chest tubes in the mediastinum and bilateral pleural spaces          Blood Given: none          Specimens: None         Implants: none         Complications:  * No complications entered in OR log *         Disposition: ICU - intubated and critically ill.         Condition: stable

## 2020-01-26 NOTE — Progress Notes (Signed)
[facility] NEPHROLOGY PROGRESS NOTE  Assessment/ Plan:  #AKI/CKD stage III- presumably due to ischemic ATN in setting of cardiogenic shock s/p CABG.  CRRT started 01/16/20. All fluids 4K/2.5Ca: Pre-filter 500 ml/hr, post filter 200 ml/hr, dialysate 1500 ml/hr. No heparin due to hematoma. Right femoral HD catheter placed 01/16/20. -Tolerating CRRT well, no problem with filter.  Still volume up, UF as tolerated.  Continue current CRRT prescription.  # CAD s/p CABG complicated by cardiogenic shock and post-op hematoma. secondary closure on 12/23 then had to be re opened.  Went to the OR for washout on 12/28.  # Cardiogenic shock- underlying familial cardiomyopathy s/p redo CABG. Remains on pressors: Levophed, epinephrine, and vasopressin. Impella and milrinone.   # ABLA- s/p postoperative bleeding into chest cavity s/p re-exploration for tamponade/hematoma. CT surgery following.stable, added ESA.  #Hypophosphatemia- due to CRRT. Replete K phos.  # Atrial fibrillation - s/p DCCV on 12/16.  On amiodarone.  # VDRF- per primary svc.  #-Leukocytosis.On vancomycin and meropenem.   Per primary team.  Discussed with the patient's wife at bedside.  Subjective: Seen and examined.  Taken to the OR yesterday.  Remains on multiple pressors and sedated.  Tolerating CRRT well.  His wife at bedside.  Objective Vital signs in last 24 hours: Vitals:   01/26/20 0745 01/26/20 0800 01/26/20 0815 01/26/20 0830  BP:      Pulse: 89 89 88 87  Resp: (!) 28 (!) 25 (!) 22 (!) 28  Temp: 97.88 F (36.6 C) 97.88 F (36.6 C) 98 F (36.7 C) 98 F (36.7 C)  TempSrc:    Core  SpO2: 100% 100% 99% 98%  Weight:      Height:       Weight change: -0.9 kg  Intake/Output Summary (Last 24 hours) at 01/26/2020 0837 Last data filed at 01/26/2020 0800 Gross per 24 hour  Intake 4753.91 ml  Output 6896 ml  Net -2142.09 ml       Labs: Basic Metabolic Panel: Recent Labs  Lab  01/24/20 1600 01/24/20 1903 01/21/2020 1200 01/21/2020 1548 01/19/2020 1555 01/26/20 0401 01/26/20 0446  NA 135   < > 135 134* 135 135 138  K 3.6   < > 4.3 3.9 3.9 3.8 3.8  CL 104  --  105 103  --  104  --   CO2 24  --   --  22  --  23  --   GLUCOSE 124*  --  127* 175*  --  152*  --   BUN 17  --  18 17  --  16  --   CREATININE 1.89*  --  1.70* 1.83*  --  1.73*  --   CALCIUM 7.9*  --   --  7.9*  --  8.0*  --   PHOS 2.9  --   --  3.4  --  2.1*  --    < > = values in this interval not displayed.   Liver Function Tests: Recent Labs  Lab 01/11/2020 1652 01/21/20 0410 01/21/20 1551 01/22/20 0319 01/22/20 1546 01/24/20 1600 01/21/2020 1548 01/26/20 0401  AST 42* 39  --  36  --   --   --   --   ALT 76* 62*  --  45*  --   --   --   --   ALKPHOS 162* 134*  --  122  --   --   --   --   BILITOT 1.2 1.3*  --  1.1  --   --   --   --   PROT 6.0* 6.2*  --  5.5*  --   --   --   --   ALBUMIN 2.1* 2.3*   < > 2.0*   < > 1.8* 1.9* 1.8*   < > = values in this interval not displayed.   No results for input(s): LIPASE, AMYLASE in the last 168 hours. No results for input(s): AMMONIA in the last 168 hours. CBC: Recent Labs  Lab 01/24/20 0245 01/24/20 0647 01/24/20 1700 01/24/20 1903 01/16/2020 0415 01/15/2020 0416 01/27/2020 1548 01/16/2020 1555 01/26/20 0401 01/26/20 0446  WBC 22.0*  --  22.2*  --  21.3*  --  19.6*  --  18.1*  --   NEUTROABS 19.3*  --   --   --  18.7*  --   --   --  15.5*  --   HGB 8.4*   < > 8.6*   < > 9.0*   < > 8.6* 9.2* 8.7* 9.2*  HCT 27.4*   < > 27.7*   < > 27.9*   < > 28.1* 27.0* 27.1* 27.0*  MCV 92.6  --  92.0  --  90.9  --  92.7  --  90.6  --   PLT 247  --  273  --  256  --  257  --  262  --    < > = values in this interval not displayed.   Cardiac Enzymes: No results for input(s): CKTOTAL, CKMB, CKMBINDEX, TROPONINI in the last 168 hours. CBG: Recent Labs  Lab 01/26/20 0326 01/26/20 0432 01/26/20 0543 01/26/20 0657 01/26/20 0824  GLUCAP 157* 134* 130* 144* 125*     Iron Studies: No results for input(s): IRON, TIBC, TRANSFERRIN, FERRITIN in the last 72 hours. Studies/Results: DG Chest 1 View  Result Date: 01/04/2020 CLINICAL DATA:  Status post open heart surgery, respiratory failure EXAM: CHEST  1 VIEW COMPARISON:  01/24/2020 FINDINGS: Endotracheal tube seen at 3.3 cm above the carina. Nasoenteric feeding tube extends into the upper abdomen beyond the margin of the examination. Right internal jugular Swan-Ganz catheter is seen with its tip within the right pulmonary artery. Right subclavian left ventricular assist device overlies its expected position across the aortic root and left ventricular outflow tract. Bilateral chest tubes are in place. Lung volumes are small. Tiny left pleural effusion present. Mild to moderate perihilar interstitial pulmonary edema is present. No pneumothorax. Cardiac size is mildly enlarged, unchanged. Left subclavian pacemaker is unchanged with its lead extending beyond the inferior margin of the exam. IMPRESSION: Stable support lines and tubes. Bilateral chest tubes in place.  No pneumothorax. Moderate perihilar interstitial pulmonary edema. Stable pulmonary hypoinflation. Electronically Signed   By: Fidela Salisbury MD   On: 01/22/2020 06:05   DG CHEST PORT 1 VIEW  Result Date: 01/26/2020 CLINICAL DATA:  Hypoxia EXAM: PORTABLE CHEST 1 VIEW COMPARISON:  January 25, 2020 FINDINGS: Endotracheal tube tip is 3.8 cm above the carina. Swan-Ganz catheter tip is in the right main pulmonary artery. Enteric tube tip is below the diaphragm. Impella device unchanged in position. Right subclavian catheter tip in superior vena cava, stable. There is a left chest tube, unchanged in position. No pneumothorax. There is atelectatic change in the lung bases. No new opacity evident. There is cardiomegaly with pulmonary vascularity within normal limits. No adenopathy. Skin staples overlie the right axillary region. IMPRESSION: Tube and catheter positions  as described without pneumothorax. Atelectasis in lower  lung regions is stable. Stable cardiomegaly. Electronically Signed   By: Lowella Grip III M.D.   On: 01/26/2020 08:16   DG Chest Port 1 View  Result Date: 01/24/2020 CLINICAL DATA:  Follow-up open heart surgery EXAM: PORTABLE CHEST 1 VIEW COMPARISON:  11/22/2019 FINDINGS: Endotracheal tube tip 2.5 cm above the carina. Orogastric or nasogastric tube enters the stomach. Bilateral chest tubes in place. Right internal jugular Swan-Ganz catheter with tip in the right main pulmonary artery. Left heart Impella device in place. Slightly less pulmonary edema than was seen yesterday. Mild atelectasis in the left lower lobe. No pneumothorax. IMPRESSION: Lines and tubes well positioned. Slightly less pulmonary edema. No pneumothorax. Electronically Signed   By: Nelson Chimes M.D.   On: 01/24/2020 09:02   DG Abd Portable 1V  Result Date: 01/24/2020 CLINICAL DATA:  Feeding tube placement EXAM: PORTABLE ABDOMEN - 1 VIEW COMPARISON:  01/21/2020 FINDINGS: Feeding tube tip in the region of the gastric antrum or duodenal bulb. Normal bowel gas pattern. Left chest tube noted. IMPRESSION: Feeding tube tip in the region of the gastric antrum or duodenal bulb. Electronically Signed   By: Franchot Gallo M.D.   On: 01/24/2020 11:38   ECHOCARDIOGRAM LIMITED  Result Date: 01/17/2020    ECHOCARDIOGRAM LIMITED REPORT   [name] Date of Exam: 01/11/2020 Medical Rec #:  [id number]     Height:       75.0 in Accession #:    [id number]    Weight:       242.3 lb Date of Birth:  [birth year]     BSA:          2.381 m Patient Age:    89 years      BP:           0/0 mmHg Patient Gender: M             HR:           115 bpm. Exam Location:  [facility] Procedure: Limited Echo Indications:    CHF-Acute Systolic M42.68  History:        Patient has prior history of Echocardiogram examinations, most                 recent 01/19/2020. CHF, Defibrillator,  Arrythmias:Atrial                 Fibrillation, Signs/Symptoms:Chest Pain; Risk                 Factors:Dyslipidemia, Diabetes and Hypertension. Cardiogenic                 Shock.  Sonographer:    Leavy Cella Referring Phys: [place] study for Impella cannula positioning, which is approximately 5 cm across the sortic valve. Severely depressed left ventricular function. Sanda Klein MD Electronically signed by Sanda Klein MD Signature Date/Time: 01/03/2020/10:15:28 AM    Final     Medications: Infusions: .  prismasol BGK 4/2.5 500 mL/hr at 01/26/20 0104  .  prismasol BGK 4/2.5 200 mL/hr at 01/27/2020 2103  . sodium chloride Stopped (01/24/2020 1646)  . sodium chloride 10 mL/hr at 01/10/2020 1302  . sodium chloride Stopped (01/16/20 1032)  . amiodarone 60 mg/hr (01/26/20 0800)  . epinephrine 7 mcg/min (01/26/20 0800)  . feeding supplement (PIVOT 1.5 CAL) 1,000 mL (01/26/20 0440)  . fentaNYL infusion INTRAVENOUS 150 mcg/hr (01/26/20 0800)  . heparin    . impella catheter heparin 50  unit/mL in dextrose 5% 9 mL/hr at 01/21/20 0015  . insulin 3.4 mL/hr at 01/26/20 0800  . lactated ringers 20 mL/hr at 01/26/20 0800  . meropenem (MERREM) IV 1 g (01/26/20 0229)  . midazolam Stopped (01/26/20 0725)  . milrinone 0.125 mcg/kg/min (01/26/20 0800)  . norepinephrine (LEVOPHED) Adult infusion 14 mcg/min (01/26/20 0800)  . prismasol BGK 4/2.5 1,500 mL/hr at 01/26/20 5110  . vancomycin Stopped (01/04/2020 1539)  . vasopressin 0.04 Units/min (01/26/20 0800)    Scheduled Medications: . sodium chloride   Intravenous Once  . aspirin  324 mg Per Tube Daily  . atorvastatin  80 mg Per Tube Daily  . B-complex with vitamin C  1 tablet Per Tube Daily  . busPIRone  10 mg Per Tube TID  . chlorhexidine gluconate (MEDLINE KIT)  15 mL Mouth Rinse BID  . Chlorhexidine Gluconate Cloth  6 each Topical Daily  . darbepoetin (ARANESP) injection - NON-DIALYSIS  60 mcg Subcutaneous Q Sat-1800  .  feeding supplement (PROSource TF)  45 mL Per Tube QID  . fiber  1 packet Per Tube BID  . FLUoxetine  40 mg Per Tube BID  . mouth rinse  15 mL Mouth Rinse 10 times per day  . nystatin  5 mL Oral QID  . pantoprazole sodium  40 mg Per Tube Daily  . sodium chloride flush  10-40 mL Intracatheter Q12H  . sodium chloride flush  3 mL Intravenous Q12H  . valproic acid  250 mg Per Tube BID    have reviewed scheduled and prn medications.  Physical Exam: General: Critically ill male, intubated, sedated, multiple IV lines and pressors. Heart:RRR, s1s2 nl Lungs open external wound.  Coarse breath sound. Abdomen:soft, non-distended Extremities: Bilateral lower extremity edema present, unchanged Dialysis Access: Right femoral HD catheter.  Paraskevi Funez Prasad Treylon Henard 01/26/2020,8:37 AM  LOS: 27 days  Pager: [phone]

## 2020-01-26 NOTE — Plan of Care (Signed)
  Problem: Education: Goal: Ability to demonstrate management of disease process will improve Outcome: Progressing Goal: Ability to verbalize understanding of medication therapies will improve Outcome: Progressing Goal: Individualized Educational Video(s) Outcome: Progressing   Problem: Activity: Goal: Capacity to carry out activities will improve Outcome: Progressing   Problem: Cardiac: Goal: Ability to achieve and maintain adequate cardiopulmonary perfusion will improve Outcome: Progressing   Problem: Education: Goal: Ability to demonstrate management of disease process will improve Outcome: Progressing Goal: Ability to verbalize understanding of medication therapies will improve Outcome: Progressing   Problem: Activity: Goal: Capacity to carry out activities will improve Outcome: Progressing   Problem: Cardiac: Goal: Ability to achieve and maintain adequate cardiopulmonary perfusion will improve Outcome: Progressing   Problem: Education: Goal: Ability to verbalize understanding of medication therapies will improve Outcome: Progressing   Problem: Activity: Goal: Capacity to carry out activities will improve Outcome: Progressing   Problem: Cardiac: Goal: Ability to achieve and maintain adequate cardiopulmonary perfusion will improve Outcome: Progressing   Problem: Education: Goal: Knowledge of General Education information will improve Description: Including pain rating scale, medication(s)/side effects and non-pharmacologic comfort measures Outcome: Progressing   Problem: Health Behavior/Discharge Planning: Goal: Ability to manage health-related needs will improve Outcome: Progressing   Problem: Clinical Measurements: Goal: Ability to maintain clinical measurements within normal limits will improve Outcome: Progressing Goal: Will remain free from infection Outcome: Progressing Goal: Diagnostic test results will improve Outcome: Progressing Goal: Respiratory  complications will improve Outcome: Progressing Goal: Cardiovascular complication will be avoided Outcome: Progressing   Problem: Activity: Goal: Risk for activity intolerance will decrease Outcome: Progressing   Problem: Coping: Goal: Level of anxiety will decrease Outcome: Progressing   Problem: Skin Integrity: Goal: Risk for impaired skin integrity will decrease Outcome: Progressing   Problem: Education: Goal: Will demonstrate proper wound care and an understanding of methods to prevent future damage Outcome: Progressing Goal: Knowledge of disease or condition will improve Outcome: Progressing Goal: Knowledge of the prescribed therapeutic regimen will improve Outcome: Progressing Goal: Individualized Educational Video(s) Outcome: Progressing   Problem: Activity: Goal: Risk for activity intolerance will decrease Outcome: Progressing   Problem: Cardiac: Goal: Will achieve and/or maintain hemodynamic stability Outcome: Progressing   Problem: Clinical Measurements: Goal: Postoperative complications will be avoided or minimized Outcome: Progressing   Problem: Respiratory: Goal: Respiratory status will improve Outcome: Progressing   Problem: Skin Integrity: Goal: Wound healing without signs and symptoms of infection Outcome: Progressing Goal: Risk for impaired skin integrity will decrease Outcome: Progressing   Problem: Urinary Elimination: Goal: Ability to achieve and maintain adequate renal perfusion and functioning will improve Outcome: Progressing

## 2020-01-26 NOTE — Progress Notes (Signed)
[place] for heparin Indication: Impella 5.5  Allergies  Allergen Reactions  . Orange Fruit Anaphylaxis, Hives and Other (See Comments)    Per Pt- Blisters around lips and Hives all over, also  . Penicillins Anaphylaxis    Did it involve swelling of the face/tongue/throat, SOB, or low BP? Yes Did it involve sudden or severe rash/hives, skin peeling, or any reaction on the inside of your mouth or nose? Yes Did you need to seek medical attention at a hospital or doctor's office? No When did it last happen?childhood If all above answers are "NO", may proceed with cephalosporin use.  [name] [Empagliflozin] Itching  . [name] [Insulin Glargine] Nausea And Vomiting    Patient Measurements: Height: 6\' 3"  (190.5 cm) Weight: 109 kg (240 lb 4.8 oz) IBW/kg (Calculated) : 84.5 Heparin dosing wt: 101 kg  Vital Signs: Temp: 98.06 F (36.7 C) (12/29 0645) Temp Source: Core (12/29 0400) BP: 85/57 (12/28 2341) Pulse Rate: 86 (12/29 0645)  Labs: Recent Labs    01/24/20 0245 01/24/20 0647 01/24/2020 0415 01/12/2020 0416 01/12/2020 1200 01/10/2020 1548 01/27/2020 1555 01/26/20 0401 01/26/20 0446  HGB 8.4*   < > 9.0*   < > 10.2* 8.6* 9.2* 8.7* 9.2*  HCT 27.4*   < > 27.9*   < > 30.0* 28.1* 27.0* 27.1* 27.0*  PLT 247   < > 256  --   --  257  --  262  --   HEPARINUNFRC <0.10*  --  <0.10*  --   --   --   --  <0.10*  --   CREATININE 1.88*   < >  --   --  1.70* 1.83*  --  1.73*  --    < > = values in this interval not displayed.    Estimated Creatinine Clearance: 67.4 mL/min (A) (by C-G formula based on SCr of 1.73 mg/dL (H)).   Assessment: 49 yo male s/p impella 5.5 placement. Pt started on heparinized purge and systemic heparin. Pt now s/p CABG 12/15 with Impella remaining in place c/b significant bleeding and hematoma requiring opened chest and reexploration in OR 12/16. Chest closed on 12/23, re-opened on 12/24.  Impella on P8 with  stable purge pressures. Heparin running via purge only at this time, purge flow rate 9.2 ml/hr (approx 460 units/hr heparin). Heparin level undetectable as expected, Hgb at 9.2, plt 262. LDH stable at 333. No s/sx of bleeding per nursing. Underwent cardioversion in OR yesterday, s/p sternal wash out/application of wound VAC.   Discussed with team - plan to start low dose heparin infusion systemically.   Goal of Therapy:  Heparin level 0.2-0.3 units/ml Monitor platelets by anticoagulation protocol: Yes   Plan:  Continue heparin via the purge only  Start heparin infusion systemically at 200 units/hr  Order heparin level in 6 hours Daily heparin level, LDH, CBC, and for s/sx of bleeding  [name], PharmD, [place] Pharmacist  Phone: [phone] 01/26/2020 8:27 AM  Please check AMION for all [place] phone numbers After 10:00 PM, call [place] [phone]

## 2020-01-26 NOTE — Progress Notes (Signed)
1 Day Post-Op Procedure(s) (LRB): STERNAL WASH OUT (N/A) APPLICATION OF WOUND VAC (N/A) Subjective: sedated  Objective: Vital signs in last 24 hours: Temp:  [97.88 F (36.6 C)-99.1 F (37.3 C)] 98.3 F (36.8 C) (12/29 1345) Pulse Rate:  [83-96] 92 (12/29 1607) Cardiac Rhythm: Normal sinus rhythm;Heart block (12/29 1200) Resp:  [2-29] 28 (12/29 1607) BP: (85)/(57) 85/57 (12/28 2341) SpO2:  [94 %-100 %] 100 % (12/29 1607) Arterial Line BP: (81-115)/(55-74) 95/70 (12/29 1345) FiO2 (%):  [40 %] 40 % (12/29 1607) Weight:  [109 kg] 109 kg (12/29 0500)  Hemodynamic parameters for last 24 hours: PAP: (22-29)/(14-21) 27/19 CVP:  [4 mmHg-12 mmHg] 12 mmHg CO:  [5.7 L/min-6.7 L/min] 5.7 L/min CI:  [2.5 L/min/m2-2.9 L/min/m2] 2.5 L/min/m2  Intake/Output from previous day: 12/28 0701 - 12/29 0700 In: 4761.4 [I.V.:3458; NG/GT:815; IV Piggyback:300.1] Out: 0762 [Drains:30; Chest Tube:200] Intake/Output this shift: Total I/O In: 2250.6 [I.V.:1111.4; Other:82.1; NG/GT:349.8; IV Piggyback:707.2] Out: 2662 [Other:2662]  General appearance: intubated/sedated Neurologic: Unable to fully assess Heart: regular rate and rhythm, S1, S2 normal, no murmur, click, rub or gallop Lungs: clear to auscultation bilaterally Abdomen: soft, non-tender; bowel sounds normal; no masses,  no organomegaly Extremities: edema 2+ Wound: Dressing intact  Lab Results: Recent Labs    01/04/2020 1548 01/19/2020 1555 01/26/20 0401 01/26/20 0446 01/26/20 1556  WBC 19.6*  --  18.1*  --   --   HGB 8.6*   < > 8.7* 9.2* 9.5*  HCT 28.1*   < > 27.1* 27.0* 28.0*  PLT 257  --  262  --   --    < > = values in this interval not displayed.   BMET:  Recent Labs    01/26/20 0401 01/26/20 0446 01/26/20 1554 01/26/20 1556  NA 135   < > 136 137  K 3.8   < > 4.0 4.0  CL 104  --  103  --   CO2 23  --  23  --   GLUCOSE 152*  --  133*  --   BUN 16  --  19  --   CREATININE 1.73*  --  1.70*  --   CALCIUM 8.0*  --  8.0*   --    < > = values in this interval not displayed.    PT/INR: No results for input(s): LABPROT, INR in the last 72 hours. ABG    Component Value Date/Time   PHART 7.365 01/26/2020 1556   HCO3 25.4 01/26/2020 1556   TCO2 27 01/26/2020 1556   ACIDBASEDEF 2.0 01/19/2020 1555   O2SAT 98.0 01/26/2020 1556   CBG (last 3)  Recent Labs    01/26/20 1252 01/26/20 1437 01/26/20 1553  GLUCAP 161* 150* 141*    Assessment/Plan: S/P Procedure(s) (LRB): STERNAL WASH OUT (N/A) APPLICATION OF WOUND VAC (N/A) Continue to remove fluid via the dialysis circuit support  Support hemodynamically with Impella and inotropic medications    LOS: 27 days    [name] 01/26/2020

## 2020-01-27 ENCOUNTER — Inpatient Hospital Stay (HOSPITAL_COMMUNITY): Payer: BC Managed Care – PPO

## 2020-01-27 DIAGNOSIS — E1165 Type 2 diabetes mellitus with hyperglycemia: Secondary | ICD-10-CM

## 2020-01-27 DIAGNOSIS — J9601 Acute respiratory failure with hypoxia: Secondary | ICD-10-CM | POA: Diagnosis not present

## 2020-01-27 DIAGNOSIS — G9340 Encephalopathy, unspecified: Secondary | ICD-10-CM

## 2020-01-27 DIAGNOSIS — Z794 Long term (current) use of insulin: Secondary | ICD-10-CM

## 2020-01-27 DIAGNOSIS — I4891 Unspecified atrial fibrillation: Secondary | ICD-10-CM | POA: Diagnosis not present

## 2020-01-27 DIAGNOSIS — R570 Cardiogenic shock: Secondary | ICD-10-CM | POA: Diagnosis not present

## 2020-01-27 LAB — RENAL FUNCTION PANEL
Albumin: 1.8 g/dL — ABNORMAL LOW (ref 3.5–5.0)
Albumin: 1.9 g/dL — ABNORMAL LOW (ref 3.5–5.0)
Anion gap: 7 (ref 5–15)
Anion gap: 11 (ref 5–15)
BUN: 21 mg/dL — ABNORMAL HIGH (ref 6–20)
BUN: 26 mg/dL — ABNORMAL HIGH (ref 6–20)
CO2: 24 mmol/L (ref 22–32)
CO2: 21 mmol/L — ABNORMAL LOW (ref 22–32)
Calcium: 7.6 mg/dL — ABNORMAL LOW (ref 8.9–10.3)
Calcium: 7.9 mg/dL — ABNORMAL LOW (ref 8.9–10.3)
Chloride: 102 mmol/L (ref 98–111)
Chloride: 101 mmol/L (ref 98–111)
Creatinine, Ser: 1.6 mg/dL — ABNORMAL HIGH (ref 0.61–1.24)
Creatinine, Ser: 1.63 mg/dL — ABNORMAL HIGH (ref 0.61–1.24)
GFR, Estimated: 52 mL/min — ABNORMAL LOW (ref >60)
GFR, Estimated: 51 mL/min — ABNORMAL LOW (ref >60)
Glucose, Bld: 169 mg/dL — ABNORMAL HIGH (ref 70–99)
Glucose, Bld: 268 mg/dL — ABNORMAL HIGH (ref 70–99)
Phosphorus: 2.3 mg/dL — ABNORMAL LOW (ref 2.5–4.6)
Phosphorus: 2.9 mg/dL (ref 2.5–4.6)
Potassium: 3.7 mmol/L (ref 3.5–5.1)
Potassium: 4.2 mmol/L (ref 3.5–5.1)
Sodium: 133 mmol/L — ABNORMAL LOW (ref 135–145)
Sodium: 133 mmol/L — ABNORMAL LOW (ref 135–145)

## 2020-01-27 LAB — GLUCOSE, CAPILLARY
Glucose-Capillary: 126 mg/dL — ABNORMAL HIGH (ref 70–99)
Glucose-Capillary: 144 mg/dL — ABNORMAL HIGH (ref 70–99)
Glucose-Capillary: 148 mg/dL — ABNORMAL HIGH (ref 70–99)
Glucose-Capillary: 152 mg/dL — ABNORMAL HIGH (ref 70–99)
Glucose-Capillary: 155 mg/dL — ABNORMAL HIGH (ref 70–99)
Glucose-Capillary: 158 mg/dL — ABNORMAL HIGH (ref 70–99)
Glucose-Capillary: 162 mg/dL — ABNORMAL HIGH (ref 70–99)
Glucose-Capillary: 166 mg/dL — ABNORMAL HIGH (ref 70–99)
Glucose-Capillary: 167 mg/dL — ABNORMAL HIGH (ref 70–99)
Glucose-Capillary: 180 mg/dL — ABNORMAL HIGH (ref 70–99)
Glucose-Capillary: 234 mg/dL — ABNORMAL HIGH (ref 70–99)
Glucose-Capillary: 271 mg/dL — ABNORMAL HIGH (ref 70–99)
Glucose-Capillary: 82 mg/dL (ref 70–99)

## 2020-01-27 LAB — POCT I-STAT 7, (LYTES, BLD GAS, ICA,H+H)
Acid-Base Excess: 0 mmol/L (ref 0.0–2.0)
Bicarbonate: 25.1 mmol/L (ref 20.0–28.0)
Calcium, Ion: 1.16 mmol/L (ref 1.15–1.40)
HCT: 26 % — ABNORMAL LOW (ref 39.0–52.0)
Hemoglobin: 8.8 g/dL — ABNORMAL LOW (ref 13.0–17.0)
O2 Saturation: 98 %
Patient temperature: 98.7
Potassium: 3.8 mmol/L (ref 3.5–5.1)
Sodium: 136 mmol/L (ref 135–145)
TCO2: 26 mmol/L (ref 22–32)
pCO2 arterial: 41.3 mmHg (ref 32.0–48.0)
pH, Arterial: 7.392 (ref 7.350–7.450)
pO2, Arterial: 103 mmHg (ref 83.0–108.0)

## 2020-01-27 LAB — CBC WITH DIFFERENTIAL/PLATELET
Abs Immature Granulocytes: 0.19 10*3/uL — ABNORMAL HIGH (ref 0.00–0.07)
Basophils Absolute: 0.1 10*3/uL (ref 0.0–0.1)
Basophils Relative: 0 %
Eosinophils Absolute: 0.5 10*3/uL (ref 0.0–0.5)
Eosinophils Relative: 4 %
HCT: 27.8 % — ABNORMAL LOW (ref 39.0–52.0)
Hemoglobin: 8.3 g/dL — ABNORMAL LOW (ref 13.0–17.0)
Immature Granulocytes: 1 %
Lymphocytes Relative: 4 %
Lymphs Abs: 0.6 10*3/uL — ABNORMAL LOW (ref 0.7–4.0)
MCH: 27.6 pg (ref 26.0–34.0)
MCHC: 29.9 g/dL — ABNORMAL LOW (ref 30.0–36.0)
MCV: 92.4 fL (ref 80.0–100.0)
Monocytes Absolute: 1.1 10*3/uL — ABNORMAL HIGH (ref 0.1–1.0)
Monocytes Relative: 8 %
Neutro Abs: 11.9 10*3/uL — ABNORMAL HIGH (ref 1.7–7.7)
Neutrophils Relative %: 83 %
Platelets: 222 10*3/uL (ref 150–400)
RBC: 3.01 MIL/uL — ABNORMAL LOW (ref 4.22–5.81)
RDW: 20.2 % — ABNORMAL HIGH (ref 11.5–15.5)
WBC: 14.4 10*3/uL — ABNORMAL HIGH (ref 4.0–10.5)
nRBC: 0.3 % — ABNORMAL HIGH (ref 0.0–0.2)

## 2020-01-27 LAB — HEPARIN LEVEL (UNFRACTIONATED)
Heparin Unfractionated: <0.1 IU/mL — ABNORMAL LOW (ref 0.30–0.70)
Heparin Unfractionated: <0.1 IU/mL — ABNORMAL LOW (ref 0.30–0.70)

## 2020-01-27 LAB — MAGNESIUM: Magnesium: 2.4 mg/dL (ref 1.7–2.4)

## 2020-01-27 LAB — VANCOMYCIN, TROUGH: Vancomycin Tr: 20 ug/mL (ref 15–20)

## 2020-01-27 LAB — CORTISOL-AM, BLOOD: Cortisol - AM: 17.6 ug/dL (ref 6.7–22.6)

## 2020-01-27 LAB — COOXEMETRY PANEL
Carboxyhemoglobin: 1.8 % — ABNORMAL HIGH (ref 0.5–1.5)
Methemoglobin: 1.1 % (ref 0.0–1.5)
O2 Saturation: 62.7 %
Total hemoglobin: 7.7 g/dL — ABNORMAL LOW (ref 12.0–16.0)

## 2020-01-27 LAB — LACTATE DEHYDROGENASE: LDH: 377 U/L — ABNORMAL HIGH (ref 98–192)

## 2020-01-27 MED ORDER — THIAMINE HCL 100 MG PO TABS: 500.0000 mg | ORAL_TABLET | Freq: Once | ORAL | Status: DC

## 2020-01-27 MED ORDER — INSULIN ASPART 100 UNIT/ML ~~LOC~~ SOLN
0.0000 [IU] | SUBCUTANEOUS | Status: DC
  Administered 2020-01-27: 20:00:00 5 [IU] via SUBCUTANEOUS
  Administered 2020-01-27: 16:00:00 8 [IU] via SUBCUTANEOUS
  Administered 2020-01-27: 13:00:00 3 [IU] via SUBCUTANEOUS
  Administered 2020-01-28: 5 [IU] via SUBCUTANEOUS
  Administered 2020-01-28 (×3): 8 [IU] via SUBCUTANEOUS
  Administered 2020-01-28 – 2020-01-29 (×3): 5 [IU] via SUBCUTANEOUS
  Administered 2020-01-29 (×3): 8 [IU] via SUBCUTANEOUS
  Administered 2020-01-29 (×3): 5 [IU] via SUBCUTANEOUS
  Administered 2020-01-30: 3 [IU] via SUBCUTANEOUS
  Administered 2020-01-30 (×2): 8 [IU] via SUBCUTANEOUS
  Administered 2020-01-30 (×3): 5 [IU] via SUBCUTANEOUS
  Administered 2020-01-31: 8 [IU] via SUBCUTANEOUS
  Administered 2020-01-31: 5 [IU] via SUBCUTANEOUS

## 2020-01-27 MED ORDER — MIDODRINE HCL 5 MG PO TABS
10.0000 mg | ORAL_TABLET | Freq: Three times a day (TID) | ORAL | Status: DC
  Administered 2020-01-27 – 2020-01-29 (×6): 10 mg
  Filled 2020-01-27 (×6): qty 2

## 2020-01-27 MED ORDER — ASCORBIC ACID 500 MG PO TABS
500.0000 mg | ORAL_TABLET | Freq: Three times a day (TID) | ORAL | Status: DC
  Administered 2020-01-27 – 2020-03-07 (×118): 500 mg
  Filled 2020-01-27 (×124): qty 1

## 2020-01-27 MED ORDER — VANCOMYCIN HCL IN DEXTROSE 1-5 GM/200ML-% IV SOLN: 1000.0000 mg | INTRAVENOUS | Status: DC

## 2020-01-27 MED ORDER — OXYCODONE HCL 5 MG PO TABS
5.0000 mg | ORAL_TABLET | Freq: Three times a day (TID) | ORAL | Status: DC
Start: 2020-01-27 — End: 2020-01-30
  Administered 2020-01-27 – 2020-01-30 (×9): 5 mg
  Filled 2020-01-27 (×9): qty 1

## 2020-01-27 MED ORDER — INSULIN DETEMIR 100 UNIT/ML ~~LOC~~ SOLN
10.0000 [IU] | Freq: Two times a day (BID) | SUBCUTANEOUS | Status: DC
  Administered 2020-01-27 – 2020-01-30 (×8): 10 [IU] via SUBCUTANEOUS
  Filled 2020-01-27 (×10): qty 0.1

## 2020-01-27 MED ORDER — THIAMINE HCL 100 MG PO TABS: 100.0000 mg | ORAL_TABLET | Freq: Every day | ORAL | Status: DC

## 2020-01-27 MED ORDER — POTASSIUM PHOSPHATES 15 MMOLE/5ML IV SOLN
20.0000 mmol | Freq: Once | INTRAVENOUS | Status: AC
  Administered 2020-01-27: 09:00:00 20 mmol via INTRAVENOUS
  Filled 2020-01-27: qty 6.67

## 2020-01-27 MED ORDER — MELATONIN 5 MG PO TABS
5.0000 mg | ORAL_TABLET | Freq: Every day | ORAL | Status: DC
  Administered 2020-01-27 – 2020-03-07 (×38): 5 mg
  Filled 2020-01-27 (×37): qty 1

## 2020-01-27 MED ORDER — PROSOURCE TF PO LIQD
45.0000 mL | Freq: Three times a day (TID) | ORAL | Status: DC
  Administered 2020-01-27 – 2020-02-20 (×66): 45 mL
  Filled 2020-01-27 (×66): qty 45

## 2020-01-27 MED ORDER — RENA-VITE PO TABS: 1.0000 | ORAL_TABLET | Freq: Every day | ORAL | Status: DC

## 2020-01-27 MED ORDER — PIVOT 1.5 CAL PO LIQD
1000.0000 mL | ORAL | Status: DC
  Administered 2020-01-27 – 2020-02-20 (×19): 1000 mL
  Filled 2020-01-27 (×2): qty 1000

## 2020-01-27 MED ORDER — THIAMINE HCL 100 MG PO TABS
100.0000 mg | ORAL_TABLET | Freq: Every day | ORAL | Status: DC
  Administered 2020-01-28 – 2020-03-07 (×40): 100 mg
  Filled 2020-01-27 (×40): qty 1

## 2020-01-27 MED ORDER — RENA-VITE PO TABS
1.0000 | ORAL_TABLET | Freq: Every day | ORAL | Status: DC
  Administered 2020-01-27 – 2020-03-07 (×39): 1
  Filled 2020-01-27 (×39): qty 1

## 2020-01-27 MED ORDER — QUETIAPINE FUMARATE 25 MG PO TABS
25.0000 mg | ORAL_TABLET | Freq: Every day | ORAL | Status: DC
  Administered 2020-01-27 – 2020-01-28 (×2): 25 mg
  Filled 2020-01-27 (×2): qty 1

## 2020-01-27 MED ORDER — THIAMINE HCL 100 MG PO TABS
500.0000 mg | ORAL_TABLET | Freq: Once | ORAL | Status: AC
  Administered 2020-01-27: 16:00:00 500 mg
  Filled 2020-01-27: qty 5

## 2020-01-27 NOTE — Progress Notes (Signed)
Received diabetes coordinator consult for insulin recs.   Noted that patient has new orders for Levemir 10 units BID, Novolog MODERATE every 4 hours. Agree with orders. Will follow while in the hospital.   Harvel Ricks RN BSN CDE Diabetes Coordinator Pager: [phone]  8am-5pm

## 2020-01-27 NOTE — Progress Notes (Signed)
NAME:  Corey Palmer, MRN:  [id number], DOB:  May 04, 1970, LOS: 38 ADMISSION DATE:  01/10/2020, CONSULTATION DATE: 01/26/2020 REFERRING MD: Aundra Dubin, CHIEF COMPLAINT: Respiratory failure post Impella  HPI/course in hospital  49 year old man admitted with A. fib/RVR, torsades, cardiogenic shock [EF 15] requiring Impella, CABG x5, AKI requiring initiation of CRRT  Past Medical History:    has a past medical history of Anginal pain ([place]), Anxiety, Arthritis, Automatic implantable cardioverter-defibrillator in situ (2007), Bipolar disorder ([place]), CAD (coronary artery disease), Cancer ([place]) (2013), CHF (congestive heart failure) ([place]), Chondrocalcinosis of right knee (8/75/6433), Chronic systolic heart failure ([place]), Depression, Diabetes uncomplicated adult-type II, Dilated cardiomyopathy ([place]), Dyslipidemia, Dysrhythmia, GERD (gastroesophageal reflux disease), Gout, unspecified, Hepatic steatosis (2011), History of kidney stones, echocardiogram, Hypertension, Obesity (BMI 30.0-34.9), Pacemaker, Paroxysmal atrial fibrillation ([place]), Peptic ulcer, Shortness of breath, and Sleep apnea.  Significant Hospital Events:  12/2 Afib with RVR. Cardioverted in ED. AICD did not fire. Milrinone for low co ox.  12/7 ICD fired, Torsades- likely from low K of 2.8.  12/8 Cath- multivessel disease w/ low CO. Volume overloaded.  12/11 Underwent Impella 5.5 insertion for persistent symptoms of forward failure with low cardiac indices. 12/12 Back to the OR for displacement of Impella. Extubated.  12/15 CABG x4, with LIMA-LAD, SVG-PDA/PLV, SVG-OM 12/16 Emergently opened chest at bedside for tamponade, clot compressing right atrium  12/16 To OR for Exploration of chest due to bleeding  12/19 Started CRRT 12/23 Sternal Closure in OR 12/24 Worsening shock.  Chest reopened at bedside with improvement in hemodynamics.  No mediastinal hematoma. 12/25 A flutter > rapid a paced to sinus rhythm.  Remains on high-dose pressors,  inotropes 12/28 Weaning vasopressor requirements.  For chest washout today.  Back in atrial fibrillation, refractory to increased amiodarone and attempted overdrive pacing.  DCCV to NSR. Tolerating fluid removal via CRRT.  Consults:  PCCM, nephrology, cardiology, cardiothoracic surgery  Procedures:  12/2 R PICC >> 12/11 right ax impella 5.5 >> 12/15 ETT >> 12/15 right femoral arterial sheath >> 12/19 right femoral HD >> 12/23 chest tube-right pleural, left pleural , mediastinal  >>  Significant Diagnostic Tests:    Micro Data:  Blood culture 12/14 > negative  Blood culture 12/16 > negative Respiratory culture 12/20 > rare candida albicans Sternal wound culture 12/23 > no growth Fungal cx sternal wound 12/23 >> neg Fungal cx w/stain sternal wound 12/23 >> AFB sternal wound 12/23 >>   Antimicrobials:  Vancomycin 12/16 >> Meropenem 12/16 >>  Interval history:  [place] 62.7  Remains on NO, NE, Epi  Impella at P8   Remains at insulin gtt-- no clear plans for closing chest  Objective   Blood pressure (!) 85/70, pulse 96, temperature 98 F (36.7 C), resp. rate (!) 28, height 6\' 3"  (1.905 m), weight 108.2 kg, SpO2 100 %. PAP: (23-34)/(16-26) 28/18 CVP:  [7 mmHg-23 mmHg] 10 mmHg CO:  [4.7 L/min-5.5 L/min] 5.3 L/min CI:  [2 L/min/m2-2.4 L/min/m2] 2.3 L/min/m2  Vent Mode: PRVC FiO2 (%):  [40 %] 40 % Set Rate:  [28 bmp] 28 bmp Vt Set:  [51 mL-510 mL] 51 mL PEEP:  [5 cmH20] 5 cmH20 Plateau Pressure:  [20 cmH20-29 cmH20] 20 cmH20   Intake/Output Summary (Last 24 hours) at 01/27/2020 0823 Last data filed at 01/27/2020 0700 Gross per 24 hour  Intake 5089.73 ml  Output 7321 ml  Net -2231.27 ml   Filed Weights   01/02/2020 0400 01/26/20 0500 01/27/20 0500  Weight: 109.9 kg 109 kg 108.2 kg  PAP: (23-34)/(16-26) 28/18 CVP:  [7 mmHg-23 mmHg] 10 mmHg CO:  [4.7 L/min-5.5 L/min] 5.3 L/min CI:  [2 L/min/m2-2.4 L/min/m2] 2.3 L/min/m2  Examination: General:  Critically ill  appearing middle aged M intubated sedated on CRRT and impella NAD  HEENT: [place]. ETT secure. Anicteric sclera.  Neuro: Opens eyes spontaneously, following simple commands. PERRL CV: RR cap refill <3 sec, open chest  PULM:  Symmetrical chest expansion. R sided expiratory wheeze. Mechanically ventilated  GI: Obese soft round ndnt + bowel sounds  Extremities: No obvious joint deformity, no clubbing. Pitting edema BUE BLE Skin: c/d/w no rash    Assessment & Plan:   Acute encephalopathy -CNS depressing meds, prolonged ICU course, renal failure  P -RASS goal  -3 with open chest -will schedule oxy -- hope this might help offset fent gtt dose -started low dose seroquel 12/29 -- need to closely monitor qtc -will add melatonin   Acute hypoxic respiratory failure  - continue full MV support, PRVC 6 cc/kg, rate 28 - ABG reviewed this morning, 7.384/ 42/ 93/ 25.4 - plans for trach concurrently with full chest closure  - VAP bundle/ PPI - intermittent CXR   Cardiogenic shock  Acute on chronic systolic HF -IABP, inotropes, pressors  CAD s/p CABG, c/b cardiac tamponade, hematoma evac. Chest remains open  P - continue to slowly wean vasopressors. On Epi, NE, vaso.  - Impella at P8  -heparin pwe pharmacist dosing - Plan to wean off NO 12/30 - ongoing CRRT  - chest tubes per TCTS  - continue ASA, atorvastatin - continue empiric abx vanc/ meropenem (plan to cont until chest closed) - follow wound cultures  Afib has been refractory to pacing/ meds, s/p DCCV 12/28  P - Amio gtt per HF  - heparin  DM2 with hyperglycemia Previously, plan was to maintain insulin gtt until chest closed. No clear plans for chest closure however P -will try to transition off on insulin gtt to basal + SSI (levemir 10units BID + moderate SSI) -DM coordinator consult   Acute kidney injury with anuria -improving cardiorenal syndrome  P - remains on CRRT, goal neg 50-100/ hr - trend renal indices - prn bladder  scan   Acute blood loss anemia - trend CBC - transfuse for Hgb < 8  Daily Goals Checklist  Pain/Anxiety/Delirium protocol (if indicated): fent gtt PRN versed [facility] oxy VAP protocol (if indicated): bundle in place. Respiratory support goals: full ventilatory support. Wean once chest closed.  Blood pressure target: MAP>65 DVT prophylaxis:  Heparin Nutrition Status: high nutritional risk. EN per [place]. GI prophylaxis: pantoprazole Fluid status goals: CRRT to net UF -50 to -141ml/h Urinary catheter: n/a Central lines: PICC, PAC, HD catheter. Glucose control: Levemir 10 units BID, Moderate SSI  Mobility/therapy needs: bed rest  Antibiotic de-escalation: continue current antibiotics until chest closed. Daily labs: CBC, renal function, cooximetry Code Status: full  Family Communication: wife updated 12/20 Disposition: ICU   Critical care time:   CRITICAL CARE Performed by: Cristal Generous   Total critical care time: 50 minutes  Critical care time was exclusive of separately billable procedures and treating other patients. Critical care was necessary to treat or prevent imminent or life-threatening deterioration.  Critical care was time spent personally by me on the following activities: development of treatment plan with patient and/or surrogate as well as nursing, discussions with consultants, evaluation of patient's response to treatment, examination of patient, obtaining history from patient or surrogate, ordering and performing treatments and interventions, ordering and  review of laboratory studies, ordering and review of radiographic studies, pulse oximetry and re-evaluation of patient's condition.   Eliseo Gum MSN, AGACNP-BC [place] [phone] If no answer, [phone] 01/27/2020, 8:24 AM

## 2020-01-27 NOTE — Progress Notes (Addendum)
Patient ID: Corey Palmer, male   DOB: 12/13/1970, 49 y.o.   MRN: [id number]     Advanced Heart Failure Rounding Note  PCP-Cardiologist: No primary care provider on file.   Subjective:    - 12/7 Torsades -ICD shock x1.  - 12/11 Impella 5.5 placed.  - 12/12 Impella repositioned in OR - 12/15 CABG x 4 with LIMA-LAD, SVG-PDA/PLV, SVG-OM - 12/16 DCCV afib.  Back to OR to re-open chest and again to evacuate hematoma.  - 12/19 CVVH initiated  - 12/23 Chest closed - 12/24 Chest reopened - 12/26 RAP back into NSR - 12/27 back in atrial fibrillation, unable to rapid atrial pace out.   - 12/28 To OR, chest partially closed and wound vac placed.  DCCV to NSR.   On NE 11, Epi 3, VP 0.04, milrinone 0.125, iNO 10 and Impella.     CVVH running net 100 cc/hr negative.  Weight down another 2 lbs.    Remains on vancomycin/meropenem with open chest.  PAP: (22-34)/(14-26) 28/18 CVP:  [5 mmHg-23 mmHg] 10 mmHg CO:  [4.7 L/min-5.5 L/min] 5.3 L/min CI:  [2 L/min/m2-2.4 L/min/m2] 2.3 L/min/m2  Impella 5.5 P-8  Flow 4.3  Heparin only in purge Waveforms ok. No alarms LDH 357 => 349 => 333=>377  Echo (12/10): EF <20%, moderate LV dilation, normal RV size with mildly decreased systolic function.    Objective:   Weight Range: 108.2 kg Body mass index is 29.82 kg/m.   Vital Signs:   Temp:  [97.88 F (36.6 C)-99.7 F (37.6 C)] 98.1 F (36.7 C) (12/30 0700) Pulse Rate:  [87-117] 97 (12/30 0700) Resp:  [20-31] 28 (12/30 0700) BP: (84-85)/(57-74) 85/70 (12/30 0045) SpO2:  [94 %-100 %] 100 % (12/30 0700) Arterial Line BP: (75-255)/(56-251) 95/59 (12/30 0700) FiO2 (%):  [40 %] 40 % (12/30 0401) Weight:  [108.2 kg] 108.2 kg (12/30 0500) Last BM Date: 01/27/20  Weight change: Filed Weights   01/10/2020 0400 01/26/20 0500 01/27/20 0500  Weight: 109.9 kg 109 kg 108.2 kg    Intake/Output:   Intake/Output Summary (Last 24 hours) at 01/27/2020 0743 Last data filed at 01/27/2020 0700 Gross  per 24 hour  Intake 5261.85 ml  Output 7557 ml  Net -2295.15 ml      Physical Exam  General: Intubated . No resp difficulty HEENT: ETT Neck: supple. no JVD. Carotids 2+ bilat; no bruits. No lymphadenopathy or thryomegaly appreciated. RIJ  Cor: PMI nondisplaced. Regular rate & rhythm. No rubs, gallops or murmurs. Chest open with VAD. R axillary impella.  Lungs: clear Abdomen: soft, nontender, nondistended. No hepatosplenomegaly. No bruits or masses. Good bowel sounds. Extremities: no cyanosis, clubbing, rash, edema. RUE PICC  Neuro: intubated. Follows commands.   Telemetry  NSR 80s    Labs    CBC Recent Labs    01/26/20 0401 01/26/20 0446 01/27/20 0359 01/27/20 0451  WBC 18.1*  --  14.4*  --   NEUTROABS 15.5*  --  11.9*  --   HGB 8.7*   < > 8.3* 8.8*  HCT 27.1*   < > 27.8* 26.0*  MCV 90.6  --  92.4  --   PLT 262  --  222  --    < > = values in this interval not displayed.   Basic Metabolic Panel Recent Labs    01/26/20 0401 01/26/20 0446 01/26/20 1554 01/26/20 1556 01/27/20 0359 01/27/20 0451  NA 135   < > 136   < > 133* 136  K 3.8   < >  4.0   < > 3.7 3.8  CL 104  --  103  --  102  --   CO2 23  --  23  --  24  --   GLUCOSE 152*  --  133*  --  169*  --   BUN 16  --  19  --  21*  --   CREATININE 1.73*  --  1.70*  --  1.60*  --   CALCIUM 8.0*  --  8.0*  --  7.6*  --   MG 2.4  --   --   --  2.4  --   PHOS 2.1*  --  3.4  --  2.3*  --    < > = values in this interval not displayed.   Liver Function Tests Recent Labs    01/26/20 1554 01/27/20 0359  ALBUMIN 1.8* 1.8*   No results for input(s): LIPASE, AMYLASE in the last 72 hours. Cardiac Enzymes No results for input(s): CKTOTAL, CKMB, CKMBINDEX, TROPONINI in the last 72 hours.  BNP: BNP (last 3 results) Recent Labs    01/28/2020 1619  BNP 577.5*    ProBNP (last 3 results) No results for input(s): PROBNP in the last 8760 hours.   D-Dimer No results for input(s): DDIMER in the last 72  hours. Hemoglobin A1C No results for input(s): HGBA1C in the last 72 hours. Fasting Lipid Panel No results for input(s): CHOL, HDL, LDLCALC, TRIG, CHOLHDL, LDLDIRECT in the last 72 hours. Thyroid Function Tests No results for input(s): TSH, T4TOTAL, T3FREE, THYROIDAB in the last 72 hours.  Invalid input(s): FREET3  Other results:   Imaging    No results found.   Medications:     Scheduled Medications: . sodium chloride   Intravenous Once  . aspirin  324 mg Per Tube Daily  . atorvastatin  80 mg Per Tube Daily  . B-complex with vitamin C  1 tablet Per Tube Daily  . busPIRone  10 mg Per Tube TID  . chlorhexidine gluconate (MEDLINE KIT)  15 mL Mouth Rinse BID  . Chlorhexidine Gluconate Cloth  6 each Topical Daily  . darbepoetin (ARANESP) injection - NON-DIALYSIS  60 mcg Subcutaneous Q Sat-1800  . feeding supplement (PROSource TF)  45 mL Per Tube QID  . fiber  1 packet Per Tube BID  . FLUoxetine  40 mg Per Tube BID  . mouth rinse  15 mL Mouth Rinse 10 times per day  . nystatin  5 mL Oral QID  . pantoprazole sodium  40 mg Per Tube Daily  . QUEtiapine  25 mg Oral QHS  . sodium chloride flush  10-40 mL Intracatheter Q12H  . sodium chloride flush  3 mL Intravenous Q12H  . valproic acid  250 mg Per Tube BID    Infusions: .  prismasol BGK 4/2.5 500 mL/hr at 01/27/20 0714  .  prismasol BGK 4/2.5 200 mL/hr at 01/18/2020 2103  . sodium chloride Stopped (01/10/2020 1646)  . sodium chloride 10 mL/hr at 01/14/2020 1302  . sodium chloride Stopped (01/16/20 1032)  . sodium chloride Stopped (01/27/20 0109)  . amiodarone 30 mg/hr (01/27/20 0700)  . epinephrine 3 mcg/min (01/27/20 0700)  . feeding supplement (PIVOT 1.5 CAL) 40 mL/hr at 01/26/20 0801  . fentaNYL infusion INTRAVENOUS 150 mcg/hr (01/27/20 0700)  . heparin 300 Units/hr (01/27/20 0700)  . impella catheter heparin 50 unit/mL in dextrose 5% 9 mL/hr at 01/21/20 0015  . insulin 3 mL/hr at 01/27/20 0700  . lactated ringers  20  mL/hr at 01/27/20 0700  . meropenem (MERREM) IV Stopped (01/27/20 0239)  . midazolam Stopped (01/27/20 0509)  . milrinone 0.125 mcg/kg/min (01/27/20 0700)  . norepinephrine (LEVOPHED) Adult infusion 11 mcg/min (01/27/20 0700)  . prismasol BGK 4/2.5 1,500 mL/hr at 01/27/20 0132  . vancomycin Stopped (01/26/20 1458)  . vasopressin 0.04 Units/min (01/27/20 0700)    PRN Medications: sodium chloride, sodium chloride, sodium chloride, artificial tears, bisacodyl **OR** bisacodyl, dextrose, docusate, fentaNYL, fentaNYL (SUBLIMAZE) injection, fentaNYL (SUBLIMAZE) injection, heparin, heparin, metoprolol tartrate, midazolam, midazolam, midazolam, ondansetron (ZOFRAN) IV, oxyCODONE, polyethylene glycol, sodium chloride flush, sodium chloride flush     Assessment/Plan   1. Cardiogenic shock/acute on chronic systolic CHF: Initially nonischemic cardiomyopathy. Possible familial cardiomyopathy as both parents had cardiomyopathy and died at around 79.However, [employer] gene testing did not show any common mutation for cardiomyopathy.  However, this admission noted to have severe 3 vessel disease so suspect component of ischemic cardiomyopathy. St Jude ICD. Echo in 8/20 with EF 15% and mildly decreased RV function. Echo this admission with EF < 20%, moderate LV dilation, RV mildly reduced, severe LAE, no significant MR.Low output HF with markedly low EF.  He has a long history of cardiomyopathy (20 yrs), tends to minimize symptoms.  Cardiorenal syndrome with creatinine up to 2.2,  stabilized on milrinone and Impella 5.5. SCr 1.44 day of CABG. CABG 12/15.  Post-op shock, back to OR 12/16 to open chest (chest wall compartment syndrome) and again to evacuate hematoma.  TEE post-op with severe RV dysfunction.  CVVHD initiated 12/19 for volume removal in setting of rising Scr/decreased UOP. Suspect ATN. S/p chest closure 12/23. Hemodynamics much worse on 12/24 with rising pressor demands. Co-ox 36%.  Bedside echo  severe biventricular dysfunction with marked septal bounce. Chest re-opened 12/24.  Was atrial paced back to NSR on 12/26 with improvement in hemodynamics but back in atrial fibrillation on 12/27 and unable to pace out, DCCV to NSR on 12/28. Remains on prssors (NE 11, Epi 3, VP 0.04, milrinone 0.125) and NO 10  ppm.  - Wean pressors slowly today for MAP 65.   - Continue milrinone 0.125.  - Aim for CVVH net negative 50 hr today.    - Continue  Impella to P8 to facilitate weaning of vasoactive meds.  LDH stable.  2. Atrial fibrillation: H/o PAF.  He was on dofetilide in the remote past but this was stopped due to noncompliance. He had an upper GI bleed from antral ulcers in 3/12. He was seen by GI and was deemed safe to restart anticoagulation as long as he remains on a PPI.  No apparent recurrence of AF until just prior to this admission, was cardioverted back to NSR in ER but went back into atrial fibrillation.  Post-op afib, DCCV to NSR/BiV pacing am 12/16 -> back to AF. Rapid atrial pacing back to SR on 12/26 -> back in AF on 12/27 and unable to pace out. 12/28 DCCV to NSR, NSR today.  - Continue IV amiodarone 30 mg/hr.  - He is now back on heparin gtt.  - Did not have Maze with CABG.    3. CAD:   Cath this admission with severe 3 vessel disease. CABG x 4 on 12/15.  - Continue atorvastatin, ASA.  - continue current management 4. Acute on chronic hypoxemic respiratory failure: Vent per CCM.  - CCM following - Will need tracheostomy when chest fully closed.  5. AKI on CKD stage 3: Likely ATN/cardiorenal.  On CVVH.  CVP down to 8-9  - Aim for net negative 50cc/hr today.  6. Diabetes: Insulin.  7. Hyponatremia: Resolved.  8. Torsades/ICD shock: 12/7/212 in hospital event, in setting of severe hypokalemia and hypomagnesemia.   9. Gout: h/o gout. Complained of rt knee pain c/w previous flares - treated w/ prednisone burst (completed).  10: ID: Treating for PNA and also with chest wall open.   Check CBC - On vancomycin/meropenem (PCN allergy).  - can add fungal coverage as needed 11. FEN: TFs ongoing.   Amy Clegg NP-C  01/27/2020 7:43 AM  Patient seen with NP, agree with the above note.   Impella stable at P8, no alarms.  LDH stable.  He is in NSR.  Now on epinephrine 3, NE 11, vasopressin 0.04, milrinone 0.125.  Pulling UF net negative 100 cc/hr, weight down 2 lbs.  CVP 8 today. Ci 2.3, co-ox 63%.   He will wake up and follow commands with sedation wean.   General: NAD, intubated Neck: JVP 8-9 cm, no thyromegaly or thyroid nodule.  Lungs: Clear to auscultation bilaterally with normal respiratory effort. CV: Wound vac on chest. Heart regular S1/S2, no S3/S4, no murmur.  No edema. Abdomen: Soft, nontender, no hepatosplenomegaly, no distention.  Skin: Intact without lesions or rashes.  Neurologic: Wakes up with sedation wean.  Extremities: No clubbing or cyanosis.  HEENT: Normal.   Continue slow wean of pressors today => decrease NE and epinephrine.  Wean off NO today.  Continue Impella at P8, stable without alarms.   Volume status improving, decrease UF via CVVH to net negative 50 cc/hr.    Wound vac change tomorrow, continue vancomycin/meropenem while chest open.   He remains in NSR on amiodarone gtt, continue. He is now on heparin gtt.   CRITICAL CARE Performed by: Loralie Champagne  Total critical care time: 40 minutes  Critical care time was exclusive of separately billable procedures and treating other patients.  Critical care was necessary to treat or prevent imminent or life-threatening deterioration.  Critical care was time spent personally by me on the following activities: development of treatment plan with patient and/or surrogate as well as nursing, discussions with consultants, evaluation of patient's response to treatment, examination of patient, obtaining history from patient or surrogate, ordering and performing treatments and interventions, ordering  and review of laboratory studies, ordering and review of radiographic studies, pulse oximetry and re-evaluation of patient's condition.  Loralie Champagne 01/27/2020 8:16 AM

## 2020-01-27 NOTE — Progress Notes (Signed)
2 Days Post-Op Procedure(s) (LRB): STERNAL WASH OUT (N/A) APPLICATION OF WOUND VAC (N/A) Subjective: sedated  Objective: Vital signs in last 24 hours: Temp:  [97.88 F (36.6 C)-99.7 F (37.6 C)] 98.3 F (36.8 C) (12/30 0600) Pulse Rate:  [84-117] 96 (12/30 0600) Cardiac Rhythm: Normal sinus rhythm (12/30 0400) Resp:  [5-31] 28 (12/30 0600) BP: (84-85)/(57-74) 85/70 (12/30 0045) SpO2:  [94 %-100 %] 100 % (12/30 0600) Arterial Line BP: (75-255)/(56-251) 129/56 (12/30 0600) FiO2 (%):  [40 %] 40 % (12/30 0401) Weight:  [108.2 kg] 108.2 kg (12/30 0500)  Hemodynamic parameters for last 24 hours: PAP: (22-34)/(14-26) 28/19 CVP:  [5 mmHg-23 mmHg] 11 mmHg CO:  [4.7 L/min-5.7 L/min] 5.3 L/min CI:  [2 L/min/m2-2.5 L/min/m2] 2.3 L/min/m2  Intake/Output from previous day: 12/29 0701 - 12/30 0700 In: 5100.2 [I.V.:2720.8; NG/GT:1084.8; IV Piggyback:1087.2] Out: 1478 [Drains:95; Stool:225; Chest Tube:30] Intake/Output this shift: Total I/O In: 2031.4 [I.V.:1218.2; Other:98.2; NG/GT:615; IV Piggyback:100] Out: 3426 [Drains:45; GNFAO:1308; Stool:225; Chest Tube:10]  General appearance: intubated/ sedated Neurologic: responding to voice, following commands, by report Heart: regular rate and rhythm, S1, S2 normal, no murmur, click, rub or gallop Lungs: clear to auscultation bilaterally Abdomen: soft, non-tender; bowel sounds normal; no masses,  no organomegaly Extremities: edema 2+ Wound: dressing intact  Lab Results: Recent Labs    01/26/20 0401 01/26/20 0446 01/27/20 0359 01/27/20 0451  WBC 18.1*  --  14.4*  --   HGB 8.7*   < > 8.3* 8.8*  HCT 27.1*   < > 27.8* 26.0*  PLT 262  --  222  --    < > = values in this interval not displayed.   BMET:  Recent Labs    01/26/20 1554 01/26/20 1556 01/27/20 0359 01/27/20 0451  NA 136   < > 133* 136  K 4.0   < > 3.7 3.8  CL 103  --  102  --   CO2 23  --  24  --   GLUCOSE 133*  --  169*  --   BUN 19  --  21*  --   CREATININE  1.70*  --  1.60*  --   CALCIUM 8.0*  --  7.6*  --    < > = values in this interval not displayed.    PT/INR: No results for input(s): LABPROT, INR in the last 72 hours. ABG    Component Value Date/Time   PHART 7.392 01/27/2020 0451   HCO3 25.1 01/27/2020 0451   TCO2 26 01/27/2020 0451   ACIDBASEDEF 2.0 01/02/2020 1555   O2SAT 62.7 01/27/2020 0459   CBG (last 3)  Recent Labs    01/27/20 0357 01/27/20 0508 01/27/20 0601  GLUCAP 162* 158* 144*    Assessment/Plan: S/P Procedure(s) (LRB): STERNAL WASH OUT (N/A) APPLICATION OF WOUND VAC (N/A) continue removing fluid,   Continue drip wean Consider line holiday  Change wound vac dressing in next 24 hours    LOS: 28 days    [name] 01/27/2020

## 2020-01-27 NOTE — Progress Notes (Signed)
Nutrition Follow-up  DOCUMENTATION CODES:   Not applicable  INTERVENTION:   Tube Feeding via Cortrak:  -Increase Pivot 1.5 to 70 ml/hr via Cortrak -Pro-Source TF 45 ml TID  Provides 191 g of protein, 2640 kcals and 1276 mL of free water  Continue Renal MVI  NUTRITION DIAGNOSIS:   Inadequate oral intake related to acute illness as evidenced by NPO status.  Being addressed via TF  GOAL:   Patient will meet greater than or equal to 90% of their needs  Progressing  MONITOR:   Weight trends,Vent status  REASON FOR ASSESSMENT:   Ventilator    ASSESSMENT:   49 yo male admitted with Afib with RVR requiring cardioversion, ischemic CM requiring inotropes and impella support and ultimately underwent CABG x 4 on 12/15 with Impella still in place. PMH includes CAD, benign stomach cancer, CHF, DM, depression, CM, GERD, HTN  12/11 Impella LVAD placed 12/15 Redo Sternotomy, CABG on pump x 4, Intubated 12/16 Bedside chest exploration for tamponade with moderate amount of anterior mediastinal clot removed, OR for bleeding s/p CABG for mediastinal washout 12/17 Cortrak placed-duodenal  12/19 CRRT initiated 12/23 Sternal closure  12/24 Worsening shock, Chest reopened at bedside 12/28 OR-[place], Wound VAC  Noted plan to change VAC in next 24 hours  Pt remains sedated on vent support; currently requiring multiple pressors Pt remains on CRRT  Tolerating pivot 1.5 at 50, goal 65 ml/hr. Titrating per orders. Nutrisource fiber BID per tube. Pro-Source TF 45 mL QID  No pressure injuries noted per RN skin assessment  Labs: reviewed Meds: ss novolog, levemir, Rena-Vit, Vit C ,thiamine  Diet Order:   Diet Order            Diet NPO time specified Except for: Sips with Meds  Diet effective midnight                 EDUCATION NEEDS:   Not appropriate for education at this time  Skin:  Skin Assessment: Skin Integrity Issues: Skin Integrity Issues:: Incisions,Other  (Comment) Incisions: stermun, chest tubes x 4 Other: venous stasis ulcers  Last BM:  12/27 via rectal tube  Height:   Ht Readings from Last 1 Encounters:  01/25/2020 6\' 3"  (1.905 m)    Weight:   Wt Readings from Last 1 Encounters:  01/27/20 108.2 kg    Ideal Body Weight:  89 kg  BMI:  Body mass index is 29.82 kg/m.  Estimated Nutritional Needs:   Kcal:  2300-2500 kcal  Protein:  180-200 grams  Fluid:  >/= 1.5 L   Kerman Passey MS, RDN, LDN, CNSC Registered Dietitian III Clinical Nutrition RD Pager and On-Call Pager Number Located in [place]

## 2020-01-27 NOTE — Progress Notes (Addendum)
[facility] NEPHROLOGY PROGRESS NOTE  Assessment/ Plan:  #AKI/CKD stage III- presumably due to ischemic ATN in setting of cardiogenic shock s/p CABG.  CRRT started 01/16/20. All fluids 4K/2.5Ca: Pre-filter 500 ml/hr, post filter 200 ml/hr, dialysate 1500 ml/hr. No heparin due to hematoma. Right femoral HD catheter placed 01/16/20. -Tolerating CRRT well, no problem with filter.  CVP improved therefore lowering UF goal to 50 cc/hour.  Discussed with heart failure team and nurse. Continue current CRRT prescription.  # CAD s/p CABG complicated by cardiogenic shock and post-op hematoma. secondary closure on 12/23 then had to be re opened.  Went to the OR for washout on 12/28.  # Cardiogenic shock- underlying familial cardiomyopathy s/p redo CABG. Remains on pressors: Levophed, epinephrine, and vasopressin. Impella and milrinone.  Plan to wean pressors today.  # ABLA- s/p postoperative bleeding into chest cavity s/p re-exploration for tamponade/hematoma. CT surgery following.stable, added ESA.  #Hypophosphatemia- due to CRRT. Replete K phos.  # Atrial fibrillation - s/p DCCV on 12/16.  On amiodarone.  # VDRF- per primary svc.  #-Leukocytosis.On vancomycin and meropenem.   Per primary team.   Subjective: Seen and examined.  CVP around 8 this morning.  Remains sedated and intubated.  On pressors.  No other new event.  Discussed with CHF team.  Objective Vital signs in last 24 hours: Vitals:   01/27/20 0700 01/27/20 0715 01/27/20 0730 01/27/20 0745  BP:      Pulse: 97 95 99 96  Resp: (!) 28 (!) 28 (!) 28 (!) 28  Temp: 98.1 F (36.7 C) 98 F (36.7 C) 98 F (36.7 C) 98 F (36.7 C)  TempSrc:      SpO2: 100% 100% 100% 100%  Weight:      Height:       Weight change: -0.8 kg  Intake/Output Summary (Last 24 hours) at 01/27/2020 0814 Last data filed at 01/27/2020 0700 Gross per 24 hour  Intake 5089.73 ml  Output 7321 ml  Net -2231.27 ml        Labs: Basic Metabolic Panel: Recent Labs  Lab 01/26/20 0401 01/26/20 0446 01/26/20 1554 01/26/20 1556 01/27/20 0359 01/27/20 0451  NA 135   < > 136 137 133* 136  K 3.8   < > 4.0 4.0 3.7 3.8  CL 104  --  103  --  102  --   CO2 23  --  23  --  24  --   GLUCOSE 152*  --  133*  --  169*  --   BUN 16  --  19  --  21*  --   CREATININE 1.73*  --  1.70*  --  1.60*  --   CALCIUM 8.0*  --  8.0*  --  7.6*  --   PHOS 2.1*  --  3.4  --  2.3*  --    < > = values in this interval not displayed.   Liver Function Tests: Recent Labs  Lab 12/30/2019 1652 01/21/20 0410 01/21/20 1551 01/22/20 0319 01/22/20 1546 01/26/20 0401 01/26/20 1554 01/27/20 0359  AST 42* 39  --  36  --   --   --   --   ALT 76* 62*  --  45*  --   --   --   --   ALKPHOS 162* 134*  --  122  --   --   --   --   BILITOT 1.2 1.3*  --  1.1  --   --   --   --  PROT 6.0* 6.2*  --  5.5*  --   --   --   --   ALBUMIN 2.1* 2.3*   < > 2.0*   < > 1.8* 1.8* 1.8*   < > = values in this interval not displayed.   No results for input(s): LIPASE, AMYLASE in the last 168 hours. No results for input(s): AMMONIA in the last 168 hours. CBC: Recent Labs  Lab 01/24/20 1700 01/24/20 1903 01/25/2020 0415 01/23/2020 0416 12/31/2019 1548 01/02/2020 1555 01/26/20 0401 01/26/20 0446 01/26/20 1556 01/27/20 0359 01/27/20 0451  WBC 22.2*  --  21.3*  --  19.6*  --  18.1*  --   --  14.4*  --   NEUTROABS  --   --  18.7*  --   --   --  15.5*  --   --  11.9*  --   HGB 8.6*   < > 9.0*   < > 8.6*   < > 8.7*   < > 9.5* 8.3* 8.8*  HCT 27.7*   < > 27.9*   < > 28.1*   < > 27.1*   < > 28.0* 27.8* 26.0*  MCV 92.0  --  90.9  --  92.7  --  90.6  --   --  92.4  --   PLT 273  --  256  --  257  --  262  --   --  222  --    < > = values in this interval not displayed.   Cardiac Enzymes: No results for input(s): CKTOTAL, CKMB, CKMBINDEX, TROPONINI in the last 168 hours. CBG: Recent Labs  Lab 01/27/20 0305 01/27/20 0357 01/27/20 0508  01/27/20 0601 01/27/20 0701  GLUCAP 166* 162* 158* 144* 148*    Iron Studies: No results for input(s): IRON, TIBC, TRANSFERRIN, FERRITIN in the last 72 hours. Studies/Results: DG Chest Port 1 View  Result Date: 01/27/2020 CLINICAL DATA:  Hypoxia EXAM: PORTABLE CHEST 1 VIEW COMPARISON:  January 26, 2020 FINDINGS: Endotracheal tube tip is 4.6 cm above the carina. Swan-Ganz catheter tip is in the right main pulmonary artery. Impella device unchanged in position. Bilateral chest tubes present. Pacemaker lead attached to the right ventricle. No pneumothorax. There is mild bibasilar atelectasis. No edema or airspace opacity. Stable cardiomegaly with pulmonary vascularity within normal limits. No adenopathy. No bone lesions. IMPRESSION: Tube and catheter positions as described without pneumothorax. Stable cardiac prominence. Bibasilar atelectasis. No edema or consolidation. Electronically Signed   By: Lowella Grip III M.D.   On: 01/27/2020 07:55   DG CHEST PORT 1 VIEW  Result Date: 01/26/2020 CLINICAL DATA:  Hypoxia EXAM: PORTABLE CHEST 1 VIEW COMPARISON:  January 25, 2020 FINDINGS: Endotracheal tube tip is 3.8 cm above the carina. Swan-Ganz catheter tip is in the right main pulmonary artery. Enteric tube tip is below the diaphragm. Impella device unchanged in position. Right subclavian catheter tip in superior vena cava, stable. There is a left chest tube, unchanged in position. No pneumothorax. There is atelectatic change in the lung bases. No new opacity evident. There is cardiomegaly with pulmonary vascularity within normal limits. No adenopathy. Skin staples overlie the right axillary region. IMPRESSION: Tube and catheter positions as described without pneumothorax. Atelectasis in lower lung regions is stable. Stable cardiomegaly. Electronically Signed   By: Lowella Grip III M.D.   On: 01/26/2020 08:16   ECHOCARDIOGRAM LIMITED  Result Date: 12/30/2019    ECHOCARDIOGRAM LIMITED REPORT    [name]  [place] Date of Exam: 01/01/2020 Medical Rec #:  [id number]     Height:       75.0 in Accession #:    [id number]    Weight:       242.3 lb Date of Birth:  [birth year]     BSA:          2.381 m Patient Age:    49 years      BP:           0/0 mmHg Patient Gender: M             HR:           115 bpm. Exam Location:  [facility] Procedure: Limited Echo Indications:    CHF-Acute Systolic E95.28  History:        Patient has prior history of Echocardiogram examinations, most                 recent 01/19/2020. CHF, Defibrillator, Arrythmias:Atrial                 Fibrillation, Signs/Symptoms:Chest Pain; Risk                 Factors:Dyslipidemia, Diabetes and Hypertension. Cardiogenic                 Shock.  Sonographer:    Leavy Cella Referring Phys: Velma study for Impella cannula positioning, which is approximately 5 cm across the sortic valve. Severely depressed left ventricular function. Sanda Klein MD Electronically signed by Sanda Klein MD Signature Date/Time: 01/28/2020/10:15:28 AM    Final     Medications: Infusions: .  prismasol BGK 4/2.5 500 mL/hr at 01/27/20 0714  .  prismasol BGK 4/2.5 200 mL/hr at 01/04/2020 2103  . sodium chloride Stopped (01/19/2020 1646)  . sodium chloride 10 mL/hr at 01/11/2020 1302  . sodium chloride Stopped (01/16/20 1032)  . sodium chloride Stopped (01/27/20 0109)  . amiodarone 30 mg/hr (01/27/20 0700)  . epinephrine 3 mcg/min (01/27/20 0700)  . feeding supplement (PIVOT 1.5 CAL) 40 mL/hr at 01/26/20 0801  . fentaNYL infusion INTRAVENOUS 150 mcg/hr (01/27/20 0700)  . heparin 300 Units/hr (01/27/20 0700)  . impella catheter heparin 50 unit/mL in dextrose 5% 9 mL/hr at 01/21/20 0015  . insulin 3 mL/hr at 01/27/20 0700  . lactated ringers 20 mL/hr at 01/27/20 0700  . meropenem (MERREM) IV Stopped (01/27/20 0239)  . midazolam Stopped (01/27/20 0509)  . milrinone 0.125 mcg/kg/min (01/27/20 0700)  . norepinephrine (LEVOPHED)  Adult infusion 11 mcg/min (01/27/20 0700)  . prismasol BGK 4/2.5 1,500 mL/hr at 01/27/20 0132  . vancomycin Stopped (01/26/20 1458)  . vasopressin 0.04 Units/min (01/27/20 0700)    Scheduled Medications: . sodium chloride   Intravenous Once  . aspirin  324 mg Per Tube Daily  . atorvastatin  80 mg Per Tube Daily  . B-complex with vitamin C  1 tablet Per Tube Daily  . busPIRone  10 mg Per Tube TID  . chlorhexidine gluconate (MEDLINE KIT)  15 mL Mouth Rinse BID  . Chlorhexidine Gluconate Cloth  6 each Topical Daily  . darbepoetin (ARANESP) injection - NON-DIALYSIS  60 mcg Subcutaneous Q Sat-1800  . feeding supplement (PROSource TF)  45 mL Per Tube QID  . fiber  1 packet Per Tube BID  . FLUoxetine  40 mg Per Tube BID  . mouth rinse  15 mL Mouth Rinse 10 times per day  . nystatin  5 mL Oral QID  .  pantoprazole sodium  40 mg Per Tube Daily  . QUEtiapine  25 mg Oral QHS  . sodium chloride flush  10-40 mL Intracatheter Q12H  . sodium chloride flush  3 mL Intravenous Q12H  . valproic acid  250 mg Per Tube BID    have reviewed scheduled and prn medications.  Physical Exam: Unchanged General: Critically ill male, intubated, sedated, multiple IV lines and pressors. Heart:RRR, s1s2 nl Lungs open external wound.  Coarse breath sound. Abdomen:soft, non-distended Extremities: Bilateral lower extremity edema present, unchanged Dialysis Access: Right femoral HD catheter.  Jelissa Espiritu Prasad Bela Bonaparte 01/27/2020,8:14 AM  LOS: 28 days  Pager: [phone]

## 2020-01-27 NOTE — Progress Notes (Signed)
Pharmacy Antibiotic Note  Corey Palmer is a 49 y.o. male admitted on 01/13/2020  s/p CABG with Impella placement, remains with open chest with concerns for possible sepsis. Pharmacy has been consulted for vancomycin and meropenem dosing.  Chest re-opened 12/24. CRRT for volume removal. Vanc trough came back therapeutic at 20 prior to 4th dose. WBC trending down 14, Scr 1.6 on CRRT. Afebrile.  Plan: Continue vancomycin 1g IV every 24 hours  Continue meropenem 1 gm IV q8 hrs - stop date in for 01/29/2020 for meropenem Monitor renal function, cultures, clinical progression  Check vancomycin levels as indicated - continue while chest remains open  Height: 6\' 3"  (190.5 cm) Weight: 108.2 kg (238 lb 8.6 oz) IBW/kg (Calculated) : 84.5  Temp (24hrs), Avg:98.2 F (36.8 C), Min:97.8 F (36.6 C), Max:99 F (37.2 C)  Recent Labs  Lab 01/21/20 1551 01/22/20 0319 01/24/20 0245 01/24/20 1600 01/24/20 1700 01/11/2020 0415 01/05/2020 1200 01/13/2020 1548 01/26/20 0401 01/26/20 1554 01/27/20 0359 01/27/20 1201  WBC 33.8*   < > 22.0*  --  22.2* 21.3*  --  19.6* 18.1*  --  14.4*  --   CREATININE 1.78*   < > 1.88*   < >  --   --  1.70* 1.83* 1.73* 1.70* 1.60*  --   LATICACIDVEN 1.3  --   --   --   --   --   --   --   --   --   --   --   VANCOTROUGH  --    < > 21*  --   --   --   --   --   --   --   --  20   < > = values in this interval not displayed.    Estimated Creatinine Clearance: 72.6 mL/min (A) (by C-G formula based on SCr of 1.6 mg/dL (H)).    Allergies  Allergen Reactions  . Orange Fruit Anaphylaxis, Hives and Other (See Comments)    Per Pt- Blisters around lips and Hives all over, also  . Penicillins Anaphylaxis    Did it involve swelling of the face/tongue/throat, SOB, or low BP? Yes Did it involve sudden or severe rash/hives, skin peeling, or any reaction on the inside of your mouth or nose? Yes Did you need to seek medical attention at a hospital or doctor's office? No When did it  last happen?childhood If all above answers are "NO", may proceed with cephalosporin use.  [name] [Empagliflozin] Itching  . [name] [Insulin Glargine] Nausea And Vomiting    Antimicrobials this admission: Vancomycin 12/15 >>  Meropenem 12/16 >>   Dose adjustments this admission: 12/18: VT 26 - decrease to 750 mg IV q12 hrs 12/19: starting CRRT - adjust Vanc to 1500 mg IV q24 hrs 12/21 VT 27 - decrease Vanc to 1250 q24hr 12/25 VT 31 - hold vancomycin for 12/26 12/27 VT 21 (after holding doses) - restart vanc 1g IV q24hr 12/27 VT 20 - continue  Microbiology results: 12/16 BCx: ngtd 12/14 BCx: ngtd  12/11 MRSA PCR: neg  [name], PharmD, BCCCP Clinical Pharmacist  Phone: [phone] 01/27/2020 2:26 PM  Please check AMION for all [place] phone numbers After 10:00 PM, call [place] [phone]

## 2020-01-27 NOTE — Progress Notes (Signed)
Corey Palmer for heparin Indication: Impella 5.5  Allergies  Allergen Reactions   Orange Fruit Anaphylaxis, Hives and Other (See Comments)    Per Pt- Blisters around lips and Hives all over, also   Penicillins Anaphylaxis    Did it involve swelling of the face/tongue/throat, SOB, or low BP? Yes Did it involve sudden or severe rash/hives, skin peeling, or any reaction on the inside of your mouth or nose? Yes Did you need to seek medical attention at a hospital or doctor's office? No When did it last happen?childhood If all above answers are NO, may proceed with cephalosporin use.   Jardiance [Empagliflozin] Itching   Basaglar Kwikpen [Insulin Glargine] Nausea And Vomiting    Patient Measurements: Height: 6\' 3"  (190.5 cm) Weight: 108.2 kg (238 lb 8.6 oz) IBW/kg (Calculated) : 84.5 Heparin dosing wt: 101 kg  Vital Signs: Temp: 98.4 F (36.9 C) (12/30 1615) Temp Source: Core (12/30 1600) BP: 100/70 (12/30 1437) Pulse Rate: 97 (12/30 1615)  Labs: Recent Labs    01/23/2020 1548 01/04/2020 1555 01/26/20 0401 01/26/20 0446 01/26/20 1554 01/26/20 1556 01/27/20 0359 01/27/20 0451 01/27/20 1631  HGB 8.6*   < > 8.7*   < >  --  9.5* 8.3* 8.8*  --   HCT 28.1*   < > 27.1*   < >  --  28.0* 27.8* 26.0*  --   PLT 257  --  262  --   --   --  222  --   --   HEPARINUNFRC  --   --  <0.10*  --  <0.10*  --  <0.10*  --  <0.10*  CREATININE 1.83*  --  1.73*  --  1.70*  --  1.60*  --   --    < > = values in this interval not displayed.    Estimated Creatinine Clearance: 72.6 mL/min (A) (by C-G formula based on SCr of 1.6 mg/dL (H)).   Assessment: 49 yo male s/p impella 5.5 placement. Pt started on heparinized purge and systemic heparin. Pt now s/p CABG 12/15 with Impella remaining in place c/b significant bleeding and hematoma requiring opened chest and reexploration in OR 12/16. Chest closed on 12/23, re-opened on 12/24.  Impella on P8 with  stable purge pressures, purge flow rate 8.9 ml/hr (approx 445 units/hr heparin). Heparin level undetectable as expected, Hgb at 8.8, plt 222. LDH stable at 377. No s/sx of bleeding per nursing. Underwent cardioversion in OR 12/28, s/p sternal wash out/application of wound VAC. Chest remains open, will dose heparin conservatively.  Heparin level remains undetectable tonight. Purge infusion volume stable.  Goal of Therapy:  Heparin level 0.2-0.3 units/ml Monitor platelets by anticoagulation protocol: Yes   Plan:  Continue heparin via the purge   Increase systemic heparin infusion to 500 units/h Recheck heparin level in 8h   [name], PharmD, [place], [place] Pharmacist [phone] Please check AMION for all [facility] Pharmacy numbers 01/27/2020

## 2020-01-27 NOTE — Progress Notes (Signed)
[facility] BRIEF SICU PROGRESS NOTE  2 Days Post-Op  S/P Procedure(s) (LRB): STERNAL WASH OUT (N/A) APPLICATION OF WOUND VAC (N/A)   Reportedly stable day Off nitric oxide NSR w/ stable hemodynamics and Impella flows on P8 O2 sats 98-100% on 40% FiO2  Plan: Continue current plan  Rexene Alberts, MD 01/27/2020 5:52 PM

## 2020-01-27 NOTE — Progress Notes (Signed)
[place] for heparin Indication: Impella 5.5  Allergies  Allergen Reactions  . Orange Fruit Anaphylaxis, Hives and Other (See Comments)    Per Pt- Blisters around lips and Hives all over, also  . Penicillins Anaphylaxis    Did it involve swelling of the face/tongue/throat, SOB, or low BP? Yes Did it involve sudden or severe rash/hives, skin peeling, or any reaction on the inside of your mouth or nose? Yes Did you need to seek medical attention at a hospital or doctor's office? No When did it last happen?childhood If all above answers are "NO", may proceed with cephalosporin use.  [name] [Empagliflozin] Itching  . Basaglar Kwikpen [Insulin Glargine] Nausea And Vomiting    Patient Measurements: Height: 6\' 3"  (190.5 cm) Weight: 108.2 kg (238 lb 8.6 oz) IBW/kg (Calculated) : 84.5 Heparin dosing wt: 101 kg  Vital Signs: Temp: 98.1 F (36.7 C) (12/30 0700) Temp Source: Core (12/30 0400) BP: 85/70 (12/30 0045) Pulse Rate: 97 (12/30 0700)  Labs: Recent Labs    01/05/2020 1548 01/24/2020 1555 01/26/20 0401 01/26/20 0446 01/26/20 1554 01/26/20 1556 01/27/20 0359 01/27/20 0451  HGB 8.6*   < > 8.7*   < >  --  9.5* 8.3* 8.8*  HCT 28.1*   < > 27.1*   < >  --  28.0* 27.8* 26.0*  PLT 257  --  262  --   --   --  222  --   HEPARINUNFRC  --   --  <0.10*  --  <0.10*  --  <0.10*  --   CREATININE 1.83*  --  1.73*  --  1.70*  --  1.60*  --    < > = values in this interval not displayed.    Estimated Creatinine Clearance: 72.6 mL/min (A) (by C-G formula based on SCr of 1.6 mg/dL (H)).   Assessment: 49 yo male s/p impella 5.5 placement. Pt started on heparinized purge and systemic heparin. Pt now s/p CABG 12/15 with Impella remaining in place c/b significant bleeding and hematoma requiring opened chest and reexploration in OR 12/16. Chest closed on 12/23, re-opened on 12/24.  Impella on P8 with stable purge pressures, purge flow rate 8.9  ml/hr (approx 445 units/hr heparin). Heparin level undetectable as expected, Hgb at 8.8, plt 222. LDH stable at 377. No s/sx of bleeding per nursing. Underwent cardioversion in OR 12/28, s/p sternal wash out/application of wound VAC. Chest remains open, will dose heparin conservatively.  Heparin level came back undetectable at <0.1, on systemic heparin at 300 units/hr.   Goal of Therapy:  Heparin level 0.2-0.3 units/ml Monitor platelets by anticoagulation protocol: Yes   Plan:  Continue heparin via the purge   Increase systemic heparin infusion to 400 units/hr  Daily heparin level, LDH, CBC, and for s/sx of bleeding  [name], PharmD, [place] Pharmacist  Phone: [phone] 01/27/2020 7:51 AM  Please check AMION for all [place] phone numbers After 10:00 PM, call [place] [phone]

## 2020-01-28 ENCOUNTER — Inpatient Hospital Stay (HOSPITAL_COMMUNITY): Payer: BC Managed Care – PPO

## 2020-01-28 DIAGNOSIS — R570 Cardiogenic shock: Secondary | ICD-10-CM | POA: Diagnosis not present

## 2020-01-28 DIAGNOSIS — I4891 Unspecified atrial fibrillation: Secondary | ICD-10-CM | POA: Diagnosis not present

## 2020-01-28 DIAGNOSIS — I5023 Acute on chronic systolic (congestive) heart failure: Secondary | ICD-10-CM

## 2020-01-28 LAB — CBC WITH DIFFERENTIAL/PLATELET
Abs Immature Granulocytes: 0.38 10*3/uL — ABNORMAL HIGH (ref 0.00–0.07)
Basophils Absolute: 0.1 10*3/uL (ref 0.0–0.1)
Basophils Relative: 1 %
Eosinophils Absolute: 0.7 10*3/uL — ABNORMAL HIGH (ref 0.0–0.5)
Eosinophils Relative: 4 %
HCT: 30.2 % — ABNORMAL LOW (ref 39.0–52.0)
Hemoglobin: 8.9 g/dL — ABNORMAL LOW (ref 13.0–17.0)
Immature Granulocytes: 2 %
Lymphocytes Relative: 5 %
Lymphs Abs: 0.8 10*3/uL (ref 0.7–4.0)
MCH: 27.9 pg (ref 26.0–34.0)
MCHC: 29.5 g/dL — ABNORMAL LOW (ref 30.0–36.0)
MCV: 94.7 fL (ref 80.0–100.0)
Monocytes Absolute: 1.8 10*3/uL — ABNORMAL HIGH (ref 0.1–1.0)
Monocytes Relative: 11 %
Neutro Abs: 13.1 10*3/uL — ABNORMAL HIGH (ref 1.7–7.7)
Neutrophils Relative %: 77 %
Platelets: 282 10*3/uL (ref 150–400)
RBC: 3.19 MIL/uL — ABNORMAL LOW (ref 4.22–5.81)
RDW: 20.6 % — ABNORMAL HIGH (ref 11.5–15.5)
WBC: 16.8 10*3/uL — ABNORMAL HIGH (ref 4.0–10.5)
nRBC: 0.2 % (ref 0.0–0.2)

## 2020-01-28 LAB — POCT I-STAT 7, (LYTES, BLD GAS, ICA,H+H)
Acid-Base Excess: 0 mmol/L (ref 0.0–2.0)
Bicarbonate: 25.7 mmol/L (ref 20.0–28.0)
Calcium, Ion: 1.16 mmol/L (ref 1.15–1.40)
HCT: 28 % — ABNORMAL LOW (ref 39.0–52.0)
Hemoglobin: 9.5 g/dL — ABNORMAL LOW (ref 13.0–17.0)
O2 Saturation: 98 %
Patient temperature: 97.9
Potassium: 4.5 mmol/L (ref 3.5–5.1)
Sodium: 135 mmol/L (ref 135–145)
TCO2: 27 mmol/L (ref 22–32)
pCO2 arterial: 45.3 mmHg (ref 32.0–48.0)
pH, Arterial: 7.36 (ref 7.350–7.450)
pO2, Arterial: 111 mmHg — ABNORMAL HIGH (ref 83.0–108.0)

## 2020-01-28 LAB — GLUCOSE, CAPILLARY
Glucose-Capillary: 214 mg/dL — ABNORMAL HIGH (ref 70–99)
Glucose-Capillary: 224 mg/dL — ABNORMAL HIGH (ref 70–99)
Glucose-Capillary: 237 mg/dL — ABNORMAL HIGH (ref 70–99)
Glucose-Capillary: 243 mg/dL — ABNORMAL HIGH (ref 70–99)
Glucose-Capillary: 253 mg/dL — ABNORMAL HIGH (ref 70–99)
Glucose-Capillary: 256 mg/dL — ABNORMAL HIGH (ref 70–99)
Glucose-Capillary: 265 mg/dL — ABNORMAL HIGH (ref 70–99)

## 2020-01-28 LAB — RENAL FUNCTION PANEL
Albumin: 1.8 g/dL — ABNORMAL LOW (ref 3.5–5.0)
Albumin: 1.8 g/dL — ABNORMAL LOW (ref 3.5–5.0)
Anion gap: 9 (ref 5–15)
Anion gap: 8 (ref 5–15)
BUN: 30 mg/dL — ABNORMAL HIGH (ref 6–20)
BUN: 35 mg/dL — ABNORMAL HIGH (ref 6–20)
CO2: 25 mmol/L (ref 22–32)
CO2: 23 mmol/L (ref 22–32)
Calcium: 8 mg/dL — ABNORMAL LOW (ref 8.9–10.3)
Calcium: 7.7 mg/dL — ABNORMAL LOW (ref 8.9–10.3)
Chloride: 100 mmol/L (ref 98–111)
Chloride: 101 mmol/L (ref 98–111)
Creatinine, Ser: 1.7 mg/dL — ABNORMAL HIGH (ref 0.61–1.24)
Creatinine, Ser: 1.61 mg/dL — ABNORMAL HIGH (ref 0.61–1.24)
GFR, Estimated: 48 mL/min — ABNORMAL LOW (ref >60)
GFR, Estimated: 51 mL/min — ABNORMAL LOW (ref >60)
Glucose, Bld: 222 mg/dL — ABNORMAL HIGH (ref 70–99)
Glucose, Bld: 263 mg/dL — ABNORMAL HIGH (ref 70–99)
Phosphorus: 3.1 mg/dL (ref 2.5–4.6)
Phosphorus: 2.6 mg/dL (ref 2.5–4.6)
Potassium: 4.8 mmol/L (ref 3.5–5.1)
Potassium: 4.3 mmol/L (ref 3.5–5.1)
Sodium: 134 mmol/L — ABNORMAL LOW (ref 135–145)
Sodium: 132 mmol/L — ABNORMAL LOW (ref 135–145)

## 2020-01-28 LAB — COOXEMETRY PANEL
Carboxyhemoglobin: 1.4 % (ref 0.5–1.5)
Carboxyhemoglobin: 1.7 % — ABNORMAL HIGH (ref 0.5–1.5)
Methemoglobin: 0.8 % (ref 0.0–1.5)
Methemoglobin: 0.9 % (ref 0.0–1.5)
O2 Saturation: 35.9 %
O2 Saturation: 51.6 %
Total hemoglobin: 9.2 g/dL — ABNORMAL LOW (ref 12.0–16.0)
Total hemoglobin: 9 g/dL — ABNORMAL LOW (ref 12.0–16.0)

## 2020-01-28 LAB — HEPARIN LEVEL (UNFRACTIONATED)
Heparin Unfractionated: <0.1 IU/mL — ABNORMAL LOW (ref 0.30–0.70)
Heparin Unfractionated: <0.1 IU/mL — ABNORMAL LOW (ref 0.30–0.70)

## 2020-01-28 LAB — PREALBUMIN: Prealbumin: 7.3 mg/dL — ABNORMAL LOW (ref 18–38)

## 2020-01-28 LAB — LACTATE DEHYDROGENASE: LDH: 437 U/L — ABNORMAL HIGH (ref 98–192)

## 2020-01-28 LAB — ECHOCARDIOGRAM LIMITED
Height: 75 in
Weight: 3770.75 oz

## 2020-01-28 LAB — MAGNESIUM: Magnesium: 2.3 mg/dL (ref 1.7–2.4)

## 2020-01-28 NOTE — Progress Notes (Signed)
3 Days Post-Op Procedure(s) (LRB): STERNAL WASH OUT (N/A) APPLICATION OF WOUND VAC (N/A) Subjective: sedated  Objective: Vital signs in last 24 hours: Temp:  [97.6 F (36.4 C)-100.1 F (37.8 C)] 98 F (36.7 C) (12/31 0755) Pulse Rate:  [81-103] 88 (12/31 0755) Cardiac Rhythm: Normal sinus rhythm;Heart block (12/30 2000) Resp:  [14-32] 28 (12/31 0755) BP: (93-100)/(60-70) 93/60 (12/31 0755) SpO2:  [97 %-100 %] 100 % (12/31 0755) Arterial Line BP: (77-138)/(53-88) 92/65 (12/31 0700) FiO2 (%):  [40 %] 40 % (12/31 0755) Weight:  [106.9 kg] 106.9 kg (12/31 0630)  Hemodynamic parameters for last 24 hours: PAP: (24-44)/(13-28) 33/22 CVP:  [7 mmHg-18 mmHg] 10 mmHg PCWP:  [22 mmHg] 22 mmHg CO:  [5.3 L/min-5.8 L/min] 5.8 L/min CI:  [2.3 L/min/m2-2.5 L/min/m2] 2.5 L/min/m2  Intake/Output from previous day: 12/30 0701 - 12/31 0700 In: 5751 [I.V.:2597.1; NG/GT:1713.3; IV Piggyback:1006.8] Out: 4888 [Drains:55; Stool:720] Intake/Output this shift: No intake/output data recorded.  General appearance: no distress Neurologic: unable to fully asses Heart: regular rate and rhythm, S1, S2 normal, no murmur, click, rub or gallop Lungs: clear to auscultation bilaterally Abdomen: soft, non-tender; bowel sounds normal; no masses,  no organomegaly Extremities: edema mild Wound: dressing intact; wound vac dressing changed at the bedside today  Lab Results: Recent Labs    01/27/20 0359 01/27/20 0451 01/28/20 0258 01/28/20 0447  WBC 14.4*  --  16.8*  --   HGB 8.3*   < > 8.9* 9.5*  HCT 27.8*   < > 30.2* 28.0*  PLT 222  --  282  --    < > = values in this interval not displayed.   BMET:  Recent Labs    01/27/20 1631 01/28/20 0258 01/28/20 0447  NA 133* 134* 135  K 4.2 4.8 4.5  CL 101 100  --   CO2 21* 25  --   GLUCOSE 268* 222*  --   BUN 26* 30*  --   CREATININE 1.63* 1.70*  --   CALCIUM 7.9* 8.0*  --     PT/INR: No results for input(s): LABPROT, INR in the last 72  hours. ABG    Component Value Date/Time   PHART 7.360 01/28/2020 0447   HCO3 25.7 01/28/2020 0447   TCO2 27 01/28/2020 0447   ACIDBASEDEF 2.0 01/06/2020 1555   O2SAT 98.0 01/28/2020 0447   CBG (last 3)  Recent Labs    01/27/20 1950 01/28/20 0022 01/28/20 0354  GLUCAP 234* 256* 214*    Assessment/Plan: S/P Procedure(s) (LRB): STERNAL WASH OUT (N/A) APPLICATION OF WOUND VAC (N/A) Mobilize Diuresis change lines from right groin to right neck; remove swan.   LOS: 29 days    [name] 01/28/2020

## 2020-01-28 NOTE — Progress Notes (Signed)
[facility] NEPHROLOGY PROGRESS NOTE  Assessment/ Plan:  #AKI/CKD stage III- presumably due to ischemic ATN in setting of cardiogenic shock s/p CABG.  CRRT started 01/16/20. All fluids 4K/2.5Ca: Pre-filter 500 ml/hr, post filter 200 ml/hr, dialysate 1500 ml/hr. No heparin due to hematoma. Right femoral HD catheter placed 01/16/20. Team planning to move to IJ -Cont CV, tolerating well; adjusting UF as needed -Moving access to IJ; agree Continue current CRRT prescription.  # CAD s/p CABG complicated by cardiogenic shock and post-op hematoma. secondary closure on 12/23 then had to be re opened.  Went to the OR for washout on 12/28.  # Cardiogenic shock- underlying familial cardiomyopathy s/p redo CABG. Remains on pressors: Levophed, epinephrine, and vasopressin. Impella and milrinone.  No significant change in requirement at this point  # ABLA- s/p postoperative bleeding into chest cavity s/p re-exploration for tamponade/hematoma. CT surgery following.stable, on aranesp 45mg weekly started 12/25. Hgb 9.5 today ctm  #Hypophosphatemia- due to CRRT. replete prn  # Atrial fibrillation - s/p DCCV on 12/16.  On amiodarone.  # VDRF- per primary svc.  #-Leukocytosis.stopped antibiotics, CTM   Subjective: Intubated, sedated, chest open at bed side.  Objective Vital signs in last 24 hours: Vitals:   01/28/20 0630 01/28/20 0645 01/28/20 0700 01/28/20 0755  BP:    93/60  Pulse: 95 91 88 88  Resp: (!) 28 (!) 28 (!) 28 (!) 28  Temp: 97.9 F (36.6 C) 97.8 F (36.6 C) 97.8 F (36.6 C) 98 F (36.7 C)  TempSrc:      SpO2: 100% 100% 100% 100%  Weight: 106.9 kg     Height:       Weight change: -1.3 kg  Intake/Output Summary (Last 24 hours) at 01/28/2020 0934 Last data filed at 01/28/2020 0700 Gross per 24 hour  Intake 5435.13 ml  Output 6429 ml  Net -993.87 ml       Labs: Basic Metabolic Panel: Recent Labs  Lab 01/27/20 0359 01/27/20 0451  01/27/20 1631 01/28/20 0258 01/28/20 0447  NA 133*   < > 133* 134* 135  K 3.7   < > 4.2 4.8 4.5  CL 102  --  101 100  --   CO2 24  --  21* 25  --   GLUCOSE 169*  --  268* 222*  --   BUN 21*  --  26* 30*  --   CREATININE 1.60*  --  1.63* 1.70*  --   CALCIUM 7.6*  --  7.9* 8.0*  --   PHOS 2.3*  --  2.9 3.1  --    < > = values in this interval not displayed.   Liver Function Tests: Recent Labs  Lab 01/22/20 0319 01/22/20 1546 01/27/20 0359 01/27/20 1631 01/28/20 0258  AST 36  --   --   --   --   ALT 45*  --   --   --   --   ALKPHOS 122  --   --   --   --   BILITOT 1.1  --   --   --   --   PROT 5.5*  --   --   --   --   ALBUMIN 2.0*   < > 1.8* 1.9* 1.8*   < > = values in this interval not displayed.   No results for input(s): LIPASE, AMYLASE in the last 168 hours. No results for input(s): AMMONIA in the last 168 hours. CBC: Recent Labs  Lab 01/24/2020 0415  01/04/2020 0416 01/04/2020 1548 01/28/2020 1555 01/26/20 0401 01/26/20 0446 01/27/20 0359 01/27/20 0451 01/28/20 0258 01/28/20 0447  WBC 21.3*  --  19.6*  --  18.1*  --  14.4*  --  16.8*  --   NEUTROABS 18.7*  --   --   --  15.5*  --  11.9*  --  13.1*  --   HGB 9.0*   < > 8.6*   < > 8.7*   < > 8.3* 8.8* 8.9* 9.5*  HCT 27.9*   < > 28.1*   < > 27.1*   < > 27.8* 26.0* 30.2* 28.0*  MCV 90.9  --  92.7  --  90.6  --  92.4  --  94.7  --   PLT 256  --  257  --  262  --  222  --  282  --    < > = values in this interval not displayed.   Cardiac Enzymes: No results for input(s): CKTOTAL, CKMB, CKMBINDEX, TROPONINI in the last 168 hours. CBG: Recent Labs  Lab 01/27/20 1231 01/27/20 1606 01/27/20 1950 01/28/20 0022 01/28/20 0354  GLUCAP 180* 271* 234* 256* 214*    Iron Studies: No results for input(s): IRON, TIBC, TRANSFERRIN, FERRITIN in the last 72 hours. Studies/Results: DG Chest Port 1 View  Result Date: 01/28/2020 CLINICAL DATA:  Open heart surgery EXAM: PORTABLE CHEST 1 VIEW COMPARISON:  01/27/2020 FINDINGS:  Support devices remain in stable position. Bilateral chest tubes in place. No pneumothorax. Cardiomegaly with vascular congestion. Low volumes with bilateral airspace opacities, worsening since prior study, likely worsening edema and/or atelectasis. No acute bony abnormality. IMPRESSION: Stable support devices. Worsening bilateral airspace disease, likely a combination of edema and bibasilar atelectasis. Low lung volumes. Electronically Signed   By: Rolm Baptise M.D.   On: 01/28/2020 07:32   DG Chest Port 1 View  Result Date: 01/27/2020 CLINICAL DATA:  Hypoxia EXAM: PORTABLE CHEST 1 VIEW COMPARISON:  January 26, 2020 FINDINGS: Endotracheal tube tip is 4.6 cm above the carina. Swan-Ganz catheter tip is in the right main pulmonary artery. Impella device unchanged in position. Bilateral chest tubes present. Pacemaker lead attached to the right ventricle. No pneumothorax. There is mild bibasilar atelectasis. No edema or airspace opacity. Stable cardiomegaly with pulmonary vascularity within normal limits. No adenopathy. No bone lesions. IMPRESSION: Tube and catheter positions as described without pneumothorax. Stable cardiac prominence. Bibasilar atelectasis. No edema or consolidation. Electronically Signed   By: Lowella Grip III M.D.   On: 01/27/2020 07:55    Medications: Infusions: .  prismasol BGK 4/2.5 500 mL/hr at 01/28/20 0543  .  prismasol BGK 4/2.5 200 mL/hr at 01/28/20 0059  . sodium chloride Stopped (01/14/2020 1646)  . sodium chloride 10 mL/hr at 01/15/2020 1302  . sodium chloride Stopped (01/16/20 1032)  . sodium chloride Stopped (01/27/20 1510)  . amiodarone 30 mg/hr (01/28/20 0700)  . epinephrine 3 mcg/min (01/28/20 0700)  . feeding supplement (PIVOT 1.5 CAL) 1,000 mL (01/28/20 0012)  . fentaNYL infusion INTRAVENOUS 150 mcg/hr (01/28/20 0700)  . heparin 600 Units/hr (01/28/20 0700)  . impella catheter heparin 50 unit/mL in dextrose 5% 9 mL/hr at 01/21/20 0015  . lactated ringers 20  mL/hr at 01/28/20 0700  . midazolam 3 mg/hr (01/28/20 0700)  . milrinone 0.125 mcg/kg/min (01/28/20 0700)  . norepinephrine (LEVOPHED) Adult infusion 20 mcg/min (01/28/20 0700)  . prismasol BGK 4/2.5 1,500 mL/hr at 01/28/20 7356  . vasopressin 0.04 Units/min (01/28/20 0700)    Scheduled Medications: .  sodium chloride   Intravenous Once  . vitamin C  500 mg Per Tube TID  . aspirin  324 mg Per Tube Daily  . atorvastatin  80 mg Per Tube Daily  . busPIRone  10 mg Per Tube TID  . chlorhexidine gluconate (MEDLINE KIT)  15 mL Mouth Rinse BID  . Chlorhexidine Gluconate Cloth  6 each Topical Daily  . darbepoetin (ARANESP) injection - NON-DIALYSIS  60 mcg Subcutaneous Q Sat-1800  . feeding supplement (PROSource TF)  45 mL Per Tube TID  . fiber  1 packet Per Tube BID  . FLUoxetine  40 mg Per Tube BID  . insulin aspart  0-15 Units Subcutaneous Q4H  . insulin detemir  10 Units Subcutaneous BID  . mouth rinse  15 mL Mouth Rinse 10 times per day  . melatonin  5 mg Per Tube Daily  . midodrine  10 mg Per Tube TID WC  . multivitamin  1 tablet Per Tube QHS  . oxyCODONE  5 mg Per Tube Q8H  . pantoprazole sodium  40 mg Per Tube Daily  . QUEtiapine  25 mg Per Tube QHS  . sodium chloride flush  10-40 mL Intracatheter Q12H  . sodium chloride flush  3 mL Intravenous Q12H  . thiamine  100 mg Per Tube Daily  . valproic acid  250 mg Per Tube BID    have reviewed scheduled and prn medications.  Physical Exam:  General: Critically ill male, intubated, sedated, multiple IV lines and pressors. Heart:normal rate, no audible murmur Lungs open external wound.  Coarse breath sound. Abdomen:soft, non-distended Extremities: Bilateral lower extremity edema present, unchanged Dialysis Access: Right femoral HD catheter.  Shaune Pollack  01/28/2020,9:34 AM  LOS: 29 days  Pager: [phone]

## 2020-01-28 NOTE — Progress Notes (Addendum)
Patient ID: Corey Palmer, male   DOB: 15-Oct-1970, 49 y.o.   MRN: [id number]     Advanced Heart Failure Rounding Note  PCP-Cardiologist: No primary care provider on file.   Subjective:    - 12/7 Torsades -ICD shock x1.  - 12/11 Impella 5.5 placed.  - 12/12 Impella repositioned in OR - 12/15 CABG x 4 with LIMA-LAD, SVG-PDA/PLV, SVG-OM - 12/16 DCCV afib.  Back to OR to re-open chest and again to evacuate hematoma.  - 12/19 CVVH initiated  - 12/23 Chest closed - 12/24 Chest reopened - 12/26 RAP back into NSR - 12/27 back in atrial fibrillation, unable to rapid atrial pace out.   - 12/28 To OR, chest partially closed and wound vac placed.  DCCV to NSR.   On NE 20  Epi 3, VP 0.04, milrinone 0.125 and Impella at P8 . Off nitric oxygen.    Remains on vancomycin/meropenem with open chest.   PAP: (24-44)/(13-28) 33/22 CVP:  [7 mmHg-18 mmHg] 10 mmHg PCWP:  [22 mmHg] 22 mmHg CO:  [5.3 L/min-5.8 L/min] 5.8 L/min CI:  [2.3 L/min/m2-2.5 L/min/m2] 2.5 L/min/m2  Impella 5.5 P-8  Flow 4.4 Heparin only in purge Waveforms ok. No alarms LDH 357 => 349 => 333=>377=>437  Echo (12/10): EF <20%, moderate LV dilation, normal RV size with mildly decreased systolic function.    Objective:   Weight Range: 106.9 kg Body mass index is 29.46 kg/m.   Vital Signs:   Temp:  [97.6 F (36.4 C)-100.1 F (37.8 C)] 97.8 F (36.6 C) (12/31 0700) Pulse Rate:  [81-103] 88 (12/31 0700) Resp:  [12-32] 28 (12/31 0700) BP: (82-100)/(56-70) 95/66 (12/30 1555) SpO2:  [97 %-100 %] 100 % (12/31 0700) Arterial Line BP: (77-138)/(53-88) 92/65 (12/31 0700) FiO2 (%):  [40 %] 40 % (12/31 0341) Weight:  [106.9 kg] 106.9 kg (12/31 0630) Last BM Date: 01/27/20  Weight change: Filed Weights   01/26/20 0500 01/27/20 0500 01/28/20 0630  Weight: 109 kg 108.2 kg 106.9 kg    Intake/Output:   Intake/Output Summary (Last 24 hours) at 01/28/2020 0749 Last data filed at 01/28/2020 0700 Gross per 24 hour  Intake  5751.04 ml  Output 6903 ml  Net -1151.96 ml      Physical Exam  General:Intubated  HEENT: ETT  Neck: supple. no JVD. Carotids 2+ bilat; no bruits. No lymphadenopathy or thryomegaly appreciated. Cor: PMI nondisplaced. Regular rate & rhythm. No rubs, gallops or murmurs. Open chest with VAC.  Lungs: clear Abdomen: soft, nontender, nondistended. No hepatosplenomegaly. No bruits or masses. Good bowel sounds. Extremities: no cyanosis, clubbing, rash, edema Neuro: Intubated/sedated  Telemetry  NSR 80s    Labs    CBC Recent Labs    01/27/20 0359 01/27/20 0451 01/28/20 0258 01/28/20 0447  WBC 14.4*  --  16.8*  --   NEUTROABS 11.9*  --  13.1*  --   HGB 8.3*   < > 8.9* 9.5*  HCT 27.8*   < > 30.2* 28.0*  MCV 92.4  --  94.7  --   PLT 222  --  282  --    < > = values in this interval not displayed.   Basic Metabolic Panel Recent Labs    01/27/20 0359 01/27/20 0451 01/27/20 1631 01/28/20 0258 01/28/20 0447  NA 133*   < > 133* 134* 135  K 3.7   < > 4.2 4.8 4.5  CL 102  --  101 100  --   CO2 24  --  21*  25  --   GLUCOSE 169*  --  268* 222*  --   BUN 21*  --  26* 30*  --   CREATININE 1.60*  --  1.63* 1.70*  --   CALCIUM 7.6*  --  7.9* 8.0*  --   MG 2.4  --   --  2.3  --   PHOS 2.3*  --  2.9 3.1  --    < > = values in this interval not displayed.   Liver Function Tests Recent Labs    01/27/20 1631 01/28/20 0258  ALBUMIN 1.9* 1.8*   No results for input(s): LIPASE, AMYLASE in the last 72 hours. Cardiac Enzymes No results for input(s): CKTOTAL, CKMB, CKMBINDEX, TROPONINI in the last 72 hours.  BNP: BNP (last 3 results) Recent Labs    01/02/2020 1619  BNP 577.5*    ProBNP (last 3 results) No results for input(s): PROBNP in the last 8760 hours.   D-Dimer No results for input(s): DDIMER in the last 72 hours. Hemoglobin A1C No results for input(s): HGBA1C in the last 72 hours. Fasting Lipid Panel No results for input(s): CHOL, HDL, LDLCALC, TRIG, CHOLHDL,  LDLDIRECT in the last 72 hours. Thyroid Function Tests No results for input(s): TSH, T4TOTAL, T3FREE, THYROIDAB in the last 72 hours.  Invalid input(s): FREET3  Other results:   Imaging    DG Chest Port 1 View  Result Date: 01/28/2020 CLINICAL DATA:  Open heart surgery EXAM: PORTABLE CHEST 1 VIEW COMPARISON:  01/27/2020 FINDINGS: Support devices remain in stable position. Bilateral chest tubes in place. No pneumothorax. Cardiomegaly with vascular congestion. Low volumes with bilateral airspace opacities, worsening since prior study, likely worsening edema and/or atelectasis. No acute bony abnormality. IMPRESSION: Stable support devices. Worsening bilateral airspace disease, likely a combination of edema and bibasilar atelectasis. Low lung volumes. Electronically Signed   By: Rolm Baptise M.D.   On: 01/28/2020 07:32     Medications:     Scheduled Medications: . sodium chloride   Intravenous Once  . vitamin C  500 mg Per Tube TID  . aspirin  324 mg Per Tube Daily  . atorvastatin  80 mg Per Tube Daily  . busPIRone  10 mg Per Tube TID  . chlorhexidine gluconate (MEDLINE KIT)  15 mL Mouth Rinse BID  . Chlorhexidine Gluconate Cloth  6 each Topical Daily  . darbepoetin (ARANESP) injection - NON-DIALYSIS  60 mcg Subcutaneous Q Sat-1800  . feeding supplement (PROSource TF)  45 mL Per Tube TID  . fiber  1 packet Per Tube BID  . FLUoxetine  40 mg Per Tube BID  . insulin aspart  0-15 Units Subcutaneous Q4H  . insulin detemir  10 Units Subcutaneous BID  . mouth rinse  15 mL Mouth Rinse 10 times per day  . melatonin  5 mg Per Tube Daily  . midodrine  10 mg Per Tube TID WC  . multivitamin  1 tablet Per Tube QHS  . oxyCODONE  5 mg Per Tube Q8H  . pantoprazole sodium  40 mg Per Tube Daily  . QUEtiapine  25 mg Per Tube QHS  . sodium chloride flush  10-40 mL Intracatheter Q12H  . sodium chloride flush  3 mL Intravenous Q12H  . thiamine  100 mg Per Tube Daily  . valproic acid  250 mg Per  Tube BID    Infusions: .  prismasol BGK 4/2.5 500 mL/hr at 01/28/20 0543  .  prismasol BGK 4/2.5 200 mL/hr at 01/28/20 0059  .  sodium chloride Stopped (01/02/2020 1646)  . sodium chloride 10 mL/hr at 01/18/2020 1302  . sodium chloride Stopped (01/16/20 1032)  . sodium chloride Stopped (01/27/20 1510)  . amiodarone 30 mg/hr (01/28/20 0700)  . epinephrine 3 mcg/min (01/28/20 0700)  . feeding supplement (PIVOT 1.5 CAL) 1,000 mL (01/28/20 0012)  . fentaNYL infusion INTRAVENOUS 150 mcg/hr (01/28/20 0700)  . heparin 600 Units/hr (01/28/20 0700)  . impella catheter heparin 50 unit/mL in dextrose 5% 9 mL/hr at 01/21/20 0015  . lactated ringers 20 mL/hr at 01/28/20 0700  . meropenem (MERREM) IV Stopped (01/28/20 0151)  . midazolam 3 mg/hr (01/28/20 0700)  . milrinone 0.125 mcg/kg/min (01/28/20 0700)  . norepinephrine (LEVOPHED) Adult infusion 20 mcg/min (01/28/20 0700)  . prismasol BGK 4/2.5 1,500 mL/hr at 01/28/20 2423  . vancomycin    . vasopressin 0.04 Units/min (01/28/20 0700)    PRN Medications: sodium chloride, sodium chloride, sodium chloride, artificial tears, bisacodyl **OR** bisacodyl, dextrose, docusate, fentaNYL, fentaNYL (SUBLIMAZE) injection, fentaNYL (SUBLIMAZE) injection, heparin, heparin, metoprolol tartrate, midazolam, midazolam, midazolam, ondansetron (ZOFRAN) IV, polyethylene glycol, sodium chloride flush, sodium chloride flush     Assessment/Plan   1. Cardiogenic shock/acute on chronic systolic CHF: Initially nonischemic cardiomyopathy. Possible familial cardiomyopathy as both parents had cardiomyopathy and died at around 105.However, [employer] gene testing did not show any common mutation for cardiomyopathy.  However, this admission noted to have severe 3 vessel disease so suspect component of ischemic cardiomyopathy. [employer] Jude ICD. Echo in 8/20 with EF 15% and mildly decreased RV function. Echo this admission with EF < 20%, moderate LV dilation, RV mildly reduced, severe  LAE, no significant MR.Low output HF with markedly low EF.  He has a long history of cardiomyopathy (20 yrs), tends to minimize symptoms.  Cardiorenal syndrome with creatinine up to 2.2,  stabilized on milrinone and Impella 5.5. SCr 1.44 day of CABG. CABG 12/15.  Post-op shock, back to OR 12/16 to open chest (chest wall compartment syndrome) and again to evacuate hematoma.  TEE post-op with severe RV dysfunction.  CVVHD initiated 12/19 for volume removal in setting of rising Scr/decreased UOP. Suspect ATN. S/p chest closure 12/23. Hemodynamics much worse on 12/24 with rising pressor demands. Co-ox 36%.  Bedside echo severe biventricular dysfunction with marked septal bounce. Chest re-opened 12/24.  Was atrial paced back to NSR on 12/26 with improvement in hemodynamics but back in atrial fibrillation on 12/27 and unable to pace out, DCCV to NSR on 12/28. Remains on pressors (NE 20, Epi 3, VP 0.04, milrinone 0.125). CO-OX 52%.  - Aim for CVVH net negative 50 hr today.    - Continue  Impella to P8 to facilitate weaning of vasoactive meds.  LDH trending up .   2. Atrial fibrillation: H/o PAF.  He was on dofetilide in the remote past but this was stopped due to noncompliance. He had an upper GI bleed from antral ulcers in 3/12. He was seen by GI and was deemed safe to restart anticoagulation as long as he remains on a PPI.  No apparent recurrence of AF until just prior to this admission, was cardioverted back to NSR in ER but went back into atrial fibrillation.  Post-op afib, DCCV to NSR/BiV pacing am 12/16 -> back to AF. Rapid atrial pacing back to SR on 12/26 -> back in AF on 12/27 and unable to pace out. 12/28 DCCV to NSR, in NSR today.  - Continue IV amiodarone 30 mg/hr.   - He is now back on heparin gtt.  -  Did not have Maze with CABG.    3. CAD:   Cath this admission with severe 3 vessel disease. CABG x 4 on 12/15.  - Continue atorvastatin, ASA.  - continue current management 4. Acute on chronic  hypoxemic respiratory failure: Vent per CCM.  - CCM following - Will need tracheostomy when chest fully closed.  5. AKI on CKD stage 3: Likely ATN/cardiorenal.  On CVVH.  CVP down to 8  - Aim for net negative 50cc/hr today.  6. Diabetes: Insulin.  7. Hyponatremia: Resolved.  8. Torsades/ICD shock: 12/7/212 in hospital event, in setting of severe hypokalemia and hypomagnesemia.   9. Gout: h/o gout. Complained of rt knee pain c/w previous flares - treated w/ prednisone burst (completed).  10: ID: Treating for PNA and also with chest wall open.   Coming off antibiotics today per CCM/CT surgery.  11. FEN: TFs ongoing.  Prealbumin 7.3   Removing swan today for line holiday. VAC changed at bed side today by Dr [name].    Amy Clegg NP-C  01/28/2020 7:49 AM  Patient seen with NP, agree with the above note.   Impella stable at P8, no alarms.  LDH mildly higher at 437.  He is in NSR still.  Now on epinephrine 3, NE 24, vasopressin 0.04, milrinone 0.125.  Pulling UF net negative 50 cc/hr, weight down 3 lbs.  CVP 8-9 today. Ci 2.4, co-ox 52%. Off NO.  Wound vac changed today.   He will wake up and follow commands with sedation wean.   General: Intubated/sedated Neck: JVP 9-10 cm, no thyromegaly or thyroid nodule.  Lungs: Decreased at bases.  CV: Nondisplaced PMI.  Heart regular S1/S2, no S3/S4, no murmur.  No peripheral edema.   Abdomen: Soft, nontender, no hepatosplenomegaly, no distention.  Skin: Intact without lesions or rashes.  Neurologic: Will awaken with sedation wean. Extremities: No clubbing or cyanosis.  HEENT: Normal.    Continue slow wean of pressors today => will see if we can get vasopressin off today.  Impella at P8, stable without alarms. LDH mildly higher, will get echo for Impella position today => position looks stable, with septum bowing slightly to right, I increased speed to P9.   Volume status improving, Continue UF via CVVH aiming for net negative 50 cc/hr.     Discussed with TCTS, will remove Swan and femoral HD catheter, move HD catheter to right IJ.  Will follow CVP and co-ox off PICC, thermodilution CI has been stable (co-ox >50% seems to correlate with adequate CI). Antibiotics to be stopped today.   He remains in NSR on amiodarone gtt, continue. He is now on heparin gtt.   CRITICAL CARE Performed by: Loralie Champagne  Total critical care time: 40 minutes  Critical care time was exclusive of separately billable procedures and treating other patients.  Critical care was necessary to treat or prevent imminent or life-threatening deterioration.  Critical care was time spent personally by me on the following activities: development of treatment plan with patient and/or surrogate as well as nursing, discussions with consultants, evaluation of patient's response to treatment, examination of patient, obtaining history from patient or surrogate, ordering and performing treatments and interventions, ordering and review of laboratory studies, ordering and review of radiographic studies, pulse oximetry and re-evaluation of patient's condition.  Loralie Champagne 01/28/2020 9:03 AM

## 2020-01-28 NOTE — Progress Notes (Signed)
Results for TOMI, PADDOCK (MRN [id number]) as of 01/28/2020 14:10  Ref. Range 01/27/2020 16:06 01/27/2020 19:50 01/28/2020 00:22 01/28/2020 03:54 01/28/2020 07:39  Glucose-Capillary Latest Ref Range: 70 - 99 mg/dL 271 (H) 234 (H) 256 (H) 214 (H) 237 (H)  Noted that blood sugars continue to be greater than 180 mg/dl.  Recommend adding Novolog 3 units every 4 hours tube feed coverage if blood sugars continue to be elevated.  Harvel Ricks RN BSN CDE Diabetes Coordinator Pager: [phone]  8am-5pm

## 2020-01-28 NOTE — Progress Notes (Signed)
[place] for Heparin Indication: Impella 5.5  Allergies  Allergen Reactions  . Orange Fruit Anaphylaxis, Hives and Other (See Comments)    Per Pt- Blisters around lips and Hives all over, also  . Penicillins Anaphylaxis    Did it involve swelling of the face/tongue/throat, SOB, or low BP? Yes Did it involve sudden or severe rash/hives, skin peeling, or any reaction on the inside of your mouth or nose? Yes Did you need to seek medical attention at a hospital or doctor's office? No When did it last happen?childhood If all above answers are "NO", may proceed with cephalosporin use.  [name] [Empagliflozin] Itching  . Basaglar Kwikpen [Insulin Glargine] Nausea And Vomiting    Patient Measurements: Height: 6\' 3"  (190.5 cm) Weight: 108.2 kg (238 lb 8.6 oz) IBW/kg (Calculated) : 84.5 Heparin dosing wt: 101 kg  Vital Signs: Temp: 97.9 F (36.6 C) (12/31 0400) Temp Source: Core (12/30 2000) Pulse Rate: 95 (12/31 0400)  Labs: Recent Labs    01/26/20 0401 01/26/20 0446 01/26/20 1554 01/26/20 1556 01/27/20 0359 01/27/20 0451 01/27/20 1631 01/28/20 0258 01/28/20 0447  HGB 8.7*   < >  --    < > 8.3* 8.8*  --  8.9* 9.5*  HCT 27.1*   < >  --    < > 27.8* 26.0*  --  30.2* 28.0*  PLT 262  --   --   --  222  --   --  282  --   HEPARINUNFRC <0.10*  --  <0.10*  --  <0.10*  --  <0.10* <0.10*  --   CREATININE 1.73*  --  1.70*  --  1.60*  --  1.63*  --   --    < > = values in this interval not displayed.    Estimated Creatinine Clearance: 71.3 mL/min (A) (by C-G formula based on SCr of 1.63 mg/dL (H)).   Assessment: 49 yo male s/p impella 5.5 placement. Pt started on heparinized purge and systemic heparin. Pt now s/p CABG 12/15 with Impella remaining in place c/b significant bleeding and hematoma requiring opened chest and reexploration in OR 12/16. Chest closed on 12/23, re-opened on 12/24.  Impella on P8 with stable purge pressures,  purge flow rate 8.9 ml/hr (approx 445 units/hr heparin). Heparin level undetectable as expected, Hgb at 8.8, plt 222. LDH stable at 377. No s/sx of bleeding per nursing. Underwent cardioversion in OR 12/28, s/p sternal wash out/application of wound VAC. Chest remains open, will dose heparin conservatively.  Heparin level remains undetectable   Goal of Therapy:  Heparin level 0.2-0.3 units/ml Monitor platelets by anticoagulation protocol: Yes   Plan:  Continue heparin via the purge   Increase systemic heparin infusion to 600 units/hr Recheck heparin level in 6-8 hours  Narda Bonds, PharmD, [place] Pharmacist Phone: [phone]

## 2020-01-28 NOTE — Progress Notes (Signed)
  Echocardiogram 2D Echocardiogram has been performed.  Matilde Bash 01/28/2020, 10:04 AM

## 2020-01-28 NOTE — Progress Notes (Addendum)
NAME:  Corey CHAVOUS, MRN:  [id number], DOB:  1970-12-18, LOS: 47 ADMISSION DATE:  01/15/2020, CONSULTATION DATE: 12/31/2019 REFERRING MD: Aundra Dubin, CHIEF COMPLAINT: Respiratory failure post Impella  HPI/course in hospital  49 year old man admitted with A. fib/RVR, torsades, cardiogenic shock [EF 15] requiring Impella, CABG x5, AKI requiring initiation of CRRT  Past Medical History:    has a past medical history of Anginal pain ([place]), Anxiety, Arthritis, Automatic implantable cardioverter-defibrillator in situ (2007), Bipolar disorder ([place]), CAD (coronary artery disease), Cancer ([place]) (2013), CHF (congestive heart failure) ([place]), Chondrocalcinosis of right knee (1/66/0630), Chronic systolic heart failure ([place]), Depression, Diabetes uncomplicated adult-type II, Dilated cardiomyopathy ([place]), Dyslipidemia, Dysrhythmia, GERD (gastroesophageal reflux disease), Gout, unspecified, Hepatic steatosis (2011), History of kidney stones, echocardiogram, Hypertension, Obesity (BMI 30.0-34.9), Pacemaker, Paroxysmal atrial fibrillation ([place]), Peptic ulcer, Shortness of breath, and Sleep apnea.  Significant Hospital Events:  12/2 Afib with RVR. Cardioverted in ED. AICD did not fire. Milrinone for low co ox.  12/7 ICD fired, Torsades- likely from low K of 2.8.  12/8 Cath- multivessel disease w/ low CO. Volume overloaded.  12/11 Underwent Impella 5.5 insertion for persistent symptoms of forward failure with low cardiac indices. 12/12 Back to the OR for displacement of Impella. Extubated.  12/15 CABG x4, with LIMA-LAD, SVG-PDA/PLV, SVG-OM 12/16 Emergently opened chest at bedside for tamponade, clot compressing right atrium  12/16 To OR for Exploration of chest due to bleeding  12/19 Started CRRT 12/23 Sternal Closure in OR 12/24 Worsening shock.  Chest reopened at bedside with improvement in hemodynamics.  No mediastinal hematoma. 12/25 A flutter > rapid a paced to sinus rhythm.  Remains on high-dose pressors,  inotropes 12/28 Weaning vasopressor requirements.  For chest washout today.  Back in atrial fibrillation, refractory to increased amiodarone and attempted overdrive pacing.  DCCV to NSR. Tolerating fluid removal via CRRT.  Consults:  PCCM, nephrology, cardiology, cardiothoracic surgery  Procedures:  12/2 R PICC >> 12/11 right ax impella 5.5 >> 12/15 ETT >> 12/15 right femoral arterial sheath >> 12/19 right femoral HD >> 12/23 chest tube-right pleural, left pleural , mediastinal  >>  Significant Diagnostic Tests:    Micro Data:  Blood culture 12/14 > negative  Blood culture 12/16 > negative Respiratory culture 12/20 > rare candida albicans Sternal wound culture 12/23 > no growth Fungal cx sternal wound 12/23 >> neg Fungal cx w/stain sternal wound 12/23 >> AFB sternal wound 12/23 >>   Antimicrobials:  Vancomycin 12/16 >> Meropenem 12/16 >>  Interval history:   VAC dressing changed today; wound looks much less edematous.  Patient awake will follow commands. Now off vasopressin.  Minimal increase in norepinephrine requirement.  Objective   Blood pressure (!) 87/55, pulse 89, temperature 97.6 F (36.4 C), resp. rate (!) 28, height 6\' 3"  (1.905 m), weight 106.9 kg, SpO2 100 %. PAP: (24-44)/(14-28) 32/21 CVP:  [6 mmHg-18 mmHg] 8 mmHg PCWP:  [22 mmHg] 22 mmHg CO:  [5.3 L/min-5.8 L/min] 5.8 L/min CI:  [2.3 L/min/m2-2.5 L/min/m2] 2.5 L/min/m2  Vent Mode: PRVC FiO2 (%):  [40 %] 40 % Set Rate:  [28 bmp] 28 bmp Vt Set:  [510 mL] 510 mL PEEP:  [5 cmH20] 5 cmH20 Plateau Pressure:  [23 cmH20-26 cmH20] 24 cmH20   Intake/Output Summary (Last 24 hours) at 01/28/2020 1353 Last data filed at 01/28/2020 1300 Gross per 24 hour  Intake 5529.63 ml  Output 6178 ml  Net -648.37 ml   Filed Weights   01/26/20 0500 01/27/20 0500 01/28/20 0630  Weight:  109 kg 108.2 kg 106.9 kg   PAP: (24-44)/(14-28) 32/21 CVP:  [6 mmHg-18 mmHg] 8 mmHg PCWP:  [22 mmHg] 22 mmHg CO:  [5.3 L/min-5.8  L/min] 5.8 L/min CI:  [2.3 L/min/m2-2.5 L/min/m2] 2.5 L/min/m2  Examination: General:  Critically ill appearing middle aged M intubated sedated on CRRT and impella NAD  HEENT: [place]. ETT secure. Anicteric sclera.  Neuro: Opens eyes spontaneously, following simple commands. PERRL CV: RR cap refill <3 sec, open chest  PULM:  Symmetrical chest expansion. R sided expiratory wheeze. Mechanically ventilated  GI: Obese soft round ndnt + bowel sounds  Extremities: No obvious joint deformity, no clubbing. Pitting edema BUE BLE Skin: c/d/w no rash    Assessment & Plan:   Acute encephalopathy -CNS depressing meds, prolonged ICU course, renal failure  P -RASS goal  -3 with open chest -will schedule oxy -- hope this might help offset fent gtt dose -started low dose seroquel 12/29 -- need to closely monitor qtc -will add melatonin   Acute hypoxic respiratory failure  - continue full MV support, PRVC 6 cc/kg, rate 28 - ABG reviewed this morning, 7.384/ 42/ 93/ 25.4 - plans for trach concurrently with full chest closure  - VAP bundle/ PPI - intermittent CXR   Cardiogenic shock  Acute on chronic systolic HF -IABP, inotropes, pressors  CAD s/p CABG, c/b cardiac tamponade, hematoma evac. Chest remains open  P - continue to slowly wean vasopressors. On Epi, NE, vaso.  - Impella at P9 -heparin pwe pharmacist dosing - Plan to wean off NO 12/30 - ongoing CRRT  - chest tubes per TCTS  - continue ASA, atorvastatin - continue empiric abx vanc/ meropenem (plan to cont until chest closed) - follow wound cultures -Swan-Ganz catheter to be DC'd  Afib has been refractory to pacing/ meds, s/p DCCV 12/28  P - Amio gtt per HF  - heparin  DM2 with hyperglycemia Previously, plan was to maintain insulin gtt until chest closed. No clear plans for chest closure however P -will try to transition off on insulin gtt to basal + SSI (levemir 10units BID + moderate SSI) -DM coordinator consult   Acute  kidney injury with anuria -improving cardiorenal syndrome  P - remains on CRRT, goal neg 50-100/ hr - trend renal indices - prn bladder scan  -Reposition dialysis catheter to right IJ.  Acute blood loss anemia - trend CBC - transfuse for Hgb < 8  Daily Goals Checklist  Pain/Anxiety/Delirium protocol (if indicated): fent gtt PRN versed [facility] oxy VAP protocol (if indicated): bundle in place. Respiratory support goals: full ventilatory support. Wean once chest closed.  Blood pressure target: MAP>65 DVT prophylaxis:  Heparin Nutrition Status: high nutritional risk. EN per [place]. GI prophylaxis: pantoprazole Fluid status goals: CRRT to net UF -50 to -122ml/h Urinary catheter: n/a Central lines: PICC, PAC, HD catheter. Glucose control: Levemir 10 units BID, Moderate SSI  Mobility/therapy needs: bed rest  Antibiotic de-escalation: continue current antibiotics until chest closed. Daily labs: CBC, renal function, cooximetry Code Status: full  Family Communication: wife updated 12/20 Disposition: ICU   Critical care time: 69 min    Kipp Brood, MD [facility] ICU Physician [place]  Pager: [phone] Or Epic Secure Chat After hours: [phone].  01/28/2020, 1:56 PM

## 2020-01-28 NOTE — Progress Notes (Signed)
[facility] for Heparin Indication: Impella 5.5  Allergies  Allergen Reactions  . Orange Fruit Anaphylaxis, Hives and Other (See Comments)    Per Pt- Blisters around lips and Hives all over, also  . Penicillins Anaphylaxis    Did it involve swelling of the face/tongue/throat, SOB, or low BP? Yes Did it involve sudden or severe rash/hives, skin peeling, or any reaction on the inside of your mouth or nose? Yes Did you need to seek medical attention at a [facility] or doctor's office? No When did it last happen?childhood If all above answers are "NO", may proceed with cephalosporin use.  Corey [place] [Empagliflozin] Itching  . Basaglar Kwikpen [Insulin Glargine] Nausea And Vomiting    Patient Measurements: Height: 6\' 3"  (190.5 cm) Weight: 106.9 kg (235 lb 10.8 oz) IBW/kg (Calculated) : 84.5 Heparin dosing wt: 101 kg  Vital Signs: Temp: 97.6 F (36.4 C) (12/31 0945) Temp Source: Core (12/31 0800) BP: 87/55 (12/31 1159) Pulse Rate: 89 (12/31 1300)  Labs: Recent Labs    01/26/20 0401 01/26/20 0446 01/27/20 0359 01/27/20 0451 01/27/20 1631 01/28/20 0258 01/28/20 0447 01/28/20 1251  HGB 8.7*   < > 8.3* 8.8*  --  8.9* 9.5*  --   HCT 27.1*   < > 27.8* 26.0*  --  30.2* 28.0*  --   PLT 262  --  222  --   --  282  --   --   HEPARINUNFRC <0.10*   < > <0.10*  --  <0.10* <0.10*  --  <0.10*  CREATININE 1.73*   < > 1.60*  --  1.63* 1.70*  --   --    < > = values in this interval not displayed.    Estimated Creatinine Clearance: 68 mL/min (A) (by C-G formula based on SCr of 1.7 mg/dL (H)).   Assessment: 49 yo male s/p impella 5.5 placement. Pt started on heparinized purge and systemic heparin. Pt now s/p CABG 12/15 with Impella remaining in place c/b significant bleeding and hematoma requiring opened chest and reexploration in OR 12/16. Chest closed on 12/23, re-opened on 12/24.  Impella on P8 with stable purge pressures, purge flow rate 9.2  ml/hr (approx 460 units/hr heparin). Heparin level undetectable as expected, CBC low, stable. LDH drifting up 437. No s/sx of bleeding per nursing. Underwent cardioversion in OR 12/28, s/p sternal wash out/application of wound VAC. Chest remains open, will dose heparin conservatively.  Heparin level this afternoon undetectable, but was turned off for a bit prior to lab draw for line placement.  Goal of Therapy:  Heparin level 0.2-0.3 units/ml Monitor platelets by anticoagulation protocol: Yes   Plan:  Continue heparin via the purge   Increase systemic heparin infusion to 650 units/hr Recheck heparin level in 6-8 hours  Corey [place], Corey [place], Corey [place] Clinical Pharmacist  01/28/2020 2:20 PM   [facility] pharmacy phone numbers are listed on [email or web address]

## 2020-01-28 NOTE — Procedures (Signed)
Procedure: Right radial A-line insertion and insertion of a tri-Alysis catheter in the right IJ over a wire Surgeon: [name] Anesthesia: General Description: The right neck was cleansed and draped as a sterile field using ChloraPrep solution.  A wire was advanced through the central portion of the existing Cordis catheter which was then withdrawn.  The dialysis catheter was placed over the wire.  This was then secured at the skin.  Next the right wrist was cleansed and draped with ChloraPrep.  The area was anesthetized with lidocaine.  The right radial artery was cannulated by modified Seldinger technique and the catheter was secured at the skin.  Patient tolerated these procedures well Disposition: Continue ICU care  Donnamaria Shands Z. [name], Ali Chukson

## 2020-01-29 DIAGNOSIS — I4891 Unspecified atrial fibrillation: Secondary | ICD-10-CM | POA: Diagnosis not present

## 2020-01-29 DIAGNOSIS — R570 Cardiogenic shock: Secondary | ICD-10-CM | POA: Diagnosis not present

## 2020-01-29 LAB — POCT I-STAT 7, (LYTES, BLD GAS, ICA,H+H)
Acid-Base Excess: 1 mmol/L (ref 0.0–2.0)
Acid-Base Excess: 0 mmol/L (ref 0.0–2.0)
Bicarbonate: 26.8 mmol/L (ref 20.0–28.0)
Bicarbonate: 26.9 mmol/L (ref 20.0–28.0)
Calcium, Ion: 1.17 mmol/L (ref 1.15–1.40)
Calcium, Ion: 1.2 mmol/L (ref 1.15–1.40)
HCT: 25 % — ABNORMAL LOW (ref 39.0–52.0)
HCT: 28 % — ABNORMAL LOW (ref 39.0–52.0)
Hemoglobin: 8.5 g/dL — ABNORMAL LOW (ref 13.0–17.0)
Hemoglobin: 9.5 g/dL — ABNORMAL LOW (ref 13.0–17.0)
O2 Saturation: 99 %
O2 Saturation: 97 %
Patient temperature: 97.8
Patient temperature: 96.4
Potassium: 4.8 mmol/L (ref 3.5–5.1)
Potassium: 4.4 mmol/L (ref 3.5–5.1)
Sodium: 133 mmol/L — ABNORMAL LOW (ref 135–145)
Sodium: 135 mmol/L (ref 135–145)
TCO2: 28 mmol/L (ref 22–32)
TCO2: 28 mmol/L (ref 22–32)
pCO2 arterial: 47.1 mmHg (ref 32.0–48.0)
pCO2 arterial: 50.4 mmHg — ABNORMAL HIGH (ref 32.0–48.0)
pH, Arterial: 7.361 (ref 7.350–7.450)
pH, Arterial: 7.329 — ABNORMAL LOW (ref 7.350–7.450)
pO2, Arterial: 119 mmHg — ABNORMAL HIGH (ref 83.0–108.0)
pO2, Arterial: 99 mmHg (ref 83.0–108.0)

## 2020-01-29 LAB — RENAL FUNCTION PANEL
Albumin: 1.7 g/dL — ABNORMAL LOW (ref 3.5–5.0)
Albumin: 2.3 g/dL — ABNORMAL LOW (ref 3.5–5.0)
Anion gap: 9 (ref 5–15)
Anion gap: 8 (ref 5–15)
BUN: 37 mg/dL — ABNORMAL HIGH (ref 6–20)
BUN: 37 mg/dL — ABNORMAL HIGH (ref 6–20)
CO2: 24 mmol/L (ref 22–32)
CO2: 25 mmol/L (ref 22–32)
Calcium: 7.8 mg/dL — ABNORMAL LOW (ref 8.9–10.3)
Calcium: 8 mg/dL — ABNORMAL LOW (ref 8.9–10.3)
Chloride: 100 mmol/L (ref 98–111)
Chloride: 102 mmol/L (ref 98–111)
Creatinine, Ser: 1.65 mg/dL — ABNORMAL HIGH (ref 0.61–1.24)
Creatinine, Ser: 1.53 mg/dL — ABNORMAL HIGH (ref 0.61–1.24)
GFR, Estimated: 50 mL/min — ABNORMAL LOW (ref >60)
GFR, Estimated: 55 mL/min — ABNORMAL LOW (ref >60)
Glucose, Bld: 304 mg/dL — ABNORMAL HIGH (ref 70–99)
Glucose, Bld: 273 mg/dL — ABNORMAL HIGH (ref 70–99)
Phosphorus: 2.7 mg/dL (ref 2.5–4.6)
Phosphorus: 2.5 mg/dL (ref 2.5–4.6)
Potassium: 4.9 mmol/L (ref 3.5–5.1)
Potassium: 4.6 mmol/L (ref 3.5–5.1)
Sodium: 133 mmol/L — ABNORMAL LOW (ref 135–145)
Sodium: 135 mmol/L (ref 135–145)

## 2020-01-29 LAB — CBC WITH DIFFERENTIAL/PLATELET
Abs Immature Granulocytes: 0.45 10*3/uL — ABNORMAL HIGH (ref 0.00–0.07)
Basophils Absolute: 0.1 10*3/uL (ref 0.0–0.1)
Basophils Relative: 1 %
Eosinophils Absolute: 0.8 10*3/uL — ABNORMAL HIGH (ref 0.0–0.5)
Eosinophils Relative: 5 %
HCT: 26.9 % — ABNORMAL LOW (ref 39.0–52.0)
Hemoglobin: 8 g/dL — ABNORMAL LOW (ref 13.0–17.0)
Immature Granulocytes: 3 %
Lymphocytes Relative: 6 %
Lymphs Abs: 1 10*3/uL (ref 0.7–4.0)
MCH: 27.9 pg (ref 26.0–34.0)
MCHC: 29.7 g/dL — ABNORMAL LOW (ref 30.0–36.0)
MCV: 93.7 fL (ref 80.0–100.0)
Monocytes Absolute: 1.6 10*3/uL — ABNORMAL HIGH (ref 0.1–1.0)
Monocytes Relative: 11 %
Neutro Abs: 11.2 10*3/uL — ABNORMAL HIGH (ref 1.7–7.7)
Neutrophils Relative %: 74 %
Platelets: 305 10*3/uL (ref 150–400)
RBC: 2.87 MIL/uL — ABNORMAL LOW (ref 4.22–5.81)
RDW: 20.5 % — ABNORMAL HIGH (ref 11.5–15.5)
WBC: 15.1 10*3/uL — ABNORMAL HIGH (ref 4.0–10.5)
nRBC: 0.6 % — ABNORMAL HIGH (ref 0.0–0.2)

## 2020-01-29 LAB — COOXEMETRY PANEL
Carboxyhemoglobin: 2.2 % — ABNORMAL HIGH (ref 0.5–1.5)
Methemoglobin: 0.8 % (ref 0.0–1.5)
O2 Saturation: 72.3 %
Total hemoglobin: 7.6 g/dL — ABNORMAL LOW (ref 12.0–16.0)

## 2020-01-29 LAB — HEPARIN LEVEL (UNFRACTIONATED)
Heparin Unfractionated: <0.1 IU/mL — ABNORMAL LOW (ref 0.30–0.70)
Heparin Unfractionated: <0.1 IU/mL — ABNORMAL LOW (ref 0.30–0.70)
Heparin Unfractionated: <0.1 IU/mL — ABNORMAL LOW (ref 0.30–0.70)

## 2020-01-29 LAB — GLUCOSE, CAPILLARY
Glucose-Capillary: 235 mg/dL — ABNORMAL HIGH (ref 70–99)
Glucose-Capillary: 238 mg/dL — ABNORMAL HIGH (ref 70–99)
Glucose-Capillary: 253 mg/dL — ABNORMAL HIGH (ref 70–99)
Glucose-Capillary: 254 mg/dL — ABNORMAL HIGH (ref 70–99)
Glucose-Capillary: 262 mg/dL — ABNORMAL HIGH (ref 70–99)
Glucose-Capillary: 284 mg/dL — ABNORMAL HIGH (ref 70–99)

## 2020-01-29 LAB — LACTATE DEHYDROGENASE: LDH: 556 U/L — ABNORMAL HIGH (ref 98–192)

## 2020-01-29 LAB — MAGNESIUM: Magnesium: 2.2 mg/dL (ref 1.7–2.4)

## 2020-01-29 MED ORDER — MIDODRINE HCL 5 MG PO TABS
15.0000 mg | ORAL_TABLET | Freq: Three times a day (TID) | ORAL | Status: DC
  Administered 2020-01-29 – 2020-02-11 (×38): 15 mg
  Filled 2020-01-29 (×38): qty 3

## 2020-01-29 MED ORDER — LIDOCAINE 5 % EX PTCH
1.0000 | MEDICATED_PATCH | CUTANEOUS | Status: DC
  Administered 2020-01-29 – 2020-02-06 (×7): 1 via TRANSDERMAL
  Filled 2020-01-29 (×13): qty 1

## 2020-01-29 MED ORDER — MIDAZOLAM HCL 2 MG/2ML IJ SOLN
1.0000 mg | INTRAMUSCULAR | Status: DC | PRN
  Administered 2020-01-29 – 2020-01-31 (×8): 2 mg via INTRAVENOUS
  Administered 2020-02-01: 1 mg via INTRAVENOUS
  Administered 2020-02-01 (×2): 2 mg via INTRAVENOUS
  Administered 2020-02-02 (×2): 1 mg via INTRAVENOUS
  Administered 2020-02-02: 2 mg via INTRAVENOUS
  Filled 2020-01-29 (×15): qty 2

## 2020-01-29 MED ORDER — ALBUMIN HUMAN 25 % IV SOLN
25.0000 g | Freq: Four times a day (QID) | INTRAVENOUS | Status: AC
  Administered 2020-01-29 (×2): 25 g via INTRAVENOUS
  Filled 2020-01-29 (×2): qty 100

## 2020-01-29 MED ORDER — MIDAZOLAM BOLUS VIA INFUSION: 1.0000 mg | INTRAVENOUS | Status: DC | PRN

## 2020-01-29 MED ORDER — QUETIAPINE FUMARATE 50 MG PO TABS
50.0000 mg | ORAL_TABLET | Freq: Two times a day (BID) | ORAL | Status: DC
  Administered 2020-01-29 – 2020-02-08 (×12): 50 mg
  Filled 2020-01-29 (×14): qty 1

## 2020-01-29 NOTE — Procedures (Signed)
Patient was seen and evaluated on CRRT. No changes made at this time. Details per progress note.

## 2020-01-29 NOTE — Progress Notes (Signed)
[address]       [place] [address]             [phone]                 4 Days Post-Op Procedure(s) (LRB): STERNAL WASH OUT (N/A) APPLICATION OF WOUND VAC (N/A)   Events: Suction events overnight Decreased impella to P8 _______________________________________________________________ Vitals: BP 112/70   Pulse 93   Temp 98 F (36.7 C)   Resp (!) 28   Ht 6\' 3"  (1.905 m)   Wt 106 kg   SpO2 100%   BMI 29.21 kg/m   - Neuro: sedated  - Cardiovascular: junctional  Impella: p8 flow of 4.4.    Drips: amio 30, epi 5, milr 0.125, neo 32  iNO 1.6 ppm.   CVP:  [0 mmHg-19 mmHg] 13 mmHg  - Pulm:  Vent Mode: PRVC FiO2 (%):  [40 %] 40 % Set Rate:  [28 bmp] 28 bmp Vt Set:  [510 mL] 510 mL PEEP:  [5 cmH20] 5 cmH20 Plateau Pressure:  [19 cmH20-24 cmH20] 19 cmH20  ABG    Component Value Date/Time   PHART 7.361 01/29/2020 0435   PCO2ART 47.1 01/29/2020 0435   PO2ART 119 (H) 01/29/2020 0435   HCO3 26.8 01/29/2020 0435   TCO2 28 01/29/2020 0435   ACIDBASEDEF 2.0 01/10/2020 1555   O2SAT 72.3 01/29/2020 0442    - Abd: soft   .Intake/Output      12/31 0701 01/01 0700 01/01 0701 01/02 0700   P.O.     I.V. (mL/kg) 2568.9 (24.2) 250.1 (2.4)   Other 270.4 18.4   NG/GT 1790 140   IV Piggyback     Total Intake(mL/kg) 4629.3 (43.7) 408.5 (3.9)   Drains 300 50   Other 4868 345   Stool 825 0   Chest Tube 60 0   Total Output 6053 395   Net -1423.7 +13.5           _______________________________________________________________ Labs: CBC Latest Ref Rng & Units 01/29/2020 01/29/2020 01/28/2020  WBC 4.0 - 10.5 K/uL - 15.1(H) -  Hemoglobin 13.0 - 17.0 g/dL 8.5(L) 8.0(L) 9.5(L)  Hematocrit 39.0 - 52.0 % 25.0(L) 26.9(L) 28.0(L)  Platelets 150 - 400 K/uL - 305 -   CMP Latest Ref Rng & Units 01/29/2020 01/29/2020 01/28/2020  Glucose 70 - 99 mg/dL - 304(H) 263(H)  BUN 6 - 20 mg/dL - 37(H) 35(H)  Creatinine 0.61 - 1.24 mg/dL - 1.65(H) 1.61(H)  Sodium  135 - 145 mmol/L 133(L) 133(L) 132(L)  Potassium 3.5 - 5.1 mmol/L 4.8 4.9 4.3  Chloride 98 - 111 mmol/L - 100 101  CO2 22 - 32 mmol/L - 24 23  Calcium 8.9 - 10.3 mg/dL - 7.8(L) 7.7(L)  Total Protein 6.5 - 8.1 g/dL - - -  Total Bilirubin 0.3 - 1.2 mg/dL - - -  Alkaline Phos 38 - 126 U/L - - -  AST 15 - 41 U/L - - -  ALT 0 - 44 U/L - - -    CXR: pending  _______________________________________________________________  Assessment and Plan:  s/p Redosternotomy CABG on 12/15.  S/p re-exploration, closure, and re-exploration.  Currently has open chest  Neuro: sedated for open chest CV: consider adding vaso given high dose levo.  Continue impella support.  On iNO wean Pulm: continue vent support Renal: on CRRT, pulling fluid GI: on tube feeds.  Watch for bowel distension given pressor support Heme: stable ID: on abx  for open chest Endo: on insulin Dispo: continue ICU care   [place] 01/29/2020 10:05 AM

## 2020-01-29 NOTE — Procedures (Signed)
Patient was seen and evaluated on CRRT. [place] even today. Details per progress note.

## 2020-01-29 NOTE — Progress Notes (Signed)
[place] for Heparin Indication: Impella 5.5  Allergies  Allergen Reactions  . Orange Fruit Anaphylaxis, Hives and Other (See Comments)    Per Pt- Blisters around lips and Hives all over, also  . Penicillins Anaphylaxis    Did it involve swelling of the face/tongue/throat, SOB, or low BP? Yes Did it involve sudden or severe rash/hives, skin peeling, or any reaction on the inside of your mouth or nose? Yes Did you need to seek medical attention at a hospital or doctor's office? No When did it last happen?childhood If all above answers are "NO", may proceed with cephalosporin use.  [name] [Empagliflozin] Itching  . [name] [Insulin Glargine] Nausea And Vomiting    Patient Measurements: Height: 6\' 3"  (190.5 cm) Weight: 106 kg (233 lb 11 oz) IBW/kg (Calculated) : 84.5 Heparin dosing wt: 101 kg  Vital Signs: Temp: 97.3 F (36.3 C) (01/01 2300) Temp Source: Esophageal (01/01 2000) BP: 115/69 (01/01 1523) Pulse Rate: 84 (01/01 2310)  Labs: Recent Labs    01/27/20 0359 01/27/20 0451 01/28/20 0258 01/28/20 0447 01/28/20 1719 01/29/20 0139 01/29/20 0354 01/29/20 0435 01/29/20 1317 01/29/20 1546 01/29/20 1550 01/29/20 2303  HGB 8.3*   < > 8.9*   < >  --   --  8.0* 8.5*  --   --  9.5*  --   HCT 27.8*   < > 30.2*   < >  --   --  26.9* 25.0*  --   --  28.0*  --   PLT 222  --  282  --   --   --  305  --   --   --   --   --   HEPARINUNFRC <0.10*   < > <0.10*   < >  --  <0.10*  --   --  <0.10*  --   --  <0.10*  CREATININE 1.60*   < > 1.70*  --  1.61*  --  1.65*  --   --  1.53*  --   --    < > = values in this interval not displayed.    Estimated Creatinine Clearance: 75.2 mL/min (A) (by C-G formula based on SCr of 1.53 mg/dL (H)).   Assessment: 50 yo male s/p impella 5.5 placement, for heparin. Pt on heparinized purge and systemic heparin.  Heparin level remains undetectable  Goal of Therapy:  Heparin level 0.2-0.3  units/ml Monitor platelets by anticoagulation protocol: Yes   Plan:  Increase Heparin 950 units/hr Check heparin level in 8 hours.  Phillis Knack, PharmD, BCPS

## 2020-01-29 NOTE — Progress Notes (Signed)
[place] for Heparin Indication: Impella 5.5  Allergies  Allergen Reactions  . Orange Fruit Anaphylaxis, Hives and Other (See Comments)    Per Pt- Blisters around lips and Hives all over, also  . Penicillins Anaphylaxis    Did it involve swelling of the face/tongue/throat, SOB, or low BP? Yes Did it involve sudden or severe rash/hives, skin peeling, or any reaction on the inside of your mouth or nose? Yes Did you need to seek medical attention at a hospital or doctor's office? No When did it last happen?childhood If all above answers are "NO", may proceed with cephalosporin use.  [name] [Empagliflozin] Itching  . Basaglar Kwikpen [Insulin Glargine] Nausea And Vomiting    Patient Measurements: Height: 6\' 3"  (190.5 cm) Weight: 106 kg (233 lb 11 oz) IBW/kg (Calculated) : 84.5 Heparin dosing wt: 101 kg  Vital Signs: Temp: 96.5 F (35.8 C) (01/01 1445) Temp Source: Esophageal (01/01 1400) BP: 98/62 (01/01 1115) Pulse Rate: 80 (01/01 1445)  Labs: Recent Labs    01/27/20 0359 01/27/20 0451 01/28/20 0258 01/28/20 0447 01/28/20 1251 01/28/20 1719 01/29/20 0139 01/29/20 0354 01/29/20 0435 01/29/20 1317  HGB 8.3*   < > 8.9* 9.5*  --   --   --  8.0* 8.5*  --   HCT 27.8*   < > 30.2* 28.0*  --   --   --  26.9* 25.0*  --   PLT 222  --  282  --   --   --   --  305  --   --   HEPARINUNFRC <0.10*   < > <0.10*  --  <0.10*  --  <0.10*  --   --  <0.10*  CREATININE 1.60*   < > 1.70*  --   --  1.61*  --  1.65*  --   --    < > = values in this interval not displayed.    Estimated Creatinine Clearance: 69.7 mL/min (A) (by C-G formula based on SCr of 1.65 mg/dL (H)).   Assessment: 50 yo male s/p impella 5.5 placement. Pt started on heparinized purge and systemic heparin. Pt now s/p CABG 12/15 with Impella remaining in place c/b significant bleeding and hematoma requiring opened chest and reexploration in OR 12/16. Chest closed on 12/23,  re-opened on 12/24. Underwent cardioversion in OR 12/28, s/p sternal wash out/application of wound VAC. Chest remains open, will dose heparin conservatively.  Impella on P8 with stable purge pressures, purge flow rate 9.2 ml/hr (approx 460 units/hr heparin). Heparin level still undetectable with systemic heparin at 750 units/hr.  Goal of Therapy:  Heparin level 0.2-0.3 units/ml Monitor platelets by anticoagulation protocol: Yes   Plan:  Increase systemic Heparin 850 units/hr Check heparin level in 8 hours. Daily heparin level and CBC.  [name], [name] Reason, BCCP Clinical Pharmacist  01/29/2020 3:04 PM   [facility] pharmacy phone numbers are listed on [email or web address]

## 2020-01-29 NOTE — Progress Notes (Signed)
[place] for Heparin Indication: Impella 5.5  Allergies  Allergen Reactions  . Orange Fruit Anaphylaxis, Hives and Other (See Comments)    Per Pt- Blisters around lips and Hives all over, also  . Penicillins Anaphylaxis    Did it involve swelling of the face/tongue/throat, SOB, or low BP? Yes Did it involve sudden or severe rash/hives, skin peeling, or any reaction on the inside of your mouth or nose? Yes Did you need to seek medical attention at a hospital or doctor's office? No When did it last happen?childhood If all above answers are "NO", may proceed with cephalosporin use.  [name] [Empagliflozin] Itching  . Basaglar Kwikpen [Insulin Glargine] Nausea And Vomiting    Patient Measurements: Height: 6\' 3"  (190.5 cm) Weight: 106 kg (233 lb 11 oz) IBW/kg (Calculated) : 84.5 Heparin dosing wt: 101 kg  Vital Signs: Temp: 96.5 F (35.8 C) (01/01 0530) Temp Source: Oral (12/31 2000) Pulse Rate: 94 (01/01 0530)  Labs: Recent Labs    01/27/20 0359 01/27/20 0451 01/28/20 0258 01/28/20 0447 01/28/20 1251 01/28/20 1719 01/29/20 0139 01/29/20 0354 01/29/20 0435  HGB 8.3*   < > 8.9* 9.5*  --   --   --  8.0* 8.5*  HCT 27.8*   < > 30.2* 28.0*  --   --   --  26.9* 25.0*  PLT 222  --  282  --   --   --   --  305  --   HEPARINUNFRC <0.10*   < > <0.10*  --  <0.10*  --  <0.10*  --   --   CREATININE 1.60*   < > 1.70*  --   --  1.61*  --  1.65*  --    < > = values in this interval not displayed.    Estimated Creatinine Clearance: 69.7 mL/min (A) (by C-G formula based on SCr of 1.65 mg/dL (H)).   Assessment: 50 yo male s/p impella 5.5 placement, for heparin. Pt on heparinized purge and systemic heparin. Purge rate fairly stable overnight.  Heparin level remains undetectable  Goal of Therapy:  Heparin level 0.2-0.3 units/ml Monitor platelets by anticoagulation protocol: Yes   Plan:  Increase Heparin 750 units/hr Check heparin level  in 8 hours.  Phillis Knack, PharmD, BCPS

## 2020-01-29 NOTE — Progress Notes (Signed)
NAME:  Corey Palmer, MRN:  [id number], DOB:  [birth year], LOS: 8 ADMISSION DATE:  01/07/2020, CONSULTATION DATE: 01/14/2020 REFERRING MD: Aundra Dubin, CHIEF COMPLAINT: Respiratory failure post Impella  HPI/course in hospital  50 year old man admitted with A. fib/RVR, torsades, cardiogenic shock [EF 15] requiring Impella, CABG x5, AKI requiring initiation of CRRT  Past Medical History:    has a past medical history of Anginal pain ([place]), Anxiety, Arthritis, Automatic implantable cardioverter-defibrillator in situ (2007), Bipolar disorder ([place]), CAD (coronary artery disease), Cancer ([place]) (2013), CHF (congestive heart failure) ([place]), Chondrocalcinosis of right knee (05/06/8117), Chronic systolic heart failure ([place]), Depression, Diabetes uncomplicated adult-type II, Dilated cardiomyopathy ([place]), Dyslipidemia, Dysrhythmia, GERD (gastroesophageal reflux disease), Gout, unspecified, Hepatic steatosis (2011), History of kidney stones, echocardiogram, Hypertension, Obesity (BMI 30.0-34.9), Pacemaker, Paroxysmal atrial fibrillation ([place]), Peptic ulcer, Shortness of breath, and Sleep apnea.  Significant Hospital Events:  12/2 Afib with RVR. Cardioverted in ED. AICD did not fire. Milrinone for low co ox.  12/7 ICD fired, Torsades- likely from low K of 2.8.  12/8 Cath- multivessel disease w/ low CO. Volume overloaded.  12/11 Underwent Impella 5.5 insertion for persistent symptoms of forward failure with low cardiac indices. 12/12 Back to the OR for displacement of Impella. Extubated.  12/15 CABG x4, with LIMA-LAD, SVG-PDA/PLV, SVG-OM 12/16 Emergently opened chest at bedside for tamponade, clot compressing right atrium  12/16 To OR for Exploration of chest due to bleeding  12/19 Started CRRT 12/23 Sternal Closure in OR 12/24 Worsening shock.  Chest reopened at bedside with improvement in hemodynamics.  No mediastinal hematoma. 12/25 A flutter > rapid a paced to sinus rhythm.  Remains on high-dose pressors,  inotropes 12/28 Weaning vasopressor requirements.  For chest washout today.  Back in atrial fibrillation, refractory to increased amiodarone and attempted overdrive pacing.  DCCV to NSR. Tolerating fluid removal via CRRT.  Consults:  PCCM, nephrology, cardiology, cardiothoracic surgery  Procedures:  12/2 R PICC >> 12/11 right ax impella 5.5 >> 12/15 ETT >> 12/15 right femoral arterial sheath >> 12/19 right femoral HD >> 12/23 chest tube-right pleural, left pleural , mediastinal  >>  Significant Diagnostic Tests:    Micro Data:  Blood culture 12/14 > negative  Blood culture 12/16 > negative Respiratory culture 12/20 > rare candida albicans Sternal wound culture 12/23 > no growth Fungal cx sternal wound 12/23 >> neg Fungal cx w/stain sternal wound 12/23 >> AFB sternal wound 12/23 >>   Antimicrobials:  Vancomycin 12/16 >> Meropenem 12/16 >>  Interval history:   More awake today. Continues to tolerate fluid removal  Objective   Blood pressure 98/62, pulse 87, temperature 97.6 F (36.4 C), temperature source Esophageal, resp. rate (!) 28, height 6\' 3"  (1.905 m), weight 106 kg, SpO2 100 %. CVP:  [0 mmHg-19 mmHg] 13 mmHg  Vent Mode: PRVC FiO2 (%):  [40 %] 40 % Set Rate:  [28 bmp] 28 bmp Vt Set:  [510 mL] 510 mL PEEP:  [5 cmH20] 5 cmH20 Plateau Pressure:  [19 cmH20-22 cmH20] 19 cmH20   Intake/Output Summary (Last 24 hours) at 01/29/2020 1235 Last data filed at 01/29/2020 1200 Gross per 24 hour  Intake 4839.79 ml  Output 5650 ml  Net -810.21 ml   Filed Weights   01/27/20 0500 01/28/20 0630 01/29/20 0615  Weight: 108.2 kg 106.9 kg 106 kg   CVP:  [0 mmHg-19 mmHg] 13 mmHg  Examination: General:  Critically ill appearing middle aged M intubated sedated on CRRT and impella NAD  HEENT: [place]. ETT secure.  Anicteric sclera.  Neuro: Opens eyes spontaneously, following simple commands. PERRL CV: RR cap refill <3 sec, open chest  PULM:  Symmetrical chest expansion. R sided  expiratory wheeze. Mechanically ventilated  GI: Obese soft round ndnt + bowel sounds  Extremities: No obvious joint deformity, no clubbing. Edema much improved. Skin: c/d/w no rash    Assessment & Plan:   Acute encephalopathy -CNS depressing meds, prolonged ICU course, renal failure  P -Lighten sedation targeting RASS of -2/-1 -will schedule oxy -- hope this might help offset fent gtt dose -Increase Seroquel -- need to closely monitor qtc -will add melatonin   Acute hypoxic respiratory failure  - continue full MV support, PRVC 6 cc/kg, rate 28 - ABG reviewed this morning, 7.384/ 42/ 93/ 25.4 - plans for trach concurrently with full chest closure  - VAP bundle/ PPI - intermittent CXR   Cardiogenic shock  Acute on chronic systolic HF -IABP, inotropes, pressors  CAD s/p CABG, c/b cardiac tamponade, hematoma evac. Chest remains open  P - continue to slowly wean vasopressors. On Epi, NE, vaso.  - Impella at P8 -heparin pwe pharmacist dosing - ongoing CRRT  - chest tubes per TCTS  - continue ASA, atorvastatin - continue empiric abx vanc/ meropenem (plan to cont until chest closed) - follow wound cultures  Afib has been refractory to pacing/ meds, s/p DCCV 12/28  P - Amio gtt per HF  - heparin  DM2 with hyperglycemia Previously, plan was to maintain insulin gtt until chest closed. No clear plans for chest closure however P -will try to transition off on insulin gtt to basal + SSI (levemir 10units BID + moderate SSI) -DM coordinator consult   Acute kidney injury with anuria -improving cardiorenal syndrome  P - remains on CRRT, goal neg 50-100/ hr - trend renal indices - prn bladder scan  -Reposition dialysis catheter to right IJ.  Acute blood loss anemia - trend CBC - transfuse for Hgb < 8  Daily Goals Checklist  Pain/Anxiety/Delirium protocol (if indicated): fent gtt, PRN versed [facility] oxy VAP protocol (if indicated): bundle in place. Respiratory support goals:  full ventilatory support. Wean once chest closed.  Blood pressure target: MAP>65 DVT prophylaxis:  Heparin Nutrition Status: high nutritional risk. EN per [place]. GI prophylaxis: pantoprazole Fluid status goals: CRRT to net UF -50 to -176ml/h Urinary catheter: n/a Central lines: PICC, PAC, HD catheter. Glucose control: Levemir 10 units BID, Moderate SSI  Mobility/therapy needs: bed rest  Antibiotic de-escalation: continue current antibiotics until chest closed. Daily labs: CBC, renal function, cooximetry Code Status: full  Family Communication: wife updated 12/20 Disposition: ICU   Critical care time: 35 min    Kipp Brood, MD [facility] ICU Physician [place]  Pager: [phone] Or Epic Secure Chat After hours: [phone].  01/29/2020, 12:35 PM

## 2020-01-29 NOTE — Progress Notes (Addendum)
Patient ID: Corey Palmer, male   DOB: 19-Apr-1970, 50 y.o.   MRN: [id number]     Advanced Heart Failure Rounding Note  PCP-Cardiologist: No primary care provider on file.   Subjective:    - 12/7 Torsades -ICD shock x1.  - 12/11 Impella 5.5 placed.  - 12/12 Impella repositioned in OR - 12/15 CABG x 4 with LIMA-LAD, SVG-PDA/PLV, SVG-OM - 12/16 DCCV afib.  Back to OR to re-open chest and again to evacuate hematoma.  - 12/19 CVVH initiated  - 12/23 Chest closed - 12/24 Chest reopened - 12/26 RAP back into NSR - 12/27 back in atrial fibrillation, unable to rapid atrial pace out.   - 12/28 To OR, chest partially closed and wound vac placed.  DCCV to NSR.  - 12/31 Antibiotics stopped. Swan removed, HD catheter moved from right femoral to right IJ.   On NE 32  Epi 5, milrinone 0.125 and Impella at P8.  Off vasopressin.  Impella decreased from P9 to P8 due to suction alarms.   CVP 12 with co-ox 72%.  CVVH aiming for net 50 cc/hr negative, weight down 2 lbs.   HR in 90s and regular, ?NSR.  On amiodarone gtt 30 mg/hr.   Impella 5.5 P-8  Flow 4.6 Heparin systemically Waveforms ok. No alarms LDH 357 => 349 => 333=>377=>437=>556  Echo (12/10): EF <20%, moderate LV dilation, normal RV size with mildly decreased systolic function.    Objective:   Weight Range: 106 kg Body mass index is 29.21 kg/m.   Vital Signs:   Temp:  [96.4 F (35.8 C)-98.5 F (36.9 C)] 98.5 F (36.9 C) (01/01 0800) Pulse Rate:  [85-107] 100 (01/01 0800) Resp:  [18-32] 28 (01/01 0800) BP: (87-112)/(55-70) 112/70 (01/01 0736) SpO2:  [100 %] 100 % (01/01 0800) Arterial Line BP: (82-107)/(57-72) 82/57 (12/31 1200) FiO2 (%):  [40 %] 40 % (01/01 0736) Weight:  [106 kg] 106 kg (01/01 0615) Last BM Date: 01/27/21  Weight change: Filed Weights   01/27/20 0500 01/28/20 0630 01/29/20 0615  Weight: 108.2 kg 106.9 kg 106 kg    Intake/Output:   Intake/Output Summary (Last 24 hours) at 01/29/2020 0928 Last data  filed at 01/29/2020 0900 Gross per 24 hour  Intake 4644.25 ml  Output 5961 ml  Net -1316.75 ml      Physical Exam  General: NAD, sedated on vent Neck: JVP 10-12 cm, no thyromegaly or thyroid nodule.  Lungs: Decreased at bases.  CV: Nondisplaced PMI.  Heart regular S1/S2, no S3/S4, no murmur.  1+ edema to knees.  Abdomen: Soft, nontender, no hepatosplenomegaly, no distention.  Skin: Intact without lesions or rashes.  Neurologic: Wakes up per nursing and follows commands.  Extremities: No clubbing or cyanosis.  HEENT: Normal.   Telemetry  Regular rhythm in 90s, ?NSR (personally reviewed)    Labs    CBC Recent Labs    01/28/20 0258 01/28/20 0447 01/29/20 0354 01/29/20 0435  WBC 16.8*  --  15.1*  --   NEUTROABS 13.1*  --  11.2*  --   HGB 8.9*   < > 8.0* 8.5*  HCT 30.2*   < > 26.9* 25.0*  MCV 94.7  --  93.7  --   PLT 282  --  305  --    < > = values in this interval not displayed.   Basic Metabolic Panel Recent Labs    01/28/20 0258 01/28/20 0447 01/28/20 1719 01/29/20 0354 01/29/20 0435  NA 134*   < > 132*  133* 133*  K 4.8   < > 4.3 4.9 4.8  CL 100  --  101 100  --   CO2 25  --  23 24  --   GLUCOSE 222*  --  263* 304*  --   BUN 30*  --  35* 37*  --   CREATININE 1.70*  --  1.61* 1.65*  --   CALCIUM 8.0*  --  7.7* 7.8*  --   MG 2.3  --   --  2.2  --   PHOS 3.1  --  2.6 2.7  --    < > = values in this interval not displayed.   Liver Function Tests Recent Labs    01/28/20 1719 01/29/20 0354  ALBUMIN 1.8* 1.7*   No results for input(s): LIPASE, AMYLASE in the last 72 hours. Cardiac Enzymes No results for input(s): CKTOTAL, CKMB, CKMBINDEX, TROPONINI in the last 72 hours.  BNP: BNP (last 3 results) Recent Labs    12/28/2019 1619  BNP 577.5*    ProBNP (last 3 results) No results for input(s): PROBNP in the last 8760 hours.   D-Dimer No results for input(s): DDIMER in the last 72 hours. Hemoglobin A1C No results for input(s): HGBA1C in the last 72  hours. Fasting Lipid Panel No results for input(s): CHOL, HDL, LDLCALC, TRIG, CHOLHDL, LDLDIRECT in the last 72 hours. Thyroid Function Tests No results for input(s): TSH, T4TOTAL, T3FREE, THYROIDAB in the last 72 hours.  Invalid input(s): FREET3  Other results:   Imaging    DG CHEST PORT 1 VIEW  Result Date: 01/28/2020 CLINICAL DATA:  Central line placement. EXAM: PORTABLE CHEST 1 VIEW COMPARISON:  01/28/2020 at 5:20 a.m. and earlier studies. FINDINGS: Right internal jugular dual lumen central venous catheter has its tip in the lower superior vena cava. Endotracheal tube, enteric tube and bilateral chest tubes are stable. Impella device also stable. Lung base opacities are similar left and improved on the right from the earlier exam, consistent with atelectasis. No convincing pulmonary edema. No pneumothorax. IMPRESSION: 1. Right internal jugular dual-lumen central venous catheter is well positioned, tip projecting in the lower superior vena cava. No pneumothorax. 2. Improved lung aeration since the earlier exam with partial clearing at the right lung base. Residual lung base opacities consistent with atelectasis. Electronically Signed   By: Lajean Manes M.D.   On: 01/28/2020 11:43   ECHOCARDIOGRAM LIMITED  Result Date: 01/28/2020    ECHOCARDIOGRAM LIMITED REPORT   Patient Name:   MICHALL NOFFKE Date of Exam: 01/28/2020 Medical Rec #:  [id number]     Height:       75.0 in Accession #:    [id number]    Weight:       235.7 lb Date of Birth:  April 20, 1970     BSA:          2.353 m Patient Age:    62 years      BP:           93/60 mmHg Patient Gender: M             HR:           88 bpm. Exam Location:  Inpatient Procedure: Limited Echo Indications:    CHF  History:        Patient has prior history of Echocardiogram examinations, most                 recent 01/17/2020. CHF, CAD, Prior CABG and Defibrillator,  Arrythmias:Atrial Fibrillation; Risk Factors:Diabetes. Impella.  Sonographer:     Dustin Flock Referring Phys: [place] Comments: Impella positioning. IMPRESSIONS  1. Left ventricular ejection fraction, by estimation, is 20%. The left ventricle demonstrates global hypokinesis with septal dyskinesis. The left ventricular internal cavity size was moderately dilated. Impella catheter noted at 5.37 cm.  2. Right ventricular systolic function is moderately reduced. The right ventricular size is normal.  3. Left atrial size was mildly dilated.  4. Right atrial size was mildly dilated.  5. Limited echo FINDINGS  Left Ventricle: Left ventricular ejection fraction, by estimation, is 20%. The left ventricle demonstrates global hypokinesis. The left ventricular internal cavity size was moderately dilated. There is no left ventricular hypertrophy. Right Ventricle: The right ventricular size is normal. Right ventricular systolic function is moderately reduced. Left Atrium: Left atrial size was mildly dilated. Right Atrium: Right atrial size was mildly dilated. Additional Comments: A pacer wire is visualized in the right ventricle. Loralie Champagne MD Electronically signed by Loralie Champagne MD Signature Date/Time: 01/28/2020/12:13:26 PM    Final      Medications:     Scheduled Medications: . sodium chloride   Intravenous Once  . vitamin C  500 mg Per Tube TID  . aspirin  324 mg Per Tube Daily  . atorvastatin  80 mg Per Tube Daily  . busPIRone  10 mg Per Tube TID  . chlorhexidine gluconate (MEDLINE KIT)  15 mL Mouth Rinse BID  . Chlorhexidine Gluconate Cloth  6 each Topical Daily  . darbepoetin (ARANESP) injection - NON-DIALYSIS  60 mcg Subcutaneous Q Sat-1800  . feeding supplement (PROSource TF)  45 mL Per Tube TID  . fiber  1 packet Per Tube BID  . FLUoxetine  40 mg Per Tube BID  . insulin aspart  0-15 Units Subcutaneous Q4H  . insulin detemir  10 Units Subcutaneous BID  . mouth rinse  15 mL Mouth Rinse 10 times per day  . melatonin  5 mg Per Tube Daily  .  midodrine  10 mg Per Tube TID WC  . multivitamin  1 tablet Per Tube QHS  . oxyCODONE  5 mg Per Tube Q8H  . pantoprazole sodium  40 mg Per Tube Daily  . QUEtiapine  25 mg Per Tube QHS  . sodium chloride flush  10-40 mL Intracatheter Q12H  . sodium chloride flush  3 mL Intravenous Q12H  . thiamine  100 mg Per Tube Daily  . valproic acid  250 mg Per Tube BID    Infusions: .  prismasol BGK 4/2.5 500 mL/hr at 01/29/20 0250  .  prismasol BGK 4/2.5 200 mL/hr at 01/28/20 0059  . sodium chloride Stopped (01/04/2020 1646)  . sodium chloride 10 mL/hr at 01/15/2020 1302  . sodium chloride Stopped (01/16/20 1032)  . sodium chloride 60 mL/hr at 01/29/20 0900  . albumin human 25 g (01/29/20 0756)  . amiodarone 30 mg/hr (01/29/20 0900)  . epinephrine 5 mcg/min (01/29/20 0900)  . feeding supplement (PIVOT 1.5 CAL) 1,000 mL (01/29/20 0654)  . fentaNYL infusion INTRAVENOUS 125 mcg/hr (01/29/20 0900)  . heparin 750 Units/hr (01/29/20 0900)  . impella catheter heparin 50 unit/mL in dextrose 5% 9 mL/hr at 01/21/20 0015  . lactated ringers 20 mL/hr at 01/29/20 0900  . midazolam Stopped (01/28/20 0817)  . milrinone 0.125 mcg/kg/min (01/29/20 0900)  . norepinephrine (LEVOPHED) Adult infusion 32 mcg/min (01/29/20 0900)  . prismasol BGK 4/2.5 1,500 mL/hr at 01/29/20 0247  . vasopressin  Stopped (01/28/20 1136)    PRN Medications: sodium chloride, sodium chloride, sodium chloride, artificial tears, bisacodyl **OR** bisacodyl, dextrose, docusate, fentaNYL, fentaNYL (SUBLIMAZE) injection, fentaNYL (SUBLIMAZE) injection, heparin, heparin, metoprolol tartrate, midazolam, midazolam, midazolam, ondansetron (ZOFRAN) IV, polyethylene glycol, sodium chloride flush, sodium chloride flush     Assessment/Plan   1. Cardiogenic shock/acute on chronic systolic CHF: Initially nonischemic cardiomyopathy. Possible familial cardiomyopathy as both parents had cardiomyopathy and died at around 37.However, [employer] gene testing  did not show any common mutation for cardiomyopathy.  However, this admission noted to have severe 3 vessel disease so suspect component of ischemic cardiomyopathy. St Jude ICD. Echo in 8/20 with EF 15% and mildly decreased RV function. Echo this admission with EF < 20%, moderate LV dilation, RV mildly reduced, severe LAE, no significant MR.Low output HF with markedly low EF.  He has a long history of cardiomyopathy (20 yrs), tends to minimize symptoms.  Cardiorenal syndrome with creatinine up to 2.2,  stabilized on milrinone and Impella 5.5. SCr 1.44 day of CABG. CABG 12/15.  Post-op shock, back to OR 12/16 to open chest (chest wall compartment syndrome) and again to evacuate hematoma.  TEE post-op with severe RV dysfunction.  CVVHD initiated 12/19 for volume removal in setting of rising Scr/decreased UOP. Suspect ATN. S/p chest closure 12/23. Hemodynamics much worse on 12/24 with rising pressor demands. Co-ox 36%.  Bedside echo severe biventricular dysfunction with marked septal bounce. Chest re-opened 12/24.  Was atrial paced back to NSR on 12/26 with improvement in hemodynamics but back in atrial fibrillation on 12/27 and unable to pace out, DCCV to NSR on 12/28. Remains on pressors (NE 32, Epi 5, milrinone 0.125). Now off vasopressin.  CVP 12, co-ox 72%.   - Aim for CVVH net negative 50 hr today, no change.  - Hopefully close chest early next week.     - Continue Impella to P8 to facilitate weaning of vasoactive meds.  LDH trending up but Impella in good position on echo yesterday with no alarms currently.  Platelets and hgb stable.  Hopefully not significant hemolysis from Impella (also may help to have decreased from P9 to P8 last night). - Wean NE and epinephrine as able.  Slow process with RV dysfunction.    - Increase midodrine to 15 mg tid to try to wean pressors.  2. Atrial fibrillation: H/o PAF.  He was on dofetilide in the remote past but this was stopped due to noncompliance. He had an  upper GI bleed from antral ulcers in 3/12. He was seen by GI and was deemed safe to restart anticoagulation as long as he remains on a PPI.  No apparent recurrence of AF until just prior to this admission, was cardioverted back to NSR in ER but went back into atrial fibrillation.  Post-op afib, DCCV to NSR/BiV pacing am 12/16 -> back to AF. Rapid atrial pacing back to SR on 12/26 -> back in AF on 12/27 and unable to pace out. 12/28 DCCV to NSR.  He is in regular rhythm this morning, not sure that I can see P waves.  - ECG.  - Continue IV amiodarone 30 mg/hr.   - He is now back on heparin gtt.  - Did not have Maze with CABG.    3. CAD:   Cath this admission with severe 3 vessel disease. CABG x 4 on 12/15.  - Continue atorvastatin, ASA.  - continue current management 4. Acute on chronic hypoxemic respiratory failure: Vent per CCM.  -  CCM following - Will need tracheostomy when chest fully closed.  5. AKI on CKD stage 3: Likely ATN/cardiorenal.  On CVVH.  CVP 12 today.   - Aim for net negative 50cc/hr today via CVVH. - Makes little urine.   6. Diabetes: Insulin.  7. Hyponatremia: Resolved.  8. Torsades/ICD shock: 12/7/212 in hospital event, in setting of severe hypokalemia and hypomagnesemia.   9. Gout: h/o gout. Complained of rt knee pain c/w previous flares - treated w/ prednisone burst (completed).  10: ID: Treating for PNA and also with chest wall open.   - off antibiotics now per CCM/CT surgery.  11. FEN: TFs ongoing.     CRITICAL CARE Performed by: Loralie Champagne  Total critical care time: 40 minutes  Critical care time was exclusive of separately billable procedures and treating other patients.  Critical care was necessary to treat or prevent imminent or life-threatening deterioration.  Critical care was time spent personally by me on the following activities: development of treatment plan with patient and/or surrogate as well as nursing, discussions with consultants, evaluation  of patient's response to treatment, examination of patient, obtaining history from patient or surrogate, ordering and performing treatments and interventions, ordering and review of laboratory studies, ordering and review of radiographic studies, pulse oximetry and re-evaluation of patient's condition.  Loralie Champagne 01/29/2020 9:28 AM

## 2020-01-29 NOTE — Progress Notes (Signed)
[facility] NEPHROLOGY PROGRESS NOTE  Assessment/ Plan:  #AKI/CKD stage III- presumably due to ischemic ATN in setting of cardiogenic shock s/p CABG.  CRRT started 01/16/20. All fluids 4K/2.5Ca: Pre-filter 500 ml/hr, post filter 200 ml/hr, dialysate 1500 ml/hr. No heparin due to hematoma. Right femoral HD catheter placed 01/16/20 switched to [place] on 01/28/20.  -Cont CV, tolerating well; adjusting UF as needed even for today -Continue current CRRT prescription.  # CAD s/p CABG complicated by cardiogenic shock and post-op hematoma. secondary closure on 12/23 then had to be re opened.  Went to the OR for washout on 12/28.  # Cardiogenic shock- underlying familial cardiomyopathy s/p redo CABG. Remains on pressors: Levophed, epinephrine, and vasopressin. Impella and milrinone. Vaso off for now. Continue mgmt per primary  # ABLA- s/p postoperative bleeding into chest cavity s/p re-exploration for tamponade/hematoma. CT surgery following.stable, on aranesp 72mg weekly started 12/25. Hgb 8.5 today ctm  #Hypophosphatemia- due to CRRT. replete prn  # Atrial fibrillation - s/p DCCV on 12/16.  On amiodarone.  # VDRF- per primary svc.  #-Leukocytosis.stopped antibiotics, CTM  # Hypoalbuminemia: albumin 1.7 today. Give 50g of albumin. Goal at least >2.   Subjective: Intubated and sedated. No urine output. Stable on CRRT -1.4 L yesterday. Vaso no longer on  Objective Vital signs in last 24 hours: Vitals:   01/29/20 0700 01/29/20 0715 01/29/20 0730 01/29/20 0736  BP:    112/70  Pulse: (!) 103 (!) 102 (!) 107 (!) 104  Resp: (!) 28 (!) 26 18 (!) 32  Temp: 98.1 F (36.7 C) 98.2 F (36.8 C) 98.2 F (36.8 C) 98.1 F (36.7 C)  TempSrc:      SpO2: 100% 100% 100% 100%  Weight:      Height:       Weight change: -0.9 kg  Intake/Output Summary (Last 24 hours) at 01/29/2020 0756 Last data filed at 01/29/2020 0700 Gross per 24 hour  Intake 4629.26 ml  Output 6053 ml  Net  -1423.74 ml       Labs: Basic Metabolic Panel: Recent Labs  Lab 01/28/20 0258 01/28/20 0447 01/28/20 1719 01/29/20 0354 01/29/20 0435  NA 134*   < > 132* 133* 133*  K 4.8   < > 4.3 4.9 4.8  CL 100  --  101 100  --   CO2 25  --  23 24  --   GLUCOSE 222*  --  263* 304*  --   BUN 30*  --  35* 37*  --   CREATININE 1.70*  --  1.61* 1.65*  --   CALCIUM 8.0*  --  7.7* 7.8*  --   PHOS 3.1  --  2.6 2.7  --    < > = values in this interval not displayed.   Liver Function Tests: Recent Labs  Lab 01/28/20 0258 01/28/20 1719 01/29/20 0354  ALBUMIN 1.8* 1.8* 1.7*   No results for input(s): LIPASE, AMYLASE in the last 168 hours. No results for input(s): AMMONIA in the last 168 hours. CBC: Recent Labs  Lab 12/28/2019 1548 01/27/2020 1555 01/26/20 0401 01/26/20 0446 01/27/20 0359 01/27/20 0451 01/28/20 0258 01/28/20 0447 01/29/20 0354 01/29/20 0435  WBC 19.6*  --  18.1*  --  14.4*  --  16.8*  --  15.1*  --   NEUTROABS  --   --  15.5*  --  11.9*  --  13.1*  --  11.2*  --   HGB 8.6*   < > 8.7*   < >  8.3*   < > 8.9* 9.5* 8.0* 8.5*  HCT 28.1*   < > 27.1*   < > 27.8*   < > 30.2* 28.0* 26.9* 25.0*  MCV 92.7  --  90.6  --  92.4  --  94.7  --  93.7  --   PLT 257  --  262  --  222  --  282  --  305  --    < > = values in this interval not displayed.   Cardiac Enzymes: No results for input(s): CKTOTAL, CKMB, CKMBINDEX, TROPONINI in the last 168 hours. CBG: Recent Labs  Lab 01/28/20 1607 01/28/20 1949 01/28/20 2347 01/29/20 0400 01/29/20 0750  GLUCAP 243* 253* 224* 284* 253*    Iron Studies: No results for input(s): IRON, TIBC, TRANSFERRIN, FERRITIN in the last 72 hours. Studies/Results: DG CHEST PORT 1 VIEW  Result Date: 01/28/2020 CLINICAL DATA:  Central line placement. EXAM: PORTABLE CHEST 1 VIEW COMPARISON:  01/28/2020 at 5:20 a.m. and earlier studies. FINDINGS: Right internal jugular dual lumen central venous catheter has its tip in the lower superior vena cava.  Endotracheal tube, enteric tube and bilateral chest tubes are stable. Impella device also stable. Lung base opacities are similar left and improved on the right from the earlier exam, consistent with atelectasis. No convincing pulmonary edema. No pneumothorax. IMPRESSION: 1. Right internal jugular dual-lumen central venous catheter is well positioned, tip projecting in the lower superior vena cava. No pneumothorax. 2. Improved lung aeration since the earlier exam with partial clearing at the right lung base. Residual lung base opacities consistent with atelectasis. Electronically Signed   By: Lajean Manes M.D.   On: 01/28/2020 11:43   DG Chest Port 1 View  Result Date: 01/28/2020 CLINICAL DATA:  Open heart surgery EXAM: PORTABLE CHEST 1 VIEW COMPARISON:  01/27/2020 FINDINGS: Support devices remain in stable position. Bilateral chest tubes in place. No pneumothorax. Cardiomegaly with vascular congestion. Low volumes with bilateral airspace opacities, worsening since prior study, likely worsening edema and/or atelectasis. No acute bony abnormality. IMPRESSION: Stable support devices. Worsening bilateral airspace disease, likely a combination of edema and bibasilar atelectasis. Low lung volumes. Electronically Signed   By: Rolm Baptise M.D.   On: 01/28/2020 07:32   ECHOCARDIOGRAM LIMITED  Result Date: 01/28/2020    ECHOCARDIOGRAM LIMITED REPORT   [name] Date of Exam: 01/28/2020 Medical Rec #:  [id number]     Height:       75.0 in Accession #:    [id number]    Weight:       235.7 lb Date of Birth:  [birth year]     BSA:          2.353 m Patient Age:    50 years      BP:           93/60 mmHg Patient Gender: M             HR:           88 bpm. Exam Location:  Inpatient Procedure: Limited Echo Indications:    CHF  History:        Patient has prior history of Echocardiogram examinations, most                 recent 01/22/2020. CHF, CAD, Prior CABG and Defibrillator,                  Arrythmias:Atrial Fibrillation; Risk Factors:Diabetes. Impella.  Sonographer:  Dustin Flock Referring Phys: Ferguson Comments: Impella positioning. IMPRESSIONS  1. Left ventricular ejection fraction, by estimation, is 20%. The left ventricle demonstrates global hypokinesis with septal dyskinesis. The left ventricular internal cavity size was moderately dilated. Impella catheter noted at 5.37 cm.  2. Right ventricular systolic function is moderately reduced. The right ventricular size is normal.  3. Left atrial size was mildly dilated.  4. Right atrial size was mildly dilated.  5. Limited echo FINDINGS  Left Ventricle: Left ventricular ejection fraction, by estimation, is 20%. The left ventricle demonstrates global hypokinesis. The left ventricular internal cavity size was moderately dilated. There is no left ventricular hypertrophy. Right Ventricle: The right ventricular size is normal. Right ventricular systolic function is moderately reduced. Left Atrium: Left atrial size was mildly dilated. Right Atrium: Right atrial size was mildly dilated. Additional Comments: A pacer wire is visualized in the right ventricle. Loralie Champagne MD Electronically signed by Loralie Champagne MD Signature Date/Time: 01/28/2020/12:13:26 PM    Final     Medications: Infusions: .  prismasol BGK 4/2.5 500 mL/hr at 01/29/20 0250  .  prismasol BGK 4/2.5 200 mL/hr at 01/28/20 0059  . sodium chloride Stopped (01/28/2020 1646)  . sodium chloride 10 mL/hr at 01/23/2020 1302  . sodium chloride Stopped (01/16/20 1032)  . sodium chloride Stopped (01/27/20 1510)  . albumin human    . amiodarone 30 mg/hr (01/29/20 0700)  . epinephrine 3 mcg/min (01/29/20 0700)  . feeding supplement (PIVOT 1.5 CAL) 1,000 mL (01/29/20 0654)  . fentaNYL infusion INTRAVENOUS 125 mcg/hr (01/29/20 0700)  . heparin 750 Units/hr (01/29/20 0722)  . impella catheter heparin 50 unit/mL in dextrose 5% 9 mL/hr at 01/21/20 0015  .  lactated ringers 20 mL/hr at 01/29/20 0700  . midazolam Stopped (01/28/20 0817)  . milrinone 0.125 mcg/kg/min (01/29/20 0700)  . norepinephrine (LEVOPHED) Adult infusion 28 mcg/min (01/29/20 0700)  . prismasol BGK 4/2.5 1,500 mL/hr at 01/29/20 0247  . vasopressin Stopped (01/28/20 1136)    Scheduled Medications: . sodium chloride   Intravenous Once  . vitamin C  500 mg Per Tube TID  . aspirin  324 mg Per Tube Daily  . atorvastatin  80 mg Per Tube Daily  . busPIRone  10 mg Per Tube TID  . chlorhexidine gluconate (MEDLINE KIT)  15 mL Mouth Rinse BID  . Chlorhexidine Gluconate Cloth  6 each Topical Daily  . darbepoetin (ARANESP) injection - NON-DIALYSIS  60 mcg Subcutaneous Q Sat-1800  . feeding supplement (PROSource TF)  45 mL Per Tube TID  . fiber  1 packet Per Tube BID  . FLUoxetine  40 mg Per Tube BID  . insulin aspart  0-15 Units Subcutaneous Q4H  . insulin detemir  10 Units Subcutaneous BID  . mouth rinse  15 mL Mouth Rinse 10 times per day  . melatonin  5 mg Per Tube Daily  . midodrine  10 mg Per Tube TID WC  . multivitamin  1 tablet Per Tube QHS  . oxyCODONE  5 mg Per Tube Q8H  . pantoprazole sodium  40 mg Per Tube Daily  . QUEtiapine  25 mg Per Tube QHS  . sodium chloride flush  10-40 mL Intracatheter Q12H  . sodium chloride flush  3 mL Intravenous Q12H  . thiamine  100 mg Per Tube Daily  . valproic acid  250 mg Per Tube BID    have reviewed scheduled and prn medications.  Physical Exam:  General: Critically ill male,  intubated, sedated, multiple IV lines and pressors. Heart:normal rate, no audible murmur, wound vac in place on chest Lungs ventilated, bilateral chest rise, Coarse breath sound. Abdomen:soft, non-distended Extremities: Bilateral lower extremity edema present, unchanged Dialysis Access: Right IJ HD cath, temp  Shadoe Cryan J Jorje Vanatta 01/29/2020,7:56 AM  LOS: 30 days

## 2020-01-29 DEATH — deceased

## 2020-01-30 ENCOUNTER — Inpatient Hospital Stay (HOSPITAL_COMMUNITY): Payer: BC Managed Care – PPO

## 2020-01-30 DIAGNOSIS — I4891 Unspecified atrial fibrillation: Secondary | ICD-10-CM | POA: Diagnosis not present

## 2020-01-30 DIAGNOSIS — R570 Cardiogenic shock: Secondary | ICD-10-CM | POA: Diagnosis not present

## 2020-01-30 LAB — CBC WITH DIFFERENTIAL/PLATELET
Abs Immature Granulocytes: 0.33 10*3/uL — ABNORMAL HIGH (ref 0.00–0.07)
Basophils Absolute: 0.1 10*3/uL (ref 0.0–0.1)
Basophils Relative: 1 %
Eosinophils Absolute: 0.7 10*3/uL — ABNORMAL HIGH (ref 0.0–0.5)
Eosinophils Relative: 5 %
HCT: 25.2 % — ABNORMAL LOW (ref 39.0–52.0)
Hemoglobin: 7.5 g/dL — ABNORMAL LOW (ref 13.0–17.0)
Immature Granulocytes: 3 %
Lymphocytes Relative: 7 %
Lymphs Abs: 0.9 10*3/uL (ref 0.7–4.0)
MCH: 28.4 pg (ref 26.0–34.0)
MCHC: 29.8 g/dL — ABNORMAL LOW (ref 30.0–36.0)
MCV: 95.5 fL (ref 80.0–100.0)
Monocytes Absolute: 1.6 10*3/uL — ABNORMAL HIGH (ref 0.1–1.0)
Monocytes Relative: 12 %
Neutro Abs: 9.7 10*3/uL — ABNORMAL HIGH (ref 1.7–7.7)
Neutrophils Relative %: 72 %
Platelets: 339 10*3/uL (ref 150–400)
RBC: 2.64 MIL/uL — ABNORMAL LOW (ref 4.22–5.81)
RDW: 21.2 % — ABNORMAL HIGH (ref 11.5–15.5)
WBC: 13.3 10*3/uL — ABNORMAL HIGH (ref 4.0–10.5)
nRBC: 0.3 % — ABNORMAL HIGH (ref 0.0–0.2)

## 2020-01-30 LAB — POCT I-STAT 7, (LYTES, BLD GAS, ICA,H+H)
Acid-Base Excess: 1 mmol/L (ref 0.0–2.0)
Acid-base deficit: 1 mmol/L (ref 0.0–2.0)
Bicarbonate: 27.7 mmol/L (ref 20.0–28.0)
Bicarbonate: 27 mmol/L (ref 20.0–28.0)
Calcium, Ion: 1.2 mmol/L (ref 1.15–1.40)
Calcium, Ion: 1.22 mmol/L (ref 1.15–1.40)
HCT: 59 % — ABNORMAL HIGH (ref 39.0–52.0)
HCT: 25 % — ABNORMAL LOW (ref 39.0–52.0)
Hemoglobin: 20.1 g/dL — ABNORMAL HIGH (ref 13.0–17.0)
Hemoglobin: 8.5 g/dL — ABNORMAL LOW (ref 13.0–17.0)
O2 Saturation: 99 %
O2 Saturation: 99 %
Patient temperature: 98.5
Potassium: 4.4 mmol/L (ref 3.5–5.1)
Potassium: 4.8 mmol/L (ref 3.5–5.1)
Sodium: 135 mmol/L (ref 135–145)
Sodium: 135 mmol/L (ref 135–145)
TCO2: 29 mmol/L (ref 22–32)
TCO2: 29 mmol/L (ref 22–32)
pCO2 arterial: 57.6 mmHg — ABNORMAL HIGH (ref 32.0–48.0)
pCO2 arterial: 52 mmHg — ABNORMAL HIGH (ref 32.0–48.0)
pH, Arterial: 7.291 — ABNORMAL LOW (ref 7.350–7.450)
pH, Arterial: 7.322 — ABNORMAL LOW (ref 7.350–7.450)
pO2, Arterial: 156 mmHg — ABNORMAL HIGH (ref 83.0–108.0)
pO2, Arterial: 143 mmHg — ABNORMAL HIGH (ref 83.0–108.0)

## 2020-01-30 LAB — COOXEMETRY PANEL
Carboxyhemoglobin: 1.8 % — ABNORMAL HIGH (ref 0.5–1.5)
Methemoglobin: 0.9 % (ref 0.0–1.5)
O2 Saturation: 69.9 %
Total hemoglobin: 10.3 g/dL — ABNORMAL LOW (ref 12.0–16.0)

## 2020-01-30 LAB — RENAL FUNCTION PANEL
Albumin: 2.2 g/dL — ABNORMAL LOW (ref 3.5–5.0)
Albumin: 2.1 g/dL — ABNORMAL LOW (ref 3.5–5.0)
Anion gap: 7 (ref 5–15)
Anion gap: 7 (ref 5–15)
BUN: 38 mg/dL — ABNORMAL HIGH (ref 6–20)
BUN: 40 mg/dL — ABNORMAL HIGH (ref 6–20)
CO2: 25 mmol/L (ref 22–32)
CO2: 25 mmol/L (ref 22–32)
Calcium: 8.1 mg/dL — ABNORMAL LOW (ref 8.9–10.3)
Calcium: 8.1 mg/dL — ABNORMAL LOW (ref 8.9–10.3)
Chloride: 102 mmol/L (ref 98–111)
Chloride: 102 mmol/L (ref 98–111)
Creatinine, Ser: 1.58 mg/dL — ABNORMAL HIGH (ref 0.61–1.24)
Creatinine, Ser: 1.5 mg/dL — ABNORMAL HIGH (ref 0.61–1.24)
GFR, Estimated: 53 mL/min — ABNORMAL LOW (ref >60)
GFR, Estimated: 56 mL/min — ABNORMAL LOW (ref >60)
Glucose, Bld: 270 mg/dL — ABNORMAL HIGH (ref 70–99)
Glucose, Bld: 284 mg/dL — ABNORMAL HIGH (ref 70–99)
Phosphorus: 2.6 mg/dL (ref 2.5–4.6)
Phosphorus: 2.7 mg/dL (ref 2.5–4.6)
Potassium: 4.5 mmol/L (ref 3.5–5.1)
Potassium: 4.7 mmol/L (ref 3.5–5.1)
Sodium: 134 mmol/L — ABNORMAL LOW (ref 135–145)
Sodium: 134 mmol/L — ABNORMAL LOW (ref 135–145)

## 2020-01-30 LAB — GLUCOSE, CAPILLARY
Glucose-Capillary: 161 mg/dL — ABNORMAL HIGH (ref 70–99)
Glucose-Capillary: 221 mg/dL — ABNORMAL HIGH (ref 70–99)
Glucose-Capillary: 239 mg/dL — ABNORMAL HIGH (ref 70–99)
Glucose-Capillary: 245 mg/dL — ABNORMAL HIGH (ref 70–99)
Glucose-Capillary: 264 mg/dL — ABNORMAL HIGH (ref 70–99)
Glucose-Capillary: 280 mg/dL — ABNORMAL HIGH (ref 70–99)

## 2020-01-30 LAB — MAGNESIUM: Magnesium: 2.2 mg/dL (ref 1.7–2.4)

## 2020-01-30 LAB — PREPARE RBC (CROSSMATCH)

## 2020-01-30 LAB — LACTATE DEHYDROGENASE: LDH: 457 U/L — ABNORMAL HIGH (ref 98–192)

## 2020-01-30 LAB — HEPARIN LEVEL (UNFRACTIONATED)
Heparin Unfractionated: <0.1 IU/mL — ABNORMAL LOW (ref 0.30–0.70)
Heparin Unfractionated: <0.1 IU/mL — ABNORMAL LOW (ref 0.30–0.70)

## 2020-01-30 LAB — HEMOGLOBIN AND HEMATOCRIT, BLOOD
HCT: 26.6 % — ABNORMAL LOW (ref 39.0–52.0)
Hemoglobin: 7.9 g/dL — ABNORMAL LOW (ref 13.0–17.0)

## 2020-01-30 LAB — POCT ACTIVATED CLOTTING TIME: Activated Clotting Time: 148 seconds

## 2020-01-30 MED ORDER — SODIUM CHLORIDE 0.9% IV SOLUTION: Freq: Once | INTRAVENOUS | Status: AC

## 2020-01-30 MED ORDER — OXYCODONE HCL 5 MG PO TABS
10.0000 mg | ORAL_TABLET | Freq: Three times a day (TID) | ORAL | Status: DC
  Administered 2020-01-30 – 2020-02-07 (×22): 10 mg
  Filled 2020-01-30 (×23): qty 2

## 2020-01-30 MED ORDER — DARBEPOETIN ALFA 100 MCG/0.5ML IJ SOSY
100.0000 ug | PREFILLED_SYRINGE | INTRAMUSCULAR | Status: DC
  Administered 2020-02-05 – 2020-03-04 (×5): 100 ug via SUBCUTANEOUS
  Filled 2020-01-30 (×6): qty 0.5

## 2020-01-30 NOTE — Progress Notes (Signed)
[facility] for Heparin Indication: Impella 5.5  Allergies  Allergen Reactions  . Orange Fruit Anaphylaxis, Hives and Other (See Comments)    Per Pt- Blisters around lips and Hives all over, also  . Penicillins Anaphylaxis    Did it involve swelling of the face/tongue/throat, SOB, or low BP? Yes Did it involve sudden or severe rash/hives, skin peeling, or any reaction on the inside of your mouth or nose? Yes Did you need to seek medical attention at a hospital or doctor's office? No When did it last happen?childhood If all above answers are "NO", may proceed with cephalosporin use.  [name] [Empagliflozin] Itching  . [name] [Insulin Glargine] Nausea And Vomiting    Patient Measurements: Height: 6\' 3"  (190.5 cm) Weight: 103.7 kg (228 lb 9.9 oz) IBW/kg (Calculated) : 84.5 Heparin dosing wt: 101 kg  Vital Signs: Temp: 98.6 F (37 C) (01/02 1000) Temp Source: Axillary (01/02 1000) BP: 102/57 (01/02 0719) Pulse Rate: 84 (01/02 1045)  Labs: Recent Labs    01/28/20 0258 01/28/20 0447 01/29/20 0354 01/29/20 0435 01/29/20 1546 01/29/20 1550 01/29/20 2303 01/30/20 0451 01/30/20 0500 01/30/20 0530 01/30/20 0905  HGB 8.9*   < > 8.0*   < >  --  9.5*  --   --  7.5* 20.1*  --   HCT 30.2*   < > 26.9*   < >  --  28.0*  --   --  25.2* 59.0*  --   PLT 282  --  305  --   --   --   --   --  339  --   --   HEPARINUNFRC <0.10*   < >  --    < >  --   --  <0.10* <0.10*  --   --  <0.10*  CREATININE 1.70*   < > 1.65*  --  1.53*  --   --   --  1.58*  --   --    < > = values in this interval not displayed.    Estimated Creatinine Clearance: 72.1 mL/min (A) (by C-G formula based on SCr of 1.58 mg/dL (H)).   Assessment: 50 yo male s/p impella 5.5 placement. Pt started on heparinized purge and systemic heparin. Pt now s/p CABG 12/15 with Impella remaining in place c/b significant bleeding and hematoma requiring opened chest and  reexploration in OR 12/16. Chest closed on 12/23, re-opened on 12/24. Underwent cardioversion in OR 12/28, s/p sternal wash out/application of wound VAC. Chest remains open, will dose heparin conservatively.  Impella on P8 with stable purge pressures, purge flow rate 9.2 ml/hr (approx 460 units/hr heparin). Heparin level still undetectable with systemic heparin at 950 units/hr.   Goal of Therapy:  Heparin level 0.2-0.3 units/ml Monitor platelets by anticoagulation protocol: Yes   Plan:  Discussed with Dr. Kipp Brood. Usual heparin "cap" at 1000 units/hr systemically.  Since Impella running well - will plan to increase his systemic heparin up to 1000 units/hr and then not titrate further.  Also has heparin at 460 units/hr through purge, so getting 1460 units/hr (~10 units/kg).  Daily heparin level and CBC.  [name], [name] Reason, BCCP Clinical Pharmacist  01/30/2020 11:10 AM   [facility] pharmacy phone numbers are listed on [email or web address]

## 2020-01-30 NOTE — Progress Notes (Addendum)
NAME:  Corey Palmer, MRN:  [id number], DOB:  07/27/1970, LOS: 49 ADMISSION DATE:  01/12/2020, CONSULTATION DATE: 01/03/2020 REFERRING MD: Aundra Dubin, CHIEF COMPLAINT: Respiratory failure post Impella  HPI/course in hospital  50 year old man admitted with A. fib/RVR, torsades, cardiogenic shock [EF 15] requiring Impella, CABG x5, AKI requiring initiation of CRRT  Past Medical History:    has a past medical history of Anginal pain ([place]), Anxiety, Arthritis, Automatic implantable cardioverter-defibrillator in situ (2007), Bipolar disorder ([place]), CAD (coronary artery disease), Cancer ([place]) (2013), CHF (congestive heart failure) ([place]), Chondrocalcinosis of right knee (04/18/252), Chronic systolic heart failure ([place]), Depression, Diabetes uncomplicated adult-type II, Dilated cardiomyopathy ([place]), Dyslipidemia, Dysrhythmia, GERD (gastroesophageal reflux disease), Gout, unspecified, Hepatic steatosis (2011), History of kidney stones, echocardiogram, Hypertension, Obesity (BMI 30.0-34.9), Pacemaker, Paroxysmal atrial fibrillation ([place]), Peptic ulcer, Shortness of breath, and Sleep apnea.  Significant Hospital Events:  12/2 Afib with RVR. Cardioverted in ED. AICD did not fire. Milrinone for low co ox.  12/7 ICD fired, Torsades- likely from low K of 2.8.  12/8 Cath- multivessel disease w/ low CO. Volume overloaded.  12/11 Underwent Impella 5.5 insertion for persistent symptoms of forward failure with low cardiac indices. 12/12 Back to the OR for displacement of Impella. Extubated.  12/15 CABG x4, with LIMA-LAD, SVG-PDA/PLV, SVG-OM 12/16 Emergently opened chest at bedside for tamponade, clot compressing right atrium  12/16 To OR for Exploration of chest due to bleeding  12/19 Started CRRT 12/23 Sternal Closure in OR 12/24 Worsening shock.  Chest reopened at bedside with improvement in hemodynamics.  No mediastinal hematoma. 12/25 A flutter > rapid a paced to sinus rhythm.  Remains on high-dose pressors,  inotropes 12/28 Weaning vasopressor requirements.  For chest washout today.  Back in atrial fibrillation, refractory to increased amiodarone and attempted overdrive pacing.  DCCV to NSR. Tolerating fluid removal via CRRT.  Consults:  PCCM, nephrology, cardiology, cardiothoracic surgery  Procedures:  12/2 R PICC >> 12/11 right ax impella 5.5 >> 12/15 ETT >> 12/15 right femoral arterial sheath >> 12/19 right femoral HD >> 12/23 chest tube-right pleural, left pleural , mediastinal  >>  Significant Diagnostic Tests:    Micro Data:  Blood culture 12/14 > negative  Blood culture 12/16 > negative Respiratory culture 12/20 > rare candida albicans Sternal wound culture 12/23 > no growth Fungal cx sternal wound 12/23 >> neg Fungal cx w/stain sternal wound 12/23 >> AFB sternal wound 12/23 >>   Antimicrobials:  Vancomycin 12/16 >> Meropenem 12/16 >>  Interval history:   More awake today.  Decreasing pressor requirement.  Objective   Blood pressure (!) 96/55, pulse 82, temperature 98.4 F (36.9 C), temperature source Axillary, resp. rate (!) 28, height 6\' 3"  (1.905 m), weight 103.7 kg, SpO2 100 %. CVP:  [4 mmHg-20 mmHg] 6 mmHg  Vent Mode: PRVC FiO2 (%):  [40 %] 40 % Set Rate:  [28 bmp] 28 bmp Vt Set:  [510 mL] 510 mL PEEP:  [5 cmH20] 5 cmH20 Plateau Pressure:  [24 cmH20-26 cmH20] 24 cmH20   Intake/Output Summary (Last 24 hours) at 01/30/2020 1258 Last data filed at 01/30/2020 1200 Gross per 24 hour  Intake 4748.31 ml  Output 7435 ml  Net -2686.69 ml   Filed Weights   01/28/20 0630 01/29/20 0615 01/30/20 0434  Weight: 106.9 kg 106 kg 103.7 kg   CVP:  [4 mmHg-20 mmHg] 6 mmHg  Examination: General:  Critically ill appearing middle aged M intubated sedated on CRRT and impella NAD  HEENT: [place]. ETT secure.  Anicteric sclera.  Neuro: Opens eyes spontaneously, following simple commands. PERRL CV: RR cap refill <3 sec, open chest  PULM:  Symmetrical chest expansion. R sided  expiratory wheeze. Mechanically ventilated  GI: Obese soft round ndnt + bowel sounds  Extremities: No obvious joint deformity, no clubbing.  Now minimal dependent edema Skin: c/d/w no rash    Assessment & Plan:   Critically ill due to acute hypoxic respiratory failure requiring mechanical ventilation following cardiac surgery. Critically ill due to mixed vasoplegic and cardiogenic shock following high risk CABG. Post cardiac surgery RV dysfunction CAD s/p CABG, c/b cardiac tamponade, hematoma evac. Chest closed with VAC.  Afib DM2 with hyperglycemia Acute kidney injury with anuria -improving cardiorenal syndrome  Acute blood loss anemia  Plan:  -Continue full ventilatory support, on minimal settings -Run fluid even on CRRT as CVP for today.  Observe for renal recovery as now making more urine -Milrinone stopped continue to wean epinephrine as tolerated -Wean norepinephrine as tolerated -Anticipate ability to close chest this week -Increased enteral sedatives to facilitate fentanyl weaning -Continue adjunctive therapies for vasoplegia including midodrine and vitamin C  Daily Goals Checklist  Pain/Anxiety/Delirium protocol (if indicated): fent gtt, PRN versed [facility] oxy VAP protocol (if indicated): bundle in place. Respiratory support goals: full ventilatory support. Wean once chest closed.  Blood pressure target: MAP>65 DVT prophylaxis:  Heparin Nutrition Status: high nutritional risk. EN per [place]. GI prophylaxis: pantoprazole Fluid status goals: CRRT to net even fluid Urinary catheter: n/a Central lines: PICC, PAC, HD catheter. Glucose control: Levemir 10 units BID, Moderate SSI  Mobility/therapy needs: bed rest  Antibiotic de-escalation: continue current antibiotics until chest closed. Daily labs: CBC, renal function, cooximetry Code Status: full  Family Communication: wife updated 1/2 Disposition: ICU   Critical care time: 38 min    Kipp Brood, MD [facility] ICU  Physician [place]  Pager: [phone] Or Epic Secure Chat After hours: [phone].  01/30/2020, 12:58 PM

## 2020-01-30 NOTE — Progress Notes (Signed)
[facility] NEPHROLOGY PROGRESS NOTE  Assessment/ Plan:  #AKI/CKD stage III- presumably due to ischemic ATN in setting of cardiogenic shock s/p CABG.  CRRT started 01/16/20. All fluids 4K/2.5Ca: Pre-filter 500 ml/hr, post filter 200 ml/hr, dialysate 1500 ml/hr. No heparin due to hematoma. Right femoral HD catheter placed 01/16/20 switched to [place] on 01/28/20. Actually some UOP in last 24 hours -Cont CV, tolerating well; adjusting UF as needed -Continue current CRRT prescription. -Bladder scans given recent UOP  # CAD s/p CABG complicated by cardiogenic shock and post-op hematoma. secondary closure on 12/23 then had to be re opened.  Went to the OR for washout on 12/28.  # Cardiogenic shock- underlying familial cardiomyopathy s/p redo CABG. Remains on pressors: Levophed and epi. Impella and milrinone. Continue mgmt per primary  # ABLA- s/p postoperative bleeding into chest cavity s/p re-exploration for tamponade/hematoma. CT surgery following.stable, on aranesp 36mg weekly started 12/25. Inc to 1034m and transfuse as needed; hgb 7.5 today   #Hypophosphatemia- due to CRRT. replete prn  # Atrial fibrillation - s/p DCCV on 12/16.  On amiodarone.  # VDRF- per primary svc.  #-Leukocytosis.stopped antibiotics, CTM  # Hypoalbuminemia: albumin improved s/p 50g yesterday. Redose as needed   Subjective: Intubated and sedated. 500cc of UOP documented in last 24 hours. After albumin yesterday pressures improved and UF continued. No other changes.  Objective Vital signs in last 24 hours: Vitals:   01/30/20 0500 01/30/20 0600 01/30/20 0700 01/30/20 0719  BP:    (!) 102/57  Pulse: 85 85 84 84  Resp: (!) 25 (!) 22 (!) 28 (!) 28  Temp:  (!) 97.5 F (36.4 C)  (!) 96 F (35.6 C)  TempSrc:  Axillary    SpO2: 100% 100% 100% 100%  Weight:      Height:       Weight change: -2.3 kg  Intake/Output Summary (Last 24 hours) at 01/30/2020 0807 Last data filed at 01/30/2020  0700 Gross per 24 hour  Intake 5032.62 ml  Output 7159 ml  Net -2126.38 ml       Labs: Basic Metabolic Panel: Recent Labs  Lab 01/29/20 0354 01/29/20 0435 01/29/20 1546 01/29/20 1550 01/30/20 0500 01/30/20 0530  NA 133*   < > 135 135 134* 135  K 4.9   < > 4.6 4.4 4.5 4.4  CL 100  --  102  --  102  --   CO2 24  --  25  --  25  --   GLUCOSE 304*  --  273*  --  270*  --   BUN 37*  --  37*  --  38*  --   CREATININE 1.65*  --  1.53*  --  1.58*  --   CALCIUM 7.8*  --  8.0*  --  8.1*  --   PHOS 2.7  --  2.5  --  2.6  --    < > = values in this interval not displayed.   Liver Function Tests: Recent Labs  Lab 01/29/20 0354 01/29/20 1546 01/30/20 0500  ALBUMIN 1.7* 2.3* 2.2*   No results for input(s): LIPASE, AMYLASE in the last 168 hours. No results for input(s): AMMONIA in the last 168 hours. CBC: Recent Labs  Lab 01/26/20 0401 01/26/20 0446 01/27/20 0359 01/27/20 0451 01/28/20 0258 01/28/20 0447 01/29/20 0354 01/29/20 0435 01/29/20 1550 01/30/20 0500 01/30/20 0530  WBC 18.1*  --  14.4*  --  16.8*  --  15.1*  --   --  13.3*  --   NEUTROABS 15.5*  --  11.9*  --  13.1*  --  11.2*  --   --  PENDING  --   HGB 8.7*   < > 8.3*   < > 8.9*   < > 8.0*   < > 9.5* 7.5* 20.1*  HCT 27.1*   < > 27.8*   < > 30.2*   < > 26.9*   < > 28.0* 25.2* 59.0*  MCV 90.6  --  92.4  --  94.7  --  93.7  --   --  95.5  --   PLT 262  --  222  --  282  --  305  --   --  339  --    < > = values in this interval not displayed.   Cardiac Enzymes: No results for input(s): CKTOTAL, CKMB, CKMBINDEX, TROPONINI in the last 168 hours. CBG: Recent Labs  Lab 01/29/20 1508 01/29/20 2006 01/29/20 2349 01/30/20 0458 01/30/20 0738  GLUCAP 238* 262* 235* 161* 264*    Iron Studies: No results for input(s): IRON, TIBC, TRANSFERRIN, FERRITIN in the last 72 hours. Studies/Results: DG CHEST PORT 1 VIEW  Result Date: 01/28/2020 CLINICAL DATA:  Central line placement. EXAM: PORTABLE CHEST 1 VIEW  COMPARISON:  01/28/2020 at 5:20 a.m. and earlier studies. FINDINGS: Right internal jugular dual lumen central venous catheter has its tip in the lower superior vena cava. Endotracheal tube, enteric tube and bilateral chest tubes are stable. Impella device also stable. Lung base opacities are similar left and improved on the right from the earlier exam, consistent with atelectasis. No convincing pulmonary edema. No pneumothorax. IMPRESSION: 1. Right internal jugular dual-lumen central venous catheter is well positioned, tip projecting in the lower superior vena cava. No pneumothorax. 2. Improved lung aeration since the earlier exam with partial clearing at the right lung base. Residual lung base opacities consistent with atelectasis. Electronically Signed   By: Lajean Manes M.D.   On: 01/28/2020 11:43   ECHOCARDIOGRAM LIMITED  Result Date: 01/28/2020    ECHOCARDIOGRAM LIMITED REPORT   Patient Name:   WYLIE COON Date of Exam: 01/28/2020 Medical Rec #:  [id number]     Height:       75.0 in Accession #:    [id number]    Weight:       235.7 lb Date of Birth:  1970/07/10     BSA:          2.353 m Patient Age:    50 years      BP:           93/60 mmHg Patient Gender: M             HR:           88 bpm. Exam Location:  Inpatient Procedure: Limited Echo Indications:    CHF  History:        Patient has prior history of Echocardiogram examinations, most                 recent 01/24/2020. CHF, CAD, Prior CABG and Defibrillator,                 Arrythmias:Atrial Fibrillation; Risk Factors:Diabetes. Impella.  Sonographer:    Dustin Flock Referring Phys: [place] Comments: Impella positioning. IMPRESSIONS  1. Left ventricular ejection fraction, by estimation, is 20%. The left ventricle demonstrates global hypokinesis with septal dyskinesis. The left ventricular internal cavity size was moderately  dilated. Impella catheter noted at 5.37 cm.  2. Right ventricular systolic function is moderately  reduced. The right ventricular size is normal.  3. Left atrial size was mildly dilated.  4. Right atrial size was mildly dilated.  5. Limited echo FINDINGS  Left Ventricle: Left ventricular ejection fraction, by estimation, is 20%. The left ventricle demonstrates global hypokinesis. The left ventricular internal cavity size was moderately dilated. There is no left ventricular hypertrophy. Right Ventricle: The right ventricular size is normal. Right ventricular systolic function is moderately reduced. Left Atrium: Left atrial size was mildly dilated. Right Atrium: Right atrial size was mildly dilated. Additional Comments: A pacer wire is visualized in the right ventricle. Loralie Champagne MD Electronically signed by Loralie Champagne MD Signature Date/Time: 01/28/2020/12:13:26 PM    Final     Medications: Infusions: .  prismasol BGK 4/2.5 500 mL/hr at 01/29/20 2100  .  prismasol BGK 4/2.5 200 mL/hr at 01/30/20 0200  . sodium chloride Stopped (01/14/2020 1646)  . sodium chloride 10 mL/hr at 12/30/2019 1302  . sodium chloride Stopped (01/16/20 1032)  . sodium chloride Stopped (01/29/20 0955)  . amiodarone 30 mg/hr (01/30/20 0700)  . epinephrine 5 mcg/min (01/30/20 0700)  . feeding supplement (PIVOT 1.5 CAL) 1,000 mL (01/29/20 2333)  . fentaNYL infusion INTRAVENOUS 150 mcg/hr (01/30/20 0700)  . heparin 950 Units/hr (01/30/20 0700)  . impella catheter heparin 50 unit/mL in dextrose 5% 9 mL/hr at 01/21/20 0015  . lactated ringers 20 mL/hr at 01/30/20 0700  . milrinone 0.125 mcg/kg/min (01/30/20 0700)  . norepinephrine (LEVOPHED) Adult infusion 22 mcg/min (01/30/20 0700)  . prismasol BGK 4/2.5 1,500 mL/hr at 01/30/20 0400  . vasopressin Stopped (01/28/20 1136)    Scheduled Medications: . sodium chloride   Intravenous Once  . vitamin C  500 mg Per Tube TID  . aspirin  324 mg Per Tube Daily  . atorvastatin  80 mg Per Tube Daily  . busPIRone  10 mg Per Tube TID  . chlorhexidine gluconate (MEDLINE KIT)  15  mL Mouth Rinse BID  . Chlorhexidine Gluconate Cloth  6 each Topical Daily  . darbepoetin (ARANESP) injection - NON-DIALYSIS  60 mcg Subcutaneous Q Sat-1800  . feeding supplement (PROSource TF)  45 mL Per Tube TID  . fiber  1 packet Per Tube BID  . FLUoxetine  40 mg Per Tube BID  . insulin aspart  0-15 Units Subcutaneous Q4H  . insulin detemir  10 Units Subcutaneous BID  . lidocaine  1 patch Transdermal Q24H  . mouth rinse  15 mL Mouth Rinse 10 times per day  . melatonin  5 mg Per Tube Daily  . midodrine  15 mg Per Tube TID WC  . multivitamin  1 tablet Per Tube QHS  . oxyCODONE  5 mg Per Tube Q8H  . pantoprazole sodium  40 mg Per Tube Daily  . QUEtiapine  50 mg Per Tube BID  . sodium chloride flush  10-40 mL Intracatheter Q12H  . sodium chloride flush  3 mL Intravenous Q12H  . thiamine  100 mg Per Tube Daily  . valproic acid  250 mg Per Tube BID    have reviewed scheduled and prn medications.  Physical Exam:  General: Critically ill male, intubated, sedated, multiple IV lines and pressors. Heart:normal rate, no audible murmur, wound vac in place on chest Lungs ventilated, bilateral chest rise, Coarse breath sound. Abdomen:soft, non-distended Extremities: trace lower extremity edema, warm Dialysis Access: Right IJ HD cath, temp  Shaune Pollack  Daxtyn Rottenberg 01/30/2020,8:07 AM  LOS: 31 days

## 2020-01-30 NOTE — Procedures (Signed)
Patient was seen and evaluated on CRRT. Continue UF as tolerated. No changes today. Details per progress note.

## 2020-01-30 NOTE — Progress Notes (Signed)
[address]       [place] [address]             [phone]                 5 Days Post-Op Procedure(s) (LRB): STERNAL WASH OUT (N/A) APPLICATION OF WOUND VAC (N/A)   Events: No events Able to pull more fluid after albumin given Down on pressors _______________________________________________________________ Vitals: BP (!) 102/57   Pulse 84   Temp (!) 96 F (35.6 C)   Resp (!) 28   Ht 6\' 3"  (1.905 m)   Wt 103.7 kg   SpO2 100%   BMI 28.58 kg/m   - Neuro: sedated  - Cardiovascular: sinus  Impella: p8 flow of 4.4.    Drips: amio 30, epi 5, milr 0.125, levo 22  iNO 1 ppm.   CVP:  [13 mmHg-20 mmHg] 20 mmHg  - Pulm:  Vent Mode: PRVC FiO2 (%):  [40 %] 40 % Set Rate:  [28 bmp] 28 bmp Vt Set:  [510 mL] 510 mL PEEP:  [5 cmH20] 5 cmH20 Plateau Pressure:  [24 cmH20-26 cmH20] 24 cmH20  ABG    Component Value Date/Time   PHART 7.291 (L) 01/30/2020 0530   PCO2ART 57.6 (H) 01/30/2020 0530   PO2ART 156 (H) 01/30/2020 0530   HCO3 27.7 01/30/2020 0530   TCO2 29 01/30/2020 0530   ACIDBASEDEF 1.0 01/30/2020 0530   O2SAT 99.0 01/30/2020 0530    - Abd: soft   .Intake/Output      01/01 0701 01/02 0700 01/02 0701 01/03 0700   P.O. 0 0   I.V. (mL/kg) 2723.9 (26.3) 209.2 (2)   Other 215.9 18.3   NG/GT 2070 140   IV Piggyback 200    Total Intake(mL/kg) 5209.8 (50.2) 367.5 (3.5)   Urine (mL/kg/hr) 500 (0.2) 0 (0)   Emesis/NG output 0 0   Drains 175 25   Other 6290 552   Stool 400 0   Blood 0 0   Chest Tube 40 0   Total Output 7405 577   Net -2195.2 -209.5        Urine Occurrence 0 x 0 x   Stool Occurrence 0 x 0 x   Emesis Occurrence 0 x 0 x      _______________________________________________________________ Labs: CBC Latest Ref Rng & Units 01/30/2020 01/30/2020 01/29/2020  WBC 4.0 - 10.5 K/uL - 13.3(H) -  Hemoglobin 13.0 - 17.0 g/dL 20.1(H) 7.5(L) 9.5(L)  Hematocrit 39.0 - 52.0 % 59.0(H) 25.2(L) 28.0(L)  Platelets 150 - 400 K/uL -  339 -   CMP Latest Ref Rng & Units 01/30/2020 01/30/2020 01/29/2020  Glucose 70 - 99 mg/dL - 270(H) -  BUN 6 - 20 mg/dL - 38(H) -  Creatinine 0.61 - 1.24 mg/dL - 1.58(H) -  Sodium 135 - 145 mmol/L 135 134(L) 135  Potassium 3.5 - 5.1 mmol/L 4.4 4.5 4.4  Chloride 98 - 111 mmol/L - 102 -  CO2 22 - 32 mmol/L - 25 -  Calcium 8.9 - 10.3 mg/dL - 8.1(L) -  Total Protein 6.5 - 8.1 g/dL - - -  Total Bilirubin 0.3 - 1.2 mg/dL - - -  Alkaline Phos 38 - 126 U/L - - -  AST 15 - 41 U/L - - -  ALT 0 - 44 U/L - - -    CXR: pending  _______________________________________________________________  Assessment and Plan:  s/p Redosternotomy CABG on 12/15.  S/p re-exploration, closure, and re-exploration.  Currently has open chest  Neuro: sedated for open chest CV: consider adding vaso given high dose levo.  Continue impella support. Wean iNO if poossible Pulm: continue vent support Renal: on CRRT, pulling fluid GI: on tube feeds.  Watch for bowel distension given pressor support Heme: stable ID: on abx for open chest Endo: on insulin Dispo: continue ICU care   Lajuana Matte 01/30/2020 9:21 AM

## 2020-01-30 NOTE — Progress Notes (Signed)
Patient ID: Corey Palmer, male   DOB: Nov 08, 1970, 50 y.o.   MRN: [id number]     Advanced Heart Failure Rounding Note  PCP-Cardiologist: No primary care provider on file.   Subjective:    - 12/7 Torsades -ICD shock x1.  - 12/11 Impella 5.5 placed.  - 12/12 Impella repositioned in OR - 12/15 CABG x 4 with LIMA-LAD, SVG-PDA/PLV, SVG-OM - 12/16 DCCV afib.  Back to OR to re-open chest and again to evacuate hematoma.  - 12/19 CVVH initiated  - 12/23 Chest closed - 12/24 Chest reopened - 12/26 RAP back into NSR - 12/27 back in atrial fibrillation, unable to rapid atrial pace out.   - 12/28 To OR, chest partially closed and wound vac placed.  DCCV to NSR.  - 12/31 Antibiotics stopped. Swan removed, HD catheter moved from right femoral to right IJ.   On NE 22  Epi 5, milrinone 0.125 and Impella at P8.     CVP 5 with co-ox 70%.  CVVH yesterday with 50-100 cc/hr net negative.  Weight down.  Made 500 cc urine.   NSR in 80s.  On amiodarone gtt 30 mg/hr.   Impella 5.5 P-8  Flow 4.5 Heparin systemically Waveforms ok. No alarms LDH 357 => 349 => 333=>377=>437=>556=>457  Echo (12/10): EF <20%, moderate LV dilation, normal RV size with mildly decreased systolic function.    Objective:   Weight Range: 103.7 kg Body mass index is 28.58 kg/m.   Vital Signs:   Temp:  [93.7 F (34.3 C)-98.6 F (37 C)] 96 F (35.6 C) (01/02 0719) Pulse Rate:  [79-103] 84 (01/02 0719) Resp:  [16-29] 28 (01/02 0719) BP: (98-115)/(57-69) 102/57 (01/02 0719) SpO2:  [100 %] 100 % (01/02 0719) FiO2 (%):  [40 %] 40 % (01/02 0719) Weight:  [103.7 kg] 103.7 kg (01/02 0434) Last BM Date: 01/29/20  Weight change: Filed Weights   01/28/20 0630 01/29/20 0615 01/30/20 0434  Weight: 106.9 kg 106 kg 103.7 kg    Intake/Output:   Intake/Output Summary (Last 24 hours) at 01/30/2020 0937 Last data filed at 01/30/2020 0900 Gross per 24 hour  Intake 5168.75 ml  Output 7587 ml  Net -2418.25 ml      Physical  Exam  General: NAD Neck: No JVD, no thyromegaly or thyroid nodule.  Lungs: Decreased at bases.  CV: Chest wall open.  Heart regular S1/S2, no S3/S4, no murmur.  1+ ankle edema. Abdomen: Soft, nontender, no hepatosplenomegaly, no distention.  Skin: Intact without lesions or rashes.  Neurologic: will wake up and follow commands.  Extremities: No clubbing or cyanosis.  HEENT: Normal.    Telemetry  NSR 80s (personally reviewed)    Labs    CBC Recent Labs    01/29/20 0354 01/29/20 0435 01/30/20 0500 01/30/20 0530  WBC 15.1*  --  13.3*  --   NEUTROABS 11.2*  --  PENDING  --   HGB 8.0*   < > 7.5* 20.1*  HCT 26.9*   < > 25.2* 59.0*  MCV 93.7  --  95.5  --   PLT 305  --  339  --    < > = values in this interval not displayed.   Basic Metabolic Panel Recent Labs    01/29/20 0354 01/29/20 0435 01/29/20 1546 01/29/20 1550 01/30/20 0500 01/30/20 0530  NA 133*   < > 135   < > 134* 135  K 4.9   < > 4.6   < > 4.5 4.4  CL 100  --  102  --  102  --   CO2 24  --  25  --  25  --   GLUCOSE 304*  --  273*  --  270*  --   BUN 37*  --  37*  --  38*  --   CREATININE 1.65*  --  1.53*  --  1.58*  --   CALCIUM 7.8*  --  8.0*  --  8.1*  --   MG 2.2  --   --   --  2.2  --   PHOS 2.7  --  2.5  --  2.6  --    < > = values in this interval not displayed.   Liver Function Tests Recent Labs    01/29/20 1546 01/30/20 0500  ALBUMIN 2.3* 2.2*   No results for input(s): LIPASE, AMYLASE in the last 72 hours. Cardiac Enzymes No results for input(s): CKTOTAL, CKMB, CKMBINDEX, TROPONINI in the last 72 hours.  BNP: BNP (last 3 results) Recent Labs    01/24/2020 1619  BNP 577.5*    ProBNP (last 3 results) No results for input(s): PROBNP in the last 8760 hours.   D-Dimer No results for input(s): DDIMER in the last 72 hours. Hemoglobin A1C No results for input(s): HGBA1C in the last 72 hours. Fasting Lipid Panel No results for input(s): CHOL, HDL, LDLCALC, TRIG, CHOLHDL, LDLDIRECT in  the last 72 hours. Thyroid Function Tests No results for input(s): TSH, T4TOTAL, T3FREE, THYROIDAB in the last 72 hours.  Invalid input(s): FREET3  Other results:   Imaging    No results found.   Medications:     Scheduled Medications: . sodium chloride   Intravenous Once  . vitamin C  500 mg Per Tube TID  . aspirin  324 mg Per Tube Daily  . atorvastatin  80 mg Per Tube Daily  . busPIRone  10 mg Per Tube TID  . chlorhexidine gluconate (MEDLINE KIT)  15 mL Mouth Rinse BID  . Chlorhexidine Gluconate Cloth  6 each Topical Daily  . [START ON 02/05/2020] darbepoetin (ARANESP) injection - NON-DIALYSIS  100 mcg Subcutaneous Q Sat-1800  . feeding supplement (PROSource TF)  45 mL Per Tube TID  . fiber  1 packet Per Tube BID  . FLUoxetine  40 mg Per Tube BID  . insulin aspart  0-15 Units Subcutaneous Q4H  . insulin detemir  10 Units Subcutaneous BID  . lidocaine  1 patch Transdermal Q24H  . mouth rinse  15 mL Mouth Rinse 10 times per day  . melatonin  5 mg Per Tube Daily  . midodrine  15 mg Per Tube TID WC  . multivitamin  1 tablet Per Tube QHS  . oxyCODONE  5 mg Per Tube Q8H  . pantoprazole sodium  40 mg Per Tube Daily  . QUEtiapine  50 mg Per Tube BID  . sodium chloride flush  10-40 mL Intracatheter Q12H  . sodium chloride flush  3 mL Intravenous Q12H  . thiamine  100 mg Per Tube Daily  . valproic acid  250 mg Per Tube BID    Infusions: .  prismasol BGK 4/2.5 500 mL/hr at 01/29/20 2100  .  prismasol BGK 4/2.5 200 mL/hr at 01/30/20 0200  . sodium chloride Stopped (01/03/2020 1646)  . sodium chloride 10 mL/hr at 01/26/2020 1302  . sodium chloride Stopped (01/16/20 1032)  . sodium chloride Stopped (01/29/20 0955)  . amiodarone 30 mg/hr (01/30/20 0900)  . epinephrine 5 mcg/min (01/30/20 0917)  .  feeding supplement (PIVOT 1.5 CAL) 1,000 mL (01/29/20 2333)  . fentaNYL infusion INTRAVENOUS 150 mcg/hr (01/30/20 0900)  . heparin 950 Units/hr (01/30/20 0900)  . impella catheter  heparin 50 unit/mL in dextrose 5% 9 mL/hr at 01/21/20 0015  . lactated ringers 20 mL/hr at 01/30/20 0900  . norepinephrine (LEVOPHED) Adult infusion 22 mcg/min (01/30/20 0919)  . prismasol BGK 4/2.5 1,500 mL/hr at 01/30/20 0400  . vasopressin Stopped (01/28/20 1136)    PRN Medications: sodium chloride, sodium chloride, sodium chloride, artificial tears, bisacodyl **OR** bisacodyl, dextrose, docusate, fentaNYL, fentaNYL (SUBLIMAZE) injection, fentaNYL (SUBLIMAZE) injection, heparin, heparin, metoprolol tartrate, midazolam, ondansetron (ZOFRAN) IV, polyethylene glycol, sodium chloride flush, sodium chloride flush     Assessment/Plan   1. Cardiogenic shock/acute on chronic systolic CHF: Initially nonischemic cardiomyopathy. Possible familial cardiomyopathy as both parents had cardiomyopathy and died at around 4.However, [employer] gene testing did not show any common mutation for cardiomyopathy.  However, this admission noted to have severe 3 vessel disease so suspect component of ischemic cardiomyopathy. [employer] ICD. Echo in 8/20 with EF 15% and mildly decreased RV function. Echo this admission with EF < 20%, moderate LV dilation, RV mildly reduced, severe LAE, no significant MR.Low output HF with markedly low EF.  He has a long history of cardiomyopathy (20 yrs), tends to minimize symptoms.  Cardiorenal syndrome with creatinine up to 2.2,  stabilized on milrinone and Impella 5.5. SCr 1.44 day of CABG. CABG 12/15.  Post-op shock, back to OR 12/16 to open chest (chest wall compartment syndrome) and again to evacuate hematoma.  TEE post-op with severe RV dysfunction.  CVVHD initiated 12/19 for volume removal in setting of rising Scr/decreased UOP. Suspect ATN. S/p chest closure 12/23. Hemodynamics much worse on 12/24 with rising pressor demands. Co-ox 36%.  Bedside echo severe biventricular dysfunction with marked septal bounce. Chest re-opened 12/24.  Was atrial paced back to NSR on 12/26 with  improvement in hemodynamics but back in atrial fibrillation on 12/27 and unable to pace out, DCCV to NSR on 12/28. Remains on pressors (NE 22, Epi 5, milrinone 0.125). Now off vasopressin.  CVP down to 5, co-ox 70%.   - Run CVVH even today.  - Hopefully close chest in the next couple days.     - Continue Impella to P8 to facilitate weaning of vasoactive meds.  LDH, platelets stable. On heparin gtt. - Wean NE and epinephrine down today.  Slow process with RV dysfunction.    - Continue midodrine 15 mg tid to try to wean pressors.  2. Atrial fibrillation: H/o PAF.  He was on dofetilide in the remote past but this was stopped due to noncompliance. He had an upper GI bleed from antral ulcers in 3/12. He was seen by GI and was deemed safe to restart anticoagulation as long as he remains on a PPI.  No apparent recurrence of AF until just prior to this admission, was cardioverted back to NSR in ER but went back into atrial fibrillation.  Post-op afib, DCCV to NSR/BiV pacing am 12/16 -> back to AF. Rapid atrial pacing back to SR on 12/26 -> back in AF on 12/27 and unable to pace out. 12/28 DCCV to NSR.  NSR today. - Continue IV amiodarone 30 mg/hr while on pressors.  - He is now back on heparin gtt.  - Did not have Maze with CABG.    3. CAD:   Cath this admission with severe 3 vessel disease. CABG x 4 on 12/15.  - Continue  atorvastatin, ASA.  - continue current management 4. Acute on chronic hypoxemic respiratory failure: Vent per CCM.  - CCM following - Will need tracheostomy when chest fully closed.  5. AKI on CKD stage 3: Likely ATN/cardiorenal.  On CVVH.  CVP 5.  Had 500 cc urine yesterday.    - Run CVVH even today.    6. Diabetes: Insulin.  7. Hyponatremia: Resolved.  8. Torsades/ICD shock: 12/7/212 in hospital event, in setting of severe hypokalemia and hypomagnesemia.   9. Gout: h/o gout. Complained of rt knee pain c/w previous flares - treated w/ prednisone burst (completed).  10: ID:  Treating for PNA and also with chest wall open.   - off antibiotics now per CCM/CT surgery.  11. FEN: TFs ongoing.   12. Anemia: Hgb 7.5, will give 1 unit PRBCs (transfuse hgb < 8).    CRITICAL CARE Performed by: Loralie Champagne  Total critical care time: 40 minutes  Critical care time was exclusive of separately billable procedures and treating other patients.  Critical care was necessary to treat or prevent imminent or life-threatening deterioration.  Critical care was time spent personally by me on the following activities: development of treatment plan with patient and/or surrogate as well as nursing, discussions with consultants, evaluation of patient's response to treatment, examination of patient, obtaining history from patient or surrogate, ordering and performing treatments and interventions, ordering and review of laboratory studies, ordering and review of radiographic studies, pulse oximetry and re-evaluation of patient's condition.  Loralie Champagne 01/30/2020 9:37 AM

## 2020-01-31 ENCOUNTER — Inpatient Hospital Stay (HOSPITAL_COMMUNITY): Payer: BC Managed Care – PPO

## 2020-01-31 DIAGNOSIS — J9601 Acute respiratory failure with hypoxia: Secondary | ICD-10-CM | POA: Diagnosis not present

## 2020-01-31 DIAGNOSIS — Z9911 Dependence on respirator [ventilator] status: Secondary | ICD-10-CM | POA: Diagnosis not present

## 2020-01-31 DIAGNOSIS — I4891 Unspecified atrial fibrillation: Secondary | ICD-10-CM | POA: Diagnosis not present

## 2020-01-31 DIAGNOSIS — L8990 Pressure ulcer of unspecified site, unspecified stage: Secondary | ICD-10-CM | POA: Insufficient documentation

## 2020-01-31 DIAGNOSIS — R570 Cardiogenic shock: Secondary | ICD-10-CM | POA: Diagnosis not present

## 2020-01-31 LAB — POCT I-STAT 7, (LYTES, BLD GAS, ICA,H+H)
Acid-Base Excess: 0 mmol/L (ref 0.0–2.0)
Bicarbonate: 26.3 mmol/L (ref 20.0–28.0)
Calcium, Ion: 1.23 mmol/L (ref 1.15–1.40)
HCT: 33 % — ABNORMAL LOW (ref 39.0–52.0)
Hemoglobin: 11.2 g/dL — ABNORMAL LOW (ref 13.0–17.0)
O2 Saturation: 99 %
Patient temperature: 98.5
Potassium: 4.7 mmol/L (ref 3.5–5.1)
Sodium: 135 mmol/L (ref 135–145)
TCO2: 28 mmol/L (ref 22–32)
pCO2 arterial: 49.2 mmHg — ABNORMAL HIGH (ref 32.0–48.0)
pH, Arterial: 7.335 — ABNORMAL LOW (ref 7.350–7.450)
pO2, Arterial: 125 mmHg — ABNORMAL HIGH (ref 83.0–108.0)

## 2020-01-31 LAB — RENAL FUNCTION PANEL
Albumin: 1.9 g/dL — ABNORMAL LOW (ref 3.5–5.0)
Albumin: 1.9 g/dL — ABNORMAL LOW (ref 3.5–5.0)
Anion gap: 9 (ref 5–15)
Anion gap: 8 (ref 5–15)
BUN: 39 mg/dL — ABNORMAL HIGH (ref 6–20)
BUN: 43 mg/dL — ABNORMAL HIGH (ref 6–20)
CO2: 24 mmol/L (ref 22–32)
CO2: 24 mmol/L (ref 22–32)
Calcium: 8.2 mg/dL — ABNORMAL LOW (ref 8.9–10.3)
Calcium: 7.8 mg/dL — ABNORMAL LOW (ref 8.9–10.3)
Chloride: 100 mmol/L (ref 98–111)
Chloride: 98 mmol/L (ref 98–111)
Creatinine, Ser: 1.59 mg/dL — ABNORMAL HIGH (ref 0.61–1.24)
Creatinine, Ser: 1.59 mg/dL — ABNORMAL HIGH (ref 0.61–1.24)
GFR, Estimated: 52 mL/min — ABNORMAL LOW (ref >60)
GFR, Estimated: 52 mL/min — ABNORMAL LOW (ref >60)
Glucose, Bld: 279 mg/dL — ABNORMAL HIGH (ref 70–99)
Glucose, Bld: 289 mg/dL — ABNORMAL HIGH (ref 70–99)
Phosphorus: 2.5 mg/dL (ref 2.5–4.6)
Phosphorus: 2.5 mg/dL (ref 2.5–4.6)
Potassium: 4.7 mmol/L (ref 3.5–5.1)
Potassium: 4.9 mmol/L (ref 3.5–5.1)
Sodium: 133 mmol/L — ABNORMAL LOW (ref 135–145)
Sodium: 130 mmol/L — ABNORMAL LOW (ref 135–145)

## 2020-01-31 LAB — CBC WITH DIFFERENTIAL/PLATELET
Abs Immature Granulocytes: 0.23 10*3/uL — ABNORMAL HIGH (ref 0.00–0.07)
Basophils Absolute: 0.1 10*3/uL (ref 0.0–0.1)
Basophils Relative: 1 %
Eosinophils Absolute: 0.5 10*3/uL (ref 0.0–0.5)
Eosinophils Relative: 4 %
HCT: 25.6 % — ABNORMAL LOW (ref 39.0–52.0)
Hemoglobin: 8.1 g/dL — ABNORMAL LOW (ref 13.0–17.0)
Immature Granulocytes: 2 %
Lymphocytes Relative: 8 %
Lymphs Abs: 0.9 10*3/uL (ref 0.7–4.0)
MCH: 29.1 pg (ref 26.0–34.0)
MCHC: 31.6 g/dL (ref 30.0–36.0)
MCV: 92.1 fL (ref 80.0–100.0)
Monocytes Absolute: 1.2 10*3/uL — ABNORMAL HIGH (ref 0.1–1.0)
Monocytes Relative: 10 %
Neutro Abs: 9.6 10*3/uL — ABNORMAL HIGH (ref 1.7–7.7)
Neutrophils Relative %: 75 %
Platelets: 322 10*3/uL (ref 150–400)
RBC: 2.78 MIL/uL — ABNORMAL LOW (ref 4.22–5.81)
RDW: 22 % — ABNORMAL HIGH (ref 11.5–15.5)
WBC: 12.5 10*3/uL — ABNORMAL HIGH (ref 4.0–10.5)
nRBC: 0.2 % (ref 0.0–0.2)

## 2020-01-31 LAB — LACTATE DEHYDROGENASE: LDH: 422 U/L — ABNORMAL HIGH (ref 98–192)

## 2020-01-31 LAB — GLUCOSE, CAPILLARY
Glucose-Capillary: 166 mg/dL — ABNORMAL HIGH (ref 70–99)
Glucose-Capillary: 176 mg/dL — ABNORMAL HIGH (ref 70–99)
Glucose-Capillary: 180 mg/dL — ABNORMAL HIGH (ref 70–99)
Glucose-Capillary: 220 mg/dL — ABNORMAL HIGH (ref 70–99)
Glucose-Capillary: 266 mg/dL — ABNORMAL HIGH (ref 70–99)
Glucose-Capillary: 268 mg/dL — ABNORMAL HIGH (ref 70–99)

## 2020-01-31 LAB — COOXEMETRY PANEL
Carboxyhemoglobin: 2 % — ABNORMAL HIGH (ref 0.5–1.5)
Methemoglobin: 0.4 % (ref 0.0–1.5)
O2 Saturation: 74.2 %
Total hemoglobin: 7.6 g/dL — ABNORMAL LOW (ref 12.0–16.0)

## 2020-01-31 LAB — POCT ACTIVATED CLOTTING TIME: Activated Clotting Time: 969 seconds

## 2020-01-31 LAB — MAGNESIUM: Magnesium: 2.4 mg/dL (ref 1.7–2.4)

## 2020-01-31 LAB — HEPARIN LEVEL (UNFRACTIONATED): Heparin Unfractionated: <0.1 IU/mL — ABNORMAL LOW (ref 0.30–0.70)

## 2020-01-31 MED ORDER — INSULIN ASPART 100 UNIT/ML ~~LOC~~ SOLN
0.0000 [IU] | SUBCUTANEOUS | Status: DC
  Administered 2020-01-31: 11 [IU] via SUBCUTANEOUS
  Administered 2020-01-31 (×3): 4 [IU] via SUBCUTANEOUS

## 2020-01-31 MED ORDER — INSULIN DETEMIR 100 UNIT/ML ~~LOC~~ SOLN
15.0000 [IU] | Freq: Two times a day (BID) | SUBCUTANEOUS | Status: DC
  Administered 2020-01-31 (×2): 15 [IU] via SUBCUTANEOUS
  Filled 2020-01-31 (×3): qty 0.15

## 2020-01-31 NOTE — Progress Notes (Signed)
[facility] Progress Note    Assessment/ Plan:   #AKI/CKD stage III- presumably due to ischemic ATN in setting of cardiogenic shock s/p CABG.  CRRT started 01/16/20. All fluids 4K/2.5Ca: Pre-filter 500 ml/hr, post filter 200 ml/hr, dialysate 1500 ml/hr. Right femoral HD catheter placed 01/16/20 switched to [employer] on 01/28/20.  - continue CRRT--> keep even; hoping to start gentle UF sometime this week- will keep a close eye on hemodynamics   # CAD s/p CABG complicated by cardiogenic shock and post-op hematoma. secondary closure on 12/23 then had to be re opened.  Went to the OR for washout on 12/28.  Hopeful for closure sometime this week  # Cardiogenic shock- underlying familial cardiomyopathy s/p redo CABG. Remains on pressors: Levophed and epi. Impella and milrinone. Continue mgmt per primary  # ABLA- s/p postoperative bleeding into chest cavity s/p re-exploration for tamponade/hematoma. CT surgery following.stable, on aranesp 39mg weekly started 12/25. Inc to 1022m and transfuse as needed; hgb 7.5 today   #Hypophosphatemia- due to CRRT. replete prn  # Atrial fibrillation - s/p DCCV on 12/16.  On amiodarone. Has been in NSR for about a week  # VDRF- per primary svc.  #-Leukocytosis.stopped antibiotics, CTM  # Hypoalbuminemia: albumin improved s/p 50g Redose as needed  Subjective:    Seen in room.  Plan for bedside woundvac change.  CVPs 3-5 this AM.     Objective:   BP (!) 91/53   Pulse 77   Temp 98.4 F (36.9 C) (Axillary)   Resp (!) 28   Ht _0  (1.905 m)   Wt 102.3 kg   SpO2 100%   BMI 28.19 kg/m   Intake/Output Summary (Last 24 hours) at 01/31/2020 1021 Last data filed at 01/31/2020 0900 Gross per 24 hour  Intake 4441.19 ml  Output 4559 ml  Net -117.81 ml   Weight change: -1.4 kg  Physical Exam: Gen: intermittently drifting off to sleep CVS: Impella heard CHEST: woundvac in place, 2 chest tubes in place Resp: mech breath  sounds Abd: mildly distended Ext: some mild dependent edema ACCESS: R IJ nontunneled HD cath  Imaging: DG Abd 1 View  Result Date: 01/31/2020 CLINICAL DATA:  Abdominal pain. EXAM: ABDOMEN - 1 VIEW COMPARISON:  01/24/2020 FINDINGS: 0621 hours. Feeding tube tip is in the distal stomach near the pylorus. Diffuse gaseous distension of right and transverse colon with scattered small bowel gas evident. IMPRESSION: Feeding tube tip is in the distal stomach near the pylorus. No radiographic features of overt small bowel obstruction although gaseous distention of colon and scattered small bowel may reflect component of ileus. Electronically Signed   By: ErMisty Stanley.D.   On: 01/31/2020 07:12   DG CHEST PORT 1 VIEW  Result Date: 01/31/2020 CLINICAL DATA:  Respiratory failure EXAM: PORTABLE CHEST 1 VIEW COMPARISON:  01/30/2020 FINDINGS: 0616 hours. The cardio pericardial silhouette is enlarged. Bilateral chest tubes evident without appreciable pneumothorax. Right IJ catheter tip overlies the mid to distal SVC. Right PICC line remains in place with tip obscured. Impella device again noted. Vascular congestion with patchy basilar atelectasis or infiltrate. Telemetry leads overlie the chest. IMPRESSION: Stable exam. Cardiomegaly with patchy bilateral basilar predominant airspace disease. Electronically Signed   By: ErMisty Stanley.D.   On: 01/31/2020 06:57   DG CHEST PORT 1 VIEW  Result Date: 01/30/2020 CLINICAL DATA:  Respiratory failure EXAM: PORTABLE CHEST 1 VIEW COMPARISON:  01/28/2020 FINDINGS: Support devices remain in place, unchanged. Bilateral chest tubes. No pneumothorax. Cardiomegaly  with vascular congestion. Bibasilar opacities, likely atelectasis. IMPRESSION: Support devices stable.  No pneumothorax. Cardiomegaly, vascular congestion and bibasilar atelectasis. Electronically Signed   By: Rolm Baptise M.D.   On: 01/30/2020 14:15    Labs: BMET Recent Labs  Lab 01/28/20 0258 01/28/20 0447  01/28/20 1719 01/29/20 0354 01/29/20 0435 01/29/20 1546 01/29/20 1550 01/30/20 0500 01/30/20 0530 01/30/20 1402 01/30/20 1555 01/31/20 0405 01/31/20 0453  NA 134*   < > 132* 133*   < > 135 135 134* 135 135 134* 133* 135  K 4.8   < > 4.3 4.9   < > 4.6 4.4 4.5 4.4 4.8 4.7 4.7 4.7  CL 100  --  101 100  --  102  --  102  --   --  102 100  --   CO2 25  --  23 24  --  25  --  25  --   --  25 24  --   GLUCOSE 222*  --  263* 304*  --  273*  --  270*  --   --  284* 279*  --   BUN 30*  --  35* 37*  --  37*  --  38*  --   --  40* 39*  --   CREATININE 1.70*  --  1.61* 1.65*  --  1.53*  --  1.58*  --   --  1.50* 1.59*  --   CALCIUM 8.0*  --  7.7* 7.8*  --  8.0*  --  8.1*  --   --  8.1* 8.2*  --   PHOS 3.1  --  2.6 2.7  --  2.5  --  2.6  --   --  2.7 2.5  --    < > = values in this interval not displayed.   CBC Recent Labs  Lab 01/28/20 0258 01/28/20 0447 01/29/20 0354 01/29/20 0435 01/30/20 0500 01/30/20 0530 01/30/20 1350 01/30/20 1402 01/31/20 0405 01/31/20 0453  WBC 16.8*  --  15.1*  --  13.3*  --   --   --  12.5*  --   NEUTROABS 13.1*  --  11.2*  --  9.7*  --   --   --  9.6*  --   HGB 8.9*   < > 8.0*   < > 7.5*   < > 7.9* 8.5* 8.1* 11.2*  HCT 30.2*   < > 26.9*   < > 25.2*   < > 26.6* 25.0* 25.6* 33.0*  MCV 94.7  --  93.7  --  95.5  --   --   --  92.1  --   PLT 282  --  305  --  339  --   --   --  322  --    < > = values in this interval not displayed.    Medications:    . sodium chloride   Intravenous Once  . vitamin C  500 mg Per Tube TID  . aspirin  324 mg Per Tube Daily  . atorvastatin  80 mg Per Tube Daily  . busPIRone  10 mg Per Tube TID  . chlorhexidine gluconate (MEDLINE KIT)  15 mL Mouth Rinse BID  . Chlorhexidine Gluconate Cloth  6 each Topical Daily  . [START ON 02/05/2020] darbepoetin (ARANESP) injection - NON-DIALYSIS  100 mcg Subcutaneous Q Sat-1800  . feeding supplement (PROSource TF)  45 mL Per Tube TID  . fiber  1 packet Per Tube BID  .  FLUoxetine  40 mg  Per Tube BID  . insulin aspart  0-20 Units Subcutaneous Q4H  . insulin detemir  15 Units Subcutaneous BID  . lidocaine  1 patch Transdermal Q24H  . mouth rinse  15 mL Mouth Rinse 10 times per day  . melatonin  5 mg Per Tube Daily  . midodrine  15 mg Per Tube TID WC  . multivitamin  1 tablet Per Tube QHS  . oxyCODONE  10 mg Per Tube Q8H  . pantoprazole sodium  40 mg Per Tube Daily  . QUEtiapine  50 mg Per Tube BID  . sodium chloride flush  10-40 mL Intracatheter Q12H  . sodium chloride flush  3 mL Intravenous Q12H  . thiamine  100 mg Per Tube Daily  . valproic acid  250 mg Per Tube BID      Madelon Lips, MD 01/31/2020, 10:21 AM

## 2020-01-31 NOTE — Progress Notes (Addendum)
Patient ID: Corey Palmer, male   DOB: [birth year], 50 y.o.   MRN: [id number]     Advanced Heart Failure Rounding Note  PCP-Cardiologist: No primary care provider on file.   Subjective:    - 12/7 Torsades -ICD shock x1.  - 12/11 Impella 5.5 placed.  - 12/12 Impella repositioned in OR - 12/15 CABG x 4 with LIMA-LAD, SVG-PDA/PLV, SVG-OM - 12/16 DCCV afib.  Back to OR to re-open chest and again to evacuate hematoma.  - 12/19 CVVH initiated  - 12/23 Chest closed - 12/24 Chest reopened - 12/26 RAP back into NSR - 12/27 back in atrial fibrillation, unable to rapid atrial pace out.   - 12/28 To OR, chest partially closed and wound vac placed.  DCCV to NSR.  - 12/31 Antibiotics stopped. Swan removed, HD catheter moved from right femoral to right IJ.   On NE 10 ,  Epi 5,Impella at P8.     CO-OX 74%.   CVVH running even.     Had some A fib but this morning in NSR in 80s.  On amiodarone gtt 30 mg/hr.   Awake on vent.   Impella 5.5 P-8  Flow 4.4 Heparin systemically Waveforms ok. No alarms LDH 422  Echo (12/10): EF <20%, moderate LV dilation, normal RV size with mildly decreased systolic function.    Objective:   Weight Range: 102.3 kg Body mass index is 28.19 kg/m.   Vital Signs:   Temp:  [96 F (35.6 C)-98.7 F (37.1 C)] 98.5 F (36.9 C) (01/03 0400) Pulse Rate:  [73-88] 79 (01/03 0600) Resp:  [22-28] 28 (01/03 0600) BP: (91-102)/(53-57) 91/53 (01/02 1515) SpO2:  [100 %] 100 % (01/03 0600) FiO2 (%):  [40 %] 40 % (01/03 0400) Weight:  [102.3 kg] 102.3 kg (01/03 0448) Last BM Date: 01/30/20  Weight change: Filed Weights   01/29/20 0615 01/30/20 0434 01/31/20 0448  Weight: 106 kg 103.7 kg 102.3 kg    Intake/Output:   Intake/Output Summary (Last 24 hours) at 01/31/2020 0704 Last data filed at 01/31/2020 0600 Gross per 24 hour  Intake 4672.61 ml  Output 4814 ml  Net -141.39 ml      Physical Exam  CVP 5  General:  Intubated.  HEENT: ETT. [place]rak Neck:  supple. no JVD. Carotids 2+ bilat; no bruits. No lymphadenopathy or thryomegaly appreciated. Cor: Chest open with VAC in place. PMI nondisplaced. Regular rate & rhythm. No rubs, gallops or murmurs. Lungs: Coarse throughout.  Abdomen: soft, nontender, nondistended. No hepatosplenomegaly. No bruits or masses. Good bowel sounds.  Extremities: no cyanosis, clubbing, rash, edema Neuro: Intubated. Awake follows commands. MAE x4.    Telemetry  NSR 80s had some episodes of A Fib.    Labs    CBC Recent Labs    01/30/20 0500 01/30/20 0530 01/31/20 0405 01/31/20 0453  WBC 13.3*  --  12.5*  --   NEUTROABS 9.7*  --  9.6*  --   HGB 7.5*   < > 8.1* 11.2*  HCT 25.2*   < > 25.6* 33.0*  MCV 95.5  --  92.1  --   PLT 339  --  322  --    < > = values in this interval not displayed.   Basic Metabolic Panel Recent Labs    01/30/20 0500 01/30/20 0530 01/30/20 1555 01/31/20 0405 01/31/20 0453  NA 134*   < > 134* 133* 135  K 4.5   < > 4.7 4.7 4.7  CL 102  --  102 100  --   CO2 25  --  25 24  --   GLUCOSE 270*  --  284* 279*  --   BUN 38*  --  40* 39*  --   CREATININE 1.58*  --  1.50* 1.59*  --   CALCIUM 8.1*  --  8.1* 8.2*  --   MG 2.2  --   --  2.4  --   PHOS 2.6  --  2.7 2.5  --    < > = values in this interval not displayed.   Liver Function Tests Recent Labs    01/30/20 1555 01/31/20 0405  ALBUMIN 2.1* 1.9*   No results for input(s): LIPASE, AMYLASE in the last 72 hours. Cardiac Enzymes No results for input(s): CKTOTAL, CKMB, CKMBINDEX, TROPONINI in the last 72 hours.  BNP: BNP (last 3 results) Recent Labs    01/24/2020 1619  BNP 577.5*    ProBNP (last 3 results) No results for input(s): PROBNP in the last 8760 hours.   D-Dimer No results for input(s): DDIMER in the last 72 hours. Hemoglobin A1C No results for input(s): HGBA1C in the last 72 hours. Fasting Lipid Panel No results for input(s): CHOL, HDL, LDLCALC, TRIG, CHOLHDL, LDLDIRECT in the last 72  hours. Thyroid Function Tests No results for input(s): TSH, T4TOTAL, T3FREE, THYROIDAB in the last 72 hours.  Invalid input(s): FREET3  Other results:   Imaging    DG CHEST PORT 1 VIEW  Result Date: 01/31/2020 CLINICAL DATA:  Respiratory failure EXAM: PORTABLE CHEST 1 VIEW COMPARISON:  01/30/2020 FINDINGS: 0616 hours. The cardio pericardial silhouette is enlarged. Bilateral chest tubes evident without appreciable pneumothorax. Right IJ catheter tip overlies the mid to distal SVC. Right PICC line remains in place with tip obscured. Impella device again noted. Vascular congestion with patchy basilar atelectasis or infiltrate. Telemetry leads overlie the chest. IMPRESSION: Stable exam. Cardiomegaly with patchy bilateral basilar predominant airspace disease. Electronically Signed   By: Misty Stanley M.D.   On: 01/31/2020 06:57   DG CHEST PORT 1 VIEW  Result Date: 01/30/2020 CLINICAL DATA:  Respiratory failure EXAM: PORTABLE CHEST 1 VIEW COMPARISON:  01/28/2020 FINDINGS: Support devices remain in place, unchanged. Bilateral chest tubes. No pneumothorax. Cardiomegaly with vascular congestion. Bibasilar opacities, likely atelectasis. IMPRESSION: Support devices stable.  No pneumothorax. Cardiomegaly, vascular congestion and bibasilar atelectasis. Electronically Signed   By: Rolm Baptise M.D.   On: 01/30/2020 14:15     Medications:     Scheduled Medications: . sodium chloride   Intravenous Once  . vitamin C  500 mg Per Tube TID  . aspirin  324 mg Per Tube Daily  . atorvastatin  80 mg Per Tube Daily  . busPIRone  10 mg Per Tube TID  . chlorhexidine gluconate (MEDLINE KIT)  15 mL Mouth Rinse BID  . Chlorhexidine Gluconate Cloth  6 each Topical Daily  . [START ON 02/05/2020] darbepoetin (ARANESP) injection - NON-DIALYSIS  100 mcg Subcutaneous Q Sat-1800  . feeding supplement (PROSource TF)  45 mL Per Tube TID  . fiber  1 packet Per Tube BID  . FLUoxetine  40 mg Per Tube BID  . insulin aspart   0-15 Units Subcutaneous Q4H  . insulin detemir  10 Units Subcutaneous BID  . lidocaine  1 patch Transdermal Q24H  . mouth rinse  15 mL Mouth Rinse 10 times per day  . melatonin  5 mg Per Tube Daily  . midodrine  15 mg Per Tube TID WC  .  multivitamin  1 tablet Per Tube QHS  . oxyCODONE  10 mg Per Tube Q8H  . pantoprazole sodium  40 mg Per Tube Daily  . QUEtiapine  50 mg Per Tube BID  . sodium chloride flush  10-40 mL Intracatheter Q12H  . sodium chloride flush  3 mL Intravenous Q12H  . thiamine  100 mg Per Tube Daily  . valproic acid  250 mg Per Tube BID    Infusions: .  prismasol BGK 4/2.5 500 mL/hr at 01/31/20 0445  .  prismasol BGK 4/2.5 200 mL/hr at 01/31/20 0445  . sodium chloride Stopped (12/28/2019 1646)  . sodium chloride 10 mL/hr at 01/11/2020 1302  . sodium chloride Stopped (01/16/20 1032)  . sodium chloride Stopped (01/29/20 0955)  . amiodarone 30 mg/hr (01/31/20 0600)  . epinephrine 5 mcg/min (01/31/20 0630)  . feeding supplement (PIVOT 1.5 CAL) 70 mL/hr at 01/31/20 0500  . fentaNYL infusion INTRAVENOUS 125 mcg/hr (01/31/20 0600)  . heparin 1,000 Units/hr (01/31/20 0600)  . impella catheter heparin 50 unit/mL in dextrose 5% 9 mL/hr at 01/21/20 0015  . lactated ringers 20 mL/hr at 01/31/20 0600  . norepinephrine (LEVOPHED) Adult infusion 10 mcg/min (01/31/20 0646)  . prismasol BGK 4/2.5 1,500 mL/hr at 01/31/20 0445  . vasopressin Stopped (01/28/20 1136)    PRN Medications: sodium chloride, sodium chloride, sodium chloride, artificial tears, bisacodyl **OR** bisacodyl, dextrose, docusate, fentaNYL, fentaNYL (SUBLIMAZE) injection, fentaNYL (SUBLIMAZE) injection, heparin, heparin, metoprolol tartrate, midazolam, ondansetron (ZOFRAN) IV, polyethylene glycol, sodium chloride flush, sodium chloride flush     Assessment/Plan   1. Cardiogenic shock/acute on chronic systolic CHF: Initially nonischemic cardiomyopathy. Possible familial cardiomyopathy as both parents had  cardiomyopathy and died at around 61.However, [employer] gene testing did not show any common mutation for cardiomyopathy.  However, this admission noted to have severe 3 vessel disease so suspect component of ischemic cardiomyopathy. [employer] Jude ICD. Echo in 8/20 with EF 15% and mildly decreased RV function. Echo this admission with EF < 20%, moderate LV dilation, RV mildly reduced, severe LAE, no significant MR.Low output HF with markedly low EF.  He has a long history of cardiomyopathy (20 yrs), tends to minimize symptoms.  Cardiorenal syndrome with creatinine up to 2.2,  stabilized on milrinone and Impella 5.5. SCr 1.44 day of CABG. CABG 12/15.  Post-op shock, back to OR 12/16 to open chest (chest wall compartment syndrome) and again to evacuate hematoma.  TEE post-op with severe RV dysfunction.  CVVHD initiated 12/19 for volume removal in setting of rising Scr/decreased UOP. Suspect ATN. S/p chest closure 12/23. Hemodynamics much worse on 12/24 with rising pressor demands. Co-ox 36%.  Bedside echo severe biventricular dysfunction with marked septal bounce. Chest re-opened 12/24.  Was atrial paced back to NSR on 12/26 with improvement in hemodynamics but back in atrial fibrillation on 12/27 and unable to pace out, DCCV to NSR on 12/28. Remains on pressors (NE 10 , Epi 5). Now off vasopressin.   - CVP down to 6 5, co-ox 74%.  - Continue to run CVVH even today.  -Hopefully chest closure this week. - Continue Impella to P8 to facilitate weaning of vasoactive meds.  LDH, platelets stable. On heparin gtt. - Continue wean NE and epinephrine. Slow process with RV dysfunction.    - Continue midodrine 15 mg tid to try to wean pressors.  2. Atrial fibrillation: H/o PAF.  He was on dofetilide in the remote past but this was stopped due to noncompliance. He had an upper GI bleed  from antral ulcers in 3/12. He was seen by GI and was deemed safe to restart anticoagulation as long as he remains on a PPI.  No apparent  recurrence of AF until just prior to this admission, was cardioverted back to NSR in ER but went back into atrial fibrillation.  Post-op afib, DCCV to NSR/BiV pacing am 12/16 -> back to AF. Rapid atrial pacing back to SR on 12/26 -> back in AF on 12/27 and unable to pace out. 12/28 DCCV to NSR.  Maintaining NSR.  - Continue IV amiodarone 30 mg/hr while on pressors.  - He is now back on heparin gtt.  - Did not have Maze with CABG.    3. CAD:   Cath this admission with severe 3 vessel disease. CABG x 4 on 12/15.  - Continue atorvastatin, ASA.  - continue current management 4. Acute on chronic hypoxemic respiratory failure: Vent per CCM.  - CCM following - Will need tracheostomy when chest fully closed.  5. AKI on CKD stage 3: Likely ATN/cardiorenal.  On CVVH.  Run CVVH even today.   -CVP  5. No urine output.  6. Diabetes: Insulin.  7. Hyponatremia: Resolved.  8. Torsades/ICD shock: 12/7/212 in hospital event, in setting of severe hypokalemia and hypomagnesemia.   9. Gout: h/o gout. Complained of rt knee pain c/w previous flares - treated w/ prednisone burst (completed).  10: ID: Treating for PNA and also with chest wall open.   - Afebrile.  - off antibiotics now per CCM/CT surgery.  11. FEN: TFs ongoing.   12. Anemia: Given 1u PRBCs 01/30/19 Hgb 7.5>8.1    Amy Clegg NP-C  01/31/2020 7:04 AM  Patient seen with NP, agree with the above note.   Stable today, remains on epinephrine 5 + NE 15.  Off milrinone.  He is in NSR on amiodarone gtt.  In NSR this morning, had brief afib overnight. CVP 5, CVVH running even.  Co-ox 74%. Impella stable P8. LDH stable.   General: Intubated/sedated.  Neck: No JVD, no thyromegaly or thyroid nodule.  Lungs: Decreased BS.  CV: Nondisplaced PMI.  Heart regular S1/S2, no S3/S4, no murmur.  1+ ankle edema.  Abdomen: Soft, nontender, no hepatosplenomegaly, no distention.  Skin: Intact without lesions or rashes.  Neurologic: Sedated, will wake up with sedation  wean.  Extremities: No clubbing or cyanosis.  HEENT: Normal.   Volume status appears optimized on CVVH, I/Os even. CVP 5.  - Run I/Os even with CVVH.   Continue slow wean of pressors, would work on getting epinephrine down to decrease trigger for AF.   Should be able to go back for chest wall closure soon.    Continue heparin gtt for AF and Impella.    CRITICAL CARE Performed by: Loralie Champagne  Total critical care time: 35 minutes  Critical care time was exclusive of separately billable procedures and treating other patients.  Critical care was necessary to treat or prevent imminent or life-threatening deterioration.  Critical care was time spent personally by me on the following activities: development of treatment plan with patient and/or surrogate as well as nursing, discussions with consultants, evaluation of patient's response to treatment, examination of patient, obtaining history from patient or surrogate, ordering and performing treatments and interventions, ordering and review of laboratory studies, ordering and review of radiographic studies, pulse oximetry and re-evaluation of patient's condition.  Loralie Champagne 01/31/2020 7:50 AM

## 2020-01-31 NOTE — Progress Notes (Signed)
6 Days Post-Op Procedure(s) (LRB): STERNAL WASH OUT (N/A) APPLICATION OF WOUND VAC (N/A) Subjective: Sedated, unable to state  Objective: Vital signs in last 24 hours: Temp:  [98.1 F (36.7 C)-98.7 F (37.1 C)] 98.7 F (37.1 C) (01/03 1200) Pulse Rate:  [73-88] 79 (01/03 1500) Cardiac Rhythm: Normal sinus rhythm (01/03 0800) Resp:  [23-28] 28 (01/03 1500) SpO2:  [100 %] 100 % (01/03 1515) FiO2 (%):  [40 %] 40 % (01/03 1515) Weight:  [102.3 kg] 102.3 kg (01/03 0448)  Hemodynamic parameters for last 24 hours: CVP:  [5 mmHg-15 mmHg] 15 mmHg  Intake/Output from previous day: 01/02 0701 - 01/03 0700 In: 4751.6 [I.V.:2303.2; Blood:315; NG/GT:1920] Out: 8453 [Drains:90; Stool:125; Chest Tube:30] Intake/Output this shift: Total I/O In: 1672.3 [I.V.:864.2; Other:68.1; NG/GT:740] Out: 1338 [Other:1338]  General appearance: cooperative Neurologic: intact Heart: regular rate and rhythm, S1, S2 normal, no murmur, click, rub or gallop Lungs: clear to auscultation bilaterally Abdomen: soft, non-tender; bowel sounds normal; no masses,  no organomegaly Extremities: edema 1+ Wound: wound vac intact; this is taken down and replaced; the sternal spacer is removed.   Lab Results: Recent Labs    01/30/20 0500 01/30/20 0530 01/31/20 0405 01/31/20 0453  WBC 13.3*  --  12.5*  --   HGB 7.5*   < > 8.1* 11.2*  HCT 25.2*   < > 25.6* 33.0*  PLT 339  --  322  --    < > = values in this interval not displayed.   BMET:  Recent Labs    01/30/20 1555 01/31/20 0405 01/31/20 0453  NA 134* 133* 135  K 4.7 4.7 4.7  CL 102 100  --   CO2 25 24  --   GLUCOSE 284* 279*  --   BUN 40* 39*  --   CREATININE 1.50* 1.59*  --   CALCIUM 8.1* 8.2*  --     PT/INR: No results for input(s): LABPROT, INR in the last 72 hours. ABG    Component Value Date/Time   PHART 7.335 (L) 01/31/2020 0453   HCO3 26.3 01/31/2020 0453   TCO2 28 01/31/2020 0453   ACIDBASEDEF 1.0 01/30/2020 0530   O2SAT 74.2  01/31/2020 0535   CBG (last 3)  Recent Labs    01/31/20 0414 01/31/20 0746 01/31/20 1213  GLUCAP 266* 220* 166*    Assessment/Plan: S/P Procedure(s) (LRB): STERNAL WASH OUT (N/A) APPLICATION OF WOUND VAC (N/A) plan OR later this week to consider chest closure. Wed or Thurs.   LOS: 32 days    [name] 01/31/2020

## 2020-01-31 NOTE — Progress Notes (Signed)
[facility] for Heparin Indication: Impella 5.5  Allergies  Allergen Reactions  . Orange Fruit Anaphylaxis, Hives and Other (See Comments)    Per Pt- Blisters around lips and Hives all over, also  . Penicillins Anaphylaxis    Did it involve swelling of the face/tongue/throat, SOB, or low BP? Yes Did it involve sudden or severe rash/hives, skin peeling, or any reaction on the inside of your mouth or nose? Yes Did you need to seek medical attention at a hospital or doctor's office? No When did it last happen?childhood If all above answers are "NO", may proceed with cephalosporin use.  [name] [Empagliflozin] Itching  . Basaglar Kwikpen [Insulin Glargine] Nausea And Vomiting    Patient Measurements: Height: 6\' 3"  (190.5 cm) Weight: 102.3 kg (225 lb 8.5 oz) IBW/kg (Calculated) : 84.5 Heparin dosing wt: 101 kg  Vital Signs: Temp: 98.7 F (37.1 C) (01/03 1200) Temp Source: Axillary (01/03 1200) Pulse Rate: 81 (01/03 1400)  Labs: Recent Labs    01/29/20 0354 01/29/20 0435 01/30/20 0451 01/30/20 0500 01/30/20 0530 01/30/20 0905 01/30/20 1350 01/30/20 1402 01/30/20 1555 01/31/20 0405 01/31/20 0453  HGB 8.0*   < >  --  7.5*   < >  --    < > 8.5*  --  8.1* 11.2*  HCT 26.9*   < >  --  25.2*   < >  --    < > 25.0*  --  25.6* 33.0*  PLT 305  --   --  339  --   --   --   --   --  322  --   HEPARINUNFRC  --    < > <0.10*  --   --  <0.10*  --   --   --  <0.10*  --   CREATININE 1.65*   < >  --  1.58*  --   --   --   --  1.50* 1.59*  --    < > = values in this interval not displayed.    Estimated Creatinine Clearance: 71.2 mL/min (A) (by C-G formula based on SCr of 1.59 mg/dL (H)).   Assessment: 50 yo male s/p impella 5.5 placement. Pt started on heparinized purge and systemic heparin. Pt now s/p CABG 12/15 with Impella remaining in place c/b significant bleeding and hematoma requiring opened chest and reexploration in OR 12/16. Chest  closed on 12/23, re-opened on 12/24. Underwent cardioversion in OR 12/28, s/p sternal wash out/application of wound VAC. Chest remains open, will dose heparin conservatively.  Impella on P8 with stable purge pressures, purge flow rate 9.2 ml/hr (approx 460 units/hr heparin). Heparin level still undetectable with systemic heparin at 1000uts/hr (max agreed upon rate with providers) CBC ok   Goal of Therapy:  Heparin level 0.2-0.3 units/ml Monitor platelets by anticoagulation protocol: Yes   Plan:  continue heparin drip 1000 uts/hr   heparin at 460 units/hr through purge, so getting 1460 units/hr (~10 units/kg).  Daily heparin level and CBC.   Bonnita Nasuti Pharm.D. CPP, BCPS Clinical Pharmacist [phone] 01/31/2020 2:49 PM     [facility] pharmacy phone numbers are listed on [email or web address]

## 2020-01-31 NOTE — Progress Notes (Signed)
NAME:  Corey EASTRIDGE, MRN:  [id number], DOB:  [birth year], LOS: 29 ADMISSION DATE:  01/28/2020, CONSULTATION DATE: 01/20/2020 REFERRING MD: Aundra Dubin, CHIEF COMPLAINT: Respiratory failure post Impella  HPI/course in hospital  50 year old man admitted with A. fib/RVR, torsades, cardiogenic shock [EF 15] requiring Impella, CABG x5, AKI requiring initiation of CRRT  Past Medical History:    has a past medical history of Anginal pain ([place]), Anxiety, Arthritis, Automatic implantable cardioverter-defibrillator in situ (2007), Bipolar disorder ([place]), CAD (coronary artery disease), Cancer ([place]) (2013), CHF (congestive heart failure) ([place]), Chondrocalcinosis of right knee (05/16/6220), Chronic systolic heart failure ([place]), Depression, Diabetes uncomplicated adult-type II, Dilated cardiomyopathy ([place]), Dyslipidemia, Dysrhythmia, GERD (gastroesophageal reflux disease), Gout, unspecified, Hepatic steatosis (2011), History of kidney stones, echocardiogram, Hypertension, Obesity (BMI 30.0-34.9), Pacemaker, Paroxysmal atrial fibrillation ([place]), Peptic ulcer, Shortness of breath, and Sleep apnea.  Significant Hospital Events:  12/2 Afib with RVR. Cardioverted in ED. AICD did not fire. Milrinone for low co ox.  12/7 ICD fired, Torsades- likely from low K of 2.8.  12/8 Cath- multivessel disease w/ low CO. Volume overloaded.  12/11 Underwent Impella 5.5 insertion for persistent symptoms of forward failure with low cardiac indices. 12/12 Back to the OR for displacement of Impella. Extubated.  12/15 CABG x4, with LIMA-LAD, SVG-PDA/PLV, SVG-OM 12/16 Emergently opened chest at bedside for tamponade, clot compressing right atrium  12/16 To OR for Exploration of chest due to bleeding  12/19 Started CRRT 12/23 Sternal Closure in OR 12/24 Worsening shock.  Chest reopened at bedside with improvement in hemodynamics.  No mediastinal hematoma. 12/25 A flutter > rapid a paced to sinus rhythm.  Remains on high-dose pressors,  inotropes 12/28 Weaning vasopressor requirements.  For chest washout today.  Back in atrial fibrillation, refractory to increased amiodarone and attempted overdrive pacing.  DCCV to NSR. Tolerating fluid removal via CRRT. 1/3: Appears euvolemic.  Adjusting glycemic control.  Chest still open so not weaning Consults:  PCCM, nephrology, cardiology, cardiothoracic surgery  Procedures:  12/2 R PICC >> 12/11 right ax impella 5.5 >> 12/15 ETT >> 12/15 right femoral arterial sheath >> 12/19 right femoral HD >> 12/23 chest tube-right pleural, left pleural , mediastinal  >>  Significant Diagnostic Tests:    Micro Data:  Blood culture 12/14 > negative  Blood culture 12/16 > negative Respiratory culture 12/20 > rare candida albicans Sternal wound culture 12/23 > no growth Fungal cx sternal wound 12/23 >> neg Fungal cx w/stain sternal wound 12/23 >> AFB sternal wound 12/23 >>   Antimicrobials:  Vancomycin 12/16 >>12/31 Meropenem 12/16 >> 12/31  Interval history:   Awake.  Complains of some abdominal discomfort  Objective   Blood pressure (Abnormal) 91/53, pulse 79, temperature 98.5 F (36.9 C), temperature source Axillary, resp. rate (Abnormal) 28, height 6\' 3"  (1.905 m), weight 102.3 kg, SpO2 100 %. CVP:  [4 mmHg-13 mmHg] 13 mmHg  Vent Mode: PRVC FiO2 (%):  [40 %] 40 % Set Rate:  [28 bmp] 28 bmp Vt Set:  [510 mL] 510 mL PEEP:  [5 cmH20] 5 cmH20 Plateau Pressure:  [22 cmH20-26 cmH20] 22 cmH20   Intake/Output Summary (Last 24 hours) at 01/31/2020 0846 Last data filed at 01/31/2020 0800 Gross per 24 hour  Intake 4506.25 ml  Output 4864 ml  Net -357.75 ml   Filed Weights   01/29/20 0615 01/30/20 0434 01/31/20 0448  Weight: 106 kg 103.7 kg 102.3 kg   CVP:  [4 mmHg-13 mmHg] 13 mmHg  Examination:  General 50 year old white male remains  on full ventilatory support with open chest HEENT normocephalic atraumatic orally intubated Cardiac: Currently regular rate and rhythm sinus on  telemetry has open chest with wound VAC, wound borders with notable necrotic appearing tissue Pulmonary equal chest rise bilaterally currently clear to auscultation Abdomen soft not tender Extremities warm dry Neuro attempts to communicate, able to move extremities. GU clear yellow   Assessment & Plan:   acute hypoxic respiratory failure requiring mechanical ventilation following cardiac surgery. -Remains on full ventilator support, not a candidate for weaning as of yet -Portable chest x-ray personally reviewed cardiomegaly: Endotracheal tube in satisfactory position.  Right IJ catheter in satisfactory position.  Bilateral interstitial prominence, with right basilar airspace disease perhaps a little improved Plan Continue full ventilator support PAD protocol VAP bundle As needed chest x-ray Would consider tracheostomy placement at time of chest closure, anticipate prolonged   Circulatory shock due to  vasoplegic and cardiogenic shock following high risk CABG. Post cardiac surgery RV dysfunction CAD s/p CABG, c/b cardiac tamponade, hematoma evac. Chest closed with VAC.   Heart failure team plan reviewed.  Remains on norepinephrine, epinephrine, and Impella on P8, cooximetry currently 74%.  CVP 5. Plan Shooting for even volume status on CRRT Continuing Impella at P8 in effort to wean vasoactive drips Continue IV heparin Continue to wean norepinephrine Midodrine 15 mg 3 times daily  Hoping for chest closure later this week Continue atorvastatin and aspirin  Afib Currently normal sinus Plan Continue IV amiodarone Continue IV heparin Continue telemetry monitoring  Acute kidney injury with anuria -improving cardiorenal syndrome  Plan Continue CRRT Serial chemistries  DM2 with hyperglycemia: Still hyperglycemic blood glucose in the 220-260 range  plan Change sliding scale to resistant Increase Levemir to 15 units twice daily If still not within goal of 140-180 on 1/4 will  add tube feed coverage  Acute blood loss anemia Plan: Trend CBC   Daily Goals Checklist  Pain/Anxiety/Delirium protocol (if indicated): fent gtt, PRN versed [facility] oxy VAP protocol (if indicated): bundle in place. Respiratory support goals: full ventilatory support. Wean once chest closed.  DVT prophylaxis:  Heparin Nutrition Status: high nutritional risk. EN per [place]. GI prophylaxis: pantoprazole Fluid status goals: CRRT to net even fluid Urinary catheter: n/a Central lines: PICC, PAC, HD catheter. Glucose control: 1/3 increasing Levemir to 15 units twice daily, changing sliding scale to resistant Mobility/therapy needs: bed rest  Daily labs: CBC, renal function, cooximetry Code Status: full  Family Communication: wife updated 1/2 Disposition: ICU   Critical care time:  34 minutes    Erick Colace ACNP-BC [place] Pager # [phone] OR # [phone] if no answer

## 2020-02-01 ENCOUNTER — Inpatient Hospital Stay (HOSPITAL_COMMUNITY): Payer: BC Managed Care – PPO

## 2020-02-01 DIAGNOSIS — R739 Hyperglycemia, unspecified: Secondary | ICD-10-CM

## 2020-02-01 DIAGNOSIS — R570 Cardiogenic shock: Secondary | ICD-10-CM | POA: Diagnosis not present

## 2020-02-01 DIAGNOSIS — J9601 Acute respiratory failure with hypoxia: Secondary | ICD-10-CM | POA: Diagnosis not present

## 2020-02-01 DIAGNOSIS — I4891 Unspecified atrial fibrillation: Secondary | ICD-10-CM | POA: Diagnosis not present

## 2020-02-01 DIAGNOSIS — J9602 Acute respiratory failure with hypercapnia: Secondary | ICD-10-CM

## 2020-02-01 DIAGNOSIS — Z9911 Dependence on respirator [ventilator] status: Secondary | ICD-10-CM | POA: Diagnosis not present

## 2020-02-01 LAB — POCT I-STAT 7, (LYTES, BLD GAS, ICA,H+H)
Acid-Base Excess: 1 mmol/L (ref 0.0–2.0)
Bicarbonate: 26.6 mmol/L (ref 20.0–28.0)
Calcium, Ion: 1.22 mmol/L (ref 1.15–1.40)
HCT: 29 % — ABNORMAL LOW (ref 39.0–52.0)
Hemoglobin: 9.9 g/dL — ABNORMAL LOW (ref 13.0–17.0)
O2 Saturation: 99 %
Patient temperature: 100.4
Potassium: 4.9 mmol/L (ref 3.5–5.1)
Sodium: 132 mmol/L — ABNORMAL LOW (ref 135–145)
TCO2: 28 mmol/L (ref 22–32)
pCO2 arterial: 49.3 mmHg — ABNORMAL HIGH (ref 32.0–48.0)
pH, Arterial: 7.344 — ABNORMAL LOW (ref 7.350–7.450)
pO2, Arterial: 137 mmHg — ABNORMAL HIGH (ref 83.0–108.0)

## 2020-02-01 LAB — CBC WITH DIFFERENTIAL/PLATELET
Abs Immature Granulocytes: 0.24 10*3/uL — ABNORMAL HIGH (ref 0.00–0.07)
Basophils Absolute: 0.1 10*3/uL (ref 0.0–0.1)
Basophils Relative: 1 %
Eosinophils Absolute: 0.4 10*3/uL (ref 0.0–0.5)
Eosinophils Relative: 4 %
HCT: 26.5 % — ABNORMAL LOW (ref 39.0–52.0)
Hemoglobin: 8.2 g/dL — ABNORMAL LOW (ref 13.0–17.0)
Immature Granulocytes: 2 %
Lymphocytes Relative: 8 %
Lymphs Abs: 1 10*3/uL (ref 0.7–4.0)
MCH: 28.6 pg (ref 26.0–34.0)
MCHC: 30.9 g/dL (ref 30.0–36.0)
MCV: 92.3 fL (ref 80.0–100.0)
Monocytes Absolute: 1.3 10*3/uL — ABNORMAL HIGH (ref 0.1–1.0)
Monocytes Relative: 10 %
Neutro Abs: 9.2 10*3/uL — ABNORMAL HIGH (ref 1.7–7.7)
Neutrophils Relative %: 75 %
Platelets: 373 10*3/uL (ref 150–400)
RBC: 2.87 MIL/uL — ABNORMAL LOW (ref 4.22–5.81)
RDW: 21.8 % — ABNORMAL HIGH (ref 11.5–15.5)
WBC: 12.1 10*3/uL — ABNORMAL HIGH (ref 4.0–10.5)
nRBC: 0.2 % (ref 0.0–0.2)

## 2020-02-01 LAB — COMPREHENSIVE METABOLIC PANEL
ALT: 46 U/L — ABNORMAL HIGH (ref 0–44)
AST: 63 U/L — ABNORMAL HIGH (ref 15–41)
Albumin: 1.9 g/dL — ABNORMAL LOW (ref 3.5–5.0)
Alkaline Phosphatase: 228 U/L — ABNORMAL HIGH (ref 38–126)
Anion gap: 10 (ref 5–15)
BUN: 44 mg/dL — ABNORMAL HIGH (ref 6–20)
CO2: 23 mmol/L (ref 22–32)
Calcium: 8 mg/dL — ABNORMAL LOW (ref 8.9–10.3)
Chloride: 100 mmol/L (ref 98–111)
Creatinine, Ser: 1.7 mg/dL — ABNORMAL HIGH (ref 0.61–1.24)
GFR, Estimated: 48 mL/min — ABNORMAL LOW (ref >60)
Glucose, Bld: 262 mg/dL — ABNORMAL HIGH (ref 70–99)
Potassium: 4.9 mmol/L (ref 3.5–5.1)
Sodium: 133 mmol/L — ABNORMAL LOW (ref 135–145)
Total Bilirubin: 1 mg/dL (ref 0.3–1.2)
Total Protein: 5.8 g/dL — ABNORMAL LOW (ref 6.5–8.1)

## 2020-02-01 LAB — HEPATIC FUNCTION PANEL
ALT: 46 U/L — ABNORMAL HIGH (ref 0–44)
AST: 62 U/L — ABNORMAL HIGH (ref 15–41)
Albumin: 2 g/dL — ABNORMAL LOW (ref 3.5–5.0)
Alkaline Phosphatase: 229 U/L — ABNORMAL HIGH (ref 38–126)
Bilirubin, Direct: 0.3 mg/dL — ABNORMAL HIGH (ref 0.0–0.2)
Indirect Bilirubin: 0.4 mg/dL (ref 0.3–0.9)
Total Bilirubin: 0.7 mg/dL (ref 0.3–1.2)
Total Protein: 6.1 g/dL — ABNORMAL LOW (ref 6.5–8.1)

## 2020-02-01 LAB — PREALBUMIN: Prealbumin: 7.2 mg/dL — ABNORMAL LOW (ref 18–38)

## 2020-02-01 LAB — RENAL FUNCTION PANEL
Albumin: 1.9 g/dL — ABNORMAL LOW (ref 3.5–5.0)
Anion gap: 7 (ref 5–15)
BUN: 43 mg/dL — ABNORMAL HIGH (ref 6–20)
CO2: 23 mmol/L (ref 22–32)
Calcium: 8.2 mg/dL — ABNORMAL LOW (ref 8.9–10.3)
Chloride: 102 mmol/L (ref 98–111)
Creatinine, Ser: 1.54 mg/dL — ABNORMAL HIGH (ref 0.61–1.24)
GFR, Estimated: 54 mL/min — ABNORMAL LOW (ref >60)
Glucose, Bld: 142 mg/dL — ABNORMAL HIGH (ref 70–99)
Phosphorus: 2.4 mg/dL — ABNORMAL LOW (ref 2.5–4.6)
Potassium: 4.7 mmol/L (ref 3.5–5.1)
Sodium: 132 mmol/L — ABNORMAL LOW (ref 135–145)

## 2020-02-01 LAB — LACTATE DEHYDROGENASE: LDH: 390 U/L — ABNORMAL HIGH (ref 98–192)

## 2020-02-01 LAB — COOXEMETRY PANEL
Carboxyhemoglobin: 2.1 % — ABNORMAL HIGH (ref 0.5–1.5)
Methemoglobin: 0.9 % (ref 0.0–1.5)
O2 Saturation: 69 %
Total hemoglobin: 7.9 g/dL — ABNORMAL LOW (ref 12.0–16.0)

## 2020-02-01 LAB — PHOSPHORUS: Phosphorus: 2.5 mg/dL (ref 2.5–4.6)

## 2020-02-01 LAB — GLUCOSE, CAPILLARY
Glucose-Capillary: 254 mg/dL — ABNORMAL HIGH (ref 70–99)
Glucose-Capillary: 152 mg/dL — ABNORMAL HIGH (ref 70–99)
Glucose-Capillary: 142 mg/dL — ABNORMAL HIGH (ref 70–99)
Glucose-Capillary: 158 mg/dL — ABNORMAL HIGH (ref 70–99)

## 2020-02-01 LAB — HEPARIN LEVEL (UNFRACTIONATED): Heparin Unfractionated: <0.1 IU/mL — ABNORMAL LOW (ref 0.30–0.70)

## 2020-02-01 LAB — MAGNESIUM: Magnesium: 2.3 mg/dL (ref 1.7–2.4)

## 2020-02-01 MED ORDER — SODIUM CHLORIDE 0.9 % IV SOLN
1.0000 g | Freq: Three times a day (TID) | INTRAVENOUS | Status: DC
  Administered 2020-02-01 – 2020-02-09 (×24): 1 g via INTRAVENOUS
  Filled 2020-02-01 (×26): qty 1

## 2020-02-01 MED ORDER — INSULIN REGULAR(HUMAN) IN NACL 100-0.9 UT/100ML-% IV SOLN
INTRAVENOUS | Status: DC
  Administered 2020-02-01: 10 [IU]/h via INTRAVENOUS
  Administered 2020-02-02: 1.1 [IU]/h via INTRAVENOUS
  Filled 2020-02-01: qty 100

## 2020-02-01 MED ORDER — DEXTROSE 50 % IV SOLN: 0.0000 mL | INTRAVENOUS | Status: DC | PRN

## 2020-02-01 MED ORDER — VANCOMYCIN HCL 2000 MG/400ML IV SOLN
2000.0000 mg | Freq: Once | INTRAVENOUS | Status: AC
  Administered 2020-02-01: 2000 mg via INTRAVENOUS
  Filled 2020-02-01: qty 400

## 2020-02-01 MED ORDER — VANCOMYCIN HCL IN DEXTROSE 1-5 GM/200ML-% IV SOLN
1000.0000 mg | INTRAVENOUS | Status: DC
  Administered 2020-02-02 – 2020-02-08 (×7): 1000 mg via INTRAVENOUS
  Filled 2020-02-01 (×7): qty 200

## 2020-02-01 NOTE — Progress Notes (Signed)
      [address]       [place] [address]             [phone]      Evening rounds  Intubated, responsive  BP 94/76   Pulse 78   Temp 98.4 F (36.9 C) (Axillary)   Resp (!) 28   Ht 6\' 3"  (1.905 m)   Wt 102.3 kg   SpO2 99%   BMI 28.19 kg/m  impella flowing 4.2 L CVP= 12  Intake/Output Summary (Last 24 hours) at 02/01/2020 1756 Last data filed at 02/01/2020 1700 Gross per 24 hour  Intake 5055.44 ml  Output 5456 ml  Net -400.56 ml   Plan per Dr. Kittie Plater C. Roxan Hockey, MD [facility] [phone]

## 2020-02-01 NOTE — Progress Notes (Signed)
[place] for Heparin Indication: Impella 5.5  Allergies  Allergen Reactions  . Orange Fruit Anaphylaxis, Hives and Other (See Comments)    Per Pt- Blisters around lips and Hives all over, also  . Penicillins Anaphylaxis    Did it involve swelling of the face/tongue/throat, SOB, or low BP? Yes Did it involve sudden or severe rash/hives, skin peeling, or any reaction on the inside of your mouth or nose? Yes Did you need to seek medical attention at a hospital or doctor's office? No When did it last happen?childhood If all above answers are "NO", may proceed with cephalosporin use.  [name] [Empagliflozin] Itching  . Basaglar Kwikpen [Insulin Glargine] Nausea And Vomiting    Patient Measurements: Height: 6\' 3"  (190.5 cm) Weight: 102.3 kg (225 lb 8.5 oz) IBW/kg (Calculated) : 84.5 Heparin dosing wt: 101 kg  Vital Signs: Temp: 98.7 F (37.1 C) (01/04 0800) Temp Source: Axillary (01/04 0800) BP: 88/73 (01/04 0400) Pulse Rate: 73 (01/04 1000)  Labs: Recent Labs    01/30/20 0500 01/30/20 0530 01/30/20 0905 01/30/20 1350 01/31/20 0405 01/31/20 0453 01/31/20 1549 02/01/20 0415 02/01/20 0422  HGB 7.5*   < >  --    < > 8.1* 11.2*  --  8.2* 9.9*  HCT 25.2*   < >  --    < > 25.6* 33.0*  --  26.5* 29.0*  PLT 339  --   --   --  322  --   --  373  --   HEPARINUNFRC  --   --  <0.10*  --  <0.10*  --   --  <0.10*  --   CREATININE 1.58*  --   --    < > 1.59*  --  1.59* 1.70*  --    < > = values in this interval not displayed.    Estimated Creatinine Clearance: 66.6 mL/min (A) (by C-G formula based on SCr of 1.7 mg/dL (H)).   Assessment: 50 yo male s/p impella 5.5 placement. Pt started on heparinized purge and systemic heparin. Pt now s/p CABG 12/15 with Impella remaining in place c/b significant bleeding and hematoma requiring opened chest and reexploration in OR 12/16. Chest closed on 12/23, re-opened on 12/24. Underwent cardioversion in  OR 12/28, s/p sternal wash out/application of wound VAC. Chest remains open, will dose heparin conservatively.  Impella on P8 with stable purge pressures, purge flow rate 9 ml/hr (approx 450 units/hr heparin). Heparin level still undetectable with systemic heparin at 1000uts/hr (max agreed upon rate with providers) hgb varies PRBC as needed with wound vac and CT drainage, LDH stable, pltc stable    Goal of Therapy:  Heparin level 0.2-0.3 units/ml Monitor platelets by anticoagulation protocol: Yes   Plan:  continue heparin drip 1000 uts/hr  heparin at 450 units/hr through purge, so getting total heparin 1450 units/hr Daily heparin level and CBC.   Bonnita Nasuti Pharm.D. CPP, BCPS Clinical Pharmacist [phone] 02/01/2020 10:12 AM     [facility] pharmacy phone numbers are listed on [email or web address]

## 2020-02-01 NOTE — Progress Notes (Signed)
[facility] Progress Note    Assessment/ Plan:   #AKI/CKD stage III- presumably due to ischemic ATN in setting of cardiogenic shock s/p CABG.  CRRT started 01/16/20. All fluids 4K/2.5Ca: Pre-filter 500 ml/hr, post filter 200 ml/hr, dialysate 1500 ml/hr. Right femoral HD catheter placed 01/16/20 switched to RIJ on 01/28/20.  - continue CRRT--> keep even; hoping to start gentle UF sometime this week, in light of fever will not do today- will keep a close eye on hemodynamics   # CAD s/p CABG complicated by cardiogenic shock and post-op hematoma. secondary closure on 12/23 then had to be re opened.  Went to the OR for washout on 12/28.  Hopeful for closure sometime this week  # Cardiogenic shock- underlying familial cardiomyopathy s/p redo CABG, EF < 20%. Remains on pressors: Levophed, off epi, on midodrine 15 TID. Impella P8 Continue mgmt per primary  # ABLA- s/p postoperative bleeding into chest cavity s/p re-exploration for tamponade/hematoma. CT surgery following.stable, on aranesp 60mcg weekly started 12/25. Increased to 100mcg and transfuse as needed  #Hypophosphatemia- due to CRRT. replete prn  # Atrial fibrillation - s/p DCCV on 12/16.  On amiodarone. Has been in NSR for about a week  # VDRF- per primary svc.  #-Leukocytosis/ new fever: cultures to be drawn 02/01/20, off antibiotics  # Hypoalbuminemia: albumin improved s/p 50g Redose as needed  Subjective:    Seen in room.  S/p bedside washout and woundvac change yesterday.  Tmax 100.4, to have cultures drawn.  Impella at P8, off epi today.     Objective:   BP (!) 88/73 (BP Location: Left Arm)   Pulse 94   Temp 98.7 F (37.1 C) (Axillary)   Resp (!) 21   Ht 6' 3" (1.905 m)   Wt 102.3 kg   SpO2 99%   BMI 28.19 kg/m   Intake/Output Summary (Last 24 hours) at 02/01/2020 0952 Last data filed at 02/01/2020 0900 Gross per 24 hour  Intake 4519.24 ml  Output 4990 ml  Net -470.76 ml   Weight  change: 0 kg  Physical Exam: Gen: sedated CVS: Impella heard CHEST: woundvac in place, 2 chest tubes in place Resp: mech breath sounds Abd: no distention Ext: some mild dependent edema ACCESS: R IJ nontunneled HD cath  Imaging: DG Abd 1 View  Result Date: 01/31/2020 CLINICAL DATA:  Abdominal pain. EXAM: ABDOMEN - 1 VIEW COMPARISON:  01/24/2020 FINDINGS: 0621 hours. Feeding tube tip is in the distal stomach near the pylorus. Diffuse gaseous distension of right and transverse colon with scattered small bowel gas evident. IMPRESSION: Feeding tube tip is in the distal stomach near the pylorus. No radiographic features of overt small bowel obstruction although gaseous distention of colon and scattered small bowel may reflect component of ileus. Electronically Signed   By: Eric  Mansell M.D.   On: 01/31/2020 07:12   DG Chest Port 1 View  Result Date: 02/01/2020 CLINICAL DATA:  Intubation.  Chest tube.  Open-heart surgery. EXAM: PORTABLE CHEST 1 VIEW COMPARISON:  01/31/2020 FINDINGS: Patient rotated to the left. Endotracheal tube, feeding tube, right IJ line, bilateral chest tubes in stable position. Impella device and cardiac pacer in stable position. Low lung volumes with bibasilar atelectasis/infiltrates. Similar findings on prior exam. No pleural effusion or pneumothorax. Surgical staples right chest. IMPRESSION: 1. Lines and tubes including bilateral chest tubes in stable position. Impella device and cardiac pacer in stable position. 2. Low lung volumes with bibasilar atelectasis/infiltrates. Similar findings on prior exam. 3.    Stable cardiomegaly. Electronically Signed   By: Thomas  Register   On: 02/01/2020 06:01   DG CHEST PORT 1 VIEW  Result Date: 01/31/2020 CLINICAL DATA:  Respiratory failure EXAM: PORTABLE CHEST 1 VIEW COMPARISON:  01/30/2020 FINDINGS: 0616 hours. The cardio pericardial silhouette is enlarged. Bilateral chest tubes evident without appreciable pneumothorax. Right IJ catheter  tip overlies the mid to distal SVC. Right PICC line remains in place with tip obscured. Impella device again noted. Vascular congestion with patchy basilar atelectasis or infiltrate. Telemetry leads overlie the chest. IMPRESSION: Stable exam. Cardiomegaly with patchy bilateral basilar predominant airspace disease. Electronically Signed   By: Eric  Mansell M.D.   On: 01/31/2020 06:57   DG CHEST PORT 1 VIEW  Result Date: 01/30/2020 CLINICAL DATA:  Respiratory failure EXAM: PORTABLE CHEST 1 VIEW COMPARISON:  01/28/2020 FINDINGS: Support devices remain in place, unchanged. Bilateral chest tubes. No pneumothorax. Cardiomegaly with vascular congestion. Bibasilar opacities, likely atelectasis. IMPRESSION: Support devices stable.  No pneumothorax. Cardiomegaly, vascular congestion and bibasilar atelectasis. Electronically Signed   By: Kevin  Dover M.D.   On: 01/30/2020 14:15    Labs: BMET Recent Labs  Lab 01/29/20 0354 01/29/20 0435 01/29/20 1546 01/29/20 1550 01/30/20 0500 01/30/20 0530 01/30/20 1402 01/30/20 1555 01/31/20 0405 01/31/20 0453 01/31/20 1549 02/01/20 0415 02/01/20 0422  NA 133*   < > 135   < > 134*   < > 135 134* 133* 135 130* 133* 132*  K 4.9   < > 4.6   < > 4.5   < > 4.8 4.7 4.7 4.7 4.9 4.9 4.9  CL 100  --  102  --  102  --   --  102 100  --  98 100  --   CO2 24  --  25  --  25  --   --  25 24  --  24 23  --   GLUCOSE 304*  --  273*  --  270*  --   --  284* 279*  --  289* 262*  --   BUN 37*  --  37*  --  38*  --   --  40* 39*  --  43* 44*  --   CREATININE 1.65*  --  1.53*  --  1.58*  --   --  1.50* 1.59*  --  1.59* 1.70*  --   CALCIUM 7.8*  --  8.0*  --  8.1*  --   --  8.1* 8.2*  --  7.8* 8.0*  --   PHOS 2.7  --  2.5  --  2.6  --   --  2.7 2.5  --  2.5 2.5  --    < > = values in this interval not displayed.   CBC Recent Labs  Lab 01/29/20 0354 01/29/20 0435 01/30/20 0500 01/30/20 0530 01/31/20 0405 01/31/20 0453 02/01/20 0415 02/01/20 0422  WBC 15.1*  --  13.3*   --  12.5*  --  12.1*  --   NEUTROABS 11.2*  --  9.7*  --  9.6*  --  9.2*  --   HGB 8.0*   < > 7.5*   < > 8.1* 11.2* 8.2* 9.9*  HCT 26.9*   < > 25.2*   < > 25.6* 33.0* 26.5* 29.0*  MCV 93.7  --  95.5  --  92.1  --  92.3  --   PLT 305  --  339  --  322  --  373  --    < > =   values in this interval not displayed.    Medications:    . vitamin C  500 mg Per Tube TID  . aspirin  324 mg Per Tube Daily  . atorvastatin  80 mg Per Tube Daily  . busPIRone  10 mg Per Tube TID  . chlorhexidine gluconate (MEDLINE KIT)  15 mL Mouth Rinse BID  . Chlorhexidine Gluconate Cloth  6 each Topical Daily  . [START ON 02/05/2020] darbepoetin (ARANESP) injection - NON-DIALYSIS  100 mcg Subcutaneous Q Sat-1800  . feeding supplement (PROSource TF)  45 mL Per Tube TID  . fiber  1 packet Per Tube BID  . FLUoxetine  40 mg Per Tube BID  . lidocaine  1 patch Transdermal Q24H  . mouth rinse  15 mL Mouth Rinse 10 times per day  . melatonin  5 mg Per Tube Daily  . midodrine  15 mg Per Tube TID WC  . multivitamin  1 tablet Per Tube QHS  . oxyCODONE  10 mg Per Tube Q8H  . pantoprazole sodium  40 mg Per Tube Daily  . QUEtiapine  50 mg Per Tube BID  . sodium chloride flush  10-40 mL Intracatheter Q12H  . sodium chloride flush  3 mL Intravenous Q12H  . thiamine  100 mg Per Tube Daily  . valproic acid  250 mg Per Tube BID      Madelon Lips, MD 02/01/2020, 9:52 AM

## 2020-02-01 NOTE — Plan of Care (Signed)
Pt having stable night. Impella & CRRT settings unchanged--pt is tolerating well. No suction events. Because of ~553mL net positive fluid status, this RN is gently pulling to gradually reach net even. Able to wean epi from 5 to 47mcg. Levo unchanged from arrival (55mcg). Pt noted to be febrile at 0000 (100.1). Will monitor temp closely as well as s/s infection & WBC in AM. New stage II pressure injury found on R buttock early AM of 1/3--WOC consulted. Keeping on sides at all times. Additionally, pt appears to have pressure injuries on his tongue, lips and oral mucosa from prolonged ETT requirement. CBGs better controlled with SSI & LAI changes. Minor output from [facility] & CTs. Tolerating lower dose of fentanyl at this time. Follows all commands. Remaining in NSR. CVP 8-10. Bladder scans yet to warrant I&O cath. RN will continue to monitor closely.  Problem: Cardiac: Goal: Ability to achieve and maintain adequate cardiopulmonary perfusion will improve Outcome: Progressing   Problem: Clinical Measurements: Goal: Ability to maintain clinical measurements within normal limits will improve Outcome: Progressing Goal: Diagnostic test results will improve Outcome: Progressing   Problem: Coping: Goal: Level of anxiety will decrease Outcome: Progressing   Problem: Clinical Measurements: Goal: Respiratory complications will improve Outcome: Not Progressing Goal: Cardiovascular complication will be avoided Outcome: Not Progressing   Problem: Skin Integrity: Goal: Risk for impaired skin integrity will decrease Outcome: Not Progressing   Problem: Education: Goal: Ability to demonstrate management of disease process will improve Outcome: Not Applicable Goal: Ability to verbalize understanding of medication therapies will improve Outcome: Not Applicable Goal: Individualized Educational Video(s) Outcome: Not Applicable   Problem: Activity: Goal: Capacity to carry out activities will improve Outcome: Not  Applicable

## 2020-02-01 NOTE — Progress Notes (Addendum)
Patient ID: Corey Palmer, male   DOB: Sep 07, 1970, 50 y.o.   MRN: [id number]     Advanced Heart Failure Rounding Note  PCP-Cardiologist: No primary care provider on file.   Subjective:    - 12/7 Torsades -ICD shock x1.  - 12/11 Impella 5.5 placed.  - 12/12 Impella repositioned in OR - 12/15 CABG x 4 with LIMA-LAD, SVG-PDA/PLV, SVG-OM - 12/16 DCCV afib.  Back to OR to re-open chest and again to evacuate hematoma.  - 12/19 CVVH initiated  - 12/23 Chest closed - 12/24 Chest reopened - 12/26 RAP back into NSR - 12/27 back in atrial fibrillation, unable to rapid atrial pace out.   - 12/28 To OR, chest partially closed and wound vac placed.  DCCV to NSR.  - 12/31 Antibiotics stopped. Swan removed, HD catheter moved from right femoral to right IJ.   On NE 25. Off epi. Impella at P8.     CO-OX 69%.   CVVH running even.    In NSR 80s   On amiodarone gtt 30 mg/hr.   Awake on vent.   Impella 5.5 P-8  Flow 4.1 Heparin systemically Waveforms ok. No alarms LDH 390   Echo (12/10): EF <20%, moderate LV dilation, normal RV size with mildly decreased systolic function.    Objective:   Weight Range: 102.3 kg Body mass index is 28.19 kg/m.   Vital Signs:   Temp:  [98.4 F (36.9 C)-100.4 F (38 C)] 100.4 F (38 C) (01/04 0400) Pulse Rate:  [46-103] 103 (01/04 0700) Resp:  [16-29] 22 (01/04 0700) BP: (85-101)/(60-82) 88/73 (01/04 0400) SpO2:  [97 %-100 %] 100 % (01/04 0700) FiO2 (%):  [40 %] 40 % (01/04 0415) Weight:  [102.3 kg] 102.3 kg (01/04 0500) Last BM Date: 01/31/20  Weight change: Filed Weights   01/30/20 0434 01/31/20 0448 02/01/20 0500  Weight: 103.7 kg 102.3 kg 102.3 kg    Intake/Output:   Intake/Output Summary (Last 24 hours) at 02/01/2020 0736 Last data filed at 02/01/2020 0700 Gross per 24 hour  Intake 4793.68 ml  Output 4992 ml  Net -198.32 ml      Physical Exam  CVP 10   General:  Awake on vent.  HEENT: ETT Neck: supple. JVP difficult to assess  with lives. . Carotids 2+ bilat; no bruits. No lymphadenopathy or thryomegaly appreciated. Cor: PMI nondisplaced. Regular rate & rhythm. No rubs, gallops or murmurs. Open chest with VAC Lungs: Decreased in the bases Abdomen: soft, nontender, nondistended. No hepatosplenomegaly. No bruits or masses. Good bowel sounds. Extremities: no cyanosis, clubbing, rash, edema Neuro: Awake on vent. Follows commands. MAEx4   Telemetry  NSR 80s had some episodes of A Fib.    Labs    CBC Recent Labs    01/31/20 0405 01/31/20 0453 02/01/20 0415 02/01/20 0422  WBC 12.5*  --  12.1*  --   NEUTROABS 9.6*  --  9.2*  --   HGB 8.1*   < > 8.2* 9.9*  HCT 25.6*   < > 26.5* 29.0*  MCV 92.1  --  92.3  --   PLT 322  --  373  --    < > = values in this interval not displayed.   Basic Metabolic Panel Recent Labs    01/31/20 0405 01/31/20 0453 01/31/20 1549 02/01/20 0415 02/01/20 0422  NA 133*   < > 130* 133* 132*  K 4.7   < > 4.9 4.9 4.9  CL 100  --  98 100  --  CO2 24  --  24 23  --   GLUCOSE 279*  --  289* 262*  --   BUN 39*  --  43* 44*  --   CREATININE 1.59*  --  1.59* 1.70*  --   CALCIUM 8.2*  --  7.8* 8.0*  --   MG 2.4  --   --  2.3  --   PHOS 2.5  --  2.5 2.5  --    < > = values in this interval not displayed.   Liver Function Tests Recent Labs    01/31/20 1549 02/01/20 0415  AST  --  62*  63*  ALT  --  46*  46*  ALKPHOS  --  229*  228*  BILITOT  --  0.7  1.0  PROT  --  6.1*  5.8*  ALBUMIN 1.9* 2.0*  1.9*   No results for input(s): LIPASE, AMYLASE in the last 72 hours. Cardiac Enzymes No results for input(s): CKTOTAL, CKMB, CKMBINDEX, TROPONINI in the last 72 hours.  BNP: BNP (last 3 results) Recent Labs    01/21/2020 1619  BNP 577.5*    ProBNP (last 3 results) No results for input(s): PROBNP in the last 8760 hours.   D-Dimer No results for input(s): DDIMER in the last 72 hours. Hemoglobin A1C No results for input(s): HGBA1C in the last 72 hours. Fasting  Lipid Panel No results for input(s): CHOL, HDL, LDLCALC, TRIG, CHOLHDL, LDLDIRECT in the last 72 hours. Thyroid Function Tests No results for input(s): TSH, T4TOTAL, T3FREE, THYROIDAB in the last 72 hours.  Invalid input(s): FREET3  Other results:   Imaging    DG Chest Port 1 View  Result Date: 02/01/2020 CLINICAL DATA:  Intubation.  Chest tube.  Open-heart surgery. EXAM: PORTABLE CHEST 1 VIEW COMPARISON:  01/31/2020 FINDINGS: Patient rotated to the left. Endotracheal tube, feeding tube, right IJ line, bilateral chest tubes in stable position. Impella device and cardiac pacer in stable position. Low lung volumes with bibasilar atelectasis/infiltrates. Similar findings on prior exam. No pleural effusion or pneumothorax. Surgical staples right chest. IMPRESSION: 1. Lines and tubes including bilateral chest tubes in stable position. Impella device and cardiac pacer in stable position. 2. Low lung volumes with bibasilar atelectasis/infiltrates. Similar findings on prior exam. 3.  Stable cardiomegaly. Electronically Signed   By: [place]   On: 02/01/2020 06:01     Medications:     Scheduled Medications: . vitamin C  500 mg Per Tube TID  . aspirin  324 mg Per Tube Daily  . atorvastatin  80 mg Per Tube Daily  . busPIRone  10 mg Per Tube TID  . chlorhexidine gluconate (MEDLINE KIT)  15 mL Mouth Rinse BID  . Chlorhexidine Gluconate Cloth  6 each Topical Daily  . [START ON 02/05/2020] darbepoetin (ARANESP) injection - NON-DIALYSIS  100 mcg Subcutaneous Q Sat-1800  . feeding supplement (PROSource TF)  45 mL Per Tube TID  . fiber  1 packet Per Tube BID  . FLUoxetine  40 mg Per Tube BID  . lidocaine  1 patch Transdermal Q24H  . mouth rinse  15 mL Mouth Rinse 10 times per day  . melatonin  5 mg Per Tube Daily  . midodrine  15 mg Per Tube TID WC  . multivitamin  1 tablet Per Tube QHS  . oxyCODONE  10 mg Per Tube Q8H  . pantoprazole sodium  40 mg Per Tube Daily  . QUEtiapine  50 mg Per  Tube BID  .  sodium chloride flush  10-40 mL Intracatheter Q12H  . sodium chloride flush  3 mL Intravenous Q12H  . thiamine  100 mg Per Tube Daily  . valproic acid  250 mg Per Tube BID    Infusions: .  prismasol BGK 4/2.5 500 mL/hr at 02/01/20 0558  .  prismasol BGK 4/2.5 200 mL/hr at 01/31/20 2014  . sodium chloride Stopped (01/29/20 0955)  . amiodarone 30 mg/hr (02/01/20 0700)  . epinephrine Stopped (02/01/20 0544)  . feeding supplement (PIVOT 1.5 CAL) 70 mL/hr at 01/31/20 1900  . fentaNYL infusion INTRAVENOUS 75 mcg/hr (02/01/20 0700)  . heparin 1,000 Units/hr (02/01/20 0700)  . impella catheter heparin 50 unit/mL in dextrose 5% 9 mL/hr at 01/21/20 0015  . insulin 1.5 mL/hr at 02/01/20 0700  . lactated ringers 20 mL/hr at 02/01/20 0700  . norepinephrine (LEVOPHED) Adult infusion 25 mcg/min (02/01/20 0700)  . prismasol BGK 4/2.5 1,500 mL/hr at 02/01/20 0500  . vasopressin Stopped (01/28/20 1136)    PRN Medications: sodium chloride, artificial tears, bisacodyl **OR** bisacodyl, dextrose, dextrose, docusate, fentaNYL, fentaNYL (SUBLIMAZE) injection, fentaNYL (SUBLIMAZE) injection, heparin, heparin, metoprolol tartrate, midazolam, ondansetron (ZOFRAN) IV, polyethylene glycol, sodium chloride flush, sodium chloride flush     Assessment/Plan   1. Cardiogenic shock/acute on chronic systolic CHF: Initially nonischemic cardiomyopathy. Possible familial cardiomyopathy as both parents had cardiomyopathy and died at around 21.However, [employer] gene testing did not show any common mutation for cardiomyopathy.  However, this admission noted to have severe 3 vessel disease so suspect component of ischemic cardiomyopathy. [employer] Jude ICD. Echo in 8/20 with EF 15% and mildly decreased RV function. Echo this admission with EF < 20%, moderate LV dilation, RV mildly reduced, severe LAE, no significant MR.Low output HF with markedly low EF.  He has a long history of cardiomyopathy (20 yrs), tends to  minimize symptoms.  Cardiorenal syndrome with creatinine up to 2.2,  stabilized on milrinone and Impella 5.5. SCr 1.44 day of CABG. CABG 12/15.  Post-op shock, back to OR 12/16 to open chest (chest wall compartment syndrome) and again to evacuate hematoma.  TEE post-op with severe RV dysfunction.  CVVHD initiated 12/19 for volume removal in setting of rising Scr/decreased UOP. Suspect ATN. S/p chest closure 12/23. Hemodynamics much worse on 12/24 with rising pressor demands. Co-ox 36%.  Bedside echo severe biventricular dysfunction with marked septal bounce. Chest re-opened 12/24.  Was atrial paced back to NSR on 12/26 with improvement in hemodynamics but back in atrial fibrillation on 12/27 and unable to pace out, DCCV to NSR on 12/28. Remains on Norepi 25.   - CVP 10, co-ox 74%.  - Continue to run CVVH even today.  -Hopefully chest closure this week. - Continue Impella to P8 to facilitate weaning of vasoactive meds.  LDH, platelets stable. On heparin gtt. - Continue NE.  Slow process with RV dysfunction.    - Continue midodrine 15 mg tid to try to wean pressors.  2. Atrial fibrillation: H/o PAF.  He was on dofetilide in the remote past but this was stopped due to noncompliance. He had an upper GI bleed from antral ulcers in 3/12. He was seen by GI and was deemed safe to restart anticoagulation as long as he remains on a PPI.  No apparent recurrence of AF until just prior to this admission, was cardioverted back to NSR in ER but went back into atrial fibrillation.  Post-op afib, DCCV to NSR/BiV pacing am 12/16 -> back to AF. Rapid atrial pacing back to  SR on 12/26 -> back in AF on 12/27 and unable to pace out. 12/28 DCCV to NSR.  Maintaining NSR.  - Continue IV amiodarone 30 mg/hr while on pressors.  - He is now back on heparin gtt.  - Did not have Maze with CABG.    3. CAD:   Cath this admission with severe 3 vessel disease. CABG x 4 on 12/15.  - Continue atorvastatin, ASA.  - continue current  management 4. Acute on chronic hypoxemic respiratory failure: Vent per CCM.  - CCM following - Will need tracheostomy when chest fully closed.  5. AKI on CKD stage 3: Likely ATN/cardiorenal.  On CVVH.  Run CVVH even today.   - CVP 10 today.  -  No urine output.  6. Diabetes: Insulin.  7. Hyponatremia: 132  8. Torsades/ICD shock: 12/7/212 in hospital event, in setting of severe hypokalemia and hypomagnesemia.   9. Gout: h/o gout. Complained of rt knee pain c/w previous flares - treated w/ prednisone burst (completed).  10: ID: Treating for PNA and also with chest wall open.   - Temp 100.4. Check blood cultures x2. Discussed with Dr [name].  - Restarting vancomycin and meropenum.  11. FEN: TFs ongoing.  Prealbumin 7 12. Anemia: Given 1u PRBCs 01/30/19 Hgb 7.5>8.1 >9.9  13. Stage II Pressure Ulcer- sacrum -Reposition every 2 hours R/L   Discussed with Dr [name] at bedside.   Amy Clegg NP-C  02/01/2020 7:36 AM  Patient seen with NP, agree with the above note.   Stable today, remains on epinephrine NE 25, off epinephrine.  He is in NSR on amiodarone gtt.  CVP 10, CVVH running even. Co-ox 69%. Impella stable P8. LDH stable.   Fever to 100.4, WBCs 12.   General: Awake on vent Neck: JVP 9-10, no thyromegaly or thyroid nodule.  Lungs: Decreased at bases CV: Chest wall open.  Heart regular S1/S2, no S3/S4, no murmur.  Trace edema. Abdomen: Soft, nontender, no hepatosplenomegaly, no distention.  Skin: Intact without lesions or rashes.  Neurologic: Follows commands.  Extremities: No clubbing or cyanosis.  HEENT: Normal.   I/Os event, CVP 10 today.  With fever, would aim to keep I/Os even (will not pull negative today).   Slow wean of NE as able.  Continue Impella P8.  Check position under echo today.   Will return to OR hopefully Wed or Thurs for chest wall closure.   Continue heparin gtt for AF and Impella.    With fever in setting of open chest, send blood/trach aspirate  cultures and will start vancomycin/meropenem.   CRITICAL CARE Performed by: Loralie Champagne  Total critical care time: 40 minutes  Critical care time was exclusive of separately billable procedures and treating other patients.  Critical care was necessary to treat or prevent imminent or life-threatening deterioration.  Critical care was time spent personally by me on the following activities: development of treatment plan with patient and/or surrogate as well as nursing, discussions with consultants, evaluation of patient's response to treatment, examination of patient, obtaining history from patient or surrogate, ordering and performing treatments and interventions, ordering and review of laboratory studies, ordering and review of radiographic studies, pulse oximetry and re-evaluation of patient's condition.  Loralie Champagne 02/01/2020 8:03 AM

## 2020-02-01 NOTE — Progress Notes (Signed)
NAME:  Corey Palmer, MRN:  [id number], DOB:  January 19, 1971, LOS: 28 ADMISSION DATE:  12/31/2019, CONSULTATION DATE: 01/05/2020 REFERRING MD: Aundra Dubin, CHIEF COMPLAINT: Respiratory failure post Impella  HPI/course in hospital  50 year old man admitted with A. fib/RVR, torsades, cardiogenic shock [EF 15] requiring Impella, CABG x5, AKI requiring initiation of CRRT  Past Medical History:    has a past medical history of Anginal pain ([place]), Anxiety, Arthritis, Automatic implantable cardioverter-defibrillator in situ (2007), Bipolar disorder ([place]), CAD (coronary artery disease), Cancer ([place]) (2013), CHF (congestive heart failure) ([place]), Chondrocalcinosis of right knee (05/06/8117), Chronic systolic heart failure ([place]), Depression, Diabetes uncomplicated adult-type II, Dilated cardiomyopathy ([place]), Dyslipidemia, Dysrhythmia, GERD (gastroesophageal reflux disease), Gout, unspecified, Hepatic steatosis (2011), History of kidney stones, echocardiogram, Hypertension, Obesity (BMI 30.0-34.9), Pacemaker, Paroxysmal atrial fibrillation ([place]), Peptic ulcer, Shortness of breath, and Sleep apnea.  Significant Hospital Events:  12/2 Afib with RVR. Cardioverted in ED. AICD did not fire. Milrinone for low co ox.  12/7 ICD fired, Torsades- likely from low K of 2.8.  12/8 Cath- multivessel disease w/ low CO. Volume overloaded.  12/11 Underwent Impella 5.5 insertion for persistent symptoms of forward failure with low cardiac indices. 12/12 Back to the OR for displacement of Impella. Extubated.  12/15 CABG x4, with LIMA-LAD, SVG-PDA/PLV, SVG-OM 12/16 Emergently opened chest at bedside for tamponade, clot compressing right atrium  12/16 To OR for Exploration of chest due to bleeding  12/19 Started CRRT 12/23 Sternal Closure in OR 12/24 Worsening shock.  Chest reopened at bedside with improvement in hemodynamics.  No mediastinal hematoma. 12/25 A flutter > rapid a paced to sinus rhythm.  Remains on high-dose pressors,  inotropes 12/28 Weaning vasopressor requirements.  For chest washout today.  Back in atrial fibrillation, refractory to increased amiodarone and attempted overdrive pacing.  DCCV to NSR. Tolerating fluid removal via CRRT. 1/3: Appears euvolemic.  Adjusting glycemic control.  Chest still open so not weaning Consults:  PCCM, nephrology, cardiology, cardiothoracic surgery  Procedures:  12/2 R PICC >> 12/11 right ax impella 5.5 >> 12/15 ETT >> 12/15 right femoral arterial sheath >> 12/19 right femoral HD >> 12/23 chest tube-right pleural, left pleural , mediastinal  >>  Significant Diagnostic Tests:    Micro Data:  Blood culture 12/14 > negative  Blood culture 12/16 > negative Respiratory culture 12/20 > rare candida albicans Sternal wound culture 12/23 > no growth Fungal cx sternal wound 12/23 >> neg Fungal cx w/stain sternal wound 12/23 >> AFB sternal wound 12/23 >>   Antimicrobials:  Vancomycin 12/16 >>12/31 Meropenem 12/16 >> 12/31  Interval history:  Awake, complains of some pain but quickly falls back asleep.  Objective   Blood pressure 94/76, pulse 75, temperature 98.1 F (36.7 C), temperature source Axillary, resp. rate (!) 28, height 6\' 3"  (1.905 m), weight 102.3 kg, SpO2 100 %. CVP:  [5 mmHg-16 mmHg] 13 mmHg  Vent Mode: PRVC FiO2 (%):  [30 %-40 %] 30 % Set Rate:  [28 bmp] 28 bmp Vt Set:  [510 mL] 510 mL PEEP:  [5 cmH20] 5 cmH20 Plateau Pressure:  [22 cmH20-24 cmH20] 23 cmH20   Intake/Output Summary (Last 24 hours) at 02/01/2020 1333 Last data filed at 02/01/2020 1300 Gross per 24 hour  Intake 4816.34 ml  Output 5189 ml  Net -372.66 ml   Filed Weights   01/30/20 0434 01/31/20 0448 02/01/20 0500  Weight: 103.7 kg 102.3 kg 102.3 kg   CVP:  [5 mmHg-16 mmHg] 13 mmHg  Examination:  General: critically ill  appearing man laying in bed in NAD, intubated, lightly sedated HEENT [place]/AT, eyes anicteric, ETT Cardiac: open chest with wound vac. RRR, no murmur.   Pulmonary CTAB, breathing synchronously with MV Abdomen soft, NT, ND Extremities + edema, no clubbing or cyanosis Neuro : RASS -2; nods to answer questions, follows commands   Assessment & Plan:   Acute hypoxic and hypercapnic respiratory failure requiring mechanical ventilation following cardiac surgery. -Remains on full ventilator support, not a candidate for weaning as of yet -Portable chest x-ray personally reviewed cardiomegaly: Endotracheal tube in satisfactory position.  Right IJ catheter in satisfactory position.  Bilateral interstitial prominence, with right basilar airspace disease perhaps a little improved -con't LTVV, 4-8cc/kg IBW with goal Pplat <30 and DP <15 -VAP prevention protocol -PAD protocol for sedation. Con't seroquel and valproic acid. -VAP prevention protocol -Anticipate he will need tracheostomy at the time of chest closure.  Circulatory shock due to  vasoplegic and cardiogenic shock following high risk CABG. Post cardiac surgery RV dysfunction CAD s/p CABG, c/b cardiac tamponade, hematoma evac. Chest closed with VAC.   -Maintain albumin volume status on CVVHD -Continue Impella with PEEP support -Continue milrinone, norepinephrine, heparin -epinephrine needed off -potentially chest closure tomorrow -Continue atorvastatin and aspirin  Paroxysmal Afib Currently normal sinus Plan -Continue IV amiodarone -Continue IV heparin -Continue telemetry monitoring  Acute kidney injury with anuria; likely cardiorenal syndrome Plan -Continue CRRT;: Net even volume status -Renally dose meds, avoid nephrotoxic meds -strict I/Os  DM2 with hyperglycemia: Improved control, but still high 1 febrile overnight -Continue insulin infusion.  If come back to the OR tomorrow tube feeds will be held. -Goal BG 140-180  Acute blood loss anemia Trend CBC; transfuse for hemoglobin less than 7 date  Fever -Reculture -Restart empiric antibiotics-meropenem and  vancomycin  Daily Goals Checklist  Pain/Anxiety/Delirium protocol (if indicated): fent gtt, PRN versed [facility] oxy VAP protocol (if indicated): bundle in place. Respiratory support goals: full ventilatory support. Wean once chest closed.  DVT prophylaxis:  Heparin Nutrition Status: high nutritional risk. EN per [place]. GI prophylaxis: pantoprazole Fluid status goals: CRRT to net even fluid Urinary catheter: n/a Central lines: PICC, PAC, HD catheter. Glucose control: insulin gtt Mobility/therapy needs: bed rest  Daily labs: CBC, renal function, cooximetry Code Status: full  Family Communication: wife updated 1/4 at bedside Disposition: ICU    This patient is critically ill with multiple organ system failure which requires frequent high complexity decision making, assessment, support, evaluation, and titration of therapies. This was completed through the application of advanced monitoring technologies and extensive interpretation of multiple databases. During this encounter critical care time was devoted to patient care services described in this note for 35 minutes.  Julian Hy, DO 02/01/20 1:43 PM  Pulmonary & Critical Care

## 2020-02-01 NOTE — Progress Notes (Signed)
Pharmacy Antibiotic Note  Corey Palmer is a 50 y.o. male admitted on 01/21/2020  S/pimpella placed then> CABG and Impella remains in placed - with open chest.  Empiric ABX stopped 12/31.  Now with concerns for possible sepsis.  New fever 100.4, WBC remain elevated 12, increased need for pressor support, continues to be on CRRT - run even.  Pharmacy has been re-consulted for vancomycin and meropenem dosing. Will draw Bcx and then initiate ABX .  Plan: Vancomycin 2gm x1 today then  1g IV every 24 hours starting tomorrow Resume meropenem 1 gm IV q8 hrs - Monitor  cultures, clinical progression  Check vancomycin levels as indicated - continue while chest remains open  Height: 6\' 3"  (190.5 cm) Weight: 102.3 kg (225 lb 8.5 oz) IBW/kg (Calculated) : 84.5  Temp (24hrs), Avg:99.3 F (37.4 C), Min:98.7 F (37.1 C), Max:100.4 F (38 C)  Recent Labs  Lab 01/27/20 1201 01/27/20 1631 01/28/20 0258 01/28/20 1719 01/29/20 0354 01/29/20 1546 01/30/20 0500 01/30/20 1555 01/31/20 0405 01/31/20 1549 02/01/20 0415  WBC  --   --  16.8*  --  15.1*  --  13.3*  --  12.5*  --  12.1*  CREATININE  --    < > 1.70*   < > 1.65*   < > 1.58* 1.50* 1.59* 1.59* 1.70*  VANCOTROUGH 20  --   --   --   --   --   --   --   --   --   --    < > = values in this interval not displayed.    Estimated Creatinine Clearance: 66.6 mL/min (A) (by C-G formula based on SCr of 1.7 mg/dL (H)).    Allergies  Allergen Reactions  . Orange Fruit Anaphylaxis, Hives and Other (See Comments)    Per Pt- Blisters around lips and Hives all over, also  . Penicillins Anaphylaxis    Did it involve swelling of the face/tongue/throat, SOB, or low BP? Yes Did it involve sudden or severe rash/hives, skin peeling, or any reaction on the inside of your mouth or nose? Yes Did you need to seek medical attention at a hospital or doctor's office? No When did it last happen?childhood If all above answers are "NO", may proceed with  cephalosporin use.  [name] [Empagliflozin] Itching  . [name] [Insulin Glargine] Nausea And Vomiting    Antimicrobials this admission: Vancomycin 12/15 >> 12/31 resume 1/4  Meropenem 12/16 >> 12/31 resume 1/4  Dose adjustments this admission: 12/18: VT 26 - decrease to 750 mg IV q12 hrs 12/19: starting CRRT - adjust Vanc to 1500 mg IV q24 hrs 12/21 VT 27 - decrease Vanc to 1250 q24hr 12/25 VT 31 - hold vancomycin for 12/26 12/27 VT 21 (after holding doses) - restart vanc 1g IV q24hr 12/27 VT 20 - continue  Microbiology results: 12/16 BCx: ngtd 12/14 BCx: ngtd  12/11 MRSA PCR: neg     Bonnita Nasuti Pharm.D. CPP, BCPS Clinical Pharmacist [phone] 02/01/2020 10:19 AM    Please check AMION for all [place] phone numbers After 10:00 PM, call [place] [phone]

## 2020-02-01 NOTE — Progress Notes (Deleted)
Assisted wife with tele visit. Pt responding appropriately.

## 2020-02-02 ENCOUNTER — Inpatient Hospital Stay (HOSPITAL_COMMUNITY): Payer: BC Managed Care – PPO | Admitting: Certified Registered"

## 2020-02-02 ENCOUNTER — Encounter (HOSPITAL_COMMUNITY): Payer: Self-pay | Admitting: Cardiothoracic Surgery

## 2020-02-02 ENCOUNTER — Inpatient Hospital Stay (HOSPITAL_COMMUNITY): Payer: BC Managed Care – PPO

## 2020-02-02 ENCOUNTER — Encounter (HOSPITAL_COMMUNITY): Admission: EM | Disposition: E | Payer: Self-pay | Source: Home / Self Care | Attending: Cardiothoracic Surgery

## 2020-02-02 DIAGNOSIS — N179 Acute kidney failure, unspecified: Secondary | ICD-10-CM | POA: Diagnosis not present

## 2020-02-02 DIAGNOSIS — I5021 Acute systolic (congestive) heart failure: Secondary | ICD-10-CM | POA: Diagnosis not present

## 2020-02-02 DIAGNOSIS — J9601 Acute respiratory failure with hypoxia: Secondary | ICD-10-CM

## 2020-02-02 DIAGNOSIS — I2510 Atherosclerotic heart disease of native coronary artery without angina pectoris: Secondary | ICD-10-CM

## 2020-02-02 DIAGNOSIS — J9600 Acute respiratory failure, unspecified whether with hypoxia or hypercapnia: Secondary | ICD-10-CM

## 2020-02-02 DIAGNOSIS — I4891 Unspecified atrial fibrillation: Secondary | ICD-10-CM | POA: Diagnosis not present

## 2020-02-02 DIAGNOSIS — D649 Anemia, unspecified: Secondary | ICD-10-CM | POA: Diagnosis not present

## 2020-02-02 DIAGNOSIS — R570 Cardiogenic shock: Secondary | ICD-10-CM | POA: Diagnosis not present

## 2020-02-02 DIAGNOSIS — Z95811 Presence of heart assist device: Secondary | ICD-10-CM | POA: Diagnosis not present

## 2020-02-02 DIAGNOSIS — Z9911 Dependence on respirator [ventilator] status: Secondary | ICD-10-CM | POA: Diagnosis not present

## 2020-02-02 HISTORY — PX: APPLICATION OF WOUND VAC: SHX5189

## 2020-02-02 HISTORY — PX: STERNAL CLOSURE: SHX6203

## 2020-02-02 HISTORY — PX: TEE WITHOUT CARDIOVERSION: SHX5443

## 2020-02-02 HISTORY — PX: STERNAL WOUND DEBRIDEMENT: SHX1058

## 2020-02-02 LAB — CBC WITH DIFFERENTIAL/PLATELET
Abs Immature Granulocytes: 0.2 10*3/uL — ABNORMAL HIGH (ref 0.00–0.07)
Basophils Absolute: 0.1 10*3/uL (ref 0.0–0.1)
Basophils Relative: 1 %
Eosinophils Absolute: 0.6 10*3/uL — ABNORMAL HIGH (ref 0.0–0.5)
Eosinophils Relative: 5 %
HCT: 26.8 % — ABNORMAL LOW (ref 39.0–52.0)
Hemoglobin: 7.9 g/dL — ABNORMAL LOW (ref 13.0–17.0)
Immature Granulocytes: 2 %
Lymphocytes Relative: 9 %
Lymphs Abs: 1 10*3/uL (ref 0.7–4.0)
MCH: 27.1 pg (ref 26.0–34.0)
MCHC: 29.5 g/dL — ABNORMAL LOW (ref 30.0–36.0)
MCV: 91.8 fL (ref 80.0–100.0)
Monocytes Absolute: 1.1 10*3/uL — ABNORMAL HIGH (ref 0.1–1.0)
Monocytes Relative: 10 %
Neutro Abs: 8.1 10*3/uL — ABNORMAL HIGH (ref 1.7–7.7)
Neutrophils Relative %: 73 %
Platelets: 352 10*3/uL (ref 150–400)
RBC: 2.92 MIL/uL — ABNORMAL LOW (ref 4.22–5.81)
RDW: 21.4 % — ABNORMAL HIGH (ref 11.5–15.5)
WBC: 11 10*3/uL — ABNORMAL HIGH (ref 4.0–10.5)
nRBC: 0 % (ref 0.0–0.2)

## 2020-02-02 LAB — COOXEMETRY PANEL
Carboxyhemoglobin: 1.9 % — ABNORMAL HIGH (ref 0.5–1.5)
Carboxyhemoglobin: 2 % — ABNORMAL HIGH (ref 0.5–1.5)
Methemoglobin: 0.7 % (ref 0.0–1.5)
Methemoglobin: 0.8 % (ref 0.0–1.5)
O2 Saturation: 60.8 %
O2 Saturation: 45.1 %
Total hemoglobin: 8.5 g/dL — ABNORMAL LOW (ref 12.0–16.0)
Total hemoglobin: 6.6 g/dL — CL (ref 12.0–16.0)

## 2020-02-02 LAB — PROTIME-INR
INR: 1.5 — ABNORMAL HIGH (ref 0.8–1.2)
INR: 1.5 — ABNORMAL HIGH (ref 0.8–1.2)
Prothrombin Time: 17.1 seconds — ABNORMAL HIGH (ref 11.4–15.2)
Prothrombin Time: 17.4 seconds — ABNORMAL HIGH (ref 11.4–15.2)

## 2020-02-02 LAB — POCT I-STAT 7, (LYTES, BLD GAS, ICA,H+H)
Acid-Base Excess: 0 mmol/L (ref 0.0–2.0)
Acid-Base Excess: 2 mmol/L (ref 0.0–2.0)
Acid-base deficit: 2 mmol/L (ref 0.0–2.0)
Bicarbonate: 25.2 mmol/L (ref 20.0–28.0)
Bicarbonate: 26.5 mmol/L (ref 20.0–28.0)
Bicarbonate: 23.4 mmol/L (ref 20.0–28.0)
Calcium, Ion: 1.22 mmol/L (ref 1.15–1.40)
Calcium, Ion: 1.17 mmol/L (ref 1.15–1.40)
Calcium, Ion: 1.2 mmol/L (ref 1.15–1.40)
HCT: 25 % — ABNORMAL LOW (ref 39.0–52.0)
HCT: 21 % — ABNORMAL LOW (ref 39.0–52.0)
HCT: 24 % — ABNORMAL LOW (ref 39.0–52.0)
Hemoglobin: 8.5 g/dL — ABNORMAL LOW (ref 13.0–17.0)
Hemoglobin: 7.1 g/dL — ABNORMAL LOW (ref 13.0–17.0)
Hemoglobin: 8.2 g/dL — ABNORMAL LOW (ref 13.0–17.0)
O2 Saturation: 96 %
O2 Saturation: 87 %
O2 Saturation: 85 %
Patient temperature: 98.1
Patient temperature: 36.6
Patient temperature: 97.7
Potassium: 4.5 mmol/L (ref 3.5–5.1)
Potassium: 4.1 mmol/L (ref 3.5–5.1)
Potassium: 4.3 mmol/L (ref 3.5–5.1)
Sodium: 136 mmol/L (ref 135–145)
Sodium: 138 mmol/L (ref 135–145)
Sodium: 135 mmol/L (ref 135–145)
TCO2: 26 mmol/L (ref 22–32)
TCO2: 28 mmol/L (ref 22–32)
TCO2: 25 mmol/L (ref 22–32)
pCO2 arterial: 39.7 mmHg (ref 32.0–48.0)
pCO2 arterial: 40.2 mmHg (ref 32.0–48.0)
pCO2 arterial: 41.9 mmHg (ref 32.0–48.0)
pH, Arterial: 7.409 (ref 7.350–7.450)
pH, Arterial: 7.425 (ref 7.350–7.450)
pH, Arterial: 7.354 (ref 7.350–7.450)
pO2, Arterial: 82 mmHg — ABNORMAL LOW (ref 83.0–108.0)
pO2, Arterial: 51 mmHg — ABNORMAL LOW (ref 83.0–108.0)
pO2, Arterial: 51 mmHg — ABNORMAL LOW (ref 83.0–108.0)

## 2020-02-02 LAB — GLUCOSE, CAPILLARY
Glucose-Capillary: 113 mg/dL — ABNORMAL HIGH (ref 70–99)
Glucose-Capillary: 115 mg/dL — ABNORMAL HIGH (ref 70–99)
Glucose-Capillary: 116 mg/dL — ABNORMAL HIGH (ref 70–99)
Glucose-Capillary: 128 mg/dL — ABNORMAL HIGH (ref 70–99)
Glucose-Capillary: 134 mg/dL — ABNORMAL HIGH (ref 70–99)
Glucose-Capillary: 135 mg/dL — ABNORMAL HIGH (ref 70–99)
Glucose-Capillary: 136 mg/dL — ABNORMAL HIGH (ref 70–99)
Glucose-Capillary: 136 mg/dL — ABNORMAL HIGH (ref 70–99)
Glucose-Capillary: 136 mg/dL — ABNORMAL HIGH (ref 70–99)
Glucose-Capillary: 138 mg/dL — ABNORMAL HIGH (ref 70–99)
Glucose-Capillary: 139 mg/dL — ABNORMAL HIGH (ref 70–99)
Glucose-Capillary: 140 mg/dL — ABNORMAL HIGH (ref 70–99)
Glucose-Capillary: 145 mg/dL — ABNORMAL HIGH (ref 70–99)
Glucose-Capillary: 145 mg/dL — ABNORMAL HIGH (ref 70–99)
Glucose-Capillary: 146 mg/dL — ABNORMAL HIGH (ref 70–99)
Glucose-Capillary: 147 mg/dL — ABNORMAL HIGH (ref 70–99)
Glucose-Capillary: 148 mg/dL — ABNORMAL HIGH (ref 70–99)
Glucose-Capillary: 149 mg/dL — ABNORMAL HIGH (ref 70–99)
Glucose-Capillary: 150 mg/dL — ABNORMAL HIGH (ref 70–99)
Glucose-Capillary: 161 mg/dL — ABNORMAL HIGH (ref 70–99)
Glucose-Capillary: 121 mg/dL — ABNORMAL HIGH (ref 70–99)
Glucose-Capillary: 139 mg/dL — ABNORMAL HIGH (ref 70–99)
Glucose-Capillary: 129 mg/dL — ABNORMAL HIGH (ref 70–99)
Glucose-Capillary: 128 mg/dL — ABNORMAL HIGH (ref 70–99)
Glucose-Capillary: 136 mg/dL — ABNORMAL HIGH (ref 70–99)
Glucose-Capillary: 153 mg/dL — ABNORMAL HIGH (ref 70–99)
Glucose-Capillary: 162 mg/dL — ABNORMAL HIGH (ref 70–99)
Glucose-Capillary: 150 mg/dL — ABNORMAL HIGH (ref 70–99)
Glucose-Capillary: 192 mg/dL — ABNORMAL HIGH (ref 70–99)
Glucose-Capillary: 201 mg/dL — ABNORMAL HIGH (ref 70–99)
Glucose-Capillary: 172 mg/dL — ABNORMAL HIGH (ref 70–99)
Glucose-Capillary: 168 mg/dL — ABNORMAL HIGH (ref 70–99)

## 2020-02-02 LAB — ECHOCARDIOGRAM LIMITED
Height: 75 in
Weight: 3590.85 oz

## 2020-02-02 LAB — CBC
HCT: 23.1 % — ABNORMAL LOW (ref 39.0–52.0)
HCT: 25.2 % — ABNORMAL LOW (ref 39.0–52.0)
HCT: 21.3 % — ABNORMAL LOW (ref 39.0–52.0)
Hemoglobin: 6.8 g/dL — CL (ref 13.0–17.0)
Hemoglobin: 8 g/dL — ABNORMAL LOW (ref 13.0–17.0)
Hemoglobin: 6.7 g/dL — CL (ref 13.0–17.0)
MCH: 27.4 pg (ref 26.0–34.0)
MCH: 29.5 pg (ref 26.0–34.0)
MCH: 29.1 pg (ref 26.0–34.0)
MCHC: 29.4 g/dL — ABNORMAL LOW (ref 30.0–36.0)
MCHC: 31.7 g/dL (ref 30.0–36.0)
MCHC: 31.5 g/dL (ref 30.0–36.0)
MCV: 93.1 fL (ref 80.0–100.0)
MCV: 93 fL (ref 80.0–100.0)
MCV: 92.6 fL (ref 80.0–100.0)
Platelets: 489 10*3/uL — ABNORMAL HIGH (ref 150–400)
Platelets: 402 10*3/uL — ABNORMAL HIGH (ref 150–400)
Platelets: 319 10*3/uL (ref 150–400)
RBC: 2.48 MIL/uL — ABNORMAL LOW (ref 4.22–5.81)
RBC: 2.71 MIL/uL — ABNORMAL LOW (ref 4.22–5.81)
RBC: 2.3 MIL/uL — ABNORMAL LOW (ref 4.22–5.81)
RDW: 21.2 % — ABNORMAL HIGH (ref 11.5–15.5)
RDW: 19.1 % — ABNORMAL HIGH (ref 11.5–15.5)
RDW: 18.7 % — ABNORMAL HIGH (ref 11.5–15.5)
WBC: 17.8 10*3/uL — ABNORMAL HIGH (ref 4.0–10.5)
WBC: 26.5 10*3/uL — ABNORMAL HIGH (ref 4.0–10.5)
WBC: 24.9 10*3/uL — ABNORMAL HIGH (ref 4.0–10.5)
nRBC: 0.2 % (ref 0.0–0.2)
nRBC: 0.5 % — ABNORMAL HIGH (ref 0.0–0.2)
nRBC: 0.3 % — ABNORMAL HIGH (ref 0.0–0.2)

## 2020-02-02 LAB — RENAL FUNCTION PANEL
Albumin: 1.8 g/dL — ABNORMAL LOW (ref 3.5–5.0)
Albumin: 1.7 g/dL — ABNORMAL LOW (ref 3.5–5.0)
Anion gap: 9 (ref 5–15)
Anion gap: 8 (ref 5–15)
BUN: 44 mg/dL — ABNORMAL HIGH (ref 6–20)
BUN: 39 mg/dL — ABNORMAL HIGH (ref 6–20)
CO2: 24 mmol/L (ref 22–32)
CO2: 23 mmol/L (ref 22–32)
Calcium: 8.4 mg/dL — ABNORMAL LOW (ref 8.9–10.3)
Calcium: 8.1 mg/dL — ABNORMAL LOW (ref 8.9–10.3)
Chloride: 99 mmol/L (ref 98–111)
Chloride: 102 mmol/L (ref 98–111)
Creatinine, Ser: 1.46 mg/dL — ABNORMAL HIGH (ref 0.61–1.24)
Creatinine, Ser: 1.65 mg/dL — ABNORMAL HIGH (ref 0.61–1.24)
GFR, Estimated: 58 mL/min — ABNORMAL LOW (ref >60)
GFR, Estimated: 50 mL/min — ABNORMAL LOW (ref >60)
Glucose, Bld: 116 mg/dL — ABNORMAL HIGH (ref 70–99)
Glucose, Bld: 203 mg/dL — ABNORMAL HIGH (ref 70–99)
Phosphorus: 2 mg/dL — ABNORMAL LOW (ref 2.5–4.6)
Phosphorus: 2.8 mg/dL (ref 2.5–4.6)
Potassium: 4.5 mmol/L (ref 3.5–5.1)
Potassium: 4.2 mmol/L (ref 3.5–5.1)
Sodium: 132 mmol/L — ABNORMAL LOW (ref 135–145)
Sodium: 133 mmol/L — ABNORMAL LOW (ref 135–145)

## 2020-02-02 LAB — ECHO INTRAOPERATIVE TEE
Height: 75 in
Weight: 3590.85 oz

## 2020-02-02 LAB — BASIC METABOLIC PANEL
Anion gap: 10 (ref 5–15)
BUN: 36 mg/dL — ABNORMAL HIGH (ref 6–20)
CO2: 21 mmol/L — ABNORMAL LOW (ref 22–32)
Calcium: 8.3 mg/dL — ABNORMAL LOW (ref 8.9–10.3)
Chloride: 102 mmol/L (ref 98–111)
Creatinine, Ser: 1.55 mg/dL — ABNORMAL HIGH (ref 0.61–1.24)
GFR, Estimated: 54 mL/min — ABNORMAL LOW (ref >60)
Glucose, Bld: 185 mg/dL — ABNORMAL HIGH (ref 70–99)
Potassium: 4.3 mmol/L (ref 3.5–5.1)
Sodium: 133 mmol/L — ABNORMAL LOW (ref 135–145)

## 2020-02-02 LAB — SURGICAL PCR SCREEN
MRSA, PCR: NEGATIVE
Staphylococcus aureus: NEGATIVE

## 2020-02-02 LAB — FIBRINOGEN
Fibrinogen: 573 mg/dL — ABNORMAL HIGH (ref 210–475)
Fibrinogen: 496 mg/dL — ABNORMAL HIGH (ref 210–475)

## 2020-02-02 LAB — PREPARE RBC (CROSSMATCH)

## 2020-02-02 LAB — LACTATE DEHYDROGENASE: LDH: 381 U/L — ABNORMAL HIGH (ref 98–192)

## 2020-02-02 LAB — HEPARIN LEVEL (UNFRACTIONATED): Heparin Unfractionated: <0.1 IU/mL — ABNORMAL LOW (ref 0.30–0.70)

## 2020-02-02 LAB — MAGNESIUM: Magnesium: 2.4 mg/dL (ref 1.7–2.4)

## 2020-02-02 SURGERY — DEBRIDEMENT, WOUND, STERNUM
Anesthesia: General | Site: Chest

## 2020-02-02 MED ORDER — SODIUM CHLORIDE 0.9% IV SOLUTION: Freq: Once | INTRAVENOUS | Status: AC

## 2020-02-02 MED ORDER — FENTANYL 2500MCG IN NS 250ML (10MCG/ML) PREMIX INFUSION
50.0000 ug/h | INTRAVENOUS | Status: DC
  Administered 2020-02-02: 175 ug/h via INTRAVENOUS
  Administered 2020-02-03: 275 ug/h via INTRAVENOUS
  Administered 2020-02-03: 225 ug/h via INTRAVENOUS
  Administered 2020-02-03: 250 ug/h via INTRAVENOUS
  Administered 2020-02-04 – 2020-02-05 (×3): 175 ug/h via INTRAVENOUS
  Administered 2020-02-06: 300 ug/h via INTRAVENOUS
  Administered 2020-02-06: 250 ug/h via INTRAVENOUS
  Administered 2020-02-06: 175 ug/h via INTRAVENOUS
  Administered 2020-02-07: 300 ug/h via INTRAVENOUS
  Administered 2020-02-07: 100 ug/h via INTRAVENOUS
  Administered 2020-02-08: 150 ug/h via INTRAVENOUS
  Administered 2020-02-09: 225 ug/h via INTRAVENOUS
  Filled 2020-02-02 (×16): qty 250

## 2020-02-02 MED ORDER — MILRINONE LACTATE IN DEXTROSE 20-5 MG/100ML-% IV SOLN
0.1250 ug/kg/min | INTRAVENOUS | Status: DC
  Administered 2020-02-02: 23:00:00 0.125 ug/kg/min via INTRAVENOUS
  Administered 2020-02-03 – 2020-02-05 (×6): 0.25 ug/kg/min via INTRAVENOUS
  Administered 2020-02-06 – 2020-02-12 (×5): 0.125 ug/kg/min via INTRAVENOUS
  Filled 2020-02-02 (×13): qty 100

## 2020-02-02 MED ORDER — FENTANYL BOLUS VIA INFUSION
50.0000 ug | INTRAVENOUS | Status: DC | PRN
  Administered 2020-02-05 – 2020-02-09 (×14): 50 ug via INTRAVENOUS
  Filled 2020-02-02: qty 50

## 2020-02-02 MED ORDER — MIDAZOLAM BOLUS VIA INFUSION
1.0000 mg | INTRAVENOUS | Status: DC | PRN
  Filled 2020-02-02: qty 2

## 2020-02-02 MED ORDER — SODIUM CHLORIDE 0.9 % IV SOLN
0.0000 ug/kg/min | INTRAVENOUS | Status: DC
  Administered 2020-02-02: 3 ug/kg/min via INTRAVENOUS
  Administered 2020-02-03: 3.5 ug/kg/min via INTRAVENOUS
  Filled 2020-02-02 (×4): qty 20

## 2020-02-02 MED ORDER — FENTANYL CITRATE (PF) 250 MCG/5ML IJ SOLN
INTRAMUSCULAR | Status: AC
  Filled 2020-02-02: qty 5

## 2020-02-02 MED ORDER — FENTANYL CITRATE (PF) 100 MCG/2ML IJ SOLN: 50.0000 ug | Freq: Once | INTRAMUSCULAR | Status: AC

## 2020-02-02 MED ORDER — MILRINONE LACTATE IN DEXTROSE 20-5 MG/100ML-% IV SOLN: 0.1250 ug/kg/min | INTRAVENOUS | Status: DC

## 2020-02-02 MED ORDER — DEXTROSE 5 % SOLN FOR IMPELLA PURGE CATHETER
INTRAVENOUS | Status: DC
  Administered 2020-02-02: 1000 mL
  Filled 2020-02-02 (×3): qty 1000

## 2020-02-02 MED ORDER — SILDENAFIL CITRATE 20 MG PO TABS: 20.0000 mg | ORAL_TABLET | Freq: Three times a day (TID) | ORAL | Status: DC

## 2020-02-02 MED ORDER — SODIUM CHLORIDE 0.9 % IV SOLN: INTRAVENOUS | Status: DC | PRN

## 2020-02-02 MED ORDER — FENTANYL CITRATE (PF) 100 MCG/2ML IJ SOLN
50.0000 ug | Freq: Once | INTRAMUSCULAR | Status: DC
Start: 2020-02-02 — End: 2020-02-02

## 2020-02-02 MED ORDER — FENTANYL BOLUS VIA INFUSION
50.0000 ug | INTRAVENOUS | Status: DC | PRN
  Filled 2020-02-02: qty 50

## 2020-02-02 MED ORDER — DOCUSATE SODIUM 50 MG/5ML PO LIQD
100.0000 mg | Freq: Two times a day (BID) | ORAL | Status: DC
  Administered 2020-02-03 – 2020-02-21 (×29): 100 mg
  Filled 2020-02-02 (×26): qty 10

## 2020-02-02 MED ORDER — MIDAZOLAM HCL 5 MG/5ML IJ SOLN
INTRAMUSCULAR | Status: DC | PRN
  Administered 2020-02-02: 2 mg via INTRAVENOUS

## 2020-02-02 MED ORDER — SILDENAFIL 2.5 MG/ML ORAL SUSPENSION: 20.0000 mg | Freq: Three times a day (TID) | ORAL | Status: DC

## 2020-02-02 MED ORDER — ROCURONIUM BROMIDE 10 MG/ML (PF) SYRINGE
PREFILLED_SYRINGE | INTRAVENOUS | Status: AC
  Filled 2020-02-02: qty 10

## 2020-02-02 MED ORDER — ARTIFICIAL TEARS OPHTHALMIC OINT
1.0000 "application " | TOPICAL_OINTMENT | Freq: Three times a day (TID) | OPHTHALMIC | Status: DC
  Administered 2020-02-02 – 2020-02-04 (×4): 1 via OPHTHALMIC

## 2020-02-02 MED ORDER — MIDAZOLAM 50MG/50ML (1MG/ML) PREMIX INFUSION: 0.0000 mg/h | INTRAVENOUS | Status: DC

## 2020-02-02 MED ORDER — MILRINONE LACTATE IN DEXTROSE 20-5 MG/100ML-% IV SOLN
INTRAVENOUS | Status: DC | PRN
  Administered 2020-02-02: .25 ug/kg/min via INTRAVENOUS

## 2020-02-02 MED ORDER — MIDAZOLAM 50MG/50ML (1MG/ML) PREMIX INFUSION
2.0000 mg/h | INTRAVENOUS | Status: DC
  Administered 2020-02-02: 2 mg/h via INTRAVENOUS
  Administered 2020-02-02 – 2020-02-03 (×2): 6 mg/h via INTRAVENOUS
  Administered 2020-02-03: 4 mg/h via INTRAVENOUS
  Administered 2020-02-04: 2.5 mg/h via INTRAVENOUS
  Administered 2020-02-07 (×2): 2 mg/h via INTRAVENOUS
  Administered 2020-02-08: 3 mg/h via INTRAVENOUS
  Filled 2020-02-02 (×7): qty 50

## 2020-02-02 MED ORDER — ALBUMIN HUMAN 5 % IV SOLN
INTRAVENOUS | Status: AC
  Administered 2020-02-02: 12.5 g
  Filled 2020-02-02: qty 250

## 2020-02-02 MED ORDER — PLASMA-LYTE A IV SOLN: INTRAVENOUS | Status: DC

## 2020-02-02 MED ORDER — FENTANYL 2500MCG IN NS 250ML (10MCG/ML) PREMIX INFUSION: 50.0000 ug/h | INTRAVENOUS | Status: DC

## 2020-02-02 MED ORDER — ROCURONIUM BROMIDE 10 MG/ML (PF) SYRINGE
PREFILLED_SYRINGE | INTRAVENOUS | Status: DC | PRN
  Administered 2020-02-02: 50 mg via INTRAVENOUS

## 2020-02-02 MED ORDER — SUGAMMADEX SODIUM 200 MG/2ML IV SOLN
INTRAVENOUS | Status: DC | PRN
  Administered 2020-02-02: 200 mg via INTRAVENOUS

## 2020-02-02 MED ORDER — MILRINONE LACTATE IN DEXTROSE 20-5 MG/100ML-% IV SOLN
0.1250 ug/kg/min | INTRAVENOUS | Status: DC
  Filled 2020-02-02: qty 100

## 2020-02-02 MED ORDER — MIDAZOLAM HCL 2 MG/2ML IJ SOLN
INTRAMUSCULAR | Status: AC
  Filled 2020-02-02: qty 2

## 2020-02-02 MED ORDER — POLYETHYLENE GLYCOL 3350 17 G PO PACK
17.0000 g | PACK | Freq: Every day | ORAL | Status: DC
  Administered 2020-02-03 – 2020-02-20 (×16): 17 g
  Filled 2020-02-02 (×17): qty 1

## 2020-02-02 MED ORDER — CISATRACURIUM BOLUS VIA INFUSION
2.5000 mg | Freq: Once | INTRAVENOUS | Status: AC
  Administered 2020-02-02: 2.5 mg via INTRAVENOUS
  Filled 2020-02-02: qty 3

## 2020-02-02 MED ORDER — FENTANYL CITRATE (PF) 250 MCG/5ML IJ SOLN
INTRAMUSCULAR | Status: DC | PRN
  Administered 2020-02-02: 50 ug via INTRAVENOUS

## 2020-02-02 MED ORDER — SODIUM CHLORIDE 0.9 % IR SOLN
Status: DC | PRN
  Administered 2020-02-02: 1000 mL

## 2020-02-02 SURGICAL SUPPLY — 72 items
ANCHOR CATH FOLEY SECURE (MISCELLANEOUS) ×9 IMPLANT
APL SKNCLS STERI-STRIP NONHPOA (GAUZE/BANDAGES/DRESSINGS) ×2
BAG DECANTER FOR FLEXI CONT (MISCELLANEOUS) IMPLANT
BANDAGE ESMARK 6X9 LF (GAUZE/BANDAGES/DRESSINGS) IMPLANT
BENZOIN TINCTURE PRP APPL 2/3 (GAUZE/BANDAGES/DRESSINGS) ×3 IMPLANT
BLADE CLIPPER SURG (BLADE) IMPLANT
BLADE SURG 10 STRL SS (BLADE) IMPLANT
BNDG CMPR 9X6 STRL LF SNTH (GAUZE/BANDAGES/DRESSINGS)
BNDG ESMARK 6X9 LF (GAUZE/BANDAGES/DRESSINGS)
BNDG GAUZE ELAST 4 BULKY (GAUZE/BANDAGES/DRESSINGS) IMPLANT
BRUSH SCRUB EZ PLAIN DRY (MISCELLANEOUS) ×24 IMPLANT
CANISTER SUCT 3000ML PPV (MISCELLANEOUS) ×3 IMPLANT
CANISTER WOUND CARE 500ML ATS (WOUND CARE) ×3 IMPLANT
CATH FOLEY 2WAY SLVR  5CC 16FR (CATHETERS)
CATH FOLEY 2WAY SLVR 5CC 16FR (CATHETERS) IMPLANT
CLIP VESOCCLUDE SM WIDE 24/CT (CLIP) IMPLANT
CNTNR URN SCR LID CUP LEK RST (MISCELLANEOUS) IMPLANT
CONT SPEC 4OZ STRL OR WHT (MISCELLANEOUS)
COUNTER NEEDLE 20 DBL MAG RED (NEEDLE) ×3 IMPLANT
COVER SURGICAL LIGHT HANDLE (MISCELLANEOUS) ×3 IMPLANT
DRAPE INCISE IOBAN 66X45 STRL (DRAPES) IMPLANT
DRAPE LAPAROSCOPIC ABDOMINAL (DRAPES) ×3 IMPLANT
DRAPE SLUSH MACHINE 52X66 (DRAPES) ×3 IMPLANT
DRAPE WARM FLUID 44X44 (DRAPES) IMPLANT
DRSG AQUACEL AG ADV 3.5X14 (GAUZE/BANDAGES/DRESSINGS) IMPLANT
DRSG MEPILEX BORDER 4X4 (GAUZE/BANDAGES/DRESSINGS) ×3 IMPLANT
DRSG VAC ATS LRG SENSATRAC (GAUZE/BANDAGES/DRESSINGS) IMPLANT
DRSG VAC ATS MED SENSATRAC (GAUZE/BANDAGES/DRESSINGS) ×3 IMPLANT
DRSG VAC ATS SM SENSATRAC (GAUZE/BANDAGES/DRESSINGS) IMPLANT
ELECT BLADE 4.0 EZ CLEAN MEGAD (MISCELLANEOUS) ×3
ELECT REM PT RETURN 9FT ADLT (ELECTROSURGICAL) ×3
ELECTRODE BLDE 4.0 EZ CLN MEGD (MISCELLANEOUS) ×2 IMPLANT
ELECTRODE REM PT RTRN 9FT ADLT (ELECTROSURGICAL) ×2 IMPLANT
GAUZE SPONGE 4X4 12PLY STRL (GAUZE/BANDAGES/DRESSINGS) ×3 IMPLANT
GAUZE SPONGE 4X4 12PLY STRL LF (GAUZE/BANDAGES/DRESSINGS) ×3 IMPLANT
GAUZE XEROFORM 5X9 LF (GAUZE/BANDAGES/DRESSINGS) IMPLANT
GLOVE BIO SURGEON STRL SZ8 (GLOVE) ×3 IMPLANT
GLOVE NEODERM STRL 7.5  LF PF (GLOVE) ×2
GLOVE NEODERM STRL 7.5 LF PF (GLOVE) ×2 IMPLANT
GLOVE SURG NEODERM 7.5  LF PF (GLOVE) ×1
GLOVE SURG SS PI 6.0 STRL IVOR (GLOVE) ×3 IMPLANT
GLOVE SURG UNDER POLY LF SZ6 (GLOVE) ×3 IMPLANT
GOWN STRL REUS W/ TWL LRG LVL3 (GOWN DISPOSABLE) ×2 IMPLANT
GOWN STRL REUS W/ TWL XL LVL3 (GOWN DISPOSABLE) ×4 IMPLANT
GOWN STRL REUS W/TWL LRG LVL3 (GOWN DISPOSABLE) ×3
GOWN STRL REUS W/TWL XL LVL3 (GOWN DISPOSABLE) ×6
HANDPIECE INTERPULSE COAX TIP (DISPOSABLE) ×3
HEMOSTAT POWDER SURGIFOAM 1G (HEMOSTASIS) IMPLANT
HEMOSTAT SURGICEL 2X14 (HEMOSTASIS) IMPLANT
KIT BASIN OR (CUSTOM PROCEDURE TRAY) IMPLANT
KIT TURNOVER KIT B (KITS) ×3 IMPLANT
MARKER PEN SURG W/LABELS BLK (STERILIZATION PRODUCTS) ×3 IMPLANT
NS IRRIG 1000ML POUR BTL (IV SOLUTION) ×6 IMPLANT
PACK GENERAL/GYN (CUSTOM PROCEDURE TRAY) ×3 IMPLANT
PAD ARMBOARD 7.5X6 YLW CONV (MISCELLANEOUS) ×6 IMPLANT
PUTTY DBM STAGRAFT PLUS 10CC (Putty) ×3 IMPLANT
SET HNDPC FAN SPRY TIP SCT (DISPOSABLE) ×2 IMPLANT
SOL PREP POV-IOD 4OZ 10% (MISCELLANEOUS) ×3 IMPLANT
SPONGE LAP 18X18 RF (DISPOSABLE) ×3 IMPLANT
STAPLER VISISTAT 35W (STAPLE) IMPLANT
SUT ETHILON 3 0 FSL (SUTURE) IMPLANT
SUT PDS AB 1 CTX 36 (SUTURE) IMPLANT
SUT PROLENE 2 0 MH 48 (SUTURE) IMPLANT
SUT VIC AB 2-0 CTX 27 (SUTURE) IMPLANT
SWAB COLLECTION DEVICE MRSA (MISCELLANEOUS) IMPLANT
SWAB CULTURE ESWAB REG 1ML (MISCELLANEOUS) IMPLANT
SYR 5ML LL (SYRINGE) IMPLANT
TAPE CLOTH SURG 4X10 WHT LF (GAUZE/BANDAGES/DRESSINGS) ×3 IMPLANT
TAPE PAPER 2X10 WHT MICROPORE (GAUZE/BANDAGES/DRESSINGS) ×3 IMPLANT
TOWEL GREEN STERILE (TOWEL DISPOSABLE) ×3 IMPLANT
TOWEL GREEN STERILE FF (TOWEL DISPOSABLE) IMPLANT
WATER STERILE IRR 1000ML POUR (IV SOLUTION) IMPLANT

## 2020-02-02 NOTE — Progress Notes (Addendum)
Patient ID: Corey Palmer, male   DOB: [birth year], 50 y.o.   MRN: [id number]     Advanced Heart Failure Rounding Note  PCP-Cardiologist: No primary care provider on file.   Subjective:    - 12/7 Torsades -ICD shock x1.   - 12/11 Impella 5.5 placed.  - 12/12 Impella repositioned in OR - 12/15 CABG x 4 with LIMA-LAD, SVG-PDA/PLV, SVG-OM - 12/16 DCCV afib.  Back to OR to re-open chest and again to evacuate hematoma.  - 12/19 CVVH initiated  - 12/23 Chest closed - 12/24 Chest reopened - 12/26 RAP back into NSR - 12/27 back in atrial fibrillation, unable to rapid atrial pace out.   - 12/28 To OR, chest partially closed and wound vac placed.  DCCV to NSR.  - 12/31 Antibiotics stopped. Swan removed, HD catheter moved from right femoral to right IJ.   On NE 15. Off epi. Impella at P8.     CO-OX 69%.   CVVH at 50 per hour. Discussed with Dr Hollie Salk.   Bladder scan with 110cc  In NSR 80s   On amiodarone gtt 30 mg/hr.   Sedated on vent.   Impella 5.5 P-8  Flow 4.3 Heparin systemically Waveforms ok. No alarms LDH 381    Objective:   Weight Range: 101.8 kg Body mass index is 28.05 kg/m.   Vital Signs:   Temp:  [98.1 F (36.7 C)-98.4 F (36.9 C)] 98.1 F (36.7 C) (01/05 0815) Pulse Rate:  [64-93] 87 (01/05 0815) Resp:  [19-29] 28 (01/05 0815) BP: (60-115)/(40-78) 88/70 (01/05 0815) SpO2:  [93 %-100 %] 100 % (01/05 0815) FiO2 (%):  [30 %] 30 % (01/05 0800) Weight:  [101.8 kg] 101.8 kg (01/05 0600) Last BM Date: 02/16/2020  Weight change: Filed Weights   01/31/20 0448 02/01/20 0500 02/12/2020 0600  Weight: 102.3 kg 102.3 kg 101.8 kg    Intake/Output:   Intake/Output Summary (Last 24 hours) at 02/12/2020 0925 Last data filed at 02/11/2020 0900 Gross per 24 hour  Intake 4414.44 ml  Output 4599 ml  Net -184.56 ml      Physical Exam  CVP 7   General:  Sedated/vent  HEENT: ETT Neck: supple. no JVD. Carotids 2+ bilat; no bruits. No lymphadenopathy or thryomegaly  appreciated. Cor: PMI nondisplaced. Regular rate & rhythm. No rubs, gallops or murmurs. VAC dressing in place  Lungs: Coarse throughout  Abdomen: soft, nontender, nondistended. No hepatosplenomegaly. No bruits or masses. Good bowel sounds. Extremities: no cyanosis, clubbing, rash, R and LLE 1+ edema Neuro: Intubated/sedated  Telemetry  NSR with PVCs 80s    Labs    CBC Recent Labs    02/01/20 0415 02/01/20 0422 02/26/2020 0407 02/09/2020 0632  WBC 12.1*  --  11.0*  --   NEUTROABS 9.2*  --  8.1*  --   HGB 8.2*   < > 7.9* 8.5*  HCT 26.5*   < > 26.8* 25.0*  MCV 92.3  --  91.8  --   PLT 373  --  352  --    < > = values in this interval not displayed.   Basic Metabolic Panel Recent Labs    02/01/20 0415 02/01/20 0422 02/01/20 1759 01/31/2020 0407 01/30/2020 0632  NA 133*   < > 132* 132* 136  K 4.9   < > 4.7 4.5 4.5  CL 100  --  102 99  --   CO2 23  --  23 24  --   GLUCOSE 262*  --  142* 116*  --   BUN 44*  --  43* 44*  --   CREATININE 1.70*  --  1.54* 1.46*  --   CALCIUM 8.0*  --  8.2* 8.4*  --   MG 2.3  --   --  2.4  --   PHOS 2.5  --  2.4* 2.0*  --    < > = values in this interval not displayed.   Liver Function Tests Recent Labs    02/01/20 0415 02/01/20 1759 02/15/2020 0407  AST 62*  63*  --   --   ALT 46*  46*  --   --   ALKPHOS 229*  228*  --   --   BILITOT 0.7  1.0  --   --   PROT 6.1*  5.8*  --   --   ALBUMIN 2.0*  1.9* 1.9* 1.8*   No results for input(s): LIPASE, AMYLASE in the last 72 hours. Cardiac Enzymes No results for input(s): CKTOTAL, CKMB, CKMBINDEX, TROPONINI in the last 72 hours.  BNP: BNP (last 3 results) Recent Labs    01/07/2020 1619  BNP 577.5*    ProBNP (last 3 results) No results for input(s): PROBNP in the last 8760 hours.   D-Dimer No results for input(s): DDIMER in the last 72 hours. Hemoglobin A1C No results for input(s): HGBA1C in the last 72 hours. Fasting Lipid Panel No results for input(s): CHOL, HDL, LDLCALC,  TRIG, CHOLHDL, LDLDIRECT in the last 72 hours. Thyroid Function Tests No results for input(s): TSH, T4TOTAL, T3FREE, THYROIDAB in the last 72 hours.  Invalid input(s): FREET3  Other results:   Imaging    No results found.   Medications:     Scheduled Medications: . vitamin C  500 mg Per Tube TID  . aspirin  324 mg Per Tube Daily  . atorvastatin  80 mg Per Tube Daily  . busPIRone  10 mg Per Tube TID  . chlorhexidine gluconate (MEDLINE KIT)  15 mL Mouth Rinse BID  . Chlorhexidine Gluconate Cloth  6 each Topical Daily  . [START ON 02/05/2020] darbepoetin (ARANESP) injection - NON-DIALYSIS  100 mcg Subcutaneous Q Sat-1800  . feeding supplement (PROSource TF)  45 mL Per Tube TID  . fiber  1 packet Per Tube BID  . FLUoxetine  40 mg Per Tube BID  . lidocaine  1 patch Transdermal Q24H  . mouth rinse  15 mL Mouth Rinse 10 times per day  . melatonin  5 mg Per Tube Daily  . midodrine  15 mg Per Tube TID WC  . multivitamin  1 tablet Per Tube QHS  . oxyCODONE  10 mg Per Tube Q8H  . pantoprazole sodium  40 mg Per Tube Daily  . QUEtiapine  50 mg Per Tube BID  . sodium chloride flush  10-40 mL Intracatheter Q12H  . sodium chloride flush  3 mL Intravenous Q12H  . thiamine  100 mg Per Tube Daily  . valproic acid  250 mg Per Tube BID    Infusions: .  prismasol BGK 4/2.5 500 mL/hr at 02/25/2020 0128  .  prismasol BGK 4/2.5 200 mL/hr at 02/18/2020 0021  . sodium chloride Stopped (01/29/20 0955)  . amiodarone 30 mg/hr (02/25/2020 0900)  . epinephrine Stopped (02/01/20 0544)  . feeding supplement (PIVOT 1.5 CAL) Stopped (02/11/2020 0004)  . fentaNYL infusion INTRAVENOUS 150 mcg/hr (02/18/2020 0900)  . heparin 1,000 Units/hr (01/30/2020 0900)  . impella catheter heparin 50 unit/mL in dextrose 5% 9 mL/hr  at 01/21/20 0015  . insulin 0.9 mL/hr at 02/23/2020 0900  . lactated ringers 20 mL/hr at 02/17/2020 0900  . meropenem (MERREM) IV Stopped (02/12/2020 0548)  . norepinephrine (LEVOPHED) Adult infusion 14  mcg/min (02/12/2020 0900)  . prismasol BGK 4/2.5 1,500 mL/hr at 02/25/2020 3716  . vancomycin    . vasopressin Stopped (01/28/20 1136)    PRN Medications: sodium chloride, artificial tears, bisacodyl **OR** bisacodyl, dextrose, dextrose, docusate, fentaNYL, fentaNYL (SUBLIMAZE) injection, fentaNYL (SUBLIMAZE) injection, heparin, heparin, metoprolol tartrate, midazolam, ondansetron (ZOFRAN) IV, polyethylene glycol, sodium chloride flush, sodium chloride flush     Assessment/Plan   1. Cardiogenic shock/acute on chronic systolic CHF: Initially nonischemic cardiomyopathy. Possible familial cardiomyopathy as both parents had cardiomyopathy and died at around 56.However, [employer] gene testing did not show any common mutation for cardiomyopathy.  However, this admission noted to have severe 3 vessel disease so suspect component of ischemic cardiomyopathy. [employer] Jude ICD. Echo in 8/20 with EF 15% and mildly decreased RV function. Echo this admission with EF < 20%, moderate LV dilation, RV mildly reduced, severe LAE, no significant MR.Low output HF with markedly low EF.  He has a long history of cardiomyopathy (20 yrs), tends to minimize symptoms.  Cardiorenal syndrome with creatinine up to 2.2,  stabilized on milrinone and Impella 5.5. SCr 1.44 day of CABG. CABG 12/15.  Post-op shock, back to OR 12/16 to open chest (chest wall compartment syndrome) and again to evacuate hematoma.  TEE post-op with severe RV dysfunction.  CVVHD initiated 12/19 for volume removal in setting of rising Scr/decreased UOP. Suspect ATN. S/p chest closure 12/23. Hemodynamics much worse on 12/24 with rising pressor demands. Co-ox 36%.  Bedside echo severe biventricular dysfunction with marked septal bounce. Chest re-opened 12/24.  Was atrial paced back to NSR on 12/26 with improvement in hemodynamics but back in atrial fibrillation on 12/27 and unable to pace out, DCCV to NSR on 12/28. Remains on Norepi 15 . CVP 7, co-ox 61%  - Continue  to run CVVH even today.  -Hopefully chest closure later today.  - Continue Impella to P8 to facilitate weaning of vasoactive meds.  LDH, platelets stable. On heparin gtt. - Continue NE.  Slow process with RV dysfunction.    - Continue midodrine 15 mg tid to try to wean pressors.  2. Atrial fibrillation: H/o PAF.  He was on dofetilide in the remote past but this was stopped due to noncompliance. He had an upper GI bleed from antral ulcers in 3/12. He was seen by GI and was deemed safe to restart anticoagulation as long as he remains on a PPI.  No apparent recurrence of AF until just prior to this admission, was cardioverted back to NSR in ER but went back into atrial fibrillation.  Post-op afib, DCCV to NSR/BiV pacing am 12/16 -> back to AF. Rapid atrial pacing back to SR on 12/26 -> back in AF on 12/27 and unable to pace out. 12/28 DCCV to NSR.  Maintaining NSR.  - Continue IV amiodarone 30 mg/hr while on pressors.  - He is now back on heparin gtt.  - Did not have Maze with CABG.    3. CAD:   Cath this admission with severe 3 vessel disease. CABG x 4 on 12/15.  - Continue atorvastatin, ASA.  - continue current management 4. Acute on chronic hypoxemic respiratory failure: Vent per CCM.  - CCM following - Will need tracheostomy when chest fully closed.  5. AKI on CKD stage 3: Likely ATN/cardiorenal.  On CVVH.   Per Dr Hollie Salk. Start to pull slowly today.    - CVP 7 today.  -  No urine output.  6. Diabetes: Insulin.  7. Hyponatremia: 132  8. Torsades/ICD shock: 12/7/212 in hospital event, in setting of severe hypokalemia and hypomagnesemia.   9. Gout: h/o gout. Complained of rt knee pain c/w previous flares - treated w/ prednisone burst (completed).  10: ID: Treating for PNA and also with chest wall open.   - Afebrile.  Check blood cultures x2. No GTD.   - 1/4 restarted vancomycin and meropenum.  11. FEN: Off tube feeds for surgery today.  Prealbumin 7 12. Anemia: Given 1u PRBCs 01/30/19 Hgb  7.5>8.1 >9.9 >7.9  13. Stage II Pressure Ulcer- sacrum -Reposition every 2 hours R/L   Plan for sternal wound washout today and possible closure.   Amy Clegg NP-C  02/08/2020 9:25 AM  Patient seen with NP, agree with the above note.   Stable today, remains on NE 15. He is in NSR on amiodarone gtt. CVP 8 on my measure, CVVH running even to 50 cc net negative. Co-ox 61%. Impella stable P8. LDH stable.   Afebrile, on vancomycin/meropenem.   General: NAD Neck: JVP 8, no thyromegaly or thyroid nodule.  Lungs: Decreased at bases. RF:VOHKG wall open.  Heart regular S1/S2, no S3/S4, no murmur.  Trace ankle edema. Abdomen: Soft, nontender, no hepatosplenomegaly, no distention.  Skin: Intact without lesions or rashes.  Neurologic: Alert and oriented x 3.  Psych: Follows commands.  HEENT: Normal.   Slow wean of NE, down to 15.  Continue Impella P8.  Check position under echo today.  CVP 8-9, will aim for UF even to 50 cc net negative via CVVH.  No UOP.   Will return to OR today for chest wall closure.   Continue heparin gtt for AF and Impella.  With fever in setting of open chest, he is on vancomycin/meropenem. Afebrile today.   CRITICAL CARE Performed by: Loralie Champagne  Total critical care time: 35 minutes  Critical care time was exclusive of separately billable procedures and treating other patients.  Critical care was necessary to treat or prevent imminent or life-threatening deterioration.  Critical care was time spent personally by me on the following activities: development of treatment plan with patient and/or surrogate as well as nursing, discussions with consultants, evaluation of patient's response to treatment, examination of patient, obtaining history from patient or surrogate, ordering and performing treatments and interventions, ordering and review of laboratory studies, ordering and review of radiographic studies, pulse oximetry and re-evaluation of patient's  condition.   Loralie Champagne 02/05/2020 11:05 AM

## 2020-02-02 NOTE — Progress Notes (Signed)
Patient ID: Corey Palmer, male   DOB: Jul 03, 1970, 50 y.o.   MRN: [id number]  TCTS Evening Rounds:  He remains tenuous after sternal closure today. Milrinone 0.125, NE up to 55 mcg, epi started on 10 and vaso started at 0.04 to support BP. MAP 60.  Impella functioning normally.  CVP 6. Re-zeroed and checked against chest.  Paralyzed and sedated on vent.   Remains on CRRT, positive.  Will check a Co-ox now. He does not appear to be tolerating sternal closure very well.

## 2020-02-02 NOTE — H&P (Signed)
History and Physical Interval Note:  02/21/2020 1:16 PM  Corey Palmer  has presented today for surgery, with the diagnosis of s/p CABG.  The various methods of treatment have been discussed with the patient and family. After consideration of risks, benefits and other options for treatment, the patient has consented to  Procedure(s): STERNAL WASH OUT (N/A) APPLICATION OF WOUND VAC (N/A) possible sternal closure as a surgical intervention.  [name] history has been reviewed, patient examined, no change in status, stable for surgery.  I have reviewed the patient's chart and labs.  Questions were answered to the patient's satisfaction.     [name]

## 2020-02-02 NOTE — Progress Notes (Signed)
CRITICAL VALUE ALERT  Critical Value:  Hgb 6.8  Date & Time Notied:  02/06/2020 @ [place]  Provider Notified: Loralie Champagne MD  Orders Received/Actions taken: transfuse 2 units PRBC

## 2020-02-02 NOTE — Progress Notes (Incomplete)
Nutrition Follow-up  DOCUMENTATION CODES:   Not applicable  INTERVENTION:  ***   NUTRITION DIAGNOSIS:   Inadequate oral intake related to acute illness as evidenced by NPO status.  ***  GOAL:   Patient will meet greater than or equal to 90% of their needs  ***  MONITOR:   Weight trends,Vent status  REASON FOR ASSESSMENT:   Ventilator    ASSESSMENT:   50 yo male admitted with Afib with RVR requiring cardioversion, ischemic CM requiring inotropes and impella support and ultimately underwent CABG x 4 on 12/15 with Impella still in place. PMH includes CAD, benign stomach cancer, CHF, DM, depression, CM, GERD, HTN   Pt remains on CRRT Pt remains on vent support, sedated with open chest  Tolerating Pivot 1.5 at 70 ml/hr  Labs: phosphorus 2.0 (L) Meds: Vit C 500 mg BID, thiamine,    Diet Order:   Diet Order            Diet NPO time specified  Diet effective midnight                 EDUCATION NEEDS:   Not appropriate for education at this time  Skin:  Skin Assessment: Skin Integrity Issues: Skin Integrity Issues:: Incisions,Other (Comment) Incisions: stermun, chest tubes x 4 Other: venous stasis ulcers  Last BM:  12/30 rectal tube  Height:   Ht Readings from Last 1 Encounters:  01/04/2020 6\' 3"  (1.905 m)    Weight:   Wt Readings from Last 1 Encounters:  02/18/2020 101.8 kg    Ideal Body Weight:  89 kg  BMI:  Body mass index is 28.05 kg/m.  Estimated Nutritional Needs:   Kcal:  2500-2800 kcals  Protein:  180-200 grams  Fluid:  >/= 1.5 L   Kerman Passey MS, RDN, LDN, CNSC Registered Dietitian III Clinical Nutrition RD Pager and On-Call Pager Number Located in [place]

## 2020-02-02 NOTE — Procedures (Signed)
The patient was approached emergently due to compromised hemodynamics status post chest closure.  Discussion was made with the patient's family prior to procedure.  The anterior chest was cleansed and draped in sterile field using Betadine solution.  The existing wound VAC sponge was removed.  The sternal wires were removed.  The sternal halves were separated.  An anterior mediastinal hematoma was encountered.  Hemodynamics improved once this was evacuated.  White foam was then placed underneath the sternal edges.  Black foam into pieces was placed within the mediastinum.  This was covered with a VAC drape.  This concluded the procedure.  He tolerated it well.  Kirstein Baxley Z. [name], [place]

## 2020-02-02 NOTE — Progress Notes (Signed)
*  PRELIMINARY RESULTS* Echocardiogram 2D Echocardiogram limited has been performed.  Leavy Cella 02/12/2020, 12:19 PM

## 2020-02-02 NOTE — Transfer of Care (Signed)
Immediate Anesthesia Transfer of Care Note  Patient: Corey Palmer  Procedure(s) Performed: STERNAL WASHOUT (N/A ) WOUND VAC CHANGE (N/A ) STERNAL CLOSURE (N/A Chest) TRANSESOPHAGEAL ECHOCARDIOGRAM (TEE) (N/A )  Patient Location: ICU  Anesthesia Type:General  Level of Consciousness: sedated and Patient remains intubated per anesthesia plan  Airway & Oxygen Therapy: Patient remains intubated per anesthesia plan and Patient placed on Ventilator (see vital sign flow sheet for setting)  Post-op Assessment: Report given to RN and Post -op Vital signs reviewed and stable  Post vital signs: Reviewed and stable  Last Vitals:  Vitals Value Taken Time  BP    Temp    Pulse    Resp    SpO2      Last Pain:  Vitals:   02/17/2020 1201  TempSrc: Oral  PainSc:       Patients Stated Pain Goal: 0 (33/61/22 4497)  Complications: No complications documented.

## 2020-02-02 NOTE — Anesthesia Procedure Notes (Signed)
Date/Time: 02/20/2020 1:56 PM Performed by: Colin [name], CRNA Pre-anesthesia Checklist: Patient identified, Emergency Drugs available, Suction available and Patient being monitored Patient Re-evaluated:Patient Re-evaluated prior to induction Oxygen Delivery Method: Circle system utilized Preoxygenation: Pre-oxygenation with 100% oxygen Induction Type: Inhalational induction with existing ETT Placement Confirmation: positive ETCO2 and breath sounds checked- equal and bilateral Dental Injury: Teeth and Oropharynx as per pre-operative assessment

## 2020-02-02 NOTE — Brief Op Note (Signed)
01/13/2020 - 02/11/2020  3:34 PM  PATIENT:  Corey Palmer  50 y.o. male  PRE-OPERATIVE DIAGNOSIS:  OPEN CHEST  POST-OPERATIVE DIAGNOSIS:  OPEN CHEST AND HEART FAILURE  PROCEDURE:  Procedure(s): STERNAL WASHOUT (N/A) WOUND VAC CHANGE (N/A) STERNAL CLOSURE (N/A) TRANSESOPHAGEAL ECHOCARDIOGRAM (TEE) (N/A)  SURGEON:  Surgeon(s) and Role:    * [name], MD - Primary  PHYSICIAN ASSISTANT: none  ASSISTANTS: staff   ANESTHESIA:   general  EBL:  50 mL   BLOOD ADMINISTERED:none  DRAINS: 3 Chest Tube(s) in the mediastinum and bilateral pleural spaces   LOCAL MEDICATIONS USED:  NONE  SPECIMEN:  No Specimen  DISPOSITION OF SPECIMEN:  N/A  COUNTS:  YES  TOURNIQUET:  * No tourniquets in log *  DICTATION: .Note written in EPIC  PLAN OF CARE: Admit to inpatient   PATIENT DISPOSITION:  ICU - intubated and critically ill.   Delay start of Pharmacological VTE agent (>24hrs) due to surgical blood loss or risk of bleeding: yes

## 2020-02-02 NOTE — Anesthesia Preprocedure Evaluation (Signed)
Anesthesia Evaluation  Patient identified by MRN, date of birth, ID band  Reviewed: H&P , Patient's Chart, lab work & pertinent test results, Unable to perform ROS - Chart review only  Airway Mallampati: Intubated  TM Distance: >3 FB Neck ROM: Full    Dental no notable dental hx.    Pulmonary neg pulmonary ROS,    Pulmonary exam normal breath sounds clear to auscultation       Cardiovascular hypertension, + CAD, + CABG and +CHF  Normal cardiovascular exam+ Cardiac Defibrillator  Rhythm:Regular Rate:Normal     Neuro/Psych Bipolar Disorder negative neurological ROS     GI/Hepatic Neg liver ROS, GERD  ,  Endo/Other  diabetes  Renal/GU negative Renal ROS  negative genitourinary   Musculoskeletal negative musculoskeletal ROS (+)   Abdominal   Peds negative pediatric ROS (+)  Hematology  (+) anemia ,   Anesthesia Other Findings   Reproductive/Obstetrics negative OB ROS                             Anesthesia Physical Anesthesia Plan  ASA: IV  Anesthesia Plan: General   Post-op Pain Management:    Induction: Inhalational  PONV Risk Score and Plan: 0  Airway Management Planned: Oral ETT  Additional Equipment:   Intra-op Plan:   Post-operative Plan: Post-operative intubation/ventilation  Informed Consent: I have reviewed the patients History and Physical, chart, labs and discussed the procedure including the risks, benefits and alternatives for the proposed anesthesia with the patient or authorized representative who has indicated his/her understanding and acceptance.     Dental advisory given  Plan Discussed with: CRNA and Surgeon  Anesthesia Plan Comments:         Anesthesia Quick Evaluation

## 2020-02-02 NOTE — Progress Notes (Signed)
[facility] Progress Note    Assessment/ Plan:   #AKI/CKD stage III- presumably due to ischemic ATN in setting of cardiogenic shock s/p CABG.  CRRT started 01/16/20. All fluids 4K/2.5Ca: Pre-filter 500 ml/hr, post filter 200 ml/hr, dialysate 1500 ml/hr. Right femoral HD catheter placed 01/16/20 switched to [place] on 01/28/20.  - continue CRRT--> will gently UF today net neg 50 mL/ hr and no more than that for now  # CAD s/p CABG complicated by cardiogenic shock and post-op hematoma. secondary closure on 12/23 then had to be re opened.  Went to the OR for washout on 12/28, bedside washout 02/01/19.  To OR today 02/06/2020.    # Cardiogenic shock- underlying familial cardiomyopathy s/p redo CABG, EF < 20%. Remains on pressors: Levophed, off epi, on midodrine 15 TID. Impella P8 Continue mgmt per primary  # ABLA- s/p postoperative bleeding into chest cavity s/p re-exploration for tamponade/hematoma. CT surgery following.stable, on aranesp 26mg weekly started 12/25. Increased to 1060m and transfuse as needed  #Hypophosphatemia- due to CRRT. replete prn  # Atrial fibrillation - s/p DCCV on 12/16.  On amiodarone. Has been in NSR for about a week  # VDRF- per primary svc.  #-Leukocytosis/ new fever: cultures to be drawn 02/01/20, off antibiotics    Subjective:    Seen in room.  For chest closure today.  NE down.  CVP 10 this AM.  Impella still at P8    Objective:   BP 90/79   Pulse 78   Temp 98.1 F (36.7 C) (Oral)   Resp 16   Ht '6\' 3"'  (1.905 m)   Wt 101.8 kg   SpO2 92%   BMI 28.05 kg/m   Intake/Output Summary (Last 24 hours) at 02/03/2020 1044 Last data filed at 02/03/2020 1000 Gross per 24 hour  Intake 4237.74 ml  Output 4536 ml  Net -298.26 ml   Weight change: -0.5 kg  Physical Exam: Gen: sedated CVS: Impella heard CHEST: woundvac in place, 2 chest tubes in place Resp: mech breath sounds Abd: no distention Ext: some mild dependent edema ACCESS: R  IJ nontunneled HD cath  Imaging: DG Chest Port 1 View  Result Date: 02/01/2020 CLINICAL DATA:  Intubation.  Chest tube.  Open-heart surgery. EXAM: PORTABLE CHEST 1 VIEW COMPARISON:  01/31/2020 FINDINGS: Patient rotated to the left. Endotracheal tube, feeding tube, right IJ line, bilateral chest tubes in stable position. Impella device and cardiac pacer in stable position. Low lung volumes with bibasilar atelectasis/infiltrates. Similar findings on prior exam. No pleural effusion or pneumothorax. Surgical staples right chest. IMPRESSION: 1. Lines and tubes including bilateral chest tubes in stable position. Impella device and cardiac pacer in stable position. 2. Low lung volumes with bibasilar atelectasis/infiltrates. Similar findings on prior exam. 3.  Stable cardiomegaly. Electronically Signed   By: ThMarcello MooresRegister   On: 02/01/2020 06:01    Labs: BMET Recent Labs  Lab 01/30/20 0500 01/30/20 0530 01/30/20 1555 01/31/20 0405 01/31/20 0453 01/31/20 1549 02/01/20 0415 02/01/20 0422 02/01/20 1759 02/27/2020 0407 02/15/2020 0632  NA 134*   < > 134* 133* 135 130* 133* 132* 132* 132* 136  K 4.5   < > 4.7 4.7 4.7 4.9 4.9 4.9 4.7 4.5 4.5  CL 102  --  102 100  --  98 100  --  102 99  --   CO2 25  --  25 24  --  24 23  --  23 24  --   GLUCOSE 270*  --  284* 279*  --  289* 262*  --  142* 116*  --   BUN 38*  --  40* 39*  --  43* 44*  --  43* 44*  --   CREATININE 1.58*  --  1.50* 1.59*  --  1.59* 1.70*  --  1.54* 1.46*  --   CALCIUM 8.1*  --  8.1* 8.2*  --  7.8* 8.0*  --  8.2* 8.4*  --   PHOS 2.6  --  2.7 2.5  --  2.5 2.5  --  2.4* 2.0*  --    < > = values in this interval not displayed.   CBC Recent Labs  Lab 01/30/20 0500 01/30/20 0530 01/31/20 0405 01/31/20 0453 02/01/20 0415 02/01/20 0422 02/25/2020 0407 02/13/2020 0632  WBC 13.3*  --  12.5*  --  12.1*  --  11.0*  --   NEUTROABS 9.7*  --  9.6*  --  9.2*  --  8.1*  --   HGB 7.5*   < > 8.1*   < > 8.2* 9.9* 7.9* 8.5*  HCT 25.2*   < > 25.6*    < > 26.5* 29.0* 26.8* 25.0*  MCV 95.5  --  92.1  --  92.3  --  91.8  --   PLT 339  --  322  --  373  --  352  --    < > = values in this interval not displayed.    Medications:    . vitamin C  500 mg Per Tube TID  . aspirin  324 mg Per Tube Daily  . atorvastatin  80 mg Per Tube Daily  . busPIRone  10 mg Per Tube TID  . chlorhexidine gluconate (MEDLINE KIT)  15 mL Mouth Rinse BID  . Chlorhexidine Gluconate Cloth  6 each Topical Daily  . [START ON 02/05/2020] darbepoetin (ARANESP) injection - NON-DIALYSIS  100 mcg Subcutaneous Q Sat-1800  . feeding supplement (PROSource TF)  45 mL Per Tube TID  . fiber  1 packet Per Tube BID  . FLUoxetine  40 mg Per Tube BID  . lidocaine  1 patch Transdermal Q24H  . mouth rinse  15 mL Mouth Rinse 10 times per day  . melatonin  5 mg Per Tube Daily  . midodrine  15 mg Per Tube TID WC  . multivitamin  1 tablet Per Tube QHS  . oxyCODONE  10 mg Per Tube Q8H  . pantoprazole sodium  40 mg Per Tube Daily  . QUEtiapine  50 mg Per Tube BID  . sodium chloride flush  10-40 mL Intracatheter Q12H  . sodium chloride flush  3 mL Intravenous Q12H  . thiamine  100 mg Per Tube Daily  . valproic acid  250 mg Per Tube BID      Madelon Lips, MD 02/17/2020, 10:44 AM

## 2020-02-02 NOTE — Progress Notes (Signed)
[place] for Heparin Indication: Impella 5.5  Allergies  Allergen Reactions  . Orange Fruit Anaphylaxis, Hives and Other (See Comments)    Per Pt- Blisters around lips and Hives all over, also  . Penicillins Anaphylaxis    Did it involve swelling of the face/tongue/throat, SOB, or low BP? Yes Did it involve sudden or severe rash/hives, skin peeling, or any reaction on the inside of your mouth or nose? Yes Did you need to seek medical attention at a hospital or doctor's office? No When did it last happen?childhood If all above answers are "NO", may proceed with cephalosporin use.  [name] [Empagliflozin] Itching  . Basaglar Kwikpen [Insulin Glargine] Nausea And Vomiting    Patient Measurements: Height: 6\' 3"  (190.5 cm) Weight: 101.8 kg (224 lb 6.9 oz) IBW/kg (Calculated) : 84.5 Heparin dosing wt: 101 kg  Vital Signs: Temp: 97.8 F (36.6 C) (01/05 1201) Temp Source: Oral (01/05 1201) BP: 87/72 (01/05 1215) Pulse Rate: 72 (01/05 1230)  Labs: Recent Labs    01/31/20 0405 01/31/20 0453 02/01/20 0415 02/01/20 0422 02/01/20 1759 02/05/2020 0407 02/24/2020 0632  HGB 8.1*   < > 8.2* 9.9*  --  7.9* 8.5*  HCT 25.6*   < > 26.5* 29.0*  --  26.8* 25.0*  PLT 322  --  373  --   --  352  --   HEPARINUNFRC <0.10*  --  <0.10*  --   --  <0.10*  --   CREATININE 1.59*   < > 1.70*  --  1.54* 1.46*  --    < > = values in this interval not displayed.    Estimated Creatinine Clearance: 77.4 mL/min (A) (by C-G formula based on SCr of 1.46 mg/dL (H)).   Assessment: 50 yo male s/p impella 5.5 placement. Pt started on heparinized purge and systemic heparin. Pt now s/p CABG 12/15 with Impella remaining in place c/b significant bleeding and hematoma requiring opened chest and reexploration in OR 12/16. Chest closed on 12/23, re-opened on 12/24. Underwent cardioversion in OR 12/28, s/p sternal wash out/application of wound VAC. Chest remains open, will  dose heparin conservatively. Plan to go back to OR for closure 1/5  Impella on P8 with stable purge pressures, purge flow rate 9 ml/hr (approx 450 units/hr heparin). Heparin level still undetectable with systemic heparin at 1000uts/hr (max agreed upon rate with providers) hgb varies PRBC as needed with wound vac and CT drainage, LDH stable, pltc stable    Goal of Therapy:  Heparin level 0.2-0.3 units/ml Monitor platelets by anticoagulation protocol: Yes   Plan:  continue heparin drip 1000 uts/hr this am prior to OR Post OR run purge only until 1/6 am heparin at 450 units/hr through purge, so getting total heparin 1450 units/hr Daily heparin level and CBC.   Bonnita Nasuti Pharm.D. CPP, BCPS Clinical Pharmacist [phone] 02/13/2020 4:11 PM     [facility] pharmacy phone numbers are listed on [email or web address]

## 2020-02-02 NOTE — Progress Notes (Signed)
NAME:  Corey Palmer, MRN:  [id number], DOB:  1971-01-23, LOS: 45 ADMISSION DATE:  01/17/2020, CONSULTATION DATE: 01/24/2020 REFERRING MD: Aundra Dubin, CHIEF COMPLAINT: Respiratory failure post Impella  HPI/course in hospital  50 year old man admitted with A. fib/RVR, torsades, cardiogenic shock [EF 15] requiring Impella, CABG x5, AKI requiring initiation of CRRT  Past Medical History:    has a past medical history of Anginal pain ([place]), Anxiety, Arthritis, Automatic implantable cardioverter-defibrillator in situ (2007), Bipolar disorder ([place]), CAD (coronary artery disease), Cancer ([place]) (2013), CHF (congestive heart failure) ([place]), Chondrocalcinosis of right knee (1/60/7371), Chronic systolic heart failure ([place]), Depression, Diabetes uncomplicated adult-type II, Dilated cardiomyopathy ([place]), Dyslipidemia, Dysrhythmia, GERD (gastroesophageal reflux disease), Gout, unspecified, Hepatic steatosis (2011), History of kidney stones, echocardiogram, Hypertension, Obesity (BMI 30.0-34.9), Pacemaker, Paroxysmal atrial fibrillation ([place]), Peptic ulcer, Shortness of breath, and Sleep apnea.  Significant Hospital Events:  12/2 Afib with RVR. Cardioverted in ED. AICD did not fire. Milrinone for low co ox.  12/7 ICD fired, Torsades- likely from low K of 2.8.  12/8 Cath- multivessel disease w/ low CO. Volume overloaded.  12/11 Underwent Impella 5.5 insertion for persistent symptoms of forward failure with low cardiac indices. 12/12 Back to the OR for displacement of Impella. Extubated.  12/15 CABG x4, with LIMA-LAD, SVG-PDA/PLV, SVG-OM 12/16 Emergently opened chest at bedside for tamponade, clot compressing right atrium  12/16 To OR for Exploration of chest due to bleeding  12/19 Started CRRT 12/23 Sternal Closure in OR 12/24 Worsening shock.  Chest reopened at bedside with improvement in hemodynamics.  No mediastinal hematoma. 12/25 A flutter > rapid a paced to sinus rhythm.  Remains on high-dose pressors,  inotropes 12/28 Weaning vasopressor requirements.  For chest washout today.  Back in atrial fibrillation, refractory to increased amiodarone and attempted overdrive pacing.  DCCV to NSR. Tolerating fluid removal via CRRT. 1/3: Appears euvolemic.  Adjusting glycemic control.  Chest still open so not weaning 1/4, spiking fever, Vanco meropenem resumed after culture sent from blood and sputum. 1/5 awaiting return to the OR for wound VAC dressing assessment fever curve and white blood cell count improving after antibiotics resumed the day prior  Consults:  PCCM, nephrology, cardiology, cardiothoracic surgery  Procedures:  12/2 R PICC >> 12/11 right ax impella 5.5 >> 12/15 ETT >> 12/15 right femoral arterial sheath >> 12/19 right femoral HD >> 12/23 chest tube-right pleural, left pleural , mediastinal  >>  Significant Diagnostic Tests:    Micro Data:  Blood culture 12/14 > negative  Blood culture 12/16 > negative Respiratory culture 12/20 > rare candida albicans Sternal wound culture 12/23 > no growth Fungal cx sternal wound 12/23 >> neg Fungal cx w/stain sternal wound 12/23 >> negative AFB sternal wound 12/23 >> negative 1/4 blood culture x2>>> 1/4 respiratory culture>>> Antimicrobials:  Vancomycin 12/16 >>12/31 Meropenem 12/16 >> 12/31 Vancomycin and meropenem resumed on 1/4 Interval history:  No complaints  Objective   Blood pressure (Abnormal) 87/73, pulse 75, temperature 98.1 F (36.7 C), temperature source Oral, resp. rate (Abnormal) 28, height 6\' 3"  (1.905 m), weight 101.8 kg, SpO2 100 %. CVP:  [5 mmHg-14 mmHg] 7 mmHg  Vent Mode: PRVC FiO2 (%):  [30 %] 30 % Set Rate:  [28 bmp] 28 bmp Vt Set:  [510 mL] 510 mL PEEP:  [5 cmH20] 5 cmH20 Plateau Pressure:  [20 cmH20-23 cmH20] 20 cmH20   Intake/Output Summary (Last 24 hours) at 02/25/2020 0948 Last data filed at 02/22/2020 0900 Gross per 24 hour  Intake 4414.44 ml  Output 4599 ml  Net -184.56 ml   Filed Weights    01/31/20 0448 02/01/20 0500 02/13/2020 0600  Weight: 102.3 kg 102.3 kg 101.8 kg   CVP:  [5 mmHg-14 mmHg] 7 mmHg  Examination: General 49 year old male patient sedated on ventilator does respond Cobo attempts to be interactive HEENT normocephalic atraumatic orally intubated Pulmonary: Diminished bilaterally Cardiac: Regular rate and rhythm chest wound site open wound VAC intact Abdomen soft nontender Extremities warm dry Neuro opens eyes, tries to communicate GU minimal urine output   Assessment & Plan:   Acute hypoxic and hypercapnic respiratory failure requiring mechanical ventilation following cardiac surgery. -Portable chest x-ray personally reviewed bibasilar airspace disease cannot exclude pneumonia but more likely atelectasis Plan Continuing full ventilator support VAP bundle PAD protocol he is also on Seroquel and valproic acid No weaning until chest closed Suspect will need trach   Circulatory shock due to  vasoplegic and cardiogenic shock following high risk CABG. Post cardiac surgery RV dysfunction CAD s/p CABG, c/b cardiac tamponade, hematoma evac.  Open chest with VAC.   Plan Continue to monitor volume and albumin status on CRRT Impella support per cards Continue to wean milrinone norepinephrine Continue aspirin and atorvastatin Plan for back to the OR again today on 1/5 for evaluation of wound, VAC change, versus chest closure  Paroxysmal Afib Currently normal sinus Plan IV heparin Amiodarone Telemetry monitoring  Acute kidney injury with anuria; likely cardiorenal syndrome Plan CRRT A.m. chemistry  DM2 with hyperglycemia: Plan Continuing insulin infusion for now, glucose goal 140-180  Acute blood loss anemia Hemoglobin on CBC fairly stable No evidence of bleeding Plan Continue to trend CBC Transfuse for hemoglobin less than 7  Fever Cultures recent 1/4 Plan Meropenem and vancomycin day #2, given open chest we will likely keep these  ongoing   Daily Goals Checklist  Pain/Anxiety/Delirium protocol (if indicated): fent gtt, PRN versed [facility] oxy VAP protocol (if indicated): bundle in place. Respiratory support goals: full ventilatory support. Wean once chest closed.  DVT prophylaxis:  Heparin Nutrition Status: high nutritional risk. EN per Cortrak. GI prophylaxis: pantoprazole Fluid status goals: CRRT to net even fluid Urinary catheter: n/a Central lines: PICC, PAC, HD catheter. Glucose control: insulin gtt Mobility/therapy needs: bed rest  Daily labs: CBC, renal function, cooximetry Code Status: full  Family Communication: wife updated 1/4 at bedside Disposition: ICU    This patient is critically ill with multiple organ system failure which requires frequent high complexity decision making, assessment, support, evaluation, and titration of therapies. This was completed through the application of advanced monitoring technologies and extensive interpretation of multiple databases. During this encounter critical care time was devoted to patient care services described in this note for 35 minutes.  Clementeen Graham, DO 02/06/2020 9:48 AM [facility]

## 2020-02-03 ENCOUNTER — Encounter (HOSPITAL_COMMUNITY): Payer: Self-pay | Admitting: Cardiothoracic Surgery

## 2020-02-03 ENCOUNTER — Inpatient Hospital Stay (HOSPITAL_COMMUNITY): Payer: BC Managed Care – PPO

## 2020-02-03 DIAGNOSIS — J9601 Acute respiratory failure with hypoxia: Secondary | ICD-10-CM | POA: Diagnosis not present

## 2020-02-03 DIAGNOSIS — Z9911 Dependence on respirator [ventilator] status: Secondary | ICD-10-CM | POA: Diagnosis not present

## 2020-02-03 DIAGNOSIS — Z95811 Presence of heart assist device: Secondary | ICD-10-CM | POA: Diagnosis not present

## 2020-02-03 DIAGNOSIS — I4891 Unspecified atrial fibrillation: Secondary | ICD-10-CM | POA: Diagnosis not present

## 2020-02-03 DIAGNOSIS — R570 Cardiogenic shock: Secondary | ICD-10-CM | POA: Diagnosis not present

## 2020-02-03 LAB — TYPE AND SCREEN
ABO/RH(D): A POS
Antibody Screen: NEGATIVE
Unit division: 0
Unit division: 0
Unit division: 0
Unit division: 0

## 2020-02-03 LAB — GLOBAL TEG PANEL
CFF Max Amplitude: 36.4 mm — ABNORMAL HIGH (ref 15–32)
CK with Heparinase (R): 6.3 min (ref 4.3–8.3)
Citrated Functional Fibrinogen: 664.2 mg/dL — ABNORMAL HIGH (ref 278–581)
Citrated Kaolin (K): 0.8 min (ref 0.8–2.1)
Citrated Kaolin (MA): 69.9 mm — ABNORMAL HIGH (ref 52–69)
Citrated Kaolin (R): 7.6 min (ref 4.6–9.1)
Citrated Kaolin Angle: 79.1 deg — ABNORMAL HIGH (ref 63–78)
Citrated Rapid TEG (MA): 69.5 mm (ref 52–70)

## 2020-02-03 LAB — POCT I-STAT 7, (LYTES, BLD GAS, ICA,H+H)
Acid-base deficit: 1 mmol/L (ref 0.0–2.0)
Acid-base deficit: 2 mmol/L (ref 0.0–2.0)
Acid-base deficit: 1 mmol/L (ref 0.0–2.0)
Bicarbonate: 23.6 mmol/L (ref 20.0–28.0)
Bicarbonate: 24.5 mmol/L (ref 20.0–28.0)
Bicarbonate: 24.5 mmol/L (ref 20.0–28.0)
Calcium, Ion: 1.15 mmol/L (ref 1.15–1.40)
Calcium, Ion: 1.15 mmol/L (ref 1.15–1.40)
Calcium, Ion: 1.14 mmol/L — ABNORMAL LOW (ref 1.15–1.40)
HCT: 23 % — ABNORMAL LOW (ref 39.0–52.0)
HCT: 24 % — ABNORMAL LOW (ref 39.0–52.0)
HCT: 29 % — ABNORMAL LOW (ref 39.0–52.0)
Hemoglobin: 7.8 g/dL — ABNORMAL LOW (ref 13.0–17.0)
Hemoglobin: 8.2 g/dL — ABNORMAL LOW (ref 13.0–17.0)
Hemoglobin: 9.9 g/dL — ABNORMAL LOW (ref 13.0–17.0)
O2 Saturation: 92 %
O2 Saturation: 97 %
O2 Saturation: 96 %
Patient temperature: 95
Patient temperature: 96
Patient temperature: 34.7
Potassium: 4.3 mmol/L (ref 3.5–5.1)
Potassium: 4.3 mmol/L (ref 3.5–5.1)
Potassium: 4.7 mmol/L (ref 3.5–5.1)
Sodium: 136 mmol/L (ref 135–145)
Sodium: 137 mmol/L (ref 135–145)
Sodium: 135 mmol/L (ref 135–145)
TCO2: 25 mmol/L (ref 22–32)
TCO2: 26 mmol/L (ref 22–32)
TCO2: 26 mmol/L (ref 22–32)
pCO2 arterial: 40.9 mmHg (ref 32.0–48.0)
pCO2 arterial: 41 mmHg (ref 32.0–48.0)
pCO2 arterial: 38.6 mmHg (ref 32.0–48.0)
pH, Arterial: 7.36 (ref 7.350–7.450)
pH, Arterial: 7.376 (ref 7.350–7.450)
pH, Arterial: 7.4 (ref 7.350–7.450)
pO2, Arterial: 62 mmHg — ABNORMAL LOW (ref 83.0–108.0)
pO2, Arterial: 89 mmHg (ref 83.0–108.0)
pO2, Arterial: 72 mmHg — ABNORMAL LOW (ref 83.0–108.0)

## 2020-02-03 LAB — BPAM FFP
Blood Product Expiration Date: 202201082359
Blood Product Expiration Date: 202201092359
ISSUE DATE / TIME: 202201052052
ISSUE DATE / TIME: 202201052052
Unit Type and Rh: 6200
Unit Type and Rh: 6200

## 2020-02-03 LAB — RENAL FUNCTION PANEL
Albumin: 1.9 g/dL — ABNORMAL LOW (ref 3.5–5.0)
Albumin: 2 g/dL — ABNORMAL LOW (ref 3.5–5.0)
Anion gap: 10 (ref 5–15)
Anion gap: 9 (ref 5–15)
BUN: 31 mg/dL — ABNORMAL HIGH (ref 6–20)
BUN: 28 mg/dL — ABNORMAL HIGH (ref 6–20)
CO2: 22 mmol/L (ref 22–32)
CO2: 23 mmol/L (ref 22–32)
Calcium: 7.8 mg/dL — ABNORMAL LOW (ref 8.9–10.3)
Calcium: 7.8 mg/dL — ABNORMAL LOW (ref 8.9–10.3)
Chloride: 102 mmol/L (ref 98–111)
Chloride: 103 mmol/L (ref 98–111)
Creatinine, Ser: 1.37 mg/dL — ABNORMAL HIGH (ref 0.61–1.24)
Creatinine, Ser: 1.44 mg/dL — ABNORMAL HIGH (ref 0.61–1.24)
GFR, Estimated: >60 mL/min (ref >60)
GFR, Estimated: 59 mL/min — ABNORMAL LOW (ref >60)
Glucose, Bld: 188 mg/dL — ABNORMAL HIGH (ref 70–99)
Glucose, Bld: 183 mg/dL — ABNORMAL HIGH (ref 70–99)
Phosphorus: 3.7 mg/dL (ref 2.5–4.6)
Phosphorus: 3.8 mg/dL (ref 2.5–4.6)
Potassium: 4.6 mmol/L (ref 3.5–5.1)
Potassium: 5 mmol/L (ref 3.5–5.1)
Sodium: 134 mmol/L — ABNORMAL LOW (ref 135–145)
Sodium: 135 mmol/L (ref 135–145)

## 2020-02-03 LAB — ECHOCARDIOGRAM LIMITED
Height: 75 in
Weight: 3756.64 oz

## 2020-02-03 LAB — PREPARE FRESH FROZEN PLASMA: Unit division: 0

## 2020-02-03 LAB — CBC
HCT: 23.5 % — ABNORMAL LOW (ref 39.0–52.0)
Hemoglobin: 8.2 g/dL — ABNORMAL LOW (ref 13.0–17.0)
MCH: 30.5 pg (ref 26.0–34.0)
MCHC: 34.9 g/dL (ref 30.0–36.0)
MCV: 87.4 fL (ref 80.0–100.0)
Platelets: 216 10*3/uL (ref 150–400)
RBC: 2.69 MIL/uL — ABNORMAL LOW (ref 4.22–5.81)
RDW: 17.6 % — ABNORMAL HIGH (ref 11.5–15.5)
WBC: 17.2 10*3/uL — ABNORMAL HIGH (ref 4.0–10.5)
nRBC: 0.2 % (ref 0.0–0.2)

## 2020-02-03 LAB — BPAM RBC
Blood Product Expiration Date: 202201082359
Blood Product Expiration Date: 202201252359
Blood Product Expiration Date: 202201262359
Blood Product Expiration Date: 202201262359
ISSUE DATE / TIME: 202201021147
ISSUE DATE / TIME: 202201051853
ISSUE DATE / TIME: 202201051853
ISSUE DATE / TIME: 202201052247
Unit Type and Rh: 6200
Unit Type and Rh: 6200
Unit Type and Rh: 6200
Unit Type and Rh: 6200

## 2020-02-03 LAB — CBC WITH DIFFERENTIAL/PLATELET
Abs Immature Granulocytes: 1.08 10*3/uL — ABNORMAL HIGH (ref 0.00–0.07)
Basophils Absolute: 0.1 10*3/uL (ref 0.0–0.1)
Basophils Relative: 1 %
Eosinophils Absolute: 0.2 10*3/uL (ref 0.0–0.5)
Eosinophils Relative: 1 %
HCT: 30 % — ABNORMAL LOW (ref 39.0–52.0)
Hemoglobin: 9.9 g/dL — ABNORMAL LOW (ref 13.0–17.0)
Immature Granulocytes: 5 %
Lymphocytes Relative: 4 %
Lymphs Abs: 0.9 10*3/uL (ref 0.7–4.0)
MCH: 29.3 pg (ref 26.0–34.0)
MCHC: 33 g/dL (ref 30.0–36.0)
MCV: 88.8 fL (ref 80.0–100.0)
Monocytes Absolute: 2.1 10*3/uL — ABNORMAL HIGH (ref 0.1–1.0)
Monocytes Relative: 9 %
Neutro Abs: 19.3 10*3/uL — ABNORMAL HIGH (ref 1.7–7.7)
Neutrophils Relative %: 80 %
Platelets: 254 10*3/uL (ref 150–400)
RBC: 3.38 MIL/uL — ABNORMAL LOW (ref 4.22–5.81)
RDW: 16.3 % — ABNORMAL HIGH (ref 11.5–15.5)
WBC: 23.7 10*3/uL — ABNORMAL HIGH (ref 4.0–10.5)
nRBC: 0.2 % (ref 0.0–0.2)

## 2020-02-03 LAB — PROTIME-INR
INR: 1.4 — ABNORMAL HIGH (ref 0.8–1.2)
INR: 1.4 — ABNORMAL HIGH (ref 0.8–1.2)
Prothrombin Time: 16.7 seconds — ABNORMAL HIGH (ref 11.4–15.2)
Prothrombin Time: 16.4 seconds — ABNORMAL HIGH (ref 11.4–15.2)

## 2020-02-03 LAB — PROCALCITONIN: Procalcitonin: 7.82 ng/mL

## 2020-02-03 LAB — CULTURE, RESPIRATORY W GRAM STAIN

## 2020-02-03 LAB — HEPARIN LEVEL (UNFRACTIONATED): Heparin Unfractionated: <0.1 IU/mL — ABNORMAL LOW (ref 0.30–0.70)

## 2020-02-03 LAB — APTT: aPTT: 40 seconds — ABNORMAL HIGH (ref 24–36)

## 2020-02-03 LAB — GLUCOSE, CAPILLARY
Glucose-Capillary: 153 mg/dL — ABNORMAL HIGH (ref 70–99)
Glucose-Capillary: 162 mg/dL — ABNORMAL HIGH (ref 70–99)
Glucose-Capillary: 187 mg/dL — ABNORMAL HIGH (ref 70–99)

## 2020-02-03 LAB — COOXEMETRY PANEL
Carboxyhemoglobin: 1.7 % — ABNORMAL HIGH (ref 0.5–1.5)
Methemoglobin: 1 % (ref 0.0–1.5)
O2 Saturation: 74.7 %
Total hemoglobin: 9.5 g/dL — ABNORMAL LOW (ref 12.0–16.0)

## 2020-02-03 LAB — FIBRINOGEN: Fibrinogen: 468 mg/dL (ref 210–475)

## 2020-02-03 LAB — FOLATE: Folate: 7.8 ng/mL (ref >5.9)

## 2020-02-03 LAB — LACTATE DEHYDROGENASE: LDH: 316 U/L — ABNORMAL HIGH (ref 98–192)

## 2020-02-03 LAB — MAGNESIUM: Magnesium: 2.2 mg/dL (ref 1.7–2.4)

## 2020-02-03 MED ORDER — HEPARIN SODIUM (PORCINE) 5000 UNIT/ML IJ SOLN
INTRAVENOUS | Status: DC
  Filled 2020-02-03 (×11): qty 10

## 2020-02-03 MED ORDER — SODIUM CHLORIDE 0.9 % IV SOLN: INTRAVENOUS | Status: DC | PRN

## 2020-02-03 MED ORDER — ALBUMIN HUMAN 5 % IV SOLN
25.0000 g | Freq: Once | INTRAVENOUS | Status: AC
  Administered 2020-02-03: 25 g via INTRAVENOUS
  Filled 2020-02-03: qty 500

## 2020-02-03 NOTE — Anesthesia Postprocedure Evaluation (Signed)
Anesthesia Post Note  Patient: Corey Palmer  Procedure(s) Performed: STERNAL WASHOUT (N/A ) WOUND VAC CHANGE (N/A ) STERNAL CLOSURE (N/A Chest) TRANSESOPHAGEAL ECHOCARDIOGRAM (TEE) (N/A )     Patient location during evaluation: SICU Anesthesia Type: General Level of consciousness: sedated Pain management: pain level controlled Vital Signs Assessment: post-procedure vital signs reviewed and stable Respiratory status: patient remains intubated per anesthesia plan Cardiovascular status: stable Postop Assessment: no apparent nausea or vomiting Anesthetic complications: no   No complications documented.  Last Vitals:  Vitals:   02/03/20 0630 02/03/20 0800  BP:  (!) 82/63  Pulse: 88 95  Resp: (!) 28 (!) 28  Temp:  36.8 C  SpO2: 100% 100%    Last Pain:  Vitals:   02/03/20 0800  TempSrc: Esophageal  PainSc:                  [name]

## 2020-02-03 NOTE — Progress Notes (Signed)
Brief Cortrak Note:   Received message from RN stating that Cortrak tube was noted to be coiled in patient mouth and RN asking if RD can come assess.   Cortrak tube remain clipped in place at 83 cm but tube was coiled in patient mouth. RD successfully pulled back on Cortrak tube to remove coil in mouth and Cortrak was re-advanced. Abd xray obtained to confirm placement prior to use.   Of note, this is NOT a Water quality scientist service day  Kerman Passey MS, RDN, LDN, CNSC Registered Dietitian III Clinical Nutrition RD Pager and On-Call Pager Number Located in [place]

## 2020-02-03 NOTE — Progress Notes (Signed)
NAME:  Corey Palmer, MRN:  [id number], DOB:  1970-11-22, LOS: 9 ADMISSION DATE:  01/28/2020, CONSULTATION DATE: 01/12/2020 REFERRING MD: Aundra Dubin, CHIEF COMPLAINT: Respiratory failure post Impella  HPI/course in hospital  50 year old man admitted with A. fib/RVR, torsades, cardiogenic shock [EF 15] requiring Impella, CABG x5, AKI requiring initiation of CRRT  Past Medical History:    has a past medical history of Anginal pain ([place]), Anxiety, Arthritis, Automatic implantable cardioverter-defibrillator in situ (2007), Bipolar disorder ([place]), CAD (coronary artery disease), Cancer ([place]) (2013), CHF (congestive heart failure) ([place]), Chondrocalcinosis of right knee (7/82/9562), Chronic systolic heart failure ([place]), Depression, Diabetes uncomplicated adult-type II, Dilated cardiomyopathy ([place]), Dyslipidemia, Dysrhythmia, GERD (gastroesophageal reflux disease), Gout, unspecified, Hepatic steatosis (2011), History of kidney stones, echocardiogram, Hypertension, Obesity (BMI 30.0-34.9), Pacemaker, Paroxysmal atrial fibrillation ([place]), Peptic ulcer, Shortness of breath, and Sleep apnea.  Significant Hospital Events:  12/2 Afib with RVR. Cardioverted in ED. AICD did not fire. Milrinone for low co ox.  12/7 ICD fired, Torsades- likely from low K of 2.8.  12/8 Cath- multivessel disease w/ low CO. Volume overloaded.  12/11 Underwent Impella 5.5 insertion for persistent symptoms of forward failure with low cardiac indices. 12/12 Back to the OR for displacement of Impella. Extubated.  12/15 CABG x4, with LIMA-LAD, SVG-PDA/PLV, SVG-OM 12/16 Emergently opened chest at bedside for tamponade, clot compressing right atrium  12/16 To OR for Exploration of chest due to bleeding  12/19 Started CRRT 12/23 Sternal Closure in OR 12/24 Worsening shock.  Chest reopened at bedside with improvement in hemodynamics.  No mediastinal hematoma. 12/25 A flutter > rapid a paced to sinus rhythm.  Remains on high-dose pressors,  inotropes 12/28 Weaning vasopressor requirements.  For chest washout today.  Back in atrial fibrillation, refractory to increased amiodarone and attempted overdrive pacing.  DCCV to NSR. Tolerating fluid removal via CRRT. 1/3: Appears euvolemic.  Adjusting glycemic control.  Chest still open so not weaning 1/4, spiking fever, Vanco meropenem resumed after culture sent from blood and sputum. 1/5 awaiting return to the OR for wound VAC dressing assessment fever curve and white blood cell count improving after antibiotics resumed the day prior  Consults:  PCCM, nephrology, cardiology, cardiothoracic surgery  Procedures:  12/2 R PICC >> 12/11 right ax impella 5.5 >> 12/15 ETT >> 12/15 right femoral arterial sheath >> 12/19 right femoral HD >> 12/23 chest tube-right pleural, left pleural , mediastinal  >>  Significant Diagnostic Tests:    Micro Data:  Blood culture 12/14 > negative  Blood culture 12/16 > negative Respiratory culture 12/20 > rare candida albicans Sternal wound culture 12/23 > no growth Fungal cx sternal wound 12/23 >> neg Fungal cx w/stain sternal wound 12/23 >> negative AFB sternal wound 12/23 >> negative 1/4 blood culture x2>>> 1/4 respiratory culture>>> Antimicrobials:  Vancomycin 12/16 >>12/31 Meropenem 12/16 >> 12/31 Vancomycin and meropenem resumed on 1/4 Interval history:  No complaints  Objective   Blood pressure (Abnormal) 79/67, pulse (Abnormal) 101, temperature 98.2 F (36.8 C), temperature source Esophageal, resp. rate (Abnormal) 28, height 6\' 3"  (1.905 m), weight 106.5 kg, SpO2 98 %. CVP:  [0 mmHg-13 mmHg] 11 mmHg  Vent Mode: PRVC FiO2 (%):  [30 %-60 %] 60 % Set Rate:  [28 bmp] 28 bmp Vt Set:  [510 mL] 510 mL PEEP:  [5 cmH20-8 cmH20] 8 cmH20 Plateau Pressure:  [18 cmH20-28 cmH20] 27 cmH20   Intake/Output Summary (Last 24 hours) at 02/03/2020 1139 Last data filed at 02/03/2020 1100 Gross per 24 hour  Intake 8258.01 ml  Output 2762.1 ml  Net  5495.91 ml   Filed Weights   02/01/20 0500 02/10/2020 0600 02/03/20 0600  Weight: 102.3 kg 101.8 kg 106.5 kg   CVP:  [0 mmHg-13 mmHg] 11 mmHg  Examination: General 50 year old male patient sedated on ventilator does respond Cobo attempts to be interactive HEENT normocephalic atraumatic orally intubated Pulmonary: Diminished bilaterally Cardiac: Regular rate and rhythm chest wound site open wound VAC intact Abdomen soft nontender Extremities warm dry Neuro opens eyes, tries to communicate GU minimal urine output   Assessment & Plan:   Acute hypoxic and hypercapnic respiratory failure requiring mechanical ventilation following cardiac surgery. Portable chest x-ray personally reviewed and comparing film from day prior.  Tubes and lines are in satisfactory position.  There is now right greater than left airspace disease, this looks worse than day prior.  There is significant right upper lobe density versus fluid collection of unclear etiology Plan Continue full ventilator support Continue Seroquel and valproic acid PAD protocol A.m. chest x-ray  Circulatory shock due to  Vasoplegic, cardiogenic shock, following high risk CABG, status post progressive shock on 1/5 secondary to obstructive physiology from hematoma after attempted chest closure necessitating emergent reopening of chest with reevacuation Post cardiac surgery RV dysfunction CAD s/p CABG, c/b cardiac tamponade, hematoma evac.  Open chest with VAC.   Plan Keep euvolemic Impella support per cards Continue to titrate vasoactive drips for mean arterial pressure greater than 65 Milrinone per heart failure Aspirin and Lipitor  Sepsis/septic shock I am not convinced that sepsis is the primary driver at this point Plan Meropenem and vancomycin day #3 length of therapy to be determined, minimally will stay on board while chest remains open Awaiting CT chest  Paroxysmal Afib Currently normal sinus Plan IV  heparin Amiodarone Telemetry monitoring  Acute kidney injury with anuria; likely cardiorenal syndrome Plan CRRT follow-up chemistries  DM2 with hyperglycemia: Plan Remains on insulin infusion for blood glucose goal 140-180 Acute blood loss anemia Hemoglobin dropped to 6.7 on 1/5 requiring blood Plan Continue to trend CBC Transfuse for hemoglobin less than 7    Daily Goals Checklist  Pain/Anxiety/Delirium protocol (if indicated): fent gtt, PRN versed [facility] oxy VAP protocol (if indicated): bundle in place. Respiratory support goals: full ventilatory support. Wean once chest closed.  DVT prophylaxis:  Heparin Nutrition Status: high nutritional risk. EN per Cortrak. GI prophylaxis: pantoprazole Fluid status goals: CRRT to net even fluid Urinary catheter: n/a Central lines: PICC, PAC, HD catheter. Glucose control: insulin gtt Mobility/therapy needs: bed rest  Daily labs: CBC, renal function, cooximetry Code Status: full  Family Communication: wife updated by primary  Disposition: ICU  cct 32 min  Erick Colace ACNP-BC [place] Pager # [phone] OR # [phone] if no answer

## 2020-02-03 NOTE — Progress Notes (Signed)
[facility] Progress Note    Assessment/ Plan:    9M with familial cardiomyopathy EF20% s/p redo CABG 75/64/33 complicated by persistent shock, multiple attempts at sternal closure with formation of chest hematoma and tamponade, Afib with RVR, and AKI requiring CRRT.    #AKI/CKD stage III- presumably due to ischemic ATN in setting of cardiogenic shock s/p CABG.  CRRT started 01/16/20. All fluids 4K/2.5Ca.  Right femoral HD catheter placed 01/16/20 switched to [place] on 01/28/20.  - continue CRRT--> will try to keep even vs slightly net negative for now, unclear if pt will tolerate   # CAD s/p CABG complicated by cardiogenic shock and post-op hematoma. secondary closure on 12/23 then had to be re opened.  Went to the OR for washout on 12/28, bedside washout 02/01/19.  OR 02/17/2020 for closure, then had emergent reopening at the bedside 02/21/2020 d/t anterior chest hematoma again. Vac foam replaced, chest tubes doing work of suction for now  # Cardiogenic shock- underlying familial cardiomyopathy s/p redo CABG, EF ~20%. Remains on pressors: levophed up to 80, epi back on at 10, on milrinone as well. Midodrine 15 TID. Impella P8. Continue mgmt per primary  # ABLA- s/p postoperative bleeding into chest cavity s/p re-exploration for tamponade/hematoma. Received multiple blood products yesterday  # Atrial fibrillation - s/p DCCV on 12/16.  On amiodarone. Has been in NSR for about a week  # VDRF- per primary svc.  #-Leukocytosis/ new fever: cultures redrawn 02/01/20, vanc and meropenem restarted 02/01/20 (had been off since 01/28/20)  # R loculated pleural effusion: for CT chest today with contrast which is necessary for clinical decision making.  #Dispo: critically ill in the ICU   Subjective:    Seen in room.  Didn't tolerate chest closure, had hematoma evacuated with tamponade.  Has vac foam in place now. Marked increase in pressor requirements.  ? Accuracy re: a line, PCCM to  replace contralateral side as Impella.   Net + 4L yesterday.  CXR with new R apical loculated pleural effusion, plan for CT chest.  Continues on vanc/ meropenem (resumed 02/01/20)   Objective:   BP (!) 79/67   Pulse (!) 101   Temp 98.2 F (36.8 C) (Esophageal)   Resp (!) 28   Ht _0  (1.905 m)   Wt 106.5 kg   SpO2 98%   BMI 29.35 kg/m   Intake/Output Summary (Last 24 hours) at 02/03/2020 1139 Last data filed at 02/03/2020 1100 Gross per 24 hour  Intake 8258.01 ml  Output 2762.1 ml  Net 5495.91 ml   Weight change: 4.7 kg  Physical Exam: Gen: sedated CVS: Impella heard CHEST: woundvac foam in place, 2 chest tubes in place, R [place] Impella Resp: mech breath sounds Abd: no distention Ext: ++ dependent edema dependent edema ACCESS: R IJ nontunneled HD cath  Imaging: DG Chest 1 View  Result Date: 02/15/2020 CLINICAL DATA:  History of open heart surgery EXAM: CHEST  1 VIEW COMPARISON:  02/01/2020 FINDINGS: Endotracheal tube in good position. Impella device overlying the left ventricle region. AICD unchanged in position. Right jugular central venous catheter tip in the mid SVC. Bilateral chest tubes in place. No pneumothorax. Right arm PICC tip enters SVC with the tip not visualized. Cardiac enlargement with slight vascular congestion. Improvement in bibasilar atelectasis. Small left effusion. IMPRESSION: Support lines unchanged in position. Improved aeration in the lung bases.  No pneumothorax. Electronically Signed   By: Franchot Gallo M.D.   On: 02/17/2020 16:16  DG CHEST PORT 1 VIEW  Result Date: 02/03/2020 CLINICAL DATA:  Respiratory failure EXAM: PORTABLE CHEST 1 VIEW COMPARISON:  02/10/2020 FINDINGS: Endotracheal tube seen 4 cm above the carina. Nasoenteric feeding tube extends into the upper abdomen beyond the margin of the examination. Right internal jugular hemodialysis catheter tip at the superior cavoatrial junction. Right subclavian left ventricular assist device appears slightly  withdrawn, though this likely still crosses the aortic valve plane. Median sternotomy wires have been removed in the interval. Loculated right upper lobe pleural fluid has developed. Single right and dual left chest tubes are in place. No pneumothorax. Mild interstitial pulmonary edema has progressed slightly within the perihilar regions, likely cardiogenic in nature. Moderate cardiomegaly appears unchanged peer IMPRESSION: Interval removal of the a sternotomy wires compatible with given history of sternal debridement. Moderate loculated pleural effusion at the right apex, possibly the residua of sternal washout. Slight interval retraction of the left ventricular assist device still likely crossing the aortic valve plane. Progressive mild cardiogenic failure. Bilateral chest tubes in place.  No pneumothorax. Electronically Signed   By: Fidela Salisbury MD   On: 02/03/2020 05:27   DG Abd Portable 1V  Result Date: 02/03/2020 CLINICAL DATA:  Check feeding catheter placement EXAM: PORTABLE ABDOMEN - 1 VIEW COMPARISON:  01/31/2020 FINDINGS: Feeding catheter is noted extending into the distal stomach. The overall appearance is similar to that seen on the prior exam. Scattered large and small bowel gas is seen. IMPRESSION: Feeding catheter in the distal stomach. Electronically Signed   By: Inez Catalina M.D.   On: 02/03/2020 09:44   ECHOCARDIOGRAM LIMITED  Result Date: 02/06/2020    ECHOCARDIOGRAM LIMITED REPORT   [name] Date of Exam: 02/28/2020 Medical Rec #:  [id number]     Height:       75.0 in Accession #:    [id number]    Weight:       224.4 lb Date of Birth:  09/12/1970     BSA:          2.305 m Patient Age:    50 years      BP:           96/80 mmHg Patient Gender: M             HR:           85 bpm. Exam Location:  Inpatient Procedure: Limited Echo Indications:    CHF-Acute Systolic X83.29 impella placement  History:        Patient has prior history of Echocardiogram examinations, most                  recent 01/28/2020. Prior CABG and Defibrillator,                 Arrythmias:Atrial Fibrillation; Risk Factors:Non-Smoker and                 Diabetes. Cardiogenic shock.  Sonographer:    Leavy Cella Referring Phys: [place]  1. 2D echo done for Impella placement. Final placement demonstrates tip of catheter 4.42cm from aortic valve cusps.. Left ventricular ejection fraction, by estimation, is 20 to 25%. The left ventricle has severely decreased function. The left ventricle demonstrates global hypokinesis. FINDINGS  Left Ventricle: 2D echo done for Impella placement. Final placement demonstrates tip of catheter 4.42cm from aortic valve cusps. Left ventricular ejection fraction, by estimation, is 20 to 25%. The left ventricle has severely decreased function. The  left ventricle demonstrates global hypokinesis. Fransico Him MD Electronically signed by Fransico Him MD Signature Date/Time: 02/01/2020/12:36:36 PM    Final     Labs: BMET Recent Labs  Lab 01/31/20 0405 01/31/20 2542 01/31/20 1549 02/01/20 7062 02/01/20 0422 02/01/20 1759 01/31/2020 0407 02/26/2020 3762 02/15/2020 1725 02/26/2020 1851 02/19/2020 1955 02/04/2020 2004 02/04/2020 2123 02/03/20 0004 02/03/20 0420 02/03/20 0429  NA 133*   < > 130* 133*   < > 132* 132*   < > 133* 138 133* 135 136 137 135 134*  K 4.7   < > 4.9 4.9   < > 4.7 4.5   < > 4.2 4.1 4.3 4.3 4.3 4.3 4.7 4.6  CL 100  --  98 100  --  102 99  --  102  --  102  --   --   --   --  102  CO2 24  --  24 23  --  23 24  --  23  --  21*  --   --   --   --  22  GLUCOSE 279*  --  289* 262*  --  142* 116*  --  203*  --  185*  --   --   --   --  188*  BUN 39*  --  43* 44*  --  43* 44*  --  39*  --  36*  --   --   --   --  31*  CREATININE 1.59*  --  1.59* 1.70*  --  1.54* 1.46*  --  1.65*  --  1.55*  --   --   --   --  1.37*  CALCIUM 8.2*  --  7.8* 8.0*  --  8.2* 8.4*  --  8.1*  --  8.3*  --   --   --   --  7.8*  PHOS 2.5  --  2.5 2.5  --  2.4* 2.0*  --   2.8  --   --   --   --   --   --  3.7   < > = values in this interval not displayed.   CBC Recent Labs  Lab 01/31/20 0405 01/31/20 0453 02/01/20 0415 02/01/20 0422 02/22/2020 0407 02/12/2020 8315 01/31/2020 1725 02/05/2020 1851 02/27/2020 2116 02/19/2020 2123 02/25/2020 2231 02/03/20 0004 02/03/20 0420 02/03/20 0429  WBC 12.5*  --  12.1*  --  11.0*  --  17.8*  --  26.5*  --  24.9*  --   --  23.7*  NEUTROABS 9.6*  --  9.2*  --  8.1*  --   --   --   --   --   --   --   --  19.3*  HGB 8.1*   < > 8.2*   < > 7.9*   < > 6.8*   < > 8.0*   < > 6.7* 7.8* 9.9* 9.9*  HCT 25.6*   < > 26.5*   < > 26.8*   < > 23.1*   < > 25.2*   < > 21.3* 23.0* 29.0* 30.0*  MCV 92.1  --  92.3  --  91.8  --  93.1  --  93.0  --  92.6  --   --  88.8  PLT 322  --  373  --  352  --  489*  --  402*  --  319  --   --  254   < > = values in  this interval not displayed.    Medications:    . artificial tears  1 application Both Eyes P3A  . vitamin C  500 mg Per Tube TID  . aspirin  324 mg Per Tube Daily  . atorvastatin  80 mg Per Tube Daily  . busPIRone  10 mg Per Tube TID  . chlorhexidine gluconate (MEDLINE KIT)  15 mL Mouth Rinse BID  . Chlorhexidine Gluconate Cloth  6 each Topical Daily  . [START ON 02/05/2020] darbepoetin (ARANESP) injection - NON-DIALYSIS  100 mcg Subcutaneous Q Sat-1800  . docusate  100 mg Per Tube BID  . feeding supplement (PROSource TF)  45 mL Per Tube TID  . fiber  1 packet Per Tube BID  . FLUoxetine  40 mg Per Tube BID  . lidocaine  1 patch Transdermal Q24H  . mouth rinse  15 mL Mouth Rinse 10 times per day  . melatonin  5 mg Per Tube Daily  . midodrine  15 mg Per Tube TID WC  . multivitamin  1 tablet Per Tube QHS  . oxyCODONE  10 mg Per Tube Q8H  . pantoprazole sodium  40 mg Per Tube Daily  . polyethylene glycol  17 g Per Tube Daily  . QUEtiapine  50 mg Per Tube BID  . sodium chloride flush  10-40 mL Intracatheter Q12H  . sodium chloride flush  3 mL Intravenous Q12H  . thiamine  100 mg Per  Tube Daily  . valproic acid  250 mg Per Tube BID      Madelon Lips, MD 02/03/2020, 11:39 AM

## 2020-02-03 NOTE — Progress Notes (Signed)
   02/03/20 1100  Clinical Encounter Type  Visited With Family  Visit Type Spiritual support  Referral From Nurse  Consult/Referral To Chaplain  Spiritual Encounters  Spiritual Needs Prayer;Emotional  Stress Factors  Patient Stress Factors Health changes;Major life changes  Family Stress Factors Health changes;Major life changes  Mr. Usery wife [name] requested to speak with the Chaplain and the Director of [facility]. Mrs. Carlo was concerned about PPE safety not being used properly and addressed this with the director and her concerns about her husband's care.   Mrs. Simkin has a full plate and is trying to be strong for her children and husband. Every day since her husband has been admitted she has been by his side. Mrs. Varano, sister-in-law Amedeo Plenty and Chaplain suggested she step away from the hospital for a while to rest, restore mentally and regroup. She agreed and her son will come sit with his dad until tomorrow.   We had prayer and I informed her whenever she needed to talk  have the nurse page the chaplain.   Chaplain Mariadel Mruk Morgan-Simpson [phone]

## 2020-02-03 NOTE — Procedures (Signed)
Arterial Catheter Insertion Procedure Note  Corey Palmer  [id number]  Jul 04, 1970  Date:02/03/20  Time:1:40 PM    Provider Performing: Clementeen Graham    Procedure: Insertion of Arterial Line [phone]) with US guidance ([id number])   Indication(s) Blood pressure monitoring and/or need for frequent ABGs  Consent Risks of the procedure as well as the alternatives and risks of each were explained to the patient and/or caregiver.  Consent for the procedure was obtained and is signed in the bedside chart  Anesthesia None   Time Out Verified patient identification, verified procedure, site/side was marked, verified correct patient position, special equipment/implants available, medications/allergies/relevant history reviewed, required imaging and test results available.   Sterile Technique Maximal sterile technique including full sterile barrier drape, hand hygiene, sterile gown, sterile gloves, mask, hair covering, sterile ultrasound probe cover (if used).   Procedure Description Area of catheter insertion was cleaned with chlorhexidine and draped in sterile fashion. With real-time ultrasound guidance an arterial catheter was placed into the left radial artery.  Appropriate arterial tracings confirmed on monitor.     Complications/Tolerance None; patient tolerated the procedure well.   EBL Minimal   Specimen(s) None  Erick Colace ACNP-BC [place] Pager # [phone] OR # [phone] if no answer

## 2020-02-03 NOTE — Progress Notes (Signed)
EVENING ROUNDS NOTE :     [address]       [place] [address]             [phone]                 1 Day Post-Op Procedure(s) (LRB): STERNAL WASHOUT (N/A) WOUND VAC CHANGE (N/A) STERNAL CLOSURE (N/A) TRANSESOPHAGEAL ECHOCARDIOGRAM (TEE) (N/A)   Total Length of Stay:  LOS: 35 days  Events:   No events 4.3L of flow on impella     BP 102/70 (BP Location: Left Arm)   Pulse 95   Temp 98.2 F (36.8 C) (Core)   Resp (!) 28   Ht 6\' 3"  (1.905 m)   Wt 106.5 kg   SpO2 100%   BMI 29.35 kg/m   CVP:  [0 mmHg-14 mmHg] 14 mmHg  Vent Mode: PRVC FiO2 (%):  [30 %-60 %] 60 % Set Rate:  [28 bmp] 28 bmp Vt Set:  [510 mL] 510 mL PEEP:  [5 cmH20-8 cmH20] 8 cmH20 Plateau Pressure:  [23 cmH20-28 cmH20] 28 cmH20  .  prismasol BGK 4/2.5 500 mL/hr at 02/03/20 0149  .  prismasol BGK 4/2.5 200 mL/hr at 02/03/20 1715  . sodium chloride Stopped (01/29/20 0955)  . sodium chloride    . amiodarone 30 mg/hr (02/03/20 1600)  . cisatracurium (NIMBEX) infusion 2 mg/mL Stopped (02/03/20 0837)  . electrolyte-A Stopped (02/03/20 3845)  . epinephrine 10 mcg/min (02/03/20 1600)  . feeding supplement (PIVOT 1.5 CAL) 1,000 mL (02/03/20 1300)  . fentaNYL infusion INTRAVENOUS 225 mcg/hr (02/03/20 1600)  . impella catheter heparin 50 unit/mL in dextrose 5%    . insulin 1.1 mL/hr at 02/03/20 1600  . lactated ringers 20 mL/hr at 02/03/20 1600  . meropenem (MERREM) IV Stopped (02/03/20 1450)  . midazolam 2 mg/hr (02/03/20 1600)  . milrinone 0.25 mcg/kg/min (02/03/20 1600)  . norepinephrine (LEVOPHED) Adult infusion 76 mcg/min (02/03/20 1600)  . prismasol BGK 4/2.5 1,500 mL/hr at 02/03/20 1513  . vancomycin Stopped (02/03/20 1559)  . vasopressin 0.04 Units/min (02/03/20 1639)    I/O last 3 completed shifts: In: 9312.3 [I.V.:5605.5; XMIWO:0321; Other:289; NG/GT:780; IV Piggyback:649.8] Out: 5029.1 [Drains:875; Other:3584.1; Stool:210; Blood:50; Chest Tube:310]   CBC Latest Ref  Rng & Units 02/03/2020 02/03/2020 02/03/2020  WBC 4.0 - 10.5 K/uL 23.7(H) - -  Hemoglobin 13.0 - 17.0 g/dL 9.9(L) 9.9(L) 7.8(L)  Hematocrit 39.0 - 52.0 % 30.0(L) 29.0(L) 23.0(L)  Platelets 150 - 400 K/uL 254 - -    BMP Latest Ref Rng & Units 02/03/2020 02/03/2020 02/03/2020  Glucose 70 - 99 mg/dL 188(H) - -  BUN 6 - 20 mg/dL 31(H) - -  Creatinine 0.61 - 1.24 mg/dL 1.37(H) - -  BUN/Creat Ratio 9 - 20 - - -  Sodium 135 - 145 mmol/L 134(L) 135 137  Potassium 3.5 - 5.1 mmol/L 4.6 4.7 4.3  Chloride 98 - 111 mmol/L 102 - -  CO2 22 - 32 mmol/L 22 - -  Calcium 8.9 - 10.3 mg/dL 7.8(L) - -    ABG    Component Value Date/Time   PHART 7.400 02/03/2020 0420   PCO2ART 38.6 02/03/2020 0420   PO2ART 72 (L) 02/03/2020 0420   HCO3 24.5 02/03/2020 0420   TCO2 26 02/03/2020 0420   ACIDBASEDEF 1.0 02/03/2020 0420   O2SAT 74.7 02/03/2020 0429       Melodie Bouillon, MD 02/03/2020 5:40 PM

## 2020-02-03 NOTE — Progress Notes (Addendum)
Patient ID: Corey Palmer, male   DOB: 10-Jan-1971, 50 y.o.   MRN: [id number]     Advanced Heart Failure Rounding Note  PCP-Cardiologist: No primary care provider on file.   Subjective:    - 12/7 Torsades -ICD shock x1.   - 12/11 Impella 5.5 placed.  - 12/12 Impella repositioned in OR - 12/15 CABG x 4 with LIMA-LAD, SVG-PDA/PLV, SVG-OM - 12/16 DCCV afib.  Back to OR to re-open chest and again to evacuate hematoma.  - 12/19 CVVH initiated  - 12/23 Chest closed - 12/24 Chest reopened - 12/26 RAP back into NSR - 12/27 back in atrial fibrillation, unable to rapid atrial pace out.   - 12/28 To OR, chest partially closed and wound vac placed.  DCCV to NSR.  - 12/31 Antibiotics stopped. Swan removed, HD catheter moved from right femoral to right IJ.  - 02/16/2020 To OR for chest closure  - 02/12/2020 Chest reopened emergently at bedside again to evacuate hematoma    Remains tenuous. Maxed out on pressor support. On NE 80, VP 0.04, Epi 10. Milrinone 0.25. Impella at P-8. Flow 4.3. No alarms.   MAP 63 off arterial line but in 70s off cuff. Currently not pulling CVVH   WBC 27>>25>>24K. AF.  Remains on vancomycin/meropenem.  Hgb 9.9  On Amio gtt 30. In Sinus.   Impella 5.5 P-8  Flow 4.3 Waveforms ok. No alarms LDH 316    Objective:   Weight Range: 106.5 kg Body mass index is 29.35 kg/m.   Vital Signs:   Temp:  [88.7 F (31.5 C)-98.1 F (36.7 C)] 97.2 F (36.2 C) (01/06 0600) Pulse Rate:  [72-94] 88 (01/06 0630) Resp:  [0-28] 28 (01/06 0630) BP: (77-121)/(60-93) 90/68 (01/06 0600) SpO2:  [92 %-100 %] 100 % (01/06 0630) Arterial Line BP: (70-148)/(48-74) 92/60 (01/06 0630) FiO2 (%):  [30 %-60 %] 60 % (01/06 0400) Weight:  [106.5 kg] 106.5 kg (01/06 0600) Last BM Date: 02/01/2020  Weight change: Filed Weights   02/01/20 0500 01/31/2020 0600 02/03/20 0600  Weight: 102.3 kg 101.8 kg 106.5 kg    Intake/Output:   Intake/Output Summary (Last 24 hours) at 02/03/2020 0713 Last data  filed at 02/03/2020 0600 Gross per 24 hour  Intake 7145.14 ml  Output 3104.1 ml  Net 4041.04 ml      Physical Exam   CVP 7-8  General:  Critically ill, intubated and sedated   HEENT: + ETT Neck: supple. no JVD. Carotids 2+ bilat; no bruits. No lymphadenopathy or thryomegaly appreciated. Cor: Open chest. + CTs. PMI nondisplaced. Regular rate & rhythm. No rubs, gallops or murmurs.  Lungs: intubated and course BS bilaterally  Abdomen: soft, nontender, nondistended. No hepatosplenomegaly. No bruits or masses. Good bowel sounds. Extremities: no cyanosis, clubbing, rash, 2+ bilateral LEE up to knees  Neuro: intubated and sedated   Telemetry   NSR 80s   Labs    CBC Recent Labs    02/11/2020 0407 02/17/2020 0632 02/16/2020 2231 02/03/20 0004 02/03/20 0420 02/03/20 0429  WBC 11.0*   < > 24.9*  --   --  23.7*  NEUTROABS 8.1*  --   --   --   --  19.3*  HGB 7.9*   < > 6.7*   < > 9.9* 9.9*  HCT 26.8*   < > 21.3*   < > 29.0* 30.0*  MCV 91.8   < > 92.6  --   --  88.8  PLT 352   < > 319  --   --  254   < > = values in this interval not displayed.   Basic Metabolic Panel Recent Labs    02/27/2020 0407 02/06/2020 0632 01/30/2020 1725 01/31/2020 1851 02/13/2020 1955 02/16/2020 2004 02/03/20 0420 02/03/20 0429  NA 132*   < > 133*   < > 133*   < > 135 134*  K 4.5   < > 4.2   < > 4.3   < > 4.7 4.6  CL 99  --  102  --  102  --   --  102  CO2 24  --  23  --  21*  --   --  22  GLUCOSE 116*  --  203*  --  185*  --   --  188*  BUN 44*  --  39*  --  36*  --   --  31*  CREATININE 1.46*  --  1.65*  --  1.55*  --   --  1.37*  CALCIUM 8.4*  --  8.1*  --  8.3*  --   --  7.8*  MG 2.4  --   --   --   --   --   --  2.2  PHOS 2.0*  --  2.8  --   --   --   --  3.7   < > = values in this interval not displayed.   Liver Function Tests Recent Labs    02/01/20 0415 02/01/20 1759 02/28/2020 1725 02/03/20 0429  AST 62*  63*  --   --   --   ALT 46*  46*  --   --   --   ALKPHOS 229*  228*  --   --   --    BILITOT 0.7  1.0  --   --   --   PROT 6.1*  5.8*  --   --   --   ALBUMIN 2.0*  1.9*   < > 1.7* 1.9*   < > = values in this interval not displayed.   No results for input(s): LIPASE, AMYLASE in the last 72 hours. Cardiac Enzymes No results for input(s): CKTOTAL, CKMB, CKMBINDEX, TROPONINI in the last 72 hours.  BNP: BNP (last 3 results) Recent Labs    01/24/2020 1619  BNP 577.5*    ProBNP (last 3 results) No results for input(s): PROBNP in the last 8760 hours.   D-Dimer No results for input(s): DDIMER in the last 72 hours. Hemoglobin A1C No results for input(s): HGBA1C in the last 72 hours. Fasting Lipid Panel No results for input(s): CHOL, HDL, LDLCALC, TRIG, CHOLHDL, LDLDIRECT in the last 72 hours. Thyroid Function Tests No results for input(s): TSH, T4TOTAL, T3FREE, THYROIDAB in the last 72 hours.  Invalid input(s): FREET3  Other results:   Imaging    DG Chest 1 View  Result Date: 01/29/2020 CLINICAL DATA:  History of open heart surgery EXAM: CHEST  1 VIEW COMPARISON:  02/01/2020 FINDINGS: Endotracheal tube in good position. Impella device overlying the left ventricle region. AICD unchanged in position. Right jugular central venous catheter tip in the mid SVC. Bilateral chest tubes in place. No pneumothorax. Right arm PICC tip enters SVC with the tip not visualized. Cardiac enlargement with slight vascular congestion. Improvement in bibasilar atelectasis. Small left effusion. IMPRESSION: Support lines unchanged in position. Improved aeration in the lung bases.  No pneumothorax. Electronically Signed   By: Franchot Gallo M.D.   On: 02/19/2020 16:16   DG CHEST PORT 1 VIEW  Result Date: 02/03/2020 CLINICAL DATA:  Respiratory failure EXAM: PORTABLE CHEST 1 VIEW COMPARISON:  02/15/2020 FINDINGS: Endotracheal tube seen 4 cm above the carina. Nasoenteric feeding tube extends into the upper abdomen beyond the margin of the examination. Right internal jugular hemodialysis  catheter tip at the superior cavoatrial junction. Right subclavian left ventricular assist device appears slightly withdrawn, though this likely still crosses the aortic valve plane. Median sternotomy wires have been removed in the interval. Loculated right upper lobe pleural fluid has developed. Single right and dual left chest tubes are in place. No pneumothorax. Mild interstitial pulmonary edema has progressed slightly within the perihilar regions, likely cardiogenic in nature. Moderate cardiomegaly appears unchanged peer IMPRESSION: Interval removal of the a sternotomy wires compatible with given history of sternal debridement. Moderate loculated pleural effusion at the right apex, possibly the residua of sternal washout. Slight interval retraction of the left ventricular assist device still likely crossing the aortic valve plane. Progressive mild cardiogenic failure. Bilateral chest tubes in place.  No pneumothorax. Electronically Signed   By: Fidela Salisbury MD   On: 02/03/2020 05:27   ECHOCARDIOGRAM LIMITED  Result Date: 02/26/2020    ECHOCARDIOGRAM LIMITED REPORT   Patient Name:   LEONARDO MAKRIS Date of Exam: 02/28/2020 Medical Rec #:  [id number]     Height:       75.0 in Accession #:    [id number]    Weight:       224.4 lb Date of Birth:  1970/07/17     BSA:          2.305 m Patient Age:    47 years      BP:           96/80 mmHg Patient Gender: M             HR:           85 bpm. Exam Location:  Inpatient Procedure: Limited Echo Indications:    CHF-Acute Systolic R11.65 impella placement  History:        Patient has prior history of Echocardiogram examinations, most                 recent 01/28/2020. Prior CABG and Defibrillator,                 Arrythmias:Atrial Fibrillation; Risk Factors:Non-Smoker and                 Diabetes. Cardiogenic shock.  Sonographer:    Leavy Cella Referring Phys: [place]  1. 2D echo done for Impella placement. Final placement demonstrates tip of  catheter 4.42cm from aortic valve cusps.. Left ventricular ejection fraction, by estimation, is 20 to 25%. The left ventricle has severely decreased function. The left ventricle demonstrates global hypokinesis. FINDINGS  Left Ventricle: 2D echo done for Impella placement. Final placement demonstrates tip of catheter 4.42cm from aortic valve cusps. Left ventricular ejection fraction, by estimation, is 20 to 25%. The left ventricle has severely decreased function. The left ventricle demonstrates global hypokinesis. Fransico Him MD Electronically signed by Fransico Him MD Signature Date/Time: 02/12/2020/12:36:36 PM    Final      Medications:     Scheduled Medications: . artificial tears  1 application Both Eyes B9U  . vitamin C  500 mg Per Tube TID  . aspirin  324 mg Per Tube Daily  . atorvastatin  80 mg Per Tube Daily  . busPIRone  10 mg Per Tube TID  . chlorhexidine  gluconate (MEDLINE KIT)  15 mL Mouth Rinse BID  . Chlorhexidine Gluconate Cloth  6 each Topical Daily  . [START ON 02/05/2020] darbepoetin (ARANESP) injection - NON-DIALYSIS  100 mcg Subcutaneous Q Sat-1800  . docusate  100 mg Per Tube BID  . feeding supplement (PROSource TF)  45 mL Per Tube TID  . fiber  1 packet Per Tube BID  . FLUoxetine  40 mg Per Tube BID  . lidocaine  1 patch Transdermal Q24H  . mouth rinse  15 mL Mouth Rinse 10 times per day  . melatonin  5 mg Per Tube Daily  . midodrine  15 mg Per Tube TID WC  . multivitamin  1 tablet Per Tube QHS  . oxyCODONE  10 mg Per Tube Q8H  . pantoprazole sodium  40 mg Per Tube Daily  . polyethylene glycol  17 g Per Tube Daily  . QUEtiapine  50 mg Per Tube BID  . sildenafil  20 mg Per Tube TID  . sodium chloride flush  10-40 mL Intracatheter Q12H  . sodium chloride flush  3 mL Intravenous Q12H  . thiamine  100 mg Per Tube Daily  . valproic acid  250 mg Per Tube BID    Infusions: .  prismasol BGK 4/2.5 500 mL/hr at 02/03/20 0149  .  prismasol BGK 4/2.5 200 mL/hr at 02/20/2020  1600  . sodium chloride Stopped (01/29/20 0955)  . amiodarone 30 mg/hr (02/03/20 0600)  . cisatracurium (NIMBEX) infusion 2 mg/mL 3.5 mcg/kg/min (02/03/20 0600)  . dextrose 5 % Impella PURGE solution    . electrolyte-A 100 mL/hr at 02/03/20 0600  . epinephrine 10 mcg/min (02/03/20 0600)  . feeding supplement (PIVOT 1.5 CAL) Stopped (02/21/2020 0004)  . fentaNYL infusion INTRAVENOUS 275 mcg/hr (02/03/20 0600)  . insulin 1.8 mL/hr at 02/03/20 0600  . lactated ringers 20 mL/hr at 02/03/20 0600  . meropenem (MERREM) IV 1 g (02/03/20 0615)  . midazolam 6 mg/hr (02/03/20 0600)  . milrinone 0.25 mcg/kg/min (02/03/20 0600)  . norepinephrine (LEVOPHED) Adult infusion 68 mcg/min (02/03/20 0600)  . prismasol BGK 4/2.5 1,500 mL/hr at 02/03/20 0509  . vancomycin    . vasopressin 0.04 Units/min (02/03/20 0702)    PRN Medications: sodium chloride, artificial tears, bisacodyl **OR** bisacodyl, dextrose, docusate, fentaNYL, fentaNYL (SUBLIMAZE) injection, fentaNYL (SUBLIMAZE) injection, heparin, heparin, metoprolol tartrate, midazolam, ondansetron (ZOFRAN) IV, polyethylene glycol, sodium chloride flush, sodium chloride flush     Assessment/Plan   1. Cardiogenic shock/acute on chronic systolic CHF: Initially nonischemic cardiomyopathy. Possible familial cardiomyopathy as both parents had cardiomyopathy and died at around 11.However, [employer] gene testing did not show any common mutation for cardiomyopathy.  However, this admission noted to have severe 3 vessel disease so suspect component of ischemic cardiomyopathy. St Jude ICD. Echo in 8/20 with EF 15% and mildly decreased RV function. Echo this admission with EF < 20%, moderate LV dilation, RV mildly reduced, severe LAE, no significant MR.Low output HF with markedly low EF.  He has a long history of cardiomyopathy (20 yrs), tends to minimize symptoms.  Cardiorenal syndrome with creatinine up to 2.2,  stabilized on milrinone and Impella 5.5. SCr 1.44  day of CABG. CABG 12/15.  Post-op shock, back to OR 12/16 to open chest (chest wall compartment syndrome) and again to evacuate hematoma.  TEE post-op with severe RV dysfunction.  CVVHD initiated 12/19 for volume removal in setting of rising Scr/decreased UOP. Suspect ATN. S/p chest closure 12/23. Hemodynamics much worse on 12/24 with rising pressor demands.  Co-ox 36%.  Bedside echo severe biventricular dysfunction with marked septal bounce. Chest re-opened 12/24.  Was atrial paced back to NSR on 12/26 with improvement in hemodynamics but back in atrial fibrillation on 12/27 and unable to pace out, DCCV to NSR on 12/28. Went back to OR 1/5 for chest closure but several hours later required emergent re-opening of chest at bedside to evacuate hematoma. Remains Tenuous. Maxed out on pressor support. On NE 80, VP 0.04, Epi 10. Milrinone 0.25. Impella at P-8. Flow 4.3. No alarms. CVP 7-8, remains edematous. Currently unable to pull CVVH.  - D/w PCCM. Will try to wean sedation to see if we can wean pressors - Continue Impella at P-8 - Check co-ox  2. Atrial fibrillation: H/o PAF.  He was on dofetilide in the remote past but this was stopped due to noncompliance. He had an upper GI bleed from antral ulcers in 3/12. He was seen by GI and was deemed safe to restart anticoagulation as long as he remains on a PPI.  No apparent recurrence of AF until just prior to this admission, was cardioverted back to NSR in ER but went back into atrial fibrillation.  Post-op afib, DCCV to NSR/BiV pacing am 12/16 -> back to AF. Rapid atrial pacing back to SR on 12/26 -> back in AF on 12/27 and unable to pace out. 12/28 DCCV to NSR.  Maintaining NSR.  - Continue IV amiodarone 30 mg/hr while on pressors.  - Currently off systemic heparin  - Did not have Maze with CABG.    3. CAD:   Cath this admission with severe 3 vessel disease. CABG x 4 on 12/15.  - Continue atorvastatin, ASA.  - continue current management 4. Acute on chronic  hypoxemic respiratory failure: Vent per CCM.  - CCM following - Will need tracheostomy when chest fully closed.  5. AKI on CKD stage 3: Likely ATN/cardiorenal.  On CVVH.   Per Dr Hollie Salk.     - CVP 7 today.  -  No urine output.  6. Diabetes: Insulin.  7. Hyponatremia: 134  8. Torsades/ICD shock: 12/7/212 in hospital event, in setting of severe hypokalemia and hypomagnesemia.   9. Gout: h/o gout. Complained of rt knee pain c/w previous flares - treated w/ prednisone burst (completed).  10: ID: Treating for PNA and also with chest wall open.   - Afebrile.  Check blood cultures x2. No GTD.   - 1/4 restarted vancomycin and meropenum.  11. FEN: Off tube feeds for surgery today.  Prealbumin 7 12. Anemia: Given 1u PRBCs 01/30/19 Hgb 7.5>8.1 >9.9 >7.9>.9.9  13. Stage II Pressure Ulcer- sacrum -Reposition every 2 hours R/L   Brittainy Simmons PA-C 02/03/2020 7:13 AM   Patient seen with NP, agree with the above note.   Yesterday chest wall was closed but developed worsening hemodynamics and had to re-open.  Initially did better but now back on max pressors.  Arterial line with MAP low 60s but cuff consistently better.  He remains paralyzed with Nimbex.   Impella functioning stably, on P8. Co-ox 75%, CVP 8. CVVH with no UF currently.  Weight up from OR yesterday. He is on NE 80, epinephrine 10, vasopressin 0.04, milrinone 0.25.   In NSR on amiodarone gtt.   Off heparin with bleeding and re-opening of chest.   Afebrile, on vancomycin/meropenem.   General: Sedated/paralyzed.  Neck: JVP 8 cm, no thyromegaly or thyroid nodule.  Lungs: Decreased at bases.  CV: Chest wall open.  Heart regular S1/S2,  no S3/S4, no murmur.  1+ edema to knees.   Abdomen: Soft, nontender, no hepatosplenomegaly, no distention.  Skin: Intact without lesions or rashes.  Neurologic: Sedated/paralyzed.  Extremities: No clubbing or cyanosis.  HEENT: Normal.   Consistently higher MAP off cuff versus arterial line =>  old arterial line, waveform not great.  Will switch to left arm. Continue current pressors and inotropes. Suspect component of septic/distributive shock, he is on vancomycin/meropenem.  Dr. [name] to change chest dressings today.  Will reassess in pm to restart heparin in Impella purge. Wean sedation after dressing change to see if this helps pressure.   Impella appears to be functioning appropriately.  Check position under echo today.   CVVH currently without net UF given unstable MAP.  Will eventually need to pull.   Hgb stable this morning 9.9.   New right apex loculated pleural effusion, may need to look by CT when more stable.   CRITICAL CARE Performed by: Loralie Champagne  Total critical care time: 40 minutes  Critical care time was exclusive of separately billable procedures and treating other patients.  Critical care was necessary to treat or prevent imminent or life-threatening deterioration.  Critical care was time spent personally by me on the following activities: development of treatment plan with patient and/or surrogate as well as nursing, discussions with consultants, evaluation of patient's response to treatment, examination of patient, obtaining history from patient or surrogate, ordering and performing treatments and interventions, ordering and review of laboratory studies, ordering and review of radiographic studies, pulse oximetry and re-evaluation of patient's condition.  Loralie Champagne 02/03/2020 8:15 AM

## 2020-02-03 NOTE — Progress Notes (Signed)
Nutrition Follow-up  DOCUMENTATION CODES:   Not applicable  INTERVENTION:   Tube Feeding via Cortrak: -Pivot 1.5 to70 ml/hrvia Cortrak -Pro-Source TF45 mlTID [id number] of protein, 2640kcals and 1225mL of free water  Continue Renal MVI  Risk for multiple micronutrient deficiencies; Check Copper, Zinc, Vit C, folate  NUTRITION DIAGNOSIS:   Inadequate oral intake related to acute illness as evidenced by NPO status.  Being addressed via TF   GOAL:   Patient will meet greater than or equal to 90% of their needs  Progressing  MONITOR:   Weight trends,Vent status  REASON FOR ASSESSMENT:   Ventilator    ASSESSMENT:   50 yo male admitted with Afib with RVR requiring cardioversion, ischemic CM requiring inotropes and impella support and ultimately underwent CABG x 4 on 12/15 with Impella still in place. PMH includes CAD, benign stomach cancer, CHF, DM, depression, CM, GERD, HTN  12/11 Impella LVAD placed 12/15 Redo Sternotomy, CABG on pump x 4, Intubated 12/16 Bedside chest exploration for tamponade with moderate amount of anterior mediastinal clot removed, OR for bleeding s/p CABG for mediastinal washout 12/17 Cortrak placed-duodenal  12/19 CRRT initiated 12/23 Sternal closure  12/24 Worsening shock, Chest reopened at bedside 12/28 OR-Wash[place], Wound VAC 01/04 Bedside washout 01/05 OR for sternal washout and closure, reopened later due to compromised hemodynamics due to mediastinal hematoma, wound VAC replaced  Pt remains on vent support, sedated, not currently paralyzed. Continues to require Impella support. Remains on levophed, epinephrine  CRRT x 18 days; high risk for micronutrient deficiencies  Cortrak tube noted to be coiled in patient mouth, RN notified this RD and this RD pulled back on Cortrak to remove coil and then Cortrak re-advanced and clipped in place. Xray obtained to confirm placement  Previously tolerating Pivot 1.5 at 70 ml/hr. Plan to  resume today  Wound VAC: 825 mL in 24 hours, CT with 300 mL  Labs: reviewed Meds: Rena-Vite, Vit C 500 mg TID  Diet Order:   Diet Order            Diet NPO time specified  Diet effective midnight                 EDUCATION NEEDS:   Not appropriate for education at this time  Skin:  Skin Assessment: Skin Integrity Issues: Skin Integrity Issues:: Incisions,Other (Comment) Incisions: stermun, chest tubes x 4 Other: venous stasis ulcers  Last BM:  12/30 rectal tube  Height:   Ht Readings from Last 1 Encounters:  01/22/2020 6\' 3"  (1.905 m)    Weight:   Wt Readings from Last 1 Encounters:  02/03/20 106.5 kg    Ideal Body Weight:  89 kg  BMI:  Body mass index is 29.35 kg/m.  Estimated Nutritional Needs:   Kcal:  2500-2800 kcals  Protein:  180-200 grams  Fluid:  >/= 1.5 L    Kerman Passey MS, RDN, LDN, CNSC Registered Dietitian III Clinical Nutrition RD Pager and On-Call Pager Number Located in [place]

## 2020-02-03 NOTE — Progress Notes (Addendum)
[place] for Heparin Indication: Impella 5.5  Allergies  Allergen Reactions  . Orange Fruit Anaphylaxis, Hives and Other (See Comments)    Per Pt- Blisters around lips and Hives all over, also  . Penicillins Anaphylaxis    Did it involve swelling of the face/tongue/throat, SOB, or low BP? Yes Did it involve sudden or severe rash/hives, skin peeling, or any reaction on the inside of your mouth or nose? Yes Did you need to seek medical attention at a hospital or doctor's office? No When did it last happen?childhood If all above answers are "NO", may proceed with cephalosporin use.  [name] [Empagliflozin] Itching  . [name] [Insulin Glargine] Nausea And Vomiting    Patient Measurements: Height: 6\' 3"  (190.5 cm) Weight: 106.5 kg (234 lb 12.6 oz) IBW/kg (Calculated) : 84.5 Heparin dosing wt: 101 kg  Vital Signs: Temp: 97.2 F (36.2 C) (01/06 0600) Temp Source: Core (01/06 0600) BP: 90/68 (01/06 0600) Pulse Rate: 88 (01/06 0630)  Labs: Recent Labs    02/01/20 0415 02/01/20 0422 02/03/2020 0407 02/16/2020 5366 02/06/2020 1725 02/21/2020 1851 02/18/2020 1955 02/13/2020 2004 02/16/2020 2116 02/24/2020 2123 02/09/2020 2231 02/03/20 0004 02/03/20 0420 02/03/20 0429  HGB 8.2*   < > 7.9*   < > 6.8*   < >  --    < > 8.0*   < > 6.7* 7.8* 9.9* 9.9*  HCT 26.5*   < > 26.8*   < > 23.1*   < >  --    < > 25.2*   < > 21.3* 23.0* 29.0* 30.0*  PLT 373  --  352  --  489*  --   --   --  402*  --  319  --   --  254  LABPROT  --   --   --   --   --   --  17.1*  --   --   --  17.4*  --   --  16.7*  INR  --   --   --   --   --   --  1.5*  --   --   --  1.5*  --   --  1.4*  HEPARINUNFRC <0.10*  --  <0.10*  --   --   --   --   --   --   --   --   --   --  <0.10*  CREATININE 1.70*   < > 1.46*  --  1.65*  --  1.55*  --   --   --   --   --   --  1.37*   < > = values in this interval not displayed.    Estimated Creatinine Clearance: 84.2 mL/min (A) (by C-G  formula based on SCr of 1.37 mg/dL (H)).   Assessment: 50 yo male s/p impella 5.5 placement. Pt started on heparinized purge and systemic heparin. Pt now s/p CABG 12/15 with Impella remaining in place c/b significant bleeding and hematoma requiring opened chest and reexploration in OR 12/16. Chest closed on 12/23, re-opened on 12/24. Underwent cardioversion in OR 12/28, s/p sternal wash out/application of wound VAC.   Yesterday underwent sternal washout, wound VAC change, followed by sternal closure on 1/5. Chest was re-opened in evening given compromised hemodynamics.   Impella on P8 with stable purge pressures (400-500), purge flow rate 7.9 ml/hr (only on dextrose impella purge currently). Hgb 9.9, plt 254, fibrinogen 468, LDH 316.  Goal of  Therapy:  Heparin level 0.2-0.3 units/ml Monitor platelets by anticoagulation protocol: Yes   Plan:  Will continue dextrose impella purge for now Plan to reassess this afternoon and consider adding heparin back to purge solution Daily heparin level and CBC.  [name], PharmD, [place] Clinical Pharmacist  Phone: [phone] 02/03/2020 7:21 AM  Please check AMION for all [facility] phone numbers After 10:00 PM, call [place]   ADDENDUM Discussed with Dr [name] - okay to restart heparin purge solution. Will order full concentration purge solution. Currently on P8, purge pressure stable 400-600s, purge flow rate 6.6 mL/hr (will be ~330 units/hr of heparin from purge). No systemic heparin at this time. Will continue to monitor s/sx of bleeding, CBC, and HL daily.   [name], PharmD, [place] Clinical Pharmacist

## 2020-02-03 NOTE — Progress Notes (Signed)
  Echocardiogram 2D Echocardiogram has been performed.  Corey Palmer 02/03/2020, 10:40 AM

## 2020-02-04 ENCOUNTER — Inpatient Hospital Stay (HOSPITAL_COMMUNITY): Payer: BC Managed Care – PPO

## 2020-02-04 DIAGNOSIS — J9601 Acute respiratory failure with hypoxia: Secondary | ICD-10-CM | POA: Diagnosis not present

## 2020-02-04 DIAGNOSIS — Z9911 Dependence on respirator [ventilator] status: Secondary | ICD-10-CM | POA: Diagnosis not present

## 2020-02-04 DIAGNOSIS — Z95811 Presence of heart assist device: Secondary | ICD-10-CM

## 2020-02-04 DIAGNOSIS — R570 Cardiogenic shock: Secondary | ICD-10-CM | POA: Diagnosis not present

## 2020-02-04 LAB — POCT I-STAT, CHEM 8
BUN: 27 mg/dL — ABNORMAL HIGH (ref 6–20)
Calcium, Ion: 1.16 mmol/L (ref 1.15–1.40)
Chloride: 103 mmol/L (ref 98–111)
Creatinine, Ser: 1.6 mg/dL — ABNORMAL HIGH (ref 0.61–1.24)
Glucose, Bld: 213 mg/dL — ABNORMAL HIGH (ref 70–99)
HCT: 22 % — ABNORMAL LOW (ref 39.0–52.0)
Hemoglobin: 7.5 g/dL — ABNORMAL LOW (ref 13.0–17.0)
Potassium: 4.9 mmol/L (ref 3.5–5.1)
Sodium: 135 mmol/L (ref 135–145)
TCO2: 25 mmol/L (ref 22–32)

## 2020-02-04 LAB — RENAL FUNCTION PANEL
Albumin: 1.9 g/dL — ABNORMAL LOW (ref 3.5–5.0)
Albumin: 1.9 g/dL — ABNORMAL LOW (ref 3.5–5.0)
Anion gap: 7 (ref 5–15)
Anion gap: 8 (ref 5–15)
BUN: 25 mg/dL — ABNORMAL HIGH (ref 6–20)
BUN: 28 mg/dL — ABNORMAL HIGH (ref 6–20)
CO2: 24 mmol/L (ref 22–32)
CO2: 23 mmol/L (ref 22–32)
Calcium: 8.2 mg/dL — ABNORMAL LOW (ref 8.9–10.3)
Calcium: 8 mg/dL — ABNORMAL LOW (ref 8.9–10.3)
Chloride: 105 mmol/L (ref 98–111)
Chloride: 103 mmol/L (ref 98–111)
Creatinine, Ser: 1.57 mg/dL — ABNORMAL HIGH (ref 0.61–1.24)
Creatinine, Ser: 1.59 mg/dL — ABNORMAL HIGH (ref 0.61–1.24)
GFR, Estimated: 53 mL/min — ABNORMAL LOW (ref >60)
GFR, Estimated: 52 mL/min — ABNORMAL LOW (ref >60)
Glucose, Bld: 175 mg/dL — ABNORMAL HIGH (ref 70–99)
Glucose, Bld: 192 mg/dL — ABNORMAL HIGH (ref 70–99)
Phosphorus: 4.3 mg/dL (ref 2.5–4.6)
Phosphorus: 4 mg/dL (ref 2.5–4.6)
Potassium: 5.2 mmol/L — ABNORMAL HIGH (ref 3.5–5.1)
Potassium: 4.6 mmol/L (ref 3.5–5.1)
Sodium: 136 mmol/L (ref 135–145)
Sodium: 134 mmol/L — ABNORMAL LOW (ref 135–145)

## 2020-02-04 LAB — CBC
HCT: 24 % — ABNORMAL LOW (ref 39.0–52.0)
Hemoglobin: 7.6 g/dL — ABNORMAL LOW (ref 13.0–17.0)
MCH: 29.1 pg (ref 26.0–34.0)
MCHC: 31.7 g/dL (ref 30.0–36.0)
MCV: 92 fL (ref 80.0–100.0)
Platelets: 190 10*3/uL (ref 150–400)
RBC: 2.61 MIL/uL — ABNORMAL LOW (ref 4.22–5.81)
RDW: 18 % — ABNORMAL HIGH (ref 11.5–15.5)
WBC: 15.5 10*3/uL — ABNORMAL HIGH (ref 4.0–10.5)
nRBC: 0.5 % — ABNORMAL HIGH (ref 0.0–0.2)

## 2020-02-04 LAB — GLUCOSE, CAPILLARY
Glucose-Capillary: 143 mg/dL — ABNORMAL HIGH (ref 70–99)
Glucose-Capillary: 155 mg/dL — ABNORMAL HIGH (ref 70–99)
Glucose-Capillary: 159 mg/dL — ABNORMAL HIGH (ref 70–99)
Glucose-Capillary: 159 mg/dL — ABNORMAL HIGH (ref 70–99)
Glucose-Capillary: 161 mg/dL — ABNORMAL HIGH (ref 70–99)
Glucose-Capillary: 161 mg/dL — ABNORMAL HIGH (ref 70–99)
Glucose-Capillary: 166 mg/dL — ABNORMAL HIGH (ref 70–99)
Glucose-Capillary: 166 mg/dL — ABNORMAL HIGH (ref 70–99)
Glucose-Capillary: 166 mg/dL — ABNORMAL HIGH (ref 70–99)
Glucose-Capillary: 168 mg/dL — ABNORMAL HIGH (ref 70–99)
Glucose-Capillary: 168 mg/dL — ABNORMAL HIGH (ref 70–99)
Glucose-Capillary: 169 mg/dL — ABNORMAL HIGH (ref 70–99)
Glucose-Capillary: 172 mg/dL — ABNORMAL HIGH (ref 70–99)
Glucose-Capillary: 173 mg/dL — ABNORMAL HIGH (ref 70–99)
Glucose-Capillary: 191 mg/dL — ABNORMAL HIGH (ref 70–99)
Glucose-Capillary: 172 mg/dL — ABNORMAL HIGH (ref 70–99)
Glucose-Capillary: 181 mg/dL — ABNORMAL HIGH (ref 70–99)
Glucose-Capillary: 191 mg/dL — ABNORMAL HIGH (ref 70–99)
Glucose-Capillary: 201 mg/dL — ABNORMAL HIGH (ref 70–99)
Glucose-Capillary: 207 mg/dL — ABNORMAL HIGH (ref 70–99)
Glucose-Capillary: 202 mg/dL — ABNORMAL HIGH (ref 70–99)
Glucose-Capillary: 190 mg/dL — ABNORMAL HIGH (ref 70–99)
Glucose-Capillary: 202 mg/dL — ABNORMAL HIGH (ref 70–99)
Glucose-Capillary: 200 mg/dL — ABNORMAL HIGH (ref 70–99)
Glucose-Capillary: 206 mg/dL — ABNORMAL HIGH (ref 70–99)
Glucose-Capillary: 163 mg/dL — ABNORMAL HIGH (ref 70–99)
Glucose-Capillary: 176 mg/dL — ABNORMAL HIGH (ref 70–99)
Glucose-Capillary: 179 mg/dL — ABNORMAL HIGH (ref 70–99)
Glucose-Capillary: 207 mg/dL — ABNORMAL HIGH (ref 70–99)
Glucose-Capillary: 186 mg/dL — ABNORMAL HIGH (ref 70–99)

## 2020-02-04 LAB — CBC WITH DIFFERENTIAL/PLATELET
Abs Immature Granulocytes: 0.44 10*3/uL — ABNORMAL HIGH (ref 0.00–0.07)
Basophils Absolute: 0.1 10*3/uL (ref 0.0–0.1)
Basophils Relative: 1 %
Eosinophils Absolute: 0.5 10*3/uL (ref 0.0–0.5)
Eosinophils Relative: 3 %
HCT: 23.8 % — ABNORMAL LOW (ref 39.0–52.0)
Hemoglobin: 7.7 g/dL — ABNORMAL LOW (ref 13.0–17.0)
Immature Granulocytes: 2 %
Lymphocytes Relative: 7 %
Lymphs Abs: 1.3 10*3/uL (ref 0.7–4.0)
MCH: 29.4 pg (ref 26.0–34.0)
MCHC: 32.4 g/dL (ref 30.0–36.0)
MCV: 90.8 fL (ref 80.0–100.0)
Monocytes Absolute: 2.2 10*3/uL — ABNORMAL HIGH (ref 0.1–1.0)
Monocytes Relative: 12 %
Neutro Abs: 14.7 10*3/uL — ABNORMAL HIGH (ref 1.7–7.7)
Neutrophils Relative %: 75 %
Platelets: 231 10*3/uL (ref 150–400)
RBC: 2.62 MIL/uL — ABNORMAL LOW (ref 4.22–5.81)
RDW: 18.2 % — ABNORMAL HIGH (ref 11.5–15.5)
WBC: 19.2 10*3/uL — ABNORMAL HIGH (ref 4.0–10.5)
nRBC: 0.4 % — ABNORMAL HIGH (ref 0.0–0.2)

## 2020-02-04 LAB — POCT I-STAT 7, (LYTES, BLD GAS, ICA,H+H)
Acid-Base Excess: 1 mmol/L (ref 0.0–2.0)
Acid-Base Excess: 2 mmol/L (ref 0.0–2.0)
Bicarbonate: 25.4 mmol/L (ref 20.0–28.0)
Bicarbonate: 26.6 mmol/L (ref 20.0–28.0)
Calcium, Ion: 1.17 mmol/L (ref 1.15–1.40)
Calcium, Ion: 1.14 mmol/L — ABNORMAL LOW (ref 1.15–1.40)
HCT: 22 % — ABNORMAL LOW (ref 39.0–52.0)
HCT: 21 % — ABNORMAL LOW (ref 39.0–52.0)
Hemoglobin: 7.5 g/dL — ABNORMAL LOW (ref 13.0–17.0)
Hemoglobin: 7.1 g/dL — ABNORMAL LOW (ref 13.0–17.0)
O2 Saturation: 96 %
O2 Saturation: 96 %
Patient temperature: 36.7
Patient temperature: 37
Potassium: 5.1 mmol/L (ref 3.5–5.1)
Potassium: 5 mmol/L (ref 3.5–5.1)
Sodium: 136 mmol/L (ref 135–145)
Sodium: 136 mmol/L (ref 135–145)
TCO2: 27 mmol/L (ref 22–32)
TCO2: 28 mmol/L (ref 22–32)
pCO2 arterial: 39.6 mmHg (ref 32.0–48.0)
pCO2 arterial: 42.6 mmHg (ref 32.0–48.0)
pH, Arterial: 7.414 (ref 7.350–7.450)
pH, Arterial: 7.404 (ref 7.350–7.450)
pO2, Arterial: 80 mmHg — ABNORMAL LOW (ref 83.0–108.0)
pO2, Arterial: 80 mmHg — ABNORMAL LOW (ref 83.0–108.0)

## 2020-02-04 LAB — LACTATE DEHYDROGENASE: LDH: 332 U/L — ABNORMAL HIGH (ref 98–192)

## 2020-02-04 LAB — ECHOCARDIOGRAM LIMITED
Height: 75 in
Weight: 3834.24 oz

## 2020-02-04 LAB — PREPARE RBC (CROSSMATCH)

## 2020-02-04 LAB — COOXEMETRY PANEL
Carboxyhemoglobin: 1.7 % — ABNORMAL HIGH (ref 0.5–1.5)
Methemoglobin: 0.9 % (ref 0.0–1.5)
O2 Saturation: 78.2 %
Total hemoglobin: 8.6 g/dL — ABNORMAL LOW (ref 12.0–16.0)

## 2020-02-04 LAB — HEPARIN LEVEL (UNFRACTIONATED): Heparin Unfractionated: <0.1 IU/mL — ABNORMAL LOW (ref 0.30–0.70)

## 2020-02-04 LAB — MAGNESIUM: Magnesium: 2.5 mg/dL — ABNORMAL HIGH (ref 1.7–2.4)

## 2020-02-04 MED ORDER — INSULIN REGULAR(HUMAN) IN NACL 100-0.9 UT/100ML-% IV SOLN
INTRAVENOUS | Status: DC
  Administered 2020-02-04 – 2020-02-05 (×2): 3.8 [IU]/h via INTRAVENOUS
  Administered 2020-02-08: 1.3 [IU]/h via INTRAVENOUS
  Administered 2020-02-09: 3.8 [IU]/h via INTRAVENOUS
  Administered 2020-02-12: 0.7 [IU]/h via INTRAVENOUS
  Administered 2020-02-13: 6.4 [IU]/h via INTRAVENOUS
  Administered 2020-02-14: 2 [IU]/h via INTRAVENOUS
  Filled 2020-02-04 (×10): qty 100

## 2020-02-04 MED ORDER — SODIUM CHLORIDE 0.9% IV SOLUTION: Freq: Once | INTRAVENOUS | Status: AC

## 2020-02-04 NOTE — Progress Notes (Signed)
NAME:  Corey Palmer, MRN:  [id number], DOB:  1970/03/31, LOS: 49 ADMISSION DATE:  12/30/2019, CONSULTATION DATE: 01/04/2020 REFERRING MD: Aundra Dubin, CHIEF COMPLAINT: Respiratory failure post Impella  HPI/course in hospital  50 year old man admitted with A. fib/RVR, torsades, cardiogenic shock [EF 15] requiring Impella, CABG x5, AKI requiring initiation of CRRT  Past Medical History:    has a past medical history of Anginal pain ([place]), Anxiety, Arthritis, Automatic implantable cardioverter-defibrillator in situ (2007), Bipolar disorder ([place]), CAD (coronary artery disease), Cancer ([place]) (2013), CHF (congestive heart failure) ([place]), Chondrocalcinosis of right knee (07/31/5007), Chronic systolic heart failure ([place]), Depression, Diabetes uncomplicated adult-type II, Dilated cardiomyopathy ([place]), Dyslipidemia, Dysrhythmia, GERD (gastroesophageal reflux disease), Gout, unspecified, Hepatic steatosis (2011), History of kidney stones, echocardiogram, Hypertension, Obesity (BMI 30.0-34.9), Pacemaker, Paroxysmal atrial fibrillation ([place]), Peptic ulcer, Shortness of breath, and Sleep apnea.  Significant Hospital Events:  12/2 Afib with RVR. Cardioverted in ED. AICD did not fire. Milrinone for low co ox.  12/7 ICD fired, Torsades- likely from low K of 2.8.  12/8 Cath- multivessel disease w/ low CO. Volume overloaded.  12/11 Underwent Impella 5.5 insertion for persistent symptoms of forward failure with low cardiac indices. 12/12 Back to the OR for displacement of Impella. Extubated.  12/15 CABG x4, with LIMA-LAD, SVG-PDA/PLV, SVG-OM 12/16 Emergently opened chest at bedside for tamponade, clot compressing right atrium  12/16 To OR for Exploration of chest due to bleeding  12/19 Started CRRT 12/23 Sternal Closure in OR 12/24 Worsening shock.  Chest reopened at bedside with improvement in hemodynamics.  No mediastinal hematoma. 12/25 A flutter > rapid a paced to sinus rhythm.  Remains on high-dose pressors,  inotropes 12/28 Weaning vasopressor requirements.  For chest washout today.  Back in atrial fibrillation, refractory to increased amiodarone and attempted overdrive pacing.  DCCV to NSR. Tolerating fluid removal via CRRT. 1/3: Appears euvolemic.  Adjusting glycemic control.  Chest still open so not weaning 1/4, spiking fever, Vanco meropenem resumed after culture sent from blood and sputum. 1/5 awaiting return to the OR for wound VAC dressing assessment fever curve and white blood cell count improving after antibiotics resumed the day prior, did have chest closed, developed tamponade physiology shortly after had to go back for emergent reopening of chest, return to intensive care and refractory shock 1/6: Wound VAC dressing replaced.  Still in shock.  Starting to wean sedation 1/7: Still on high-dose pressors  Consults:  PCCM, nephrology, cardiology, cardiothoracic surgery  Procedures:  12/2 R PICC >> 12/11 right ax impella 5.5 >> 12/15 ETT >> 12/15 right femoral arterial sheath >> 12/19 right femoral HD >> 12/23 chest tube-right pleural, left pleural , mediastinal  >>  Significant Diagnostic Tests:    Micro Data:  Blood culture 12/14 > negative  Blood culture 12/16 > negative Respiratory culture 12/20 > rare candida albicans Sternal wound culture 12/23 > no growth Fungal cx sternal wound 12/23 >> neg Fungal cx w/stain sternal wound 12/23 >> negative AFB sternal wound 12/23 >> negative 1/4 blood culture x2>>> 1/4 respiratory culture>>> rare budding yeast Antimicrobials:  Vancomycin 12/16 >>12/31 Meropenem 12/16 >> 12/31 Vancomycin and meropenem resumed on 1/4 Interval history:  Looks the same  Objective   Blood pressure 92/67, pulse 92, temperature 97.9 F (36.6 C), temperature source Oral, resp. rate (Abnormal) 28, height 6\' 3"  (1.905 m), weight 108.7 kg, SpO2 100 %. CVP:  [5 mmHg-14 mmHg] 6 mmHg  Vent Mode: PRVC FiO2 (%):  [50 %-60 %] 50 % Set Rate:  [28  bmp] 28 bmp Vt  Set:  [510 mL] 510 mL PEEP:  [8 cmH20] 8 cmH20 Plateau Pressure:  [25 BMW41-32 cmH20] 28 cmH20   Intake/Output Summary (Last 24 hours) at 02/04/2020 0854 Last data filed at 02/04/2020 0800 Gross per 24 hour  Intake 6432.22 ml  Output 3710 ml  Net 2722.22 ml   Filed Weights   02/01/2020 0600 02/03/20 0600 02/04/20 0645  Weight: 101.8 kg 106.5 kg 108.7 kg   CVP:  [5 mmHg-14 mmHg] 6 mmHg  Examination: General 50 year old male patient remains sedated on ventilator HEENT normocephalic atraumatic no jugular venous distention Pulmonary diminished bilaterally, still on full vent support Cardiac: Still on Impella.  Requiring high-dose pressors.  Wound VAC intact wound margins look better granulated than previous exams Abdomen soft Extremities warm dry Neuro will follow commands still sedated, no focal motor deficits GU minimal output   Assessment & Plan:   Acute hypoxic and hypercapnic respiratory failure requiring mechanical ventilation following cardiac surgery. Portable chest x-ray personally reviewed endotracheal tube and support lines and tubes are all in satisfactory position. Continues to have right upper lobe apical consolidation versus loculated pleural collection, has right greater than left airspace disease this does not look much change compared to prior film Plan Continue full ventilator support Continue Seroquel and valproic acid Wean PEEP FiO2 as tolerated VAP bundle PAD protocol A.m. chest x-ray Will need trach  Right upper lobe density versus loculated fluid collection -Etiology not clear.  Certainly could be loculated hematoma Plan Deferring to cardiothoracic's  Circulatory shock due to  Vasoplegic, cardiogenic shock, following high risk CABG, status post progressive shock on 1/5 secondary to obstructive physiology from hematoma after attempted chest closure necessitating emergent reopening of chest with reevacuation Post cardiac surgery RV dysfunction CAD s/p CABG,  c/b cardiac tamponade, hematoma evac.  Open chest with VAC.   As a 1/7 continues to be in refractory shock requiring high-dose pressors Plan Keep euvolemic  Impella support per cards  Continue to titrate vasoactive drips with mean arterial pressure goal greater than 65  Continue milrinone  Aspirin and Lipitor  Volume removal  Additional recommendations per heart failure  Currently considering back to the operating room again today on 1/7  Sepsis/septic shock I am not convinced that sepsis is the primary driver at this point Plan Continue meropenem and vancomycin day #4, length of therapy yet to be determined will minimally keep this going as long as chest is open   Paroxysmal Afib Currently normal sinus Plan Continuing IV heparin Continuing IV amiodarone Continuing telemetry monitoring  Acute kidney injury with anuria; likely cardiorenal syndrome Plan On CRRT, current goal is to pull volume in effort to keep volume status even Deferring electrolyte management per nephrology  DM2 with hyperglycemia: Plan We will continue insulin infusion for blood glucose goal 140-180  Acute blood loss anemia Hemoglobin dropped to 6.7 on 1/5 requiring blood, once again down to 7.7 Plan Continue to trend daily CBC Cardiothoracic surgery as ordered another unit of blood, this seems reasonable as likely going back to the OR and current CRRT filter about to expire which will also result in some loss of blood   Daily Goals Checklist  Pain/Anxiety/Delirium protocol (if indicated): fent gtt, PRN versed [facility] oxy VAP protocol (if indicated): bundle in place. Respiratory support goals: full ventilatory support. Wean once chest closed.  DVT prophylaxis:  Heparin Nutrition Status: high nutritional risk. EN per [place]. GI prophylaxis: pantoprazole Fluid status goals: CRRT to net even fluid Urinary catheter:  n/a Central lines: PICC, PAC, HD catheter. Glucose control: insulin gtt Mobility/therapy  needs: bed rest  Daily labs: CBC, renal function, cooximetry Code Status: full  Family Communication: wife updated by primary  Disposition: ICU  My critical care time 32 minutes  Erick Colace ACNP-BC [place] Pager # [phone] OR # [phone] if no answer

## 2020-02-04 NOTE — Progress Notes (Signed)
Patient ID: Corey Palmer, male   DOB: [birth year], 50 y.o.   MRN: [id number] EVENING ROUNDS NOTE :     [address]       [place] [address]             [phone]                 2 Days Post-Op Procedure(s) (LRB): STERNAL WASHOUT (N/A) WOUND VAC CHANGE (N/A) STERNAL CLOSURE (N/A) TRANSESOPHAGEAL ECHOCARDIOGRAM (TEE) (N/A)  Total Length of Stay:  LOS: 36 days  BP (!) 85/58   Pulse 92   Temp 98.8 F (37.1 C)   Resp (!) 28   Ht 6\' 3"  (1.905 m)   Wt 108.7 kg   SpO2 100%   BMI 29.95 kg/m   .Intake/Output      01/06 0701 01/07 0700 01/07 0701 01/08 0700   I.V. (mL/kg) 4536.4 (41.7) 1473.3 (13.6)   Blood     Other 198.7 83   NG/GT 700 735   IV Piggyback 965.3 299.9   Total Intake(mL/kg) 6400.4 (58.9) 2591.3 (23.8)   Emesis/NG output     Drains 520 50   Other 2680 2375   Stool 0    Blood     Chest Tube 260    Total Output 3460 2425   Net +2940.4 +166.3          .  prismasol BGK 4/2.5 500 mL/hr at 02/03/20 0149  .  prismasol BGK 4/2.5 200 mL/hr at 02/04/20 0754  . sodium chloride Stopped (01/29/20 0955)  . sodium chloride    . amiodarone 30 mg/hr (02/04/20 1600)  . electrolyte-A Stopped (02/03/20 6237)  . epinephrine 10 mcg/min (02/04/20 1600)  . feeding supplement (PIVOT 1.5 CAL) 60 mL/hr at 02/04/20 1600  . fentaNYL infusion INTRAVENOUS 175 mcg/hr (02/04/20 1600)  . impella catheter heparin 50 unit/mL in dextrose 5%    . insulin 3.8 mL/hr at 02/04/20 1600  . lactated ringers 20 mL/hr at 02/04/20 1600  . meropenem (MERREM) IV 200 mL/hr at 02/04/20 1400  . midazolam Stopped (02/04/20 0404)  . milrinone 0.25 mcg/kg/min (02/04/20 1600)  . norepinephrine (LEVOPHED) Adult infusion 62 mcg/min (02/04/20 1600)  . prismasol BGK 4/2.5 1,500 mL/hr at 02/04/20 1335  . vancomycin Stopped (02/04/20 1506)  . vasopressin 0.04 Units/min (02/04/20 1455)     Lab Results  Component Value Date   WBC 19.2 (H) 02/04/2020   HGB 7.1 (L) 02/04/2020   HCT 21.0  (L) 02/04/2020   PLT 231 02/04/2020   GLUCOSE 213 (H) 02/04/2020   CHOL 176 10/01/2018   TRIG 126 10/01/2018   HDL 25 (L) 10/01/2018   LDLDIRECT 116.5 11/01/2011   LDLCALC 126 (H) 10/01/2018   ALT 46 (H) 02/01/2020   ALT 46 (H) 02/01/2020   AST 63 (H) 02/01/2020   AST 62 (H) 02/01/2020   NA 136 02/04/2020   K 5.0 02/04/2020   CL 103 02/04/2020   CREATININE 1.60 (H) 02/04/2020   BUN 27 (H) 02/04/2020   CO2 24 02/04/2020   TSH 3.009 10/01/2018   INR 1.4 (H) 02/03/2020   HGBA1C 10.6 (H) 01/03/2020   Still high levels of pressors Opens eyes to name  Chest open with wound vac in place 30-60 per hour fluid off    Grace Isaac MD  Beeper [phone] Office [phone] 02/04/2020 4:06 PM

## 2020-02-04 NOTE — Procedures (Signed)
Patient was seen and evaluated on CRRT. [place] negative today. K slightly high at 5.1. Maintain 4k bath for now. Remains unstable. No other changes.

## 2020-02-04 NOTE — Progress Notes (Signed)
  Echocardiogram 2D Echocardiogram has been performed.  Corey Palmer 02/04/2020, 11:33 AM

## 2020-02-04 NOTE — Progress Notes (Signed)
[facility] Progress Note    Assessment/ Plan:    50M with familial cardiomyopathy EF20% s/p redo CABG 78/67/67 complicated by persistent shock, multiple attempts at sternal closure with formation of chest hematoma and tamponade, Afib with RVR, and AKI requiring CRRT.    #AKI/CKD stage III- presumably due to ischemic ATN in setting of cardiogenic shock s/p CABG.  CRRT started 01/16/20. All fluids 4K/2.5Ca.  Right femoral HD catheter placed 01/16/20 switched to RIJ on 01/28/20.  - continue CRRT--> significantly positive yesterday.  Try to maintain slightly net negative today.  # CAD s/p CABG complicated by cardiogenic shock and post-op hematoma. secondary closure on 12/23 then had to be re opened.  Went to the OR for washout on 12/28, bedside washout 02/01/19.  OR 02/09/2020 for closure, then had emergent reopening at the bedside 02/01/2020 d/t anterior chest hematoma again. Vac foam replaced.  # Cardiogenic shock- underlying familial cardiomyopathy s/p redo CABG, EF ~20%. Remains on pressors: levophed up to 70, epi back on at 10, on milrinone as well.  Vasopressin back on today.  Midodrine 15 TID. Impella P8. Continue mgmt per primary  # ABLA- s/p postoperative bleeding into chest cavity s/p re-exploration for tamponade/hematoma. Received multiple blood products yesterday  # Atrial fibrillation - s/p DCCV on 12/16.  On amiodarone.  Normal sinus rhythm currently  # VDRF- per primary svc.  #-Leukocytosis/ new fever: cultures redrawn 02/01/20, vanc and meropenem restarted 02/01/20 (had been off since 01/28/20)  # R loculated pleural effusion: for CT chest on 1/6 with contrast which is necessary for clinical decision making.  #Dispo: critically ill in the ICU   Subjective:    Seen in room.  Ongoing pressor requirement now on vasopressin in addition to norepinephrine and epinephrine.  Significantly positive yesterday about 3 L.  Now pulling more fluid at this time but difficult  to do so.     Objective:   BP 92/78   Pulse 92   Temp 97.9 F (36.6 C) (Oral)   Resp (!) 28   Ht _0  (1.905 m)   Wt 108.7 kg   SpO2 100%   BMI 29.95 kg/m   Intake/Output Summary (Last 24 hours) at 02/04/2020 2094 Last data filed at 02/04/2020 0900 Gross per 24 hour  Intake 6448.08 ml  Output 3924 ml  Net 2524.08 ml   Weight change: 2.2 kg  Physical Exam: Gen: sedated CVS: Impella sounds obscure heart sounds CHEST: woundvac foam in place, 2 chest tubes in place, R [place] Impella Resp: coarse bilateral breath sounds Abd: no distention Ext: ++ dependent edema ACCESS: R IJ nontunneled HD cath  Imaging: DG Chest 1 View  Result Date: 02/01/2020 CLINICAL DATA:  History of open heart surgery EXAM: CHEST  1 VIEW COMPARISON:  02/01/2020 FINDINGS: Endotracheal tube in good position. Impella device overlying the left ventricle region. AICD unchanged in position. Right jugular central venous catheter tip in the mid SVC. Bilateral chest tubes in place. No pneumothorax. Right arm PICC tip enters SVC with the tip not visualized. Cardiac enlargement with slight vascular congestion. Improvement in bibasilar atelectasis. Small left effusion. IMPRESSION: Support lines unchanged in position. Improved aeration in the lung bases.  No pneumothorax. Electronically Signed   By: Franchot Gallo M.D.   On: 02/11/2020 16:16   DG CHEST PORT 1 VIEW  Result Date: 02/04/2020 CLINICAL DATA:  Open heart surgery EXAM: PORTABLE CHEST 1 VIEW COMPARISON:  02/03/2020 FINDINGS: Endotracheal tube remains in place with the tip not well visualized.  Feeding tube in place. Impella device in the left ventricle unchanged. AICD unchanged. Bilateral chest tubes unchanged.  No pneumothorax. Loculated right apical pleural fluid mildly improved from yesterday. This was not present on 02/03/2020. Question extrapleural hematoma from Impella. Bibasilar airspace disease right greater than left unchanged likely atelectasis. No pleural  effusion. IMPRESSION: Support lines remain in satisfactory position and unchanged. No pneumothorax Loculated right apical pleural fluid mildly improved. Question hematoma from Impella. Electronically Signed   By: Franchot Gallo M.D.   On: 02/04/2020 08:00   DG Chest Port 1 View  Result Date: 02/03/2020 CLINICAL DATA:  Acute respiratory failure, history coronary artery disease, CHF, diabetes mellitus, dilated cardiomyopathy, hypertension EXAM: PORTABLE CHEST 1 VIEW COMPARISON:  Portable exam 1243 hours compared to 02/03/2020 FINDINGS: Tip of endotracheal tube projects 4.7 cm above carina. Feeding tube extends into abdomen. Mediastinal drain and BILATERAL thoracostomy tubes present. RIGHT jugular central venous catheter with tip projecting over SVC. Impella catheter present. ICD via LEFT subclavian approach projects over RIGHT ventricle. Enlargement of cardiac silhouette. RIGHT pleural effusion, partially loculated at RIGHT apex laterally. Bibasilar atelectasis. No pneumothorax. IMPRESSION: Sex bibasilar atelectasis. RIGHT pleural effusion, partially loculated in the upper RIGHT chest laterally. Electronically Signed   By: Lavonia Dana M.D.   On: 02/03/2020 13:03   DG CHEST PORT 1 VIEW  Result Date: 02/03/2020 CLINICAL DATA:  Respiratory failure EXAM: PORTABLE CHEST 1 VIEW COMPARISON:  02/15/2020 FINDINGS: Endotracheal tube seen 4 cm above the carina. Nasoenteric feeding tube extends into the upper abdomen beyond the margin of the examination. Right internal jugular hemodialysis catheter tip at the superior cavoatrial junction. Right subclavian left ventricular assist device appears slightly withdrawn, though this likely still crosses the aortic valve plane. Median sternotomy wires have been removed in the interval. Loculated right upper lobe pleural fluid has developed. Single right and dual left chest tubes are in place. No pneumothorax. Mild interstitial pulmonary edema has progressed slightly within the  perihilar regions, likely cardiogenic in nature. Moderate cardiomegaly appears unchanged peer IMPRESSION: Interval removal of the a sternotomy wires compatible with given history of sternal debridement. Moderate loculated pleural effusion at the right apex, possibly the residua of sternal washout. Slight interval retraction of the left ventricular assist device still likely crossing the aortic valve plane. Progressive mild cardiogenic failure. Bilateral chest tubes in place.  No pneumothorax. Electronically Signed   By: Fidela Salisbury MD   On: 02/03/2020 05:27   DG Abd Portable 1V  Result Date: 02/03/2020 CLINICAL DATA:  Check feeding catheter placement EXAM: PORTABLE ABDOMEN - 1 VIEW COMPARISON:  01/31/2020 FINDINGS: Feeding catheter is noted extending into the distal stomach. The overall appearance is similar to that seen on the prior exam. Scattered large and small bowel gas is seen. IMPRESSION: Feeding catheter in the distal stomach. Electronically Signed   By: Inez Catalina M.D.   On: 02/03/2020 09:44   ECHOCARDIOGRAM LIMITED  Result Date: 02/03/2020    ECHOCARDIOGRAM LIMITED REPORT   [name] Date of Exam: 02/03/2020 Medical Rec #:  [id number]     Height:       75.0 in Accession #:    [id number]    Weight:       234.8 lb Date of Birth:  31-Jan-1970     BSA:          2.349 m Patient Age:    58 years      BP:  82/63 mmHg Patient Gender: M             HR:           102 bpm. Exam Location:  Inpatient Procedure: Limited Echo Indications:    Impella position  History:        Patient has prior history of Echocardiogram examinations, most                 recent 02/28/2020. CHF and Cardiomyopathy, CAD, Pacemaker,                 Arrythmias:Atrial Fibrillation, Signs/Symptoms:Shortness of                 Breath and Chest Pain; Risk Factors:Non-Smoker, Hypertension,                 Diabetes and Sleep Apnea. GERD.  Sonographer:    Vickie Epley RDCS Referring Phys: [place]  1. Limited study for Impella repositioning. Distance of the Impella tip to aortic valve was shortened, now 3.4 cm.  2. Left ventricular ejection fraction, by estimation, is <20%. The left ventricle has severely decreased function. The left ventricle demonstrates global hypokinesis. FINDINGS  Left Ventricle: Left ventricular ejection fraction, by estimation, is <20%. The left ventricle has severely decreased function. The left ventricle demonstrates global hypokinesis. Abnormal (paradoxical) septal motion, consistent with left bundle branch block. Ena Dawley MD Electronically signed by Ena Dawley MD Signature Date/Time: 02/03/2020/2:18:45 PM    Final    ECHOCARDIOGRAM LIMITED  Result Date: 02/17/2020    ECHOCARDIOGRAM LIMITED REPORT   [name] Date of Exam: 02/01/2020 Medical Rec #:  [id number]     Height:       75.0 in Accession #:    [id number]    Weight:       224.4 lb Date of Birth:  25-Jun-1970     BSA:          2.305 m Patient Age:    72 years      BP:           96/80 mmHg Patient Gender: M             HR:           85 bpm. Exam Location:  Inpatient Procedure: Limited Echo Indications:    CHF-Acute Systolic G31.51 impella placement  History:        Patient has prior history of Echocardiogram examinations, most                 recent 01/28/2020. Prior CABG and Defibrillator,                 Arrythmias:Atrial Fibrillation; Risk Factors:Non-Smoker and                 Diabetes. Cardiogenic shock.  Sonographer:    Leavy Cella Referring Phys: [place]  1. 2D echo done for Impella placement. Final placement demonstrates tip of catheter 4.42cm from aortic valve cusps.. Left ventricular ejection fraction, by estimation, is 20 to 25%. The left ventricle has severely decreased function. The left ventricle demonstrates global hypokinesis. FINDINGS  Left Ventricle: 2D echo done for Impella placement. Final placement demonstrates tip of catheter 4.42cm from  aortic valve cusps. Left ventricular ejection fraction, by estimation, is 20 to 25%. The left ventricle has severely decreased function. The left ventricle demonstrates global hypokinesis. Fransico Him MD Electronically signed by Fransico Him  MD Signature Date/Time: 02/17/2020/12:36:36 PM    Final     Labs: BMET Recent Labs  Lab 02/01/20 7711 02/01/20 0422 02/01/20 1759 01/31/2020 0407 02/23/2020 6579 02/10/2020 1725 02/20/2020 1851 02/06/2020 1955 02/22/2020 2004 02/03/2020 2123 02/03/20 0004 02/03/20 0420 02/03/20 0429 02/03/20 1653 02/04/20 0418 02/04/20 0420  NA 133*   < > 132* 132*   < > 133*   < > 133*   < > 136 137 135 134* 135 136 136  K 4.9   < > 4.7 4.5   < > 4.2   < > 4.3   < > 4.3 4.3 4.7 4.6 5.0 5.2* 5.1  CL 100  --  102 99  --  102  --  102  --   --   --   --  102 103 105  --   CO2 23  --  23 24  --  23  --  21*  --   --   --   --  _0 --   GLUCOSE 262*  --  142* 116*  --  203*  --  185*  --   --   --   --  188* 183* 175*  --   BUN 44*  --  43* 44*  --  39*  --  36*  --   --   --   --  31* 28* 25*  --   CREATININE 1.70*  --  1.54* 1.46*  --  1.65*  --  1.55*  --   --   --   --  1.37* 1.44* 1.57*  --   CALCIUM 8.0*  --  8.2* 8.4*  --  8.1*  --  8.3*  --   --   --   --  7.8* 7.8* 8.2*  --   PHOS 2.5  --  2.4* 2.0*  --  2.8  --   --   --   --   --   --  3.7 3.8 4.3  --    < > = values in this interval not displayed.   CBC Recent Labs  Lab 02/01/20 0415 02/01/20 0422 02/17/2020 0407 02/03/2020 0383 02/26/2020 2231 02/03/20 0004 02/03/20 0429 02/03/20 1716 02/04/20 0418 02/04/20 0420  WBC 12.1*  --  11.0*   < > 24.9*  --  23.7* 17.2* 19.2*  --   NEUTROABS 9.2*  --  8.1*  --   --   --  19.3*  --  14.7*  --   HGB 8.2*   < > 7.9*   < > 6.7*   < > 9.9* 8.2* 7.7* 7.5*  HCT 26.5*   < > 26.8*   < > 21.3*   < > 30.0* 23.5* 23.8* 22.0*  MCV 92.3  --  91.8   < > 92.6  --  88.8 87.4 90.8  --   PLT 373  --  352   < > 319  --  254 216 231  --    < > = values in this interval not  displayed.    Medications:    . vitamin C  500 mg Per Tube TID  . aspirin  324 mg Per Tube Daily  . atorvastatin  80 mg Per Tube Daily  . busPIRone  10 mg Per Tube TID  . chlorhexidine gluconate (MEDLINE KIT)  15 mL Mouth Rinse BID  . Chlorhexidine Gluconate Cloth  6 each Topical Daily  . [START ON 02/05/2020]  darbepoetin (ARANESP) injection - NON-DIALYSIS  100 mcg Subcutaneous Q Sat-1800  . docusate  100 mg Per Tube BID  . feeding supplement (PROSource TF)  45 mL Per Tube TID  . fiber  1 packet Per Tube BID  . FLUoxetine  40 mg Per Tube BID  . lidocaine  1 patch Transdermal Q24H  . mouth rinse  15 mL Mouth Rinse 10 times per day  . melatonin  5 mg Per Tube Daily  . midodrine  15 mg Per Tube TID WC  . multivitamin  1 tablet Per Tube QHS  . oxyCODONE  10 mg Per Tube Q8H  . pantoprazole sodium  40 mg Per Tube Daily  . polyethylene glycol  17 g Per Tube Daily  . QUEtiapine  50 mg Per Tube BID  . sodium chloride flush  10-40 mL Intracatheter Q12H  . thiamine  100 mg Per Tube Daily  . valproic acid  250 mg Per Tube BID      Reesa Chew  02/04/2020, 9:38 AM

## 2020-02-04 NOTE — Progress Notes (Addendum)
Patient ID: Corey Palmer, male   DOB: August 13, 1970, 50 y.o.   MRN: [id number]     Advanced Heart Failure Rounding Note  PCP-Cardiologist: No primary care provider on file.   Subjective:    - 12/7 Torsades -ICD shock x1.   - 12/11 Impella 5.5 placed.  - 12/12 Impella repositioned in OR - 12/15 CABG x 4 with LIMA-LAD, SVG-PDA/PLV, SVG-OM - 12/16 DCCV afib.  Back to OR to re-open chest and again to evacuate hematoma.  - 12/19 CVVH initiated  - 12/23 Chest closed - 12/24 Chest reopened - 12/26 RAP back into NSR - 12/27 back in atrial fibrillation, unable to rapid atrial pace out.   - 12/28 To OR, chest partially closed and wound vac placed.  DCCV to NSR.  - 12/31 Antibiotics stopped. Swan removed, HD catheter moved from right femoral to right IJ.  - 02/28/2020 To OR for chest closure  - 01/29/2020 Chest reopened emergently at bedside again to evacuate hematoma     Continues on high pressor support but down on NE from 80 yesterday to 64 today. Still on Epi 10, VP 0.04 and Milrinone 0.25. Impella at P-8. Flow 4.3. No alarms.   Co-ox 78%. CVP 6  A-line replaced yesterday but MAPs still not correlating w/ cuff MAPs. A-line MAP low 60s. Cuff MAP mid 70s.   UF resumed overnight, currently pulling 250 cc/hr.   WBC 27>>25>>24>>19K but AF.  Remains on vancomycin/meropenem.   Heparin through impella purge. Hgb 7.5 today. Transfusion ordered.   On Amio gtt 84. In Sinus.   Impella 5.5 P-8  Flow 4.3 Waveforms ok. No alarms LDH 332    Objective:   Weight Range: 108.7 kg Body mass index is 29.95 kg/m.   Vital Signs:   Temp:  [97.5 F (36.4 C)-98.6 F (37 C)] 97.9 F (36.6 C) (01/07 0800) Pulse Rate:  [92-101] 92 (01/07 0800) Resp:  [19-28] 28 (01/07 0800) BP: (79-103)/(66-77) 92/67 (01/07 0800) SpO2:  [98 %-100 %] 100 % (01/07 0800) Arterial Line BP: (71-290)/(49-287) 87/55 (01/07 0800) FiO2 (%):  [50 %-60 %] 50 % (01/07 0733) Weight:  [108.7 kg] 108.7 kg (01/07 0645) Last BM Date:  02/03/20  Weight change: Filed Weights   02/22/2020 0600 02/03/20 0600 02/04/20 0645  Weight: 101.8 kg 106.5 kg 108.7 kg    Intake/Output:   Intake/Output Summary (Last 24 hours) at 02/04/2020 0820 Last data filed at 02/04/2020 0800 Gross per 24 hour  Intake 6432.22 ml  Output 3710 ml  Net 2722.22 ml      Physical Exam   CVP 6 General:  Critically ill, intubated and sedated, open chest  HEENT: + ETT Neck: supple. no JVD. Carotids 2+ bilat; no bruits. No lymphadenopathy or thryomegaly appreciated. Cor: Open chest + wound vac, + CTs. PMI nondisplaced. Regular rate & rhythm. No rubs, gallops or murmurs.  Lungs: intubated and course BS bilaterally  Abdomen: soft, nontender, nondistended. No hepatosplenomegaly. No bruits or masses. Good bowel sounds. Extremities: no cyanosis, clubbing, rash, 2+ bilateral LEE up to knees  Neuro: intubated and sedated   Telemetry   NSR 90s, occasional PVCs    Labs    CBC Recent Labs    02/03/20 0429 02/03/20 1716 02/04/20 0418 02/04/20 0420  WBC 23.7* 17.2* 19.2*  --   NEUTROABS 19.3*  --  14.7*  --   HGB 9.9* 8.2* 7.7* 7.5*  HCT 30.0* 23.5* 23.8* 22.0*  MCV 88.8 87.4 90.8  --   PLT 254 216 231  --  Basic Metabolic Panel Recent Labs    02/03/20 0429 02/03/20 1653 02/04/20 0418 02/04/20 0420  NA 134* 135 136 136  K 4.6 5.0 5.2* 5.1  CL 102 103 105  --   CO2 '22 23 24  ' --   GLUCOSE 188* 183* 175*  --   BUN 31* 28* 25*  --   CREATININE 1.37* 1.44* 1.57*  --   CALCIUM 7.8* 7.8* 8.2*  --   MG 2.2  --  2.5*  --   PHOS 3.7 3.8 4.3  --    Liver Function Tests Recent Labs    02/03/20 1653 02/04/20 0418  ALBUMIN 2.0* 1.9*   No results for input(s): LIPASE, AMYLASE in the last 72 hours. Cardiac Enzymes No results for input(s): CKTOTAL, CKMB, CKMBINDEX, TROPONINI in the last 72 hours.  BNP: BNP (last 3 results) Recent Labs    01/07/2020 1619  BNP 577.5*    ProBNP (last 3 results) No results for input(s): PROBNP in the  last 8760 hours.   D-Dimer No results for input(s): DDIMER in the last 72 hours. Hemoglobin A1C No results for input(s): HGBA1C in the last 72 hours. Fasting Lipid Panel No results for input(s): CHOL, HDL, LDLCALC, TRIG, CHOLHDL, LDLDIRECT in the last 72 hours. Thyroid Function Tests No results for input(s): TSH, T4TOTAL, T3FREE, THYROIDAB in the last 72 hours.  Invalid input(s): FREET3  Other results:   Imaging    DG CHEST PORT 1 VIEW  Result Date: 02/04/2020 CLINICAL DATA:  Open heart surgery EXAM: PORTABLE CHEST 1 VIEW COMPARISON:  02/03/2020 FINDINGS: Endotracheal tube remains in place with the tip not well visualized. Feeding tube in place. Impella device in the left ventricle unchanged. AICD unchanged. Bilateral chest tubes unchanged.  No pneumothorax. Loculated right apical pleural fluid mildly improved from yesterday. This was not present on 02/16/2020. Question extrapleural hematoma from Impella. Bibasilar airspace disease right greater than left unchanged likely atelectasis. No pleural effusion. IMPRESSION: Support lines remain in satisfactory position and unchanged. No pneumothorax Loculated right apical pleural fluid mildly improved. Question hematoma from Impella. Electronically Signed   By: Franchot Gallo M.D.   On: 02/04/2020 08:00   DG Chest Port 1 View  Result Date: 02/03/2020 CLINICAL DATA:  Acute respiratory failure, history coronary artery disease, CHF, diabetes mellitus, dilated cardiomyopathy, hypertension EXAM: PORTABLE CHEST 1 VIEW COMPARISON:  Portable exam 1243 hours compared to 02/03/2020 FINDINGS: Tip of endotracheal tube projects 4.7 cm above carina. Feeding tube extends into abdomen. Mediastinal drain and BILATERAL thoracostomy tubes present. RIGHT jugular central venous catheter with tip projecting over SVC. Impella catheter present. ICD via LEFT subclavian approach projects over RIGHT ventricle. Enlargement of cardiac silhouette. RIGHT pleural effusion,  partially loculated at RIGHT apex laterally. Bibasilar atelectasis. No pneumothorax. IMPRESSION: Sex bibasilar atelectasis. RIGHT pleural effusion, partially loculated in the upper RIGHT chest laterally. Electronically Signed   By: Lavonia Dana M.D.   On: 02/03/2020 13:03   DG Abd Portable 1V  Result Date: 02/03/2020 CLINICAL DATA:  Check feeding catheter placement EXAM: PORTABLE ABDOMEN - 1 VIEW COMPARISON:  01/31/2020 FINDINGS: Feeding catheter is noted extending into the distal stomach. The overall appearance is similar to that seen on the prior exam. Scattered large and small bowel gas is seen. IMPRESSION: Feeding catheter in the distal stomach. Electronically Signed   By: Inez Catalina M.D.   On: 02/03/2020 09:44   ECHOCARDIOGRAM LIMITED  Result Date: 02/03/2020    ECHOCARDIOGRAM LIMITED REPORT   Patient Name:   BRADDEN  T Zynda Date of Exam: 02/03/2020 Medical Rec #:  [id number]     Height:       75.0 in Accession #:    [id number]    Weight:       234.8 lb Date of Birth:  Aug 26, 1970     BSA:          2.349 m Patient Age:    71 years      BP:           82/63 mmHg Patient Gender: M             HR:           102 bpm. Exam Location:  Inpatient Procedure: Limited Echo Indications:    Impella position  History:        Patient has prior history of Echocardiogram examinations, most                 recent 02/15/2020. CHF and Cardiomyopathy, CAD, Pacemaker,                 Arrythmias:Atrial Fibrillation, Signs/Symptoms:Shortness of                 Breath and Chest Pain; Risk Factors:Non-Smoker, Hypertension,                 Diabetes and Sleep Apnea. GERD.  Sonographer:    Vickie Epley RDCS Referring Phys: [place]  1. Limited study for Impella repositioning. Distance of the Impella tip to aortic valve was shortened, now 3.4 cm.  2. Left ventricular ejection fraction, by estimation, is <20%. The left ventricle has severely decreased function. The left ventricle demonstrates global hypokinesis. FINDINGS   Left Ventricle: Left ventricular ejection fraction, by estimation, is <20%. The left ventricle has severely decreased function. The left ventricle demonstrates global hypokinesis. Abnormal (paradoxical) septal motion, consistent with left bundle branch block. Ena Dawley MD Electronically signed by Ena Dawley MD Signature Date/Time: 02/03/2020/2:18:45 PM    Final      Medications:     Scheduled Medications: . artificial tears  1 application Both Eyes S9H  . vitamin C  500 mg Per Tube TID  . aspirin  324 mg Per Tube Daily  . atorvastatin  80 mg Per Tube Daily  . busPIRone  10 mg Per Tube TID  . chlorhexidine gluconate (MEDLINE KIT)  15 mL Mouth Rinse BID  . Chlorhexidine Gluconate Cloth  6 each Topical Daily  . [START ON 02/05/2020] darbepoetin (ARANESP) injection - NON-DIALYSIS  100 mcg Subcutaneous Q Sat-1800  . docusate  100 mg Per Tube BID  . feeding supplement (PROSource TF)  45 mL Per Tube TID  . fiber  1 packet Per Tube BID  . FLUoxetine  40 mg Per Tube BID  . lidocaine  1 patch Transdermal Q24H  . mouth rinse  15 mL Mouth Rinse 10 times per day  . melatonin  5 mg Per Tube Daily  . midodrine  15 mg Per Tube TID WC  . multivitamin  1 tablet Per Tube QHS  . oxyCODONE  10 mg Per Tube Q8H  . pantoprazole sodium  40 mg Per Tube Daily  . polyethylene glycol  17 g Per Tube Daily  . QUEtiapine  50 mg Per Tube BID  . sodium chloride flush  10-40 mL Intracatheter Q12H  . sodium chloride flush  3 mL Intravenous Q12H  . thiamine  100 mg Per Tube Daily  . valproic acid  250 mg Per Tube BID    Infusions: .  prismasol BGK 4/2.5 500 mL/hr at 02/03/20 0149  .  prismasol BGK 4/2.5 200 mL/hr at 02/04/20 0754  . sodium chloride Stopped (01/29/20 0955)  . sodium chloride    . amiodarone 30 mg/hr (02/04/20 0800)  . cisatracurium (NIMBEX) infusion 2 mg/mL Stopped (02/03/20 0837)  . electrolyte-A Stopped (02/03/20 8115)  . epinephrine 10 mcg/min (02/04/20 0800)  . feeding supplement  (PIVOT 1.5 CAL) 50 mL/hr at 02/04/20 0800  . fentaNYL infusion INTRAVENOUS 175 mcg/hr (02/04/20 0800)  . impella catheter heparin 50 unit/mL in dextrose 5%    . insulin 1.3 mL/hr at 02/04/20 0800  . lactated ringers 20 mL/hr at 02/04/20 0800  . meropenem (MERREM) IV Stopped (02/04/20 7262)  . midazolam Stopped (02/04/20 0404)  . milrinone 0.25 mcg/kg/min (02/04/20 0800)  . norepinephrine (LEVOPHED) Adult infusion 64 mcg/min (02/04/20 0800)  . prismasol BGK 4/2.5 1,500 mL/hr at 02/03/20 2132  . vancomycin Stopped (02/03/20 1559)  . vasopressin 0.04 Units/min (02/04/20 0700)    PRN Medications: sodium chloride, Place/Maintain arterial line **AND** sodium chloride, artificial tears, bisacodyl **OR** bisacodyl, dextrose, docusate, fentaNYL, fentaNYL (SUBLIMAZE) injection, fentaNYL (SUBLIMAZE) injection, heparin, heparin, metoprolol tartrate, midazolam, ondansetron (ZOFRAN) IV, polyethylene glycol, sodium chloride flush, sodium chloride flush     Assessment/Plan   1. Cardiogenic shock/acute on chronic systolic CHF: Initially nonischemic cardiomyopathy. Possible familial cardiomyopathy as both parents had cardiomyopathy and died at around 74.However, [employer] gene testing did not show any common mutation for cardiomyopathy.  However, this admission noted to have severe 3 vessel disease so suspect component of ischemic cardiomyopathy. [employer] Jude ICD. Echo in 8/20 with EF 15% and mildly decreased RV function. Echo this admission with EF < 20%, moderate LV dilation, RV mildly reduced, severe LAE, no significant MR.Low output HF with markedly low EF.  He has a long history of cardiomyopathy (20 yrs), tends to minimize symptoms.  Cardiorenal syndrome with creatinine up to 2.2,  stabilized on milrinone and Impella 5.5. SCr 1.44 day of CABG. CABG 12/15.  Post-op shock, back to OR 12/16 to open chest (chest wall compartment syndrome) and again to evacuate hematoma.  TEE post-op with severe RV dysfunction.   CVVHD initiated 12/19 for volume removal in setting of rising Scr/decreased UOP. Suspect ATN. S/p chest closure 12/23. Hemodynamics much worse on 12/24 with rising pressor demands. Co-ox 36%.  Bedside echo severe biventricular dysfunction with marked septal bounce. Chest re-opened 12/24.  Was atrial paced back to NSR on 12/26 with improvement in hemodynamics but back in atrial fibrillation on 12/27 and unable to pace out, DCCV to NSR on 12/28. Went back to OR 1/5 for chest closure but several hours later required emergent re-opening of chest at bedside to evacuate hematoma. Remains Tenuous. Continue on high pressor support. On NE 64, VP 0.04, Epi 10. Milrinone 0.25. Impella at P-8. Flow 4.3. No alarms. Co-ox 78%. CVP 6, remains edematous (3rd spacing). UF resumed overnight, currently pulling 250 cc/hr.  - Continue current pressor support. Wean as tolerated.  - Continue Impella at P-8 - CVVH per nephrology  2. Atrial fibrillation: H/o PAF.  He was on dofetilide in the remote past but this was stopped due to noncompliance. He had an upper GI bleed from antral ulcers in 3/12. He was seen by GI and was deemed safe to restart anticoagulation as long as he remains on a PPI.  No apparent recurrence of AF until just prior to this admission, was cardioverted back  to NSR in ER but went back into atrial fibrillation.  Post-op afib, DCCV to NSR/BiV pacing am 12/16 -> back to AF. Rapid atrial pacing back to SR on 12/26 -> back in AF on 12/27 and unable to pace out. 12/28 DCCV to NSR.  Maintaining NSR.  - Continue IV amiodarone 30 mg/hr while on pressors.  - Currently off systemic heparin. Getting heparin through impella purge  - Did not have Maze with CABG.    3. CAD:   Cath this admission with severe 3 vessel disease. CABG x 4 on 12/15.  - Continue atorvastatin, ASA.  - continue current management 4. Acute on chronic hypoxemic respiratory failure: Vent per CCM.  - CCM following - Will need tracheostomy when chest  fully closed.  5. AKI on CKD stage 3: Likely ATN/cardiorenal. Anuric. On CVVH.    - CVP 6 today but still edematous (3rd spacing) - CVVH management per nephrology   6. Diabetes: Insulin.  7. Hyponatremia: 136 today  8. Torsades/ICD shock: 12/7/212 in hospital event, in setting of severe hypokalemia and hypomagnesemia.   9. Gout: h/o gout. Complained of rt knee pain c/w previous flares - treated w/ prednisone burst (completed).  10: ID: Treating for PNA and also with chest wall open.   - Afebrile.  Check blood cultures x2. No GTD.   - 1/4 restarted vancomycin and meropenum.  11. FEN: Off tube feeds for surgery today.  Prealbumin 7 12. Anemia: Given 1u PRBCs 01/30/19 Hgb 7.5>8.1 >9.9 >7.9>.9.9>>7.5 - transfuse again today x 1 unit RBCs  13. Stage II Pressure Ulcer- sacrum -Reposition every 2 hours R/L  14. Pleural Effusion  - loculated rt apical pleural effusion noted on imaging. Mild interval improvement on today's CXR - continue UF through CVVH  - PCCM following   Nelida Gores 02/04/2020 8:20 AM   Patient seen with NP, agree with the above note.   Chest wall open, had further bleeding yesterday.  Hgb 7.7.   Impella functioning stably, on P8. Position looks a bit short on echo yesterday.  LDH stable.   Co-ox 78%, CVP 8. CVVH currently with UF net 20 cc/hr.  Weight up again.  He remains on high dose pressors.  Cuff pressure and pressure from Impella significantly higher than arterial line pressure.   In NSR on amiodarone gtt.   Off systemic heparin with bleeding and re-opening of chest, getting heparin in purge.   Afebrile, on vancomycin/meropenem.  General: Sedated on vent Neck: JVP 8 cm, no thyromegaly or thyroid nodule.  Lungs: Decreased at bases.  CV: Chest wall open.  Heart regular S1/S2, no S3/S4, no murmur.  1+ edema to thighs. Abdomen: Soft, nontender, no hepatosplenomegaly, no distention.  Skin: Intact without lesions or rashes.  Neurologic:  Sedated Extremities: No clubbing or cyanosis.  HEENT: Normal.   Consistently higher MAP off cuff and Impella versus arterial line.  I will wean pressors today based on cuff and Impella pressures. Suspect component of septic/distributive shock, he is on vancomycin/meropenem. Co-ox excellent.   Volume status trending up again with anasarca even though CVP not particularly high.  Will aim for UF via CVVH net negative 50 cc/hr.   Ongoing bleeding from chest, will get 1 unit PRBCs today.  Possibly to OR for exploration.   Impella appears to be functioning appropriately.  Position somewhat short on echo yesterday but LDH stable.  Will assess under echo today, may need to advance => echo done today, Impella at 4.5-4.6 cm.  Given  no alarms and stable LDH, I will not move it.   CRITICAL CARE Performed by: Loralie Champagne  Total critical care time: 40 minutes  Critical care time was exclusive of separately billable procedures and treating other patients.  Critical care was necessary to treat or prevent imminent or life-threatening deterioration.  Critical care was time spent personally by me on the following activities: development of treatment plan with patient and/or surrogate as well as nursing, discussions with consultants, evaluation of patient's response to treatment, examination of patient, obtaining history from patient or surrogate, ordering and performing treatments and interventions, ordering and review of laboratory studies, ordering and review of radiographic studies, pulse oximetry and re-evaluation of patient's condition.  Loralie Champagne 02/04/2020 9:12 AM

## 2020-02-05 ENCOUNTER — Inpatient Hospital Stay (HOSPITAL_COMMUNITY): Payer: BC Managed Care – PPO

## 2020-02-05 DIAGNOSIS — Z9911 Dependence on respirator [ventilator] status: Secondary | ICD-10-CM | POA: Diagnosis not present

## 2020-02-05 DIAGNOSIS — D62 Acute posthemorrhagic anemia: Secondary | ICD-10-CM

## 2020-02-05 DIAGNOSIS — R570 Cardiogenic shock: Secondary | ICD-10-CM | POA: Diagnosis not present

## 2020-02-05 DIAGNOSIS — I4891 Unspecified atrial fibrillation: Secondary | ICD-10-CM | POA: Diagnosis not present

## 2020-02-05 DIAGNOSIS — J9601 Acute respiratory failure with hypoxia: Secondary | ICD-10-CM | POA: Diagnosis not present

## 2020-02-05 LAB — RENAL FUNCTION PANEL
Albumin: 1.9 g/dL — ABNORMAL LOW (ref 3.5–5.0)
Albumin: 1.8 g/dL — ABNORMAL LOW (ref 3.5–5.0)
Anion gap: 10 (ref 5–15)
Anion gap: 7 (ref 5–15)
BUN: 29 mg/dL — ABNORMAL HIGH (ref 6–20)
BUN: 28 mg/dL — ABNORMAL HIGH (ref 6–20)
CO2: 24 mmol/L (ref 22–32)
CO2: 24 mmol/L (ref 22–32)
Calcium: 7.9 mg/dL — ABNORMAL LOW (ref 8.9–10.3)
Calcium: 8 mg/dL — ABNORMAL LOW (ref 8.9–10.3)
Chloride: 101 mmol/L (ref 98–111)
Chloride: 103 mmol/L (ref 98–111)
Creatinine, Ser: 1.55 mg/dL — ABNORMAL HIGH (ref 0.61–1.24)
Creatinine, Ser: 1.45 mg/dL — ABNORMAL HIGH (ref 0.61–1.24)
GFR, Estimated: 54 mL/min — ABNORMAL LOW (ref >60)
GFR, Estimated: 58 mL/min — ABNORMAL LOW (ref >60)
Glucose, Bld: 193 mg/dL — ABNORMAL HIGH (ref 70–99)
Glucose, Bld: 196 mg/dL — ABNORMAL HIGH (ref 70–99)
Phosphorus: 3.5 mg/dL (ref 2.5–4.6)
Phosphorus: 2.8 mg/dL (ref 2.5–4.6)
Potassium: 4.3 mmol/L (ref 3.5–5.1)
Potassium: 4.2 mmol/L (ref 3.5–5.1)
Sodium: 135 mmol/L (ref 135–145)
Sodium: 134 mmol/L — ABNORMAL LOW (ref 135–145)

## 2020-02-05 LAB — TYPE AND SCREEN
ABO/RH(D): A POS
Antibody Screen: NEGATIVE
Unit division: 0
Unit division: 0
Unit division: 0

## 2020-02-05 LAB — GLUCOSE, CAPILLARY
Glucose-Capillary: 151 mg/dL — ABNORMAL HIGH (ref 70–99)
Glucose-Capillary: 162 mg/dL — ABNORMAL HIGH (ref 70–99)
Glucose-Capillary: 162 mg/dL — ABNORMAL HIGH (ref 70–99)
Glucose-Capillary: 169 mg/dL — ABNORMAL HIGH (ref 70–99)
Glucose-Capillary: 173 mg/dL — ABNORMAL HIGH (ref 70–99)
Glucose-Capillary: 175 mg/dL — ABNORMAL HIGH (ref 70–99)
Glucose-Capillary: 175 mg/dL — ABNORMAL HIGH (ref 70–99)
Glucose-Capillary: 177 mg/dL — ABNORMAL HIGH (ref 70–99)
Glucose-Capillary: 178 mg/dL — ABNORMAL HIGH (ref 70–99)
Glucose-Capillary: 180 mg/dL — ABNORMAL HIGH (ref 70–99)
Glucose-Capillary: 182 mg/dL — ABNORMAL HIGH (ref 70–99)
Glucose-Capillary: 185 mg/dL — ABNORMAL HIGH (ref 70–99)
Glucose-Capillary: 185 mg/dL — ABNORMAL HIGH (ref 70–99)
Glucose-Capillary: 185 mg/dL — ABNORMAL HIGH (ref 70–99)
Glucose-Capillary: 187 mg/dL — ABNORMAL HIGH (ref 70–99)
Glucose-Capillary: 197 mg/dL — ABNORMAL HIGH (ref 70–99)
Glucose-Capillary: 206 mg/dL — ABNORMAL HIGH (ref 70–99)
Glucose-Capillary: 213 mg/dL — ABNORMAL HIGH (ref 70–99)
Glucose-Capillary: 222 mg/dL — ABNORMAL HIGH (ref 70–99)

## 2020-02-05 LAB — CBC WITH DIFFERENTIAL/PLATELET
Abs Immature Granulocytes: 0.38 10*3/uL — ABNORMAL HIGH (ref 0.00–0.07)
Basophils Absolute: 0.1 10*3/uL (ref 0.0–0.1)
Basophils Relative: 1 %
Eosinophils Absolute: 0.6 10*3/uL — ABNORMAL HIGH (ref 0.0–0.5)
Eosinophils Relative: 4 %
HCT: 23.6 % — ABNORMAL LOW (ref 39.0–52.0)
Hemoglobin: 7.8 g/dL — ABNORMAL LOW (ref 13.0–17.0)
Immature Granulocytes: 2 %
Lymphocytes Relative: 7 %
Lymphs Abs: 1.1 10*3/uL (ref 0.7–4.0)
MCH: 30.2 pg (ref 26.0–34.0)
MCHC: 33.1 g/dL (ref 30.0–36.0)
MCV: 91.5 fL (ref 80.0–100.0)
Monocytes Absolute: 1.7 10*3/uL — ABNORMAL HIGH (ref 0.1–1.0)
Monocytes Relative: 11 %
Neutro Abs: 12.3 10*3/uL — ABNORMAL HIGH (ref 1.7–7.7)
Neutrophils Relative %: 75 %
Platelets: 190 10*3/uL (ref 150–400)
RBC: 2.58 MIL/uL — ABNORMAL LOW (ref 4.22–5.81)
RDW: 18.4 % — ABNORMAL HIGH (ref 11.5–15.5)
WBC: 16.2 10*3/uL — ABNORMAL HIGH (ref 4.0–10.5)
nRBC: 0.3 % — ABNORMAL HIGH (ref 0.0–0.2)

## 2020-02-05 LAB — COOXEMETRY PANEL
Carboxyhemoglobin: 1.8 % — ABNORMAL HIGH (ref 0.5–1.5)
Methemoglobin: 0.9 % (ref 0.0–1.5)
O2 Saturation: 75.8 %
Total hemoglobin: 7.2 g/dL — ABNORMAL LOW (ref 12.0–16.0)

## 2020-02-05 LAB — BPAM RBC
Blood Product Expiration Date: 202201262359
Blood Product Expiration Date: 202201262359
Blood Product Expiration Date: 202202102359
ISSUE DATE / TIME: 202201060101
ISSUE DATE / TIME: 202201060101
ISSUE DATE / TIME: 202201071529
Unit Type and Rh: 6200
Unit Type and Rh: 6200
Unit Type and Rh: 6200

## 2020-02-05 LAB — HEMOGLOBIN AND HEMATOCRIT, BLOOD
HCT: 25 % — ABNORMAL LOW (ref 39.0–52.0)
Hemoglobin: 7.9 g/dL — ABNORMAL LOW (ref 13.0–17.0)

## 2020-02-05 LAB — ZINC: Zinc: 69 ug/dL (ref 44–115)

## 2020-02-05 LAB — LACTATE DEHYDROGENASE: LDH: 381 U/L — ABNORMAL HIGH (ref 98–192)

## 2020-02-05 LAB — VITAMIN C: Vitamin C: 1.3 mg/dL (ref 0.4–2.0)

## 2020-02-05 LAB — PREPARE RBC (CROSSMATCH)

## 2020-02-05 LAB — MAGNESIUM: Magnesium: 2.3 mg/dL (ref 1.7–2.4)

## 2020-02-05 LAB — COPPER, SERUM: Copper: 114 ug/dL (ref 69–132)

## 2020-02-05 LAB — HEPARIN LEVEL (UNFRACTIONATED): Heparin Unfractionated: <0.1 IU/mL — ABNORMAL LOW (ref 0.30–0.70)

## 2020-02-05 MED ORDER — SODIUM CHLORIDE 0.9% IV SOLUTION
Freq: Once | INTRAVENOUS | Status: AC
  Administered 2020-02-05: 80 mL via INTRAVENOUS

## 2020-02-05 NOTE — Progress Notes (Signed)
NAME:  Corey Palmer, MRN:  [id number], DOB:  09-01-1970, LOS: 57 ADMISSION DATE:  01/25/2020, CONSULTATION DATE: 01/19/2020 REFERRING MD: Aundra Dubin, CHIEF COMPLAINT: Respiratory failure post Impella  HPI/course in hospital  50 year old man admitted with A. fib/RVR, torsades, cardiogenic shock [EF 15] requiring Impella, CABG x5, AKI requiring initiation of CRRT  Past Medical History:    has a past medical history of Anginal pain ([place]), Anxiety, Arthritis, Automatic implantable cardioverter-defibrillator in situ (2007), Bipolar disorder ([place]), CAD (coronary artery disease), Cancer ([place]) (2013), CHF (congestive heart failure) ([place]), Chondrocalcinosis of right knee (3/54/6568), Chronic systolic heart failure ([place]), Depression, Diabetes uncomplicated adult-type II, Dilated cardiomyopathy ([place]), Dyslipidemia, Dysrhythmia, GERD (gastroesophageal reflux disease), Gout, unspecified, Hepatic steatosis (2011), History of kidney stones, echocardiogram, Hypertension, Obesity (BMI 30.0-34.9), Pacemaker, Paroxysmal atrial fibrillation ([place]), Peptic ulcer, Shortness of breath, and Sleep apnea.  Significant Hospital Events:  12/2 Afib with RVR. Cardioverted in ED. AICD did not fire. Milrinone for low co ox.  12/7 ICD fired, Torsades- likely from low K of 2.8.  12/8 Cath- multivessel disease w/ low CO. Volume overloaded.  12/11 Underwent Impella 5.5 insertion for persistent symptoms of forward failure with low cardiac indices. 12/12 Back to the OR for displacement of Impella. Extubated.  12/15 CABG x4, with LIMA-LAD, SVG-PDA/PLV, SVG-OM 12/16 Emergently opened chest at bedside for tamponade, clot compressing right atrium  12/16 To OR for Exploration of chest due to bleeding  12/19 Started CRRT 12/23 Sternal Closure in OR 12/24 Worsening shock.  Chest reopened at bedside with improvement in hemodynamics.  No mediastinal hematoma. 12/25 A flutter > rapid a paced to sinus rhythm.  Remains on high-dose pressors,  inotropes 12/28 Weaning vasopressor requirements.  For chest washout today.  Back in atrial fibrillation, refractory to increased amiodarone and attempted overdrive pacing.  DCCV to NSR. Tolerating fluid removal via CRRT. 1/3: Appears euvolemic.  Adjusting glycemic control.  Chest still open so not weaning 1/4, spiking fever, Vanco meropenem resumed after culture sent from blood and sputum. 1/5 awaiting return to the OR for wound VAC dressing assessment fever curve and white blood cell count improving after antibiotics resumed the day prior, did have chest closed, developed tamponade physiology shortly after had to go back for emergent reopening of chest, return to intensive care and refractory shock 1/6: Wound VAC dressing replaced.  Still in shock.  Starting to wean sedation 1/7: Still on high-dose pressors  Consults:  PCCM, nephrology, cardiology, cardiothoracic surgery  Procedures:  12/2 R PICC >> 12/11 right ax impella 5.5 >> 12/15 ETT >> 12/15 right femoral arterial sheath >> 12/19 right femoral HD >> 12/23 chest tube-right pleural, left pleural , mediastinal  >>  Significant Diagnostic Tests:    Micro Data:  Blood culture 12/14 > negative  Blood culture 12/16 > negative Respiratory culture 12/20 > rare candida albicans Sternal wound culture 12/23 > no growth Fungal cx sternal wound 12/23 >> neg Fungal cx w/stain sternal wound 12/23 >> negative AFB sternal wound 12/23 >> negative 1/4 blood culture x2>>> 1/4 respiratory culture>>> rare candida albicans Antimicrobials:  Vancomycin 12/16 >>12/31 Meropenem 12/16 >> 12/31 Vancomycin and meropenem resumed on 1/4 Interval history:  No acute events overnight.   Objective   Blood pressure 102/67, pulse 79, temperature 97.9 F (36.6 C), temperature source Axillary, resp. rate (!) 28, height 6\' 3"  (1.905 m), weight 110 kg, SpO2 100 %. CVP:  [7 mmHg-27 mmHg] 8 mmHg  Vent Mode: PRVC FiO2 (%):  [40 %-50 %] 40 % Set Rate:  [  28  bmp] 28 bmp Vt Set:  [510 mL] 510 mL PEEP:  [8 cmH20] 8 cmH20 Plateau Pressure:  [27 cmH20-34 cmH20] 28 cmH20   Intake/Output Summary (Last 24 hours) at 02/05/2020 1159 Last data filed at 02/05/2020 1100 Gross per 24 hour  Intake 6092.38 ml  Output 5561 ml  Net 531.38 ml   Filed Weights   02/03/20 0600 02/04/20 0645 02/05/20 0500  Weight: 106.5 kg 108.7 kg 110 kg   CVP:  [7 mmHg-27 mmHg] 8 mmHg  Examination: General middle-age man lying in bed intubated, sedated, open chest, on Impella and CRRT. HEENT [place]/AT, eyes anicteric. Pulmonary decreased breath sounds bilaterally, CTAB.  Thick tan secretions of endotracheal tube. Cardiac: Impella P8, regular rate and rhythm.  Distant heart sounds.  Wound VAC in place over open sternum. abdomen soft, nontender Extremities pitting edema in all extremities Neuro RASS -4 Derm: Pallor, no ecchymoses or rashes   Assessment & Plan:   Acute hypoxic and hypercapnic respiratory failure requiring mechanical ventilation following cardiac surgery. Persistent right sided loculated pleural effusions- apically and in the base -Continue low tidal volume ventilation, 4 to 8 cc/kg ideal body weight.  Goal Pplat less than 30 and DP less than 15. -Will need tracheostomy -When stable needs evaluation of loculated pleural effusions on the right.  Has previously been too unstable for CT Pad protocol for sedation. -VAP prevention protocol. -Wean FiO2 as able to maintain SPO2 greater than 90%.  Cardiogenic shock s/p high risk CABG. Progressive shock on 1/5 secondary to obstructive physiology from hematoma after attempted chest closure necessitating emergent reopening of chest with reevacuation Post cardiac surgery RV dysfunction CAD s/p CABG, c/b cardiac tamponade, hematoma evac.  Open chest with VAC.   -Continue Impella P8.  Heparin purge. -Continue milrinone, epinephrine, norepinephrine infusions -Continue amiodarone infusion -Continue aspirin and  statin. -Needs significant volume removal.  Sepsis/septic shock I am not convinced that sepsis is the primary driver at this point -con't vanc, meropenem  Paroxysmal Afib; Currently normal sinus -Continuing IV heparin purge with impella. Systemic drip on hold due to bleeding. -Continuing IV amiodarone -Continuing telemetry monitoring  Acute kidney injury with anuria; likely cardiorenal syndrome -Con't CVVHD per nephrology. Limited in UF for net negative volume balance due to hemodynamics.  DM2 with hyperglycemia: -insulin infusion for blood glucose goal 140-180  Acute blood loss anemia, anemia of critical illness -Continue to trend daily CBC -transfusions per TCTS  Deconditioning -will need trach -will need aggressive rehabilitation after prolonged critical illness  Daily Goals Checklist  Pain/Anxiety/Delirium protocol (if indicated): PAD protocol VAP protocol (if indicated): bundle in place. DVT prophylaxis:  On hold 2/2 bleeding Nutrition Status: high nutritional risk. EN per [place]. GI prophylaxis: pantoprazole Central lines: PICC, PAC, HD catheter. Glucose control: insulin gtt Mobility/therapy needs: bed rest  Code Status: full  Family Communication: wife updated by primary  Disposition: ICU  This patient is critically ill with multiple organ system failure which requires frequent high complexity decision making, assessment, support, evaluation, and titration of therapies. This was completed through the application of advanced monitoring technologies and extensive interpretation of multiple databases. During this encounter critical care time was devoted to patient care services described in this note for 33 minutes.  Julian Hy, DO 02/05/20 3:55 PM [facility]

## 2020-02-05 NOTE — Progress Notes (Signed)
[facility] Progress Note    Assessment/ Plan:    29M with familial cardiomyopathy EF20% s/p redo CABG 11/24/23 complicated by persistent shock, multiple attempts at sternal closure with formation of chest hematoma and tamponade, Afib with RVR, and AKI requiring CRRT.    #AKI/CKD stage III- presumably due to ischemic ATN in setting of cardiogenic shock s/p CABG.  CRRT started 01/16/20. All fluids 4K/2.5Ca.  Right femoral HD catheter placed 01/16/20 switched to [place] on 01/28/20.  - continue CRRT--> Try to maintain slightly net negative today.  # CAD s/p CABG complicated by cardiogenic shock and post-op hematoma. secondary closure on 12/23 then had to be re opened.  Went to the OR for washout on 12/28, bedside washout 02/01/19.  OR 02/03/2020 for closure, then had emergent reopening at the bedside 02/08/2020 d/t anterior chest hematoma again. Vac foam replaced.  # Cardiogenic shock- underlying familial cardiomyopathy s/p redo CABG, EF ~20%. Remains on pressors: levophed up to 70, epi back on at 10, on milrinone as well.  Vasopressin back on today.  Midodrine 15 TID. Impella P8. Continue mgmt per primary  # ABLA- s/p postoperative bleeding into chest cavity s/p re-exploration for tamponade/hematoma. Received multiple blood products yesterday  # Atrial fibrillation - s/p DCCV on 12/16.  On amiodarone.  Normal sinus rhythm currently  # VDRF- per primary svc.  #-Leukocytosis/ new fever: cultures redrawn 02/01/20, vanc and meropenem restarted 02/01/20 (had been off since 01/28/20)  # R loculated pleural effusion: for CT chest on 1/6 with contrast which is necessary for clinical decision making.  #Dispo: critically ill in the ICU   Subjective:    Seen in room.  Largely unchanged. Net even last 24 hours. Remains critically ill. Weight up.   Objective:   BP 108/71   Pulse 83   Temp 98.2 F (36.8 C)   Resp (!) 28   Ht '6\' 3"'  (1.905 m)   Wt 110 kg   SpO2 100%   BMI 30.31  kg/m   Intake/Output Summary (Last 24 hours) at 02/05/2020 3664 Last data filed at 02/05/2020 0800 Gross per 24 hour  Intake 5962.32 ml  Output 5865 ml  Net 97.32 ml   Weight change: 1.3 kg  Physical Exam: Gen: sedated CVS: Impella sounds obscure heart sounds CHEST: woundvac foam in place, 2 chest tubes in place, R [place] Impella Resp: coarse bilateral breath sounds Abd: no distention Ext: ++ dependent edema ACCESS: R IJ nontunneled HD cath  Imaging: DG Chest Port 1 View  Result Date: 02/05/2020 CLINICAL DATA:  Chest tube EXAM: PORTABLE CHEST 1 VIEW COMPARISON:  Yesterday FINDINGS: Endotracheal tube with tip near the clavicular heads. Impella from the right in unchanged position. Chest tubes in place. Dialysis catheter on the right with tip the lower SVC. Single chamber ICD lead into the right ventricle. Loculated pleural or Extrapleural opacity at the right apex which is unchanged. Hazy appearance of the bilateral chest attributed to edema and atelectasis. IMPRESSION: 1. Stable hardware positioning. 2. Cardiomegaly and pulmonary edema. 3. Asymmetric loculated appearing pleural or extrapleural opacity at the right apex which did not appear at the same time is the right-sided central lines. Electronically Signed   By: Monte Fantasia M.D.   On: 02/05/2020 07:19   DG CHEST PORT 1 VIEW  Result Date: 02/04/2020 CLINICAL DATA:  Open heart surgery EXAM: PORTABLE CHEST 1 VIEW COMPARISON:  02/03/2020 FINDINGS: Endotracheal tube remains in place with the tip not well visualized. Feeding tube in place. Impella device  in the left ventricle unchanged. AICD unchanged. Bilateral chest tubes unchanged.  No pneumothorax. Loculated right apical pleural fluid mildly improved from yesterday. This was not present on 02/21/2020. Question extrapleural hematoma from Impella. Bibasilar airspace disease right greater than left unchanged likely atelectasis. No pleural effusion. IMPRESSION: Support lines remain in  satisfactory position and unchanged. No pneumothorax Loculated right apical pleural fluid mildly improved. Question hematoma from Impella. Electronically Signed   By: Franchot Gallo M.D.   On: 02/04/2020 08:00   DG Chest Port 1 View  Result Date: 02/03/2020 CLINICAL DATA:  Acute respiratory failure, history coronary artery disease, CHF, diabetes mellitus, dilated cardiomyopathy, hypertension EXAM: PORTABLE CHEST 1 VIEW COMPARISON:  Portable exam 1243 hours compared to 02/03/2020 FINDINGS: Tip of endotracheal tube projects 4.7 cm above carina. Feeding tube extends into abdomen. Mediastinal drain and BILATERAL thoracostomy tubes present. RIGHT jugular central venous catheter with tip projecting over SVC. Impella catheter present. ICD via LEFT subclavian approach projects over RIGHT ventricle. Enlargement of cardiac silhouette. RIGHT pleural effusion, partially loculated at RIGHT apex laterally. Bibasilar atelectasis. No pneumothorax. IMPRESSION: Sex bibasilar atelectasis. RIGHT pleural effusion, partially loculated in the upper RIGHT chest laterally. Electronically Signed   By: Lavonia Dana M.D.   On: 02/03/2020 13:03   DG Abd Portable 1V  Result Date: 02/03/2020 CLINICAL DATA:  Check feeding catheter placement EXAM: PORTABLE ABDOMEN - 1 VIEW COMPARISON:  01/31/2020 FINDINGS: Feeding catheter is noted extending into the distal stomach. The overall appearance is similar to that seen on the prior exam. Scattered large and small bowel gas is seen. IMPRESSION: Feeding catheter in the distal stomach. Electronically Signed   By: Inez Catalina M.D.   On: 02/03/2020 09:44   ECHOCARDIOGRAM LIMITED  Result Date: 02/04/2020    ECHOCARDIOGRAM LIMITED REPORT   Patient Name:   BODEY FRIZELL Date of Exam: 02/04/2020 Medical Rec #:  [id number]     Height:       75.0 in Accession #:    [id number]    Weight:       239.6 lb Date of Birth:  1970/05/06     BSA:          2.370 m Patient Age:    50 years      BP:           92/62 mmHg  Patient Gender: M             HR:           93 bpm. Exam Location:  Inpatient Procedure: Limited Echo Indications:    impella placement check  History:        Patient has prior history of Echocardiogram examinations, most                 recent 02/03/2020.  Sonographer:    Johny Chess Referring Phys: [place]  1. Left ventricular ejection fraction, by estimation, is 20%. The left ventricle demonstrates global hypokinesis with septal dyskinesis. The left ventricular internal cavity size was mildly dilated. Impella catheter present, tip is at 4.6 cm in the left  ventricle. Position was not adjusted.  2. Right ventricular systolic function is moderately reduced. Normal right ventricular size.  3. Limited echo for Impella position. FINDINGS  Left Ventricle: Left ventricular ejection fraction, by estimation, is 20%. The left ventricle demonstrates global hypokinesis. The left ventricular internal cavity size was mildly dilated. There is no left ventricular hypertrophy. Right Ventricle: Right ventricular systolic function is moderately reduced.  Loralie Champagne MD Electronically signed by Loralie Champagne MD Signature Date/Time: 02/04/2020/3:57:57 PM    Final    ECHOCARDIOGRAM LIMITED  Result Date: 02/03/2020    ECHOCARDIOGRAM LIMITED REPORT   Patient Name:   MADYX DELFIN Date of Exam: 02/03/2020 Medical Rec #:  [id number]     Height:       75.0 in Accession #:    [id number]    Weight:       234.8 lb Date of Birth:  Jun 07, 1970     BSA:          2.349 m Patient Age:    89 years      BP:           82/63 mmHg Patient Gender: M             HR:           102 bpm. Exam Location:  Inpatient Procedure: Limited Echo Indications:    Impella position  History:        Patient has prior history of Echocardiogram examinations, most                 recent 02/21/2020. CHF and Cardiomyopathy, CAD, Pacemaker,                 Arrythmias:Atrial Fibrillation, Signs/Symptoms:Shortness of                 Breath and Chest  Pain; Risk Factors:Non-Smoker, Hypertension,                 Diabetes and Sleep Apnea. GERD.  Sonographer:    Vickie Epley RDCS Referring Phys: [place]  1. Limited study for Impella repositioning. Distance of the Impella tip to aortic valve was shortened, now 3.4 cm.  2. Left ventricular ejection fraction, by estimation, is <20%. The left ventricle has severely decreased function. The left ventricle demonstrates global hypokinesis. FINDINGS  Left Ventricle: Left ventricular ejection fraction, by estimation, is <20%. The left ventricle has severely decreased function. The left ventricle demonstrates global hypokinesis. Abnormal (paradoxical) septal motion, consistent with left bundle branch block. Ena Dawley MD Electronically signed by Ena Dawley MD Signature Date/Time: 02/03/2020/2:18:45 PM    Final     Labs: BMET Recent Labs  Lab 01/30/2020 0407 01/29/2020 8811 02/28/2020 1725 02/01/2020 1851 02/21/2020 1955 02/28/2020 2004 02/03/20 0429 02/03/20 1653 02/04/20 0418 02/04/20 0420 02/04/20 1101 02/04/20 1109 02/04/20 1600 02/05/20 0524  NA 132*   < > 133*   < > 133*   < > 134* 135 136 136 135 136 134* 135  K 4.5   < > 4.2   < > 4.3   < > 4.6 5.0 5.2* 5.1 4.9 5.0 4.6 4.3  CL 99  --  102  --  102  --  102 103 105  --  103  --  103 101  CO2 24  --  23  --  21*  --  '22 23 24  ' --   --   --  23 24  GLUCOSE 116*  --  203*  --  185*  --  188* 183* 175*  --  213*  --  192* 193*  BUN 44*  --  39*  --  36*  --  31* 28* 25*  --  27*  --  28* 29*  CREATININE 1.46*  --  1.65*  --  1.55*  --  1.37* 1.44* 1.57*  --  1.60*  --  1.59* 1.55*  CALCIUM 8.4*  --  8.1*  --  8.3*  --  7.8* 7.8* 8.2*  --   --   --  8.0* 7.9*  PHOS 2.0*  --  2.8  --   --   --  3.7 3.8 4.3  --   --   --  4.0 3.5   < > = values in this interval not displayed.   CBC Recent Labs  Lab 02/25/2020 0407 02/13/2020 1282 02/03/20 0429 02/03/20 1716 02/04/20 0418 02/04/20 0420 02/04/20 1101 02/04/20 1109  02/04/20 1938 02/05/20 0524  WBC 11.0*   < > 23.7* 17.2* 19.2*  --   --   --  15.5* 16.2*  NEUTROABS 8.1*  --  19.3*  --  14.7*  --   --   --   --  12.3*  HGB 7.9*   < > 9.9* 8.2* 7.7*   < > 7.5* 7.1* 7.6* 7.8*  HCT 26.8*   < > 30.0* 23.5* 23.8*   < > 22.0* 21.0* 24.0* 23.6*  MCV 91.8   < > 88.8 87.4 90.8  --   --   --  92.0 91.5  PLT 352   < > 254 216 231  --   --   --  190 190   < > = values in this interval not displayed.    Medications:    . sodium chloride   Intravenous Once  . vitamin C  500 mg Per Tube TID  . aspirin  324 mg Per Tube Daily  . atorvastatin  80 mg Per Tube Daily  . busPIRone  10 mg Per Tube TID  . chlorhexidine gluconate (MEDLINE KIT)  15 mL Mouth Rinse BID  . Chlorhexidine Gluconate Cloth  6 each Topical Daily  . darbepoetin (ARANESP) injection - NON-DIALYSIS  100 mcg Subcutaneous Q Sat-1800  . docusate  100 mg Per Tube BID  . feeding supplement (PROSource TF)  45 mL Per Tube TID  . fiber  1 packet Per Tube BID  . FLUoxetine  40 mg Per Tube BID  . lidocaine  1 patch Transdermal Q24H  . mouth rinse  15 mL Mouth Rinse 10 times per day  . melatonin  5 mg Per Tube Daily  . midodrine  15 mg Per Tube TID WC  . multivitamin  1 tablet Per Tube QHS  . oxyCODONE  10 mg Per Tube Q8H  . pantoprazole sodium  40 mg Per Tube Daily  . polyethylene glycol  17 g Per Tube Daily  . QUEtiapine  50 mg Per Tube BID  . sodium chloride flush  10-40 mL Intracatheter Q12H  . thiamine  100 mg Per Tube Daily  . valproic acid  250 mg Per Tube BID      Reesa Chew  02/05/2020, 8:11 AM

## 2020-02-05 NOTE — Procedures (Signed)
Patient was seen and evaluated on CRRT. [place] negative today. K improved today. Maintain 4k bath for now. Remains unstable. No other changes.

## 2020-02-05 NOTE — Progress Notes (Signed)
Patient ID: Corey Palmer, male   DOB: 1970-02-14, 50 y.o.   MRN: [id number]     Advanced Heart Failure Rounding Note  PCP-Cardiologist: No primary care provider on file.   Subjective:    - 12/7 Torsades -ICD shock x1.  - 12/11 Impella 5.5 placed.  - 12/12 Impella repositioned in OR - 12/15 CABG x 4 with LIMA-LAD, SVG-PDA/PLV, SVG-OM - 12/16 DCCV afib.  Back to OR to re-open chest and again to evacuate hematoma.  - 12/19 CVVH initiated  - 12/23 Chest closed - 12/24 Chest reopened - 12/26 RAP back into NSR - 12/27 back in atrial fibrillation, unable to rapid atrial pace out.   - 12/28 To OR, chest partially closed and wound vac placed.  DCCV to NSR.  - 12/31 Antibiotics stopped. Swan removed, HD catheter moved from right femoral to right IJ.  - 1/5 To OR for chest closure. Chest reopened emergently at bedside again to evacuate hematoma     Continues on high pressor support but down on NE to 48 today. Still on Epi 10, VP 0.04 and Milrinone 0.25. Impella at P-8. Flow 4.2. No alarms. Position stable on echo yesterday.   Co-ox 76%. CVP 8  I/Os even yesterday, remains on CVVH.   WBC 27>>25>>24>>19>>16K, AF.  Remains on vancomycin/meropenem.   Heparin through impella purge. Hgb 7.8 today. Transfusion ordered.   On Amio gtt 80. Remains in NSR.   TFs off due to vomiting yesterday.   Impella 5.5 P-8  Flow 4.2 Waveforms ok. No alarms LDH 332 => 381   Objective:   Weight Range: 110 kg Body mass index is 30.31 kg/m.   Vital Signs:   Temp:  [97 F (36.1 C)-99.5 F (37.5 C)] 98.2 F (36.8 C) (01/08 0300) Pulse Rate:  [52-96] 83 (01/08 0730) Resp:  [20-28] 28 (01/08 0730) BP: (78-129)/(58-92) 108/71 (01/08 0730) SpO2:  [99 %-100 %] 100 % (01/08 0730) Arterial Line BP: (56-122)/(42-66) 108/54 (01/08 0730) FiO2 (%):  [50 %] 50 % (01/08 0347) Weight:  [110 kg] 110 kg (01/08 0500) Last BM Date: 02/05/20  Weight change: Filed Weights   02/03/20 0600 02/04/20 0645 02/05/20  0500  Weight: 106.5 kg 108.7 kg 110 kg    Intake/Output:   Intake/Output Summary (Last 24 hours) at 02/05/2020 0758 Last data filed at 02/05/2020 0700 Gross per 24 hour  Intake 6213.99 ml  Output 6115 ml  Net 98.99 ml      Physical Exam   CVP 8 General: NAD Neck: JVP 8 cm, no thyromegaly or thyroid nodule.  Lungs: Decreased at bases. CV: Chest wall open.  Heart regular S1/S2, no S3/S4, no murmur. 2+ edema to knees.  Abdomen: Soft, nontender, no hepatosplenomegaly, no distention.  Skin: Intact without lesions or rashes.  Neurologic: Will awaken and follow commands. Extremities: No clubbing or cyanosis.  HEENT: Normal.    Telemetry   NSR 90s (personally reviewed)  Labs    CBC Recent Labs    02/04/20 0418 02/04/20 0420 02/04/20 1938 02/05/20 0524  WBC 19.2*  --  15.5* 16.2*  NEUTROABS 14.7*  --   --  12.3*  HGB 7.7*   < > 7.6* 7.8*  HCT 23.8*   < > 24.0* 23.6*  MCV 90.8  --  92.0 91.5  PLT 231  --  190 190   < > = values in this interval not displayed.   Basic Metabolic Panel Recent Labs    02/04/20 0418 02/04/20 0420 02/04/20 1600 02/05/20 0524  NA 136   < > 134* 135  K 5.2*   < > 4.6 4.3  CL 105   < > 103 101  CO2 24  --  23 24  GLUCOSE 175*   < > 192* 193*  BUN 25*   < > 28* 29*  CREATININE 1.57*   < > 1.59* 1.55*  CALCIUM 8.2*  --  8.0* 7.9*  MG 2.5*  --   --  2.3  PHOS 4.3  --  4.0 3.5   < > = values in this interval not displayed.   Liver Function Tests Recent Labs    02/04/20 1600 02/05/20 0524  ALBUMIN 1.9* 1.9*   No results for input(s): LIPASE, AMYLASE in the last 72 hours. Cardiac Enzymes No results for input(s): CKTOTAL, CKMB, CKMBINDEX, TROPONINI in the last 72 hours.  BNP: BNP (last 3 results) Recent Labs    01/18/2020 1619  BNP 577.5*    ProBNP (last 3 results) No results for input(s): PROBNP in the last 8760 hours.   D-Dimer No results for input(s): DDIMER in the last 72 hours. Hemoglobin A1C No results for  input(s): HGBA1C in the last 72 hours. Fasting Lipid Panel No results for input(s): CHOL, HDL, LDLCALC, TRIG, CHOLHDL, LDLDIRECT in the last 72 hours. Thyroid Function Tests No results for input(s): TSH, T4TOTAL, T3FREE, THYROIDAB in the last 72 hours.  Invalid input(s): FREET3  Other results:   Imaging    DG Chest Port 1 View  Result Date: 02/05/2020 CLINICAL DATA:  Chest tube EXAM: PORTABLE CHEST 1 VIEW COMPARISON:  Yesterday FINDINGS: Endotracheal tube with tip near the clavicular heads. Impella from the right in unchanged position. Chest tubes in place. Dialysis catheter on the right with tip the lower SVC. Single chamber ICD lead into the right ventricle. Loculated pleural or Extrapleural opacity at the right apex which is unchanged. Hazy appearance of the bilateral chest attributed to edema and atelectasis. IMPRESSION: 1. Stable hardware positioning. 2. Cardiomegaly and pulmonary edema. 3. Asymmetric loculated appearing pleural or extrapleural opacity at the right apex which did not appear at the same time is the right-sided central lines. Electronically Signed   By: Monte Fantasia M.D.   On: 02/05/2020 07:19   ECHOCARDIOGRAM LIMITED  Result Date: 02/04/2020    ECHOCARDIOGRAM LIMITED REPORT   [name] Date of Exam: 02/04/2020 Medical Rec #:  [id number]     Height:       75.0 in Accession #:    [id number]    Weight:       239.6 lb Date of Birth:  01-Feb-1970     BSA:          2.370 m Patient Age:    27 years      BP:           92/62 mmHg Patient Gender: M             HR:           93 bpm. Exam Location:  Inpatient Procedure: Limited Echo Indications:    impella placement check  History:        Patient has prior history of Echocardiogram examinations, most                 recent 02/03/2020.  Sonographer:    Johny Chess Referring Phys: [place]  1. Left ventricular ejection fraction, by estimation, is 20%. The left ventricle demonstrates global  hypokinesis with  septal dyskinesis. The left ventricular internal cavity size was mildly dilated. Impella catheter present, tip is at 4.6 cm in the left  ventricle. Position was not adjusted.  2. Right ventricular systolic function is moderately reduced. Normal right ventricular size.  3. Limited echo for Impella position. FINDINGS  Left Ventricle: Left ventricular ejection fraction, by estimation, is 20%. The left ventricle demonstrates global hypokinesis. The left ventricular internal cavity size was mildly dilated. There is no left ventricular hypertrophy. Right Ventricle: Right ventricular systolic function is moderately reduced. Loralie Champagne MD Electronically signed by Loralie Champagne MD Signature Date/Time: 02/04/2020/3:57:57 PM    Final      Medications:     Scheduled Medications: . vitamin C  500 mg Per Tube TID  . aspirin  324 mg Per Tube Daily  . atorvastatin  80 mg Per Tube Daily  . busPIRone  10 mg Per Tube TID  . chlorhexidine gluconate (MEDLINE KIT)  15 mL Mouth Rinse BID  . Chlorhexidine Gluconate Cloth  6 each Topical Daily  . darbepoetin (ARANESP) injection - NON-DIALYSIS  100 mcg Subcutaneous Q Sat-1800  . docusate  100 mg Per Tube BID  . feeding supplement (PROSource TF)  45 mL Per Tube TID  . fiber  1 packet Per Tube BID  . FLUoxetine  40 mg Per Tube BID  . lidocaine  1 patch Transdermal Q24H  . mouth rinse  15 mL Mouth Rinse 10 times per day  . melatonin  5 mg Per Tube Daily  . midodrine  15 mg Per Tube TID WC  . multivitamin  1 tablet Per Tube QHS  . oxyCODONE  10 mg Per Tube Q8H  . pantoprazole sodium  40 mg Per Tube Daily  . polyethylene glycol  17 g Per Tube Daily  . QUEtiapine  50 mg Per Tube BID  . sodium chloride flush  10-40 mL Intracatheter Q12H  . thiamine  100 mg Per Tube Daily  . valproic acid  250 mg Per Tube BID    Infusions: .  prismasol BGK 4/2.5 500 mL/hr at 02/05/20 0435  .  prismasol BGK 4/2.5 200 mL/hr at 02/04/20 1747  . sodium chloride  Stopped (01/29/20 0955)  . sodium chloride    . amiodarone 30 mg/hr (02/05/20 0700)  . electrolyte-A Stopped (02/03/20 2500)  . epinephrine 10 mcg/min (02/05/20 0700)  . feeding supplement (PIVOT 1.5 CAL) 70 mL/hr at 02/04/20 1900  . fentaNYL infusion INTRAVENOUS 175 mcg/hr (02/05/20 0700)  . impella catheter heparin 50 unit/mL in dextrose 5%    . insulin 3.4 mL/hr at 02/05/20 0700  . lactated ringers 20 mL/hr at 02/05/20 0700  . meropenem (MERREM) IV 200 mL/hr at 02/05/20 0700  . midazolam Stopped (02/04/20 0404)  . milrinone 0.25 mcg/kg/min (02/05/20 0700)  . norepinephrine (LEVOPHED) Adult infusion 48 mcg/min (02/05/20 0700)  . prismasol BGK 4/2.5 1,500 mL/hr at 02/05/20 0714  . vancomycin Stopped (02/04/20 1506)  . vasopressin 0.04 Units/min (02/05/20 0738)    PRN Medications: sodium chloride, Place/Maintain arterial line **AND** sodium chloride, artificial tears, bisacodyl **OR** bisacodyl, docusate, fentaNYL, fentaNYL (SUBLIMAZE) injection, fentaNYL (SUBLIMAZE) injection, heparin, heparin, metoprolol tartrate, midazolam, ondansetron (ZOFRAN) IV, polyethylene glycol, sodium chloride flush     Assessment/Plan   1. Cardiogenic shock/acute on chronic systolic CHF: Initially nonischemic cardiomyopathy. Possible familial cardiomyopathy as both parents had cardiomyopathy and died at around 6.However, [employer] gene testing did not show any common mutation for cardiomyopathy.  However, this admission noted to have severe 3 vessel  disease so suspect component of ischemic cardiomyopathy. [employer] ICD. Echo in 8/20 with EF 15% and mildly decreased RV function. Echo this admission with EF < 20%, moderate LV dilation, RV mildly reduced, severe LAE, no significant MR.Low output HF with markedly low EF.  He has a long history of cardiomyopathy (20 yrs), tends to minimize symptoms.  Cardiorenal syndrome with creatinine up to 2.2,  stabilized on milrinone and Impella 5.5. SCr 1.44 day of CABG.  CABG 12/15.  Post-op shock, back to OR 12/16 to open chest (chest wall compartment syndrome) and again to evacuate hematoma.  TEE post-op with severe RV dysfunction.  CVVHD initiated 12/19 for volume removal in setting of rising Scr/decreased UOP. Suspect ATN. S/p chest closure 12/23. Hemodynamics much worse on 12/24 with rising pressor demands. Co-ox 36%.  Bedside echo severe biventricular dysfunction with marked septal bounce. Chest re-opened 12/24.  Was atrial paced back to NSR on 12/26 with improvement in hemodynamics but back in atrial fibrillation on 12/27 and unable to pace out, DCCV to NSR on 12/28. Went back to OR 1/5 for chest closure but several hours later required emergent re-opening of chest at bedside to evacuate hematoma. Remains tenuous. Continue on high pressor support. On NE 48, VP 0.04, Epi 10. Milrinone 0.25. Impella at P-8 with good position on 1/7 echo. Flow 4.2. No alarms. Co-ox 76%. CVP 8, remains edematous (3rd spacing).  - Slowly wean epinephrine and NE, stable MAP today.  - Continue Impella at P-8 - Weight up, aim to pull net negative 50 cc/hr UF approximately today via CVVH.   2. Atrial fibrillation: H/o PAF.  He was on dofetilide in the remote past but this was stopped due to noncompliance. He had an upper GI bleed from antral ulcers in 3/12. He was seen by GI and was deemed safe to restart anticoagulation as long as he remains on a PPI.  No apparent recurrence of AF until just prior to this admission, was cardioverted back to NSR in ER but went back into atrial fibrillation.  Post-op afib, DCCV to NSR/BiV pacing am 12/16 -> back to AF. Rapid atrial pacing back to SR on 12/26 -> back in AF on 12/27 and unable to pace out. 12/28 DCCV to NSR.  Maintaining NSR.  - Continue IV amiodarone 30 mg/hr while on pressors.  - Currently off systemic heparin. Getting heparin through impella purge  - Did not have Maze with CABG.    3. CAD:   Cath this admission with severe 3 vessel disease.  CABG x 4 on 12/15.  - Continue atorvastatin, ASA.  - continue current management 4. Acute on chronic hypoxemic respiratory failure: Vent per CCM.  - CCM following - Will need tracheostomy when chest fully closed.  5. AKI on CKD stage 3: Likely ATN/cardiorenal. Anuric. On CVVH.    - As above, aim for net negative 50 cc/hr via CVVH today. 6. Diabetes: Insulin.  7. Hyponatremia: Resolved with CVVH.  8. Torsades/ICD shock: 12/7/212 in hospital event, in setting of severe hypokalemia and hypomagnesemia.   9. Gout: h/o gout. Complained of rt knee pain c/w previous flares - treated w/ prednisone burst (completed).  10: ID: Treating for PNA and also with chest wall open.   Afebrile.  Cultures NGTD.   - 1/4 restarted vancomycin and meropenem.  11. FEN: Off TFs with vomiting yesterday, gently restart today.  12. Anemia: Given 1u PRBCs 01/30/19 Hgb 7.5>8.1 >9.9 >7.9>.9.9>7.5>7.8 - transfuse again today x 1 unit RBCs  13.  Stage II Pressure Ulcer- sacrum -Reposition every 2 hours R/L  14. Pleural Effusion  - loculated rt apical pleural effusion noted on imaging.  - continue UF through CVVH  - PCCM following   CRITICAL CARE Performed by: Loralie Champagne  Total critical care time: 40 minutes  Critical care time was exclusive of separately billable procedures and treating other patients.  Critical care was necessary to treat or prevent imminent or life-threatening deterioration.  Critical care was time spent personally by me on the following activities: development of treatment plan with patient and/or surrogate as well as nursing, discussions with consultants, evaluation of patient's response to treatment, examination of patient, obtaining history from patient or surrogate, ordering and performing treatments and interventions, ordering and review of laboratory studies, ordering and review of radiographic studies, pulse oximetry and re-evaluation of patient's condition.  Loralie Champagne 02/05/2020 7:58 AM

## 2020-02-05 NOTE — Progress Notes (Signed)
Use Cuff SBP & MAP and Impella SBP & MAP to wean vasoactive drips (Epi & Norepi) per Dr. Aundra Dubin.   Luberta Mutter RN

## 2020-02-05 NOTE — Progress Notes (Signed)
Pharmacy Antibiotic Note  DEMITRIOUS Palmer is a 50 y.o. male admitted on 12/30/2019  S/p impella placed then> CABG and Impella remains in placed - with open chest.  Empiric ABX stopped 12/31.  Now with concerns for possible sepsis and pharmacy re-consulted for vancomycin and meropenem dosing. Afebrile currently, remains on CRRT, WBC remains elevated at 16.2, continued need for pressor support.    Plan: Vancomycin 1g IV every 24 hours  Meropenem 1 gm IV q8 hrs Monitor  cultures, clinical progression  Check vancomycin levels as indicated - continue while chest remains open  Height: 6\' 3"  (190.5 cm) Weight: 110 kg (242 lb 8.1 oz) IBW/kg (Calculated) : 84.5  Temp (24hrs), Avg:98.5 F (36.9 C), Min:97 F (36.1 C), Max:99.5 F (37.5 C)  Recent Labs  Lab 02/03/20 0429 02/03/20 1653 02/03/20 1716 02/04/20 0418 02/04/20 1101 02/04/20 1600 02/04/20 1938 02/05/20 0524  WBC 23.7*  --  17.2* 19.2*  --   --  15.5* 16.2*  CREATININE 1.37* 1.44*  --  1.57* 1.60* 1.59*  --  1.55*    Estimated Creatinine Clearance: 75.5 mL/min (A) (by C-G formula based on SCr of 1.55 mg/dL (H)).    Allergies  Allergen Reactions  . Orange Fruit Anaphylaxis, Hives and Other (See Comments)    Per Pt- Blisters around lips and Hives all over, also  . Penicillins Anaphylaxis    Did it involve swelling of the face/tongue/throat, SOB, or low BP? Yes Did it involve sudden or severe rash/hives, skin peeling, or any reaction on the inside of your mouth or nose? Yes Did you need to seek medical attention at a hospital or doctor's office? No When did it last happen?childhood If all above answers are "NO", may proceed with cephalosporin use.  [name] [Empagliflozin] Itching  . [name] [Insulin Glargine] Nausea And Vomiting    Antimicrobials this admission: Vancomycin 12/15 >> 12/31 resume 1/4  Meropenem 12/16 >> 12/31 resume 1/4  Dose adjustments this admission: 12/18: VT 26 - decrease to 750 mg IV  q12 hrs 12/19: starting CRRT - adjust Vanc to 1500 mg IV q24 hrs 12/21 VT 27 - decrease Vanc to 1250 q24hr 12/25 VT 31 - hold vancomycin for 12/26 12/27 VT 21 (after holding doses) - restart vanc 1g IV q24hr 12/27 VT 20 - continue  Microbiology results: 1/4 TA: rare candida albicans 1/4 BCx: ngtd 12/23 sternal wound: neg 12/20 TA: rare yeast 12/16 BCx: ngtd 12/14 BCx: ngtd  12/11 MRSA PCR: neg  Richardine Service, PharmD, BCPS PGY2 Cardiology Pharmacy Resident Phone: [phone] 02/05/2020  8:30 AM  Please check [email or web address] for unit-specific pharmacy phone numbers.

## 2020-02-05 NOTE — Progress Notes (Deleted)
Patient was seen and evaluated on CRRT. [place] negative today. K improved today. Maintain 4k bath for now. Remains unstable. No other changes.

## 2020-02-05 NOTE — Progress Notes (Signed)
Patient ID: STEPFON Palmer, male   DOB: 1970/09/17, 50 y.o.   MRN: [id number] T[facility] DAILY ICU PROGRESS NOTE                   [address]            [place] [address]          [phone]   3 Days Post-Op Procedure(s) (LRB): STERNAL WASHOUT (N/A) WOUND VAC CHANGE (N/A) STERNAL CLOSURE (N/A) TRANSESOPHAGEAL ECHOCARDIOGRAM (TEE) (N/A)  Total Length of Stay:  LOS: 37 days   Subjective: Sedated on ventilator, chest open sternal movement with wound VAC in place  Objective: Vital signs in last 24 hours: Temp:  [97 F (36.1 C)-99.5 F (37.5 C)] 98.3 F (36.8 C) (01/08 0900) Pulse Rate:  [52-96] 83 (01/08 0900) Cardiac Rhythm: Normal sinus rhythm (01/08 0400) Resp:  [20-28] 28 (01/08 0900) BP: (78-129)/(58-92) 110/68 (01/08 0900) SpO2:  [99 %-100 %] 100 % (01/08 0900) Arterial Line BP: (56-122)/(42-66) 103/55 (01/08 0900) FiO2 (%):  [40 %-50 %] 40 % (01/08 0822) Weight:  [110 kg] 110 kg (01/08 0500)  Filed Weights   02/03/20 0600 02/04/20 0645 02/05/20 0500  Weight: 106.5 kg 108.7 kg 110 kg    Weight change: 1.3 kg   Hemodynamic parameters for last 24 hours: CVP:  [7 mmHg-27 mmHg] 8 mmHg  Intake/Output from previous day: 01/07 0701 - 01/08 0700 In: 6214 [I.V.:3920.4; Blood:315; NG/GT:1305; IV Piggyback:455.5] Out: 6115 [Drains:175; Chest Tube:20]  Intake/Output this shift: Total I/O In: 176.6 [I.V.:152.5; Other:9.1; IV Piggyback:15] Out: 423 [Other:323; Stool:100]  Current Meds: Scheduled Meds: . sodium chloride   Intravenous Once  . vitamin C  500 mg Per Tube TID  . aspirin  324 mg Per Tube Daily  . atorvastatin  80 mg Per Tube Daily  . busPIRone  10 mg Per Tube TID  . chlorhexidine gluconate (MEDLINE KIT)  15 mL Mouth Rinse BID  . Chlorhexidine Gluconate Cloth  6 each Topical Daily  . darbepoetin (ARANESP) injection - NON-DIALYSIS  100 mcg Subcutaneous Q Sat-1800  . docusate  100 mg Per Tube BID  . feeding supplement (PROSource TF)  45 mL Per  Tube TID  . fiber  1 packet Per Tube BID  . FLUoxetine  40 mg Per Tube BID  . lidocaine  1 patch Transdermal Q24H  . mouth rinse  15 mL Mouth Rinse 10 times per day  . melatonin  5 mg Per Tube Daily  . midodrine  15 mg Per Tube TID WC  . multivitamin  1 tablet Per Tube QHS  . oxyCODONE  10 mg Per Tube Q8H  . pantoprazole sodium  40 mg Per Tube Daily  . polyethylene glycol  17 g Per Tube Daily  . QUEtiapine  50 mg Per Tube BID  . sodium chloride flush  10-40 mL Intracatheter Q12H  . thiamine  100 mg Per Tube Daily  . valproic acid  250 mg Per Tube BID   Continuous Infusions: .  prismasol BGK 4/2.5 500 mL/hr at 02/05/20 0435  .  prismasol BGK 4/2.5 200 mL/hr at 02/04/20 1747  . sodium chloride Stopped (01/29/20 0955)  . sodium chloride    . amiodarone 30 mg/hr (02/05/20 0800)  . electrolyte-A Stopped (02/03/20 7425)  . epinephrine 9 mcg/min (02/05/20 0850)  . feeding supplement (PIVOT 1.5 CAL) 20 mL/hr at 02/05/20 0800  . fentaNYL infusion INTRAVENOUS 175 mcg/hr (02/05/20 0800)  . impella catheter heparin 50 unit/mL in  dextrose 5%    . insulin 3.4 mL/hr at 02/05/20 0800  . lactated ringers 20 mL/hr at 02/05/20 0800  . meropenem (MERREM) IV Stopped (02/05/20 0704)  . midazolam Stopped (02/04/20 0404)  . milrinone 0.25 mcg/kg/min (02/05/20 0800)  . norepinephrine (LEVOPHED) Adult infusion 48 mcg/min (02/05/20 0800)  . prismasol BGK 4/2.5 1,500 mL/hr at 02/05/20 0714  . vancomycin Stopped (02/04/20 1506)  . vasopressin 0.04 Units/min (02/05/20 0738)   PRN Meds:.sodium chloride, Place/Maintain arterial line **AND** sodium chloride, artificial tears, bisacodyl **OR** bisacodyl, docusate, fentaNYL, fentaNYL (SUBLIMAZE) injection, fentaNYL (SUBLIMAZE) injection, heparin, heparin, metoprolol tartrate, midazolam, ondansetron (ZOFRAN) IV, polyethylene glycol, sodium chloride flush    Lab Results: CBC: Recent Labs    02/04/20 1938 02/05/20 0524  WBC 15.5* 16.2*  HGB 7.6* 7.8*  HCT  24.0* 23.6*  PLT 190 190   BMET:  Recent Labs    02/04/20 1600 02/05/20 0524  NA 134* 135  K 4.6 4.3  CL 103 101  CO2 23 24  GLUCOSE 192* 193*  BUN 28* 29*  CREATININE 1.59* 1.55*  CALCIUM 8.0* 7.9*    CMET: Lab Results  Component Value Date   WBC 16.2 (H) 02/05/2020   HGB 7.8 (L) 02/05/2020   HCT 23.6 (L) 02/05/2020   PLT 190 02/05/2020   GLUCOSE 193 (H) 02/05/2020   CHOL 176 10/01/2018   TRIG 126 10/01/2018   HDL 25 (L) 10/01/2018   LDLDIRECT 116.5 11/01/2011   LDLCALC 126 (H) 10/01/2018   ALT 46 (H) 02/01/2020   ALT 46 (H) 02/01/2020   AST 63 (H) 02/01/2020   AST 62 (H) 02/01/2020   NA 135 02/05/2020   K 4.3 02/05/2020   CL 101 02/05/2020   CREATININE 1.55 (H) 02/05/2020   BUN 29 (H) 02/05/2020   CO2 24 02/05/2020   TSH 3.009 10/01/2018   INR 1.4 (H) 02/03/2020   HGBA1C 10.6 (H) 01/03/2020      PT/INR:  Recent Labs    02/03/20 1653  LABPROT 16.4*  INR 1.4*   Radiology: DG Chest Port 1 View  Result Date: 02/05/2020 CLINICAL DATA:  Chest tube EXAM: PORTABLE CHEST 1 VIEW COMPARISON:  Yesterday FINDINGS: Endotracheal tube with tip near the clavicular heads. Impella from the right in unchanged position. Chest tubes in place. Dialysis catheter on the right with tip the lower SVC. Single chamber ICD lead into the right ventricle. Loculated pleural or Extrapleural opacity at the right apex which is unchanged. Hazy appearance of the bilateral chest attributed to edema and atelectasis. IMPRESSION: 1. Stable hardware positioning. 2. Cardiomegaly and pulmonary edema. 3. Asymmetric loculated appearing pleural or extrapleural opacity at the right apex which did not appear at the same time is the right-sided central lines. Electronically Signed   By: Monte Fantasia M.D.   On: 02/05/2020 07:19   ECHOCARDIOGRAM LIMITED  Result Date: 02/04/2020    ECHOCARDIOGRAM LIMITED REPORT   [name] Date of Exam: 02/04/2020 Medical Rec #:  [id number]     Height:        75.0 in Accession #:    [id number]    Weight:       239.6 lb Date of Birth:  05-Sep-1970     BSA:          2.370 m Patient Age:    50 years      BP:           92/62 mmHg Patient Gender: M  HR:           93 bpm. Exam Location:  Inpatient Procedure: Limited Echo Indications:    impella placement check  History:        Patient has prior history of Echocardiogram examinations, most                 recent 02/03/2020.  Sonographer:    Johny Chess Referring Phys: [place]  1. Left ventricular ejection fraction, by estimation, is 20%. The left ventricle demonstrates global hypokinesis with septal dyskinesis. The left ventricular internal cavity size was mildly dilated. Impella catheter present, tip is at 4.6 cm in the left  ventricle. Position was not adjusted.  2. Right ventricular systolic function is moderately reduced. Normal right ventricular size.  3. Limited echo for Impella position. FINDINGS  Left Ventricle: Left ventricular ejection fraction, by estimation, is 20%. The left ventricle demonstrates global hypokinesis. The left ventricular internal cavity size was mildly dilated. There is no left ventricular hypertrophy. Right Ventricle: Right ventricular systolic function is moderately reduced. Loralie Champagne MD Electronically signed by Loralie Champagne MD Signature Date/Time: 02/04/2020/3:57:57 PM    Final      Assessment/Plan: S/P Procedure(s) (LRB): STERNAL WASHOUT (N/A) WOUND VAC CHANGE (N/A) STERNAL CLOSURE (N/A) TRANSESOPHAGEAL ECHOCARDIOGRAM (TEE) (N/A) To get 1 unit of blood today for low hemoglobin Continue with pressors as needed-we will try to wean epinephrine today    Grace Isaac 02/05/2020 9:14 AM

## 2020-02-05 NOTE — Progress Notes (Signed)
Patient ID: Corey Palmer, male   DOB: September 21, 1970, 50 y.o.   MRN: [id number] EVENING ROUNDS NOTE :     [address]       [place] [address]             [phone]                 3 Days Post-Op Procedure(s) (LRB): STERNAL WASHOUT (N/A) WOUND VAC CHANGE (N/A) STERNAL CLOSURE (N/A) TRANSESOPHAGEAL ECHOCARDIOGRAM (TEE) (N/A)  Total Length of Stay:  LOS: 37 days  BP (!) 102/59   Pulse 82   Temp 97.9 F (36.6 C) (Axillary)   Resp (!) 28   Ht 6\' 3"  (1.905 m)   Wt 110 kg   SpO2 100%   BMI 30.31 kg/m   .Intake/Output      01/07 0701 01/08 0700 01/08 0701 01/09 0700   P.O. 0    I.V. (mL/kg) 3920.4 (35.6) 1464.6 (13.3)   Blood 315 315   Other 218.1 71.1   NG/GT 1305 490   IV Piggyback 455.5 335   Total Intake(mL/kg) 6214 (56.5) 2675.6 (24.3)   Urine (mL/kg/hr)  0 (0)   Emesis/NG output  0   Drains 175 50   Other 5920 2012   Stool  200   Blood  0   Chest Tube 20 0   Total Output 6115 2262   Net +99 +413.6        Urine Occurrence  0 x   Stool Occurrence  0 x   Emesis Occurrence  0 x     .  prismasol BGK 4/2.5 500 mL/hr at 02/05/20 1600  .  prismasol BGK 4/2.5 200 mL/hr at 02/04/20 1747  . sodium chloride Stopped (01/29/20 0955)  . sodium chloride    . amiodarone 30 mg/hr (02/05/20 1700)  . electrolyte-A Stopped (02/03/20 2130)  . epinephrine 7 mcg/min (02/05/20 1700)  . feeding supplement (PIVOT 1.5 CAL) 1,000 mL (02/05/20 1400)  . fentaNYL infusion INTRAVENOUS 175 mcg/hr (02/05/20 1700)  . impella catheter heparin 50 unit/mL in dextrose 5%    . insulin 3.8 mL/hr at 02/05/20 1700  . lactated ringers 20 mL/hr at 02/05/20 1700  . meropenem (MERREM) IV Stopped (02/05/20 1425)  . midazolam Stopped (02/04/20 0404)  . milrinone 0.25 mcg/kg/min (02/05/20 1700)  . norepinephrine (LEVOPHED) Adult infusion 42 mcg/min (02/05/20 1725)  . prismasol BGK 4/2.5 1,500 mL/hr at 02/05/20 1736  . vancomycin Stopped (02/05/20 1532)  . vasopressin 0.04 Units/min  (02/05/20 1624)     Lab Results  Component Value Date   WBC 16.2 (H) 02/05/2020   HGB 7.9 (L) 02/05/2020   HCT 25.0 (L) 02/05/2020   PLT 190 02/05/2020   GLUCOSE 196 (H) 02/05/2020   CHOL 176 10/01/2018   TRIG 126 10/01/2018   HDL 25 (L) 10/01/2018   LDLDIRECT 116.5 11/01/2011   LDLCALC 126 (H) 10/01/2018   ALT 46 (H) 02/01/2020   ALT 46 (H) 02/01/2020   AST 63 (H) 02/01/2020   AST 62 (H) 02/01/2020   NA 134 (L) 02/05/2020   K 4.2 02/05/2020   CL 103 02/05/2020   CREATININE 1.45 (H) 02/05/2020   BUN 28 (H) 02/05/2020   CO2 24 02/05/2020   TSH 3.009 10/01/2018   INR 1.4 (H) 02/03/2020   HGBA1C 10.6 (H) 01/03/2020   Levo decreased but still on epi and milrinone and vaso pressin   Grace Isaac MD  Beeper [phone] Office [phone] 02/05/2020 6:06 PM

## 2020-02-05 NOTE — Progress Notes (Signed)
   02/05/20 1229  Clinical Encounter Type  Visited With Other (Comment) (spoke with the patient's brother Coralyn Mark on the phone)  Visit Type Spiritual support  Referral From Other (Comment)  Consult/Referral To Chaplain  Chaplain responded. Patient's brother Arik Husmann is requesting to video chat with patient. Coralyn Mark stated he is aware that the patient is in a coma and the patient's wife will not give code to receive info about the patient. Coralyn Mark stated he and his brother had disagreement and he wants to apologize to the patient. Chaplain called Financial controller, Gerhard Perches who spoke with Nurse Judson Roch. Sarah requested the brother's phone number to set up a video chat. Coralyn Mark is appreciative of everyone assisting him. This note was prepared by Jeanine Luz, M.Div..  For questions please contact by phone [phone].

## 2020-02-06 ENCOUNTER — Inpatient Hospital Stay (HOSPITAL_COMMUNITY): Payer: BC Managed Care – PPO

## 2020-02-06 DIAGNOSIS — J9602 Acute respiratory failure with hypercapnia: Secondary | ICD-10-CM | POA: Diagnosis not present

## 2020-02-06 DIAGNOSIS — R570 Cardiogenic shock: Secondary | ICD-10-CM | POA: Diagnosis not present

## 2020-02-06 DIAGNOSIS — Z9911 Dependence on respirator [ventilator] status: Secondary | ICD-10-CM | POA: Diagnosis not present

## 2020-02-06 DIAGNOSIS — J9601 Acute respiratory failure with hypoxia: Secondary | ICD-10-CM | POA: Diagnosis not present

## 2020-02-06 LAB — CBC WITH DIFFERENTIAL/PLATELET
Abs Immature Granulocytes: 0.14 10*3/uL — ABNORMAL HIGH (ref 0.00–0.07)
Basophils Absolute: 0.1 10*3/uL (ref 0.0–0.1)
Basophils Relative: 1 %
Eosinophils Absolute: 0.5 10*3/uL (ref 0.0–0.5)
Eosinophils Relative: 5 %
HCT: 23.3 % — ABNORMAL LOW (ref 39.0–52.0)
Hemoglobin: 7.7 g/dL — ABNORMAL LOW (ref 13.0–17.0)
Immature Granulocytes: 1 %
Lymphocytes Relative: 6 %
Lymphs Abs: 0.6 10*3/uL — ABNORMAL LOW (ref 0.7–4.0)
MCH: 30 pg (ref 26.0–34.0)
MCHC: 33 g/dL (ref 30.0–36.0)
MCV: 90.7 fL (ref 80.0–100.0)
Monocytes Absolute: 1.1 10*3/uL — ABNORMAL HIGH (ref 0.1–1.0)
Monocytes Relative: 9 %
Neutro Abs: 9.1 10*3/uL — ABNORMAL HIGH (ref 1.7–7.7)
Neutrophils Relative %: 78 %
Platelets: 136 10*3/uL — ABNORMAL LOW (ref 150–400)
RBC: 2.57 MIL/uL — ABNORMAL LOW (ref 4.22–5.81)
RDW: 18.9 % — ABNORMAL HIGH (ref 11.5–15.5)
WBC: 11.5 10*3/uL — ABNORMAL HIGH (ref 4.0–10.5)
nRBC: 0.2 % (ref 0.0–0.2)

## 2020-02-06 LAB — GLUCOSE, CAPILLARY
Glucose-Capillary: 155 mg/dL — ABNORMAL HIGH (ref 70–99)
Glucose-Capillary: 181 mg/dL — ABNORMAL HIGH (ref 70–99)
Glucose-Capillary: 164 mg/dL — ABNORMAL HIGH (ref 70–99)
Glucose-Capillary: 157 mg/dL — ABNORMAL HIGH (ref 70–99)

## 2020-02-06 LAB — CBC
HCT: 24.2 % — ABNORMAL LOW (ref 39.0–52.0)
Hemoglobin: 7.7 g/dL — ABNORMAL LOW (ref 13.0–17.0)
MCH: 29.3 pg (ref 26.0–34.0)
MCHC: 31.8 g/dL (ref 30.0–36.0)
MCV: 92 fL (ref 80.0–100.0)
Platelets: 142 10*3/uL — ABNORMAL LOW (ref 150–400)
RBC: 2.63 MIL/uL — ABNORMAL LOW (ref 4.22–5.81)
RDW: 18.9 % — ABNORMAL HIGH (ref 11.5–15.5)
WBC: 11.8 10*3/uL — ABNORMAL HIGH (ref 4.0–10.5)
nRBC: 0.3 % — ABNORMAL HIGH (ref 0.0–0.2)

## 2020-02-06 LAB — RENAL FUNCTION PANEL
Albumin: 1.8 g/dL — ABNORMAL LOW (ref 3.5–5.0)
Albumin: 1.8 g/dL — ABNORMAL LOW (ref 3.5–5.0)
Anion gap: 7 (ref 5–15)
Anion gap: 8 (ref 5–15)
BUN: 24 mg/dL — ABNORMAL HIGH (ref 6–20)
BUN: 24 mg/dL — ABNORMAL HIGH (ref 6–20)
CO2: 23 mmol/L (ref 22–32)
CO2: 24 mmol/L (ref 22–32)
Calcium: 8 mg/dL — ABNORMAL LOW (ref 8.9–10.3)
Calcium: 7.9 mg/dL — ABNORMAL LOW (ref 8.9–10.3)
Chloride: 103 mmol/L (ref 98–111)
Chloride: 102 mmol/L (ref 98–111)
Creatinine, Ser: 1.47 mg/dL — ABNORMAL HIGH (ref 0.61–1.24)
Creatinine, Ser: 1.53 mg/dL — ABNORMAL HIGH (ref 0.61–1.24)
GFR, Estimated: 57 mL/min — ABNORMAL LOW (ref >60)
GFR, Estimated: 55 mL/min — ABNORMAL LOW (ref >60)
Glucose, Bld: 153 mg/dL — ABNORMAL HIGH (ref 70–99)
Glucose, Bld: 166 mg/dL — ABNORMAL HIGH (ref 70–99)
Phosphorus: 2 mg/dL — ABNORMAL LOW (ref 2.5–4.6)
Phosphorus: 1.7 mg/dL — ABNORMAL LOW (ref 2.5–4.6)
Potassium: 3.9 mmol/L (ref 3.5–5.1)
Potassium: 3.8 mmol/L (ref 3.5–5.1)
Sodium: 133 mmol/L — ABNORMAL LOW (ref 135–145)
Sodium: 134 mmol/L — ABNORMAL LOW (ref 135–145)

## 2020-02-06 LAB — CULTURE, BLOOD (ROUTINE X 2)
Culture: NO GROWTH
Culture: NO GROWTH

## 2020-02-06 LAB — LACTATE DEHYDROGENASE: LDH: 396 U/L — ABNORMAL HIGH (ref 98–192)

## 2020-02-06 LAB — HEPARIN LEVEL (UNFRACTIONATED): Heparin Unfractionated: <0.1 IU/mL — ABNORMAL LOW (ref 0.30–0.70)

## 2020-02-06 LAB — COOXEMETRY PANEL
Carboxyhemoglobin: 2.1 % — ABNORMAL HIGH (ref 0.5–1.5)
Methemoglobin: 0.9 % (ref 0.0–1.5)
O2 Saturation: 70 %
Total hemoglobin: 7.9 g/dL — ABNORMAL LOW (ref 12.0–16.0)

## 2020-02-06 LAB — MAGNESIUM: Magnesium: 2.2 mg/dL (ref 1.7–2.4)

## 2020-02-06 MED ORDER — MIDAZOLAM HCL 2 MG/2ML IJ SOLN
2.0000 mg | INTRAMUSCULAR | Status: DC | PRN
  Administered 2020-02-06 – 2020-02-07 (×3): 2 mg via INTRAVENOUS
  Filled 2020-02-06 (×3): qty 2

## 2020-02-06 MED ORDER — MIDAZOLAM HCL 2 MG/2ML IJ SOLN
1.0000 mg | Freq: Once | INTRAMUSCULAR | Status: AC
  Administered 2020-02-06: 2 mg via INTRAVENOUS
  Filled 2020-02-06: qty 2

## 2020-02-06 NOTE — Progress Notes (Signed)
[facility] Progress Note    Assessment/ Plan:    50M with familial cardiomyopathy EF20% s/p redo CABG 92/44/62 complicated by persistent shock, multiple attempts at sternal closure with formation of chest hematoma and tamponade, Afib with RVR, and AKI requiring CRRT.    #AKI/CKD stage III- presumably due to ischemic ATN in setting of cardiogenic shock s/p CABG.  CRRT started 01/16/20. All fluids 4K/2.5Ca.  Right femoral HD catheter placed 01/16/20 switched to [place] on 01/28/20.  - continue CRRT--> Continue goal net negative today. Tolerated well yesterday  # CAD s/p CABG complicated by cardiogenic shock and post-op hematoma. secondary closure on 12/23 then had to be re opened.  Went to the OR for washout on 12/28, bedside washout 02/01/19.  OR 02/16/2020 for closure, then had emergent reopening at the bedside 02/13/2020 d/t anterior chest hematoma again. Vac foam replaced.  # Cardiogenic shock- underlying familial cardiomyopathy s/p redo CABG, EF ~20%. Remains on pressors: levo and vaso. Also on milrinone. Impella P8. Continue mgmt per primary  # ABLA- s/p postoperative bleeding into chest cavity s/p re-exploration for tamponade/hematoma. Received multiple blood products yesterday  # Atrial fibrillation - s/p DCCV on 12/16.  On amiodarone.  Normal sinus rhythm currently  # VDRF- per primary svc.  #-Leukocytosis/ new fever: cultures redrawn 02/01/20, vanc and meropenem restarted 02/01/20 (had been off since 01/28/20)  # R loculated pleural effusion: for CT chest on 1/6 with contrast which is necessary for clinical decision making.  #Dispo: critically ill in the ICU   Subjective:    Seen in room.  Did well yesterday with -2L on CV. Epi off on norepi, vaso, and milrinone.   Objective:   BP (!) 99/59   Pulse 79   Temp 98.4 F (36.9 C)   Resp (!) 24   Ht _0  (1.905 m)   Wt 107.5 kg   SpO2 99%   BMI 29.62 kg/m   Intake/Output Summary (Last 24 hours) at 02/06/2020  0813 Last data filed at 02/06/2020 0700 Gross per 24 hour  Intake 4949.39 ml  Output 6697 ml  Net -1747.61 ml   Weight change: -2.5 kg  Physical Exam: Gen: sedated, comfortable CVS: Impella sounds obscure heart sounds CHEST: woundvac foam in place, 2 chest tubes in place, R [place] Impella Resp: coarse bilateral breath sounds Abd: no distention Ext: edema in ble ACCESS: R IJ nontunneled HD cath  Imaging: DG Chest Port 1 View  Result Date: 02/05/2020 CLINICAL DATA:  Chest tube EXAM: PORTABLE CHEST 1 VIEW COMPARISON:  Yesterday FINDINGS: Endotracheal tube with tip near the clavicular heads. Impella from the right in unchanged position. Chest tubes in place. Dialysis catheter on the right with tip the lower SVC. Single chamber ICD lead into the right ventricle. Loculated pleural or Extrapleural opacity at the right apex which is unchanged. Hazy appearance of the bilateral chest attributed to edema and atelectasis. IMPRESSION: 1. Stable hardware positioning. 2. Cardiomegaly and pulmonary edema. 3. Asymmetric loculated appearing pleural or extrapleural opacity at the right apex which did not appear at the same time is the right-sided central lines. Electronically Signed   By: Monte Fantasia M.D.   On: 02/05/2020 07:19   ECHOCARDIOGRAM LIMITED  Result Date: 02/04/2020    ECHOCARDIOGRAM LIMITED REPORT   Patient Name:   ADDEN STROUT Date of Exam: 02/04/2020 Medical Rec #:  [id number]     Height:       75.0 in Accession #:    [id number]  Weight:       239.6 lb Date of Birth:  [birth year]     BSA:          2.370 m Patient Age:    50 years      BP:           92/62 mmHg Patient Gender: M             HR:           93 bpm. Exam Location:  Inpatient Procedure: Limited Echo Indications:    impella placement check  History:        Patient has prior history of Echocardiogram examinations, most                 recent 02/03/2020.  Sonographer:    Johny Chess Referring Phys: [place]  1. Left  ventricular ejection fraction, by estimation, is 20%. The left ventricle demonstrates global hypokinesis with septal dyskinesis. The left ventricular internal cavity size was mildly dilated. Impella catheter present, tip is at 4.6 cm in the left  ventricle. Position was not adjusted.  2. Right ventricular systolic function is moderately reduced. Normal right ventricular size.  3. Limited echo for Impella position. FINDINGS  Left Ventricle: Left ventricular ejection fraction, by estimation, is 20%. The left ventricle demonstrates global hypokinesis. The left ventricular internal cavity size was mildly dilated. There is no left ventricular hypertrophy. Right Ventricle: Right ventricular systolic function is moderately reduced. Loralie Champagne MD Electronically signed by Loralie Champagne MD Signature Date/Time: 02/04/2020/3:57:57 PM    Final     Labs: BMET Recent Labs  Lab 02/03/20 0233 02/03/20 1653 02/04/20 0418 02/04/20 0420 02/04/20 1101 02/04/20 1109 02/04/20 1600 02/05/20 0524 02/05/20 1604 02/06/20 0448  NA 134* 135 136 136 135 136 134* 135 134* 133*  K 4.6 5.0 5.2* 5.1 4.9 5.0 4.6 4.3 4.2 3.9  CL 102 103 105  --  103  --  103 101 103 103  CO2 _0 --   --   --  _1 GLUCOSE 188* 183* 175*  --  213*  --  192* 193* 196* 153*  BUN 31* 28* 25*  --  27*  --  28* 29* 28* 24*  CREATININE 1.37* 1.44* 1.57*  --  1.60*  --  1.59* 1.55* 1.45* 1.47*  CALCIUM 7.8* 7.8* 8.2*  --   --   --  8.0* 7.9* 8.0* 8.0*  PHOS 3.7 3.8 4.3  --   --   --  4.0 3.5 2.8 2.0*   CBC Recent Labs  Lab 02/03/20 0429 02/03/20 1716 02/04/20 0418 02/04/20 0420 02/04/20 1938 02/05/20 0524 02/05/20 1707 02/06/20 0448  WBC 23.7*   < > 19.2*  --  15.5* 16.2*  --  11.5*  NEUTROABS 19.3*  --  14.7*  --   --  12.3*  --  9.1*  HGB 9.9*   < > 7.7*   < > 7.6* 7.8* 7.9* 7.7*  HCT 30.0*   < > 23.8*   < > 24.0* 23.6* 25.0* 23.3*  MCV 88.8   < > 90.8  --  92.0 91.5  --  90.7  PLT 254   < > 231  --  190 190  --   136*   < > = values in this interval not displayed.    Medications:    . vitamin C  500 mg Per Tube TID  . aspirin  324  mg Per Tube Daily  . atorvastatin  80 mg Per Tube Daily  . busPIRone  10 mg Per Tube TID  . chlorhexidine gluconate (MEDLINE KIT)  15 mL Mouth Rinse BID  . Chlorhexidine Gluconate Cloth  6 each Topical Daily  . darbepoetin (ARANESP) injection - NON-DIALYSIS  100 mcg Subcutaneous Q Sat-1800  . docusate  100 mg Per Tube BID  . feeding supplement (PROSource TF)  45 mL Per Tube TID  . fiber  1 packet Per Tube BID  . FLUoxetine  40 mg Per Tube BID  . lidocaine  1 patch Transdermal Q24H  . mouth rinse  15 mL Mouth Rinse 10 times per day  . melatonin  5 mg Per Tube Daily  . midodrine  15 mg Per Tube TID WC  . multivitamin  1 tablet Per Tube QHS  . oxyCODONE  10 mg Per Tube Q8H  . pantoprazole sodium  40 mg Per Tube Daily  . polyethylene glycol  17 g Per Tube Daily  . QUEtiapine  50 mg Per Tube BID  . sodium chloride flush  10-40 mL Intracatheter Q12H  . thiamine  100 mg Per Tube Daily  . valproic acid  250 mg Per Tube BID      Reesa Chew  02/06/2020, 8:13 AM

## 2020-02-06 NOTE — Progress Notes (Signed)
[facility] Progress Note Patient Name: Corey Palmer DOB: 08-29-1970 MRN: [id number]   Date of Service  02/06/2020  HPI/Events of Note  Pt extremely anxious, Maxed on fentanyl @ 300. Trying to hold Versed drip and only has bolus' from the drip. Need PRN IVP Versed separate from drip.  On pressors.  Notes, labs reviewed.stable anemia. K was ok.    eICU Interventions  Versed prn ordered.  Call back if not better for ABG/CxR.      Intervention Category Intermediate Interventions: Other:  Elmer Sow 02/06/2020, 8:41 PM

## 2020-02-06 NOTE — Progress Notes (Signed)
Patient ID: Corey Palmer, male   DOB: [birth year], 50 y.o.   MRN: [id number] EVENING ROUNDS NOTE :     [address]       [place] [address]             [phone]                 4 Days Post-Op Procedure(s) (LRB): STERNAL WASHOUT (N/A) WOUND VAC CHANGE (N/A) STERNAL CLOSURE (N/A) TRANSESOPHAGEAL ECHOCARDIOGRAM (TEE) (N/A)   POST OP DAY 25 Procedure:  Redomedian sternotomy  coronary Artery Bypass Grafting x4 Left Internal Mammary Artery to Distal Left Anterior Descending Coronary Artery,Saphenous Vein Graft to Posterior Descending Coronary Arteryand right posterior lateral artery as a sequenced graft;saphenous Vein Graft to Obtuse Marginal Branch of Left Circumflex Coronary Artery;Endoscopic Vein Harvest fromright thigh and Lower Leg Completion graft surveillance with indocyanine green fluorescence angiography   Total Length of Stay:  LOS: 38 days  BP 112/73   Pulse 74   Temp 98.2 F (36.8 C) (Esophageal)   Resp (!) 28   Ht 6\' 3"  (1.905 m)   Wt 107.5 kg   SpO2 99%   BMI 29.62 kg/m   .Intake/Output      01/08 0701 01/09 0700 01/09 0701 01/10 0700   P.O.     I.V. (mL/kg) 3016.2 (28.1) 824.6 (7.7)   Blood 315    Other 209.7 78.4   NG/GT 1050 250   IV Piggyback 535.1 315.1   Total Intake(mL/kg) 5126 (47.7) 1468.1 (13.7)   Urine (mL/kg/hr) 0 (0) 0 (0)   Emesis/NG output 0 0   Drains 100 50   Other 6280 1688   Stool 700 100   Blood 0 0   Chest Tube 40 20   Total Output 7120 1858   Net -1994 -389.9        Urine Occurrence 0 x 0 x   Stool Occurrence 0 x 0 x   Emesis Occurrence 0 x 1 x     .  prismasol BGK 4/2.5 500 mL/hr at 02/06/20 1140  .  prismasol BGK 4/2.5 200 mL/hr at 02/05/20 2016  . sodium chloride Stopped (01/29/20 0955)  . sodium chloride    . amiodarone 30 mg/hr (02/06/20 1600)  . electrolyte-A Stopped (02/03/20 9326)  . epinephrine Stopped (02/06/20 0334)  . feeding supplement (PIVOT 1.5 CAL) Stopped  (02/06/20 1200)  . fentaNYL infusion INTRAVENOUS 250 mcg/hr (02/06/20 1600)  . impella catheter heparin 50 unit/mL in dextrose 5%    . insulin 1.7 mL/hr at 02/06/20 1600  . lactated ringers 20 mL/hr at 02/06/20 1600  . meropenem (MERREM) IV Stopped (02/06/20 1431)  . midazolam Stopped (02/04/20 0404)  . milrinone 0.125 mcg/kg/min (02/06/20 1600)  . norepinephrine (LEVOPHED) Adult infusion 26 mcg/min (02/06/20 1600)  . prismasol BGK 4/2.5 1,500 mL/hr at 02/06/20 1347  . vancomycin Stopped (02/06/20 1538)  . vasopressin 0.04 Units/min (02/06/20 0931)     Lab Results  Component Value Date   WBC 11.8 (H) 02/06/2020   HGB 7.7 (L) 02/06/2020   HCT 24.2 (L) 02/06/2020   PLT 142 (L) 02/06/2020   GLUCOSE 166 (H) 02/06/2020   CHOL 176 10/01/2018   TRIG 126 10/01/2018   HDL 25 (L) 10/01/2018   LDLDIRECT 116.5 11/01/2011   LDLCALC 126 (H) 10/01/2018   ALT 46 (H) 02/01/2020   ALT 46 (H) 02/01/2020   AST 63 (H) 02/01/2020   AST 62 (H) 02/01/2020   NA 134 (L) 02/06/2020  K 3.8 02/06/2020   CL 102 02/06/2020   CREATININE 1.53 (H) 02/06/2020   BUN 24 (H) 02/06/2020   CO2 24 02/06/2020   TSH 3.009 10/01/2018   INR 1.4 (H) 02/03/2020   HGBA1C 10.6 (H) 01/03/2020   Leva phed now 24 Epi is off  impella p8 no alerms Grace Isaac MD  Beeper [phone] Office [phone] 02/06/2020 6:12 PM

## 2020-02-06 NOTE — Progress Notes (Signed)
Patient ID: Corey Palmer, male   DOB: Oct 16, 1970, 50 y.o.   MRN: [id number] [facility] DAILY ICU PROGRESS NOTE                   [address]            [place] [address]          [phone]   4 Days Post-Op Procedure(s) (LRB): STERNAL WASHOUT (N/A) WOUND VAC CHANGE (N/A) STERNAL CLOSURE (N/A) TRANSESOPHAGEAL ECHOCARDIOGRAM (TEE) (N/A)  POST OP DAY 25 Procedure:       Redo median sternotomy   coronary Artery Bypass Grafting x 4             Left Internal Mammary Artery to Distal Left Anterior Descending Coronary Artery, Saphenous Vein Graft to Posterior Descending Coronary Artery and right posterior lateral artery as a sequenced graft ; saphenous Vein Graft to Obtuse Marginal Branch of Left Circumflex Coronary Artery; Endoscopic Vein Harvest from right thigh and Lower Leg Completion graft surveillance with indocyanine green fluorescence angiography  Total Length of Stay:  LOS: 38 days   Subjective: Opens eyes to stimulus, remains sedated on the ventilator  Objective: Vital signs in last 24 hours: Temp:  [97.3 F (36.3 C)-98.8 F (37.1 C)] 98.4 F (36.9 C) (01/09 0215) Pulse Rate:  [73-88] 79 (01/09 0745) Cardiac Rhythm: Normal sinus rhythm (01/09 0400) Resp:  [22-29] 24 (01/09 0745) BP: (85-124)/(51-89) 99/59 (01/09 0745) SpO2:  [94 %-100 %] 99 % (01/09 0745) Arterial Line BP: (86-112)/(46-61) 103/53 (01/09 0745) FiO2 (%):  [40 %] 40 % (01/09 0338) Weight:  [107.5 kg] 107.5 kg (01/09 0500)  Filed Weights   02/04/20 0645 02/05/20 0500 02/06/20 0500  Weight: 108.7 kg 110 kg 107.5 kg    Weight change: -2.5 kg   Hemodynamic parameters for last 24 hours: CVP:  [4 mmHg-34 mmHg] 12 mmHg  Intake/Output from previous day: 01/08 0701 - 01/09 0700 In: 5126 [I.V.:3016.2; Blood:315; NG/GT:1050; IV Piggyback:535.1] Out: 7120 [Drains:100; Stool:700; Chest Tube:40]  Intake/Output this shift: Total I/O In: 124.9 [I.V.:96; Other:8.9; NG/GT:20] Out: 184  [Other:184]  Current Meds: Scheduled Meds: . vitamin C  500 mg Per Tube TID  . aspirin  324 mg Per Tube Daily  . atorvastatin  80 mg Per Tube Daily  . busPIRone  10 mg Per Tube TID  . chlorhexidine gluconate (MEDLINE KIT)  15 mL Mouth Rinse BID  . Chlorhexidine Gluconate Cloth  6 each Topical Daily  . darbepoetin (ARANESP) injection - NON-DIALYSIS  100 mcg Subcutaneous Q Sat-1800  . docusate  100 mg Per Tube BID  . feeding supplement (PROSource TF)  45 mL Per Tube TID  . fiber  1 packet Per Tube BID  . FLUoxetine  40 mg Per Tube BID  . lidocaine  1 patch Transdermal Q24H  . mouth rinse  15 mL Mouth Rinse 10 times per day  . melatonin  5 mg Per Tube Daily  . midodrine  15 mg Per Tube TID WC  . multivitamin  1 tablet Per Tube QHS  . oxyCODONE  10 mg Per Tube Q8H  . pantoprazole sodium  40 mg Per Tube Daily  . polyethylene glycol  17 g Per Tube Daily  . QUEtiapine  50 mg Per Tube BID  . sodium chloride flush  10-40 mL Intracatheter Q12H  . thiamine  100 mg Per Tube Daily  . valproic acid  250 mg Per Tube BID   Continuous Infusions: .  prismasol  BGK 4/2.5 500 mL/hr at 02/06/20 0117  .  prismasol BGK 4/2.5 200 mL/hr at 02/05/20 2016  . sodium chloride Stopped (01/29/20 0955)  . sodium chloride    . amiodarone 30 mg/hr (02/06/20 0800)  . electrolyte-A Stopped (02/03/20 3845)  . epinephrine Stopped (02/06/20 0334)  . feeding supplement (PIVOT 1.5 CAL) 20 mL/hr at 02/06/20 0000  . fentaNYL infusion INTRAVENOUS 225 mcg/hr (02/06/20 0700)  . impella catheter heparin 50 unit/mL in dextrose 5%    . insulin 2.8 mL/hr at 02/06/20 0800  . lactated ringers 20 mL/hr at 02/06/20 0800  . meropenem (MERREM) IV Stopped (02/06/20 3646)  . midazolam Stopped (02/04/20 0404)  . milrinone 0.25 mcg/kg/min (02/06/20 0800)  . norepinephrine (LEVOPHED) Adult infusion 26 mcg/min (02/06/20 0800)  . prismasol BGK 4/2.5 1,500 mL/hr at 02/06/20 0650  . vancomycin Stopped (02/05/20 1532)  . vasopressin  0.04 Units/min (02/06/20 0016)   PRN Meds:.sodium chloride, Place/Maintain arterial line **AND** sodium chloride, artificial tears, bisacodyl **OR** bisacodyl, docusate, fentaNYL, fentaNYL (SUBLIMAZE) injection, fentaNYL (SUBLIMAZE) injection, heparin, heparin, metoprolol tartrate, midazolam, ondansetron (ZOFRAN) IV, polyethylene glycol, sodium chloride flush  General appearance: opens eyses to stimilus Neurologic: intact Wound: wound vac in place  Lab Results: CBC: Recent Labs    02/05/20 0524 02/05/20 1707 02/06/20 0448  WBC 16.2*  --  11.5*  HGB 7.8* 7.9* 7.7*  HCT 23.6* 25.0* 23.3*  PLT 190  --  136*   BMET:  Recent Labs    02/05/20 1604 02/06/20 0448  NA 134* 133*  K 4.2 3.9  CL 103 103  CO2 24 23  GLUCOSE 196* 153*  BUN 28* 24*  CREATININE 1.45* 1.47*  CALCIUM 8.0* 8.0*    CMET: Lab Results  Component Value Date   WBC 11.5 (H) 02/06/2020   HGB 7.7 (L) 02/06/2020   HCT 23.3 (L) 02/06/2020   PLT 136 (L) 02/06/2020   GLUCOSE 153 (H) 02/06/2020   CHOL 176 10/01/2018   TRIG 126 10/01/2018   HDL 25 (L) 10/01/2018   LDLDIRECT 116.5 11/01/2011   LDLCALC 126 (H) 10/01/2018   ALT 46 (H) 02/01/2020   ALT 46 (H) 02/01/2020   AST 63 (H) 02/01/2020   AST 62 (H) 02/01/2020   NA 133 (L) 02/06/2020   K 3.9 02/06/2020   CL 103 02/06/2020   CREATININE 1.47 (H) 02/06/2020   BUN 24 (H) 02/06/2020   CO2 23 02/06/2020   TSH 3.009 10/01/2018   INR 1.4 (H) 02/03/2020   HGBA1C 10.6 (H) 01/03/2020      PT/INR:  Recent Labs    02/03/20 1653  LABPROT 16.4*  INR 1.4*   Radiology: No results found.   Assessment/Plan: S/P Procedure(s) (LRB): STERNAL WASHOUT (N/A) WOUND VAC CHANGE (N/A) STERNAL CLOSURE (N/A) TRANSESOPHAGEAL ECHOCARDIOGRAM (TEE) (N/A) Epi now off, bp softer today - still pressor dependent     Grace Isaac 02/06/2020 8:26 AM

## 2020-02-06 NOTE — Progress Notes (Signed)
Patient ID: Corey Palmer, male   DOB: 11/07/1970, 50 y.o.   MRN: [id number]     Advanced Heart Failure Rounding Note  PCP-Cardiologist: No primary care provider on file.   Subjective:    - 12/7 Torsades -ICD shock x1.  - 12/11 Impella 5.5 placed.  - 12/12 Impella repositioned in OR - 12/15 CABG x 4 with LIMA-LAD, SVG-PDA/PLV, SVG-OM - 12/16 DCCV afib.  Back to OR to re-open chest and again to evacuate hematoma.  - 12/19 CVVH initiated  - 12/23 Chest closed - 12/24 Chest reopened - 12/26 RAP back into NSR - 12/27 back in atrial fibrillation, unable to rapid atrial pace out.   - 12/28 To OR, chest partially closed and wound vac placed.  DCCV to NSR.  - 12/31 Antibiotics stopped. Swan removed, HD catheter moved from right femoral to right IJ.  - 1/5 To OR for chest closure. Chest reopened emergently at bedside again to evacuate hematoma     Weaning pressors, NE down to 26, off epinephrine, remains on VP 0.04 and Milrinone 0.25. Impella at P-8. Flow 4.1. No alarms.   Co-ox 70%. CVP 8-9.  CVVH running net negative 50 cc/hr UF, weight down 6 lbs.    WBC 27>>25>>24>>19>>16>>11.5K, AF.  Remains on vancomycin/meropenem.   Heparin through impella purge. Hgb 7.7 today, had 1 unit PRBCs yesterday.  Of note, plts lower 136 today.   On Amio gtt 59. Remains in NSR.   TFs restarted.   Impella 5.5 P-8  Flow 4.1 Waveforms ok. No alarms LDH 332 => 381=> 396   Objective:   Weight Range: 107.5 kg Body mass index is 29.62 kg/m.   Vital Signs:   Temp:  [97.3 F (36.3 C)-98.8 F (37.1 C)] 98.4 F (36.9 C) (01/09 0215) Pulse Rate:  [73-88] 79 (01/09 0745) Resp:  [22-29] 24 (01/09 0745) BP: (85-124)/(51-89) 99/59 (01/09 0745) SpO2:  [94 %-100 %] 99 % (01/09 0745) Arterial Line BP: (74-112)/(46-61) 103/53 (01/09 0745) FiO2 (%):  [40 %] 40 % (01/09 0338) Weight:  [107.5 kg] 107.5 kg (01/09 0500) Last BM Date: 02/06/20  Weight change: Filed Weights   02/04/20 0645 02/05/20 0500  02/06/20 0500  Weight: 108.7 kg 110 kg 107.5 kg    Intake/Output:   Intake/Output Summary (Last 24 hours) at 02/06/2020 0804 Last data filed at 02/06/2020 0700 Gross per 24 hour  Intake 4949.39 ml  Output 6697 ml  Net -1747.61 ml      Physical Exam   CVP 8-9 General: NAD Neck: JVP 8 cm, no thyromegaly or thyroid nodule.  Lungs: Decreased at bases.  CV: Nondisplaced PMI.  Heart regular S1/S2, no S3/S4, no murmur.  1+ edema to knees. Abdomen: Soft, nontender, no hepatosplenomegaly, no distention.  Skin: Intact without lesions or rashes.  Neurologic: Will awaken, follow commands.  Extremities: No clubbing or cyanosis.  HEENT: Normal.    Telemetry   NSR 80s (personally reviewed)  Labs    CBC Recent Labs    02/05/20 0524 02/05/20 1707 02/06/20 0448  WBC 16.2*  --  11.5*  NEUTROABS 12.3*  --  9.1*  HGB 7.8* 7.9* 7.7*  HCT 23.6* 25.0* 23.3*  MCV 91.5  --  90.7  PLT 190  --  371*   Basic Metabolic Panel Recent Labs    02/05/20 0524 02/05/20 1604 02/06/20 0448  NA 135 134* 133*  K 4.3 4.2 3.9  CL 101 103 103  CO2 '24 24 23  ' GLUCOSE 193* 196* 153*  BUN  29* 28* 24*  CREATININE 1.55* 1.45* 1.47*  CALCIUM 7.9* 8.0* 8.0*  MG 2.3  --  2.2  PHOS 3.5 2.8 2.0*   Liver Function Tests Recent Labs    02/05/20 1604 02/06/20 0448  ALBUMIN 1.8* 1.8*   No results for input(s): LIPASE, AMYLASE in the last 72 hours. Cardiac Enzymes No results for input(s): CKTOTAL, CKMB, CKMBINDEX, TROPONINI in the last 72 hours.  BNP: BNP (last 3 results) Recent Labs    01/19/2020 1619  BNP 577.5*    ProBNP (last 3 results) No results for input(s): PROBNP in the last 8760 hours.   D-Dimer No results for input(s): DDIMER in the last 72 hours. Hemoglobin A1C No results for input(s): HGBA1C in the last 72 hours. Fasting Lipid Panel No results for input(s): CHOL, HDL, LDLCALC, TRIG, CHOLHDL, LDLDIRECT in the last 72 hours. Thyroid Function Tests No results for input(s): TSH,  T4TOTAL, T3FREE, THYROIDAB in the last 72 hours.  Invalid input(s): FREET3  Other results:   Imaging    No results found.   Medications:     Scheduled Medications: . vitamin C  500 mg Per Tube TID  . aspirin  324 mg Per Tube Daily  . atorvastatin  80 mg Per Tube Daily  . busPIRone  10 mg Per Tube TID  . chlorhexidine gluconate (MEDLINE KIT)  15 mL Mouth Rinse BID  . Chlorhexidine Gluconate Cloth  6 each Topical Daily  . darbepoetin (ARANESP) injection - NON-DIALYSIS  100 mcg Subcutaneous Q Sat-1800  . docusate  100 mg Per Tube BID  . feeding supplement (PROSource TF)  45 mL Per Tube TID  . fiber  1 packet Per Tube BID  . FLUoxetine  40 mg Per Tube BID  . lidocaine  1 patch Transdermal Q24H  . mouth rinse  15 mL Mouth Rinse 10 times per day  . melatonin  5 mg Per Tube Daily  . midodrine  15 mg Per Tube TID WC  . multivitamin  1 tablet Per Tube QHS  . oxyCODONE  10 mg Per Tube Q8H  . pantoprazole sodium  40 mg Per Tube Daily  . polyethylene glycol  17 g Per Tube Daily  . QUEtiapine  50 mg Per Tube BID  . sodium chloride flush  10-40 mL Intracatheter Q12H  . thiamine  100 mg Per Tube Daily  . valproic acid  250 mg Per Tube BID    Infusions: .  prismasol BGK 4/2.5 500 mL/hr at 02/06/20 0117  .  prismasol BGK 4/2.5 200 mL/hr at 02/05/20 2016  . sodium chloride Stopped (01/29/20 0955)  . sodium chloride    . amiodarone 30 mg/hr (02/06/20 0700)  . electrolyte-A Stopped (02/03/20 4010)  . epinephrine Stopped (02/06/20 0334)  . feeding supplement (PIVOT 1.5 CAL) 20 mL/hr at 02/06/20 0000  . fentaNYL infusion INTRAVENOUS 225 mcg/hr (02/06/20 0700)  . impella catheter heparin 50 unit/mL in dextrose 5%    . insulin 2.4 mL/hr at 02/06/20 0700  . lactated ringers 20 mL/hr at 02/06/20 0700  . meropenem (MERREM) IV Stopped (02/06/20 2725)  . midazolam Stopped (02/04/20 0404)  . milrinone 0.25 mcg/kg/min (02/06/20 0700)  . norepinephrine (LEVOPHED) Adult infusion 26 mcg/min  (02/06/20 0700)  . prismasol BGK 4/2.5 1,500 mL/hr at 02/06/20 0650  . vancomycin Stopped (02/05/20 1532)  . vasopressin 0.04 Units/min (02/06/20 0016)    PRN Medications: sodium chloride, Place/Maintain arterial line **AND** sodium chloride, artificial tears, bisacodyl **OR** bisacodyl, docusate, fentaNYL, fentaNYL (SUBLIMAZE) injection,  fentaNYL (SUBLIMAZE) injection, heparin, heparin, metoprolol tartrate, midazolam, ondansetron (ZOFRAN) IV, polyethylene glycol, sodium chloride flush     Assessment/Plan   1. Cardiogenic shock/acute on chronic systolic CHF: Initially nonischemic cardiomyopathy. Possible familial cardiomyopathy as both parents had cardiomyopathy and died at around 83.However, [employer] gene testing did not show any common mutation for cardiomyopathy.  However, this admission noted to have severe 3 vessel disease so suspect component of ischemic cardiomyopathy. [employer] ICD. Echo in 8/20 with EF 15% and mildly decreased RV function. Echo this admission with EF < 20%, moderate LV dilation, RV mildly reduced, severe LAE, no significant MR.Low output HF with markedly low EF.  He has a long history of cardiomyopathy (20 yrs), tends to minimize symptoms.  Cardiorenal syndrome with creatinine up to 2.2,  stabilized on milrinone and Impella 5.5. SCr 1.44 day of CABG. CABG 12/15.  Post-op shock, back to OR 12/16 to open chest (chest wall compartment syndrome) and again to evacuate hematoma.  TEE post-op with severe RV dysfunction.  CVVHD initiated 12/19 for volume removal in setting of rising Scr/decreased UOP. Suspect ATN. S/p chest closure 12/23. Hemodynamics much worse on 12/24 with rising pressor demands. Co-ox 36%.  Bedside echo severe biventricular dysfunction with marked septal bounce. Chest re-opened 12/24.  Was atrial paced back to NSR on 12/26 with improvement in hemodynamics but back in atrial fibrillation on 12/27 and unable to pace out, DCCV to NSR on 12/28. Went back to OR 1/5  for chest closure but several hours later required emergent re-opening of chest at bedside to evacuate hematoma. Remains tenuous. Weaning pressor support. On NE 26, VP 0.04, off epinephrine. Milrinone 0.25. Impella at P-8 with good position on 1/7 echo. Flow 4.1. No alarms. Co-ox 70%. CVP 8-9, remains edematous (3rd spacing).  - Continue slow wean of NE.  - Decrease milrinone to 0.125.  - Continue Impella at P-8 - Weight down, aim to pull net negative 50 cc/hr UF approximately today via CVVH.   2. Atrial fibrillation: H/o PAF.  He was on dofetilide in the remote past but this was stopped due to noncompliance. He had an upper GI bleed from antral ulcers in 3/12. He was seen by GI and was deemed safe to restart anticoagulation as long as he remains on a PPI.  No apparent recurrence of AF until just prior to this admission, was cardioverted back to NSR in ER but went back into atrial fibrillation.  Post-op afib, DCCV to NSR/BiV pacing am 12/16 -> back to AF. Rapid atrial pacing back to SR on 12/26 -> back in AF on 12/27 and unable to pace out. 12/28 DCCV to NSR.  Maintaining NSR.  - Continue IV amiodarone 30 mg/hr while on pressors.  - Currently off systemic heparin with chest bleeding. Getting heparin through impella purge  - Did not have Maze with CABG.    3. CAD:   Cath this admission with severe 3 vessel disease. CABG x 4 on 12/15.  - Continue atorvastatin, ASA.  - continue current management 4. Acute on chronic hypoxemic respiratory failure: Vent per CCM.  - CCM following - Will need tracheostomy when chest fully closed.  5. AKI on CKD stage 3: Likely ATN/cardiorenal. Anuric. On CVVH.    - As above, aim for net negative 50 cc/hr via CVVH today. 6. Diabetes: Insulin.  7. Hyponatremia: Resolved with CVVH.  8. Torsades/ICD shock: 12/7/212 in hospital event, in setting of severe hypokalemia and hypomagnesemia.   9. Gout: h/o gout. Complained of  rt knee pain c/w previous flares - treated w/  prednisone burst (completed).  10: ID: Treating for PNA and also with chest wall open.   Afebrile.  Cultures NGTD.   - 1/4 restarted vancomycin and meropenem.  11. FEN: Back on TFs.  12. Anemia: Given 1u PRBCs 01/30/19 Hgb 7.5>8.1 >9.9 >7.9>.9.9>7.5>7.8>7.7.  Transfuse hgb < 7.5.  - CBC in pm.  13. Stage II Pressure Ulcer- sacrum -Reposition every 2 hours R/L  14. Pleural Effusion  - loculated rt apical pleural effusion noted on imaging.  - continue UF through CVVH  - PCCM following   CRITICAL CARE Performed by: Loralie Champagne  Total critical care time: 40 minutes  Critical care time was exclusive of separately billable procedures and treating other patients.  Critical care was necessary to treat or prevent imminent or life-threatening deterioration.  Critical care was time spent personally by me on the following activities: development of treatment plan with patient and/or surrogate as well as nursing, discussions with consultants, evaluation of patient's response to treatment, examination of patient, obtaining history from patient or surrogate, ordering and performing treatments and interventions, ordering and review of laboratory studies, ordering and review of radiographic studies, pulse oximetry and re-evaluation of patient's condition.  Loralie Champagne 02/06/2020 8:04 AM

## 2020-02-06 NOTE — Progress Notes (Signed)
NAME:  Corey Palmer, MRN:  [id number], DOB:  29-Feb-1970, LOS: 22 ADMISSION DATE:  12/30/2019, CONSULTATION DATE: 01/05/2020 REFERRING MD: Aundra Dubin, CHIEF COMPLAINT: Respiratory failure post Impella  HPI/course in hospital  50 year old man admitted with A. fib/RVR, torsades, cardiogenic shock [EF 15] requiring Impella, CABG x5, AKI requiring initiation of CRRT  Past Medical History:    has a past medical history of Anginal pain ([place]), Anxiety, Arthritis, Automatic implantable cardioverter-defibrillator in situ (2007), Bipolar disorder ([place]), CAD (coronary artery disease), Cancer ([place]) (2013), CHF (congestive heart failure) ([place]), Chondrocalcinosis of right knee (9/83/3825), Chronic systolic heart failure ([place]), Depression, Diabetes uncomplicated adult-type II, Dilated cardiomyopathy ([place]), Dyslipidemia, Dysrhythmia, GERD (gastroesophageal reflux disease), Gout, unspecified, Hepatic steatosis (2011), History of kidney stones, echocardiogram, Hypertension, Obesity (BMI 30.0-34.9), Pacemaker, Paroxysmal atrial fibrillation ([place]), Peptic ulcer, Shortness of breath, and Sleep apnea.  Significant Hospital Events:  12/2 Afib with RVR. Cardioverted in ED. AICD did not fire. Milrinone for low co ox.  12/7 ICD fired, Torsades- likely from low K of 2.8.  12/8 Cath- multivessel disease w/ low CO. Volume overloaded.  12/11 Underwent Impella 5.5 insertion for persistent symptoms of forward failure with low cardiac indices. 12/12 Back to the OR for displacement of Impella. Extubated.  12/15 CABG x4, with LIMA-LAD, SVG-PDA/PLV, SVG-OM 12/16 Emergently opened chest at bedside for tamponade, clot compressing right atrium  12/16 To OR for Exploration of chest due to bleeding  12/19 Started CRRT 12/23 Sternal Closure in OR 12/24 Worsening shock.  Chest reopened at bedside with improvement in hemodynamics.  No mediastinal hematoma. 12/25 A flutter > rapid a paced to sinus rhythm.  Remains on high-dose pressors,  inotropes 12/28 Weaning vasopressor requirements.  For chest washout today.  Back in atrial fibrillation, refractory to increased amiodarone and attempted overdrive pacing.  DCCV to NSR. Tolerating fluid removal via CRRT. 1/3: Appears euvolemic.  Adjusting glycemic control.  Chest still open so not weaning 1/4, spiking fever, Vanco meropenem resumed after culture sent from blood and sputum. 1/5 awaiting return to the OR for wound VAC dressing assessment fever curve and white blood cell count improving after antibiotics resumed the day prior, did have chest closed, developed tamponade physiology shortly after had to go back for emergent reopening of chest, return to intensive care and refractory shock 1/6: Wound VAC dressing replaced.  Still in shock.  Starting to wean sedation 1/7: Still on high-dose pressors  Consults:  PCCM, nephrology, cardiology, cardiothoracic surgery  Procedures:  12/2 R PICC >> 12/11 right ax impella 5.5 >> 12/15 ETT >> 12/15 right femoral arterial sheath >> 12/19 right femoral HD >> 12/23 chest tube-right pleural, left pleural , mediastinal  >>  Significant Diagnostic Tests:    Micro Data:  Blood culture 12/14 > negative  Blood culture 12/16 > negative Respiratory culture 12/20 > rare candida albicans Sternal wound culture 12/23 > no growth Fungal cx sternal wound 12/23 >> neg Fungal cx w/stain sternal wound 12/23 >> negative AFB sternal wound 12/23 >> negative 1/4 blood culture x2>>> 1/4 respiratory culture>>> rare candida albicans  Antimicrobials:  Vancomycin 12/16 >>12/31 Meropenem 12/16 >> 12/31 Vancomycin and meropenem resumed on 1/4 Interval history:  Decreasing pressor requirements, required increased sedation this morning for coughing episode causing increased WOB. Tolerating -50cc/h UF on CRRT.  Objective   Blood pressure 112/73, pulse 74, temperature 98.2 F (36.8 C), temperature source Esophageal, resp. rate (!) 28, height 6\' 3"  (1.905 m),  weight 107.5 kg, SpO2 99 %. CVP:  [4 mmHg-34 mmHg]  10 mmHg  Vent Mode: PRVC FiO2 (%):  [40 %] 40 % Set Rate:  [28 bmp] 28 bmp Vt Set:  [510 mL] 510 mL PEEP:  [5 cmH20] 5 cmH20 Plateau Pressure:  [23 cmH20-25 cmH20] 25 cmH20   Intake/Output Summary (Last 24 hours) at 02/06/2020 1718 Last data filed at 02/06/2020 1600 Gross per 24 hour  Intake 3901.4 ml  Output 6074 ml  Net -2172.6 ml   Filed Weights   02/04/20 0645 02/05/20 0500 02/06/20 0500  Weight: 108.7 kg 110 kg 107.5 kg   CVP:  [4 mmHg-34 mmHg] 10 mmHg  Examination: General: middle aged man laying in bed intubated, sedated HEENT [place]/AT, eyes anicteric, oral mucosa moist. ETT, NGT Pulmonary: copious tan ETT secretions, synchronous with MV. Rhonchi. Cardiac: Impella P8 R axillary. Wound vac w/ open chest. abdomen soft, NT, ND Extremities pitting edema in all extremities Neuro RASS -2, tracks with his eyes Derm: pallor, no rashes   CXR> persistent R sided loculated effusions   Assessment & Plan:   Acute hypoxic and hypercapnic respiratory failure requiring mechanical ventilation following cardiac surgery. Persistent right sided loculated pleural effusions- apically and in the base HAP- organism unknown -Con't LTVV, 4-8cc/kg IBW with goal Pplat<30 and DP<15 -Will need tracheostomy -CT to evaluate apical fluid collection has been deferred due to instability. Eventually needs evaluation. -PAD protocol for sedation -VAP prevention protocol. -Wean FiO2 as able to maintain SPO2 greater than 90%.  Cardiogenic shock s/p high risk CABG. Progressive shock on 1/5 secondary to obstructive physiology from hematoma after attempted chest closure necessitating emergent reopening of chest with reevacuation Post cardiac surgery RV dysfunction CAD s/p CABG, c/b cardiac tamponade, hematoma evac.  Open chest with VAC.   -Continue Impella P8 w/ heparin purge. LDH climbing raising concern for increasing hemolysis. Con't to monitor. -Continue  milrinone, norepinephrine infusions. Off epi. -Continue amiodarone infusion -Continue aspirin and statin. -Needs significant volume removal; making progress tolerating UF.  Sepsis/septic shock, likely HAP -con't vanc, meropenem; deescalate when able -trach aspirate culture pending  Paroxysmal Afib; Currently normal sinus -Continuing IV heparin purge with impella. Systemic drip on hold due to mediastinal bleeding. -Continuing IV amiodarone -Continuing telemetry monitoring  Acute kidney injury with anuria; likely cardiorenal syndrome -Con't CVVHD per nephrology. Net negative 50cc/h today.  DM2 with hyperglycemia -insulin gtt  Acute blood loss anemia, anemia of critical illness Acute thrombocytopenia, likely due to acute illness, possibly due to consumption from impella -Continue to trend daily CBC -transfusions per TCTS -platelet transfusion if <100K with major bleeding  Deconditioning -will need trach once chest closed -will need aggressive rehabilitation after prolonged critical illness  Daily Goals Checklist  Pain/Anxiety/Delirium protocol (if indicated): PAD protocol VAP protocol (if indicated): bundle in place. DVT prophylaxis:  On hold 2/2 bleeding Nutrition Status: high nutritional risk. EN per [facility]. GI prophylaxis: pantoprazole Glucose control: insulin gtt Mobility/therapy needs: bed rest  Code Status: full  Family Communication: wife Disposition: ICU  This patient is critically ill with multiple organ system failure which requires frequent high complexity decision making, assessment, support, evaluation, and titration of therapies. This was completed through the application of advanced monitoring technologies and extensive interpretation of multiple databases. During this encounter critical care time was devoted to patient care services described in this note for 35 minutes.  Julian Hy, DO 02/06/20 5:18 PM [facility]

## 2020-02-07 ENCOUNTER — Inpatient Hospital Stay (HOSPITAL_COMMUNITY): Payer: BC Managed Care – PPO

## 2020-02-07 ENCOUNTER — Inpatient Hospital Stay (HOSPITAL_COMMUNITY): Payer: BC Managed Care – PPO | Admitting: Certified Registered Nurse Anesthetist

## 2020-02-07 ENCOUNTER — Encounter (HOSPITAL_COMMUNITY): Admission: EM | Disposition: E | Payer: Self-pay | Source: Home / Self Care | Attending: Cardiothoracic Surgery

## 2020-02-07 DIAGNOSIS — N179 Acute kidney failure, unspecified: Secondary | ICD-10-CM

## 2020-02-07 DIAGNOSIS — Z95811 Presence of heart assist device: Secondary | ICD-10-CM | POA: Diagnosis not present

## 2020-02-07 DIAGNOSIS — J9601 Acute respiratory failure with hypoxia: Secondary | ICD-10-CM | POA: Diagnosis not present

## 2020-02-07 DIAGNOSIS — I9789 Other postprocedural complications and disorders of the circulatory system, not elsewhere classified: Secondary | ICD-10-CM

## 2020-02-07 DIAGNOSIS — I4891 Unspecified atrial fibrillation: Secondary | ICD-10-CM | POA: Diagnosis not present

## 2020-02-07 DIAGNOSIS — R570 Cardiogenic shock: Secondary | ICD-10-CM | POA: Diagnosis not present

## 2020-02-07 HISTORY — PX: JEJUNOSTOMY: SHX313

## 2020-02-07 HISTORY — PX: TRACHEOSTOMY TUBE PLACEMENT: SHX814

## 2020-02-07 HISTORY — PX: STERNAL WOUND DEBRIDEMENT: SHX1058

## 2020-02-07 HISTORY — PX: TEE WITHOUT CARDIOVERSION: SHX5443

## 2020-02-07 HISTORY — PX: GREATER OMENTAL FLAP CLOSURE: SHX6319

## 2020-02-07 HISTORY — PX: GASTROSTOMY: SHX5249

## 2020-02-07 LAB — POCT I-STAT 7, (LYTES, BLD GAS, ICA,H+H)
Acid-Base Excess: 0 mmol/L (ref 0.0–2.0)
Acid-base deficit: 1 mmol/L (ref 0.0–2.0)
Bicarbonate: 26.6 mmol/L (ref 20.0–28.0)
Bicarbonate: 23.7 mmol/L (ref 20.0–28.0)
Calcium, Ion: 1.19 mmol/L (ref 1.15–1.40)
Calcium, Ion: 1.14 mmol/L — ABNORMAL LOW (ref 1.15–1.40)
HCT: 25 % — ABNORMAL LOW (ref 39.0–52.0)
HCT: 21 % — ABNORMAL LOW (ref 39.0–52.0)
Hemoglobin: 8.5 g/dL — ABNORMAL LOW (ref 13.0–17.0)
Hemoglobin: 7.1 g/dL — ABNORMAL LOW (ref 13.0–17.0)
O2 Saturation: 95 %
O2 Saturation: 94 %
Patient temperature: 36.5
Patient temperature: 98.6
Potassium: 4.3 mmol/L (ref 3.5–5.1)
Potassium: 4.6 mmol/L (ref 3.5–5.1)
Sodium: 135 mmol/L (ref 135–145)
Sodium: 134 mmol/L — ABNORMAL LOW (ref 135–145)
TCO2: 28 mmol/L (ref 22–32)
TCO2: 25 mmol/L (ref 22–32)
pCO2 arterial: 50.9 mmHg — ABNORMAL HIGH (ref 32.0–48.0)
pCO2 arterial: 40.7 mmHg (ref 32.0–48.0)
pH, Arterial: 7.323 — ABNORMAL LOW (ref 7.350–7.450)
pH, Arterial: 7.374 (ref 7.350–7.450)
pO2, Arterial: 78 mmHg — ABNORMAL LOW (ref 83.0–108.0)
pO2, Arterial: 74 mmHg — ABNORMAL LOW (ref 83.0–108.0)

## 2020-02-07 LAB — RENAL FUNCTION PANEL
Albumin: 1.8 g/dL — ABNORMAL LOW (ref 3.5–5.0)
Albumin: 1.5 g/dL — ABNORMAL LOW (ref 3.5–5.0)
Anion gap: 8 (ref 5–15)
Anion gap: 8 (ref 5–15)
BUN: 23 mg/dL — ABNORMAL HIGH (ref 6–20)
BUN: 23 mg/dL — ABNORMAL HIGH (ref 6–20)
CO2: 24 mmol/L (ref 22–32)
CO2: 22 mmol/L (ref 22–32)
Calcium: 8.1 mg/dL — ABNORMAL LOW (ref 8.9–10.3)
Calcium: 7.4 mg/dL — ABNORMAL LOW (ref 8.9–10.3)
Chloride: 102 mmol/L (ref 98–111)
Chloride: 101 mmol/L (ref 98–111)
Creatinine, Ser: 1.43 mg/dL — ABNORMAL HIGH (ref 0.61–1.24)
Creatinine, Ser: 1.62 mg/dL — ABNORMAL HIGH (ref 0.61–1.24)
GFR, Estimated: 59 mL/min — ABNORMAL LOW (ref >60)
GFR, Estimated: 51 mL/min — ABNORMAL LOW (ref >60)
Glucose, Bld: 152 mg/dL — ABNORMAL HIGH (ref 70–99)
Glucose, Bld: 213 mg/dL — ABNORMAL HIGH (ref 70–99)
Phosphorus: 1.8 mg/dL — ABNORMAL LOW (ref 2.5–4.6)
Phosphorus: 4.5 mg/dL (ref 2.5–4.6)
Potassium: 3.9 mmol/L (ref 3.5–5.1)
Potassium: 4.5 mmol/L (ref 3.5–5.1)
Sodium: 134 mmol/L — ABNORMAL LOW (ref 135–145)
Sodium: 131 mmol/L — ABNORMAL LOW (ref 135–145)

## 2020-02-07 LAB — ECHO INTRAOPERATIVE TEE
Height: 75 in
Weight: 3753.11 oz

## 2020-02-07 LAB — GLUCOSE, CAPILLARY
Glucose-Capillary: 138 mg/dL — ABNORMAL HIGH (ref 70–99)
Glucose-Capillary: 141 mg/dL — ABNORMAL HIGH (ref 70–99)
Glucose-Capillary: 143 mg/dL — ABNORMAL HIGH (ref 70–99)
Glucose-Capillary: 145 mg/dL — ABNORMAL HIGH (ref 70–99)
Glucose-Capillary: 148 mg/dL — ABNORMAL HIGH (ref 70–99)
Glucose-Capillary: 149 mg/dL — ABNORMAL HIGH (ref 70–99)
Glucose-Capillary: 151 mg/dL — ABNORMAL HIGH (ref 70–99)
Glucose-Capillary: 153 mg/dL — ABNORMAL HIGH (ref 70–99)
Glucose-Capillary: 157 mg/dL — ABNORMAL HIGH (ref 70–99)
Glucose-Capillary: 159 mg/dL — ABNORMAL HIGH (ref 70–99)
Glucose-Capillary: 160 mg/dL — ABNORMAL HIGH (ref 70–99)
Glucose-Capillary: 166 mg/dL — ABNORMAL HIGH (ref 70–99)
Glucose-Capillary: 167 mg/dL — ABNORMAL HIGH (ref 70–99)
Glucose-Capillary: 169 mg/dL — ABNORMAL HIGH (ref 70–99)
Glucose-Capillary: 131 mg/dL — ABNORMAL HIGH (ref 70–99)
Glucose-Capillary: 138 mg/dL — ABNORMAL HIGH (ref 70–99)
Glucose-Capillary: 138 mg/dL — ABNORMAL HIGH (ref 70–99)
Glucose-Capillary: 144 mg/dL — ABNORMAL HIGH (ref 70–99)
Glucose-Capillary: 147 mg/dL — ABNORMAL HIGH (ref 70–99)
Glucose-Capillary: 147 mg/dL — ABNORMAL HIGH (ref 70–99)
Glucose-Capillary: 148 mg/dL — ABNORMAL HIGH (ref 70–99)

## 2020-02-07 LAB — CBC
HCT: 22.8 % — ABNORMAL LOW (ref 39.0–52.0)
Hemoglobin: 7.1 g/dL — ABNORMAL LOW (ref 13.0–17.0)
MCH: 28.4 pg (ref 26.0–34.0)
MCHC: 31.1 g/dL (ref 30.0–36.0)
MCV: 91.2 fL (ref 80.0–100.0)
Platelets: 274 10*3/uL (ref 150–400)
RBC: 2.5 MIL/uL — ABNORMAL LOW (ref 4.22–5.81)
RDW: 18.8 % — ABNORMAL HIGH (ref 11.5–15.5)
WBC: 21.5 10*3/uL — ABNORMAL HIGH (ref 4.0–10.5)
nRBC: 0.1 % (ref 0.0–0.2)

## 2020-02-07 LAB — CBC WITH DIFFERENTIAL/PLATELET
Abs Immature Granulocytes: 0.11 10*3/uL — ABNORMAL HIGH (ref 0.00–0.07)
Basophils Absolute: 0.1 10*3/uL (ref 0.0–0.1)
Basophils Relative: 1 %
Eosinophils Absolute: 0.5 10*3/uL (ref 0.0–0.5)
Eosinophils Relative: 5 %
HCT: 24 % — ABNORMAL LOW (ref 39.0–52.0)
Hemoglobin: 7.9 g/dL — ABNORMAL LOW (ref 13.0–17.0)
Immature Granulocytes: 1 %
Lymphocytes Relative: 8 %
Lymphs Abs: 0.8 10*3/uL (ref 0.7–4.0)
MCH: 30 pg (ref 26.0–34.0)
MCHC: 32.9 g/dL (ref 30.0–36.0)
MCV: 91.3 fL (ref 80.0–100.0)
Monocytes Absolute: 1.1 10*3/uL — ABNORMAL HIGH (ref 0.1–1.0)
Monocytes Relative: 11 %
Neutro Abs: 7.6 10*3/uL (ref 1.7–7.7)
Neutrophils Relative %: 74 %
Platelets: 151 10*3/uL (ref 150–400)
RBC: 2.63 MIL/uL — ABNORMAL LOW (ref 4.22–5.81)
RDW: 19.1 % — ABNORMAL HIGH (ref 11.5–15.5)
WBC: 10.1 10*3/uL (ref 4.0–10.5)
nRBC: 0.3 % — ABNORMAL HIGH (ref 0.0–0.2)

## 2020-02-07 LAB — PREPARE RBC (CROSSMATCH)

## 2020-02-07 LAB — POCT I-STAT, CHEM 8
BUN: 20 mg/dL (ref 6–20)
BUN: 22 mg/dL — ABNORMAL HIGH (ref 6–20)
Calcium, Ion: 1.2 mmol/L (ref 1.15–1.40)
Calcium, Ion: 1.2 mmol/L (ref 1.15–1.40)
Chloride: 99 mmol/L (ref 98–111)
Chloride: 100 mmol/L (ref 98–111)
Creatinine, Ser: 1.5 mg/dL — ABNORMAL HIGH (ref 0.61–1.24)
Creatinine, Ser: 1.5 mg/dL — ABNORMAL HIGH (ref 0.61–1.24)
Glucose, Bld: 160 mg/dL — ABNORMAL HIGH (ref 70–99)
Glucose, Bld: 208 mg/dL — ABNORMAL HIGH (ref 70–99)
HCT: 23 % — ABNORMAL LOW (ref 39.0–52.0)
HCT: 26 % — ABNORMAL LOW (ref 39.0–52.0)
Hemoglobin: 7.8 g/dL — ABNORMAL LOW (ref 13.0–17.0)
Hemoglobin: 8.8 g/dL — ABNORMAL LOW (ref 13.0–17.0)
Potassium: 4.3 mmol/L (ref 3.5–5.1)
Potassium: 4.1 mmol/L (ref 3.5–5.1)
Sodium: 134 mmol/L — ABNORMAL LOW (ref 135–145)
Sodium: 133 mmol/L — ABNORMAL LOW (ref 135–145)
TCO2: 27 mmol/L (ref 22–32)
TCO2: 24 mmol/L (ref 22–32)

## 2020-02-07 LAB — FIBRINOGEN: Fibrinogen: 485 mg/dL — ABNORMAL HIGH (ref 210–475)

## 2020-02-07 LAB — COOXEMETRY PANEL
Carboxyhemoglobin: 1.9 % — ABNORMAL HIGH (ref 0.5–1.5)
Methemoglobin: 0.6 % (ref 0.0–1.5)
O2 Saturation: 73.1 %
Total hemoglobin: 8.1 g/dL — ABNORMAL LOW (ref 12.0–16.0)

## 2020-02-07 LAB — MAGNESIUM: Magnesium: 2.2 mg/dL (ref 1.7–2.4)

## 2020-02-07 LAB — HEPARIN LEVEL (UNFRACTIONATED): Heparin Unfractionated: <0.1 IU/mL — ABNORMAL LOW (ref 0.30–0.70)

## 2020-02-07 LAB — LACTATE DEHYDROGENASE
LDH: 408 U/L — ABNORMAL HIGH (ref 98–192)
LDH: 374 U/L — ABNORMAL HIGH (ref 98–192)

## 2020-02-07 LAB — PROTIME-INR
INR: 1.3 — ABNORMAL HIGH (ref 0.8–1.2)
Prothrombin Time: 15.9 seconds — ABNORMAL HIGH (ref 11.4–15.2)

## 2020-02-07 SURGERY — DEBRIDEMENT, WOUND, STERNUM
Anesthesia: General

## 2020-02-07 MED ORDER — LACTATED RINGERS IV SOLN: INTRAVENOUS | Status: DC | PRN

## 2020-02-07 MED ORDER — STERILE WATER FOR IRRIGATION IR SOLN
Status: DC | PRN
  Administered 2020-02-07: 1000 mL

## 2020-02-07 MED ORDER — SODIUM CHLORIDE 0.9 % IR SOLN
Status: DC | PRN
  Administered 2020-02-07: 3000 mL

## 2020-02-07 MED ORDER — 0.9 % SODIUM CHLORIDE (POUR BTL) OPTIME
TOPICAL | Status: DC | PRN
  Administered 2020-02-07: 2000 mL

## 2020-02-07 MED ORDER — MIDAZOLAM HCL 2 MG/2ML IJ SOLN
INTRAMUSCULAR | Status: AC
  Filled 2020-02-07: qty 2

## 2020-02-07 MED ORDER — FENTANYL CITRATE (PF) 250 MCG/5ML IJ SOLN
INTRAMUSCULAR | Status: AC
  Filled 2020-02-07: qty 5

## 2020-02-07 MED ORDER — SODIUM CHLORIDE 0.9% IV SOLUTION: Freq: Once | INTRAVENOUS | Status: AC

## 2020-02-07 MED ORDER — HEMOSTATIC AGENTS (NO CHARGE) OPTIME
TOPICAL | Status: DC | PRN
  Administered 2020-02-07 (×2): 1 via TOPICAL

## 2020-02-07 MED ORDER — ROCURONIUM BROMIDE 10 MG/ML (PF) SYRINGE
PREFILLED_SYRINGE | INTRAVENOUS | Status: AC
  Filled 2020-02-07: qty 20

## 2020-02-07 MED ORDER — PROPOFOL 10 MG/ML IV BOLUS
INTRAVENOUS | Status: AC
  Filled 2020-02-07: qty 20

## 2020-02-07 MED ORDER — ROCURONIUM BROMIDE 100 MG/10ML IV SOLN
INTRAVENOUS | Status: DC | PRN
  Administered 2020-02-07 (×3): 50 mg via INTRAVENOUS

## 2020-02-07 MED ORDER — POTASSIUM PHOSPHATES 15 MMOLE/5ML IV SOLN
30.0000 mmol | Freq: Once | INTRAVENOUS | Status: AC
  Administered 2020-02-07: 30 mmol via INTRAVENOUS
  Filled 2020-02-07: qty 10

## 2020-02-07 SURGICAL SUPPLY — 117 items
AGENT HMST 10 BLLW SHRT CANN (HEMOSTASIS) ×1
APL SKNCLS STERI-STRIP NONHPOA (GAUZE/BANDAGES/DRESSINGS) ×3
APPLIER CLIP 9.375 SM OPEN (CLIP)
APR CLP SM 9.3 20 MLT OPN (CLIP)
BAG DECANTER FOR FLEXI CONT (MISCELLANEOUS) IMPLANT
BANDAGE ESMARK 6X9 LF (GAUZE/BANDAGES/DRESSINGS) IMPLANT
BENZOIN TINCTURE PRP APPL 2/3 (GAUZE/BANDAGES/DRESSINGS) ×6 IMPLANT
BLADE CLIPPER SURG (BLADE) IMPLANT
BLADE SURG 10 STRL SS (BLADE) ×2 IMPLANT
BLADE SURG 11 STRL SS (BLADE) IMPLANT
BNDG CMPR 9X6 STRL LF SNTH (GAUZE/BANDAGES/DRESSINGS)
BNDG ESMARK 6X9 LF (GAUZE/BANDAGES/DRESSINGS)
BNDG GAUZE ELAST 4 BULKY (GAUZE/BANDAGES/DRESSINGS) IMPLANT
CANISTER SUCT 3000ML PPV (MISCELLANEOUS) IMPLANT
CANISTER WOUND CARE 500ML ATS (WOUND CARE) ×2 IMPLANT
CATH FOLEY 2WAY SLVR  5CC 16FR (CATHETERS)
CATH FOLEY 2WAY SLVR 5CC 16FR (CATHETERS) IMPLANT
CLIP APPLIE 9.375 SM OPEN (CLIP) IMPLANT
CLIP VESOCCLUDE MED 24/CT (CLIP) ×2 IMPLANT
CLIP VESOCCLUDE MED 6/CT (CLIP) IMPLANT
CLIP VESOCCLUDE SM WIDE 24/CT (CLIP) IMPLANT
CNTNR URN SCR LID CUP LEK RST (MISCELLANEOUS) IMPLANT
CONN Y 3/8X3/8X3/8  BEN (MISCELLANEOUS) ×6
CONN Y 3/8X3/8X3/8 BEN (MISCELLANEOUS) ×3 IMPLANT
CONT SPEC 4OZ STRL OR WHT (MISCELLANEOUS)
COVER BACK TABLE 60X90IN (DRAPES) ×2 IMPLANT
COVER BACK TABLE 80X110 HD (DRAPES) ×2 IMPLANT
COVER SURGICAL LIGHT HANDLE (MISCELLANEOUS) ×2 IMPLANT
DRAIN CHANNEL 32F RND 10.7 FF (WOUND CARE) ×6 IMPLANT
DRAPE INCISE IOBAN 66X45 STRL (DRAPES) IMPLANT
DRAPE LAPAROSCOPIC ABDOMINAL (DRAPES) IMPLANT
DRAPE WARM FLUID 44X44 (DRAPES) IMPLANT
DRSG ADAPTIC 3X8 NADH LF (GAUZE/BANDAGES/DRESSINGS) ×2 IMPLANT
DRSG AQUACEL AG ADV 3.5X10 (GAUZE/BANDAGES/DRESSINGS) ×2 IMPLANT
DRSG AQUACEL AG ADV 3.5X14 (GAUZE/BANDAGES/DRESSINGS) IMPLANT
DRSG MEPILEX BORD LITE 2X5 (GAUZE/BANDAGES/DRESSINGS) ×2 IMPLANT
DRSG TEGADERM 4X4.5 CHG (GAUZE/BANDAGES/DRESSINGS) ×2 IMPLANT
DRSG VAC ATS LRG SENSATRAC (GAUZE/BANDAGES/DRESSINGS) ×2 IMPLANT
DRSG VAC ATS MED SENSATRAC (GAUZE/BANDAGES/DRESSINGS) IMPLANT
DRSG VAC ATS SM SENSATRAC (GAUZE/BANDAGES/DRESSINGS) IMPLANT
ELECT BLADE 4.0 EZ CLEAN MEGAD (MISCELLANEOUS) ×2
ELECT CAUTERY BLADE 6.4 (BLADE) ×2 IMPLANT
ELECT REM PT RETURN 9FT ADLT (ELECTROSURGICAL) ×2
ELECTRODE BLDE 4.0 EZ CLN MEGD (MISCELLANEOUS) ×1 IMPLANT
ELECTRODE REM PT RTRN 9FT ADLT (ELECTROSURGICAL) ×1 IMPLANT
GAUZE 4X4 16PLY RFD (DISPOSABLE) IMPLANT
GAUZE SPONGE 4X4 12PLY STRL (GAUZE/BANDAGES/DRESSINGS) IMPLANT
GAUZE SPONGE 4X4 12PLY STRL LF (GAUZE/BANDAGES/DRESSINGS) ×2 IMPLANT
GAUZE XEROFORM 5X9 LF (GAUZE/BANDAGES/DRESSINGS) IMPLANT
GLOVE BIO SURGEON STRL SZ7.5 (GLOVE) IMPLANT
GLOVE NEODERM STRL 7.5  LF PF (GLOVE) ×1
GLOVE NEODERM STRL 7.5 LF PF (GLOVE) ×1 IMPLANT
GLOVE SURG NEODERM 7.5  LF PF (GLOVE) ×1
GOWN STRL REUS W/ TWL LRG LVL3 (GOWN DISPOSABLE) ×3 IMPLANT
GOWN STRL REUS W/TWL LRG LVL3 (GOWN DISPOSABLE) ×6
HANDPIECE INTERPULSE COAX TIP (DISPOSABLE) ×2
HEMOSTAT HEMOBLAST BELLOWS (HEMOSTASIS) ×2 IMPLANT
HEMOSTAT POWDER SURGIFOAM 1G (HEMOSTASIS) IMPLANT
HEMOSTAT SURGICEL 2X14 (HEMOSTASIS) IMPLANT
HOLDER TRACH TUBE VELCRO 19.5 (MISCELLANEOUS) ×2 IMPLANT
KIT BASIN OR (CUSTOM PROCEDURE TRAY) ×2 IMPLANT
KIT SUCTION CATH 14FR (SUCTIONS) IMPLANT
KIT TUBE JEJUNAL 16FR (CATHETERS) ×2 IMPLANT
KIT TURNOVER KIT B (KITS) ×2 IMPLANT
MANIFOLD NEPTUNE II (INSTRUMENTS) ×2 IMPLANT
MARKER SKIN DUAL TIP RULER LAB (MISCELLANEOUS) IMPLANT
MICROMATRIX 1000MG (Tissue) ×2 IMPLANT
NEEDLE 22X1 1/2 (OR ONLY) (NEEDLE) IMPLANT
NS IRRIG 1000ML POUR BTL (IV SOLUTION) ×4 IMPLANT
OIL SILICONE PENTAX (PARTS (SERVICE/REPAIRS)) IMPLANT
PACK EENT II TURBAN DRAPE (CUSTOM PROCEDURE TRAY) IMPLANT
PACK GENERAL/GYN (CUSTOM PROCEDURE TRAY) ×2 IMPLANT
PACK UNIVERSAL I (CUSTOM PROCEDURE TRAY) ×2 IMPLANT
PAD ARMBOARD 7.5X6 YLW CONV (MISCELLANEOUS) IMPLANT
PENCIL BUTTON HOLSTER BLD 10FT (ELECTRODE) ×2 IMPLANT
POWDER SURGICEL 3.0 GRAM (HEMOSTASIS) ×2 IMPLANT
SET HNDPC FAN SPRY TIP SCT (DISPOSABLE) ×1 IMPLANT
SHEARS HARMONIC 9CM CVD (BLADE) ×2 IMPLANT
SOL PREP POV-IOD 4OZ 10% (MISCELLANEOUS) ×2 IMPLANT
SOLUTION PARTIC MCRMTRX 1000MG (Tissue) ×1 IMPLANT
SPONGE DRAIN TRACH 4X4 STRL 2S (GAUZE/BANDAGES/DRESSINGS) IMPLANT
SPONGE INTESTINAL PEANUT (DISPOSABLE) ×2 IMPLANT
SPONGE LAP 18X18 RF (DISPOSABLE) ×2 IMPLANT
STAPLER VISISTAT 35W (STAPLE) ×2 IMPLANT
SURGILUBE 2OZ TUBE FLIPTOP (MISCELLANEOUS) ×2 IMPLANT
SUT ETHILON 3 0 FSL (SUTURE) IMPLANT
SUT PDS AB 1 CTX 36 (SUTURE) ×2 IMPLANT
SUT PDS AB 1 TP1 96 (SUTURE) ×2 IMPLANT
SUT PROLENE 2 0 MH 48 (SUTURE) IMPLANT
SUT PROLENE 3 0 RB 1 (SUTURE) IMPLANT
SUT SILK  1 MH (SUTURE) ×10
SUT SILK 1 MH (SUTURE) ×5 IMPLANT
SUT SILK 2 0 SH (SUTURE) ×2 IMPLANT
SUT SILK 2 0 SH CR/8 (SUTURE) ×6 IMPLANT
SUT SILK 2 0 TIES 10X30 (SUTURE) ×2 IMPLANT
SUT SILK 3 0 TIES 10X30 (SUTURE) IMPLANT
SUT VIC AB 2-0 CTX 27 (SUTURE) IMPLANT
SWAB COLLECTION DEVICE MRSA (MISCELLANEOUS) IMPLANT
SWAB CULTURE ESWAB REG 1ML (MISCELLANEOUS) IMPLANT
SYR 10ML LL (SYRINGE) ×2 IMPLANT
SYR 20ML ECCENTRIC (SYRINGE) IMPLANT
SYR 30ML SLIP (SYRINGE) IMPLANT
SYR 5ML LL (SYRINGE) ×2 IMPLANT
SYR BULB IRRIG 60ML STRL (SYRINGE) ×2 IMPLANT
SYR CONTROL 10ML LL (SYRINGE) IMPLANT
SYR TOOMEY 50ML (SYRINGE) ×2 IMPLANT
SYSTEM SAHARA CHEST DRAIN ATS (WOUND CARE) ×2 IMPLANT
TAPE CLOTH SURG 4X10 WHT LF (GAUZE/BANDAGES/DRESSINGS) ×2 IMPLANT
TOWEL GREEN STERILE (TOWEL DISPOSABLE) ×2 IMPLANT
TOWEL GREEN STERILE FF (TOWEL DISPOSABLE) ×4 IMPLANT
TUBE CONNECTING 12X1/4 (SUCTIONS) IMPLANT
TUBE CONNECTING 20X1/4 (TUBING) IMPLANT
TUBE MIC GASTROSTOMY 16FR (TUBING) ×2 IMPLANT
TUBE TRACH SHILEY 8 DIST CUF (TUBING) ×2 IMPLANT
TUBING ENDO SMARTCAP (MISCELLANEOUS) IMPLANT
UNDERPAD 30X36 HEAVY ABSORB (UNDERPADS AND DIAPERS) IMPLANT
WATER STERILE IRR 1000ML POUR (IV SOLUTION) ×2 IMPLANT

## 2020-02-07 NOTE — Progress Notes (Signed)
[facility] Progress Note    Assessment/ Plan:    60M with familial cardiomyopathy EF20% s/p redo CABG 11/22/83 complicated by persistent shock, multiple attempts at sternal closure with formation of chest hematoma and tamponade, Afib with RVR, and AKI requiring CRRT.    #AKI/CKD stage III- presumably due to ischemic ATN in setting of cardiogenic shock s/p CABG.  CRRT started 01/16/20. All fluids 4K/2.5Ca.  Right femoral HD catheter placed 01/16/20 switched to [place] on 01/28/20.  - continue CRRT--> Continue goal net negative today.  # CAD s/p CABG complicated by cardiogenic shock and post-op hematoma. secondary closure on 12/23 then had to be re opened.  Went to the OR for washout on 12/28, bedside washout 02/01/19.  OR 02/20/2020 for closure, then had emergent reopening at the bedside 02/26/2020 d/t anterior chest hematoma again. Vac foam replaced. Possible chest closure today  # Cardiogenic shock- underlying familial cardiomyopathy s/p redo CABG, EF ~20%. Remains on pressors: levo and vaso. Also on milrinone. Impella P8. Continue mgmt per primary  # ABLA- s/p postoperative bleeding into chest cavity s/p re-exploration for tamponade/hematoma. Received multiple blood products yesterday  # Atrial fibrillation - s/p DCCV on 12/16.  On amiodarone.  Normal sinus rhythm currently  # VDRF- per primary svc. Possible trach today  #-Leukocytosis/ new fever: cultures redrawn 02/01/20, vanc and meropenem restarted 02/01/20 (had been off since 01/28/20)  # R loculated pleural effusion: for CT chest on 1/6 with contrast which is necessary for clinical decision making.   Subjective:    Seen in room, on CRRT. Total uf ~4.8L in the last 24 hours, net neg around 1.9L. possible chest closure, peg, trach later today. On milrinone, levo, vaso.   Objective:   BP 91/70   Pulse 77   Temp 98.4 F (36.9 C) (Esophageal)   Resp (!) 28   Ht _0  (1.905 m)   Wt 106.4 kg   SpO2 99%   BMI 29.32  kg/m   Intake/Output Summary (Last 24 hours) at 02/03/2020 1142 Last data filed at 02/05/2020 1100 Gross per 24 hour  Intake 3860.64 ml  Output 5417 ml  Net -1556.36 ml   Weight change: -1.1 kg  Physical Exam: Gen: sedated, comfortable CVS: Impella sounds obscure heart sounds CHEST: woundvac foam in place, 2 chest tubes in place, R [place] Impella Resp: coarse bilateral breath sounds Abd: no distention Ext: edema in ble ACCESS: R IJ nontunneled HD cath  Imaging: DG CHEST PORT 1 VIEW  Result Date: 02/12/2020 CLINICAL DATA:  Post CABG, history of CHF, CAD EXAM: PORTABLE CHEST 1 VIEW COMPARISON:  Numerous priors, most recent 02/06/2020 FINDINGS: Endotracheal tube tip terminates 5.8 cm from the carina. Transesophageal tube tip terminates below the margins of imaging, beyond the GE junction. Impella device remains in place within the left ventricle. Dual lumen right IJ catheter tip terminates at the superior cavoatrial junction. Bilateral pleural and mediastinal drains remain in place. Pacer/defibrillator battery pack overlies the left chest wall with lead at the cardiac apex. Persistent hazy opacities throughout the right lung and minimally within the left lung base likely reflecting some asymmetric pulmonary edema with small persistent effusions. More lobular opacity along the right lung apex. No pneumothorax. Stable cardiomediastinal contours. No acute osseous or soft tissue abnormality. IMPRESSION: No significant interval change with persistent cardiomegaly and findings of pulmonary edema, bilateral effusions, and a lobular attenuation in the right lung apex which may reflect a loculated effusion or extrapleural hematoma. Lines and tubes remain in stable,  satisfactory positioning as above. Electronically Signed   By: Lovena Le M.D.   On: 02/15/2020 06:00   DG CHEST PORT 1 VIEW  Result Date: 02/06/2020 CLINICAL DATA:  Status post CABG EXAM: PORTABLE CHEST 1 VIEW COMPARISON:  Chest x-rays dated  02/05/2020 and 02/04/2020. FINDINGS: Stable cardiomegaly. Support apparatus, including bilateral chest tubes, appears stable in position. Patchy hazy opacities throughout the RIGHT lung, stable, most likely asymmetric pulmonary edema. Persistent bibasilar opacities, likely a combination of atelectasis and small pleural effusions. Stable rounded opacity at the RIGHT lung apex, most likely loculated pleural or extrapleural effusion/collection. IMPRESSION: 1. Overall, no significant interval change. 2. Stable cardiomegaly and pulmonary edema. 3. Stable bibasilar opacities, likely combination of atelectasis and small pleural effusions. 4. Stable rounded opacity at the RIGHT lung apex, new since chest x-ray of 01/30/2020, suspected loculated pleural or extrapleural effusion/collection, possible soft tissue hematoma given the overlying surgical staples. 5. Support apparatus appears stable in position. Electronically Signed   By: Franki Cabot M.D.   On: 02/06/2020 08:50    Labs: BMET Recent Labs  Lab 02/04/20 0418 02/04/20 0420 02/04/20 1101 02/04/20 1109 02/04/20 1600 02/05/20 0524 02/05/20 1604 02/06/20 0448 02/06/20 1640 02/15/2020 0234  NA 136   < > 135 136 134* 135 134* 133* 134* 134*  K 5.2*   < > 4.9 5.0 4.6 4.3 4.2 3.9 3.8 3.9  CL 105  --  103  --  103 101 103 103 102 102  CO2 24  --   --   --  _0 GLUCOSE 175*  --  213*  --  192* 193* 196* 153* 166* 152*  BUN 25*  --  27*  --  28* 29* 28* 24* 24* 23*  CREATININE 1.57*  --  1.60*  --  1.59* 1.55* 1.45* 1.47* 1.53* 1.43*  CALCIUM 8.2*  --   --   --  8.0* 7.9* 8.0* 8.0* 7.9* 8.1*  PHOS 4.3  --   --   --  4.0 3.5 2.8 2.0* 1.7* 1.8*   < > = values in this interval not displayed.   CBC Recent Labs  Lab 02/04/20 0418 02/04/20 0420 02/05/20 0524 02/05/20 1707 02/06/20 0448 02/06/20 1402 02/13/2020 0234  WBC 19.2*   < > 16.2*  --  11.5* 11.8* 10.1  NEUTROABS 14.7*  --  12.3*  --  9.1*  --  7.6  HGB 7.7*   < > 7.8* 7.9*  7.7* 7.7* 7.9*  HCT 23.8*   < > 23.6* 25.0* 23.3* 24.2* 24.0*  MCV 90.8   < > 91.5  --  90.7 92.0 91.3  PLT 231   < > 190  --  136* 142* 151   < > = values in this interval not displayed.    Medications:    . vitamin C  500 mg Per Tube TID  . aspirin  324 mg Per Tube Daily  . atorvastatin  80 mg Per Tube Daily  . busPIRone  10 mg Per Tube TID  . chlorhexidine gluconate (MEDLINE KIT)  15 mL Mouth Rinse BID  . Chlorhexidine Gluconate Cloth  6 each Topical Daily  . darbepoetin (ARANESP) injection - NON-DIALYSIS  100 mcg Subcutaneous Q Sat-1800  . docusate  100 mg Per Tube BID  . feeding supplement (PROSource TF)  45 mL Per Tube TID  . fiber  1 packet Per Tube BID  . FLUoxetine  40 mg Per Tube BID  . lidocaine  1 patch Transdermal Q24H  . mouth rinse  15 mL Mouth Rinse 10 times per day  . melatonin  5 mg Per Tube Daily  . midodrine  15 mg Per Tube TID WC  . multivitamin  1 tablet Per Tube QHS  . oxyCODONE  10 mg Per Tube Q8H  . pantoprazole sodium  40 mg Per Tube Daily  . polyethylene glycol  17 g Per Tube Daily  . QUEtiapine  50 mg Per Tube BID  . sodium chloride flush  10-40 mL Intracatheter Q12H  . thiamine  100 mg Per Tube Daily  . valproic acid  250 mg Per Tube BID      Gean Quint  02/26/2020, 11:42 AM

## 2020-02-07 NOTE — Progress Notes (Signed)
NAME:  Corey Palmer, MRN:  [id number], DOB:  [birth year], LOS: 8 ADMISSION DATE:  01/26/2020, CONSULTATION DATE: 01/21/2020 REFERRING MD: Aundra Dubin, CHIEF COMPLAINT: Respiratory failure post Impella  HPI/course in hospital  50 year old man admitted with A. fib/RVR, torsades, cardiogenic shock [EF 15] requiring Impella, CABG x5, AKI requiring initiation of CRRT  Past Medical History:    has a past medical history of Anginal pain ([place]), Anxiety, Arthritis, Automatic implantable cardioverter-defibrillator in situ (2007), Bipolar disorder ([place]), CAD (coronary artery disease), Cancer ([place]) (2013), CHF (congestive heart failure) ([place]), Chondrocalcinosis of right knee (05/06/8117), Chronic systolic heart failure ([place]), Depression, Diabetes uncomplicated adult-type II, Dilated cardiomyopathy ([place]), Dyslipidemia, Dysrhythmia, GERD (gastroesophageal reflux disease), Gout, unspecified, Hepatic steatosis (2011), History of kidney stones, echocardiogram, Hypertension, Obesity (BMI 30.0-34.9), Pacemaker, Paroxysmal atrial fibrillation ([place]), Peptic ulcer, Shortness of breath, and Sleep apnea.  Significant Hospital Events:  12/2 Afib with RVR. Cardioverted in ED. AICD did not fire. Milrinone for low co ox.  12/7 ICD fired, Torsades- likely from low K of 2.8.  12/8 Cath- multivessel disease w/ low CO. Volume overloaded.  12/11 Underwent Impella 5.5 insertion for persistent symptoms of forward failure with low cardiac indices. 12/12 Back to the OR for displacement of Impella. Extubated.  12/15 CABG x4, with LIMA-LAD, SVG-PDA/PLV, SVG-OM 12/16 Emergently opened chest at bedside for tamponade, clot compressing right atrium  12/16 To OR for Exploration of chest due to bleeding  12/19 Started CRRT 12/23 Sternal Closure in OR 12/24 Worsening shock.  Chest reopened at bedside with improvement in hemodynamics.  No mediastinal hematoma. 12/25 A flutter > rapid a paced to sinus rhythm.  Remains on high-dose pressors,  inotropes 12/28 Weaning vasopressor requirements.  For chest washout today.  Back in atrial fibrillation, refractory to increased amiodarone and attempted overdrive pacing.  DCCV to NSR. Tolerating fluid removal via CRRT. 1/3: Appears euvolemic.  Adjusting glycemic control.  Chest still open so not weaning 1/4, spiking fever, Vanco meropenem resumed after culture sent from blood and sputum. 1/5 awaiting return to the OR for wound VAC dressing assessment fever curve and white blood cell count improving after antibiotics resumed the day prior, did have chest closed, developed tamponade physiology shortly after had to go back for emergent reopening of chest, return to intensive care and refractory shock 1/6: Wound VAC dressing replaced.  Still in shock.  Starting to wean sedation 1/7: Still on high-dose pressors  Consults:  PCCM, nephrology, cardiology, cardiothoracic surgery  Procedures:  12/2 R PICC >> 12/11 right ax impella 5.5 >> 12/15 ETT >> 12/15 right femoral arterial sheath >> 12/19 right femoral HD >> 12/23 chest tube-right pleural, left pleural , mediastinal  >> 1/10  Trach pending >  1/10 PEG pending >   Significant Diagnostic Tests:    Micro Data:  Blood culture 12/14 > negative  Blood culture 12/16 > negative Respiratory culture 12/20 > rare candida albicans Sternal wound culture 12/23 > no growth Fungal cx sternal wound 12/23 >> neg Fungal cx w/stain sternal wound 12/23 >> negative AFB sternal wound 12/23 >> negative 1/4 blood culture x2>>> neg 1/4 respiratory culture>>> rare candida albicans Sputum 1/9 >   Antimicrobials:  Vancomycin 12/16 >>12/31 Meropenem 12/16 >> 12/31 Vancomycin and meropenem resumed on 1/4  Interval history:  Going back to OR this PM for possible chest closure, trach, PEG  Awake on vent, tracks.  Objective   Blood pressure 92/68, pulse 90, temperature 98.4 F (36.9 C), temperature source Esophageal, resp. rate (!) 28, height 6'  3"  (1.905 m), weight 106.4 kg, SpO2 98 %. CVP:  [9 mmHg-18 mmHg] 12 mmHg  Vent Mode: PRVC FiO2 (%):  [40 %] 40 % Set Rate:  [28 bmp] 28 bmp Vt Set:  [510 mL] 510 mL PEEP:  [5 cmH20] 5 cmH20 Pressure Support:  [5 cmH20] 5 cmH20 Plateau Pressure:  [23 cmH20-27 cmH20] 25 cmH20   Intake/Output Summary (Last 24 hours) at 02/27/2020 1002 Last data filed at 02/23/2020 0955 Gross per 24 hour  Intake 3751.57 ml  Output 5137 ml  Net -1385.43 ml   Filed Weights   02/05/20 0500 02/06/20 0500 02/18/2020 0500  Weight: 110 kg 107.5 kg 106.4 kg   CVP:  [9 mmHg-18 mmHg] 12 mmHg  Examination: General: middle aged man laying in bed intubated, awake on vent HEENT: [place]/AT, eyes anicteric, oral mucosa moist. ETT, NGT Pulmonary: Rhonchi bilaterally Cardiac: Impella P8 R axillary. Wound vac w/ open chest. Abdomen: soft, NT, ND Extremities:pitting edema in all extremities Neuro: RASS -1, tracks with his eyes Derm: pallor, no rashes   CXR> persistent R sided loculated effusions   Assessment & Plan:   Acute hypoxic and hypercapnic respiratory failure requiring mechanical ventilation following cardiac surgery. Persistent right sided loculated pleural effusions- apically and in the base HAP- organism unknown -Con't LTVV, 4-8cc/kg IBW with goal Pplat<30 and DP<15 -Trach planned for today in OR with TCTS -CT to evaluate apical fluid collection has been deferred due to instability. Eventually needs evaluation. -PAD protocol for sedation -VAP prevention protocol. -Wean FiO2 as able to maintain SPO2 greater than 90%.  Cardiogenic shock s/p high risk CABG. Progressive shock on 1/5 secondary to obstructive physiology from hematoma after attempted chest closure necessitating emergent reopening of chest with reevacuation Post cardiac surgery RV dysfunction CAD s/p CABG, c/b cardiac tamponade, hematoma evac.  Open chest with VAC.   -Continue Impella P8 w/ heparin purge. LDH climbing raising concern for  increasing hemolysis. Con't to monitor. -Continue milrinone, norepinephrine infusions. Off epi. -Continue amiodarone infusion -Continue aspirin and statin. -Needs significant volume removal; making progress tolerating UF.  Sepsis/septic shock, likely HAP -con't vanc, meropenem; deescalate when able -Repeat trach aspirate culture pending  Paroxysmal Afib; Currently normal sinus -Continuing IV heparin purge with impella. Systemic drip on hold due to mediastinal bleeding. -Continuing IV amiodarone -Continuing telemetry monitoring  Acute kidney injury with anuria; likely cardiorenal syndrome -Con't CVVHD per nephrology.  Hypophosphatemia. -[place]  DM2 with hyperglycemia -insulin gtt  Acute blood loss anemia, anemia of critical illness Acute thrombocytopenia, likely due to acute illness, possibly due to consumption from impella -Continue to trend daily CBC -transfusions per TCTS -platelet transfusion if <100K with major bleeding  Deconditioning -Trach planned for today 1/10 -will need aggressive rehabilitation after prolonged critical illness  Daily Goals Checklist  Pain/Anxiety/Delirium protocol (if indicated): PAD protocol VAP protocol (if indicated): bundle in place. DVT prophylaxis:  On hold 2/2 bleeding Nutrition Status: high nutritional risk. EN per [place]. GI prophylaxis: pantoprazole Glucose control: insulin gtt Mobility/therapy needs: bed rest  Code Status: full  Family Communication: wife Disposition: ICU  CC time: 40 min.   Montey Hora, [place] [facility] For pager details, please see AMION 02/17/2020, 10:19 AM

## 2020-02-07 NOTE — Progress Notes (Signed)
Echocardiogram Echocardiogram Transesophageal has been performed.  Oneal Deputy Zauria Dombek 02/27/2020, 3:48 PM

## 2020-02-07 NOTE — Anesthesia Preprocedure Evaluation (Signed)
Anesthesia Evaluation  Patient identified by MRN, date of birth, ID band Patient awake and Patient unresponsive  General Assessment Comment:Please see previous charts  Reviewed: Allergy & Precautions, H&P , NPO status , Patient's Chart, lab work & pertinent test results  Airway Mallampati: Intubated  TM Distance: >3 FB Neck ROM: Full    Dental no notable dental hx.    Pulmonary neg pulmonary ROS,    Pulmonary exam normal breath sounds clear to auscultation       Cardiovascular hypertension, + CAD and +CHF  Normal cardiovascular exam+ pacemaker + Cardiac Defibrillator  Rhythm:Regular Rate:Normal     Neuro/Psych negative neurological ROS  negative psych ROS   GI/Hepatic Neg liver ROS, GERD  ,  Endo/Other  diabetes  Renal/GU Renal disease  negative genitourinary   Musculoskeletal negative musculoskeletal ROS (+)   Abdominal   Peds negative pediatric ROS (+)  Hematology negative hematology ROS (+)   Anesthesia Other Findings   Reproductive/Obstetrics negative OB ROS                             Anesthesia Physical Anesthesia Plan  ASA: IV  Anesthesia Plan: General   Post-op Pain Management:    Induction: Intravenous  PONV Risk Score and Plan: 0  Airway Management Planned: Oral ETT  Additional Equipment: Arterial line and PA Cath  Intra-op Plan:   Post-operative Plan: Post-operative intubation/ventilation  Informed Consent: I have reviewed the patients History and Physical, chart, labs and discussed the procedure including the risks, benefits and alternatives for the proposed anesthesia with the patient or authorized representative who has indicated his/her understanding and acceptance.     Dental advisory given  Plan Discussed with: CRNA and Surgeon  Anesthesia Plan Comments: (Critically ill male, who has been in the ICU greater than 2 weeks. Multiple bring backs for  washouts, and sternal closure)        Anesthesia Quick Evaluation

## 2020-02-07 NOTE — Brief Op Note (Signed)
02/05/2020  6:07 PM  PATIENT:  Corey Palmer  50 y.o. male  PRE-OPERATIVE DIAGNOSIS:  OPEN CHEST  POST-OPERATIVE DIAGNOSIS:  OPEN CHEST  PROCEDURE:  Procedure(s): STERNAL WASHOUT WITH WOUND VAC CHANGE (N/A) GREATER OMENTAL FLAP CLOSURE OF STERNUM (N/A) OPEN TRACHEOSTOMY USING #8 SHILEY (N/A) OPEN INSERTION OF 16 FR. BOLUS GASTROSTOMY TUBE (N/A) 16 FR JEJUNOSTOMY INSERTION  (N/A) TRANSESOPHAGEAL ECHOCARDIOGRAM (TEE) (N/A)  SURGEON:  Surgeon(s) and Role:    * [name], MD - Primary  PHYSICIAN ASSISTANT:   ASSISTANTS: staff   ANESTHESIA:   general  EBL:  150 mL   BLOOD ADMINISTERED:none  DRAINS: 3 chest tubes  LOCAL MEDICATIONS USED:  NONE  SPECIMEN:  No Specimen  DISPOSITION OF SPECIMEN:  N/A  COUNTS:  YES  TOURNIQUET:  * No tourniquets in log *  DICTATION: .Note written in EPIC  PLAN OF CARE: Admit to inpatient   PATIENT DISPOSITION:  ICU - intubated and critically ill.   Delay start of Pharmacological VTE agent (>24hrs) due to surgical blood loss or risk of bleeding: yes

## 2020-02-07 NOTE — Progress Notes (Signed)
[place] for Heparin Indication: Impella 5.5  Allergies  Allergen Reactions  . Orange Fruit Anaphylaxis, Hives and Other (See Comments)    Per Pt- Blisters around lips and Hives all over, also  . Penicillins Anaphylaxis    Did it involve swelling of the face/tongue/throat, SOB, or low BP? Yes Did it involve sudden or severe rash/hives, skin peeling, or any reaction on the inside of your mouth or nose? Yes Did you need to seek medical attention at a hospital or doctor's office? No When did it last happen?childhood If all above answers are "NO", may proceed with cephalosporin use.  [name] [Empagliflozin] Itching  . Basaglar Kwikpen [Insulin Glargine] Nausea And Vomiting    Patient Measurements: Height: 6\' 3"  (190.5 cm) Weight: 106.4 kg (234 lb 9.1 oz) IBW/kg (Calculated) : 84.5 Heparin dosing wt: 101 kg  Vital Signs: Temp: 98.4 F (36.9 C) (01/10 0845) Temp Source: Esophageal (01/10 0845) BP: 92/68 (01/10 0915) Pulse Rate: 90 (01/10 0915)  Labs: Recent Labs    02/05/20 0524 02/05/20 1604 02/06/20 0448 02/06/20 1402 02/06/20 1640 02/04/2020 0234  HGB 7.8*   < > 7.7* 7.7*  --  7.9*  HCT 23.6*   < > 23.3* 24.2*  --  24.0*  PLT 190  --  136* 142*  --  151  HEPARINUNFRC <0.10*  --  <0.10*  --   --  <0.10*  CREATININE 1.55*   < > 1.47*  --  1.53* 1.43*   < > = values in this interval not displayed.    Estimated Creatinine Clearance: 80.6 mL/min (A) (by C-G formula based on SCr of 1.43 mg/dL (H)).   Assessment: 50 yo male s/p impella 5.5 placement. Pt started on heparinized purge and systemic heparin. Pt now s/p CABG 12/15 with Impella remaining in place c/b significant bleeding and hematoma requiring opened chest and reexploration in OR 12/16. Chest closed on 12/23, re-opened on 12/24. Underwent cardioversion in OR 12/28, s/p sternal wash out/application of wound VAC.   Underwent sternal washout, wound VAC change, followed by  sternal closure on 1/5. Chest was re-opened in evening given compromised hemodynamics.   Impella on P8 with stable purge pressures (400-500), purge flow rate 8.9 ml/hr (only on heparin in purge solution for now). Hgb 7.9, plt 151 LDH 316 > 408.  Heparin level remains undetectable as expected.  Goal of Therapy:  Monitor platelets by anticoagulation protocol: Yes   Plan:  Getting heparin (445 units/hr) in purge solution. Not adding systemic heparin for now. F/u daily CBC, heparin level.  [name], [name] Reason, [facility] Clinical Pharmacist  02/10/2020 9:24 AM   [facility] pharmacy phone numbers are listed on [email or web address]

## 2020-02-07 NOTE — Transfer of Care (Signed)
Immediate Anesthesia Transfer of Care Note  Patient: Corey Palmer  Procedure(s) Performed: STERNAL WASHOUT WITH WOUND VAC CHANGE (N/A ) GREATER OMENTAL FLAP CLOSURE OF STERNUM (N/A ) OPEN TRACHEOSTOMY USING #8 SHILEY (N/A ) OPEN INSERTION OF 16 FR. BOLUS GASTROSTOMY TUBE (N/A ) 16 FR JEJUNOSTOMY INSERTION  (N/A ) TRANSESOPHAGEAL ECHOCARDIOGRAM (TEE) (N/A )  Patient Location: ICU  Anesthesia Type:General  Level of Consciousness: Patient remains intubated per anesthesia plan  Airway & Oxygen Therapy: Patient remains intubated per anesthesia plan and Patient placed on Ventilator (see vital sign flow sheet for setting)  Post-op Assessment: Report given to RN and Post -op Vital signs reviewed and stable  Post vital signs: Reviewed and stable  Last Vitals:  Vitals Value Taken Time  BP 108/68 02/14/2020 1826  Temp    Pulse 86 02/26/2020 1826  Resp 28 02/25/2020 1826  SpO2 100 % 02/24/2020 1826  Vitals shown include unvalidated device data.  Last Pain:  Vitals:   02/03/2020 1215  TempSrc: Esophageal  PainSc:       Patients Stated Pain Goal: 0 (36/46/80 3212)  Complications: No complications documented.

## 2020-02-07 NOTE — Anesthesia Postprocedure Evaluation (Signed)
Anesthesia Post Note  Patient: Corey Palmer  Procedure(s) Performed: STERNAL WASHOUT WITH WOUND VAC CHANGE (N/A ) GREATER OMENTAL FLAP CLOSURE OF STERNUM (N/A ) OPEN TRACHEOSTOMY USING #8 SHILEY (N/A ) OPEN INSERTION OF 16 FR. BOLUS GASTROSTOMY TUBE (N/A ) 16 FR JEJUNOSTOMY INSERTION  (N/A ) TRANSESOPHAGEAL ECHOCARDIOGRAM (TEE) (N/A )     Patient location during evaluation: SICU Anesthesia Type: General Level of consciousness: sedated Pain management: pain level controlled Vital Signs Assessment: post-procedure vital signs reviewed and stable Respiratory status: patient remains intubated per anesthesia plan Cardiovascular status: stable Postop Assessment: no apparent nausea or vomiting Anesthetic complications: no   No complications documented.  Last Vitals:  Vitals:   02/10/2020 1835 02/25/2020 1845  BP: 104/74   Pulse: 85 83  Resp: (!) 28 (!) 28  Temp:    SpO2: 100% 100%    Last Pain:  Vitals:   02/10/2020 1215  TempSrc: Esophageal  PainSc:                  Augustus Zurawski DANIEL

## 2020-02-07 NOTE — Op Note (Signed)
Procedure(s): STERNAL WASHOUT WOUND VAC CHANGE STERNAL CLOSURE TRANSESOPHAGEAL ECHOCARDIOGRAM (TEE) Procedure Note  Corey Palmer male 50 y.o. 01/30/2020 Procedure(s) and Anesthesia Type:    * STERNAL WASHOUT - General    * WOUND VAC CHANGE - General    * STERNAL CLOSURE - General    * TRANSESOPHAGEAL ECHOCARDIOGRAM (TEE) - General  Surgeon(s) and Role:    * [name], MD - Primary   Indications: The patient is status post coronary bypass grafting in December with a open chest.  He is taken to the operating room for sternal closure.     Surgeon: [name]   Assistants: Staff  Anesthesia: General endotracheal anesthesia  ASA Class: 5    Procedure Detail  STERNAL WASHOUT, WOUND VAC CHANGE, STERNAL CLOSURE, TRANSESOPHAGEAL ECHOCARDIOGRAM (TEE) After informed consent was obtained, the patient was taken to the operating room on the above listed date.  He was placed in the supine position on the operating table.  Anesthesia was begun a smooth fashion by the general endotracheal technique.  After confirming adequate anesthesia the anterior chest and abnormal cleansed and draped as a sterile field using Betadine paint and soap.  Preop surgical pause was performed.  The mediastinum was copiously irrigated.  The bone and soft tissue are gently debrided sharply.  This was then excisional debridement using scissors of the presternal tissue but not of the bone.  The sternum was reapproximated using stainless steel wires in a standard fashion.  A wound VAC was cut to fit the sternal defect and covered with a VAC drape.  This concluded the procedure.  All sponge instrument and needle counts were correct.  Estimated Blood Loss:  Minimal         Drains: Chest tubes as previously placed         Blood Given: none          Specimens: None         Implants: none        Complications:  * No complications entered in OR log *         Disposition: ICU, stable          Condition: stable

## 2020-02-07 NOTE — Progress Notes (Addendum)
Patient ID: Corey Palmer, male   DOB: 16-Mar-1970, 50 y.o.   MRN: [id number]     Advanced Heart Failure Rounding Note  PCP-Cardiologist: No primary care provider on file.   Subjective:    - 12/7 Torsades -ICD shock x1.  - 12/11 Impella 5.5 placed.  - 12/12 Impella repositioned in OR - 12/15 CABG x 4 with LIMA-LAD, SVG-PDA/PLV, SVG-OM - 12/16 DCCV afib.  Back to OR to re-open chest and again to evacuate hematoma.  - 12/19 CVVH initiated  - 12/23 Chest closed - 12/24 Chest reopened - 12/26 RAP back into NSR - 12/27 back in atrial fibrillation, unable to rapid atrial pace out.   - 12/28 To OR, chest partially closed and wound vac placed.  DCCV to NSR.  - 12/31 Antibiotics stopped. Swan removed, HD catheter moved from right femoral to right IJ.  - 1/5 To OR for chest closure. Chest reopened emergently at bedside again to evacuate hematoma     Sitting up in bed. Awake on Vent.   Weaning pressors, NE down to 17, off epinephrine, remains on VP 0.04 and Milrinone 0.125. Impella at P-8. Flow 4.4. No alarms.   Co-ox 73%. CVP 12-13. On CVVH currently pulling 100 cc/hr UF   WBC 27>>25>>24>>19>>16>>11.5>>10.1K, AF.  Remains on vancomycin/meropenem.   Heparin through impella purge. Hgb 7.9 today.   On Amio gtt 41. Remains in NSR.   Getting TFs    Impella 5.5 P-8  Flow 4.4 Waveforms ok. No alarms LDH 332 => 381=> 396=>408    Objective:   Weight Range: 106.4 kg Body mass index is 29.32 kg/m.   Vital Signs:   Temp:  [97.7 F (36.5 C)-98.6 F (37 C)] 98.4 F (36.9 C) (01/10 0845) Pulse Rate:  [46-99] 96 (01/10 0845) Resp:  [19-32] 20 (01/10 0845) BP: (55-117)/(36-95) 106/83 (01/10 0845) SpO2:  [93 %-100 %] 100 % (01/10 0845) Arterial Line BP: (64-131)/(41-72) 109/67 (01/10 0845) FiO2 (%):  [40 %] 40 % (01/10 0752) Weight:  [106.4 kg] 106.4 kg (01/10 0500) Last BM Date: 02/01/2020  Weight change: Filed Weights   02/05/20 0500 02/06/20 0500 02/03/2020 0500  Weight: 110 kg  107.5 kg 106.4 kg    Intake/Output:   Intake/Output Summary (Last 24 hours) at 02/14/2020 0902 Last data filed at 02/20/2020 0800 Gross per 24 hour  Intake 3791.65 ml  Output 5095 ml  Net -1303.35 ml      Physical Exam   CVP 12-13 General:  Awake on vent, open chest, intubated  Neck: JVP 10 cm, no thyromegaly or thyroid nodule. + Rt IJ HD cath, Rt axillary impella  Lungs: Intubated, Decreased at bases. No wheezing  CV: open chest. + CTs, Nondisplaced PMI.  Heart regular S1/S2, no S3/S4, no murmur.  2+ edema to thighs Abdomen: Soft, nontender, no hepatosplenomegaly, no distention.  Skin: Intact without lesions or rashes.  Neurologic: awake on vent and follow commands.  Extremities: 2+ bilateral LEE up to thighs, No clubbing or cyanosis.  HEENT: + ETT, + Cor-trak, otherwise normal     Telemetry   NSR 80s (personally reviewed)  Labs    CBC Recent Labs    02/06/20 0448 02/06/20 1402 02/01/2020 0234  WBC 11.5* 11.8* 10.1  NEUTROABS 9.1*  --  7.6  HGB 7.7* 7.7* 7.9*  HCT 23.3* 24.2* 24.0*  MCV 90.7 92.0 91.3  PLT 136* 142* 212   Basic Metabolic Panel Recent Labs    02/06/20 0448 02/06/20 1640 02/25/2020 0234  NA 133* 134*  134*  K 3.9 3.8 3.9  CL 103 102 102  CO2 _0 GLUCOSE 153* 166* 152*  BUN 24* 24* 23*  CREATININE 1.47* 1.53* 1.43*  CALCIUM 8.0* 7.9* 8.1*  MG 2.2  --  2.2  PHOS 2.0* 1.7* 1.8*   Liver Function Tests Recent Labs    02/06/20 1640 01/31/2020 0234  ALBUMIN 1.8* 1.8*   No results for input(s): LIPASE, AMYLASE in the last 72 hours. Cardiac Enzymes No results for input(s): CKTOTAL, CKMB, CKMBINDEX, TROPONINI in the last 72 hours.  BNP: BNP (last 3 results) Recent Labs    01/11/2020 1619  BNP 577.5*    ProBNP (last 3 results) No results for input(s): PROBNP in the last 8760 hours.   D-Dimer No results for input(s): DDIMER in the last 72 hours. Hemoglobin A1C No results for input(s): HGBA1C in the last 72 hours. Fasting Lipid  Panel No results for input(s): CHOL, HDL, LDLCALC, TRIG, CHOLHDL, LDLDIRECT in the last 72 hours. Thyroid Function Tests No results for input(s): TSH, T4TOTAL, T3FREE, THYROIDAB in the last 72 hours.  Invalid input(s): FREET3  Other results:   Imaging    DG CHEST PORT 1 VIEW  Result Date: 02/09/2020 CLINICAL DATA:  Post CABG, history of CHF, CAD EXAM: PORTABLE CHEST 1 VIEW COMPARISON:  Numerous priors, most recent 02/06/2020 FINDINGS: Endotracheal tube tip terminates 5.8 cm from the carina. Transesophageal tube tip terminates below the margins of imaging, beyond the GE junction. Impella device remains in place within the left ventricle. Dual lumen right IJ catheter tip terminates at the superior cavoatrial junction. Bilateral pleural and mediastinal drains remain in place. Pacer/defibrillator battery pack overlies the left chest wall with lead at the cardiac apex. Persistent hazy opacities throughout the right lung and minimally within the left lung base likely reflecting some asymmetric pulmonary edema with small persistent effusions. More lobular opacity along the right lung apex. No pneumothorax. Stable cardiomediastinal contours. No acute osseous or soft tissue abnormality. IMPRESSION: No significant interval change with persistent cardiomegaly and findings of pulmonary edema, bilateral effusions, and a lobular attenuation in the right lung apex which may reflect a loculated effusion or extrapleural hematoma. Lines and tubes remain in stable, satisfactory positioning as above. Electronically Signed   By: Lovena Le M.D.   On: 02/06/2020 06:00     Medications:     Scheduled Medications: . vitamin C  500 mg Per Tube TID  . aspirin  324 mg Per Tube Daily  . atorvastatin  80 mg Per Tube Daily  . busPIRone  10 mg Per Tube TID  . chlorhexidine gluconate (MEDLINE KIT)  15 mL Mouth Rinse BID  . Chlorhexidine Gluconate Cloth  6 each Topical Daily  . darbepoetin (ARANESP) injection -  NON-DIALYSIS  100 mcg Subcutaneous Q Sat-1800  . docusate  100 mg Per Tube BID  . feeding supplement (PROSource TF)  45 mL Per Tube TID  . fiber  1 packet Per Tube BID  . FLUoxetine  40 mg Per Tube BID  . lidocaine  1 patch Transdermal Q24H  . mouth rinse  15 mL Mouth Rinse 10 times per day  . melatonin  5 mg Per Tube Daily  . midodrine  15 mg Per Tube TID WC  . multivitamin  1 tablet Per Tube QHS  . oxyCODONE  10 mg Per Tube Q8H  . pantoprazole sodium  40 mg Per Tube Daily  . polyethylene glycol  17 g Per Tube Daily  . QUEtiapine  50 mg Per Tube BID  . sodium chloride flush  10-40 mL Intracatheter Q12H  . thiamine  100 mg Per Tube Daily  . valproic acid  250 mg Per Tube BID    Infusions: .  prismasol BGK 4/2.5 500 mL/hr at 02/25/2020 0805  .  prismasol BGK 4/2.5 200 mL/hr at 02/06/20 2214  . sodium chloride Stopped (01/29/20 0955)  . sodium chloride    . amiodarone 30 mg/hr (02/11/2020 0800)  . electrolyte-A Stopped (02/03/20 0702)  . epinephrine Stopped (02/06/20 0334)  . feeding supplement (PIVOT 1.5 CAL) Stopped (02/06/20 1200)  . fentaNYL infusion INTRAVENOUS 300 mcg/hr (02/23/2020 0800)  . impella catheter heparin 50 unit/mL in dextrose 5%    . insulin 0.7 mL/hr at 02/19/2020 0800  . lactated ringers 20 mL/hr at 02/06/2020 0800  . meropenem (MERREM) IV Stopped (02/03/2020 0639)  . midazolam Stopped (02/04/20 0404)  . milrinone 0.125 mcg/kg/min (02/12/2020 0800)  . norepinephrine (LEVOPHED) Adult infusion 17 mcg/min (02/13/2020 0800)  . prismasol BGK 4/2.5 1,500 mL/hr at 02/14/2020 0800  . vancomycin Stopped (02/06/20 1538)  . vasopressin 0.04 Units/min (02/09/2020 0302)    PRN Medications: sodium chloride, Place/Maintain arterial line **AND** sodium chloride, artificial tears, bisacodyl **OR** bisacodyl, docusate, fentaNYL, fentaNYL (SUBLIMAZE) injection, fentaNYL (SUBLIMAZE) injection, heparin, heparin, metoprolol tartrate, midazolam, midazolam, ondansetron (ZOFRAN) IV, polyethylene  glycol, sodium chloride flush     Assessment/Plan   1. Cardiogenic shock/acute on chronic systolic CHF: Initially nonischemic cardiomyopathy. Possible familial cardiomyopathy as both parents had cardiomyopathy and died at around 60.However, [employer] gene testing did not show any common mutation for cardiomyopathy.  However, this admission noted to have severe 3 vessel disease so suspect component of ischemic cardiomyopathy. [employer] ICD. Echo in 8/20 with EF 15% and mildly decreased RV function. Echo this admission with EF < 20%, moderate LV dilation, RV mildly reduced, severe LAE, no significant MR.Low output HF with markedly low EF.  He has a long history of cardiomyopathy (20 yrs), tends to minimize symptoms.  Cardiorenal syndrome with creatinine up to 2.2,  stabilized on milrinone and Impella 5.5. SCr 1.44 day of CABG. CABG 12/15.  Post-op shock, back to OR 12/16 to open chest (chest wall compartment syndrome) and again to evacuate hematoma.  TEE post-op with severe RV dysfunction.  CVVHD initiated 12/19 for volume removal in setting of rising Scr/decreased UOP. Suspect ATN. S/p chest closure 12/23. Hemodynamics much worse on 12/24 with rising pressor demands. Co-ox 36%.  Bedside echo severe biventricular dysfunction with marked septal bounce. Chest re-opened 12/24.  Was atrial paced back to NSR on 12/26 with improvement in hemodynamics but back in atrial fibrillation on 12/27 and unable to pace out, DCCV to NSR on 12/28. Went back to OR 1/5 for chest closure but several hours later required emergent re-opening of chest at bedside to evacuate hematoma. Remains tenuous. Weaning pressor support. On NE 17, VP 0.04, off epinephrine. Milrinone 0.125. Impella at P-8 with good position on 1/7 echo. Flow 4.4. No alarms. Co-ox 73%. CVP 12-13, remains edematous (3rd spacing).  - Continue slow wean of NE.  - Continue milrinone at 0.125.  - Continue Impella at P-8 - Weight down, aim to pull net negative 100  cc/hr UF approximately today via CVVH.   2. Atrial fibrillation: H/o PAF.  He was on dofetilide in the remote past but this was stopped due to noncompliance. He had an upper GI bleed from antral ulcers in 3/12. He was seen by GI and was deemed safe   to restart anticoagulation as long as he remains on a PPI.  No apparent recurrence of AF until just prior to this admission, was cardioverted back to NSR in ER but went back into atrial fibrillation.  Post-op afib, DCCV to NSR/BiV pacing am 12/16 -> back to AF. Rapid atrial pacing back to SR on 12/26 -> back in AF on 12/27 and unable to pace out. 12/28 DCCV to NSR.  Maintaining NSR.  - Continue IV amiodarone 30 mg/hr while on pressors.  - Currently off systemic heparin with chest bleeding. Getting heparin through impella purge  - Did not have Maze with CABG.    3. CAD:   Cath this admission with severe 3 vessel disease. CABG x 4 on 12/15.  - Continue atorvastatin, ASA.  - continue current management 4. Acute on chronic hypoxemic respiratory failure: Vent per CCM.  - CCM following - Will need tracheostomy when chest fully closed.  5. AKI on CKD stage 3: Likely ATN/cardiorenal. Anuric. On CVVH.    - As above, aim for net negative 100 cc/hr via CVVH today. 6. Diabetes: Insulin.  7. Hyponatremia: Resolved with CVVH.  8. Torsades/ICD shock: 12/7/212 in hospital event, in setting of severe hypokalemia and hypomagnesemia.   9. Gout: h/o gout. Complained of rt knee pain c/w previous flares - treated w/ prednisone burst (completed).  10: ID: Treating for PNA and also with chest wall open.   Afebrile.  Cultures NGTD.   - 1/4 restarted vancomycin and meropenem.  11. FEN: Back on TFs.  12. Anemia: Given 1u PRBCs 01/30/19 Hgb 7.5>8.1 >9.9 >7.9>.9.9>7.5>7.8>7.7>.7.9.  Transfuse hgb < 7.5.  13. Stage II Pressure Ulcer- sacrum -Reposition every 2 hours R/L  14. Pleural Effusion  - loculated rt apical pleural effusion noted on imaging.  - continue UF through CVVH   - PCCM following   Brittainy Simmons, PA-C  02/23/2020 9:02 AM  Patient seen with PA, agree with the above note.   Stable today, gradually weaning pressors. Down to NE 17.  Impella flow stable no alarms. CVP 12-13.  UF 50-100 cc/hr net today.   General: NAD Neck: JVP 10 cm, no thyromegaly or thyroid nodule.  Lungs: Clear to auscultation bilaterally with normal respiratory effort. CV: Nondisplaced PMI.  Heart regular S1/S2, no S3/S4, no murmur.  1+ edema to knees. Abdomen: Soft, nontender, no hepatosplenomegaly, no distention.  Skin: Intact without lesions or rashes.  Neurologic: Follows commands when awakens Extremities: No clubbing or cyanosis.  HEENT: Normal.   Continue slow wean of NE.    Aim for I/Os net 50-100 cc/hr today as long as MAP tolerates.  CVP 12-13.   Back to OR today for chest washout, trach.  Will assess position of Impella in OR.   CRITICAL CARE Performed by:    Total critical care time: 35 minutes  Critical care time was exclusive of separately billable procedures and treating other patients.  Critical care was necessary to treat or prevent imminent or life-threatening deterioration.  Critical care was time spent personally by me on the following activities: development of treatment plan with patient and/or surrogate as well as nursing, discussions with consultants, evaluation of patient's response to treatment, examination of patient, obtaining history from patient or surrogate, ordering and performing treatments and interventions, ordering and review of laboratory studies, ordering and review of radiographic studies, pulse oximetry and re-evaluation of patient's condition.    02/01/2020 9:56 AM  

## 2020-02-07 NOTE — Progress Notes (Signed)
Paged  Dr.Lighfoot  with concerns of Corey Palmer  Drainage coming from G tube and results of latest blood work  Verbal orders to transfuse 1 Unit of PRBC now and to continue monitoring G tube drainage and call back if drainage exceeded 250 ml/hr

## 2020-02-07 NOTE — H&P (Signed)
History and Physical Interval Note:  02/04/2020 12:07 PM  Corey Palmer  has presented today for surgery, with the diagnosis of OPEN CHEST.  The various methods of treatment have been discussed with the patient and family. After consideration of risks, benefits and other options for treatment, the patient has consented to  Procedure(s): STERNAL WASHOUT (N/A) WOUND VAC CHANGE (N/A) STERNAL CLOSURE (N/A) TRANSESOPHAGEAL ECHOCARDIOGRAM (TEE) (N/A); possible omental flap closure of sternum Open tracheostomy Possible open gastrostomy and feeding jejunostomy as a surgical intervention.  [name] history has been reviewed, patient examined, no change in status, stable for surgery.  I have reviewed the patient's chart and labs.  Questions were answered to the patient's satisfaction.     [name]

## 2020-02-07 NOTE — Progress Notes (Signed)
EVENING ROUNDS NOTE :     [address]       [place] [address]             [phone]                 Day of Surgery Procedure(s) (LRB): STERNAL WASHOUT WITH WOUND VAC CHANGE (N/A) GREATER OMENTAL FLAP CLOSURE OF STERNUM (N/A) OPEN TRACHEOSTOMY USING #8 SHILEY (N/A) OPEN INSERTION OF 16 FR. BOLUS GASTROSTOMY TUBE (N/A) 16 FR JEJUNOSTOMY INSERTION  (N/A) TRANSESOPHAGEAL ECHOCARDIOGRAM (TEE) (N/A)   Total Length of Stay:  LOS: 39 days  Events:   No events 4.3L of flow    BP 104/74 (BP Location: Left Arm)   Pulse 83   Temp 98.1 F (36.7 C) (Esophageal)   Resp (!) 28   Ht 6\' 3"  (1.905 m)   Wt 106.4 kg   SpO2 100%   BMI 29.32 kg/m   CVP:  [9 mmHg-19 mmHg] 11 mmHg  Vent Mode: PRVC FiO2 (%):  [40 %-100 %] 100 % Set Rate:  [28 bmp] 28 bmp Vt Set:  [510 mL] 510 mL PEEP:  [5 cmH20] 5 cmH20 Pressure Support:  [5 cmH20] 5 cmH20 Plateau Pressure:  [24 cmH20-27 cmH20] 24 cmH20  .  prismasol BGK 4/2.5 500 mL/hr at 02/19/2020 0805  .  prismasol BGK 4/2.5 200 mL/hr at 02/06/20 2214  . sodium chloride Stopped (01/29/20 0955)  . sodium chloride    . amiodarone 30 mg/hr (01/30/2020 1508)  . electrolyte-A Stopped (02/03/20 0175)  . epinephrine 20 mcg/min (02/06/2020 1833)  . feeding supplement (PIVOT 1.5 CAL) Stopped (02/06/20 1200)  . fentaNYL infusion INTRAVENOUS 100 mcg/hr (02/17/2020 1835)  . impella catheter heparin 50 unit/mL in dextrose 5%    . insulin 2.4 Units/hr (02/06/2020 1729)  . lactated ringers 20 mL/hr at 02/27/2020 1500  . meropenem (MERREM) IV Stopped (02/11/2020 1406)  . midazolam 2 mg/hr (02/23/2020 1851)  . milrinone 0.125 mcg/kg/min (02/02/2020 1508)  . norepinephrine (LEVOPHED) Adult infusion 17 mcg/min (02/20/2020 1745)  . prismasol BGK 4/2.5 1,500 mL/hr at 02/08/2020 1126  . vancomycin Stopped (02/13/2020 1326)  . vasopressin 0.04 Units/min (02/20/2020 1508)    I/O last 3 completed shifts: In: 6310.1 [I.V.:3695.9; Blood:304; Other:280.4; NG/GT:930; IV  Piggyback:1099.8] Out: 8255 [Drains:100; ZWCHE:5277; Stool:1150; Blood:150; Chest Tube:140]   CBC Latest Ref Rng & Units 02/23/2020 02/14/2020 02/04/2020  WBC 4.0 - 10.5 K/uL - - -  Hemoglobin 13.0 - 17.0 g/dL 8.8(L) 7.8(L) 8.5(L)  Hematocrit 39.0 - 52.0 % 26.0(L) 23.0(L) 25.0(L)  Platelets 150 - 400 K/uL - - -    BMP Latest Ref Rng & Units 02/28/2020 02/23/2020 02/21/2020  Glucose 70 - 99 mg/dL 208(H) 160(H) -  BUN 6 - 20 mg/dL 22(H) 20 -  Creatinine 0.61 - 1.24 mg/dL 1.50(H) 1.50(H) -  BUN/Creat Ratio 9 - 20 - - -  Sodium 135 - 145 mmol/L 133(L) 134(L) 135  Potassium 3.5 - 5.1 mmol/L 4.1 4.3 4.3  Chloride 98 - 111 mmol/L 100 99 -  CO2 22 - 32 mmol/L - - -  Calcium 8.9 - 10.3 mg/dL - - -    ABG    Component Value Date/Time   PHART 7.323 (L) 02/01/2020 1549   PCO2ART 50.9 (H) 02/08/2020 1549   PO2ART 78 (L) 02/26/2020 1549   HCO3 26.6 02/06/2020 1549   TCO2 24 02/12/2020 1723   ACIDBASEDEF 1.0 02/03/2020 0420   O2SAT 95.0 02/21/2020 1549       Corey Palmer  Corey Brood, MD 02/11/2020 7:10 PM

## 2020-02-08 ENCOUNTER — Encounter (HOSPITAL_COMMUNITY): Payer: Self-pay | Admitting: *Deleted

## 2020-02-08 ENCOUNTER — Inpatient Hospital Stay (HOSPITAL_COMMUNITY): Payer: BC Managed Care – PPO

## 2020-02-08 ENCOUNTER — Encounter (HOSPITAL_COMMUNITY): Payer: Self-pay | Admitting: Cardiothoracic Surgery

## 2020-02-08 DIAGNOSIS — I4891 Unspecified atrial fibrillation: Secondary | ICD-10-CM | POA: Diagnosis not present

## 2020-02-08 DIAGNOSIS — R570 Cardiogenic shock: Secondary | ICD-10-CM | POA: Diagnosis not present

## 2020-02-08 DIAGNOSIS — J9601 Acute respiratory failure with hypoxia: Secondary | ICD-10-CM | POA: Diagnosis not present

## 2020-02-08 DIAGNOSIS — N179 Acute kidney failure, unspecified: Secondary | ICD-10-CM | POA: Diagnosis not present

## 2020-02-08 LAB — POCT I-STAT 7, (LYTES, BLD GAS, ICA,H+H)
Acid-Base Excess: 1 mmol/L (ref 0.0–2.0)
Acid-Base Excess: 1 mmol/L (ref 0.0–2.0)
Acid-base deficit: 1 mmol/L (ref 0.0–2.0)
Bicarbonate: 24.1 mmol/L (ref 20.0–28.0)
Bicarbonate: 25.2 mmol/L (ref 20.0–28.0)
Bicarbonate: 25.1 mmol/L (ref 20.0–28.0)
Calcium, Ion: 1.15 mmol/L (ref 1.15–1.40)
Calcium, Ion: 1.14 mmol/L — ABNORMAL LOW (ref 1.15–1.40)
Calcium, Ion: 1.14 mmol/L — ABNORMAL LOW (ref 1.15–1.40)
HCT: 23 % — ABNORMAL LOW (ref 39.0–52.0)
HCT: 24 % — ABNORMAL LOW (ref 39.0–52.0)
HCT: 22 % — ABNORMAL LOW (ref 39.0–52.0)
Hemoglobin: 7.8 g/dL — ABNORMAL LOW (ref 13.0–17.0)
Hemoglobin: 8.2 g/dL — ABNORMAL LOW (ref 13.0–17.0)
Hemoglobin: 7.5 g/dL — ABNORMAL LOW (ref 13.0–17.0)
O2 Saturation: 93 %
O2 Saturation: 99 %
O2 Saturation: 95 %
Patient temperature: 98.5
Patient temperature: 98.3
Potassium: 4.6 mmol/L (ref 3.5–5.1)
Potassium: 4.7 mmol/L (ref 3.5–5.1)
Potassium: 4.3 mmol/L (ref 3.5–5.1)
Sodium: 134 mmol/L — ABNORMAL LOW (ref 135–145)
Sodium: 134 mmol/L — ABNORMAL LOW (ref 135–145)
Sodium: 135 mmol/L (ref 135–145)
TCO2: 25 mmol/L (ref 22–32)
TCO2: 26 mmol/L (ref 22–32)
TCO2: 26 mmol/L (ref 22–32)
pCO2 arterial: 39.4 mmHg (ref 32.0–48.0)
pCO2 arterial: 37 mmHg (ref 32.0–48.0)
pCO2 arterial: 36.9 mmHg (ref 32.0–48.0)
pH, Arterial: 7.396 (ref 7.350–7.450)
pH, Arterial: 7.441 (ref 7.350–7.450)
pH, Arterial: 7.442 (ref 7.350–7.450)
pO2, Arterial: 68 mmHg — ABNORMAL LOW (ref 83.0–108.0)
pO2, Arterial: 145 mmHg — ABNORMAL HIGH (ref 83.0–108.0)
pO2, Arterial: 74 mmHg — ABNORMAL LOW (ref 83.0–108.0)

## 2020-02-08 LAB — GLUCOSE, CAPILLARY
Glucose-Capillary: 138 mg/dL — ABNORMAL HIGH (ref 70–99)
Glucose-Capillary: 143 mg/dL — ABNORMAL HIGH (ref 70–99)
Glucose-Capillary: 160 mg/dL — ABNORMAL HIGH (ref 70–99)
Glucose-Capillary: 164 mg/dL — ABNORMAL HIGH (ref 70–99)
Glucose-Capillary: 167 mg/dL — ABNORMAL HIGH (ref 70–99)
Glucose-Capillary: 170 mg/dL — ABNORMAL HIGH (ref 70–99)
Glucose-Capillary: 172 mg/dL — ABNORMAL HIGH (ref 70–99)
Glucose-Capillary: 172 mg/dL — ABNORMAL HIGH (ref 70–99)
Glucose-Capillary: 172 mg/dL — ABNORMAL HIGH (ref 70–99)
Glucose-Capillary: 173 mg/dL — ABNORMAL HIGH (ref 70–99)
Glucose-Capillary: 174 mg/dL — ABNORMAL HIGH (ref 70–99)
Glucose-Capillary: 176 mg/dL — ABNORMAL HIGH (ref 70–99)
Glucose-Capillary: 184 mg/dL — ABNORMAL HIGH (ref 70–99)
Glucose-Capillary: 188 mg/dL — ABNORMAL HIGH (ref 70–99)
Glucose-Capillary: 190 mg/dL — ABNORMAL HIGH (ref 70–99)
Glucose-Capillary: 190 mg/dL — ABNORMAL HIGH (ref 70–99)
Glucose-Capillary: 190 mg/dL — ABNORMAL HIGH (ref 70–99)
Glucose-Capillary: 190 mg/dL — ABNORMAL HIGH (ref 70–99)
Glucose-Capillary: 194 mg/dL — ABNORMAL HIGH (ref 70–99)
Glucose-Capillary: 197 mg/dL — ABNORMAL HIGH (ref 70–99)
Glucose-Capillary: 197 mg/dL — ABNORMAL HIGH (ref 70–99)
Glucose-Capillary: 200 mg/dL — ABNORMAL HIGH (ref 70–99)
Glucose-Capillary: 200 mg/dL — ABNORMAL HIGH (ref 70–99)
Glucose-Capillary: 205 mg/dL — ABNORMAL HIGH (ref 70–99)
Glucose-Capillary: 206 mg/dL — ABNORMAL HIGH (ref 70–99)
Glucose-Capillary: 215 mg/dL — ABNORMAL HIGH (ref 70–99)
Glucose-Capillary: 217 mg/dL — ABNORMAL HIGH (ref 70–99)

## 2020-02-08 LAB — LACTATE DEHYDROGENASE: LDH: 392 U/L — ABNORMAL HIGH (ref 98–192)

## 2020-02-08 LAB — RENAL FUNCTION PANEL
Albumin: 1.5 g/dL — ABNORMAL LOW (ref 3.5–5.0)
Albumin: 1.5 g/dL — ABNORMAL LOW (ref 3.5–5.0)
Anion gap: 8 (ref 5–15)
Anion gap: 8 (ref 5–15)
BUN: 21 mg/dL — ABNORMAL HIGH (ref 6–20)
BUN: 21 mg/dL — ABNORMAL HIGH (ref 6–20)
CO2: 23 mmol/L (ref 22–32)
CO2: 24 mmol/L (ref 22–32)
Calcium: 7.8 mg/dL — ABNORMAL LOW (ref 8.9–10.3)
Calcium: 7.6 mg/dL — ABNORMAL LOW (ref 8.9–10.3)
Chloride: 102 mmol/L (ref 98–111)
Chloride: 102 mmol/L (ref 98–111)
Creatinine, Ser: 1.5 mg/dL — ABNORMAL HIGH (ref 0.61–1.24)
Creatinine, Ser: 1.45 mg/dL — ABNORMAL HIGH (ref 0.61–1.24)
GFR, Estimated: 56 mL/min — ABNORMAL LOW (ref >60)
GFR, Estimated: 58 mL/min — ABNORMAL LOW (ref >60)
Glucose, Bld: 137 mg/dL — ABNORMAL HIGH (ref 70–99)
Glucose, Bld: 199 mg/dL — ABNORMAL HIGH (ref 70–99)
Phosphorus: 3.7 mg/dL (ref 2.5–4.6)
Phosphorus: 3.3 mg/dL (ref 2.5–4.6)
Potassium: 4.7 mmol/L (ref 3.5–5.1)
Potassium: 4.4 mmol/L (ref 3.5–5.1)
Sodium: 133 mmol/L — ABNORMAL LOW (ref 135–145)
Sodium: 134 mmol/L — ABNORMAL LOW (ref 135–145)

## 2020-02-08 LAB — PREPARE RBC (CROSSMATCH)

## 2020-02-08 LAB — COOXEMETRY PANEL
Carboxyhemoglobin: 2 % — ABNORMAL HIGH (ref 0.5–1.5)
Methemoglobin: 1.1 % (ref 0.0–1.5)
O2 Saturation: 72.3 %
Total hemoglobin: 7.1 g/dL — ABNORMAL LOW (ref 12.0–16.0)

## 2020-02-08 LAB — CBC WITH DIFFERENTIAL/PLATELET
Abs Immature Granulocytes: 0.25 10*3/uL — ABNORMAL HIGH (ref 0.00–0.07)
Basophils Absolute: 0.1 10*3/uL (ref 0.0–0.1)
Basophils Relative: 1 %
Eosinophils Absolute: 0.3 10*3/uL (ref 0.0–0.5)
Eosinophils Relative: 2 %
HCT: 25 % — ABNORMAL LOW (ref 39.0–52.0)
Hemoglobin: 7.9 g/dL — ABNORMAL LOW (ref 13.0–17.0)
Immature Granulocytes: 2 %
Lymphocytes Relative: 6 %
Lymphs Abs: 0.9 10*3/uL (ref 0.7–4.0)
MCH: 28.8 pg (ref 26.0–34.0)
MCHC: 31.6 g/dL (ref 30.0–36.0)
MCV: 91.2 fL (ref 80.0–100.0)
Monocytes Absolute: 1.6 10*3/uL — ABNORMAL HIGH (ref 0.1–1.0)
Monocytes Relative: 10 %
Neutro Abs: 12.9 10*3/uL — ABNORMAL HIGH (ref 1.7–7.7)
Neutrophils Relative %: 79 %
Platelets: 229 10*3/uL (ref 150–400)
RBC: 2.74 MIL/uL — ABNORMAL LOW (ref 4.22–5.81)
RDW: 18.4 % — ABNORMAL HIGH (ref 11.5–15.5)
WBC: 16.1 10*3/uL — ABNORMAL HIGH (ref 4.0–10.5)
nRBC: 0.2 % (ref 0.0–0.2)

## 2020-02-08 LAB — CBC
HCT: 22 % — ABNORMAL LOW (ref 39.0–52.0)
Hemoglobin: 7.3 g/dL — ABNORMAL LOW (ref 13.0–17.0)
MCH: 29.7 pg (ref 26.0–34.0)
MCHC: 33.2 g/dL (ref 30.0–36.0)
MCV: 89.4 fL (ref 80.0–100.0)
Platelets: 246 10*3/uL (ref 150–400)
RBC: 2.46 MIL/uL — ABNORMAL LOW (ref 4.22–5.81)
RDW: 18.9 % — ABNORMAL HIGH (ref 11.5–15.5)
WBC: 14.3 10*3/uL — ABNORMAL HIGH (ref 4.0–10.5)
nRBC: 0 % (ref 0.0–0.2)

## 2020-02-08 LAB — HEPARIN LEVEL (UNFRACTIONATED): Heparin Unfractionated: <0.1 IU/mL — ABNORMAL LOW (ref 0.30–0.70)

## 2020-02-08 LAB — MAGNESIUM: Magnesium: 2.2 mg/dL (ref 1.7–2.4)

## 2020-02-08 MED ORDER — CLONAZEPAM 0.25 MG PO TBDP
0.2500 mg | ORAL_TABLET | Freq: Two times a day (BID) | ORAL | Status: DC
  Administered 2020-02-08 – 2020-02-11 (×7): 0.25 mg
  Filled 2020-02-08 (×7): qty 1

## 2020-02-08 MED ORDER — CLONAZEPAM 0.5 MG PO TABS: 0.2500 mg | ORAL_TABLET | Freq: Two times a day (BID) | ORAL | Status: DC

## 2020-02-08 MED ORDER — OXYCODONE HCL 5 MG PO TABS
10.0000 mg | ORAL_TABLET | Freq: Four times a day (QID) | ORAL | Status: DC
  Administered 2020-02-08 – 2020-02-14 (×23): 10 mg
  Filled 2020-02-08 (×26): qty 2

## 2020-02-08 MED ORDER — SODIUM CHLORIDE 0.9% IV SOLUTION: Freq: Once | INTRAVENOUS | Status: AC

## 2020-02-08 NOTE — Progress Notes (Signed)
Patient ID: Corey Palmer, male   DOB: 06/24/1970, 50 y.o.   MRN: [id number]     Advanced Heart Failure Rounding Note  PCP-Cardiologist: No primary care provider on file.   Subjective:    - 12/7 Torsades -ICD shock x1.  - 12/11 Impella 5.5 placed.  - 12/12 Impella repositioned in OR - 12/15 CABG x 4 with LIMA-LAD, SVG-PDA/PLV, SVG-OM - 12/16 DCCV afib.  Back to OR to re-open chest and again to evacuate hematoma.  - 12/19 CVVH initiated  - 12/23 Chest closed - 12/24 Chest reopened - 12/26 RAP back into NSR - 12/27 back in atrial fibrillation, unable to rapid atrial pace out.   - 12/28 To OR, chest partially closed and wound vac placed.  DCCV to NSR.  - 12/31 Antibiotics stopped. Swan removed, HD catheter moved from right femoral to right IJ.  - 1/5 To OR for chest closure. Chest reopened emergently at bedside again to evacuate hematoma  - 1/10 To OR: omental flap closure of chest with wound vac, tracheostomy, G-tube, J-tube.     Stable this morning.  Weaned off epinephrine after OR.  Currently on NE 48, vasopressin 0.04, milrinone 0.125.   Co-ox 72%. CVP 8. On CVVH pulling net 50-100 cc/hr UF   WBC 16K, AF.  Remains on vancomycin/meropenem.   Heparin through impella purge. Hgb 7.9 today.   On Amio gtt 51. Remains in NSR.    Impella 5.5 P-8  Flow 4.2 Waveforms ok. No alarms LDH 332 => 381=> 396=>408 => 392   Objective:   Weight Range: 106 kg Body mass index is 29.21 kg/m.   Vital Signs:   Temp:  [98.1 F (36.7 C)-98.7 F (37.1 C)] 98.3 F (36.8 C) (01/11 0400) Pulse Rate:  [28-102] 88 (01/11 0804) Resp:  [0-32] 28 (01/11 0804) BP: (50-125)/(32-93) 94/74 (01/11 0700) SpO2:  [89 %-100 %] 100 % (01/11 0804) Arterial Line BP: (58-137)/(46-79) 92/62 (01/11 0700) FiO2 (%):  [40 %-100 %] 50 % (01/11 0804) Weight:  [106 kg] 106 kg (01/11 0615) Last BM Date: 02/10/2020  Weight change: Filed Weights   02/06/20 0500 02/19/2020 0500 02/08/20 0615  Weight: 107.5 kg 106.4 kg  106 kg    Intake/Output:   Intake/Output Summary (Last 24 hours) at 02/08/2020 0810 Last data filed at 02/08/2020 0700 Gross per 24 hour  Intake 4676.48 ml  Output 4872 ml  Net -195.52 ml      Physical Exam   CVP 8 General: NAD on vent Neck: Trach. No JVD, no thyromegaly or thyroid nodule.  Lungs: Clear to auscultation bilaterally with normal respiratory effort. CV: Wound vac on chest.  Heart regular S1/S2, no S3/S4, no murmur.  1+ ankle edema. Abdomen: Soft, nontender, no hepatosplenomegaly, no distention.  Skin: Intact without lesions or rashes.  Neurologic: Follows commands when sedation weaned.  Extremities: No clubbing or cyanosis.  HEENT: Normal.    Telemetry   NSR 90s (personally reviewed)  Labs    CBC Recent Labs    02/13/2020 0234 02/03/2020 1549 02/28/2020 2058 02/08/20 0101 02/08/20 0355 02/08/20 0400  WBC 10.1  --  21.5*  --   --  16.1*  NEUTROABS 7.6  --   --   --   --  12.9*  HGB 7.9*   < > 7.1*   < > 8.2* 7.9*  HCT 24.0*   < > 22.8*   < > 24.0* 25.0*  MCV 91.3  --  91.2  --   --  91.2  PLT 151  --  274  --   --  229   < > = values in this interval not displayed.   Basic Metabolic Panel Recent Labs    02/01/2020 0234 02/10/2020 1549 02/01/2020 2058 02/08/20 0101 02/08/20 0355 02/08/20 0400  NA 134*   < > 131*   < > 134* 133*  K 3.9   < > 4.5   < > 4.7 4.7  CL 102   < > 101  --   --  102  CO2 24  --  22  --   --  23  GLUCOSE 152*   < > 213*  --   --  137*  BUN 23*   < > 23*  --   --  21*  CREATININE 1.43*   < > 1.62*  --   --  1.50*  CALCIUM 8.1*  --  7.4*  --   --  7.8*  MG 2.2  --   --   --   --  2.2  PHOS 1.8*  --  4.5  --   --  3.7   < > = values in this interval not displayed.   Liver Function Tests Recent Labs    02/17/2020 2058 02/08/20 0400  ALBUMIN 1.5* 1.5*   No results for input(s): LIPASE, AMYLASE in the last 72 hours. Cardiac Enzymes No results for input(s): CKTOTAL, CKMB, CKMBINDEX, TROPONINI in the last 72  hours.  BNP: BNP (last 3 results) Recent Labs    01/03/2020 1619  BNP 577.5*    ProBNP (last 3 results) No results for input(s): PROBNP in the last 8760 hours.   D-Dimer No results for input(s): DDIMER in the last 72 hours. Hemoglobin A1C No results for input(s): HGBA1C in the last 72 hours. Fasting Lipid Panel No results for input(s): CHOL, HDL, LDLCALC, TRIG, CHOLHDL, LDLDIRECT in the last 72 hours. Thyroid Function Tests No results for input(s): TSH, T4TOTAL, T3FREE, THYROIDAB in the last 72 hours.  Invalid input(s): FREET3  Other results:   Imaging    DG CHEST PORT 1 VIEW  Result Date: 02/08/2020 CLINICAL DATA:  ETT, chest tube, open sternum EXAM: PORTABLE CHEST 1 VIEW COMPARISON:  Numerous priors, most recent 02/04/2020 FINDINGS: *Tracheostomy tube terminates 5 cm from the carina. *Dual lumen right IJ approach central venous catheter tip terminates at the superior cavoatrial junction. *Right upper extremity approach Impella device remains in stable position. *Left chest wall pacer/AICD with tip at the cardiac apex. *Mediastinal and bilateral pleural drains remain in place. *Pacer pads and telemetry leads overlie the chest. Stable cardiomegaly. Persistent hazy opacities throughout the lungs accounting for differences in technique. Stable bilateral effusions as well as a more lobular opacity towards the right lung apex. No pneumothorax. No new acute osseous or soft tissue abnormalities. IMPRESSION: Lines and tubes as above. Persistent hazy opacities throughout the lungs compatible with edema. Stable bilateral effusions as well as a more indeterminate lobular opacity towards the right lung apex. Electronically Signed   By: Lovena Le M.D.   On: 02/08/2020 06:56   DG Chest Port 1 View  Result Date: 02/13/2020 CLINICAL DATA:  50 year old male with tracheostomy. EXAM: PORTABLE CHEST 1 VIEW COMPARISON:  Chest radiograph dated 02/06/2020. FINDINGS: Interval removal of the  endotracheal tube and placement of a tracheostomy with tip approximately 5.2 cm above the carina. There has been interval removal of feeding tube. The remainder of the support apparatus in similar position. No significant interval change in cardiomegaly  and bilateral interstitial prominence and edema. Right upper lobe extrapleural loculated effusion or hematoma similar or slightly increased. No pneumothorax. IMPRESSION: 1. Interval placement of a tracheostomy with tip above the carina. 2. Slight interval increase in the size of the right upper lobe loculated pleural collection. No other significant interval change in the appearance of the lungs. Electronically Signed   By: Anner Crete M.D.   On: 02/06/2020 19:02     Medications:     Scheduled Medications: . vitamin C  500 mg Per Tube TID  . aspirin  324 mg Per Tube Daily  . atorvastatin  80 mg Per Tube Daily  . busPIRone  10 mg Per Tube TID  . chlorhexidine gluconate (MEDLINE KIT)  15 mL Mouth Rinse BID  . Chlorhexidine Gluconate Cloth  6 each Topical Daily  . darbepoetin (ARANESP) injection - NON-DIALYSIS  100 mcg Subcutaneous Q Sat-1800  . docusate  100 mg Per Tube BID  . feeding supplement (PROSource TF)  45 mL Per Tube TID  . fiber  1 packet Per Tube BID  . FLUoxetine  40 mg Per Tube BID  . lidocaine  1 patch Transdermal Q24H  . mouth rinse  15 mL Mouth Rinse 10 times per day  . melatonin  5 mg Per Tube Daily  . midodrine  15 mg Per Tube TID WC  . multivitamin  1 tablet Per Tube QHS  . oxyCODONE  10 mg Per Tube Q8H  . pantoprazole sodium  40 mg Per Tube Daily  . polyethylene glycol  17 g Per Tube Daily  . QUEtiapine  50 mg Per Tube BID  . sodium chloride flush  10-40 mL Intracatheter Q12H  . thiamine  100 mg Per Tube Daily  . valproic acid  250 mg Per Tube BID    Infusions: .  prismasol BGK 4/2.5 500 mL/hr at 02/09/2020 2327  .  prismasol BGK 4/2.5 200 mL/hr at 02/06/2020 2129  . sodium chloride Stopped (01/29/20 0955)  .  sodium chloride    . amiodarone 60 mg/hr (02/08/20 0700)  . electrolyte-A Stopped (02/03/20 5277)  . epinephrine Stopped (02/08/20 0651)  . feeding supplement (PIVOT 1.5 CAL) Stopped (02/06/20 1200)  . fentaNYL infusion INTRAVENOUS 150 mcg/hr (02/08/20 0700)  . impella catheter heparin 50 unit/mL in dextrose 5%    . insulin 2.4 mL/hr at 02/08/20 0700  . lactated ringers 20 mL/hr at 02/16/2020 1500  . meropenem (MERREM) IV Stopped (02/08/20 8242)  . midazolam Stopped (02/08/20 3536)  . milrinone 0.125 mcg/kg/min (02/08/20 0700)  . norepinephrine (LEVOPHED) Adult infusion 48 mcg/min (02/08/20 0700)  . prismasol BGK 4/2.5 1,500 mL/hr at 02/08/20 0642  . vancomycin Stopped (02/09/2020 1326)  . vasopressin 0.04 Units/min (02/08/20 0700)    PRN Medications: sodium chloride, Place/Maintain arterial line **AND** sodium chloride, artificial tears, bisacodyl **OR** bisacodyl, docusate, fentaNYL, fentaNYL (SUBLIMAZE) injection, fentaNYL (SUBLIMAZE) injection, heparin, heparin, metoprolol tartrate, midazolam, midazolam, ondansetron (ZOFRAN) IV, polyethylene glycol, sodium chloride flush     Assessment/Plan   1. Cardiogenic shock/acute on chronic systolic CHF: Initially nonischemic cardiomyopathy. Possible familial cardiomyopathy as both parents had cardiomyopathy and died at around 59.However, [employer] gene testing did not show any common mutation for cardiomyopathy.  However, this admission noted to have severe 3 vessel disease so suspect component of ischemic cardiomyopathy. [employer] ICD. Echo in 8/20 with EF 15% and mildly decreased RV function. Echo this admission with EF < 20%, moderate LV dilation, RV mildly reduced, severe LAE, no significant MR.Low output  HF with markedly low EF.  He has a long history of cardiomyopathy (20 yrs), tends to minimize symptoms.  Cardiorenal syndrome with creatinine up to 2.2,  stabilized on milrinone and Impella 5.5. SCr 1.44 day of CABG. CABG 12/15.  Post-op shock,  back to OR 12/16 to open chest (chest wall compartment syndrome) and again to evacuate hematoma.  TEE post-op with severe RV dysfunction.  CVVHD initiated 12/19 for volume removal in setting of rising Scr/decreased UOP. Suspect ATN. S/p chest closure 12/23. Hemodynamics much worse on 12/24 with rising pressor demands. Co-ox 36%.  Bedside echo severe biventricular dysfunction with marked septal bounce. Chest re-opened 12/24.  Was atrial paced back to NSR on 12/26 with improvement in hemodynamics but back in atrial fibrillation on 12/27 and unable to pace out, DCCV to NSR on 12/28. Went back to OR 1/5 for chest closure but several hours later required emergent re-opening of chest at bedside to evacuate hematoma. To OR again 1/10 with closure of chest with omental flap. On NE 48, VP 0.04, off epinephrine. Milrinone 0.125. Impella at P-8 with position adjusted 1/10 in OR. Flow 4.2. No alarms. Co-ox 72%. CVP 8, remains edematous (3rd spacing).  - Continue slow wean of NE.  - Continue milrinone at 0.125.  - Continue Impella at P-8 while he is on high pressor doses.  - Aim for net UF 50 cc/hr via CVVH today.   2. Atrial fibrillation: H/o PAF.  He was on dofetilide in the remote past but this was stopped due to noncompliance. He had an upper GI bleed from antral ulcers in 3/12. He was seen by GI and was deemed safe to restart anticoagulation as long as he remains on a PPI.  No apparent recurrence of AF until just prior to this admission, was cardioverted back to NSR in ER but went back into atrial fibrillation.  Post-op afib, DCCV to NSR/BiV pacing am 12/16 -> back to AF. Rapid atrial pacing back to SR on 12/26 -> back in AF on 12/27 and unable to pace out. 12/28 DCCV to NSR.  Maintaining NSR.  - Continue IV amiodarone, can decrease to 30 mg/hr.   - Currently off systemic heparin with chest bleeding. Getting heparin through impella purge  - Did not have Maze with CABG.    3. CAD:   Cath this admission with severe  3 vessel disease. CABG x 4 on 12/15.  - Continue atorvastatin, ASA.  - continue current management 4. Acute on chronic hypoxemic respiratory failure: Vent per CCM.  Has tracheostomy.  - CCM following 5. AKI on CKD stage 3: Likely ATN/cardiorenal. Anuric. On CVVH.    - As above, aim for net negative 50 cc/hr via CVVH today. 6. Diabetes: Insulin.  7. Hyponatremia: Resolved with CVVH.  8. Torsades/ICD shock: 12/7/212 in hospital event, in setting of severe hypokalemia and hypomagnesemia.   9. Gout: h/o gout. Complained of rt knee pain c/w previous flares - treated w/ prednisone burst (completed).  10: ID: Treating for PNA.  Afebrile.  Cultures NGTD.   - 1/4 restarted vancomycin and meropenem, per CCM.  11. FEN: Restart TFs via J-tube. 12. Anemia: Stable, hgb 7.9.  Transfuse < 7.5.  13. Stage II Pressure Ulcer- sacrum -Reposition every 2 hours R/L  14. Pleural Effusion  - loculated rt apical pleural effusion noted on imaging.  - continue UF through CVVH  - PCCM following   CRITICAL CARE Performed by: Loralie Champagne  Total critical care time: 40 minutes  Critical  care time was exclusive of separately billable procedures and treating other patients.  Critical care was necessary to treat or prevent imminent or life-threatening deterioration.  Critical care was time spent personally by me on the following activities: development of treatment plan with patient and/or surrogate as well as nursing, discussions with consultants, evaluation of patient's response to treatment, examination of patient, obtaining history from patient or surrogate, ordering and performing treatments and interventions, ordering and review of laboratory studies, ordering and review of radiographic studies, pulse oximetry and re-evaluation of patient's condition.  Loralie Champagne 02/08/2020 8:17 AM

## 2020-02-08 NOTE — Progress Notes (Signed)
0500:Patient began to cough which caused a change in heart rhythm and subsequently poor hemodynamics.  EKG was obtained and Dr. Kipp Brood was called  Verbal orders to increase epi to 23mcg and increase levophed to maintain pressures.  05:10 Dr. Kipp Brood was called back again after no improvements with these orders. New orders to page on call cardiologist to come to bedside   05:20 Dr. Jeannette Corpus at the bedside shortly after patient self converted back to NSR  Verbal order to give 150 mg bolus of amio and then start at 60mg /hr.

## 2020-02-08 NOTE — Progress Notes (Signed)
Received disability forms with signed ROI from [employer], forms completed and signed by Dr Aundra Dubin, faxed to [employer] along with records at [phone]

## 2020-02-08 NOTE — Progress Notes (Signed)
[facility] Progress Note    Assessment/ Plan:    64M with familial cardiomyopathy EF20% s/p redo CABG 70/35/00 complicated by persistent shock, multiple attempts at sternal closure with formation of chest hematoma and tamponade, Afib with RVR, and AKI requiring CRRT.    #AKI/CKD stage III- presumably due to ischemic ATN in setting of cardiogenic shock s/p CABG.  CRRT started 01/16/20. All fluids 4K/2.5Ca.  Right femoral HD catheter placed 01/16/20 switched to [place] on 01/28/20.  - continue CRRT--> Continue goal net negative today.  # CAD s/p CABG complicated by cardiogenic shock and post-op hematoma. secondary closure on 12/23 then had to be re opened.  Went to the OR for washout on 12/28, bedside washout 02/01/19.  OR 02/27/2020 for closure, then had emergent reopening at the bedside 02/25/2020 d/t anterior chest hematoma again. OR for chest closure on 1/10.  # Cardiogenic shock- underlying familial cardiomyopathy s/p redo CABG, EF ~20%. Remains on pressors: levo and vaso. Also on milrinone. Impella P8. Continue mgmt per primary  # ABLA- s/p postoperative bleeding into chest cavity s/p re-exploration for tamponade/hematoma. Received multiple blood products yesterday  # Atrial fibrillation - s/p DCCV on 12/16.  On amiodarone.  Normal sinus rhythm currently  # VDRF- per primary svc. Trach/peg 1/10  #-Leukocytosis/ new fever: cultures redrawn 02/01/20, vanc and meropenem restarted 02/01/20 (had been off since 01/28/20)  # R loculated pleural effusion: for CT chest on 1/6 with contrast which is necessary for clinical decision making.   Subjective:     S/p trach/peg and chest closure 1/10. Had an issue with impella overnight which has resolved. Was on epi gtt but now off. Net neg around 0.4L   Objective:   BP (!) 91/55   Pulse 90   Temp 98.4 F (36.9 C) (Oral)   Resp (!) 28   Ht 6' 3" (1.905 m)   Wt 106 kg   SpO2 98%   BMI 29.21 kg/m   Intake/Output Summary (Last 24  hours) at 02/08/2020 1154 Last data filed at 02/08/2020 1149 Gross per 24 hour  Intake 4952.67 ml  Output 4997 ml  Net -44.33 ml   Weight change: -0.4 kg  Physical Exam: Gen: sedated, comfortable CVS: Impella sounds obscure heart sounds CHEST: woundvac foam in place, 2 chest tubes in place, R [place] Impella Resp: coarse bilateral breath sounds Abd: no distention Ext: edema in ble ACCESS: R IJ nontunneled HD cath  Imaging: DG CHEST PORT 1 VIEW  Result Date: 02/08/2020 CLINICAL DATA:  ETT, chest tube, open sternum EXAM: PORTABLE CHEST 1 VIEW COMPARISON:  Numerous priors, most recent 01/29/2020 FINDINGS: *Tracheostomy tube terminates 5 cm from the carina. *Dual lumen right IJ approach central venous catheter tip terminates at the superior cavoatrial junction. *Right upper extremity approach Impella device remains in stable position. *Left chest wall pacer/AICD with tip at the cardiac apex. *Mediastinal and bilateral pleural drains remain in place. *Pacer pads and telemetry leads overlie the chest. Stable cardiomegaly. Persistent hazy opacities throughout the lungs accounting for differences in technique. Stable bilateral effusions as well as a more lobular opacity towards the right lung apex. No pneumothorax. No new acute osseous or soft tissue abnormalities. IMPRESSION: Lines and tubes as above. Persistent hazy opacities throughout the lungs compatible with edema. Stable bilateral effusions as well as a more indeterminate lobular opacity towards the right lung apex. Electronically Signed   By: Lovena Le M.D.   On: 02/08/2020 06:56   DG Chest [facility] 1 View  Result  Date: 02/27/2020 CLINICAL DATA:  50 year old male with tracheostomy. EXAM: PORTABLE CHEST 1 VIEW COMPARISON:  Chest radiograph dated 02/12/2020. FINDINGS: Interval removal of the endotracheal tube and placement of a tracheostomy with tip approximately 5.2 cm above the carina. There has been interval removal of feeding tube. The remainder  of the support apparatus in similar position. No significant interval change in cardiomegaly and bilateral interstitial prominence and edema. Right upper lobe extrapleural loculated effusion or hematoma similar or slightly increased. No pneumothorax. IMPRESSION: 1. Interval placement of a tracheostomy with tip above the carina. 2. Slight interval increase in the size of the right upper lobe loculated pleural collection. No other significant interval change in the appearance of the lungs. Electronically Signed   By: Anner Crete M.D.   On: 02/05/2020 19:02   DG CHEST PORT 1 VIEW  Result Date: 02/26/2020 CLINICAL DATA:  Post CABG, history of CHF, CAD EXAM: PORTABLE CHEST 1 VIEW COMPARISON:  Numerous priors, most recent 02/06/2020 FINDINGS: Endotracheal tube tip terminates 5.8 cm from the carina. Transesophageal tube tip terminates below the margins of imaging, beyond the GE junction. Impella device remains in place within the left ventricle. Dual lumen right IJ catheter tip terminates at the superior cavoatrial junction. Bilateral pleural and mediastinal drains remain in place. Pacer/defibrillator battery pack overlies the left chest wall with lead at the cardiac apex. Persistent hazy opacities throughout the right lung and minimally within the left lung base likely reflecting some asymmetric pulmonary edema with small persistent effusions. More lobular opacity along the right lung apex. No pneumothorax. Stable cardiomediastinal contours. No acute osseous or soft tissue abnormality. IMPRESSION: No significant interval change with persistent cardiomegaly and findings of pulmonary edema, bilateral effusions, and a lobular attenuation in the right lung apex which may reflect a loculated effusion or extrapleural hematoma. Lines and tubes remain in stable, satisfactory positioning as above. Electronically Signed   By: Lovena Le M.D.   On: 02/09/2020 06:00    Labs: BMET Recent Labs  Lab 02/05/20 0524  02/05/20 1604 02/06/20 0448 02/06/20 1640 02/11/2020 0234 02/19/2020 1549 01/30/2020 1553 02/23/2020 1723 01/31/2020 2050 02/16/2020 2058 02/08/20 0101 02/08/20 0355 02/08/20 0400  NA 135 134* 133* 134* 134*   < > 134* 133* 134* 131* 134* 134* 133*  K 4.3 4.2 3.9 3.8 3.9   < > 4.3 4.1 4.6 4.5 4.6 4.7 4.7  CL 101 103 103 102 102  --  99 100  --  101  --   --  102  CO2 _0 --   --   --   --  22  --   --  23  GLUCOSE 193* 196* 153* 166* 152*  --  160* 208*  --  213*  --   --  137*  BUN 29* 28* 24* 24* 23*  --  20 22*  --  23*  --   --  21*  CREATININE 1.55* 1.45* 1.47* 1.53* 1.43*  --  1.50* 1.50*  --  1.62*  --   --  1.50*  CALCIUM 7.9* 8.0* 8.0* 7.9* 8.1*  --   --   --   --  7.4*  --   --  7.8*  PHOS 3.5 2.8 2.0* 1.7* 1.8*  --   --   --   --  4.5  --   --  3.7   < > = values in this interval not displayed.   CBC Recent Labs  Lab 02/05/20 0524  02/05/20 1707 02/06/20 0448 02/06/20 1402 02/21/2020 0234 02/21/2020 1549 02/01/2020 2058 02/08/20 0101 02/08/20 0355 02/08/20 0400  WBC 16.2*  --  11.5* 11.8* 10.1  --  21.5*  --   --  16.1*  NEUTROABS 12.3*  --  9.1*  --  7.6  --   --   --   --  12.9*  HGB 7.8*   < > 7.7* 7.7* 7.9*   < > 7.1* 7.8* 8.2* 7.9*  HCT 23.6*   < > 23.3* 24.2* 24.0*   < > 22.8* 23.0* 24.0* 25.0*  MCV 91.5  --  90.7 92.0 91.3  --  91.2  --   --  91.2  PLT 190  --  136* 142* 151  --  274  --   --  229   < > = values in this interval not displayed.    Medications:    . vitamin C  500 mg Per Tube TID  . aspirin  324 mg Per Tube Daily  . atorvastatin  80 mg Per Tube Daily  . busPIRone  10 mg Per Tube TID  . chlorhexidine gluconate (MEDLINE KIT)  15 mL Mouth Rinse BID  . Chlorhexidine Gluconate Cloth  6 each Topical Daily  . clonazepam  0.25 mg Per Tube BID  . darbepoetin (ARANESP) injection - NON-DIALYSIS  100 mcg Subcutaneous Q Sat-1800  . docusate  100 mg Per Tube BID  . feeding supplement (PROSource TF)  45 mL Per Tube TID  . fiber  1 packet Per Tube  BID  . FLUoxetine  40 mg Per Tube BID  . lidocaine  1 patch Transdermal Q24H  . mouth rinse  15 mL Mouth Rinse 10 times per day  . melatonin  5 mg Per Tube Daily  . midodrine  15 mg Per Tube TID WC  . multivitamin  1 tablet Per Tube QHS  . oxyCODONE  10 mg Per Tube Q6H  . pantoprazole sodium  40 mg Per Tube Daily  . polyethylene glycol  17 g Per Tube Daily  . sodium chloride flush  10-40 mL Intracatheter Q12H  . thiamine  100 mg Per Tube Daily  . valproic acid  250 mg Per Tube BID      Gean Quint  02/08/2020, 11:54 AM

## 2020-02-08 NOTE — Progress Notes (Signed)
NAME:  Corey Palmer, MRN:  [id number], DOB:  August 08, 1970, LOS: 19 ADMISSION DATE:  01/15/2020, CONSULTATION DATE: 01/02/2020 REFERRING MD: Aundra Dubin, CHIEF COMPLAINT: Respiratory failure post Impella  HPI/course in hospital  50 year old man admitted with A. fib/RVR, torsades, cardiogenic shock [EF 15] requiring Impella, CABG x5, AKI requiring initiation of CRRT  Past Medical History:    has a past medical history of Anginal pain ([place]), Anxiety, Arthritis, Automatic implantable cardioverter-defibrillator in situ (2007), Bipolar disorder ([place]), CAD (coronary artery disease), Cancer ([place]) (2013), CHF (congestive heart failure) ([place]), Chondrocalcinosis of right knee (3/66/4403), Chronic systolic heart failure ([place]), Depression, Diabetes uncomplicated adult-type II, Dilated cardiomyopathy ([place]), Dyslipidemia, Dysrhythmia, GERD (gastroesophageal reflux disease), Gout, unspecified, Hepatic steatosis (2011), History of kidney stones, echocardiogram, Hypertension, Obesity (BMI 30.0-34.9), Pacemaker, Paroxysmal atrial fibrillation ([place]), Peptic ulcer, Shortness of breath, and Sleep apnea.  Significant Hospital Events:  12/2 Afib with RVR. Cardioverted in ED. AICD did not fire. Milrinone for low co ox.  12/7 ICD fired, Torsades- likely from low K of 2.8.  12/8 Cath- multivessel disease w/ low CO. Volume overloaded.  12/11 Underwent Impella 5.5 insertion for persistent symptoms of forward failure with low cardiac indices. 12/12 Back to the OR for displacement of Impella. Extubated.  12/15 CABG x4, with LIMA-LAD, SVG-PDA/PLV, SVG-OM 12/16 Emergently opened chest at bedside for tamponade, clot compressing right atrium  12/16 To OR for Exploration of chest due to bleeding  12/19 Started CRRT 12/23 Sternal Closure in OR 12/24 Worsening shock.  Chest reopened at bedside with improvement in hemodynamics.  No mediastinal hematoma. 12/25 A flutter > rapid a paced to sinus rhythm.  Remains on high-dose pressors,  inotropes 12/28 Weaning vasopressor requirements.  For chest washout today.  Back in atrial fibrillation, refractory to increased amiodarone and attempted overdrive pacing.  DCCV to NSR. Tolerating fluid removal via CRRT. 1/3: Appears euvolemic.  Adjusting glycemic control.  Chest still open so not weaning 1/4, spiking fever, Vanco meropenem resumed after culture sent from blood and sputum. 1/5 awaiting return to the OR for wound VAC dressing assessment fever curve and white blood cell count improving after antibiotics resumed the day prior, did have chest closed, developed tamponade physiology shortly after had to go back for emergent reopening of chest, return to intensive care and refractory shock 1/6: Wound VAC dressing replaced.  Still in shock.  Starting to wean sedation 1/7: Still on high-dose pressors  Consults:  PCCM, nephrology, cardiology, cardiothoracic surgery  Procedures:  12/2 R PICC >> 12/11 right ax impella 5.5 >> 12/15 ETT >> 12/15 right femoral arterial sheath >> 12/19 right femoral HD >> 12/23 chest tube-right pleural, left pleural , mediastinal  >> 1/10  Trach->1/11 1/10 PEG->1/11  Significant Diagnostic Tests:    Micro Data:  Blood culture 12/14 > negative  Blood culture 12/16 > negative Respiratory culture 12/20 > rare candida albicans Sternal wound culture 12/23 > no growth Fungal cx sternal wound 12/23 >> neg Fungal cx w/stain sternal wound 12/23 >> negative AFB sternal wound 12/23 >> negative 1/4 blood culture x2>>> neg 1/4 respiratory culture>>> rare candida albicans Sputum 1/9 >   Antimicrobials:  Vancomycin 12/16 >>12/31 Meropenem 12/16 >> 12/31 Vancomycin and meropenem resumed on 1/4  Interval history:  1/11: arousable follows commands. Tachycardic and tachypneic this am. On vent. S/p trach/ j and g tube yesterday. Feeding thru j tube. Omental flap closure of sterum with wound vanc application. Remains on crrt on some vasopressor support as  well.   Objective  Blood pressure (!) 91/55, pulse 90, temperature 98.4 F (36.9 C), temperature source Oral, resp. rate (!) 28, height 6\' 3"  (1.905 m), weight 106 kg, SpO2 98 %. CVP:  [4 mmHg-19 mmHg] 8 mmHg  Vent Mode: PRVC FiO2 (%):  [40 %-100 %] 50 % Set Rate:  [28 bmp] 28 bmp Vt Set:  [510 mL] 510 mL PEEP:  [5 cmH20] 5 cmH20 Plateau Pressure:  [24 cmH20-26 cmH20] 26 cmH20   Intake/Output Summary (Last 24 hours) at 02/08/2020 1128 Last data filed at 02/08/2020 1100 Gross per 24 hour  Intake 4892.67 ml  Output 4997 ml  Net -104.33 ml   Filed Weights   02/06/20 0500 02/27/2020 0500 02/08/20 0615  Weight: 107.5 kg 106.4 kg 106 kg   CVP:  [4 mmHg-19 mmHg] 8 mmHg  Examination: General: middle aged man laying in bed sedated but arousable HEENT: [place]/AT, eyes anicteric, oral mucosa moist. trach, NGT Pulmonary: Rhonchi bilaterally Cardiac: Impella P8 R axillary. Wound vac w/ open chest. Abdomen: soft, NT, ND Extremities:pitting edema in all extremities Neuro: RASS -1, tracks with his eyes, squeeze hand to command Derm: pallor, no rashes   CXR> persistent R sided loculated effusions but mild improvement in R upper lobe infiltrate/edema,  personally reviewed by me.   Assessment & Plan:   Acute hypoxic and hypercapnic respiratory failure requiring mechanical ventilation following cardiac surgery. Persistent right sided loculated pleural effusions- apically and in the base HAP- organism unknown -Con't LTVV, 4-8cc/kg IBW with goal Pplat<30 and DP<15 -s/p trach will attempt to wean sedation for ps trials and sbt, increase oral pain meds to facilitate this.  -CT to evaluate apical fluid collection has been deferred due to instability. Eventually needs evaluation. -PAD protocol for sedation -VAP prevention protocol. -Wean FiO2 as able to maintain SPO2 greater than 90%.  Cardiogenic shock s/p high risk CABG. Progressive shock on 1/5 secondary to obstructive physiology from hematoma  after attempted chest closure necessitating emergent reopening of chest with reevacuation Post cardiac surgery RV dysfunction CAD s/p CABG, c/b cardiac tamponade, hematoma evac.  Open chest with VAC.   -Continue Impella P8 w/ heparin purge. LDH climbing raising concern for increasing hemolysis. Con't to monitor. -Continue vasopressor and inotropic support -Continue amiodarone infusion -Continue aspirin and statin. -Needs significant volume removal; making progress tolerating UF. -increasing oral to wean or stop continuous IV  Sepsis/septic shock, likely HAP -con't vanc, meropenem; deescalate when able -Repeat trach aspirate culture pending  Paroxysmal Afib; Currently normal sinus -Continuing IV heparin purge with impella. Systemic drip on hold due to mediastinal bleeding. -Continuing IV amiodarone -Continuing telemetry monitoring  Acute kidney injury with anuria; likely cardiorenal syndrome -Con't CVVHD per nephrology.  Hypophosphatemia. -Kphos  DM2 with hyperglycemia -insulin gtt  Prolonged qt:  -stop seroquel -goal mag >2 and K >4 -monitor intermittently   Acute blood loss anemia, anemia of critical illness Acute thrombocytopenia, likely due to acute illness, possibly due to consumption from impella -Continue to trend daily CBC -transfusions per TCTS -platelet transfusion if <100K with major bleeding  Deconditioning -s/p trach -will need aggressive rehabilitation after prolonged critical illness  Daily Goals Checklist  Pain/Anxiety/Delirium protocol (if indicated): PAD protocol VAP protocol (if indicated): bundle in place. DVT prophylaxis:  On hold 2/2 bleeding Nutrition Status: via j/g tube  GI prophylaxis: pantoprazole Glucose control: insulin gtt Mobility/therapy needs: bed rest  Code Status: full  Family Communication: son Disposition: ICU Critical care time: The patient is critically ill with multiple organ systems failure and requires high complexity  decision making for assessment and support, frequent evaluation and titration of therapies, application of advanced monitoring technologies and extensive interpretation of multiple databases.  Critical care time 38 mins. This represents my time independent of the NPs time taking care of the pt. This is excluding procedures.    [facility] 02/08/2020, 11:30 AM

## 2020-02-08 NOTE — Progress Notes (Signed)
Brief Note:  Called to bedside by RN for rhythm change and transient loss of pulsatility on the Impella.  The nurse reports that the patient began to cough and then seems to have had a vasovagal episode. Telemetry showed change in QRS complexes though the HR was still in the 100s.   ECG at 05:06 - sinus tach, RBBB, QTc 600 (worsened hemodynamics)  ECG at 05:18 - sinus tach, LBBB, QTc 712 (spontaneous improvement)  The hemodynamics improved once the rhythm spontaneously reverted back to ST with LBBB    Corey Palmer Laymond Purser, MD, [facility]

## 2020-02-08 NOTE — Progress Notes (Signed)
Nutrition Follow-up  DOCUMENTATION CODES:   Not applicable  INTERVENTION:   Tube Feeding via J-tube:  -Pivot 1.5to37ml/hrvia Cortrak -Pro-Source TF45 mlTID Provides191g of protein, 2640kcals and 1284mL of free water  Continue Rena-Vite daily   NUTRITION DIAGNOSIS:   Inadequate oral intake related to acute illness as evidenced by NPO status.  Being addressed via TF   GOAL:   Patient will meet greater than or equal to 90% of their needs  Being addressed via TF   MONITOR:   Weight trends,Vent status  REASON FOR ASSESSMENT:   Ventilator    ASSESSMENT:   50 yo male admitted with Afib with RVR requiring cardioversion, ischemic CM requiring inotropes and impella support and ultimately underwent CABG x 4 on 12/15 with Impella still in place. PMH includes CAD, benign stomach cancer, CHF, DM, depression, CM, GERD, HTN  12/11 Impella LVAD placed 12/15 Redo Sternotomy, CABG on pump x 4, Intubated 12/16 Bedside chest exploration for tamponade with moderate amount of anterior mediastinal clot removed, OR for bleeding s/p CABG for mediastinal washout 12/17 Cortrak placed-duodenal  12/19 CRRT initiated 12/23 Sternal closure  12/24 Worsening shock, Chest reopened at bedside 12/28 OR-Washout, Wound VAC 01/04 Bedside washout 01/05 OR for sternal washout and closure, reopened later due to compromised hemodynamics due to mediastinal hematoma, wound VAC replaced 01/10 Sternal washout with wound VAC change, greater omental flap closure of sternum, TRACH, Open G-tube, J-tube  Pt remains on CRRT, remains on vent support via trach. Impella remains in place  Pivot 1.5 resumed today at 20 ml/hr via J-tube with plans to titrate to goal rate  Pt with rectal tube in place, Type 7 stool. Pt with orders for scheduled Miralax and Colace in addition to Nutrisource Fiber supplement. Plan to discuss with MD  Net negative 6.5 L; current wt 106 kg; lowest wt 102 kg  Vitamin Panel  (02/03/20): Copper: 114 (wdl) Zinc: 69 (wdl) Vitamin C: 1.3 (wdl) Folate: 7.8 (wdl)   Labs:  reviewed Meds: Vit C 500 mg TID, aranesp, colace, nutrisource fiber, insulin drip, Rena-vit, miralax, thiamine   Diet Order:   Diet Order            Diet NPO time specified  Diet effective midnight                 EDUCATION NEEDS:   Not appropriate for education at this time  Skin:  Skin Assessment: Skin Integrity Issues: Skin Integrity Issues:: Incisions,Other (Comment) Incisions: stermun, chest tubes x 4 Other: venous stasis ulcers  Last BM:  1/10 rectal tube  Height:   Ht Readings from Last 1 Encounters:  01/24/2020 6\' 3"  (1.905 m)    Weight:   Wt Readings from Last 1 Encounters:  02/08/20 106 kg    Ideal Body Weight:  89 kg  BMI:  Body mass index is 29.21 kg/m.  Estimated Nutritional Needs:   Kcal:  2500-2800 kcals  Protein:  180-200 grams  Fluid:  >/= 1.5 L   Kerman Passey MS, RDN, LDN, CNSC Registered Dietitian III Clinical Nutrition RD Pager and On-Call Pager Number Located in [place]

## 2020-02-08 NOTE — Progress Notes (Signed)
Pharmacy Antibiotic Note  Corey Palmer is a 50 y.o. male admitted on 01/10/2020  S/p impella placed then> CABG and Impella remains in placed - with open chest.  Empiric ABX stopped 12/31,then with concerns for possible sepsis and pharmacy re-consulted for vancomycin and meropenem dosing. Afebrile currently, remains on CRRT, WBC remains elevated at 16.1, continued need for pressor support.    Plan: Vancomycin 1g IV every 24 hours  Meropenem 1 gm IV q8 hrs Monitor  cultures, clinical progression  Check vancomycin levels as indicated - continue while chest remains open  Height: 6\' 3"  (190.5 cm) Weight: 106 kg (233 lb 11 oz) IBW/kg (Calculated) : 84.5  Temp (24hrs), Avg:98.6 F (37 C), Min:98.3 F (36.8 C), Max:98.7 F (37.1 C)  Recent Labs  Lab 02/06/20 0448 02/06/20 1402 02/06/20 1640 02/18/2020 0234 02/12/2020 1553 02/14/2020 1723 02/04/2020 2058 02/08/20 0400  WBC 11.5* 11.8*  --  10.1  --   --  21.5* 16.1*  CREATININE 1.47*  --    < > 1.43* 1.50* 1.50* 1.62* 1.50*   < > = values in this interval not displayed.    Estimated Creatinine Clearance: 76.7 mL/min (A) (by C-G formula based on SCr of 1.5 mg/dL (H)).    Allergies  Allergen Reactions  . Orange Fruit Anaphylaxis, Hives and Other (See Comments)    Per Pt- Blisters around lips and Hives all over, also  . Penicillins Anaphylaxis    Did it involve swelling of the face/tongue/throat, SOB, or low BP? Yes Did it involve sudden or severe rash/hives, skin peeling, or any reaction on the inside of your mouth or nose? Yes Did you need to seek medical attention at a hospital or doctor's office? No When did it last happen?childhood If all above answers are "NO", may proceed with cephalosporin use.  [name] [Empagliflozin] Itching  . [name] [Insulin Glargine] Nausea And Vomiting    Antimicrobials this admission: Vancomycin 12/15 >> 12/31 resume 1/4  Meropenem 12/16 >> 12/31 resume 1/4  Dose adjustments this  admission: 12/18: VT 26 - decrease to 750 mg IV q12 hrs 12/19: starting CRRT - adjust Vanc to 1500 mg IV q24 hrs 12/21 VT 27 - decrease Vanc to 1250 q24hr 12/25 VT 31 - hold vancomycin for 12/26 12/27 VT 21 (after holding doses) - restart vanc 1g IV q24hr 12/27 VT 20 - continue  Microbiology results:  12/14 Bcx neg 12/16 Bcx neg MRSA PCR neg  12/20 TA rare yeast  12/23 sternal wound neg 1/4 Bcx (could only get 1) 1/4 TA - rare candida albicans 1/9 TA - rare yeast  [name], Vena [place], BCPS, BCCP Clinical Pharmacist  02/08/2020 1:22 PM   [facility] pharmacy phone numbers are listed on [email or web address]

## 2020-02-08 NOTE — Progress Notes (Signed)
Corey Palmer for Heparin Indication: Impella 5.5  Allergies  Allergen Reactions  . Orange Fruit Anaphylaxis, Hives and Other (See Comments)    Per Pt- Blisters around lips and Hives all over, also  . Penicillins Anaphylaxis    Did it involve swelling of the face/tongue/throat, SOB, or low BP? Yes Did it involve sudden or severe rash/hives, skin peeling, or any reaction on the inside of your mouth or nose? Yes Did you need to seek medical attention at a hospital or doctor's office? No When did it last happen?childhood If all above answers are "NO", may proceed with cephalosporin use.  Corey Palmer [Empagliflozin] Itching  . [name] [Insulin Glargine] Nausea And Vomiting    Patient Measurements: Height: 6\' 3"  (190.5 cm) Weight: 106 kg (233 lb 11 oz) IBW/kg (Calculated) : 84.5 Heparin dosing wt: 101 kg  Vital Signs: Temp: 98.4 F (36.9 C) (01/11 0800) Temp Source: Oral (01/11 0800) BP: 95/72 (01/11 1300) Pulse Rate: 86 (01/11 1300)  Labs: Recent Labs    02/06/20 0448 02/06/20 1402 02/15/2020 0234 02/26/2020 1549 02/08/2020 1723 02/14/2020 2050 02/21/2020 2058 02/08/20 0101 02/08/20 0355 02/08/20 0400  HGB 7.7*   < > 7.9*   < > 8.8*   < > 7.1* 7.8* 8.2* 7.9*  HCT 23.3*   < > 24.0*   < > 26.0*   < > 22.8* 23.0* 24.0* 25.0*  PLT 136*   < > 151  --   --   --  274  --   --  229  LABPROT  --   --   --   --   --   --  15.9*  --   --   --   INR  --   --   --   --   --   --  1.3*  --   --   --   HEPARINUNFRC <0.10*  --  <0.10*  --   --   --   --   --   --  <0.10*  CREATININE 1.47*   < > 1.43*   < > 1.50*  --  1.62*  --   --  1.50*   < > = values in this interval not displayed.    Estimated Creatinine Clearance: 76.7 mL/min (A) (by C-G formula based on SCr of 1.5 mg/dL (H)).   Assessment: 50 yo male s/p impella 5.5 placement. Pt started on heparinized purge and systemic heparin. Pt now s/p CABG 12/15 with Impella remaining in place c/b  significant bleeding and hematoma requiring opened chest and reexploration in OR 12/16. Chest closed on 12/23, re-opened on 12/24. Underwent cardioversion in OR 12/28, s/p sternal wash out/application of wound VAC.   Underwent sternal washout, wound VAC change, followed by sternal closure on 1/5. Chest was re-opened in evening given compromised hemodynamics.   Impella on P8 with stable purge pressures (400-500), purge flow rate 8.7 ml/hr (only on heparin in purge solution for now). Hgb 7.9, plt 229, LDH 316 > 408 > 392.  Heparin level remains undetectable as expected.  Goal of Therapy:  Monitor platelets by anticoagulation protocol: Yes   Plan:  Getting heparin (435 units/hr) in purge solution. Not adding systemic heparin for now. F/u daily CBC, heparin level.  Corey Palmer, BCCP Clinical Pharmacist  02/08/2020 1:15 PM   [facility] pharmacy phone numbers are listed on [email or web address]

## 2020-02-09 DIAGNOSIS — I472 Ventricular tachycardia: Secondary | ICD-10-CM | POA: Diagnosis not present

## 2020-02-09 DIAGNOSIS — R570 Cardiogenic shock: Secondary | ICD-10-CM | POA: Diagnosis not present

## 2020-02-09 DIAGNOSIS — S20219A Contusion of unspecified front wall of thorax, initial encounter: Secondary | ICD-10-CM

## 2020-02-09 DIAGNOSIS — J9601 Acute respiratory failure with hypoxia: Secondary | ICD-10-CM | POA: Diagnosis not present

## 2020-02-09 LAB — RENAL FUNCTION PANEL
Albumin: 1.4 g/dL — ABNORMAL LOW (ref 3.5–5.0)
Albumin: 1.7 g/dL — ABNORMAL LOW (ref 3.5–5.0)
Anion gap: 9 (ref 5–15)
Anion gap: 8 (ref 5–15)
BUN: 20 mg/dL (ref 6–20)
BUN: 23 mg/dL — ABNORMAL HIGH (ref 6–20)
CO2: 22 mmol/L (ref 22–32)
CO2: 24 mmol/L (ref 22–32)
Calcium: 6.8 mg/dL — ABNORMAL LOW (ref 8.9–10.3)
Calcium: 7.9 mg/dL — ABNORMAL LOW (ref 8.9–10.3)
Chloride: 104 mmol/L (ref 98–111)
Chloride: 103 mmol/L (ref 98–111)
Creatinine, Ser: 1.2 mg/dL (ref 0.61–1.24)
Creatinine, Ser: 1.38 mg/dL — ABNORMAL HIGH (ref 0.61–1.24)
GFR, Estimated: >60 mL/min (ref >60)
GFR, Estimated: >60 mL/min (ref >60)
Glucose, Bld: 238 mg/dL — ABNORMAL HIGH (ref 70–99)
Glucose, Bld: 181 mg/dL — ABNORMAL HIGH (ref 70–99)
Phosphorus: 2.7 mg/dL (ref 2.5–4.6)
Phosphorus: 2.5 mg/dL (ref 2.5–4.6)
Potassium: 3.6 mmol/L (ref 3.5–5.1)
Potassium: 4.3 mmol/L (ref 3.5–5.1)
Sodium: 135 mmol/L (ref 135–145)
Sodium: 135 mmol/L (ref 135–145)

## 2020-02-09 LAB — BPAM RBC
Blood Product Expiration Date: 202201272359
Blood Product Expiration Date: 202201272359
Blood Product Expiration Date: 202201282359
Blood Product Expiration Date: 202201292359
Blood Product Expiration Date: 202201302359
ISSUE DATE / TIME: 202201080842
ISSUE DATE / TIME: 202201101603
ISSUE DATE / TIME: 202201102244
ISSUE DATE / TIME: 202201111840
ISSUE DATE / TIME: 202201112028
Unit Type and Rh: 6200
Unit Type and Rh: 6200
Unit Type and Rh: 6200
Unit Type and Rh: 6200
Unit Type and Rh: 6200

## 2020-02-09 LAB — POCT I-STAT 7, (LYTES, BLD GAS, ICA,H+H)
Acid-Base Excess: 1 mmol/L (ref 0.0–2.0)
Bicarbonate: 25.2 mmol/L (ref 20.0–28.0)
Calcium, Ion: 1.16 mmol/L (ref 1.15–1.40)
HCT: 29 % — ABNORMAL LOW (ref 39.0–52.0)
Hemoglobin: 9.9 g/dL — ABNORMAL LOW (ref 13.0–17.0)
O2 Saturation: 96 %
Potassium: 4.3 mmol/L (ref 3.5–5.1)
Sodium: 137 mmol/L (ref 135–145)
TCO2: 26 mmol/L (ref 22–32)
pCO2 arterial: 39.1 mmHg (ref 32.0–48.0)
pH, Arterial: 7.417 (ref 7.350–7.450)
pO2, Arterial: 78 mmHg — ABNORMAL LOW (ref 83.0–108.0)

## 2020-02-09 LAB — PREPARE RBC (CROSSMATCH)

## 2020-02-09 LAB — TYPE AND SCREEN
ABO/RH(D): A POS
Antibody Screen: NEGATIVE
Unit division: 0
Unit division: 0
Unit division: 0
Unit division: 0
Unit division: 0

## 2020-02-09 LAB — CBC
HCT: 30.1 % — ABNORMAL LOW (ref 39.0–52.0)
Hemoglobin: 9.7 g/dL — ABNORMAL LOW (ref 13.0–17.0)
MCH: 28.4 pg (ref 26.0–34.0)
MCHC: 32.2 g/dL (ref 30.0–36.0)
MCV: 88 fL (ref 80.0–100.0)
Platelets: 278 10*3/uL (ref 150–400)
RBC: 3.42 MIL/uL — ABNORMAL LOW (ref 4.22–5.81)
RDW: 19.4 % — ABNORMAL HIGH (ref 11.5–15.5)
WBC: 17.2 10*3/uL — ABNORMAL HIGH (ref 4.0–10.5)
nRBC: 0.1 % (ref 0.0–0.2)

## 2020-02-09 LAB — GLUCOSE, CAPILLARY
Glucose-Capillary: 146 mg/dL — ABNORMAL HIGH (ref 70–99)
Glucose-Capillary: 153 mg/dL — ABNORMAL HIGH (ref 70–99)
Glucose-Capillary: 164 mg/dL — ABNORMAL HIGH (ref 70–99)
Glucose-Capillary: 166 mg/dL — ABNORMAL HIGH (ref 70–99)
Glucose-Capillary: 168 mg/dL — ABNORMAL HIGH (ref 70–99)
Glucose-Capillary: 168 mg/dL — ABNORMAL HIGH (ref 70–99)
Glucose-Capillary: 169 mg/dL — ABNORMAL HIGH (ref 70–99)
Glucose-Capillary: 170 mg/dL — ABNORMAL HIGH (ref 70–99)
Glucose-Capillary: 173 mg/dL — ABNORMAL HIGH (ref 70–99)
Glucose-Capillary: 174 mg/dL — ABNORMAL HIGH (ref 70–99)
Glucose-Capillary: 176 mg/dL — ABNORMAL HIGH (ref 70–99)
Glucose-Capillary: 177 mg/dL — ABNORMAL HIGH (ref 70–99)
Glucose-Capillary: 179 mg/dL — ABNORMAL HIGH (ref 70–99)
Glucose-Capillary: 184 mg/dL — ABNORMAL HIGH (ref 70–99)
Glucose-Capillary: 192 mg/dL — ABNORMAL HIGH (ref 70–99)
Glucose-Capillary: 195 mg/dL — ABNORMAL HIGH (ref 70–99)
Glucose-Capillary: 198 mg/dL — ABNORMAL HIGH (ref 70–99)
Glucose-Capillary: 199 mg/dL — ABNORMAL HIGH (ref 70–99)
Glucose-Capillary: 220 mg/dL — ABNORMAL HIGH (ref 70–99)

## 2020-02-09 LAB — CBC WITH DIFFERENTIAL/PLATELET
Abs Immature Granulocytes: 0.2 10*3/uL — ABNORMAL HIGH (ref 0.00–0.07)
Basophils Absolute: 0.1 10*3/uL (ref 0.0–0.1)
Basophils Relative: 1 %
Eosinophils Absolute: 0.7 10*3/uL — ABNORMAL HIGH (ref 0.0–0.5)
Eosinophils Relative: 5 %
HCT: 23.5 % — ABNORMAL LOW (ref 39.0–52.0)
Hemoglobin: 7.8 g/dL — ABNORMAL LOW (ref 13.0–17.0)
Immature Granulocytes: 1 %
Lymphocytes Relative: 6 %
Lymphs Abs: 0.9 10*3/uL (ref 0.7–4.0)
MCH: 29.4 pg (ref 26.0–34.0)
MCHC: 33.2 g/dL (ref 30.0–36.0)
MCV: 88.7 fL (ref 80.0–100.0)
Monocytes Absolute: 1.7 10*3/uL — ABNORMAL HIGH (ref 0.1–1.0)
Monocytes Relative: 12 %
Neutro Abs: 10.9 10*3/uL — ABNORMAL HIGH (ref 1.7–7.7)
Neutrophils Relative %: 75 %
Platelets: 237 10*3/uL (ref 150–400)
RBC: 2.65 MIL/uL — ABNORMAL LOW (ref 4.22–5.81)
RDW: 19 % — ABNORMAL HIGH (ref 11.5–15.5)
WBC: 14.5 10*3/uL — ABNORMAL HIGH (ref 4.0–10.5)
nRBC: 0.1 % (ref 0.0–0.2)

## 2020-02-09 LAB — CULTURE, RESPIRATORY W GRAM STAIN

## 2020-02-09 LAB — LACTATE DEHYDROGENASE: LDH: 383 U/L — ABNORMAL HIGH (ref 98–192)

## 2020-02-09 LAB — COOXEMETRY PANEL
Carboxyhemoglobin: 1.8 % — ABNORMAL HIGH (ref 0.5–1.5)
Methemoglobin: 0.6 % (ref 0.0–1.5)
O2 Saturation: 78.3 %
Total hemoglobin: 9.6 g/dL — ABNORMAL LOW (ref 12.0–16.0)

## 2020-02-09 LAB — HEPARIN LEVEL (UNFRACTIONATED): Heparin Unfractionated: <0.1 IU/mL — ABNORMAL LOW (ref 0.30–0.70)

## 2020-02-09 LAB — MAGNESIUM: Magnesium: 1.9 mg/dL (ref 1.7–2.4)

## 2020-02-09 MED ORDER — MIDAZOLAM HCL 2 MG/2ML IJ SOLN
INTRAMUSCULAR | Status: AC
  Administered 2020-02-09: 2 mg via INTRAVENOUS
  Filled 2020-02-09: qty 2

## 2020-02-09 MED ORDER — POTASSIUM CHLORIDE 10 MEQ/50ML IV SOLN
10.0000 meq | INTRAVENOUS | Status: AC
  Administered 2020-02-09 (×3): 10 meq via INTRAVENOUS
  Filled 2020-02-09 (×3): qty 50

## 2020-02-09 MED ORDER — MAGNESIUM SULFATE 2 GM/50ML IV SOLN
2.0000 g | Freq: Once | INTRAVENOUS | Status: AC
  Administered 2020-02-09: 2 g via INTRAVENOUS
  Filled 2020-02-09: qty 50

## 2020-02-09 MED ORDER — SODIUM CHLORIDE 0.9 % IV SOLN
0.5000 mg/h | INTRAVENOUS | Status: DC
  Administered 2020-02-09: 1 mg/h via INTRAVENOUS
  Administered 2020-02-10: 2 mg/h via INTRAVENOUS
  Administered 2020-02-11 – 2020-02-14 (×8): 4 mg/h via INTRAVENOUS
  Administered 2020-02-15: 20:00:00 3 mg/h via INTRAVENOUS
  Administered 2020-02-15 – 2020-02-18 (×6): 4 mg/h via INTRAVENOUS
  Administered 2020-02-20 – 2020-02-28 (×10): 2 mg/h via INTRAVENOUS
  Filled 2020-02-09 (×41): qty 5

## 2020-02-09 MED ORDER — AMIODARONE LOAD VIA INFUSION
150.0000 mg | Freq: Once | INTRAVENOUS | Status: AC
  Administered 2020-02-09: 150 mg via INTRAVENOUS

## 2020-02-09 MED ORDER — PANTOPRAZOLE SODIUM 40 MG IV SOLR
40.0000 mg | Freq: Two times a day (BID) | INTRAVENOUS | Status: DC
  Administered 2020-02-09 – 2020-03-07 (×56): 40 mg via INTRAVENOUS
  Filled 2020-02-09 (×56): qty 40

## 2020-02-09 MED ORDER — ALBUMIN HUMAN 25 % IV SOLN
12.5000 g | Freq: Once | INTRAVENOUS | Status: AC
  Administered 2020-02-09: 12.5 g via INTRAVENOUS
  Filled 2020-02-09: qty 50

## 2020-02-09 MED ORDER — HYDROMORPHONE BOLUS VIA INFUSION
0.5000 mg | INTRAVENOUS | Status: DC | PRN
  Administered 2020-02-09 – 2020-02-10 (×2): 0.5 mg via INTRAVENOUS
  Filled 2020-02-09: qty 1

## 2020-02-09 MED ORDER — SODIUM CHLORIDE 0.9% IV SOLUTION: Freq: Once | INTRAVENOUS | Status: AC

## 2020-02-09 NOTE — Progress Notes (Signed)
1500: Patient began to have change in heart rhythm and subsequently poor hemodynamics. 1502: Dr [name] was paged and came to the bedside. An EKG was done. Verbal orders to give 2mg  midazolam and 2g Magnesium.   1510: EP came to bedside verbal orders for 150mg  amio bolus and start 60mg /hr.  Kathleene Hazel RN

## 2020-02-09 NOTE — Progress Notes (Signed)
[facility] for Heparin Indication: Impella 5.5  Allergies  Allergen Reactions  . Orange Fruit Anaphylaxis, Hives and Other (See Comments)    Per Pt- Blisters around lips and Hives all over, also  . Penicillins Anaphylaxis    Did it involve swelling of the face/tongue/throat, SOB, or low BP? Yes Did it involve sudden or severe rash/hives, skin peeling, or any reaction on the inside of your mouth or nose? Yes Did you need to seek medical attention at a hospital or doctor's office? No When did it last happen?childhood If all above answers are "NO", may proceed with cephalosporin use.  [name] [Empagliflozin] Itching  . Basaglar Kwikpen [Insulin Glargine] Nausea And Vomiting    Patient Measurements: Height: 6\' 3"  (190.5 cm) Weight: 103.8 kg (228 lb 13.4 oz) IBW/kg (Calculated) : 84.5 Heparin dosing wt: 101 kg  Vital Signs: Temp: 98.9 F (37.2 C) (01/12 0800) Temp Source: Axillary (01/12 0800) BP: 107/76 (01/12 0900) Pulse Rate: 85 (01/12 0900)  Labs: Recent Labs    02/20/2020 0234 02/05/2020 1549 02/19/2020 2058 02/08/20 0101 02/08/20 0400 02/08/20 1459 02/08/20 1502 02/09/20 0430  HGB 7.9*   < > 7.1*   < > 7.9* 7.5* 7.3* 7.8*  HCT 24.0*   < > 22.8*   < > 25.0* 22.0* 22.0* 23.5*  PLT 151  --  274  --  229  --  246 237  LABPROT  --   --  15.9*  --   --   --   --   --   INR  --   --  1.3*  --   --   --   --   --   HEPARINUNFRC <0.10*  --   --   --  <0.10*  --   --  <0.10*  CREATININE 1.43*   < > 1.62*  --  1.50*  --  1.45* 1.20   < > = values in this interval not displayed.    Estimated Creatinine Clearance: 95 mL/min (by C-G formula based on SCr of 1.2 mg/dL).   Assessment: 50 yo male s/p impella 5.5 placement. Pt started on heparinized purge and systemic heparin. Pt now s/p CABG 12/15 with Impella remaining in place c/b significant bleeding and hematoma requiring opened chest and reexploration in OR 12/16. Chest closed on 12/23,  re-opened on 12/24. Underwent cardioversion in OR 12/28, s/p sternal wash out/application of wound VAC.   Underwent sternal washout, wound VAC change, followed by sternal closure on 1/5. Chest was re-opened in evening given compromised hemodynamics.   Impella on P8 with stable purge pressures (400-500), purge flow rate 8.5 ml/hr (only on heparin in purge solution for now). Hgb 7.8, plt 237 (fairly stable), LDH 316 > 408 > 383.  Heparin level remains undetectable as expected.  Goal of Therapy:  Monitor platelets by anticoagulation protocol: Yes   Plan:  Getting heparin (435 units/hr) in purge solution. Not adding systemic heparin for now. F/u daily CBC, heparin level.  [name], [name] Reason, [facility] Clinical Pharmacist  02/09/2020 9:24 AM   [facility] pharmacy phone numbers are listed on [email or web address]

## 2020-02-09 NOTE — Progress Notes (Signed)
2 Days Post-Op Procedure(s) (LRB): STERNAL WASHOUT WITH WOUND VAC CHANGE (N/A) GREATER OMENTAL FLAP CLOSURE OF STERNUM (N/A) OPEN TRACHEOSTOMY USING #8 SHILEY (N/A) OPEN INSERTION OF 16 FR. BOLUS GASTROSTOMY TUBE (N/A) 16 FR JEJUNOSTOMY INSERTION  (N/A) TRANSESOPHAGEAL ECHOCARDIOGRAM (TEE) (N/A) Subjective: sedated  Objective: Vital signs in last 24 hours: Temp:  [98.4 F (36.9 C)-98.9 F (37.2 C)] 98.9 F (37.2 C) (01/12 1308) Pulse Rate:  [54-87] 82 (01/12 1517) Cardiac Rhythm: Normal sinus rhythm (01/12 1200) Resp:  [24-28] 28 (01/12 1517) BP: (93-116)/(55-87) 104/57 (01/12 1517) SpO2:  [95 %-100 %] 99 % (01/12 1517) Arterial Line BP: (77-127)/(49-72) 106/57 (01/12 1500) FiO2 (%):  [50 %] 50 % (01/12 1517) Weight:  [103.8 kg] 103.8 kg (01/12 0500)  Hemodynamic parameters for last 24 hours: CVP:  [6 mmHg-10 mmHg] 9 mmHg  Intake/Output from previous day: 01/11 0701 - 01/12 0700 In: 4412.3 [I.V.:2813.6; NG/GT:410; IV Piggyback:500.2] Out: 8100 [Drains:525; Stool:600; Chest Tube:160] Intake/Output this shift: Total I/O In: 2156.2 [I.V.:940.2; Blood:315; Other:266.9; NG/GT:400; IV Piggyback:234.2] Out: 3060 [Drains:275; Other:2785]  General appearance: no distress Neurologic: intact Heart: regular rate and rhythm, S1, S2 normal, no murmur, click, rub or gallop Lungs: clear to auscultation bilaterally Abdomen: soft, non-tender; bowel sounds normal; no masses,  no organomegaly Extremities: extremities normal, atraumatic, no cyanosis or edema Wound: wound vac dressing intact  Lab Results: Recent Labs    02/09/20 0430 02/09/20 1519  WBC 14.5* 17.2*  HGB 7.8* 9.7*  HCT 23.5* 30.1*  PLT 237 278   BMET:  Recent Labs    02/09/20 0430 02/09/20 1519  NA 135 135  K 3.6 4.3  CL 104 103  CO2 22 24  GLUCOSE 238* 181*  BUN 20 23*  CREATININE 1.20 1.38*  CALCIUM 6.8* 7.9*    PT/INR:  Recent Labs    02/12/2020 2058  LABPROT 15.9*  INR 1.3*   ABG    Component  Value Date/Time   PHART 7.442 02/08/2020 1459   HCO3 25.1 02/08/2020 1459   TCO2 26 02/08/2020 1459   ACIDBASEDEF 1.0 02/08/2020 0101   O2SAT 78.3 02/09/2020 0430   CBG (last 3)  Recent Labs    02/09/20 1400 02/09/20 1509 02/09/20 1557  GLUCAP 168* 169* 146*    Assessment/Plan: S/P Procedure(s) (LRB): STERNAL WASHOUT WITH WOUND VAC CHANGE (N/A) GREATER OMENTAL FLAP CLOSURE OF STERNUM (N/A) OPEN TRACHEOSTOMY USING #8 SHILEY (N/A) OPEN INSERTION OF 16 FR. BOLUS GASTROSTOMY TUBE (N/A) 16 FR JEJUNOSTOMY INSERTION  (N/A) TRANSESOPHAGEAL ECHOCARDIOGRAM (TEE) (N/A) Diuresis appreciate Plastic Surgery consultation  Support hemodynamically with Impella   LOS: 41 days    Corey Palmer 02/09/2020

## 2020-02-09 NOTE — Progress Notes (Addendum)
[facility] Progress Note    Assessment/ Plan:    75M with familial cardiomyopathy EF20% s/p redo CABG 40/81/44 complicated by persistent shock, multiple attempts at sternal closure with formation of chest hematoma and tamponade, Afib with RVR, and AKI requiring CRRT.    #AKI/CKD stage III- presumably due to ischemic ATN in setting of cardiogenic shock s/p CABG.  CRRT started 01/16/20. All fluids 4K/2.5Ca.  Right femoral HD catheter placed 01/16/20 switched to [place] on 01/28/20. Replete K prn - continue CRRT--> Continue goal net negative today.  # CAD s/p CABG complicated by cardiogenic shock and post-op hematoma. secondary closure on 12/23 then had to be re opened.  Went to the OR for washout on 12/28, bedside washout 02/01/19.  OR 02/10/2020 for closure, then had emergent reopening at the bedside 02/23/2020 d/t anterior chest hematoma again. OR for chest closure on 1/10.  # Cardiogenic shock- underlying familial cardiomyopathy s/p redo CABG, EF ~20%. Remains on pressors: levo and vaso. Also on milrinone. Impella P8. Continue mgmt per primary  # ABLA- s/p postoperative bleeding into chest cavity s/p re-exploration for tamponade/hematoma. Received multiple blood products  # Atrial fibrillation - s/p DCCV on 12/16.  On amiodarone.  Normal sinus rhythm currently  # VDRF- per primary svc. Trach/peg 1/10  #-Leukocytosis/ new fever: cultures redrawn 02/01/20, s/p vanc and meropenem  # R loculated pleural effusion: for CT chest on 1/6 with contrast which is necessary for clinical decision making.   Subjective:     Seen on crrt. Net balance neg 3.5L. remains on pressors. K being repleted, on all 4K bags   Objective:   BP 97/62   Pulse 76   Temp 98.7 F (37.1 C) (Axillary)   Resp (!) 24   Ht 6' 3" (1.905 m)   Wt 103.8 kg   SpO2 99%   BMI 28.60 kg/m   Intake/Output Summary (Last 24 hours) at 02/09/2020 1216 Last data filed at 02/09/2020 1200 Gross per 24 hour  Intake  4590.63 ml  Output 8898 ml  Net -4307.37 ml   Weight change: -2.2 kg  Physical Exam: Gen: sedated CVS: Impella sounds obscure heart sounds CHEST: woundvac foam in place, 2 chest tubes in place, R [place] Impella Resp: coarse bilateral breath sounds, intubated Abd: no distention Ext: edema in all exts ACCESS: R IJ nontunneled HD cath  Imaging: DG CHEST PORT 1 VIEW  Result Date: 02/08/2020 CLINICAL DATA:  ETT, chest tube, open sternum EXAM: PORTABLE CHEST 1 VIEW COMPARISON:  Numerous priors, most recent 02/22/2020 FINDINGS: *Tracheostomy tube terminates 5 cm from the carina. *Dual lumen right IJ approach central venous catheter tip terminates at the superior cavoatrial junction. *Right upper extremity approach Impella device remains in stable position. *Left chest wall pacer/AICD with tip at the cardiac apex. *Mediastinal and bilateral pleural drains remain in place. *Pacer pads and telemetry leads overlie the chest. Stable cardiomegaly. Persistent hazy opacities throughout the lungs accounting for differences in technique. Stable bilateral effusions as well as a more lobular opacity towards the right lung apex. No pneumothorax. No new acute osseous or soft tissue abnormalities. IMPRESSION: Lines and tubes as above. Persistent hazy opacities throughout the lungs compatible with edema. Stable bilateral effusions as well as a more indeterminate lobular opacity towards the right lung apex. Electronically Signed   By: Lovena Le M.D.   On: 02/08/2020 06:56   DG Chest Port 1 View  Result Date: 01/29/2020 CLINICAL DATA:  50 year old male with tracheostomy. EXAM: PORTABLE CHEST 1 VIEW  COMPARISON:  Chest radiograph dated 02/12/2020. FINDINGS: Interval removal of the endotracheal tube and placement of a tracheostomy with tip approximately 5.2 cm above the carina. There has been interval removal of feeding tube. The remainder of the support apparatus in similar position. No significant interval change in  cardiomegaly and bilateral interstitial prominence and edema. Right upper lobe extrapleural loculated effusion or hematoma similar or slightly increased. No pneumothorax. IMPRESSION: 1. Interval placement of a tracheostomy with tip above the carina. 2. Slight interval increase in the size of the right upper lobe loculated pleural collection. No other significant interval change in the appearance of the lungs. Electronically Signed   By: Anner Crete M.D.   On: 02/28/2020 19:02    Labs: BMET Recent Labs  Lab 02/06/20 0448 02/06/20 1640 02/08/2020 0234 01/31/2020 1549 02/06/2020 1553 02/10/2020 1723 02/05/2020 2050 02/06/2020 2058 02/08/20 0101 02/08/20 0355 02/08/20 0400 02/08/20 1459 02/08/20 1502 02/09/20 0430  NA 133* 134* 134*   < > 134* 133*   < > 131* 134* 134* 133* 135 134* 135  K 3.9 3.8 3.9   < > 4.3 4.1   < > 4.5 4.6 4.7 4.7 4.3 4.4 3.6  CL 103 102 102  --  99 100  --  101  --   --  102  --  102 104  CO2 _0 --   --   --   --  22  --   --  23  --  24 22  GLUCOSE 153* 166* 152*  --  160* 208*  --  213*  --   --  137*  --  199* 238*  BUN 24* 24* 23*  --  20 22*  --  23*  --   --  21*  --  21* 20  CREATININE 1.47* 1.53* 1.43*  --  1.50* 1.50*  --  1.62*  --   --  1.50*  --  1.45* 1.20  CALCIUM 8.0* 7.9* 8.1*  --   --   --   --  7.4*  --   --  7.8*  --  7.6* 6.8*  PHOS 2.0* 1.7* 1.8*  --   --   --   --  4.5  --   --  3.7  --  3.3 2.7   < > = values in this interval not displayed.   CBC Recent Labs  Lab 02/06/20 0448 02/06/20 1402 02/09/2020 0234 02/01/2020 1549 01/30/2020 2058 02/08/20 0101 02/08/20 0400 02/08/20 1459 02/08/20 1502 02/09/20 0430  WBC 11.5*   < > 10.1  --  21.5*  --  16.1*  --  14.3* 14.5*  NEUTROABS 9.1*  --  7.6  --   --   --  12.9*  --   --  10.9*  HGB 7.7*   < > 7.9*   < > 7.1*   < > 7.9* 7.5* 7.3* 7.8*  HCT 23.3*   < > 24.0*   < > 22.8*   < > 25.0* 22.0* 22.0* 23.5*  MCV 90.7   < > 91.3  --  91.2  --  91.2  --  89.4 88.7  PLT 136*   < > 151  --  274   --  229  --  246 237   < > = values in this interval not displayed.    Medications:    . sodium chloride   Intravenous Once  . vitamin C  500 mg Per Tube TID  .  aspirin  324 mg Per Tube Daily  . atorvastatin  80 mg Per Tube Daily  . busPIRone  10 mg Per Tube TID  . chlorhexidine gluconate (MEDLINE KIT)  15 mL Mouth Rinse BID  . Chlorhexidine Gluconate Cloth  6 each Topical Daily  . clonazepam  0.25 mg Per Tube BID  . darbepoetin (ARANESP) injection - NON-DIALYSIS  100 mcg Subcutaneous Q Sat-1800  . docusate  100 mg Per Tube BID  . feeding supplement (PROSource TF)  45 mL Per Tube TID  . fiber  1 packet Per Tube BID  . FLUoxetine  40 mg Per Tube BID  . lidocaine  1 patch Transdermal Q24H  . mouth rinse  15 mL Mouth Rinse 10 times per day  . melatonin  5 mg Per Tube Daily  . midodrine  15 mg Per Tube TID WC  . multivitamin  1 tablet Per Tube QHS  . oxyCODONE  10 mg Per Tube Q6H  . pantoprazole (PROTONIX) IV  40 mg Intravenous Q12H  . polyethylene glycol  17 g Per Tube Daily  . sodium chloride flush  10-40 mL Intracatheter Q12H  . thiamine  100 mg Per Tube Daily  . valproic acid  250 mg Per Tube BID         02/09/2020, 12:16 PM  

## 2020-02-09 NOTE — Plan of Care (Signed)
  Problem: Cardiac: Goal: Ability to achieve and maintain adequate cardiopulmonary perfusion will improve Outcome: Not Progressing   Problem: Education: Goal: Knowledge of General Education information will improve Description: Including pain rating scale, medication(s)/side effects and non-pharmacologic comfort measures Outcome: Not Progressing   Problem: Health Behavior/Discharge Planning: Goal: Ability to manage health-related needs will improve Outcome: Not Progressing   Problem: Clinical Measurements: Goal: Ability to maintain clinical measurements within normal limits will improve Outcome: Not Progressing Goal: Diagnostic test results will improve Outcome: Not Progressing Goal: Respiratory complications will improve Outcome: Not Progressing Goal: Cardiovascular complication will be avoided Outcome: Not Progressing   Problem: Activity: Goal: Risk for activity intolerance will decrease Outcome: Not Progressing   Problem: Coping: Goal: Level of anxiety will decrease Outcome: Not Progressing   Problem: Skin Integrity: Goal: Risk for impaired skin integrity will decrease Outcome: Not Progressing   Problem: Education: Goal: Ability to demonstrate management of disease process will improve Outcome: Not Progressing Goal: Ability to verbalize understanding of medication therapies will improve Outcome: Not Progressing   Problem: Activity: Goal: Capacity to carry out activities will improve Outcome: Not Progressing   Problem: Cardiac: Goal: Ability to achieve and maintain adequate cardiopulmonary perfusion will improve Outcome: Not Progressing   Problem: Education: Goal: Ability to verbalize understanding of medication therapies will improve Outcome: Not Progressing   Problem: Activity: Goal: Capacity to carry out activities will improve Outcome: Not Progressing   Problem: Cardiac: Goal: Ability to achieve and maintain adequate cardiopulmonary perfusion will  improve Outcome: Not Progressing   Problem: Education: Goal: Will demonstrate proper wound care and an understanding of methods to prevent future damage Outcome: Not Progressing Goal: Knowledge of disease or condition will improve Outcome: Not Progressing Goal: Knowledge of the prescribed therapeutic regimen will improve Outcome: Not Progressing Goal: Individualized Educational Video(s) Outcome: Not Progressing   Problem: Activity: Goal: Risk for activity intolerance will decrease Outcome: Not Progressing   Problem: Cardiac: Goal: Will achieve and/or maintain hemodynamic stability Outcome: Not Progressing   Problem: Clinical Measurements: Goal: Postoperative complications will be avoided or minimized Outcome: Not Progressing   Problem: Respiratory: Goal: Respiratory status will improve Outcome: Not Progressing   Problem: Skin Integrity: Goal: Wound healing without signs and symptoms of infection Outcome: Not Progressing Goal: Risk for impaired skin integrity will decrease Outcome: Not Progressing   Problem: Urinary Elimination: Goal: Ability to achieve and maintain adequate renal perfusion and functioning will improve Outcome: Not Progressing

## 2020-02-09 NOTE — Progress Notes (Signed)
NAME:  Corey Palmer, MRN:  [id number], DOB:  03-22-1970, LOS: 76 ADMISSION DATE:  01/24/2020, CONSULTATION DATE: 01/16/2020 REFERRING MD: Aundra Dubin, CHIEF COMPLAINT: Respiratory failure post Impella  HPI/course in hospital  50 year old man admitted with A. fib/RVR, torsades, cardiogenic shock [EF 15] requiring Impella, CABG x5, AKI requiring initiation of CRRT  Past Medical History:    has a past medical history of Anginal pain ([place]), Anxiety, Arthritis, Automatic implantable cardioverter-defibrillator in situ (2007), Bipolar disorder ([place]), CAD (coronary artery disease), Cancer ([place]) (2013), CHF (congestive heart failure) ([place]), Chondrocalcinosis of right knee (9/62/8366), Chronic systolic heart failure ([place]), Depression, Diabetes uncomplicated adult-type II, Dilated cardiomyopathy ([place]), Dyslipidemia, Dysrhythmia, GERD (gastroesophageal reflux disease), Gout, unspecified, Hepatic steatosis (2011), History of kidney stones, echocardiogram, Hypertension, Obesity (BMI 30.0-34.9), Pacemaker, Paroxysmal atrial fibrillation ([place]), Peptic ulcer, Shortness of breath, and Sleep apnea.  Significant Hospital Events:  12/2 Afib with RVR. Cardioverted in ED. AICD did not fire. Milrinone for low co ox.  12/7 ICD fired, Torsades- likely from low K of 2.8.  12/8 Cath- multivessel disease w/ low CO. Volume overloaded.  12/11 Underwent Impella 5.5 insertion for persistent symptoms of forward failure with low cardiac indices. 12/12 Back to the OR for displacement of Impella. Extubated.  12/15 CABG x4, with LIMA-LAD, SVG-PDA/PLV, SVG-OM 12/16 Emergently opened chest at bedside for tamponade, clot compressing right atrium  12/16 To OR for Exploration of chest due to bleeding  12/19 Started CRRT 12/23 Sternal Closure in OR 12/24 Worsening shock.  Chest reopened at bedside with improvement in hemodynamics.  No mediastinal hematoma. 12/25 A flutter > rapid a paced to sinus rhythm.  Remains on high-dose pressors,  inotropes 12/28 Weaning vasopressor requirements.  For chest washout today.  Back in atrial fibrillation, refractory to increased amiodarone and attempted overdrive pacing.  DCCV to NSR. Tolerating fluid removal via CRRT. 1/3: Appears euvolemic.  Adjusting glycemic control.  Chest still open so not weaning 1/4, spiking fever, Vanco meropenem resumed after culture sent from blood and sputum. 1/5 awaiting return to the OR for wound VAC dressing assessment fever curve and white blood cell count improving after antibiotics resumed the day prior, did have chest closed, developed tamponade physiology shortly after had to go back for emergent reopening of chest, return to intensive care and refractory shock 1/6: Wound VAC dressing replaced.  Still in shock.  Starting to wean sedation 1/7: Still on high-dose pressors  Consults:  PCCM, nephrology, cardiology, cardiothoracic surgery  Procedures:  12/2 R PICC >> 12/11 right ax impella 5.5 >> 12/15 ETT >> 12/15 right femoral arterial sheath >> 12/19 right femoral HD >> 12/23 chest tube-right pleural, left pleural , mediastinal  >> 1/10  Trach->1/11 1/10 PEG->1/11  Significant Diagnostic Tests:    Micro Data:  Blood culture 12/14 > negative  Blood culture 12/16 > negative Respiratory culture 12/20 > rare candida albicans Sternal wound culture 12/23 > no growth Fungal cx sternal wound 12/23 >> neg Fungal cx w/stain sternal wound 12/23 >> negative AFB sternal wound 12/23 >> negative 1/4 blood culture x2>>> neg 1/4 respiratory culture>>> rare candida albicans Sputum 1/9 > negative  Antimicrobials:  Vancomycin 12/16 >>12/31 Meropenem 12/16 >> 12/31 Vancomycin and meropenem resumed on 1/4->1/12  Interval history:  1/12: awake today, appears more comfortable, follows commands consistently. Remains on cardiac support and crrt 1/11: arousable follows commands. Tachycardic and tachypneic this am. On vent. S/p trach/ j and g tube yesterday.  Feeding thru j tube. Omental flap closure of sterum with wound vanc  application. Remains on crrt on some vasopressor support as well.   Objective   Blood pressure 102/70, pulse 76, temperature 98.4 F (36.9 C), temperature source Axillary, resp. rate (!) 24, height 6\' 3"  (1.905 m), weight 103.8 kg, SpO2 99 %. CVP:  [6 mmHg-9 mmHg] 9 mmHg  Vent Mode: PRVC FiO2 (%):  [50 %] 50 % Set Rate:  [8 bmp-28 bmp] 28 bmp Vt Set:  [510 mL] 510 mL PEEP:  [5 cmH20] 5 cmH20 Plateau Pressure:  [24 cmH20-28 cmH20] 25 cmH20   Intake/Output Summary (Last 24 hours) at 02/09/2020 1300 Last data filed at 02/09/2020 1200 Gross per 24 hour  Intake 4453.5 ml  Output 8659 ml  Net -4205.5 ml   Filed Weights   02/13/2020 0500 02/08/20 0615 02/09/20 0500  Weight: 106.4 kg 106 kg 103.8 kg   CVP:  [6 mmHg-9 mmHg] 9 mmHg  Examination: General: middle aged man laying in bed sedated but arousable HEENT: [place]/AT, eyes anicteric, oral mucosa moist. trach, NGT Pulmonary: Rhonchi bilaterally Cardiac: Impella P8 R axillary. Wound vac w/ open chest. Abdomen: soft, NT, ND Extremities:pitting edema in all extremities Neuro: RASS -1, tracks with his eyes, squeeze hand to command, moves feet Derm: pallor, no rashes   No new imaging today  Assessment & Plan:   Acute hypoxic and hypercapnic respiratory failure requiring mechanical ventilation following cardiac surgery. Persistent right sided loculated pleural effusions- apically and in the base HAP- organism unknown -Con't LTVV, 4-8cc/kg IBW with goal Pplat<30 and DP<15 -s/p trach attempting to finesse sedation to allow for comfort but also cpap vs atc -are changing continuous opioid to dilaudid at this time, perhaps will be able to utilize less -cont enteral -stop versed -PAD protocol for sedation -VAP prevention protocol. -Wean FiO2 as able to maintain SPO2 greater than 90%.  Cardiogenic shock s/p high risk CABG. Progressive shock on 1/5 secondary to obstructive  physiology from hematoma after attempted chest closure necessitating emergent reopening of chest with reevacuation Post cardiac surgery RV dysfunction CAD s/p CABG, c/b cardiac tamponade, hematoma evac.  Open chest with VAC.   -Continue Impella P8 w/ heparin purge. LDH improved -Continue vasopressor and inotropic support -Continue amiodarone infusion -Continue aspirin and statin. -Needs significant volume removal; making progress tolerating UF. -increasing oral to wean or stop continuous IV  Sepsis/septic shock, likely HAP -stop abx at this time with negative cx -Repeat trach aspirate culture negative  Paroxysmal Afib; Currently normal sinus -Continuing IV heparin purge with impella. Systemic drip on hold due to mediastinal bleeding.  -Continuing IV amiodarone -Continuing telemetry monitoring  Acute kidney injury with anuria; likely cardiorenal syndrome -Con't CVVHD per nephrology.   DM2 with hyperglycemia -insulin gtt  Prolonged qt:  -stopped seroquel/zofran -avoid prolonging agents -goal mag >2 and K >4 -monitor intermittently   Acute blood loss anemia, anemia of critical illness Acute thrombocytopenia, likely due to acute illness, possibly due to consumption from impella -Continue to trend daily CBC -transfusions per TCTS -platelet transfusion if <100K with major bleeding  Deconditioning -s/p trach -will need aggressive rehabilitation after prolonged critical illness  Daily Goals Checklist  Pain/Anxiety/Delirium protocol (if indicated): PAD protocol VAP protocol (if indicated): bundle in place. DVT prophylaxis:  On hold 2/2 bleeding Nutrition Status: via j/g tube slwoly increasing GI prophylaxis: pantoprazole Glucose control: insulin gtt Mobility/therapy needs: bed rest  Code Status: full  Family Communication: per primary Disposition: ICU Critical care time: The patient is critically ill with multiple organ systems failure and requires high complexity decision  making for assessment and support, frequent evaluation and titration of therapies, application of advanced monitoring technologies and extensive interpretation of multiple databases.  Critical care time 38 mins. This represents my time independent of the NPs time taking care of the pt. This is excluding procedures.    [facility] 02/09/2020, 1:00 PM

## 2020-02-09 NOTE — Progress Notes (Addendum)
Patient ID: Corey Palmer, male   DOB: 1970-10-02, 50 y.o.   MRN: [id number]     Advanced Heart Failure Rounding Note  PCP-Cardiologist: No primary care provider on file.   Subjective:    - 12/7 Torsades -ICD shock x1.  - 12/11 Impella 5.5 placed.  - 12/12 Impella repositioned in OR - 12/15 CABG x 4 with LIMA-LAD, SVG-PDA/PLV, SVG-OM - 12/16 DCCV afib.  Back to OR to re-open chest and again to evacuate hematoma.  - 12/19 CVVH initiated  - 12/23 Chest closed - 12/24 Chest reopened - 12/26 RAP back into NSR - 12/27 back in atrial fibrillation, unable to rapid atrial pace out.   - 12/28 To OR, chest partially closed and wound vac placed.  DCCV to NSR.  - 12/31 Antibiotics stopped. Swan removed, HD catheter moved from right femoral to right IJ.  - 1/5 To OR for chest closure. Chest reopened emergently at bedside again to evacuate hematoma  - 1/10 To OR: omental flap closure of chest with wound vac, tracheostomy, G-tube, J-tube.      On milrinone 0.125 + NE 42 + VP 0.04 + midodrine 15 tid. Co-ox good at 78%. CVP 9   On CVVH pulling net 50-100 cc/hr UF   WBC 16>>14K, AF.  Remains on vancomycin/meropenem.   Heparin through impella purge. Hgb 7.8 today. PEG w/ bloody drainage.   On Amio gtt 88. Remains in NSR.    Awake and follows commands.   Impella 5.5 P-8  Flow 4.3 Waveforms ok. No alarms LDH 332 => 381=> 396=>408 => 392=>383    Objective:   Weight Range: 103.8 kg Body mass index is 28.6 kg/m.   Vital Signs:   Temp:  [98.4 F (36.9 C)-98.9 F (37.2 C)] 98.8 F (37.1 C) (01/12 0400) Pulse Rate:  [54-90] 80 (01/12 0700) Resp:  [24-28] 28 (01/12 0411) BP: (81-116)/(55-87) 108/87 (01/12 0700) SpO2:  [95 %-100 %] 99 % (01/12 0700) Arterial Line BP: (87-127)/(53-72) 121/61 (01/12 0700) FiO2 (%):  [50 %] 50 % (01/12 0411) Weight:  [103.8 kg] 103.8 kg (01/12 0500) Last BM Date: 02/09/20  Weight change: Filed Weights   02/22/2020 0500 02/08/20 0615 02/09/20 0500   Weight: 106.4 kg 106 kg 103.8 kg    Intake/Output:   Intake/Output Summary (Last 24 hours) at 02/09/2020 0759 Last data filed at 02/09/2020 0700 Gross per 24 hour  Intake 4412.27 ml  Output 8100 ml  Net -3687.73 ml      Physical Exam   CVP 9  General: critically ill but awake and follow commands. On Vent through TC. No distress  HEENT: + TC  Neck: supple. no JVD. Carotids 2+ bilat; no bruits. No lymphadenopathy or thyromegaly appreciated. Cor: PMI nondisplaced. Regular rate & rhythm. No rubs, gallops or murmurs. + sternal wound vac Lungs: clear Abdomen: soft, nontender, nondistended. No hepatosplenomegaly. No bruits or masses. Good bowel sounds. + PEG  Extremities: no cyanosis, clubbing, rash, 1-2+ edema up to thighs  Neuro: alert and follows commands.    Telemetry   NSR 80s (personally reviewed)  Labs    CBC Recent Labs    02/08/20 0400 02/08/20 1459 02/08/20 1502 02/09/20 0430  WBC 16.1*  --  14.3* 14.5*  NEUTROABS 12.9*  --   --  10.9*  HGB 7.9*   < > 7.3* 7.8*  HCT 25.0*   < > 22.0* 23.5*  MCV 91.2  --  89.4 88.7  PLT 229  --  246 237   < > =  values in this interval not displayed.   Basic Metabolic Panel Recent Labs    02/08/20 0400 02/08/20 1459 02/08/20 1502 02/09/20 0430  NA 133*   < > 134* 135  K 4.7   < > 4.4 3.6  CL 102  --  102 104  CO2 23  --  24 22  GLUCOSE 137*  --  199* 238*  BUN 21*  --  21* 20  CREATININE 1.50*  --  1.45* 1.20  CALCIUM 7.8*  --  7.6* 6.8*  MG 2.2  --   --  1.9  PHOS 3.7  --  3.3 2.7   < > = values in this interval not displayed.   Liver Function Tests Recent Labs    02/08/20 1502 02/09/20 0430  ALBUMIN 1.5* 1.4*   No results for input(s): LIPASE, AMYLASE in the last 72 hours. Cardiac Enzymes No results for input(s): CKTOTAL, CKMB, CKMBINDEX, TROPONINI in the last 72 hours.  BNP: BNP (last 3 results) Recent Labs    01/15/2020 1619  BNP 577.5*    ProBNP (last 3 results) No results for input(s): PROBNP  in the last 8760 hours.   D-Dimer No results for input(s): DDIMER in the last 72 hours. Hemoglobin A1C No results for input(s): HGBA1C in the last 72 hours. Fasting Lipid Panel No results for input(s): CHOL, HDL, LDLCALC, TRIG, CHOLHDL, LDLDIRECT in the last 72 hours. Thyroid Function Tests No results for input(s): TSH, T4TOTAL, T3FREE, THYROIDAB in the last 72 hours.  Invalid input(s): FREET3  Other results:   Imaging    No results found.   Medications:     Scheduled Medications: . vitamin C  500 mg Per Tube TID  . aspirin  324 mg Per Tube Daily  . atorvastatin  80 mg Per Tube Daily  . busPIRone  10 mg Per Tube TID  . chlorhexidine gluconate (MEDLINE KIT)  15 mL Mouth Rinse BID  . Chlorhexidine Gluconate Cloth  6 each Topical Daily  . clonazepam  0.25 mg Per Tube BID  . darbepoetin (ARANESP) injection - NON-DIALYSIS  100 mcg Subcutaneous Q Sat-1800  . docusate  100 mg Per Tube BID  . feeding supplement (PROSource TF)  45 mL Per Tube TID  . fiber  1 packet Per Tube BID  . FLUoxetine  40 mg Per Tube BID  . lidocaine  1 patch Transdermal Q24H  . mouth rinse  15 mL Mouth Rinse 10 times per day  . melatonin  5 mg Per Tube Daily  . midodrine  15 mg Per Tube TID WC  . multivitamin  1 tablet Per Tube QHS  . oxyCODONE  10 mg Per Tube Q6H  . pantoprazole sodium  40 mg Per Tube Daily  . polyethylene glycol  17 g Per Tube Daily  . sodium chloride flush  10-40 mL Intracatheter Q12H  . thiamine  100 mg Per Tube Daily  . valproic acid  250 mg Per Tube BID    Infusions: .  prismasol BGK 4/2.5 500 mL/hr at 02/09/20 0608  .  prismasol BGK 4/2.5 200 mL/hr at 02/08/20 2118  . sodium chloride Stopped (01/29/20 0955)  . sodium chloride    . amiodarone 30 mg/hr (02/09/20 0700)  . electrolyte-A Stopped (02/03/20 5374)  . epinephrine Stopped (02/08/20 0651)  . feeding supplement (PIVOT 1.5 CAL) 30 mL/hr at 02/09/20 0100  . fentaNYL infusion INTRAVENOUS 225 mcg/hr (02/09/20 0700)   . impella catheter heparin 50 unit/mL in dextrose 5%    .  insulin 1.7 mL/hr at 02/09/20 0700  . lactated ringers 20 mL/hr at 02/09/20 0700  . meropenem (MERREM) IV Stopped (02/09/20 4332)  . midazolam 1 mg/hr (02/09/20 0700)  . milrinone 0.125 mcg/kg/min (02/09/20 0700)  . norepinephrine (LEVOPHED) Adult infusion 44 mcg/min (02/09/20 0700)  . potassium chloride 10 mEq (02/09/20 0739)  . prismasol BGK 4/2.5 1,500 mL/hr at 02/09/20 0607  . vancomycin Stopped (02/08/20 1506)  . vasopressin 0.04 Units/min (02/09/20 0700)    PRN Medications: sodium chloride, Place/Maintain arterial line **AND** sodium chloride, artificial tears, bisacodyl **OR** bisacodyl, docusate, fentaNYL, fentaNYL (SUBLIMAZE) injection, fentaNYL (SUBLIMAZE) injection, heparin, heparin, metoprolol tartrate, midazolam, midazolam, polyethylene glycol, sodium chloride flush     Assessment/Plan   1. Cardiogenic shock/acute on chronic systolic CHF: Initially nonischemic cardiomyopathy. Possible familial cardiomyopathy as both parents had cardiomyopathy and died at around 96.However, [employer] gene testing did not show any common mutation for cardiomyopathy.  However, this admission noted to have severe 3 vessel disease so suspect component of ischemic cardiomyopathy. [employer] ICD. Echo in 8/20 with EF 15% and mildly decreased RV function. Echo this admission with EF < 20%, moderate LV dilation, RV mildly reduced, severe LAE, no significant MR.Low output HF with markedly low EF.  He has a long history of cardiomyopathy (20 yrs), tends to minimize symptoms.  Cardiorenal syndrome with creatinine up to 2.2,  stabilized on milrinone and Impella 5.5. SCr 1.44 day of CABG. CABG 12/15.  Post-op shock, back to OR 12/16 to open chest (chest wall compartment syndrome) and again to evacuate hematoma.  TEE post-op with severe RV dysfunction.  CVVHD initiated 12/19 for volume removal in setting of rising Scr/decreased UOP. Suspect ATN. S/p chest  closure 12/23. Hemodynamics much worse on 12/24 with rising pressor demands. Co-ox 36%.  Bedside echo severe biventricular dysfunction with marked septal bounce. Chest re-opened 12/24.  Was atrial paced back to NSR on 12/26 with improvement in hemodynamics but back in atrial fibrillation on 12/27 and unable to pace out, DCCV to NSR on 12/28. Went back to OR 1/5 for chest closure but several hours later required emergent re-opening of chest at bedside to evacuate hematoma. To OR again 1/10 with closure of chest with omental flap. On NE 42, VP 0.04, off epinephrine. Milrinone 0.125. Midodrine 15 tid. Impella at P-8 with position adjusted 1/10 in OR. Flow 4.3. No alarms. Co-ox 78%. CVP 8-9, remains edematous (3rd spacing).  - Continue slow wean of NE.  - Continue milrinone at 0.125.  - Continue Impella at P-8 while he is on high pressor doses.  - Aim for net UF 50-100 cc/hr via CVVH today.   2. Atrial fibrillation: H/o PAF.  He was on dofetilide in the remote past but this was stopped due to noncompliance. He had an upper GI bleed from antral ulcers in 3/12. He was seen by GI and was deemed safe to restart anticoagulation as long as he remains on a PPI.  No apparent recurrence of AF until just prior to this admission, was cardioverted back to NSR in ER but went back into atrial fibrillation.  Post-op afib, DCCV to NSR/BiV pacing am 12/16 -> back to AF. Rapid atrial pacing back to SR on 12/26 -> back in AF on 12/27 and unable to pace out. 12/28 DCCV to NSR.  Maintaining NSR.  - Continue IV amiodarone at 30 mg/hr.   - Currently off systemic heparin with chest bleeding. Getting heparin through impella purge  - Did not have Maze with CABG.  3. CAD:   Cath this admission with severe 3 vessel disease. CABG x 4 on 12/15.  - Continue atorvastatin, ASA.  - continue current management 4. Acute on chronic hypoxemic respiratory failure: Vent per CCM.  Has tracheostomy.  - CCM following 5. AKI on CKD stage 3:  Likely ATN/cardiorenal. Anuric. On CVVH.    - As above, aim for net negative 50-100 cc/hr via CVVH today. 6. Diabetes: Insulin.  7. Hyponatremia: Resolved with CVVH.  8. Torsades/ICD shock: 12/7/212 in hospital event, in setting of severe hypokalemia and hypomagnesemia.   9. Gout: h/o gout. Complained of rt knee pain c/w previous flares - treated w/ prednisone burst (completed).  10: ID: Treating for PNA.  Afebrile.  Cultures NGTD.   - 1/4 restarted vancomycin and meropenem, per CCM.  11. FEN: Restart TFs via J-tube. 12. Anemia: Hgb 7.8 today.  Transfuse < 7.5.  13. Stage II Pressure Ulcer- sacrum -Reposition every 2 hours R/L  14. Pleural Effusion  - loculated rt apical pleural effusion noted on imaging.  - continue UF through CVVH  - PCCM following   Lyda Jester, PA-C  02/09/2020 7:59 AM   Agree with above.   Awake at times. Sternum still open (has omental flap). Remains on NE 40 and Impella. P-8. On CVVHD. Weight now down to pre-op weight. Bleeding from G-tube. Got 1u RBCs  General:  Awake on vent HEENT: normal Neck: supple. + trialysis Carotids 2+ bilat; no bruits. No lymphadenopathy or thryomegaly appreciated. Cor: Chest open Regular  + impella site ok  Lungs: coarse Abdomen: soft, nontender, nondistended. No hepatosplenomegaly. No bruits or masses. Good bowel sounds. Extremities: no cyanosis, clubbing, rash, 1-2+ edema Neuro: awake. Not following commands for me.   Remains critically ill. Awake on vent. On high-dose pressors and Impella and CVVHD. Wean inotropes as tolerated. Plastics has seen for possible muscle flap closure. Watch G-tube site. Impella parameters stable.    Prognosis very guarded. Willconsider Palliative involvement to follow along and discuss goals of care.   CRITICAL CARE Performed by: Glori Bickers  Total critical care time: 45 minutes  Critical care time was exclusive of separately billable procedures and treating other  patients.  Critical care was necessary to treat or prevent imminent or life-threatening deterioration.  Critical care was time spent personally by me (independent of midlevel providers or residents) on the following activities: development of treatment plan with patient and/or surrogate as well as nursing, discussions with consultants, evaluation of patient's response to treatment, examination of patient, obtaining history from patient or surrogate, ordering and performing treatments and interventions, ordering and review of laboratory studies, ordering and review of radiographic studies, pulse oximetry and re-evaluation of patient's condition.  Glori Bickers, MD  5:34 PM

## 2020-02-09 NOTE — H&P (View-Only) (Signed)
Reason for Consult: Sternal wound Referring Physician: Dr. Murvin Natal  Corey Palmer is an 50 y.o. male.  HPI: The patient is a 50 year old male in the hospital for treatment of multiple conditions primarily heart failure and cardiogenic shock.  He was admitted the beginning of December.  On 12/11 he was taken to the operating room by Dr. Julien Girt and underwent placement of an LVAD.  On 12/12 he was taken back to the OR for repositioning of the LVAD due to displacement.  On 12/15 he was taken back to the OR for a redo median sternotomy with coronary artery bypass grafting x4.  The left internal mammary artery was used, a saphenous vein, a right thigh and lower leg vein.  The following day his chest was open urgently with concerns for cardiac tamponade.  On 12/23 the patient was taken back to the OR for closure of the sternal wound.  On 12/28 the patient was returned to the operating room for sternal washout and VAC placement.  Another attempt for sternal closure was made on 1/5.  The patient was returned to the operating room on 1/10 for a sternal washout, a greater omental flap and VAC placement. He is now in respiratory and renal failure.  He is on the vent and receiving hemodialysis.  The VAC is in place and it appears that he has a large sternal wound.  His wife, Corey Palmer, is at the bedside.  His past medical history as listed below.   Past Medical History:  Diagnosis Date  . Anginal pain ([place])   . Anxiety   . Arthritis   . Automatic implantable cardioverter-defibrillator in situ 2007   [employer]   . Bipolar disorder ([place])   . CAD (coronary artery disease)    Cardiac cath (08/19/2000) - Nonobstructive, 40% stenosis of LAD,.   . Cancer [facility]) 2013   benign stomach cancer  . CHF (congestive heart failure) ([place])   . Chondrocalcinosis of right knee 06/11/2013  . Chronic systolic heart failure ([place])    2D echo (16/1096) - Systolic function severely reduced., LV EF 20-25%. Akinesis of apical and  anteroseptal mycoardium.  last 2D echo (11/2008 ), LV EF 25-30%, diffuse hypokinesis, and grade 1 diastolic dysfunction.  . Depression   . Diabetes uncomplicated adult-type II    on insulin therapy, a1c 12.9 in 01/2011  . Dilated cardiomyopathy ([place])    status post ICD placement in 2008 c/b tearing at the atrial junction resulting in tamponade and urgent thoracotomy  . Dyslipidemia   . Dysrhythmia   . GERD (gastroesophageal reflux disease)   . Gout, unspecified   . Hepatic steatosis 2011   seen on ultrasound.   . History of kidney stones   . Hx of echocardiogram    Echo (04/2013):  Mild LVH, EF 20-25%, mild LAE, mild to mod RVE with mild reduced RVSF.  Mar[place] Hypertension   . Obesity (BMI 30.0-34.9)   . Pacemaker   . Paroxysmal atrial fibrillation (HCC)    on chronic coumadin, goal INR 2-3. status post direct current  . Peptic ulcer   . Shortness of breath   . Sleep apnea    does not have CPAP    Past Surgical History:  Procedure Laterality Date  . APPLICATION OF WOUND VAC N/A 01/03/2020   Procedure: APPLICATION OF WOUND VAC;  Surgeon: Prescott Gum, Collier Salina, MD;  Location: [place];  Service: Thoracic;  Laterality: N/A;  . APPLICATION OF WOUND VAC N/A 01/23/2020   Procedure: APPLICATION OF  WOUND VAC;  Surgeon: [name], MD;  Location: [place];  Service: Thoracic;  Laterality: N/A;  . APPLICATION OF WOUND VAC N/A 01/31/2020   Procedure: WOUND VAC CHANGE;  Surgeon: [name], MD;  Location: [place];  Service: Thoracic;  Laterality: N/A;  . BEDSIDE EVACUATION OF HEMATOMA N/A 01/02/2020   Procedure: CHEST EXPLORATION _0 ;  Surgeon: Ivin Poot, MD;  Location: [place];  Service: Cardiovascular;  Laterality: N/A;  . BEDSIDE EVACUATION OF HEMATOMA N/A 01/03/2020   Procedure: CHEST EXPLORATION _1 ;  Surgeon: [name], MD;  Location: [place];  Service: Open Heart Surgery;  Laterality: N/A;  . BIOPSY  12/01/2019   Procedure: BIOPSY;  Surgeon: Arta Silence, MD;  [facility];  Service: Endoscopy;;  . CARDIAC CATHETERIZATION    . CARDIAC PACEMAKER PLACEMENT    . CHOLECYSTECTOMY    . COLONOSCOPY    . CORONARY ARTERY BYPASS GRAFT    . CORONARY ARTERY BYPASS GRAFT N/A 01/16/2020   Procedure: CORONARY ARTERY BYPASS GRAFTING (CABG), ON PUMP, TIMES FOUR, USING LEFT INTERNAL MAMMARY ARTERY AND ENDOSCOPICALLY HARVESTED RIGHT GREATER SAPHENOUS VEIN;  Surgeon: [name], MD;  Location: [place];  Service: Open Heart Surgery;  Laterality: N/A;  . ESOPHAGOGASTRODUODENOSCOPY  04/05/2011   Procedure: ESOPHAGOGASTRODUODENOSCOPY (EGD);  Surgeon: Irene Shipper, MD;  Location[facility];  Service: Endoscopy;  Laterality: N/A;  . ESOPHAGOGASTRODUODENOSCOPY (EGD) WITH PROPOFOL N/A 12/01/2019   Procedure: ESOPHAGOGASTRODUODENOSCOPY (EGD) WITH PROPOFOL;  Surgeon: Arta Silence, MD;  [facility];  Service: Endoscopy;  Laterality: N/A;  . EXPLORATION POST OPERATIVE OPEN HEART N/A 01/26/2020   Procedure: RE-EXPLORATION POST OPERATIVE OPEN HEART;  Surgeon: [name], MD;  Location: [place];  Service: Open Heart Surgery;  Laterality: N/A;  . GASTROSTOMY N/A 02/22/2020   Procedure: OPEN INSERTION OF 16 FR. BOLUS GASTROSTOMY TUBE;  Surgeon: [name], MD;  [facility];  Service: Thoracic;  Laterality: N/A;  . GREATER OMENTAL FLAP CLOSURE N/A 01/30/2020   Procedure: GREATER OMENTAL FLAP CLOSURE OF STERNUM;  Surgeon: [name], MD;  Location: [place];  Service: Thoracic;  Laterality: N/A;  . JEJUNOSTOMY N/A 02/20/2020   Procedure: 36 FR JEJUNOSTOMY INSERTION ;  Surgeon: [name], MD;  [facility];  Service: Thoracic;  Laterality: N/A;  . JOINT REPLACEMENT     left knee replacement  . KNEE ARTHROSCOPY Right 06/11/2013   Procedure: RIGHT KNEE ARTHROSCOPY with chondroplasty;  Surgeon: Johnny Bridge, MD;  Location: [place];  Service: Orthopedics;  Laterality: Right;  . PLACEMENT OF IMPELLA LEFT VENTRICULAR ASSIST DEVICE N/A 01/07/2020    Procedure: PLACEMENT OF IMPELLA LEFT VENTRICULAR ASSIST DEVICE;  Surgeon: [name], MD;  Location: [place];  Service: Open Heart Surgery;  Laterality: N/A;  . PLACEMENT OF IMPELLA LEFT VENTRICULAR ASSIST DEVICE Right 01/19/2020   Procedure: AXILLARY  ARTERY PLACEMENT OF ABIOMED IMPELLA  5.5 LEFT VENTRICULAR ASSIST DEVICE;  Surgeon: [name], MD;  Location: [place];  Service: Open Heart Surgery;  Laterality: Right;  . RIGHT/LEFT HEART CATH AND CORONARY ANGIOGRAPHY N/A 01/01/2020   Procedure: RIGHT/LEFT HEART CATH AND CORONARY ANGIOGRAPHY;  Surgeon: Larey Dresser, MD;  Location: [place] [facility];  Service: Cardiovascular;  Laterality: N/A;  . STERNAL CLOSURE N/A 01/04/2020   Procedure: STERNAL CLOSURE;  Surgeon: Ivin Poot, MD;  Location: [place];  Service: Thoracic;  Laterality: N/A;  . STERNAL CLOSURE N/A 02/13/2020   Procedure: STERNAL CLOSURE;  Surgeon: [name], MD;  Location: [place];  Service: Thoracic;  Laterality: N/A;  . STERNAL WOUND DEBRIDEMENT N/A 12/31/2019   Procedure: STERNAL WASH OUT;  Surgeon: [name], MD;  Location: [place];  Service: Thoracic;  Laterality: N/A;  . STERNAL WOUND DEBRIDEMENT N/A 02/08/2020   Procedure: STERNAL WASHOUT;  Surgeon: [name], MD;  Location: [place];  Service: Thoracic;  Laterality: N/A;  . STERNAL WOUND DEBRIDEMENT N/A 01/30/2020   Procedure: STERNAL WASHOUT WITH WOUND VAC CHANGE;  Surgeon: [name], MD;  Location: [place];  Service: Thoracic;  Laterality: N/A;  . TEE WITHOUT CARDIOVERSION N/A 01/27/2020   Procedure: TRANSESOPHAGEAL ECHOCARDIOGRAM (TEE);  Surgeon: [name], MD;  Location: [place];  Service: Open Heart Surgery;  Laterality: N/A;  . TEE WITHOUT CARDIOVERSION N/A 12/30/2019   Procedure: TRANSESOPHAGEAL ECHOCARDIOGRAM (TEE);  Surgeon: [name], MD;  Location: [place];  Service: Open Heart Surgery;  Laterality: N/A;  . TEE WITHOUT CARDIOVERSION N/A 01/14/2020   Procedure:  TRANSESOPHAGEAL ECHOCARDIOGRAM (TEE);  Surgeon: Prescott Gum, Collier Salina, MD;  Location: [place];  Service: Thoracic;  Laterality: N/A;  . TEE WITHOUT CARDIOVERSION N/A 02/08/2020   Procedure: TRANSESOPHAGEAL ECHOCARDIOGRAM (TEE);  Surgeon: [name], MD;  Location[facility];  Service: Thoracic;  Laterality: N/A;  . TEE WITHOUT CARDIOVERSION N/A 02/23/2020   Procedure: TRANSESOPHAGEAL ECHOCARDIOGRAM (TEE);  Surgeon: [name], MD;  Location: [facility] OR;  Service: Thoracic;  Laterality: N/A;  . TOTAL KNEE ARTHROPLASTY Left   . TRACHEOSTOMY TUBE PLACEMENT N/A 01/29/2020   Procedure: OPEN TRACHEOSTOMY USING #8 SHILEY;  Surgeon: [name], MD;  Location: [facility] OR;  Service: Thoracic;  Laterality: N/A;    Family History  Problem Relation Age of Onset  . Diabetes Mother   . Coronary artery disease Mother 13  . Gout Mother   . Heart disease Mother   . Heart attack Mother 76  . Diabetes Father   . Coronary artery disease Father 76       Died in a house fire   . Heart disease Father   . Heart disease Brother     Social History:  reports that he has never smoked. He has never used smokeless tobacco. He reports that he does not drink alcohol and does not use drugs.  Allergies:  Allergies  Allergen Reactions  . Orange Fruit Anaphylaxis, Hives and Other (See Comments)    Per Pt- Blisters around lips and Hives all over, also  . Penicillins Anaphylaxis    Did it involve swelling of the face/tongue/throat, SOB, or low BP? Yes Did it involve sudden or severe rash/hives, skin peeling, or any reaction on the inside of your mouth or nose? Yes Did you need to seek medical attention at a hospital or doctor's office? No When did it last happen?childhood If all above answers are "NO", may proceed with cephalosporin use.  [name] [Empagliflozin] Itching  . Basaglar Corey Rigg [Insulin Glargine] Nausea And Vomiting    Medications: I have reviewed the patient's current medications.  Results for  orders placed or performed during the hospital encounter of 01/07/2020 (from the past 48 hour(s))  I-STAT 7, (LYTES, BLD GAS, ICA, H+H)     Status: Abnormal   Collection Time: 02/26/2020  3:49 PM  Result Value Ref Range   pH, Arterial 7.323 (L) 7.350 - 7.450   pCO2 arterial 50.9 (H) 32.0 - 48.0 mmHg   pO2, Arterial 78 (L) 83.0 - 108.0 mmHg  Bicarbonate 26.6 20.0 - 28.0 mmol/L   TCO2 28 22 - 32 mmol/L   O2 Saturation 95.0 %   Acid-Base Excess 0.0 0.0 - 2.0 mmol/L   Sodium 135 135 - 145 mmol/L   Potassium 4.3 3.5 - 5.1 mmol/L   Calcium, Ion 1.19 1.15 - 1.40 mmol/L   HCT 25.0 (L) 39.0 - 52.0 %   Hemoglobin 8.5 (L) 13.0 - 17.0 g/dL   Patient temperature 36.5 C    Sample type ARTERIAL   I-STAT, chem 8     Status: Abnormal   Collection Time: 02/11/2020  3:53 PM  Result Value Ref Range   Sodium 134 (L) 135 - 145 mmol/L   Potassium 4.3 3.5 - 5.1 mmol/L   Chloride 99 98 - 111 mmol/L   BUN 20 6 - 20 mg/dL   Creatinine, Ser 1.50 (H) 0.61 - 1.24 mg/dL   Glucose, Bld 160 (H) 70 - 99 mg/dL    Comment: Glucose reference range applies only to samples taken after fasting for at least 8 hours.   Calcium, Ion 1.20 1.15 - 1.40 mmol/L   TCO2 27 22 - 32 mmol/L   Hemoglobin 7.8 (L) 13.0 - 17.0 g/dL   HCT 23.0 (L) 39.0 - 52.0 %  Prepare RBC (crossmatch)     Status: None   Collection Time: 02/15/2020  3:57 PM  Result Value Ref Range   Order Confirmation      ORDER PROCESSED BY BLOOD BANK Performed at [facility], [place] [address], [place], [place] [address]   I-STAT, [place] Clap 8     Status: Abnormal   Collection Time: 02/23/2020  5:23 PM  Result Value Ref Range   Sodium 133 (L) 135 - 145 mmol/L   Potassium 4.1 3.5 - 5.1 mmol/L   Chloride 100 98 - 111 mmol/L   BUN 22 (H) 6 - 20 mg/dL   Creatinine, Ser 1.50 (H) 0.61 - 1.24 mg/dL   Glucose, Bld 208 (H) 70 - 99 mg/dL    Comment: Glucose reference range applies only to samples taken after fasting for at least 8 hours.   Calcium, Ion 1.20 1.15 - 1.40 mmol/L    TCO2 24 22 - 32 mmol/L   Hemoglobin 8.8 (L) 13.0 - 17.0 g/dL   HCT 26.0 (L) 39.0 - 52.0 %  Glucose, capillary     Status: Abnormal   Collection Time: 02/16/2020  7:45 PM  Result Value Ref Range   Glucose-Capillary 217 (H) 70 - 99 mg/dL    Comment: Glucose reference range applies only to samples taken after fasting for at least 8 hours.  I-STAT 7, (LYTES, BLD GAS, ICA, H+H)     Status: Abnormal   Collection Time: 02/20/2020  8:50 PM  Result Value Ref Range   pH, Arterial 7.374 7.350 - 7.450   pCO2 arterial 40.7 32.0 - 48.0 mmHg   pO2, Arterial 74 (L) 83.0 - 108.0 mmHg   Bicarbonate 23.7 20.0 - 28.0 mmol/L   TCO2 25 22 - 32 mmol/L   O2 Saturation 94.0 %   Acid-base deficit 1.0 0.0 - 2.0 mmol/L   Sodium 134 (L) 135 - 145 mmol/L   Potassium 4.6 3.5 - 5.1 mmol/L   Calcium, Ion 1.14 (L) 1.15 - 1.40 mmol/L   HCT 21.0 (L) 39.0 - 52.0 %   Hemoglobin 7.1 (L) 13.0 - 17.0 g/dL   Patient temperature 98.6 F    Sample type ARTERIAL   Renal function panel (daily at 1600)  Status: Abnormal   Collection Time: 02/10/2020  8:58 PM  Result Value Ref Range   Sodium 131 (L) 135 - 145 mmol/L   Potassium 4.5 3.5 - 5.1 mmol/L   Chloride 101 98 - 111 mmol/L   CO2 22 22 - 32 mmol/L   Glucose, Bld 213 (H) 70 - 99 mg/dL    Comment: Glucose reference range applies only to samples taken after fasting for at least 8 hours.   BUN 23 (H) 6 - 20 mg/dL   Creatinine, Ser 1.62 (H) 0.61 - 1.24 mg/dL   Calcium 7.4 (L) 8.9 - 10.3 mg/dL   Phosphorus 4.5 2.5 - 4.6 mg/dL   Albumin 1.5 (L) 3.5 - 5.0 g/dL   GFR, Estimated 51 (L) >60 mL/min    Comment: (NOTE) Calculated using the CKD-EPI Creatinine Equation (2021)    Anion gap 8 5 - 15    Comment: Performed at [facility] [address], [place], [place] [address]  CBC     Status: Abnormal   Collection Time: 02/22/2020  8:58 PM  Result Value Ref Range   WBC 21.5 (H) 4.0 - 10.5 K/uL   RBC 2.50 (L) 4.22 - 5.81 MIL/uL   Hemoglobin 7.1 (L) 13.0 - 17.0 g/dL    HCT 22.8 (L) 39.0 - 52.0 %   MCV 91.2 80.0 - 100.0 fL   MCH 28.4 26.0 - 34.0 pg   MCHC 31.1 30.0 - 36.0 g/dL   RDW 18.8 (H) 11.5 - 15.5 %   Platelets 274 150 - 400 K/uL   nRBC 0.1 0.0 - 0.2 %    Comment: Performed at [facility] [address], [place], [place] [address]  Protime-INR     Status: Abnormal   Collection Time: 02/20/2020  8:58 PM  Result Value Ref Range   Prothrombin Time 15.9 (H) 11.4 - 15.2 seconds   INR 1.3 (H) 0.8 - 1.2    Comment: (NOTE) INR goal varies based on device and disease states. Performed at [facility], [place] [address], [place], [place] [address]   Fibrinogen     Status: Abnormal   Collection Time: 02/13/2020  8:58 PM  Result Value Ref Range   Fibrinogen 485 (H) 210 - 475 mg/dL    Comment: Performed at [facility] [address], [place], [place] [address]  Lactate dehydrogenase     Status: Abnormal   Collection Time: 02/06/2020  8:58 PM  Result Value Ref Range   LDH 374 (H) 98 - 192 U/L    Comment: Performed at [facility], [place] [address] [place], [place] [address]  Glucose, capillary     Status: Abnormal   Collection Time: 02/19/2020  9:01 PM  Result Value Ref Range   Glucose-Capillary 205 (H) 70 - 99 mg/dL    Comment: Glucose reference range applies only to samples taken after fasting for at least 8 hours.  Glucose, capillary     Status: Abnormal   Collection Time: 02/27/2020 10:13 PM  Result Value Ref Range   Glucose-Capillary 190 (H) 70 - 99 mg/dL    Comment: Glucose reference range applies only to samples taken after fasting for at least 8 hours.  Prepare RBC (crossmatch)     Status: None   Collection Time: 02/17/2020 10:24 PM  Result Value Ref Range   Order Confirmation      ORDER PROCESSED BY BLOOD BANK Performed at [facility], [address],  [place] [address]   Glucose, capillary     Status: Abnormal   Collection Time: 01/30/2020 11:10 PM  Result Value Ref Range   Glucose-Capillary 174 (H) 70 - 99  mg/dL    Comment: Glucose reference range applies only to samples taken after fasting for at least 8 hours.  Glucose, capillary     Status: Abnormal   Collection Time: 02/08/20 12:24 AM  Result Value Ref Range   Glucose-Capillary 172 (H) 70 - 99 mg/dL    Comment: Glucose reference range applies only to samples taken after fasting for at least 8 hours.  Glucose, capillary     Status: Abnormal   Collection Time: 02/08/20 12:59 AM  Result Value Ref Range   Glucose-Capillary 170 (H) 70 - 99 mg/dL    Comment: Glucose reference range applies only to samples taken after fasting for at least 8 hours.  I-STAT 7, (LYTES, BLD GAS, ICA, H+H)     Status: Abnormal   Collection Time: 02/08/20  1:01 AM  Result Value Ref Range   pH, Arterial 7.396 7.350 - 7.450   pCO2 arterial 39.4 32.0 - 48.0 mmHg   pO2, Arterial 68 (L) 83.0 - 108.0 mmHg   Bicarbonate 24.1 20.0 - 28.0 mmol/L   TCO2 25 22 - 32 mmol/L   O2 Saturation 93.0 %   Acid-base deficit 1.0 0.0 - 2.0 mmol/L   Sodium 134 (L) 135 - 145 mmol/L   Potassium 4.6 3.5 - 5.1 mmol/L   Calcium, Ion 1.15 1.15 - 1.40 mmol/L   HCT 23.0 (L) 39.0 - 52.0 %   Hemoglobin 7.8 (L) 13.0 - 17.0 g/dL   Patient temperature 98.5 F    Sample type ARTERIAL   Glucose, capillary     Status: Abnormal   Collection Time: 02/08/20  3:02 AM  Result Value Ref Range   Glucose-Capillary 143 (H) 70 - 99 mg/dL    Comment: Glucose reference range applies only to samples taken after fasting for at least 8 hours.  Glucose, capillary     Status: Abnormal   Collection Time: 02/08/20  3:53 AM  Result Value Ref Range   Glucose-Capillary 138 (H) 70 - 99 mg/dL    Comment: Glucose reference range applies only to samples taken after fasting for at least 8 hours.  I-STAT 7, (LYTES, BLD GAS, ICA, H+H)     Status: Abnormal   Collection Time: 02/08/20  3:55 AM  Result Value Ref Range   pH, Arterial 7.441 7.350 - 7.450   pCO2 arterial 37.0 32.0 - 48.0 mmHg   pO2, Arterial 145 (H) 83.0 -  108.0 mmHg   Bicarbonate 25.2 20.0 - 28.0 mmol/L   TCO2 26 22 - 32 mmol/L   O2 Saturation 99.0 %   Acid-Base Excess 1.0 0.0 - 2.0 mmol/L   Sodium 134 (L) 135 - 145 mmol/L   Potassium 4.7 3.5 - 5.1 mmol/L   Calcium, Ion 1.14 (L) 1.15 - 1.40 mmol/L   HCT 24.0 (L) 39.0 - 52.0 %   Hemoglobin 8.2 (L) 13.0 - 17.0 g/dL   Patient temperature 98.3 F    Sample type ARTERIAL   Lactate dehydrogenase     Status: Abnormal   Collection Time: 02/08/20  4:00 AM  Result Value Ref Range   LDH 392 (H) 98 - 192 U/L    Comment: Performed at [facility], [place] [address] [place], [place] [address]  Renal function panel (daily at 0500)     Status: Abnormal  Collection Time: 02/08/20  4:00 AM  Result Value Ref Range   Sodium 133 (L) 135 - 145 mmol/L   Potassium 4.7 3.5 - 5.1 mmol/L   Chloride 102 98 - 111 mmol/L   CO2 23 22 - 32 mmol/L   Glucose, Bld 137 (H) 70 - 99 mg/dL    Comment: Glucose reference range applies only to samples taken after fasting for at least 8 hours.   BUN 21 (H) 6 - 20 mg/dL   Creatinine, Ser 1.50 (H) 0.61 - 1.24 mg/dL   Calcium 7.8 (L) 8.9 - 10.3 mg/dL   Phosphorus 3.7 2.5 - 4.6 mg/dL   Albumin 1.5 (L) 3.5 - 5.0 g/dL   GFR, Estimated 56 (L) >60 mL/min    Comment: (NOTE) Calculated using the CKD-EPI Creatinine Equation (2021)    Anion gap 8 5 - 15    Comment: Performed at [facility] [address], [place], [place] [address]  Magnesium     Status: None   Collection Time: 02/08/20  4:00 AM  Result Value Ref Range   Magnesium 2.2 1.7 - 2.4 mg/dL    Comment: Performed at [facility] [address] [place], [place] [address]  CBC with Differential/Platelet     Status: Abnormal   Collection Time: 02/08/20  4:00 AM  Result Value Ref Range   WBC 16.1 (H) 4.0 - 10.5 K/uL   RBC 2.74 (L) 4.22 - 5.81 MIL/uL   Hemoglobin 7.9 (L) 13.0 - 17.0 g/dL   HCT 25.0 (L) 39.0 - 52.0 %   MCV 91.2 80.0 - 100.0 fL   MCH 28.8 26.0 - 34.0 pg   MCHC 31.6 30.0 - 36.0 g/dL    RDW 18.4 (H) 11.5 - 15.5 %   Platelets 229 150 - 400 K/uL   nRBC 0.2 0.0 - 0.2 %   Neutrophils Relative % 79 %   Neutro Abs 12.9 (H) 1.7 - 7.7 K/uL   Lymphocytes Relative 6 %   Lymphs Abs 0.9 0.7 - 4.0 K/uL   Monocytes Relative 10 %   Monocytes Absolute 1.6 (H) 0.1 - 1.0 K/uL   Eosinophils Relative 2 %   Eosinophils Absolute 0.3 0.0 - 0.5 K/uL   Basophils Relative 1 %   Basophils Absolute 0.1 0.0 - 0.1 K/uL   Immature Granulocytes 2 %   Abs Immature Granulocytes 0.25 (H) 0.00 - 0.07 K/uL    Comment: Performed at [facility] [address], [place], [place] [address]  Heparin level (unfractionated)     Status: Abnormal   Collection Time: 02/08/20  4:00 AM  Result Value Ref Range   Heparin Unfractionated <0.10 (L) 0.30 - 0.70 IU/mL    Comment: (NOTE) If heparin results are below expected values, and patient dosage has  been confirmed, suggest follow up testing of antithrombin III levels. Performed at [facility], [place] [address], [place], [place] [address]   .Cooxemetry Panel (carboxy, met, total hgb, O2 sat)     Status: Abnormal   Collection Time: 02/08/20  4:08 AM  Result Value Ref Range   Total hemoglobin 7.1 (L) 12.0 - 16.0 g/dL   O2 Saturation 72.3 %   Carboxyhemoglobin 2.0 (H) 0.5 - 1.5 %   Methemoglobin 1.1 0.0 - 1.5 %    Comment: Performed at [facility] [address] [place], [place] [address]  Glucose, capillary     Status: Abnormal   Collection Time: 02/08/20  6:21 AM  Result Value Ref Range   Glucose-Capillary 173 (H) 70 - 99 mg/dL    Comment: Glucose reference range applies only to samples taken after fasting for at least 8 hours.  Glucose, capillary     Status: Abnormal   Collection Time: 02/08/20  7:09 AM  Result Value Ref Range   Glucose-Capillary 160 (H) 70 - 99 mg/dL    Comment: Glucose reference range applies only to samples taken after fasting for at least 8 hours.  Glucose, capillary     Status: Abnormal   Collection Time:  02/08/20  8:11 AM  Result Value Ref Range   Glucose-Capillary 167 (H) 70 - 99 mg/dL    Comment: Glucose reference range applies only to samples taken after fasting for at least 8 hours.  Glucose, capillary     Status: Abnormal   Collection Time: 02/08/20  8:36 AM  Result Value Ref Range   Glucose-Capillary 164 (H) 70 - 99 mg/dL    Comment: Glucose reference range applies only to samples taken after fasting for at least 8 hours.  Glucose, capillary     Status: Abnormal   Collection Time: 02/08/20 10:08 AM  Result Value Ref Range   Glucose-Capillary 190 (H) 70 - 99 mg/dL    Comment: Glucose reference range applies only to samples taken after fasting for at least 8 hours.  Glucose, capillary     Status: Abnormal   Collection Time: 02/08/20 10:59 AM  Result Value Ref Range   Glucose-Capillary 190 (H) 70 - 99 mg/dL    Comment: Glucose reference range applies only to samples taken after fasting for at least 8 hours.  Glucose, capillary     Status: Abnormal   Collection Time: 02/08/20 12:02 PM  Result Value Ref Range   Glucose-Capillary 184 (H) 70 - 99 mg/dL    Comment: Glucose reference range applies only to samples taken after fasting for at least 8 hours.  Glucose, capillary     Status: Abnormal   Collection Time: 02/08/20  1:06 PM  Result Value Ref Range   Glucose-Capillary 172 (H) 70 - 99 mg/dL    Comment: Glucose reference range applies only to samples taken after fasting for at least 8 hours.  Glucose, capillary     Status: Abnormal   Collection Time: 02/08/20  2:00 PM  Result Value Ref Range   Glucose-Capillary 172 (H) 70 - 99 mg/dL    Comment: Glucose reference range applies only to samples taken after fasting for at least 8 hours.  Glucose, capillary     Status: Abnormal   Collection Time: 02/08/20  2:55 PM  Result Value Ref Range   Glucose-Capillary 197 (H) 70 - 99 mg/dL    Comment: Glucose reference range applies only to samples taken after fasting for at least 8 hours.   I-STAT 7, (LYTES, BLD GAS, ICA, H+H)     Status: Abnormal   Collection Time: 02/08/20  2:59 PM  Result Value Ref Range   pH, Arterial 7.442 7.350 - 7.450   pCO2 arterial 36.9 32.0 - 48.0 mmHg   pO2, Arterial 74 (L) 83.0 - 108.0 mmHg   Bicarbonate 25.1 20.0 - 28.0 mmol/L   TCO2 26 22 - 32 mmol/L   O2 Saturation 95.0 %   Acid-Base Excess 1.0 0.0 - 2.0 mmol/L   Sodium 135 135 - 145 mmol/L   Potassium 4.3 3.5 - 5.1 mmol/L   Calcium, Ion 1.14 (L) 1.15 - 1.40 mmol/L   HCT 22.0 (L) 39.0 - 52.0 %  Hemoglobin 7.5 (L) 13.0 - 17.0 g/dL   Collection site art line    Drawn by Nurse    Sample type ARTERIAL   Renal function panel (daily at 1600)     Status: Abnormal   Collection Time: 02/08/20  3:02 PM  Result Value Ref Range   Sodium 134 (L) 135 - 145 mmol/L   Potassium 4.4 3.5 - 5.1 mmol/L   Chloride 102 98 - 111 mmol/L   CO2 24 22 - 32 mmol/L   Glucose, Bld 199 (H) 70 - 99 mg/dL    Comment: Glucose reference range applies only to samples taken after fasting for at least 8 hours.   BUN 21 (H) 6 - 20 mg/dL   Creatinine, Ser 1.45 (H) 0.61 - 1.24 mg/dL   Calcium 7.6 (L) 8.9 - 10.3 mg/dL   Phosphorus 3.3 2.5 - 4.6 mg/dL   Albumin 1.5 (L) 3.5 - 5.0 g/dL   GFR, Estimated 58 (L) >60 mL/min    Comment: (NOTE) Calculated using the CKD-EPI Creatinine Equation (2021)    Anion gap 8 5 - 15    Comment: Performed at [facility] [address] [place], [place] [address]  CBC     Status: Abnormal   Collection Time: 02/08/20  3:02 PM  Result Value Ref Range   WBC 14.3 (H) 4.0 - 10.5 K/uL   RBC 2.46 (L) 4.22 - 5.81 MIL/uL   Hemoglobin 7.3 (L) 13.0 - 17.0 g/dL   HCT 22.0 (L) 39.0 - 52.0 %   MCV 89.4 80.0 - 100.0 fL   MCH 29.7 26.0 - 34.0 pg   MCHC 33.2 30.0 - 36.0 g/dL   RDW 18.9 (H) 11.5 - 15.5 %   Platelets 246 150 - 400 K/uL   nRBC 0.0 0.0 - 0.2 %    Comment: Performed at [facility] [address] [place], [place] [address]  Glucose, capillary     Status: Abnormal    Collection Time: 02/08/20  4:04 PM  Result Value Ref Range   Glucose-Capillary 200 (H) 70 - 99 mg/dL    Comment: Glucose reference range applies only to samples taken after fasting for at least 8 hours.  Glucose, capillary     Status: Abnormal   Collection Time: 02/08/20  4:59 PM  Result Value Ref Range   Glucose-Capillary 190 (H) 70 - 99 mg/dL    Comment: Glucose reference range applies only to samples taken after fasting for at least 8 hours.  Glucose, capillary     Status: Abnormal   Collection Time: 02/08/20  5:58 PM  Result Value Ref Range   Glucose-Capillary 206 (H) 70 - 99 mg/dL    Comment: Glucose reference range applies only to samples taken after fasting for at least 8 hours.  Glucose, capillary     Status: Abnormal   Collection Time: 02/08/20  6:59 PM  Result Value Ref Range   Glucose-Capillary 194 (H) 70 - 99 mg/dL    Comment: Glucose reference range applies only to samples taken after fasting for at least 8 hours.  Prepare RBC (crossmatch)     Status: None   Collection Time: 02/08/20  7:00 PM  Result Value Ref Range   Order Confirmation      ORDER PROCESSED BY BLOOD BANK Performed at [facility], [place] [address], [place], [place] [address]   Glucose, capillary     Status: Abnormal   Collection Time: 02/08/20  8:00 PM  Result Value  Ref Range   Glucose-Capillary 176 (H) 70 - 99 mg/dL    Comment: Glucose reference range applies only to samples taken after fasting for at least 8 hours.  Glucose, capillary     Status: Abnormal   Collection Time: 02/08/20  9:00 PM  Result Value Ref Range   Glucose-Capillary 197 (H) 70 - 99 mg/dL    Comment: Glucose reference range applies only to samples taken after fasting for at least 8 hours.  Glucose, capillary     Status: Abnormal   Collection Time: 02/08/20  9:54 PM  Result Value Ref Range   Glucose-Capillary 188 (H) 70 - 99 mg/dL    Comment: Glucose reference range applies only to samples taken after fasting for at least 8  hours.  Glucose, capillary     Status: Abnormal   Collection Time: 02/08/20 10:57 PM  Result Value Ref Range   Glucose-Capillary 215 (H) 70 - 99 mg/dL    Comment: Glucose reference range applies only to samples taken after fasting for at least 8 hours.  Glucose, capillary     Status: Abnormal   Collection Time: 02/08/20 11:55 PM  Result Value Ref Range   Glucose-Capillary 200 (H) 70 - 99 mg/dL    Comment: Glucose reference range applies only to samples taken after fasting for at least 8 hours.  Glucose, capillary     Status: Abnormal   Collection Time: 02/09/20 12:57 AM  Result Value Ref Range   Glucose-Capillary 179 (H) 70 - 99 mg/dL    Comment: Glucose reference range applies only to samples taken after fasting for at least 8 hours.  Glucose, capillary     Status: Abnormal   Collection Time: 02/09/20  2:04 AM  Result Value Ref Range   Glucose-Capillary 173 (H) 70 - 99 mg/dL    Comment: Glucose reference range applies only to samples taken after fasting for at least 8 hours.  Glucose, capillary     Status: Abnormal   Collection Time: 02/09/20  2:59 AM  Result Value Ref Range   Glucose-Capillary 168 (H) 70 - 99 mg/dL    Comment: Glucose reference range applies only to samples taken after fasting for at least 8 hours.  Glucose, capillary     Status: Abnormal   Collection Time: 02/09/20  3:57 AM  Result Value Ref Range   Glucose-Capillary 166 (H) 70 - 99 mg/dL    Comment: Glucose reference range applies only to samples taken after fasting for at least 8 hours.  Cooxemetry Panel (carboxy, met, total hgb, O2 sat)     Status: Abnormal   Collection Time: 02/09/20  4:30 AM  Result Value Ref Range   Total hemoglobin 9.6 (L) 12.0 - 16.0 g/dL   O2 Saturation 78.3 %   Carboxyhemoglobin 1.8 (H) 0.5 - 1.5 %   Methemoglobin 0.6 0.0 - 1.5 %    Comment: Performed at [facility] [address] [place], [place] [address]  Lactate dehydrogenase     Status: Abnormal   Collection Time:  02/09/20  4:30 AM  Result Value Ref Range   LDH 383 (H) 98 - 192 U/L    Comment: Performed at [facility], [place] [address] [place], [place] [address]  Magnesium     Status: None   Collection Time: 02/09/20  4:30 AM  Result Value Ref Range   Magnesium 1.9 1.7 - 2.4 mg/dL    Comment: Performed at [facility] [address] [place], [place]  [address]  CBC with Differential/Platelet     Status: Abnormal   Collection Time: 02/09/20  4:30 AM  Result Value Ref Range   WBC 14.5 (H) 4.0 - 10.5 K/uL   RBC 2.65 (L) 4.22 - 5.81 MIL/uL   Hemoglobin 7.8 (L) 13.0 - 17.0 g/dL   HCT 23.5 (L) 39.0 - 52.0 %   MCV 88.7 80.0 - 100.0 fL   MCH 29.4 26.0 - 34.0 pg   MCHC 33.2 30.0 - 36.0 g/dL   RDW 19.0 (H) 11.5 - 15.5 %   Platelets 237 150 - 400 K/uL   nRBC 0.1 0.0 - 0.2 %   Neutrophils Relative % 75 %   Neutro Abs 10.9 (H) 1.7 - 7.7 K/uL   Lymphocytes Relative 6 %   Lymphs Abs 0.9 0.7 - 4.0 K/uL   Monocytes Relative 12 %   Monocytes Absolute 1.7 (H) 0.1 - 1.0 K/uL   Eosinophils Relative 5 %   Eosinophils Absolute 0.7 (H) 0.0 - 0.5 K/uL   Basophils Relative 1 %   Basophils Absolute 0.1 0.0 - 0.1 K/uL   Immature Granulocytes 1 %   Abs Immature Granulocytes 0.20 (H) 0.00 - 0.07 K/uL    Comment: Performed at [facility] [address] [place], [place] [address]  Heparin level (unfractionated)     Status: Abnormal   Collection Time: 02/09/20  4:30 AM  Result Value Ref Range   Heparin Unfractionated <0.10 (L) 0.30 - 0.70 IU/mL    Comment: (NOTE) If heparin results are below expected values, and patient dosage has  been confirmed, suggest follow up testing of antithrombin III levels. Performed at [facility], [place] [address] [place], [place] [address]   Renal function panel     Status: Abnormal   Collection Time: 02/09/20  4:30 AM  Result Value Ref Range   Sodium 135 135 - 145 mmol/L   Potassium 3.6 3.5 - 5.1 mmol/L   Chloride 104 98 - 111 mmol/L   CO2 22 22 - 32  mmol/L   Glucose, Bld 238 (H) 70 - 99 mg/dL    Comment: Glucose reference range applies only to samples taken after fasting for at least 8 hours.   BUN 20 6 - 20 mg/dL   Creatinine, Ser 1.20 0.61 - 1.24 mg/dL   Calcium 6.8 (L) 8.9 - 10.3 mg/dL   Phosphorus 2.7 2.5 - 4.6 mg/dL   Albumin 1.4 (L) 3.5 - 5.0 g/dL   GFR, Estimated >60 >60 mL/min    Comment: (NOTE) Calculated using the CKD-EPI Creatinine Equation (2021)    Anion gap 9 5 - 15    Comment: Performed at [facility] [address] [place], [place] [address]  Glucose, capillary     Status: Abnormal   Collection Time: 02/09/20  5:01 AM  Result Value Ref Range   Glucose-Capillary 164 (H) 70 - 99 mg/dL    Comment: Glucose reference range applies only to samples taken after fasting for at least 8 hours.  Glucose, capillary     Status: Abnormal   Collection Time: 02/09/20  7:01 AM  Result Value Ref Range   Glucose-Capillary 153 (H) 70 - 99 mg/dL    Comment: Glucose reference range applies only to samples taken after fasting for at least 8 hours.  Glucose, capillary     Status: Abnormal   Collection Time: 02/09/20  7:58 AM  Result Value Ref Range   Glucose-Capillary 170 (H) 70 - 99 mg/dL  Comment: Glucose reference range applies only to samples taken after fasting for at least 8 hours.  Prepare RBC (crossmatch)     Status: None   Collection Time: 02/09/20  8:35 AM  Result Value Ref Range   Order Confirmation      ORDER PROCESSED BY BLOOD BANK Performed at [facility], [address], [place], [place] [address]   Type and screen [place]     Status: None (Preliminary result)   Collection Time: 02/09/20  9:00 AM  Result Value Ref Range   ABO/RH(D) A POS    Antibody Screen NEG    Sample Expiration 02/12/2020,2359    Unit Number [id number]    Blood Component Type RED CELLS,LR    Unit division 00    Status of Unit ISSUED    Transfusion Status OK TO TRANSFUSE    Crossmatch Result       Compatible Performed at [facility], [place] [address] [place], [place] [address]   Glucose, capillary     Status: Abnormal   Collection Time: 02/09/20  9:03 AM  Result Value Ref Range   Glucose-Capillary 195 (H) 70 - 99 mg/dL    Comment: Glucose reference range applies only to samples taken after fasting for at least 8 hours.  Glucose, capillary     Status: Abnormal   Collection Time: 02/09/20 10:07 AM  Result Value Ref Range   Glucose-Capillary 199 (H) 70 - 99 mg/dL    Comment: Glucose reference range applies only to samples taken after fasting for at least 8 hours.  Glucose, capillary     Status: Abnormal   Collection Time: 02/09/20 10:55 AM  Result Value Ref Range   Glucose-Capillary 220 (H) 70 - 99 mg/dL    Comment: Glucose reference range applies only to samples taken after fasting for at least 8 hours.  Glucose, capillary     Status: Abnormal   Collection Time: 02/09/20 11:58 AM  Result Value Ref Range   Glucose-Capillary 198 (H) 70 - 99 mg/dL    Comment: Glucose reference range applies only to samples taken after fasting for at least 8 hours.  Glucose, capillary     Status: Abnormal   Collection Time: 02/09/20 12:54 PM  Result Value Ref Range   Glucose-Capillary 177 (H) 70 - 99 mg/dL    Comment: Glucose reference range applies only to samples taken after fasting for at least 8 hours.  Glucose, capillary     Status: Abnormal   Collection Time: 02/09/20  2:00 PM  Result Value Ref Range   Glucose-Capillary 168 (H) 70 - 99 mg/dL    Comment: Glucose reference range applies only to samples taken after fasting for at least 8 hours.    DG CHEST PORT 1 VIEW  Result Date: 02/08/2020 CLINICAL DATA:  ETT, chest tube, open sternum EXAM: PORTABLE CHEST 1 VIEW COMPARISON:  Numerous priors, most recent 02/16/2020 FINDINGS: *Tracheostomy tube terminates 5 cm from the carina. *Dual lumen right IJ approach central venous catheter tip terminates at the superior cavoatrial junction.  *Right upper extremity approach Impella device remains in stable position. *Left chest wall pacer/AICD with tip at the cardiac apex. *Mediastinal and bilateral pleural drains remain in place. *Pacer pads and telemetry leads overlie the chest. Stable cardiomegaly. Persistent hazy opacities throughout the lungs accounting for differences in technique. Stable bilateral effusions as well as a more lobular opacity towards the right lung apex. No pneumothorax. No new acute osseous or soft tissue  abnormalities. IMPRESSION: Lines and tubes as above. Persistent hazy opacities throughout the lungs compatible with edema. Stable bilateral effusions as well as a more indeterminate lobular opacity towards the right lung apex. Electronically Signed   By: Lovena Le M.D.   On: 02/08/2020 06:56   DG Chest Port 1 View  Result Date: 02/21/2020 CLINICAL DATA:  50 year old male with tracheostomy. EXAM: PORTABLE CHEST 1 VIEW COMPARISON:  Chest radiograph dated 02/20/2020. FINDINGS: Interval removal of the endotracheal tube and placement of a tracheostomy with tip approximately 5.2 cm above the carina. There has been interval removal of feeding tube. The remainder of the support apparatus in similar position. No significant interval change in cardiomegaly and bilateral interstitial prominence and edema. Right upper lobe extrapleural loculated effusion or hematoma similar or slightly increased. No pneumothorax. IMPRESSION: 1. Interval placement of a tracheostomy with tip above the carina. 2. Slight interval increase in the size of the right upper lobe loculated pleural collection. No other significant interval change in the appearance of the lungs. Electronically Signed   By: Anner Crete M.D.   On: 02/09/2020 19:02    Review of Systems  Unable to perform ROS: Intubated   Blood pressure 105/73, pulse 80, temperature 98.9 F (37.2 C), temperature source Axillary, resp. rate (!) 24, height 6' 3" (1.905 m), weight 103.8 kg,  SpO2 98 %. Physical Exam Vitals and nursing note reviewed.  Cardiovascular:     Rate and Rhythm: Normal rate.     Pulses: Normal pulses.  Pulmonary:     Comments: Trach in place and on vent. Chest:     Chest wall: Deformity present.    Abdominal:     General: Abdomen is flat.  Musculoskeletal:        General: Swelling present.  Skin:    General: Skin is warm.     Coloration: Skin is not jaundiced.  Psychiatric:     Comments: sedated     Assessment/Plan: Sternal wound -I will discuss this with Dr. [name].  The patient will likely need a muscle flap to the sternum to help with closure.  Recommend continuation of the [facility] for now.  Follow-up will be with risks of complications due to his anticoagulation.  His overall medical condition is very serious and his kidney failure will also work against healing.  We will plan on having a in-depth conversation with the family to understand their wishes as well.  [place] 02/09/2020, 3:20 PM

## 2020-02-09 NOTE — Progress Notes (Signed)
[facility] BRIEF SICU PROGRESS NOTE  2 Days Post-Op  S/P Procedure(s) (LRB): STERNAL WASHOUT WITH WOUND VAC CHANGE (N/A) GREATER OMENTAL FLAP CLOSURE OF STERNUM (N/A) OPEN TRACHEOSTOMY USING #8 SHILEY (N/A) OPEN INSERTION OF 16 FR. BOLUS GASTROSTOMY TUBE (N/A) 16 FR JEJUNOSTOMY INSERTION  (N/A) TRANSESOPHAGEAL ECHOCARDIOGRAM (TEE) (N/A)   Reasonably stable day Some ventricular ectopy this afternoon w/out any sustained VT Impella flows and BP reasonably stable O2 sats 98% on 50% FiO2 Tolerating CVVHD pulling some volume off  Plan: Continue current plan  Rexene Alberts, MD 02/09/2020 5:22 PM

## 2020-02-09 NOTE — Consult Note (Signed)
Reason for Consult: Sternal wound Referring Physician: Dr. Murvin Natal  Corey Palmer is an 50 y.o. male.  HPI: The patient is a 50 year old male in the hospital for treatment of multiple conditions primarily heart failure and cardiogenic shock.  He was admitted the beginning of December.  On 12/11 he was taken to the operating room by Dr. Julien Girt and underwent placement of an LVAD.  On 12/12 he was taken back to the OR for repositioning of the LVAD due to displacement.  On 12/15 he was taken back to the OR for a redo median sternotomy with coronary artery bypass grafting x4.  The left internal mammary artery was used, a saphenous vein, a right thigh and lower leg vein.  The following day his chest was open urgently with concerns for cardiac tamponade.  On 12/23 the patient was taken back to the OR for closure of the sternal wound.  On 12/28 the patient was returned to the operating room for sternal washout and VAC placement.  Another attempt for sternal closure was made on 1/5.  The patient was returned to the operating room on 1/10 for a sternal washout, a greater omental flap and VAC placement. He is now in respiratory and renal failure.  He is on the vent and receiving hemodialysis.  The VAC is in place and it appears that he has a large sternal wound.  His wife, [name], is at the bedside.  His past medical history as listed below.   Past Medical History:  Diagnosis Date  . Anginal pain ([place])   . Anxiety   . Arthritis   . Automatic implantable cardioverter-defibrillator in situ 2007   [employer]   . Bipolar disorder ([place])   . CAD (coronary artery disease)    Cardiac cath (08/19/2000) - Nonobstructive, 40% stenosis of LAD,.   . Cancer [facility]) 2013   benign stomach cancer  . CHF (congestive heart failure) ([place])   . Chondrocalcinosis of right knee 06/11/2013  . Chronic systolic heart failure ([place])    2D echo (16/1096) - Systolic function severely reduced., LV EF 20-25%. Akinesis of apical and  anteroseptal mycoardium.  last 2D echo (11/2008 ), LV EF 25-30%, diffuse hypokinesis, and grade 1 diastolic dysfunction.  . Depression   . Diabetes uncomplicated adult-type II    on insulin therapy, a1c 12.9 in 01/2011  . Dilated cardiomyopathy ([place])    status post ICD placement in 2008 c/b tearing at the atrial junction resulting in tamponade and urgent thoracotomy  . Dyslipidemia   . Dysrhythmia   . GERD (gastroesophageal reflux disease)   . Gout, unspecified   . Hepatic steatosis 2011   seen on ultrasound.   . History of kidney stones   . Hx of echocardiogram    Echo (04/2013):  Mild LVH, EF 20-25%, mild LAE, mild to mod RVE with mild reduced RVSF.  Mar[place] Kitchen Hypertension   . Obesity (BMI 30.0-34.9)   . Pacemaker   . Paroxysmal atrial fibrillation (H[facility])    on chronic coumadin, goal INR 2-3. status post direct current  . Peptic ulcer   . Shortness of breath   . Sleep apnea    does not have CPAP    Past Surgical History:  Procedure Laterality Date  . APPLICATION OF WOUND VAC N/A 01/18/2020   Procedure: APPLICATION OF WOUND VAC;  Surgeon: Prescott Gum, Collier Salina, MD;  Location: [place];  Service: Thoracic;  Laterality: N/A;  . APPLICATION OF WOUND VAC N/A 01/24/2020   Procedure: APPLICATION OF  WOUND VAC;  Surgeon: Atkins, Broadus Z, MD;  [facility];  Service: Thoracic;  Laterality: N/A;  . APPLICATION OF WOUND VAC N/A 02/13/2020   Procedure: WOUND VAC CHANGE;  Surgeon: Atkins, Broadus Z, MD;  [facility];  Service: Thoracic;  Laterality: N/A;  . BEDSIDE EVACUATION OF HEMATOMA N/A 01/14/2020   Procedure: CHEST EXPLORATION @2H12;  Surgeon: Van Trigt, Peter, MD;  [facility];  Service: Cardiovascular;  Laterality: N/A;  . BEDSIDE EVACUATION OF HEMATOMA N/A 01/15/2020   Procedure: CHEST EXPLORATION @2H12;  Surgeon: Atkins, Broadus Z, MD;  [facility];  Service: Open Heart Surgery;  Laterality: N/A;  . BIOPSY  12/01/2019   Procedure: BIOPSY;  Surgeon: Outlaw, William, MD;  [facility];  Service: Endoscopy;;  . CARDIAC CATHETERIZATION    . CARDIAC PACEMAKER PLACEMENT    . CHOLECYSTECTOMY    . COLONOSCOPY    . CORONARY ARTERY BYPASS GRAFT    . CORONARY ARTERY BYPASS GRAFT N/A 01/04/2020   Procedure: CORONARY ARTERY BYPASS GRAFTING (CABG), ON PUMP, TIMES FOUR, USING LEFT INTERNAL MAMMARY ARTERY AND ENDOSCOPICALLY HARVESTED RIGHT GREATER SAPHENOUS VEIN;  Surgeon: Atkins, Broadus Z, MD;  [facility];  Service: Open Heart Surgery;  Laterality: N/A;  . ESOPHAGOGASTRODUODENOSCOPY  04/05/2011   Procedure: ESOPHAGOGASTRODUODENOSCOPY (EGD);  Surgeon: John N Perry, MD;  [facility];  Service: Endoscopy;  Laterality: N/A;  . ESOPHAGOGASTRODUODENOSCOPY (EGD) WITH PROPOFOL N/A 12/01/2019   Procedure: ESOPHAGOGASTRODUODENOSCOPY (EGD) WITH PROPOFOL;  Surgeon: Outlaw, William, MD;  [facility];  Service: Endoscopy;  Laterality: N/A;  . EXPLORATION POST OPERATIVE OPEN HEART N/A 01/26/2020   Procedure: RE-EXPLORATION POST OPERATIVE OPEN HEART;  Surgeon: Atkins, Broadus Z, MD;  [facility];  Service: Open Heart Surgery;  Laterality: N/A;  . GASTROSTOMY N/A 01/31/2020   Procedure: OPEN INSERTION OF 16 FR. BOLUS GASTROSTOMY TUBE;  Surgeon: Atkins, Broadus Z, MD;  [facility];  Service: Thoracic;  Laterality: N/A;  . GREATER OMENTAL FLAP CLOSURE N/A 01/29/2020   Procedure: GREATER OMENTAL FLAP CLOSURE OF STERNUM;  Surgeon: Atkins, Broadus Z, MD;  [facility];  Service: Thoracic;  Laterality: N/A;  . JEJUNOSTOMY N/A 02/27/2020   Procedure: 16 FR JEJUNOSTOMY INSERTION ;  Surgeon: Atkins, Broadus Z, MD;  [facility];  Service: Thoracic;  Laterality: N/A;  . JOINT REPLACEMENT     left knee replacement  . KNEE ARTHROSCOPY Right 06/11/2013   Procedure: RIGHT KNEE ARTHROSCOPY with chondroplasty;  Surgeon: Joshua P Landau, MD;  [facility];  Service: Orthopedics;  Laterality: Right;  . PLACEMENT OF IMPELLA LEFT VENTRICULAR ASSIST DEVICE N/A 01/21/2020    Procedure: PLACEMENT OF IMPELLA LEFT VENTRICULAR ASSIST DEVICE;  Surgeon: Atkins, Broadus Z, MD;  [facility];  Service: Open Heart Surgery;  Laterality: N/A;  . PLACEMENT OF IMPELLA LEFT VENTRICULAR ASSIST DEVICE Right 01/14/2020   Procedure: AXILLARY  ARTERY PLACEMENT OF ABIOMED IMPELLA  5.5 LEFT VENTRICULAR ASSIST DEVICE;  Surgeon: Atkins, Broadus Z, MD;  [facility];  Service: Open Heart Surgery;  Laterality: Right;  . RIGHT/LEFT HEART CATH AND CORONARY ANGIOGRAPHY N/A 01/23/2020   Procedure: RIGHT/LEFT HEART CATH AND CORONARY ANGIOGRAPHY;  Surgeon: McLean, Dalton S, MD;  [facility];  Service: Cardiovascular;  Laterality: N/A;  . STERNAL CLOSURE N/A 01/27/2020   Procedure: STERNAL CLOSURE;  Surgeon: Van Trigt, Peter, MD;  [facility];  Service: Thoracic;  Laterality: N/A;  . STERNAL CLOSURE N/A 02/23/2020   Procedure: STERNAL CLOSURE;    Surgeon: Atkins, Broadus Z, MD;  [facility];  Service: Thoracic;  Laterality: N/A;  . STERNAL WOUND DEBRIDEMENT N/A 01/01/2020   Procedure: STERNAL WASH OUT;  Surgeon: Atkins, Broadus Z, MD;  [facility];  Service: Thoracic;  Laterality: N/A;  . STERNAL WOUND DEBRIDEMENT N/A 02/23/2020   Procedure: STERNAL WASHOUT;  Surgeon: Atkins, Broadus Z, MD;  [facility];  Service: Thoracic;  Laterality: N/A;  . STERNAL WOUND DEBRIDEMENT N/A 02/01/2020   Procedure: STERNAL WASHOUT WITH WOUND VAC CHANGE;  Surgeon: Atkins, Broadus Z, MD;  [facility];  Service: Thoracic;  Laterality: N/A;  . TEE WITHOUT CARDIOVERSION N/A 01/21/2020   Procedure: TRANSESOPHAGEAL ECHOCARDIOGRAM (TEE);  Surgeon: Atkins, Broadus Z, MD;  [facility];  Service: Open Heart Surgery;  Laterality: N/A;  . TEE WITHOUT CARDIOVERSION N/A 12/30/2019   Procedure: TRANSESOPHAGEAL ECHOCARDIOGRAM (TEE);  Surgeon: Atkins, Broadus Z, MD;  [facility];  Service: Open Heart Surgery;  Laterality: N/A;  . TEE WITHOUT CARDIOVERSION N/A 01/03/2020   Procedure:  TRANSESOPHAGEAL ECHOCARDIOGRAM (TEE);  Surgeon: Van Trigt, Peter, MD;  [facility];  Service: Thoracic;  Laterality: N/A;  . TEE WITHOUT CARDIOVERSION N/A 02/04/2020   Procedure: TRANSESOPHAGEAL ECHOCARDIOGRAM (TEE);  Surgeon: Atkins, Broadus Z, MD;  [facility];  Service: Thoracic;  Laterality: N/A;  . TEE WITHOUT CARDIOVERSION N/A 02/22/2020   Procedure: TRANSESOPHAGEAL ECHOCARDIOGRAM (TEE);  Surgeon: Atkins, Broadus Z, MD;  [facility];  Service: Thoracic;  Laterality: N/A;  . TOTAL KNEE ARTHROPLASTY Left   . TRACHEOSTOMY TUBE PLACEMENT N/A 02/23/2020   Procedure: OPEN TRACHEOSTOMY USING #8 SHILEY;  Surgeon: Atkins, Broadus Z, MD;  [facility];  Service: Thoracic;  Laterality: N/A;    Family History  Problem Relation Age of Onset  . Diabetes Mother   . Coronary artery disease Mother 63  . Gout Mother   . Heart disease Mother   . Heart attack Mother 63  . Diabetes Father   . Coronary artery disease Father 63       Died in a house fire   . Heart disease Father   . Heart disease Brother     Social History:  reports that he has never smoked. He has never used smokeless tobacco. He reports that he does not drink alcohol and does not use drugs.  Allergies:  Allergies  Allergen Reactions  . Orange Fruit Anaphylaxis, Hives and Other (See Comments)    Per Pt- Blisters around lips and Hives all over, also  . Penicillins Anaphylaxis    Did it involve swelling of the face/tongue/throat, SOB, or low BP? Yes Did it involve sudden or severe rash/hives, skin peeling, or any reaction on the inside of your mouth or nose? Yes Did you need to seek medical attention at a hospital or doctor's office? No When did it last happen?childhood If all above answers are "NO", may proceed with cephalosporin use.  . Jardiance [Empagliflozin] Itching  . Basaglar Kwikpen [Insulin Glargine] Nausea And Vomiting    Medications: I have reviewed the patient's current medications.  Results for  orders placed or performed during the hospital encounter of 01/15/2020 (from the past 48 hour(s))  I-STAT 7, (LYTES, BLD GAS, ICA, H+H)     Status: Abnormal   Collection Time: 02/16/2020  3:49 PM  Result Value Ref Range   pH, Arterial 7.323 (L) 7.350 - 7.450   pCO2 arterial 50.9 (H) 32.0 - 48.0 mmHg   pO2, Arterial 78 (L) 83.0 - 108.0 mmHg     Bicarbonate 26.6 20.0 - 28.0 mmol/L   TCO2 28 22 - 32 mmol/L   O2 Saturation 95.0 %   Acid-Base Excess 0.0 0.0 - 2.0 mmol/L   Sodium 135 135 - 145 mmol/L   Potassium 4.3 3.5 - 5.1 mmol/L   Calcium, Ion 1.19 1.15 - 1.40 mmol/L   HCT 25.0 (L) 39.0 - 52.0 %   Hemoglobin 8.5 (L) 13.0 - 17.0 g/dL   Patient temperature 36.5 C    Sample type ARTERIAL   I-STAT, chem 8     Status: Abnormal   Collection Time: 02/05/2020  3:53 PM  Result Value Ref Range   Sodium 134 (L) 135 - 145 mmol/L   Potassium 4.3 3.5 - 5.1 mmol/L   Chloride 99 98 - 111 mmol/L   BUN 20 6 - 20 mg/dL   Creatinine, Ser 1.50 (H) 0.61 - 1.24 mg/dL   Glucose, Bld 160 (H) 70 - 99 mg/dL    Comment: Glucose reference range applies only to samples taken after fasting for at least 8 hours.   Calcium, Ion 1.20 1.15 - 1.40 mmol/L   TCO2 27 22 - 32 mmol/L   Hemoglobin 7.8 (L) 13.0 - 17.0 g/dL   HCT 23.0 (L) 39.0 - 52.0 %  Prepare RBC (crossmatch)     Status: None   Collection Time: 02/28/2020  3:57 PM  Result Value Ref Range   Order Confirmation      ORDER PROCESSED BY BLOOD BANK Performed at [facility], [place] [address], [place], [place] [address]   I-STAT, [place] Clap 8     Status: Abnormal   Collection Time: 02/22/2020  5:23 PM  Result Value Ref Range   Sodium 133 (L) 135 - 145 mmol/L   Potassium 4.1 3.5 - 5.1 mmol/L   Chloride 100 98 - 111 mmol/L   BUN 22 (H) 6 - 20 mg/dL   Creatinine, Ser 1.50 (H) 0.61 - 1.24 mg/dL   Glucose, Bld 208 (H) 70 - 99 mg/dL    Comment: Glucose reference range applies only to samples taken after fasting for at least 8 hours.   Calcium, Ion 1.20 1.15 - 1.40 mmol/L    TCO2 24 22 - 32 mmol/L   Hemoglobin 8.8 (L) 13.0 - 17.0 g/dL   HCT 26.0 (L) 39.0 - 52.0 %  Glucose, capillary     Status: Abnormal   Collection Time: 01/29/2020  7:45 PM  Result Value Ref Range   Glucose-Capillary 217 (H) 70 - 99 mg/dL    Comment: Glucose reference range applies only to samples taken after fasting for at least 8 hours.  I-STAT 7, (LYTES, BLD GAS, ICA, H+H)     Status: Abnormal   Collection Time: 02/21/2020  8:50 PM  Result Value Ref Range   pH, Arterial 7.374 7.350 - 7.450   pCO2 arterial 40.7 32.0 - 48.0 mmHg   pO2, Arterial 74 (L) 83.0 - 108.0 mmHg   Bicarbonate 23.7 20.0 - 28.0 mmol/L   TCO2 25 22 - 32 mmol/L   O2 Saturation 94.0 %   Acid-base deficit 1.0 0.0 - 2.0 mmol/L   Sodium 134 (L) 135 - 145 mmol/L   Potassium 4.6 3.5 - 5.1 mmol/L   Calcium, Ion 1.14 (L) 1.15 - 1.40 mmol/L   HCT 21.0 (L) 39.0 - 52.0 %   Hemoglobin 7.1 (L) 13.0 - 17.0 g/dL   Patient temperature 98.6 F    Sample type ARTERIAL   Renal function panel (daily at 1600)  Status: Abnormal   Collection Time: 01/30/2020  8:58 PM  Result Value Ref Range   Sodium 131 (L) 135 - 145 mmol/L   Potassium 4.5 3.5 - 5.1 mmol/L   Chloride 101 98 - 111 mmol/L   CO2 22 22 - 32 mmol/L   Glucose, Bld 213 (H) 70 - 99 mg/dL    Comment: Glucose reference range applies only to samples taken after fasting for at least 8 hours.   BUN 23 (H) 6 - 20 mg/dL   Creatinine, Ser 1.62 (H) 0.61 - 1.24 mg/dL   Calcium 7.4 (L) 8.9 - 10.3 mg/dL   Phosphorus 4.5 2.5 - 4.6 mg/dL   Albumin 1.5 (L) 3.5 - 5.0 g/dL   GFR, Estimated 51 (L) >60 mL/min    Comment: (NOTE) Calculated using the CKD-EPI Creatinine Equation (2021)    Anion gap 8 5 - 15    Comment: Performed at [facility], [address] [place], [place] [address]  CBC     Status: Abnormal   Collection Time: 02/23/2020  8:58 PM  Result Value Ref Range   WBC 21.5 (H) 4.0 - 10.5 K/uL   RBC 2.50 (L) 4.22 - 5.81 MIL/uL   Hemoglobin 7.1 (L) 13.0 - 17.0 g/dL    HCT 22.8 (L) 39.0 - 52.0 %   MCV 91.2 80.0 - 100.0 fL   MCH 28.4 26.0 - 34.0 pg   MCHC 31.1 30.0 - 36.0 g/dL   RDW 18.8 (H) 11.5 - 15.5 %   Platelets 274 150 - 400 K/uL   nRBC 0.1 0.0 - 0.2 %    Comment: Performed at [facility], [address] [place], [place] [address]  Protime-INR     Status: Abnormal   Collection Time: 02/10/2020  8:58 PM  Result Value Ref Range   Prothrombin Time 15.9 (H) 11.4 - 15.2 seconds   INR 1.3 (H) 0.8 - 1.2    Comment: (NOTE) INR goal varies based on device and disease states. Performed at [facility], [address] [place], [place] [address]   Fibrinogen     Status: Abnormal   Collection Time: 02/12/2020  8:58 PM  Result Value Ref Range   Fibrinogen 485 (H) 210 - 475 mg/dL    Comment: Performed at [facility], [address] [place], [place] [address]  Lactate dehydrogenase     Status: Abnormal   Collection Time: 02/15/2020  8:58 PM  Result Value Ref Range   LDH 374 (H) 98 - 192 U/L    Comment: Performed at [facility], [address] [place], [place] [address]  Glucose, capillary     Status: Abnormal   Collection Time: 02/23/2020  9:01 PM  Result Value Ref Range   Glucose-Capillary 205 (H) 70 - 99 mg/dL    Comment: Glucose reference range applies only to samples taken after fasting for at least 8 hours.  Glucose, capillary     Status: Abnormal   Collection Time: 02/10/2020 10:13 PM  Result Value Ref Range   Glucose-Capillary 190 (H) 70 - 99 mg/dL    Comment: Glucose reference range applies only to samples taken after fasting for at least 8 hours.  Prepare RBC (crossmatch)     Status: None   Collection Time: 02/06/2020 10:24 PM  Result Value Ref Range   Order Confirmation      ORDER PROCESSED BY BLOOD BANK Performed at  Hospital [facility], [address] [place],   [place] [address]   Glucose, capillary     Status: Abnormal   Collection Time: 02/23/2020 11:10 PM  Result Value Ref Range   Glucose-Capillary 174 (H) 70 - 99  mg/dL    Comment: Glucose reference range applies only to samples taken after fasting for at least 8 hours.  Glucose, capillary     Status: Abnormal   Collection Time: 02/08/20 12:24 AM  Result Value Ref Range   Glucose-Capillary 172 (H) 70 - 99 mg/dL    Comment: Glucose reference range applies only to samples taken after fasting for at least 8 hours.  Glucose, capillary     Status: Abnormal   Collection Time: 02/08/20 12:59 AM  Result Value Ref Range   Glucose-Capillary 170 (H) 70 - 99 mg/dL    Comment: Glucose reference range applies only to samples taken after fasting for at least 8 hours.  I-STAT 7, (LYTES, BLD GAS, ICA, H+H)     Status: Abnormal   Collection Time: 02/08/20  1:01 AM  Result Value Ref Range   pH, Arterial 7.396 7.350 - 7.450   pCO2 arterial 39.4 32.0 - 48.0 mmHg   pO2, Arterial 68 (L) 83.0 - 108.0 mmHg   Bicarbonate 24.1 20.0 - 28.0 mmol/L   TCO2 25 22 - 32 mmol/L   O2 Saturation 93.0 %   Acid-base deficit 1.0 0.0 - 2.0 mmol/L   Sodium 134 (L) 135 - 145 mmol/L   Potassium 4.6 3.5 - 5.1 mmol/L   Calcium, Ion 1.15 1.15 - 1.40 mmol/L   HCT 23.0 (L) 39.0 - 52.0 %   Hemoglobin 7.8 (L) 13.0 - 17.0 g/dL   Patient temperature 98.5 F    Sample type ARTERIAL   Glucose, capillary     Status: Abnormal   Collection Time: 02/08/20  3:02 AM  Result Value Ref Range   Glucose-Capillary 143 (H) 70 - 99 mg/dL    Comment: Glucose reference range applies only to samples taken after fasting for at least 8 hours.  Glucose, capillary     Status: Abnormal   Collection Time: 02/08/20  3:53 AM  Result Value Ref Range   Glucose-Capillary 138 (H) 70 - 99 mg/dL    Comment: Glucose reference range applies only to samples taken after fasting for at least 8 hours.  I-STAT 7, (LYTES, BLD GAS, ICA, H+H)     Status: Abnormal   Collection Time: 02/08/20  3:55 AM  Result Value Ref Range   pH, Arterial 7.441 7.350 - 7.450   pCO2 arterial 37.0 32.0 - 48.0 mmHg   pO2, Arterial 145 (H) 83.0 -  108.0 mmHg   Bicarbonate 25.2 20.0 - 28.0 mmol/L   TCO2 26 22 - 32 mmol/L   O2 Saturation 99.0 %   Acid-Base Excess 1.0 0.0 - 2.0 mmol/L   Sodium 134 (L) 135 - 145 mmol/L   Potassium 4.7 3.5 - 5.1 mmol/L   Calcium, Ion 1.14 (L) 1.15 - 1.40 mmol/L   HCT 24.0 (L) 39.0 - 52.0 %   Hemoglobin 8.2 (L) 13.0 - 17.0 g/dL   Patient temperature 98.3 F    Sample type ARTERIAL   Lactate dehydrogenase     Status: Abnormal   Collection Time: 02/08/20  4:00 AM  Result Value Ref Range   LDH 392 (H) 98 - 192 U/L    Comment: Performed at [facility], [place] [address] [place], [place] [address]  Renal function panel (daily at 0500)     Status: Abnormal  Collection Time: 02/08/20  4:00 AM  Result Value Ref Range   Sodium 133 (L) 135 - 145 mmol/L   Potassium 4.7 3.5 - 5.1 mmol/L   Chloride 102 98 - 111 mmol/L   CO2 23 22 - 32 mmol/L   Glucose, Bld 137 (H) 70 - 99 mg/dL    Comment: Glucose reference range applies only to samples taken after fasting for at least 8 hours.   BUN 21 (H) 6 - 20 mg/dL   Creatinine, Ser 1.50 (H) 0.61 - 1.24 mg/dL   Calcium 7.8 (L) 8.9 - 10.3 mg/dL   Phosphorus 3.7 2.5 - 4.6 mg/dL   Albumin 1.5 (L) 3.5 - 5.0 g/dL   GFR, Estimated 56 (L) >60 mL/min    Comment: (NOTE) Calculated using the CKD-EPI Creatinine Equation (2021)    Anion gap 8 5 - 15    Comment: Performed at [facility] [address], [place], [place] [address]  Magnesium     Status: None   Collection Time: 02/08/20  4:00 AM  Result Value Ref Range   Magnesium 2.2 1.7 - 2.4 mg/dL    Comment: Performed at [facility] [address] [place], [place] [address]  CBC with Differential/Platelet     Status: Abnormal   Collection Time: 02/08/20  4:00 AM  Result Value Ref Range   WBC 16.1 (H) 4.0 - 10.5 K/uL   RBC 2.74 (L) 4.22 - 5.81 MIL/uL   Hemoglobin 7.9 (L) 13.0 - 17.0 g/dL   HCT 25.0 (L) 39.0 - 52.0 %   MCV 91.2 80.0 - 100.0 fL   MCH 28.8 26.0 - 34.0 pg   MCHC 31.6 30.0 - 36.0 g/dL    RDW 18.4 (H) 11.5 - 15.5 %   Platelets 229 150 - 400 K/uL   nRBC 0.2 0.0 - 0.2 %   Neutrophils Relative % 79 %   Neutro Abs 12.9 (H) 1.7 - 7.7 K/uL   Lymphocytes Relative 6 %   Lymphs Abs 0.9 0.7 - 4.0 K/uL   Monocytes Relative 10 %   Monocytes Absolute 1.6 (H) 0.1 - 1.0 K/uL   Eosinophils Relative 2 %   Eosinophils Absolute 0.3 0.0 - 0.5 K/uL   Basophils Relative 1 %   Basophils Absolute 0.1 0.0 - 0.1 K/uL   Immature Granulocytes 2 %   Abs Immature Granulocytes 0.25 (H) 0.00 - 0.07 K/uL    Comment: Performed at [facility] [address], [place], [place] [address]  Heparin level (unfractionated)     Status: Abnormal   Collection Time: 02/08/20  4:00 AM  Result Value Ref Range   Heparin Unfractionated <0.10 (L) 0.30 - 0.70 IU/mL    Comment: (NOTE) If heparin results are below expected values, and patient dosage has  been confirmed, suggest follow up testing of antithrombin III levels. Performed at [facility], [place] [address], [place], [place] [address]   .Cooxemetry Panel (carboxy, met, total hgb, O2 sat)     Status: Abnormal   Collection Time: 02/08/20  4:08 AM  Result Value Ref Range   Total hemoglobin 7.1 (L) 12.0 - 16.0 g/dL   O2 Saturation 72.3 %   Carboxyhemoglobin 2.0 (H) 0.5 - 1.5 %   Methemoglobin 1.1 0.0 - 1.5 %    Comment: Performed at [facility] [address] [place], [place] [address]  Glucose, capillary     Status: Abnormal   Collection Time: 02/08/20  6:21 AM  Result Value Ref Range   Glucose-Capillary 173 (H) 70 - 99 mg/dL    Comment: Glucose reference range applies only to samples taken after fasting for at least 8 hours.  Glucose, capillary     Status: Abnormal   Collection Time: 02/08/20  7:09 AM  Result Value Ref Range   Glucose-Capillary 160 (H) 70 - 99 mg/dL    Comment: Glucose reference range applies only to samples taken after fasting for at least 8 hours.  Glucose, capillary     Status: Abnormal   Collection Time:  02/08/20  8:11 AM  Result Value Ref Range   Glucose-Capillary 167 (H) 70 - 99 mg/dL    Comment: Glucose reference range applies only to samples taken after fasting for at least 8 hours.  Glucose, capillary     Status: Abnormal   Collection Time: 02/08/20  8:36 AM  Result Value Ref Range   Glucose-Capillary 164 (H) 70 - 99 mg/dL    Comment: Glucose reference range applies only to samples taken after fasting for at least 8 hours.  Glucose, capillary     Status: Abnormal   Collection Time: 02/08/20 10:08 AM  Result Value Ref Range   Glucose-Capillary 190 (H) 70 - 99 mg/dL    Comment: Glucose reference range applies only to samples taken after fasting for at least 8 hours.  Glucose, capillary     Status: Abnormal   Collection Time: 02/08/20 10:59 AM  Result Value Ref Range   Glucose-Capillary 190 (H) 70 - 99 mg/dL    Comment: Glucose reference range applies only to samples taken after fasting for at least 8 hours.  Glucose, capillary     Status: Abnormal   Collection Time: 02/08/20 12:02 PM  Result Value Ref Range   Glucose-Capillary 184 (H) 70 - 99 mg/dL    Comment: Glucose reference range applies only to samples taken after fasting for at least 8 hours.  Glucose, capillary     Status: Abnormal   Collection Time: 02/08/20  1:06 PM  Result Value Ref Range   Glucose-Capillary 172 (H) 70 - 99 mg/dL    Comment: Glucose reference range applies only to samples taken after fasting for at least 8 hours.  Glucose, capillary     Status: Abnormal   Collection Time: 02/08/20  2:00 PM  Result Value Ref Range   Glucose-Capillary 172 (H) 70 - 99 mg/dL    Comment: Glucose reference range applies only to samples taken after fasting for at least 8 hours.  Glucose, capillary     Status: Abnormal   Collection Time: 02/08/20  2:55 PM  Result Value Ref Range   Glucose-Capillary 197 (H) 70 - 99 mg/dL    Comment: Glucose reference range applies only to samples taken after fasting for at least 8 hours.   I-STAT 7, (LYTES, BLD GAS, ICA, H+H)     Status: Abnormal   Collection Time: 02/08/20  2:59 PM  Result Value Ref Range   pH, Arterial 7.442 7.350 - 7.450   pCO2 arterial 36.9 32.0 - 48.0 mmHg   pO2, Arterial 74 (L) 83.0 - 108.0 mmHg   Bicarbonate 25.1 20.0 - 28.0 mmol/L   TCO2 26 22 - 32 mmol/L   O2 Saturation 95.0 %   Acid-Base Excess 1.0 0.0 - 2.0 mmol/L   Sodium 135 135 - 145 mmol/L   Potassium 4.3 3.5 - 5.1 mmol/L   Calcium, Ion 1.14 (L) 1.15 - 1.40 mmol/L   HCT 22.0 (L) 39.0 - 52.0 %     Hemoglobin 7.5 (L) 13.0 - 17.0 g/dL   Collection site art line    Drawn by Nurse    Sample type ARTERIAL   Renal function panel (daily at 1600)     Status: Abnormal   Collection Time: 02/08/20  3:02 PM  Result Value Ref Range   Sodium 134 (L) 135 - 145 mmol/L   Potassium 4.4 3.5 - 5.1 mmol/L   Chloride 102 98 - 111 mmol/L   CO2 24 22 - 32 mmol/L   Glucose, Bld 199 (H) 70 - 99 mg/dL    Comment: Glucose reference range applies only to samples taken after fasting for at least 8 hours.   BUN 21 (H) 6 - 20 mg/dL   Creatinine, Ser 1.45 (H) 0.61 - 1.24 mg/dL   Calcium 7.6 (L) 8.9 - 10.3 mg/dL   Phosphorus 3.3 2.5 - 4.6 mg/dL   Albumin 1.5 (L) 3.5 - 5.0 g/dL   GFR, Estimated 58 (L) >60 mL/min    Comment: (NOTE) Calculated using the CKD-EPI Creatinine Equation (2021)    Anion gap 8 5 - 15    Comment: Performed at [facility] [address], [place], [place] [address]  CBC     Status: Abnormal   Collection Time: 02/08/20  3:02 PM  Result Value Ref Range   WBC 14.3 (H) 4.0 - 10.5 K/uL   RBC 2.46 (L) 4.22 - 5.81 MIL/uL   Hemoglobin 7.3 (L) 13.0 - 17.0 g/dL   HCT 22.0 (L) 39.0 - 52.0 %   MCV 89.4 80.0 - 100.0 fL   MCH 29.7 26.0 - 34.0 pg   MCHC 33.2 30.0 - 36.0 g/dL   RDW 18.9 (H) 11.5 - 15.5 %   Platelets 246 150 - 400 K/uL   nRBC 0.0 0.0 - 0.2 %    Comment: Performed at [facility] [address] [place], [place] [address]  Glucose, capillary     Status: Abnormal    Collection Time: 02/08/20  4:04 PM  Result Value Ref Range   Glucose-Capillary 200 (H) 70 - 99 mg/dL    Comment: Glucose reference range applies only to samples taken after fasting for at least 8 hours.  Glucose, capillary     Status: Abnormal   Collection Time: 02/08/20  4:59 PM  Result Value Ref Range   Glucose-Capillary 190 (H) 70 - 99 mg/dL    Comment: Glucose reference range applies only to samples taken after fasting for at least 8 hours.  Glucose, capillary     Status: Abnormal   Collection Time: 02/08/20  5:58 PM  Result Value Ref Range   Glucose-Capillary 206 (H) 70 - 99 mg/dL    Comment: Glucose reference range applies only to samples taken after fasting for at least 8 hours.  Glucose, capillary     Status: Abnormal   Collection Time: 02/08/20  6:59 PM  Result Value Ref Range   Glucose-Capillary 194 (H) 70 - 99 mg/dL    Comment: Glucose reference range applies only to samples taken after fasting for at least 8 hours.  Prepare RBC (crossmatch)     Status: None   Collection Time: 02/08/20  7:00 PM  Result Value Ref Range   Order Confirmation      ORDER PROCESSED BY BLOOD BANK Performed at [facility], [place] [address] [place], [place] [address]   Glucose, capillary     Status: Abnormal   Collection Time: 02/08/20  8:00 PM  Result Value  Ref Range   Glucose-Capillary 176 (H) 70 - 99 mg/dL    Comment: Glucose reference range applies only to samples taken after fasting for at least 8 hours.  Glucose, capillary     Status: Abnormal   Collection Time: 02/08/20  9:00 PM  Result Value Ref Range   Glucose-Capillary 197 (H) 70 - 99 mg/dL    Comment: Glucose reference range applies only to samples taken after fasting for at least 8 hours.  Glucose, capillary     Status: Abnormal   Collection Time: 02/08/20  9:54 PM  Result Value Ref Range   Glucose-Capillary 188 (H) 70 - 99 mg/dL    Comment: Glucose reference range applies only to samples taken after fasting for at least 8  hours.  Glucose, capillary     Status: Abnormal   Collection Time: 02/08/20 10:57 PM  Result Value Ref Range   Glucose-Capillary 215 (H) 70 - 99 mg/dL    Comment: Glucose reference range applies only to samples taken after fasting for at least 8 hours.  Glucose, capillary     Status: Abnormal   Collection Time: 02/08/20 11:55 PM  Result Value Ref Range   Glucose-Capillary 200 (H) 70 - 99 mg/dL    Comment: Glucose reference range applies only to samples taken after fasting for at least 8 hours.  Glucose, capillary     Status: Abnormal   Collection Time: 02/09/20 12:57 AM  Result Value Ref Range   Glucose-Capillary 179 (H) 70 - 99 mg/dL    Comment: Glucose reference range applies only to samples taken after fasting for at least 8 hours.  Glucose, capillary     Status: Abnormal   Collection Time: 02/09/20  2:04 AM  Result Value Ref Range   Glucose-Capillary 173 (H) 70 - 99 mg/dL    Comment: Glucose reference range applies only to samples taken after fasting for at least 8 hours.  Glucose, capillary     Status: Abnormal   Collection Time: 02/09/20  2:59 AM  Result Value Ref Range   Glucose-Capillary 168 (H) 70 - 99 mg/dL    Comment: Glucose reference range applies only to samples taken after fasting for at least 8 hours.  Glucose, capillary     Status: Abnormal   Collection Time: 02/09/20  3:57 AM  Result Value Ref Range   Glucose-Capillary 166 (H) 70 - 99 mg/dL    Comment: Glucose reference range applies only to samples taken after fasting for at least 8 hours.  Cooxemetry Panel (carboxy, met, total hgb, O2 sat)     Status: Abnormal   Collection Time: 02/09/20  4:30 AM  Result Value Ref Range   Total hemoglobin 9.6 (L) 12.0 - 16.0 g/dL   O2 Saturation 78.3 %   Carboxyhemoglobin 1.8 (H) 0.5 - 1.5 %   Methemoglobin 0.6 0.0 - 1.5 %    Comment: Performed at [facility] [address] [place], [place] [address]  Lactate dehydrogenase     Status: Abnormal   Collection Time:  02/09/20  4:30 AM  Result Value Ref Range   LDH 383 (H) 98 - 192 U/L    Comment: Performed at [facility], [place] [address] [place], [place] [address]  Magnesium     Status: None   Collection Time: 02/09/20  4:30 AM  Result Value Ref Range   Magnesium 1.9 1.7 - 2.4 mg/dL    Comment: Performed at [facility] [address] [place], [place]  [address]  CBC with Differential/Platelet     Status: Abnormal   Collection Time: 02/09/20  4:30 AM  Result Value Ref Range   WBC 14.5 (H) 4.0 - 10.5 K/uL   RBC 2.65 (L) 4.22 - 5.81 MIL/uL   Hemoglobin 7.8 (L) 13.0 - 17.0 g/dL   HCT 23.5 (L) 39.0 - 52.0 %   MCV 88.7 80.0 - 100.0 fL   MCH 29.4 26.0 - 34.0 pg   MCHC 33.2 30.0 - 36.0 g/dL   RDW 19.0 (H) 11.5 - 15.5 %   Platelets 237 150 - 400 K/uL   nRBC 0.1 0.0 - 0.2 %   Neutrophils Relative % 75 %   Neutro Abs 10.9 (H) 1.7 - 7.7 K/uL   Lymphocytes Relative 6 %   Lymphs Abs 0.9 0.7 - 4.0 K/uL   Monocytes Relative 12 %   Monocytes Absolute 1.7 (H) 0.1 - 1.0 K/uL   Eosinophils Relative 5 %   Eosinophils Absolute 0.7 (H) 0.0 - 0.5 K/uL   Basophils Relative 1 %   Basophils Absolute 0.1 0.0 - 0.1 K/uL   Immature Granulocytes 1 %   Abs Immature Granulocytes 0.20 (H) 0.00 - 0.07 K/uL    Comment: Performed at [facility], [address] [place], [place] [address]  Heparin level (unfractionated)     Status: Abnormal   Collection Time: 02/09/20  4:30 AM  Result Value Ref Range   Heparin Unfractionated <0.10 (L) 0.30 - 0.70 IU/mL    Comment: (NOTE) If heparin results are below expected values, and patient dosage has  been confirmed, suggest follow up testing of antithrombin III levels. Performed at [facility], [address] [place], [place] [address]   Renal function panel     Status: Abnormal   Collection Time: 02/09/20  4:30 AM  Result Value Ref Range   Sodium 135 135 - 145 mmol/L   Potassium 3.6 3.5 - 5.1 mmol/L   Chloride 104 98 - 111 mmol/L   CO2 22 22 - 32  mmol/L   Glucose, Bld 238 (H) 70 - 99 mg/dL    Comment: Glucose reference range applies only to samples taken after fasting for at least 8 hours.   BUN 20 6 - 20 mg/dL   Creatinine, Ser 1.20 0.61 - 1.24 mg/dL   Calcium 6.8 (L) 8.9 - 10.3 mg/dL   Phosphorus 2.7 2.5 - 4.6 mg/dL   Albumin 1.4 (L) 3.5 - 5.0 g/dL   GFR, Estimated >60 >60 mL/min    Comment: (NOTE) Calculated using the CKD-EPI Creatinine Equation (2021)    Anion gap 9 5 - 15    Comment: Performed at [facility], [address] [place],  [address]  Glucose, capillary     Status: Abnormal   Collection Time: 02/09/20  5:01 AM  Result Value Ref Range   Glucose-Capillary 164 (H) 70 - 99 mg/dL    Comment: Glucose reference range applies only to samples taken after fasting for at least 8 hours.  Glucose, capillary     Status: Abnormal   Collection Time: 02/09/20  7:01 AM  Result Value Ref Range   Glucose-Capillary 153 (H) 70 - 99 mg/dL    Comment: Glucose reference range applies only to samples taken after fasting for at least 8 hours.  Glucose, capillary     Status: Abnormal   Collection Time: 02/09/20  7:58 AM  Result Value Ref Range   Glucose-Capillary 170 (H) 70 - 99 mg/dL      Comment: Glucose reference range applies only to samples taken after fasting for at least 8 hours.  Prepare RBC (crossmatch)     Status: None   Collection Time: 02/09/20  8:35 AM  Result Value Ref Range   Order Confirmation      ORDER PROCESSED BY BLOOD BANK Performed at [facility], [address], [place], [place] [address]   Type and screen [place]     Status: None (Preliminary result)   Collection Time: 02/09/20  9:00 AM  Result Value Ref Range   ABO/RH(D) A POS    Antibody Screen NEG    Sample Expiration 02/12/2020,2359    Unit Number [id number]    Blood Component Type RED CELLS,LR    Unit division 00    Status of Unit ISSUED    Transfusion Status OK TO TRANSFUSE    Crossmatch Result       Compatible Performed at [facility], [place] [address] [place], [place] [address]   Glucose, capillary     Status: Abnormal   Collection Time: 02/09/20  9:03 AM  Result Value Ref Range   Glucose-Capillary 195 (H) 70 - 99 mg/dL    Comment: Glucose reference range applies only to samples taken after fasting for at least 8 hours.  Glucose, capillary     Status: Abnormal   Collection Time: 02/09/20 10:07 AM  Result Value Ref Range   Glucose-Capillary 199 (H) 70 - 99 mg/dL    Comment: Glucose reference range applies only to samples taken after fasting for at least 8 hours.  Glucose, capillary     Status: Abnormal   Collection Time: 02/09/20 10:55 AM  Result Value Ref Range   Glucose-Capillary 220 (H) 70 - 99 mg/dL    Comment: Glucose reference range applies only to samples taken after fasting for at least 8 hours.  Glucose, capillary     Status: Abnormal   Collection Time: 02/09/20 11:58 AM  Result Value Ref Range   Glucose-Capillary 198 (H) 70 - 99 mg/dL    Comment: Glucose reference range applies only to samples taken after fasting for at least 8 hours.  Glucose, capillary     Status: Abnormal   Collection Time: 02/09/20 12:54 PM  Result Value Ref Range   Glucose-Capillary 177 (H) 70 - 99 mg/dL    Comment: Glucose reference range applies only to samples taken after fasting for at least 8 hours.  Glucose, capillary     Status: Abnormal   Collection Time: 02/09/20  2:00 PM  Result Value Ref Range   Glucose-Capillary 168 (H) 70 - 99 mg/dL    Comment: Glucose reference range applies only to samples taken after fasting for at least 8 hours.    DG CHEST PORT 1 VIEW  Result Date: 02/08/2020 CLINICAL DATA:  ETT, chest tube, open sternum EXAM: PORTABLE CHEST 1 VIEW COMPARISON:  Numerous priors, most recent 02/08/2020 FINDINGS: *Tracheostomy tube terminates 5 cm from the carina. *Dual lumen right IJ approach central venous catheter tip terminates at the superior cavoatrial junction.  *Right upper extremity approach Impella device remains in stable position. *Left chest wall pacer/AICD with tip at the cardiac apex. *Mediastinal and bilateral pleural drains remain in place. *Pacer pads and telemetry leads overlie the chest. Stable cardiomegaly. Persistent hazy opacities throughout the lungs accounting for differences in technique. Stable bilateral effusions as well as a more lobular opacity towards the right lung apex. No pneumothorax. No new acute osseous or soft tissue  abnormalities. IMPRESSION: Lines and tubes as above. Persistent hazy opacities throughout the lungs compatible with edema. Stable bilateral effusions as well as a more indeterminate lobular opacity towards the right lung apex. Electronically Signed   By: Price  DeHay M.D.   On: 02/08/2020 06:56   DG Chest Port 1 View  Result Date: 02/19/2020 CLINICAL DATA:  49-year-old male with tracheostomy. EXAM: PORTABLE CHEST 1 VIEW COMPARISON:  Chest radiograph dated 02/28/2020. FINDINGS: Interval removal of the endotracheal tube and placement of a tracheostomy with tip approximately 5.2 cm above the carina. There has been interval removal of feeding tube. The remainder of the support apparatus in similar position. No significant interval change in cardiomegaly and bilateral interstitial prominence and edema. Right upper lobe extrapleural loculated effusion or hematoma similar or slightly increased. No pneumothorax. IMPRESSION: 1. Interval placement of a tracheostomy with tip above the carina. 2. Slight interval increase in the size of the right upper lobe loculated pleural collection. No other significant interval change in the appearance of the lungs. Electronically Signed   By: Arash  Radparvar M.D.   On: 02/15/2020 19:02    Review of Systems  Unable to perform ROS: Intubated   Blood pressure 105/73, pulse 80, temperature 98.9 F (37.2 C), temperature source Axillary, resp. rate (!) 24, height 6' 3" (1.905 m), weight 103.8 kg,  SpO2 98 %. Physical Exam Vitals and nursing note reviewed.  Cardiovascular:     Rate and Rhythm: Normal rate.     Pulses: Normal pulses.  Pulmonary:     Comments: Trach in place and on vent. Chest:     Chest wall: Deformity present.    Abdominal:     General: Abdomen is flat.  Musculoskeletal:        General: Swelling present.  Skin:    General: Skin is warm.     Coloration: Skin is not jaundiced.  Psychiatric:     Comments: sedated     Assessment/Plan: Sternal wound -I will discuss this with Dr. Atkins.  The patient will likely need a muscle flap to the sternum to help with closure.  Recommend continuation of the VAC for now.  Follow-up will be with risks of complications due to his anticoagulation.  His overall medical condition is very serious and his kidney failure will also work against healing.  We will plan on having a in-depth conversation with the family to understand their wishes as well.  Weber Monnier S Adian Jablonowski 02/09/2020, 3:20 PM     

## 2020-02-09 NOTE — Consult Note (Signed)
CARDIOLOGY CONSULT NOTE  Patient ID: Corey Palmer, MRN: [id number], DOB/AGE: 1970/10/21 50 y.o. Admit date: 01/07/2020 Date of Consult: 02/09/2020  Primary Physician: Sueanne Margarita, MD Primary Cardiologist:   Corey Palmer is a 50 y.o. male who is being seen today for the evaluation of widecomplex tachycardia at the request of *DR BA      HPI Corey Palmer is a 51 y.o. male  With complex and prolonged history with NICM and ICD>> ICD shock for VT-PM followed by impella, CABG, afib and DCCV, CVVH, repeat OR for hematoma and wound vac, left with open chest return to OR for omental flap with trach G tube. On milrinone and amio for afib  Seen 1/11 0500hrs because of changes in QRS morphology following coughing with RBBB and LBBB, the former assoc with worse hemodynamics  This pm started having recurrent runs of nonsustained WCT most consistent with VT, non perfusing      Past Medical History:  Diagnosis Date  . Anginal pain ([place])   . Anxiety   . Arthritis   . Automatic implantable cardioverter-defibrillator in situ 2007   [employer]   . Bipolar disorder ([place])   . CAD (coronary artery disease)    Cardiac cath (08/19/2000) - Nonobstructive, 40% stenosis of LAD,.   . Cancer [facility]) 2013   benign stomach cancer  . CHF (congestive heart failure) ([place])   . Chondrocalcinosis of right knee 06/11/2013  . Chronic systolic heart failure ([place])    2D echo (84/6962) - Systolic function severely reduced., LV EF 20-25%. Akinesis of apical and anteroseptal mycoardium.  last 2D echo (11/2008 ), LV EF 25-30%, diffuse hypokinesis, and grade 1 diastolic dysfunction.  . Depression   . Diabetes uncomplicated adult-type II    on insulin therapy, a1c 12.9 in 01/2011  . Dilated cardiomyopathy ([place])    status post ICD placement in 2008 c/b tearing at the atrial junction resulting in tamponade and urgent thoracotomy  . Dyslipidemia   . Dysrhythmia   . GERD (gastroesophageal reflux disease)   . Gout,  unspecified   . Hepatic steatosis 2011   seen on ultrasound.   . History of kidney stones   . Hx of echocardiogram    Echo (04/2013):  Mild LVH, EF 20-25%, mild LAE, mild to mod RVE with mild reduced RVSF.  Marland Kitchen Hypertension   . Obesity (BMI 30.0-34.9)   . Pacemaker   . Paroxysmal atrial fibrillation (H[facility])    on chronic coumadin, goal INR 2-3. status post direct current  . Peptic ulcer   . Shortness of breath   . Sleep apnea    does not have CPAP      Surgical History:  Past Surgical History:  Procedure Laterality Date  . APPLICATION OF WOUND VAC N/A 01/27/2020   Procedure: APPLICATION OF WOUND VAC;  Surgeon: Prescott Gum, Collier Salina, MD;  Location: [place];  Service: Thoracic;  Laterality: N/A;  . APPLICATION OF WOUND VAC N/A 12/30/2019   Procedure: APPLICATION OF WOUND VAC;  Surgeon: [name], MD;  Location: [place];  Service: Thoracic;  Laterality: N/A;  . APPLICATION OF WOUND VAC N/A 02/20/2020   Procedure: WOUND VAC CHANGE;  Surgeon: [name], MD;  Location: [place];  Service: Thoracic;  Laterality: N/A;  . BEDSIDE EVACUATION OF HEMATOMA N/A 01/08/2020   Procedure: CHEST EXPLORATION '@2H12' ;  Surgeon: Ivin Poot, MD;  Location: [place];  Service: Cardiovascular;  Laterality: N/A;  . BEDSIDE EVACUATION OF  HEMATOMA N/A 01/04/2020   Procedure: CHEST EXPLORATION '@2H12' ;  Surgeon: [name], MD;  Location: [place];  Service: Open Heart Surgery;  Laterality: N/A;  . BIOPSY  12/01/2019   Procedure: BIOPSY;  Surgeon: Arta Silence, MD;  Location: [facility];  Service: Endoscopy;;  . CARDIAC CATHETERIZATION    . CARDIAC PACEMAKER PLACEMENT    . CHOLECYSTECTOMY    . COLONOSCOPY    . CORONARY ARTERY BYPASS GRAFT    . CORONARY ARTERY BYPASS GRAFT N/A 01/22/2020   Procedure: CORONARY ARTERY BYPASS GRAFTING (CABG), ON PUMP, TIMES FOUR, USING LEFT INTERNAL MAMMARY ARTERY AND ENDOSCOPICALLY HARVESTED RIGHT GREATER SAPHENOUS VEIN;  Surgeon: [name], MD;  Location: [place];   Service: Open Heart Surgery;  Laterality: N/A;  . ESOPHAGOGASTRODUODENOSCOPY  04/05/2011   Procedure: ESOPHAGOGASTRODUODENOSCOPY (EGD);  Surgeon: Irene Shipper, MD;  Location: [facility];  Service: Endoscopy;  Laterality: N/A;  . ESOPHAGOGASTRODUODENOSCOPY (EGD) WITH PROPOFOL N/A 12/01/2019   Procedure: ESOPHAGOGASTRODUODENOSCOPY (EGD) WITH PROPOFOL;  Surgeon: Arta Silence, MD;  [facility];  Service: Endoscopy;  Laterality: N/A;  . EXPLORATION POST OPERATIVE OPEN HEART N/A 01/14/2020   Procedure: RE-EXPLORATION POST OPERATIVE OPEN HEART;  Surgeon: [name], MD;  Location: [place];  Service: Open Heart Surgery;  Laterality: N/A;  . GASTROSTOMY N/A 02/25/2020   Procedure: OPEN INSERTION OF 16 FR. BOLUS GASTROSTOMY TUBE;  Surgeon: [name], MD;  [facility]: [facility];  Service: Thoracic;  Laterality: N/A;  . GREATER OMENTAL FLAP CLOSURE N/A 02/12/2020   Procedure: GREATER OMENTAL FLAP CLOSURE OF STERNUM;  Surgeon: [name], MD;  Location: [place];  Service: Thoracic;  Laterality: N/A;  . JEJUNOSTOMY N/A 02/26/2020   Procedure: 95 FR JEJUNOSTOMY INSERTION ;  Surgeon: [name], MD;  [facility];  Service: Thoracic;  Laterality: N/A;  . JOINT REPLACEMENT     left knee replacement  . KNEE ARTHROSCOPY Right 06/11/2013   Procedure: RIGHT KNEE ARTHROSCOPY with chondroplasty;  Surgeon: Johnny Bridge, MD;  Location: [place];  Service: Orthopedics;  Laterality: Right;  . PLACEMENT OF IMPELLA LEFT VENTRICULAR ASSIST DEVICE N/A 01/16/2020   Procedure: PLACEMENT OF IMPELLA LEFT VENTRICULAR ASSIST DEVICE;  Surgeon: [name], MD;  Location: [place];  Service: Open Heart Surgery;  Laterality: N/A;  . PLACEMENT OF IMPELLA LEFT VENTRICULAR ASSIST DEVICE Right 01/11/2020   Procedure: AXILLARY  ARTERY PLACEMENT OF ABIOMED IMPELLA  5.5 LEFT VENTRICULAR ASSIST DEVICE;  Surgeon: [name], MD;  Location: [place];  Service: Open Heart Surgery;  Laterality: Right;  .  RIGHT/LEFT HEART CATH AND CORONARY ANGIOGRAPHY N/A 01/07/2020   Procedure: RIGHT/LEFT HEART CATH AND CORONARY ANGIOGRAPHY;  Surgeon: Larey Dresser, MD;  Location: [place] CV [facility];  Service: Cardiovascular;  Laterality: N/A;  . STERNAL CLOSURE N/A 01/07/2020   Procedure: STERNAL CLOSURE;  Surgeon: Ivin Poot, MD;  Location: [place];  Service: Thoracic;  Laterality: N/A;  . STERNAL CLOSURE N/A 02/05/2020   Procedure: STERNAL CLOSURE;  Surgeon: [name], MD;  Location: [place];  Service: Thoracic;  Laterality: N/A;  . STERNAL WOUND DEBRIDEMENT N/A 12/30/2019   Procedure: STERNAL WASH OUT;  Surgeon: [name], MD;  Location: [place];  Service: Thoracic;  Laterality: N/A;  . STERNAL WOUND DEBRIDEMENT N/A 02/17/2020   Procedure: STERNAL WASHOUT;  Surgeon: [name], MD;  Location: [place];  Service: Thoracic;  Laterality: N/A;  . STERNAL WOUND DEBRIDEMENT N/A 02/26/2020   Procedure: STERNAL  WASHOUT WITH WOUND VAC CHANGE;  Surgeon: [name], MD;  [facility];  Service: Thoracic;  Laterality: N/A;  . TEE WITHOUT CARDIOVERSION N/A 12/30/2019   Procedure: TRANSESOPHAGEAL ECHOCARDIOGRAM (TEE);  Surgeon: [name], MD;  Location: [place];  Service: Open Heart Surgery;  Laterality: N/A;  . TEE WITHOUT CARDIOVERSION N/A 01/26/2020   Procedure: TRANSESOPHAGEAL ECHOCARDIOGRAM (TEE);  Surgeon: [name], MD;  Location: [place];  Service: Open Heart Surgery;  Laterality: N/A;  . TEE WITHOUT CARDIOVERSION N/A 01/07/2020   Procedure: TRANSESOPHAGEAL ECHOCARDIOGRAM (TEE);  Surgeon: Prescott Gum, Collier Salina, MD;  Location: [place];  Service: Thoracic;  Laterality: N/A;  . TEE WITHOUT CARDIOVERSION N/A 02/17/2020   Procedure: TRANSESOPHAGEAL ECHOCARDIOGRAM (TEE);  Surgeon: [name], MD;  Location[facility] OR;  Service: Thoracic;  Laterality: N/A;  . TEE WITHOUT CARDIOVERSION N/A 02/27/2020   Procedure: TRANSESOPHAGEAL ECHOCARDIOGRAM (TEE);  Surgeon: [name], MD;   Location: [facility] OR;  Service: Thoracic;  Laterality: N/A;  . TOTAL KNEE ARTHROPLASTY Left   . TRACHEOSTOMY TUBE PLACEMENT N/A 02/13/2020   Procedure: OPEN TRACHEOSTOMY USING #8 SHILEY;  Surgeon: [name], MD;  Location: [facility] OR;  Service: Thoracic;  Laterality: N/A;     Home Meds: Prior to Admission medications   Medication Sig Start Date End Date Taking? Authorizing Provider  apixaban (ELIQUIS) 5 MG TABS tablet Take 1 tablet (5 mg total) by mouth 2 (two) times daily. 12/02/19  Yes Arta Silence, MD  aspirin 81 MG chewable tablet Chew 324 mg by mouth as needed (for chest pain).   Yes [provider]  carvedilol (COREG) 25 MG tablet Take 1 tablet (25 mg total) by mouth 2 (two) times daily. Needs appt Patient taking differently: Take 25 mg by mouth 2 (two) times daily.  09/10/19  Yes Larey Dresser, MD  dapagliflozin propanediol (FARXIGA) 10 MG TABS tablet Take 10 mg by mouth daily before breakfast. 02/22/19  Yes Larey Dresser, MD  dicyclomine (BENTYL) 10 MG capsule Take 10 mg by mouth 3 (three) times daily before meals.   Yes [provider]  digoxin (LANOXIN) 0.125 MG tablet Take 1 tablet (0.125 mg total) by mouth daily. 02/19/19 02/01/2020 Yes Larey Dresser, MD  divalproex (DEPAKOTE ER) 500 MG 24 hr tablet Take 1 tablet (500 mg total) by mouth at bedtime. 12/06/19 12/05/20 Yes Pucilowski, Olgierd A, MD  FLUoxetine (PROZAC) 20 MG capsule Take 4 capsules (80 mg total) by mouth daily. Patient taking differently: Take 40 mg by mouth 2 (two) times daily.  12/06/19 12/05/20 Yes Pucilowski, Olgierd A, MD  furosemide (LASIX) 20 MG tablet TAKE 2 TABLETS (40 MG TOTAL) BY MOUTH IN THE MORNING AND 1 TABLET (20 MG TOTAL) EVERY EVENING. Patient taking differently: TAKE 2 TABLETS (40 MG TOTAL) BY MOUTH IN THE MORNING AND 1 TABLET (20 MG TOTAL) EVERY EVENING 11/08/19  Yes Larey Dresser, MD  insulin aspart (NOVOLOG FLEXPEN) 100 UNIT/ML FlexPen Inject 5-50 Units into the skin See admin  instructions. Inject 5-50 units into the skin three times a day before meals, PER SLIDING SCALE   Yes [provider]  magnesium oxide (MAG-OX) 400 MG tablet Take 800 mg by mouth daily.    Yes [provider]  omeprazole (PRILOSEC) 40 MG capsule Take 1 capsule (40 mg total) by mouth daily. Patient taking differently: Take 40 mg by mouth 2 (two) times daily before a meal.  08/18/13  Yes Larey Dresser, MD  ondansetron [facility])  4 MG tablet Take 4 mg by mouth every 8 (eight) hours as needed for nausea or vomiting.   Yes [provider]  POTASSIUM PO Take 4 tablets by mouth in the morning.   Yes [provider]  promethazine (PHENERGAN) 25 MG tablet Take 25 mg by mouth every 8 (eight) hours as needed for nausea or vomiting.  10/12/19  Yes [provider]  sacubitril-valsartan (ENTRESTO) 49-51 MG Take 1 tablet by mouth 2 (two) times daily. 02/19/19  Yes Larey Dresser, MD  Semaglutide,0.25 or 0.5MG/DOS, (OZEMPIC, 0.25 OR 0.5 MG/DOSE,) 2 MG/1.5ML SOPN Inject 0.5 mg into the skin once a week. Patient taking differently: Inject 0.5 mg into the skin every Sunday.  12/01/19  Yes Renato Shin, MD  spironolactone (ALDACTONE) 25 MG tablet Take 1 tablet (25 mg total) by mouth daily. Needs appt Patient taking differently: Take 25 mg by mouth daily.  06/22/19  Yes Larey Dresser, MD  busPIRone (BUSPAR) 10 MG tablet TAKE 1 TABLET BY MOUTH THREE TIMES A DAY 01/12/2020   Pucilowski, Olgierd A, MD  glucose blood (CONTOUR TEST) test strip 1 each by Other route 2 (two) times daily. And lancets 1/day 02/03/19   Renato Shin, MD  hydrOXYzine (VISTARIL) 50 MG capsule TAKE 1 CAPSULE BY MOUTH 3 TIMES DAILY AS NEEDED FOR ANXIETY. 01/11/20   Pucilowski, Olgierd A, MD  insulin glargine (LANTUS SOLOSTAR) 100 UNIT/ML Solostar Pen Inject 40 Units into the skin every morning. 12/01/19   Renato Shin, MD    Inpatient Medications:  . vitamin C  500 mg Per Tube TID  . aspirin  324 mg Per  Tube Daily  . atorvastatin  80 mg Per Tube Daily  . busPIRone  10 mg Per Tube TID  . chlorhexidine gluconate (MEDLINE KIT)  15 mL Mouth Rinse BID  . Chlorhexidine Gluconate Cloth  6 each Topical Daily  . clonazepam  0.25 mg Per Tube BID  . darbepoetin (ARANESP) injection - NON-DIALYSIS  100 mcg Subcutaneous Q Sat-1800  . docusate  100 mg Per Tube BID  . feeding supplement (PROSource TF)  45 mL Per Tube TID  . fiber  1 packet Per Tube BID  . FLUoxetine  40 mg Per Tube BID  . lidocaine  1 patch Transdermal Q24H  . mouth rinse  15 mL Mouth Rinse 10 times per day  . melatonin  5 mg Per Tube Daily  . midodrine  15 mg Per Tube TID WC  . multivitamin  1 tablet Per Tube QHS  . oxyCODONE  10 mg Per Tube Q6H  . pantoprazole (PROTONIX) IV  40 mg Intravenous Q12H  . polyethylene glycol  17 g Per Tube Daily  . sodium chloride flush  10-40 mL Intracatheter Q12H  . thiamine  100 mg Per Tube Daily  . valproic acid  250 mg Per Tube BID    Allergies:  Allergies  Allergen Reactions  . Orange Fruit Anaphylaxis, Hives and Other (See Comments)    Per Pt- Blisters around lips and Hives all over, also  . Penicillins Anaphylaxis    Did it involve swelling of the face/tongue/throat, SOB, or low BP? Yes Did it involve sudden or severe rash/hives, skin peeling, or any reaction on the inside of your mouth or nose? Yes Did you need to seek medical attention at a hospital or doctor's office? No When did it last happen?childhood If all above answers are "NO", may proceed with cephalosporin use.  [name] [Empagliflozin] Itching  .  [name] [Insulin Glargine] Nausea And Vomiting    Social History   Socioeconomic History  . Marital status: Married    Spouse name: Not on file  . Number of children: 3  . Years of education: college  . Highest education level: Not on file  Occupational History  . Occupation: bar tender  Tobacco Use  . Smoking status: Never Smoker  . Smokeless tobacco:  Never Used  Vaping Use  . Vaping Use: Never used  Substance and Sexual Activity  . Alcohol use: No  . Drug use: No  . Sexual activity: Yes  Other Topics Concern  . Not on file  Social History Narrative   Lives in [place].   Studied sports medicine in college, got a BS - used to work with the [employer].         Social Determinants of Health   Financial Resource Strain: Not on file  Food Insecurity: Not on file  Transportation Needs: Not on file  Physical Activity: Not on file  Stress: Not on file  Social Connections: Not on file  Intimate Partner Violence: Not on file     Family History  Problem Relation Age of Onset  . Diabetes Mother   . Coronary artery disease Mother 23  . Gout Mother   . Heart disease Mother   . Heart attack Mother 75  . Diabetes Father   . Coronary artery disease Father 40       Died in a house fire   . Heart disease Father   . Heart disease Brother      ROS:  Please see the history of present illness.      All other systems reviewed and negative.    Physical Exam  Blood pressure 108/70, pulse 79, temperature 98.8 F (37.1 C), temperature source Axillary, resp. rate (!) 24, height '6\' 3"'  (1.905 m), weight 103.8 kg, SpO2 99 %. General: Well developed, well nourished male intubated with open chest and multiple lines, CVVH and impella in place  Head: Normocephalic, atraumatic, sclera non-icteric, no xanthomas, nares are without discharge. EENT: normal  Lungs: good airmovement bilaterally  Heart: RRR   Abdomen: Soft,   Extremities: No clubbing or cyanosis.   Skin: Warm and Dry Neuro: sedated       Labs: Chemistry Recent Labs  Lab 02/08/20 1502 02/09/20 0430 02/09/20 1513 02/09/20 1519  NA 134* 135 137 135  K 4.4 3.6 4.3 4.3  CL 102 104  --  103  CO2 24 22  --  24  GLUCOSE 199* 238*  --  181*  BUN 21* 20  --  23*  CREATININE 1.45* 1.20  --  1.38*  CALCIUM 7.6* 6.8*  --  7.9*  ALBUMIN 1.5* 1.4*  --  1.7*  GFRNONAA 58* >60  --   >60  ANIONGAP 8 9  --  8     Hematology Recent Labs  Lab 02/08/20 1502 02/09/20 0430 02/09/20 1513 02/09/20 1519  WBC 14.3* 14.5*  --  17.2*  RBC 2.46* 2.65*  --  3.42*  HGB 7.3* 7.8* 9.9* 9.7*  HCT 22.0* 23.5* 29.0* 30.1*  MCV 89.4 88.7  --  88.0  MCH 29.7 29.4  --  28.4  MCHC 33.2 33.2  --  32.2  RDW 18.9* 19.0*  --  19.4*  PLT 246 237  --  278    Cardiac EnzymesNo results for input(s): TROPONINI in the last 168 hours. No results for input(s): TROPIPOC in the last 168  hours.   BNPNo results for input(s): BNP, PROBNP in the last 168 hours.   DDimer No results for input(s): DDIMER in the last 168 hours.   Lab Results  Component Value Date   CHOL 176 10/01/2018   HDL 25 (L) 10/01/2018   LDLCALC 126 (H) 10/01/2018   TRIG 126 10/01/2018    Thyroid Function Tests: No results for input(s): TSH, T4TOTAL, T3FREE, THYROIDAB in the last 72 hours.  Invalid input(s): FREET3 Miscellaneous Lab Results  Component Value Date   DDIMER 1.78 (H) 07/16/2011    Radiology/Studies: \  Estimated Creatinine Clearance: 82.6 mL/min (A) (by C-G formula based on SCr of 1.38 mg/dL (H)).  DG Chest 1 View  Result Date: 02/08/2020 CLINICAL DATA:  History of open heart surgery EXAM: CHEST  1 VIEW COMPARISON:  02/01/2020 FINDINGS: Endotracheal tube in good position. Impella device overlying the left ventricle region. AICD unchanged in position. Right jugular central venous catheter tip in the mid SVC. Bilateral chest tubes in place. No pneumothorax. Right arm PICC tip enters SVC with the tip not visualized. Cardiac enlargement with slight vascular congestion. Improvement in bibasilar atelectasis. Small left effusion. IMPRESSION: Support lines unchanged in position. Improved aeration in the lung bases.  No pneumothorax. Electronically Signed   By: Franchot Gallo M.D.   On: 02/12/2020 16:16   DG Chest 1 View  Result Date: 01/04/2020 CLINICAL DATA:  Status post open heart surgery, respiratory  failure EXAM: CHEST  1 VIEW COMPARISON:  01/24/2020 FINDINGS: Endotracheal tube seen at 3.3 cm above the carina. Nasoenteric feeding tube extends into the upper abdomen beyond the margin of the examination. Right internal jugular Swan-Ganz catheter is seen with its tip within the right pulmonary artery. Right subclavian left ventricular assist device overlies its expected position across the aortic root and left ventricular outflow tract. Bilateral chest tubes are in place. Lung volumes are small. Tiny left pleural effusion present. Mild to moderate perihilar interstitial pulmonary edema is present. No pneumothorax. Cardiac size is mildly enlarged, unchanged. Left subclavian pacemaker is unchanged with its lead extending beyond the inferior margin of the exam. IMPRESSION: Stable support lines and tubes. Bilateral chest tubes in place.  No pneumothorax. Moderate perihilar interstitial pulmonary edema. Stable pulmonary hypoinflation. Electronically Signed   By: Fidela Salisbury MD   On: 01/02/2020 06:05   DG Chest 1 View  Result Date: 01/21/2020 CLINICAL DATA:  Persistent emesis.  History of open heart surgery EXAM: CHEST  1 VIEW COMPARISON:  Yesterday FINDINGS: Endotracheal tube with tip just below the clavicular heads. The enteric tube reaches the stomach. Chest tubes in place. Swan-Ganz catheter with tip at the right main pulmonary artery. Defibrillator lead into the right ventricle. Ventricular assist device in stable position. Unchanged hazy appearance of the bilateral chest, likely a combination of edema and atelectasis. No visible pneumothorax. Stable cardiomegaly. IMPRESSION: 1. Stable hardware positioning. 2. Stable edema and/or atelectasis symmetrically in the low volume lungs. Electronically Signed   By: Monte Fantasia M.D.   On: 01/21/2020 08:33   DG Chest 1 View  Result Date: 01/01/2020 CLINICAL DATA:  Post right bronchoscopy after heart surgery EXAM: CHEST  1 VIEW COMPARISON:  01/07/2020  FINDINGS: Interval re-expansion of right upper lobe atelectasis. Progression of hazy airspace disease bilaterally most likely pulmonary edema. Small pleural effusions bilaterally. Endotracheal tube in good position. Impella device in the left ventricle. Cardiac enlargement. Swan-Ganz catheter tip in the right main pulmonary artery. Right chest tube in place.  No pneumothorax. IMPRESSION: Interval  re-expansion of right upper lobe atelectasis Progression of bilateral airspace disease may represent edema. Small bilateral effusions. No pneumothorax. Electronically Signed   By: Franchot Gallo M.D.   On: 01/02/2020 16:41   DG Chest 1 View  Result Date: 01/11/2020 CLINICAL DATA:  Impella.  History of CHF. EXAM: CHEST  1 VIEW COMPARISON:  01/10/2020. FINDINGS: Swan-Ganz catheter in stable position. Impella device and cardiac pacer stable position. Stable cardiomegaly and pulmonary venous congestion. Mild bilateral interstitial prominence. Mild CHF cannot be excluded. Low lung volumes with bibasilar atelectasis. Tiny left pleural effusion cannot be excluded. Surgical staples right chest. IMPRESSION: 1. Swan-Ganz catheter in stable position. Impella device and cardiac pacer in stable position. 2. Cardiomegaly with pulmonary venous congestion and mild interstitial prominence suggesting mild CHF. Electronically Signed   By: Marcello Moores  Register   On: 01/11/2020 06:37   DG Abd 1 View  Result Date: 01/31/2020 CLINICAL DATA:  Abdominal pain. EXAM: ABDOMEN - 1 VIEW COMPARISON:  01/24/2020 FINDINGS: 0621 hours. Feeding tube tip is in the distal stomach near the pylorus. Diffuse gaseous distension of right and transverse colon with scattered small bowel gas evident. IMPRESSION: Feeding tube tip is in the distal stomach near the pylorus. No radiographic features of overt small bowel obstruction although gaseous distention of colon and scattered small bowel may reflect component of ileus. Electronically Signed   By: Misty Stanley  M.D.   On: 01/31/2020 07:12   DG Abd 1 View  Result Date: 01/21/2020 CLINICAL DATA:  Ileus. EXAM: ABDOMEN - 1 VIEW COMPARISON:  01/14/2020 FINDINGS: 0804 hours. Gaseous distention of stomach and colon evident. No substantial small bowel dilatation. NG tube tip is just above the esophagogastric junction. IMPRESSION: 1. NG tube tip is in the distal esophagus just above the gastroesophageal junction. 2. Gaseous distention of stomach and colon. These results will be called to the ordering clinician or representative by the Radiologist Assistant, and communication documented in the PACS or [employer]. Electronically Signed   By: Misty Stanley M.D.   On: 01/21/2020 08:34   DG CHEST PORT 1 VIEW  Result Date: 02/08/2020 CLINICAL DATA:  ETT, chest tube, open sternum EXAM: PORTABLE CHEST 1 VIEW COMPARISON:  Numerous priors, most recent 02/08/2020 FINDINGS: *Tracheostomy tube terminates 5 cm from the carina. *Dual lumen right IJ approach central venous catheter tip terminates at the superior cavoatrial junction. *Right upper extremity approach Impella device remains in stable position. *Left chest wall pacer/AICD with tip at the cardiac apex. *Mediastinal and bilateral pleural drains remain in place. *Pacer pads and telemetry leads overlie the chest. Stable cardiomegaly. Persistent hazy opacities throughout the lungs accounting for differences in technique. Stable bilateral effusions as well as a more lobular opacity towards the right lung apex. No pneumothorax. No new acute osseous or soft tissue abnormalities. IMPRESSION: Lines and tubes as above. Persistent hazy opacities throughout the lungs compatible with edema. Stable bilateral effusions as well as a more indeterminate lobular opacity towards the right lung apex. Electronically Signed   By: Lovena Le M.D.   On: 02/08/2020 06:56   DG Chest Port 1 View  Result Date: 02/03/2020 CLINICAL DATA:  50 year old male with tracheostomy. EXAM: PORTABLE CHEST 1  VIEW COMPARISON:  Chest radiograph dated 02/27/2020. FINDINGS: Interval removal of the endotracheal tube and placement of a tracheostomy with tip approximately 5.2 cm above the carina. There has been interval removal of feeding tube. The remainder of the support apparatus in similar position. No significant interval change in cardiomegaly and bilateral  interstitial prominence and edema. Right upper lobe extrapleural loculated effusion or hematoma similar or slightly increased. No pneumothorax. IMPRESSION: 1. Interval placement of a tracheostomy with tip above the carina. 2. Slight interval increase in the size of the right upper lobe loculated pleural collection. No other significant interval change in the appearance of the lungs. Electronically Signed   By: Anner Crete M.D.   On: 01/29/2020 19:02   DG CHEST PORT 1 VIEW  Result Date: 01/31/2020 CLINICAL DATA:  Post CABG, history of CHF, CAD EXAM: PORTABLE CHEST 1 VIEW COMPARISON:  Numerous priors, most recent 02/06/2020 FINDINGS: Endotracheal tube tip terminates 5.8 cm from the carina. Transesophageal tube tip terminates below the margins of imaging, beyond the GE junction. Impella device remains in place within the left ventricle. Dual lumen right IJ catheter tip terminates at the superior cavoatrial junction. Bilateral pleural and mediastinal drains remain in place. Pacer/defibrillator battery pack overlies the left chest wall with lead at the cardiac apex. Persistent hazy opacities throughout the right lung and minimally within the left lung base likely reflecting some asymmetric pulmonary edema with small persistent effusions. More lobular opacity along the right lung apex. No pneumothorax. Stable cardiomediastinal contours. No acute osseous or soft tissue abnormality. IMPRESSION: No significant interval change with persistent cardiomegaly and findings of pulmonary edema, bilateral effusions, and a lobular attenuation in the right lung apex which may  reflect a loculated effusion or extrapleural hematoma. Lines and tubes remain in stable, satisfactory positioning as above. Electronically Signed   By: Lovena Le M.D.   On: 02/26/2020 06:00   DG CHEST PORT 1 VIEW  Result Date: 02/06/2020 CLINICAL DATA:  Status post CABG EXAM: PORTABLE CHEST 1 VIEW COMPARISON:  Chest x-rays dated 02/05/2020 and 02/04/2020. FINDINGS: Stable cardiomegaly. Support apparatus, including bilateral chest tubes, appears stable in position. Patchy hazy opacities throughout the RIGHT lung, stable, most likely asymmetric pulmonary edema. Persistent bibasilar opacities, likely a combination of atelectasis and small pleural effusions. Stable rounded opacity at the RIGHT lung apex, most likely loculated pleural or extrapleural effusion/collection. IMPRESSION: 1. Overall, no significant interval change. 2. Stable cardiomegaly and pulmonary edema. 3. Stable bibasilar opacities, likely combination of atelectasis and small pleural effusions. 4. Stable rounded opacity at the RIGHT lung apex, new since chest x-ray of 02/13/2020, suspected loculated pleural or extrapleural effusion/collection, possible soft tissue hematoma given the overlying surgical staples. 5. Support apparatus appears stable in position. Electronically Signed   By: Franki Cabot M.D.   On: 02/06/2020 08:50   DG Chest Port 1 View  Result Date: 02/05/2020 CLINICAL DATA:  Chest tube EXAM: PORTABLE CHEST 1 VIEW COMPARISON:  Yesterday FINDINGS: Endotracheal tube with tip near the clavicular heads. Impella from the right in unchanged position. Chest tubes in place. Dialysis catheter on the right with tip the lower SVC. Single chamber ICD lead into the right ventricle. Loculated pleural or Extrapleural opacity at the right apex which is unchanged. Hazy appearance of the bilateral chest attributed to edema and atelectasis. IMPRESSION: 1. Stable hardware positioning. 2. Cardiomegaly and pulmonary edema. 3. Asymmetric loculated  appearing pleural or extrapleural opacity at the right apex which did not appear at the same time is the right-sided central lines. Electronically Signed   By: Monte Fantasia M.D.   On: 02/05/2020 07:19   DG CHEST PORT 1 VIEW  Result Date: 02/04/2020 CLINICAL DATA:  Open heart surgery EXAM: PORTABLE CHEST 1 VIEW COMPARISON:  02/03/2020 FINDINGS: Endotracheal tube remains in place with the tip not well  visualized. Feeding tube in place. Impella device in the left ventricle unchanged. AICD unchanged. Bilateral chest tubes unchanged.  No pneumothorax. Loculated right apical pleural fluid mildly improved from yesterday. This was not present on 02/13/2020. Question extrapleural hematoma from Impella. Bibasilar airspace disease right greater than left unchanged likely atelectasis. No pleural effusion. IMPRESSION: Support lines remain in satisfactory position and unchanged. No pneumothorax Loculated right apical pleural fluid mildly improved. Question hematoma from Impella. Electronically Signed   By: Franchot Gallo M.D.   On: 02/04/2020 08:00   DG Chest Port 1 View  Result Date: 02/03/2020 CLINICAL DATA:  Acute respiratory failure, history coronary artery disease, CHF, diabetes mellitus, dilated cardiomyopathy, hypertension EXAM: PORTABLE CHEST 1 VIEW COMPARISON:  Portable exam 1243 hours compared to 02/03/2020 FINDINGS: Tip of endotracheal tube projects 4.7 cm above carina. Feeding tube extends into abdomen. Mediastinal drain and BILATERAL thoracostomy tubes present. RIGHT jugular central venous catheter with tip projecting over SVC. Impella catheter present. ICD via LEFT subclavian approach projects over RIGHT ventricle. Enlargement of cardiac silhouette. RIGHT pleural effusion, partially loculated at RIGHT apex laterally. Bibasilar atelectasis. No pneumothorax. IMPRESSION: Sex bibasilar atelectasis. RIGHT pleural effusion, partially loculated in the upper RIGHT chest laterally. Electronically Signed   By: Lavonia Dana M.D.   On: 02/03/2020 13:03   DG CHEST PORT 1 VIEW  Result Date: 02/03/2020 CLINICAL DATA:  Respiratory failure EXAM: PORTABLE CHEST 1 VIEW COMPARISON:  02/05/2020 FINDINGS: Endotracheal tube seen 4 cm above the carina. Nasoenteric feeding tube extends into the upper abdomen beyond the margin of the examination. Right internal jugular hemodialysis catheter tip at the superior cavoatrial junction. Right subclavian left ventricular assist device appears slightly withdrawn, though this likely still crosses the aortic valve plane. Median sternotomy wires have been removed in the interval. Loculated right upper lobe pleural fluid has developed. Single right and dual left chest tubes are in place. No pneumothorax. Mild interstitial pulmonary edema has progressed slightly within the perihilar regions, likely cardiogenic in nature. Moderate cardiomegaly appears unchanged peer IMPRESSION: Interval removal of the a sternotomy wires compatible with given history of sternal debridement. Moderate loculated pleural effusion at the right apex, possibly the residua of sternal washout. Slight interval retraction of the left ventricular assist device still likely crossing the aortic valve plane. Progressive mild cardiogenic failure. Bilateral chest tubes in place.  No pneumothorax. Electronically Signed   By: Fidela Salisbury MD   On: 02/03/2020 05:27   DG Chest Port 1 View  Result Date: 02/01/2020 CLINICAL DATA:  Intubation.  Chest tube.  Open-heart surgery. EXAM: PORTABLE CHEST 1 VIEW COMPARISON:  01/31/2020 FINDINGS: Patient rotated to the left. Endotracheal tube, feeding tube, right IJ line, bilateral chest tubes in stable position. Impella device and cardiac pacer in stable position. Low lung volumes with bibasilar atelectasis/infiltrates. Similar findings on prior exam. No pleural effusion or pneumothorax. Surgical staples right chest. IMPRESSION: 1. Lines and tubes including bilateral chest tubes in stable position.  Impella device and cardiac pacer in stable position. 2. Low lung volumes with bibasilar atelectasis/infiltrates. Similar findings on prior exam. 3.  Stable cardiomegaly. Electronically Signed   By: Marcello Moores  Register   On: 02/01/2020 06:01   DG CHEST PORT 1 VIEW  Result Date: 01/31/2020 CLINICAL DATA:  Respiratory failure EXAM: PORTABLE CHEST 1 VIEW COMPARISON:  01/30/2020 FINDINGS: 0616 hours. The cardio pericardial silhouette is enlarged. Bilateral chest tubes evident without appreciable pneumothorax. Right IJ catheter tip overlies the mid to distal SVC. Right PICC line remains in place  with tip obscured. Impella device again noted. Vascular congestion with patchy basilar atelectasis or infiltrate. Telemetry leads overlie the chest. IMPRESSION: Stable exam. Cardiomegaly with patchy bilateral basilar predominant airspace disease. Electronically Signed   By: Misty Stanley M.D.   On: 01/31/2020 06:57   DG CHEST PORT 1 VIEW  Result Date: 01/30/2020 CLINICAL DATA:  Respiratory failure EXAM: PORTABLE CHEST 1 VIEW COMPARISON:  01/28/2020 FINDINGS: Support devices remain in place, unchanged. Bilateral chest tubes. No pneumothorax. Cardiomegaly with vascular congestion. Bibasilar opacities, likely atelectasis. IMPRESSION: Support devices stable.  No pneumothorax. Cardiomegaly, vascular congestion and bibasilar atelectasis. Electronically Signed   By: Rolm Baptise M.D.   On: 01/30/2020 14:15   DG CHEST PORT 1 VIEW  Result Date: 01/28/2020 CLINICAL DATA:  Central line placement. EXAM: PORTABLE CHEST 1 VIEW COMPARISON:  01/28/2020 at 5:20 a.m. and earlier studies. FINDINGS: Right internal jugular dual lumen central venous catheter has its tip in the lower superior vena cava. Endotracheal tube, enteric tube and bilateral chest tubes are stable. Impella device also stable. Lung base opacities are similar left and improved on the right from the earlier exam, consistent with atelectasis. No convincing pulmonary edema. No  pneumothorax. IMPRESSION: 1. Right internal jugular dual-lumen central venous catheter is well positioned, tip projecting in the lower superior vena cava. No pneumothorax. 2. Improved lung aeration since the earlier exam with partial clearing at the right lung base. Residual lung base opacities consistent with atelectasis. Electronically Signed   By: Lajean Manes M.D.   On: 01/28/2020 11:43   DG Chest Port 1 View  Result Date: 01/28/2020 CLINICAL DATA:  Open heart surgery EXAM: PORTABLE CHEST 1 VIEW COMPARISON:  01/27/2020 FINDINGS: Support devices remain in stable position. Bilateral chest tubes in place. No pneumothorax. Cardiomegaly with vascular congestion. Low volumes with bilateral airspace opacities, worsening since prior study, likely worsening edema and/or atelectasis. No acute bony abnormality. IMPRESSION: Stable support devices. Worsening bilateral airspace disease, likely a combination of edema and bibasilar atelectasis. Low lung volumes. Electronically Signed   By: Rolm Baptise M.D.   On: 01/28/2020 07:32   DG Chest Port 1 View  Result Date: 01/27/2020 CLINICAL DATA:  Hypoxia EXAM: PORTABLE CHEST 1 VIEW COMPARISON:  January 26, 2020 FINDINGS: Endotracheal tube tip is 4.6 cm above the carina. Swan-Ganz catheter tip is in the right main pulmonary artery. Impella device unchanged in position. Bilateral chest tubes present. Pacemaker lead attached to the right ventricle. No pneumothorax. There is mild bibasilar atelectasis. No edema or airspace opacity. Stable cardiomegaly with pulmonary vascularity within normal limits. No adenopathy. No bone lesions. IMPRESSION: Tube and catheter positions as described without pneumothorax. Stable cardiac prominence. Bibasilar atelectasis. No edema or consolidation. Electronically Signed   By: Lowella Grip III M.D.   On: 01/27/2020 07:55   DG CHEST PORT 1 VIEW  Result Date: 01/26/2020 CLINICAL DATA:  Hypoxia EXAM: PORTABLE CHEST 1 VIEW COMPARISON:   January 25, 2020 FINDINGS: Endotracheal tube tip is 3.8 cm above the carina. Swan-Ganz catheter tip is in the right main pulmonary artery. Enteric tube tip is below the diaphragm. Impella device unchanged in position. Right subclavian catheter tip in superior vena cava, stable. There is a left chest tube, unchanged in position. No pneumothorax. There is atelectatic change in the lung bases. No new opacity evident. There is cardiomegaly with pulmonary vascularity within normal limits. No adenopathy. Skin staples overlie the right axillary region. IMPRESSION: Tube and catheter positions as described without pneumothorax. Atelectasis in lower lung regions is  stable. Stable cardiomegaly. Electronically Signed   By: Lowella Grip III M.D.   On: 01/26/2020 08:16   DG Chest Port 1 View  Result Date: 01/24/2020 CLINICAL DATA:  Follow-up open heart surgery EXAM: PORTABLE CHEST 1 VIEW COMPARISON:  11/22/2019 FINDINGS: Endotracheal tube tip 2.5 cm above the carina. Orogastric or nasogastric tube enters the stomach. Bilateral chest tubes in place. Right internal jugular Swan-Ganz catheter with tip in the right main pulmonary artery. Left heart Impella device in place. Slightly less pulmonary edema than was seen yesterday. Mild atelectasis in the left lower lobe. No pneumothorax. IMPRESSION: Lines and tubes well positioned. Slightly less pulmonary edema. No pneumothorax. Electronically Signed   By: Nelson Chimes M.D.   On: 01/24/2020 09:02   DG Chest Port 1 View  Result Date: 01/22/2020 CLINICAL DATA:  Recent open heart surgery. EXAM: PORTABLE CHEST 1 VIEW COMPARISON:  01/21/2020 FINDINGS: Left-sided pacemaker unchanged. Right IJ Swan-Ganz catheter has tip over the origin of the right lower lobar pulmonary artery. Nasogastric tube coiled once over the stomach with tip over the gastric cardia. Endotracheal tube has tip 3.3 cm above the carina. Right-sided Impella device unchanged. Bilateral chest tubes unchanged.  Mediastinal drain present. Lungs are hypoinflated with continued hazy prominence of the perihilar vasculature suggesting mild vascular congestion. Possible linear atelectasis over the left midlung/retrocardiac region. No pneumothorax. Suggestion of skin fold over the left apex. Stable cardiomegaly. Remainder the exam is unchanged. IMPRESSION: 1. Stable cardiomegaly with suggestion of mild vascular congestion unchanged. Suggestion of linear atelectasis over the left midlung/retrocardiac region. 2. Tubes and lines as described. No pneumothorax. Electronically Signed   By: Marin Olp M.D.   On: 01/22/2020 08:39   DG Chest Portable 1 View  Result Date: 01/27/2020 CLINICAL DATA:  CABG, intubated EXAM: PORTABLE CHEST 1 VIEW COMPARISON:  01/28/2020 FINDINGS: Single frontal view of the chest demonstrates stable endotracheal tube tip at level of thoracic inlet. Enteric catheter passes below diaphragm tip excluded by collimation. Right-sided arterial line with Impella device unchanged. Right internal jugular flow directed central venous catheter tip overlies the main pulmonary outflow tract. Left subclavian catheter tip overlies the superior vena cava. Stable single lead pacer/AICD. Interval placement of median sternotomy wires. There are bilateral chest tubes overlying the lung bases. Cardiac silhouette is enlarged. Consolidation at the lung bases likely reflects atelectasis. No effusion or pneumothorax. IMPRESSION: 1. Support devices as above. 2. Bibasilar hypoventilatory changes.  No effusion or pneumothorax. Electronically Signed   By: Randa Ngo M.D.   On: 01/02/2020 16:02   DG CHEST PORT 1 VIEW  Result Date: 01/02/2020 CLINICAL DATA:  Open-heart surgery, open sternum EXAM: PORTABLE CHEST 1 VIEW COMPARISON:  Radiograph 01/18/2020, CT 10/14/2011 FINDINGS: *Endotracheal tube in stable position within the mid trachea approximately 3 cm from the carina. *Transesophageal tube tip terminates below the GE  junction, beyond the margins of imaging. *Right IJ approach Swan-Ganz catheter. Tip approximating the right pulmonary artery. *Right upper extremity arterial line with Impella device overlying the cardiac silhouette. Skin staples in over the right chest wall may be related to cut down. *Left chest wall battery pack with stable defibrillator lead towards the cardiac apex. *Mediastinal and left pleural drain in place. *Additional external support devices overlie the patient. Persistently low lung volumes with atelectatic changes and more hazy interstitial opacities in the mid to lower lungs likely reflecting edema. Some persistent coalescent retrocardiac opacity is also unchanged from prior. No pneumothorax. Suspect bilateral effusions, left greater than right. Sternal diastasis. IMPRESSION:  1. Lines and tubes as above. 2. Persistently low lung volumes with atelectatic changes, edema and more coalescent airspace opacity in the retrocardiac space. Electronically Signed   By: Lovena Le M.D.   On: 01/27/2020 06:40   DG Chest Port 1 View  Result Date: 01/18/2020 CLINICAL DATA:  Intubation.  Chest tube. EXAM: PORTABLE CHEST 1 VIEW COMPARISON:  01/17/2020. FINDINGS: Endotracheal tube, feeding tube, Swan-Ganz catheter, mediastinal drainage catheters, bilateral chest tubes in stable position. Impella device in stable position. Cardiac pacer in stable position. Stable severe cardiomegaly. Diffuse bilateral interstitial infiltrates/edema again noted without interim change. Persistent bibasilar atelectasis. Tiny left pleural effusion cannot be excluded. No prominent pleural effusion noted. No pneumothorax. Surgical clips right axilla. IMPRESSION: 1. Lines and tubes including bilateral chest tubes in stable position. No pneumothorax. 2. Cardiac pacer and Impella device in stable position. Stable severe cardiomegaly. 3. Diffuse bilateral interstitial infiltrates/edema again noted without interim change. Persistent  bibasilar atelectasis. Tiny left pleural effusion cannot be excluded. Electronically Signed   By: Marcello Moores  Register   On: 01/18/2020 06:56   DG Chest Port 1 View  Result Date: 01/17/2020 CLINICAL DATA:  Intubation.  Chest tube.  Open sternum. EXAM: PORTABLE CHEST 1 VIEW COMPARISON:  Chest x-ray 01/16/2020. FINDINGS: Endotracheal tube, feeding tube, Swan-Ganz catheter, bilateral chest tubes, mediastinal drainage tube in stable position. Cardiac pacer and Impella device in stable position. Stable cardiomegaly. Diffuse bilateral pulmonary infiltrates/edema again noted with slight improvement. Persistent bibasilar atelectasis again noted. No prominent pleural effusion. No pneumothorax. Surgical staples noted over right shoulder. IMPRESSION: 1. Lines and tubes including bilateral chest tubes in stable position. No pneumothorax. 2. Cardiac pacer and Impella device in stable position. Stable cardiomegaly. 3. Diffuse bilateral pulmonary infiltrates/edema again noted with slight improvement. Persistent bibasilar atelectasis again noted. Electronically Signed   By: Marcello Moores  Register   On: 01/17/2020 06:27   DG Chest Port 1 View  Result Date: 01/16/2020 CLINICAL DATA:  Chest tube in place. EXAM: PORTABLE CHEST 1 VIEW COMPARISON:  January 15, 2020 FINDINGS: The ETT is in good position. The feeding tube terminates below today's film. An acid the device remains. An Impella device is in stable position. Chest tubes are stable. No pneumothorax. Cardiomegaly. Opacity in left base may simply represent atelectasis. Stable mild congestive heart failure. IMPRESSION: 1. Support apparatus as above. 2. Persistent cardiomegaly and mild congestive heart failure. 3. Left basilar opacity, likely atelectasis. Electronically Signed   By: Dorise Bullion III M.D   On: 01/16/2020 11:02   DG Chest Port 1 View  Result Date: 01/15/2020 CLINICAL DATA:  Open cardiac surgery EXAM: PORTABLE CHEST 1 VIEW COMPARISON:  Chest radiograph from  one day prior. FINDINGS: Skin staples overlie the right axilla. Right axillary approach Impella device is stable with tip overlying the left ventricle. Stable configuration of single lead left subclavian ICD. Enteric tube enters stomach with the tip not seen on this image. Endotracheal tube tip is 3.7 cm above the carina. Stable bibasilar chest tubes and pericardial drain. Right internal jugular Swan-Ganz catheter terminates over the main pulmonary artery, stable. Stable cardiomediastinal silhouette with mild to moderate cardiomegaly. No pneumothorax. Low lung volumes. No definite pleural effusions. Mild pulmonary edema, similar. Left lung base opacities, similar. IMPRESSION: 1. Stable support structures. No pneumothorax. 2. Stable mild congestive heart failure. 3. Low lung volumes with left lung base opacities, favor atelectasis. Electronically Signed   By: Ilona Sorrel M.D.   On: 01/15/2020 11:47   DG Chest [facility] 1 View  Result  Date: 01/14/2020 CLINICAL DATA:  Hypoxia EXAM: PORTABLE CHEST 1 VIEW COMPARISON:  January 13, 2020. FINDINGS: Endotracheal tube tip is 4.2 cm above the carina. Nasogastric tube tip and side port are below the diaphragm. Impella device in place, unchanged. Swan-Ganz catheter tip is in the proximal right main pulmonary artery. Right subclavian catheter tip is in the superior vena cava. There is a chest tube on each side and a mediastinal drain. There is a pacemaker with lead tip attached to the right atrium. No appreciable pneumothorax. There is a small left pleural effusion with atelectatic change in the left base. There is also slight atelectasis in the right mid lung with minimal right pleural effusion. There is cardiomegaly with pulmonary vascularity normal. No adenopathy. No bone lesions. IMPRESSION: Tube and catheter positions as described without pneumothorax. Small pleural effusions bilaterally, larger on the left than on the right with atelectatic change in the left base and  right mid lung, more on the left than on the right. Stable cardiomegaly. Electronically Signed   By: Lowella Grip III M.D.   On: 01/14/2020 08:17   DG Chest Port 1 View  Result Date: 12/28/2019 CLINICAL DATA:  Check endotracheal tube placement, status post coronary bypass grafting EXAM: PORTABLE CHEST 1 VIEW COMPARISON:  01/26/2020 FINDINGS: Cardiac shadow is stable. Swan-Ganz catheter, Impella catheter, nasogastric catheter and endotracheal tube are again seen and stable. Defibrillator is again noted. Postsurgical changes are seen. Bilateral thoracostomy catheters as well as mediastinal drain are again noted and stable. Slight increased density is noted on the right consistent with an enlarging posteriorly layering effusion. Central vascular congestion is noted. IMPRESSION: Persistent central vascular congestion. Increasing right-sided pleural effusion. Tubes and lines as described. Electronically Signed   By: Inez Catalina M.D.   On: 01/19/2020 08:03   DG Chest Port 1 View  Result Date: 01/08/2020 CLINICAL DATA:  Post CABG. EXAM: PORTABLE CHEST 1 VIEW COMPARISON:  Radiograph yesterday. FINDINGS: Endotracheal tube tip is partially obscured but is approximately 3.6 cm from the carina. Enteric tube in place with tip and side-port below the diaphragm in the stomach. Impella from right subclavian approach again seen, unchanged in position with tip projecting over the left ventricle. Right internal jugular Swan-Ganz catheter tip in the region of the main pulmonary outflow tract. Mediastinal drains and left-sided chest tube in place. Left-sided pacemaker. Median sternotomy. New midline skin staples. There is new right upper lobe collapse. Cardiomegaly is similar to prior. Vascular congestion. No large pleural effusion. Streaky retrocardiac atelectasis. IMPRESSION: 1. New right upper lobe collapse. 2. Endotracheal tube tip partially obscured but is approximately 3.6 cm from the carina. Enteric tube in place  with tip and side-port below the diaphragm in the stomach. Remaining support apparatus unchanged. 3. Cardiomegaly and vascular congestion. Electronically Signed   By: Keith Rake M.D.   On: 12/28/2019 16:24   DG Abd Portable 1V  Result Date: 02/03/2020 CLINICAL DATA:  Check feeding catheter placement EXAM: PORTABLE ABDOMEN - 1 VIEW COMPARISON:  01/31/2020 FINDINGS: Feeding catheter is noted extending into the distal stomach. The overall appearance is similar to that seen on the prior exam. Scattered large and small bowel gas is seen. IMPRESSION: Feeding catheter in the distal stomach. Electronically Signed   By: Inez Catalina M.D.   On: 02/03/2020 09:44   DG Abd Portable 1V  Result Date: 01/24/2020 CLINICAL DATA:  Feeding tube placement EXAM: PORTABLE ABDOMEN - 1 VIEW COMPARISON:  01/21/2020 FINDINGS: Feeding tube tip in the region of the  gastric antrum or duodenal bulb. Normal bowel gas pattern. Left chest tube noted. IMPRESSION: Feeding tube tip in the region of the gastric antrum or duodenal bulb. Electronically Signed   By: Franchot Gallo M.D.   On: 01/24/2020 11:38   DG Abd Portable 1V  Result Date: 01/14/2020 CLINICAL DATA:  Feeding tube placement EXAM: PORTABLE ABDOMEN - 1 VIEW COMPARISON:  04/03/2011 FINDINGS: Feeding tube passes through the stomach with the tip near the duodenal bulb. Normal bowel gas pattern. Chest findings reported separately. IMPRESSION: Feeding tube tip in the region of the duodenal bulb. No dilated bowel loops. Electronically Signed   By: Franchot Gallo M.D.   On: 01/14/2020 11:29   ECHO TEE  Result Date: 02/26/2020    TRANSESOPHOGEAL ECHO REPORT   [name] Date of Exam: 01/21/2020 Medical Rec #:  [id number]     Height:       75.0 in Accession #:    [id number]    Weight:       235.7 lb Date of Birth:  Dec 07, 1970     BSA:          2.353 m Patient Age:    59 years      BP:           0/0 mmHg Patient Gender: M             HR:           117 bpm.  Exam Location:  Inpatient Procedure: Transesophageal Echo, Color Doppler and Cardiac Doppler Indications:     bi-ventricular failure.  History:         Patient has prior history of Echocardiogram examinations, most                  recent 01/10/2020. Cardiomyopathy, Prior CABG,                  Arrythmias:Atrial Fibrillation; Risk Factors:Dyslipidemia and                  Diabetes.  Sonographer:     Johny Chess Referring Phys:  [place] Diagnosing Phys: Loralie Champagne MD PROCEDURE: The transesophogeal probe was passed without difficulty through the esophogus of the patient. Sedation performed by different physician. The patient developed no complications during the procedure. IMPRESSIONS  1. Left ventricular ejection fraction, by estimation, is <20%. The left ventricle has severely decreased function. Diffuse hypokinesis with septal dyskinesis. The left ventricular internal cavity size was moderately dilated. Impella catheter in place at  about 5.3 cm from aortic valve.  2. Right ventricular systolic function is severely reduced. The right ventricular size mildly dilated.  3. Moderate left atrial enlargement, no LA appendage thrombus.  4. No evidence of mitral stenosis. Mild mitral regurgitation.  5. No aortic stenosis is present. FINDINGS  Left Ventricle: Left ventricular ejection fraction, by estimation, is <20%. The left ventricle has severely decreased function. The left ventricle demonstrates global hypokinesis. The left ventricular internal cavity size was moderately dilated. There is no left ventricular hypertrophy. Right Ventricle: The right ventricular size is normal. Right ventricular systolic function is mildly reduced. Mitral Valve: No evidence of mitral valve stenosis. Aortic Valve: No aortic stenosis is present. Additional Comments: A pacer wire is visualized in the right ventricle. Loralie Champagne MD Electronically signed by Loralie Champagne MD Signature Date/Time: 02/03/2020/3:54:09 PM     Final    ECHO INTRAOPERATIVE TEE  Result Date: 01/26/2020  *INTRAOPERATIVE TRANSESOPHAGEAL REPORT *  [name]  Date of Exam: 01/03/2020 Medical Rec #:  [id number]      Height:       75.0 in Accession #:    [id number]     Weight:       248.9 lb Date of Birth:  1970/04/16      BSA:          2.41 m Patient Age:    5 years       BP:           none/none mmHg Patient Gender: M              HR:           None bpm. Exam Location:  Anesthesiology Transesophogeal exam was perform intraoperatively during surgical procedure. Patient was closely monitored under general anesthesia during the entirety of examination. Indications:     I50.9* Heart failure (unspecified) Performing Phys: [id number] PETER VAN TRIGT Complications: No known complications during this procedure. POST-OP IMPRESSIONS - Left Ventricle: The left ventricle is unchanged from pre-bypass. - Aorta: The aorta appears unchanged from pre-bypass. - Left Atrial Appendage: The left atrial appendage appears unchanged from pre-bypass. - Aortic Valve: The aortic valve appears unchanged from pre-bypass. - Mitral Valve: The mitral valve appears unchanged from pre-bypass. - Pericardium: The pericardium appears unchanged from pre-bypass. PRE-OP FINDINGS  Left Ventricle: The left ventricle has severely reduced systolic function, with an ejection fraction of 20-25%. The cavity size was moderately dilated. There is mildly increased left ventricular wall thickness. Right Ventricle: The right ventricle has normal systolic function. The cavity was mildly enlarged. There is no increase in right ventricular wall thickness. Left Atrium: Left atrial size was normal in size. Right Atrium: Right atrial size was normal in size. Interatrial Septum: No atrial level shunt detected by color flow Doppler. Pericardium: There is no evidence of pericardial effusion. Mitral Valve: The mitral valve is normal in structure. Mitral valve regurgitation is mild by color flow Doppler.  The MR jet is centrally-directed. Tricuspid Valve: The tricuspid valve was normal in structure. Tricuspid valve regurgitation was not visualized by color flow Doppler. Aortic Valve: The aortic valve was not assessed Aortic valve regurgitation was not assessed by color flow Doppler. Impella in situ across Aortic valve. 5.1cm tip to valve. unable to ases Aortic Valve function. Pulmonic Valve: The pulmonic valve was not assessed. Pulmonic valve regurgitation was not assessed by color flow Doppler. Aorta: The aorta was not well visualized.  Lillia Abed MD Electronically signed by Lillia Abed MD Signature Date/Time: 01/26/2020/10:38:01 AM    Final    ECHO INTRAOPERATIVE TEE  Result Date: 01/08/2020  *INTRAOPERATIVE TRANSESOPHAGEAL REPORT *  [name]  Date of Exam: 01/15/2020 Medical Rec #:  [id number]      Height:       75.0 in Accession #:    [id number]     Weight:       235.7 lb Date of Birth:  08-23-1970      BSA:          2.35 m Patient Age:    89 years       BP:           108/93 mmHg Patient Gender: M              HR:           85 bpm. Exam Location:  Anesthesiology Transesophogeal exam was perform intraoperatively during surgical  procedure. Patient was closely monitored under general anesthesia during the entirety of examination. Indications:     Coronary Artery Disease Sonographer:     Bernadene Person RDCS Performing Phys: Adele Barthel MD Diagnosing Phys: Adele Barthel MD Complications: No known complications during this procedure. POST-OP IMPRESSIONS - Left Ventricle: The left ventricle has severely reduced systolic function. It is slightly improved from pre-bypass. Impella in good position. - Aorta: The aorta appears unchanged from pre-bypass. - Left Atrial Appendage: The left atrial appendage appears unchanged from pre-bypass. - Aortic Valve: The aortic valve appears unchanged from pre-bypass. - Mitral Valve: The mitral valve appears unchanged from pre-bypass. - Tricuspid Valve: There  is moderate tricuspid regurgitation. - Interatrial Septum: The interatrial septum appears unchanged from pre-bypass. - Pericardium: The pericardium appears unchanged from pre-bypass. PRE-OP FINDINGS  Left Ventricle: The left ventricle has a 2D calculated ejection fraction of 15%. The cavity size was moderately dilated. There is no increase in left ventricular wall thickness. Right Ventricle: The right ventricle has moderately reduced systolic function. The cavity was normal. There is no increase in right ventricular wall thickness. Left Atrium: Left atrial size was dilated. The left atrial appendage is well visualized and there is no evidence of thrombus present. Right Atrium: Right atrial size was normal in size. Interatrial Septum: No atrial level shunt detected by color flow Doppler. Pericardium: Trivial pericardial effusion is present. Mitral Valve: The mitral valve is normal in structure. Mitral valve regurgitation is mild by color flow Doppler. Tricuspid Valve: The tricuspid valve was normal in structure. Tricuspid valve regurgitation is mild by color flow Doppler. Aortic Valve: The aortic valve is tricuspid. Aortic valve regurgitation is trivial by color flow Doppler. Impella seen crossing the aortic valve. Pulmonic Valve: The pulmonic valve was normal in structure. Pulmonic valve regurgitation is not visualized by color flow Doppler. Aorta: The aortic arch are normal in size and structure. Impella visualized in the aortic root.  Adele Barthel MD Electronically signed by Adele Barthel MD Signature Date/Time: 01/27/2020/4:31:15 PM    Final    ECHOCARDIOGRAM LIMITED  Result Date: 02/04/2020    ECHOCARDIOGRAM LIMITED REPORT   [name] Date of Exam: 02/04/2020 Medical Rec #:  [id number]     Height:       75.0 in Accession #:    [id number]    Weight:       239.6 lb Date of Birth:  [birth year]     BSA:          2.370 m Patient Age:    19 years      BP:           92/62 mmHg Patient Gender: M              HR:           93 bpm. Exam Location:  Inpatient Procedure: Limited Echo Indications:    impella placement check  History:        Patient has prior history of Echocardiogram examinations, most                 recent 02/03/2020.  Sonographer:    Johny Chess Referring Phys: [place]  1. Left ventricular ejection fraction, by estimation, is 20%. The left ventricle demonstrates global hypokinesis with septal dyskinesis. The left ventricular internal cavity size was mildly dilated. Impella catheter present, tip is at 4.6 cm in the left  ventricle. Position was not adjusted.  2. Right ventricular  systolic function is moderately reduced. Normal right ventricular size.  3. Limited echo for Impella position. FINDINGS  Left Ventricle: Left ventricular ejection fraction, by estimation, is 20%. The left ventricle demonstrates global hypokinesis. The left ventricular internal cavity size was mildly dilated. There is no left ventricular hypertrophy. Right Ventricle: Right ventricular systolic function is moderately reduced. Loralie Champagne MD Electronically signed by Loralie Champagne MD Signature Date/Time: 02/04/2020/3:57:57 PM    Final    ECHOCARDIOGRAM LIMITED  Result Date: 02/03/2020    ECHOCARDIOGRAM LIMITED REPORT   [name] Date of Exam: 02/03/2020 Medical Rec #:  [id number]     Height:       75.0 in Accession #:    [id number]    Weight:       234.8 lb Date of Birth:  09/25/1970     BSA:          2.349 m Patient Age:    74 years      BP:           82/63 mmHg Patient Gender: M             HR:           102 bpm. Exam Location:  Inpatient Procedure: Limited Echo Indications:    Impella position  History:        Patient has prior history of Echocardiogram examinations, most                 recent 02/17/2020. CHF and Cardiomyopathy, CAD, Pacemaker,                 Arrythmias:Atrial Fibrillation, Signs/Symptoms:Shortness of                 Breath and Chest Pain; Risk  Factors:Non-Smoker, Hypertension,                 Diabetes and Sleep Apnea. GERD.  Sonographer:    Vickie Epley RDCS Referring Phys: [place]  1. Limited study for Impella repositioning. Distance of the Impella tip to aortic valve was shortened, now 3.4 cm.  2. Left ventricular ejection fraction, by estimation, is <20%. The left ventricle has severely decreased function. The left ventricle demonstrates global hypokinesis. FINDINGS  Left Ventricle: Left ventricular ejection fraction, by estimation, is <20%. The left ventricle has severely decreased function. The left ventricle demonstrates global hypokinesis. Abnormal (paradoxical) septal motion, consistent with left bundle branch block. Ena Dawley MD Electronically signed by Ena Dawley MD Signature Date/Time: 02/03/2020/2:18:45 PM    Final    ECHOCARDIOGRAM LIMITED  Result Date: 02/27/2020    ECHOCARDIOGRAM LIMITED REPORT   [name] Date of Exam: 02/25/2020 Medical Rec #:  [id number]     Height:       75.0 in Accession #:    [id number]    Weight:       224.4 lb Date of Birth:  05/15/1970     BSA:          2.305 m Patient Age:    12 years      BP:           96/80 mmHg Patient Gender: M             HR:           85 bpm. Exam Location:  Inpatient Procedure: Limited Echo Indications:    CHF-Acute Systolic H63.14 impella placement  History:  Patient has prior history of Echocardiogram examinations, most                 recent 01/28/2020. Prior CABG and Defibrillator,                 Arrythmias:Atrial Fibrillation; Risk Factors:Non-Smoker and                 Diabetes. Cardiogenic shock.  Sonographer:    Leavy Cella Referring Phys: [place]  1. 2D echo done for Impella placement. Final placement demonstrates tip of catheter 4.42cm from aortic valve cusps.. Left ventricular ejection fraction, by estimation, is 20 to 25%. The left ventricle has severely decreased function. The left  ventricle demonstrates global hypokinesis. FINDINGS  Left Ventricle: 2D echo done for Impella placement. Final placement demonstrates tip of catheter 4.42cm from aortic valve cusps. Left ventricular ejection fraction, by estimation, is 20 to 25%. The left ventricle has severely decreased function. The left ventricle demonstrates global hypokinesis. Fransico Him MD Electronically signed by Fransico Him MD Signature Date/Time: 02/22/2020/12:36:36 PM    Final    ECHOCARDIOGRAM LIMITED  Result Date: 01/28/2020    ECHOCARDIOGRAM LIMITED REPORT   [name] Date of Exam: 01/28/2020 Medical Rec #:  [id number]     Height:       75.0 in Accession #:    [id number]    Weight:       235.7 lb Date of Birth:  22-Dec-1970     BSA:          2.353 m Patient Age:    21 years      BP:           93/60 mmHg Patient Gender: M             HR:           88 bpm. Exam Location:  Inpatient Procedure: Limited Echo Indications:    CHF  History:        Patient has prior history of Echocardiogram examinations, most                 recent 01/28/2020. CHF, CAD, Prior CABG and Defibrillator,                 Arrythmias:Atrial Fibrillation; Risk Factors:Diabetes. Impella.  Sonographer:    Dustin Flock Referring Phys: [place] Comments: Impella positioning. IMPRESSIONS  1. Left ventricular ejection fraction, by estimation, is 20%. The left ventricle demonstrates global hypokinesis with septal dyskinesis. The left ventricular internal cavity size was moderately dilated. Impella catheter noted at 5.37 cm.  2. Right ventricular systolic function is moderately reduced. The right ventricular size is normal.  3. Left atrial size was mildly dilated.  4. Right atrial size was mildly dilated.  5. Limited echo FINDINGS  Left Ventricle: Left ventricular ejection fraction, by estimation, is 20%. The left ventricle demonstrates global hypokinesis. The left ventricular internal cavity size was moderately dilated.  There is no left ventricular hypertrophy. Right Ventricle: The right ventricular size is normal. Right ventricular systolic function is moderately reduced. Left Atrium: Left atrial size was mildly dilated. Right Atrium: Right atrial size was mildly dilated. Additional Comments: A pacer wire is visualized in the right ventricle. Loralie Champagne MD Electronically signed by Loralie Champagne MD Signature Date/Time: 01/28/2020/12:13:26 PM    Final    ECHOCARDIOGRAM LIMITED  Result Date: 01/23/2020    ECHOCARDIOGRAM LIMITED REPORT   Patient Name:  Corey Palmer Date of Exam: 01/02/2020 Medical Rec #:  [id number]     Height:       75.0 in Accession #:    [id number]    Weight:       242.3 lb Date of Birth:  01-05-1971     BSA:          2.381 m Patient Age:    65 years      BP:           0/0 mmHg Patient Gender: M             HR:           115 bpm. Exam Location:  [facility] Procedure: Limited Echo Indications:    CHF-Acute Systolic Q76.22  History:        Patient has prior history of Echocardiogram examinations, most                 recent 01/19/2020. CHF, Defibrillator, Arrythmias:Atrial                 Fibrillation, Signs/Symptoms:Chest Pain; Risk                 Factors:Dyslipidemia, Diabetes and Hypertension. Cardiogenic                 Shock.  Sonographer:    Leavy Cella Referring Phys: [place] study for Impella cannula positioning, which is approximately 5 cm across the sortic valve. Severely depressed left ventricular function. Sanda  MD Electronically signed by Sanda  MD Signature Date/Time: 01/14/2020/10:15:28 AM    Final    ECHOCARDIOGRAM LIMITED  Result Date: 01/19/2020    ECHOCARDIOGRAM LIMITED REPORT   [name] Date of Exam: 01/19/2020 Medical Rec #:  [id number]     Height:       75.0 in Accession #:    [id number]    Weight:       257.5 lb Date of Birth:  26-Jul-1970     BSA:          2.443 m Patient Age:    70 years      BP:           64/54 mmHg  Patient Gender: M             HR:           114 bpm. Exam Location:  Inpatient Procedure: Limited Echo, Color Doppler and Cardiac Doppler Indications:    I50.9* Heart failure (unspecified)  History:        Patient has prior history of Echocardiogram examinations, most                 recent 01/18/2020. CHF, Prior CABG and Pacemaker,                 Arrythmias:Atrial Fibrillation; Risk Factors:Hypertension,                 Diabetes, Dyslipidemia and Sleep Apnea. Impella 5.5 placed                 01/16/2020.  Sonographer:    Raquel Sarna Senior RDCS Referring Phys: [id number] Carlene Coria  Sonographer Comments: Limited to evaluate RV function, Adam (Impella rep) present for exam. IMPRESSIONS  1. RV size appears normal but there is severely reduced RV function. Severe RV dysfunction was present on the prior study. Right ventricular systolic function is severely reduced. The right ventricular  size is normal.  2. Impella present in the LV, 5.6 cm from the AoV. The LV is dilated with septal bowing into the RV. Trace MR. Left ventricular ejection fraction, by estimation, is 10-15%. The left ventricle demonstrates global hypokinesis. The left ventricular internal cavity size was moderately dilated.  3. Left atrial size was mildly dilated.  4. The mitral valve is grossly normal. Trivial mitral valve regurgitation. No evidence of mitral stenosis. Comparison(s): No significant change from prior study. FINDINGS  Left Ventricle: Impella present in the LV, 5.6 cm from the AoV. The LV is dilated with septal bowing into the RV. Trace MR. Left ventricular ejection fraction, by estimation, is 10-15%. The left ventricle demonstrates global hypokinesis. The left ventricular internal cavity size was moderately dilated. Right Ventricle: RV size appears normal but there is severely reduced RV function. Severe RV dysfunction was present on the prior study. The right ventricular size is normal. Right ventricular systolic function is severely  reduced. Left Atrium: Left atrial size was mildly dilated. Right Atrium: Right atrial size was normal in size. Mitral Valve: The mitral valve is grossly normal. Trivial mitral valve regurgitation. No evidence of mitral valve stenosis. Tricuspid Valve: The tricuspid valve is grossly normal. Tricuspid valve regurgitation is trivial. No evidence of tricuspid stenosis. Venous: IVC assessment for right atrial pressure unable to be performed due to mechanical ventilation. Additional Comments: A pacer wire is visualized in the right atrium and right ventricle. RIGHT VENTRICLE RV S prime:     4.58 cm/s TAPSE (M-mode): 0.6 cm TRICUSPID VALVE TR Peak grad:   13.4 mmHg TR Vmax:        183.00 cm/s Eleonore Chiquito MD Electronically signed by Eleonore Chiquito MD Signature Date/Time: 01/19/2020/11:53:14 AM    Final    ECHOCARDIOGRAM LIMITED  Result Date: 01/18/2020    ECHOCARDIOGRAM LIMITED REPORT   [name] Date of Exam: 01/18/2020 Medical Rec #:  [id number]     Height:       75.0 in Accession #:    [id number]    Weight:       257.9 lb Date of Birth:  1970/07/28     BSA:          2.445 m Patient Age:    70 years      BP:           93/67 mmHg Patient Gender: M             HR:           115 bpm. Exam Location:  Inpatient Procedure: Limited Echo Indications:    CHF, Impella position  History:        Patient has prior history of Echocardiogram examinations, most                 recent 01/14/2020. CHF, CAD, Prior CABG and Defibrillator;                 Arrythmias:Atrial Fibrillation. Impella, Cardiogenic shock.  Sonographer:    Dustin Flock Referring Phys: [phone] Wil[place] Floor [place]  Sonographer Comments: Impella positioning. IMPRESSIONS  1. Limited echo for Impella positioning. Initially the device was noted to be too far advanced in the LV, measuring 6.65 cm distal to the aortic valve near the posteromedial papillary muscle. It was repositioned to a final measurement of 5.5 cm distal to the aortic valve in the  2 chamber view (ideal position 3.5 cm). Left ventricular ejection fraction, by estimation, is <20%.  The left ventricle has severely decreased function. The left ventricle demonstrates global hypokinesis. Comparison(s): Changes from prior study are noted. 01/14/2020: LVEF <20%, impella position 4.6 cm distal to the aortic cusps. FINDINGS  Left Ventricle: Limited echo for Impella positioning. Initially the device was noted to be too far advanced in the LV, measuring 6.65 cm distal to the aortic valve near the posteromedial papillary muscle. It was repositioned to a final measurement of 5.5 cm distal to the aortic valve in the 2 chamber view (ideal position 3.5 cm). Left ventricular ejection fraction, by estimation, is <20%. The left ventricle has severely decreased function. The left ventricle demonstrates global hypokinesis. Lyman Bishop MD Electronically signed by Lyman Bishop MD Signature Date/Time: 01/18/2020/10:47:59 AM    Final    ECHOCARDIOGRAM LIMITED  Result Date: 01/14/2020    ECHOCARDIOGRAM LIMITED REPORT   [name] Date of Exam: 01/14/2020 Medical Rec #:  [id number]     Height:       75.0 in Accession #:    [id number]    Weight:       275.6 lb Date of Birth:  1970-05-02     BSA:          2.515 m Patient Age:    47 years      BP:           107/27 mmHg Patient Gender: M             HR:           115 bpm. Exam Location:  Inpatient Procedure: Limited Echo                                 MODIFIED REPORT: This report was modified by Buford Dresser MD on 01/14/2020 due to signed                                   prematurely.  Indications:     Impella position  History:         Patient has prior history of Echocardiogram examinations, most                  recent 01/18/2020. CHF and Cardiomyopathy, CAD, Prior CABG,                  Arrythmias:Atrial Fibrillation; Risk Factors:Hypertension,                  Diabetes, Dyslipidemia and Non-Smoker.  Sonographer:     Vickie Epley RDCS  Referring Phys:  [place] Diagnosing Phys: Buford Dresser MD  Conclusion(s)/Recommendation(s): Limited study for impella placement. Impella noted as 4.6 cm from aortic cusps. EF severely reduced at <20%. Catheter noted in RA and RV, likely [place].    Final (Updated)     Tele reviewed>> freq ventricular ectopy  Assessment and Plan:  VTNS  Shock -- impella support NorEpi. Milrinone and mitodrine w  RV failure  CAD s/p CABG open chest  Afib recurrent  CVVH  Increased VT burden  Discussed with Dr DB--unlikely in the absence of suction alarms to be mechanical 2/2 implella and so must presume electromechanical instability which is not a surprise in this tragic situation.   Will use amio bolus and increase drip overnight  Have done the above and as of 2 hrs later telemtry  now quiet  ICD interrogated, single chamber [employer] without therapy and changes in egram morphology to support the impression of VT   Anticipate decreasing amio 30 mg in am       Virl Axe

## 2020-02-10 ENCOUNTER — Inpatient Hospital Stay (HOSPITAL_COMMUNITY): Payer: BC Managed Care – PPO

## 2020-02-10 DIAGNOSIS — I4891 Unspecified atrial fibrillation: Secondary | ICD-10-CM | POA: Diagnosis not present

## 2020-02-10 DIAGNOSIS — R570 Cardiogenic shock: Secondary | ICD-10-CM | POA: Diagnosis not present

## 2020-02-10 DIAGNOSIS — Z95811 Presence of heart assist device: Secondary | ICD-10-CM | POA: Diagnosis not present

## 2020-02-10 LAB — LACTATE DEHYDROGENASE: LDH: 410 U/L — ABNORMAL HIGH (ref 98–192)

## 2020-02-10 LAB — CBC
HCT: 28.8 % — ABNORMAL LOW (ref 39.0–52.0)
Hemoglobin: 8.9 g/dL — ABNORMAL LOW (ref 13.0–17.0)
MCH: 27.8 pg (ref 26.0–34.0)
MCHC: 30.9 g/dL (ref 30.0–36.0)
MCV: 90 fL (ref 80.0–100.0)
Platelets: 322 10*3/uL (ref 150–400)
RBC: 3.2 MIL/uL — ABNORMAL LOW (ref 4.22–5.81)
RDW: 20.1 % — ABNORMAL HIGH (ref 11.5–15.5)
WBC: 16.5 10*3/uL — ABNORMAL HIGH (ref 4.0–10.5)
nRBC: 0 % (ref 0.0–0.2)

## 2020-02-10 LAB — GLUCOSE, CAPILLARY
Glucose-Capillary: 185 mg/dL — ABNORMAL HIGH (ref 70–99)
Glucose-Capillary: 178 mg/dL — ABNORMAL HIGH (ref 70–99)
Glucose-Capillary: 185 mg/dL — ABNORMAL HIGH (ref 70–99)
Glucose-Capillary: 180 mg/dL — ABNORMAL HIGH (ref 70–99)
Glucose-Capillary: 174 mg/dL — ABNORMAL HIGH (ref 70–99)
Glucose-Capillary: 201 mg/dL — ABNORMAL HIGH (ref 70–99)
Glucose-Capillary: 201 mg/dL — ABNORMAL HIGH (ref 70–99)
Glucose-Capillary: 187 mg/dL — ABNORMAL HIGH (ref 70–99)
Glucose-Capillary: 197 mg/dL — ABNORMAL HIGH (ref 70–99)
Glucose-Capillary: 179 mg/dL — ABNORMAL HIGH (ref 70–99)
Glucose-Capillary: 181 mg/dL — ABNORMAL HIGH (ref 70–99)
Glucose-Capillary: 180 mg/dL — ABNORMAL HIGH (ref 70–99)
Glucose-Capillary: 198 mg/dL — ABNORMAL HIGH (ref 70–99)
Glucose-Capillary: 194 mg/dL — ABNORMAL HIGH (ref 70–99)
Glucose-Capillary: 194 mg/dL — ABNORMAL HIGH (ref 70–99)
Glucose-Capillary: 176 mg/dL — ABNORMAL HIGH (ref 70–99)
Glucose-Capillary: 189 mg/dL — ABNORMAL HIGH (ref 70–99)
Glucose-Capillary: 196 mg/dL — ABNORMAL HIGH (ref 70–99)
Glucose-Capillary: 176 mg/dL — ABNORMAL HIGH (ref 70–99)

## 2020-02-10 LAB — BPAM RBC
Blood Product Expiration Date: 202201312359
ISSUE DATE / TIME: 202201121126
Unit Type and Rh: 6200

## 2020-02-10 LAB — COOXEMETRY PANEL
Carboxyhemoglobin: 1.8 % — ABNORMAL HIGH (ref 0.5–1.5)
Carboxyhemoglobin: 1.7 % — ABNORMAL HIGH (ref 0.5–1.5)
Methemoglobin: 1.2 % (ref 0.0–1.5)
Methemoglobin: 1.3 % (ref 0.0–1.5)
O2 Saturation: 82.4 %
O2 Saturation: 79.3 %
Total hemoglobin: 6.7 g/dL — CL (ref 12.0–16.0)
Total hemoglobin: 8.8 g/dL — ABNORMAL LOW (ref 12.0–16.0)

## 2020-02-10 LAB — CBC WITH DIFFERENTIAL/PLATELET
Abs Immature Granulocytes: 0.25 10*3/uL — ABNORMAL HIGH (ref 0.00–0.07)
Basophils Absolute: 0.1 10*3/uL (ref 0.0–0.1)
Basophils Relative: 1 %
Eosinophils Absolute: 0.7 10*3/uL — ABNORMAL HIGH (ref 0.0–0.5)
Eosinophils Relative: 4 %
HCT: 28.9 % — ABNORMAL LOW (ref 39.0–52.0)
Hemoglobin: 9.1 g/dL — ABNORMAL LOW (ref 13.0–17.0)
Immature Granulocytes: 2 %
Lymphocytes Relative: 6 %
Lymphs Abs: 0.9 10*3/uL (ref 0.7–4.0)
MCH: 28.2 pg (ref 26.0–34.0)
MCHC: 31.5 g/dL (ref 30.0–36.0)
MCV: 89.5 fL (ref 80.0–100.0)
Monocytes Absolute: 2.1 10*3/uL — ABNORMAL HIGH (ref 0.1–1.0)
Monocytes Relative: 13 %
Neutro Abs: 11.5 10*3/uL — ABNORMAL HIGH (ref 1.7–7.7)
Neutrophils Relative %: 74 %
Platelets: 292 10*3/uL (ref 150–400)
RBC: 3.23 MIL/uL — ABNORMAL LOW (ref 4.22–5.81)
RDW: 19.8 % — ABNORMAL HIGH (ref 11.5–15.5)
WBC: 15.4 10*3/uL — ABNORMAL HIGH (ref 4.0–10.5)
nRBC: 0 % (ref 0.0–0.2)

## 2020-02-10 LAB — RENAL FUNCTION PANEL
Albumin: 1.7 g/dL — ABNORMAL LOW (ref 3.5–5.0)
Albumin: 1.7 g/dL — ABNORMAL LOW (ref 3.5–5.0)
Anion gap: 9 (ref 5–15)
Anion gap: 8 (ref 5–15)
BUN: 28 mg/dL — ABNORMAL HIGH (ref 6–20)
BUN: 29 mg/dL — ABNORMAL HIGH (ref 6–20)
CO2: 23 mmol/L (ref 22–32)
CO2: 25 mmol/L (ref 22–32)
Calcium: 7.9 mg/dL — ABNORMAL LOW (ref 8.9–10.3)
Calcium: 7.8 mg/dL — ABNORMAL LOW (ref 8.9–10.3)
Chloride: 101 mmol/L (ref 98–111)
Chloride: 103 mmol/L (ref 98–111)
Creatinine, Ser: 1.51 mg/dL — ABNORMAL HIGH (ref 0.61–1.24)
Creatinine, Ser: 1.42 mg/dL — ABNORMAL HIGH (ref 0.61–1.24)
GFR, Estimated: 56 mL/min — ABNORMAL LOW (ref >60)
GFR, Estimated: 60 mL/min — ABNORMAL LOW (ref >60)
Glucose, Bld: 191 mg/dL — ABNORMAL HIGH (ref 70–99)
Glucose, Bld: 183 mg/dL — ABNORMAL HIGH (ref 70–99)
Phosphorus: 2.6 mg/dL (ref 2.5–4.6)
Phosphorus: 2 mg/dL — ABNORMAL LOW (ref 2.5–4.6)
Potassium: 4.2 mmol/L (ref 3.5–5.1)
Potassium: 4.5 mmol/L (ref 3.5–5.1)
Sodium: 133 mmol/L — ABNORMAL LOW (ref 135–145)
Sodium: 136 mmol/L (ref 135–145)

## 2020-02-10 LAB — TYPE AND SCREEN
ABO/RH(D): A POS
Antibody Screen: NEGATIVE
Unit division: 0

## 2020-02-10 LAB — HEPARIN LEVEL (UNFRACTIONATED): Heparin Unfractionated: <0.1 IU/mL — ABNORMAL LOW (ref 0.30–0.70)

## 2020-02-10 LAB — MAGNESIUM: Magnesium: 2.5 mg/dL — ABNORMAL HIGH (ref 1.7–2.4)

## 2020-02-10 MED ORDER — MIDAZOLAM HCL 2 MG/2ML IJ SOLN
INTRAMUSCULAR | Status: AC
  Administered 2020-02-10: 2 mg via INTRAVENOUS
  Filled 2020-02-10: qty 2

## 2020-02-10 MED ORDER — MIDAZOLAM HCL (PF) 10 MG/2ML IJ SOLN: 2.0000 mg | INTRAMUSCULAR | Status: DC | PRN

## 2020-02-10 MED ORDER — AMIODARONE LOAD VIA INFUSION
150.0000 mg | Freq: Once | INTRAVENOUS | Status: AC
  Administered 2020-02-10: 150 mg via INTRAVENOUS
  Filled 2020-02-10: qty 83.34

## 2020-02-10 MED ORDER — MIDAZOLAM HCL 2 MG/2ML IJ SOLN
2.0000 mg | INTRAMUSCULAR | Status: DC | PRN
  Administered 2020-02-10 – 2020-03-04 (×18): 2 mg via INTRAVENOUS
  Filled 2020-02-10 (×22): qty 2

## 2020-02-10 MED ORDER — HYDROMORPHONE BOLUS VIA INFUSION
1.0000 mg | INTRAVENOUS | Status: DC | PRN
  Administered 2020-02-11 – 2020-02-24 (×10): 1 mg via INTRAVENOUS
  Filled 2020-02-10: qty 1

## 2020-02-10 MED ORDER — LIP MEDEX EX OINT
TOPICAL_OINTMENT | CUTANEOUS | Status: DC | PRN
  Administered 2020-02-19: 1 via TOPICAL
  Filled 2020-02-10: qty 7

## 2020-02-10 NOTE — Progress Notes (Addendum)
Electrophysiology Rounding Note  Patient Name: Corey Palmer Date of Encounter: 02/10/2020  Primary Cardiologist: No primary care provider on file. Electrophysiologist: Cristopher Peru, MD  Subjective   Noted to have QRS changes 1/11 am with coughing with RBBB and LBBB, LBBB with worse hemodynamics  Evening of 1/12 began to have recurrent runs of WCT, most consistent with VT that was non-perfusing  Pt is awake and alert this am. Nods heads to questions  Inpatient Medications    Scheduled Meds:  amiodarone  150 mg Intravenous Once   vitamin C  500 mg Per Tube TID   aspirin  324 mg Per Tube Daily   atorvastatin  80 mg Per Tube Daily   busPIRone  10 mg Per Tube TID   chlorhexidine gluconate (MEDLINE KIT)  15 mL Mouth Rinse BID   Chlorhexidine Gluconate Cloth  6 each Topical Daily   clonazepam  0.25 mg Per Tube BID   darbepoetin (ARANESP) injection - NON-DIALYSIS  100 mcg Subcutaneous Q Sat-1800   docusate  100 mg Per Tube BID   feeding supplement (PROSource TF)  45 mL Per Tube TID   fiber  1 packet Per Tube BID   FLUoxetine  40 mg Per Tube BID   lidocaine  1 patch Transdermal Q24H   mouth rinse  15 mL Mouth Rinse 10 times per day   melatonin  5 mg Per Tube Daily   midodrine  15 mg Per Tube TID WC   multivitamin  1 tablet Per Tube QHS   oxyCODONE  10 mg Per Tube Q6H   pantoprazole (PROTONIX) IV  40 mg Intravenous Q12H   polyethylene glycol  17 g Per Tube Daily   sodium chloride flush  10-40 mL Intracatheter Q12H   thiamine  100 mg Per Tube Daily   valproic acid  250 mg Per Tube BID   Continuous Infusions:   prismasol BGK 4/2.5 500 mL/hr at 02/10/20 0259    prismasol BGK 4/2.5 200 mL/hr at 02/09/20 2252   sodium chloride Stopped (01/29/20 0955)   sodium chloride     amiodarone 60 mg/hr (02/10/20 0705)   electrolyte-A Stopped (02/03/20 0702)   epinephrine Stopped (02/08/20 0651)   feeding supplement (PIVOT 1.5 CAL) 60 mL/hr at 02/10/20 0038   impella catheter heparin 50  unit/mL in dextrose 5%     HYDROmorphone 2 mg/hr (02/10/20 0700)   insulin 3.6 mL/hr at 02/10/20 0700   lactated ringers 20 mL/hr at 02/10/20 0700   milrinone 0.125 mcg/kg/min (02/10/20 0700)   norepinephrine (LEVOPHED) Adult infusion 27 mcg/min (02/10/20 0719)   prismasol BGK 4/2.5 1,500 mL/hr at 02/10/20 0634   vasopressin 0.04 Units/min (02/10/20 0700)   PRN Meds: sodium chloride, Place/Maintain arterial line **AND** sodium chloride, artificial tears, bisacodyl **OR** bisacodyl, docusate, heparin, heparin, HYDROmorphone, metoprolol tartrate, polyethylene glycol, sodium chloride flush   Vital Signs    Vitals:   02/10/20 0615 02/10/20 0630 02/10/20 0645 02/10/20 0700  BP:    106/78  Pulse: 82 79 78 83  Resp:      Temp:      TempSrc:      SpO2: 99% 99% 99% 100%  Weight:      Height:        Intake/Output Summary (Last 24 hours) at 02/10/2020 0721 Last data filed at 02/10/2020 0700 Gross per 24 hour  Intake 4909.9 ml  Output 7318 ml  Net -2408.1 ml   Filed Weights   02/08/20 0615 02/09/20 0500 02/10/20 0458  Weight: 106 kg 103.8 kg 100.3 kg    Physical Exam    GEN- The patient is critically ill and fatigued appearing. Responds to questions and commands.  Head- normocephalic, atraumatic Eyes-  Sclera clear, conjunctiva pink Ears- hearing intact Oropharynx- clear Neck- supple Lungs- On vent through TC. Normal work of breathing Heart- Sternum remains up. Wound vac in place. Irregular due to frequent ectopy on tele GI- Soft, NT, ND, + BS. +PEG Extremities- no clubbing or cyanosis. 1-2+ edema up to thighs. Skin- no rash or lesion Psych- flat affect Neuro- alert and following commands.    Labs    CBC Recent Labs    02/09/20 0430 02/09/20 1513 02/09/20 1519 02/10/20 0322  WBC 14.5*  --  17.2* 15.4*  NEUTROABS 10.9*  --   --  11.5*  HGB 7.8*   < > 9.7* 9.1*  HCT 23.5*   < > 30.1* 28.9*  MCV 88.7  --  88.0 89.5  PLT 237  --  278 292   < > = values in this  interval not displayed.   Basic Metabolic Panel Recent Labs    02/09/20 0430 02/09/20 1513 02/09/20 1519 02/10/20 0322  NA 135   < > 135 133*  K 3.6   < > 4.3 4.2  CL 104  --  103 101  CO2 22  --  24 23  GLUCOSE 238*  --  181* 191*  BUN 20  --  23* 28*  CREATININE 1.20  --  1.38* 1.51*  CALCIUM 6.8*  --  7.9* 7.9*  MG 1.9  --   --  2.5*  PHOS 2.7  --  2.5 2.6   < > = values in this interval not displayed.   Liver Function Tests Recent Labs    02/09/20 1519 02/10/20 0322  ALBUMIN 1.7* 1.7*   No results for input(s): LIPASE, AMYLASE in the last 72 hours. Cardiac Enzymes No results for input(s): CKTOTAL, CKMB, CKMBINDEX, TROPONINI in the last 72 hours.   Telemetry    NSR 70-80s with very frequent ventricular ectopy in couplets, triplets, and short NSVT usually 4-6 beats (personally reviewed)  Radiology    DG Chest 1 View  Result Date: 02/10/2020 CLINICAL DATA:  Open-heart surgery.  Chest tube. EXAM: CHEST  1 VIEW COMPARISON:  02/08/2020. FINDINGS: Tracheostomy tube, right IJ line, right PICC line, mediastinal drainage catheter, bilateral chest tubes in stable position. Cardiac pacer and Impella device in stable position. Stable cardiomegaly. Persistent diffuse bilateral pulmonary infiltrates/edema. Stable right pleural effusion. Left pleural effusion cannot be excluded. Stable right apical prominent pleural based density. This could be loculated fluid. No pneumothorax. IMPRESSION: 1. Lines and tubes including bilateral chest tubes in stable position. No pneumothorax. 2. Cardiac pacer and Impella device in stable position. Stable cardiomegaly. Persistent diffuse bilateral pulmonary infiltrates/edema. Stable right pleural effusion. Left pleural effusion cannot be excluded. Right apical prominent pleural based density, possibly loculated fluid, again noted without interim change. Electronically Signed   By: Marcello Moores  Register   On: 02/10/2020 06:33    Patient Profile     Corey Palmer is a 50 y.o. male  With complex and prolonged history with NICM and ICD>> ICD shock for VT-PM followed by impella, CABG, afib and DCCV, CVVH, repeat OR for hematoma and wound vac, left with open chest return to OR for omental flap with trach G tube.  On milrinone and amio for afib  Assessment & Plan    VTNS   Shock --  impella support NorEpi. Milrinone and mitodrine w  RV failure   CAD s/p CABG open chest   Afib recurrent   CVVH   Increased VT burden. Initially seemed to settle overnight, but this am continues to have frequent couplets and triplets, with 4-5 beat runs of NSVT. No further sustained.   Device interrogation from yesterday supports VT.    Discussed with Dr. Caryl Comes. Continue amiodarone 58m/hr this am, and will rebolus with additional 150 mg.   Concern that increasing ventricular ectopy may portend a worsening prognosis -> increasing mechanical decompensation.  Palliative involvement to discuss goals of care has been mentioned in notes. May need to consider at this point.   Pt remains on dual pressor support with NE and milrinone. Impella at P7.   Patient is critically ill and prognosis is very guarded.   For questions or updates, please contact [place]Please consult [email or web address] for contact info under Cardiology/STEMI.  Signed, MShirley Friar PA-C  02/10/2020, 7:21 AM   Discussed with AT but not seen

## 2020-02-10 NOTE — Progress Notes (Signed)
[facility] for Heparin Indication: Impella 5.5  Allergies  Allergen Reactions  . Orange Fruit Anaphylaxis, Hives and Other (See Comments)    Per Pt- Blisters around lips and Hives all over, also  . Penicillins Anaphylaxis    Did it involve swelling of the face/tongue/throat, SOB, or low BP? Yes Did it involve sudden or severe rash/hives, skin peeling, or any reaction on the inside of your mouth or nose? Yes Did you need to seek medical attention at a hospital or doctor's office? No When did it last happen?childhood If all above answers are "NO", may proceed with cephalosporin use.  [name] [Empagliflozin] Itching  . Basaglar Kwikpen [Insulin Glargine] Nausea And Vomiting    Patient Measurements: Height: 6\' 3"  (190.5 cm) Weight: 100.3 kg (221 lb 1.9 oz) IBW/kg (Calculated) : 84.5 Heparin dosing wt: 101 kg  Vital Signs: Temp: 98.6 F (37 C) (01/13 0400) Temp Source: Oral (01/13 0400) BP: 106/78 (01/13 0700) Pulse Rate: 83 (01/13 0700)  Labs: Recent Labs    02/27/2020 2058 02/08/20 0101 02/08/20 0400 02/08/20 1459 02/09/20 0430 02/09/20 1513 02/09/20 1519 02/10/20 0322  HGB 7.1*   < > 7.9*   < > 7.8* 9.9* 9.7* 9.1*  HCT 22.8*   < > 25.0*   < > 23.5* 29.0* 30.1* 28.9*  PLT 274  --  229   < > 237  --  278 292  LABPROT 15.9*  --   --   --   --   --   --   --   INR 1.3*  --   --   --   --   --   --   --   HEPARINUNFRC  --   --  <0.10*  --  <0.10*  --   --  <0.10*  CREATININE 1.62*  --  1.50*   < > 1.20  --  1.38* 1.51*   < > = values in this interval not displayed.    Estimated Creatinine Clearance: 69.2 mL/min (A) (by C-G formula based on SCr of 1.51 mg/dL (H)).   Assessment: 50 yo male s/p impella 5.5 placement. Pt started on heparinized purge and systemic heparin. Pt now s/p CABG 12/15 with Impella remaining in place c/b significant bleeding and hematoma requiring opened chest and reexploration in OR 12/16. Chest closed on  12/23, re-opened on 12/24. Underwent cardioversion in OR 12/28, s/p sternal wash out/application of wound VAC.   Underwent sternal washout, wound VAC change, followed by sternal closure on 1/5. Chest was re-opened on 1/5 given compromised hemodynamics.   Impella on P7 with stable purge pressures (400-500), purge flow rate 8.3 ml/hr (only on heparin in purge solution for now). Hgb 9.1, plt 292 (fairly stable), LDH 400.  Heparin level remains undetectable as expected.  Goal of Therapy:  Monitor platelets by anticoagulation protocol: Yes   Plan:  Getting heparin (415 units/hr) in purge solution. Not adding systemic heparin for now. F/u daily CBC, heparin level.  [name], PharmD, [place] Clinical Pharmacist  Phone: [phone] 02/10/2020 7:46 AM  Please check AMION for all [place] phone numbers After 10:00 PM, call [place] [phone]

## 2020-02-10 NOTE — Progress Notes (Addendum)
Patient ID: Corey Palmer, male   DOB: 05/13/1970, 50 y.o.   MRN: [id number]     Advanced Heart Failure Rounding Note  PCP-Cardiologist: No primary care provider on file.   Subjective:    - 12/7 Torsades -ICD shock x1.  - 12/11 Impella 5.5 placed.  - 12/12 Impella repositioned in OR - 12/15 CABG x 4 with LIMA-LAD, SVG-PDA/PLV, SVG-OM - 12/16 DCCV afib.  Back to OR to re-open chest and again to evacuate hematoma.  - 12/19 CVVH initiated  - 12/23 Chest closed - 12/24 Chest reopened - 12/26 RAP back into NSR - 12/27 back in atrial fibrillation, unable to rapid atrial pace out.   - 12/28 To OR, chest partially closed and wound vac placed.  DCCV to NSR.  - 12/31 Antibiotics stopped. Swan removed, HD catheter moved from right femoral to right IJ.  - 1/5 To OR for chest closure. Chest reopened emergently at bedside again to evacuate hematoma  - 1/10 To OR: omental flap closure of chest with wound vac, tracheostomy, G-tube, J-tube.     Last night developed recurrent runs of VT w/ hemodynamic instability. Rebolused w amio and rate increased back to 88m/hr. EP now following. Continues w/ runs of NSVT. K 4.2, Mg 2.5   On milrinone 0.125 + NE down to 26 + VP 0.04 + midodrine 15 tid. Impella turned down to P-7. Co-ox good at 79%. CVP 6  On CVVH pulling 100 cc/hr currently.    WBC 16>>14>>17>>15K.  Off abx. AF   Transfused yesterday x 1 unit PRBCs (bleeding from PEG). Hgb 7.6>>9.9>>9.1   Awake and follows commands.   Impella 5.5 P-7 Flow 3.9 Waveforms ok. No alarms LDH 332 => 381=> 396=>408 => 392=>383=>410    Objective:   Weight Range: 100.3 kg Body mass index is 27.64 kg/m.   Vital Signs:   Temp:  [98.4 F (36.9 C)-98.9 F (37.2 C)] 98.6 F (37 C) (01/13 0400) Pulse Rate:  [71-103] 83 (01/13 0700) Resp:  [24-28] 28 (01/13 0422) BP: (93-115)/(55-85) 106/78 (01/13 0700) SpO2:  [94 %-100 %] 100 % (01/13 0700) Arterial Line BP: (77-118)/(49-65) 116/59 (01/13 0700) FiO2 (%):   [50 %] 50 % (01/13 0422) Weight:  [100.3 kg] 100.3 kg (01/13 0458) Last BM Date: 02/09/20  Weight change: Filed Weights   02/08/20 0615 02/09/20 0500 02/10/20 0458  Weight: 106 kg 103.8 kg 100.3 kg    Intake/Output:   Intake/Output Summary (Last 24 hours) at 02/10/2020 0741 Last data filed at 02/10/2020 0700 Gross per 24 hour  Intake 4909.9 ml  Output 7318 ml  Net -2408.1 ml      Physical Exam   CVP 6 General: critically male, awake, following commands. On Vent through TC.  HEENT: + TC  Neck: supple. no JVD. Carotids 2+ bilat; no bruits. No lymphadenopathy or thyromegaly appreciated. Cor: PMI nondisplaced. Regular rate & rhythm. No rubs, gallops or murmurs. + sternal wound vac Lungs: clear Abdomen: soft, nontender, nondistended. No hepatosplenomegaly. No bruits or masses. Good bowel sounds. + PEG  Extremities: no cyanosis, clubbing, rash, 1+ edema up to thighs  Neuro: alert and follows commands.    Telemetry   NSR 80s, frequent runs of NSVT overnight (personally reviewed)  Labs    CBC Recent Labs    02/09/20 0430 02/09/20 1513 02/09/20 1519 02/10/20 0322  WBC 14.5*  --  17.2* 15.4*  NEUTROABS 10.9*  --   --  11.5*  HGB 7.8*   < > 9.7* 9.1*  HCT  23.5*   < > 30.1* 28.9*  MCV 88.7  --  88.0 89.5  PLT 237  --  278 292   < > = values in this interval not displayed.   Basic Metabolic Panel Recent Labs    02/09/20 0430 02/09/20 1513 02/09/20 1519 02/10/20 0322  NA 135   < > 135 133*  K 3.6   < > 4.3 4.2  CL 104  --  103 101  CO2 22  --  24 23  GLUCOSE 238*  --  181* 191*  BUN 20  --  23* 28*  CREATININE 1.20  --  1.38* 1.51*  CALCIUM 6.8*  --  7.9* 7.9*  MG 1.9  --   --  2.5*  PHOS 2.7  --  2.5 2.6   < > = values in this interval not displayed.   Liver Function Tests Recent Labs    02/09/20 1519 02/10/20 0322  ALBUMIN 1.7* 1.7*   No results for input(s): LIPASE, AMYLASE in the last 72 hours. Cardiac Enzymes No results for input(s): CKTOTAL,  CKMB, CKMBINDEX, TROPONINI in the last 72 hours.  BNP: BNP (last 3 results) Recent Labs    01/23/2020 1619  BNP 577.5*    ProBNP (last 3 results) No results for input(s): PROBNP in the last 8760 hours.   D-Dimer No results for input(s): DDIMER in the last 72 hours. Hemoglobin A1C No results for input(s): HGBA1C in the last 72 hours. Fasting Lipid Panel No results for input(s): CHOL, HDL, LDLCALC, TRIG, CHOLHDL, LDLDIRECT in the last 72 hours. Thyroid Function Tests No results for input(s): TSH, T4TOTAL, T3FREE, THYROIDAB in the last 72 hours.  Invalid input(s): FREET3  Other results:   Imaging    DG Chest 1 View  Result Date: 02/10/2020 CLINICAL DATA:  Open-heart surgery.  Chest tube. EXAM: CHEST  1 VIEW COMPARISON:  02/08/2020. FINDINGS: Tracheostomy tube, right IJ line, right PICC line, mediastinal drainage catheter, bilateral chest tubes in stable position. Cardiac pacer and Impella device in stable position. Stable cardiomegaly. Persistent diffuse bilateral pulmonary infiltrates/edema. Stable right pleural effusion. Left pleural effusion cannot be excluded. Stable right apical prominent pleural based density. This could be loculated fluid. No pneumothorax. IMPRESSION: 1. Lines and tubes including bilateral chest tubes in stable position. No pneumothorax. 2. Cardiac pacer and Impella device in stable position. Stable cardiomegaly. Persistent diffuse bilateral pulmonary infiltrates/edema. Stable right pleural effusion. Left pleural effusion cannot be excluded. Right apical prominent pleural based density, possibly loculated fluid, again noted without interim change. Electronically Signed   By: Marcello Moores  Register   On: 02/10/2020 06:33     Medications:     Scheduled Medications: . amiodarone  150 mg Intravenous Once  . vitamin C  500 mg Per Tube TID  . aspirin  324 mg Per Tube Daily  . atorvastatin  80 mg Per Tube Daily  . busPIRone  10 mg Per Tube TID  . chlorhexidine  gluconate (MEDLINE KIT)  15 mL Mouth Rinse BID  . Chlorhexidine Gluconate Cloth  6 each Topical Daily  . clonazepam  0.25 mg Per Tube BID  . darbepoetin (ARANESP) injection - NON-DIALYSIS  100 mcg Subcutaneous Q Sat-1800  . docusate  100 mg Per Tube BID  . feeding supplement (PROSource TF)  45 mL Per Tube TID  . fiber  1 packet Per Tube BID  . FLUoxetine  40 mg Per Tube BID  . lidocaine  1 patch Transdermal Q24H  . mouth rinse  15  mL Mouth Rinse 10 times per day  . melatonin  5 mg Per Tube Daily  . midodrine  15 mg Per Tube TID WC  . multivitamin  1 tablet Per Tube QHS  . oxyCODONE  10 mg Per Tube Q6H  . pantoprazole (PROTONIX) IV  40 mg Intravenous Q12H  . polyethylene glycol  17 g Per Tube Daily  . sodium chloride flush  10-40 mL Intracatheter Q12H  . thiamine  100 mg Per Tube Daily  . valproic acid  250 mg Per Tube BID    Infusions: .  prismasol BGK 4/2.5 500 mL/hr at 02/10/20 0259  .  prismasol BGK 4/2.5 200 mL/hr at 02/09/20 2252  . sodium chloride Stopped (01/29/20 0955)  . sodium chloride    . amiodarone 60 mg/hr (02/10/20 0705)  . electrolyte-A Stopped (02/03/20 2902)  . epinephrine Stopped (02/08/20 0651)  . feeding supplement (PIVOT 1.5 CAL) 1,000 mL (02/10/20 0726)  . impella catheter heparin 50 unit/mL in dextrose 5%    . HYDROmorphone 2 mg/hr (02/10/20 0700)  . insulin 3.6 mL/hr at 02/10/20 0700  . lactated ringers 20 mL/hr at 02/10/20 0700  . milrinone 0.125 mcg/kg/min (02/10/20 0700)  . norepinephrine (LEVOPHED) Adult infusion 27 mcg/min (02/10/20 0719)  . prismasol BGK 4/2.5 1,500 mL/hr at 02/10/20 0634  . vasopressin 0.04 Units/min (02/10/20 0700)    PRN Medications: sodium chloride, Place/Maintain arterial line **AND** sodium chloride, artificial tears, bisacodyl **OR** bisacodyl, docusate, heparin, heparin, HYDROmorphone, metoprolol tartrate, polyethylene glycol, sodium chloride flush     Assessment/Plan   1. Cardiogenic shock/acute on chronic  systolic CHF: Initially nonischemic cardiomyopathy. Possible familial cardiomyopathy as both parents had cardiomyopathy and died at around 39.However, [employer] gene testing did not show any common mutation for cardiomyopathy.  However, this admission noted to have severe 3 vessel disease so suspect component of ischemic cardiomyopathy. St Jude ICD. Echo in 8/20 with EF 15% and mildly decreased RV function. Echo this admission with EF < 20%, moderate LV dilation, RV mildly reduced, severe LAE, no significant MR.Low output HF with markedly low EF.  He has a long history of cardiomyopathy (20 yrs), tends to minimize symptoms.  Cardiorenal syndrome with creatinine up to 2.2,  stabilized on milrinone and Impella 5.5. SCr 1.44 day of CABG. CABG 12/15.  Post-op shock, back to OR 12/16 to open chest (chest wall compartment syndrome) and again to evacuate hematoma.  TEE post-op with severe RV dysfunction.  CVVHD initiated 12/19 for volume removal in setting of rising Scr/decreased UOP. Suspect ATN. S/p chest closure 12/23. Hemodynamics much worse on 12/24 with rising pressor demands. Co-ox 36%.  Bedside echo severe biventricular dysfunction with marked septal bounce. Chest re-opened 12/24.  Was atrial paced back to NSR on 12/26 with improvement in hemodynamics but back in atrial fibrillation on 12/27 and unable to pace out, DCCV to NSR on 12/28. Went back to OR 1/5 for chest closure but several hours later required emergent re-opening of chest at bedside to evacuate hematoma. To OR again 1/10 with closure of chest with omental flap. 1/12 w/ frequent NSVT requiring amio rebolus. Able to wean down NE, now at 26 mcg. Off Epi. Remains on milrinone 0.125 + VP 0.04. Impella at P-7. Flow 3.9. No alarms. Co-ox good at 79%. CVP 6 but remains edematous (3rd spacing). - Continue CVVH. D/w nephrology, plan to reduce pull rate to 50/hr (?if contributing to increased ectopy) - Continue to wean NE as tolerated (MAPs mid 70s).  -  Continue milrinone  at 0.125.  - Continue Impella at P-7 while he is on high pressor doses.  2. Atrial fibrillation: H/o PAF.  He was on dofetilide in the remote past but this was stopped due to noncompliance. He had an upper GI bleed from antral ulcers in 3/12. He was seen by GI and was deemed safe to restart anticoagulation as long as he remains on a PPI.  No apparent recurrence of AF until just prior to this admission, was cardioverted back to NSR in ER but went back into atrial fibrillation.  Post-op afib, DCCV to NSR/BiV pacing am 12/16 -> back to AF. Rapid atrial pacing back to SR on 12/26 -> back in AF on 12/27 and unable to pace out. 12/28 DCCV to NSR.  Maintaining NSR + frequent NSVT - Continue IV amiodarone at 60 mg/hr.   - Currently off systemic heparin with chest bleeding. Getting heparin through impella purge  - Did not have Maze with CABG.    3. CAD:   Cath this admission with severe 3 vessel disease. CABG x 4 on 12/15.  - Continue atorvastatin, ASA.  - continue current management 4. Acute on chronic hypoxemic respiratory failure: Vent per CCM.  Has tracheostomy.  - CCM following 5. AKI on CKD stage 3: Likely ATN/cardiorenal. Anuric. On CVVH.    - As above, aim for net negative 50-100 cc/hr via CVVH today. 6. Diabetes: Insulin.  7. Hyponatremia: Resolved with CVVH.  8. Torsades/ICD shock: 12/7/212 in hospital event, in setting of severe hypokalemia and hypomagnesemia.   9. Gout: h/o gout. Complained of rt knee pain c/w previous flares - treated w/ prednisone burst (completed).  10: ID: completed abx for PNA.  Afebrile.  Cultures NGTD.   11. FEN: Restart TFs via J-tube. 12. Anemia: Hgb 9.1 today.  Transfuse < 7.5.  13. Stage II Pressure Ulcer- sacrum -Reposition every 2 hours R/L  14. Pleural Effusion  - loculated rt apical pleural effusion noted on imaging.  - continue UF through CVVH  - PCCM following  15. VT - rebolus amio. Continue gtt at 60 hr - keep K > 4.0 and Mg >  2.0 - wean NE as tolerated  - appreciate EP    Lyda Jester, PA-C  02/10/2020 7:41 AM   Agree with above.   Remains on vent through trach. Also on Impella and CVVHD. NE down to 26. Also on milrinone and VP.   Will awaken at times on vent and interact. Chest open with omental flap.   Last night developed multiple runs of VT with hemodynamic instability. Seen by Dr. Caryl Comes and amio increased. Felt to be related to electromechanical issues from failing heart.  General:  Sedated on vent.  HEENT: normal  Neck: supple. + trialysis cath Carotids 2+ bilat; no bruits. No lymphadenopathy or thryomegaly appreciated. Cor: Impella site ok. Chest open with wound vac in place  Regular rate & rhythm. No rubs, gallops or murmurs. Lungs: coarse Abdomen: soft, nontender, nondistended. No hepatosplenomegaly. No bruits or masses. Good bowel sounds. Extremities: no cyanosis, clubbing, rash, trace edema Neuro: sedated on vent  Remains very tenuous. Will continue to wean pressors as tolerated. Having occasional NSVT but much improved from yesterday. Plastics has seen for possible chest closure down the road.   Impella parameters stable.   Long talk with patient's wife at bedside about events from last night (VT) and how serious his condition and possibility that he may not survive this hospitalization. She expressed growing concern as well and asks  if her son should come from [place]. I have suggested we involve the Palliative Care team to help with [place] and family support. She is agreeable to this.   CRITICAL CARE Performed by: Glori Bickers  Total critical care time: 45 minutes  Critical care time was exclusive of separately billable procedures and treating other patients.  Critical care was necessary to treat or prevent imminent or life-threatening deterioration.  Critical care was time spent personally by me (independent of midlevel providers or residents) on the following activities:  development of treatment plan with patient and/or surrogate as well as nursing, discussions with consultants, evaluation of patient's response to treatment, examination of patient, obtaining history from patient or surrogate, ordering and performing treatments and interventions, ordering and review of laboratory studies, ordering and review of radiographic studies, pulse oximetry and re-evaluation of patient's condition.  Glori Bickers, MD  1:52 PM

## 2020-02-10 NOTE — Progress Notes (Signed)
Inpatient Diabetes Program Recommendations  AACE/ADA: New Consensus Statement on Inpatient Glycemic Control (2015)  Target Ranges:  Prepandial:   less than 140 mg/dL      Peak postprandial:   less than 180 mg/dL (1-2 hours)      Critically ill patients:  140 - 180 mg/dL   Lab Results  Component Value Date   GLUCAP 201 (H) 02/10/2020   HGBA1C 10.6 (H) 01/03/2020    Review of Glycemic Control Results for Corey Palmer, Corey Palmer (MRN [id number]) as of 02/10/2020 10:58  Ref. Range 02/10/2020 03:17 02/10/2020 05:39 02/10/2020 07:17 02/10/2020 08:17 02/10/2020 09:01 02/10/2020 09:55  Glucose-Capillary Latest Ref Range: 70 - 99 mg/dL 185 (H) 178 (H) 185 (H) 180 (H) 174 (H) 201 (H)   Diabetes history: DM 2 Outpatient Diabetes medications:  Lantus 40 units q AM, Novolog flexpen 5-50 units tid with meals, Ozempic 0.5 mg weekly Current orders for Inpatient glycemic control:  IV insulin-   Inpatient Diabetes Program Recommendations:    Note that insulin drip rates approximately 4 units/hr.  Once patient is ready to transition off insulin drip, consider Levemir 24 units bid, and Novolog 8 units q 4 hours, and Novolog standard correction q 4 hours.   Thanks,  Adah Perl, RN, BC-ADM Inpatient Diabetes Coordinator Pager [phone] (8a-5p)

## 2020-02-10 NOTE — Progress Notes (Signed)
This chaplain responded to the unit page for spiritual care.  The chaplain understands the Pt. wife-[name], who is at the Pt. bedside, recently learned of the seriousness of the Pt. condition and the Pt. may not survive this hospitalization.    The chaplain is present bedside with [name].  The Pt. RN-Scott is present adjusting the Pt. medications. The chaplain observes the Pt. healthcare complexity in the moment and the recent communication adding to [facility] emotions. [name] uses all her efforts to comfort the Pt. anxiety.  Prayer was shared with the Pt. and Corey Palmer.  The chaplain joins Lambert Keto as she walks to her car.  The chaplain understands and has permission to share, Corey Palmer's disappointment in the healthcare team's communication and her desire to make sure family has time to visit the Pt.    Lambert Keto accepted the chaplain's invitation for F/U spiritual care.

## 2020-02-10 NOTE — Progress Notes (Addendum)
[facility] Progress Note    Assessment/ Plan:    49M with familial cardiomyopathy EF20% s/p redo CABG 01/23/2020 complicated by persistent shock, multiple attempts at sternal closure with formation of chest hematoma and tamponade, Afib with RVR, and AKI requiring CRRT.    #AKI/CKD stage III- presumably due to ischemic ATN in setting of cardiogenic shock s/p CABG.  CRRT started 01/16/20. All fluids 4K/2.5Ca.  Right femoral HD catheter placed 01/16/20 switched to RIJ on 01/28/20. Replete K prn - continue CRRT--> Continue goal net negative today.  # CAD s/p CABG complicated by cardiogenic shock and post-op hematoma. secondary closure on 12/23 then had to be re opened.  Went to the OR for washout on 12/28, bedside washout 02/01/19.  OR 02/28/2020 for closure, then had emergent reopening at the bedside 02/05/2020 d/t anterior chest hematoma again. OR for chest closure on 1/10.  # Cardiogenic shock- underlying familial cardiomyopathy s/p redo CABG, EF ~20%. Remains on pressors: levo and vaso. Also on milrinone. Impella P8. Continue mgmt per primary  # ABLA- s/p postoperative bleeding into chest cavity s/p re-exploration for tamponade/hematoma. Received multiple blood products  # Atrial fibrillation - s/p DCCV on 12/16.  On amiodarone.  Normal sinus rhythm currently  # VDRF- per primary svc. Trach/peg 1/10  #-Leukocytosis/ new fever: cultures redrawn 02/01/20, s/p vanc and meropenem  # R loculated pleural effusion: for CT chest on 1/6 with contrast which is necessary for clinical decision making.  Subjective:     Seen on crrt. Ectopy overnight, ep consulted. Tolerating CRRT, net neg ~2.3L. made urine overnight (pads and sheets soaked), to be bladder scanned per team.   Objective:   BP 103/70   Pulse 84   Temp 99 F (37.2 C) (Axillary)   Resp (!) 28   Ht 6' 3" (1.905 m)   Wt 100.3 kg   SpO2 99%   BMI 27.64 kg/m   Intake/Output Summary (Last 24 hours) at 02/10/2020  1029 Last data filed at 02/10/2020 1000 Gross per 24 hour  Intake 4805.84 ml  Output 7064 ml  Net -2258.16 ml   Weight change: -3.5 kg  Physical Exam: Gen: nad CVS: reg rate CHEST: woundvac foam in place, R  Impella Resp: trach Abd: no distention Ext: anasarca Neuro: follows commands, alert ACCESS: R IJ nontunneled HD cath  Imaging: DG Chest 1 View  Result Date: 02/10/2020 CLINICAL DATA:  Open-heart surgery.  Chest tube. EXAM: CHEST  1 VIEW COMPARISON:  02/08/2020. FINDINGS: Tracheostomy tube, right IJ line, right PICC line, mediastinal drainage catheter, bilateral chest tubes in stable position. Cardiac pacer and Impella device in stable position. Stable cardiomegaly. Persistent diffuse bilateral pulmonary infiltrates/edema. Stable right pleural effusion. Left pleural effusion cannot be excluded. Stable right apical prominent pleural based density. This could be loculated fluid. No pneumothorax. IMPRESSION: 1. Lines and tubes including bilateral chest tubes in stable position. No pneumothorax. 2. Cardiac pacer and Impella device in stable position. Stable cardiomegaly. Persistent diffuse bilateral pulmonary infiltrates/edema. Stable right pleural effusion. Left pleural effusion cannot be excluded. Right apical prominent pleural based density, possibly loculated fluid, again noted without interim change. Electronically Signed   By: Thomas  Register   On: 02/10/2020 06:33    Labs: BMET Recent Labs  Lab 02/12/2020 0234 02/06/2020 1549 02/21/2020 1723 02/13/2020 2050 02/04/2020 2058 02/08/20 0101 02/08/20 0400 02/08/20 1459 02/08/20 1502 02/09/20 0430 02/09/20 1513 02/09/20 1519 02/10/20 0322  NA 134*   < > 133*   < > 131*   < >   133* 135 134* 135 137 135 133*  K 3.9   < > 4.1   < > 4.5   < > 4.7 4.3 4.4 3.6 4.3 4.3 4.2  CL 102   < > 100  --  101  --  102  --  102 104  --  103 101  CO2 24  --   --   --  22  --  23  --  24 22  --  24 23  GLUCOSE 152*   < > 208*  --  213*  --  137*  --   199* 238*  --  181* 191*  BUN 23*   < > 22*  --  23*  --  21*  --  21* 20  --  23* 28*  CREATININE 1.43*   < > 1.50*  --  1.62*  --  1.50*  --  1.45* 1.20  --  1.38* 1.51*  CALCIUM 8.1*  --   --   --  7.4*  --  7.8*  --  7.6* 6.8*  --  7.9* 7.9*  PHOS 1.8*  --   --   --  4.5  --  3.7  --  3.3 2.7  --  2.5 2.6   < > = values in this interval not displayed.   CBC Recent Labs  Lab 02/04/2020 0234 02/13/2020 1549 02/08/20 0400 02/08/20 1459 02/08/20 1502 02/09/20 0430 02/09/20 1513 02/09/20 1519 02/10/20 0322  WBC 10.1   < > 16.1*  --  14.3* 14.5*  --  17.2* 15.4*  NEUTROABS 7.6  --  12.9*  --   --  10.9*  --   --  11.5*  HGB 7.9*   < > 7.9*   < > 7.3* 7.8* 9.9* 9.7* 9.1*  HCT 24.0*   < > 25.0*   < > 22.0* 23.5* 29.0* 30.1* 28.9*  MCV 91.3   < > 91.2  --  89.4 88.7  --  88.0 89.5  PLT 151   < > 229  --  246 237  --  278 292   < > = values in this interval not displayed.    Medications:    . vitamin C  500 mg Per Tube TID  . aspirin  324 mg Per Tube Daily  . atorvastatin  80 mg Per Tube Daily  . busPIRone  10 mg Per Tube TID  . chlorhexidine gluconate (MEDLINE KIT)  15 mL Mouth Rinse BID  . Chlorhexidine Gluconate Cloth  6 each Topical Daily  . clonazepam  0.25 mg Per Tube BID  . darbepoetin (ARANESP) injection - NON-DIALYSIS  100 mcg Subcutaneous Q Sat-1800  . docusate  100 mg Per Tube BID  . feeding supplement (PROSource TF)  45 mL Per Tube TID  . fiber  1 packet Per Tube BID  . FLUoxetine  40 mg Per Tube BID  . lidocaine  1 patch Transdermal Q24H  . mouth rinse  15 mL Mouth Rinse 10 times per day  . melatonin  5 mg Per Tube Daily  . midodrine  15 mg Per Tube TID WC  . multivitamin  1 tablet Per Tube QHS  . oxyCODONE  10 mg Per Tube Q6H  . pantoprazole (PROTONIX) IV  40 mg Intravenous Q12H  . polyethylene glycol  17 g Per Tube Daily  . sodium chloride flush  10-40 mL Intracatheter Q12H  . thiamine  100 mg Per Tube Daily  . valproic acid  250 mg Per Tube BID       Gean Quint  02/10/2020, 10:29 AM

## 2020-02-10 NOTE — Progress Notes (Signed)
NAME:  Corey Palmer, MRN:  [id number], DOB:  08/05/1970, LOS: 46 ADMISSION DATE:  01/03/2020, CONSULTATION DATE: 01/22/2020 REFERRING MD: Aundra Dubin, CHIEF COMPLAINT: Respiratory failure post Impella  HPI/course in hospital  50 year old man admitted with A. fib/RVR, torsades, cardiogenic shock [EF 15] requiring Impella, CABG x5, AKI requiring initiation of CRRT  Past Medical History:    has a past medical history of Anginal pain ([place]), Anxiety, Arthritis, Automatic implantable cardioverter-defibrillator in situ (2007), Bipolar disorder ([place]), CAD (coronary artery disease), Cancer ([place]) (2013), CHF (congestive heart failure) ([place]), Chondrocalcinosis of right knee (5/36/6440), Chronic systolic heart failure ([place]), Depression, Diabetes uncomplicated adult-type II, Dilated cardiomyopathy ([place]), Dyslipidemia, Dysrhythmia, GERD (gastroesophageal reflux disease), Gout, unspecified, Hepatic steatosis (2011), History of kidney stones, echocardiogram, Hypertension, Obesity (BMI 30.0-34.9), Pacemaker, Paroxysmal atrial fibrillation ([place]), Peptic ulcer, Shortness of breath, and Sleep apnea.  Significant Hospital Events:  12/2 Afib with RVR. Cardioverted in ED. AICD did not fire. Milrinone for low co ox.  12/7 ICD fired, Torsades- likely from low K of 2.8.  12/8 Cath- multivessel disease w/ low CO. Volume overloaded.  12/11 Underwent Impella 5.5 insertion for persistent symptoms of forward failure with low cardiac indices. 12/12 Back to the OR for displacement of Impella. Extubated.  12/15 CABG x4, with LIMA-LAD, SVG-PDA/PLV, SVG-OM 12/16 Emergently opened chest at bedside for tamponade, clot compressing right atrium  12/16 To OR for Exploration of chest due to bleeding  12/19 Started CRRT 12/23 Sternal Closure in OR 12/24 Worsening shock.  Chest reopened at bedside with improvement in hemodynamics.  No mediastinal hematoma. 12/25 A flutter > rapid a paced to sinus rhythm.  Remains on high-dose pressors,  inotropes 12/28 Weaning vasopressor requirements.  For chest washout today.  Back in atrial fibrillation, refractory to increased amiodarone and attempted overdrive pacing.  DCCV to NSR. Tolerating fluid removal via CRRT. 1/3: Appears euvolemic.  Adjusting glycemic control.  Chest still open so not weaning 1/4, spiking fever, Vanco meropenem resumed after culture sent from blood and sputum. 1/5 awaiting return to the OR for wound VAC dressing assessment fever curve and white blood cell count improving after antibiotics resumed the day prior, did have chest closed, developed tamponade physiology shortly after had to go back for emergent reopening of chest, return to intensive care and refractory shock 1/6: Wound VAC dressing replaced.  Still in shock.  Starting to wean sedation 1/7: Still on high-dose pressors  Consults:  PCCM, nephrology, cardiology, cardiothoracic surgery  Procedures:  12/2 R PICC >> 12/11 right ax impella 5.5 >> 12/15 ETT >> 12/15 right femoral arterial sheath >> 12/19 right femoral HD >> 12/23 chest tube-right pleural, left pleural , mediastinal  >> 1/10  Trach->1/11 1/10 PEG->1/11  Significant Diagnostic Tests:    Micro Data:  Blood culture 12/14 > negative  Blood culture 12/16 > negative Respiratory culture 12/20 > rare candida albicans Sternal wound culture 12/23 > no growth Fungal cx sternal wound 12/23 >> neg Fungal cx w/stain sternal wound 12/23 >> negative AFB sternal wound 12/23 >> negative 1/4 blood culture x2>>> neg 1/4 respiratory culture>>> rare candida albicans Sputum 1/9 > negative  Antimicrobials:  Vancomycin 12/16 >>12/31 Meropenem 12/16 >> 12/31 Vancomycin and meropenem resumed on 1/4->1/12  Interval history:  No acute events overnight  Remains alert and oriented  Reports continued chest and foot pain  Remains on CRRT and Cardiac support   Objective   Blood pressure 106/78, pulse 83, temperature 98.6 F (37 C), temperature source  Oral, resp. rate (!) 28,  height 6\' 3"  (1.905 m), weight 100.3 kg, SpO2 100 %. CVP:  [4 mmHg-15 mmHg] 6 mmHg  Vent Mode: PRVC FiO2 (%):  [50 %] 50 % Set Rate:  [28 bmp] 28 bmp Vt Set:  [510 mL] 510 mL PEEP:  [5 cmH20] 5 cmH20 Plateau Pressure:  [20 cmH20-28 cmH20] 22 cmH20   Intake/Output Summary (Last 24 hours) at 02/10/2020 0731 Last data filed at 02/10/2020 0700 Gross per 24 hour  Intake 4909.9 ml  Output 7318 ml  Net -2408.1 ml   Filed Weights   02/08/20 0615 02/09/20 0500 02/10/20 0458  Weight: 106 kg 103.8 kg 100.3 kg   CVP:  [4 mmHg-15 mmHg] 6 mmHg  Examination: General: Chronically ill appearing elderly very deconditioned male lying in bed on mechanical ventilation, in NAD HEENT: 8 cuffed shiley Trach midline, MM pink/moist, PERRL,  Neuro: Alert and able to follow simple commands CV: s1s2 regular rate and rhythm, no murmur, rubs, or gallops,  PULM:  Clear to ascultation, tolerating vent well, no added breath sounds GI: soft, bowel sounds active in all 4 quadrants, non-tender, non-distended Extremities: warm/dry, no edema  Skin: no rashes or lesions  Assessment & Plan:   Acute hypoxic and hypercapnic respiratory failure requiring mechanical ventilation following cardiac surgery -s/p trach attempting to finesse sedation to allow for comfort but also cpap vs atc  Persistent right sided loculated pleural effusions - apically and in the base HAP - organism unknown P: Continue ventilator support with lung protective strategies  Wean PEEP and FiO2 for sats greater than 90%. Head of bed elevated 30 degrees. Plateau pressures less than 30 cm H20.  Follow intermittent chest x-ray and ABG.   SAT/SBT as tolerated, mentation preclude extubation  Ensure adequate pulmonary hygiene  Follow cultures  VAP bundle in place  PAD protocol Wean sedation as able   Cardiogenic shock s/p high risk CABG.  -Progressive shock on 1/5 secondary to obstructive physiology from hematoma  after attempted chest closure necessitating emergent reopening of chest with reevacuation Post cardiac surgery RV dysfunction CAD s/p CABG, c/b cardiac tamponade, hematoma evac Open chest with VAC.   P: Primary management per cardiology  Impella per cardiology Continue pressor and inotropic support ASA and stain therapy  Remains significantly volume overloaded Volume removal per HD Tolerating progressive volume removal per CRRT   Sepsis/septic shock  -Likely HAP P: Continue to hold ABX therapy  Follow cultures Trend CBC and fever curve   Paroxysmal Afib -Currently normal sinus P: Systemic heparin on hold due to mediastinal bleeding  Heparin purge via Impella Remains on IV amiodarone  Continuous telemetry   Acute kidney injury with anuria; likely cardiorenal syndrome P: Nephrology following  Remains on CRRT per nephrology  Volume removal per CRRT Follow renal function / urine output Trend Bmet Avoid nephrotoxins Ensure adequate renal perfusion   DM2 with hyperglycemia P: Remains on insulin drip  Q1hr CBG   Prolonged qt:  P: QTc prolonging medication stopped, avoid further  Optimize Mg and K Monitor intermittently   Acute blood loss anemia Anemia of critical illness Acute thrombocytopenia -Likely due to acute illness, possibly due to consumption from impella P: Trend CBC daily  Monitor for signs of further bleeding  Transfuse per protocol   Deconditioning -s/p trach -will need aggressive rehabilitation after prolonged critical illness  Daily Goals Checklist  Pain/Anxiety/Delirium protocol (if indicated): PAD protocol VAP protocol (if indicated): bundle in place. DVT prophylaxis:  On hold 2/2 bleeding Nutrition Status: via j/g tube slwoly increasing  GI prophylaxis: pantoprazole Glucose control: insulin gtt Mobility/therapy needs: bed rest  Code Status: full  Family Communication: per primary Disposition: ICU   Critical care time:    CRITICAL  CARE Performed by: Johnsie Cancel  Total critical care time: 38 minutes  Critical care time was exclusive of separately billable procedures and treating other patients.  Critical care was necessary to treat or prevent imminent or life-threatening deterioration.  Critical care was time spent personally by me on the following activities: development of treatment plan with patient and/or surrogate as well as nursing, discussions with consultants, evaluation of patient's response to treatment, examination of patient, obtaining history from patient or surrogate, ordering and performing treatments and interventions, ordering and review of laboratory studies, ordering and review of radiographic studies, pulse oximetry and re-evaluation of patient's condition.  Johnsie Cancel, NP-C [facility] Contact / Pager information can be found on Amion  02/10/2020, 7:46 AM

## 2020-02-11 ENCOUNTER — Inpatient Hospital Stay (HOSPITAL_COMMUNITY): Payer: BC Managed Care – PPO

## 2020-02-11 DIAGNOSIS — J9601 Acute respiratory failure with hypoxia: Secondary | ICD-10-CM | POA: Diagnosis not present

## 2020-02-11 DIAGNOSIS — Z95811 Presence of heart assist device: Secondary | ICD-10-CM | POA: Diagnosis not present

## 2020-02-11 DIAGNOSIS — R570 Cardiogenic shock: Secondary | ICD-10-CM | POA: Diagnosis not present

## 2020-02-11 LAB — GLUCOSE, CAPILLARY
Glucose-Capillary: 131 mg/dL — ABNORMAL HIGH (ref 70–99)
Glucose-Capillary: 132 mg/dL — ABNORMAL HIGH (ref 70–99)
Glucose-Capillary: 136 mg/dL — ABNORMAL HIGH (ref 70–99)
Glucose-Capillary: 155 mg/dL — ABNORMAL HIGH (ref 70–99)
Glucose-Capillary: 156 mg/dL — ABNORMAL HIGH (ref 70–99)
Glucose-Capillary: 165 mg/dL — ABNORMAL HIGH (ref 70–99)
Glucose-Capillary: 172 mg/dL — ABNORMAL HIGH (ref 70–99)
Glucose-Capillary: 176 mg/dL — ABNORMAL HIGH (ref 70–99)
Glucose-Capillary: 177 mg/dL — ABNORMAL HIGH (ref 70–99)
Glucose-Capillary: 177 mg/dL — ABNORMAL HIGH (ref 70–99)
Glucose-Capillary: 178 mg/dL — ABNORMAL HIGH (ref 70–99)
Glucose-Capillary: 179 mg/dL — ABNORMAL HIGH (ref 70–99)
Glucose-Capillary: 180 mg/dL — ABNORMAL HIGH (ref 70–99)
Glucose-Capillary: 181 mg/dL — ABNORMAL HIGH (ref 70–99)
Glucose-Capillary: 183 mg/dL — ABNORMAL HIGH (ref 70–99)
Glucose-Capillary: 183 mg/dL — ABNORMAL HIGH (ref 70–99)
Glucose-Capillary: 187 mg/dL — ABNORMAL HIGH (ref 70–99)
Glucose-Capillary: 187 mg/dL — ABNORMAL HIGH (ref 70–99)
Glucose-Capillary: 211 mg/dL — ABNORMAL HIGH (ref 70–99)

## 2020-02-11 LAB — CBC WITH DIFFERENTIAL/PLATELET
Abs Immature Granulocytes: 0.18 10*3/uL — ABNORMAL HIGH (ref 0.00–0.07)
Basophils Absolute: 0.1 10*3/uL (ref 0.0–0.1)
Basophils Relative: 0 %
Eosinophils Absolute: 0.7 10*3/uL — ABNORMAL HIGH (ref 0.0–0.5)
Eosinophils Relative: 5 %
HCT: 28.4 % — ABNORMAL LOW (ref 39.0–52.0)
Hemoglobin: 9.1 g/dL — ABNORMAL LOW (ref 13.0–17.0)
Immature Granulocytes: 1 %
Lymphocytes Relative: 6 %
Lymphs Abs: 0.9 10*3/uL (ref 0.7–4.0)
MCH: 28.6 pg (ref 26.0–34.0)
MCHC: 32 g/dL (ref 30.0–36.0)
MCV: 89.3 fL (ref 80.0–100.0)
Monocytes Absolute: 2 10*3/uL — ABNORMAL HIGH (ref 0.1–1.0)
Monocytes Relative: 14 %
Neutro Abs: 10.6 10*3/uL — ABNORMAL HIGH (ref 1.7–7.7)
Neutrophils Relative %: 74 %
Platelets: 334 10*3/uL (ref 150–400)
RBC: 3.18 MIL/uL — ABNORMAL LOW (ref 4.22–5.81)
RDW: 20 % — ABNORMAL HIGH (ref 11.5–15.5)
WBC: 14.4 10*3/uL — ABNORMAL HIGH (ref 4.0–10.5)
nRBC: 0 % (ref 0.0–0.2)

## 2020-02-11 LAB — HEPARIN LEVEL (UNFRACTIONATED): Heparin Unfractionated: <0.1 IU/mL — ABNORMAL LOW (ref 0.30–0.70)

## 2020-02-11 LAB — RENAL FUNCTION PANEL
Albumin: 1.7 g/dL — ABNORMAL LOW (ref 3.5–5.0)
Albumin: 1.7 g/dL — ABNORMAL LOW (ref 3.5–5.0)
Anion gap: 9 (ref 5–15)
Anion gap: 9 (ref 5–15)
BUN: 30 mg/dL — ABNORMAL HIGH (ref 6–20)
BUN: 33 mg/dL — ABNORMAL HIGH (ref 6–20)
CO2: 25 mmol/L (ref 22–32)
CO2: 24 mmol/L (ref 22–32)
Calcium: 8.1 mg/dL — ABNORMAL LOW (ref 8.9–10.3)
Calcium: 7.9 mg/dL — ABNORMAL LOW (ref 8.9–10.3)
Chloride: 101 mmol/L (ref 98–111)
Chloride: 101 mmol/L (ref 98–111)
Creatinine, Ser: 1.4 mg/dL — ABNORMAL HIGH (ref 0.61–1.24)
Creatinine, Ser: 1.47 mg/dL — ABNORMAL HIGH (ref 0.61–1.24)
GFR, Estimated: >60 mL/min (ref >60)
GFR, Estimated: 57 mL/min — ABNORMAL LOW (ref >60)
Glucose, Bld: 162 mg/dL — ABNORMAL HIGH (ref 70–99)
Glucose, Bld: 213 mg/dL — ABNORMAL HIGH (ref 70–99)
Phosphorus: 2.1 mg/dL — ABNORMAL LOW (ref 2.5–4.6)
Phosphorus: 2.3 mg/dL — ABNORMAL LOW (ref 2.5–4.6)
Potassium: 4.3 mmol/L (ref 3.5–5.1)
Potassium: 4.7 mmol/L (ref 3.5–5.1)
Sodium: 135 mmol/L (ref 135–145)
Sodium: 134 mmol/L — ABNORMAL LOW (ref 135–145)

## 2020-02-11 LAB — COOXEMETRY PANEL
Carboxyhemoglobin: 1.7 % — ABNORMAL HIGH (ref 0.5–1.5)
Methemoglobin: 0.8 % (ref 0.0–1.5)
O2 Saturation: 75.1 %
Total hemoglobin: 7.4 g/dL — ABNORMAL LOW (ref 12.0–16.0)

## 2020-02-11 LAB — LACTATE DEHYDROGENASE: LDH: 412 U/L — ABNORMAL HIGH (ref 98–192)

## 2020-02-11 LAB — MAGNESIUM: Magnesium: 2.4 mg/dL (ref 1.7–2.4)

## 2020-02-11 MED ORDER — LIDOCAINE IN D5W 4-5 MG/ML-% IV SOLN
1.0000 mg/min | INTRAVENOUS | Status: DC
  Administered 2020-02-11 – 2020-02-13 (×3): 1 mg/min via INTRAVENOUS
  Filled 2020-02-11 (×3): qty 500

## 2020-02-11 MED ORDER — LIDOCAINE HCL (CARDIAC) PF 100 MG/5ML IV SOSY
50.0000 mg | PREFILLED_SYRINGE | Freq: Once | INTRAVENOUS | Status: AC
  Administered 2020-02-11: 50 mg via INTRAVENOUS
  Filled 2020-02-11: qty 5

## 2020-02-11 MED ORDER — LIDOCAINE HCL (CARDIAC) PF 100 MG/5ML IV SOSY
100.0000 mg | PREFILLED_SYRINGE | INTRAVENOUS | Status: AC
  Administered 2020-02-11: 100 mg via INTRAVENOUS
  Filled 2020-02-11: qty 5

## 2020-02-11 MED ORDER — CLONAZEPAM 0.5 MG PO TBDP
0.5000 mg | ORAL_TABLET | Freq: Two times a day (BID) | ORAL | Status: DC
  Administered 2020-02-11: 0.5 mg
  Filled 2020-02-11: qty 1

## 2020-02-11 MED ORDER — MIDODRINE HCL 5 MG PO TABS
30.0000 mg | ORAL_TABLET | Freq: Three times a day (TID) | ORAL | Status: DC
  Administered 2020-02-11 – 2020-02-14 (×9): 30 mg
  Filled 2020-02-11 (×9): qty 6

## 2020-02-11 NOTE — Progress Notes (Addendum)
Patient ID: Corey Palmer, male   DOB: 06/12/1970, 50 y.o.   MRN: [id number]     Advanced Heart Failure Rounding Note  PCP-Cardiologist: No primary care provider on file.   Subjective:    - 12/7 Torsades -ICD shock x1.  - 12/11 Impella 5.5 placed.  - 12/12 Impella repositioned in OR - 12/15 CABG x 4 with LIMA-LAD, SVG-PDA/PLV, SVG-OM - 12/16 DCCV afib.  Back to OR to re-open chest and again to evacuate hematoma.  - 12/19 CVVH initiated  - 12/23 Chest closed - 12/24 Chest reopened - 12/26 RAP back into NSR - 12/27 back in atrial fibrillation, unable to rapid atrial pace out.   - 12/28 To OR, chest partially closed and wound vac placed.  DCCV to NSR.  - 12/31 Antibiotics stopped. Swan removed, HD catheter moved from right femoral to right IJ.  - 1/5 To OR for chest closure. Chest reopened emergently at bedside again to evacuate hematoma  - 1/10 To OR: omental flap closure of chest with wound vac, tracheostomy, G-tube, J-tube.    1/12 Plastics and EP consulted.  1/13 Palliative Care consulted.   On milrinone 0.125 + NE down to 22 + VP 0.04 + amio drip. Midodrine 15 tid. Impella turned down to P-5. Co-ox good at 75 %. CVP 12-13 .  On CVVH pulling 50 cc/hr currently.    WBC 16>>14>>17>>15>>14.4   Off abx. AF   Hgb 7.6>>9.9>>9.1>9.1    Awake on vent.   Impella 5.5 P-5 Flow 3.4 Waveforms ok. No alarms LDH 332 => 381=> 396=>408 => 392=>383=>410 =>412    Objective:   Weight Range: 98.9 kg Body mass index is 27.25 kg/m.   Vital Signs:   Temp:  [97.5 F (36.4 C)-99.2 F (37.3 C)] 97.5 F (36.4 C) (01/14 0400) Pulse Rate:  [69-99] 87 (01/14 0613) Resp:  [28-32] 29 (01/14 0613) BP: (83-117)/(53-78) 106/72 (01/14 0300) SpO2:  [91 %-100 %] 100 % (01/14 0370) Arterial Line BP: (99-129)/(52-69) 117/61 (01/14 0500) FiO2 (%):  [50 %] 50 % (01/14 0613) Weight:  [98.9 kg] 98.9 kg (01/14 0500) Last BM Date: 02/09/20  Weight change: Filed Weights   02/09/20 0500 02/10/20 0458  02/11/20 0500  Weight: 103.8 kg 100.3 kg 98.9 kg    Intake/Output:   Intake/Output Summary (Last 24 hours) at 02/11/2020 0755 Last data filed at 02/11/2020 0700 Gross per 24 hour  Intake 4760.07 ml  Output 6141 ml  Net -1380.93 ml      Physical Exam   CVP 12-13 General: Awake on vent. . No resp difficulty HEENT: normal Neck: Trach. JVP difficult to assess.  Carotids 2+ bilat; no bruits. No lymphadenopathy or thryomegaly appreciated. Cor: PMI nondisplaced. Regular rate & rhythm. No rubs, gallops or murmurs. R  Axillary impella. Sternal VAC Lungs: clear Abdomen: soft, nontender, nondistended. No hepatosplenomegaly. No bruits or masses. Good bowel sounds. J/G tube Extremities: no cyanosis, clubbing, rash, R and LLE 2+ edema Neuro: On vent. Awake on vent. MAE x4.    Telemetry   NSR 90s with frequent runs of NSVT   Labs    CBC Recent Labs    02/10/20 0322 02/10/20 1600 02/11/20 0315  WBC 15.4* 16.5* 14.4*  NEUTROABS 11.5*  --  10.6*  HGB 9.1* 8.9* 9.1*  HCT 28.9* 28.8* 28.4*  MCV 89.5 90.0 89.3  PLT 292 322 488   Basic Metabolic Panel Recent Labs    02/10/20 0322 02/10/20 1600 02/11/20 0315  NA 133* 136 135  K 4.2  4.5 4.3  CL 101 103 101  CO2 '23 25 25  ' GLUCOSE 191* 183* 162*  BUN 28* 29* 30*  CREATININE 1.51* 1.42* 1.40*  CALCIUM 7.9* 7.8* 8.1*  MG 2.5*  --  2.4  PHOS 2.6 2.0* 2.1*   Liver Function Tests Recent Labs    02/10/20 1600 02/11/20 0315  ALBUMIN 1.7* 1.7*   No results for input(s): LIPASE, AMYLASE in the last 72 hours. Cardiac Enzymes No results for input(s): CKTOTAL, CKMB, CKMBINDEX, TROPONINI in the last 72 hours.  BNP: BNP (last 3 results) Recent Labs    01/04/2020 1619  BNP 577.5*    ProBNP (last 3 results) No results for input(s): PROBNP in the last 8760 hours.   D-Dimer No results for input(s): DDIMER in the last 72 hours. Hemoglobin A1C No results for input(s): HGBA1C in the last 72 hours. Fasting Lipid Panel No  results for input(s): CHOL, HDL, LDLCALC, TRIG, CHOLHDL, LDLDIRECT in the last 72 hours. Thyroid Function Tests No results for input(s): TSH, T4TOTAL, T3FREE, THYROIDAB in the last 72 hours.  Invalid input(s): FREET3  Other results:   Imaging    No results found.   Medications:     Scheduled Medications: . vitamin C  500 mg Per Tube TID  . aspirin  324 mg Per Tube Daily  . atorvastatin  80 mg Per Tube Daily  . busPIRone  10 mg Per Tube TID  . chlorhexidine gluconate (MEDLINE KIT)  15 mL Mouth Rinse BID  . Chlorhexidine Gluconate Cloth  6 each Topical Daily  . clonazepam  0.25 mg Per Tube BID  . darbepoetin (ARANESP) injection - NON-DIALYSIS  100 mcg Subcutaneous Q Sat-1800  . docusate  100 mg Per Tube BID  . feeding supplement (PROSource TF)  45 mL Per Tube TID  . fiber  1 packet Per Tube BID  . FLUoxetine  40 mg Per Tube BID  . mouth rinse  15 mL Mouth Rinse 10 times per day  . melatonin  5 mg Per Tube Daily  . midodrine  15 mg Per Tube TID WC  . multivitamin  1 tablet Per Tube QHS  . oxyCODONE  10 mg Per Tube Q6H  . pantoprazole (PROTONIX) IV  40 mg Intravenous Q12H  . polyethylene glycol  17 g Per Tube Daily  . sodium chloride flush  10-40 mL Intracatheter Q12H  . thiamine  100 mg Per Tube Daily  . valproic acid  250 mg Per Tube BID    Infusions: .  prismasol BGK 4/2.5 500 mL/hr at 02/10/20 2200  .  prismasol BGK 4/2.5 200 mL/hr at 02/11/20 0026  . sodium chloride Stopped (01/29/20 0955)  . sodium chloride    . amiodarone 60 mg/hr (02/11/20 0700)  . electrolyte-A Stopped (02/03/20 4431)  . epinephrine Stopped (02/08/20 0651)  . feeding supplement (PIVOT 1.5 CAL) 1,000 mL (02/11/20 0041)  . impella catheter heparin 50 unit/mL in dextrose 5%    . HYDROmorphone 4 mg/hr (02/11/20 0700)  . insulin 2.6 mL/hr at 02/11/20 0700  . lactated ringers 20 mL/hr at 02/11/20 0700  . milrinone 0.125 mcg/kg/min (02/11/20 0700)  . norepinephrine (LEVOPHED) Adult infusion 22  mcg/min (02/11/20 0700)  . prismasol BGK 4/2.5 1,500 mL/hr at 02/11/20 0239  . vasopressin 0.04 Units/min (02/11/20 0700)    PRN Medications: sodium chloride, Place/Maintain arterial line **AND** sodium chloride, artificial tears, bisacodyl **OR** bisacodyl, docusate, heparin, heparin, HYDROmorphone, lip balm, metoprolol tartrate, midazolam, polyethylene glycol, sodium chloride flush  Assessment/Plan   1. Cardiogenic shock/acute on chronic systolic CHF: Initially nonischemic cardiomyopathy. Possible familial cardiomyopathy as both parents had cardiomyopathy and died at around 6.However, [employer] gene testing did not show any common mutation for cardiomyopathy.  However, this admission noted to have severe 3 vessel disease so suspect component of ischemic cardiomyopathy. St Jude ICD. Echo in 8/20 with EF 15% and mildly decreased RV function. Echo this admission with EF < 20%, moderate LV dilation, RV mildly reduced, severe LAE, no significant MR.Low output HF with markedly low EF.  He has a long history of cardiomyopathy (20 yrs), tends to minimize symptoms.  Cardiorenal syndrome with creatinine up to 2.2,  stabilized on milrinone and Impella 5.5. SCr 1.44 day of CABG. CABG 12/15.  Post-op shock, back to OR 12/16 to open chest (chest wall compartment syndrome) and again to evacuate hematoma.  TEE post-op with severe RV dysfunction.  CVVHD initiated 12/19 for volume removal in setting of rising Scr/decreased UOP. Suspect ATN. S/p chest closure 12/23. Hemodynamics much worse on 12/24 with rising pressor demands. Co-ox 36%.  Bedside echo severe biventricular dysfunction with marked septal bounce. Chest re-opened 12/24.  Was atrial paced back to NSR on 12/26 with improvement in hemodynamics but back in atrial fibrillation on 12/27 and unable to pace out, DCCV to NSR on 12/28. Went back to OR 1/5 for chest closure but several hours later required emergent re-opening of chest at bedside to evacuate  hematoma. To OR again 1/10 with closure of chest with omental flap. 1/12 w/ frequent NSVT requiring amio rebolus. Able to wean down NE, now at 26 mcg. Off Epi.  Remains on milrinone 0.125 + VP 0.04 + Norepi 22. Dr [name] turned down Impella to P5.  Flow 3.4 No alarms. Co-ox good at 75%. CVP 12-13 but remains edematous (3rd spacing). - Continue CVVH.  - Continue to wean NE as tolerated (MAPs mid 70s).  - Continue milrinone at 0.125.  2. Atrial fibrillation: H/o PAF.  He was on dofetilide in the remote past but this was stopped due to noncompliance. He had an upper GI bleed from antral ulcers in 3/12. He was seen by GI and was deemed safe to restart anticoagulation as long as he remains on a PPI.  No apparent recurrence of AF until just prior to this admission, was cardioverted back to NSR in ER but went back into atrial fibrillation.  Post-op afib, DCCV to NSR/BiV pacing am 12/16 -> back to AF. Rapid atrial pacing back to SR on 12/26 -> back in AF on 12/27 and unable to pace out. 12/28 DCCV to NSR.   - In NSR with ongoing NSVT - Continue IV amiodarone at 60 mg/hr.   - Currently off systemic heparin with chest bleeding. Getting heparin through impella purge  - Did not have Maze with CABG.    3. CAD:   Cath this admission with severe 3 vessel disease. CABG x 4 on 12/15.  - Continue atorvastatin, ASA.  - continue current management 4. Acute on chronic hypoxemic respiratory failure: Vent per CCM.  Has tracheostomy.  - CCM following 5. AKI on CKD stage 3: Likely ATN/cardiorenal. Anuric. On CVVH.    - Pulling 50 cc/hr via CVVH 6. Diabetes: Insulin.  7. Hyponatremia: Resolved with CVVH.  8. Torsades/ICD shock: 12/7/212 in hospital event, in setting of severe hypokalemia and hypomagnesemia.   9. Gout: h/o gout. Complained of rt knee pain c/w previous flares - treated w/ prednisone burst (completed).  10: ID: completed  abx for PNA.  Afebrile.  Cultures NGTD.   11. FEN: On TFS s via J-tube. 12. Anemia:  Stable 9/1 today.  Transfuse < 7.5.  13. Stage II Pressure Ulcer- sacrum -Continue to reposition every 2 hours R/L  14. Pleural Effusion  - loculated rt apical pleural effusion noted on imaging.  - continue UF through CVVH  - PCCM following  15. VT - Having runs NSVT. Continue gtt at 60 hr - keep K > 4.0 and Mg > 2.0 - wean NE as tolerated  - appreciate EP    Amy Clegg, NP-C  02/11/2020 7:55 AM   Agree with above.   Awake on vent. Remains on Impella (turned down to P-5 this am), NE 22, VP and milrinone. Pressures much improved. Remains on CVVHD. Weigh down below baseline. CVP 12-13. Still with NSVT  General:  Awake on vent through trach HEENT: normal + cortrak Neck: supple. +trach. + HD cath Carotids 2+ bilat; no bruits. No lymphadenopathy or thryomegaly appreciated. Cor: PMI nondisplaced. Chest open with wound vacRegular rate & rhythm. No rubs, gallops or murmurs. Lungs: clear Abdomen: soft, nontender, nondistended. No hepatosplenomegaly. No bruits or masses. Good bowel sounds. Extremities: no cyanosis, clubbing, rash, 1-2+ edema Neuro: alert & orientedx3, cranial nerves grossly intact. moves all 4 extremities w/o difficulty. Affect pleasant  He remains tenuous but more awake and hemodynamically stable. Excellent pulsativity on a-line. Co-ox ok. Will wean impella to P-4 and follow. Continue to wean pressors as tolerated. Scheduled for pec flap on Monday and may be able to pull Impella at that time. EP starting lido for NSVT. D/w Dr. [name].  I d/w patient's wife at bedside I told her that he continues to make slow progress but it still very tenuous and requring a lot of support. We discussed the fact that we are doing everything possible for him but that there is still a chance he might not get through this. Palliative Care consulted to help keep family informed and continue to discuss [place].   CRITICAL CARE Performed by: Glori Bickers  Total critical care time: 35  minutes  Critical care time was exclusive of separately billable procedures and treating other patients.  Critical care was necessary to treat or prevent imminent or life-threatening deterioration.  Critical care was time spent personally by me (independent of midlevel providers or residents) on the following activities: development of treatment plan with patient and/or surrogate as well as nursing, discussions with consultants, evaluation of patient's response to treatment, examination of patient, obtaining history from patient or surrogate, ordering and performing treatments and interventions, ordering and review of laboratory studies, ordering and review of radiographic studies, pulse oximetry and re-evaluation of patient's condition.   Glori Bickers, MD  10:21 AM

## 2020-02-11 NOTE — Progress Notes (Signed)
TCTS BRIEF SICU PROGRESS NOTE  4 Days Post-Op  S/P Procedure(s) (LRB): STERNAL WASHOUT WITH WOUND VAC CHANGE (N/A) GREATER OMENTAL FLAP CLOSURE OF STERNUM (N/A) OPEN TRACHEOSTOMY USING #8 SHILEY (N/A) OPEN INSERTION OF 16 FR. BOLUS GASTROSTOMY TUBE (N/A) 16 FR JEJUNOSTOMY INSERTION  (N/A) TRANSESOPHAGEAL ECHOCARDIOGRAM (TEE) (N/A)   Stable day Decreased ectopy since lidocaine started NSR w/ stable hemodynamics but somewhat increased dose levophed, still on vasopressin and milrinone Impella P4 w/ stable flows O2 sats 98% on 40% FiO2 Tolerating tube feeds Tolerating CRRT  Plan: Continue current plan  Rexene Alberts, MD 02/11/2020 5:16 PM

## 2020-02-11 NOTE — Progress Notes (Signed)
Dr. Caryl Comes  Has seen and examined the patient and reviewed telemetry at length. Discussed with RN thoughts and plans as well as Dr. [name]. With increase in NSVT burden again will try lidocaine bolus 100mg  to repeat 50mg  in 38min if continued NSVT after 1st. If successful in minimizing VT burden would start gtt, if lidocaine does not seem to help would c/w amiodarone and bolus PRN, keep gtt at 60.  Primary management otherwise with CTS and AHF service.  Dr. Olin Pia full note to follow  Tommye Standard, PA-C

## 2020-02-11 NOTE — Progress Notes (Signed)
4 Days Post-Op Procedure(s) (LRB): STERNAL WASHOUT WITH WOUND VAC CHANGE (N/A) GREATER OMENTAL FLAP CLOSURE OF STERNUM (N/A) OPEN TRACHEOSTOMY USING #8 SHILEY (N/A) OPEN INSERTION OF 16 FR. BOLUS GASTROSTOMY TUBE (N/A) 16 FR JEJUNOSTOMY INSERTION  (N/A) TRANSESOPHAGEAL ECHOCARDIOGRAM (TEE) (N/A) Subjective: Pain across incisional area  Objective: Vital signs in last 24 hours: Temp:  [97.5 F (36.4 C)-99 F (37.2 C)] 99 F (37.2 C) (01/14 1128) Pulse Rate:  [69-97] 82 (01/14 1900) Cardiac Rhythm: Normal sinus rhythm;Ventricular tachycardia (01/14 0800) Resp:  [28-32] 29 (01/14 1612) BP: (89-118)/(58-83) 96/61 (01/14 1900) SpO2:  [96 %-100 %] 100 % (01/14 1900) Arterial Line BP: (81-141)/(44-87) 96/47 (01/14 1900) FiO2 (%):  [40 %-50 %] 40 % (01/14 1900) Weight:  [98.9 kg] 98.9 kg (01/14 0500)  Hemodynamic parameters for last 24 hours: CVP:  [6 mmHg-13 mmHg] 9 mmHg  Intake/Output from previous day: 01/13 0701 - 01/14 0700 In: 4760.1 [I.V.:2599.5; NG/GT:1630] Out: 5809 [Urine:50; Drains:380; Chest Tube:80] Intake/Output this shift: No intake/output data recorded.  General appearance: alert and no distress Neurologic: intact Heart: regular rate and rhythm, S1, S2 normal, no murmur, click, rub or gallop Lungs: clear to auscultation bilaterally Abdomen: soft, non-tender; bowel sounds normal; no masses,  no organomegaly Extremities: edema 1+ Wound: wound vac changed at bedside this pm; tolerated well  Lab Results: Recent Labs    02/10/20 1600 02/11/20 0315  WBC 16.5* 14.4*  HGB 8.9* 9.1*  HCT 28.8* 28.4*  PLT 322 334   BMET:  Recent Labs    02/11/20 0315 02/11/20 1622  NA 135 134*  K 4.3 4.7  CL 101 101  CO2 25 24  GLUCOSE 162* 213*  BUN 30* 33*  CREATININE 1.40* 1.47*  CALCIUM 8.1* 7.9*    PT/INR: No results for input(s): LABPROT, INR in the last 72 hours. ABG    Component Value Date/Time   PHART 7.417 02/09/2020 1513   HCO3 25.2 02/09/2020 1513    TCO2 26 02/09/2020 1513   ACIDBASEDEF 1.0 02/08/2020 0101   O2SAT 75.1 02/11/2020 0520   CBG (last 3)  Recent Labs    02/11/20 1501 02/11/20 1617 02/11/20 1805  GLUCAP 155* 177* 178*    Assessment/Plan: S/P Procedure(s) (LRB): STERNAL WASHOUT WITH WOUND VAC CHANGE (N/A) GREATER OMENTAL FLAP CLOSURE OF STERNUM (N/A) OPEN TRACHEOSTOMY USING #8 SHILEY (N/A) OPEN INSERTION OF 16 FR. BOLUS GASTROSTOMY TUBE (N/A) 16 FR JEJUNOSTOMY INSERTION  (N/A) TRANSESOPHAGEAL ECHOCARDIOGRAM (TEE) (N/A) Diuresis Plan skin closure Monday  Appreciate EP assistance.  Continue to slowly wean Impella-now at P4 with good BP   LOS: 43 days    [name] 02/11/2020

## 2020-02-11 NOTE — Progress Notes (Signed)
NAME:  Corey Palmer, MRN:  [id number], DOB:  Nov 09, 1970, LOS: 86 ADMISSION DATE:  01/02/2020, CONSULTATION DATE: 01/25/2020 REFERRING MD: Aundra Dubin, CHIEF COMPLAINT: Respiratory failure post Impella  HPI/course in hospital  50 year old man admitted with A. fib/RVR, torsades, cardiogenic shock [EF 15] requiring Impella, CABG x5, AKI requiring initiation of CRRT  Past Medical History:    has a past medical history of Anginal pain ([place]), Anxiety, Arthritis, Automatic implantable cardioverter-defibrillator in situ (2007), Bipolar disorder ([place]), CAD (coronary artery disease), Cancer ([place]) (2013), CHF (congestive heart failure) ([place]), Chondrocalcinosis of right knee (9/38/1829), Chronic systolic heart failure ([place]), Depression, Diabetes uncomplicated adult-type II, Dilated cardiomyopathy ([place]), Dyslipidemia, Dysrhythmia, GERD (gastroesophageal reflux disease), Gout, unspecified, Hepatic steatosis (2011), History of kidney stones, echocardiogram, Hypertension, Obesity (BMI 30.0-34.9), Pacemaker, Paroxysmal atrial fibrillation ([place]), Peptic ulcer, Shortness of breath, and Sleep apnea.  Significant Hospital Events:  12/2 Afib with RVR. Cardioverted in ED. AICD did not fire. Milrinone for low co ox.  12/7 ICD fired, Torsades- likely from low K of 2.8.  12/8 Cath- multivessel disease w/ low CO. Volume overloaded.  12/11 Underwent Impella 5.5 insertion for persistent symptoms of forward failure with low cardiac indices. 12/12 Back to the OR for displacement of Impella. Extubated.  12/15 CABG x4, with LIMA-LAD, SVG-PDA/PLV, SVG-OM 12/16 Emergently opened chest at bedside for tamponade, clot compressing right atrium  12/16 To OR for Exploration of chest due to bleeding  12/19 Started CRRT 12/23 Sternal Closure in OR 12/24 Worsening shock.  Chest reopened at bedside with improvement in hemodynamics.  No mediastinal hematoma. 12/25 A flutter > rapid a paced to sinus rhythm.  Remains on high-dose pressors,  inotropes 12/28 Weaning vasopressor requirements.  For chest washout today.  Back in atrial fibrillation, refractory to increased amiodarone and attempted overdrive pacing.  DCCV to NSR. Tolerating fluid removal via CRRT. 1/3: Appears euvolemic.  Adjusting glycemic control.  Chest still open so not weaning 1/4, spiking fever, Vanco meropenem resumed after culture sent from blood and sputum. 1/5 awaiting return to the OR for wound VAC dressing assessment fever curve and white blood cell count improving after antibiotics resumed the day prior, did have chest closed, developed tamponade physiology shortly after had to go back for emergent reopening of chest, return to intensive care and refractory shock 1/6: Wound VAC dressing replaced.  Still in shock.  Starting to wean sedation 1/7: Still on high-dose pressors  Consults:  PCCM, nephrology, cardiology, cardiothoracic surgery  Procedures:  12/2 R PICC >> 12/11 right ax impella 5.5 >> 12/15 ETT >> 12/15 right femoral arterial sheath >> 12/19 right femoral HD >> 12/23 chest tube-right pleural, left pleural , mediastinal  >> 1/10  Trach->1/11 1/10 PEG->1/11  Significant Diagnostic Tests:    Micro Data:  See micro tab  Antimicrobials:  See fever tab  Interval history:  Some persistent NSVT this am which seemed to correlate with agitation.    Objective   Blood pressure 112/83, pulse 89, temperature 98.4 F (36.9 C), temperature source Axillary, resp. rate (!) 28, height 6\' 3"  (1.905 m), weight 98.9 kg, SpO2 100 %. CVP:  [7 mmHg-13 mmHg] 13 mmHg  Vent Mode: PRVC FiO2 (%):  [40 %-50 %] 40 % Set Rate:  [28 bmp] 28 bmp Vt Set:  [510 mL] 510 mL PEEP:  [5 cmH20] 5 cmH20 Plateau Pressure:  [20 cmH20-25 cmH20] 23 cmH20   Intake/Output Summary (Last 24 hours) at 02/11/2020 1028 Last data filed at 02/11/2020 0950 Gross per 24 hour  Intake  4260.46 ml  Output 5537 ml  Net -1276.54 ml   Filed Weights   02/09/20 0500 02/10/20 0458  02/11/20 0500  Weight: 103.8 kg 100.3 kg 98.9 kg   CVP:  [7 mmHg-13 mmHg] 13 mmHg  Examination: Constitutional: no acute distress lying in bed  Eyes: EOMI, pupils equal Ears, nose, mouth, and throat: trach in place with some surrounding dried blood clots, no active oozing Cardiovascular: distant, ext warm, sternal vac in place Respiratory: weak inspiratory efforts, no wheezing or accessory muscle use Gastrointestinal: soft, +BS, rectal tube in place Skin: No rashes, normal turgor Neurologic: able to move all 4 ext briskly to command, profoundly weak Psychiatric: RASS 0  SvO2 75% on P6, lowered to P4 today, good pulsatility BMP, CBG benign WBC/Hgb/Plt stable Afebrile, off abx KUB benign CXR stable vascular congestion and loculations on R chest   Assessment & Plan:   Acute hypoxic and hypercapnic respiratory failure requiring mechanical ventilation following cardiac surgery, post tracheostomy Persistent small right sided loculated pleural effusions- doubt contributing to  Now with recurrrent NSVT starting lidocaine in addition to amiodraone Cardiogenic shock s/p high risk CABG complicated by tamponade requiring open chest now post omental flap closure, vac on impella wean Vasoplegia- from prolonged critical illness on midodrine Dysphagia- s/p GJ tube Cardiorenal syndrome- now on CRRT Prolonged QT- complicates PAD control Persistent agitation/anxiety/pain- weaned to PO Profound muscular deconditioning postop due to prolonged critical illness Afib PTA  - Would like to see ectopy under better control before further stressing body with PS trials - AC resumption per TCTS and cardiology - Increase klonipin slightly - Wean dilaudid drip as able - Increase midodrine to 30 tid - Inotrope, pressor, and impella wean per TCTS and CHF teams - CRRT per nephrology and CHF teams - Antiarrhythmics per EP team - Wound care and eventual muscle flap per TCTS and plastics  Daily Goals  Checklist  Pain/Anxiety/Delirium protocol (if indicated): PAD protocol VAP protocol (if indicated): bundle in place. DVT prophylaxis:  Full dose asa Nutrition Status: TF GI prophylaxis: pantoprazole Glucose control: insulin gtt Mobility/therapy needs: bed rest  Code Status: full  Family Communication: per primary Disposition: ICU   Patient critically ill due to persistent shock and respiratory failure Interventions to address this today vent wean, pressor titration, impella titration Risk of deterioration without these interventions is high  I personally spent 34 minutes providing critical care not including any separately billable procedures  Erskine Emery MD [facility] 02/11/2020 10:55 AM Personal pager: #[phone] If unanswered, please page CCM On-call: [phone]

## 2020-02-11 NOTE — Progress Notes (Signed)
This chaplain attempted F/U spiritual care with the Pt. wife-[name].  The chaplain understands [name] left the Pt. bedside.  The chaplain will F/U on Monday.

## 2020-02-11 NOTE — Progress Notes (Signed)
Corey Palmer for Heparin Indication: Impella 5.5  Allergies  Allergen Reactions  . Orange Fruit Anaphylaxis, Hives and Other (See Comments)    Per Pt- Blisters around lips and Hives all over, also  . Penicillins Anaphylaxis    Did it involve swelling of the face/tongue/throat, SOB, or low BP? Yes Did it involve sudden or severe rash/hives, skin peeling, or any reaction on the inside of your mouth or nose? Yes Did you need to seek medical attention at a hospital or doctor's office? No When did it last happen?childhood If all above answers are "NO", may proceed with cephalosporin use.  Corey Palmer [Empagliflozin] Itching  . Basaglar Kwikpen [Insulin Glargine] Nausea And Vomiting    Patient Measurements: Height: 6\' 3"  (190.5 cm) Weight: 98.9 kg (218 lb 0.6 oz) IBW/kg (Calculated) : 84.5 Heparin dosing wt: 101 kg  Vital Signs: Temp: 99 F (37.2 C) (01/14 1128) Temp Source: Axillary (01/14 1128) BP: 103/68 (01/14 1220) Pulse Rate: 91 (01/14 1220)  Labs: Recent Labs    02/09/20 0430 02/09/20 1513 02/10/20 0322 02/10/20 1600 02/11/20 0315  HGB 7.8*   < > 9.1* 8.9* 9.1*  HCT 23.5*   < > 28.9* 28.8* 28.4*  PLT 237   < > 292 322 334  HEPARINUNFRC <0.10*  --  <0.10*  --  <0.10*  CREATININE 1.20   < > 1.51* 1.42* 1.40*   < > = values in this interval not displayed.    Estimated Creatinine Clearance: 74.6 mL/min (A) (by C-G formula based on SCr of 1.4 mg/dL (H)).   Assessment: 50 yo male s/p impella 5.5 placement. Pt started on heparinized purge and systemic heparin. Pt now s/p CABG 12/15 with Impella remaining in place c/b significant bleeding and hematoma requiring opened chest and reexploration in OR 12/16. Chest closed on 12/23, re-opened on 12/24. Underwent cardioversion in OR 12/28, s/p sternal wash out/application of wound VAC.   Underwent sternal washout, wound VAC change, followed by sternal closure on 1/5. Chest was re-opened on  1/5 given compromised hemodynamics.   Impella on P7 with stable purge pressures (400-500), purge flow rate 8.2 ml/hr = 410 units of heparin per hour (only on heparin in purge solution for now). Hgb 9.1, plt 334 (fairly stable), LDH 412.  Heparin level remains undetectable as expected.  Goal of Therapy:  Monitor platelets by anticoagulation protocol: Yes   Plan:  Getting heparin (410 units/hr) in purge solution. Not adding systemic heparin for now. F/u daily CBC, heparin level.  [name], Corey Palmer, BCCP Clinical Pharmacist  02/11/2020 12:53 PM   [facility] pharmacy phone numbers are listed on [email or web address]

## 2020-02-11 NOTE — Progress Notes (Signed)
Shift Summary:  N: Remains on Dilauded gtt with prn bolus from bag 1mg  and prn ativan for agitation. Prn dilauded given x2 and prn ativan given x1 with bath. Follows commands, mouths needs, PERRLA, afebrile on prismaflex warmer.   R: Trached, RT weaned Fi02 from 50% to 40%, tolerated well. Requires frequent suctioning, pink tinged. Ronchi throughout. Routine oral care completed.   CV: NSR in 80s, this AM frequent runs of self-limiting VT. Lidocaine bolus given 100mg  x1, 50mg  x1, started lidocaine gtt and amio gtt remains. Remains having self-limiting beats of VT, but resolution of frequent runs. MAP goal 65 or greater managed with vaso and titrating levophed, required increase in levo with decreasing impella support. Impella started shift at P6, dropped to p5 by MD and again to p4 this afternoon. Cassette and purge bag changed, no suction events. No changed to milrinone gtt. CVP 8-10. Minimal chest tube drainage, 101mL this shift, dark red.   GU: condom cath remains on, 50cc output this shift. CRRT goal -50-86mL/hr (616mL per shift), no bath changes or rate changes. Filter changed at 1700 for high filter pressures. Ending shift -541mL.   GI: G drain in place with 255mL output this shift. J tube in place infusing with tube feeds at goal. Insulin gtt remains on, escalating per [employer] for high BG. FMS in place, 314mL output. Active bowel tones, nondistended, no complaints of N/V or pain.   No labs of concern, slight increase in K from AM labs. H&H stable.  Dressings changed, wound vac to open chest changed at bedside by Dr. [name].  All lines in place necessary.  Wife [name] at bedside and supportive of care. Expresses frustration and anger with staff communication of patient's status. See palliative care note from 1/13.   Delora Fuel., RN

## 2020-02-11 NOTE — Progress Notes (Signed)
[facility] Progress Note    Assessment/ Plan:    60M with familial cardiomyopathy EF20% s/p redo CABG 99/24/26 complicated by persistent shock, multiple attempts at sternal closure with formation of chest hematoma and tamponade, Afib with RVR, and AKI requiring CRRT.    #AKI/CKD stage III- presumably due to ischemic ATN in setting of cardiogenic shock s/p CABG.  CRRT started 01/16/20. All fluids 4K/2.5Ca.  Right femoral HD catheter placed 01/16/20 switched to [place] on 01/28/20. Replete K prn - continue CRRT--> goal net neg 50-100cc/hr  # CAD s/p CABG complicated by cardiogenic shock and post-op hematoma. secondary closure on 12/23 then had to be re opened.  Went to the OR for washout on 12/28, bedside washout 02/01/19.  OR 02/27/2020 for closure, then had emergent reopening at the bedside 02/06/2020 d/t anterior chest hematoma again. OR for chest closure on 1/10. -pec flap closure planned for mon. C/w UF  # Cardiogenic shock- underlying familial cardiomyopathy s/p redo CABG, EF ~20%. Remains on pressors: levo and vaso. Also on milrinone. Impella weaned to P4. Continue mgmt per primary. Midodrine increased to 30 tid  # ABLA- s/p postoperative bleeding into chest cavity s/p re-exploration for tamponade/hematoma. Received multiple blood products  # Atrial fibrillation - s/p DCCV on 12/16.  On amiodarone.  Normal sinus rhythm currently  # VDRF- per primary svc. Trach/peg 1/10  #-Leukocytosis/ new fever: cultures redrawn 02/01/20, s/p vanc and meropenem  # R loculated pleural effusion: for CT chest on 1/6 with contrast which is necessary for clinical decision making.  #NSVT: ep on board, lido  Subjective:     No acute events. Seen on crrt. Discussed with wife at the bedside.still with nsvt. Pall care consulted   Objective:   BP 103/68   Pulse 91   Temp 99 F (37.2 C) (Axillary)   Resp (!) 29   Ht '6\' 3"'  (1.905 m)   Wt 98.9 kg   SpO2 99%   BMI 27.25 kg/m    Intake/Output Summary (Last 24 hours) at 02/11/2020 1308 Last data filed at 02/11/2020 1200 Gross per 24 hour  Intake 4639.6 ml  Output 5770 ml  Net -1130.4 ml   Weight change: -1.4 kg  Physical Exam: Gen: nad CVS: reg rate CHEST: woundvac foam in place, R [place] Impella Resp: trach Abd: no distention Ext: anasarca Neuro: follows commands, alert ACCESS: R IJ nontunneled HD cath  Imaging: DG Chest 1 View  Result Date: 02/10/2020 CLINICAL DATA:  Open-heart surgery.  Chest tube. EXAM: CHEST  1 VIEW COMPARISON:  02/08/2020. FINDINGS: Tracheostomy tube, right IJ line, right PICC line, mediastinal drainage catheter, bilateral chest tubes in stable position. Cardiac pacer and Impella device in stable position. Stable cardiomegaly. Persistent diffuse bilateral pulmonary infiltrates/edema. Stable right pleural effusion. Left pleural effusion cannot be excluded. Stable right apical prominent pleural based density. This could be loculated fluid. No pneumothorax. IMPRESSION: 1. Lines and tubes including bilateral chest tubes in stable position. No pneumothorax. 2. Cardiac pacer and Impella device in stable position. Stable cardiomegaly. Persistent diffuse bilateral pulmonary infiltrates/edema. Stable right pleural effusion. Left pleural effusion cannot be excluded. Right apical prominent pleural based density, possibly loculated fluid, again noted without interim change. Electronically Signed   By: Marcello Moores  Register   On: 02/10/2020 06:33   DG CHEST PORT 1 VIEW  Result Date: 02/11/2020 CLINICAL DATA:  Tracheostomy.  Chest tube. EXAM: PORTABLE CHEST 1 VIEW COMPARISON:  02/10/2020 FINDINGS: Tracheostomy remains in good position. Bilateral chest tubes in place. No pneumothorax.  Impella device in the left ventricle unchanged. Right jugular central venous catheter tip in the SVC unchanged. Right upper lobe opacity unchanged possible extrapleural hematoma. Diffuse bilateral airspace disease with vascular  congestion likely due to pulmonary edema. Bibasilar airspace disease unchanged. Small bilateral pleural effusions unchanged. IMPRESSION: No interval change in no new findings. Electronically Signed   By: Franchot Gallo M.D.   On: 02/11/2020 08:39   DG Abd Portable 1V  Result Date: 02/11/2020 CLINICAL DATA:  Abdominal pain. EXAM: PORTABLE ABDOMEN - 1 VIEW COMPARISON:  02/03/2020 FINDINGS: Normal bowel gas pattern.  No dilated bowel loops identified. Skin staples in the midline. Surgical clips in the right upper quadrant. Possible surgical drain overlying the left abdomen. No acute skeletal abnormality. IMPRESSION: Postop laparotomy changes.  Negative for bowel obstruction or ileus. Electronically Signed   By: Franchot Gallo M.D.   On: 02/11/2020 08:41    Labs: BMET Recent Labs  Lab 02/08/20 0400 02/08/20 1459 02/08/20 1502 02/09/20 0430 02/09/20 1513 02/09/20 1519 02/10/20 0322 02/10/20 1600 02/11/20 0315  NA 133*   < > 134* 135 137 135 133* 136 135  K 4.7   < > 4.4 3.6 4.3 4.3 4.2 4.5 4.3  CL 102  --  102 104  --  103 101 103 101  CO2 23  --  24 22  --  '24 23 25 25  ' GLUCOSE 137*  --  199* 238*  --  181* 191* 183* 162*  BUN 21*  --  21* 20  --  23* 28* 29* 30*  CREATININE 1.50*  --  1.45* 1.20  --  1.38* 1.51* 1.42* 1.40*  CALCIUM 7.8*  --  7.6* 6.8*  --  7.9* 7.9* 7.8* 8.1*  PHOS 3.7  --  3.3 2.7  --  2.5 2.6 2.0* 2.1*   < > = values in this interval not displayed.   CBC Recent Labs  Lab 02/08/20 0400 02/08/20 1459 02/09/20 0430 02/09/20 1513 02/09/20 1519 02/10/20 0322 02/10/20 1600 02/11/20 0315  WBC 16.1*   < > 14.5*  --  17.2* 15.4* 16.5* 14.4*  NEUTROABS 12.9*  --  10.9*  --   --  11.5*  --  10.6*  HGB 7.9*   < > 7.8*   < > 9.7* 9.1* 8.9* 9.1*  HCT 25.0*   < > 23.5*   < > 30.1* 28.9* 28.8* 28.4*  MCV 91.2   < > 88.7  --  88.0 89.5 90.0 89.3  PLT 229   < > 237  --  278 292 322 334   < > = values in this interval not displayed.    Medications:    . vitamin C   500 mg Per Tube TID  . aspirin  324 mg Per Tube Daily  . atorvastatin  80 mg Per Tube Daily  . busPIRone  10 mg Per Tube TID  . chlorhexidine gluconate (MEDLINE KIT)  15 mL Mouth Rinse BID  . Chlorhexidine Gluconate Cloth  6 each Topical Daily  . clonazepam  0.5 mg Per Tube BID  . darbepoetin (ARANESP) injection - NON-DIALYSIS  100 mcg Subcutaneous Q Sat-1800  . docusate  100 mg Per Tube BID  . feeding supplement (PROSource TF)  45 mL Per Tube TID  . fiber  1 packet Per Tube BID  . FLUoxetine  40 mg Per Tube BID  . mouth rinse  15 mL Mouth Rinse 10 times per day  . melatonin  5 mg Per Tube  Daily  . midodrine  30 mg Per Tube TID WC  . multivitamin  1 tablet Per Tube QHS  . oxyCODONE  10 mg Per Tube Q6H  . pantoprazole (PROTONIX) IV  40 mg Intravenous Q12H  . polyethylene glycol  17 g Per Tube Daily  . sodium chloride flush  10-40 mL Intracatheter Q12H  . thiamine  100 mg Per Tube Daily  . valproic acid  250 mg Per Tube BID      Gean Quint  02/11/2020, 1:08 PM

## 2020-02-12 ENCOUNTER — Inpatient Hospital Stay (HOSPITAL_COMMUNITY): Payer: BC Managed Care – PPO

## 2020-02-12 DIAGNOSIS — I509 Heart failure, unspecified: Secondary | ICD-10-CM | POA: Diagnosis not present

## 2020-02-12 DIAGNOSIS — Z7189 Other specified counseling: Secondary | ICD-10-CM

## 2020-02-12 DIAGNOSIS — Z789 Other specified health status: Secondary | ICD-10-CM

## 2020-02-12 DIAGNOSIS — Z95811 Presence of heart assist device: Secondary | ICD-10-CM | POA: Diagnosis not present

## 2020-02-12 DIAGNOSIS — R570 Cardiogenic shock: Secondary | ICD-10-CM | POA: Diagnosis not present

## 2020-02-12 DIAGNOSIS — Z515 Encounter for palliative care: Secondary | ICD-10-CM

## 2020-02-12 DIAGNOSIS — J9601 Acute respiratory failure with hypoxia: Secondary | ICD-10-CM | POA: Diagnosis not present

## 2020-02-12 DIAGNOSIS — N179 Acute kidney failure, unspecified: Secondary | ICD-10-CM | POA: Diagnosis not present

## 2020-02-12 LAB — RENAL FUNCTION PANEL
Albumin: 1.8 g/dL — ABNORMAL LOW (ref 3.5–5.0)
Albumin: 1.7 g/dL — ABNORMAL LOW (ref 3.5–5.0)
Anion gap: 10 (ref 5–15)
Anion gap: 9 (ref 5–15)
BUN: 35 mg/dL — ABNORMAL HIGH (ref 6–20)
BUN: 36 mg/dL — ABNORMAL HIGH (ref 6–20)
CO2: 23 mmol/L (ref 22–32)
CO2: 23 mmol/L (ref 22–32)
Calcium: 8.3 mg/dL — ABNORMAL LOW (ref 8.9–10.3)
Calcium: 7.9 mg/dL — ABNORMAL LOW (ref 8.9–10.3)
Chloride: 100 mmol/L (ref 98–111)
Chloride: 101 mmol/L (ref 98–111)
Creatinine, Ser: 1.41 mg/dL — ABNORMAL HIGH (ref 0.61–1.24)
Creatinine, Ser: 1.29 mg/dL — ABNORMAL HIGH (ref 0.61–1.24)
GFR, Estimated: >60 mL/min (ref >60)
GFR, Estimated: >60 mL/min (ref >60)
Glucose, Bld: 126 mg/dL — ABNORMAL HIGH (ref 70–99)
Glucose, Bld: 159 mg/dL — ABNORMAL HIGH (ref 70–99)
Phosphorus: 2.2 mg/dL — ABNORMAL LOW (ref 2.5–4.6)
Phosphorus: 2.5 mg/dL (ref 2.5–4.6)
Potassium: 4.5 mmol/L (ref 3.5–5.1)
Potassium: 4.7 mmol/L (ref 3.5–5.1)
Sodium: 133 mmol/L — ABNORMAL LOW (ref 135–145)
Sodium: 133 mmol/L — ABNORMAL LOW (ref 135–145)

## 2020-02-12 LAB — CBC WITH DIFFERENTIAL/PLATELET
Abs Immature Granulocytes: 0.2 10*3/uL — ABNORMAL HIGH (ref 0.00–0.07)
Basophils Absolute: 0.1 10*3/uL (ref 0.0–0.1)
Basophils Relative: 1 %
Eosinophils Absolute: 0.7 10*3/uL — ABNORMAL HIGH (ref 0.0–0.5)
Eosinophils Relative: 4 %
HCT: 29.3 % — ABNORMAL LOW (ref 39.0–52.0)
Hemoglobin: 9.5 g/dL — ABNORMAL LOW (ref 13.0–17.0)
Immature Granulocytes: 1 %
Lymphocytes Relative: 8 %
Lymphs Abs: 1.4 10*3/uL (ref 0.7–4.0)
MCH: 28.8 pg (ref 26.0–34.0)
MCHC: 32.4 g/dL (ref 30.0–36.0)
MCV: 88.8 fL (ref 80.0–100.0)
Monocytes Absolute: 2.2 10*3/uL — ABNORMAL HIGH (ref 0.1–1.0)
Monocytes Relative: 13 %
Neutro Abs: 12.1 10*3/uL — ABNORMAL HIGH (ref 1.7–7.7)
Neutrophils Relative %: 73 %
Platelets: 445 10*3/uL — ABNORMAL HIGH (ref 150–400)
RBC: 3.3 MIL/uL — ABNORMAL LOW (ref 4.22–5.81)
RDW: 20.2 % — ABNORMAL HIGH (ref 11.5–15.5)
WBC: 16.6 10*3/uL — ABNORMAL HIGH (ref 4.0–10.5)
nRBC: 0.2 % (ref 0.0–0.2)

## 2020-02-12 LAB — MAGNESIUM: Magnesium: 2.5 mg/dL — ABNORMAL HIGH (ref 1.7–2.4)

## 2020-02-12 LAB — GLUCOSE, CAPILLARY
Glucose-Capillary: 124 mg/dL — ABNORMAL HIGH (ref 70–99)
Glucose-Capillary: 127 mg/dL — ABNORMAL HIGH (ref 70–99)
Glucose-Capillary: 128 mg/dL — ABNORMAL HIGH (ref 70–99)
Glucose-Capillary: 133 mg/dL — ABNORMAL HIGH (ref 70–99)
Glucose-Capillary: 139 mg/dL — ABNORMAL HIGH (ref 70–99)
Glucose-Capillary: 140 mg/dL — ABNORMAL HIGH (ref 70–99)
Glucose-Capillary: 162 mg/dL — ABNORMAL HIGH (ref 70–99)
Glucose-Capillary: 165 mg/dL — ABNORMAL HIGH (ref 70–99)
Glucose-Capillary: 168 mg/dL — ABNORMAL HIGH (ref 70–99)
Glucose-Capillary: 182 mg/dL — ABNORMAL HIGH (ref 70–99)
Glucose-Capillary: 186 mg/dL — ABNORMAL HIGH (ref 70–99)
Glucose-Capillary: 189 mg/dL — ABNORMAL HIGH (ref 70–99)
Glucose-Capillary: 197 mg/dL — ABNORMAL HIGH (ref 70–99)
Glucose-Capillary: 197 mg/dL — ABNORMAL HIGH (ref 70–99)
Glucose-Capillary: 219 mg/dL — ABNORMAL HIGH (ref 70–99)
Glucose-Capillary: 240 mg/dL — ABNORMAL HIGH (ref 70–99)
Glucose-Capillary: 99 mg/dL (ref 70–99)

## 2020-02-12 LAB — COOXEMETRY PANEL
Carboxyhemoglobin: 1.7 % — ABNORMAL HIGH (ref 0.5–1.5)
Methemoglobin: 1 % (ref 0.0–1.5)
O2 Saturation: 70.5 %
Total hemoglobin: 7.9 g/dL — ABNORMAL LOW (ref 12.0–16.0)

## 2020-02-12 LAB — HEPARIN LEVEL (UNFRACTIONATED): Heparin Unfractionated: <0.1 IU/mL — ABNORMAL LOW (ref 0.30–0.70)

## 2020-02-12 LAB — LACTATE DEHYDROGENASE: LDH: 437 U/L — ABNORMAL HIGH (ref 98–192)

## 2020-02-12 MED ORDER — FENTANYL CITRATE (PF) 100 MCG/2ML IJ SOLN
INTRAMUSCULAR | Status: AC
  Administered 2020-02-12: 100 ug
  Filled 2020-02-12: qty 2

## 2020-02-12 MED ORDER — FENTANYL CITRATE (PF) 100 MCG/2ML IJ SOLN
25.0000 ug | INTRAMUSCULAR | Status: AC | PRN
  Administered 2020-02-13 (×2): 25 ug via INTRAVENOUS
  Filled 2020-02-12 (×2): qty 2

## 2020-02-12 MED ORDER — LORAZEPAM 2 MG/ML IJ SOLN
0.5000 mg | Freq: Once | INTRAMUSCULAR | Status: AC
  Administered 2020-02-12: 0.5 mg via INTRAVENOUS
  Filled 2020-02-12: qty 1

## 2020-02-12 NOTE — Progress Notes (Signed)
[address]       [place] [address]             [phone]        CARDIOTHORACIC SURGERY PROGRESS NOTE   R5 Days Post-Op Procedure(s) (LRB): STERNAL WASHOUT WITH WOUND VAC CHANGE (N/A) GREATER OMENTAL FLAP CLOSURE OF STERNUM (N/A) OPEN TRACHEOSTOMY USING #8 SHILEY (N/A) OPEN INSERTION OF 16 FR. BOLUS GASTROSTOMY TUBE (N/A) 16 FR JEJUNOSTOMY INSERTION  (N/A) TRANSESOPHAGEAL ECHOCARDIOGRAM (TEE) (N/A)  Subjective: Awake and alert on vent.  Reportedly no arrhythmias overnight.  BP up and down.  Complains of abdominal pain  Objective: Vital signs: BP Readings from Last 1 Encounters:  02/12/20 107/73   Pulse Readings from Last 1 Encounters:  02/12/20 85   Resp Readings from Last 1 Encounters:  02/12/20 (!) 36   Temp Readings from Last 1 Encounters:  02/12/20 98.2 F (36.8 C)    Hemodynamics: CVP:  [6 mmHg-17 mmHg] 15 mmHg  Impella flow 2.1 L/min on P4, no alarms, pulsatile Aline Mixed venous co-ox 70%  Physical Exam:  Rhythm:   sinus  Breath sounds: Coarse but clear  Heart sounds:  RRR  Incisions:  Wound VAC intact, flat  Abdomen:  Soft, non-distended, moderately tender esp left side  Extremities:  Warm, adequately-perfused    Intake/Output from previous day: 01/14 0701 - 01/15 0700 In: 5552.4 [I.V.:2919.9; NG/GT:1680] Out: 6732 [Urine:70; Drains:275; Stool:350; Chest Tube:70] Intake/Output this shift: Total I/O In: 837.8 [I.V.:452.4; Other:175.4; NG/GT:210] Out: 1014 [Other:1014]  Lab Results:  CBC: Recent Labs    02/11/20 0315 02/12/20 0318  WBC 14.4* 16.6*  HGB 9.1* 9.5*  HCT 28.4* 29.3*  PLT 334 445*    BMET:  Recent Labs    02/11/20 1622 02/12/20 0318  NA 134* 133*  K 4.7 4.5  CL 101 100  CO2 24 23  GLUCOSE 213* 126*  BUN 33* 35*  CREATININE 1.47* 1.41*  CALCIUM 7.9* 8.3*     PT/INR:  No results for input(s): LABPROT, INR in the last 72 hours.  CBG (last 3)  Recent Labs    02/12/20 0058  02/12/20 0343 02/12/20 0509  GLUCAP 189* 128* 240*    ABG    Component Value Date/Time   PHART 7.417 02/09/2020 1513   PCO2ART 39.1 02/09/2020 1513   PO2ART 78 (L) 02/09/2020 1513   HCO3 25.2 02/09/2020 1513   TCO2 26 02/09/2020 1513   ACIDBASEDEF 1.0 02/08/2020 0101   O2SAT 70.5 02/12/2020 0511    CXR: PORTABLE CHEST 1 VIEW  COMPARISON:  02/11/2020  FINDINGS: Single lead left-sided pacemaker unchanged. Impella device unchanged. Right IJ central venous catheter has tip over the SVC. Right-sided PICC line unchanged with tip over the SVC. Bilateral chest tubes unchanged.  Lungs are hypoinflated demonstrate mild hazy perihilar opacification likely mild vascular congestion without significant change. Persistent elliptical density over the periphery of the right upper lung suggesting loculated pleural fluid unchanged. No pneumothorax. Mild stable cardiomegaly. Remainder of the exam is unchanged.  IMPRESSION: 1. Hypoinflation with stable cardiomegaly and suggestion of mild vascular congestion. Stable elliptical density over the periphery of the right upper lung likely loculated pleural fluid. 2. Tubes and lines as described.   Electronically Signed   By: Marin Olp M.D.   On: 02/12/2020 09:03   Assessment/Plan: S/P Procedure(s) (LRB): STERNAL WASHOUT WITH WOUND VAC CHANGE (N/A) GREATER OMENTAL FLAP CLOSURE OF STERNUM (N/A) OPEN TRACHEOSTOMY USING #8 SHILEY (N/A) OPEN INSERTION OF 16 FR. BOLUS  GASTROSTOMY TUBE (N/A) 16 FR JEJUNOSTOMY INSERTION  (N/A) TRANSESOPHAGEAL ECHOCARDIOGRAM (TEE) (N/A)  Overall clinically stable although now complaining of abdominal pain, CT scan ordered Continue current support  Rexene Alberts, MD 02/12/2020 11:22 AM

## 2020-02-12 NOTE — Progress Notes (Signed)
Continuous VT noted on bedside ekg, pulsatile a line, and no loss of consciousness.  Dr Haroldine Laws notified of events.  Dr Marcelle Smiling cardiology at bedside.  Amio 150mg  bolus given per DB.  Impella up to P7 per order.  Versed 2mg  and Fentanyl 100 mcg given.  Shock 200Jx1 with return to SR by Dr Marcelle Smiling.  Pt returned to P5 per DB.  Will continue to closely monitor.

## 2020-02-12 NOTE — Progress Notes (Signed)
Assisted family with tele-visit via elink 

## 2020-02-12 NOTE — Progress Notes (Signed)
NAME:  Corey Palmer, MRN:  [id number], DOB:  1970-07-07, LOS: 51 ADMISSION DATE:  01/25/2020, CONSULTATION DATE: 01/20/2020 REFERRING MD: Aundra Dubin, CHIEF COMPLAINT: Respiratory failure post Impella  HPI/course in hospital  50 year old man admitted with A. fib/RVR, torsades, cardiogenic shock [EF 15] requiring Impella, CABG x5, AKI requiring initiation of CRRT  Past Medical History:    has a past medical history of Anginal pain ([place]), Anxiety, Arthritis, Automatic implantable cardioverter-defibrillator in situ (2007), Bipolar disorder ([place]), CAD (coronary artery disease), Cancer ([place]) (2013), CHF (congestive heart failure) ([place]), Chondrocalcinosis of right knee (2/35/3614), Chronic systolic heart failure ([place]), Depression, Diabetes uncomplicated adult-type II, Dilated cardiomyopathy ([place]), Dyslipidemia, Dysrhythmia, GERD (gastroesophageal reflux disease), Gout, unspecified, Hepatic steatosis (2011), History of kidney stones, echocardiogram, Hypertension, Obesity (BMI 30.0-34.9), Pacemaker, Paroxysmal atrial fibrillation ([place]), Peptic ulcer, Shortness of breath, and Sleep apnea.  Significant Hospital Events:  12/2 Afib with RVR. Cardioverted in ED. AICD did not fire. Milrinone for low co ox.  12/7 ICD fired, Torsades- likely from low K of 2.8.  12/8 Cath- multivessel disease w/ low CO. Volume overloaded.  12/11 Underwent Impella 5.5 insertion for persistent symptoms of forward failure with low cardiac indices. 12/12 Back to the OR for displacement of Impella. Extubated.  12/15 CABG x4, with LIMA-LAD, SVG-PDA/PLV, SVG-OM 12/16 Emergently opened chest at bedside for tamponade, clot compressing right atrium  12/16 To OR for Exploration of chest due to bleeding  12/19 Started CRRT 12/23 Sternal Closure in OR 12/24 Worsening shock.  Chest reopened at bedside with improvement in hemodynamics.  No mediastinal hematoma. 12/25 A flutter > rapid a paced to sinus rhythm.  Remains on high-dose pressors,  inotropes 12/28 Weaning vasopressor requirements.  For chest washout today.  Back in atrial fibrillation, refractory to increased amiodarone and attempted overdrive pacing.  DCCV to NSR. Tolerating fluid removal via CRRT. 1/3: Appears euvolemic.  Adjusting glycemic control.  Chest still open so not weaning 1/4, spiking fever, Vanco meropenem resumed after culture sent from blood and sputum. 1/5 awaiting return to the OR for wound VAC dressing assessment fever curve and white blood cell count improving after antibiotics resumed the day prior, did have chest closed, developed tamponade physiology shortly after had to go back for emergent reopening of chest, return to intensive care and refractory shock 1/6: Wound VAC dressing replaced.  Still in shock.  Starting to wean sedation 1/7: Still on high-dose pressors  Consults:  PCCM, nephrology, cardiology, cardiothoracic surgery  Procedures:  12/2 R PICC >> 12/11 right ax impella 5.5 >> 12/15 ETT >> 12/15 right femoral arterial sheath >> 12/19 right femoral HD >> 12/23 chest tube-right pleural, left pleural , mediastinal  >> 1/10  Trach->1/11 1/10 PEG->1/11  Significant Diagnostic Tests:    Micro Data:  See micro tab  Antimicrobials:  See fever tab  Interval history:  No events. Weaning lidocaine gtt. C/o belly pain, new. BM have been sluggish even with rectal tube.  Objective   Blood pressure (!) 119/57, pulse 88, temperature 97.9 F (36.6 C), temperature source Axillary, resp. rate (!) 36, height 6\' 3"  (1.905 m), weight 98.3 kg, SpO2 96 %. CVP:  [6 mmHg-17 mmHg] 14 mmHg  Vent Mode: PRVC FiO2 (%):  [40 %] 40 % Set Rate:  [28 bmp] 28 bmp Vt Set:  [510 mL] 510 mL PEEP:  [5 cmH20] 5 cmH20 Plateau Pressure:  [17 cmH20-21 cmH20] 21 cmH20   Intake/Output Summary (Last 24 hours) at 02/12/2020 0944 Last data filed at 02/12/2020 0800 Gross per  24 hour  Intake 5340.12 ml  Output 6256 ml  Net -915.88 ml   Filed Weights   02/10/20  0458 02/11/20 0500 02/12/20 0500  Weight: 100.3 kg 98.9 kg 98.3 kg   CVP:  [6 mmHg-17 mmHg] 14 mmHg  Examination: Constitutional: no acute distress lying in bed  Eyes: EOMI, pupils equal Ears, nose, mouth, and throat: trach, small thick secretions Cardiovascular: distant, ext warm, sternal vac in place Respiratory: triggering vent, placed him on PS to see how he does Gastrointestinal: soft, rectal tube in place but hypoactive BS Skin: No rashes, normal turgor Neurologic: able to move all 4 ext briskly to command, profoundly weak Psychiatric: RASS 0  SvO2 70% on P4 BMP, CBG stable and benign WBC/Hgb/Plt slightly up Afebrile, off abx Net -1100 cc yesterday KUB benign CXR personally reviewed: stable vascular congestion and loculations on R chest   Assessment & Plan:   Acute hypoxic and hypercapnic respiratory failure requiring mechanical ventilation following cardiac surgery, post tracheostomy, seems improved, biggest issues are deconditioning and continued volume overload Persistent small right sided loculated pleural effusions- doubt contributing to resp failure Recurrrent NSVT lidocaine being weaned, remains on lidocaine Cardiogenic shock s/p high risk CABG complicated by tamponade requiring open chest now post omental flap closure, vac on impella wean, eventual plans for muscle flap closure at date TBD Vasoplegia- from prolonged critical illness on midodrine; question of uncontrolled sepsis source, in for CT C/A/P Dysphagia- s/p GJ tube Cardiorenal syndrome- now on CRRT Prolonged QT- complicates PAD control Persistent agitation/anxiety/pain- weaned to PO Profound muscular deconditioning postop due to prolonged critical illness Afib PTA  - Okay for PS trials today, watch for arrhythmias  - AC resumption per TCTS and cardiology - Continue klonipin, cannot do seroquel due to prolonged QTc - Wean dilaudid drip as able - Continue midodrine to 30 tid - Inotrope, pressor, and  impella wean per TCTS and CHF teams - CRRT per nephrology and CHF teams - Antiarrhythmics per EP team - Wound care and eventual muscle flap per TCTS and plastics - f/u CT C/A/P for  abd pain and question of possible untreated source of shock  Daily Goals Checklist  Pain/Anxiety/Delirium protocol (if indicated): dilaudid gtt, weaning as able VAP protocol (if indicated): bundle in place. DVT prophylaxis:  Full dose asa Nutrition Status: TF GI prophylaxis: pantoprazole Glucose control: insulin gtt Mobility/therapy needs: bed rest  Code Status: full  Family Communication: per primary Disposition: ICU   Patient critically ill due to persistent shock and respiratory failure Interventions to address this today vent wean, pressor titration, impella titration, CT C/A/P Risk of deterioration without these interventions is high  I personally spent 35 minutes providing critical care not including any separately billable procedures  Erskine Emery MD [facility] 02/12/2020 9:44 AM Personal pager: #[phone] If unanswered, please page CCM On-call: [phone]

## 2020-02-12 NOTE — Procedures (Signed)
I was called to the bedside for patient with sustained monomorphic VT with HR in the 110s and hemodynamically unstable with lowering MAP. Patient was still awake and alert. Had been given 150 mg bolus of amiodarone. Impella performace was increased to p-7 from p-4 in preparation for sedation for cardioversion.   2 mg Versed/ 100 mg fentanyl administered pre cardioversion and tolerated.  Patient was cleared and 1 shock was delivered with 200J and patient reverted to sinus rhythm. Patient left in stable condition.   Billey Chang, MD Cardiology Fellow

## 2020-02-12 NOTE — Consult Note (Signed)
Consultation Note Date: 02/12/2020   Patient Name: Corey Palmer  DOB: 1970-09-21  MRN: [id number]  Age / Sex: 50 y.o., male  PCP: Sueanne Margarita, MD Referring Physician: [name], MD  Reason for Consultation: Establishing goals of care and Psychosocial/spiritual support  HPI/Patient Profile: 50 y.o. male  admitted on 93/09/6884 --complicated hospitalization.   12/2 Afib with RVR. Cardioverted in ED. AICD did not fire. Milrinone for low co ox.  12/7 ICD fired, Torsades- likely from low K of 2.8.  12/8 Cath- multivessel disease w/ low CO. Volume overloaded.  12/11 Underwent Impella 5.5 insertion for persistent symptoms of forward failure with low cardiac indices. 12/12 Back to the OR for displacement of Impella. Extubated.  12/15 CABG x4, with LIMA-LAD, SVG-PDA/PLV, SVG-OM 12/16 Emergently opened chest at bedside for tamponade, clot compressing right atrium  12/16 To OR for Exploration of chest due to bleeding  12/19 Started CRRT 12/23 Sternal Closure in OR 12/24 Worsening shock.  Chest reopened at bedside with improvement in hemodynamics.  No mediastinal hematoma. 12/25 A flutter > rapid a paced to sinus rhythm.  Remains on high-dose pressors, inotropes 12/28 Weaning vasopressor requirements.  For chest washout today.  Back in atrial fibrillation, refractory to increased amiodarone and attempted overdrive pacing.  DCCV to NSR. Tolerating fluid removal via CRRT. 1/3: Appears euvolemic.  Adjusting glycemic control.  Chest still open so not weaning 1/4, spiking fever, Vanco meropenem resumed after culture sent from blood and sputum. 1/5 awaiting return to the OR for wound VAC dressing assessment fever curve and white blood cell count improving after antibiotics resumed the day prior, did have chest closed, developed tamponade physiology shortly after had to go back for emergent reopening of chest,  return to intensive care and refractory shock 1/6: Wound VAC dressing replaced.  Still in shock.  Starting to wean sedation 1/7: Still on high-dose pressors 1/10  Trach->1/11 1/10 PEG->1/11  Patient remains critically ill secondary to persistent shock and respiratory failure.  High risk for further decompensation  Patient and family face treatment option decisions,  advanced directive decisions and the emotional struggle of facing human mortality and limitations of medical interventions to prolong life when a body fails to thrive.    Clinical Assessment and Goals of Care:   This NP Wadie Lessen reviewed medical records, received report from team, assessed the patient and then spoke to his wife/[name] Gettis by telephone to discuss the  regarding current medical situation and to offer emotional support    Concept of Palliative Care was introduced as specialized medical care for people and their families living with serious illness.  If focuses on providing relief from the symptoms and stress of a serious illness.  The goal is to improve quality of life for both the patient and the family.  Values and goals of care important to patient and family were attempted to be elicited.  Created space and opportunity for wife and son  to explore thoughts and feelings regarding current medical information.  WIfe verbalizes  an understanding of the seriousness of her husbands situation and continues to remain hopeful for improvement.  However she also verbalizes an understanding that things may not play out the way she hopes.  I shared with [name] my concern that even in spite of aggressive medical interventions the body fails to thrive and survive.  She knows how fragile her husband is at this time.  She requests frequent updates on her husband's condition from the attending physicians.   Son Walls visited today at bedside and I spoke with him regarding his father's current medical situation and above  concepts.  He understands and ultimately does not want his father to suffer.  Questions and concerns addressed.  Family encouraged to call with questions or concerns.     PMT will continue to support holistically.          No documented healthcare power of attorney or advanced directive noted in chart.  Patient's wife is main Media planner and advocate for Mr. Redd at this time.     SUMMARY OF RECOMMENDATIONS    Code Status/Advance Care Planning:  Full code   Palliative Prophylaxis:   Aspiration, Delirium Protocol, Eye Care, Frequent Pain Assessment and Oral Care  Additional Recommendations (Limitations, Scope, Preferences):  Full Scope Treatment  Psycho-social/Spiritual:   Desire for further Chaplaincy support:yes   Prognosis:   Unable to determine  Discharge Planning: To Be Determined      Primary Diagnoses: Present on Admission: . Atrial fibrillation with RVR ([place])   I have reviewed the medical record, interviewed the patient and family, and examined the patient. The following aspects are pertinent.  Past Medical History:  Diagnosis Date  . Anginal pain ([place])   . Anxiety   . Arthritis   . Automatic implantable cardioverter-defibrillator in situ 2007   [employer]   . Bipolar disorder ([place])   . CAD (coronary artery disease)    Cardiac cath (08/19/2000) - Nonobstructive, 40% stenosis of LAD,.   . Cancer [facility]) 2013   benign stomach cancer  . CHF (congestive heart failure) ([place])   . Chondrocalcinosis of right knee 06/11/2013  . Chronic systolic heart failure ([place])    2D echo (47/4259) - Systolic function severely reduced., LV EF 20-25%. Akinesis of apical and anteroseptal mycoardium.  last 2D echo (11/2008 ), LV EF 25-30%, diffuse hypokinesis, and grade 1 diastolic dysfunction.  . Depression   . Diabetes uncomplicated adult-type II    on insulin therapy, a1c 12.9 in 01/2011  . Dilated cardiomyopathy ([place])    status post ICD placement in 2008 c/b  tearing at the atrial junction resulting in tamponade and urgent thoracotomy  . Dyslipidemia   . Dysrhythmia   . GERD (gastroesophageal reflux disease)   . Gout, unspecified   . Hepatic steatosis 2011   seen on ultrasound.   . History of kidney stones   . Hx of echocardiogram    Echo (04/2013):  Mild LVH, EF 20-25%, mild LAE, mild to mod RVE with mild reduced RVSF.  Marland Kitchen Hypertension   . Obesity (BMI 30.0-34.9)   . Pacemaker   . Paroxysmal atrial fibrillation ([facility])    on chronic coumadin, goal INR 2-3. status post direct current  . Peptic ulcer   . Shortness of breath   . Sleep apnea    does not have CPAP   Social History   Socioeconomic History  . Marital status: Married    Spouse name: Not on file  . Number of children: 3  . Years  of education: college  . Highest education level: Not on file  Occupational History  . Occupation: bar tender  Tobacco Use  . Smoking status: Never Smoker  . Smokeless tobacco: Never Used  Vaping Use  . Vaping Use: Never used  Substance and Sexual Activity  . Alcohol use: No  . Drug use: No  . Sexual activity: Yes  Other Topics Concern  . Not on file  Social History Narrative   Lives in [place].   Studied sports medicine in college, got a BS - used to work with the [employer].         Social Determinants of Health   Financial Resource Strain: Not on file  Food Insecurity: Not on file  Transportation Needs: Not on file  Physical Activity: Not on file  Stress: Not on file  Social Connections: Not on file   Family History  Problem Relation Age of Onset  . Diabetes Mother   . Coronary artery disease Mother 53  . Gout Mother   . Heart disease Mother   . Heart attack Mother 70  . Diabetes Father   . Coronary artery disease Father 67       Died in a house fire   . Heart disease Father   . Heart disease Brother    Scheduled Meds: . vitamin C  500 mg Per Tube TID  . aspirin  324 mg Per Tube Daily  . atorvastatin  80 mg Per Tube  Daily  . busPIRone  10 mg Per Tube TID  . chlorhexidine gluconate (MEDLINE KIT)  15 mL Mouth Rinse BID  . Chlorhexidine Gluconate Cloth  6 each Topical Daily  . darbepoetin (ARANESP) injection - NON-DIALYSIS  100 mcg Subcutaneous Q Sat-1800  . docusate  100 mg Per Tube BID  . feeding supplement (PROSource TF)  45 mL Per Tube TID  . fiber  1 packet Per Tube BID  . FLUoxetine  40 mg Per Tube BID  . mouth rinse  15 mL Mouth Rinse 10 times per day  . melatonin  5 mg Per Tube Daily  . midodrine  30 mg Per Tube TID WC  . multivitamin  1 tablet Per Tube QHS  . oxyCODONE  10 mg Per Tube Q6H  . pantoprazole (PROTONIX) IV  40 mg Intravenous Q12H  . polyethylene glycol  17 g Per Tube Daily  . sodium chloride flush  10-40 mL Intracatheter Q12H  . thiamine  100 mg Per Tube Daily  . valproic acid  250 mg Per Tube BID   Continuous Infusions: .  prismasol BGK 4/2.5 500 mL/hr at 02/12/20 0017  .  prismasol BGK 4/2.5 200 mL/hr at 02/11/20 0026  . sodium chloride Stopped (01/29/20 0955)  . sodium chloride    . amiodarone 60 mg/hr (02/12/20 0700)  . electrolyte-A Stopped (02/03/20 8916)  . epinephrine Stopped (02/08/20 0651)  . feeding supplement (PIVOT 1.5 CAL) 70 mL/hr at 02/11/20 0800  . impella catheter heparin 50 unit/mL in dextrose 5%    . HYDROmorphone 4 mg/hr (02/12/20 0700)  . insulin 1.3 mL/hr at 02/12/20 0700  . lactated ringers 20 mL/hr at 02/12/20 0700  . lidocaine 1 mg/min (02/12/20 0700)  . milrinone 0.125 mcg/kg/min (02/12/20 0700)  . norepinephrine (LEVOPHED) Adult infusion 26 mcg/min (02/12/20 0700)  . prismasol BGK 4/2.5 1,500 mL/hr at 02/12/20 0504  . vasopressin 0.04 Units/min (02/12/20 0700)   PRN Meds:.sodium chloride, Place/Maintain arterial line **AND** sodium chloride, artificial tears, bisacodyl **OR** bisacodyl, docusate, heparin,  heparin, HYDROmorphone, lip balm, metoprolol tartrate, midazolam, polyethylene glycol, sodium chloride flush Medications Prior to Admission:   Prior to Admission medications   Medication Sig Start Date End Date Taking? Authorizing Provider  apixaban (ELIQUIS) 5 MG TABS tablet Take 1 tablet (5 mg total) by mouth 2 (two) times daily. 12/02/19  Yes Arta Silence, MD  aspirin 81 MG chewable tablet Chew 324 mg by mouth as needed (for chest pain).   Yes [provider]  carvedilol (COREG) 25 MG tablet Take 1 tablet (25 mg total) by mouth 2 (two) times daily. Needs appt Patient taking differently: Take 25 mg by mouth 2 (two) times daily.  09/10/19  Yes Larey Dresser, MD  dapagliflozin propanediol (FARXIGA) 10 MG TABS tablet Take 10 mg by mouth daily before breakfast. 02/22/19  Yes Larey Dresser, MD  dicyclomine (BENTYL) 10 MG capsule Take 10 mg by mouth 3 (three) times daily before meals.   Yes [provider]  digoxin (LANOXIN) 0.125 MG tablet Take 1 tablet (0.125 mg total) by mouth daily. 02/19/19 02/12/2020 Yes Larey Dresser, MD  divalproex (DEPAKOTE ER) 500 MG 24 hr tablet Take 1 tablet (500 mg total) by mouth at bedtime. 12/06/19 12/05/20 Yes Pucilowski, Olgierd A, MD  FLUoxetine (PROZAC) 20 MG capsule Take 4 capsules (80 mg total) by mouth daily. Patient taking differently: Take 40 mg by mouth 2 (two) times daily.  12/06/19 12/05/20 Yes Pucilowski, Olgierd A, MD  furosemide (LASIX) 20 MG tablet TAKE 2 TABLETS (40 MG TOTAL) BY MOUTH IN THE MORNING AND 1 TABLET (20 MG TOTAL) EVERY EVENING. Patient taking differently: TAKE 2 TABLETS (40 MG TOTAL) BY MOUTH IN THE MORNING AND 1 TABLET (20 MG TOTAL) EVERY EVENING 11/08/19  Yes Larey Dresser, MD  insulin aspart (NOVOLOG FLEXPEN) 100 UNIT/ML FlexPen Inject 5-50 Units into the skin See admin instructions. Inject 5-50 units into the skin three times a day before meals, PER SLIDING SCALE   Yes [provider]  magnesium oxide (MAG-OX) 400 MG tablet Take 800 mg by mouth daily.    Yes [provider]  omeprazole (PRILOSEC) 40 MG capsule Take 1 capsule (40 mg  total) by mouth daily. Patient taking differently: Take 40 mg by mouth 2 (two) times daily before a meal.  08/18/13  Yes Larey Dresser, MD  ondansetron (ZOFRAN) 4 MG tablet Take 4 mg by mouth every 8 (eight) hours as needed for nausea or vomiting.   Yes [provider]  POTASSIUM PO Take 4 tablets by mouth in the morning.   Yes [provider]  promethazine (PHENERGAN) 25 MG tablet Take 25 mg by mouth every 8 (eight) hours as needed for nausea or vomiting.  10/12/19  Yes [provider]  sacubitril-valsartan (ENTRESTO) 49-51 MG Take 1 tablet by mouth 2 (two) times daily. 02/19/19  Yes Larey Dresser, MD  Semaglutide,0.25 or 0.5MG/DOS, (OZEMPIC, 0.25 OR 0.5 MG/DOSE,) 2 MG/1.5ML SOPN Inject 0.5 mg into the skin once a week. Patient taking differently: Inject 0.5 mg into the skin every Sunday.  12/01/19  Yes Renato Shin, MD  spironolactone (ALDACTONE) 25 MG tablet Take 1 tablet (25 mg total) by mouth daily. Needs appt Patient taking differently: Take 25 mg by mouth daily.  06/22/19  Yes Larey Dresser, MD  busPIRone (BUSPAR) 10 MG tablet TAKE 1 TABLET BY MOUTH THREE TIMES A DAY 01/10/2020   Pucilowski, Olgierd A, MD  glucose blood (CONTOUR TEST) test strip 1 each by Other route  2 (two) times daily. And lancets 1/day 02/03/19   Renato Shin, MD  hydrOXYzine (VISTARIL) 50 MG capsule TAKE 1 CAPSULE BY MOUTH 3 TIMES DAILY AS NEEDED FOR ANXIETY. 01/11/20   Pucilowski, Olgierd A, MD  insulin glargine (LANTUS SOLOSTAR) 100 UNIT/ML Solostar Pen Inject 40 Units into the skin every morning. 12/01/19   Renato Shin, MD   Allergies  Allergen Reactions  . Orange Fruit Anaphylaxis, Hives and Other (See Comments)    Per Pt- Blisters around lips and Hives all over, also  . Penicillins Anaphylaxis    Did it involve swelling of the face/tongue/throat, SOB, or low BP? Yes Did it involve sudden or severe rash/hives, skin peeling, or any reaction on the inside of your mouth or nose? Yes Did  you need to seek medical attention at a hospital or doctor's office? No When did it last happen?childhood If all above answers are "NO", may proceed with cephalosporin use.  [name] [Empagliflozin] Itching  . [name] [Insulin Glargine] Nausea And Vomiting   Review of Systems  Unable to perform ROS: Acuity of condition    Physical Exam Constitutional:      Appearance: He is ill-appearing.     Interventions: He is intubated.  Cardiovascular:     Rate and Rhythm: Normal rate.  Pulmonary:     Effort: He is intubated.  Skin:    General: Skin is warm and dry.  Neurological:     Mental Status: He is lethargic.     Vital Signs: BP 108/77   Pulse 78   Temp 97.9 F (36.6 C) (Axillary)   Resp (!) 29   Ht '6\' 3"'  (1.905 m)   Wt 98.3 kg   SpO2 100%   BMI 27.09 kg/m  Pain Scale: CPOT POSS *See Group Information*: 2-Acceptable,Slightly drowsy, easily aroused Pain Score: 5    SpO2: SpO2: 100 % O2 Device:SpO2: 100 % O2 Flow Rate: .O2 Flow Rate (L/min): 2 L/min  IO: Intake/output summary:   Intake/Output Summary (Last 24 hours) at 02/12/2020 0752 Last data filed at 02/12/2020 0700 Gross per 24 hour  Intake 5552.36 ml  Output 6732 ml  Net -1179.64 ml    LBM: Last BM Date: 02/10/20 Baseline Weight: Weight: 122.9 kg Most recent weight: Weight: 98.3 kg     Palliative Assessment/Data:   Discussed with Dr Haroldine Laws and bedside RN  Time In: 0800 Time Out: 0910 Time Total: 70 minutes --added 30 minutes in the afternoon with son Greater than 50%  of this time was spent counseling and coordinating care related to the above assessment and plan.  Signed by: Wadie Lessen, NP   Please contact Palliative Medicine Team phone at [phone] for questions and concerns.  For individual provider: See Shea Evans

## 2020-02-12 NOTE — Progress Notes (Addendum)
Patient ID: Corey Palmer, male   DOB: 1970/03/17, 50 y.o.   MRN: [id number]     Advanced Heart Failure Rounding Note  PCP-Cardiologist: No primary care provider on file.   Subjective:    - 12/7 Torsades -ICD shock x1.  - 12/11 Impella 5.5 placed.  - 12/12 Impella repositioned in OR - 12/15 CABG x 4 with LIMA-LAD, SVG-PDA/PLV, SVG-OM - 12/16 DCCV afib.  Back to OR to re-open chest and again to evacuate hematoma.  - 12/19 CVVH initiated  - 12/23 Chest closed - 12/24 Chest reopened - 12/26 RAP back into NSR - 12/27 back in atrial fibrillation, unable to rapid atrial pace out.   - 12/28 To OR, chest partially closed and wound vac placed.  DCCV to NSR.  - 12/31 Antibiotics stopped. Swan removed, HD catheter moved from right femoral to right IJ.  - 1/5 To OR for chest closure. Chest reopened emergently at bedside again to evacuate hematoma  - 1/10 To OR: omental flap closure of chest with wound vac, tracheostomy, G-tube, J-tube.    1/12 Plastics and EP consulted.  1/13 Palliative Care consulted.   Awake on vent. C/o abdominal pain  On milrinone 0.125 + NE stable at 24 + VP 0.04 + midodrine 15 tid. Also on amio drip. Lidocaine gtt started yesterday  Impella turned down to P-4. Co-ox stable at71%,. Good pulsativity in arterial waveform and Impella. CVP 10-11.  Remains on CVVH pulling 50-75 cc/hr currently.  Weight down another 2 pounds  WBC 16>>14>>17>>15>>14.4   Off abx. AF    Impella 5.5 P-4 Flow 2.1 Waveforms ok. No alarms. Good pulsativity LDH 332 => 381=> 396=>408 => 392=>383=>410 =>412 => 437   Objective:   Weight Range: 98.3 kg Body mass index is 27.09 kg/m.   Vital Signs:   Temp:  [97.9 F (36.6 C)-99.1 F (37.3 C)] 97.9 F (36.6 C) (01/15 0400) Pulse Rate:  [74-97] 88 (01/15 0802) Resp:  [28-36] 36 (01/15 0802) BP: (89-119)/(57-78) 119/57 (01/15 0802) SpO2:  [96 %-100 %] 96 % (01/15 0802) Arterial Line BP: (81-141)/(44-87) 121/58 (01/15 0800) FiO2 (%):  [40  %] 40 % (01/15 0802) Weight:  [98.3 kg] 98.3 kg (01/15 0500) Last BM Date: 02/10/20  Weight change: Filed Weights   02/10/20 0458 02/11/20 0500 02/12/20 0500  Weight: 100.3 kg 98.9 kg 98.3 kg    Intake/Output:   Intake/Output Summary (Last 24 hours) at 02/12/2020 0935 Last data filed at 02/12/2020 0800 Gross per 24 hour  Intake 5340.12 ml  Output 6256 ml  Net -915.88 ml      Physical Exam   CVP 10-11 General:  Awake on vent through trach. Follows commands HEENT: normal Neck: supple. +trialysis cath and trach. Carotids 2+ bilat; no bruits. No lymphadenopathy or thryomegaly appreciated. Cor: right chest Impella Chest open. Wound vac in place.Regular rate & rhythm. No rubs, gallops or murmurs. Lungs: clear Abdomen: soft, nontender, + distended. Tender to palpation around G-J tube No hepatosplenomegaly. No bruits or masses. Good bowel sounds. Extremities: no cyanosis, clubbing, rash, 2+ edema Neuro: alert & orientedx3, cranial nerves grossly intact. moves all 4 extremities w/o difficulty. Affect pleasant    Telemetry   NSR 80-90s with PVCs and couplets. No NSVT Personally reviewed  Labs    CBC Recent Labs    02/11/20 0315 02/12/20 0318  WBC 14.4* 16.6*  NEUTROABS 10.6* 12.1*  HGB 9.1* 9.5*  HCT 28.4* 29.3*  MCV 89.3 88.8  PLT 334 341*   Basic Metabolic Panel  Recent Labs    02/11/20 0315 02/11/20 1622 02/12/20 0318  NA 135 134* 133*  K 4.3 4.7 4.5  CL 101 101 100  CO2 _0 GLUCOSE 162* 213* 126*  BUN 30* 33* 35*  CREATININE 1.40* 1.47* 1.41*  CALCIUM 8.1* 7.9* 8.3*  MG 2.4  --  2.5*  PHOS 2.1* 2.3* 2.2*   Liver Function Tests Recent Labs    02/11/20 1622 02/12/20 0318  ALBUMIN 1.7* 1.8*   No results for input(s): LIPASE, AMYLASE in the last 72 hours. Cardiac Enzymes No results for input(s): CKTOTAL, CKMB, CKMBINDEX, TROPONINI in the last 72 hours.  BNP: BNP (last 3 results) Recent Labs    01/22/2020 1619  BNP 577.5*    ProBNP (last  3 results) No results for input(s): PROBNP in the last 8760 hours.   D-Dimer No results for input(s): DDIMER in the last 72 hours. Hemoglobin A1C No results for input(s): HGBA1C in the last 72 hours. Fasting Lipid Panel No results for input(s): CHOL, HDL, LDLCALC, TRIG, CHOLHDL, LDLDIRECT in the last 72 hours. Thyroid Function Tests No results for input(s): TSH, T4TOTAL, T3FREE, THYROIDAB in the last 72 hours.  Invalid input(s): FREET3  Other results:   Imaging    DG CHEST PORT 1 VIEW  Result Date: 02/12/2020 CLINICAL DATA:  Open heart surgery. EXAM: PORTABLE CHEST 1 VIEW COMPARISON:  02/11/2020 FINDINGS: Single lead left-sided pacemaker unchanged. Impella device unchanged. Right IJ central venous catheter has tip over the SVC. Right-sided PICC line unchanged with tip over the SVC. Bilateral chest tubes unchanged. Lungs are hypoinflated demonstrate mild hazy perihilar opacification likely mild vascular congestion without significant change. Persistent elliptical density over the periphery of the right upper lung suggesting loculated pleural fluid unchanged. No pneumothorax. Mild stable cardiomegaly. Remainder of the exam is unchanged. IMPRESSION: 1. Hypoinflation with stable cardiomegaly and suggestion of mild vascular congestion. Stable elliptical density over the periphery of the right upper lung likely loculated pleural fluid. 2. Tubes and lines as described. Electronically Signed   By: Marin Olp M.D.   On: 02/12/2020 09:03     Medications:     Scheduled Medications: . vitamin C  500 mg Per Tube TID  . aspirin  324 mg Per Tube Daily  . atorvastatin  80 mg Per Tube Daily  . busPIRone  10 mg Per Tube TID  . chlorhexidine gluconate (MEDLINE KIT)  15 mL Mouth Rinse BID  . Chlorhexidine Gluconate Cloth  6 each Topical Daily  . darbepoetin (ARANESP) injection - NON-DIALYSIS  100 mcg Subcutaneous Q Sat-1800  . docusate  100 mg Per Tube BID  . feeding supplement (PROSource TF)   45 mL Per Tube TID  . fiber  1 packet Per Tube BID  . FLUoxetine  40 mg Per Tube BID  . mouth rinse  15 mL Mouth Rinse 10 times per day  . melatonin  5 mg Per Tube Daily  . midodrine  30 mg Per Tube TID WC  . multivitamin  1 tablet Per Tube QHS  . oxyCODONE  10 mg Per Tube Q6H  . pantoprazole (PROTONIX) IV  40 mg Intravenous Q12H  . polyethylene glycol  17 g Per Tube Daily  . sodium chloride flush  10-40 mL Intracatheter Q12H  . thiamine  100 mg Per Tube Daily  . valproic acid  250 mg Per Tube BID    Infusions: .  prismasol BGK 4/2.5 500 mL/hr at 02/12/20 0832  .  prismasol BGK 4/2.5  200 mL/hr at 02/11/20 0026  . sodium chloride Stopped (01/29/20 0955)  . sodium chloride    . amiodarone 60 mg/hr (02/12/20 0934)  . electrolyte-A Stopped (02/03/20 0076)  . epinephrine Stopped (02/08/20 0651)  . feeding supplement (PIVOT 1.5 CAL) 1,000 mL (02/12/20 0756)  . impella catheter heparin 50 unit/mL in dextrose 5%    . HYDROmorphone 4 mg/hr (02/12/20 0800)  . insulin 1.3 mL/hr at 02/12/20 0800  . lactated ringers 20 mL/hr at 02/12/20 0800  . lidocaine 1 mg/min (02/12/20 0800)  . milrinone 0.125 mcg/kg/min (02/12/20 0914)  . norepinephrine (LEVOPHED) Adult infusion 26 mcg/min (02/12/20 0800)  . prismasol BGK 4/2.5 1,500 mL/hr at 02/12/20 0504  . vasopressin 0.04 Units/min (02/12/20 0916)    PRN Medications: sodium chloride, Place/Maintain arterial line **AND** sodium chloride, artificial tears, bisacodyl **OR** bisacodyl, docusate, heparin, heparin, HYDROmorphone, lip balm, metoprolol tartrate, midazolam, polyethylene glycol, sodium chloride flush     Assessment/Plan   1. Cardiogenic shock/acute on chronic systolic CHF: Initially nonischemic cardiomyopathy. Possible familial cardiomyopathy as both parents had cardiomyopathy and died at around 45.However, [employer] gene testing did not show any common mutation for cardiomyopathy.  However, this admission noted to have severe 3 vessel  disease so suspect component of ischemic cardiomyopathy. St Jude ICD. Echo in 8/20 with EF 15% and mildly decreased RV function. Echo this admission with EF < 20%, moderate LV dilation, RV mildly reduced, severe LAE, no significant MR.Low output HF with markedly low EF.  He has a long history of cardiomyopathy (20 yrs), tends to minimize symptoms.  Cardiorenal syndrome with creatinine up to 2.2,  stabilized on milrinone and Impella 5.5. SCr 1.44 day of CABG. CABG 12/15.  Post-op shock, back to OR 12/16 to open chest (chest wall compartment syndrome) and again to evacuate hematoma.  TEE post-op with severe RV dysfunction.  CVVHD initiated 12/19 for volume removal in setting of rising Scr/decreased UOP. Suspect ATN. S/p chest closure 12/23. Hemodynamics much worse on 12/24 with rising pressor demands. Co-ox 36%.  Bedside echo severe biventricular dysfunction with marked septal bounce. Chest re-opened 12/24.  Was atrial paced back to NSR on 12/26 with improvement in hemodynamics but back in atrial fibrillation on 12/27 and unable to pace out, DCCV to NSR on 12/28. Went back to OR 1/5 for chest closure but several hours later required emergent re-opening of chest at bedside to evacuate hematoma. To OR again 1/10 with closure of chest with omental flap. Remains on milrinone 0.125 + VP 0.04 + Norepi 24. Impella now down to P-4 with co-ox 71% and gool pulsativity. Would not wean further until ready to pull. Weight continue to drop with CVVHD. Appears close to euvolemic - Continue CVVH.  - Turn VP down to 0.03 - Continue to wean NE as tolerated (Goal MAP 70-90) - Continue milrinone at 0.125.  - I think we can likely remove Impella soon though I have some reservations about pulling it at same time as definitive chest closure. Will d/w TCTS 2. Atrial fibrillation: H/o PAF.  He was on dofetilide in the remote past but this was stopped due to noncompliance. He had an upper GI bleed from antral ulcers in 3/12. He was  seen by GI and was deemed safe to restart anticoagulation as long as he remains on a PPI.  No apparent recurrence of AF until just prior to this admission, was cardioverted back to NSR in ER but went back into atrial fibrillation.  Post-op afib, DCCV to NSR/BiV pacing am  12/16 -> back to AF. Rapid atrial pacing back to SR on 12/26 -> back in AF on 12/27 and unable to pace out. 12/28 DCCV to NSR. Remains in NSR  - In NSR with ongoing NSVT - Will decrease IV amiodarone to 30 mg/hr.   - Currently off systemic heparin with chest bleeding. Getting heparin through impella purge  3. CAD:   Cath this admission with severe 3 vessel disease. CABG x 4 on 12/15.  - Continue atorvastatin, ASA.  - continue current management 4. Acute on chronic hypoxemic respiratory failure: Vent per CCM.  Has tracheostomy.  - CCM following. D/w Dr. Tamala Julian at bedside 5. AKI on CKD stage 3: Likely ATN/cardiorenal. Anuric. On CVVH.    - Pulling 50 cc/hr via CVVH. Nearing euvolemia 6. Diabetes: Insulin.  7. Hyponatremia: Resolved with CVVH.  8. Torsades/ICD shock: 12/7/212 in hospital event, in setting of severe hypokalemia and hypomagnesemia.   9. Gout: h/o gout. Complained of rt knee pain c/w previous flares - treated w/ prednisone burst (completed).  10: ID: completed abx for PNA.  Afebrile.  Cultures NGTD.   11. FEN: On TFS s via J-tube. 12. Anemia: Stable 9/1 today.  Transfuse < 7.5.  13. Stage II Pressure Ulcer- sacrum -Continue to reposition every 2 hours R/L  14. Pleural Effusion  - loculated rt apical pleural effusion noted on imaging.  - continue UF through CVVH  - PCCM following  15. VT - Having runs NSVT. Can decrease amio to 30/hr - Lidocaine started yesterday per EP. Still with occasional NSVT. Will check level today. Likely switch to mexilitene tomorrow - keep K > 4.0 and Mg > 2.0 - wean pressors as tolerated  16. Ab pain - tender on exam. G-J tube flushed at bedside with Dr. [name] - will order CT  C/A/P  D/w Palliative Care at bedside - they will help [place] continue to keep family updated. Continue with aggressive support for now.     CRITICAL CARE Performed by: Glori Bickers  Total critical care time: 40 minutes  Critical care time was exclusive of separately billable procedures and treating other patients.  Critical care was necessary to treat or prevent imminent or life-threatening deterioration.  Critical care was time spent personally by me (independent of midlevel providers or residents) on the following activities: development of treatment plan with patient and/or surrogate as well as nursing, discussions with consultants, evaluation of patient's response to treatment, examination of patient, obtaining history from patient or surrogate, ordering and performing treatments and interventions, ordering and review of laboratory studies, ordering and review of radiographic studies, pulse oximetry and re-evaluation of patient's condition.    Glori Bickers, MD 02/12/2020 9:35 AM

## 2020-02-12 NOTE — Progress Notes (Signed)
[facility] Progress Note    Assessment/ Plan:    Corey Palmer with familial cardiomyopathy EF20% s/p redo CABG 01/03/2020 complicated by persistent shock, multiple attempts at sternal closure with formation of chest hematoma and tamponade, Afib with RVR, and AKI requiring CRRT.    #AKI/CKD stage III- presumably due to ischemic ATN in setting of cardiogenic shock s/p CABG.  CRRT started 01/16/20. All fluids 4K/2.5Ca.  Right femoral HD catheter placed 01/16/20 switched to RIJ on 01/28/20. Replete K prn - continue CRRT--> goal net neg 50-100cc/hr, can increase provided his hemodynamics can tolerate it. Persistently total body vol up  # CAD s/p CABG complicated by cardiogenic shock and post-op hematoma. secondary closure on 12/23 then had to be re opened.  Went to the OR for washout on 12/28, bedside washout 02/01/19.  OR 02/22/2020 for closure, then had emergent reopening at the bedside 02/17/2020 d/t anterior chest hematoma again. OR for chest closure on 1/10. -pec flap closure planned for mon. C/w UF to help with wound healing  # Cardiogenic shock- underlying familial cardiomyopathy s/p redo CABG, EF ~20%. Remains on pressors: levo and vaso. Also on milrinone. Impella weaned to P4. Continue mgmt per primary. Midodrine increased to 30 tid  # ABLA- s/p postoperative bleeding into chest cavity s/p re-exploration for tamponade/hematoma. Received multiple blood products  # Atrial fibrillation - s/p DCCV on 12/16.  On amiodarone.  Normal sinus rhythm currently  # VDRF- per primary svc. Trach/peg 1/10  #-Leukocytosis/ new fever: cultures redrawn 02/01/20, s/p vanc and meropenem  # R loculated pleural effusion: for CT chest on 1/6 with contrast which is necessary for clinical decision making.  #NSVT: ep on board, lido  Subjective:     No acute events. Seen on crrt. Pall care on board. Net neg 1.1L   Objective:   BP 122/74   Pulse 85   Temp 98.2 F (36.8 C)   Resp (!) 36   Ht 6' 3"  (1.905 m)   Wt 98.3 kg   SpO2 100%   BMI 27.09 kg/m   Intake/Output Summary (Last 24 hours) at 02/12/2020 1228 Last data filed at 02/12/2020 1200 Gross per 24 hour  Intake 5239.26 ml  Output 6780 ml  Net -1540.74 ml   Weight change: -0.6 kg  Physical Exam: Gen: nad CVS: reg rate CHEST: woundvac foam in place, R [place] Impella Resp: trach Abd: no distention Ext: anasarca Neuro: follows commands, alert ACCESS: R IJ nontunneled HD cath  Imaging: DG CHEST PORT 1 VIEW  Result Date: 02/12/2020 CLINICAL DATA:  Open heart surgery. EXAM: PORTABLE CHEST 1 VIEW COMPARISON:  02/11/2020 FINDINGS: Single lead left-sided pacemaker unchanged. Impella device unchanged. Right IJ central venous catheter has tip over the SVC. Right-sided PICC line unchanged with tip over the SVC. Bilateral chest tubes unchanged. Lungs are hypoinflated demonstrate mild hazy perihilar opacification likely mild vascular congestion without significant change. Persistent elliptical density over the periphery of the right upper lung suggesting loculated pleural fluid unchanged. No pneumothorax. Mild stable cardiomegaly. Remainder of the exam is unchanged. IMPRESSION: 1. Hypoinflation with stable cardiomegaly and suggestion of mild vascular congestion. Stable elliptical density over the periphery of the right upper lung likely loculated pleural fluid. 2. Tubes and lines as described. Electronically Signed   By: Daniel  Boyle M.D.   On: 02/12/2020 09:03   DG CHEST PORT 1 VIEW  Result Date: 02/11/2020 CLINICAL DATA:  Tracheostomy.  Chest tube. EXAM: PORTABLE CHEST 1 VIEW COMPARISON:  02/10/2020 FINDINGS: Tracheostomy remains in   good position. Bilateral chest tubes in place. No pneumothorax. Impella device in the left ventricle unchanged. Right jugular central venous catheter tip in the SVC unchanged. Right upper lobe opacity unchanged possible extrapleural hematoma. Diffuse bilateral airspace disease with vascular congestion likely due  to pulmonary edema. Bibasilar airspace disease unchanged. Small bilateral pleural effusions unchanged. IMPRESSION: No interval change in no new findings. Electronically Signed   By: Charles  Clark M.D.   On: 02/11/2020 08:39   DG Abd Portable 1V  Result Date: 02/11/2020 CLINICAL DATA:  Abdominal pain. EXAM: PORTABLE ABDOMEN - 1 VIEW COMPARISON:  02/03/2020 FINDINGS: Normal bowel gas pattern.  No dilated bowel loops identified. Skin staples in the midline. Surgical clips in the right upper quadrant. Possible surgical drain overlying the left abdomen. No acute skeletal abnormality. IMPRESSION: Postop laparotomy changes.  Negative for bowel obstruction or ileus. Electronically Signed   By: Charles  Clark M.D.   On: 02/11/2020 08:41    Labs: BMET Recent Labs  Lab 02/09/20 0430 02/09/20 1513 02/09/20 1519 02/10/20 0322 02/10/20 1600 02/11/20 0315 02/11/20 1622 02/12/20 0318  NA 135 137 135 133* 136 135 134* 133*  K 3.6 4.3 4.3 4.2 4.5 4.3 4.7 4.5  CL 104  --  103 101 103 101 101 100  CO2 22  --  24 23 25 25 24 23  GLUCOSE 238*  --  181* 191* 183* 162* 213* 126*  BUN 20  --  23* 28* 29* 30* 33* 35*  CREATININE 1.20  --  1.38* 1.51* 1.42* 1.40* 1.47* 1.41*  CALCIUM 6.8*  --  7.9* 7.9* 7.8* 8.1* 7.9* 8.3*  PHOS 2.7  --  2.5 2.6 2.0* 2.1* 2.3* 2.2*   CBC Recent Labs  Lab 02/09/20 0430 02/09/20 1513 02/10/20 0322 02/10/20 1600 02/11/20 0315 02/12/20 0318  WBC 14.5*   < > 15.4* 16.5* 14.4* 16.6*  NEUTROABS 10.9*  --  11.5*  --  10.6* 12.1*  HGB 7.8*   < > 9.1* 8.9* 9.1* 9.5*  HCT 23.5*   < > 28.9* 28.8* 28.4* 29.3*  MCV 88.7   < > 89.5 90.0 89.3 88.8  PLT 237   < > 292 322 334 445*   < > = values in this interval not displayed.    Medications:    . vitamin C  500 mg Per Tube TID  . aspirin  324 mg Per Tube Daily  . atorvastatin  80 mg Per Tube Daily  . busPIRone  10 mg Per Tube TID  . chlorhexidine gluconate (MEDLINE KIT)  15 mL Mouth Rinse BID  . Chlorhexidine Gluconate  Cloth  6 each Topical Daily  . darbepoetin (ARANESP) injection - NON-DIALYSIS  100 mcg Subcutaneous Q Sat-1800  . docusate  100 mg Per Tube BID  . feeding supplement (PROSource TF)  45 mL Per Tube TID  . fiber  1 packet Per Tube BID  . FLUoxetine  40 mg Per Tube BID  . mouth rinse  15 mL Mouth Rinse 10 times per day  . melatonin  5 mg Per Tube Daily  . midodrine  30 mg Per Tube TID WC  . multivitamin  1 tablet Per Tube QHS  . oxyCODONE  10 mg Per Tube Q6H  . pantoprazole (PROTONIX) IV  40 mg Intravenous Q12H  . polyethylene glycol  17 g Per Tube Daily  . sodium chloride flush  10-40 mL Intracatheter Q12H  . thiamine  100 mg Per Tube Daily  . valproic acid  250   mg Per Tube BID         02/12/2020, 12:28 PM  

## 2020-02-12 NOTE — Progress Notes (Signed)
[facility] Progress Note Patient Name: Corey Palmer DOB: January 01, 1971 MRN: [id number]   Date of Service  02/12/2020  HPI/Events of Note  Patient with agitation and nausea. Most recent EKG had a QTc of 0.60 msec.  eICU Interventions  Repeat EKG prior to prescribing anti-emetics, a one time dose of 0.5 mg of Ativan was given for anxiety.        Corey Palmer 02/12/2020, 3:30 AM

## 2020-02-12 NOTE — Progress Notes (Signed)
Patient transported to CT scan and back to 2H12 without incidence.

## 2020-02-12 NOTE — Progress Notes (Signed)
[facility] Progress Note Patient Name: Corey Palmer DOB: Jan 23, 1971 MRN: [id number]   Date of Service  02/12/2020  HPI/Events of Note  Pt asking for additional pain med, was defib at change of shift for VT, is maxed out on dilaudid 4mg , has received PRN's of dilaudid, pt still uncomfortable.  Camera; Now appear comfortable, just got versed. On dilaudid 4mg /hr for more than 3 days from 13 th.  VS stable. Trach to oxygen.  On AIBP- NICM.  3 chest tube, J tube.   As per RN. Ongoing pain-abdomen.   CT Chest abd: no active issues. Moderate effusion. ( no report on my epic. This is as per bed side RN).   Cr 1.2 On CRRT.   eICU Interventions  -fenta prn once if needed trial. On trach- to vent already.      Intervention Category Intermediate Interventions: Pain - evaluation and management  Elmer Sow 02/12/2020, 10:17 PM

## 2020-02-12 NOTE — Progress Notes (Signed)
[facility] for Heparin Indication: Impella 5.5  Allergies  Allergen Reactions  . Orange Fruit Anaphylaxis, Hives and Other (See Comments)    Per Pt- Blisters around lips and Hives all over, also  . Penicillins Anaphylaxis    Did it involve swelling of the face/tongue/throat, SOB, or low BP? Yes Did it involve sudden or severe rash/hives, skin peeling, or any reaction on the inside of your mouth or nose? Yes Did you need to seek medical attention at a hospital or doctor's office? No When did it last happen?childhood If all above answers are "NO", may proceed with cephalosporin use.  [name] [Empagliflozin] Itching  . Basaglar Kwikpen [Insulin Glargine] Nausea And Vomiting    Patient Measurements: Height: 6\' 3"  (190.5 cm) Weight: 98.3 kg (216 lb 11.4 oz) IBW/kg (Calculated) : 84.5 Heparin dosing wt: 101 kg  Vital Signs: Temp: 97.9 F (36.6 C) (01/15 0400) Temp Source: Axillary (01/15 0400) BP: 119/57 (01/15 0802) Pulse Rate: 88 (01/15 0802)  Labs: Recent Labs    02/10/20 0322 02/10/20 1600 02/11/20 0315 02/11/20 1622 02/12/20 0318  HGB 9.1* 8.9* 9.1*  --  9.5*  HCT 28.9* 28.8* 28.4*  --  29.3*  PLT 292 322 334  --  445*  HEPARINUNFRC <0.10*  --  <0.10*  --  <0.10*  CREATININE 1.51* 1.42* 1.40* 1.47* 1.41*    Estimated Creatinine Clearance: 74.1 mL/min (A) (by C-G formula based on SCr of 1.41 mg/dL (H)).   Assessment: 50 yo male s/p impella 5.5 placement. Pt started on heparinized purge and systemic heparin. Pt now s/p CABG 12/15 with Impella remaining in place c/b significant bleeding and hematoma requiring opened chest and reexploration in OR 12/16. Chest closed on 12/23, re-opened on 12/24. Underwent cardioversion in OR 12/28, s/p sternal wash out/application of wound VAC.   Underwent sternal washout, wound VAC change, followed by sternal closure on 1/5. Chest was re-opened on 1/5 given compromised hemodynamics.    Heparin remains in purge only. Heparin level undetectable as expected, CBC stable.   Goal of Therapy:  Monitor platelets by anticoagulation protocol: Yes   Plan:  Continue heparinized purge No systemic heparin for now F/u daily CBC, heparin level   [name], PharmD, BCPS, [facility] Clinical Pharmacist [phone] Please check AMION for all [facility] Pharmacy numbers 02/12/2020

## 2020-02-13 ENCOUNTER — Inpatient Hospital Stay (HOSPITAL_COMMUNITY): Payer: BC Managed Care – PPO

## 2020-02-13 DIAGNOSIS — J9601 Acute respiratory failure with hypoxia: Secondary | ICD-10-CM | POA: Diagnosis not present

## 2020-02-13 DIAGNOSIS — Z95811 Presence of heart assist device: Secondary | ICD-10-CM | POA: Diagnosis not present

## 2020-02-13 DIAGNOSIS — I472 Ventricular tachycardia: Secondary | ICD-10-CM | POA: Diagnosis not present

## 2020-02-13 DIAGNOSIS — R570 Cardiogenic shock: Secondary | ICD-10-CM | POA: Diagnosis not present

## 2020-02-13 LAB — GLUCOSE, CAPILLARY
Glucose-Capillary: 105 mg/dL — ABNORMAL HIGH (ref 70–99)
Glucose-Capillary: 107 mg/dL — ABNORMAL HIGH (ref 70–99)
Glucose-Capillary: 118 mg/dL — ABNORMAL HIGH (ref 70–99)
Glucose-Capillary: 155 mg/dL — ABNORMAL HIGH (ref 70–99)
Glucose-Capillary: 157 mg/dL — ABNORMAL HIGH (ref 70–99)
Glucose-Capillary: 161 mg/dL — ABNORMAL HIGH (ref 70–99)
Glucose-Capillary: 169 mg/dL — ABNORMAL HIGH (ref 70–99)
Glucose-Capillary: 177 mg/dL — ABNORMAL HIGH (ref 70–99)
Glucose-Capillary: 178 mg/dL — ABNORMAL HIGH (ref 70–99)
Glucose-Capillary: 186 mg/dL — ABNORMAL HIGH (ref 70–99)
Glucose-Capillary: 190 mg/dL — ABNORMAL HIGH (ref 70–99)
Glucose-Capillary: 196 mg/dL — ABNORMAL HIGH (ref 70–99)
Glucose-Capillary: 197 mg/dL — ABNORMAL HIGH (ref 70–99)
Glucose-Capillary: 199 mg/dL — ABNORMAL HIGH (ref 70–99)
Glucose-Capillary: 200 mg/dL — ABNORMAL HIGH (ref 70–99)
Glucose-Capillary: 202 mg/dL — ABNORMAL HIGH (ref 70–99)
Glucose-Capillary: 208 mg/dL — ABNORMAL HIGH (ref 70–99)
Glucose-Capillary: 57 mg/dL — ABNORMAL LOW (ref 70–99)
Glucose-Capillary: 60 mg/dL — ABNORMAL LOW (ref 70–99)

## 2020-02-13 LAB — COOXEMETRY PANEL
Carboxyhemoglobin: 1.5 % (ref 0.5–1.5)
Methemoglobin: 0.8 % (ref 0.0–1.5)
O2 Saturation: 57.2 %
Total hemoglobin: 8.9 g/dL — ABNORMAL LOW (ref 12.0–16.0)

## 2020-02-13 LAB — RENAL FUNCTION PANEL
Albumin: 1.7 g/dL — ABNORMAL LOW (ref 3.5–5.0)
Albumin: 1.8 g/dL — ABNORMAL LOW (ref 3.5–5.0)
Anion gap: 9 (ref 5–15)
Anion gap: 10 (ref 5–15)
BUN: 33 mg/dL — ABNORMAL HIGH (ref 6–20)
BUN: 34 mg/dL — ABNORMAL HIGH (ref 6–20)
CO2: 24 mmol/L (ref 22–32)
CO2: 22 mmol/L (ref 22–32)
Calcium: 8.3 mg/dL — ABNORMAL LOW (ref 8.9–10.3)
Calcium: 8 mg/dL — ABNORMAL LOW (ref 8.9–10.3)
Chloride: 101 mmol/L (ref 98–111)
Chloride: 100 mmol/L (ref 98–111)
Creatinine, Ser: 1.34 mg/dL — ABNORMAL HIGH (ref 0.61–1.24)
Creatinine, Ser: 1.33 mg/dL — ABNORMAL HIGH (ref 0.61–1.24)
GFR, Estimated: >60 mL/min (ref >60)
GFR, Estimated: >60 mL/min (ref >60)
Glucose, Bld: 214 mg/dL — ABNORMAL HIGH (ref 70–99)
Glucose, Bld: 206 mg/dL — ABNORMAL HIGH (ref 70–99)
Phosphorus: 1.9 mg/dL — ABNORMAL LOW (ref 2.5–4.6)
Phosphorus: 1.6 mg/dL — ABNORMAL LOW (ref 2.5–4.6)
Potassium: 4.5 mmol/L (ref 3.5–5.1)
Potassium: 4.3 mmol/L (ref 3.5–5.1)
Sodium: 134 mmol/L — ABNORMAL LOW (ref 135–145)
Sodium: 132 mmol/L — ABNORMAL LOW (ref 135–145)

## 2020-02-13 LAB — ECHOCARDIOGRAM LIMITED
Height: 75 in
Weight: 3456.81 oz

## 2020-02-13 LAB — CBC WITH DIFFERENTIAL/PLATELET
Abs Immature Granulocytes: 0.15 10*3/uL — ABNORMAL HIGH (ref 0.00–0.07)
Basophils Absolute: 0.1 10*3/uL (ref 0.0–0.1)
Basophils Relative: 1 %
Eosinophils Absolute: 0.7 10*3/uL — ABNORMAL HIGH (ref 0.0–0.5)
Eosinophils Relative: 5 %
HCT: 28.4 % — ABNORMAL LOW (ref 39.0–52.0)
Hemoglobin: 9.1 g/dL — ABNORMAL LOW (ref 13.0–17.0)
Immature Granulocytes: 1 %
Lymphocytes Relative: 6 %
Lymphs Abs: 0.9 10*3/uL (ref 0.7–4.0)
MCH: 28.3 pg (ref 26.0–34.0)
MCHC: 32 g/dL (ref 30.0–36.0)
MCV: 88.2 fL (ref 80.0–100.0)
Monocytes Absolute: 1.7 10*3/uL — ABNORMAL HIGH (ref 0.1–1.0)
Monocytes Relative: 12 %
Neutro Abs: 10.2 10*3/uL — ABNORMAL HIGH (ref 1.7–7.7)
Neutrophils Relative %: 75 %
Platelets: 446 10*3/uL — ABNORMAL HIGH (ref 150–400)
RBC: 3.22 MIL/uL — ABNORMAL LOW (ref 4.22–5.81)
RDW: 20.1 % — ABNORMAL HIGH (ref 11.5–15.5)
WBC: 13.7 10*3/uL — ABNORMAL HIGH (ref 4.0–10.5)
nRBC: 0.1 % (ref 0.0–0.2)

## 2020-02-13 LAB — HEPARIN LEVEL (UNFRACTIONATED): Heparin Unfractionated: <0.1 IU/mL — ABNORMAL LOW (ref 0.30–0.70)

## 2020-02-13 LAB — LIDOCAINE LEVEL: Lidocaine Lvl: 4.4 ug/mL (ref 1.5–5.0)

## 2020-02-13 LAB — MAGNESIUM: Magnesium: 2.3 mg/dL (ref 1.7–2.4)

## 2020-02-13 LAB — LACTATE DEHYDROGENASE: LDH: 403 U/L — ABNORMAL HIGH (ref 98–192)

## 2020-02-13 MED ORDER — DEXTROSE 50 % IV SOLN
INTRAVENOUS | Status: AC
  Administered 2020-02-13: 50 mL
  Filled 2020-02-13: qty 50

## 2020-02-13 MED ORDER — DEXMEDETOMIDINE HCL IN NACL 400 MCG/100ML IV SOLN
0.4000 ug/kg/h | INTRAVENOUS | Status: DC
  Administered 2020-02-13: 14:00:00 0.4 ug/kg/h via INTRAVENOUS
  Administered 2020-02-13: 0.8 ug/kg/h via INTRAVENOUS
  Administered 2020-02-13: 0.6 ug/kg/h via INTRAVENOUS
  Administered 2020-02-14: 0.4 ug/kg/h via INTRAVENOUS
  Administered 2020-02-14: 20:00:00 0.7 ug/kg/h via INTRAVENOUS
  Administered 2020-02-14: 0.4 ug/kg/h via INTRAVENOUS
  Administered 2020-02-14: 06:00:00 0.7 ug/kg/h via INTRAVENOUS
  Administered 2020-02-15: 10:00:00 1 ug/kg/h via INTRAVENOUS
  Administered 2020-02-15 (×2): 0.8 ug/kg/h via INTRAVENOUS
  Administered 2020-02-15: 14:00:00 1.2 ug/kg/h via INTRAVENOUS
  Administered 2020-02-15 – 2020-02-16 (×6): 1 ug/kg/h via INTRAVENOUS
  Administered 2020-02-16: 0.8 ug/kg/h via INTRAVENOUS
  Administered 2020-02-16 – 2020-02-19 (×13): 1 ug/kg/h via INTRAVENOUS
  Administered 2020-02-19: 17:00:00 0.8 ug/kg/h via INTRAVENOUS
  Administered 2020-02-19: 1 ug/kg/h via INTRAVENOUS
  Administered 2020-02-19: 04:00:00 1.2 ug/kg/h via INTRAVENOUS
  Administered 2020-02-20: 1 ug/kg/h via INTRAVENOUS
  Administered 2020-02-20: 18:00:00 1.2 ug/kg/h via INTRAVENOUS
  Administered 2020-02-20: 01:00:00 1 ug/kg/h via INTRAVENOUS
  Administered 2020-02-20: 09:00:00 0.6 ug/kg/h via INTRAVENOUS
  Administered 2020-02-20: 22:00:00 1.2 ug/kg/h via INTRAVENOUS
  Administered 2020-02-21: 16:00:00 1 ug/kg/h via INTRAVENOUS
  Administered 2020-02-21: 1.2 ug/kg/h via INTRAVENOUS
  Administered 2020-02-21: 20:00:00 1 ug/kg/h via INTRAVENOUS
  Administered 2020-02-21: 1.2 ug/kg/h via INTRAVENOUS
  Administered 2020-02-21: 12:00:00 0.9 ug/kg/h via INTRAVENOUS
  Administered 2020-02-21: 1.2 ug/kg/h via INTRAVENOUS
  Administered 2020-02-22 (×6): 1 ug/kg/h via INTRAVENOUS
  Administered 2020-02-23: 0.8 ug/kg/h via INTRAVENOUS
  Administered 2020-02-23 (×4): 1 ug/kg/h via INTRAVENOUS
  Administered 2020-02-24: 0.7 ug/kg/h via INTRAVENOUS
  Administered 2020-02-24: 0.6 ug/kg/h via INTRAVENOUS
  Administered 2020-02-24: 0.7 ug/kg/h via INTRAVENOUS
  Administered 2020-02-24: 0.8 ug/kg/h via INTRAVENOUS
  Administered 2020-02-24 – 2020-02-26 (×6): 0.7 ug/kg/h via INTRAVENOUS
  Administered 2020-02-26: 0.4 ug/kg/h via INTRAVENOUS
  Administered 2020-02-26 – 2020-02-27 (×4): 0.7 ug/kg/h via INTRAVENOUS
  Administered 2020-02-27: 0.702 ug/kg/h via INTRAVENOUS
  Administered 2020-02-28 (×4): 0.7 ug/kg/h via INTRAVENOUS
  Administered 2020-02-29 (×2): 0.8 ug/kg/h via INTRAVENOUS
  Administered 2020-02-29: 0.7 ug/kg/h via INTRAVENOUS
  Filled 2020-02-13 (×13): qty 100
  Filled 2020-02-13: qty 200
  Filled 2020-02-13 (×4): qty 100
  Filled 2020-02-13: qty 200
  Filled 2020-02-13 (×10): qty 100
  Filled 2020-02-13: qty 200
  Filled 2020-02-13 (×8): qty 100
  Filled 2020-02-13: qty 200
  Filled 2020-02-13 (×5): qty 100
  Filled 2020-02-13: qty 200
  Filled 2020-02-13 (×12): qty 100
  Filled 2020-02-13 (×2): qty 200
  Filled 2020-02-13 (×13): qty 100

## 2020-02-13 MED ORDER — CHLORHEXIDINE GLUCONATE CLOTH 2 % EX PADS: 6.0000 | MEDICATED_PAD | Freq: Once | CUTANEOUS | Status: AC

## 2020-02-13 NOTE — Progress Notes (Signed)
[place] for Heparin Indication: Impella 5.5  Allergies  Allergen Reactions  . Orange Fruit Anaphylaxis, Hives and Other (See Comments)    Per Pt- Blisters around lips and Hives all over, also  . Penicillins Anaphylaxis    Did it involve swelling of the face/tongue/throat, SOB, or low BP? Yes Did it involve sudden or severe rash/hives, skin peeling, or any reaction on the inside of your mouth or nose? Yes Did you need to seek medical attention at a hospital or doctor's office? No When did it last happen?childhood If all above answers are "NO", may proceed with cephalosporin use.  Corey Palmer [Empagliflozin] Itching  . Basaglar Kwikpen [Insulin Glargine] Nausea And Vomiting    Patient Measurements: Height: 6\' 3"  (190.5 cm) Weight: 98 kg (216 lb 0.8 oz) IBW/kg (Calculated) : 84.5 Heparin dosing wt: 101 kg  Vital Signs: Temp: 98.2 F (36.8 C) (01/16 0400) Temp Source: Axillary (01/16 0400) BP: 103/62 (01/16 0744) Pulse Rate: 102 (01/16 0744)  Labs: Recent Labs    02/11/20 0315 02/11/20 1622 02/12/20 0318 02/12/20 1604 02/13/20 0327  HGB 9.1*  --  9.5*  --  9.1*  HCT 28.4*  --  29.3*  --  28.4*  PLT 334  --  445*  --  446*  HEPARINUNFRC <0.10*  --  <0.10*  --  <0.10*  CREATININE 1.40*   < > 1.41* 1.29* 1.34*   < > = values in this interval not displayed.    Estimated Creatinine Clearance: 77.9 mL/min (A) (by C-G formula based on SCr of 1.34 mg/dL (H)).   Assessment: 50 yo male s/p impella 5.5 placement. Pt started on heparinized purge and systemic heparin. Pt now s/p CABG 12/15 with Impella remaining in place c/b significant bleeding and hematoma requiring opened chest and reexploration in OR 12/16. Chest closed on 12/23, re-opened on 12/24. Underwent cardioversion in OR 12/28, s/p sternal wash out/application of wound VAC.   Underwent sternal washout, wound VAC change, followed by sternal closure on 1/5. Chest was re-opened on  1/5 given compromised hemodynamics.   Heparin remains in purge only. Heparin level undetectable as expected, CBC stable.   Goal of Therapy:  Monitor platelets by anticoagulation protocol: Yes   Plan:  Continue heparinized purge No systemic heparin for now F/u daily CBC, heparin level   Corey Palmer, PharmD, BCPS, [facility] Clinical Pharmacist [phone] Please check AMION for all [facility] numbers 02/13/2020

## 2020-02-13 NOTE — Progress Notes (Addendum)
Patient ID: Corey Palmer, male   DOB: [birth year], 50 y.o.   MRN: [id number]     Advanced Heart Failure Rounding Note  PCP-Cardiologist: No primary care provider on file.   Subjective:    - 12/7 Torsades -ICD shock x1.  - 12/11 Impella 5.5 placed.  - 12/12 Impella repositioned in OR - 12/15 CABG x 4 with LIMA-LAD, SVG-PDA/PLV, SVG-OM - 12/16 DCCV afib.  Back to OR to re-open chest and again to evacuate hematoma.  - 12/19 CVVH initiated  - 12/23 Chest closed - 12/24 Chest reopened - 12/26 RAP back into NSR - 12/27 back in atrial fibrillation, unable to rapid atrial pace out.   - 12/28 To OR, chest partially closed and wound vac placed.  DCCV to NSR.  - 12/31 Antibiotics stopped. Swan removed, HD catheter moved from right femoral to right IJ.  - 1/5 To OR for chest closure. Chest reopened emergently at bedside again to evacuate hematoma  - 1/10 To OR: omental flap closure of chest with wound vac, tracheostomy, G-tube, J-tube.    - 1/12 Plastics and EP consulted.  - 1/13 Palliative Care consulted.  - 1/15 Went into VT, Shock 200 J and amiodarone bolus 150.      On milrinone 0.125 + NE 22 + VP 0.03 + midodrine 30 tid + 30 amio drip + Lidocaine 1 gtt.  Impella P-5. Co-ox pending. Good pulsativity in arterial waveform and Impella. CVP 11. Follows commands.   Remains on CVVH pulling 50-100 cc/hr.   Impella 5.5 P-5 Flow 2.9 LDH 332 => 381=> 396=>408 => 392=>383=>410 =>412 => 437=> 403   Objective:   Weight Range: 98.3 kg Body mass index is 27.09 kg/m.   Vital Signs:   Temp:  [97.2 F (36.2 C)-98.2 F (36.8 C)] 98.1 F (36.7 C) (01/15 2000) Pulse Rate:  [74-116] 85 (01/16 0300) Resp:  [18-36] 28 (01/15 2319) BP: (69-122)/(43-89) 108/76 (01/16 0300) SpO2:  [85 %-100 %] 100 % (01/16 0300) Arterial Line BP: (87-146)/(40-81) 123/58 (01/16 0300) FiO2 (%):  [40 %] 40 % (01/15 2319) Weight:  [98.3 kg] 98.3 kg (01/15 0500) Last BM Date: 02/10/20  Weight change: Filed Weights    02/10/20 0458 02/11/20 0500 02/12/20 0500  Weight: 100.3 kg 98.9 kg 98.3 kg    Intake/Output:   Intake/Output Summary (Last 24 hours) at 02/13/2020 0341 Last data filed at 02/13/2020 0300 Gross per 24 hour  Intake 4253.2 ml  Output 5252 ml  Net -998.8 ml      Physical Exam   CVP 11 General:  Chronically ill HEENT: Trach Neck: supple.   Cor: Regular rate & rhythm.  Lungs: Coarse  Abdomen: soft, nontender, mildly distended.  Extremities: no cyanosis, clubbing, rash, edema Neuro: alert, moves upper extremities w/o difficulty. Flat/sleeping pleasant   Telemetry   Episode of VT last PM.  Has been SR.  Rates 90s.  Personally reviewed.   Labs    CBC Recent Labs    02/12/20 0318 02/13/20 0327  WBC 16.6* 13.7*  NEUTROABS 12.1* 10.2*  HGB 9.5* 9.1*  HCT 29.3* 28.4*  MCV 88.8 88.2  PLT 445* 956*   Basic Metabolic Panel Recent Labs    02/11/20 0315 02/11/20 1622 02/12/20 0318 02/12/20 1604  NA 135   < > 133* 133*  K 4.3   < > 4.5 4.7  CL 101   < > 100 101  CO2 25   < > 23 23  GLUCOSE 162*   < > 126* 159*  BUN 30*   < > 35* 36*  CREATININE 1.40*   < > 1.41* 1.29*  CALCIUM 8.1*   < > 8.3* 7.9*  MG 2.4  --  2.5*  --   PHOS 2.1*   < > 2.2* 2.5   < > = values in this interval not displayed.   Liver Function Tests Recent Labs    02/12/20 0318 02/12/20 1604  ALBUMIN 1.8* 1.7*   No results for input(s): LIPASE, AMYLASE in the last 72 hours. Cardiac Enzymes No results for input(s): CKTOTAL, CKMB, CKMBINDEX, TROPONINI in the last 72 hours.  BNP: BNP (last 3 results) Recent Labs    01/28/2020 1619  BNP 577.5*    ProBNP (last 3 results) No results for input(s): PROBNP in the last 8760 hours.   D-Dimer No results for input(s): DDIMER in the last 72 hours. Hemoglobin A1C No results for input(s): HGBA1C in the last 72 hours. Fasting Lipid Panel No results for input(s): CHOL, HDL, LDLCALC, TRIG, CHOLHDL, LDLDIRECT in the last 72 hours. Thyroid Function  Tests No results for input(s): TSH, T4TOTAL, T3FREE, THYROIDAB in the last 72 hours.  Invalid input(s): FREET3  Other results:   Imaging    . DG CHEST PORT 1 VIEW .  Marland Kitchen Result Date: 02/12/2020 . CLINICAL DATA:  Open heart surgery. EXAM: PORTABLE CHEST 1 VIEW COMPARISON:  02/11/2020 FINDINGS: Single lead left-sided pacemaker unchanged. Impella device unchanged. Right IJ central venous catheter has tip over the SVC. Right-sided PICC line unchanged with tip over the SVC. Bilateral chest tubes unchanged. Lungs are hypoinflated demonstrate mild hazy perihilar opacification likely mild vascular congestion without significant change. Persistent elliptical density over the periphery of the right upper lung suggesting loculated pleural fluid unchanged. No pneumothorax. Mild stable cardiomegaly. Remainder of the exam is unchanged. IMPRESSION: 1. Hypoinflation with stable cardiomegaly and suggestion of mild vascular congestion. Stable elliptical density over the periphery of the right upper lung likely loculated pleural fluid. 2. Tubes and lines as described. Electronically Signed   By: Marin Olp M.D.   On: 02/12/2020 09:03  .  Marland Kitchen CT CHEST ABDOMEN PELVIS WO CONTRAST .  Marland Kitchen Result Date: 02/12/2020 . CLINICAL DATA:  Possible abscess or infection. EXAM: CT CHEST, ABDOMEN AND PELVIS WITHOUT CONTRAST TECHNIQUE: Multidetector CT imaging of the chest, abdomen and pelvis was performed following the standard protocol without IV contrast. COMPARISON:  October 14, 2011. FINDINGS: CT CHEST FINDINGS Cardiovascular: Mild cardiomegaly is noted. No evidence of thoracic aortic aneurysm. Left-sided pacemaker is unchanged in position. No pericardial effusion is noted. Impella device is noted with tip in the left ventricle. Mediastinum/Nodes: Tracheostomy tube appears to be in good position. Thyroid gland is unremarkable. Esophagus is unremarkable. No adenopathy is noted. Lungs/Pleura: No pneumothorax is noted moderate size  right pleural effusion is noted which may be loculated, with adjacent atelectasis or infiltrate involving the right upper and lower lobes. Left lower lobe airspace opacity is noted concerning for pneumonia or atelectasis. Two right-sided chest tubes are noted. One left-sided chest tube is noted. Musculoskeletal: Sternotomy defect is noted. No other bony abnormality is noted. CT ABDOMEN PELVIS FINDINGS Hepatobiliary: No focal liver abnormality is seen. Status post cholecystectomy. No biliary dilatation. Pancreas: Unremarkable. No pancreatic ductal dilatation or surrounding inflammatory changes. Spleen: Normal in size without focal abnormality. Adrenals/Urinary Tract: Adrenal glands are unremarkable. Kidneys are normal, without renal calculi, focal lesion, or hydronephrosis. Bladder is unremarkable. Stomach/Bowel: Gastrostomy tube appears to be in good position. Jejunostomy tube is also noted and  left lower quadrant. There is no evidence of bowel obstruction or inflammation. The appendix is unremarkable. Rectal tube is noted. Vascular/Lymphatic: Aortic atherosclerosis. No enlarged abdominal or pelvic lymph nodes. Reproductive: Prostate is unremarkable. Other: No abdominal wall hernia or abnormality. No abdominopelvic ascites. Musculoskeletal: No acute or significant osseous findings. IMPRESSION: 1. Moderate size right pleural effusion is noted which may be loculated, with adjacent atelectasis or infiltrate involving the right upper and lower lobes. 2. Left lower lobe airspace opacity is noted concerning for pneumonia or atelectasis. 3. Tracheostomy tube appears to be in good position. 4. Gastrostomy tube appears to be in good position. Jejunostomy tube is also noted in the left lower quadrant. 5. No other significant abnormality seen in the chest, abdomen or pelvis. 6. Aortic atherosclerosis. Aortic Atherosclerosis (ICD10-I70.0). Electronically Signed   By: Marijo Conception M.D.   On: 02/12/2020 14:45   .    Medications:     Scheduled Medications: . . . vitamin C .  500 mg . Per Tube . TID  . Marland Kitchen Marland Kitchen aspirin .  324 mg . Per Tube . Daily  . . . atorvastatin .  80 mg . Per Tube . Daily  . . . busPIRone .  10 mg . Per Tube . TID  . Marland Kitchen . chlorhexidine gluconate (MEDLINE KIT) .  15 mL . Mouth Rinse . BID  . Marland Kitchen . Chlorhexidine Gluconate Cloth .  6 each . Topical . Daily  . . . darbepoetin (ARANESP) injection - NON-DIALYSIS .  100 mcg . Subcutaneous . Q Sat-1800  . . . docusate .  100 mg . Per Tube . BID  . Marland Kitchen . feeding supplement (PROSource TF) .  45 mL . Per Tube . TID  . Marland Kitchen . fiber .  1 packet . Per Tube . BID  . . . FLUoxetine .  40 mg . Per Tube . BID  . Marland Kitchen . mouth rinse .  15 mL . Mouth Rinse . 10 times per day  . . . melatonin .  5 mg . Per Tube . Daily  . . . midodrine .  30 mg . Per Tube . TID WC  . Marland Kitchen . multivitamin .  1 tablet . Per Tube . QHS  . . . oxyCODONE .  10 mg . Per Tube . Q6H  . Marland Kitchen . pantoprazole (PROTONIX) IV .  40 mg . Intravenous . Q12H  . Marland Kitchen . polyethylene glycol .  17 g . Per Tube . Daily  . . . sodium chloride flush .  10-40 mL . Intracatheter . Q12H  . Marland Kitchen . thiamine .  100 mg . Per Tube . Daily  . . . valproic acid .  250 mg . Per Tube . BID  .   Infusions: . . Elmon Else BGK 4/2.5 . 500 mL/hr at 02/12/20 1835  . . Elmon Else BGK 4/2.5 . 200 mL/hr at 02/12/20 1836  . . . sodium chloride . Stopped (01/29/20 0955)  . . . sodium chloride .    . . Marland Kitchen amiodarone . 30 mg/hr (02/13/20 0300)  . . Jeananne Rama . Stopped (02/03/20 5009)  . . . epinephrine . Stopped (02/08/20 3818)  . . . feeding supplement (PIVOT 1.5 CAL) . 1,000 mL (02/12/20 0756)  . . . impella catheter heparin 50 unit/mL in dextrose 5% .    . . . HYDROmorphone . 4 mg/hr (02/13/20 0300)  . . . insulin .  5 mL/hr at 02/13/20 0300  . . . lactated ringers . 20 mL/hr at 02/13/20 0300  . . . lidocaine . 1 mg/min (02/13/20 0300)  . . . milrinone . 0.125 mcg/kg/min (02/13/20 0300)  . . . norepinephrine  (LEVOPHED) Adult infusion . 26 mcg/min (02/13/20 0300)  . . Elmon Else BGK 4/2.5 . 1,500 mL/hr at 02/12/20 2230  . . . vasopressin . 0.03 Units/min (02/13/20 0300)  .   PRN Medications: . sodium chloride, Place/Maintain arterial line **AND** sodium chloride, artificial tears, bisacodyl **OR** bisacodyl, docusate, fentaNYL (SUBLIMAZE) injection, heparin, heparin, HYDROmorphone, lip balm, metoprolol tartrate, midazolam, polyethylene glycol, sodium chloride flush     Assessment/Plan   1. Cardiogenic shock/acute on chronic systolic CHF: Initially nonischemic cardiomyopathy.  Possible familial cardiomyopathy as both parents had cardiomyopathy and died at around 70. However, [employer] gene testing did not show any common mutation for cardiomyopathy.  However, this admission noted to have severe 3 vessel disease so suspect component of ischemic cardiomyopathy.  St Jude ICD.  Echo in 8/20 with EF 15% and mildly decreased RV function. Echo this admission with EF < 20%, moderate LV dilation, RV mildly reduced, severe LAE, no significant MR. Low output HF with markedly low EF.  He has a long history of cardiomyopathy (20 yrs), tends to minimize symptoms.  Cardiorenal syndrome with creatinine up to 2.2,  stabilized on milrinone and Impella 5.5. SCr 1.44 day of CABG. CABG 12/15.  Post-op shock, back to OR 12/16 to open chest (chest wall compartment syndrome) and again to evacuate hematoma.  TEE post-op with severe RV dysfunction.  CVVHD initiated 12/19 for volume removal in setting of rising Scr/decreased UOP. Suspect ATN. S/p chest closure 12/23. Hemodynamics much worse on 12/24 with rising pressor demands. Co-ox 36%.  Bedside echo severe biventricular dysfunction with marked septal bounce. Chest re-opened 12/24.  Was atrial paced back to NSR on 12/26 with improvement in hemodynamics but back in atrial fibrillation on 12/27 and unable to pace out, DCCV to NSR on 12/28. Went back to OR 1/5 for chest closure but  several hours later required emergent re-opening of chest at bedside to evacuate hematoma. To OR again 1/10 with closure of chest with omental flap.  - Remains on milrinone 0.125 + VP 0.03 + Norepi 22. Impella P-5 with co-ox pending and gool pulsativity.  - Continue CVVH.  - Continue to wean NE as tolerated (Goal MAP 70-90) and milrinone at 0.125.  - Will d/w TCTS regarding concern for pulling impella with chest closure.  2. Atrial fibrillation: H/o PAF.  He was on dofetilide in the remote past but this was stopped due to noncompliance.  He had an upper GI bleed from antral ulcers in 3/12. He was seen by GI and was deemed safe to restart anticoagulation as long as he remains on a PPI.  No apparent recurrence of AF until just prior to this admission, was cardioverted back to NSR in ER but went back into atrial fibrillation.  Post-op afib, DCCV to NSR/BiV pacing am 12/16 -> back to AF. Rapid atrial pacing back to SR  - In NSR with ongoing NSVT - C/w amiodarone 30 mg/hr.   - Getting heparin through impella purge, no systemic heparin with chest bleeding.  3. CAD:   Cath this admission with severe 3 vessel disease. CABG x 4 on 12/15.  - Continue atorvastatin, ASA.  - continue current management 4. Acute on chronic hypoxemic respiratory failure: Vent per CCM.  Has tracheostomy.  -  CCM following. D/w Dr. Tamala Julian at bedside 5. AKI on CKD stage 3: Likely ATN/cardiorenal. Anuric. On CVVH.    - Pulling 50-100 cc/hr via CVVH. Nearing euvolemia 6. Diabetes: Insulin.  7. Hyponatremia: Resolved with CVVH.  8. Torsades/ICD shock: 12/7/212 in hospital event, in setting of severe hypokalemia and hypomagnesemia.   9. Gout: h/o gout. Complained of rt knee pain c/w previous flares - treated w/ prednisone burst (completed).  10: ID: completed abx for PNA.  Afebrile.  Cultures NGTD.   11. FEN: On TFS s via J-tube. 12. Anemia: Stable 9.1 today.  Transfuse < 7.5.  13. Stage II Pressure Ulcer- sacrum -Continue to  reposition every 2 hours R/L  14. Pleural Effusion  - loculated rt apical pleural effusion noted on imaging.  - continue UF through CVVH  - PCCM following  15. VT - Having runs NSVT. Yesterday required amio 150 bolus, followed by shock 200J.   - keep K > 4.0 and Mg > 2.0 - wean pressors as tolerated  16. Abd pain - G-J tube flushed at bedside  - CT C/A/P showed no source for the abdominal pain   Carlene Coria, NP 02/13/2020 3:41 AM   Agree with above.   Remains on Impella at P-5 this am. NE down to 18. VP at 0.03 and milrinone at 0.125. Co-ox 57% Continues to have runs of VT despite IV amio and lido (level still pending)   Remains on CVVD. Agitated on exam.   CT C/A/P unremarkable yesterday.   General:  On vent through trach. Agitated uncomfortable. Not following commands HEENT: normal Neck: supple. + trialysis cath + trach Cor: PMI nondisplaced. Chest open with wound vac. + tachy regular Lungs: coarse  Abdomen: soft. Diffuse mild tenderness. + GJ tube Extremities: no cyanosis, clubbing, rash, 1-2+ edema Neuro: as above  He is worse today. Impella was down to P-4 overnight but developed VT and had to be shocked Impella back up to P-5. Co-ox marginal despite NE 18 and milrinone 0.125. I am going to stop milrinone to see if this will help quiet down ectopy. Check echo to look at Impella placement. I have asked EP to see again today to help with VT - though I realize options are likely limited. Perhaps we can optimize ATP to start at lower rate (VT rate 100-110).   Prognosis remains quite guarded.   CRITICAL CARE Performed by: Glori Bickers  Total critical care time: 35 minutes  Critical care time was exclusive of separately billable procedures and treating other patients.  Critical care was necessary to treat or prevent imminent or life-threatening deterioration.  Critical care was time spent personally by me (independent of midlevel providers or residents) on the  following activities: development of treatment plan with patient and/or surrogate as well as nursing, discussions with consultants, evaluation of patient's response to treatment, examination of patient, obtaining history from patient or surrogate, ordering and performing treatments and interventions, ordering and review of laboratory studies, ordering and review of radiographic studies, pulse oximetry and re-evaluation of patient's condition.  Glori Bickers, MD  7:31 AM

## 2020-02-13 NOTE — Progress Notes (Signed)
  Echocardiogram 2D Echocardiogram has been performed.  Corey Palmer 02/13/2020, 8:24 AM

## 2020-02-13 NOTE — Progress Notes (Signed)
Progress Note  Patient Name: Corey Palmer Date of Encounter: 02/13/2020  [facility] Cardiologist: No primary care provider on file.   Subjective   Patient remains intubated and sedated. Went into slow VT with cycle length of 560 ms.  Inpatient Medications    Scheduled Meds: . vitamin C  500 mg Per Tube TID  . aspirin  324 mg Per Tube Daily  . atorvastatin  80 mg Per Tube Daily  . busPIRone  10 mg Per Tube TID  . chlorhexidine gluconate (MEDLINE KIT)  15 mL Mouth Rinse BID  . Chlorhexidine Gluconate Cloth  6 each Topical Daily  . darbepoetin (ARANESP) injection - NON-DIALYSIS  100 mcg Subcutaneous Q Sat-1800  . docusate  100 mg Per Tube BID  . feeding supplement (PROSource TF)  45 mL Per Tube TID  . fiber  1 packet Per Tube BID  . FLUoxetine  40 mg Per Tube BID  . mouth rinse  15 mL Mouth Rinse 10 times per day  . melatonin  5 mg Per Tube Daily  . midodrine  30 mg Per Tube TID WC  . multivitamin  1 tablet Per Tube QHS  . oxyCODONE  10 mg Per Tube Q6H  . pantoprazole (PROTONIX) IV  40 mg Intravenous Q12H  . polyethylene glycol  17 g Per Tube Daily  . sodium chloride flush  10-40 mL Intracatheter Q12H  . thiamine  100 mg Per Tube Daily  . valproic acid  250 mg Per Tube BID   Continuous Infusions: .  prismasol BGK 4/2.5 500 mL/hr at 02/13/20 0624  .  prismasol BGK 4/2.5 200 mL/hr at 02/12/20 1836  . sodium chloride Stopped (01/29/20 0955)  . sodium chloride    . amiodarone 30 mg/hr (02/13/20 0700)  . dexmedetomidine (PRECEDEX) IV infusion    . electrolyte-A Stopped (02/03/20 9371)  . epinephrine Stopped (02/08/20 0651)  . feeding supplement (PIVOT 1.5 CAL) 1,000 mL (02/12/20 0756)  . impella catheter heparin 50 unit/mL in dextrose 5%    . HYDROmorphone 4 mg/hr (02/13/20 0700)  . insulin 6.5 mL/hr at 02/13/20 0700  . lactated ringers 20 mL/hr at 02/13/20 0700  . lidocaine 1 mg/min (02/13/20 0700)  . norepinephrine (LEVOPHED) Adult infusion 18 mcg/min (02/13/20  0700)  . prismasol BGK 4/2.5 1,500 mL/hr at 02/13/20 0200  . vasopressin 0.03 Units/min (02/13/20 0700)   PRN Meds: sodium chloride, Place/Maintain arterial line **AND** sodium chloride, artificial tears, bisacodyl **OR** bisacodyl, docusate, heparin, heparin, HYDROmorphone, lip balm, metoprolol tartrate, midazolam, polyethylene glycol, sodium chloride flush   Vital Signs    Vitals:   02/13/20 0350 02/13/20 0400 02/13/20 0500 02/13/20 0744  BP: (!) 112/56 99/80  103/62  Pulse: 87 87  (!) 102  Resp: (!) 29   (!) 35  Temp:  98.2 F (36.8 C)    TempSrc:  Axillary    SpO2: 100% 100%  100%  Weight:   98 kg   Height:        Intake/Output Summary (Last 24 hours) at 02/13/2020 0824 Last data filed at 02/13/2020 0800 Gross per 24 hour  Intake 3921 ml  Output 5463 ml  Net -1542 ml   Last 3 Weights 02/13/2020 02/12/2020 02/11/2020  Weight (lbs) 216 lb 0.8 oz 216 lb 11.4 oz 218 lb 0.6 oz  Weight (kg) 98 kg 98.3 kg 98.9 kg  Some encounter information is confidential and restricted. Go to Review Flowsheets activity to see all data.      Telemetry  Sinus rhythm with episodes of ventricular tachycardia- Personally Reviewed  ECG    None new- Personally Reviewed  Physical Exam   GEN: No acute distress.   Neck: No JVD Cardiac: RRR, no murmurs, rubs, or gallops.  Respiratory: Clear to auscultation bilaterally. GI: Soft, nontender, non-distended  MS:  Open chest with wound VAC in place Neuro:  Nonfocal  Psych: Normal affect   Labs    High Sensitivity Troponin:  No results for input(s): TROPONINIHS in the last 720 hours.    Chemistry Recent Labs  Lab 02/12/20 0318 02/12/20 1604 02/13/20 0327  NA 133* 133* 134*  K 4.5 4.7 4.5  CL 100 101 101  CO2 '23 23 24  ' GLUCOSE 126* 159* 214*  BUN 35* 36* 33*  CREATININE 1.41* 1.29* 1.34*  CALCIUM 8.3* 7.9* 8.3*  ALBUMIN 1.8* 1.7* 1.7*  GFRNONAA >60 >60 >60  ANIONGAP '10 9 9     ' Hematology Recent Labs  Lab 02/11/20 0315  02/12/20 0318 02/13/20 0327  WBC 14.4* 16.6* 13.7*  RBC 3.18* 3.30* 3.22*  HGB 9.1* 9.5* 9.1*  HCT 28.4* 29.3* 28.4*  MCV 89.3 88.8 88.2  MCH 28.6 28.8 28.3  MCHC 32.0 32.4 32.0  RDW 20.0* 20.2* 20.1*  PLT 334 445* 446*    BNPNo results for input(s): BNP, PROBNP in the last 168 hours.   DDimer No results for input(s): DDIMER in the last 168 hours.   Radiology    DG CHEST PORT 1 VIEW  Result Date: 02/13/2020 CLINICAL DATA:  Open heart surgery. EXAM: PORTABLE CHEST 1 VIEW COMPARISON:  02/12/2020 FINDINGS: Patient is rotated to the left. Left-sided pacemaker unchanged. Tracheostomy tube in adequate position. Impella device unchanged. Right IJ central venous catheter and bilateral chest tubes unchanged. Catheter extending from below with tip over the region of the aortic arch/AP window unchanged. Lungs are somewhat hypoinflated and demonstrate persistent hazy prominence of the central pulmonary vessels likely mild vascular congestion. Stable pleural based density over the lateral right apex/upper lobe likely loculated pleural fluid. No significant effusion and no evidence of pneumothorax. Mild stable cardiomegaly. Remainder of the exam is unchanged. IMPRESSION: 1. Stable cardiomegaly with suggestion of persistent mild vascular congestion. 2. Tubes and lines as described. 3. Stable pleural based density over the lateral right apex/upper lobe likely loculated pleural fluid. Electronically Signed   By: Marin Olp M.D.   On: 02/13/2020 08:14   DG CHEST PORT 1 VIEW  Result Date: 02/12/2020 CLINICAL DATA:  Open heart surgery. EXAM: PORTABLE CHEST 1 VIEW COMPARISON:  02/11/2020 FINDINGS: Single lead left-sided pacemaker unchanged. Impella device unchanged. Right IJ central venous catheter has tip over the SVC. Right-sided PICC line unchanged with tip over the SVC. Bilateral chest tubes unchanged. Lungs are hypoinflated demonstrate mild hazy perihilar opacification likely mild vascular congestion  without significant change. Persistent elliptical density over the periphery of the right upper lung suggesting loculated pleural fluid unchanged. No pneumothorax. Mild stable cardiomegaly. Remainder of the exam is unchanged. IMPRESSION: 1. Hypoinflation with stable cardiomegaly and suggestion of mild vascular congestion. Stable elliptical density over the periphery of the right upper lung likely loculated pleural fluid. 2. Tubes and lines as described. Electronically Signed   By: Marin Olp M.D.   On: 02/12/2020 09:03   CT CHEST ABDOMEN PELVIS WO CONTRAST  Result Date: 02/12/2020 CLINICAL DATA:  Possible abscess or infection. EXAM: CT CHEST, ABDOMEN AND PELVIS WITHOUT CONTRAST TECHNIQUE: Multidetector CT imaging of the chest, abdomen and pelvis was performed following the standard protocol  without IV contrast. COMPARISON:  October 14, 2011. FINDINGS: CT CHEST FINDINGS Cardiovascular: Mild cardiomegaly is noted. No evidence of thoracic aortic aneurysm. Left-sided pacemaker is unchanged in position. No pericardial effusion is noted. Impella device is noted with tip in the left ventricle. Mediastinum/Nodes: Tracheostomy tube appears to be in good position. Thyroid gland is unremarkable. Esophagus is unremarkable. No adenopathy is noted. Lungs/Pleura: No pneumothorax is noted moderate size right pleural effusion is noted which may be loculated, with adjacent atelectasis or infiltrate involving the right upper and lower lobes. Left lower lobe airspace opacity is noted concerning for pneumonia or atelectasis. Two right-sided chest tubes are noted. One left-sided chest tube is noted. Musculoskeletal: Sternotomy defect is noted. No other bony abnormality is noted. CT ABDOMEN PELVIS FINDINGS Hepatobiliary: No focal liver abnormality is seen. Status post cholecystectomy. No biliary dilatation. Pancreas: Unremarkable. No pancreatic ductal dilatation or surrounding inflammatory changes. Spleen: Normal in size without  focal abnormality. Adrenals/Urinary Tract: Adrenal glands are unremarkable. Kidneys are normal, without renal calculi, focal lesion, or hydronephrosis. Bladder is unremarkable. Stomach/Bowel: Gastrostomy tube appears to be in good position. Jejunostomy tube is also noted and left lower quadrant. There is no evidence of bowel obstruction or inflammation. The appendix is unremarkable. Rectal tube is noted. Vascular/Lymphatic: Aortic atherosclerosis. No enlarged abdominal or pelvic lymph nodes. Reproductive: Prostate is unremarkable. Other: No abdominal wall hernia or abnormality. No abdominopelvic ascites. Musculoskeletal: No acute or significant osseous findings. IMPRESSION: 1. Moderate size right pleural effusion is noted which may be loculated, with adjacent atelectasis or infiltrate involving the right upper and lower lobes. 2. Left lower lobe airspace opacity is noted concerning for pneumonia or atelectasis. 3. Tracheostomy tube appears to be in good position. 4. Gastrostomy tube appears to be in good position. Jejunostomy tube is also noted in the left lower quadrant. 5. No other significant abnormality seen in the chest, abdomen or pelvis. 6. Aortic atherosclerosis. Aortic Atherosclerosis (ICD10-I70.0). Electronically Signed   By: Marijo Conception M.D.   On: 02/12/2020 14:45    Cardiac Studies   TTE 02/04/20 1. Left ventricular ejection fraction, by estimation, is 20%. The left  ventricle demonstrates global hypokinesis with septal dyskinesis. The left  ventricular internal cavity size was mildly dilated. Impella catheter  present, tip is at 4.6 cm in the left  ventricle. Position was not adjusted.  2. Right ventricular systolic function is moderately reduced. Normal  right ventricular size.  3. Limited echo for Impella position.   Patient Profile     50 y.o. male with a history of coronary artery disease status post CABG and multiple reoperations with increased VT burden  Assessment & Plan     VT Cardiogenic shock atrial fibrillation Coronary artery disease status post CABG Renal failure  Unfortunately he has gone back into VT. He is currently on both amiodarone and lidocaine. At this point, it would be difficult to adjust medications further. I have made changes to his ICD programming.  Max track rate decreased to 90 bpm. VT 1 zone decreased to 115 bpm with ATP x6. VT 2 decreased to 181 bpm with ATP and shocks.  We Najwa Spillane continue amiodarone and lidocaine. Lidocaine level pending.      For questions or updates, please contact [place] Please consult [email or web address] for contact info under        Signed, Gabriellia Rempel Meredith Leeds, MD  02/13/2020, 8:24 AM

## 2020-02-13 NOTE — Progress Notes (Signed)
[address]       [place] [address]             [phone]        CARDIOTHORACIC SURGERY PROGRESS NOTE   R6 Days Post-Op Procedure(s) (LRB): STERNAL WASHOUT WITH WOUND VAC CHANGE (N/A) GREATER OMENTAL FLAP CLOSURE OF STERNUM (N/A) OPEN TRACHEOSTOMY USING #8 SHILEY (N/A) OPEN INSERTION OF 16 FR. BOLUS GASTROSTOMY TUBE (N/A) 16 FR JEJUNOSTOMY INSERTION  (N/A) TRANSESOPHAGEAL ECHOCARDIOGRAM (TEE) (N/A)  Subjective: Follows commands.  Abdominal pain seems decreased.  Looks miserable.  Events overnight notable for recurrent pulsatile WCT felt likely c/w VT.  Seen already by Drs Haroldine Laws and Camnitz this morning.  Still on amiodarone and lidocaine.  Limited echo reviewed at bedside with Dr Haroldine Laws  Objective: Vital signs: BP Readings from Last 1 Encounters:  02/13/20 103/62   Pulse Readings from Last 1 Encounters:  02/13/20 (!) 102   Resp Readings from Last 1 Encounters:  02/13/20 (!) 35   Temp Readings from Last 1 Encounters:  02/13/20 98.2 F (36.8 C) (Axillary)    Hemodynamics: CVP:  [0 mmHg-20 mmHg] 11 mmHg  Mixed venous co-ox 57% Impella flows stable 2.3 L/min on P4, no alarms   Physical Exam:  Rhythm:   sinus  Breath sounds: symmetrical  Heart sounds:  RRR  Incisions:  Wound VAC intact, stable  Abdomen:  Soft, mildly-distended, mildly tender  Extremities:  Adequately perfused   Intake/Output from previous day: 01/15 0701 - 01/16 0700 In: 4117.3 [I.V.:2588.6; NG/GT:1120] Out: 5497 [Drains:375; Stool:350; Chest Tube:30] Intake/Output this shift: Total I/O In: -  Out: 218 [Other:218]  Lab Results:  CBC: Recent Labs    02/12/20 0318 02/13/20 0327  WBC 16.6* 13.7*  HGB 9.5* 9.1*  HCT 29.3* 28.4*  PLT 445* 446*    BMET:  Recent Labs    02/12/20 1604 02/13/20 0327  NA 133* 134*  K 4.7 4.5  CL 101 101  CO2 23 24  GLUCOSE 159* 214*  BUN 36* 33*  CREATININE 1.29* 1.34*  CALCIUM 7.9* 8.3*     PT/INR:  No results  for input(s): LABPROT, INR in the last 72 hours.  CBG (last 3)  Recent Labs    02/13/20 0317 02/13/20 0543 02/13/20 0706  GLUCAP 200* 208* 105*    ABG    Component Value Date/Time   PHART 7.417 02/09/2020 1513   PCO2ART 39.1 02/09/2020 1513   PO2ART 78 (L) 02/09/2020 1513   HCO3 25.2 02/09/2020 1513   TCO2 26 02/09/2020 1513   ACIDBASEDEF 1.0 02/08/2020 0101   O2SAT 57.2 02/13/2020 0539    CXR: PORTABLE CHEST 1 VIEW  COMPARISON:  02/12/2020  FINDINGS: Patient is rotated to the left. Left-sided pacemaker unchanged. Tracheostomy tube in adequate position. Impella device unchanged. Right IJ central venous catheter and bilateral chest tubes unchanged. Catheter extending from below with tip over the region of the aortic arch/AP window unchanged.  Lungs are somewhat hypoinflated and demonstrate persistent hazy prominence of the central pulmonary vessels likely mild vascular congestion. Stable pleural based density over the lateral right apex/upper lobe likely loculated pleural fluid. No significant effusion and no evidence of pneumothorax. Mild stable cardiomegaly. Remainder of the exam is unchanged.  IMPRESSION: 1. Stable cardiomegaly with suggestion of persistent mild vascular congestion. 2. Tubes and lines as described. 3. Stable pleural based density over the lateral right apex/upper lobe likely loculated pleural fluid.   Electronically Signed   By: Marin Olp M.D.  On: 02/13/2020 08:14    CT CHEST, ABDOMEN AND PELVIS WITHOUT CONTRAST  TECHNIQUE: Multidetector CT imaging of the chest, abdomen and pelvis was performed following the standard protocol without IV contrast.  COMPARISON:  October 14, 2011.  FINDINGS: CT CHEST FINDINGS  Cardiovascular: Mild cardiomegaly is noted. No evidence of thoracic aortic aneurysm. Left-sided pacemaker is unchanged in position. No pericardial effusion is noted. Impella device is noted with tip in the  left ventricle.  Mediastinum/Nodes: Tracheostomy tube appears to be in good position. Thyroid gland is unremarkable. Esophagus is unremarkable. No adenopathy is noted.  Lungs/Pleura: No pneumothorax is noted moderate size right pleural effusion is noted which may be loculated, with adjacent atelectasis or infiltrate involving the right upper and lower lobes. Left lower lobe airspace opacity is noted concerning for pneumonia or atelectasis. Two right-sided chest tubes are noted. One left-sided chest tube is noted.  Musculoskeletal: Sternotomy defect is noted. No other bony abnormality is noted.  CT ABDOMEN PELVIS FINDINGS  Hepatobiliary: No focal liver abnormality is seen. Status post cholecystectomy. No biliary dilatation.  Pancreas: Unremarkable. No pancreatic ductal dilatation or surrounding inflammatory changes.  Spleen: Normal in size without focal abnormality.  Adrenals/Urinary Tract: Adrenal glands are unremarkable. Kidneys are normal, without renal calculi, focal lesion, or hydronephrosis. Bladder is unremarkable.  Stomach/Bowel: Gastrostomy tube appears to be in good position. Jejunostomy tube is also noted and left lower quadrant. There is no evidence of bowel obstruction or inflammation. The appendix is unremarkable. Rectal tube is noted.  Vascular/Lymphatic: Aortic atherosclerosis. No enlarged abdominal or pelvic lymph nodes.  Reproductive: Prostate is unremarkable.  Other: No abdominal wall hernia or abnormality. No abdominopelvic ascites.  Musculoskeletal: No acute or significant osseous findings.  IMPRESSION: 1. Moderate size right pleural effusion is noted which may be loculated, with adjacent atelectasis or infiltrate involving the right upper and lower lobes. 2. Left lower lobe airspace opacity is noted concerning for pneumonia or atelectasis. 3. Tracheostomy tube appears to be in good position. 4. Gastrostomy tube appears to be in  good position. Jejunostomy tube is also noted in the left lower quadrant. 5. No other significant abnormality seen in the chest, abdomen or pelvis. 6. Aortic atherosclerosis.  Aortic Atherosclerosis (ICD10-I70.0).   Electronically Signed   By: Marijo Conception M.D.   On: 02/12/2020 14:45    Assessment/Plan: S/P Procedure(s) (LRB): STERNAL WASHOUT WITH WOUND VAC CHANGE (N/A) GREATER OMENTAL FLAP CLOSURE OF STERNUM (N/A) OPEN TRACHEOSTOMY USING #8 SHILEY (N/A) OPEN INSERTION OF 16 FR. BOLUS GASTROSTOMY TUBE (N/A) 16 FR JEJUNOSTOMY INSERTION  (N/A) TRANSESOPHAGEAL ECHOCARDIOGRAM (TEE) (N/A)   Clinically stable but still having intermittent episodes WCT c/w likely VT, still w/ Impella in place for biventricular failure now >30 days on high dose vasopressors for BP support, VDRF, ARF on CRRT, and open sternal wound having failed multiple attempts to close due to severe RV failure.  Options are limited as patient is not felt to be a candidate for long-term mechanical cardiac support or transplantation.  It might be reasonable to postpone any attempt at sternal wound closure as this might only lead to additional complications that could adversely affect eventual improvement from a cardiac standpoint.  I would favor considering optimizing conditions as much as possible with short term goals focused around weaning and removing Impella.  If this is not feasible in the reasonably near future then there is probably no chance for a meaningful recovery.   Rexene Alberts, MD 02/13/2020 9:04 AM

## 2020-02-13 NOTE — Progress Notes (Signed)
  Echo done at bedside   Impella cannula well positioned about 5cm from AoV.LVEF severely down ~15%. On P-4 LV not unloading well with septum bowing far to right. On P-8 LV unloads well and LV dimensions shrink considerably. RV mild to moderately HK.   ICD reprogrammed to provide ATP starting at 115 bpm which may still be faster than his slow VT rate. Few options left.   D/w Dr. Roxy Manns at bedside. Recommends considering letting sternal wound heal by secondary intention with wound vac in place and consider d/c to Impella to see if he will tolerate with chest open. Given VT and poor unloading of LV on P-4 I am concerned he may not have enough cardiac reserve to tolerate despite 40 points of pulse pressure on arterial line.    Will d/w Drs. Atkins and Mclean.   Additional 60mins CCT.   Glori Bickers, MD  8:52 AM

## 2020-02-13 NOTE — Progress Notes (Signed)
NAME:  Corey Palmer, MRN:  [id number], DOB:  Jan 05, 1971, LOS: 37 ADMISSION DATE:  01/04/2020, CONSULTATION DATE: 01/08/2020 REFERRING MD: Aundra Dubin, CHIEF COMPLAINT: Respiratory failure post Impella  HPI/course in hospital  50 year old man admitted with A. fib/RVR, torsades, cardiogenic shock [EF 15] requiring Impella, CABG x5, AKI requiring initiation of CRRT  Past Medical History:    has a past medical history of Anginal pain ([place]), Anxiety, Arthritis, Automatic implantable cardioverter-defibrillator in situ (2007), Bipolar disorder ([place]), CAD (coronary artery disease), Cancer ([place]) (2013), CHF (congestive heart failure) ([place]), Chondrocalcinosis of right knee (9/32/6712), Chronic systolic heart failure ([place]), Depression, Diabetes uncomplicated adult-type II, Dilated cardiomyopathy ([place]), Dyslipidemia, Dysrhythmia, GERD (gastroesophageal reflux disease), Gout, unspecified, Hepatic steatosis (2011), History of kidney stones, echocardiogram, Hypertension, Obesity (BMI 30.0-34.9), Pacemaker, Paroxysmal atrial fibrillation ([place]), Peptic ulcer, Shortness of breath, and Sleep apnea.  Significant Hospital Events:  12/2 Afib with RVR. Cardioverted in ED. AICD did not fire. Milrinone for low co ox.  12/7 ICD fired, Torsades- likely from low K of 2.8.  12/8 Cath- multivessel disease w/ low CO. Volume overloaded.  12/11 Underwent Impella 5.5 insertion for persistent symptoms of forward failure with low cardiac indices. 12/12 Back to the OR for displacement of Impella. Extubated.  12/15 CABG x4, with LIMA-LAD, SVG-PDA/PLV, SVG-OM 12/16 Emergently opened chest at bedside for tamponade, clot compressing right atrium  12/16 To OR for Exploration of chest due to bleeding  12/19 Started CRRT 12/23 Sternal Closure in OR 12/24 Worsening shock.  Chest reopened at bedside with improvement in hemodynamics.  No mediastinal hematoma. 12/25 A flutter > rapid a paced to sinus rhythm.  Remains on high-dose pressors,  inotropes 12/28 Weaning vasopressor requirements.  For chest washout today.  Back in atrial fibrillation, refractory to increased amiodarone and attempted overdrive pacing.  DCCV to NSR. Tolerating fluid removal via CRRT. 1/3: Appears euvolemic.  Adjusting glycemic control.  Chest still open so not weaning 1/4, spiking fever, Vanco meropenem resumed after culture sent from blood and sputum. 1/5 awaiting return to the OR for wound VAC dressing assessment fever curve and white blood cell count improving after antibiotics resumed the day prior, did have chest closed, developed tamponade physiology shortly after had to go back for emergent reopening of chest, return to intensive care and refractory shock 1/6: Wound VAC dressing replaced.  Still in shock.  Starting to wean sedation 1/7 >> : Still on high-dose pressors, CRRT 1/15 recurrent Vtach, shocked  Consults:  PCCM, nephrology, cardiology, cardiothoracic surgery, EP  Procedures:  12/2 R PICC >> 12/11 right ax impella 5.5 >> 12/15 ETT >> 12/15 right femoral arterial sheath >> 12/19 right femoral HD >> 12/23 chest tube-right pleural, left pleural , mediastinal  >> 1/10  Trach->1/11 1/10 PEG->1/11  Significant Diagnostic Tests:    Micro Data:  See micro tab  Antimicrobials:  See fever tab  Interval history:  Shocked overnight for recurrent Vtach Still some stomach pain this AM with incomplete relief on dilaudid gtt  Objective   Blood pressure 103/62, pulse (!) 102, temperature 98.2 F (36.8 C), temperature source Axillary, resp. rate (!) 35, height 6\' 3"  (1.905 m), weight 98 kg, SpO2 100 %. CVP:  [0 mmHg-20 mmHg] 11 mmHg  Vent Mode: PRVC FiO2 (%):  [40 %] 40 % Set Rate:  [28 bmp] 28 bmp Vt Set:  [510 mL] 510 mL PEEP:  [5 cmH20] 5 cmH20 Pressure Support:  [14 cmH20] 14 cmH20 Plateau Pressure:  [10 cmH20-25 cmH20] 20 cmH20   Intake/Output  Summary (Last 24 hours) at 02/13/2020 0843 Last data filed at 02/13/2020 0800 Gross per  24 hour  Intake 3921 ml  Output 5463 ml  Net -1542 ml   Filed Weights   02/11/20 0500 02/12/20 0500 02/13/20 0500  Weight: 98.9 kg 98.3 kg 98 kg   CVP:  [0 mmHg-20 mmHg] 11 mmHg  Examination: Constitutional: ill appearing man on vent  Eyes: EOMI, pupils equal Ears, nose, mouth, and throat: trach in place with small thick secretions, triggering vent Cardiovascular: regular, sternal vac in place, wide complex tach on monitor, ext lukewarm Respiratory: diminished bases, poor inspiratory effort Gastrointestinal: soft, hypoactive BS, rectal tube in place Skin: No rashes, normal turgor Neurologic: profoundly weak but moves all 4 ext Psychiatric: RASS 0   SvO2 57% (down from 22 yesterday) up to P5 on impella, underlying pulsatility still looks pretty good LDH stable Sugars labile CXR personally reviewed, unchanged mild edema and loculations along R chest CT C/A/P personally reviewed: small/moderate R loculated pleural effusion, bilateral mild airspace disease, suprisingly lung parenchyma looks pretty good  Assessment & Plan:   Acute hypoxic and hypercapnic respiratory failure requiring mechanical ventilation following cardiac surgery, post tracheostomy, seems improved, biggest issues are deconditioning and continued shock Persistent small right sided loculated pleural effusions- doubt contributing to resp failure, further management per TCTS Recurrrent Vtach- with intermittent shocks, has been on amio/lidocaine, cards/EP attempting to manage.  Cardiogenic shock s/p high risk CABG complicated by tamponade requiring open chest now post omental flap closure, vac on impella wean, eventual plans for muscle flap closure at date TBD Vasoplegia- from prolonged critical illness on midodrine Dysphagia- s/p GJ tube Cardiorenal syndrome- now on CRRT Prolonged QT- complicates PAD control Persistent agitation/anxiety/pain- weaned to PO Profound muscular deconditioning postop due to prolonged  critical illness Afib PTA  - Hold on further PS trials to reduce stress on heart given recurrent Vtach - AC resumption per TCTS and cardiology - Continue klonipin, cannot do seroquel due to prolonged QTc - Wean dilaudid drip as able - Continue midodrine to 30 tid - Inotrope, pressor, and impella wean per TCTS and CHF teams - CRRT per nephrology and CHF teams - Antiarrhythmics per EP team - Wound care and eventual muscle flap per TCTS and plastics - Guarded prognosis  Daily Goals Checklist  Pain/Anxiety/Delirium protocol (if indicated): dilaudid gtt, weaning as able VAP protocol (if indicated): bundle in place. DVT prophylaxis:  Full dose asa Nutrition Status: TF GI prophylaxis: pantoprazole Glucose control: insulin gtt Mobility/therapy needs: bed rest  Code Status: full  Family Communication: per primary Disposition: ICU   Patient critically ill due to persistent shock and respiratory failure Interventions to address this today vent wean, pressor titration Risk of deterioration without these interventions is high  I personally spent 32 minutes providing critical care not including any separately billable procedures  Erskine Emery MD [facility] 02/13/2020 8:43 AM Personal pager: [phone] If unanswered, please page CCM On-call: [phone]

## 2020-02-13 NOTE — Progress Notes (Signed)
      [address]       [employer] [id number]             [phone]                 6 Days Post-Op Procedure(s) (LRB): STERNAL WASHOUT WITH WOUND VAC CHANGE (N/A) GREATER OMENTAL FLAP CLOSURE OF STERNUM (N/A) OPEN TRACHEOSTOMY USING #8 SHILEY (N/A) OPEN INSERTION OF 16 FR. BOLUS GASTROSTOMY TUBE (N/A) 16 FR JEJUNOSTOMY INSERTION  (N/A) TRANSESOPHAGEAL ECHOCARDIOGRAM (TEE) (N/A)   Events: No issues or concerns per RN.   _______________________________________________________________ Vitals: BP (!) 126/56   Pulse 67   Temp 97.8 F (36.6 C) (Oral)   Resp (!) 29   Ht 6\' 3"  (1.905 m)   Wt 98 kg   SpO2 100%   BMI 27.00 kg/m   - Neuro: Alert and responds to verbal stimuli.  Precedex 0.8, dilaudid 4.   - Cardiovascular: HR regular, CVP 9.  On impella P4, power 2.0. No alarms.   Drips: VP 0.03, NE 22, Amio 30, Lido 1.   - Pulm: Coarse breath sounds, trach.  Vent Mode: PRVC FiO2 (%):  [40 %] 40 % Set Rate:  [28 bmp] 28 bmp Vt Set:  [510 mL] 510 mL PEEP:  [5 cmH20] 5 cmH20 Plateau Pressure:  [20 cmH20-26 cmH20] 26 cmH20  - Abd: Abd soft, non-tender with palpation. CRRT pulling 49mL/H.   - Extremity: dependent edema noted to posterior thighs.   .Intake/Output      01/16 0701 01/17 0700   I.V. (mL/kg) 1919 (19.6)   Other 229   NG/GT 980   Total Intake(mL/kg) 3128 (31.9)   Urine (mL/kg/hr) 0 (0)   Drains 300   Other 3084   Stool 100   Chest Tube 20   Total Output 3504   Net -376          _______________________________________________________________ Labs: CBC Latest Ref Rng & Units 02/13/2020 02/12/2020 02/11/2020  WBC 4.0 - 10.5 K/uL 13.7(H) 16.6(H) 14.4(H)  Hemoglobin 13.0 - 17.0 g/dL 9.1(L) 9.5(L) 9.1(L)  Hematocrit 39.0 - 52.0 % 28.4(L) 29.3(L) 28.4(L)  Platelets 150 - 400 K/uL 446(H) 445(H) 334   CMP Latest Ref Rng & Units 02/13/2020 02/13/2020 02/12/2020  Glucose 70 - 99 mg/dL 206(H) 214(H) 159(H)  BUN 6 - 20 mg/dL 34(H) 33(H) 36(H)   Creatinine 0.61 - 1.24 mg/dL 1.33(H) 1.34(H) 1.29(H)  Sodium 135 - 145 mmol/L 132(L) 134(L) 133(L)  Potassium 3.5 - 5.1 mmol/L 4.3 4.5 4.7  Chloride 98 - 111 mmol/L 100 101 101  CO2 22 - 32 mmol/L 22 24 23   Calcium 8.9 - 10.3 mg/dL 8.0(L) 8.3(L) 7.9(L)  Total Protein 6.5 - 8.1 g/dL - - -  Total Bilirubin 0.3 - 1.2 mg/dL - - -  Alkaline Phos 38 - 126 U/L - - -  AST 15 - 41 U/L - - -  ALT 0 - 44 U/L - - -    CXR: Improving vascular congestion with density RUL which is stable.   _______________________________________________________________  Assessment and Plan: s/p Impella (12/11), CABG x 4 (12/15), open chest (12/16)  Continue plan, no adjustments.    Corey Palmer 02/13/2020 9:25 PM

## 2020-02-13 NOTE — Progress Notes (Signed)
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    #AKI/CKD stage III- presumably due to ischemic ATN in setting of cardiogenic shock s/p CABG.  CRRT started 01/16/20. All fluids 4K/2.5Ca.  Right femoral HD catheter placed 01/16/20 switched to [place] on 01/28/20. Replete K prn - continue CRRT--> goal net neg 50-100cc/hr, can increase provided his hemodynamics can tolerate it. Persistently total body vol up - has been on CRRT nearly a month, at this point I believe there is a low likelihood that he will have any meaningful renal recovery  # CAD s/p CABG complicated by cardiogenic shock and post-op hematoma. secondary closure on 12/23 then had to be re opened.  Went to the OR for washout on 12/28, bedside washout 02/01/19.  OR 02/13/2020 for closure, then had emergent reopening at the bedside 02/17/2020 d/t anterior chest hematoma again. OR for chest closure on 1/10. -pec flap closure on hold, plan is to let him heal via wound vac and d/c impella  # Cardiogenic shock- underlying familial cardiomyopathy s/p redo CABG, EF ~20%. Remains on pressors: levo and vaso. Also on milrinone. Impella per team. Continue mgmt per primary. Midodrine 30 tid  # ABLA- s/p postoperative bleeding into chest cavity s/p re-exploration for tamponade/hematoma. Received multiple blood products  # Atrial fibrillation - s/p DCCV on 12/16.  On amiodarone.  Normal sinus rhythm currently  # VDRF- per primary svc. Trach/peg 1/10  #-Leukocytosis/ new fever: cultures redrawn 02/01/20, s/p vanc and meropenem  # R loculated pleural effusion: for CT chest on 1/6 with contrast which is necessary for clinical decision making.  #NSVT: ep on board, lido  Subjective:     No acute events. Seen on  crrt. Remains on pressors. Anuric Net neg 1.1L   Objective:   BP 103/62   Pulse (!) 102   Temp 98.2 F (36.8 C) (Axillary)   Resp (!) 35   Ht '6\' 3"'  (1.905 m)   Wt 98 kg   SpO2 100%   BMI 27.00 kg/m   Intake/Output Summary (Last 24 hours) at 02/13/2020 1030 Last data filed at 02/13/2020 1000 Gross per 24 hour  Intake 3378.45 ml  Output 5413 ml  Net -2034.55 ml   Weight change: -0.3 kg  Physical Exam: Gen: nad CVS: reg rate CHEST: woundvac foam in place, R [place] Impella Resp: trach Abd: soft Ext: anasarca Neuro: awake ACCESS: R IJ nontunneled HD cath  Imaging: DG CHEST PORT 1 VIEW  Result Date: 02/13/2020 CLINICAL DATA:  Open heart surgery. EXAM: PORTABLE CHEST 1 VIEW COMPARISON:  02/12/2020 FINDINGS: Patient is rotated to the left. Left-sided pacemaker unchanged. Tracheostomy tube in adequate position. Impella device unchanged. Right IJ central venous catheter and bilateral chest tubes unchanged. Catheter extending from below with tip over the region of the aortic arch/AP window unchanged. Lungs are somewhat hypoinflated and demonstrate persistent hazy prominence of the central pulmonary vessels likely mild vascular congestion. Stable pleural based density over the lateral right apex/upper lobe likely loculated pleural fluid. No significant effusion and no evidence of pneumothorax. Mild stable cardiomegaly. Remainder of the exam is unchanged. IMPRESSION: 1. Stable cardiomegaly with suggestion of persistent mild vascular congestion. 2. Tubes and lines as described. 3. Stable pleural based density over the lateral right apex/upper lobe likely loculated pleural fluid. Electronically  Signed   By: Marin Olp M.D.   On: 02/13/2020 08:14   DG CHEST PORT 1 VIEW  Result Date: 02/12/2020 CLINICAL DATA:  Open heart surgery. EXAM: PORTABLE CHEST 1 VIEW COMPARISON:  02/11/2020 FINDINGS: Single lead left-sided pacemaker unchanged. Impella device unchanged. Right IJ central venous catheter has  tip over the SVC. Right-sided PICC line unchanged with tip over the SVC. Bilateral chest tubes unchanged. Lungs are hypoinflated demonstrate mild hazy perihilar opacification likely mild vascular congestion without significant change. Persistent elliptical density over the periphery of the right upper lung suggesting loculated pleural fluid unchanged. No pneumothorax. Mild stable cardiomegaly. Remainder of the exam is unchanged. IMPRESSION: 1. Hypoinflation with stable cardiomegaly and suggestion of mild vascular congestion. Stable elliptical density over the periphery of the right upper lung likely loculated pleural fluid. 2. Tubes and lines as described. Electronically Signed   By: Marin Olp M.D.   On: 02/12/2020 09:03   ECHOCARDIOGRAM LIMITED  Result Date: 02/13/2020    ECHOCARDIOGRAM LIMITED REPORT   [name] Date of Exam: 02/13/2020 Medical Rec #:  [id number]     Height:       75.0 in Accession #:    [id number]    Weight:       216.0 lb Date of Birth:  [birth year]     BSA:          2.268 m Patient Age:    50 years      BP:           103/62 mmHg Patient Gender: M             HR:           97 bpm. Exam Location:  Inpatient Procedure: Limited Echo STAT ECHO Indications:    Impella position  History:        Patient has prior history of Echocardiogram examinations, most                 recent 02/04/2020. CHF, CAD, Prior CABG; Arrythmias:Atrial                 Fibrillation.  Sonographer:    Clayton Lefort RDCS (AE) Referring Phys: [id number] DANIEL R BENSIMHON IMPRESSIONS  1. Severe biventricular failure. No significant effusion. Pacing wire in RV. Markedly abnormal septal motion. Impellar device about 4.6 cm distal to arotic annulus with no entrapment. FINDINGS  Additional Comments: Severe biventricular failure. No significant effusion. Pacing wire in RV. Markedly abnormal septal motion. Impellar device about 4.6 cm distal to arotic annulus with no entrapment. Jenkins Rouge MD Electronically signed by  Jenkins Rouge MD Signature Date/Time: 02/13/2020/9:18:49 AM    Final    CT CHEST ABDOMEN PELVIS WO CONTRAST  Result Date: 02/12/2020 CLINICAL DATA:  Possible abscess or infection. EXAM: CT CHEST, ABDOMEN AND PELVIS WITHOUT CONTRAST TECHNIQUE: Multidetector CT imaging of the chest, abdomen and pelvis was performed following the standard protocol without IV contrast. COMPARISON:  October 14, 2011. FINDINGS: CT CHEST FINDINGS Cardiovascular: Mild cardiomegaly is noted. No evidence of thoracic aortic aneurysm. Left-sided pacemaker is unchanged in position. No pericardial effusion is noted. Impella device is noted with tip in the left ventricle. Mediastinum/Nodes: Tracheostomy tube appears to be in good position. Thyroid gland is unremarkable. Esophagus is unremarkable. No adenopathy is noted. Lungs/Pleura: No pneumothorax is noted moderate size right pleural effusion is noted which may be loculated, with adjacent atelectasis or infiltrate involving the right upper and lower lobes. Left lower lobe  airspace opacity is noted concerning for pneumonia or atelectasis. Two right-sided chest tubes are noted. One left-sided chest tube is noted. Musculoskeletal: Sternotomy defect is noted. No other bony abnormality is noted. CT ABDOMEN PELVIS FINDINGS Hepatobiliary: No focal liver abnormality is seen. Status post cholecystectomy. No biliary dilatation. Pancreas: Unremarkable. No pancreatic ductal dilatation or surrounding inflammatory changes. Spleen: Normal in size without focal abnormality. Adrenals/Urinary Tract: Adrenal glands are unremarkable. Kidneys are normal, without renal calculi, focal lesion, or hydronephrosis. Bladder is unremarkable. Stomach/Bowel: Gastrostomy tube appears to be in good position. Jejunostomy tube is also noted and left lower quadrant. There is no evidence of bowel obstruction or inflammation. The appendix is unremarkable. Rectal tube is noted. Vascular/Lymphatic: Aortic atherosclerosis. No  enlarged abdominal or pelvic lymph nodes. Reproductive: Prostate is unremarkable. Other: No abdominal wall hernia or abnormality. No abdominopelvic ascites. Musculoskeletal: No acute or significant osseous findings. IMPRESSION: 1. Moderate size right pleural effusion is noted which may be loculated, with adjacent atelectasis or infiltrate involving the right upper and lower lobes. 2. Left lower lobe airspace opacity is noted concerning for pneumonia or atelectasis. 3. Tracheostomy tube appears to be in good position. 4. Gastrostomy tube appears to be in good position. Jejunostomy tube is also noted in the left lower quadrant. 5. No other significant abnormality seen in the chest, abdomen or pelvis. 6. Aortic atherosclerosis. Aortic Atherosclerosis (ICD10-I70.0). Electronically Signed   By: Marijo Conception M.D.   On: 02/12/2020 14:45    Labs: BMET Recent Labs  Lab 02/10/20 0322 02/10/20 1600 02/11/20 0315 02/11/20 1622 02/12/20 0318 02/12/20 1604 02/13/20 0327  NA 133* 136 135 134* 133* 133* 134*  K 4.2 4.5 4.3 4.7 4.5 4.7 4.5  CL 101 103 101 101 100 101 101  CO2 '23 25 25 24 23 23 24  ' GLUCOSE 191* 183* 162* 213* 126* 159* 214*  BUN 28* 29* 30* 33* 35* 36* 33*  CREATININE 1.51* 1.42* 1.40* 1.47* 1.41* 1.29* 1.34*  CALCIUM 7.9* 7.8* 8.1* 7.9* 8.3* 7.9* 8.3*  PHOS 2.6 2.0* 2.1* 2.3* 2.2* 2.5 1.9*   CBC Recent Labs  Lab 02/10/20 0322 02/10/20 1600 02/11/20 0315 02/12/20 0318 02/13/20 0327  WBC 15.4* 16.5* 14.4* 16.6* 13.7*  NEUTROABS 11.5*  --  10.6* 12.1* 10.2*  HGB 9.1* 8.9* 9.1* 9.5* 9.1*  HCT 28.9* 28.8* 28.4* 29.3* 28.4*  MCV 89.5 90.0 89.3 88.8 88.2  PLT 292 322 334 445* 446*    Medications:    . vitamin C  500 mg Per Tube TID  . aspirin  324 mg Per Tube Daily  . atorvastatin  80 mg Per Tube Daily  . busPIRone  10 mg Per Tube TID  . chlorhexidine gluconate (MEDLINE KIT)  15 mL Mouth Rinse BID  . Chlorhexidine Gluconate Cloth  6 each Topical Daily  . darbepoetin  (ARANESP) injection - NON-DIALYSIS  100 mcg Subcutaneous Q Sat-1800  . docusate  100 mg Per Tube BID  . feeding supplement (PROSource TF)  45 mL Per Tube TID  . fiber  1 packet Per Tube BID  . FLUoxetine  40 mg Per Tube BID  . mouth rinse  15 mL Mouth Rinse 10 times per day  . melatonin  5 mg Per Tube Daily  . midodrine  30 mg Per Tube TID WC  . multivitamin  1 tablet Per Tube QHS  . oxyCODONE  10 mg Per Tube Q6H  . pantoprazole (PROTONIX) IV  40 mg Intravenous Q12H  . polyethylene glycol  17  g Per Tube Daily  . sodium chloride flush  10-40 mL Intracatheter Q12H  . thiamine  100 mg Per Tube Daily  . valproic acid  250 mg Per Tube BID      Gean Quint  02/13/2020, 10:30 AM

## 2020-02-14 ENCOUNTER — Encounter (HOSPITAL_COMMUNITY): Admission: EM | Disposition: E | Payer: Self-pay | Source: Home / Self Care | Attending: Cardiothoracic Surgery

## 2020-02-14 ENCOUNTER — Inpatient Hospital Stay (HOSPITAL_COMMUNITY): Payer: BC Managed Care – PPO

## 2020-02-14 ENCOUNTER — Inpatient Hospital Stay (HOSPITAL_COMMUNITY): Payer: BC Managed Care – PPO | Admitting: Anesthesiology

## 2020-02-14 ENCOUNTER — Encounter (HOSPITAL_COMMUNITY): Payer: Self-pay | Admitting: Cardiothoracic Surgery

## 2020-02-14 DIAGNOSIS — J9601 Acute respiratory failure with hypoxia: Secondary | ICD-10-CM | POA: Diagnosis not present

## 2020-02-14 DIAGNOSIS — R570 Cardiogenic shock: Secondary | ICD-10-CM | POA: Diagnosis not present

## 2020-02-14 DIAGNOSIS — S21109A Unspecified open wound of unspecified front wall of thorax without penetration into thoracic cavity, initial encounter: Secondary | ICD-10-CM | POA: Diagnosis not present

## 2020-02-14 HISTORY — PX: APPLICATION OF A-CELL OF EXTREMITY: SHX6303

## 2020-02-14 HISTORY — PX: APPLICATION OF WOUND VAC: SHX5189

## 2020-02-14 HISTORY — PX: INCISION AND DRAINAGE OF WOUND: SHX1803

## 2020-02-14 LAB — CBC WITH DIFFERENTIAL/PLATELET
Abs Immature Granulocytes: 0.25 10*3/uL — ABNORMAL HIGH (ref 0.00–0.07)
Basophils Absolute: 0.1 10*3/uL (ref 0.0–0.1)
Basophils Relative: 1 %
Eosinophils Absolute: 0.8 10*3/uL — ABNORMAL HIGH (ref 0.0–0.5)
Eosinophils Relative: 5 %
HCT: 29.6 % — ABNORMAL LOW (ref 39.0–52.0)
Hemoglobin: 9.1 g/dL — ABNORMAL LOW (ref 13.0–17.0)
Immature Granulocytes: 2 %
Lymphocytes Relative: 5 %
Lymphs Abs: 0.8 10*3/uL (ref 0.7–4.0)
MCH: 27.2 pg (ref 26.0–34.0)
MCHC: 30.7 g/dL (ref 30.0–36.0)
MCV: 88.6 fL (ref 80.0–100.0)
Monocytes Absolute: 1.6 10*3/uL — ABNORMAL HIGH (ref 0.1–1.0)
Monocytes Relative: 11 %
Neutro Abs: 11.9 10*3/uL — ABNORMAL HIGH (ref 1.7–7.7)
Neutrophils Relative %: 76 %
Platelets: 440 10*3/uL — ABNORMAL HIGH (ref 150–400)
RBC: 3.34 MIL/uL — ABNORMAL LOW (ref 4.22–5.81)
RDW: 20.4 % — ABNORMAL HIGH (ref 11.5–15.5)
WBC: 15.4 10*3/uL — ABNORMAL HIGH (ref 4.0–10.5)
nRBC: 0 % (ref 0.0–0.2)

## 2020-02-14 LAB — RENAL FUNCTION PANEL
Albumin: 1.7 g/dL — ABNORMAL LOW (ref 3.5–5.0)
Albumin: 1.7 g/dL — ABNORMAL LOW (ref 3.5–5.0)
Anion gap: 10 (ref 5–15)
Anion gap: 9 (ref 5–15)
BUN: 37 mg/dL — ABNORMAL HIGH (ref 6–20)
BUN: 31 mg/dL — ABNORMAL HIGH (ref 6–20)
CO2: 22 mmol/L (ref 22–32)
CO2: 24 mmol/L (ref 22–32)
Calcium: 8.2 mg/dL — ABNORMAL LOW (ref 8.9–10.3)
Calcium: 7.9 mg/dL — ABNORMAL LOW (ref 8.9–10.3)
Chloride: 101 mmol/L (ref 98–111)
Chloride: 102 mmol/L (ref 98–111)
Creatinine, Ser: 1.3 mg/dL — ABNORMAL HIGH (ref 0.61–1.24)
Creatinine, Ser: 1.35 mg/dL — ABNORMAL HIGH (ref 0.61–1.24)
GFR, Estimated: >60 mL/min (ref >60)
GFR, Estimated: >60 mL/min (ref >60)
Glucose, Bld: 121 mg/dL — ABNORMAL HIGH (ref 70–99)
Glucose, Bld: 127 mg/dL — ABNORMAL HIGH (ref 70–99)
Phosphorus: 2 mg/dL — ABNORMAL LOW (ref 2.5–4.6)
Phosphorus: 2.9 mg/dL (ref 2.5–4.6)
Potassium: 4.3 mmol/L (ref 3.5–5.1)
Potassium: 4.7 mmol/L (ref 3.5–5.1)
Sodium: 133 mmol/L — ABNORMAL LOW (ref 135–145)
Sodium: 135 mmol/L (ref 135–145)

## 2020-02-14 LAB — LIDOCAINE LEVEL: Lidocaine Lvl: 4.2 ug/mL (ref 1.5–5.0)

## 2020-02-14 LAB — GLUCOSE, CAPILLARY
Glucose-Capillary: 108 mg/dL — ABNORMAL HIGH (ref 70–99)
Glucose-Capillary: 111 mg/dL — ABNORMAL HIGH (ref 70–99)
Glucose-Capillary: 112 mg/dL — ABNORMAL HIGH (ref 70–99)
Glucose-Capillary: 114 mg/dL — ABNORMAL HIGH (ref 70–99)
Glucose-Capillary: 114 mg/dL — ABNORMAL HIGH (ref 70–99)
Glucose-Capillary: 117 mg/dL — ABNORMAL HIGH (ref 70–99)
Glucose-Capillary: 117 mg/dL — ABNORMAL HIGH (ref 70–99)
Glucose-Capillary: 119 mg/dL — ABNORMAL HIGH (ref 70–99)
Glucose-Capillary: 123 mg/dL — ABNORMAL HIGH (ref 70–99)
Glucose-Capillary: 124 mg/dL — ABNORMAL HIGH (ref 70–99)
Glucose-Capillary: 125 mg/dL — ABNORMAL HIGH (ref 70–99)
Glucose-Capillary: 128 mg/dL — ABNORMAL HIGH (ref 70–99)
Glucose-Capillary: 158 mg/dL — ABNORMAL HIGH (ref 70–99)
Glucose-Capillary: 159 mg/dL — ABNORMAL HIGH (ref 70–99)
Glucose-Capillary: 164 mg/dL — ABNORMAL HIGH (ref 70–99)
Glucose-Capillary: 188 mg/dL — ABNORMAL HIGH (ref 70–99)

## 2020-02-14 LAB — HEPARIN LEVEL (UNFRACTIONATED): Heparin Unfractionated: <0.1 IU/mL — ABNORMAL LOW (ref 0.30–0.70)

## 2020-02-14 LAB — LACTIC ACID, PLASMA
Lactic Acid, Venous: 1.7 mmol/L (ref 0.5–1.9)
Lactic Acid, Venous: 1.4 mmol/L (ref 0.5–1.9)

## 2020-02-14 LAB — COOXEMETRY PANEL
Carboxyhemoglobin: 1.8 % — ABNORMAL HIGH (ref 0.5–1.5)
Methemoglobin: 0.9 % (ref 0.0–1.5)
O2 Saturation: 71.6 %
Total hemoglobin: 8.1 g/dL — ABNORMAL LOW (ref 12.0–16.0)

## 2020-02-14 LAB — MAGNESIUM: Magnesium: 2.2 mg/dL (ref 1.7–2.4)

## 2020-02-14 LAB — LACTATE DEHYDROGENASE: LDH: 409 U/L — ABNORMAL HIGH (ref 98–192)

## 2020-02-14 SURGERY — IRRIGATION AND DEBRIDEMENT WOUND
Anesthesia: General | Site: Chest

## 2020-02-14 MED ORDER — HYDROMORPHONE HCL 2 MG/ML IJ SOLN
4.0000 mg | Freq: Once | INTRAMUSCULAR | Status: AC
  Administered 2020-02-14: 4 mg via INTRAVENOUS

## 2020-02-14 MED ORDER — POTASSIUM & SODIUM PHOSPHATES 280-160-250 MG PO PACK
1.0000 | PACK | Freq: Three times a day (TID) | ORAL | Status: AC
  Administered 2020-02-14 (×3): 1 via ORAL
  Filled 2020-02-14 (×3): qty 1

## 2020-02-14 MED ORDER — VASOPRESSIN 20 UNIT/ML IV SOLN
INTRAVENOUS | Status: DC | PRN
  Administered 2020-02-14 (×3): 1 [IU] via INTRAVENOUS
  Administered 2020-02-14 (×2): 2 [IU] via INTRAVENOUS

## 2020-02-14 MED ORDER — HYDROMORPHONE HCL 2 MG PO TABS
4.0000 mg | ORAL_TABLET | Freq: Four times a day (QID) | ORAL | Status: DC
  Administered 2020-02-14 – 2020-02-19 (×20): 4 mg via ORAL
  Filled 2020-02-14 (×20): qty 2

## 2020-02-14 MED ORDER — SODIUM CHLORIDE (PF) 0.9 % IJ SOLN
INTRAMUSCULAR | Status: AC
  Filled 2020-02-14: qty 20

## 2020-02-14 MED ORDER — ROCURONIUM BROMIDE 10 MG/ML (PF) SYRINGE
PREFILLED_SYRINGE | INTRAVENOUS | Status: DC | PRN
  Administered 2020-02-14: 50 mg via INTRAVENOUS
  Administered 2020-02-14: 20 mg via INTRAVENOUS

## 2020-02-14 MED ORDER — HEMOSTATIC AGENTS (NO CHARGE) OPTIME
TOPICAL | Status: DC | PRN
  Administered 2020-02-14 (×3): 1 via TOPICAL

## 2020-02-14 MED ORDER — VANCOMYCIN HCL 1500 MG/300ML IV SOLN
1500.0000 mg | INTRAVENOUS | Status: DC
  Administered 2020-02-14: 1500 mg via INTRAVENOUS
  Filled 2020-02-14: qty 300

## 2020-02-14 MED ORDER — SODIUM CHLORIDE 0.9 % IR SOLN
Status: DC | PRN
  Administered 2020-02-14: 1000 mL

## 2020-02-14 MED ORDER — LACTATED RINGERS IV SOLN: INTRAVENOUS | Status: DC | PRN

## 2020-02-14 MED ORDER — MIDODRINE HCL 5 MG PO TABS
20.0000 mg | ORAL_TABLET | Freq: Three times a day (TID) | ORAL | Status: DC
  Administered 2020-02-14 – 2020-03-07 (×67): 20 mg
  Filled 2020-02-14 (×67): qty 4

## 2020-02-14 MED ORDER — MIDAZOLAM HCL 2 MG/2ML IJ SOLN
2.0000 mg | Freq: Once | INTRAMUSCULAR | Status: AC
  Administered 2020-02-14: 2 mg via INTRAVENOUS
  Filled 2020-02-14: qty 2

## 2020-02-14 MED ORDER — VASOPRESSIN 20 UNIT/ML IV SOLN
INTRAVENOUS | Status: AC
  Filled 2020-02-14: qty 1

## 2020-02-14 MED ORDER — PROPOFOL 10 MG/ML IV BOLUS
INTRAVENOUS | Status: DC | PRN
  Administered 2020-02-14: 50 mg via INTRAVENOUS

## 2020-02-14 MED ORDER — HYDROMORPHONE HCL 2 MG/ML IJ SOLN: 4.0000 mg | Freq: Once | INTRAMUSCULAR | Status: DC

## 2020-02-14 MED ORDER — 0.9 % SODIUM CHLORIDE (POUR BTL) OPTIME
TOPICAL | Status: DC | PRN
  Administered 2020-02-14: 2000 mL

## 2020-02-14 MED ORDER — FENTANYL CITRATE (PF) 250 MCG/5ML IJ SOLN
INTRAMUSCULAR | Status: AC
  Filled 2020-02-14: qty 5

## 2020-02-14 MED ORDER — MEXILETINE HCL 150 MG PO CAPS
150.0000 mg | ORAL_CAPSULE | Freq: Three times a day (TID) | ORAL | Status: DC
Start: 2020-02-14 — End: 2020-02-19
  Administered 2020-02-14 – 2020-02-19 (×17): 150 mg via ORAL
  Filled 2020-02-14 (×19): qty 1

## 2020-02-14 SURGICAL SUPPLY — 119 items
AGENT HMST 10 BLLW SHRT CANN (HEMOSTASIS) ×3
ANCHOR CATH FOLEY SECURE (MISCELLANEOUS) ×9 IMPLANT
APL PRP STRL LF DISP 70% ISPRP (MISCELLANEOUS)
APL SKNCLS STERI-STRIP NONHPOA (GAUZE/BANDAGES/DRESSINGS)
APL SWBSTK 6 STRL LF DISP (MISCELLANEOUS)
APPLICATOR COTTON TIP 6 STRL (MISCELLANEOUS) IMPLANT
APPLICATOR COTTON TIP 6IN STRL (MISCELLANEOUS)
APPLIER CLIP 9.375 MED OPEN (MISCELLANEOUS)
APR CLP MED 9.3 20 MLT OPN (MISCELLANEOUS)
ATCH SMKEVC FLXB CAUT HNDSWH (FILTER) IMPLANT
BAG DECANTER FOR FLEXI CONT (MISCELLANEOUS) IMPLANT
BENZOIN TINCTURE PRP APPL 2/3 (GAUZE/BANDAGES/DRESSINGS) IMPLANT
BIOPATCH RED 1 DISK 7.0 (GAUZE/BANDAGES/DRESSINGS) IMPLANT
BIOPATCH RED 1IN DISK 7.0MM (GAUZE/BANDAGES/DRESSINGS)
BLADE SURG 10 STRL SS (BLADE) ×3 IMPLANT
BLADE SURG 15 STRL LF DISP TIS (BLADE) ×1 IMPLANT
BLADE SURG 15 STRL SS (BLADE) ×3
CANISTER SUCT 3000ML PPV (MISCELLANEOUS) ×3 IMPLANT
CANISTER WOUND CARE 500ML ATS (WOUND CARE) IMPLANT
CANISTER WOUNDNEG PRESSURE 500 (CANNISTER) ×3 IMPLANT
CHLORAPREP W/TINT 26 (MISCELLANEOUS) IMPLANT
CLIP APPLIE 9.375 MED OPEN (MISCELLANEOUS) IMPLANT
CLOSURE WOUND 1/2 X4 (GAUZE/BANDAGES/DRESSINGS)
CNTNR URN SCR LID CUP LEK RST (MISCELLANEOUS) ×1 IMPLANT
CONT SPEC 4OZ STRL OR WHT (MISCELLANEOUS) ×3
COVER SURGICAL LIGHT HANDLE (MISCELLANEOUS) ×3 IMPLANT
COVER WAND RF STERILE (DRAPES) IMPLANT
DRAIN CHANNEL 19F RND (DRAIN) IMPLANT
DRAIN JP 10F RND SILICONE (MISCELLANEOUS) IMPLANT
DRAPE HALF SHEET 40X57 (DRAPES) IMPLANT
DRAPE IMP U-DRAPE 54X76 (DRAPES) IMPLANT
DRAPE INCISE 23X17 IOBAN STRL (DRAPES)
DRAPE INCISE IOBAN 23X17 STRL (DRAPES) IMPLANT
DRAPE INCISE IOBAN 66X45 STRL (DRAPES) ×3 IMPLANT
DRAPE LAPAROSCOPIC ABDOMINAL (DRAPES) IMPLANT
DRAPE LAPAROTOMY 100X72 PEDS (DRAPES) IMPLANT
DRAPE ORTHO SPLIT 77X108 STRL (DRAPES)
DRAPE SURG 17X23 STRL (DRAPES) IMPLANT
DRAPE SURG ORHT 6 SPLT 77X108 (DRAPES) IMPLANT
DRAPE WARM FLUID 44X44 (DRAPES) ×3 IMPLANT
DRSG ADAPTIC 3X8 NADH LF (GAUZE/BANDAGES/DRESSINGS) IMPLANT
DRSG CUTIMED SORBACT 7X9 (GAUZE/BANDAGES/DRESSINGS) ×3 IMPLANT
DRSG HYDROCOLLOID 4X4 (GAUZE/BANDAGES/DRESSINGS) ×3 IMPLANT
DRSG PAD ABDOMINAL 8X10 ST (GAUZE/BANDAGES/DRESSINGS) ×3 IMPLANT
DRSG TEGADERM 4X4.5 CHG (GAUZE/BANDAGES/DRESSINGS) ×3 IMPLANT
DRSG VAC ATS LRG SENSATRAC (GAUZE/BANDAGES/DRESSINGS) ×3 IMPLANT
DRSG VAC ATS MED SENSATRAC (GAUZE/BANDAGES/DRESSINGS) IMPLANT
DRSG VAC ATS SM SENSATRAC (GAUZE/BANDAGES/DRESSINGS) IMPLANT
ELECT BLADE 4.0 EZ CLEAN MEGAD (MISCELLANEOUS) ×3
ELECT BLADE 6.5 EXT (BLADE) IMPLANT
ELECT CAUTERY BLADE 6.4 (BLADE) ×3 IMPLANT
ELECT REM PT RETURN 9FT ADLT (ELECTROSURGICAL) ×3
ELECT SOLID GEL RDN PRO-PADZ (MISCELLANEOUS) ×3
ELECTRODE BLDE 4.0 EZ CLN MEGD (MISCELLANEOUS) ×1 IMPLANT
ELECTRODE REM PT RTRN 9FT ADLT (ELECTROSURGICAL) ×1 IMPLANT
ELECTRODE SOLI GEL RDN PROPADZ (MISCELLANEOUS) ×1 IMPLANT
EVACUATOR SILICONE 100CC (DRAIN) IMPLANT
EVACUATOR SMOKE ACCUVAC VALLEY (FILTER)
GAUZE SPONGE 4X4 12PLY STRL (GAUZE/BANDAGES/DRESSINGS) IMPLANT
GAUZE SPONGE 4X4 12PLY STRL LF (GAUZE/BANDAGES/DRESSINGS) ×3 IMPLANT
GAUZE XEROFORM 5X9 LF (GAUZE/BANDAGES/DRESSINGS) IMPLANT
GEL ULTRASOUND 20GR AQUASONIC (MISCELLANEOUS) IMPLANT
GLOVE BIO SURGEON STRL SZ 6.5 (GLOVE) ×2 IMPLANT
GLOVE BIO SURGEONS STRL SZ 6.5 (GLOVE) ×1
GLOVE BIOGEL M 6.5 STRL (GLOVE) ×3 IMPLANT
GLOVE SURG NEOP MICRO LF SZ7.5 (GLOVE) ×6 IMPLANT
GLOVE SURG SS PI 6.5 STRL IVOR (GLOVE) ×3 IMPLANT
GOWN STRL REUS W/ TWL LRG LVL3 (GOWN DISPOSABLE) ×5 IMPLANT
GOWN STRL REUS W/TWL LRG LVL3 (GOWN DISPOSABLE) ×15
HANDPIECE INTERPULSE COAX TIP (DISPOSABLE) ×3
HEMOSTAT HEMOBLAST BELLOWS (HEMOSTASIS) ×9 IMPLANT
KIT BASIN OR (CUSTOM PROCEDURE TRAY) ×3 IMPLANT
KIT TURNOVER KIT B (KITS) ×3 IMPLANT
MARKER SKIN DUAL TIP RULER LAB (MISCELLANEOUS) IMPLANT
MATRIX WOUND 3-LAYER 10X15 (Tissue) ×2 IMPLANT
MICROMATRIX 1000MG (Tissue) ×6 IMPLANT
NEEDLE HYPO 25GX1X1/2 BEV (NEEDLE) IMPLANT
NS IRRIG 1000ML POUR BTL (IV SOLUTION) ×6 IMPLANT
PACK GENERAL/GYN (CUSTOM PROCEDURE TRAY) ×3 IMPLANT
PACK UNIVERSAL I (CUSTOM PROCEDURE TRAY) ×3 IMPLANT
PAD ARMBOARD 7.5X6 YLW CONV (MISCELLANEOUS) IMPLANT
PENCIL BUTTON HOLSTER BLD 10FT (ELECTRODE) ×3 IMPLANT
PREFILTER EVAC NS 1 1/3-3/8IN (MISCELLANEOUS) IMPLANT
SET ADHESIVE SKIN CLSR ABRA (MISCELLANEOUS) ×9 IMPLANT
SET HNDPC FAN SPRY TIP SCT (DISPOSABLE) ×1 IMPLANT
SOLUTION PARTIC MCRMTRX 1000MG (Tissue) ×2 IMPLANT
SPONGE LAP 18X18 RF (DISPOSABLE) ×6 IMPLANT
STAPLER VISISTAT 35W (STAPLE) IMPLANT
STOCKINETTE IMPERVIOUS 9X36 MD (GAUZE/BANDAGES/DRESSINGS) IMPLANT
STOCKINETTE IMPERVIOUS LG (DRAPES) IMPLANT
STRIP CLOSURE SKIN 1/2X4 (GAUZE/BANDAGES/DRESSINGS) IMPLANT
SURGILUBE 2OZ TUBE FLIPTOP (MISCELLANEOUS) IMPLANT
SUT MNCRL AB 3-0 PS2 18 (SUTURE) ×6 IMPLANT
SUT MNCRL AB 3-0 PS2 27 (SUTURE) IMPLANT
SUT MNCRL AB 4-0 PS2 18 (SUTURE) ×6 IMPLANT
SUT MON AB 2-0 CT1 36 (SUTURE) ×9 IMPLANT
SUT MON AB 5-0 PS2 18 (SUTURE) IMPLANT
SUT PDS AB 0 CT 36 (SUTURE) IMPLANT
SUT PDS AB 2-0 CT1 27 (SUTURE) ×15 IMPLANT
SUT PROLENE 3 0 PS 1 (SUTURE) IMPLANT
SUT SILK 4 0 P 3 (SUTURE) IMPLANT
SUT VIC AB 3-0 FS2 27 (SUTURE) IMPLANT
SUT VIC AB 3-0 SH 8-18 (SUTURE) IMPLANT
SUT VIC AB 5-0 PS2 18 (SUTURE) ×6 IMPLANT
SUT VICRYL 3 0 (SUTURE) IMPLANT
SWAB COLLECTION DEVICE MRSA (MISCELLANEOUS) IMPLANT
SWAB CULTURE ESWAB REG 1ML (MISCELLANEOUS) IMPLANT
SYR 50ML SLIP (SYRINGE) IMPLANT
SYR BULB IRRIG 60ML STRL (SYRINGE) ×3 IMPLANT
SYR CONTROL 10ML LL (SYRINGE) ×3 IMPLANT
TAPE CLOTH SURG 4X10 WHT LF (GAUZE/BANDAGES/DRESSINGS) ×3 IMPLANT
TOWEL GREEN STERILE (TOWEL DISPOSABLE) ×3 IMPLANT
TOWEL GREEN STERILE FF (TOWEL DISPOSABLE) ×3 IMPLANT
TRAY FOLEY MTR SLVR 14FR STAT (SET/KITS/TRAYS/PACK) IMPLANT
TUBE CONNECTING 12'X1/4 (SUCTIONS) ×1
TUBE CONNECTING 12X1/4 (SUCTIONS) ×2 IMPLANT
UNDERPAD 30X36 HEAVY ABSORB (UNDERPADS AND DIAPERS) IMPLANT
WOUND MATRIX 3-LAYER 10X15 (Tissue) ×1 IMPLANT
YANKAUER SUCT BULB TIP NO VENT (SUCTIONS) ×3 IMPLANT

## 2020-02-14 NOTE — Op Note (Signed)
DATE OF OPERATION: 02/23/2020  LOCATION: [facility] In[facility]  PREOPERATIVE DIAGNOSIS: Sternal chest wound  POSTOPERATIVE DIAGNOSIS: Same  PROCEDURE:  1. Partial closure of sternal chest wound 6 x 3 x 5 cm 2. Placement of Acell powder 2 gm and 10 x 15 cm sheet 3. Placement of VAC sponge 4. Placement of ABRA device  SURGEON: Mykeisha Dysert Sanger Gwendolin Briel, DO  ASSISTANT: Roetta Sessions, PA  EBL: 100 cc  CONDITION: Stable  COMPLICATIONS: None  INDICATION: The patient, Corey Palmer, is a 50 y.o. male born on [birth year], is here for treatment of a sternal wound after cardiac surgery with opening after the procedure.   PROCEDURE DETAILS:  The patient was taken to the operating room by the CT surgery team.  The IV antibiotics were given and general anesthetic administered. A standard time out was performed and all information was confirmed by those in the room. Dr. [name] performed the I and D and will be dictated separately.   The #10 blade was used to excise the skin edges.  Hemostasis was achieved with electrocautery and hemoblast x 3.  Undermining was performed for 6 cm in each lateral direction.  This was superficial to the pectoralis muscle.  A combination of the 2-0 Monocryl and PDS was used to close the deep layer for 5 cm of the inferior portion of the wound.  The superior portion of the wound was closed for 1.5 cm in the same fashion.  The 3-0 Monocryl was then closed with vertical mattress sutures and 4-0 Monocryl interrupted sutures. The open superior portion of the wound had all of the Acell powder and sheet applied and secured with the 5-0 Vicryl.  The sorbact was then placed and secured with the 5-0 Vicryl.  The VAC was then applied and an excellent seal obtained.  The ABRA straps were placed at the open area.   The patient was allowed to wake up and taken to recovery room in stable condition at the end of the case. The family was notified at the end of the case.   The  advanced practice practitioner (APP) assisted throughout the case.  The APP was essential in retraction and counter traction when needed to make the case progress smoothly.  This retraction and assistance made it possible to see the tissue plans for the procedure.  The assistance was needed for blood control, tissue re-approximation and assisted with closure of the incision site.

## 2020-02-14 NOTE — Op Note (Signed)
Procedure(s): STERNAL WASHOUT WITH WOUND VAC CHANGE GREATER OMENTAL FLAP CLOSURE OF STERNUM OPEN TRACHEOSTOMY USING #8 SHILEY OPEN INSERTION OF 16 FR. BOLUS GASTROSTOMY TUBE 16 FR JEJUNOSTOMY INSERTION  TRANSESOPHAGEAL ECHOCARDIOGRAM (TEE) Procedure Note  Corey Palmer male 50 y.o. 02/21/2020 Procedure(s) and Anesthesia Type:    * STERNAL WASHOUT WITH WOUND VAC CHANGE - General    * GREATER OMENTAL FLAP CLOSURE OF STERNUM - General    * OPEN TRACHEOSTOMY USING #8 SHILEY - General    * OPEN INSERTION OF 16 FR. BOLUS GASTROSTOMY TUBE - General    * 16 FR JEJUNOSTOMY INSERTION  - General    * TRANSESOPHAGEAL ECHOCARDIOGRAM (TEE)  Surgeon(s) and Role:    * [name], MD - Primary   Indications: The patient status post CABG which is complicated by persistent heart failure and need for open chest.  He is taken to the operating room for tracheostomy feeding tube access and omental flap closure of the sternum     Surgeon: [name]   Assistants: Staff  Anesthesia: General endotracheal anesthesia  ASA Class: 5    Procedure Detail  STERNAL WASHOUT WITH WOUND VAC CHANGE, GREATER OMENTAL FLAP CLOSURE OF STERNUM, OPEN TRACHEOSTOMY USING #8 SHILEY, OPEN INSERTION OF 16 FR. BOLUS GASTROSTOMY TUBE, 16 FR JEJUNOSTOMY INSERTION , TRANSESOPHAGEAL ECHOCARDIOGRAM (TEE) After informed consent, the patient was taken to the operating room and the above listed date.  He is placed in the supine position on the operating table.  Anesthesia was begun a smooth fashion with a general endotracheal technique.  After confirming adequate anesthesia the anterior chest neck and abdomen were cleansed and draped in a sterile field using DuraPrep solution.  A preop surgical pause was performed.  The existing wound VAC sponge was removed from the mediastinum and the sternal edges and mediastinum were gently irrigated.  An upper midline laparotomy incision was made and the abdomen was entered safely  without injury to underlying structures.  The greater omentum was mobilized off the transverse colon such that it could be transposed into the mediastinum.  A defect was created in the diaphragm through which the mobilized omentum was brought into the mediastinal defect.  Next a 92 French gastrostomy tube was inserted through a separate stab incision in the left upper abdomen and secured with pursestring sutures into the anterior wall of the stomach which were then affixed to the abdominal wall itself.  When this was secured a 67 French jejunostomy tube was inserted approximately 1 foot past the ligament of Treitz.  This was again inserted through a separate stab incision in the left upper abdomen and secured with pursestring sutures in the bowel wall.  The abdomen was then closed with a #1 looped PDS running suture.  Staples used to reapproximate the skin.  The wound VAC was then placed over the sternal defect making sure that the omentum was stable and its position.  Finally a transverse incision was made approximately 2 fingerbreadths above the sternal notch.  Dissection was carried down through the subcutaneous tissue.  The platysma muscle was divided.  The strap muscles were mobilized laterally.  The anterior aspect of the trachea was identified and incision was made in the third tracheal interspace.  This was then extended and the existing endotracheal tube was seen to be withdrawn visually.  A and 8 Shiley appliance was inserted directly into the tracheotomy and secured to the skin.  There was immediate return of CO2 once the tracheostomy was attached  to the mechanical ventilation circuit.  This concluded the procedure all sponge instrument and needle counts were correct.  Estimated Blood Loss:  Minimal         Drains: None          Blood Given: none          Specimens: None         Implants: none        Complications:  * No complications entered in OR log *         Disposition: ICU - intubated  and critically ill.         Condition: stable

## 2020-02-14 NOTE — Progress Notes (Signed)
Corey Palmer for Heparin Indication: Impella 5.5  Allergies  Allergen Reactions  . Orange Fruit Anaphylaxis, Hives and Other (See Comments)    Per Pt- Blisters around lips and Hives all over, also  . Penicillins Anaphylaxis    Did it involve swelling of the face/tongue/throat, SOB, or low BP? Yes Did it involve sudden or severe rash/hives, skin peeling, or any reaction on the inside of your mouth or nose? Yes Did you need to seek medical attention at a hospital or doctor's office? No When did it last happen?childhood If all above answers are "NO", may proceed with cephalosporin use.  Corey Palmer [Empagliflozin] Itching  . Basaglar Kwikpen [Insulin Glargine] Nausea And Vomiting    Patient Measurements: Height: 6\' 3"  (190.5 cm) Weight:  (bed scale broken) IBW/kg (Calculated) : 84.5 Heparin dosing wt: 101 kg  Vital Signs: Temp: 98.2 F (36.8 C) (01/17 0400) Temp Source: Axillary (01/17 0400) Pulse Rate: 83 (01/17 0700)  Labs: Recent Labs    02/12/20 0318 02/12/20 1604 02/13/20 0327 02/13/20 1550 02/25/2020 0338  HGB 9.5*  --  9.1*  --  9.1*  HCT 29.3*  --  28.4*  --  29.6*  PLT 445*  --  446*  --  440*  HEPARINUNFRC <0.10*  --  <0.10*  --  <0.10*  CREATININE 1.41*   < > 1.34* 1.33* 1.30*   < > = values in this interval not displayed.    Estimated Creatinine Clearance: 80.3 mL/min (A) (by C-G formula based on SCr of 1.3 mg/dL (H)).   Assessment: 50 yo male s/p impella 5.5 placement. Pt started on heparinized purge and systemic heparin. Pt now s/p CABG 12/15 with Impella remaining in place c/b significant bleeding and hematoma requiring opened chest and reexploration in OR 12/16. Chest closed on 12/23, re-opened on 12/24. Underwent cardioversion in OR 12/28, s/p sternal wash out/application of wound VAC.   Underwent sternal washout, wound VAC change, followed by sternal closure on 1/5. Chest was re-opened on 1/5 given compromised  hemodynamics.   Heparin remains in purge only. Heparin level undetectable as expected, CBC stable. LDH stable at 409. Impella at P3, purge flow rate 8.8 (440 units/hr), pressures 500s. No s/sx of bleeding.    Goal of Therapy:  Monitor platelets by anticoagulation protocol: Yes   Plan:  Continue heparinized purge No systemic heparin for now F/u daily CBC, heparin level, for s/sx of bleeding F/u post Norris, PharmD, [place] Pharmacist  Phone: [phone] 02/28/2020 8:35 AM  Please check AMION for all [place] phone numbers After 10:00 PM, call [place] [phone]

## 2020-02-14 NOTE — Progress Notes (Signed)
Around 1700, patient's wound vac started having suction alarms; stating there was a blockage. Attempted to trouble shoot without success. Dr. Cyndia Bent assessed it also. Dr. [name] called at [phone] and stated he would be by to fix it. Patient and patient's vitals remained stable. Dr. [name] to bedside at 1800 to evaluate - was able to correct the problem by disconnecting suction tubing and removing a large clot; wound vac dressing did not need to be changed.  Joellen Jersey, RN

## 2020-02-14 NOTE — Progress Notes (Addendum)
NAME:  Corey Palmer, MRN:  [id number], DOB:  02/01/1970, LOS: 46 ADMISSION DATE:  01/27/2020, CONSULTATION DATE: 01/25/2020 REFERRING MD: Aundra Dubin, CHIEF COMPLAINT: Respiratory failure post Impella  HPI/course in hospital  50 year old man admitted with A. fib/RVR, torsades, cardiogenic shock [EF 15] requiring Impella, CABG x5, AKI requiring initiation of CRRT  Past Medical History:    has a past medical history of Anginal pain ([place]), Anxiety, Arthritis, Automatic implantable cardioverter-defibrillator in situ (2007), Bipolar disorder ([place]), CAD (coronary artery disease), Cancer ([place]) (2013), CHF (congestive heart failure) ([place]), Chondrocalcinosis of right knee (5/78/4696), Chronic systolic heart failure ([place]), Depression, Diabetes uncomplicated adult-type II, Dilated cardiomyopathy ([place]), Dyslipidemia, Dysrhythmia, GERD (gastroesophageal reflux disease), Gout, unspecified, Hepatic steatosis (2011), History of kidney stones, echocardiogram, Hypertension, Obesity (BMI 30.0-34.9), Pacemaker, Paroxysmal atrial fibrillation ([place]), Peptic ulcer, Shortness of breath, and Sleep apnea.  Significant Hospital Events:  12/2 Afib with RVR. Cardioverted in ED. AICD did not fire. Milrinone for low co ox.  12/7 ICD fired, Torsades- likely from low K of 2.8.  12/8 Cath- multivessel disease w/ low CO. Volume overloaded.  12/11 Underwent Impella 5.5 insertion for persistent symptoms of forward failure with low cardiac indices. 12/12 Back to the OR for displacement of Impella. Extubated.  12/15 CABG x4, with LIMA-LAD, SVG-PDA/PLV, SVG-OM 12/16 Emergently opened chest at bedside for tamponade, clot compressing right atrium  12/16 To OR for Exploration of chest due to bleeding  12/19 Started CRRT 12/23 Sternal Closure in OR 12/24 Worsening shock.  Chest reopened at bedside with improvement in hemodynamics.  No mediastinal hematoma. 12/25 A flutter > rapid a paced to sinus rhythm.  Remains on high-dose pressors,  inotropes 12/28 Weaning vasopressor requirements.  For chest washout today.  Back in atrial fibrillation, refractory to increased amiodarone and attempted overdrive pacing.  DCCV to NSR. Tolerating fluid removal via CRRT. 1/3: Appears euvolemic.  Adjusting glycemic control.  Chest still open so not weaning 1/4, spiking fever, Vanco meropenem resumed after culture sent from blood and sputum. 1/5 awaiting return to the OR for wound VAC dressing assessment fever curve and white blood cell count improving after antibiotics resumed the day prior, did have chest closed, developed tamponade physiology shortly after had to go back for emergent reopening of chest, return to intensive care and refractory shock 1/6: Wound VAC dressing replaced.  Still in shock.  Starting to wean sedation 1/7 >> : Still on high-dose pressors, CRRT 1/15 recurrent Vtach, shocked  Consults:  PCCM, nephrology, cardiology, cardiothoracic surgery, EP  Procedures:  12/2 R PICC >> 12/11 right ax impella 5.5 >> 12/15 ETT >> 12/15 right femoral arterial sheath >> 12/19 right femoral HD >> 12/23 chest tube-right pleural, left pleural , mediastinal  >> 1/10  Trach->1/11 1/10 PEG->1/11  Significant Diagnostic Tests:    Micro Data:  See micro tab  Antimicrobials:  See fever tab  Interval history:  No events. Out of Vtach. On CRRT/pressors. Still has abd pain. On PS on vent.  Objective   Blood pressure 105/61, pulse 74, temperature 98.2 F (36.8 C), temperature source Axillary, resp. rate (!) 21, height 6\' 3"  (1.905 m), weight 98 kg, SpO2 100 %. CVP:  [9 mmHg-17 mmHg] 17 mmHg  Vent Mode: PSV;CPAP FiO2 (%):  [40 %] 40 % Set Rate:  [28 bmp] 28 bmp Vt Set:  [510 mL] 510 mL PEEP:  [5 cmH20] 5 cmH20 Pressure Support:  [15 cmH20] 15 cmH20 Plateau Pressure:  [25 cmH20-26 cmH20] 25 cmH20   Intake/Output Summary (Last 24  hours) at 02/21/2020 0916 Last data filed at 02/05/2020 0900 Gross per 24 hour  Intake 4065.82 ml   Output 5469 ml  Net -1403.18 ml   Filed Weights   02/11/20 0500 02/12/20 0500 02/13/20 0500  Weight: 98.9 kg 98.3 kg 98 kg   CVP:  [9 mmHg-17 mmHg] 17 mmHg  Examination: Constitutional: ill appearing man lying in bed  Eyes: EOMI, pupils equal Ears, nose, mouth, and throat: trach in place, minimal secretions Cardiovascular: distant, vac in place, ext warm Respiratory: scattered rhonci, triggering vent Gastrointestinal: soft, mild TTP, +BS, rectal tube in place Skin: No rashes, normal turgor Neurologic: moves all 4 ext to command Psychiatric: RASS 0    SvO2 71% on P4 impella, weaned to P3 this AM LDH stable Sugars good No new chest x-ray WBC stable  Assessment & Plan:   Acute hypoxic and hypercapnic respiratory failure requiring mechanical ventilation following cardiac surgery, post tracheostomy, improved, biggest issues are deconditioning and continued shock Persistent small right sided loculated pleural effusions- doubt contributing to resp failure, further management per TCTS Recurrrent Vtach- plans are now to transition to mexilitine per CHF Cardiogenic shock s/p high risk CABG complicated by tamponade requiring open chest now post omental flap closure, vac on impella wean, eventual plans for muscle flap closure thought to be today, unclear if concurrent impella removal, per TCTS and CHF teams Vasoplegia- from prolonged critical illness on midodrine, midodrine to be decreased by CHF team Dysphagia- s/p GJ tube Cardiorenal syndrome- now on CRRT Prolonged QT- complicates PAD control Persistent agitation/anxiety/pain- remains on dilaudid gtt Profound muscular deconditioning postop due to prolonged critical illness Afib PTA  - To OR today for muscle flap closure with TCTS/plastics, okay from my standpoint to try PS trials if ectomy controlled - AC per TCTS and cardiology - Continue klonipin, start PO dilaudid, try to wean dilaudid drip - Inotrope, pressor, and impella  wean per TCTS and CHF teams - CRRT per nephrology and CHF teams - Antiarrhythmics per EP team - Guarded prognosis  Daily Goals Checklist  Pain/Anxiety/Delirium protocol (if indicated): dilaudid gtt, weaning as able VAP protocol (if indicated): bundle in place. DVT prophylaxis:  Full dose asa Nutrition Status: TF GI prophylaxis: pantoprazole Glucose control: insulin gtt Mobility/therapy needs: bed rest  Code Status: full  Family Communication: per primary Disposition: ICU   Patient critically ill due to persistent shock and respiratory failure Interventions to address this today vent wean, pressor titration Risk of deterioration without these interventions is high  I personally spent 33 minutes providing critical care not including any separately billable procedures  Erskine Emery MD [facility] 02/26/2020 9:16 AM Personal pager: [phone] If unanswered, please page CCM On-call: [phone]

## 2020-02-14 NOTE — Progress Notes (Signed)
Patient was resting comfortably and startled awake around 1500. He kept trying to talk/mouth words but was having difficulty. RN eventually able to determine he was having severe abd pain. Tube feeds had been restarted about 45 minutes prior, but were placed on hold again. Dr. Tamala Julian notified. New orders received. KUB obtained. Patient more comfortable after dilaudid bolus.  Joellen Jersey, RN

## 2020-02-14 NOTE — Progress Notes (Addendum)
Patient ID: Corey Palmer, male   DOB: 20-Dec-1970, 50 y.o.   MRN: [id number]     Advanced Heart Failure Rounding Note  PCP-Cardiologist: No primary care provider on file.   Subjective:    - 12/7 Torsades -ICD shock x1.  - 12/11 Impella 5.5 placed.  - 12/12 Impella repositioned in OR - 12/15 CABG x 4 with LIMA-LAD, SVG-PDA/PLV, SVG-OM - 12/16 DCCV afib.  Back to OR to re-open chest and again to evacuate hematoma.  - 12/19 CVVH initiated  - 12/23 Chest closed - 12/24 Chest reopened - 12/26 RAP back into NSR - 12/27 back in atrial fibrillation, unable to rapid atrial pace out.   - 12/28 To OR, chest partially closed and wound vac placed.  DCCV to NSR.  - 12/31 Antibiotics stopped. Swan removed, HD catheter moved from right femoral to right IJ.  - 1/5 To OR for chest closure. Chest reopened emergently at bedside again to evacuate hematoma  - 1/10 To OR: omental flap closure of chest with wound vac, tracheostomy, G-tube, J-tube.    - 1/12 Plastics and EP consulted.  - 1/13 Palliative Care consulted.  - 1/15 Went into VT, Shock 200 J and amiodarone bolus 150.   - 1/16: Milrinone stopped.     On NE 22 + VP 0.03 + midodrine 30 tid + 30 amio drip + Lidocaine 1 gtt.  Impella P-4. Co-ox 72%. CVP 14.  Alert, appears agitated, follows commands.    Remains on CVVH pulling 50 mL/H   Impella 5.5 P-4 Flow 2.2 LDH 383=>410 =>412 => 437=> 403 => 409   Objective:   Weight Range: 98 kg Body mass index is 27 kg/m.   Vital Signs:   Temp:  [97.8 F (36.6 C)-98.2 F (36.8 C)] 98.2 F (36.8 C) (01/17 0400) Pulse Rate:  [55-104] 77 (01/17 0515) Resp:  [28-35] 28 (01/17 0224) BP: (103-126)/(49-62) 126/56 (01/16 1956) SpO2:  [93 %-100 %] 99 % (01/17 0515) Arterial Line BP: (84-333)/(45-325) 100/62 (01/17 0515) FiO2 (%):  [40 %] 40 % (01/17 0400) Last BM Date: 02/10/20  Weight change: Filed Weights   02/11/20 0500 02/12/20 0500 02/13/20 0500  Weight: 98.9 kg 98.3 kg 98 kg     Intake/Output:   Intake/Output Summary (Last 24 hours) at 02/03/2020 0552 Last data filed at 02/20/2020 0500 Gross per 24 hour  Intake 4637.06 ml  Output 5695 ml  Net -1057.94 ml      Physical Exam   CVP 14 General:  Alert but agitated Neck: supple, trach.  Cor: Regular rate & rhythm. No rubs, gallops or murmurs. Lungs: Bibasilar coarse.  Abdomen: soft, nontender, nondistended.  Extremities: no cyanosis, clubbing, rash.  Dependent posterior thighs edema. Neuro: alert, agitated, follows commands, moves extremities   Telemetry   SR with rates 70-80s.  Personally reviewed.   Labs    CBC Recent Labs    02/13/20 0327 02/21/2020 0338  WBC 13.7* 15.4*  NEUTROABS 10.2* 11.9*  HGB 9.1* 9.1*  HCT 28.4* 29.6*  MCV 88.2 88.6  PLT 446* 976*   Basic Metabolic Panel Recent Labs    02/13/20 0327 02/13/20 1550 02/09/2020 0338  NA 134* 132* 133*  K 4.5 4.3 4.3  CL 101 100 101  CO2 '24 22 22  ' GLUCOSE 214* 206* 121*  BUN 33* 34* 37*  CREATININE 1.34* 1.33* 1.30*  CALCIUM 8.3* 8.0* 8.2*  MG 2.3  --  2.2  PHOS 1.9* 1.6* 2.0*   Liver Function Tests Recent Labs  02/13/20 1550 02/20/2020 0338  ALBUMIN 1.8* 1.7*   No results for input(s): LIPASE, AMYLASE in the last 72 hours. Cardiac Enzymes No results for input(s): CKTOTAL, CKMB, CKMBINDEX, TROPONINI in the last 72 hours.  BNP: BNP (last 3 results) Recent Labs    01/24/2020 1619  BNP 577.5*    ProBNP (last 3 results) No results for input(s): PROBNP in the last 8760 hours.   D-Dimer No results for input(s): DDIMER in the last 72 hours. Hemoglobin A1C No results for input(s): HGBA1C in the last 72 hours. Fasting Lipid Panel No results for input(s): CHOL, HDL, LDLCALC, TRIG, CHOLHDL, LDLDIRECT in the last 72 hours. Thyroid Function Tests No results for input(s): TSH, T4TOTAL, T3FREE, THYROIDAB in the last 72 hours.  Invalid input(s): FREET3  Other results:   Imaging    DG CHEST PORT 1 VIEW  Result  Date: 02/13/2020 CLINICAL DATA:  Open heart surgery. EXAM: PORTABLE CHEST 1 VIEW COMPARISON:  02/12/2020 FINDINGS: Patient is rotated to the left. Left-sided pacemaker unchanged. Tracheostomy tube in adequate position. Impella device unchanged. Right IJ central venous catheter and bilateral chest tubes unchanged. Catheter extending from below with tip over the region of the aortic arch/AP window unchanged. Lungs are somewhat hypoinflated and demonstrate persistent hazy prominence of the central pulmonary vessels likely mild vascular congestion. Stable pleural based density over the lateral right apex/upper lobe likely loculated pleural fluid. No significant effusion and no evidence of pneumothorax. Mild stable cardiomegaly. Remainder of the exam is unchanged. IMPRESSION: 1. Stable cardiomegaly with suggestion of persistent mild vascular congestion. 2. Tubes and lines as described. 3. Stable pleural based density over the lateral right apex/upper lobe likely loculated pleural fluid. Electronically Signed   By: Marin Olp M.D.   On: 02/13/2020 08:14   ECHOCARDIOGRAM LIMITED  Result Date: 02/13/2020    ECHOCARDIOGRAM LIMITED REPORT   [name] Date of Exam: 02/13/2020 Medical Rec #:  [id number]     Height:       75.0 in Accession #:    [id number]    Weight:       216.0 lb Date of Birth:  [birth year]     BSA:          2.268 m Patient Age:    2 years      BP:           103/62 mmHg Patient Gender: M             HR:           97 bpm. Exam Location:  Inpatient Procedure: Limited Echo STAT ECHO Indications:    Impella position  History:        Patient has prior history of Echocardiogram examinations, most                 recent 02/04/2020. CHF, CAD, Prior CABG; Arrythmias:Atrial                 Fibrillation.  Sonographer:    Clayton Lefort RDCS (AE) Referring Phys: [id number] DANIEL R BENSIMHON IMPRESSIONS  1. Severe biventricular failure. No significant effusion. Pacing wire in RV. Markedly abnormal septal  motion. Impellar device about 4.6 cm distal to arotic annulus with no entrapment. FINDINGS  Additional Comments: Severe biventricular failure. No significant effusion. Pacing wire in RV. Markedly abnormal septal motion. Impellar device about 4.6 cm distal to arotic annulus with no entrapment. Jenkins Rouge MD Electronically signed by Jenkins Rouge MD Signature Date/Time: 02/13/2020/9:18:49 AM  Final      Medications:     Scheduled Medications: . vitamin C  500 mg Per Tube TID  . aspirin  324 mg Per Tube Daily  . atorvastatin  80 mg Per Tube Daily  . busPIRone  10 mg Per Tube TID  . chlorhexidine gluconate (MEDLINE KIT)  15 mL Mouth Rinse BID  . Chlorhexidine Gluconate Cloth  6 each Topical Daily  . darbepoetin (ARANESP) injection - NON-DIALYSIS  100 mcg Subcutaneous Q Sat-1800  . docusate  100 mg Per Tube BID  . feeding supplement (PROSource TF)  45 mL Per Tube TID  . fiber  1 packet Per Tube BID  . FLUoxetine  40 mg Per Tube BID  . mouth rinse  15 mL Mouth Rinse 10 times per day  . melatonin  5 mg Per Tube Daily  . midodrine  30 mg Per Tube TID WC  . multivitamin  1 tablet Per Tube QHS  . oxyCODONE  10 mg Per Tube Q6H  . pantoprazole (PROTONIX) IV  40 mg Intravenous Q12H  . polyethylene glycol  17 g Per Tube Daily  . sodium chloride flush  10-40 mL Intracatheter Q12H  . thiamine  100 mg Per Tube Daily  . valproic acid  250 mg Per Tube BID    Infusions: .  prismasol BGK 4/2.5 500 mL/hr at 02/13/20 1638  .  prismasol BGK 4/2.5 200 mL/hr at 02/13/20 1641  . sodium chloride Stopped (01/29/20 0955)  . sodium chloride    . amiodarone 30 mg/hr (02/12/2020 0500)  . dexmedetomidine (PRECEDEX) IV infusion 0.7 mcg/kg/hr (02/16/2020 0500)  . electrolyte-A Stopped (02/03/20 8937)  . epinephrine Stopped (02/08/20 0651)  . feeding supplement (PIVOT 1.5 CAL) 1,000 mL (02/12/20 0756)  . impella catheter heparin 50 unit/mL in dextrose 5%    . HYDROmorphone 4 mg/hr (02/03/2020 0500)  . insulin 0.9  mL/hr at 02/28/2020 0500  . lactated ringers 20 mL/hr at 02/06/2020 0500  . lidocaine 1 mg/min (02/13/2020 0500)  . norepinephrine (LEVOPHED) Adult infusion 22 mcg/min (02/01/2020 0500)  . prismasol BGK 4/2.5 1,500 mL/hr at 02/13/20 1957  . vasopressin 0.03 Units/min (02/21/2020 0500)    PRN Medications: sodium chloride, Place/Maintain arterial line **AND** sodium chloride, artificial tears, bisacodyl **OR** bisacodyl, docusate, heparin, heparin, HYDROmorphone, lip balm, metoprolol tartrate, midazolam, polyethylene glycol, sodium chloride flush     Assessment/Plan   1. Cardiogenic shock/acute on chronic systolic CHF: Initially nonischemic cardiomyopathy.  Possible familial cardiomyopathy as both parents had cardiomyopathy and died at around 71. However, [employer] gene testing did not show any common mutation for cardiomyopathy.  However, this admission noted to have severe 3 vessel disease so suspect component of ischemic cardiomyopathy.  St Jude ICD.  Echo in 8/20 with EF 15% and mildly decreased RV function. Echo this admission with EF < 20%, moderate LV dilation, RV mildly reduced, severe LAE, no significant MR. Low output HF with markedly low EF.  He has a long history of cardiomyopathy (20 yrs), tends to minimize symptoms.  Cardiorenal syndrome with creatinine up to 2.2,  stabilized on milrinone and Impella 5.5. SCr 1.44 day of CABG. CABG 12/15.  Post-op shock, back to OR 12/16 to open chest (chest wall compartment syndrome) and again to evacuate hematoma.  TEE post-op with severe RV dysfunction.  CVVHD initiated 12/19 for volume removal in setting of rising Scr/decreased UOP. Suspect ATN. S/p chest closure 12/23. Hemodynamics much worse on 12/24 with rising pressor demands. Co-ox 36%.  Bedside echo severe  biventricular dysfunction with marked septal bounce. Chest re-opened 12/24.  Was atrial paced back to NSR on 12/26 with improvement in hemodynamics but back in atrial fibrillation on 12/27 and unable to  pace out, DCCV to NSR on 12/28. Went back to OR 1/5 for chest closure but several hours later required emergent re-opening of chest at bedside to evacuate hematoma. To OR again 1/10 with closure of chest with omental flap.  - Remains on VP 0.03 + Norepi 22. Impella P-4 with co-ox 72% - Continue CVVH.  - Continue to wean NE as tolerated (Goal MAP 70-90).  2. Atrial fibrillation: H/o PAF.  He was on dofetilide in the remote past but this was stopped due to noncompliance.  He had an upper GI bleed from antral ulcers in 3/12. He was seen by GI and was deemed safe to restart anticoagulation as long as he remains on a PPI.  No apparent recurrence of AF until just prior to this admission, was cardioverted back to NSR in ER but went back into atrial fibrillation.  Post-op afib, DCCV to NSR/BiV pacing am 12/16 -> back to AF. Rapid atrial pacing back to SR  - SR on tele - C/w amiodarone 30 mg/hr.   - Getting heparin through impella purge 3. CAD:   Cath this admission with severe 3 vessel disease. CABG x 4 on 12/15.  - Continue atorvastatin, ASA.  4. Acute on chronic hypoxemic respiratory failure: Vent per CCM.  Has tracheostomy.  - CCM following.  5. AKI on CKD stage 3: Likely ATN/cardiorenal. Anuric. On CVVH.    - Pulling 50 mL/H 6. Diabetes: Insulin.  7. Hyponatremia: Resolved with CVVH.  8. Torsades/ICD shock: 12/7/212 in hospital event, in setting of severe hypokalemia and hypomagnesemia.   9. Gout: h/o gout. Complained of rt knee pain c/w previous flares - treated w/ prednisone burst (completed).  10: ID: completed abx for PNA.  Afebrile.  Cultures NGTD.   11. FEN: On TFS s via J-tube. 12. Anemia: Stable 9.1 today.  Transfuse < 7.5.  13. Stage II Pressure Ulcer- sacrum -Continue to reposition every 2 hours R/L  14. Pleural Effusion  - loculated rt apical pleural effusion noted on imaging.  - continue UF through CVVH  - PCCM following  15. VT - Having runs NSVT required amio 150 bolus,  followed by shock 200J.   - keep K > 4.0 and Mg > 2.0 - wean pressors as tolerated  16. Abd pain - G-J tube flushed at bedside  - CT C/A/P showed no source for the abdominal pain - TFs ongoing.    Carlene Coria, NP 02/10/2020 5:52 AM   Patient seen with NP, agree with the above note.  VT quiescent over the last day on amiodarone gtt + mexiletine gtt.    Echo from yesterday noted, right-shifted septum with decrease in Impella speed.   Stable night, remains on NE 22 + vasopressin 0.03.  Co-ox 72%.  CVP 11, CVVH ongoing with net UF 50-75 cc/hr.   General: NAD, awake Neck: Tracheostomy.  JVP 10 cm, no thyromegaly or thyroid nodule.  Lungs: Decreased at bases. CV: Chest wall with wound vac.  Heart regular S1/S2, no S3/S4, no murmur.  No peripheral edema.   Abdomen: Soft, nontender, no hepatosplenomegaly, no distention.  Skin: Intact without lesions or rashes.  Neurologic: Awake, follows commands.  Extremities: No clubbing or cyanosis.  HEENT: Normal.   Agree with yesterday's assessment that we should leave the sternum open for now  to heal by secondary intention. Will try to focus on weaning Impella.  He is not a candidate for LVAD or transplant at this point with RRT, so if we cannot wean and remove Impella there is really no chance for recovery.  He may not tolerate Impella wean well as he is not optimally unloaded at P4 based on yesterday's echo, but he remained stable by vitals overnight and co-ox is 72%.  - Decrease Impella to P3 today and follow. Getting heparin via purge only, LDH stable at 409.  - Continue slow pressor wean, vasopressin to 0.02 today. Continue NE for now.  - Continue to pull UF gently, 50 cc/hr net.   VT quiescent, will continue amiodarone and stop lidocaine gtt.  Start mexiletine 150 mg q8 hrs.   TFs ongoing.   CRITICAL CARE Performed by: Loralie Champagne  Total critical care time: 40 minutes  Critical care time was exclusive of separately billable  procedures and treating other patients.  Critical care was necessary to treat or prevent imminent or life-threatening deterioration.  Critical care was time spent personally by me on the following activities: development of treatment plan with patient and/or surrogate as well as nursing, discussions with consultants, evaluation of patient's response to treatment, examination of patient, obtaining history from patient or surrogate, ordering and performing treatments and interventions, ordering and review of laboratory studies, ordering and review of radiographic studies, pulse oximetry and re-evaluation of patient's condition.  Loralie Champagne 02/04/2020 8:15 AM

## 2020-02-14 NOTE — Transfer of Care (Signed)
Immediate Anesthesia Transfer of Care Note  Patient: Corey Palmer  Procedure(s) Performed: STERNAL WOUND DEBRIDEMENT WITH PARTIAL CLOSURE OF STERNAL WOUND (N/A Chest) APPLICATION OF A-CELL (N/A Chest) APPLICATION OF WOUND VAC (N/A Chest)  Patient Location: ICU  Anesthesia Type:General  Level of Consciousness: Patient remains intubated per anesthesia plan  Airway & Oxygen Therapy: Patient remains intubated per anesthesia plan and Patient placed on Ventilator (see vital sign flow sheet for setting)  Post-op Assessment: Report given to RN and Post -op Vital signs reviewed and stable  Post vital signs: Reviewed and stable  Last Vitals:  Vitals Value Taken Time  BP 106/60   Temp    Pulse 73 02/04/2020 1231  Resp 22 02/15/2020 1232  SpO2 90 % 02/06/2020 1231  Vitals shown include unvalidated device data.  Last Pain:  Vitals:   02/21/2020 0400  TempSrc: Axillary  PainSc:       Patients Stated Pain Goal: 0 (03/00/92 3300)  Complications: No complications documented.

## 2020-02-14 NOTE — Anesthesia Preprocedure Evaluation (Signed)
Anesthesia Evaluation  Patient identified by MRN, date of birth, ID band Patient awake and Patient unresponsive  General Assessment Comment:Please see previous charts  S/p trach/peg 02/06/2020  Reviewed: Allergy & Precautions, H&P , NPO status , Patient's Chart, lab work & pertinent test results  Airway Mallampati: Trach  TM Distance: >3 FB Neck ROM: Full    Dental no notable dental hx.    Pulmonary sleep apnea ,    Pulmonary exam normal breath sounds clear to auscultation       Cardiovascular hypertension, + CAD, + CABG and +CHF  Normal cardiovascular exam+ pacemaker + Cardiac Defibrillator  Rhythm:Regular Rate:Normal  impella    Neuro/Psych PSYCHIATRIC DISORDERS Anxiety Depression Bipolar Disorder negative neurological ROS     GI/Hepatic Neg liver ROS, GERD  ,  Endo/Other  diabetes  Renal/GU Renal disease  negative genitourinary   Musculoskeletal negative musculoskeletal ROS (+)   Abdominal   Peds negative pediatric ROS (+)  Hematology negative hematology ROS (+)   Anesthesia Other Findings   Reproductive/Obstetrics negative OB ROS                             Anesthesia Physical  Anesthesia Plan  ASA: IV  Anesthesia Plan: General   Post-op Pain Management:    Induction: Intravenous  PONV Risk Score and Plan: 0  Airway Management Planned: Oral ETT  Additional Equipment: Arterial line and PA Cath  Intra-op Plan:   Post-operative Plan: Post-operative intubation/ventilation  Informed Consent: I have reviewed the patients History and Physical, chart, labs and discussed the procedure including the risks, benefits and alternatives for the proposed anesthesia with the patient or authorized representative who has indicated his/her understanding and acceptance.     Dental advisory given  Plan Discussed with: CRNA and Surgeon  Anesthesia Plan Comments: (Critically ill male,  who has been in the ICU greater than 4 weeks. Multiple bring backs for washouts, and sternal closure)        Anesthesia Quick Evaluation

## 2020-02-14 NOTE — Progress Notes (Signed)
This chaplain was updated on the Pt. progress by the Pt. RN-Cybil. The chaplain understands the Pt. son and wife are navigating the visitor restrictions. This chaplain will F/U with spiritual care on Tuesday.

## 2020-02-14 NOTE — Progress Notes (Addendum)
Came to see patient, he is in the OR for muscle flap closure with TCTS/plastics Seems to have been discharged off tele and unable to see his telemetry data   Dr. Curt Bears saw him yesterday, had slow VT with cycle length of 560 ms. "He is currently on both amiodarone and lidocaine. At this point, it would be difficult to adjust medications further" ICD programming changes made: Max track rate decreased to 90 bpm. VT 1 zone decreased to 115 bpm with ATP x6. VT 2 decreased to 181 bpm with ATP and shocks  In d/w RN this AM, minimal ectopy, no VT overnight  HF note reviewed - 12/7 ICD fired, Torsades- likely from low K of 2.8.  _ 12/8 Cath- multivessel disease w/ low CO. Volume overloaded - 12/11 Impella 5.5 placed.  - 12/12 Impella repositioned in OR - 12/15 CABG x 4 with LIMA-LAD, SVG-PDA/PLV, SVG-OM - 12/16 DCCV afib.  Back to OR to re-open chest and again to evacuate hematoma.  - 12/19 CVVH initiated  - 12/23 Chest closed - 12/24 Chest reopened - 12/26 RAP back into NSR - 12/27 back in atrial fibrillation, unable to rapid atrial pace out.   - 12/28 To OR, chest partially closed and wound vac placed.  DCCV to NSR.  - 12/31 Antibiotics stopped. Swan removed, HD catheter moved from right femoral to right IJ.  - 1/5 To OR for chest closure. Chest reopened emergently at bedside again to evacuate hematoma  - 1/10 To OR: omental flap closure of chest with wound vac, tracheostomy, G-tube, J-tube.    - 1/12 Plastics and EP consulted.  - 1/13 Palliative Care consulted.  - 1/15 Went into VT, Shock 200 J and amiodarone bolus 150.   - 1/16: Milrinone stopped.     today On NE 22 + VP 0.03 + midodrine 30 tid + 30 amio drip + Lidocaine 1 gtt.  Impella P-4. Co-ox 72%. CVP 14.  Alert, appears agitated, follows commands.     Remains on CVVH pulling 50 mL/H  Transitioned from lidocaine gtt > mexiletine today  Dr. Claris Gladden thoughts's "Agree with yesterday's assessment that we should leave the sternum  open for now to heal by secondary intention. Will try to focus on weaning Impella.  He is not a candidate for LVAD or transplant at this point with RRT, so if we cannot wean and remove Impella there is really no chance for recovery.  He may not tolerate Impella wean well as he is not optimally unloaded at P4 based on yesterday's echo, but he remained stable by vitals overnight and co-ox is 72%.  - Decrease Impella to P3 today and follow. Getting heparin via purge only, LDH stable at 409.  - Continue slow pressor wean, vasopressin to 0.02 today. Continue NE for now.  - Continue to pull UF gently, 50 cc/hr net.    VT quiescent, will continue amiodarone and stop lidocaine gtt.  Start mexiletine 150 mg q8 hrs."  EP will see tomorrow I will make Dr. Lovena Le aware  Tommye Standard, PA-C  [facility] Spotted

## 2020-02-14 NOTE — Anesthesia Procedure Notes (Signed)
Date/Time: 02/03/2020 10:47 AM Performed by: Kathryne Hitch, CRNA Pre-anesthesia Checklist: Patient identified, Emergency Drugs available, Suction available and Patient being monitored Patient Re-evaluated:Patient Re-evaluated prior to induction Oxygen Delivery Method: Circle system utilized Preoxygenation: Pre-oxygenation with 100% oxygen Induction Type: IV induction Placement Confirmation: positive ETCO2 Tube secured with: Tape Dental Injury: Teeth and Oropharynx as per pre-operative assessment

## 2020-02-14 NOTE — Progress Notes (Signed)
Patient ID: Corey Palmer, male   DOB: 05-27-1970, 50 y.o.   MRN: [id number] TCTS Evening Rounds:  Had partial closure of sternal wound and placement of VAC sponge today. Impella increased to P5 for procedure and back down to P4 now.  Remains on Vaso and NE 25 (up from 22 preop).  Drainage from [facility] is bloody but not too much so far. Hemodynamics have been mostly stable. CRRT negative 50-70/hr.   BMET    Component Value Date/Time   NA 135 02/18/2020 1533   NA 138 09/08/2018 0924   K 4.7 02/25/2020 1533   CL 102 02/09/2020 1533   CO2 24 02/10/2020 1533   GLUCOSE 127 (H) 02/27/2020 1533   BUN 31 (H) 02/08/2020 1533   BUN 23 09/08/2018 0924   CREATININE 1.35 (H) 02/20/2020 1533   CREATININE 1.13 05/13/2013 1533   CALCIUM 7.9 (L) 02/13/2020 1533   GFRNONAA >60 02/27/2020 1533   GFRNONAA 79 05/13/2013 1533   GFRAA >60 03/08/2019 1515   GFRAA >89 05/13/2013 1533

## 2020-02-14 NOTE — Anesthesia Postprocedure Evaluation (Signed)
Anesthesia Post Note  Patient: JERIME ARIF  Procedure(s) Performed: STERNAL WOUND DEBRIDEMENT WITH PARTIAL CLOSURE OF STERNAL WOUND (N/A Chest) APPLICATION OF A-CELL (N/A Chest) APPLICATION OF WOUND VAC (N/A Chest)     Patient location during evaluation: SICU Anesthesia Type: General Level of consciousness: sedated Pain management: pain level controlled Vital Signs Assessment: post-procedure vital signs reviewed and stable Respiratory status: patient remains intubated per anesthesia plan Cardiovascular status: stable Postop Assessment: no apparent nausea or vomiting Anesthetic complications: no   No complications documented.  Last Vitals:  Vitals:   01/31/2020 1615 02/15/2020 1630  BP:    Pulse: 75 70  Resp: (!) 28 16  Temp:    SpO2: 100% 100%    Last Pain:  Vitals:   02/07/2020 0400  TempSrc: Axillary  PainSc:                  Catalina Gravel

## 2020-02-14 NOTE — Progress Notes (Signed)
[facility] Progress Note    Assessment/ Plan:    58M with familial cardiomyopathy EF20% s/p redo CABG 11/27/11 complicated by persistent shock, multiple attempts at sternal closure with formation of chest hematoma and tamponade, Afib with RVR, and AKI requiring CRRT.    #AKI/CKD stage III- presumably due to ischemic ATN in setting of cardiogenic shock s/p CABG.  CRRT started 01/16/20.  All fluids 4K/2.5Ca.  Right femoral HD catheter placed 01/16/20 switched to RIJ on 01/28/20.  - continue CRRT--> goal net neg 50-75cc/hr.  Trying to wean pressors so closer to 50 - has been on CRRT nearly a month - concern that there is low chance of meaningful renal recovery - replete phos PRN   # CAD s/p CABG complicated by cardiogenic shock and post-op hematoma. secondary closure on 12/23 then had to be re opened.  Went to the OR for washout on 12/28, bedside washout 02/01/19.  OR 02/01/2020 for closure, then had emergent reopening at the bedside 02/12/2020 d/t anterior chest hematoma again. OR for chest closure on 1/10. -per charting pec flap closure on hold, plan is to let him heal via wound vac and d/c impella  # Cardiogenic shock- underlying familial cardiomyopathy s/p redo CABG, EF ~20%. Remains on pressors:  Also on milrinone. Impella per team. Continue mgmt per primary. Midodrine 30 tid  # ABLA- s/p postoperative bleeding into chest cavity s/p re-exploration for tamponade/hematoma. Received multiple blood products  # Atrial fibrillation - s/p DCCV on 12/16.  On amiodarone.   # VDRF- per primary svc. Trach/peg 1/10  #-Leukocytosis/ new fever: cultures redrawn 02/01/20, s/p vanc and meropenem  # R loculated pleural effusion: for CT chest on 1/6 with contrast   #NSVT: ep on board, lido  Subjective:     Seen and examined on CRRT; procedure supervised.  Has continued on CRRT.  He had 4.7 liters UF over 1/16 with CRRT.  Per RN they are trying to wean impella and trying to wean vaso  and lidocaine.    Review of systems: limited secondary to trach and clinical status but denies air hunger     Objective:   BP (!) 126/56   Pulse 83   Temp 98.2 F (36.8 C) (Axillary)   Resp (!) 28   Ht '6\' 3"'  (1.905 m)   Wt 98 kg   SpO2 100%   BMI 27.00 kg/m   Intake/Output Summary (Last 24 hours) at 02/05/2020 0749 Last data filed at 02/20/2020 0700 Gross per 24 hour  Intake 4509.05 ml  Output 5331 ml  Net -821.95 ml   Weight change:   Physical Exam:  Gen: adult male nad CVS: normal sinus - RRR CHEST: woundvac foam in place, R [place] Impella Resp: clear but diminished anteriorly trach Abd: soft ND Ext:1-2+ edema hips and legs Neuro: awake and follows some simple commands ACCESS: R IJ nontunneled HD cath  Imaging: DG CHEST PORT 1 VIEW  Result Date: 02/13/2020 CLINICAL DATA:  Open heart surgery. EXAM: PORTABLE CHEST 1 VIEW COMPARISON:  02/12/2020 FINDINGS: Patient is rotated to the left. Left-sided pacemaker unchanged. Tracheostomy tube in adequate position. Impella device unchanged. Right IJ central venous catheter and bilateral chest tubes unchanged. Catheter extending from below with tip over the region of the aortic arch/AP window unchanged. Lungs are somewhat hypoinflated and demonstrate persistent hazy prominence of the central pulmonary vessels likely mild vascular congestion. Stable pleural based density over the lateral right apex/upper lobe likely loculated pleural fluid. No significant effusion and no evidence  of pneumothorax. Mild stable cardiomegaly. Remainder of the exam is unchanged. IMPRESSION: 1. Stable cardiomegaly with suggestion of persistent mild vascular congestion. 2. Tubes and lines as described. 3. Stable pleural based density over the lateral right apex/upper lobe likely loculated pleural fluid. Electronically Signed   By: Marin Olp M.D.   On: 02/13/2020 08:14   ECHOCARDIOGRAM LIMITED  Result Date: 02/13/2020    ECHOCARDIOGRAM LIMITED REPORT   Patient  Name:   Corey Palmer Date of Exam: 02/13/2020 Medical Rec #:  [id number]     Height:       75.0 in Accession #:    [id number]    Weight:       216.0 lb Date of Birth:  March 01, 1970     BSA:          2.268 m Patient Age:    50 years      BP:           103/62 mmHg Patient Gender: M             HR:           97 bpm. Exam Location:  Inpatient Procedure: Limited Echo STAT ECHO Indications:    Impella position  History:        Patient has prior history of Echocardiogram examinations, most                 recent 02/04/2020. CHF, CAD, Prior CABG; Arrythmias:Atrial                 Fibrillation.  Sonographer:    Clayton Lefort RDCS (AE) Referring Phys: [id number] DANIEL R BENSIMHON IMPRESSIONS  1. Severe biventricular failure. No significant effusion. Pacing wire in RV. Markedly abnormal septal motion. Impellar device about 4.6 cm distal to arotic annulus with no entrapment. FINDINGS  Additional Comments: Severe biventricular failure. No significant effusion. Pacing wire in RV. Markedly abnormal septal motion. Impellar device about 4.6 cm distal to arotic annulus with no entrapment. Jenkins Rouge MD Electronically signed by Jenkins Rouge MD Signature Date/Time: 02/13/2020/9:18:49 AM    Final    CT CHEST ABDOMEN PELVIS WO CONTRAST  Result Date: 02/12/2020 CLINICAL DATA:  Possible abscess or infection. EXAM: CT CHEST, ABDOMEN AND PELVIS WITHOUT CONTRAST TECHNIQUE: Multidetector CT imaging of the chest, abdomen and pelvis was performed following the standard protocol without IV contrast. COMPARISON:  October 14, 2011. FINDINGS: CT CHEST FINDINGS Cardiovascular: Mild cardiomegaly is noted. No evidence of thoracic aortic aneurysm. Left-sided pacemaker is unchanged in position. No pericardial effusion is noted. Impella device is noted with tip in the left ventricle. Mediastinum/Nodes: Tracheostomy tube appears to be in good position. Thyroid gland is unremarkable. Esophagus is unremarkable. No adenopathy is noted. Lungs/Pleura: No pneumothorax  is noted moderate size right pleural effusion is noted which may be loculated, with adjacent atelectasis or infiltrate involving the right upper and lower lobes. Left lower lobe airspace opacity is noted concerning for pneumonia or atelectasis. Two right-sided chest tubes are noted. One left-sided chest tube is noted. Musculoskeletal: Sternotomy defect is noted. No other bony abnormality is noted. CT ABDOMEN PELVIS FINDINGS Hepatobiliary: No focal liver abnormality is seen. Status post cholecystectomy. No biliary dilatation. Pancreas: Unremarkable. No pancreatic ductal dilatation or surrounding inflammatory changes. Spleen: Normal in size without focal abnormality. Adrenals/Urinary Tract: Adrenal glands are unremarkable. Kidneys are normal, without renal calculi, focal lesion, or hydronephrosis. Bladder is unremarkable. Stomach/Bowel: Gastrostomy tube appears to be in good position. Jejunostomy tube is also noted and left lower  quadrant. There is no evidence of bowel obstruction or inflammation. The appendix is unremarkable. Rectal tube is noted. Vascular/Lymphatic: Aortic atherosclerosis. No enlarged abdominal or pelvic lymph nodes. Reproductive: Prostate is unremarkable. Other: No abdominal wall hernia or abnormality. No abdominopelvic ascites. Musculoskeletal: No acute or significant osseous findings. IMPRESSION: 1. Moderate size right pleural effusion is noted which may be loculated, with adjacent atelectasis or infiltrate involving the right upper and lower lobes. 2. Left lower lobe airspace opacity is noted concerning for pneumonia or atelectasis. 3. Tracheostomy tube appears to be in good position. 4. Gastrostomy tube appears to be in good position. Jejunostomy tube is also noted in the left lower quadrant. 5. No other significant abnormality seen in the chest, abdomen or pelvis. 6. Aortic atherosclerosis. Aortic Atherosclerosis (ICD10-I70.0). Electronically Signed   By: Marijo Conception M.D.   On: 02/12/2020  14:45    Labs: BMET Recent Labs  Lab 02/11/20 0315 02/11/20 1622 02/12/20 0318 02/12/20 1604 02/13/20 0327 02/13/20 1550 02/12/2020 0338  NA 135 134* 133* 133* 134* 132* 133*  K 4.3 4.7 4.5 4.7 4.5 4.3 4.3  CL 101 101 100 101 101 100 101  CO2 '25 24 23 23 24 22 22  ' GLUCOSE 162* 213* 126* 159* 214* 206* 121*  BUN 30* 33* 35* 36* 33* 34* 37*  CREATININE 1.40* 1.47* 1.41* 1.29* 1.34* 1.33* 1.30*  CALCIUM 8.1* 7.9* 8.3* 7.9* 8.3* 8.0* 8.2*  PHOS 2.1* 2.3* 2.2* 2.5 1.9* 1.6* 2.0*   CBC Recent Labs  Lab 02/11/20 0315 02/12/20 0318 02/13/20 0327 02/23/2020 0338  WBC 14.4* 16.6* 13.7* 15.4*  NEUTROABS 10.6* 12.1* 10.2* 11.9*  HGB 9.1* 9.5* 9.1* 9.1*  HCT 28.4* 29.3* 28.4* 29.6*  MCV 89.3 88.8 88.2 88.6  PLT 334 445* 446* 440*    Medications:    . vitamin C  500 mg Per Tube TID  . aspirin  324 mg Per Tube Daily  . atorvastatin  80 mg Per Tube Daily  . busPIRone  10 mg Per Tube TID  . chlorhexidine gluconate (MEDLINE KIT)  15 mL Mouth Rinse BID  . Chlorhexidine Gluconate Cloth  6 each Topical Daily  . darbepoetin (ARANESP) injection - NON-DIALYSIS  100 mcg Subcutaneous Q Sat-1800  . docusate  100 mg Per Tube BID  . feeding supplement (PROSource TF)  45 mL Per Tube TID  . fiber  1 packet Per Tube BID  . FLUoxetine  40 mg Per Tube BID  . mouth rinse  15 mL Mouth Rinse 10 times per day  . melatonin  5 mg Per Tube Daily  . midodrine  30 mg Per Tube TID WC  . multivitamin  1 tablet Per Tube QHS  . oxyCODONE  10 mg Per Tube Q6H  . pantoprazole (PROTONIX) IV  40 mg Intravenous Q12H  . polyethylene glycol  17 g Per Tube Daily  . sodium chloride flush  10-40 mL Intracatheter Q12H  . thiamine  100 mg Per Tube Daily  . valproic acid  250 mg Per Tube BID      Claudia Desanctis  01/30/2020, 8:08 AM

## 2020-02-14 NOTE — Treatment Plan (Addendum)
Corey Palmer is back from OR with partial closure. Is awake, has mittens on due to agitation. Spoke with Dr. Aundra Dubin will change impella from P4 to P3.  Continue to monitor.   20:47 Impella: P3 Flow 1.8 Current 205/146 Placement 110/73 Purge flow 8.7 Purge pressure 547 MAP 74  Addendum 21:56 RN notified that MAP is trending down (MAP 58) had to go up on vasopressin 0.03.  Impella P3, Flow 1.5-1.6.  Increased Impella back to P4, MAP now 72 w/ flow 2.1. Will keep at [facility] for now.

## 2020-02-14 NOTE — Interval H&P Note (Signed)
History and Physical Interval Note:  01/30/2020 10:53 AM  Corey Palmer  has presented today for surgery, with the diagnosis of stenal wound.  The various methods of treatment have been discussed with the patient and family. After consideration of risks, benefits and other options for treatment, the patient has consented to  Procedure(s) with comments: debridement of sternal wound (N/A) - Total 2.5 hours, please PECTORALIS FLAP (N/A) APPLICATION OF A-CELL (N/A) APPLICATION OF WOUND VAC (N/A) as a surgical intervention.  [name] history has been reviewed, patient examined, no change in status, stable for surgery.  I have reviewed the patient's chart and labs.  Questions were answered to the patient's satisfaction.     Loel Lofty Shell Blanchette

## 2020-02-15 ENCOUNTER — Encounter (HOSPITAL_COMMUNITY): Payer: Self-pay | Admitting: Plastic Surgery

## 2020-02-15 DIAGNOSIS — R570 Cardiogenic shock: Secondary | ICD-10-CM | POA: Diagnosis not present

## 2020-02-15 LAB — RENAL FUNCTION PANEL
Albumin: 1.7 g/dL — ABNORMAL LOW (ref 3.5–5.0)
Albumin: 1.8 g/dL — ABNORMAL LOW (ref 3.5–5.0)
Anion gap: 10 (ref 5–15)
Anion gap: 11 (ref 5–15)
BUN: 33 mg/dL — ABNORMAL HIGH (ref 6–20)
BUN: 32 mg/dL — ABNORMAL HIGH (ref 6–20)
CO2: 24 mmol/L (ref 22–32)
CO2: 21 mmol/L — ABNORMAL LOW (ref 22–32)
Calcium: 8 mg/dL — ABNORMAL LOW (ref 8.9–10.3)
Calcium: 8.3 mg/dL — ABNORMAL LOW (ref 8.9–10.3)
Chloride: 102 mmol/L (ref 98–111)
Chloride: 104 mmol/L (ref 98–111)
Creatinine, Ser: 1.24 mg/dL (ref 0.61–1.24)
Creatinine, Ser: 1.29 mg/dL — ABNORMAL HIGH (ref 0.61–1.24)
GFR, Estimated: >60 mL/min (ref >60)
GFR, Estimated: >60 mL/min (ref >60)
Glucose, Bld: 143 mg/dL — ABNORMAL HIGH (ref 70–99)
Glucose, Bld: 186 mg/dL — ABNORMAL HIGH (ref 70–99)
Phosphorus: 2.9 mg/dL (ref 2.5–4.6)
Phosphorus: 2.7 mg/dL (ref 2.5–4.6)
Potassium: 4.9 mmol/L (ref 3.5–5.1)
Potassium: 4 mmol/L (ref 3.5–5.1)
Sodium: 136 mmol/L (ref 135–145)
Sodium: 136 mmol/L (ref 135–145)

## 2020-02-15 LAB — COOXEMETRY PANEL
Carboxyhemoglobin: 1.5 % (ref 0.5–1.5)
Carboxyhemoglobin: 1.6 % — ABNORMAL HIGH (ref 0.5–1.5)
Carboxyhemoglobin: 1.8 % — ABNORMAL HIGH (ref 0.5–1.5)
Methemoglobin: 0.9 % (ref 0.0–1.5)
Methemoglobin: 0.9 % (ref 0.0–1.5)
Methemoglobin: 1 % (ref 0.0–1.5)
O2 Saturation: 50.4 %
O2 Saturation: 48.7 %
O2 Saturation: 59.4 %
Total hemoglobin: 8.2 g/dL — ABNORMAL LOW (ref 12.0–16.0)
Total hemoglobin: 7.4 g/dL — ABNORMAL LOW (ref 12.0–16.0)
Total hemoglobin: 7.6 g/dL — ABNORMAL LOW (ref 12.0–16.0)

## 2020-02-15 LAB — CBC WITH DIFFERENTIAL/PLATELET
Abs Immature Granulocytes: 0.44 10*3/uL — ABNORMAL HIGH (ref 0.00–0.07)
Basophils Absolute: 0.1 10*3/uL (ref 0.0–0.1)
Basophils Relative: 1 %
Eosinophils Absolute: 0.5 10*3/uL (ref 0.0–0.5)
Eosinophils Relative: 3 %
HCT: 26.4 % — ABNORMAL LOW (ref 39.0–52.0)
Hemoglobin: 8.3 g/dL — ABNORMAL LOW (ref 13.0–17.0)
Immature Granulocytes: 2 %
Lymphocytes Relative: 7 %
Lymphs Abs: 1.3 10*3/uL (ref 0.7–4.0)
MCH: 28.1 pg (ref 26.0–34.0)
MCHC: 31.4 g/dL (ref 30.0–36.0)
MCV: 89.5 fL (ref 80.0–100.0)
Monocytes Absolute: 2.1 10*3/uL — ABNORMAL HIGH (ref 0.1–1.0)
Monocytes Relative: 11 %
Neutro Abs: 14.4 10*3/uL — ABNORMAL HIGH (ref 1.7–7.7)
Neutrophils Relative %: 76 %
Platelets: 516 10*3/uL — ABNORMAL HIGH (ref 150–400)
RBC: 2.95 MIL/uL — ABNORMAL LOW (ref 4.22–5.81)
RDW: 20.8 % — ABNORMAL HIGH (ref 11.5–15.5)
WBC: 18.9 10*3/uL — ABNORMAL HIGH (ref 4.0–10.5)
nRBC: 0.3 % — ABNORMAL HIGH (ref 0.0–0.2)

## 2020-02-15 LAB — GLUCOSE, CAPILLARY
Glucose-Capillary: 138 mg/dL — ABNORMAL HIGH (ref 70–99)
Glucose-Capillary: 140 mg/dL — ABNORMAL HIGH (ref 70–99)
Glucose-Capillary: 142 mg/dL — ABNORMAL HIGH (ref 70–99)
Glucose-Capillary: 157 mg/dL — ABNORMAL HIGH (ref 70–99)
Glucose-Capillary: 160 mg/dL — ABNORMAL HIGH (ref 70–99)
Glucose-Capillary: 162 mg/dL — ABNORMAL HIGH (ref 70–99)
Glucose-Capillary: 138 mg/dL — ABNORMAL HIGH (ref 70–99)
Glucose-Capillary: 117 mg/dL — ABNORMAL HIGH (ref 70–99)
Glucose-Capillary: 129 mg/dL — ABNORMAL HIGH (ref 70–99)
Glucose-Capillary: 189 mg/dL — ABNORMAL HIGH (ref 70–99)
Glucose-Capillary: 181 mg/dL — ABNORMAL HIGH (ref 70–99)
Glucose-Capillary: 157 mg/dL — ABNORMAL HIGH (ref 70–99)
Glucose-Capillary: 182 mg/dL — ABNORMAL HIGH (ref 70–99)
Glucose-Capillary: 204 mg/dL — ABNORMAL HIGH (ref 70–99)

## 2020-02-15 LAB — HEPARIN LEVEL (UNFRACTIONATED): Heparin Unfractionated: <0.1 IU/mL — ABNORMAL LOW (ref 0.30–0.70)

## 2020-02-15 LAB — MAGNESIUM: Magnesium: 2.4 mg/dL (ref 1.7–2.4)

## 2020-02-15 LAB — LACTATE DEHYDROGENASE: LDH: 451 U/L — ABNORMAL HIGH (ref 98–192)

## 2020-02-15 LAB — LIDOCAINE LEVEL: Lidocaine Lvl: 4.3 ug/mL (ref 1.5–5.0)

## 2020-02-15 MED ORDER — DOBUTAMINE IN D5W 4-5 MG/ML-% IV SOLN: 2.0000 ug/kg/min | INTRAVENOUS | Status: DC

## 2020-02-15 MED ORDER — ALBUMIN HUMAN 5 % IV SOLN
12.5000 g | Freq: Once | INTRAVENOUS | Status: AC
  Administered 2020-02-15: 12.5 g via INTRAVENOUS
  Filled 2020-02-15: qty 250

## 2020-02-15 MED ORDER — JUVEN PO PACK
1.0000 | PACK | Freq: Two times a day (BID) | ORAL | Status: DC
  Administered 2020-02-15 – 2020-03-07 (×39): 1
  Filled 2020-02-15 (×38): qty 1

## 2020-02-15 MED ORDER — INSULIN DETEMIR 100 UNIT/ML ~~LOC~~ SOLN
18.0000 [IU] | Freq: Two times a day (BID) | SUBCUTANEOUS | Status: DC
  Administered 2020-02-15 – 2020-02-18 (×8): 18 [IU] via SUBCUTANEOUS
  Filled 2020-02-15 (×11): qty 0.18

## 2020-02-15 MED ORDER — INSULIN ASPART 100 UNIT/ML ~~LOC~~ SOLN
1.0000 [IU] | SUBCUTANEOUS | Status: DC
  Administered 2020-02-15: 2 [IU] via SUBCUTANEOUS
  Administered 2020-02-15: 1 [IU] via SUBCUTANEOUS
  Administered 2020-02-15: 2 [IU] via SUBCUTANEOUS
  Administered 2020-02-15: 3 [IU] via SUBCUTANEOUS
  Administered 2020-02-16: 2 [IU] via SUBCUTANEOUS
  Administered 2020-02-16: 3 [IU] via SUBCUTANEOUS
  Administered 2020-02-16 (×3): 2 [IU] via SUBCUTANEOUS
  Administered 2020-02-17: 3 [IU] via SUBCUTANEOUS
  Administered 2020-02-17: 2 [IU] via SUBCUTANEOUS
  Administered 2020-02-17: 1 [IU] via SUBCUTANEOUS
  Administered 2020-02-17 – 2020-02-18 (×5): 2 [IU] via SUBCUTANEOUS
  Administered 2020-02-19: 3 [IU] via SUBCUTANEOUS

## 2020-02-15 MED ORDER — DOBUTAMINE IN D5W 4-5 MG/ML-% IV SOLN
INTRAVENOUS | Status: AC
  Administered 2020-02-15: 2 ug/kg/min via INTRAVENOUS
  Filled 2020-02-15: qty 250

## 2020-02-15 MED ORDER — DOBUTAMINE IN D5W 4-5 MG/ML-% IV SOLN
5.0000 ug/kg/min | INTRAVENOUS | Status: DC
  Administered 2020-02-16: 4 ug/kg/min via INTRAVENOUS
  Administered 2020-02-18 – 2020-02-28 (×10): 5 ug/kg/min via INTRAVENOUS
  Filled 2020-02-15 (×11): qty 250

## 2020-02-15 MED ORDER — INSULIN ASPART 100 UNIT/ML ~~LOC~~ SOLN
6.0000 [IU] | SUBCUTANEOUS | Status: DC
  Administered 2020-02-15 – 2020-02-17 (×17): 6 [IU] via SUBCUTANEOUS

## 2020-02-15 MED ORDER — DEXTROSE 10 % IV SOLN: INTRAVENOUS | Status: DC | PRN

## 2020-02-15 MED ORDER — LORAZEPAM 2 MG/ML IJ SOLN
0.2500 mg | INTRAMUSCULAR | Status: DC
  Administered 2020-02-15 – 2020-02-20 (×25): 0.25 mg via INTRAVENOUS
  Filled 2020-02-15 (×28): qty 1

## 2020-02-15 NOTE — Progress Notes (Signed)
Patient is a 50 year old male who underwent partial closure of sternal chest wound, placement of ACell, placement of wound VAC, placement of ABRA device yesterday with Dr. Marla Roe.  Evaluated patient today at the bedside.  Discussed plan with his wife.  All questions were answered.  We will continue to follow.  Plan to advance abra straps tomorrow.

## 2020-02-15 NOTE — Progress Notes (Signed)
[facility] Progress Note    Assessment/ Plan:    22M with familial cardiomyopathy EF20% s/p redo CABG 01/77/93 complicated by persistent shock, multiple attempts at sternal closure with formation of chest hematoma and tamponade, Afib with RVR, and AKI requiring CRRT.    # Anuric AKI/CKD stage III- presumably due to ischemic ATN in setting of cardiogenic shock s/p CABG.  CRRT started 01/16/20.  All fluids 4K/2.5Ca.  Right femoral HD catheter placed 01/16/20 switched to [place] on 01/28/20.  - continue CRRT--> goal net neg 50-75cc/hr.   - Not ready for transition to iHD - replete phos PRN  - no change to CRRT settings today  # CAD s/p CABG complicated by cardiogenic shock and post-op hematoma. secondary closure on 12/23 then had to be re opened.  Went to the OR for washout on 12/28, bedside washout 02/01/19.  OR 02/19/2020 for closure, then had emergent reopening at the bedside 02/23/2020 d/t anterior chest hematoma again. OR for chest closure on 1/10.  Plastic surgery partial closure 1/17  # Cardiogenic shock- underlying familial cardiomyopathy s/p redo CABG, EF ~20%. Remains on pressors, Impella. Midodrine 20 tid  # ABLA- s/p postoperative bleeding into chest cavity s/p re-exploration for tamponade/hematoma. Received multiple blood products  # Atrial fibrillation - s/p DCCV on 12/16.  On amiodarone.   # VDRF- per CHF/PCCMsvc. Trach/peg 1/10  #-Leukocytosis/ new fever: per primary  # R loculated pleural effusion: for CT chest on 1/6 with contrast   #NSVT: per CV/EP  Subjective:      Partial sternal closure yesterday  On CRRT, 4K, 50-54m/h net neg  Impella, NE, VP  Hep in Impella; infreq clotting  No UOP  K 4.9 and P 2.9;   4.7/4.4 24h I/Os  R IJ Temp HD Cath      Objective:   BP (!) 102/59   Pulse 77   Temp 98.4 F (36.9 C) (Axillary)   Resp (!) 27   Ht '6\' 3"'  (1.905 m)   Wt 97.5 kg   SpO2 100%   BMI 26.87 kg/m   Intake/Output Summary (Last  24 hours) at 02/15/2020 0835 Last data filed at 02/15/2020 0800 Gross per 24 hour  Intake 4786.55 ml  Output 4464 ml  Net 322.55 ml   Weight change:   Physical Exam:  Gen: adult male nad CVS: normal sinus - RRR CHEST: woundvac foam in place, R [place] Impella Resp: clear but diminished anteriorly trach Abd: soft ND Ext:1-2+ edema hips and legs Neuro: awake and follows some simple commands ACCESS: R IJ nontunneled HD cath  Imaging: DG Abd 1 View  Result Date: 02/06/2020 CLINICAL DATA:  Abdominal pain EXAM: ABDOMEN - 1 VIEW COMPARISON:  X-ray abdomen 02/11/2020 FINDINGS: Skin staples overlie the mid abdomen. Right upper quadrant surgical clips. Left upper abdomen tube, likely percutaneous gastrostomy tube, overlying the left upper abdomen. Other subjacent tube likely representing a percutaneous jejunostomy tube. Possible other 2 of unclear etiology overlying the left lower quadrant. Gaseous distension of a couple loops of small bowel in the right abdomen. The bowel gas pattern is normal. No radio-opaque calculi or other significant radiographic abnormality are seen. IMPRESSION: Nonobstructive bowel gas pattern. Electronically Signed   By: MIven FinnM.D.   On: 02/17/2020 15:30    Labs: BMET Recent Labs  Lab 02/12/20 0318 02/12/20 1604 02/13/20 0327 02/13/20 1550 02/21/2020 0338 01/30/2020 1533 02/15/20 0254  NA 133* 133* 134* 132* 133* 135 136  K 4.5 4.7 4.5 4.3 4.3 4.7 4.9  CL 100 101 101 100 101 102 102  CO2 '23 23 24 22 22 24 24  ' GLUCOSE 126* 159* 214* 206* 121* 127* 143*  BUN 35* 36* 33* 34* 37* 31* 33*  CREATININE 1.41* 1.29* 1.34* 1.33* 1.30* 1.35* 1.24  CALCIUM 8.3* 7.9* 8.3* 8.0* 8.2* 7.9* 8.0*  PHOS 2.2* 2.5 1.9* 1.6* 2.0* 2.9 2.9   CBC Recent Labs  Lab 02/12/20 0318 02/13/20 0327 02/21/2020 0338 02/15/20 0254  WBC 16.6* 13.7* 15.4* 18.9*  NEUTROABS 12.1* 10.2* 11.9* 14.4*  HGB 9.5* 9.1* 9.1* 8.3*  HCT 29.3* 28.4* 29.6* 26.4*  MCV 88.8 88.2 88.6 89.5  PLT 445*  446* 440* 516*    Medications:    . vitamin C  500 mg Per Tube TID  . aspirin  324 mg Per Tube Daily  . atorvastatin  80 mg Per Tube Daily  . busPIRone  10 mg Per Tube TID  . chlorhexidine gluconate (MEDLINE KIT)  15 mL Mouth Rinse BID  . Chlorhexidine Gluconate Cloth  6 each Topical Daily  . darbepoetin (ARANESP) injection - NON-DIALYSIS  100 mcg Subcutaneous Q Sat-1800  . docusate  100 mg Per Tube BID  . feeding supplement (PROSource TF)  45 mL Per Tube TID  . fiber  1 packet Per Tube BID  . FLUoxetine  40 mg Per Tube BID  . HYDROmorphone  4 mg Oral Q6H  . mouth rinse  15 mL Mouth Rinse 10 times per day  . melatonin  5 mg Per Tube Daily  . mexiletine  150 mg Oral Q8H  . midodrine  20 mg Per Tube TID WC  . multivitamin  1 tablet Per Tube QHS  . pantoprazole (PROTONIX) IV  40 mg Intravenous Q12H  . polyethylene glycol  17 g Per Tube Daily  . sodium chloride flush  10-40 mL Intracatheter Q12H  . thiamine  100 mg Per Tube Daily  . valproic acid  250 mg Per Tube BID      Rexene Agent  02/15/2020, 8:35 AM

## 2020-02-15 NOTE — Progress Notes (Signed)
[facility] for Heparin Indication: Impella 5.5  Allergies  Allergen Reactions  . Orange Fruit Anaphylaxis, Hives and Other (See Comments)    Per Pt- Blisters around lips and Hives all over, also  . Penicillins Anaphylaxis    Did it involve swelling of the face/tongue/throat, SOB, or low BP? Yes Did it involve sudden or severe rash/hives, skin peeling, or any reaction on the inside of your mouth or nose? Yes Did you need to seek medical attention at a hospital or doctor's office? No When did it last happen?childhood If all above answers are "NO", may proceed with cephalosporin use.  [name] [Empagliflozin] Itching  . Basaglar Kwikpen [Insulin Glargine] Nausea And Vomiting    Patient Measurements: Height: 6\' 3"  (190.5 cm) Weight: 97.5 kg (214 lb 15.2 oz) IBW/kg (Calculated) : 84.5 Heparin dosing wt: 101 kg  Vital Signs: Temp: 98.4 F (36.9 C) (01/18 0400) Temp Source: Axillary (01/18 0400) Pulse Rate: 79 (01/18 0700)  Labs: Recent Labs    02/13/20 0327 02/13/20 1550 02/08/2020 0338 02/26/2020 1533 02/15/20 0254  HGB 9.1*  --  9.1*  --  8.3*  HCT 28.4*  --  29.6*  --  26.4*  PLT 446*  --  440*  --  516*  HEPARINUNFRC <0.10*  --  <0.10*  --  <0.10*  CREATININE 1.34*   < > 1.30* 1.35* 1.24   < > = values in this interval not displayed.    Estimated Creatinine Clearance: 84.2 mL/min (by C-G formula based on SCr of 1.24 mg/dL).   Assessment: 50 yo male s/p impella 5.5 placement. Pt started on heparinized purge and systemic heparin. Pt now s/p CABG 12/15 with Impella remaining in place c/b significant bleeding and hematoma requiring opened chest and reexploration in OR 12/16. Chest closed on 12/23, re-opened on 12/24. Underwent cardioversion in OR 12/28, s/p sternal wash out/application of wound VAC.   Underwent sternal washout, wound VAC change, followed by sternal closure on 1/5. Chest was re-opened on 1/5 given compromised  hemodynamics.   Heparin remains in purge only. Heparin level undetectable as expected, CBC stable. LDH stable at 451. Impella at P4, purge flow rate 8.7 (435 units/hr), pressures 500s. No s/sx of bleeding.    Goal of Therapy:  Monitor platelets by anticoagulation protocol: Yes   Plan:  Continue heparinized purge No systemic heparin for now F/u daily CBC, heparin level, for s/sx of bleeding F/u post [place], PharmD, [place] Pharmacist  Phone: [phone] 02/15/2020 7:43 AM  Please check AMION for all [place] phone numbers After 10:00 PM, call [place] [phone]

## 2020-02-15 NOTE — Progress Notes (Signed)
NAME:  Corey Palmer, MRN:  [id number], DOB:  02-20-1970, LOS: 6 ADMISSION DATE:  01/12/2020, CONSULTATION DATE: 01/22/2020 REFERRING MD: Aundra Dubin, CHIEF COMPLAINT: Respiratory failure post Impella  HPI/course in hospital  50 year old man admitted with A. fib/RVR, torsades, cardiogenic shock [EF 15] requiring Impella, CABG x5, AKI requiring initiation of CRRT  Past Medical History:    has a past medical history of Anginal pain ([place]), Anxiety, Arthritis, Automatic implantable cardioverter-defibrillator in situ (2007), Bipolar disorder ([place]), CAD (coronary artery disease), Cancer ([place]) (2013), CHF (congestive heart failure) ([place]), Chondrocalcinosis of right knee (06/15/8414), Chronic systolic heart failure ([place]), Depression, Diabetes uncomplicated adult-type II, Dilated cardiomyopathy ([place]), Dyslipidemia, Dysrhythmia, GERD (gastroesophageal reflux disease), Gout, unspecified, Hepatic steatosis (2011), History of kidney stones, echocardiogram, Hypertension, Obesity (BMI 30.0-34.9), Pacemaker, Paroxysmal atrial fibrillation ([place]), Peptic ulcer, Shortness of breath, and Sleep apnea.  Significant Hospital Events:  12/2 Afib with RVR. Cardioverted in ED. AICD did not fire. Milrinone for low co ox.  12/7 ICD fired, Torsades- likely from low K of 2.8.  12/8 Cath- multivessel disease w/ low CO. Volume overloaded.  12/11 Underwent Impella 5.5 insertion for persistent symptoms of forward failure with low cardiac indices. 12/12 Back to the OR for displacement of Impella. Extubated.  12/15 CABG x4, with LIMA-LAD, SVG-PDA/PLV, SVG-OM 12/16 Emergently opened chest at bedside for tamponade, clot compressing right atrium  12/16 To OR for Exploration of chest due to bleeding  12/19 Started CRRT 12/23 Sternal Closure in OR 12/24 Worsening shock.  Chest reopened at bedside with improvement in hemodynamics.  No mediastinal hematoma. 12/25 A flutter > rapid a paced to sinus rhythm.  Remains on high-dose pressors,  inotropes 12/28 Weaning vasopressor requirements.  For chest washout today.  Back in atrial fibrillation, refractory to increased amiodarone and attempted overdrive pacing.  DCCV to NSR. Tolerating fluid removal via CRRT. 1/3: Appears euvolemic.  Adjusting glycemic control.  Chest still open so not weaning 1/4, spiking fever, Vanco meropenem resumed after culture sent from blood and sputum. 1/5 awaiting return to the OR for wound VAC dressing assessment fever curve and white blood cell count improving after antibiotics resumed the day prior, did have chest closed, developed tamponade physiology shortly after had to go back for emergent reopening of chest, return to intensive care and refractory shock 1/6: Wound VAC dressing replaced.  Still in shock.  Starting to wean sedation 1/7 >> : Still on high-dose pressors, CRRT 1/15 recurrent Vtach, shocked 1/18>> Attempted pressure change to P 3 with addition of Dobutamine . MAP goal > 60  Consults:  PCCM, nephrology, cardiology, cardiothoracic surgery, EP  Procedures:  12/2 R PICC >> 12/11 right ax impella 5.5 >> 12/15 ETT >> 12/15 right femoral arterial sheath >> 12/19 right femoral HD >> 12/23 chest tube-right pleural, left pleural , mediastinal  >> 1/10  Trach->1/11 1/10 PEG->1/11  Significant Diagnostic Tests:    Micro Data:  See micro tab  Antimicrobials:  See fever tab  Interval history:  No events overnight Weaning on PS 15/5 with good sats, volumes and managing WOB. Remains  On Levo at 34, Dobutamine at 4 mcg/kg/min , Precedex at 0.2 mcg/kg, Amio at 30 mg Lidocaine off with no recurrent a fib On CRRT/pressors.( Net - 45 L )>> Keeping even 120 from CT last 24 Co ox 1/18  am 48% on P 4 Impella Started Dobutamine to see if enables Empella pressure wean to P3 Repeat Co-ox due at 11 am Creatinine 1.24 Sputum looks dark tan Slight bump in  WBC to 18.9, ? Reactive post op T max 98.4 LDH 451 ( Slight bump from 409) CBG's with 2  readings above 150 No new CXR Platelets 516   Objective   Blood pressure (!) 88/64, pulse 84, temperature 98.4 F (36.9 C), temperature source Axillary, resp. rate 20, height 6\' 3"  (1.905 m), weight 97.5 kg, SpO2 98 %. CVP:  [7 mmHg-10 mmHg] 8 mmHg  Vent Mode: PSV;CPAP FiO2 (%):  [40 %-50 %] 40 % Set Rate:  [28 bmp] 28 bmp Vt Set:  [510 mL] 510 mL PEEP:  [5 cmH20] 5 cmH20 Pressure Support:  [15 cmH20] 15 cmH20 Plateau Pressure:  [21 cmH20-26 cmH20] 24 cmH20   Intake/Output Summary (Last 24 hours) at 02/15/2020 1114 Last data filed at 02/15/2020 1000 Gross per 24 hour  Intake 4226.35 ml  Output 4279 ml  Net -52.65 ml   Filed Weights   02/12/20 0500 02/13/20 0500 02/15/20 0437  Weight: 98.3 kg 98 kg 97.5 kg   CVP:  [7 mmHg-10 mmHg] 8 mmHg  Examination: Constitutional: ill appearing man lying in bed , follows commands, sedated Eyes: EOMI, pupils equal Ears, nose, mouth, and throat: trach in place, skin breakdown per trach, minimal secretions tan, thick Cardiovascular: S1, S2, distant, vac in place, ext warm, chest open with tension sutures intact Respiratory: Bilateral chest excursion, scattered rhonci, tolerating CPAP/ PS Gastrointestinal: soft, mild TTP, +BS, rectal tube in place, ND Skin: No rashes, No lesions, normal turgor, open chest Neurologic: moves all 4 ext to command Psychiatric: RASS 0       Assessment & Plan:   Acute hypoxic and hypercapnic respiratory failure requiring mechanical ventilation following cardiac surgery, post tracheostomy, improved, biggest issues are deconditioning and continued shock Persistent small right sided loculated pleural effusions- doubt contributing to resp failure, further management per TCTS Recurrrent Vtach- plans are now to transition to mexilitine per CHF Cardiogenic shock s/p high risk CABG complicated by tamponade requiring open chest now post omental flap closure, vac on impella wean, eventual plans for muscle flap closure  thought to be today, unclear if concurrent impella removal, per TCTS and CHF teams Vasoplegia- from prolonged critical illness on midodrine, midodrine to be decreased by CHF team Dysphagia- s/p GJ tube Cardiorenal syndrome- now on CRRT Prolonged QT- complicates PAD control Persistent agitation/anxiety/pain- remains on dilaudid gtt Profound muscular deconditioning postop due to prolonged critical illness Afib PTA  - To OR 1/17 for muscle flap closure with TCTS/plastics, okay from pulm  standpoint to try PS trials if ectomy controlled - AC per TCTS and cardiology - Continue klonipin, start PO dilaudid, try to wean dilaudid drip - Inotrope, pressor, and impella wean per TCTS and CHF teams - CRRT per nephrology and CHF teams - Antiarrhythmics per EP team - Guarded prognosis  If WBC continues to rise or develops fever, consider re culturing sputum/ blood. Trach foam to breakdown, and loosen the trach toes, monitor for improvement.  Daily Goals Checklist  Pain/Anxiety/Delirium protocol (if indicated): dilaudid gtt, weaning as able VAP protocol (if indicated): bundle in place. DVT prophylaxis:  Full dose asa Nutrition Status: TF GI prophylaxis: pantoprazole Glucose control: insulin gtt Mobility/therapy needs: bed rest  Code Status: full  Family Communication: per primary Disposition: ICU   Patient critically ill due to persistent shock and respiratory failure Interventions to address this today vent wean, pressor titration Risk of deterioration without these interventions is high  CC APP time : 30 minutes Magdalen Spatz, MSN, AGACNP-BC [place] See  Amion for personal pager 02/15/2020 11:14 AM

## 2020-02-15 NOTE — Progress Notes (Signed)
Nutrition Follow-up  DOCUMENTATION CODES:   Not applicable  INTERVENTION:   Tube Feeding via J-tube:  -Pivot 1.5to2ml/hrvia Cortrak -Pro-Source TF45 mlTID [phone] of protein, 2640kcals and 1249mL of free water  Continue Rena-Vite daily  Add Juven BID, each packet provides 80 calories, 8 grams of carbohydrate, 2.5  grams of protein (collagen), 7 grams of L-arginine and 7 grams of L-glutamine; supplement contains CaHMB, Vitamins C, E, B12 and Zinc to promote wound healing   NUTRITION DIAGNOSIS:   Inadequate oral intake related to acute illness as evidenced by NPO status.  Being addressed via TF   GOAL:   Patient will meet greater than or equal to 90% of their needs  Progressing  MONITOR:   Weight trends,Vent status  REASON FOR ASSESSMENT:   Ventilator    ASSESSMENT:   50 yo male admitted with Afib with RVR requiring cardioversion, ischemic CM requiring inotropes and impella support and ultimately underwent CABG x 4 on 12/15 with Impella still in place. PMH includes CAD, benign stomach cancer, CHF, DM, depression, CM, GERD, HTN  12/11 Impella LVAD placed 12/15 Redo Sternotomy, CABG on pump x 4, Intubated 12/16 Bedside chest exploration for tamponade with moderate amount of anterior mediastinal clot removed, OR for bleeding s/p CABG for mediastinal washout 12/17 Cortrak placed-duodenal  12/19 CRRT initiated 12/23 Sternal closure  12/24 Worsening shock, Chest reopened at bedside 12/28 OR-[place], Wound VAC 01/04 Bedside washout 01/05 OR for sternal washout and closure, reopened later due to compromised hemodynamics due to mediastinal hematoma, wound VAC replaced 01/10 Sternal washout with wound VAC change, greater omental flap closure of sternum, TRACH, Open G-tube, J-tube 1/17 OR muscle flap partial sternal closure by TCTS/plastics, placement of VAC spone and ABRA device  Pt remains on vent support via trach and CRRT (x 30 days). Remains Impella  and pressor dependent; on levophed and dobutamine, vasopressin off  Pivot 1.5 at 70 ml/hr, Pro-source 45 mL TID via J-tube  Noted newly documented wounds since last assessment  Current wt 97.5 kg. Net negative 7 L. Pt with fluid weight loss and suspected true weight loss.   Labs: reviewed Meds: Vit C 500 mg TID, Renal MVI, zinc sulfate, aranesp, ss novolog, novolog q 4 hours, levemir, thiamine  Diet Order:   Diet Order    None      EDUCATION NEEDS:   Not appropriate for education at this time  Skin:  Skin Assessment: Skin Integrity Issues: Skin Integrity Issues:: Unstageable,Stage II Stage II: buttocks, throat Unstageable: throat Incisions: stermun, chest tubes x 4 Other: venous stasis ulcers  Last BM:  1/18 rectal tube  Height:   Ht Readings from Last 1 Encounters:  01/09/2020 6\' 3"  (1.905 m)    Weight:   Wt Readings from Last 1 Encounters:  02/15/20 97.5 kg    Ideal Body Weight:  89 kg  BMI:  Body mass index is 26.87 kg/m.  Estimated Nutritional Needs:   Kcal:  2500-2800 kcals  Protein:  180-200 grams  Fluid:  >/= 1.5 L   Kerman Passey MS, RDN, LDN, CNSC Registered Dietitian III Clinical Nutrition RD Pager and On-Call Pager Number Located in [place]

## 2020-02-15 NOTE — Progress Notes (Signed)
Dr Aundra Dubin notified of patient having persistent hypotension with the drop in P-level to P3. Received orders to increase Dobutamine to 4 mcg/kg/min with a MAP goal above 60. Repeat Coox at 1100 and if Coox remains low to page regarding increasing the P-level.

## 2020-02-15 NOTE — Progress Notes (Addendum)
TCTS BRIEF SICU PROGRESS NOTE  1 Day Post-Op  S/P Procedure(s) (LRB): STERNAL WOUND DEBRIDEMENT WITH PARTIAL CLOSURE OF STERNAL WOUND (N/A) APPLICATION OF A-CELL (N/A) APPLICATION OF WOUND VAC (N/A)   Essentially stable day NSR w/out any significant runs VT Now on dobutamine off vasopressin, still on high dose levophed Impella flows 1.7-1.9 on P3, MAP trending down 55-60  Plan: Continue current plan  Rexene Alberts, MD 02/15/2020 5:30 PM

## 2020-02-15 NOTE — Progress Notes (Addendum)
Telemetry is reviewed with Dr. Curt Bears. No VT On mexiletine Transition to PO when able, perhaps after Impella wean/extubation  Will defer ongoing management with primary teams.  EP service remains available, please recall.  We will watch from [place], pending the patient's clinical course, recovery, prior to discharge, may need to revisit VT zones rates/programming.  Tommye Standard, PA-C

## 2020-02-15 NOTE — Progress Notes (Addendum)
*  1108  This chaplain joined PMT NP-ML outside the Pt. room for a RN update.    The chaplain later phoned the Pt. wife-[name] for a spiritual care F/U.  The chaplain understands [name] just arrived and is bedside at this time.  The chaplain will F/U with [name] before 3pm today.  *1345  The chaplain is present with [name] in the Pt. room listening to the family's story of hope throughout the Pt. current hospitalization.  [name] speaks of a faith filled hope and how hope has the ability to connect the Pt. community of family and friends. The chaplain understands the Pt. is hopeful he will be able to continue his relationship with his 69yr old grandson.  During the chaplain's time with [name] today, [name] affirmed the ongoing relationships with the Pt. RNs and is appreciative of the communication from the Pt. MDs.  [name] accepted the chaplain's invitation for prayer and F/U spiritual care.

## 2020-02-15 NOTE — Progress Notes (Signed)
Patient sutures removed from trach site per MD order.  Upon removal of sutures and cleaning of trach site, RT noticed that patient has a small sore on right side next to central line dressing and RT also noted that under patient's flange of trach it was red and sore developed.  RN made aware.  Foam dressing placed over both along with gauze dressing.  Will continue to monitor.

## 2020-02-15 NOTE — Progress Notes (Addendum)
Patient ID: Corey Palmer, male   DOB: 1970/11/18, 50 y.o.   MRN: [id number]     Advanced Heart Failure Rounding Note  PCP-Cardiologist: No primary care provider on file.   Subjective:    - 12/7 Torsades -ICD shock x1.  - 12/11 Impella 5.5 placed.  - 12/12 Impella repositioned in OR - 12/15 CABG x 4 with LIMA-LAD, SVG-PDA/PLV, SVG-OM - 12/16 DCCV afib.  Back to OR to re-open chest and again to evacuate hematoma.  - 12/19 CVVH initiated  - 12/23 Chest closed - 12/24 Chest reopened - 12/26 RAP back into NSR - 12/27 back in atrial fibrillation, unable to rapid atrial pace out.   - 12/28 To OR, chest partially closed and wound vac placed.  DCCV to NSR.  - 12/31 Antibiotics stopped. Swan removed, HD catheter moved from right femoral to right IJ.  - 1/5 To OR for chest closure. Chest reopened emergently at bedside again to evacuate hematoma  - 1/10 To OR: omental flap closure of chest with wound vac, tracheostomy, G-tube, J-tube.    - 1/12 Plastics and EP consulted.  - 1/13 Palliative Care consulted.  - 1/15 Went into VT, Shock 200 J and amiodarone bolus 150.   - 1/16: Milrinone stopped.    - 1/17: Partial closure of chest in OR  On NE 27 + VP 0.01 + midodrine 30 tid + mexiletine 5 + 30 amio drip  Impella P-4. Co-ox 50%. CVP 7 .  Overnight tried to drop down from P4 to P3 which resulted in having to increase pressors due to low MAP and also flows on impella.   Remains on CVVH pulling 50 mL/H   Impella 5.5 P-4 Flow 1.8 LDH 383=>410 =>412 => 437=> 403 => 409=> 451=>451   Objective:   Weight Range: 98 kg Body mass index is 27 kg/m.   Vital Signs:   Temp:  [98 F (36.7 C)-98.4 F (36.9 C)] 98.4 F (36.9 C) (01/18 0400) Pulse Rate:  [63-93] 79 (01/18 0400) Resp:  [10-32] 30 (01/18 0400) BP: (101-105)/(61-62) 102/62 (01/17 1536) SpO2:  [91 %-100 %] 100 % (01/18 0400) Arterial Line BP: (86-113)/(50-69) 104/60 (01/18 0400) FiO2 (%):  [40 %-50 %] 40 % (01/18 0400) Last BM Date:  02/25/2020  Weight change: Filed Weights   02/11/20 0500 02/12/20 0500 02/13/20 0500  Weight: 98.9 kg 98.3 kg 98 kg    Intake/Output:   Intake/Output Summary (Last 24 hours) at 02/15/2020 0435 Last data filed at 02/15/2020 0400 Gross per 24 hour  Intake 4592.6 ml  Output 4222 ml  Net 370.6 ml      Physical Exam   CVP 7 General:  Chronic ill appearing. . No resp difficulty HEENT: trach Neck: supple. No JVD.  Cor: Regular rate & rhythm. No rubs, gallops or murmurs. Lungs: Coarse breath sounds.  Abdomen: soft, nontender, + distended.  Extremities: no cyanosis, clubbing, rash, posterior dependent edema Neuro: alert, sleeping  Telemetry   SR  Rates in the 70s.  Personally reviewed.   Labs    CBC Recent Labs    02/21/2020 0338 02/15/20 0254  WBC 15.4* 18.9*  NEUTROABS 11.9* 14.4*  HGB 9.1* 8.3*  HCT 29.6* 26.4*  MCV 88.6 89.5  PLT 440* 500*   Basic Metabolic Panel Recent Labs    02/23/2020 0338 02/06/2020 1533 02/15/20 0254  NA 133* 135 136  K 4.3 4.7 4.9  CL 101 102 102  CO2 '22 24 24  ' GLUCOSE 121* 127* 143*  BUN 37*  31* 33*  CREATININE 1.30* 1.35* 1.24  CALCIUM 8.2* 7.9* 8.0*  MG 2.2  --  2.4  PHOS 2.0* 2.9 2.9   Liver Function Tests Recent Labs    02/09/2020 1533 02/15/20 0254  ALBUMIN 1.7* 1.7*   No results for input(s): LIPASE, AMYLASE in the last 72 hours. Cardiac Enzymes No results for input(s): CKTOTAL, CKMB, CKMBINDEX, TROPONINI in the last 72 hours.  BNP: BNP (last 3 results) Recent Labs    01/07/2020 1619  BNP 577.5*    ProBNP (last 3 results) No results for input(s): PROBNP in the last 8760 hours.   D-Dimer No results for input(s): DDIMER in the last 72 hours. Hemoglobin A1C No results for input(s): HGBA1C in the last 72 hours. Fasting Lipid Panel No results for input(s): CHOL, HDL, LDLCALC, TRIG, CHOLHDL, LDLDIRECT in the last 72 hours. Thyroid Function Tests No results for input(s): TSH, T4TOTAL, T3FREE, THYROIDAB in the last 72  hours.  Invalid input(s): FREET3  Other results:   Imaging    DG Abd 1 View  Result Date: 02/01/2020 CLINICAL DATA:  Abdominal pain EXAM: ABDOMEN - 1 VIEW COMPARISON:  X-ray abdomen 02/11/2020 FINDINGS: Skin staples overlie the mid abdomen. Right upper quadrant surgical clips. Left upper abdomen tube, likely percutaneous gastrostomy tube, overlying the left upper abdomen. Other subjacent tube likely representing a percutaneous jejunostomy tube. Possible other 2 of unclear etiology overlying the left lower quadrant. Gaseous distension of a couple loops of small bowel in the right abdomen. The bowel gas pattern is normal. No radio-opaque calculi or other significant radiographic abnormality are seen. IMPRESSION: Nonobstructive bowel gas pattern. Electronically Signed   By: Iven Finn M.D.   On: 02/11/2020 15:30     Medications:     Scheduled Medications: . vitamin C  500 mg Per Tube TID  . aspirin  324 mg Per Tube Daily  . atorvastatin  80 mg Per Tube Daily  . busPIRone  10 mg Per Tube TID  . chlorhexidine gluconate (MEDLINE KIT)  15 mL Mouth Rinse BID  . Chlorhexidine Gluconate Cloth  6 each Topical Daily  . darbepoetin (ARANESP) injection - NON-DIALYSIS  100 mcg Subcutaneous Q Sat-1800  . docusate  100 mg Per Tube BID  . feeding supplement (PROSource TF)  45 mL Per Tube TID  . fiber  1 packet Per Tube BID  . FLUoxetine  40 mg Per Tube BID  . HYDROmorphone  4 mg Oral Q6H  . mouth rinse  15 mL Mouth Rinse 10 times per day  . melatonin  5 mg Per Tube Daily  . mexiletine  150 mg Oral Q8H  . midodrine  20 mg Per Tube TID WC  . multivitamin  1 tablet Per Tube QHS  . pantoprazole (PROTONIX) IV  40 mg Intravenous Q12H  . polyethylene glycol  17 g Per Tube Daily  . sodium chloride flush  10-40 mL Intracatheter Q12H  . thiamine  100 mg Per Tube Daily  . valproic acid  250 mg Per Tube BID    Infusions: .  prismasol BGK 4/2.5 500 mL/hr at 02/15/20 0302  .  prismasol BGK 4/2.5  200 mL/hr at 02/20/2020 1453  . sodium chloride Stopped (01/29/20 0955)  . sodium chloride    . amiodarone 30 mg/hr (02/15/20 0400)  . dexmedetomidine (PRECEDEX) IV infusion 1 mcg/kg/hr (02/15/20 0400)  . electrolyte-A Stopped (02/03/20 9753)  . epinephrine Stopped (02/08/20 0651)  . feeding supplement (PIVOT 1.5 CAL) 70 mL/hr at 02/17/2020 1628  .  impella catheter heparin 50 unit/mL in dextrose 5%    . HYDROmorphone 4 mg/hr (02/15/20 0400)  . insulin 2.2 mL/hr at 02/15/20 0400  . lactated ringers 10 mL/hr at 02/15/20 0400  . norepinephrine (LEVOPHED) Adult infusion 25 mcg/min (02/15/20 0400)  . prismasol BGK 4/2.5 1,500 mL/hr at 02/15/20 0301  . vasopressin 0.02 Units/min (02/15/20 0400)    PRN Medications: sodium chloride, Place/Maintain arterial line **AND** sodium chloride, artificial tears, bisacodyl **OR** bisacodyl, docusate, heparin, heparin, HYDROmorphone, lip balm, metoprolol tartrate, midazolam, polyethylene glycol, sodium chloride flush     Assessment/Plan   1. Cardiogenic shock/acute on chronic systolic CHF: Initially nonischemic cardiomyopathy.  Possible familial cardiomyopathy as both parents had cardiomyopathy and died at around 1. However, [employer] gene testing did not show any common mutation for cardiomyopathy.  However, this admission noted to have severe 3 vessel disease so suspect component of ischemic cardiomyopathy.  St Jude ICD.  Echo in 8/20 with EF 15% and mildly decreased RV function. Echo this admission with EF < 20%, moderate LV dilation, RV mildly reduced, severe LAE, no significant MR. Low output HF with markedly low EF.  He has a long history of cardiomyopathy (20 yrs), tends to minimize symptoms.  Cardiorenal syndrome with creatinine up to 2.2,  stabilized on milrinone and Impella 5.5. SCr 1.44 day of CABG. CABG 12/15.  Post-op shock, back to OR 12/16 to open chest (chest wall compartment syndrome) and again to evacuate hematoma.  TEE post-op with severe RV  dysfunction.  CVVHD initiated 12/19 for volume removal in setting of rising Scr/decreased UOP. Suspect ATN. S/p chest closure 12/23. Hemodynamics much worse on 12/24 with rising pressor demands. Co-ox 36%.  Bedside echo severe biventricular dysfunction with marked septal bounce. Chest re-opened 12/24.  Was atrial paced back to NSR on 12/26 with improvement in hemodynamics but back in atrial fibrillation on 12/27 and unable to pace out, DCCV to NSR on 12/28. Went back to OR 1/5 for chest closure but several hours later required emergent re-opening of chest at bedside to evacuate hematoma. To OR again 1/10 with closure of chest with omental flap with G & J tube.  1/17 partial closure.  - Remains on VP 0.01 + Norepi 22. Impella P-4 with co-ox 50% - Continue CVVH.  - Continue to wean impella & NE as tolerated (Goal MAP 70-90).  2. Atrial fibrillation: H/o PAF.  He was on dofetilide in the remote past but this was stopped due to noncompliance.  He had an upper GI bleed from antral ulcers in 3/12. He was seen by GI and was deemed safe to restart anticoagulation as long as he remains on a PPI.  No apparent recurrence of AF until just prior to this admission, was cardioverted back to NSR in ER but went back into atrial fibrillation.  Post-op afib, DCCV to NSR/BiV pacing am 12/16 -> back to AF. Rapid atrial pacing back to SR  - SR on tele - C/w amiodarone 30 mg/hr.   - Getting heparin through impella purge 3. CAD:   Cath this admission with severe 3 vessel disease. CABG x 4 on 12/15.  - Continue atorvastatin, ASA.  4. Acute on chronic hypoxemic respiratory failure: Vent per CCM.  Has tracheostomy.  - CCM following.  5. AKI on CKD stage 3: Likely ATN/cardiorenal. Anuric. On CVVH.    - Pulling 50 mL/H 6. Diabetes: Insulin.  7. Hyponatremia: Resolved with CVVH.  8. Torsades/ICD shock: 12/7/212 in hospital event, in setting of severe hypokalemia and  hypomagnesemia.   9. Gout: h/o gout. Complained of rt knee pain  c/w previous flares - treated w/ prednisone burst (completed).  10: ID: completed abx for PNA.  Afebrile.  Cultures NGTD.   11. FEN: On TFS s via J-tube. 12. Anemia: Stable 8.3 today.  Transfuse < 7.5.  13. Stage II Pressure Ulcer- sacrum -Continue to reposition every 2 hours R/L  14. Pleural Effusion  - loculated rt apical pleural effusion noted on imaging.  - continue UF through CVVH  - PCCM following  15. VT - RUN of NSVT required amio 150 bolus, followed by shock 200J.   - EP consulted, started mexitil.   - keep K > 4.0 and Mg > 2.0 - wean pressors as tolerated  16. Abd pain - G-J tube flushed at bedside  - CT C/A/P showed no source for the abdominal pain - TFs ongoing.    Carlene Coria, NP 02/15/2020 4:35 AM   Patient seen with NP, agree with the above note.    VT quiescent over the last day on amiodarone gtt + mexiletine gtt.    Stable night, remains on NE 27 + vasopressin 0.01.  Early am co-ox 50%.  CVP 7, on CVVH with UF net 50 cc/hr.   Impella P4 currently, turned up from P3 last night.    General: NAD Neck: No JVD, no thyromegaly or thyroid nodule.  Lungs: Decreased at bases.  CV: Open chest.  Heart regular S1/S2, no S3/S4, no murmur.  Trace ankle edema.    Abdomen: Soft, nontender, no hepatosplenomegaly, no distention.  Skin: Intact without lesions or rashes.  Neurologic: Will wake up, follow commands.  Extremities: No clubbing or cyanosis.  HEENT: Normal.   At this point, will try to focus on weaning Impella.  He is not a candidate for LVAD or transplant at this point with RRT, so if we cannot wean and remove Impella there is really no chance for recovery. - Start dobutamine 2 mcg/kg/min to see if this aids weaning of Impella. - Repeat co-ox this morning.   - Decrease Impella to P3 again today and follow. Getting heparin via purge only, LDH stable at 451.  - Continue slow pressor wean, vasopressin down to 0.01 today.  Can stop if MAP remains stable.  Continue NE for now.  - Continue gentle UF via CVVH, even to -50 cc/hr net. CVP is 7.   VT quiescent, continue amiodarone gtt and mexiletine 150 mg q8 hrs.   TFs ongoing.   CRITICAL CARE Performed by: Loralie Champagne  Total critical care time: 40 minutes  Critical care time was exclusive of separately billable procedures and treating other patients.  Critical care was necessary to treat or prevent imminent or life-threatening deterioration.  Critical care was time spent personally by me on the following activities: development of treatment plan with patient and/or surrogate as well as nursing, discussions with consultants, evaluation of patient's response to treatment, examination of patient, obtaining history from patient or surrogate, ordering and performing treatments and interventions, ordering and review of laboratory studies, ordering and review of radiographic studies, pulse oximetry and re-evaluation of patient's condition.  Loralie Champagne 02/15/2020 8:40 AM

## 2020-02-15 NOTE — Progress Notes (Signed)
1 Day Post-Op Procedure(s) (LRB): STERNAL WOUND DEBRIDEMENT WITH PARTIAL CLOSURE OF STERNAL WOUND (N/A) APPLICATION OF A-CELL (N/A) APPLICATION OF WOUND VAC (N/A) Subjective: Unable to assess  Objective: Vital signs in last 24 hours: Temp:  [98 F (36.7 C)-98.4 F (36.9 C)] 98.4 F (36.9 C) (01/18 0400) Pulse Rate:  [61-93] 79 (01/18 0700) Cardiac Rhythm: Normal sinus rhythm (01/18 0400) Resp:  [10-41] 41 (01/18 0700) BP: (101-105)/(61-62) 102/62 (01/17 1536) SpO2:  [91 %-100 %] 100 % (01/18 0700) Arterial Line BP: (86-117)/(50-69) 94/57 (01/18 0700) FiO2 (%):  [40 %-50 %] 40 % (01/18 0400) Weight:  [97.5 kg] 97.5 kg (01/18 0437)  Hemodynamic parameters for last 24 hours: CVP:  [7 mmHg-10 mmHg] 7 mmHg  Intake/Output from previous day: 01/17 0701 - 01/18 0700 In: 4735.8 [I.V.:3179.4; NG/GT:1066.3; IV Piggyback:300] Out: 2111 [Urine:10; Drains:450; Stool:200; Blood:100; Chest Tube:120] Intake/Output this shift: No intake/output data recorded.  General appearance: alert Neurologic: intact Heart: regular rate and rhythm, S1, S2 normal, no murmur, click, rub or gallop Lungs: clear to auscultation bilaterally Abdomen: mildly tender Extremities: edema 1+ Wound: dressed intact  Lab Results: Recent Labs    02/15/2020 0338 02/15/20 0254  WBC 15.4* 18.9*  HGB 9.1* 8.3*  HCT 29.6* 26.4*  PLT 440* 516*   BMET:  Recent Labs    02/08/2020 1533 02/15/20 0254  NA 135 136  K 4.7 4.9  CL 102 102  CO2 24 24  GLUCOSE 127* 143*  BUN 31* 33*  CREATININE 1.35* 1.24  CALCIUM 7.9* 8.0*    PT/INR: No results for input(s): LABPROT, INR in the last 72 hours. ABG    Component Value Date/Time   PHART 7.417 02/09/2020 1513   HCO3 25.2 02/09/2020 1513   TCO2 26 02/09/2020 1513   ACIDBASEDEF 1.0 02/08/2020 0101   O2SAT 50.4 02/15/2020 0254   CBG (last 3)  Recent Labs    02/15/20 0456 02/15/20 0616 02/15/20 0643  GLUCAP 138* 117* 129*    Assessment/Plan: S/P Procedure(s)  (LRB): STERNAL WOUND DEBRIDEMENT WITH PARTIAL CLOSURE OF STERNAL WOUND (N/A) APPLICATION OF A-CELL (N/A) APPLICATION OF WOUND VAC (N/A) Mobilize Diuresis slow vent wean   LOS: 47 days    [name] 02/15/2020

## 2020-02-16 ENCOUNTER — Inpatient Hospital Stay (HOSPITAL_COMMUNITY): Payer: BC Managed Care – PPO

## 2020-02-16 DIAGNOSIS — J9601 Acute respiratory failure with hypoxia: Secondary | ICD-10-CM | POA: Diagnosis not present

## 2020-02-16 DIAGNOSIS — R570 Cardiogenic shock: Secondary | ICD-10-CM | POA: Diagnosis not present

## 2020-02-16 LAB — CBC WITH DIFFERENTIAL/PLATELET
Abs Immature Granulocytes: 0.6 10*3/uL — ABNORMAL HIGH (ref 0.00–0.07)
Basophils Absolute: 0.1 10*3/uL (ref 0.0–0.1)
Basophils Relative: 1 %
Eosinophils Absolute: 0.3 10*3/uL (ref 0.0–0.5)
Eosinophils Relative: 2 %
HCT: 24.3 % — ABNORMAL LOW (ref 39.0–52.0)
Hemoglobin: 7.5 g/dL — ABNORMAL LOW (ref 13.0–17.0)
Immature Granulocytes: 3 %
Lymphocytes Relative: 5 %
Lymphs Abs: 0.9 10*3/uL (ref 0.7–4.0)
MCH: 27.5 pg (ref 26.0–34.0)
MCHC: 30.9 g/dL (ref 30.0–36.0)
MCV: 89 fL (ref 80.0–100.0)
Monocytes Absolute: 1.8 10*3/uL — ABNORMAL HIGH (ref 0.1–1.0)
Monocytes Relative: 10 %
Neutro Abs: 14.5 10*3/uL — ABNORMAL HIGH (ref 1.7–7.7)
Neutrophils Relative %: 79 %
Platelets: 447 10*3/uL — ABNORMAL HIGH (ref 150–400)
RBC: 2.73 MIL/uL — ABNORMAL LOW (ref 4.22–5.81)
RDW: 21.3 % — ABNORMAL HIGH (ref 11.5–15.5)
WBC: 18.2 10*3/uL — ABNORMAL HIGH (ref 4.0–10.5)
nRBC: 0.3 % — ABNORMAL HIGH (ref 0.0–0.2)

## 2020-02-16 LAB — COMPREHENSIVE METABOLIC PANEL
ALT: 51 U/L — ABNORMAL HIGH (ref 0–44)
AST: 57 U/L — ABNORMAL HIGH (ref 15–41)
Albumin: 1.8 g/dL — ABNORMAL LOW (ref 3.5–5.0)
Alkaline Phosphatase: 261 U/L — ABNORMAL HIGH (ref 38–126)
Anion gap: 9 (ref 5–15)
BUN: 35 mg/dL — ABNORMAL HIGH (ref 6–20)
CO2: 24 mmol/L (ref 22–32)
Calcium: 8.2 mg/dL — ABNORMAL LOW (ref 8.9–10.3)
Chloride: 101 mmol/L (ref 98–111)
Creatinine, Ser: 1.24 mg/dL (ref 0.61–1.24)
GFR, Estimated: >60 mL/min (ref >60)
Glucose, Bld: 170 mg/dL — ABNORMAL HIGH (ref 70–99)
Potassium: 4.5 mmol/L (ref 3.5–5.1)
Sodium: 134 mmol/L — ABNORMAL LOW (ref 135–145)
Total Bilirubin: 1.2 mg/dL (ref 0.3–1.2)
Total Protein: 5.8 g/dL — ABNORMAL LOW (ref 6.5–8.1)

## 2020-02-16 LAB — GLUCOSE, CAPILLARY
Glucose-Capillary: 164 mg/dL — ABNORMAL HIGH (ref 70–99)
Glucose-Capillary: 164 mg/dL — ABNORMAL HIGH (ref 70–99)
Glucose-Capillary: 202 mg/dL — ABNORMAL HIGH (ref 70–99)
Glucose-Capillary: 166 mg/dL — ABNORMAL HIGH (ref 70–99)
Glucose-Capillary: 104 mg/dL — ABNORMAL HIGH (ref 70–99)
Glucose-Capillary: 190 mg/dL — ABNORMAL HIGH (ref 70–99)
Glucose-Capillary: 173 mg/dL — ABNORMAL HIGH (ref 70–99)

## 2020-02-16 LAB — RENAL FUNCTION PANEL
Albumin: 1.7 g/dL — ABNORMAL LOW (ref 3.5–5.0)
Anion gap: 9 (ref 5–15)
BUN: 45 mg/dL — ABNORMAL HIGH (ref 6–20)
CO2: 22 mmol/L (ref 22–32)
Calcium: 7.9 mg/dL — ABNORMAL LOW (ref 8.9–10.3)
Chloride: 102 mmol/L (ref 98–111)
Creatinine, Ser: 1.41 mg/dL — ABNORMAL HIGH (ref 0.61–1.24)
GFR, Estimated: >60 mL/min (ref >60)
Glucose, Bld: 212 mg/dL — ABNORMAL HIGH (ref 70–99)
Phosphorus: 3.5 mg/dL (ref 2.5–4.6)
Potassium: 4.9 mmol/L (ref 3.5–5.1)
Sodium: 133 mmol/L — ABNORMAL LOW (ref 135–145)

## 2020-02-16 LAB — COOXEMETRY PANEL
Carboxyhemoglobin: 2.2 % — ABNORMAL HIGH (ref 0.5–1.5)
Methemoglobin: 1 % (ref 0.0–1.5)
O2 Saturation: 67.8 %
Total hemoglobin: 7.5 g/dL — ABNORMAL LOW (ref 12.0–16.0)

## 2020-02-16 LAB — HEPARIN LEVEL (UNFRACTIONATED): Heparin Unfractionated: <0.1 IU/mL — ABNORMAL LOW (ref 0.30–0.70)

## 2020-02-16 LAB — LACTATE DEHYDROGENASE: LDH: 431 U/L — ABNORMAL HIGH (ref 98–192)

## 2020-02-16 LAB — MAGNESIUM: Magnesium: 2.3 mg/dL (ref 1.7–2.4)

## 2020-02-16 MED ORDER — POTASSIUM PHOSPHATES 15 MMOLE/5ML IV SOLN
20.0000 mmol | Freq: Once | INTRAVENOUS | Status: AC
  Administered 2020-02-16: 20 mmol via INTRAVENOUS
  Filled 2020-02-16: qty 6.67

## 2020-02-16 NOTE — Progress Notes (Addendum)
Patient ID: Corey Palmer, male   DOB: [birth year], 50 y.o.   MRN: [id number]     Advanced Heart Failure Rounding Note  PCP-Cardiologist: No primary care provider on file.   Subjective:    - 12/7 Torsades -ICD shock x1.  - 12/11 Impella 5.5 placed.  - 12/12 Impella repositioned in OR - 12/15 CABG x 4 with LIMA-LAD, SVG-PDA/PLV, SVG-OM - 12/16 DCCV afib.  Back to OR to re-open chest and again to evacuate hematoma.  - 12/19 CVVH initiated  - 12/23 Chest closed - 12/24 Chest reopened - 12/26 RAP back into NSR - 12/27 back in atrial fibrillation, unable to rapid atrial pace out.   - 12/28 To OR, chest partially closed and wound vac placed.  DCCV to NSR.  - 12/31 Antibiotics stopped. Swan removed, HD catheter moved from right femoral to right IJ.  - 1/5 To OR for chest closure. Chest reopened emergently at bedside again to evacuate hematoma  - 1/10 To OR: omental flap closure of chest with wound vac, tracheostomy, G-tube, J-tube.    - 1/12 Plastics and EP consulted.  - 1/13 Palliative Care consulted.  - 1/15 Went into VT, Shock 200 J and amiodarone bolus 150.   - 1/16: Milrinone stopped.    - 1/17: Partial closure of chest in OR - 1/18 Switched to dobutamine 4 mcg  Dobutamine 4 mcg + Norepi 40 mcg. CO-OX 68%   Remains on CVVH pulling 50 mL/hr  Awake on the vent.   Impella 5.5 P-4 Flow 2.1  LDH 383=>410 =>412 => 437=> 403 => 409=> 451=>451=>431   Objective:   Weight Range: 97.1 kg Body mass index is 26.76 kg/m.   Vital Signs:   Temp:  [97.7 F (36.5 C)-98.6 F (37 C)] 98.6 F (37 C) (01/19 0400) Pulse Rate:  [72-96] 73 (01/19 0700) Resp:  [9-32] 17 (01/19 0700) BP: (74-102)/(44-74) 99/69 (01/19 0400) SpO2:  [94 %-100 %] 100 % (01/19 0700) Arterial Line BP: (51-111)/(38-58) 106/50 (01/19 0700) FiO2 (%):  [30 %-40 %] 30 % (01/19 0400) Weight:  [97.1 kg] 97.1 kg (01/19 0444) Last BM Date: 02/15/20  Weight change: Filed Weights   02/13/20 0500 02/15/20 0437 02/16/20  0444  Weight: 98 kg 97.5 kg 97.1 kg    Intake/Output:   Intake/Output Summary (Last 24 hours) at 02/16/2020 0721 Last data filed at 02/16/2020 0700 Gross per 24 hour  Intake 4836.23 ml  Output 5383 ml  Net -546.77 ml      Physical Exam  General:  Intubated. HEENT: normal Neck: trach supple. Difficult to assess JVP. Carotids 2+ bilat; no bruits. No lymphadenopathy or thryomegaly appreciated. Cor: PMI nondisplaced. Regular rate & rhythm. No rubs, gallops or murmurs. Sternal wound. R axillary impella.  Lungs: Coarse throughout Abdomen: soft, nontender, nondistended. No hepatosplenomegaly. No bruits or masses. Good bowel sounds. Extremities: no cyanosis, clubbing, rash, R and LLE SCDs Neuro: alert, cranial nerves grossly intact. moves all 4 extremities w/o difficulty   Telemetry   NSR 70s .   Labs    CBC Recent Labs    02/15/20 0254 02/16/20 0310  WBC 18.9* 18.2*  NEUTROABS 14.4* 14.5*  HGB 8.3* 7.5*  HCT 26.4* 24.3*  MCV 89.5 89.0  PLT 516* 828*   Basic Metabolic Panel Recent Labs    02/15/20 0254 02/15/20 1520 02/16/20 0310  NA 136 136 134*  K 4.9 4.0 4.5  CL 102 104 101  CO2 24 21* 24  GLUCOSE 143* 186* 170*  BUN 33*  32* 35*  CREATININE 1.24 1.29* 1.24  CALCIUM 8.0* 8.3* 8.2*  MG 2.4  --  2.3  PHOS 2.9 2.7  --    Liver Function Tests Recent Labs    02/15/20 1520 02/16/20 0310  AST  --  57*  ALT  --  51*  ALKPHOS  --  261*  BILITOT  --  1.2  PROT  --  5.8*  ALBUMIN 1.8* 1.8*   No results for input(s): LIPASE, AMYLASE in the last 72 hours. Cardiac Enzymes No results for input(s): CKTOTAL, CKMB, CKMBINDEX, TROPONINI in the last 72 hours.  BNP: BNP (last 3 results) Recent Labs    01/22/2020 1619  BNP 577.5*    ProBNP (last 3 results) No results for input(s): PROBNP in the last 8760 hours.   D-Dimer No results for input(s): DDIMER in the last 72 hours. Hemoglobin A1C No results for input(s): HGBA1C in the last 72 hours. Fasting Lipid  Panel No results for input(s): CHOL, HDL, LDLCALC, TRIG, CHOLHDL, LDLDIRECT in the last 72 hours. Thyroid Function Tests No results for input(s): TSH, T4TOTAL, T3FREE, THYROIDAB in the last 72 hours.  Invalid input(s): FREET3  Other results:   Imaging    No results found.   Medications:     Scheduled Medications: . vitamin C  500 mg Per Tube TID  . aspirin  324 mg Per Tube Daily  . atorvastatin  80 mg Per Tube Daily  . busPIRone  10 mg Per Tube TID  . chlorhexidine gluconate (MEDLINE KIT)  15 mL Mouth Rinse BID  . Chlorhexidine Gluconate Cloth  6 each Topical Daily  . darbepoetin (ARANESP) injection - NON-DIALYSIS  100 mcg Subcutaneous Q Sat-1800  . docusate  100 mg Per Tube BID  . feeding supplement (PROSource TF)  45 mL Per Tube TID  . fiber  1 packet Per Tube BID  . FLUoxetine  40 mg Per Tube BID  . HYDROmorphone  4 mg Oral Q6H  . insulin aspart  1-3 Units Subcutaneous Q4H  . insulin aspart  6 Units Subcutaneous Q4H  . insulin detemir  18 Units Subcutaneous Q12H  . LORazepam  0.25 mg Intravenous Q4H  . mouth rinse  15 mL Mouth Rinse 10 times per day  . melatonin  5 mg Per Tube Daily  . mexiletine  150 mg Oral Q8H  . midodrine  20 mg Per Tube TID WC  . multivitamin  1 tablet Per Tube QHS  . nutrition supplement (JUVEN)  1 packet Per Tube BID BM  . pantoprazole (PROTONIX) IV  40 mg Intravenous Q12H  . polyethylene glycol  17 g Per Tube Daily  . sodium chloride flush  10-40 mL Intracatheter Q12H  . thiamine  100 mg Per Tube Daily  . valproic acid  250 mg Per Tube BID    Infusions: .  prismasol BGK 4/2.5 500 mL/hr at 02/15/20 2212  .  prismasol BGK 4/2.5 200 mL/hr at 02/15/20 1603  . sodium chloride Stopped (01/29/20 0955)  . sodium chloride    . amiodarone 30 mg/hr (02/16/20 0700)  . dexmedetomidine (PRECEDEX) IV infusion 1 mcg/kg/hr (02/16/20 0700)  . dextrose    . DOBUTamine 4 mcg/kg/min (02/16/20 0700)  . electrolyte-A Stopped (02/03/20 4580)  .  epinephrine Stopped (02/08/20 0651)  . feeding supplement (PIVOT 1.5 CAL) 70 mL/hr at 02/04/2020 1628  . impella catheter heparin 50 unit/mL in dextrose 5%    . HYDROmorphone 4 mg/hr (02/16/20 0700)  . insulin Stopped (02/15/20 0941)  .  lactated ringers 10 mL/hr at 02/16/20 0700  . norepinephrine (LEVOPHED) Adult infusion 40 mcg/min (02/16/20 0700)  . prismasol BGK 4/2.5 1,500 mL/hr at 02/16/20 0329  . vasopressin Stopped (02/15/20 0841)    PRN Medications: sodium chloride, Place/Maintain arterial line **AND** sodium chloride, artificial tears, bisacodyl **OR** bisacodyl, dextrose, docusate, heparin, heparin, HYDROmorphone, lip balm, metoprolol tartrate, midazolam, polyethylene glycol, sodium chloride flush     Assessment/Plan   1. Cardiogenic shock/acute on chronic systolic CHF: Initially nonischemic cardiomyopathy.  Possible familial cardiomyopathy as both parents had cardiomyopathy and died at around 49. However, [employer] gene testing did not show any common mutation for cardiomyopathy.  However, this admission noted to have severe 3 vessel disease so suspect component of ischemic cardiomyopathy.  [employer] Jude ICD.  Echo in 8/20 with EF 15% and mildly decreased RV function. Echo this admission with EF < 20%, moderate LV dilation, RV mildly reduced, severe LAE, no significant MR. Low output HF with markedly low EF.  He has a long history of cardiomyopathy (20 yrs), tends to minimize symptoms.  Cardiorenal syndrome with creatinine up to 2.2,  stabilized on milrinone and Impella 5.5. SCr 1.44 day of CABG. CABG 12/15.  Post-op shock, back to OR 12/16 to open chest (chest wall compartment syndrome) and again to evacuate hematoma.  TEE post-op with severe RV dysfunction.  CVVHD initiated 12/19 for volume removal in setting of rising Scr/decreased UOP. Suspect ATN. S/p chest closure 12/23. Hemodynamics much worse on 12/24 with rising pressor demands. Co-ox 36%.  Bedside echo severe biventricular dysfunction  with marked septal bounce. Chest re-opened 12/24.  Was atrial paced back to NSR on 12/26 with improvement in hemodynamics but back in atrial fibrillation on 12/27 and unable to pace out, DCCV to NSR on 12/28. Went back to OR 1/5 for chest closure but several hours later required emergent re-opening of chest at bedside to evacuate hematoma. To OR again 1/10 with closure of chest with omental flap with G & J tube.  1/17 partial closure.  - Remains Dobutamine 4 mcg + Norepi 40 mcg. Impella P-4 with co-ox 68%  - Continue CVVH.  - Having difficult weaning impella. (Goal MAP 70-90).  2. Atrial fibrillation: H/o PAF.  He was on dofetilide in the remote past but this was stopped due to noncompliance.  He had an upper GI bleed from antral ulcers in 3/12. He was seen by GI and was deemed safe to restart anticoagulation as long as he remains on a PPI.  No apparent recurrence of AF until just prior to this admission, was cardioverted back to NSR in ER but went back into atrial fibrillation.  Post-op afib, DCCV to NSR/BiV pacing am 12/16 -> back to AF. Rapid atrial pacing back to SR  - Maintaining NSR - Continue amio 30 mg per hour.   - Getting heparin through impella purge 3. CAD:   Cath this admission with severe 3 vessel disease. CABG x 4 on 12/15.  - Continue atorvastatin, ASA.  4. Acute on chronic hypoxemic respiratory failure: Vent per CCM.  Has tracheostomy.  - CCM following.  5. AKI on CKD stage 3: Likely ATN/cardiorenal. Anuric. On CVVH.    - Pulling 50 mL/hr  6. Diabetes: Insulin.  7. Hyponatremia: Resolved with CVVH.  8. Torsades/ICD shock: 12/7/212 in hospital event, in setting of severe hypokalemia and hypomagnesemia.   9. Gout: h/o gout. Complained of rt knee pain c/w previous flares - treated w/ prednisone burst (completed).  10: ID: completed abx for  PNA.  Afebrile.  Cultures NGTD. WBCs 18, will repeat surveillance cultures.    11. FEN: On TFS s via J-tube. 12. Anemia: Stable 7.5  today.   Transfuse < 7.5.  13. Stage II Pressure Ulcer- sacrum -Continue to reposition every 2 hours R/L  14. Pleural Effusion  - loculated rt apical pleural effusion noted on imaging.  - continue UF through CVVH  - PCCM following  15. VT - RUN of NSVT required amio 150 bolus, followed by shock 200J.   - EP consulted, started mexitil.   - keep K > 4.0 and Mg > 2.0 -No VT overnight.  16. Abd pain - G-J tube flushed at bedside  - CT C/A/P showed no source for the abdominal pain - TFs ongoing.    Darrick Grinder, NP 02/16/2020 7:21 AM   Patient seen with NP, agree with the above note.   VT quiescent over the last day on amiodarone gtt + mexiletine.   Today, he is on dobutamine 4 + NE 40, off vasopressin. Early am co-ox 68%.  CVP 7, on CVVH with UF net 50 cc/hr yesterday.   Impella P4 currently, turned up from P3 last night.    General: NAD Neck: Trach. No JVD, no thyromegaly or thyroid nodule.  Lungs: Decreased at bases.  CV: Nondisplaced PMI.  Heart regular S1/S2, no S3/S4, no murmur.  No peripheral edema.   Abdomen: Soft, nontender, no hepatosplenomegaly, no distention.  Skin: Intact without lesions or rashes.  Neurologic: Alert and oriented x 3.  Psych: Normal affect. Extremities: No clubbing or cyanosis.  HEENT: Normal.   At this point, will continue to focus on weaning Impella. He is not a candidate for LVAD or transplant at this point with RRT, so if we cannot wean and remove Impella there is really no chance for recovery. Good co-ox 68% currently.  - Continue dobutamine 4 + NE (wean as able, would aim for MAP > 60). - Decrease Impella to P3 again today and follow. Getting heparin via purge only, LDH stable at 431.  If we can keep him stable at P3 overnight, would consider removal.  Will need discussion of goals of care with family and care team prior to removal.  - With CVP 7, can aim for I/Os even via CVVH.    VT quiescent, continue amiodarone gtt and mexiletine 150 mg q8  hrs.   TFs ongoing via [place] tube.  CRITICAL CARE Performed by: Loralie Champagne  Total critical care time: 40 minutes  Critical care time was exclusive of separately billable procedures and treating other patients.  Critical care was necessary to treat or prevent imminent or life-threatening deterioration.  Critical care was time spent personally by me on the following activities: development of treatment plan with patient and/or surrogate as well as nursing, discussions with consultants, evaluation of patient's response to treatment, examination of patient, obtaining history from patient or surrogate, ordering and performing treatments and interventions, ordering and review of laboratory studies, ordering and review of radiographic studies, pulse oximetry and re-evaluation of patient's condition.  Loralie Champagne 02/16/2020 8:04 AM

## 2020-02-16 NOTE — Progress Notes (Signed)
[facility] Progress Note    Assessment/ Plan:    75M with familial cardiomyopathy EF20% s/p redo CABG 35/70/17 complicated by persistent shock, multiple attempts at sternal closure with formation of chest hematoma and tamponade, Afib with RVR, and AKI requiring CRRT.    # Anuric AKI/CKD stage III- presumably due to ischemic ATN in setting of cardiogenic shock s/p CABG.  CRRT started 01/16/20.  All fluids 4K/2.5Ca.  Right femoral HD catheter placed 01/16/20 switched to [place] on 01/28/20.  - continue CRRT--> goal net neg 50-75cc/hr.   - Not ready for transition to iHD - replete phos today - no change to CRRT settings today - Rec addressimg GOC and consideration of transition to comfort meaures  # CAD s/p CABG complicated by cardiogenic shock and post-op hematoma. secondary closure on 12/23 then had to be re opened.  Went to the OR for washout on 12/28, bedside washout 02/01/19.  OR 02/01/2020 for closure, then had emergent reopening at the bedside 02/01/2020 d/t anterior chest hematoma again. OR for chest closure on 1/10.  Plastic surgery partial closure 1/17  # Cardiogenic shock- underlying familial cardiomyopathy s/p redo CABG, EF ~20%. Remains on pressors, inotrope, Impella. Midodrine 20 tid  # ABLA- s/p postoperative bleeding into chest cavity s/p re-exploration for tamponade/hematoma. Received multiple blood products  # Atrial fibrillation - s/p DCCV on 12/16.  On amiodarone.   # VDRF- per CHF/PCCMsvc. Trach/peg 1/10  #-Leukocytosis/ new fever: per primary  # R loculated pleural effusion: for CT chest on 1/6 with contrast   #NSVT: per CV/EP  Subjective:      Remains on CRRT, K 4.5, P 2.3  All 4K, gentle UF as able  impella weening being attempted  Remains on NE and now on dobutamine  Not clotting off CRRT   Objective:   BP 100/69   Pulse 80   Temp 98 F (36.7 C) (Oral)   Resp 18   Ht '6\' 3"'  (1.905 m)   Wt 97.1 kg   SpO2 100%   BMI 26.76 kg/m    Intake/Output Summary (Last 24 hours) at 02/16/2020 0948 Last data filed at 02/16/2020 0800 Gross per 24 hour  Intake 4696.17 ml  Output 4925 ml  Net -228.83 ml   Weight change: -0.4 kg  Physical Exam:  Gen: adult male nad CVS: normal sinus - RRR CHEST: woundvac foam in place, R [place] Impella Resp: clear but diminished anteriorly trach Abd: soft ND Ext:1-2+ edema hips and legs Neuro: awake and follows some simple commands ACCESS: R IJ nontunneled HD cath  Imaging: DG Abd 1 View  Result Date: 02/10/2020 CLINICAL DATA:  Abdominal pain EXAM: ABDOMEN - 1 VIEW COMPARISON:  X-ray abdomen 02/11/2020 FINDINGS: Skin staples overlie the mid abdomen. Right upper quadrant surgical clips. Left upper abdomen tube, likely percutaneous gastrostomy tube, overlying the left upper abdomen. Other subjacent tube likely representing a percutaneous jejunostomy tube. Possible other 2 of unclear etiology overlying the left lower quadrant. Gaseous distension of a couple loops of small bowel in the right abdomen. The bowel gas pattern is normal. No radio-opaque calculi or other significant radiographic abnormality are seen. IMPRESSION: Nonobstructive bowel gas pattern. Electronically Signed   By: Iven Finn M.D.   On: 02/15/2020 15:30   DG CHEST PORT 1 VIEW  Result Date: 02/16/2020 CLINICAL DATA:  Tracheostomy. Open-heart surgery. Respiratory failure. EXAM: PORTABLE CHEST 1 VIEW COMPARISON:  02/13/2020. FINDINGS: Tracheostomy tube, right IJ line, right PICC line, bilateral chest tubes, mediastinal drainage catheter in  stable position. Impella device in stable position. Cardiac pacer in stable position. Stable cardiomegaly. Low lung volumes with diffuse bilateral interstitial prominence again noted. Interstitial edema and/or pneumonitis could present in this fashion. Small right pleural effusion. Persistent right apical pleural density noted. This could represent loculated effusion. No pneumothorax. IMPRESSION: 1.  Lines and tubes including bilateral chest tubes in stable position. No pneumothorax. 2. Impella device and cardiac pacer stable position. Stable cardiomegaly. 3. Low lung volumes with diffuse bilateral interstitial prominence again noted. Interstitial edema and/or pneumonitis could present in this fashion. Small right pleural effusion. Stable right apical pleural based density. This could represent loculated fluid. Similar findings noted on prior exam. Electronically Signed   By: Silt   On: 02/16/2020 08:03    Labs: BMET Recent Labs  Lab 02/12/20 1604 02/13/20 0327 02/13/20 1550 02/01/2020 0338 02/01/2020 1533 02/15/20 0254 02/15/20 1520 02/16/20 0310  NA 133* 134* 132* 133* 135 136 136 134*  K 4.7 4.5 4.3 4.3 4.7 4.9 4.0 4.5  CL 101 101 100 101 102 102 104 101  CO2 '23 24 22 22 24 24 ' 21* 24  GLUCOSE 159* 214* 206* 121* 127* 143* 186* 170*  BUN 36* 33* 34* 37* 31* 33* 32* 35*  CREATININE 1.29* 1.34* 1.33* 1.30* 1.35* 1.24 1.29* 1.24  CALCIUM 7.9* 8.3* 8.0* 8.2* 7.9* 8.0* 8.3* 8.2*  PHOS 2.5 1.9* 1.6* 2.0* 2.9 2.9 2.7  --    CBC Recent Labs  Lab 02/13/20 0327 02/15/2020 0338 02/15/20 0254 02/16/20 0310  WBC 13.7* 15.4* 18.9* 18.2*  NEUTROABS 10.2* 11.9* 14.4* 14.5*  HGB 9.1* 9.1* 8.3* 7.5*  HCT 28.4* 29.6* 26.4* 24.3*  MCV 88.2 88.6 89.5 89.0  PLT 446* 440* 516* 447*    Medications:    . vitamin C  500 mg Per Tube TID  . aspirin  324 mg Per Tube Daily  . atorvastatin  80 mg Per Tube Daily  . busPIRone  10 mg Per Tube TID  . chlorhexidine gluconate (MEDLINE KIT)  15 mL Mouth Rinse BID  . Chlorhexidine Gluconate Cloth  6 each Topical Daily  . darbepoetin (ARANESP) injection - NON-DIALYSIS  100 mcg Subcutaneous Q Sat-1800  . docusate  100 mg Per Tube BID  . feeding supplement (PROSource TF)  45 mL Per Tube TID  . fiber  1 packet Per Tube BID  . FLUoxetine  40 mg Per Tube BID  . HYDROmorphone  4 mg Oral Q6H  . insulin aspart  1-3 Units Subcutaneous Q4H  .  insulin aspart  6 Units Subcutaneous Q4H  . insulin detemir  18 Units Subcutaneous Q12H  . LORazepam  0.25 mg Intravenous Q4H  . mouth rinse  15 mL Mouth Rinse 10 times per day  . melatonin  5 mg Per Tube Daily  . mexiletine  150 mg Oral Q8H  . midodrine  20 mg Per Tube TID WC  . multivitamin  1 tablet Per Tube QHS  . nutrition supplement (JUVEN)  1 packet Per Tube BID BM  . pantoprazole (PROTONIX) IV  40 mg Intravenous Q12H  . polyethylene glycol  17 g Per Tube Daily  . sodium chloride flush  10-40 mL Intracatheter Q12H  . thiamine  100 mg Per Tube Daily  . valproic acid  250 mg Per Tube BID      Rexene Agent  02/16/2020, 9:48 AM

## 2020-02-16 NOTE — Progress Notes (Signed)
2 Days Post-Op  Subjective: Patient is sedated  Objective: Vital signs in last 24 hours: Temp:  [97.7 F (36.5 C)-98.6 F (37 C)] 98 F (36.7 C) (01/19 0746) Pulse Rate:  [72-96] 85 (01/19 0746) Resp:  [9-32] 21 (01/19 0746) BP: (74-99)/(44-74) 99/69 (01/19 0400) SpO2:  [94 %-100 %] 100 % (01/19 0746) Arterial Line BP: (51-111)/(38-58) 110/55 (01/19 0745) FiO2 (%):  [30 %-40 %] 30 % (01/19 0746) Weight:  [97.1 kg] 97.1 kg (01/19 0444) Last BM Date: 02/15/20  Intake/Output from previous day: 01/18 0701 - 01/19 0700 In: 4836.2 [I.V.:2306.4; NG/GT:1680; IV Piggyback:190.3] Out: 5383 [Emesis/NG output:1; Drains:50; Stool:400; Chest Tube:100] Intake/Output this shift: No intake/output data recorded.  General appearance: Ill-appearing, sedated Chest: Wound VAC in place with approximately 380 cc of sanguinous drainage in canister.  Good seal noted.  Abra straps in place.  No surrounding erythema noted.  No drainage outside of the wound VAC noted.  Multiple tube insertion sites noted inferiorly.  Staples noted along the right superior chest.  Lab Results:  CBC Latest Ref Rng & Units 02/16/2020 02/15/2020 02/27/2020  WBC 4.0 - 10.5 K/uL 18.2(H) 18.9(H) 15.4(H)  Hemoglobin 13.0 - 17.0 g/dL 7.5(L) 8.3(L) 9.1(L)  Hematocrit 39.0 - 52.0 % 24.3(L) 26.4(L) 29.6(L)  Platelets 150 - 400 K/uL 447(H) 516(H) 440(H)    BMET Recent Labs    02/15/20 1520 02/16/20 0310  NA 136 134*  K 4.0 4.5  CL 104 101  CO2 21* 24  GLUCOSE 186* 170*  BUN 32* 35*  CREATININE 1.29* 1.24  CALCIUM 8.3* 8.2*   PT/INR No results for input(s): LABPROT, INR in the last 72 hours. ABG No results for input(s): PHART, HCO3 in the last 72 hours.  Invalid input(s): PCO2, PO2  Studies/Results: DG Abd 1 View  Result Date: 02/10/2020 CLINICAL DATA:  Abdominal pain EXAM: ABDOMEN - 1 VIEW COMPARISON:  X-ray abdomen 02/11/2020 FINDINGS: Skin staples overlie the mid abdomen. Right upper quadrant surgical clips. Left  upper abdomen tube, likely percutaneous gastrostomy tube, overlying the left upper abdomen. Other subjacent tube likely representing a percutaneous jejunostomy tube. Possible other 2 of unclear etiology overlying the left lower quadrant. Gaseous distension of a couple loops of small bowel in the right abdomen. The bowel gas pattern is normal. No radio-opaque calculi or other significant radiographic abnormality are seen. IMPRESSION: Nonobstructive bowel gas pattern. Electronically Signed   By: Iven Finn M.D.   On: 02/09/2020 15:30   DG CHEST PORT 1 VIEW  Result Date: 02/16/2020 CLINICAL DATA:  Tracheostomy. Open-heart surgery. Respiratory failure. EXAM: PORTABLE CHEST 1 VIEW COMPARISON:  02/13/2020. FINDINGS: Tracheostomy tube, right IJ line, right PICC line, bilateral chest tubes, mediastinal drainage catheter in stable position. Impella device in stable position. Cardiac pacer in stable position. Stable cardiomegaly. Low lung volumes with diffuse bilateral interstitial prominence again noted. Interstitial edema and/or pneumonitis could present in this fashion. Small right pleural effusion. Persistent right apical pleural density noted. This could represent loculated effusion. No pneumothorax. IMPRESSION: 1. Lines and tubes including bilateral chest tubes in stable position. No pneumothorax. 2. Impella device and cardiac pacer stable position. Stable cardiomegaly. 3. Low lung volumes with diffuse bilateral interstitial prominence again noted. Interstitial edema and/or pneumonitis could present in this fashion. Small right pleural effusion. Stable right apical pleural based density. This could represent loculated fluid. Similar findings noted on prior exam. Electronically Signed   By: [place]   On: 02/16/2020 08:03    Anti-infectives: Anti-infectives (From admission, onward)  Start     Dose/Rate Route Frequency Ordered Stop   02/11/2020 1115  vancomycin (VANCOREADY) IVPB 1500 mg/300 mL   Status:  Discontinued        1,500 mg 150 mL/hr over 120 Minutes Intravenous NOW 02/04/2020 1103 02/13/2020 1245   02/03/2020 1400  vancomycin (VANCOCIN) IVPB 1000 mg/200 mL premix  Status:  Discontinued        1,000 mg 200 mL/hr over 60 Minutes Intravenous Every 24 hours 02/01/20 1011 02/09/20 0957   02/01/20 1400  meropenem (MERREM) 1 g in sodium chloride 0.9 % 100 mL IVPB  Status:  Discontinued        1 g 200 mL/hr over 30 Minutes Intravenous Every 8 hours 02/01/20 1011 02/09/20 0957   02/01/20 1100  vancomycin (VANCOREADY) IVPB 2000 mg/400 mL        2,000 mg 200 mL/hr over 120 Minutes Intravenous  Once 02/01/20 1011 02/01/20 1327   01/28/20 1300  vancomycin (VANCOCIN) IVPB 1000 mg/200 mL premix  Status:  Discontinued        1,000 mg 200 mL/hr over 60 Minutes Intravenous Every 24 hours 01/27/20 1430 01/28/20 0830   01/24/20 1300  vancomycin (VANCOCIN) IVPB 1000 mg/200 mL premix  Status:  Discontinued        1,000 mg 200 mL/hr over 60 Minutes Intravenous Every 24 hours 01/24/20 0846 01/27/20 1430   01/22/20 1601  vancomycin variable dose per unstable renal function (pharmacist dosing)  Status:  Discontinued         Does not apply See admin instructions 01/22/20 1601 01/24/20 0846   01/21/20 1200  vancomycin (VANCOREADY) IVPB 1250 mg/250 mL  Status:  Discontinued        1,250 mg 166.7 mL/hr over 90 Minutes Intravenous Every 24 hours 01/06/2020 1620 01/22/20 1457   01/09/2020 1800  meropenem (MERREM) 1 g in sodium chloride 0.9 % 100 mL IVPB  Status:  Discontinued        1 g 200 mL/hr over 30 Minutes Intravenous Every 8 hours 01/24/2020 1800 01/28/20 0830   01/19/2020 1348  vancomycin (VANCOCIN) 1,000 mg in sodium chloride 0.9 % 1,000 mL irrigation  Status:  Discontinued          As needed 01/06/2020 1349 01/13/2020 1611   01/14/2020 1015  vancomycin (VANCOREADY) IVPB 1500 mg/300 mL        1,500 mg 150 mL/hr over 120 Minutes Intravenous On call to O.R. 01/03/2020 0927 01/07/2020 1556   01/19/20 1700   vancomycin (VANCOREADY) IVPB 1250 mg/250 mL  Status:  Discontinued        1,250 mg 166.7 mL/hr over 90 Minutes Intravenous Every 24 hours 01/18/20 1618 01/05/2020 1620   01/16/20 1700  vancomycin (VANCOREADY) IVPB 1500 mg/300 mL  Status:  Discontinued        1,500 mg 150 mL/hr over 120 Minutes Intravenous Every 24 hours 01/16/20 1121 01/18/20 1618   01/15/20 1700  vancomycin (VANCOREADY) IVPB 750 mg/150 mL  Status:  Discontinued        750 mg 150 mL/hr over 60 Minutes Intravenous Every 12 hours 01/15/20 1107 01/16/20 1121   01/04/2020 1535  vancomycin (VANCOCIN) powder  Status:  Discontinued          As needed 01/09/2020 1536 01/25/2020 1619   01/28/2020 1400  meropenem (MERREM) 1 g in sodium chloride 0.9 % 100 mL IVPB  Status:  Discontinued        1 g 200 mL/hr over 30 Minutes Intravenous Every 8 hours 01/05/2020  1007 01/19/2020 1800   01/01/2020 1400  levofloxacin (LEVAQUIN) IVPB 500 mg  Status:  Discontinued        500 mg 100 mL/hr over 60 Minutes Intravenous To Surgery 01/19/2020 1354 01/10/2020 1619   01/16/2020 1345  vancomycin (VANCOREADY) IVPB 1500 mg/300 mL  Status:  Discontinued        1,500 mg 150 mL/hr over 120 Minutes Intravenous To Surgery 01/24/2020 1340 01/12/2020 1619   01/26/2020 1345  cefUROXime (ZINACEF) 1.5 g in sodium chloride 0.9 % 100 mL IVPB  Status:  Discontinued        1.5 g 200 mL/hr over 30 Minutes Intravenous To Surgery 12/30/2019 1340 01/12/2020 1353   01/10/2020 1345  cefUROXime (ZINACEF) 750 mg in sodium chloride 0.9 % 100 mL IVPB  Status:  Discontinued        750 mg 200 mL/hr over 30 Minutes Intravenous To Surgery 01/23/2020 1340 01/12/2020 1353   01/14/2020 1002  vancomycin (VANCOCIN) IVPB 1000 mg/200 mL premix  Status:  Discontinued        1,000 mg 200 mL/hr over 60 Minutes Intravenous Every 12 hours 01/22/2020 0948 01/15/20 1049   01/04/2020 1000  levofloxacin (LEVAQUIN) IVPB 750 mg        750 mg 100 mL/hr over 90 Minutes Intravenous Every 24 hours 01/22/2020 1512 01/22/2020 1900   01/22/2020 2130   vancomycin (VANCOCIN) IVPB 1000 mg/200 mL premix        1,000 mg 200 mL/hr over 60 Minutes Intravenous  Once 01/23/2020 1512 01/14/2020 1718   01/18/2020 1018  vancomycin (VANCOCIN) powder  Status:  Discontinued          As needed 12/29/2019 1019 01/13/2020 1511   01/08/2020 0400  vancomycin (VANCOREADY) IVPB 1500 mg/300 mL        1,500 mg 150 mL/hr over 120 Minutes Intravenous To Surgery 01/11/20 1357 12/30/2019 1100   01/26/2020 0400  levofloxacin (LEVAQUIN) IVPB 500 mg        500 mg 100 mL/hr over 60 Minutes Intravenous To Surgery 01/11/20 1357 01/11/2020 1000      Assessment/Plan: s/p Procedure(s): STERNAL WOUND DEBRIDEMENT WITH PARTIAL CLOSURE OF STERNAL WOUND APPLICATION OF A-CELL APPLICATION OF WOUND VAC  Abra straps advanced with Dr. Marla Roe today. Wound VAC in place, appears stable.  Recommend changing wound VAC canister today.  We will continue to monitor.  We will further discuss plan with CT surgery for possibility of returning to the OR for additional closure/application of wound matrix/wound VAC change next week.    LOS: 48 days    Charlies Constable, PA-C 02/16/2020

## 2020-02-16 NOTE — Progress Notes (Signed)
Pt placed on 40% ATC per MD request. Pt is tolerating well at this time. RN aware.

## 2020-02-16 NOTE — Progress Notes (Signed)
Pt placed back on PSV 12/5 due to pt complaining of SOB. RN aware.

## 2020-02-16 NOTE — Progress Notes (Signed)
NAME:  Corey Palmer, MRN:  [id number], DOB:  06-11-1970, LOS: 15 ADMISSION DATE:  01/23/2020, CONSULTATION DATE: 01/12/2020 REFERRING MD: Aundra Dubin, CHIEF COMPLAINT: Respiratory failure post Impella  HPI/course in hospital  50 year old man admitted with A. fib/RVR, torsades, cardiogenic shock [EF 15] requiring Impella, CABG x5, AKI requiring initiation of CRRT  Past Medical History:    has a past medical history of Anginal pain ([place]), Anxiety, Arthritis, Automatic implantable cardioverter-defibrillator in situ (2007), Bipolar disorder ([place]), CAD (coronary artery disease), Cancer ([place]) (2013), CHF (congestive heart failure) ([place]), Chondrocalcinosis of right knee (7/67/2094), Chronic systolic heart failure ([place]), Depression, Diabetes uncomplicated adult-type II, Dilated cardiomyopathy ([place]), Dyslipidemia, Dysrhythmia, GERD (gastroesophageal reflux disease), Gout, unspecified, Hepatic steatosis (2011), History of kidney stones, echocardiogram, Hypertension, Obesity (BMI 30.0-34.9), Pacemaker, Paroxysmal atrial fibrillation ([place]), Peptic ulcer, Shortness of breath, and Sleep apnea.  Significant Hospital Events:  12/2 Afib with RVR. Cardioverted in ED. AICD did not fire. Milrinone for low co ox.  12/7 ICD fired, Torsades- likely from low K of 2.8.  12/8 Cath- multivessel disease w/ low CO. Volume overloaded.  12/11 Underwent Impella 5.5 insertion for persistent symptoms of forward failure with low cardiac indices. 12/12 Back to the OR for displacement of Impella. Extubated.  12/15 CABG x4, with LIMA-LAD, SVG-PDA/PLV, SVG-OM 12/16 Emergently opened chest at bedside for tamponade, clot compressing right atrium  12/16 To OR for Exploration of chest due to bleeding  12/19 Started CRRT 12/23 Sternal Closure in OR 12/24 Worsening shock.  Chest reopened at bedside with improvement in hemodynamics.  No mediastinal hematoma. 12/25 A flutter > rapid a paced to sinus rhythm.  Remains on high-dose pressors,  inotropes 12/28 Weaning vasopressor requirements.  For chest washout today.  Back in atrial fibrillation, refractory to increased amiodarone and attempted overdrive pacing.  DCCV to NSR. Tolerating fluid removal via CRRT. 1/3: Appears euvolemic.  Adjusting glycemic control.  Chest still open so not weaning 1/4, spiking fever, Vanco meropenem resumed after culture sent from blood and sputum. 1/5 awaiting return to the OR for wound VAC dressing assessment fever curve and white blood cell count improving after antibiotics resumed the day prior, did have chest closed, developed tamponade physiology shortly after had to go back for emergent reopening of chest, return to intensive care and refractory shock 1/6: Wound VAC dressing replaced.  Still in shock.  Starting to wean sedation 1/7 >> : Still on high-dose pressors, CRRT 1/15 recurrent Vtach, shocked 1/18>> Attempted pressure change to P 3 with addition of Dobutamine . MAP goal > 60 1/19>>ongoing attempts at impella wean, on PS  Consults:  PCCM, nephrology, cardiology, cardiothoracic surgery, EP  Procedures:  12/2 R PICC >> 12/11 right ax impella 5.5 >> 12/15 ETT >> 12/15 right femoral arterial sheath >> 12/19 right femoral HD >> 12/23 chest tube-right pleural, left pleural , mediastinal  >> 1/10  Trach->1/11 1/10 PEG->1/11  Significant Diagnostic Tests:    Micro Data:  See micro tab  Antimicrobials:  See fever tab  Interval history:  No events. Was on P4 overnight again now back to P3. More sedated this AM.  Objective   Blood pressure 100/69, pulse 80, temperature 98 F (36.7 C), temperature source Oral, resp. rate 18, height 6\' 3"  (1.905 m), weight 97.1 kg, SpO2 100 %. CVP:  [8 mmHg-13 mmHg] 13 mmHg  Vent Mode: PSV;CPAP FiO2 (%):  [30 %-40 %] 30 % Set Rate:  [28 bmp] 28 bmp Vt Set:  [510 mL] 510 mL PEEP:  [5 cmH20]  5 cmH20 Pressure Support:  [15 cmH20] 15 cmH20 Plateau Pressure:  [18 cmH20-28 cmH20] 18 cmH20    Intake/Output Summary (Last 24 hours) at 02/16/2020 0936 Last data filed at 02/16/2020 0800 Gross per 24 hour  Intake 4696.17 ml  Output 4925 ml  Net -228.83 ml   Filed Weights   02/13/20 0500 02/15/20 0437 02/16/20 0444  Weight: 98 kg 97.5 kg 97.1 kg   CVP:  [8 mmHg-13 mmHg] 13 mmHg  Examination: Constitutional: ill appearing man in NAD  Eyes: EOMI, pupils equal Ears, nose, mouth, and throat: trach in place, minimal secretions Cardiovascular: RRR, ext warm Respiratory: scattered rhonci, weak inspiratory efforts Gastrointestinal: soft, PEG in place Skin: No rashes, normal turgor Neurologic: weak, sedated Psychiatric: RASS -3  P4 SvO2 67% BMP/LDH stable Stable WBC Hgb down to 7.5  Assessment & Plan:   Acute hypoxic and hypercapnic respiratory failure requiring mechanical ventilation following cardiac surgery, post tracheostomy, improved, biggest issues are deconditioning and continued shock, overall improved, tolerated PS Persistent small right sided loculated pleural effusions- doubt contributing to resp failure, further management per TCTS Recurrrent Vtach- on mexilitine Cardiogenic shock s/p high risk CABG complicated by tamponade requiring open chest now post omental flap closure, vac on impella wean and delayed chest closure Vasoplegia- from prolonged critical illness on midodrine Dysphagia- s/p GJ tube Cardiorenal syndrome- on CRRT Prolonged QT- complicates PAD control Persistent agitation/anxiety/pain- remains on dilaudid gtt Profound muscular deconditioning postop due to prolonged critical illness Afib PTA DM with hyperglycemia  - TC trial today  - Continue klonipin, PO dilaudid, try to wean dilaudid drip - Basal bolus insulin started 1/18, insulin gtt off - Inotrope, pressor, and impella wean per TCTS and CHF teams - CRRT per nephrology and CHF teams - Antiarrhythmics per EP team - Guarded prognosis  Daily Goals Checklist  Pain/Anxiety/Delirium protocol  (if indicated): dilaudid gtt, weaning as able VAP protocol (if indicated): bundle in place. DVT prophylaxis:  Full dose asa Nutrition Status: TF GI prophylaxis: pantoprazole Glucose control:  Mobility/therapy needs: bed rest  Code Status: full  Family Communication: per primary Disposition: ICU   Patient critically ill due to persistent shock and respiratory failure Interventions to address this today vent wean, pressor titration Risk of deterioration without these interventions is high  CC APP time : 33 minutes Erskine Emery MD PCCM

## 2020-02-17 ENCOUNTER — Inpatient Hospital Stay (HOSPITAL_COMMUNITY): Payer: BC Managed Care – PPO

## 2020-02-17 DIAGNOSIS — Z515 Encounter for palliative care: Secondary | ICD-10-CM | POA: Diagnosis not present

## 2020-02-17 DIAGNOSIS — R570 Cardiogenic shock: Secondary | ICD-10-CM | POA: Diagnosis not present

## 2020-02-17 DIAGNOSIS — Z9281 Personal history of extracorporeal membrane oxygenation (ECMO): Secondary | ICD-10-CM | POA: Diagnosis not present

## 2020-02-17 DIAGNOSIS — J9601 Acute respiratory failure with hypoxia: Secondary | ICD-10-CM | POA: Diagnosis not present

## 2020-02-17 LAB — RENAL FUNCTION PANEL
Albumin: 1.8 g/dL — ABNORMAL LOW (ref 3.5–5.0)
Albumin: 1.7 g/dL — ABNORMAL LOW (ref 3.5–5.0)
Anion gap: 10 (ref 5–15)
Anion gap: 9 (ref 5–15)
BUN: 41 mg/dL — ABNORMAL HIGH (ref 6–20)
BUN: 44 mg/dL — ABNORMAL HIGH (ref 6–20)
CO2: 24 mmol/L (ref 22–32)
CO2: 25 mmol/L (ref 22–32)
Calcium: 8 mg/dL — ABNORMAL LOW (ref 8.9–10.3)
Calcium: 8.1 mg/dL — ABNORMAL LOW (ref 8.9–10.3)
Chloride: 101 mmol/L (ref 98–111)
Chloride: 101 mmol/L (ref 98–111)
Creatinine, Ser: 1.29 mg/dL — ABNORMAL HIGH (ref 0.61–1.24)
Creatinine, Ser: 1.21 mg/dL (ref 0.61–1.24)
GFR, Estimated: >60 mL/min (ref >60)
GFR, Estimated: >60 mL/min (ref >60)
Glucose, Bld: 189 mg/dL — ABNORMAL HIGH (ref 70–99)
Glucose, Bld: 171 mg/dL — ABNORMAL HIGH (ref 70–99)
Phosphorus: 2.8 mg/dL (ref 2.5–4.6)
Phosphorus: 2.4 mg/dL — ABNORMAL LOW (ref 2.5–4.6)
Potassium: 4.8 mmol/L (ref 3.5–5.1)
Potassium: 4.9 mmol/L (ref 3.5–5.1)
Sodium: 135 mmol/L (ref 135–145)
Sodium: 135 mmol/L (ref 135–145)

## 2020-02-17 LAB — CBC WITH DIFFERENTIAL/PLATELET
Abs Immature Granulocytes: 0.96 10*3/uL — ABNORMAL HIGH (ref 0.00–0.07)
Basophils Absolute: 0.1 10*3/uL (ref 0.0–0.1)
Basophils Relative: 1 %
Eosinophils Absolute: 0.6 10*3/uL — ABNORMAL HIGH (ref 0.0–0.5)
Eosinophils Relative: 3 %
HCT: 26 % — ABNORMAL LOW (ref 39.0–52.0)
Hemoglobin: 7.7 g/dL — ABNORMAL LOW (ref 13.0–17.0)
Immature Granulocytes: 5 %
Lymphocytes Relative: 6 %
Lymphs Abs: 1.2 10*3/uL (ref 0.7–4.0)
MCH: 27.4 pg (ref 26.0–34.0)
MCHC: 29.6 g/dL — ABNORMAL LOW (ref 30.0–36.0)
MCV: 92.5 fL (ref 80.0–100.0)
Monocytes Absolute: 1.8 10*3/uL — ABNORMAL HIGH (ref 0.1–1.0)
Monocytes Relative: 9 %
Neutro Abs: 14.9 10*3/uL — ABNORMAL HIGH (ref 1.7–7.7)
Neutrophils Relative %: 76 %
Platelets: 425 10*3/uL — ABNORMAL HIGH (ref 150–400)
RBC: 2.81 MIL/uL — ABNORMAL LOW (ref 4.22–5.81)
RDW: 22.6 % — ABNORMAL HIGH (ref 11.5–15.5)
WBC: 19.6 10*3/uL — ABNORMAL HIGH (ref 4.0–10.5)
nRBC: 0.5 % — ABNORMAL HIGH (ref 0.0–0.2)

## 2020-02-17 LAB — POCT I-STAT 7, (LYTES, BLD GAS, ICA,H+H)
Acid-Base Excess: 1 mmol/L (ref 0.0–2.0)
Bicarbonate: 25.8 mmol/L (ref 20.0–28.0)
Calcium, Ion: 1.18 mmol/L (ref 1.15–1.40)
HCT: 28 % — ABNORMAL LOW (ref 39.0–52.0)
Hemoglobin: 9.5 g/dL — ABNORMAL LOW (ref 13.0–17.0)
O2 Saturation: 96 %
Patient temperature: 97.8
Potassium: 4.9 mmol/L (ref 3.5–5.1)
Sodium: 137 mmol/L (ref 135–145)
TCO2: 27 mmol/L (ref 22–32)
pCO2 arterial: 41.9 mmHg (ref 32.0–48.0)
pH, Arterial: 7.396 (ref 7.350–7.450)
pO2, Arterial: 81 mmHg — ABNORMAL LOW (ref 83.0–108.0)

## 2020-02-17 LAB — GLUCOSE, CAPILLARY
Glucose-Capillary: 118 mg/dL — ABNORMAL HIGH (ref 70–99)
Glucose-Capillary: 139 mg/dL — ABNORMAL HIGH (ref 70–99)
Glucose-Capillary: 151 mg/dL — ABNORMAL HIGH (ref 70–99)
Glucose-Capillary: 171 mg/dL — ABNORMAL HIGH (ref 70–99)
Glucose-Capillary: 172 mg/dL — ABNORMAL HIGH (ref 70–99)
Glucose-Capillary: 173 mg/dL — ABNORMAL HIGH (ref 70–99)
Glucose-Capillary: 186 mg/dL — ABNORMAL HIGH (ref 70–99)

## 2020-02-17 LAB — COOXEMETRY PANEL
Carboxyhemoglobin: 2.2 % — ABNORMAL HIGH (ref 0.5–1.5)
Methemoglobin: 1.2 % (ref 0.0–1.5)
O2 Saturation: 56.6 %
Total hemoglobin: 5.9 g/dL — CL (ref 12.0–16.0)

## 2020-02-17 LAB — MAGNESIUM: Magnesium: 2.5 mg/dL — ABNORMAL HIGH (ref 1.7–2.4)

## 2020-02-17 LAB — LACTATE DEHYDROGENASE: LDH: 443 U/L — ABNORMAL HIGH (ref 98–192)

## 2020-02-17 LAB — HEPARIN LEVEL (UNFRACTIONATED): Heparin Unfractionated: <0.1 IU/mL — ABNORMAL LOW (ref 0.30–0.70)

## 2020-02-17 NOTE — Anesthesia Preprocedure Evaluation (Addendum)
Anesthesia Evaluation  Patient identified by MRN, date of birth, ID bandGeneral Assessment Comment:Please see previous charts  S/p trach/peg 02/04/2020  Reviewed: Allergy & Precautions, H&P , Patient's Chart, lab work & pertinent test results  Airway Mallampati: Trach       Dental   Pulmonary sleep apnea ,    Pulmonary exam normal        Cardiovascular hypertension, + CAD, + CABG and +CHF  + pacemaker + Cardiac Defibrillator  Rhythm:Regular Rate:Normal  impella    Neuro/Psych PSYCHIATRIC DISORDERS Anxiety Depression Bipolar Disorder negative neurological ROS     GI/Hepatic Neg liver ROS, GERD  ,  Endo/Other  diabetes  Renal/GU Renal disease  negative genitourinary   Musculoskeletal  (+) Arthritis ,   Abdominal (+)  Abdomen: soft.    Peds negative pediatric ROS (+)  Hematology negative hematology ROS (+)   Anesthesia Other Findings   Reproductive/Obstetrics negative OB ROS                            Anesthesia Physical  Anesthesia Plan  ASA: IV  Anesthesia Plan: General   Post-op Pain Management:    Induction: Intravenous  PONV Risk Score and Plan: 2  Airway Management Planned: Tracheostomy  Additional Equipment: Arterial line, PA Cath, CVP, TEE, 3D TEE and Ultrasound Guidance Line Placement  Intra-op Plan:   Post-operative Plan: Post-operative intubation/ventilation  Informed Consent: I have reviewed the patients History and Physical, chart, labs and discussed the procedure including the risks, benefits and alternatives for the proposed anesthesia with the patient or authorized representative who has indicated his/her understanding and acceptance.     Dental advisory given  Plan Discussed with: CRNA and Surgeon  Anesthesia Plan Comments: (Critically ill male, who has been in the ICU greater than 4 weeks. Multiple bring backs for washouts, and sternal closure here for  impella removal Lab Results      Component                Value               Date                      WBC                      19.6 (H)            02/17/2020                HGB                      9.5 (L)             02/17/2020                HCT                      28.0 (L)            02/17/2020                MCV                      92.5                02/17/2020                PLT  425 (H)             02/17/2020           Lab Results      Component                Value               Date                      NA                       135                 02/17/2020                K                        4.9                 02/17/2020                CO2                      25                  02/17/2020                GLUCOSE                  171 (H)             02/17/2020                BUN                      44 (H)              02/17/2020                CREATININE               1.21                02/17/2020                CALCIUM                  8.1 (L)             02/17/2020                GFRNONAA                 >60                 02/17/2020                GFRAA                    >60                 03/08/2019           ECHO 02/04/20: IMPRESSIONS  1. Severe biventricular failure. No significant effusion. Pacing wire in  RV. Markedly abnormal septal motion. Impellar device about 4.6 cm distal  to arotic annulus with no entrapment.  FINDINGS  Additional Comments: Severe biventricular failure. No significant  effusion. Pacing wire in RV. Markedly abnormal septal  motion. Impellar  device about 4.6 cm distal to arotic annulus with no entrapment. )       Anesthesia Quick Evaluation

## 2020-02-17 NOTE — Progress Notes (Signed)
[facility] Progress Note    Assessment/ Plan:    36M with familial cardiomyopathy EF20% s/p redo CABG 93/81/01 complicated by persistent shock, multiple attempts at sternal closure with formation of chest hematoma and tamponade, Afib with RVR, and AKI requiring CRRT.    # Anuric AKI/CKD stage III- presumably due to ischemic ATN in setting of cardiogenic shock s/p CABG.  CRRT started 01/16/20.  All fluids 4K/2.5Ca.  Right femoral HD catheter placed 01/16/20 switched to [place] on 01/28/20.  - continue CRRT--> goal net neg 0 to 104m/hr neg - no change to CRRT settings today - suggest involving palliative care, consideration of transition to comfort measures  # CAD s/p CABG complicated by cardiogenic shock and post-op hematoma. secondary closure on 12/23 then had to be re opened.  Went to the OR for washout on 12/28, bedside washout 02/01/19.  OR 02/15/2020 for closure, then had emergent reopening at the bedside 02/23/2020 d/t anterior chest hematoma again. OR for chest closure on 1/10.  Plastic surgery partial closure 1/17  # Cardiogenic shock- underlying familial cardiomyopathy s/p redo CABG, EF ~20%. Remains on pressors, inotrope, Impella. Midodrine 20 tid  # ABLA- s/p postoperative bleeding into chest cavity s/p re-exploration for tamponade/hematoma. Received multiple blood products  # Atrial fibrillation - s/p DCCV on 12/16.  On amiodarone.   # VDRF- per CHF/PCCMsvc. Trach/peg 1/10  #-Leukocytosis/ new fever: per primary  # R loculated pleural effusion: for CT chest on 1/6 with contrast   #NSVT: per CV/EP  Subjective:      Remains on CRRT, K 4.5, P 2.8  All 4K, gentle UF as able  impella weening being attempted  Remains on NE and dobutamine  Not clotting of CRRT   Objective:   BP 93/67   Pulse 86   Temp 98 F (36.7 C) (Axillary)   Resp 16   Ht _0  (1.905 m)   Wt 97.1 kg   SpO2 100%   BMI 26.76 kg/m   Intake/Output Summary (Last 24 hours) at  02/17/2020 1206 Last data filed at 02/17/2020 1119 Gross per 24 hour  Intake 5220.14 ml  Output 6547 ml  Net -1326.86 ml   Weight change:   Physical Exam:  Gen: adult male  CVS: normal sinus - RRR CHEST: woundvac foam in place, R [place] Impella Resp: clear but diminished anteriorly trach Abd: soft ND Ext:1-2+ edema hips and legs Neuro: awake and follows some simple commands ACCESS: R IJ nontunneled HD cath  Imaging: DG Chest 1 View  Result Date: 02/17/2020 CLINICAL DATA:  Respiratory failure.  Tracheostomy present EXAM: CHEST  1 VIEW COMPARISON:  February 16, 2020 FINDINGS: Tracheostomy catheter tip is 6.2 cm above the carina. There is an Impella device, unchanged in position. There are bilateral chest tubes and a mediastinal drain. There is a pacemaker with lead tip attached to the right atrium. No pneumothorax. There is opacity in the right apex region with overlying skin staples. There is ill-defined airspace opacity in the left lower lobe with small left pleural effusion. There is a small right pleural effusion. There is stable cardiomegaly with pulmonary venous hypertension. No adenopathy. No bone lesions. IMPRESSION: Tube and catheter positions as described without pneumothorax. Suspect hematoma right apex. Atelectatic change left lower lobe. Small pleural effusions bilaterally. No new opacity. Stable cardiac prominence with pulmonary vascular congestion. Electronically Signed   By: WLowella GripIII M.D.   On: 02/17/2020 07:58   DG CHEST PORT 1 VIEW  Result Date: 02/16/2020  CLINICAL DATA:  Tracheostomy. Open-heart surgery. Respiratory failure. EXAM: PORTABLE CHEST 1 VIEW COMPARISON:  02/13/2020. FINDINGS: Tracheostomy tube, right IJ line, right PICC line, bilateral chest tubes, mediastinal drainage catheter in stable position. Impella device in stable position. Cardiac pacer in stable position. Stable cardiomegaly. Low lung volumes with diffuse bilateral interstitial prominence again  noted. Interstitial edema and/or pneumonitis could present in this fashion. Small right pleural effusion. Persistent right apical pleural density noted. This could represent loculated effusion. No pneumothorax. IMPRESSION: 1. Lines and tubes including bilateral chest tubes in stable position. No pneumothorax. 2. Impella device and cardiac pacer stable position. Stable cardiomegaly. 3. Low lung volumes with diffuse bilateral interstitial prominence again noted. Interstitial edema and/or pneumonitis could present in this fashion. Small right pleural effusion. Stable right apical pleural based density. This could represent loculated fluid. Similar findings noted on prior exam. Electronically Signed   By: [place]   On: 02/16/2020 08:03    Labs: BMET Recent Labs  Lab 02/13/20 1550 02/11/2020 4665 02/01/2020 1533 02/15/20 0254 02/15/20 1520 02/16/20 0310 02/16/20 1742 02/17/20 0414 02/17/20 0447  NA 132* 133* 135 136 136 134* 133* 135 137  K 4.3 4.3 4.7 4.9 4.0 4.5 4.9 4.8 4.9  CL 100 101 102 102 104 101 102 101  --   CO2 _0 21* _1 --   GLUCOSE 206* 121* 127* 143* 186* 170* 212* 189*  --   BUN 34* 37* 31* 33* 32* 35* 45* 41*  --   CREATININE 1.33* 1.30* 1.35* 1.24 1.29* 1.24 1.41* 1.29*  --   CALCIUM 8.0* 8.2* 7.9* 8.0* 8.3* 8.2* 7.9* 8.0*  --   PHOS 1.6* 2.0* 2.9 2.9 2.7  --  3.5 2.8  --    CBC Recent Labs  Lab 02/08/2020 0338 02/15/20 0254 02/16/20 0310 02/17/20 0414 02/17/20 0447  WBC 15.4* 18.9* 18.2* 19.6*  --   NEUTROABS 11.9* 14.4* 14.5* 14.9*  --   HGB 9.1* 8.3* 7.5* 7.7* 9.5*  HCT 29.6* 26.4* 24.3* 26.0* 28.0*  MCV 88.6 89.5 89.0 92.5  --   PLT 440* 516* 447* 425*  --     Medications:    . vitamin C  500 mg Per Tube TID  . aspirin  324 mg Per Tube Daily  . atorvastatin  80 mg Per Tube Daily  . busPIRone  10 mg Per Tube TID  . chlorhexidine gluconate (MEDLINE KIT)  15 mL Mouth Rinse BID  . Chlorhexidine Gluconate Cloth  6 each Topical Daily  .  darbepoetin (ARANESP) injection - NON-DIALYSIS  100 mcg Subcutaneous Q Sat-1800  . docusate  100 mg Per Tube BID  . feeding supplement (PROSource TF)  45 mL Per Tube TID  . fiber  1 packet Per Tube BID  . FLUoxetine  40 mg Per Tube BID  . HYDROmorphone  4 mg Oral Q6H  . insulin aspart  1-3 Units Subcutaneous Q4H  . insulin aspart  6 Units Subcutaneous Q4H  . insulin detemir  18 Units Subcutaneous Q12H  . LORazepam  0.25 mg Intravenous Q4H  . mouth rinse  15 mL Mouth Rinse 10 times per day  . melatonin  5 mg Per Tube Daily  . mexiletine  150 mg Oral Q8H  . midodrine  20 mg Per Tube TID WC  . multivitamin  1 tablet Per Tube QHS  . nutrition supplement (JUVEN)  1 packet Per Tube BID BM  . pantoprazole (PROTONIX) IV  40 mg  Intravenous Q12H  . polyethylene glycol  17 g Per Tube Daily  . sodium chloride flush  10-40 mL Intracatheter Q12H  . thiamine  100 mg Per Tube Daily  . valproic acid  250 mg Per Tube BID      Rexene Agent  02/17/2020, 12:06 PM

## 2020-02-17 NOTE — Progress Notes (Signed)
Patient ID: Corey Palmer, male   DOB: May 17, 1970, 51 y.o.   MRN: [id number]  This NP visited patient at the bedside as a follow up to last week's [place], for palliative medicine  needs and emotional support.    Medical records reviewed   Spoke to [facility] by telephone for continued support and medical updates at her request.  Vonna Kotyk that she feels well supported by the staff, she has had support from Dorian Pod the chaplain with the palliative medicine team.  She tells me that she spoke to Dr. Julien Girt this morning.  She appreciates all support and is grateful for the care her husband is receiving        She understands the seriousness of her husband's medical situation however she remains optimistic at this time.  Discussed with wife the importance of continued conversation with her family and the  medical providers regarding overall plan of care and treatment options,  ensuring decisions are within the context of the patients values and GOCs.  Questions and concerns addressed    This nurse practitioner informed  the wife   that I will be out of the hospital until Monday morning.  If the patient is still hospitalized I will follow-up at that time.  Call palliative medicine team phone # [phone] with questions or concerns in the interim.  Total time spent on the unit was 20 minutes  Greater than 50% of the time was spent in counseling and coordination of care  Wadie Lessen NP  Palliative Medicine Team Team Phone # [phone] Pager [phone]

## 2020-02-17 NOTE — Progress Notes (Addendum)
Patient ID: Corey Palmer, male   DOB: 09/06/1970, 50 y.o.   MRN: [id number]     Advanced Heart Failure Rounding Note  PCP-Cardiologist: No primary care provider on file.   Subjective:    - 12/7 Torsades -ICD shock x1.  - 12/11 Impella 5.5 placed.  - 12/12 Impella repositioned in OR - 12/15 CABG x 4 with LIMA-LAD, SVG-PDA/PLV, SVG-OM - 12/16 DCCV afib.  Back to OR to re-open chest and again to evacuate hematoma.  - 12/19 CVVH initiated  - 12/23 Chest closed - 12/24 Chest reopened - 12/26 RAP back into NSR - 12/27 back in atrial fibrillation, unable to rapid atrial pace out.   - 12/28 To OR, chest partially closed and wound vac placed.  DCCV to NSR.  - 12/31 Antibiotics stopped. Swan removed, HD catheter moved from right femoral to right IJ.  - 1/5 To OR for chest closure. Chest reopened emergently at bedside again to evacuate hematoma  - 1/10 To OR: omental flap closure of chest with wound vac, tracheostomy, G-tube, J-tube.    - 1/12 Plastics and EP consulted.  - 1/13 Palliative Care consulted.  - 1/15 Went into VT, Shock 200 J and amiodarone bolus 150.   - 1/16: Milrinone stopped.    - 1/17: Partial closure of chest in OR - 1/18 Switched to dobutamine 4 mcg  Dobutamine 4 mcg + Norepi 30 mcg. CO-OX 56%  Remains on CVVH pulling 50 mL/hr  Sedated on vent. FiO2 30%  Impella 5.5 P-3 Flow 1.8   LDH 383=>410 =>412 => 437=> 403 => 409=> 451=>451=>431=>pending.    Objective:   Weight Range: 97.1 kg Body mass index is 26.76 kg/m.   Vital Signs:   Temp:  [97.8 F (36.6 C)-98.6 F (37 C)] 97.8 F (36.6 C) (01/20 0400) Pulse Rate:  [73-93] 87 (01/20 0714) Resp:  [14-24] 24 (01/20 0714) BP: (90-107)/(62-79) 103/79 (01/20 0714) SpO2:  [94 %-100 %] 98 % (01/20 0714) Arterial Line BP: (89-116)/(46-63) 101/56 (01/20 0700) FiO2 (%):  [30 %-40 %] 30 % (01/20 0714) Last BM Date: 02/16/20  Weight change: Filed Weights   02/13/20 0500 02/15/20 0437 02/16/20 0444  Weight: 98 kg  97.5 kg 97.1 kg    Intake/Output:   Intake/Output Summary (Last 24 hours) at 02/17/2020 0727 Last data filed at 02/17/2020 0700 Gross per 24 hour  Intake 5448.6 ml  Output 6987 ml  Net -1538.4 ml      Physical Exam  CVP 11-12  General: Intubated HEENT: normal Neck: supple. JVP difficult to assess.  Carotids 2+ bilat; no bruits. No lymphadenopathy or thryomegaly appreciated. Cor: PMI nondisplaced. Regular rate & rhythm. No rubs, gallops or murmurs.Open sternal wound. R axillary impella  Lungs: Coarse throughout Abdomen: soft, nontender, nondistended. No hepatosplenomegaly. No bruits or masses. Good bowel sounds. Extremities: no cyanosis, clubbing, rash, edema Neuro: Intubated/sedated  Telemetry   SR 80s personally reviewed,    Labs    CBC Recent Labs    02/16/20 0310 02/17/20 0414  WBC 18.2* 19.6*  NEUTROABS 14.5* PENDING  HGB 7.5* 7.7*  HCT 24.3* 26.0*  MCV 89.0 92.5  PLT 447* 341*   Basic Metabolic Panel Recent Labs    02/15/20 0254 02/15/20 1520 02/16/20 0310 02/16/20 1742 02/17/20 0414  NA 136   < > 134* 133* 135  K 4.9   < > 4.5 4.9 4.8  CL 102   < > 101 102 101  CO2 24   < > 24 22 24  GLUCOSE 143*   < > 170* 212* 189*  BUN 33*   < > 35* 45* 41*  CREATININE 1.24   < > 1.24 1.41* 1.29*  CALCIUM 8.0*   < > 8.2* 7.9* 8.0*  MG 2.4  --  2.3  --   --   PHOS 2.9   < >  --  3.5 2.8   < > = values in this interval not displayed.   Liver Function Tests Recent Labs    02/16/20 0310 02/16/20 1742 02/17/20 0414  AST 57*  --   --   ALT 51*  --   --   ALKPHOS 261*  --   --   BILITOT 1.2  --   --   PROT 5.8*  --   --   ALBUMIN 1.8* 1.7* 1.8*   No results for input(s): LIPASE, AMYLASE in the last 72 hours. Cardiac Enzymes No results for input(s): CKTOTAL, CKMB, CKMBINDEX, TROPONINI in the last 72 hours.  BNP: BNP (last 3 results) Recent Labs    01/26/2020 1619  BNP 577.5*    ProBNP (last 3 results) No results for input(s): PROBNP in the last  8760 hours.   D-Dimer No results for input(s): DDIMER in the last 72 hours. Hemoglobin A1C No results for input(s): HGBA1C in the last 72 hours. Fasting Lipid Panel No results for input(s): CHOL, HDL, LDLCALC, TRIG, CHOLHDL, LDLDIRECT in the last 72 hours. Thyroid Function Tests No results for input(s): TSH, T4TOTAL, T3FREE, THYROIDAB in the last 72 hours.  Invalid input(s): FREET3  Other results:   Imaging    No results found.   Medications:     Scheduled Medications: . vitamin C  500 mg Per Tube TID  . aspirin  324 mg Per Tube Daily  . atorvastatin  80 mg Per Tube Daily  . busPIRone  10 mg Per Tube TID  . chlorhexidine gluconate (MEDLINE KIT)  15 mL Mouth Rinse BID  . Chlorhexidine Gluconate Cloth  6 each Topical Daily  . darbepoetin (ARANESP) injection - NON-DIALYSIS  100 mcg Subcutaneous Q Sat-1800  . docusate  100 mg Per Tube BID  . feeding supplement (PROSource TF)  45 mL Per Tube TID  . fiber  1 packet Per Tube BID  . FLUoxetine  40 mg Per Tube BID  . HYDROmorphone  4 mg Oral Q6H  . insulin aspart  1-3 Units Subcutaneous Q4H  . insulin aspart  6 Units Subcutaneous Q4H  . insulin detemir  18 Units Subcutaneous Q12H  . LORazepam  0.25 mg Intravenous Q4H  . mouth rinse  15 mL Mouth Rinse 10 times per day  . melatonin  5 mg Per Tube Daily  . mexiletine  150 mg Oral Q8H  . midodrine  20 mg Per Tube TID WC  . multivitamin  1 tablet Per Tube QHS  . nutrition supplement (JUVEN)  1 packet Per Tube BID BM  . pantoprazole (PROTONIX) IV  40 mg Intravenous Q12H  . polyethylene glycol  17 g Per Tube Daily  . sodium chloride flush  10-40 mL Intracatheter Q12H  . thiamine  100 mg Per Tube Daily  . valproic acid  250 mg Per Tube BID    Infusions: .  prismasol BGK 4/2.5 500 mL/hr at 02/17/20 0522  .  prismasol BGK 4/2.5 200 mL/hr at 02/16/20 1902  . sodium chloride Stopped (01/29/20 0955)  . sodium chloride    . amiodarone 30 mg/hr (02/17/20 0700)  . dexmedetomidine  (PRECEDEX)  IV infusion 1 mcg/kg/hr (02/17/20 0700)  . dextrose    . DOBUTamine 4 mcg/kg/min (02/17/20 0700)  . electrolyte-A Stopped (02/03/20 4656)  . epinephrine Stopped (02/08/20 0651)  . feeding supplement (PIVOT 1.5 CAL) 1,000 mL (02/17/20 0224)  . impella catheter heparin 50 unit/mL in dextrose 5%    . HYDROmorphone 4 mg/hr (02/17/20 0700)  . lactated ringers 10 mL/hr at 02/17/20 0700  . norepinephrine (LEVOPHED) Adult infusion 30 mcg/min (02/17/20 0700)  . prismasol BGK 4/2.5 1,500 mL/hr at 02/17/20 0523  . vasopressin Stopped (02/15/20 0841)    PRN Medications: sodium chloride, Place/Maintain arterial line **AND** sodium chloride, artificial tears, bisacodyl **OR** bisacodyl, dextrose, docusate, heparin, heparin, HYDROmorphone, lip balm, metoprolol tartrate, midazolam, polyethylene glycol, sodium chloride flush     Assessment/Plan   1. Cardiogenic shock/acute on chronic systolic CHF: Initially nonischemic cardiomyopathy.  Possible familial cardiomyopathy as both parents had cardiomyopathy and died at around 83. However, [employer] gene testing did not show any common mutation for cardiomyopathy.  However, this admission noted to have severe 3 vessel disease so suspect component of ischemic cardiomyopathy.  [employer] Jude ICD.  Echo in 8/20 with EF 15% and mildly decreased RV function. Echo this admission with EF < 20%, moderate LV dilation, RV mildly reduced, severe LAE, no significant MR. Low output HF with markedly low EF.  He has a long history of cardiomyopathy (20 yrs), tends to minimize symptoms.  Cardiorenal syndrome with creatinine up to 2.2,  stabilized on milrinone and Impella 5.5. SCr 1.44 day of CABG. CABG 12/15.  Post-op shock, back to OR 12/16 to open chest (chest wall compartment syndrome) and again to evacuate hematoma.  TEE post-op with severe RV dysfunction.  CVVHD initiated 12/19 for volume removal in setting of rising Scr/decreased UOP. Suspect ATN. S/p chest closure 12/23.  Hemodynamics much worse on 12/24 with rising pressor demands. Co-ox 36%.  Bedside echo severe biventricular dysfunction with marked septal bounce. Chest re-opened 12/24.  Was atrial paced back to NSR on 12/26 with improvement in hemodynamics but back in atrial fibrillation on 12/27 and unable to pace out, DCCV to NSR on 12/28. Went back to OR 1/5 for chest closure but several hours later required emergent re-opening of chest at bedside to evacuate hematoma. To OR again 1/10 with closure of chest with omental flap with G & J tube.  1/17 partial closure.  - Remains Dobutamine 4 mcg + Norepi 30 mcg. Impella P-3 with co-ox 56%  - Continue CVVH.  2. Atrial fibrillation: H/o PAF.  He was on dofetilide in the remote past but this was stopped due to noncompliance.  He had an upper GI bleed from antral ulcers in 3/12. He was seen by GI and was deemed safe to restart anticoagulation as long as he remains on a PPI.  No apparent recurrence of AF until just prior to this admission, was cardioverted back to NSR in ER but went back into atrial fibrillation.  Post-op afib, DCCV to NSR/BiV pacing am 12/16 -> back to AF. Rapid atrial pacing back to SR  - Maintaining NSR - Continue amio 30 mg per hour.   - Getting heparin through impella purge 3. CAD:   Cath this admission with severe 3 vessel disease. CABG x 4 on 12/15.  - Continue atorvastatin, ASA.  4. Acute on chronic hypoxemic respiratory failure: Vent per CCM.  Has tracheostomy.  - CCM following.  5. AKI on CKD stage 3: Likely ATN/cardiorenal. Anuric. On CVVH.    - Pulling  50 mL/hr  6. Diabetes: Insulin.  7. Hyponatremia: Resolved with CVVH.  8. Torsades/ICD shock: 12/7/212 in hospital event, in setting of severe hypokalemia and hypomagnesemia.   9. Gout: h/o gout. Complained of rt knee pain c/w previous flares - treated w/ prednisone burst (completed).  10: ID: completed abx for PNA.  Afebrile.  Cultures NGTD. WBCs 18, will repeat surveillance cultures.     11. FEN: On TFS s via J-tube. 12. Anemia: Stable 7.7  today.  Transfuse < 7.5.  13. Stage II Pressure Ulcer- sacrum -Continue to reposition every 2 hours R/L  14. Pleural Effusion  - loculated rt apical pleural effusion noted on imaging.  - continue UF through CVVH  - PCCM following  15. VT - RUN of NSVT required amio 150 bolus, followed by shock 200J.   - EP consulted, started mexitil.   - keep K > 4.0 and Mg > 2.0 -No VT overnight.  16. Abd pain - G-J tube flushed at bedside  - CT C/A/P showed no source for the abdominal pain - TFs ongoing.    Darrick Grinder, NP 02/17/2020 7:27 AM   Patient seen with NP, agree with the above note.   VT quiescent on amiodarone gtt + mexiletine.   Today, he is on dobutamine 4 + NE 30, off vasopressin.Co-ox 57%. CVP 8, on CVVH with UF net 50 cc/hr yesterday.   Impella P3 for 24 hrs. Getting heparin via purge only.   General: NAD Neck: Tracheostomy. No JVD, no thyromegaly or thyroid nodule.  Lungs: Decreased at bases. CV: Nondisplaced PMI.  Heart regular S1/S2, no S3/S4, no murmur.  No peripheral edema.   Abdomen: Soft, nontender, no hepatosplenomegaly, no distention.  Skin: Intact without lesions or rashes.  Neurologic: Will wake up and follow commands. Extremities: No clubbing or cyanosis.  HEENT: Normal.   At this point, will continue to focus on weaning Impella. He is not a candidate for LVAD or transplant at this point with RRT, so if we cannot wean and remove Impella there is really no chance for recovery. Co-ox 57% currently. Impella has been a P3 for the last day.  - Continue dobutamine 4 + NE 30, wean NE aiming for MAP > 60 and will leave dobutamine going.  - With CVP 8, can aim for I/Os even to gently via CVVH.  - If he remains stable on Impella P3, we will need to try removing the Impella.  Will need discussion with family, if he does not do well with Impella out would not replace given overall poor prognosis at this point  (inotrope dependent and requiring RRT). Will discuss with Dr. [name] and will need to talk with patient's family.  - Will also discuss restarting systemic heparin at this point with Dr. [name].   VT quiescent,continue amiodarone gtt andmexiletine 150 mg q8 hrs.   TFs ongoing via [place] tube.  CRITICAL CARE Performed by: Loralie Champagne  Total critical care time: 40 minutes  Critical care time was exclusive of separately billable procedures and treating other patients.  Critical care was necessary to treat or prevent imminent or life-threatening deterioration.  Critical care was time spent personally by me on the following activities: development of treatment plan with patient and/or surrogate as well as nursing, discussions with consultants, evaluation of patient's response to treatment, examination of patient, obtaining history from patient or surrogate, ordering and performing treatments and interventions, ordering and review of laboratory studies, ordering and review of radiographic studies, pulse oximetry and re-evaluation  of patient's condition.  Loralie Champagne 02/17/2020 7:59 AM

## 2020-02-17 NOTE — Progress Notes (Incomplete)
02/17/2020   I have seen and evaluated the patient for postoperative respiratory failure.  S:  No events, on P3. Discussion regarding potential impella removal tomorrow. Remains on CRRT. More sedated this AM Wife at bedside.  O: Blood pressure 93/67, pulse 86, temperature 98 F (36.7 C), temperature source Axillary, resp. rate 16, height 6\' 3"  (1.905 m), weight 97.1 kg, SpO2 100 %.  Ill appearing man lying in bed Sternal wound with vac and binders in place Abd soft, +BS PEGJ in place Ext with muscle wasting, anasarca  Chemistries look okay ABG looks good WBC up slightly Hgb stable in 7s SvO2 56% LDH stable   CXR stable edema and R loculated effusions.  A:  Postoperative hypoxemic respiratory failure, unable to wean from ventilator now post tracheostomy. Chronic small loculated R effusions Recurrent Vtach on Mexiletine Persistent mixed cardiogenic and vasoplegic shock Cardiorenal on CRRT Prolonged QT Profound muscular deconditioning Persistent issues with anxiety, pain, agitation Afib PTA DM with hyperglycemia  P:  Continue midodrine current dosing, inotropes per CHF team Tolerated TC for about an hour 1/19 PS vs. TC during day Fluid removal per CHF/TCTS/nephro discussion Basal bolus insulin Antiarrythmics per CHF and EP team Possible impella removal tomorrow then will work toward more mobility   Patient critically ill due to respiratory failure Interventions to address this today mechanical ventilation titration Risk of deterioration without these interventions is high  I personally spent *** minutes providing critical care not including any separately billable procedures  Erskine Emery MD [facility] 02/17/2020 12:22 PM Personal pager: [phone] If unanswered, please page CCM On-call: [phone]

## 2020-02-17 NOTE — Progress Notes (Signed)
NAME:  Corey Palmer, MRN:  [id number], DOB:  02/12/1970, LOS: 66 ADMISSION DATE:  01/07/2020, CONSULTATION DATE: 01/08/2020 REFERRING MD: Aundra Dubin CHIEF COMPLAINT: Respiratory failure post Impella  HPI/Course in hospital  50 year old man admitted 12/1 with A. fib/RVR, torsades, cardiogenic shock [EF 15] requiring Impella placement, CABG x 4 c/b cardiac tamponade requiring chest reopening (now with VAC), AKI requiring initiation of CRRT.  Past Medical History:    has a past medical history of Anginal pain ([place]), Anxiety, Arthritis, Automatic implantable cardioverter-defibrillator in situ (2007), Bipolar disorder ([place]), CAD (coronary artery disease), Cancer ([place]) (2013), CHF (congestive heart failure) ([place]), Chondrocalcinosis of right knee (8/41/6606), Chronic systolic heart failure ([place]), Depression, Diabetes uncomplicated adult-type II, Dilated cardiomyopathy ([place]), Dyslipidemia, Dysrhythmia, GERD (gastroesophageal reflux disease), Gout, unspecified, Hepatic steatosis (2011), History of kidney stones, echocardiogram, Hypertension, Obesity (BMI 30.0-34.9), Pacemaker, Paroxysmal atrial fibrillation ([place]), Peptic ulcer, Shortness of breath, and Sleep apnea.  Significant Hospital Events:  12/2 Afib with RVR. Cardioverted in ED. AICD did not fire. Milrinone for low co ox.  12/7 ICD fired, Torsades- likely from low K of 2.8.  12/8 Cath- multivessel disease w/ low CO. Volume overloaded.  12/11 Underwent Impella 5.5 insertion for persistent symptoms of forward failure with low cardiac indices. 12/12 Back to the OR for displacement of Impella. Extubated.  12/15 CABG x4, with LIMA-LAD, SVG-PDA/PLV, SVG-OM 12/16 Emergently opened chest at bedside for tamponade, clot compressing right atrium  12/16 To OR for Exploration of chest due to bleeding  12/19 Started CRRT 12/23 Sternal Closure in OR 12/24 Worsening shock.  Chest reopened at bedside with improvement in hemodynamics.  No mediastinal  hematoma. 12/25 A flutter > rapid a paced to sinus rhythm.  Remains on high-dose pressors, inotropes 12/28 Weaning vasopressor requirements.  For chest washout today.  Back in atrial fibrillation, refractory to increased amiodarone and attempted overdrive pacing.  DCCV to NSR. Tolerating fluid removal via CRRT. 1/03 Appears euvolemic.  Adjusting glycemic control.  Chest still open so not weaning 1/04 Spiking fever, Vanco meropenem resumed after culture sent from blood and sputum. 1/05 Awaiting return to the OR for wound VAC dressing assessment fever curve and white blood cell count improving after antibiotics resumed the day prior, did have chest closed, developed tamponade physiology shortly after had to go back for emergent reopening of chest, return to intensive care and refractory shock 1/06 Wound VAC dressing replaced.  Still in shock.  Starting to wean sedation 1/07 Still on high-dose pressors, CRRT 1/15 Recurrent Vtach, shocked 1/18 Attempted pressure change to P 3 with addition of Dobutamine . MAP goal > 60 1/19 Ongoing attempts at impella wean, on PS 1/20 Stable on vent, following commands with sedation weaned, continued Impella wean attempt  Consults:  PCCM, nephrology, cardiology, cardiothoracic surgery, EP  Procedures:  R PICC 12/2 >> R axillary Impella 5.5 12/11 >> ETT 12/15 >> 1/10 R femoral arterial sheath 12/15 >> 12/31 R femoral HD cath 12/19 >> 12/31 Chest tube - right pleural, left pleural , mediastinal 12/23 >> 1/5 R IJ HD cath 12/31 >> R Radial A-Line 1/6 >> Chest tube - L pleural, R pleural, mediastinal 1/10 >> Trach 1/10 >> PEG 1/10 >>  Significant Diagnostic Tests:  12/8 LHC  Elevated R and L heart filling pressures with low CO, severe 3-vessel disease  Micro Data:  1/19 BCx2 >>  Antimicrobials:    Interval history:  No significant events overnight Remains on Impella P3, weaning as able per HF team Weaning pressors as able (  Levo 28 from 30), on  dobutamine CRRT UF 213mL/hr, goal net even-slightly negative Stable vent settings (PS 12/5, FiO2 30%) Following commands with weaned sedation per RN  Objective   Blood pressure 90/64, pulse 85, temperature 98 F (36.7 C), temperature source Axillary, resp. rate 16, height 6\' 3"  (1.905 m), weight 97.1 kg, SpO2 100 %. CVP:  [7 mmHg-17 mmHg] 14 mmHg  Vent Mode: PSV;CPAP FiO2 (%):  [30 %-40 %] 30 % PEEP:  [5 cmH20] 5 cmH20 Pressure Support:  [12 cmH20] 12 cmH20   Intake/Output Summary (Last 24 hours) at 02/17/2020 1005 Last data filed at 02/17/2020 1000 Gross per 24 hour  Intake 5377.61 ml  Output 6536 ml  Net -1158.39 ml   Filed Weights   02/13/20 0500 02/15/20 0437 02/16/20 0444  Weight: 98 kg 97.5 kg 97.1 kg   CVP:  [7 mmHg-17 mmHg] 14 mmHg  Examination: General: Chronically ill-appearing adult male, lying in bed in NAD HEENT: Anicteric sclera, dry mucous membranes. Pupils round/reactive. Trach in place. Neuro: Mildly sedated, generalized weakness. CV: RRR, R axillary Impella present. Open chest with [place] in place. CT x 3, dressings c/d/i. PULM: Breathing even on vent, minimally labored via trach. Minimal secretions. GI: Soft, nontender, nondistended. PEG in place. Normoactive BS.  Extremities: Bilateral symmetric 1+ pitting BLE edema. DP pulses present and equal. Skin: Warm and dry, no rashes noted.  Assessment & Plan:   Acute hypoxic and hypercapnic respiratory failure  Requiring mechanical ventilation following cardiac surgery, post tracheostomy, improved, biggest issues are deconditioning and continued shock, overall improved, tolerated PS - Vent settings stable at present (PS 12/5, FiO2 30%) - Continue ATC/wean as able  Persistent small right sided loculated pleural effusions Doubt contributing to resp failure, further management per TCTS - Intermittent CXR as indicated for monitoring  Cardiogenic shock S/p high risk CABG complicated by tamponade requiring open chest,  now s/p omental flap closure, vac on Impella wean with delayed chest closure - Continues on dobutamine - Continues on Levo, weaning as able - Impella remains in place, P3; per HF team, if continuing P3 may consider discontinuation  AFib with RVR Torsades Present PTA, c/b Torsades in the setting of hypokalemia/hypomagnesemia - Continues on amiodarone gtt - Continue antiarrhythmics per EP team - Replete electrolytes as indicated - Heparin via Impella purge, no other AC at present  Recurrrent Vtach NSVT 1/15 requiring defibrillation + amiodarone bolus - Continues on mexilitine - EP following  Vasoplegia Likely 2/2 prolonged critical illness - Continues on midodrine 20mg  TID  Dysphagia - S/p GJT placement - Continue TF via JT  Acute kidney injury on chronic kidney disease (CKD III) C/f cardiorenal syndrome AKI with c/f ATN vs. cardiorenal syndrome with CRRT initiation 12/19 - Oliguric, UOP 188mL/24H - Continues on CRRT - UF 259mL/hr today as pressures tolerate - Per HF team, goal net even to slightly negative  Anemia Likely mutifactorial in the setting of critical illness, multiple surgeries, renal dysfunction/CRRT - Trend CBC - Continue darbepoetin   Prolonged QT - Continue to monitor - Avoid QT-prolonging agents as able  Persistent agitation/anxiety/pain - Remains on Dilaudid gtt for pain control - Precedex for sedation, weaning PRN for assessment  DM with hyperglycemia - Off of insulin gtt - Continue basal insulin BID + standing insulin Q4H - SSI, sensitive scale Q4H  Profound muscular deconditioning postop due to prolonged critical illness - Reengage PT once clinically appropriate   Daily Goals Checklist  Pain/Anxiety/Delirium protocol (if indicated): Precedex/Dilaudid gtt, weaning as able VAP  protocol (if indicated): Bundle in place DVT prophylaxis:  Full dose ASA Nutrition Status: TF GI prophylaxis: Pantoprazole Glucose control: Insulin (Basal,  Standing, SSI) Mobility/therapy needs: Bedrest  Code Status: Full  Family Communication: Per primary Disposition: ICU   Critical care time: 35 minutes   Lestine Mount, PA-C [facility] 02/17/20 10:06 AM   Please see [email or web address] for pager details.

## 2020-02-18 ENCOUNTER — Encounter (HOSPITAL_COMMUNITY): Admission: EM | Disposition: E | Payer: Self-pay | Source: Home / Self Care | Attending: Cardiothoracic Surgery

## 2020-02-18 ENCOUNTER — Inpatient Hospital Stay (HOSPITAL_COMMUNITY): Payer: BC Managed Care – PPO

## 2020-02-18 ENCOUNTER — Inpatient Hospital Stay (HOSPITAL_COMMUNITY): Payer: BC Managed Care – PPO | Admitting: Anesthesiology

## 2020-02-18 DIAGNOSIS — J9601 Acute respiratory failure with hypoxia: Secondary | ICD-10-CM | POA: Diagnosis not present

## 2020-02-18 DIAGNOSIS — I5022 Chronic systolic (congestive) heart failure: Secondary | ICD-10-CM

## 2020-02-18 DIAGNOSIS — N179 Acute kidney failure, unspecified: Secondary | ICD-10-CM | POA: Diagnosis not present

## 2020-02-18 DIAGNOSIS — I2510 Atherosclerotic heart disease of native coronary artery without angina pectoris: Secondary | ICD-10-CM

## 2020-02-18 DIAGNOSIS — I5023 Acute on chronic systolic (congestive) heart failure: Secondary | ICD-10-CM | POA: Diagnosis not present

## 2020-02-18 DIAGNOSIS — R570 Cardiogenic shock: Secondary | ICD-10-CM | POA: Diagnosis not present

## 2020-02-18 DIAGNOSIS — I4891 Unspecified atrial fibrillation: Secondary | ICD-10-CM | POA: Diagnosis not present

## 2020-02-18 HISTORY — PX: TEE WITHOUT CARDIOVERSION: SHX5443

## 2020-02-18 HISTORY — PX: REMOVAL OF IMPELLA LEFT VENTRICULAR ASSIST DEVICE: SHX6556

## 2020-02-18 LAB — CBC
HCT: 25.6 % — ABNORMAL LOW (ref 39.0–52.0)
Hemoglobin: 7.5 g/dL — ABNORMAL LOW (ref 13.0–17.0)
MCH: 27.2 pg (ref 26.0–34.0)
MCHC: 29.3 g/dL — ABNORMAL LOW (ref 30.0–36.0)
MCV: 92.8 fL (ref 80.0–100.0)
Platelets: 502 10*3/uL — ABNORMAL HIGH (ref 150–400)
RBC: 2.76 MIL/uL — ABNORMAL LOW (ref 4.22–5.81)
RDW: 23.7 % — ABNORMAL HIGH (ref 11.5–15.5)
WBC: 31.7 10*3/uL — ABNORMAL HIGH (ref 4.0–10.5)
nRBC: 0.8 % — ABNORMAL HIGH (ref 0.0–0.2)

## 2020-02-18 LAB — RENAL FUNCTION PANEL
Albumin: 1.6 g/dL — ABNORMAL LOW (ref 3.5–5.0)
Albumin: 1.5 g/dL — ABNORMAL LOW (ref 3.5–5.0)
Anion gap: 9 (ref 5–15)
Anion gap: 10 (ref 5–15)
BUN: 42 mg/dL — ABNORMAL HIGH (ref 6–20)
BUN: 49 mg/dL — ABNORMAL HIGH (ref 6–20)
CO2: 24 mmol/L (ref 22–32)
CO2: 21 mmol/L — ABNORMAL LOW (ref 22–32)
Calcium: 8.3 mg/dL — ABNORMAL LOW (ref 8.9–10.3)
Calcium: 8.3 mg/dL — ABNORMAL LOW (ref 8.9–10.3)
Chloride: 102 mmol/L (ref 98–111)
Chloride: 99 mmol/L (ref 98–111)
Creatinine, Ser: 1.18 mg/dL (ref 0.61–1.24)
Creatinine, Ser: 1.68 mg/dL — ABNORMAL HIGH (ref 0.61–1.24)
GFR, Estimated: >60 mL/min (ref >60)
GFR, Estimated: 49 mL/min — ABNORMAL LOW (ref >60)
Glucose, Bld: 65 mg/dL — ABNORMAL LOW (ref 70–99)
Glucose, Bld: 211 mg/dL — ABNORMAL HIGH (ref 70–99)
Phosphorus: 2.1 mg/dL — ABNORMAL LOW (ref 2.5–4.6)
Phosphorus: 5 mg/dL — ABNORMAL HIGH (ref 2.5–4.6)
Potassium: 4.6 mmol/L (ref 3.5–5.1)
Potassium: 6.3 mmol/L (ref 3.5–5.1)
Sodium: 135 mmol/L (ref 135–145)
Sodium: 130 mmol/L — ABNORMAL LOW (ref 135–145)

## 2020-02-18 LAB — POCT I-STAT 7, (LYTES, BLD GAS, ICA,H+H)
Acid-Base Excess: 2 mmol/L (ref 0.0–2.0)
Acid-Base Excess: 0 mmol/L (ref 0.0–2.0)
Acid-base deficit: 8 mmol/L — ABNORMAL HIGH (ref 0.0–2.0)
Acid-base deficit: 5 mmol/L — ABNORMAL HIGH (ref 0.0–2.0)
Bicarbonate: 27.5 mmol/L (ref 20.0–28.0)
Bicarbonate: 26.2 mmol/L (ref 20.0–28.0)
Bicarbonate: 19.5 mmol/L — ABNORMAL LOW (ref 20.0–28.0)
Bicarbonate: 22.8 mmol/L (ref 20.0–28.0)
Calcium, Ion: 1.25 mmol/L (ref 1.15–1.40)
Calcium, Ion: 1.19 mmol/L (ref 1.15–1.40)
Calcium, Ion: 1.14 mmol/L — ABNORMAL LOW (ref 1.15–1.40)
Calcium, Ion: 1.38 mmol/L (ref 1.15–1.40)
HCT: 25 % — ABNORMAL LOW (ref 39.0–52.0)
HCT: 26 % — ABNORMAL LOW (ref 39.0–52.0)
HCT: 28 % — ABNORMAL LOW (ref 39.0–52.0)
HCT: 27 % — ABNORMAL LOW (ref 39.0–52.0)
Hemoglobin: 8.5 g/dL — ABNORMAL LOW (ref 13.0–17.0)
Hemoglobin: 8.8 g/dL — ABNORMAL LOW (ref 13.0–17.0)
Hemoglobin: 9.5 g/dL — ABNORMAL LOW (ref 13.0–17.0)
Hemoglobin: 9.2 g/dL — ABNORMAL LOW (ref 13.0–17.0)
O2 Saturation: 96 %
O2 Saturation: 100 %
O2 Saturation: 99 %
O2 Saturation: 99 %
Patient temperature: 37.5
Patient temperature: 101
Potassium: 4.5 mmol/L (ref 3.5–5.1)
Potassium: 4.9 mmol/L (ref 3.5–5.1)
Potassium: 6.6 mmol/L (ref 3.5–5.1)
Potassium: 5.6 mmol/L — ABNORMAL HIGH (ref 3.5–5.1)
Sodium: 135 mmol/L (ref 135–145)
Sodium: 136 mmol/L (ref 135–145)
Sodium: 130 mmol/L — ABNORMAL LOW (ref 135–145)
Sodium: 132 mmol/L — ABNORMAL LOW (ref 135–145)
TCO2: 29 mmol/L (ref 22–32)
TCO2: 28 mmol/L (ref 22–32)
TCO2: 21 mmol/L — ABNORMAL LOW (ref 22–32)
TCO2: 24 mmol/L (ref 22–32)
pCO2 arterial: 45.3 mmHg (ref 32.0–48.0)
pCO2 arterial: 50.4 mmHg — ABNORMAL HIGH (ref 32.0–48.0)
pCO2 arterial: 51.6 mmHg — ABNORMAL HIGH (ref 32.0–48.0)
pCO2 arterial: 56.7 mmHg — ABNORMAL HIGH (ref 32.0–48.0)
pH, Arterial: 7.391 (ref 7.350–7.450)
pH, Arterial: 7.324 — ABNORMAL LOW (ref 7.350–7.450)
pH, Arterial: 7.189 — CL (ref 7.350–7.450)
pH, Arterial: 7.219 — ABNORMAL LOW (ref 7.350–7.450)
pO2, Arterial: 87 mmHg (ref 83.0–108.0)
pO2, Arterial: 415 mmHg — ABNORMAL HIGH (ref 83.0–108.0)
pO2, Arterial: 186 mmHg — ABNORMAL HIGH (ref 83.0–108.0)
pO2, Arterial: 181 mmHg — ABNORMAL HIGH (ref 83.0–108.0)

## 2020-02-18 LAB — BASIC METABOLIC PANEL
Anion gap: 13 (ref 5–15)
BUN: 51 mg/dL — ABNORMAL HIGH (ref 6–20)
CO2: 18 mmol/L — ABNORMAL LOW (ref 22–32)
Calcium: 8 mg/dL — ABNORMAL LOW (ref 8.9–10.3)
Chloride: 98 mmol/L (ref 98–111)
Creatinine, Ser: 1.76 mg/dL — ABNORMAL HIGH (ref 0.61–1.24)
GFR, Estimated: 46 mL/min — ABNORMAL LOW (ref >60)
Glucose, Bld: 201 mg/dL — ABNORMAL HIGH (ref 70–99)
Potassium: 6.6 mmol/L (ref 3.5–5.1)
Sodium: 129 mmol/L — ABNORMAL LOW (ref 135–145)

## 2020-02-18 LAB — COOXEMETRY PANEL
Carboxyhemoglobin: 3.1 % — ABNORMAL HIGH (ref 0.5–1.5)
Carboxyhemoglobin: 1.8 % — ABNORMAL HIGH (ref 0.5–1.5)
Methemoglobin: 0.8 % (ref 0.0–1.5)
Methemoglobin: 0.6 % (ref 0.0–1.5)
O2 Saturation: 73.7 %
O2 Saturation: 73.2 %
Total hemoglobin: 7 g/dL — ABNORMAL LOW (ref 12.0–16.0)
Total hemoglobin: 6.8 g/dL — CL (ref 12.0–16.0)

## 2020-02-18 LAB — GLUCOSE, CAPILLARY
Glucose-Capillary: 108 mg/dL — ABNORMAL HIGH (ref 70–99)
Glucose-Capillary: 113 mg/dL — ABNORMAL HIGH (ref 70–99)
Glucose-Capillary: 181 mg/dL — ABNORMAL HIGH (ref 70–99)
Glucose-Capillary: 185 mg/dL — ABNORMAL HIGH (ref 70–99)
Glucose-Capillary: 212 mg/dL — ABNORMAL HIGH (ref 70–99)
Glucose-Capillary: 68 mg/dL — ABNORMAL LOW (ref 70–99)
Glucose-Capillary: 83 mg/dL (ref 70–99)

## 2020-02-18 LAB — PREPARE RBC (CROSSMATCH)

## 2020-02-18 LAB — POCT I-STAT, CHEM 8
BUN: 40 mg/dL — ABNORMAL HIGH (ref 6–20)
Calcium, Ion: 1.22 mmol/L (ref 1.15–1.40)
Chloride: 101 mmol/L (ref 98–111)
Creatinine, Ser: 1.2 mg/dL (ref 0.61–1.24)
Glucose, Bld: 114 mg/dL — ABNORMAL HIGH (ref 70–99)
HCT: 26 % — ABNORMAL LOW (ref 39.0–52.0)
Hemoglobin: 8.8 g/dL — ABNORMAL LOW (ref 13.0–17.0)
Potassium: 5.1 mmol/L (ref 3.5–5.1)
Sodium: 134 mmol/L — ABNORMAL LOW (ref 135–145)
TCO2: 27 mmol/L (ref 22–32)

## 2020-02-18 LAB — ECHO INTRAOPERATIVE TEE
Height: 75 in
MV M vel: 3.87 m/s
MV Peak grad: 59.9 mmHg
Radius: 0.7 cm
Weight: 3283.97 oz

## 2020-02-18 LAB — CBC WITH DIFFERENTIAL/PLATELET
Abs Immature Granulocytes: 0.9 10*3/uL — ABNORMAL HIGH (ref 0.00–0.07)
Basophils Absolute: 0.1 10*3/uL (ref 0.0–0.1)
Basophils Relative: 1 %
Eosinophils Absolute: 0.8 10*3/uL — ABNORMAL HIGH (ref 0.0–0.5)
Eosinophils Relative: 4 %
HCT: 25.7 % — ABNORMAL LOW (ref 39.0–52.0)
Hemoglobin: 7.6 g/dL — ABNORMAL LOW (ref 13.0–17.0)
Immature Granulocytes: 4 %
Lymphocytes Relative: 6 %
Lymphs Abs: 1.3 10*3/uL (ref 0.7–4.0)
MCH: 27 pg (ref 26.0–34.0)
MCHC: 29.6 g/dL — ABNORMAL LOW (ref 30.0–36.0)
MCV: 91.5 fL (ref 80.0–100.0)
Monocytes Absolute: 1.8 10*3/uL — ABNORMAL HIGH (ref 0.1–1.0)
Monocytes Relative: 8 %
Neutro Abs: 17.1 10*3/uL — ABNORMAL HIGH (ref 1.7–7.7)
Neutrophils Relative %: 77 %
Platelets: 389 10*3/uL (ref 150–400)
RBC: 2.81 MIL/uL — ABNORMAL LOW (ref 4.22–5.81)
RDW: 23 % — ABNORMAL HIGH (ref 11.5–15.5)
WBC: 22 10*3/uL — ABNORMAL HIGH (ref 4.0–10.5)
nRBC: 0.6 % — ABNORMAL HIGH (ref 0.0–0.2)

## 2020-02-18 LAB — MAGNESIUM
Magnesium: 2.5 mg/dL — ABNORMAL HIGH (ref 1.7–2.4)
Magnesium: 2.4 mg/dL (ref 1.7–2.4)

## 2020-02-18 LAB — LACTATE DEHYDROGENASE: LDH: 447 U/L — ABNORMAL HIGH (ref 98–192)

## 2020-02-18 LAB — LACTIC ACID, PLASMA: Lactic Acid, Venous: 3.1 mmol/L (ref 0.5–1.9)

## 2020-02-18 LAB — HEMOGLOBIN AND HEMATOCRIT, BLOOD
HCT: 21.8 % — ABNORMAL LOW (ref 39.0–52.0)
Hemoglobin: 6 g/dL — CL (ref 13.0–17.0)

## 2020-02-18 LAB — HEPARIN LEVEL (UNFRACTIONATED): Heparin Unfractionated: <0.1 IU/mL — ABNORMAL LOW (ref 0.30–0.70)

## 2020-02-18 LAB — CORTISOL: Cortisol, Plasma: 21.1 ug/dL

## 2020-02-18 SURGERY — REMOVAL, CARDIAC ASSIST DEVICE, IMPELLA
Anesthesia: General

## 2020-02-18 MED ORDER — VANCOMYCIN HCL 1000 MG IV SOLR
INTRAVENOUS | Status: DC | PRN
  Administered 2020-02-18: 200 mL

## 2020-02-18 MED ORDER — VANCOMYCIN HCL IN DEXTROSE 1-5 GM/200ML-% IV SOLN
INTRAVENOUS | Status: AC
  Filled 2020-02-18: qty 200

## 2020-02-18 MED ORDER — SODIUM CHLORIDE 0.9% IV SOLUTION: Freq: Once | INTRAVENOUS | Status: AC

## 2020-02-18 MED ORDER — CALCIUM CHLORIDE 10 % IV SOLN
INTRAVENOUS | Status: AC
  Administered 2020-02-18: 1 g via INTRAVENOUS
  Filled 2020-02-18: qty 10

## 2020-02-18 MED ORDER — CALCIUM CHLORIDE 10 % IV SOLN: 1.0000 g | Freq: Once | INTRAVENOUS | Status: AC

## 2020-02-18 MED ORDER — 0.9 % SODIUM CHLORIDE (POUR BTL) OPTIME
TOPICAL | Status: DC | PRN
Start: 2020-02-18 — End: 2020-02-18
  Administered 2020-02-18: 2000 mL

## 2020-02-18 MED ORDER — EPINEPHRINE 1 MG/10ML IJ SOSY
PREFILLED_SYRINGE | INTRAMUSCULAR | Status: AC
  Filled 2020-02-18: qty 10

## 2020-02-18 MED ORDER — HYDROCORTISONE NA SUCCINATE PF 100 MG IJ SOLR
50.0000 mg | Freq: Four times a day (QID) | INTRAMUSCULAR | Status: DC
  Administered 2020-02-18 – 2020-02-22 (×14): 50 mg via INTRAVENOUS
  Filled 2020-02-18 (×15): qty 2

## 2020-02-18 MED ORDER — SODIUM BICARBONATE 8.4 % IV SOLN
INTRAVENOUS | Status: AC
  Administered 2020-02-18: 100 meq via INTRAVENOUS
  Filled 2020-02-18: qty 50

## 2020-02-18 MED ORDER — VASOPRESSIN 20 UNIT/ML IV SOLN
INTRAVENOUS | Status: DC | PRN
  Administered 2020-02-18: 2 [IU] via INTRAVENOUS
  Administered 2020-02-18: 1 [IU] via INTRAVENOUS
  Administered 2020-02-18 (×2): 2 [IU] via INTRAVENOUS
  Administered 2020-02-18: 1 [IU] via INTRAVENOUS

## 2020-02-18 MED ORDER — ROCURONIUM BROMIDE 10 MG/ML (PF) SYRINGE
PREFILLED_SYRINGE | INTRAVENOUS | Status: DC | PRN
  Administered 2020-02-18 (×2): 50 mg via INTRAVENOUS

## 2020-02-18 MED ORDER — SODIUM CHLORIDE 0.9 % IV SOLN
1.0000 g | Freq: Two times a day (BID) | INTRAVENOUS | Status: DC
  Administered 2020-02-18 – 2020-02-19 (×2): 1 g via INTRAVENOUS
  Filled 2020-02-18 (×3): qty 1

## 2020-02-18 MED ORDER — INSULIN ASPART 100 UNIT/ML IV SOLN
10.0000 [IU] | Freq: Once | INTRAVENOUS | Status: AC
  Administered 2020-02-18: 10 [IU] via INTRAVENOUS

## 2020-02-18 MED ORDER — FENTANYL CITRATE (PF) 250 MCG/5ML IJ SOLN
INTRAMUSCULAR | Status: AC
  Filled 2020-02-18: qty 5

## 2020-02-18 MED ORDER — DEXTROSE 50 % IV SOLN
INTRAVENOUS | Status: AC
  Filled 2020-02-18: qty 50

## 2020-02-18 MED ORDER — MIDAZOLAM HCL 2 MG/2ML IJ SOLN
INTRAMUSCULAR | Status: AC
  Filled 2020-02-18: qty 2

## 2020-02-18 MED ORDER — SODIUM CHLORIDE 0.9 % IV SOLN: 2.0000 g | Freq: Two times a day (BID) | INTRAVENOUS | Status: DC

## 2020-02-18 MED ORDER — EPINEPHRINE HCL 5 MG/250ML IV SOLN IN NS
INTRAVENOUS | Status: DC | PRN
  Administered 2020-02-18: 2 ug/min via INTRAVENOUS

## 2020-02-18 MED ORDER — SODIUM CHLORIDE 0.9% IV SOLUTION: Freq: Once | INTRAVENOUS | Status: DC

## 2020-02-18 MED ORDER — LEVOFLOXACIN IN D5W 500 MG/100ML IV SOLN
500.0000 mg | INTRAVENOUS | Status: AC
  Administered 2020-02-18: 500 mg via INTRAVENOUS
  Filled 2020-02-18: qty 100

## 2020-02-18 MED ORDER — MIDAZOLAM HCL 5 MG/5ML IJ SOLN
INTRAMUSCULAR | Status: DC | PRN
  Administered 2020-02-18: 2 mg via INTRAVENOUS

## 2020-02-18 MED ORDER — SODIUM BICARBONATE 8.4 % IV SOLN: 100.0000 meq | Freq: Once | INTRAVENOUS | Status: AC

## 2020-02-18 MED ORDER — VANCOMYCIN HCL 2000 MG/400ML IV SOLN
2000.0000 mg | Freq: Once | INTRAVENOUS | Status: AC
  Administered 2020-02-18: 2000 mg via INTRAVENOUS
  Filled 2020-02-18: qty 400

## 2020-02-18 MED ORDER — SODIUM CHLORIDE 0.9% IV SOLUTION
Freq: Once | INTRAVENOUS | Status: AC
Start: 2020-02-18 — End: 2020-02-18

## 2020-02-18 MED ORDER — CALCIUM CHLORIDE 10 % IV SOLN
1.0000 g | Freq: Once | INTRAVENOUS | Status: AC
  Administered 2020-02-18: 1 g via INTRAVENOUS

## 2020-02-18 MED ORDER — SODIUM ZIRCONIUM CYCLOSILICATE 10 G PO PACK
10.0000 g | PACK | Freq: Once | ORAL | Status: AC
  Administered 2020-02-18: 10 g
  Filled 2020-02-18: qty 1

## 2020-02-18 MED ORDER — VASOPRESSIN 20 UNITS/100 ML INFUSION FOR SHOCK
0.0000 [IU]/min | INTRAVENOUS | Status: DC
  Administered 2020-02-18: 0.04 [IU]/min via INTRAVENOUS
  Administered 2020-02-18: 0.03 [IU]/min via INTRAVENOUS
  Administered 2020-02-19 – 2020-02-20 (×3): 0.04 [IU]/min via INTRAVENOUS
  Administered 2020-02-20: 0.03 [IU]/min via INTRAVENOUS
  Administered 2020-02-21: 0.04 [IU]/min via INTRAVENOUS
  Administered 2020-02-21: 0.03 [IU]/min via INTRAVENOUS
  Administered 2020-02-21: 0.02 [IU]/min via INTRAVENOUS
  Filled 2020-02-18 (×6): qty 100

## 2020-02-18 MED ORDER — POTASSIUM PHOSPHATES 15 MMOLE/5ML IV SOLN
20.0000 mmol | Freq: Once | INTRAVENOUS | Status: DC
  Administered 2020-02-18: 20 mmol via INTRAVENOUS
  Filled 2020-02-18: qty 6.67

## 2020-02-18 MED ORDER — SODIUM ZIRCONIUM CYCLOSILICATE 5 G PO PACK
5.0000 g | PACK | Freq: Once | ORAL | Status: AC
  Administered 2020-02-18: 5 g
  Filled 2020-02-18: qty 1

## 2020-02-18 MED ORDER — PLASMA-LYTE 148 IV SOLN: INTRAVENOUS | Status: AC

## 2020-02-18 MED ORDER — DEXTROSE 50 % IV SOLN
INTRAVENOUS | Status: AC
  Administered 2020-02-18: 12.5 g via INTRAVENOUS
  Filled 2020-02-18: qty 50

## 2020-02-18 MED ORDER — SODIUM CHLORIDE 0.9 % IV SOLN: INTRAVENOUS | Status: DC | PRN

## 2020-02-18 MED ORDER — VANCOMYCIN HCL IN DEXTROSE 1-5 GM/200ML-% IV SOLN: 1000.0000 mg | INTRAVENOUS | Status: DC

## 2020-02-18 MED ORDER — DEXTROSE 50 % IV SOLN
INTRAVENOUS | Status: AC
  Administered 2020-02-18: 50 mL via INTRAVENOUS
  Filled 2020-02-18: qty 50

## 2020-02-18 MED ORDER — DEXTROSE 50 % IV SOLN: 1.0000 | Freq: Once | INTRAVENOUS | Status: AC

## 2020-02-18 MED ORDER — DEXTROSE 50 % IV SOLN: 12.5000 g | INTRAVENOUS | Status: AC

## 2020-02-18 SURGICAL SUPPLY — 32 items
BLADE CLIPPER SURG (BLADE) IMPLANT
CLIP VESOCCLUDE LG 6/CT (CLIP) ×2 IMPLANT
CNTNR URN SCR LID CUP LEK RST (MISCELLANEOUS) ×2 IMPLANT
CONT SPEC 4OZ STRL OR WHT (MISCELLANEOUS) ×4
COVER SURGICAL LIGHT HANDLE (MISCELLANEOUS) ×2 IMPLANT
DRAIN HEMOVAC 1/8 X 5 (WOUND CARE) IMPLANT
DRAIN JACKSON PRATT 10MM FLAT (MISCELLANEOUS) ×2 IMPLANT
DRAPE SLUSH/WARMER DISC (DRAPES) ×2 IMPLANT
DRSG AQUACEL AG ADV 3.5X 6 (GAUZE/BANDAGES/DRESSINGS) ×2 IMPLANT
EVACUATOR SILICONE 100CC (DRAIN) ×2 IMPLANT
GAUZE SPONGE 4X4 12PLY STRL LF (GAUZE/BANDAGES/DRESSINGS) ×2 IMPLANT
GLOVE ECLIPSE 6.5 STRL STRAW (GLOVE) ×4 IMPLANT
GLOVE SURG NEOP MICRO LF SZ7.5 (GLOVE) ×2 IMPLANT
GOWN STRL REUS W/ TWL LRG LVL3 (GOWN DISPOSABLE) ×2 IMPLANT
GOWN STRL REUS W/TWL LRG LVL3 (GOWN DISPOSABLE) ×4
INSERT FOGARTY SM (MISCELLANEOUS) ×2 IMPLANT
KIT BASIN OR (CUSTOM PROCEDURE TRAY) ×2 IMPLANT
NS IRRIG 1000ML POUR BTL (IV SOLUTION) ×4 IMPLANT
PACK GENERAL/GYN (CUSTOM PROCEDURE TRAY) ×2 IMPLANT
PACK UNIVERSAL I (CUSTOM PROCEDURE TRAY) ×2 IMPLANT
PAD ARMBOARD 7.5X6 YLW CONV (MISCELLANEOUS) ×4 IMPLANT
PAD ELECT DEFIB RADIOL ZOLL (MISCELLANEOUS) ×2 IMPLANT
STAPLER VISISTAT 35W (STAPLE) ×2 IMPLANT
SUT ETHILON 3 0 FSL (SUTURE) ×2 IMPLANT
SUT ETHILON 3 0 PS 1 (SUTURE) IMPLANT
SUT MNCRL AB 4-0 PS2 18 (SUTURE) ×2 IMPLANT
SUT VIC AB 1 CTX 18 (SUTURE) ×2 IMPLANT
SUT VIC AB 2-0 CT1 36 (SUTURE) ×2 IMPLANT
TAPE CLOTH SURG 4X10 WHT LF (GAUZE/BANDAGES/DRESSINGS) ×2 IMPLANT
TOWEL GREEN STERILE (TOWEL DISPOSABLE) ×2 IMPLANT
TOWEL GREEN STERILE FF (TOWEL DISPOSABLE) ×2 IMPLANT
WATER STERILE IRR 1000ML POUR (IV SOLUTION) ×4 IMPLANT

## 2020-02-18 NOTE — Progress Notes (Addendum)
[name] NEPHROLOGY PROGRESS NOTE  Assessment/ Plan: Pt is a 50 y.o. yo male  with familial cardiomyopathy EF20% s/p redo CABG 77/41/42 complicated by persistent shock, multiple attempts at sternal closure with formation of chest hematoma and tamponade, Afib with RVR, and AKI requiring CRRT.    # Anuric AKI/CKD stage III- presumably due to ischemic ATN in setting of cardiogenic shock s/p CABG.  CRRT started 01/16/20.  All fluids 4K/2.5Ca.  RIJ on 01/28/20. - continue CRRT,  goal net neg 0 to 60m/hr neg, continue same prescription today. - suggest involving palliative care, consideration of transition to comfort measures  # CAD s/p CABG complicated by cardiogenic shock and post-op hematoma. secondary closure on 12/23 then had to be re opened. Went to the OR for washout on 12/28, bedside washout 02/01/19.  OR 02/25/2020 for closure, then had emergent reopening at the bedside 02/11/2020 d/t anterior chest hematoma again.  Plan to take him to the OR today.  # Cardiogenic shock- underlying familial cardiomyopathy s/p redo CABG, EF ~20%. Remains on pressors, inotrope, Impella. Midodrine 20 tid  # ABLA- s/p postoperative bleeding into chest cavity s/p re-exploration for tamponade/hematoma. Received multiple blood products  # Atrial fibrillation - s/p DCCV on 12/16. On amiodarone.   # VDRF- per CHF/PCCMsvc. Trach/peg 1/10  #-Leukocytosis/ new fever: per primary  #Hypophosphatemia: Repleted IV phosphate.  Subjective: Seen and examined in ICU.  Currently CRRT is off for the preparation to take him to the OR.  On multiple pressors.  Remains intubated and not responding. Objective Vital signs in last 24 hours: Vitals:   02/27/2020 0630 02/22/2020 0645 02/17/2020 0700 02/23/2020 0715  BP:      Pulse: 81 80 79 77  Resp: _0 Temp:      TempSrc:      SpO2: 100% 100% 100% 100%  Weight:      Height:       Weight change:   Intake/Output Summary (Last 24 hours) at  02/04/2020 0727 Last data filed at 02/23/2020 0725 Gross per 24 hour  Intake 4175.27 ml  Output 4691 ml  Net -515.73 ml       Labs: Basic Metabolic Panel: Recent Labs  Lab 02/17/20 0414 02/17/20 0447 02/17/20 1644 02/23/2020 0305 02/20/2020 0319  NA 135   < > 135 135 135  K 4.8   < > 4.9 4.6 4.5  CL 101  --  101 102  --   CO2 24  --  25 24  --   GLUCOSE 189*  --  171* 65*  --   BUN 41*  --  44* 42*  --   CREATININE 1.29*  --  1.21 1.18  --   CALCIUM 8.0*  --  8.1* 8.3*  --   PHOS 2.8  --  2.4* 2.1*  --    < > = values in this interval not displayed.   Liver Function Tests: Recent Labs  Lab 02/16/20 0310 02/16/20 1742 02/17/20 0414 02/17/20 1644 02/01/2020 0305  AST 57*  --   --   --   --   ALT 51*  --   --   --   --   ALKPHOS 261*  --   --   --   --   BILITOT 1.2  --   --   --   --   PROT 5.8*  --   --   --   --   ALBUMIN 1.8*   < >  1.8* 1.7* 1.6*   < > = values in this interval not displayed.   No results for input(s): LIPASE, AMYLASE in the last 168 hours. No results for input(s): AMMONIA in the last 168 hours. CBC: Recent Labs  Lab 02/08/2020 0338 02/15/20 0254 02/16/20 0310 02/17/20 0414 02/17/20 0447 02/25/2020 0305 02/27/2020 0319  WBC 15.4* 18.9* 18.2* 19.6*  --  22.0*  --   NEUTROABS 11.9* 14.4* 14.5* 14.9*  --  17.1*  --   HGB 9.1* 8.3* 7.5* 7.7* 9.5* 7.6* 8.5*  HCT 29.6* 26.4* 24.3* 26.0* 28.0* 25.7* 25.0*  MCV 88.6 89.5 89.0 92.5  --  91.5  --   PLT 440* 516* 447* 425*  --  389  --    Cardiac Enzymes: No results for input(s): CKTOTAL, CKMB, CKMBINDEX, TROPONINI in the last 168 hours. CBG: Recent Labs  Lab 02/17/20 1940 02/17/20 2341 02/21/2020 0443 02/04/2020 0508 02/13/2020 0558  GLUCAP 139* 151* 68* 108* 83    Iron Studies: No results for input(s): IRON, TIBC, TRANSFERRIN, FERRITIN in the last 72 hours. Studies/Results: DG Chest 1 View  Result Date: 02/17/2020 CLINICAL DATA:  Respiratory failure.  Tracheostomy present EXAM: CHEST  1 VIEW  COMPARISON:  February 16, 2020 FINDINGS: Tracheostomy catheter tip is 6.2 cm above the carina. There is an Impella device, unchanged in position. There are bilateral chest tubes and a mediastinal drain. There is a pacemaker with lead tip attached to the right atrium. No pneumothorax. There is opacity in the right apex region with overlying skin staples. There is ill-defined airspace opacity in the left lower lobe with small left pleural effusion. There is a small right pleural effusion. There is stable cardiomegaly with pulmonary venous hypertension. No adenopathy. No bone lesions. IMPRESSION: Tube and catheter positions as described without pneumothorax. Suspect hematoma right apex. Atelectatic change left lower lobe. Small pleural effusions bilaterally. No new opacity. Stable cardiac prominence with pulmonary vascular congestion. Electronically Signed   By: Lowella Grip III M.D.   On: 02/17/2020 07:58    Medications: Infusions:   prismasol BGK 4/2.5 500 mL/hr at 02/17/20 1518    prismasol BGK 4/2.5 200 mL/hr at 02/17/20 1658   sodium chloride Stopped (01/29/20 0955)   sodium chloride     amiodarone 30 mg/hr (02/23/2020 0725)   dexmedetomidine (PRECEDEX) IV infusion 1 mcg/kg/hr (02/25/2020 0725)   dextrose 40 mL/hr at 02/08/2020 0725   DOBUTamine 5 mcg/kg/min (02/22/2020 0725)   electrolyte-A Stopped (02/03/20 6734)   epinephrine Stopped (02/08/20 0651)   feeding supplement (PIVOT 1.5 CAL) Stopped (02/23/2020 0515)   impella catheter heparin 50 unit/mL in dextrose 5%     HYDROmorphone 4 mg/hr (02/11/2020 0725)   lactated ringers 10 mL/hr at 02/08/2020 0725   norepinephrine (LEVOPHED) Adult infusion 25 mcg/min (02/27/2020 0725)   prismasol BGK 4/2.5 1,500 mL/hr at 02/09/2020 0458   vasopressin Stopped (02/15/20 0841)    Scheduled Medications:  sodium chloride   Intravenous Once   vitamin C  500 mg Per Tube TID   aspirin  324 mg Per Tube Daily   atorvastatin  80 mg Per Tube Daily    busPIRone  10 mg Per Tube TID   chlorhexidine gluconate (MEDLINE KIT)  15 mL Mouth Rinse BID   Chlorhexidine Gluconate Cloth  6 each Topical Daily   darbepoetin (ARANESP) injection - NON-DIALYSIS  100 mcg Subcutaneous Q Sat-1800   docusate  100 mg Per Tube BID   feeding supplement (PROSource TF)  45 mL Per Tube TID  fiber  1 packet Per Tube BID   FLUoxetine  40 mg Per Tube BID   HYDROmorphone  4 mg Oral Q6H   insulin aspart  1-3 Units Subcutaneous Q4H   insulin aspart  6 Units Subcutaneous Q4H   insulin detemir  18 Units Subcutaneous Q12H   LORazepam  0.25 mg Intravenous Q4H   mouth rinse  15 mL Mouth Rinse 10 times per day   melatonin  5 mg Per Tube Daily   mexiletine  150 mg Oral Q8H   midodrine  20 mg Per Tube TID WC   multivitamin  1 tablet Per Tube QHS   nutrition supplement (JUVEN)  1 packet Per Tube BID BM   pantoprazole (PROTONIX) IV  40 mg Intravenous Q12H   polyethylene glycol  17 g Per Tube Daily   sodium chloride flush  10-40 mL Intracatheter Q12H   thiamine  100 mg Per Tube Daily   valproic acid  250 mg Per Tube BID    have reviewed scheduled and prn medications.  Physical Exam: General: Critically ill looking male sedated and intubated. Heart:RRR, s1s2 nl Lungs: Coarse breath sound bilateral Abdomen:soft, Non-tender, non-distended Extremities:No edema Dialysis Access: Right IJ site clean.  Corey Palmer Prasad Shaterra [place] 02/20/2020,7:27 AM  LOS: 50 days

## 2020-02-18 NOTE — Progress Notes (Signed)
°  Echocardiogram Echocardiogram Transesophageal has been performed.  Corey Palmer 02/06/2020, 9:53 AM

## 2020-02-18 NOTE — Progress Notes (Signed)
Asked to review today's EKG with widening of his QRS. I think is sinus, with what appears P waves preceding QRS in lead II rhythm lead, with some motion artifact as well AVF also looks sinus to me There is no axis shift in comparison to 02/10/2020 ekg to suggest ventricular rhythm.  Dr. Caryl Comes reviewed EKG as well  Agreed is difficult though as noted above the same, also notes small Q wave lead I in both, feels is supraventricular, not a ventricular rhythm. Suggested repeating labs/lytes.  Reviewed with Dr. Aundra Dubin, they are getting stat labs  Tommye Standard, PA-C

## 2020-02-18 NOTE — H&P (Signed)
History and Physical Interval Note:  02/13/2020 7:20 AM  Corey Palmer  has presented today for surgery, with the diagnosis of stenal wound.  The various methods of treatment have been discussed with the patient and family. After consideration of risks, benefits and other options for treatment, the patient has consented to PROCEDURE: removal of right axillary impella as a surgical intervention.  [name] history has been reviewed, patient examined, no change in status, stable for surgery.  I have reviewed the patient's chart and labs.  Questions were answered to the patient's satisfaction.     [name]

## 2020-02-18 NOTE — Transfer of Care (Signed)
Immediate Anesthesia Transfer of Care Note  Patient: Corey Palmer  Procedure(s) Performed: REMOVAL OF IMPELLA LEFT VENTRICULAR ASSIST DEVICE (N/A ) TRANSESOPHAGEAL ECHOCARDIOGRAM (TEE) (N/A )  Patient Location: SICU  Anesthesia Type:General  Level of Consciousness: sedated, unresponsive and Patient remains intubated per anesthesia plan  Airway & Oxygen Therapy: Patient remains intubated per anesthesia plan and Patient placed on Ventilator (see vital sign flow sheet for setting)  Post-op Assessment: Report given to RN and Post -op Vital signs reviewed and stable  Post vital signs: Reviewed and stable  Last Vitals:  Vitals Value Taken Time  BP 101/70 01/31/2020 0941  Temp    Pulse 84 02/19/2020 0947  Resp 25 02/11/2020 0947  SpO2 98 % 02/17/2020 0947  Vitals shown include unvalidated device data.  Last Pain:  Vitals:   02/03/2020 0400  TempSrc: Axillary  PainSc:       Patients Stated Pain Goal: 0 (48/01/65 5374)  Complications: No complications documented.

## 2020-02-18 NOTE — Procedures (Signed)
Central Venous Catheter Insertion Procedure Note  Corey Palmer  [id number]  12-17-1970  Date:02/13/2020  Time:3:56 PM   Provider Performing:Shardee Dieu Z [name]   Procedure: Insertion of Non-tunneled Central Venous [phone]) with US guidance (56213)   Indication(s) Hemodialysis  Consent Risks of the procedure as well as the alternatives and risks of each were explained to the patient and/or caregiver.  Consent for the procedure was obtained and is signed in the bedside chart  Anesthesia Topical only with 1% lidocaine   Timeout Verified patient identification, verified procedure, site/side was marked, verified correct patient position, special equipment/implants available, medications/allergies/relevant history reviewed, required imaging and test results available.  Sterile Technique Maximal sterile technique including full sterile barrier drape, hand hygiene, sterile gown, sterile gloves, mask, hair covering, sterile ultrasound probe cover (if used).  Procedure Description Area of catheter insertion was cleaned with chlorhexidine and draped in sterile fashion.  With real-time ultrasound guidance a HD catheter was placed into the right femoral vein. Nonpulsatile blood flow and easy flushing noted in all ports.  The catheter was sutured in place and sterile dressing applied.  Complications/Tolerance None; patient tolerated the procedure well. Chest X-ray is ordered to verify placement for internal jugular or subclavian cannulation.   Chest x-ray is not ordered for femoral cannulation.  EBL Minimal  Specimen(s) None

## 2020-02-18 NOTE — Progress Notes (Signed)
This chaplain is present for F/U spiritual care post Pt. Procedure. The chaplain understands per RN-Tonya family has not visited today.

## 2020-02-18 NOTE — Brief Op Note (Signed)
02/14/2020  9:20 AM  PATIENT:  Corey Palmer  50 y.o. male  PRE-OPERATIVE DIAGNOSIS:  Congestive Heart Failure  POST-OPERATIVE DIAGNOSIS:  Congestive Heart Failure  PROCEDURE:  Procedure(s): REMOVAL OF IMPELLA LEFT VENTRICULAR ASSIST DEVICE (N/A) TRANSESOPHAGEAL ECHOCARDIOGRAM (TEE) (N/A)  SURGEON:  Surgeon(s) and Role:    * [name], MD - Primary  PHYSICIAN ASSISTANT: n/a  ASSISTANTS: staff   ANESTHESIA:   general  EBL:  100 mL   BLOOD ADMINISTERED:none  DRAINS: (10 Fr) Jackson-Pratt drain(s) with closed bulb suction in the right axillary cutdown wound   LOCAL MEDICATIONS USED:  NONE  SPECIMEN:  Source of Specimen:  right axillary wound and impella graft cuff  DISPOSITION OF SPECIMEN:  microbiology  COUNTS:  YES  TOURNIQUET:  * No tourniquets in log *  DICTATION: .Note written in EPIC  PLAN OF CARE: Admit to inpatient   PATIENT DISPOSITION:  ICU - intubated and critically ill.   Delay start of Pharmacological VTE agent (>24hrs) due to surgical blood loss or risk of bleeding: yes

## 2020-02-18 NOTE — Progress Notes (Signed)
Pharmacy Antibiotic Note  Corey Palmer is a 50 y.o. male admitted on 12/30/2019 s/p CABG with impella and open chest.   Impella removed 1/21. Patient hypotensive, increasing pressor requirements, WBC elev 22, Tm 99 on CRRT.  Pharmacy has been consulted for vancomycin and cefepime dosing.  1/21 Bcx, TA cx, wound cx   Plan: Vancomycin 2gm x1 then 1gm q24h Cefepime 2gm q12h  Height: 6\' 3"  (190.5 cm) Weight: 93.1 kg (205 lb 4 oz) IBW/kg (Calculated) : 84.5  Temp (24hrs), Avg:98.4 F (36.9 C), Min:97.8 F (36.6 C), Max:98.9 F (37.2 C)  Recent Labs  Lab 02/13/2020 0338 02/13/2020 1533 02/12/2020 1806 02/15/20 0254 02/15/20 1520 02/16/20 0310 02/16/20 1742 02/17/20 0414 02/17/20 1644 02/12/2020 0305 02/17/2020 0808  WBC 15.4*  --   --  18.9*  --  18.2*  --  19.6*  --  22.0*  --   CREATININE 1.30* 1.35*  --  1.24   < > 1.24 1.41* 1.29* 1.21 1.18 1.20  LATICACIDVEN  --  1.7 1.4  --   --   --   --   --   --   --   --    < > = values in this interval not displayed.    Estimated Creatinine Clearance: 87 mL/min (by C-G formula based on SCr of 1.2 mg/dL).    Allergies  Allergen Reactions  . Orange Fruit Anaphylaxis, Hives and Other (See Comments)    Per Pt- Blisters around lips and Hives all over, also  . Penicillins Anaphylaxis    Did it involve swelling of the face/tongue/throat, SOB, or low BP? Yes Did it involve sudden or severe rash/hives, skin peeling, or any reaction on the inside of your mouth or nose? Yes Did you need to seek medical attention at a hospital or doctor's office? No When did it last happen?childhood If all above answers are "NO", may proceed with cephalosporin use.  Corey Palmer [Empagliflozin] Itching  . Basaglar Corey Palmer [Insulin Glargine] Nausea And Vomiting    Antimicrobials this admission: Vanc 12/15 >> 12/31, restart 1/4>1/12 Meropenem 12/16 >>12/31, restart 1/4 >1/12 Nystatin 12/22 for oral thrush>12/30  Dose adjustments this  admission:   Microbiology results: 12/14 Bcx neg 12/16 Bcx neg MRSA PCR neg  12/20 TA rare yeast  12/23 sternal wound neg 1/4 Bcx: neg 1/4 TA - rare candida albicans 1/9 TA - rare yeast- reincubated   Corey Palmer Pharm.D. CPP, BCPS Clinical Pharmacist [phone] 02/28/2020 1:16 PM

## 2020-02-18 NOTE — Progress Notes (Signed)
   02/20/2020 1900  Clinical Encounter Type  Visited With Patient;Family  Visit Type Critical Care  Referral From Family  Consult/Referral To [place]  The chaplain responded to a page to support the wife of the patient. The patient is in critical condition and the wife has been called to the bedside. The wife is alone and requested prayer. The chaplain prayed at bedside for peace and clarity. The wife is faced with many decisions concerning her husband's health. The chaplain offered to listen while the wife processed the options she has. The nurses offered to allow the family to call in via WebEx. The chaplain will follow up as needed.

## 2020-02-18 NOTE — Progress Notes (Signed)
      [address]       ,[place] [address]             [phone]      PM Rounds  S/p removal of Impella  Sedated on ventilator BP (!) 90/52   Pulse 79   Temp (!) 101.4 F (38.6 C) (Oral)   Resp 18   Ht 6\' 3"  (1.905 m)   Wt 93.1 kg   SpO2 94%   BMI 25.65 kg/m  On Epi 4, Norepi 52, vasopressin 0.4 CVP 14  Febrile with septic physiology requiring escalating pressors  Central line changed  Hgb 7.5, WBC 31K K= 6.3- K phos stopped- will recheck  Critically ill with multiple organ system failure and now probable sepsis Started on vanco and meropenem  Remo Lipps C. Roxan Hockey, MD [facility] [phone]

## 2020-02-18 NOTE — Anesthesia Postprocedure Evaluation (Signed)
Anesthesia Post Note  Patient: Corey Palmer  Procedure(s) Performed: REMOVAL OF IMPELLA LEFT VENTRICULAR ASSIST DEVICE (N/A ) TRANSESOPHAGEAL ECHOCARDIOGRAM (TEE) (N/A )     Patient location during evaluation: SICU Anesthesia Type: General Level of consciousness: sedated Pain management: pain level controlled Vital Signs Assessment: post-procedure vital signs reviewed and stable Respiratory status: patient remains intubated per anesthesia plan Cardiovascular status: stable Postop Assessment: no apparent nausea or vomiting Anesthetic complications: no   No complications documented.  Last Vitals:  Vitals:   02/08/2020 1230 02/10/2020 1300  BP:    Pulse: 89 87  Resp: 16 17  Temp:    SpO2: 98% 97%    Last Pain:  Vitals:   02/25/2020 1147  TempSrc: Oral  PainSc:                  March Rummage Tejasvi Brissett

## 2020-02-18 NOTE — Progress Notes (Addendum)
Patient ID: Corey Palmer, male   DOB: 04/22/1970, 50 y.o.   MRN: [id number]     Advanced Heart Failure Rounding Note  PCP-Cardiologist: No primary care provider on file.   Subjective:    - 12/7 Torsades -ICD shock x1.  - 12/11 Impella 5.5 placed.  - 12/12 Impella repositioned in OR - 12/15 CABG x 4 with LIMA-LAD, SVG-PDA/PLV, SVG-OM - 12/16 DCCV afib.  Back to OR to re-open chest and again to evacuate hematoma.  - 12/19 CVVH initiated  - 12/23 Chest closed - 12/24 Chest reopened - 12/26 RAP back into NSR - 12/27 back in atrial fibrillation, unable to rapid atrial pace out.   - 12/28 To OR, chest partially closed and wound vac placed.  DCCV to NSR.  - 12/31 Antibiotics stopped. Swan removed, HD catheter moved from right femoral to right IJ.  - 1/5 To OR for chest closure. Chest reopened emergently at bedside again to evacuate hematoma  - 1/10 To OR: omental flap closure of chest with wound vac, tracheostomy, G-tube, J-tube.    - 1/12 Plastics and EP consulted.  - 1/13 Palliative Care consulted.  - 1/15 Went into VT, Shock 200 J and amiodarone bolus 150.   - 1/16: Milrinone stopped.    - 1/17: Partial closure of chest in OR - 1/18 Switched to dobutamine 4 mcg - 1/21 Impella Extracted   Just returned from OR, post Rt axillary Impella extraction. JP drain in place + CT  Co-ox this am, while still on Impella support, was 74%  Currently on Dobutamine 5 mcg + Norepi 30 mcg + Epi 4. Got 8u VP in OR. MAP 65  Sedated and currently paralyzed, on Vent through TC.  Still w/ CRRT requirements, CVP 12-13.    FloTrac CO 7.9  CI 3.6 SVR low at 527. WBC also rising at 22K. ? Septic shock   LDH 383=>410 =>412 => 437=> 403 => 409=> 451=>451=>431=>443=>447   Objective:   Weight Range: 93.1 kg Body mass index is 25.65 kg/m.   Vital Signs:   Temp:  [97.8 F (36.6 C)-98.9 F (37.2 C)] 98.9 F (37.2 C) (01/21 0400) Pulse Rate:  [73-90] 77 (01/21 0715) Resp:  [15-34] 16 (01/21  0715) BP: (86-100)/(62-85) 88/64 (01/21 0600) SpO2:  [94 %-100 %] 100 % (01/21 0715) Arterial Line BP: (67-113)/(38-64) 91/49 (01/21 0715) FiO2 (%):  [30 %] 30 % (01/21 0404) Weight:  [93.1 kg] 93.1 kg (01/21 0500) Last BM Date: 02/17/20  Weight change: Filed Weights   02/15/20 0437 02/16/20 0444 02/28/2020 0500  Weight: 97.5 kg 97.1 kg 93.1 kg    Intake/Output:   Intake/Output Summary (Last 24 hours) at 01/31/2020 0925 Last data filed at 02/10/2020 0844 Gross per 24 hour  Intake 3685.65 ml  Output 4292 ml  Net -606.35 ml      Physical Exam   PHYSICAL EXAM: CVP 12-13 General: critically ill, sedated, on vent through TC  HEENT: normal Neck: supple. JVD 12 cm. Carotids 2+ bilat; no bruits. No lymphadenopathy or thyromegaly appreciated. +Rt axillary JP drain Cor: PMI nondisplaced. Regular rate & rhythm. Open sternal wound Lungs: course BS bilaterally  Abdomen: soft, nontender, nondistended. No hepatosplenomegaly. No bruits or masses. Good bowel sounds. Extremities: no cyanosis, clubbing, rash, trace bilateral LE edema Neuro: sedated on vent    Telemetry   SR 80s personally reviewed,    Labs    CBC Recent Labs    02/17/20 0414 02/17/20 0447 02/17/2020 0305 02/23/2020 0319 02/16/2020 2694  WBC 19.6*  --  22.0*  --   --   NEUTROABS 14.9*  --  17.1*  --   --   HGB 7.7*   < > 7.6* 8.5* 8.8*  HCT 26.0*   < > 25.7* 25.0* 26.0*  MCV 92.5  --  91.5  --   --   PLT 425*  --  389  --   --    < > = values in this interval not displayed.   Basic Metabolic Panel Recent Labs    02/17/20 0414 02/17/20 0447 02/17/20 1644 02/27/2020 0305 01/31/2020 0319 02/08/2020 0808  NA 135   < > 135 135 135 134*  K 4.8   < > 4.9 4.6 4.5 5.1  CL 101  --  101 102  --  101  CO2 24  --  25 24  --   --   GLUCOSE 189*  --  171* 65*  --  114*  BUN 41*  --  44* 42*  --  40*  CREATININE 1.29*  --  1.21 1.18  --  1.20  CALCIUM 8.0*  --  8.1* 8.3*  --   --   MG 2.5*  --   --  2.5*  --   --   PHOS  2.8  --  2.4* 2.1*  --   --    < > = values in this interval not displayed.   Liver Function Tests Recent Labs    02/16/20 0310 02/16/20 1742 02/17/20 1644 02/13/2020 0305  AST 57*  --   --   --   ALT 51*  --   --   --   ALKPHOS 261*  --   --   --   BILITOT 1.2  --   --   --   PROT 5.8*  --   --   --   ALBUMIN 1.8*   < > 1.7* 1.6*   < > = values in this interval not displayed.   No results for input(s): LIPASE, AMYLASE in the last 72 hours. Cardiac Enzymes No results for input(s): CKTOTAL, CKMB, CKMBINDEX, TROPONINI in the last 72 hours.  BNP: BNP (last 3 results) Recent Labs    01/12/2020 1619  BNP 577.5*    ProBNP (last 3 results) No results for input(s): PROBNP in the last 8760 hours.   D-Dimer No results for input(s): DDIMER in the last 72 hours. Hemoglobin A1C No results for input(s): HGBA1C in the last 72 hours. Fasting Lipid Panel No results for input(s): CHOL, HDL, LDLCALC, TRIG, CHOLHDL, LDLDIRECT in the last 72 hours. Thyroid Function Tests No results for input(s): TSH, T4TOTAL, T3FREE, THYROIDAB in the last 72 hours.  Invalid input(s): FREET3  Other results:   Imaging    No results found.   Medications:     Scheduled Medications: . [MAR Hold] sodium chloride   Intravenous Once  . [MAR Hold] vitamin C  500 mg Per Tube TID  . [MAR Hold] aspirin  324 mg Per Tube Daily  . [MAR Hold] atorvastatin  80 mg Per Tube Daily  . [MAR Hold] busPIRone  10 mg Per Tube TID  . [MAR Hold] chlorhexidine gluconate (MEDLINE KIT)  15 mL Mouth Rinse BID  . [MAR Hold] Chlorhexidine Gluconate Cloth  6 each Topical Daily  . [MAR Hold] darbepoetin (ARANESP) injection - NON-DIALYSIS  100 mcg Subcutaneous Q Sat-1800  . [MAR Hold] docusate  100 mg Per Tube BID  . [MAR Hold] feeding supplement (PROSource  TF)  45 mL Per Tube TID  . [MAR Hold] fiber  1 packet Per Tube BID  . [MAR Hold] FLUoxetine  40 mg Per Tube BID  . [MAR Hold] HYDROmorphone  4 mg Oral Q6H  . [MAR Hold]  insulin aspart  1-3 Units Subcutaneous Q4H  . [MAR Hold] insulin aspart  6 Units Subcutaneous Q4H  . [MAR Hold] insulin detemir  18 Units Subcutaneous Q12H  . [MAR Hold] LORazepam  0.25 mg Intravenous Q4H  . [MAR Hold] mouth rinse  15 mL Mouth Rinse 10 times per day  . [MAR Hold] melatonin  5 mg Per Tube Daily  . [MAR Hold] mexiletine  150 mg Oral Q8H  . [MAR Hold] midodrine  20 mg Per Tube TID WC  . [MAR Hold] multivitamin  1 tablet Per Tube QHS  . [MAR Hold] nutrition supplement (JUVEN)  1 packet Per Tube BID BM  . [MAR Hold] pantoprazole (PROTONIX) IV  40 mg Intravenous Q12H  . [MAR Hold] polyethylene glycol  17 g Per Tube Daily  . [MAR Hold] sodium chloride flush  10-40 mL Intracatheter Q12H  . [MAR Hold] thiamine  100 mg Per Tube Daily  . [MAR Hold] valproic acid  250 mg Per Tube BID    Infusions: .  prismasol BGK 4/2.5 500 mL/hr at 02/17/20 1518  .  prismasol BGK 4/2.5 200 mL/hr at 02/17/20 1658  . [MAR Hold] sodium chloride Stopped (01/29/20 0955)  . [MAR Hold] sodium chloride    . amiodarone 30 mg/hr (02/06/2020 0729)  . [MAR Hold] dexmedetomidine (PRECEDEX) IV infusion 1 mcg/kg/hr (02/10/2020 0800)  . dextrose 40 mL/hr at 02/26/2020 0729  . DOBUTamine 5 mcg/kg/min (02/06/2020 0729)  . electrolyte-A Stopped (02/03/20 1937)  . [MAR Hold] epinephrine Stopped (02/08/20 0651)  . feeding supplement (PIVOT 1.5 CAL) Stopped (02/09/2020 0515)  . impella catheter heparin 50 unit/mL in dextrose 5%    . HYDROmorphone 4 mg/hr (02/25/2020 0729)  . lactated ringers 100 mL/hr at 02/22/2020 9024  . [MAR Hold] norepinephrine (LEVOPHED) Adult infusion 30 mcg/min (02/19/2020 0732)  . potassium PHOSPHATE IVPB (in mmol)    . prismasol BGK 4/2.5 1,500 mL/hr at 02/05/2020 0458  . vasopressin Stopped (02/15/20 0841)    PRN Medications: [MAR Hold] sodium chloride, Place/Maintain arterial line **AND** [MAR Hold] sodium chloride, 0.9 % irrigation (POUR BTL), [MAR Hold] artificial tears, [MAR Hold] bisacodyl **OR**  [MAR Hold] bisacodyl, dextrose, [MAR Hold] docusate, [MAR Hold] heparin, [MAR Hold] heparin, [MAR Hold] HYDROmorphone, [MAR Hold] lip balm, [MAR Hold] metoprolol tartrate, [MAR Hold] midazolam, [MAR Hold] polyethylene glycol, [MAR Hold] sodium chloride flush, vancomycin 1000 mg in NS (1000 ml) irrigation for Dr. Roxy Manns case     Assessment/Plan   1. Cardiogenic shock/acute on chronic systolic CHF: Initially nonischemic cardiomyopathy.  Possible familial cardiomyopathy as both parents had cardiomyopathy and died at around 49. However, [employer] gene testing did not show any common mutation for cardiomyopathy.  However, this admission noted to have severe 3 vessel disease so suspect component of ischemic cardiomyopathy.  [employer] Jude ICD.  Echo in 8/20 with EF 15% and mildly decreased RV function. Echo this admission with EF < 20%, moderate LV dilation, RV mildly reduced, severe LAE, no significant MR. Low output HF with markedly low EF.  He has a long history of cardiomyopathy (20 yrs), tends to minimize symptoms.  Cardiorenal syndrome with creatinine up to 2.2,  stabilized on milrinone and Impella 5.5. SCr 1.44 day of CABG. CABG 12/15.  Post-op shock, back  to OR 12/16 to open chest (chest wall compartment syndrome) and again to evacuate hematoma.  TEE post-op with severe RV dysfunction.  CVVHD initiated 12/19 for volume removal in setting of rising Scr/decreased UOP. Suspect ATN. S/p chest closure 12/23. Hemodynamics much worse on 12/24 with rising pressor demands. Co-ox 36%.  Bedside echo severe biventricular dysfunction with marked septal bounce. Chest re-opened 12/24.  Was atrial paced back to NSR on 12/26 with improvement in hemodynamics but back in atrial fibrillation on 12/27 and unable to pace out, DCCV to NSR on 12/28. Went back to OR 1/5 for chest closure but several hours later required emergent re-opening of chest at bedside to evacuate hematoma. To OR again 1/10 with closure of chest with omental flap with  G & J tube.  1/17 partial closure. 1/21 Impella Extracted  - Co-ox pre Impella Extraction 74%  - Currently on  Dobutamine 5 mcg + Norepi 30 mcg +Epi 4. CI by FloTrac 3.6 - repeat Co-ox this afternoon  - Continue CVVH per nephrology  2. Atrial fibrillation: H/o PAF.  He was on dofetilide in the remote past but this was stopped due to noncompliance.  He had an upper GI bleed from antral ulcers in 3/12. He was seen by GI and was deemed safe to restart anticoagulation as long as he remains on a PPI.  No apparent recurrence of AF until just prior to this admission, was cardioverted back to NSR in ER but went back into atrial fibrillation.  Post-op afib, DCCV to NSR/BiV pacing am 12/16 -> back to AF. Rapid atrial pacing back to SR  - Maintaining NSR - Continue amio 30 mg per hour.   3. CAD:   Cath this admission with severe 3 vessel disease. CABG x 4 on 12/15.  - Continue atorvastatin, ASA.  4. Acute on chronic hypoxemic respiratory failure: Vent per CCM.  Has tracheostomy.  - CCM following.  5. AKI on CKD stage 3: Likely ATN/cardiorenal. Anuric. On CVVH.    - Pulling 50 mL/hr  - management per nephrology  6. Diabetes: Insulin.  7. Hyponatremia: Resolved with CVVH.  8. Torsades/ICD shock: 12/7/212 in hospital event, in setting of severe hypokalemia and hypomagnesemia.   9. Gout: h/o gout. Complained of rt knee pain c/w previous flares - treated w/ prednisone burst (completed).  10: ID: completed abx for PNA.  Afebrile.  Cultures NGTD. WBCs rising, up to 22K today. SVR low by FloTrac 550 range. ? Developing septic shock. Repeat surveillance cultures 1/19  NGTD - start empiric abx and add VP, continue NE. Keep MAP >65  11. FEN: On TFS s via J-tube. 12. Anemia: Stable 8.8 today.  Transfuse < 7.5.  13. Stage II Pressure Ulcer- sacrum -Continue to reposition every 2 hours R/L  14. Pleural Effusion  - loculated rt apical pleural effusion noted on imaging.  - continue UF through CVVH  - PCCM  following  15. VT - RUN of NSVT required amio 150 bolus, followed by shock 200J.   - EP consulted, started mexitil.   - keep K > 4.0 and Mg > 2.0 - No VT overnight.  16. Abd pain - has G-J tube  - CT C/A/P showed no source for the abdominal pain - TFs ongoing.    Lyda Jester, PA-C  02/25/2020 9:25 AM   Patient seen with PA, agree with the above note.   Impella out today, Flotrak placed. CI has been good at 4 but SVR low.  He has required increased  pressors this afternoon, now on epinephrine 4, norepinephrine 45, vasopressin 0.03, dobutamine 5.  CVVH off currently.  MAP right around 60.   QRS has widened, reviewed with EP and still appears to be in NSR.   General: NAD Neck: JVP 12 cm, no thyromegaly or thyroid nodule.  Lungs: Decreased at bases.  CV: Nondisplaced PMI.  Heart regular S1/S2, no S3/S4, no murmur.  No peripheral edema.  N Abdomen: Soft, nontender, no hepatosplenomegaly, no distention.  Skin: Intact without lesions or rashes.  Neurologic: Alert and oriented x 3.  Psych: Normal affect. Extremities: No clubbing or cyanosis.  HEENT: Normal.   Impella is now out. He is not a candidate for LVAD or transplant at this point with RRT, not a candidate to replace Impella given no end point.  We are going to have to do the best we can to support him medically at this point.  Pressure worse this afternoon with low SVR, rising WBCs to 22 febrile to 101, cardiac index remains adequate.  Concern for septic shock component.  - Blood cultures sent, starting vancomycin/cefepime.  Replace old HD catheter, possible source.  - Continue dobutamine 4 + NE 45 + vasopressin 0.03 + epinephrine 4. - CVP 12-13, will need to resume CVVH with gentle UF when MAP stabilizes.   VT quiescent.  Suspect NSR currently with widened QRS.Continue amiodarone gtt andmexiletine 150 mg q8 hrs. Stat BMET and Mg.   Hgb 7.6 this morning, got 2 units PRBCs.  Send stat CBC.   Guarded prognosis.    CRITICAL CARE Performed by: Loralie Champagne  Total critical care time: 45 minutes  Critical care time was exclusive of separately billable procedures and treating other patients.  Critical care was necessary to treat or prevent imminent or life-threatening deterioration.  Critical care was time spent personally by me on the following activities: development of treatment plan with patient and/or surrogate as well as nursing, discussions with consultants, evaluation of patient's response to treatment, examination of patient, obtaining history from patient or surrogate, ordering and performing treatments and interventions, ordering and review of laboratory studies, ordering and review of radiographic studies, pulse oximetry and re-evaluation of patient's condition.  Loralie Champagne 02/28/2020 3:03 PM

## 2020-02-18 NOTE — Progress Notes (Signed)
      [address]       [place] [address]             [phone]      CTSP for hypotension, bradycardia  BP dropped to 50s and HR into 30s despite high dose pressors  Paced with Zoll at 70 bpm, treated hyperkalemia (K=6.6) with D50, insulin, bicarb and Lokelma. Received 2 amps of calcium.   BP and HR improved  Prognosis guarded  Remo Lipps C. Roxan Hockey, MD [facility] [phone]

## 2020-02-18 NOTE — Progress Notes (Signed)
NAME:  Corey Palmer, MRN:  [id number], DOB:  [birth year], LOS: 69 ADMISSION DATE:  01/16/2020, CONSULTATION DATE: 01/03/2020 REFERRING MD: Aundra Dubin CHIEF COMPLAINT: Respiratory failure post Impella  HPI/Course in hospital  50 year old man admitted 12/1 with A. fib/RVR, torsades, cardiogenic shock [EF 15] requiring Impella placement (removed 1/21), CABG x 4 c/b cardiac tamponade requiring chest reopening (now with VAC), AKI requiring initiation of CRRT.  Past Medical History:    has a past medical history of Anginal pain ([place]), Anxiety, Arthritis, Automatic implantable cardioverter-defibrillator in situ (2007), Bipolar disorder ([place]), CAD (coronary artery disease), Cancer ([place]) (2013), CHF (congestive heart failure) ([place]), Chondrocalcinosis of right knee (0/17/5102), Chronic systolic heart failure ([place]), Depression, Diabetes uncomplicated adult-type II, Dilated cardiomyopathy ([place]), Dyslipidemia, Dysrhythmia, GERD (gastroesophageal reflux disease), Gout, unspecified, Hepatic steatosis (2011), History of kidney stones, echocardiogram, Hypertension, Obesity (BMI 30.0-34.9), Pacemaker, Paroxysmal atrial fibrillation ([place]), Peptic ulcer, Shortness of breath, and Sleep apnea.  Significant Hospital Events:  12/2 Afib with RVR. Cardioverted in ED. AICD did not fire. Milrinone for low co ox.  12/7 ICD fired, Torsades- likely from low K of 2.8.  12/8 Cath- multivessel disease w/ low CO. Volume overloaded.  12/11 Underwent Impella 5.5 insertion for persistent symptoms of forward failure with low cardiac indices. 12/12 Back to the OR for displacement of Impella. Extubated.  12/15 CABG x4, with LIMA-LAD, SVG-PDA/PLV, SVG-OM 12/16 Emergently opened chest at bedside for tamponade, clot compressing right atrium  12/16 To OR for Exploration of chest due to bleeding  12/19 Started CRRT 12/23 Sternal Closure in OR 12/24 Worsening shock.  Chest reopened at bedside with improvement in hemodynamics.  No mediastinal  hematoma. 12/25 A flutter > rapid a paced to sinus rhythm.  Remains on high-dose pressors, inotropes 12/28 Weaning vasopressor requirements.  For chest washout today.  Back in atrial fibrillation, refractory to increased amiodarone and attempted overdrive pacing.  DCCV to NSR. Tolerating fluid removal via CRRT. 1/03 Appears euvolemic.  Adjusting glycemic control.  Chest still open so not weaning 1/04 Spiking fever, Vanco meropenem resumed after culture sent from blood and sputum. 1/05 Awaiting return to the OR for wound VAC dressing assessment fever curve and white blood cell count improving after antibiotics resumed the day prior, did have chest closed, developed tamponade physiology shortly after had to go back for emergent reopening of chest, return to intensive care and refractory shock 1/06 Wound VAC dressing replaced.  Still in shock.  Starting to wean sedation 1/07 Still on high-dose pressors, CRRT 1/15 Recurrent Vtach, shocked 1/18 Attempted pressure change to P 3 with addition of Dobutamine . MAP goal > 60 1/19 Ongoing attempts at impella wean, on PS 1/20 Stable on vent, following commands with sedation weaned, continued Impella wean attempt 1/21 Impella removed, on FloTrac, WBC 22K, pancultured, Cefepime/Vanc started  Consults:  PCCM, nephrology, cardiology, cardiothoracic surgery, EP  Procedures:  R PICC 12/2 >> R axillary Impella 5.5 12/11 >> 1/21 ETT 12/15 >> 1/10 R femoral arterial sheath 12/15 >> 12/31 R femoral HD cath 12/19 >> 12/31 Chest tube - right pleural, left pleural , mediastinal 12/23 >> 1/5 R IJ HD cath 12/31 >> R Radial A-Line 1/6 >> Chest tube - L pleural, R pleural, mediastinal 1/10 >> Trach 1/10 >> PEG 1/10 >>  Significant Diagnostic Tests:  12/8 LHC  Elevated R and L heart filling pressures with low CO, severe 3-vessel disease  Micro Data:  1/19 BCx2 >> 1/21 OR Cx >>  Antimicrobials:  Levaquin 1/21 (periop ppx) Cefepime 1/21 >>  Vanc 1/21  >>  Interval history:  Impella removed in OR this AM, JP + CT x 3 in place FloTrac for monitoring Slightly increased pressor requirements post-OR Vent post-OR PRVC PEEP 5, FiO2 40% WBC 22 (19.6), rising Cultured, antibiotics initiated CRRT paused Seen by Palliative yesterday  Objective   Blood pressure 101/70, pulse 86, temperature 98.9 F (37.2 C), temperature source Axillary, resp. rate (!) 22, height 6\' 3"  (1.905 m), weight 93.1 kg, SpO2 98 %. CVP:  [3 mmHg-39 mmHg] 11 mmHg CO:  [8.9 L/min] 8.9 L/min CI:  [4 L/min/m2] 4 L/min/m2  Vent Mode: PRVC FiO2 (%):  [30 %-50 %] 50 % Set Rate:  [24 bmp] 24 bmp Vt Set:  [510 mL] 510 mL PEEP:  [5 cmH20] 5 cmH20 Pressure Support:  [12 cmH20] 12 cmH20 Plateau Pressure:  [28 cmH20] 28 cmH20   Intake/Output Summary (Last 24 hours) at 02/11/2020 0959 Last data filed at 02/03/2020 0946 Gross per 24 hour  Intake 3835.15 ml  Output 4292 ml  Net -456.85 ml   Filed Weights   02/15/20 0437 02/16/20 0444 01/30/2020 0500  Weight: 97.5 kg 97.1 kg 93.1 kg   CVP:  [3 mmHg-39 mmHg] 11 mmHg CO:  [8.9 L/min] 8.9 L/min CI:  [4 L/min/m2] 4 L/min/m2  Examination: General: Chronically ill-appearing adult male, lying in bed in NAD. HEENT: Anicteric sclera, dry mucous membranes, trach in place. Neuro: Sedated, generalized weakness. CV: RRR, open chest with [place] in place. CT x 3 with dressings c/d/i. R axillary Impella removed. JP x 1 with thin sanguineous output at cutdown site. PULM: Breathing even and unlabored on vent via trach. Minimal secretions. Lung fields decreased at bases, mild expiratory wheeze noted. GI: Soft, minimally distended, not apparently TTP. PEG in place. Mildly hypoactive BS. Extremities: Bilateral symmetric +1 pitting edema to BLE. DP pulses present and equal. Skin: Warm, dry, no rashes.  Assessment & Plan:   Acute hypoxic and hypercapnic respiratory failure  Requiring mechanical ventilation following cardiac surgery, post  tracheostomy, improved, biggest issues are deconditioning and continued shock, overall improved, tolerated PS - Increased vent requirements post-OR - PRVC, PEEP 5, FiO2 40% - PSV wean as able - Consider hold 1/21, reassess 1/22 pending hemodynamic status  Persistent small right sided loculated pleural effusions Doubt contributing to resp failure, further management per TCTS - Monitor CXR, given increasing WBC  - Continue to manage with CRRT/volume removal as able  Cardiogenic shock S/p high risk CABG complicated by tamponade requiring open chest, now s/p omental flap closure, vac on Impella wean with delayed chest closure - Impella removed intraoperatively 1/21 - FloTrac in place at bedside - Continues to require pressor support - Levo 13mcg, Epi 15mcg, Dobutamine 34mcg, wean as able for goal MAP > 65  AFib with RVR Torsades Present PTA, c/b Torsades in the setting of hypokalemia/hypomagnesemia - Continues on amio gtt, rate-controlled at present - Antiarrhythmics per EP team - Replete electrolytes as indicated - Currently in NSR - Complex situation considering AC in the setting of Afib/recent procedures, TCTS input appreciated  Recurrrent Vtach NSVT 1/15 requiring defibrillation + amiodarone bolus - Continues on mexilitine - EP following  Leukocytosis Likely multifactorial in the setting of critical illness, multiple surgical procedures, ?infection. - WBC 22 today (19.7), slowly uptrending - CXR grossly stable - Panculture 1/21 (BCx2, resp Cx, patient anuric) - Cefepime / vanc reinitiated 1/21 - F/u Cx data  Vasoplegia Likely 2/2 prolonged critical illness - Continues on midodrine 20mg  TID  Dysphagia - S/p  GJT placement - Resume TF via JT once appropriate from postoperative standpoint  Acute kidney injury on chronic kidney disease (CKD III) C/f cardiorenal syndrome AKI with c/f ATN vs. cardiorenal syndrome with CRRT initiation 12/19 - Oliguric/anuric - Continues on  CRRT with UF as tolerated - CRRT paused intraoperatively, plan to resume - Per HF/nephro teams, goal net even/slightly negative, gentle UF  Anemia Likely mutifactorial in the setting of critical illness, multiple surgeries, renal dysfunction/CRRT - Trend CBC - Continue darbepoetin  Prolonged QT - Continue to monitor - Avoid QT-prolonging agents as able  Persistent agitation/anxiety/pain - Dilaudid gtt for pain control - Precedex for sedation, weaning PRN for assessment  DM with hyperglycemia - Continue basal insulin BID + standing insulin Q4H - SSI, sensitive scale Q4H  Profound muscular deconditioning postop due to prolonged critical illness - Reengage PT once clinically appropriate   Daily Goals Checklist  Pain/Anxiety/Delirium protocol (if indicated): Precedex/Dilaudid gtt, weaning as able VAP protocol (if indicated): Bundle in place DVT prophylaxis:  Full dose ASA Nutrition Status: TF GI prophylaxis: Pantoprazole Glucose control: Insulin (Basal, Standing, SSI) Mobility/therapy needs: Bedrest  Code Status: Full  Family Communication: Per primary Disposition: ICU   Critical care time: 32 minutes   Lestine Mount, PA-C [facility] 02/17/2020 9:59 AM   Please see [email or web address] for pager details.

## 2020-02-18 NOTE — Progress Notes (Signed)
Notified Drs. Atkins and McLean to verify need for culture of [place] tip. As this is not done per [facility] standard.  Both MDs agreed to d/c culture order.

## 2020-02-18 NOTE — Progress Notes (Signed)
4 days postop Subjective: Patient is arousable to verbal stimuli, blinking eyes. No acute distress RN at bedside  Objective: Vital signs in last 24 hours: Temp:  [97.8 F (36.6 C)-98.9 F (37.2 C)] 98.6 F (37 C) (01/21 1147) Pulse Rate:  [73-89] 89 (01/21 1105) Resp:  [15-26] 18 (01/21 1105) BP: (86-102)/(62-85) 102/65 (01/21 1105) SpO2:  [94 %-100 %] 100 % (01/21 1105) Arterial Line BP: (67-116)/(38-64) 112/44 (01/21 1000) FiO2 (%):  [30 %-50 %] 40 % (01/21 1105) Weight:  [93.1 kg] 93.1 kg (01/21 0500) Last BM Date: 02/17/20  Intake/Output from previous day: 01/20 0701 - 01/21 0700 In: 4123.3 [I.V.:2211.4; NG/GT:1120] Out: 4691 [Drains:100; Stool:700; Chest Tube:40] Intake/Output this shift: Total I/O In: 286.9 [I.V.:186.9; IV Piggyback:100] Out: 100 [Blood:100]  General appearance: Sedated, but arousable.  No distress, ill-appearing male Chest wall: Staples in place along right chest wall Incision/Wound: Midline sternal wound with wound VAC in place, 50 cc of serosanguineous drainage in canister.  No surrounding erythema.  Abra straps in place.  No dehiscence appreciated along the inferior border.  Sternal wound/incision appears stable.  Multiple tubes noted upper abdomen  Lab Results:  CBC Latest Ref Rng & Units 02/23/2020 02/22/2020 02/28/2020  WBC 4.0 - 10.5 K/uL - - -  Hemoglobin 13.0 - 17.0 g/dL 8.8(L) 8.8(L) 8.5(L)  Hematocrit 39.0 - 52.0 % 26.0(L) 26.0(L) 25.0(L)  Platelets 150 - 400 K/uL - - -    BMET Recent Labs    02/17/20 1644 02/07/2020 0305 02/05/2020 0319 02/22/2020 0805 02/27/2020 0808  NA 135 135   < > 136 134*  K 4.9 4.6   < > 4.9 5.1  CL 101 102  --   --  101  CO2 25 24  --   --   --   GLUCOSE 171* 65*  --   --  114*  BUN 44* 42*  --   --  40*  CREATININE 1.21 1.18  --   --  1.20  CALCIUM 8.1* 8.3*  --   --   --    < > = values in this interval not displayed.   PT/INR No results for input(s): LABPROT, INR in the last 72 hours. ABG Recent Labs     02/26/2020 0319 02/22/2020 0805  PHART 7.391 7.324*  HCO3 27.5 26.2    Studies/Results: DG Chest 1 View  Result Date: 02/17/2020 CLINICAL DATA:  Respiratory failure.  Tracheostomy present EXAM: CHEST  1 VIEW COMPARISON:  February 16, 2020 FINDINGS: Tracheostomy catheter tip is 6.2 cm above the carina. There is an Impella device, unchanged in position. There are bilateral chest tubes and a mediastinal drain. There is a pacemaker with lead tip attached to the right atrium. No pneumothorax. There is opacity in the right apex region with overlying skin staples. There is ill-defined airspace opacity in the left lower lobe with small left pleural effusion. There is a small right pleural effusion. There is stable cardiomegaly with pulmonary venous hypertension. No adenopathy. No bone lesions. IMPRESSION: Tube and catheter positions as described without pneumothorax. Suspect hematoma right apex. Atelectatic change left lower lobe. Small pleural effusions bilaterally. No new opacity. Stable cardiac prominence with pulmonary vascular congestion. Electronically Signed   By: Lowella Grip III M.D.   On: 02/17/2020 07:58   DG CHEST PORT 1 VIEW  Result Date: 02/07/2020 CLINICAL DATA:  Removal of Impella device EXAM: PORTABLE CHEST 1 VIEW COMPARISON:  February 17, 2020 FINDINGS: Impella device has been removed. Tracheostomy catheter tip is 6.8  cm above the carina. Central catheter tip is in the superior vena cava near the cavoatrial junction. Pacemaker lead attached to right ventricle. Drain and skin staples again noted in the right apex with right apical pleural opacity, stable. There is ill-defined opacity in the right lower lung region which in part may be due to layering pleural effusion. There is mild left base atelectasis. There is stable cardiomegaly. Pulmonary vascularity is normal. No adenopathy. No bone lesions. IMPRESSION: Tube and catheter positions as described without pneumothorax evident.  Postoperative change right apex with asymmetric opacity in this area. Suspect layering pleural effusion on the right. Mild left base atelectasis. Stable cardiomegaly. Electronically Signed   By: Lowella Grip III M.D.   On: 02/17/2020 11:12    Anti-infectives: Anti-infectives (From admission, onward)   Start     Dose/Rate Route Frequency Ordered Stop   02/12/2020 0859  vancomycin (VANCOCIN) 1,000 mg in sodium chloride 0.9 % 1,000 mL irrigation  Status:  Discontinued          As needed 02/27/2020 0859 02/19/2020 0927   02/17/2020 0800  levofloxacin (LEVAQUIN) IVPB 500 mg        500 mg 100 mL/hr over 60 Minutes Intravenous To Surgery 02/04/2020 0758 02/25/2020 0905   02/17/2020 1115  vancomycin (VANCOREADY) IVPB 1500 mg/300 mL  Status:  Discontinued        1,500 mg 150 mL/hr over 120 Minutes Intravenous NOW 02/19/2020 1103 01/30/2020 1245   01/29/2020 1400  vancomycin (VANCOCIN) IVPB 1000 mg/200 mL premix  Status:  Discontinued        1,000 mg 200 mL/hr over 60 Minutes Intravenous Every 24 hours 02/01/20 1011 02/09/20 0957   02/01/20 1400  meropenem (MERREM) 1 g in sodium chloride 0.9 % 100 mL IVPB  Status:  Discontinued        1 g 200 mL/hr over 30 Minutes Intravenous Every 8 hours 02/01/20 1011 02/09/20 0957   02/01/20 1100  vancomycin (VANCOREADY) IVPB 2000 mg/400 mL        2,000 mg 200 mL/hr over 120 Minutes Intravenous  Once 02/01/20 1011 02/01/20 1327   01/28/20 1300  vancomycin (VANCOCIN) IVPB 1000 mg/200 mL premix  Status:  Discontinued        1,000 mg 200 mL/hr over 60 Minutes Intravenous Every 24 hours 01/27/20 1430 01/28/20 0830   01/24/20 1300  vancomycin (VANCOCIN) IVPB 1000 mg/200 mL premix  Status:  Discontinued        1,000 mg 200 mL/hr over 60 Minutes Intravenous Every 24 hours 01/24/20 0846 01/27/20 1430   01/22/20 1601  vancomycin variable dose per unstable renal function (pharmacist dosing)  Status:  Discontinued         Does not apply See admin instructions 01/22/20 1601 01/24/20  0846   01/21/20 1200  vancomycin (VANCOREADY) IVPB 1250 mg/250 mL  Status:  Discontinued        1,250 mg 166.7 mL/hr over 90 Minutes Intravenous Every 24 hours 01/22/2020 1620 01/22/20 1457   01/05/2020 1800  meropenem (MERREM) 1 g in sodium chloride 0.9 % 100 mL IVPB  Status:  Discontinued        1 g 200 mL/hr over 30 Minutes Intravenous Every 8 hours 01/05/2020 1800 01/28/20 0830   01/13/2020 1348  vancomycin (VANCOCIN) 1,000 mg in sodium chloride 0.9 % 1,000 mL irrigation  Status:  Discontinued          As needed 01/18/2020 1349 01/15/2020 1611   01/06/2020 1015  vancomycin (VANCOREADY) IVPB 1500  mg/300 mL        1,500 mg 150 mL/hr over 120 Minutes Intravenous On call to O.R. 01/12/2020 0927 01/25/2020 1556   01/19/20 1700  vancomycin (VANCOREADY) IVPB 1250 mg/250 mL  Status:  Discontinued        1,250 mg 166.7 mL/hr over 90 Minutes Intravenous Every 24 hours 01/18/20 1618 01/06/2020 1620   01/16/20 1700  vancomycin (VANCOREADY) IVPB 1500 mg/300 mL  Status:  Discontinued        1,500 mg 150 mL/hr over 120 Minutes Intravenous Every 24 hours 01/16/20 1121 01/18/20 1618   01/15/20 1700  vancomycin (VANCOREADY) IVPB 750 mg/150 mL  Status:  Discontinued        750 mg 150 mL/hr over 60 Minutes Intravenous Every 12 hours 01/15/20 1107 01/16/20 1121   01/11/2020 1535  vancomycin (VANCOCIN) powder  Status:  Discontinued          As needed 01/18/2020 1536 01/28/2020 1619   01/12/2020 1400  meropenem (MERREM) 1 g in sodium chloride 0.9 % 100 mL IVPB  Status:  Discontinued        1 g 200 mL/hr over 30 Minutes Intravenous Every 8 hours 01/04/2020 1007 01/06/2020 1800   01/04/2020 1400  levofloxacin (LEVAQUIN) IVPB 500 mg  Status:  Discontinued        500 mg 100 mL/hr over 60 Minutes Intravenous To Surgery 01/06/2020 1354 01/22/2020 1619   01/19/2020 1345  vancomycin (VANCOREADY) IVPB 1500 mg/300 mL  Status:  Discontinued        1,500 mg 150 mL/hr over 120 Minutes Intravenous To Surgery 01/25/2020 1340 01/01/2020 1619   01/09/2020 1345   cefUROXime (ZINACEF) 1.5 g in sodium chloride 0.9 % 100 mL IVPB  Status:  Discontinued        1.5 g 200 mL/hr over 30 Minutes Intravenous To Surgery 01/21/2020 1340 01/20/2020 1353   01/07/2020 1345  cefUROXime (ZINACEF) 750 mg in sodium chloride 0.9 % 100 mL IVPB  Status:  Discontinued        750 mg 200 mL/hr over 30 Minutes Intravenous To Surgery 01/27/2020 1340 01/24/2020 1353   01/14/2020 1002  vancomycin (VANCOCIN) IVPB 1000 mg/200 mL premix  Status:  Discontinued        1,000 mg 200 mL/hr over 60 Minutes Intravenous Every 12 hours 01/09/2020 0948 01/15/20 1049   01/12/2020 1000  levofloxacin (LEVAQUIN) IVPB 750 mg        750 mg 100 mL/hr over 90 Minutes Intravenous Every 24 hours 01/23/2020 1512 01/10/2020 1900   01/02/2020 2130  vancomycin (VANCOCIN) IVPB 1000 mg/200 mL premix        1,000 mg 200 mL/hr over 60 Minutes Intravenous  Once 01/28/2020 1512 01/08/2020 1718   01/16/2020 1018  vancomycin (VANCOCIN) powder  Status:  Discontinued          As needed 01/08/2020 1019 01/20/2020 1511   01/03/2020 0400  vancomycin (VANCOREADY) IVPB 1500 mg/300 mL        1,500 mg 150 mL/hr over 120 Minutes Intravenous To Surgery 01/11/20 1357 01/05/2020 1100   01/21/2020 0400  levofloxacin (LEVAQUIN) IVPB 500 mg        500 mg 100 mL/hr over 60 Minutes Intravenous To Surgery 01/11/20 1357 01/22/2020 1000      Assessment/Plan: s/p Procedure(s): Sternal wound debridement with partial closure, application of ACell, application of wound VAC.  Sternal wound appears stable, wound VAC in place with good seal noted.  May return to the OR next week for further debridement,  placement of additional wound matrix and wound VAC change.  Will discuss with CT surgery.     LOS: 50 days    Charlies Constable, PA-C 01/29/2020

## 2020-02-19 ENCOUNTER — Inpatient Hospital Stay (HOSPITAL_COMMUNITY): Payer: BC Managed Care – PPO

## 2020-02-19 DIAGNOSIS — N179 Acute kidney failure, unspecified: Secondary | ICD-10-CM | POA: Diagnosis not present

## 2020-02-19 DIAGNOSIS — I4891 Unspecified atrial fibrillation: Secondary | ICD-10-CM | POA: Diagnosis not present

## 2020-02-19 DIAGNOSIS — R570 Cardiogenic shock: Secondary | ICD-10-CM | POA: Diagnosis not present

## 2020-02-19 DIAGNOSIS — I5023 Acute on chronic systolic (congestive) heart failure: Secondary | ICD-10-CM | POA: Diagnosis not present

## 2020-02-19 DIAGNOSIS — J9601 Acute respiratory failure with hypoxia: Secondary | ICD-10-CM | POA: Diagnosis not present

## 2020-02-19 LAB — HEMOGLOBIN AND HEMATOCRIT, BLOOD
HCT: 28.6 % — ABNORMAL LOW (ref 39.0–52.0)
Hemoglobin: 8.6 g/dL — ABNORMAL LOW (ref 13.0–17.0)

## 2020-02-19 LAB — CBC WITH DIFFERENTIAL/PLATELET
Abs Immature Granulocytes: 1.41 10*3/uL — ABNORMAL HIGH (ref 0.00–0.07)
Basophils Absolute: 0.1 10*3/uL (ref 0.0–0.1)
Basophils Relative: 0 %
Eosinophils Absolute: 0 10*3/uL (ref 0.0–0.5)
Eosinophils Relative: 0 %
HCT: 28.4 % — ABNORMAL LOW (ref 39.0–52.0)
Hemoglobin: 8.6 g/dL — ABNORMAL LOW (ref 13.0–17.0)
Immature Granulocytes: 6 %
Lymphocytes Relative: 2 %
Lymphs Abs: 0.6 10*3/uL — ABNORMAL LOW (ref 0.7–4.0)
MCH: 26.8 pg (ref 26.0–34.0)
MCHC: 30.3 g/dL (ref 30.0–36.0)
MCV: 88.5 fL (ref 80.0–100.0)
Monocytes Absolute: 0.9 10*3/uL (ref 0.1–1.0)
Monocytes Relative: 4 %
Neutro Abs: 21.6 10*3/uL — ABNORMAL HIGH (ref 1.7–7.7)
Neutrophils Relative %: 88 %
Platelets: 382 10*3/uL (ref 150–400)
RBC: 3.21 MIL/uL — ABNORMAL LOW (ref 4.22–5.81)
RDW: 22.4 % — ABNORMAL HIGH (ref 11.5–15.5)
WBC: 24.6 10*3/uL — ABNORMAL HIGH (ref 4.0–10.5)
nRBC: 0.2 % (ref 0.0–0.2)

## 2020-02-19 LAB — BASIC METABOLIC PANEL
Anion gap: 12 (ref 5–15)
BUN: 47 mg/dL — ABNORMAL HIGH (ref 6–20)
CO2: 18 mmol/L — ABNORMAL LOW (ref 22–32)
Calcium: 8.1 mg/dL — ABNORMAL LOW (ref 8.9–10.3)
Chloride: 100 mmol/L (ref 98–111)
Creatinine, Ser: 1.63 mg/dL — ABNORMAL HIGH (ref 0.61–1.24)
GFR, Estimated: 51 mL/min — ABNORMAL LOW (ref >60)
Glucose, Bld: 246 mg/dL — ABNORMAL HIGH (ref 70–99)
Potassium: 5.1 mmol/L (ref 3.5–5.1)
Sodium: 130 mmol/L — ABNORMAL LOW (ref 135–145)

## 2020-02-19 LAB — POCT I-STAT 7, (LYTES, BLD GAS, ICA,H+H)
Acid-base deficit: 2 mmol/L (ref 0.0–2.0)
Bicarbonate: 25 mmol/L (ref 20.0–28.0)
Calcium, Ion: 1.21 mmol/L (ref 1.15–1.40)
HCT: 29 % — ABNORMAL LOW (ref 39.0–52.0)
Hemoglobin: 9.9 g/dL — ABNORMAL LOW (ref 13.0–17.0)
O2 Saturation: 100 %
Potassium: 5.1 mmol/L (ref 3.5–5.1)
Sodium: 133 mmol/L — ABNORMAL LOW (ref 135–145)
TCO2: 27 mmol/L (ref 22–32)
pCO2 arterial: 51.5 mmHg — ABNORMAL HIGH (ref 32.0–48.0)
pH, Arterial: 7.294 — ABNORMAL LOW (ref 7.350–7.450)
pO2, Arterial: 344 mmHg — ABNORMAL HIGH (ref 83.0–108.0)

## 2020-02-19 LAB — COOXEMETRY PANEL
Carboxyhemoglobin: 1 % (ref 0.5–1.5)
Carboxyhemoglobin: 1.3 % (ref 0.5–1.5)
Methemoglobin: 0.7 % (ref 0.0–1.5)
Methemoglobin: 0.7 % (ref 0.0–1.5)
O2 Saturation: 47.5 %
O2 Saturation: 64.6 %
Total hemoglobin: 9.7 g/dL — ABNORMAL LOW (ref 12.0–16.0)
Total hemoglobin: 7.8 g/dL — ABNORMAL LOW (ref 12.0–16.0)

## 2020-02-19 LAB — RENAL FUNCTION PANEL
Albumin: 1.5 g/dL — ABNORMAL LOW (ref 3.5–5.0)
Albumin: 1.6 g/dL — ABNORMAL LOW (ref 3.5–5.0)
Anion gap: 12 (ref 5–15)
Anion gap: 10 (ref 5–15)
BUN: 44 mg/dL — ABNORMAL HIGH (ref 6–20)
BUN: 37 mg/dL — ABNORMAL HIGH (ref 6–20)
CO2: 20 mmol/L — ABNORMAL LOW (ref 22–32)
CO2: 22 mmol/L (ref 22–32)
Calcium: 8.1 mg/dL — ABNORMAL LOW (ref 8.9–10.3)
Calcium: 8.1 mg/dL — ABNORMAL LOW (ref 8.9–10.3)
Chloride: 99 mmol/L (ref 98–111)
Chloride: 98 mmol/L (ref 98–111)
Creatinine, Ser: 1.46 mg/dL — ABNORMAL HIGH (ref 0.61–1.24)
Creatinine, Ser: 1.09 mg/dL (ref 0.61–1.24)
GFR, Estimated: 58 mL/min — ABNORMAL LOW (ref >60)
GFR, Estimated: >60 mL/min (ref >60)
Glucose, Bld: 249 mg/dL — ABNORMAL HIGH (ref 70–99)
Glucose, Bld: 208 mg/dL — ABNORMAL HIGH (ref 70–99)
Phosphorus: 4.5 mg/dL (ref 2.5–4.6)
Phosphorus: 3.3 mg/dL (ref 2.5–4.6)
Potassium: 5 mmol/L (ref 3.5–5.1)
Potassium: 4.8 mmol/L (ref 3.5–5.1)
Sodium: 131 mmol/L — ABNORMAL LOW (ref 135–145)
Sodium: 130 mmol/L — ABNORMAL LOW (ref 135–145)

## 2020-02-19 LAB — HEPARIN LEVEL (UNFRACTIONATED)
Heparin Unfractionated: <0.1 IU/mL — ABNORMAL LOW (ref 0.30–0.70)
Heparin Unfractionated: <0.1 IU/mL — ABNORMAL LOW (ref 0.30–0.70)

## 2020-02-19 LAB — MAGNESIUM: Magnesium: 2.3 mg/dL (ref 1.7–2.4)

## 2020-02-19 LAB — LACTATE DEHYDROGENASE: LDH: 408 U/L — ABNORMAL HIGH (ref 98–192)

## 2020-02-19 MED ORDER — DEXTROSE 50 % IV SOLN: 0.0000 mL | INTRAVENOUS | Status: DC | PRN

## 2020-02-19 MED ORDER — HYDROMORPHONE HCL 2 MG PO TABS
4.0000 mg | ORAL_TABLET | Freq: Four times a day (QID) | ORAL | Status: DC
Start: 2020-02-19 — End: 2020-03-06
  Administered 2020-02-19 – 2020-03-06 (×61): 4 mg
  Filled 2020-02-19 (×63): qty 2

## 2020-02-19 MED ORDER — DEXTROSE 10 % IV SOLN: INTRAVENOUS | Status: DC

## 2020-02-19 MED ORDER — HEPARIN (PORCINE) 25000 UT/250ML-% IV SOLN
1800.0000 [IU]/h | INTRAVENOUS | Status: DC
  Administered 2020-02-19: 09:00:00 1000 [IU]/h via INTRAVENOUS
  Administered 2020-02-20: 900 [IU]/h via INTRAVENOUS
  Administered 2020-02-21: 1100 [IU]/h via INTRAVENOUS
  Administered 2020-02-21: 1400 [IU]/h via INTRAVENOUS
  Administered 2020-02-22: 1750 [IU]/h via INTRAVENOUS
  Administered 2020-02-22: 12:00:00 1600 [IU]/h via INTRAVENOUS
  Administered 2020-02-23 – 2020-02-24 (×2): 1800 [IU]/h via INTRAVENOUS
  Filled 2020-02-19 (×8): qty 250

## 2020-02-19 MED ORDER — SODIUM CHLORIDE 0.9 % IV SOLN
100.0000 mg | INTRAVENOUS | Status: AC
  Administered 2020-02-20 – 2020-02-25 (×6): 100 mg via INTRAVENOUS
  Filled 2020-02-19 (×6): qty 100

## 2020-02-19 MED ORDER — SODIUM CHLORIDE 0.9 % IV SOLN
1.0000 g | Freq: Three times a day (TID) | INTRAVENOUS | Status: AC
  Administered 2020-02-19 – 2020-02-25 (×20): 1 g via INTRAVENOUS
  Filled 2020-02-19 (×20): qty 1

## 2020-02-19 MED ORDER — SODIUM CHLORIDE 0.9 % IV SOLN
200.0000 mg | Freq: Once | INTRAVENOUS | Status: AC
  Administered 2020-02-19: 200 mg via INTRAVENOUS
  Filled 2020-02-19: qty 200

## 2020-02-19 MED ORDER — INSULIN REGULAR(HUMAN) IN NACL 100-0.9 UT/100ML-% IV SOLN
INTRAVENOUS | Status: DC
  Administered 2020-02-19: 9 [IU]/h via INTRAVENOUS
  Administered 2020-02-20: 1.4 [IU]/h via INTRAVENOUS
  Filled 2020-02-19 (×2): qty 100

## 2020-02-19 MED ORDER — MEXILETINE HCL 150 MG PO CAPS
150.0000 mg | ORAL_CAPSULE | Freq: Three times a day (TID) | ORAL | Status: DC
  Administered 2020-02-19 – 2020-03-07 (×52): 150 mg
  Filled 2020-02-19 (×54): qty 1

## 2020-02-19 MED ORDER — VANCOMYCIN HCL IN DEXTROSE 1-5 GM/200ML-% IV SOLN
1000.0000 mg | INTRAVENOUS | Status: AC
  Administered 2020-02-19 – 2020-02-28 (×10): 1000 mg via INTRAVENOUS
  Filled 2020-02-19 (×10): qty 200

## 2020-02-19 NOTE — Progress Notes (Addendum)
Ativan 1.75 mg wasted in stericycle.  Witnessed by Payton Doughty.   [employer] would not allow me to waste stating there was an "error."  Maudie Mercury PharmD notified.

## 2020-02-19 NOTE — Progress Notes (Signed)
[facility] NEPHROLOGY PROGRESS NOTE  Assessment/ Plan: Pt is a 50 y.o. yo male  with familial cardiomyopathy EF20% s/p redo CABG 71/24/58 complicated by persistent shock, multiple attempts at sternal closure with formation of chest hematoma and tamponade, Afib with RVR, and AKI requiring CRRT.    # Anuric AKI/CKD stage III- presumably due to ischemic ATN in setting of cardiogenic shock s/p CABG.  CRRT started 01/16/20.  All fluids 4K/2.5Ca.  RIJ on 01/28/20. Developed hyperkalemia yesterday while CRRT held during surgery.  Now potassium level improving.  Continue current prescription.  The line was changed for fever and leukocytosis. - suggest involving palliative care, consideration of transition to comfort measures  # CAD s/p CABG complicated by cardiogenic shock and post-op hematoma. secondary closure on 12/23 then had to be re opened. Went to the OR for washout on 12/28, bedside washout 02/01/19.  OR 01/30/2020 for closure, then had emergent reopening at the bedside 02/27/2020 d/t anterior chest hematoma again.   # Cardiogenic shock- underlying familial cardiomyopathy s/p redo CABG, EF ~20%. Remains on pressors, inotrope. Midodrine 20 tid.  The Impella was removed on 1/21.  # ABLA- s/p postoperative bleeding into chest cavity s/p re-exploration for tamponade/hematoma. Received multiple blood products  # Atrial fibrillation - s/p DCCV on 12/16. On amiodarone.   # VDRF- per CHF/PCCMsvc. Trach/peg 1/10  #-Leukocytosis/ new fever: per primary, started on broad-spectrum antibiotics.  #Hypophosphatemia: Repleted IV phosphate.  Subjective: Seen and examined in ICU.  Running CRRT.  On vent.  The Impella was removed yesterday.  Blood pressure dropped and was febrile.  He started broad-spectrum antibiotics.  Discussed with ICU nurse. Objective Vital signs in last 24 hours: Vitals:   02/19/20 0645 02/19/20 0700 02/19/20 0715 02/19/20 0817  BP:  98/67  (!) 116/54  Pulse: 71  74 72 72  Resp: '16 19 15 ' (!) 31  Temp:      TempSrc:      SpO2: 100% 97% 100% 100%  Weight:      Height:       Weight change: 3.2 kg  Intake/Output Summary (Last 24 hours) at 02/19/2020 0830 Last data filed at 02/19/2020 0700 Gross per 24 hour  Intake 6871.71 ml  Output 2481 ml  Net 4390.71 ml       Labs: Basic Metabolic Panel: Recent Labs  Lab 02/25/2020 0305 02/17/2020 0319 02/01/2020 1510 02/13/2020 1704 02/26/2020 1826 02/23/2020 2346 02/19/20 0426 02/19/20 0433  NA 135   < > 130* 129*   < > 130* 131* 133*  K 4.6   < > 6.3* 6.6*   < > 5.1 5.0 5.1  CL 102   < > 99 98  --  100 99  --   CO2 24  --  21* 18*  --  18* 20*  --   GLUCOSE 65*   < > 211* 201*  --  246* 249*  --   BUN 42*   < > 49* 51*  --  47* 44*  --   CREATININE 1.18   < > 1.68* 1.76*  --  1.63* 1.46*  --   CALCIUM 8.3*  --  8.3* 8.0*  --  8.1* 8.1*  --   PHOS 2.1*  --  5.0*  --   --   --  4.5  --    < > = values in this interval not displayed.   Liver Function Tests: Recent Labs  Lab 02/16/20 0310 02/16/20 1742 02/11/2020 0305 02/09/2020 1510 02/19/20 0998  AST 57*  --   --   --   --   ALT 51*  --   --   --   --   ALKPHOS 261*  --   --   --   --   BILITOT 1.2  --   --   --   --   PROT 5.8*  --   --   --   --   ALBUMIN 1.8*   < > 1.6* 1.5* 1.5*   < > = values in this interval not displayed.   No results for input(s): LIPASE, AMYLASE in the last 168 hours. No results for input(s): AMMONIA in the last 168 hours. CBC: Recent Labs  Lab 02/16/20 0310 02/17/20 0414 02/17/20 0447 02/13/2020 0305 02/06/2020 0319 02/08/2020 1510 02/17/2020 1826 02/08/2020 2346 02/19/20 0426 02/19/20 0433  WBC 18.2* 19.6*  --  22.0*  --  31.7*  --   --  24.6*  --   NEUTROABS 14.5* 14.9*  --  17.1*  --   --   --   --  21.6*  --   HGB 7.5* 7.7*   < > 7.6*   < > 7.5*   < > 8.6* 8.6* 9.9*  HCT 24.3* 26.0*   < > 25.7*   < > 25.6*   < > 28.6* 28.4* 29.0*  MCV 89.0 92.5  --  91.5  --  92.8  --   --  88.5  --   PLT 447* 425*  --  389   --  502*  --   --  382  --    < > = values in this interval not displayed.   Cardiac Enzymes: No results for input(s): CKTOTAL, CKMB, CKMBINDEX, TROPONINI in the last 168 hours. CBG: Recent Labs  Lab 02/22/2020 0558 02/09/2020 1151 02/26/2020 1713 02/28/2020 2048 02/17/2020 2341  GLUCAP 83 113* 181* 185* 212*    Iron Studies: No results for input(s): IRON, TIBC, TRANSFERRIN, FERRITIN in the last 72 hours. Studies/Results: DG CHEST PORT 1 VIEW  Result Date: 02/19/2020 CLINICAL DATA:  Coronary artery bypass grafting, respiratory failure EXAM: PORTABLE CHEST 1 VIEW COMPARISON:  02/16/2020 FINDINGS: Tracheostomy overlies its expected position. Right internal jugular hemodialysis catheter has been removed. Pulmonary insufflation is stable. Bilateral chest tubes are in place. No pneumothorax or pleural effusion on the left. On the right, a loculated apicolateral pleural effusion appears stable. Surgical drain and surgical skin staples are again seen within the right chest wall overlying this region. No pneumothorax. Mild cardiomegaly is unchanged. Left subclavian pacemaker defibrillator is unchanged. Mediastinal drain is unchanged. IMPRESSION: Interval removal of right internal jugular hemodialysis catheter. Otherwise stable examination with loculated right apicolateral pleural effusion. Bilateral chest tubes in place without evidence of pneumothorax. Electronically Signed   By: Fidela Salisbury MD   On: 02/19/2020 06:42   DG CHEST PORT 1 VIEW  Result Date: 02/23/2020 CLINICAL DATA:  Removal of Impella device EXAM: PORTABLE CHEST 1 VIEW COMPARISON:  February 17, 2020 FINDINGS: Impella device has been removed. Tracheostomy catheter tip is 6.8 cm above the carina. Central catheter tip is in the superior vena cava near the cavoatrial junction. Pacemaker lead attached to right ventricle. Drain and skin staples again noted in the right apex with right apical pleural opacity, stable. There is ill-defined opacity in  the right lower lung region which in part may be due to layering pleural effusion. There is mild left base atelectasis. There is stable cardiomegaly. Pulmonary vascularity is normal.  No adenopathy. No bone lesions. IMPRESSION: Tube and catheter positions as described without pneumothorax evident. Postoperative change right apex with asymmetric opacity in this area. Suspect layering pleural effusion on the right. Mild left base atelectasis. Stable cardiomegaly. Electronically Signed   By: Lowella Grip III M.D.   On: 02/05/2020 11:12   ECHO INTRAOPERATIVE TEE  Result Date: 02/01/2020  *INTRAOPERATIVE TRANSESOPHAGEAL REPORT *  Patient Name:   JAXZEN VANHORN Date of Exam: 02/10/2020 Medical Rec #:  [id number]     Height:       75.0 in Accession #:    [id number]    Weight:       205.2 lb Date of Birth:  November 26, 1970     BSA:          2.22 m Patient Age:    11 years      BP:           88/64 mmHg Patient Gender: M             HR:           71 bpm. Exam Location:  Inpatient Transesophogeal exam was perform intraoperatively during surgical procedure. Patient was closely monitored under general anesthesia during the entirety of examination. Indications:     I50.9* Heart failure (unspecified). Impella removal. Performing Phys: [id number] Z ATKINS Diagnosing Phys: Belenda Cruise Stoltzfus Complications: No known complications during this procedure. POST-OP IMPRESSIONS - Left Ventricle: The left ventricle is unchanged from pre-bypass. - Left Atrial Appendage: The left atrial appendage appears unchanged from pre-bypass. - Aortic Valve: The aortic valve appears unchanged from pre-bypass. - Mitral Valve: The mitral valve appears unchanged from pre-bypass. - Tricuspid Valve: The tricuspid valve appears unchanged from pre-bypass. - Interatrial Septum: The interatrial septum appears unchanged from pre-bypass. - Pericardium: The pericardium appears unchanged from pre-bypass. Impella removed without incident. No new or worsening wall  motion or valvular abnormalities. PRE-OP FINDINGS  Left Ventricle: The left ventricle has severely reduced systolic function, with an ejection fraction of 20-25%. The cavity size was severely dilated. There is no increase in left ventricular wall thickness. Left ventricular diffuse hypokinesis. Impella 5.5 in situ with ~4.4cm distance into LV cavity from aortic annulus. Right Ventricle: The right ventricle has severely reduced systolic function. The cavity was normal. There is no increase in right ventricular wall thickness. Left Atrium: Left atrial size was dilated. The left atrial appendage is well visualized and there is no evidence of thrombus present. Left atrial appendage velocity is normal at greater than 40 cm/s. Right Atrium: Right atrial size was normal in size. Interatrial Septum: No atrial level shunt detected by color flow Doppler. Pericardium: There is no evidence of pericardial effusion. There is no pleural effusion. Mitral Valve: The mitral valve is dilated. No thickening of the mitral valve leaflet. No calcification of the mitral valve leaflet. Mitral valve regurgitation is moderate to severe by color flow Doppler. The MR jet is centrally-directed. There is no evidence of mitral valve vegetation. Pulmonary venous flow shows systolic flow reversal. There is moderate mitral valve regurgitation by PISA measuring 3.08. There is No evidence of mitral stenosis. Tricuspid Valve: The tricuspid valve was normal in structure. Tricuspid valve regurgitation was not visualized by color flow Doppler. There is no evidence of tricuspid valve vegetation. Aortic Valve: The aortic valve is normal in structure. Aortic valve regurgitation was not visualized by color flow Doppler. There is no evidence of aortic valve stenosis. There is no evidence of a vegetation on the aortic  valve. Pulmonic Valve: The pulmonic valve was normal in structure, with normal. No evidence of pumonic stenosis. Pulmonic valve regurgitation is  not visualized by color flow Doppler. Aorta: The aortic root and ascending aorta are normal in size and structure. Pulmonary Artery: The pulmonary artery is of normal size. Shunts: No residual ventricular septal defect is detected by 2D or color Doppler. +--------------+--------++ LEFT VENTRICLE         +--------------+--------++ PLAX 2D                +------------+---------++ +--------------+--------++ 3D Volume EF          LVOT diam:    2.10 cm  +------------+---------++ +--------------+--------++ LV 3D EDV:  340.71 ml LVOT Area:    3.46 cm +------------+---------++ +--------------+--------++ LV 3D ESV:  273.07 ml                        +------------+---------++ +--------------+--------++  +------------+-------++ AORTA               +------------+-------++ Ao Asc diam:2.50 cm +------------+-------++ +----------------+-----------++ MR Peak grad:   59.9 mmHg   +--------------+-------+ +----------------+-----------++ SHUNTS                MR Mean grad:   29.5 mmHg   +--------------+-------+ +----------------+-----------++ Systemic Diam:2.10 cm MR Vmax:        387.00 cm/s +--------------+-------+ +----------------+-----------++ MR Vmean:       241.0 cm/s  +----------------+-----------++ MR PISA:        3.08 cm    +----------------+-----------++ MR PISA Eff ROA:40 mm      +----------------+-----------++ MR PISA Radius: 0.70 cm     +----------------+-----------++  Rochele Pages Electronically signed by Rochele Pages Signature Date/Time: 02/27/2020/2:31:11 PM    Final     Medications: Infusions: .  prismasol BGK 4/2.5 500 mL/hr at 02/17/20 1518  .  prismasol BGK 4/2.5 200 mL/hr at 02/17/20 1658  . sodium chloride 1,000 mL (02/23/2020 1829)  . sodium chloride    . amiodarone 30 mg/hr (02/19/20 0700)  . dexmedetomidine (PRECEDEX) IV infusion 1 mcg/kg/hr (02/19/20 0700)  . dextrose 40 mL/hr at 02/19/20 0700  . DOBUTamine 5  mcg/kg/min (02/19/20 0700)  . electrolyte-A Stopped (02/03/20 7782)  . epinephrine 15 mcg/min (02/23/2020 2021)  . feeding supplement (PIVOT 1.5 CAL) Stopped (02/08/2020 0515)  . heparin    . HYDROmorphone 2 mg/hr (02/19/20 0700)  . insulin 9 mL/hr at 02/19/20 0700  . lactated ringers 10 mL/hr at 02/19/20 0700  . meropenem (MERREM) IV Stopped (02/19/20 0604)  . norepinephrine (LEVOPHED) Adult infusion 30 mcg/min (02/19/20 0700)  . prismasol BGK 4/2.5 1,500 mL/hr at 02/19/20 0224  . vasopressin 0.04 Units/min (02/19/20 0700)    Scheduled Medications: . sodium chloride   Intravenous Once  . vitamin C  500 mg Per Tube TID  . aspirin  324 mg Per Tube Daily  . atorvastatin  80 mg Per Tube Daily  . busPIRone  10 mg Per Tube TID  . chlorhexidine gluconate (MEDLINE KIT)  15 mL Mouth Rinse BID  . Chlorhexidine Gluconate Cloth  6 each Topical Daily  . darbepoetin (ARANESP) injection - NON-DIALYSIS  100 mcg Subcutaneous Q Sat-1800  . docusate  100 mg Per Tube BID  . feeding supplement (PROSource TF)  45 mL Per Tube TID  . fiber  1 packet Per Tube BID  . FLUoxetine  40 mg Per Tube BID  . hydrocortisone sod succinate (SOLU-CORTEF) inj  50 mg Intravenous Q6H  .  HYDROmorphone  4 mg Oral Q6H  . insulin aspart  1-3 Units Subcutaneous Q4H  . insulin aspart  6 Units Subcutaneous Q4H  . insulin detemir  18 Units Subcutaneous Q12H  . LORazepam  0.25 mg Intravenous Q4H  . mouth rinse  15 mL Mouth Rinse 10 times per day  . melatonin  5 mg Per Tube Daily  . mexiletine  150 mg Oral Q8H  . midodrine  20 mg Per Tube TID WC  . multivitamin  1 tablet Per Tube QHS  . nutrition supplement (JUVEN)  1 packet Per Tube BID BM  . pantoprazole (PROTONIX) IV  40 mg Intravenous Q12H  . polyethylene glycol  17 g Per Tube Daily  . sodium chloride flush  10-40 mL Intracatheter Q12H  . thiamine  100 mg Per Tube Daily  . valproic acid  250 mg Per Tube BID    have reviewed scheduled and prn medications.  Physical  Exam: General: Critically ill looking male sedated and intubated. Heart:RRR, s1s2 nl Lungs: Coarse breath sound bilateral Abdomen:soft, Non-tender, non-distended Extremities:No edema Dialysis Access: Temporary catheter site clean.  Corey Palmer Prasad Danyl Deems 02/19/2020,8:30 AM  LOS: 51 days

## 2020-02-19 NOTE — Progress Notes (Signed)
Patient ID: Corey Palmer, male   DOB: 1970/05/09, 50 y.o.   MRN: [id number]     Advanced Heart Failure Rounding Note  PCP-Cardiologist: No primary care provider on file.   Subjective:    - 12/7 Torsades -ICD shock x1.  - 12/11 Impella 5.5 placed.  - 12/12 Impella repositioned in OR - 12/15 CABG x 4 with LIMA-LAD, SVG-PDA/PLV, SVG-OM - 12/16 DCCV afib.  Back to OR to re-open chest and again to evacuate hematoma.  - 12/19 CVVH initiated  - 12/23 Chest closed - 12/24 Chest reopened - 12/26 RAP back into NSR - 12/27 back in atrial fibrillation, unable to rapid atrial pace out.   - 12/28 To OR, chest partially closed and wound vac placed.  DCCV to NSR.  - 12/31 Antibiotics stopped. Swan removed, HD catheter moved from right femoral to right IJ.  - 1/5 To OR for chest closure. Chest reopened emergently at bedside again to evacuate hematoma  - 1/10 To OR: omental flap closure of chest with wound vac, tracheostomy, G-tube, J-tube.    - 1/12 Plastics and EP consulted.  - 1/13 Palliative Care consulted.  - 1/15 Went into VT, Shock 200 J and amiodarone bolus 150.   - 1/16: Milrinone stopped.    - 1/17: Partial closure of chest in OR - 1/18 Switched to dobutamine 4 mcg - 1/21 Impella Extracted.  Developed hyperkalemia when CVVH held with wide QRS rhythm and required transient external pacing.   Better this morning with correction of hyperkalemia.  Co-ox 65% on epinephrine 5, NE 30, dobutamine 5, vasopressin 0.04.  CVP 12, have starting pulling again via CVVH.  Weight up 7 lbs.   Tm 100 last night, WBCs 25 today.  CVVH catheter removed and replaced right femoral yesterday.  Now on vancomycin/meropenem, cultures NGTD.   2 units PRBCs yesterday.   FloTrac CI 3.3 SVR higher 724   Objective:   Weight Range: 96.3 kg Body mass index is 26.54 kg/m.   Vital Signs:   Temp:  [97.6 F (36.4 C)-101.4 F (38.6 C)] 97.6 F (36.4 C) (01/22 0400) Pulse Rate:  [35-98] 72 (01/22 0715) Resp:   [12-37] 15 (01/22 0715) BP: (60-121)/(41-78) 98/67 (01/22 0700) SpO2:  [90 %-100 %] 100 % (01/22 0715) Arterial Line BP: (74-152)/(38-81) 114/52 (01/22 0715) FiO2 (%):  [40 %-100 %] 100 % (01/22 0400) Weight:  [96.3 kg] 96.3 kg (01/22 0500) Last BM Date: 01/31/2020  Weight change: Filed Weights   02/16/20 0444 02/27/2020 0500 02/19/20 0500  Weight: 97.1 kg 93.1 kg 96.3 kg    Intake/Output:   Intake/Output Summary (Last 24 hours) at 02/19/2020 0757 Last data filed at 02/19/2020 0700 Gross per 24 hour  Intake 6971.71 ml  Output 2199 ml  Net 4772.71 ml      Physical Exam   General: NAD Neck: JVP 10 cm, no thyromegaly or thyroid nodule.  Lungs: Decreased at bases.  CV: Nondisplaced PMI.  Heart regular S1/S2, no S3/S4, no murmur.  No peripheral edema.  Abdomen: Soft, nontender, no hepatosplenomegaly, no distention.  Skin: Intact without lesions or rashes.  Neurologic: Will awaken and follow commands.  Extremities: No clubbing or cyanosis.  HEENT: Normal.    Telemetry   SR 80s personally reviewed,    Labs    CBC Recent Labs    02/15/2020 0305 02/03/2020 0319 02/01/2020 1510 02/01/2020 1826 02/19/20 0426 02/19/20 0433  WBC 22.0*  --  31.7*  --  24.6*  --   NEUTROABS 17.1*  --   --   --  21.6*  --   HGB 7.6*   < > 7.5*   < > 8.6* 9.9*  HCT 25.7*   < > 25.6*   < > 28.4* 29.0*  MCV 91.5  --  92.8  --  88.5  --   PLT 389  --  502*  --  382  --    < > = values in this interval not displayed.   Basic Metabolic Panel Recent Labs    02/10/2020 1510 02/17/2020 1704 02/04/2020 2346 02/19/20 0426 02/19/20 0433  NA 130*   < > 130* 131* 133*  K 6.3*   < > 5.1 5.0 5.1  CL 99   < > 100 99  --   CO2 21*   < > 18* 20*  --   GLUCOSE 211*   < > 246* 249*  --   BUN 49*   < > 47* 44*  --   CREATININE 1.68*   < > 1.63* 1.46*  --   CALCIUM 8.3*   < > 8.1* 8.1*  --   MG 2.4  --   --  2.3  --   PHOS 5.0*  --   --  4.5  --    < > = values in this interval not displayed.   Liver Function  Tests Recent Labs    02/08/2020 1510 02/19/20 0426  ALBUMIN 1.5* 1.5*   No results for input(s): LIPASE, AMYLASE in the last 72 hours. Cardiac Enzymes No results for input(s): CKTOTAL, CKMB, CKMBINDEX, TROPONINI in the last 72 hours.  BNP: BNP (last 3 results) Recent Labs    12/30/2019 1619  BNP 577.5*    ProBNP (last 3 results) No results for input(s): PROBNP in the last 8760 hours.   D-Dimer No results for input(s): DDIMER in the last 72 hours. Hemoglobin A1C No results for input(s): HGBA1C in the last 72 hours. Fasting Lipid Panel No results for input(s): CHOL, HDL, LDLCALC, TRIG, CHOLHDL, LDLDIRECT in the last 72 hours. Thyroid Function Tests No results for input(s): TSH, T4TOTAL, T3FREE, THYROIDAB in the last 72 hours.  Invalid input(s): FREET3  Other results:   Imaging    DG CHEST PORT 1 VIEW  Result Date: 02/19/2020 CLINICAL DATA:  Coronary artery bypass grafting, respiratory failure EXAM: PORTABLE CHEST 1 VIEW COMPARISON:  02/26/2020 FINDINGS: Tracheostomy overlies its expected position. Right internal jugular hemodialysis catheter has been removed. Pulmonary insufflation is stable. Bilateral chest tubes are in place. No pneumothorax or pleural effusion on the left. On the right, a loculated apicolateral pleural effusion appears stable. Surgical drain and surgical skin staples are again seen within the right chest wall overlying this region. No pneumothorax. Mild cardiomegaly is unchanged. Left subclavian pacemaker defibrillator is unchanged. Mediastinal drain is unchanged. IMPRESSION: Interval removal of right internal jugular hemodialysis catheter. Otherwise stable examination with loculated right apicolateral pleural effusion. Bilateral chest tubes in place without evidence of pneumothorax. Electronically Signed   By: Fidela Salisbury MD   On: 02/19/2020 06:42   DG CHEST PORT 1 VIEW  Result Date: 02/17/2020 CLINICAL DATA:  Removal of Impella device EXAM: PORTABLE  CHEST 1 VIEW COMPARISON:  February 17, 2020 FINDINGS: Impella device has been removed. Tracheostomy catheter tip is 6.8 cm above the carina. Central catheter tip is in the superior vena cava near the cavoatrial junction. Pacemaker lead attached to right ventricle. Drain and skin staples again noted in the right apex with right apical pleural opacity, stable. There is ill-defined opacity in the right  lower lung region which in part may be due to layering pleural effusion. There is mild left base atelectasis. There is stable cardiomegaly. Pulmonary vascularity is normal. No adenopathy. No bone lesions. IMPRESSION: Tube and catheter positions as described without pneumothorax evident. Postoperative change right apex with asymmetric opacity in this area. Suspect layering pleural effusion on the right. Mild left base atelectasis. Stable cardiomegaly. Electronically Signed   By: Lowella Grip III M.D.   On: 02/19/2020 11:12     Medications:     Scheduled Medications: . sodium chloride   Intravenous Once  . vitamin C  500 mg Per Tube TID  . aspirin  324 mg Per Tube Daily  . atorvastatin  80 mg Per Tube Daily  . busPIRone  10 mg Per Tube TID  . chlorhexidine gluconate (MEDLINE KIT)  15 mL Mouth Rinse BID  . Chlorhexidine Gluconate Cloth  6 each Topical Daily  . darbepoetin (ARANESP) injection - NON-DIALYSIS  100 mcg Subcutaneous Q Sat-1800  . docusate  100 mg Per Tube BID  . feeding supplement (PROSource TF)  45 mL Per Tube TID  . fiber  1 packet Per Tube BID  . FLUoxetine  40 mg Per Tube BID  . hydrocortisone sod succinate (SOLU-CORTEF) inj  50 mg Intravenous Q6H  . HYDROmorphone  4 mg Oral Q6H  . insulin aspart  1-3 Units Subcutaneous Q4H  . insulin aspart  6 Units Subcutaneous Q4H  . insulin detemir  18 Units Subcutaneous Q12H  . LORazepam  0.25 mg Intravenous Q4H  . mouth rinse  15 mL Mouth Rinse 10 times per day  . melatonin  5 mg Per Tube Daily  . mexiletine  150 mg Oral Q8H  .  midodrine  20 mg Per Tube TID WC  . multivitamin  1 tablet Per Tube QHS  . nutrition supplement (JUVEN)  1 packet Per Tube BID BM  . pantoprazole (PROTONIX) IV  40 mg Intravenous Q12H  . polyethylene glycol  17 g Per Tube Daily  . sodium chloride flush  10-40 mL Intracatheter Q12H  . thiamine  100 mg Per Tube Daily  . valproic acid  250 mg Per Tube BID    Infusions: .  prismasol BGK 4/2.5 500 mL/hr at 02/17/20 1518  .  prismasol BGK 4/2.5 200 mL/hr at 02/17/20 1658  . sodium chloride 1,000 mL (02/05/2020 1829)  . sodium chloride    . amiodarone 30 mg/hr (02/19/20 0700)  . dexmedetomidine (PRECEDEX) IV infusion 1 mcg/kg/hr (02/19/20 0700)  . dextrose 40 mL/hr at 02/19/20 0700  . DOBUTamine 5 mcg/kg/min (02/19/20 0700)  . electrolyte-A Stopped (02/03/20 4008)  . epinephrine 15 mcg/min (02/03/2020 2021)  . feeding supplement (PIVOT 1.5 CAL) Stopped (02/05/2020 0515)  . impella catheter heparin 50 unit/mL in dextrose 5%    . HYDROmorphone 2 mg/hr (02/19/20 0700)  . insulin 9 mL/hr at 02/19/20 0700  . lactated ringers 10 mL/hr at 02/19/20 0700  . meropenem (MERREM) IV Stopped (02/19/20 0604)  . norepinephrine (LEVOPHED) Adult infusion 30 mcg/min (02/19/20 0700)  . prismasol BGK 4/2.5 1,500 mL/hr at 02/19/20 0224  . vasopressin 0.04 Units/min (02/19/20 0700)    PRN Medications: sodium chloride, Place/Maintain arterial line **AND** sodium chloride, artificial tears, bisacodyl **OR** bisacodyl, dextrose, dextrose, docusate, heparin, heparin, HYDROmorphone, lip balm, metoprolol tartrate, midazolam, polyethylene glycol, sodium chloride flush     Assessment/Plan   1. Cardiogenic shock/acute on chronic systolic CHF: Initially nonischemic cardiomyopathy.  Possible familial cardiomyopathy as both parents had cardiomyopathy  and died at around 34. However, [employer] gene testing did not show any common mutation for cardiomyopathy.  However, this admission noted to have severe 3 vessel disease so  suspect component of ischemic cardiomyopathy.  [employer] Jude ICD.  Echo in 8/20 with EF 15% and mildly decreased RV function. Echo this admission with EF < 20%, moderate LV dilation, RV mildly reduced, severe LAE, no significant MR. Low output HF with markedly low EF.  He has a long history of cardiomyopathy (20 yrs), tends to minimize symptoms.  Cardiorenal syndrome with creatinine up to 2.2,  stabilized on milrinone and Impella 5.5. SCr 1.44 day of CABG. CABG 12/15.  Post-op shock, back to OR 12/16 to open chest (chest wall compartment syndrome) and again to evacuate hematoma.  TEE post-op with severe RV dysfunction.  CVVHD initiated 12/19 for volume removal in setting of rising Scr/decreased UOP. Suspect ATN. S/p chest closure 12/23. Hemodynamics much worse on 12/24 with rising pressor demands. Co-ox 36%.  Bedside echo severe biventricular dysfunction with marked septal bounce. Chest re-opened 12/24.  Was atrial paced back to NSR on 12/26 with improvement in hemodynamics but back in atrial fibrillation on 12/27 and unable to pace out, DCCV to NSR on 12/28. Went back to OR 1/5 for chest closure but several hours later required emergent re-opening of chest at bedside to evacuate hematoma. To OR again 1/10 with closure of chest with omental flap with G & J tube.  1/17 partial closure. 1/21 Impella removed.  Off CVVH post-removal, he developed hyperkalemia and wide QRS rhythm requiring transient external pacing, also with suspected septic shock (low SVR, fever, rising WBCs).  Doing better this morning.  On dobutamine 5, NE 30, vasopressin 0.04, epinephrine 5 with co-ox 65%.  SVR higher this morning on Flotrak (729).  CVP 12 with weight up 7 lbs, now able to pull via CVVH.  - Would aim for UF -50 cc/hr net via CVVH. - Slow wean of epinephrine and NE.  - Not candidate to replace Impella (at this point, not candidate for LVAD or transplant).  Will need to wean pressors slowly as best as we are able.  He can stay on  dobutamine long-term.  2. Atrial fibrillation: H/o PAF.  He was on dofetilide in the remote past but this was stopped due to noncompliance.  He had an upper GI bleed from antral ulcers in 3/12. He was seen by GI and was deemed safe to restart anticoagulation as long as he remains on a PPI.  No apparent recurrence of AF until just prior to this admission, was cardioverted back to NSR in ER but went back into atrial fibrillation.  Post-op afib, DCCV to NSR/BiV pacing am 12/16 -> back to AF. Rapid atrial pacing back to SR. Maintaining NSR.  - Continue amio 30 mg per hour.   - Restart systemic heparin today.  3. CAD:   Cath this admission with severe 3 vessel disease. CABG x 4 on 12/15.  - Continue atorvastatin, ASA.  4. Acute on chronic hypoxemic respiratory failure: Vent per CCM.  Has tracheostomy.  - CCM following.  5. AKI on CKD stage 3: Likely ATN/cardiorenal. Anuric. On CVVH.    - Aim for net negative 50 cc/hr.  - Suspect he is going to be HD dependent.  6. Diabetes: Insulin.  7. Hyponatremia: Resolved with CVVH.  8. Torsades/ICD shock: 12/7/212 in hospital event, in setting of severe hypokalemia and hypomagnesemia.   9. Gout: h/o gout. Complained of rt knee pain  c/w previous flares - treated w/ prednisone burst (completed).  10: ID: Septic shock 1/21 with fever, low SVR, and elevated WBCs. Cultures NGTD.  IJ catheter removed.  - On vancomycin/meropenem.  11. FEN: TFs on hold, restart via J-tube if ok with CCM. 12. Anemia: Stable 8.6 today.  Transfuse < 7.5.  13. Stage II Pressure Ulcer- sacrum -Continue to reposition every 2 hours R/L  14. Pleural Effusion: Loculated rt apical pleural effusion noted on imaging.  - continue UF through CVVH  - PCCM following  15. VT: Amiodarone gtt + mexiletine.    CRITICAL CARE Performed by: Loralie Champagne  Total critical care time: 40 minutes  Critical care time was exclusive of separately billable procedures and treating other patients.  Critical  care was necessary to treat or prevent imminent or life-threatening deterioration.  Critical care was time spent personally by me on the following activities: development of treatment plan with patient and/or surrogate as well as nursing, discussions with consultants, evaluation of patient's response to treatment, examination of patient, obtaining history from patient or surrogate, ordering and performing treatments and interventions, ordering and review of laboratory studies, ordering and review of radiographic studies, pulse oximetry and re-evaluation of patient's condition.  Loralie Champagne 02/19/2020 7:57 AM

## 2020-02-19 NOTE — Progress Notes (Signed)
      [address]       ,[place] [id number]             [phone]      Sedated  BP 103/70   Pulse 63   Temp (!) 94 F (34.4 C) (Axillary)   Resp (!) 22   Ht 6\' 3"  (1.905 m)   Wt 96.3 kg   SpO2 100%   BMI 26.54 kg/m  Off Epi, norepi at 30 Dobutamine @ 5 CVP= 9  Intake/Output Summary (Last 24 hours) at 02/19/2020 1746 Last data filed at 02/19/2020 1700 Gross per 24 hour  Intake 5852.16 ml  Output 4538 ml  Net 1314.16 ml   Stable day  Remo Lipps C. Roxan Hockey, MD [facility] [phone]

## 2020-02-19 NOTE — Progress Notes (Signed)
1 Day Post-Op Procedure(s) (LRB): REMOVAL OF IMPELLA LEFT VENTRICULAR ASSIST DEVICE (N/A) TRANSESOPHAGEAL ECHOCARDIOGRAM (TEE) (N/A) Subjective: Sedated on vent   Objective: Vital signs in last 24 hours: Temp:  [97.6 F (36.4 C)-101.4 F (38.6 C)] 97.6 F (36.4 C) (01/22 0400) Pulse Rate:  [35-98] 72 (01/22 0817) Cardiac Rhythm: Normal sinus rhythm (01/22 0400) Resp:  [12-37] 31 (01/22 0817) BP: (60-121)/(41-78) 116/54 (01/22 0817) SpO2:  [90 %-100 %] 100 % (01/22 0817) Arterial Line BP: (74-152)/(38-81) 114/52 (01/22 0715) FiO2 (%):  [40 %-100 %] 80 % (01/22 0817) Weight:  [96.3 kg] 96.3 kg (01/22 0500)  Hemodynamic parameters for last 24 hours: CVP:  [9 mmHg-15 mmHg] 11 mmHg CO:  [3.1 L/min-10.1 L/min] 8.6 L/min CI:  [1.4 L/min/m2-4.5 L/min/m2] 3.9 L/min/m2  Intake/Output from previous day: 01/21 0701 - 01/22 0700 In: 7023.7 [I.V.:4821.3; Blood:679.6; NG/GT:227.5; IV Piggyback:875.4] Out: 2481 [Drains:130; Blood:100; Chest Tube:110] Intake/Output this shift: No intake/output data recorded.  General appearance: sedated, chronically ill appearing Neurologic: sedated Heart: regular rate and rhythm Lungs: coarse BS bilaterally Wound: VAC in place  Lab Results: Recent Labs    02/17/2020 1510 02/12/2020 1826 02/19/20 0426 02/19/20 0433  WBC 31.7*  --  24.6*  --   HGB 7.5*   < > 8.6* 9.9*  HCT 25.6*   < > 28.4* 29.0*  PLT 502*  --  382  --    < > = values in this interval not displayed.   BMET:  Recent Labs    02/01/2020 2346 02/19/20 0426 02/19/20 0433  NA 130* 131* 133*  K 5.1 5.0 5.1  CL 100 99  --   CO2 18* 20*  --   GLUCOSE 246* 249*  --   BUN 47* 44*  --   CREATININE 1.63* 1.46*  --   CALCIUM 8.1* 8.1*  --     PT/INR: No results for input(s): LABPROT, INR in the last 72 hours. ABG    Component Value Date/Time   PHART 7.294 (L) 02/19/2020 0433   HCO3 25.0 02/19/2020 0433   TCO2 27 02/19/2020 0433   ACIDBASEDEF 2.0 02/19/2020 0433   O2SAT 64.6  02/19/2020 0705   CBG (last 3)  Recent Labs    02/15/2020 1713 02/26/2020 2048 02/27/2020 2341  GLUCAP 181* 185* 212*    Assessment/Plan: S/P Procedure(s) (LRB): REMOVAL OF IMPELLA LEFT VENTRICULAR ASSIST DEVICE (N/A) TRANSESOPHAGEAL ECHOCARDIOGRAM (TEE) (N/A) Remains crtically ill but significant improvemnt from last night  CV- high output septic shock picture, co-ox = 65  On dobutamine @5 , Epi down to 5, norepi at 34 RESP- VDRF, trach  PRVC 24/80%. 5 PEEP   Respiratory acidosis, metabolic acidosis corrected RENAL- oliguric renal failure  Hyperkalemia improved  Back on CCVHD- minimal volume removal until on less inotropic support ENDO- CBG moderately elevated GI - TF on hold currently ID- on Vancomycin and meropenem- WBC down to 24K   LOS: 51 days    Melrose Nakayama 02/19/2020

## 2020-02-19 NOTE — Progress Notes (Addendum)
[place] for Heparin Indication: Afib  Allergies  Allergen Reactions  . Orange Fruit Anaphylaxis, Hives and Other (See Comments)    Per Pt- Blisters around lips and Hives all over, also  . Penicillins Anaphylaxis    Did it involve swelling of the face/tongue/throat, SOB, or low BP? Yes Did it involve sudden or severe rash/hives, skin peeling, or any reaction on the inside of your mouth or nose? Yes Did you need to seek medical attention at a hospital or doctor's office? No When did it last happen?childhood If all above answers are "NO", may proceed with cephalosporin use.  [name] [Empagliflozin] Itching  . [name] [Insulin Glargine] Nausea And Vomiting    Patient Measurements: Height: 6\' 3"  (190.5 cm) Weight: 96.3 kg (212 lb 4.9 oz) IBW/kg (Calculated) : 84.5 Heparin dosing wt: 101 kg  Vital Signs: Temp: 97.6 F (36.4 C) (01/22 0400) Temp Source: Oral (01/22 0400) BP: 104/67 (01/22 1000) Pulse Rate: 64 (01/22 1000)  Labs: Recent Labs    02/17/20 0414 02/17/20 0447 02/17/2020 0305 02/10/2020 0319 02/13/2020 1510 02/09/2020 1704 02/03/2020 1826 02/21/2020 2346 02/19/20 0425 02/19/20 0426 02/19/20 0433  HGB 7.7*   < > 7.6*   < > 7.5*  --    < > 8.6*  --  8.6* 9.9*  HCT 26.0*   < > 25.7*   < > 25.6*  --    < > 28.6*  --  28.4* 29.0*  PLT 425*  --  389  --  502*  --   --   --   --  382  --   HEPARINUNFRC <0.10*  --  <0.10*  --   --   --   --   --  <0.10*  --   --   CREATININE 1.29*   < > 1.18   < > 1.68* 1.76*  --  1.63*  --  1.46*  --    < > = values in this interval not displayed.    Estimated Creatinine Clearance: 71.5 mL/min (A) (by C-G formula based on SCr of 1.46 mg/dL (H)).   Assessment: 50 yo male s/p impella 5.5 placement. Pt started on heparinized purge and systemic heparin. Pt now s/p CABG 12/15 with Impella remaining in place c/b significant bleeding and hematoma requiring opened chest and reexploration in OR  12/16. Chest closed on 12/23, re-opened on 12/24. Underwent cardioversion in OR 12/28, s/p sternal wash out/application of wound VAC. Underwent sternal washout, wound VAC change, followed by sternal closure on 1/5. Chest was re-opened on 1/5 given compromised hemodynamics. Underwent impella removal on 1/21.   Received PRBCs overnight - Hgb stable today at 9.9, plt 382. No s/sx of bleeding. Discussed with CTVS and HF team, okay to start heparin infusion without bolus targeting low goal for Afib. Was previously subtherapeutic on 1000 units/hr of systemic in addition to purge solution.   Goal of Therapy:  Heparin level ~0.3  Monitor platelets by anticoagulation protocol: Yes   Plan:  Continue heparin infusion at 700 units/hr without bolus Order heparin level in 8 hours  F/u daily CBC, heparin level, for s/sx of bleeding  [name], PharmD, [place] Pharmacist  Phone: [phone] 02/19/2020 10:42 AM  Please check AMION for all [place] phone numbers After 10:00 PM, call [place] [phone]

## 2020-02-19 NOTE — Progress Notes (Signed)
NAME:  Corey Palmer, MRN:  [id number], DOB:  [birth year], LOS: 18 ADMISSION DATE:  01/19/2020, CONSULTATION DATE: 01/22/2020 REFERRING MD: Aundra Dubin CHIEF COMPLAINT: Respiratory failure post Impella  HPI/Course in hospital  50 year old man admitted 12/1 with A. fib/RVR, torsades, cardiogenic shock [EF 15] requiring Impella placement (removed 1/21), CABG x 4 c/b cardiac tamponade requiring chest reopening (now with VAC), AKI requiring initiation of CRRT.  Past Medical History:    has a past medical history of Anginal pain ([place]), Anxiety, Arthritis, Automatic implantable cardioverter-defibrillator in situ (2007), Bipolar disorder ([place]), CAD (coronary artery disease), Cancer ([place]) (2013), CHF (congestive heart failure) ([place]), Chondrocalcinosis of right knee (1/63/8466), Chronic systolic heart failure ([place]), Depression, Diabetes uncomplicated adult-type II, Dilated cardiomyopathy ([place]), Dyslipidemia, Dysrhythmia, GERD (gastroesophageal reflux disease), Gout, unspecified, Hepatic steatosis (2011), History of kidney stones, echocardiogram, Hypertension, Obesity (BMI 30.0-34.9), Pacemaker, Paroxysmal atrial fibrillation ([place]), Peptic ulcer, Shortness of breath, and Sleep apnea.  Significant Hospital Events:  12/2 Afib with RVR. Cardioverted in ED. AICD did not fire. Milrinone for low co ox.  12/7 ICD fired, Torsades- likely from low K of 2.8.  12/8 Cath- multivessel disease w/ low CO. Volume overloaded.  12/11 Underwent Impella 5.5 insertion for persistent symptoms of forward failure with low cardiac indices. 12/12 Back to the OR for displacement of Impella. Extubated.  12/15 CABG x4, with LIMA-LAD, SVG-PDA/PLV, SVG-OM 12/16 Emergently opened chest at bedside for tamponade, clot compressing right atrium  12/16 To OR for Exploration of chest due to bleeding  12/19 Started CRRT 12/23 Sternal Closure in OR 12/24 Worsening shock.  Chest reopened at bedside with improvement in hemodynamics.  No mediastinal  hematoma. 12/25 A flutter > rapid a paced to sinus rhythm.  Remains on high-dose pressors, inotropes 12/28 Weaning vasopressor requirements.  For chest washout today.  Back in atrial fibrillation, refractory to increased amiodarone and attempted overdrive pacing.  DCCV to NSR. Tolerating fluid removal via CRRT. 1/03 Appears euvolemic.  Adjusting glycemic control.  Chest still open so not weaning 1/04 Spiking fever, Vanco meropenem resumed after culture sent from blood and sputum. 1/05 Awaiting return to the OR for wound VAC dressing assessment fever curve and white blood cell count improving after antibiotics resumed the day prior, did have chest closed, developed tamponade physiology shortly after had to go back for emergent reopening of chest, return to intensive care and refractory shock 1/06 Wound VAC dressing replaced.  Still in shock.  Starting to wean sedation 1/07 Still on high-dose pressors, CRRT 1/15 Recurrent Vtach, shocked 1/18 Attempted pressure change to P 3 with addition of Dobutamine . MAP goal > 60 1/19 Ongoing attempts at impella wean, on PS 1/20 Stable on vent, following commands with sedation weaned, continued Impella wean attempt 1/21 Impella removed, on FloTrac, WBC 22K, pancultured, Cefepime/Vanc started empirically for low SVR, concern for sepsis  Consults:  PCCM, nephrology, cardiology, cardiothoracic surgery, EP  Procedures:  R PICC 12/2 >> R axillary Impella 5.5 12/11 >> 1/21 ETT 12/15 >> 1/10 R femoral arterial sheath 12/15 >> 12/31 R femoral HD cath 12/19 >> 12/31 Chest tube - right pleural, left pleural , mediastinal 12/23 >> 1/5 R IJ HD cath 12/31 >> R Radial A-Line 1/6 >> Chest tube - L pleural, R pleural, mediastinal 1/10 >> Trach 1/10 >> PEG 1/10 >>  Significant Diagnostic Tests:  12/8 LHC  Elevated R and L heart filling pressures with low CO, severe 3-vessel disease  Micro Data:  1/19 BCx2 -no growth at 3 days 1/21 OR  Cx >>  Antimicrobials:   Levaquin 1/21 (periop ppx) Meropenem 1/21>> Vanc 1/21 >>  Interval history:  Impella removed in OR 1/21, JP and CT x3 in place  FloTrac for monitoring Slightly increased pressor requirements post-OR Vent post-OR PRVC PEEP 5, FiO2 40% WBC 24.6 Cultured, antibiotics initiated CRRT resumed  Objective   Blood pressure (!) 116/54, pulse 72, temperature 97.6 F (36.4 C), temperature source Oral, resp. rate (!) 31, height 6\' 3"  (1.905 m), weight 96.3 kg, SpO2 100 %. CVP:  [9 mmHg-15 mmHg] 11 mmHg CO:  [3.1 L/min-9.5 L/min] 8.6 L/min CI:  [1.4 L/min/m2-4.3 L/min/m2] 3.9 L/min/m2  Vent Mode: PRVC FiO2 (%):  [40 %-100 %] 80 % Set Rate:  [24 bmp-28 bmp] 24 bmp Vt Set:  [510 mL] 510 mL PEEP:  [5 cmH20] 5 cmH20 Plateau Pressure:  [20 cmH20-26 cmH20] 20 cmH20   Intake/Output Summary (Last 24 hours) at 02/19/2020 1009 Last data filed at 02/19/2020 0900 Gross per 24 hour  Intake 6936.77 ml  Output 2848 ml  Net 4088.77 ml   Filed Weights   02/16/20 0444 02/12/2020 0500 02/19/20 0500  Weight: 97.1 kg 93.1 kg 96.3 kg   CVP:  [9 mmHg-15 mmHg] 11 mmHg CO:  [3.1 L/min-9.5 L/min] 8.6 L/min CI:  [1.4 L/min/m2-4.3 L/min/m2] 3.9 L/min/m2  Examination: General: Chronically ill-appearing, responsive.   HEENT: Moist oral mucosa, tracheostomy tube in place Neuro: Sedated, generalized weakness CV: Open chest, S1-S2 appreciated.  Chest tubes in place.  JP drain in place PULM: Rhonchi appreciated  GI: Hypoactive bowel sounds, PEG in place Extremities: Mild edema  skin: Warm, dry, no rashes.  02/19/2019 ABG: 7.29/52/344 Assessment & Plan:   Acute hypoxic and hypercapnic respiratory failure Requires full vent support Shock requiring multiple pressors -Continue full vent support currently on PRVC -Weaning as tolerated  Persistent small right-sided loculated pleural effusions -Volume removal as tolerated  Concern for sepsis with low SVR -Empirically started on antibiotics -Cultured -On  hydrocortisone  Cardiogenic shock -S/p high risk CABG complicated by type I add -Impella discontinued 1/21 -Flo trac in place -Pressors continued-Levophed, epinephrine, dobutamine, midodrine.  MAP goal greater than 65  A. fib with RVR Torsads -Amiodarone drip -Antiarrhythmics per EP team -Replete electrolytes as indicated  Recurrent V. tach NSVT 1/15 requiring defibrillation -On mexiletine, EP continues to follow  Leukocytosis -Has had multiple surgeries -Site of infection is unclear at the present time -Empirically started on meropenem and vancomycin 1/21 -We will continue to follow cultures  Vasa plegia -On midodrine 20 3 times daily  Dysphagia -GJ tube present -Will resume tube feeds once felt to be appropriate  Acute kidney injury on chronic kidney disease stage III Cardiorenal syndrome -On CRRT for ultrafiltration -Fluid removal decreased  Anemia -Likely multifactorial -Related to ongoing acute illness  Prolonged QT -Avoid QT prolonging agents  Diabetes with hyperglycemia -SSI -On insulin  Profound muscular deconditioning postop due to prolonged critical illness - Reengage PT once clinically appropriate   Daily Goals Checklist  Pain/Anxiety/Delirium protocol (if indicated): Precedex/Dilaudid gtt, weaning as able VAP protocol (if indicated): Bundle in place DVT prophylaxis:  Full dose ASA Nutrition Status: TF GI prophylaxis: Pantoprazole Glucose control: Insulin (Basal, Standing, SSI) Mobility/therapy needs: Bedrest  Code Status: Full  Family Communication: Per primary Disposition: ICU  The patient is critically ill with multiple organ systems failure and requires high complexity decision making for assessment and support, frequent evaluation and titration of therapies, application of advanced monitoring technologies and extensive interpretation of multiple databases. Critical Care  Time devoted to patient care services described in this note  independent of APP/resident time (if applicable)  is 30 minutes.   Sherrilyn Rist MD [facility] Personal pager: [phone] If unanswered, please page CCM On-call: [phone]

## 2020-02-19 NOTE — Progress Notes (Addendum)
Pharmacy Antibiotic Note  Corey Palmer is a 50 y.o. male admitted on 01/12/2020 s/p CABG with impella and open chest.    WBC 24.6, afebrile. Scr 1.46 (on CRRT), LA 3.1. Received 2g IV vancomycin at 1615. Still on high levels of pressor support. Discussed with CCM and HF team, will add fungal coverage.  Plan: Vancomycin 2gm x1 then 1gm q24h Meropenem 1g IV every 8 hrs Eraxis 200 mg IV once then 100 mg IV every 24 hours - will avoid fluconazole given previous VT episodes and risk for QTc prolongation Monitor CRRT, WBC, cx results, clinical pic  Height: 6\' 3"  (190.5 cm) Weight: 96.3 kg (212 lb 4.9 oz) IBW/kg (Calculated) : 84.5  Temp (24hrs), Avg:99.8 F (37.7 C), Min:97.6 F (36.4 C), Max:101.4 F (38.6 C)  Recent Labs  Lab 02/02/2020 1533 01/31/2020 1806 02/15/20 0254 02/16/20 0310 02/16/20 1742 02/17/20 0414 02/17/20 1644 02/04/2020 0305 02/22/2020 0808 02/07/2020 1510 02/06/2020 1704 02/01/2020 2346 02/19/20 0426  WBC  --   --    < > 18.2*  --  19.6*  --  22.0*  --  31.7*  --   --  24.6*  CREATININE 1.35*  --    < > 1.24   < > 1.29*   < > 1.18 1.20 1.68* 1.76* 1.63* 1.46*  LATICACIDVEN 1.7 1.4  --   --   --   --   --   --   --   --  3.1*  --   --    < > = values in this interval not displayed.    Estimated Creatinine Clearance: 71.5 mL/min (A) (by C-G formula based on SCr of 1.46 mg/dL (H)).    Allergies  Allergen Reactions  . Orange Fruit Anaphylaxis, Hives and Other (See Comments)    Per Pt- Blisters around lips and Hives all over, also  . Penicillins Anaphylaxis    Did it involve swelling of the face/tongue/throat, SOB, or low BP? Yes Did it involve sudden or severe rash/hives, skin peeling, or any reaction on the inside of your mouth or nose? Yes Did you need to seek medical attention at a hospital or doctor's office? No When did it last happen?childhood If all above answers are "NO", may proceed with cephalosporin use.  [name] [Empagliflozin] Itching  . Basaglar  [name] [Insulin Glargine] Nausea And Vomiting    Antimicrobials this admission: Vanc 12/15 >> 12/31, restart 1/4>1/12, restart 1/21> Meropenem 12/16 >>12/31, restart 1/4 >1/12, restart 1/21> Nystatin 12/22 for oral thrush>12/30  Dose adjustments this admission: N/A   Microbiology results: 12/14 Bcx neg 12/16 Bcx neg MRSA PCR neg  12/20 TA rare yeast  12/23 sternal wound neg 1/4 Bcx: neg 1/4 TA - rare candida albicans 1/9 TA - rare yeast- few candida albicans  1/19 BCx: ngtd  1/21 TA: no orgs  1/21 Impella graft cx: ngtd  1/21 BCx: sent    [name], PharmD, [facility] Pharmacist  Phone: [phone] 02/19/2020 11:00 AM  Please check AMION for all [place] phone numbers After 10:00 PM, call [place] [phone]

## 2020-02-19 NOTE — Progress Notes (Signed)
[facility] for Heparin Indication: Afib  Allergies  Allergen Reactions  . Orange Fruit Anaphylaxis, Hives and Other (See Comments)    Per Pt- Blisters around lips and Hives all over, also  . Penicillins Anaphylaxis    Did it involve swelling of the face/tongue/throat, SOB, or low BP? Yes Did it involve sudden or severe rash/hives, skin peeling, or any reaction on the inside of your mouth or nose? Yes Did you need to seek medical attention at a hospital or doctor's office? No When did it last happen?childhood If all above answers are "NO", may proceed with cephalosporin use.  [name] [Empagliflozin] Itching  . Basaglar Kwikpen [Insulin Glargine] Nausea And Vomiting    Patient Measurements: Height: 6\' 3"  (190.5 cm) Weight: 96.3 kg (212 lb 4.9 oz) IBW/kg (Calculated) : 84.5 Heparin dosing wt: 101 kg  Vital Signs: Temp: 94 F (34.4 C) (01/22 1600) Temp Source: Axillary (01/22 1600) BP: 103/70 (01/22 1700) Pulse Rate: 63 (01/22 1700)  Labs: Recent Labs    02/17/2020 0305 02/07/2020 0319 02/01/2020 1510 02/11/2020 1704 02/24/2020 2346 02/19/20 0425 02/19/20 0426 02/19/20 0433 02/19/20 1640  HGB 7.6*   < > 7.5*   < > 8.6*  --  8.6* 9.9*  --   HCT 25.7*   < > 25.6*   < > 28.6*  --  28.4* 29.0*  --   PLT 389  --  502*  --   --   --  382  --   --   HEPARINUNFRC <0.10*  --   --   --   --  <0.10*  --   --  <0.10*  CREATININE 1.18   < > 1.68*   < > 1.63*  --  1.46*  --  1.09   < > = values in this interval not displayed.    Estimated Creatinine Clearance: 95.8 mL/min (by C-G formula based on SCr of 1.09 mg/dL).   Assessment: 50 yo male s/p impella 5.5 placement. Pt started on heparinized purge and systemic heparin. Pt now s/p CABG 12/15 with Impella remaining in place c/b significant bleeding and hematoma requiring opened chest and reexploration in OR 12/16. Chest closed on 12/23, re-opened on 12/24. Underwent cardioversion in OR 12/28, s/p  sternal wash out/application of wound VAC. Underwent sternal washout, wound VAC change, followed by sternal closure on 1/5. Chest was re-opened on 1/5 given compromised hemodynamics. Underwent impella removal on 1/21.   Received PRBCs overnight - Hgb stable today at 9.9, plt 382. No s/sx of bleeding. Discussed with CTVS and HF team, okay to start heparin infusion without bolus targeting low goal for Afib. Was previously subtherapeutic on 1000 units/hr of systemic in addition to purge solution. HL undetectable on 700 units/hr. Will increase slowly given bleed risk.   Goal of Therapy:  Heparin level ~0.3  Monitor platelets by anticoagulation protocol: Yes   Plan:  Increase heparin infusion to 800 units/hr without bolus F/u 6hr HL  F/u daily CBC, heparin level, for s/sx of bleeding   Benetta Spar, PharmD, BCPS, BCCP Clinical Pharmacist  Please check AMION for all [place] phone numbers After 10:00 PM, call [place]

## 2020-02-19 NOTE — Progress Notes (Signed)
1200:  Ativan 1.25mg  IV wasted in stericycle.  Witnessed by  Laverta Baltimore, RN.  1600:  Ativan 1.25mg  IV wasted in stericycle.  Witnessed by Dario Guardian RN

## 2020-02-19 NOTE — Treatment Plan (Signed)
C/f hypothermia, axillary tem 96, on vanco/mero w/ SVR 435. May need to consider core body temp but has flexiseal and no foley cath.    FloTrack: CVP 7 CO 9.3 CI 4.2 MAP 68 SVR 435  Levo 24 Dobutamine 5 Vasopressin 0.04 CRRT pulling 50/H

## 2020-02-20 ENCOUNTER — Inpatient Hospital Stay (HOSPITAL_COMMUNITY): Payer: BC Managed Care – PPO

## 2020-02-20 DIAGNOSIS — I5023 Acute on chronic systolic (congestive) heart failure: Secondary | ICD-10-CM | POA: Diagnosis not present

## 2020-02-20 DIAGNOSIS — N179 Acute kidney failure, unspecified: Secondary | ICD-10-CM | POA: Diagnosis not present

## 2020-02-20 DIAGNOSIS — I4891 Unspecified atrial fibrillation: Secondary | ICD-10-CM | POA: Diagnosis not present

## 2020-02-20 DIAGNOSIS — R570 Cardiogenic shock: Secondary | ICD-10-CM | POA: Diagnosis not present

## 2020-02-20 DIAGNOSIS — J9601 Acute respiratory failure with hypoxia: Secondary | ICD-10-CM | POA: Diagnosis not present

## 2020-02-20 LAB — POCT I-STAT 7, (LYTES, BLD GAS, ICA,H+H)
Acid-Base Excess: 0 mmol/L (ref 0.0–2.0)
Acid-base deficit: 1 mmol/L (ref 0.0–2.0)
Bicarbonate: 25.9 mmol/L (ref 20.0–28.0)
Bicarbonate: 23.9 mmol/L (ref 20.0–28.0)
Calcium, Ion: 1.29 mmol/L (ref 1.15–1.40)
Calcium, Ion: 1.19 mmol/L (ref 1.15–1.40)
HCT: 28 % — ABNORMAL LOW (ref 39.0–52.0)
HCT: 31 % — ABNORMAL LOW (ref 39.0–52.0)
Hemoglobin: 9.5 g/dL — ABNORMAL LOW (ref 13.0–17.0)
Hemoglobin: 10.5 g/dL — ABNORMAL LOW (ref 13.0–17.0)
O2 Saturation: 100 %
O2 Saturation: 95 %
Patient temperature: 98.2
Patient temperature: 97.1
Potassium: 4.6 mmol/L (ref 3.5–5.1)
Potassium: 4.6 mmol/L (ref 3.5–5.1)
Sodium: 132 mmol/L — ABNORMAL LOW (ref 135–145)
Sodium: 136 mmol/L (ref 135–145)
TCO2: 27 mmol/L (ref 22–32)
TCO2: 25 mmol/L (ref 22–32)
pCO2 arterial: 46.1 mmHg (ref 32.0–48.0)
pCO2 arterial: 39.2 mmHg (ref 32.0–48.0)
pH, Arterial: 7.357 (ref 7.350–7.450)
pH, Arterial: 7.39 (ref 7.350–7.450)
pO2, Arterial: 219 mmHg — ABNORMAL HIGH (ref 83.0–108.0)
pO2, Arterial: 73 mmHg — ABNORMAL LOW (ref 83.0–108.0)

## 2020-02-20 LAB — GLUCOSE, CAPILLARY
Glucose-Capillary: 111 mg/dL — ABNORMAL HIGH (ref 70–99)
Glucose-Capillary: 112 mg/dL — ABNORMAL HIGH (ref 70–99)
Glucose-Capillary: 116 mg/dL — ABNORMAL HIGH (ref 70–99)
Glucose-Capillary: 136 mg/dL — ABNORMAL HIGH (ref 70–99)
Glucose-Capillary: 140 mg/dL — ABNORMAL HIGH (ref 70–99)
Glucose-Capillary: 140 mg/dL — ABNORMAL HIGH (ref 70–99)
Glucose-Capillary: 144 mg/dL — ABNORMAL HIGH (ref 70–99)
Glucose-Capillary: 150 mg/dL — ABNORMAL HIGH (ref 70–99)
Glucose-Capillary: 151 mg/dL — ABNORMAL HIGH (ref 70–99)
Glucose-Capillary: 161 mg/dL — ABNORMAL HIGH (ref 70–99)
Glucose-Capillary: 164 mg/dL — ABNORMAL HIGH (ref 70–99)
Glucose-Capillary: 169 mg/dL — ABNORMAL HIGH (ref 70–99)
Glucose-Capillary: 170 mg/dL — ABNORMAL HIGH (ref 70–99)
Glucose-Capillary: 172 mg/dL — ABNORMAL HIGH (ref 70–99)
Glucose-Capillary: 193 mg/dL — ABNORMAL HIGH (ref 70–99)
Glucose-Capillary: 193 mg/dL — ABNORMAL HIGH (ref 70–99)
Glucose-Capillary: 221 mg/dL — ABNORMAL HIGH (ref 70–99)
Glucose-Capillary: 75 mg/dL (ref 70–99)
Glucose-Capillary: 92 mg/dL (ref 70–99)
Glucose-Capillary: 130 mg/dL — ABNORMAL HIGH (ref 70–99)
Glucose-Capillary: 133 mg/dL — ABNORMAL HIGH (ref 70–99)
Glucose-Capillary: 142 mg/dL — ABNORMAL HIGH (ref 70–99)
Glucose-Capillary: 150 mg/dL — ABNORMAL HIGH (ref 70–99)
Glucose-Capillary: 151 mg/dL — ABNORMAL HIGH (ref 70–99)
Glucose-Capillary: 151 mg/dL — ABNORMAL HIGH (ref 70–99)
Glucose-Capillary: 153 mg/dL — ABNORMAL HIGH (ref 70–99)
Glucose-Capillary: 161 mg/dL — ABNORMAL HIGH (ref 70–99)
Glucose-Capillary: 163 mg/dL — ABNORMAL HIGH (ref 70–99)
Glucose-Capillary: 164 mg/dL — ABNORMAL HIGH (ref 70–99)
Glucose-Capillary: 187 mg/dL — ABNORMAL HIGH (ref 70–99)
Glucose-Capillary: 187 mg/dL — ABNORMAL HIGH (ref 70–99)
Glucose-Capillary: 194 mg/dL — ABNORMAL HIGH (ref 70–99)
Glucose-Capillary: 195 mg/dL — ABNORMAL HIGH (ref 70–99)
Glucose-Capillary: 197 mg/dL — ABNORMAL HIGH (ref 70–99)
Glucose-Capillary: 197 mg/dL — ABNORMAL HIGH (ref 70–99)

## 2020-02-20 LAB — RENAL FUNCTION PANEL
Albumin: 1.6 g/dL — ABNORMAL LOW (ref 3.5–5.0)
Albumin: 1.7 g/dL — ABNORMAL LOW (ref 3.5–5.0)
Anion gap: 11 (ref 5–15)
Anion gap: 9 (ref 5–15)
BUN: 37 mg/dL — ABNORMAL HIGH (ref 6–20)
BUN: 36 mg/dL — ABNORMAL HIGH (ref 6–20)
CO2: 21 mmol/L — ABNORMAL LOW (ref 22–32)
CO2: 23 mmol/L (ref 22–32)
Calcium: 8.3 mg/dL — ABNORMAL LOW (ref 8.9–10.3)
Calcium: 8.2 mg/dL — ABNORMAL LOW (ref 8.9–10.3)
Chloride: 99 mmol/L (ref 98–111)
Chloride: 100 mmol/L (ref 98–111)
Creatinine, Ser: 1.15 mg/dL (ref 0.61–1.24)
Creatinine, Ser: 1.11 mg/dL (ref 0.61–1.24)
GFR, Estimated: >60 mL/min (ref >60)
GFR, Estimated: >60 mL/min (ref >60)
Glucose, Bld: 148 mg/dL — ABNORMAL HIGH (ref 70–99)
Glucose, Bld: 204 mg/dL — ABNORMAL HIGH (ref 70–99)
Phosphorus: 3.6 mg/dL (ref 2.5–4.6)
Phosphorus: 2.6 mg/dL (ref 2.5–4.6)
Potassium: 4.8 mmol/L (ref 3.5–5.1)
Potassium: 4.2 mmol/L (ref 3.5–5.1)
Sodium: 131 mmol/L — ABNORMAL LOW (ref 135–145)
Sodium: 132 mmol/L — ABNORMAL LOW (ref 135–145)

## 2020-02-20 LAB — CBC WITH DIFFERENTIAL/PLATELET
Abs Immature Granulocytes: 0.76 10*3/uL — ABNORMAL HIGH (ref 0.00–0.07)
Basophils Absolute: 0.1 10*3/uL (ref 0.0–0.1)
Basophils Relative: 0 %
Eosinophils Absolute: 0 10*3/uL (ref 0.0–0.5)
Eosinophils Relative: 0 %
HCT: 27 % — ABNORMAL LOW (ref 39.0–52.0)
Hemoglobin: 8.3 g/dL — ABNORMAL LOW (ref 13.0–17.0)
Immature Granulocytes: 3 %
Lymphocytes Relative: 3 %
Lymphs Abs: 0.6 10*3/uL — ABNORMAL LOW (ref 0.7–4.0)
MCH: 27 pg (ref 26.0–34.0)
MCHC: 30.7 g/dL (ref 30.0–36.0)
MCV: 87.9 fL (ref 80.0–100.0)
Monocytes Absolute: 1 10*3/uL (ref 0.1–1.0)
Monocytes Relative: 5 %
Neutro Abs: 20.1 10*3/uL — ABNORMAL HIGH (ref 1.7–7.7)
Neutrophils Relative %: 89 %
Platelets: 380 10*3/uL (ref 150–400)
RBC: 3.07 MIL/uL — ABNORMAL LOW (ref 4.22–5.81)
RDW: 22.7 % — ABNORMAL HIGH (ref 11.5–15.5)
WBC: 22.5 10*3/uL — ABNORMAL HIGH (ref 4.0–10.5)
nRBC: 0.2 % (ref 0.0–0.2)

## 2020-02-20 LAB — CULTURE, RESPIRATORY W GRAM STAIN
Culture: NORMAL
Gram Stain: NONE SEEN

## 2020-02-20 LAB — BASIC METABOLIC PANEL
Anion gap: 10 (ref 5–15)
BUN: 36 mg/dL — ABNORMAL HIGH (ref 6–20)
CO2: 23 mmol/L (ref 22–32)
Calcium: 8.3 mg/dL — ABNORMAL LOW (ref 8.9–10.3)
Chloride: 100 mmol/L (ref 98–111)
Creatinine, Ser: 1.2 mg/dL (ref 0.61–1.24)
GFR, Estimated: >60 mL/min (ref >60)
Glucose, Bld: 191 mg/dL — ABNORMAL HIGH (ref 70–99)
Potassium: 4.6 mmol/L (ref 3.5–5.1)
Sodium: 133 mmol/L — ABNORMAL LOW (ref 135–145)

## 2020-02-20 LAB — POCT I-STAT EG7
Acid-Base Excess: 0 mmol/L (ref 0.0–2.0)
Bicarbonate: 25.4 mmol/L (ref 20.0–28.0)
Calcium, Ion: 1.17 mmol/L (ref 1.15–1.40)
HCT: 29 % — ABNORMAL LOW (ref 39.0–52.0)
Hemoglobin: 9.9 g/dL — ABNORMAL LOW (ref 13.0–17.0)
O2 Saturation: 63 %
Patient temperature: 97.1
Potassium: 4.7 mmol/L (ref 3.5–5.1)
Sodium: 135 mmol/L (ref 135–145)
TCO2: 27 mmol/L (ref 22–32)
pCO2, Ven: 42.9 mmHg — ABNORMAL LOW (ref 44.0–60.0)
pH, Ven: 7.377 (ref 7.250–7.430)
pO2, Ven: 32 mmHg (ref 32.0–45.0)

## 2020-02-20 LAB — MAGNESIUM
Magnesium: 2.4 mg/dL (ref 1.7–2.4)
Magnesium: 2.5 mg/dL — ABNORMAL HIGH (ref 1.7–2.4)

## 2020-02-20 LAB — COOXEMETRY PANEL
Carboxyhemoglobin: 1.5 % (ref 0.5–1.5)
Methemoglobin: 1 % (ref 0.0–1.5)
O2 Saturation: 71.9 %
Total hemoglobin: 8.5 g/dL — ABNORMAL LOW (ref 12.0–16.0)

## 2020-02-20 LAB — LACTIC ACID, PLASMA: Lactic Acid, Venous: 1.3 mmol/L (ref 0.5–1.9)

## 2020-02-20 LAB — HEPARIN LEVEL (UNFRACTIONATED)
Heparin Unfractionated: <0.1 IU/mL — ABNORMAL LOW (ref 0.30–0.70)
Heparin Unfractionated: <0.1 IU/mL — ABNORMAL LOW (ref 0.30–0.70)
Heparin Unfractionated: <0.1 IU/mL — ABNORMAL LOW (ref 0.30–0.70)

## 2020-02-20 LAB — LACTATE DEHYDROGENASE: LDH: 395 U/L — ABNORMAL HIGH (ref 98–192)

## 2020-02-20 LAB — PHOSPHORUS: Phosphorus: 2.6 mg/dL (ref 2.5–4.6)

## 2020-02-20 MED ORDER — PROSOURCE TF PO LIQD
45.0000 mL | Freq: Two times a day (BID) | ORAL | Status: DC
  Administered 2020-02-20 – 2020-02-21 (×2): 45 mL
  Filled 2020-02-20 (×2): qty 45

## 2020-02-20 MED ORDER — VITAL HIGH PROTEIN PO LIQD
1000.0000 mL | ORAL | Status: DC
  Administered 2020-02-20: 1000 mL

## 2020-02-20 MED ORDER — LORAZEPAM 2 MG/ML IJ SOLN
1.0000 mg | INTRAMUSCULAR | Status: DC
  Administered 2020-02-20 – 2020-02-29 (×54): 1 mg via INTRAVENOUS
  Filled 2020-02-20 (×53): qty 1

## 2020-02-20 NOTE — Progress Notes (Signed)
NAME:  Corey Palmer, MRN:  [id number], DOB:  [birth year], LOS: 67 ADMISSION DATE:  01/02/2020, CONSULTATION DATE: 01/11/2020 REFERRING MD: Aundra Dubin CHIEF COMPLAINT: Respiratory failure post Impella  HPI/Course in hospital  50 year old man admitted 12/1 with A. fib/RVR, torsades, cardiogenic shock [EF 15] requiring Impella placement (removed 1/21), CABG x 4 c/b cardiac tamponade requiring chest reopening (now with VAC), AKI requiring on CRRT.  Past Medical History:    has a past medical history of Anginal pain ([place]), Anxiety, Arthritis, Automatic implantable cardioverter-defibrillator in situ (2007), Bipolar disorder ([place]), CAD (coronary artery disease), Cancer ([place]) (2013), CHF (congestive heart failure) ([place]), Chondrocalcinosis of right knee (0/86/5784), Chronic systolic heart failure ([place]), Depression, Diabetes uncomplicated adult-type II, Dilated cardiomyopathy ([place]), Dyslipidemia, Dysrhythmia, GERD (gastroesophageal reflux disease), Gout, unspecified, Hepatic steatosis (2011), History of kidney stones, echocardiogram, Hypertension, Obesity (BMI 30.0-34.9), Pacemaker, Paroxysmal atrial fibrillation ([place]), Peptic ulcer, Shortness of breath, and Sleep apnea.  Significant Hospital Events:  12/2 Afib with RVR. Cardioverted in ED. AICD did not fire. Milrinone for low co ox.  12/7 ICD fired, Torsades- likely from low K of 2.8.  12/8 Cath- multivessel disease w/ low CO. Volume overloaded.  12/11 Underwent Impella 5.5 insertion for persistent symptoms of forward failure with low cardiac indices. 12/12 Back to the OR for displacement of Impella. Extubated.  12/15 CABG x4, with LIMA-LAD, SVG-PDA/PLV, SVG-OM 12/16 Emergently opened chest at bedside for tamponade, clot compressing right atrium  12/16 To OR for Exploration of chest due to bleeding  12/19 Started CRRT 12/23 Sternal Closure in OR 12/24 Worsening shock.  Chest reopened at bedside with improvement in hemodynamics.  No mediastinal  hematoma. 12/25 A flutter > rapid a paced to sinus rhythm.  Remains on high-dose pressors, inotropes 12/28 Weaning vasopressor requirements.  For chest washout today.  Back in atrial fibrillation, refractory to increased amiodarone and attempted overdrive pacing.  DCCV to NSR. Tolerating fluid removal via CRRT. 1/03 Appears euvolemic.  Adjusting glycemic control.  Chest still open so not weaning 1/04 Spiking fever, Vanco meropenem resumed after culture sent from blood and sputum. 1/05 Awaiting return to the OR for wound VAC dressing assessment fever curve and white blood cell count improving after antibiotics resumed the day prior, did have chest closed, developed tamponade physiology shortly after had to go back for emergent reopening of chest, return to intensive care and refractory shock 1/06 Wound VAC dressing replaced.  Still in shock.  Starting to wean sedation 1/07 Still on high-dose pressors, CRRT 1/15 Recurrent Vtach, shocked 1/18 Attempted pressure change to P 3 with addition of Dobutamine . MAP goal > 60 1/19 Ongoing attempts at impella wean, on PS 1/20 Stable on vent, following commands with sedation weaned, continued Impella wean attempt 1/21 Impella removed, on FloTrac, WBC 22K, pancultured, Cefepime/Vanc started empirically for low SVR, concern for sepsis 1/23 antifungals added  Consults:  PCCM, nephrology, cardiology, cardiothoracic surgery, EP  Procedures:  R PICC 12/2 >> R axillary Impella 5.5 12/11 >> 1/21 ETT 12/15 >> 1/10 R femoral arterial sheath 12/15 >> 12/31 R femoral HD cath 12/19 >> 12/31 Chest tube - right pleural, left pleural , mediastinal 12/23 >> 1/5 R IJ HD cath 12/31 >> R Radial A-Line 1/6 >> Chest tube - L pleural, R pleural, mediastinal 1/10 >> Trach 1/10 >> PEG 1/10 >>  Significant Diagnostic Tests:  12/8 LHC  Elevated R and L heart filling pressures with low CO, severe 3-vessel disease  Micro Data:  1/19 BCx2 -no growth at 3 days  1/21 OR Cx  >>  Antimicrobials:  Levaquin 1/21 (periop ppx) Meropenem 1/21>> Vanc 1/21 >> Anidulafungin 1/23>>  Interval history:  Impella removed on 1/21, 3 PM CT x3 in place Flowtrack monitoring Pressure requirement did improve On PRVC  Objective   Blood pressure 104/62, pulse 69, temperature (!) 97.1 F (36.2 C), temperature source Axillary, resp. rate (!) 27, height 6\' 3"  (1.905 m), weight 99.1 kg, SpO2 96 %. CVP:  [5 mmHg-12 mmHg] 9 mmHg CO:  [9.4 L/min] 9.4 L/min CI:  [4.2 L/min/m2] 4.2 L/min/m2  Vent Mode: PRVC FiO2 (%):  [50 %-80 %] 50 % Set Rate:  [14 bmp-24 bmp] 24 bmp Vt Set:  [510 mL] 510 mL PEEP:  [5 cmH20] 5 cmH20 Plateau Pressure:  [20 cmH20-25 cmH20] 24 cmH20   Intake/Output Summary (Last 24 hours) at 02/20/2020 0958 Last data filed at 02/20/2020 0900 Gross per 24 hour  Intake 3809.76 ml  Output 5874 ml  Net -2064.24 ml   Filed Weights   02/02/2020 0500 02/19/20 0500 02/20/20 0500  Weight: 93.1 kg 96.3 kg 99.1 kg   CVP:  [5 mmHg-12 mmHg] 9 mmHg CO:  [9.4 L/min] 9.4 L/min CI:  [4.2 L/min/m2] 4.2 L/min/m2  Examination: General: Chronically ill-appearing, responsive HEENT: Moist oral mucosa, tracheostomy tube in place Neuro: Sedated, generalized weakness CV: Open chest, 1 S2 appreciated 2 chest tubes in place PULM: Fair air entry, reduced at the bases GI: Active bowel sounds, PEG in place Extremities: Mild edema  skin: Warm, dry, no rashes.  02/19/2019 ABG: 7.35 /46 /219      Assessment & Plan:   Acute hypoxic and hypercapnic respiratory failure Continue full vent support Shock requiring multiple pressors -ABG noted -Weaning as tolerated -Continue full vent support  Persistent small right-sided loculated pleural effusion -Volume removal as tolerated  Concern for sepsis with low SVR -On empiric antibiotics -Cultures negative to date -On hydrocortisone -Antifungals added -Ordered fungal cultures-blood culture and galactomannan  Cardiogenic  shock -S/p high risk CABG, complicated by tamponade requiring open chest and omental flap closure -Impella was removed on 1/21 -On pressors Levophed, epinephrine, dobutamine, midodrine -Goal MAP greater than 65  Cardiogenic shock -S/p high risk CABG complicated by type I add -Impella discontinued 1/21 -Flo trac in place -Pressors continued-Levophed, vasopressin, dobutamine, midodrine.  MAP goal greater than 65  Atrial fibrillation with RVR Torsades -Amiodarone loading -Antiarrhythmics per EP team -Continue to monitor and replete electrolytes as needed  Recurrent V. Tach Post NSVT on 1/15 requiring defibrillation -Maintained on mexiletine  Leukocytosis -Trending better -Has had multiple surgeries -Unclear focus of infection but maintained on antibiotics, antifungal added today Follow-up on cultures  Dysphagia -GJ tube present -Resume tube feeds  Acute kidney injury on chronic kidney disease stage III Cardiorenal syndrome -On CRRT for ultrafiltration -Continuing fluid removal  Anemia -Multifactorial -Related to ongoing acute illness  Diabetes with hyperglycemia -SSI -On insulin  Profound muscular deconditioning.  Due to prolonged critical illness -Physical therapy when appropriate  Daily Goals Checklist  Pain/Anxiety/Delirium protocol (if indicated): Precedex/Dilaudid gtt, weaning as able VAP protocol (if indicated): Bundle in place DVT prophylaxis:  Full dose ASA Nutrition Status: TF GI prophylaxis: Pantoprazole Glucose control: Insulin (Basal, Standing, SSI) Mobility/therapy needs: Bedrest  Code Status: Full  Family Communication: Per primary Disposition: ICU  The patient is critically ill with multiple organ systems failure and requires high complexity decision making for assessment and support, frequent evaluation and titration of therapies, application of advanced monitoring technologies and extensive interpretation of multiple databases.  Critical Care  Time devoted to patient care services described in this note independent of APP/resident time (if applicable)  is 31 minutes.   Sherrilyn Rist MD [facility] Personal pager: [phone] If unanswered, please page CCM On-call: [phone]

## 2020-02-20 NOTE — Progress Notes (Signed)
[facility] NEPHROLOGY PROGRESS NOTE  Assessment/ Plan: Pt is a 50 y.o. yo male  with familial cardiomyopathy EF20% s/p redo CABG 85/63/14 complicated by persistent shock, multiple attempts at sternal closure with formation of chest hematoma and tamponade, Afib with RVR, and AKI requiring CRRT.    # Anuric AKI/CKD stage III- presumably due to ischemic ATN in setting of cardiogenic shock s/p CABG.  CRRT started 01/16/20.  All fluids 4K/2.5Ca.  RIJ on 01/28/20. Continue CRRT, no prescription change, UF 50-100 cc an hour.  Hyperkalemia improved.  The line was changed to right femoral catheter. - suggest involving palliative care, consideration of transition to comfort measures  # CAD s/p CABG complicated by cardiogenic shock and post-op hematoma. secondary closure on 12/23 then had to be re opened. Went to the OR for washout on 12/28, bedside washout 02/01/19.  OR 02/23/2020 for closure, then had emergent reopening at the bedside 02/06/2020 d/t anterior chest hematoma again.   # Cardiogenic shock- underlying familial cardiomyopathy s/p redo CABG, EF ~20%. Remains on pressors, inotrope. Midodrine 20 tid.  The Impella was removed on 1/21.  # ABLA- s/p postoperative bleeding into chest cavity s/p re-exploration for tamponade/hematoma. Received multiple blood products  # Atrial fibrillation - s/p DCCV on 12/16. On amiodarone.   # VDRF- per CHF/PCCMsvc. Trach/peg 1/10  #-Leukocytosis/  fever: per primary, started on broad-spectrum antibiotics.  #Hypophosphatemia: Repleted IV phosphate.  Subjective: Seen and examined in ICU.  Running CRRT.  On vent.  Remains critically ill without any new event. Objective Vital signs in last 24 hours: Vitals:   02/20/20 0700 02/20/20 0730 02/20/20 0800 02/20/20 0801  BP: 106/67  107/66 (!) 122/50  Pulse: 66 67 68 68  Resp: (!) 24 (!) 28 (!) 29 (!) 28  Temp:   (!) 97.1 F (36.2 C)   TempSrc:   Axillary   SpO2: 98% 98% 97% 98%  Weight:       Height:       Weight change: 2.8 kg  Intake/Output Summary (Last 24 hours) at 02/20/2020 0901 Last data filed at 02/20/2020 0800 Gross per 24 hour  Intake 3809.76 ml  Output 5597 ml  Net -1787.24 ml       Labs: Basic Metabolic Panel: Recent Labs  Lab 02/19/20 0426 02/19/20 0433 02/19/20 1640 02/20/20 0310 02/20/20 0319  NA 131*   < > 130* 131* 132*  K 5.0   < > 4.8 4.8 4.6  CL 99  --  98 99  --   CO2 20*  --  22 21*  --   GLUCOSE 249*  --  208* 148*  --   BUN 44*  --  37* 37*  --   CREATININE 1.46*  --  1.09 1.15  --   CALCIUM 8.1*  --  8.1* 8.3*  --   PHOS 4.5  --  3.3 3.6  --    < > = values in this interval not displayed.   Liver Function Tests: Recent Labs  Lab 02/16/20 0310 02/16/20 1742 02/19/20 0426 02/19/20 1640 02/20/20 0310  AST 57*  --   --   --   --   ALT 51*  --   --   --   --   ALKPHOS 261*  --   --   --   --   BILITOT 1.2  --   --   --   --   PROT 5.8*  --   --   --   --  ALBUMIN 1.8*   < > 1.5* 1.6* 1.6*   < > = values in this interval not displayed.   No results for input(s): LIPASE, AMYLASE in the last 168 hours. No results for input(s): AMMONIA in the last 168 hours. CBC: Recent Labs  Lab 02/17/20 0414 02/17/20 0447 02/10/2020 0305 02/16/2020 0319 02/01/2020 1510 02/08/2020 1826 02/19/20 0426 02/19/20 0433 02/20/20 0310 02/20/20 0319  WBC 19.6*  --  22.0*  --  31.7*  --  24.6*  --  22.5*  --   NEUTROABS 14.9*  --  17.1*  --   --   --  21.6*  --  20.1*  --   HGB 7.7*   < > 7.6*   < > 7.5*   < > 8.6* 9.9* 8.3* 9.5*  HCT 26.0*   < > 25.7*   < > 25.6*   < > 28.4* 29.0* 27.0* 28.0*  MCV 92.5  --  91.5  --  92.8  --  88.5  --  87.9  --   PLT 425*  --  389  --  502*  --  382  --  380  --    < > = values in this interval not displayed.   Cardiac Enzymes: No results for input(s): CKTOTAL, CKMB, CKMBINDEX, TROPONINI in the last 168 hours. CBG: Recent Labs  Lab 02/19/20 2212 02/19/20 2359 02/20/20 0106 02/20/20 0206 02/20/20 0410   GLUCAP 111* 150* 151* 140* 130*    Iron Studies: No results for input(s): IRON, TIBC, TRANSFERRIN, FERRITIN in the last 72 hours. Studies/Results: DG CHEST PORT 1 VIEW  Result Date: 02/20/2020 CLINICAL DATA:  Postop follow-up EXAM: PORTABLE CHEST 1 VIEW COMPARISON:  Yesterday FINDINGS: Cardiomegaly that is unchanged. Single chamber defibrillator lead into the right ventricle. Right PICC with tip at the SVC. Tracheostomy tube in good position. Unchanged extrapleural opacity at the right apex which was pulmonary and parenchymal by CT. Hazy opacity in the perihilar lungs likely from atelectasis. No visible pneumothorax. IMPRESSION: Unchanged hazy opacity of the bilateral chest likely from atelectasis and pleural fluid based on most recent CT. Electronically Signed   By: Monte Fantasia M.D.   On: 02/20/2020 07:09   DG CHEST PORT 1 VIEW  Result Date: 02/19/2020 CLINICAL DATA:  Coronary artery bypass grafting, respiratory failure EXAM: PORTABLE CHEST 1 VIEW COMPARISON:  02/22/2020 FINDINGS: Tracheostomy overlies its expected position. Right internal jugular hemodialysis catheter has been removed. Pulmonary insufflation is stable. Bilateral chest tubes are in place. No pneumothorax or pleural effusion on the left. On the right, a loculated apicolateral pleural effusion appears stable. Surgical drain and surgical skin staples are again seen within the right chest wall overlying this region. No pneumothorax. Mild cardiomegaly is unchanged. Left subclavian pacemaker defibrillator is unchanged. Mediastinal drain is unchanged. IMPRESSION: Interval removal of right internal jugular hemodialysis catheter. Otherwise stable examination with loculated right apicolateral pleural effusion. Bilateral chest tubes in place without evidence of pneumothorax. Electronically Signed   By: Fidela Salisbury MD   On: 02/19/2020 06:42   DG CHEST PORT 1 VIEW  Result Date: 02/16/2020 CLINICAL DATA:  Removal of Impella device EXAM:  PORTABLE CHEST 1 VIEW COMPARISON:  February 17, 2020 FINDINGS: Impella device has been removed. Tracheostomy catheter tip is 6.8 cm above the carina. Central catheter tip is in the superior vena cava near the cavoatrial junction. Pacemaker lead attached to right ventricle. Drain and skin staples again noted in the right apex with right apical pleural opacity, stable. There  is ill-defined opacity in the right lower lung region which in part may be due to layering pleural effusion. There is mild left base atelectasis. There is stable cardiomegaly. Pulmonary vascularity is normal. No adenopathy. No bone lesions. IMPRESSION: Tube and catheter positions as described without pneumothorax evident. Postoperative change right apex with asymmetric opacity in this area. Suspect layering pleural effusion on the right. Mild left base atelectasis. Stable cardiomegaly. Electronically Signed   By: Lowella Grip III M.D.   On: 02/13/2020 11:12    Medications: Infusions: .  prismasol BGK 4/2.5 500 mL/hr at 02/20/20 0209  .  prismasol BGK 4/2.5 200 mL/hr at 02/19/20 2035  . sodium chloride 1,000 mL (02/21/2020 1829)  . sodium chloride    . amiodarone 30 mg/hr (02/20/20 0800)  . anidulafungin    . dexmedetomidine (PRECEDEX) IV infusion 1 mcg/kg/hr (02/20/20 0800)  . dextrose 40 mL/hr at 02/20/20 7494  . DOBUTamine 5 mcg/kg/min (02/20/20 0800)  . electrolyte-A Stopped (02/03/20 4967)  . epinephrine Stopped (02/19/20 1600)  . feeding supplement (PIVOT 1.5 CAL) Stopped (02/20/2020 0515)  . heparin 900 Units/hr (02/20/20 0800)  . HYDROmorphone 2 mg/hr (02/20/20 0800)  . insulin 1.3 mL/hr at 02/20/20 0800  . lactated ringers 10 mL/hr at 02/20/20 0800  . meropenem (MERREM) IV Stopped (02/20/20 0534)  . norepinephrine (LEVOPHED) Adult infusion 8 mcg/min (02/20/20 0800)  . prismasol BGK 4/2.5 1,500 mL/hr at 02/20/20 0209  . vancomycin Stopped (02/19/20 1621)  . vasopressin 0.03 Units/min (02/20/20 0800)    Scheduled  Medications: . sodium chloride   Intravenous Once  . vitamin C  500 mg Per Tube TID  . aspirin  324 mg Per Tube Daily  . atorvastatin  80 mg Per Tube Daily  . busPIRone  10 mg Per Tube TID  . chlorhexidine gluconate (MEDLINE KIT)  15 mL Mouth Rinse BID  . Chlorhexidine Gluconate Cloth  6 each Topical Daily  . darbepoetin (ARANESP) injection - NON-DIALYSIS  100 mcg Subcutaneous Q Sat-1800  . docusate  100 mg Per Tube BID  . feeding supplement (PROSource TF)  45 mL Per Tube TID  . fiber  1 packet Per Tube BID  . FLUoxetine  40 mg Per Tube BID  . hydrocortisone sod succinate (SOLU-CORTEF) inj  50 mg Intravenous Q6H  . HYDROmorphone  4 mg Per Tube Q6H  . LORazepam  0.25 mg Intravenous Q4H  . mouth rinse  15 mL Mouth Rinse 10 times per day  . melatonin  5 mg Per Tube Daily  . mexiletine  150 mg Per Tube Q8H  . midodrine  20 mg Per Tube TID WC  . multivitamin  1 tablet Per Tube QHS  . nutrition supplement (JUVEN)  1 packet Per Tube BID BM  . pantoprazole (PROTONIX) IV  40 mg Intravenous Q12H  . polyethylene glycol  17 g Per Tube Daily  . sodium chloride flush  10-40 mL Intracatheter Q12H  . thiamine  100 mg Per Tube Daily  . valproic acid  250 mg Per Tube BID    have reviewed scheduled and prn medications.  Physical Exam: General: Critically ill looking male sedated and intubated. Heart:RRR, s1s2 nl Lungs: Coarse breath sound bilateral Abdomen:soft, Non-tender, non-distended Extremities:No edema Dialysis Access:  right groin catheter site clean.  Franky Reier Prasad Chandan Fly 02/20/2020,9:01 AM  LOS: 52 days

## 2020-02-20 NOTE — Progress Notes (Signed)
Tachypnea, MAP=50, svr=290, increased wob noted.  Levophed drip increased and epi restarted.  Dr Aundra Dubin notified.  ABG and BMET drawn per Dr Aundra Dubin.   Dr Karsten Fells notified of ABG results and arrived at bedside to assess.  Orders received.  Will continue to closely monitor. Ativan 1mg  given per order.  Precedex gtts increased.

## 2020-02-20 NOTE — Progress Notes (Signed)
      [address]       [place] [address]             [phone]      Events earlier noted, seen and managed by Dr. Ander Slade  Currently looks better. Sedated BP 115/73   Pulse 70   Temp (!) 96 F (35.6 C)   Resp 11   Ht 6\' 3"  (1.905 m)   Wt 99.1 kg   SpO2 100%   BMI 27.31 kg/m  Dobut @ 5, Vasopressin 0.04, Epi off again, Norepi up to 20 earlier, now back to 8 Will run CVVHD even  Remo Lipps C. Roxan Hockey, MD [facility] [phone]

## 2020-02-20 NOTE — Progress Notes (Signed)
Evaluated patient at bedside  Working hard with his breathing, looked very uncomfortable and dyssynchronous with the vent ABG noted, CXR reviewed Labs reviewed  Increased Ativan to 1mg  Q4 as needed Increased ceiling on precedex   Good response to the Ativan noted  May ned to increase dilaudid- may have developed some tolerance to continuous dilaudid.  Goals of care discussion with spouse at bedside - his likelihood of being able to rehab following this protracted illness is getting poorer with every passing day and he is not remotely close to being stable. This process may be survivable, but , will he ever be able to make a meaningful recovery to a quality of life he would want.  At some , point, we would have done everything we can for him and this may still not be enough for him to survive this process  Palliative care ongoing involvement is crucial

## 2020-02-20 NOTE — Progress Notes (Signed)
2 Days Post-Op Procedure(s) (LRB): REMOVAL OF IMPELLA LEFT VENTRICULAR ASSIST DEVICE (N/A) TRANSESOPHAGEAL ECHOCARDIOGRAM (TEE) (N/A) Subjective: Intubated, sedated  Objective: Vital signs in last 24 hours: Temp:  [93.3 F (34.1 C)-98.2 F (36.8 C)] 97.1 F (36.2 C) (01/23 0800) Pulse Rate:  [36-124] 69 (01/23 0900) Cardiac Rhythm: Normal sinus rhythm (01/23 0800) Resp:  [14-29] 27 (01/23 0900) BP: (96-122)/(50-73) 104/62 (01/23 0900) SpO2:  [95 %-100 %] 96 % (01/23 0900) Arterial Line BP: (110-134)/(42-55) 117/47 (01/23 0900) FiO2 (%):  [50 %-80 %] 50 % (01/23 0801) Weight:  [99.1 kg] 99.1 kg (01/23 0500)  Hemodynamic parameters for last 24 hours: CVP:  [5 mmHg-13 mmHg] 9 mmHg CO:  [9.4 L/min] 9.4 L/min CI:  [4.2 L/min/m2] 4.2 L/min/m2  Intake/Output from previous day: 01/22 0701 - 01/23 0700 In: 4161 [I.V.:3061; IV Piggyback:760.1] Out: 8115 [Drains:10; Stool:50; Chest Tube:40] Intake/Output this shift: Total I/O In: 132 [I.V.:132] Out: 577 [Other:577]  General appearance: intubated Neurologic: sedated Heart: regular rate and rhythm Lungs: clear to auscultation bilaterally Abdomen: nondistended Wound: VAC in place  Lab Results: Recent Labs    02/19/20 0426 02/19/20 0433 02/20/20 0310 02/20/20 0319  WBC 24.6*  --  22.5*  --   HGB 8.6*   < > 8.3* 9.5*  HCT 28.4*   < > 27.0* 28.0*  PLT 382  --  380  --    < > = values in this interval not displayed.   BMET:  Recent Labs    02/19/20 1640 02/20/20 0310 02/20/20 0319  NA 130* 131* 132*  K 4.8 4.8 4.6  CL 98 99  --   CO2 22 21*  --   GLUCOSE 208* 148*  --   BUN 37* 37*  --   CREATININE 1.09 1.15  --   CALCIUM 8.1* 8.3*  --     PT/INR: No results for input(s): LABPROT, INR in the last 72 hours. ABG    Component Value Date/Time   PHART 7.357 02/20/2020 0319   HCO3 25.9 02/20/2020 0319   TCO2 27 02/20/2020 0319   ACIDBASEDEF 2.0 02/19/2020 0433   O2SAT 100.0 02/20/2020 0319   CBG (last 3)   Recent Labs    02/20/20 0106 02/20/20 0206 02/20/20 0410  GLUCAP 151* 140* 130*    Assessment/Plan: S/P Procedure(s) (LRB): REMOVAL OF IMPELLA LEFT VENTRICULAR ASSIST DEVICE (N/A) TRANSESOPHAGEAL ECHOCARDIOGRAM (TEE) (N/A) -Relatively stable over past 24 hours CV- continues to have high CO  On 5 of dobutamine, norepi down to 8  RESP- VDRF, trach  PRVC 24/50% and 5 PEEP  Vent per CCM RENAL- on CVVHD  1.6 L - over past 24 hours, K= 4.6 ENDO- CBG modestly elevated GI- will resume Tf today ID- on Vanco/ Meropenem  LOS: 52 days    Corey Palmer 02/20/2020

## 2020-02-20 NOTE — Progress Notes (Signed)
[place] for Heparin Indication: Afib  Allergies  Allergen Reactions  . Orange Fruit Anaphylaxis, Hives and Other (See Comments)    Per Pt- Blisters around lips and Hives all over, also  . Penicillins Anaphylaxis    Did it involve swelling of the face/tongue/throat, SOB, or low BP? Yes Did it involve sudden or severe rash/hives, skin peeling, or any reaction on the inside of your mouth or nose? Yes Did you need to seek medical attention at a hospital or doctor's office? No When did it last happen?childhood If all above answers are "NO", may proceed with cephalosporin use.  [name] [Empagliflozin] Itching  . [name] [Insulin Glargine] Nausea And Vomiting    Patient Measurements: Height: 6\' 3"  (190.5 cm) Weight: 99.1 kg (218 lb 7.6 oz) IBW/kg (Calculated) : 84.5 Heparin dosing wt: 101 kg  Vital Signs: Temp: 96.2 F (35.7 C) (01/23 2000) Temp Source: Axillary (01/23 2000) BP: 111/70 (01/23 2100) Pulse Rate: 67 (01/23 2130)  Labs: Recent Labs    01/29/2020 1510 02/20/2020 1704 02/19/20 0426 02/19/20 0433 02/19/20 2357 02/20/20 0310 02/20/20 0319 02/20/20 0958 02/20/20 1116 02/20/20 1121 02/20/20 1144 02/20/20 1600 02/20/20 2029  HGB 7.5*   < > 8.6*   < >  --  8.3* 9.5*  --  9.9* 10.5*  --   --   --   HCT 25.6*   < > 28.4*   < >  --  27.0* 28.0*  --  29.0* 31.0*  --   --   --   PLT 502*  --  382  --   --  380  --   --   --   --   --   --   --   HEPARINUNFRC  --    < >  --    < > <0.10*  --   --  <0.10*  --   --   --   --  <0.10*  CREATININE 1.68*   < > 1.46*   < >  --  1.15  --   --   --   --  1.20 1.11  --    < > = values in this interval not displayed.    Estimated Creatinine Clearance: 94.1 mL/min (by C-G formula based on SCr of 1.11 mg/dL).   Assessment: 50 yo male s/p impella 5.5 placement. Pt started on heparinized purge and systemic heparin. Pt now s/p CABG 12/15 with Impella remaining in place c/b  significant bleeding and hematoma requiring opened chest and reexploration in OR 12/16. Chest closed on 12/23, re-opened on 12/24. Underwent cardioversion in OR 12/28, s/p sternal wash out/application of wound VAC. Underwent sternal washout, wound VAC change, followed by sternal closure on 1/5. Chest was re-opened on 1/5 given compromised hemodynamics. Underwent impella removal on 1/21.   Heparin level this morning came back undetectable, on 900 units/hr. Hgb 9.5, plt 380. LDH 395. No s/sx of bleeding or infusion issues per convo with nursing. Will continue to increase slowly given bleeding risk.   PM f/u > heparin level <0.1.  No overt bleeding or complications noted.  Goal of Therapy:  Heparin level ~0.3  Monitor platelets by anticoagulation protocol: Yes   Plan:  Increase heparin infusion to 1100 units/hr without bolus F/u  AM heparin level.  F/u daily CBC, heparin level, for s/sx of bleeding  [name], [name] Reason, [facility] Clinical Pharmacist  02/20/2020 9:54 PM   [facility] pharmacy phone numbers are listed  on [email or web address]

## 2020-02-20 NOTE — Treatment Plan (Signed)
Trach and sedated on dilaudid 2, precedex 1.2, dobutamine 5, NE 8, vasopressin 0.04. CRRT keeping even, not pulling.  CVP 12 CO 9.6 CI 4.3 MAP 73 SVR 463 Temp 96.2 (warm blankets/bair hugger)  No changes to plan at present time.

## 2020-02-20 NOTE — Progress Notes (Signed)
[place] for Heparin Indication: Afib  Allergies  Allergen Reactions  . Orange Fruit Anaphylaxis, Hives and Other (See Comments)    Per Pt- Blisters around lips and Hives all over, also  . Penicillins Anaphylaxis    Did it involve swelling of the face/tongue/throat, SOB, or low BP? Yes Did it involve sudden or severe rash/hives, skin peeling, or any reaction on the inside of your mouth or nose? Yes Did you need to seek medical attention at a hospital or doctor's office? No When did it last happen?childhood If all above answers are "NO", may proceed with cephalosporin use.  [name] [Empagliflozin] Itching  . Basaglar Kwikpen [Insulin Glargine] Nausea And Vomiting    Patient Measurements: Height: 6\' 3"  (190.5 cm) Weight: 99.1 kg (218 lb 7.6 oz) IBW/kg (Calculated) : 84.5 Heparin dosing wt: 101 kg  Vital Signs: Temp: 97.1 F (36.2 C) (01/23 1100) Temp Source: Axillary (01/23 1100) BP: 104/62 (01/23 1100) Pulse Rate: 87 (01/23 1100)  Labs: Recent Labs    02/10/2020 1510 02/20/2020 1704 02/19/20 0426 02/19/20 0433 02/19/20 1640 02/19/20 2357 02/20/20 0310 02/20/20 0319 02/20/20 0958  HGB 7.5*   < > 8.6* 9.9*  --   --  8.3* 9.5*  --   HCT 25.6*   < > 28.4* 29.0*  --   --  27.0* 28.0*  --   PLT 502*  --  382  --   --   --  380  --   --   HEPARINUNFRC  --    < >  --   --  <0.10* <0.10*  --   --  <0.10*  CREATININE 1.68*   < > 1.46*  --  1.09  --  1.15  --   --    < > = values in this interval not displayed.    Estimated Creatinine Clearance: 90.8 mL/min (by C-G formula based on SCr of 1.15 mg/dL).   Assessment: 50 yo male s/p impella 5.5 placement. Pt started on heparinized purge and systemic heparin. Pt now s/p CABG 12/15 with Impella remaining in place c/b significant bleeding and hematoma requiring opened chest and reexploration in OR 12/16. Chest closed on 12/23, re-opened on 12/24. Underwent cardioversion in OR 12/28, s/p  sternal wash out/application of wound VAC. Underwent sternal washout, wound VAC change, followed by sternal closure on 1/5. Chest was re-opened on 1/5 given compromised hemodynamics. Underwent impella removal on 1/21.   Heparin level this morning came back undetectable, on 900 units/hr. Hgb 9.5, plt 380. LDH 395. No s/sx of bleeding or infusion issues per convo with nursing. Will continue to increase slowly given bleeding risk.   Goal of Therapy:  Heparin level ~0.3  Monitor platelets by anticoagulation protocol: Yes   Plan:  Increase heparin infusion to 1000 units/hr without bolus F/u 8 hr HL  F/u daily CBC, heparin level, for s/sx of bleeding  [name], PharmD, [place] Pharmacist  Phone: [phone] 02/20/2020 11:15 AM  Please check AMION for all [place] phone numbers After 10:00 PM, call [place] [phone]

## 2020-02-20 NOTE — Progress Notes (Addendum)
Patient ID: Corey Palmer, male   DOB: 1970/09/21, 50 y.o.   MRN: [id number]     Advanced Heart Failure Rounding Note  PCP-Cardiologist: No primary care provider on file.   Subjective:    - 12/7 Torsades -ICD shock x1.  - 12/11 Impella 5.5 placed.  - 12/12 Impella repositioned in OR - 12/15 CABG x 4 with LIMA-LAD, SVG-PDA/PLV, SVG-OM - 12/16 DCCV afib.  Back to OR to re-open chest and again to evacuate hematoma.  - 12/19 CVVH initiated  - 12/23 Chest closed - 12/24 Chest reopened - 12/26 RAP back into NSR - 12/27 back in atrial fibrillation, unable to rapid atrial pace out.   - 12/28 To OR, chest partially closed and wound vac placed.  DCCV to NSR.  - 12/31 Antibiotics stopped. Swan removed, HD catheter moved from right femoral to right IJ.  - 1/5 To OR for chest closure. Chest reopened emergently at bedside again to evacuate hematoma  - 1/10 To OR: omental flap closure of chest with wound vac, tracheostomy, G-tube, J-tube.    - 1/12 Plastics and EP consulted.  - 1/13 Palliative Care consulted.  - 1/15 Went into VT, Shock 200 J and amiodarone bolus 150.   - 1/16: Milrinone stopped.    - 1/17: Partial closure of chest in OR - 1/18 Switched to dobutamine 4 mcg - 1/21 Impella Extracted.  Developed hyperkalemia when CVVH held with wide QRS rhythm and required transient external pacing.    Co-ox 72% on NE 15, dobutamine 5, vasopressin 0.04. CVP 10, CRRT pulling 50/H, will try to increase if BP tolerates.  Weight pending.  FloTrac CVP 10 CO 9.2 CI 4.1 MAP 73 SVR 478   Objective:   Weight Range: 96.3 kg Body mass index is 26.54 kg/m.   Vital Signs:   Temp:  [93.3 F (34.1 C)-98.2 F (36.8 C)] 97.3 F (36.3 C) (01/23 0400) Pulse Rate:  [36-124] 69 (01/23 0445) Resp:  [12-31] 26 (01/23 0445) BP: (96-120)/(54-78) 107/66 (01/23 0400) SpO2:  [95 %-100 %] 96 % (01/23 0445) Arterial Line BP: (109-134)/(42-63) 127/53 (01/23 0445) FiO2 (%):  [60 %-80 %] 60 % (01/23  0400) Weight:  [96.3 kg] 96.3 kg (01/22 0500) Last BM Date: 02/19/20  Weight change: Filed Weights   02/16/20 0444 02/04/2020 0500 02/19/20 0500  Weight: 97.1 kg 93.1 kg 96.3 kg    Intake/Output:   Intake/Output Summary (Last 24 hours) at 02/20/2020 0456 Last data filed at 02/20/2020 0400 Gross per 24 hour  Intake 4257.19 ml  Output 5768 ml  Net -1510.81 ml      Physical Exam   General:  Sedated, sleeping HEENT: trach, dry MM Neck: supple.   Cor: Regular rate & rhythm. No rubs, gallops or murmurs. Lungs: Diminished lower bases, fine rales RLL Abdomen: Nontender, +distended. Extremities: no cyanosis, clubbing, rash. + dependent edema Neuro: Sleeping, trach and sedated.   Telemetry   SR with rates 70s, personally reviewed.     Labs    CBC Recent Labs    02/19/20 0426 02/19/20 0433 02/20/20 0310 02/20/20 0319  WBC 24.6*  --  22.5*  --   NEUTROABS 21.6*  --  20.1*  --   HGB 8.6*   < > 8.3* 9.5*  HCT 28.4*   < > 27.0* 28.0*  MCV 88.5  --  87.9  --   PLT 382  --  380  --    < > = values in this interval not displayed.   Basic  Metabolic Panel Recent Labs    02/19/20 0426 02/19/20 0433 02/19/20 1640 02/20/20 0310 02/20/20 0319  NA 131*   < > 130* 131* 132*  K 5.0   < > 4.8 4.8 4.6  CL 99  --  98 99  --   CO2 20*  --  22 21*  --   GLUCOSE 249*  --  208* 148*  --   BUN 44*  --  37* 37*  --   CREATININE 1.46*  --  1.09 1.15  --   CALCIUM 8.1*  --  8.1* 8.3*  --   MG 2.3  --   --  2.4  --   PHOS 4.5  --  3.3 3.6  --    < > = values in this interval not displayed.   Liver Function Tests Recent Labs    02/19/20 1640 02/20/20 0310  ALBUMIN 1.6* 1.6*   No results for input(s): LIPASE, AMYLASE in the last 72 hours. Cardiac Enzymes No results for input(s): CKTOTAL, CKMB, CKMBINDEX, TROPONINI in the last 72 hours.  BNP: BNP (last 3 results) Recent Labs    01/07/2020 1619  BNP 577.5*    ProBNP (last 3 results) No results for input(s): PROBNP in the  last 8760 hours.   D-Dimer No results for input(s): DDIMER in the last 72 hours. Hemoglobin A1C No results for input(s): HGBA1C in the last 72 hours. Fasting Lipid Panel No results for input(s): CHOL, HDL, LDLCALC, TRIG, CHOLHDL, LDLDIRECT in the last 72 hours. Thyroid Function Tests No results for input(s): TSH, T4TOTAL, T3FREE, THYROIDAB in the last 72 hours.  Invalid input(s): FREET3  Other results:   Imaging    DG CHEST PORT 1 VIEW  Result Date: 02/19/2020 CLINICAL DATA:  Coronary artery bypass grafting, respiratory failure EXAM: PORTABLE CHEST 1 VIEW COMPARISON:  02/17/2020 FINDINGS: Tracheostomy overlies its expected position. Right internal jugular hemodialysis catheter has been removed. Pulmonary insufflation is stable. Bilateral chest tubes are in place. No pneumothorax or pleural effusion on the left. On the right, a loculated apicolateral pleural effusion appears stable. Surgical drain and surgical skin staples are again seen within the right chest wall overlying this region. No pneumothorax. Mild cardiomegaly is unchanged. Left subclavian pacemaker defibrillator is unchanged. Mediastinal drain is unchanged. IMPRESSION: Interval removal of right internal jugular hemodialysis catheter. Otherwise stable examination with loculated right apicolateral pleural effusion. Bilateral chest tubes in place without evidence of pneumothorax. Electronically Signed   By: Fidela Salisbury MD   On: 02/19/2020 06:42     Medications:     Scheduled Medications: . sodium chloride   Intravenous Once  . vitamin C  500 mg Per Tube TID  . aspirin  324 mg Per Tube Daily  . atorvastatin  80 mg Per Tube Daily  . busPIRone  10 mg Per Tube TID  . chlorhexidine gluconate (MEDLINE KIT)  15 mL Mouth Rinse BID  . Chlorhexidine Gluconate Cloth  6 each Topical Daily  . darbepoetin (ARANESP) injection - NON-DIALYSIS  100 mcg Subcutaneous Q Sat-1800  . docusate  100 mg Per Tube BID  . feeding supplement  (PROSource TF)  45 mL Per Tube TID  . fiber  1 packet Per Tube BID  . FLUoxetine  40 mg Per Tube BID  . hydrocortisone sod succinate (SOLU-CORTEF) inj  50 mg Intravenous Q6H  . HYDROmorphone  4 mg Per Tube Q6H  . LORazepam  0.25 mg Intravenous Q4H  . mouth rinse  15 mL Mouth Rinse  10 times per day  . melatonin  5 mg Per Tube Daily  . mexiletine  150 mg Per Tube Q8H  . midodrine  20 mg Per Tube TID WC  . multivitamin  1 tablet Per Tube QHS  . nutrition supplement (JUVEN)  1 packet Per Tube BID BM  . pantoprazole (PROTONIX) IV  40 mg Intravenous Q12H  . polyethylene glycol  17 g Per Tube Daily  . sodium chloride flush  10-40 mL Intracatheter Q12H  . thiamine  100 mg Per Tube Daily  . valproic acid  250 mg Per Tube BID    Infusions: .  prismasol BGK 4/2.5 500 mL/hr at 02/20/20 0209  .  prismasol BGK 4/2.5 200 mL/hr at 02/19/20 2035  . sodium chloride 1,000 mL (02/08/2020 1829)  . sodium chloride    . amiodarone 30 mg/hr (02/20/20 0400)  . anidulafungin    . dexmedetomidine (PRECEDEX) IV infusion 1 mcg/kg/hr (02/20/20 0400)  . dextrose 40 mL/hr at 02/20/20 0400  . DOBUTamine 5 mcg/kg/min (02/20/20 0400)  . electrolyte-A Stopped (02/03/20 8563)  . epinephrine Stopped (02/19/20 1600)  . feeding supplement (PIVOT 1.5 CAL) Stopped (02/09/2020 0515)  . heparin 900 Units/hr (02/20/20 0400)  . HYDROmorphone 2 mg/hr (02/20/20 0400)  . insulin 0.9 mL/hr at 02/20/20 0400  . lactated ringers 10 mL/hr at 02/20/20 0400  . meropenem (MERREM) IV Stopped (02/19/20 2144)  . norepinephrine (LEVOPHED) Adult infusion 15 mcg/min (02/20/20 0400)  . prismasol BGK 4/2.5 1,500 mL/hr at 02/20/20 0209  . vancomycin Stopped (02/19/20 1621)  . vasopressin 0.04 Units/min (02/20/20 0400)    PRN Medications: sodium chloride, Place/Maintain arterial line **AND** sodium chloride, artificial tears, bisacodyl **OR** bisacodyl, dextrose, docusate, heparin, heparin, HYDROmorphone, lip balm, metoprolol tartrate,  midazolam, polyethylene glycol, sodium chloride flush     Assessment/Plan   1. Cardiogenic shock/acute on chronic systolic CHF: Initially nonischemic cardiomyopathy.  Possible familial cardiomyopathy as both parents had cardiomyopathy and died at around 54. However, [employer] gene testing did not show any common mutation for cardiomyopathy.  However, this admission noted to have severe 3 vessel disease so suspect component of ischemic cardiomyopathy.  [employer] Jude ICD.  Echo in 8/20 with EF 15% and mildly decreased RV function. Echo this admission with EF < 20%, moderate LV dilation, RV mildly reduced, severe LAE, no significant MR. Low output HF with markedly low EF.  He has a long history of cardiomyopathy (20 yrs), tends to minimize symptoms.  Cardiorenal syndrome with creatinine up to 2.2,  stabilized on milrinone and Impella 5.5. SCr 1.44 day of CABG. CABG 12/15.  Post-op shock, back to OR 12/16 to open chest (chest wall compartment syndrome) and again to evacuate hematoma.  TEE post-op with severe RV dysfunction.  CVVHD initiated 12/19 for volume removal in setting of rising Scr/decreased UOP. Suspect ATN. S/p chest closure 12/23. Hemodynamics much worse on 12/24 with rising pressor demands. Co-ox 36%.  Bedside echo severe biventricular dysfunction with marked septal bounce. Chest re-opened 12/24.  Was atrial paced back to NSR on 12/26 with improvement in hemodynamics but back in atrial fibrillation on 12/27 and unable to pace out, DCCV to NSR on 12/28. Went back to OR 1/5 for chest closure but several hours later required emergent re-opening of chest at bedside to evacuate hematoma. To OR again 1/10 with closure of chest with omental flap with G & J tube.  1/17 partial closure. 1/21 Impella removed.  Off CVVH post-removal, he developed hyperkalemia and wide QRS rhythm requiring transient  external pacing, also with suspected septic shock (low SVR, fever, rising WBCs).   - On dobutamine 5, NE 15, vasopressin  0.04 with co-ox 72%.  SVR 478, CVP 10, weight pending.   - Would aim for UF -50 cc/hr net via CVVH. - Slow wean of NE.  - Not candidate to replace Impella (at this point, not candidate for LVAD or transplant).  Will need to wean pressors slowly as best as we are able.  He can stay on dobutamine long-term.  2. Atrial fibrillation: H/o PAF.  He was on dofetilide in the remote past but this was stopped due to noncompliance.  He had an upper GI bleed from antral ulcers in 3/12. He was seen by GI and was deemed safe to restart anticoagulation as long as he remains on a PPI.  No apparent recurrence of AF until just prior to this admission, was cardioverted back to NSR in ER but went back into atrial fibrillation.  Post-op afib, DCCV to NSR/BiV pacing am 12/16 -> back to AF. Rapid atrial pacing back to SR. Maintaining NSR.  - Continue amio 30 mg per hour and on systemic heparin gtt.   3. CAD:   Cath this admission with severe 3 vessel disease. CABG x 4 on 12/15.  - Continue atorvastatin, ASA.  4. Acute on chronic hypoxemic respiratory failure: Vent per CCM.  Has tracheostomy.  - CCM following.  5. AKI on CKD stage 3: Likely ATN/cardiorenal. Anuric. On CVVH.    - Aim for net negative 50 cc/hr.  - Suspect he is going to be HD dependent.  6. Diabetes: Insulin.  7. Hyponatremia: Resolved with CVVH.  8. Torsades/ICD shock: 12/7/212 in hospital event, in setting of severe hypokalemia and hypomagnesemia.   9. Gout: h/o gout. Complained of rt knee pain c/w previous flares - treated w/ prednisone burst (completed).  10: ID: Septic shock 1/21 with fever, low SVR, and elevated WBCs. Cultures NGTD.  IJ catheter removed.  - On vancomycin/meropenem, anidulafungin.  11. FEN: TFs on hold, restart via J-tube if ok with CCM. 12. Anemia: Stable 8.3 today.  Transfuse < 7.5.  13. Stage II Pressure Ulcer- sacrum -Continue to reposition every 2 hours R/L  14. Pleural Effusion: Loculated rt apical pleural effusion noted on  imaging.  - continue UF through CVVH  - PCCM following  15. VT: Amiodarone gtt + mexiletine.    Carlene Coria 02/20/2020 4:56 AM  Patient seen with NP, agree with the above note.   Stable day.  He is now on dobutamine 5, NE 8, vasopressin 0.04.  Co-ox 72%. CVP 10. CVVH UF around -50 cc/hr.   Afebrile on vancomycin/meropenem/anidulafungin.   General: sedated Neck: JVP 9-10, no thyromegaly or thyroid nodule.  Lungs: Decreased at bases.  CV: Nondisplaced PMI.  Heart regular S1/S2, no S3/S4, no murmur.  No peripheral edema.   Abdomen: Soft, nontender, no hepatosplenomegaly, no distention.  Skin: Intact without lesions or rashes.  Neurologic: Wakes up and follows commands per nursing.  Extremities: No clubbing or cyanosis.  HEENT: Normal.   Continue slow wean of NE and vasopressin.  Drop vasopressin to 0.03.   Continue CVVH, goal UF -50 cc/hr today.    Continue antibiotics/antifungal.   Passive range of motion by nursing, PT.   CRITICAL CARE Performed by: Loralie Champagne  Total critical care time: 40 minutes  Critical care time was exclusive of separately billable procedures and treating other patients.  Critical care was necessary to treat or prevent imminent  or life-threatening deterioration.  Critical care was time spent personally by me on the following activities: development of treatment plan with patient and/or surrogate as well as nursing, discussions with consultants, evaluation of patient's response to treatment, examination of patient, obtaining history from patient or surrogate, ordering and performing treatments and interventions, ordering and review of laboratory studies, ordering and review of radiographic studies, pulse oximetry and re-evaluation of patient's condition.  Loralie Champagne 02/20/2020 8:05 AM

## 2020-02-20 NOTE — Progress Notes (Signed)
ANTICOAGULATION CONSULT NOTE - Follow Up Consult  Pharmacy Consult for heparin Indication: atrial fibrillation  Labs: Recent Labs    01/31/2020 0305 02/09/2020 0319 02/17/2020 1510 02/26/2020 1704 02/04/2020 2346 02/19/20 0425 02/19/20 0426 02/19/20 0433 02/19/20 1640 02/19/20 2357  HGB 7.6*   < > 7.5*   < > 8.6*  --  8.6* 9.9*  --   --   HCT 25.7*   < > 25.6*   < > 28.6*  --  28.4* 29.0*  --   --   PLT 389  --  502*  --   --   --  382  --   --   --   HEPARINUNFRC <0.10*  --   --   --   --  <0.10*  --   --  <0.10* <0.10*  CREATININE 1.18   < > 1.68*   < > 1.63*  --  1.46*  --  1.09  --    < > = values in this interval not displayed.    Assessment: 50yo male remains subtherapeutic on heparin after conservative rate changes d/t bleeding risk; no gtt issues or signs of bleeding per RN.  Goal of Therapy:  Heparin level ~0.3 units/ml   Plan:  Will increase heparin gtt by 100 units/hr to 900 units/hr and check level in 6 hours.    Wynona Neat, PharmD, BCPS  02/20/2020,2:15 AM

## 2020-02-21 ENCOUNTER — Encounter (HOSPITAL_COMMUNITY): Payer: Self-pay | Admitting: Cardiothoracic Surgery

## 2020-02-21 DIAGNOSIS — Z9911 Dependence on respirator [ventilator] status: Secondary | ICD-10-CM | POA: Diagnosis not present

## 2020-02-21 DIAGNOSIS — J9601 Acute respiratory failure with hypoxia: Secondary | ICD-10-CM | POA: Diagnosis not present

## 2020-02-21 DIAGNOSIS — I5023 Acute on chronic systolic (congestive) heart failure: Secondary | ICD-10-CM | POA: Diagnosis not present

## 2020-02-21 DIAGNOSIS — Z9889 Other specified postprocedural states: Secondary | ICD-10-CM | POA: Diagnosis not present

## 2020-02-21 DIAGNOSIS — I4891 Unspecified atrial fibrillation: Secondary | ICD-10-CM | POA: Diagnosis not present

## 2020-02-21 DIAGNOSIS — R570 Cardiogenic shock: Secondary | ICD-10-CM | POA: Diagnosis not present

## 2020-02-21 DIAGNOSIS — Z515 Encounter for palliative care: Secondary | ICD-10-CM | POA: Diagnosis not present

## 2020-02-21 DIAGNOSIS — N179 Acute kidney failure, unspecified: Secondary | ICD-10-CM | POA: Diagnosis not present

## 2020-02-21 LAB — COOXEMETRY PANEL
Carboxyhemoglobin: 1.6 % — ABNORMAL HIGH (ref 0.5–1.5)
Carboxyhemoglobin: 2 % — ABNORMAL HIGH (ref 0.5–1.5)
Methemoglobin: 1.1 % (ref 0.0–1.5)
Methemoglobin: 1.1 % (ref 0.0–1.5)
O2 Saturation: 59.2 %
O2 Saturation: 60.9 %
Total hemoglobin: 8.2 g/dL — ABNORMAL LOW (ref 12.0–16.0)
Total hemoglobin: 7.9 g/dL — ABNORMAL LOW (ref 12.0–16.0)

## 2020-02-21 LAB — CULTURE, BLOOD (ROUTINE X 2)
Culture: NO GROWTH
Culture: NO GROWTH
Special Requests: ADEQUATE

## 2020-02-21 LAB — POCT I-STAT 7, (LYTES, BLD GAS, ICA,H+H)
Acid-base deficit: 3 mmol/L — ABNORMAL HIGH (ref 0.0–2.0)
Acid-base deficit: 1 mmol/L (ref 0.0–2.0)
Bicarbonate: 21.9 mmol/L (ref 20.0–28.0)
Bicarbonate: 24.3 mmol/L (ref 20.0–28.0)
Calcium, Ion: 1.13 mmol/L — ABNORMAL LOW (ref 1.15–1.40)
Calcium, Ion: 1.2 mmol/L (ref 1.15–1.40)
HCT: 25 % — ABNORMAL LOW (ref 39.0–52.0)
HCT: 27 % — ABNORMAL LOW (ref 39.0–52.0)
Hemoglobin: 8.5 g/dL — ABNORMAL LOW (ref 13.0–17.0)
Hemoglobin: 9.2 g/dL — ABNORMAL LOW (ref 13.0–17.0)
O2 Saturation: 99 %
O2 Saturation: 98 %
Patient temperature: 96
Patient temperature: 97.5
Potassium: 3.7 mmol/L (ref 3.5–5.1)
Potassium: 4.3 mmol/L (ref 3.5–5.1)
Sodium: 138 mmol/L (ref 135–145)
Sodium: 137 mmol/L (ref 135–145)
TCO2: 23 mmol/L (ref 22–32)
TCO2: 26 mmol/L (ref 22–32)
pCO2 arterial: 35.4 mmHg (ref 32.0–48.0)
pCO2 arterial: 39 mmHg (ref 32.0–48.0)
pH, Arterial: 7.393 (ref 7.350–7.450)
pH, Arterial: 7.401 (ref 7.350–7.450)
pO2, Arterial: 129 mmHg — ABNORMAL HIGH (ref 83.0–108.0)
pO2, Arterial: 105 mmHg (ref 83.0–108.0)

## 2020-02-21 LAB — CBC WITH DIFFERENTIAL/PLATELET
Abs Immature Granulocytes: 0.34 10*3/uL — ABNORMAL HIGH (ref 0.00–0.07)
Basophils Absolute: 0 10*3/uL (ref 0.0–0.1)
Basophils Relative: 0 %
Eosinophils Absolute: 0 10*3/uL (ref 0.0–0.5)
Eosinophils Relative: 0 %
HCT: 25.2 % — ABNORMAL LOW (ref 39.0–52.0)
Hemoglobin: 7.9 g/dL — ABNORMAL LOW (ref 13.0–17.0)
Immature Granulocytes: 2 %
Lymphocytes Relative: 3 %
Lymphs Abs: 0.6 10*3/uL — ABNORMAL LOW (ref 0.7–4.0)
MCH: 27.6 pg (ref 26.0–34.0)
MCHC: 31.3 g/dL (ref 30.0–36.0)
MCV: 88.1 fL (ref 80.0–100.0)
Monocytes Absolute: 0.8 10*3/uL (ref 0.1–1.0)
Monocytes Relative: 5 %
Neutro Abs: 14.5 10*3/uL — ABNORMAL HIGH (ref 1.7–7.7)
Neutrophils Relative %: 90 %
Platelets: 329 10*3/uL (ref 150–400)
RBC: 2.86 MIL/uL — ABNORMAL LOW (ref 4.22–5.81)
RDW: 22.8 % — ABNORMAL HIGH (ref 11.5–15.5)
WBC: 16.2 10*3/uL — ABNORMAL HIGH (ref 4.0–10.5)
nRBC: 0.2 % (ref 0.0–0.2)

## 2020-02-21 LAB — PHOSPHORUS: Phosphorus: 2.4 mg/dL — ABNORMAL LOW (ref 2.5–4.6)

## 2020-02-21 LAB — RENAL FUNCTION PANEL
Albumin: 1.7 g/dL — ABNORMAL LOW (ref 3.5–5.0)
Albumin: 1.7 g/dL — ABNORMAL LOW (ref 3.5–5.0)
Anion gap: 9 (ref 5–15)
Anion gap: 10 (ref 5–15)
BUN: 34 mg/dL — ABNORMAL HIGH (ref 6–20)
BUN: 41 mg/dL — ABNORMAL HIGH (ref 6–20)
CO2: 22 mmol/L (ref 22–32)
CO2: 22 mmol/L (ref 22–32)
Calcium: 8.3 mg/dL — ABNORMAL LOW (ref 8.9–10.3)
Calcium: 8 mg/dL — ABNORMAL LOW (ref 8.9–10.3)
Chloride: 101 mmol/L (ref 98–111)
Chloride: 101 mmol/L (ref 98–111)
Creatinine, Ser: 1.16 mg/dL (ref 0.61–1.24)
Creatinine, Ser: 1.14 mg/dL (ref 0.61–1.24)
GFR, Estimated: >60 mL/min (ref >60)
GFR, Estimated: >60 mL/min (ref >60)
Glucose, Bld: 169 mg/dL — ABNORMAL HIGH (ref 70–99)
Glucose, Bld: 188 mg/dL — ABNORMAL HIGH (ref 70–99)
Phosphorus: 2.7 mg/dL (ref 2.5–4.6)
Phosphorus: 2.5 mg/dL (ref 2.5–4.6)
Potassium: 4.3 mmol/L (ref 3.5–5.1)
Potassium: 4 mmol/L (ref 3.5–5.1)
Sodium: 132 mmol/L — ABNORMAL LOW (ref 135–145)
Sodium: 133 mmol/L — ABNORMAL LOW (ref 135–145)

## 2020-02-21 LAB — GLUCOSE, CAPILLARY
Glucose-Capillary: 157 mg/dL — ABNORMAL HIGH (ref 70–99)
Glucose-Capillary: 160 mg/dL — ABNORMAL HIGH (ref 70–99)
Glucose-Capillary: 161 mg/dL — ABNORMAL HIGH (ref 70–99)
Glucose-Capillary: 161 mg/dL — ABNORMAL HIGH (ref 70–99)
Glucose-Capillary: 167 mg/dL — ABNORMAL HIGH (ref 70–99)
Glucose-Capillary: 168 mg/dL — ABNORMAL HIGH (ref 70–99)
Glucose-Capillary: 170 mg/dL — ABNORMAL HIGH (ref 70–99)
Glucose-Capillary: 173 mg/dL — ABNORMAL HIGH (ref 70–99)
Glucose-Capillary: 173 mg/dL — ABNORMAL HIGH (ref 70–99)
Glucose-Capillary: 178 mg/dL — ABNORMAL HIGH (ref 70–99)
Glucose-Capillary: 181 mg/dL — ABNORMAL HIGH (ref 70–99)
Glucose-Capillary: 187 mg/dL — ABNORMAL HIGH (ref 70–99)
Glucose-Capillary: 195 mg/dL — ABNORMAL HIGH (ref 70–99)
Glucose-Capillary: 240 mg/dL — ABNORMAL HIGH (ref 70–99)
Glucose-Capillary: 516 mg/dL (ref 70–99)

## 2020-02-21 LAB — HEMOGLOBIN AND HEMATOCRIT, BLOOD
HCT: 26.8 % — ABNORMAL LOW (ref 39.0–52.0)
Hemoglobin: 8 g/dL — ABNORMAL LOW (ref 13.0–17.0)

## 2020-02-21 LAB — HEPARIN LEVEL (UNFRACTIONATED)
Heparin Unfractionated: <0.1 IU/mL — ABNORMAL LOW (ref 0.30–0.70)
Heparin Unfractionated: <0.1 IU/mL — ABNORMAL LOW (ref 0.30–0.70)
Heparin Unfractionated: 0.16 IU/mL — ABNORMAL LOW (ref 0.30–0.70)

## 2020-02-21 LAB — MAGNESIUM
Magnesium: 2.5 mg/dL — ABNORMAL HIGH (ref 1.7–2.4)
Magnesium: 2.3 mg/dL (ref 1.7–2.4)

## 2020-02-21 MED ORDER — INSULIN ASPART 100 UNIT/ML ~~LOC~~ SOLN
2.0000 [IU] | SUBCUTANEOUS | Status: DC
  Administered 2020-02-21 – 2020-02-22 (×6): 2 [IU] via SUBCUTANEOUS

## 2020-02-21 MED ORDER — PROSOURCE TF PO LIQD
45.0000 mL | Freq: Three times a day (TID) | ORAL | Status: DC
  Administered 2020-02-21 – 2020-03-07 (×45): 45 mL
  Filled 2020-02-21 (×44): qty 45

## 2020-02-21 MED ORDER — INSULIN DETEMIR 100 UNIT/ML ~~LOC~~ SOLN
8.0000 [IU] | Freq: Two times a day (BID) | SUBCUTANEOUS | Status: DC
  Administered 2020-02-21 (×2): 8 [IU] via SUBCUTANEOUS
  Filled 2020-02-21 (×4): qty 0.08

## 2020-02-21 MED ORDER — DEXTROSE 10 % IV SOLN: INTRAVENOUS | Status: DC | PRN

## 2020-02-21 MED ORDER — PIVOT 1.5 CAL PO LIQD
1000.0000 mL | ORAL | Status: DC
  Administered 2020-02-21 – 2020-02-27 (×11): 1000 mL

## 2020-02-21 MED ORDER — INSULIN ASPART 100 UNIT/ML ~~LOC~~ SOLN
2.0000 [IU] | SUBCUTANEOUS | Status: DC
  Administered 2020-02-21: 4 [IU] via SUBCUTANEOUS
  Administered 2020-02-21: 6 [IU] via SUBCUTANEOUS
  Administered 2020-02-22: 4 [IU] via SUBCUTANEOUS
  Administered 2020-02-22: 6 [IU] via SUBCUTANEOUS
  Administered 2020-02-22: 4 [IU] via SUBCUTANEOUS

## 2020-02-21 NOTE — Progress Notes (Signed)
Nutrition Follow-up  DOCUMENTATION CODES:   Not applicable  INTERVENTION:   Tube Feeding via J-tube: - Change to Pivot 1.5'@70ml' /hr(1680 ml/day) - Pro-Source TF45 mlTID  Tube feeding regimen provides 2640 kcal, 191 grams of protein, and 1276 ml of H2O.   - Continue renal MVI daily  - Continue Juven BID per tube, each packet provides 80 calories, 8 grams of carbohydrate, 2.5  grams of protein, 7 grams of L-arginine and 7 grams of L-glutamine; supplement contains CaHMB, Vitamins C, E, B12 and Zinc to promote wound healing  NUTRITION DIAGNOSIS:   Inadequate oral intake related to acute illness as evidenced by NPO status.  Ongoing  GOAL:   Patient will meet greater than or equal to 90% of their needs  Met via TF  MONITOR:   Vent status,Labs,Weight trends,Skin,I & O's  REASON FOR ASSESSMENT:   Consult Enteral/tube feeding initiation and management  ASSESSMENT:   50 yo male admitted with Afib with RVR requiring cardioversion, ischemic CM requiring inotropes and impella support and ultimately underwent CABG x 4 on 12/15 with Impella still in place. PMH includes CAD, benign stomach cancer, CHF, DM, depression, CM, GERD, HTN  12/11 - impella LVAD placed 12/15 - s/p redo sternotomy, CABG on pump x 4, intubated 12/16 - s/p bedside chest exploration for tamponade with moderate amount of anterior mediastinal clot removed, OR for bleeding s/p CABG for mediastinal washout 12/17 - Cortrak placed (duodenal) 12/19 - CRRT initiated 12/23 - sternal closure  12/24 - worsening shock, chest reopened at bedside 12/28 - OR for washout, wound VAC 01/04 - bedside washout 01/05 - OR for sternal washout and closure, reopened later due to compromised hemodynamics due to mediastinal hematoma, wound VAC replaced 01/10 - sternal washout with wound VAC change, greater omental flap closure of sternum, trach, open G-tube, J-tube 01/17 - OR for muscle flap partial sternal closure by  TCTS/plastics, placement of VAC sponge and ABRA device 01/19 - TC trial (~2 hours) 01/21 - OR for impella removal, FloTrac monitoring system placed, TF held 01/23 - TF resumed  RD consulted for tube feeding initiation and management. Adult ICU Tube Feeding Protocol ordered by CCM on 1/23. RD to adjust to better meet pt's needs.  CRRT ongoing, pulling even at this time. Per notes, hemodynamics improved and pt is off levophed. Plan is to wean vasopressin and continue dobutamine.  Noted that family would like to get all disciplines together for a meeting to discuss pt's status. Palliative Care is involved.  Admit weight: 119.4 kg Current weight: 95.2 kg  Weight continues to trend down. Suspect some dry weight loss present in addition to weight loss related to negative fluid balance.  Patient remains on ventilator support via trach. MV: 13.3 L/min Temp (24hrs), Avg:97.5 F (36.4 C), Min:96 F (35.6 C), Max:98.8 F (37.1 C) BP (a-line): 122/51 MAP (a-line): 68  Drips: Precedex Dobutamine Amiodarone Heparin Dilaudid Insulin: 1.6 ml/hr LR: 10 ml/hr Vasopressin  Medications reviewed and include: vitamin C 500 mg TID, aranesp, colace, nutrisource fiber BID, solu-cortef, SSI q 4 hours, novolog 2 units q 4 hours, levemir 8 units q 12 hours, rena-vit, Juven BID, IV protonix, miralax, thiamine, IV eraxis, IV abx  Labs reviewed: magnesium 2.5, hemoglobin 9.2 CBG's: 133-197 x 24 hours  JP drain: 10 ml x 24 hours CRRT UF: 3931 ml x 24 hours Stool: 650 ml x 24 hours CT: 10 ml x 24 hours I/O's: -45.4 L since admit  Diet Order:   Diet Order  Diet NPO time specified  Diet effective midnight                 EDUCATION NEEDS:   Not appropriate for education at this time  Skin:  Skin Assessment: Skin Integrity Issues: Stage II: buttocks, throat Stage III: penis Unstageable: throat Incisions: chest, right leg Other: skin tear to back  Last BM:  02/21/20 rectal tube  (650 ml x 24 hours)  Height:   Ht Readings from Last 1 Encounters:  01/17/2020 '6\' 3"'  (1.905 m)    Weight:   Wt Readings from Last 1 Encounters:  02/21/20 95.2 kg    Ideal Body Weight:  89 kg  BMI:  Body mass index is 26.23 kg/m.  Estimated Nutritional Needs:   Kcal:  2500-2800 kcals  Protein:  180-200 grams  Fluid:  >/= 1.5 L    Gustavus Bryant, MS, RD, LDN Inpatient Clinical Dietitian Please see [facility] for contact information.

## 2020-02-21 NOTE — Progress Notes (Signed)
Patient ID: Corey Palmer, male   DOB: 1970/02/29, 50 y.o.   MRN: [id number]  This NP visited patient at the bedside as a follow up to last week's [place], for palliative medicine  needs and emotional support and in response to patient's wife/[name] to meet with me concerning her husband's current medical situation.  Medical records reviewed   Vonna Kotyk she feels confused and frustrated, she tells me that she is getting "mixed messages" from the medical team.  She is hearing from some providers that this is an end-of-life situation and to prepare for that and then she is hearing from other providers that things are improved and to remain hopeful.  She voices exhaustion in managing her husband's situation and trying to support her family of three children.  " we cannot take this roller coaster"  I shared with her the seriousness of her husband's medical situation.  Education and raised awareness to the limitations of medical interventions to prolong life when the body fails to thrive.  We discussed human mortality  Wife requests a sit down  meeting with the physicians and family members that could be present. " I want to hear it from the different doctors together, so they can hear it too"  She verbalizes appreciation for the care her husband is receiving, she specifically speaks to nursing.      In an attempt to coordinate this meeting I messaged Dr Aundra Dubin who can be present on Wednesday January 26th  at 1:00pm,      hope to hear form surgical team, and will also contact CCM.      Discussed with wife the importance of continued conversation with her family and the  medical providers regarding overall plan of care and treatment options,  ensuring decisions are within the context of the patients values and GOCs.  Questions and concerns addressed  Emotional support offered     Total time spent on the unit was 35 minutes  Greater than 50% of the time was spent in counseling and  coordination of care  Wadie Lessen NP  Palliative Medicine Team Team Phone # [phone] Pager [phone]

## 2020-02-21 NOTE — Progress Notes (Signed)
NAME:  Corey Palmer, MRN:  [id number], DOB:  Dec 11, 1970, LOS: 3 ADMISSION DATE:  01/12/2020, CONSULTATION DATE: 01/11/2020 REFERRING MD: Aundra Dubin CHIEF COMPLAINT: Respiratory failure post Impella  HPI/Course in hospital  50 year old man admitted 12/1 with A. fib/RVR, torsades, cardiogenic shock [EF 15] requiring Impella placement (removed 1/21), CABG x 4 c/b cardiac tamponade requiring chest reopening (now with VAC), AKI requiring on CRRT.  Past Medical History:    has a past medical history of Anginal pain ([place]), Anxiety, Arthritis, Automatic implantable cardioverter-defibrillator in situ (2007), Bipolar disorder ([place]), CAD (coronary artery disease), Cancer ([place]) (2013), CHF (congestive heart failure) ([place]), Chondrocalcinosis of right knee (07/25/3149), Chronic systolic heart failure ([place]), Depression, Diabetes uncomplicated adult-type II, Dilated cardiomyopathy ([place]), Dyslipidemia, Dysrhythmia, GERD (gastroesophageal reflux disease), Gout, unspecified, Hepatic steatosis (2011), History of kidney stones, echocardiogram, Hypertension, Obesity (BMI 30.0-34.9), Pacemaker, Paroxysmal atrial fibrillation ([place]), Peptic ulcer, Shortness of breath, and Sleep apnea.  Significant Hospital Events:  12/2 Afib with RVR. Cardioverted in ED. AICD did not fire. Milrinone for low co ox.  12/7 ICD fired, Torsades- likely from low K of 2.8.  12/8 Cath- multivessel disease w/ low CO. Volume overloaded.  12/11 Underwent Impella 5.5 insertion for persistent symptoms of forward failure with low cardiac indices. 12/12 Back to the OR for displacement of Impella. Extubated.  12/15 CABG x4, with LIMA-LAD, SVG-PDA/PLV, SVG-OM 12/16 Emergently opened chest at bedside for tamponade, clot compressing right atrium  12/16 To OR for Exploration of chest due to bleeding  12/19 Started CRRT 12/23 Sternal Closure in OR 12/24 Worsening shock.  Chest reopened at bedside with improvement in hemodynamics.  No mediastinal  hematoma. 12/25 A flutter > rapid a paced to sinus rhythm.  Remains on high-dose pressors, inotropes 12/28 Weaning vasopressor requirements.  For chest washout today.  Back in atrial fibrillation, refractory to increased amiodarone and attempted overdrive pacing.  DCCV to NSR. Tolerating fluid removal via CRRT. 1/03 Appears euvolemic.  Adjusting glycemic control.  Chest still open so not weaning 1/04 Spiking fever, Vanco meropenem resumed after culture sent from blood and sputum. 1/05 Awaiting return to the OR for wound VAC dressing assessment fever curve and white blood cell count improving after antibiotics resumed the day prior, did have chest closed, developed tamponade physiology shortly after had to go back for emergent reopening of chest, return to intensive care and refractory shock 1/06 Wound VAC dressing replaced.  Still in shock.  Starting to wean sedation 1/07 Still on high-dose pressors, CRRT 1/15 Recurrent Vtach, shocked 1/18 Attempted pressure change to P 3 with addition of Dobutamine . MAP goal > 60 1/19 Ongoing attempts at impella wean, on PS 1/20 Stable on vent, following commands with sedation weaned, continued Impella wean attempt 1/21 Impella removed, on FloTrac, WBC 22K, pancultured, Cefepime/Vanc started empirically for low SVR, concern for sepsis 1/22 antifungals added Impella removed on 1/21, 3 PM CT x3 in place  Consults:  PCCM, nephrology, cardiology, cardiothoracic surgery, EP  Procedures:  R PICC 12/2 >> R axillary Impella 5.5 12/11 >> 1/21 ETT 12/15 >> 1/10 R femoral arterial sheath 12/15 >> 12/31 R femoral HD cath 12/19 >> 12/31 Chest tube - right pleural, left pleural , mediastinal 12/23 >> 1/5 R IJ HD cath 12/31 >> R Radial A-Line 1/6 >> Chest tube - L pleural, R pleural, mediastinal 1/10 >> Trach 1/10 PEG 1/10  Significant Diagnostic Tests:  12/8 LHC  Elevated R and L heart filling pressures with low CO, severe 3-vessel disease  Micro Data:  1/19  BCx2 -no growth at 3 days 1/21 OR Cx >>  Antimicrobials:  Levaquin 1/21 (periop ppx) Meropenem 1/21>> Vanc 1/21 >> Anidulafungin 1/22>>  Interval history:  Patient continued require inotropic and vasopressors No change in condition overall  Objective   Blood pressure (!) 106/59, pulse 76, temperature 98 F (36.7 C), temperature source Axillary, resp. rate (!) 21, height 6\' 3"  (1.905 m), weight 95.2 kg, SpO2 100 %. CVP:  [10 mmHg-22 mmHg] 12 mmHg  Vent Mode: PRVC FiO2 (%):  [40 %-50 %] 40 % Set Rate:  [24 bmp] 24 bmp Vt Set:  [510 mL] 510 mL PEEP:  [5 cmH20] 5 cmH20 Plateau Pressure:  [23 cmH20-30 cmH20] 27 cmH20   Intake/Output Summary (Last 24 hours) at 02/21/2020 1633 Last data filed at 02/21/2020 1600 Gross per 24 hour  Intake 4374.96 ml  Output 5363 ml  Net -988.04 ml   Filed Weights   02/19/20 0500 02/20/20 0500 02/21/20 0600  Weight: 96.3 kg 99.1 kg 95.2 kg   CVP:  [10 mmHg-22 mmHg] 12 mmHg  Examination: General: Chronically ill-appearing, s/p trach HEENT: Moist oral mucosa, tracheostomy tube in place Neuro: Sedated, generalized weakness, not following commands CV: Open chest, 1 S2 appreciated 2 chest tubes in place PULM: Reduced air entry at the bases, no wheezes GI: Active bowel sounds, PEG in place Extremities: Mild edema  skin: Warm, dry, no rashes.  Assessment & Plan:   Acute hypoxic and hypercapnic respiratory failure Continue full vent support Peak, plateau pressure at goal Titrate FiO2  Cardiogenic shock requiring vasopressors and inotropic Question of sepsis due to healthcare associated pneumonia, with low SVR S/p high risk CABG, complicated by tamponade requiring open chest and omental flap closure Impella was removed on 1/21 On dobutamine, midodrine and vasopressin Flowtrack in place Goal MAP greater than 65 Continue IV hydrocortisone Cultures have been negative Continue IV vancomycin, meropenem and antifungal for possible  sepsis Follow-up fungal cultures-blood culture and galactomannan  Persistent small right-sided loculated pleural effusion Continue to monitor  Atrial fibrillation with RVR Torsades Recurrent V. tach Continue amiodarone Antiarrhythmics per EP team Continue to monitor and replete electrolytes as needed Post NSVT on 1/15 requiring defibrillation Maintained on mexiletine  Dysphagia GJ tube present Resume tube feeds  Acute kidney injury on chronic kidney disease stage IIIa Cardiorenal syndrome On CRRT for ultrafiltration  Anemia Multifactorial, Blood loss and critical illness Monitor H&H  Diabetes with hyperglycemia Continue basal and bolus insulin Fingerstick goal 140-180  Profound muscular deconditioning.  Due to prolonged critical illness Physical therapy when appropriate  Daily Goals Checklist  Pain/Anxiety/Delirium protocol (if indicated): Precedex/Dilaudid gtt, weaning as able VAP protocol (if indicated): Bundle in place DVT prophylaxis:  Full dose ASA Nutrition Status: TF GI prophylaxis: Pantoprazole Glucose control: Insulin (Basal, Standing, SSI) Mobility/therapy needs: Bedrest  Code Status: Full  Family Communication: Per primary Disposition: ICU   Total critical care time: 45 minutes  Performed by: Jacky Kindle   Critical care time was exclusive of separately billable procedures and treating other patients.   Critical care was necessary to treat or prevent imminent or life-threatening deterioration.   Critical care was time spent personally by me on the following activities: development of treatment plan with patient and/or surrogate as well as nursing, discussions with consultants, evaluation of patient's response to treatment, examination of patient, obtaining history from patient or surrogate, ordering and performing treatments and interventions, ordering and review of laboratory studies, ordering and review of radiographic studies, pulse oximetry and  re-evaluation  of patient's condition.   Jacky Kindle MD [facility] Pager: [phone] Mobile: [phone]

## 2020-02-21 NOTE — Evaluation (Signed)
Physical Therapy Evaluation Patient Details Name: Corey Palmer MRN: [id number] DOB: July 15, 1970 Today's Date: 02/21/2020   History of Present Illness  50 yo male admitted 12/1 with A. fib/RVR, torsades, cardiogenic shock (EF 15) requiring Impella placement (removed 1/21), CABG x 4 c/b cardiac tamponade requiring chest reopening (now with VAC), AKI requiring initiation of CRRT. PMH including anxiety, arthritis, CAD, CHF, DM type ll, HTN, obesity, pacemaker, paroxysmal a-fib, and sleep apnea.   Clinical Impression   Pt on vent, CRRT, has wound vac to chest, and is lethargic and very deconditioned. Due to CRRT going through R groin line unable to advance mobility to EOB. At this time pt is dependent for rolling and has minimal active movement in all 4 extremities. PT provided PROM x 4 extremities. Pt very swollen and tight with some grimacing at bilat hands and feet with ROM. Acute PT to follow and progress mobility as able/once cleared by MD.     Follow Up Recommendations SNF;Supervision/Assistance - 24 hour (will continue to assess)    Equipment Recommendations   (will continue to access with progression of mobiltiy)    Recommendations for Other Services       Precautions / Restrictions Precautions Precautions: Fall;Sternal Precaution Booklet Issued: No (pt not awake enough to comprehend) Precaution Comments: CRRT via R groin line, Vent on 5 of peep, wound vac on chest Restrictions Weight Bearing Restrictions: Yes Other Position/Activity Restrictions: sternal precautions      Mobility  Bed Mobility               General bed mobility comments: pt dependent for rolling at this time    Transfers                 General transfer comment: unable to attempted due to R groin line for CRRT  Ambulation/Gait             General Gait Details: unable  Stairs            Wheelchair Mobility    Modified Rankin (Stroke Patients Only)       Balance Overall  balance assessment:  (unable due to R groing CRRT line)                                           Pertinent Vitals/Pain Pain Assessment: Faces Faces Pain Scale: Hurts even more Pain Location: grimacing with ROM in L fingers/wrist/hand, bilat ankles and L hip and knee Pain Descriptors / Indicators: Grimacing Pain Intervention(s): Monitored during session    Home Living Family/patient expects to be discharged to:: Private residence Living Arrangements: Spouse/significant other Available Help at Discharge: Family;Available 24 hours/day Type of Home: House Home Access: Level entry     Home Layout: Two level;Able to live on main level with bedroom/bathroom Home Equipment: None Additional Comments: Pt has been in the hospital since 01/01/2020    Prior Function Level of Independence: Independent         Comments: works as Radiographer, therapeutic   Dominant Hand: Right    Extremity/Trunk Assessment   Upper Extremity Assessment Upper Extremity Assessment: Generalized weakness;RUE deficits/detail;LUE deficits/detail LUE Deficits / Details: swollen and tight fingers, feels arthritic, grimacing with ROM, some voluntary mvmt however more shaky, no initiation at shoulder or elbow, limited shld ROM to adhere to sternal precautions, full PROM at elbow  Lower Extremity Assessment Lower Extremity Assessment: RLE deficits/detail;LLE deficits/detail;Generalized weakness RLE Deficits / Details: no hip ROM due to CRRT line, can get to neutral at ankle, knee flexion to about 25 deg to adhere to R hip restrictions LLE Deficits / Details: very swollen and tight, unable to rotate leg to neutral from external rotation, limited hip and knee ROM due to grimacing and tightness    Cervical / Trunk Assessment Cervical / Trunk Assessment:  (been flat on back for over a month)  Communication   Communication: Expressive difficulties;Tracheostomy  Cognition  Arousal/Alertness: Lethargic Behavior During Therapy: Flat affect Overall Cognitive Status: Impaired/Different from baseline Area of Impairment: Following commands;Attention                   Current Attention Level:  (to lethargic to engaged for longer than a couple of seconds)   Following Commands: Follows one step commands with increased time;Follows one step commands inconsistently       General Comments: pt only followed squeeze hand on the L and wiggle toes on the R      General Comments General comments (skin integrity, edema, etc.): VSS, very edematous t/o 4 extremities, wound vac intact at chest    Exercises General Exercises - Upper Extremity Shoulder Flexion: PROM;Both;10 reps;Supine (within sternal precautions) Shoulder ABduction: PROM;Both;10 reps;Supine (within sternal precautions) Elbow Flexion: PROM;Both;10 reps;Seated Elbow Extension: PROM;Both;10 reps;Supine Wrist Flexion: PROM;Both;10 reps;Supine Wrist Extension: PROM;Both;10 reps;Supine General Exercises - Lower Extremity Ankle Circles/Pumps: PROM;Both;10 reps;Supine Heel Slides: PROM;Left;10 reps;Supine (R limited by R hip ROM restriction due to CRRT line)   Assessment/Plan    PT Assessment Patient needs continued PT services  PT Problem List Decreased strength;Decreased range of motion;Decreased activity tolerance;Decreased balance;Decreased mobility;Decreased coordination;Impaired sensation;Decreased skin integrity       PT Treatment Interventions Therapeutic activities;Therapeutic exercise;Functional mobility training;Balance training;Neuromuscular re-education    PT Goals (Current goals can be found in the Care Plan section)  Acute Rehab PT Goals Patient Stated Goal: unable to state PT Goal Formulation: Patient unable to participate in goal setting Time For Goal Achievement: 03/06/20 Potential to Achieve Goals: Fair    Frequency Min 2X/week   Barriers to discharge         Co-evaluation               AM-PAC PT "6 Clicks" Mobility  Outcome Measure Help needed turning from your back to your side while in a flat bed without using bedrails?: Total Help needed moving from lying on your back to sitting on the side of a flat bed without using bedrails?: Total Help needed moving to and from a bed to a chair (including a wheelchair)?: Total Help needed standing up from a chair using your arms (e.g., wheelchair or bedside chair)?: Total Help needed to walk in hospital room?: Total Help needed climbing 3-5 steps with a railing? : Total 6 Click Score: 6    End of Session   Activity Tolerance: Other (comment) (limited by CRRT line and lethargy) Patient left: in bed;with nursing/sitter in room Nurse Communication: Mobility status PT Visit Diagnosis: Muscle weakness (generalized) (M62.81)    Time: 3790-2409 PT Time Calculation (min) (ACUTE ONLY): 19 min   Charges:   PT Evaluation $PT Eval Low Complexity: 1 Low          Kittie Plater, PT, DPT Acute Rehabilitation Services Pager #: [phone] Office #: [phone]   Berline Lopes 02/21/2020, 9:55 AM

## 2020-02-21 NOTE — Progress Notes (Signed)
ANTICOAGULATION CONSULT NOTE - Follow Up Consult  Pharmacy Consult for heparin Indication: atrial fibrillation  Labs: Recent Labs    02/19/20 0426 02/19/20 0433 02/20/20 0310 02/20/20 0319 02/20/20 0958 02/20/20 1116 02/20/20 1121 02/20/20 1144 02/20/20 1600 02/20/20 1710 02/20/20 2029 02/21/20 0420  HGB 8.6*   < > 8.3*   < >  --    < > 10.5*  --   --  8.5*  --  7.9*  HCT 28.4*   < > 27.0*   < >  --    < > 31.0*  --   --  25.0*  --  25.2*  PLT 382  --  380  --   --   --   --   --   --   --   --  329  HEPARINUNFRC  --    < >  --   --  <0.10*  --   --   --   --   --  <0.10* <0.10*  CREATININE 1.46*   < > 1.15  --   --   --   --  1.20 1.11  --   --  1.16   < > = values in this interval not displayed.    Assessment: 50yo male remains subtherapeutic on heparin after conservative rate changes d/t bleeding risk; no gtt issues per RN but she does notice some improving oozing at surgical site.  Goal of Therapy:  Heparin level ~0.3 units/ml   Plan:  Will increase heparin gtt conservatively by 100 units/hr to 1200 units/hr and check level in 6 hours.    Wynona Neat, PharmD, BCPS  02/21/2020,5:52 AM

## 2020-02-21 NOTE — Progress Notes (Signed)
Patient ID: Corey Palmer, male   DOB: December 22, 1970, 50 y.o.   MRN: [id number] EVENING ROUNDS NOTE :     [address]       [place] [address]             [phone]                 3 Days Post-Op Procedure(s) (LRB): REMOVAL OF IMPELLA LEFT VENTRICULAR ASSIST DEVICE (N/A) TRANSESOPHAGEAL ECHOCARDIOGRAM (TEE) (N/A)   14 days postop 02/25/2020 PRE-OPERATIVE DIAGNOSIS:  OPEN CHEST POST-OPERATIVE DIAGNOSIS:  OPEN CHEST PROCEDURE:  Procedure(s): STERNAL WASHOUT WITH WOUND VAC CHANGE (N/A) GREATER OMENTAL FLAP CLOSURE OF STERNUM (N/A) OPEN TRACHEOSTOMY USING #8 SHILEY (N/A) OPEN INSERTION OF 16 FR. BOLUS GASTROSTOMY TUBE (N/A) 16 FR JEJUNOSTOMY INSERTION  (N/A) TRANSESOPHAGEAL ECHOCARDIOGRAM (TEE) (N/A)  40 days postop Date of Procedure:    01/24/2020 Preoperative Diagnosis:      Severe 3-vessel Coronary Artery Disease with severely depressed LV function Postoperative Diagnosis:    Same Procedure:       Redo median sternotomy   coronary Artery Bypass Grafting x 4             Left Internal Mammary Artery to Distal Left Anterior Descending Coronary Artery, Saphenous Vein Graft to Posterior Descending Coronary Artery and right posterior lateral artery as a sequenced graft ; saphenous Vein Graft to Obtuse Marginal Branch of Left Circumflex Coronary Artery; Endoscopic Vein Harvest from right thigh and Lower Leg Completion graft surveillance with indocyanine green fluorescence angiography   Total Length of Stay:  LOS: 53 days  BP (!) 102/58   Pulse 76   Temp 98 F (36.7 C) (Axillary)   Resp 12   Ht 6\' 3"  (1.905 m)   Wt 95.2 kg   SpO2 100%   BMI 26.23 kg/m   .Intake/Output      01/23 0701 01/24 0700 01/24 0701 01/25 0700   I.V. (mL/kg) 2482.9 (26.1) 515.2 (5.4)   Other 460 285   NG/GT 700 250.8   IV Piggyback 629.9    Total Intake(mL/kg) 4272.8 (44.9) 1051.1 (11)   Urine (mL/kg/hr)     Drains 10    Other 3931 724   Stool 650 200   Chest Tube 10    Total  Output 4601 924   Net -328.2 +127.1          .  prismasol BGK 4/2.5 500 mL/hr at 02/21/20 0855  .  prismasol BGK 4/2.5 200 mL/hr at 02/19/20 2035  . sodium chloride 1,000 mL (02/21/2020 1829)  . sodium chloride    . amiodarone 30 mg/hr (02/21/20 1300)  . anidulafungin Stopped (02/20/20 2214)  . dexmedetomidine (PRECEDEX) IV infusion 1 mcg/kg/hr (02/21/20 1300)  . dextrose    . DOBUTamine 5 mcg/kg/min (02/21/20 1300)  . electrolyte-A Stopped (02/03/20 8341)  . epinephrine Stopped (02/19/20 1600)  . feeding supplement (PIVOT 1.5 CAL) 1,000 mL (02/21/20 1155)  . heparin 1,200 Units/hr (02/21/20 1300)  . HYDROmorphone 2 mg/hr (02/21/20 1300)  . insulin 2 mL/hr at 02/21/20 1300  . lactated ringers 10 mL/hr at 02/21/20 1300  . meropenem (MERREM) IV Stopped (02/21/20 0530)  . norepinephrine (LEVOPHED) Adult infusion Stopped (02/21/20 0157)  . prismasol BGK 4/2.5 1,500 mL/hr at 02/21/20 1146  . vancomycin Stopped (02/20/20 1620)  . vasopressin 0.03 Units/min (02/21/20 1300)     Lab Results  Component Value Date   WBC 16.2 (H) 02/21/2020   HGB 9.2 (L) 02/21/2020   HCT 27.0 (  L) 02/21/2020   PLT 329 02/21/2020   GLUCOSE 169 (H) 02/21/2020   CHOL 176 10/01/2018   TRIG 126 10/01/2018   HDL 25 (L) 10/01/2018   LDLDIRECT 116.5 11/01/2011   LDLCALC 126 (H) 10/01/2018   ALT 51 (H) 02/16/2020   AST 57 (H) 02/16/2020   NA 137 02/21/2020   K 4.3 02/21/2020   CL 101 02/21/2020   CREATININE 1.16 02/21/2020   BUN 34 (H) 02/21/2020   CO2 22 02/21/2020   TSH 3.009 10/01/2018   INR 1.3 (H) 02/27/2020   HGBA1C 10.6 (H) 01/03/2020   Little change levaphed off and impella out last Friday Small chest tube drainage   Grace Isaac MD  [place] Office [phone] 02/21/2020 1:25 PM

## 2020-02-21 NOTE — Progress Notes (Signed)
7 days postop Subjective: Patient is sedated  Objective: Vital signs in last 24 hours: Temp:  [96 F (35.6 C)-98.8 F (37.1 C)] 97.2 F (36.2 C) (01/24 0800) Pulse Rate:  [64-99] 77 (01/24 1000) Resp:  [8-27] 16 (01/24 1000) BP: (104-122)/(62-83) 104/65 (01/24 1000) SpO2:  [85 %-100 %] 100 % (01/24 1000) Arterial Line BP: (102-145)/(43-65) 119/50 (01/24 1000) FiO2 (%):  [50 %] 50 % (01/24 0800) Weight:  [95.2 kg] 95.2 kg (01/24 0600) Last BM Date: 02/21/20  Intake/Output from previous day: 01/23 0701 - 01/24 0700 In: 4272.8 [I.V.:2482.9; NG/GT:700; IV Piggyback:629.9] Out: 4601 [Drains:10; Stool:650; Chest Tube:10] Intake/Output this shift: Total I/O In: 617.6 [I.V.:262.6; Other:235; NG/GT:120] Out: 547 [Other:547]  General appearance: Sedated, ill-appearing Incision/Wound: Sternal wound with wound VAC in place, good seal noted.  abra straps in place.  Dressing over right chest wall with JP drain in place.  No erythema noted.  No cellulitic changes noted.  No foul odor is noted.  Lab Results:  CBC Latest Ref Rng & Units 02/21/2020 02/21/2020 02/20/2020  WBC 4.0 - 10.5 K/uL - 16.2(H) -  Hemoglobin 13.0 - 17.0 g/dL 9.2(L) 7.9(L) 8.5(L)  Hematocrit 39.0 - 52.0 % 27.0(L) 25.2(L) 25.0(L)  Platelets 150 - 400 K/uL - 329 -    BMET Recent Labs    02/20/20 1600 02/20/20 1710 02/21/20 0420 02/21/20 0625  NA 132*   < > 132* 137  K 4.2   < > 4.3 4.3  CL 100  --  101  --   CO2 23  --  22  --   GLUCOSE 204*  --  169*  --   BUN 36*  --  34*  --   CREATININE 1.11  --  1.16  --   CALCIUM 8.2*  --  8.3*  --    < > = values in this interval not displayed.   PT/INR No results for input(s): LABPROT, INR in the last 72 hours. ABG Recent Labs    02/20/20 1710 02/21/20 0625  PHART 7.393 7.401  HCO3 21.9 24.3    Studies/Results: DG CHEST PORT 1 VIEW  Result Date: 02/20/2020 CLINICAL DATA:  Postop follow-up EXAM: PORTABLE CHEST 1 VIEW COMPARISON:  Yesterday FINDINGS:  Cardiomegaly that is unchanged. Single chamber defibrillator lead into the right ventricle. Right PICC with tip at the SVC. Tracheostomy tube in good position. Unchanged extrapleural opacity at the right apex which was pulmonary and parenchymal by CT. Hazy opacity in the perihilar lungs likely from atelectasis. No visible pneumothorax. IMPRESSION: Unchanged hazy opacity of the bilateral chest likely from atelectasis and pleural fluid based on most recent CT. Electronically Signed   By: Monte Fantasia M.D.   On: 02/20/2020 07:09    Anti-infectives: Anti-infectives (From admission, onward)   Start     Dose/Rate Route Frequency Ordered Stop   02/20/20 2000  anidulafungin (ERAXIS) 100 mg in sodium chloride 0.9 % 100 mL IVPB        100 mg 78 mL/hr over 100 Minutes Intravenous Every 24 hours 02/19/20 1425     02/19/20 1600  vancomycin (VANCOCIN) IVPB 1000 mg/200 mL premix        1,000 mg 200 mL/hr over 60 Minutes Intravenous Every 24 hours 02/19/20 1056     02/19/20 1600  anidulafungin (ERAXIS) 200 mg in sodium chloride 0.9 % 200 mL IVPB        200 mg 78 mL/hr over 200 Minutes Intravenous  Once 02/19/20 1425 02/19/20 2045   02/19/20 1400  vancomycin (VANCOCIN) IVPB 1000 mg/200 mL premix  Status:  Discontinued        1,000 mg 200 mL/hr over 60 Minutes Intravenous Every 24 hours 02/09/2020 1312 02/04/2020 1633   02/19/20 1400  meropenem (MERREM) 1 g in sodium chloride 0.9 % 100 mL IVPB        1 g 200 mL/hr over 30 Minutes Intravenous Every 8 hours 02/19/20 1056     02/27/2020 1800  meropenem (MERREM) 1 g in sodium chloride 0.9 % 100 mL IVPB  Status:  Discontinued        1 g 200 mL/hr over 30 Minutes Intravenous Every 12 hours 02/03/2020 1620 02/19/20 1056   02/27/2020 1400  ceFEPIme (MAXIPIME) 2 g in sodium chloride 0.9 % 100 mL IVPB  Status:  Discontinued        2 g 200 mL/hr over 30 Minutes Intravenous Every 12 hours 02/15/2020 1312 02/04/2020 1620   02/03/2020 1400  vancomycin (VANCOREADY) IVPB 2000 mg/400 mL         2,000 mg 200 mL/hr over 120 Minutes Intravenous  Once 02/19/2020 1312 02/04/2020 1827   02/07/2020 0859  vancomycin (VANCOCIN) 1,000 mg in sodium chloride 0.9 % 1,000 mL irrigation  Status:  Discontinued          As needed 02/10/2020 0859 02/06/2020 0927   02/11/2020 0800  levofloxacin (LEVAQUIN) IVPB 500 mg        500 mg 100 mL/hr over 60 Minutes Intravenous To Surgery 02/22/2020 0758 02/15/2020 0905   02/10/2020 1115  vancomycin (VANCOREADY) IVPB 1500 mg/300 mL  Status:  Discontinued        1,500 mg 150 mL/hr over 120 Minutes Intravenous NOW 02/22/2020 1103 02/28/2020 1245   02/15/2020 1400  vancomycin (VANCOCIN) IVPB 1000 mg/200 mL premix  Status:  Discontinued        1,000 mg 200 mL/hr over 60 Minutes Intravenous Every 24 hours 02/01/20 1011 02/09/20 0957   02/01/20 1400  meropenem (MERREM) 1 g in sodium chloride 0.9 % 100 mL IVPB  Status:  Discontinued        1 g 200 mL/hr over 30 Minutes Intravenous Every 8 hours 02/01/20 1011 02/09/20 0957   02/01/20 1100  vancomycin (VANCOREADY) IVPB 2000 mg/400 mL        2,000 mg 200 mL/hr over 120 Minutes Intravenous  Once 02/01/20 1011 02/01/20 1327   01/28/20 1300  vancomycin (VANCOCIN) IVPB 1000 mg/200 mL premix  Status:  Discontinued        1,000 mg 200 mL/hr over 60 Minutes Intravenous Every 24 hours 01/27/20 1430 01/28/20 0830   01/24/20 1300  vancomycin (VANCOCIN) IVPB 1000 mg/200 mL premix  Status:  Discontinued        1,000 mg 200 mL/hr over 60 Minutes Intravenous Every 24 hours 01/24/20 0846 01/27/20 1430   01/22/20 1601  vancomycin variable dose per unstable renal function (pharmacist dosing)  Status:  Discontinued         Does not apply See admin instructions 01/22/20 1601 01/24/20 0846   01/21/20 1200  vancomycin (VANCOREADY) IVPB 1250 mg/250 mL  Status:  Discontinued        1,250 mg 166.7 mL/hr over 90 Minutes Intravenous Every 24 hours 12/29/2019 1620 01/22/20 1457   01/12/2020 1800  meropenem (MERREM) 1 g in sodium chloride 0.9 % 100 mL IVPB   Status:  Discontinued        1 g 200 mL/hr over 30 Minutes Intravenous Every 8 hours 01/28/2020 1800 01/28/20 0830  01/11/2020 1348  vancomycin (VANCOCIN) 1,000 mg in sodium chloride 0.9 % 1,000 mL irrigation  Status:  Discontinued          As needed 01/17/2020 1349 12/29/2019 1611   01/12/2020 1015  vancomycin (VANCOREADY) IVPB 1500 mg/300 mL        1,500 mg 150 mL/hr over 120 Minutes Intravenous On call to O.R. 01/18/2020 0927 01/21/2020 1556   01/19/20 1700  vancomycin (VANCOREADY) IVPB 1250 mg/250 mL  Status:  Discontinued        1,250 mg 166.7 mL/hr over 90 Minutes Intravenous Every 24 hours 01/18/20 1618 01/10/2020 1620   01/16/20 1700  vancomycin (VANCOREADY) IVPB 1500 mg/300 mL  Status:  Discontinued        1,500 mg 150 mL/hr over 120 Minutes Intravenous Every 24 hours 01/16/20 1121 01/18/20 1618   01/15/20 1700  vancomycin (VANCOREADY) IVPB 750 mg/150 mL  Status:  Discontinued        750 mg 150 mL/hr over 60 Minutes Intravenous Every 12 hours 01/15/20 1107 01/16/20 1121   01/07/2020 1535  vancomycin (VANCOCIN) powder  Status:  Discontinued          As needed 01/21/2020 1536 01/02/2020 1619   12/29/2019 1400  meropenem (MERREM) 1 g in sodium chloride 0.9 % 100 mL IVPB  Status:  Discontinued        1 g 200 mL/hr over 30 Minutes Intravenous Every 8 hours 01/28/2020 1007 01/01/2020 1800   01/10/2020 1400  levofloxacin (LEVAQUIN) IVPB 500 mg  Status:  Discontinued        500 mg 100 mL/hr over 60 Minutes Intravenous To Surgery 01/09/2020 1354 01/12/2020 1619   01/06/2020 1345  vancomycin (VANCOREADY) IVPB 1500 mg/300 mL  Status:  Discontinued        1,500 mg 150 mL/hr over 120 Minutes Intravenous To Surgery 01/17/2020 1340 12/31/2019 1619   01/03/2020 1345  cefUROXime (ZINACEF) 1.5 g in sodium chloride 0.9 % 100 mL IVPB  Status:  Discontinued        1.5 g 200 mL/hr over 30 Minutes Intravenous To Surgery 01/23/2020 1340 01/07/2020 1353   01/11/2020 1345  cefUROXime (ZINACEF) 750 mg in sodium chloride 0.9 % 100 mL IVPB  Status:   Discontinued        750 mg 200 mL/hr over 30 Minutes Intravenous To Surgery 01/21/2020 1340 01/24/2020 1353   01/11/2020 1002  vancomycin (VANCOCIN) IVPB 1000 mg/200 mL premix  Status:  Discontinued        1,000 mg 200 mL/hr over 60 Minutes Intravenous Every 12 hours 01/26/2020 0948 01/15/20 1049   01/17/2020 1000  levofloxacin (LEVAQUIN) IVPB 750 mg        750 mg 100 mL/hr over 90 Minutes Intravenous Every 24 hours 01/09/2020 1512 01/01/2020 1900   01/27/2020 2130  vancomycin (VANCOCIN) IVPB 1000 mg/200 mL premix        1,000 mg 200 mL/hr over 60 Minutes Intravenous  Once 01/11/2020 1512 01/26/2020 1718   01/22/2020 1018  vancomycin (VANCOCIN) powder  Status:  Discontinued          As needed 01/05/2020 1019 01/07/2020 1511   01/01/2020 0400  vancomycin (VANCOREADY) IVPB 1500 mg/300 mL        1,500 mg 150 mL/hr over 120 Minutes Intravenous To Surgery 01/11/20 1357 01/08/2020 1100   01/01/2020 0400  levofloxacin (LEVAQUIN) IVPB 500 mg        500 mg 100 mL/hr over 60 Minutes Intravenous To Surgery 01/11/20 1357 01/18/2020 1000  Assessment/Plan: s/p Procedure(s): Sternal wound debridement with partial closure, application of ACell, application of wound VAC  Sternal wound appears stable, wound VAC in place with good seal noted.  Plan for additional debridement of sternal wound, placement of ACell, possible additional partial closure with Dr. Marla Roe on 02/27/2020.  We will continue to follow.    LOS: 73 days    Charlies Constable, PA-C 02/21/2020

## 2020-02-21 NOTE — Progress Notes (Signed)
Corey Palmer for Heparin Indication: Afib  Allergies  Allergen Reactions  . Orange Fruit Anaphylaxis, Hives and Other (See Comments)    Per Pt- Blisters around lips and Hives all over, also  . Penicillins Anaphylaxis    Did it involve swelling of the face/tongue/throat, SOB, or low BP? Yes Did it involve sudden or severe rash/hives, skin peeling, or any reaction on the inside of your mouth or nose? Yes Did you need to seek medical attention at a hospital or doctor's office? No When did it last happen?childhood If all above answers are "NO", may proceed with cephalosporin use.  Corey Palmer [Empagliflozin] Itching  . [name] [Insulin Glargine] Nausea And Vomiting    Patient Measurements: Height: 6\' 3"  (190.5 cm) Weight: 95.2 kg (209 lb 14.1 oz) IBW/kg (Calculated) : 84.5 Heparin dosing wt: 101 kg  Vital Signs: Temp: 98 F (36.7 C) (01/24 1130) Temp Source: Axillary (01/24 1130) BP: 102/58 (01/24 1300) Pulse Rate: 76 (01/24 1300)  Labs: Recent Labs    02/19/20 0426 02/19/20 0433 02/20/20 0310 02/20/20 0319 02/20/20 1144 02/20/20 1600 02/20/20 1710 02/20/20 2029 02/21/20 0420 02/21/20 0625 02/21/20 1201  HGB 8.6*   < > 8.3*   < >  --   --  8.5*  --  7.9* 9.2*  --   HCT 28.4*   < > 27.0*   < >  --   --  25.0*  --  25.2* 27.0*  --   PLT 382  --  380  --   --   --   --   --  329  --   --   HEPARINUNFRC  --    < >  --    < >  --   --   --  <0.10* <0.10*  --  <0.10*  CREATININE 1.46*   < > 1.15  --  1.20 1.11  --   --  1.16  --   --    < > = values in this interval not displayed.    Estimated Creatinine Clearance: 90 mL/min (by C-G formula based on SCr of 1.16 mg/dL).   Assessment: 50 yo male s/p impella 5.5 placement. Pt started on heparinized purge and systemic heparin. Pt now s/p CABG 12/15 with Impella remaining in place c/b significant bleeding and hematoma requiring opened chest and reexploration in OR 12/16. Chest  closed on 12/23, re-opened on 12/24. Underwent cardioversion in OR 12/28, s/p sternal wash out/application of wound VAC. Underwent sternal washout, wound VAC change, followed by sternal closure on 1/5. Chest was re-opened on 1/5 given compromised hemodynamics. Underwent impella removal on 1/21.   Pt remains on heparin for AFib. Heparin level remains undetectable. No S/Sx bleeding documented, CBC low stable.  Goal of Therapy:  Heparin level ~0.3  Monitor platelets by anticoagulation protocol: Yes   Plan:  Increase heparin infusion to 1400 units/h Recheck heparin level in 8h  Corey Palmer, PharmD, [place], [facility] Clinical Pharmacist [phone] Please check AMION for all [facility] Pharmacy numbers 02/21/2020

## 2020-02-21 NOTE — Treatment Plan (Signed)
Wanda laying in bed, eyes open. Coarse breath sounds with dependent edema, SR rates in 60s on telemetry.    CVP 15 CO 10.1 CI 4.6 MAP 77 SVR 483 CRRT pulling 50/H   On amio 30, dobutamine 5, vasopressin 0.2.  No adjustments to plan for now.

## 2020-02-21 NOTE — Progress Notes (Addendum)
Patient ID: Corey Palmer, male   DOB: 11-16-1970, 50 y.o.   MRN: [id number]     Advanced Heart Failure Rounding Note  PCP-Cardiologist: No primary care provider on file.   Subjective:    - 12/7 Torsades -ICD shock x1.  - 12/11 Impella 5.5 placed.  - 12/12 Impella repositioned in OR - 12/15 CABG x 4 with LIMA-LAD, SVG-PDA/PLV, SVG-OM - 12/16 DCCV afib.  Back to OR to re-open chest and again to evacuate hematoma.  - 12/19 CVVH initiated  - 12/23 Chest closed - 12/24 Chest reopened - 12/26 RAP back into NSR - 12/27 back in atrial fibrillation, unable to rapid atrial pace out.   - 12/28 To OR, chest partially closed and wound vac placed.  DCCV to NSR.  - 12/31 Antibiotics stopped. Swan removed, HD catheter moved from right femoral to right IJ.  - 1/5 To OR for chest closure. Chest reopened emergently at bedside again to evacuate hematoma  - 1/10 To OR: omental flap closure of chest with wound vac, tracheostomy, G-tube, J-tube.    - 1/12 Plastics and EP consulted.  - 1/13 Palliative Care consulted.  - 1/15 Went into VT, Shock 200 J and amiodarone bolus 150.   - 1/16: Milrinone stopped.    - 1/17: Partial closure of chest in OR - 1/18 Switched to dobutamine 4 mcg - 1/21 Impella Extracted.  Developed hyperkalemia when CVVH held with wide QRS rhythm and required transient external pacing.    Co-ox 60% on dobutamine 5, vasopressin 0.04. Wt - 9lbs since yesterday, CVP 15, CRRT pulling even.  Off levophed. Gets agitated and wakes to tactile stimuli (AM bath).   FloTrac CVP 15 CO 9.7 CI 4.4 MAP 74 SVR 470   Objective:   Weight Range: 99.1 kg Body mass index is 27.31 kg/m.   Vital Signs:   Temp:  [96 F (35.6 C)-98.8 F (37.1 C)] 98.8 F (37.1 C) (01/24 0400) Pulse Rate:  [64-99] 70 (01/24 0500) Resp:  [8-29] 11 (01/24 0500) BP: (93-122)/(50-83) 109/67 (01/24 0500) SpO2:  [85 %-100 %] 99 % (01/24 0500) Arterial Line BP: (102-145)/(38-65) 127/55 (01/24 0500) FiO2 (%):  [50  %] 50 % (01/24 0400) Last BM Date: 02/20/20  Weight change: Filed Weights   02/05/2020 0500 02/19/20 0500 02/20/20 0500  Weight: 93.1 kg 96.3 kg 99.1 kg    Intake/Output:   Intake/Output Summary (Last 24 hours) at 02/21/2020 0532 Last data filed at 02/21/2020 0500 Gross per 24 hour  Intake 4215.94 ml  Output 4778 ml  Net -562.06 ml      Physical Exam   CVP 15 General:  Chronically ill HEENT: trach Neck: supple.  Cor: Regular rate & rhythm. No rubs, gallops or murmurs. Lungs: Diminished bases Abdomen: Soft, nontender, + distended. Good bowel sounds. Extremities: no cyanosis, clubbing, rash.  Dependent edema.   Neuro: Sedated.     Telemetry   SR rates 60-70s.  Personally reviewed.      Labs    CBC Recent Labs    02/20/20 0310 02/20/20 0319 02/20/20 1710 02/21/20 0420  WBC 22.5*  --   --  16.2*  NEUTROABS 20.1*  --   --  PENDING  HGB 8.3*   < > 8.5* 7.9*  HCT 27.0*   < > 25.0* 25.2*  MCV 87.9  --   --  88.1  PLT 380  --   --  329   < > = values in this interval not displayed.   Basic Metabolic  Panel Recent Labs    02/20/20 1600 02/20/20 1710 02/21/20 0420  NA 132* 138 132*  K 4.2 3.7 4.3  CL 100  --  101  CO2 23  --  22  GLUCOSE 204*  --  169*  BUN 36*  --  34*  CREATININE 1.11  --  1.16  CALCIUM 8.2*  --  8.3*  MG 2.5*  --  2.5*  PHOS 2.6  2.6  --  2.7   Liver Function Tests Recent Labs    02/20/20 1600 02/21/20 0420  ALBUMIN 1.7* 1.7*   No results for input(s): LIPASE, AMYLASE in the last 72 hours. Cardiac Enzymes No results for input(s): CKTOTAL, CKMB, CKMBINDEX, TROPONINI in the last 72 hours.  BNP: BNP (last 3 results) Recent Labs    01/07/2020 1619  BNP 577.5*    ProBNP (last 3 results) No results for input(s): PROBNP in the last 8760 hours.   D-Dimer No results for input(s): DDIMER in the last 72 hours. Hemoglobin A1C No results for input(s): HGBA1C in the last 72 hours. Fasting Lipid Panel No results for input(s): CHOL,  HDL, LDLCALC, TRIG, CHOLHDL, LDLDIRECT in the last 72 hours. Thyroid Function Tests No results for input(s): TSH, T4TOTAL, T3FREE, THYROIDAB in the last 72 hours.  Invalid input(s): FREET3  Other results:   Imaging    DG CHEST PORT 1 VIEW  Result Date: 02/20/2020 CLINICAL DATA:  Postop follow-up EXAM: PORTABLE CHEST 1 VIEW COMPARISON:  Yesterday FINDINGS: Cardiomegaly that is unchanged. Single chamber defibrillator lead into the right ventricle. Right PICC with tip at the SVC. Tracheostomy tube in good position. Unchanged extrapleural opacity at the right apex which was pulmonary and parenchymal by CT. Hazy opacity in the perihilar lungs likely from atelectasis. No visible pneumothorax. IMPRESSION: Unchanged hazy opacity of the bilateral chest likely from atelectasis and pleural fluid based on most recent CT. Electronically Signed   By: Monte Fantasia M.D.   On: 02/20/2020 07:09     Medications:     Scheduled Medications: . sodium chloride   Intravenous Once  . vitamin C  500 mg Per Tube TID  . aspirin  324 mg Per Tube Daily  . atorvastatin  80 mg Per Tube Daily  . busPIRone  10 mg Per Tube TID  . chlorhexidine gluconate (MEDLINE KIT)  15 mL Mouth Rinse BID  . Chlorhexidine Gluconate Cloth  6 each Topical Daily  . darbepoetin (ARANESP) injection - NON-DIALYSIS  100 mcg Subcutaneous Q Sat-1800  . docusate  100 mg Per Tube BID  . feeding supplement (PROSource TF)  45 mL Per Tube BID  . feeding supplement (VITAL HIGH PROTEIN)  1,000 mL Per Tube Q24H  . fiber  1 packet Per Tube BID  . FLUoxetine  40 mg Per Tube BID  . hydrocortisone sod succinate (SOLU-CORTEF) inj  50 mg Intravenous Q6H  . HYDROmorphone  4 mg Per Tube Q6H  . LORazepam  1 mg Intravenous Q4H  . mouth rinse  15 mL Mouth Rinse 10 times per day  . melatonin  5 mg Per Tube Daily  . mexiletine  150 mg Per Tube Q8H  . midodrine  20 mg Per Tube TID WC  . multivitamin  1 tablet Per Tube QHS  . nutrition supplement  (JUVEN)  1 packet Per Tube BID BM  . pantoprazole (PROTONIX) IV  40 mg Intravenous Q12H  . polyethylene glycol  17 g Per Tube Daily  . sodium chloride flush  10-40 mL  Intracatheter Q12H  . thiamine  100 mg Per Tube Daily  . valproic acid  250 mg Per Tube BID    Infusions: .  prismasol BGK 4/2.5 500 mL/hr at 02/20/20 2235  .  prismasol BGK 4/2.5 200 mL/hr at 02/19/20 2035  . sodium chloride 1,000 mL (02/05/2020 1829)  . sodium chloride    . amiodarone 30 mg/hr (02/21/20 0500)  . anidulafungin Stopped (02/20/20 2214)  . dexmedetomidine (PRECEDEX) IV infusion 1.2 mcg/kg/hr (02/21/20 0500)  . dextrose Stopped (02/20/20 1504)  . DOBUTamine 5 mcg/kg/min (02/21/20 0500)  . electrolyte-A Stopped (02/03/20 3845)  . epinephrine Stopped (02/19/20 1600)  . heparin 1,100 Units/hr (02/21/20 0500)  . HYDROmorphone 2 mg/hr (02/21/20 0500)  . insulin 1.6 mL/hr at 02/21/20 0500  . lactated ringers 10 mL/hr at 02/21/20 0500  . meropenem (MERREM) IV 1 g (02/21/20 0500)  . norepinephrine (LEVOPHED) Adult infusion Stopped (02/21/20 0157)  . prismasol BGK 4/2.5 1,500 mL/hr at 02/21/20 0459  . vancomycin Stopped (02/20/20 1620)  . vasopressin 0.04 Units/min (02/21/20 0500)    PRN Medications: sodium chloride, Place/Maintain arterial line **AND** sodium chloride, artificial tears, bisacodyl **OR** bisacodyl, dextrose, docusate, heparin, heparin, HYDROmorphone, lip balm, metoprolol tartrate, midazolam, polyethylene glycol, sodium chloride flush     Assessment/Plan   1. Cardiogenic shock/acute on chronic systolic CHF: Initially nonischemic cardiomyopathy.  Possible familial cardiomyopathy as both parents had cardiomyopathy and died at around 17. However, [employer] gene testing did not show any common mutation for cardiomyopathy.  However, this admission noted to have severe 3 vessel disease so suspect component of ischemic cardiomyopathy.  St Jude ICD.  Echo in 8/20 with EF 15% and mildly decreased RV  function. Echo this admission with EF < 20%, moderate LV dilation, RV mildly reduced, severe LAE, no significant MR. Low output HF with markedly low EF.  He has a long history of cardiomyopathy (20 yrs), tends to minimize symptoms.  Cardiorenal syndrome with creatinine up to 2.2,  stabilized on milrinone and Impella 5.5. SCr 1.44 day of CABG. CABG 12/15.  Post-op shock, back to OR 12/16 to open chest (chest wall compartment syndrome) and again to evacuate hematoma.  TEE post-op with severe RV dysfunction.  CVVHD initiated 12/19 for volume removal in setting of rising Scr/decreased UOP. Suspect ATN. S/p chest closure 12/23. Hemodynamics much worse on 12/24 with rising pressor demands. Co-ox 36%.  Bedside echo severe biventricular dysfunction with marked septal bounce. Chest re-opened 12/24.  Was atrial paced back to NSR on 12/26 with improvement in hemodynamics but back in atrial fibrillation on 12/27 and unable to pace out, DCCV to NSR on 12/28. Went back to OR 1/5 for chest closure but several hours later required emergent re-opening of chest at bedside to evacuate hematoma. To OR again 1/10 with closure of chest with omental flap with G & J tube.  1/17 partial closure. 1/21 Impella removed.  Off CVVH post-removal, he developed hyperkalemia and wide QRS rhythm requiring transient external pacing, also with suspected septic shock (low SVR, fever, rising WBCs).   - On dobutamine 5, vasopressin 0.04 with co-ox 60%. Will repeat co-ox. CO 9.7, SVR 470, CVP 15, weight - 9lbs since yesterday.   - Would aim for UF -50 cc/hr net via CVVH now that off NE and CVP up 15. - Not candidate to replace Impella (at this point, not candidate for LVAD or transplant). He can stay on dobutamine long-term.  2. Atrial fibrillation: H/o PAF.  He was on dofetilide in the remote  past but this was stopped due to noncompliance.  He had an upper GI bleed from antral ulcers in 3/12. He was seen by GI and was deemed safe to restart  anticoagulation as long as he remains on a PPI.  No apparent recurrence of AF until just prior to this admission, was cardioverted back to NSR in ER but went back into atrial fibrillation.  Post-op afib, DCCV to NSR/BiV pacing am 12/16 -> back to AF. Rapid atrial pacing back to SR. Maintaining NSR.  - Continue amio 30 mg per hour and on systemic heparin gtt.   3. CAD:   Cath this admission with severe 3 vessel disease. CABG x 4 on 12/15.  - Continue atorvastatin, ASA.  4. Acute on chronic hypoxemic respiratory failure: Vent per CCM.  Has tracheostomy.  - CCM following.  5. AKI on CKD stage 3: Likely ATN/cardiorenal. Anuric. On CVVH.    - Aim for net negative 50 cc/hr.  - Suspect he is going to be HD dependent.  6. Diabetes: Insulin.  7. Hyponatremia: Resolved with CVVH.  8. Torsades/ICD shock: 12/7/212 in hospital event, in setting of severe hypokalemia and hypomagnesemia.   9. Gout: h/o gout. Complained of rt knee pain c/w previous flares - treated w/ prednisone burst (completed).  10: ID: Septic shock 1/21 with fever, low SVR, and elevated WBCs. Cultures NGTD x 2 days.  IJ catheter removed.  - On vancomycin/meropenem, anidulafungin.  11. FEN: TFs restarted yesterday.  12. Anemia: Hgb 7.9 today, trending down. Repeat another H&H this afternoon.  Transfuse < 7.5.  13. Stage II Pressure Ulcer- sacrum: Continue to reposition every 2 hours R/L  14. Pleural Effusion: Loculated rt apical pleural effusion noted on imaging. Continue UF through CVVH.  PCCM following  15. VT: Amiodarone gtt + mexiletine.    Per dayshift RN on 1/23, family would like to get all disciplines together for a meeting to discuss patient's status.    Carlene Coria 02/21/2020 5:32 AM  Patient seen with NP, agree with the above note.   Stable day.  He is now on dobutamine 5, vasopressin 0.04, off NE.  Co-ox 60%. CVP 15. I/Os even yesterday with CVVH.   Afebrile on vancomycin/meropenem/anidulafungin.   Heparin gtt  ongoing.   General: NAD Neck: tracheostomy, CVP 12, no thyromegaly or thyroid nodule.  Lungs: Decreased BS at bases.  CV:  Chest wall partially open.  Heart regular S1/S2, no S3/S4, no murmur.  No peripheral edema.    Abdomen: Soft, nontender, no hepatosplenomegaly, no distention.  Skin: Intact without lesions or rashes.  Neurologic: Will awaken and follow commands per nursing.  Extremities: No clubbing or cyanosis.  HEENT: Normal.   Mixed septic/cardiogenic shock.  Improving.  Continue dobutamine 5, will wean vasopressin to 0.03 and can drop to 0.02 this afternoon if remains stable.  Continue midodrine.   Continue CVVH, goal UF net -50 cc/hr today.    Continue antibiotics/antifungal, he is afebrile with WBCs trending down.   Transfuse hgb < 7.5.   Passive range of motion by nursing, PT.   CRITICAL CARE Performed by: Loralie Champagne  Total critical care time: 35 minutes  Critical care time was exclusive of separately billable procedures and treating other patients.  Critical care was necessary to treat or prevent imminent or life-threatening deterioration.  Critical care was time spent personally by me on the following activities: development of treatment plan with patient and/or surrogate as well as nursing, discussions with consultants, evaluation of patient's response  to treatment, examination of patient, obtaining history from patient or surrogate, ordering and performing treatments and interventions, ordering and review of laboratory studies, ordering and review of radiographic studies, pulse oximetry and re-evaluation of patient's condition.  Loralie Champagne 02/21/2020 7:57 AM

## 2020-02-21 NOTE — Progress Notes (Signed)
3 Days Post-Op Procedure(s) (LRB): REMOVAL OF IMPELLA LEFT VENTRICULAR ASSIST DEVICE (N/A) TRANSESOPHAGEAL ECHOCARDIOGRAM (TEE) (N/A) Subjective: sedated  Objective: Vital signs in last 24 hours: Temp:  [96 F (35.6 C)-98.8 F (37.1 C)] 97.2 F (36.2 C) (01/24 0800) Pulse Rate:  [64-99] 68 (01/24 0800) Cardiac Rhythm: Normal sinus rhythm;Heart block (01/24 0800) Resp:  [8-27] 24 (01/24 0800) BP: (93-122)/(58-83) 106/68 (01/24 0800) SpO2:  [85 %-100 %] 100 % (01/24 0800) Arterial Line BP: (102-145)/(38-65) 128/53 (01/24 0800) FiO2 (%):  [50 %] 50 % (01/24 0800) Weight:  [95.2 kg] 95.2 kg (01/24 0600)  Hemodynamic parameters for last 24 hours: CVP:  [7 mmHg-22 mmHg] 14 mmHg  Intake/Output from previous day: 01/23 0701 - 01/24 0700 In: 4272.8 [I.V.:2482.9; NG/GT:700; IV Piggyback:629.9] Out: 4601 [Drains:10; Stool:650; Chest Tube:10] Intake/Output this shift: Total I/O In: -  Out: 138 [Other:138]  General appearance: sedated Neurologic: sedated Heart: regular rate and rhythm Wound: VAC in place Minimal bloody drainage from inferior aspect abdominal wound, no erythema  Lab Results: Recent Labs    02/20/20 0310 02/20/20 0319 02/21/20 0420 02/21/20 0625  WBC 22.5*  --  16.2*  --   HGB 8.3*   < > 7.9* 9.2*  HCT 27.0*   < > 25.2* 27.0*  PLT 380  --  329  --    < > = values in this interval not displayed.   BMET:  Recent Labs    02/20/20 1600 02/20/20 1710 02/21/20 0420 02/21/20 0625  NA 132*   < > 132* 137  K 4.2   < > 4.3 4.3  CL 100  --  101  --   CO2 23  --  22  --   GLUCOSE 204*  --  169*  --   BUN 36*  --  34*  --   CREATININE 1.11  --  1.16  --   CALCIUM 8.2*  --  8.3*  --    < > = values in this interval not displayed.    PT/INR: No results for input(s): LABPROT, INR in the last 72 hours. ABG    Component Value Date/Time   PHART 7.401 02/21/2020 0625   HCO3 24.3 02/21/2020 0625   TCO2 26 02/21/2020 0625   ACIDBASEDEF 1.0 02/21/2020 0625    O2SAT 98.0 02/21/2020 0625   CBG (last 3)  Recent Labs    02/21/20 0305 02/21/20 0414 02/21/20 0622  GLUCAP 160* 167* 161*    Assessment/Plan: S/P Procedure(s) (LRB): REMOVAL OF IMPELLA LEFT VENTRICULAR ASSIST DEVICE (N/A) TRANSESOPHAGEAL ECHOCARDIOGRAM (TEE) (N/A) -Remains critically ill with multiple organ system failure CV- hemodynamics improved, off Levophed  Weaning vasopressin, continue dobutamine  On heparin drip RESP- VDRF- trach, vent per CCM RENAL- ARF- on CVVHD, lytes OK ENDO-CBG mildly elevated ID- WBC trending down  On Vanco and meropenem Nutrition- Tf resumed  LOS: 53 days    Melrose Nakayama 02/21/2020

## 2020-02-21 NOTE — Progress Notes (Signed)
OT Cancellation Note  Patient Details Name: Corey Palmer MRN: [id number] DOB: 1970-04-19   Cancelled Treatment:    Reason Eval/Treat Not Completed: Other (comment) (PT just finishing session. Reports limited participation due to pain, arousal, and lines. Would benefit to hold OT eval to tomorrow to allow pt to rest and alternate therapy days until pt is more particiaptory. Will return as schedule allows. Thank you.)  [place], OTR/L Acute Rehab Pager: [phone] Office: [phone] 02/21/2020, 9:35 AM

## 2020-02-21 NOTE — Progress Notes (Signed)
ANTICOAGULATION CONSULT NOTE - Follow Up Consult  Pharmacy Consult for heparin Indication: atrial fibrillation  Labs: Recent Labs    02/19/20 0426 02/19/20 0433 02/20/20 0310 02/20/20 0319 02/20/20 1600 02/20/20 1710 02/21/20 0420 02/21/20 0625 02/21/20 1201 02/21/20 1502 02/21/20 2226  HGB 8.6*   < > 8.3*   < >  --    < > 7.9* 9.2*  --  8.0*  --   HCT 28.4*   < > 27.0*   < >  --    < > 25.2* 27.0*  --  26.8*  --   PLT 382  --  380  --   --   --  329  --   --   --   --   HEPARINUNFRC  --    < >  --    < >  --    < > <0.10*  --  <0.10*  --  0.16*  CREATININE 1.46*   < > 1.15   < > 1.11  --  1.16  --   --  1.14  --    < > = values in this interval not displayed.    Assessment: 50yo male remains subtherapeutic on heparin after conservative rate changes d/t bleeding risk; no gtt issues or signs of bleeding per RN.  Goal of Therapy:  Heparin level ~0.3 units/ml   Plan:  Will increase heparin gtt conservatively by 100 units/hr to 1500 units/hr and check level with am labs.    Wynona Neat, PharmD, BCPS  02/21/2020,11:26 PM   Addendum: Heparin level now 0.19, remains below goal.  Will increase heparin gtt conservatively to 1500 units/hr and check level in 6hr.  VB 02/22/2020 5:37 AM

## 2020-02-21 NOTE — Progress Notes (Signed)
[facility] NEPHROLOGY PROGRESS NOTE  Assessment/ Plan: Pt is a 50 y.o. yo male  with familial cardiomyopathy EF20% s/p redo CABG 11/04/10 complicated by persistent shock, multiple attempts at sternal closure with formation of chest hematoma and tamponade, Afib with RVR, and AKI requiring CRRT.    # Anuric AKI/CKD stage III- presumably due to ischemic ATN in setting of cardiogenic shock s/p CABG.  CRRT started 01/16/20.  All fluids 4K/2.5Ca.  RIJ on 01/28/20. Continue CRRT, no prescription change, UF is about net even - cont this rate.  Hyperkalemia improved.  The line was changed to right femoral catheter.  Cont CRRT for now unless goals of care transition.   # CAD s/p CABG complicated by cardiogenic shock and post-op hematoma. secondary closure on 12/23 then had to be re opened. Went to the OR for washout on 12/28, bedside washout 02/01/19.  OR 01/31/2020 for closure, then had emergent reopening at the bedside 02/19/2020 d/t anterior chest hematoma again.   # Cardiogenic shock- underlying familial cardiomyopathy s/p redo CABG, EF ~20%. Remains on pressors, inotrope. Midodrine 20 tid.  The Impella was removed on 1/21.  # ABLA- s/p postoperative bleeding into chest cavity s/p re-exploration for tamponade/hematoma. Received multiple blood products  # Atrial fibrillation - s/p DCCV on 12/16. On amiodarone.   # VDRF- per CHF/PCCMsvc. Trach/peg 1/10  #-Leukocytosis/  Fever on 02/13/2020: per primary, started on broad-spectrum antibiotics.  Appears cultures are NGTD.  #Hypophosphatemia: Repleted IV phosphate previously, ok this AM.  Subjective: Seen and examined in ICU.  Running CRRT.  On vent.  Remains critically ill without any new event.  I/Os 4.2 / 4.5.  Objective Vital signs in last 24 hours: Vitals:   02/21/20 0615 02/21/20 0630 02/21/20 0645 02/21/20 0700  BP:    108/69  Pulse: 70 67 66 67  Resp: 12 (!) 21 (!) 24 19  Temp:      TempSrc:      SpO2: 100% 96% 99% 97%   Weight:      Height:       Weight change: -3.9 kg  Intake/Output Summary (Last 24 hours) at 02/21/2020 0704 Last data filed at 02/21/2020 0700 Gross per 24 hour  Intake 4272.82 ml  Output 4460 ml  Net -187.18 ml       Labs: Basic Metabolic Panel: Recent Labs  Lab 02/20/20 0310 02/20/20 0319 02/20/20 1144 02/20/20 1600 02/20/20 1710 02/21/20 0420 02/21/20 0625  NA 131*   < > 133* 132* 138 132* 137  K 4.8   < > 4.6 4.2 3.7 4.3 4.3  CL 99  --  100 100  --  101  --   CO2 21*  --  23 23  --  22  --   GLUCOSE 148*  --  191* 204*  --  169*  --   BUN 37*  --  36* 36*  --  34*  --   CREATININE 1.15  --  1.20 1.11  --  1.16  --   CALCIUM 8.3*  --  8.3* 8.2*  --  8.3*  --   PHOS 3.6  --   --  2.6  2.6  --  2.7  --    < > = values in this interval not displayed.   Liver Function Tests: Recent Labs  Lab 02/16/20 0310 02/16/20 1742 02/20/20 0310 02/20/20 1600 02/21/20 0420  AST 57*  --   --   --   --   ALT 51*  --   --   --   --  ALKPHOS 261*  --   --   --   --   BILITOT 1.2  --   --   --   --   PROT 5.8*  --   --   --   --   ALBUMIN 1.8*   < > 1.6* 1.7* 1.7*   < > = values in this interval not displayed.   No results for input(s): LIPASE, AMYLASE in the last 168 hours. No results for input(s): AMMONIA in the last 168 hours. CBC: Recent Labs  Lab 02/10/2020 0305 02/10/2020 0319 02/14/2020 1510 02/25/2020 1826 02/19/20 0426 02/19/20 0433 02/20/20 0310 02/20/20 0319 02/20/20 1710 02/21/20 0420 02/21/20 0625  WBC 22.0*  --  31.7*  --  24.6*  --  22.5*  --   --  16.2*  --   NEUTROABS 17.1*  --   --   --  21.6*  --  20.1*  --   --  14.5*  --   HGB 7.6*   < > 7.5*   < > 8.6*   < > 8.3*   < > 8.5* 7.9* 9.2*  HCT 25.7*   < > 25.6*   < > 28.4*   < > 27.0*   < > 25.0* 25.2* 27.0*  MCV 91.5  --  92.8  --  88.5  --  87.9  --   --  88.1  --   PLT 389  --  502*  --  382  --  380  --   --  329  --    < > = values in this interval not displayed.   Cardiac Enzymes: No  results for input(s): CKTOTAL, CKMB, CKMBINDEX, TROPONINI in the last 168 hours. CBG: Recent Labs  Lab 02/21/20 0114 02/21/20 0156 02/21/20 0305 02/21/20 0414 02/21/20 0622  GLUCAP 173* 170* 160* 167* 161*    Iron Studies: No results for input(s): IRON, TIBC, TRANSFERRIN, FERRITIN in the last 72 hours. Studies/Results: DG CHEST PORT 1 VIEW  Result Date: 02/20/2020 CLINICAL DATA:  Postop follow-up EXAM: PORTABLE CHEST 1 VIEW COMPARISON:  Yesterday FINDINGS: Cardiomegaly that is unchanged. Single chamber defibrillator lead into the right ventricle. Right PICC with tip at the SVC. Tracheostomy tube in good position. Unchanged extrapleural opacity at the right apex which was pulmonary and parenchymal by CT. Hazy opacity in the perihilar lungs likely from atelectasis. No visible pneumothorax. IMPRESSION: Unchanged hazy opacity of the bilateral chest likely from atelectasis and pleural fluid based on most recent CT. Electronically Signed   By: Jonathon  Watts M.D.   On: 02/20/2020 07:09    Medications: Infusions: .  prismasol BGK 4/2.5 500 mL/hr at 02/20/20 2235  .  prismasol BGK 4/2.5 200 mL/hr at 02/19/20 2035  . sodium chloride 1,000 mL (02/01/2020 1829)  . sodium chloride    . amiodarone 30 mg/hr (02/21/20 0700)  . anidulafungin Stopped (02/20/20 2214)  . dexmedetomidine (PRECEDEX) IV infusion 1.2 mcg/kg/hr (02/21/20 0701)  . dextrose Stopped (02/20/20 1504)  . DOBUTamine 5 mcg/kg/min (02/21/20 0700)  . electrolyte-A Stopped (02/03/20 0702)  . epinephrine Stopped (02/19/20 1600)  . heparin 1,200 Units/hr (02/21/20 0700)  . HYDROmorphone 2 mg/hr (02/21/20 0700)  . insulin 1.4 mL/hr at 02/21/20 0700  . lactated ringers 10 mL/hr at 02/21/20 0700  . meropenem (MERREM) IV Stopped (02/21/20 0530)  . norepinephrine (LEVOPHED) Adult infusion Stopped (02/21/20 0157)  . prismasol BGK 4/2.5 1,500 mL/hr at 02/21/20 0459  . vancomycin Stopped (02/20/20 1620)  . vasopressin 0.04 Units/min  (  02/21/20 0700)    Scheduled Medications: . sodium chloride   Intravenous Once  . vitamin C  500 mg Per Tube TID  . aspirin  324 mg Per Tube Daily  . atorvastatin  80 mg Per Tube Daily  . busPIRone  10 mg Per Tube TID  . chlorhexidine gluconate (MEDLINE KIT)  15 mL Mouth Rinse BID  . Chlorhexidine Gluconate Cloth  6 each Topical Daily  . darbepoetin (ARANESP) injection - NON-DIALYSIS  100 mcg Subcutaneous Q Sat-1800  . docusate  100 mg Per Tube BID  . feeding supplement (PROSource TF)  45 mL Per Tube BID  . feeding supplement (VITAL HIGH PROTEIN)  1,000 mL Per Tube Q24H  . fiber  1 packet Per Tube BID  . FLUoxetine  40 mg Per Tube BID  . hydrocortisone sod succinate (SOLU-CORTEF) inj  50 mg Intravenous Q6H  . HYDROmorphone  4 mg Per Tube Q6H  . LORazepam  1 mg Intravenous Q4H  . mouth rinse  15 mL Mouth Rinse 10 times per day  . melatonin  5 mg Per Tube Daily  . mexiletine  150 mg Per Tube Q8H  . midodrine  20 mg Per Tube TID WC  . multivitamin  1 tablet Per Tube QHS  . nutrition supplement (JUVEN)  1 packet Per Tube BID BM  . pantoprazole (PROTONIX) IV  40 mg Intravenous Q12H  . polyethylene glycol  17 g Per Tube Daily  . sodium chloride flush  10-40 mL Intracatheter Q12H  . thiamine  100 mg Per Tube Daily  . valproic acid  250 mg Per Tube BID    have reviewed scheduled and prn medications.  Physical Exam: General: Critically ill looking male sedated and intubated.  Heart:RRR, s1s2 nl Lungs: Coarse breath sound bilateral Abdomen:soft, Non-tender, non-distended Extremities:No edema Dialysis Access:  right femoral catheter site clean.   A  02/21/2020,7:04 AM  LOS: 53 days    

## 2020-02-22 ENCOUNTER — Inpatient Hospital Stay (HOSPITAL_COMMUNITY): Payer: BC Managed Care – PPO

## 2020-02-22 DIAGNOSIS — J9621 Acute and chronic respiratory failure with hypoxia: Secondary | ICD-10-CM | POA: Diagnosis not present

## 2020-02-22 DIAGNOSIS — I509 Heart failure, unspecified: Secondary | ICD-10-CM | POA: Diagnosis not present

## 2020-02-22 DIAGNOSIS — I5023 Acute on chronic systolic (congestive) heart failure: Secondary | ICD-10-CM | POA: Diagnosis not present

## 2020-02-22 DIAGNOSIS — R570 Cardiogenic shock: Secondary | ICD-10-CM | POA: Diagnosis not present

## 2020-02-22 DIAGNOSIS — I4891 Unspecified atrial fibrillation: Secondary | ICD-10-CM | POA: Diagnosis not present

## 2020-02-22 DIAGNOSIS — Z515 Encounter for palliative care: Secondary | ICD-10-CM | POA: Diagnosis not present

## 2020-02-22 DIAGNOSIS — J9601 Acute respiratory failure with hypoxia: Secondary | ICD-10-CM | POA: Diagnosis not present

## 2020-02-22 DIAGNOSIS — N179 Acute kidney failure, unspecified: Secondary | ICD-10-CM | POA: Diagnosis not present

## 2020-02-22 LAB — BPAM RBC
Blood Product Expiration Date: 202202082359
Blood Product Expiration Date: 202202082359
Blood Product Expiration Date: 202202102359
Blood Product Expiration Date: 202202102359
ISSUE DATE / TIME: 202201211711
ISSUE DATE / TIME: 202201212020
Unit Type and Rh: 6200
Unit Type and Rh: 6200
Unit Type and Rh: 6200
Unit Type and Rh: 6200

## 2020-02-22 LAB — TYPE AND SCREEN
ABO/RH(D): A POS
ABO/RH(D): A POS
Antibody Screen: NEGATIVE
Antibody Screen: NEGATIVE
Unit division: 0
Unit division: 0
Unit division: 0
Unit division: 0

## 2020-02-22 LAB — CBC WITH DIFFERENTIAL/PLATELET
Abs Immature Granulocytes: 0.37 10*3/uL — ABNORMAL HIGH (ref 0.00–0.07)
Basophils Absolute: 0 10*3/uL (ref 0.0–0.1)
Basophils Relative: 0 %
Eosinophils Absolute: 0 10*3/uL (ref 0.0–0.5)
Eosinophils Relative: 0 %
HCT: 25.9 % — ABNORMAL LOW (ref 39.0–52.0)
Hemoglobin: 8 g/dL — ABNORMAL LOW (ref 13.0–17.0)
Immature Granulocytes: 2 %
Lymphocytes Relative: 4 %
Lymphs Abs: 0.6 10*3/uL — ABNORMAL LOW (ref 0.7–4.0)
MCH: 27.9 pg (ref 26.0–34.0)
MCHC: 30.9 g/dL (ref 30.0–36.0)
MCV: 90.2 fL (ref 80.0–100.0)
Monocytes Absolute: 0.9 10*3/uL (ref 0.1–1.0)
Monocytes Relative: 5 %
Neutro Abs: 14.4 10*3/uL — ABNORMAL HIGH (ref 1.7–7.7)
Neutrophils Relative %: 89 %
Platelets: 315 10*3/uL (ref 150–400)
RBC: 2.87 MIL/uL — ABNORMAL LOW (ref 4.22–5.81)
RDW: 22.2 % — ABNORMAL HIGH (ref 11.5–15.5)
WBC: 16.3 10*3/uL — ABNORMAL HIGH (ref 4.0–10.5)
nRBC: 0.2 % (ref 0.0–0.2)

## 2020-02-22 LAB — RENAL FUNCTION PANEL
Albumin: 1.8 g/dL — ABNORMAL LOW (ref 3.5–5.0)
Albumin: 1.9 g/dL — ABNORMAL LOW (ref 3.5–5.0)
Anion gap: 9 (ref 5–15)
Anion gap: 10 (ref 5–15)
BUN: 43 mg/dL — ABNORMAL HIGH (ref 6–20)
BUN: 46 mg/dL — ABNORMAL HIGH (ref 6–20)
CO2: 23 mmol/L (ref 22–32)
CO2: 22 mmol/L (ref 22–32)
Calcium: 7.9 mg/dL — ABNORMAL LOW (ref 8.9–10.3)
Calcium: 8 mg/dL — ABNORMAL LOW (ref 8.9–10.3)
Chloride: 97 mmol/L — ABNORMAL LOW (ref 98–111)
Chloride: 104 mmol/L (ref 98–111)
Creatinine, Ser: 1.16 mg/dL (ref 0.61–1.24)
Creatinine, Ser: 1.17 mg/dL (ref 0.61–1.24)
GFR, Estimated: >60 mL/min (ref >60)
GFR, Estimated: >60 mL/min (ref >60)
Glucose, Bld: 215 mg/dL — ABNORMAL HIGH (ref 70–99)
Glucose, Bld: 202 mg/dL — ABNORMAL HIGH (ref 70–99)
Phosphorus: 2.3 mg/dL — ABNORMAL LOW (ref 2.5–4.6)
Phosphorus: 2.1 mg/dL — ABNORMAL LOW (ref 2.5–4.6)
Potassium: 4.1 mmol/L (ref 3.5–5.1)
Potassium: 3.4 mmol/L — ABNORMAL LOW (ref 3.5–5.1)
Sodium: 129 mmol/L — ABNORMAL LOW (ref 135–145)
Sodium: 136 mmol/L (ref 135–145)

## 2020-02-22 LAB — GLUCOSE, CAPILLARY
Glucose-Capillary: 181 mg/dL — ABNORMAL HIGH (ref 70–99)
Glucose-Capillary: 192 mg/dL — ABNORMAL HIGH (ref 70–99)
Glucose-Capillary: 192 mg/dL — ABNORMAL HIGH (ref 70–99)
Glucose-Capillary: 194 mg/dL — ABNORMAL HIGH (ref 70–99)
Glucose-Capillary: 209 mg/dL — ABNORMAL HIGH (ref 70–99)
Glucose-Capillary: 217 mg/dL — ABNORMAL HIGH (ref 70–99)
Glucose-Capillary: 225 mg/dL — ABNORMAL HIGH (ref 70–99)
Glucose-Capillary: 278 mg/dL — ABNORMAL HIGH (ref 70–99)

## 2020-02-22 LAB — VANCOMYCIN, TROUGH: Vancomycin Tr: 18 ug/mL (ref 15–20)

## 2020-02-22 LAB — HEPARIN LEVEL (UNFRACTIONATED)
Heparin Unfractionated: 0.19 IU/mL — ABNORMAL LOW (ref 0.30–0.70)
Heparin Unfractionated: 0.28 IU/mL — ABNORMAL LOW (ref 0.30–0.70)
Heparin Unfractionated: 0.25 IU/mL — ABNORMAL LOW (ref 0.30–0.70)

## 2020-02-22 LAB — COOXEMETRY PANEL
Carboxyhemoglobin: 2 % — ABNORMAL HIGH (ref 0.5–1.5)
Methemoglobin: 1 % (ref 0.0–1.5)
O2 Saturation: 69 %
Total hemoglobin: 7.7 g/dL — ABNORMAL LOW (ref 12.0–16.0)

## 2020-02-22 LAB — MAGNESIUM: Magnesium: 2.4 mg/dL (ref 1.7–2.4)

## 2020-02-22 MED ORDER — INSULIN DETEMIR 100 UNIT/ML ~~LOC~~ SOLN
12.0000 [IU] | Freq: Two times a day (BID) | SUBCUTANEOUS | Status: DC
  Administered 2020-02-22 – 2020-03-06 (×26): 12 [IU] via SUBCUTANEOUS
  Filled 2020-02-22 (×29): qty 0.12

## 2020-02-22 MED ORDER — LOPERAMIDE HCL 1 MG/7.5ML PO SUSP
2.0000 mg | Freq: Four times a day (QID) | ORAL | Status: DC | PRN
  Administered 2020-02-22 (×2): 2 mg
  Filled 2020-02-22 (×4): qty 15

## 2020-02-22 MED ORDER — HYDROCORTISONE NA SUCCINATE PF 100 MG IJ SOLR
50.0000 mg | Freq: Three times a day (TID) | INTRAMUSCULAR | Status: DC
  Administered 2020-02-22 – 2020-02-23 (×3): 50 mg via INTRAVENOUS
  Filled 2020-02-22 (×2): qty 2

## 2020-02-22 MED ORDER — INSULIN ASPART 100 UNIT/ML ~~LOC~~ SOLN
2.0000 [IU] | SUBCUTANEOUS | Status: DC
  Administered 2020-02-22 – 2020-02-23 (×6): 2 [IU] via SUBCUTANEOUS

## 2020-02-22 MED ORDER — INSULIN DETEMIR 100 UNIT/ML ~~LOC~~ SOLN
12.0000 [IU] | Freq: Two times a day (BID) | SUBCUTANEOUS | Status: DC
  Filled 2020-02-22 (×2): qty 0.12

## 2020-02-22 MED ORDER — INSULIN ASPART 100 UNIT/ML ~~LOC~~ SOLN
0.0000 [IU] | SUBCUTANEOUS | Status: DC
  Administered 2020-02-22 (×2): 3 [IU] via SUBCUTANEOUS
  Administered 2020-02-22 – 2020-02-23 (×7): 2 [IU] via SUBCUTANEOUS
  Administered 2020-02-24 (×2): 1 [IU] via SUBCUTANEOUS
  Administered 2020-02-24 (×2): 2 [IU] via SUBCUTANEOUS
  Administered 2020-02-25: 1 [IU] via SUBCUTANEOUS
  Administered 2020-02-25 (×2): 2 [IU] via SUBCUTANEOUS
  Administered 2020-02-25: 1 [IU] via SUBCUTANEOUS
  Administered 2020-02-25 (×2): 2 [IU] via SUBCUTANEOUS
  Administered 2020-02-26: 1 [IU] via SUBCUTANEOUS
  Administered 2020-02-26 (×3): 2 [IU] via SUBCUTANEOUS
  Administered 2020-02-26: 1 [IU] via SUBCUTANEOUS
  Administered 2020-02-26 – 2020-02-27 (×2): 2 [IU] via SUBCUTANEOUS
  Administered 2020-02-27: 3 [IU] via SUBCUTANEOUS
  Administered 2020-02-27: 2 [IU] via SUBCUTANEOUS
  Administered 2020-02-27: 1 [IU] via SUBCUTANEOUS
  Administered 2020-02-27: 2 [IU] via SUBCUTANEOUS
  Administered 2020-02-27 – 2020-02-28 (×3): 1 [IU] via SUBCUTANEOUS
  Administered 2020-02-28: 2 [IU] via SUBCUTANEOUS
  Administered 2020-02-28 (×2): 1 [IU] via SUBCUTANEOUS
  Administered 2020-02-29: 3 [IU] via SUBCUTANEOUS
  Administered 2020-02-29: 2 [IU] via SUBCUTANEOUS
  Administered 2020-02-29 – 2020-03-01 (×4): 1 [IU] via SUBCUTANEOUS
  Administered 2020-03-01: 3 [IU] via SUBCUTANEOUS
  Administered 2020-03-01: 1 [IU] via SUBCUTANEOUS
  Administered 2020-03-01 – 2020-03-02 (×2): 2 [IU] via SUBCUTANEOUS
  Administered 2020-03-02: 5 [IU] via SUBCUTANEOUS
  Administered 2020-03-02: 1 [IU] via SUBCUTANEOUS
  Administered 2020-03-02: 2 [IU] via SUBCUTANEOUS
  Administered 2020-03-02: 3 [IU] via SUBCUTANEOUS
  Administered 2020-03-02 – 2020-03-03 (×2): 2 [IU] via SUBCUTANEOUS
  Administered 2020-03-03: 5 [IU] via SUBCUTANEOUS
  Administered 2020-03-03: 1 [IU] via SUBCUTANEOUS
  Administered 2020-03-03: 3 [IU] via SUBCUTANEOUS
  Administered 2020-03-03 – 2020-03-06 (×16): 2 [IU] via SUBCUTANEOUS
  Administered 2020-03-06: 3 [IU] via SUBCUTANEOUS
  Administered 2020-03-06: 2 [IU] via SUBCUTANEOUS
  Administered 2020-03-06: 5 [IU] via SUBCUTANEOUS
  Administered 2020-03-06: 2 [IU] via SUBCUTANEOUS
  Administered 2020-03-07: 5 [IU] via SUBCUTANEOUS
  Administered 2020-03-07: 2 [IU] via SUBCUTANEOUS
  Administered 2020-03-07: 7 [IU] via SUBCUTANEOUS
  Administered 2020-03-07: 2 [IU] via SUBCUTANEOUS
  Administered 2020-03-07: 5 [IU] via SUBCUTANEOUS
  Administered 2020-03-08 (×2): 3 [IU] via SUBCUTANEOUS

## 2020-02-22 MED ORDER — POTASSIUM CHLORIDE 10 MEQ/50ML IV SOLN
10.0000 meq | INTRAVENOUS | Status: AC
  Administered 2020-02-22 (×2): 10 meq via INTRAVENOUS
  Filled 2020-02-22 (×2): qty 50

## 2020-02-22 NOTE — Progress Notes (Signed)
[facility] for Heparin Indication: Afib  Allergies  Allergen Reactions  . Orange Fruit Anaphylaxis, Hives and Other (See Comments)    Per Pt- Blisters around lips and Hives all over, also  . Penicillins Anaphylaxis    Did it involve swelling of the face/tongue/throat, SOB, or low BP? Yes Did it involve sudden or severe rash/hives, skin peeling, or any reaction on the inside of your mouth or nose? Yes Did you need to seek medical attention at a hospital or doctor's office? No When did it last happen?childhood If all above answers are "NO", may proceed with cephalosporin use.  [name] [Empagliflozin] Itching  . [name] [Insulin Glargine] Nausea And Vomiting    Patient Measurements: Height: 6\' 3"  (190.5 cm) Weight: 93.4 kg (205 lb 14.6 oz) IBW/kg (Calculated) : 84.5 Heparin dosing wt: 101 kg  Vital Signs: Temp: 97.7 F (36.5 C) (01/25 2000) Temp Source: Oral (01/25 2000) BP: 105/64 (01/25 1900) Pulse Rate: 67 (01/25 1900)  Labs: Recent Labs    02/20/20 0310 02/20/20 0319 02/21/20 0420 02/21/20 0625 02/21/20 1201 02/21/20 1502 02/21/20 2226 02/22/20 0415 02/22/20 1213 02/22/20 1411 02/22/20 2105  HGB 8.3*   < > 7.9* 9.2*  --  8.0*  --  8.0*  --   --   --   HCT 27.0*   < > 25.2* 27.0*  --  26.8*  --  25.9*  --   --   --   PLT 380  --  329  --   --   --   --  315  --   --   --   HEPARINUNFRC  --    < > <0.10*  --    < >  --    < > 0.19* 0.28*  --  0.25*  CREATININE 1.15   < > 1.16  --   --  1.14  --  1.16  --  1.17  --    < > = values in this interval not displayed.    Estimated Creatinine Clearance: 89.3 mL/min (by C-G formula based on SCr of 1.17 mg/dL).   Assessment: 50 yo male s/p impella 5.5 placement. Pt started on heparinized purge and systemic heparin. Pt now s/p CABG 12/15 with Impella remaining in place c/b significant bleeding and hematoma requiring opened chest and reexploration in OR 12/16. Chest  closed on 12/23, re-opened on 12/24. Underwent cardioversion in OR 12/28, s/p sternal wash out/application of wound VAC. Underwent sternal washout, wound VAC change, followed by sternal closure on 1/5. Chest was re-opened on 1/5 given compromised hemodynamics. Underwent impella removal on 1/21.   Pt remains on heparin for AFib. Heparin level came back sub-therapeutic at 0.25 units/mL on 1650 units/hr.  No s/sx of bleeding or infusion issues.  Goal of Therapy:  Heparin level ~0.3 units/mL Monitor platelets by anticoagulation protocol: Yes   Plan:  Increase heparin infusion to 1750 units/hr F/U AM labs  Brynne Doane D. Mina Marble, PharmD, BCPS, [place] 02/22/2020, 9:32 PM

## 2020-02-22 NOTE — Progress Notes (Signed)
      [address]       [place] [address]             [phone]      Relatively stable day Weaned off vasopressin Still on dobutamine at 5 Continue current Rx.  Revonda Standard Roxan Hockey, MD [facility] [phone]

## 2020-02-22 NOTE — Progress Notes (Addendum)
Patient ID: Corey Palmer, male   DOB: 12/01/1970, 50 y.o.   MRN: [id number]     Advanced Heart Failure Rounding Note  PCP-Cardiologist: No primary care provider on file.   Subjective:    - 12/7 Torsades -ICD shock x1.  - 12/11 Impella 5.5 placed.  - 12/12 Impella repositioned in OR - 12/15 CABG x 4 with LIMA-LAD, SVG-PDA/PLV, SVG-OM - 12/16 DCCV afib.  Back to OR to re-open chest and again to evacuate hematoma.  - 12/19 CVVH initiated  - 12/23 Chest closed - 12/24 Chest reopened - 12/26 RAP back into NSR - 12/27 back in atrial fibrillation, unable to rapid atrial pace out.   - 12/28 To OR, chest partially closed and wound vac placed.  DCCV to NSR.  - 12/31 Antibiotics stopped. Swan removed, HD catheter moved from right femoral to right IJ.  - 1/5 To OR for chest closure. Chest reopened emergently at bedside again to evacuate hematoma  - 1/10 To OR: omental flap closure of chest with wound vac, tracheostomy, G-tube, J-tube.    - 1/12 Plastics and EP consulted.  - 1/13 Palliative Care consulted.  - 1/15 Went into VT, Shock 200 J and amiodarone bolus 150.   - 1/16: Milrinone stopped.    - 1/17: Partial closure of chest in OR - 1/18 Switched to dobutamine 4 mcg - 1/21 Impella Extracted.  Developed hyperkalemia when CVVH held with wide QRS rhythm and required transient external pacing.    Co-ox 69% on dobutamine 5, vasopressin 0.02. Wt - 4lbs since yesterday, CVP 13, CRRT pulling 100 (tolerating).    FloTrac CVP 13 CO 10.1 CI 4.6 MAP 77 SVR 467   Objective:   Weight Range: 93.4 kg Body mass index is 25.74 kg/m.   Vital Signs:   Temp:  [97.2 F (36.2 C)-99.1 F (37.3 C)] 99.1 F (37.3 C) (01/24 2333) Pulse Rate:  [67-90] 73 (01/25 0700) Resp:  [12-30] 19 (01/25 0700) BP: (91-131)/(49-74) 109/65 (01/25 0700) SpO2:  [91 %-100 %] 100 % (01/25 0700) Arterial Line BP: (110-197)/(45-64) 143/59 (01/25 0700) FiO2 (%):  [40 %-50 %] 40 % (01/25 0315) Weight:  [93.4 kg] 93.4  kg (01/25 0500) Last BM Date: 02/21/20  Weight change: Filed Weights   02/20/20 0500 02/21/20 0600 02/22/20 0500  Weight: 99.1 kg 95.2 kg 93.4 kg    Intake/Output:   Intake/Output Summary (Last 24 hours) at 02/22/2020 0716 Last data filed at 02/22/2020 0700 Gross per 24 hour  Intake 4602.02 ml  Output 6017 ml  Net -1414.98 ml      Physical Exam   CVP 13 General:  Chronic ill appearing.  HEENT: Dry MM Neck: supple.  Trach.   Cor: Regular rate & rhythm. No rubs, gallops or murmurs.Dressing.  Lungs: Coarse.  Abdomen:  + distended. Good bowel sounds. Extremities: no cyanosis, clubbing, rash. BUE/BLE edema Neuro: Sedated.   Telemetry   SR rates 60s.  Personally reviewed.      Labs    CBC Recent Labs    02/21/20 0420 02/21/20 0625 02/21/20 1502 02/22/20 0415  WBC 16.2*  --   --  16.3*  NEUTROABS 14.5*  --   --  14.4*  HGB 7.9*   < > 8.0* 8.0*  HCT 25.2*   < > 26.8* 25.9*  MCV 88.1  --   --  90.2  PLT 329  --   --  315   < > = values in this interval not displayed.   Basic Metabolic  Panel Recent Labs    02/21/20 1502 02/22/20 0415  NA 133* 129*  K 4.0 4.1  CL 101 97*  CO2 22 23  GLUCOSE 188* 215*  BUN 41* 43*  CREATININE 1.14 1.16  CALCIUM 8.0* 7.9*  MG 2.3 2.4  PHOS 2.4*  2.5 2.3*   Liver Function Tests Recent Labs    02/21/20 1502 02/22/20 0415  ALBUMIN 1.7* 1.8*   No results for input(s): LIPASE, AMYLASE in the last 72 hours. Cardiac Enzymes No results for input(s): CKTOTAL, CKMB, CKMBINDEX, TROPONINI in the last 72 hours.  BNP: BNP (last 3 results) Recent Labs    01/11/2020 1619  BNP 577.5*    ProBNP (last 3 results) No results for input(s): PROBNP in the last 8760 hours.   D-Dimer No results for input(s): DDIMER in the last 72 hours. Hemoglobin A1C No results for input(s): HGBA1C in the last 72 hours. Fasting Lipid Panel No results for input(s): CHOL, HDL, LDLCALC, TRIG, CHOLHDL, LDLDIRECT in the last 72 hours. Thyroid  Function Tests No results for input(s): TSH, T4TOTAL, T3FREE, THYROIDAB in the last 72 hours.  Invalid input(s): FREET3  Other results:   Imaging    DG CHEST PORT 1 VIEW  Result Date: 02/22/2020 CLINICAL DATA:  Tracheostomy.  Chest tube. EXAM: PORTABLE CHEST 1 VIEW COMPARISON:  02/20/2020. FINDINGS: Tracheostomy tube, right PICC line, mediastinal drainage catheter, right chest wall drain, bilateral chest tubes in stable position. No pneumothorax. Cardiac pacer and stable position. Stable cardiomegaly. Diffuse bilateral interstitial prominence consistent with interstitial edema and or pneumonitis. Similar findings on prior exam. Stable right apical pleural based density. Small right pleural effusion. No pneumothorax. IMPRESSION: 1. Lines and tubes including bilateral chest tubes in stable position. No pneumothorax. 2. Cardiac pacer stable position.  Stable cardiomegaly. 3. Diffuse bilateral interstitial prominence consistent with interstitial edema and or pneumonitis. Similar findings noted on prior exam. Stable right apical pleural based density. Small right pleural effusion. Electronically Signed   By: Juarez   On: 02/22/2020 06:32     Medications:     Scheduled Medications: . sodium chloride   Intravenous Once  . vitamin C  500 mg Per Tube TID  . aspirin  324 mg Per Tube Daily  . atorvastatin  80 mg Per Tube Daily  . busPIRone  10 mg Per Tube TID  . chlorhexidine gluconate (MEDLINE KIT)  15 mL Mouth Rinse BID  . Chlorhexidine Gluconate Cloth  6 each Topical Daily  . darbepoetin (ARANESP) injection - NON-DIALYSIS  100 mcg Subcutaneous Q Sat-1800  . docusate  100 mg Per Tube BID  . feeding supplement (PROSource TF)  45 mL Per Tube TID  . fiber  1 packet Per Tube BID  . FLUoxetine  40 mg Per Tube BID  . hydrocortisone sod succinate (SOLU-CORTEF) inj  50 mg Intravenous Q6H  . HYDROmorphone  4 mg Per Tube Q6H  . insulin aspart  2 Units Subcutaneous Q4H  . insulin aspart   2-6 Units Subcutaneous Q4H  . insulin detemir  8 Units Subcutaneous Q12H  . LORazepam  1 mg Intravenous Q4H  . mouth rinse  15 mL Mouth Rinse 10 times per day  . melatonin  5 mg Per Tube Daily  . mexiletine  150 mg Per Tube Q8H  . midodrine  20 mg Per Tube TID WC  . multivitamin  1 tablet Per Tube QHS  . nutrition supplement (JUVEN)  1 packet Per Tube BID BM  . pantoprazole (PROTONIX) IV  40 mg Intravenous Q12H  . polyethylene glycol  17 g Per Tube Daily  . sodium chloride flush  10-40 mL Intracatheter Q12H  . thiamine  100 mg Per Tube Daily  . valproic acid  250 mg Per Tube BID    Infusions: .  prismasol BGK 4/2.5 500 mL/hr at 02/22/20 0522  .  prismasol BGK 4/2.5 200 mL/hr at 02/21/20 2027  . sodium chloride 1,000 mL (02/21/20 2200)  . sodium chloride    . amiodarone 30 mg/hr (02/22/20 0700)  . anidulafungin Stopped (02/21/20 2302)  . dexmedetomidine (PRECEDEX) IV infusion 1 mcg/kg/hr (02/22/20 0700)  . dextrose    . DOBUTamine 5 mcg/kg/min (02/22/20 0700)  . electrolyte-A Stopped (02/03/20 5498)  . epinephrine Stopped (02/19/20 1600)  . feeding supplement (PIVOT 1.5 CAL) 1,000 mL (02/22/20 0405)  . heparin 1,600 Units/hr (02/22/20 0700)  . HYDROmorphone 2 mg/hr (02/22/20 0700)  . insulin Stopped (02/21/20 1504)  . lactated ringers 10 mL/hr at 02/22/20 0700  . meropenem (MERREM) IV Stopped (02/22/20 0548)  . norepinephrine (LEVOPHED) Adult infusion Stopped (02/21/20 0157)  . prismasol BGK 4/2.5 1,500 mL/hr at 02/22/20 0513  . vancomycin Stopped (02/21/20 1701)  . vasopressin 0.02 Units/min (02/22/20 0700)    PRN Medications: sodium chloride, Place/Maintain arterial line **AND** sodium chloride, artificial tears, bisacodyl **OR** bisacodyl, dextrose, dextrose, docusate, heparin, heparin, HYDROmorphone, lip balm, metoprolol tartrate, midazolam, polyethylene glycol, sodium chloride flush     Assessment/Plan   1. Cardiogenic shock/acute on chronic systolic CHF: Initially  nonischemic cardiomyopathy.  Possible familial cardiomyopathy as both parents had cardiomyopathy and died at around 108. However, [employer] gene testing did not show any common mutation for cardiomyopathy.  However, this admission noted to have severe 3 vessel disease so suspect component of ischemic cardiomyopathy.  St Jude ICD.  Echo in 8/20 with EF 15% and mildly decreased RV function. Echo this admission with EF < 20%, moderate LV dilation, RV mildly reduced, severe LAE, no significant MR. Low output HF with markedly low EF.  He has a long history of cardiomyopathy (20 yrs), tends to minimize symptoms.  Cardiorenal syndrome with creatinine up to 2.2,  stabilized on milrinone and Impella 5.5. SCr 1.44 day of CABG. CABG 12/15.  Post-op shock, back to OR 12/16 to open chest (chest wall compartment syndrome) and again to evacuate hematoma.  TEE post-op with severe RV dysfunction.  CVVHD initiated 12/19 for volume removal in setting of rising Scr/decreased UOP. Suspect ATN. S/p chest closure 12/23. Hemodynamics much worse on 12/24 with rising pressor demands. Co-ox 36%.  Bedside echo severe biventricular dysfunction with marked septal bounce. Chest re-opened 12/24.  Was atrial paced back to NSR on 12/26 with improvement in hemodynamics but back in atrial fibrillation on 12/27 and unable to pace out, DCCV to NSR on 12/28. Went back to OR 1/5 for chest closure but several hours later required emergent re-opening of chest at bedside to evacuate hematoma. To OR again 1/10 with closure of chest with omental flap with G & J tube.  1/17 partial closure. 1/21 Impella removed.  Off CVVH post-removal, he developed hyperkalemia and wide QRS rhythm requiring transient external pacing, also with suspected septic shock (low SVR, fever, rising WBCs).   - On dobutamine 5, vasopressin 0.02 with co-ox 69%. CVP 13. SVR 467.  - Would aim for UF -50 cc/hr net via CVVH but currently pulling 100 and tolerating.  - Not candidate to replace  Impella (at this point, not candidate for LVAD or transplant). He  can stay on dobutamine long-term.  2. Atrial fibrillation: H/o PAF.  He was on dofetilide in the remote past but this was stopped due to noncompliance.  He had an upper GI bleed from antral ulcers in 3/12. He was seen by GI and was deemed safe to restart anticoagulation as long as he remains on a PPI.  No apparent recurrence of AF until just prior to this admission, was cardioverted back to NSR in ER but went back into atrial fibrillation.  Post-op afib, DCCV to NSR/BiV pacing am 12/16 -> back to AF. Rapid atrial pacing back to SR. Maintaining NSR.  - Continue amio 30 mg per hour and on systemic heparin gtt.   3. CAD:   Cath this admission with severe 3 vessel disease. CABG x 4 on 12/15.  - Continue atorvastatin, ASA.  4. Acute on chronic hypoxemic respiratory failure: Vent per CCM.  Has tracheostomy.  - CCM following.  5. AKI on CKD stage 3: Likely ATN/cardiorenal. Anuric. On CVVH.    - Suspect he is going to be HD dependent.  6. Diabetes: Insulin.  7. Hyponatremia: Resolved with CVVH.  8. Torsades/ICD shock: 12/7/212 in hospital event, in setting of severe hypokalemia and hypomagnesemia.   9. Gout: h/o gout. Complained of rt knee pain c/w previous flares - treated w/ prednisone burst (completed).  10: ID: Septic shock 1/21 with fever, low SVR, and elevated WBCs. Cultures NGTD x 2 days.  IJ catheter removed.  - On vancomycin/meropenem, anidulafungin.  11. FEN: c/w TFs.  12. Anemia: Hgb 8.0 today.  Transfuse < 7.5.  13. Stage II Pressure Ulcer- sacrum: Continue to reposition every 2 hours R/L  14. Pleural Effusion: Loculated rt apical pleural effusion noted on imaging. Continue UF through CVVH.  PCCM following  15. VT: Amiodarone gtt + mexiletine.   16. Deconditioning: Profound.   Per dayshift RN on 1/23, family would like to get all disciplines together for a meeting to discuss patient's status.    Carlene Coria 02/22/2020 7:16 AM  Patient seen with NP, agree with the above note.   He is now on dobutamine 5, vasopressin 0.02. Co-ox 69%. CVP 15.  I/Os negative, pulling -50 cc/hr net via CVVH, weight down.   Afebrile on vancomycin/meropenem/anidulafungin.  Heparin gtt ongoing.   General: NAD Neck: Trach. JVP 12 cm, no thyromegaly or thyroid nodule.  Lungs: Decreased at bases. CV: Nondisplaced PMI.  Heart regular S1/S2, no S3/S4, no murmur.  No peripheral edema.   Abdomen: Soft, nontender, no hepatosplenomegaly, no distention.  Skin: Intact without lesions or rashes.  Neurologic:Will wake up per nursing.  Extremities: No clubbing or cyanosis.  HEENT: Normal.   Mixed septic/cardiogenic shock.  Improving.  Continue dobutamine 5, will wean vasopressin to 0.01 and can stop this afternoon if remains stable.  Continue midodrine.  Decrease hydrocortisone to 50 q8 hrs.   Continue CVVH, goal UF net -50-100 cc/hr today.   Continue antibiotics/antifungal, he is afebrile with WBCs trending down.   Transfuse hgb < 7.5.   Passive range of motion by nursing, PT.  Unfortunately, I think that best case scenario at this point is going to be LTACH with HD.    CRITICAL CARE Performed by: Loralie Champagne  Total critical care time: 40 minutes  Critical care time was exclusive of separately billable procedures and treating other patients.  Critical care was necessary to treat or prevent imminent or life-threatening deterioration.  Critical care was time spent personally by me on the following  activities: development of treatment plan with patient and/or surrogate as well as nursing, discussions with consultants, evaluation of patient's response to treatment, examination of patient, obtaining history from patient or surrogate, ordering and performing treatments and interventions, ordering and review of laboratory studies, ordering and review of radiographic studies, pulse oximetry and  re-evaluation of patient's condition.  Loralie Champagne 02/22/2020 8:00 AM

## 2020-02-22 NOTE — Plan of Care (Signed)
  Problem: Cardiac: Goal: Ability to achieve and maintain adequate cardiopulmonary perfusion will improve Outcome: Progressing   Problem: Education: Goal: Knowledge of General Education information will improve Description: Including pain rating scale, medication(s)/side effects and non-pharmacologic comfort measures Outcome: Progressing   Problem: Health Behavior/Discharge Planning: Goal: Ability to manage health-related needs will improve Outcome: Progressing   Problem: Clinical Measurements: Goal: Ability to maintain clinical measurements within normal limits will improve Outcome: Progressing Goal: Diagnostic test results will improve Outcome: Progressing Goal: Respiratory complications will improve Outcome: Progressing Goal: Cardiovascular complication will be avoided Outcome: Progressing   Problem: Activity: Goal: Risk for activity intolerance will decrease Outcome: Progressing   Problem: Coping: Goal: Level of anxiety will decrease Outcome: Progressing   Problem: Skin Integrity: Goal: Risk for impaired skin integrity will decrease Outcome: Progressing   Problem: Education: Goal: Ability to demonstrate management of disease process will improve Outcome: Progressing Goal: Ability to verbalize understanding of medication therapies will improve Outcome: Progressing   Problem: Activity: Goal: Capacity to carry out activities will improve Outcome: Progressing   Problem: Cardiac: Goal: Ability to achieve and maintain adequate cardiopulmonary perfusion will improve Outcome: Progressing   Problem: Education: Goal: Ability to verbalize understanding of medication therapies will improve Outcome: Progressing   Problem: Activity: Goal: Capacity to carry out activities will improve Outcome: Progressing   Problem: Cardiac: Goal: Ability to achieve and maintain adequate cardiopulmonary perfusion will improve Outcome: Progressing   Problem: Education: Goal: Will  demonstrate proper wound care and an understanding of methods to prevent future damage Outcome: Progressing Goal: Knowledge of disease or condition will improve Outcome: Progressing Goal: Knowledge of the prescribed therapeutic regimen will improve Outcome: Progressing Goal: Individualized Educational Video(s) Outcome: Progressing   Problem: Cardiac: Goal: Will achieve and/or maintain hemodynamic stability Outcome: Progressing   Problem: Clinical Measurements: Goal: Postoperative complications will be avoided or minimized Outcome: Progressing   Problem: Respiratory: Goal: Respiratory status will improve Outcome: Progressing   Problem: Skin Integrity: Goal: Wound healing without signs and symptoms of infection Outcome: Progressing Goal: Risk for impaired skin integrity will decrease Outcome: Progressing

## 2020-02-22 NOTE — Progress Notes (Signed)
4 Days Post-Op Procedure(s) (LRB): REMOVAL OF IMPELLA LEFT VENTRICULAR ASSIST DEVICE (N/A) TRANSESOPHAGEAL ECHOCARDIOGRAM (TEE) (N/A) Subjective: Sedated on ventilator  Objective: Vital signs in last 24 hours: Temp:  [97.4 F (36.3 C)-99.1 F (37.3 C)] 99.1 F (37.3 C) (01/24 2333) Pulse Rate:  [69-90] 69 (01/25 0800) Cardiac Rhythm: Normal sinus rhythm;Heart block (01/24 2000) Resp:  [12-30] 22 (01/25 0800) BP: (91-131)/(49-74) 107/62 (01/25 0800) SpO2:  [91 %-100 %] 100 % (01/25 0800) Arterial Line BP: (110-197)/(45-64) 134/54 (01/25 0800) FiO2 (%):  [40 %-50 %] 40 % (01/25 0315) Weight:  [93.4 kg] 93.4 kg (01/25 0500)  Hemodynamic parameters for last 24 hours: CVP:  [10 mmHg-20 mmHg] 17 mmHg  Intake/Output from previous day: 01/24 0701 - 01/25 0700 In: 4602 [I.V.:2018; XF/GH:8299.3; IV Piggyback:629.7] Out: 6017 [Drains:10; Stool:475; Chest Tube:40] Intake/Output this shift: No intake/output data recorded.  General appearance: sedated Neurologic: sedated Heart: slightly irregular, normal rate Lungs: rhonchi bilaterally Abdomen: soft, incision with minimal drainage Wound: VAC in place  Lab Results: Recent Labs    02/21/20 0420 02/21/20 0625 02/21/20 1502 02/22/20 0415  WBC 16.2*  --   --  16.3*  HGB 7.9*   < > 8.0* 8.0*  HCT 25.2*   < > 26.8* 25.9*  PLT 329  --   --  315   < > = values in this interval not displayed.   BMET:  Recent Labs    02/21/20 1502 02/22/20 0415  NA 133* 129*  K 4.0 4.1  CL 101 97*  CO2 22 23  GLUCOSE 188* 215*  BUN 41* 43*  CREATININE 1.14 1.16  CALCIUM 8.0* 7.9*    PT/INR: No results for input(s): LABPROT, INR in the last 72 hours. ABG    Component Value Date/Time   PHART 7.401 02/21/2020 0625   HCO3 24.3 02/21/2020 0625   TCO2 26 02/21/2020 0625   ACIDBASEDEF 1.0 02/21/2020 0625   O2SAT 69.0 02/22/2020 0435   CBG (last 3)  Recent Labs    02/21/20 2124 02/22/20 0008 02/22/20 0437  GLUCAP 240* 192* 278*     Assessment/Plan: S/P Procedure(s) (LRB): REMOVAL OF IMPELLA LEFT VENTRICULAR ASSIST DEVICE (N/A) TRANSESOPHAGEAL ECHOCARDIOGRAM (TEE) (N/A) - Remains critically ill   CV- acute on chronic systolic heart failure/ cardiogenic shock  on dobutamine, vasopressin being weaned     A fib- in SR currently, on heparin  RESP- VDRF, vent per CCM  RENAL- renal failure on CVVHD  Hyponatremia- defer to Nephrology  ENDO- CBG elevated- will increase levemir from 8 to 12 U BID  GI- on goal TF  ID- Afebrile, WBC stable on Vanco, meropenem and Eraxis  Sternal wound- VAC in place, Plastics planning debridement 1/27  Decubitus- stage II   LOS: 54 days    Corey Palmer 02/22/2020

## 2020-02-22 NOTE — Progress Notes (Signed)
NAME:  Corey Palmer, MRN:  [id number], DOB:  1970/07/15, LOS: 51 ADMISSION DATE:  01/16/2020, CONSULTATION DATE: 01/04/2020 REFERRING MD: Aundra Dubin CHIEF COMPLAINT: Respiratory failure post Impella  HPI/Course in hospital  50 year old man admitted 12/1 with A. fib/RVR, torsades, cardiogenic shock [EF 15] requiring Impella placement (removed 1/21), CABG x 4 c/b cardiac tamponade requiring chest reopening (now with VAC), AKI requiring on CRRT.  Past Medical History:    has a past medical history of Anginal pain ([place]), Anxiety, Arthritis, Automatic implantable cardioverter-defibrillator in situ (2007), Bipolar disorder ([place]), CAD (coronary artery disease), Cancer ([place]) (2013), CHF (congestive heart failure) ([place]), Chondrocalcinosis of right knee (4/78/2956), Chronic systolic heart failure ([place]), Depression, Diabetes uncomplicated adult-type II, Dilated cardiomyopathy ([place]), Dyslipidemia, Dysrhythmia, GERD (gastroesophageal reflux disease), Gout, unspecified, Hepatic steatosis (2011), History of kidney stones, echocardiogram, Hypertension, Obesity (BMI 30.0-34.9), Pacemaker, Paroxysmal atrial fibrillation ([place]), Peptic ulcer, Shortness of breath, and Sleep apnea.  Significant Hospital Events:  12/2 Afib with RVR. Cardioverted in ED. AICD did not fire. Milrinone for low co ox.  12/7 ICD fired, Torsades- likely from low K of 2.8.  12/8 Cath- multivessel disease w/ low CO. Volume overloaded.  12/11 Underwent Impella 5.5 insertion for persistent symptoms of forward failure with low cardiac indices. 12/12 Back to the OR for displacement of Impella. Extubated.  12/15 CABG x4, with LIMA-LAD, SVG-PDA/PLV, SVG-OM 12/16 Emergently opened chest at bedside for tamponade, clot compressing right atrium  12/16 To OR for Exploration of chest due to bleeding  12/19 Started CRRT 12/23 Sternal Closure in OR 12/24 Worsening shock.  Chest reopened at bedside with improvement in hemodynamics.  No mediastinal  hematoma. 12/25 A flutter > rapid a paced to sinus rhythm.  Remains on high-dose pressors, inotropes 12/28 Weaning vasopressor requirements.  For chest washout today.  Back in atrial fibrillation, refractory to increased amiodarone and attempted overdrive pacing.  DCCV to NSR. Tolerating fluid removal via CRRT. 1/03 Appears euvolemic.  Adjusting glycemic control.  Chest still open so not weaning 1/04 Spiking fever, Vanco meropenem resumed after culture sent from blood and sputum. 1/05 Awaiting return to the OR for wound VAC dressing assessment fever curve and white blood cell count improving after antibiotics resumed the day prior, did have chest closed, developed tamponade physiology shortly after had to go back for emergent reopening of chest, return to intensive care and refractory shock 1/06 Wound VAC dressing replaced.  Still in shock.  Starting to wean sedation 1/07 Still on high-dose pressors, CRRT 1/15 Recurrent Vtach, shocked 1/18 Attempted pressure change to P 3 with addition of Dobutamine . MAP goal > 60 1/19 Ongoing attempts at impella wean, on PS 1/20 Stable on vent, following commands with sedation weaned, continued Impella wean attempt 1/21 Impella removed, on FloTrac, WBC 22K, pancultured, Cefepime/Vanc started empirically for low SVR, concern for sepsis 1/22 antifungals added Impella removed on 1/21, 3 PM CT x3 in place  Consults:  PCCM, nephrology, cardiology, cardiothoracic surgery, EP  Procedures:  R PICC 12/2 >> R axillary Impella 5.5 12/11 >> 1/21 ETT 12/15 >> 1/10 R femoral arterial sheath 12/15 >> 12/31 R femoral HD cath 12/19 >> 12/31 Chest tube - right pleural, left pleural , mediastinal 12/23 >> 1/5 R IJ HD cath 12/31 >> R Radial A-Line 1/6 >> Chest tube - L pleural, R pleural, mediastinal 1/10 >> Trach 1/10 PEG 1/10  Significant Diagnostic Tests:  12/8 LHC  Elevated R and L heart filling pressures with low CO, severe 3-vessel disease  Micro Data:  1/19  BCx2 -no growth at 3 days 1/21 OR Cx >>  Antimicrobials:  Levaquin 1/21 (periop ppx) Meropenem 1/21>> Vanc 1/21 >> Anidulafungin 1/22>>  Interval history:  No acute events. Remains on inotropes and vasopressors.  Objective   Blood pressure 109/65, pulse 73, temperature 99.1 F (37.3 C), temperature source Oral, resp. rate 19, height 6\' 3"  (1.905 m), weight 93.4 kg, SpO2 100 %. CVP:  [10 mmHg-20 mmHg] 17 mmHg  Vent Mode: PRVC FiO2 (%):  [40 %-50 %] 40 % Set Rate:  [24 bmp] 24 bmp Vt Set:  [510 mL] 510 mL PEEP:  [5 cmH20] 5 cmH20 Plateau Pressure:  [21 cmH20-27 cmH20] 22 cmH20   Intake/Output Summary (Last 24 hours) at 02/22/2020 0755 Last data filed at 02/22/2020 0700 Gross per 24 hour  Intake 4602.02 ml  Output 6017 ml  Net -1414.98 ml   Filed Weights   02/20/20 0500 02/21/20 0600 02/22/20 0500  Weight: 99.1 kg 95.2 kg 93.4 kg   CVP:  [10 mmHg-20 mmHg] 17 mmHg  Examination: General: Chronically ill-appearing male, s/p trach HEENT: Moist oral mucosa, tracheostomy tube in place Neuro: Sedated, generalized weakness, not following commands CV: RRR, Chest partially closed. hest tubes in place PULM: Reduced air entry at the bases, no wheezes GI: Active bowel sounds, PEG in place Extremities: Mild edema  skin: Warm, dry, no rashes.  Assessment & Plan:   Acute hypoxic and hypercapnic respiratory failure - s/p trach 1/10 Continue full vent support Follow intermittent CXR  Cardiogenic shock requiring vasopressors and inotropic - s/p Impella which was removed 1/21 and now not a candidate for replacement.  Also s/p CABG complicated by tamponade with multiple chest closures and emergent re-opening, most recently partially closed 1/17. Question of sepsis due to healthcare associated pneumonia, with low SVR Continue dobutamine, midodrine and vasopressin Flowtrack in place Goal MAP greater than 65 Continue IV hydrocortisone Continue IV vancomycin, meropenem and antifungal for  possible sepsis (though cultures have been negative) Follow-up fungal cultures-blood culture and galactomannan Family has requested multidisciplinary meeting Wed 1/26 at 1300 as they feel that so far, they have been receiving mixed messages on prognosis etc  Persistent small right-sided loculated pleural effusion Continue to monitor  Atrial fibrillation with RVR Torsades Recurrent V. tach Continue amiodarone, mexiletine per cards / EP / TCTS Continue to monitor and replete electrolytes as needed  Dysphagia GJ tube present Resume tube feeds  Acute kidney injury on chronic kidney disease stage IIIa Cardiorenal syndrome On CRRT for ultrafiltration, currently 50 - 100/hr removal  Anemia Multifactorial, Blood loss and critical illness Monitor H&H  Diabetes with hyperglycemia Continue basal and bolus insulin Fingerstick goal 140-180  Profound muscular deconditioning.  Due to prolonged critical illness Physical therapy when appropriate  Daily Goals Checklist  Pain/Anxiety/Delirium protocol (if indicated): Precedex/Dilaudid gtt, weaning as able VAP protocol (if indicated): Bundle in place DVT prophylaxis:  Full dose ASA Nutrition Status: TF GI prophylaxis: Pantoprazole Glucose control: Insulin (Basal, Standing, SSI) Mobility/therapy needs: Bedrest  Code Status: Full  Family Communication: Per primary Disposition: ICU   CC time: 35 min.   Montey Hora, [facility] For pager details, please see AMION 02/22/2020, 8:30 AM

## 2020-02-22 NOTE — Progress Notes (Signed)
Patient ID: Corey Palmer, male   DOB: 12/24/1970, 50 y.o.   MRN: [id number]  Medical records reviewed  This NP f/u patient at the bedside  for palliative medicine  needs and emotional support and spoke to wife/[name] by phone Updated on current medical situation and answered her questions and concerns to the best of my ability.  In response to her request for a sitdown meeting with the physicians and family members I have coordinated a meeting for tomorrow at 1:00. I spoke with Tessa/PA with cardiothoracic surgeons and she is assures me that representative will be there.  Discussed with via secure chat and Dr. Elon Spanner will also be present and Dr. Aundra Dubin communicated that he was available at 1:00.    Emotional support offered    Total time spent on the unit was 25 minutes  Greater than 50% of the time was spent in counseling and coordination of care  Wadie Lessen NP  Palliative Medicine Team Team Phone # [phone] Pager [phone]

## 2020-02-22 NOTE — Evaluation (Signed)
Occupational Therapy Evaluation Patient Details Name: Corey Palmer MRN: [id number] DOB: October 18, 1970 Today's Date: 02/22/2020    History of Present Illness 50 yo male admitted 12/1 with A. fib/RVR, torsades, cardiogenic shock (EF 15) requiring Impella placement (removed 1/21), CABG x 4 c/b cardiac tamponade requiring chest reopening (now with VAC), AKI requiring initiation of CRRT. PMH including anxiety, arthritis, CAD, CHF, DM type ll, HTN, obesity, pacemaker, paroxysmal a-fib, and sleep apnea.   Clinical Impression   PTA, pt was living with his wife and was independent and working. Pt currently requiring Total A for ADLs and bed mobility. Focused session on bed level ROM of BUE, LLE, and cervical spine to optimize functional use and reduce pain. Pt with increased grimacing at [place]; pt's wife mentioning that pt has history of gout; notified RN. Pending family conversation with palliative care, recommend dc to LTACH/SNF for further support and therapy. Will continue to follow acutely as admitted per family desire to optimize safety and occupational performance.     Follow Up Recommendations  LTACH (Pending family's conversation with palliative)    Equipment Recommendations  Other (comment);Wheelchair (measurements OT);Wheelchair cushion (measurements OT);3 in 1 bedside commode;Hospital bed (Pending conversation with palliative)    Recommendations for Other Services PT consult     Precautions / Restrictions Precautions Precautions: Fall;Sternal Precaution Booklet Issued: No (pt not awake enough to comprehend) Precaution Comments: CRRT via R groin line, Vent on 5 of peep, wound vac on chest Restrictions Other Position/Activity Restrictions: sternal precautions      Mobility Bed Mobility               General bed mobility comments: Total A for repositioning    Transfers                 General transfer comment: Defer to for safety and tolerance    Balance Overall  balance assessment:  (unable due to R groing CRRT line)                                         ADL either performed or assessed with clinical judgement   ADL Overall ADL's : Needs assistance/impaired                                       General ADL Comments: Total A for ADLs and bed mobility     Vision Baseline Vision/History: Wears glasses Wears Glasses: At all times Patient Visual Report: Other (comment) (unsure)       Perception     Praxis      Pertinent Vitals/Pain Pain Assessment: Faces Faces Pain Scale: Hurts even more Pain Location: grimacing with ROM in L fingers/wrist/hand, bilat ankles and L hip and knee Pain Descriptors / Indicators: Grimacing Pain Intervention(s): Monitored during session;Limited activity within patient's tolerance;Repositioned     Hand Dominance Right   Extremity/Trunk Assessment Upper Extremity Assessment Upper Extremity Assessment: LUE deficits/detail;RUE deficits/detail RUE Deficits / Details: WFL for PROM. Limiting shoulder ROM due to sternal precautions. Pt with sponantious movement of hand or arm but not noting purposeful movement LUE Deficits / Details: swollen and tight fingers, grimacing with ROM, some voluntary mvmt however more shaky, no initiation at shoulder or elbow, limited shld ROM to adhere to sternal precautions LUE Coordination: decreased fine motor;decreased gross motor  Lower Extremity Assessment Lower Extremity Assessment: Defer to PT evaluation RLE Deficits / Details: no hip ROM due to CRRT line, can get to neutral at ankle, knee flexion to about 25 deg to adhere to R hip restrictions LLE Deficits / Details: very swollen and tight, unable to rotate leg to neutral from external rotation, limited hip and knee ROM due to grimacing and tightness   Cervical / Trunk Assessment Cervical / Trunk Assessment: Other exceptions (been flat on back for over a month) Cervical / Trunk Exceptions:  tendency for left lateral flexion and rotation to left   Communication Communication Communication: Expressive difficulties;Tracheostomy   Cognition Arousal/Alertness: Lethargic Behavior During Therapy: Flat affect Overall Cognitive Status: Impaired/Different from baseline Area of Impairment: Following commands;Attention                   Current Attention Level:  (to lethargic to engaged for longer than a couple of seconds)   Following Commands: Follows one step commands with increased time;Follows one step commands inconsistently       General Comments: Pt very lethargic and not following commands   General Comments  RN and wife present during session. Vitals stable    Exercises Exercises: General Lower Extremity;General Upper Extremity;Other exercises General Exercises - Upper Extremity Shoulder Flexion: PROM;Both;10 reps;Supine (within sternal precautions) Shoulder ABduction: PROM;Both;10 reps;Supine (within sternal precautions) Elbow Flexion: PROM;Both;10 reps;Seated Elbow Extension: PROM;Both;10 reps;Supine Wrist Flexion: PROM;Both;10 reps;Supine Wrist Extension: PROM;Both;10 reps;Supine Digit Composite Flexion: PROM;Left;10 reps;Supine Composite Extension: PROM;Left;10 reps;Supine General Exercises - Lower Extremity Ankle Circles/Pumps: PROM;Both;10 reps;Supine Heel Slides: PROM;Left;10 reps;Supine (R limited by R hip ROM restriction due to CRRT line) Other Exercises Other Exercises: Cervical ROM; x5; PROM   Shoulder Instructions      Home Living Family/patient expects to be discharged to:: Private residence Living Arrangements: Spouse/significant other Available Help at Discharge: Family;Available 24 hours/day Type of Home: House Home Access: Level entry     Home Layout: Two level;Able to live on main level with bedroom/bathroom     Bathroom Shower/Tub: Occupational psychologist: Standard     Home Equipment: None   Additional Comments:  Wife present and provided information. Pt has been in the hospital since 12/30/2019      Prior Functioning/Environment Level of Independence: Independent        Comments: works as Educational psychologist Problem List: Decreased strength;Decreased range of motion;Decreased activity tolerance;Impaired balance (sitting and/or standing);Decreased cognition;Decreased safety awareness;Decreased knowledge of precautions;Decreased knowledge of use of DME or AE;Cardiopulmonary status limiting activity;Impaired UE functional use;Pain      OT Treatment/Interventions: Self-care/ADL training;Therapeutic exercise;Energy conservation;DME and/or AE instruction;Therapeutic activities;Patient/family education    OT Goals(Current goals can be found in the care plan section) Acute Rehab OT Goals Patient Stated Goal: unstated OT Goal Formulation: Patient unable to participate in goal setting Time For Goal Achievement: 03/07/20 Potential to Achieve Goals: Fair  OT Frequency: Min 2X/week   Barriers to D/C:            Co-evaluation              AM-PAC OT "6 Clicks" Daily Activity     Outcome Measure Help from another person eating meals?: Total Help from another person taking care of personal grooming?: Total Help from another person toileting, which includes using toliet, bedpan, or urinal?: Total Help from another person bathing (including washing, rinsing, drying)?: Total Help from another person to put on and taking off  regular upper body clothing?: Total Help from another person to put on and taking off regular lower body clothing?: Total 6 Click Score: 6   End of Session Equipment Utilized During Treatment: Oxygen (vent) Nurse Communication: Mobility status  Activity Tolerance: Patient limited by fatigue;Patient limited by pain Patient left: in bed;with call bell/phone within reach  OT Visit Diagnosis: Unsteadiness on feet (R26.81);Other abnormalities of gait and mobility  (R26.89);Muscle weakness (generalized) (M62.81);Pain Pain - Right/Left: Left Pain - part of body: Arm;Leg (with ROM)                Time: 6837-2902 OT Time Calculation (min): 21 min Charges:  OT General Charges $OT Visit: 1 Visit OT Evaluation $OT Eval High Complexity: 1 High  Tinisha Etzkorn MSOT, OTR/L Acute Rehab Pager: [phone] Office: [place] 02/22/2020, 2:35 PM

## 2020-02-22 NOTE — Progress Notes (Signed)
[facility] NEPHROLOGY PROGRESS NOTE  Assessment/ Plan: Pt is a 50 y.o. yo male  with familial cardiomyopathy EF20% s/p redo CABG 76/28/31 complicated by persistent shock, multiple attempts at sternal closure with formation of chest hematoma and tamponade, Afib with RVR, and AKI requiring CRRT.    # Anuric AKI/CKD stage III- presumably due to ischemic ATN in setting of cardiogenic shock s/p CABG.  CRRT started 01/16/20.  All fluids 4K/2.5Ca.  RIJ on 01/28/20. Continue CRRT, no prescription change, UF is about net even to slightly net negative- cont this rate.  Hyperkalemia improved.  The line was changed to right femoral catheter.  He is on inotropic support and vasopressin currently.  I'm not sure he would tolerate HD at this point, particularly in light of his significant fluid removal needs due to obligatory intakes with meds, etc.     # CAD s/p CABG complicated by cardiogenic shock and post-op hematoma. secondary closure on 12/23 then had to be re opened. Went to the OR for washout on 12/28, bedside washout 02/01/19.  OR 02/04/2020 for closure, then had emergent reopening at the bedside 02/10/2020 d/t anterior chest hematoma again.   # Cardiogenic shock- underlying familial cardiomyopathy s/p redo CABG, EF ~20%. Remains on pressors, inotrope. Midodrine 20 tid.  The Impella was removed on 1/21.  # ABLA- s/p postoperative bleeding into chest cavity s/p re-exploration for tamponade/hematoma. Received multiple blood products  # Atrial fibrillation - s/p DCCV on 12/16. On amiodarone.   # VDRF- per CHF/PCCMsvc. Trach/peg 1/10  #-Leukocytosis/  Fever on 02/03/2020: per primary, started on broad-spectrum antibiotics.  Appears cultures are NGTD.  #Hypophosphatemia: Repleted IV phosphate previously, ok this AM.  Subjective: Seen and examined in ICU.  Running CRRT.  On vent.  Remains critically ill without any new event.  I/Os 4.6 / 6.0.   Objective Vital signs in last 24  hours: Vitals:   02/22/20 0630 02/22/20 0645 02/22/20 0700 02/22/20 0800  BP:   109/65 107/62  Pulse: 77 75 73 69  Resp: 20 (!) 21 19 (!) 22  Temp:      TempSrc:      SpO2: 91% 100% 100% 100%  Weight:      Height:       Weight change: -1.8 kg  Intake/Output Summary (Last 24 hours) at 02/22/2020 0804 Last data filed at 02/22/2020 0700 Gross per 24 hour  Intake 4470.16 ml  Output 5879 ml  Net -1408.84 ml       Labs: Basic Metabolic Panel: Recent Labs  Lab 02/21/20 0420 02/21/20 0625 02/21/20 1502 02/22/20 0415  NA 132* 137 133* 129*  K 4.3 4.3 4.0 4.1  CL 101  --  101 97*  CO2 22  --  22 23  GLUCOSE 169*  --  188* 215*  BUN 34*  --  41* 43*  CREATININE 1.16  --  1.14 1.16  CALCIUM 8.3*  --  8.0* 7.9*  PHOS 2.7  --  2.4*  2.5 2.3*   Liver Function Tests: Recent Labs  Lab 02/16/20 0310 02/16/20 1742 02/21/20 0420 02/21/20 1502 02/22/20 0415  AST 57*  --   --   --   --   ALT 51*  --   --   --   --   ALKPHOS 261*  --   --   --   --   BILITOT 1.2  --   --   --   --   PROT 5.8*  --   --   --   --  ALBUMIN 1.8*   < > 1.7* 1.7* 1.8*   < > = values in this interval not displayed.   No results for input(s): LIPASE, AMYLASE in the last 168 hours. No results for input(s): AMMONIA in the last 168 hours. CBC: Recent Labs  Lab 02/08/2020 1510 02/10/2020 1826 02/19/20 0426 02/19/20 0433 02/20/20 0310 02/20/20 0319 02/21/20 0420 02/21/20 0625 02/21/20 1502 02/22/20 0415  WBC 31.7*  --  24.6*  --  22.5*  --  16.2*  --   --  16.3*  NEUTROABS  --   --  21.6*  --  20.1*  --  14.5*  --   --  14.4*  HGB 7.5*   < > 8.6*   < > 8.3*   < > 7.9* 9.2* 8.0* 8.0*  HCT 25.6*   < > 28.4*   < > 27.0*   < > 25.2* 27.0* 26.8* 25.9*  MCV 92.8  --  88.5  --  87.9  --  88.1  --   --  90.2  PLT 502*  --  382  --  380  --  329  --   --  315   < > = values in this interval not displayed.   Cardiac Enzymes: No results for input(s): CKTOTAL, CKMB, CKMBINDEX, TROPONINI in the last 168  hours. CBG: Recent Labs  Lab 02/21/20 1349 02/21/20 1500 02/21/20 2124 02/22/20 0008 02/22/20 0437  GLUCAP 161* 181* 240* 192* 278*    Iron Studies: No results for input(s): IRON, TIBC, TRANSFERRIN, FERRITIN in the last 72 hours. Studies/Results: DG CHEST PORT 1 VIEW  Result Date: 02/22/2020 CLINICAL DATA:  Tracheostomy.  Chest tube. EXAM: PORTABLE CHEST 1 VIEW COMPARISON:  02/20/2020. FINDINGS: Tracheostomy tube, right PICC line, mediastinal drainage catheter, right chest wall drain, bilateral chest tubes in stable position. No pneumothorax. Cardiac pacer and stable position. Stable cardiomegaly. Diffuse bilateral interstitial prominence consistent with interstitial edema and or pneumonitis. Similar findings on prior exam. Stable right apical pleural based density. Small right pleural effusion. No pneumothorax. IMPRESSION: 1. Lines and tubes including bilateral chest tubes in stable position. No pneumothorax. 2. Cardiac pacer stable position.  Stable cardiomegaly. 3. Diffuse bilateral interstitial prominence consistent with interstitial edema and or pneumonitis. Similar findings noted on prior exam. Stable right apical pleural based density. Small right pleural effusion. Electronically Signed   By: Marcello Moores  Register   On: 02/22/2020 06:32    Medications: Infusions: .  prismasol BGK 4/2.5 500 mL/hr at 02/22/20 0522  .  prismasol BGK 4/2.5 200 mL/hr at 02/21/20 2027  . sodium chloride 1,000 mL (02/21/20 2200)  . sodium chloride    . amiodarone 30 mg/hr (02/22/20 0700)  . anidulafungin Stopped (02/21/20 2302)  . dexmedetomidine (PRECEDEX) IV infusion 1 mcg/kg/hr (02/22/20 0700)  . dextrose    . DOBUTamine 5 mcg/kg/min (02/22/20 0700)  . electrolyte-A Stopped (02/03/20 6659)  . epinephrine Stopped (02/19/20 1600)  . feeding supplement (PIVOT 1.5 CAL) 1,000 mL (02/22/20 0405)  . heparin 1,600 Units/hr (02/22/20 0700)  . HYDROmorphone 2 mg/hr (02/22/20 0700)  . insulin Stopped (02/21/20  1504)  . lactated ringers 10 mL/hr at 02/22/20 0700  . meropenem (MERREM) IV Stopped (02/22/20 0548)  . norepinephrine (LEVOPHED) Adult infusion Stopped (02/21/20 0157)  . prismasol BGK 4/2.5 1,500 mL/hr at 02/22/20 0513  . vancomycin Stopped (02/21/20 1701)  . vasopressin 0.02 Units/min (02/22/20 0700)    Scheduled Medications: . sodium chloride   Intravenous Once  . vitamin C  500 mg  Per Tube TID  . aspirin  324 mg Per Tube Daily  . atorvastatin  80 mg Per Tube Daily  . busPIRone  10 mg Per Tube TID  . chlorhexidine gluconate (MEDLINE KIT)  15 mL Mouth Rinse BID  . Chlorhexidine Gluconate Cloth  6 each Topical Daily  . darbepoetin (ARANESP) injection - NON-DIALYSIS  100 mcg Subcutaneous Q Sat-1800  . docusate  100 mg Per Tube BID  . feeding supplement (PROSource TF)  45 mL Per Tube TID  . fiber  1 packet Per Tube BID  . FLUoxetine  40 mg Per Tube BID  . hydrocortisone sod succinate (SOLU-CORTEF) inj  50 mg Intravenous Q8H  . HYDROmorphone  4 mg Per Tube Q6H  . insulin aspart  2 Units Subcutaneous Q4H  . insulin aspart  2-6 Units Subcutaneous Q4H  . insulin detemir  8 Units Subcutaneous Q12H  . LORazepam  1 mg Intravenous Q4H  . mouth rinse  15 mL Mouth Rinse 10 times per day  . melatonin  5 mg Per Tube Daily  . mexiletine  150 mg Per Tube Q8H  . midodrine  20 mg Per Tube TID WC  . multivitamin  1 tablet Per Tube QHS  . nutrition supplement (JUVEN)  1 packet Per Tube BID BM  . pantoprazole (PROTONIX) IV  40 mg Intravenous Q12H  . polyethylene glycol  17 g Per Tube Daily  . sodium chloride flush  10-40 mL Intracatheter Q12H  . thiamine  100 mg Per Tube Daily  . valproic acid  250 mg Per Tube BID    have reviewed scheduled and prn medications.  Physical Exam: General: Critically ill looking male sedated and on vent via trach.  Heart:RRR, s1s2 nl Lungs: Coarse breath sound bilateral Abdomen:soft, Non-tender, non-distended Extremities:1+ generalized edema Dialysis Access:   right femoral catheter site clean.  Justin Mend 02/22/2020,8:04 AM  LOS: 54 days

## 2020-02-22 NOTE — Progress Notes (Signed)
Corey Palmer for Heparin Indication: Afib  Allergies  Allergen Reactions  . Orange Fruit Anaphylaxis, Hives and Other (See Comments)    Per Pt- Blisters around lips and Hives all over, also  . Penicillins Anaphylaxis    Did it involve swelling of the face/tongue/throat, SOB, or low BP? Yes Did it involve sudden or severe rash/hives, skin peeling, or any reaction on the inside of your mouth or nose? Yes Did you need to seek medical attention at a hospital or doctor's office? No When did it last happen?childhood If all above answers are "NO", may proceed with cephalosporin use.  Corey Palmer [Empagliflozin] Itching  . Basaglar Corey Palmer [Insulin Glargine] Nausea And Vomiting    Patient Measurements: Height: 6\' 3"  (190.5 cm) Weight: 93.4 kg (205 lb 14.6 oz) IBW/kg (Calculated) : 84.5 Heparin dosing wt: 101 kg  Vital Signs: BP: 110/68 (01/25 1200) Pulse Rate: 76 (01/25 1200)  Labs: Recent Labs    02/20/20 0310 02/20/20 0319 02/21/20 0420 02/21/20 0625 02/21/20 1201 02/21/20 1502 02/21/20 2226 02/22/20 0415 02/22/20 1213  HGB 8.3*   < > 7.9* 9.2*  --  8.0*  --  8.0*  --   HCT 27.0*   < > 25.2* 27.0*  --  26.8*  --  25.9*  --   PLT 380  --  329  --   --   --   --  315  --   HEPARINUNFRC  --    < > <0.10*  --    < >  --  0.16* 0.19* 0.28*  CREATININE 1.15   < > 1.16  --   --  1.14  --  1.16  --    < > = values in this interval not displayed.    Estimated Creatinine Clearance: 90 mL/min (by C-G formula based on SCr of 1.16 mg/dL).   Assessment: 50 yo male s/p impella 5.5 placement. Pt started on heparinized purge and systemic heparin. Pt now s/p CABG 12/15 with Impella remaining in place c/b significant bleeding and hematoma requiring opened chest and reexploration in OR 12/16. Chest closed on 12/23, re-opened on 12/24. Underwent cardioversion in OR 12/28, s/p sternal wash out/application of wound VAC. Underwent sternal washout, wound VAC  change, followed by sternal closure on 1/5. Chest was re-opened on 1/5 given compromised hemodynamics. Underwent impella removal on 1/21.   Pt remains on heparin for AFib. Heparin level remains came back slightly subtherapeutic at 0.28, on 1600 units/hr. Hgb 8, plt 315. No s/sx of bleeding or infusion issues.  Goal of Therapy:  Heparin level ~0.3  Monitor platelets by anticoagulation protocol: Yes   Plan:  Increase heparin infusion to 1650 units/h Recheck heparin level in 8h Monitor daily HL, CBC, and for s/sx of bleeding   Corey Palmer, PharmD, [place] Pharmacist  Phone: [phone] 02/22/2020 1:06 PM  Please check AMION for all [place] phone numbers After 10:00 PM, call [place] [phone]

## 2020-02-22 NOTE — Progress Notes (Signed)
Pharmacy Antibiotic Note  Corey Palmer is a 50 y.o. male admitted on 01/26/2020 s/p CABG with Impella and open chest to continue on broad spectrum antibiotics for sepsis secondary to HAP.  Patient remains on CRRT and vancomycin level is therapeutic at 18 mcg/mL (goal 15-20 mcg/ml).  Afebrile/hypothermic on CRRT, WBC 16.3.  Plan: Continue vanc 1gm IV Q24H Merrem 1gm IV Q8H Eraxis 100mg  IV Q24H Monitor CRRT interruptions, clinical progress, PRN vanc levels   Height: 6\' 3"  (190.5 cm) Weight: 93.4 kg (205 lb 14.6 oz) IBW/kg (Calculated) : 84.5  Temp (24hrs), Avg:99.1 F (37.3 C), Min:99.1 F (37.3 C), Max:99.1 F (37.3 C)  Recent Labs  Lab 01/31/2020 1510 02/01/2020 1704 02/27/2020 2346 02/19/20 0426 02/19/20 1640 02/20/20 0310 02/20/20 1144 02/20/20 1600 02/21/20 0420 02/21/20 1502 02/22/20 0415 02/22/20 1411 02/22/20 1531  WBC 31.7*  --   --  24.6*  --  22.5*  --   --  16.2*  --  16.3*  --   --   CREATININE 1.68* 1.76*   < > 1.46*   < > 1.15   < > 1.11 1.16 1.14 1.16 1.17  --   LATICACIDVEN  --  3.1*  --   --   --  1.3  --   --   --   --   --   --   --   VANCOTROUGH  --   --   --   --   --   --   --   --   --   --   --   --  18   < > = values in this interval not displayed.    Estimated Creatinine Clearance: 89.3 mL/min (by C-G formula based on SCr of 1.17 mg/dL).    Allergies  Allergen Reactions  . Orange Fruit Anaphylaxis, Hives and Other (See Comments)    Per Pt- Blisters around lips and Hives all over, also  . Penicillins Anaphylaxis    Did it involve swelling of the face/tongue/throat, SOB, or low BP? Yes Did it involve sudden or severe rash/hives, skin peeling, or any reaction on the inside of your mouth or nose? Yes Did you need to seek medical attention at a hospital or doctor's office? No When did it last happen?childhood If all above answers are "NO", may proceed with cephalosporin use.  [name] [Empagliflozin] Itching  . [name] [Insulin  Glargine] Nausea And Vomiting    Vanc 12/15 >> 12/31, restart 1/4>1/12, restart 1/21>> Meropenem 12/16 >>12/31, restart 1/4 >1/12, restart 1/21> Eraxis 1/22>>  Nystatin 12/22 for oral thrush>12/30  1/25 VT = 18 mcg/mL on 1g q24 >> no change (CRRT)  12/14 Bcx neg 12/16 Bcx neg MRSA PCR neg  12/20 TA rare yeast  12/23 sternal wound neg 1/4 Bcx: neg 1/4 TA - rare candida albicans 1/9 TA - rare yeast- reincubated 1/19 BCx: ngtd  1/21 TA: no orgs  1/21 Impella graft cx: rare staph epidermidis   1/21 BCx: ngtd 1/23 fungal cx: ngtd   Kahliyah Dick D. Mina Marble, PharmD, BCPS, [place] 02/22/2020, 4:57 PM

## 2020-02-22 NOTE — Progress Notes (Signed)
Pharmacy Antibiotic Note  Corey Palmer is a 50 y.o. male admitted on 01/08/2020 s/p CABG with impella and open chest.    WBC 16.3, afebrile. Scr 1.16 (on CRRT). No growth on cultures. Pressor support has decreased since weekend.   Plan: Vancomycin 1gm q24h - will get level today  Meropenem 1g IV every 8 hrs Eraxis 100 mg IV every 24 hours  Monitor CRRT, WBC, cx results, clinical pic  Height: 6\' 3"  (190.5 cm) Weight: 93.4 kg (205 lb 14.6 oz) IBW/kg (Calculated) : 84.5  Temp (24hrs), Avg:98.2 F (36.8 C), Min:97.4 F (36.3 C), Max:99.1 F (37.3 C)  Recent Labs  Lab 02/14/2020 1510 02/01/2020 1704 02/05/2020 2346 02/19/20 0426 02/19/20 1640 02/20/20 0310 02/20/20 1144 02/20/20 1600 02/21/20 0420 02/21/20 1502 02/22/20 0415  WBC 31.7*  --   --  24.6*  --  22.5*  --   --  16.2*  --  16.3*  CREATININE 1.68* 1.76*   < > 1.46*   < > 1.15 1.20 1.11 1.16 1.14 1.16  LATICACIDVEN  --  3.1*  --   --   --  1.3  --   --   --   --   --    < > = values in this interval not displayed.    Estimated Creatinine Clearance: 90 mL/min (by C-G formula based on SCr of 1.16 mg/dL).    Allergies  Allergen Reactions  . Orange Fruit Anaphylaxis, Hives and Other (See Comments)    Per Pt- Blisters around lips and Hives all over, also  . Penicillins Anaphylaxis    Did it involve swelling of the face/tongue/throat, SOB, or low BP? Yes Did it involve sudden or severe rash/hives, skin peeling, or any reaction on the inside of your mouth or nose? Yes Did you need to seek medical attention at a hospital or doctor's office? No When did it last happen?childhood If all above answers are "NO", may proceed with cephalosporin use.  [name] [Empagliflozin] Itching  . [name] [Insulin Glargine] Nausea And Vomiting    Antimicrobials this admission: Vanc 12/15 >> 12/31, restart 1/4>1/12, restart 1/21>> Meropenem 12/16 >>12/31, restart 1/4 >1/12, restart 1/21> Eraxis 1/22>>  Nystatin 12/22 for  oral thrush>12/30  Dose adjustments this admission: N/A   Microbiology results: 12/14 Bcx neg 12/16 Bcx neg MRSA PCR neg  12/20 TA rare yeast  12/23 sternal wound neg 1/4 Bcx: neg 1/4 TA - rare candida albicans 1/9 TA - rare yeast- reincubated 1/19 BCx: ngtd  1/21 TA: no orgs  1/21 Impella graft cx: rare staph epidermidis  1/21 BCx: ngtd 1/23 fungal cx: ngtd   [name], PharmD, [place] Clinical Pharmacist  Phone: [phone] 02/22/2020 8:24 AM  Please check AMION for all [place] phone numbers After 10:00 PM, call [place] [phone]

## 2020-02-23 ENCOUNTER — Inpatient Hospital Stay (HOSPITAL_COMMUNITY): Payer: BC Managed Care – PPO

## 2020-02-23 DIAGNOSIS — Z9889 Other specified postprocedural states: Secondary | ICD-10-CM | POA: Diagnosis not present

## 2020-02-23 DIAGNOSIS — N179 Acute kidney failure, unspecified: Secondary | ICD-10-CM | POA: Diagnosis not present

## 2020-02-23 DIAGNOSIS — J9601 Acute respiratory failure with hypoxia: Secondary | ICD-10-CM | POA: Diagnosis not present

## 2020-02-23 DIAGNOSIS — I4891 Unspecified atrial fibrillation: Secondary | ICD-10-CM | POA: Diagnosis not present

## 2020-02-23 DIAGNOSIS — R570 Cardiogenic shock: Secondary | ICD-10-CM | POA: Diagnosis not present

## 2020-02-23 DIAGNOSIS — Z515 Encounter for palliative care: Secondary | ICD-10-CM | POA: Diagnosis not present

## 2020-02-23 DIAGNOSIS — Z9911 Dependence on respirator [ventilator] status: Secondary | ICD-10-CM | POA: Diagnosis not present

## 2020-02-23 LAB — HEPARIN LEVEL (UNFRACTIONATED): Heparin Unfractionated: 0.29 IU/mL — ABNORMAL LOW (ref 0.30–0.70)

## 2020-02-23 LAB — POCT I-STAT 7, (LYTES, BLD GAS, ICA,H+H)
Acid-Base Excess: 1 mmol/L (ref 0.0–2.0)
Bicarbonate: 25.7 mmol/L (ref 20.0–28.0)
Calcium, Ion: 1.16 mmol/L (ref 1.15–1.40)
HCT: 28 % — ABNORMAL LOW (ref 39.0–52.0)
Hemoglobin: 9.5 g/dL — ABNORMAL LOW (ref 13.0–17.0)
O2 Saturation: 99 %
Patient temperature: 97.7
Potassium: 4 mmol/L (ref 3.5–5.1)
Sodium: 138 mmol/L (ref 135–145)
TCO2: 27 mmol/L (ref 22–32)
pCO2 arterial: 41.3 mmHg (ref 32.0–48.0)
pH, Arterial: 7.4 (ref 7.350–7.450)
pO2, Arterial: 131 mmHg — ABNORMAL HIGH (ref 83.0–108.0)

## 2020-02-23 LAB — RENAL FUNCTION PANEL
Albumin: 1.9 g/dL — ABNORMAL LOW (ref 3.5–5.0)
Albumin: 1.9 g/dL — ABNORMAL LOW (ref 3.5–5.0)
Anion gap: 9 (ref 5–15)
Anion gap: 7 (ref 5–15)
BUN: 45 mg/dL — ABNORMAL HIGH (ref 6–20)
BUN: 45 mg/dL — ABNORMAL HIGH (ref 6–20)
CO2: 23 mmol/L (ref 22–32)
CO2: 25 mmol/L (ref 22–32)
Calcium: 8.1 mg/dL — ABNORMAL LOW (ref 8.9–10.3)
Calcium: 8 mg/dL — ABNORMAL LOW (ref 8.9–10.3)
Chloride: 102 mmol/L (ref 98–111)
Chloride: 104 mmol/L (ref 98–111)
Creatinine, Ser: 1.04 mg/dL (ref 0.61–1.24)
Creatinine, Ser: 1.03 mg/dL (ref 0.61–1.24)
GFR, Estimated: >60 mL/min (ref >60)
GFR, Estimated: >60 mL/min (ref >60)
Glucose, Bld: 195 mg/dL — ABNORMAL HIGH (ref 70–99)
Glucose, Bld: 186 mg/dL — ABNORMAL HIGH (ref 70–99)
Phosphorus: 2.2 mg/dL — ABNORMAL LOW (ref 2.5–4.6)
Phosphorus: 2.3 mg/dL — ABNORMAL LOW (ref 2.5–4.6)
Potassium: 4.2 mmol/L (ref 3.5–5.1)
Potassium: 4 mmol/L (ref 3.5–5.1)
Sodium: 134 mmol/L — ABNORMAL LOW (ref 135–145)
Sodium: 136 mmol/L (ref 135–145)

## 2020-02-23 LAB — FUNGUS CULTURE WITH STAIN

## 2020-02-23 LAB — CBC WITH DIFFERENTIAL/PLATELET
Abs Immature Granulocytes: 0.32 10*3/uL — ABNORMAL HIGH (ref 0.00–0.07)
Basophils Absolute: 0 10*3/uL (ref 0.0–0.1)
Basophils Relative: 0 %
Eosinophils Absolute: 0 10*3/uL (ref 0.0–0.5)
Eosinophils Relative: 0 %
HCT: 28.4 % — ABNORMAL LOW (ref 39.0–52.0)
Hemoglobin: 8.6 g/dL — ABNORMAL LOW (ref 13.0–17.0)
Immature Granulocytes: 2 %
Lymphocytes Relative: 4 %
Lymphs Abs: 0.6 10*3/uL — ABNORMAL LOW (ref 0.7–4.0)
MCH: 27.5 pg (ref 26.0–34.0)
MCHC: 30.3 g/dL (ref 30.0–36.0)
MCV: 90.7 fL (ref 80.0–100.0)
Monocytes Absolute: 1.1 10*3/uL — ABNORMAL HIGH (ref 0.1–1.0)
Monocytes Relative: 7 %
Neutro Abs: 15.3 10*3/uL — ABNORMAL HIGH (ref 1.7–7.7)
Neutrophils Relative %: 87 %
Platelets: 361 10*3/uL (ref 150–400)
RBC: 3.13 MIL/uL — ABNORMAL LOW (ref 4.22–5.81)
RDW: 21.3 % — ABNORMAL HIGH (ref 11.5–15.5)
WBC: 17.4 10*3/uL — ABNORMAL HIGH (ref 4.0–10.5)
nRBC: 0.2 % (ref 0.0–0.2)

## 2020-02-23 LAB — AEROBIC/ANAEROBIC CULTURE W GRAM STAIN (SURGICAL/DEEP WOUND): Culture: NO GROWTH

## 2020-02-23 LAB — COOXEMETRY PANEL
Carboxyhemoglobin: 2 % — ABNORMAL HIGH (ref 0.5–1.5)
Methemoglobin: 0.8 % (ref 0.0–1.5)
O2 Saturation: 72.9 %
Total hemoglobin: 8.4 g/dL — ABNORMAL LOW (ref 12.0–16.0)

## 2020-02-23 LAB — GLUCOSE, CAPILLARY
Glucose-Capillary: 174 mg/dL — ABNORMAL HIGH (ref 70–99)
Glucose-Capillary: 180 mg/dL — ABNORMAL HIGH (ref 70–99)
Glucose-Capillary: 188 mg/dL — ABNORMAL HIGH (ref 70–99)
Glucose-Capillary: 191 mg/dL — ABNORMAL HIGH (ref 70–99)
Glucose-Capillary: 194 mg/dL — ABNORMAL HIGH (ref 70–99)
Glucose-Capillary: 196 mg/dL — ABNORMAL HIGH (ref 70–99)

## 2020-02-23 LAB — FUNGAL ORGANISM REFLEX

## 2020-02-23 LAB — CULTURE, BLOOD (ROUTINE X 2)
Culture: NO GROWTH
Culture: NO GROWTH

## 2020-02-23 LAB — FUNGUS CULTURE RESULT

## 2020-02-23 LAB — MAGNESIUM: Magnesium: 2.5 mg/dL — ABNORMAL HIGH (ref 1.7–2.4)

## 2020-02-23 MED ORDER — AMIODARONE HCL 200 MG PO TABS
200.0000 mg | ORAL_TABLET | Freq: Two times a day (BID) | ORAL | Status: DC
  Administered 2020-02-23 – 2020-02-25 (×5): 200 mg via ORAL
  Filled 2020-02-23 (×5): qty 1

## 2020-02-23 MED ORDER — INSULIN ASPART 100 UNIT/ML ~~LOC~~ SOLN
4.0000 [IU] | SUBCUTANEOUS | Status: DC
  Administered 2020-02-23 – 2020-03-08 (×72): 4 [IU] via SUBCUTANEOUS

## 2020-02-23 MED ORDER — HYDROCORTISONE NA SUCCINATE PF 100 MG IJ SOLR
25.0000 mg | Freq: Three times a day (TID) | INTRAMUSCULAR | Status: DC
  Administered 2020-02-23 – 2020-02-24 (×3): 25 mg via INTRAVENOUS
  Filled 2020-02-23 (×3): qty 2

## 2020-02-23 MED ORDER — CHLORHEXIDINE GLUCONATE CLOTH 2 % EX PADS
6.0000 | MEDICATED_PAD | Freq: Once | CUTANEOUS | Status: AC
  Administered 2020-02-24: 6 via TOPICAL

## 2020-02-23 MED ORDER — CHLORHEXIDINE GLUCONATE CLOTH 2 % EX PADS
6.0000 | MEDICATED_PAD | Freq: Once | CUTANEOUS | Status: AC
  Administered 2020-02-23: 6 via TOPICAL

## 2020-02-23 NOTE — Anesthesia Preprocedure Evaluation (Addendum)
Anesthesia Evaluation  Patient identified by MRN, date of birth, ID band Patient unresponsive  General Assessment Comment:Please see previous charts  S/p trach/peg 02/24/2020  Reviewed: Allergy & Precautions, H&P , NPO status , Patient's Chart, lab work & pertinent test results  History of Anesthesia Complications Negative for: history of anesthetic complications  Airway Mallampati: Trach       Dental no notable dental hx.    Pulmonary sleep apnea ,    Pulmonary exam normal        Cardiovascular hypertension, + CAD, + CABG and +CHF  Normal cardiovascular exam+ pacemaker + Cardiac Defibrillator   impella    Neuro/Psych PSYCHIATRIC DISORDERS Anxiety Depression Bipolar Disorder negative neurological ROS     GI/Hepatic Neg liver ROS, GERD  ,  Endo/Other  diabetes  Renal/GU Renal disease  negative genitourinary   Musculoskeletal negative musculoskeletal ROS (+)   Abdominal   Peds negative pediatric ROS (+)  Hematology negative hematology ROS (+)   Anesthesia Other Findings   Reproductive/Obstetrics negative OB ROS                            Anesthesia Physical  Anesthesia Plan  ASA: IV  Anesthesia Plan: General   Post-op Pain Management:    Induction: Intravenous  PONV Risk Score and Plan: 0  Airway Management Planned: Tracheostomy  Additional Equipment:   Intra-op Plan:   Post-operative Plan: Post-operative intubation/ventilation  Informed Consent:   Plan Discussed with: CRNA, Anesthesiologist and Surgeon  Anesthesia Plan Comments: (Critically ill male, who has been in the ICU greater than 4 weeks. Multiple bring backs for washouts, and sternal closure)       Anesthesia Quick Evaluation

## 2020-02-23 NOTE — Progress Notes (Signed)
Patient ID: Corey Palmer, male   DOB: 28-Jan-1971, 50 y.o.   MRN: [id number] TCTS Evening Rounds:  Hemodynamically stable on dobut 5. CI 3.3 on FloTrac.  CRRT going  BMET    Component Value Date/Time   NA 136 02/23/2020 1551   NA 138 09/08/2018 0924   K 4.0 02/23/2020 1551   CL 104 02/23/2020 1551   CO2 25 02/23/2020 1551   GLUCOSE 186 (H) 02/23/2020 1551   BUN 45 (H) 02/23/2020 1551   BUN 23 09/08/2018 0924   CREATININE 1.03 02/23/2020 1551   CREATININE 1.13 05/13/2013 1533   CALCIUM 8.0 (L) 02/23/2020 1551   GFRNONAA >60 02/23/2020 1551   GFRNONAA 79 05/13/2013 1533   GFRAA >60 03/08/2019 1515   GFRAA >89 05/13/2013 1533     Remains on vent.  Planning wound dressing change in OR by Plastic Surgery in am. Will stop heparin at 5 am.

## 2020-02-23 NOTE — Progress Notes (Signed)
NAME:  Corey Palmer, MRN:  [id number], DOB:  [birth year], LOS: 35 ADMISSION DATE:  01/03/2020, CONSULTATION DATE: 12/30/2019 REFERRING MD: Aundra Dubin CHIEF COMPLAINT: Respiratory failure post Impella  HPI/Course in hospital  50 year old man admitted 12/1 with A. fib/RVR, torsades, cardiogenic shock [EF 15] requiring Impella placement (removed 1/21), CABG x 4 c/b cardiac tamponade requiring chest reopening (now with VAC), AKI requiring on CRRT.  Past Medical History:    has a past medical history of Anginal pain ([place]), Anxiety, Arthritis, Automatic implantable cardioverter-defibrillator in situ (2007), Bipolar disorder ([place]), CAD (coronary artery disease), Cancer ([place]) (2013), CHF (congestive heart failure) ([place]), Chondrocalcinosis of right knee (0/94/7096), Chronic systolic heart failure ([place]), Depression, Diabetes uncomplicated adult-type II, Dilated cardiomyopathy ([place]), Dyslipidemia, Dysrhythmia, GERD (gastroesophageal reflux disease), Gout, unspecified, Hepatic steatosis (2011), History of kidney stones, echocardiogram, Hypertension, Obesity (BMI 30.0-34.9), Pacemaker, Paroxysmal atrial fibrillation ([place]), Peptic ulcer, Shortness of breath, and Sleep apnea.  Significant Hospital Events:  12/2 Afib with RVR. Cardioverted in ED. AICD did not fire. Milrinone for low co ox.  12/7 ICD fired, Torsades- likely from low K of 2.8.  12/8 Cath- multivessel disease w/ low CO. Volume overloaded.  12/11 Underwent Impella 5.5 insertion for persistent symptoms of forward failure with low cardiac indices. 12/12 Back to the OR for displacement of Impella. Extubated.  12/15 CABG x4, with LIMA-LAD, SVG-PDA/PLV, SVG-OM 12/16 Emergently opened chest at bedside for tamponade, clot compressing right atrium  12/16 To OR for Exploration of chest due to bleeding  12/19 Started CRRT 12/23 Sternal Closure in OR 12/24 Worsening shock.  Chest reopened at bedside with improvement in hemodynamics.  No mediastinal  hematoma. 12/25 A flutter > rapid a paced to sinus rhythm.  Remains on high-dose pressors, inotropes 12/28 Weaning vasopressor requirements.  For chest washout today.  Back in atrial fibrillation, refractory to increased amiodarone and attempted overdrive pacing.  DCCV to NSR. Tolerating fluid removal via CRRT. 1/03 Appears euvolemic.  Adjusting glycemic control.  Chest still open so not weaning 1/04 Spiking fever, Vanco meropenem resumed after culture sent from blood and sputum. 1/05 Awaiting return to the OR for wound VAC dressing assessment fever curve and white blood cell count improving after antibiotics resumed the day prior, did have chest closed, developed tamponade physiology shortly after had to go back for emergent reopening of chest, return to intensive care and refractory shock 1/06 Wound VAC dressing replaced.  Still in shock.  Starting to wean sedation 1/07 Still on high-dose pressors, CRRT 1/15 Recurrent Vtach, shocked 1/18 Attempted pressure change to P 3 with addition of Dobutamine . MAP goal > 60 1/19 Ongoing attempts at impella wean, on PS 1/20 Stable on vent, following commands with sedation weaned, continued Impella wean attempt 1/21 Impella removed, on FloTrac, WBC 22K, pancultured, Cefepime/Vanc started empirically for low SVR, concern for sepsis 1/22 antifungals added Impella removed on 1/21, 3 PM CT x3 in place  Consults:  PCCM, nephrology, cardiology, cardiothoracic surgery, EP  Procedures:  R PICC 12/2 >> R axillary Impella 5.5 12/11 >> 1/21 ETT 12/15 >> 1/10 R femoral arterial sheath 12/15 >> 12/31 R femoral HD cath 12/19 >> 12/31 Chest tube - right pleural, left pleural , mediastinal 12/23 >> 1/5 R IJ HD cath 12/31 >> R Radial A-Line 1/6 >> Chest tube - L pleural, R pleural, mediastinal 1/10 >> Trach 1/10 PEG 1/10  Significant Diagnostic Tests:  12/8 LHC  Elevated R and L heart filling pressures with low CO, severe 3-vessel disease  Micro Data:  1/19  BCx2 -no growth at 3 days 1/21 OR Cx >>  Antimicrobials:  Levaquin 1/21 (periop ppx) Meropenem 1/21>> Vanc 1/21 >> Anidulafungin 1/22>>  Interval history:  Patient came off of vasopressors, continue to require dobutamine to support his heart Placed on pressure support trial, has been tolerating well so far  Objective   Blood pressure (!) 93/58, pulse 66, temperature 97.8 F (36.6 C), temperature source Axillary, resp. rate (!) 24, height 6\' 3"  (1.905 m), weight 90 kg, SpO2 100 %.    Vent Mode: PSV;CPAP FiO2 (%):  [40 %] 40 % Set Rate:  [24 bmp] 24 bmp Vt Set:  [510 mL] 510 mL PEEP:  [5 cmH20] 5 cmH20 Pressure Support:  [5 cmH20] 5 cmH20 Plateau Pressure:  [17 cmH20-24 cmH20] 24 cmH20   Intake/Output Summary (Last 24 hours) at 02/23/2020 1518 Last data filed at 02/23/2020 1500 Gross per 24 hour  Intake 4264.12 ml  Output 9910 ml  Net -5645.88 ml   Filed Weights   02/21/20 0600 02/22/20 0500 02/23/20 0457  Weight: 95.2 kg 93.4 kg 90 kg     Examination: General: Chronically ill-appearing male, s/p trach HEENT: Moist oral mucosa, tracheostomy tube in place Neuro: Lethargic but opens eyes with vocal stimuli, not following commands CV: RRR, Chest partially closed. hest tubes in place PULM: Reduced air entry at the bases, no wheezes GI: Active bowel sounds, PEG in place Extremities: Mild edema  skin: Warm, dry, no rashes.  Assessment & Plan:   Acute hypoxic and hypercapnic respiratory failure - s/p trach 1/10 Continue ventilatory support, patient has been tolerating pressure support trial If he tolerates pressure support for next few hours, will switch him back to [facility] then will try trach collar tomorrow Trend ABGs  Cardiogenic shock requiring inotropic - s/p Impella which was removed 1/21 and now not a candidate for replacement.  Also s/p CABG complicated by tamponade with multiple chest closures and emergent re-opening, most recently partially closed 1/17. Question of  sepsis due to healthcare associated pneumonia, with low SVR Continue dobutamine and midodrine Flowtrack in place Wean IV hydrocortisone Continue IV vancomycin, meropenem and antifungal for possible sepsis (though cultures have been negative) Fungitell is pending  Persistent small right-sided loculated pleural effusion Continue to monitor  Atrial fibrillation with RVR Torsades Recurrent V. tach Switch IV amiodarone to p.o., mexiletine per cards / EP / TCTS Continue to monitor and replete electrolytes as needed  Dysphagia GJ tube present On tube feeds  Acute renal failure on chronic kidney disease stage IIIa Cardiorenal syndrome On CRRT for ultrafiltration, currently 50 - 100/hr removal As he is off vasopressors, will try to see if he can tolerate IHD  Anemia Multifactorial, Blood loss and critical illness Monitor H&H  Diabetes with hyperglycemia Continue basal and bolus insulin Fingerstick goal 140-180  Profound muscular deconditioning.  Due to prolonged critical illness Physical therapy when appropriate  Daily Goals Checklist  Pain/Anxiety/Delirium protocol (if indicated): Precedex/Dilaudid gtt, weaning as able VAP protocol (if indicated): Bundle in place DVT prophylaxis:  Full dose ASA Nutrition Status: TF GI prophylaxis: Pantoprazole Glucose control: Insulin (Basal, Standing, SSI) Mobility/therapy needs: Bedrest  Code Status: Full  Family Communication: Spoke with patient's wife and sister along with palliative care team, primary team and heart failure team. Decision was made to continue current level of care, they will think about making him DNR and will get back to [place] in next 24 to 48 hours Disposition: ICU   Total critical care time: 34 minutes  Performed by: Jacky Kindle   Critical care time was exclusive of separately billable procedures and treating other patients.   Critical care was necessary to treat or prevent imminent or life-threatening  deterioration.   Critical care was time spent personally by me on the following activities: development of treatment plan with patient and/or surrogate as well as nursing, discussions with consultants, evaluation of patient's response to treatment, examination of patient, obtaining history from patient or surrogate, ordering and performing treatments and interventions, ordering and review of laboratory studies, ordering and review of radiographic studies, pulse oximetry and re-evaluation of patient's condition.   Jacky Kindle MD [facility] Pager: [phone] Mobile: [phone]

## 2020-02-23 NOTE — Progress Notes (Signed)
[name] for Heparin Indication: Afib  Allergies  Allergen Reactions  . Orange Fruit Anaphylaxis, Hives and Other (See Comments)    Per Pt- Blisters around lips and Hives all over, also  . Penicillins Anaphylaxis    Did it involve swelling of the face/tongue/throat, SOB, or low BP? Yes Did it involve sudden or severe rash/hives, skin peeling, or any reaction on the inside of your mouth or nose? Yes Did you need to seek medical attention at a hospital or doctor's office? No When did it last happen?childhood If all above answers are "NO", may proceed with cephalosporin use.  Corey Palmer [Empagliflozin] Itching  . Basaglar Kwikpen [Insulin Glargine] Nausea And Vomiting    Patient Measurements: Height: 6\' 3"  (190.5 cm) Weight: 90 kg (198 lb 6.6 oz) IBW/kg (Calculated) : 84.5 Heparin dosing wt: 101 kg  Vital Signs: Temp: 97.6 F (36.4 C) (01/26 0400) Temp Source: Oral (01/26 0400) BP: 106/63 (01/26 0600) Pulse Rate: 66 (01/26 0615)  Labs: Recent Labs    02/21/20 0420 02/21/20 0625 02/22/20 0415 02/22/20 1213 02/22/20 1411 02/22/20 2105 02/23/20 0347 02/23/20 0523  HGB 7.9*   < > 8.0*  --   --   --  8.6* 9.5*  HCT 25.2*   < > 25.9*  --   --   --  28.4* 28.0*  PLT 329  --  315  --   --   --  361  --   HEPARINUNFRC <0.10*   < > 0.19* 0.28*  --  0.25* 0.29*  --   CREATININE 1.16   < > 1.16  --  1.17  --  1.04  --    < > = values in this interval not displayed.    Estimated Creatinine Clearance: 100.4 mL/min (by C-G formula based on SCr of 1.04 mg/dL).   Assessment: 50 yo male s/p impella 5.5 placement. Pt started on heparinized purge and systemic heparin. Pt now s/p CABG 12/15 with Impella remaining in place c/b significant bleeding and hematoma requiring opened chest and reexploration in OR 12/16. Chest closed on 12/23, re-opened on 12/24. Underwent cardioversion in OR 12/28, s/p sternal wash out/application of wound VAC.  Underwent sternal washout, wound VAC change, followed by sternal closure on 1/5. Chest was re-opened on 1/5 given compromised hemodynamics. Underwent impella removal on 1/21.   Pt remains on heparin for AFib. Heparin level is 0.29, very close to goal, CBC stable.  Goal of Therapy:  Heparin level ~0.3  Monitor platelets by anticoagulation protocol: Yes   Plan:  Increase heparin to 1800 units/h Daily heparin level and CBC   Corey Palmer, PharmD, [place], [facility] Clinical Pharmacist [phone] Please check AMION for all [facility] Pharmacy numbers 02/23/2020

## 2020-02-23 NOTE — Progress Notes (Addendum)
Patient ID: Corey Palmer, male   DOB: Aug 25, 1970, 50 y.o.   MRN: [id number]     Advanced Heart Failure Rounding Note  PCP-Cardiologist: No primary care provider on file.   Subjective:    - 12/7 Torsades -ICD shock x1.  - 12/11 Impella 5.5 placed.  - 12/12 Impella repositioned in OR - 12/15 CABG x 4 with LIMA-LAD, SVG-PDA/PLV, SVG-OM - 12/16 DCCV afib.  Back to OR to re-open chest and again to evacuate hematoma.  - 12/19 CVVH initiated  - 12/23 Chest closed - 12/24 Chest reopened - 12/26 RAP back into NSR - 12/27 back in atrial fibrillation, unable to rapid atrial pace out.   - 12/28 To OR, chest partially closed and wound vac placed.  DCCV to NSR.  - 12/31 Antibiotics stopped. Swan removed, HD catheter moved from right femoral to right IJ.  - 1/5 To OR for chest closure. Chest reopened emergently at bedside again to evacuate hematoma  - 1/10 To OR: omental flap closure of chest with wound vac, tracheostomy, G-tube, J-tube.    - 1/12 Plastics and EP consulted.  - 1/13 Palliative Care consulted.  - 1/15 Went into VT, Shock 200 J and amiodarone bolus 150.   - 1/16: Milrinone stopped.    - 1/17: Partial closure of chest in OR - 1/18 Switched to dobutamine 4 mcg - 1/21 Impella Extracted.  Developed hyperkalemia when CVVH held with wide QRS rhythm and required transient external pacing.    Co-ox 73% on dobutamine 5, off pressors. Wt down, CVP 10, CRRT pulling net 50 cc/hr (tolerating).    He remains on vancomycin/meropenem/anidulafungin  FloTrac CI 4.5 SVR 444   Objective:   Weight Range: 90 kg Body mass index is 24.8 kg/m.   Vital Signs:   Temp:  [97.5 F (36.4 C)-98.8 F (37.1 C)] 97.6 F (36.4 C) (01/26 0400) Pulse Rate:  [36-89] 66 (01/26 0615) Resp:  [11-36] 21 (01/26 0615) BP: (93-112)/(56-68) 106/63 (01/26 0600) SpO2:  [100 %] 100 % (01/26 0615) Arterial Line BP: (101-137)/(46-56) 123/52 (01/26 0615) FiO2 (%):  [40 %] 40 % (01/26 0355) Weight:  [90 kg] 90 kg  (01/26 0457) Last BM Date: 02/22/20  Weight change: Filed Weights   02/21/20 0600 02/22/20 0500 02/23/20 0457  Weight: 95.2 kg 93.4 kg 90 kg    Intake/Output:   Intake/Output Summary (Last 24 hours) at 02/23/2020 0758 Last data filed at 02/23/2020 0700 Gross per 24 hour  Intake 4429.71 ml  Output 8652 ml  Net -4222.29 ml      Physical Exam   CVP 10 General: Sedated on vent.  Neck: JVP 8-9 cm, no thyromegaly or thyroid nodule.  Lungs: Decreased at bases.  CV: Nondisplaced PMI.  Heart regular S1/S2, no S3/S4, no murmur.  1+ edema to knees. Abdomen: Soft, nontender, no hepatosplenomegaly, no distention.  Skin: Intact without lesions or rashes.  Neurologic: Sedated, will wake up per nursing.  Extremities: No clubbing or cyanosis.  HEENT: Normal.    Telemetry   SR rates 70s.  Personally reviewed.      Labs    CBC Recent Labs    02/22/20 0415 02/23/20 0347 02/23/20 0523  WBC 16.3* 17.4*  --   NEUTROABS 14.4* 15.3*  --   HGB 8.0* 8.6* 9.5*  HCT 25.9* 28.4* 28.0*  MCV 90.2 90.7  --   PLT 315 361  --    Basic Metabolic Panel Recent Labs    02/22/20 0415 02/22/20 1411 02/23/20 0347 02/23/20 0523  NA 129* 136 134* 138  K 4.1 3.4* 4.2 4.0  CL 97* 104 102  --   CO2 '23 22 23  ' --   GLUCOSE 215* 202* 195*  --   BUN 43* 46* 45*  --   CREATININE 1.16 1.17 1.04  --   CALCIUM 7.9* 8.0* 8.1*  --   MG 2.4  --  2.5*  --   PHOS 2.3* 2.1* 2.2*  --    Liver Function Tests Recent Labs    02/22/20 1411 02/23/20 0347  ALBUMIN 1.9* 1.9*   No results for input(s): LIPASE, AMYLASE in the last 72 hours. Cardiac Enzymes No results for input(s): CKTOTAL, CKMB, CKMBINDEX, TROPONINI in the last 72 hours.  BNP: BNP (last 3 results) Recent Labs    01/11/2020 1619  BNP 577.5*    ProBNP (last 3 results) No results for input(s): PROBNP in the last 8760 hours.   D-Dimer No results for input(s): DDIMER in the last 72 hours. Hemoglobin A1C No results for input(s):  HGBA1C in the last 72 hours. Fasting Lipid Panel No results for input(s): CHOL, HDL, LDLCALC, TRIG, CHOLHDL, LDLDIRECT in the last 72 hours. Thyroid Function Tests No results for input(s): TSH, T4TOTAL, T3FREE, THYROIDAB in the last 72 hours.  Invalid input(s): FREET3  Other results:   Imaging    DG CHEST PORT 1 VIEW  Result Date: 02/23/2020 CLINICAL DATA:  Tracheostomy, chest tube EXAM: PORTABLE CHEST 1 VIEW COMPARISON:  02/22/2020 FINDINGS: Cardiac shadow remains enlarged. Defibrillator is again noted. Tracheostomy tube is noted in satisfactory position. Sternal closing units are noted. Lungs are well aerated bilaterally. Right-sided chest tube is seen without evidence of pneumothorax. A pleural based density is noted in the right apex which may represent loculated fluid. No focal infiltrate is seen. Mediastinal drain is noted as well. IMPRESSION: Postsurgical changes as described. No pneumothorax is seen. Persistent pleural based density in the right apex is seen. This may represent loculated fluid. Electronically Signed   By: Inez Catalina M.D.   On: 02/23/2020 06:43     Medications:     Scheduled Medications: . sodium chloride   Intravenous Once  . vitamin C  500 mg Per Tube TID  . aspirin  324 mg Per Tube Daily  . atorvastatin  80 mg Per Tube Daily  . busPIRone  10 mg Per Tube TID  . chlorhexidine gluconate (MEDLINE KIT)  15 mL Mouth Rinse BID  . Chlorhexidine Gluconate Cloth  6 each Topical Daily  . darbepoetin (ARANESP) injection - NON-DIALYSIS  100 mcg Subcutaneous Q Sat-1800  . docusate  100 mg Per Tube BID  . feeding supplement (PROSource TF)  45 mL Per Tube TID  . fiber  1 packet Per Tube BID  . FLUoxetine  40 mg Per Tube BID  . hydrocortisone sod succinate (SOLU-CORTEF) inj  50 mg Intravenous Q8H  . HYDROmorphone  4 mg Per Tube Q6H  . insulin aspart  0-9 Units Subcutaneous Q4H  . insulin aspart  2 Units Subcutaneous Q4H  . insulin detemir  12 Units Subcutaneous  Q12H  . LORazepam  1 mg Intravenous Q4H  . mouth rinse  15 mL Mouth Rinse 10 times per day  . melatonin  5 mg Per Tube Daily  . mexiletine  150 mg Per Tube Q8H  . midodrine  20 mg Per Tube TID WC  . multivitamin  1 tablet Per Tube QHS  . nutrition supplement (JUVEN)  1 packet Per Tube BID BM  .  pantoprazole (PROTONIX) IV  40 mg Intravenous Q12H  . polyethylene glycol  17 g Per Tube Daily  . sodium chloride flush  10-40 mL Intracatheter Q12H  . thiamine  100 mg Per Tube Daily  . valproic acid  250 mg Per Tube BID    Infusions: .  prismasol BGK 4/2.5 500 mL/hr at 02/23/20 0117  .  prismasol BGK 4/2.5 200 mL/hr at 02/22/20 1843  . sodium chloride 10 mL/hr at 02/22/20 2353  . sodium chloride    . amiodarone 30 mg/hr (02/23/20 0700)  . anidulafungin Stopped (02/22/20 2123)  . dexmedetomidine (PRECEDEX) IV infusion 1 mcg/kg/hr (02/23/20 0700)  . DOBUTamine 5 mcg/kg/min (02/23/20 0700)  . feeding supplement (PIVOT 1.5 CAL) 70 mL/hr at 02/22/20 2000  . heparin 1,800 Units/hr (02/23/20 0730)  . HYDROmorphone 2 mg/hr (02/23/20 0700)  . lactated ringers 10 mL/hr at 02/23/20 0700  . meropenem (MERREM) IV Stopped (02/23/20 0533)  . prismasol BGK 4/2.5 1,500 mL/hr at 02/23/20 0748  . vancomycin Stopped (02/22/20 1758)    PRN Medications: sodium chloride, Place/Maintain arterial line **AND** sodium chloride, artificial tears, bisacodyl **OR** bisacodyl, docusate, heparin, heparin, HYDROmorphone, lip balm, loperamide HCl, metoprolol tartrate, midazolam, polyethylene glycol, sodium chloride flush     Assessment/Plan   1. Cardiogenic shock/acute on chronic systolic CHF: Initially nonischemic cardiomyopathy.  Possible familial cardiomyopathy as both parents had cardiomyopathy and died at around 32. However, [employer] gene testing did not show any common mutation for cardiomyopathy.  However, this admission noted to have severe 3 vessel disease so suspect component of ischemic cardiomyopathy.  [employer] ICD.  Echo in 8/20 with EF 15% and mildly decreased RV function. Echo this admission with EF < 20%, moderate LV dilation, RV mildly reduced, severe LAE, no significant MR. Low output HF with markedly low EF.  He has a long history of cardiomyopathy (20 yrs), tends to minimize symptoms.  Cardiorenal syndrome with creatinine up to 2.2,  stabilized on milrinone and Impella 5.5. SCr 1.44 day of CABG. CABG 12/15.  Post-op shock, back to OR 12/16 to open chest (chest wall compartment syndrome) and again to evacuate hematoma.  TEE post-op with severe RV dysfunction.  CVVHD initiated 12/19 for volume removal in setting of rising Scr/decreased UOP. Suspect ATN. S/p chest closure 12/23. Hemodynamics much worse on 12/24 with rising pressor demands. Co-ox 36%.  Bedside echo severe biventricular dysfunction with marked septal bounce. Chest re-opened 12/24.  Was atrial paced back to NSR on 12/26 with improvement in hemodynamics but back in atrial fibrillation on 12/27 and unable to pace out, DCCV to NSR on 12/28. Went back to OR 1/5 for chest closure but several hours later required emergent re-opening of chest at bedside to evacuate hematoma. To OR again 1/10 with closure of chest with omental flap with G & J tube.  1/17 partial closure. 1/21 Impella removed.  Off CVVH post-removal, he developed hyperkalemia and wide QRS rhythm requiring transient external pacing, also with suspected septic shock (low SVR, fever, rising WBCs).  Now off pressors and on dobutamine gtt 5, co-ox 73%.  CVP 10, ongoing CVVH.  - Will continue dobutamine 5, think he will need this chronically.  - Continues on midodrine.  - Would aim for UF -50 cc/hr net via CVVH.  - Not candidate to replace Impella (at this point, not candidate for LVAD or transplant). He could stay on dobutamine long-term.  2. Atrial fibrillation: H/o PAF.  He was on dofetilide in the remote past but this  was stopped due to noncompliance.  He had an upper GI bleed from antral  ulcers in 3/12. He was seen by GI and was deemed safe to restart anticoagulation as long as he remains on a PPI.  No apparent recurrence of AF until just prior to this admission, was cardioverted back to NSR in ER but went back into atrial fibrillation.  Post-op afib, DCCV to NSR/BiV pacing am 12/16 -> back to AF. Rapid atrial pacing back to SR. Maintaining NSR.  - Stop amiodarone gtt, convert to 200 mg bid.  - Continue heparin gtt.   3. CAD:   Cath this admission with severe 3 vessel disease. CABG x 4 on 12/15.  - Continue atorvastatin, ASA.  4. Acute on chronic hypoxemic respiratory failure: Vent per CCM.  Has tracheostomy.  - CCM following, discuss weaning vent.  5. AKI on CKD stage 3: Likely ATN/cardiorenal. Anuric. On CVVH.    - Suspect he is going to be HD dependent.  - BP now stable, will need to attempt transition to iHD soon.  6. Diabetes: Insulin.  7. Hyponatremia: Resolved with CVVH.  8. Torsades/ICD shock: 12/7/212 in hospital event, in setting of severe hypokalemia and hypomagnesemia.   9. Gout: h/o gout. Complained of rt knee pain c/w previous flares - treated w/ prednisone burst (completed).  10: ID: Septic shock 1/21 with fever, low SVR, and elevated WBCs. Cultures NGTD x 2 days.  IJ catheter removed.  - On vancomycin/meropenem, anidulafungin.  - Wean hydrocortisone to 25 q8 hrs.  11. FEN: c/w TFs.  12. Anemia: Hgb 8.6 today.  Transfuse < 7.5.  13. Stage II Pressure Ulcer- sacrum: Continue to reposition every 2 hours R/L  14. Pleural Effusion: Loculated rt apical pleural effusion noted on imaging. Continue UF through CVVH.  PCCM following  15. VT: Amiodarone gtt + mexiletine.   - Transition amiodarone to po.  16. Deconditioning: Profound.   Unfortunately, I think that best case scenario at this point is going to be LTACH with HD if he is able to tolerate iHD.  Family meeting later today.   CRITICAL CARE Performed by: Loralie Champagne  Total critical care time: 40  minutes  Critical care time was exclusive of separately billable procedures and treating other patients.  Critical care was necessary to treat or prevent imminent or life-threatening deterioration.  Critical care was time spent personally by me on the following activities: development of treatment plan with patient and/or surrogate as well as nursing, discussions with consultants, evaluation of patient's response to treatment, examination of patient, obtaining history from patient or surrogate, ordering and performing treatments and interventions, ordering and review of laboratory studies, ordering and review of radiographic studies, pulse oximetry and re-evaluation of patient's condition.  Loralie Champagne 02/23/2020 7:58 AM

## 2020-02-23 NOTE — Progress Notes (Signed)
This chaplain joined [place] and in an interdisciplinary team Family Meeting.  The Pt. wife-[name] and sister-Brenda are present with the medical team. The Pt. son and daughter are virtually present.   After the meeting, the chaplain listened to [facility] as she reflected on the emotions involved in the family roles she is trying to fill at this time.  [name] is grateful for Brenda's companionship and encouragement to pursue self care. [name] continues to receive strength from her faith and is hopeful the Pt. will celebrate her birthday with her on Monday.    Prayer was accepted by family. This chaplain will continue with F/U spiritual care on the Pt. Healthcare journey.

## 2020-02-23 NOTE — Progress Notes (Signed)
Patient ID: Corey Palmer, male   DOB: Dec 29, 1970, 50 y.o.   MRN: [id number]  This NP visited patient at the bedside as a follow up to last week's [place], for palliative medicine  needs and emotional support and to participate in scheduled team meeting.  Met patient' wife and patient's sister ( along with three children on phone) in the conference room, and we were joined with Dr. Aundra Dubin , Dr. [name], and Dr. Tacy Learn for a thorough medical update from all specialties.  Patient's wife and family's questions were addressed in detail.  Family expressed appreciation for today's meeting.    I raised awareness/education to the seriousness of the patient's medical conditions.  Education offered on the limitations of medical interventions to prolong quality of life when the body fails to thrive.  Detailed best case scenario versus worst-case scenario.  Made family aware that anything could happen at any time and the importance of  continued conversation with all family members  and the  medical providers regarding overall plan of care and treatment options,  ensuring decisions are within the context of the patients values and GOCs.  Questions and concerns addressed  Emotional support offered     Discussed with nursing   PMT will continue to support holistically   Total time spent on the unit was 35  minutes  Greater than 50% of the time was spent in counseling and coordination of care  Wadie Lessen NP  Palliative Medicine Team Team Phone # [phone] Pager [phone]

## 2020-02-23 NOTE — Plan of Care (Signed)
  Problem: Cardiac: Goal: Ability to achieve and maintain adequate cardiopulmonary perfusion will improve Outcome: Progressing   Problem: Education: Goal: Knowledge of General Education information will improve Description: Including pain rating scale, medication(s)/side effects and non-pharmacologic comfort measures Outcome: Progressing   Problem: Health Behavior/Discharge Planning: Goal: Ability to manage health-related needs will improve Outcome: Progressing   Problem: Clinical Measurements: Goal: Ability to maintain clinical measurements within normal limits will improve Outcome: Progressing Goal: Diagnostic test results will improve Outcome: Progressing Goal: Respiratory complications will improve Outcome: Progressing Goal: Cardiovascular complication will be avoided Outcome: Progressing   Problem: Activity: Goal: Risk for activity intolerance will decrease Outcome: Progressing   Problem: Coping: Goal: Level of anxiety will decrease Outcome: Progressing   Problem: Skin Integrity: Goal: Risk for impaired skin integrity will decrease Outcome: Progressing   Problem: Education: Goal: Ability to demonstrate management of disease process will improve Outcome: Progressing Goal: Ability to verbalize understanding of medication therapies will improve Outcome: Progressing   Problem: Activity: Goal: Capacity to carry out activities will improve Outcome: Progressing   Problem: Cardiac: Goal: Ability to achieve and maintain adequate cardiopulmonary perfusion will improve Outcome: Progressing   Problem: Education: Goal: Ability to verbalize understanding of medication therapies will improve Outcome: Progressing   Problem: Activity: Goal: Capacity to carry out activities will improve Outcome: Progressing   Problem: Cardiac: Goal: Ability to achieve and maintain adequate cardiopulmonary perfusion will improve Outcome: Progressing   Problem: Education: Goal: Will  demonstrate proper wound care and an understanding of methods to prevent future damage Outcome: Progressing Goal: Knowledge of disease or condition will improve Outcome: Progressing Goal: Knowledge of the prescribed therapeutic regimen will improve Outcome: Progressing Goal: Individualized Educational Video(s) Outcome: Progressing   Problem: Activity: Goal: Risk for activity intolerance will decrease Outcome: Progressing   Problem: Cardiac: Goal: Will achieve and/or maintain hemodynamic stability Outcome: Progressing   Problem: Clinical Measurements: Goal: Postoperative complications will be avoided or minimized Outcome: Progressing   Problem: Respiratory: Goal: Respiratory status will improve Outcome: Progressing   Problem: Skin Integrity: Goal: Wound healing without signs and symptoms of infection Outcome: Progressing Goal: Risk for impaired skin integrity will decrease Outcome: Progressing   Problem: Urinary Elimination: Goal: Ability to achieve and maintain adequate renal perfusion and functioning will improve Outcome: Progressing

## 2020-02-23 NOTE — Progress Notes (Signed)
Corey Palmer  Assessment/ Plan: Pt is a 50 y.o. yo Corey Palmer  with familial cardiomyopathy EF20% s/p redo CABG 51/70/01 complicated by persistent shock, multiple attempts at sternal closure with formation of chest hematoma and tamponade, Afib with RVR, and AKI requiring CRRT.    # Anuric AKI/CKD stage III- presumably due to ischemic ATN in setting of cardiogenic shock s/p CABG.  CRRT started 01/16/20.  All fluids 4K/2.5Ca.  RIJ on 01/28/20. Continue CRRT, no prescription change, UF is net negative as tolerated- cont this rate.  Hyperkalemia improved.  The line was changed to right femoral catheter.  Corey Palmer is on inotropic support and vasopressin currently.  I'm not sure Corey Palmer would tolerate HD at this point, particularly in light of his significant fluid removal needs due to obligatory intakes with meds, etc.   Will continue CRRT for now.  # CAD s/p CABG complicated by cardiogenic shock and post-op hematoma. secondary closure on 12/23 then had to be re opened. Went to the OR for washout on 12/28, bedside washout 02/01/19.  OR 02/01/2020 for closure, then had emergent reopening at the bedside 02/28/2020 d/t anterior chest hematoma again. Plans for OR again later this week.  # Cardiogenic shock- underlying familial cardiomyopathy s/p redo CABG, EF ~20%. Remains on pressors, inotrope. Midodrine 20 tid.  The Impella was removed on 1/21.  # ABLA- s/p postoperative bleeding into chest cavity s/p re-exploration for tamponade/hematoma. Received multiple blood products  # Atrial fibrillation - s/p DCCV on 12/16. On amiodarone.   # VDRF- per CHF/PCCMsvc. Trach/peg 1/10  #-Leukocytosis/  Fever on 02/09/2020: per primary, started on broad-spectrum antibiotics.  Appears cultures are NGTD.  #Hypophosphatemia: Repleted IV phosphate previously, ok this AM.  Subjective: Seen and examined in ICU.  Running CRRT.  On vent.  Remains critically ill without any new event.  I/Os 4.4 / 8.6.    Noted family meeting, palliative discussion. Discussed with RN.   Objective Vital signs in last 24 hours: Vitals:   02/23/20 0700 02/23/20 0800 02/23/20 0900 02/23/20 1000  BP: 101/64 108/66 110/68 102/62  Pulse: 66 67 72 74  Resp: 19 (!) _0 Temp:      TempSrc:      SpO2: 100% 100% 100% 100%  Weight:      Height:       Weight change: -3.4 kg  Intake/Output Summary (Last 24 hours) at 02/23/2020 1056 Last data filed at 02/23/2020 1000 Gross per 24 hour  Intake 4257.7 ml  Output 8664 ml  Net -4406.3 ml       Labs: Basic Metabolic Panel: Recent Labs  Lab 02/22/20 0415 02/22/20 1411 02/23/20 0347 02/23/20 0523  NA 129* 136 134* 138  K 4.1 3.4* 4.2 4.0  CL 97* 104 102  --   CO2 _1 --   GLUCOSE 215* 202* 195*  --   BUN 43* 46* 45*  --   CREATININE 1.16 1.17 1.04  --   CALCIUM 7.9* 8.0* 8.1*  --   PHOS 2.3* 2.1* 2.2*  --    Liver Function Tests: Recent Labs  Lab 02/22/20 0415 02/22/20 1411 02/23/20 0347  ALBUMIN 1.8* 1.9* 1.9*   No results for input(s): LIPASE, AMYLASE in the last 168 hours. No results for input(s): AMMONIA in the last 168 hours. CBC: Recent Labs  Lab 02/19/20 0426 02/19/20 7494 02/20/20 0310 02/20/20 0319 02/21/20 0420 02/21/20 0625 02/22/20 0415 02/23/20 0347 02/23/20 0523  WBC 24.6*  --  22.5*  --  16.2*  --  16.3* 17.4*  --   NEUTROABS 21.6*  --  20.1*  --  14.5*  --  14.4* 15.3*  --   HGB 8.6*   < > 8.3*   < > 7.9*   < > 8.0* 8.6* 9.5*  HCT 28.4*   < > 27.0*   < > 25.2*   < > 25.9* 28.4* 28.0*  MCV 88.5  --  87.9  --  88.1  --  90.2 90.7  --   PLT 382  --  380  --  329  --  315 361  --    < > = values in this interval not displayed.   Cardiac Enzymes: No results for input(s): CKTOTAL, CKMB, CKMBINDEX, TROPONINI in the last 168 hours. CBG: Recent Labs  Lab 02/22/20 1627 02/22/20 1947 02/22/20 2341 02/23/20 0358 02/23/20 0739  GLUCAP 181* 192* 209* 188* 180*    Iron Studies: No results for input(s):  IRON, TIBC, TRANSFERRIN, FERRITIN in the last 72 hours. Studies/Results: DG CHEST PORT 1 VIEW  Result Date: 02/23/2020 CLINICAL DATA:  Tracheostomy, chest tube EXAM: PORTABLE CHEST 1 VIEW COMPARISON:  02/22/2020 FINDINGS: Cardiac shadow remains enlarged. Defibrillator is again noted. Tracheostomy tube is noted in satisfactory position. Sternal closing units are noted. Lungs are well aerated bilaterally. Right-sided chest tube is seen without evidence of pneumothorax. A pleural based density is noted in the right apex which may represent loculated fluid. No focal infiltrate is seen. Mediastinal drain is noted as well. IMPRESSION: Postsurgical changes as described. No pneumothorax is seen. Persistent pleural based density in the right apex is seen. This may represent loculated fluid. Electronically Signed   By: Inez Catalina M.D.   On: 02/23/2020 06:43   DG CHEST PORT 1 VIEW  Result Date: 02/22/2020 CLINICAL DATA:  Tracheostomy.  Chest tube. EXAM: PORTABLE CHEST 1 VIEW COMPARISON:  02/20/2020. FINDINGS: Tracheostomy tube, right PICC line, mediastinal drainage catheter, right chest wall drain, bilateral chest tubes in stable position. No pneumothorax. Cardiac pacer and stable position. Stable cardiomegaly. Diffuse bilateral interstitial prominence consistent with interstitial edema and or pneumonitis. Similar findings on prior exam. Stable right apical pleural based density. Small right pleural effusion. No pneumothorax. IMPRESSION: 1. Lines and tubes including bilateral chest tubes in stable position. No pneumothorax. 2. Cardiac pacer stable position.  Stable cardiomegaly. 3. Diffuse bilateral interstitial prominence consistent with interstitial edema and or pneumonitis. Similar findings noted on prior exam. Stable right apical pleural based density. Small right pleural effusion. Electronically Signed   By: Marcello Moores  Register   On: 02/22/2020 06:32    Medications: Infusions: .  prismasol BGK 4/2.5 500 mL/hr  at 02/23/20 0117  .  prismasol BGK 4/2.5 200 mL/hr at 02/22/20 1843  . sodium chloride 10 mL/hr at 02/22/20 2353  . sodium chloride    . anidulafungin Stopped (02/22/20 2123)  . dexmedetomidine (PRECEDEX) IV infusion 0.8 mcg/kg/hr (02/23/20 1000)  . DOBUTamine 5 mcg/kg/min (02/23/20 1000)  . feeding supplement (PIVOT 1.5 CAL) 70 mL/hr at 02/22/20 2000  . heparin 1,800 Units/hr (02/23/20 1000)  . HYDROmorphone 2 mg/hr (02/23/20 1000)  . lactated ringers 10 mL/hr at 02/23/20 1000  . meropenem (MERREM) IV Stopped (02/23/20 0533)  . prismasol BGK 4/2.5 1,500 mL/hr at 02/23/20 0748  . vancomycin Stopped (02/22/20 1758)    Scheduled Medications: . sodium chloride   Intravenous Once  . amiodarone  200 mg Oral BID  . vitamin C  500 mg Per Tube  TID  . aspirin  324 mg Per Tube Daily  . atorvastatin  80 mg Per Tube Daily  . busPIRone  10 mg Per Tube TID  . chlorhexidine gluconate (MEDLINE KIT)  15 mL Mouth Rinse BID  . Chlorhexidine Gluconate Cloth  6 each Topical Daily  . darbepoetin (ARANESP) injection - NON-DIALYSIS  100 mcg Subcutaneous Q Sat-1800  . docusate  100 mg Per Tube BID  . feeding supplement (PROSource TF)  45 mL Per Tube TID  . fiber  1 packet Per Tube BID  . FLUoxetine  40 mg Per Tube BID  . hydrocortisone sod succinate (SOLU-CORTEF) inj  25 mg Intravenous Q8H  . HYDROmorphone  4 mg Per Tube Q6H  . insulin aspart  0-9 Units Subcutaneous Q4H  . insulin aspart  4 Units Subcutaneous Q4H  . insulin detemir  12 Units Subcutaneous Q12H  . LORazepam  1 mg Intravenous Q4H  . mouth rinse  15 mL Mouth Rinse 10 times per day  . melatonin  5 mg Per Tube Daily  . mexiletine  150 mg Per Tube Q8H  . midodrine  20 mg Per Tube TID WC  . multivitamin  1 tablet Per Tube QHS  . nutrition supplement (JUVEN)  1 packet Per Tube BID BM  . pantoprazole (PROTONIX) IV  40 mg Intravenous Q12H  . polyethylene glycol  17 g Per Tube Daily  . sodium chloride flush  10-40 mL Intracatheter Q12H  .  thiamine  100 mg Per Tube Daily  . valproic acid  250 mg Per Tube BID    have reviewed scheduled and prn medications.  Physical Exam: General: Critically ill looking Corey Palmer sedated and on vent via trach.  Heart:RRR, s1s2 nl Lungs: Coarse breath sound bilateral Abdomen:soft, Non-tender, non-distended Extremities:1+ generalized edema Dialysis Access:  right femoral catheter site clean.  Ria Comment A Mikiah Durall 02/23/2020,10:56 AM  LOS: 55 days

## 2020-02-23 NOTE — Progress Notes (Signed)
5 Days Post-Op  Subjective: Patient is sedated on vent and continuous dialysis.  Objective: Vital signs in last 24 hours: Temp:  [97.5 F (36.4 C)-98.7 F (37.1 C)] 97.8 F (36.6 C) (01/26 1200) Pulse Rate:  [36-89] 66 (01/26 1500) Resp:  [11-26] 24 (01/26 1500) BP: (93-112)/(56-69) 93/58 (01/26 1500) SpO2:  [100 %] 100 % (01/26 1500) Arterial Line BP: (101-131)/(41-56) 109/44 (01/26 1500) FiO2 (%):  [40 %] 40 % (01/26 1136) Weight:  [90 kg] 90 kg (01/26 0457) Last BM Date: 02/23/20  Intake/Output from previous day: 01/25 0701 - 01/26 0700 In: 4429.7 [I.V.:1929.5; NG/GT:1540; IV Piggyback:730.3] Out: 5885 [Drains:510; Stool:2100; Chest Tube:30] Intake/Output this shift: Total I/O In: 1318 [I.V.:548.2; Other:180; NG/GT:490; IV Piggyback:99.9] Out: 4035 [Other:4035]  General appearance: sedated, ill-appearing, on continuous dialysis, on vent Head: Normocephalic, without obvious abnormality, atraumatic Resp: on vent Chest wall: Sternal wound VAC in place with no appreciable leaks; 100 mmHG, ~ 100 cc serosanguinous fluid in canister. Abra straps are only partially in place; nurse has tried to re-secure them, but the left side keeps pulling up.   Lab Results:  CBC    Component Value Date/Time   WBC 17.4 (H) 02/23/2020 0347   RBC 3.13 (L) 02/23/2020 0347   HGB 9.5 (L) 02/23/2020 0523   HGB 16.2 03/23/2018 1535   HCT 28.0 (L) 02/23/2020 0523   HCT 47.0 03/23/2018 1535   PLT 361 02/23/2020 0347   PLT 251 03/23/2018 1535   MCV 90.7 02/23/2020 0347   MCV 82 03/23/2018 1535   MCH 27.5 02/23/2020 0347   MCHC 30.3 02/23/2020 0347   RDW 21.3 (H) 02/23/2020 0347   RDW 15.0 03/23/2018 1535   LYMPHSABS 0.6 (L) 02/23/2020 0347   LYMPHSABS 2.5 03/23/2018 1535   MONOABS 1.1 (H) 02/23/2020 0347   EOSABS 0.0 02/23/2020 0347   EOSABS 0.2 03/23/2018 1535   BASOSABS 0.0 02/23/2020 0347   BASOSABS 0.0 03/23/2018 1535   BMET Recent Labs    02/22/20 1411 02/23/20 0347  02/23/20 0523  NA 136 134* 138  K 3.4* 4.2 4.0  CL 104 102  --   CO2 22 23  --   GLUCOSE 202* 195*  --   BUN 46* 45*  --   CREATININE 1.17 1.04  --   CALCIUM 8.0* 8.1*  --    PT/INR No results for input(s): LABPROT, INR in the last 72 hours. ABG Recent Labs    02/21/20 0625 02/23/20 0523  PHART 7.401 7.400  HCO3 24.3 25.7    Studies/Results: DG CHEST PORT 1 VIEW  Result Date: 02/23/2020 CLINICAL DATA:  Tracheostomy, chest tube EXAM: PORTABLE CHEST 1 VIEW COMPARISON:  02/22/2020 FINDINGS: Cardiac shadow remains enlarged. Defibrillator is again noted. Tracheostomy tube is noted in satisfactory position. Sternal closing units are noted. Lungs are well aerated bilaterally. Right-sided chest tube is seen without evidence of pneumothorax. A pleural based density is noted in the right apex which may represent loculated fluid. No focal infiltrate is seen. Mediastinal drain is noted as well. IMPRESSION: Postsurgical changes as described. No pneumothorax is seen. Persistent pleural based density in the right apex is seen. This may represent loculated fluid. Electronically Signed   By: Inez Catalina M.D.   On: 02/23/2020 06:43   DG CHEST PORT 1 VIEW  Result Date: 02/22/2020 CLINICAL DATA:  Tracheostomy.  Chest tube. EXAM: PORTABLE CHEST 1 VIEW COMPARISON:  02/20/2020. FINDINGS: Tracheostomy tube, right PICC line, mediastinal drainage catheter, right chest wall drain, bilateral chest tubes in stable  position. No pneumothorax. Cardiac pacer and stable position. Stable cardiomegaly. Diffuse bilateral interstitial prominence consistent with interstitial edema and or pneumonitis. Similar findings on prior exam. Stable right apical pleural based density. Small right pleural effusion. No pneumothorax. IMPRESSION: 1. Lines and tubes including bilateral chest tubes in stable position. No pneumothorax. 2. Cardiac pacer stable position.  Stable cardiomegaly. 3. Diffuse bilateral interstitial prominence  consistent with interstitial edema and or pneumonitis. Similar findings noted on prior exam. Stable right apical pleural based density. Small right pleural effusion. Electronically Signed   By: Marcello Moores  Register   On: 02/22/2020 06:32    Anti-infectives: Anti-infectives (From admission, onward)   Start     Dose/Rate Route Frequency Ordered Stop   02/20/20 2000  anidulafungin (ERAXIS) 100 mg in sodium chloride 0.9 % 100 mL IVPB        100 mg 78 mL/hr over 100 Minutes Intravenous Every 24 hours 02/19/20 1425     02/19/20 1600  vancomycin (VANCOCIN) IVPB 1000 mg/200 mL premix        1,000 mg 200 mL/hr over 60 Minutes Intravenous Every 24 hours 02/19/20 1056 02/26/20 1559   02/19/20 1600  anidulafungin (ERAXIS) 200 mg in sodium chloride 0.9 % 200 mL IVPB        200 mg 78 mL/hr over 200 Minutes Intravenous  Once 02/19/20 1425 02/19/20 2045   02/19/20 1400  vancomycin (VANCOCIN) IVPB 1000 mg/200 mL premix  Status:  Discontinued        1,000 mg 200 mL/hr over 60 Minutes Intravenous Every 24 hours 02/04/2020 1312 02/08/2020 1633   02/19/20 1400  meropenem (MERREM) 1 g in sodium chloride 0.9 % 100 mL IVPB        1 g 200 mL/hr over 30 Minutes Intravenous Every 8 hours 02/19/20 1056 02/26/20 1359   02/20/2020 1800  meropenem (MERREM) 1 g in sodium chloride 0.9 % 100 mL IVPB  Status:  Discontinued        1 g 200 mL/hr over 30 Minutes Intravenous Every 12 hours 02/01/2020 1620 02/19/20 1056   02/25/2020 1400  ceFEPIme (MAXIPIME) 2 g in sodium chloride 0.9 % 100 mL IVPB  Status:  Discontinued        2 g 200 mL/hr over 30 Minutes Intravenous Every 12 hours 02/25/2020 1312 02/24/2020 1620   01/30/2020 1400  vancomycin (VANCOREADY) IVPB 2000 mg/400 mL        2,000 mg 200 mL/hr over 120 Minutes Intravenous  Once 02/05/2020 1312 02/23/2020 1827   02/02/2020 0859  vancomycin (VANCOCIN) 1,000 mg in sodium chloride 0.9 % 1,000 mL irrigation  Status:  Discontinued          As needed 01/30/2020 0859 02/01/2020 0927   02/27/2020 0800   levofloxacin (LEVAQUIN) IVPB 500 mg        500 mg 100 mL/hr over 60 Minutes Intravenous To Surgery 02/23/2020 0758 02/08/2020 0905   02/10/2020 1115  vancomycin (VANCOREADY) IVPB 1500 mg/300 mL  Status:  Discontinued        1,500 mg 150 mL/hr over 120 Minutes Intravenous NOW 02/15/2020 1103 02/17/2020 1245   02/20/2020 1400  vancomycin (VANCOCIN) IVPB 1000 mg/200 mL premix  Status:  Discontinued        1,000 mg 200 mL/hr over 60 Minutes Intravenous Every 24 hours 02/01/20 1011 02/09/20 0957   02/01/20 1400  meropenem (MERREM) 1 g in sodium chloride 0.9 % 100 mL IVPB  Status:  Discontinued        1 g 200  mL/hr over 30 Minutes Intravenous Every 8 hours 02/01/20 1011 02/09/20 0957   02/01/20 1100  vancomycin (VANCOREADY) IVPB 2000 mg/400 mL        2,000 mg 200 mL/hr over 120 Minutes Intravenous  Once 02/01/20 1011 02/01/20 1327   01/28/20 1300  vancomycin (VANCOCIN) IVPB 1000 mg/200 mL premix  Status:  Discontinued        1,000 mg 200 mL/hr over 60 Minutes Intravenous Every 24 hours 01/27/20 1430 01/28/20 0830   01/24/20 1300  vancomycin (VANCOCIN) IVPB 1000 mg/200 mL premix  Status:  Discontinued        1,000 mg 200 mL/hr over 60 Minutes Intravenous Every 24 hours 01/24/20 0846 01/27/20 1430   01/22/20 1601  vancomycin variable dose per unstable renal function (pharmacist dosing)  Status:  Discontinued         Does not apply See admin instructions 01/22/20 1601 01/24/20 0846   01/21/20 1200  vancomycin (VANCOREADY) IVPB 1250 mg/250 mL  Status:  Discontinued        1,250 mg 166.7 mL/hr over 90 Minutes Intravenous Every 24 hours 01/22/2020 1620 01/22/20 1457   01/23/2020 1800  meropenem (MERREM) 1 g in sodium chloride 0.9 % 100 mL IVPB  Status:  Discontinued        1 g 200 mL/hr over 30 Minutes Intravenous Every 8 hours 01/24/2020 1800 01/28/20 0830   01/17/2020 1348  vancomycin (VANCOCIN) 1,000 mg in sodium chloride 0.9 % 1,000 mL irrigation  Status:  Discontinued          As needed 01/14/2020 1349 01/24/2020  1611   01/19/2020 1015  vancomycin (VANCOREADY) IVPB 1500 mg/300 mL        1,500 mg 150 mL/hr over 120 Minutes Intravenous On call to O.R. 01/01/2020 0927 01/07/2020 1556   01/19/20 1700  vancomycin (VANCOREADY) IVPB 1250 mg/250 mL  Status:  Discontinued        1,250 mg 166.7 mL/hr over 90 Minutes Intravenous Every 24 hours 01/18/20 1618 01/05/2020 1620   01/16/20 1700  vancomycin (VANCOREADY) IVPB 1500 mg/300 mL  Status:  Discontinued        1,500 mg 150 mL/hr over 120 Minutes Intravenous Every 24 hours 01/16/20 1121 01/18/20 1618   01/15/20 1700  vancomycin (VANCOREADY) IVPB 750 mg/150 mL  Status:  Discontinued        750 mg 150 mL/hr over 60 Minutes Intravenous Every 12 hours 01/15/20 1107 01/16/20 1121   01/24/2020 1535  vancomycin (VANCOCIN) powder  Status:  Discontinued          As needed 01/06/2020 1536 01/19/2020 1619   01/06/2020 1400  meropenem (MERREM) 1 g in sodium chloride 0.9 % 100 mL IVPB  Status:  Discontinued        1 g 200 mL/hr over 30 Minutes Intravenous Every 8 hours 01/19/2020 1007 01/04/2020 1800   01/28/2020 1400  levofloxacin (LEVAQUIN) IVPB 500 mg  Status:  Discontinued        500 mg 100 mL/hr over 60 Minutes Intravenous To Surgery 01/05/2020 1354 01/16/2020 1619   01/20/2020 1345  vancomycin (VANCOREADY) IVPB 1500 mg/300 mL  Status:  Discontinued        1,500 mg 150 mL/hr over 120 Minutes Intravenous To Surgery 01/11/2020 1340 01/15/2020 1619   01/09/2020 1345  cefUROXime (ZINACEF) 1.5 g in sodium chloride 0.9 % 100 mL IVPB  Status:  Discontinued        1.5 g 200 mL/hr over 30 Minutes Intravenous To Surgery 01/17/2020 1340 01/07/2020  1353   01/10/2020 1345  cefUROXime (ZINACEF) 750 mg in sodium chloride 0.9 % 100 mL IVPB  Status:  Discontinued        750 mg 200 mL/hr over 30 Minutes Intravenous To Surgery 01/10/2020 1340 01/18/2020 1353   01/09/2020 1002  vancomycin (VANCOCIN) IVPB 1000 mg/200 mL premix  Status:  Discontinued        1,000 mg 200 mL/hr over 60 Minutes Intravenous Every 12 hours 01/04/2020  0948 01/15/20 1049   01/08/2020 1000  levofloxacin (LEVAQUIN) IVPB 750 mg        750 mg 100 mL/hr over 90 Minutes Intravenous Every 24 hours 01/20/2020 1512 01/24/2020 1900   01/04/2020 2130  vancomycin (VANCOCIN) IVPB 1000 mg/200 mL premix        1,000 mg 200 mL/hr over 60 Minutes Intravenous  Once 01/22/2020 1512 01/12/2020 1718   01/13/2020 1018  vancomycin (VANCOCIN) powder  Status:  Discontinued          As needed 12/30/2019 1019 01/28/2020 1511   01/06/2020 0400  vancomycin (VANCOREADY) IVPB 1500 mg/300 mL        1,500 mg 150 mL/hr over 120 Minutes Intravenous To Surgery 01/11/20 1357 01/24/2020 1100   01/16/2020 0400  levofloxacin (LEVAQUIN) IVPB 500 mg        500 mg 100 mL/hr over 60 Minutes Intravenous To Surgery 01/11/20 1357 01/04/2020 1000      Assessment/Plan: s/p Procedure(s): REMOVAL OF IMPELLA LEFT VENTRICULAR ASSIST DEVICE TRANSESOPHAGEAL ECHOCARDIOGRAM (TEE) Wound VAC in place with no appreciable leaks; not signs of erythema surrounding VAC. Plan to return to OR on Thursday (tomorrow) for adidtional debridement of sternal wound, placement of Acell, and possible additiional partial closure.  LOS: 9 days    Threasa Heads, [place] 02/23/2020

## 2020-02-24 ENCOUNTER — Inpatient Hospital Stay (HOSPITAL_COMMUNITY): Payer: BC Managed Care – PPO | Admitting: Anesthesiology

## 2020-02-24 ENCOUNTER — Encounter (HOSPITAL_COMMUNITY): Payer: Self-pay | Admitting: Cardiothoracic Surgery

## 2020-02-24 ENCOUNTER — Encounter (HOSPITAL_COMMUNITY): Admission: EM | Disposition: E | Payer: Self-pay | Source: Home / Self Care | Attending: Cardiothoracic Surgery

## 2020-02-24 DIAGNOSIS — S21109A Unspecified open wound of unspecified front wall of thorax without penetration into thoracic cavity, initial encounter: Secondary | ICD-10-CM | POA: Diagnosis not present

## 2020-02-24 DIAGNOSIS — N179 Acute kidney failure, unspecified: Secondary | ICD-10-CM | POA: Diagnosis not present

## 2020-02-24 DIAGNOSIS — R570 Cardiogenic shock: Secondary | ICD-10-CM | POA: Diagnosis not present

## 2020-02-24 DIAGNOSIS — I4891 Unspecified atrial fibrillation: Secondary | ICD-10-CM | POA: Diagnosis not present

## 2020-02-24 DIAGNOSIS — J9601 Acute respiratory failure with hypoxia: Secondary | ICD-10-CM | POA: Diagnosis not present

## 2020-02-24 HISTORY — PX: INCISION AND DRAINAGE OF WOUND: SHX1803

## 2020-02-24 HISTORY — PX: APPLICATION OF A-CELL OF EXTREMITY: SHX6303

## 2020-02-24 HISTORY — PX: APPLICATION OF WOUND VAC: SHX5189

## 2020-02-24 LAB — COOXEMETRY PANEL
Carboxyhemoglobin: 1.9 % — ABNORMAL HIGH (ref 0.5–1.5)
Methemoglobin: 0.7 % (ref 0.0–1.5)
O2 Saturation: 62.3 %
Total hemoglobin: 9.3 g/dL — ABNORMAL LOW (ref 12.0–16.0)

## 2020-02-24 LAB — CBC WITH DIFFERENTIAL/PLATELET
Abs Immature Granulocytes: 0.33 10*3/uL — ABNORMAL HIGH (ref 0.00–0.07)
Basophils Absolute: 0 10*3/uL (ref 0.0–0.1)
Basophils Relative: 0 %
Eosinophils Absolute: 0.1 10*3/uL (ref 0.0–0.5)
Eosinophils Relative: 0 %
HCT: 31.5 % — ABNORMAL LOW (ref 39.0–52.0)
Hemoglobin: 9.1 g/dL — ABNORMAL LOW (ref 13.0–17.0)
Immature Granulocytes: 2 %
Lymphocytes Relative: 7 %
Lymphs Abs: 1.1 10*3/uL (ref 0.7–4.0)
MCH: 26.5 pg (ref 26.0–34.0)
MCHC: 28.9 g/dL — ABNORMAL LOW (ref 30.0–36.0)
MCV: 91.6 fL (ref 80.0–100.0)
Monocytes Absolute: 1.2 10*3/uL — ABNORMAL HIGH (ref 0.1–1.0)
Monocytes Relative: 8 %
Neutro Abs: 13.2 10*3/uL — ABNORMAL HIGH (ref 1.7–7.7)
Neutrophils Relative %: 83 %
Platelets: 362 10*3/uL (ref 150–400)
RBC: 3.44 MIL/uL — ABNORMAL LOW (ref 4.22–5.81)
RDW: 20.8 % — ABNORMAL HIGH (ref 11.5–15.5)
WBC: 15.9 10*3/uL — ABNORMAL HIGH (ref 4.0–10.5)
nRBC: 0.2 % (ref 0.0–0.2)

## 2020-02-24 LAB — RENAL FUNCTION PANEL
Albumin: 1.9 g/dL — ABNORMAL LOW (ref 3.5–5.0)
Albumin: 2.1 g/dL — ABNORMAL LOW (ref 3.5–5.0)
Anion gap: 8 (ref 5–15)
Anion gap: 9 (ref 5–15)
BUN: 40 mg/dL — ABNORMAL HIGH (ref 6–20)
BUN: 37 mg/dL — ABNORMAL HIGH (ref 6–20)
CO2: 23 mmol/L (ref 22–32)
CO2: 24 mmol/L (ref 22–32)
Calcium: 8.1 mg/dL — ABNORMAL LOW (ref 8.9–10.3)
Calcium: 7.9 mg/dL — ABNORMAL LOW (ref 8.9–10.3)
Chloride: 104 mmol/L (ref 98–111)
Chloride: 103 mmol/L (ref 98–111)
Creatinine, Ser: 0.98 mg/dL (ref 0.61–1.24)
Creatinine, Ser: 0.94 mg/dL (ref 0.61–1.24)
GFR, Estimated: >60 mL/min (ref >60)
GFR, Estimated: >60 mL/min (ref >60)
Glucose, Bld: 77 mg/dL (ref 70–99)
Glucose, Bld: 162 mg/dL — ABNORMAL HIGH (ref 70–99)
Phosphorus: 2.4 mg/dL — ABNORMAL LOW (ref 2.5–4.6)
Phosphorus: 3.2 mg/dL (ref 2.5–4.6)
Potassium: 3.8 mmol/L (ref 3.5–5.1)
Potassium: 3.8 mmol/L (ref 3.5–5.1)
Sodium: 135 mmol/L (ref 135–145)
Sodium: 136 mmol/L (ref 135–145)

## 2020-02-24 LAB — GLUCOSE, CAPILLARY
Glucose-Capillary: 81 mg/dL (ref 70–99)
Glucose-Capillary: 74 mg/dL (ref 70–99)
Glucose-Capillary: 81 mg/dL (ref 70–99)
Glucose-Capillary: 105 mg/dL — ABNORMAL HIGH (ref 70–99)
Glucose-Capillary: 128 mg/dL — ABNORMAL HIGH (ref 70–99)
Glucose-Capillary: 153 mg/dL — ABNORMAL HIGH (ref 70–99)
Glucose-Capillary: 132 mg/dL — ABNORMAL HIGH (ref 70–99)

## 2020-02-24 LAB — FUNGITELL, SERUM: Fungitell Result: 477 pg/mL — ABNORMAL HIGH (ref <80)

## 2020-02-24 LAB — MAGNESIUM: Magnesium: 2.4 mg/dL (ref 1.7–2.4)

## 2020-02-24 LAB — HEPARIN LEVEL (UNFRACTIONATED): Heparin Unfractionated: 0.32 IU/mL (ref 0.30–0.70)

## 2020-02-24 SURGERY — IRRIGATION AND DEBRIDEMENT WOUND
Anesthesia: General | Site: Chest

## 2020-02-24 MED ORDER — POLYETHYLENE GLYCOL 3350 17 G PO PACK: 17.0000 g | PACK | Freq: Every day | ORAL | Status: DC | PRN

## 2020-02-24 MED ORDER — DOCUSATE SODIUM 50 MG/5ML PO LIQD
100.0000 mg | Freq: Two times a day (BID) | ORAL | Status: DC | PRN
  Administered 2020-03-04: 100 mg

## 2020-02-24 MED ORDER — HYDROCORTISONE NA SUCCINATE PF 100 MG IJ SOLR
25.0000 mg | Freq: Two times a day (BID) | INTRAMUSCULAR | Status: DC
  Administered 2020-02-24 – 2020-02-28 (×8): 25 mg via INTRAVENOUS
  Filled 2020-02-24 (×8): qty 2

## 2020-02-24 MED ORDER — LACTATED RINGERS IV SOLN: INTRAVENOUS | Status: DC | PRN

## 2020-02-24 MED ORDER — PROPOFOL 10 MG/ML IV BOLUS
INTRAVENOUS | Status: AC
  Filled 2020-02-24: qty 20

## 2020-02-24 MED ORDER — FENTANYL CITRATE (PF) 250 MCG/5ML IJ SOLN
INTRAMUSCULAR | Status: AC
  Filled 2020-02-24: qty 5

## 2020-02-24 MED ORDER — HEPARIN (PORCINE) 25000 UT/250ML-% IV SOLN
1750.0000 [IU]/h | INTRAVENOUS | Status: DC
  Administered 2020-02-24: 16:00:00 1500 [IU]/h via INTRAVENOUS
  Administered 2020-02-25 (×2): 1600 [IU]/h via INTRAVENOUS
  Administered 2020-02-26: 13:00:00 1700 [IU]/h via INTRAVENOUS
  Administered 2020-02-27 – 2020-02-28 (×3): 1800 [IU]/h via INTRAVENOUS
  Administered 2020-02-28 – 2020-02-29 (×3): 1750 [IU]/h via INTRAVENOUS
  Filled 2020-02-24 (×13): qty 250

## 2020-02-24 MED ORDER — 0.9 % SODIUM CHLORIDE (POUR BTL) OPTIME
TOPICAL | Status: DC | PRN
  Administered 2020-02-24: 1000 mL

## 2020-02-24 MED ORDER — MIDAZOLAM HCL 2 MG/2ML IJ SOLN
INTRAMUSCULAR | Status: DC | PRN
  Administered 2020-02-24: 2 mg via INTRAVENOUS

## 2020-02-24 MED ORDER — DEXTROSE 10 % IV SOLN: INTRAVENOUS | Status: DC | PRN

## 2020-02-24 MED ORDER — ROCURONIUM BROMIDE 10 MG/ML (PF) SYRINGE
PREFILLED_SYRINGE | INTRAVENOUS | Status: DC | PRN
  Administered 2020-02-24: 20 mg via INTRAVENOUS
  Administered 2020-02-24: 50 mg via INTRAVENOUS

## 2020-02-24 MED ORDER — MIDAZOLAM HCL 2 MG/2ML IJ SOLN
INTRAMUSCULAR | Status: AC
  Filled 2020-02-24: qty 2

## 2020-02-24 MED ORDER — HEMOSTATIC AGENTS (NO CHARGE) OPTIME
TOPICAL | Status: DC | PRN
  Administered 2020-02-24: 2 via TOPICAL

## 2020-02-24 SURGICAL SUPPLY — 41 items
AGENT HMST 10 BLLW SHRT CANN (HEMOSTASIS) ×2
CANISTER SUCT 3000ML PPV (MISCELLANEOUS) ×2 IMPLANT
CANISTER WOUND CARE 500ML ATS (WOUND CARE) ×2 IMPLANT
COVER SURGICAL LIGHT HANDLE (MISCELLANEOUS) ×2 IMPLANT
DRAPE INCISE IOBAN 66X45 STRL (DRAPES) ×1 IMPLANT
DRAPE ORTHO SPLIT 77X108 STRL (DRAPES) ×2
DRAPE SURG ORHT 6 SPLT 77X108 (DRAPES) IMPLANT
DRSG CUTIMED SORBACT 7X9 (GAUZE/BANDAGES/DRESSINGS) ×2 IMPLANT
DRSG PAD ABDOMINAL 8X10 ST (GAUZE/BANDAGES/DRESSINGS) IMPLANT
DRSG VAC ATS MED SENSATRAC (GAUZE/BANDAGES/DRESSINGS) ×1 IMPLANT
DRSG VERSA FOAM LRG 10X15 (GAUZE/BANDAGES/DRESSINGS) ×1 IMPLANT
ELECT REM PT RETURN 9FT ADLT (ELECTROSURGICAL) ×2
ELECTRODE REM PT RTRN 9FT ADLT (ELECTROSURGICAL) ×1 IMPLANT
GAUZE SPONGE 4X4 12PLY STRL (GAUZE/BANDAGES/DRESSINGS) ×2 IMPLANT
GLOVE BIO SURGEON STRL SZ 6.5 (GLOVE) ×4 IMPLANT
GLOVE BIOGEL M 6.5 STRL (GLOVE) ×2 IMPLANT
GLOVE ECLIPSE 6.5 STRL STRAW (GLOVE) ×1 IMPLANT
GLOVE SURG SS PI 6.5 STRL IVOR (GLOVE) ×2 IMPLANT
GOWN STRL REUS W/ TWL LRG LVL3 (GOWN DISPOSABLE) ×3 IMPLANT
GOWN STRL REUS W/TWL LRG LVL3 (GOWN DISPOSABLE) ×8
GRAFT MYRIAD 3 LAYER 10X10 (Graft) ×1 IMPLANT
HEMOSTAT HEMOBLAST BELLOWS (HEMOSTASIS) ×2 IMPLANT
KIT BASIN OR (CUSTOM PROCEDURE TRAY) ×2 IMPLANT
KIT TURNOVER KIT B (KITS) ×2 IMPLANT
MICROMATRIX 1000MG (Tissue) ×4 IMPLANT
NDL HYPO 25GX1X1/2 BEV (NEEDLE) ×1 IMPLANT
NEEDLE HYPO 25GX1X1/2 BEV (NEEDLE) ×2 IMPLANT
PACK GENERAL/GYN (CUSTOM PROCEDURE TRAY) ×2 IMPLANT
PACK UNIVERSAL I (CUSTOM PROCEDURE TRAY) ×2 IMPLANT
PAD ARMBOARD 7.5X6 YLW CONV (MISCELLANEOUS) ×4 IMPLANT
SOLUTION PARTIC MCRMTRX 1000MG (Tissue) IMPLANT
SPONGE DRAIN TRACH 4X4 STRL 2S (GAUZE/BANDAGES/DRESSINGS) ×5 IMPLANT
STAPLER VISISTAT 35W (STAPLE) ×2 IMPLANT
STOCKINETTE IMPERVIOUS 9X36 MD (GAUZE/BANDAGES/DRESSINGS) IMPLANT
STOCKINETTE IMPERVIOUS LG (DRAPES) IMPLANT
SURGILUBE 2OZ TUBE FLIPTOP (MISCELLANEOUS) ×2 IMPLANT
SUT MNCRL AB 3-0 PS2 27 (SUTURE) ×1 IMPLANT
SUT PDS AB 2-0 CT1 27 (SUTURE) ×2 IMPLANT
SUT VIC AB 5-0 PS2 18 (SUTURE) ×1 IMPLANT
SYR CONTROL 10ML LL (SYRINGE) ×2 IMPLANT
TOWEL GREEN STERILE (TOWEL DISPOSABLE) ×3 IMPLANT

## 2020-02-24 NOTE — Progress Notes (Signed)
PT Cancellation Note  Patient Details Name: Corey Palmer MRN: [id number] DOB: 28-Feb-1970   Cancelled Treatment:    Reason Eval/Treat Not Completed: (P) Patient at procedure or test/unavailable (Pt back to OR for sternal wound debridement and partial closure, Will f/u per POC.)   Meilech Virts J Stann Mainland 02/20/2020, 9:49 AM  Erasmo Leventhal , PTA [facility] Pager [phone] Office [phone]

## 2020-02-24 NOTE — Anesthesia Postprocedure Evaluation (Signed)
Anesthesia Post Note  Patient: Corey Palmer  Procedure(s) Performed: Sternal wound debridement (N/A Chest) APPLICATION OF A-CELL (N/A Chest) Wound vac change (N/A Chest)     Patient location during evaluation: SICU Anesthesia Type: General Level of consciousness: sedated Pain management: pain level controlled Vital Signs Assessment: post-procedure vital signs reviewed and stable Respiratory status: patient connected to tracheostomy mask oxygen Cardiovascular status: stable Postop Assessment: no apparent nausea or vomiting Anesthetic complications: no   No complications documented.  Last Vitals:  Vitals:   02/22/2020 1000 01/30/2020 1010  BP:  98/63  Pulse: 66 66  Resp: 15 12  Temp:    SpO2: 100% 100%    Last Pain:  Vitals:   02/25/2020 0800  TempSrc: Oral  PainSc:                  [name] DANIEL

## 2020-02-24 NOTE — Op Note (Addendum)
DATE OF OPERATION: 02/23/2020  LOCATION: [facility] Inpatient  PREOPERATIVE DIAGNOSIS: Sternal wound  POSTOPERATIVE DIAGNOSIS: Same  PROCEDURE:  1. Excision of sternal wound 15 cm skin and soft tissue 2. Primary closure of 2 cm superior chest wound  3. Evacuation of hematoma 4. Placement of Acell 2 gm powder and Mirad 10 x 10 cm 5. Placement of VAC  SURGEON: [employer], DO  ASSISTANT: Phoebe Sharps, PA  EBL: 5 cc  CONDITION: Stable  COMPLICATIONS: None  INDICATION: The patient, Corey Palmer, is a 50 y.o. male born on 05-02-1970, is here for treatment of a sternal wound after cardiac surgery, hematoma and complications with a sternal wound.   PROCEDURE DETAILS:  The patient was seen prior to surgery and marked.  The IV antibiotics were given. The patient was taken to the operating room and given a general anesthetic. A standard time out was performed and all information was confirmed by those in the room. SCDs were placed.   The chest was prepped and draped.  There was a large hematoma in the wound.  This was evacuated, cleaned, irrigated and hemoblast was placed.  The tissue scissors were used to excise the skin and soft tissue of the 15 cm wound.  Hemostasis was achieved with the bovie and hemoblast.  The superior aspect of the chest wound was closed with the 2-0 PDS deep followed by the 3-0 Monocryl for the skin with a vertical mattress suture.  All of the Acell was placed on the chest followed by the Mirad.  The Sorbact was applied followed by the white VAC sponge and then the black sponge.  There was an excellent seal.  The patient was allowed to wake up and taken to the ICU in stable condition at the end of the case. The family was notified at the end of the case.   The advanced practice practitioner (APP) assisted throughout the case.  The APP was essential in retraction and counter traction when needed to make the case progress smoothly.  This retraction  and assistance made it possible to see the tissue plans for the procedure.  The assistance was needed for blood control, tissue re-approximation and assisted with closure of the incision site.

## 2020-02-24 NOTE — Progress Notes (Signed)
Patient's arterial line became dampened. Blood was visualized in pressure tubing near insertion site, but unable to flush arterial line. Cuff BP appropriate with SBP > 100 and MAPs > 70. Verbal order from Dr [name] to remove arterial line and no need to replace. Dr Tacy Learn in agreement with not needing to replace arterial line. Arterial line removed at 1140, pressure held for 10 minutes. Hemostasis achieved and pressure dressing applied.  [employer] RN

## 2020-02-24 NOTE — Progress Notes (Signed)
[facility] NEPHROLOGY PROGRESS NOTE  Assessment/ Plan: Pt is a 50 y.o. yo male  with familial cardiomyopathy EF20% s/p redo CABG 38/93/73 complicated by persistent shock, multiple attempts at sternal closure with formation of chest hematoma and tamponade, Afib with RVR, and AKI requiring CRRT.    # Anuric AKI/CKD stage III- presumably due to ischemic ATN in setting of cardiogenic shock s/p CABG.  CRRT started 01/16/20.  All fluids 4K/2.5Ca.  RIJ on 01/28/20. Continue CRRT, no prescription change, UF is net negative as tolerated- cont this rate.  Hyperkalemia improved.  The line was changed to right femoral catheter.  He is on inotropic support and vasopressin currently.  I'm not sure he would tolerate HD at this point, particularly in light of his significant fluid removal needs due to obligatory intakes with meds, etc but plan is to hold CRRT next time there is a natural break (clots, filter change out, stopped for OR), hold and try iHD.  Discussed with wife this limiting factor.  # CAD s/p CABG complicated by cardiogenic shock and post-op hematoma. secondary closure on 12/23 then had to be re opened. Went to the OR for washout on 12/28, bedside washout 02/01/19.  OR 01/31/2020 for closure, then had emergent reopening at the bedside 02/20/2020 d/t anterior chest hematoma again. S/p OR plastic surgery 1/27 - excision of wound, 2cm closure.   # Cardiogenic shock- underlying familial cardiomyopathy s/p redo CABG, EF ~20%. Remains on pressors, inotrope. Midodrine 20 tid.  The Impella was removed on 1/21.  # ABLA- s/p postoperative bleeding into chest cavity s/p re-exploration for tamponade/hematoma. Received multiple blood products  # Atrial fibrillation - s/p DCCV on 12/16. On amiodarone.   # VDRF- per CHF/PCCMsvc. Trach/peg 1/10  Subjective: Seen and examined in ICU.  OR this AM with plastics for chest.  Running CRRT.  On vent.  Remains critically ill without any new event.  I/Os  4.2 / 6.8. Discussed with RN.  Discussed with wife.   Objective Vital signs in last 24 hours: Vitals:   02/15/2020 1230 02/01/2020 1245 02/13/2020 1300 02/01/2020 1315  BP: 92/64 98/65 (!) 90/57 107/69  Pulse: 71 83 77 79  Resp: '15 13 15 15  ' Temp:   (!) 96.2 F (35.7 C)   TempSrc:   Axillary   SpO2: 100% 100% 100% 95%  Weight:      Height:       Weight change: -0.5 kg  Intake/Output Summary (Last 24 hours) at 02/23/2020 1321 Last data filed at 02/20/2020 1300 Gross per 24 hour  Intake 4537.29 ml  Output 5559 ml  Net -1021.71 ml       Labs: Basic Metabolic Panel: Recent Labs  Lab 02/23/20 0347 02/23/20 0523 02/23/20 1551 02/05/2020 0322  NA 134* 138 136 135  K 4.2 4.0 4.0 3.8  CL 102  --  104 104  CO2 23  --  25 23  GLUCOSE 195*  --  186* 77  BUN 45*  --  45* 40*  CREATININE 1.04  --  1.03 0.98  CALCIUM 8.1*  --  8.0* 8.1*  PHOS 2.2*  --  2.3* 2.4*   Liver Function Tests: Recent Labs  Lab 02/23/20 0347 02/23/20 1551 02/04/2020 0322  ALBUMIN 1.9* 1.9* 1.9*   No results for input(s): LIPASE, AMYLASE in the last 168 hours. No results for input(s): AMMONIA in the last 168 hours. CBC: Recent Labs  Lab 02/20/20 0310 02/20/20 0319 02/21/20 4287 02/21/20 6811 02/22/20 0415 02/23/20 5726  02/23/20 0523 02/13/2020 0322  WBC 22.5*  --  16.2*  --  16.3* 17.4*  --  15.9*  NEUTROABS 20.1*  --  14.5*  --  14.4* 15.3*  --  13.2*  HGB 8.3*   < > 7.9*   < > 8.0* 8.6* 9.5* 9.1*  HCT 27.0*   < > 25.2*   < > 25.9* 28.4* 28.0* 31.5*  MCV 87.9  --  88.1  --  90.2 90.7  --  91.6  PLT 380  --  329  --  315 361  --  362   < > = values in this interval not displayed.   Cardiac Enzymes: No results for input(s): CKTOTAL, CKMB, CKMBINDEX, TROPONINI in the last 168 hours. CBG: Recent Labs  Lab 02/19/2020 0326 02/19/2020 0513 01/30/2020 0615 02/23/2020 0758 01/31/2020 1240  GLUCAP 81 74 81 105* 128*    Iron Studies: No results for input(s): IRON, TIBC, TRANSFERRIN, FERRITIN in the last  72 hours. Studies/Results: DG CHEST PORT 1 VIEW  Result Date: 02/23/2020 CLINICAL DATA:  Tracheostomy, chest tube EXAM: PORTABLE CHEST 1 VIEW COMPARISON:  02/22/2020 FINDINGS: Cardiac shadow remains enlarged. Defibrillator is again noted. Tracheostomy tube is noted in satisfactory position. Sternal closing units are noted. Lungs are well aerated bilaterally. Right-sided chest tube is seen without evidence of pneumothorax. A pleural based density is noted in the right apex which may represent loculated fluid. No focal infiltrate is seen. Mediastinal drain is noted as well. IMPRESSION: Postsurgical changes as described. No pneumothorax is seen. Persistent pleural based density in the right apex is seen. This may represent loculated fluid. Electronically Signed   By: Inez Catalina M.D.   On: 02/23/2020 06:43    Medications: Infusions: .  prismasol BGK 4/2.5 500 mL/hr at 02/01/2020 0732  .  prismasol BGK 4/2.5 200 mL/hr at 02/13/2020 0719  . sodium chloride 10 mL/hr at 02/09/2020 1300  . sodium chloride    . anidulafungin Stopped (02/23/20 2157)  . dexmedetomidine (PRECEDEX) IV infusion 0.7 mcg/kg/hr (02/06/2020 1300)  . dextrose 40 mL/hr at 02/12/2020 1300  . DOBUTamine 5 mcg/kg/min (02/25/2020 1300)  . feeding supplement (PIVOT 1.5 CAL) 70 mL/hr at 02/01/2020 1234  . heparin    . HYDROmorphone 2 mg/hr (02/03/2020 1300)  . lactated ringers 10 mL/hr at 02/05/2020 1300  . meropenem (MERREM) IV Stopped (02/04/2020 0556)  . prismasol BGK 4/2.5 1,500 mL/hr at 02/13/2020 0357  . vancomycin Stopped (02/23/20 1707)    Scheduled Medications: . sodium chloride   Intravenous Once  . amiodarone  200 mg Oral BID  . vitamin C  500 mg Per Tube TID  . aspirin  324 mg Per Tube Daily  . atorvastatin  80 mg Per Tube Daily  . busPIRone  10 mg Per Tube TID  . chlorhexidine gluconate (MEDLINE KIT)  15 mL Mouth Rinse BID  . Chlorhexidine Gluconate Cloth  6 each Topical Daily  . darbepoetin (ARANESP) injection - NON-DIALYSIS  100  mcg Subcutaneous Q Sat-1800  . feeding supplement (PROSource TF)  45 mL Per Tube TID  . fiber  1 packet Per Tube BID  . FLUoxetine  40 mg Per Tube BID  . hydrocortisone sod succinate (SOLU-CORTEF) inj  25 mg Intravenous Q12H  . HYDROmorphone  4 mg Per Tube Q6H  . insulin aspart  0-9 Units Subcutaneous Q4H  . insulin aspart  4 Units Subcutaneous Q4H  . insulin detemir  12 Units Subcutaneous Q12H  . LORazepam  1 mg Intravenous Q4H  .  mouth rinse  15 mL Mouth Rinse 10 times per day  . melatonin  5 mg Per Tube Daily  . mexiletine  150 mg Per Tube Q8H  . midodrine  20 mg Per Tube TID WC  . multivitamin  1 tablet Per Tube QHS  . nutrition supplement (JUVEN)  1 packet Per Tube BID BM  . pantoprazole (PROTONIX) IV  40 mg Intravenous Q12H  . sodium chloride flush  10-40 mL Intracatheter Q12H  . thiamine  100 mg Per Tube Daily  . valproic acid  250 mg Per Tube BID    have reviewed scheduled and prn medications.  Physical Exam: General: Critically ill looking male sedated and on vent via trach.  Heart:RRR, s1s2 nl Lungs: Coarse breath sound bilateral Abdomen:soft, Non-tender, non-distended Extremities:1+ generalized edema Dialysis Access:  right femoral catheter site clean.  Justin Mend 02/20/2020,1:21 PM  LOS: 56 days

## 2020-02-24 NOTE — Progress Notes (Addendum)
[place] for Heparin Indication: Afib  Allergies  Allergen Reactions  . Orange Fruit Anaphylaxis, Hives and Other (See Comments)    Per Pt- Blisters around lips and Hives all over, also  . Penicillins Anaphylaxis    Did it involve swelling of the face/tongue/throat, SOB, or low BP? Yes Did it involve sudden or severe rash/hives, skin peeling, or any reaction on the inside of your mouth or nose? Yes Did you need to seek medical attention at a hospital or doctor's office? No When did it last happen?childhood If all above answers are "NO", may proceed with cephalosporin use.  [name] [Empagliflozin] Itching  . Basaglar Kwikpen [Insulin Glargine] Nausea And Vomiting    Patient Measurements: Height: 6\' 3"  (190.5 cm) Weight: 89.5 kg (197 lb 5 oz) IBW/kg (Calculated) : 84.5 Heparin dosing wt: 101 kg  Vital Signs: Temp: 97.6 F (36.4 C) (01/27 0400) Temp Source: Oral (01/27 0400) BP: 107/80 (01/27 0700) Pulse Rate: 69 (01/27 0700)  Labs: Recent Labs    02/22/20 0415 02/22/20 1213 02/22/20 2105 02/23/20 0347 02/23/20 0523 02/23/20 1551 02/04/2020 0322  HGB 8.0*  --   --  8.6* Corey.5*  --  Corey.1*  HCT 25.Corey*  --   --  28.4* 28.0*  --  31.5*  PLT 315  --   --  361  --   --  362  HEPARINUNFRC 0.19*   < > 0.25* 0.29*  --   --  0.32  CREATININE 1.16   < >  --  1.04  --  1.03 0.98   < > = values in this interval not displayed.    Estimated Creatinine Clearance: 106.6 mL/min (by C-G formula based on SCr of 0.98 mg/dL).   Assessment: Corey Palmer with hx PAF on apixaban PTA. Pt admitted with shock and required Impella 5.5 support. CABG 12/15 with chest left open, c/b hematoma and significant bleeding. Ultimately heparin started via purge for Impella. DCCV performed 12/16, partial chest closure 1/5, Impella removed 1/21. Heparin resumed for AFib 1/22.  Heparin level therapeutic on 1800 units/h this morning prior to being paused for sternal wound  debridement and hematoma evacuation. Per plastics - ok to restart heparin postop, will begin 6h after with no bolus at a lower rate.  Goal of Therapy:  Heparin level ~0.3  Monitor platelets by anticoagulation protocol: Yes   Plan:  Restart heparin at 1700 no bolus at 1500 units/h Check heparin level in 8h   [name], PharmD, [place], [facility] Clinical Pharmacist [phone] Please check AMION for all [facility] numbers 02/04/2020

## 2020-02-24 NOTE — H&P (Signed)
Corey Palmer is an 50 y.o. male.   Chief Complaint: sternal wound HPI: The patient is a 50 yrs old wm here for treatment of his sternal wound.  He underwent excision and partial closure last week.  We return today for further debridement and attempt at closure. There was a family meeting yesterday.  Continue with care.  Past Medical History:  Diagnosis Date  . Anginal pain ([place])   . Anxiety   . Arthritis   . Automatic implantable cardioverter-defibrillator in situ 2007   [employer]   . Bipolar disorder ([place])   . CAD (coronary artery disease)    Cardiac cath (08/19/2000) - Nonobstructive, 40% stenosis of LAD,.   . Cancer [facility]) 2013   benign stomach cancer  . CHF (congestive heart failure) ([place])   . Chondrocalcinosis of right knee 06/11/2013  . Chronic systolic heart failure ([place])    2D echo (27/7824) - Systolic function severely reduced., LV EF 20-25%. Akinesis of apical and anteroseptal mycoardium.  last 2D echo (11/2008 ), LV EF 25-30%, diffuse hypokinesis, and grade 1 diastolic dysfunction.  . Depression   . Diabetes uncomplicated adult-type II    on insulin therapy, a1c 12.9 in 01/2011  . Dilated cardiomyopathy ([place])    status post ICD placement in 2008 c/b tearing at the atrial junction resulting in tamponade and urgent thoracotomy  . Dyslipidemia   . Dysrhythmia   . GERD (gastroesophageal reflux disease)   . Gout, unspecified   . Hepatic steatosis 2011   seen on ultrasound.   . History of kidney stones   . Hx of echocardiogram    Echo (04/2013):  Mild LVH, EF 20-25%, mild LAE, mild to mod RVE with mild reduced RVSF.  Marland Kitchen Hypertension   . Obesity (BMI 30.0-34.9)   . Pacemaker   . Paroxysmal atrial fibrillation (H[facility])    on chronic coumadin, goal INR 2-3. status post direct current  . Peptic ulcer   . Shortness of breath   . Sleep apnea    does not have CPAP    Past Surgical History:  Procedure Laterality Date  . APPLICATION OF A-CELL OF EXTREMITY N/A 01/29/2020    Procedure: APPLICATION OF A-CELL;  Surgeon: Wallace Going, DO;  Location: [place];  Service: Plastics;  Laterality: N/A;  . APPLICATION OF WOUND VAC N/A 01/01/2020   Procedure: APPLICATION OF WOUND VAC;  Surgeon: Prescott Gum, Collier Salina, MD;  Location: [place];  Service: Thoracic;  Laterality: N/A;  . APPLICATION OF WOUND VAC N/A 01/07/2020   Procedure: APPLICATION OF WOUND VAC;  Surgeon: [name], MD;  Location: [place] [facility];  Service: Thoracic;  Laterality: N/A;  . APPLICATION OF WOUND VAC N/A 02/12/2020   Procedure: WOUND VAC CHANGE;  Surgeon: [name], MD;  Location: [place];  Service: Thoracic;  Laterality: N/A;  . APPLICATION OF WOUND VAC N/A 02/27/2020   Procedure: APPLICATION OF WOUND VAC;  Surgeon: Wallace Going, DO;  Location: [place];  Service: Plastics;  Laterality: N/A;  . BEDSIDE EVACUATION OF HEMATOMA N/A 12/30/2019   Procedure: CHEST EXPLORATION _0 ;  Surgeon: Ivin Poot, MD;  Location: [place];  Service: Cardiovascular;  Laterality: N/A;  . BEDSIDE EVACUATION OF HEMATOMA N/A 01/15/2020   Procedure: CHEST EXPLORATION _1 ;  Surgeon: [name], MD;  Location: [place];  Service: Open Heart Surgery;  Laterality: N/A;  . BIOPSY  12/01/2019   Procedure: BIOPSY;  Surgeon: Arta Silence, MD;  Location[facility];  Service: Endoscopy;;  .  CARDIAC CATHETERIZATION    . CARDIAC PACEMAKER PLACEMENT    . CHOLECYSTECTOMY    . COLONOSCOPY    . CORONARY ARTERY BYPASS GRAFT    . CORONARY ARTERY BYPASS GRAFT N/A 01/14/2020   Procedure: CORONARY ARTERY BYPASS GRAFTING (CABG), ON PUMP, TIMES FOUR, USING LEFT INTERNAL MAMMARY ARTERY AND ENDOSCOPICALLY HARVESTED RIGHT GREATER SAPHENOUS VEIN;  Surgeon: [name], MD;  Location: [place];  Service: Open Heart Surgery;  Laterality: N/A;  . ESOPHAGOGASTRODUODENOSCOPY  04/05/2011   Procedure: ESOPHAGOGASTRODUODENOSCOPY (EGD);  Surgeon: Irene Shipper, MD;  Location: [facility];  Service: Endoscopy;  Laterality: N/A;  .  ESOPHAGOGASTRODUODENOSCOPY (EGD) WITH PROPOFOL N/A 12/01/2019   Procedure: ESOPHAGOGASTRODUODENOSCOPY (EGD) WITH PROPOFOL;  Surgeon: Arta Silence, MD;  [facility];  Service: Endoscopy;  Laterality: N/A;  . EXPLORATION POST OPERATIVE OPEN HEART N/A 01/06/2020   Procedure: RE-EXPLORATION POST OPERATIVE OPEN HEART;  Surgeon: [name], MD;  Location: [place];  Service: Open Heart Surgery;  Laterality: N/A;  . GASTROSTOMY N/A 02/09/2020   Procedure: OPEN INSERTION OF 16 FR. BOLUS GASTROSTOMY TUBE;  Surgeon: [name], MD;  Location: [place];  Service: Thoracic;  Laterality: N/A;  . GREATER OMENTAL FLAP CLOSURE N/A 02/15/2020   Procedure: GREATER OMENTAL FLAP CLOSURE OF STERNUM;  Surgeon: [name], MD;  Location: [place] [facility];  Service: Thoracic;  Laterality: N/A;  . INCISION AND DRAINAGE OF WOUND N/A 02/16/2020   Procedure: STERNAL WOUND DEBRIDEMENT WITH PARTIAL CLOSURE OF STERNAL WOUND;  Surgeon: Wallace Going, DO;  Location: [place];  Service: Plastics;  Laterality: N/A;  Total 2.5 hours, please  . JEJUNOSTOMY N/A 02/05/2020   Procedure: 71 FR JEJUNOSTOMY INSERTION ;  Surgeon: [name], MD;  [facility];  Service: Thoracic;  Laterality: N/A;  . JOINT REPLACEMENT     left knee replacement  . KNEE ARTHROSCOPY Right 06/11/2013   Procedure: RIGHT KNEE ARTHROSCOPY with chondroplasty;  Surgeon: Johnny Bridge, MD;  Location: [place];  Service: Orthopedics;  Laterality: Right;  . PLACEMENT OF IMPELLA LEFT VENTRICULAR ASSIST DEVICE N/A 01/23/2020   Procedure: PLACEMENT OF IMPELLA LEFT VENTRICULAR ASSIST DEVICE;  Surgeon: [name], MD;  Location: [place];  Service: Open Heart Surgery;  Laterality: N/A;  . PLACEMENT OF IMPELLA LEFT VENTRICULAR ASSIST DEVICE Right 01/18/2020   Procedure: AXILLARY  ARTERY PLACEMENT OF ABIOMED IMPELLA  5.5 LEFT VENTRICULAR ASSIST DEVICE;  Surgeon: [name], MD;  Location: [place];  Service: Open Heart Surgery;  Laterality:  Right;  . REMOVAL OF IMPELLA LEFT VENTRICULAR ASSIST DEVICE N/A 02/03/2020   Procedure: REMOVAL OF IMPELLA LEFT VENTRICULAR ASSIST DEVICE;  Surgeon: [name], MD;  Location: [place];  Service: Open Heart Surgery;  Laterality: N/A;  . RIGHT/LEFT HEART CATH AND CORONARY ANGIOGRAPHY N/A 01/28/2020   Procedure: RIGHT/LEFT HEART CATH AND CORONARY ANGIOGRAPHY;  Surgeon: Larey Dresser, MD;  Location: [place] [facility];  Service: Cardiovascular;  Laterality: N/A;  . STERNAL CLOSURE N/A 01/23/2020   Procedure: STERNAL CLOSURE;  Surgeon: Ivin Poot, MD;  Location: [place];  Service: Thoracic;  Laterality: N/A;  . STERNAL CLOSURE N/A 02/28/2020   Procedure: STERNAL CLOSURE;  Surgeon: [name], MD;  Location: [place];  Service: Thoracic;  Laterality: N/A;  . STERNAL WOUND DEBRIDEMENT N/A 01/19/2020   Procedure: STERNAL WASH OUT;  Surgeon: [name], MD;  Location: [place];  Service: Thoracic;  Laterality: N/A;  . STERNAL WOUND DEBRIDEMENT N/A 02/27/2020  Procedure: STERNAL WASHOUT;  Surgeon: [name], MD;  Location: [place];  Service: Thoracic;  Laterality: N/A;  . STERNAL WOUND DEBRIDEMENT N/A 02/08/2020   Procedure: STERNAL WASHOUT WITH WOUND VAC CHANGE;  Surgeon: [name], MD;  Location: [place];  Service: Thoracic;  Laterality: N/A;  . TEE WITHOUT CARDIOVERSION N/A 01/27/2020   Procedure: TRANSESOPHAGEAL ECHOCARDIOGRAM (TEE);  Surgeon: [name], MD;  Location: [place];  Service: Open Heart Surgery;  Laterality: N/A;  . TEE WITHOUT CARDIOVERSION N/A 01/19/2020   Procedure: TRANSESOPHAGEAL ECHOCARDIOGRAM (TEE);  Surgeon: [name], MD;  Location: [place];  Service: Open Heart Surgery;  Laterality: N/A;  . TEE WITHOUT CARDIOVERSION N/A 01/23/2020   Procedure: TRANSESOPHAGEAL ECHOCARDIOGRAM (TEE);  Surgeon: Prescott Gum, Collier Salina, MD;  Location: [place];  Service: Thoracic;  Laterality: N/A;  . TEE WITHOUT CARDIOVERSION N/A 02/09/2020   Procedure: TRANSESOPHAGEAL  ECHOCARDIOGRAM (TEE);  Surgeon: [name], MD;  Location: [facility] OR;  Service: Thoracic;  Laterality: N/A;  . TEE WITHOUT CARDIOVERSION N/A 01/29/2020   Procedure: TRANSESOPHAGEAL ECHOCARDIOGRAM (TEE);  Surgeon: [name], MD;  Location: [facility] OR;  Service: Thoracic;  Laterality: N/A;  . TEE WITHOUT CARDIOVERSION N/A 02/03/2020   Procedure: TRANSESOPHAGEAL ECHOCARDIOGRAM (TEE);  Surgeon: [name], MD;  Location: [place];  Service: Open Heart Surgery;  Laterality: N/A;  . TOTAL KNEE ARTHROPLASTY Left   . TRACHEOSTOMY TUBE PLACEMENT N/A 01/29/2020   Procedure: OPEN TRACHEOSTOMY USING #8 SHILEY;  Surgeon: [name], MD;  [facility];  Service: Thoracic;  Laterality: N/A;    Family History  Problem Relation Age of Onset  . Diabetes Mother   . Coronary artery disease Mother 26  . Gout Mother   . Heart disease Mother   . Heart attack Mother 23  . Diabetes Father   . Coronary artery disease Father 16       Died in a house fire   . Heart disease Father   . Heart disease Brother    Social History:  reports that he has never smoked. He has never used smokeless tobacco. He reports that he does not drink alcohol and does not use drugs.  Allergies:  Allergies  Allergen Reactions  . Orange Fruit Anaphylaxis, Hives and Other (See Comments)    Per Pt- Blisters around lips and Hives all over, also  . Penicillins Anaphylaxis    Did it involve swelling of the face/tongue/throat, SOB, or low BP? Yes Did it involve sudden or severe rash/hives, skin peeling, or any reaction on the inside of your mouth or nose? Yes Did you need to seek medical attention at a hospital or doctor's office? No When did it last happen?childhood If all above answers are "NO", may proceed with cephalosporin use.  [name] [Empagliflozin] Itching  . [name] [Insulin Glargine] Nausea And Vomiting    Medications Prior to Admission  Medication Sig Dispense Refill  . apixaban (ELIQUIS)  5 MG TABS tablet Take 1 tablet (5 mg total) by mouth 2 (two) times daily. 180 tablet 3  . aspirin 81 MG chewable tablet Chew 324 mg by mouth as needed (for chest pain).    . carvedilol (COREG) 25 MG tablet Take 1 tablet (25 mg total) by mouth 2 (two) times daily. Needs appt (Patient taking differently: Take 25 mg by mouth 2 (two) times daily. ) 180 tablet 3  . dapagliflozin propanediol (FARXIGA) 10 MG TABS tablet Take 10 mg by mouth daily before breakfast. 30  tablet 6  . dicyclomine (BENTYL) 10 MG capsule Take 10 mg by mouth 3 (three) times daily before meals.    . digoxin (LANOXIN) 0.125 MG tablet Take 1 tablet (0.125 mg total) by mouth daily. 90 tablet 3  . divalproex (DEPAKOTE ER) 500 MG 24 hr tablet Take 1 tablet (500 mg total) by mouth at bedtime. 90 tablet 3  . FLUoxetine (PROZAC) 20 MG capsule Take 4 capsules (80 mg total) by mouth daily. (Patient taking differently: Take 40 mg by mouth 2 (two) times daily. ) 120 capsule 11  . furosemide (LASIX) 20 MG tablet TAKE 2 TABLETS (40 MG TOTAL) BY MOUTH IN THE MORNING AND 1 TABLET (20 MG TOTAL) EVERY EVENING. (Patient taking differently: TAKE 2 TABLETS (40 MG TOTAL) BY MOUTH IN THE MORNING AND 1 TABLET (20 MG TOTAL) EVERY EVENING) 90 tablet 1  . insulin aspart (NOVOLOG FLEXPEN) 100 UNIT/ML FlexPen Inject 5-50 Units into the skin See admin instructions. Inject 5-50 units into the skin three times a day before meals, PER SLIDING SCALE    . magnesium oxide (MAG-OX) 400 MG tablet Take 800 mg by mouth daily.     Marland Kitchen omeprazole (PRILOSEC) 40 MG capsule Take 1 capsule (40 mg total) by mouth daily. (Patient taking differently: Take 40 mg by mouth 2 (two) times daily before a meal. ) 30 capsule 3  . ondansetron (ZOFRAN) 4 MG tablet Take 4 mg by mouth every 8 (eight) hours as needed for nausea or vomiting.    Marland Kitchen POTASSIUM PO Take 4 tablets by mouth in the morning.    . promethazine (PHENERGAN) 25 MG tablet Take 25 mg by mouth every 8 (eight) hours as needed for  nausea or vomiting.     . sacubitril-valsartan (ENTRESTO) 49-51 MG Take 1 tablet by mouth 2 (two) times daily. 60 tablet 6  . Semaglutide,0.25 or 0.5MG/DOS, (OZEMPIC, 0.25 OR 0.5 MG/DOSE,) 2 MG/1.5ML SOPN Inject 0.5 mg into the skin once a week. (Patient taking differently: Inject 0.5 mg into the skin every Sunday. ) 4.5 mL 3  . spironolactone (ALDACTONE) 25 MG tablet Take 1 tablet (25 mg total) by mouth daily. Needs appt (Patient taking differently: Take 25 mg by mouth daily. ) 90 tablet 3  . glucose blood (CONTOUR TEST) test strip 1 each by Other route 2 (two) times daily. And lancets 1/day 180 each 3  . insulin glargine (LANTUS SOLOSTAR) 100 UNIT/ML Solostar Pen Inject 40 Units into the skin every morning. 15 mL PRN    Results for orders placed or performed during the hospital encounter of 01/11/2020 (from the past 48 hour(s))  Glucose, capillary     Status: Abnormal   Collection Time: 02/22/20  8:47 AM  Result Value Ref Range   Glucose-Capillary 225 (H) 70 - 99 mg/dL    Comment: Glucose reference range applies only to samples taken after fasting for at least 8 hours.  Glucose, capillary     Status: Abnormal   Collection Time: 02/22/20 11:36 AM  Result Value Ref Range   Glucose-Capillary 217 (H) 70 - 99 mg/dL    Comment: Glucose reference range applies only to samples taken after fasting for at least 8 hours.  Heparin level (unfractionated)     Status: Abnormal   Collection Time: 02/22/20 12:13 PM  Result Value Ref Range   Heparin Unfractionated 0.28 (L) 0.30 - 0.70 IU/mL    Comment: (NOTE) If heparin results are below expected values, and patient dosage has  been confirmed, suggest follow  up testing of antithrombin III levels. Performed at [facility], [place] [address], [place], [place] [address]   Renal function panel (daily at 1600)     Status: Abnormal   Collection Time: 02/22/20  2:11 PM  Result Value Ref Range   Sodium 136 135 - 145 mmol/L   Potassium 3.4 (L) 3.5 - 5.1  mmol/L   Chloride 104 98 - 111 mmol/L   CO2 22 22 - 32 mmol/L   Glucose, Bld 202 (H) 70 - 99 mg/dL    Comment: Glucose reference range applies only to samples taken after fasting for at least 8 hours.   BUN 46 (H) 6 - 20 mg/dL   Creatinine, Ser 1.17 0.61 - 1.24 mg/dL   Calcium 8.0 (L) 8.9 - 10.3 mg/dL   Phosphorus 2.1 (L) 2.5 - 4.6 mg/dL   Albumin 1.9 (L) 3.5 - 5.0 g/dL   GFR, Estimated >60 >60 mL/min    Comment: (NOTE) Calculated using the CKD-EPI Creatinine Equation (2021)    Anion gap 10 5 - 15    Comment: Performed at [facility] [address] [place], [place] [address]  Vancomycin, trough     Status: None   Collection Time: 02/22/20  3:31 PM  Result Value Ref Range   Vancomycin Tr 18 15 - 20 ug/mL    Comment: Performed at [facility] [address], [place], [place] [address]  Glucose, capillary     Status: Abnormal   Collection Time: 02/22/20  4:27 PM  Result Value Ref Range   Glucose-Capillary 181 (H) 70 - 99 mg/dL    Comment: Glucose reference range applies only to samples taken after fasting for at least 8 hours.  Glucose, capillary     Status: Abnormal   Collection Time: 02/22/20  7:47 PM  Result Value Ref Range   Glucose-Capillary 192 (H) 70 - 99 mg/dL    Comment: Glucose reference range applies only to samples taken after fasting for at least 8 hours.  Heparin level (unfractionated)     Status: Abnormal   Collection Time: 02/22/20  9:05 PM  Result Value Ref Range   Heparin Unfractionated 0.25 (L) 0.30 - 0.70 IU/mL    Comment: (NOTE) If heparin results are below expected values, and patient dosage has  been confirmed, suggest follow up testing of antithrombin III levels. Performed at [facility], [place] [address] [place], [place] [address]   Glucose, capillary     Status: Abnormal   Collection Time: 02/22/20 11:41 PM  Result Value Ref Range   Glucose-Capillary 209 (H) 70 - 99 mg/dL    Comment: Glucose reference range applies only to  samples taken after fasting for at least 8 hours.  Magnesium     Status: Abnormal   Collection Time: 02/23/20  3:47 AM  Result Value Ref Range   Magnesium 2.5 (H) 1.7 - 2.4 mg/dL    Comment: Performed at [facility] [address], [place] [address]  CBC with Differential/Platelet     Status: Abnormal   Collection Time: 02/23/20  3:47 AM  Result Value Ref Range   WBC 17.4 (H) 4.0 - 10.5 K/uL   RBC 3.13 (L) 4.22 - 5.81 MIL/uL   Hemoglobin 8.6 (L) 13.0 - 17.0 g/dL   HCT 28.4 (L) 39.0 - 52.0 %   MCV 90.7 80.0 - 100.0 fL   MCH 27.5 26.0 - 34.0 pg   MCHC 30.3 30.0 - 36.0  g/dL   RDW 21.3 (H) 11.5 - 15.5 %   Platelets 361 150 - 400 K/uL   nRBC 0.2 0.0 - 0.2 %   Neutrophils Relative % 87 %   Neutro Abs 15.3 (H) 1.7 - 7.7 K/uL   Lymphocytes Relative 4 %   Lymphs Abs 0.6 (L) 0.7 - 4.0 K/uL   Monocytes Relative 7 %   Monocytes Absolute 1.1 (H) 0.1 - 1.0 K/uL   Eosinophils Relative 0 %   Eosinophils Absolute 0.0 0.0 - 0.5 K/uL   Basophils Relative 0 %   Basophils Absolute 0.0 0.0 - 0.1 K/uL   Immature Granulocytes 2 %   Abs Immature Granulocytes 0.32 (H) 0.00 - 0.07 K/uL    Comment: Performed at [facility] [address] [place], [place] [address]  Heparin level (unfractionated)     Status: Abnormal   Collection Time: 02/23/20  3:47 AM  Result Value Ref Range   Heparin Unfractionated 0.29 (L) 0.30 - 0.70 IU/mL    Comment: (NOTE) If heparin results are below expected values, and patient dosage has  been confirmed, suggest follow up testing of antithrombin III levels. Performed at [facility], [place] [address], [place], [place] [address]   Renal function panel     Status: Abnormal   Collection Time: 02/23/20  3:47 AM  Result Value Ref Range   Sodium 134 (L) 135 - 145 mmol/L   Potassium 4.2 3.5 - 5.1 mmol/L   Chloride 102 98 - 111 mmol/L   CO2 23 22 - 32 mmol/L   Glucose, Bld 195 (H) 70 - 99 mg/dL    Comment: Glucose reference range applies only to  samples taken after fasting for at least 8 hours.   BUN 45 (H) 6 - 20 mg/dL   Creatinine, Ser 1.04 0.61 - 1.24 mg/dL   Calcium 8.1 (L) 8.9 - 10.3 mg/dL   Phosphorus 2.2 (L) 2.5 - 4.6 mg/dL   Albumin 1.9 (L) 3.5 - 5.0 g/dL   GFR, Estimated >60 >60 mL/min    Comment: (NOTE) Calculated using the CKD-EPI Creatinine Equation (2021)    Anion gap 9 5 - 15    Comment: Performed at [facility] [address] [place], [place] [address]  Glucose, capillary     Status: Abnormal   Collection Time: 02/23/20  3:58 AM  Result Value Ref Range   Glucose-Capillary 188 (H) 70 - 99 mg/dL    Comment: Glucose reference range applies only to samples taken after fasting for at least 8 hours.  Cooxemetry Panel (carboxy, met, total hgb, O2 sat)     Status: Abnormal   Collection Time: 02/23/20  4:03 AM  Result Value Ref Range   Total hemoglobin 8.4 (L) 12.0 - 16.0 g/dL   O2 Saturation 72.9 %   Carboxyhemoglobin 2.0 (H) 0.5 - 1.5 %   Methemoglobin 0.8 0.0 - 1.5 %    Comment: Performed at [facility] [address], [place], [place] [address]  I-STAT 7, (LYTES, BLD GAS, ICA, H+H)     Status: Abnormal   Collection Time: 02/23/20  5:23 AM  Result Value Ref Range   pH, Arterial 7.400 7.350 - 7.450   pCO2 arterial 41.3 32.0 - 48.0 mmHg   pO2, Arterial 131 (H) 83.0 - 108.0 mmHg   Bicarbonate 25.7 20.0 - 28.0 mmol/L   TCO2 27 22 - 32 mmol/L   O2 Saturation 99.0 %   Acid-Base Excess 1.0 0.0 -  2.0 mmol/L   Sodium 138 135 - 145 mmol/L   Potassium 4.0 3.5 - 5.1 mmol/L   Calcium, Ion 1.16 1.15 - 1.40 mmol/L   HCT 28.0 (L) 39.0 - 52.0 %   Hemoglobin 9.5 (L) 13.0 - 17.0 g/dL   Patient temperature 97.7 F    Sample type ARTERIAL   Glucose, capillary     Status: Abnormal   Collection Time: 02/23/20  7:39 AM  Result Value Ref Range   Glucose-Capillary 180 (H) 70 - 99 mg/dL    Comment: Glucose reference range applies only to samples taken after fasting for at least 8 hours.  Glucose, capillary      Status: Abnormal   Collection Time: 02/23/20 11:59 AM  Result Value Ref Range   Glucose-Capillary 191 (H) 70 - 99 mg/dL    Comment: Glucose reference range applies only to samples taken after fasting for at least 8 hours.  Glucose, capillary     Status: Abnormal   Collection Time: 02/23/20  3:23 PM  Result Value Ref Range   Glucose-Capillary 174 (H) 70 - 99 mg/dL    Comment: Glucose reference range applies only to samples taken after fasting for at least 8 hours.  Renal function panel (daily at 1600)     Status: Abnormal   Collection Time: 02/23/20  3:51 PM  Result Value Ref Range   Sodium 136 135 - 145 mmol/L   Potassium 4.0 3.5 - 5.1 mmol/L   Chloride 104 98 - 111 mmol/L   CO2 25 22 - 32 mmol/L   Glucose, Bld 186 (H) 70 - 99 mg/dL    Comment: Glucose reference range applies only to samples taken after fasting for at least 8 hours.   BUN 45 (H) 6 - 20 mg/dL   Creatinine, Ser 1.03 0.61 - 1.24 mg/dL   Calcium 8.0 (L) 8.9 - 10.3 mg/dL   Phosphorus 2.3 (L) 2.5 - 4.6 mg/dL   Albumin 1.9 (L) 3.5 - 5.0 g/dL   GFR, Estimated >60 >60 mL/min    Comment: (NOTE) Calculated using the CKD-EPI Creatinine Equation (2021)    Anion gap 7 5 - 15    Comment: Performed at [facility] [address] [place], [place] [address]  Glucose, capillary     Status: Abnormal   Collection Time: 02/23/20  8:20 PM  Result Value Ref Range   Glucose-Capillary 194 (H) 70 - 99 mg/dL    Comment: Glucose reference range applies only to samples taken after fasting for at least 8 hours.  Glucose, capillary     Status: Abnormal   Collection Time: 02/23/20 11:58 PM  Result Value Ref Range   Glucose-Capillary 196 (H) 70 - 99 mg/dL    Comment: Glucose reference range applies only to samples taken after fasting for at least 8 hours.  Cooxemetry Panel (carboxy, met, total hgb, O2 sat)     Status: Abnormal   Collection Time: 02/22/2020  3:22 AM  Result Value Ref Range   Total hemoglobin 9.3 (L) 12.0 - 16.0 g/dL    O2 Saturation 62.3 %   Carboxyhemoglobin 1.9 (H) 0.5 - 1.5 %   Methemoglobin 0.7 0.0 - 1.5 %    Comment: Performed at [facility] [address], [place], [place] [address]  Renal function panel (daily at 0500)     Status: Abnormal   Collection Time: 02/26/2020  3:22 AM  Result Value Ref Range   Sodium 135 135 - 145 mmol/L   Potassium  3.8 3.5 - 5.1 mmol/L   Chloride 104 98 - 111 mmol/L   CO2 23 22 - 32 mmol/L   Glucose, Bld 77 70 - 99 mg/dL    Comment: Glucose reference range applies only to samples taken after fasting for at least 8 hours.   BUN 40 (H) 6 - 20 mg/dL   Creatinine, Ser 0.98 0.61 - 1.24 mg/dL   Calcium 8.1 (L) 8.9 - 10.3 mg/dL   Phosphorus 2.4 (L) 2.5 - 4.6 mg/dL   Albumin 1.9 (L) 3.5 - 5.0 g/dL   GFR, Estimated >60 >60 mL/min    Comment: (NOTE) Calculated using the CKD-EPI Creatinine Equation (2021)    Anion gap 8 5 - 15    Comment: Performed at [facility] [address] [place], [place] [address]  Magnesium     Status: None   Collection Time: 01/31/2020  3:22 AM  Result Value Ref Range   Magnesium 2.4 1.7 - 2.4 mg/dL    Comment: Performed at [facility], [place] [address] [place], [place] [address]  CBC with Differential/Platelet     Status: Abnormal   Collection Time: 02/28/2020  3:22 AM  Result Value Ref Range   WBC 15.9 (H) 4.0 - 10.5 K/uL   RBC 3.44 (L) 4.22 - 5.81 MIL/uL   Hemoglobin 9.1 (L) 13.0 - 17.0 g/dL   HCT 31.5 (L) 39.0 - 52.0 %   MCV 91.6 80.0 - 100.0 fL   MCH 26.5 26.0 - 34.0 pg   MCHC 28.9 (L) 30.0 - 36.0 g/dL   RDW 20.8 (H) 11.5 - 15.5 %   Platelets 362 150 - 400 K/uL   nRBC 0.2 0.0 - 0.2 %   Neutrophils Relative % 83 %   Neutro Abs 13.2 (H) 1.7 - 7.7 K/uL   Lymphocytes Relative 7 %   Lymphs Abs 1.1 0.7 - 4.0 K/uL   Monocytes Relative 8 %   Monocytes Absolute 1.2 (H) 0.1 - 1.0 K/uL   Eosinophils Relative 0 %   Eosinophils Absolute 0.1 0.0 - 0.5 K/uL   Basophils Relative 0 %   Basophils Absolute 0.0 0.0 - 0.1 K/uL    Immature Granulocytes 2 %   Abs Immature Granulocytes 0.33 (H) 0.00 - 0.07 K/uL    Comment: Performed at [facility] [address] [place], [place] [address]  Heparin level (unfractionated)     Status: None   Collection Time: 02/01/2020  3:22 AM  Result Value Ref Range   Heparin Unfractionated 0.32 0.30 - 0.70 IU/mL    Comment: (NOTE) If heparin results are below expected values, and patient dosage has  been confirmed, suggest follow up testing of antithrombin III levels. Performed at [facility], [place] [address], [place], [place] [address]   Glucose, capillary     Status: None   Collection Time: 02/21/2020  3:26 AM  Result Value Ref Range   Glucose-Capillary 81 70 - 99 mg/dL    Comment: Glucose reference range applies only to samples taken after fasting for at least 8 hours.  Glucose, capillary     Status: None   Collection Time: 02/05/2020  5:13 AM  Result Value Ref Range   Glucose-Capillary 74 70 - 99 mg/dL    Comment: Glucose reference range applies only to samples taken after fasting for at least 8 hours.  Glucose, capillary     Status: None   Collection Time: 02/15/2020  6:15 AM  Result Value Ref Range  Glucose-Capillary 81 70 - 99 mg/dL    Comment: Glucose reference range applies only to samples taken after fasting for at least 8 hours.  Glucose, capillary     Status: Abnormal   Collection Time: 02/03/2020  7:58 AM  Result Value Ref Range   Glucose-Capillary 105 (H) 70 - 99 mg/dL    Comment: Glucose reference range applies only to samples taken after fasting for at least 8 hours.   DG CHEST PORT 1 VIEW  Result Date: 02/23/2020 CLINICAL DATA:  Tracheostomy, chest tube EXAM: PORTABLE CHEST 1 VIEW COMPARISON:  02/22/2020 FINDINGS: Cardiac shadow remains enlarged. Defibrillator is again noted. Tracheostomy tube is noted in satisfactory position. Sternal closing units are noted. Lungs are well aerated bilaterally. Right-sided chest tube is seen without evidence of  pneumothorax. A pleural based density is noted in the right apex which may represent loculated fluid. No focal infiltrate is seen. Mediastinal drain is noted as well. IMPRESSION: Postsurgical changes as described. No pneumothorax is seen. Persistent pleural based density in the right apex is seen. This may represent loculated fluid. Electronically Signed   By: Inez Catalina M.D.   On: 02/23/2020 06:43    Review of Systems  Unable to perform ROS: Intubated    Blood pressure 107/72, pulse (!) 44, temperature 97.6 F (36.4 C), temperature source Oral, resp. rate 17, height _0  (1.905 m), weight 89.5 kg, SpO2 93 %. Physical Exam Vitals and nursing note reviewed.  HENT:     Head: Normocephalic.  Cardiovascular:     Rate and Rhythm: Normal rate.     Pulses: Normal pulses.  Pulmonary:     Comments: On Vent Skin:    Capillary Refill: Capillary refill takes less than 2 seconds.  Neurological:     Comments: Intubated and sedated      Assessment/Plan Sternal wound - plan excision and partial closure.  [place], DO 02/08/2020, 8:27 AM

## 2020-02-24 NOTE — Progress Notes (Signed)
Patient ID: Corey Palmer, male   DOB: [birth year], 50 y.o.   MRN: [id number] EVENING ROUNDS NOTE :     [facility]       [place] [address]             [phone]                 Day of Surgery Procedure(s) (LRB): Sternal wound debridement (N/A) APPLICATION OF A-CELL (N/A) Wound vac change (N/A)  Total Length of Stay:  LOS: 56 days  BP (!) 89/59   Pulse 80   Temp (!) 96.2 F (35.7 C) (Axillary)   Resp (!) 23   Ht 6\' 3"  (1.905 m)   Wt 89.5 kg   SpO2 100%   BMI 24.66 kg/m   .Intake/Output      01/26 0701 01/27 0700 01/27 0701 01/28 0700   I.V. (mL/kg) 1723.6 (19.3) 1337.4 (14.9)   Other 715 540   NG/GT 1190 220.5   IV Piggyback 630 300   Total Intake(mL/kg) 4258.6 (47.6) 2397.9 (26.8)   Urine (mL/kg/hr)     Drains 600 0   Other 5344 1295   Stool 875 600   Blood  10   Chest Tube 50    Total Output 6869 1905   Net -2610.4 +492.9        Stool Occurrence  1 x     .  prismasol BGK 4/2.5 500 mL/hr at 02/10/2020 0732  .  prismasol BGK 4/2.5 200 mL/hr at 02/28/2020 0719  . sodium chloride Stopped (02/19/2020 1731)  . sodium chloride    . anidulafungin Stopped (02/23/20 2157)  . dexmedetomidine (PRECEDEX) IV infusion 0.7 mcg/kg/hr (02/26/2020 1800)  . dextrose 40 mL/hr at 02/13/2020 1800  . DOBUTamine 5 mcg/kg/min (02/12/2020 1800)  . feeding supplement (PIVOT 1.5 CAL) 35 mL/hr at 02/03/2020 1728  . heparin 1,500 Units/hr (02/09/2020 1800)  . HYDROmorphone 2 mg/hr (02/09/2020 1800)  . lactated ringers 10 mL/hr at 02/20/2020 1800  . meropenem (MERREM) IV Stopped (02/08/2020 1500)  . prismasol BGK 4/2.5 1,500 mL/hr at 02/17/2020 1737  . vancomycin Stopped (02/10/2020 1651)     Lab Results  Component Value Date   WBC 15.9 (H) 02/18/2020   HGB 9.1 (L) 02/23/2020   HCT 31.5 (L) 02/10/2020   PLT 362 02/08/2020   GLUCOSE 162 (H) 01/30/2020   CHOL 176 10/01/2018   TRIG 126 10/01/2018   HDL 25 (L) 10/01/2018   LDLDIRECT 116.5 11/01/2011   LDLCALC 126 (H) 10/01/2018    ALT 51 (H) 02/16/2020   AST 57 (H) 02/16/2020   NA 136 02/01/2020   K 3.8 02/21/2020   CL 103 02/15/2020   CREATININE 0.94 02/16/2020   BUN 37 (H) 01/30/2020   CO2 24 02/08/2020   TSH 3.009 10/01/2018   INR 1.3 (H) 02/06/2020   HGBA1C 10.6 (H) 01/03/2020   Follows simple commands To or this ar for wound debridement/ closure    Grace Isaac MD  Beeper [phone] Office [phone] 02/27/2020 6:21 PM

## 2020-02-24 NOTE — Progress Notes (Signed)
Patient ID: Corey Palmer, male   DOB: [birth year], 50 y.o.   MRN: [id number]     Advanced Heart Failure Rounding Note  PCP-Cardiologist: No primary care provider on file.   Subjective:    - 12/7 Torsades -ICD shock x1.  - 12/11 Impella 5.5 placed.  - 12/12 Impella repositioned in OR - 12/15 CABG x 4 with LIMA-LAD, SVG-PDA/PLV, SVG-OM - 12/16 DCCV afib.  Back to OR to re-open chest and again to evacuate hematoma.  - 12/19 CVVH initiated  - 12/23 Chest closed - 12/24 Chest reopened - 12/26 RAP back into NSR - 12/27 back in atrial fibrillation, unable to rapid atrial pace out.   - 12/28 To OR, chest partially closed and wound vac placed.  DCCV to NSR.  - 12/31 Antibiotics stopped. Swan removed, HD catheter moved from right femoral to right IJ.  - 1/5 To OR for chest closure. Chest reopened emergently at bedside again to evacuate hematoma  - 1/10 To OR: omental flap closure of chest with wound vac, tracheostomy, G-tube, J-tube.    - 1/12 Plastics and EP consulted.  - 1/13 Palliative Care consulted.  - 1/15 Went into VT, Shock 200 J and amiodarone bolus 150.   - 1/16: Milrinone stopped.    - 1/17: Partial closure of chest in OR - 1/18 Switched to dobutamine 4 mcg - 1/21 Impella Extracted.  Developed hyperkalemia when CVVH held with wide QRS rhythm and required transient external pacing.    Co-ox 62% on dobutamine 5, off pressors. Wt down 1 lb, CVP 10-12, CRRT pulling net 50 cc/hr (tolerating).    He remains on vancomycin/meropenem/anidulafungin.   FloTrac CI 3.2 SVR 640   Objective:   Weight Range: 89.5 kg Body mass index is 24.66 kg/m.   Vital Signs:   Temp:  [96.2 F (35.7 C)-97.9 F (36.6 C)] 97.6 F (36.4 C) (01/27 0400) Pulse Rate:  [62-84] 69 (01/27 0700) Resp:  [13-32] 19 (01/27 0700) BP: (93-110)/(57-80) 107/80 (01/27 0700) SpO2:  [98 %-100 %] 98 % (01/27 0700) Arterial Line BP: (104-123)/(41-56) 107/53 (01/27 0700) FiO2 (%):  [40 %] 40 % (01/27 0400) Weight:   [89.5 kg] 89.5 kg (01/27 0500) Last BM Date: 02/23/20  Weight change: Filed Weights   02/22/20 0500 02/23/20 0457 02/17/2020 0500  Weight: 93.4 kg 90 kg 89.5 kg    Intake/Output:   Intake/Output Summary (Last 24 hours) at 02/01/2020 0717 Last data filed at 02/01/2020 0700 Gross per 24 hour  Intake 4258.57 ml  Output 6869 ml  Net -2610.43 ml      Physical Exam   CVP 10-12 General: NAD Neck: Tracheostomy, JVP 10 cm, no thyromegaly or thyroid nodule.  Lungs: Decreased at bases. CV: Wound vac on chest.  Heart regular S1/S2, no S3/S4, no murmur.  No peripheral edema.  Abdomen: Soft, nontender, no hepatosplenomegaly, no distention.  Skin: Intact without lesions or rashes.  Neurologic: Awake, will follow commands per nursing Extremities: No clubbing or cyanosis.  HEENT: Normal.    Telemetry   SR rates 80s.  Personally reviewed.      Labs    CBC Recent Labs    02/23/20 0347 02/23/20 0523 02/09/2020 0322  WBC 17.4*  --  15.9*  NEUTROABS 15.3*  --  13.2*  HGB 8.6* 9.5* 9.1*  HCT 28.4* 28.0* 31.5*  MCV 90.7  --  91.6  PLT 361  --  767   Basic Metabolic Panel Recent Labs    02/23/20 0347 02/23/20 0523 02/23/20 1551  02/06/2020 0322  NA 134*   < > 136 135  K 4.2   < > 4.0 3.8  CL 102  --  104 104  CO2 23  --  25 23  GLUCOSE 195*  --  186* 77  BUN 45*  --  45* 40*  CREATININE 1.04  --  1.03 0.98  CALCIUM 8.1*  --  8.0* 8.1*  MG 2.5*  --   --  2.4  PHOS 2.2*  --  2.3* 2.4*   < > = values in this interval not displayed.   Liver Function Tests Recent Labs    02/23/20 1551 02/28/2020 0322  ALBUMIN 1.9* 1.9*   No results for input(s): LIPASE, AMYLASE in the last 72 hours. Cardiac Enzymes No results for input(s): CKTOTAL, CKMB, CKMBINDEX, TROPONINI in the last 72 hours.  BNP: BNP (last 3 results) Recent Labs    01/23/2020 1619  BNP 577.5*    ProBNP (last 3 results) No results for input(s): PROBNP in the last 8760 hours.   D-Dimer No results for  input(s): DDIMER in the last 72 hours. Hemoglobin A1C No results for input(s): HGBA1C in the last 72 hours. Fasting Lipid Panel No results for input(s): CHOL, HDL, LDLCALC, TRIG, CHOLHDL, LDLDIRECT in the last 72 hours. Thyroid Function Tests No results for input(s): TSH, T4TOTAL, T3FREE, THYROIDAB in the last 72 hours.  Invalid input(s): FREET3  Other results:   Imaging    No results found.   Medications:     Scheduled Medications: . sodium chloride   Intravenous Once  . amiodarone  200 mg Oral BID  . vitamin C  500 mg Per Tube TID  . aspirin  324 mg Per Tube Daily  . atorvastatin  80 mg Per Tube Daily  . busPIRone  10 mg Per Tube TID  . chlorhexidine gluconate (MEDLINE KIT)  15 mL Mouth Rinse BID  . Chlorhexidine Gluconate Cloth  6 each Topical Daily  . darbepoetin (ARANESP) injection - NON-DIALYSIS  100 mcg Subcutaneous Q Sat-1800  . docusate  100 mg Per Tube BID  . feeding supplement (PROSource TF)  45 mL Per Tube TID  . fiber  1 packet Per Tube BID  . FLUoxetine  40 mg Per Tube BID  . hydrocortisone sod succinate (SOLU-CORTEF) inj  25 mg Intravenous Q8H  . HYDROmorphone  4 mg Per Tube Q6H  . insulin aspart  0-9 Units Subcutaneous Q4H  . insulin aspart  4 Units Subcutaneous Q4H  . insulin detemir  12 Units Subcutaneous Q12H  . LORazepam  1 mg Intravenous Q4H  . mouth rinse  15 mL Mouth Rinse 10 times per day  . melatonin  5 mg Per Tube Daily  . mexiletine  150 mg Per Tube Q8H  . midodrine  20 mg Per Tube TID WC  . multivitamin  1 tablet Per Tube QHS  . nutrition supplement (JUVEN)  1 packet Per Tube BID BM  . pantoprazole (PROTONIX) IV  40 mg Intravenous Q12H  . polyethylene glycol  17 g Per Tube Daily  . sodium chloride flush  10-40 mL Intracatheter Q12H  . thiamine  100 mg Per Tube Daily  . valproic acid  250 mg Per Tube BID    Infusions: .  prismasol BGK 4/2.5 500 mL/hr at 02/23/20 2004  .  prismasol BGK 4/2.5 200 mL/hr at 02/23/20 2111  . sodium  chloride 10 mL/hr at 02/15/2020 0700  . sodium chloride    . anidulafungin Stopped (02/23/20  2157)  . dexmedetomidine (PRECEDEX) IV infusion 0.6 mcg/kg/hr (02/17/2020 0700)  . dextrose 40 mL/hr at 02/28/2020 0700  . DOBUTamine 5 mcg/kg/min (02/05/2020 0700)  . feeding supplement (PIVOT 1.5 CAL) Stopped (02/26/2020 0000)  . heparin Stopped (02/09/2020 0506)  . HYDROmorphone 2 mg/hr (02/03/2020 0700)  . lactated ringers 10 mL/hr at 02/08/2020 0700  . meropenem (MERREM) IV Stopped (02/23/2020 0556)  . prismasol BGK 4/2.5 1,500 mL/hr at 02/03/2020 0357  . vancomycin Stopped (02/23/20 1707)    PRN Medications: sodium chloride, Place/Maintain arterial line **AND** sodium chloride, artificial tears, bisacodyl **OR** bisacodyl, dextrose, docusate, heparin, heparin, HYDROmorphone, lip balm, loperamide HCl, metoprolol tartrate, midazolam, polyethylene glycol, sodium chloride flush     Assessment/Plan   1. Cardiogenic shock/acute on chronic systolic CHF: Initially nonischemic cardiomyopathy.  Possible familial cardiomyopathy as both parents had cardiomyopathy and died at around 37. However, [employer] gene testing did not show any common mutation for cardiomyopathy.  However, this admission noted to have severe 3 vessel disease so suspect component of ischemic cardiomyopathy.  [employer] ICD.  Echo in 8/20 with EF 15% and mildly decreased RV function. Echo this admission with EF < 20%, moderate LV dilation, RV mildly reduced, severe LAE, no significant MR. Low output HF with markedly low EF.  He has a long history of cardiomyopathy (20 yrs), tends to minimize symptoms.  Cardiorenal syndrome with creatinine up to 2.2,  stabilized on milrinone and Impella 5.5. SCr 1.44 day of CABG. CABG 12/15.  Post-op shock, back to OR 12/16 to open chest (chest wall compartment syndrome) and again to evacuate hematoma.  TEE post-op with severe RV dysfunction.  CVVHD initiated 12/19 for volume removal in setting of rising Scr/decreased UOP.  Suspect ATN. S/p chest closure 12/23. Hemodynamics much worse on 12/24 with rising pressor demands. Co-ox 36%.  Bedside echo severe biventricular dysfunction with marked septal bounce. Chest re-opened 12/24.  Was atrial paced back to NSR on 12/26 with improvement in hemodynamics but back in atrial fibrillation on 12/27 and unable to pace out, DCCV to NSR on 12/28. Went back to OR 1/5 for chest closure but several hours later required emergent re-opening of chest at bedside to evacuate hematoma. To OR again 1/10 with closure of chest with omental flap with G & J tube.  1/17 partial closure. 1/21 Impella removed.  Off CVVH post-removal, he developed hyperkalemia and wide QRS rhythm requiring transient external pacing, also with suspected septic shock (low SVR, fever, rising WBCs).  Now off pressors and on dobutamine gtt 5, co-ox 62%.  CVP 10-12, ongoing CVVH.  - Will continue dobutamine 5, think he will need this chronically.  - Continues on midodrine.  - Would aim for UF -50 cc/hr net via CVVH. Will need to attempt transition to iHD, ?tomorrow since going to OR today.  - Not candidate to replace Impella (at this point, not candidate for LVAD or transplant). He could stay on dobutamine long-term.  - To go back to OR today for wound vac change chest wall.  2. Atrial fibrillation: H/o PAF.  He was on dofetilide in the remote past but this was stopped due to noncompliance.  He had an upper GI bleed from antral ulcers in 3/12. He was seen by GI and was deemed safe to restart anticoagulation as long as he remains on a PPI.  No apparent recurrence of AF until just prior to this admission, was cardioverted back to NSR in ER but went back into atrial fibrillation.  Post-op afib, DCCV  to NSR/BiV pacing am 12/16 -> back to AF. Rapid atrial pacing back to SR. Maintaining NSR.  - Continue amiodarone per tube 200 mg bid.  - Continue heparin gtt.   3. CAD:   Cath this admission with severe 3 vessel disease. CABG x 4 on  12/15.  - Continue atorvastatin, ASA.  4. Acute on chronic hypoxemic respiratory failure: Vent per CCM.  Has tracheostomy.  - CCM following, will need to work on slowly weaning vent.  5. AKI on CKD stage 3: Likely ATN/cardiorenal. Anuric. On CVVH.    - Will need to transition to iHD, since going to OR today would consider attempting transition tomorrow.  6. Diabetes: Insulin.  7. Hyponatremia: Resolved with CVVH.  8. Torsades/ICD shock: 12/7/212 in hospital event, in setting of severe hypokalemia and hypomagnesemia.   9. Gout: h/o gout. Complained of rt knee pain c/w previous flares - treated w/ prednisone burst (completed).  10: ID: Septic shock 1/21 with fever, low SVR, and elevated WBCs. Cultures NGTD x 2 days.  IJ catheter removed.  - On vancomycin/meropenem, anidulafungin.  - Wean hydrocortisone to 25 q 12 hrs tomorrow.  11. FEN: c/w TFs.  12. Anemia: Hgb 9.1 today.  Transfuse < 7.5. He had 1 unit PRBCs overnight.  13. Stage II Pressure Ulcer- sacrum: Continue to reposition every 2 hours R/L  14. Pleural Effusion: Loculated rt apical pleural effusion noted on imaging. Continue UF through CVVH.  PCCM following  15. VT: Amiodarone + mexiletine.   16. Deconditioning: Profound. Work with PT, nursing to work on range of motion.   Unfortunately, I think that best case scenario at this point is going to be LTACH with HD if he is able to tolerate iHD.  Family meeting yesterday, wife and sister considering DNR order.   CRITICAL CARE Performed by: Loralie Champagne  Total critical care time: 40 minutes  Critical care time was exclusive of separately billable procedures and treating other patients.  Critical care was necessary to treat or prevent imminent or life-threatening deterioration.  Critical care was time spent personally by me on the following activities: development of treatment plan with patient and/or surrogate as well as nursing, discussions with consultants, evaluation of  patient's response to treatment, examination of patient, obtaining history from patient or surrogate, ordering and performing treatments and interventions, ordering and review of laboratory studies, ordering and review of radiographic studies, pulse oximetry and re-evaluation of patient's condition.  Loralie Champagne 02/23/2020 7:17 AM

## 2020-02-24 NOTE — Progress Notes (Signed)
NAME:  Corey Palmer, MRN:  [id number], DOB:  May 19, 1970, LOS: 24 ADMISSION DATE:  01/16/2020, CONSULTATION DATE: 01/17/2020 REFERRING MD: Aundra Dubin CHIEF COMPLAINT: Respiratory failure post Impella  HPI/Course in hospital  50 year old man admitted 12/1 with A. fib/RVR, torsades, cardiogenic shock [EF 15] requiring Impella placement (removed 1/21), CABG x 4 c/b cardiac tamponade requiring chest reopening (now with VAC), AKI requiring on CRRT.  Past Medical History:    has a past medical history of Anginal pain ([place]), Anxiety, Arthritis, Automatic implantable cardioverter-defibrillator in situ (2007), Bipolar disorder ([place]), CAD (coronary artery disease), Cancer ([place]) (2013), CHF (congestive heart failure) ([place]), Chondrocalcinosis of right knee (2/70/6237), Chronic systolic heart failure ([place]), Depression, Diabetes uncomplicated adult-type II, Dilated cardiomyopathy ([place]), Dyslipidemia, Dysrhythmia, GERD (gastroesophageal reflux disease), Gout, unspecified, Hepatic steatosis (2011), History of kidney stones, echocardiogram, Hypertension, Obesity (BMI 30.0-34.9), Pacemaker, Paroxysmal atrial fibrillation ([place]), Peptic ulcer, Shortness of breath, and Sleep apnea.  Significant Hospital Events:  12/2 Afib with RVR. Cardioverted in ED. AICD did not fire. Milrinone for low co ox.  12/7 ICD fired, Torsades- likely from low K of 2.8.  12/8 Cath- multivessel disease w/ low CO. Volume overloaded.  12/11 Underwent Impella 5.5 insertion for persistent symptoms of forward failure with low cardiac indices. 12/12 Back to the OR for displacement of Impella. Extubated.  12/15 CABG x4, with LIMA-LAD, SVG-PDA/PLV, SVG-OM 12/16 Emergently opened chest at bedside for tamponade, clot compressing right atrium  12/16 To OR for Exploration of chest due to bleeding  12/19 Started CRRT 12/23 Sternal Closure in OR 12/24 Worsening shock.  Chest reopened at bedside with improvement in hemodynamics.  No mediastinal  hematoma. 12/25 A flutter > rapid a paced to sinus rhythm.  Remains on high-dose pressors, inotropes 12/28 Weaning vasopressor requirements.  For chest washout today.  Back in atrial fibrillation, refractory to increased amiodarone and attempted overdrive pacing.  DCCV to NSR. Tolerating fluid removal via CRRT. 1/03 Appears euvolemic.  Adjusting glycemic control.  Chest still open so not weaning 1/04 Spiking fever, Vanco meropenem resumed after culture sent from blood and sputum. 1/05 Awaiting return to the OR for wound VAC dressing assessment fever curve and white blood cell count improving after antibiotics resumed the day prior, did have chest closed, developed tamponade physiology shortly after had to go back for emergent reopening of chest, return to intensive care and refractory shock 1/06 Wound VAC dressing replaced.  Still in shock.  Starting to wean sedation 1/07 Still on high-dose pressors, CRRT 1/15 Recurrent Vtach, shocked 1/18 Attempted pressure change to P 3 with addition of Dobutamine . MAP goal > 60 1/19 Ongoing attempts at impella wean, on PS 1/20 Stable on vent, following commands with sedation weaned, continued Impella wean attempt 1/21 Impella removed, on FloTrac, WBC 22K, pancultured, Cefepime/Vanc started empirically for low SVR, concern for sepsis 1/22 antifungals added Impella removed on 1/21, 3 PM CT x3 in place  Consults:  PCCM, nephrology, cardiology, cardiothoracic surgery, EP  Procedures:  R PICC 12/2 >> R axillary Impella 5.5 12/11 >> 1/21 ETT 12/15 >> 1/10 R femoral arterial sheath 12/15 >> 12/31 R femoral HD cath 12/19 >> 12/31 Chest tube - right pleural, left pleural , mediastinal 12/23 >> 1/5 R IJ HD cath 12/31 >> R Radial A-Line 1/6 >> Chest tube - L pleural, R pleural, mediastinal 1/10 >> Trach 1/10 PEG 1/10  Significant Diagnostic Tests:  12/8 LHC  Elevated R and L heart filling pressures with low CO, severe 3-vessel disease  Micro Data:  1/19  BCx2 -no growth at 3 days 1/21 OR Cx >>  Antimicrobials:  Levaquin 1/21 (periop ppx) Meropenem 1/21>> Vanc 1/21 >> Anidulafungin 1/22>>  Interval history:  Had family meeting yesterday, decided to continue aggressive care and try to wean off from vent and CRRT  Patient is scheduled for sternal wound excision and partial closure by plastic surgery today  Objective   Blood pressure 107/72, pulse (!) 44, temperature 97.7 F (36.5 C), temperature source Oral, resp. rate 17, height 6\' 3"  (1.905 m), weight 89.5 kg, SpO2 93 %. CVP:  [6 mmHg-12 mmHg] 9 mmHg  Vent Mode: PRVC FiO2 (%):  [40 %] 40 % Set Rate:  [24 bmp] 24 bmp Vt Set:  [510 mL] 510 mL PEEP:  [5 cmH20] 5 cmH20 Pressure Support:  [5 cmH20] 5 cmH20 Plateau Pressure:  [16 cmH20-22 cmH20] 22 cmH20   Intake/Output Summary (Last 24 hours) at 02/18/2020 0854 Last data filed at 02/26/2020 0800 Gross per 24 hour  Intake 4176.2 ml  Output 6655 ml  Net -2478.8 ml   Filed Weights   02/22/20 0500 02/23/20 0457 02/05/2020 0500  Weight: 93.4 kg 90 kg 89.5 kg   CVP:  [6 mmHg-12 mmHg] 9 mmHg   Examination: General: Chronically ill-appearing male, s/p trach HEENT: Moist oral mucosa, tracheostomy tube in place Neuro: Eyes open, tracks examiner, following simple commands CV: RRR, Chest partially closed. hest tubes in place PULM: Reduced air entry at the bases, no wheezes GI: Active bowel sounds, PEG in place Extremities: Mild edema  skin: Warm, dry, no rashes.  Assessment & Plan:   Acute hypoxic and hypercapnic respiratory failure - s/p trach 1/10 Continue ventilatory support, patient tolerated pressure support trial for 3 hours yesterday then he became hypoxic and tachypneic Today he is a scheduled for sternal wound excision and partial closure, once he comes out of fall we will try pressure support trial again Trend ABGs  Cardiogenic shock requiring inotropic - s/p Impella which was removed 1/21 and now not a candidate for  replacement.  Also s/p CABG complicated by tamponade with multiple chest closures and emergent re-opening, most recently partially closed 1/17. Question of sepsis due to healthcare associated pneumonia Currently blood pressure is stable, off Levophed Continue to require dobutamine and midodrine Flowtrack in place Decrease hydrocortisone to 25 mg twice daily Continue IV vancomycin, meropenem and antifungal for possible sepsis Wound culture is growing staph epi We will complete 7 days of meropenem and antifungal and continue vancomycin to complete 10 days therapy  Persistent small right-sided loculated pleural effusion Continue to monitor  Atrial fibrillation with RVR Torsades Recurrent V. tach No more episodes of V. tach or A. fib Currently on amiodarone to p.o., mexiletine per cards / EP / TCTS Continue to monitor and replete electrolytes as needed  Dysphagia GJ tube present On tube feeds  Acute renal failure on chronic kidney disease stage IIIa Cardiorenal syndrome CRRT was stopped this morning He is off vasopressors we will try to speak with nephrology if he can tolerate IHD  Anemia Multifactorial, Blood loss and critical illness Monitor H&H  Diabetes with hyperglycemia Continue basal and bolus insulin Fingerstick goal 140-180  Profound muscular deconditioning.  Due to prolonged critical illness Physical therapy when appropriate  Daily Goals Checklist  Pain/Anxiety/Delirium protocol (if indicated): Precedex/Dilaudid gtt, weaning as able VAP protocol (if indicated): Bundle in place DVT prophylaxis:  Full dose ASA Nutrition Status: TF GI prophylaxis: Pantoprazole Glucose control: Insulin (Basal, Standing, SSI) Mobility/therapy needs: PT/OT evaluation Code  Status: Full  Family Communication: Per primary team Disposition: ICU   Total critical care time: 32 minutes  Performed by: [place] care time was exclusive of separately billable procedures  and treating other patients.   Critical care was necessary to treat or prevent imminent or life-threatening deterioration.   Critical care was time spent personally by me on the following activities: development of treatment plan with patient and/or surrogate as well as nursing, discussions with consultants, evaluation of patient's response to treatment, examination of patient, obtaining history from patient or surrogate, ordering and performing treatments and interventions, ordering and review of laboratory studies, ordering and review of radiographic studies, pulse oximetry and re-evaluation of patient's condition.   Jacky Kindle MD [facility] See Amion for pager If no response to pager, please call [phone] until 7pm After 7pm, Please call E-link [phone]

## 2020-02-24 NOTE — Plan of Care (Signed)
  Problem: Cardiac: Goal: Ability to achieve and maintain adequate cardiopulmonary perfusion will improve Outcome: Progressing   Problem: Education: Goal: Knowledge of General Education information will improve Description: Including pain rating scale, medication(s)/side effects and non-pharmacologic comfort measures Outcome: Progressing   Problem: Health Behavior/Discharge Planning: Goal: Ability to manage health-related needs will improve Outcome: Progressing   Problem: Clinical Measurements: Goal: Ability to maintain clinical measurements within normal limits will improve Outcome: Progressing Goal: Diagnostic test results will improve Outcome: Progressing Goal: Respiratory complications will improve Outcome: Progressing Goal: Cardiovascular complication will be avoided Outcome: Progressing   Problem: Activity: Goal: Risk for activity intolerance will decrease Outcome: Progressing   Problem: Coping: Goal: Level of anxiety will decrease Outcome: Progressing   Problem: Skin Integrity: Goal: Risk for impaired skin integrity will decrease Outcome: Progressing   Problem: Education: Goal: Ability to demonstrate management of disease process will improve Outcome: Progressing Goal: Ability to verbalize understanding of medication therapies will improve Outcome: Progressing   Problem: Activity: Goal: Capacity to carry out activities will improve Outcome: Progressing   Problem: Cardiac: Goal: Ability to achieve and maintain adequate cardiopulmonary perfusion will improve Outcome: Progressing   Problem: Education: Goal: Ability to verbalize understanding of medication therapies will improve Outcome: Progressing   Problem: Activity: Goal: Capacity to carry out activities will improve Outcome: Progressing   Problem: Cardiac: Goal: Ability to achieve and maintain adequate cardiopulmonary perfusion will improve Outcome: Progressing   Problem: Education: Goal: Will  demonstrate proper wound care and an understanding of methods to prevent future damage Outcome: Progressing Goal: Knowledge of disease or condition will improve Outcome: Progressing Goal: Knowledge of the prescribed therapeutic regimen will improve Outcome: Progressing Goal: Individualized Educational Video(s) Outcome: Progressing   Problem: Activity: Goal: Risk for activity intolerance will decrease Outcome: Progressing   Problem: Cardiac: Goal: Will achieve and/or maintain hemodynamic stability Outcome: Progressing   Problem: Clinical Measurements: Goal: Postoperative complications will be avoided or minimized Outcome: Progressing   Problem: Respiratory: Goal: Respiratory status will improve Outcome: Progressing   Problem: Skin Integrity: Goal: Wound healing without signs and symptoms of infection Outcome: Progressing Goal: Risk for impaired skin integrity will decrease Outcome: Progressing   Problem: Urinary Elimination: Goal: Ability to achieve and maintain adequate renal perfusion and functioning will improve Outcome: Progressing

## 2020-02-24 NOTE — Transfer of Care (Signed)
Immediate Anesthesia Transfer of Care Note  Patient: Corey Palmer  Procedure(s) Performed: Sternal wound debridement (N/A Chest) APPLICATION OF A-CELL (N/A Chest) Wound vac change (N/A Chest)  Patient Location: PACU and ICU  Anesthesia Type:General  Level of Consciousness: sedated and Patient remains intubated per anesthesia plan  Airway & Oxygen Therapy: Patient remains intubated per anesthesia plan and Patient placed on Ventilator (see vital sign flow sheet for setting)  Post-op Assessment: Report given to RN and Post -op Vital signs reviewed and stable  Post vital signs: Reviewed and stable  Last Vitals:  Vitals Value Taken Time  BP    Temp    Pulse 66 01/31/2020 1008  Resp 14 02/16/2020 1008  SpO2 100 % 02/13/2020 1008  Vitals shown include unvalidated device data.  Last Pain:  Vitals:   02/10/2020 0800  TempSrc: Oral  PainSc:       Patients Stated Pain Goal: 0 (36/64/40 3474)  Complications: No complications documented.   Patient transported to ICU with standard monitors (HR, BP, SPO2, RR) and emergency drugs/equipment. Controlled ventilation maintained via ambu bag. Report given to bedside RN and respiratory therapist. Pt connected to ICU monitor and ventilator. All questions answered and vital signs stable before leaving

## 2020-02-25 DIAGNOSIS — I4891 Unspecified atrial fibrillation: Secondary | ICD-10-CM | POA: Diagnosis not present

## 2020-02-25 DIAGNOSIS — R570 Cardiogenic shock: Secondary | ICD-10-CM | POA: Diagnosis not present

## 2020-02-25 LAB — CBC WITH DIFFERENTIAL/PLATELET
Abs Immature Granulocytes: 0.18 10*3/uL — ABNORMAL HIGH (ref 0.00–0.07)
Basophils Absolute: 0 10*3/uL (ref 0.0–0.1)
Basophils Relative: 0 %
Eosinophils Absolute: 0.1 10*3/uL (ref 0.0–0.5)
Eosinophils Relative: 1 %
HCT: 31.4 % — ABNORMAL LOW (ref 39.0–52.0)
Hemoglobin: 8.8 g/dL — ABNORMAL LOW (ref 13.0–17.0)
Immature Granulocytes: 1 %
Lymphocytes Relative: 7 %
Lymphs Abs: 0.9 10*3/uL (ref 0.7–4.0)
MCH: 26.2 pg (ref 26.0–34.0)
MCHC: 28 g/dL — ABNORMAL LOW (ref 30.0–36.0)
MCV: 93.5 fL (ref 80.0–100.0)
Monocytes Absolute: 1.1 10*3/uL — ABNORMAL HIGH (ref 0.1–1.0)
Monocytes Relative: 8 %
Neutro Abs: 11.1 10*3/uL — ABNORMAL HIGH (ref 1.7–7.7)
Neutrophils Relative %: 83 %
Platelets: 332 10*3/uL (ref 150–400)
RBC: 3.36 MIL/uL — ABNORMAL LOW (ref 4.22–5.81)
RDW: 20.2 % — ABNORMAL HIGH (ref 11.5–15.5)
WBC: 13.4 10*3/uL — ABNORMAL HIGH (ref 4.0–10.5)
nRBC: 0.1 % (ref 0.0–0.2)

## 2020-02-25 LAB — RENAL FUNCTION PANEL
Albumin: 1.9 g/dL — ABNORMAL LOW (ref 3.5–5.0)
Albumin: 2.1 g/dL — ABNORMAL LOW (ref 3.5–5.0)
Anion gap: 8 (ref 5–15)
Anion gap: 8 (ref 5–15)
BUN: 38 mg/dL — ABNORMAL HIGH (ref 6–20)
BUN: 40 mg/dL — ABNORMAL HIGH (ref 6–20)
CO2: 24 mmol/L (ref 22–32)
CO2: 24 mmol/L (ref 22–32)
Calcium: 7.9 mg/dL — ABNORMAL LOW (ref 8.9–10.3)
Calcium: 7.9 mg/dL — ABNORMAL LOW (ref 8.9–10.3)
Chloride: 101 mmol/L (ref 98–111)
Chloride: 103 mmol/L (ref 98–111)
Creatinine, Ser: 0.95 mg/dL (ref 0.61–1.24)
Creatinine, Ser: 0.97 mg/dL (ref 0.61–1.24)
GFR, Estimated: >60 mL/min (ref >60)
GFR, Estimated: >60 mL/min (ref >60)
Glucose, Bld: 138 mg/dL — ABNORMAL HIGH (ref 70–99)
Glucose, Bld: 175 mg/dL — ABNORMAL HIGH (ref 70–99)
Phosphorus: 2.4 mg/dL — ABNORMAL LOW (ref 2.5–4.6)
Phosphorus: 2.5 mg/dL (ref 2.5–4.6)
Potassium: 3.5 mmol/L (ref 3.5–5.1)
Potassium: 3.8 mmol/L (ref 3.5–5.1)
Sodium: 133 mmol/L — ABNORMAL LOW (ref 135–145)
Sodium: 135 mmol/L (ref 135–145)

## 2020-02-25 LAB — COOXEMETRY PANEL
Carboxyhemoglobin: 1.8 % — ABNORMAL HIGH (ref 0.5–1.5)
Methemoglobin: 0.8 % (ref 0.0–1.5)
O2 Saturation: 61.4 %
Total hemoglobin: 11.4 g/dL — ABNORMAL LOW (ref 12.0–16.0)

## 2020-02-25 LAB — GLUCOSE, CAPILLARY
Glucose-Capillary: 157 mg/dL — ABNORMAL HIGH (ref 70–99)
Glucose-Capillary: 133 mg/dL — ABNORMAL HIGH (ref 70–99)
Glucose-Capillary: 184 mg/dL — ABNORMAL HIGH (ref 70–99)
Glucose-Capillary: 179 mg/dL — ABNORMAL HIGH (ref 70–99)
Glucose-Capillary: 137 mg/dL — ABNORMAL HIGH (ref 70–99)
Glucose-Capillary: 171 mg/dL — ABNORMAL HIGH (ref 70–99)

## 2020-02-25 LAB — HEPARIN LEVEL (UNFRACTIONATED): Heparin Unfractionated: 0.23 IU/mL — ABNORMAL LOW (ref 0.30–0.70)

## 2020-02-25 LAB — MAGNESIUM: Magnesium: 2.3 mg/dL (ref 1.7–2.4)

## 2020-02-25 MED ORDER — AMIODARONE HCL 200 MG PO TABS
200.0000 mg | ORAL_TABLET | Freq: Two times a day (BID) | ORAL | Status: DC
  Administered 2020-02-25 – 2020-03-07 (×23): 200 mg
  Filled 2020-02-25 (×23): qty 1

## 2020-02-25 NOTE — Progress Notes (Signed)
[facility] for Heparin Indication: Afib  Allergies  Allergen Reactions  . Orange Fruit Anaphylaxis, Hives and Other (See Comments)    Per Pt- Blisters around lips and Hives all over, also  . Penicillins Anaphylaxis    Did it involve swelling of the face/tongue/throat, SOB, or low BP? Yes Did it involve sudden or severe rash/hives, skin peeling, or any reaction on the inside of your mouth or nose? Yes Did you need to seek medical attention at a hospital or doctor's office? No When did it last happen?childhood If all above answers are "NO", may proceed with cephalosporin use.  [name] [Empagliflozin] Itching  . Basaglar Kwikpen [Insulin Glargine] Nausea And Vomiting    Patient Measurements: Height: 6\' 3"  (190.5 cm) Weight: 89 kg (196 lb 3.4 oz) IBW/kg (Calculated) : 84.5 Heparin dosing wt: 101 kg  Vital Signs: Temp: 97.3 F (36.3 C) (01/28 0731) Temp Source: Axillary (01/28 0400) BP: 109/67 (01/28 0700) Pulse Rate: 72 (01/28 0700)  Labs: Recent Labs    02/23/20 0347 02/23/20 0523 02/23/20 1551 01/31/2020 0322 02/19/2020 1537 02/25/20 0155 02/25/20 0417  HGB 8.6* 9.5*  --  9.1*  --   --  8.8*  HCT 28.4* 28.0*  --  31.5*  --   --  31.4*  PLT 361  --   --  362  --   --  332  HEPARINUNFRC 0.29*  --   --  0.32  --  0.23*  --   CREATININE 1.04  --    < > 0.98 0.94  --  0.95   < > = values in this interval not displayed.    Estimated Creatinine Clearance: 109.9 mL/min (by C-G formula based on SCr of 0.95 mg/dL).   Assessment: 41 yoM with hx PAF on apixaban PTA. Pt admitted with shock and required Impella 5.5 support. CABG 12/15 with chest left open, c/b hematoma and significant bleeding. Ultimately heparin started via purge for Impella. DCCV performed 12/16, partial chest closure 1/5, Impella removed 1/21. Heparin resumed for AFib 1/22.  Heparin level slightly below goal at 0.23, CBC stable, no bleeding documented.  Goal of  Therapy:  Heparin level ~0.3  Monitor platelets by anticoagulation protocol: Yes   Plan:  Increase heparin to 1600 units/h Recheck heparin level with am labs Watch closely for S/Sx bleeding   [name], PharmD, BCPS, [facility] Clinical Pharmacist [phone] Please check AMION for all [facility] Pharmacy numbers 02/25/2020

## 2020-02-25 NOTE — Progress Notes (Signed)
NAME:  Corey Palmer, MRN:  [id number], DOB:  03/30/1970, LOS: 20 ADMISSION DATE:  12/31/2019, CONSULTATION DATE: 01/19/2020 REFERRING MD: Aundra Dubin CHIEF COMPLAINT: Respiratory failure post Impella  HPI/Course in hospital  50 year old man admitted 12/1 with A. fib/RVR, torsades, cardiogenic shock [EF 15] requiring Impella placement (removed 1/21), CABG x 4 c/b cardiac tamponade requiring chest reopening (now with VAC), AKI requiring on CRRT.  Past Medical History:    has a past medical history of Anginal pain ([place]), Anxiety, Arthritis, Automatic implantable cardioverter-defibrillator in situ (2007), Bipolar disorder ([place]), CAD (coronary artery disease), Cancer ([place]) (2013), CHF (congestive heart failure) ([place]), Chondrocalcinosis of right knee (5/63/1497), Chronic systolic heart failure ([place]), Depression, Diabetes uncomplicated adult-type II, Dilated cardiomyopathy ([place]), Dyslipidemia, Dysrhythmia, GERD (gastroesophageal reflux disease), Gout, unspecified, Hepatic steatosis (2011), History of kidney stones, echocardiogram, Hypertension, Obesity (BMI 30.0-34.9), Pacemaker, Paroxysmal atrial fibrillation ([place]), Peptic ulcer, Shortness of breath, and Sleep apnea.  Significant Hospital Events:  12/2 Afib with RVR. Cardioverted in ED. AICD did not fire. Milrinone for low co ox.  12/7 ICD fired, Torsades- likely from low K of 2.8.  12/8 Cath- multivessel disease w/ low CO. Volume overloaded.  12/11 Underwent Impella 5.5 insertion for persistent symptoms of forward failure with low cardiac indices. 12/12 Back to the OR for displacement of Impella. Extubated.  12/15 CABG x4, with LIMA-LAD, SVG-PDA/PLV, SVG-OM 12/16 Emergently opened chest at bedside for tamponade, clot compressing right atrium  12/16 To OR for Exploration of chest due to bleeding  12/19 Started CRRT 12/23 Sternal Closure in OR 12/24 Worsening shock.  Chest reopened at bedside with improvement in hemodynamics.  No mediastinal  hematoma. 12/25 A flutter > rapid a paced to sinus rhythm.  Remains on high-dose pressors, inotropes 12/28 Weaning vasopressor requirements.  For chest washout today.  Back in atrial fibrillation, refractory to increased amiodarone and attempted overdrive pacing.  DCCV to NSR. Tolerating fluid removal via CRRT. 1/03 Appears euvolemic.  Adjusting glycemic control.  Chest still open so not weaning 1/04 Spiking fever, Vanco meropenem resumed after culture sent from blood and sputum. 1/05 Awaiting return to the OR for wound VAC dressing assessment fever curve and white blood cell count improving after antibiotics resumed the day prior, did have chest closed, developed tamponade physiology shortly after had to go back for emergent reopening of chest, return to intensive care and refractory shock 1/06 Wound VAC dressing replaced.  Still in shock.  Starting to wean sedation 1/07 Still on high-dose pressors, CRRT 1/15 Recurrent Vtach, shocked 1/18 Attempted pressure change to P 3 with addition of Dobutamine . MAP goal > 60 1/19 Ongoing attempts at impella wean, on PS 1/20 Stable on vent, following commands with sedation weaned, continued Impella wean attempt 1/21 Impella removed, on FloTrac, WBC 22K, pancultured, Cefepime/Vanc started empirically for low SVR, concern for sepsis 1/22 antifungals added Impella removed on 1/21, 3 PM CT x3 in place  Consults:  PCCM, nephrology, cardiology, cardiothoracic surgery, EP  Procedures:  R PICC 12/2 >> R axillary Impella 5.5 12/11 >> 1/21 ETT 12/15 >> 1/10 R femoral arterial sheath 12/15 >> 12/31 R femoral HD cath 12/19 >> 12/31 Chest tube - right pleural, left pleural , mediastinal 12/23 >> 1/5 R IJ HD cath 12/31 >> R Radial A-Line 1/6 >> Chest tube - L pleural, R pleural, mediastinal 1/10 >> Trach 1/10 PEG 1/10  Significant Diagnostic Tests:  12/8 LHC  Elevated R and L heart filling pressures with low CO, severe 3-vessel disease  Micro Data:  1/19  BCx2 -no growth at 3 days 1/21 OR Cx >>  Antimicrobials:  Levaquin 1/21 (periop ppx) Meropenem 1/21>> Vanc 1/21 >> Anidulafungin 1/22>>  Interval history:   POD1 sternal wound excision/debridement, ACell application, partial closure  NAEO Objective   Blood pressure 104/69, pulse 78, temperature (!) 97.3 F (36.3 C), temperature source Axillary, resp. rate 16, height 6\' 3"  (1.905 m), weight 89 kg, SpO2 100 %. CVP:  [4 mmHg-34 mmHg] 7 mmHg  Vent Mode: PSV FiO2 (%):  [40 %] 40 % Set Rate:  [24 bmp] 24 bmp Vt Set:  [510 mL] 510 mL PEEP:  [5 cmH20] 5 cmH20 Pressure Support:  [8 cmH20] 8 cmH20 Plateau Pressure:  [17 cmH20-24 cmH20] 17 cmH20   Intake/Output Summary (Last 24 hours) at 02/25/2020 1105 Last data filed at 02/25/2020 1000 Gross per 24 hour  Intake 4132.69 ml  Output 4529 ml  Net -396.31 ml   Filed Weights   02/23/20 0457 02/16/2020 0500 02/25/20 0500  Weight: 90 kg 89.5 kg 89 kg   CVP:  [4 mmHg-34 mmHg] 7 mmHg   Examination: General: Chronically/critically ill appearing middle aged M, supine, trach/vent/CRRT NAD HEENT: Pink mmm anicteric sclera trachea midline trach secure  Neuro: Drowsy. Awakens, follows simple commands, attempting to phonate  CV: RRR. Partial chest closure with wound vac in place. Chest tube JP  PULM: Symmetrical chest expansion. Even unlabored resp on PSV/CPAP but variable Vt. No adventitious sounds  GI: PEG. Soft ndnt  Extremities: no obvious joint deformity. No cyanosis or clubbing. Mild edema skin: c/d/w   Assessment & Plan:   Acute hypoxic and hypercapnic respiratory failure requiring mechanical ventilation S/p tracheostomy 1/10 Possible sepsis due to HCAP Persistent small R pleural effusion P -cont MV support, wean as able. Currently tolerating PSV/CPAP but historically has become hypoxic/tachypnic after a few hours  -VAP, PAD -pulm hygiene -cont to monitor effusion, PRN CXR  -eraxis, mero, vanc for possible sepsis   Cardiogenic  shock requiring inotropic support -s/p impella, removed 1/21 -s/p CABG c/b tamponade and multiple chest re-openings.  -s/p excision/debridement + ACell application, partial closure with plastics 1/27 -sternal wound infection: staph epidermis P -per CVTS / HF  -plastics plan for return OR next week for debridement+ matrix app, vac change and possible further closure. Date TBD -dobutamine -midodrine -hydrocort 25 mg q12 hr (decreased to this dose on 1/27) - 7 d mero, 7 d eraxis, 10 d vanc  Afib RVR, improved Recurrent VT, improved Torsades, improved  P -cardiac monitoring -electrolyte optimization -amio, mexiletine  Acute renal failure on CKD IIIa Cardiorenal syndrome -nephro following -stop CRRT when circuit clots, plan to trial iHD   Anemia -critical illness, blood loss anemia -trend H/H transfuse PRN  DM with hyperglycemia -SSI + basal   Profound deconditioning due to prolonged critical illness Dysphagia -EN per RDN -PT/OT when appropriate  -fiber, lomotil   Daily Goals Checklist  Pain/Anxiety/Delirium protocol (if indicated): Precedex/Dilaudid gtt, weaning as able VAP protocol (if indicated): yes DVT prophylaxis:  Heparin gtt Nutrition Status: TF GI prophylaxis: Pantoprazole Glucose control: Insulin (Basal, Standing, SSI) Mobility/therapy needs: per primary Code Status: Full  Family Communication: per primary Disposition: ICU   CRITICAL CARE Performed by: Cristal Generous   Total critical care time: 37 minutes  Critical care time was exclusive of separately billable procedures and treating other patients. Critical care was necessary to treat or prevent imminent or life-threatening deterioration.  Critical care was time spent personally by me on the following activities: development  of treatment plan with patient and/or surrogate as well as nursing, discussions with consultants, evaluation of patient's response to treatment, examination of patient,  obtaining history from patient or surrogate, ordering and performing treatments and interventions, ordering and review of laboratory studies, ordering and review of radiographic studies, pulse oximetry and re-evaluation of patient's condition.  Eliseo Gum MSN, AGACNP-BC [place] [phone] If no answer, [phone] 02/25/2020, 11:05 AM

## 2020-02-25 NOTE — Progress Notes (Signed)
[facility] NEPHROLOGY PROGRESS NOTE  Assessment/ Plan: Pt is a 50 y.o. yo male  with familial cardiomyopathy EF20% s/p redo CABG 11/94/17 complicated by persistent shock, multiple attempts at sternal closure with formation of chest hematoma and tamponade, Afib with RVR, and AKI requiring CRRT.    # Anuric AKI/CKD stage III- presumably due to ischemic ATN in setting of cardiogenic shock s/p CABG.  CRRT started 01/16/20.  All fluids 4K/2.5Ca.  RIJ on 01/28/20. Continue CRRT, no prescription change, UF is net negative as tolerated- cont this rate.  Hyperkalemia improved.  The line was changed to right femoral catheter.  He is on inotropic support and vasopressin currently.  I'm not sure he would tolerate HD at this point, particularly in light of his significant fluid removal needs due to obligatory intakes with meds, etc but plan is to hold CRRT next time there is a natural break (clots, filter change out, stopped for OR. etc), see if he can tolerate a 24-48h pause (which will be necessary for conversion to iHD) and then see if he can tolerate the hemodynamic stress of hemodialysis.   # CAD s/p CABG complicated by cardiogenic shock and post-op hematoma. secondary closure on 12/23 then had to be re opened. Went to the OR for washout on 12/28, bedside washout 02/01/19.  OR 02/16/2020 for closure, then had emergent reopening at the bedside 02/20/2020 d/t anterior chest hematoma again. S/p OR plastic surgery 1/27 - excision of wound, 2cm closure.   # Cardiogenic shock- underlying familial cardiomyopathy s/p redo CABG, EF ~20%. Remains on pressors, inotrope. Midodrine 20 tid.  The Impella was removed on 1/21.  # ABLA- s/p postoperative bleeding into chest cavity s/p re-exploration for tamponade/hematoma. Received multiple blood products  # Atrial fibrillation - s/p DCCV on 12/16. On amiodarone.   # VDRF- per CHF/PCCMsvc. Trach/peg 1/10  Subjective: Seen and examined in ICU.    Running  CRRT.  On vent.  Remains critically ill without any new event.  I/Os 4.2 / 4.2. Discussed with RN.    Objective Vital signs in last 24 hours: Vitals:   02/25/20 1200 02/25/20 1230 02/25/20 1300 02/25/20 1330  BP: 105/71 101/68 117/81 105/64  Pulse: 88 83 91 87  Resp: _0 Temp:      TempSrc:      SpO2: 100% 100% 100% 100%  Weight:      Height:       Weight change: -0.5 kg  Intake/Output Summary (Last 24 hours) at 02/25/2020 1412 Last data filed at 02/25/2020 1300 Gross per 24 hour  Intake 3473.08 ml  Output 4933 ml  Net -1459.92 ml    Labs: Basic Metabolic Panel: Recent Labs  Lab 02/10/2020 0322 02/13/2020 1537 02/25/20 0417  NA 135 136 133*  K 3.8 3.8 3.5  CL 104 103 101  CO2 _1 GLUCOSE 77 162* 138*  BUN 40* 37* 38*  CREATININE 0.98 0.94 0.95  CALCIUM 8.1* 7.9* 7.9*  PHOS 2.4* 3.2 2.4*   Liver Function Tests: Recent Labs  Lab 01/29/2020 0322 01/30/2020 1537 02/25/20 0417  ALBUMIN 1.9* 2.1* 1.9*   No results for input(s): LIPASE, AMYLASE in the last 168 hours. No results for input(s): AMMONIA in the last 168 hours. CBC: Recent Labs  Lab 02/21/20 0420 02/21/20 0625 02/22/20 0415 02/23/20 0347 02/23/20 0523 02/08/2020 0322 02/25/20 0417  WBC 16.2*  --  16.3* 17.4*  --  15.9* 13.4*  NEUTROABS 14.5*  --  14.4* 15.3*  --  13.2* 11.1*  HGB 7.9*   < > 8.0* 8.6* 9.5* 9.1* 8.8*  HCT 25.2*   < > 25.9* 28.4* 28.0* 31.5* 31.4*  MCV 88.1  --  90.2 90.7  --  91.6 93.5  PLT 329  --  315 361  --  362 332   < > = values in this interval not displayed.   Cardiac Enzymes: No results for input(s): CKTOTAL, CKMB, CKMBINDEX, TROPONINI in the last 168 hours. CBG: Recent Labs  Lab 02/04/2020 2001 02/25/20 0119 02/25/20 0424 02/25/20 0801 02/25/20 1129  GLUCAP 132* 157* 133* 184* 179*    Iron Studies: No results for input(s): IRON, TIBC, TRANSFERRIN, FERRITIN in the last 72 hours. Studies/Results: No results found.  Medications: Infusions: .  prismasol  BGK 4/2.5 500 mL/hr at 02/25/20 0827  .  prismasol BGK 4/2.5 200 mL/hr at 02/25/20 5300  . sodium chloride Stopped (02/25/20 0702)  . sodium chloride    . anidulafungin Stopped (02/21/2020 2213)  . dexmedetomidine (PRECEDEX) IV infusion 0.7 mcg/kg/hr (02/25/20 1300)  . dextrose Stopped (02/08/2020 1846)  . DOBUTamine 5 mcg/kg/min (02/25/20 1300)  . feeding supplement (PIVOT 1.5 CAL) 1,000 mL (02/25/20 0939)  . heparin 1,600 Units/hr (02/25/20 1300)  . HYDROmorphone 2 mg/hr (02/25/20 1312)  . lactated ringers 10 mL/hr at 02/25/20 1300  . meropenem (MERREM) IV 1 g (02/25/20 1317)  . prismasol BGK 4/2.5 1,500 mL/hr at 02/25/20 1312  . vancomycin Stopped (02/03/2020 1651)    Scheduled Medications: . sodium chloride   Intravenous Once  . amiodarone  200 mg Per Tube BID  . vitamin C  500 mg Per Tube TID  . aspirin  324 mg Per Tube Daily  . atorvastatin  80 mg Per Tube Daily  . busPIRone  10 mg Per Tube TID  . chlorhexidine gluconate (MEDLINE KIT)  15 mL Mouth Rinse BID  . Chlorhexidine Gluconate Cloth  6 each Topical Daily  . darbepoetin (ARANESP) injection - NON-DIALYSIS  100 mcg Subcutaneous Q Sat-1800  . feeding supplement (PROSource TF)  45 mL Per Tube TID  . fiber  1 packet Per Tube BID  . FLUoxetine  40 mg Per Tube BID  . hydrocortisone sod succinate (SOLU-CORTEF) inj  25 mg Intravenous Q12H  . HYDROmorphone  4 mg Per Tube Q6H  . insulin aspart  0-9 Units Subcutaneous Q4H  . insulin aspart  4 Units Subcutaneous Q4H  . insulin detemir  12 Units Subcutaneous Q12H  . LORazepam  1 mg Intravenous Q4H  . mouth rinse  15 mL Mouth Rinse 10 times per day  . melatonin  5 mg Per Tube Daily  . mexiletine  150 mg Per Tube Q8H  . midodrine  20 mg Per Tube TID WC  . multivitamin  1 tablet Per Tube QHS  . nutrition supplement (JUVEN)  1 packet Per Tube BID BM  . pantoprazole (PROTONIX) IV  40 mg Intravenous Q12H  . sodium chloride flush  10-40 mL Intracatheter Q12H  . thiamine  100 mg Per Tube  Daily  . valproic acid  250 mg Per Tube BID    have reviewed scheduled and prn medications.  Physical Exam: General: Critically ill looking male sedated and on vent via trach.  Heart:RRR, s1s2 nl Lungs: Coarse breath sound bilateral Abdomen:soft, Non-tender, non-distended Extremities trace generalized edema Dialysis Access:  right femoral catheter site clean.  Justin Mend 02/25/2020,2:12 PM  LOS: 57 days

## 2020-02-25 NOTE — Progress Notes (Signed)
Occupational Therapy Treatment Patient Details Name: Corey Palmer MRN: [id number] DOB: 07-21-1970 Today's Date: 02/25/2020    History of present illness 50 yo male admitted 12/1 with A. fib/RVR, torsades, cardiogenic shock (EF 15) requiring Impella placement (removed 1/21), CABG x 4 c/b cardiac tamponade requiring chest reopening (now with VAC), AKI requiring initiation of CRRT. PMH including anxiety, arthritis, CAD, CHF, DM type ll, HTN, obesity, pacemaker, paroxysmal a-fib, and sleep apnea.   OT comments  Continued PROM to all four extremities.  As with prior sessions: "Due to CRRT going through R groin line unable to advance mobility to EOB. At this time pt is dependent for rolling and has minimal active movement in all 4 extremities. Pt swollen and tight with some grimacing at bilat hands and feet with ROM."  Of note: CRRT is being switched to HD sometime this weekend, hopefully bed mobility can be progressed.  OT to continue efforts in the acute setting to maximize functional status.    Follow Up Recommendations  LTACH    Equipment Recommendations  Other (comment);Wheelchair (measurements OT);Wheelchair cushion (measurements OT);3 in 1 bedside commode;Hospital bed    Recommendations for Other Services      Precautions / Restrictions Precautions Precautions: Fall;Sternal Precaution Comments: CRRT via R groin line, Vent on 5 of peep, wound vac on chest, continuous O2 at 40% Restrictions Other Position/Activity Restrictions: sternal precautions       Mobility Bed Mobility               General bed mobility comments: Total A for repositioning  Transfers                      Balance                                           ADL either performed or assessed with clinical judgement   ADL                                         General ADL Comments: Total A for ADLs and bed mobility     Vision       Perception      Praxis      Cognition Arousal/Alertness: Awake/alert Behavior During Therapy: Flat affect Overall Cognitive Status: Impaired/Different from baseline                         Following Commands: Follows one step commands inconsistently                                  Pertinent Vitals/ Pain       Pain Assessment: Faces Faces Pain Scale: Hurts a little bit Pain Location: grimacing with ROM in wrist and hand, bilat ankles and L hip and knee Pain Descriptors / Indicators: Grimacing Pain Intervention(s): Monitored during session                                                          Frequency  Min 2X/week        Progress Toward Goals  OT Goals(current goals can now be found in the care plan section)     Acute Rehab OT Goals Patient Stated Goal: unstated OT Goal Formulation: Patient unable to participate in goal setting Time For Goal Achievement: 03/07/20 Potential to Achieve Goals: Fair  Plan      Co-evaluation                 AM-PAC OT "6 Clicks" Daily Activity     Outcome Measure   Help from another person eating meals?: Total Help from another person taking care of personal grooming?: Total Help from another person toileting, which includes using toliet, bedpan, or urinal?: Total Help from another person bathing (including washing, rinsing, drying)?: Total Help from another person to put on and taking off regular upper body clothing?: Total Help from another person to put on and taking off regular lower body clothing?: Total 6 Click Score: 6    End of Session Equipment Utilized During Treatment: Oxygen  OT Visit Diagnosis: Unsteadiness on feet (R26.81);Other abnormalities of gait and mobility (R26.89);Muscle weakness (generalized) (M62.81);Pain   Activity Tolerance Patient limited by lethargy   Patient Left in bed;with call bell/phone within reach   Nurse Communication          Time:  4353-9122 OT Time Calculation (min): 11 min  Charges: OT General Charges $OT Visit: 1 Visit OT Treatments $Therapeutic Exercise: 8-22 mins  02/25/2020  Rich, OTR/L  Acute Rehabilitation Services  Office:  [phone]    Metta Clines 02/25/2020, 2:47 PM

## 2020-02-25 NOTE — Progress Notes (Signed)
1 Day Post-Op  Subjective: 50 year old male status post additional excision of sternal wound, additional primary closure -2 cm, evacuation of hematoma, placement of wound matrix, placement of wound VAC yesterday with Dr. Marla Roe.  Patient is sedated, nursing staff at bedside.  Objective: Vital signs in last 24 hours: Temp:  [96.2 F (35.7 C)-97.8 F (36.6 C)] 97.3 F (36.3 C) (01/28 0731) Pulse Rate:  [65-102] 72 (01/28 0700) Resp:  [0-28] 19 (01/28 0700) BP: (87-123)/(52-88) 109/67 (01/28 0700) SpO2:  [88 %-100 %] 100 % (01/28 0700) Arterial Line BP: (54-115)/(18-48) 115/48 (01/27 1135) FiO2 (%):  [40 %] 40 % (01/28 0731) Weight:  [89 kg] 89 kg (01/28 0500) Last BM Date: 02/11/2020  Intake/Output from previous day: 01/27 0701 - 01/28 0700 In: 4279.1 [I.V.:2133.6; NG/GT:895.5; IV Piggyback:630] Out: 6269 [Drains:310; Stool:800; Blood:10] Intake/Output this shift: No intake/output data recorded.  General appearance: Sedated, ill-appearing male Neck: Trach in place Incision/Wound: Sternal wound with wound VAC in place, good seal noted, 50 cc of serosanguineous drainage in canister.  No surrounding erythema noted.  Lab Results:  CBC Latest Ref Rng & Units 02/25/2020 02/18/2020 02/23/2020  WBC 4.0 - 10.5 K/uL 13.4(H) 15.9(H) -  Hemoglobin 13.0 - 17.0 g/dL 8.8(L) 9.1(L) 9.5(L)  Hematocrit 39.0 - 52.0 % 31.4(L) 31.5(L) 28.0(L)  Platelets 150 - 400 K/uL 332 362 -    BMET Recent Labs    02/22/2020 1537 02/25/20 0417  NA 136 133*  K 3.8 3.5  CL 103 101  CO2 24 24  GLUCOSE 162* 138*  BUN 37* 38*  CREATININE 0.94 0.95  CALCIUM 7.9* 7.9*   PT/INR No results for input(s): LABPROT, INR in the last 72 hours. ABG Recent Labs    02/23/20 0523  PHART 7.400  HCO3 25.7    Studies/Results: No results found.  Anti-infectives: Anti-infectives (From admission, onward)   Start     Dose/Rate Route Frequency Ordered Stop   02/20/20 2000  anidulafungin (ERAXIS) 100 mg in sodium  chloride 0.9 % 100 mL IVPB        100 mg 78 mL/hr over 100 Minutes Intravenous Every 24 hours 02/19/20 1425 02/25/20 2359   02/19/20 1600  vancomycin (VANCOCIN) IVPB 1000 mg/200 mL premix        1,000 mg 200 mL/hr over 60 Minutes Intravenous Every 24 hours 02/19/20 1056 02/29/20 1559   02/19/20 1600  anidulafungin (ERAXIS) 200 mg in sodium chloride 0.9 % 200 mL IVPB        200 mg 78 mL/hr over 200 Minutes Intravenous  Once 02/19/20 1425 02/19/20 2045   02/19/20 1400  vancomycin (VANCOCIN) IVPB 1000 mg/200 mL premix  Status:  Discontinued        1,000 mg 200 mL/hr over 60 Minutes Intravenous Every 24 hours 02/20/2020 1312 02/02/2020 1633   02/19/20 1400  meropenem (MERREM) 1 g in sodium chloride 0.9 % 100 mL IVPB        1 g 200 mL/hr over 30 Minutes Intravenous Every 8 hours 02/19/20 1056 02/25/20 2359   02/04/2020 1800  meropenem (MERREM) 1 g in sodium chloride 0.9 % 100 mL IVPB  Status:  Discontinued        1 g 200 mL/hr over 30 Minutes Intravenous Every 12 hours 02/10/2020 1620 02/19/20 1056   02/01/2020 1400  ceFEPIme (MAXIPIME) 2 g in sodium chloride 0.9 % 100 mL IVPB  Status:  Discontinued        2 g 200 mL/hr over 30 Minutes Intravenous Every 12 hours 01/29/2020  1312 02/16/2020 1620   02/04/2020 1400  vancomycin (VANCOREADY) IVPB 2000 mg/400 mL        2,000 mg 200 mL/hr over 120 Minutes Intravenous  Once 02/03/2020 1312 02/02/2020 1827   02/17/2020 0859  vancomycin (VANCOCIN) 1,000 mg in sodium chloride 0.9 % 1,000 mL irrigation  Status:  Discontinued          As needed 02/12/2020 0859 02/13/2020 0927   02/27/2020 0800  levofloxacin (LEVAQUIN) IVPB 500 mg        500 mg 100 mL/hr over 60 Minutes Intravenous To Surgery 02/11/2020 0758 02/28/2020 0905   02/27/2020 1115  vancomycin (VANCOREADY) IVPB 1500 mg/300 mL  Status:  Discontinued        1,500 mg 150 mL/hr over 120 Minutes Intravenous NOW 02/09/2020 1103 02/17/2020 1245   02/25/2020 1400  vancomycin (VANCOCIN) IVPB 1000 mg/200 mL premix  Status:  Discontinued         1,000 mg 200 mL/hr over 60 Minutes Intravenous Every 24 hours 02/01/20 1011 02/09/20 0957   02/01/20 1400  meropenem (MERREM) 1 g in sodium chloride 0.9 % 100 mL IVPB  Status:  Discontinued        1 g 200 mL/hr over 30 Minutes Intravenous Every 8 hours 02/01/20 1011 02/09/20 0957   02/01/20 1100  vancomycin (VANCOREADY) IVPB 2000 mg/400 mL        2,000 mg 200 mL/hr over 120 Minutes Intravenous  Once 02/01/20 1011 02/01/20 1327   01/28/20 1300  vancomycin (VANCOCIN) IVPB 1000 mg/200 mL premix  Status:  Discontinued        1,000 mg 200 mL/hr over 60 Minutes Intravenous Every 24 hours 01/27/20 1430 01/28/20 0830   01/24/20 1300  vancomycin (VANCOCIN) IVPB 1000 mg/200 mL premix  Status:  Discontinued        1,000 mg 200 mL/hr over 60 Minutes Intravenous Every 24 hours 01/24/20 0846 01/27/20 1430   01/22/20 1601  vancomycin variable dose per unstable renal function (pharmacist dosing)  Status:  Discontinued         Does not apply See admin instructions 01/22/20 1601 01/24/20 0846   01/21/20 1200  vancomycin (VANCOREADY) IVPB 1250 mg/250 mL  Status:  Discontinued        1,250 mg 166.7 mL/hr over 90 Minutes Intravenous Every 24 hours 01/21/2020 1620 01/22/20 1457   01/25/2020 1800  meropenem (MERREM) 1 g in sodium chloride 0.9 % 100 mL IVPB  Status:  Discontinued        1 g 200 mL/hr over 30 Minutes Intravenous Every 8 hours 01/10/2020 1800 01/28/20 0830   01/04/2020 1348  vancomycin (VANCOCIN) 1,000 mg in sodium chloride 0.9 % 1,000 mL irrigation  Status:  Discontinued          As needed 01/27/2020 1349 01/16/2020 1611   01/21/2020 1015  vancomycin (VANCOREADY) IVPB 1500 mg/300 mL        1,500 mg 150 mL/hr over 120 Minutes Intravenous On call to O.R. 01/11/2020 0927 01/12/2020 1556   01/19/20 1700  vancomycin (VANCOREADY) IVPB 1250 mg/250 mL  Status:  Discontinued        1,250 mg 166.7 mL/hr over 90 Minutes Intravenous Every 24 hours 01/18/20 1618 01/25/2020 1620   01/16/20 1700  vancomycin (VANCOREADY) IVPB  1500 mg/300 mL  Status:  Discontinued        1,500 mg 150 mL/hr over 120 Minutes Intravenous Every 24 hours 01/16/20 1121 01/18/20 1618   01/15/20 1700  vancomycin (VANCOREADY) IVPB 750 mg/150 mL  Status:  Discontinued        750 mg 150 mL/hr over 60 Minutes Intravenous Every 12 hours 01/15/20 1107 01/16/20 1121   01/17/2020 1535  vancomycin (VANCOCIN) powder  Status:  Discontinued          As needed 01/03/2020 1536 01/22/2020 1619   01/10/2020 1400  meropenem (MERREM) 1 g in sodium chloride 0.9 % 100 mL IVPB  Status:  Discontinued        1 g 200 mL/hr over 30 Minutes Intravenous Every 8 hours 12/31/2019 1007 01/25/2020 1800   01/10/2020 1400  levofloxacin (LEVAQUIN) IVPB 500 mg  Status:  Discontinued        500 mg 100 mL/hr over 60 Minutes Intravenous To Surgery 01/16/2020 1354 01/05/2020 1619   01/12/2020 1345  vancomycin (VANCOREADY) IVPB 1500 mg/300 mL  Status:  Discontinued        1,500 mg 150 mL/hr over 120 Minutes Intravenous To Surgery 01/25/2020 1340 01/05/2020 1619   01/26/2020 1345  cefUROXime (ZINACEF) 1.5 g in sodium chloride 0.9 % 100 mL IVPB  Status:  Discontinued        1.5 g 200 mL/hr over 30 Minutes Intravenous To Surgery 01/19/2020 1340 12/29/2019 1353   01/26/2020 1345  cefUROXime (ZINACEF) 750 mg in sodium chloride 0.9 % 100 mL IVPB  Status:  Discontinued        750 mg 200 mL/hr over 30 Minutes Intravenous To Surgery 01/07/2020 1340 01/08/2020 1353   01/02/2020 1002  vancomycin (VANCOCIN) IVPB 1000 mg/200 mL premix  Status:  Discontinued        1,000 mg 200 mL/hr over 60 Minutes Intravenous Every 12 hours 01/23/2020 0948 01/15/20 1049   01/25/2020 1000  levofloxacin (LEVAQUIN) IVPB 750 mg        750 mg 100 mL/hr over 90 Minutes Intravenous Every 24 hours 01/19/2020 1512 01/01/2020 1900   01/03/2020 2130  vancomycin (VANCOCIN) IVPB 1000 mg/200 mL premix        1,000 mg 200 mL/hr over 60 Minutes Intravenous  Once 01/27/2020 1512 01/13/20 1718   01/27/2020 1018  vancomycin (VANCOCIN) powder  Status:  Discontinued           As needed 01/24/2020 1019 01/26/2020 1511   01/25/2020 0400  vancomycin (VANCOREADY) IVPB 1500 mg/300 mL        1,500 mg 150 mL/hr over 120 Minutes Intravenous To Surgery 01/11/20 1357 12/30/2019 1100   01/03/2020 0400  levofloxacin (LEVAQUIN) IVPB 500 mg        500 mg 100 mL/hr over 60 Minutes Intravenous To Surgery 01/11/20 1357 01/23/2020 1000      Assessment/Plan: s/p Procedure(s): Sternal wound debridement APPLICATION OF A-CELL Wound vac change  Sternal wound appears stable, wound VAC in place with good seal noted.  Plan to return to the OR next week with Dr. Marla Roe for additional debridement, wound VAC change, application of wound matrix, possible additional primary closure.  Will need to hold heparin 12 hours prior to surgery.  Working on scheduling either Wednesday or Thursday next week to 03/07/2020 or 03/13/2020.  Will update plan once dates are finalized.      LOS: 3 days    Charlies Constable, PA-C 02/25/2020

## 2020-02-25 NOTE — Progress Notes (Addendum)
Patient ID: Corey Palmer, male   DOB: 1971/01/02, 50 y.o.   MRN: [id number]     Advanced Heart Failure Rounding Note  PCP-Cardiologist: No primary care provider on file.   Subjective:    - 12/7 Torsades -ICD shock x1.  - 12/11 Impella 5.5 placed.  - 12/12 Impella repositioned in OR - 12/15 CABG x 4 with LIMA-LAD, SVG-PDA/PLV, SVG-OM - 12/16 DCCV afib.  Back to OR to re-open chest and again to evacuate hematoma.  - 12/19 CVVH initiated  - 12/23 Chest closed - 12/24 Chest reopened - 12/26 RAP back into NSR - 12/27 back in atrial fibrillation, unable to rapid atrial pace out.   - 12/28 To OR, chest partially closed and wound vac placed.  DCCV to NSR.  - 12/31 Antibiotics stopped. Swan removed, HD catheter moved from right femoral to right IJ.  - 1/5 To OR for chest closure. Chest reopened emergently at bedside again to evacuate hematoma  - 1/10 To OR: omental flap closure of chest with wound vac, tracheostomy, G-tube, J-tube.    - 1/12 Plastics and EP consulted.  - 1/13 Palliative Care consulted.  - 1/15 Went into VT, Shock 200 J and amiodarone bolus 150.   - 1/16: Milrinone stopped.    - 1/17: Partial closure of chest in OR - 1/18 Switched to dobutamine 4 mcg - 1/21 Impella Extracted.  Developed hyperkalemia when CVVH held with wide QRS rhythm and required transient external pacing.   - 1/27 OR for wound vac change, partial chest wall closure.   Co-ox 61% on dobutamine 5, off pressors. Wt down 1 lb, CVP 10-12, CRRT pulling about net 30 at times.   He remains on vancomycin/meropenem/anidulafungin with no growth to date on cultures.   Objective:   Weight Range: 89 kg Body mass index is 24.52 kg/m.   Vital Signs:   Temp:  [96.2 F (35.7 C)-97.8 F (36.6 C)] 97.8 F (36.6 C) (01/28 0400) Pulse Rate:  [44-102] 72 (01/28 0700) Resp:  [0-28] 19 (01/28 0700) BP: (87-123)/(52-88) 109/67 (01/28 0700) SpO2:  [88 %-100 %] 100 % (01/28 0700) Arterial Line BP: (54-118)/(18-56)  115/48 (01/27 1135) FiO2 (%):  [40 %] 40 % (01/28 0400) Weight:  [89 kg] 89 kg (01/28 0500) Last BM Date: 02/01/2020  Weight change: Filed Weights   02/23/20 0457 02/04/2020 0500 02/25/20 0500  Weight: 90 kg 89.5 kg 89 kg    Intake/Output:   Intake/Output Summary (Last 24 hours) at 02/25/2020 0714 Last data filed at 02/25/2020 0700 Gross per 24 hour  Intake 4279.14 ml  Output 4216 ml  Net 63.14 ml      Physical Exam   CVP 10-12 General:  Chronic ill appearing.  HEENT: trach Neck: supple.  Cor:  Regular rate & rhythm. No rubs, gallops or murmurs. JP draing.  Wound vac.  Lungs: Diminished.  Abdomen: Soft, + distended.  Good bowel sounds. Extremities: no cyanosis, clubbing, rash. BUE/BLE dependent position edema present.  Neuro: alert, opens eyes to verbal stimuli, squeezes hands.   Telemetry   SR with rates 70-80s.  Personally reviewed.   Labs    CBC Recent Labs    02/12/2020 0322 02/25/20 0417  WBC 15.9* 13.4*  NEUTROABS 13.2* 11.1*  HGB 9.1* 8.8*  HCT 31.5* 31.4*  MCV 91.6 93.5  PLT 362 987   Basic Metabolic Panel Recent Labs    02/21/2020 0322 02/21/2020 1537 02/25/20 0417  NA 135 136 133*  K 3.8 3.8 3.5  CL 104  103 101  CO2 _0 GLUCOSE 77 162* 138*  BUN 40* 37* 38*  CREATININE 0.98 0.94 0.95  CALCIUM 8.1* 7.9* 7.9*  MG 2.4  --  2.3  PHOS 2.4* 3.2 2.4*   Liver Function Tests Recent Labs    02/10/2020 1537 02/25/20 0417  ALBUMIN 2.1* 1.9*   No results for input(s): LIPASE, AMYLASE in the last 72 hours. Cardiac Enzymes No results for input(s): CKTOTAL, CKMB, CKMBINDEX, TROPONINI in the last 72 hours.  BNP: BNP (last 3 results) Recent Labs    01/07/2020 1619  BNP 577.5*    ProBNP (last 3 results) No results for input(s): PROBNP in the last 8760 hours.   D-Dimer No results for input(s): DDIMER in the last 72 hours. Hemoglobin A1C No results for input(s): HGBA1C in the last 72 hours. Fasting Lipid Panel No results for input(s): CHOL,  HDL, LDLCALC, TRIG, CHOLHDL, LDLDIRECT in the last 72 hours. Thyroid Function Tests No results for input(s): TSH, T4TOTAL, T3FREE, THYROIDAB in the last 72 hours.  Invalid input(s): FREET3  Other results:   Imaging    No results found.   Medications:     Scheduled Medications: . sodium chloride   Intravenous Once  . amiodarone  200 mg Oral BID  . vitamin C  500 mg Per Tube TID  . aspirin  324 mg Per Tube Daily  . atorvastatin  80 mg Per Tube Daily  . busPIRone  10 mg Per Tube TID  . chlorhexidine gluconate (MEDLINE KIT)  15 mL Mouth Rinse BID  . Chlorhexidine Gluconate Cloth  6 each Topical Daily  . darbepoetin (ARANESP) injection - NON-DIALYSIS  100 mcg Subcutaneous Q Sat-1800  . feeding supplement (PROSource TF)  45 mL Per Tube TID  . fiber  1 packet Per Tube BID  . FLUoxetine  40 mg Per Tube BID  . hydrocortisone sod succinate (SOLU-CORTEF) inj  25 mg Intravenous Q12H  . HYDROmorphone  4 mg Per Tube Q6H  . insulin aspart  0-9 Units Subcutaneous Q4H  . insulin aspart  4 Units Subcutaneous Q4H  . insulin detemir  12 Units Subcutaneous Q12H  . LORazepam  1 mg Intravenous Q4H  . mouth rinse  15 mL Mouth Rinse 10 times per day  . melatonin  5 mg Per Tube Daily  . mexiletine  150 mg Per Tube Q8H  . midodrine  20 mg Per Tube TID WC  . multivitamin  1 tablet Per Tube QHS  . nutrition supplement (JUVEN)  1 packet Per Tube BID BM  . pantoprazole (PROTONIX) IV  40 mg Intravenous Q12H  . sodium chloride flush  10-40 mL Intracatheter Q12H  . thiamine  100 mg Per Tube Daily  . valproic acid  250 mg Per Tube BID    Infusions: .  prismasol BGK 4/2.5 500 mL/hr at 01/30/2020 2130  .  prismasol BGK 4/2.5 200 mL/hr at 02/25/20 0140  . sodium chloride 10 mL/hr at 02/25/20 0700  . sodium chloride    . anidulafungin Stopped (02/01/2020 2213)  . dexmedetomidine (PRECEDEX) IV infusion 0.7 mcg/kg/hr (02/25/20 0700)  . dextrose Stopped (02/22/2020 1846)  . DOBUTamine 5 mcg/kg/min (02/25/20  0700)  . feeding supplement (PIVOT 1.5 CAL) 70 mL/hr at 02/25/20 0545  . heparin 1,500 Units/hr (02/25/20 0700)  . HYDROmorphone 2 mg/hr (02/25/20 0700)  . lactated ringers 10 mL/hr at 02/25/20 0700  . meropenem (MERREM) IV Stopped (02/25/20 0540)  . prismasol BGK 4/2.5 1,500 mL/hr at 02/25/20 0442  .  vancomycin Stopped (02/14/2020 1651)    PRN Medications: sodium chloride, Place/Maintain arterial line **AND** sodium chloride, artificial tears, bisacodyl **OR** bisacodyl, dextrose, docusate, heparin, heparin, HYDROmorphone, lip balm, loperamide HCl, metoprolol tartrate, midazolam, polyethylene glycol, sodium chloride flush     Assessment/Plan   1. Cardiogenic shock/acute on chronic systolic CHF: Initially nonischemic cardiomyopathy.  Possible familial cardiomyopathy as both parents had cardiomyopathy and died at around 55. However, [employer] gene testing did not show any common mutation for cardiomyopathy.  However, this admission noted to have severe 3 vessel disease so suspect component of ischemic cardiomyopathy.  [employer] Jude ICD.  Echo in 8/20 with EF 15% and mildly decreased RV function. Echo this admission with EF < 20%, moderate LV dilation, RV mildly reduced, severe LAE, no significant MR. Low output HF with markedly low EF.  He has a long history of cardiomyopathy (20 yrs), tends to minimize symptoms.  Cardiorenal syndrome with creatinine up to 2.2,  stabilized on milrinone and Impella 5.5. SCr 1.44 day of CABG. CABG 12/15.  Post-op shock, back to OR 12/16 to open chest (chest wall compartment syndrome) and again to evacuate hematoma.  TEE post-op with severe RV dysfunction.  CVVHD initiated 12/19 for volume removal in setting of rising Scr/decreased UOP. Suspect ATN. S/p chest closure 12/23. Hemodynamics much worse on 12/24 with rising pressor demands. Co-ox 36%.  Bedside echo severe biventricular dysfunction with marked septal bounce. Chest re-opened 12/24.  Was atrial paced back to NSR on 12/26  with improvement in hemodynamics but back in atrial fibrillation on 12/27 and unable to pace out, DCCV to NSR on 12/28. Went back to OR 1/5 for chest closure but several hours later required emergent re-opening of chest at bedside to evacuate hematoma. To OR again 1/10 with closure of chest with omental flap with G & J tube.  1/17 partial closure. 1/21 Impella removed.  Off CVVH post-removal, he developed hyperkalemia and wide QRS rhythm requiring transient external pacing, also with suspected septic shock (low SVR, fever, rising WBCs).  Now off pressors and on dobutamine gtt 5, co-ox 61%.  CVP 1-12, ongoing CVVH.  - Will continue dobutamine 5, think he will need this chronically.  - Continues on midodrine 20 TID.  -  Nephro unsure if he would be able to tolerate iHD. Once CRRT machine clots off or stops, hold and monitor him off CRRT.  - Not candidate to replace Impella (at this point, not candidate for LVAD or transplant). He could stay on dobutamine long-term.  2. Atrial fibrillation: H/o PAF.  He was on dofetilide in the remote past but this was stopped due to noncompliance.  He had an upper GI bleed from antral ulcers in 3/12. He was seen by GI and was deemed safe to restart anticoagulation as long as he remains on a PPI.  No apparent recurrence of AF until just prior to this admission, was cardioverted back to NSR in ER but went back into atrial fibrillation.  Post-op afib, DCCV to NSR/BiV pacing am 12/16 -> back to AF. Rapid atrial pacing back to SR. Maintaining NSR.  - Continue amiodarone per tube 200 mg bid.  - Continue heparin gtt.   3. CAD:   Cath this admission with severe 3 vessel disease. CABG x 4 on 12/15.  - Continue atorvastatin, ASA.  4. Acute on chronic hypoxemic respiratory failure: Vent per CCM.  Has tracheostomy.  - CCM following, will need to work on slowly weaning vent.  5. AKI on CKD stage 3:  Likely ATN/cardiorenal. Anuric. On CVVH.    - Nephro recommendations appreciated. Will  see how he does off CRRT.  6. Diabetes: Insulin.  7. Hyponatremia: Resolved with CVVH.  8. Torsades/ICD shock: 12/7/212 in hospital event, in setting of severe hypokalemia and hypomagnesemia.   9. Gout: h/o gout. Complained of rt knee pain c/w previous flares - treated w/ prednisone burst (completed).  10: ID: Septic shock 1/21 with fever, low SVR, and elevated WBCs. Cultures no growth with exception of axillary cx on 1/21 grew staph epidermidis.  IJ catheter removed.  - On vancomycin/meropenem, anidulafungin.  - c/w hydrocortisone to 25 q 12 hrs.  11. FEN: c/w TFs.  12. Anemia: Hgb 8.8 today.  Transfuse < 7.5.   13. Stage II Pressure Ulcer- sacrum: Continue to reposition every 2 hours R/L  14. Pleural Effusion: Loculated rt apical pleural effusion noted on imaging. Continue UF through CVVH.  PCCM following  15. VT: Amiodarone + mexiletine.   16. Deconditioning: Profound. Work with PT, nursing to work on range of motion.   Laura E Liggett 02/25/2020 7:14 AM  Patient seen with NP, agree with the above note.   Stable today, dobutamine 5 continues.  CVP around 10, CVVH pulling around net 50 cc/hr.  OR yesterday uneventful (wound vac change, partial chest wall closure.   General: Sedated on vent Neck: No JVD, no thyromegaly or thyroid nodule.  Lungs: Decreased at bases.  CV: Nondisplaced PMI.  Heart regular S1/S2, no S3/S4, no murmur.  1+ ankle edema.    Abdomen: Soft, nontender, no hepatosplenomegaly, no distention.  Skin: Intact without lesions or rashes.  Neurologic: Wakes up appropriately. Extremities: No clubbing or cyanosis.  HEENT: Normal.   When he reaches a natural break (filter needs changing, etc), will stop CVVH.  Will then need to time iHD with renal.  Have discussed with his wife, will need to show that he can tolerate iHD to be candidate for LTACH.  If he cannot, will need to start thinking about comfort care.   Afebrile, WBCs lower.  Continue meropenem and Eraxis x 7  days and vancomycin x 10 days.   Remains in NSR on po amiodarone. Heparin gtt for atrial fibrillation.   Range of motion, PT.   CRITICAL CARE Performed by:    Total critical care time: 35 minutes  Critical care time was exclusive of separately billable procedures and treating other patients.  Critical care was necessary to treat or prevent imminent or life-threatening deterioration.  Critical care was time spent personally by me on the following activities: development of treatment plan with patient and/or surrogate as well as nursing, discussions with consultants, evaluation of patient's response to treatment, examination of patient, obtaining history from patient or surrogate, ordering and performing treatments and interventions, ordering and review of laboratory studies, ordering and review of radiographic studies, pulse oximetry and re-evaluation of patient's condition.    02/25/2020 8:19 AM   

## 2020-02-25 NOTE — Progress Notes (Signed)
1 Day Post-Op Procedure(s) (LRB): Sternal wound debridement (N/A) APPLICATION OF A-CELL (N/A) Wound vac change (N/A) Subjective:  Sedated on vent   Remains stable on dobut 5 mcg with Co-ox of 61.4.  Had wound VAC change and partial closure of chest wound by Plastic Surgery yesterday.  Objective: Vital signs in last 24 hours: Temp:  [96.2 F (35.7 C)-97.8 F (36.6 C)] 97.3 F (36.3 C) (01/28 0731) Pulse Rate:  [65-102] 72 (01/28 0700) Cardiac Rhythm: Normal sinus rhythm;Heart block (01/28 0400) Resp:  [0-28] 19 (01/28 0700) BP: (87-123)/(52-88) 109/67 (01/28 0700) SpO2:  [88 %-100 %] 100 % (01/28 0700) Arterial Line BP: (54-115)/(18-48) 115/48 (01/27 1135) FiO2 (%):  [40 %] 40 % (01/28 0731) Weight:  [89 kg] 89 kg (01/28 0500)  Hemodynamic parameters for last 24 hours: CVP:  [0 mmHg-34 mmHg] 7 mmHg  Intake/Output from previous day: 01/27 0701 - 01/28 0700 In: 4279.1 [I.V.:2133.6; NG/GT:895.5; IV Piggyback:630] Out: 6384 [Drains:310; Stool:800; Blood:10] Intake/Output this shift: No intake/output data recorded.  General appearance: trached on vent Neurologic: awakens and follows commands per nurses Heart: regular rate and rhythm Lungs: clear to auscultation bilaterally Abdomen: soft, bowel sounds normal Extremities: edema mild Wound: VAC in place  Lab Results: Recent Labs    02/20/2020 0322 02/25/20 0417  WBC 15.9* 13.4*  HGB 9.1* 8.8*  HCT 31.5* 31.4*  PLT 362 332   BMET:  Recent Labs    02/23/2020 1537 02/25/20 0417  NA 136 133*  K 3.8 3.5  CL 103 101  CO2 24 24  GLUCOSE 162* 138*  BUN 37* 38*  CREATININE 0.94 0.95  CALCIUM 7.9* 7.9*    PT/INR: No results for input(s): LABPROT, INR in the last 72 hours. ABG    Component Value Date/Time   PHART 7.400 02/23/2020 0523   HCO3 25.7 02/23/2020 0523   TCO2 27 02/23/2020 0523   ACIDBASEDEF 1.0 02/21/2020 0625   O2SAT 61.4 02/25/2020 0417   CBG (last 3)  Recent Labs    02/25/20 0119 02/25/20 0424  02/25/20 0801  GLUCAP 157* 133* 184*    Assessment/Plan: S/P Procedure(s) (LRB): Sternal wound debridement (N/A) APPLICATION OF A-CELL (N/A) Wound vac change (N/A)  He has been hemodynamically stable on dobut 5 with adequate Co-ox. Plans per heart failure team.   Remains on vent via trach. 40% FiO2. CCM following.  Stable on CRRT with -50/hr. Plan is to switch to intermittent HD when filter clots.  Tolerating tube feeds.  Continue VAC dressing to chest.  Will discuss chest tube and JP removal with Dr. [name].   LOS: 57 days    Gaye Pollack 02/25/2020

## 2020-02-25 NOTE — Progress Notes (Signed)
PT Cancellation Note  Patient Details Name: Corey Palmer MRN: [id number] DOB: [birth year]   Cancelled Treatment:    Reason Eval/Treat Not Completed: (P) Medical issues which prohibited therapy (Pt remains limited with progression of mobility due to CRRT catheter placement in groin.  OT into perform passive ROM,  PT to follow up next week.)   Shoni Quijas Eli Hose 02/25/2020, 4:13 PM  Erasmo Leventhal , PTA Acute Rehabilitation Services Pager [phone] Office [phone]

## 2020-02-25 NOTE — Progress Notes (Signed)
Pharmacy Antibiotic Note  Corey Palmer is a 50 y.o. male admitted on 01/07/2020 s/p CABG with Impella and open chest to continue on broad spectrum antibiotics for sepsis secondary to HAP.  Cultures have been unrevealing, planning eraxis and meropenem x7 days total and vancomycin x10 days. CRRT continues with plans to transition to iHD once circuit clots.  Plan: Continue vanc 1gm IV Q24H through 1/31 Merrem 1gm IV Q8H through 1/28 Eraxis 100mg  IV Q24H through 1/28 F/U RRT plans    Height: 6\' 3"  (190.5 cm) Weight: 89 kg (196 lb 3.4 oz) IBW/kg (Calculated) : 84.5  Temp (24hrs), Avg:97.3 F (36.3 C), Min:96.2 F (35.7 C), Max:97.8 F (36.6 C)  Recent Labs  Lab 02/16/2020 1704 02/27/2020 2346 02/20/20 0310 02/20/20 1144 02/21/20 0420 02/21/20 1502 02/22/20 0415 02/22/20 1411 02/22/20 1531 02/23/20 0347 02/23/20 1551 02/05/2020 0322 02/06/2020 1537 02/25/20 0417  WBC  --    < > 22.5*  --  16.2*  --  16.3*  --   --  17.4*  --  15.9*  --  13.4*  CREATININE 1.76*   < > 1.15   < > 1.16   < > 1.16   < >  --  1.04 1.03 0.98 0.94 0.95  LATICACIDVEN 3.1*  --  1.3  --   --   --   --   --   --   --   --   --   --   --   VANCOTROUGH  --   --   --   --   --   --   --   --  18  --   --   --   --   --    < > = values in this interval not displayed.    Estimated Creatinine Clearance: 109.9 mL/min (by C-G formula based on SCr of 0.95 mg/dL).    Allergies  Allergen Reactions  . Orange Fruit Anaphylaxis, Hives and Other (See Comments)    Per Pt- Blisters around lips and Hives all over, also  . Penicillins Anaphylaxis    Did it involve swelling of the face/tongue/throat, SOB, or low BP? Yes Did it involve sudden or severe rash/hives, skin peeling, or any reaction on the inside of your mouth or nose? Yes Did you need to seek medical attention at a hospital or doctor's office? No When did it last happen?childhood If all above answers are "NO", may proceed with cephalosporin use.  [name]  [Empagliflozin] Itching  . [name] [Insulin Glargine] Nausea And Vomiting    Vanc 12/15 >> 12/31, restart 1/4>1/12, restart 1/21 >> (1/31) Meropenem 12/16 >>12/31, restart 1/4 >1/12, restart 1/21 >> 1/28 Eraxis 1/22 >> 1/28 Nystatin 12/22 for oral thrush>12/30  1/25 VT = 18 mcg/mL on 1g q24 >> no change (CRRT)  12/14 Bcx neg 12/16 Bcx neg MRSA PCR neg  12/20 TA rare yeast  12/23 sternal wound neg 1/4 Bcx: neg 1/4 TA - rare candida albicans 1/9 TA - rare yeast- reincubated 1/19 BCx: ngtd  1/21 TA: no orgs  1/21 Impella graft cx: rare staph epidermidis   1/21 BCx: ngtd 1/23 fungal cx: ngtd    [name], PharmD, BCPS, [facility] Clinical Pharmacist [phone] Please check AMION for all [facility] Pharmacy numbers 02/25/2020

## 2020-02-26 DIAGNOSIS — J9601 Acute respiratory failure with hypoxia: Secondary | ICD-10-CM | POA: Diagnosis not present

## 2020-02-26 DIAGNOSIS — R570 Cardiogenic shock: Secondary | ICD-10-CM | POA: Diagnosis not present

## 2020-02-26 DIAGNOSIS — N179 Acute kidney failure, unspecified: Secondary | ICD-10-CM | POA: Diagnosis not present

## 2020-02-26 LAB — RENAL FUNCTION PANEL
Albumin: 1.9 g/dL — ABNORMAL LOW (ref 3.5–5.0)
Albumin: 2 g/dL — ABNORMAL LOW (ref 3.5–5.0)
Anion gap: 7 (ref 5–15)
Anion gap: 7 (ref 5–15)
BUN: 41 mg/dL — ABNORMAL HIGH (ref 6–20)
BUN: 42 mg/dL — ABNORMAL HIGH (ref 6–20)
CO2: 24 mmol/L (ref 22–32)
CO2: 24 mmol/L (ref 22–32)
Calcium: 7.9 mg/dL — ABNORMAL LOW (ref 8.9–10.3)
Calcium: 7.9 mg/dL — ABNORMAL LOW (ref 8.9–10.3)
Chloride: 100 mmol/L (ref 98–111)
Chloride: 102 mmol/L (ref 98–111)
Creatinine, Ser: 0.99 mg/dL (ref 0.61–1.24)
Creatinine, Ser: 1 mg/dL (ref 0.61–1.24)
GFR, Estimated: >60 mL/min (ref >60)
GFR, Estimated: >60 mL/min (ref >60)
Glucose, Bld: 156 mg/dL — ABNORMAL HIGH (ref 70–99)
Glucose, Bld: 138 mg/dL — ABNORMAL HIGH (ref 70–99)
Phosphorus: 2.3 mg/dL — ABNORMAL LOW (ref 2.5–4.6)
Phosphorus: 2.3 mg/dL — ABNORMAL LOW (ref 2.5–4.6)
Potassium: 3.8 mmol/L (ref 3.5–5.1)
Potassium: 4 mmol/L (ref 3.5–5.1)
Sodium: 131 mmol/L — ABNORMAL LOW (ref 135–145)
Sodium: 133 mmol/L — ABNORMAL LOW (ref 135–145)

## 2020-02-26 LAB — CBC WITH DIFFERENTIAL/PLATELET
Abs Immature Granulocytes: 0.23 10*3/uL — ABNORMAL HIGH (ref 0.00–0.07)
Basophils Absolute: 0 10*3/uL (ref 0.0–0.1)
Basophils Relative: 0 %
Eosinophils Absolute: 0.2 10*3/uL (ref 0.0–0.5)
Eosinophils Relative: 1 %
HCT: 31 % — ABNORMAL LOW (ref 39.0–52.0)
Hemoglobin: 8.9 g/dL — ABNORMAL LOW (ref 13.0–17.0)
Immature Granulocytes: 2 %
Lymphocytes Relative: 7 %
Lymphs Abs: 1.1 10*3/uL (ref 0.7–4.0)
MCH: 26.8 pg (ref 26.0–34.0)
MCHC: 28.7 g/dL — ABNORMAL LOW (ref 30.0–36.0)
MCV: 93.4 fL (ref 80.0–100.0)
Monocytes Absolute: 1.1 10*3/uL — ABNORMAL HIGH (ref 0.1–1.0)
Monocytes Relative: 7 %
Neutro Abs: 12.9 10*3/uL — ABNORMAL HIGH (ref 1.7–7.7)
Neutrophils Relative %: 83 %
Platelets: 321 10*3/uL (ref 150–400)
RBC: 3.32 MIL/uL — ABNORMAL LOW (ref 4.22–5.81)
RDW: 19.9 % — ABNORMAL HIGH (ref 11.5–15.5)
WBC: 15.5 10*3/uL — ABNORMAL HIGH (ref 4.0–10.5)
nRBC: 0 % (ref 0.0–0.2)

## 2020-02-26 LAB — COOXEMETRY PANEL
Carboxyhemoglobin: 1.9 % — ABNORMAL HIGH (ref 0.5–1.5)
Methemoglobin: 0.9 % (ref 0.0–1.5)
O2 Saturation: 60.5 %
Total hemoglobin: 9 g/dL — ABNORMAL LOW (ref 12.0–16.0)

## 2020-02-26 LAB — GLUCOSE, CAPILLARY
Glucose-Capillary: 128 mg/dL — ABNORMAL HIGH (ref 70–99)
Glucose-Capillary: 144 mg/dL — ABNORMAL HIGH (ref 70–99)
Glucose-Capillary: 161 mg/dL — ABNORMAL HIGH (ref 70–99)
Glucose-Capillary: 162 mg/dL — ABNORMAL HIGH (ref 70–99)
Glucose-Capillary: 174 mg/dL — ABNORMAL HIGH (ref 70–99)
Glucose-Capillary: 184 mg/dL — ABNORMAL HIGH (ref 70–99)
Glucose-Capillary: 194 mg/dL — ABNORMAL HIGH (ref 70–99)

## 2020-02-26 LAB — HEPARIN LEVEL (UNFRACTIONATED)
Heparin Unfractionated: 0.23 IU/mL — ABNORMAL LOW (ref 0.30–0.70)
Heparin Unfractionated: 0.27 IU/mL — ABNORMAL LOW (ref 0.30–0.70)

## 2020-02-26 LAB — FUNGITELL, SERUM: Fungitell Result: 433 pg/mL — ABNORMAL HIGH (ref <80)

## 2020-02-26 LAB — MAGNESIUM: Magnesium: 2.3 mg/dL (ref 1.7–2.4)

## 2020-02-26 MED ORDER — CHLORHEXIDINE GLUCONATE 0.12 % MT SOLN
OROMUCOSAL | Status: AC
  Administered 2020-02-26: 15 mL via OROMUCOSAL
  Filled 2020-02-26: qty 15

## 2020-02-26 MED ORDER — NYSTATIN 100000 UNIT/ML MT SUSP
5.0000 mL | Freq: Four times a day (QID) | OROMUCOSAL | Status: DC | PRN
  Administered 2020-02-27 – 2020-02-28 (×2): 500000 [IU] via ORAL
  Filled 2020-02-26 (×3): qty 5

## 2020-02-26 NOTE — Progress Notes (Signed)
Patient ID: Corey Palmer, male   DOB: [birth year], 50 y.o.   MRN: [id number]     Advanced Heart Failure Rounding Note  PCP-Cardiologist: No primary care provider on file.   Subjective:    - 12/7 Torsades -ICD shock x1.  - 12/11 Impella 5.5 placed.  - 12/12 Impella repositioned in OR - 12/15 CABG x 4 with LIMA-LAD, SVG-PDA/PLV, SVG-OM - 12/16 DCCV afib.  Back to OR to re-open chest and again to evacuate hematoma.  - 12/19 CVVH initiated  - 12/23 Chest closed - 12/24 Chest reopened - 12/26 RAP back into NSR - 12/27 back in atrial fibrillation, unable to rapid atrial pace out.   - 12/28 To OR, chest partially closed and wound vac placed.  DCCV to NSR.  - 12/31 Antibiotics stopped. Swan removed, HD catheter moved from right femoral to right IJ.  - 1/5 To OR for chest closure. Chest reopened emergently at bedside again to evacuate hematoma  - 1/10 To OR: omental flap closure of chest with wound vac, tracheostomy, G-tube, J-tube.    - 1/12 Plastics and EP consulted.  - 1/13 Palliative Care consulted.  - 1/15 Went into VT, Shock 200 J and amiodarone bolus 150.   - 1/16: Milrinone stopped.    - 1/17: Partial closure of chest in OR - 1/18 Switched to dobutamine 4 mcg - 1/21 Impella Extracted.  Developed hyperkalemia when CVVH held with wide QRS rhythm and required transient external pacing.   - 1/27 OR for wound vac change, partial chest wall closure.   Co-ox stable at 61% on dobutamine 5, off pressors. Remains on CVVHD.   On vent through trach. Awake following commands  He remains on vancomycin/meropenem/anidulafungin with no growth to date on cultures.   Objective:   Weight Range: 88.7 kg Body mass index is 24.44 kg/m.   Vital Signs:   Temp:  [97.1 F (36.2 C)-97.7 F (36.5 C)] 97.7 F (36.5 C) (01/29 0400) Pulse Rate:  [62-91] 87 (01/29 1000) Resp:  [8-27] 12 (01/29 1000) BP: (89-117)/(54-81) 109/70 (01/29 0930) SpO2:  [100 %] 100 % (01/29 1000) FiO2 (%):  [30 %-40 %] 30  % (01/29 0755) Weight:  [88.7 kg] 88.7 kg (01/29 0500) Last BM Date: 02/25/20  Weight change: Filed Weights   02/20/2020 0500 02/25/20 0500 02/26/20 0500  Weight: 89.5 kg 89 kg 88.7 kg    Intake/Output:   Intake/Output Summary (Last 24 hours) at 02/26/2020 1113 Last data filed at 02/26/2020 1100 Gross per 24 hour  Intake 4094.1 ml  Output 5755 ml  Net -1660.9 ml      Physical Exam   General:  Ill appearing. Awake on vent HEENT: normal Neck: supple. + trach. + trialysis cath Carotids 2+ bilat; no bruits. No lymphadenopathy or thryomegaly appreciated. Cor: PMI nondisplaced. Regular rate & rhythm. + wound vac Lungs: clear Abdomen: soft, nontender, nondistended. No hepatosplenomegaly. No bruits or masses. Good bowel sounds. Extremities: no cyanosis, clubbing, rash, tr edema Neuro: awake follows commands  Telemetry   SR 80s Personally reviewed  Labs    CBC Recent Labs    02/25/20 0417 02/26/20 0402  WBC 13.4* 15.5*  NEUTROABS 11.1* 12.9*  HGB 8.8* 8.9*  HCT 31.4* 31.0*  MCV 93.5 93.4  PLT 332 390   Basic Metabolic Panel Recent Labs    02/25/20 0417 02/25/20 1600 02/26/20 0402  NA 133* 135 131*  K 3.5 3.8 3.8  CL 101 103 100  CO2 '24 24 24  ' GLUCOSE 138* 175*  156*  BUN 38* 40* 41*  CREATININE 0.95 0.97 0.99  CALCIUM 7.9* 7.9* 7.9*  MG 2.3  --  2.3  PHOS 2.4* 2.5 2.3*   Liver Function Tests Recent Labs    02/25/20 1600 02/26/20 0402  ALBUMIN 2.1* 1.9*   No results for input(s): LIPASE, AMYLASE in the last 72 hours. Cardiac Enzymes No results for input(s): CKTOTAL, CKMB, CKMBINDEX, TROPONINI in the last 72 hours.  BNP: BNP (last 3 results) Recent Labs    01/14/2020 1619  BNP 577.5*    ProBNP (last 3 results) No results for input(s): PROBNP in the last 8760 hours.   D-Dimer No results for input(s): DDIMER in the last 72 hours. Hemoglobin A1C No results for input(s): HGBA1C in the last 72 hours. Fasting Lipid Panel No results for input(s):  CHOL, HDL, LDLCALC, TRIG, CHOLHDL, LDLDIRECT in the last 72 hours. Thyroid Function Tests No results for input(s): TSH, T4TOTAL, T3FREE, THYROIDAB in the last 72 hours.  Invalid input(s): FREET3  Other results:   Imaging    No results found.   Medications:     Scheduled Medications: . sodium chloride   Intravenous Once  . amiodarone  200 mg Per Tube BID  . vitamin C  500 mg Per Tube TID  . aspirin  324 mg Per Tube Daily  . atorvastatin  80 mg Per Tube Daily  . busPIRone  10 mg Per Tube TID  . chlorhexidine gluconate (MEDLINE KIT)  15 mL Mouth Rinse BID  . Chlorhexidine Gluconate Cloth  6 each Topical Daily  . darbepoetin (ARANESP) injection - NON-DIALYSIS  100 mcg Subcutaneous Q Sat-1800  . feeding supplement (PROSource TF)  45 mL Per Tube TID  . fiber  1 packet Per Tube BID  . FLUoxetine  40 mg Per Tube BID  . hydrocortisone sod succinate (SOLU-CORTEF) inj  25 mg Intravenous Q12H  . HYDROmorphone  4 mg Per Tube Q6H  . insulin aspart  0-9 Units Subcutaneous Q4H  . insulin aspart  4 Units Subcutaneous Q4H  . insulin detemir  12 Units Subcutaneous Q12H  . LORazepam  1 mg Intravenous Q4H  . mouth rinse  15 mL Mouth Rinse 10 times per day  . melatonin  5 mg Per Tube Daily  . mexiletine  150 mg Per Tube Q8H  . midodrine  20 mg Per Tube TID WC  . multivitamin  1 tablet Per Tube QHS  . nutrition supplement (JUVEN)  1 packet Per Tube BID BM  . pantoprazole (PROTONIX) IV  40 mg Intravenous Q12H  . sodium chloride flush  10-40 mL Intracatheter Q12H  . thiamine  100 mg Per Tube Daily  . valproic acid  250 mg Per Tube BID    Infusions: .  prismasol BGK 4/2.5 500 mL/hr at 02/26/20 0538  .  prismasol BGK 4/2.5 200 mL/hr at 02/26/20 0349  . sodium chloride Stopped (02/26/20 0042)  . sodium chloride    . dexmedetomidine (PRECEDEX) IV infusion 0.4 mcg/kg/hr (02/26/20 1100)  . dextrose Stopped (02/01/2020 1846)  . DOBUTamine 5 mcg/kg/min (02/26/20 1100)  . feeding supplement  (PIVOT 1.5 CAL) 1,000 mL (02/26/20 0155)  . heparin 1,700 Units/hr (02/26/20 1100)  . HYDROmorphone 2 mg/hr (02/26/20 1100)  . lactated ringers 20 mL/hr at 02/26/20 1100  . prismasol BGK 4/2.5 1,500 mL/hr at 02/26/20 0844  . vancomycin Stopped (02/25/20 1621)    PRN Medications: sodium chloride, Place/Maintain arterial line **AND** sodium chloride, artificial tears, bisacodyl **OR** bisacodyl, dextrose, docusate, heparin,  heparin, HYDROmorphone, lip balm, loperamide HCl, metoprolol tartrate, midazolam, nystatin, polyethylene glycol, sodium chloride flush     Assessment/Plan   1. Cardiogenic shock/acute on chronic systolic CHF: Initially nonischemic cardiomyopathy.  Possible familial cardiomyopathy as both parents had cardiomyopathy and died at around 26. However, [employer] gene testing did not show any common mutation for cardiomyopathy.  However, this admission noted to have severe 3 vessel disease so suspect component of ischemic cardiomyopathy.  St Jude ICD.  Echo in 8/20 with EF 15% and mildly decreased RV function. Echo this admission with EF < 20%, moderate LV dilation, RV mildly reduced, severe LAE, no significant MR. Low output HF with markedly low EF.  He has a long history of cardiomyopathy (20 yrs), tends to minimize symptoms.  Cardiorenal syndrome with creatinine up to 2.2,  stabilized on milrinone and Impella 5.5. SCr 1.44 day of CABG. CABG 12/15.  Post-op shock, back to OR 12/16 to open chest (chest wall compartment syndrome) and again to evacuate hematoma.  TEE post-op with severe RV dysfunction.  CVVHD initiated 12/19 for volume removal in setting of rising Scr/decreased UOP. Suspect ATN. S/p chest closure 12/23. Hemodynamics much worse on 12/24 with rising pressor demands. Co-ox 36%.  Bedside echo severe biventricular dysfunction with marked septal bounce. Chest re-opened 12/24.  Was atrial paced back to NSR on 12/26 with improvement in hemodynamics but back in atrial fibrillation on  12/27 and unable to pace out, DCCV to NSR on 12/28. Went back to OR 1/5 for chest closure but several hours later required emergent re-opening of chest at bedside to evacuate hematoma. To OR again 1/10 with closure of chest with omental flap with G & J tube.  1/17 partial closure. 1/21 Impella removed.  Off CVVH post-removal, he developed hyperkalemia and wide QRS rhythm requiring transient external pacing, also with suspected septic shock (low SVR, fever, rising WBCs).  Now off pressors and on dobutamine gtt 5, co-ox 61%.  CVP 9-10 ongoing CVVH.  - Will continue dobutamine 5, think he will need this chronically. Unable to wean.  - Continues on midodrine 20 TID.  -  Nephro unsure if he would be able to tolerate iHD. Once CRRT machine clots off or stops, hold and monitor him off CRRT and will try iHD - Not candidate to replace Impella (at this point, not candidate for LVAD or transplant). He could stay on dobutamine long-term.  - Dr. Aundra Dubin has discussed with his wife, will need to show that he can tolerate iHD to be candidate for [facility].  If he cannot, will need to start thinking about comfort care.  2. Atrial fibrillation: H/o PAF.  He was on dofetilide in the remote past but this was stopped due to noncompliance.  He had an upper GI bleed from antral ulcers in 3/12. He was seen by GI and was deemed safe to restart anticoagulation as long as he remains on a PPI.  No apparent recurrence of AF until just prior to this admission, was cardioverted back to NSR in ER but went back into atrial fibrillation.  Post-op afib, DCCV to NSR/BiV pacing am 12/16 -> back to AF. Rapid atrial pacing back to SR. Remains in NSR - Continue amiodarone per tube 200 mg bid.  - Continue heparin gtt.   3. CAD:   Cath this admission with severe 3 vessel disease. CABG x 4 on 12/15.  - Continue atorvastatin, ASA.  - No s/s angina 4. Acute on chronic hypoxemic respiratory failure: Vent per CCM.  Has tracheostomy.  - CCM following,  will need to work on slowly weaning vent.  5. AKI on CKD stage 3: Likely ATN/cardiorenal. Anuric. On CVVH.    - Nephro recommendations appreciated. Remains on CVVHD. When filter clots try iHD  6. Diabetes: Insulin.  7. Hyponatremia: Resolved with CVVH.  8. Torsades/ICD shock: 12/7/212 in hospital event, in setting of severe hypokalemia and hypomagnesemia.   9. Gout: h/o gout. Complained of rt knee pain c/w previous flares - treated w/ prednisone burst (completed).  10: ID: Septic shock 1/21 with fever, low SVR, and elevated WBCs. Cultures no growth with exception of axillary cx on 1/21 grew staph epidermidis.  IJ catheter removed.  - On vancomycin/meropenem, anidulafungin.  - c/w hydrocortisone to 25 q 12 hrs.  11. FEN: c/w TFs.  12. Anemia: Hgb stable at 8.9  today.  Transfuse < 7.5.   13. Stage II Pressure Ulcer- sacrum: Continue to reposition every 2 hours R/L  14. Pleural Effusion: Loculated rt apical pleural effusion noted on imaging. Continue UF through CVVH.  PCCM following  15. VT: Amiodarone + mexiletine.   16. Deconditioning: Profound. Work with PT, nursing to work on range of motion.   CRITICAL CARE Performed by: Glori Bickers  Total critical care time: 35 minutes  Critical care time was exclusive of separately billable procedures and treating other patients.  Critical care was necessary to treat or prevent imminent or life-threatening deterioration.  Critical care was time spent personally by me (independent of midlevel providers or residents) on the following activities: development of treatment plan with patient and/or surrogate as well as nursing, discussions with consultants, evaluation of patient's response to treatment, examination of patient, obtaining history from patient or surrogate, ordering and performing treatments and interventions, ordering and review of laboratory studies, ordering and review of radiographic studies, pulse oximetry and re-evaluation of  patient's condition.    Quillian Quince Elisha Mcgruder 02/26/2020 11:13 AM

## 2020-02-26 NOTE — Progress Notes (Signed)
NAME:  Corey Palmer, MRN:  [id number], DOB:  [birth year], LOS: 72 ADMISSION DATE:  01/13/2020, CONSULTATION DATE: 01/12/2020 REFERRING MD: Aundra Dubin CHIEF COMPLAINT: Respiratory failure post Impella  HPI/Course in hospital  50 year old man admitted 12/1 with A. fib/RVR, torsades, cardiogenic shock [EF 15] requiring Impella placement (removed 1/21), CABG x 4 c/b cardiac tamponade requiring chest reopening (now with VAC), AKI requiring on CRRT.  Past Medical History:    has a past medical history of Anginal pain ([place]), Anxiety, Arthritis, Automatic implantable cardioverter-defibrillator in situ (2007), Bipolar disorder ([place]), CAD (coronary artery disease), Cancer ([place]) (2013), CHF (congestive heart failure) ([place]), Chondrocalcinosis of right knee (05/06/8117), Chronic systolic heart failure ([place]), Depression, Diabetes uncomplicated adult-type II, Dilated cardiomyopathy ([place]), Dyslipidemia, Dysrhythmia, GERD (gastroesophageal reflux disease), Gout, unspecified, Hepatic steatosis (2011), History of kidney stones, echocardiogram, Hypertension, Obesity (BMI 30.0-34.9), Pacemaker, Paroxysmal atrial fibrillation ([place]), Peptic ulcer, Shortness of breath, and Sleep apnea.  Significant Hospital Events:  12/2 Afib with RVR. Cardioverted in ED. AICD did not fire. Milrinone for low co ox.  12/7 ICD fired, Torsades- likely from low K of 2.8.  12/8 Cath- multivessel disease w/ low CO. Volume overloaded.  12/11 Underwent Impella 5.5 insertion for persistent symptoms of forward failure with low cardiac indices. 12/12 Back to the OR for displacement of Impella. Extubated.  12/15 CABG x4, with LIMA-LAD, SVG-PDA/PLV, SVG-OM 12/16 Emergently opened chest at bedside for tamponade, clot compressing right atrium  12/16 To OR for Exploration of chest due to bleeding  12/19 Started CRRT 12/23 Sternal Closure in OR 12/24 Worsening shock.  Chest reopened at bedside with improvement in hemodynamics.  No mediastinal  hematoma. 12/25 A flutter > rapid a paced to sinus rhythm.  Remains on high-dose pressors, inotropes 12/28 Weaning vasopressor requirements.  For chest washout today.  Back in atrial fibrillation, refractory to increased amiodarone and attempted overdrive pacing.  DCCV to NSR. Tolerating fluid removal via CRRT. 1/03 Appears euvolemic.  Adjusting glycemic control.  Chest still open so not weaning 1/04 Spiking fever, Vanco meropenem resumed after culture sent from blood and sputum. 1/05 Awaiting return to the OR for wound VAC dressing assessment fever curve and white blood cell count improving after antibiotics resumed the day prior, did have chest closed, developed tamponade physiology shortly after had to go back for emergent reopening of chest, return to intensive care and refractory shock 1/06 Wound VAC dressing replaced.  Still in shock.  Starting to wean sedation 1/07 Still on high-dose pressors, CRRT 1/15 Recurrent Vtach, shocked 1/18 Attempted pressure change to P 3 with addition of Dobutamine . MAP goal > 60 1/19 Ongoing attempts at impella wean, on PS 1/20 Stable on vent, following commands with sedation weaned, continued Impella wean attempt 1/21 Impella removed, on FloTrac, WBC 22K, pancultured, Cefepime/Vanc started empirically for low SVR, concern for sepsis 1/22 antifungals added Impella removed on 1/21, 3 PM CT x3 in place 1/27 OR with plastics for debridement, ACell, partial closure 1/28 plan to stop CRRT when clots off 1/29 CRRT didn't clot off, continues on  Consults:  PCCM, nephrology, cardiology, cardiothoracic surgery, EP  Procedures:  R PICC 12/2 >> R axillary Impella 5.5 12/11 >> 1/21 ETT 12/15 >> 1/10 R femoral arterial sheath 12/15 >> 12/31 R femoral HD cath 12/19 >> 12/31 Chest tube - right pleural, left pleural , mediastinal 12/23 >> 1/5 R IJ HD cath 12/31 >> R Radial A-Line 1/6 >> Chest tube - L pleural, R pleural, mediastinal 1/10 >> Trach 1/10 PEG  1/10  Significant Diagnostic Tests:  12/8 LHC  Elevated R and L heart filling pressures with low CO, severe 3-vessel disease  Micro Data:  1/19 BCx2 -no growth at 3 days 1/21 OR Cx >>staph epi  Antimicrobials:  Levaquin 1/21 (periop ppx) Meropenem 1/21>> Vanc 1/21 >> Anidulafungin 1/22>>  Interval history:  NAEO CRRT didn't clot off yesterday, continues on CRRT  WBC to 15 from 13. Afebrile  Objective   Blood pressure (!) 94/56, pulse 63, temperature 97.7 F (36.5 C), temperature source Axillary, resp. rate (!) 24, height 6\' 3"  (1.905 m), weight 88.7 kg, SpO2 100 %. CVP:  [9 mmHg-18 mmHg] 9 mmHg  Vent Mode: PRVC FiO2 (%):  [40 %] 40 % Set Rate:  [24 bmp] 24 bmp Vt Set:  [510 mL] 510 mL PEEP:  [5 cmH20] 5 cmH20 Pressure Support:  [8 cmH20] 8 cmH20 Plateau Pressure:  [20 cmH20-23 cmH20] 22 cmH20   Intake/Output Summary (Last 24 hours) at 02/26/2020 0811 Last data filed at 02/26/2020 0700 Gross per 24 hour  Intake 3871.56 ml  Output 5339 ml  Net -1467.44 ml   Filed Weights   02/14/2020 0500 02/25/20 0500 02/26/20 0500  Weight: 89.5 kg 89 kg 88.7 kg   CVP:  [9 mmHg-18 mmHg] 9 mmHg   Examination: General: Chronically and critically ill appearing middle aged M reclined in bed trach/vent crrt NAD  HEENT: [place] pink tacky mm trach secure anicteric sclera glasses  Neuro: Lethargic, weakly follows commands. PERRL CV: rrr  Midline sternal wound vac, chest tube, JP, R shoulder wound staples with scabbing.  PULM: Variable Vt and rate on PSV/CPAP. Diminished lung bases, some scattered fine crackles  GI: Soft ndnt. PEG with some tan drainage around insertion site  Extremities: no obvious deformity, no cyanosis or clubbing skin: pale, c/d/w  Assessment & Plan:   Acute hypoxic respiratory failure requiring MV, s/p trach (1/10) Possible sepsis due to HCAP Persistent small R pleural effusion P -cont MV support, wean as able. Currently tolerating PSV/CPAP but historically has become  hypoxic/tachypnic after a few hours  -VAP, PAD -pulm hygiene -cont to monitor effusion, PRN CXR  -eraxis, mero, vanc for possible sepsis   Cardiogenic shock  -s/p impella, removed 1/21 -s/p CABG c/b tamponade and multiple chest re-openings, prolonged open chest  -s/p excision/debridement + ACell application, partial closure with plastics 1/27 Sternal wound infection: staph epidermis P -per CVTS / HF  -plastics plan for return OR next week for debridement+ matrix app, vac change and possible further closure. Date TBD -dobutamine, midodrine -hydrocort 25 mg q12 hr (decreased to this dose on 1/27) - 7 d mero, 7 d eraxis, 10 d vanc -trend CBC, fever curve   Afib RVR, recurrent VT, Torsades -- improved  P -ICU monitoring -lyte optimization -amio, mexiletine  Acute renal failure on CKD IIIa requiring RRT Cardiorenal syndrome P -nephro following -plan to stop CRRT for iHD when CRRt clots, has not clotted yet   Hyponatremia  -trend. Volume removal via CRRT  Anemia -trend H/H  DM with hyperglycemia  -SSI + basal   Profound deconditioning -- critical illness, infection, increased metabolic demand  Dysphagia -EN per RDN -PT/OT when appropriate  -fiber, lomotil   Daily Goals Checklist  Pain/Anxiety/Delirium protocol (if indicated): Precedex/Dilaudid gtt, weaning as able VAP protocol (if indicated): yes DVT prophylaxis:  Heparin gtt Nutrition Status: TF GI prophylaxis: Pantoprazole Glucose control: Insulin (Basal, Standing, SSI) Mobility/therapy needs: per primary Code Status: Full  Family Communication: per primary Disposition: ICU  CRITICAL CARE Performed by: Cristal Generous   Total critical care time: 33 minutes  Critical care time was exclusive of separately billable procedures and treating other patients. Critical care was necessary to treat or prevent imminent or life-threatening deterioration.  Critical care was time spent personally by me on the  following activities: development of treatment plan with patient and/or surrogate as well as nursing, discussions with consultants, evaluation of patient's response to treatment, examination of patient, obtaining history from patient or surrogate, ordering and performing treatments and interventions, ordering and review of laboratory studies, ordering and review of radiographic studies, pulse oximetry and re-evaluation of patient's condition.

## 2020-02-26 NOTE — Progress Notes (Signed)
[facility] NEPHROLOGY PROGRESS NOTE  Assessment/ Plan: Pt is a 50 y.o. yo male  with familial cardiomyopathy EF20% s/p redo CABG 29/47/65 complicated by persistent shock, multiple attempts at sternal closure with formation of chest hematoma and tamponade, Afib with RVR, and AKI requiring CRRT.    # Anuric AKI/CKD stage III- presumably due to ischemic ATN in setting of cardiogenic shock s/p CABG.  CRRT started 01/16/20.  All fluids 4K/2.5Ca.  RIJ on 01/28/20. Continue CRRT, no prescription change, UF is net negative as tolerated- cont this rate.  Hyperkalemia improved.  The line was changed to right femoral catheter.  He is on inotropic support and vasopressin currently.  I'm not sure he would tolerate HD at this point, particularly in light of his significant fluid removal needs due to obligatory intakes with meds, etc but plan is to hold CRRT next time there is a natural break (clots, filter change out, stopped for OR. etc), see if he can tolerate a 24-48h pause (which will be necessary for conversion to iHD) and then see if he can tolerate the hemodynamic stress of hemodialysis.   # CAD s/p CABG complicated by cardiogenic shock and post-op hematoma. secondary closure on 12/23 then had to be re opened. Went to the OR for washout on 12/28, bedside washout 02/01/19.  OR 02/22/2020 for closure, then had emergent reopening at the bedside 02/13/2020 d/t anterior chest hematoma again. S/p OR plastic surgery 1/27 - excision of wound, 2cm closure.   # Cardiogenic shock- underlying familial cardiomyopathy s/p redo CABG, EF ~20%. Remains on pressors, inotrope. Midodrine 20 tid.  The Impella was removed on 1/21.  # ABLA- s/p postoperative bleeding into chest cavity s/p re-exploration for tamponade/hematoma. Received multiple blood products  # Atrial fibrillation - s/p DCCV on 12/16. On amiodarone.   # VDRF- per CHF/PCCMsvc. Trach/peg 1/10  Subjective: Seen and examined in ICU.    Running  CRRT.  On vent.  Remains critically ill without any new event.  I/Os 4.0 / 5.4. Discussed with RN.    Objective Vital signs in last 24 hours: Vitals:   02/26/20 0830 02/26/20 0900 02/26/20 0930 02/26/20 1000  BP: 113/71 101/65 109/70   Pulse: 84 80 83 87  Resp: _0 Temp:      TempSrc:      SpO2: 100% 100% 100% 100%  Weight:      Height:       Weight change: -0.3 kg  Intake/Output Summary (Last 24 hours) at 02/26/2020 1139 Last data filed at 02/26/2020 1100 Gross per 24 hour  Intake 4094.1 ml  Output 5755 ml  Net -1660.9 ml    Labs: Basic Metabolic Panel: Recent Labs  Lab 02/25/20 0417 02/25/20 1600 02/26/20 0402  NA 133* 135 131*  K 3.5 3.8 3.8  CL 101 103 100  CO2 _1 GLUCOSE 138* 175* 156*  BUN 38* 40* 41*  CREATININE 0.95 0.97 0.99  CALCIUM 7.9* 7.9* 7.9*  PHOS 2.4* 2.5 2.3*   Liver Function Tests: Recent Labs  Lab 02/25/20 0417 02/25/20 1600 02/26/20 0402  ALBUMIN 1.9* 2.1* 1.9*   No results for input(s): LIPASE, AMYLASE in the last 168 hours. No results for input(s): AMMONIA in the last 168 hours. CBC: Recent Labs  Lab 02/22/20 0415 02/23/20 0347 02/23/20 0523 02/26/2020 0322 02/25/20 0417 02/26/20 0402  WBC 16.3* 17.4*  --  15.9* 13.4* 15.5*  NEUTROABS 14.4* 15.3*  --  13.2* 11.1* 12.9*  HGB 8.0*  8.6*   < > 9.1* 8.8* 8.9*  HCT 25.9* 28.4*   < > 31.5* 31.4* 31.0*  MCV 90.2 90.7  --  91.6 93.5 93.4  PLT 315 361  --  362 332 321   < > = values in this interval not displayed.   Cardiac Enzymes: No results for input(s): CKTOTAL, CKMB, CKMBINDEX, TROPONINI in the last 168 hours. CBG: Recent Labs  Lab 02/25/20 1542 02/25/20 1942 02/26/20 0044 02/26/20 0404 02/26/20 0820  GLUCAP 137* 171* 174* 144* 161*    Iron Studies: No results for input(s): IRON, TIBC, TRANSFERRIN, FERRITIN in the last 72 hours. Studies/Results: No results found.  Medications: Infusions: .  prismasol BGK 4/2.5 500 mL/hr at 02/26/20 0538  .   prismasol BGK 4/2.5 200 mL/hr at 02/26/20 0349  . sodium chloride Stopped (02/26/20 0042)  . sodium chloride    . dexmedetomidine (PRECEDEX) IV infusion 0.4 mcg/kg/hr (02/26/20 1100)  . dextrose Stopped (02/23/2020 1846)  . DOBUTamine 5 mcg/kg/min (02/26/20 1100)  . feeding supplement (PIVOT 1.5 CAL) 1,000 mL (02/26/20 0155)  . heparin 1,700 Units/hr (02/26/20 1100)  . HYDROmorphone 2 mg/hr (02/26/20 1100)  . lactated ringers 20 mL/hr at 02/26/20 1100  . prismasol BGK 4/2.5 1,500 mL/hr at 02/26/20 0844  . vancomycin Stopped (02/25/20 1621)    Scheduled Medications: . sodium chloride   Intravenous Once  . amiodarone  200 mg Per Tube BID  . vitamin C  500 mg Per Tube TID  . aspirin  324 mg Per Tube Daily  . atorvastatin  80 mg Per Tube Daily  . busPIRone  10 mg Per Tube TID  . chlorhexidine gluconate (MEDLINE KIT)  15 mL Mouth Rinse BID  . Chlorhexidine Gluconate Cloth  6 each Topical Daily  . darbepoetin (ARANESP) injection - NON-DIALYSIS  100 mcg Subcutaneous Q Sat-1800  . feeding supplement (PROSource TF)  45 mL Per Tube TID  . fiber  1 packet Per Tube BID  . FLUoxetine  40 mg Per Tube BID  . hydrocortisone sod succinate (SOLU-CORTEF) inj  25 mg Intravenous Q12H  . HYDROmorphone  4 mg Per Tube Q6H  . insulin aspart  0-9 Units Subcutaneous Q4H  . insulin aspart  4 Units Subcutaneous Q4H  . insulin detemir  12 Units Subcutaneous Q12H  . LORazepam  1 mg Intravenous Q4H  . mouth rinse  15 mL Mouth Rinse 10 times per day  . melatonin  5 mg Per Tube Daily  . mexiletine  150 mg Per Tube Q8H  . midodrine  20 mg Per Tube TID WC  . multivitamin  1 tablet Per Tube QHS  . nutrition supplement (JUVEN)  1 packet Per Tube BID BM  . pantoprazole (PROTONIX) IV  40 mg Intravenous Q12H  . sodium chloride flush  10-40 mL Intracatheter Q12H  . thiamine  100 mg Per Tube Daily  . valproic acid  250 mg Per Tube BID    have reviewed scheduled and prn medications.  Physical Exam: General:  Critically ill looking male sedated and on vent via trach.  Heart:RRR, s1s2 nl Lungs: Coarse breath sound bilateral Abdomen:soft, Non-tender, non-distended Extremities trace edema Dialysis Access:  right femoral catheter site clean.  Ria Comment A Jun Rightmyer 02/26/2020,11:39 AM  LOS: 58 days

## 2020-02-26 NOTE — Progress Notes (Signed)
2 Days Post-Op Procedure(s) (LRB): Sternal wound debridement (N/A) APPLICATION OF A-CELL (N/A) Wound vac change (N/A) Subjective:  trached on vent  Stable night.  Remains on dobut 5 with Co-ox 60.5, CVP 9.  Objective: Vital signs in last 24 hours: Temp:  [97.1 F (36.2 C)-97.7 F (36.5 C)] 97.7 F (36.5 C) (01/29 0400) Pulse Rate:  [62-91] 63 (01/29 0700) Cardiac Rhythm: Normal sinus rhythm (01/29 0400) Resp:  [8-27] 24 (01/29 0700) BP: (89-117)/(54-81) 94/56 (01/29 0700) SpO2:  [100 %] 100 % (01/29 0755) FiO2 (%):  [30 %-40 %] 30 % (01/29 0755) Weight:  [88.7 kg] 88.7 kg (01/29 0500)  Hemodynamic parameters for last 24 hours: CVP:  [9 mmHg-18 mmHg] 9 mmHg  Intake/Output from previous day: 01/28 0701 - 01/29 0700 In: 4035.4 [I.V.:1520.2; NG/GT:1690; IV Piggyback:545.2] Out: 5486 [Drains:310; Stool:500; Chest Tube:10] Intake/Output this shift: Total I/O In: -  Out: 383 [Other:383]  General appearance: sedated on vent Neurologic: more sleepy per nurse so Precedex decreased to 0.4 Heart: regular rate and rhythm, S1, S2 normal, no murmur Lungs: clear to auscultation bilaterally Abdomen: soft, non-tender; bowel sounds normal Extremities: no cyanosis or edema Wound: chest VAC dressing intact. Impella site ok. Staples ok  Lab Results: Recent Labs    02/25/20 0417 02/26/20 0402  WBC 13.4* 15.5*  HGB 8.8* 8.9*  HCT 31.4* 31.0*  PLT 332 321   BMET:  Recent Labs    02/25/20 1600 02/26/20 0402  NA 135 131*  K 3.8 3.8  CL 103 100  CO2 24 24  GLUCOSE 175* 156*  BUN 40* 41*  CREATININE 0.97 0.99  CALCIUM 7.9* 7.9*    PT/INR: No results for input(s): LABPROT, INR in the last 72 hours. ABG    Component Value Date/Time   PHART 7.400 02/23/2020 0523   HCO3 25.7 02/23/2020 0523   TCO2 27 02/23/2020 0523   ACIDBASEDEF 1.0 02/21/2020 0625   O2SAT 60.5 02/26/2020 0402   CBG (last 3)  Recent Labs    02/25/20 1942 02/26/20 0044 02/26/20 0404  GLUCAP 171*  174* 144*    Assessment/Plan: S/P Procedure(s) (LRB): Sternal wound debridement (N/A) APPLICATION OF A-CELL (N/A) Wound vac change (N/A)  He remains hemodynamically stable on dobut 5.  VDRF: CCM following.  CRRT: plan attempt at intermittent HD when filter clots.  Tolerating TF  Continue VAC dressing to chest per Plastic Surgery  DC all chest tubes and JP since no drainage. Keep staples in right axillary incision.   LOS: 58 days    Gaye Pollack 02/26/2020

## 2020-02-26 NOTE — Progress Notes (Addendum)
[place] for Heparin Indication: Afib  Allergies  Allergen Reactions  . Orange Fruit Anaphylaxis, Hives and Other (See Comments)    Per Pt- Blisters around lips and Hives all over, also  . Penicillins Anaphylaxis    Did it involve swelling of the face/tongue/throat, SOB, or low BP? Yes Did it involve sudden or severe rash/hives, skin peeling, or any reaction on the inside of your mouth or nose? Yes Did you need to seek medical attention at a hospital or doctor's office? No When did it last happen?childhood If all above answers are "NO", may proceed with cephalosporin use.  [name] [Empagliflozin] Itching  . Basaglar Kwikpen [Insulin Glargine] Nausea And Vomiting    Patient Measurements: Height: 6\' 3"  (190.5 cm) Weight: 88.7 kg (195 lb 8.8 oz) IBW/kg (Calculated) : 84.5 Heparin dosing wt: 101 kg  Vital Signs: Temp: 97.7 F (36.5 C) (01/29 0400) Temp Source: Axillary (01/29 0400) BP: 94/56 (01/29 0700) Pulse Rate: 63 (01/29 0700)  Labs: Recent Labs    02/07/2020 0322 02/07/2020 1537 02/25/20 0155 02/25/20 0417 02/25/20 1600 02/26/20 0402  HGB 9.1*  --   --  8.8*  --  8.9*  HCT 31.5*  --   --  31.4*  --  31.0*  PLT 362  --   --  332  --  321  HEPARINUNFRC 0.32  --  0.23*  --   --  0.23*  CREATININE 0.98   < >  --  0.95 0.97 0.99   < > = values in this interval not displayed.    Estimated Creatinine Clearance: 105.5 mL/min (by C-G formula based on SCr of 0.99 mg/dL).   Assessment: 32 yoM with hx PAF on apixaban PTA. Pt admitted with shock and required Impella 5.5 support. CABG 12/15 with chest left open, c/b hematoma and significant bleeding. Ultimately heparin started via purge for Impella. DCCV performed 12/16, partial chest closure 1/5, Impella removed 1/21. Heparin resumed for AFib 1/22.  Heparin level remains slightly below goal at 0.23 after increasing drip rate to 1600 units/hr. CBC stable, no bleeding or infusion  issues per discussion with nursing,  Goal of Therapy:  Heparin level ~0.3  Monitor platelets by anticoagulation protocol: Yes   Plan:  Increase heparin to 1700 units/h Check 6-hr HL Monitor daily HL, CBC, s/sx bleeding  ============================================ PM Update: 6-hr heparin level up but still slightly subtherapeutic at 0.27 after increasing drip rate to 1700 units/hr. No bleeding or infusion issues per discussion with nursing.  - Increase heparin to 1800 units/hr - Check next HL with AM labs - Daily HL, CBC, s/sx bleeding  Richardine Service, PharmD, BCPS PGY2 Cardiology Pharmacy Resident Phone: [phone] 02/26/2020  8:38 AM  Please check [email or web address] for unit-specific pharmacy phone numbers.

## 2020-02-26 NOTE — Progress Notes (Signed)
Patient ID: NEHEMIAS SAUCEDA, male   DOB: [birth year], 50 y.o.   MRN: [id number] [facility] Evening Rounds:  Hemodynamically stable CVP 3  CRRT still going  BMET    Component Value Date/Time   NA 133 (L) 02/26/2020 1446   NA 138 09/08/2018 0924   K 4.0 02/26/2020 1446   CL 102 02/26/2020 1446   CO2 24 02/26/2020 1446   GLUCOSE 138 (H) 02/26/2020 1446   BUN 42 (H) 02/26/2020 1446   BUN 23 09/08/2018 0924   CREATININE 1.00 02/26/2020 1446   CREATININE 1.13 05/13/2013 1533   CALCIUM 7.9 (L) 02/26/2020 1446   GFRNONAA >60 02/26/2020 1446   GFRNONAA 79 05/13/2013 1533   GFRAA >60 03/08/2019 1515   GFRAA >89 05/13/2013 1533   Stable on vent.

## 2020-02-27 DIAGNOSIS — J9620 Acute and chronic respiratory failure, unspecified whether with hypoxia or hypercapnia: Secondary | ICD-10-CM | POA: Diagnosis not present

## 2020-02-27 DIAGNOSIS — R570 Cardiogenic shock: Secondary | ICD-10-CM | POA: Diagnosis not present

## 2020-02-27 DIAGNOSIS — Z515 Encounter for palliative care: Secondary | ICD-10-CM | POA: Diagnosis not present

## 2020-02-27 DIAGNOSIS — J9601 Acute respiratory failure with hypoxia: Secondary | ICD-10-CM | POA: Diagnosis not present

## 2020-02-27 DIAGNOSIS — Z9911 Dependence on respirator [ventilator] status: Secondary | ICD-10-CM | POA: Diagnosis not present

## 2020-02-27 LAB — RENAL FUNCTION PANEL
Albumin: 1.9 g/dL — ABNORMAL LOW (ref 3.5–5.0)
Albumin: 2 g/dL — ABNORMAL LOW (ref 3.5–5.0)
Anion gap: 7 (ref 5–15)
Anion gap: 8 (ref 5–15)
BUN: 43 mg/dL — ABNORMAL HIGH (ref 6–20)
BUN: 46 mg/dL — ABNORMAL HIGH (ref 6–20)
CO2: 26 mmol/L (ref 22–32)
CO2: 26 mmol/L (ref 22–32)
Calcium: 8 mg/dL — ABNORMAL LOW (ref 8.9–10.3)
Calcium: 8.1 mg/dL — ABNORMAL LOW (ref 8.9–10.3)
Chloride: 101 mmol/L (ref 98–111)
Chloride: 102 mmol/L (ref 98–111)
Creatinine, Ser: 0.94 mg/dL (ref 0.61–1.24)
Creatinine, Ser: 0.95 mg/dL (ref 0.61–1.24)
GFR, Estimated: >60 mL/min (ref >60)
GFR, Estimated: >60 mL/min (ref >60)
Glucose, Bld: 156 mg/dL — ABNORMAL HIGH (ref 70–99)
Glucose, Bld: 154 mg/dL — ABNORMAL HIGH (ref 70–99)
Phosphorus: 1.9 mg/dL — ABNORMAL LOW (ref 2.5–4.6)
Phosphorus: 2.1 mg/dL — ABNORMAL LOW (ref 2.5–4.6)
Potassium: 4 mmol/L (ref 3.5–5.1)
Potassium: 4.5 mmol/L (ref 3.5–5.1)
Sodium: 134 mmol/L — ABNORMAL LOW (ref 135–145)
Sodium: 136 mmol/L (ref 135–145)

## 2020-02-27 LAB — CBC WITH DIFFERENTIAL/PLATELET
Abs Immature Granulocytes: 0.19 10*3/uL — ABNORMAL HIGH (ref 0.00–0.07)
Basophils Absolute: 0 10*3/uL (ref 0.0–0.1)
Basophils Relative: 0 %
Eosinophils Absolute: 0.2 10*3/uL (ref 0.0–0.5)
Eosinophils Relative: 1 %
HCT: 31.4 % — ABNORMAL LOW (ref 39.0–52.0)
Hemoglobin: 8.9 g/dL — ABNORMAL LOW (ref 13.0–17.0)
Immature Granulocytes: 1 %
Lymphocytes Relative: 6 %
Lymphs Abs: 1 10*3/uL (ref 0.7–4.0)
MCH: 26.4 pg (ref 26.0–34.0)
MCHC: 28.3 g/dL — ABNORMAL LOW (ref 30.0–36.0)
MCV: 93.2 fL (ref 80.0–100.0)
Monocytes Absolute: 1.3 10*3/uL — ABNORMAL HIGH (ref 0.1–1.0)
Monocytes Relative: 9 %
Neutro Abs: 12.6 10*3/uL — ABNORMAL HIGH (ref 1.7–7.7)
Neutrophils Relative %: 83 %
Platelets: 339 10*3/uL (ref 150–400)
RBC: 3.37 MIL/uL — ABNORMAL LOW (ref 4.22–5.81)
RDW: 19.9 % — ABNORMAL HIGH (ref 11.5–15.5)
WBC: 15.3 10*3/uL — ABNORMAL HIGH (ref 4.0–10.5)
nRBC: 0.1 % (ref 0.0–0.2)

## 2020-02-27 LAB — GLUCOSE, CAPILLARY
Glucose-Capillary: 141 mg/dL — ABNORMAL HIGH (ref 70–99)
Glucose-Capillary: 144 mg/dL — ABNORMAL HIGH (ref 70–99)
Glucose-Capillary: 149 mg/dL — ABNORMAL HIGH (ref 70–99)
Glucose-Capillary: 175 mg/dL — ABNORMAL HIGH (ref 70–99)
Glucose-Capillary: 175 mg/dL — ABNORMAL HIGH (ref 70–99)
Glucose-Capillary: 182 mg/dL — ABNORMAL HIGH (ref 70–99)
Glucose-Capillary: 201 mg/dL — ABNORMAL HIGH (ref 70–99)

## 2020-02-27 LAB — COOXEMETRY PANEL
Carboxyhemoglobin: 2.2 % — ABNORMAL HIGH (ref 0.5–1.5)
Methemoglobin: 0.7 % (ref 0.0–1.5)
O2 Saturation: 61.8 %
Total hemoglobin: 9.9 g/dL — ABNORMAL LOW (ref 12.0–16.0)

## 2020-02-27 LAB — MAGNESIUM: Magnesium: 2.5 mg/dL — ABNORMAL HIGH (ref 1.7–2.4)

## 2020-02-27 LAB — HEPARIN LEVEL (UNFRACTIONATED): Heparin Unfractionated: 0.33 IU/mL (ref 0.30–0.70)

## 2020-02-27 LAB — FUNGUS CULTURE, BLOOD: Culture: NO GROWTH

## 2020-02-27 MED ORDER — CHLORHEXIDINE GLUCONATE 0.12 % MT SOLN
OROMUCOSAL | Status: AC
  Administered 2020-02-27: 15 mL
  Filled 2020-02-27: qty 15

## 2020-02-27 NOTE — Progress Notes (Signed)
NAME:  Corey Palmer, MRN:  [id number], DOB:  04/24/1970, LOS: 72 ADMISSION DATE:  01/03/2020, CONSULTATION DATE: 01/25/2020 REFERRING MD: Aundra Dubin CHIEF COMPLAINT: Respiratory failure post Impella  HPI/Course in hospital  50 year old man admitted 12/1 with A. fib/RVR, torsades, cardiogenic shock [EF 15] requiring Impella placement (removed 1/21), CABG x 4 c/b cardiac tamponade requiring chest reopening (now with VAC), AKI requiring on CRRT.  Past Medical History:    has a past medical history of Anginal pain ([place]), Anxiety, Arthritis, Automatic implantable cardioverter-defibrillator in situ (2007), Bipolar disorder ([place]), CAD (coronary artery disease), Cancer ([place]) (2013), CHF (congestive heart failure) ([place]), Chondrocalcinosis of right knee (8/34/1962), Chronic systolic heart failure ([place]), Depression, Diabetes uncomplicated adult-type II, Dilated cardiomyopathy ([place]), Dyslipidemia, Dysrhythmia, GERD (gastroesophageal reflux disease), Gout, unspecified, Hepatic steatosis (2011), History of kidney stones, echocardiogram, Hypertension, Obesity (BMI 30.0-34.9), Pacemaker, Paroxysmal atrial fibrillation ([place]), Peptic ulcer, Shortness of breath, and Sleep apnea.  Significant Hospital Events:  12/2 Afib with RVR. Cardioverted in ED. AICD did not fire. Milrinone for low co ox.  12/7 ICD fired, Torsades- likely from low K of 2.8.  12/8 Cath- multivessel disease w/ low CO. Volume overloaded.  12/11 Underwent Impella 5.5 insertion for persistent symptoms of forward failure with low cardiac indices. 12/12 Back to the OR for displacement of Impella. Extubated.  12/15 CABG x4, with LIMA-LAD, SVG-PDA/PLV, SVG-OM 12/16 Emergently opened chest at bedside for tamponade, clot compressing right atrium  12/16 To OR for Exploration of chest due to bleeding  12/19 Started CRRT 12/23 Sternal Closure in OR 12/24 Worsening shock.  Chest reopened at bedside with improvement in hemodynamics.  No mediastinal  hematoma. 12/25 A flutter > rapid a paced to sinus rhythm.  Remains on high-dose pressors, inotropes 12/28 Weaning vasopressor requirements.  For chest washout today.  Back in atrial fibrillation, refractory to increased amiodarone and attempted overdrive pacing.  DCCV to NSR. Tolerating fluid removal via CRRT. 1/03 Appears euvolemic.  Adjusting glycemic control.  Chest still open so not weaning 1/04 Spiking fever, Vanco meropenem resumed after culture sent from blood and sputum. 1/05 Awaiting return to the OR for wound VAC dressing assessment fever curve and white blood cell count improving after antibiotics resumed the day prior, did have chest closed, developed tamponade physiology shortly after had to go back for emergent reopening of chest, return to intensive care and refractory shock 1/06 Wound VAC dressing replaced.  Still in shock.  Starting to wean sedation 1/07 Still on high-dose pressors, CRRT 1/15 Recurrent Vtach, shocked 1/18 Attempted pressure change to P 3 with addition of Dobutamine . MAP goal > 60 1/19 Ongoing attempts at impella wean, on PS 1/20 Stable on vent, following commands with sedation weaned, continued Impella wean attempt 1/21 Impella removed, on FloTrac, WBC 22K, pancultured, Cefepime/Vanc started empirically for low SVR, concern for sepsis 1/22 antifungals added Impella removed on 1/21, 3 PM CT x3 in place 1/27 OR with plastics for debridement, ACell, partial closure 1/28 plan to stop CRRT when clots off 1/29 CRRT didn't clot off, continues on  Consults:  PCCM, nephrology, cardiology, cardiothoracic surgery, EP  Procedures:  R PICC 12/2 >> R axillary Impella 5.5 12/11 >> 1/21 ETT 12/15 >> 1/10 R femoral arterial sheath 12/15 >> 12/31 R femoral HD cath 12/19 >> 12/31 Chest tube - right pleural, left pleural , mediastinal 12/23 >> 1/5 R IJ HD cath 12/31 >> R Radial A-Line 1/6 >> Chest tube - L pleural, R pleural, mediastinal 1/10 >> Trach 1/10 PEG  1/10  Significant Diagnostic Tests:  12/8 LHC  Elevated R and L heart filling pressures with low CO, severe 3-vessel disease  Micro Data:  1/19 BCx2 -no growth at 3 days 1/21 OR Cx >>staph epi  Antimicrobials:  Levaquin 1/21 (periop ppx) Meropenem 1/21>1/29 Vanc 1/21 >> Anidulafungin 1/22>1/29  Interval history:  Stable WBC at 15 MAEO Continues on CRRT Objective   Blood pressure 96/68, pulse 88, temperature (!) 97.2 F (36.2 C), temperature source Axillary, resp. rate 11, height 6\' 3"  (1.905 m), weight 87.5 kg, SpO2 100 %. CVP:  [2 mmHg-11 mmHg] 7 mmHg  Vent Mode: PRVC FiO2 (%):  [30 %] 30 % Set Rate:  [24 bmp] 24 bmp Vt Set:  [510 mL] 510 mL PEEP:  [5 cmH20] 5 cmH20 Plateau Pressure:  [17 cmH20-25 cmH20] 23 cmH20   Intake/Output Summary (Last 24 hours) at 02/27/2020 1052 Last data filed at 02/27/2020 1000 Gross per 24 hour  Intake 3506.71 ml  Output 4305 ml  Net -798.29 ml   Filed Weights   02/25/20 0500 02/26/20 0500 02/27/20 0500  Weight: 89 kg 88.7 kg 87.5 kg   CVP:  [2 mmHg-11 mmHg] 7 mmHg   Examination: General: Chronically and critically ill appearing M, trach/vent NAD  HEENT: NCAT. Trach secure. Mouth is dry  Neuro: Lethargic, awakens to voice and follows commands. Globally weak  CV: RRR midline sternal wound. Cap refill < 3 sec  PULM: Variable rate and volumes on PSV CPAP no accessory use. R sided scattered rhonchi  GI: Soft nontender + bowel sounds  Extremities: no obvious joint deformity, cyanosis or  Clubbing  skin: pale c/d/w   Assessment & Plan:   Acute hypoxic respiratory failure requiring MV, s/p trach 1/10 Possible HCAP Small R pleural effusion, persistent  P -AM CXR -- sounds a little rhonchorous 1/30  -cont MV support and wean as able. Has been able to tolerate a few hours of PSV CPAP then has tachypnea requiring PRVC  -VAP, PAD -pulm hygiene -finished mero and eraxis, on vanc   Cardiogenic shock -s/p impella, removed 1/21 -s/p CABG  c/b tamponade and multiple chest re-openings, prolonged open chest  -s/p excision/debridement + ACell application, partial closure with plastics 1/27 Sternal wound infection: staph epidermis P -per CVTS / HF  -return to OR with plastics next week  -dobutamine, midodrine, trend coox  -hydrocort 25 mg q12 hr (decreased to this dose on 1/27) eval for further decrease 1/31  - s/p 7 d mero, 7 d eraxis -10 d vanc -trend CBC, fever curve   Afib RVR, recurrent VT, Torsades -- improved  P -ICU monitoring -lyte optimization -amio, mexiletine  Acute renal failure in CKD IIIa requiring RRT Cardiorenal syndrome  P -nephro following -plan to continue CRRT through next OR trip (tentatively Wed of next week) so as to not get behind from a volume status -in future, plan to trial iHD   Hyponatremia, mild and improving -trend -volume removal per CRRT  Anemia -trend H/H  DM with hyperglycemia -SSI + basal   Deconditioning Dysphagia At risk malnutrition  -EN per RDN -PT/OT when appropriate  -fiber, lomotil   Daily Goals Checklist  Pain/Anxiety/Delirium protocol (if indicated): Precedex/Dilaudid gtt, weaning as able VAP protocol (if indicated): yes DVT prophylaxis:  Heparin gtt Nutrition Status: TF GI prophylaxis: Pantoprazole Glucose control: Insulin (Basal, Standing, SSI) Mobility/therapy needs: per primary Code Status: Full  Family Communication: per primary-- palliative also updating  Disposition: ICU   CRITICAL CARE Performed by: Cristal Generous  Total critical care time: 34 minutes  Critical care time was exclusive of separately billable procedures and treating other patients. Critical care was necessary to treat or prevent imminent or life-threatening deterioration.  Critical care was time spent personally by me on the following activities: development of treatment plan with patient and/or surrogate as well as nursing, discussions with consultants, evaluation of  patient's response to treatment, examination of patient, obtaining history from patient or surrogate, ordering and performing treatments and interventions, ordering and review of laboratory studies, ordering and review of radiographic studies, pulse oximetry and re-evaluation of patient's condition.  Eliseo Gum MSN, AGACNP-BC [place] [phone] If no answer, [phone] 02/27/2020, 10:53 AM

## 2020-02-27 NOTE — Progress Notes (Signed)
Patient ID: Corey Palmer, male   DOB: 06/16/1970, 50 y.o.   MRN: [id number]  This NP visited patient at the bedside as a follow up to last week's [place], for palliative medicine  needs and emotional support.    Medical records reviewed    Spoke to [name] by telephone for continued support and medical updates.     Education regarding his complex medical situation.  [name] tells me she is taking a few days of respite for herself.  Therapeutic listening and emotional support  She appreciates all support and is grateful for the care her husband is receiving       She understands the seriousness of her husband's medical situation, at this time she remains optimistic for improvement and eventual recovery.  Education offered to  wife the importance of continued conversation with her family and the  medical providers regarding overall plan of care and treatment options,  ensuring decisions are within the context of the patients values and GOCs.  Questions and concerns addressed   Total time spent on the unit was 15 minutes  PMT will continue to support holistically Greater than 50% of the time was spent in counseling and coordination of care  Wadie Lessen NP  Palliative Medicine Team Team Phone # [phone] Pager [phone]

## 2020-02-27 NOTE — Progress Notes (Signed)
[place] for Heparin Indication: Afib  Allergies  Allergen Reactions  . Orange Fruit Anaphylaxis, Hives and Other (See Comments)    Per Pt- Blisters around lips and Hives all over, also  . Penicillins Anaphylaxis    Did it involve swelling of the face/tongue/throat, SOB, or low BP? Yes Did it involve sudden or severe rash/hives, skin peeling, or any reaction on the inside of your mouth or nose? Yes Did you need to seek medical attention at a hospital or doctor's office? No When did it last happen?childhood If all above answers are "NO", may proceed with cephalosporin use.  [name] [Empagliflozin] Itching  . Basaglar Kwikpen [Insulin Glargine] Nausea And Vomiting    Patient Measurements: Height: 6\' 3"  (190.5 cm) Weight: 87.5 kg (192 lb 14.4 oz) IBW/kg (Calculated) : 84.5 Heparin dosing wt: 101 kg  Vital Signs: Temp: 97.2 F (36.2 C) (01/30 0730) Temp Source: Axillary (01/30 0730) BP: 114/75 (01/30 0800) Pulse Rate: 82 (01/30 0800)  Labs: Recent Labs    02/25/20 0417 02/25/20 1600 02/26/20 0402 02/26/20 1446 02/27/20 0436  HGB 8.8*  --  8.9*  --  8.9*  HCT 31.4*  --  31.0*  --  31.4*  PLT 332  --  321  --  339  HEPARINUNFRC  --   --  0.23* 0.27* 0.33  CREATININE 0.95   < > 0.99 1.00 0.94   < > = values in this interval not displayed.    Estimated Creatinine Clearance: 111.1 mL/min (by C-G formula based on SCr of 0.94 mg/dL).   Assessment: Corey Palmer with hx PAF on apixaban PTA. Pt admitted with shock and required Impella 5.5 support. CABG 12/15 with chest left open, c/b hematoma and significant bleeding. Ultimately heparin started via purge for Impella. DCCV performed 12/16, partial chest closure 1/5, Impella removed 1/21. Heparin resumed for AFib 1/22.  Heparin level at goal now at 0.33 after increasing drip rate to 1800 units/hr. CBC stable, no bleeding or infusion issues per discussion with nursing.  Goal of Therapy:   Heparin level ~0.3  Monitor platelets by anticoagulation protocol: Yes   Plan:  Continue heparin at 1800 units/h Monitor daily HL, CBC, s/sx bleeding  Richardine Service, PharmD, BCPS PGY2 Cardiology Pharmacy Resident Phone: [phone] 02/27/2020  8:35 AM  Please check [email or web address] for unit-specific pharmacy phone numbers.

## 2020-02-27 NOTE — Progress Notes (Signed)
[facility] NEPHROLOGY PROGRESS NOTE  Assessment/ Plan: Pt is a 50 y.o. yo male  with familial cardiomyopathy EF20% s/p redo CABG 93/81/82 complicated by persistent shock, multiple attempts at sternal closure with formation of chest hematoma and tamponade, Afib with RVR, and AKI requiring CRRT.    # Anuric AKI/CKD stage III- presumably due to ischemic ATN in setting of cardiogenic shock s/p CABG.  CRRT started 01/16/20.  All fluids 4K/2.5Ca.  RIJ on 01/28/20. Continue CRRT, no prescription change, UF is net negative as tolerated- cont this rate.  Hyperkalemia improved.  The line was changed to right femoral catheter.  He is on inotropic support and vasopressin currently.  At some point need to see if he will tolerate iHD but in light of going to OR again Wed for more chest closure we will continue CRRT for now so we don't get too far behind with I/Os at this point -- his intake is typically ~4L/day so would need to tolerate fairly robust UF with HD which I'm not sure he will at this point.   Another issue to clarify for disposition is if LTACH can do dobutamine + dialysis.   # CAD s/p CABG complicated by cardiogenic shock and post-op hematoma. secondary closure on 12/23 then had to be re opened. Went to the OR for washout on 12/28, bedside washout 02/01/19.  OR 02/23/2020 for closure, then had emergent reopening at the bedside 01/31/2020 d/t anterior chest hematoma again. S/p OR plastic surgery 1/27 - excision of wound, 2cm closure.   # Cardiogenic shock- underlying familial cardiomyopathy s/p redo CABG, EF ~20%. Remains on pressors, inotrope. Midodrine 20 tid.  The Impella was removed on 1/21.  # ABLA- s/p postoperative bleeding into chest cavity s/p re-exploration for tamponade/hematoma. Received multiple blood products  # Atrial fibrillation - s/p DCCV on 12/16. On amiodarone.   # VDRF- per CHF/PCCMsvc. Trach/peg 1/10  Subjective: Seen and examined in ICU.    Running CRRT.  On  vent.  Remains critically ill without any new event.  I/Os 3.6 / 4.8. Discussed with RN.  Discussed with Dr. Cyndia Bent.  Objective Vital signs in last 24 hours: Vitals:   02/27/20 0700 02/27/20 0730 02/27/20 0800 02/27/20 0801  BP: (!) 88/57 103/71 114/75   Pulse: 65 70 82   Resp: (!) 25 (!) 28 16   Temp:  (!) 97.2 F (36.2 C)    TempSrc:  Axillary    SpO2: 100% 100% 100% 100%  Weight:      Height:       Weight change: -1.2 kg  Intake/Output Summary (Last 24 hours) at 02/27/2020 0933 Last data filed at 02/27/2020 0800 Gross per 24 hour  Intake 3248.58 ml  Output 4506 ml  Net -1257.42 ml    Labs: Basic Metabolic Panel: Recent Labs  Lab 02/26/20 0402 02/26/20 1446 02/27/20 0436  NA 131* 133* 134*  K 3.8 4.0 4.0  CL 100 102 101  CO2 '24 24 26  ' GLUCOSE 156* 138* 156*  BUN 41* 42* 43*  CREATININE 0.99 1.00 0.94  CALCIUM 7.9* 7.9* 8.0*  PHOS 2.3* 2.3* 1.9*   Liver Function Tests: Recent Labs  Lab 02/26/20 0402 02/26/20 1446 02/27/20 0436  ALBUMIN 1.9* 2.0* 1.9*   No results for input(s): LIPASE, AMYLASE in the last 168 hours. No results for input(s): AMMONIA in the last 168 hours. CBC: Recent Labs  Lab 02/23/20 0347 02/23/20 0523 02/25/2020 0322 02/25/20 0417 02/26/20 0402 02/27/20 0436  WBC 17.4*  --  15.9* 13.4* 15.5* 15.3*  NEUTROABS 15.3*  --  13.2* 11.1* 12.9* 12.6*  HGB 8.6*   < > 9.1* 8.8* 8.9* 8.9*  HCT 28.4*   < > 31.5* 31.4* 31.0* 31.4*  MCV 90.7  --  91.6 93.5 93.4 93.2  PLT 361  --  362 332 321 339   < > = values in this interval not displayed.   Cardiac Enzymes: No results for input(s): CKTOTAL, CKMB, CKMBINDEX, TROPONINI in the last 168 hours. CBG: Recent Labs  Lab 02/26/20 1956 02/26/20 2008 02/27/20 0002 02/27/20 0434 02/27/20 0440  GLUCAP 184* 194* 182* 141* 149*    Iron Studies: No results for input(s): IRON, TIBC, TRANSFERRIN, FERRITIN in the last 72 hours. Studies/Results: No results found.  Medications: Infusions: .   prismasol BGK 4/2.5 500 mL/hr at 02/27/20 0555  .  prismasol BGK 4/2.5 200 mL/hr at 02/26/20 0349  . sodium chloride Stopped (02/26/20 1750)  . sodium chloride    . dexmedetomidine (PRECEDEX) IV infusion Stopped (02/27/20 0727)  . dextrose Stopped (02/08/2020 1846)  . DOBUTamine 5 mcg/kg/min (02/27/20 0800)  . feeding supplement (PIVOT 1.5 CAL) 1,000 mL (02/26/20 1618)  . heparin 1,800 Units/hr (02/27/20 0800)  . HYDROmorphone 2 mg/hr (02/27/20 0800)  . lactated ringers 20 mL/hr at 02/27/20 0800  . prismasol BGK 4/2.5 1,500 mL/hr at 02/27/20 0415  . vancomycin Stopped (02/26/20 1743)    Scheduled Medications: . sodium chloride   Intravenous Once  . amiodarone  200 mg Per Tube BID  . vitamin C  500 mg Per Tube TID  . aspirin  324 mg Per Tube Daily  . atorvastatin  80 mg Per Tube Daily  . busPIRone  10 mg Per Tube TID  . chlorhexidine gluconate (MEDLINE KIT)  15 mL Mouth Rinse BID  . Chlorhexidine Gluconate Cloth  6 each Topical Daily  . darbepoetin (ARANESP) injection - NON-DIALYSIS  100 mcg Subcutaneous Q Sat-1800  . feeding supplement (PROSource TF)  45 mL Per Tube TID  . fiber  1 packet Per Tube BID  . FLUoxetine  40 mg Per Tube BID  . hydrocortisone sod succinate (SOLU-CORTEF) inj  25 mg Intravenous Q12H  . HYDROmorphone  4 mg Per Tube Q6H  . insulin aspart  0-9 Units Subcutaneous Q4H  . insulin aspart  4 Units Subcutaneous Q4H  . insulin detemir  12 Units Subcutaneous Q12H  . LORazepam  1 mg Intravenous Q4H  . mouth rinse  15 mL Mouth Rinse 10 times per day  . melatonin  5 mg Per Tube Daily  . mexiletine  150 mg Per Tube Q8H  . midodrine  20 mg Per Tube TID WC  . multivitamin  1 tablet Per Tube QHS  . nutrition supplement (JUVEN)  1 packet Per Tube BID BM  . pantoprazole (PROTONIX) IV  40 mg Intravenous Q12H  . sodium chloride flush  10-40 mL Intracatheter Q12H  . thiamine  100 mg Per Tube Daily  . valproic acid  250 mg Per Tube BID    have reviewed scheduled and prn  medications.  Physical Exam: General: Critically ill looking male sedated and on vent via trach.  Heart:RRR, s1s2 nl Lungs: Coarse breath sound bilateral Abdomen:soft, Non-tender, non-distended Extremities trace edema Dialysis Access:  right femoral catheter site clean.  Ria Comment A Lucille Crichlow 02/27/2020,9:33 AM  LOS: 59 days

## 2020-02-27 NOTE — Progress Notes (Signed)
3 Days Post-Op Procedure(s) (LRB): Sternal wound debridement (N/A) APPLICATION OF A-CELL (N/A) Wound vac change (N/A) Subjective:  Tached on vent. Pressure support trials. Has tolerated 2 hrs.  Remains on dobut 5, with Co-ox 62 and CVP 7  Objective: Vital signs in last 24 hours: Temp:  [97.1 F (36.2 C)-98.4 F (36.9 C)] 97.2 F (36.2 C) (01/30 0730) Pulse Rate:  [65-100] 87 (01/30 0930) Cardiac Rhythm: Normal sinus rhythm;Heart block (01/30 0800) Resp:  [11-28] 11 (01/30 0930) BP: (76-117)/(49-75) 103/72 (01/30 0930) SpO2:  [94 %-100 %] 100 % (01/30 0930) FiO2 (%):  [30 %] 30 % (01/30 0801) Weight:  [87.5 kg] 87.5 kg (01/30 0500)  Hemodynamic parameters for last 24 hours: CVP:  [2 mmHg-11 mmHg] 7 mmHg  Intake/Output from previous day: 01/29 0701 - 01/30 0700 In: 3601.9 [I.V.:1493.8; NG/GT:1680; IV Piggyback:200.1] Out: 4788 [Drains:500; Stool:525] Intake/Output this shift: Total I/O In: 127 [I.V.:57; NG/GT:70] Out: 101 [Other:101]  General appearance: alert and cooperative Neurologic: intact Heart: regular rate and rhythm, S1, S2 normal, no murmur Lungs: coarse bilat Abdomen: soft, non-tender; bowel sounds normal Extremities: extremities normal, atraumatic, no cyanosis or edema Wound: VAC dressing in place  Lab Results: Recent Labs    02/26/20 0402 02/27/20 0436  WBC 15.5* 15.3*  HGB 8.9* 8.9*  HCT 31.0* 31.4*  PLT 321 339   BMET:  Recent Labs    02/26/20 1446 02/27/20 0436  NA 133* 134*  K 4.0 4.0  CL 102 101  CO2 24 26  GLUCOSE 138* 156*  BUN 42* 43*  CREATININE 1.00 0.94  CALCIUM 7.9* 8.0*    PT/INR: No results for input(s): LABPROT, INR in the last 72 hours. ABG    Component Value Date/Time   PHART 7.400 02/23/2020 0523   HCO3 25.7 02/23/2020 0523   TCO2 27 02/23/2020 0523   ACIDBASEDEF 1.0 02/21/2020 0625   O2SAT 61.8 02/27/2020 0436   CBG (last 3)  Recent Labs    02/27/20 0002 02/27/20 0434 02/27/20 0440  GLUCAP 182* 141* 149*     Assessment/Plan:  He remains hemodynamically stable on dobut 5.  VDRF: CCM following. Pressure support trials.  CRRT: discussed with nephrology and planning to continue CRRT for now.  Tolerating TF  Continue VAC dressing to chest per Plastic Surgery, wound reevaluation this week.   LOS: 59 days    Gaye Pollack 02/27/2020

## 2020-02-27 NOTE — Progress Notes (Addendum)
Patient ID: BELEN ZWAHLEN, male   DOB: 04/17/1970, 50 y.o.   MRN: [id number]     Advanced Heart Failure Rounding Note  PCP-Cardiologist: No primary care provider on file.   Subjective:    - 12/7 Torsades -ICD shock x1.  - 12/11 Impella 5.5 placed.  - 12/12 Impella repositioned in OR - 12/15 CABG x 4 with LIMA-LAD, SVG-PDA/PLV, SVG-OM - 12/16 DCCV afib.  Back to OR to re-open chest and again to evacuate hematoma.  - 12/19 CVVH initiated  - 12/23 Chest closed - 12/24 Chest reopened - 12/26 RAP back into NSR - 12/27 back in atrial fibrillation, unable to rapid atrial pace out.   - 12/28 To OR, chest partially closed and wound vac placed.  DCCV to NSR.  - 12/31 Antibiotics stopped. Swan removed, HD catheter moved from right femoral to right IJ.  - 1/5 To OR for chest closure. Chest reopened emergently at bedside again to evacuate hematoma  - 1/10 To OR: omental flap closure of chest with wound vac, tracheostomy, G-tube, J-tube.    - 1/12 Plastics and EP consulted.  - 1/13 Palliative Care consulted.  - 1/15 Went into VT, Shock 200 J and amiodarone bolus 150.   - 1/16: Milrinone stopped.    - 1/17: Partial closure of chest in OR - 1/18 Switched to dobutamine 4 mcg - 1/21 Impella Extracted.  Developed hyperkalemia when CVVH held with wide QRS rhythm and required transient external pacing.   - 1/27 OR for wound vac change, partial chest wall closure.   Co-ox stable at 62% on dobutamine 5, off pressors. Remains on CVVHD.   On vent through trach. He remains on vancomycin (Completed meropenem/anidulafungin tx on 1/28) with no growth to date on cultures. He moves to verbal stimuli.     Objective:   Weight Range: 87.5 kg Body mass index is 24.11 kg/m.   Vital Signs:   Temp:  [96.9 F (36.1 C)-98.4 F (36.9 C)] 98.4 F (36.9 C) (01/30 0400) Pulse Rate:  [62-100] 67 (01/30 0600) Resp:  [11-27] 27 (01/30 0600) BP: (76-113)/(49-72) 94/57 (01/30 0600) SpO2:  [94 %-100 %] 100 % (01/30  0600) FiO2 (%):  [30 %] 30 % (01/30 0400) Weight:  [87.5 kg] 87.5 kg (01/30 0500) Last BM Date: 02/26/20  Weight change: Filed Weights   02/25/20 0500 02/26/20 0500 02/27/20 0500  Weight: 89 kg 88.7 kg 87.5 kg    Intake/Output:   Intake/Output Summary (Last 24 hours) at 02/27/2020 0651 Last data filed at 02/27/2020 0600 Gross per 24 hour  Intake 3599.97 ml  Output 4887 ml  Net -1287.03 ml      Physical Exam   CVP 1 General:  Ill appearing HEENT: Trach, dry MM.  Neck: supple. No JVD.  Cor: Sternal wound vac intact. Regular rate & rhythm.  Lungs: Coarse b/l breath sounds.  Abdomen: soft, nontender, mildly distended. Good bowel sounds. Extremities: no cyanosis, clubbing, rash.  Dependent edema posterior thighs.  Neuro: alert, sedated.    Telemetry   SR rates 60-80s.  Personally reviewed.   Labs    CBC Recent Labs    02/26/20 0402 02/27/20 0436  WBC 15.5* 15.3*  NEUTROABS 12.9* 12.6*  HGB 8.9* 8.9*  HCT 31.0* 31.4*  MCV 93.4 93.2  PLT 321 505   Basic Metabolic Panel Recent Labs    02/26/20 0402 02/26/20 1446 02/27/20 0436  NA 131* 133* 134*  K 3.8 4.0 4.0  CL 100 102 101  CO2 24 24  26  GLUCOSE 156* 138* 156*  BUN 41* 42* 43*  CREATININE 0.99 1.00 0.94  CALCIUM 7.9* 7.9* 8.0*  MG 2.3  --  2.5*  PHOS 2.3* 2.3* 1.9*   Liver Function Tests Recent Labs    02/26/20 1446 02/27/20 0436  ALBUMIN 2.0* 1.9*   No results for input(s): LIPASE, AMYLASE in the last 72 hours. Cardiac Enzymes No results for input(s): CKTOTAL, CKMB, CKMBINDEX, TROPONINI in the last 72 hours.  BNP: BNP (last 3 results) Recent Labs    01/14/2020 1619  BNP 577.5*    ProBNP (last 3 results) No results for input(s): PROBNP in the last 8760 hours.   D-Dimer No results for input(s): DDIMER in the last 72 hours. Hemoglobin A1C No results for input(s): HGBA1C in the last 72 hours. Fasting Lipid Panel No results for input(s): CHOL, HDL, LDLCALC, TRIG, CHOLHDL, LDLDIRECT  in the last 72 hours. Thyroid Function Tests No results for input(s): TSH, T4TOTAL, T3FREE, THYROIDAB in the last 72 hours.  Invalid input(s): FREET3  Other results:   Imaging    No results found.   Medications:     Scheduled Medications: . sodium chloride   Intravenous Once  . amiodarone  200 mg Per Tube BID  . vitamin C  500 mg Per Tube TID  . aspirin  324 mg Per Tube Daily  . atorvastatin  80 mg Per Tube Daily  . busPIRone  10 mg Per Tube TID  . chlorhexidine gluconate (MEDLINE KIT)  15 mL Mouth Rinse BID  . Chlorhexidine Gluconate Cloth  6 each Topical Daily  . darbepoetin (ARANESP) injection - NON-DIALYSIS  100 mcg Subcutaneous Q Sat-1800  . feeding supplement (PROSource TF)  45 mL Per Tube TID  . fiber  1 packet Per Tube BID  . FLUoxetine  40 mg Per Tube BID  . hydrocortisone sod succinate (SOLU-CORTEF) inj  25 mg Intravenous Q12H  . HYDROmorphone  4 mg Per Tube Q6H  . insulin aspart  0-9 Units Subcutaneous Q4H  . insulin aspart  4 Units Subcutaneous Q4H  . insulin detemir  12 Units Subcutaneous Q12H  . LORazepam  1 mg Intravenous Q4H  . mouth rinse  15 mL Mouth Rinse 10 times per day  . melatonin  5 mg Per Tube Daily  . mexiletine  150 mg Per Tube Q8H  . midodrine  20 mg Per Tube TID WC  . multivitamin  1 tablet Per Tube QHS  . nutrition supplement (JUVEN)  1 packet Per Tube BID BM  . pantoprazole (PROTONIX) IV  40 mg Intravenous Q12H  . sodium chloride flush  10-40 mL Intracatheter Q12H  . thiamine  100 mg Per Tube Daily  . valproic acid  250 mg Per Tube BID    Infusions: .  prismasol BGK 4/2.5 500 mL/hr at 02/27/20 0555  .  prismasol BGK 4/2.5 200 mL/hr at 02/26/20 0349  . sodium chloride Stopped (02/26/20 1750)  . sodium chloride    . dexmedetomidine (PRECEDEX) IV infusion 0.7 mcg/kg/hr (02/27/20 0625)  . dextrose Stopped (02/16/2020 1846)  . DOBUTamine 5 mcg/kg/min (02/27/20 0600)  . feeding supplement (PIVOT 1.5 CAL) 1,000 mL (02/26/20 1618)  .  heparin 1,800 Units/hr (02/27/20 0600)  . HYDROmorphone 2 mg/hr (02/27/20 0600)  . lactated ringers 20 mL/hr at 02/27/20 0600  . prismasol BGK 4/2.5 1,500 mL/hr at 02/27/20 0415  . vancomycin Stopped (02/26/20 1743)    PRN Medications: sodium chloride, Place/Maintain arterial line **AND** sodium chloride, artificial tears, bisacodyl **  OR** bisacodyl, dextrose, docusate, heparin, heparin, HYDROmorphone, lip balm, loperamide HCl, metoprolol tartrate, midazolam, nystatin, polyethylene glycol, sodium chloride flush     Assessment/Plan   1. Cardiogenic shock/acute on chronic systolic CHF: Initially nonischemic cardiomyopathy.  Possible familial cardiomyopathy as both parents had cardiomyopathy and died at around 37. However, [employer] gene testing did not show any common mutation for cardiomyopathy.  However, this admission noted to have severe 3 vessel disease so suspect component of ischemic cardiomyopathy.  St Jude ICD.  Echo in 8/20 with EF 15% and mildly decreased RV function. Echo this admission with EF < 20%, moderate LV dilation, RV mildly reduced, severe LAE, no significant MR. Low output HF with markedly low EF.  He has a long history of cardiomyopathy (20 yrs), tends to minimize symptoms.  Cardiorenal syndrome with creatinine up to 2.2,  stabilized on milrinone and Impella 5.5. SCr 1.44 day of CABG. CABG 12/15.  Post-op shock, back to OR 12/16 to open chest (chest wall compartment syndrome) and again to evacuate hematoma.  TEE post-op with severe RV dysfunction.  CVVHD initiated 12/19 for volume removal in setting of rising Scr/decreased UOP. Suspect ATN. S/p chest closure 12/23. Hemodynamics much worse on 12/24 with rising pressor demands. Co-ox 36%.  Bedside echo severe biventricular dysfunction with marked septal bounce. Chest re-opened 12/24.  Was atrial paced back to NSR on 12/26 with improvement in hemodynamics but back in atrial fibrillation on 12/27 and unable to pace out, DCCV to NSR on  12/28. Went back to OR 1/5 for chest closure but several hours later required emergent re-opening of chest at bedside to evacuate hematoma. To OR again 1/10 with closure of chest with omental flap with G & J tube.  1/17 partial closure. 1/21 Impella removed.  Off CVVH post-removal, he developed hyperkalemia and wide QRS rhythm requiring transient external pacing, also with suspected septic shock (low SVR, fever, rising WBCs).  Now off pressors and on dobutamine gtt 5, co-ox 62%.  CVP 1 ongoing CVVH.  - Continue dobutamine 5, think he will need this chronically. Unable to wean.  - Continues on midodrine 20 TID.  -  Nephro unsure if he would be able to tolerate iHD. Once CRRT machine clots off or stops, hold and monitor him off CRRT and will try iHD - Not candidate to replace Impella (at this point, not candidate for LVAD or transplant). He could stay on dobutamine long-term.  - Dr. Aundra Dubin has discussed with his wife, will need to show that he can tolerate iHD to be candidate for [facility]  If he cannot, will need to start thinking about comfort care.  2. Atrial fibrillation: H/o PAF.  He was on dofetilide in the remote past but this was stopped due to noncompliance.  He had an upper GI bleed from antral ulcers in 3/12. He was seen by GI and was deemed safe to restart anticoagulation as long as he remains on a PPI.  No apparent recurrence of AF until just prior to this admission, was cardioverted back to NSR in ER but went back into atrial fibrillation.  Post-op afib, DCCV to NSR/BiV pacing am 12/16 -> back to AF. Rapid atrial pacing back to SR. Remains in NSR - Continue amiodarone per tube 200 mg bid.  - Continue heparin gtt.   3. CAD:   Cath this admission with severe 3 vessel disease. CABG x 4 on 12/15.  - Continue atorvastatin, ASA.  - No s/s angina 4. Acute on chronic hypoxemic respiratory failure:  Vent per CCM.  Has tracheostomy.  - CCM following, will need to work on slowly weaning vent.  5. AKI on  CKD stage 3: Likely ATN/cardiorenal. Anuric. On CVVH.    - Nephro recommendations appreciated. Remains on CVVHD. When filter clots try iHD  6. Diabetes: Insulin.  7. Hyponatremia: Resolved with CVVH.  8. Torsades/ICD shock: 12/7/212 in hospital event, in setting of severe hypokalemia and hypomagnesemia.   9. Gout: h/o gout. Complained of rt knee pain c/w previous flares - treated w/ prednisone burst (completed).  10: ID: Septic shock 1/21 with fever, low SVR, and elevated WBCs. Cultures no growth with exception of axillary cx on 1/21 grew staph epidermidis.  IJ catheter removed.  - On vancomycin.  Completed anidulafungin & Meropenem on 1/28.  - c/w hydrocortisone to 25 q 12 hrs.  11. FEN: c/w TFs.  12. Anemia: Hgb stable at 8.9  today.  Transfuse < 7.5.   13. Stage II Pressure Ulcer- sacrum: Continue to reposition every 2 hours R/L  14. Pleural Effusion: Loculated rt apical pleural effusion noted on imaging. Continue UF through CVVH.  PCCM following  15. VT: Amiodarone + mexiletine.   16. Deconditioning: Profound. Work with PT, nursing to work on range of motion.   Carlene Coria 02/27/2020 6:51 AM   Agree with above.   Remains on vent and CVVHD. CVP 3-5 so now keeping even. Sedated but will arouse. On dobutamine 5. Co-ox 62% Rhythm stable.   General:  Sedated on vent but will arouse HEENT: normal Neck: supple. no JVD. + trach Carotids 2+ bilat; no bruits. No lymphadenopathy or thryomegaly appreciated. Cor: PMI nondisplaced. + wound vac in place Regular rate & rhythm. No rubs, gallops or murmurs. Lungs: clear Abdomen: soft, nontender, nondistended. No hepatosplenomegaly. No bruits or masses. Good bowel sounds. Extremities: no cyanosis, clubbing, rash, tr edema RFV trialysis Neuro:as above  Remains on CVVHD and dobutamine Co-ox and volume status stable overnight. CVVHD circuit will expire in about 12 hours and then will attempt switch to iHD. If can tolerate iHD would be LTACH  candidate. If nt, would need switch to comfort care.   CRITICAL CARE Performed by: Glori Bickers  Total critical care time: 35 minutes  Critical care time was exclusive of separately billable procedures and treating other patients.  Critical care was necessary to treat or prevent imminent or life-threatening deterioration.  Critical care was time spent personally by me (independent of midlevel providers or residents) on the following activities: development of treatment plan with patient and/or surrogate as well as nursing, discussions with consultants, evaluation of patient's response to treatment, examination of patient, obtaining history from patient or surrogate, ordering and performing treatments and interventions, ordering and review of laboratory studies, ordering and review of radiographic studies, pulse oximetry and re-evaluation of patient's condition.  Glori Bickers, MD  9:42 AM

## 2020-02-27 NOTE — Progress Notes (Signed)
Patient ID: Corey Palmer, male   DOB: 1970/02/05, 50 y.o.   MRN: [id number] [facility] Evening Rounds:  Hemodynamically stable today  Tolerated PS for 1.5 hrs.  CRRT continues.  No new problems tonight.

## 2020-02-28 ENCOUNTER — Encounter (HOSPITAL_COMMUNITY): Payer: Self-pay | Admitting: Plastic Surgery

## 2020-02-28 ENCOUNTER — Inpatient Hospital Stay (HOSPITAL_COMMUNITY): Payer: BC Managed Care – PPO

## 2020-02-28 DIAGNOSIS — R570 Cardiogenic shock: Secondary | ICD-10-CM | POA: Diagnosis not present

## 2020-02-28 DIAGNOSIS — J9601 Acute respiratory failure with hypoxia: Secondary | ICD-10-CM | POA: Diagnosis not present

## 2020-02-28 LAB — RENAL FUNCTION PANEL
Albumin: 1.9 g/dL — ABNORMAL LOW (ref 3.5–5.0)
Albumin: 2 g/dL — ABNORMAL LOW (ref 3.5–5.0)
Anion gap: 7 (ref 5–15)
Anion gap: 6 (ref 5–15)
BUN: 45 mg/dL — ABNORMAL HIGH (ref 6–20)
BUN: 43 mg/dL — ABNORMAL HIGH (ref 6–20)
CO2: 26 mmol/L (ref 22–32)
CO2: 26 mmol/L (ref 22–32)
Calcium: 7.9 mg/dL — ABNORMAL LOW (ref 8.9–10.3)
Calcium: 8 mg/dL — ABNORMAL LOW (ref 8.9–10.3)
Chloride: 102 mmol/L (ref 98–111)
Chloride: 102 mmol/L (ref 98–111)
Creatinine, Ser: 0.9 mg/dL (ref 0.61–1.24)
Creatinine, Ser: 0.84 mg/dL (ref 0.61–1.24)
GFR, Estimated: >60 mL/min (ref >60)
GFR, Estimated: >60 mL/min (ref >60)
Glucose, Bld: 133 mg/dL — ABNORMAL HIGH (ref 70–99)
Glucose, Bld: 113 mg/dL — ABNORMAL HIGH (ref 70–99)
Phosphorus: 2 mg/dL — ABNORMAL LOW (ref 2.5–4.6)
Phosphorus: 1.8 mg/dL — ABNORMAL LOW (ref 2.5–4.6)
Potassium: 4.3 mmol/L (ref 3.5–5.1)
Potassium: 4.2 mmol/L (ref 3.5–5.1)
Sodium: 135 mmol/L (ref 135–145)
Sodium: 134 mmol/L — ABNORMAL LOW (ref 135–145)

## 2020-02-28 LAB — GLUCOSE, CAPILLARY
Glucose-Capillary: 111 mg/dL — ABNORMAL HIGH (ref 70–99)
Glucose-Capillary: 119 mg/dL — ABNORMAL HIGH (ref 70–99)
Glucose-Capillary: 126 mg/dL — ABNORMAL HIGH (ref 70–99)
Glucose-Capillary: 132 mg/dL — ABNORMAL HIGH (ref 70–99)
Glucose-Capillary: 146 mg/dL — ABNORMAL HIGH (ref 70–99)
Glucose-Capillary: 147 mg/dL — ABNORMAL HIGH (ref 70–99)
Glucose-Capillary: 163 mg/dL — ABNORMAL HIGH (ref 70–99)
Glucose-Capillary: 164 mg/dL — ABNORMAL HIGH (ref 70–99)

## 2020-02-28 LAB — CBC WITH DIFFERENTIAL/PLATELET
Abs Immature Granulocytes: 0.13 10*3/uL — ABNORMAL HIGH (ref 0.00–0.07)
Basophils Absolute: 0 10*3/uL (ref 0.0–0.1)
Basophils Relative: 0 %
Eosinophils Absolute: 0.1 10*3/uL (ref 0.0–0.5)
Eosinophils Relative: 1 %
HCT: 32 % — ABNORMAL LOW (ref 39.0–52.0)
Hemoglobin: 9.1 g/dL — ABNORMAL LOW (ref 13.0–17.0)
Immature Granulocytes: 1 %
Lymphocytes Relative: 9 %
Lymphs Abs: 1.2 10*3/uL (ref 0.7–4.0)
MCH: 26.6 pg (ref 26.0–34.0)
MCHC: 28.4 g/dL — ABNORMAL LOW (ref 30.0–36.0)
MCV: 93.6 fL (ref 80.0–100.0)
Monocytes Absolute: 1 10*3/uL (ref 0.1–1.0)
Monocytes Relative: 8 %
Neutro Abs: 10.2 10*3/uL — ABNORMAL HIGH (ref 1.7–7.7)
Neutrophils Relative %: 81 %
Platelets: 336 10*3/uL (ref 150–400)
RBC: 3.42 MIL/uL — ABNORMAL LOW (ref 4.22–5.81)
RDW: 20 % — ABNORMAL HIGH (ref 11.5–15.5)
WBC: 12.6 10*3/uL — ABNORMAL HIGH (ref 4.0–10.5)
nRBC: 0 % (ref 0.0–0.2)

## 2020-02-28 LAB — COOXEMETRY PANEL
Carboxyhemoglobin: 2.1 % — ABNORMAL HIGH (ref 0.5–1.5)
Methemoglobin: 0.9 % (ref 0.0–1.5)
O2 Saturation: 56.6 %
Total hemoglobin: 9.4 g/dL — ABNORMAL LOW (ref 12.0–16.0)

## 2020-02-28 LAB — MAGNESIUM: Magnesium: 2.4 mg/dL (ref 1.7–2.4)

## 2020-02-28 LAB — TYPE AND SCREEN
ABO/RH(D): A POS
Antibody Screen: NEGATIVE

## 2020-02-28 LAB — HEPARIN LEVEL (UNFRACTIONATED): Heparin Unfractionated: 0.41 IU/mL (ref 0.30–0.70)

## 2020-02-28 MED ORDER — PIVOT 1.5 CAL PO LIQD
1000.0000 mL | ORAL | Status: DC
  Administered 2020-02-28 – 2020-03-08 (×10): 1000 mL
  Filled 2020-02-28 (×2): qty 1000

## 2020-02-28 NOTE — Progress Notes (Signed)
[facility] NEPHROLOGY PROGRESS NOTE  Assessment/ Plan: Pt is a 50 y.o. yo male  with familial cardiomyopathy EF20% s/p redo CABG 97/67/34 complicated by persistent shock, multiple attempts at sternal closure with formation of chest hematoma and tamponade, Afib with RVR, and AKI requiring CRRT.    # Anuric AKI/CKD stage III- presumably due to ischemic ATN in setting of cardiogenic shock s/p CABG.  CRRT started 01/16/20.  All fluids 4K/2.5Ca.  RIJ on 01/28/20. Continue CRRT, no prescription change, UF is net negative as tolerated- cont this rate.  Hyperkalemia improved.  The line was changed to right femoral catheter- has been in for 9 days.  He is on inotropic support currently.  At some point need to see if he will tolerate iHD but in light of still open chest we will continue CRRT for now so we don't get too far behind with I/Os at this point -- his intake is typically ~4L/day so would need to tolerate fairly robust UF with HD which I'm not sure he will .   Another issue to clarify for disposition is if LTACH can do dobutamine + dialysis.   # CAD s/p CABG complicated by cardiogenic shock and post-op hematoma. secondary closure on 12/23 then had to be re opened. Went to the OR for washout on 12/28, bedside washout 02/01/19.  OR 02/25/2020 for closure, then had emergent reopening at the bedside 02/21/2020 d/t anterior chest hematoma again. S/p OR plastic surgery 1/27 - excision of wound, 2cm closure.   # Cardiogenic shock- underlying familial cardiomyopathy s/p redo CABG, EF ~20%. Remains on inotrope,  Midodrine 20 tid.  The Impella was removed on 1/21.  # ABLA- s/p postoperative bleeding into chest cavity s/p re-exploration for tamponade/hematoma. Received multiple blood products. On ESA- no heparin  # Atrial fibrillation - s/p DCCV on 12/16. On amiodarone.   # VDRF- per CHF/PCCMsvc. Trach/peg 1/10  Elytes-  Phos 2 but cannot replete with sodium phos-  K is OK on 4 k bath    Subjective: Seen and examined in ICU.    Running CRRT.  On vent.  Remains critically ill without any new event.  I/Os 3.6 / 4.9.  Discussed with RN- changed filter times one overnight .    Objective Vital signs in last 24 hours: Vitals:   02/28/20 0445 02/28/20 0500 02/28/20 0600 02/28/20 0700  BP: 104/74 110/69 107/70 (!) 91/58  Pulse: 76 73 80 63  Resp: 16 20 (!) 21 (!) 24  Temp:      TempSrc:      SpO2: 100% 100% 100% 100%  Weight:  84.6 kg    Height:       Weight change: -2.9 kg  Intake/Output Summary (Last 24 hours) at 02/28/2020 0710 Last data filed at 02/28/2020 0700 Gross per 24 hour  Intake 3567.58 ml  Output 4989 ml  Net -1421.42 ml    Labs: Basic Metabolic Panel: Recent Labs  Lab 02/27/20 0436 02/27/20 1543 02/28/20 0331  NA 134* 136 135  K 4.0 4.5 4.3  CL 101 102 102  CO2 '26 26 26  ' GLUCOSE 156* 154* 133*  BUN 43* 46* 45*  CREATININE 0.94 0.95 0.90  CALCIUM 8.0* 8.1* 7.9*  PHOS 1.9* 2.1* 2.0*   Liver Function Tests: Recent Labs  Lab 02/27/20 0436 02/27/20 1543 02/28/20 0331  ALBUMIN 1.9* 2.0* 1.9*   No results for input(s): LIPASE, AMYLASE in the last 168 hours. No results for input(s): AMMONIA in the last 168 hours. CBC:  Recent Labs  Lab 02/01/2020 0322 02/25/20 0417 02/26/20 0402 02/27/20 0436 02/28/20 0332  WBC 15.9* 13.4* 15.5* 15.3* 12.6*  NEUTROABS 13.2* 11.1* 12.9* 12.6* 10.2*  HGB 9.1* 8.8* 8.9* 8.9* 9.1*  HCT 31.5* 31.4* 31.0* 31.4* 32.0*  MCV 91.6 93.5 93.4 93.2 93.6  PLT 362 332 321 339 336   Cardiac Enzymes: No results for input(s): CKTOTAL, CKMB, CKMBINDEX, TROPONINI in the last 168 hours. CBG: Recent Labs  Lab 02/27/20 1540 02/27/20 1950 02/28/20 0001 02/28/20 0026 02/28/20 0357  GLUCAP 144* 201* 163* 146* 119*    Iron Studies: No results for input(s): IRON, TIBC, TRANSFERRIN, FERRITIN in the last 72 hours. Studies/Results: No results found.  Medications: Infusions: .  prismasol BGK 4/2.5 500 mL/hr at  02/28/20 0144  .  prismasol BGK 4/2.5 200 mL/hr at 02/28/20 0143  . sodium chloride 10 mL/hr at 02/28/20 0700  . sodium chloride    . dexmedetomidine (PRECEDEX) IV infusion 0.7 mcg/kg/hr (02/28/20 0700)  . dextrose Stopped (02/06/2020 1846)  . DOBUTamine 5 mcg/kg/min (02/28/20 0700)  . feeding supplement (PIVOT 1.5 CAL) 1,000 mL (02/27/20 2149)  . heparin 1,800 Units/hr (02/28/20 0700)  . HYDROmorphone 2 mg/hr (02/28/20 0700)  . lactated ringers 20 mL/hr at 02/28/20 0700  . prismasol BGK 4/2.5 1,500 mL/hr at 02/28/20 0403  . vancomycin Stopped (02/27/20 1639)    Scheduled Medications: . sodium chloride   Intravenous Once  . amiodarone  200 mg Per Tube BID  . vitamin C  500 mg Per Tube TID  . aspirin  324 mg Per Tube Daily  . atorvastatin  80 mg Per Tube Daily  . busPIRone  10 mg Per Tube TID  . chlorhexidine gluconate (MEDLINE KIT)  15 mL Mouth Rinse BID  . Chlorhexidine Gluconate Cloth  6 each Topical Daily  . darbepoetin (ARANESP) injection - NON-DIALYSIS  100 mcg Subcutaneous Q Sat-1800  . feeding supplement (PROSource TF)  45 mL Per Tube TID  . fiber  1 packet Per Tube BID  . FLUoxetine  40 mg Per Tube BID  . hydrocortisone sod succinate (SOLU-CORTEF) inj  25 mg Intravenous Q12H  . HYDROmorphone  4 mg Per Tube Q6H  . insulin aspart  0-9 Units Subcutaneous Q4H  . insulin aspart  4 Units Subcutaneous Q4H  . insulin detemir  12 Units Subcutaneous Q12H  . LORazepam  1 mg Intravenous Q4H  . mouth rinse  15 mL Mouth Rinse 10 times per day  . melatonin  5 mg Per Tube Daily  . mexiletine  150 mg Per Tube Q8H  . midodrine  20 mg Per Tube TID WC  . multivitamin  1 tablet Per Tube QHS  . nutrition supplement (JUVEN)  1 packet Per Tube BID BM  . pantoprazole (PROTONIX) IV  40 mg Intravenous Q12H  . sodium chloride flush  10-40 mL Intracatheter Q12H  . thiamine  100 mg Per Tube Daily  . valproic acid  250 mg Per Tube BID    have reviewed scheduled and prn medications.  Physical  Exam: General: Critically ill looking male sedated and on vent via trach.  Heart:RRR, s1s2 nl Lungs: Coarse breath sound bilateral Abdomen:soft, Non-tender, non-distended Extremities pitting edema to dep areas Dialysis Access:  right femoral catheter site clean.  Corey Palmer Riano 02/28/2020,7:10 AM  LOS: 60 days

## 2020-02-28 NOTE — Progress Notes (Signed)
Occupational Therapy Treatment Patient Details Name: Corey Palmer MRN: [id number] DOB: 1970/09/28 Today's Date: 02/28/2020    History of present illness 50 yo male admitted 12/1 with A. fib/RVR, torsades, cardiogenic shock (EF 15) requiring Impella placement (removed 1/21), CABG x 4 c/b cardiac tamponade requiring chest reopening (now with VAC), AKI requiring initiation of CRRT. PMH including anxiety, arthritis, CAD, CHF, DM type ll, HTN, obesity, pacemaker, paroxysmal a-fib, and sleep apnea.   OT comments  This 50 yo male seen today for exercises x4 extremities (limited on RLE due to hemo femoral cath). Pt attempting to A with exercises today with increased time and cues. CRRT continues and pt with open chest wound so limited to bed level activities only. He will continue to benefit from acute OT with follow up on LTACH.  Follow Up Recommendations  LTACH;Supervision/Assistance - 24 hour    Equipment Recommendations  Other (comment);Wheelchair (measurements OT);Wheelchair cushion (measurements OT);3 in 1 bedside commode;Hospital bed (hoyer lift)       Precautions / Restrictions Precautions Precautions: Fall;Sternal Precaution Comments: CRRT via R groin line, Vent on 5 of peep, wound vac on chest, continuous O2 at 30% Restrictions Weight Bearing Restrictions: Yes (Sternal precautions) Other Position/Activity Restrictions: sternal precautions              ADL either performed or assessed with clinical judgement   ADL                                         General ADL Comments: Total A for ADLs and bed mobility     Vision Baseline Vision/History: Wears glasses Wears Glasses: At all times Patient Visual Report:  (unsure--pt tends to keep eyes closed, will open when asked intermittenlty)            Cognition Arousal/Alertness: Awake/alert Behavior During Therapy: Flat affect Overall Cognitive Status: Impaired/Different from baseline Area of Impairment:  Following commands                       Following Commands: Follows one step commands inconsistently       General Comments: Kept eye closed but assisted me with about 25% of ROM to extremties x4        Exercises Other Exercises Other Exercises: P/AAROM to all 4 extremties x 10reps with limting RLE to only hip abduction/adduction and ankle pumps. Pt displayed 1-2/5 strength throughout with needing consistent cues and increased time to continue to help with ROM. Pt hands are not swollen today but are stiff. He is moving both UEs spontaneously but not purposefully. Wife reports he does have history of gout in Bil elbows, right hand, Bil knees and Bil feet.           Pertinent Vitals/ Pain       Pain Assessment: Faces Faces Pain Scale: Hurts worst Pain Location: left knee flexion Pain Descriptors / Indicators: Grimacing;Guarding Pain Intervention(s): Limited activity within patient's tolerance;Monitored during session;Repositioned         Frequency  Min 2X/week        Progress Toward Goals  OT Goals(current goals can now be found in the care plan section)  Progress towards OT goals: Progressing toward goals (spontaneous movement and following 25 % of commands to A with ROM x4 extremities.)  Acute Rehab OT Goals Patient Stated Goal: unstated OT Goal Formulation: Patient unable to participate in goal  setting Time For Goal Achievement: 03/07/20 Potential to Achieve Goals: [place] Discharge plan remains appropriate       AM-PAC OT "6 Clicks" Daily Activity     Outcome Measure   Help from another person eating meals?: Total Help from another person taking care of personal grooming?: Total Help from another person toileting, which includes using toliet, bedpan, or urinal?: Total Help from another person bathing (including washing, rinsing, drying)?: Total Help from another person to put on and taking off regular upper body clothing?: Total Help from another  person to put on and taking off regular lower body clothing?: Total 6 Click Score: 6    End of Session Equipment Utilized During Treatment: Oxygen (30% on vent)  OT Visit Diagnosis: Other abnormalities of gait and mobility (R26.89);Muscle weakness (generalized) (M62.81);Pain Pain - Right/Left: Left Pain - part of body: Knee   Activity Tolerance Patient tolerated treatment well   Patient Left in bed;with call bell/phone within reach;with family/visitor present (wife)           Time: 9179-1505 OT Time Calculation (min): 25 min  Charges: OT General Charges $OT Visit: 1 Visit OT Treatments $Therapeutic Exercise: 23-37 mins  [place] Circle, OTR/L Acute [employer] Pager [phone] Office [phone]      Almon Register 02/28/2020, 11:25 AM

## 2020-02-28 NOTE — Progress Notes (Signed)
[facility] for Heparin Indication: Afib  Allergies  Allergen Reactions  . Orange Fruit Anaphylaxis, Hives and Other (See Comments)    Per Pt- Blisters around lips and Hives all over, also  . Penicillins Anaphylaxis    Did it involve swelling of the face/tongue/throat, SOB, or low BP? Yes Did it involve sudden or severe rash/hives, skin peeling, or any reaction on the inside of your mouth or nose? Yes Did you need to seek medical attention at a hospital or doctor's office? No When did it last happen?childhood If all above answers are "NO", may proceed with cephalosporin use.  [name] [Empagliflozin] Itching  . Basaglar Kwikpen [Insulin Glargine] Nausea And Vomiting    Patient Measurements: Height: 6\' 3"  (190.5 cm) Weight: 84.6 kg (186 lb 8.2 oz) IBW/kg (Calculated) : 84.5 Heparin dosing wt: 101 kg  Vital Signs: Temp: 97.2 F (36.2 C) (01/31 0811) Temp Source: Axillary (01/31 0811) BP: 109/71 (01/31 1300) Pulse Rate: 81 (01/31 1300)  Labs: Recent Labs    02/26/20 0402 02/26/20 1446 02/27/20 0436 02/27/20 1543 02/28/20 0331 02/28/20 0332  HGB 8.9*  --  8.9*  --   --  9.1*  HCT 31.0*  --  31.4*  --   --  32.0*  PLT 321  --  339  --   --  336  HEPARINUNFRC 0.23* 0.27* 0.33  --   --  0.41  CREATININE 0.99 1.00 0.94 0.95 0.90  --     Estimated Creatinine Clearance: 116.1 mL/min (by C-G formula based on SCr of 0.9 mg/dL).   Assessment: 63 yoM with hx PAF on apixaban PTA. Pt admitted with shock and required Impella 5.5 support. CABG 12/15 with chest left open, c/b hematoma and significant bleeding. Ultimately heparin started via purge for Impella. DCCV performed 12/16, partial chest closure 1/5, Impella removed 1/21. Heparin resumed for AFib 1/22.  Heparin level at upper end of goal 0.41. CBC stable, no bleeding or infusion issues noted. Will adjust rate slightly.   Goal of Therapy:  Heparin level ~0.3  Monitor platelets by  anticoagulation protocol: Yes   Plan:  Decrease heparin to 1750 units/h Monitor daily HL, CBC, s/sx bleeding  Erin Hearing PharmD., BCPS Clinical Pharmacist 02/28/2020 2:12 PM   Please check [email or web address] for unit-specific pharmacy phone numbers.

## 2020-02-28 NOTE — TOC Progression Note (Addendum)
Transition of Care [facility]) - Progression Note    Patient Details  Name: Corey Palmer MRN: [id number] Date of Birth: 09-08-1970  Transition of [facility] CM/SW Contact  Sharin Mons, RN Phone Number: 02/28/2020, 11:39 AM  Clinical Narrative:    NCM received consult: Need to start looking into LTACH facilities. He will have tracheostomy, HD, and dobutamine (home inotrope). Will there be LTACH options for him?  NCM spoke with pt's wife Corey Palmer @ bedside regarding pt transitioning to LTAC. Corey Palmer is  agreeable to LTAC once medically ready. NCM offered choice ( [facility] vs [facility]).  Kelli without preference. NCM reached out to both LTACs by phone  for reviewing  to determine which facility can accommodate pt's needs. [facility] /[facility]enn[facility] to f/u with wife to discuss [facility]  and the support they can provide.  Voice message left with [facility] liaison.  TOC team will continue to monitor and follow for needs....   02/28/2020 @ 12:28 Call received from [employer]. Raquel Sarna stated slim chance for admittance 2/2 care needs.Marland KitchenMarland KitchenMarland KitchenWill follow for renal recovery or respiratory improvement for possible decannulation.  Expected Discharge Plan: Long Term Acute Care (LTAC) Barriers to Discharge: Continued Medical Work up  Expected Discharge Plan and Services Expected Discharge Plan: [place] (LTAC)                                               Social Determinants of Health (SDOH) Interventions    Readmission Risk Interventions No flowsheet data found.

## 2020-02-28 NOTE — Progress Notes (Signed)
4 Days Post-Op  Subjective: Patient is on vent and continuous dialysis.   Objective: Vital signs in last 24 hours: Temp:  [96.8 F (36 C)-97.8 F (36.6 C)] 97.2 F (36.2 C) (01/31 0811) Pulse Rate:  [62-95] 70 (01/31 0900) Resp:  [10-28] 23 (01/31 0900) BP: (88-114)/(54-77) 97/57 (01/31 0900) SpO2:  [95 %-100 %] 100 % (01/31 0900) FiO2 (%):  [30 %] 30 % (01/31 0417) Weight:  [84.6 kg] 84.6 kg (01/31 0500) Last BM Date: 02/27/20  Intake/Output from previous day: 01/30 0701 - 01/31 0700 In: 3567.6 [I.V.:1697.5; NG/GT:1410; IV Piggyback:200.1] Out: 4989 [Drains:250; Stool:700] Intake/Output this shift: Total I/O In: 302.1 [I.V.:152.1; Other:150] Out: 179 [Other:179]  General appearance: ill-appearing, on continuous dialysis and vent Head: Normocephalic, without obvious abnormality, atraumatic Resp: on vent Chest wall: Sternal wound VAC in place with no appreciable leaks; 100 mmHh; 150 cc of serosanguinous fluid in canister  Lab Results:  CBC    Component Value Date/Time   WBC 12.6 (H) 02/28/2020 0332   RBC 3.42 (L) 02/28/2020 0332   HGB 9.1 (L) 02/28/2020 0332   HGB 16.2 03/23/2018 1535   HCT 32.0 (L) 02/28/2020 0332   HCT 47.0 03/23/2018 1535   PLT 336 02/28/2020 0332   PLT 251 03/23/2018 1535   MCV 93.6 02/28/2020 0332   MCV 82 03/23/2018 1535   MCH 26.6 02/28/2020 0332   MCHC 28.4 (L) 02/28/2020 0332   RDW 20.0 (H) 02/28/2020 0332   RDW 15.0 03/23/2018 1535   LYMPHSABS 1.2 02/28/2020 0332   LYMPHSABS 2.5 03/23/2018 1535   MONOABS 1.0 02/28/2020 0332   EOSABS 0.1 02/28/2020 0332   EOSABS 0.2 03/23/2018 1535   BASOSABS 0.0 02/28/2020 0332   BASOSABS 0.0 03/23/2018 1535   BMET Recent Labs    02/27/20 1543 02/28/20 0331  NA 136 135  K 4.5 4.3  CL 102 102  CO2 26 26  GLUCOSE 154* 133*  BUN 46* 45*  CREATININE 0.95 0.90  CALCIUM 8.1* 7.9*   PT/INR No results for input(s): LABPROT, INR in the last 72 hours. ABG No results for input(s): PHART, HCO3  in the last 72 hours.  Invalid input(s): PCO2, PO2  Studies/Results: DG CHEST PORT 1 VIEW  Result Date: 02/28/2020 CLINICAL DATA:  Respiratory failure EXAM: PORTABLE CHEST 1 VIEW COMPARISON:  02/23/2020 FINDINGS: Interval removal of right-sided chest tube. Tracheostomy tube remains in place. Left-sided implanted cardiac device. Right-sided PICC line terminates within the SVC. Stable cardiomegaly. Stable pleural based density at the right lung apex. Lungs otherwise clear. No pneumothorax. IMPRESSION: Interval removal of right-sided chest tube. No pneumothorax. Electronically Signed   By: Davina Poke D.O.   On: 02/28/2020 07:56    Anti-infectives: Anti-infectives (From admission, onward)   Start     Dose/Rate Route Frequency Ordered Stop   02/20/20 2000  anidulafungin (ERAXIS) 100 mg in sodium chloride 0.9 % 100 mL IVPB        100 mg 78 mL/hr over 100 Minutes Intravenous Every 24 hours 02/19/20 1425 02/25/20 2125   02/19/20 1600  vancomycin (VANCOCIN) IVPB 1000 mg/200 mL premix        1,000 mg 200 mL/hr over 60 Minutes Intravenous Every 24 hours 02/19/20 1056 02/29/20 1559   02/19/20 1600  anidulafungin (ERAXIS) 200 mg in sodium chloride 0.9 % 200 mL IVPB        200 mg 78 mL/hr over 200 Minutes Intravenous  Once 02/19/20 1425 02/19/20 2045   02/19/20 1400  vancomycin (VANCOCIN) IVPB 1000 mg/200  mL premix  Status:  Discontinued        1,000 mg 200 mL/hr over 60 Minutes Intravenous Every 24 hours 02/22/2020 1312 02/19/2020 1633   02/19/20 1400  meropenem (MERREM) 1 g in sodium chloride 0.9 % 100 mL IVPB        1 g 200 mL/hr over 30 Minutes Intravenous Every 8 hours 02/19/20 1056 02/25/20 2211   01/31/2020 1800  meropenem (MERREM) 1 g in sodium chloride 0.9 % 100 mL IVPB  Status:  Discontinued        1 g 200 mL/hr over 30 Minutes Intravenous Every 12 hours 02/06/2020 1620 02/19/20 1056   02/07/2020 1400  ceFEPIme (MAXIPIME) 2 g in sodium chloride 0.9 % 100 mL IVPB  Status:  Discontinued         2 g 200 mL/hr over 30 Minutes Intravenous Every 12 hours 02/21/2020 1312 02/12/2020 1620   02/09/2020 1400  vancomycin (VANCOREADY) IVPB 2000 mg/400 mL        2,000 mg 200 mL/hr over 120 Minutes Intravenous  Once 02/21/2020 1312 02/23/2020 1827   02/12/2020 0859  vancomycin (VANCOCIN) 1,000 mg in sodium chloride 0.9 % 1,000 mL irrigation  Status:  Discontinued          As needed 02/19/2020 0859 02/26/2020 0927   02/21/2020 0800  levofloxacin (LEVAQUIN) IVPB 500 mg        500 mg 100 mL/hr over 60 Minutes Intravenous To Surgery 02/12/2020 0758 02/10/2020 0905   02/12/2020 1115  vancomycin (VANCOREADY) IVPB 1500 mg/300 mL  Status:  Discontinued        1,500 mg 150 mL/hr over 120 Minutes Intravenous NOW 02/18/2020 1103 02/15/2020 1245   02/17/2020 1400  vancomycin (VANCOCIN) IVPB 1000 mg/200 mL premix  Status:  Discontinued        1,000 mg 200 mL/hr over 60 Minutes Intravenous Every 24 hours 02/01/20 1011 02/09/20 0957   02/01/20 1400  meropenem (MERREM) 1 g in sodium chloride 0.9 % 100 mL IVPB  Status:  Discontinued        1 g 200 mL/hr over 30 Minutes Intravenous Every 8 hours 02/01/20 1011 02/09/20 0957   02/01/20 1100  vancomycin (VANCOREADY) IVPB 2000 mg/400 mL        2,000 mg 200 mL/hr over 120 Minutes Intravenous  Once 02/01/20 1011 02/01/20 1327   01/28/20 1300  vancomycin (VANCOCIN) IVPB 1000 mg/200 mL premix  Status:  Discontinued        1,000 mg 200 mL/hr over 60 Minutes Intravenous Every 24 hours 01/27/20 1430 01/28/20 0830   01/24/20 1300  vancomycin (VANCOCIN) IVPB 1000 mg/200 mL premix  Status:  Discontinued        1,000 mg 200 mL/hr over 60 Minutes Intravenous Every 24 hours 01/24/20 0846 01/27/20 1430   01/22/20 1601  vancomycin variable dose per unstable renal function (pharmacist dosing)  Status:  Discontinued         Does not apply See admin instructions 01/22/20 1601 01/24/20 0846   01/21/20 1200  vancomycin (VANCOREADY) IVPB 1250 mg/250 mL  Status:  Discontinued        1,250 mg 166.7 mL/hr over  90 Minutes Intravenous Every 24 hours 01/11/2020 1620 01/22/20 1457   01/27/2020 1800  meropenem (MERREM) 1 g in sodium chloride 0.9 % 100 mL IVPB  Status:  Discontinued        1 g 200 mL/hr over 30 Minutes Intravenous Every 8 hours 01/27/2020 1800 01/28/20 0830   01/19/2020 1348  vancomycin (  VANCOCIN) 1,000 mg in sodium chloride 0.9 % 1,000 mL irrigation  Status:  Discontinued          As needed 01/12/2020 1349 01/03/2020 1611   01/27/2020 1015  vancomycin (VANCOREADY) IVPB 1500 mg/300 mL        1,500 mg 150 mL/hr over 120 Minutes Intravenous On call to O.R. 01/16/2020 0927 01/12/2020 1556   01/19/20 1700  vancomycin (VANCOREADY) IVPB 1250 mg/250 mL  Status:  Discontinued        1,250 mg 166.7 mL/hr over 90 Minutes Intravenous Every 24 hours 01/18/20 1618 12/30/2019 1620   01/16/20 1700  vancomycin (VANCOREADY) IVPB 1500 mg/300 mL  Status:  Discontinued        1,500 mg 150 mL/hr over 120 Minutes Intravenous Every 24 hours 01/16/20 1121 01/18/20 1618   01/15/20 1700  vancomycin (VANCOREADY) IVPB 750 mg/150 mL  Status:  Discontinued        750 mg 150 mL/hr over 60 Minutes Intravenous Every 12 hours 01/15/20 1107 01/16/20 1121   01/23/2020 1535  vancomycin (VANCOCIN) powder  Status:  Discontinued          As needed 01/23/2020 1536 01/01/2020 1619   01/21/2020 1400  meropenem (MERREM) 1 g in sodium chloride 0.9 % 100 mL IVPB  Status:  Discontinued        1 g 200 mL/hr over 30 Minutes Intravenous Every 8 hours 01/12/2020 1007 01/18/2020 1800   01/18/2020 1400  levofloxacin (LEVAQUIN) IVPB 500 mg  Status:  Discontinued        500 mg 100 mL/hr over 60 Minutes Intravenous To Surgery 12/29/2019 1354 01/08/2020 1619   01/12/2020 1345  vancomycin (VANCOREADY) IVPB 1500 mg/300 mL  Status:  Discontinued        1,500 mg 150 mL/hr over 120 Minutes Intravenous To Surgery 01/09/2020 1340 01/28/2020 1619   01/04/2020 1345  cefUROXime (ZINACEF) 1.5 g in sodium chloride 0.9 % 100 mL IVPB  Status:  Discontinued        1.5 g 200 mL/hr over 30 Minutes  Intravenous To Surgery 01/08/2020 1340 01/25/2020 1353   12/29/2019 1345  cefUROXime (ZINACEF) 750 mg in sodium chloride 0.9 % 100 mL IVPB  Status:  Discontinued        750 mg 200 mL/hr over 30 Minutes Intravenous To Surgery 01/14/2020 1340 01/20/2020 1353   01/10/2020 1002  vancomycin (VANCOCIN) IVPB 1000 mg/200 mL premix  Status:  Discontinued        1,000 mg 200 mL/hr over 60 Minutes Intravenous Every 12 hours 01/06/2020 0948 01/15/20 1049   01/22/2020 1000  levofloxacin (LEVAQUIN) IVPB 750 mg        750 mg 100 mL/hr over 90 Minutes Intravenous Every 24 hours 12/31/2019 1512 01/26/2020 1900   01/06/2020 2130  vancomycin (VANCOCIN) IVPB 1000 mg/200 mL premix        1,000 mg 200 mL/hr over 60 Minutes Intravenous  Once 01/15/2020 1512 01/15/2020 1718   01/10/2020 1018  vancomycin (VANCOCIN) powder  Status:  Discontinued          As needed 01/28/2020 1019 01/22/2020 1511   01/07/2020 0400  vancomycin (VANCOREADY) IVPB 1500 mg/300 mL        1,500 mg 150 mL/hr over 120 Minutes Intravenous To Surgery 01/11/20 1357 01/20/2020 1100   12/31/2019 0400  levofloxacin (LEVAQUIN) IVPB 500 mg        500 mg 100 mL/hr over 60 Minutes Intravenous To Surgery 01/11/20 1357 12/31/2019 1000      Assessment/Plan:  s/p Procedure(s): Sternal wound debridement APPLICATION OF A-CELL Wound vac change  Plan to teturn to OR on Wednesday (03/10/2020) for Wound VAC change, application of dermal matrix, and additional debridement.    LOS: 60 days    Threasa Heads, [place] 02/28/2020

## 2020-02-28 NOTE — Progress Notes (Addendum)
Patient ID: GEORGES VICTORIO, male   DOB: 09/02/1970, 50 y.o.   MRN: [id number]     Advanced Heart Failure Rounding Note  PCP-Cardiologist: No primary care provider on file.   Subjective:    - 12/7 Torsades -ICD shock x1.  - 12/11 Impella 5.5 placed.  - 12/12 Impella repositioned in OR - 12/15 CABG x 4 with LIMA-LAD, SVG-PDA/PLV, SVG-OM - 12/16 DCCV afib.  Back to OR to re-open chest and again to evacuate hematoma.  - 12/19 CVVH initiated  - 12/23 Chest closed - 12/24 Chest reopened - 12/26 RAP back into NSR - 12/27 back in atrial fibrillation, unable to rapid atrial pace out.   - 12/28 To OR, chest partially closed and wound vac placed.  DCCV to NSR.  - 12/31 Antibiotics stopped. Swan removed, HD catheter moved from right femoral to right IJ.  - 1/5 To OR for chest closure. Chest reopened emergently at bedside again to evacuate hematoma  - 1/10 To OR: omental flap closure of chest with wound vac, tracheostomy, G-tube, J-tube.    - 1/12 Plastics and EP consulted.  - 1/13 Palliative Care consulted.  - 1/15 Went into VT, Shock 200 J and amiodarone bolus 150.   - 1/16: Milrinone stopped.    - 1/17: Partial closure of chest in OR - 1/18 Switched to dobutamine 4 mcg - 1/21 Impella Extracted.  Developed hyperkalemia when CVVH held with wide QRS rhythm and required transient external pacing.   - 1/27 OR for wound vac change, partial chest wall closure.   Co-ox 57% on dobutamine 5, off pressors. Remains on CVVHD. On vent with trach.  Wakes to verbal stimuli, able to follow commands.    Objective:   Weight Range: 87.5 kg Body mass index is 24.11 kg/m.   Vital Signs:   Temp:  [96.8 F (36 C)-97.8 F (36.6 C)] 97.6 F (36.4 C) (01/31 0000) Pulse Rate:  [63-95] 65 (01/31 0300) Resp:  [10-28] 24 (01/31 0300) BP: (88-117)/(55-75) 95/57 (01/31 0300) SpO2:  [95 %-100 %] 100 % (01/31 0417) FiO2 (%):  [30 %] 30 % (01/31 0417) Weight:  [87.5 kg] 87.5 kg (01/30 0500) Last BM Date:  02/27/20  Weight change: Filed Weights   02/25/20 0500 02/26/20 0500 02/27/20 0500  Weight: 89 kg 88.7 kg 87.5 kg    Intake/Output:   Intake/Output Summary (Last 24 hours) at 02/28/2020 0421 Last data filed at 02/28/2020 0300 Gross per 24 hour  Intake 3391.87 ml  Output 4869 ml  Net -1477.13 ml      Physical Exam   CVP 3 General:  Chronically ill.  HEENT: Trach, dry MM.  Neck: supple. No JVD.  Cor: Regular rate & rhythm. No rubs, gallops or murmurs. Lungs: Diminished lower bases.  Abdomen: soft, nontender, + distended. Good bowel sounds. Extremities: no cyanosis, clubbing, rash, edema Neuro: alert to verbal stimuli, sedated.   Telemetry   SR with rates in the 60s. Personally reviewed.   Labs    CBC Recent Labs    02/27/20 0436 02/28/20 0332  WBC 15.3* 12.6*  NEUTROABS 12.6* 10.2*  HGB 8.9* 9.1*  HCT 31.4* 32.0*  MCV 93.2 93.6  PLT 339 333   Basic Metabolic Panel Recent Labs    02/26/20 0402 02/26/20 1446 02/27/20 0436 02/27/20 1543  NA 131*   < > 134* 136  K 3.8   < > 4.0 4.5  CL 100   < > 101 102  CO2 24   < > 26  26  GLUCOSE 156*   < > 156* 154*  BUN 41*   < > 43* 46*  CREATININE 0.99   < > 0.94 0.95  CALCIUM 7.9*   < > 8.0* 8.1*  MG 2.3  --  2.5*  --   PHOS 2.3*   < > 1.9* 2.1*   < > = values in this interval not displayed.   Liver Function Tests Recent Labs    02/27/20 0436 02/27/20 1543  ALBUMIN 1.9* 2.0*   No results for input(s): LIPASE, AMYLASE in the last 72 hours. Cardiac Enzymes No results for input(s): CKTOTAL, CKMB, CKMBINDEX, TROPONINI in the last 72 hours.  BNP: BNP (last 3 results) Recent Labs    01/06/2020 1619  BNP 577.5*    ProBNP (last 3 results) No results for input(s): PROBNP in the last 8760 hours.   D-Dimer No results for input(s): DDIMER in the last 72 hours. Hemoglobin A1C No results for input(s): HGBA1C in the last 72 hours. Fasting Lipid Panel No results for input(s): CHOL, HDL, LDLCALC, TRIG,  CHOLHDL, LDLDIRECT in the last 72 hours. Thyroid Function Tests No results for input(s): TSH, T4TOTAL, T3FREE, THYROIDAB in the last 72 hours.  Invalid input(s): FREET3  Other results:   Imaging    No results found.   Medications:     Scheduled Medications: . sodium chloride   Intravenous Once  . amiodarone  200 mg Per Tube BID  . vitamin C  500 mg Per Tube TID  . aspirin  324 mg Per Tube Daily  . atorvastatin  80 mg Per Tube Daily  . busPIRone  10 mg Per Tube TID  . chlorhexidine gluconate (MEDLINE KIT)  15 mL Mouth Rinse BID  . Chlorhexidine Gluconate Cloth  6 each Topical Daily  . darbepoetin (ARANESP) injection - NON-DIALYSIS  100 mcg Subcutaneous Q Sat-1800  . feeding supplement (PROSource TF)  45 mL Per Tube TID  . fiber  1 packet Per Tube BID  . FLUoxetine  40 mg Per Tube BID  . hydrocortisone sod succinate (SOLU-CORTEF) inj  25 mg Intravenous Q12H  . HYDROmorphone  4 mg Per Tube Q6H  . insulin aspart  0-9 Units Subcutaneous Q4H  . insulin aspart  4 Units Subcutaneous Q4H  . insulin detemir  12 Units Subcutaneous Q12H  . LORazepam  1 mg Intravenous Q4H  . mouth rinse  15 mL Mouth Rinse 10 times per day  . melatonin  5 mg Per Tube Daily  . mexiletine  150 mg Per Tube Q8H  . midodrine  20 mg Per Tube TID WC  . multivitamin  1 tablet Per Tube QHS  . nutrition supplement (JUVEN)  1 packet Per Tube BID BM  . pantoprazole (PROTONIX) IV  40 mg Intravenous Q12H  . sodium chloride flush  10-40 mL Intracatheter Q12H  . thiamine  100 mg Per Tube Daily  . valproic acid  250 mg Per Tube BID    Infusions: .  prismasol BGK 4/2.5 500 mL/hr at 02/28/20 0144  .  prismasol BGK 4/2.5 200 mL/hr at 02/28/20 0143  . sodium chloride 10 mL/hr at 02/28/20 0300  . sodium chloride    . dexmedetomidine (PRECEDEX) IV infusion 0.7 mcg/kg/hr (02/28/20 0300)  . dextrose Stopped (02/19/2020 1846)  . DOBUTamine 5 mcg/kg/min (02/28/20 0300)  . feeding supplement (PIVOT 1.5 CAL) 1,000 mL  (02/27/20 2149)  . heparin 1,800 Units/hr (02/28/20 0300)  . HYDROmorphone 2 mg/hr (02/28/20 0300)  . lactated ringers 20  mL/hr at 02/28/20 0300  . prismasol BGK 4/2.5 1,500 mL/hr at 02/28/20 0403  . vancomycin Stopped (02/27/20 1639)    PRN Medications: sodium chloride, Place/Maintain arterial line **AND** sodium chloride, artificial tears, bisacodyl **OR** bisacodyl, dextrose, docusate, heparin, heparin, HYDROmorphone, lip balm, loperamide HCl, metoprolol tartrate, midazolam, nystatin, polyethylene glycol, sodium chloride flush     Assessment/Plan   1. Cardiogenic shock/acute on chronic systolic CHF: Initially nonischemic cardiomyopathy.  Possible familial cardiomyopathy as both parents had cardiomyopathy and died at around 49. However, [employer] gene testing did not show any common mutation for cardiomyopathy.  However, this admission noted to have severe 3 vessel disease so suspect component of ischemic cardiomyopathy.  St Jude ICD.  Echo in 8/20 with EF 15% and mildly decreased RV function. Echo this admission with EF < 20%, moderate LV dilation, RV mildly reduced, severe LAE, no significant MR. Low output HF with markedly low EF.  He has a long history of cardiomyopathy (20 yrs), tends to minimize symptoms.  Cardiorenal syndrome with creatinine up to 2.2,  stabilized on milrinone and Impella 5.5. SCr 1.44 day of CABG. CABG 12/15.  Post-op shock, back to OR 12/16 to open chest (chest wall compartment syndrome) and again to evacuate hematoma.  TEE post-op with severe RV dysfunction.  CVVHD initiated 12/19 for volume removal in setting of rising Scr/decreased UOP. Suspect ATN. S/p chest closure 12/23. Hemodynamics much worse on 12/24 with rising pressor demands. Co-ox 36%.  Bedside echo severe biventricular dysfunction with marked septal bounce. Chest re-opened 12/24.  Was atrial paced back to NSR on 12/26 with improvement in hemodynamics but back in atrial fibrillation on 12/27 and unable to pace  out, DCCV to NSR on 12/28. Went back to OR 1/5 for chest closure but several hours later required emergent re-opening of chest at bedside to evacuate hematoma. To OR again 1/10 with closure of chest with omental flap with G & J tube.  1/17 partial closure. 1/21 Impella removed.  Off CVVH post-removal, he developed hyperkalemia and wide QRS rhythm requiring transient external pacing, also with suspected septic shock (low SVR, fever, rising WBCs).   - Off pressors, on dobutamine gtt 5, co-ox 57%.  CVP 3 on CVVH.  - Continue dobutamine 5. Unable to wean.  - Continues on midodrine 20 TID.  - Nephro unsure if he would be able to tolerate iHD.  - Keeping CRRT on to optimize for chest closure - Not candidate to replace Impella (at this point, not candidate for LVAD or transplant).  - Dr. Aundra Dubin has discussed with his wife, will need to show that he can tolerate iHD to be candidate for [facility].  If he cannot, will need to start thinking about comfort care.  2. Atrial fibrillation: H/o PAF.  He was on dofetilide in the remote past but this was stopped due to noncompliance.  He had an upper GI bleed from antral ulcers in 3/12. He was seen by GI and was deemed safe to restart anticoagulation as long as he remains on a PPI.  No apparent recurrence of AF until just prior to this admission, was cardioverted back to NSR in ER but went back into atrial fibrillation.  Post-op afib, DCCV to NSR/BiV pacing am 12/16 -> back to AF. Rapid atrial pacing back to SR. Remains in NSR - Continue amiodarone per tube 200 mg bid.  - Continue heparin gtt.   3. CAD:   Cath this admission with severe 3 vessel disease. CABG x 4 on 12/15. No  s/s angina.  - Continue atorvastatin, ASA.  4. Acute on chronic hypoxemic respiratory failure: Vent per CCM.  Has tracheostomy.  - CCM following, will need to work on slowly weaning vent.  5. AKI on CKD stage 3: Likely ATN/cardiorenal. Anuric. On CVVH.    - Nephro recommendations appreciated. Remains  on CVVHD. 6. Diabetes: Insulin.  7. Hyponatremia: Resolved with CVVH.  8. Torsades/ICD shock: 12/7/212 in hospital event, in setting of severe hypokalemia and hypomagnesemia.   9. Gout: h/o gout. Complained of rt knee pain c/w previous flares - treated w/ prednisone burst (completed).  10: ID: Septic shock 1/21 with fever, low SVR, and elevated WBCs. Cultures no growth with exception of axillary cx on 1/21 grew staph epidermidis.  IJ catheter removed.  - On vancomycin.  Completed anidulafungin & Meropenem on 1/28.  - c/w hydrocortisone to 25 q 12 hrs.  11. FEN: c/w TFs.  12. Anemia: Hgb stable at 8.9  today.  Transfuse < 7.5.   13. Stage II Pressure Ulcer- sacrum: Continue to reposition every 2 hours R/L  14. Pleural Effusion: Loculated rt apical pleural effusion noted on imaging. Continue UF through CVVH.  PCCM following  15. VT: Amiodarone + mexiletine.   16. Deconditioning: Profound. Work with PT, nursing to work on range of motion.   Carlene Coria 02/28/2020 4:21 AM   Patient seen with NP, agree with the above note.   Stable today, remains on CVVH pulling net 50 cc/hr generally.  CVP 8 on my measurement.  Co-ox 57%.  On vancomycin, has completed other abx.  Remains on dobutamine 5 with co-ox 57%.    General: Sedated on vent. NAD Neck: Tracheostomy.  No JVD, no thyromegaly or thyroid nodule.  Lungs: Decreased at bases.  CV: Nondisplaced PMI.  Heart regular S1/S2, no S3/S4, no murmur.  No peripheral edema.  Abdomen: Soft, nontender, no hepatosplenomegaly, no distention.  Skin: Intact without lesions or rashes.  Neurologic: Will wake up on vent, follow commands.  Extremities: No clubbing or cyanosis.  HEENT: Normal.   Plan to continue CVVH until surgeries completed, has surgery again apparently on Wednesday.  After that, will need to try to convert to [facility].  Not sure how well he will tolerate.  CVP around 8, will continue to run net negative 50 cc/hr.    Remains on dobutamine 5,  co-ox 57%.    Off abx except for vancomycin, to complete course soon.   Stop hydrocortisone today.    Profoundly deconditioned, continue range of motion etc.   Will ask social work to look into [facility] options.  Will have trach, HD, and dobutamine.   CRITICAL CARE Performed by: Loralie Champagne  Total critical care time: 35 minutes  Critical care time was exclusive of separately billable procedures and treating other patients.  Critical care was necessary to treat or prevent imminent or life-threatening deterioration.  Critical care was time spent personally by me on the following activities: development of treatment plan with patient and/or surrogate as well as nursing, discussions with consultants, evaluation of patient's response to treatment, examination of patient, obtaining history from patient or surrogate, ordering and performing treatments and interventions, ordering and review of laboratory studies, ordering and review of radiographic studies, pulse oximetry and re-evaluation of patient's condition.  Loralie Champagne 02/28/2020 7:55 AM

## 2020-02-28 NOTE — Progress Notes (Signed)
NAME:  Corey Palmer, MRN:  [id number], DOB:  1970-04-13, LOS: 60 ADMISSION DATE:  01/24/2020, CONSULTATION DATE: 01/25/2020 REFERRING MD: Aundra Dubin CHIEF COMPLAINT: Respiratory failure post Impella  HPI/Course in hospital  50 year old man admitted 12/1 with A. fib/RVR, torsades, cardiogenic shock [EF 15] requiring Impella placement (removed 1/21), CABG x 4 c/b cardiac tamponade requiring chest reopening (now with VAC), AKI requiring on CRRT.  Past Medical History:    has a past medical history of Anginal pain ([place]), Anxiety, Arthritis, Automatic implantable cardioverter-defibrillator in situ (2007), Bipolar disorder ([place]), CAD (coronary artery disease), Cancer ([place]) (2013), CHF (congestive heart failure) ([place]), Chondrocalcinosis of right knee (08/16/9468), Chronic systolic heart failure ([place]), Depression, Diabetes uncomplicated adult-type II, Dilated cardiomyopathy ([place]), Dyslipidemia, Dysrhythmia, GERD (gastroesophageal reflux disease), Gout, unspecified, Hepatic steatosis (2011), History of kidney stones, echocardiogram, Hypertension, Obesity (BMI 30.0-34.9), Pacemaker, Paroxysmal atrial fibrillation ([place]), Peptic ulcer, Shortness of breath, and Sleep apnea.  Significant Hospital Events:  12/2 Afib with RVR. Cardioverted in ED. AICD did not fire. Milrinone for low co ox.  12/7 ICD fired, Torsades- likely from low K of 2.8.  12/8 Cath- multivessel disease w/ low CO. Volume overloaded.  12/11 Underwent Impella 5.5 insertion for persistent symptoms of forward failure with low cardiac indices. 12/12 Back to the OR for displacement of Impella. Extubated.  12/15 CABG x4, with LIMA-LAD, SVG-PDA/PLV, SVG-OM 12/16 Emergently opened chest at bedside for tamponade, clot compressing right atrium  12/16 To OR for Exploration of chest due to bleeding  12/19 Started CRRT 12/23 Sternal Closure in OR 12/24 Worsening shock.  Chest reopened at bedside with improvement in hemodynamics.  No mediastinal  hematoma. 12/25 A flutter > rapid a paced to sinus rhythm.  Remains on high-dose pressors, inotropes 12/28 Weaning vasopressor requirements.  For chest washout today.  Back in atrial fibrillation, refractory to increased amiodarone and attempted overdrive pacing.  DCCV to NSR. Tolerating fluid removal via CRRT. 1/03 Appears euvolemic.  Adjusting glycemic control.  Chest still open so not weaning 1/04 Spiking fever, Vanco meropenem resumed after culture sent from blood and sputum. 1/05 Awaiting return to the OR for wound VAC dressing assessment fever curve and white blood cell count improving after antibiotics resumed the day prior, did have chest closed, developed tamponade physiology shortly after had to go back for emergent reopening of chest, return to intensive care and refractory shock 1/06 Wound VAC dressing replaced.  Still in shock.  Starting to wean sedation 1/07 Still on high-dose pressors, CRRT 1/15 Recurrent Vtach, shocked 1/18 Attempted pressure change to P 3 with addition of Dobutamine . MAP goal > 60 1/19 Ongoing attempts at impella wean, on PS 1/20 Stable on vent, following commands with sedation weaned, continued Impella wean attempt 1/21 Impella removed, on FloTrac, WBC 22K, pancultured, Cefepime/Vanc started empirically for low SVR, concern for sepsis 1/22 antifungals added Impella removed on 1/21, 3 PM CT x3 in place 1/27 OR with plastics for debridement, ACell, partial closure 1/28 plan to stop CRRT when clots off 1/29 CRRT didn't clot off, continues on 1/31 CRRT ongoing, has been failing PSV weans due to tachypnea after about an hour.   Consults:  PCCM, nephrology, cardiology, cardiothoracic surgery, EP  Procedures:  R PICC 12/2 >> R axillary Impella 5.5 12/11 >> 1/21 ETT 12/15 >> 1/10 R femoral arterial sheath 12/15 >> 12/31 R femoral HD cath 12/19 >> 12/31 Chest tube - right pleural, left pleural , mediastinal 12/23 >> 1/5 R Radial A-Line 1/6 >> R fem HD  >  Chest tube - L pleural, R pleural, mediastinal 1/10 >> 1/30 Trach 1/10 > PEG 1/10 >  Significant Diagnostic Tests:  12/8 LHC  Elevated R and L heart filling pressures with low CO, severe 3-vessel disease  Micro Data:  1/19 BCx2 -no growth at 3 days 1/21 OR Cx > staph epi  Antimicrobials:  Levaquin 1/21 (periop ppx) Meropenem 1/21>1/29 Vanc 1/21 >  Anidulafungin 1/22>  1/29  Interval history:  No acute event overnight. CRRT ongoing.   Objective   Blood pressure (!) 93/57, pulse 62, temperature (!) 97.2 F (36.2 C), temperature source Axillary, resp. rate (!) 24, height 6\' 3"  (1.905 m), weight 84.6 kg, SpO2 100 %. CVP:  [4 mmHg-9 mmHg] 8 mmHg  Vent Mode: PRVC FiO2 (%):  [30 %] 30 % Set Rate:  [24 bmp] 24 bmp Vt Set:  [510 mL] 510 mL PEEP:  [5 cmH20] 5 cmH20 Plateau Pressure:  [14 cmH20-23 cmH20] 14 cmH20   Intake/Output Summary (Last 24 hours) at 02/28/2020 0844 Last data filed at 02/28/2020 0800 Gross per 24 hour  Intake 3516.75 ml  Output 5067 ml  Net -1550.25 ml   Filed Weights   02/26/20 0500 02/27/20 0500 02/28/20 0500  Weight: 88.7 kg 87.5 kg 84.6 kg   CVP:  [4 mmHg-9 mmHg] 8 mmHg   Examination:  General: Chronically and critically ill appearing middle aged male on vent.  HEENT: [place]/AT, trach in place. MM dry Neuro: Spontaneously awake, intermittently follows commands.  CV: RRR no MRG midline sternal wound.  PULM: Diminished. Coarse on R.  GI: Soft nontender + bowel sounds  Extremities: No acute deformity skin: pale c/d/w   Assessment & Plan:   Acute hypoxic respiratory failure requiring MV, s/p trach 1/10 Possible HCAP s/p tx with meropenem Small R pleural effusion, persistent  P -cont MV support and wean as able. Has been able to tolerate a few hours of PSV CPAP then has tachypnea requiring PRVC. Will try again today.  -VAP, PAD -pulm hygiene -finished mero and eraxis, on vanc   Cardiogenic shock -s/p impella, removed 1/21 -s/p CABG c/b  tamponade and multiple chest re-openings, prolonged open chest  -s/p excision/debridement + ACell application, partial closure with plastics 1/27 Sternal wound infection: staph epidermis P -per CVTS / HF  -return to OR with plastics next week  -dobutamine, midodrine, trend coox  -Steroids off 1/30 -11 d vanc -trend CBC, fever curve   Afib RVR, recurrent VT, Torsades -- improved  P -ICU monitoring -Follow and replace electrolytes -amio, mexiletine  Acute renal failure in CKD IIIa requiring RRT Cardiorenal syndrome  P -nephro following -plan to continue CRRT through next OR trip (tentatively Wed) so as to not get behind from a volume status -in future, plan to trial iHD. If unable to tolerate iHD will need to re-consider GOC.  Hyponatremia, mild and improving -trend -volume removal per CRRT  Anemia -trend H/H  DM with hyperglycemia -SSI + basal   Deconditioning Dysphagia At risk malnutrition  -EN per RDN -PT/OT when appropriate  -fiber, lomotil   Daily Goals Checklist  Pain/Anxiety/Delirium protocol (if indicated): Precedex/Dilaudid gtt, weaning as able VAP protocol (if indicated): yes DVT prophylaxis:  Heparin gtt Nutrition Status: TF GI prophylaxis: Pantoprazole Glucose control: Insulin (Basal, Standing, SSI) Mobility/therapy needs: per primary Code Status: Full  Family Communication: per primary-- palliative also updating  Disposition: ICU   Georgann Housekeeper, AGACNP-BC [place] for personal pager PCCM on call pager [phone] until  7pm. Please call Elink 7p-7a. [phone]  02/28/2020 8:59 AM

## 2020-02-28 NOTE — Progress Notes (Signed)
Nutrition Follow-up  DOCUMENTATION CODES:   Not applicable  INTERVENTION:   Tube Feeding via J-tube Increase Pivot 1.5 to 75 ml/hr Pro-Source TF 45 mL TID Provides 202 g of protein, 2820 kcals and 1368 mL of free water  Continue Juven BID, each packet provides 80 calories, 8 grams of carbohydrate, 2.5  grams of protein (collagen), 7 grams of L-arginine and 7 grams of L-glutamine; supplement contains CaHMB, Vitamins C, E, B12 and Zinc to promote wound healing  NUTRITION DIAGNOSIS:   Inadequate oral intake related to acute illness as evidenced by NPO status.  Being addressed via TF   GOAL:   Patient will meet greater than or equal to 90% of their needs  Met via nutrition support  MONITOR:   Vent status,Labs,Weight trends,Skin,I & O's  REASON FOR ASSESSMENT:   Consult Enteral/tube feeding initiation and management  ASSESSMENT:   50 yo male admitted with Afib with RVR requiring cardioversion, ischemic CM requiring inotropes and impella support and ultimately underwent CABG x 4 on 12/15 with Impella still in place. PMH includes CAD, benign stomach cancer, CHF, DM, depression, CM, GERD, HTN  12/11 - impella LVAD placed 12/15 - s/p redo sternotomy, CABG on pump x 4, intubated 12/16 - s/p bedside chest exploration for tamponade with moderate amount of anterior mediastinal clot removed, OR for bleeding s/p CABG for mediastinal washout 12/17 - Cortrak placed (duodenal) 12/19 - CRRT initiated 12/23 - sternal closure  12/24 - worsening shock, chest reopened at bedside 12/28 - OR for washout, wound VAC 01/04 - bedside washout 01/05 - OR for sternal washout and closure, reopened later due to compromised hemodynamics due to mediastinal hematoma, wound VAC replaced 01/10 - sternal washout with wound VAC change, greater omental flap closure of sternum, trach, open G-tube, J-tube 01/17 - OR for muscle flap partial sternal closure by TCTS/plastics, placement of VAC sponge and ABRA  device 01/19 - TC trial (~2 hours) 01/21 - OR for impella removal, FloTrac monitoring system placed, TF held 01/23 - TF resumed  Palliative Care following, pt remains Full Code  Noted plan for return to OR on Wed (2/2) for Wound VAC change, application of dermal matrix and further debridement  Pt remains on vent support, sedated but arouses with verbal stimuli, follows command Pt continues on CRRT, did not trial iHD over the weekend. Plan to continue CRRT to optimize fluid status while still needing surgery  Pivot 1.5 at 70 ml/hr, Pro-Source 45 mL TID, Juven BID via J-tube  Current wt 84.6 kg; admit wt 120 kg. Net negative 12 L  Noted several areas of skin breakdown in addition to sternal wound   Labs: phosphorus 2.0 (L) Meds: rena-vite, Vitamin C   Diet Order:   Diet Order    None      EDUCATION NEEDS:   Not appropriate for education at this time  Skin:  Skin Assessment: Skin Integrity Issues: Skin Integrity Issues:: Incisions,Unstageable,Stage II,Stage III,Other (Comment) Stage II: buttocks, throat Stage III: penis Unstageable: throat Incisions: chest, right leg Other: skin tear to back  Last BM:  1/31 rectal tube  Height:   Ht Readings from Last 1 Encounters:  02/06/2020 '6\' 3"'  (1.905 m)    Weight:   Wt Readings from Last 1 Encounters:  02/28/20 84.6 kg    Ideal Body Weight:  89 kg  BMI:  Body mass index is 23.31 kg/m.  Estimated Nutritional Needs:   Kcal:  2700-3000 kcals  Protein:  180-200 grams  Fluid:  >/= 1.5  L   Kerman Passey MS, RDN, LDN, CNSC Registered Dietitian III Clinical Nutrition RD Pager and On-Call Pager Number Located in [place]

## 2020-02-28 NOTE — H&P (View-Only) (Signed)
4 Days Post-Op  Subjective: Patient is on vent and continuous dialysis.   Objective: Vital signs in last 24 hours: Temp:  [96.8 F (36 C)-97.8 F (36.6 C)] 97.2 F (36.2 C) (01/31 0811) Pulse Rate:  [62-95] 70 (01/31 0900) Resp:  [10-28] 23 (01/31 0900) BP: (88-114)/(54-77) 97/57 (01/31 0900) SpO2:  [95 %-100 %] 100 % (01/31 0900) FiO2 (%):  [30 %] 30 % (01/31 0417) Weight:  [84.6 kg] 84.6 kg (01/31 0500) Last BM Date: 02/27/20  Intake/Output from previous day: 01/30 0701 - 01/31 0700 In: 3567.6 [I.V.:1697.5; NG/GT:1410; IV Piggyback:200.1] Out: 4989 [Drains:250; Stool:700] Intake/Output this shift: Total I/O In: 302.1 [I.V.:152.1; Other:150] Out: 179 [Other:179]  General appearance: ill-appearing, on continuous dialysis and vent Head: Normocephalic, without obvious abnormality, atraumatic Resp: on vent Chest wall: Sternal wound VAC in place with no appreciable leaks; 100 mmHh; 150 cc of serosanguinous fluid in canister  Lab Results:  CBC    Component Value Date/Time   WBC 12.6 (H) 02/28/2020 0332   RBC 3.42 (L) 02/28/2020 0332   HGB 9.1 (L) 02/28/2020 0332   HGB 16.2 03/23/2018 1535   HCT 32.0 (L) 02/28/2020 0332   HCT 47.0 03/23/2018 1535   PLT 336 02/28/2020 0332   PLT 251 03/23/2018 1535   MCV 93.6 02/28/2020 0332   MCV 82 03/23/2018 1535   MCH 26.6 02/28/2020 0332   MCHC 28.4 (L) 02/28/2020 0332   RDW 20.0 (H) 02/28/2020 0332   RDW 15.0 03/23/2018 1535   LYMPHSABS 1.2 02/28/2020 0332   LYMPHSABS 2.5 03/23/2018 1535   MONOABS 1.0 02/28/2020 0332   EOSABS 0.1 02/28/2020 0332   EOSABS 0.2 03/23/2018 1535   BASOSABS 0.0 02/28/2020 0332   BASOSABS 0.0 03/23/2018 1535   BMET Recent Labs    02/27/20 1543 02/28/20 0331  NA 136 135  K 4.5 4.3  CL 102 102  CO2 26 26  GLUCOSE 154* 133*  BUN 46* 45*  CREATININE 0.95 0.90  CALCIUM 8.1* 7.9*   PT/INR No results for input(s): LABPROT, INR in the last 72 hours. ABG No results for input(s): PHART, HCO3  in the last 72 hours.  Invalid input(s): PCO2, PO2  Studies/Results: DG CHEST PORT 1 VIEW  Result Date: 02/28/2020 CLINICAL DATA:  Respiratory failure EXAM: PORTABLE CHEST 1 VIEW COMPARISON:  02/23/2020 FINDINGS: Interval removal of right-sided chest tube. Tracheostomy tube remains in place. Left-sided implanted cardiac device. Right-sided PICC line terminates within the SVC. Stable cardiomegaly. Stable pleural based density at the right lung apex. Lungs otherwise clear. No pneumothorax. IMPRESSION: Interval removal of right-sided chest tube. No pneumothorax. Electronically Signed   By: Davina Poke D.O.   On: 02/28/2020 07:56    Anti-infectives: Anti-infectives (From admission, onward)   Start     Dose/Rate Route Frequency Ordered Stop   02/20/20 2000  anidulafungin (ERAXIS) 100 mg in sodium chloride 0.9 % 100 mL IVPB        100 mg 78 mL/hr over 100 Minutes Intravenous Every 24 hours 02/19/20 1425 02/25/20 2125   02/19/20 1600  vancomycin (VANCOCIN) IVPB 1000 mg/200 mL premix        1,000 mg 200 mL/hr over 60 Minutes Intravenous Every 24 hours 02/19/20 1056 02/29/20 1559   02/19/20 1600  anidulafungin (ERAXIS) 200 mg in sodium chloride 0.9 % 200 mL IVPB        200 mg 78 mL/hr over 200 Minutes Intravenous  Once 02/19/20 1425 02/19/20 2045   02/19/20 1400  vancomycin (VANCOCIN) IVPB 1000 mg/200  mL premix  Status:  Discontinued        1,000 mg 200 mL/hr over 60 Minutes Intravenous Every 24 hours 02/20/2020 1312 02/11/2020 1633   02/19/20 1400  meropenem (MERREM) 1 g in sodium chloride 0.9 % 100 mL IVPB        1 g 200 mL/hr over 30 Minutes Intravenous Every 8 hours 02/19/20 1056 02/25/20 2211   02/12/2020 1800  meropenem (MERREM) 1 g in sodium chloride 0.9 % 100 mL IVPB  Status:  Discontinued        1 g 200 mL/hr over 30 Minutes Intravenous Every 12 hours 02/23/2020 1620 02/19/20 1056   02/20/2020 1400  ceFEPIme (MAXIPIME) 2 g in sodium chloride 0.9 % 100 mL IVPB  Status:  Discontinued         2 g 200 mL/hr over 30 Minutes Intravenous Every 12 hours 02/03/2020 1312 02/07/2020 1620   02/01/2020 1400  vancomycin (VANCOREADY) IVPB 2000 mg/400 mL        2,000 mg 200 mL/hr over 120 Minutes Intravenous  Once 02/01/2020 1312 02/03/2020 1827   02/23/2020 0859  vancomycin (VANCOCIN) 1,000 mg in sodium chloride 0.9 % 1,000 mL irrigation  Status:  Discontinued          As needed 02/04/2020 0859 02/11/2020 0927   02/10/2020 0800  levofloxacin (LEVAQUIN) IVPB 500 mg        500 mg 100 mL/hr over 60 Minutes Intravenous To Surgery 02/07/2020 0758 02/10/2020 0905   02/08/2020 1115  vancomycin (VANCOREADY) IVPB 1500 mg/300 mL  Status:  Discontinued        1,500 mg 150 mL/hr over 120 Minutes Intravenous NOW 02/20/2020 1103 01/31/2020 1245   01/29/2020 1400  vancomycin (VANCOCIN) IVPB 1000 mg/200 mL premix  Status:  Discontinued        1,000 mg 200 mL/hr over 60 Minutes Intravenous Every 24 hours 02/01/20 1011 02/09/20 0957   02/01/20 1400  meropenem (MERREM) 1 g in sodium chloride 0.9 % 100 mL IVPB  Status:  Discontinued        1 g 200 mL/hr over 30 Minutes Intravenous Every 8 hours 02/01/20 1011 02/09/20 0957   02/01/20 1100  vancomycin (VANCOREADY) IVPB 2000 mg/400 mL        2,000 mg 200 mL/hr over 120 Minutes Intravenous  Once 02/01/20 1011 02/01/20 1327   01/28/20 1300  vancomycin (VANCOCIN) IVPB 1000 mg/200 mL premix  Status:  Discontinued        1,000 mg 200 mL/hr over 60 Minutes Intravenous Every 24 hours 01/27/20 1430 01/28/20 0830   01/24/20 1300  vancomycin (VANCOCIN) IVPB 1000 mg/200 mL premix  Status:  Discontinued        1,000 mg 200 mL/hr over 60 Minutes Intravenous Every 24 hours 01/24/20 0846 01/27/20 1430   01/22/20 1601  vancomycin variable dose per unstable renal function (pharmacist dosing)  Status:  Discontinued         Does not apply See admin instructions 01/22/20 1601 01/24/20 0846   01/21/20 1200  vancomycin (VANCOREADY) IVPB 1250 mg/250 mL  Status:  Discontinued        1,250 mg 166.7 mL/hr over  90 Minutes Intravenous Every 24 hours 01/26/2020 1620 01/22/20 1457   01/12/2020 1800  meropenem (MERREM) 1 g in sodium chloride 0.9 % 100 mL IVPB  Status:  Discontinued        1 g 200 mL/hr over 30 Minutes Intravenous Every 8 hours 01/05/2020 1800 01/28/20 0830   01/26/2020 1348  vancomycin (  VANCOCIN) 1,000 mg in sodium chloride 0.9 % 1,000 mL irrigation  Status:  Discontinued          As needed 01/18/2020 1349 01/10/2020 1611   01/21/2020 1015  vancomycin (VANCOREADY) IVPB 1500 mg/300 mL        1,500 mg 150 mL/hr over 120 Minutes Intravenous On call to O.R. 01/28/2020 0927 12/30/2019 1556   01/19/20 1700  vancomycin (VANCOREADY) IVPB 1250 mg/250 mL  Status:  Discontinued        1,250 mg 166.7 mL/hr over 90 Minutes Intravenous Every 24 hours 01/18/20 1618 01/11/2020 1620   01/16/20 1700  vancomycin (VANCOREADY) IVPB 1500 mg/300 mL  Status:  Discontinued        1,500 mg 150 mL/hr over 120 Minutes Intravenous Every 24 hours 01/16/20 1121 01/18/20 1618   01/15/20 1700  vancomycin (VANCOREADY) IVPB 750 mg/150 mL  Status:  Discontinued        750 mg 150 mL/hr over 60 Minutes Intravenous Every 12 hours 01/15/20 1107 01/16/20 1121   01/25/2020 1535  vancomycin (VANCOCIN) powder  Status:  Discontinued          As needed 01/03/2020 1536 12/29/2019 1619   01/02/2020 1400  meropenem (MERREM) 1 g in sodium chloride 0.9 % 100 mL IVPB  Status:  Discontinued        1 g 200 mL/hr over 30 Minutes Intravenous Every 8 hours 01/07/2020 1007 01/10/2020 1800   01/02/2020 1400  levofloxacin (LEVAQUIN) IVPB 500 mg  Status:  Discontinued        500 mg 100 mL/hr over 60 Minutes Intravenous To Surgery 01/11/2020 1354 01/12/2020 1619   01/19/2020 1345  vancomycin (VANCOREADY) IVPB 1500 mg/300 mL  Status:  Discontinued        1,500 mg 150 mL/hr over 120 Minutes Intravenous To Surgery 12/29/2019 1340 01/24/2020 1619   01/03/2020 1345  cefUROXime (ZINACEF) 1.5 g in sodium chloride 0.9 % 100 mL IVPB  Status:  Discontinued        1.5 g 200 mL/hr over 30 Minutes  Intravenous To Surgery 01/12/2020 1340 01/18/2020 1353   01/22/2020 1345  cefUROXime (ZINACEF) 750 mg in sodium chloride 0.9 % 100 mL IVPB  Status:  Discontinued        750 mg 200 mL/hr over 30 Minutes Intravenous To Surgery 01/12/2020 1340 01/22/2020 1353   01/02/2020 1002  vancomycin (VANCOCIN) IVPB 1000 mg/200 mL premix  Status:  Discontinued        1,000 mg 200 mL/hr over 60 Minutes Intravenous Every 12 hours 01/16/2020 0948 01/15/20 1049   01/28/2020 1000  levofloxacin (LEVAQUIN) IVPB 750 mg        750 mg 100 mL/hr over 90 Minutes Intravenous Every 24 hours 01/11/2020 1512 01/20/2020 1900   01/02/2020 2130  vancomycin (VANCOCIN) IVPB 1000 mg/200 mL premix        1,000 mg 200 mL/hr over 60 Minutes Intravenous  Once 01/01/2020 1512 12/30/2019 1718   01/02/2020 1018  vancomycin (VANCOCIN) powder  Status:  Discontinued          As needed 01/19/2020 1019 01/27/2020 1511   01/24/2020 0400  vancomycin (VANCOREADY) IVPB 1500 mg/300 mL        1,500 mg 150 mL/hr over 120 Minutes Intravenous To Surgery 01/11/20 1357 01/26/2020 1100   01/22/2020 0400  levofloxacin (LEVAQUIN) IVPB 500 mg        500 mg 100 mL/hr over 60 Minutes Intravenous To Surgery 01/11/20 1357 01/23/2020 1000      Assessment/Plan:  s/p Procedure(s): Sternal wound debridement APPLICATION OF A-CELL Wound vac change  Plan to teturn to OR on Wednesday (02/29/2020) for Wound VAC change, application of dermal matrix, and additional debridement.    LOS: 60 days    Threasa Heads, [place] 02/28/2020

## 2020-02-28 NOTE — Progress Notes (Signed)
4 Days Post-Op Procedure(s) (LRB): Sternal wound debridement (N/A) APPLICATION OF A-CELL (N/A) Wound vac change (N/A) Subjective: sedated Objective: Vital signs in last 24 hours: Temp:  [97.2 F (36.2 C)-97.6 F (36.4 C)] 97.2 F (36.2 C) (01/31 0811) Pulse Rate:  [62-89] 82 (01/31 1700) Cardiac Rhythm: Normal sinus rhythm;Heart block (01/31 0800) Resp:  [9-27] 17 (01/31 1700) BP: (88-113)/(54-82) 102/65 (01/31 1700) SpO2:  [95 %-100 %] 100 % (01/31 1700) FiO2 (%):  [30 %] 30 % (01/31 1154) Weight:  [84.6 kg] 84.6 kg (01/31 0500)  Hemodynamic parameters for last 24 hours: CVP:  [8 mmHg] 8 mmHg  Intake/Output from previous day: 01/30 0701 - 01/31 0700 In: 3567.6 [I.V.:1697.5; NG/GT:1410; IV Piggyback:200.1] Out: 0737 [Drains:250; Stool:700] Intake/Output this shift: Total I/O In: 1302.1 [I.V.:737.1; Other:400; IV Piggyback:165] Out: 1062 [Other:1721]  General appearance: sedated Neurologic: unable to fully assess Heart: regular rate and rhythm, S1, S2 normal, no murmur, click, rub or gallop Lungs: clear to auscultation bilaterally Abdomen: soft, non-tender; bowel sounds normal; no masses,  no organomegaly Extremities: extremities normal, atraumatic, no cyanosis or edema Wound: dressed  Lab Results: Recent Labs    02/27/20 0436 02/28/20 0332  WBC 15.3* 12.6*  HGB 8.9* 9.1*  HCT 31.4* 32.0*  PLT 339 336   BMET:  Recent Labs    02/28/20 0331 02/28/20 1604  NA 135 134*  K 4.3 4.2  CL 102 102  CO2 26 26  GLUCOSE 133* 113*  BUN 45* 43*  CREATININE 0.90 0.84  CALCIUM 7.9* 8.0*    PT/INR: No results for input(s): LABPROT, INR in the last 72 hours. ABG    Component Value Date/Time   PHART 7.400 02/23/2020 0523   HCO3 25.7 02/23/2020 0523   TCO2 27 02/23/2020 0523   ACIDBASEDEF 1.0 02/21/2020 0625   O2SAT 56.6 02/28/2020 0342   CBG (last 3)  Recent Labs    02/28/20 0357 02/28/20 0808 02/28/20 1201  GLUCAP 119* 132* 164*    Assessment/Plan: S/P  Procedure(s) (LRB): Sternal wound debridement (N/A) APPLICATION OF A-CELL (N/A) Wound vac change (N/A) wean sedated  Vent wean   LOS: 60 days    Corey Palmer 02/28/2020

## 2020-02-28 NOTE — Plan of Care (Signed)
  Problem: Cardiac: Goal: Ability to achieve and maintain adequate cardiopulmonary perfusion will improve Outcome: Progressing   Problem: Education: Goal: Knowledge of General Education information will improve Description: Including pain rating scale, medication(s)/side effects and non-pharmacologic comfort measures Outcome: Progressing   Problem: Clinical Measurements: Goal: Ability to maintain clinical measurements within normal limits will improve Outcome: Progressing Goal: Diagnostic test results will improve Outcome: Progressing Goal: Respiratory complications will improve Outcome: Progressing Goal: Cardiovascular complication will be avoided Outcome: Progressing   Problem: Skin Integrity: Goal: Risk for impaired skin integrity will decrease Outcome: Progressing   Problem: Education: Goal: Ability to demonstrate management of disease process will improve Outcome: Progressing Goal: Ability to verbalize understanding of medication therapies will improve Outcome: Progressing   Problem: Cardiac: Goal: Ability to achieve and maintain adequate cardiopulmonary perfusion will improve Outcome: Progressing   Problem: Education: Goal: Ability to verbalize understanding of medication therapies will improve Outcome: Progressing   Problem: Cardiac: Goal: Ability to achieve and maintain adequate cardiopulmonary perfusion will improve Outcome: Progressing   Problem: Education: Goal: Will demonstrate proper wound care and an understanding of methods to prevent future damage Outcome: Progressing Goal: Knowledge of disease or condition will improve Outcome: Progressing Goal: Knowledge of the prescribed therapeutic regimen will improve Outcome: Progressing Goal: Individualized Educational Video(s) Outcome: Progressing   Problem: Cardiac: Goal: Will achieve and/or maintain hemodynamic stability Outcome: Progressing   Problem: Clinical Measurements: Goal: Postoperative  complications will be avoided or minimized Outcome: Progressing   Problem: Respiratory: Goal: Respiratory status will improve Outcome: Progressing   Problem: Skin Integrity: Goal: Wound healing without signs and symptoms of infection Outcome: Progressing Goal: Risk for impaired skin integrity will decrease Outcome: Progressing

## 2020-02-29 DIAGNOSIS — Z9889 Other specified postprocedural states: Secondary | ICD-10-CM

## 2020-02-29 DIAGNOSIS — R570 Cardiogenic shock: Secondary | ICD-10-CM | POA: Diagnosis not present

## 2020-02-29 DIAGNOSIS — J9601 Acute respiratory failure with hypoxia: Secondary | ICD-10-CM | POA: Diagnosis not present

## 2020-02-29 DIAGNOSIS — Z9911 Dependence on respirator [ventilator] status: Secondary | ICD-10-CM

## 2020-02-29 LAB — RENAL FUNCTION PANEL
Albumin: 2.1 g/dL — ABNORMAL LOW (ref 3.5–5.0)
Albumin: 2.1 g/dL — ABNORMAL LOW (ref 3.5–5.0)
Anion gap: 8 (ref 5–15)
Anion gap: 9 (ref 5–15)
BUN: 44 mg/dL — ABNORMAL HIGH (ref 6–20)
BUN: 45 mg/dL — ABNORMAL HIGH (ref 6–20)
CO2: 25 mmol/L (ref 22–32)
CO2: 24 mmol/L (ref 22–32)
Calcium: 8.1 mg/dL — ABNORMAL LOW (ref 8.9–10.3)
Calcium: 8.2 mg/dL — ABNORMAL LOW (ref 8.9–10.3)
Chloride: 99 mmol/L (ref 98–111)
Chloride: 98 mmol/L (ref 98–111)
Creatinine, Ser: 0.92 mg/dL (ref 0.61–1.24)
Creatinine, Ser: 0.86 mg/dL (ref 0.61–1.24)
GFR, Estimated: >60 mL/min (ref >60)
GFR, Estimated: >60 mL/min (ref >60)
Glucose, Bld: 139 mg/dL — ABNORMAL HIGH (ref 70–99)
Glucose, Bld: 204 mg/dL — ABNORMAL HIGH (ref 70–99)
Phosphorus: 1.5 mg/dL — ABNORMAL LOW (ref 2.5–4.6)
Phosphorus: 3.8 mg/dL (ref 2.5–4.6)
Potassium: 4.4 mmol/L (ref 3.5–5.1)
Potassium: 5.1 mmol/L (ref 3.5–5.1)
Sodium: 132 mmol/L — ABNORMAL LOW (ref 135–145)
Sodium: 131 mmol/L — ABNORMAL LOW (ref 135–145)

## 2020-02-29 LAB — CBC WITH DIFFERENTIAL/PLATELET
Abs Immature Granulocytes: 0.13 10*3/uL — ABNORMAL HIGH (ref 0.00–0.07)
Basophils Absolute: 0 10*3/uL (ref 0.0–0.1)
Basophils Relative: 0 %
Eosinophils Absolute: 0.2 10*3/uL (ref 0.0–0.5)
Eosinophils Relative: 1 %
HCT: 33.8 % — ABNORMAL LOW (ref 39.0–52.0)
Hemoglobin: 9.7 g/dL — ABNORMAL LOW (ref 13.0–17.0)
Immature Granulocytes: 1 %
Lymphocytes Relative: 6 %
Lymphs Abs: 1.1 10*3/uL (ref 0.7–4.0)
MCH: 26.5 pg (ref 26.0–34.0)
MCHC: 28.7 g/dL — ABNORMAL LOW (ref 30.0–36.0)
MCV: 92.3 fL (ref 80.0–100.0)
Monocytes Absolute: 1.3 10*3/uL — ABNORMAL HIGH (ref 0.1–1.0)
Monocytes Relative: 8 %
Neutro Abs: 14.3 10*3/uL — ABNORMAL HIGH (ref 1.7–7.7)
Neutrophils Relative %: 84 %
Platelets: 394 10*3/uL (ref 150–400)
RBC: 3.66 MIL/uL — ABNORMAL LOW (ref 4.22–5.81)
RDW: 19.9 % — ABNORMAL HIGH (ref 11.5–15.5)
WBC: 17 10*3/uL — ABNORMAL HIGH (ref 4.0–10.5)
nRBC: 0 % (ref 0.0–0.2)

## 2020-02-29 LAB — COOXEMETRY PANEL
Carboxyhemoglobin: 2 % — ABNORMAL HIGH (ref 0.5–1.5)
Methemoglobin: 0.8 % (ref 0.0–1.5)
O2 Saturation: 55.4 %
Total hemoglobin: 10.1 g/dL — ABNORMAL LOW (ref 12.0–16.0)

## 2020-02-29 LAB — GLUCOSE, CAPILLARY
Glucose-Capillary: 142 mg/dL — ABNORMAL HIGH (ref 70–99)
Glucose-Capillary: 127 mg/dL — ABNORMAL HIGH (ref 70–99)
Glucose-Capillary: 201 mg/dL — ABNORMAL HIGH (ref 70–99)
Glucose-Capillary: 187 mg/dL — ABNORMAL HIGH (ref 70–99)
Glucose-Capillary: 129 mg/dL — ABNORMAL HIGH (ref 70–99)

## 2020-02-29 LAB — MAGNESIUM: Magnesium: 2.4 mg/dL (ref 1.7–2.4)

## 2020-02-29 LAB — HEPARIN LEVEL (UNFRACTIONATED): Heparin Unfractionated: 0.34 IU/mL (ref 0.30–0.70)

## 2020-02-29 MED ORDER — CHLORHEXIDINE GLUCONATE CLOTH 2 % EX PADS
6.0000 | MEDICATED_PAD | Freq: Once | CUTANEOUS | Status: AC
  Administered 2020-03-01: 6 via TOPICAL

## 2020-02-29 MED ORDER — LORAZEPAM 2 MG/ML IJ SOLN
0.5000 mg | INTRAMUSCULAR | Status: DC
  Administered 2020-02-29 – 2020-03-02 (×8): 0.5 mg via INTRAVENOUS
  Filled 2020-02-29 (×9): qty 1

## 2020-02-29 MED ORDER — CHLORHEXIDINE GLUCONATE CLOTH 2 % EX PADS
6.0000 | MEDICATED_PAD | Freq: Once | CUTANEOUS | Status: AC
  Administered 2020-02-29: 6 via TOPICAL

## 2020-02-29 MED ORDER — DOBUTAMINE IN D5W 4-5 MG/ML-% IV SOLN
3.0000 ug/kg/min | INTRAVENOUS | Status: DC
  Administered 2020-02-29 – 2020-03-01 (×2): 4 ug/kg/min via INTRAVENOUS
  Administered 2020-03-03: 3 ug/kg/min via INTRAVENOUS
  Administered 2020-03-06: 4 ug/kg/min via INTRAVENOUS
  Filled 2020-02-29 (×4): qty 250

## 2020-02-29 MED ORDER — DEXMEDETOMIDINE HCL IN NACL 400 MCG/100ML IV SOLN
0.4000 ug/kg/h | INTRAVENOUS | Status: DC
  Administered 2020-02-29: 1 ug/kg/h via INTRAVENOUS
  Administered 2020-03-01: 0.7 ug/kg/h via INTRAVENOUS
  Administered 2020-03-01: 1.2 ug/kg/h via INTRAVENOUS
  Administered 2020-03-01: 1 ug/kg/h via INTRAVENOUS
  Administered 2020-03-01 (×3): 0.8 ug/kg/h via INTRAVENOUS
  Administered 2020-03-02 (×2): 1.2 ug/kg/h via INTRAVENOUS
  Administered 2020-03-02: 1 ug/kg/h via INTRAVENOUS
  Administered 2020-03-02: 0.7 ug/kg/h via INTRAVENOUS
  Administered 2020-03-02: 1 ug/kg/h via INTRAVENOUS
  Administered 2020-03-03: 0.701 ug/kg/h via INTRAVENOUS
  Administered 2020-03-03: 0.6 ug/kg/h via INTRAVENOUS
  Administered 2020-03-03: 0.7 ug/kg/h via INTRAVENOUS
  Administered 2020-03-04: 0.4 ug/kg/h via INTRAVENOUS
  Administered 2020-03-05 (×2): 0.3 ug/kg/h via INTRAVENOUS
  Filled 2020-02-29 (×6): qty 100
  Filled 2020-02-29: qty 200
  Filled 2020-02-29 (×2): qty 100
  Filled 2020-02-29: qty 200
  Filled 2020-02-29 (×4): qty 100
  Filled 2020-02-29: qty 200

## 2020-02-29 MED ORDER — POTASSIUM PHOSPHATES 15 MMOLE/5ML IV SOLN
20.0000 mmol | Freq: Once | INTRAVENOUS | Status: AC
  Administered 2020-02-29: 20 mmol via INTRAVENOUS
  Filled 2020-02-29: qty 6.67

## 2020-02-29 MED ORDER — CLONAZEPAM 0.5 MG PO TBDP
1.0000 mg | ORAL_TABLET | Freq: Every day | ORAL | Status: DC
  Administered 2020-02-29 – 2020-03-02 (×3): 1 mg via ORAL
  Filled 2020-02-29 (×3): qty 2

## 2020-02-29 NOTE — Progress Notes (Signed)
NAME:  Corey Palmer, MRN:  [id number], DOB:  19-Sep-1970, LOS: 49 ADMISSION DATE:  01/13/2020, CONSULTATION DATE: 01/27/2020 REFERRING MD: Aundra Dubin CHIEF COMPLAINT: Respiratory failure post Impella  HPI/Course in hospital  50 year old man admitted 12/1 with A. fib/RVR, torsades, cardiogenic shock [EF 15] requiring Impella placement (removed 1/21), CABG x 4 c/b cardiac tamponade requiring chest reopening (now with VAC), AKI requiring on CRRT.  Past Medical History:    has a past medical history of Anginal pain ([place]), Anxiety, Arthritis, Automatic implantable cardioverter-defibrillator in situ (2007), Bipolar disorder ([place]), CAD (coronary artery disease), Cancer ([place]) (2013), CHF (congestive heart failure) ([place]), Chondrocalcinosis of right knee (07/17/5091), Chronic systolic heart failure ([place]), Depression, Diabetes uncomplicated adult-type II, Dilated cardiomyopathy ([place]), Dyslipidemia, Dysrhythmia, GERD (gastroesophageal reflux disease), Gout, unspecified, Hepatic steatosis (2011), History of kidney stones, echocardiogram, Hypertension, Obesity (BMI 30.0-34.9), Pacemaker, Paroxysmal atrial fibrillation ([place]), Peptic ulcer, Shortness of breath, and Sleep apnea.  Significant Hospital Events:  12/2 Afib with RVR. Cardioverted in ED. AICD did not fire. Milrinone for low co ox.  12/7 ICD fired, Torsades- likely from low K of 2.8.  12/8 Cath- multivessel disease w/ low CO. Volume overloaded.  12/11 Underwent Impella 5.5 insertion for persistent symptoms of forward failure with low cardiac indices. 12/12 Back to the OR for displacement of Impella. Extubated.  12/15 CABG x4, with LIMA-LAD, SVG-PDA/PLV, SVG-OM 12/16 Emergently opened chest at bedside for tamponade, clot compressing right atrium  12/16 To OR for Exploration of chest due to bleeding  12/19 Started CRRT 12/23 Sternal Closure in OR 12/24 Worsening shock.  Chest reopened at bedside with improvement in hemodynamics.  No mediastinal  hematoma. 12/25 A flutter > rapid a paced to sinus rhythm.  Remains on high-dose pressors, inotropes 12/28 Weaning vasopressor requirements.  For chest washout today.  Back in atrial fibrillation, refractory to increased amiodarone and attempted overdrive pacing.  DCCV to NSR. Tolerating fluid removal via CRRT. 1/03 Appears euvolemic.  Adjusting glycemic control.  Chest still open so not weaning 1/04 Spiking fever, Vanco meropenem resumed after culture sent from blood and sputum. 1/05 Awaiting return to the OR for wound VAC dressing assessment fever curve and white blood cell count improving after antibiotics resumed the day prior, did have chest closed, developed tamponade physiology shortly after had to go back for emergent reopening of chest, return to intensive care and refractory shock 1/06 Wound VAC dressing replaced.  Still in shock.  Starting to wean sedation 1/07 Still on high-dose pressors, CRRT 1/15 Recurrent Vtach, shocked 1/18 Attempted pressure change to P 3 with addition of Dobutamine . MAP goal > 60 1/19 Ongoing attempts at impella wean, on PS 1/20 Stable on vent, following commands with sedation weaned, continued Impella wean attempt 1/21 Impella removed, on FloTrac, WBC 22K, pancultured, Cefepime/Vanc started empirically for low SVR, concern for sepsis 1/22 antifungals added Impella removed on 1/21, 3 PM CT x3 in place 1/27 OR with plastics for debridement, ACell, partial closure 1/28 plan to stop CRRT when clots off 1/29 CRRT didn't clot off, continues on 1/31 CRRT ongoing, tolerated PSV 16/8 for several hours.    Consults:  PCCM, nephrology, cardiology, cardiothoracic surgery, EP  Procedures:  R PICC 12/2 >> R axillary Impella 5.5 12/11 >> 1/21 ETT 12/15 >> 1/10 R femoral arterial sheath 12/15 >> 12/31 R femoral HD cath 12/19 >> 12/31 Chest tube - right pleural, left pleural , mediastinal 12/23 >> 1/5 R Radial A-Line 1/6 >> R fem HD > Chest tube - L pleural,  R  pleural, mediastinal 1/10 >> 1/30 Trach 1/10 > PEG 1/10 >  Significant Diagnostic Tests:  12/8 LHC  Elevated R and L heart filling pressures with low CO, severe 3-vessel disease 1/16 echo > Severe biventricular failure. No significant effusion. Pacing wire in  RV. Markedly abnormal septal motion. Micro Data:  1/19 BCx2 -no growth at 3 days 1/21 OR Cx > staph epi  Antimicrobials:  Levaquin 1/21 (periop ppx) Meropenem 1/21>1/29 Vanc 1/21 >  Anidulafungin 1/22>  1/29  Interval history:  PSV 16/8 for several hours yesterday.   Objective   Blood pressure (!) 93/58, pulse 94, temperature (!) 97.2 F (36.2 C), temperature source Axillary, resp. rate (!) 24, height 6\' 3"  (1.905 m), weight 84.6 kg, SpO2 100 %.    Vent Mode: PRVC FiO2 (%):  [30 %] 30 % Set Rate:  [24 bmp] 24 bmp Vt Set:  [510 mL] 510 mL PEEP:  [5 cmH20] 5 cmH20 Pressure Support:  [16 cmH20] 16 cmH20 Plateau Pressure:  [18 cmH20-25 cmH20] 18 cmH20   Intake/Output Summary (Last 24 hours) at 02/29/2020 0723 Last data filed at 02/29/2020 0700 Gross per 24 hour  Intake 3293.47 ml  Output 5889 ml  Net -2595.53 ml   Filed Weights   02/26/20 0500 02/27/20 0500 02/28/20 0500  Weight: 88.7 kg 87.5 kg 84.6 kg       Examination:   General: chronically and critically ill appearing middle aged male on vent.  HEENT: [place]/AT, PERRL, Trach in place. No appreciable JVD Neuro: somnolent. Does arouse and loosely follows commands.  CV: RRR no MRG midline sternal wound with vac.   PULM: Rhonchi clear with suctioning. No distress. Synchronous.  GI: Soft, non-tender, non-distended Extremities: No acute deformity. No edema.  skin: dry, no rash.   Assessment & Plan:   Acute hypoxic respiratory failure requiring MV, s/p trach 1/10 Possible HCAP s/p tx with meropenem/eraxis Small R pleural effusion, persistent  P - Continue MV. PSV trials as tolerated. Tolerated 16/5 yesterday. Plan to decrease pressure support slowly.  -  Sedation/pain management balance is difficult. Dilaudid IV stopped yesterday. Agitated this morning - Continue IV precedex, PO dilaudid, Scheduled ativan, PRN versed.  - After OR tomorrow will plan to be more aggressive with sedation wean, but may need to temporarily add back better pain control post op.  - RASS goal ultimately 0 to -1.  - VAP, PAD - Pulm hygiene  Cardiogenic shock -s/p impella, removed 1/21 -s/p CABG c/b tamponade and multiple chest re-openings, prolonged open chest  -s/p excision/debridement + ACell application, partial closure with plastics 1/27 P -per CVTS/HF -dobutamine, midodrine, trend coox  -trend CBC, fever curve   MRSE sternal wound infection - vanco ongoing - For surgical wound vac change tomorrow.   Afib RVR, recurrent VT, Torsades -- improved  P - ICU monitoring - Follow elextrolytes -amio, mexiletine  Acute renal failure in CKD IIIa requiring RRT Cardiorenal syndrome  P -nephro following -plan to continue CRRT through next OR trip (tentatively Wed) so as to not get behind from a volume status. Next step would be to add long acting enteral benzo and stop ativan pushes.  -in future, plan to trial iHD. If unable to tolerate iHD will need to re-consider GOC.  Hyponatremia -CRRT, nephro  Anemia - trend H/H  DM with hyperglycemia - SSI + basal   Deconditioning Dysphagia At risk malnutrition  - Dietician following - PT/OT - fiber, lomotil   Daily Goals Checklist  Pain/Anxiety/Delirium protocol (if indicated):  Precedex, weaning as able VAP protocol (if indicated): yes DVT prophylaxis:  Heparin gtt Nutrition Status: TF GI prophylaxis: Pantoprazole Glucose control: Insulin (Basal, Standing, SSI) Mobility/therapy needs: per primary Code Status: Full  Family Communication: per primary-- palliative also updating  Disposition: ICU  Critical care time 35 mins  Georgann Housekeeper, AGACNP-BC [place] for  personal pager PCCM on call pager [phone] until 7pm. Please call Elink 7p-7a. [phone]  02/29/2020 7:23 AM

## 2020-02-29 NOTE — Progress Notes (Addendum)
Patient ID: Corey Palmer, male   DOB: 06-14-1970, 50 y.o.   MRN: [id number]     Advanced Heart Failure Rounding Note  PCP-Cardiologist: No primary care provider on file.   Subjective:    - 12/7 Torsades -ICD shock x1.  - 12/11 Impella 5.5 placed.  - 12/12 Impella repositioned in OR - 12/15 CABG x 4 with LIMA-LAD, SVG-PDA/PLV, SVG-OM - 12/16 DCCV afib.  Back to OR to re-open chest and again to evacuate hematoma.  - 12/19 CVVH initiated  - 12/23 Chest closed - 12/24 Chest reopened - 12/26 RAP back into NSR - 12/27 back in atrial fibrillation, unable to rapid atrial pace out.   - 12/28 To OR, chest partially closed and wound vac placed.  DCCV to NSR.  - 12/31 Antibiotics stopped. Swan removed, HD catheter moved from right femoral to right IJ.  - 1/5 To OR for chest closure. Chest reopened emergently at bedside again to evacuate hematoma  - 1/10 To OR: omental flap closure of chest with wound vac, tracheostomy, G-tube, J-tube.    - 1/12 Plastics and EP consulted.  - 1/13 Palliative Care consulted.  - 1/15 Went into VT, Shock 200 J and amiodarone bolus 150.   - 1/16: Milrinone stopped.    - 1/17: Partial closure of chest in OR - 1/18 Switched to dobutamine 4 mcg - 1/21 Impella Extracted.  Developed hyperkalemia when CVVH held with wide QRS rhythm and required transient external pacing.   - 1/27 OR for wound vac change, partial chest wall closure.   Co-ox 55% on dobutamine 5, off pressors. CVP4 Remains on CVVHD. On vent with trach.  Yesterday, tolerated PSV for numerous hours.   Wakes to verbal stimuli, able to follow commands.    Objective:   Weight Range: 84.6 kg Body mass index is 23.31 kg/m.   Vital Signs:   Temp:  [97.2 F (36.2 C)] 97.2 F (36.2 C) (01/31 0811) Pulse Rate:  [62-94] 79 (01/31 2200) Resp:  [9-27] 16 (01/31 2200) BP: (87-113)/(54-82) 87/60 (01/31 2200) SpO2:  [97 %-100 %] 97 % (02/01 0025) FiO2 (%):  [30 %] 30 % (02/01 0025) Weight:  [84.6 kg] 84.6 kg  (01/31 0500) Last BM Date: 02/28/20  Weight change: Filed Weights   02/26/20 0500 02/27/20 0500 02/28/20 0500  Weight: 88.7 kg 87.5 kg 84.6 kg    Intake/Output:   Intake/Output Summary (Last 24 hours) at 02/29/2020 0045 Last data filed at 02/29/2020 0000 Gross per 24 hour  Intake 3243.01 ml  Output 5311 ml  Net -2067.99 ml      Physical Exam   CVP 4 General:  Chronically ill appearing.  HEENT: Trach, dry MM.  Neck: supple. No JVD.  Cor: PMI nondisplaced. Regular rate & rhythm. No rubs, gallops or murmurs. Lungs: Coarse breathing sounds B/L.  Abdomen: soft, nontender, nondistended. Good bowel sounds. Extremities: no cyanosis, clubbing, rash, edema.  Neuro: alert,  moves all 4 extremities w/o difficulty.   Telemetry   SR 80s.  Personally reviewed.   Labs    CBC Recent Labs    02/27/20 0436 02/28/20 0332  WBC 15.3* 12.6*  NEUTROABS 12.6* 10.2*  HGB 8.9* 9.1*  HCT 31.4* 32.0*  MCV 93.2 93.6  PLT 339 858   Basic Metabolic Panel Recent Labs    02/27/20 0436 02/27/20 1543 02/28/20 0331 02/28/20 1604  NA 134*   < > 135 134*  K 4.0   < > 4.3 4.2  CL 101   < >  102 102  CO2 26   < > 26 26  GLUCOSE 156*   < > 133* 113*  BUN 43*   < > 45* 43*  CREATININE 0.94   < > 0.90 0.84  CALCIUM 8.0*   < > 7.9* 8.0*  MG 2.5*  --  2.4  --   PHOS 1.9*   < > 2.0* 1.8*   < > = values in this interval not displayed.   Liver Function Tests Recent Labs    02/28/20 0331 02/28/20 1604  ALBUMIN 1.9* 2.0*   No results for input(s): LIPASE, AMYLASE in the last 72 hours. Cardiac Enzymes No results for input(s): CKTOTAL, CKMB, CKMBINDEX, TROPONINI in the last 72 hours.  BNP: BNP (last 3 results) Recent Labs    01/15/2020 1619  BNP 577.5*    ProBNP (last 3 results) No results for input(s): PROBNP in the last 8760 hours.   D-Dimer No results for input(s): DDIMER in the last 72 hours. Hemoglobin A1C No results for input(s): HGBA1C in the last 72 hours. Fasting Lipid  Panel No results for input(s): CHOL, HDL, LDLCALC, TRIG, CHOLHDL, LDLDIRECT in the last 72 hours. Thyroid Function Tests No results for input(s): TSH, T4TOTAL, T3FREE, THYROIDAB in the last 72 hours.  Invalid input(s): FREET3  Other results:   Imaging    DG CHEST PORT 1 VIEW  Result Date: 02/28/2020 CLINICAL DATA:  Respiratory failure EXAM: PORTABLE CHEST 1 VIEW COMPARISON:  02/23/2020 FINDINGS: Interval removal of right-sided chest tube. Tracheostomy tube remains in place. Left-sided implanted cardiac device. Right-sided PICC line terminates within the SVC. Stable cardiomegaly. Stable pleural based density at the right lung apex. Lungs otherwise clear. No pneumothorax. IMPRESSION: Interval removal of right-sided chest tube. No pneumothorax. Electronically Signed   By: Davina Poke D.O.   On: 02/28/2020 07:56     Medications:     Scheduled Medications: . sodium chloride   Intravenous Once  . amiodarone  200 mg Per Tube BID  . vitamin C  500 mg Per Tube TID  . aspirin  324 mg Per Tube Daily  . atorvastatin  80 mg Per Tube Daily  . busPIRone  10 mg Per Tube TID  . chlorhexidine gluconate (MEDLINE KIT)  15 mL Mouth Rinse BID  . Chlorhexidine Gluconate Cloth  6 each Topical Daily  . darbepoetin (ARANESP) injection - NON-DIALYSIS  100 mcg Subcutaneous Q Sat-1800  . feeding supplement (PROSource TF)  45 mL Per Tube TID  . fiber  1 packet Per Tube BID  . FLUoxetine  40 mg Per Tube BID  . HYDROmorphone  4 mg Per Tube Q6H  . insulin aspart  0-9 Units Subcutaneous Q4H  . insulin aspart  4 Units Subcutaneous Q4H  . insulin detemir  12 Units Subcutaneous Q12H  . LORazepam  1 mg Intravenous Q4H  . mouth rinse  15 mL Mouth Rinse 10 times per day  . melatonin  5 mg Per Tube Daily  . mexiletine  150 mg Per Tube Q8H  . midodrine  20 mg Per Tube TID WC  . multivitamin  1 tablet Per Tube QHS  . nutrition supplement (JUVEN)  1 packet Per Tube BID BM  . pantoprazole (PROTONIX) IV  40 mg  Intravenous Q12H  . sodium chloride flush  10-40 mL Intracatheter Q12H  . thiamine  100 mg Per Tube Daily  . valproic acid  250 mg Per Tube BID    Infusions: .  prismasol BGK 4/2.5 500 mL/hr at  02/28/20 2134  .  prismasol BGK 4/2.5 200 mL/hr at 02/28/20 2135  . sodium chloride 10 mL/hr at 02/29/20 0000  . sodium chloride    . dexmedetomidine (PRECEDEX) IV infusion 0.7 mcg/kg/hr (02/29/20 0016)  . dextrose Stopped (02/08/2020 1846)  . DOBUTamine 5 mcg/kg/min (02/29/20 0000)  . feeding supplement (PIVOT 1.5 CAL) 1,000 mL (02/28/20 1550)  . heparin 1,750 Units/hr (02/29/20 0000)  . HYDROmorphone Stopped (02/28/20 1832)  . lactated ringers 20 mL/hr at 02/29/20 0000  . prismasol BGK 4/2.5 1,500 mL/hr at 02/29/20 0044    PRN Medications: sodium chloride, Place/Maintain arterial line **AND** sodium chloride, artificial tears, bisacodyl **OR** bisacodyl, dextrose, docusate, heparin, heparin, HYDROmorphone, lip balm, loperamide HCl, metoprolol tartrate, midazolam, nystatin, polyethylene glycol, sodium chloride flush     Assessment/Plan   1. Cardiogenic shock/acute on chronic systolic CHF: Initially nonischemic cardiomyopathy.  Possible familial cardiomyopathy as both parents had cardiomyopathy and died at around 31. However, [employer] gene testing did not show any common mutation for cardiomyopathy.  However, this admission noted to have severe 3 vessel disease so suspect component of ischemic cardiomyopathy.  St Jude ICD.  Echo in 8/20 with EF 15% and mildly decreased RV function. Echo this admission with EF < 20%, moderate LV dilation, RV mildly reduced, severe LAE, no significant MR. Low output HF with markedly low EF.  He has a long history of cardiomyopathy (20 yrs), tends to minimize symptoms.  Cardiorenal syndrome with creatinine up to 2.2,  stabilized on milrinone and Impella 5.5. SCr 1.44 day of CABG. CABG 12/15.  Post-op shock, back to OR 12/16 to open chest (chest wall compartment  syndrome) and again to evacuate hematoma.  TEE post-op with severe RV dysfunction.  CVVHD initiated 12/19 for volume removal in setting of rising Scr/decreased UOP. Suspect ATN. S/p chest closure 12/23. Hemodynamics much worse on 12/24 with rising pressor demands. Co-ox 36%.  Bedside echo severe biventricular dysfunction with marked septal bounce. Chest re-opened 12/24.  Was atrial paced back to NSR on 12/26 with improvement in hemodynamics but back in atrial fibrillation on 12/27 and unable to pace out, DCCV to NSR on 12/28. Went back to OR 1/5 for chest closure but several hours later required emergent re-opening of chest at bedside to evacuate hematoma. To OR again 1/10 with closure of chest with omental flap with G & J tube.  1/17 partial closure. 1/21 Impella removed.  Off CVVH post-removal, he developed hyperkalemia and wide QRS rhythm requiring transient external pacing, also with suspected septic shock (low SVR, fever, rising WBCs).   - Off pressors, on dobutamine gtt 5, co-ox 55%.  CVP 4 on CVVH pulling about 50/H.  - Continue dobutamine 5. Unable to wean.  - Continues on midodrine 20 TID.  - Nephro unsure if he would be able to tolerate iHD.  - Keeping CRRT on to optimize for chest closure - Not candidate to replace Impella (at this point, not candidate for LVAD or transplant).  - Dr. Aundra Dubin has discussed with his wife, will need to show that he can tolerate iHD to be candidate. If he cannot, will need to start thinking about comfort care.  2. Atrial fibrillation: H/o PAF.  He was on dofetilide in the remote past but this was stopped due to noncompliance.  He had an upper GI bleed from antral ulcers in 3/12. He was seen by GI and was deemed safe to restart anticoagulation as long as he remains on a PPI.  No apparent recurrence of AF  until just prior to this admission, was cardioverted back to NSR in ER but went back into atrial fibrillation.  Post-op afib, DCCV to NSR/BiV pacing am 12/16 -> back to  AF. Rapid atrial pacing back to SR. Remains in NSR - Continue amiodarone per tube 200 mg bid.  - Continue heparin gtt.   3. CAD:   Cath this admission with severe 3 vessel disease. CABG x 4 on 12/15. No s/s angina.  - Continue atorvastatin, ASA.  4. Acute on chronic hypoxemic respiratory failure: Vent per CCM.  Has tracheostomy.  - CCM following, will need to work on slowly weaning vent.  5. AKI on CKD stage 3: Likely ATN/cardiorenal. Anuric. On CVVH.    - Nephro recommendations appreciated. Remains on CVVHD. 6. Diabetes: Insulin.  7. Hyponatremia: Resolved with CVVH.  8. Torsades/ICD shock: 12/7/212 in hospital event, in setting of severe hypokalemia and hypomagnesemia.   9. Gout: h/o gout. Complained of rt knee pain c/w previous flares - treated w/ prednisone burst (completed).  10: ID: Septic shock 1/21 with fever, low SVR, and elevated WBCs. Cultures no growth with exception of axillary cx on 1/21 grew staph epidermidis.  IJ catheter removed. Completed hydrocortisone x 4 days.   - On vancomycin.  Completed anidulafungin & Meropenem on 1/28.  WBC are trending up but afebrile. Continue to monitor for now.  11. FEN: c/w TFs.  12. Anemia: Hgb stable at 9.7  today.  Transfuse < 7.5.   13. Stage II Pressure Ulcer- sacrum: Continue to reposition every 2 hours R/L  14. Pleural Effusion: Loculated rt apical pleural effusion noted on imaging. Continue UF through CVVH.  PCCM following  15. VT: Amiodarone + mexiletine.   16. Deconditioning: Profound. Work with PT, nursing to work on range of motion.   Carlene Coria 02/29/2020 12:45 AM   Patient seen with NP, agree with the above note.   Stable today, remains on CVVH pulling net 50 cc/hr generally, I/Os negative with weight down.  CVP 4-5.  Co-ox 55%.  He has completed antibiotics.  Remains on dobutamine 5.  PSV trial yesterday.   General: NAD, sedated on vent Neck: No JVD, no thyromegaly or thyroid nodule.  Lungs: decreased at bases.  CV:  Nondisplaced PMI.  Heart regular S1/S2, no S3/S4, no murmur.  No peripheral edema.   Abdomen: Soft, nontender, no hepatosplenomegaly, no distention.  Skin: Intact without lesions or rashes.  Neurologic: Will awaken and follow commands. Extremities: No clubbing or cyanosis.  HEENT: Normal.   Plan to continue CVVH until surgeries completed, has surgery on chest wall again on Wednesday.  After that, will need to try to convert to [facility].  Not sure how well he will tolerate.  CVP 4-5, aim to run CVVH net even today.    Remains on dobutamine 5, co-ox 55%. Will try slow wean of dobutamine, decrease to 4 today.     Remains in NSR on amiodarone and heparin gtt.  Continue heparin gtt until he has completed surgeries.   Continue PSV trials, probably too weak for trach collar at this point.   Profoundly deconditioned, continue range of motion etc.   Have asked social work to look into LTAC options.  Will have trach, HD, and dobutamine.   CRITICAL CARE Performed by: Loralie Champagne  Total critical care time: 35 minutes  Critical care time was exclusive of separately billable procedures and treating other patients.  Critical care was necessary to treat or prevent imminent or life-threatening deterioration.  Critical care was time spent personally by me on the following activities: development of treatment plan with patient and/or surrogate as well as nursing, discussions with consultants, evaluation of patient's response to treatment, examination of patient, obtaining history from patient or surrogate, ordering and performing treatments and interventions, ordering and review of laboratory studies, ordering and review of radiographic studies, pulse oximetry and re-evaluation of patient's condition.   Loralie Champagne 02/29/2020 7:57 AM

## 2020-02-29 NOTE — Progress Notes (Signed)
[facility] NEPHROLOGY PROGRESS NOTE  Assessment/ Plan: Pt is a 50 y.o. yo male  with familial cardiomyopathy EF20% s/p redo CABG 02/05/30 complicated by persistent shock, multiple attempts at sternal closure with formation of chest hematoma and tamponade, Afib with RVR, and AKI requiring CRRT.    # Anuric AKI/CKD stage III- presumably due to ischemic ATN in setting of cardiogenic shock s/p CABG.  Likely ESRD at this point.  CRRT started 01/16/20.  All fluids 4K/2.5Ca.  RIJ on 01/28/20. Continue CRRT, no prescription change, UF is net negative as tolerated- cont this rate.  Hyperkalemia improved.  The line was changed to right temp femoral catheter- has been in for 10 days.  He is on inotropic support currently.  At some point need to see if he will tolerate iHD but in light of still open chest we will continue CRRT for now so we don't get too far behind with I/Os -- his intake is typically ~4L/day so would need to tolerate fairly robust UF with HD which I'm not sure he will .    # CAD s/p CABG complicated by cardiogenic shock and post-op hematoma. secondary closure on 12/23 then had to be re opened. Went to the OR for washout on 12/28, bedside washout 02/01/19.  OR 02/03/2020 for closure, then had emergent reopening at the bedside 02/25/2020 d/t anterior chest hematoma again. S/p OR plastic surgery 1/27 - excision of wound, 2cm closure.   # Cardiogenic shock- underlying familial cardiomyopathy s/p redo CABG, EF ~20%. Remains on inotrope,  Midodrine 20 tid.  The Impella was removed on 1/21.  # ABLA- s/p postoperative bleeding into chest cavity s/p re-exploration for tamponade/hematoma. Received multiple blood products. On ESA- no heparin  # Atrial fibrillation - s/p DCCV on 12/16. On amiodarone.   # VDRF- per CHF/PCCMsvc. Trach/peg 1/10  [place]lytes-  Phos 2 but cannot replete with sodium phos-  K is OK on 4 k bath-  Will give sodium phos    Subjective: Seen and examined in ICU.     Running CRRT.  On vent.  Remains critically ill without any new event.  I/Os 3.3 / 5.8.  Discussed with RN-   Objective Vital signs in last 24 hours: Vitals:   02/29/20 0300 02/29/20 0332 02/29/20 0400 02/29/20 0700  BP: 91/64  (!) 93/58   Pulse: 77 85 84 94  Resp: 18 (!) 28 19 (!) 24  Temp:      TempSrc:      SpO2: 100%  100% 100%  Weight:      Height:       Weight change:   Intake/Output Summary (Last 24 hours) at 02/29/2020 0729 Last data filed at 02/29/2020 0700 Gross per 24 hour  Intake 3293.47 ml  Output 5889 ml  Net -2595.53 ml    Labs: Basic Metabolic Panel: Recent Labs  Lab 02/28/20 0331 02/28/20 1604 02/29/20 0322  NA 135 134* 132*  K 4.3 4.2 4.4  CL 102 102 99  CO2 _0 GLUCOSE 133* 113* 139*  BUN 45* 43* 44*  CREATININE 0.90 0.84 0.92  CALCIUM 7.9* 8.0* 8.1*  PHOS 2.0* 1.8* 1.5*   Liver Function Tests: Recent Labs  Lab 02/28/20 0331 02/28/20 1604 02/29/20 0322  ALBUMIN 1.9* 2.0* 2.1*   No results for input(s): LIPASE, AMYLASE in the last 168 hours. No results for input(s): AMMONIA in the last 168 hours. CBC: Recent Labs  Lab 02/25/20 0417 02/26/20 0402 02/27/20 3557 02/28/20 3220 02/29/20 2542  WBC 13.4* 15.5* 15.3* 12.6* 17.0*  NEUTROABS 11.1* 12.9* 12.6* 10.2* 14.3*  HGB 8.8* 8.9* 8.9* 9.1* 9.7*  HCT 31.4* 31.0* 31.4* 32.0* 33.8*  MCV 93.5 93.4 93.2 93.6 92.3  PLT 332 321 339 336 394   Cardiac Enzymes: No results for input(s): CKTOTAL, CKMB, CKMBINDEX, TROPONINI in the last 168 hours. CBG: Recent Labs  Lab 02/28/20 1201 02/28/20 1608 02/28/20 1940 02/28/20 2338 02/29/20 0331  GLUCAP 164* 111* 126* 147* 142*    Iron Studies: No results for input(s): IRON, TIBC, TRANSFERRIN, FERRITIN in the last 72 hours. Studies/Results: DG CHEST PORT 1 VIEW  Result Date: 02/28/2020 CLINICAL DATA:  Respiratory failure EXAM: PORTABLE CHEST 1 VIEW COMPARISON:  02/23/2020 FINDINGS: Interval removal of right-sided chest tube. Tracheostomy  tube remains in place. Left-sided implanted cardiac device. Right-sided PICC line terminates within the SVC. Stable cardiomegaly. Stable pleural based density at the right lung apex. Lungs otherwise clear. No pneumothorax. IMPRESSION: Interval removal of right-sided chest tube. No pneumothorax. Electronically Signed   By: Davina Poke D.O.   On: 02/28/2020 07:56    Medications: Infusions: .  prismasol BGK 4/2.5 500 mL/hr at 02/29/20 0644  .  prismasol BGK 4/2.5 200 mL/hr at 02/28/20 2135  . sodium chloride 10 mL/hr at 02/29/20 0700  . sodium chloride    . dexmedetomidine (PRECEDEX) IV infusion 0.8 mcg/kg/hr (02/29/20 0700)  . dextrose Stopped (02/08/2020 1846)  . DOBUTamine 5 mcg/kg/min (02/29/20 0700)  . feeding supplement (PIVOT 1.5 CAL) 1,000 mL (02/29/20 0449)  . heparin 1,750 Units/hr (02/29/20 0700)  . HYDROmorphone Stopped (02/28/20 1832)  . lactated ringers 20 mL/hr at 02/29/20 0700  . prismasol BGK 4/2.5 1,500 mL/hr at 02/29/20 6789    Scheduled Medications: . sodium chloride   Intravenous Once  . amiodarone  200 mg Per Tube BID  . vitamin C  500 mg Per Tube TID  . aspirin  324 mg Per Tube Daily  . atorvastatin  80 mg Per Tube Daily  . busPIRone  10 mg Per Tube TID  . chlorhexidine gluconate (MEDLINE KIT)  15 mL Mouth Rinse BID  . Chlorhexidine Gluconate Cloth  6 each Topical Daily  . darbepoetin (ARANESP) injection - NON-DIALYSIS  100 mcg Subcutaneous Q Sat-1800  . feeding supplement (PROSource TF)  45 mL Per Tube TID  . fiber  1 packet Per Tube BID  . FLUoxetine  40 mg Per Tube BID  . HYDROmorphone  4 mg Per Tube Q6H  . insulin aspart  0-9 Units Subcutaneous Q4H  . insulin aspart  4 Units Subcutaneous Q4H  . insulin detemir  12 Units Subcutaneous Q12H  . LORazepam  1 mg Intravenous Q4H  . mouth rinse  15 mL Mouth Rinse 10 times per day  . melatonin  5 mg Per Tube Daily  . mexiletine  150 mg Per Tube Q8H  . midodrine  20 mg Per Tube TID WC  . multivitamin  1  tablet Per Tube QHS  . nutrition supplement (JUVEN)  1 packet Per Tube BID BM  . pantoprazole (PROTONIX) IV  40 mg Intravenous Q12H  . sodium chloride flush  10-40 mL Intracatheter Q12H  . thiamine  100 mg Per Tube Daily  . valproic acid  250 mg Per Tube BID    have reviewed scheduled and prn medications.  Physical Exam: General: Critically ill looking male sedated and on vent via trach.  Heart:RRR, s1s2 nl Lungs: Coarse breath sound bilateral Abdomen:soft, Non-tender, non-distended Extremities pitting edema to dep  areas Dialysis Access:  right femoral catheter site clean.  In for 10 days   Louis Meckel 02/29/2020,7:29 AM  LOS: 61 days

## 2020-02-29 NOTE — Plan of Care (Signed)
  Problem: Cardiac: Goal: Ability to achieve and maintain adequate cardiopulmonary perfusion will improve Outcome: Progressing   Problem: Education: Goal: Knowledge of General Education information will improve Description: Including pain rating scale, medication(s)/side effects and non-pharmacologic comfort measures Outcome: Progressing   Problem: Health Behavior/Discharge Planning: Goal: Ability to manage health-related needs will improve Outcome: Progressing   Problem: Clinical Measurements: Goal: Ability to maintain clinical measurements within normal limits will improve Outcome: Progressing Goal: Diagnostic test results will improve Outcome: Progressing Goal: Respiratory complications will improve Outcome: Progressing Goal: Cardiovascular complication will be avoided Outcome: Progressing   Problem: Activity: Goal: Risk for activity intolerance will decrease Outcome: Progressing   Problem: Coping: Goal: Level of anxiety will decrease Outcome: Progressing   Problem: Skin Integrity: Goal: Risk for impaired skin integrity will decrease Outcome: Progressing   Problem: Education: Goal: Ability to demonstrate management of disease process will improve Outcome: Progressing Goal: Ability to verbalize understanding of medication therapies will improve Outcome: Progressing   Problem: Cardiac: Goal: Ability to achieve and maintain adequate cardiopulmonary perfusion will improve Outcome: Progressing   Problem: Education: Goal: Ability to verbalize understanding of medication therapies will improve Outcome: Progressing   Problem: Activity: Goal: Capacity to carry out activities will improve Outcome: Progressing   Problem: Cardiac: Goal: Ability to achieve and maintain adequate cardiopulmonary perfusion will improve Outcome: Progressing   Problem: Education: Goal: Will demonstrate proper wound care and an understanding of methods to prevent future damage Outcome:  Progressing Goal: Knowledge of disease or condition will improve Outcome: Progressing Goal: Knowledge of the prescribed therapeutic regimen will improve Outcome: Progressing Goal: Individualized Educational Video(s) Outcome: Progressing   Problem: Cardiac: Goal: Will achieve and/or maintain hemodynamic stability Outcome: Progressing   Problem: Clinical Measurements: Goal: Postoperative complications will be avoided or minimized Outcome: Progressing   Problem: Respiratory: Goal: Respiratory status will improve Outcome: Progressing   Problem: Skin Integrity: Goal: Wound healing without signs and symptoms of infection Outcome: Progressing Goal: Risk for impaired skin integrity will decrease Outcome: Progressing

## 2020-02-29 NOTE — Progress Notes (Signed)
[facility] BRIEF SICU PROGRESS NOTE  5 Days Post-Op  S/P Procedure(s) (LRB): Sternal wound debridement (N/A) APPLICATION OF A-CELL (N/A) Wound vac change (N/A)   Stable day  Plan: Continue current plan  Rexene Alberts, MD 02/29/2020 7:01 PM

## 2020-02-29 NOTE — Progress Notes (Signed)
Physical Therapy Treatment Patient Details Name: Corey Palmer MRN: [id number] DOB: 1970/02/23 Today's Date: 02/29/2020    History of Present Illness 50 yo male admitted 12/1 with A. fib/RVR, torsades, cardiogenic shock (EF 15) requiring Impella placement (removed 1/21), CABG x 4 c/b cardiac tamponade requiring chest reopening (now with VAC), AKI requiring initiation of CRRT. PMH including anxiety, arthritis, CAD, CHF, DM type ll, HTN, obesity, pacemaker, paroxysmal a-fib, and sleep apnea.    PT Comments    Pt admitted with above diagnosis. Pt very lethargic and was able to move UEs a little with commands but little effort and was sleeping a lot during the treatment. Nurse stated she did have to give meds earlier and requests PT come before 10 am therefore will try to change time for future as able.  P/ROM to AA/ROM to bil UEs and left LE (limited with right LE due to fem line).  Will continue to follow acutely.  Pt currently with functional limitations due to the deficits listed below (see PT Problem List). Pt will benefit from skilled PT to increase their independence and safety with mobility to allow discharge to the venue listed below.     Follow Up Recommendations  SNF;Supervision/Assistance - 24 hour (will continue to assess)     Equipment Recommendations   (will continue to access with progression of mobiltiy)    Recommendations for Other Services       Precautions / Restrictions Precautions Precautions: Fall;Sternal Precaution Booklet Issued: No (pt not awake enough to comprehend) Precaution Comments: CRRT via R groin line, Vent on 5 of peep, wound vac on chest, continuous O2 at 30% Restrictions Other Position/Activity Restrictions: sternal precautions    Mobility  Bed Mobility Overal bed mobility: Needs Assistance             General bed mobility comments: Total A for repositioning  Transfers                 General transfer comment: unable due to CRRT right  groin  Ambulation/Gait             General Gait Details: unable   Stairs             Wheelchair Mobility    Modified Rankin (Stroke Patients Only)       Balance Overall balance assessment:  (unable due to R groin CRRT line)                                          Cognition Arousal/Alertness: Awake/alert Behavior During Therapy: Flat affect Overall Cognitive Status: Impaired/Different from baseline Area of Impairment: Following commands                   Current Attention Level:  (to lethargic to engaged for longer than a couple of seconds)   Following Commands: Follows one step commands inconsistently       General Comments: Kept eye closed but assisted me with about 25% of ROM to extremties x4      Exercises General Exercises - Upper Extremity Shoulder Flexion: PROM;Both;10 reps;Supine (within sternal precautions) Shoulder ABduction: PROM;Both;10 reps;Supine (within sternal precautions) Elbow Flexion: PROM;Both;10 reps;Seated Elbow Extension: PROM;Both;10 reps;Supine Wrist Flexion: PROM;Both;10 reps;Supine Wrist Extension: PROM;Both;10 reps;Supine Digit Composite Flexion: PROM;Left;10 reps;Supine Composite Extension: PROM;Left;10 reps;Supine General Exercises - Lower Extremity Ankle Circles/Pumps: PROM;Both;10 reps;Supine Quad Sets: AAROM;Both;5 reps;Supine Heel Slides: PROM;Left;10 reps;Supine (R  limited by R hip ROM restriction due to CRRT line) Hip ABduction/ADduction: PROM;Both;5 reps;Supine Other Exercises Other Exercises: P/AAROM to all 4 extremties x 10reps with limting RLE to only hip abduction/adduction and ankle pumps. Pt displayed 1-2/5 strength throughout with needing consistent cues and increased time to continue to help with ROM. Pt hands are not swollen today but are stiff. He is moving both UEs spontaneously but not purposefully.    General Comments        Pertinent Vitals/Pain Pain Assessment:  Faces Faces Pain Scale: Hurts worst Pain Location: left knee flexion Pain Descriptors / Indicators: Grimacing;Guarding Pain Intervention(s): Limited activity within patient's tolerance;Monitored during session;Repositioned    Home Living                      Prior Function            PT Goals (current goals can now be found in the care plan section) Acute Rehab PT Goals Patient Stated Goal: unstated Progress towards PT goals: Not progressing toward goals - comment (lethargic and limited by lines)    Frequency    Min 2X/week      PT Plan      Co-evaluation              AM-PAC PT "6 Clicks" Mobility   Outcome Measure  Help needed turning from your back to your side while in a flat bed without using bedrails?: Total Help needed moving from lying on your back to sitting on the side of a flat bed without using bedrails?: Total Help needed moving to and from a bed to a chair (including a wheelchair)?: Total Help needed standing up from a chair using your arms (e.g., wheelchair or bedside chair)?: Total Help needed to walk in hospital room?: Total Help needed climbing 3-5 steps with a railing? : Total 6 Click Score: 6    End of Session   Activity Tolerance: Other (comment) (limited by CRRT line and lethargy) Patient left: in bed;with nursing/sitter in room;with call bell/phone within reach Nurse Communication: Mobility status;Need for lift equipment PT Visit Diagnosis: Muscle weakness (generalized) (M62.81)     Time: 3267-1245 PT Time Calculation (min) (ACUTE ONLY): 17 min  Charges:  $Therapeutic Exercise: 8-22 mins                     Jaidee Stipe W,PT Acute Rehabilitation Services Pager:  [phone]  Office:  [place] 02/29/2020, 12:18 PM

## 2020-02-29 NOTE — Progress Notes (Signed)
[facility] for Heparin Indication: Afib  Allergies  Allergen Reactions  . Orange Fruit Anaphylaxis, Hives and Other (See Comments)    Per Pt- Blisters around lips and Hives all over, also  . Penicillins Anaphylaxis    Did it involve swelling of the face/tongue/throat, SOB, or low BP? Yes Did it involve sudden or severe rash/hives, skin peeling, or any reaction on the inside of your mouth or nose? Yes Did you need to seek medical attention at a hospital or doctor's office? No When did it last happen?childhood If all above answers are "NO", may proceed with cephalosporin use.  [name] [Empagliflozin] Itching  . Basaglar Kwikpen [Insulin Glargine] Nausea And Vomiting    Patient Measurements: Height: 6\' 3"  (190.5 cm) Weight: 81.6 kg (179 lb 14.3 oz) IBW/kg (Calculated) : 84.5 Heparin dosing wt: 101 kg  Vital Signs: Temp: 98.6 F (37 C) (02/01 0800) Temp Source: Axillary (02/01 0800) BP: 96/66 (02/01 0900) Pulse Rate: 82 (02/01 0900)  Labs: Recent Labs    02/27/20 0436 02/27/20 1543 02/28/20 0331 02/28/20 0332 02/28/20 1604 02/29/20 0322  HGB 8.9*  --   --  9.1*  --  9.7*  HCT 31.4*  --   --  32.0*  --  33.8*  PLT 339  --   --  336  --  394  HEPARINUNFRC 0.33  --   --  0.41  --  0.34  CREATININE 0.94   < > 0.90  --  0.84 0.92   < > = values in this interval not displayed.    Estimated Creatinine Clearance: 109.6 mL/min (by C-G formula based on SCr of 0.92 mg/dL).   Assessment: 30 yoM with hx PAF on apixaban PTA. Pt admitted with shock and required Impella 5.5 support. CABG 12/15 with chest left open, c/b hematoma and significant bleeding. Ultimately heparin started via purge for Impella. DCCV performed 12/16, partial chest closure 1/5, Impella removed 1/21. Heparin resumed for AFib 1/22.  Heparin level at goal 0.3 on heparin drip rate 1750 uts/hr. CBC stable, no bleeding or infusion issues noted.   Goal of Therapy:   Heparin level ~0.3  Monitor platelets by anticoagulation protocol: Yes   Plan:  Continue  heparin to 1750 units/h Monitor daily HL, CBC, s/sx bleeding   Bonnita Nasuti Pharm.D. CPP, BCPS Clinical Pharmacist [phone] 02/29/2020 9:41 AM     Please check [email or web address] for unit-specific pharmacy phone numbers.

## 2020-02-29 DEATH — deceased

## 2020-03-01 ENCOUNTER — Encounter (HOSPITAL_COMMUNITY): Admission: EM | Disposition: E | Payer: Self-pay | Source: Home / Self Care | Attending: Cardiothoracic Surgery

## 2020-03-01 ENCOUNTER — Inpatient Hospital Stay (HOSPITAL_COMMUNITY): Payer: BC Managed Care – PPO | Admitting: Certified Registered Nurse Anesthetist

## 2020-03-01 ENCOUNTER — Inpatient Hospital Stay (HOSPITAL_COMMUNITY): Payer: BC Managed Care – PPO

## 2020-03-01 ENCOUNTER — Encounter (HOSPITAL_COMMUNITY): Payer: Self-pay | Admitting: Cardiothoracic Surgery

## 2020-03-01 DIAGNOSIS — J9601 Acute respiratory failure with hypoxia: Secondary | ICD-10-CM | POA: Diagnosis not present

## 2020-03-01 DIAGNOSIS — R570 Cardiogenic shock: Secondary | ICD-10-CM | POA: Diagnosis not present

## 2020-03-01 DIAGNOSIS — N179 Acute kidney failure, unspecified: Secondary | ICD-10-CM | POA: Diagnosis not present

## 2020-03-01 DIAGNOSIS — S21109A Unspecified open wound of unspecified front wall of thorax without penetration into thoracic cavity, initial encounter: Secondary | ICD-10-CM | POA: Diagnosis not present

## 2020-03-01 HISTORY — PX: INCISION AND DRAINAGE OF WOUND: SHX1803

## 2020-03-01 HISTORY — PX: APPLICATION OF WOUND VAC: SHX5189

## 2020-03-01 HISTORY — PX: APPLICATION OF A-CELL OF EXTREMITY: SHX6303

## 2020-03-01 LAB — GLUCOSE, CAPILLARY
Glucose-Capillary: 164 mg/dL — ABNORMAL HIGH (ref 70–99)
Glucose-Capillary: 108 mg/dL — ABNORMAL HIGH (ref 70–99)
Glucose-Capillary: >600 mg/dL (ref 70–99)
Glucose-Capillary: 128 mg/dL — ABNORMAL HIGH (ref 70–99)

## 2020-03-01 LAB — RENAL FUNCTION PANEL
Albumin: 2 g/dL — ABNORMAL LOW (ref 3.5–5.0)
Albumin: 1.8 g/dL — ABNORMAL LOW (ref 3.5–5.0)
Anion gap: 10 (ref 5–15)
Anion gap: 8 (ref 5–15)
BUN: 41 mg/dL — ABNORMAL HIGH (ref 6–20)
BUN: 38 mg/dL — ABNORMAL HIGH (ref 6–20)
CO2: 25 mmol/L (ref 22–32)
CO2: 24 mmol/L (ref 22–32)
Calcium: 8.6 mg/dL — ABNORMAL LOW (ref 8.9–10.3)
Calcium: 8.2 mg/dL — ABNORMAL LOW (ref 8.9–10.3)
Chloride: 99 mmol/L (ref 98–111)
Chloride: 97 mmol/L — ABNORMAL LOW (ref 98–111)
Creatinine, Ser: 0.85 mg/dL (ref 0.61–1.24)
Creatinine, Ser: 1.06 mg/dL (ref 0.61–1.24)
GFR, Estimated: >60 mL/min (ref >60)
GFR, Estimated: >60 mL/min (ref >60)
Glucose, Bld: 97 mg/dL (ref 70–99)
Glucose, Bld: 327 mg/dL — ABNORMAL HIGH (ref 70–99)
Phosphorus: 2 mg/dL — ABNORMAL LOW (ref 2.5–4.6)
Phosphorus: 3.5 mg/dL (ref 2.5–4.6)
Potassium: 4.2 mmol/L (ref 3.5–5.1)
Potassium: 4.8 mmol/L (ref 3.5–5.1)
Sodium: 134 mmol/L — ABNORMAL LOW (ref 135–145)
Sodium: 129 mmol/L — ABNORMAL LOW (ref 135–145)

## 2020-03-01 LAB — CBC WITH DIFFERENTIAL/PLATELET
Abs Immature Granulocytes: 0.12 10*3/uL — ABNORMAL HIGH (ref 0.00–0.07)
Basophils Absolute: 0 10*3/uL (ref 0.0–0.1)
Basophils Relative: 0 %
Eosinophils Absolute: 0.2 10*3/uL (ref 0.0–0.5)
Eosinophils Relative: 1 %
HCT: 34.3 % — ABNORMAL LOW (ref 39.0–52.0)
Hemoglobin: 10 g/dL — ABNORMAL LOW (ref 13.0–17.0)
Immature Granulocytes: 1 %
Lymphocytes Relative: 8 %
Lymphs Abs: 1.2 10*3/uL (ref 0.7–4.0)
MCH: 26.7 pg (ref 26.0–34.0)
MCHC: 29.2 g/dL — ABNORMAL LOW (ref 30.0–36.0)
MCV: 91.7 fL (ref 80.0–100.0)
Monocytes Absolute: 1.4 10*3/uL — ABNORMAL HIGH (ref 0.1–1.0)
Monocytes Relative: 9 %
Neutro Abs: 12 10*3/uL — ABNORMAL HIGH (ref 1.7–7.7)
Neutrophils Relative %: 81 %
Platelets: 390 10*3/uL (ref 150–400)
RBC: 3.74 MIL/uL — ABNORMAL LOW (ref 4.22–5.81)
RDW: 19.9 % — ABNORMAL HIGH (ref 11.5–15.5)
WBC: 14.8 10*3/uL — ABNORMAL HIGH (ref 4.0–10.5)
nRBC: 0 % (ref 0.0–0.2)

## 2020-03-01 LAB — MAGNESIUM: Magnesium: 2.5 mg/dL — ABNORMAL HIGH (ref 1.7–2.4)

## 2020-03-01 LAB — COOXEMETRY PANEL
Carboxyhemoglobin: 2.1 % — ABNORMAL HIGH (ref 0.5–1.5)
Methemoglobin: 0.7 % (ref 0.0–1.5)
O2 Saturation: 60.2 %
Total hemoglobin: 10.6 g/dL — ABNORMAL LOW (ref 12.0–16.0)

## 2020-03-01 LAB — HEPARIN LEVEL (UNFRACTIONATED): Heparin Unfractionated: 0.37 IU/mL (ref 0.30–0.70)

## 2020-03-01 SURGERY — IRRIGATION AND DEBRIDEMENT WOUND
Anesthesia: General | Site: Chest

## 2020-03-01 MED ORDER — ONDANSETRON HCL 4 MG/2ML IJ SOLN
INTRAMUSCULAR | Status: DC | PRN
  Administered 2020-03-01: 4 mg via INTRAVENOUS

## 2020-03-01 MED ORDER — SUGAMMADEX SODIUM 200 MG/2ML IV SOLN
INTRAVENOUS | Status: DC | PRN
  Administered 2020-03-01: 200 mg via INTRAVENOUS

## 2020-03-01 MED ORDER — LACTATED RINGERS IV SOLN: INTRAVENOUS | Status: DC | PRN

## 2020-03-01 MED ORDER — FENTANYL CITRATE (PF) 250 MCG/5ML IJ SOLN
INTRAMUSCULAR | Status: AC
  Filled 2020-03-01: qty 5

## 2020-03-01 MED ORDER — ROCURONIUM BROMIDE 10 MG/ML (PF) SYRINGE
PREFILLED_SYRINGE | INTRAVENOUS | Status: DC | PRN
  Administered 2020-03-01: 40 mg via INTRAVENOUS

## 2020-03-01 MED ORDER — PHENYLEPHRINE 40 MCG/ML (10ML) SYRINGE FOR IV PUSH (FOR BLOOD PRESSURE SUPPORT)
PREFILLED_SYRINGE | INTRAVENOUS | Status: DC | PRN
  Administered 2020-03-01: 80 ug via INTRAVENOUS
  Administered 2020-03-01: 120 ug via INTRAVENOUS
  Administered 2020-03-01 (×4): 80 ug via INTRAVENOUS
  Administered 2020-03-01: 120 ug via INTRAVENOUS

## 2020-03-01 MED ORDER — POTASSIUM PHOSPHATES 15 MMOLE/5ML IV SOLN
20.0000 mmol | Freq: Once | INTRAVENOUS | Status: DC
  Administered 2020-03-01: 20 mmol via INTRAVENOUS
  Filled 2020-03-01: qty 6.67

## 2020-03-01 MED ORDER — MIDAZOLAM HCL 2 MG/2ML IJ SOLN
INTRAMUSCULAR | Status: DC | PRN
  Administered 2020-03-01 (×2): 1 mg via INTRAVENOUS

## 2020-03-01 MED ORDER — SODIUM CHLORIDE 0.9 % IV SOLN
INTRAVENOUS | Status: DC
  Filled 2020-03-01: qty 10

## 2020-03-01 MED ORDER — HEMOSTATIC AGENTS (NO CHARGE) OPTIME
TOPICAL | Status: DC | PRN
  Administered 2020-03-01 (×2): 1 via TOPICAL

## 2020-03-01 MED ORDER — 0.9 % SODIUM CHLORIDE (POUR BTL) OPTIME
TOPICAL | Status: DC | PRN
  Administered 2020-03-01: 1000 mL

## 2020-03-01 MED ORDER — MIDAZOLAM HCL 2 MG/2ML IJ SOLN
INTRAMUSCULAR | Status: AC
  Filled 2020-03-01: qty 2

## 2020-03-01 MED ORDER — FENTANYL CITRATE (PF) 250 MCG/5ML IJ SOLN
INTRAMUSCULAR | Status: DC | PRN
  Administered 2020-03-01 (×2): 50 ug via INTRAVENOUS

## 2020-03-01 MED ORDER — HEPARIN (PORCINE) 25000 UT/250ML-% IV SOLN
1750.0000 [IU]/h | INTRAVENOUS | Status: DC
  Administered 2020-03-01 – 2020-03-02 (×2): 1750 [IU]/h via INTRAVENOUS
  Filled 2020-03-01 (×3): qty 250

## 2020-03-01 MED ORDER — LIDOCAINE-EPINEPHRINE 1 %-1:100000 IJ SOLN
INTRAMUSCULAR | Status: AC
  Filled 2020-03-01: qty 1

## 2020-03-01 SURGICAL SUPPLY — 71 items
AGENT HMST 10 BLLW SHRT CANN (HEMOSTASIS) ×1
APL SKNCLS STERI-STRIP NONHPOA (GAUZE/BANDAGES/DRESSINGS) ×1
APL SWBSTK 6 STRL LF DISP (MISCELLANEOUS)
APPLICATOR COTTON TIP 6 STRL (MISCELLANEOUS) IMPLANT
APPLICATOR COTTON TIP 6IN STRL (MISCELLANEOUS) IMPLANT
BAG DECANTER FOR FLEXI CONT (MISCELLANEOUS) IMPLANT
BENZOIN TINCTURE PRP APPL 2/3 (GAUZE/BANDAGES/DRESSINGS) ×2 IMPLANT
CANISTER SUCT 3000ML PPV (MISCELLANEOUS) ×2 IMPLANT
CANISTER WOUND CARE 500ML ATS (WOUND CARE) ×2 IMPLANT
CNTNR URN SCR LID CUP LEK RST (MISCELLANEOUS) IMPLANT
CONT SPEC 4OZ STRL OR WHT (MISCELLANEOUS)
COVER SURGICAL LIGHT HANDLE (MISCELLANEOUS) ×4 IMPLANT
COVER WAND RF STERILE (DRAPES) ×2 IMPLANT
DRAIN CHANNEL 19F RND (DRAIN) IMPLANT
DRAIN JP 10F RND SILICONE (MISCELLANEOUS) IMPLANT
DRAPE HALF SHEET 40X57 (DRAPES) IMPLANT
DRAPE IMP U-DRAPE 54X76 (DRAPES) ×2 IMPLANT
DRAPE INCISE IOBAN 66X45 STRL (DRAPES) ×1 IMPLANT
DRAPE LAPAROSCOPIC ABDOMINAL (DRAPES) IMPLANT
DRAPE LAPAROTOMY 100X72 PEDS (DRAPES) ×2 IMPLANT
DRSG ADAPTIC 3X8 NADH LF (GAUZE/BANDAGES/DRESSINGS) IMPLANT
DRSG CUTIMED SORBACT 7X9 (GAUZE/BANDAGES/DRESSINGS) ×3 IMPLANT
DRSG HYDROCOLLOID 4X4 (GAUZE/BANDAGES/DRESSINGS) ×1 IMPLANT
DRSG PAD ABDOMINAL 8X10 ST (GAUZE/BANDAGES/DRESSINGS) IMPLANT
DRSG VAC ATS LRG SENSATRAC (GAUZE/BANDAGES/DRESSINGS) IMPLANT
DRSG VAC ATS MED SENSATRAC (GAUZE/BANDAGES/DRESSINGS) ×1 IMPLANT
DRSG VAC ATS SM SENSATRAC (GAUZE/BANDAGES/DRESSINGS) IMPLANT
DRSG VERSA FOAM LRG 10X15 (GAUZE/BANDAGES/DRESSINGS) ×1 IMPLANT
ELECT CAUTERY BLADE 6.4 (BLADE) ×2 IMPLANT
ELECT REM PT RETURN 9FT ADLT (ELECTROSURGICAL) ×2
ELECTRODE REM PT RTRN 9FT ADLT (ELECTROSURGICAL) ×1 IMPLANT
GAUZE SPONGE 4X4 12PLY STRL (GAUZE/BANDAGES/DRESSINGS) ×3 IMPLANT
GEL ULTRASOUND 20GR AQUASONIC (MISCELLANEOUS) IMPLANT
GLOVE BIO SURGEON STRL SZ 6.5 (GLOVE) ×4 IMPLANT
GLOVE BIOGEL M 6.5 STRL (GLOVE) ×2 IMPLANT
GOWN STRL REUS W/ TWL LRG LVL3 (GOWN DISPOSABLE) ×3 IMPLANT
GOWN STRL REUS W/TWL LRG LVL3 (GOWN DISPOSABLE) ×6
HEMOSTAT HEMOBLAST BELLOWS (HEMOSTASIS) ×1 IMPLANT
KIT BASIN OR (CUSTOM PROCEDURE TRAY) ×2 IMPLANT
KIT TURNOVER KIT B (KITS) ×2 IMPLANT
MATRIX WOUND 3-LAYER 7X10 (Tissue) ×1 IMPLANT
MICROMATRIX 1000MG (Tissue) ×2 IMPLANT
NDL HYPO 25GX1X1/2 BEV (NEEDLE) ×1 IMPLANT
NEEDLE HYPO 25GX1X1/2 BEV (NEEDLE) ×2 IMPLANT
NS IRRIG 1000ML POUR BTL (IV SOLUTION) ×2 IMPLANT
PACK GENERAL/GYN (CUSTOM PROCEDURE TRAY) ×2 IMPLANT
PACK UNIVERSAL I (CUSTOM PROCEDURE TRAY) ×2 IMPLANT
PAD ARMBOARD 7.5X6 YLW CONV (MISCELLANEOUS) ×4 IMPLANT
POWDER MYRIAD MORCELLS 1000MG (Miscellaneous) ×1 IMPLANT
SOLUTION PARTIC MCRMTRX 1000MG (Tissue) IMPLANT
STAPLER VISISTAT 35W (STAPLE) ×2 IMPLANT
STOCKINETTE IMPERVIOUS 9X36 MD (GAUZE/BANDAGES/DRESSINGS) IMPLANT
STOCKINETTE IMPERVIOUS LG (DRAPES) IMPLANT
SURGILUBE 2OZ TUBE FLIPTOP (MISCELLANEOUS) ×2 IMPLANT
SUT MNCRL AB 3-0 PS2 27 (SUTURE) ×1 IMPLANT
SUT MNCRL AB 4-0 PS2 18 (SUTURE) IMPLANT
SUT MON AB 2-0 CT1 36 (SUTURE) IMPLANT
SUT MON AB 5-0 PS2 18 (SUTURE) IMPLANT
SUT PDS AB 2-0 CT1 27 (SUTURE) ×1 IMPLANT
SUT SILK 4 0 P 3 (SUTURE) IMPLANT
SUT VIC AB 5-0 PS2 18 (SUTURE) ×4 IMPLANT
SUT VICRYL 3 0 (SUTURE) IMPLANT
SWAB COLLECTION DEVICE MRSA (MISCELLANEOUS) IMPLANT
SWAB CULTURE ESWAB REG 1ML (MISCELLANEOUS) IMPLANT
SYR BULB IRRIG 60ML STRL (SYRINGE) ×1 IMPLANT
SYR CONTROL 10ML LL (SYRINGE) ×2 IMPLANT
TOWEL GREEN STERILE (TOWEL DISPOSABLE) ×2 IMPLANT
TOWEL GREEN STERILE FF (TOWEL DISPOSABLE) IMPLANT
TUBE CONNECTING 12X1/4 (SUCTIONS) ×2 IMPLANT
UNDERPAD 30X36 HEAVY ABSORB (UNDERPADS AND DIAPERS) ×2 IMPLANT
YANKAUER SUCT BULB TIP NO VENT (SUCTIONS) ×2 IMPLANT

## 2020-03-01 NOTE — Plan of Care (Signed)
Goal: Respiratory complications will improve Outcome: Progressing Goal: Cardiovascular complication will be avoided Outcome: Progressing   Problem: Coping: Goal: Level of anxiety will decrease Outcome: Progressing   Problem: Respiratory: Goal: Respiratory status will improve Outcome: Progressing   Problem: Skin Integrity: Goal: Wound healing without signs and symptoms of infection Outcome: Progressing Goal: Risk for impaired skin integrity will decrease Outcome: Progressing    Problem: Respiratory: Goal: Patent airway maintenance will improve Outcome: Progressing

## 2020-03-01 NOTE — Anesthesia Procedure Notes (Signed)
Date/Time: 02/29/2020 1:05 PM Performed by: Amadeo Garnet, CRNA Pre-anesthesia Checklist: Patient identified, Emergency Drugs available, Suction available and Patient being monitored Patient Re-evaluated:Patient Re-evaluated prior to induction Oxygen Delivery Method: Circle system utilized Preoxygenation: Pre-oxygenation with 100% oxygen Induction Type: Inhalational induction and Tracheostomy Airway Equipment and Method: Tracheostomy Placement Confirmation: positive ETCO2 and breath sounds checked- equal and bilateral Dental Injury: Teeth and Oropharynx as per pre-operative assessment

## 2020-03-01 NOTE — Op Note (Signed)
DATE OF OPERATION: 03/27/2020  LOCATION: [facility] Inpatient  PREOPERATIVE DIAGNOSIS: sternal wound  POSTOPERATIVE DIAGNOSIS: Same  PROCEDURE: 1.  Excision of sternal wound 12 cm skin and soft tissue 2.  Closure of superior wound 1 cm 3.  Placement of Myriad powder 1 gm 4.  Placement of Acell powder 1 gm and Sheet 7 x 10 cm 5.  Placement of VAC sponge.  SURGEON: Nathanyl Andujo Sanger Lorenz Donley, DO  ASSISTANT: Phoebe Sharps, PA  EBL: 10 cc  CONDITION: Stable  COMPLICATIONS: None  INDICATION: The patient, Corey Palmer, is a 50 y.o. male born on 13-Dec-1970, is here for treatment of a sternal wound.   PROCEDURE DETAILS:  The patient was seen prior to surgery and marked.  The IV antibiotics were given. The patient was taken to the operating room and given a general anesthetic. A standard time out was performed and all information was confirmed by those in the room.  The patient was prepped and draped with scrub and Betadine.  There did not appear to be any incorporation of the myriad sheet.  This was removed.  There was not a hematoma just a little bit of coagulated blood.  Copious amounts of irrigation was used to clean the wound.  I then used a #10 blade to excise edges of the 12 cm skin and soft tissue wound.  A curette was used to scrape underneath the flaps and remove any coagulated bladder nonviable tissue.  Hemostasis was achieved with electrocautery.  Hemovac blast was then used and covered with a moistened towel.  The superior aspect of the wound was closed with a 2-0 PDS for 1 cm.  This was done to try and separate the wound from the trach area.  The 1 g of ACell powder was placed on the left side the 1 g of myriad powder was placed on the right side.  The ACell 7 x 10 cm sheet was placed on the wound.  The sorbact was applied and both were secured with 5-0 Vicryl.  The VAC sponge was applied and then the black wound on top of it.  An excellent seal was obtained. The patient  taken to the ICU in stable condition at the end of the case. The family was notified at the end of the case.   The advanced practice practitioner (APP) assisted throughout the case.  The APP was essential in retraction and counter traction when needed to make the case progress smoothly.  This retraction and assistance made it possible to see the tissue plans for the procedure.  The assistance was needed for blood control, tissue re-approximation and assisted with closure of the incision site.

## 2020-03-01 NOTE — Progress Notes (Signed)
Patient ID: Corey Palmer, male   DOB: Feb 01, 1970, 50 y.o.   MRN: [id number] EVENING ROUNDS NOTE :     [address]       [place] [address]             [phone]                 Day of Surgery Procedure(s) (LRB): Sternal wound debridement (N/A) APPLICATION OF A-CELL OR MATRIDERM (N/A) APPLICATION OF WOUND VAC (N/A)  Total Length of Stay:  LOS: 62 days  BP 110/80   Pulse 96   Temp (!) 97.5 F (36.4 C) (Axillary)   Resp 18   Ht 6\' 3"  (1.905 m)   Wt 83.7 kg   SpO2 100%   BMI 23.06 kg/m   .Intake/Output      02/01 0701 02/02 0700 02/02 0701 02/03 0700   I.V. (mL/kg) 1271.1 (15.2) 528.9 (6.3)   Other 640 260   NG/GT 1275    IV Piggyback 506.9 67.9   Total Intake(mL/kg) 3693 (44.1) 856.8 (10.2)   Urine (mL/kg/hr)     Drains 150 50   Other 3768 531   Stool 625    Total Output 4543 581   Net -850 +275.8          .  prismasol BGK 4/2.5 500 mL/hr at 02/29/20 1645  .  prismasol BGK 4/2.5 200 mL/hr at 02/28/20 2135  . sodium chloride Stopped (03/03/2020 0753)  . sodium chloride    . dexmedetomidine (PRECEDEX) IV infusion 0.8 mcg/kg/hr (03/21/2020 1416)  . dextrose 40 mL/hr at 03/03/2020 1415  . DOBUTamine 3 mcg/kg/min (03/11/2020 1415)  . feeding supplement (PIVOT 1.5 CAL) Stopped (03/12/2020 0001)  . heparin    . lactated ringers Stopped (02/29/20 0835)  . prismasol BGK 4/2.5 1,500 mL/hr at 03/25/2020 0932     Lab Results  Component Value Date   WBC 14.8 (H) 03/17/2020   HGB 10.0 (L) 03/19/2020   HCT 34.3 (L) 02/29/2020   PLT 390 03/12/2020   GLUCOSE 97 03/06/2020   CHOL 176 10/01/2018   TRIG 126 10/01/2018   HDL 25 (L) 10/01/2018   LDLDIRECT 116.5 11/01/2011   LDLCALC 126 (H) 10/01/2018   ALT 51 (H) 02/16/2020   AST 57 (H) 02/16/2020   NA 134 (L) 03/17/2020   K 4.2 03/14/2020   CL 99 03/25/2020   CREATININE 0.85 03/14/2020   BUN 41 (H) 03/25/2020   CO2 25 03/24/2020   TSH 3.009 10/01/2018   INR 1.3 (H) 02/06/2020   HGBA1C 10.6 (H)  01/03/2020   Back from or today per plastic surgery and wound debridement - wound vac in place   Grace Isaac MD  Beeper [phone] Office [phone] 03/14/2020 3:02 PM

## 2020-03-01 NOTE — Transfer of Care (Signed)
Immediate Anesthesia Transfer of Care Note  Patient: Corey Palmer  Procedure(s) Performed: Sternal wound debridement (N/A Chest) APPLICATION OF A-CELL OR MATRIDERM (N/A Chest) APPLICATION OF WOUND VAC (N/A Chest)  Patient Location: SICU  Anesthesia Type:General  Level of Consciousness: lethargic, responds to stimulation and Patient remains intubated per anesthesia plan  Airway & Oxygen Therapy: Patient Spontanous Breathing, Patient connected to tracheostomy mask oxygen and Patient placed on Ventilator (see vital sign flow sheet for setting)  Post-op Assessment: Report given to RN  Post vital signs: Reviewed and stable  Last Vitals:  Vitals Value Taken Time  BP 110/79 03/02/2020 1406  Temp    Pulse 85 03/06/2020 1409  Resp 17 03/05/2020 1409  SpO2 100 % 03/02/2020 1409  Vitals shown include unvalidated device data.  Last Pain:  Vitals:   03/22/2020 1200  TempSrc: Axillary  PainSc:       Patients Stated Pain Goal: 0 (10/27/55 4734)  Complications: No complications documented.

## 2020-03-01 NOTE — Progress Notes (Signed)
[facility] for Heparin Indication: Afib  Allergies  Allergen Reactions  . Orange Fruit Anaphylaxis, Hives and Other (See Comments)    Per Pt- Blisters around lips and Hives all over, also  . Penicillins Anaphylaxis    Did it involve swelling of the face/tongue/throat, SOB, or low BP? Yes Did it involve sudden or severe rash/hives, skin peeling, or any reaction on the inside of your mouth or nose? Yes Did you need to seek medical attention at a hospital or doctor's office? No When did it last happen?childhood If all above answers are "NO", may proceed with cephalosporin use.  [name] [Empagliflozin] Itching  . Basaglar Kwikpen [Insulin Glargine] Nausea And Vomiting    Patient Measurements: Height: 6\' 3"  (190.5 cm) Weight: 83.7 kg (184 lb 8.4 oz) IBW/kg (Calculated) : 84.5 Heparin dosing wt: 101 kg  Vital Signs: Temp: 97.3 F (36.3 C) (02/02 0800) Temp Source: Axillary (02/02 0800) BP: 104/73 (02/02 0800) Pulse Rate: 87 (02/02 0806)  Labs: Recent Labs    02/28/20 0332 02/28/20 1604 02/29/20 0322 02/29/20 1612 03/18/2020 0425  HGB 9.1*  --  9.7*  --  10.0*  HCT 32.0*  --  33.8*  --  34.3*  PLT 336  --  394  --  390  HEPARINUNFRC 0.41  --  0.34  --  0.37  CREATININE  --    < > 0.92 0.86 0.85   < > = values in this interval not displayed.    Estimated Creatinine Clearance: 121.7 mL/min (by C-G formula based on SCr of 0.85 mg/dL).   Assessment: 32 yoM with hx PAF on apixaban PTA. Pt admitted with shock and required Impella 5.5 support. CABG 12/15 with chest left open, c/b hematoma and significant bleeding. Ultimately heparin started via purge for Impella. DCCV performed 12/16, partial chest closure 1/5, Impella removed 1/21. Heparin resumed for AFib 1/22.  Heparin level at goal 0.37 on heparin drip rate 1750 uts/hr. CBC stable, no bleeding or infusion issues noted.   Goal of Therapy:  Heparin level ~0.3  Monitor platelets by  anticoagulation protocol: Yes   Plan:  Continue  heparin to 1750 units/h Monitor daily HL, CBC, s/sx bleeding F/u plans for [facility] after OR    Bonnita Nasuti Pharm.D. CPP, BCPS Clinical Pharmacist [phone] 03/05/2020 8:43 AM     Please check [email or web address] for unit-specific pharmacy phone numbers.

## 2020-03-01 NOTE — Interval H&P Note (Signed)
History and Physical Interval Note:  02/29/2020 12:37 PM  Corey Palmer  has presented today for surgery, with the diagnosis of sternal wound.  The various methods of treatment have been discussed with the patient and family. After consideration of risks, benefits and other options for treatment, the patient has consented to  Procedure(s): Sternal wound debridement (N/A) APPLICATION OF A-CELL OR MATRIDERM (N/A) APPLICATION OF WOUND VAC (N/A) as a surgical intervention.  [name] history has been reviewed, patient examined, no change in status, stable for surgery.  I have reviewed the patient's chart and labs.  Questions were answered to the patient's satisfaction.     Loel Lofty Koa Zoeller

## 2020-03-01 NOTE — Progress Notes (Signed)
[facility] NEPHROLOGY PROGRESS NOTE  Assessment/ Plan: Pt is a 50 y.o. yo male  with familial cardiomyopathy EF20% s/p redo CABG 16/60/60 complicated by persistent shock, multiple attempts at sternal closure with formation of chest hematoma and tamponade, Afib with RVR, and AKI requiring CRRT.    # Anuric AKI/CKD stage III- presumably due to ischemic ATN in setting of cardiogenic shock s/p CABG.  Likely ESRD at this point.  CRRT started 01/16/20.  All fluids 4K/2.5Ca.   Continue CRRT, no prescription change, UF is net negative as tolerated- I think we have him dry enough, will back off to running even.  Hyperkalemia improved.  The line was changed to right temp femoral catheter- has been in for 11 days.  He is on inotropic support currently.  At some point need to see if he will tolerate iHD but in light of still open chest we will continue CRRT for now so we don't get too far behind with I/Os -- his intake is typically ~3.5L/day so would need to tolerate fairly robust UF with HD which I'm really not sure he will, waiting for complete chest closure.    # CAD s/p CABG complicated by cardiogenic shock and post-op hematoma. secondary closure on 12/23 then had to be re opened. Went to the OR for washout on 12/28, bedside washout 02/01/19.  OR 02/23/2020 for closure, then had emergent reopening at the bedside 02/17/2020 d/t anterior chest hematoma again. S/p OR plastic surgery 1/27 - excision of wound, 2cm closure. Plan for OR today 2/2  # Cardiogenic shock- underlying familial cardiomyopathy s/p redo CABG, EF ~20%. Remains on inotrope,  Midodrine 20 tid.  The Impella was removed on 1/21.  # ABLA- s/p postoperative bleeding into chest cavity s/p re-exploration for tamponade/hematoma. Received multiple blood products. On ESA- no heparin- hgb trending better  # Atrial fibrillation - s/p DCCV on 12/16. On amiodarone.   # VDRF- per CHF/PCCMsvc. Trach/peg 1/10  [place]-  Phos low but cannot  replete with sodium phos-   sodium phos  PRN, will give today   Subjective: Seen and examined in ICU.    Running CRRT.  On vent.  Remains critically ill without any new event.  I/Os 3.4 / 4.5.  Discussed with RN-   Objective Vital signs in last 24 hours: Vitals:   02/29/2020 0400  0422 03/06/2020 0500 03/18/2020 0600  BP: 109/74  105/76 93/70  Pulse: 88  96 84  Resp: (!) 25  (!) 25 19  Temp: 97.9 F (36.6 C)     TempSrc: Axillary     SpO2: 100%  100% 100%  Weight:  83.7 kg    Height:       Weight change: 2.1 kg  Intake/Output Summary (Last 24 hours) at 03/12/2020 0704 Last data filed at 03/26/2020 0600 Gross per 24 hour  Intake 3434.66 ml  Output 4521 ml  Net -1086.34 ml    Labs: Basic Metabolic Panel: Recent Labs  Lab 02/29/20 0322 02/29/20 1612 02/29/2020 0425  NA 132* 131* 134*  K 4.4 5.1 4.2  CL 99 98 99  CO2 '25 24 25  ' GLUCOSE 139* 204* 97  BUN 44* 45* 41*  CREATININE 0.92 0.86 0.85  CALCIUM 8.1* 8.2* 8.6*  PHOS 1.5* 3.8 2.0*   Liver Function Tests: Recent Labs  Lab 02/29/20 0322 02/29/20 1612 03/16/2020 0425  ALBUMIN 2.1* 2.1* 2.0*   No results for input(s): LIPASE, AMYLASE in the last 168 hours. No results for input(s): AMMONIA in  the last 168 hours. CBC: Recent Labs  Lab 02/26/20 0402 02/27/20 0436 02/28/20 0332 02/29/20 0322 03/15/2020 0425  WBC 15.5* 15.3* 12.6* 17.0* 14.8*  NEUTROABS 12.9* 12.6* 10.2* 14.3* 12.0*  HGB 8.9* 8.9* 9.1* 9.7* 10.0*  HCT 31.0* 31.4* 32.0* 33.8* 34.3*  MCV 93.4 93.2 93.6 92.3 91.7  PLT 321 339 336 394 390   Cardiac Enzymes: No results for input(s): CKTOTAL, CKMB, CKMBINDEX, TROPONINI in the last 168 hours. CBG: Recent Labs  Lab 02/29/20 1138 02/29/20 1557 02/29/20 2030 03/23/2020 0101 02/29/2020 0417  GLUCAP 201* 187* 129* 164* 108*    Iron Studies: No results for input(s): IRON, TIBC, TRANSFERRIN, FERRITIN in the last 72 hours. Studies/Results: DG Chest 1 View  Result Date: 03/03/2020 CLINICAL DATA:   Tracheostomy.  Open-heart surgery. EXAM: CHEST  1 VIEW COMPARISON:  02/28/2020.  CT 02/12/2020. FINDINGS: Tracheostomy tube and right PICC line and stable position. Cardiac pacer in stable position. Stable cardiomegaly. Low lung volumes with bibasilar atelectasis. Bibasilar infiltrates cannot be excluded. No prominent pleural effusion. Left costophrenic angle incompletely imaged. Stable right apical pleural based density, possibly loculated fluid again noted. No pneumothorax. Surgical staples noted over the right chest IMPRESSION: 1. Tracheostomy tube and right PICC line in stable position. 2. Cardiac pacer in stable position. Stable cardiomegaly. 3. Low lung volumes with bibasilar atelectasis. Bibasilar infiltrates cannot be excluded. Stable right apical pleural based density loculated pleural effusion. No pneumothorax. Electronically Signed   By: Marcello Moores  Register   On:  06:05    Medications: Infusions: .  prismasol BGK 4/2.5 500 mL/hr at 02/29/20 1645  .  prismasol BGK 4/2.5 200 mL/hr at 02/28/20 2135  . sodium chloride Stopped (02/29/20 1503)  . sodium chloride    . dexmedetomidine (PRECEDEX) IV infusion 0.8 mcg/kg/hr (03/24/2020 0600)  . dextrose 40 mL/hr at 03/23/2020 0100  . DOBUTamine 4 mcg/kg/min (03/12/2020 0600)  . feeding supplement (PIVOT 1.5 CAL) 75 mL/hr at 03/17/2020 0000  . heparin 1,750 Units/hr ( 0600)  . lactated ringers Stopped (02/29/20 0835)  . prismasol BGK 4/2.5 1,500 mL/hr at 02/29/20 1641    Scheduled Medications: . sodium chloride   Intravenous Once  . amiodarone  200 mg Per Tube BID  . vitamin C  500 mg Per Tube TID  . aspirin  324 mg Per Tube Daily  . atorvastatin  80 mg Per Tube Daily  . busPIRone  10 mg Per Tube TID  . chlorhexidine gluconate (MEDLINE KIT)  15 mL Mouth Rinse BID  . Chlorhexidine Gluconate Cloth  6 each Topical Daily  . clonazepam  1 mg Oral QHS  . darbepoetin (ARANESP) injection - NON-DIALYSIS  100 mcg Subcutaneous Q Sat-1800  .  feeding supplement (PROSource TF)  45 mL Per Tube TID  . fiber  1 packet Per Tube BID  . FLUoxetine  40 mg Per Tube BID  . HYDROmorphone  4 mg Per Tube Q6H  . insulin aspart  0-9 Units Subcutaneous Q4H  . insulin aspart  4 Units Subcutaneous Q4H  . insulin detemir  12 Units Subcutaneous Q12H  . LORazepam  0.5 mg Intravenous Q4H  . mouth rinse  15 mL Mouth Rinse 10 times per day  . melatonin  5 mg Per Tube Daily  . mexiletine  150 mg Per Tube Q8H  . midodrine  20 mg Per Tube TID WC  . multivitamin  1 tablet Per Tube QHS  . nutrition supplement (JUVEN)  1 packet Per Tube BID BM  . pantoprazole (PROTONIX)  IV  40 mg Intravenous Q12H  . sodium chloride flush  10-40 mL Intracatheter Q12H  . thiamine  100 mg Per Tube Daily  . valproic acid  250 mg Per Tube BID    have reviewed scheduled and prn medications.  Physical Exam: General: Critically ill looking male sedated and on vent via trach.  Heart:RRR, s1s2 nl Lungs: Coarse breath sound bilateral Abdomen:soft, Non-tender, non-distended Extremities really minimal edema  Dialysis Access:  right femoral catheter site clean.  In for 11 days   Louis Meckel 03/24/2020,7:04 AM  LOS: 62 days

## 2020-03-01 NOTE — Op Note (Signed)
Procedure(s): Removal of Right axillary Impella LVAD  Corey Palmer male 50 y.o. 01/30/2020 Procedure(s) and Anesthesia Type:    Removal of right axillary artery impella 5.5 LVAD  Surgeon(s) and Role:    Besan Ketchem, B. Zane   Indications: The patient is status post high risk CABG with perioperative use of an Impella LVAD.  He has apparently recovered LV function to the point where the LVAD can be removed.  He is taken to the operating room for this today.     Surgeon: [name]   Assistants: Staff  Anesthesia: General endotracheal anesthesia  ASA Class: 4    Procedure Detail  Removal of right axillary artery Impella 5.5 LVAD After informed consent, patient was taken the operating room on the above listed date.  He is placed in the supine position on the operating room table.  Anesthesia was confirmed by general endotracheal technique.  The anterior chest was cleansed and draped in a sterile field using Betadine solution.  A preop surgical pause was performed.  The prior right axillary artery incision was reopened.  TEE was performed.  The Impella LVAD was pulled back across the aortic valve under TEE guidance.  The pump was then turned off.  This was then further withdrawn to remove it through the dacryon graft which was sewn was to the side of the axillary artery.  The graft itself was clamped.  Pump was passed off the field.  The graft was trimmed to approximate 1 cm above the level of the anastomosis and then the stump was oversewn.  The wound was copiously irrigated.  A drain was left in the space.  The wound was closed in layers.  Staples were used to reapproximate the skin.  Sterile dressings were applied.  All sponge instrument and needle counts were correct.  Estimated Blood Loss:  Minimal         Drains: 10 Fr JP          Blood Given: none          Specimens: none         Implants: none        Complications:  * No complications entered in OR log *          Disposition: ICU - intubated and critically ill.         Condition: stable

## 2020-03-01 NOTE — Progress Notes (Signed)
This chaplain is present with the Pt. wife-[name] for F/U spiritual care in the waiting area.    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   The chaplain listens to [facility] updates on the Pt. With the use of story telling, Corey Palmer identifies the Pt. "stubbornness" and how the Pt. may not have shared the extent of his illness with Corey Palmer. The chaplain reframed [facility] statement into the question, "what do you think the Pt. is telling you now?"  Corey Palmer re-visits her belief that God has not given up on the Pt. and how her hope is helping her cope minute by minute.  Corey Palmer accepted the chaplain's invitation for F/U spiritual care as needed.

## 2020-03-01 NOTE — Progress Notes (Signed)
Paged Moshe Cipro MD in regards to giving potassium phosphate. In the past, patient's potassium level has risen when patient goes to OR and is off CRRT for periods of time. Ordered to stop potassium phosphate infusion.  Paged Dillingham MD in regards to when to stop heparin gtt prior to surgery. Ordered to speak with other providers, and if it's okay with them to stop it now, then do that. Per Aundra Dubin MD, okay to stop heparin gtt now because patient only on heparin for a fib. Patient currently in NSR.

## 2020-03-01 NOTE — Anesthesia Postprocedure Evaluation (Signed)
Anesthesia Post Note  Patient: Corey Palmer  Procedure(s) Performed: Sternal wound debridement (N/A Chest) APPLICATION OF A-CELL OR MATRIDERM (N/A Chest) APPLICATION OF WOUND VAC (N/A Chest)     Patient location during evaluation: ICU Level of consciousness: patient remains intubated per anesthesia plan Pain management: pain level controlled Vital Signs Assessment: post-procedure vital signs reviewed and stable Cardiovascular status: stable Postop Assessment: no apparent nausea or vomiting Anesthetic complications: no   No complications documented.  Last Vitals:  Vitals:   03/17/2020 1632 03/16/2020 1700  BP:  94/65  Pulse: 86 84  Resp: (!) 30 19  Temp:    SpO2: 100% 100%    Last Pain:  Vitals:   03/07/2020 1430  TempSrc: Axillary  PainSc:                  [name]

## 2020-03-01 NOTE — Progress Notes (Signed)
NAME:  Corey Palmer, MRN:  [id number], DOB:  1970/03/20, LOS: 51 ADMISSION DATE:  01/21/2020, CONSULTATION DATE: 01/07/2020 REFERRING MD: Aundra Dubin CHIEF COMPLAINT: Respiratory failure post Impella  HPI/Course in hospital  50 year old man admitted 12/1 with A. fib/RVR, torsades, cardiogenic shock [EF 15] requiring Impella placement (removed 1/21), CABG x 4 c/b cardiac tamponade requiring chest reopening (now with VAC), AKI requiring on CRRT.  Past Medical History:    has a past medical history of Anginal pain ([place]), Anxiety, Arthritis, Automatic implantable cardioverter-defibrillator in situ (2007), Bipolar disorder ([place]), CAD (coronary artery disease), Cancer ([place]) (2013), CHF (congestive heart failure) ([place]), Chondrocalcinosis of right knee (2/50/0370), Chronic systolic heart failure ([place]), Depression, Diabetes uncomplicated adult-type II, Dilated cardiomyopathy ([place]), Dyslipidemia, Dysrhythmia, GERD (gastroesophageal reflux disease), Gout, unspecified, Hepatic steatosis (2011), History of kidney stones, echocardiogram, Hypertension, Obesity (BMI 30.0-34.9), Pacemaker, Paroxysmal atrial fibrillation ([place]), Peptic ulcer, Shortness of breath, and Sleep apnea.  Significant Hospital Events:  12/2 Afib with RVR. Cardioverted in ED. AICD did not fire. Milrinone for low co ox.  12/7 ICD fired, Torsades- likely from low K of 2.8.  12/8 Cath- multivessel disease w/ low CO. Volume overloaded.  12/11 Underwent Impella 5.5 insertion for persistent symptoms of forward failure with low cardiac indices. 12/12 Back to the OR for displacement of Impella. Extubated.  12/15 CABG x4, with LIMA-LAD, SVG-PDA/PLV, SVG-OM 12/16 Emergently opened chest at bedside for tamponade, clot compressing right atrium  12/16 To OR for Exploration of chest due to bleeding  12/19 Started CRRT 12/23 Sternal Closure in OR 12/24 Worsening shock.  Chest reopened at bedside with improvement in hemodynamics.  No mediastinal  hematoma. 12/25 A flutter > rapid a paced to sinus rhythm.  Remains on high-dose pressors, inotropes 12/28 Weaning vasopressor requirements.  For chest washout today.  Back in atrial fibrillation, refractory to increased amiodarone and attempted overdrive pacing.  DCCV to NSR. Tolerating fluid removal via CRRT. 1/03 Appears euvolemic.  Adjusting glycemic control.  Chest still open so not weaning 1/04 Spiking fever, Vanco meropenem resumed after culture sent from blood and sputum. 1/05 Awaiting return to the OR for wound VAC dressing assessment fever curve and white blood cell count improving after antibiotics resumed the day prior, did have chest closed, developed tamponade physiology shortly after had to go back for emergent reopening of chest, return to intensive care and refractory shock 1/06 Wound VAC dressing replaced.  Still in shock.  Starting to wean sedation 1/07 Still on high-dose pressors, CRRT 1/15 Recurrent Vtach, shocked 1/18 Attempted pressure change to P 3 with addition of Dobutamine . MAP goal > 60 1/19 Ongoing attempts at impella wean, on PS 1/20 Stable on vent, following commands with sedation weaned, continued Impella wean attempt 1/21 Impella removed, on FloTrac, WBC 22K, pancultured, Cefepime/Vanc started empirically for low SVR, concern for sepsis 1/22 antifungals added Impella removed on 1/21, 3 PM CT x3 in place 1/27 OR with plastics for debridement, ACell, partial closure 1/28 plan to stop CRRT when clots off 1/29 CRRT didn't clot off, continues on 1/31 CRRT ongoing, tolerated PSV 16/8 for several hours.    Consults:  PCCM, nephrology, cardiology, cardiothoracic surgery, EP  Procedures:  R PICC 12/2 >> R axillary Impella 5.5 12/11 >> 1/21 ETT 12/15 >> 1/10 R femoral arterial sheath 12/15 >> 12/31 R femoral HD cath 12/19 >> 12/31 Chest tube - right pleural, left pleural , mediastinal 12/23 >> 1/5 R Radial A-Line 1/6 >> R fem HD > Chest tube - L pleural,  R  pleural, mediastinal 1/10 >> 1/30 Trach 1/10 > PEG 1/10 >  Significant Diagnostic Tests:  12/8 LHC  Elevated R and L heart filling pressures with low CO, severe 3-vessel disease 1/16 echo > Severe biventricular failure. No significant effusion. Pacing wire in  RV. Markedly abnormal septal motion. Micro Data:  1/19 BCx2 -no growth at 3 days 1/21 OR Cx > staph epi  Antimicrobials:  Levaquin 1/21 (periop ppx) Meropenem 1/21>1/29 Vanc 1/21 >  Anidulafungin 1/22>  1/29  Interval history:  Tolerated pressure support for short-term on 02/29/2020.  He is for possible sternal closure today 03/01/2018  Objective   Blood pressure 104/73, pulse 87, temperature (!) 97.3 F (36.3 C), temperature source Axillary, resp. rate 19, height 6\' 3"  (1.905 m), weight 83.7 kg, SpO2 97 %.    Vent Mode: PSV;CPAP FiO2 (%):  [30 %] 30 % Set Rate:  [24 bmp] 24 bmp Vt Set:  [510 mL] 510 mL PEEP:  [5 cmH20] 5 cmH20 Pressure Support:  [14 cmH20] 14 cmH20   Intake/Output Summary (Last 24 hours) at 03/22/2020 0848 Last data filed at 03/24/2020 0800 Gross per 24 hour  Intake 3344.11 ml  Output 4362 ml  Net -1017.89 ml   Filed Weights   02/28/20 0500 02/29/20 0500 03/25/2020 0422  Weight: 84.6 kg 81.6 kg 83.7 kg       Examination:   General: 50 year old male who looks much older than stated age 9: Tracheostomy connected to ventilator unremarkable Neuro: Follows commands moves all extremities CV: Heart sounds are distant PULM: Creased breath sounds in the bases Vent pressure support FIO2 30% PEEP 5 RATE 25 VT approximately 500  GI: soft, bsx4 active  Extremities: warm/dry, 1+ edema  Skin: no rashes or lesions   Assessment & Plan:   Acute hypoxic respiratory failure requiring MV, s/p trach 1/10 Possible HCAP s/p tx with meropenem/eraxis Small R pleural effusion, persistent  P Pressor support as tolerated As were possible sternal closing 03/24/2020 Wean as able Creased sedation once he is  completed surgical closure Continue IV Precedex Dilaudid Ativan MPR visit as needed Once back from the OR and wound closure more aggressive weaning narcotics Pulmonary toilet      Cardiogenic shock -s/p impella, removed 1/21 -s/p CABG c/b tamponade and multiple chest re-openings, prolonged open chest  -s/p excision/debridement + ACell application, partial closure with plastics 1/27 P Per  advanced heart failure team and thoracic surgery Dobutamine, midodrine Monitor culture data and fever curve   MRSE sternal wound infection Vancomycin Wound VAC changes as needed  Afib RVR, recurrent VT, Torsades -- improved  P Amiodarone Telemetry  Acute renal failure in CKD IIIa requiring RRT Cardiorenal syndrome  Lab Results  Component Value Date   CREATININE 0.85 02/29/2020   CREATININE 0.86 02/29/2020   CREATININE 0.92 02/29/2020   CREATININE 1.13 05/13/2013   CREATININE 0.88 08/03/2010     P Appreciate nephrology's input Will probably transition to intermittent hemodialysis on 03/13/2020 If unable to tolerate intermittent hemodialysis for discussion of goals of care may be needed   Hyponatremia Recent Labs  Lab 02/29/20 0322 02/29/20 1612 03/11/2020 0425  NA 132* 131* 134*    Per renal Currently on CRRT will transition to intermittent hemodialysis within 24 hours  Anemia Recent Labs    02/29/20 0322 03/06/2020 0425  HGB 9.7* 10.0*    She is to protocol  DM with hyperglycemia CBG (last 3)  Recent Labs    02/29/20 2030 02/29/2020 0101 03/16/2020 [place]  129* 164* 108*    Sliding-scale insulin protocol  Deconditioning Dysphagia At risk malnutrition  Dietitian following Physical therapy and Occupational Therapy Continue to monitor   - Dietician following - PT/OT - fiber, lomotil   Daily Goals Checklist  Pain/Anxiety/Delirium protocol (if indicated): Precedex, weaning as able VAP protocol (if indicated): yes DVT prophylaxis:  Heparin  gtt Nutrition Status: TF GI prophylaxis: Pantoprazole Glucose control: Insulin (Basal, Standing, SSI) Mobility/therapy needs: per primary Code Status: Full  Family Communication: per primary-- palliative also updating  Disposition: ICU  Critical care time 35 mins  Richardson Landry Tya Haughey ACNP Acute Care Nurse Practitioner [place] Please consult Amion 02/29/2020, 8:48 AM

## 2020-03-01 NOTE — Anesthesia Preprocedure Evaluation (Signed)
Anesthesia Evaluation  Patient identified by MRN, date of birth, ID band Patient awake  Preop documentation limited or incomplete due to emergent nature of procedure.  Airway Mallampati: Intubated       Dental   Pulmonary shortness of breath, sleep apnea ,    breath sounds clear to auscultation       Cardiovascular hypertension, + angina + CAD and +CHF  + dysrhythmias + pacemaker + Cardiac Defibrillator  Rhythm:Regular Rate:Normal     Neuro/Psych    GI/Hepatic PUD, GERD  ,  Endo/Other  diabetes  Renal/GU Renal disease     Musculoskeletal   Abdominal   Peds  Hematology   Anesthesia Other Findings   Reproductive/Obstetrics                             Anesthesia Physical Anesthesia Plan  ASA: III  Anesthesia Plan: General   Post-op Pain Management:    Induction: Intravenous  PONV Risk Score and Plan: 2 and Treatment may vary due to age or medical condition  Airway Management Planned: Oral ETT  Additional Equipment:   Intra-op Plan:   Post-operative Plan: Post-operative intubation/ventilation  Informed Consent:   Plan Discussed with: CRNA, Anesthesiologist and Surgeon  Anesthesia Plan Comments:         Anesthesia Quick Evaluation

## 2020-03-01 NOTE — Progress Notes (Signed)
Patient ID: Corey Palmer, male   DOB: November 06, 1970, 50 y.o.   MRN: [id number]     Advanced Heart Failure Rounding Note  PCP-Cardiologist: No primary care provider on file.   Subjective:    - 12/7 Torsades -ICD shock x1.  - 12/11 Impella 5.5 placed.  - 12/12 Impella repositioned in OR - 12/15 CABG x 4 with LIMA-LAD, SVG-PDA/PLV, SVG-OM - 12/16 DCCV afib.  Back to OR to re-open chest and again to evacuate hematoma.  - 12/19 CVVH initiated  - 12/23 Chest closed - 12/24 Chest reopened - 12/26 RAP back into NSR - 12/27 back in atrial fibrillation, unable to rapid atrial pace out.   - 12/28 To OR, chest partially closed and wound vac placed.  DCCV to NSR.  - 12/31 Antibiotics stopped. Swan removed, HD catheter moved from right femoral to right IJ.  - 1/5 To OR for chest closure. Chest reopened emergently at bedside again to evacuate hematoma  - 1/10 To OR: omental flap closure of chest with wound vac, tracheostomy, G-tube, J-tube.    - 1/12 Plastics and EP consulted.  - 1/13 Palliative Care consulted.  - 1/15 Went into VT, Shock 200 J and amiodarone bolus 150.   - 1/16: Milrinone stopped.    - 1/17: Partial closure of chest in OR - 1/18 Switched to dobutamine 4 mcg - 1/21 Impella Extracted.  Developed hyperkalemia when CVVH held with wide QRS rhythm and required transient external pacing.   - 1/27 OR for wound vac change, partial chest wall closure.   Co-ox 60% on dobutamine 3, off pressors. CVP 6. Remains on CVVHD, running even to mildly negative. On vent with trach, has been doing PSV trials.   Wakes to verbal stimuli, able to follow commands.    Objective:   Weight Range: 83.7 kg Body mass index is 23.06 kg/m.   Vital Signs:   Temp:  [97.3 F (36.3 C)-98.4 F (36.9 C)] 97.3 F (36.3 C) (02/02 0800) Pulse Rate:  [77-103] 87 (02/02 0806) Resp:  [17-30] 19 (02/02 0806) BP: (79-116)/(56-77) 104/73 (02/02 0800) SpO2:  [95 %-100 %] 97 % (02/02 0806) FiO2 (%):  [30 %] 30 % (02/02  0806) Weight:  [83.7 kg] 83.7 kg (02/02 0422) Last BM Date: 03/18/2020  Weight change: Filed Weights   02/28/20 0500 02/29/20 0500 03/07/2020 0422  Weight: 84.6 kg 81.6 kg 83.7 kg    Intake/Output:   Intake/Output Summary (Last 24 hours) at 03/21/2020 0845 Last data filed at 03/06/2020 0800 Gross per 24 hour  Intake 3344.11 ml  Output 4362 ml  Net -1017.89 ml      Physical Exam   CVP 6 General: NAD Neck: Tracheostomy. No JVD, no thyromegaly or thyroid nodule.  Lungs: Mild crackles at bases.  CV: Nondisplaced PMI.  Heart regular S1/S2, no S3/S4, no murmur.  No peripheral edema.   Abdomen: Soft, nontender, no hepatosplenomegaly, no distention.  Skin: Intact without lesions or rashes.  Neurologic: Will wake up and follow commands.  Extremities: No clubbing or cyanosis.  HEENT: Normal.    Telemetry   SR 80s.  Personally reviewed.   Labs    CBC Recent Labs    02/29/20 0322 03/05/2020 0425  WBC 17.0* 14.8*  NEUTROABS 14.3* 12.0*  HGB 9.7* 10.0*  HCT 33.8* 34.3*  MCV 92.3 91.7  PLT 394 016   Basic Metabolic Panel Recent Labs    02/29/20 0322 02/29/20 1612 03/13/2020 0425  NA 132* 131* 134*  K 4.4 5.1 4.2  CL 99 98 99  CO2 '25 24 25  ' GLUCOSE 139* 204* 97  BUN 44* 45* 41*  CREATININE 0.92 0.86 0.85  CALCIUM 8.1* 8.2* 8.6*  MG 2.4  --  2.5*  PHOS 1.5* 3.8 2.0*   Liver Function Tests Recent Labs    02/29/20 1612 03/21/2020 0425  ALBUMIN 2.1* 2.0*   No results for input(s): LIPASE, AMYLASE in the last 72 hours. Cardiac Enzymes No results for input(s): CKTOTAL, CKMB, CKMBINDEX, TROPONINI in the last 72 hours.  BNP: BNP (last 3 results) Recent Labs    01/11/2020 1619  BNP 577.5*    ProBNP (last 3 results) No results for input(s): PROBNP in the last 8760 hours.   D-Dimer No results for input(s): DDIMER in the last 72 hours. Hemoglobin A1C No results for input(s): HGBA1C in the last 72 hours. Fasting Lipid Panel No results for input(s): CHOL, HDL,  LDLCALC, TRIG, CHOLHDL, LDLDIRECT in the last 72 hours. Thyroid Function Tests No results for input(s): TSH, T4TOTAL, T3FREE, THYROIDAB in the last 72 hours.  Invalid input(s): FREET3  Other results:   Imaging    DG Chest 1 View  Result Date: 03/24/2020 CLINICAL DATA:  Tracheostomy.  Open-heart surgery. EXAM: CHEST  1 VIEW COMPARISON:  02/28/2020.  CT 02/12/2020. FINDINGS: Tracheostomy tube and right PICC line and stable position. Cardiac pacer in stable position. Stable cardiomegaly. Low lung volumes with bibasilar atelectasis. Bibasilar infiltrates cannot be excluded. No prominent pleural effusion. Left costophrenic angle incompletely imaged. Stable right apical pleural based density, possibly loculated fluid again noted. No pneumothorax. Surgical staples noted over the right chest IMPRESSION: 1. Tracheostomy tube and right PICC line in stable position. 2. Cardiac pacer in stable position. Stable cardiomegaly. 3. Low lung volumes with bibasilar atelectasis. Bibasilar infiltrates cannot be excluded. Stable right apical pleural based density loculated pleural effusion. No pneumothorax. Electronically Signed   By: Marcello Moores  Register   On: 03/06/2020 06:05     Medications:     Scheduled Medications: . sodium chloride   Intravenous Once  . amiodarone  200 mg Per Tube BID  . vitamin C  500 mg Per Tube TID  . aspirin  324 mg Per Tube Daily  . atorvastatin  80 mg Per Tube Daily  . busPIRone  10 mg Per Tube TID  . chlorhexidine gluconate (MEDLINE KIT)  15 mL Mouth Rinse BID  . Chlorhexidine Gluconate Cloth  6 each Topical Daily  . clonazepam  1 mg Oral QHS  . darbepoetin (ARANESP) injection - NON-DIALYSIS  100 mcg Subcutaneous Q Sat-1800  . feeding supplement (PROSource TF)  45 mL Per Tube TID  . fiber  1 packet Per Tube BID  . FLUoxetine  40 mg Per Tube BID  . HYDROmorphone  4 mg Per Tube Q6H  . insulin aspart  0-9 Units Subcutaneous Q4H  . insulin aspart  4 Units Subcutaneous Q4H  .  insulin detemir  12 Units Subcutaneous Q12H  . LORazepam  0.5 mg Intravenous Q4H  . mouth rinse  15 mL Mouth Rinse 10 times per day  . melatonin  5 mg Per Tube Daily  . mexiletine  150 mg Per Tube Q8H  . midodrine  20 mg Per Tube TID WC  . multivitamin  1 tablet Per Tube QHS  . nutrition supplement (JUVEN)  1 packet Per Tube BID BM  . pantoprazole (PROTONIX) IV  40 mg Intravenous Q12H  . sodium chloride flush  10-40 mL Intracatheter Q12H  . thiamine  100  mg Per Tube Daily  . valproic acid  250 mg Per Tube BID    Infusions: .  prismasol BGK 4/2.5 500 mL/hr at 02/29/20 1645  .  prismasol BGK 4/2.5 200 mL/hr at 02/28/20 2135  . sodium chloride Stopped (03/22/2020 0753)  . sodium chloride    . dexmedetomidine (PRECEDEX) IV infusion 0.8 mcg/kg/hr (03/22/2020 0800)  . dextrose 40 mL/hr at 03/03/2020 0100  . DOBUTamine 4 mcg/kg/min (03/16/2020 0800)  . feeding supplement (PIVOT 1.5 CAL) 75 mL/hr at 03/04/2020 0000  . heparin 1,750 Units/hr (03/15/2020 0800)  . lactated ringers Stopped (02/29/20 0835)  . prismasol BGK 4/2.5 1,500 mL/hr at 02/29/20 1641    PRN Medications: sodium chloride, Place/Maintain arterial line **AND** sodium chloride, artificial tears, bisacodyl **OR** bisacodyl, dextrose, docusate, heparin, heparin, lip balm, loperamide HCl, metoprolol tartrate, midazolam, nystatin, polyethylene glycol, sodium chloride flush     Assessment/Plan   1. Cardiogenic shock/acute on chronic systolic CHF: Initially nonischemic cardiomyopathy.  Possible familial cardiomyopathy as both parents had cardiomyopathy and died at around 35. However, [employer] gene testing did not show any common mutation for cardiomyopathy.  However, this admission noted to have severe 3 vessel disease so suspect component of ischemic cardiomyopathy.  St Jude ICD.  Echo in 8/20 with EF 15% and mildly decreased RV function. Echo this admission with EF < 20%, moderate LV dilation, RV mildly reduced, severe LAE, no significant  MR. Low output HF with markedly low EF.  He has a long history of cardiomyopathy (20 yrs), tends to minimize symptoms.  Cardiorenal syndrome with creatinine up to 2.2,  stabilized on milrinone and Impella 5.5. SCr 1.44 day of CABG. CABG 12/15.  Post-op shock, back to OR 12/16 to open chest (chest wall compartment syndrome) and again to evacuate hematoma.  TEE post-op with severe RV dysfunction.  CVVHD initiated 12/19 for volume removal in setting of rising Scr/decreased UOP. Suspect ATN. S/p chest closure 12/23. Hemodynamics much worse on 12/24 with rising pressor demands. Co-ox 36%.  Bedside echo severe biventricular dysfunction with marked septal bounce. Chest re-opened 12/24.  Was atrial paced back to NSR on 12/26 with improvement in hemodynamics but back in atrial fibrillation on 12/27 and unable to pace out, DCCV to NSR on 12/28. Went back to OR 1/5 for chest closure but several hours later required emergent re-opening of chest at bedside to evacuate hematoma. To OR again 1/10 with closure of chest with omental flap with G & J tube.  1/17 partial closure. 1/21 Impella removed.  Off CVVH post-removal, he developed hyperkalemia and wide QRS rhythm requiring transient external pacing, also with suspected septic shock (low SVR, fever, rising WBCs).  Now off pressors.  Co-ox 60% on dobutamine 4.  CVP 6.   - Will run CVVH even today. Will need to transition to [facility] after today's surgery. He will need to show that he can tolerate iHD to go to LTAC. If he cannot, will need to start thinking about comfort care.  - Decrease dobutamine to 3.  - Continues on midodrine 20 TID.  - Back to OR today for wound vac change and further chest closure.  - Not candidate to replace Impella (at this point, not candidate for LVAD or transplant).  2. Atrial fibrillation: H/o PAF.  He was on dofetilide in the remote past but this was stopped due to noncompliance.  He had an upper GI bleed from antral ulcers in 3/12. He was seen by GI  and was deemed safe to restart anticoagulation  as long as he remains on a PPI.  No apparent recurrence of AF until just prior to this admission, was cardioverted back to NSR in ER but went back into atrial fibrillation.  Post-op afib, DCCV to NSR/BiV pacing am 12/16 -> back to AF. Rapid atrial pacing back to SR. Remains in NSR - Continue amiodarone per tube 200 mg bid.  - Continue heparin gtt, transition to Eliquis when surgeries are completed.   3. CAD:   Cath this admission with severe 3 vessel disease. CABG x 4 on 12/15.  - Continue atorvastatin, ASA.  4. Acute on chronic hypoxemic respiratory failure: Vent per CCM.  Has tracheostomy. Has been getting PSV trials. - Trial trach collar after OR.   5. AKI on CKD stage 3: Likely ATN/cardiorenal. Anuric. On CVVH.    - Attempt transition to iHD probably tomorrow (OR today).  6. Diabetes: Insulin.  7. Hyponatremia: Resolved with CVVH.  8. Torsades/ICD shock: 12/7/212 in hospital event, in setting of severe hypokalemia and hypomagnesemia.   9. Gout: h/o gout. Complained of rt knee pain c/w previous flares - treated w/ prednisone burst (completed).  10: ID: Septic shock 1/21 with fever, low SVR, and elevated WBCs. Cultures no growth with exception of axillary cx on 1/21 grew staph epidermidis.  IJ catheter removed. Completed hydrocortisone.  - Off abx. 11. FEN: c/w TFs.  12. Anemia: Hgb stable at 10  today.  Transfuse < 7.5.   13. Stage II Pressure Ulcer- sacrum: Continue to reposition every 2 hours R/L  14. Pleural Effusion: Loculated rt apical pleural effusion noted on imaging. Continue UF through CVVH.  PCCM following  15. VT: Amiodarone + mexiletine.   16. Deconditioning: Profound. Work with PT, nursing to work on range of motion.   Have asked social work to look into LTAC options.  Will have trach, HD, and dobutamine.    CRITICAL CARE Performed by: Loralie Champagne  Total critical care time: 35 minutes  Critical care time was exclusive of  separately billable procedures and treating other patients.  Critical care was necessary to treat or prevent imminent or life-threatening deterioration.  Critical care was time spent personally by me on the following activities: development of treatment plan with patient and/or surrogate as well as nursing, discussions with consultants, evaluation of patient's response to treatment, examination of patient, obtaining history from patient or surrogate, ordering and performing treatments and interventions, ordering and review of laboratory studies, ordering and review of radiographic studies, pulse oximetry and re-evaluation of patient's condition.   Loralie Champagne 03/27/2020 8:45 AM

## 2020-03-02 ENCOUNTER — Inpatient Hospital Stay (HOSPITAL_COMMUNITY): Payer: BC Managed Care – PPO | Admitting: Anesthesiology

## 2020-03-02 ENCOUNTER — Encounter (HOSPITAL_COMMUNITY): Payer: Self-pay | Admitting: Cardiothoracic Surgery

## 2020-03-02 ENCOUNTER — Encounter (HOSPITAL_COMMUNITY): Admission: EM | Disposition: E | Payer: Self-pay | Source: Home / Self Care | Attending: Cardiothoracic Surgery

## 2020-03-02 ENCOUNTER — Inpatient Hospital Stay (HOSPITAL_COMMUNITY): Payer: BC Managed Care – PPO

## 2020-03-02 DIAGNOSIS — J9601 Acute respiratory failure with hypoxia: Secondary | ICD-10-CM | POA: Diagnosis not present

## 2020-03-02 DIAGNOSIS — M9689 Other intraoperative and postprocedural complications and disorders of the musculoskeletal system: Secondary | ICD-10-CM

## 2020-03-02 DIAGNOSIS — R570 Cardiogenic shock: Secondary | ICD-10-CM | POA: Diagnosis not present

## 2020-03-02 HISTORY — PX: STERNAL WOUND DEBRIDEMENT: SHX1058

## 2020-03-02 HISTORY — PX: APPLICATION OF WOUND VAC: SHX5189

## 2020-03-02 HISTORY — PX: WOUND EXPLORATION: SHX6188

## 2020-03-02 LAB — CBC WITH DIFFERENTIAL/PLATELET
Abs Immature Granulocytes: 0.14 10*3/uL — ABNORMAL HIGH (ref 0.00–0.07)
Basophils Absolute: 0 10*3/uL (ref 0.0–0.1)
Basophils Relative: 0 %
Eosinophils Absolute: 0.2 10*3/uL (ref 0.0–0.5)
Eosinophils Relative: 1 %
HCT: 32.7 % — ABNORMAL LOW (ref 39.0–52.0)
Hemoglobin: 9.5 g/dL — ABNORMAL LOW (ref 13.0–17.0)
Immature Granulocytes: 1 %
Lymphocytes Relative: 7 %
Lymphs Abs: 1.1 10*3/uL (ref 0.7–4.0)
MCH: 26.6 pg (ref 26.0–34.0)
MCHC: 29.1 g/dL — ABNORMAL LOW (ref 30.0–36.0)
MCV: 91.6 fL (ref 80.0–100.0)
Monocytes Absolute: 1.3 10*3/uL — ABNORMAL HIGH (ref 0.1–1.0)
Monocytes Relative: 9 %
Neutro Abs: 12.5 10*3/uL — ABNORMAL HIGH (ref 1.7–7.7)
Neutrophils Relative %: 82 %
Platelets: 411 10*3/uL — ABNORMAL HIGH (ref 150–400)
RBC: 3.57 MIL/uL — ABNORMAL LOW (ref 4.22–5.81)
RDW: 19.7 % — ABNORMAL HIGH (ref 11.5–15.5)
WBC: 15.2 10*3/uL — ABNORMAL HIGH (ref 4.0–10.5)
nRBC: 0 % (ref 0.0–0.2)

## 2020-03-02 LAB — POCT I-STAT 7, (LYTES, BLD GAS, ICA,H+H)
Acid-Base Excess: 2 mmol/L (ref 0.0–2.0)
Bicarbonate: 27.3 mmol/L (ref 20.0–28.0)
Calcium, Ion: 1.16 mmol/L (ref 1.15–1.40)
HCT: 28 % — ABNORMAL LOW (ref 39.0–52.0)
Hemoglobin: 9.5 g/dL — ABNORMAL LOW (ref 13.0–17.0)
O2 Saturation: 100 %
Potassium: 4.9 mmol/L (ref 3.5–5.1)
Sodium: 135 mmol/L (ref 135–145)
TCO2: 29 mmol/L (ref 22–32)
pCO2 arterial: 47.3 mmHg (ref 32.0–48.0)
pH, Arterial: 7.37 (ref 7.350–7.450)
pO2, Arterial: 475 mmHg — ABNORMAL HIGH (ref 83.0–108.0)

## 2020-03-02 LAB — COOXEMETRY PANEL
Carboxyhemoglobin: 2 % — ABNORMAL HIGH (ref 0.5–1.5)
Methemoglobin: 0.7 % (ref 0.0–1.5)
O2 Saturation: 59.7 %
Total hemoglobin: 10.2 g/dL — ABNORMAL LOW (ref 12.0–16.0)

## 2020-03-02 LAB — RENAL FUNCTION PANEL
Albumin: 1.9 g/dL — ABNORMAL LOW (ref 3.5–5.0)
Albumin: 1.9 g/dL — ABNORMAL LOW (ref 3.5–5.0)
Anion gap: 8 (ref 5–15)
Anion gap: 10 (ref 5–15)
BUN: 35 mg/dL — ABNORMAL HIGH (ref 6–20)
BUN: 41 mg/dL — ABNORMAL HIGH (ref 6–20)
CO2: 25 mmol/L (ref 22–32)
CO2: 20 mmol/L — ABNORMAL LOW (ref 22–32)
Calcium: 8.2 mg/dL — ABNORMAL LOW (ref 8.9–10.3)
Calcium: 8.2 mg/dL — ABNORMAL LOW (ref 8.9–10.3)
Chloride: 101 mmol/L (ref 98–111)
Chloride: 103 mmol/L (ref 98–111)
Creatinine, Ser: 0.88 mg/dL (ref 0.61–1.24)
Creatinine, Ser: 1.2 mg/dL (ref 0.61–1.24)
GFR, Estimated: >60 mL/min (ref >60)
GFR, Estimated: >60 mL/min (ref >60)
Glucose, Bld: 161 mg/dL — ABNORMAL HIGH (ref 70–99)
Glucose, Bld: 221 mg/dL — ABNORMAL HIGH (ref 70–99)
Phosphorus: 2.1 mg/dL — ABNORMAL LOW (ref 2.5–4.6)
Phosphorus: 4.6 mg/dL (ref 2.5–4.6)
Potassium: 4.2 mmol/L (ref 3.5–5.1)
Potassium: 4.8 mmol/L (ref 3.5–5.1)
Sodium: 134 mmol/L — ABNORMAL LOW (ref 135–145)
Sodium: 133 mmol/L — ABNORMAL LOW (ref 135–145)

## 2020-03-02 LAB — GLUCOSE, CAPILLARY
Glucose-Capillary: 135 mg/dL — ABNORMAL HIGH (ref 70–99)
Glucose-Capillary: 142 mg/dL — ABNORMAL HIGH (ref 70–99)
Glucose-Capillary: 159 mg/dL — ABNORMAL HIGH (ref 70–99)
Glucose-Capillary: 160 mg/dL — ABNORMAL HIGH (ref 70–99)
Glucose-Capillary: 239 mg/dL — ABNORMAL HIGH (ref 70–99)
Glucose-Capillary: 90 mg/dL (ref 70–99)
Glucose-Capillary: 156 mg/dL — ABNORMAL HIGH (ref 70–99)
Glucose-Capillary: 142 mg/dL — ABNORMAL HIGH (ref 70–99)
Glucose-Capillary: 207 mg/dL — ABNORMAL HIGH (ref 70–99)
Glucose-Capillary: 255 mg/dL — ABNORMAL HIGH (ref 70–99)

## 2020-03-02 LAB — HEPARIN LEVEL (UNFRACTIONATED): Heparin Unfractionated: 0.35 IU/mL (ref 0.30–0.70)

## 2020-03-02 LAB — PREPARE RBC (CROSSMATCH)

## 2020-03-02 LAB — PREALBUMIN: Prealbumin: 19.4 mg/dL (ref 18–38)

## 2020-03-02 LAB — HEMOGLOBIN AND HEMATOCRIT, BLOOD
HCT: 30 % — ABNORMAL LOW (ref 39.0–52.0)
Hemoglobin: 8.7 g/dL — ABNORMAL LOW (ref 13.0–17.0)

## 2020-03-02 LAB — MAGNESIUM: Magnesium: 2.5 mg/dL — ABNORMAL HIGH (ref 1.7–2.4)

## 2020-03-02 SURGERY — DEBRIDEMENT, WOUND, STERNUM
Anesthesia: General | Site: Chest | Laterality: Bilateral

## 2020-03-02 MED ORDER — HYDROMORPHONE HCL 1 MG/ML IJ SOLN: 0.5000 mg | Freq: Once | INTRAMUSCULAR | Status: AC

## 2020-03-02 MED ORDER — VANCOMYCIN HCL IN DEXTROSE 1-5 GM/200ML-% IV SOLN
INTRAVENOUS | Status: AC
  Filled 2020-03-02: qty 200

## 2020-03-02 MED ORDER — MIDAZOLAM HCL 2 MG/2ML IJ SOLN
INTRAMUSCULAR | Status: AC
  Filled 2020-03-02: qty 4

## 2020-03-02 MED ORDER — SODIUM CHLORIDE 0.9 % IR SOLN
Status: DC | PRN
  Administered 2020-03-02: 2000 mL

## 2020-03-02 MED ORDER — ALBUMIN HUMAN 5 % IV SOLN
INTRAVENOUS | Status: AC
  Filled 2020-03-02: qty 250

## 2020-03-02 MED ORDER — FENTANYL CITRATE (PF) 250 MCG/5ML IJ SOLN
INTRAMUSCULAR | Status: DC | PRN
  Administered 2020-03-02 (×5): 50 ug via INTRAVENOUS

## 2020-03-02 MED ORDER — HEMOSTATIC AGENTS (NO CHARGE) OPTIME
TOPICAL | Status: DC | PRN
  Administered 2020-03-02: 2 via TOPICAL

## 2020-03-02 MED ORDER — FENTANYL CITRATE (PF) 250 MCG/5ML IJ SOLN
INTRAMUSCULAR | Status: AC
  Filled 2020-03-02: qty 5

## 2020-03-02 MED ORDER — VANCOMYCIN HCL 1000 MG IV SOLR
INTRAVENOUS | Status: DC | PRN
  Administered 2020-03-02: 1000 mg via INTRAVENOUS

## 2020-03-02 MED ORDER — DOCUSATE SODIUM 50 MG/5ML PO LIQD
100.0000 mg | Freq: Two times a day (BID) | ORAL | Status: DC
  Administered 2020-03-02 – 2020-03-07 (×4): 100 mg
  Filled 2020-03-02 (×6): qty 10

## 2020-03-02 MED ORDER — FENTANYL BOLUS VIA INFUSION
50.0000 ug | INTRAVENOUS | Status: DC | PRN
  Administered 2020-03-03 (×3): 50 ug via INTRAVENOUS
  Filled 2020-03-02: qty 50

## 2020-03-02 MED ORDER — MIDAZOLAM HCL 2 MG/2ML IJ SOLN
INTRAMUSCULAR | Status: DC | PRN
  Administered 2020-03-02: 4 mg via INTRAVENOUS

## 2020-03-02 MED ORDER — POLYETHYLENE GLYCOL 3350 17 G PO PACK
17.0000 g | PACK | Freq: Every day | ORAL | Status: DC
  Administered 2020-03-04 – 2020-03-07 (×2): 17 g
  Filled 2020-03-02 (×2): qty 1

## 2020-03-02 MED ORDER — CHLORHEXIDINE GLUCONATE CLOTH 2 % EX PADS: 6.0000 | MEDICATED_PAD | Freq: Once | CUTANEOUS | Status: DC

## 2020-03-02 MED ORDER — SODIUM CHLORIDE 0.9% IV SOLUTION: Freq: Once | INTRAVENOUS | Status: DC

## 2020-03-02 MED ORDER — ROCURONIUM BROMIDE 10 MG/ML (PF) SYRINGE
PREFILLED_SYRINGE | INTRAVENOUS | Status: DC | PRN
  Administered 2020-03-02: 100 mg via INTRAVENOUS

## 2020-03-02 MED ORDER — LORAZEPAM 2 MG/ML IJ SOLN
1.0000 mg | INTRAMUSCULAR | Status: DC
  Administered 2020-03-02 – 2020-03-03 (×6): 1 mg via INTRAVENOUS
  Filled 2020-03-02 (×6): qty 1

## 2020-03-02 MED ORDER — FENTANYL CITRATE (PF) 100 MCG/2ML IJ SOLN: 50.0000 ug | Freq: Once | INTRAMUSCULAR | Status: DC

## 2020-03-02 MED ORDER — VASOPRESSIN 20 UNIT/ML IV SOLN
INTRAVENOUS | Status: AC
  Filled 2020-03-02: qty 1

## 2020-03-02 MED ORDER — FENTANYL 2500MCG IN NS 250ML (10MCG/ML) PREMIX INFUSION
50.0000 ug/h | INTRAVENOUS | Status: DC
  Administered 2020-03-03: 50 ug/h via INTRAVENOUS
  Filled 2020-03-02 (×2): qty 250

## 2020-03-02 MED ORDER — LACTATED RINGERS IV SOLN: INTRAVENOUS | Status: DC | PRN

## 2020-03-02 MED ORDER — PROPOFOL 10 MG/ML IV BOLUS
INTRAVENOUS | Status: AC
  Filled 2020-03-02: qty 20

## 2020-03-02 MED ORDER — HYDROMORPHONE HCL 1 MG/ML IJ SOLN
INTRAMUSCULAR | Status: AC
  Administered 2020-03-02: 0.5 mg via INTRAVENOUS
  Filled 2020-03-02: qty 0.5

## 2020-03-02 MED ORDER — NOREPINEPHRINE 16 MG/250ML-% IV SOLN
0.0000 ug/min | INTRAVENOUS | Status: DC
  Administered 2020-03-02: 5 ug/min via INTRAVENOUS
  Administered 2020-03-03: 15 ug/min via INTRAVENOUS
  Filled 2020-03-02: qty 250

## 2020-03-02 SURGICAL SUPPLY — 60 items
AGENT HMST 10 BLLW SHRT CANN (HEMOSTASIS) ×2
APL SKNCLS STERI-STRIP NONHPOA (GAUZE/BANDAGES/DRESSINGS)
BAG DECANTER FOR FLEXI CONT (MISCELLANEOUS) ×3 IMPLANT
BAG URIMETER BARDEX IC 350 (UROLOGICAL SUPPLIES) ×1 IMPLANT
BANDAGE ESMARK 6X9 LF (GAUZE/BANDAGES/DRESSINGS) IMPLANT
BENZOIN TINCTURE PRP APPL 2/3 (GAUZE/BANDAGES/DRESSINGS) IMPLANT
BLADE CLIPPER SURG (BLADE) ×2 IMPLANT
BLADE SURG 10 STRL SS (BLADE) ×2 IMPLANT
BNDG CMPR 9X6 STRL LF SNTH (GAUZE/BANDAGES/DRESSINGS)
BNDG ESMARK 6X9 LF (GAUZE/BANDAGES/DRESSINGS)
BNDG GAUZE ELAST 4 BULKY (GAUZE/BANDAGES/DRESSINGS) IMPLANT
CANISTER SUCT 3000ML PPV (MISCELLANEOUS) ×2 IMPLANT
CANISTER WOUND CARE 500ML ATS (WOUND CARE) ×2 IMPLANT
CATH FOLEY 2WAY SLVR  5CC 16FR (CATHETERS)
CATH FOLEY 2WAY SLVR 5CC 16FR (CATHETERS) IMPLANT
CLIP VESOCCLUDE SM WIDE 24/CT (CLIP) ×1 IMPLANT
CNTNR URN SCR LID CUP LEK RST (MISCELLANEOUS) IMPLANT
CONT SPEC 4OZ STRL OR WHT (MISCELLANEOUS)
COVER SURGICAL LIGHT HANDLE (MISCELLANEOUS) ×4 IMPLANT
DRAPE INCISE IOBAN 66X45 STRL (DRAPES) IMPLANT
DRAPE LAPAROSCOPIC ABDOMINAL (DRAPES) ×3 IMPLANT
DRAPE WARM FLUID 44X44 (DRAPES) IMPLANT
DRSG AQUACEL AG ADV 3.5X14 (GAUZE/BANDAGES/DRESSINGS) ×2 IMPLANT
DRSG MEPILEX BORDER 4X4 (GAUZE/BANDAGES/DRESSINGS) ×1 IMPLANT
DRSG PAD ABDOMINAL 8X10 ST (GAUZE/BANDAGES/DRESSINGS) ×2 IMPLANT
DRSG VAC ATS LRG SENSATRAC (GAUZE/BANDAGES/DRESSINGS) IMPLANT
DRSG VAC ATS MED SENSATRAC (GAUZE/BANDAGES/DRESSINGS) ×2 IMPLANT
DRSG VAC ATS SM SENSATRAC (GAUZE/BANDAGES/DRESSINGS) IMPLANT
DRSG VERSA FOAM LRG 10X15 (GAUZE/BANDAGES/DRESSINGS) ×1 IMPLANT
ELECT REM PT RETURN 9FT ADLT (ELECTROSURGICAL) ×2
ELECTRODE REM PT RTRN 9FT ADLT (ELECTROSURGICAL) ×1 IMPLANT
GAUZE SPONGE 4X4 12PLY STRL (GAUZE/BANDAGES/DRESSINGS) ×5 IMPLANT
GAUZE XEROFORM 5X9 LF (GAUZE/BANDAGES/DRESSINGS) ×1 IMPLANT
GLOVE SURG NEOP MICRO LF SZ7.5 (GLOVE) ×1 IMPLANT
GLOVE SURG UNDER POLY LF SZ6.5 (GLOVE) ×2 IMPLANT
GOWN STRL REUS W/ TWL LRG LVL3 (GOWN DISPOSABLE) ×3 IMPLANT
GOWN STRL REUS W/TWL LRG LVL3 (GOWN DISPOSABLE) ×6
HANDPIECE INTERPULSE COAX TIP (DISPOSABLE)
HEMOSTAT HEMOBLAST BELLOWS (HEMOSTASIS) ×2 IMPLANT
HEMOSTAT POWDER SURGIFOAM 1G (HEMOSTASIS) IMPLANT
HEMOSTAT SURGICEL 2X14 (HEMOSTASIS) IMPLANT
KIT BASIN OR (CUSTOM PROCEDURE TRAY) ×2 IMPLANT
KIT TURNOVER KIT B (KITS) ×2 IMPLANT
NS IRRIG 1000ML POUR BTL (IV SOLUTION) ×2 IMPLANT
PACK GENERAL/GYN (CUSTOM PROCEDURE TRAY) ×2 IMPLANT
PAD ARMBOARD 7.5X6 YLW CONV (MISCELLANEOUS) ×4 IMPLANT
SET HNDPC FAN SPRY TIP SCT (DISPOSABLE) IMPLANT
SOL PREP POV-IOD 4OZ 10% (MISCELLANEOUS) IMPLANT
SPONGE LAP 18X18 RF (DISPOSABLE) ×2 IMPLANT
STAPLER VISISTAT 35W (STAPLE) IMPLANT
SUT ETHILON 3 0 FSL (SUTURE) IMPLANT
SUT PDS AB 1 CTX 36 (SUTURE) IMPLANT
SUT PROLENE 2 0 MH 48 (SUTURE) IMPLANT
SUT VIC AB 2-0 CTX 27 (SUTURE) ×4 IMPLANT
SWAB COLLECTION DEVICE MRSA (MISCELLANEOUS) IMPLANT
SWAB CULTURE ESWAB REG 1ML (MISCELLANEOUS) IMPLANT
SYR 5ML LL (SYRINGE) IMPLANT
TOWEL GREEN STERILE (TOWEL DISPOSABLE) ×2 IMPLANT
TOWEL GREEN STERILE FF (TOWEL DISPOSABLE) ×2 IMPLANT
WATER STERILE IRR 1000ML POUR (IV SOLUTION) ×2 IMPLANT

## 2020-03-02 NOTE — Progress Notes (Signed)
OT Cancellation Note  Patient Details Name: Corey Palmer MRN: [id number] DOB: 10-03-1970   Cancelled Treatment:    Reason Eval/Treat Not Completed: Medical issues which prohibited therapy.  OT to continue efforts as appropriate.    Richard D Popella 03/26/2020, 9:13 AM

## 2020-03-02 NOTE — Plan of Care (Signed)
  Problem: Clinical Measurements: Goal: Diagnostic test results will improve Outcome: Progressing   Problem: Clinical Measurements: Goal: Respiratory complications will improve Outcome: Progressing   Problem: Clinical Measurements: Goal: Cardiovascular complication will be avoided Outcome: Progressing   Problem: Cardiac: Goal: Will achieve and/or maintain hemodynamic stability Outcome: Progressing   Problem: Respiratory: Goal: Respiratory status will improve Outcome: Progressing   Problem: Skin Integrity: Goal: Wound healing without signs and symptoms of infection Outcome: Progressing   Problem: Skin Integrity: Goal: Risk for impaired skin integrity will decrease Outcome: Progressing

## 2020-03-02 NOTE — Brief Op Note (Signed)
03/01/2020  4:34 PM  PATIENT:  Corey Palmer  50 y.o. male  PRE-OPERATIVE DIAGNOSIS:  BLEEDING, sternal defect  POST-OPERATIVE DIAGNOSIS:  BLEEDING, sternal defect  PROCEDURE:  Procedure(s): STERNAL WOUND DEBRIDEMENT (Bilateral) WOUND EXPLORATION (Bilateral) APPLICATION OF WOUND VAC (Bilateral)  SURGEON:  Surgeon(s) and Role:    * Dillingham, Loel Lofty, DO - Primary    * [name], MD  PHYSICIAN ASSISTANT:   ASSISTANTS: staff   ANESTHESIA:   general  EBL:  50 mL   BLOOD ADMINISTERED:none  DRAINS: none   LOCAL MEDICATIONS USED:  NONE  SPECIMEN:  No Specimen  DISPOSITION OF SPECIMEN:  N/A  COUNTS:  YES  TOURNIQUET:  * No tourniquets in log *  DICTATION: .Note written in EPIC  PLAN OF CARE: Admit to inpatient   PATIENT DISPOSITION:  ICU - intubated and critically ill.   Delay start of Pharmacological VTE agent (>24hrs) due to surgical blood loss or risk of bleeding: yes

## 2020-03-02 NOTE — Transfer of Care (Signed)
Immediate Anesthesia Transfer of Care Note  Patient: GERROD MAULE  Procedure(s) Performed: STERNAL WOUND DEBRIDEMENT (Bilateral Chest) WOUND EXPLORATION (Bilateral Chest) APPLICATION OF WOUND VAC (Bilateral Chest)  Patient Location: SICU  Anesthesia Type:General  Level of Consciousness: drowsy, patient cooperative and Patient remains intubated per anesthesia plan  Airway & Oxygen Therapy: Patient remains intubated per anesthesia plan and Patient placed on Ventilator (see vital sign flow sheet for setting)  Post-op Assessment: Report given to RN and Post -op Vital signs reviewed and stable  Post vital signs: Reviewed and stable  Last Vitals:  Vitals Value Taken Time  BP    Temp    Pulse    Resp    SpO2      Last Pain:  Vitals:   03/01/2020 1105  TempSrc: Oral  PainSc:       Patients Stated Pain Goal: 0 (89/16/94 5038)  Complications: No complications documented.

## 2020-03-02 NOTE — Interval H&P Note (Signed)
History and Physical Interval Note:  03/24/2020 12:23 PM  Corey Palmer  has presented today for surgery, with the diagnosis of BLEEDING.  The various methods of treatment have been discussed with the patient and family. After consideration of risks, benefits and other options for treatment, the patient has consented to  Procedure(s): STERNAL WOUND DEBRIDEMENT (N/A) WOUND EXPLORATION (N/A) APPLICATION OF WOUND VAC (N/A) as a surgical intervention.  [name] history has been reviewed, patient examined, no change in status, stable for surgery.  I have reviewed the patient's chart and labs.  Questions were answered to the patient's satisfaction.     [name]

## 2020-03-02 NOTE — Progress Notes (Signed)
[facility] NEPHROLOGY PROGRESS NOTE  Assessment/ Plan: Pt is a 50 y.o. yo male  with familial cardiomyopathy EF20% s/p redo CABG 81/10/31 complicated by persistent shock, multiple attempts at sternal closure with formation of chest hematoma and tamponade, Afib with RVR, and AKI requiring CRRT.    # Anuric AKI/CKD stage III- presumably due to ischemic ATN in setting of cardiogenic shock s/p CABG.  Likely ESRD at this point.  CRRT started 01/16/20.  All fluids 4K/2.5Ca.   Continue CRRT, no prescription change, UF is running even-Hyperkalemia improved.  The line was changed to right temp femoral catheter- has been in for 12 days.  He is on inotropic support currently.  At some point need to see if he will tolerate iHD but in light of still open chest we will continue CRRT for now so we don't get too far behind with I/Os -- his intake is typically ~3.5L/day so would need to tolerate fairly robust UF with HD which I dont think he could do  # CAD s/p CABG complicated by cardiogenic shock and post-op hematoma. secondary closure on 12/23 then had to be re opened. Went to the OR for washout on 12/28, bedside washout 02/01/19.  OR 02/17/2020 for closure, then had emergent reopening at the bedside 02/23/2020 d/t anterior chest hematoma again. S/p OR plastic surgery 1/27 - excision of wound, 2cm closure. OR 2/2-  Chest still not healing-  Not sure what our endpoint is here   # Cardiogenic shock- underlying familial cardiomyopathy s/p redo CABG, EF ~20%. Remains on inotrope,  Midodrine 20 tid.  The Impella was removed on 1/21.  # ABLA- s/p postoperative bleeding into chest cavity s/p re-exploration for tamponade/hematoma. Received multiple blood products. On ESA- no heparin- hgb down today with events  # Atrial fibrillation - s/p DCCV on 12/16. On amiodarone.   # VDRF- per CHF/PCCMsvc. Trach/peg 1/10  Elytes-  Phos low but cannot replete with sodium phos-   sodium phos  PRN  Subjective: Seen  and examined in ICU.  Went to OR yesterday-  Reportedly the healing is not good-  Were not able to close completely - has wound vac, now bleeding excessively -  Stopping heparin.  No problem with CRRT.   Discussed with RN- ran even   Objective Vital signs in last 24 hours: Vitals:   02/29/2020 0400 03/18/2020 0422 03/12/2020 0500 03/15/2020 0600  BP: 102/71  104/80 99/74  Pulse: 93  95 96  Resp: (!) 26  (!) 27 (!) 31  Temp: 98.8 F (37.1 C)     TempSrc: Axillary     SpO2: 100%  100% 100%  Weight:  81.3 kg    Height:       Weight change: -2.4 kg  Intake/Output Summary (Last 24 hours) at 03/25/2020 0727 Last data filed at 03/04/2020 0600 Gross per 24 hour  Intake 2742.59 ml  Output 2542 ml  Net 200.59 ml    Labs: Basic Metabolic Panel: Recent Labs  Lab 03/15/2020 0425 03/17/2020 1419 03/10/2020 0421  NA 134* 129* 134*  K 4.2 4.8 4.2  CL 99 97* 101  CO2 '25 24 25  ' GLUCOSE 97 327* 161*  BUN 41* 38* 35*  CREATININE 0.85 1.06 0.88  CALCIUM 8.6* 8.2* 8.2*  PHOS 2.0* 3.5 2.1*   Liver Function Tests: Recent Labs  Lab 03/25/2020 0425 03/16/2020 1419 03/09/2020 0421  ALBUMIN 2.0* 1.8* 1.9*   No results for input(s): LIPASE, AMYLASE in the last 168 hours. No results for  input(s): AMMONIA in the last 168 hours. CBC: Recent Labs  Lab 02/27/20 0436 02/28/20 0332 02/29/20 0322 03/04/2020 0425 03/12/2020 0421  WBC 15.3* 12.6* 17.0* 14.8* 15.2*  NEUTROABS 12.6* 10.2* 14.3* 12.0* 12.5*  HGB 8.9* 9.1* 9.7* 10.0* 9.5*  HCT 31.4* 32.0* 33.8* 34.3* 32.7*  MCV 93.2 93.6 92.3 91.7 91.6  PLT 339 336 394 390 411*   Cardiac Enzymes: No results for input(s): CKTOTAL, CKMB, CKMBINDEX, TROPONINI in the last 168 hours. CBG: Recent Labs  Lab 03/05/2020 1351 02/29/2020 1524  1935 03/05/2020 0005 03/27/2020 0428  GLUCAP 128* 142* 239* 159* 160*    Iron Studies: No results for input(s): IRON, TIBC, TRANSFERRIN, FERRITIN in the last 72 hours. Studies/Results: DG Chest 1 View  Result Date:  03/18/2020 CLINICAL DATA:  Tracheostomy.  Open-heart surgery. EXAM: CHEST  1 VIEW COMPARISON:  02/28/2020.  CT 02/12/2020. FINDINGS: Tracheostomy tube and right PICC line and stable position. Cardiac pacer in stable position. Stable cardiomegaly. Low lung volumes with bibasilar atelectasis. Bibasilar infiltrates cannot be excluded. No prominent pleural effusion. Left costophrenic angle incompletely imaged. Stable right apical pleural based density, possibly loculated fluid again noted. No pneumothorax. Surgical staples noted over the right chest IMPRESSION: 1. Tracheostomy tube and right PICC line in stable position. 2. Cardiac pacer in stable position. Stable cardiomegaly. 3. Low lung volumes with bibasilar atelectasis. Bibasilar infiltrates cannot be excluded. Stable right apical pleural based density loculated pleural effusion. No pneumothorax. Electronically Signed   By: Marcello Moores  Register   On: 03/16/2020 06:05   DG Chest Port 1 View  Result Date: 03/07/2020 CLINICAL DATA:  Abnormal respiration EXAM: PORTABLE CHEST 1 VIEW COMPARISON:  03/07/2020 FINDINGS: Tracheostomy tube tip terminates in the mid trachea, 4.8 cm from the carina. Pacer/defibrillator battery pack overlies left chest wall with lead at the cardiac apex. Surgical changes noted in the right axilla correlate for prior vascular cutdown. Right upper extremity PICC tip terminates in the mid SVC. Persistent thickening along the right lung apex may reflect some loculated fluid. No basilar effusions are seen. No new airspace disease. No visible pneumothorax. Stable cardiomediastinal contours with cardiomegaly. No other acute osseous or soft tissue abnormality. IMPRESSION: 1. Tracheostomy and right PICC in stable satisfactory position. 2. Persistent thickening along the right lung apex may reflect some loculated fluid. 3. Low volumes and atelectasis without new airspace disease. Electronically Signed   By: Lovena Le M.D.   On: 03/27/2020 05:52     Medications: Infusions: .  prismasol BGK 4/2.5 500 mL/hr at 03/25/2020 1539  .  prismasol BGK 4/2.5 200 mL/hr at 03/22/2020 1539  . sodium chloride Stopped (03/16/2020 0753)  . sodium chloride    . dexmedetomidine (PRECEDEX) IV infusion 1 mcg/kg/hr (03/14/2020 0600)  . dextrose Stopped (02/29/2020 1520)  . DOBUTamine 3 mcg/kg/min (03/18/2020 0600)  . feeding supplement (PIVOT 1.5 CAL) 75 mL/hr at 03/23/2020 0400  . heparin 1,750 Units/hr (03/21/2020 0600)  . lactated ringers Stopped (02/29/20 0835)  . prismasol BGK 4/2.5 1,500 mL/hr at 03/21/2020 0932    Scheduled Medications: . sodium chloride   Intravenous Once  . amiodarone  200 mg Per Tube BID  . vitamin C  500 mg Per Tube TID  . aspirin  324 mg Per Tube Daily  . atorvastatin  80 mg Per Tube Daily  . busPIRone  10 mg Per Tube TID  . chlorhexidine gluconate (MEDLINE KIT)  15 mL Mouth Rinse BID  . Chlorhexidine Gluconate Cloth  6 each Topical Daily  . clonazepam  1 mg Oral QHS  . darbepoetin (ARANESP) injection - NON-DIALYSIS  100 mcg Subcutaneous Q Sat-1800  . feeding supplement (PROSource TF)  45 mL Per Tube TID  . fiber  1 packet Per Tube BID  . FLUoxetine  40 mg Per Tube BID  . HYDROmorphone  4 mg Per Tube Q6H  . insulin aspart  0-9 Units Subcutaneous Q4H  . insulin aspart  4 Units Subcutaneous Q4H  . insulin detemir  12 Units Subcutaneous Q12H  . LORazepam  0.5 mg Intravenous Q4H  . mouth rinse  15 mL Mouth Rinse 10 times per day  . melatonin  5 mg Per Tube Daily  . mexiletine  150 mg Per Tube Q8H  . midodrine  20 mg Per Tube TID WC  . multivitamin  1 tablet Per Tube QHS  . nutrition supplement (JUVEN)  1 packet Per Tube BID BM  . pantoprazole (PROTONIX) IV  40 mg Intravenous Q12H  . sodium chloride flush  10-40 mL Intracatheter Q12H  . thiamine  100 mg Per Tube Daily  . valproic acid  250 mg Per Tube BID    have reviewed scheduled and prn medications.  Physical Exam: General: Critically ill looking male sedated and on vent  via trach.  Heart:RRR, s1s2 nl Lungs: Coarse breath sound bilateral Abdomen:soft, Non-tender, non-distended Extremities really minimal edema  Dialysis Access:  right femoral catheter site clean.  In for 12 days   Corey Palmer 03/26/2020,7:27 AM  LOS: 63 days

## 2020-03-02 NOTE — Anesthesia Postprocedure Evaluation (Signed)
Anesthesia Post Note  Patient: Corey Palmer  Procedure(s) Performed: STERNAL WOUND DEBRIDEMENT (Bilateral Chest) WOUND EXPLORATION (Bilateral Chest) APPLICATION OF WOUND VAC (Bilateral Chest)     Patient location during evaluation: ICU Anesthesia Type: General Level of consciousness: sedated Pain management: pain level controlled Vital Signs Assessment: post-procedure vital signs reviewed and stable Respiratory status: patient on ventilator - see flowsheet for VS Cardiovascular status: blood pressure returned to baseline (On vasopressor support as in preop) Postop Assessment: no apparent nausea or vomiting Anesthetic complications: no   No complications documented.  Last Vitals:  Vitals:   03/11/2020 1400 03/07/2020 1415  BP: (!) 88/65 112/75  Pulse: 69 67  Resp: (!) 7 20  Temp:    SpO2: 100% 100%    Last Pain:  Vitals:   03/12/2020 1105  TempSrc: Oral  PainSc:                  Audry Pili

## 2020-03-02 NOTE — Progress Notes (Addendum)
Patient ID: Corey Palmer, male   DOB: Mar 03, 1970, 50 y.o.   MRN: [id number]     Advanced Heart Failure Rounding Note  PCP-Cardiologist: No primary care provider on file.   Subjective:    - 12/7 Torsades -ICD shock x1.  - 12/11 Impella 5.5 placed.  - 12/12 Impella repositioned in OR - 12/15 CABG x 4 with LIMA-LAD, SVG-PDA/PLV, SVG-OM - 12/16 DCCV afib.  Back to OR to re-open chest and again to evacuate hematoma.  - 12/19 CVVH initiated  - 12/23 Chest closed - 12/24 Chest reopened - 12/26 RAP back into NSR - 12/27 back in atrial fibrillation, unable to rapid atrial pace out.   - 12/28 To OR, chest partially closed and wound vac placed.  DCCV to NSR.  - 12/31 Antibiotics stopped. Swan removed, HD catheter moved from right femoral to right IJ.  - 1/5 To OR for chest closure. Chest reopened emergently at bedside again to evacuate hematoma  - 1/10 To OR: omental flap closure of chest with wound vac, tracheostomy, G-tube, J-tube.    - 1/12 Plastics and EP consulted.  - 1/13 Palliative Care consulted.  - 1/15 Went into VT, Shock 200 J and amiodarone bolus 150.   - 1/16: Milrinone stopped.    - 1/17: Partial closure of chest in OR - 1/18 Switched to dobutamine 4 mcg - 1/21 Impella Extracted.  Developed hyperkalemia when CVVH held with wide QRS rhythm and required transient external pacing.   - 1/27 OR for wound vac change, partial chest wall closure.  - 2/2 S/P sternal debridement , closure part of sternal wound, A cell   Remains on dobutamine 3 mcg . CO-OX 60%.   Has had bright red blood from VAC dressing. Placed back on heparin drip. Hgb 9.5   Remains on CVVHD, running even to mildly negative. On vent with trach, has been doing PSV trials.      Objective:   Weight Range: 81.3 kg Body mass index is 22.4 kg/m.   Vital Signs:   Temp:  [96.3 F (35.7 C)-99 F (37.2 C)] 98.8 F (37.1 C) (02/03 0400) Pulse Rate:  [78-110] 96 (02/03 0600) Resp:  [17-37] 31 (02/03 0600) BP:  (85-113)/(58-84) 99/74 (02/03 0600) SpO2:  [97 %-100 %] 100 % (02/03 0600) FiO2 (%):  [30 %] 30 % (02/03 0400) Weight:  [81.3 kg] 81.3 kg (02/03 0422) Last BM Date: 03/19/2020  Weight change: Filed Weights   02/29/20 0500 03/20/2020 0422 03/03/2020 0422  Weight: 81.6 kg 83.7 kg 81.3 kg    Intake/Output:   Intake/Output Summary (Last 24 hours) at 03/10/2020 0710 Last data filed at  0600 Gross per 24 hour  Intake 2742.59 ml  Output 2542 ml  Net 200.59 ml      Physical Exam  General:  On vent   HEENT: normal Neck: Trach  supple. no JVD. Carotids 2+ bilat; no bruits. No lymphadenopathy or thryomegaly appreciated. Cor: PMI nondisplaced. Regular rate & rhythm. No rubs, gallops or murmurs.Sternal wound with bright red blood.  Lungs: Coarse  Abdomen: soft, nontender, nondistended. No hepatosplenomegaly. No bruits or masses. Good bowel sounds. G/J Tube.  Extremities: no cyanosis, clubbing, rash, edema Neuro: Agitated.   Telemetry   SR 80-90s   Labs    CBC Recent Labs    03/01/2020 0425 03/20/2020 0421  WBC 14.8* 15.2*  NEUTROABS 12.0* 12.5*  HGB 10.0* 9.5*  HCT 34.3* 32.7*  MCV 91.7 91.6  PLT 390 474*   Basic Metabolic Panel Recent  Labs    03/16/2020 0425 03/23/2020 1419 03/22/2020 0421  NA 134* 129* 134*  K 4.2 4.8 4.2  CL 99 97* 101  CO2 '25 24 25  ' GLUCOSE 97 327* 161*  BUN 41* 38* 35*  CREATININE 0.85 1.06 0.88  CALCIUM 8.6* 8.2* 8.2*  MG 2.5*  --  2.5*  PHOS 2.0* 3.5 2.1*   Liver Function Tests Recent Labs    03/22/2020 1419 03/12/2020 0421  ALBUMIN 1.8* 1.9*   No results for input(s): LIPASE, AMYLASE in the last 72 hours. Cardiac Enzymes No results for input(s): CKTOTAL, CKMB, CKMBINDEX, TROPONINI in the last 72 hours.  BNP: BNP (last 3 results) Recent Labs    01/03/2020 1619  BNP 577.5*    ProBNP (last 3 results) No results for input(s): PROBNP in the last 8760 hours.   D-Dimer No results for input(s): DDIMER in the last 72 hours. Hemoglobin  A1C No results for input(s): HGBA1C in the last 72 hours. Fasting Lipid Panel No results for input(s): CHOL, HDL, LDLCALC, TRIG, CHOLHDL, LDLDIRECT in the last 72 hours. Thyroid Function Tests No results for input(s): TSH, T4TOTAL, T3FREE, THYROIDAB in the last 72 hours.  Invalid input(s): FREET3  Other results:   Imaging    DG Chest Port 1 View  Result Date: 03/22/2020 CLINICAL DATA:  Abnormal respiration EXAM: PORTABLE CHEST 1 VIEW COMPARISON:  03/11/2020 FINDINGS: Tracheostomy tube tip terminates in the mid trachea, 4.8 cm from the carina. Pacer/defibrillator battery pack overlies left chest wall with lead at the cardiac apex. Surgical changes noted in the right axilla correlate for prior vascular cutdown. Right upper extremity PICC tip terminates in the mid SVC. Persistent thickening along the right lung apex may reflect some loculated fluid. No basilar effusions are seen. No new airspace disease. No visible pneumothorax. Stable cardiomediastinal contours with cardiomegaly. No other acute osseous or soft tissue abnormality. IMPRESSION: 1. Tracheostomy and right PICC in stable satisfactory position. 2. Persistent thickening along the right lung apex may reflect some loculated fluid. 3. Low volumes and atelectasis without new airspace disease. Electronically Signed   By: Lovena Le M.D.   On: 03/11/2020 05:52     Medications:     Scheduled Medications:  sodium chloride   Intravenous Once   amiodarone  200 mg Per Tube BID   vitamin C  500 mg Per Tube TID   aspirin  324 mg Per Tube Daily   atorvastatin  80 mg Per Tube Daily   busPIRone  10 mg Per Tube TID   chlorhexidine gluconate (MEDLINE KIT)  15 mL Mouth Rinse BID   Chlorhexidine Gluconate Cloth  6 each Topical Daily   clonazepam  1 mg Oral QHS   darbepoetin (ARANESP) injection - NON-DIALYSIS  100 mcg Subcutaneous Q Sat-1800   feeding supplement (PROSource TF)  45 mL Per Tube TID   fiber  1 packet Per Tube BID    FLUoxetine  40 mg Per Tube BID   HYDROmorphone  4 mg Per Tube Q6H   insulin aspart  0-9 Units Subcutaneous Q4H   insulin aspart  4 Units Subcutaneous Q4H   insulin detemir  12 Units Subcutaneous Q12H   LORazepam  0.5 mg Intravenous Q4H   mouth rinse  15 mL Mouth Rinse 10 times per day   melatonin  5 mg Per Tube Daily   mexiletine  150 mg Per Tube Q8H   midodrine  20 mg Per Tube TID WC   multivitamin  1 tablet Per Tube QHS  nutrition supplement (JUVEN)  1 packet Per Tube BID BM   pantoprazole (PROTONIX) IV  40 mg Intravenous Q12H   sodium chloride flush  10-40 mL Intracatheter Q12H   thiamine  100 mg Per Tube Daily   valproic acid  250 mg Per Tube BID    Infusions:   prismasol BGK 4/2.5 500 mL/hr at 03/23/2020 1539    prismasol BGK 4/2.5 200 mL/hr at 03/03/2020 1539   sodium chloride Stopped (03/17/2020 0753)   sodium chloride     dexmedetomidine (PRECEDEX) IV infusion 1 mcg/kg/hr (03/15/2020 0600)   dextrose Stopped (03/05/2020 1520)   DOBUTamine 3 mcg/kg/min (03/10/2020 0600)   feeding supplement (PIVOT 1.5 CAL) 75 mL/hr at 03/21/2020 0400   heparin 1,750 Units/hr (03/03/2020 0600)   lactated ringers Stopped (02/29/20 0835)   prismasol BGK 4/2.5 1,500 mL/hr at 03/07/2020 0932    PRN Medications: sodium chloride, Place/Maintain arterial line **AND** sodium chloride, artificial tears, bisacodyl **OR** bisacodyl, dextrose, docusate, heparin, heparin, lip balm, loperamide HCl, metoprolol tartrate, midazolam, nystatin, polyethylene glycol, sodium chloride flush     Assessment/Plan   1. Cardiogenic shock/acute on chronic systolic CHF: Initially nonischemic cardiomyopathy.  Possible familial cardiomyopathy as both parents had cardiomyopathy and died at around 30. However, [employer] gene testing did not show any common mutation for cardiomyopathy.  However, this admission noted to have severe 3 vessel disease so suspect component of ischemic cardiomyopathy.  St Jude ICD.  Echo  in 8/20 with EF 15% and mildly decreased RV function. Echo this admission with EF < 20%, moderate LV dilation, RV mildly reduced, severe LAE, no significant MR. Low output HF with markedly low EF.  He has a long history of cardiomyopathy (20 yrs), tends to minimize symptoms.  Cardiorenal syndrome with creatinine up to 2.2,  stabilized on milrinone and Impella 5.5. SCr 1.44 day of CABG. CABG 12/15.  Post-op shock, back to OR 12/16 to open chest (chest wall compartment syndrome) and again to evacuate hematoma.  TEE post-op with severe RV dysfunction.  CVVHD initiated 12/19 for volume removal in setting of rising Scr/decreased UOP. Suspect ATN. S/p chest closure 12/23. Hemodynamics much worse on 12/24 with rising pressor demands. Co-ox 36%.  Bedside echo severe biventricular dysfunction with marked septal bounce. Chest re-opened 12/24.  Was atrial paced back to NSR on 12/26 with improvement in hemodynamics but back in atrial fibrillation on 12/27 and unable to pace out, DCCV to NSR on 12/28. Went back to OR 1/5 for chest closure but several hours later required emergent re-opening of chest at bedside to evacuate hematoma. To OR again 1/10 with closure of chest with omental flap with G & J tube.  1/17 partial closure. 1/21 Impella removed.  Off CVVH post-removal, he developed hyperkalemia and wide QRS rhythm requiring transient external pacing, also with suspected septic shock (low SVR, fever, rising WBCs).  - CVP 5-6 .  Pressures have been remained soft. Currently on midodrine 20 tid.  - Continue dobutamine to 3. CO-OX 60%.  - Not candidate to replace Impella (at this point, not candidate for LVAD or transplant).  2. Atrial fibrillation: H/o PAF.  He was on dofetilide in the remote past but this was stopped due to noncompliance.  He had an upper GI bleed from antral ulcers in 3/12. He was seen by GI and was deemed safe to restart anticoagulation as long as he remains on a PPI.  No apparent recurrence of AF until  just prior to this admission, was cardioverted back to NSR  in ER but went back into atrial fibrillation.  Post-op afib, DCCV to NSR/BiV pacing am 12/16 -> back to AF. Rapid atrial pacing back to SR. Remains in NSR - Continue amiodarone per tube 200 mg bid.  - Continue heparin gtt, transition to Eliquis when surgeries are completed.   3. CAD:   Cath this admission with severe 3 vessel disease. CABG x 4 on 12/15.  - Continue atorvastatin, ASA.  4. Acute on chronic hypoxemic respiratory failure: Vent per CCM.  Has tracheostomy. Has been getting PSV trials. - Trial trach collar after OR.  Per CCM 5. AKI on CKD stage 3: Likely ATN/cardiorenal. Anuric. On CVVH.    - Attempt transition to iHD probably tomorrow (OR today).  6. Diabetes: Insulin.  7. Hyponatremia: Resolved with CVVH.  8. Torsades/ICD shock: 12/7/212 in hospital event, in setting of severe hypokalemia and hypomagnesemia.   9. Gout: h/o gout. Complained of rt knee pain c/w previous flares - treated w/ prednisone burst (completed).  10: ID: Septic shock 1/21 with fever, low SVR, and elevated WBCs. Cultures no growth with exception of axillary cx on 1/21 grew staph epidermidis.  IJ catheter removed. Completed hydrocortisone.  - Off abx. 11. FEN: c/w TFs.  12. Anemia: Hgb 9.5   today.  Transfuse < 7.5.   13. Stage II Pressure Ulcer- sacrum: Continue to reposition every 2 hours R/L  14. Pleural Effusion: Loculated rt apical pleural effusion noted on imaging. Continue UF through CVVH.  PCCM following  15. VT: Amiodarone + mexiletine.   16. Deconditioning: Profound. Work with PT, nursing to work on range of motion.   I have called Dr Marla Roe regarding bleeding from sternal wound. Will stop heparin and see if this helps. May need to change VAC today.   Amy Clegg NP-C  03/09/2020 7:10 AM  Patient seen with NP, agree with the above note.   He went back to OR yesterday, per plastics he does not appear to be healing chest wall at all on  his own.  Now bleeding at wound vac site today.  Hgb early morning 9.5.    SBP 90s, co-ox 60% on dobutamine 3.    CVP 5, CVVH running even.   General: NAD Neck: Trach. No JVD, no thyromegaly or thyroid nodule.  Lungs: Clear to auscultation bilaterally with normal respiratory effort. CV: Wound vac chest wall with bleeding.  Heart regular S1/S2, no S3/S4, no murmur.  No peripheral edema.   Abdomen: Soft, nontender, no hepatosplenomegaly, no distention.  Skin: Intact without lesions or rashes.  Neurologic: Sedated on vent, will wake up per nursing. Extremities: No clubbing or cyanosis.  HEENT: Normal.   Chest wall not healing.  Now bleeding at wound vac site.  - Hold heparin.  - Evaluation by TCTS/plastics.   Continue to run CVVH even.  Question whether he will tolerate iHD with soft BP.  Continue current dobutamine 3.   Need family meeting with all specialties and wife, will ask palliative care to arrange for tomorrow morning.  I am concerned at this point that it will be a long shot to get him to LTAC.    CRITICAL CARE Performed by: Loralie Champagne  Total critical care time: 40 minutes  Critical care time was exclusive of separately billable procedures and treating other patients.  Critical care was necessary to treat or prevent imminent or life-threatening deterioration.  Critical care was time spent personally by me on the following activities: development of treatment plan with patient and/or surrogate  as well as nursing, discussions with consultants, evaluation of patient's response to treatment, examination of patient, obtaining history from patient or surrogate, ordering and performing treatments and interventions, ordering and review of laboratory studies, ordering and review of radiographic studies, pulse oximetry and re-evaluation of patient's condition.  Loralie Champagne 03/17/2020 8:18 AM

## 2020-03-02 NOTE — Anesthesia Procedure Notes (Signed)
Arterial Line Insertion Start/End02/08/2020 12:45 PM, 03/02/2020 12:51 PM Performed by: Darral Dash, DO, anesthesiologist  Patient location: OR. Preanesthetic checklist: patient identified, IV checked, site marked, risks and benefits discussed, surgical consent, monitors and equipment checked, pre-op evaluation, timeout performed and anesthesia consent Lidocaine 1% used for infiltration Right, radial was placed Catheter size: 20 Fr Hand hygiene performed  and maximum sterile barriers used   Attempts: 1 Procedure performed using ultrasound guided technique. Ultrasound Notes:anatomy identified, needle tip was noted to be adjacent to the nerve/plexus identified and no ultrasound evidence of intravascular and/or intraneural injection Following insertion, dressing applied and Biopatch. Post procedure assessment: normal and unchanged

## 2020-03-02 NOTE — Progress Notes (Signed)
[name] for Heparin Indication: Afib  Allergies  Allergen Reactions  . Orange Fruit Anaphylaxis, Hives and Other (See Comments)    Per Pt- Blisters around lips and Hives all over, also  . Penicillins Anaphylaxis    Did it involve swelling of the face/tongue/throat, SOB, or low BP? Yes Did it involve sudden or severe rash/hives, skin peeling, or any reaction on the inside of your mouth or nose? Yes Did you need to seek medical attention at a hospital or doctor's office? No When did it last happen?childhood If all above answers are "NO", may proceed with cephalosporin use.  Corey Palmer [Empagliflozin] Itching  . Basaglar Kwikpen [Insulin Glargine] Nausea And Vomiting    Patient Measurements: Height: 6\' 3"  (190.5 cm) Weight: 81.3 kg (179 lb 3.7 oz) IBW/kg (Calculated) : 84.5 Heparin dosing wt: 101 kg  Vital Signs: Temp: 97.2 F (36.2 C) (02/03 1105) Temp Source: Oral (02/03 1105) BP: 112/76 (02/03 1230) Pulse Rate: 69 (02/03 1230)  Labs: Recent Labs    02/29/20 0322 02/29/20 1612 03/11/2020 0425 03/03/2020 1419 03/14/2020 0421 03/26/2020 0807 03/21/2020 1259  HGB 9.7*  --  10.0*  --  9.5* 8.7* 9.5*  HCT 33.8*  --  34.3*  --  32.7* 30.0* 28.0*  PLT 394  --  390  --  411*  --   --   HEPARINUNFRC 0.34  --  0.37  --  0.35  --   --   CREATININE 0.92   < > 0.85 1.06 0.88  --   --    < > = values in this interval not displayed.    Estimated Creatinine Clearance: 114.2 mL/min (by C-G formula based on SCr of 0.88 mg/dL).   Assessment: 24 yoM with hx PAF on apixaban PTA. Pt admitted with shock and required Impella 5.5 support. CABG 12/15 with chest left open, c/b hematoma and significant bleeding. Ultimately heparin started via purge for Impella. DCCV performed 12/16, partial chest closure 1/5, Impella removed 1/21. Heparin resumed for AFib 1/22.  Heparin level at goal 0.35 on heparin drip rate 1750 uts/hr.Resumed last pm after return from OR  with chest wash out with plastics.  Oozing from wound vac this am fall hgb 10>8.7 Hold heparin for now   Goal of Therapy:  Heparin level ~0.3  Monitor platelets by anticoagulation protocol: Yes   Plan:  Hold heparin for now    [facility] Pharm.D. CPP, BCPS Clinical Pharmacist [phone] 03/05/2020 2:00 PM     Please check [email or web address] for unit-specific pharmacy phone numbers.

## 2020-03-02 NOTE — Progress Notes (Signed)
      [address]       ,[place] [address]             [phone]    S/p reexploration for bleeding  Sedated on vent  BP 100/70   Pulse 80   Temp (!) 96.5 F (35.8 C) (Axillary)   Resp 18   Ht 6\' 3"  (1.905 m)   Wt 81.3 kg   SpO2 100%   BMI 22.40 kg/m  Dobutamine at 3, norepi at 12   Intake/Output Summary (Last 24 hours) at 03/17/2020 1735 Last data filed at 03/22/2020 1720 Gross per 24 hour  Intake 3454.72 ml  Output 2480 ml  Net 974.72 ml   K= 4.8, Hct 28  Continue current care  Jakeria Caissie C. Roxan Hockey, MD [facility] [phone]

## 2020-03-02 NOTE — Anesthesia Preprocedure Evaluation (Addendum)
Anesthesia Evaluation  Patient identified by MRN, date of birth, ID band Patient unresponsive    Reviewed: Allergy & Precautions, NPO status , Patient's Chart, lab work & pertinent test results, Unable to perform ROS - Chart review only  History of Anesthesia Complications Negative for: history of anesthetic complications  Airway Mallampati: Trach       Dental   Pulmonary sleep apnea ,   Tracheostomy      + decreased breath sounds      Cardiovascular hypertension, Pt. on home beta blockers and Pt. on medications + CAD, + CABG and +CHF  + dysrhythmias Atrial Fibrillation + pacemaker + Cardiac Defibrillator  Rhythm:Regular Rate:Normal   Familial cardiomyopathy s/p redo CABG 39/76/73 complicated by persistent shock, multiple attempts at sternal closure with formation of chest hematoma and tamponade   '22 TEE - EF 20-25%. LV cavity size was severely dilated. Left ventricular diffuse hypokinesis. Impella  5.5 in situ with ~4.4cm distance into LV cavity from aortic annulus. The right ventricle has severely reduced systolic  function. Left atrial size was dilated. The mitral valve is dilated. Mitral valve regurgitation is moderate to severe by color flow Doppler. The MR jet is centrally-directed. Pulmonary venous flow shows systolic flow reversal. There is moderate mitral valve regurgitation by PISA  measuring 3.08.  '21 TTE - RV size appears normal but there is severely reduced RV function. Severe RV dysfunction was present on the prior study. Impella present in the LV, 5.6 cm from the AoV. The LV is dilated with septal bowing into the RV. Trace MR. Left ventricular ejection fraction, by estimation, is 10-15%. Global hypokinesis. The left ventricular internal cavity size was moderately dilated. Left atrial size was mildly dilated. Trivial mitral valve regurgitation.     Neuro/Psych PSYCHIATRIC DISORDERS Anxiety Depression Bipolar  Disorder    GI/Hepatic PUD, GERD  Medicated and Controlled,  Endo/Other  diabetes, Type 2, Insulin Dependent Hypocalcemia Obesity   Renal/GU CRF and ARFRenal disease Anuric AKI/CKD stage III- presumably due to ischemic ATN in setting of cardiogenic shock s/p CABG.  Likely ESRD at this point. CRRT started 01/16/20      Musculoskeletal  (+) Arthritis ,  Gout    Abdominal   Peds  Hematology  (+) anemia ,   Anesthesia Other Findings Covid test negative 01/05/2020 (inpatient since)     Reproductive/Obstetrics                            Anesthesia Physical  Anesthesia Plan  ASA: IV  Anesthesia Plan: General   Post-op Pain Management:    Induction: Inhalational  PONV Risk Score and Plan: 2 and Treatment may vary due to age or medical condition  Airway Management Planned: Tracheostomy  Additional Equipment: Arterial line  Intra-op Plan:   Post-operative Plan: Post-operative intubation/ventilation  Informed Consent:     History available from chart only  Plan Discussed with: CRNA and Anesthesiologist  Anesthesia Plan Comments:        Anesthesia Quick Evaluation

## 2020-03-02 NOTE — Progress Notes (Signed)
PT Cancellation Note  Patient Details Name: FINCH COSTANZO MRN: [id number] DOB: December 17, 1970   Cancelled Treatment:    Reason Eval/Treat Not Completed: Medical issues which prohibited therapy; RN reports pt bleeding from abdominal wound.  Will hold for today.   Reginia Naas 03/25/2020, 9:00 AM Magda Kiel, PT Acute Rehabilitation Services [phone] Office:[phone] 03/16/2020

## 2020-03-02 NOTE — Progress Notes (Signed)
RT note- Patient back from OR, remains on current ventilator settings.

## 2020-03-02 NOTE — Progress Notes (Signed)
NAME:  Corey Palmer, MRN:  [id number], DOB:  Dec 20, 1970, LOS: 46 ADMISSION DATE:  12/30/2019, CONSULTATION DATE: 01/15/2020 REFERRING MD: Aundra Dubin CHIEF COMPLAINT: Respiratory failure post Impella  HPI/Course in hospital  50 year old man admitted 12/1 with A. fib/RVR, torsades, cardiogenic shock [EF 15] requiring Impella placement (removed 1/21), CABG x 4 c/b cardiac tamponade requiring chest reopening (now with VAC), AKI requiring on CRRT.  Past Medical History:    has a past medical history of Anginal pain ([place]), Anxiety, Arthritis, Automatic implantable cardioverter-defibrillator in situ (2007), Bipolar disorder ([place]), CAD (coronary artery disease), Cancer ([place]) (2013), CHF (congestive heart failure) ([place]), Chondrocalcinosis of right knee (7/37/1062), Chronic systolic heart failure ([place]), Depression, Diabetes uncomplicated adult-type II, Dilated cardiomyopathy ([place]), Dyslipidemia, Dysrhythmia, GERD (gastroesophageal reflux disease), Gout, unspecified, Hepatic steatosis (2011), History of kidney stones, echocardiogram, Hypertension, Obesity (BMI 30.0-34.9), Pacemaker, Paroxysmal atrial fibrillation ([place]), Peptic ulcer, Shortness of breath, and Sleep apnea.  Significant Hospital Events:  12/2 Afib with RVR. Cardioverted in ED. AICD did not fire. Milrinone for low co ox.  12/7 ICD fired, Torsades- likely from low K of 2.8.  12/8 Cath- multivessel disease w/ low CO. Volume overloaded.  12/11 Underwent Impella 5.5 insertion for persistent symptoms of forward failure with low cardiac indices. 12/12 Back to the OR for displacement of Impella. Extubated.  12/15 CABG x4, with LIMA-LAD, SVG-PDA/PLV, SVG-OM 12/16 Emergently opened chest at bedside for tamponade, clot compressing right atrium  12/16 To OR for Exploration of chest due to bleeding  12/19 Started CRRT 12/23 Sternal Closure in OR 12/24 Worsening shock.  Chest reopened at bedside with improvement in hemodynamics.  No mediastinal  hematoma. 12/25 A flutter > rapid a paced to sinus rhythm.  Remains on high-dose pressors, inotropes 12/28 Weaning vasopressor requirements.  For chest washout today.  Back in atrial fibrillation, refractory to increased amiodarone and attempted overdrive pacing.  DCCV to NSR. Tolerating fluid removal via CRRT. 1/03 Appears euvolemic.  Adjusting glycemic control.  Chest still open so not weaning 1/04 Spiking fever, Vanco meropenem resumed after culture sent from blood and sputum. 1/05 Awaiting return to the OR for wound VAC dressing assessment fever curve and white blood cell count improving after antibiotics resumed the day prior, did have chest closed, developed tamponade physiology shortly after had to go back for emergent reopening of chest, return to intensive care and refractory shock 1/06 Wound VAC dressing replaced.  Still in shock.  Starting to wean sedation 1/07 Still on high-dose pressors, CRRT 1/15 Recurrent Vtach, shocked 1/18 Attempted pressure change to P 3 with addition of Dobutamine . MAP goal > 60 1/19 Ongoing attempts at impella wean, on PS 1/20 Stable on vent, following commands with sedation weaned, continued Impella wean attempt 1/21 Impella removed, on FloTrac, WBC 22K, pancultured, Cefepime/Vanc started empirically for low SVR, concern for sepsis 1/22 antifungals added Impella removed on 1/21, 3 PM CT x3 in place 1/27 OR with plastics for debridement, ACell, partial closure 1/28 plan to stop CRRT when clots off 1/29 CRRT didn't clot off, continues on 1/31 CRRT ongoing, tolerated PSV 16/8 for several hours.    Consults:  PCCM, nephrology, cardiology, cardiothoracic surgery, EP  Procedures:  R PICC 12/2 >> R axillary Impella 5.5 12/11 >> 1/21 ETT 12/15 >> 1/10 R femoral arterial sheath 12/15 >> 12/31 R femoral HD cath 12/19 >> 12/31 Chest tube - right pleural, left pleural , mediastinal 12/23 >> 1/5 R Radial A-Line 1/6 >> R fem HD > Chest tube - L pleural,  R  pleural, mediastinal 1/10 >> 1/30 Trach 1/10 > PEG 1/10 > 03/12/2020 partial closure of sternal wound with placement of wound VAC plastic surgery Significant Diagnostic Tests:  12/8 LHC  Elevated R and L heart filling pressures with low CO, severe 3-vessel disease 1/16 echo > Severe biventricular failure. No significant effusion. Pacing wire in  RV. Markedly abnormal septal motion. Micro Data:  1/19 BCx2 -no growth at 3 days 1/21 OR Cx > staph epi  Antimicrobials:  Levaquin 1/21 (periop ppx) Meropenem 1/21>1/29 Vanc 1/21 >  Anidulafungin 1/22>  1/29  Interval history:  Tolerated pressure support for short-term on 02/29/2020.  He is for possible sternal closure today 03/01/2018  Objective   Blood pressure (!) 87/66, pulse 87, temperature 98.5 F (36.9 C), temperature source Axillary, resp. rate (!) 26, height 6\' 3"  (1.905 m), weight 81.3 kg, SpO2 100 %.    Vent Mode: PRVC FiO2 (%):  [30 %] 30 % Set Rate:  [24 bmp] 24 bmp Vt Set:  [10 mL-510 mL] 510 mL PEEP:  [5 cmH20] 5 cmH20 Pressure Support:  [10 cmH20-14 cmH20] 10 cmH20 Plateau Pressure:  [14 cmH20-27 cmH20] 20 cmH20   Intake/Output Summary (Last 24 hours) at 03/17/2020 1012 Last data filed at 03/17/2020 5621 Gross per 24 hour  Intake 2875.97 ml  Output 2491 ml  Net 384.97 ml   Filed Weights   02/29/20 0500 03/06/2020 0422 03/03/2020 0422  Weight: 81.6 kg 83.7 kg 81.3 kg       Examination:   General: 50-year-old male who looks older than stated age of 50 who is in obvious distress HEENT: JVD or lymphadenopathy is appreciated Neuro: Sedated poorly responsive CV: Heart sounds are distant PULM: Asynchronously vent coarse rhonchi bilaterally Vent pressure regulated volume control FIO2 30% PEEP five RATE 24+5 VT 510 cc  Proximal 100 cc from wound VAC bright red blood.  Noticeable swelling around the margins of upper wound VAC chest incision  GI: PEG tube is in place Extremities: warm/dry, negative edema  Skin: no rashes or  lesions   Assessment & Plan:   Acute hypoxic respiratory failure requiring MV, s/p trach 1/10 Possible HCAP s/p tx with meropenem/eraxis Small R pleural effusion, persistent  P Back on pressure support 03/03/2020 Back back to the OR on 02/29/2020 No weaning from ventilator at this time  Continue pulmonary toilet No further weaning from ventilator until okayed by cardiovascular thoracic surgery    Open sternal wound with partial closure 03/10/2020 03/2020 increased in drainage from sternal wound site with increasing size of suspected hematoma under wound From pulmonary critical care perspective we will increase sedation Decrease level of consciousness to prevent further injury to the sternal wound He may need to go back to the OR per CVTS on 03/01/2020   Cardiogenic shock -s/p impella, removed 1/21 -s/p CABG c/b tamponade and multiple chest re-openings, prolonged open chest  -s/p excision/debridement + ACell application, partial closure with plastics 1/27 P Per advanced heart failure to Dobutamine midodrine May have component of hemorrhagic shock with bleeding from chest wound   MRSE sternal wound infection Continue vancomycin Monitor culture data  Afib RVR, recurrent VT, Torsades -- improved  P Amiodarone Monitoring  Acute renal failure in CKD IIIa requiring RRT Cardiorenal syndrome  Lab Results  Component Value Date   CREATININE 0.88 03/02/2020   CREATININE 1.06 03/23/2020   CREATININE 0.85 03/12/2020   CREATININE 1.13 05/13/2013   CREATININE 0.88 08/03/2010     P Appreciate nephrology's input Question when  he can transition to intermittent hemodialysis   Hyponatremia Recent Labs  Lab 03/19/2020 0425 03/19/2020 1419 02/29/2020 0421  NA 134* 129* 134*    Per renal Continues on CRRT  Anemia Recent Labs    03/27/2020 0421 03/01/2020 0807  HGB 9.5* 8.7*    Transfuse per protocol Siv 2 units of packed cells 03/01/2020 due to bleeding from chest site  DM with  hyperglycemia CBG (last 3)  Recent Labs    03/16/2020 0005 03/24/2020 0428 02/29/2020 0805  GLUCAP 159* 160* 156*    Sliding-scale insulin protocol  Deconditioning Dysphagia At risk malnutrition  Continue dietary consult Physical therapy on hold for now   -  Daily Goals Checklist  Pain/Anxiety/Delirium protocol (if indicated): Precedex, weaning as able VAP protocol (if indicated): yes DVT prophylaxis:  Heparin gtt Nutrition Status: TF GI prophylaxis: Pantoprazole Glucose control: Insulin (Basal, Standing, SSI) Mobility/therapy needs: per primary Code Status: Full  Family Communication: per primary-- palliative also updating  Disposition: ICU  Critical care time 30 mins  Richardson Landry Danyon Mcginness ACNP Acute Care Nurse Practitioner [place] Please consult Amion 03/27/2020, 10:12 AM

## 2020-03-03 ENCOUNTER — Encounter (HOSPITAL_COMMUNITY): Payer: Self-pay | Admitting: Plastic Surgery

## 2020-03-03 ENCOUNTER — Inpatient Hospital Stay (HOSPITAL_COMMUNITY): Payer: BC Managed Care – PPO

## 2020-03-03 DIAGNOSIS — Z66 Do not resuscitate: Secondary | ICD-10-CM | POA: Diagnosis not present

## 2020-03-03 DIAGNOSIS — R570 Cardiogenic shock: Secondary | ICD-10-CM | POA: Diagnosis not present

## 2020-03-03 DIAGNOSIS — R627 Adult failure to thrive: Secondary | ICD-10-CM

## 2020-03-03 DIAGNOSIS — Z992 Dependence on renal dialysis: Secondary | ICD-10-CM

## 2020-03-03 DIAGNOSIS — N186 End stage renal disease: Secondary | ICD-10-CM

## 2020-03-03 DIAGNOSIS — Z7189 Other specified counseling: Secondary | ICD-10-CM | POA: Diagnosis not present

## 2020-03-03 DIAGNOSIS — S20219A Contusion of unspecified front wall of thorax, initial encounter: Secondary | ICD-10-CM

## 2020-03-03 DIAGNOSIS — Z515 Encounter for palliative care: Secondary | ICD-10-CM | POA: Diagnosis not present

## 2020-03-03 DIAGNOSIS — J9601 Acute respiratory failure with hypoxia: Secondary | ICD-10-CM | POA: Diagnosis not present

## 2020-03-03 LAB — RENAL FUNCTION PANEL
Albumin: 1.8 g/dL — ABNORMAL LOW (ref 3.5–5.0)
Albumin: 1.9 g/dL — ABNORMAL LOW (ref 3.5–5.0)
Anion gap: 9 (ref 5–15)
Anion gap: 10 (ref 5–15)
BUN: 44 mg/dL — ABNORMAL HIGH (ref 6–20)
BUN: 50 mg/dL — ABNORMAL HIGH (ref 6–20)
CO2: 22 mmol/L (ref 22–32)
CO2: 23 mmol/L (ref 22–32)
Calcium: 8 mg/dL — ABNORMAL LOW (ref 8.9–10.3)
Calcium: 7.8 mg/dL — ABNORMAL LOW (ref 8.9–10.3)
Chloride: 103 mmol/L (ref 98–111)
Chloride: 102 mmol/L (ref 98–111)
Creatinine, Ser: 1.06 mg/dL (ref 0.61–1.24)
Creatinine, Ser: 1.03 mg/dL (ref 0.61–1.24)
GFR, Estimated: >60 mL/min (ref >60)
GFR, Estimated: >60 mL/min (ref >60)
Glucose, Bld: 139 mg/dL — ABNORMAL HIGH (ref 70–99)
Glucose, Bld: 170 mg/dL — ABNORMAL HIGH (ref 70–99)
Phosphorus: 3.1 mg/dL (ref 2.5–4.6)
Phosphorus: 2.5 mg/dL (ref 2.5–4.6)
Potassium: 4.5 mmol/L (ref 3.5–5.1)
Potassium: 4.3 mmol/L (ref 3.5–5.1)
Sodium: 134 mmol/L — ABNORMAL LOW (ref 135–145)
Sodium: 135 mmol/L (ref 135–145)

## 2020-03-03 LAB — COOXEMETRY PANEL
Carboxyhemoglobin: 2.7 % — ABNORMAL HIGH (ref 0.5–1.5)
Carboxyhemoglobin: 2.4 % — ABNORMAL HIGH (ref 0.5–1.5)
Methemoglobin: 0.8 % (ref 0.0–1.5)
Methemoglobin: 0.8 % (ref 0.0–1.5)
O2 Saturation: 100 %
O2 Saturation: 66.4 %
Total hemoglobin: 9.1 g/dL — ABNORMAL LOW (ref 12.0–16.0)
Total hemoglobin: 7.6 g/dL — ABNORMAL LOW (ref 12.0–16.0)

## 2020-03-03 LAB — CBC WITH DIFFERENTIAL/PLATELET
Abs Immature Granulocytes: 0.19 10*3/uL — ABNORMAL HIGH (ref 0.00–0.07)
Basophils Absolute: 0.1 10*3/uL (ref 0.0–0.1)
Basophils Relative: 0 %
Eosinophils Absolute: 0.2 10*3/uL (ref 0.0–0.5)
Eosinophils Relative: 1 %
HCT: 25.6 % — ABNORMAL LOW (ref 39.0–52.0)
Hemoglobin: 7.9 g/dL — ABNORMAL LOW (ref 13.0–17.0)
Immature Granulocytes: 1 %
Lymphocytes Relative: 8 %
Lymphs Abs: 1.4 10*3/uL (ref 0.7–4.0)
MCH: 27.8 pg (ref 26.0–34.0)
MCHC: 30.9 g/dL (ref 30.0–36.0)
MCV: 90.1 fL (ref 80.0–100.0)
Monocytes Absolute: 1.7 10*3/uL — ABNORMAL HIGH (ref 0.1–1.0)
Monocytes Relative: 9 %
Neutro Abs: 14.3 10*3/uL — ABNORMAL HIGH (ref 1.7–7.7)
Neutrophils Relative %: 81 %
Platelets: 412 10*3/uL — ABNORMAL HIGH (ref 150–400)
RBC: 2.84 MIL/uL — ABNORMAL LOW (ref 4.22–5.81)
RDW: 19.7 % — ABNORMAL HIGH (ref 11.5–15.5)
WBC: 17.8 10*3/uL — ABNORMAL HIGH (ref 4.0–10.5)
nRBC: 0 % (ref 0.0–0.2)

## 2020-03-03 LAB — GLUCOSE, CAPILLARY
Glucose-Capillary: 149 mg/dL — ABNORMAL HIGH (ref 70–99)
Glucose-Capillary: 163 mg/dL — ABNORMAL HIGH (ref 70–99)
Glucose-Capillary: 170 mg/dL — ABNORMAL HIGH (ref 70–99)
Glucose-Capillary: 183 mg/dL — ABNORMAL HIGH (ref 70–99)
Glucose-Capillary: 202 mg/dL — ABNORMAL HIGH (ref 70–99)
Glucose-Capillary: 254 mg/dL — ABNORMAL HIGH (ref 70–99)

## 2020-03-03 LAB — HEMOGLOBIN AND HEMATOCRIT, BLOOD
HCT: 26.4 % — ABNORMAL LOW (ref 39.0–52.0)
Hemoglobin: 8.2 g/dL — ABNORMAL LOW (ref 13.0–17.0)

## 2020-03-03 LAB — PREPARE RBC (CROSSMATCH)

## 2020-03-03 LAB — MAGNESIUM: Magnesium: 2.5 mg/dL — ABNORMAL HIGH (ref 1.7–2.4)

## 2020-03-03 MED ORDER — SODIUM CHLORIDE 0.9% IV SOLUTION: Freq: Once | INTRAVENOUS | Status: AC

## 2020-03-03 MED ORDER — CLONAZEPAM 0.5 MG PO TBDP
1.0000 mg | ORAL_TABLET | Freq: Three times a day (TID) | ORAL | Status: DC
Start: 2020-03-03 — End: 2020-03-08
  Administered 2020-03-03 – 2020-03-07 (×14): 1 mg via ORAL
  Filled 2020-03-03 (×14): qty 2

## 2020-03-03 MED ORDER — ALBUMIN HUMAN 25 % IV SOLN
50.0000 g | Freq: Once | INTRAVENOUS | Status: AC
  Administered 2020-03-03: 50 g via INTRAVENOUS
  Filled 2020-03-03: qty 200

## 2020-03-03 MED ORDER — LORAZEPAM 2 MG/ML IJ SOLN
0.5000 mg | INTRAMUSCULAR | Status: DC
  Administered 2020-03-03 – 2020-03-08 (×26): 0.5 mg via INTRAVENOUS
  Filled 2020-03-03 (×26): qty 1

## 2020-03-03 NOTE — Progress Notes (Signed)
This chaplain responded to PMT referral for F/U spiritual care.  The chaplain's presence was requested after a goals of care meeting with the Pt. wife-Corey Palmer. The chaplain was updated by the Pt. RN-Corey Palmer.  This chaplain listened to [facility] emotionally reflect on the meeting with Dr. Aundra Dubin. The grief associated with the anticipation of the loss of her husband and childrens' father often enters the conversation. In the next sentence, the chaplain  hears Corey Palmer verbalizing the Pt. is suffering and the Pt. would not want the quality of life he is now experiencing in the hospital.   Corey Palmer chooses to reverse the code status to Full Code to allow time for the Pt. family to visit allow her son to make with her. Corey Palmer speaks of her sister in law Hassan Rowan as her source of strength.  The chaplain gave Corey Palmer the opportunity to explore family being together at the Pt. bedside,  if she chooses to transition to comfort measures.    The invitation for F/U spiritual care was accepted by Corey Palmer.

## 2020-03-03 NOTE — Progress Notes (Signed)
Physical Therapy Treatment Patient Details Name: Corey Palmer MRN: [id number] DOB: 1970-04-26 Today's Date: 03/03/2020    History of Present Illness 50 yo male admitted 12/1 with A. fib/RVR, torsades, cardiogenic shock (EF 15) requiring Impella placement (removed 1/21), CABG x 4 c/b cardiac tamponade requiring chest reopening (now with VAC), AKI requiring initiation of CRRT. PMH including anxiety, arthritis, CAD, CHF, DM type ll, HTN, obesity, pacemaker, paroxysmal a-fib, and sleep apnea.    PT Comments    Pt admitted with above diagnosis. Pt again very passive and only moving left UE to command.  P/ROM to other extremities with pt possibly moving left LE some and right UE.  Pt did open eyes to command.  Assisted nurse to postiion pt in better position.  Pt currently with functional limitations due to lethargy and lines limiting pts progress. Pt has met 0/5 goals.  Family meeting next week as pt has made little progress. Will revise goals and await familys decisions in the coming week.  Pt will benefit from skilled PT to increase their independence and safety with mobility to allow discharge to the venue listed below.     Follow Up Recommendations  SNF;Supervision/Assistance - 24 hour (will continue to assess)     Equipment Recommendations   (will continue to access with progression of mobiltiy)    Recommendations for Other Services       Precautions / Restrictions Precautions Precautions: Sternal Precaution Booklet Issued: No (pt not awake enough to comprehend) Precaution Comments: CRRT via R groin line, Vent on 5 of peep, wound vac on chest, continuous O2 at 30% Restrictions Other Position/Activity Restrictions: sternal precautions    Mobility  Bed Mobility Overal bed mobility: Needs Assistance Bed Mobility: Rolling Rolling: +2 for physical assistance;Total assist   Supine to sit: Min assist     General bed mobility comments: repositioned in bed, and pillows placed to  minimize L lean wtih nursing assist  Transfers                 General transfer comment: unable due to CRRT right groin  Ambulation/Gait             General Gait Details: unable   Stairs             Wheelchair Mobility    Modified Rankin (Stroke Patients Only)       Balance Overall balance assessment:  (unable due to R groin CRRT line)                                          Cognition Arousal/Alertness: Suspect due to medications;Lethargic Behavior During Therapy: Flat affect Overall Cognitive Status: Impaired/Different from baseline Area of Impairment: Following commands                   Current Attention Level:  (to lethargic to engaged for longer than a couple of seconds)   Following Commands: Follows one step commands inconsistently       General Comments: Kept eye closed but assisted me with about 25% of ROM to extremties x4      Exercises General Exercises - Upper Extremity Shoulder Flexion: PROM;Both;10 reps;Supine (limited to 60 degrees) Elbow Flexion: PROM;Both;10 reps;Seated Elbow Extension: PROM;Both;10 reps;Supine Wrist Flexion: PROM;10 reps;Supine;Left Wrist Extension: PROM;10 reps;Supine;Left Digit Composite Flexion: PROM;Left;10 reps;Supine Composite Extension: PROM;Left;10 reps;Supine General Exercises - Lower Extremity Ankle Circles/Pumps: PROM;Both;10 reps;Supine Quad  Sets: AAROM;Both;5 reps;Supine Heel Slides: PROM;Left;10 reps;Supine Hip ABduction/ADduction: PROM;Both;5 reps;Supine Other Exercises Other Exercises: P/AAROM to all 4 extremties x 10reps with limting RLE to only hip abduction/adduction and ankle pumps.  He is moving both UEs spontaneously but not purposefully.    General Comments        Pertinent Vitals/Pain Pain Assessment: Faces Faces Pain Scale: Hurts even more Pain Location: left knee flexion Pain Descriptors / Indicators: Grimacing;Guarding Pain Intervention(s): Limited  activity within patient's tolerance;Monitored during session;Repositioned    Home Living                      Prior Function            PT Goals (current goals can now be found in the care plan section) Acute Rehab PT Goals Patient Stated Goal: unstated PT Goal Formulation: Patient unable to participate in goal setting Time For Goal Achievement: 03/17/20 Potential to Achieve Goals: Fair Progress towards PT goals: Not progressing toward goals - comment (lethargy and lines)    Frequency    Min 2X/week      PT Plan Current plan remains appropriate    Co-evaluation              AM-PAC PT "6 Clicks" Mobility   Outcome Measure  Help needed turning from your back to your side while in a flat bed without using bedrails?: Total Help needed moving from lying on your back to sitting on the side of a flat bed without using bedrails?: Total Help needed moving to and from a bed to a chair (including a wheelchair)?: Total Help needed standing up from a chair using your arms (e.g., wheelchair or bedside chair)?: Total Help needed to walk in hospital room?: Total Help needed climbing 3-5 steps with a railing? : Total 6 Click Score: 6    End of Session   Activity Tolerance: Other (comment) (limited by CRRT line and lethargy) Patient left: in bed;with nursing/sitter in room;with call bell/phone within reach Nurse Communication: Mobility status;Need for lift equipment PT Visit Diagnosis: Muscle weakness (generalized) (M62.81)     Time: 1545-1600 PT Time Calculation (min) (ACUTE ONLY): 15 min  Charges:  $Therapeutic Exercise: 8-22 mins                     Josy Peaden W,PT Acute Rehabilitation Services Pager:  [phone]  Office:  [place] 03/03/2020, 5:09 PM

## 2020-03-03 NOTE — Progress Notes (Signed)
1 Day Post-Op  Subjective: 50 year old male with large sternal wound, return to the OR yesterday for bleeding from sternal wound.  Patient remains off heparin at this time. Wound VAC today appears stable, patient is sedated.  RN at bedside  Objective: Vital signs in last 24 hours: Temp:  [96.5 F (35.8 C)-98.6 F (37 C)] 98.3 F (36.8 C) (02/04 0920) Pulse Rate:  [65-102] 93 (02/04 0930) Resp:  [7-31] 21 (02/04 0930) BP: (77-120)/(47-81) 91/68 (02/04 0930) SpO2:  [100 %] 100 % (02/04 0930) Arterial Line BP: (94-149)/(41-62) 115/54 (02/04 0930) FiO2 (%):  [30 %] 30 % (02/04 0920) Weight:  [82.2 kg] 82.2 kg (02/04 0418) Last BM Date: 03/03/20  Intake/Output from previous day: 02/03 0701 - 02/04 0700 In: 3354 [I.V.:1417.7; NG/GT:1401.3] Out: 2759 [Drains:575; Stool:275; Blood:50] Intake/Output this shift: Total I/O In: 208.1 [I.V.:58.1; NG/GT:150] Out: 518 [Other:18; Stool:500]  General appearance: Sedated, no distress, ill-appearing male Throat: Trach tube in place Chest wall: Wound VAC in place over sternum, no appreciable leaks noted.  150 cc of serosanguineous drainage in canister.  No sign of hematoma.  No surrounding erythema. Extremities: SCDs in place   Lab Results:  CBC Latest Ref Rng & Units 03/03/2020 03/21/2020 03/24/2020  WBC 4.0 - 10.5 K/uL 17.8(H) - -  Hemoglobin 13.0 - 17.0 g/dL 7.9(L) 9.5(L) 8.7(L)  Hematocrit 39.0 - 52.0 % 25.6(L) 28.0(L) 30.0(L)  Platelets 150 - 400 K/uL 412(H) - -    BMET Recent Labs    03/15/2020 1601 03/03/20 0408  NA 133* 134*  K 4.8 4.5  CL 103 103  CO2 20* 22  GLUCOSE 221* 139*  BUN 41* 44*  CREATININE 1.20 1.06  CALCIUM 8.2* 8.0*   PT/INR No results for input(s): LABPROT, INR in the last 72 hours. ABG Recent Labs    02/29/2020 1259  PHART 7.370  HCO3 27.3    Studies/Results: DG Chest Port 1 View  Result Date: 03/03/2020 CLINICAL DATA:  Tracheostomy check. EXAM: PORTABLE CHEST 1 VIEW COMPARISON:  March 02, 2020  FINDINGS: The tracheostomy tube is in good position. A right PICC line is stable. The distal tip is difficult to see but appears to terminate in the SVC. The AICD device is stable as well. No pneumothorax. The cardiomediastinal silhouette is stable. Mild atelectasis in the right base. The lungs are otherwise clear. Persistent thickening of the pleural stripe along the right apex laterally is stable. IMPRESSION: 1. Support apparatus as above. 2. Continued thickening of the pleural stripe laterally in the right apex, unchanged, probably loculated fluid. 3. No other changes. Electronically Signed   By: Dorise Bullion III M.D   On: 03/03/2020 08:48   DG Chest Port 1 View  Result Date: 03/06/2020 CLINICAL DATA:  Abnormal respiration EXAM: PORTABLE CHEST 1 VIEW COMPARISON:  03/13/2020 FINDINGS: Tracheostomy tube tip terminates in the mid trachea, 4.8 cm from the carina. Pacer/defibrillator battery pack overlies left chest wall with lead at the cardiac apex. Surgical changes noted in the right axilla correlate for prior vascular cutdown. Right upper extremity PICC tip terminates in the mid SVC. Persistent thickening along the right lung apex may reflect some loculated fluid. No basilar effusions are seen. No new airspace disease. No visible pneumothorax. Stable cardiomediastinal contours with cardiomegaly. No other acute osseous or soft tissue abnormality. IMPRESSION: 1. Tracheostomy and right PICC in stable satisfactory position. 2. Persistent thickening along the right lung apex may reflect some loculated fluid. 3. Low volumes and atelectasis without new airspace disease. Electronically Signed  By: Lovena Le M.D.   On: 03/01/2020 05:52    Anti-infectives: Anti-infectives (From admission, onward)   Start     Dose/Rate Route Frequency Ordered Stop   03/17/2020 1330  ceFAZolin 1 g / gentamicin 80 mg in NS 500 mL surgical irrigation  Status:  Discontinued         Irrigation To Surgery 03/12/2020 1317 03/07/2020 1340    02/20/20 2000  anidulafungin (ERAXIS) 100 mg in sodium chloride 0.9 % 100 mL IVPB        100 mg 78 mL/hr over 100 Minutes Intravenous Every 24 hours 02/19/20 1425 02/25/20 2125   02/19/20 1600  vancomycin (VANCOCIN) IVPB 1000 mg/200 mL premix        1,000 mg 200 mL/hr over 60 Minutes Intravenous Every 24 hours 02/19/20 1056 02/28/20 1725   02/19/20 1600  anidulafungin (ERAXIS) 200 mg in sodium chloride 0.9 % 200 mL IVPB        200 mg 78 mL/hr over 200 Minutes Intravenous  Once 02/19/20 1425 02/19/20 2045   02/19/20 1400  vancomycin (VANCOCIN) IVPB 1000 mg/200 mL premix  Status:  Discontinued        1,000 mg 200 mL/hr over 60 Minutes Intravenous Every 24 hours 02/22/2020 1312 02/11/2020 1633   02/19/20 1400  meropenem (MERREM) 1 g in sodium chloride 0.9 % 100 mL IVPB        1 g 200 mL/hr over 30 Minutes Intravenous Every 8 hours 02/19/20 1056 02/25/20 2211   02/26/2020 1800  meropenem (MERREM) 1 g in sodium chloride 0.9 % 100 mL IVPB  Status:  Discontinued        1 g 200 mL/hr over 30 Minutes Intravenous Every 12 hours 01/29/2020 1620 02/19/20 1056   02/09/2020 1400  ceFEPIme (MAXIPIME) 2 g in sodium chloride 0.9 % 100 mL IVPB  Status:  Discontinued        2 g 200 mL/hr over 30 Minutes Intravenous Every 12 hours 02/17/2020 1312 02/06/2020 1620   02/20/2020 1400  vancomycin (VANCOREADY) IVPB 2000 mg/400 mL        2,000 mg 200 mL/hr over 120 Minutes Intravenous  Once 02/14/2020 1312 02/02/2020 1827   02/10/2020 0859  vancomycin (VANCOCIN) 1,000 mg in sodium chloride 0.9 % 1,000 mL irrigation  Status:  Discontinued          As needed 02/07/2020 0859 02/24/2020 0927   02/10/2020 0800  levofloxacin (LEVAQUIN) IVPB 500 mg        500 mg 100 mL/hr over 60 Minutes Intravenous To Surgery 02/08/2020 0758 02/23/2020 0905   01/29/2020 1115  vancomycin (VANCOREADY) IVPB 1500 mg/300 mL  Status:  Discontinued        1,500 mg 150 mL/hr over 120 Minutes Intravenous NOW 02/17/2020 1103 02/16/2020 1245   02/17/2020 1400  vancomycin  (VANCOCIN) IVPB 1000 mg/200 mL premix  Status:  Discontinued        1,000 mg 200 mL/hr over 60 Minutes Intravenous Every 24 hours 02/01/20 1011 02/09/20 0957   02/01/20 1400  meropenem (MERREM) 1 g in sodium chloride 0.9 % 100 mL IVPB  Status:  Discontinued        1 g 200 mL/hr over 30 Minutes Intravenous Every 8 hours 02/01/20 1011 02/09/20 0957   02/01/20 1100  vancomycin (VANCOREADY) IVPB 2000 mg/400 mL        2,000 mg 200 mL/hr over 120 Minutes Intravenous  Once 02/01/20 1011 02/01/20 1327   01/28/20 1300  vancomycin (VANCOCIN) IVPB 1000 mg/200  mL premix  Status:  Discontinued        1,000 mg 200 mL/hr over 60 Minutes Intravenous Every 24 hours 01/27/20 1430 01/28/20 0830   01/24/20 1300  vancomycin (VANCOCIN) IVPB 1000 mg/200 mL premix  Status:  Discontinued        1,000 mg 200 mL/hr over 60 Minutes Intravenous Every 24 hours 01/24/20 0846 01/27/20 1430   01/22/20 1601  vancomycin variable dose per unstable renal function (pharmacist dosing)  Status:  Discontinued         Does not apply See admin instructions 01/22/20 1601 01/24/20 0846   01/21/20 1200  vancomycin (VANCOREADY) IVPB 1250 mg/250 mL  Status:  Discontinued        1,250 mg 166.7 mL/hr over 90 Minutes Intravenous Every 24 hours 01/22/2020 1620 01/22/20 1457   01/18/2020 1800  meropenem (MERREM) 1 g in sodium chloride 0.9 % 100 mL IVPB  Status:  Discontinued        1 g 200 mL/hr over 30 Minutes Intravenous Every 8 hours 12/30/2019 1800 01/28/20 0830   01/19/2020 1348  vancomycin (VANCOCIN) 1,000 mg in sodium chloride 0.9 % 1,000 mL irrigation  Status:  Discontinued          As needed 01/24/2020 1349 01/22/2020 1611   01/07/2020 1015  vancomycin (VANCOREADY) IVPB 1500 mg/300 mL        1,500 mg 150 mL/hr over 120 Minutes Intravenous On call to O.R. 01/16/2020 0927 01/06/2020 1556   01/19/20 1700  vancomycin (VANCOREADY) IVPB 1250 mg/250 mL  Status:  Discontinued        1,250 mg 166.7 mL/hr over 90 Minutes Intravenous Every 24 hours 01/18/20  1618 01/10/2020 1620   01/16/20 1700  vancomycin (VANCOREADY) IVPB 1500 mg/300 mL  Status:  Discontinued        1,500 mg 150 mL/hr over 120 Minutes Intravenous Every 24 hours 01/16/20 1121 01/18/20 1618   01/15/20 1700  vancomycin (VANCOREADY) IVPB 750 mg/150 mL  Status:  Discontinued        750 mg 150 mL/hr over 60 Minutes Intravenous Every 12 hours 01/15/20 1107 01/16/20 1121   01/25/2020 1535  vancomycin (VANCOCIN) powder  Status:  Discontinued          As needed 01/07/2020 1536 01/09/2020 1619   01/14/2020 1400  meropenem (MERREM) 1 g in sodium chloride 0.9 % 100 mL IVPB  Status:  Discontinued        1 g 200 mL/hr over 30 Minutes Intravenous Every 8 hours 01/02/2020 1007 01/12/2020 1800   01/03/2020 1400  levofloxacin (LEVAQUIN) IVPB 500 mg  Status:  Discontinued        500 mg 100 mL/hr over 60 Minutes Intravenous To Surgery 01/25/2020 1354 01/05/2020 1619   01/02/2020 1345  vancomycin (VANCOREADY) IVPB 1500 mg/300 mL  Status:  Discontinued        1,500 mg 150 mL/hr over 120 Minutes Intravenous To Surgery 01/14/2020 1340 01/01/2020 1619   01/25/2020 1345  cefUROXime (ZINACEF) 1.5 g in sodium chloride 0.9 % 100 mL IVPB  Status:  Discontinued        1.5 g 200 mL/hr over 30 Minutes Intravenous To Surgery 01/14/2020 1340 01/23/2020 1353   12/31/2019 1345  cefUROXime (ZINACEF) 750 mg in sodium chloride 0.9 % 100 mL IVPB  Status:  Discontinued        750 mg 200 mL/hr over 30 Minutes Intravenous To Surgery 01/12/2020 1340 01/19/2020 1353   01/24/2020 1002  vancomycin (VANCOCIN) IVPB 1000 mg/200 mL  premix  Status:  Discontinued        1,000 mg 200 mL/hr over 60 Minutes Intravenous Every 12 hours 01/06/2020 0948 01/15/20 1049   01/07/2020 1000  levofloxacin (LEVAQUIN) IVPB 750 mg        750 mg 100 mL/hr over 90 Minutes Intravenous Every 24 hours 01/09/2020 1512 01/03/2020 1900   01/05/2020 2130  vancomycin (VANCOCIN) IVPB 1000 mg/200 mL premix        1,000 mg 200 mL/hr over 60 Minutes Intravenous  Once 01/07/2020 1512 01/25/2020 1718    12/30/2019 1018  vancomycin (VANCOCIN) powder  Status:  Discontinued          As needed 01/06/2020 1019 01/23/2020 1511   01/11/2020 0400  vancomycin (VANCOREADY) IVPB 1500 mg/300 mL        1,500 mg 150 mL/hr over 120 Minutes Intravenous To Surgery 01/11/20 1357 01/16/2020 1100   01/19/2020 0400  levofloxacin (LEVAQUIN) IVPB 500 mg        500 mg 100 mL/hr over 60 Minutes Intravenous To Surgery 01/11/20 1357 01/01/2020 1000      Assessment/Plan: s/p Procedure(s): STERNAL WOUND DEBRIDEMENT WOUND EXPLORATION APPLICATION OF WOUND VAC  We will continue to monitor, no current plans for return to the OR at this time. Will reevaluate next week     LOS: 64 days    Charlies Constable, PA-C 03/03/2020

## 2020-03-03 NOTE — Progress Notes (Signed)
Patient ID: Corey Palmer, male   DOB: 03/18/1970, 50 y.o.   MRN: [id number]     Advanced Heart Failure Rounding Note  PCP-Cardiologist: No primary care provider on file.   Subjective:    - 12/7 Torsades -ICD shock x1.  - 12/11 Impella 5.5 placed.  - 12/12 Impella repositioned in OR - 12/15 CABG x 4 with LIMA-LAD, SVG-PDA/PLV, SVG-OM - 12/16 DCCV afib.  Back to OR to re-open chest and again to evacuate hematoma.  - 12/19 CVVH initiated  - 12/23 Chest closed - 12/24 Chest reopened - 12/26 RAP back into NSR - 12/27 back in atrial fibrillation, unable to rapid atrial pace out.   - 12/28 To OR, chest partially closed and wound vac placed.  DCCV to NSR.  - 12/31 Antibiotics stopped. Swan removed, HD catheter moved from right femoral to right IJ.  - 1/5 To OR for chest closure. Chest reopened emergently at bedside again to evacuate hematoma  - 1/10 To OR: omental flap closure of chest with wound vac, tracheostomy, G-tube, J-tube.    - 1/12 Plastics and EP consulted.  - 1/13 Palliative Care consulted.  - 1/15 Went into VT, Shock 200 J and amiodarone bolus 150.   - 1/16: Milrinone stopped.    - 1/17: Partial closure of chest in OR - 1/18 Switched to dobutamine 4 mcg - 1/21 Impella Extracted.  Developed hyperkalemia when CVVH held with wide QRS rhythm and required transient external pacing.   - 1/27 OR for wound vac change, partial chest wall closure.  - 2/2 S/P sternal debridement , closure part of sternal wound, A cell  - 2/3 Return to OR for re-exploration of sternal wound due to bleeding.   Remains on dobutamine 3 mcg/kg/min but back on NE 12 with hypotension post-OR yesterday (and blood loss).  CVVH running even.  CVP not set up.  Co-ox 66%.    No further bleeding at wound vac.  He remains off heparin gtt for now. Hgb 7.9 today.    Objective:   Weight Range: 82.2 kg Body mass index is 22.65 kg/m.   Vital Signs:   Temp:  [96.5 F (35.8 C)-98.6 F (37 C)] 98.6 F (37 C)  (02/04 0400) Pulse Rate:  [65-102] 87 (02/04 0745) Resp:  [7-31] 13 (02/04 0745) BP: (77-120)/(35-81) 85/60 (02/04 0730) SpO2:  [100 %] 100 % (02/04 0745) Arterial Line BP: (94-149)/(41-62) 115/53 (02/04 0745) FiO2 (%):  [30 %] 30 % (02/04 0400) Weight:  [82.2 kg] 82.2 kg (02/04 0418) Last BM Date: 03/21/2020  Weight change: Filed Weights   03/25/2020 0422 03/26/2020 0422 03/03/20 0418  Weight: 83.7 kg 81.3 kg 82.2 kg    Intake/Output:   Intake/Output Summary (Last 24 hours) at 03/03/2020 0813 Last data filed at 03/03/2020 0800 Gross per 24 hour  Intake 3355.16 ml  Output 3135 ml  Net 220.16 ml      Physical Exam  General: NAD, sedated Neck: Tracheostomy. No JVD, no thyromegaly or thyroid nodule.  Lungs: Clear to auscultation bilaterally with normal respiratory effort. CV: Wound vac on chest.  Heart regular S1/S2, no S3/S4, no murmur.  No peripheral edema.   Abdomen: Soft, nontender, no hepatosplenomegaly, no distention.  Skin: Intact without lesions or rashes.  Neurologic: Will wake up and follow commands per nursing.  Extremities: No clubbing or cyanosis.  HEENT: Normal.     Telemetry   SR 80s (personally reviewed)  Labs    CBC Recent Labs    03/15/2020 0421 03/03/2020  0929 03/18/2020 1259 03/03/20 0408  WBC 15.2*  --   --  17.8*  NEUTROABS 12.5*  --   --  14.3*  HGB 9.5*   < > 9.5* 7.9*  HCT 32.7*   < > 28.0* 25.6*  MCV 91.6  --   --  90.1  PLT 411*  --   --  412*   < > = values in this interval not displayed.   Basic Metabolic Panel Recent Labs    03/23/2020 0421 03/05/2020 1259 03/05/2020 1601 03/03/20 0408  NA 134*   < > 133* 134*  K 4.2   < > 4.8 4.5  CL 101  --  103 103  CO2 25  --  20* 22  GLUCOSE 161*  --  221* 139*  BUN 35*  --  41* 44*  CREATININE 0.88  --  1.20 1.06  CALCIUM 8.2*  --  8.2* 8.0*  MG 2.5*  --   --  2.5*  PHOS 2.1*  --  4.6 3.1   < > = values in this interval not displayed.   Liver Function Tests Recent Labs    03/09/2020 1601  03/03/20 0408  ALBUMIN 1.9* 1.8*   No results for input(s): LIPASE, AMYLASE in the last 72 hours. Cardiac Enzymes No results for input(s): CKTOTAL, CKMB, CKMBINDEX, TROPONINI in the last 72 hours.  BNP: BNP (last 3 results) Recent Labs    01/15/2020 1619  BNP 577.5*    ProBNP (last 3 results) No results for input(s): PROBNP in the last 8760 hours.   D-Dimer No results for input(s): DDIMER in the last 72 hours. Hemoglobin A1C No results for input(s): HGBA1C in the last 72 hours. Fasting Lipid Panel No results for input(s): CHOL, HDL, LDLCALC, TRIG, CHOLHDL, LDLDIRECT in the last 72 hours. Thyroid Function Tests No results for input(s): TSH, T4TOTAL, T3FREE, THYROIDAB in the last 72 hours.  Invalid input(s): FREET3  Other results:   Imaging    No results found.   Medications:     Scheduled Medications: . sodium chloride   Intravenous Once  . sodium chloride   Intravenous Once  . amiodarone  200 mg Per Tube BID  . vitamin C  500 mg Per Tube TID  . aspirin  324 mg Per Tube Daily  . atorvastatin  80 mg Per Tube Daily  . busPIRone  10 mg Per Tube TID  . chlorhexidine gluconate (MEDLINE KIT)  15 mL Mouth Rinse BID  . Chlorhexidine Gluconate Cloth  6 each Topical Daily  . clonazepam  1 mg Oral QHS  . darbepoetin (ARANESP) injection - NON-DIALYSIS  100 mcg Subcutaneous Q Sat-1800  . docusate  100 mg Per Tube BID  . feeding supplement (PROSource TF)  45 mL Per Tube TID  . fentaNYL (SUBLIMAZE) injection  50 mcg Intravenous Once  . fiber  1 packet Per Tube BID  . FLUoxetine  40 mg Per Tube BID  . HYDROmorphone  4 mg Per Tube Q6H  . insulin aspart  0-9 Units Subcutaneous Q4H  . insulin aspart  4 Units Subcutaneous Q4H  . insulin detemir  12 Units Subcutaneous Q12H  . LORazepam  1 mg Intravenous Q4H  . mouth rinse  15 mL Mouth Rinse 10 times per day  . melatonin  5 mg Per Tube Daily  . mexiletine  150 mg Per Tube Q8H  . midodrine  20 mg Per Tube TID WC  .  multivitamin  1 tablet Per Tube QHS  .  nutrition supplement (JUVEN)  1 packet Per Tube BID BM  . pantoprazole (PROTONIX) IV  40 mg Intravenous Q12H  . polyethylene glycol  17 g Per Tube Daily  . sodium chloride flush  10-40 mL Intracatheter Q12H  . thiamine  100 mg Per Tube Daily  . valproic acid  250 mg Per Tube BID    Infusions: .  prismasol BGK 4/2.5 500 mL/hr at 03/19/2020 1204  .  prismasol BGK 4/2.5 200 mL/hr at 03/11/2020 1539  . sodium chloride Stopped (03/18/2020 0753)  . sodium chloride    . dexmedetomidine (PRECEDEX) IV infusion 0.5 mcg/kg/hr (03/03/20 0800)  . dextrose Stopped (03/23/2020 1520)  . DOBUTamine 3 mcg/kg/min (03/03/20 0800)  . feeding supplement (PIVOT 1.5 CAL) 75 mL/hr at 03/03/20 0600  . fentaNYL infusion INTRAVENOUS 50 mcg/hr (03/03/20 0800)  . lactated ringers Stopped (02/29/20 0835)  . norepinephrine (LEVOPHED) Adult infusion 13 mcg/min (03/03/20 0800)  . prismasol BGK 4/2.5 1,500 mL/hr at 02/29/2020 1821    PRN Medications: sodium chloride, Place/Maintain arterial line **AND** sodium chloride, artificial tears, bisacodyl **OR** bisacodyl, dextrose, docusate, fentaNYL, heparin, heparin, lip balm, loperamide HCl, metoprolol tartrate, midazolam, nystatin, polyethylene glycol, sodium chloride flush     Assessment/Plan   1. Cardiogenic shock/acute on chronic systolic CHF: Initially nonischemic cardiomyopathy.  Possible familial cardiomyopathy as both parents had cardiomyopathy and died at around 78. However, [employer] gene testing did not show any common mutation for cardiomyopathy.  However, this admission noted to have severe 3 vessel disease so suspect component of ischemic cardiomyopathy.  St Jude ICD.  Echo in 8/20 with EF 15% and mildly decreased RV function. Echo this admission with EF < 20%, moderate LV dilation, RV mildly reduced, severe LAE, no significant MR. Low output HF with markedly low EF.  He has a long history of cardiomyopathy (20 yrs), tends to  minimize symptoms.  Cardiorenal syndrome with creatinine up to 2.2,  stabilized on milrinone and Impella 5.5. SCr 1.44 day of CABG. CABG 12/15.  Post-op shock, back to OR 12/16 to open chest (chest wall compartment syndrome) and again to evacuate hematoma.  TEE post-op with severe RV dysfunction.  CVVHD initiated 12/19 for volume removal in setting of rising Scr/decreased UOP. Suspect ATN. S/p chest closure 12/23. Hemodynamics much worse on 12/24 with rising pressor demands. Co-ox 36%.  Bedside echo severe biventricular dysfunction with marked septal bounce. Chest re-opened 12/24.  Was atrial paced back to NSR on 12/26 with improvement in hemodynamics but back in atrial fibrillation on 12/27 and unable to pace out, DCCV to NSR on 12/28. Went back to OR 1/5 for chest closure but several hours later required emergent re-opening of chest at bedside to evacuate hematoma. To OR again 1/10 with closure of chest with omental flap with G & J tube.  1/17 partial closure. 1/21 Impella removed.  Off CVVH post-removal, he developed hyperkalemia and wide QRS rhythm requiring transient external pacing, also with suspected septic shock (low SVR, fever, rising WBCs).  Back to OR yesterday for bleeding at chest wall wound vac.  This has resolved, but since then MAP has been lower and he has been back on NE 12 in addition to dobutamine 3.  CVP not set up today, CVVH running even.  Co-ox 66%.  - Can wean NE for SBP > 95. Continue midodrine.   - Continue dobutamine to 3. - Run CVVH even today with soft BP and will set CVP up again.   - Not candidate to replace Impella (at  this point, not candidate for LVAD or transplant).  2. Atrial fibrillation: H/o PAF.  He was on dofetilide in the remote past but this was stopped due to noncompliance.  He had an upper GI bleed from antral ulcers in 3/12. He was seen by GI and was deemed safe to restart anticoagulation as long as he remains on a PPI.  No apparent recurrence of AF until just  prior to this admission, was cardioverted back to NSR in ER but went back into atrial fibrillation.  Post-op afib, DCCV to NSR/BiV pacing am 12/16 -> back to AF. Rapid atrial pacing back to SR. Remains in NSR - Continue amiodarone per tube 200 mg bid.  - Heparin held with chest wall bleeding.   3. CAD:   Cath this admission with severe 3 vessel disease. CABG x 4 on 12/15.  - Continue atorvastatin, ASA.  4. Acute on chronic hypoxemic respiratory failure: Vent per CCM.  Has tracheostomy. Has been getting PSV trials. - Trial trach collar eventually per CCM.  5. AKI on CKD stage 3: Likely ATN/cardiorenal. Anuric. On CVVH.    - Unfortunately, MAP lower and not ready to transition to iHD yet.   6. Diabetes: Insulin.  7. Hyponatremia: Resolved with CVVH.  8. Torsades/ICD shock: 12/7/212 in hospital event, in setting of severe hypokalemia and hypomagnesemia.   9. Gout: h/o gout. Complained of rt knee pain c/w previous flares - treated w/ prednisone burst (completed).  10: ID: Septic shock 1/21 with fever, low SVR, and elevated WBCs. Cultures no growth with exception of axillary cx on 1/21 grew staph epidermidis.  IJ catheter removed. Completed hydrocortisone.  - Off abx. 11. FEN: c/w TFs.  12. Anemia: Hgb 7.9 today.  Transfuse today.   13. Stage II Pressure Ulcer- sacrum: Continue to reposition every 2 hours R/L  14. Pleural Effusion: Loculated rt apical pleural effusion noted on imaging. Continue UF through CVVH.  PCCM following  15. VT: Amiodarone + mexiletine.   16. Deconditioning: Profound. Work with PT, nursing to work on range of motion.   Need family meeting with all specialties and wife, will ask palliative care to arrange.  He is still not ready to transition to iHD and chest wall is not healing.   At the least, need to strongly consider DNR (have discussed before with his wife, she has been considering).  I am concerned that he will not make it to iHD.  Need to push attempt when his BP is  more stable.   CRITICAL CARE Performed by: Loralie Champagne  Total critical care time: 40 minutes  Critical care time was exclusive of separately billable procedures and treating other patients.  Critical care was necessary to treat or prevent imminent or life-threatening deterioration.  Critical care was time spent personally by me on the following activities: development of treatment plan with patient and/or surrogate as well as nursing, discussions with consultants, evaluation of patient's response to treatment, examination of patient, obtaining history from patient or surrogate, ordering and performing treatments and interventions, ordering and review of laboratory studies, ordering and review of radiographic studies, pulse oximetry and re-evaluation of patient's condition.  Loralie Champagne 03/03/2020 8:13 AM

## 2020-03-03 NOTE — Progress Notes (Signed)
Earlier this afternoon, Dr. Aundra Dubin and Sharyn Lull from [place] [facility] met with patient's wife, Corey Palmer, regarding goals of care. During this discussion, Corey Palmer agreed to making the patient a DNR. However, while Corey Palmer was discussing with the chaplain and this nurse at bedside afterwards, she expressed that she wanted to hold off on the DNR until the patient's son can fly out from [place]. She would like all of their children to be local so they can arrange to visit when the patient goes on comfort care. Corey Palmer says that her son is planning to fly out next Tuesday, and expressed that she would like the patient to remain a full code until then. This RN notified Dr. Tamala Julian (PCCM), Tacey Ruiz NP (PMT), and Lyda Jester PA-C (HF). Full code status order entered and DNR order discontinued.

## 2020-03-03 NOTE — Op Note (Signed)
DATE OF OPERATION: 03/03/2020  LOCATION: [facility] Inpatient  PREOPERATIVE DIAGNOSIS: sternal wound hematoma  POSTOPERATIVE DIAGNOSIS: Same  PROCEDURE: Evacuation of sternal wound hematoma  SURGEON: Lyndee Leo Sanger Akire Rennert, DO  COSURGEON:  Murvin Natal, MD  EBL: 150 cc  CONDITION: Stable  COMPLICATIONS: None  INDICATION: The patient, Corey Palmer, is a 50 y.o. male born on [birth year], is here for treatment of a sternal wound hematoma.   PROCEDURE DETAILS:  The patient was seen prior to surgery and marked.  The IV antibiotics were given. The patient was taken to the operating room and given a general anesthetic. A standard time out was performed and all information was confirmed by those in the room. SCDs were placed.   The patient was prepped and draped.  There was at least 150 cc of hematoma.  He seemed to be bleeding from his PEG site as well.  The hematoma was evacuated.  There was no clear area of active bleeding.  The omental flap is in place and appears to be viable.  Copious amounts of irrigation was used to irrigate the wound.  Hemoblast was used and the area covered with a moistened gauze.  The right and then the black VAC sponge was placed in the wound with an excellent seal.  The patient was allowed to wake up and taken to recovery room in stable condition at the end of the case. The family was notified at the end of the case.

## 2020-03-03 NOTE — Progress Notes (Signed)
NAME:  Corey Palmer, MRN:  [id number], DOB:  1970/02/07, LOS: 57 ADMISSION DATE:  01/22/2020, CONSULTATION DATE: 01/19/2020 REFERRING MD: Aundra Dubin CHIEF COMPLAINT: Respiratory failure post Impella  HPI/Course in hospital  50 year old man admitted 12/1 with A. fib/RVR, torsades, cardiogenic shock [EF 15] requiring Impella placement (removed 1/21), CABG x 4 c/b cardiac tamponade requiring chest reopening (now with VAC), AKI requiring on CRRT.  Past Medical History:    has a past medical history of Anginal pain ([place]), Anxiety, Arthritis, Automatic implantable cardioverter-defibrillator in situ (2007), Bipolar disorder ([place]), CAD (coronary artery disease), Cancer ([place]) (2013), CHF (congestive heart failure) ([place]), Chondrocalcinosis of right knee (05/06/8117), Chronic systolic heart failure ([place]), Depression, Diabetes uncomplicated adult-type II, Dilated cardiomyopathy ([place]), Dyslipidemia, Dysrhythmia, GERD (gastroesophageal reflux disease), Gout, unspecified, Hepatic steatosis (2011), History of kidney stones, echocardiogram, Hypertension, Obesity (BMI 30.0-34.9), Pacemaker, Paroxysmal atrial fibrillation ([place]), Peptic ulcer, Shortness of breath, and Sleep apnea.  Significant Hospital Events:  12/2 Afib with RVR. Cardioverted in ED. AICD did not fire. Milrinone for low co ox.  12/7 ICD fired, Torsades- likely from low K of 2.8.  12/8 Cath- multivessel disease w/ low CO. Volume overloaded.  12/11 Underwent Impella 5.5 insertion for persistent symptoms of forward failure with low cardiac indices. 12/12 Back to the OR for displacement of Impella. Extubated.  12/15 CABG x4, with LIMA-LAD, SVG-PDA/PLV, SVG-OM 12/16 Emergently opened chest at bedside for tamponade, clot compressing right atrium  12/16 To OR for Exploration of chest due to bleeding  12/19 Started CRRT 12/23 Sternal Closure in OR 12/24 Worsening shock.  Chest reopened at bedside with improvement in hemodynamics.  No mediastinal  hematoma. 12/25 A flutter > rapid a paced to sinus rhythm.  Remains on high-dose pressors, inotropes 12/28 Weaning vasopressor requirements.  For chest washout today.  Back in atrial fibrillation, refractory to increased amiodarone and attempted overdrive pacing.  DCCV to NSR. Tolerating fluid removal via CRRT. 1/03 Appears euvolemic.  Adjusting glycemic control.  Chest still open so not weaning 1/04 Spiking fever, Vanco meropenem resumed after culture sent from blood and sputum. 1/05 Awaiting return to the OR for wound VAC dressing assessment fever curve and white blood cell count improving after antibiotics resumed the day prior, did have chest closed, developed tamponade physiology shortly after had to go back for emergent reopening of chest, return to intensive care and refractory shock 1/06 Wound VAC dressing replaced.  Still in shock.  Starting to wean sedation 1/07 Still on high-dose pressors, CRRT 1/15 Recurrent Vtach, shocked 1/18 Attempted pressure change to P 3 with addition of Dobutamine . MAP goal > 60 1/19 Ongoing attempts at impella wean, on PS 1/20 Stable on vent, following commands with sedation weaned, continued Impella wean attempt 1/21 Impella removed, on FloTrac, WBC 22K, pancultured, Cefepime/Vanc started empirically for low SVR, concern for sepsis 1/22 antifungals added Impella removed on 1/21, 3 PM CT x3 in place 1/27 OR with plastics for debridement, ACell, partial closure 1/28 plan to stop CRRT when clots off 1/29 CRRT didn't clot off, continues on 1/31 CRRT ongoing, tolerated PSV 16/8 for several hours.    Consults:  PCCM, nephrology, cardiology, cardiothoracic surgery, EP  Procedures:  R PICC 12/2 >> R axillary Impella 5.5 12/11 >> 1/21 ETT 12/15 >> 1/10 R femoral arterial sheath 12/15 >> 12/31 R femoral HD cath 12/19 >> 12/31 Chest tube - right pleural, left pleural , mediastinal 12/23 >> 1/5 R Radial A-Line 1/6 >> R fem HD > Chest tube - L pleural,  R  pleural, mediastinal 1/10 >> 1/30 Trach 1/10 > PEG 1/10 > 03/13/2020 partial closure of sternal wound with placement of wound VAC plastic surgery Significant Diagnostic Tests:  12/8 LHC  Elevated R and L heart filling pressures with low CO, severe 3-vessel disease 1/16 echo > Severe biventricular failure. No significant effusion. Pacing wire in  RV. Markedly abnormal septal motion. Micro Data:  1/19 BCx2 -no growth at 3 days 1/21 OR Cx > staph epi  Antimicrobials:  Levaquin 1/21 (periop ppx) Meropenem 1/21>1/29 Vanc 1/21 > off Anidulafungin 1/22>  1/29  Interval history:  Obviously uncomfortable on pressure support ventilation  Objective   Blood pressure (!) 84/58, pulse 87, temperature 98.1 F (36.7 C), temperature source Axillary, resp. rate 18, height 6\' 3"  (1.905 m), weight 82.2 kg, SpO2 100 %. CVP:  [4 mmHg] 4 mmHg  Vent Mode: PSV;CPAP FiO2 (%):  [30 %] 30 % Set Rate:  [24 bmp] 24 bmp Vt Set:  [510 mL] 510 mL PEEP:  [5 cmH20] 5 cmH20 Pressure Support:  [10 cmH20] 10 cmH20 Plateau Pressure:  [13 cmH20-26 cmH20] 16 cmH20   Intake/Output Summary (Last 24 hours) at 03/03/2020 1052 Last data filed at 03/03/2020 1000 Gross per 24 hour  Intake 3352.99 ml  Output 2914 ml  Net 438.99 ml   Filed Weights   03/22/2020 0422 03/14/2020 0422 03/03/20 0418  Weight: 83.7 kg 81.3 kg 82.2 kg   CVP:  [4 mmHg] 4 mmHg   Examination:   General: Frail ill-appearing male is somewhat confused on pressure support ventilation 10/5 40% with obvious asynchrony with vent HEENT: MM pink/moist colostomy is in place Neuro: Somewhat agitated when not sedated CV: Heart sounds are distant PULM: Coarse rhonchi bilaterally GI: soft, bsx4 active tube feedings Chest dressings intact wound VAC with minimal serous bloody drainage Extremities: warm/dry, negative edema  Skin: no rashes or lesions    Assessment & Plan:   Acute hypoxic respiratory failure requiring MV, s/p trach 1/10 Possible HCAP s/p tx  with meropenem/eraxis Small R pleural effusion, persistent  P Wean from ventilator as able Assess for possible trach collar trial today 03/03/2020    Open sternal wound with partial closure 03/15/2020 03/2020 increased in drainage from sternal wound site with increasing size of suspected hematoma under wound Taken to the OR 03/05/2020 per CVTS Stabilized return to unit transfused x2 units packed DVT   Cardiogenic shock -s/p impella, removed 1/21 -s/p CABG c/b tamponade and multiple chest re-openings, prolonged open chest  -s/p excision/debridement + ACell application, partial closure with plastics 1/27 P  Per cardiology Continue dobutamine Midodrine Levophed      MRSE sternal wound infection Off antibiotics Continue to monitor  Afib RVR, recurrent VT, Torsades -- improved  P Amiodarone Cardiac monitoring  Acute renal failure in CKD IIIa requiring RRT Cardiorenal syndrome  Lab Results  Component Value Date   CREATININE 1.06 03/03/2020   CREATININE 1.20 03/26/2020   CREATININE 0.88 03/12/2020   CREATININE 1.13 05/13/2013   CREATININE 0.88 08/03/2010     P Proceed nephrology's input Questionable to when he can transition to intermittent hemodialysis   Hyponatremia Recent Labs  Lab 03/14/2020 1259 03/03/2020 1601 03/03/20 0408  NA 135 133* 134*    Per renal Remains on CRRT  Anemia Recent Labs    03/20/2020 1259 03/03/20 0408  HGB 9.5* 7.9*    Transfuse per protocol No acute bleeding episodes noted  DM with hyperglycemia CBG (last 3)  Recent Labs    03/03/20 0035  03/03/20 0359 03/03/20 0730  GLUCAP 254* 149* 183*    Sliding-scale insulin protocol  Deconditioning Dysphagia At risk malnutrition   Tube feedings  -  Daily Goals Checklist  Pain/Anxiety/Delirium protocol (if indicated): Precedex, weaning as able VAP protocol (if indicated): yes DVT prophylaxis:  Heparin gtt Nutrition Status: TF GI prophylaxis: Pantoprazole Glucose  control: Insulin (Basal, Standing, SSI) Mobility/therapy needs: per primary Code Status: Full  Family Communication: per primary-- palliative also updating  Disposition: ICU  Critical care time 30 mins  Richardson Landry Corey Palmer ACNP Acute Care Nurse Practitioner [place] Please consult Amion 03/03/2020, 10:52 AM

## 2020-03-03 NOTE — Progress Notes (Signed)
Patient ID: ANTERO DEROSIA, male   DOB: 12/30/1970, 50 y.o.   MRN: [id number]  This NP  patient continues to reach out and talk with wife/ [name] by telephone and when present at bedsdie, for palliative medicine  needs and emotional support.    Spiritual care with PMT also offering support to family.  Today I spoke to [place] by telephone   Education regarding his complex medical situation. Therapeutic listening and emotional support ongoing.  She understands the seriousness of her husband's medical situation, at this time she remains optimistic for improvement and next steps of transition to an LTAC.  She does tell me that she has "not heard from any doctor since before going to the OR yesterday  but the nurses are updating me"  She hears from me my worry regarding the limitations of medical interventions to prolong life when a body fails to thrive.    Education offered to  wife the importance of continued conversation with her family and the  medical providers regarding overall plan of care and treatment options,  ensuring decisions are within the context of the patients values and GOCs.   PMT received a call requesting that a family meeting be set up "with all specialists". Unfortunately, I am not working today and the team's  bandwidth is limited.   Spoke with Redmond Baseman NP about the importance of all providers  expressing their expert opions and recommendation for Mr Schrom in this difficult situation , directly with his family, and on a continual basis giving them clear  information in order to make informed decisions.  However I am trying to see if a PMT provider can  provide support with Dr Aundra Dubin at the bedside, for ongoing family conversation regarding medical situation.  Hoping to set something up for 1:00pm.  Questions and concerns addressed   Total time spent on the unit was 35 minutes  PMT will continue to support holistically Greater than 50% of the time was spent in counseling  and coordination of care  Wadie Lessen NP  Palliative Medicine Team Team Phone # [phone] Pager [phone]

## 2020-03-03 NOTE — Progress Notes (Signed)
Patient ID: Corey Palmer, male   DOB: March 25, 1970, 50 y.o.   MRN: [id number] [facility] Progress Note   Assessment/ Plan:   1. Acute kidney Injury on chronic kidney disease stage III: By definition-now end-stage renal disease given prolonged dialysis dependency (on CRRT since 01/16/2020).  Etiology suspected to be ischemic ATN in the setting of cardiogenic shock status post CABG.  Continue CRRT at this time for regulation of multiple metabolic abnormalities and clearance of azotemia and anuric state.  Total daily input is about 3.5 L a day. 2.  Coronary artery disease status post CABG: Complicated by cardiogenic shock postoperative hematoma requiring reexploration and secondary closure.  Then again required reexploration and overnight with debridement/application of wound VAC. 3.  Cardiogenic shock with ejection fraction of about 20%: Status post discontinuation of Impella 1/21.  Remains on high-dose midodrine and this is somewhat limited efforts at ultrafiltration as requires fentanyl for sedation which further complicates hypotension. 4.  Acute blood loss anemia: With recurrent bleeding from sternal wound noted, transfusion triggers per TCTS. 5.  Ventilator dependent respiratory failure: Status post tracheostomy on 1/10.  Enteral nutrition via PEG tube.  Subjective:   Status post reexploration of sternal wound yesterday for bleeding.  Underwent debridement and application of wound VAC.   Objective:   BP (!) 84/58   Pulse 92   Temp 98.6 F (37 C) (Axillary)   Resp (!) 22   Ht 6' 3" (1.905 m)   Wt 82.2 kg   SpO2 100%   BMI 22.65 kg/m   Intake/Output Summary (Last 24 hours) at 03/03/2020 0725 Last data filed at 03/03/2020 0700 Gross per 24 hour  Intake 3353.99 ml  Output 2759 ml  Net 594.99 ml   Weight change: 0.9 kg  Physical Exam: Gen: Appears to be comfortable resting in bed on ventilator via tracheostomy CVS: Pulse regular rhythm, normal rate, S1 and S2 normal Resp:  Anteriorly clear to auscultation without distinct rales or rhonchi Abd: Soft, flat, PEG tube in situ, getting tube feeds, bowel sounds normal Ext: Trace ankle edema, right femoral dialysis catheter  Imaging: DG Chest Port 1 View  Result Date: 03/13/2020 CLINICAL DATA:  Abnormal respiration EXAM: PORTABLE CHEST 1 VIEW COMPARISON:  03/25/2020 FINDINGS: Tracheostomy tube tip terminates in the mid trachea, 4.8 cm from the carina. Pacer/defibrillator battery pack overlies left chest wall with lead at the cardiac apex. Surgical changes noted in the right axilla correlate for prior vascular cutdown. Right upper extremity PICC tip terminates in the mid SVC. Persistent thickening along the right lung apex may reflect some loculated fluid. No basilar effusions are seen. No new airspace disease. No visible pneumothorax. Stable cardiomediastinal contours with cardiomegaly. No other acute osseous or soft tissue abnormality. IMPRESSION: 1. Tracheostomy and right PICC in stable satisfactory position. 2. Persistent thickening along the right lung apex may reflect some loculated fluid. 3. Low volumes and atelectasis without new airspace disease. Electronically Signed   By: Lovena Le M.D.   On: 03/26/2020 05:52    Labs: BMET Recent Labs  Lab 02/29/20 0322 02/29/20 1612 03/10/2020 0425 03/09/2020 1419 03/20/2020 0421 03/23/2020 1259 03/22/2020 1601 03/03/20 0408  NA 132* 131* 134* 129* 134* 135 133* 134*  K 4.4 5.1 4.2 4.8 4.2 4.9 4.8 4.5  CL 99 98 99 97* 101  --  103 103  CO2 _0 --  20* 22  GLUCOSE 139* 204* 97 327* 161*  --  221* 139*  BUN 44*  45* 41* 38* 35*  --  41* 44*  CREATININE 0.92 0.86 0.85 1.06 0.88  --  1.20 1.06  CALCIUM 8.1* 8.2* 8.6* 8.2* 8.2*  --  8.2* 8.0*  PHOS 1.5* 3.8 2.0* 3.5 2.1*  --  4.6 3.1   CBC Recent Labs  Lab 02/29/20 0322 03/14/2020 0425 03/25/2020 0421 03/16/2020 0807 03/09/2020 1259 03/03/20 0408  WBC 17.0* 14.8* 15.2*  --   --  17.8*  NEUTROABS 14.3* 12.0* 12.5*   --   --  14.3*  HGB 9.7* 10.0* 9.5* 8.7* 9.5* 7.9*  HCT 33.8* 34.3* 32.7* 30.0* 28.0* 25.6*  MCV 92.3 91.7 91.6  --   --  90.1  PLT 394 390 411*  --   --  412*    Medications:    . sodium chloride   Intravenous Once  . sodium chloride   Intravenous Once  . amiodarone  200 mg Per Tube BID  . vitamin C  500 mg Per Tube TID  . aspirin  324 mg Per Tube Daily  . atorvastatin  80 mg Per Tube Daily  . busPIRone  10 mg Per Tube TID  . chlorhexidine gluconate (MEDLINE KIT)  15 mL Mouth Rinse BID  . Chlorhexidine Gluconate Cloth  6 each Topical Daily  . clonazepam  1 mg Oral QHS  . darbepoetin (ARANESP) injection - NON-DIALYSIS  100 mcg Subcutaneous Q Sat-1800  . docusate  100 mg Per Tube BID  . feeding supplement (PROSource TF)  45 mL Per Tube TID  . fentaNYL (SUBLIMAZE) injection  50 mcg Intravenous Once  . fiber  1 packet Per Tube BID  . FLUoxetine  40 mg Per Tube BID  . HYDROmorphone  4 mg Per Tube Q6H  . insulin aspart  0-9 Units Subcutaneous Q4H  . insulin aspart  4 Units Subcutaneous Q4H  . insulin detemir  12 Units Subcutaneous Q12H  . LORazepam  1 mg Intravenous Q4H  . mouth rinse  15 mL Mouth Rinse 10 times per day  . melatonin  5 mg Per Tube Daily  . mexiletine  150 mg Per Tube Q8H  . midodrine  20 mg Per Tube TID WC  . multivitamin  1 tablet Per Tube QHS  . nutrition supplement (JUVEN)  1 packet Per Tube BID BM  . pantoprazole (PROTONIX) IV  40 mg Intravenous Q12H  . polyethylene glycol  17 g Per Tube Daily  . sodium chloride flush  10-40 mL Intracatheter Q12H  . thiamine  100 mg Per Tube Daily  . valproic acid  250 mg Per Tube BID    , MD 03/03/2020, 7:25 AM   

## 2020-03-03 NOTE — Progress Notes (Signed)
Occupational Therapy Treatment Patient Details Name: Corey Palmer MRN: [id number] DOB: 07-Feb-1970 Today's Date: 03/03/2020    History of present illness 50 yo male admitted 12/1 with A. fib/RVR, torsades, cardiogenic shock (EF 15) requiring Impella placement (removed 1/21), CABG x 4 c/b cardiac tamponade requiring chest reopening (now with VAC), AKI requiring initiation of CRRT. PMH including anxiety, arthritis, CAD, CHF, DM type ll, HTN, obesity, pacemaker, paroxysmal a-fib, and sleep apnea.   OT comments  Patient continues to be medically compromised.  Patient repositioned with nursing and pillow used to help minimize L trunk lean in supine.  L restraint replaced and heel protectors replaced.  Patient moved through PROM as appropriate given CRRT and radial A line to R wrist.  Patient was able to perform AAROM to L arm with fist pumps, wrist flex, forearm supination, elbow flexion and minimal shoulder flexion.  OT to review goals and down grade as appropriate.  Continue to follow in the acute setting as appropriate.     Follow Up Recommendations  LTACH;Supervision/Assistance - 24 hour    Equipment Recommendations  Other (comment);Wheelchair (measurements OT);Wheelchair cushion (measurements OT);3 in 1 bedside commode;Hospital bed    Recommendations for Other Services      Precautions / Restrictions Precautions Precautions: Sternal Precaution Comments: CRRT via R groin line, Vent on 5 of peep, wound vac on chest, continuous O2 at 30% Restrictions Weight Bearing Restrictions: Yes Other Position/Activity Restrictions: sternal precautions       Mobility Bed Mobility   Bed Mobility: Rolling Rolling: +2 for physical assistance;Total assist         General bed mobility comments: repositioned in bed, and pillows placed to minimize L lean  Transfers                         Vision       Perception     Praxis      Cognition Arousal/Alertness: Suspect due to  medications;Lethargic                                              Exercises General Exercises - Upper Extremity Shoulder Flexion: PROM;Both;10 reps;Supine (limited to 60 degrees) Elbow Flexion: PROM;Both;10 reps;Seated Elbow Extension: PROM;Both;10 reps;Supine Wrist Flexion: PROM;10 reps;Supine;Left Wrist Extension: PROM;10 reps;Supine;Left Digit Composite Flexion: PROM;Left;10 reps;Supine Composite Extension: PROM;Left;10 reps;Supine General Exercises - Lower Extremity Ankle Circles/Pumps: PROM;Both;10 reps;Supine Heel Slides: PROM;Left;10 reps;Supine Hip ABduction/ADduction: PROM;Both;5 reps;Supine   Shoulder Instructions       General Comments      Pertinent Vitals/ Pain       Pain Assessment: Faces Faces Pain Scale: Hurts a little bit Pain Location: left knee flexion Pain Descriptors / Indicators: Grimacing;Guarding Pain Intervention(s): Monitored during session         Frequency  Min 2X/week        Progress Toward Goals  OT Goals(current goals can now be found in the care plan section)     Acute Rehab OT Goals Patient Stated Goal: unstated OT Goal Formulation: Patient unable to participate in goal setting Time For Goal Achievement: 03/07/20 Potential to Achieve Goals: [place] Discharge plan remains appropriate    Co-evaluation                 AM-PAC OT "6 Clicks" Daily Activity     Outcome Measure   Help  from another person eating meals?: Total Help from another person taking care of personal grooming?: Total Help from another person toileting, which includes using toliet, bedpan, or urinal?: Total Help from another person bathing (including washing, rinsing, drying)?: Total Help from another person to put on and taking off regular upper body clothing?: Total Help from another person to put on and taking off regular lower body clothing?: Total 6 Click Score: 6    End of Session Equipment Utilized During Treatment:  Oxygen  OT Visit Diagnosis: Other abnormalities of gait and mobility (R26.89);Muscle weakness (generalized) (M62.81);Pain   Activity Tolerance Patient limited by lethargy   Patient Left in bed;with family/visitor present;with call bell/phone within reach   Nurse Communication          Time: 5909-3112 OT Time Calculation (min): 16 min  Charges: OT General Charges $OT Visit: 1 Visit OT Treatments $Therapeutic Exercise: 8-22 mins  03/03/2020  Rich, OTR/L  Acute Rehabilitation Services  Office:  [place] 03/03/2020, 9:42 AM

## 2020-03-03 NOTE — Progress Notes (Signed)
Patient ID: Corey Palmer, male   DOB: 1970/03/30, 50 y.o.   MRN: [id number]  I met with wife today and discussed current situation, I also talked with Dr. Posey Pronto with nephrology.  He is now off norepinephrine.  Chest wall is not healing.  He remains on CVVH.   We are going to make him DNR.   He will need to make transition to [facility] for any forward progress. We will attempt this over the weekend.  If he cannot manage his volume by iHD, will need to move down the comfort care route.    His wife understands and agrees with the above.   Corey Palmer 03/03/2020 1:34 PM

## 2020-03-03 NOTE — Progress Notes (Signed)
Palliative Medicine Inpatient Follow Up Note  HPI: 50 year old man admitted 12/1 with A. fib/RVR, torsades, cardiogenic shock [EF 15] requiring Impella placement (removed 1/21), CABG x 4 c/b cardiac tamponade requiring chest reopening (now with VAC), AKI requiring on CRRT.  Palliative care is involved to discuss goals of care in the setting of multisystem organ failure.  Today's Discussion (03/03/2020):  *Please note that this is a verbal dictation therefore any spelling or grammatical errors are due to the "[place] [facility]" system interpretation.  Chart reviewed.   I met at bedside this afternoon with Dr. Aundra Dubin, Andreas Blower, RN, and patients wife Corey Palmer.  Dr. Aundra Dubin explained that given the organ failure Corey Palmer has endured there is great concern about his ability to heal from the injuries he has endured to his chest and body as a whole. He explained that while the pressors have been decreased there is still the issue of his chest wound which will likely not improve per conversations with Plastic surgery. Also discussed that patients kidney injury is significant and patient will likely not be able to tolerate intermittent HD. He shares that this will need to be discussed with nephrology but if Corey Palmer is unable to tolerate intermittent HD then we need to transition our focus to comfort measures. Patients wife, Corey Palmer was in agreement.  Dr. Aundra Dubin discussed transitioning Corey Palmer to DNR status as at this point additional measures for cardiac resuscitation would be to no avail. Patients wife agreed.  After Dr. Aundra Dubin left I spoke with Corey Palmer. We discussed that Corey Palmer has been in the hospital for sixty four days and his body has fought throughout this time. We talked about the reality that our bodies and modern medicine can only tolerate and do so much. I shared that this is often a very difficult reality to come to grips with. I noted how Corey Palmer had been optimistic throughout. We reviewed the  sequence of events that have led Corey Palmer here. We discussed that Corey Palmer would not find his current state to be the quality of the life he would wish to live.  We discussed that if unable to tolerate intermittent HD what comfort measures would look like. We talked about transition to comfort measures in house and what that would entail inclusive of medications to control pain, dyspnea, agitation, nausea, itching, and hiccups. We discussed stopping all uneccessary measures such as CRRT, abx, blood draws, needle sticks, and frequent vital signs.   Given how upsetting this was, I utilized reflective listening throughout our time together.   I shared with Corey Palmer that it would be of value to have her children make plans to visit. She has a son who lives outside of [place] who will need to coordinate a flight here.   Corey Palmer, Palliative Chaplain presence was requested during this very difficult time.   Discussed the importance of continued conversation with family and their  medical providers regarding overall plan of care and treatment options, ensuring decisions are within the context of the patients values and GOCs.  Questions and concerns addressed   Objective Assessment: Vital Signs Vitals:   03/03/20 1400 03/03/20 1415  BP: (!) 82/51   Pulse: 88 85  Resp: (!) 22 (!) 26  Temp:    SpO2: 100% 100%    Intake/Output Summary (Last 24 hours) at 03/03/2020 1434 Last data filed at 03/03/2020 1400 Gross per 24 hour  Intake 3467.43 ml  Output 3246 ml  Net 221.43 ml   Last Weight  Most recent update:  03/03/2020  4:18 AM   Weight  82.2 kg (181 lb 3.5 oz)           Gen:  Caucasian M agitated moving arms HEENT: trach collar, dry mucous membranes CV: Regular rate and rhythm  Lungs: Trach ABD: soft/nontender EXT: No edema  Neuro: Disoriented  SUMMARY OF RECOMMENDATIONS   DNAR code status  Trial of iHD - if unable to tolerate will need to be transitioned to comfort measures in  house  Appreciate spiritual support  Ongoing PMT discussions  Time Spent: 55 Greater than 50% of the time was spent in counseling and coordination of care ______________________________________________________________________________________ [facility] Team Team Cell Phone: [phone] Please utilize secure chat with additional questions, if there is no response within 30 minutes please call the above phone number  Palliative Medicine Team providers are available by phone from 7am to 7pm daily and can be reached through the team cell phone.  Should this patient require assistance outside of these hours, please call the patient's attending physician.

## 2020-03-04 ENCOUNTER — Inpatient Hospital Stay (HOSPITAL_COMMUNITY): Payer: BC Managed Care – PPO

## 2020-03-04 DIAGNOSIS — R570 Cardiogenic shock: Secondary | ICD-10-CM | POA: Diagnosis not present

## 2020-03-04 DIAGNOSIS — J9601 Acute respiratory failure with hypoxia: Secondary | ICD-10-CM | POA: Diagnosis not present

## 2020-03-04 LAB — CBC
HCT: 27.1 % — ABNORMAL LOW (ref 39.0–52.0)
Hemoglobin: 8.2 g/dL — ABNORMAL LOW (ref 13.0–17.0)
MCH: 27.4 pg (ref 26.0–34.0)
MCHC: 30.3 g/dL (ref 30.0–36.0)
MCV: 90.6 fL (ref 80.0–100.0)
Platelets: 218 10*3/uL (ref 150–400)
RBC: 2.99 MIL/uL — ABNORMAL LOW (ref 4.22–5.81)
RDW: 19.4 % — ABNORMAL HIGH (ref 11.5–15.5)
WBC: 13.5 10*3/uL — ABNORMAL HIGH (ref 4.0–10.5)
nRBC: 0 % (ref 0.0–0.2)

## 2020-03-04 LAB — CBC WITH DIFFERENTIAL/PLATELET
Abs Immature Granulocytes: 0.1 10*3/uL — ABNORMAL HIGH (ref 0.00–0.07)
Basophils Absolute: 0 10*3/uL (ref 0.0–0.1)
Basophils Relative: 0 %
Eosinophils Absolute: 0.3 10*3/uL (ref 0.0–0.5)
Eosinophils Relative: 2 %
HCT: 24.7 % — ABNORMAL LOW (ref 39.0–52.0)
Hemoglobin: 7.3 g/dL — ABNORMAL LOW (ref 13.0–17.0)
Immature Granulocytes: 1 %
Lymphocytes Relative: 7 %
Lymphs Abs: 1 10*3/uL (ref 0.7–4.0)
MCH: 26.6 pg (ref 26.0–34.0)
MCHC: 29.6 g/dL — ABNORMAL LOW (ref 30.0–36.0)
MCV: 90.1 fL (ref 80.0–100.0)
Monocytes Absolute: 1.1 10*3/uL — ABNORMAL HIGH (ref 0.1–1.0)
Monocytes Relative: 8 %
Neutro Abs: 10.7 10*3/uL — ABNORMAL HIGH (ref 1.7–7.7)
Neutrophils Relative %: 82 %
Platelets: 234 10*3/uL (ref 150–400)
RBC: 2.74 MIL/uL — ABNORMAL LOW (ref 4.22–5.81)
RDW: 20.6 % — ABNORMAL HIGH (ref 11.5–15.5)
WBC: 13.2 10*3/uL — ABNORMAL HIGH (ref 4.0–10.5)
nRBC: 0 % (ref 0.0–0.2)

## 2020-03-04 LAB — RENAL FUNCTION PANEL
Albumin: 2.3 g/dL — ABNORMAL LOW (ref 3.5–5.0)
Albumin: 2.1 g/dL — ABNORMAL LOW (ref 3.5–5.0)
Anion gap: 9 (ref 5–15)
Anion gap: 7 (ref 5–15)
BUN: 46 mg/dL — ABNORMAL HIGH (ref 6–20)
BUN: 47 mg/dL — ABNORMAL HIGH (ref 6–20)
CO2: 23 mmol/L (ref 22–32)
CO2: 24 mmol/L (ref 22–32)
Calcium: 8.2 mg/dL — ABNORMAL LOW (ref 8.9–10.3)
Calcium: 8 mg/dL — ABNORMAL LOW (ref 8.9–10.3)
Chloride: 103 mmol/L (ref 98–111)
Chloride: 102 mmol/L (ref 98–111)
Creatinine, Ser: 1.06 mg/dL (ref 0.61–1.24)
Creatinine, Ser: 1.01 mg/dL (ref 0.61–1.24)
GFR, Estimated: >60 mL/min (ref >60)
GFR, Estimated: >60 mL/min (ref >60)
Glucose, Bld: 175 mg/dL — ABNORMAL HIGH (ref 70–99)
Glucose, Bld: 182 mg/dL — ABNORMAL HIGH (ref 70–99)
Phosphorus: 2.1 mg/dL — ABNORMAL LOW (ref 2.5–4.6)
Phosphorus: 2 mg/dL — ABNORMAL LOW (ref 2.5–4.6)
Potassium: 4.5 mmol/L (ref 3.5–5.1)
Potassium: 4.1 mmol/L (ref 3.5–5.1)
Sodium: 135 mmol/L (ref 135–145)
Sodium: 133 mmol/L — ABNORMAL LOW (ref 135–145)

## 2020-03-04 LAB — MAGNESIUM: Magnesium: 2.4 mg/dL (ref 1.7–2.4)

## 2020-03-04 LAB — COOXEMETRY PANEL
Carboxyhemoglobin: 2.2 % — ABNORMAL HIGH (ref 0.5–1.5)
Methemoglobin: 0.6 % (ref 0.0–1.5)
O2 Saturation: 67.8 %
Total hemoglobin: 7.6 g/dL — ABNORMAL LOW (ref 12.0–16.0)

## 2020-03-04 LAB — BASIC METABOLIC PANEL
Anion gap: 9 (ref 5–15)
BUN: 45 mg/dL — ABNORMAL HIGH (ref 6–20)
CO2: 23 mmol/L (ref 22–32)
Calcium: 8.2 mg/dL — ABNORMAL LOW (ref 8.9–10.3)
Chloride: 104 mmol/L (ref 98–111)
Creatinine, Ser: 1.07 mg/dL (ref 0.61–1.24)
GFR, Estimated: >60 mL/min (ref >60)
Glucose, Bld: 176 mg/dL — ABNORMAL HIGH (ref 70–99)
Potassium: 4.5 mmol/L (ref 3.5–5.1)
Sodium: 136 mmol/L (ref 135–145)

## 2020-03-04 LAB — GLUCOSE, CAPILLARY
Glucose-Capillary: 116 mg/dL — ABNORMAL HIGH (ref 70–99)
Glucose-Capillary: 153 mg/dL — ABNORMAL HIGH (ref 70–99)
Glucose-Capillary: 153 mg/dL — ABNORMAL HIGH (ref 70–99)
Glucose-Capillary: 164 mg/dL — ABNORMAL HIGH (ref 70–99)
Glucose-Capillary: 169 mg/dL — ABNORMAL HIGH (ref 70–99)
Glucose-Capillary: 177 mg/dL — ABNORMAL HIGH (ref 70–99)
Glucose-Capillary: 177 mg/dL — ABNORMAL HIGH (ref 70–99)

## 2020-03-04 LAB — PHOSPHORUS: Phosphorus: 2.1 mg/dL — ABNORMAL LOW (ref 2.5–4.6)

## 2020-03-04 LAB — PREPARE RBC (CROSSMATCH)

## 2020-03-04 LAB — HEPARIN LEVEL (UNFRACTIONATED): Heparin Unfractionated: 0.25 IU/mL — ABNORMAL LOW (ref 0.30–0.70)

## 2020-03-04 MED ORDER — SODIUM CHLORIDE 0.9% IV SOLUTION: Freq: Once | INTRAVENOUS | Status: AC

## 2020-03-04 MED ORDER — FENTANYL 2500MCG IN NS 250ML (10MCG/ML) PREMIX INFUSION
0.0000 ug/h | INTRAVENOUS | Status: DC
  Administered 2020-03-04: 25 ug/h via INTRAVENOUS
  Filled 2020-03-04: qty 250

## 2020-03-04 MED ORDER — LORAZEPAM 2 MG/ML IJ SOLN: 1.0000 mg | INTRAMUSCULAR | Status: DC | PRN

## 2020-03-04 MED ORDER — HEPARIN (PORCINE) 25000 UT/250ML-% IV SOLN
1600.0000 [IU]/h | INTRAVENOUS | Status: DC
  Administered 2020-03-04: 1800 [IU]/h via INTRAVENOUS
  Administered 2020-03-04: 1750 [IU]/h via INTRAVENOUS
  Administered 2020-03-05 – 2020-03-06 (×2): 1800 [IU]/h via INTRAVENOUS
  Filled 2020-03-04 (×5): qty 250

## 2020-03-04 NOTE — Progress Notes (Signed)
160cc Fentanyl wasted in stericycle from old infusion bag, with second RN, Jenifer Mlekush.

## 2020-03-04 NOTE — Progress Notes (Signed)
[facility] for Heparin Indication: Afib  Allergies  Allergen Reactions  . Orange Fruit Anaphylaxis, Hives and Other (See Comments)    Per Pt- Blisters around lips and Hives all over, also  . Penicillins Anaphylaxis    Did it involve swelling of the face/tongue/throat, SOB, or low BP? Yes Did it involve sudden or severe rash/hives, skin peeling, or any reaction on the inside of your mouth or nose? Yes Did you need to seek medical attention at a hospital or doctor's office? No When did it last happen?childhood If all above answers are "NO", may proceed with cephalosporin use.  [name] [Empagliflozin] Itching  . [name] [Insulin Glargine] Nausea And Vomiting    Patient Measurements: Height: 6\' 3"  (190.5 cm) Weight: 81.1 kg (178 lb 12.7 oz) IBW/kg (Calculated) : 84.5 Heparin dosing wt: 101 kg  Vital Signs: Temp: 97.7 F (36.5 C) (02/05 1600) Temp Source: Axillary (02/05 1600) BP: 106/67 (02/05 1800) Pulse Rate: 82 (02/05 1800)  Labs: Recent Labs    03/23/2020 0421 03/26/2020 0807 03/03/20 0408 03/03/20 1345 03/03/20 1529 03/04/20 0412 03/04/20 1354 03/04/20 1542 03/04/20 1739  HGB 9.5*   < > 7.9* 8.2*  --  7.3* 8.2*  --   --   HCT 32.7*   < > 25.6* 26.4*  --  24.7* 27.1*  --   --   PLT 411*  --  412*  --   --  234 218  --   --   HEPARINUNFRC 0.35  --   --   --   --   --   --   --  0.25*  CREATININE 0.88   < > 1.06  --  1.03 1.07  1.06  --  1.01  --    < > = values in this interval not displayed.    Estimated Creatinine Clearance: 99.3 mL/min (by C-G formula based on SCr of 1.01 mg/dL).   Assessment: 4 yoM with hx PAF on apixaban PTA. Pt admitted with shock and required Impella 5.5 support. CABG 12/15 with chest left open, c/b hematoma and significant bleeding. Ultimately heparin started via purge for Impella. DCCV performed 12/16, partial chest closure 1/5, Impella removed 1/21. Heparin resumed for AFib 1/22 and  stopped 2/3 due to bleeding from wound vac. Low dose heparin to restart today. -Heparin level was ~ 0.3 on 1750 units/hr previously, on check this evening just below goal at 0.25. Hgb up to 8.2, plt count is down significantly from 400s to 218.   Goal of Therapy:  Heparin level ~0.3  Monitor platelets by anticoagulation protocol: Yes   Plan:  -Increase heparin to 1800 units/hr -Heparin level daily wth CBC daily  Erin Hearing PharmD., BCPS Clinical Pharmacist 03/04/2020 6:47 PM

## 2020-03-04 NOTE — Progress Notes (Signed)
[facility] for Heparin Indication: Afib  Allergies  Allergen Reactions  . Orange Fruit Anaphylaxis, Hives and Other (See Comments)    Per Pt- Blisters around lips and Hives all over, also  . Penicillins Anaphylaxis    Did it involve swelling of the face/tongue/throat, SOB, or low BP? Yes Did it involve sudden or severe rash/hives, skin peeling, or any reaction on the inside of your mouth or nose? Yes Did you need to seek medical attention at a hospital or doctor's office? No When did it last happen?childhood If all above answers are "NO", may proceed with cephalosporin use.  Corey Palmer [Empagliflozin] Itching  . Basaglar Kwikpen [Insulin Glargine] Nausea And Vomiting    Patient Measurements: Height: 6\' 3"  (190.5 cm) Weight: 81.1 kg (178 lb 12.7 oz) IBW/kg (Calculated) : 84.5 Heparin dosing wt: 101 kg  Vital Signs: Temp: 98.1 F (36.7 C) (02/05 0754) Temp Source: Axillary (02/05 0754) BP: 86/53 (02/05 0830) Pulse Rate: 78 (02/05 0830)  Labs: Recent Labs    03/04/2020 0421 03/01/2020 0807 03/03/20 0408 03/03/20 1345 03/03/20 1529 03/04/20 0412  HGB 9.5*   < > 7.9* 8.2*  --  7.3*  HCT 32.7*   < > 25.6* 26.4*  --  24.7*  PLT 411*  --  412*  --   --  234  HEPARINUNFRC 0.35  --   --   --   --   --   CREATININE 0.88   < > 1.06  --  1.03 1.07  1.06   < > = values in this interval not displayed.    Estimated Creatinine Clearance: 94.6 mL/min (by C-G formula based on SCr of 1.06 mg/dL).   Assessment: 17 yoM with hx PAF on apixaban PTA. Pt admitted with shock and required Impella 5.5 support. CABG 12/15 with chest left open, c/b hematoma and significant bleeding. Ultimately heparin started via purge for Impella. DCCV performed 12/16, partial chest closure 1/5, Impella removed 1/21. Heparin resumed for AFib 1/22 and stopped 2/3 due to bleeding from wound vac. Low dose heparin to restart today. -Heparin level was ~ 0.3 on 1750 units/hr  previously -hg= 7.3  Goal of Therapy:  Heparin level ~0.3  Monitor platelets by anticoagulation protocol: Yes   Plan:  -Restart heparin at 1750 units/hr -Heparin level in 8 hours and daily wth CBC daily  Hildred Laser, PharmD Clinical Pharmacist **Pharmacist phone directory can now be found on [email or web address] ([email or web address]).  Listed under [facility].

## 2020-03-04 NOTE — Progress Notes (Signed)
Patient ID: Corey Palmer, male   DOB: [birth year], 50 y.o.   MRN: [id number]     Advanced Heart Failure Rounding Note  PCP-Cardiologist: No primary care provider on file.   Subjective:    - 12/7 Torsades -ICD shock x1.  - 12/11 Impella 5.5 placed.  - 12/12 Impella repositioned in OR - 12/15 CABG x 4 with LIMA-LAD, SVG-PDA/PLV, SVG-OM - 12/16 DCCV afib.  Back to OR to re-open chest and again to evacuate hematoma.  - 12/19 CVVH initiated  - 12/23 Chest closed - 12/24 Chest reopened - 12/26 RAP back into NSR - 12/27 back in atrial fibrillation, unable to rapid atrial pace out.   - 12/28 To OR, chest partially closed and wound vac placed.  DCCV to NSR.  - 12/31 Antibiotics stopped. Swan removed, HD catheter moved from right femoral to right IJ.  - 1/5 To OR for chest closure. Chest reopened emergently at bedside again to evacuate hematoma  - 1/10 To OR: omental flap closure of chest with wound vac, tracheostomy, G-tube, J-tube.    - 1/12 Plastics and EP consulted.  - 1/13 Palliative Care consulted.  - 1/15 Went into VT, Shock 200 J and amiodarone bolus 150.   - 1/16: Milrinone stopped.    - 1/17: Partial closure of chest in OR - 1/18 Switched to dobutamine 4 mcg - 1/21 Impella Extracted.  Developed hyperkalemia when CVVH held with wide QRS rhythm and required transient external pacing.   - 1/27 OR for wound vac change, partial chest wall closure.  - 2/2 S/P sternal debridement , closure part of sternal wound, A cell  - 2/3 Return to OR for re-exploration of sternal wound due to bleeding.   Remains on dobutamine 3 mcg/kg/min and NE 3 started overnight.  CVVH running even. CVP 6.  Co-ox 68%.    No further bleeding at wound vac.  He remains off heparin gtt for now. Hgb 7.3 today.   Made DNR yesterday but then rescinded as wife wants family to have time to get in to see him from the [facility].    Objective:   Weight Range: 81.1 kg Body mass index is 22.35 kg/m.   Vital  Signs:   Temp:  [97.3 F (36.3 C)-98.3 F (36.8 C)] 98.1 F (36.7 C) (02/05 0754) Pulse Rate:  [69-98] 81 (02/05 0742) Resp:  [10-34] 24 (02/05 0742) BP: (81-119)/(47-69) 119/47 (02/05 0742) SpO2:  [100 %] 100 % (02/05 0742) Arterial Line BP: (85-134)/(39-61) 114/45 (02/05 0700) FiO2 (%):  [30 %] 30 % (02/05 0742) Weight:  [81.1 kg] 81.1 kg (02/05 0358) Last BM Date: 03/03/20  Weight change: Filed Weights   02/29/2020 0422 03/03/20 0418 03/04/20 0358  Weight: 81.3 kg 82.2 kg 81.1 kg    Intake/Output:   Intake/Output Summary (Last 24 hours) at 03/04/2020 0821 Last data filed at 03/04/2020 0700 Gross per 24 hour  Intake 2878.58 ml  Output 1958 ml  Net 920.58 ml      Physical Exam   General: NAD, sedated Neck: No JVD, no thyromegaly or thyroid nodule.  Lungs: Clear to auscultation bilaterally with normal respiratory effort. CV: Chest wall open.  Heart regular S1/S2, no S3/S4, no murmur.  No peripheral edema.   Abdomen: Soft, nontender, no hepatosplenomegaly, no distention.  Skin: Intact without lesions or rashes.  Neurologic: Will awaken and follow commands.  Extremities: No clubbing or cyanosis.  HEENT: Normal.    Telemetry   SR 80s (personally reviewed)  Labs  CBC Recent Labs    03/03/20 0408 03/03/20 1345 03/04/20 0412  WBC 17.8*  --  13.2*  NEUTROABS 14.3*  --  10.7*  HGB 7.9* 8.2* 7.3*  HCT 25.6* 26.4* 24.7*  MCV 90.1  --  90.1  PLT 412*  --  449   Basic Metabolic Panel Recent Labs    03/03/20 0408 03/03/20 1529 03/04/20 0412  NA 134* 135 136  135  K 4.5 4.3 4.5  4.5  CL 103 102 104  103  CO2 '22 23 23  23  ' GLUCOSE 139* 170* 176*  175*  BUN 44* 50* 45*  46*  CREATININE 1.06 1.03 1.07  1.06  CALCIUM 8.0* 7.8* 8.2*  8.2*  MG 2.5*  --  2.4  PHOS 3.1 2.5 2.1*  2.1*   Liver Function Tests Recent Labs    03/03/20 1529 03/04/20 0412  ALBUMIN 1.9* 2.3*   No results for input(s): LIPASE, AMYLASE in the last 72 hours. Cardiac  Enzymes No results for input(s): CKTOTAL, CKMB, CKMBINDEX, TROPONINI in the last 72 hours.  BNP: BNP (last 3 results) Recent Labs    01/07/2020 1619  BNP 577.5*    ProBNP (last 3 results) No results for input(s): PROBNP in the last 8760 hours.   D-Dimer No results for input(s): DDIMER in the last 72 hours. Hemoglobin A1C No results for input(s): HGBA1C in the last 72 hours. Fasting Lipid Panel No results for input(s): CHOL, HDL, LDLCALC, TRIG, CHOLHDL, LDLDIRECT in the last 72 hours. Thyroid Function Tests No results for input(s): TSH, T4TOTAL, T3FREE, THYROIDAB in the last 72 hours.  Invalid input(s): FREET3  Other results:   Imaging    DG Chest Port 1 View  Result Date: 03/04/2020 CLINICAL DATA:  Abnormal respirations. Status post vacuo a shin of sternal wound hematoma yesterday. EXAM: PORTABLE CHEST 1 VIEW COMPARISON:  One-view chest x-ray 03/03/2020. FINDINGS: Tracheostomy tube is stable. Heart is enlarged. Right apical pleural effusion is stable. Mild bibasilar atelectasis is unchanged. IMPRESSION: 1. Stable right apical pleural effusion. 2. Stable mild bibasilar atelectasis. 3. Stable tracheostomy tube. Electronically Signed   By: San Morelle M.D.   On: 03/04/2020 07:10     Medications:     Scheduled Medications: . sodium chloride   Intravenous Once  . sodium chloride   Intravenous Once  . amiodarone  200 mg Per Tube BID  . vitamin C  500 mg Per Tube TID  . aspirin  324 mg Per Tube Daily  . atorvastatin  80 mg Per Tube Daily  . busPIRone  10 mg Per Tube TID  . chlorhexidine gluconate (MEDLINE KIT)  15 mL Mouth Rinse BID  . Chlorhexidine Gluconate Cloth  6 each Topical Daily  . clonazepam  1 mg Oral TID  . darbepoetin (ARANESP) injection - NON-DIALYSIS  100 mcg Subcutaneous Q Sat-1800  . docusate  100 mg Per Tube BID  . feeding supplement (PROSource TF)  45 mL Per Tube TID  . fentaNYL (SUBLIMAZE) injection  50 mcg Intravenous Once  . fiber  1 packet  Per Tube BID  . FLUoxetine  40 mg Per Tube BID  . HYDROmorphone  4 mg Per Tube Q6H  . insulin aspart  0-9 Units Subcutaneous Q4H  . insulin aspart  4 Units Subcutaneous Q4H  . insulin detemir  12 Units Subcutaneous Q12H  . LORazepam  0.5 mg Intravenous Q4H  . mouth rinse  15 mL Mouth Rinse 10 times per day  . melatonin  5 mg  Per Tube Daily  . mexiletine  150 mg Per Tube Q8H  . midodrine  20 mg Per Tube TID WC  . multivitamin  1 tablet Per Tube QHS  . nutrition supplement (JUVEN)  1 packet Per Tube BID BM  . pantoprazole (PROTONIX) IV  40 mg Intravenous Q12H  . polyethylene glycol  17 g Per Tube Daily  . sodium chloride flush  10-40 mL Intracatheter Q12H  . thiamine  100 mg Per Tube Daily  . valproic acid  250 mg Per Tube BID    Infusions: .  prismasol BGK 4/2.5 500 mL/hr at 03/04/20 9735  .  prismasol BGK 4/2.5 200 mL/hr at 03/04/20 3299  . sodium chloride Stopped (03/05/2020 0753)  . sodium chloride    . dexmedetomidine (PRECEDEX) IV infusion 0.5 mcg/kg/hr (03/04/20 0700)  . dextrose Stopped (03/25/2020 1520)  . DOBUTamine 3 mcg/kg/min (03/04/20 0700)  . feeding supplement (PIVOT 1.5 CAL) 75 mL/hr at 03/04/20 0400  . lactated ringers Stopped (02/29/20 0835)  . norepinephrine (LEVOPHED) Adult infusion 3 mcg/min (03/04/20 0700)  . prismasol BGK 4/2.5 1,500 mL/hr at 03/04/20 0812    PRN Medications: sodium chloride, Place/Maintain arterial line **AND** sodium chloride, artificial tears, bisacodyl **OR** bisacodyl, dextrose, docusate, fentaNYL, heparin, heparin, lip balm, loperamide HCl, metoprolol tartrate, midazolam, nystatin, polyethylene glycol, sodium chloride flush     Assessment/Plan   1. Cardiogenic shock/acute on chronic systolic CHF: Initially nonischemic cardiomyopathy.  Possible familial cardiomyopathy as both parents had cardiomyopathy and died at around 29. However, [employer] gene testing did not show any common mutation for cardiomyopathy.  However, this admission  noted to have severe 3 vessel disease so suspect component of ischemic cardiomyopathy.  St Jude ICD.  Echo in 8/20 with EF 15% and mildly decreased RV function. Echo this admission with EF < 20%, moderate LV dilation, RV mildly reduced, severe LAE, no significant MR. Low output HF with markedly low EF.  He has a long history of cardiomyopathy (20 yrs), tends to minimize symptoms.  Cardiorenal syndrome with creatinine up to 2.2,  stabilized on milrinone and Impella 5.5. SCr 1.44 day of CABG. CABG 12/15.  Post-op shock, back to OR 12/16 to open chest (chest wall compartment syndrome) and again to evacuate hematoma.  TEE post-op with severe RV dysfunction.  CVVHD initiated 12/19 for volume removal in setting of rising Scr/decreased UOP. Suspect ATN. S/p chest closure 12/23. Hemodynamics much worse on 12/24 with rising pressor demands. Co-ox 36%.  Bedside echo severe biventricular dysfunction with marked septal bounce. Chest re-opened 12/24.  Was atrial paced back to NSR on 12/26 with improvement in hemodynamics but back in atrial fibrillation on 12/27 and unable to pace out, DCCV to NSR on 12/28. Went back to OR 1/5 for chest closure but several hours later required emergent re-opening of chest at bedside to evacuate hematoma. To OR again 1/10 with closure of chest with omental flap with G & J tube.  1/17 partial closure. 1/21 Impella removed.  Off CVVH post-removal, he developed hyperkalemia and wide QRS rhythm requiring transient external pacing, also with suspected septic shock (low SVR, fever, rising WBCs).  Back to OR 2/3 for bleeding at chest wall wound vac.  This morning, he is on dobutamine 3 and NE 3.  Co-ox 68% with CVVH running even.  - Can wean NE for SBP > 95 (should be able to stop today). Continue midodrine.   - Increase dobutamine back to 4. - Run CVVH even today with CVP 6.  -  Not candidate to replace Impella (at this point, not candidate for LVAD or transplant).  2. Atrial fibrillation: H/o PAF.   He was on dofetilide in the remote past but this was stopped due to noncompliance.  He had an upper GI bleed from antral ulcers in 3/12. He was seen by GI and was deemed safe to restart anticoagulation as long as he remains on a PPI.  No apparent recurrence of AF until just prior to this admission, was cardioverted back to NSR in ER but went back into atrial fibrillation.  Post-op afib, DCCV to NSR/BiV pacing am 12/16 -> back to AF. Rapid atrial pacing back to SR. Remains in NSR - Continue amiodarone per tube 200 mg bid.  - Restart heparin gtt at low dose today.   3. CAD:   Cath this admission with severe 3 vessel disease. CABG x 4 on 12/15.  - Continue atorvastatin, ASA.  4. Acute on chronic hypoxemic respiratory failure: Vent per CCM.  Has tracheostomy. Has been getting PSV trials. - Trial trach collar eventually per CCM.  5. AKI on CKD stage 3: Likely ATN/cardiorenal. Anuric. On CVVH.    - Have discussed plan with renal, CVVH through tomorrow pm and will stop.  Attempt iHD Monday.    6. Diabetes: Insulin.  7. Hyponatremia: Resolved with CVVH.  8. Torsades/ICD shock: 12/7/212 in hospital event, in setting of severe hypokalemia and hypomagnesemia.   9. Gout: h/o gout. Complained of rt knee pain c/w previous flares - treated w/ prednisone burst (completed).  10: ID: Septic shock 1/21 with fever, low SVR, and elevated WBCs. Cultures no growth with exception of axillary cx on 1/21 grew staph epidermidis.  IJ catheter removed. Completed hydrocortisone.  - Off abx. 11. FEN: c/w TFs.  12. Anemia: Hgb 7.3 today.  Transfuse today.   13. Stage II Pressure Ulcer- sacrum: Continue to reposition every 2 hours R/L  14. Pleural Effusion: Loculated rt apical pleural effusion noted on imaging. Continue UF through CVVH.  PCCM following  15. VT: Amiodarone + mexiletine.   16. Deconditioning: Profound. Work with PT, nursing to work on range of motion.   Poor prognosis at this point, chest wall is not healing  and I do not think he will tolerate iHD.  Plan for now will be to stop CVVH tomorrow pm and try iHD on Monday.  He will remain full code until his son from the [place] has had time to see him (arrives early next week), then transition to DNR.  Will need comfort care if does not tolerate iHD.    CRITICAL CARE Performed by: Loralie Champagne  Total critical care time: 40 minutes  Critical care time was exclusive of separately billable procedures and treating other patients.  Critical care was necessary to treat or prevent imminent or life-threatening deterioration.  Critical care was time spent personally by me on the following activities: development of treatment plan with patient and/or surrogate as well as nursing, discussions with consultants, evaluation of patient's response to treatment, examination of patient, obtaining history from patient or surrogate, ordering and performing treatments and interventions, ordering and review of laboratory studies, ordering and review of radiographic studies, pulse oximetry and re-evaluation of patient's condition.  Loralie Champagne 03/04/2020 8:21 AM

## 2020-03-04 NOTE — Progress Notes (Signed)
Patient ID: Corey Palmer, male   DOB: 25-Sep-1970, 50 y.o.   MRN: [id number] [facility] Progress Note   Assessment/ Plan:   1. Acute kidney Injury on chronic kidney disease stage III: By definition-now end-stage renal disease given prolonged dialysis dependency (on CRRT since 01/16/2020).  Etiology suspected to be ischemic ATN in the setting of cardiogenic shock status post CABG. Anuric and on CRRT for renal replacement therapy-volume status appears to be actually hypovolemic and I have asked for RN to run him 50 cc/hour positive on CRRT for the next 24 hours and then keep him even.  I plan to stop CRRT tomorrow (2/6) evening and undertake an attempted intermittent hemodialysis on 2/7. 2.  Coronary artery disease status post CABG: Complicated by cardiogenic shock postoperative hematoma requiring reexploration and secondary closure.  He has required reexploration of the sternal wound for recurrent bleeding and 2 days ago had debridement/application of wound VAC. 3.  Cardiogenic shock with ejection fraction of about 20%: Status post discontinuation of Impella 1/21.  Remains on high-dose midodrine for blood pressure support and noted to have intermittent hypotension from fentanyl. 4.  Acute blood loss anemia: With recurrent bleeding from sternal wound noted, transfusion triggers per TCTS. 5.  Ventilator dependent respiratory failure: Status post tracheostomy on 1/10.  Enteral nutrition via PEG tube.  Subjective:   Discussed with Dr. Aundra Dubin yesterday to try and formulate an action plan to help guide goals of care; will attempt to transition to intermittent hemodialysis from CRRT on Monday.  If he is unable to tolerate intermittent hemodialysis, will transition to comfort care measures.   Objective:   BP (!) 119/47   Pulse 81   Temp 97.9 F (36.6 C) (Axillary)   Resp (!) 24   Ht '6\' 3"'  (1.905 m)   Wt 81.1 kg   SpO2 100%   BMI 22.35 kg/m   Intake/Output Summary (Last 24 hours) at  03/04/2020 0747 Last data filed at 03/04/2020 0700 Gross per 24 hour  Intake 2984.62 ml  Output 2476 ml  Net 508.62 ml   Weight change: -1.1 kg  Physical Exam: Gen: Appears to be comfortable resting in bed on ventilator via tracheostomy CVS: Pulse regular rhythm, normal rate, S1 and S2 normal Resp: Anteriorly clear to auscultation without distinct rales or rhonchi Abd: Soft, flat, PEG tube in situ, getting tube feeds, bowel sounds normal Ext: Trace ankle edema, right femoral dialysis catheter  Imaging: DG Chest Port 1 View  Result Date: 03/04/2020 CLINICAL DATA:  Abnormal respirations. Status post vacuo a shin of sternal wound hematoma yesterday. EXAM: PORTABLE CHEST 1 VIEW COMPARISON:  One-view chest x-ray 03/03/2020. FINDINGS: Tracheostomy tube is stable. Heart is enlarged. Right apical pleural effusion is stable. Mild bibasilar atelectasis is unchanged. IMPRESSION: 1. Stable right apical pleural effusion. 2. Stable mild bibasilar atelectasis. 3. Stable tracheostomy tube. Electronically Signed   By: San Morelle M.D.   On: 03/04/2020 07:10   DG Chest Port 1 View  Result Date: 03/03/2020 CLINICAL DATA:  Tracheostomy check. EXAM: PORTABLE CHEST 1 VIEW COMPARISON:  March 02, 2020 FINDINGS: The tracheostomy tube is in good position. A right PICC line is stable. The distal tip is difficult to see but appears to terminate in the SVC. The AICD device is stable as well. No pneumothorax. The cardiomediastinal silhouette is stable. Mild atelectasis in the right base. The lungs are otherwise clear. Persistent thickening of the pleural stripe along the right apex laterally is stable. IMPRESSION: 1. Support apparatus as  above. 2. Continued thickening of the pleural stripe laterally in the right apex, unchanged, probably loculated fluid. 3. No other changes. Electronically Signed   By: Dorise Bullion III M.D   On: 03/03/2020 08:48    Labs: BMET Recent Labs  Lab 03/16/2020 0425 03/19/2020 1419  03/03/2020 0421 03/25/2020 1259 03/21/2020 1601 03/03/20 0408 03/03/20 1529 03/04/20 0412  NA 134* 129* 134* 135 133* 134* 135 136  135  K 4.2 4.8 4.2 4.9 4.8 4.5 4.3 4.5  4.5  CL 99 97* 101  --  103 103 102 104  103  CO2 '25 24 25  ' --  20* '22 23 23  23  ' GLUCOSE 97 327* 161*  --  221* 139* 170* 176*  175*  BUN 41* 38* 35*  --  41* 44* 50* 45*  46*  CREATININE 0.85 1.06 0.88  --  1.20 1.06 1.03 1.07  1.06  CALCIUM 8.6* 8.2* 8.2*  --  8.2* 8.0* 7.8* 8.2*  8.2*  PHOS 2.0* 3.5 2.1*  --  4.6 3.1 2.5 2.1*  2.1*   CBC Recent Labs  Lab 03/01/20 0425 03/12/2020 0421 03/26/2020 0807 03/15/2020 1259 03/03/20 0408 03/03/20 1345 03/04/20 0412  WBC 14.8* 15.2*  --   --  17.8*  --  13.2*  NEUTROABS 12.0* 12.5*  --   --  14.3*  --  10.7*  HGB 10.0* 9.5*   < > 9.5* 7.9* 8.2* 7.3*  HCT 34.3* 32.7*   < > 28.0* 25.6* 26.4* 24.7*  MCV 91.7 91.6  --   --  90.1  --  90.1  PLT 390 411*  --   --  412*  --  234   < > = values in this interval not displayed.    Medications:    . sodium chloride   Intravenous Once  . sodium chloride   Intravenous Once  . amiodarone  200 mg Per Tube BID  . vitamin C  500 mg Per Tube TID  . aspirin  324 mg Per Tube Daily  . atorvastatin  80 mg Per Tube Daily  . busPIRone  10 mg Per Tube TID  . chlorhexidine gluconate (MEDLINE KIT)  15 mL Mouth Rinse BID  . Chlorhexidine Gluconate Cloth  6 each Topical Daily  . clonazepam  1 mg Oral TID  . darbepoetin (ARANESP) injection - NON-DIALYSIS  100 mcg Subcutaneous Q Sat-1800  . docusate  100 mg Per Tube BID  . feeding supplement (PROSource TF)  45 mL Per Tube TID  . fentaNYL (SUBLIMAZE) injection  50 mcg Intravenous Once  . fiber  1 packet Per Tube BID  . FLUoxetine  40 mg Per Tube BID  . HYDROmorphone  4 mg Per Tube Q6H  . insulin aspart  0-9 Units Subcutaneous Q4H  . insulin aspart  4 Units Subcutaneous Q4H  . insulin detemir  12 Units Subcutaneous Q12H  . LORazepam  0.5 mg Intravenous Q4H  . mouth rinse  15 mL  Mouth Rinse 10 times per day  . melatonin  5 mg Per Tube Daily  . mexiletine  150 mg Per Tube Q8H  . midodrine  20 mg Per Tube TID WC  . multivitamin  1 tablet Per Tube QHS  . nutrition supplement (JUVEN)  1 packet Per Tube BID BM  . pantoprazole (PROTONIX) IV  40 mg Intravenous Q12H  . polyethylene glycol  17 g Per Tube Daily  . sodium chloride flush  10-40 mL Intracatheter Q12H  . thiamine  100 mg Per Tube Daily  . valproic acid  250 mg Per Tube BID   Elmarie Shiley, MD 03/04/2020, 7:47 AM

## 2020-03-04 NOTE — Progress Notes (Signed)
NAME:  Corey Palmer, MRN:  [id number], DOB:  December 17, 1970, LOS: 58 ADMISSION DATE:  01/27/2020, CONSULTATION DATE: 01/20/2020 REFERRING MD: Aundra Dubin CHIEF COMPLAINT: Respiratory failure post Impella  HPI/Course in hospital  50 year old man admitted 12/1 with A. fib/RVR, torsades, cardiogenic shock [EF 15] requiring Impella placement (removed 1/21), CABG x 4 c/b cardiac tamponade requiring chest reopening (now with VAC), AKI requiring on CRRT.  Past Medical History:    has a past medical history of Anginal pain ([place]), Anxiety, Arthritis, Automatic implantable cardioverter-defibrillator in situ (2007), Bipolar disorder ([place]), CAD (coronary artery disease), Cancer ([place]) (2013), CHF (congestive heart failure) ([place]), Chondrocalcinosis of right knee (6/46/8032), Chronic systolic heart failure ([place]), Depression, Diabetes uncomplicated adult-type II, Dilated cardiomyopathy ([place]), Dyslipidemia, Dysrhythmia, GERD (gastroesophageal reflux disease), Gout, unspecified, Hepatic steatosis (2011), History of kidney stones, echocardiogram, Hypertension, Obesity (BMI 30.0-34.9), Pacemaker, Paroxysmal atrial fibrillation ([place]), Peptic ulcer, Shortness of breath, and Sleep apnea.  Significant Hospital Events:  12/2 Afib with RVR. Cardioverted in ED. AICD did not fire. Milrinone for low co ox.  12/7 ICD fired, Torsades- likely from low K of 2.8.  12/8 Cath- multivessel disease w/ low CO. Volume overloaded.  12/11 Underwent Impella 5.5 insertion for persistent symptoms of forward failure with low cardiac indices. 12/12 Back to the OR for displacement of Impella. Extubated.  12/15 CABG x4, with LIMA-LAD, SVG-PDA/PLV, SVG-OM 12/16 Emergently opened chest at bedside for tamponade, clot compressing right atrium  12/16 To OR for Exploration of chest due to bleeding  12/19 Started CRRT 12/23 Sternal Closure in OR 12/24 Worsening shock.  Chest reopened at bedside with improvement in hemodynamics.  No mediastinal  hematoma. 12/25 A flutter > rapid a paced to sinus rhythm.  Remains on high-dose pressors, inotropes 12/28 Weaning vasopressor requirements.  For chest washout today.  Back in atrial fibrillation, refractory to increased amiodarone and attempted overdrive pacing.  DCCV to NSR. Tolerating fluid removal via CRRT. 1/03 Appears euvolemic.  Adjusting glycemic control.  Chest still open so not weaning 1/04 Spiking fever, Vanco meropenem resumed after culture sent from blood and sputum. 1/05 Awaiting return to the OR for wound VAC dressing assessment fever curve and white blood cell count improving after antibiotics resumed the day prior, did have chest closed, developed tamponade physiology shortly after had to go back for emergent reopening of chest, return to intensive care and refractory shock 1/06 Wound VAC dressing replaced.  Still in shock.  Starting to wean sedation 1/07 Still on high-dose pressors, CRRT 1/15 Recurrent Vtach, shocked 1/18 Attempted pressure change to P 3 with addition of Dobutamine . MAP goal > 60 1/19 Ongoing attempts at impella wean, on PS 1/20 Stable on vent, following commands with sedation weaned, continued Impella wean attempt 1/21 Impella removed, on FloTrac, WBC 22K, pancultured, Cefepime/Vanc started empirically for low SVR, concern for sepsis 1/22 antifungals added Impella removed on 1/21, 3 PM CT x3 in place 1/27 OR with plastics for debridement, ACell, partial closure 1/28 plan to stop CRRT when clots off 1/29 CRRT didn't clot off, continues on 1/31 CRRT ongoing, tolerated PSV 16/8 for several hours.    Consults:  PCCM, nephrology, cardiology, cardiothoracic surgery, EP  Procedures:  R PICC 12/2 >> R axillary Impella 5.5 12/11 >> 1/21 ETT 12/15 >> 1/10 R femoral arterial sheath 12/15 >> 12/31 R femoral HD cath 12/19 >> 12/31 Chest tube - right pleural, left pleural , mediastinal 12/23 >> 1/5 R Radial A-Line 1/6 >> R fem HD > Chest tube - L pleural,  R  pleural, mediastinal 1/10 >> 1/30 Trach 1/10 > PEG 1/10 > 03/12/2020 partial closure of sternal wound with placement of wound VAC plastic surgery Significant Diagnostic Tests:  12/8 LHC  Elevated R and L heart filling pressures with low CO, severe 3-vessel disease 1/16 echo > Severe biventricular failure. No significant effusion. Pacing wire in  RV. Markedly abnormal septal motion. Micro Data:  1/19 BCx2 -no growth at 3 days 1/21 OR Cx > staph epi  Antimicrobials:  Levaquin 1/21 (periop ppx) Meropenem 1/21>1/29 Vanc 1/21 > off Anidulafungin 1/22>  1/29  Interval history:  No events Back on full vent support Tentative plans to try iHD tomorrow or Monday Son to arrive Tuesday  Objective   Blood pressure (!) 119/47, pulse 81, temperature 98.1 F (36.7 C), temperature source Axillary, resp. rate (!) 24, height 6\' 3"  (1.905 m), weight 81.1 kg, SpO2 100 %. CVP:  [0 mmHg-7 mmHg] 3 mmHg  Vent Mode: PRVC FiO2 (%):  [30 %] 30 % Set Rate:  [24 bmp] 24 bmp Vt Set:  [510 mL] 510 mL PEEP:  [5 cmH20] 5 cmH20 Pressure Support:  [5 cmH20] 5 cmH20 Plateau Pressure:  [16 cmH20-21 cmH20] 21 cmH20   Intake/Output Summary (Last 24 hours) at 03/04/2020 6644 Last data filed at 03/04/2020 0700 Gross per 24 hour  Intake 2878.58 ml  Output 1958 ml  Net 920.58 ml   Filed Weights   02/29/2020 0422 03/03/20 0418 03/04/20 0358  Weight: 81.3 kg 82.2 kg 81.1 kg   CVP:  [0 mmHg-7 mmHg] 3 mmHg   Examination:   Constitutional: fraily ill appearing man on vent  Eyes: pupils equal, does track Ears, nose, mouth, and throat: MM dry, secretions pooled in back of throat Cardiovascular: RRR, sternal vac in place, ext warm Respiratory: significant rhonci bilaterally, no  Gastrointestinal: soft, +BS, rectal tube in place Skin: No rashes, normal turgor Neurologic: moves ext to repeated stimulation Psychiatric: RASS -2, received PRNs for anxiety overnight  SvO2 68% CXR stable BMP stable on CRRT WBC  improved H/H down a bit  Assessment & Plan:   Acute hypoxic respiratory failure requiring MV, s/p trach 1/10 Possible HCAP s/p tx with meropenem/eraxis Small R pleural effusion, persistent  Open sternal wound with partial closure 03/02/2020 03/2020 increased in drainage from sternal wound site with increasing size of suspected hematoma under wound- Taken to the OR 03/06/2020 per CVTS Stabilized return to unit transfused x2 units  Cardiogenic shock-s/p impella, removed 1/21, s/p CABG c/b tamponade and multiple chest re-openings, prolonged open chest , s/p excision/debridement + ACell application, partial closure with plastics 1/27 MRSE sternal wound infection Afib RVR, recurrent VT, Torsades -- improved  Acute renal failure in CKD IIIa requiring RRT Cardiorenal syndrome   - Full vent support while we determine GOC with family - Add back ativan and fentanyl gtt PRN - If he tolerates iHD we can start working toward PS and TC again - Will follow  Daily Goals Checklist  Pain/Anxiety/Delirium protocol (if indicated): fentanyl gtt, PRN dilaudid, ativan, klonipin VAP protocol (if indicated): yes DVT prophylaxis:  Heparin gtt Nutrition Status: TF GI prophylaxis: Pantoprazole Glucose control: Insulin (Basal, Standing, SSI) Mobility/therapy needs: per primary Code Status: Full  Family Communication: per primary-- palliative also updating  Disposition: ICU   Patient critically ill due to respiratory failure Interventions to address this today ventilator titration Risk of deterioration without these interventions is high  I personally spent 34 minutes providing critical care not including any separately billable procedures  Erskine Emery MD [facility] 02/28/2020 7:23 AM Prefer epic messenger for cross cover needs If after hours, please call E-link

## 2020-03-05 DIAGNOSIS — J9601 Acute respiratory failure with hypoxia: Secondary | ICD-10-CM | POA: Diagnosis not present

## 2020-03-05 DIAGNOSIS — R570 Cardiogenic shock: Secondary | ICD-10-CM | POA: Diagnosis not present

## 2020-03-05 LAB — CBC WITH DIFFERENTIAL/PLATELET
Abs Immature Granulocytes: 0.05 10*3/uL (ref 0.00–0.07)
Basophils Absolute: 0 10*3/uL (ref 0.0–0.1)
Basophils Relative: 0 %
Eosinophils Absolute: 0.3 10*3/uL (ref 0.0–0.5)
Eosinophils Relative: 3 %
HCT: 28 % — ABNORMAL LOW (ref 39.0–52.0)
Hemoglobin: 8.1 g/dL — ABNORMAL LOW (ref 13.0–17.0)
Immature Granulocytes: 1 %
Lymphocytes Relative: 8 %
Lymphs Abs: 0.8 10*3/uL (ref 0.7–4.0)
MCH: 27.3 pg (ref 26.0–34.0)
MCHC: 28.9 g/dL — ABNORMAL LOW (ref 30.0–36.0)
MCV: 94.3 fL (ref 80.0–100.0)
Monocytes Absolute: 0.7 10*3/uL (ref 0.1–1.0)
Monocytes Relative: 7 %
Neutro Abs: 8 10*3/uL — ABNORMAL HIGH (ref 1.7–7.7)
Neutrophils Relative %: 81 %
Platelets: 203 10*3/uL (ref 150–400)
RBC: 2.97 MIL/uL — ABNORMAL LOW (ref 4.22–5.81)
RDW: 19.7 % — ABNORMAL HIGH (ref 11.5–15.5)
WBC: 9.9 10*3/uL (ref 4.0–10.5)
nRBC: 0 % (ref 0.0–0.2)

## 2020-03-05 LAB — GLUCOSE, CAPILLARY
Glucose-Capillary: 196 mg/dL — ABNORMAL HIGH (ref 70–99)
Glucose-Capillary: 164 mg/dL — ABNORMAL HIGH (ref 70–99)
Glucose-Capillary: 196 mg/dL — ABNORMAL HIGH (ref 70–99)
Glucose-Capillary: 156 mg/dL — ABNORMAL HIGH (ref 70–99)
Glucose-Capillary: 170 mg/dL — ABNORMAL HIGH (ref 70–99)

## 2020-03-05 LAB — RENAL FUNCTION PANEL
Albumin: 2 g/dL — ABNORMAL LOW (ref 3.5–5.0)
Albumin: 2.1 g/dL — ABNORMAL LOW (ref 3.5–5.0)
Anion gap: 7 (ref 5–15)
Anion gap: 7 (ref 5–15)
BUN: 42 mg/dL — ABNORMAL HIGH (ref 6–20)
BUN: 48 mg/dL — ABNORMAL HIGH (ref 6–20)
CO2: 25 mmol/L (ref 22–32)
CO2: 26 mmol/L (ref 22–32)
Calcium: 8.2 mg/dL — ABNORMAL LOW (ref 8.9–10.3)
Calcium: 8.1 mg/dL — ABNORMAL LOW (ref 8.9–10.3)
Chloride: 102 mmol/L (ref 98–111)
Chloride: 101 mmol/L (ref 98–111)
Creatinine, Ser: 0.96 mg/dL (ref 0.61–1.24)
Creatinine, Ser: 0.99 mg/dL (ref 0.61–1.24)
GFR, Estimated: >60 mL/min (ref >60)
GFR, Estimated: >60 mL/min (ref >60)
Glucose, Bld: 208 mg/dL — ABNORMAL HIGH (ref 70–99)
Glucose, Bld: 194 mg/dL — ABNORMAL HIGH (ref 70–99)
Phosphorus: 1.9 mg/dL — ABNORMAL LOW (ref 2.5–4.6)
Phosphorus: 1.5 mg/dL — ABNORMAL LOW (ref 2.5–4.6)
Potassium: 4.3 mmol/L (ref 3.5–5.1)
Potassium: 4.9 mmol/L (ref 3.5–5.1)
Sodium: 134 mmol/L — ABNORMAL LOW (ref 135–145)
Sodium: 134 mmol/L — ABNORMAL LOW (ref 135–145)

## 2020-03-05 LAB — COOXEMETRY PANEL
Carboxyhemoglobin: 2.3 % — ABNORMAL HIGH (ref 0.5–1.5)
Methemoglobin: 0.9 % (ref 0.0–1.5)
O2 Saturation: 74 %
Total hemoglobin: 7.9 g/dL — ABNORMAL LOW (ref 12.0–16.0)

## 2020-03-05 LAB — HEPARIN LEVEL (UNFRACTIONATED): Heparin Unfractionated: 0.27 IU/mL — ABNORMAL LOW (ref 0.30–0.70)

## 2020-03-05 LAB — MAGNESIUM: Magnesium: 2.4 mg/dL (ref 1.7–2.4)

## 2020-03-05 LAB — MRSA PCR SCREENING: MRSA by PCR: NEGATIVE

## 2020-03-05 MED ORDER — CHLORHEXIDINE GLUCONATE 0.12 % MT SOLN
OROMUCOSAL | Status: AC
  Administered 2020-03-05: 15 mL via OROMUCOSAL
  Filled 2020-03-05: qty 15

## 2020-03-05 NOTE — Progress Notes (Signed)
NAME:  Corey Palmer, MRN:  [id number], DOB:  09/25/1970, LOS: 23 ADMISSION DATE:  01/16/2020, CONSULTATION DATE: 01/04/2020 REFERRING MD: Aundra Dubin CHIEF COMPLAINT: Respiratory failure post Impella  HPI/Course in hospital  50 year old man admitted 12/1 with A. fib/RVR, torsades, cardiogenic shock [EF 15] requiring Impella placement (removed 1/21), CABG x 4 c/b cardiac tamponade requiring chest reopening (now with VAC), AKI requiring on CRRT.  Past Medical History:    has a past medical history of Anginal pain ([place]), Anxiety, Arthritis, Automatic implantable cardioverter-defibrillator in situ (2007), Bipolar disorder ([place]), CAD (coronary artery disease), Cancer ([place]) (2013), CHF (congestive heart failure) ([place]), Chondrocalcinosis of right knee (8/92/1194), Chronic systolic heart failure ([place]), Depression, Diabetes uncomplicated adult-type II, Dilated cardiomyopathy ([place]), Dyslipidemia, Dysrhythmia, GERD (gastroesophageal reflux disease), Gout, unspecified, Hepatic steatosis (2011), History of kidney stones, echocardiogram, Hypertension, Obesity (BMI 30.0-34.9), Pacemaker, Paroxysmal atrial fibrillation ([place]), Peptic ulcer, Shortness of breath, and Sleep apnea.  Significant Hospital Events:  12/2 Afib with RVR. Cardioverted in ED. AICD did not fire. Milrinone for low co ox.  12/7 ICD fired, Torsades- likely from low K of 2.8.  12/8 Cath- multivessel disease w/ low CO. Volume overloaded.  12/11 Underwent Impella 5.5 insertion for persistent symptoms of forward failure with low cardiac indices. 12/12 Back to the OR for displacement of Impella. Extubated.  12/15 CABG x4, with LIMA-LAD, SVG-PDA/PLV, SVG-OM 12/16 Emergently opened chest at bedside for tamponade, clot compressing right atrium  12/16 To OR for Exploration of chest due to bleeding  12/19 Started CRRT 12/23 Sternal Closure in OR 12/24 Worsening shock.  Chest reopened at bedside with improvement in hemodynamics.  No mediastinal  hematoma. 12/25 A flutter > rapid a paced to sinus rhythm.  Remains on high-dose pressors, inotropes 12/28 Weaning vasopressor requirements.  For chest washout today.  Back in atrial fibrillation, refractory to increased amiodarone and attempted overdrive pacing.  DCCV to NSR. Tolerating fluid removal via CRRT. 1/03 Appears euvolemic.  Adjusting glycemic control.  Chest still open so not weaning 1/04 Spiking fever, Vanco meropenem resumed after culture sent from blood and sputum. 1/05 Awaiting return to the OR for wound VAC dressing assessment fever curve and white blood cell count improving after antibiotics resumed the day prior, did have chest closed, developed tamponade physiology shortly after had to go back for emergent reopening of chest, return to intensive care and refractory shock 1/06 Wound VAC dressing replaced.  Still in shock.  Starting to wean sedation 1/07 Still on high-dose pressors, CRRT 1/15 Recurrent Vtach, shocked 1/18 Attempted pressure change to P 3 with addition of Dobutamine . MAP goal > 60 1/19 Ongoing attempts at impella wean, on PS 1/20 Stable on vent, following commands with sedation weaned, continued Impella wean attempt 1/21 Impella removed, on FloTrac, WBC 22K, pancultured, Cefepime/Vanc started empirically for low SVR, concern for sepsis 1/22 antifungals added Impella removed on 1/21, 3 PM CT x3 in place 1/27 OR with plastics for debridement, ACell, partial closure 1/28 plan to stop CRRT when clots off 1/29 CRRT didn't clot off, continues on 1/31 CRRT ongoing, tolerated PSV 16/8 for several hours.  1/31 onward: vent wean, another washout, now [place] discussions  Consults:  PCCM, nephrology, cardiology, cardiothoracic surgery, EP  Procedures:  R PICC 12/2 >> R axillary Impella 5.5 12/11 >> 1/21 ETT 12/15 >> 1/10 R femoral arterial sheath 12/15 >> 12/31 R femoral HD cath 12/19 >> 12/31 Chest tube - right pleural, left pleural , mediastinal 12/23 >> 1/5 R  Radial A-Line 1/6 >>  R fem HD > Chest tube - L pleural, R pleural, mediastinal 1/10 >> 1/30 Trach 1/10 > PEG 1/10 > 03/14/2020 partial closure of sternal wound with placement of wound VAC plastic surgery Significant Diagnostic Tests:  12/8 LHC  Elevated R and L heart filling pressures with low CO, severe 3-vessel disease 1/16 echo > Severe biventricular failure. No significant effusion. Pacing wire in  RV. Markedly abnormal septal motion. Micro Data:  1/19 BCx2 -no growth at 3 days 1/21 OR Cx > staph epi  Antimicrobials:  Levaquin 1/21 (periop ppx) Meropenem 1/21>1/29 Vanc 1/21 > off Anidulafungin 1/22>  1/29  Interval history:  No events On full support Looks very comfortable this AM, denies pain or SOB.  Objective   Blood pressure 108/62, pulse 82, temperature 97.6 F (36.4 C), temperature source Oral, resp. rate (!) 27, height 6\' 3"  (1.905 m), weight 81.4 kg, SpO2 100 %. CVP:  [3 mmHg-7 mmHg] 5 mmHg  Vent Mode: PRVC FiO2 (%):  [30 %] 30 % Set Rate:  [24 bmp] 24 bmp Vt Set:  [510 mL] 510 mL PEEP:  [5 cmH20] 5 cmH20 Plateau Pressure:  [17 cmH20-25 cmH20] 17 cmH20   Intake/Output Summary (Last 24 hours) at 03/05/2020 0850 Last data filed at 03/05/2020 0800 Gross per 24 hour  Intake 3286.49 ml  Output 3130 ml  Net 156.49 ml   Filed Weights   03/03/20 0418 03/04/20 0358 03/05/20 0500  Weight: 82.2 kg 81.1 kg 81.4 kg   CVP:  [3 mmHg-7 mmHg] 5 mmHg   Examination:   Constitutional: no acute distress, chronically ill appearing  Eyes: EOMI, pupils equal Ears, nose, mouth, and throat: MM dry, trach in place, with some minor pressure related injury at 6 oclock Cardiovascular: RRR, ext warm Respiratory: scattered crackles, passive on vent Gastrointestinal: soft, +BS Skin: No rashes, normal turgor Neurologic: moves all 4 ext, profoundly weak Psychiatric: RASS 0  Co-ox improved Net even on CRRT Labs benign CBG looks good  Assessment & Plan:   Acute hypoxic respiratory  failure requiring MV, s/p trach 1/10 Possible HCAP s/p tx with meropenem/eraxis Small R pleural effusion, persistent  Open sternal wound with partial closure 03/26/2020 03/2020 increased in drainage from sternal wound site with increasing size of suspected hematoma under wound- Taken to the OR 03/01/2020 per CVTS Stabilized return to unit transfused x2 units  Cardiogenic shock-s/p impella, removed 1/21, s/p CABG c/b tamponade and multiple chest re-openings, prolonged open chest , s/p excision/debridement + ACell application, partial closure with plastics 1/27 MRSE sternal wound infection Afib RVR, recurrent VT, Torsades -- improved  Acute renal failure in CKD IIIa requiring RRT Cardiorenal syndrome   - Full vent support with fentanyl and ativan as ordered, he looks very comfortable this am - Tentative plans for iHD trial tomorrow, if fails, brother is coming in from [place];  Tuesday to discuss [place] with wife and patient - Would not push vent wean until we see how he does with the iHD  Daily Goals Checklist  Pain/Anxiety/Delirium protocol (if indicated): as above VAP protocol (if indicated): yes DVT prophylaxis:  Heparin gtt Nutrition Status: TF GI prophylaxis: Pantoprazole Glucose control: Insulin (Basal, Standing, SSI) Mobility/therapy needs: per primary Code Status: Full  Family Communication: per primary-- palliative also updating  Disposition: ICU   Patient critically ill due to respiratory failure Interventions to address this today ventilator titration Risk of deterioration without these interventions is high  I personally spent 35 minutes providing critical care not including any separately billable procedures  Erskine Emery MD [facility] 02/28/2020 7:23 AM Prefer epic messenger for cross cover needs If after hours, please call E-link

## 2020-03-05 NOTE — Progress Notes (Signed)
Patient ID: Corey Palmer, male   DOB: 10/25/1970, 50 y.o.   MRN: [id number] [facility] Progress Note   Assessment/ Plan:   1. Acute kidney Injury on chronic kidney disease stage III: By definition-now end-stage renal disease given prolonged dialysis dependency (on CRRT since 01/16/2020).  Etiology suspected to be ischemic ATN in the setting of cardiogenic shock status post CABG. Anuric and on CRRT for renal replacement therapy at this time.  We will stop CRRT tomorrow morning and attempt intermittent hemodialysis on 2/8; this is around the time when his son will be coming to see him from out of town.  If unable to tolerate IHD, will transition to comfort care. 2.  Coronary artery disease status post CABG: Complicated by cardiogenic shock postoperative hematoma requiring reexploration and secondary closure.  He has required reexploration of the sternal wound for recurrent bleeding and 2 days ago had debridement/application of wound VAC. 3.  Cardiogenic shock with ejection fraction of about 20%: Status post discontinuation of Impella 1/21.  Remains on high-dose midodrine for blood pressure support.  On fentanyl drip. 4.  Acute blood loss anemia: With recurrent bleeding from sternal wound noted, transfusion triggers per TCTS. 5.  Ventilator dependent respiratory failure: Status post tracheostomy on 1/10.  Enteral nutrition via PEG tube.  Subjective:   No acute events noted overnight.  Net fluid even.   Objective:   BP (!) 92/55   Pulse 79   Temp 97.6 F (36.4 C) (Oral)   Resp (!) 26   Ht '6\' 3"'  (1.905 m)   Wt 81.4 kg   SpO2 100%   BMI 22.43 kg/m   Intake/Output Summary (Last 24 hours) at 03/05/2020 0802 Last data filed at 03/05/2020 0700 Gross per 24 hour  Intake 3179.98 ml  Output 3034 ml  Net 145.98 ml   Weight change: 0.3 kg  Physical Exam: Gen: Resting comfortably on ventilator via tracheostomy.  Opens eyes and attempts to mouth words when I talk to him. CVS: Pulse  regular rhythm, normal rate, S1 and S2 normal Resp: Anteriorly clear to auscultation without distinct rales or rhonchi Abd: Soft, flat, PEG tube in situ, getting tube feeds, bowel sounds normal Ext: Without lower extremity edema, right femoral dialysis catheter connected to CRRT  Imaging: DG Chest Port 1 View  Result Date: 03/04/2020 CLINICAL DATA:  Abnormal respirations. Status post vacuo a shin of sternal wound hematoma yesterday. EXAM: PORTABLE CHEST 1 VIEW COMPARISON:  One-view chest x-ray 03/03/2020. FINDINGS: Tracheostomy tube is stable. Heart is enlarged. Right apical pleural effusion is stable. Mild bibasilar atelectasis is unchanged. IMPRESSION: 1. Stable right apical pleural effusion. 2. Stable mild bibasilar atelectasis. 3. Stable tracheostomy tube. Electronically Signed   By: San Morelle M.D.   On: 03/04/2020 07:10    Labs: BMET Recent Labs  Lab 03/15/2020 0421 03/14/2020 1259 03/16/2020 1601 03/03/20 0408 03/03/20 1529 03/04/20 0412 03/04/20 1542 03/05/20 0357  NA 134* 135 133* 134* 135 136  135 133* 134*  K 4.2 4.9 4.8 4.5 4.3 4.5  4.5 4.1 4.3  CL 101  --  103 103 102 104  103 102 102  CO2 25  --  20* '22 23 23  23 24 25  ' GLUCOSE 161*  --  221* 139* 170* 176*  175* 182* 208*  BUN 35*  --  41* 44* 50* 45*  46* 47* 42*  CREATININE 0.88  --  1.20 1.06 1.03 1.07  1.06 1.01 0.96  CALCIUM 8.2*  --  8.2*  8.0* 7.8* 8.2*  8.2* 8.0* 8.2*  PHOS 2.1*  --  4.6 3.1 2.5 2.1*  2.1* 2.0* 1.9*   CBC Recent Labs  Lab 03/16/2020 0421 03/13/2020 0807 03/03/20 0408 03/03/20 1345 03/04/20 0412 03/04/20 1354 03/05/20 0357  WBC 15.2*  --  17.8*  --  13.2* 13.5* 9.9  NEUTROABS 12.5*  --  14.3*  --  10.7*  --  8.0*  HGB 9.5*   < > 7.9* 8.2* 7.3* 8.2* 8.1*  HCT 32.7*   < > 25.6* 26.4* 24.7* 27.1* 28.0*  MCV 91.6  --  90.1  --  90.1 90.6 94.3  PLT 411*  --  412*  --  234 218 203   < > = values in this interval not displayed.    Medications:    . amiodarone  200 mg Per  Tube BID  . vitamin C  500 mg Per Tube TID  . aspirin  324 mg Per Tube Daily  . atorvastatin  80 mg Per Tube Daily  . busPIRone  10 mg Per Tube TID  . chlorhexidine gluconate (MEDLINE KIT)  15 mL Mouth Rinse BID  . Chlorhexidine Gluconate Cloth  6 each Topical Daily  . clonazepam  1 mg Oral TID  . darbepoetin (ARANESP) injection - NON-DIALYSIS  100 mcg Subcutaneous Q Sat-1800  . docusate  100 mg Per Tube BID  . feeding supplement (PROSource TF)  45 mL Per Tube TID  . fentaNYL (SUBLIMAZE) injection  50 mcg Intravenous Once  . fiber  1 packet Per Tube BID  . FLUoxetine  40 mg Per Tube BID  . HYDROmorphone  4 mg Per Tube Q6H  . insulin aspart  0-9 Units Subcutaneous Q4H  . insulin aspart  4 Units Subcutaneous Q4H  . insulin detemir  12 Units Subcutaneous Q12H  . LORazepam  0.5 mg Intravenous Q4H  . mouth rinse  15 mL Mouth Rinse 10 times per day  . melatonin  5 mg Per Tube Daily  . mexiletine  150 mg Per Tube Q8H  . midodrine  20 mg Per Tube TID WC  . multivitamin  1 tablet Per Tube QHS  . nutrition supplement (JUVEN)  1 packet Per Tube BID BM  . pantoprazole (PROTONIX) IV  40 mg Intravenous Q12H  . polyethylene glycol  17 g Per Tube Daily  . sodium chloride flush  10-40 mL Intracatheter Q12H  . thiamine  100 mg Per Tube Daily  . valproic acid  250 mg Per Tube BID   Elmarie Shiley, MD 03/05/2020, 8:02 AM

## 2020-03-05 NOTE — Progress Notes (Signed)
[facility] for Heparin Indication: Afib  Allergies  Allergen Reactions  . Orange Fruit Anaphylaxis, Hives and Other (See Comments)    Per Pt- Blisters around lips and Hives all over, also  . Penicillins Anaphylaxis    Did it involve swelling of the face/tongue/throat, SOB, or low BP? Yes Did it involve sudden or severe rash/hives, skin peeling, or any reaction on the inside of your mouth or nose? Yes Did you need to seek medical attention at a hospital or doctor's office? No When did it last happen?childhood If all above answers are "NO", may proceed with cephalosporin use.  [name] [Empagliflozin] Itching  . Basaglar Kwikpen [Insulin Glargine] Nausea And Vomiting    Patient Measurements: Height: 6\' 3"  (190.5 cm) Weight: 81.4 kg (179 lb 7.3 oz) IBW/kg (Calculated) : 84.5 Heparin dosing wt: 101 kg  Vital Signs: Temp: 97.6 F (36.4 C) (02/06 0729) Temp Source: Oral (02/06 0729) BP: 108/62 (02/06 0800) Pulse Rate: 82 (02/06 0813)  Labs: Recent Labs    03/04/20 0412 03/04/20 1354 03/04/20 1542 03/04/20 1739 03/05/20 0357  HGB 7.3* 8.2*  --   --  8.1*  HCT 24.7* 27.1*  --   --  28.0*  PLT 234 218  --   --  203  HEPARINUNFRC  --   --   --  0.25* 0.27*  CREATININE 1.07  1.06  --  1.01  --  0.96    Estimated Creatinine Clearance: 104.8 mL/min (by C-G formula based on SCr of 0.96 mg/dL).   Assessment: 55 yoM with hx PAF on apixaban PTA. Pt admitted with shock and required Impella 5.5 support. CABG 12/15 with chest left open, c/b hematoma and significant bleeding. Ultimately heparin started via purge for Impella. DCCV performed 12/16, partial chest closure 1/5, Impella removed 1/21. Heparin resumed for AFib 1/22 and stopped 2/3 due to bleeding from wound vac. Low dose heparin restarted 2/5. -heparin level= 0.27 -Hg= 8.1  Goal of Therapy:  Heparin level ~0.3  Monitor platelets by anticoagulation protocol: Yes   Plan:  -No  heparin adjustments since heparin level is close to goal -Daily heparin level and CBC  Hildred Laser, PharmD Clinical Pharmacist **Pharmacist phone directory can now be found on [email or web address] ([email or web address]).  Listed under [place].

## 2020-03-05 NOTE — Progress Notes (Addendum)
Patient ID: Corey Palmer, male   DOB: 1970/03/19, 50 y.o.   MRN: [id number]     Advanced Heart Failure Rounding Note  PCP-Cardiologist: No primary care provider on file.   Subjective:    - 12/7 Torsades -ICD shock x1.  - 12/11 Impella 5.5 placed.  - 12/12 Impella repositioned in OR - 12/15 CABG x 4 with LIMA-LAD, SVG-PDA/PLV, SVG-OM - 12/16 DCCV afib.  Back to OR to re-open chest and again to evacuate hematoma.  - 12/19 CVVH initiated  - 12/23 Chest closed - 12/24 Chest reopened - 12/26 RAP back into NSR - 12/27 back in atrial fibrillation, unable to rapid atrial pace out.   - 12/28 To OR, chest partially closed and wound vac placed.  DCCV to NSR.  - 12/31 Antibiotics stopped. Swan removed, HD catheter moved from right femoral to right IJ.  - 1/5 To OR for chest closure. Chest reopened emergently at bedside again to evacuate hematoma  - 1/10 To OR: omental flap closure of chest with wound vac, tracheostomy, G-tube, J-tube.    - 1/12 Plastics and EP consulted.  - 1/13 Palliative Care consulted.  - 1/15 Went into VT, Shock 200 J and amiodarone bolus 150.   - 1/16: Milrinone stopped.    - 1/17: Partial closure of chest in OR - 1/18 Switched to dobutamine 4 mcg - 1/21 Impella Extracted.  Developed hyperkalemia when CVVH held with wide QRS rhythm and required transient external pacing.   - 1/27 OR for wound vac change, partial chest wall closure.  - 2/2 S/P sternal debridement , closure part of sternal wound, A cell  - 2/3 Return to OR for re-exploration of sternal wound due to bleeding.   Remains on dobutamine 4 mcg/kg/min. CVVH running even to slightly positive. CVP 5.  Co-ox 74 %.    He restarted heparin gtt, Hgb 8.1 today.      Objective:   Weight Range: 81.1 kg Body mass index is 22.35 kg/m.   Vital Signs:   Temp:  [96.6 F (35.9 C)-98.3 F (36.8 C)] 97.8 F (36.6 C) (02/06 0000) Pulse Rate:  [74-98] 81 (02/06 0300) Resp:  [0-28] 24 (02/06 0300) BP:  (86-130)/(41-70) 95/65 (02/06 0300) SpO2:  [100 %] 100 % (02/06 0300) Arterial Line BP: (106-134)/(43-58) 115/47 (02/06 0300) FiO2 (%):  [30 %] 30 % (02/06 0000) Weight:  [81.1 kg] 81.1 kg (02/05 0358) Last BM Date: 03/04/20  Weight change: Filed Weights   03/07/2020 0422 03/03/20 0418 03/04/20 0358  Weight: 81.3 kg 82.2 kg 81.1 kg    Intake/Output:   Intake/Output Summary (Last 24 hours) at 03/05/2020 0327 Last data filed at 03/05/2020 0300 Gross per 24 hour  Intake 3243.52 ml  Output 2940 ml  Net 303.52 ml      Physical Exam   CVP 5 General:  Chronic ill appearing.  HEENT: Dry MM.  Neck: supple. Trach.   Cor: Regular rate & rhythm.  Lungs: Bilateral coarse breath sounds.  Abdomen: soft, nontender, nondistended.Good bowel sounds. Extremities: no cyanosis, clubbing, rash.  Dependent edema Neuro: alert, flat affect.     Telemetry   SR with run of NSVT.  Personally reviewed.   Labs    CBC Recent Labs    03/03/20 0408 03/03/20 1345 03/04/20 0412 03/04/20 1354  WBC 17.8*  --  13.2* 13.5*  NEUTROABS 14.3*  --  10.7*  --   HGB 7.9*   < > 7.3* 8.2*  HCT 25.6*   < > 24.7* 27.1*  MCV 90.1  --  90.1 90.6  PLT 412*  --  234 218   < > = values in this interval not displayed.   Basic Metabolic Panel Recent Labs    03/03/20 0408 03/03/20 1529 03/04/20 0412 03/04/20 1542  NA 134*   < > 136  135 133*  K 4.5   < > 4.5  4.5 4.1  CL 103   < > 104  103 102  CO2 22   < > '23  23 24  ' GLUCOSE 139*   < > 176*  175* 182*  BUN 44*   < > 45*  46* 47*  CREATININE 1.06   < > 1.07  1.06 1.01  CALCIUM 8.0*   < > 8.2*  8.2* 8.0*  MG 2.5*  --  2.4  --   PHOS 3.1   < > 2.1*  2.1* 2.0*   < > = values in this interval not displayed.   Liver Function Tests Recent Labs    03/04/20 0412 03/04/20 1542  ALBUMIN 2.3* 2.1*   No results for input(s): LIPASE, AMYLASE in the last 72 hours. Cardiac Enzymes No results for input(s): CKTOTAL, CKMB, CKMBINDEX, TROPONINI in the  last 72 hours.  BNP: BNP (last 3 results) Recent Labs    01/07/2020 1619  BNP 577.5*    ProBNP (last 3 results) No results for input(s): PROBNP in the last 8760 hours.   D-Dimer No results for input(s): DDIMER in the last 72 hours. Hemoglobin A1C No results for input(s): HGBA1C in the last 72 hours. Fasting Lipid Panel No results for input(s): CHOL, HDL, LDLCALC, TRIG, CHOLHDL, LDLDIRECT in the last 72 hours. Thyroid Function Tests No results for input(s): TSH, T4TOTAL, T3FREE, THYROIDAB in the last 72 hours.  Invalid input(s): FREET3  Other results:   Imaging    DG Chest Port 1 View  Result Date: 03/04/2020 CLINICAL DATA:  Abnormal respirations. Status post vacuo a shin of sternal wound hematoma yesterday. EXAM: PORTABLE CHEST 1 VIEW COMPARISON:  One-view chest x-ray 03/03/2020. FINDINGS: Tracheostomy tube is stable. Heart is enlarged. Right apical pleural effusion is stable. Mild bibasilar atelectasis is unchanged. IMPRESSION: 1. Stable right apical pleural effusion. 2. Stable mild bibasilar atelectasis. 3. Stable tracheostomy tube. Electronically Signed   By: San Morelle M.D.   On: 03/04/2020 07:10     Medications:     Scheduled Medications: . sodium chloride   Intravenous Once  . sodium chloride   Intravenous Once  . amiodarone  200 mg Per Tube BID  . vitamin C  500 mg Per Tube TID  . aspirin  324 mg Per Tube Daily  . atorvastatin  80 mg Per Tube Daily  . busPIRone  10 mg Per Tube TID  . chlorhexidine gluconate (MEDLINE KIT)  15 mL Mouth Rinse BID  . Chlorhexidine Gluconate Cloth  6 each Topical Daily  . clonazepam  1 mg Oral TID  . darbepoetin (ARANESP) injection - NON-DIALYSIS  100 mcg Subcutaneous Q Sat-1800  . docusate  100 mg Per Tube BID  . feeding supplement (PROSource TF)  45 mL Per Tube TID  . fentaNYL (SUBLIMAZE) injection  50 mcg Intravenous Once  . fiber  1 packet Per Tube BID  . FLUoxetine  40 mg Per Tube BID  . HYDROmorphone  4 mg Per  Tube Q6H  . insulin aspart  0-9 Units Subcutaneous Q4H  . insulin aspart  4 Units Subcutaneous Q4H  . insulin detemir  12 Units Subcutaneous  Q12H  . LORazepam  0.5 mg Intravenous Q4H  . mouth rinse  15 mL Mouth Rinse 10 times per day  . melatonin  5 mg Per Tube Daily  . mexiletine  150 mg Per Tube Q8H  . midodrine  20 mg Per Tube TID WC  . multivitamin  1 tablet Per Tube QHS  . nutrition supplement (JUVEN)  1 packet Per Tube BID BM  . pantoprazole (PROTONIX) IV  40 mg Intravenous Q12H  . polyethylene glycol  17 g Per Tube Daily  . sodium chloride flush  10-40 mL Intracatheter Q12H  . thiamine  100 mg Per Tube Daily  . valproic acid  250 mg Per Tube BID    Infusions: .  prismasol BGK 4/2.5 500 mL/hr at 03/04/20 1805  .  prismasol BGK 4/2.5 200 mL/hr at 03/04/20 7939  . sodium chloride Stopped (03/26/2020 0753)  . sodium chloride    . dexmedetomidine (PRECEDEX) IV infusion 0.3 mcg/kg/hr (03/05/20 0301)  . dextrose Stopped (03/05/2020 1520)  . DOBUTamine 4 mcg/kg/min (03/05/20 0300)  . feeding supplement (PIVOT 1.5 CAL) 1,000 mL (03/04/20 2118)  . fentaNYL infusion INTRAVENOUS 25 mcg/hr (03/05/20 0300)  . heparin 1,800 Units/hr (03/05/20 0300)  . lactated ringers Stopped (02/29/20 0835)  . norepinephrine (LEVOPHED) Adult infusion Stopped (03/04/20 0815)  . prismasol BGK 4/2.5 1,500 mL/hr at 03/04/20 2130    PRN Medications: sodium chloride, Place/Maintain arterial line **AND** sodium chloride, artificial tears, bisacodyl **OR** bisacodyl, dextrose, docusate, fentaNYL, heparin, heparin, lip balm, loperamide HCl, LORazepam, metoprolol tartrate, nystatin, polyethylene glycol, sodium chloride flush     Assessment/Plan   1. Cardiogenic shock/acute on chronic systolic CHF: Initially nonischemic cardiomyopathy.  Possible familial cardiomyopathy as both parents had cardiomyopathy and died at around 47. However, [employer] gene testing did not show any common mutation for cardiomyopathy.   However, this admission noted to have severe 3 vessel disease so suspect component of ischemic cardiomyopathy.  St Jude ICD.  Echo in 8/20 with EF 15% and mildly decreased RV function. Echo this admission with EF < 20%, moderate LV dilation, RV mildly reduced, severe LAE, no significant MR. Low output HF with markedly low EF.  He has a long history of cardiomyopathy (20 yrs), tends to minimize symptoms.  Cardiorenal syndrome with creatinine up to 2.2,  stabilized on milrinone and Impella 5.5. SCr 1.44 day of CABG. CABG 12/15.  Post-op shock, back to OR 12/16 to open chest (chest wall compartment syndrome) and again to evacuate hematoma.  TEE post-op with severe RV dysfunction.  CVVHD initiated 12/19 for volume removal in setting of rising Scr/decreased UOP. Suspect ATN. S/p chest closure 12/23. Hemodynamics much worse on 12/24 with rising pressor demands. Co-ox 36%.  Bedside echo severe biventricular dysfunction with marked septal bounce. Chest re-opened 12/24.  Was atrial paced back to NSR on 12/26 with improvement in hemodynamics but back in atrial fibrillation on 12/27 and unable to pace out, DCCV to NSR on 12/28. Went back to OR 1/5 for chest closure but several hours later required emergent re-opening of chest at bedside to evacuate hematoma. To OR again 1/10 with closure of chest with omental flap with G & J tube.  1/17 partial closure. 1/21 Impella removed.  Off CVVH post-removal, he developed hyperkalemia and wide QRS rhythm requiring transient external pacing, also with suspected septic shock (low SVR, fever, rising WBCs).  Back to OR 2/3 for bleeding at chest wall wound vac.  This morning, he is on dobutamine 4.  Co-ox 74 %  with CVVH running positive. CVP 5 - Continue midodrine 20 TID and  dobutamine  4. - Not candidate to replace Impella (at this point, not candidate for LVAD or transplant).  2. Atrial fibrillation: H/o PAF.  He was on dofetilide in the remote past but this was stopped due to  noncompliance.  He had an upper GI bleed from antral ulcers in 3/12. He was seen by GI and was deemed safe to restart anticoagulation as long as he remains on a PPI.  No apparent recurrence of AF until just prior to this admission, was cardioverted back to NSR in ER but went back into atrial fibrillation.  Post-op afib, DCCV to NSR/BiV pacing am 12/16 -> back to AF. Rapid atrial pacing back to SR. Remains in NSR - Continue amiodarone per tube 200 mg bid.  - c/w heparin gtt at lower dose.   3. CAD:   Cath this admission with severe 3 vessel disease. CABG x 4 on 12/15.  - Continue atorvastatin, ASA.  4. Acute on chronic hypoxemic respiratory failure: Vent per CCM.  Has tracheostomy. Has been getting PSV trials. - Trial trach collar eventually per CCM.  5. AKI on CKD stage 3: Likely ATN/cardiorenal. Anuric. On CVVH.    - Have discussed plan with renal, CVVH through tomorrow pm and will stop.  Attempt iHD Monday.    6. Diabetes: Insulin.  7. Hyponatremia: Resolved with CVVH.  8. Torsades/ICD shock: 12/7/212 in hospital event, in setting of severe hypokalemia and hypomagnesemia.   9. Gout: h/o gout. Complained of rt knee pain c/w previous flares - treated w/ prednisone burst (completed).  10: ID: Septic shock 1/21 with fever, low SVR, and elevated WBCs. Cultures no growth with exception of axillary cx on 1/21 grew staph epidermidis.  IJ catheter removed. Completed hydrocortisone.  - Off abx. 11. FEN: c/w TFs.  12. Anemia: Hgb 8.1 today.  Transfuse today.   13. Stage II Pressure Ulcer- sacrum: Continue to reposition every 2 hours R/L  14. Pleural Effusion: Loculated rt apical pleural effusion noted on imaging. Continue UF through CVVH.  PCCM following  15. VT: Amiodarone + mexiletine.   16. Deconditioning: Profound. Work with PT, nursing to work on range of motion.   Poor prognosis at this point, chest wall is not healing and I do not think he will tolerate iHD. He will remain full code until his  son from the [place] has had time to see him (arrives early next week), then transition to DNR.  Will need comfort care if does not tolerate iHD.     Carlene Coria 03/05/2020 3:27 AM  Patient seen with NP, agree with the above note.   CVVH running even, CVP 5 with co-ox 74% on dobutamine 4.  MAP stable. He is awake and communicating.   General: NAD Neck: Trach. No JVD, no thyromegaly or thyroid nodule.  Lungs: Decreased at bases.  CV: Nondisplaced PMI.  Heart regular S1/S2, no S3/S4, no murmur.  No peripheral edema.   Abdomen: Soft, nontender, no hepatosplenomegaly, no distention.  Skin: Intact without lesions or rashes.  Neurologic: Awake on vent, follows commands.  Extremities: No clubbing or cyanosis.  HEENT: Normal.   Discussed with renal, will stop CVVH in morning tomorrow and plan for iHD on Tuesday.  His family should all be here by then.  If he does not tolerate iHD, aim towards comfort care.  As above, his wife has made him full code again until sons arrive.  CRITICAL CARE Performed by: Loralie Champagne  Total critical care time: 35 minutes  Critical care time was exclusive of separately billable procedures and treating other patients.  Critical care was necessary to treat or prevent imminent or life-threatening deterioration.  Critical care was time spent personally by me on the following activities: development of treatment plan with patient and/or surrogate as well as nursing, discussions with consultants, evaluation of patient's response to treatment, examination of patient, obtaining history from patient or surrogate, ordering and performing treatments and interventions, ordering and review of laboratory studies, ordering and review of radiographic studies, pulse oximetry and re-evaluation of patient's condition.  Loralie Champagne 03/05/2020 8:50 AM

## 2020-03-06 ENCOUNTER — Inpatient Hospital Stay (HOSPITAL_COMMUNITY): Payer: BC Managed Care – PPO

## 2020-03-06 DIAGNOSIS — I639 Cerebral infarction, unspecified: Secondary | ICD-10-CM

## 2020-03-06 DIAGNOSIS — I5021 Acute systolic (congestive) heart failure: Secondary | ICD-10-CM | POA: Diagnosis not present

## 2020-03-06 DIAGNOSIS — J9601 Acute respiratory failure with hypoxia: Secondary | ICD-10-CM | POA: Diagnosis not present

## 2020-03-06 DIAGNOSIS — R570 Cardiogenic shock: Secondary | ICD-10-CM | POA: Diagnosis not present

## 2020-03-06 LAB — BPAM RBC
Blood Product Expiration Date: 202203012359
Blood Product Expiration Date: 202203012359
Blood Product Expiration Date: 202203012359
Blood Product Expiration Date: 202203012359
Blood Product Expiration Date: 202203012359
Blood Product Expiration Date: 202203012359
ISSUE DATE / TIME: 202202031250
ISSUE DATE / TIME: 202202031250
ISSUE DATE / TIME: 202202040922
ISSUE DATE / TIME: 202202050912
Unit Type and Rh: 6200
Unit Type and Rh: 6200
Unit Type and Rh: 6200
Unit Type and Rh: 6200
Unit Type and Rh: 6200
Unit Type and Rh: 6200

## 2020-03-06 LAB — TYPE AND SCREEN
ABO/RH(D): A POS
Antibody Screen: NEGATIVE
Unit division: 0
Unit division: 0
Unit division: 0
Unit division: 0
Unit division: 0
Unit division: 0

## 2020-03-06 LAB — CBC WITH DIFFERENTIAL/PLATELET
Abs Immature Granulocytes: 0.05 10*3/uL (ref 0.00–0.07)
Basophils Absolute: 0 10*3/uL (ref 0.0–0.1)
Basophils Relative: 0 %
Eosinophils Absolute: 0.4 10*3/uL (ref 0.0–0.5)
Eosinophils Relative: 4 %
HCT: 27.8 % — ABNORMAL LOW (ref 39.0–52.0)
Hemoglobin: 8.4 g/dL — ABNORMAL LOW (ref 13.0–17.0)
Immature Granulocytes: 1 %
Lymphocytes Relative: 9 %
Lymphs Abs: 0.9 10*3/uL (ref 0.7–4.0)
MCH: 27.8 pg (ref 26.0–34.0)
MCHC: 30.2 g/dL (ref 30.0–36.0)
MCV: 92.1 fL (ref 80.0–100.0)
Monocytes Absolute: 0.7 10*3/uL (ref 0.1–1.0)
Monocytes Relative: 7 %
Neutro Abs: 7.7 10*3/uL (ref 1.7–7.7)
Neutrophils Relative %: 79 %
Platelets: 205 10*3/uL (ref 150–400)
RBC: 3.02 MIL/uL — ABNORMAL LOW (ref 4.22–5.81)
RDW: 19.4 % — ABNORMAL HIGH (ref 11.5–15.5)
WBC: 9.7 10*3/uL (ref 4.0–10.5)
nRBC: 0 % (ref 0.0–0.2)

## 2020-03-06 LAB — POCT I-STAT 7, (LYTES, BLD GAS, ICA,H+H)
Acid-base deficit: 1 mmol/L (ref 0.0–2.0)
Bicarbonate: 22.5 mmol/L (ref 20.0–28.0)
Calcium, Ion: 1.15 mmol/L (ref 1.15–1.40)
HCT: 28 % — ABNORMAL LOW (ref 39.0–52.0)
Hemoglobin: 9.5 g/dL — ABNORMAL LOW (ref 13.0–17.0)
O2 Saturation: 97 %
Patient temperature: 99.7
Potassium: 5.7 mmol/L — ABNORMAL HIGH (ref 3.5–5.1)
Sodium: 134 mmol/L — ABNORMAL LOW (ref 135–145)
TCO2: 23 mmol/L (ref 22–32)
pCO2 arterial: 32.6 mmHg (ref 32.0–48.0)
pH, Arterial: 7.449 (ref 7.350–7.450)
pO2, Arterial: 86 mmHg (ref 83.0–108.0)

## 2020-03-06 LAB — RENAL FUNCTION PANEL
Albumin: 2 g/dL — ABNORMAL LOW (ref 3.5–5.0)
Albumin: 2.2 g/dL — ABNORMAL LOW (ref 3.5–5.0)
Anion gap: 7 (ref 5–15)
Anion gap: 11 (ref 5–15)
BUN: 43 mg/dL — ABNORMAL HIGH (ref 6–20)
BUN: 59 mg/dL — ABNORMAL HIGH (ref 6–20)
CO2: 25 mmol/L (ref 22–32)
CO2: 22 mmol/L (ref 22–32)
Calcium: 8.2 mg/dL — ABNORMAL LOW (ref 8.9–10.3)
Calcium: 8.5 mg/dL — ABNORMAL LOW (ref 8.9–10.3)
Chloride: 102 mmol/L (ref 98–111)
Chloride: 100 mmol/L (ref 98–111)
Creatinine, Ser: 1 mg/dL (ref 0.61–1.24)
Creatinine, Ser: 1.24 mg/dL (ref 0.61–1.24)
GFR, Estimated: >60 mL/min (ref >60)
GFR, Estimated: >60 mL/min (ref >60)
Glucose, Bld: 198 mg/dL — ABNORMAL HIGH (ref 70–99)
Glucose, Bld: 115 mg/dL — ABNORMAL HIGH (ref 70–99)
Phosphorus: 1.8 mg/dL — ABNORMAL LOW (ref 2.5–4.6)
Phosphorus: 1.8 mg/dL — ABNORMAL LOW (ref 2.5–4.6)
Potassium: 4.6 mmol/L (ref 3.5–5.1)
Potassium: 5.2 mmol/L — ABNORMAL HIGH (ref 3.5–5.1)
Sodium: 134 mmol/L — ABNORMAL LOW (ref 135–145)
Sodium: 133 mmol/L — ABNORMAL LOW (ref 135–145)

## 2020-03-06 LAB — HEPARIN LEVEL (UNFRACTIONATED)
Heparin Unfractionated: 0.44 IU/mL (ref 0.30–0.70)
Heparin Unfractionated: 0.3 IU/mL (ref 0.30–0.70)
Heparin Unfractionated: 0.11 IU/mL — ABNORMAL LOW (ref 0.30–0.70)

## 2020-03-06 LAB — POTASSIUM: Potassium: 5.7 mmol/L — ABNORMAL HIGH (ref 3.5–5.1)

## 2020-03-06 LAB — GLUCOSE, CAPILLARY
Glucose-Capillary: 172 mg/dL — ABNORMAL HIGH (ref 70–99)
Glucose-Capillary: 194 mg/dL — ABNORMAL HIGH (ref 70–99)
Glucose-Capillary: 156 mg/dL — ABNORMAL HIGH (ref 70–99)
Glucose-Capillary: 182 mg/dL — ABNORMAL HIGH (ref 70–99)
Glucose-Capillary: 155 mg/dL — ABNORMAL HIGH (ref 70–99)
Glucose-Capillary: 202 mg/dL — ABNORMAL HIGH (ref 70–99)
Glucose-Capillary: 275 mg/dL — ABNORMAL HIGH (ref 70–99)
Glucose-Capillary: 291 mg/dL — ABNORMAL HIGH (ref 70–99)

## 2020-03-06 LAB — COOXEMETRY PANEL
Carboxyhemoglobin: 1.8 % — ABNORMAL HIGH (ref 0.5–1.5)
Methemoglobin: 0.8 % (ref 0.0–1.5)
O2 Saturation: 60.8 %
Total hemoglobin: 12.7 g/dL (ref 12.0–16.0)

## 2020-03-06 LAB — ECHOCARDIOGRAM COMPLETE
Height: 75 in
MV M vel: 4.09 m/s
MV Peak grad: 66.9 mmHg
S' Lateral: 5.5 cm
Weight: 2857.16 oz

## 2020-03-06 LAB — MAGNESIUM: Magnesium: 2.5 mg/dL — ABNORMAL HIGH (ref 1.7–2.4)

## 2020-03-06 MED ORDER — NOREPINEPHRINE 4 MG/250ML-% IV SOLN
0.0000 ug/min | INTRAVENOUS | Status: DC
  Administered 2020-03-06: 6 ug/min via INTRAVENOUS
  Administered 2020-03-06: 25 ug/min via INTRAVENOUS
  Administered 2020-03-07 (×2): 19 ug/min via INTRAVENOUS
  Administered 2020-03-07: 24 ug/min via INTRAVENOUS
  Administered 2020-03-07 (×3): 19 ug/min via INTRAVENOUS
  Administered 2020-03-07: 24 ug/min via INTRAVENOUS
  Administered 2020-03-08 (×2): 19 ug/min via INTRAVENOUS
  Filled 2020-03-06 (×11): qty 250

## 2020-03-06 MED ORDER — SODIUM ZIRCONIUM CYCLOSILICATE 5 G PO PACK
5.0000 g | PACK | Freq: Once | ORAL | Status: AC
  Administered 2020-03-06: 5 g
  Filled 2020-03-06: qty 1

## 2020-03-06 MED ORDER — CHLORHEXIDINE GLUCONATE 0.12 % MT SOLN
OROMUCOSAL | Status: AC
  Filled 2020-03-06: qty 15

## 2020-03-06 MED ORDER — HYDROMORPHONE HCL 1 MG/ML IJ SOLN
1.0000 mg | INTRAMUSCULAR | Status: DC | PRN
  Administered 2020-03-06: 1 mg via INTRAVENOUS
  Filled 2020-03-06: qty 1

## 2020-03-06 MED ORDER — PERFLUTREN LIPID MICROSPHERE
1.0000 mL | INTRAVENOUS | Status: AC | PRN
  Administered 2020-03-06: 2 mL via INTRAVENOUS
  Filled 2020-03-06: qty 10

## 2020-03-06 MED ORDER — CHLORHEXIDINE GLUCONATE CLOTH 2 % EX PADS: 6.0000 | MEDICATED_PAD | Freq: Every day | CUTANEOUS | Status: DC

## 2020-03-06 MED ORDER — HEPARIN (PORCINE) 25000 UT/250ML-% IV SOLN
1400.0000 [IU]/h | INTRAVENOUS | Status: DC
  Administered 2020-03-06: 18:00:00 1200 [IU]/h via INTRAVENOUS
  Administered 2020-03-07 – 2020-03-08 (×2): 1400 [IU]/h via INTRAVENOUS
  Filled 2020-03-06 (×4): qty 250

## 2020-03-06 MED ORDER — FENTANYL CITRATE (PF) 100 MCG/2ML IJ SOLN: 25.0000 ug | INTRAMUSCULAR | Status: DC | PRN

## 2020-03-06 MED ORDER — FENTANYL 2500MCG IN NS 250ML (10MCG/ML) PREMIX INFUSION
0.0000 ug/h | INTRAVENOUS | Status: DC
  Administered 2020-03-06: 25 ug/h via INTRAVENOUS
  Administered 2020-03-08: 75 ug/h via INTRAVENOUS
  Filled 2020-03-06 (×2): qty 250

## 2020-03-06 NOTE — Progress Notes (Signed)
NAME:  Corey Palmer, MRN:  [id number], DOB:  11-Feb-1970, LOS: 92 ADMISSION DATE:  01/07/2020, CONSULTATION DATE: 01/16/2020 REFERRING MD: Aundra Dubin CHIEF COMPLAINT: Respiratory failure post Impella  HPI/Course in hospital  50 year old man admitted 12/1 with A. fib/RVR, torsades, cardiogenic shock [EF 15] requiring Impella placement (removed 1/21), CABG x 4 c/b cardiac tamponade requiring chest reopening (now with VAC), AKI requiring on CRRT.  Past Medical History:    has a past medical history of Anginal pain ([place]), Anxiety, Arthritis, Automatic implantable cardioverter-defibrillator in situ (2007), Bipolar disorder ([place]), CAD (coronary artery disease), Cancer ([place]) (2013), CHF (congestive heart failure) ([place]), Chondrocalcinosis of right knee (08/09/4578), Chronic systolic heart failure ([place]), Depression, Diabetes uncomplicated adult-type II, Dilated cardiomyopathy ([place]), Dyslipidemia, Dysrhythmia, GERD (gastroesophageal reflux disease), Gout, unspecified, Hepatic steatosis (2011), History of kidney stones, echocardiogram, Hypertension, Obesity (BMI 30.0-34.9), Pacemaker, Paroxysmal atrial fibrillation ([place]), Peptic ulcer, Shortness of breath, and Sleep apnea.  Significant Hospital Events:  12/2 Afib with RVR. Cardioverted in ED. AICD did not fire. Milrinone for low co ox.  12/7 ICD fired, Torsades- likely from low K of 2.8.  12/8 Cath- multivessel disease w/ low CO. Volume overloaded.  12/11 Underwent Impella 5.5 insertion for persistent symptoms of forward failure with low cardiac indices. 12/12 Back to the OR for displacement of Impella. Extubated.  12/15 CABG x4, with LIMA-LAD, SVG-PDA/PLV, SVG-OM 12/16 Emergently opened chest at bedside for tamponade, clot compressing right atrium  12/16 To OR for Exploration of chest due to bleeding  12/19 Started CRRT 12/23 Sternal Closure in OR 12/24 Worsening shock.  Chest reopened at bedside with improvement in hemodynamics.  No mediastinal  hematoma. 12/25 A flutter > rapid a paced to sinus rhythm.  Remains on high-dose pressors, inotropes 12/28 Weaning vasopressor requirements.  For chest washout today.  Back in atrial fibrillation, refractory to increased amiodarone and attempted overdrive pacing.  DCCV to NSR. Tolerating fluid removal via CRRT. 1/03 Appears euvolemic.  Adjusting glycemic control.  Chest still open so not weaning 1/04 Spiking fever, Vanco meropenem resumed after culture sent from blood and sputum. 1/05 Awaiting return to the OR for wound VAC dressing assessment fever curve and white blood cell count improving after antibiotics resumed the day prior, did have chest closed, developed tamponade physiology shortly after had to go back for emergent reopening of chest, return to intensive care and refractory shock 1/06 Wound VAC dressing replaced.  Still in shock.  Starting to wean sedation 1/07 Still on high-dose pressors, CRRT 1/15 Recurrent Vtach, shocked 1/18 Attempted pressure change to P 3 with addition of Dobutamine . MAP goal > 60 1/19 Ongoing attempts at impella wean, on PS 1/20 Stable on vent, following commands with sedation weaned, continued Impella wean attempt 1/21 Impella removed, on FloTrac, WBC 22K, pancultured, Cefepime/Vanc started empirically for low SVR, concern for sepsis 1/22 antifungals added Impella removed on 1/21, 3 PM CT x3 in place 1/27 OR with plastics for debridement, ACell, partial closure 1/28 plan to stop CRRT when clots off 1/29 CRRT didn't clot off, continues on 1/31 CRRT ongoing, tolerated PSV 16/8 for several hours.  1/31 onward: vent wean, another washout, now [place] discussions  Consults:  PCCM, nephrology, cardiology, cardiothoracic surgery, EP  Procedures:  R PICC 12/2 >> R axillary Impella 5.5 12/11 >> 1/21 ETT 12/15 >> 1/10 R femoral arterial sheath 12/15 >> 12/31 R femoral HD cath 12/19 >> 12/31 Chest tube - right pleural, left pleural , mediastinal 12/23 >> 1/5 R  Radial A-Line 1/6 >>  R fem HD > Chest tube - L pleural, R pleural, mediastinal 1/10 >> 1/30 Trach 1/10 > PEG 1/10 > 03/16/2020 partial closure of sternal wound with placement of wound VAC plastic surgery Significant Diagnostic Tests:  12/8 LHC  Elevated R and L heart filling pressures with low CO, severe 3-vessel disease 1/16 echo > Severe biventricular failure. No significant effusion. Pacing wire in  RV. Markedly abnormal septal motion. Micro Data:  1/19 BCx2 -no growth at 3 days 1/21 OR Cx > staph epi  Antimicrobials:  Levaquin 1/21 (periop ppx) Meropenem 1/21>1/29 Vanc 1/21 > off Anidulafungin 1/22>  1/29  Interval history:   Awake and follows commands weakly.  Tolerating weaning today.   Objective   Blood pressure 91/67, pulse 84, temperature (!) 97.1 F (36.2 C), resp. rate (!) 28, height 6\' 3"  (1.905 m), weight 81 kg, SpO2 100 %. CVP:  [3 mmHg-15 mmHg] 15 mmHg  Vent Mode: PSV;CPAP FiO2 (%):  [30 %] 30 % Set Rate:  [24 bmp] 24 bmp Vt Set:  [510 mL] 510 mL PEEP:  [5 cmH20] 5 cmH20 Pressure Support:  [8 cmH20] 8 cmH20 Plateau Pressure:  [15 cmH20-20 cmH20] 15 cmH20   Intake/Output Summary (Last 24 hours) at 03/06/2020 0830 Last data filed at 03/06/2020 0700 Gross per 24 hour  Intake 3413.36 ml  Output 3966 ml  Net -552.64 ml   Filed Weights   03/04/20 0358 03/05/20 0500 03/06/20 0600  Weight: 81.1 kg 81.4 kg 81 kg   CVP:  [3 mmHg-15 mmHg] 15 mmHg   Examination:   Constitutional: no acute distress, chronically ill appearing  Eyes: EOMI, pupils equal Ears, nose, mouth, and throat: MM dry, trach in place, with some minor pressure related injury at 6 oclock Cardiovascular: RRR, ext warm. Wound VAC in place  Respiratory: scattered crackles, passive on vent Gastrointestinal: soft, +BS. G-tube to drainage, J-tube in place. Midline incision well healed, some induration but no erythema.  Skin: No rashes, normal turgor Neurologic: moves all 4 ext, profoundly  weak Psychiatric: RASS 0  POC echocardiogram performed and reviewed with Dr Marigene Ehlers. LV dilated but function improved to EF closer to 30-35% normal RV function with minimal MR, TR.    Assessment & Plan:   Acute hypoxic respiratory failure requiring MV, s/p trach 1/10 Possible HCAP s/p tx with meropenem/eraxis Small R pleural effusion, persistent  Open sternal wound with partial closure 03/18/2020 03/2020 increased in drainage from sternal wound site with increasing size of suspected hematoma under wound- Taken to the OR 03/09/2020 per CVTS Stabilized return to unit transfused x2 units  Cardiogenic shock-s/p impella, removed 1/21, s/p CABG c/b tamponade and multiple chest re-openings, prolonged open chest , s/p excision/debridement + ACell application, partial closure with plastics 1/27 MRSE sternal wound infection Afib RVR, recurrent VT, Torsades -- improved  Acute renal failure in CKD IIIa requiring RRT Cardiorenal syndrome   - SBT today and proceed to trach collar trials for 2h stretches. Would leave on ventilator for iHD trial.  - Stop all iv continuous sedation.  - Wean dobutamine as tolerated.  - Tentative plans for iHD trial 2/8, if fails, brother is coming in from [place];  Tuesday to discuss [place] with wife and patient  Daily Goals Checklist  Pain/Anxiety/Delirium protocol (if indicated): stop Precedex. Switch to intermittent hydromorphone VAP protocol (if indicated): yes DVT prophylaxis:  Heparin gtt Nutrition Status: TF GI prophylaxis: Pantoprazole Glucose control: Insulin (Basal, Standing, SSI) Mobility/therapy needs: progressive mobilization as tolerated.  Code Status: Full  Family  Communication: per primary-- palliative also updating  Disposition: ICU   CRITICAL CARE Performed by: Kipp Brood   Total critical care time: 40 minutes  Critical care time was exclusive of separately billable procedures and treating other patients.  Critical care was necessary to treat  or prevent imminent or life-threatening deterioration.  Critical care was time spent personally by me on the following activities: development of treatment plan with patient and/or surrogate as well as nursing, discussions with consultants, evaluation of patient's response to treatment, examination of patient, obtaining history from patient or surrogate, ordering and performing treatments and interventions, ordering and review of laboratory studies, ordering and review of radiographic studies, pulse oximetry, re-evaluation of patient's condition and participation in multidisciplinary rounds.  Kipp Brood, MD [facility] ICU Physician [place]  Pager: [phone] Mobile: [phone] After hours: [phone].

## 2020-03-06 NOTE — Progress Notes (Signed)
Nephologist on rounds, ok per nephrology to discontinue CRRT.

## 2020-03-06 NOTE — Progress Notes (Signed)
[facility] NEPHROLOGY PROGRESS NOTE  Assessment/ Plan:  # Acute kidney Injury on chronic kidney disease stage III: on CRRT since 01/16/2020.  Etiology suspected to be ischemic ATN in the setting of cardiogenic shock status post CABG. Anuric and on CRRT for renal replacement therapy at this time.  As per previous plan, we will discontinue CRRT today and attempt intermittent hemodialysis tomorrow. This is around the time when his son will be coming to see him from out of town.  If unable to tolerate IHD, will transition to comfort care.  # Coronary artery disease status post CABG: Complicated by cardiogenic shock postoperative hematoma requiring reexploration and secondary closure.  He has required reexploration of the sternal wound for recurrent bleeding, had debridement/application of wound VAC.  # Cardiogenic shock with ejection fraction of about 20%: Status post discontinuation of Impella 1/21.  Remains on high-dose midodrine for blood pressure support.  On fentanyl drip. # Acute blood loss anemia: With recurrent bleeding from sternal wound noted, transfusion triggers per TCTS.  #Ventilator dependent respiratory failure: Status post tracheostomy on 1/10.  Enteral nutrition via PEG tube.  Subjective: Seen and examined ICU.  Tolerating CRRT well with borderline low blood pressure.  Is alert and following simple commands.  On trach.  Receiving fentanyl, dobutamine. Objective Vital signs in last 24 hours: Vitals:   03/06/20 0400 03/06/20 0500 03/06/20 0600 03/06/20 0700  BP: 1'02/67 91/64 96/64 ' 91/67  Pulse: 78 81 80 84  Resp: (!) 27 (!) 29 (!) 22 (!) 28  Temp:      TempSrc:      SpO2: 100% 100% 100% 100%  Weight:   81 kg   Height:       Weight change: -0.4 kg  Intake/Output Summary (Last 24 hours) at 03/06/2020 0745 Last data filed at 03/06/2020 0700 Gross per 24 hour  Intake 3519.87 ml  Output 4062 ml  Net -542.13 ml       Labs: Basic Metabolic Panel: Recent  Labs  Lab 03/05/20 0357 03/05/20 2001 03/06/20 0455  NA 134* 134* 134*  K 4.3 4.9 4.6  CL 102 101 102  CO2 '25 26 25  ' GLUCOSE 208* 194* 198*  BUN 42* 48* 43*  CREATININE 0.96 0.99 1.00  CALCIUM 8.2* 8.1* 8.2*  PHOS 1.9* 1.5* 1.8*   Liver Function Tests: Recent Labs  Lab 03/05/20 0357 03/05/20 2001 03/06/20 0455  ALBUMIN 2.0* 2.1* 2.0*   No results for input(s): LIPASE, AMYLASE in the last 168 hours. No results for input(s): AMMONIA in the last 168 hours. CBC: Recent Labs  Lab 03/03/20 0408 03/03/20 1345 03/04/20 0412 03/04/20 1354 03/05/20 0357 03/06/20 0455  WBC 17.8*  --  13.2* 13.5* 9.9 9.7  NEUTROABS 14.3*  --  10.7*  --  8.0* 7.7  HGB 7.9*   < > 7.3* 8.2* 8.1* 8.4*  HCT 25.6*   < > 24.7* 27.1* 28.0* 27.8*  MCV 90.1  --  90.1 90.6 94.3 92.1  PLT 412*  --  234 218 203 205   < > = values in this interval not displayed.   Cardiac Enzymes: No results for input(s): CKTOTAL, CKMB, CKMBINDEX, TROPONINI in the last 168 hours. CBG: Recent Labs  Lab 03/05/20 1147 03/05/20 1557 03/05/20 2004 03/06/20 0030 03/06/20 0430  GLUCAP 196* 156* 170* 172* 194*    Iron Studies: No results for input(s): IRON, TIBC, TRANSFERRIN, FERRITIN in the last 72 hours. Studies/Results: DG Abd 1 View  Result Date: 03/06/2020 CLINICAL DATA:  50 year old  male with history of atrial fibrillation, RVR, tore sides, cardiogenic shock, CABG. Peg tube placed last month. EXAM: ABDOMEN - 1 VIEW COMPARISON:  Portable abdomen 02/23/2020 and earlier. FINDINGS: Portable AP supine view at 0325 hours. Partially visible cardiac AICD. Visible lung bases appear negative. There are 2 left side percutaneous enteric tubes, the lower tube appears to be a J-tube based on CT Abdomen and Pelvis 02/12/2020. Stable cholecystectomy clips. Visualized bowel gas pattern is non obstructed. No acute osseous abnormality identified. IMPRESSION: G-tube and J-tube in place with non obstructed bowel gas pattern. Electronically  Signed   By: Genevie Ann M.D.   On: 03/06/2020 04:40    Medications: Infusions: .  prismasol BGK 4/2.5 500 mL/hr at 03/05/20 1429  .  prismasol BGK 4/2.5 200 mL/hr at 03/06/20 0048  . sodium chloride Stopped (03/06/2020 0753)  . sodium chloride    . dexmedetomidine (PRECEDEX) IV infusion 0.3 mcg/kg/hr (03/06/20 0700)  . dextrose Stopped (03/01/20 1520)  . DOBUTamine 4 mcg/kg/min (03/06/20 0700)  . feeding supplement (PIVOT 1.5 CAL) 75 mL/hr at 03/06/20 0400  . fentaNYL infusion INTRAVENOUS 25 mcg/hr (03/06/20 0700)  . heparin 1,800 Units/hr (03/06/20 0700)  . lactated ringers Stopped (02/29/20 0835)  . norepinephrine (LEVOPHED) Adult infusion Stopped (03/04/20 0815)  . prismasol BGK 4/2.5 1,500 mL/hr at 03/06/20 3500    Scheduled Medications: . amiodarone  200 mg Per Tube BID  . vitamin C  500 mg Per Tube TID  . aspirin  324 mg Per Tube Daily  . atorvastatin  80 mg Per Tube Daily  . busPIRone  10 mg Per Tube TID  . chlorhexidine gluconate (MEDLINE KIT)  15 mL Mouth Rinse BID  . Chlorhexidine Gluconate Cloth  6 each Topical Daily  . clonazepam  1 mg Oral TID  . darbepoetin (ARANESP) injection - NON-DIALYSIS  100 mcg Subcutaneous Q Sat-1800  . docusate  100 mg Per Tube BID  . feeding supplement (PROSource TF)  45 mL Per Tube TID  . fentaNYL (SUBLIMAZE) injection  50 mcg Intravenous Once  . fiber  1 packet Per Tube BID  . FLUoxetine  40 mg Per Tube BID  . HYDROmorphone  4 mg Per Tube Q6H  . insulin aspart  0-9 Units Subcutaneous Q4H  . insulin aspart  4 Units Subcutaneous Q4H  . insulin detemir  12 Units Subcutaneous Q12H  . LORazepam  0.5 mg Intravenous Q4H  . mouth rinse  15 mL Mouth Rinse 10 times per day  . melatonin  5 mg Per Tube Daily  . mexiletine  150 mg Per Tube Q8H  . midodrine  20 mg Per Tube TID WC  . multivitamin  1 tablet Per Tube QHS  . nutrition supplement (JUVEN)  1 packet Per Tube BID BM  . pantoprazole (PROTONIX) IV  40 mg Intravenous Q12H  . polyethylene  glycol  17 g Per Tube Daily  . sodium chloride flush  10-40 mL Intracatheter Q12H  . thiamine  100 mg Per Tube Daily  . valproic acid  250 mg Per Tube BID    have reviewed scheduled and prn medications.  Physical Exam: General:NAD, comfortable, on ventilator via tracheostomy opening eyes and following simple commands. Heart:RRR, s1s2 nl Lungs: Anteriorly clear. Abdomen:soft, Non-tender, non-distended Extremities:No edema Dialysis Access: Right femoral dialysis HD catheter.  Corey Palmer Corey Palmer 03/06/2020,7:45 AM  LOS: 67 days

## 2020-03-06 NOTE — Progress Notes (Signed)
[place] for Heparin Indication: Afib  Allergies  Allergen Reactions  . Orange Fruit Anaphylaxis, Hives and Other (See Comments)    Per Pt- Blisters around lips and Hives all over, also  . Penicillins Anaphylaxis    Did it involve swelling of the face/tongue/throat, SOB, or low BP? Yes Did it involve sudden or severe rash/hives, skin peeling, or any reaction on the inside of your mouth or nose? Yes Did you need to seek medical attention at a hospital or doctor's office? No When did it last happen?childhood If all above answers are "NO", may proceed with cephalosporin use.  [name] [Empagliflozin] Itching  . Basaglar Kwikpen [Insulin Glargine] Nausea And Vomiting    Patient Measurements: Height: 6\' 3"  (190.5 cm) Weight: 81 kg (178 lb 9.2 oz) IBW/kg (Calculated) : 84.5 Heparin dosing wt: 101 kg  Vital Signs: Temp: 98.9 F (37.2 C) (02/07 1530) Temp Source: Oral (02/07 1530) BP: 98/68 (02/07 1100) Pulse Rate: 94 (02/07 1130)  Labs: Recent Labs    03/04/20 1354 03/04/20 1542 03/05/20 0357 03/05/20 2001 03/06/20 0455 03/06/20 1437  HGB 8.2*  --  8.1*  --  8.4*  --   HCT 27.1*  --  28.0*  --  27.8*  --   PLT 218  --  203  --  205  --   HEPARINUNFRC  --    < > 0.27*  --  0.44 0.30  CREATININE  --    < > 0.96 0.99 1.00 1.24   < > = values in this interval not displayed.    Estimated Creatinine Clearance: 80.7 mL/min (by C-G formula based on SCr of 1.24 mg/dL).   Assessment: 33 yoM with hx PAF on apixaban PTA. Pt admitted with shock and required Impella 5.5 support. CABG 12/15 with chest left open, c/b hematoma and significant bleeding. Ultimately heparin started via purge for Impella. DCCV performed 12/16, partial chest closure 1/5, Impella removed 1/21. Heparin resumed for AFib 1/22 and stopped 2/3 due to bleeding from wound vac. Low dose heparin restarted 2/5.  Heparin level today came back at 0.3, on 1600 units/hr. CRRT  stopped. Acute CVA this afternoon. Discussed with neuro and at this time the risk of further cardioembolic events outweighs risk for hemorrhagic transformation.   Given above events and the fact that CRRT is off will restart heparin very cautiously at much lower rate and follow closely for any signs or symptoms transformation or bleeding.   Goal of Therapy:  Heparin level ~0.3  Monitor platelets by anticoagulation protocol: Yes   Plan:  -Restart heparin at 1200 units/hr with expectation that level will be low tonight, will check level in 6 hours but no plans to titrate heparin any further until tomorrow morning -Daily heparin level and CBC, s/sx of bleeding   Erin Hearing PharmD., BCPS Clinical Pharmacist 03/06/2020 4:22 PM

## 2020-03-06 NOTE — Progress Notes (Addendum)
[facility] for Heparin Indication: Afib  Allergies  Allergen Reactions  . Orange Fruit Anaphylaxis, Hives and Other (See Comments)    Per Pt- Blisters around lips and Hives all over, also  . Penicillins Anaphylaxis    Did it involve swelling of the face/tongue/throat, SOB, or low BP? Yes Did it involve sudden or severe rash/hives, skin peeling, or any reaction on the inside of your mouth or nose? Yes Did you need to seek medical attention at a hospital or doctor's office? No When did it last happen?childhood If all above answers are "NO", may proceed with cephalosporin use.  [name] [Empagliflozin] Itching  . Basaglar Kwikpen [Insulin Glargine] Nausea And Vomiting    Patient Measurements: Height: 6\' 3"  (190.5 cm) Weight: 81 kg (178 lb 9.2 oz) IBW/kg (Calculated) : 84.5 Heparin dosing wt: 101 kg  Vital Signs: Temp: 97.7 F (36.5 C) (02/07 0341) Temp Source: Axillary (02/07 0341) BP: 91/64 (02/07 0500) Pulse Rate: 81 (02/07 0500)  Labs: Recent Labs    03/04/20 1354 03/04/20 1542 03/04/20 1739 03/05/20 0357 03/05/20 2001 03/06/20 0455  HGB 8.2*  --   --  8.1*  --  8.4*  HCT 27.1*  --   --  28.0*  --  27.8*  PLT 218  --   --  203  --  205  HEPARINUNFRC  --   --  0.25* 0.27*  --  0.44  CREATININE  --    < >  --  0.96 0.99 1.00   < > = values in this interval not displayed.    Estimated Creatinine Clearance: 100.1 mL/min (by C-G formula based on SCr of 1 mg/dL).   Assessment: 40 yoM with hx PAF on apixaban PTA. Pt admitted with shock and required Impella 5.5 support. CABG 12/15 with chest left open, c/b hematoma and significant bleeding. Ultimately heparin started via purge for Impella. DCCV performed 12/16, partial chest closure 1/5, Impella removed 1/21. Heparin resumed for AFib 1/22 and stopped 2/3 due to bleeding from wound vac. Low dose heparin restarted 2/5.  Heparin level today came back at 0.44, on 1800 units/hr. Hgb  8.4, plt 205. No s/sx of bleeding or infusion issues. Plan to stop CRRT and attempt iHD.   Goal of Therapy:  Heparin level ~0.3  Monitor platelets by anticoagulation protocol: Yes   Plan:  -Reduce heparin level down to 1600 units/hr with HL above goal and CRRT stopping -Order heparin level in 8 hours  -Daily heparin level and CBC, s/sx of bleeding   [name], PharmD, [place] Pharmacist  Phone: [phone] 03/06/2020 7:34 AM  Please check AMION for all [place] phone numbers After 10:00 PM, call [place] [phone]

## 2020-03-06 NOTE — Progress Notes (Signed)
4 Days Post-Op Procedure(s) (LRB): STERNAL WOUND DEBRIDEMENT (Bilateral) WOUND EXPLORATION (Bilateral) APPLICATION OF WOUND VAC (Bilateral) Subjective: Not communicative  Objective: Vital signs in last 24 hours: Temp:  [97.1 F (36.2 C)-98.9 F (37.2 C)] 98.9 F (37.2 C) (02/07 1530) Pulse Rate:  [77-94] 94 (02/07 1130) Cardiac Rhythm: Normal sinus rhythm (02/07 0800) Resp:  [12-36] 26 (02/07 1526) BP: (90-119)/(48-73) 98/68 (02/07 1100) SpO2:  [98 %-100 %] 98 % (02/07 1603) Arterial Line BP: (95-124)/(44-57) 124/56 (02/07 1130) FiO2 (%):  [30 %-35 %] 30 % (02/07 1603) Weight:  [81 kg] 81 kg (02/07 0600)  Hemodynamic parameters for last 24 hours: CVP:  [3 mmHg-16 mmHg] 12 mmHg  Intake/Output from previous day: 02/06 0701 - 02/07 0700 In: 3519.9 [I.V.:809.9; NG/GT:1800] Out: 4062 [Drains:455; Stool:400] Intake/Output this shift: Total I/O In: 310.7 [I.V.:85.7; NG/GT:225] Out: 306 [Other:306]  General appearance: slowed mentation Neurologic: right sided weakness Heart: regular rate and rhythm, S1, S2 normal, no murmur, click, rub or gallop Lungs: clear to auscultation bilaterally Abdomen: soft, non-tender; bowel sounds normal; no masses,  no organomegaly Extremities: edema minimal Wound: wound vac intact  Lab Results: Recent Labs    03/05/20 0357 03/06/20 0455  WBC 9.9 9.7  HGB 8.1* 8.4*  HCT 28.0* 27.8*  PLT 203 205   BMET:  Recent Labs    03/06/20 0455 03/06/20 1437  NA 134* 133*  K 4.6 5.2*  CL 102 100  CO2 25 22  GLUCOSE 198* 115*  BUN 43* 59*  CREATININE 1.00 1.24  CALCIUM 8.2* 8.5*    PT/INR: No results for input(s): LABPROT, INR in the last 72 hours. ABG    Component Value Date/Time   PHART 7.370 03/23/2020 1259   HCO3 27.3 03/22/2020 1259   TCO2 29 03/09/2020 1259   ACIDBASEDEF 1.0 02/21/2020 0625   O2SAT 60.8 03/06/2020 0443   CBG (last 3)  Recent Labs    03/06/20 0752 03/06/20 1140 03/06/20 1543  GLUCAP 156* 182* 155*     Assessment/Plan: S/P Procedure(s) (LRB): STERNAL WOUND DEBRIDEMENT (Bilateral) WOUND EXPLORATION (Bilateral) APPLICATION OF WOUND VAC (Bilateral) acute left cerebral CVA-unclear etiol unclear implications Intermittent HD Trach collar trials   LOS: 67 days    [name] 03/06/2020

## 2020-03-06 NOTE — Consult Note (Addendum)
Referring Physician: Larey Dresser, MD    Chief Complaint: Right sided weakness  HPI: Corey Palmer is an 50 y.o. male PMHx of CAD,  automatic implantable cardioverter-defibrillator in situ (2007), congestive heart failure, anginal pain, anxiety, arthritis, bipolar disorder, cancer, chondrocalcinosis of right knee, depression, diabetes mellitus type 2, dilated cardiomyopathy, dyslipidemia, dysrhythmia, GERD, gout, hepatic steatosis, kidney stones, hypertension, obesity, paroxysmal atrial fibrillation, peptic ulcer and obstructive sleep apnea.  He was admitted on 12/28/2020 with A. fib/RVR, torsades, cardiogenic shock (EF 15) requiring Impella placement (removed 1/21), CABG*4 c/b cardiac tamponade requiring chest reopening now with VAC, AKI requiring CRRT.  Today on March 06, 2020 he was  noticed to have decreased movement on right side of body.  History is obtained from wife present in the room, RN on duty and physician.  Patient is globally aphasic and unable to contribute to history. 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  She states he was able to walk well before this admission. This morning patient was unable to move his right side except some flickering in right hand,as per RN. LKN per RN was 0800. CT head revealed a large subacute left sided  posterior temporal and medial occipital lobe ischemic CVA. Patient has recently been on IV heparin for PAF, but has been on and off with surgeries and bleeding. Heart Failure Team suspects CVA is related to AF/LA thrombus.    LSN: 8 AM, as per RN tPA Given: No: Out of time window, extensive completed stroke, bleeding complications and major surgery less than 3 weeks ago   NIHSS: 1A: Level of Consciousness: 0 1B: Ask questions: +2 1c: 'Blink eyes & Squeeze hand': +1 2. Horizontal extraocular movements: +2 3. Visual fields: +2 4. Facial palsy: +1 5A. Left arm motor  drift: +2 5B. Right arm motor drift: +4 6A: left leg motor drift: +4 6B: Right leg motor drift; +4 7. Limb Ataxia: 0 8. Sensation:0 9. Language/aphasia: +3 10. Dysarthria: +2 11. Extinction/inattention: +1  Total: 28    Past Medical History:  Diagnosis Date  . Anginal pain ([place])   . Anxiety   . Arthritis   . Automatic implantable cardioverter-defibrillator in situ 2007   [employer]   . Bipolar disorder ([place])   . CAD (coronary artery disease)    Cardiac cath (08/19/2000) - Nonobstructive, 40% stenosis of LAD,.   . Cancer [facility]) 2013   benign stomach cancer  . CHF (congestive heart failure) ([place])   . Chondrocalcinosis of right knee 06/11/2013  . Chronic systolic heart failure ([place])    2D echo (78/2423) - Systolic function severely reduced., LV EF 20-25%. Akinesis of apical and anteroseptal mycoardium.  last 2D echo (11/2008 ), LV EF 25-30%, diffuse hypokinesis, and grade 1 diastolic dysfunction.  . Depression   . Diabetes uncomplicated adult-type II    on insulin therapy, a1c 12.9 in 01/2011  . Dilated cardiomyopathy ([place])    status post ICD placement in 2008 c/b tearing at the atrial junction resulting in tamponade and urgent thoracotomy  . Dyslipidemia   . Dysrhythmia   . GERD (gastroesophageal reflux disease)   . Gout, unspecified   . Hepatic steatosis 2011   seen on ultrasound.   . History of kidney stones   . Hx of echocardiogram    Echo (04/2013):  Mild LVH, EF 20-25%, mild LAE, mild to mod RVE with mild reduced RVSF.  Marland Kitchen Hypertension   .  Obesity (BMI 30.0-34.9)   . Pacemaker   . Paroxysmal atrial fibrillation ([facility])    on chronic coumadin, goal INR 2-3. status post direct current  . Peptic ulcer   . Shortness of breath   . Sleep apnea    does not have CPAP    Past Surgical History:  Procedure Laterality Date  . APPLICATION OF A-CELL OF EXTREMITY N/A 02/10/2020   Procedure: APPLICATION OF A-CELL;  Surgeon: Wallace Going, DO;  Location: [place];  Service:  Plastics;  Laterality: N/A;  . APPLICATION OF A-CELL OF EXTREMITY N/A 02/09/2020   Procedure: APPLICATION OF A-CELL;  Surgeon: Wallace Going, DO;  Location: [place];  Service: Plastics;  Laterality: N/A;  . APPLICATION OF A-CELL OF EXTREMITY N/A 02/29/2020   Procedure: APPLICATION OF A-CELL OR MATRIDERM;  Surgeon: Wallace Going, DO;  Location: [place];  Service: Plastics;  Laterality: N/A;  . APPLICATION OF WOUND VAC N/A 01/17/2020   Procedure: APPLICATION OF WOUND VAC;  Surgeon: Prescott Gum, Collier Salina, MD;  Location: [place];  Service: Thoracic;  Laterality: N/A;  . APPLICATION OF WOUND VAC N/A 01/19/2020   Procedure: APPLICATION OF WOUND VAC;  Surgeon: [name], MD;  Location: [place] [facility];  Service: Thoracic;  Laterality: N/A;  . APPLICATION OF WOUND VAC N/A 02/05/2020   Procedure: WOUND VAC CHANGE;  Surgeon: [name], MD;  Location: [place];  Service: Thoracic;  Laterality: N/A;  . APPLICATION OF WOUND VAC N/A 02/13/2020   Procedure: APPLICATION OF WOUND VAC;  Surgeon: Wallace Going, DO;  Location: [place];  Service: Plastics;  Laterality: N/A;  . APPLICATION OF WOUND VAC N/A 02/23/2020   Procedure: Wound vac change;  Surgeon: Wallace Going, DO;  Location: [place];  Service: Plastics;  Laterality: N/A;  . APPLICATION OF WOUND VAC N/A 03/20/2020   Procedure: APPLICATION OF WOUND VAC;  Surgeon: Wallace Going, DO;  Location: [place];  Service: Plastics;  Laterality: N/A;  . APPLICATION OF WOUND VAC Bilateral 03/13/2020   Procedure: APPLICATION OF WOUND VAC;  Surgeon: Wallace Going, DO;  Location: [place];  Service: Plastics;  Laterality: Bilateral;  . BEDSIDE EVACUATION OF HEMATOMA N/A 01/22/2020   Procedure: CHEST EXPLORATION _0 ;  Surgeon: Ivin Poot, MD;  Location: [place];  Service: Cardiovascular;  Laterality: N/A;  . BEDSIDE EVACUATION OF HEMATOMA N/A 01/17/2020   Procedure: CHEST EXPLORATION _1 ;  Surgeon: [name], MD;  Location: [place];  Service:  Open Heart Surgery;  Laterality: N/A;  . BIOPSY  12/01/2019   Procedure: BIOPSY;  Surgeon: Arta Silence, MD;  Location: [facility];  Service: Endoscopy;;  . CARDIAC CATHETERIZATION    . CARDIAC PACEMAKER PLACEMENT    . CHOLECYSTECTOMY    . COLONOSCOPY    . CORONARY ARTERY BYPASS GRAFT    . CORONARY ARTERY BYPASS GRAFT N/A 01/11/2020   Procedure: CORONARY ARTERY BYPASS GRAFTING (CABG), ON PUMP, TIMES FOUR, USING LEFT INTERNAL MAMMARY ARTERY AND ENDOSCOPICALLY HARVESTED RIGHT GREATER SAPHENOUS VEIN;  Surgeon: [name], MD;  Location: [place];  Service: Open Heart Surgery;  Laterality: N/A;  . ESOPHAGOGASTRODUODENOSCOPY  04/05/2011   Procedure: ESOPHAGOGASTRODUODENOSCOPY (EGD);  Surgeon: Irene Shipper, MD;  Location: [facility];  Service: Endoscopy;  Laterality: N/A;  . ESOPHAGOGASTRODUODENOSCOPY (EGD) WITH PROPOFOL N/A 12/01/2019   Procedure: ESOPHAGOGASTRODUODENOSCOPY (EGD) WITH PROPOFOL;  Surgeon: Arta Silence, MD;  [facility];  Service: Endoscopy;  Laterality: N/A;  . EXPLORATION POST OPERATIVE OPEN HEART N/A 01/24/2020  Procedure: RE-EXPLORATION POST OPERATIVE OPEN HEART;  Surgeon: [name], MD;  [facility]: [facility];  Service: Open Heart Surgery;  Laterality: N/A;  . GASTROSTOMY N/A 01/29/2020   Procedure: OPEN INSERTION OF 16 FR. BOLUS GASTROSTOMY TUBE;  Surgeon: [name], MD;  Location: [place];  Service: Thoracic;  Laterality: N/A;  . GREATER OMENTAL FLAP CLOSURE N/A 02/15/2020   Procedure: GREATER OMENTAL FLAP CLOSURE OF STERNUM;  Surgeon: [name], MD;  Location: [place] [facility];  Service: Thoracic;  Laterality: N/A;  . INCISION AND DRAINAGE OF WOUND N/A 02/17/2020   Procedure: STERNAL WOUND DEBRIDEMENT WITH PARTIAL CLOSURE OF STERNAL WOUND;  Surgeon: Wallace Going, DO;  Location: [place];  Service: Plastics;  Laterality: N/A;  Total 2.5 hours, please  . INCISION AND DRAINAGE OF WOUND N/A 02/28/2020   Procedure: Sternal wound debridement;  Surgeon:  Wallace Going, DO;  Location: [place];  Service: Plastics;  Laterality: N/A;  . INCISION AND DRAINAGE OF WOUND N/A 03/03/2020   Procedure: Sternal wound debridement;  Surgeon: Wallace Going, DO;  Location: [place];  Service: Plastics;  Laterality: N/A;  . JEJUNOSTOMY N/A 02/21/2020   Procedure: 34 FR JEJUNOSTOMY INSERTION ;  Surgeon: [name], MD;  [facility];  Service: Thoracic;  Laterality: N/A;  . JOINT REPLACEMENT     left knee replacement  . KNEE ARTHROSCOPY Right 06/11/2013   Procedure: RIGHT KNEE ARTHROSCOPY with chondroplasty;  Surgeon: Johnny Bridge, MD;  Location: [place];  Service: Orthopedics;  Laterality: Right;  . PLACEMENT OF IMPELLA LEFT VENTRICULAR ASSIST DEVICE N/A 01/28/2020   Procedure: PLACEMENT OF IMPELLA LEFT VENTRICULAR ASSIST DEVICE;  Surgeon: [name], MD;  Location: [name];  Service: Open Heart Surgery;  Laterality: N/A;  . PLACEMENT OF IMPELLA LEFT VENTRICULAR ASSIST DEVICE Right 01/15/2020   Procedure: AXILLARY  ARTERY PLACEMENT OF ABIOMED IMPELLA  5.5 LEFT VENTRICULAR ASSIST DEVICE;  Surgeon: [name], MD;  Location: [place];  Service: Open Heart Surgery;  Laterality: Right;  . REMOVAL OF IMPELLA LEFT VENTRICULAR ASSIST DEVICE N/A 01/30/2020   Procedure: REMOVAL OF IMPELLA LEFT VENTRICULAR ASSIST DEVICE;  Surgeon: [name], MD;  Location: [place];  Service: Open Heart Surgery;  Laterality: N/A;  . RIGHT/LEFT HEART CATH AND CORONARY ANGIOGRAPHY N/A 01/22/2020   Procedure: RIGHT/LEFT HEART CATH AND CORONARY ANGIOGRAPHY;  Surgeon: Larey Dresser, MD;  Location: [place] [facility];  Service: Cardiovascular;  Laterality: N/A;  . STERNAL CLOSURE N/A 01/15/2020   Procedure: STERNAL CLOSURE;  Surgeon: Ivin Poot, MD;  Location: [place];  Service: Thoracic;  Laterality: N/A;  . STERNAL CLOSURE N/A 02/17/2020   Procedure: STERNAL CLOSURE;  Surgeon: [name], MD;  Location: [place];  Service: Thoracic;  Laterality: N/A;  .  STERNAL WOUND DEBRIDEMENT N/A 01/15/2020   Procedure: STERNAL WASH OUT;  Surgeon: [name], MD;  Location: [place];  Service: Thoracic;  Laterality: N/A;  . STERNAL WOUND DEBRIDEMENT N/A 02/16/2020   Procedure: STERNAL WASHOUT;  Surgeon: [name], MD;  Location: [place];  Service: Thoracic;  Laterality: N/A;  . STERNAL WOUND DEBRIDEMENT N/A 02/19/2020   Procedure: STERNAL WASHOUT WITH WOUND VAC CHANGE;  Surgeon: [name], MD;  Location: [place];  Service: Thoracic;  Laterality: N/A;  . STERNAL WOUND DEBRIDEMENT Bilateral 03/07/2020   Procedure: STERNAL WOUND DEBRIDEMENT;  Surgeon: Wallace Going, DO;  Location: [place];  Service: Plastics;  Laterality: Bilateral;  . TEE  WITHOUT CARDIOVERSION N/A 01/23/2020   Procedure: TRANSESOPHAGEAL ECHOCARDIOGRAM (TEE);  Surgeon: [name], MD;  Location: [place];  Service: Open Heart Surgery;  Laterality: N/A;  . TEE WITHOUT CARDIOVERSION N/A 01/10/2020   Procedure: TRANSESOPHAGEAL ECHOCARDIOGRAM (TEE);  Surgeon: [name], MD;  Location: [place];  Service: Open Heart Surgery;  Laterality: N/A;  . TEE WITHOUT CARDIOVERSION N/A 01/11/2020   Procedure: TRANSESOPHAGEAL ECHOCARDIOGRAM (TEE);  Surgeon: Prescott Gum, Collier Salina, MD;  Location: [place];  Service: Thoracic;  Laterality: N/A;  . TEE WITHOUT CARDIOVERSION N/A 01/31/2020   Procedure: TRANSESOPHAGEAL ECHOCARDIOGRAM (TEE);  Surgeon: [name], MD;  Location: [facility];  Service: Thoracic;  Laterality: N/A;  . TEE WITHOUT CARDIOVERSION N/A 02/01/2020   Procedure: TRANSESOPHAGEAL ECHOCARDIOGRAM (TEE);  Surgeon: [name], MD;  Location: [facility];  Service: Thoracic;  Laterality: N/A;  . TEE WITHOUT CARDIOVERSION N/A 01/31/2020   Procedure: TRANSESOPHAGEAL ECHOCARDIOGRAM (TEE);  Surgeon: [name], MD;  Location: [place];  Service: Open Heart Surgery;  Laterality: N/A;  . TOTAL KNEE ARTHROPLASTY Left   . TRACHEOSTOMY TUBE PLACEMENT N/A 02/17/2020   Procedure: OPEN  TRACHEOSTOMY USING #8 SHILEY;  Surgeon: [name], MD;  Location: [place];  Service: Thoracic;  Laterality: N/A;  . WOUND EXPLORATION Bilateral 03/26/2020   Procedure: WOUND EXPLORATION;  Surgeon: Wallace Going, DO;  Location: [place];  Service: Plastics;  Laterality: Bilateral;    Family History  Problem Relation Age of Onset  . Diabetes Mother   . Coronary artery disease Mother 66  . Gout Mother   . Heart disease Mother   . Heart attack Mother 103  . Diabetes Father   . Coronary artery disease Father 74       Died in a house fire   . Heart disease Father   . Heart disease Brother    Social History:  reports that he has never smoked. He has never used smokeless tobacco. He reports that he does not drink alcohol and does not use drugs.  Allergies:  Allergies  Allergen Reactions  . Orange Fruit Anaphylaxis, Hives and Other (See Comments)    Per Pt- Blisters around lips and Hives all over, also  . Penicillins Anaphylaxis    Did it involve swelling of the face/tongue/throat, SOB, or low BP? Yes Did it involve sudden or severe rash/hives, skin peeling, or any reaction on the inside of your mouth or nose? Yes Did you need to seek medical attention at a hospital or doctor's office? No When did it last happen?childhood If all above answers are "NO", may proceed with cephalosporin use.  [name] [Empagliflozin] Itching  . [name] [Insulin Glargine] Nausea And Vomiting    Medications: I have reviewed the patient's current medications.   Physical Examination: Blood pressure 98/68, pulse 94, temperature 98.9 F (37.2 C), temperature source Oral, resp. rate (!) 26, height 6' 3" (1.905 m), weight 81 kg, SpO2 98 %.  General: Chronically ill-appearing male laying in bed, NAD Cardiovascular: RRR, wound VAC in place Respiratory: On ventilator, scattered crackles Gastrointestinal: Soft, bowel sounds present, G-tube to drainage, J-tube in place.  Neurologic  Examination: Mental Status: Alert, awake, unable to assess orientation.  Globally aphasic but some mumbling response to wife present at bedside.  Able to follow some midline and peripheral commands when asked repeatedly.  Cranial Nerves: Right gaze preference/deviation , doll's eyes reflex fully intact to left eye only, with decreased but present movement of right eye. No blink to  visual threat bilaterally, but briefly tracked examiner's moving face to left and right. Does not smile on command but facial light sensation normal bilaterally, hearing normal. Motor: Right : Upper extremity   1/5    Left:     Upper extremity   3/5  Lower extremity   0/5     Lower extremity   1/5 Sensory: Reacts to touch x 4.  Deep Tendon Reflexes:  Right: Upper Extremity   Left: Upper extremity   biceps (C-5 to C-6) 1/4   biceps (C-5 to C-6) 2/4 tricep (C7) 1/4    triceps (C7) 2/4 Brachioradialis (C6) 1/4  Brachioradialis (C6) 2/4  Lower Extremity Lower Extremity  quadriceps (L-2 to L-4) 1/4   quadriceps (L-2 to L-4) 2/4 Achilles (S1) 0/4   Achilles (S1) 0/4 Plantars: Right: [place]   left: downgoing Cerebellar: Unable to assess finger-to-nose,  heel-to-shin test due to weakness Gait: Unable to assess   Results for orders placed or performed during the hospital encounter of 01/02/2020 (from the past 48 hour(s))  Heparin level (unfractionated)     Status: Abnormal   Collection Time: 03/04/20  5:39 PM  Result Value Ref Range   Heparin Unfractionated 0.25 (L) 0.30 - 0.70 IU/mL    Comment: (NOTE) If heparin results are below expected values, and patient dosage has  been confirmed, suggest follow up testing of antithrombin III levels. Performed at [facility], [place] [address], [place], [place] [address]   Glucose, capillary     Status: Abnormal   Collection Time: 03/04/20  8:15 PM  Result Value Ref Range   Glucose-Capillary 153 (H) 70 - 99 mg/dL    Comment: Glucose reference range applies only to  samples taken after fasting for at least 8 hours.  Glucose, capillary     Status: Abnormal   Collection Time: 03/04/20 11:53 PM  Result Value Ref Range   Glucose-Capillary 164 (H) 70 - 99 mg/dL    Comment: Glucose reference range applies only to samples taken after fasting for at least 8 hours.  Cooxemetry Panel (carboxy, met, total hgb, O2 sat)     Status: Abnormal   Collection Time: 03/05/20  3:57 AM  Result Value Ref Range   Total hemoglobin 7.9 (L) 12.0 - 16.0 g/dL   O2 Saturation 74.0 %   Carboxyhemoglobin 2.3 (H) 0.5 - 1.5 %   Methemoglobin 0.9 0.0 - 1.5 %    Comment: Performed at [facility] [address], [place], [place] [address]  Renal function panel (daily at 0500)     Status: Abnormal   Collection Time: 03/05/20  3:57 AM  Result Value Ref Range   Sodium 134 (L) 135 - 145 mmol/L   Potassium 4.3 3.5 - 5.1 mmol/L   Chloride 102 98 - 111 mmol/L   CO2 25 22 - 32 mmol/L   Glucose, Bld 208 (H) 70 - 99 mg/dL    Comment: Glucose reference range applies only to samples taken after fasting for at least 8 hours.   BUN 42 (H) 6 - 20 mg/dL   Creatinine, Ser 0.96 0.61 - 1.24 mg/dL   Calcium 8.2 (L) 8.9 - 10.3 mg/dL   Phosphorus 1.9 (L) 2.5 - 4.6 mg/dL   Albumin 2.0 (L) 3.5 - 5.0 g/dL   GFR, Estimated >60 >60 mL/min    Comment: (NOTE) Calculated using the CKD-EPI Creatinine Equation (2021)    Anion gap 7 5 - 15    Comment: Performed at [facility], [address] [place], [place] [address]  Magnesium     Status: None   Collection Time: 03/05/20  3:57 AM  Result Value Ref Range   Magnesium 2.4 1.7 - 2.4 mg/dL    Comment: Performed at [facility] [address], [place], [place] [address]  CBC with Differential/Platelet     Status: Abnormal   Collection Time: 03/05/20  3:57 AM  Result Value Ref Range   WBC 9.9 4.0 - 10.5 K/uL   RBC 2.97 (L) 4.22 - 5.81 MIL/uL   Hemoglobin 8.1 (L) 13.0 - 17.0 g/dL   HCT 28.0 (L) 39.0 - 52.0 %   MCV 94.3 80.0 - 100.0 fL    MCH 27.3 26.0 - 34.0 pg   MCHC 28.9 (L) 30.0 - 36.0 g/dL   RDW 19.7 (H) 11.5 - 15.5 %   Platelets 203 150 - 400 K/uL   nRBC 0.0 0.0 - 0.2 %   Neutrophils Relative % 81 %   Neutro Abs 8.0 (H) 1.7 - 7.7 K/uL   Lymphocytes Relative 8 %   Lymphs Abs 0.8 0.7 - 4.0 K/uL   Monocytes Relative 7 %   Monocytes Absolute 0.7 0.1 - 1.0 K/uL   Eosinophils Relative 3 %   Eosinophils Absolute 0.3 0.0 - 0.5 K/uL   Basophils Relative 0 %   Basophils Absolute 0.0 0.0 - 0.1 K/uL   Immature Granulocytes 1 %   Abs Immature Granulocytes 0.05 0.00 - 0.07 K/uL    Comment: Performed at [facility], [place] [address] [place], [place] [address]  Heparin level (unfractionated)     Status: Abnormal   Collection Time: 03/05/20  3:57 AM  Result Value Ref Range   Heparin Unfractionated 0.27 (L) 0.30 - 0.70 IU/mL    Comment: (NOTE) If heparin results are below expected values, and patient dosage has  been confirmed, suggest follow up testing of antithrombin III levels. Performed at [facility], [place] [address], [place], [place] [address]   MRSA PCR Screening     Status: None   Collection Time: 03/05/20  3:57 AM   Specimen: Nasopharyngeal  Result Value Ref Range   MRSA by PCR NEGATIVE NEGATIVE    Comment:        The GeneXpert MRSA Assay (FDA approved for NASAL specimens only), is one component of a comprehensive MRSA colonization surveillance program. It is not intended to diagnose MRSA infection nor to guide or monitor treatment for MRSA infections. Performed at [facility], [place] [address] [place], [place] [address]   Glucose, capillary     Status: Abnormal   Collection Time: 03/05/20  3:57 AM  Result Value Ref Range   Glucose-Capillary 196 (H) 70 - 99 mg/dL    Comment: Glucose reference range applies only to samples taken after fasting for at least 8 hours.  Glucose, capillary     Status: Abnormal   Collection Time: 03/05/20  7:27 AM  Result Value Ref Range    Glucose-Capillary 164 (H) 70 - 99 mg/dL    Comment: Glucose reference range applies only to samples taken after fasting for at least 8 hours.  Glucose, capillary     Status: Abnormal   Collection Time: 03/05/20 11:47 AM  Result Value Ref Range   Glucose-Capillary 196 (H) 70 - 99 mg/dL    Comment: Glucose reference range applies only to samples taken after fasting for at least 8 hours.  Glucose, capillary  Status: Abnormal   Collection Time: 03/05/20  3:57 PM  Result Value Ref Range   Glucose-Capillary 156 (H) 70 - 99 mg/dL    Comment: Glucose reference range applies only to samples taken after fasting for at least 8 hours.  Renal function panel (daily at 1600)     Status: Abnormal   Collection Time: 03/05/20  8:01 PM  Result Value Ref Range   Sodium 134 (L) 135 - 145 mmol/L   Potassium 4.9 3.5 - 5.1 mmol/L   Chloride 101 98 - 111 mmol/L   CO2 26 22 - 32 mmol/L   Glucose, Bld 194 (H) 70 - 99 mg/dL    Comment: Glucose reference range applies only to samples taken after fasting for at least 8 hours.   BUN 48 (H) 6 - 20 mg/dL   Creatinine, Ser 0.99 0.61 - 1.24 mg/dL   Calcium 8.1 (L) 8.9 - 10.3 mg/dL   Phosphorus 1.5 (L) 2.5 - 4.6 mg/dL   Albumin 2.1 (L) 3.5 - 5.0 g/dL   GFR, Estimated >60 >60 mL/min    Comment: (NOTE) Calculated using the CKD-EPI Creatinine Equation (2021)    Anion gap 7 5 - 15    Comment: Performed at [facility] [address], [place], [place] [address]  Glucose, capillary     Status: Abnormal   Collection Time: 03/05/20  8:04 PM  Result Value Ref Range   Glucose-Capillary 170 (H) 70 - 99 mg/dL    Comment: Glucose reference range applies only to samples taken after fasting for at least 8 hours.  Glucose, capillary     Status: Abnormal   Collection Time: 03/06/20 12:30 AM  Result Value Ref Range   Glucose-Capillary 172 (H) 70 - 99 mg/dL    Comment: Glucose reference range applies only to samples taken after fasting for at least 8 hours.  Glucose,  capillary     Status: Abnormal   Collection Time: 03/06/20  4:30 AM  Result Value Ref Range   Glucose-Capillary 194 (H) 70 - 99 mg/dL    Comment: Glucose reference range applies only to samples taken after fasting for at least 8 hours.  Cooxemetry Panel (carboxy, met, total hgb, O2 sat)     Status: Abnormal   Collection Time: 03/06/20  4:43 AM  Result Value Ref Range   Total hemoglobin 12.7 12.0 - 16.0 g/dL   O2 Saturation 60.8 %   Carboxyhemoglobin 1.8 (H) 0.5 - 1.5 %   Methemoglobin 0.8 0.0 - 1.5 %    Comment: Performed at [facility] [address] [place], [place] [address]  Magnesium     Status: Abnormal   Collection Time: 03/06/20  4:55 AM  Result Value Ref Range   Magnesium 2.5 (H) 1.7 - 2.4 mg/dL    Comment: Performed at [facility] [address], [place],  [address]  CBC with Differential/Platelet     Status: Abnormal   Collection Time: 03/06/20  4:55 AM  Result Value Ref Range   WBC 9.7 4.0 - 10.5 K/uL   RBC 3.02 (L) 4.22 - 5.81 MIL/uL   Hemoglobin 8.4 (L) 13.0 - 17.0 g/dL   HCT 27.8 (L) 39.0 - 52.0 %   MCV 92.1 80.0 - 100.0 fL   MCH 27.8 26.0 - 34.0 pg   MCHC 30.2 30.0 - 36.0 g/dL   RDW 19.4 (H) 11.5 - 15.5 %   Platelets 205 150 - 400 K/uL   nRBC 0.0 0.0 -  0.2 %   Neutrophils Relative % 79 %   Neutro Abs 7.7 1.7 - 7.7 K/uL   Lymphocytes Relative 9 %   Lymphs Abs 0.9 0.7 - 4.0 K/uL   Monocytes Relative 7 %   Monocytes Absolute 0.7 0.1 - 1.0 K/uL   Eosinophils Relative 4 %   Eosinophils Absolute 0.4 0.0 - 0.5 K/uL   Basophils Relative 0 %   Basophils Absolute 0.0 0.0 - 0.1 K/uL   Immature Granulocytes 1 %   Abs Immature Granulocytes 0.05 0.00 - 0.07 K/uL    Comment: Performed at [facility] [address] [place], [place] [address]  Heparin level (unfractionated)     Status: None   Collection Time: 03/06/20  4:55 AM  Result Value Ref Range   Heparin Unfractionated 0.44 0.30 - 0.70 IU/mL    Comment: (NOTE) If heparin results  are below expected values, and patient dosage has  been confirmed, suggest follow up testing of antithrombin III levels. Performed at [facility], [place] [address] [place], [place] [address]   Renal function panel     Status: Abnormal   Collection Time: 03/06/20  4:55 AM  Result Value Ref Range   Sodium 134 (L) 135 - 145 mmol/L   Potassium 4.6 3.5 - 5.1 mmol/L   Chloride 102 98 - 111 mmol/L   CO2 25 22 - 32 mmol/L   Glucose, Bld 198 (H) 70 - 99 mg/dL    Comment: Glucose reference range applies only to samples taken after fasting for at least 8 hours.   BUN 43 (H) 6 - 20 mg/dL   Creatinine, Ser 1.00 0.61 - 1.24 mg/dL   Calcium 8.2 (L) 8.9 - 10.3 mg/dL   Phosphorus 1.8 (L) 2.5 - 4.6 mg/dL   Albumin 2.0 (L) 3.5 - 5.0 g/dL   GFR, Estimated >60 >60 mL/min    Comment: (NOTE) Calculated using the CKD-EPI Creatinine Equation (2021)    Anion gap 7 5 - 15    Comment: Performed at [place] [address], [place], [place] [address]  Glucose, capillary     Status: Abnormal   Collection Time: 03/06/20  7:52 AM  Result Value Ref Range   Glucose-Capillary 156 (H) 70 - 99 mg/dL    Comment: Glucose reference range applies only to samples taken after fasting for at least 8 hours.  Glucose, capillary     Status: Abnormal   Collection Time: 03/06/20 11:40 AM  Result Value Ref Range   Glucose-Capillary 182 (H) 70 - 99 mg/dL    Comment: Glucose reference range applies only to samples taken after fasting for at least 8 hours.  Renal function panel (daily at 1600)     Status: Abnormal   Collection Time: 03/06/20  2:37 PM  Result Value Ref Range   Sodium 133 (L) 135 - 145 mmol/L   Potassium 5.2 (H) 3.5 - 5.1 mmol/L   Chloride 100 98 - 111 mmol/L   CO2 22 22 - 32 mmol/L   Glucose, Bld 115 (H) 70 - 99 mg/dL    Comment: Glucose reference range applies only to samples taken after fasting for at least 8 hours.   BUN 59 (H) 6 - 20 mg/dL   Creatinine, Ser 1.24 0.61 - 1.24 mg/dL    Calcium 8.5 (L) 8.9 - 10.3 mg/dL   Phosphorus 1.8 (L) 2.5 - 4.6 mg/dL   Albumin 2.2 (L) 3.5 - 5.0 g/dL   GFR, Estimated >60 >60  mL/min    Comment: (NOTE) Calculated using the CKD-EPI Creatinine Equation (2021)    Anion gap 11 5 - 15    Comment: Performed at [facility], [place] [address], [place], [place] [address]  Heparin level (unfractionated)     Status: None   Collection Time: 03/06/20  2:37 PM  Result Value Ref Range   Heparin Unfractionated 0.30 0.30 - 0.70 IU/mL    Comment: (NOTE) If heparin results are below expected values, and patient dosage has  been confirmed, suggest follow up testing of antithrombin III levels. Performed at [facility], [place] [address] [place], [place] [address]   Glucose, capillary     Status: Abnormal   Collection Time: 03/06/20  3:43 PM  Result Value Ref Range   Glucose-Capillary 155 (H) 70 - 99 mg/dL    Comment: Glucose reference range applies only to samples taken after fasting for at least 8 hours.   Imaging Studies  CT Head wo contrast  03/06/2020: 1. Apparent acute infarct involving portions of the left posterior temporal and medial to mid left occipital lobes as well as the posterior limb of the left internal capsule and much of the mid to lateral left thalamus. This large infarct is causing significant effacement of the atrium of the left lateral ventricle. 2.  No mass or hemorrhage evident.  No midline shift. 3.  There is periventricular small vessel disease. 4.  Foci of arterial vascular calcification noted.  Echocardiogram 02/13/2020: Severe biventricular failure. No significant effusion. Pacing wire in  RV. Markedly abnormal septal motion. Impellar device about 4.6 cm distal  to arotic annulus with no entrapment.   Echocardiogram 03/06/2020: Pending    Assessment: 50 y.o. male with PMHx of CAD,  automatic implantable cardioverter-defibrillator in situ (2007), congestive heart failure, anginal pain, anxiety,  arthritis, bipolar disorder, cancer, chondrocalcinosis of right knee, depression, diabetes mellitus type 2, dilated cardiomyopathy, dyslipidemia, dysrhythmia, GERD, gout, hepatic steatosis, kidney stones, hypertension, obesity, proximal atrial fibrillation, peptic ulcer, obstructive sleep apnea. He was admitted on 12/28/2020 with A. fib/RVR, torsades, cardiogenic shock (EF 15) requiring Impella placement (removed 1/21), CABG*4 c/b cardiac tamponade requiring chest reopening now with VAC, AKI require on CRRT. Last procedure was on 03/26/2020 for re-exploration of sternal wound due to bleeding.  CT head shows acute infarct involving portion of left posterior temporal and medial to medial left occipital lobes as well as posterior limb of left internal capsule.   - Stroke is most likely cardioembolic in nature due to severe biventricular failure seen on echocardiogram. This stroke happened while on heparin IV drip. - Exam findings are congruent with the location of the large left sided PCA territory stroke seen on CT - Stroke Risk Factors - atrial fibrillation, diabetes mellitus, hyperlipidemia, hypertension and CAD, CHF, Cardiomyopathy, Obstructive sleep apnea.  Recommendations: 1. HgbA1c, fasting lipid panel 2. Recommend CTA head and neck, if stable per primary team 3. Recommend MR Brain without contrast, if no contraindication per primary team 4.  Agree with continuing IV heparin- benefits of heparin for prevention of recurrent stroke are felt to outweigh the risk of hemorrhagic conversion.  5. PT consult, OT consult, Speech consult 6. Risk factor modification 8. Frequent neuro checks  Honor Junes, MD Resident, PGY1  I have seen and examined the patient. I have formulated and discussed the assessment and recommendations with Dr. Demaris Callander. 50 year old male with subacute large left PCA territory stroke. Exam findings include right sided weakness and aphasia. Etiology most  likely cardioembolic.  Recommendations as above.  Electronically signed: Dr. Kerney Elbe

## 2020-03-06 NOTE — Progress Notes (Signed)
Patient ID: Corey Palmer, male   DOB: [birth year], 50 y.o.   MRN: [id number]     Advanced Heart Failure Rounding Note  PCP-Cardiologist: No primary care provider on file.   Subjective:    - 12/7 Torsades -ICD shock x1.  - 12/11 Impella 5.5 placed.  - 12/12 Impella repositioned in OR - 12/15 CABG x 4 with LIMA-LAD, SVG-PDA/PLV, SVG-OM - 12/16 DCCV afib.  Back to OR to re-open chest and again to evacuate hematoma.  - 12/19 CVVH initiated  - 12/23 Chest closed - 12/24 Chest reopened - 12/26 RAP back into NSR - 12/27 back in atrial fibrillation, unable to rapid atrial pace out.   - 12/28 To OR, chest partially closed and wound vac placed.  DCCV to NSR.  - 12/31 Antibiotics stopped. Swan removed, HD catheter moved from right femoral to right IJ.  - 1/5 To OR for chest closure. Chest reopened emergently at bedside again to evacuate hematoma  - 1/10 To OR: omental flap closure of chest with wound vac, tracheostomy, G-tube, J-tube.    - 1/12 Plastics and EP consulted.  - 1/13 Palliative Care consulted.  - 1/15 Went into VT, Shock 200 J and amiodarone bolus 150.   - 1/16: Milrinone stopped.    - 1/17: Partial closure of chest in OR - 1/18 Switched to dobutamine 4 mcg - 1/21 Impella Extracted.  Developed hyperkalemia when CVVH held with wide QRS rhythm and required transient external pacing.   - 1/27 OR for wound vac change, partial chest wall closure.  - 2/2 S/P sternal debridement , closure part of sternal wound, A cell  - 2/3 Return to OR for re-exploration of sternal wound due to bleeding.   Remains on dobutamine 4 mcg/kg/min. CVVH running even. CVP 5.  Co-ox 60%.    He is on heparin gtt, hgb 8.4.      Objective:   Weight Range: 81 kg Body mass index is 22.32 kg/m.   Vital Signs:   Temp:  [97.1 F (36.2 C)-97.9 F (36.6 C)] 97.1 F (36.2 C) (02/07 0745) Pulse Rate:  [77-85] 79 (02/07 0809) Resp:  [18-29] 22 (02/07 0809) BP: (88-119)/(48-70) 90/59 (02/07 0809) SpO2:  [100  %] 100 % (02/07 0809) Arterial Line BP: (95-120)/(43-55) 109/55 (02/07 0809) FiO2 (%):  [30 %] 30 % (02/07 0825) Weight:  [81 kg] 81 kg (02/07 0600) Last BM Date: 03/05/20  Weight change: Filed Weights   03/04/20 0358 03/05/20 0500 03/06/20 0600  Weight: 81.1 kg 81.4 kg 81 kg    Intake/Output:   Intake/Output Summary (Last 24 hours) at 03/06/2020 0849 Last data filed at 03/06/2020 0800 Gross per 24 hour  Intake 3519.93 ml  Output 4144 ml  Net -624.07 ml      Physical Exam   General: Sedated on vent Neck: No JVD, no thyromegaly or thyroid nodule.  Lungs: Clear to auscultation bilaterally with normal respiratory effort. CV: Wound vac on chest.  Heart regular S1/S2, no S3/S4, no murmur.  No peripheral edema.   Abdomen: Soft, nontender, no hepatosplenomegaly, no distention.  Skin: Intact without lesions or rashes.  Neurologic: Will awaken and follow commands.  Extremities: No clubbing or cyanosis.  HEENT: Normal.    Telemetry   NSR 80s.  Personally reviewed.   Labs    CBC Recent Labs    03/05/20 0357 03/06/20 0455  WBC 9.9 9.7  NEUTROABS 8.0* 7.7  HGB 8.1* 8.4*  HCT 28.0* 27.8*  MCV 94.3 92.1  PLT 203 205  Basic Metabolic Panel Recent Labs    03/05/20 0357 03/05/20 2001 03/06/20 0455  NA 134* 134* 134*  K 4.3 4.9 4.6  CL 102 101 102  CO2 _0 GLUCOSE 208* 194* 198*  BUN 42* 48* 43*  CREATININE 0.96 0.99 1.00  CALCIUM 8.2* 8.1* 8.2*  MG 2.4  --  2.5*  PHOS 1.9* 1.5* 1.8*   Liver Function Tests Recent Labs    03/05/20 2001 03/06/20 0455  ALBUMIN 2.1* 2.0*   No results for input(s): LIPASE, AMYLASE in the last 72 hours. Cardiac Enzymes No results for input(s): CKTOTAL, CKMB, CKMBINDEX, TROPONINI in the last 72 hours.  BNP: BNP (last 3 results) Recent Labs    01/21/2020 1619  BNP 577.5*    ProBNP (last 3 results) No results for input(s): PROBNP in the last 8760 hours.   D-Dimer No results for input(s): DDIMER in the last 72  hours. Hemoglobin A1C No results for input(s): HGBA1C in the last 72 hours. Fasting Lipid Panel No results for input(s): CHOL, HDL, LDLCALC, TRIG, CHOLHDL, LDLDIRECT in the last 72 hours. Thyroid Function Tests No results for input(s): TSH, T4TOTAL, T3FREE, THYROIDAB in the last 72 hours.  Invalid input(s): FREET3  Other results:   Imaging    DG Abd 1 View  Result Date: 03/06/2020 CLINICAL DATA:  50 year old male with history of atrial fibrillation, RVR, tore sides, cardiogenic shock, CABG. Peg tube placed last month. EXAM: ABDOMEN - 1 VIEW COMPARISON:  Portable abdomen 02/09/2020 and earlier. FINDINGS: Portable AP supine view at 0325 hours. Partially visible cardiac AICD. Visible lung bases appear negative. There are 2 left side percutaneous enteric tubes, the lower tube appears to be a J-tube based on CT Abdomen and Pelvis 02/12/2020. Stable cholecystectomy clips. Visualized bowel gas pattern is non obstructed. No acute osseous abnormality identified. IMPRESSION: G-tube and J-tube in place with non obstructed bowel gas pattern. Electronically Signed   By: Genevie Ann M.D.   On: 03/06/2020 04:40     Medications:     Scheduled Medications: . amiodarone  200 mg Per Tube BID  . vitamin C  500 mg Per Tube TID  . aspirin  324 mg Per Tube Daily  . atorvastatin  80 mg Per Tube Daily  . busPIRone  10 mg Per Tube TID  . chlorhexidine gluconate (MEDLINE KIT)  15 mL Mouth Rinse BID  . Chlorhexidine Gluconate Cloth  6 each Topical Daily  . clonazepam  1 mg Oral TID  . darbepoetin (ARANESP) injection - NON-DIALYSIS  100 mcg Subcutaneous Q Sat-1800  . docusate  100 mg Per Tube BID  . feeding supplement (PROSource TF)  45 mL Per Tube TID  . fentaNYL (SUBLIMAZE) injection  50 mcg Intravenous Once  . fiber  1 packet Per Tube BID  . FLUoxetine  40 mg Per Tube BID  . HYDROmorphone  4 mg Per Tube Q6H  . insulin aspart  0-9 Units Subcutaneous Q4H  . insulin aspart  4 Units Subcutaneous Q4H  .  insulin detemir  12 Units Subcutaneous Q12H  . LORazepam  0.5 mg Intravenous Q4H  . mouth rinse  15 mL Mouth Rinse 10 times per day  . melatonin  5 mg Per Tube Daily  . mexiletine  150 mg Per Tube Q8H  . midodrine  20 mg Per Tube TID WC  . multivitamin  1 tablet Per Tube QHS  . nutrition supplement (JUVEN)  1 packet Per Tube BID BM  . pantoprazole (PROTONIX) IV  40 mg Intravenous Q12H  . polyethylene glycol  17 g Per Tube Daily  . sodium chloride flush  10-40 mL Intracatheter Q12H  . thiamine  100 mg Per Tube Daily  . valproic acid  250 mg Per Tube BID    Infusions: . sodium chloride Stopped (03/18/2020 0753)  . sodium chloride    . dexmedetomidine (PRECEDEX) IV infusion 0.3 mcg/kg/hr (03/06/20 0800)  . dextrose Stopped (03/24/2020 1520)  . DOBUTamine 4 mcg/kg/min (03/06/20 0800)  . feeding supplement (PIVOT 1.5 CAL) 75 mL/hr at 03/06/20 0400  . fentaNYL infusion INTRAVENOUS 25 mcg/hr (03/06/20 0800)  . heparin 1,600 Units/hr (03/06/20 5188)  . lactated ringers Stopped (02/29/20 0835)  . norepinephrine (LEVOPHED) Adult infusion Stopped (03/04/20 0815)    PRN Medications: sodium chloride, Place/Maintain arterial line **AND** sodium chloride, artificial tears, bisacodyl **OR** bisacodyl, dextrose, docusate, fentaNYL, heparin, heparin, lip balm, loperamide HCl, LORazepam, metoprolol tartrate, nystatin, polyethylene glycol, sodium chloride flush     Assessment/Plan   1. Cardiogenic shock/acute on chronic systolic CHF: Initially nonischemic cardiomyopathy.  Possible familial cardiomyopathy as both parents had cardiomyopathy and died at around 45. However, [employer] gene testing did not show any common mutation for cardiomyopathy.  However, this admission noted to have severe 3 vessel disease so suspect component of ischemic cardiomyopathy.  St Jude ICD.  Echo in 8/20 with EF 15% and mildly decreased RV function. Echo this admission with EF < 20%, moderate LV dilation, RV mildly reduced,  severe LAE, no significant MR. Low output HF with markedly low EF.  He has a long history of cardiomyopathy (20 yrs), tends to minimize symptoms.  Cardiorenal syndrome with creatinine up to 2.2,  stabilized on milrinone and Impella 5.5. SCr 1.44 day of CABG. CABG 12/15.  Post-op shock, back to OR 12/16 to open chest (chest wall compartment syndrome) and again to evacuate hematoma.  TEE post-op with severe RV dysfunction.  CVVHD initiated 12/19 for volume removal in setting of rising Scr/decreased UOP. Suspect ATN. S/p chest closure 12/23. Hemodynamics much worse on 12/24 with rising pressor demands. Co-ox 36%.  Bedside echo severe biventricular dysfunction with marked septal bounce. Chest re-opened 12/24.  Was atrial paced back to NSR on 12/26 with improvement in hemodynamics but back in atrial fibrillation on 12/27 and unable to pace out, DCCV to NSR on 12/28. Went back to OR 1/5 for chest closure but several hours later required emergent re-opening of chest at bedside to evacuate hematoma. To OR again 1/10 with closure of chest with omental flap with G & J tube.  1/17 partial closure. 1/21 Impella removed.  Off CVVH post-removal, he developed hyperkalemia and wide QRS rhythm requiring transient external pacing, also with suspected septic shock (low SVR, fever, rising WBCs).  Back to OR 2/3 for bleeding at chest wall wound vac.  This morning, he is on dobutamine 4.  Co-ox 60% with CVVH running even. CVP 5 - Continue midodrine 20 TID and decrease dobutamine to 3. - Not candidate to replace Impella (at this point, not candidate for LVAD or transplant).  - Plan to stop CVVH today, attempt iHD tomorrow.  To try to prepare for this, will try to decrease IV sedation and ramp up po regimen (per CCM).  2. Atrial fibrillation: H/o PAF.  He was on dofetilide in the remote past but this was stopped due to noncompliance.  He had an upper GI bleed from antral ulcers in 3/12. He was seen by GI and was deemed safe to restart  anticoagulation  as long as he remains on a PPI.  No apparent recurrence of AF until just prior to this admission, was cardioverted back to NSR in ER but went back into atrial fibrillation.  Post-op afib, DCCV to NSR/BiV pacing am 12/16 -> back to AF. Rapid atrial pacing back to SR. Remains in NSR - Continue amiodarone per tube 200 mg bid.  - heparin gtt for now, eventual Eliquis.    3. CAD:   Cath this admission with severe 3 vessel disease. CABG x 4 on 12/15.  - Continue atorvastatin, ASA.  4. Acute on chronic hypoxemic respiratory failure: Vent per CCM.  Has tracheostomy. Has been getting PSV trials. - Trial trach collar trials when sedation further weaned (per CCM).  5. AKI on CKD stage 3: Likely ATN/cardiorenal. Anuric. On CVVH.    - Have discussed plan with renal, CVVH to stop today, attempt iHD tomorrow.    6. Diabetes: Insulin.  7. Hyponatremia: Resolved with CVVH.  8. Torsades/ICD shock: 12/7/212 in hospital event, in setting of severe hypokalemia and hypomagnesemia.   9. Gout: h/o gout. Complained of rt knee pain c/w previous flares - treated w/ prednisone burst (completed).  10: ID: Septic shock 1/21 with fever, low SVR, and elevated WBCs. Cultures no growth with exception of axillary cx on 1/21 grew staph epidermidis.  IJ catheter removed. Completed hydrocortisone.  - Off abx. 11. FEN: c/w TFs.  12. Anemia: Hgb 8.4 today.  Transfuse < 8.  13. Stage II Pressure Ulcer- sacrum: Continue to reposition every 2 hours R/L  14. Pleural Effusion: Loculated rt apical pleural effusion noted on imaging. Continue UF through CVVH.  PCCM following  15. VT: Amiodarone + mexiletine.   16. Deconditioning: Profound. Work with PT, nursing to work on range of motion.   Poor prognosis at this point, chest wall is not healing well. He will remain full code until his son from the [place] has had time to see him (early this week), then transition to DNR.  Will attempt iHD tomorrow and stop CVVH today.   Will need comfort care if does not tolerate iHD.  If he tolerates iHD, continue moving towards LTAC.   CRITICAL CARE Performed by: Loralie Champagne  Total critical care time: 35 minutes  Critical care time was exclusive of separately billable procedures and treating other patients.  Critical care was necessary to treat or prevent imminent or life-threatening deterioration.  Critical care was time spent personally by me on the following activities: development of treatment plan with patient and/or surrogate as well as nursing, discussions with consultants, evaluation of patient's response to treatment, examination of patient, obtaining history from patient or surrogate, ordering and performing treatments and interventions, ordering and review of laboratory studies, ordering and review of radiographic studies, pulse oximetry and re-evaluation of patient's condition.  Loralie Champagne 03/06/2020 8:49 AM

## 2020-03-06 NOTE — Progress Notes (Signed)
[facility] BRIEF SICU PROGRESS NOTE  4 Days Post-Op  S/P Procedure(s) (LRB): STERNAL WOUND DEBRIDEMENT (Bilateral) WOUND EXPLORATION (Bilateral) APPLICATION OF WOUND VAC (Bilateral)   Stable day No improvement in neuro status  Plan: Continue current plan  Rexene Alberts, MD 03/06/2020 5:40 PM

## 2020-03-06 NOTE — Progress Notes (Signed)
[facility] Progress Note Patient Name: Corey Palmer DOB: [birth year] MRN: [id number]   Date of Service  03/06/2020  HPI/Events of Note  Fever to 100.2 F - Nursing request for Tylenol. AST = 57 and ALT = 51, therefore, can't use Tylenol. Blood cultures X 2 already ordered.   eICU Interventions  Plan: 1. Tracheal aspirate culture now.  2. Ice packs PRN.     Intervention Category Major Interventions: Other:  Corey Palmer Cornelia Copa 03/06/2020, 8:00 PM

## 2020-03-06 NOTE — Progress Notes (Signed)
Decreased movement noted on right side today compared to left.    CT head ordered, shows acute large left-sided posterior temporal and medial occipital lobe ischemic CVA.    Patient has been on IV heparin for PAF, but has been on and off with surgeries and bleeding.  Suspect CVA is related to AF/LA thrombus.   Will ask neurology to see urgently, may need to stop heparin given size of CVA.   This will worsen his overall prognosis.   Loralie Champagne 03/06/2020 2:14 PM

## 2020-03-06 NOTE — Progress Notes (Signed)
RT unable to obtain sputum for culture. Trap left in place, will try again at a later time. RN aware.

## 2020-03-06 NOTE — Progress Notes (Signed)
  Echocardiogram 2D Echocardiogram has been performed.  Corey Palmer 03/06/2020, 2:51 PM

## 2020-03-07 ENCOUNTER — Telehealth (HOSPITAL_COMMUNITY): Payer: BC Managed Care – PPO | Admitting: Psychiatry

## 2020-03-07 ENCOUNTER — Inpatient Hospital Stay (HOSPITAL_COMMUNITY): Payer: BC Managed Care – PPO

## 2020-03-07 DIAGNOSIS — R52 Pain, unspecified: Secondary | ICD-10-CM | POA: Diagnosis not present

## 2020-03-07 DIAGNOSIS — D72829 Elevated white blood cell count, unspecified: Secondary | ICD-10-CM

## 2020-03-07 DIAGNOSIS — N186 End stage renal disease: Secondary | ICD-10-CM | POA: Diagnosis not present

## 2020-03-07 DIAGNOSIS — I6340 Cerebral infarction due to embolism of unspecified cerebral artery: Secondary | ICD-10-CM | POA: Insufficient documentation

## 2020-03-07 DIAGNOSIS — J9601 Acute respiratory failure with hypoxia: Secondary | ICD-10-CM | POA: Diagnosis not present

## 2020-03-07 DIAGNOSIS — R0600 Dyspnea, unspecified: Secondary | ICD-10-CM

## 2020-03-07 DIAGNOSIS — I4891 Unspecified atrial fibrillation: Secondary | ICD-10-CM | POA: Diagnosis not present

## 2020-03-07 DIAGNOSIS — Z515 Encounter for palliative care: Secondary | ICD-10-CM | POA: Diagnosis not present

## 2020-03-07 DIAGNOSIS — R570 Cardiogenic shock: Secondary | ICD-10-CM | POA: Diagnosis not present

## 2020-03-07 DIAGNOSIS — N179 Acute kidney failure, unspecified: Secondary | ICD-10-CM | POA: Diagnosis not present

## 2020-03-07 DIAGNOSIS — I63432 Cerebral infarction due to embolism of left posterior cerebral artery: Secondary | ICD-10-CM

## 2020-03-07 LAB — COMPREHENSIVE METABOLIC PANEL
ALT: 120 U/L — ABNORMAL HIGH (ref 0–44)
AST: 100 U/L — ABNORMAL HIGH (ref 15–41)
Albumin: 1.8 g/dL — ABNORMAL LOW (ref 3.5–5.0)
Alkaline Phosphatase: 233 U/L — ABNORMAL HIGH (ref 38–126)
Anion gap: 12 (ref 5–15)
BUN: 78 mg/dL — ABNORMAL HIGH (ref 6–20)
CO2: 20 mmol/L — ABNORMAL LOW (ref 22–32)
Calcium: 8.1 mg/dL — ABNORMAL LOW (ref 8.9–10.3)
Chloride: 99 mmol/L (ref 98–111)
Creatinine, Ser: 1.8 mg/dL — ABNORMAL HIGH (ref 0.61–1.24)
GFR, Estimated: 45 mL/min — ABNORMAL LOW (ref >60)
Glucose, Bld: 230 mg/dL — ABNORMAL HIGH (ref 70–99)
Potassium: 4.3 mmol/L (ref 3.5–5.1)
Sodium: 131 mmol/L — ABNORMAL LOW (ref 135–145)
Total Bilirubin: 1 mg/dL (ref 0.3–1.2)
Total Protein: 5.8 g/dL — ABNORMAL LOW (ref 6.5–8.1)

## 2020-03-07 LAB — RENAL FUNCTION PANEL
Albumin: 2 g/dL — ABNORMAL LOW (ref 3.5–5.0)
Albumin: 1.9 g/dL — ABNORMAL LOW (ref 3.5–5.0)
Anion gap: 11 (ref 5–15)
Anion gap: 11 (ref 5–15)
BUN: 93 mg/dL — ABNORMAL HIGH (ref 6–20)
BUN: 63 mg/dL — ABNORMAL HIGH (ref 6–20)
CO2: 19 mmol/L — ABNORMAL LOW (ref 22–32)
CO2: 21 mmol/L — ABNORMAL LOW (ref 22–32)
Calcium: 8.4 mg/dL — ABNORMAL LOW (ref 8.9–10.3)
Calcium: 7.8 mg/dL — ABNORMAL LOW (ref 8.9–10.3)
Chloride: 99 mmol/L (ref 98–111)
Chloride: 99 mmol/L (ref 98–111)
Creatinine, Ser: 2.05 mg/dL — ABNORMAL HIGH (ref 0.61–1.24)
Creatinine, Ser: 1.56 mg/dL — ABNORMAL HIGH (ref 0.61–1.24)
GFR, Estimated: 39 mL/min — ABNORMAL LOW (ref >60)
GFR, Estimated: 53 mL/min — ABNORMAL LOW (ref >60)
Glucose, Bld: 309 mg/dL — ABNORMAL HIGH (ref 70–99)
Glucose, Bld: 318 mg/dL — ABNORMAL HIGH (ref 70–99)
Phosphorus: 2.6 mg/dL (ref 2.5–4.6)
Phosphorus: 2.3 mg/dL — ABNORMAL LOW (ref 2.5–4.6)
Potassium: 5.5 mmol/L — ABNORMAL HIGH (ref 3.5–5.1)
Potassium: 3.8 mmol/L (ref 3.5–5.1)
Sodium: 129 mmol/L — ABNORMAL LOW (ref 135–145)
Sodium: 131 mmol/L — ABNORMAL LOW (ref 135–145)

## 2020-03-07 LAB — HEPATITIS B CORE ANTIBODY, IGM: Hep B C IgM: NONREACTIVE

## 2020-03-07 LAB — COOXEMETRY PANEL
Carboxyhemoglobin: 2.2 % — ABNORMAL HIGH (ref 0.5–1.5)
Methemoglobin: 1 % (ref 0.0–1.5)
O2 Saturation: 76.8 %
Total hemoglobin: 8.6 g/dL — ABNORMAL LOW (ref 12.0–16.0)

## 2020-03-07 LAB — GLUCOSE, CAPILLARY
Glucose-Capillary: 265 mg/dL — ABNORMAL HIGH (ref 70–99)
Glucose-Capillary: 164 mg/dL — ABNORMAL HIGH (ref 70–99)
Glucose-Capillary: 273 mg/dL — ABNORMAL HIGH (ref 70–99)
Glucose-Capillary: 302 mg/dL — ABNORMAL HIGH (ref 70–99)
Glucose-Capillary: 188 mg/dL — ABNORMAL HIGH (ref 70–99)

## 2020-03-07 LAB — LIPID PANEL
Cholesterol: 65 mg/dL (ref 0–200)
HDL: 39 mg/dL — ABNORMAL LOW (ref >40)
LDL Cholesterol: 15 mg/dL (ref 0–99)
Total CHOL/HDL Ratio: 1.7 RATIO
Triglycerides: 56 mg/dL (ref <150)
VLDL: 11 mg/dL (ref 0–40)

## 2020-03-07 LAB — HEPATITIS B SURFACE ANTIBODY,QUALITATIVE: Hep B S Ab: REACTIVE — AB

## 2020-03-07 LAB — CBC WITH DIFFERENTIAL/PLATELET
Abs Immature Granulocytes: 0.22 10*3/uL — ABNORMAL HIGH (ref 0.00–0.07)
Basophils Absolute: 0.1 10*3/uL (ref 0.0–0.1)
Basophils Relative: 1 %
Eosinophils Absolute: 0.1 10*3/uL (ref 0.0–0.5)
Eosinophils Relative: 1 %
HCT: 28.8 % — ABNORMAL LOW (ref 39.0–52.0)
Hemoglobin: 8.8 g/dL — ABNORMAL LOW (ref 13.0–17.0)
Immature Granulocytes: 1 %
Lymphocytes Relative: 10 %
Lymphs Abs: 1.9 10*3/uL (ref 0.7–4.0)
MCH: 27.6 pg (ref 26.0–34.0)
MCHC: 30.6 g/dL (ref 30.0–36.0)
MCV: 90.3 fL (ref 80.0–100.0)
Monocytes Absolute: 1.6 10*3/uL — ABNORMAL HIGH (ref 0.1–1.0)
Monocytes Relative: 8 %
Neutro Abs: 15.2 10*3/uL — ABNORMAL HIGH (ref 1.7–7.7)
Neutrophils Relative %: 79 %
Platelets: 311 10*3/uL (ref 150–400)
RBC: 3.19 MIL/uL — ABNORMAL LOW (ref 4.22–5.81)
RDW: 18.9 % — ABNORMAL HIGH (ref 11.5–15.5)
WBC: 19.2 10*3/uL — ABNORMAL HIGH (ref 4.0–10.5)
nRBC: 0 % (ref 0.0–0.2)

## 2020-03-07 LAB — LACTIC ACID, PLASMA
Lactic Acid, Venous: 1.2 mmol/L (ref 0.5–1.9)
Lactic Acid, Venous: 1.5 mmol/L (ref 0.5–1.9)

## 2020-03-07 LAB — HEMOGLOBIN A1C
Hgb A1c MFr Bld: 4.6 % — ABNORMAL LOW (ref 4.8–5.6)
Mean Plasma Glucose: 85.32 mg/dL

## 2020-03-07 LAB — MAGNESIUM: Magnesium: 2.8 mg/dL — ABNORMAL HIGH (ref 1.7–2.4)

## 2020-03-07 LAB — HEPARIN LEVEL (UNFRACTIONATED)
Heparin Unfractionated: 0.15 IU/mL — ABNORMAL LOW (ref 0.30–0.70)
Heparin Unfractionated: 0.13 IU/mL — ABNORMAL LOW (ref 0.30–0.70)

## 2020-03-07 LAB — PROCALCITONIN: Procalcitonin: 12.06 ng/mL

## 2020-03-07 LAB — HEPATITIS B SURFACE ANTIGEN: Hepatitis B Surface Ag: NONREACTIVE

## 2020-03-07 MED ORDER — FENTANYL CITRATE (PF) 100 MCG/2ML IJ SOLN: 50.0000 ug | INTRAMUSCULAR | Status: DC | PRN

## 2020-03-07 MED ORDER — HEPARIN SODIUM (PORCINE) 1000 UNIT/ML IJ SOLN
INTRAMUSCULAR | Status: AC
  Administered 2020-03-07: 2800 [IU] via INTRAVENOUS
  Filled 2020-03-07: qty 4

## 2020-03-07 MED ORDER — VANCOMYCIN HCL IN DEXTROSE 1-5 GM/200ML-% IV SOLN
1000.0000 mg | Freq: Once | INTRAVENOUS | Status: AC
  Administered 2020-03-07: 1000 mg via INTRAVENOUS
  Filled 2020-03-07: qty 200

## 2020-03-07 MED ORDER — SODIUM CHLORIDE 0.9 % IV SOLN
500.0000 mg | INTRAVENOUS | Status: DC
  Filled 2020-03-07: qty 0.5

## 2020-03-07 MED ORDER — SODIUM CHLORIDE 0.9 % IV SOLN
1.0000 g | Freq: Once | INTRAVENOUS | Status: AC
  Administered 2020-03-07: 1 g via INTRAVENOUS
  Filled 2020-03-07: qty 0.03

## 2020-03-07 MED ORDER — INSULIN DETEMIR 100 UNIT/ML ~~LOC~~ SOLN
15.0000 [IU] | Freq: Two times a day (BID) | SUBCUTANEOUS | Status: DC
  Administered 2020-03-07 (×2): 15 [IU] via SUBCUTANEOUS
  Filled 2020-03-07 (×4): qty 0.15

## 2020-03-07 MED ORDER — HEPARIN SODIUM (PORCINE) 1000 UNIT/ML IJ SOLN: 1000.0000 [IU] | INTRAMUSCULAR | Status: DC | PRN

## 2020-03-07 MED ORDER — LORAZEPAM 2 MG/ML IJ SOLN: 2.0000 mg | INTRAMUSCULAR | Status: DC | PRN

## 2020-03-07 NOTE — Progress Notes (Signed)
Corey Palmer for Heparin Indication: Afib  Allergies  Allergen Reactions  . Orange Fruit Anaphylaxis, Hives and Other (See Comments)    Per Pt- Blisters around lips and Hives all over, also  . Penicillins Anaphylaxis    Did it involve swelling of the face/tongue/throat, SOB, or low BP? Yes Did it involve sudden or severe rash/hives, skin peeling, or any reaction on the inside of your mouth or nose? Yes Did you need to seek medical attention at a hospital or doctor's office? No When did it last happen?childhood If all above answers are "NO", may proceed with cephalosporin use.  Corey Palmer [Empagliflozin] Itching  . Basaglar Kwikpen [Insulin Glargine] Nausea And Vomiting    Patient Measurements: Height: 6\' 3"  (190.5 cm) Weight: 83.8 kg (184 lb 11.9 oz) IBW/kg (Calculated) : 84.5 Heparin dosing wt: 101 kg  Vital Signs: Temp: 100.3 F (37.9 C) (02/08 1612) Temp Source: Axillary (02/08 1612) BP: 118/54 (02/08 1200) Pulse Rate: 93 (02/08 1612)  Labs: Recent Labs    03/05/20 0357 03/05/20 2001 03/06/20 0455 03/06/20 1437 03/06/20 1854 03/06/20 2055 03/07/20 0332 03/07/20 1534  HGB 8.1*  --  8.4*  --  9.5*  --  8.8*  --   HCT 28.0*  --  27.8*  --  28.0*  --  28.8*  --   PLT 203  --  205  --   --   --  311  --   HEPARINUNFRC 0.27*  --  0.44 0.30  --  0.11* 0.15* 0.13*  CREATININE 0.96   < > 1.00 1.24  --   --  2.05* 1.56*   < > = values in this interval not displayed.    Estimated Creatinine Clearance: 66.4 mL/min (A) (by C-G formula based on SCr of 1.56 mg/dL (H)).   Assessment: 4 yoM with hx PAF on apixaban PTA. Pt admitted with shock and required Impella 5.5 support. CABG 12/15 with chest left open, c/b hematoma and significant bleeding. Ultimately heparin started via purge for Impella. DCCV performed 12/16, partial chest closure 1/5, Impella removed 1/21. Heparin resumed for AFib 1/22 and stopped 2/3 due to bleeding from wound  vac. Low dose heparin restarted 2/5.  Acute CVA 2/7. Discussed with neuro and at this time the risk of further cardioembolic events outweighs risk for hemorrhagic transformation.   Heparin level is subtherapeutic at 0.13, on 1400 units/hr. CBC stable this morning. No s/sx of bleeding or infusion issues. Talked with MD and plan to continue at same rate without further upward titrations.   Goal of Therapy:  Heparin level ~0.3  Monitor platelets by anticoagulation protocol: Yes   Plan:  -Continue IV heparin at 1400 units/hr. -Check heparin level in 8 hrs. -Daily heparin level and CBC, s/sx of bleeding   [name], PharmD, [place] Pharmacist  Phone: [phone] 03/07/2020 5:06 PM  Please check AMION for all [place] phone numbers After 10:00 PM, call [place] [phone]

## 2020-03-07 NOTE — Progress Notes (Addendum)
This chaplain joined the Pt., Pt. wife-[name], and 2 of the Pt. 3 children-[name] and Hailey at a bedside family meeting with the Palliative Medicine team.    The chaplain offered spiritual care and support to the Pt. family as the health care team discussed the next steps in stopping aggressive care and moving toward keeping the Pt. comfortable.  The chaplain understands [facility] announcement as "no more suffering for the Pt."  This chaplain will provide F/U family spiritual care  and will update the on-call chaplain.  The chaplain ordered a comfort cart and  added chairs for visitation.  The chaplain understands visitors may be at the bedside in intervals of three.  Assistance may be needed for virtual visitation through e-Link.    The chaplain's invitation for prayer was accepted by the family.   **1629 Spiritual care check in with Pt. wife-[name]. [name] is bedside.  [name] shares the Pt. is resting more comfortably, but "the bed shakes when he breathes." [name] also notes the Pt. "may be running a fever."  The chaplain encourages [name] to speak to the RN when she has questions.

## 2020-03-07 NOTE — Treatment Plan (Addendum)
   Low grade fever yesterday and tachypneic, cultures obtained but not started on abx.  This morning, WBC jumped from 9.7 to 19.2.  Requiring DBA 3, NE 21, co-ox 77% up from 61% yesterday. + 5lbs and +2.3L yesterday. CVP 5.  Was previously on vanco, meropenem and antifungal 1/21-1/29.  Will give a dose of vanco and mero. Check CXR, lactic and PCT. Primary team may continue abx or d/c as appropriate.  K is down to 5.5 from 5.7 after lokelma 5 overnight.  Patient is going for HD today.  May need another dose of lokelma.  Sodium down to 129 but glucose 302, corrected sodium 132. Continue to monitor sodium for now.  Increased the levemir from 12 units BID to 15 units BID (over last 24H required 72 units).

## 2020-03-07 NOTE — Progress Notes (Signed)
Patient ID: Corey Palmer, male   DOB: 1970-08-04, 50 y.o.   MRN: [id number] Medical records reviewed  This NP visited patient at bedside for continued conversation with family regarding patient's current medical situation, treatment option decisions, advanced directive decisions and anticipatory care needs.  Patient's wife/[name], son/[name] and daughter/[name] are at the bedside.  Education regarding his complex medical situation, and revisited previous conversations had with specialty teams.  Family verbalized an understanding that the patient continues to fail to thrive and that viable life prolonging medical interventions are limited.  Patient is unable to tolerate hemodialysis without pressure support, he has had a large CVA he is overall  failing to thrive.   Discussed human mortality and the limitations of medical interventions to provide quality of life when the body fails to thrive.   Family verbalize their concern for suffering and avoidance of further suffering for Corey Palmer  Education offered on the difference between aggressive medical intervention path and a palliative comfort path for this patient at this time in this situation.  After much contemplation patient's wife with the support of her 2 children have made decision to begin to shift to a more comfort path.  She still is hoping that some family members will get in to see Corey Palmer the next 24 hours.  Family understand the limited prognosis.  They are unable to shift completely to a full comfort path at this time.  They verbalize priority to be comfort and dignity.   Plan of Care:  - DNR/DNI-  Do not put patient back on vent, currently on trach collar.   - symptom management specific to dyspnea, pain and agitation ( see orders) - no escalation of care, do not escalate  blood pressor medications  - continue tube feeds, continue antibiotics and other medications as written  - no further hemodialysis - libereralize visitation,  wife is still hopeful that other family members may be able visit from out of town   Reevaluate in the morning, continue conversation with family as we begin to shift patient to a full comfort path.   Education offered on the limited prognosis and the fact that anything can happen at any time.  Questions and concerns addressed   Total time spent on the unit was 60 minutes  PMT will continue to support holistically    Discussed with Dr. Aundra Dubin and Dr. [name] and nursing team via secure chat.  Greater than 50% of the time was spent in counseling and coordination of care  Wadie Lessen NP  Palliative Medicine Team Team Phone # [phone] Pager [phone]

## 2020-03-07 NOTE — Progress Notes (Addendum)
NAME:  Corey Palmer, MRN:  [id number], DOB:  24-May-1970, LOS: 58 ADMISSION DATE:  01/03/2020, CONSULTATION DATE: 01/12/2020 REFERRING MD: Aundra Dubin CHIEF COMPLAINT: Respiratory failure post Impella  HPI/Course in hospital  50 year old man admitted 12/1 with A. fib/RVR, torsades, cardiogenic shock [EF 15] requiring Impella placement (removed 1/21), CABG x 4 c/b cardiac tamponade requiring chest reopening (now with VAC), AKI requiring on CRRT.  Past Medical History:    has a past medical history of Anginal pain ([place]), Anxiety, Arthritis, Automatic implantable cardioverter-defibrillator in situ (2007), Bipolar disorder ([place]), CAD (coronary artery disease), Cancer ([place]) (2013), CHF (congestive heart failure) ([place]), Chondrocalcinosis of right knee (9/83/3825), Chronic systolic heart failure ([place]), Depression, Diabetes uncomplicated adult-type II, Dilated cardiomyopathy ([place]), Dyslipidemia, Dysrhythmia, GERD (gastroesophageal reflux disease), Gout, unspecified, Hepatic steatosis (2011), History of kidney stones, echocardiogram, Hypertension, Obesity (BMI 30.0-34.9), Pacemaker, Paroxysmal atrial fibrillation ([place]), Peptic ulcer, Shortness of breath, and Sleep apnea.  Significant Hospital Events:  12/2 Afib with RVR. Cardioverted in ED. AICD did not fire. Milrinone for low co ox.  12/7 ICD fired, Torsades- likely from low K of 2.8.  12/8 Cath- multivessel disease w/ low CO. Volume overloaded.  12/11 Underwent Impella 5.5 insertion for persistent symptoms of forward failure with low cardiac indices. 12/12 Back to the OR for displacement of Impella. Extubated.  12/15 CABG x4, with LIMA-LAD, SVG-PDA/PLV, SVG-OM 12/16 Emergently opened chest at bedside for tamponade, clot compressing right atrium  12/16 To OR for Exploration of chest due to bleeding  12/19 Started CRRT 12/23 Sternal Closure in OR 12/24 Worsening shock.  Chest reopened at bedside with improvement in hemodynamics.  No mediastinal  hematoma. 12/25 A flutter > rapid a paced to sinus rhythm.  Remains on high-dose pressors, inotropes 12/28 Weaning vasopressor requirements.  For chest washout today.  Back in atrial fibrillation, refractory to increased amiodarone and attempted overdrive pacing.  DCCV to NSR. Tolerating fluid removal via CRRT. 1/03 Appears euvolemic.  Adjusting glycemic control.  Chest still open so not weaning 1/04 Spiking fever, Vanco meropenem resumed after culture sent from blood and sputum. 1/05 Awaiting return to the OR for wound VAC dressing assessment fever curve and white blood cell count improving after antibiotics resumed the day prior, did have chest closed, developed tamponade physiology shortly after had to go back for emergent reopening of chest, return to intensive care and refractory shock 1/06 Wound VAC dressing replaced.  Still in shock.  Starting to wean sedation 1/07 Still on high-dose pressors, CRRT 1/15 Recurrent Vtach, shocked 1/18 Attempted pressure change to P 3 with addition of Dobutamine . MAP goal > 60 1/19 Ongoing attempts at impella wean, on PS 1/20 Stable on vent, following commands with sedation weaned, continued Impella wean attempt 1/21 Impella removed, on FloTrac, WBC 22K, pancultured, Cefepime/Vanc started empirically for low SVR, concern for sepsis 1/22 antifungals added Impella removed on 1/21, 3 PM CT x3 in place 1/27 OR with plastics for debridement, ACell, partial closure 1/28 plan to stop CRRT when clots off 1/29 CRRT didn't clot off, continues on 1/31 CRRT ongoing, tolerated PSV 16/8 for several hours.  1/31 onward: vent wean, another washout, now [place] discussions 2/7 Stopped moving right side. Found to have large CVA  Consults:  PCCM, nephrology, cardiology, cardiothoracic surgery, EP  Procedures:  R PICC 12/2 >> R axillary Impella 5.5 12/11 >> 1/21 ETT 12/15 >> 1/10 R femoral arterial sheath 12/15 >> 12/31 R femoral HD cath 12/19 >> 12/31 Chest tube - right  pleural, left pleural ,  mediastinal 12/23 >> 1/5 R Radial A-Line 1/6 >> R fem HD > Chest tube - L pleural, R pleural, mediastinal 1/10 >> 1/30 Trach 1/10 > PEG 1/10 > 03/26/2020 partial closure of sternal wound with placement of wound VAC plastic surgery Significant Diagnostic Tests:  12/8 LHC  Elevated R and L heart filling pressures with low CO, severe 3-vessel disease 1/16 echo > Severe biventricular failure. No significant effusion. Pacing wire in  RV. Markedly abnormal septal motion. Micro Data:  1/19 BCx2 -no growth at 3 days 1/21 OR Cx > staph epi  Antimicrobials:  Levaquin 1/21 (periop ppx) Meropenem 1/21>1/29 Vanc 1/21 > off Anidulafungin 1/22>  1/29  Interval history:   Stopped moving right side yesterday. CT head showed large stroke involving occipital and parietal lobes.  Tolerated 2h TCT before fatiguing with significant hemodynamic compromise.   Requiring 8mcg/min of NE to tolerate iHD  Objective   Blood pressure 105/64, pulse 83, temperature 98.6 F (37 C), temperature source Axillary, resp. rate (!) 24, height 6\' 3"  (1.905 m), weight 85 kg, SpO2 100 %. CVP:  [1 mmHg-16 mmHg] 6 mmHg  Vent Mode: PRVC FiO2 (%):  [30 %-35 %] 30 % Set Rate:  [24 bmp] 24 bmp Vt Set:  [510 mL] 510 mL PEEP:  [5 cmH20] 5 cmH20 Pressure Support:  [8 cmH20] 8 cmH20 Plateau Pressure:  [16 cmH20-27 cmH20] 23 cmH20   Intake/Output Summary (Last 24 hours) at 03/07/2020 0757 Last data filed at 03/07/2020 0602 Gross per 24 hour  Intake 2986.91 ml  Output 886 ml  Net 2100.91 ml   Filed Weights   03/06/20 0600 03/07/20 0357 03/07/20 0700  Weight: 81 kg 83.4 kg 85 kg   CVP:  [1 mmHg-16 mmHg] 6 mmHg   Examination:   Constitutional: no acute distress, chronically ill appearing  Eyes: EOMI, pupils equal Ears, nose, mouth, and throat: MM dry, trach in place, with some minor pressure related injury at 6 oclock Cardiovascular: RRR, ext warm. Wound VAC in place  Respiratory: scattered  crackles, passive on vent Gastrointestinal: soft, +BS. G-tube to drainage, J-tube in place. Midline incision well healed, some induration but no erythema.  Skin: No rashes, normal turgor Neurologic: moves left UE to command. No movement on the right side.  Psychiatric: RASS -1  Assessment & Plan:   Acute hypoxic respiratory failure requiring MV, s/p trach 1/10 Possible HCAP s/p tx with meropenem/eraxis Small R pleural effusion, persistent  Open sternal wound with partial closure 03/09/2020 03/2020 increased in drainage from sternal wound site with increasing size of suspected hematoma under wound- Taken to the OR 03/24/2020 per CVTS Cardiogenic shock-s/p impella, removed 1/21, s/p CABG c/b tamponade and multiple chest re-openings, prolonged open chest , s/p excision/debridement + ACell application, partial closure with plastics 1/27 MRSE sternal wound infection Afib RVR, recurrent VT, Torsades -- improved  Acute renal failure in CKD IIIa requiring RRT Cardiorenal syndrome  Acute left hemispheric and deep white-matter stroke, likely embolic in origin. Will leave significant neurological deficits.  - Devastating stroke and has not demonstrated ability to tolerate either HD or separation from ventilator without significant hemodynamic support.  - Continue current level of support today.  -Further GOC discussion with patient's brother who will arrive from [place] today. Given devastating stroke on top of already marginal cardiac status, the outlook for good functional recovery is now dismal and a transition to comfort care is now most appropriate.   Daily Goals Checklist  Pain/Anxiety/Delirium protocol (if indicated): stop Precedex. Switch to  intermittent hydromorphone VAP protocol (if indicated): yes DVT prophylaxis:  Heparin gtt Nutrition Status: TF GI prophylaxis: Pantoprazole Glucose control: Insulin (Basal, Standing, SSI) Mobility/therapy needs: progressive mobilization as tolerated.   Code Status: Full  Family Communication: per primary-- palliative also updating  Disposition: ICU   CRITICAL CARE Performed by: Kipp Brood   Total critical care time: 40 minutes  Critical care time was exclusive of separately billable procedures and treating other patients.  Critical care was necessary to treat or prevent imminent or life-threatening deterioration.  Critical care was time spent personally by me on the following activities: development of treatment plan with patient and/or surrogate as well as nursing, discussions with consultants, evaluation of patient's response to treatment, examination of patient, obtaining history from patient or surrogate, ordering and performing treatments and interventions, ordering and review of laboratory studies, ordering and review of radiographic studies, pulse oximetry, re-evaluation of patient's condition and participation in multidisciplinary rounds.  Kipp Brood, MD [facility] ICU Physician [place]  Pager: [phone] Mobile: [phone] After hours: [phone].

## 2020-03-07 NOTE — Progress Notes (Addendum)
STROKE TEAM PROGRESS NOTE   INTERVAL HISTORY  50yo male admitted on 01/16/2020 with Afib with RVR, torsades, cardiogenic shock requiring impella placement, CABGT x4, cardiac tamponade requiring chest reopening and wound vac placement, AKI requiring CRRT, and sepsis.    The patient became altered on 03/06/20 with decreased movement on the right side and global aphasia.  He was last known normal at 0800 yesterday.  A head CT revealed a large subacute left sided posterior temporal and medial occipital lobe ischemic CVA.  Patient was not a candidate for tPA as recent surgeries less than 3 weeks ago.    His family is at the bedside at the time of our examination.  Patient opened his eyes to voice, alert and oriented to person and place, not to year.  PERRL, EOMI, + spontaneous cough, followed commands on the left side, LUE 4-/5, LLE 2/5, RUE flicker, RLE flicker.    Spoke to his wife at bedside and she stated that she will likely transition the patient to comfort care.  She is waiting for family to arrive from out-of-state.  The wife stated that she does not want the patient to suffer any further.  We will hold off on ordering MR brain for now as the patient will transition to comfort care in the next day or so.  He has a complete PCA stroke.  We would recommend continuing heparin infusion for prevention of recurrent strokes.  Neurology will sign off.  Vitals:   03/07/20 1100 03/07/20 1125 03/07/20 1200 03/07/20 1220  BP: 126/70 113/64 (!) 118/54   Pulse: 95 94 94   Resp: 20 (!) 22 (!) 23   Temp:    98.7 F (37.1 C)  TempSrc:    Oral  SpO2: 100% 100% 100%   Weight:      Height:       CBC:  Recent Labs  Lab 03/06/20 0455 03/06/20 1854 03/07/20 0332  WBC 9.7  --  19.2*  NEUTROABS 7.7  --  15.2*  HGB 8.4* 9.5* 8.8*  HCT 27.8* 28.0* 28.8*  MCV 92.1  --  90.3  PLT 205  --  277   Basic Metabolic Panel:  Recent Labs  Lab 03/06/20 0455 03/06/20 1437 03/06/20 1854 03/06/20 2055  03/07/20 0332  NA 134* 133* 134*  --  129*  K 4.6 5.2* 5.7* 5.7* 5.5*  CL 102 100  --   --  99  CO2 25 22  --   --  19*  GLUCOSE 198* 115*  --   --  309*  BUN 43* 59*  --   --  93*  CREATININE 1.00 1.24  --   --  2.05*  CALCIUM 8.2* 8.5*  --   --  8.4*  MG 2.5*  --   --   --  2.8*  PHOS 1.8* 1.8*  --   --  2.6   Lipid Panel:  Recent Labs  Lab 03/07/20 0332  CHOL 65  TRIG 56  HDL 39*  CHOLHDL 1.7  VLDL 11  LDLCALC 15   HgbA1c:  Recent Labs  Lab 03/07/20 0332  HGBA1C 4.6*   Urine Drug Screen: No results for input(s): LABOPIA, COCAINSCRNUR, LABBENZ, AMPHETMU, THCU, LABBARB in the last 168 hours.  Alcohol Level No results for input(s): ETH in the last 168 hours.  IMAGING past 24 hours DG Chest 1 View  Result Date: 03/06/2020 CLINICAL DATA:  Pneumonia EXAM: CHEST  1 VIEW COMPARISON:  03/04/2020 FINDINGS: Tracheostomy tube tip terminates  6.5 cm from the carina. Battery pack overlies left chest wall with pacer/defibrillator lead directed towards the cardiac apex. Enlarged cardiac silhouette. Right upper extremity PICC tip terminates at the superior cavoatrial junction. Telemetry leads and external support devices overlie the chest. Surgical clip seen in the right axilla. Stable cardiomegaly and cardiomediastinal contours. Some increasing right perihilar opacity with fissural and septal thickening and vascular redistribution when compared to most recent imaging. No pneumothorax. Right apical pleural thickening is similar to prior studies. No acute osseous or soft tissue abnormality. IMPRESSION: Findings are most suggestive of some increasing edematous changes/CHF with cardiomegaly, vascular congestion and developing interstitial opacity/edema. Underlying infection is difficult to exclude. Stable right apical pleural thickening. Lines and tubes as above. Electronically Signed   By: Lovena Le M.D.   On: 03/06/2020 17:56   DG CHEST PORT 1 VIEW  Result Date: 03/07/2020 CLINICAL DATA:   Leukocytosis EXAM: PORTABLE CHEST 1 VIEW COMPARISON:  March 06, 2020 and February 23, 2020 FINDINGS: Tracheostomy catheter tip is 6.1 cm above the carina. Central catheter tip is in the superior vena cava. Pacemaker lead is attached to the right ventricle. No pneumothorax. There is persistent opacity in the right apicolateral region, stable. There is ill-defined airspace opacity in the right lower lung region. Left lung is clear. There is stable cardiomegaly. The pulmonary vascularity is normal. No adenopathy. No bone lesions. IMPRESSION: Tube and catheter positions as described without pneumothorax. Persistent right apical/apicolateral opacity. Increased opacity right base, likely developing pneumonia. Stable cardiomegaly. Electronically Signed   By: Lowella Grip III M.D.   On: 03/07/2020 08:01    PHYSICAL EXAM PHYSICAL EXAM GENERAL: Awake, alert and agitated HEENT: - Normocephalic and atraumatic LUNGS - Symmetrical chest rise, No labored breathing noted CV - no JVD, No Peripheral Edema, wound vac in place ABDOMEN - Soft,  nondistended  Ext: warm, well perfused, no Peripheral edema.  NEURO Exam:   Mental Status: Easily arousable, Oriented to name and to place, not oriented to year (knods appropriately) Language: unable to assess, patient trached, mouths words appropriately  Cranial Nerves:   CN II Pupils equal and reactive to light   CN III,IV,VI EOM intact, right gaze preference, no nystagmus    CN V Unable to assess sensation in V1, V2, and V3 segments, patient not cooperative with exam   CN VII Does not smile on command    CN VIII normal hearing to speech    CN IX & X normal palatal elevation, no uvular deviation    CN XI 5/5 head turn and 5/5 shoulder shrug bilaterally    CN XII midline tongue protrusion    Motor:  RUE extremity 1/5 wiggles thumb LUE  4-/5 wiggles thumbs            RLE   2/5 wiggles toes      LLE 1/5 Tone: is normal and bulk is normal. Sensation- Intact to  light touch bilaterally. Coordination: unable to assess due to agitation Gait- deferred   ASSESSMENT/PLAN Mr. BETH SPACKMAN is a 50 y.o. male with history of CAD, automatic implantable cardioverter-defibrillator in situ (2007), congestive heart failure, anginal pain, anxiety, arthritis, bipolar disorder, cancer, chondrocalcinosis of right knee, depression, diabetes mellitus type 2, dilated cardiomyopathy, dyslipidemia, dysrhythmia, GERD, gout, hepatic steatosis, kidney stones, hypertension, obesity, proximal atrial fibrillation, peptic ulcer, obstructive sleep apnea.  presenting with right sided weakness and aphasia.   Stroke:  left complete PCA infarct embolic pattern secondary to most likely cardioembolic source  CT head  left PCA subacute infarct  CTA head & neck unable to obtain due to acute renal failure requiring dialysis and now pending comfort care  MRI/A: unable to obtain due to recent AICD placement  2D Echo: EF 25%, left ventricle has severely decreased function with wall motion abnormality  LDL 15  HgbA1c 4.6  VTE prophylaxis - heparin infusion for atrial fibrillation  aspirin 81 mg daily and Eliquis (apixaban) daily prior to admission, now on heparin IV.   Disposition:  His wife would like to transition him to comfort care after family arrives from out-of-state.  Recommend palliative care consult to help assist family with transition.     Hypertension  Unstable requiring Dobutamine gtt . Long-term BP goal normotensive  Hyperlipidemia  LDL 15, goal < 70  High intensity statin not indicated as pt is below 70  Diabetes type II Controlled  Home meds:  lantus 40 daily  HgbA1c 4.6, goal < 7.0  CBGs  Other Stroke Risk Factors  Coronary artery disease: Cardiomyopathy, CABG x4, cardiac tamponade  Obstructive sleep apnea, on CPAP at home  Congestive heart failure: EF 25%  Atrial fibrillation with RVR  Other Active Problems  AKI requiring  dialysis  Cardiac tamponade requiring chest reopening now with wound vac  Hospital day # 9  Lissy Olivencia-Simmons, ACNP-BC   ATTENDING NOTE: I reviewed above note and agree with the assessment and plan. Pt was seen and examined.   50 year old male with history of CAD, AICD placement, CHF, bipolar disorder, DM, HLD, hypertension, obesity, PAF and OSA admitted on 01/11/2020 with A. fib RVR and cardiogenic shock.  Had Impella device placement, now removed.  Also had CABG procedure complicated by cardiac tamponade which treated with open heart and wound VAC.  Patient also had AKI on CRRT.  He was found to have right-sided weakness and global aphasia on 03/06/2020.  CT showed left complete PCA territory infarct, but more subacute signal.  EF 25%, no LV thrombus, no PFO.  LDL 15 and A1c 4.6.  Creatinine 1.56 today.  Patient still on heparin IV, dobutamine, Levophed, midodrine, as well as amiodarone, Levemir and aspirin 324.  On exam, patient status post trach, not up to have speech output, however seems able to nod/shook head correctly for his name, current place and the year.  Able to follow simple commands, eyes midposition, able to gaze bilaterally, PERRL, difficult to keep eyes open, not up to check visual field.  Right facial droop.  Left upper extremity at least 4/5 against gravity, right upper extremity proximal 0/5, however able to move fingers intermittently.  Difficulty moving bilateral lower extremities due to pain, however more withdraw to pain on left than right. Sensation, coordination not corporative and gait not tested.  Patient stroke almost certain due to cardioembolic source.  He has been on heparin IV on and off due to frequent procedures.  However there is a complete left PCA infarct involving thalamic area, which making him right-sided weakness and almost for sure have hemianopia.  Given no complications from cardiac and neuro conditions, wife requested comfort care measures once  other family is coming from out of state.  I discussed with wife, patient son and daughter at bedside, and answered all their questions. Neurology will sign off. Please call with questions. hanks for the consult.   Rosalin Hawking, MD PhD Stroke Neurology 03/07/2020 4:30 PM     To contact Stroke Continuity provider, please refer to [email or web address] After hours, contact General Neurology

## 2020-03-07 NOTE — Progress Notes (Signed)
5 Days Post-Op  Subjective: Patient is laying in bed, will spontaneously open eyes and moves left extremities.   Objective: Vital signs in last 24 hours: Temp:  [98.6 F (37 C)-100.2 F (37.9 C)] 98.6 F (37 C) (02/08 1007) Pulse Rate:  [82-99] 94 (02/08 1125) Resp:  [14-57] 22 (02/08 1125) BP: (91-141)/(43-120) 113/64 (02/08 1125) SpO2:  [97 %-100 %] 100 % (02/08 1125) Arterial Line BP: (97-128)/(35-55) 128/55 (02/08 1100) FiO2 (%):  [30 %-35 %] 35 % (02/08 1125) Weight:  [83.4 kg-85 kg] 83.8 kg (02/08 1007) Last BM Date: 03/06/20  Intake/Output from previous day: 02/07 0701 - 02/08 0700 In: 3035.2 [I.V.:1310.2; NG/GT:1725] Out: 886 [Drains:205; Stool:375] Intake/Output this shift: Total I/O In: 148.5 [I.V.:98.5; IV Piggyback:50] Out: 1000 [Other:1000]  General appearance: no distress and Spontaneously opens eyes and moves left extremities Head: Normocephalic, without obvious abnormality, atraumatic, Patient on vent Chest wall: Wound VAC in place with no appreciable leaks; 50 mmHg; approximately 400 cc of serosanguineous fluid in canister  Lab Results:  CBC    Component Value Date/Time   WBC 19.2 (H) 03/07/2020 0332   RBC 3.19 (L) 03/07/2020 0332   HGB 8.8 (L) 03/07/2020 0332   HGB 16.2 03/23/2018 1535   HCT 28.8 (L) 03/07/2020 0332   HCT 47.0 03/23/2018 1535   PLT 311 03/07/2020 0332   PLT 251 03/23/2018 1535   MCV 90.3 03/07/2020 0332   MCV 82 03/23/2018 1535   MCH 27.6 03/07/2020 0332   MCHC 30.6 03/07/2020 0332   RDW 18.9 (H) 03/07/2020 0332   RDW 15.0 03/23/2018 1535   LYMPHSABS 1.9 03/07/2020 0332   LYMPHSABS 2.5 03/23/2018 1535   MONOABS 1.6 (H) 03/07/2020 0332   EOSABS 0.1 03/07/2020 0332   EOSABS 0.2 03/23/2018 1535   BASOSABS 0.1 03/07/2020 0332   BASOSABS 0.0 03/23/2018 1535   BMET Recent Labs    03/06/20 1437 03/06/20 1854 03/06/20 2055 03/07/20 0332  NA 133* 134*  --  129*  K 5.2* 5.7* 5.7* 5.5*  CL 100  --   --  99  CO2 22  --   --   19*  GLUCOSE 115*  --   --  309*  BUN 59*  --   --  93*  CREATININE 1.24  --   --  2.05*  CALCIUM 8.5*  --   --  8.4*   PT/INR No results for input(s): LABPROT, INR in the last 72 hours. ABG Recent Labs    03/06/20 1854  PHART 7.449  HCO3 22.5    Studies/Results: DG Chest 1 View  Result Date: 03/06/2020 CLINICAL DATA:  Pneumonia EXAM: CHEST  1 VIEW COMPARISON:  03/04/2020 FINDINGS: Tracheostomy tube tip terminates 6.5 cm from the carina. Battery pack overlies left chest wall with pacer/defibrillator lead directed towards the cardiac apex. Enlarged cardiac silhouette. Right upper extremity PICC tip terminates at the superior cavoatrial junction. Telemetry leads and external support devices overlie the chest. Surgical clip seen in the right axilla. Stable cardiomegaly and cardiomediastinal contours. Some increasing right perihilar opacity with fissural and septal thickening and vascular redistribution when compared to most recent imaging. No pneumothorax. Right apical pleural thickening is similar to prior studies. No acute osseous or soft tissue abnormality. IMPRESSION: Findings are most suggestive of some increasing edematous changes/CHF with cardiomegaly, vascular congestion and developing interstitial opacity/edema. Underlying infection is difficult to exclude. Stable right apical pleural thickening. Lines and tubes as above. Electronically Signed   By: Lovena Le M.D.   On:  03/06/2020 17:56   DG Abd 1 View  Result Date: 03/06/2020 CLINICAL DATA:  50 year old male with history of atrial fibrillation, RVR, tore sides, cardiogenic shock, CABG. Peg tube placed last month. EXAM: ABDOMEN - 1 VIEW COMPARISON:  Portable abdomen 02/28/2020 and earlier. FINDINGS: Portable AP supine view at 0325 hours. Partially visible cardiac AICD. Visible lung bases appear negative. There are 2 left side percutaneous enteric tubes, the lower tube appears to be a J-tube based on CT Abdomen and Pelvis 02/12/2020.  Stable cholecystectomy clips. Visualized bowel gas pattern is non obstructed. No acute osseous abnormality identified. IMPRESSION: G-tube and J-tube in place with non obstructed bowel gas pattern. Electronically Signed   By: Genevie Ann M.D.   On: 03/06/2020 04:40   CT HEAD WO CONTRAST  Result Date: 03/06/2020 CLINICAL DATA:  Neurologic deficit EXAM: CT HEAD WITHOUT CONTRAST TECHNIQUE: Contiguous axial images were obtained from the base of the skull through the vertex without intravenous contrast. COMPARISON:  July 20, 2011 FINDINGS: Brain: Ventricles and sulci are normal in size and configuration. There is decreased attenuation throughout the medial and mid left occipital lobe regions as well as throughout much of the posterior left temporal lobe. Decreased attenuation is also noted throughout a portion of the posterior limb of the left internal capsule as well as the mid to lateral left thalamus. These areas are felt to represent extensive acute infarct involving much of the left posterior cerebral artery distribution. Note that there is relative effacement of a portion of the atrium of the left lateral ventricle due to edema from this apparent acute infarct. Elsewhere, there is mild periventricular small vessel disease. There is no appreciable mass. There is no evident hemorrhage, extra-axial fluid collection, or midline shift. Vascular: There is no hyperdense vessel. Calcification is noted in each carotid siphon region an each distal vertebral artery. Skull: The bony calvarium appears intact. Sinuses/Orbits: There is opacification in a left ethmoid air cell. Other visualized paranasal sinuses are clear. Orbits appear symmetric bilaterally. Other: Opacification is noted in multiple mastoid air cells bilaterally. IMPRESSION: 1. Apparent acute infarct involving portions of the left posterior temporal and medial to mid left occipital lobes as well as the posterior limb of the left internal capsule and much of the mid  to lateral left thalamus. This large infarct is causing significant effacement of the atrium of the left lateral ventricle. 2.  No mass or hemorrhage evident.  No midline shift. 3.  There is periventricular small vessel disease. 4.  Foci of arterial vascular calcification noted. 5. Foci of mastoid air cell opacification bilaterally. Opacification also noted in a midportion left ethmoid air cell. These results will be called to the ordering clinician or representative by the Radiologist Assistant, and communication documented in the PACS or [employer]. 2.  There is mild periventricular small vessel disease. Electronically Signed   By: Lowella Grip III M.D.   On: 03/06/2020 13:52   DG CHEST PORT 1 VIEW  Result Date: 03/07/2020 CLINICAL DATA:  Leukocytosis EXAM: PORTABLE CHEST 1 VIEW COMPARISON:  March 06, 2020 and February 23, 2020 FINDINGS: Tracheostomy catheter tip is 6.1 cm above the carina. Central catheter tip is in the superior vena cava. Pacemaker lead is attached to the right ventricle. No pneumothorax. There is persistent opacity in the right apicolateral region, stable. There is ill-defined airspace opacity in the right lower lung region. Left lung is clear. There is stable cardiomegaly. The pulmonary vascularity is normal. No adenopathy. No bone lesions. IMPRESSION: Tube  and catheter positions as described without pneumothorax. Persistent right apical/apicolateral opacity. Increased opacity right base, likely developing pneumonia. Stable cardiomegaly. Electronically Signed   By: Lowella Grip III M.D.   On: 03/07/2020 08:01   ECHOCARDIOGRAM COMPLETE  Result Date: 03/06/2020    ECHOCARDIOGRAM REPORT   Patient Name:   OVA MEEGAN Date of Exam: 03/06/2020 Medical Rec #:  [id number]     Height:       75.0 in Accession #:    [id number]    Weight:       178.6 lb Date of Birth:  1970-10-06     BSA:          2.091 m Patient Age:    36 years      BP:           90/59 mmHg Patient Gender: M              HR:           97 bpm. Exam Location:  Inpatient Procedure: 2D Echo, Cardiac Doppler, Color Doppler and Intracardiac            Opacification Agent Indications:    CHF-Acute Systolic C16.60  History:        Patient has prior history of Echocardiogram examinations, most                 recent 01/30/2020. Cardiomyopathy, CAD, Arrythmias:Atrial                 Fibrillation; Risk Factors:Hypertension, Diabetes, Dyslipidemia                 and Sleep Apnea. GERD.  Sonographer:    Vickie Epley RDCS Referring Phys: [place] Comments: Echo performed with patient supine and on artificial respirator. IMPRESSIONS  1. Left ventricular ejection fraction, by estimation, is 25%. The left ventricle has severely decreased function. The left ventricle demonstrates regional wall motion abnormalities, global hypokinesis with septal dyskinesis. The left ventricular internal cavity size was moderately dilated. Left ventricular diastolic parameters are indeterminate. No LV thrombus was noted.  2. Right ventricular systolic function is moderately reduced. The right ventricular size is normal. Tricuspid regurgitation signal is inadequate for assessing PA pressure.  3. Left atrial size was mildly dilated.  4. The mitral valve is normal in structure. Trivial mitral valve regurgitation. No evidence of mitral stenosis.  5. The aortic valve is tricuspid. Aortic valve regurgitation is not visualized. No aortic stenosis is present.  6. The inferior vena cava is normal in size with <50% respiratory variability, suggesting right atrial pressure of 8 mmHg. FINDINGS  Left Ventricle: Left ventricular ejection fraction, by estimation, is 25%. The left ventricle has severely decreased function. The left ventricle demonstrates regional wall motion abnormalities. Definity contrast agent was given IV to delineate the left  ventricular endocardial borders. The left ventricular internal cavity size was moderately dilated. There is  no left ventricular hypertrophy. Left ventricular diastolic parameters are indeterminate. Right Ventricle: The right ventricular size is normal. No increase in right ventricular wall thickness. Right ventricular systolic function is moderately reduced. Tricuspid regurgitation signal is inadequate for assessing PA pressure. Left Atrium: Left atrial size was mildly dilated. Right Atrium: Right atrial size was normal in size. Pericardium: There is no evidence of pericardial effusion. Mitral Valve: The mitral valve is normal in structure. Trivial mitral valve regurgitation. No evidence of mitral valve stenosis. Tricuspid Valve: The tricuspid valve is normal in structure. Tricuspid valve regurgitation  is trivial. Aortic Valve: The aortic valve is tricuspid. Aortic valve regurgitation is not visualized. No aortic stenosis is present. Pulmonic Valve: The pulmonic valve was normal in structure. Pulmonic valve regurgitation is not visualized. Aorta: The aortic root is normal in size and structure. Venous: The inferior vena cava is normal in size with less than 50% respiratory variability, suggesting right atrial pressure of 8 mmHg. IAS/Shunts: No atrial level shunt detected by color flow Doppler. Additional Comments: A pacer wire is visualized in the right ventricle.  LEFT VENTRICLE PLAX 2D LVIDd:         6.40 cm LVIDs:         5.50 cm LV PW:         1.10 cm LV IVS:        1.10 cm LVOT diam:     2.40 cm LV SV:         77 LV SV Index:   37 LVOT Area:     4.52 cm  RIGHT VENTRICLE TAPSE (M-mode): 0.7 cm LEFT ATRIUM             Index       RIGHT ATRIUM           Index LA diam:        4.30 cm 2.06 cm/m  RA Area:     16.00 cm LA Vol (A2C):   63.2 ml 30.22 ml/m RA Volume:   42.10 ml  20.13 ml/m LA Vol (A4C):   66.8 ml 31.94 ml/m LA Biplane Vol: 70.4 ml 33.66 ml/m  AORTIC VALVE LVOT Vmax:   109.00 cm/s LVOT Vmean:  71.700 cm/s LVOT VTI:    0.170 m  AORTA Ao Root diam: 3.40 cm MR Peak grad: 66.9 mmHg MR Vmax:      409.00 cm/s  SHUNTS                           Systemic VTI:  0.17 m                           Systemic Diam: 2.40 cm Loralie Champagne MD Electronically signed by Loralie Champagne MD Signature Date/Time: 03/06/2020/4:42:53 PM    Final     Anti-infectives: Anti-infectives (From admission, onward)   Start     Dose/Rate Route Frequency Ordered Stop   03-28-20 2200  meropenem (MERREM) 500 mg in sodium chloride 0.9 % 100 mL IVPB        500 mg 200 mL/hr over 30 Minutes Intravenous Every 24 hours 03/07/20 0845     03/07/20 1200  vancomycin (VANCOCIN) IVPB 1000 mg/200 mL premix        1,000 mg 200 mL/hr over 60 Minutes Intravenous  Once 03/07/20 0845     03/07/20 0645  vancomycin (VANCOCIN) IVPB 1000 mg/200 mL premix        1,000 mg 200 mL/hr over 60 Minutes Intravenous  Once 03/07/20 0547 03/07/20 0702   03/07/20 0645  meropenem (MERREM) 1 g in sodium chloride 0.9 % 100 mL IVPB        1 g 200 mL/hr over 30 Minutes Intravenous  Once 03/07/20 0547 03/07/20 1112   03/05/2020 1330  ceFAZolin 1 g / gentamicin 80 mg in NS 500 mL surgical irrigation  Status:  Discontinued         Irrigation To Surgery 03/10/2020 1317 03/26/2020 1340   02/20/20 2000  anidulafungin (ERAXIS) 100 mg  in sodium chloride 0.9 % 100 mL IVPB        100 mg 78 mL/hr over 100 Minutes Intravenous Every 24 hours 02/19/20 1425 02/25/20 2125   02/19/20 1600  vancomycin (VANCOCIN) IVPB 1000 mg/200 mL premix        1,000 mg 200 mL/hr over 60 Minutes Intravenous Every 24 hours 02/19/20 1056 02/28/20 1725   02/19/20 1600  anidulafungin (ERAXIS) 200 mg in sodium chloride 0.9 % 200 mL IVPB        200 mg 78 mL/hr over 200 Minutes Intravenous  Once 02/19/20 1425 02/19/20 2045   02/19/20 1400  vancomycin (VANCOCIN) IVPB 1000 mg/200 mL premix  Status:  Discontinued        1,000 mg 200 mL/hr over 60 Minutes Intravenous Every 24 hours 02/14/2020 1312 02/28/2020 1633   02/19/20 1400  meropenem (MERREM) 1 g in sodium chloride 0.9 % 100 mL IVPB        1 g 200 mL/hr over 30  Minutes Intravenous Every 8 hours 02/19/20 1056 02/25/20 2211   02/16/2020 1800  meropenem (MERREM) 1 g in sodium chloride 0.9 % 100 mL IVPB  Status:  Discontinued        1 g 200 mL/hr over 30 Minutes Intravenous Every 12 hours 02/07/2020 1620 02/19/20 1056   02/07/2020 1400  ceFEPIme (MAXIPIME) 2 g in sodium chloride 0.9 % 100 mL IVPB  Status:  Discontinued        2 g 200 mL/hr over 30 Minutes Intravenous Every 12 hours 02/13/2020 1312 02/10/2020 1620   02/15/2020 1400  vancomycin (VANCOREADY) IVPB 2000 mg/400 mL        2,000 mg 200 mL/hr over 120 Minutes Intravenous  Once 02/12/2020 1312 02/02/2020 1827   02/01/2020 0859  vancomycin (VANCOCIN) 1,000 mg in sodium chloride 0.9 % 1,000 mL irrigation  Status:  Discontinued          As needed 02/05/2020 0859 02/01/2020 0927   02/16/2020 0800  levofloxacin (LEVAQUIN) IVPB 500 mg        500 mg 100 mL/hr over 60 Minutes Intravenous To Surgery 02/05/2020 0758 02/05/2020 0905   02/08/2020 1115  vancomycin (VANCOREADY) IVPB 1500 mg/300 mL  Status:  Discontinued        1,500 mg 150 mL/hr over 120 Minutes Intravenous NOW 02/01/2020 1103 02/04/2020 1245   02/08/2020 1400  vancomycin (VANCOCIN) IVPB 1000 mg/200 mL premix  Status:  Discontinued        1,000 mg 200 mL/hr over 60 Minutes Intravenous Every 24 hours 02/01/20 1011 02/09/20 0957   02/01/20 1400  meropenem (MERREM) 1 g in sodium chloride 0.9 % 100 mL IVPB  Status:  Discontinued        1 g 200 mL/hr over 30 Minutes Intravenous Every 8 hours 02/01/20 1011 02/09/20 0957   02/01/20 1100  vancomycin (VANCOREADY) IVPB 2000 mg/400 mL        2,000 mg 200 mL/hr over 120 Minutes Intravenous  Once 02/01/20 1011 02/01/20 1327   01/28/20 1300  vancomycin (VANCOCIN) IVPB 1000 mg/200 mL premix  Status:  Discontinued        1,000 mg 200 mL/hr over 60 Minutes Intravenous Every 24 hours 01/27/20 1430 01/28/20 0830   01/24/20 1300  vancomycin (VANCOCIN) IVPB 1000 mg/200 mL premix  Status:  Discontinued        1,000 mg 200 mL/hr over 60  Minutes Intravenous Every 24 hours 01/24/20 0846 01/27/20 1430   01/22/20 1601  vancomycin variable dose per unstable  renal function (pharmacist dosing)  Status:  Discontinued         Does not apply See admin instructions 01/22/20 1601 01/24/20 0846   01/21/20 1200  vancomycin (VANCOREADY) IVPB 1250 mg/250 mL  Status:  Discontinued        1,250 mg 166.7 mL/hr over 90 Minutes Intravenous Every 24 hours 01/16/2020 1620 01/22/20 1457   01/13/2020 1800  meropenem (MERREM) 1 g in sodium chloride 0.9 % 100 mL IVPB  Status:  Discontinued        1 g 200 mL/hr over 30 Minutes Intravenous Every 8 hours 01/27/2020 1800 01/28/20 0830   01/12/2020 1348  vancomycin (VANCOCIN) 1,000 mg in sodium chloride 0.9 % 1,000 mL irrigation  Status:  Discontinued          As needed 01/09/2020 1349 01/16/2020 1611   01/23/2020 1015  vancomycin (VANCOREADY) IVPB 1500 mg/300 mL        1,500 mg 150 mL/hr over 120 Minutes Intravenous On call to O.R. 01/19/2020 0927 01/12/2020 1556   01/19/20 1700  vancomycin (VANCOREADY) IVPB 1250 mg/250 mL  Status:  Discontinued        1,250 mg 166.7 mL/hr over 90 Minutes Intravenous Every 24 hours 01/18/20 1618 01/23/2020 1620   01/16/20 1700  vancomycin (VANCOREADY) IVPB 1500 mg/300 mL  Status:  Discontinued        1,500 mg 150 mL/hr over 120 Minutes Intravenous Every 24 hours 01/16/20 1121 01/18/20 1618   01/15/20 1700  vancomycin (VANCOREADY) IVPB 750 mg/150 mL  Status:  Discontinued        750 mg 150 mL/hr over 60 Minutes Intravenous Every 12 hours 01/15/20 1107 01/16/20 1121   01/12/2020 1535  vancomycin (VANCOCIN) powder  Status:  Discontinued          As needed 01/05/2020 1536 01/21/2020 1619   01/08/2020 1400  meropenem (MERREM) 1 g in sodium chloride 0.9 % 100 mL IVPB  Status:  Discontinued        1 g 200 mL/hr over 30 Minutes Intravenous Every 8 hours 01/04/2020 1007 01/15/2020 1800   01/23/2020 1400  levofloxacin (LEVAQUIN) IVPB 500 mg  Status:  Discontinued        500 mg 100 mL/hr over 60 Minutes  Intravenous To Surgery 12/29/2019 1354 01/12/2020 1619   12/29/2019 1345  vancomycin (VANCOREADY) IVPB 1500 mg/300 mL  Status:  Discontinued        1,500 mg 150 mL/hr over 120 Minutes Intravenous To Surgery 01/23/2020 1340 12/29/2019 1619   01/21/2020 1345  cefUROXime (ZINACEF) 1.5 g in sodium chloride 0.9 % 100 mL IVPB  Status:  Discontinued        1.5 g 200 mL/hr over 30 Minutes Intravenous To Surgery 01/07/2020 1340 01/06/2020 1353   01/16/2020 1345  cefUROXime (ZINACEF) 750 mg in sodium chloride 0.9 % 100 mL IVPB  Status:  Discontinued        750 mg 200 mL/hr over 30 Minutes Intravenous To Surgery 01/11/2020 1340 01/06/2020 1353   01/16/2020 1002  vancomycin (VANCOCIN) IVPB 1000 mg/200 mL premix  Status:  Discontinued        1,000 mg 200 mL/hr over 60 Minutes Intravenous Every 12 hours 01/04/2020 0948 01/15/20 1049   01/14/2020 1000  levofloxacin (LEVAQUIN) IVPB 750 mg        750 mg 100 mL/hr over 90 Minutes Intravenous Every 24 hours 01/21/2020 1512 01/09/2020 1900   01/22/2020 2130  vancomycin (VANCOCIN) IVPB 1000 mg/200 mL premix  1,000 mg 200 mL/hr over 60 Minutes Intravenous  Once 12/31/2019 1512 01/01/2020 1718   01/05/2020 1018  vancomycin (VANCOCIN) powder  Status:  Discontinued          As needed 01/15/2020 1019 01/26/2020 1511   12/29/2019 0400  vancomycin (VANCOREADY) IVPB 1500 mg/300 mL        1,500 mg 150 mL/hr over 120 Minutes Intravenous To Surgery 01/11/20 1357 12/30/2019 1100   01/27/2020 0400  levofloxacin (LEVAQUIN) IVPB 500 mg        500 mg 100 mL/hr over 60 Minutes Intravenous To Surgery 01/11/20 1357 01/15/2020 1000      Assessment/Plan: s/p Procedure(s): STERNAL WOUND DEBRIDEMENT WOUND EXPLORATION APPLICATION OF WOUND VAC Wound VAC is in place with no appreciable leaks. No signs of infection. 50 mmHg, 400 cc of serosanguineous fluid in canister.   Will discuss options for wound VAC change with Cardiology and Care team given patient's current condition and recent stroke.   LOS: 68 days    Threasa Heads, [place] 03/07/2020

## 2020-03-07 NOTE — Progress Notes (Signed)
Ice applied to axillary and groin area in attempt to reduce fever.

## 2020-03-07 NOTE — Progress Notes (Signed)
[place] for Heparin Indication: Afib  Allergies  Allergen Reactions  . Orange Fruit Anaphylaxis, Hives and Other (See Comments)    Per Pt- Blisters around lips and Hives all over, also  . Penicillins Anaphylaxis    Did it involve swelling of the face/tongue/throat, SOB, or low BP? Yes Did it involve sudden or severe rash/hives, skin peeling, or any reaction on the inside of your mouth or nose? Yes Did you need to seek medical attention at a hospital or doctor's office? No When did it last happen?childhood If all above answers are "NO", may proceed with cephalosporin use.  [name] [Empagliflozin] Itching  . Basaglar Kwikpen [Insulin Glargine] Nausea And Vomiting    Patient Measurements: Height: 6\' 3"  (190.5 cm) Weight: 85 kg (187 lb 6.3 oz) IBW/kg (Calculated) : 84.5 Heparin dosing wt: 101 kg  Vital Signs: Temp: 98.6 F (37 C) (02/08 0700) Temp Source: Axillary (02/08 0700) BP: 108/59 (02/08 0730) Pulse Rate: 83 (02/08 0645)  Labs: Recent Labs    03/05/20 0357 03/05/20 2001 03/06/20 0455 03/06/20 1437 03/06/20 1854 03/06/20 2055 03/07/20 0332  HGB 8.1*  --  8.4*  --  9.5*  --  8.8*  HCT 28.0*  --  27.8*  --  28.0*  --  28.8*  PLT 203  --  205  --   --   --  311  HEPARINUNFRC 0.27*  --  0.44 0.30  --  0.11* 0.15*  CREATININE 0.96   < > 1.00 1.24  --   --  2.05*   < > = values in this interval not displayed.    Estimated Creatinine Clearance: 51 mL/min (A) (by C-G formula based on SCr of 2.05 mg/dL (H)).   Assessment: 71 yoM with hx PAF on apixaban PTA. Pt admitted with shock and required Impella 5.5 support. CABG 12/15 with chest left open, c/b hematoma and significant bleeding. Ultimately heparin started via purge for Impella. DCCV performed 12/16, partial chest closure 1/5, Impella removed 1/21. Heparin resumed for AFib 1/22 and stopped 2/3 due to bleeding from wound vac. Low dose heparin restarted 2/5.   Acute CVA  2/7. Discussed with neuro and at this time the risk of further cardioembolic events outweighs risk for hemorrhagic transformation.   Heparin level low this AM (0.15).  Currently getting HD.  No overt bleeding or complications noted.  Goal of Therapy:  Heparin level ~0.3  Monitor platelets by anticoagulation protocol: Yes   Plan:  -Increase IV heparin to 1400 units/hr. -Check heparin level in 8 hrs. -Daily heparin level and CBC, s/sx of bleeding   [name], [name] Reason, [facility] Clinical Pharmacist  03/07/2020 7:41 AM   [facility] pharmacy phone numbers are listed on [email or web address]

## 2020-03-07 NOTE — Progress Notes (Addendum)
Patient ID: Corey Palmer, male   DOB: [birth year], 50 y.o.   MRN: [id number]     Advanced Heart Failure Rounding Note  PCP-Cardiologist: No primary care provider on file.   Subjective:    - 12/7 Torsades -ICD shock x1.  - 12/11 Impella 5.5 placed.  - 12/12 Impella repositioned in OR - 12/15 CABG x 4 with LIMA-LAD, SVG-PDA/PLV, SVG-OM - 12/16 DCCV afib.  Back to OR to re-open chest and again to evacuate hematoma.  - 12/19 CVVH initiated  - 12/23 Chest closed - 12/24 Chest reopened - 12/26 RAP back into NSR - 12/27 back in atrial fibrillation, unable to rapid atrial pace out.   - 12/28 To OR, chest partially closed and wound vac placed.  DCCV to NSR.  - 12/31 Antibiotics stopped. Swan removed, HD catheter moved from right femoral to right IJ.  - 1/5 To OR for chest closure. Chest reopened emergently at bedside again to evacuate hematoma  - 1/10 To OR: omental flap closure of chest with wound vac, tracheostomy, G-tube, J-tube.    - 1/12 Plastics and EP consulted.  - 1/13 Palliative Care consulted.  - 1/15 Went into VT, Shock 200 J and amiodarone bolus 150.   - 1/16: Milrinone stopped.    - 1/17: Partial closure of chest in OR - 1/18 Switched to dobutamine 4 mcg - 1/21 Impella Extracted.  Developed hyperkalemia when CVVH held with wide QRS rhythm and required transient external pacing.   - 1/27 OR for wound vac change, partial chest wall closure.  - 2/2 S/P sternal debridement , closure part of sternal wound, A cell  - 2/3 Return to OR for re-exploration of sternal wound due to bleeding.  - 2/7 CVVH discontinued  - 2/7 Head CT showed acute large left-sided posterior temporal and medial occipital lobe ischemic CVA.  Found to have subacute large left PCA territory stroke yesterday, felt cardioembolic. Neuro following. Remains on IV heparin.  Likely early sepsis. Febrile overnight. mTep 100.2. WBC 9>>19K. Hypotension requiring NE. Cultures sent. Given dose of vanc + meropenem x 1.    Plan is for trial of iHD today but currently requiring high NE for BP support.  CVP 6.  On NE 19 + DBA 3. Co-ox 77%   Scr 2.05 BUN 93 K 5.5 Na 129      Objective:   Weight Range: 85 kg Body mass index is 23.42 kg/m.   Vital Signs:   Temp:  [97.1 F (36.2 C)-100.2 F (37.9 C)] 98.6 F (37 C) (02/08 0700) Pulse Rate:  [79-99] 83 (02/08 0645) Resp:  [12-57] 28 (02/08 0700) BP: (90-141)/(43-120) 106/59 (02/08 0700) SpO2:  [97 %-100 %] 100 % (02/08 0700) Arterial Line BP: (97-124)/(35-57) 111/49 (02/08 0645) FiO2 (%):  [30 %-35 %] 30 % (02/08 0334) Weight:  [83.4 kg-85 kg] 85 kg (02/08 0700) Last BM Date: 03/06/20  Weight change: Filed Weights   03/06/20 0600 03/07/20 0357 03/07/20 0700  Weight: 81 kg 83.4 kg 85 kg    Intake/Output:   Intake/Output Summary (Last 24 hours) at 03/07/2020 0716 Last data filed at 03/07/2020 0602 Gross per 24 hour  Intake 2986.91 ml  Output 886 ml  Net 2100.91 ml      Physical Exam   General:  Chronically ill appearing. No respiratory difficulty HEENT: normal Neck: supple. no JVD. + TC Carotids 2+ bilat; no bruits. No lymphadenopathy or thyromegaly appreciated. Cor: PMI nondisplaced. Regular rate & rhythm. No rubs, gallops or murmurs. + Sternal wound  vac Lungs: clear Abdomen: soft, nontender, nondistended. No hepatosplenomegaly. No bruits or masses. Good bowel sounds. Extremities: no cyanosis, clubbing, rash, trace bilateral LE edema Neuro: will awaken, limited movement of right side    Telemetry   NSR 80s.  Personally reviewed.   Labs    CBC Recent Labs    03/06/20 0455 03/06/20 1854 03/07/20 0332  WBC 9.7  --  19.2*  NEUTROABS 7.7  --  15.2*  HGB 8.4* 9.5* 8.8*  HCT 27.8* 28.0* 28.8*  MCV 92.1  --  90.3  PLT 205  --  048   Basic Metabolic Panel Recent Labs    03/06/20 0455 03/06/20 1437 03/06/20 1854 03/06/20 2055 03/07/20 0332  NA 134* 133* 134*  --  129*  K 4.6 5.2* 5.7* 5.7* 5.5*  CL 102 100  --   --   99  CO2 25 22  --   --  19*  GLUCOSE 198* 115*  --   --  309*  BUN 43* 59*  --   --  93*  CREATININE 1.00 1.24  --   --  2.05*  CALCIUM 8.2* 8.5*  --   --  8.4*  MG 2.5*  --   --   --  2.8*  PHOS 1.8* 1.8*  --   --  2.6   Liver Function Tests Recent Labs    03/06/20 1437 03/07/20 0332  ALBUMIN 2.2* 2.0*   No results for input(s): LIPASE, AMYLASE in the last 72 hours. Cardiac Enzymes No results for input(s): CKTOTAL, CKMB, CKMBINDEX, TROPONINI in the last 72 hours.  BNP: BNP (last 3 results) Recent Labs    01/07/2020 1619  BNP 577.5*    ProBNP (last 3 results) No results for input(s): PROBNP in the last 8760 hours.   D-Dimer No results for input(s): DDIMER in the last 72 hours. Hemoglobin A1C Recent Labs    03/07/20 0332  HGBA1C 4.6*   Fasting Lipid Panel Recent Labs    03/07/20 0332  CHOL 65  HDL 39*  LDLCALC 15  TRIG 56  CHOLHDL 1.7   Thyroid Function Tests No results for input(s): TSH, T4TOTAL, T3FREE, THYROIDAB in the last 72 hours.  Invalid input(s): FREET3  Other results:   Imaging    DG Chest 1 View  Result Date: 03/06/2020 CLINICAL DATA:  Pneumonia EXAM: CHEST  1 VIEW COMPARISON:  03/04/2020 FINDINGS: Tracheostomy tube tip terminates 6.5 cm from the carina. Battery pack overlies left chest wall with pacer/defibrillator lead directed towards the cardiac apex. Enlarged cardiac silhouette. Right upper extremity PICC tip terminates at the superior cavoatrial junction. Telemetry leads and external support devices overlie the chest. Surgical clip seen in the right axilla. Stable cardiomegaly and cardiomediastinal contours. Some increasing right perihilar opacity with fissural and septal thickening and vascular redistribution when compared to most recent imaging. No pneumothorax. Right apical pleural thickening is similar to prior studies. No acute osseous or soft tissue abnormality. IMPRESSION: Findings are most suggestive of some increasing edematous  changes/CHF with cardiomegaly, vascular congestion and developing interstitial opacity/edema. Underlying infection is difficult to exclude. Stable right apical pleural thickening. Lines and tubes as above. Electronically Signed   By: Lovena Le M.D.   On: 03/06/2020 17:56   CT HEAD WO CONTRAST  Result Date: 03/06/2020 CLINICAL DATA:  Neurologic deficit EXAM: CT HEAD WITHOUT CONTRAST TECHNIQUE: Contiguous axial images were obtained from the base of the skull through the vertex without intravenous contrast. COMPARISON:  July 20, 2011 FINDINGS: Brain: Ventricles  and sulci are normal in size and configuration. There is decreased attenuation throughout the medial and mid left occipital lobe regions as well as throughout much of the posterior left temporal lobe. Decreased attenuation is also noted throughout a portion of the posterior limb of the left internal capsule as well as the mid to lateral left thalamus. These areas are felt to represent extensive acute infarct involving much of the left posterior cerebral artery distribution. Note that there is relative effacement of a portion of the atrium of the left lateral ventricle due to edema from this apparent acute infarct. Elsewhere, there is mild periventricular small vessel disease. There is no appreciable mass. There is no evident hemorrhage, extra-axial fluid collection, or midline shift. Vascular: There is no hyperdense vessel. Calcification is noted in each carotid siphon region an each distal vertebral artery. Skull: The bony calvarium appears intact. Sinuses/Orbits: There is opacification in a left ethmoid air cell. Other visualized paranasal sinuses are clear. Orbits appear symmetric bilaterally. Other: Opacification is noted in multiple mastoid air cells bilaterally. IMPRESSION: 1. Apparent acute infarct involving portions of the left posterior temporal and medial to mid left occipital lobes as well as the posterior limb of the left internal capsule and  much of the mid to lateral left thalamus. This large infarct is causing significant effacement of the atrium of the left lateral ventricle. 2.  No mass or hemorrhage evident.  No midline shift. 3.  There is periventricular small vessel disease. 4.  Foci of arterial vascular calcification noted. 5. Foci of mastoid air cell opacification bilaterally. Opacification also noted in a midportion left ethmoid air cell. These results will be called to the ordering clinician or representative by the Radiologist Assistant, and communication documented in the PACS or [employer]. 2.  There is mild periventricular small vessel disease. Electronically Signed   By: Lowella Grip III M.D.   On: 03/06/2020 13:52   ECHOCARDIOGRAM COMPLETE  Result Date: 03/06/2020    ECHOCARDIOGRAM REPORT   [name] Date of Exam: 03/06/2020 Medical Rec #:  [id number]     Height:       75.0 in Accession #:    [id number]    Weight:       178.6 lb Date of Birth:  21-Aug-1970     BSA:          2.091 m Patient Age:    67 years      BP:           90/59 mmHg Patient Gender: M             HR:           97 bpm. Exam Location:  Inpatient Procedure: 2D Echo, Cardiac Doppler, Color Doppler and Intracardiac            Opacification Agent Indications:    CHF-Acute Systolic T36.46  History:        Patient has prior history of Echocardiogram examinations, most                 recent 02/12/2020. Cardiomyopathy, CAD, Arrythmias:Atrial                 Fibrillation; Risk Factors:Hypertension, Diabetes, Dyslipidemia                 and Sleep Apnea. GERD.  Sonographer:    Vickie Epley RDCS Referring Phys: [place] Comments: Echo performed with patient supine and on  artificial respirator. IMPRESSIONS  1. Left ventricular ejection fraction, by estimation, is 25%. The left ventricle has severely decreased function. The left ventricle demonstrates regional wall motion abnormalities, global hypokinesis with septal dyskinesis.  The left ventricular internal cavity size was moderately dilated. Left ventricular diastolic parameters are indeterminate. No LV thrombus was noted.  2. Right ventricular systolic function is moderately reduced. The right ventricular size is normal. Tricuspid regurgitation signal is inadequate for assessing PA pressure.  3. Left atrial size was mildly dilated.  4. The mitral valve is normal in structure. Trivial mitral valve regurgitation. No evidence of mitral stenosis.  5. The aortic valve is tricuspid. Aortic valve regurgitation is not visualized. No aortic stenosis is present.  6. The inferior vena cava is normal in size with <50% respiratory variability, suggesting right atrial pressure of 8 mmHg. FINDINGS  Left Ventricle: Left ventricular ejection fraction, by estimation, is 25%. The left ventricle has severely decreased function. The left ventricle demonstrates regional wall motion abnormalities. Definity contrast agent was given IV to delineate the left  ventricular endocardial borders. The left ventricular internal cavity size was moderately dilated. There is no left ventricular hypertrophy. Left ventricular diastolic parameters are indeterminate. Right Ventricle: The right ventricular size is normal. No increase in right ventricular wall thickness. Right ventricular systolic function is moderately reduced. Tricuspid regurgitation signal is inadequate for assessing PA pressure. Left Atrium: Left atrial size was mildly dilated. Right Atrium: Right atrial size was normal in size. Pericardium: There is no evidence of pericardial effusion. Mitral Valve: The mitral valve is normal in structure. Trivial mitral valve regurgitation. No evidence of mitral valve stenosis. Tricuspid Valve: The tricuspid valve is normal in structure. Tricuspid valve regurgitation is trivial. Aortic Valve: The aortic valve is tricuspid. Aortic valve regurgitation is not visualized. No aortic stenosis is present. Pulmonic Valve: The  pulmonic valve was normal in structure. Pulmonic valve regurgitation is not visualized. Aorta: The aortic root is normal in size and structure. Venous: The inferior vena cava is normal in size with less than 50% respiratory variability, suggesting right atrial pressure of 8 mmHg. IAS/Shunts: No atrial level shunt detected by color flow Doppler. Additional Comments: A pacer wire is visualized in the right ventricle.  LEFT VENTRICLE PLAX 2D LVIDd:         6.40 cm LVIDs:         5.50 cm LV PW:         1.10 cm LV IVS:        1.10 cm LVOT diam:     2.40 cm LV SV:         77 LV SV Index:   37 LVOT Area:     4.52 cm  RIGHT VENTRICLE TAPSE (M-mode): 0.7 cm LEFT ATRIUM             Index       RIGHT ATRIUM           Index LA diam:        4.30 cm 2.06 cm/m  RA Area:     16.00 cm LA Vol (A2C):   63.2 ml 30.22 ml/m RA Volume:   42.10 ml  20.13 ml/m LA Vol (A4C):   66.8 ml 31.94 ml/m LA Biplane Vol: 70.4 ml 33.66 ml/m  AORTIC VALVE LVOT Vmax:   109.00 cm/s LVOT Vmean:  71.700 cm/s LVOT VTI:    0.170 m  AORTA Ao Root diam: 3.40 cm MR Peak grad: 66.9 mmHg MR Vmax:  409.00 cm/s SHUNTS                           Systemic VTI:  0.17 m                           Systemic Diam: 2.40 cm Loralie Champagne MD Electronically signed by Loralie Champagne MD Signature Date/Time: 03/06/2020/4:42:53 PM    Final      Medications:     Scheduled Medications: . amiodarone  200 mg Per Tube BID  . vitamin C  500 mg Per Tube TID  . aspirin  324 mg Per Tube Daily  . atorvastatin  80 mg Per Tube Daily  . busPIRone  10 mg Per Tube TID  . chlorhexidine gluconate (MEDLINE KIT)  15 mL Mouth Rinse BID  . Chlorhexidine Gluconate Cloth  6 each Topical Daily  . clonazepam  1 mg Oral TID  . darbepoetin (ARANESP) injection - NON-DIALYSIS  100 mcg Subcutaneous Q Sat-1800  . docusate  100 mg Per Tube BID  . feeding supplement (PROSource TF)  45 mL Per Tube TID  . fiber  1 packet Per Tube BID  . FLUoxetine  40 mg Per Tube BID  . heparin sodium  (porcine)      . insulin aspart  0-9 Units Subcutaneous Q4H  . insulin aspart  4 Units Subcutaneous Q4H  . insulin detemir  15 Units Subcutaneous Q12H  . LORazepam  0.5 mg Intravenous Q4H  . mouth rinse  15 mL Mouth Rinse 10 times per day  . melatonin  5 mg Per Tube Daily  . mexiletine  150 mg Per Tube Q8H  . midodrine  20 mg Per Tube TID WC  . multivitamin  1 tablet Per Tube QHS  . nutrition supplement (JUVEN)  1 packet Per Tube BID BM  . pantoprazole (PROTONIX) IV  40 mg Intravenous Q12H  . polyethylene glycol  17 g Per Tube Daily  . sodium chloride flush  10-40 mL Intracatheter Q12H  . thiamine  100 mg Per Tube Daily    Infusions: . sodium chloride Stopped (03/15/2020 0753)  . sodium chloride    . dextrose Stopped (03/23/2020 1520)  . DOBUTamine 3 mcg/kg/min (03/07/20 0500)  . feeding supplement (PIVOT 1.5 CAL) 1,000 mL (03/06/20 1828)  . fentaNYL infusion INTRAVENOUS 75 mcg/hr (03/07/20 0500)  . heparin 1,300 Units/hr (03/07/20 0500)  . lactated ringers Stopped (02/29/20 0835)  . meropenem (MERREM) IV    . norepinephrine (LEVOPHED) Adult infusion 19 mcg/min (03/07/20 0612)    PRN Medications: sodium chloride, Place/Maintain arterial line **AND** sodium chloride, artificial tears, bisacodyl **OR** bisacodyl, dextrose, docusate, fentaNYL (SUBLIMAZE) injection, heparin, heparin, HYDROmorphone (DILAUDID) injection, lip balm, loperamide HCl, LORazepam, metoprolol tartrate, nystatin, polyethylene glycol, sodium chloride flush     Assessment/Plan   1. Cardiogenic shock/acute on chronic systolic CHF: Initially nonischemic cardiomyopathy.  Possible familial cardiomyopathy as both parents had cardiomyopathy and died at around 31. However, [employer] gene testing did not show any common mutation for cardiomyopathy.  However, this admission noted to have severe 3 vessel disease so suspect component of ischemic cardiomyopathy.  St Jude ICD.  Echo in 8/20 with EF 15% and mildly decreased RV  function. Echo this admission with EF < 20%, moderate LV dilation, RV mildly reduced, severe LAE, no significant MR. Low output HF with markedly low EF.  He has a long history of cardiomyopathy (20 yrs), tends  to minimize symptoms.  Cardiorenal syndrome with creatinine up to 2.2,  stabilized on milrinone and Impella 5.5. SCr 1.44 day of CABG. CABG 12/15.  Post-op shock, back to OR 12/16 to open chest (chest wall compartment syndrome) and again to evacuate hematoma.  TEE post-op with severe RV dysfunction.  CVVHD initiated 12/19 for volume removal in setting of rising Scr/decreased UOP. Suspect ATN. S/p chest closure 12/23. Hemodynamics much worse on 12/24 with rising pressor demands. Co-ox 36%.  Bedside echo severe biventricular dysfunction with marked septal bounce. Chest re-opened 12/24.  Was atrial paced back to NSR on 12/26 with improvement in hemodynamics but back in atrial fibrillation on 12/27 and unable to pace out, DCCV to NSR on 12/28. Went back to OR 1/5 for chest closure but several hours later required emergent re-opening of chest at bedside to evacuate hematoma. To OR again 1/10 with closure of chest with omental flap with G & J tube.  1/17 partial closure. 1/21 Impella removed.  Off CVVH post-removal, he developed hyperkalemia and wide QRS rhythm requiring transient external pacing, also with suspected septic shock (low SVR, fever, rising WBCs).  Back to OR 2/3 for bleeding at chest wall wound vac.  2/7 CVVH discontinued + acute CVA (see below). 2/8 developing septic shock overnight requiring NE +Abx. 2/8 started trial iHD but currently with high NE requirements for BP support. Currently on NE 19 + DBA 3. Co-ox 77%.  - C/w abx for likely sepsis and attempt to wean NE post HD today.  - Continue midodrine 20 TID and continue dobutamine at 3. - Not candidate to replace Impella (at this point, not candidate for LVAD or transplant).  2. Atrial fibrillation: H/o PAF.  He was on dofetilide in the remote  past but this was stopped due to noncompliance.  He had an upper GI bleed from antral ulcers in 3/12. He was seen by GI and was deemed safe to restart anticoagulation as long as he remains on a PPI.  No apparent recurrence of AF until just prior to this admission, was cardioverted back to NSR in ER but went back into atrial fibrillation.  Post-op afib, DCCV to NSR/BiV pacing am 12/16 -> back to AF. Rapid atrial pacing back to SR. Remains in NSR - Continue amiodarone per tube 200 mg bid.  - heparin gtt for now, eventual Eliquis.    3. CAD:   Cath this admission with severe 3 vessel disease. CABG x 4 on 12/15.  - Continue atorvastatin, ASA.  4. Acute on chronic hypoxemic respiratory failure: Vent per CCM.  Has tracheostomy. Has been getting PSV trials. - Trial trach collar trials when sedation further weaned (per CCM).  5. AKI on CKD stage 3: Likely ATN/cardiorenal. Anuric. CVVH discontinued 2/7   - Attempting iHD today  6. Diabetes: Insulin.   7. Hyponatremia: 129 today  - continue iHD  8. Torsades/ICD shock: 12/7/212 in hospital event, in setting of severe hypokalemia and hypomagnesemia.   9. Gout: h/o gout. Complained of rt knee pain c/w previous flares - treated w/ prednisone burst (completed).  10: ID: Septic shock 1/21 with fever, low SVR, and elevated WBCs. Cultures no growth with exception of axillary cx on 1/21 grew staph epidermidis.  IJ catheter removed. Completed hydrocortisone. 2/8 recurrent septic shock. mTemp 100.2. WBC 9>>19K. Now on NE 19. Cultures resent. C/w abx.  11. FEN: c/w TFs.  12. Anemia: Hgb 8.4 today.  Transfuse < 8.  13. Stage II Pressure Ulcer- sacrum: Continue to reposition  every 2 hours R/L  14. Pleural Effusion: Loculated rt apical pleural effusion noted on imaging. Continue UF through CVVH.  PCCM following  15. VT: Amiodarone + mexiletine.   16. Large CVA: head CT 2/8 showed acute large left-sided posterior temporal and medial occipital lobe ischemic CVA.  Patient  has been on IV heparin for PAF, but has been on and off with surgeries and bleeding.  Suspect CVA is related to AF/LA thrombus.  - per neurology, continue IV heparin as benefits of heparin for prevention of recurrent stroke are felt to outweigh the risk of hemorrhagic conversion.   Poor prognosis at this point, chest wall is not healing well + large ischemic stroke and recurrent septic shock w/ new NE requirements.  He will remain full code until his son from the [place] has had time to see him (early this week), then transition to DNR.    Lyda Jester, PA-C  03/07/2020 7:16 AM  Patient seen with PA, agree with the above note.   Large ischemic CVA yesterday, suspect cardioembolic, with right-sided weakness.  He remains on heparin gtt.   Tolerated trach collar trial only briefly.   Febrile overnight, cultures sent and given doses of vancomycin and meropenem.   Attempting HD this morning but requiring NE 19 and dobutamine 3.    General: NAD Neck: Tracheostomy. No JVD, no thyromegaly or thyroid nodule.  Lungs: Decreased at bases.  CV: Nondisplaced PMI.  Heart regular S1/S2, no S3/S4, no murmur.  No peripheral edema.   Abdomen: Soft, nontender, no hepatosplenomegaly, no distention.  Skin: Intact without lesions or rashes.  Neurologic: Awake.  Does not move his right side. Extremities: No clubbing or cyanosis.  HEENT: Normal.   Patient is not currently able to tolerate iHD without significant pressor support (NE 19).    Recurrent fever with WBCs elevated.  May be due to acute CVA, but cover for now with vancomycin and meropenem.  Cultures sent.   Large CVA with right-sided weakness, likely cardioembolic.  Echo yesterday did not show LV thrombus.   At this point, with large CVA and inability to tolerate iHD without pressor support, I do not think that we have a path forwards.  Comfort care would be appropriate.  My understanding is that the family is going to be coming in today,  will ask palliative care to talk with them and will call his wife this morning.   CRITICAL CARE Performed by: Loralie Champagne  Total critical care time: 35 minutes  Critical care time was exclusive of separately billable procedures and treating other patients.  Critical care was necessary to treat or prevent imminent or life-threatening deterioration.  Critical care was time spent personally by me on the following activities: development of treatment plan with patient and/or surrogate as well as nursing, discussions with consultants, evaluation of patient's response to treatment, examination of patient, obtaining history from patient or surrogate, ordering and performing treatments and interventions, ordering and review of laboratory studies, ordering and review of radiographic studies, pulse oximetry and re-evaluation of patient's condition.  Loralie Champagne 03/07/2020 8:29 AM

## 2020-03-07 NOTE — Progress Notes (Signed)
Pharmacy Antibiotic Note  Corey Palmer is a 50 y.o. male admitted on 01/19/2020 with sepsis.  Pharmacy has been consulted for vancomycin and cefepime dosing.  Getting intermittent HD currently.  Given dose of vancomycin and meropenem overnight.  Plan: Vancomycin 1g IV x 1 after HD today.  F/u plans for subsequent HD. Meropenem 500 mg q 24 hrs - next dose tomorrow. F/u cultures, clinical course.  Height: 6\' 3"  (190.5 cm) Weight: 85 kg (187 lb 6.3 oz) IBW/kg (Calculated) : 84.5  Temp (24hrs), Avg:99.3 F (37.4 C), Min:98.4 F (36.9 C), Max:100.2 F (37.9 C)  Recent Labs  Lab 03/04/20 0412 03/04/20 1354 03/04/20 1542 03/05/20 0357 03/05/20 2001 03/06/20 0455 03/06/20 1437 03/07/20 0332 03/07/20 0642  WBC 13.2* 13.5*  --  9.9  --  9.7  --  19.2*  --   CREATININE 1.07  1.06  --    < > 0.96 0.99 1.00 1.24 2.05*  --   LATICACIDVEN  --   --   --   --   --   --   --   --  1.2   < > = values in this interval not displayed.    Estimated Creatinine Clearance: 51 mL/min (A) (by C-G formula based on SCr of 2.05 mg/dL (H)).    Allergies  Allergen Reactions  . Orange Fruit Anaphylaxis, Hives and Other (See Comments)    Per Pt- Blisters around lips and Hives all over, also  . Penicillins Anaphylaxis    Did it involve swelling of the face/tongue/throat, SOB, or low BP? Yes Did it involve sudden or severe rash/hives, skin peeling, or any reaction on the inside of your mouth or nose? Yes Did you need to seek medical attention at a hospital or doctor's office? No When did it last happen?childhood If all above answers are "NO", may proceed with cephalosporin use.  [name] [Empagliflozin] Itching  . [name] [Insulin Glargine] Nausea And Vomiting    Antimicrobials this admission: Vanc 12/15 >> 12/31, restart 1/4>1/12, restart 1/21>>1/31 Meropenem 12/16 >>12/31, restart 1/4 >1/12, restart 1/21>1/28 Eraxis 1/22>> 1/28 Nystatin 12/22 for oral thrush>12/30  Dose  adjustments this admission: 1/25 VT = 18 mcg/mL on 1g q24 >> no change (CRRT)  Microbiology results: 12/14 Bcx neg 12/16 Bcx neg MRSA PCR neg  12/20 TA rare yeast  12/23 sternal wound neg 1/4 Bcx: neg 1/4 TA - rare candida albicans 1/9 TA - rare yeast- reincubated 1/19 BCx: ng 1/21 TA: no orgs  1/21 Impella graft cx: rare staph epidermidis   1/21 BCx: negF 1/23 fungal cx: negF  2/7 TA: GPC 2/7 BCx x 2 :   Thank you for allowing pharmacy to be a part of this patient's care.  [name], [name] Reason, BCCP Clinical Pharmacist  03/07/2020 9:26 AM   [facility] pharmacy phone numbers are listed on [email or web address]

## 2020-03-07 NOTE — Progress Notes (Signed)
[facility] NEPHROLOGY PROGRESS NOTE  Assessment/ Plan:  # Acute kidney Injury on chronic kidney disease stage III,anuric: on CRRT since 01/16/2020.  Etiology suspected to be ischemic ATN in the setting of cardiogenic shock status post CABG.  CRRT held on 2/7.  Starting IHD today, BP acceptable however requiring Levophed and dobutamine.  Goal UF around 1 L if tolerated by BP.  Discussed with the dialysis nurse. Patient son is visiting the patient this week. If unable to tolerate IHD, will transition to comfort care.  # Coronary artery disease status post CABG: Complicated by cardiogenic shock postoperative hematoma requiring reexploration and secondary closure.  He has required reexploration of the sternal wound for recurrent bleeding, had debridement/application of wound VAC.  # Cardiogenic shock with ejection fraction of about 20%: Status post discontinuation of Impella 1/21.  Remains on high-dose midodrine for blood pressure support.  On fentanyl drip.  Requiring Levophed and dobutamine.  # Acute blood loss anemia: With recurrent bleeding from sternal wound noted, transfusion triggers per TCTS.  #Ventilator dependent respiratory failure: Status post tracheostomy on 1/10.  Enteral nutrition via PEG tube.  #Hyponatremia, hypervolemic: Managed with dialysis now, UF as tolerated.  #Hyperkalemia: HD with low potassium bath.  Monitor lab.  Subjective: Seen and examined ICU.  Doing intermittent IHD.  BP acceptable however requiring Levophed 19 mics and on dobutamine.  Remains confused.    Objective Vital signs in last 24 hours: Vitals:   03/07/20 0711 03/07/20 0730 03/07/20 0745 03/07/20 0800  BP: 113/63 (!) 108/59 105/64 112/65  Pulse:      Resp: (!) 29 (!) 29 (!) 24 (!) 25  Temp:      TempSrc:      SpO2: 100% 100% 100% 100%  Weight:      Height:       Weight change: 2.4 kg  Intake/Output Summary (Last 24 hours) at 03/07/2020 0803 Last data filed at 03/07/2020  0602 Gross per 24 hour  Intake 2880.34 ml  Output 708 ml  Net 2172.34 ml       Labs: Basic Metabolic Panel: Recent Labs  Lab 03/06/20 0455 03/06/20 1437 03/06/20 1854 03/06/20 2055 03/07/20 0332  NA 134* 133* 134*  --  129*  K 4.6 5.2* 5.7* 5.7* 5.5*  CL 102 100  --   --  99  CO2 25 22  --   --  19*  GLUCOSE 198* 115*  --   --  309*  BUN 43* 59*  --   --  93*  CREATININE 1.00 1.24  --   --  2.05*  CALCIUM 8.2* 8.5*  --   --  8.4*  PHOS 1.8* 1.8*  --   --  2.6   Liver Function Tests: Recent Labs  Lab 03/06/20 0455 03/06/20 1437 03/07/20 0332  ALBUMIN 2.0* 2.2* 2.0*   No results for input(s): LIPASE, AMYLASE in the last 168 hours. No results for input(s): AMMONIA in the last 168 hours. CBC: Recent Labs  Lab 03/04/20 0412 03/04/20 1354 03/05/20 0357 03/06/20 0455 03/06/20 1854 03/07/20 0332  WBC 13.2* 13.5* 9.9 9.7  --  19.2*  NEUTROABS 10.7*  --  8.0* 7.7  --  15.2*  HGB 7.3* 8.2* 8.1* 8.4* 9.5* 8.8*  HCT 24.7* 27.1* 28.0* 27.8* 28.0* 28.8*  MCV 90.1 90.6 94.3 92.1  --  90.3  PLT 234 218 203 205  --  311   Cardiac Enzymes: No results for input(s): CKTOTAL, CKMB, CKMBINDEX, TROPONINI in the last 168  hours. CBG: Recent Labs  Lab 03/06/20 1543 03/06/20 1939 03/06/20 2312 03/06/20 2320 03/07/20 0344  GLUCAP 155* 202* 291* 275* 265*    Iron Studies: No results for input(s): IRON, TIBC, TRANSFERRIN, FERRITIN in the last 72 hours. Studies/Results: DG Chest 1 View  Result Date: 03/06/2020 CLINICAL DATA:  Pneumonia EXAM: CHEST  1 VIEW COMPARISON:  03/04/2020 FINDINGS: Tracheostomy tube tip terminates 6.5 cm from the carina. Battery pack overlies left chest wall with pacer/defibrillator lead directed towards the cardiac apex. Enlarged cardiac silhouette. Right upper extremity PICC tip terminates at the superior cavoatrial junction. Telemetry leads and external support devices overlie the chest. Surgical clip seen in the right axilla. Stable cardiomegaly and  cardiomediastinal contours. Some increasing right perihilar opacity with fissural and septal thickening and vascular redistribution when compared to most recent imaging. No pneumothorax. Right apical pleural thickening is similar to prior studies. No acute osseous or soft tissue abnormality. IMPRESSION: Findings are most suggestive of some increasing edematous changes/CHF with cardiomegaly, vascular congestion and developing interstitial opacity/edema. Underlying infection is difficult to exclude. Stable right apical pleural thickening. Lines and tubes as above. Electronically Signed   By: Lovena Le M.D.   On: 03/06/2020 17:56   DG Abd 1 View  Result Date: 03/06/2020 CLINICAL DATA:  50 year old male with history of atrial fibrillation, RVR, tore sides, cardiogenic shock, CABG. Peg tube placed last month. EXAM: ABDOMEN - 1 VIEW COMPARISON:  Portable abdomen 02/10/2020 and earlier. FINDINGS: Portable AP supine view at 0325 hours. Partially visible cardiac AICD. Visible lung bases appear negative. There are 2 left side percutaneous enteric tubes, the lower tube appears to be a J-tube based on CT Abdomen and Pelvis 02/12/2020. Stable cholecystectomy clips. Visualized bowel gas pattern is non obstructed. No acute osseous abnormality identified. IMPRESSION: G-tube and J-tube in place with non obstructed bowel gas pattern. Electronically Signed   By: Genevie Ann M.D.   On: 03/06/2020 04:40   CT HEAD WO CONTRAST  Result Date: 03/06/2020 CLINICAL DATA:  Neurologic deficit EXAM: CT HEAD WITHOUT CONTRAST TECHNIQUE: Contiguous axial images were obtained from the base of the skull through the vertex without intravenous contrast. COMPARISON:  July 20, 2011 FINDINGS: Brain: Ventricles and sulci are normal in size and configuration. There is decreased attenuation throughout the medial and mid left occipital lobe regions as well as throughout much of the posterior left temporal lobe. Decreased attenuation is also noted  throughout a portion of the posterior limb of the left internal capsule as well as the mid to lateral left thalamus. These areas are felt to represent extensive acute infarct involving much of the left posterior cerebral artery distribution. Note that there is relative effacement of a portion of the atrium of the left lateral ventricle due to edema from this apparent acute infarct. Elsewhere, there is mild periventricular small vessel disease. There is no appreciable mass. There is no evident hemorrhage, extra-axial fluid collection, or midline shift. Vascular: There is no hyperdense vessel. Calcification is noted in each carotid siphon region an each distal vertebral artery. Skull: The bony calvarium appears intact. Sinuses/Orbits: There is opacification in a left ethmoid air cell. Other visualized paranasal sinuses are clear. Orbits appear symmetric bilaterally. Other: Opacification is noted in multiple mastoid air cells bilaterally. IMPRESSION: 1. Apparent acute infarct involving portions of the left posterior temporal and medial to mid left occipital lobes as well as the posterior limb of the left internal capsule and much of the mid to lateral left thalamus. This large infarct  is causing significant effacement of the atrium of the left lateral ventricle. 2.  No mass or hemorrhage evident.  No midline shift. 3.  There is periventricular small vessel disease. 4.  Foci of arterial vascular calcification noted. 5. Foci of mastoid air cell opacification bilaterally. Opacification also noted in a midportion left ethmoid air cell. These results will be called to the ordering clinician or representative by the Radiologist Assistant, and communication documented in the PACS or [employer]. 2.  There is mild periventricular small vessel disease. Electronically Signed   By: Lowella Grip III M.D.   On: 03/06/2020 13:52   ECHOCARDIOGRAM COMPLETE  Result Date: 03/06/2020    ECHOCARDIOGRAM REPORT   Patient Name:    Corey Palmer Date of Exam: 03/06/2020 Medical Rec #:  [id number]     Height:       75.0 in Accession #:    [id number]    Weight:       178.6 lb Date of Birth:  1970/06/13     BSA:          2.091 m Patient Age:    50 years      BP:           90/59 mmHg Patient Gender: M             HR:           97 bpm. Exam Location:  Inpatient Procedure: 2D Echo, Cardiac Doppler, Color Doppler and Intracardiac            Opacification Agent Indications:    CHF-Acute Systolic H84.69  History:        Patient has prior history of Echocardiogram examinations, most                 recent 02/12/2020. Cardiomyopathy, CAD, Arrythmias:Atrial                 Fibrillation; Risk Factors:Hypertension, Diabetes, Dyslipidemia                 and Sleep Apnea. GERD.  Sonographer:    Vickie Epley RDCS Referring Phys: [place] Comments: Echo performed with patient supine and on artificial respirator. IMPRESSIONS  1. Left ventricular ejection fraction, by estimation, is 25%. The left ventricle has severely decreased function. The left ventricle demonstrates regional wall motion abnormalities, global hypokinesis with septal dyskinesis. The left ventricular internal cavity size was moderately dilated. Left ventricular diastolic parameters are indeterminate. No LV thrombus was noted.  2. Right ventricular systolic function is moderately reduced. The right ventricular size is normal. Tricuspid regurgitation signal is inadequate for assessing PA pressure.  3. Left atrial size was mildly dilated.  4. The mitral valve is normal in structure. Trivial mitral valve regurgitation. No evidence of mitral stenosis.  5. The aortic valve is tricuspid. Aortic valve regurgitation is not visualized. No aortic stenosis is present.  6. The inferior vena cava is normal in size with <50% respiratory variability, suggesting right atrial pressure of 8 mmHg. FINDINGS  Left Ventricle: Left ventricular ejection fraction, by estimation, is 25%. The left  ventricle has severely decreased function. The left ventricle demonstrates regional wall motion abnormalities. Definity contrast agent was given IV to delineate the left  ventricular endocardial borders. The left ventricular internal cavity size was moderately dilated. There is no left ventricular hypertrophy. Left ventricular diastolic parameters are indeterminate. Right Ventricle: The right ventricular size is normal. No increase in right ventricular wall thickness. Right ventricular  systolic function is moderately reduced. Tricuspid regurgitation signal is inadequate for assessing PA pressure. Left Atrium: Left atrial size was mildly dilated. Right Atrium: Right atrial size was normal in size. Pericardium: There is no evidence of pericardial effusion. Mitral Valve: The mitral valve is normal in structure. Trivial mitral valve regurgitation. No evidence of mitral valve stenosis. Tricuspid Valve: The tricuspid valve is normal in structure. Tricuspid valve regurgitation is trivial. Aortic Valve: The aortic valve is tricuspid. Aortic valve regurgitation is not visualized. No aortic stenosis is present. Pulmonic Valve: The pulmonic valve was normal in structure. Pulmonic valve regurgitation is not visualized. Aorta: The aortic root is normal in size and structure. Venous: The inferior vena cava is normal in size with less than 50% respiratory variability, suggesting right atrial pressure of 8 mmHg. IAS/Shunts: No atrial level shunt detected by color flow Doppler. Additional Comments: A pacer wire is visualized in the right ventricle.  LEFT VENTRICLE PLAX 2D LVIDd:         6.40 cm LVIDs:         5.50 cm LV PW:         1.10 cm LV IVS:        1.10 cm LVOT diam:     2.40 cm LV SV:         77 LV SV Index:   37 LVOT Area:     4.52 cm  RIGHT VENTRICLE TAPSE (M-mode): 0.7 cm LEFT ATRIUM             Index       RIGHT ATRIUM           Index LA diam:        4.30 cm 2.06 cm/m  RA Area:     16.00 cm LA Vol (A2C):   63.2 ml  30.22 ml/m RA Volume:   42.10 ml  20.13 ml/m LA Vol (A4C):   66.8 ml 31.94 ml/m LA Biplane Vol: 70.4 ml 33.66 ml/m  AORTIC VALVE LVOT Vmax:   109.00 cm/s LVOT Vmean:  71.700 cm/s LVOT VTI:    0.170 m  AORTA Ao Root diam: 3.40 cm MR Peak grad: 66.9 mmHg MR Vmax:      409.00 cm/s SHUNTS                           Systemic VTI:  0.17 m                           Systemic Diam: 2.40 cm Loralie Champagne MD Electronically signed by Loralie Champagne MD Signature Date/Time: 03/06/2020/4:42:53 PM    Final     Medications: Infusions: . sodium chloride Stopped (03/22/2020 0753)  . sodium chloride    . dextrose Stopped (03/22/2020 1520)  . DOBUTamine 3 mcg/kg/min (03/07/20 0500)  . feeding supplement (PIVOT 1.5 CAL) 1,000 mL (03/06/20 1828)  . fentaNYL infusion INTRAVENOUS 75 mcg/hr (03/07/20 0500)  . heparin 1,400 Units/hr (03/07/20 0738)  . lactated ringers Stopped (02/29/20 0835)  . meropenem (MERREM) IV    . norepinephrine (LEVOPHED) Adult infusion 19 mcg/min (03/07/20 0612)    Scheduled Medications: . amiodarone  200 mg Per Tube BID  . vitamin C  500 mg Per Tube TID  . aspirin  324 mg Per Tube Daily  . atorvastatin  80 mg Per Tube Daily  . busPIRone  10 mg Per Tube TID  . chlorhexidine gluconate (MEDLINE KIT)  15  mL Mouth Rinse BID  . Chlorhexidine Gluconate Cloth  6 each Topical Daily  . clonazepam  1 mg Oral TID  . darbepoetin (ARANESP) injection - NON-DIALYSIS  100 mcg Subcutaneous Q Sat-1800  . docusate  100 mg Per Tube BID  . feeding supplement (PROSource TF)  45 mL Per Tube TID  . fiber  1 packet Per Tube BID  . FLUoxetine  40 mg Per Tube BID  . heparin sodium (porcine)      . insulin aspart  0-9 Units Subcutaneous Q4H  . insulin aspart  4 Units Subcutaneous Q4H  . insulin detemir  15 Units Subcutaneous Q12H  . LORazepam  0.5 mg Intravenous Q4H  . mouth rinse  15 mL Mouth Rinse 10 times per day  . melatonin  5 mg Per Tube Daily  . mexiletine  150 mg Per Tube Q8H  . midodrine  20 mg Per  Tube TID WC  . multivitamin  1 tablet Per Tube QHS  . nutrition supplement (JUVEN)  1 packet Per Tube BID BM  . pantoprazole (PROTONIX) IV  40 mg Intravenous Q12H  . polyethylene glycol  17 g Per Tube Daily  . sodium chloride flush  10-40 mL Intracatheter Q12H  . thiamine  100 mg Per Tube Daily    have reviewed scheduled and prn medications.  Physical Exam: Exam not changed from yesterday. General:NAD, comfortable, on ventilator via tracheostomy opening eyes and following simple commands. Heart:RRR, s1s2 nl Lungs: Anteriorly clear. Abdomen:soft, Non-tender, non-distended Extremities:No edema Dialysis Access: Right femoral dialysis HD catheter.  Site clean  [employer] 03/07/2020,8:03 AM  LOS: 68 days

## 2020-03-08 ENCOUNTER — Ambulatory Visit: Payer: BC Managed Care – PPO | Admitting: Endocrinology

## 2020-03-08 LAB — CBC WITH DIFFERENTIAL/PLATELET
Abs Immature Granulocytes: 0 10*3/uL (ref 0.00–0.07)
Basophils Absolute: 0.3 10*3/uL — ABNORMAL HIGH (ref 0.0–0.1)
Basophils Relative: 1 %
Eosinophils Absolute: 0 10*3/uL (ref 0.0–0.5)
Eosinophils Relative: 0 %
HCT: 26.7 % — ABNORMAL LOW (ref 39.0–52.0)
Hemoglobin: 7.7 g/dL — ABNORMAL LOW (ref 13.0–17.0)
Lymphocytes Relative: 10 %
Lymphs Abs: 2.6 10*3/uL (ref 0.7–4.0)
MCH: 26.6 pg (ref 26.0–34.0)
MCHC: 28.8 g/dL — ABNORMAL LOW (ref 30.0–36.0)
MCV: 92.4 fL (ref 80.0–100.0)
Monocytes Absolute: 1.3 10*3/uL — ABNORMAL HIGH (ref 0.1–1.0)
Monocytes Relative: 5 %
Neutro Abs: 21.8 10*3/uL — ABNORMAL HIGH (ref 1.7–7.7)
Neutrophils Relative %: 84 %
Platelets: 273 10*3/uL (ref 150–400)
RBC: 2.89 MIL/uL — ABNORMAL LOW (ref 4.22–5.81)
RDW: 18.7 % — ABNORMAL HIGH (ref 11.5–15.5)
WBC: 26 10*3/uL — ABNORMAL HIGH (ref 4.0–10.5)
nRBC: 0 % (ref 0.0–0.2)
nRBC: 0 /100 WBC

## 2020-03-08 LAB — RENAL FUNCTION PANEL
Albumin: 1.7 g/dL — ABNORMAL LOW (ref 3.5–5.0)
Anion gap: 13 (ref 5–15)
BUN: 87 mg/dL — ABNORMAL HIGH (ref 6–20)
CO2: 18 mmol/L — ABNORMAL LOW (ref 22–32)
Calcium: 7.8 mg/dL — ABNORMAL LOW (ref 8.9–10.3)
Chloride: 95 mmol/L — ABNORMAL LOW (ref 98–111)
Creatinine, Ser: 2.05 mg/dL — ABNORMAL HIGH (ref 0.61–1.24)
GFR, Estimated: 39 mL/min — ABNORMAL LOW (ref >60)
Glucose, Bld: 406 mg/dL — ABNORMAL HIGH (ref 70–99)
Phosphorus: 3.2 mg/dL (ref 2.5–4.6)
Potassium: 4.7 mmol/L (ref 3.5–5.1)
Sodium: 126 mmol/L — ABNORMAL LOW (ref 135–145)

## 2020-03-08 LAB — COOXEMETRY PANEL
Carboxyhemoglobin: 2.2 % — ABNORMAL HIGH (ref 0.5–1.5)
Methemoglobin: 0.9 % (ref 0.0–1.5)
O2 Saturation: 67.1 %
Total hemoglobin: 9.7 g/dL — ABNORMAL LOW (ref 12.0–16.0)

## 2020-03-08 LAB — GLUCOSE, CAPILLARY
Glucose-Capillary: 211 mg/dL — ABNORMAL HIGH (ref 70–99)
Glucose-Capillary: 224 mg/dL — ABNORMAL HIGH (ref 70–99)

## 2020-03-08 LAB — MAGNESIUM: Magnesium: 2.3 mg/dL (ref 1.7–2.4)

## 2020-03-08 LAB — HEPARIN LEVEL (UNFRACTIONATED): Heparin Unfractionated: <0.1 IU/mL — ABNORMAL LOW (ref 0.30–0.70)

## 2020-03-08 MED ORDER — VANCOMYCIN VARIABLE DOSE PER UNSTABLE RENAL FUNCTION (PHARMACIST DOSING): Status: DC

## 2020-03-08 MED FILL — Heparin Sodium (Porcine) Inj 1000 Unit/ML: INTRAMUSCULAR | Qty: 30 | Status: AC

## 2020-03-08 MED FILL — Sodium Chloride IV Soln 0.9%: INTRAVENOUS | Qty: 2000 | Status: AC

## 2020-03-09 LAB — ACID FAST CULTURE WITH REFLEXED SENSITIVITIES (MYCOBACTERIA): Acid Fast Culture: NEGATIVE

## 2020-03-09 LAB — CULTURE, RESPIRATORY W GRAM STAIN: Culture: NORMAL

## 2020-03-11 LAB — CULTURE, BLOOD (ROUTINE X 2)
Culture: NO GROWTH
Culture: NO GROWTH
Special Requests: ADEQUATE
Special Requests: ADEQUATE

## 2020-03-28 NOTE — Discharge Summary (Signed)
DEATH SUMMARY   Patient Details  Name: Corey Palmer MRN: [id number] DOB: 22-Apr-1970  Admission/Discharge Information   Admit Date:  01-19-20  Date of Death: Date of Death: 03-29-20  Time of Death: Time of Death: 0734  Length of Stay: 11-May-2067  Referring Physician: Sueanne Margarita, MD   Reason(s) for Hospitalization  Management of congestive heart failure  Diagnoses  Preliminary cause of death:  Secondary Diagnoses (including complications and co-morbidities):  Active Problems:   Cardiogenic shock (HCC)   Chest tube in place   CHF (congestive heart failure) (HCC)   Atrial fibrillation with RVR (HCC)   S/P CABG x 4   Pressure injury of skin   Acute hypoxemic respiratory failure (HCC)   AKI (acute kidney injury) ([place])   History of open heart surgery   Ventilator dependence ([place])   Cerebral embolism with cerebral infarction   Brief Hospital Course (including significant findings, care, treatment, and services provided and events leading to death)  Corey Palmer is a 50 y.o. year old male who presented in early Dec 2021 with decompensated heart failure.  He had a long history of nonischemic cardiomyopathy status post St Jude ICD several years prior which was complicated by cardiac perforation and the need for urgent sternotomy..  Previously, he had nonobstructive coronary disease.  He had a history of atrial fibrillation and diabetes.  On presentation he experienced an episode of torsades V. tach.  The patient was stabilized and underwent placement of a Impella 5.5 LVAD on 12/30/2019.  He then underwent work-up of his decompensated heart failure which demonstrated severe multivessel coronary artery disease.  That the patient underwent redo sternotomy and coronary bypass grafting on 01/22/2020.  He experienced an episode of cardiac tamponade and was taken back to the operating on 01/16/2020.  The chest was left open.  Attempts were made to close the chest approximately 1 week later but he  require chest reopening.  The patient then had a prolonged ICU stay which was characterized by renal insufficiency requiring continuous venovenous dialysis and ventilator dependence.  He underwent omental flap repair of the sternum and had feeding tubes inserted.  Ultimately, the Impella was able to be removed and he was weaning from the ventilator but suffered a large stroke the first week of February 2022.  At this point his potential for meaningful recovery was considered very poor and the family elected to withdraw care.    Pertinent Labs and Studies  Significant Diagnostic Studies DG Chest 1 View  Result Date: 03/06/2020 CLINICAL DATA:  Pneumonia EXAM: CHEST  1 VIEW COMPARISON:  03/04/2020 FINDINGS: Tracheostomy tube tip terminates 6.5 cm from the carina. Battery pack overlies left chest wall with pacer/defibrillator lead directed towards the cardiac apex. Enlarged cardiac silhouette. Right upper extremity PICC tip terminates at the superior cavoatrial junction. Telemetry leads and external support devices overlie the chest. Surgical clip seen in the right axilla. Stable cardiomegaly and cardiomediastinal contours. Some increasing right perihilar opacity with fissural and septal thickening and vascular redistribution when compared to most recent imaging. No pneumothorax. Right apical pleural thickening is similar to prior studies. No acute osseous or soft tissue abnormality. IMPRESSION: Findings are most suggestive of some increasing edematous changes/CHF with cardiomegaly, vascular congestion and developing interstitial opacity/edema. Underlying infection is difficult to exclude. Stable right apical pleural thickening. Lines and tubes as above. Electronically Signed   By: Lovena Le M.D.   On: 03/06/2020 17:56   DG Chest 1 View  Result Date: 03/27/2020  CLINICAL DATA:  Tracheostomy.  Open-heart surgery. EXAM: CHEST  1 VIEW COMPARISON:  02/28/2020.  CT 02/12/2020. FINDINGS: Tracheostomy tube and  right PICC line and stable position. Cardiac pacer in stable position. Stable cardiomegaly. Low lung volumes with bibasilar atelectasis. Bibasilar infiltrates cannot be excluded. No prominent pleural effusion. Left costophrenic angle incompletely imaged. Stable right apical pleural based density, possibly loculated fluid again noted. No pneumothorax. Surgical staples noted over the right chest IMPRESSION: 1. Tracheostomy tube and right PICC line in stable position. 2. Cardiac pacer in stable position. Stable cardiomegaly. 3. Low lung volumes with bibasilar atelectasis. Bibasilar infiltrates cannot be excluded. Stable right apical pleural based density loculated pleural effusion. No pneumothorax. Electronically Signed   By: Marcello Moores  Register   On: 03/03/2020 06:05   DG Chest 1 View  Result Date: 02/17/2020 CLINICAL DATA:  Respiratory failure.  Tracheostomy present EXAM: CHEST  1 VIEW COMPARISON:  February 16, 2020 FINDINGS: Tracheostomy catheter tip is 6.2 cm above the carina. There is an Impella device, unchanged in position. There are bilateral chest tubes and a mediastinal drain. There is a pacemaker with lead tip attached to the right atrium. No pneumothorax. There is opacity in the right apex region with overlying skin staples. There is ill-defined airspace opacity in the left lower lobe with small left pleural effusion. There is a small right pleural effusion. There is stable cardiomegaly with pulmonary venous hypertension. No adenopathy. No bone lesions. IMPRESSION: Tube and catheter positions as described without pneumothorax. Suspect hematoma right apex. Atelectatic change left lower lobe. Small pleural effusions bilaterally. No new opacity. Stable cardiac prominence with pulmonary vascular congestion. Electronically Signed   By: Lowella Grip III M.D.   On: 02/17/2020 07:58   DG Abd 1 View  Result Date: 03/06/2020 CLINICAL DATA:  50 year old male with history of atrial fibrillation, RVR, tore  sides, cardiogenic shock, CABG. Peg tube placed last month. EXAM: ABDOMEN - 1 VIEW COMPARISON:  Portable abdomen 02/06/2020 and earlier. FINDINGS: Portable AP supine view at 0325 hours. Partially visible cardiac AICD. Visible lung bases appear negative. There are 2 left side percutaneous enteric tubes, the lower tube appears to be a J-tube based on CT Abdomen and Pelvis 02/12/2020. Stable cholecystectomy clips. Visualized bowel gas pattern is non obstructed. No acute osseous abnormality identified. IMPRESSION: G-tube and J-tube in place with non obstructed bowel gas pattern. Electronically Signed   By: Genevie Ann M.D.   On: 03/06/2020 04:40   CT HEAD WO CONTRAST  Result Date: 03/06/2020 CLINICAL DATA:  Neurologic deficit EXAM: CT HEAD WITHOUT CONTRAST TECHNIQUE: Contiguous axial images were obtained from the base of the skull through the vertex without intravenous contrast. COMPARISON:  July 20, 2011 FINDINGS: Brain: Ventricles and sulci are normal in size and configuration. There is decreased attenuation throughout the medial and mid left occipital lobe regions as well as throughout much of the posterior left temporal lobe. Decreased attenuation is also noted throughout a portion of the posterior limb of the left internal capsule as well as the mid to lateral left thalamus. These areas are felt to represent extensive acute infarct involving much of the left posterior cerebral artery distribution. Note that there is relative effacement of a portion of the atrium of the left lateral ventricle due to edema from this apparent acute infarct. Elsewhere, there is mild periventricular small vessel disease. There is no appreciable mass. There is no evident hemorrhage, extra-axial fluid collection, or midline shift. Vascular: There is no hyperdense vessel. Calcification is noted in  each carotid siphon region an each distal vertebral artery. Skull: The bony calvarium appears intact. Sinuses/Orbits: There is opacification in a  left ethmoid air cell. Other visualized paranasal sinuses are clear. Orbits appear symmetric bilaterally. Other: Opacification is noted in multiple mastoid air cells bilaterally. IMPRESSION: 1. Apparent acute infarct involving portions of the left posterior temporal and medial to mid left occipital lobes as well as the posterior limb of the left internal capsule and much of the mid to lateral left thalamus. This large infarct is causing significant effacement of the atrium of the left lateral ventricle. 2.  No mass or hemorrhage evident.  No midline shift. 3.  There is periventricular small vessel disease. 4.  Foci of arterial vascular calcification noted. 5. Foci of mastoid air cell opacification bilaterally. Opacification also noted in a midportion left ethmoid air cell. These results will be called to the ordering clinician or representative by the Radiologist Assistant, and communication documented in the PACS or [employer]. 2.  There is mild periventricular small vessel disease. Electronically Signed   By: Lowella Grip III M.D.   On: 03/06/2020 13:52   DG CHEST PORT 1 VIEW  Result Date: 03/07/2020 CLINICAL DATA:  Leukocytosis EXAM: PORTABLE CHEST 1 VIEW COMPARISON:  March 06, 2020 and February 23, 2020 FINDINGS: Tracheostomy catheter tip is 6.1 cm above the carina. Central catheter tip is in the superior vena cava. Pacemaker lead is attached to the right ventricle. No pneumothorax. There is persistent opacity in the right apicolateral region, stable. There is ill-defined airspace opacity in the right lower lung region. Left lung is clear. There is stable cardiomegaly. The pulmonary vascularity is normal. No adenopathy. No bone lesions. IMPRESSION: Tube and catheter positions as described without pneumothorax. Persistent right apical/apicolateral opacity. Increased opacity right base, likely developing pneumonia. Stable cardiomegaly. Electronically Signed   By: Lowella Grip III M.D.   On:  03/07/2020 08:01   DG Chest Port 1 View  Result Date: 03/04/2020 CLINICAL DATA:  Abnormal respirations. Status post vacuo a shin of sternal wound hematoma yesterday. EXAM: PORTABLE CHEST 1 VIEW COMPARISON:  One-view chest x-ray 03/03/2020. FINDINGS: Tracheostomy tube is stable. Heart is enlarged. Right apical pleural effusion is stable. Mild bibasilar atelectasis is unchanged. IMPRESSION: 1. Stable right apical pleural effusion. 2. Stable mild bibasilar atelectasis. 3. Stable tracheostomy tube. Electronically Signed   By: San Morelle M.D.   On: 03/04/2020 07:10   DG Chest Port 1 View  Result Date: 03/03/2020 CLINICAL DATA:  Tracheostomy check. EXAM: PORTABLE CHEST 1 VIEW COMPARISON:  March 02, 2020 FINDINGS: The tracheostomy tube is in good position. A right PICC line is stable. The distal tip is difficult to see but appears to terminate in the SVC. The AICD device is stable as well. No pneumothorax. The cardiomediastinal silhouette is stable. Mild atelectasis in the right base. The lungs are otherwise clear. Persistent thickening of the pleural stripe along the right apex laterally is stable. IMPRESSION: 1. Support apparatus as above. 2. Continued thickening of the pleural stripe laterally in the right apex, unchanged, probably loculated fluid. 3. No other changes. Electronically Signed   By: Dorise Bullion III M.D   On: 03/03/2020 08:48   DG Chest Port 1 View  Result Date: 03/23/2020 CLINICAL DATA:  Abnormal respiration EXAM: PORTABLE CHEST 1 VIEW COMPARISON:  03/16/2020 FINDINGS: Tracheostomy tube tip terminates in the mid trachea, 4.8 cm from the carina. Pacer/defibrillator battery pack overlies left chest wall with lead at the cardiac apex. Surgical changes  noted in the right axilla correlate for prior vascular cutdown. Right upper extremity PICC tip terminates in the mid SVC. Persistent thickening along the right lung apex may reflect some loculated fluid. No basilar effusions are seen. No  new airspace disease. No visible pneumothorax. Stable cardiomediastinal contours with cardiomegaly. No other acute osseous or soft tissue abnormality. IMPRESSION: 1. Tracheostomy and right PICC in stable satisfactory position. 2. Persistent thickening along the right lung apex may reflect some loculated fluid. 3. Low volumes and atelectasis without new airspace disease. Electronically Signed   By: Lovena Le M.D.   On: 03/22/2020 05:52   DG CHEST PORT 1 VIEW  Result Date: 02/28/2020 CLINICAL DATA:  Respiratory failure EXAM: PORTABLE CHEST 1 VIEW COMPARISON:  02/23/2020 FINDINGS: Interval removal of right-sided chest tube. Tracheostomy tube remains in place. Left-sided implanted cardiac device. Right-sided PICC line terminates within the SVC. Stable cardiomegaly. Stable pleural based density at the right lung apex. Lungs otherwise clear. No pneumothorax. IMPRESSION: Interval removal of right-sided chest tube. No pneumothorax. Electronically Signed   By: Davina Poke D.O.   On: 02/28/2020 07:56   DG CHEST PORT 1 VIEW  Result Date: 02/23/2020 CLINICAL DATA:  Tracheostomy, chest tube EXAM: PORTABLE CHEST 1 VIEW COMPARISON:  02/22/2020 FINDINGS: Cardiac shadow remains enlarged. Defibrillator is again noted. Tracheostomy tube is noted in satisfactory position. Sternal closing units are noted. Lungs are well aerated bilaterally. Right-sided chest tube is seen without evidence of pneumothorax. A pleural based density is noted in the right apex which may represent loculated fluid. No focal infiltrate is seen. Mediastinal drain is noted as well. IMPRESSION: Postsurgical changes as described. No pneumothorax is seen. Persistent pleural based density in the right apex is seen. This may represent loculated fluid. Electronically Signed   By: Inez Catalina M.D.   On: 02/23/2020 06:43   DG CHEST PORT 1 VIEW  Result Date: 02/22/2020 CLINICAL DATA:  Tracheostomy.  Chest tube. EXAM: PORTABLE CHEST 1 VIEW COMPARISON:   02/20/2020. FINDINGS: Tracheostomy tube, right PICC line, mediastinal drainage catheter, right chest wall drain, bilateral chest tubes in stable position. No pneumothorax. Cardiac pacer and stable position. Stable cardiomegaly. Diffuse bilateral interstitial prominence consistent with interstitial edema and or pneumonitis. Similar findings on prior exam. Stable right apical pleural based density. Small right pleural effusion. No pneumothorax. IMPRESSION: 1. Lines and tubes including bilateral chest tubes in stable position. No pneumothorax. 2. Cardiac pacer stable position.  Stable cardiomegaly. 3. Diffuse bilateral interstitial prominence consistent with interstitial edema and or pneumonitis. Similar findings noted on prior exam. Stable right apical pleural based density. Small right pleural effusion. Electronically Signed   By: Marcello Moores  Register   On: 02/22/2020 06:32   DG CHEST PORT 1 VIEW  Result Date: 02/20/2020 CLINICAL DATA:  Postop follow-up EXAM: PORTABLE CHEST 1 VIEW COMPARISON:  Yesterday FINDINGS: Cardiomegaly that is unchanged. Single chamber defibrillator lead into the right ventricle. Right PICC with tip at the SVC. Tracheostomy tube in good position. Unchanged extrapleural opacity at the right apex which was pulmonary and parenchymal by CT. Hazy opacity in the perihilar lungs likely from atelectasis. No visible pneumothorax. IMPRESSION: Unchanged hazy opacity of the bilateral chest likely from atelectasis and pleural fluid based on most recent CT. Electronically Signed   By: Monte Fantasia M.D.   On: 02/20/2020 07:09   DG CHEST PORT 1 VIEW  Result Date: 02/19/2020 CLINICAL DATA:  Coronary artery bypass grafting, respiratory failure EXAM: PORTABLE CHEST 1 VIEW COMPARISON:  02/19/2020 FINDINGS: Tracheostomy overlies its  expected position. Right internal jugular hemodialysis catheter has been removed. Pulmonary insufflation is stable. Bilateral chest tubes are in place. No pneumothorax or  pleural effusion on the left. On the right, a loculated apicolateral pleural effusion appears stable. Surgical drain and surgical skin staples are again seen within the right chest wall overlying this region. No pneumothorax. Mild cardiomegaly is unchanged. Left subclavian pacemaker defibrillator is unchanged. Mediastinal drain is unchanged. IMPRESSION: Interval removal of right internal jugular hemodialysis catheter. Otherwise stable examination with loculated right apicolateral pleural effusion. Bilateral chest tubes in place without evidence of pneumothorax. Electronically Signed   By: Fidela Salisbury MD   On: 02/19/2020 06:42   DG CHEST PORT 1 VIEW  Result Date: 02/19/2020 CLINICAL DATA:  Removal of Impella device EXAM: PORTABLE CHEST 1 VIEW COMPARISON:  February 17, 2020 FINDINGS: Impella device has been removed. Tracheostomy catheter tip is 6.8 cm above the carina. Central catheter tip is in the superior vena cava near the cavoatrial junction. Pacemaker lead attached to right ventricle. Drain and skin staples again noted in the right apex with right apical pleural opacity, stable. There is ill-defined opacity in the right lower lung region which in part may be due to layering pleural effusion. There is mild left base atelectasis. There is stable cardiomegaly. Pulmonary vascularity is normal. No adenopathy. No bone lesions. IMPRESSION: Tube and catheter positions as described without pneumothorax evident. Postoperative change right apex with asymmetric opacity in this area. Suspect layering pleural effusion on the right. Mild left base atelectasis. Stable cardiomegaly. Electronically Signed   By: Lowella Grip III M.D.   On: 02/15/2020 11:12   ECHOCARDIOGRAM COMPLETE  Result Date: 03/06/2020    ECHOCARDIOGRAM REPORT   Patient Name:   HAMMAD FINKLER Date of Exam: 03/06/2020 Medical Rec #:  [id number]     Height:       75.0 in Accession #:    [id number]    Weight:       178.6 lb Date of Birth:  07/12/1970      BSA:          2.091 m Patient Age:    15 years      BP:           90/59 mmHg Patient Gender: M             HR:           97 bpm. Exam Location:  Inpatient Procedure: 2D Echo, Cardiac Doppler, Color Doppler and Intracardiac            Opacification Agent Indications:    CHF-Acute Systolic Y63.78  History:        Patient has prior history of Echocardiogram examinations, most                 recent 02/26/2020. Cardiomyopathy, CAD, Arrythmias:Atrial                 Fibrillation; Risk Factors:Hypertension, Diabetes, Dyslipidemia                 and Sleep Apnea. GERD.  Sonographer:    Vickie Epley RDCS Referring Phys: Belva Comments: Echo performed with patient supine and on artificial respirator. IMPRESSIONS  1. Left ventricular ejection fraction, by estimation, is 25%. The left ventricle has severely decreased function. The left ventricle demonstrates regional wall motion abnormalities, global hypokinesis with septal dyskinesis. The left ventricular internal cavity size was moderately dilated. Left ventricular diastolic parameters are indeterminate. No  LV thrombus was noted.  2. Right ventricular systolic function is moderately reduced. The right ventricular size is normal. Tricuspid regurgitation signal is inadequate for assessing PA pressure.  3. Left atrial size was mildly dilated.  4. The mitral valve is normal in structure. Trivial mitral valve regurgitation. No evidence of mitral stenosis.  5. The aortic valve is tricuspid. Aortic valve regurgitation is not visualized. No aortic stenosis is present.  6. The inferior vena cava is normal in size with <50% respiratory variability, suggesting right atrial pressure of 8 mmHg. FINDINGS  Left Ventricle: Left ventricular ejection fraction, by estimation, is 25%. The left ventricle has severely decreased function. The left ventricle demonstrates regional wall motion abnormalities. Definity contrast agent was given IV to delineate the left   ventricular endocardial borders. The left ventricular internal cavity size was moderately dilated. There is no left ventricular hypertrophy. Left ventricular diastolic parameters are indeterminate. Right Ventricle: The right ventricular size is normal. No increase in right ventricular wall thickness. Right ventricular systolic function is moderately reduced. Tricuspid regurgitation signal is inadequate for assessing PA pressure. Left Atrium: Left atrial size was mildly dilated. Right Atrium: Right atrial size was normal in size. Pericardium: There is no evidence of pericardial effusion. Mitral Valve: The mitral valve is normal in structure. Trivial mitral valve regurgitation. No evidence of mitral valve stenosis. Tricuspid Valve: The tricuspid valve is normal in structure. Tricuspid valve regurgitation is trivial. Aortic Valve: The aortic valve is tricuspid. Aortic valve regurgitation is not visualized. No aortic stenosis is present. Pulmonic Valve: The pulmonic valve was normal in structure. Pulmonic valve regurgitation is not visualized. Aorta: The aortic root is normal in size and structure. Venous: The inferior vena cava is normal in size with less than 50% respiratory variability, suggesting right atrial pressure of 8 mmHg. IAS/Shunts: No atrial level shunt detected by color flow Doppler. Additional Comments: A pacer wire is visualized in the right ventricle.  LEFT VENTRICLE PLAX 2D LVIDd:         6.40 cm LVIDs:         5.50 cm LV PW:         1.10 cm LV IVS:        1.10 cm LVOT diam:     2.40 cm LV SV:         77 LV SV Index:   37 LVOT Area:     4.52 cm  RIGHT VENTRICLE TAPSE (M-mode): 0.7 cm LEFT ATRIUM             Index       RIGHT ATRIUM           Index LA diam:        4.30 cm 2.06 cm/m  RA Area:     16.00 cm LA Vol (A2C):   63.2 ml 30.22 ml/m RA Volume:   42.10 ml  20.13 ml/m LA Vol (A4C):   66.8 ml 31.94 ml/m LA Biplane Vol: 70.4 ml 33.66 ml/m  AORTIC VALVE LVOT Vmax:   109.00 cm/s LVOT Vmean:   71.700 cm/s LVOT VTI:    0.170 m  AORTA Ao Root diam: 3.40 cm MR Peak grad: 66.9 mmHg MR Vmax:      409.00 cm/s SHUNTS                           Systemic VTI:  0.17 m  Systemic Diam: 2.40 cm Loralie Champagne MD Electronically signed by Loralie Champagne MD Signature Date/Time: 03/06/2020/4:42:53 PM    Final    ECHO INTRAOPERATIVE TEE  Result Date: 02/20/2020  *INTRAOPERATIVE TRANSESOPHAGEAL REPORT *  Patient Name:   ACESON LABELL Date of Exam: 02/09/2020 Medical Rec #:  [id number]     Height:       75.0 in Accession #:    [id number]    Weight:       205.2 lb Date of Birth:  [birth year]     BSA:          2.22 m Patient Age:    29 years      BP:           88/64 mmHg Patient Gender: M             HR:           71 bpm. Exam Location:  Inpatient Transesophogeal exam was perform intraoperatively during surgical procedure. Patient was closely monitored under general anesthesia during the entirety of examination. Indications:     I50.9* Heart failure (unspecified). Impella removal. Performing Phys: [id number] Z Sefora Tietje Diagnosing Phys: Belenda Cruise Stoltzfus Complications: No known complications during this procedure. POST-OP IMPRESSIONS - Left Ventricle: The left ventricle is unchanged from pre-bypass. - Left Atrial Appendage: The left atrial appendage appears unchanged from pre-bypass. - Aortic Valve: The aortic valve appears unchanged from pre-bypass. - Mitral Valve: The mitral valve appears unchanged from pre-bypass. - Tricuspid Valve: The tricuspid valve appears unchanged from pre-bypass. - Interatrial Septum: The interatrial septum appears unchanged from pre-bypass. - Pericardium: The pericardium appears unchanged from pre-bypass. Impella removed without incident. No new or worsening wall motion or valvular abnormalities. PRE-OP FINDINGS  Left Ventricle: The left ventricle has severely reduced systolic function, with an ejection fraction of 20-25%. The cavity size was severely dilated. There is no  increase in left ventricular wall thickness. Left ventricular diffuse hypokinesis. Impella 5.5 in situ with ~4.4cm distance into LV cavity from aortic annulus. Right Ventricle: The right ventricle has severely reduced systolic function. The cavity was normal. There is no increase in right ventricular wall thickness. Left Atrium: Left atrial size was dilated. The left atrial appendage is well visualized and there is no evidence of thrombus present. Left atrial appendage velocity is normal at greater than 40 cm/s. Right Atrium: Right atrial size was normal in size. Interatrial Septum: No atrial level shunt detected by color flow Doppler. Pericardium: There is no evidence of pericardial effusion. There is no pleural effusion. Mitral Valve: The mitral valve is dilated. No thickening of the mitral valve leaflet. No calcification of the mitral valve leaflet. Mitral valve regurgitation is moderate to severe by color flow Doppler. The MR jet is centrally-directed. There is no evidence of mitral valve vegetation. Pulmonary venous flow shows systolic flow reversal. There is moderate mitral valve regurgitation by PISA measuring 3.08. There is No evidence of mitral stenosis. Tricuspid Valve: The tricuspid valve was normal in structure. Tricuspid valve regurgitation was not visualized by color flow Doppler. There is no evidence of tricuspid valve vegetation. Aortic Valve: The aortic valve is normal in structure. Aortic valve regurgitation was not visualized by color flow Doppler. There is no evidence of aortic valve stenosis. There is no evidence of a vegetation on the aortic valve. Pulmonic Valve: The pulmonic valve was normal in structure, with normal. No evidence of pumonic stenosis. Pulmonic valve regurgitation is not visualized by color flow Doppler. Aorta: The aortic root  and ascending aorta are normal in size and structure. Pulmonary Artery: The pulmonary artery is of normal size. Shunts: No residual ventricular septal  defect is detected by 2D or color Doppler. +--------------+--------++ LEFT VENTRICLE         +--------------+--------++ PLAX 2D                +------------+---------++ +--------------+--------++ 3D Volume EF          LVOT diam:    2.10 cm  +------------+---------++ +--------------+--------++ LV 3D EDV:  340.71 ml LVOT Area:    3.46 cm +------------+---------++ +--------------+--------++ LV 3D ESV:  273.07 ml                        +------------+---------++ +--------------+--------++  +------------+-------++ AORTA               +------------+-------++ Ao Asc diam:2.50 cm +------------+-------++ +----------------+-----------++ MR Peak grad:   59.9 mmHg   +--------------+-------+ +----------------+-----------++ SHUNTS                MR Mean grad:   29.5 mmHg   +--------------+-------+ +----------------+-----------++ Systemic Diam:2.10 cm MR Vmax:        387.00 cm/s +--------------+-------+ +----------------+-----------++ MR Vmean:       241.0 cm/s  +----------------+-----------++ MR PISA:        3.08 cm    +----------------+-----------++ MR PISA Eff ROA:40 mm      +----------------+-----------++ MR PISA Radius: 0.70 cm     +----------------+-----------++  Rochele Pages Electronically signed by Rochele Pages Signature Date/Time: 01/30/2020/2:31:11 PM    Final     Microbiology No results found for this or any previous visit (from the past 240 hour(s)).  Lab Basic Metabolic Panel: No results for input(s): NA, K, CL, CO2, GLUCOSE, BUN, CREATININE, CALCIUM, MG, PHOS in the last 168 hours. Liver Function Tests: No results for input(s): AST, ALT, ALKPHOS, BILITOT, PROT, ALBUMIN in the last 168 hours. No results for input(s): LIPASE, AMYLASE in the last 168 hours. No results for input(s): AMMONIA in the last 168 hours. CBC: No results for input(s): WBC, NEUTROABS, HGB, HCT, MCV, PLT in the last 168 hours. Cardiac Enzymes: No  results for input(s): CKTOTAL, CKMB, CKMBINDEX, TROPONINI in the last 168 hours. Sepsis Labs: No results for input(s): PROCALCITON, WBC, LATICACIDVEN in the last 168 hours.  Procedures/Operations  Impella 5.5 LVAD insertion on 01/26/2020 Redo sternotomy and CABG x4 on 01/17/2020 Chest closure on 01/17/2020 Omental flap repair of the sternum and open gastrostomy and open jejunostomy and open tracheostomy on February 07, 2020 Removal of Impella LVAD on 02/26/2020  [name] 03/17/2020, 10:08 AM

## 2020-03-28 NOTE — Progress Notes (Signed)
This chaplain attempted spiritual care F/U with Pt. wife-[name] by phone after learning of the Pt. death this morning.   A voice mail was left at the number in Epic attempting to close the circle of spiritual care and offer condolences.

## 2020-03-28 NOTE — Progress Notes (Signed)
Chaplain was called bedside to be with wife [name] after pt's died.  Chaplain provided ministry of presence and prayer.  Wife was very talkative and reflective about his life and they were both persons of faith. Rev. Tamsen Snider Pager [phone]

## 2020-03-28 NOTE — Progress Notes (Signed)
[facility] for Heparin Indication: Afib  Allergies  Allergen Reactions  . Orange Fruit Anaphylaxis, Hives and Other (See Comments)    Per Pt- Blisters around lips and Hives all over, also  . Penicillins Anaphylaxis    Did it involve swelling of the face/tongue/throat, SOB, or low BP? Yes Did it involve sudden or severe rash/hives, skin peeling, or any reaction on the inside of your mouth or nose? Yes Did you need to seek medical attention at a hospital or doctor's office? No When did it last happen?childhood If all above answers are "NO", may proceed with cephalosporin use.  [name] [Empagliflozin] Itching  . Basaglar Kwikpen [Insulin Glargine] Nausea And Vomiting    Patient Measurements: Height: 6\' 3"  (190.5 cm) Weight: 83.8 kg (184 lb 11.9 oz) IBW/kg (Calculated) : 84.5 Heparin dosing wt: 101 kg  Vital Signs: Temp: 101.8 F (38.8 C) (02/09 0600) Temp Source: Axillary (02/09 0600) BP: 94/54 (02/09 0600) Pulse Rate: 89 (02/09 0600)  Labs: Recent Labs    03/06/20 0455 03/06/20 1437 03/06/20 1854 03/06/20 2055 03/07/20 0332 03/07/20 1534 03/07/20 2144 04/01/2020 0441  HGB 8.4*  --  9.5*  --  8.8*  --   --  7.7*  HCT 27.8*  --  28.0*  --  28.8*  --   --  26.7*  PLT 205  --   --   --  311  --   --  273  HEPARINUNFRC 0.44   < >  --    < > 0.15* 0.13*  --  <0.10*  CREATININE 1.00   < >  --   --  2.05* 1.56* 1.80* 2.05*   < > = values in this interval not displayed.    Estimated Creatinine Clearance: 50.5 mL/min (A) (by C-G formula based on SCr of 2.05 mg/dL (H)).   Assessment: 48 yoM with hx PAF on apixaban PTA. Pt admitted with shock and required Impella 5.5 support. CABG 12/15 with chest left open, c/b hematoma and significant bleeding. Ultimately heparin started via purge for Impella. DCCV performed 12/16, partial chest closure 1/5, Impella removed 1/21. Heparin resumed for AFib 1/22 and stopped 2/3 due to bleeding from wound  vac. Low dose heparin restarted 2/5.  Acute CVA 2/7. Discussed with neuro and at this time the risk of further cardioembolic events outweighs risk for hemorrhagic transformation.   Heparin level is undetectable this morning on 1400 units/hr. Hgb trending down to 7.7. No s/sx of bleeding or infusion issues. Given current condition and comfort care plan will hold off on making any titrations.   Goal of Therapy:  Heparin level ~0.3  Monitor platelets by anticoagulation protocol: Yes   Plan:  -Continue IV heparin at 1400 units/hr. -Daily heparin level and CBC, s/sx of bleeding   Erin Hearing PharmD., BCPS Clinical Pharmacist 01-Apr-2020 7:46 AM

## 2020-03-28 NOTE — Progress Notes (Signed)
Nutrition Brief Note  Chart reviewed. Pt now transitioning to comfort care.  No further nutrition interventions warranted at this time.  Please re-consult as needed.   Taliesin Hartlage W, RD, LDN, CDCES Registered Dietitian II Certified Diabetes Care and Education Specialist Please refer to [facility] for RD and/or RD on-call/weekend/after hours pager  

## 2020-03-28 NOTE — Progress Notes (Signed)
Pt BP is steadily declining in hemodynamics. BP 85/32 (47). RN called spouse, and was notified that a family friend was going to try and see the patient one last time. Per Spouse request, if the patient does pass away prior to visitation, please prepare body and do not send to morgue until the family friend can see the patient.

## 2020-03-28 NOTE — Plan of Care (Signed)
  Problem: Cardiac: Goal: Ability to achieve and maintain adequate cardiopulmonary perfusion will improve Outcome: Progressing   Problem: Coping: Goal: Level of anxiety will decrease Outcome: Progressing   Problem: Skin Integrity: Goal: Risk for impaired skin integrity will decrease Outcome: Progressing   Problem: Respiratory: Goal: Respiratory status will improve Outcome: Progressing   Problem: Skin Integrity: Goal: Wound healing without signs and symptoms of infection Outcome: Progressing Goal: Risk for impaired skin integrity will decrease Outcome: Progressing   Problem: Urinary Elimination: Goal: Ability to achieve and maintain adequate renal perfusion and functioning will improve Outcome: Progressing   Problem: Respiratory: Goal: Patent airway maintenance will improve Outcome: Progressing   Problem: Education: Goal: Knowledge of General Education information will improve Description: Including pain rating scale, medication(s)/side effects and non-pharmacologic comfort measures Outcome: Not Progressing   Problem: Health Behavior/Discharge Planning: Goal: Ability to manage health-related needs will improve Outcome: Not Progressing   Problem: Clinical Measurements: Goal: Ability to maintain clinical measurements within normal limits will improve Outcome: Not Progressing Goal: Diagnostic test results will improve Outcome: Not Progressing Goal: Respiratory complications will improve Outcome: Not Progressing Goal: Cardiovascular complication will be avoided Outcome: Not Progressing   Problem: Activity: Goal: Risk for activity intolerance will decrease Outcome: Not Progressing   Problem: Education: Goal: Ability to demonstrate management of disease process will improve Outcome: Not Progressing Goal: Ability to verbalize understanding of medication therapies will improve Outcome: Not Progressing   Problem: Activity: Goal: Capacity to carry out activities will  improve Outcome: Not Progressing   Problem: Cardiac: Goal: Ability to achieve and maintain adequate cardiopulmonary perfusion will improve Outcome: Not Progressing   Problem: Education: Goal: Ability to verbalize understanding of medication therapies will improve Outcome: Not Progressing   Problem: Activity: Goal: Capacity to carry out activities will improve Outcome: Not Progressing   Problem: Cardiac: Goal: Ability to achieve and maintain adequate cardiopulmonary perfusion will improve Outcome: Not Progressing   Problem: Activity: Goal: Risk for activity intolerance will decrease Outcome: Not Progressing   Problem: Cardiac: Goal: Will achieve and/or maintain hemodynamic stability Outcome: Not Progressing   Problem: Education: Goal: Knowledge about tracheostomy care/management will improve Outcome: Not Progressing   Problem: Activity: Goal: Ability to tolerate increased activity will improve Outcome: Not Progressing   Problem: Health Behavior/Discharge Planning: Goal: Ability to manage tracheostomy will improve Outcome: Not Progressing   Problem: Role Relationship: Goal: Ability to communicate will improve Outcome: Not Progressing   Problem: Education: Goal: Will demonstrate proper wound care and an understanding of methods to prevent future damage Outcome: Not Applicable Goal: Knowledge of disease or condition will improve Outcome: Not Applicable Goal: Knowledge of the prescribed therapeutic regimen will improve Outcome: Not Applicable Goal: Individualized Educational Video(s) Outcome: Not Applicable   Problem: Clinical Measurements: Goal: Postoperative complications will be avoided or minimized Outcome: Not Applicable

## 2020-03-28 NOTE — Progress Notes (Signed)
Expiration Note: Pt expired at 0734.  Pronounced w/Princess,RN.  No breath sounds or heart rhythm/tones auscultated x1 full minute. Wife [name] at bedside at time of expiration. Chaplain now present in room for support.

## 2020-03-28 NOTE — Progress Notes (Incomplete)
{  CH[facility] IP ALL NUTRITION NOTES:[id number]} 

## 2020-03-28 DEATH — deceased

## 2022-08-25 IMAGING — DX DG CHEST 1V
1 series · 1 of 1 positions shown · non-contrast
Comparison: 01/09/2020 and prior

CLINICAL DATA: History of open-heart surgery

EXAM:
CHEST  1 VIEW

[chest ap]
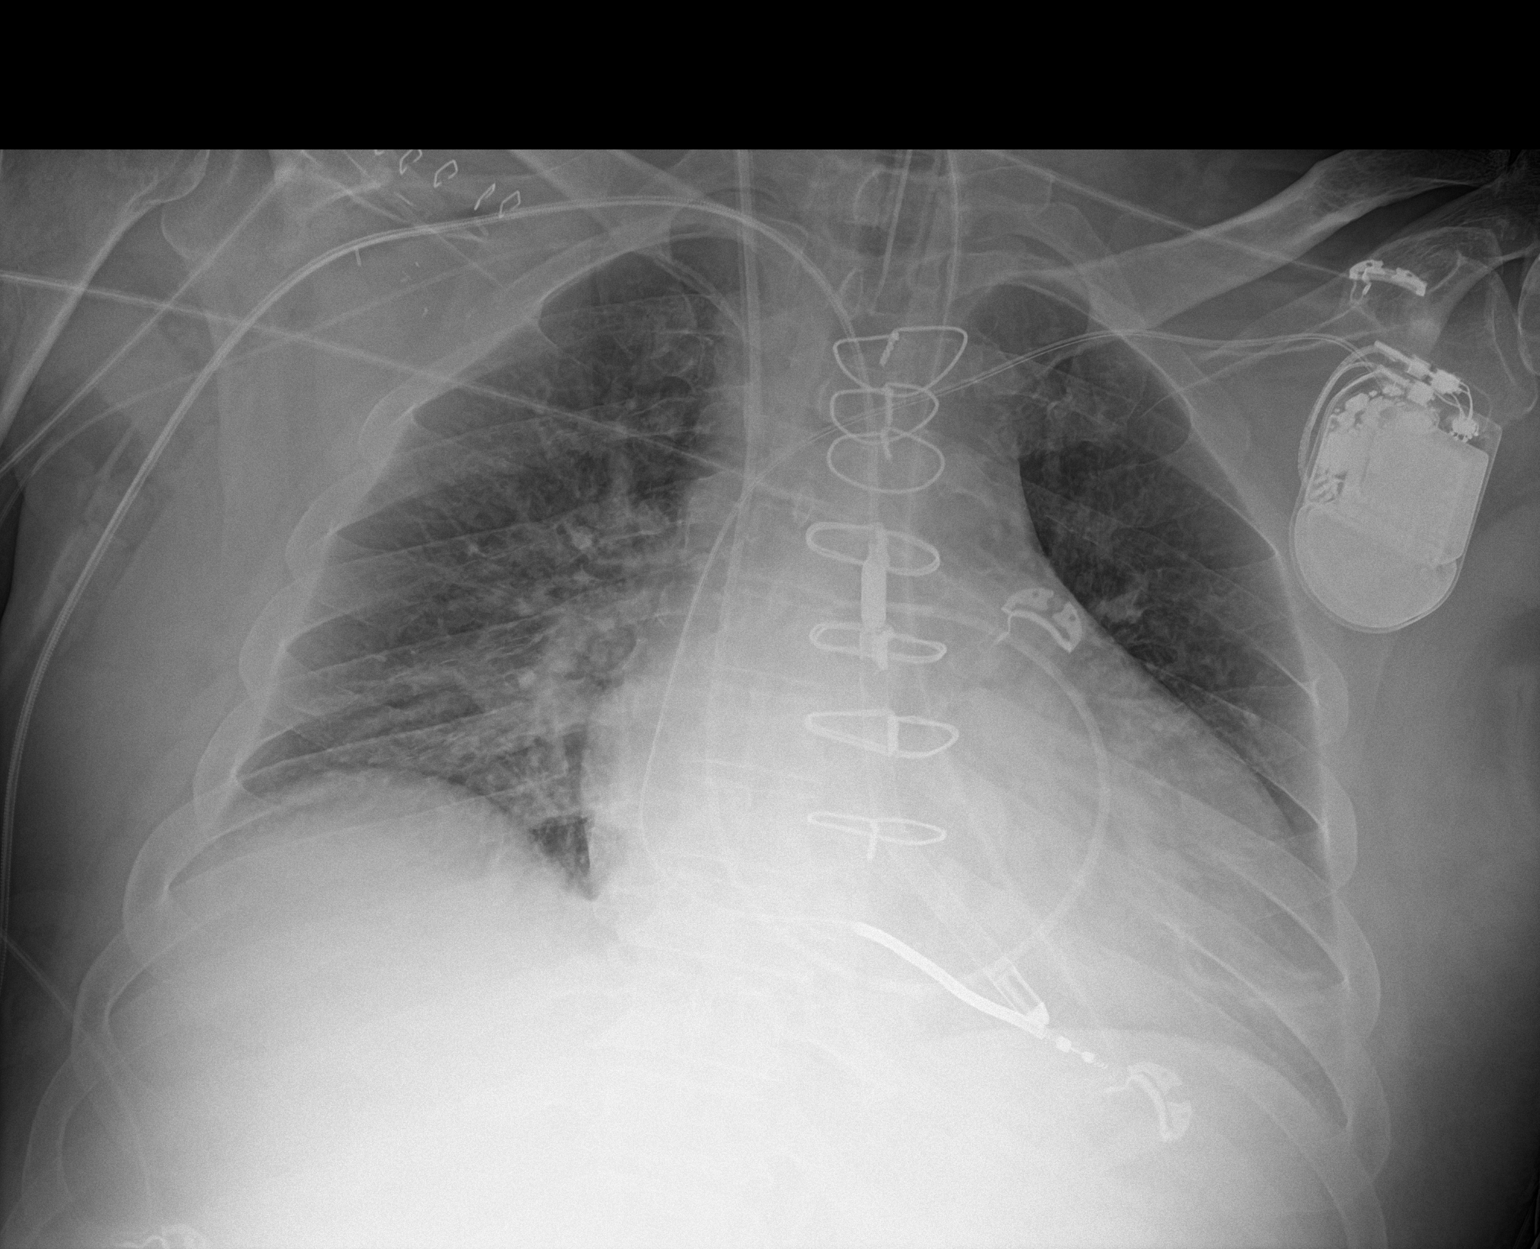

[1 of 1 positions shown; findings below may reference images not displayed]

FINDINGS: Stable support lines and tubes. No pneumothorax or pleural effusion.
Cardiomegaly and central pulmonary vascular congestion, unchanged.
Minimal bibasilar opacities are also unchanged.
IMPRESSION: Unchanged appearance of the chest.

Stable support lines and tubes.

## 2022-08-25 IMAGING — DX DG CHEST 1V PORT
1 series · 2 of 2 positions shown · non-contrast
Comparison: January 08, 2020.

CLINICAL DATA: Congestive heart failure.

EXAM:
PORTABLE CHEST 1 VIEW

[Series 1: chest ap · 0.14mm/px · 2 of 2 slices shown]
[im 1/2]
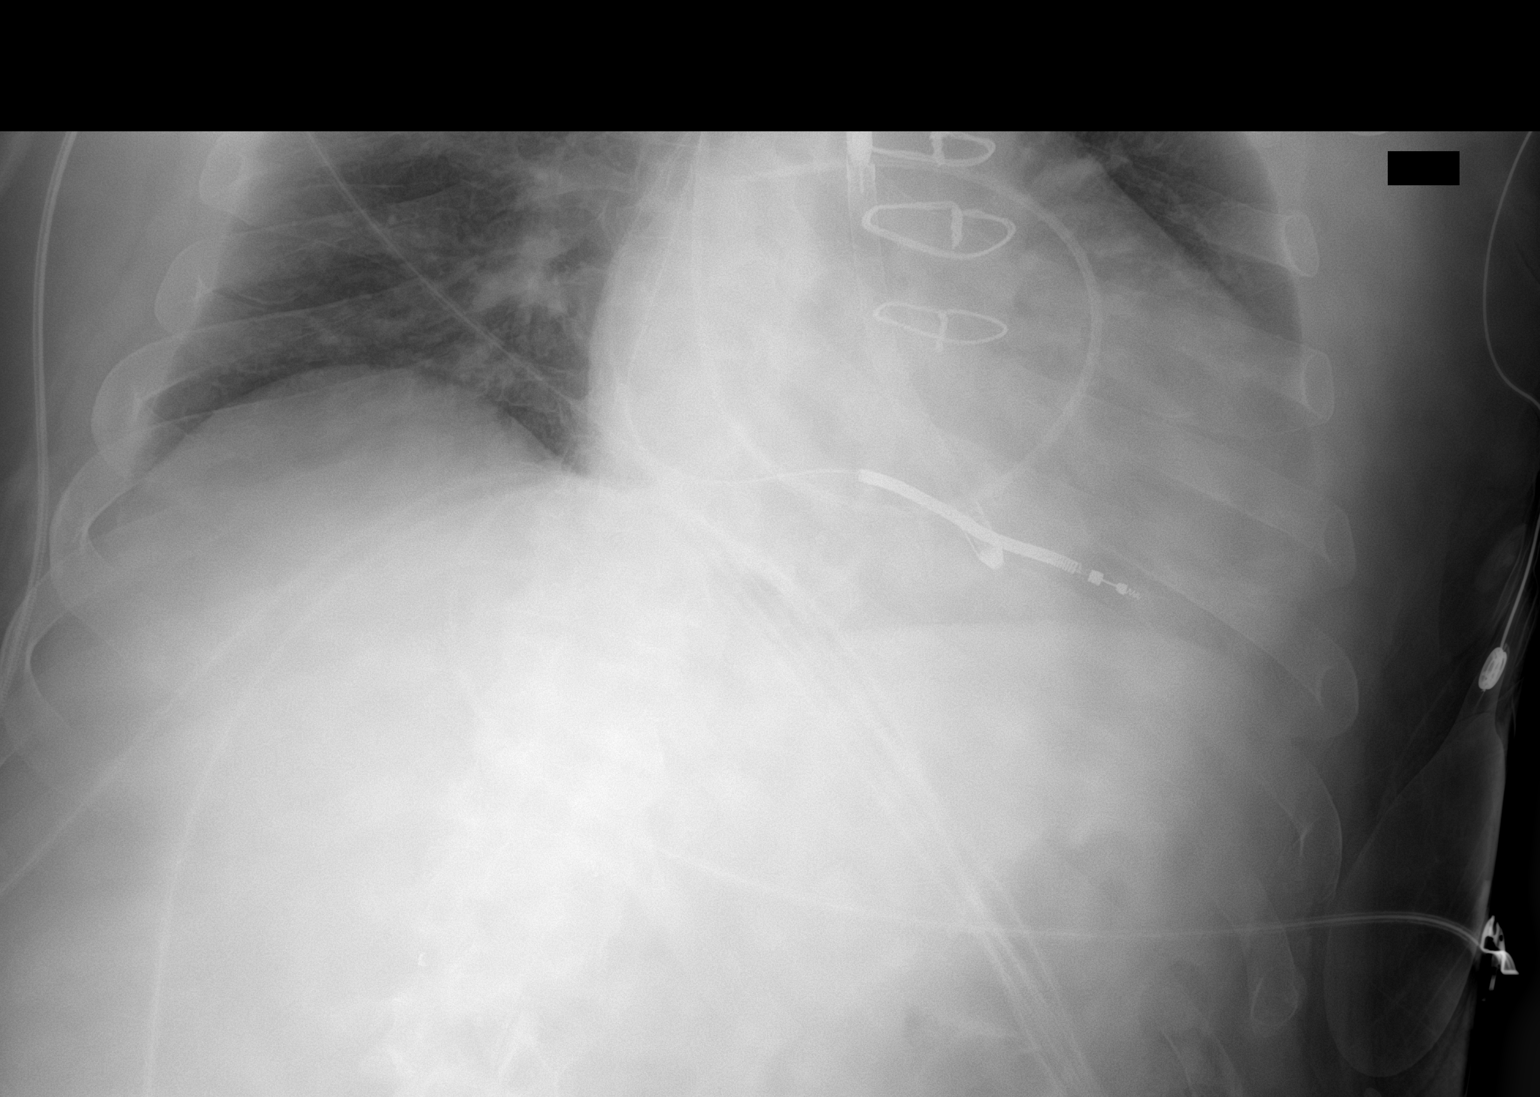
[im 2/2]
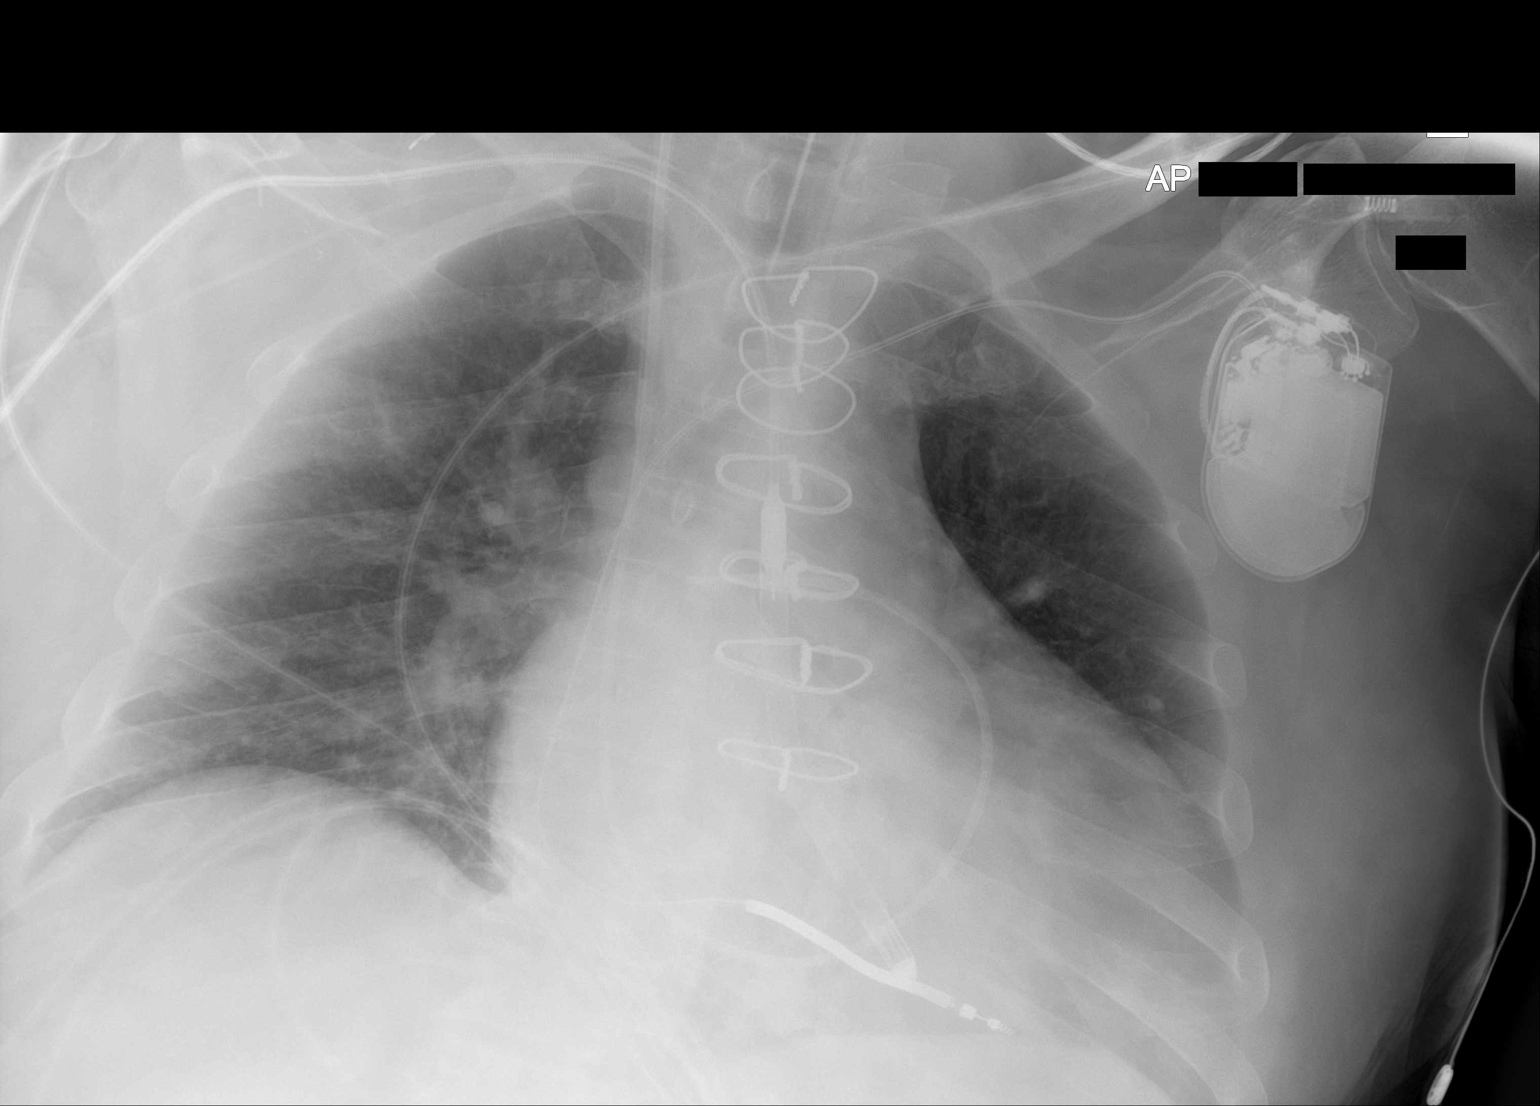

[2 of 2 positions shown; findings below may reference images not displayed]

FINDINGS: Stable cardiomegaly. Endotracheal tube is unchanged in position.
Right internal jugular Swan-Ganz catheter and Impella device are
unchanged in position. Left-sided pacemaker is unchanged. No
pneumothorax or pleural effusion is noted. Lungs are clear. Bony
thorax is unremarkable.
IMPRESSION: Stable support apparatus. No acute cardiopulmonary abnormality seen.

## 2022-08-26 IMAGING — DX DG CHEST 1V
1 series · 2 of 2 positions shown · non-contrast
Comparison: January 09, 2020

CLINICAL DATA: Chest pain.  Status post extubation

EXAM:
CHEST  1 VIEW

[Series 1: chest ap · 0.14mm/px · 2 of 2 slices shown]
[im 1/2]
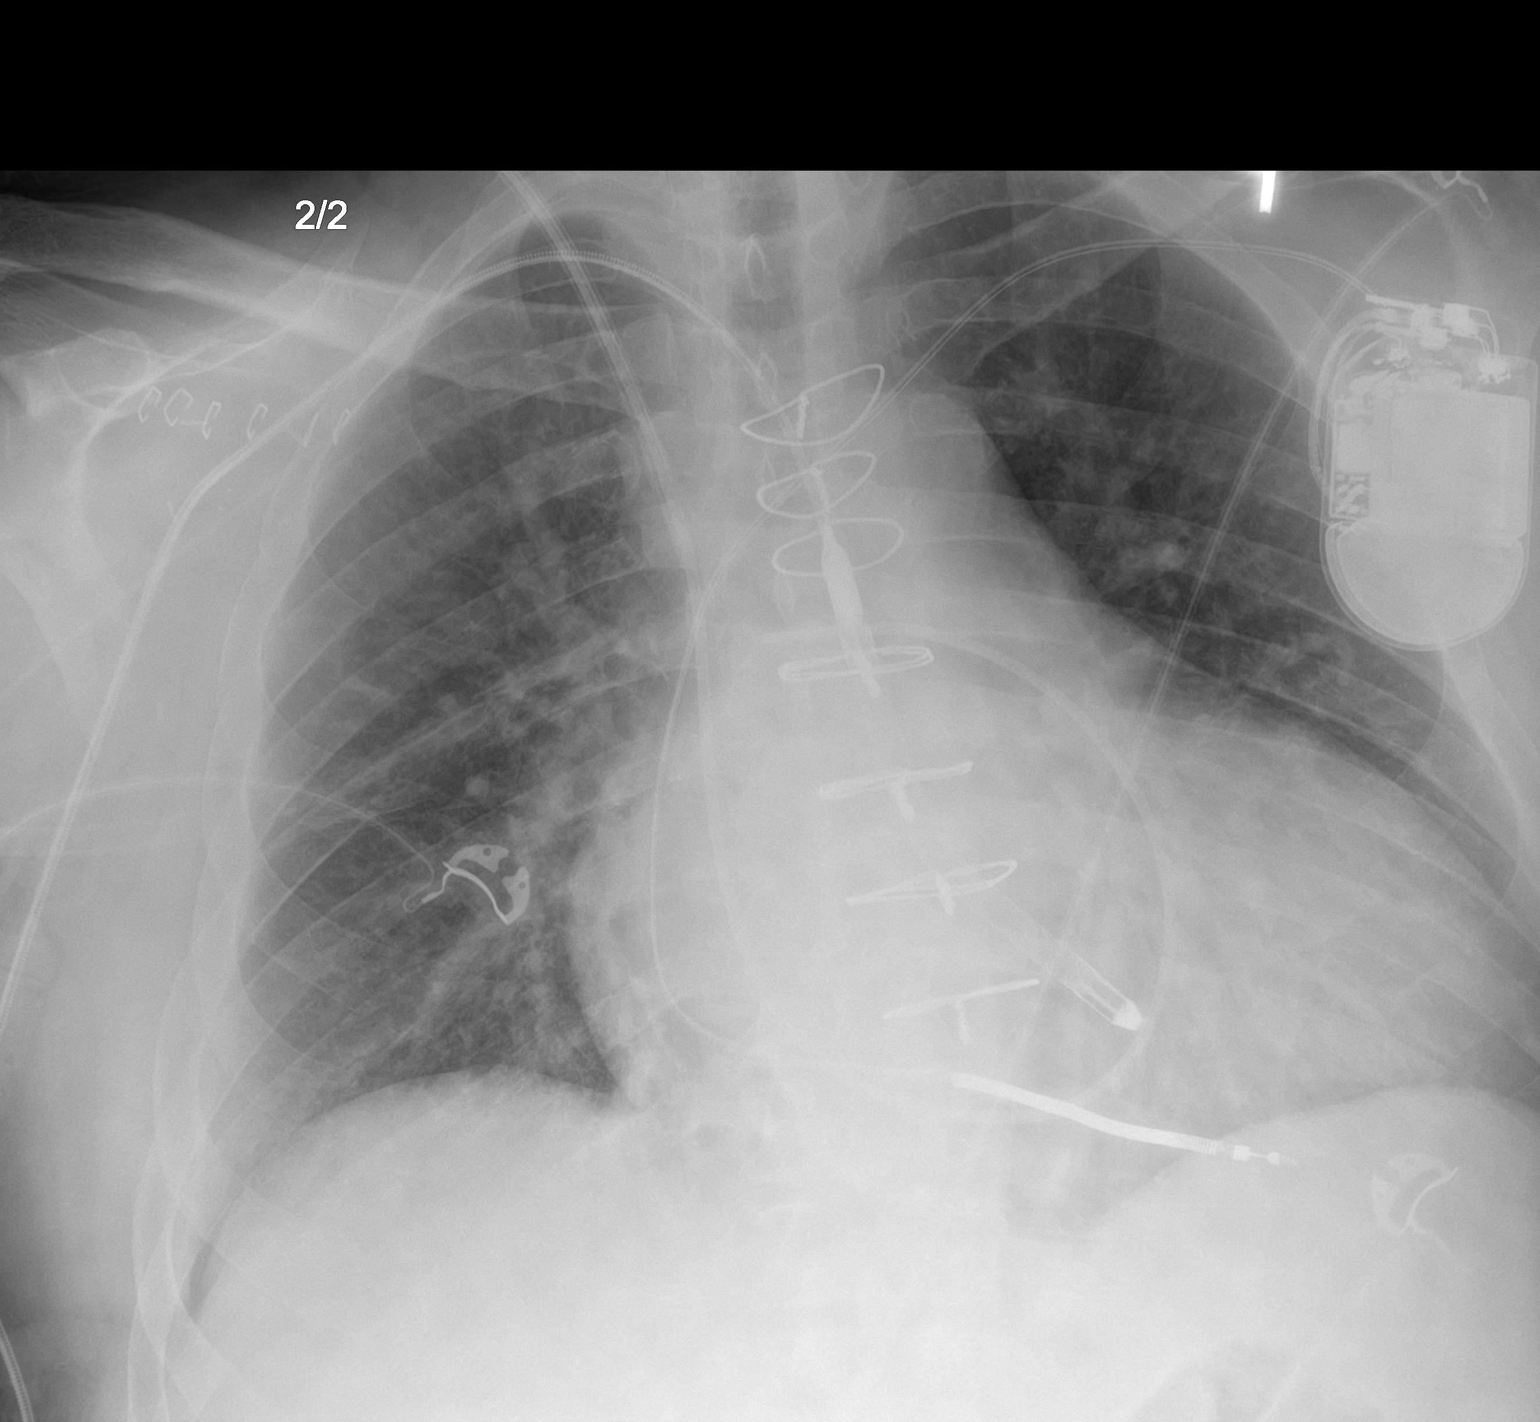
[im 2/2]
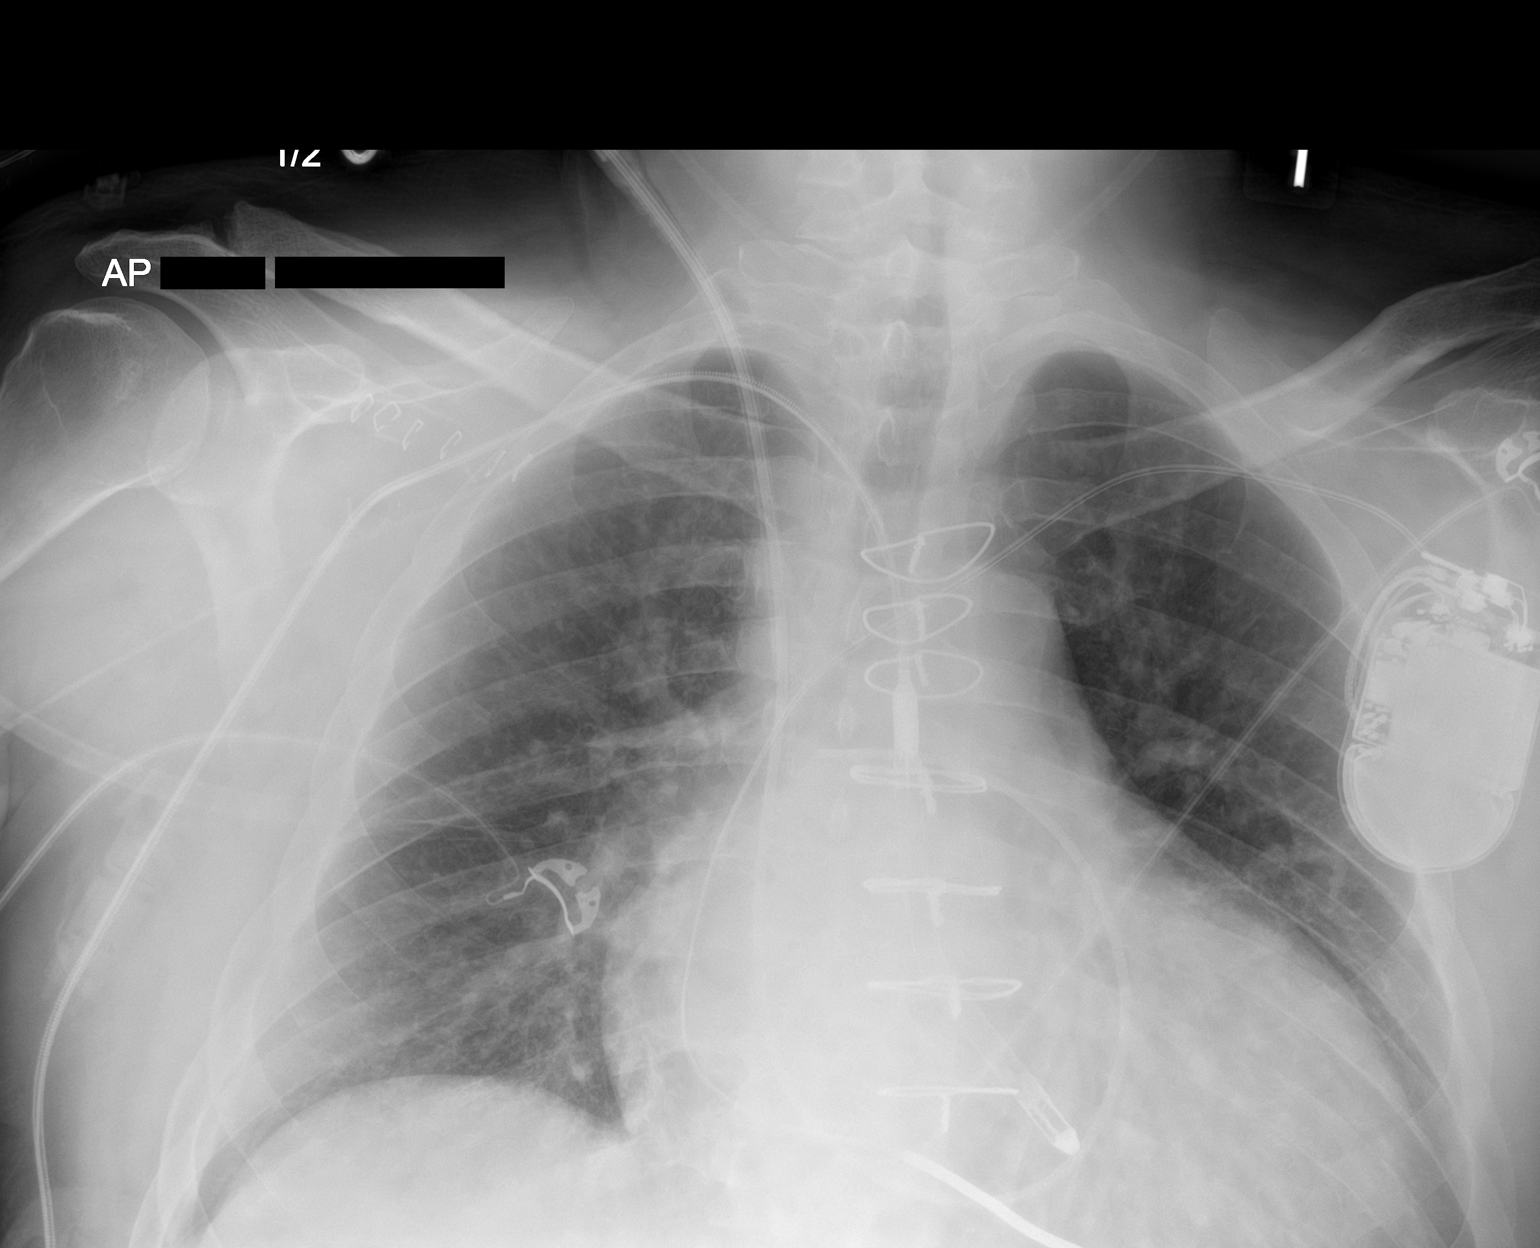

[2 of 2 positions shown; findings below may reference images not displayed]

FINDINGS: Endotracheal tube has been removed. Impella device unchanged in
position. Pacemaker lead attached to right ventricle. Swan-Ganz
catheter tip is in the right main pulmonary artery. No appreciable
pneumothorax. There is no edema or airspace opacity. There is
cardiomegaly with pulmonary vascularity normal. No adenopathy. No
bone lesions.
IMPRESSION: Tube and catheter positions as described without pneumothorax.
Stable cardiomegaly. No edema or airspace opacity.

## 2022-08-28 IMAGING — DX DG CHEST 1V PORT
1 series · 1 of 1 positions shown · non-contrast
Comparison: Radiograph yesterday.

CLINICAL DATA: Post CABG.

EXAM:
PORTABLE CHEST 1 VIEW

[chest ap]
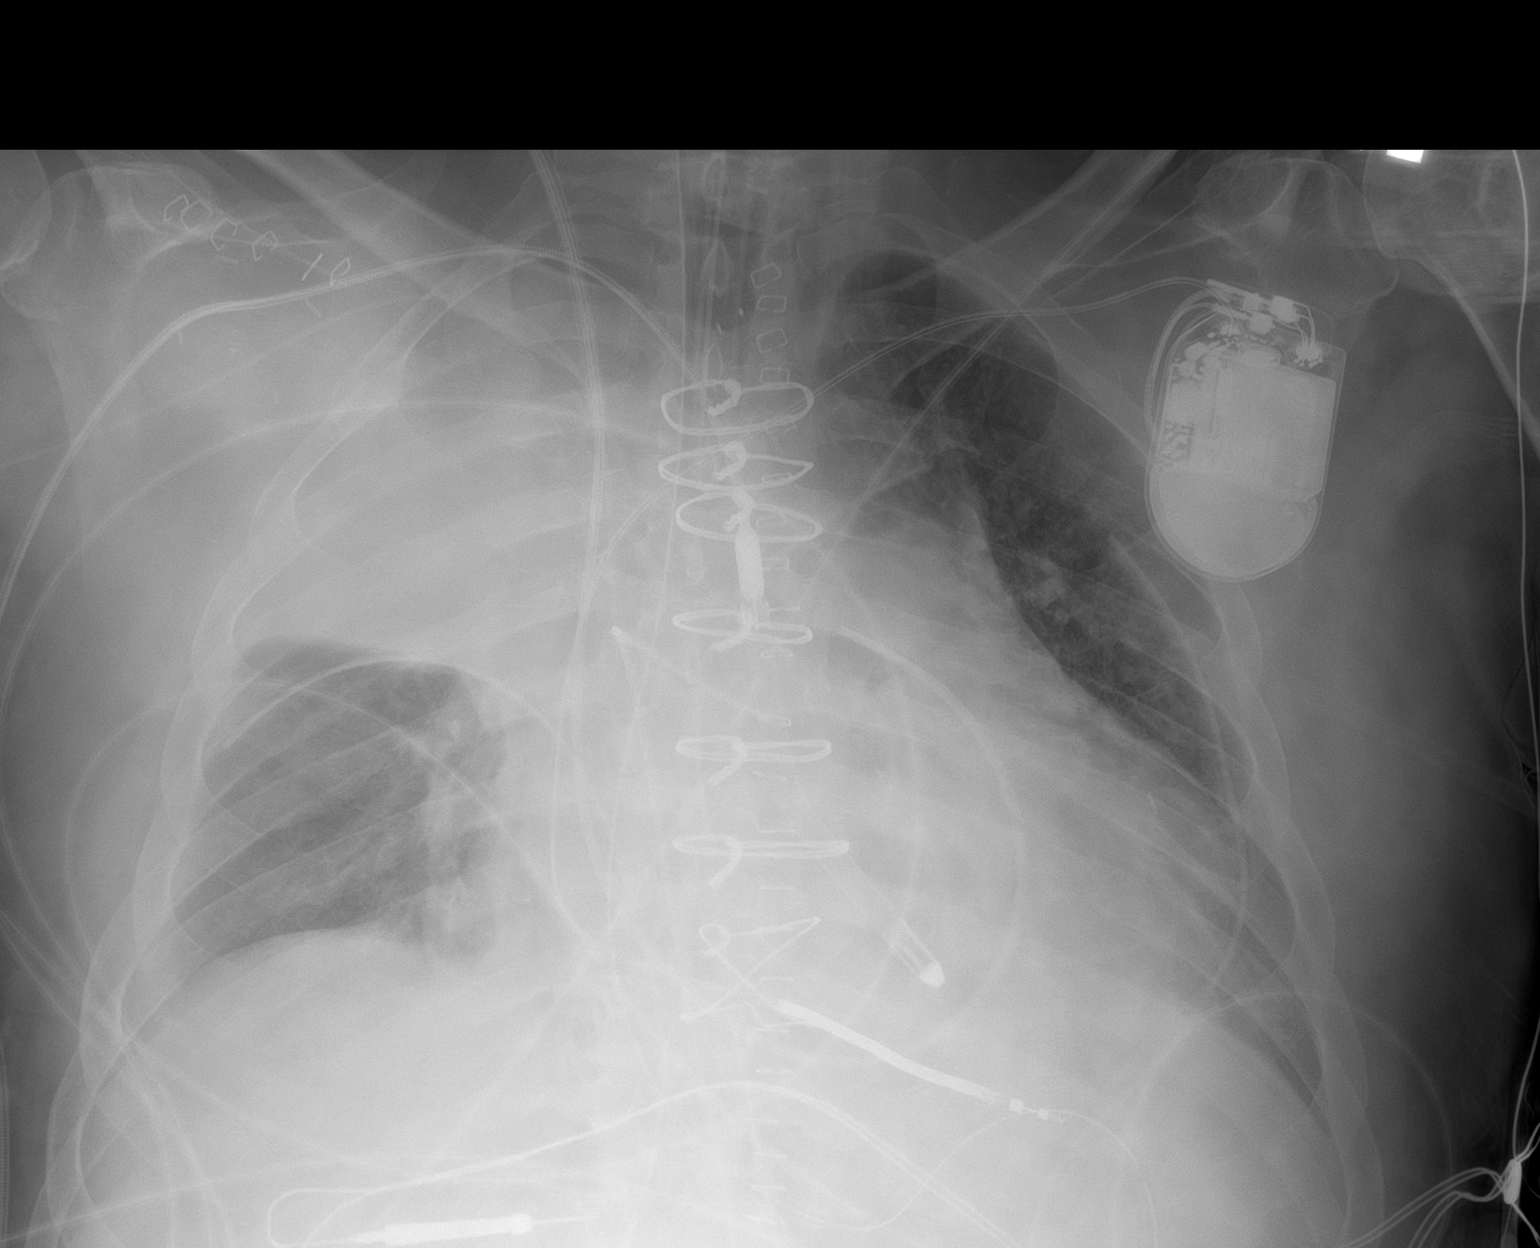

[1 of 1 positions shown; findings below may reference images not displayed]

FINDINGS: Endotracheal tube tip is partially obscured but is approximately
cm from the carina. Enteric tube in place with tip and side-port
below the diaphragm in the stomach. Impella from right subclavian
approach again seen, unchanged in position with tip projecting over
the left ventricle. Right internal jugular Swan-Ganz catheter tip in
the region of the main pulmonary outflow tract. Mediastinal drains
and left-sided chest tube in place. Left-sided pacemaker.

Median sternotomy. New midline skin staples. There is new right
upper lobe collapse. Cardiomegaly is similar to prior. Vascular
congestion. No large pleural effusion. Streaky retrocardiac
atelectasis.
IMPRESSION: 1. New right upper lobe collapse.
2. Endotracheal tube tip partially obscured but is approximately
cm from the carina. Enteric tube in place with tip and side-port
below the diaphragm in the stomach. Remaining support apparatus
unchanged.
3. Cardiomegaly and vascular congestion.

## 2022-09-01 IMAGING — DX DG CHEST 1V PORT
1 series · 1 of 1 positions shown · non-contrast
Comparison: January 15, 2020

CLINICAL DATA: Chest tube in place.

EXAM:
PORTABLE CHEST 1 VIEW

[chest]
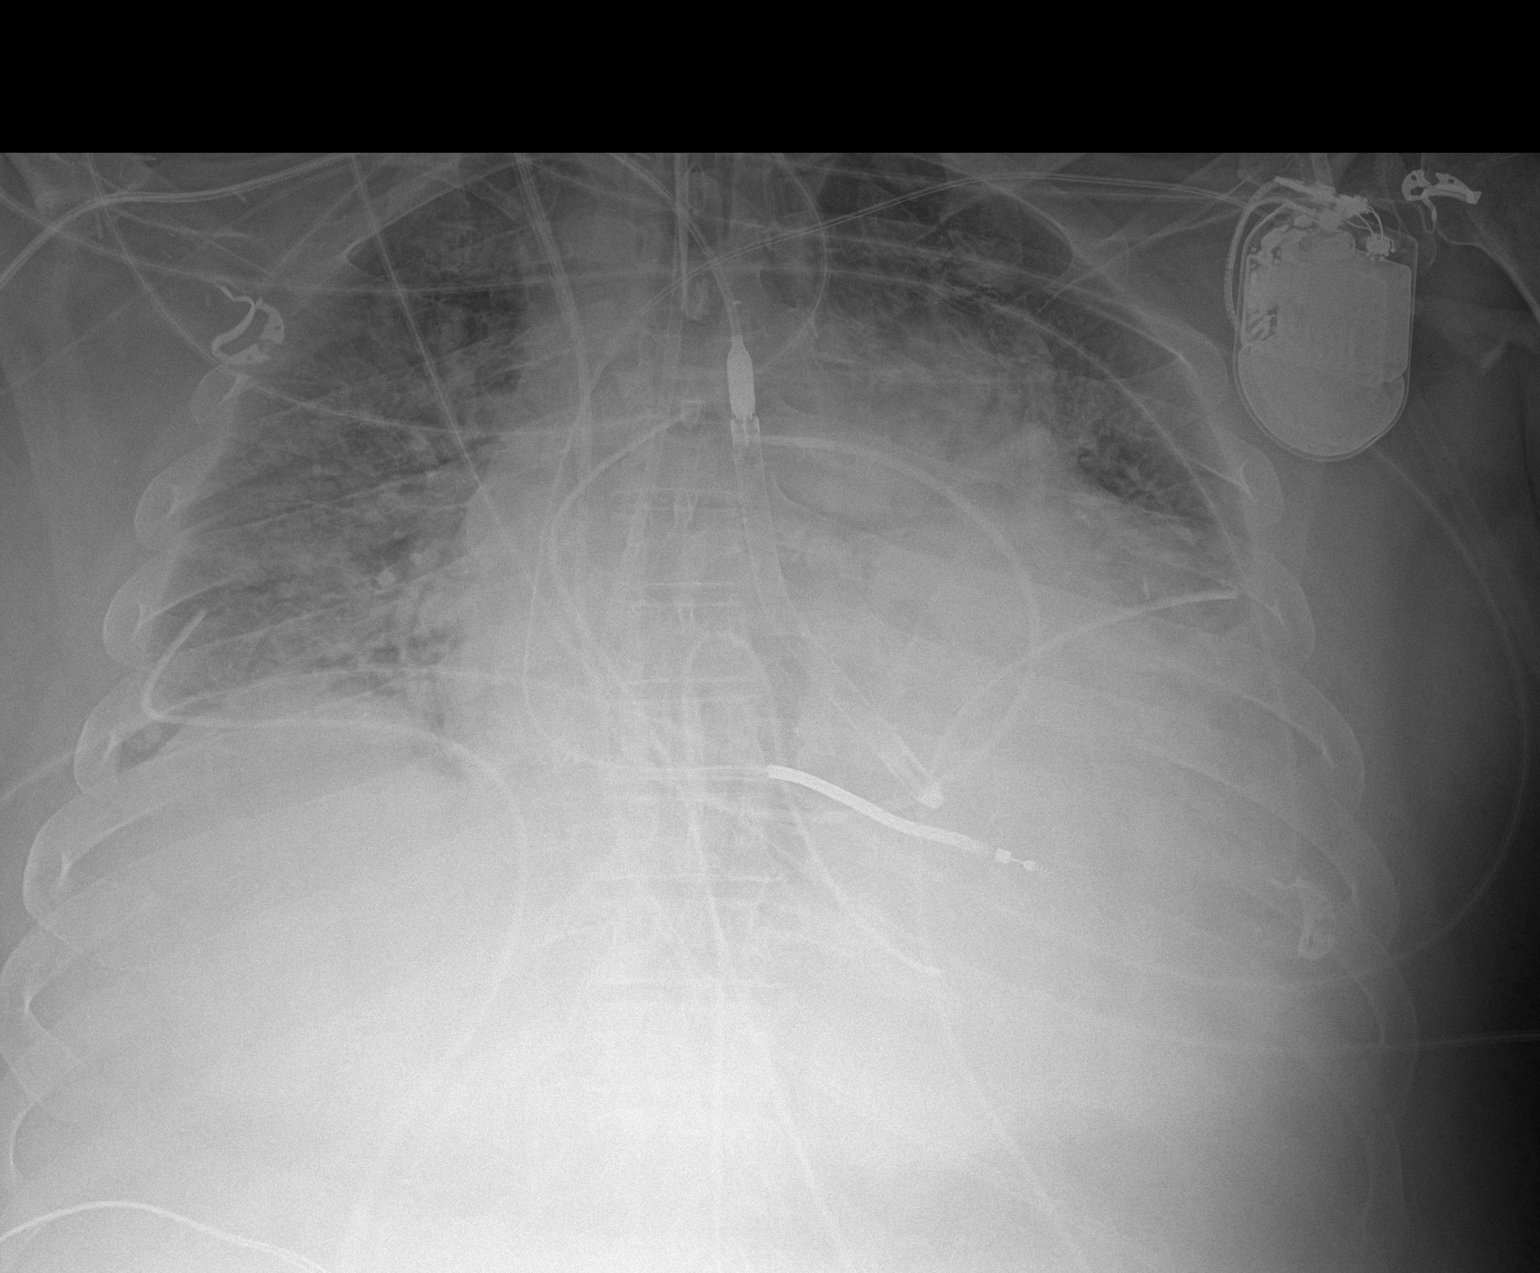

[1 of 1 positions shown; findings below may reference images not displayed]

FINDINGS: The ETT is in good position. The feeding tube terminates below
today's film. An acid the device remains. An Impella device is in
stable position. Chest tubes are stable. No pneumothorax.
Cardiomegaly. Opacity in left base may simply represent atelectasis.
Stable mild congestive heart failure.
IMPRESSION: 1. Support apparatus as above.
2. Persistent cardiomegaly and mild congestive heart failure.
3. Left basilar opacity, likely atelectasis.
